# Patient Record
Sex: Female | Born: 1975
Health system: Southern US, Community
[De-identification: ages and names within clinical notes are randomized; demographics above are authoritative.]

## PROBLEM LIST (undated history)

## (undated) ENCOUNTER — Ambulatory Visit: Payer: No Typology Code available for payment source

## (undated) DIAGNOSIS — R569 Unspecified convulsions: Secondary | ICD-10-CM

## (undated) DIAGNOSIS — D509 Iron deficiency anemia, unspecified: Secondary | ICD-10-CM

## (undated) DIAGNOSIS — B373 Candidiasis of vulva and vagina: Secondary | ICD-10-CM

## (undated) DIAGNOSIS — Z872 Personal history of diseases of the skin and subcutaneous tissue: Secondary | ICD-10-CM

## (undated) DIAGNOSIS — J410 Simple chronic bronchitis: Secondary | ICD-10-CM

## (undated) DIAGNOSIS — Z8711 Personal history of peptic ulcer disease: Secondary | ICD-10-CM

## (undated) DIAGNOSIS — Z8619 Personal history of other infectious and parasitic diseases: Secondary | ICD-10-CM

## (undated) DIAGNOSIS — Z8679 Personal history of other diseases of the circulatory system: Secondary | ICD-10-CM

## (undated) DIAGNOSIS — A6000 Herpesviral infection of urogenital system, unspecified: Secondary | ICD-10-CM

## (undated) DIAGNOSIS — E785 Hyperlipidemia, unspecified: Secondary | ICD-10-CM

## (undated) DIAGNOSIS — N189 Chronic kidney disease, unspecified: Secondary | ICD-10-CM

## (undated) DIAGNOSIS — T4145XA Adverse effect of unspecified anesthetic, initial encounter: Secondary | ICD-10-CM

## (undated) DIAGNOSIS — T7840XA Allergy, unspecified, initial encounter: Secondary | ICD-10-CM

## (undated) DIAGNOSIS — Z8719 Personal history of other diseases of the digestive system: Secondary | ICD-10-CM

## (undated) DIAGNOSIS — G2581 Restless legs syndrome: Secondary | ICD-10-CM

## (undated) DIAGNOSIS — N201 Calculus of ureter: Secondary | ICD-10-CM

## (undated) DIAGNOSIS — R112 Nausea with vomiting, unspecified: Secondary | ICD-10-CM

## (undated) DIAGNOSIS — F429 Obsessive-compulsive disorder, unspecified: Secondary | ICD-10-CM

## (undated) DIAGNOSIS — F411 Generalized anxiety disorder: Secondary | ICD-10-CM

## (undated) DIAGNOSIS — K449 Diaphragmatic hernia without obstruction or gangrene: Secondary | ICD-10-CM

## (undated) DIAGNOSIS — Z923 Personal history of irradiation: Secondary | ICD-10-CM

## (undated) DIAGNOSIS — Z9889 Other specified postprocedural states: Secondary | ICD-10-CM

## (undated) DIAGNOSIS — Z87442 Personal history of urinary calculi: Secondary | ICD-10-CM

## (undated) DIAGNOSIS — E079 Disorder of thyroid, unspecified: Secondary | ICD-10-CM

## (undated) DIAGNOSIS — R7303 Prediabetes: Secondary | ICD-10-CM

## (undated) DIAGNOSIS — B3731 Acute candidiasis of vulva and vagina: Secondary | ICD-10-CM

## (undated) DIAGNOSIS — I10 Essential (primary) hypertension: Secondary | ICD-10-CM

## (undated) DIAGNOSIS — Z87410 Personal history of cervical dysplasia: Secondary | ICD-10-CM

## (undated) DIAGNOSIS — C801 Malignant (primary) neoplasm, unspecified: Secondary | ICD-10-CM

## (undated) DIAGNOSIS — F1021 Alcohol dependence, in remission: Secondary | ICD-10-CM

## (undated) DIAGNOSIS — E0500 Thyrotoxicosis with diffuse goiter without thyrotoxic crisis or storm: Secondary | ICD-10-CM

## (undated) DIAGNOSIS — K219 Gastro-esophageal reflux disease without esophagitis: Secondary | ICD-10-CM

## (undated) DIAGNOSIS — M1990 Unspecified osteoarthritis, unspecified site: Secondary | ICD-10-CM

## (undated) DIAGNOSIS — D689 Coagulation defect, unspecified: Secondary | ICD-10-CM

## (undated) DIAGNOSIS — F319 Bipolar disorder, unspecified: Secondary | ICD-10-CM

## (undated) DIAGNOSIS — Z8659 Personal history of other mental and behavioral disorders: Secondary | ICD-10-CM

## (undated) DIAGNOSIS — T8859XA Other complications of anesthesia, initial encounter: Secondary | ICD-10-CM

## (undated) DIAGNOSIS — R3915 Urgency of urination: Secondary | ICD-10-CM

## (undated) DIAGNOSIS — F32A Depression, unspecified: Secondary | ICD-10-CM

## (undated) DIAGNOSIS — F4310 Post-traumatic stress disorder, unspecified: Secondary | ICD-10-CM

## (undated) HISTORY — DX: Chronic kidney disease, unspecified: N18.9

## (undated) HISTORY — DX: Diaphragmatic hernia without obstruction or gangrene: K44.9

## (undated) HISTORY — DX: Adverse effect of unspecified anesthetic, initial encounter: T41.45XA

## (undated) HISTORY — DX: Coagulation defect, unspecified: D68.9

## (undated) HISTORY — PX: WISDOM TOOTH EXTRACTION: SHX21

## (undated) HISTORY — PX: KNEE ARTHROSCOPY: SHX127

## (undated) HISTORY — PX: LASER ABLATION OF THE CERVIX: SHX1949

## (undated) HISTORY — DX: Unspecified convulsions: R56.9

## (undated) HISTORY — DX: Other complications of anesthesia, initial encounter: T88.59XA

## (undated) HISTORY — DX: Allergy, unspecified, initial encounter: T78.40XA

## (undated) HISTORY — DX: Personal history of irradiation: Z92.3

## (undated) HISTORY — PX: TUBAL LIGATION: SHX77

## (undated) HISTORY — DX: Hyperlipidemia, unspecified: E78.5

## (undated) HISTORY — PX: COLONOSCOPY: SHX174

## (undated) HISTORY — PX: ESOPHAGOGASTRODUODENOSCOPY: SHX1529

## (undated) NOTE — *Deleted (*Deleted)
Radiation Oncology         [phone]) [phone] ________________________________  Name: ROSELL KHOURI MRN: [id number]  Date: 06/16/2020  DOB: [birth year]  Follow-Up Visit Note  CC: Myles Lipps, MD  Adolphus Birchwood, MD  No diagnosis found.  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  Radiation Treatment Dates: 03/16/2020 through 05/08/2020 Site Technique Total Dose (Gy) Dose per Fx (Gy) Completed Fx Beam Energies  Vulva: Pelvis IMRT 45/45 1.8 25/25 6X  Vulva: Pelvis_Bst IMRT 16.2/16.2 1.8 9/9 6X    Narrative:  The patient returns today for pelvic exam to evaluate ongoing vaginal discharge. She was seen in follow-up last week but did not have time to stay for a pelvic exam.  On review of systems, she reports ***. She denies ***.  ALLERGIES:  is allergic to depakote [divalproex sodium], minocycline, and aspirin.  Meds: Current Outpatient Medications  Medication Sig Dispense Refill  . acetaminophen (TYLENOL) 500 MG tablet Take 1,000 mg by mouth every 6 (six) hours as needed for moderate pain or fever.    . diphenoxylate-atropine (LOMOTIL) 2.5-0.025 MG tablet TAKE 1 TO 2 TABLETS BY MOUTH FOUR TIMES DAILY AS NEEDED FOR DIARRHEA 45 tablet 1  . dronabinol (MARINOL) 2.5 MG capsule Take 1 capsule (2.5 mg total) by mouth 2 (two) times daily before a meal. 60 capsule 0  . gabapentin (NEURONTIN) 300 MG capsule Take 1 capsule (300 mg total) by mouth daily. 90 capsule 3  . lidocaine (XYLOCAINE) 5 % ointment Apply 1 application topically 3 (three) times daily as needed. To affected area 35.44 g 0  . lidocaine-prilocaine (EMLA) cream Apply 1 application topically as needed (to port).    Marland Kitchen lisinopril-hydrochlorothiazide (ZESTORETIC) 20-12.5 MG tablet Take 1 tablet by mouth daily. 90 tablet 1  . LORazepam (ATIVAN) 1 MG tablet Take 1 tablet (1 mg total) by mouth 3 (three) times daily as  needed for anxiety. 90 tablet 2  . Multiple Vitamin (MULTIVITAMIN) tablet Take 1 tablet by mouth daily.    Marland Kitchen OLANZapine (ZYPREXA) 15 MG tablet Take 1 tablet (15 mg total) by mouth at bedtime. 90 tablet 0  . omeprazole (PRILOSEC) 40 MG capsule TAKE 1 CAPSULE BY MOUTH TWO TIMES DAILY BEFORE A MEAL 180 capsule 0  . potassium chloride SA (KLOR-CON) 20 MEQ tablet Take 1 tablet (20 mEq total) by mouth 2 (two) times daily. 60 tablet 2  . predniSONE (DELTASONE) 5 MG tablet Take 1 tablet (5 mg total) by mouth in the morning and at bedtime. Take with food 60 tablet 1  . Probiotic Product (PROBIOTIC DAILY PO) Take 1 tablet by mouth daily.     . promethazine (PHENERGAN) 25 MG tablet Take 1 tablet (25 mg total) by mouth every 6 (six) hours as needed for nausea. 30 tablet 3  . rivaroxaban (XARELTO) 20 MG TABS tablet Take 1 tablet (20 mg total) by mouth daily with supper for 7 days. 7 tablet 0  . topiramate (TOPAMAX) 25 MG tablet TAKE 1 TABLET BY MOUTH AT BEDTIME (Patient taking differently: Take 25 mg by mouth at bedtime. ) 90 tablet 0  . traZODone (DESYREL) 100 MG tablet Take 1 tablet (100 mg total) by mouth at bedtime. 90 tablet 0  . venlafaxine XR (EFFEXOR XR) 37.5 MG 24 hr capsule Take three capsule (112.5 mg total) by mouth daily. 90 capsule 2   No current facility-administered medications for this encounter.  Facility-Administered Medications Ordered in Other Encounters  Medication Dose Route Frequency Provider Last Rate Last Admin  . LORazepam (ATIVAN) injection 0.5 mg  0.5 mg Intravenous Once Ceasar Mons., PA-C        Physical Findings: The patient is in no acute distress. Patient is alert and oriented.  vitals were not taken for this visit.  No significant changes. Lungs are clear to auscultation bilaterally. Heart has regular rate and rhythm. No palpable cervical, supraclavicular, or axillary adenopathy. Abdomen soft, non-tender, normal bowel sounds. On pelvic examination the external  genitalia were unremarkable. A speculum exam was performed. There are no mucosal lesions noted in the vaginal vault. On bimanual and rectovaginal examination there were no pelvic masses appreciated. ***  Lab Findings: Lab Results  Component Value Date   WBC 6.1 06/04/2020   HGB 11.3 (L) 06/04/2020   HCT 33.5 (L) 06/04/2020   MCV 94.6 06/04/2020   PLT 261 06/04/2020    Radiographic Findings: No results found.  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 ***  Plan: The patient continues with neoadjuvant chemotherapy with TCH Perjeta x3 cycles under the care of Dr. Pamelia Hoit. Her next cycle begins on 06/19/2020. She will follow-up with Dr. Andrey Farmer in two months and with radiation oncology in *** months.  ____________________________________   Billie Lade, PhD, MD  This document serves as a record of services personally performed by Antony Blackbird, MD. It was created on his behalf by Nikki Dom, a trained medical scribe. The creation of this record is based on the scribe's personal observations and the provider's statements to them. This document has been checked and approved by the attending provider.

---

## 1998-05-04 ENCOUNTER — Encounter (INDEPENDENT_AMBULATORY_CARE_PROVIDER_SITE_OTHER): Payer: Self-pay | Admitting: Gastroenterology

## 1998-05-08 ENCOUNTER — Encounter (INDEPENDENT_AMBULATORY_CARE_PROVIDER_SITE_OTHER): Payer: Self-pay | Admitting: Gastroenterology

## 1998-05-08 ENCOUNTER — Other Ambulatory Visit: Admission: RE | Admit: 1998-05-08 | Discharge: 1998-05-08 | Payer: Self-pay | Admitting: Gastroenterology

## 2000-01-28 ENCOUNTER — Encounter: Payer: Self-pay | Admitting: *Deleted

## 2000-01-28 ENCOUNTER — Inpatient Hospital Stay (HOSPITAL_COMMUNITY): Admission: EM | Admit: 2000-01-28 | Discharge: 2000-01-28 | Payer: Self-pay | Admitting: *Deleted

## 2000-02-04 ENCOUNTER — Encounter: Payer: Self-pay | Admitting: Emergency Medicine

## 2000-02-04 ENCOUNTER — Emergency Department (HOSPITAL_COMMUNITY): Admission: EM | Admit: 2000-02-04 | Discharge: 2000-02-04 | Payer: Self-pay | Admitting: *Deleted

## 2000-02-13 ENCOUNTER — Emergency Department (HOSPITAL_COMMUNITY): Admission: EM | Admit: 2000-02-13 | Discharge: 2000-02-13 | Payer: Self-pay | Admitting: Emergency Medicine

## 2000-03-22 ENCOUNTER — Emergency Department (HOSPITAL_COMMUNITY): Admission: EM | Admit: 2000-03-22 | Discharge: 2000-03-22 | Payer: Self-pay

## 2000-07-23 ENCOUNTER — Inpatient Hospital Stay (HOSPITAL_COMMUNITY): Admission: AD | Admit: 2000-07-23 | Discharge: 2000-07-23 | Payer: Self-pay | Admitting: Obstetrics & Gynecology

## 2000-07-23 ENCOUNTER — Encounter: Payer: Self-pay | Admitting: Obstetrics & Gynecology

## 2000-12-21 ENCOUNTER — Emergency Department (HOSPITAL_COMMUNITY): Admission: EM | Admit: 2000-12-21 | Discharge: 2000-12-21 | Payer: Self-pay | Admitting: Emergency Medicine

## 2001-06-14 ENCOUNTER — Emergency Department (HOSPITAL_COMMUNITY): Admission: EM | Admit: 2001-06-14 | Discharge: 2001-06-14 | Payer: Self-pay | Admitting: Emergency Medicine

## 2001-06-14 ENCOUNTER — Encounter: Payer: Self-pay | Admitting: Emergency Medicine

## 2001-07-02 ENCOUNTER — Encounter: Payer: Self-pay | Admitting: Family Medicine

## 2001-07-02 ENCOUNTER — Ambulatory Visit (HOSPITAL_COMMUNITY): Admission: RE | Admit: 2001-07-02 | Discharge: 2001-07-02 | Payer: Self-pay | Admitting: Family Medicine

## 2002-02-06 ENCOUNTER — Emergency Department (HOSPITAL_COMMUNITY): Admission: EM | Admit: 2002-02-06 | Discharge: 2002-02-06 | Payer: Self-pay | Admitting: Emergency Medicine

## 2002-03-21 ENCOUNTER — Encounter: Payer: Self-pay | Admitting: Emergency Medicine

## 2002-03-21 ENCOUNTER — Emergency Department (HOSPITAL_COMMUNITY): Admission: EM | Admit: 2002-03-21 | Discharge: 2002-03-21 | Payer: Self-pay | Admitting: Emergency Medicine

## 2002-05-02 ENCOUNTER — Other Ambulatory Visit: Admission: RE | Admit: 2002-05-02 | Discharge: 2002-05-02 | Payer: Self-pay | Admitting: Family Medicine

## 2002-07-15 ENCOUNTER — Emergency Department (HOSPITAL_COMMUNITY): Admission: EM | Admit: 2002-07-15 | Discharge: 2002-07-16 | Payer: Self-pay | Admitting: *Deleted

## 2002-07-16 ENCOUNTER — Encounter: Payer: Self-pay | Admitting: *Deleted

## 2002-10-25 ENCOUNTER — Emergency Department (HOSPITAL_COMMUNITY): Admission: EM | Admit: 2002-10-25 | Discharge: 2002-10-25 | Payer: Self-pay | Admitting: Emergency Medicine

## 2003-01-14 ENCOUNTER — Ambulatory Visit: Admission: RE | Admit: 2003-01-14 | Discharge: 2003-01-14 | Payer: Self-pay | Admitting: Family Medicine

## 2003-01-14 ENCOUNTER — Encounter (INDEPENDENT_AMBULATORY_CARE_PROVIDER_SITE_OTHER): Payer: Self-pay | Admitting: Cardiology

## 2003-05-06 ENCOUNTER — Emergency Department (HOSPITAL_COMMUNITY): Admission: EM | Admit: 2003-05-06 | Discharge: 2003-05-06 | Payer: Self-pay | Admitting: Emergency Medicine

## 2003-10-26 ENCOUNTER — Emergency Department (HOSPITAL_COMMUNITY): Admission: EM | Admit: 2003-10-26 | Discharge: 2003-10-26 | Payer: Self-pay | Admitting: *Deleted

## 2003-12-27 ENCOUNTER — Emergency Department (HOSPITAL_COMMUNITY): Admission: EM | Admit: 2003-12-27 | Discharge: 2003-12-27 | Payer: Self-pay | Admitting: Family Medicine

## 2004-01-22 ENCOUNTER — Emergency Department (HOSPITAL_COMMUNITY): Admission: EM | Admit: 2004-01-22 | Discharge: 2004-01-22 | Payer: Self-pay | Admitting: Emergency Medicine

## 2004-02-17 ENCOUNTER — Inpatient Hospital Stay (HOSPITAL_COMMUNITY): Admission: RE | Admit: 2004-02-17 | Discharge: 2004-02-20 | Payer: Self-pay | Admitting: Psychiatry

## 2004-03-11 ENCOUNTER — Encounter: Admission: RE | Admit: 2004-03-11 | Discharge: 2004-03-11 | Payer: Self-pay | Admitting: Family Medicine

## 2004-05-24 ENCOUNTER — Ambulatory Visit: Payer: Self-pay | Admitting: Family Medicine

## 2004-06-14 ENCOUNTER — Ambulatory Visit: Payer: Self-pay | Admitting: Family Medicine

## 2004-06-14 ENCOUNTER — Other Ambulatory Visit: Admission: RE | Admit: 2004-06-14 | Discharge: 2004-06-14 | Payer: Self-pay | Admitting: Family Medicine

## 2004-08-05 ENCOUNTER — Ambulatory Visit: Payer: Self-pay | Admitting: Family Medicine

## 2004-12-14 ENCOUNTER — Emergency Department (HOSPITAL_COMMUNITY): Admission: EM | Admit: 2004-12-14 | Discharge: 2004-12-15 | Payer: Self-pay | Admitting: Emergency Medicine

## 2004-12-19 ENCOUNTER — Emergency Department (HOSPITAL_COMMUNITY): Admission: EM | Admit: 2004-12-19 | Discharge: 2004-12-19 | Payer: Self-pay | Admitting: Emergency Medicine

## 2004-12-21 ENCOUNTER — Ambulatory Visit: Payer: Self-pay | Admitting: Family Medicine

## 2005-02-04 ENCOUNTER — Ambulatory Visit: Payer: Self-pay | Admitting: Family Medicine

## 2005-02-09 ENCOUNTER — Emergency Department (HOSPITAL_COMMUNITY): Admission: EM | Admit: 2005-02-09 | Discharge: 2005-02-10 | Payer: Self-pay | Admitting: Emergency Medicine

## 2005-02-11 ENCOUNTER — Ambulatory Visit: Payer: Self-pay | Admitting: Family Medicine

## 2005-02-15 ENCOUNTER — Ambulatory Visit: Payer: Self-pay | Admitting: Psychiatry

## 2005-02-15 ENCOUNTER — Inpatient Hospital Stay (HOSPITAL_COMMUNITY): Admission: RE | Admit: 2005-02-15 | Discharge: 2005-02-19 | Payer: Self-pay | Admitting: Psychiatry

## 2005-03-14 ENCOUNTER — Ambulatory Visit: Payer: Self-pay | Admitting: Family Medicine

## 2005-04-25 ENCOUNTER — Ambulatory Visit: Payer: Self-pay | Admitting: Family Medicine

## 2005-05-13 ENCOUNTER — Ambulatory Visit: Payer: Self-pay | Admitting: Family Medicine

## 2005-05-16 ENCOUNTER — Emergency Department (HOSPITAL_COMMUNITY): Admission: EM | Admit: 2005-05-16 | Discharge: 2005-05-17 | Payer: Self-pay | Admitting: Emergency Medicine

## 2005-05-26 ENCOUNTER — Ambulatory Visit: Payer: Self-pay | Admitting: Family Medicine

## 2005-06-03 ENCOUNTER — Emergency Department (HOSPITAL_COMMUNITY): Admission: EM | Admit: 2005-06-03 | Discharge: 2005-06-03 | Payer: Self-pay | Admitting: Emergency Medicine

## 2005-06-20 ENCOUNTER — Emergency Department (HOSPITAL_COMMUNITY): Admission: EM | Admit: 2005-06-20 | Discharge: 2005-06-20 | Payer: Self-pay | Admitting: Emergency Medicine

## 2005-07-01 ENCOUNTER — Emergency Department (HOSPITAL_COMMUNITY): Admission: EM | Admit: 2005-07-01 | Discharge: 2005-07-02 | Payer: Self-pay | Admitting: Emergency Medicine

## 2005-07-06 ENCOUNTER — Ambulatory Visit: Payer: Self-pay | Admitting: Psychiatry

## 2005-07-06 ENCOUNTER — Inpatient Hospital Stay (HOSPITAL_COMMUNITY): Admission: EM | Admit: 2005-07-06 | Discharge: 2005-07-12 | Payer: Self-pay | Admitting: Psychiatry

## 2005-07-20 ENCOUNTER — Ambulatory Visit: Payer: Self-pay | Admitting: Family Medicine

## 2005-08-22 ENCOUNTER — Ambulatory Visit: Payer: Self-pay | Admitting: Family Medicine

## 2005-09-18 ENCOUNTER — Emergency Department (HOSPITAL_COMMUNITY): Admission: EM | Admit: 2005-09-18 | Discharge: 2005-09-18 | Payer: Self-pay | Admitting: Emergency Medicine

## 2005-09-26 ENCOUNTER — Emergency Department (HOSPITAL_COMMUNITY): Admission: EM | Admit: 2005-09-26 | Discharge: 2005-09-26 | Payer: Self-pay | Admitting: Emergency Medicine

## 2005-10-06 ENCOUNTER — Encounter (INDEPENDENT_AMBULATORY_CARE_PROVIDER_SITE_OTHER): Payer: Self-pay | Admitting: Family Medicine

## 2005-10-06 ENCOUNTER — Ambulatory Visit: Payer: Self-pay | Admitting: Family Medicine

## 2005-11-07 ENCOUNTER — Ambulatory Visit: Payer: Self-pay | Admitting: Family Medicine

## 2005-11-10 ENCOUNTER — Emergency Department (HOSPITAL_COMMUNITY): Admission: EM | Admit: 2005-11-10 | Discharge: 2005-11-10 | Payer: Self-pay | Admitting: Emergency Medicine

## 2005-12-03 ENCOUNTER — Emergency Department (HOSPITAL_COMMUNITY): Admission: EM | Admit: 2005-12-03 | Discharge: 2005-12-03 | Payer: Self-pay | Admitting: Emergency Medicine

## 2005-12-08 ENCOUNTER — Ambulatory Visit: Payer: Self-pay | Admitting: Family Medicine

## 2006-01-04 ENCOUNTER — Ambulatory Visit: Payer: Self-pay | Admitting: Family Medicine

## 2006-01-19 ENCOUNTER — Emergency Department (HOSPITAL_COMMUNITY): Admission: EM | Admit: 2006-01-19 | Discharge: 2006-01-19 | Payer: Self-pay | Admitting: Family Medicine

## 2006-02-22 ENCOUNTER — Ambulatory Visit: Payer: Self-pay | Admitting: Family Medicine

## 2006-03-20 ENCOUNTER — Ambulatory Visit: Payer: Self-pay | Admitting: Internal Medicine

## 2006-03-27 ENCOUNTER — Ambulatory Visit: Payer: Self-pay | Admitting: Family Medicine

## 2006-03-29 ENCOUNTER — Ambulatory Visit: Payer: Self-pay | Admitting: Family Medicine

## 2006-04-02 ENCOUNTER — Emergency Department (HOSPITAL_COMMUNITY): Admission: EM | Admit: 2006-04-02 | Discharge: 2006-04-03 | Payer: Self-pay | Admitting: Emergency Medicine

## 2006-05-19 HISTORY — PX: TRANSTHORACIC ECHOCARDIOGRAM: SHX275

## 2006-06-12 ENCOUNTER — Emergency Department (HOSPITAL_COMMUNITY): Admission: EM | Admit: 2006-06-12 | Discharge: 2006-06-12 | Payer: Self-pay | Admitting: Emergency Medicine

## 2006-06-20 ENCOUNTER — Ambulatory Visit: Payer: Self-pay | Admitting: Family Medicine

## 2006-07-14 ENCOUNTER — Ambulatory Visit: Payer: Self-pay | Admitting: Family Medicine

## 2006-08-09 ENCOUNTER — Emergency Department (HOSPITAL_COMMUNITY): Admission: EM | Admit: 2006-08-09 | Discharge: 2006-08-10 | Payer: Self-pay | Admitting: Emergency Medicine

## 2006-09-04 ENCOUNTER — Ambulatory Visit: Payer: Self-pay | Admitting: Family Medicine

## 2006-11-07 ENCOUNTER — Emergency Department (HOSPITAL_COMMUNITY): Admission: EM | Admit: 2006-11-07 | Discharge: 2006-11-07 | Payer: Self-pay | Admitting: Emergency Medicine

## 2006-12-28 ENCOUNTER — Ambulatory Visit: Payer: Self-pay | Admitting: Internal Medicine

## 2007-02-03 ENCOUNTER — Encounter (INDEPENDENT_AMBULATORY_CARE_PROVIDER_SITE_OTHER): Payer: Self-pay | Admitting: Family Medicine

## 2007-02-03 DIAGNOSIS — F1910 Other psychoactive substance abuse, uncomplicated: Secondary | ICD-10-CM | POA: Insufficient documentation

## 2007-02-03 DIAGNOSIS — Z8742 Personal history of other diseases of the female genital tract: Secondary | ICD-10-CM | POA: Insufficient documentation

## 2007-02-03 DIAGNOSIS — F17200 Nicotine dependence, unspecified, uncomplicated: Secondary | ICD-10-CM | POA: Insufficient documentation

## 2007-02-03 DIAGNOSIS — B009 Herpesviral infection, unspecified: Secondary | ICD-10-CM | POA: Insufficient documentation

## 2007-02-03 DIAGNOSIS — F319 Bipolar disorder, unspecified: Secondary | ICD-10-CM

## 2007-04-06 ENCOUNTER — Emergency Department (HOSPITAL_COMMUNITY): Admission: EM | Admit: 2007-04-06 | Discharge: 2007-04-06 | Payer: Self-pay | Admitting: Psychiatry

## 2007-04-16 ENCOUNTER — Emergency Department (HOSPITAL_COMMUNITY): Admission: EM | Admit: 2007-04-16 | Discharge: 2007-04-16 | Payer: Self-pay | Admitting: Emergency Medicine

## 2007-04-19 ENCOUNTER — Ambulatory Visit: Payer: Self-pay | Admitting: Internal Medicine

## 2007-05-21 ENCOUNTER — Emergency Department (HOSPITAL_COMMUNITY): Admission: EM | Admit: 2007-05-21 | Discharge: 2007-05-22 | Payer: Self-pay | Admitting: Emergency Medicine

## 2007-05-24 ENCOUNTER — Emergency Department (HOSPITAL_COMMUNITY): Admission: EM | Admit: 2007-05-24 | Discharge: 2007-05-24 | Payer: Self-pay | Admitting: Emergency Medicine

## 2007-05-26 ENCOUNTER — Emergency Department (HOSPITAL_COMMUNITY): Admission: EM | Admit: 2007-05-26 | Discharge: 2007-05-26 | Payer: Self-pay | Admitting: Family Medicine

## 2007-06-02 ENCOUNTER — Emergency Department (HOSPITAL_COMMUNITY): Admission: EM | Admit: 2007-06-02 | Discharge: 2007-06-02 | Payer: Self-pay | Admitting: Emergency Medicine

## 2007-06-11 ENCOUNTER — Ambulatory Visit: Payer: Self-pay | Admitting: Internal Medicine

## 2007-06-25 ENCOUNTER — Ambulatory Visit: Payer: Self-pay | Admitting: Family Medicine

## 2007-07-03 ENCOUNTER — Other Ambulatory Visit: Payer: Self-pay | Admitting: Emergency Medicine

## 2007-07-04 ENCOUNTER — Inpatient Hospital Stay (HOSPITAL_COMMUNITY): Admission: AD | Admit: 2007-07-04 | Discharge: 2007-07-09 | Payer: Self-pay | Admitting: Psychiatry

## 2007-07-04 ENCOUNTER — Ambulatory Visit: Payer: Self-pay | Admitting: Psychiatry

## 2007-07-14 ENCOUNTER — Emergency Department (HOSPITAL_COMMUNITY): Admission: EM | Admit: 2007-07-14 | Discharge: 2007-07-14 | Payer: Self-pay | Admitting: Emergency Medicine

## 2007-07-27 ENCOUNTER — Ambulatory Visit: Payer: Self-pay | Admitting: Family Medicine

## 2007-07-27 ENCOUNTER — Encounter (INDEPENDENT_AMBULATORY_CARE_PROVIDER_SITE_OTHER): Payer: Self-pay | Admitting: Family Medicine

## 2007-07-27 LAB — CONVERTED CEMR LAB
Chlamydia, DNA Probe: NEGATIVE
GC Probe Amp, Genital: NEGATIVE

## 2007-08-01 ENCOUNTER — Ambulatory Visit (HOSPITAL_COMMUNITY): Payer: Self-pay | Admitting: Psychiatry

## 2007-08-23 ENCOUNTER — Ambulatory Visit (HOSPITAL_COMMUNITY): Payer: Self-pay | Admitting: Licensed Clinical Social Worker

## 2007-08-24 ENCOUNTER — Ambulatory Visit (HOSPITAL_COMMUNITY): Payer: Self-pay | Admitting: Psychiatry

## 2007-09-11 ENCOUNTER — Ambulatory Visit (HOSPITAL_COMMUNITY): Payer: Self-pay | Admitting: Licensed Clinical Social Worker

## 2007-09-17 ENCOUNTER — Ambulatory Visit (HOSPITAL_COMMUNITY): Payer: Self-pay | Admitting: Psychiatry

## 2007-09-20 ENCOUNTER — Ambulatory Visit (HOSPITAL_COMMUNITY): Payer: Self-pay | Admitting: Licensed Clinical Social Worker

## 2007-09-26 ENCOUNTER — Emergency Department (HOSPITAL_COMMUNITY): Admission: EM | Admit: 2007-09-26 | Discharge: 2007-09-26 | Payer: Self-pay | Admitting: Emergency Medicine

## 2007-09-27 ENCOUNTER — Ambulatory Visit (HOSPITAL_COMMUNITY): Payer: Self-pay | Admitting: Licensed Clinical Social Worker

## 2007-10-04 ENCOUNTER — Ambulatory Visit (HOSPITAL_COMMUNITY): Payer: Self-pay | Admitting: Licensed Clinical Social Worker

## 2007-10-10 ENCOUNTER — Ambulatory Visit (HOSPITAL_COMMUNITY): Payer: Self-pay | Admitting: Psychiatry

## 2007-10-16 ENCOUNTER — Ambulatory Visit (HOSPITAL_COMMUNITY): Payer: Self-pay | Admitting: Licensed Clinical Social Worker

## 2007-10-23 ENCOUNTER — Ambulatory Visit: Payer: Self-pay | Admitting: Family Medicine

## 2007-10-24 ENCOUNTER — Emergency Department (HOSPITAL_COMMUNITY): Admission: EM | Admit: 2007-10-24 | Discharge: 2007-10-24 | Payer: Self-pay | Admitting: Emergency Medicine

## 2007-11-12 ENCOUNTER — Ambulatory Visit (HOSPITAL_COMMUNITY): Payer: Self-pay | Admitting: Licensed Clinical Social Worker

## 2007-11-14 ENCOUNTER — Other Ambulatory Visit (HOSPITAL_COMMUNITY): Admission: RE | Admit: 2007-11-14 | Discharge: 2007-11-23 | Payer: Self-pay | Admitting: Psychiatry

## 2007-11-16 ENCOUNTER — Ambulatory Visit: Payer: Self-pay | Admitting: Psychiatry

## 2007-12-04 ENCOUNTER — Ambulatory Visit (HOSPITAL_COMMUNITY): Payer: Self-pay | Admitting: Licensed Clinical Social Worker

## 2007-12-10 ENCOUNTER — Other Ambulatory Visit (HOSPITAL_COMMUNITY): Admission: RE | Admit: 2007-12-10 | Discharge: 2007-12-31 | Payer: Self-pay | Admitting: Psychiatry

## 2007-12-11 ENCOUNTER — Emergency Department (HOSPITAL_COMMUNITY): Admission: EM | Admit: 2007-12-11 | Discharge: 2007-12-11 | Payer: Self-pay | Admitting: Emergency Medicine

## 2007-12-13 ENCOUNTER — Ambulatory Visit: Payer: Self-pay | Admitting: Psychiatry

## 2007-12-20 ENCOUNTER — Ambulatory Visit (HOSPITAL_COMMUNITY): Payer: Self-pay | Admitting: Licensed Clinical Social Worker

## 2007-12-28 ENCOUNTER — Ambulatory Visit: Payer: Self-pay | Admitting: Internal Medicine

## 2007-12-31 ENCOUNTER — Ambulatory Visit: Payer: Self-pay | Admitting: Internal Medicine

## 2008-01-16 ENCOUNTER — Emergency Department (HOSPITAL_COMMUNITY): Admission: EM | Admit: 2008-01-16 | Discharge: 2008-01-16 | Payer: Self-pay | Admitting: Emergency Medicine

## 2008-01-30 ENCOUNTER — Ambulatory Visit (HOSPITAL_COMMUNITY): Payer: Self-pay | Admitting: Psychiatry

## 2008-02-07 ENCOUNTER — Ambulatory Visit: Payer: Self-pay | Admitting: Family Medicine

## 2008-02-12 ENCOUNTER — Ambulatory Visit (HOSPITAL_COMMUNITY): Payer: Self-pay | Admitting: Licensed Clinical Social Worker

## 2008-02-13 ENCOUNTER — Ambulatory Visit (HOSPITAL_COMMUNITY): Payer: Self-pay | Admitting: Psychiatry

## 2008-02-13 ENCOUNTER — Ambulatory Visit (HOSPITAL_COMMUNITY): Admission: RE | Admit: 2008-02-13 | Discharge: 2008-02-13 | Payer: Self-pay | Admitting: Family Medicine

## 2008-02-29 ENCOUNTER — Ambulatory Visit: Payer: Self-pay | Admitting: Family Medicine

## 2008-03-05 ENCOUNTER — Ambulatory Visit (HOSPITAL_COMMUNITY): Payer: Self-pay | Admitting: Psychiatry

## 2008-03-19 ENCOUNTER — Ambulatory Visit (HOSPITAL_COMMUNITY): Payer: Self-pay | Admitting: Licensed Clinical Social Worker

## 2008-03-25 ENCOUNTER — Ambulatory Visit (HOSPITAL_COMMUNITY): Payer: Self-pay | Admitting: Licensed Clinical Social Worker

## 2008-04-01 ENCOUNTER — Emergency Department (HOSPITAL_COMMUNITY): Admission: EM | Admit: 2008-04-01 | Discharge: 2008-04-01 | Payer: Self-pay | Admitting: Emergency Medicine

## 2008-04-03 ENCOUNTER — Ambulatory Visit (HOSPITAL_COMMUNITY): Payer: Self-pay | Admitting: Licensed Clinical Social Worker

## 2008-04-16 ENCOUNTER — Ambulatory Visit (HOSPITAL_COMMUNITY): Payer: Self-pay | Admitting: Psychiatry

## 2008-05-02 ENCOUNTER — Emergency Department (HOSPITAL_COMMUNITY): Admission: EM | Admit: 2008-05-02 | Discharge: 2008-05-02 | Payer: Self-pay | Admitting: Emergency Medicine

## 2008-05-07 ENCOUNTER — Emergency Department (HOSPITAL_COMMUNITY): Admission: EM | Admit: 2008-05-07 | Discharge: 2008-05-07 | Payer: Self-pay | Admitting: Emergency Medicine

## 2008-05-13 ENCOUNTER — Ambulatory Visit: Payer: Self-pay | Admitting: Internal Medicine

## 2008-05-13 ENCOUNTER — Ambulatory Visit (HOSPITAL_COMMUNITY): Payer: Self-pay | Admitting: Licensed Clinical Social Worker

## 2008-05-16 ENCOUNTER — Ambulatory Visit (HOSPITAL_COMMUNITY): Payer: Self-pay | Admitting: Psychiatry

## 2008-05-17 ENCOUNTER — Emergency Department (HOSPITAL_COMMUNITY): Admission: EM | Admit: 2008-05-17 | Discharge: 2008-05-17 | Payer: Self-pay | Admitting: Emergency Medicine

## 2008-06-13 ENCOUNTER — Ambulatory Visit: Payer: Self-pay | Admitting: Family Medicine

## 2008-06-13 LAB — CONVERTED CEMR LAB
ALT: 12 units/L (ref 0–35)
Albumin: 4.6 g/dL (ref 3.5–5.2)
Alkaline Phosphatase: 48 units/L (ref 39–117)
BUN: 7 mg/dL (ref 6–23)
Basophils Absolute: 0 10*3/uL (ref 0.0–0.1)
Calcium: 9.7 mg/dL (ref 8.4–10.5)
Chloride: 105 meq/L (ref 96–112)
Cholesterol: 197 mg/dL (ref 0–200)
Creatinine, Ser: 0.88 mg/dL (ref 0.40–1.20)
Free T4: 1.4 ng/dL (ref 0.89–1.80)
Glucose, Bld: 90 mg/dL (ref 70–99)
Hemoglobin: 13.7 g/dL (ref 12.0–15.0)
Lymphocytes Relative: 36 % (ref 12–46)
Magnesium: 1.8 mg/dL (ref 1.5–2.5)
Monocytes Relative: 5 % (ref 3–12)
Sodium: 138 meq/L (ref 135–145)
Total Bilirubin: 0.6 mg/dL (ref 0.3–1.2)
Total Protein: 7.2 g/dL (ref 6.0–8.3)
WBC: 10.9 10*3/uL — ABNORMAL HIGH (ref 4.0–10.5)

## 2008-06-16 ENCOUNTER — Ambulatory Visit (HOSPITAL_COMMUNITY): Admission: RE | Admit: 2008-06-16 | Discharge: 2008-06-16 | Payer: Self-pay | Admitting: Family Medicine

## 2008-06-22 ENCOUNTER — Emergency Department (HOSPITAL_COMMUNITY): Admission: EM | Admit: 2008-06-22 | Discharge: 2008-06-22 | Payer: Self-pay | Admitting: Emergency Medicine

## 2008-06-23 ENCOUNTER — Ambulatory Visit (HOSPITAL_COMMUNITY): Payer: Self-pay | Admitting: Licensed Clinical Social Worker

## 2008-06-25 ENCOUNTER — Ambulatory Visit (HOSPITAL_COMMUNITY): Payer: Self-pay | Admitting: Psychiatry

## 2008-07-07 ENCOUNTER — Ambulatory Visit (HOSPITAL_COMMUNITY): Payer: Self-pay | Admitting: Psychiatry

## 2008-07-08 ENCOUNTER — Ambulatory Visit (HOSPITAL_COMMUNITY): Payer: Self-pay | Admitting: Licensed Clinical Social Worker

## 2008-07-15 ENCOUNTER — Ambulatory Visit (HOSPITAL_COMMUNITY): Payer: Self-pay | Admitting: Licensed Clinical Social Worker

## 2008-07-21 ENCOUNTER — Emergency Department (HOSPITAL_COMMUNITY): Admission: EM | Admit: 2008-07-21 | Discharge: 2008-07-21 | Payer: Self-pay | Admitting: Family Medicine

## 2008-07-22 ENCOUNTER — Emergency Department (HOSPITAL_COMMUNITY): Admission: EM | Admit: 2008-07-22 | Discharge: 2008-07-22 | Payer: Self-pay | Admitting: Emergency Medicine

## 2008-07-22 ENCOUNTER — Encounter: Payer: Self-pay | Admitting: Obstetrics & Gynecology

## 2008-07-29 ENCOUNTER — Ambulatory Visit (HOSPITAL_COMMUNITY): Payer: Self-pay | Admitting: Licensed Clinical Social Worker

## 2008-08-05 ENCOUNTER — Ambulatory Visit (HOSPITAL_COMMUNITY): Payer: Self-pay | Admitting: Licensed Clinical Social Worker

## 2008-08-11 ENCOUNTER — Ambulatory Visit (HOSPITAL_COMMUNITY): Payer: Self-pay | Admitting: Psychiatry

## 2008-08-12 ENCOUNTER — Ambulatory Visit (HOSPITAL_COMMUNITY): Payer: Self-pay | Admitting: Licensed Clinical Social Worker

## 2008-08-18 ENCOUNTER — Ambulatory Visit (HOSPITAL_COMMUNITY): Payer: Self-pay | Admitting: Licensed Clinical Social Worker

## 2008-09-16 ENCOUNTER — Ambulatory Visit (HOSPITAL_COMMUNITY): Payer: Self-pay | Admitting: Licensed Clinical Social Worker

## 2008-10-12 ENCOUNTER — Emergency Department (HOSPITAL_COMMUNITY): Admission: EM | Admit: 2008-10-12 | Discharge: 2008-10-12 | Payer: Self-pay | Admitting: Emergency Medicine

## 2008-10-13 ENCOUNTER — Ambulatory Visit (HOSPITAL_COMMUNITY): Payer: Self-pay | Admitting: Psychiatry

## 2008-10-25 ENCOUNTER — Emergency Department (HOSPITAL_COMMUNITY): Admission: EM | Admit: 2008-10-25 | Discharge: 2008-10-25 | Payer: Self-pay | Admitting: Emergency Medicine

## 2008-10-27 ENCOUNTER — Ambulatory Visit (HOSPITAL_COMMUNITY): Payer: Self-pay | Admitting: Licensed Clinical Social Worker

## 2008-11-02 ENCOUNTER — Emergency Department (HOSPITAL_COMMUNITY): Admission: EM | Admit: 2008-11-02 | Discharge: 2008-11-02 | Payer: Self-pay | Admitting: Emergency Medicine

## 2008-11-03 ENCOUNTER — Inpatient Hospital Stay (HOSPITAL_COMMUNITY): Admission: AD | Admit: 2008-11-03 | Discharge: 2008-11-03 | Payer: Self-pay | Admitting: Obstetrics & Gynecology

## 2008-11-03 ENCOUNTER — Ambulatory Visit: Payer: Self-pay | Admitting: Family Medicine

## 2008-11-04 ENCOUNTER — Ambulatory Visit (HOSPITAL_COMMUNITY): Admission: RE | Admit: 2008-11-04 | Discharge: 2008-11-04 | Payer: Self-pay | Admitting: Internal Medicine

## 2008-11-10 ENCOUNTER — Encounter: Payer: Self-pay | Admitting: Internal Medicine

## 2008-11-10 ENCOUNTER — Ambulatory Visit: Payer: Self-pay | Admitting: Internal Medicine

## 2008-11-10 DIAGNOSIS — R109 Unspecified abdominal pain: Secondary | ICD-10-CM | POA: Insufficient documentation

## 2008-11-17 ENCOUNTER — Emergency Department (HOSPITAL_COMMUNITY): Admission: EM | Admit: 2008-11-17 | Discharge: 2008-11-18 | Payer: Self-pay | Admitting: Emergency Medicine

## 2008-11-25 ENCOUNTER — Ambulatory Visit (HOSPITAL_COMMUNITY): Payer: Self-pay | Admitting: Licensed Clinical Social Worker

## 2008-12-08 ENCOUNTER — Ambulatory Visit (HOSPITAL_COMMUNITY): Payer: Self-pay | Admitting: Psychiatry

## 2009-01-07 ENCOUNTER — Ambulatory Visit (HOSPITAL_COMMUNITY): Payer: Self-pay | Admitting: Psychiatry

## 2009-01-09 ENCOUNTER — Ambulatory Visit (HOSPITAL_COMMUNITY): Payer: Self-pay | Admitting: Licensed Clinical Social Worker

## 2009-01-13 ENCOUNTER — Ambulatory Visit: Payer: Self-pay | Admitting: Family Medicine

## 2009-01-20 ENCOUNTER — Ambulatory Visit (HOSPITAL_COMMUNITY): Payer: Self-pay | Admitting: Licensed Clinical Social Worker

## 2009-01-27 ENCOUNTER — Ambulatory Visit (HOSPITAL_COMMUNITY): Payer: Self-pay | Admitting: Licensed Clinical Social Worker

## 2009-02-03 ENCOUNTER — Emergency Department (HOSPITAL_COMMUNITY): Admission: EM | Admit: 2009-02-03 | Discharge: 2009-02-03 | Payer: Self-pay | Admitting: Emergency Medicine

## 2009-02-04 ENCOUNTER — Emergency Department (HOSPITAL_COMMUNITY): Admission: EM | Admit: 2009-02-04 | Discharge: 2009-02-04 | Payer: Self-pay | Admitting: Emergency Medicine

## 2009-02-05 ENCOUNTER — Ambulatory Visit (HOSPITAL_COMMUNITY): Payer: Self-pay | Admitting: Licensed Clinical Social Worker

## 2009-02-13 ENCOUNTER — Ambulatory Visit: Payer: Self-pay | Admitting: Family Medicine

## 2009-02-16 ENCOUNTER — Ambulatory Visit: Payer: Self-pay | Admitting: Internal Medicine

## 2009-02-23 ENCOUNTER — Ambulatory Visit (HOSPITAL_COMMUNITY): Payer: Self-pay | Admitting: Psychiatry

## 2009-03-08 ENCOUNTER — Emergency Department (HOSPITAL_COMMUNITY): Admission: EM | Admit: 2009-03-08 | Discharge: 2009-03-08 | Payer: Self-pay | Admitting: Emergency Medicine

## 2009-03-16 DIAGNOSIS — K449 Diaphragmatic hernia without obstruction or gangrene: Secondary | ICD-10-CM | POA: Insufficient documentation

## 2009-03-19 ENCOUNTER — Ambulatory Visit: Payer: Self-pay | Admitting: Gastroenterology

## 2009-03-19 DIAGNOSIS — K219 Gastro-esophageal reflux disease without esophagitis: Secondary | ICD-10-CM | POA: Insufficient documentation

## 2009-03-19 DIAGNOSIS — R1013 Epigastric pain: Secondary | ICD-10-CM | POA: Insufficient documentation

## 2009-03-19 DIAGNOSIS — Z8711 Personal history of peptic ulcer disease: Secondary | ICD-10-CM | POA: Insufficient documentation

## 2009-03-19 DIAGNOSIS — F1020 Alcohol dependence, uncomplicated: Secondary | ICD-10-CM | POA: Insufficient documentation

## 2009-03-19 DIAGNOSIS — K5900 Constipation, unspecified: Secondary | ICD-10-CM | POA: Insufficient documentation

## 2009-03-19 DIAGNOSIS — Z87442 Personal history of urinary calculi: Secondary | ICD-10-CM | POA: Insufficient documentation

## 2009-03-19 DIAGNOSIS — Z8679 Personal history of other diseases of the circulatory system: Secondary | ICD-10-CM | POA: Insufficient documentation

## 2009-03-19 DIAGNOSIS — F341 Dysthymic disorder: Secondary | ICD-10-CM | POA: Insufficient documentation

## 2009-03-19 LAB — CONVERTED CEMR LAB
Albumin: 3.9 g/dL (ref 3.5–5.2)
Alkaline Phosphatase: 47 units/L (ref 39–117)
Amylase: 121 units/L (ref 27–131)
BUN: 9 mg/dL (ref 6–23)
CO2: 28 meq/L (ref 19–32)
Eosinophils Absolute: 0.1 10*3/uL (ref 0.0–0.7)
Eosinophils Relative: 1.5 % (ref 0.0–5.0)
Hemoglobin: 13.7 g/dL (ref 12.0–15.0)
Iron: 100 ug/dL (ref 42–145)
Lipase: 19 units/L (ref 11.0–59.0)
Lymphs Abs: 2.6 10*3/uL (ref 0.7–4.0)
Monocytes Absolute: 0.7 10*3/uL (ref 0.1–1.0)
Monocytes Relative: 8.5 % (ref 3.0–12.0)
Neutro Abs: 4.5 10*3/uL (ref 1.4–7.7)
Potassium: 3.5 meq/L (ref 3.5–5.1)
RDW: 12.9 % (ref 11.5–14.6)
Sodium: 138 meq/L (ref 135–145)
TSH: 0.81 microintl units/mL (ref 0.35–5.50)
Vitamin B-12: 289 pg/mL (ref 211–911)

## 2009-03-20 ENCOUNTER — Telehealth (INDEPENDENT_AMBULATORY_CARE_PROVIDER_SITE_OTHER): Payer: Self-pay | Admitting: *Deleted

## 2009-03-20 ENCOUNTER — Encounter: Payer: Self-pay | Admitting: Gastroenterology

## 2009-03-20 DIAGNOSIS — Z8719 Personal history of other diseases of the digestive system: Secondary | ICD-10-CM | POA: Insufficient documentation

## 2009-03-24 ENCOUNTER — Telehealth: Payer: Self-pay | Admitting: Gastroenterology

## 2009-03-26 ENCOUNTER — Emergency Department (HOSPITAL_COMMUNITY): Admission: EM | Admit: 2009-03-26 | Discharge: 2009-03-26 | Payer: Self-pay | Admitting: Emergency Medicine

## 2009-04-06 ENCOUNTER — Encounter: Payer: Self-pay | Admitting: Gastroenterology

## 2009-04-07 ENCOUNTER — Inpatient Hospital Stay (HOSPITAL_COMMUNITY): Admission: AD | Admit: 2009-04-07 | Discharge: 2009-04-07 | Payer: Self-pay | Admitting: Obstetrics & Gynecology

## 2009-04-21 ENCOUNTER — Emergency Department (HOSPITAL_COMMUNITY): Admission: EM | Admit: 2009-04-21 | Discharge: 2009-04-21 | Payer: Self-pay | Admitting: Family Medicine

## 2009-05-22 ENCOUNTER — Ambulatory Visit (HOSPITAL_COMMUNITY): Payer: Self-pay | Admitting: Psychiatry

## 2009-06-05 ENCOUNTER — Emergency Department (HOSPITAL_COMMUNITY): Admission: EM | Admit: 2009-06-05 | Discharge: 2009-06-05 | Payer: Self-pay | Admitting: Emergency Medicine

## 2009-06-05 ENCOUNTER — Encounter: Payer: Self-pay | Admitting: Gastroenterology

## 2009-06-10 ENCOUNTER — Ambulatory Visit (HOSPITAL_COMMUNITY): Payer: Self-pay | Admitting: Psychiatry

## 2009-06-25 ENCOUNTER — Emergency Department (HOSPITAL_COMMUNITY): Admission: EM | Admit: 2009-06-25 | Discharge: 2009-06-25 | Payer: Self-pay | Admitting: Emergency Medicine

## 2009-07-23 ENCOUNTER — Ambulatory Visit: Payer: Self-pay | Admitting: Family Medicine

## 2009-07-29 ENCOUNTER — Ambulatory Visit (HOSPITAL_COMMUNITY): Payer: Self-pay | Admitting: Psychiatry

## 2009-09-21 ENCOUNTER — Inpatient Hospital Stay (HOSPITAL_COMMUNITY): Admission: AD | Admit: 2009-09-21 | Discharge: 2009-09-21 | Payer: Self-pay | Admitting: Family Medicine

## 2009-09-21 ENCOUNTER — Ambulatory Visit (HOSPITAL_COMMUNITY): Payer: Self-pay | Admitting: Psychiatry

## 2009-11-11 ENCOUNTER — Ambulatory Visit (HOSPITAL_COMMUNITY): Payer: Self-pay | Admitting: Psychiatry

## 2009-11-25 ENCOUNTER — Telehealth (INDEPENDENT_AMBULATORY_CARE_PROVIDER_SITE_OTHER): Payer: Self-pay | Admitting: Family Medicine

## 2009-11-25 ENCOUNTER — Ambulatory Visit: Payer: Self-pay | Admitting: Family Medicine

## 2009-11-25 ENCOUNTER — Encounter (INDEPENDENT_AMBULATORY_CARE_PROVIDER_SITE_OTHER): Payer: Self-pay | Admitting: Family Medicine

## 2009-12-02 ENCOUNTER — Ambulatory Visit: Payer: Self-pay | Admitting: Obstetrics and Gynecology

## 2009-12-02 ENCOUNTER — Ambulatory Visit: Payer: Self-pay | Admitting: Internal Medicine

## 2009-12-02 ENCOUNTER — Encounter (INDEPENDENT_AMBULATORY_CARE_PROVIDER_SITE_OTHER): Payer: Self-pay | Admitting: Family Medicine

## 2009-12-02 LAB — CONVERTED CEMR LAB
Barbiturate Quant, Ur: NEGATIVE
Marijuana Metabolite: NEGATIVE
Methadone: NEGATIVE
Propoxyphene: NEGATIVE
TSH: 0.609 microintl units/mL (ref 0.350–4.500)

## 2009-12-03 ENCOUNTER — Ambulatory Visit: Payer: Self-pay | Admitting: Family Medicine

## 2009-12-03 LAB — CONVERTED CEMR LAB
BUN: 10 mg/dL (ref 6–23)
Chloride: 102 meq/L (ref 96–112)
Potassium: 3.9 meq/L (ref 3.5–5.3)

## 2009-12-17 ENCOUNTER — Ambulatory Visit: Payer: Self-pay | Admitting: Family Medicine

## 2010-01-01 ENCOUNTER — Ambulatory Visit (HOSPITAL_COMMUNITY): Payer: Self-pay | Admitting: Psychiatry

## 2010-01-26 ENCOUNTER — Ambulatory Visit: Payer: Self-pay | Admitting: Family Medicine

## 2010-02-09 ENCOUNTER — Ambulatory Visit: Payer: Self-pay | Admitting: Family Medicine

## 2010-02-18 ENCOUNTER — Ambulatory Visit (HOSPITAL_COMMUNITY): Admission: RE | Admit: 2010-02-18 | Discharge: 2010-02-18 | Payer: Self-pay | Admitting: Family Medicine

## 2010-02-19 ENCOUNTER — Ambulatory Visit (HOSPITAL_COMMUNITY): Payer: Self-pay | Admitting: Psychiatry

## 2010-03-02 ENCOUNTER — Ambulatory Visit: Payer: Self-pay | Admitting: Gastroenterology

## 2010-03-02 ENCOUNTER — Encounter (INDEPENDENT_AMBULATORY_CARE_PROVIDER_SITE_OTHER): Payer: Self-pay | Admitting: *Deleted

## 2010-03-04 ENCOUNTER — Ambulatory Visit: Payer: Self-pay | Admitting: Obstetrics and Gynecology

## 2010-03-15 ENCOUNTER — Ambulatory Visit: Payer: Self-pay | Admitting: Gastroenterology

## 2010-03-18 ENCOUNTER — Other Ambulatory Visit: Admission: RE | Admit: 2010-03-18 | Discharge: 2010-03-18 | Payer: Self-pay | Admitting: Obstetrics and Gynecology

## 2010-03-18 ENCOUNTER — Ambulatory Visit: Payer: Self-pay | Admitting: Obstetrics & Gynecology

## 2010-03-29 ENCOUNTER — Ambulatory Visit: Payer: Self-pay | Admitting: Obstetrics and Gynecology

## 2010-04-01 ENCOUNTER — Ambulatory Visit (HOSPITAL_COMMUNITY): Admission: RE | Admit: 2010-04-01 | Discharge: 2010-04-01 | Payer: Self-pay | Admitting: Obstetrics & Gynecology

## 2010-04-01 ENCOUNTER — Ambulatory Visit: Payer: Self-pay | Admitting: Obstetrics & Gynecology

## 2010-04-01 HISTORY — PX: ENDOMETRIAL ABLATION W/ NOVASURE: SUR434

## 2010-04-08 ENCOUNTER — Ambulatory Visit: Payer: Self-pay | Admitting: Family Medicine

## 2010-04-08 LAB — CONVERTED CEMR LAB
Chloride: 103 meq/L (ref 96–112)
Creatinine, Ser: 0.72 mg/dL (ref 0.40–1.20)
Potassium: 3.9 meq/L (ref 3.5–5.3)
Vit D, 25-Hydroxy: 40 ng/mL (ref 30–89)

## 2010-04-21 ENCOUNTER — Ambulatory Visit (HOSPITAL_COMMUNITY): Payer: Self-pay | Admitting: Psychiatry

## 2010-05-20 ENCOUNTER — Emergency Department (HOSPITAL_COMMUNITY): Admission: EM | Admit: 2010-05-20 | Discharge: 2010-05-20 | Payer: Self-pay | Admitting: Emergency Medicine

## 2010-06-14 ENCOUNTER — Ambulatory Visit (HOSPITAL_COMMUNITY): Payer: Self-pay | Admitting: Psychiatry

## 2010-08-03 ENCOUNTER — Emergency Department (HOSPITAL_COMMUNITY)
Admission: EM | Admit: 2010-08-03 | Discharge: 2010-08-03 | Disposition: A | Discharge status: Other Acute Inpatient Hospital | Payer: Self-pay | Source: Home / Self Care | Admitting: Emergency Medicine

## 2010-08-03 ENCOUNTER — Emergency Department (HOSPITAL_COMMUNITY)
Admission: EM | Admit: 2010-08-03 | Discharge: 2010-08-03 | Payer: Self-pay | Source: Home / Self Care | Admitting: Emergency Medicine

## 2010-08-04 ENCOUNTER — Emergency Department (HOSPITAL_COMMUNITY)
Admission: EM | Admit: 2010-08-04 | Discharge: 2010-08-04 | Payer: Self-pay | Source: Home / Self Care | Admitting: Emergency Medicine

## 2010-08-25 ENCOUNTER — Ambulatory Visit (HOSPITAL_COMMUNITY): Payer: Self-pay | Admitting: Psychiatry

## 2010-08-31 ENCOUNTER — Encounter (INDEPENDENT_AMBULATORY_CARE_PROVIDER_SITE_OTHER): Payer: Self-pay | Admitting: Family Medicine

## 2010-08-31 LAB — CONVERTED CEMR LAB
BUN: 12 mg/dL (ref 6–23)
CO2: 29 meq/L (ref 19–32)
Calcium: 10 mg/dL (ref 8.4–10.5)
Creatinine, Ser: 0.89 mg/dL (ref 0.40–1.20)

## 2010-09-01 ENCOUNTER — Ambulatory Visit (HOSPITAL_COMMUNITY): Payer: Self-pay | Admitting: Psychiatry

## 2010-09-21 ENCOUNTER — Ambulatory Visit (HOSPITAL_COMMUNITY): Admit: 2010-09-21 | Payer: Self-pay | Admitting: Licensed Clinical Social Worker

## 2010-09-26 ENCOUNTER — Encounter: Payer: Self-pay | Admitting: Family Medicine

## 2010-09-26 ENCOUNTER — Encounter: Payer: Self-pay | Admitting: *Deleted

## 2010-10-05 NOTE — Letter (Signed)
Summary: [facility] Instructions  [facility]  [address] [place], [place] [address]   Phone: [phone]  Fax: [phone]       Sandra Miller    [birth year]    MRN: [id number]        Procedure Day /Date: Monday, 03/15/10     Arrival Time: 12:30      Procedure Time: 1:30     Location of Procedure:                    [facility]  [facility] (4th Floor)                        PREPARATION FOR COLONOSCOPY WITH MOVIPREP   Starting 5 days prior to your procedure 03/10/10 do not eat nuts, seeds, popcorn, corn, beans, peas,  salads, or any raw vegetables.  Do not take any fiber supplements (e.g. Metamucil, Citrucel, and Benefiber).  THE DAY BEFORE YOUR PROCEDURE         DATE: 03/14/10    DAY: Thursday  1.  Drink clear liquids the entire day-NO SOLID FOOD  2.  Do not drink anything colored red or purple.  Avoid juices with pulp.  No orange juice.  3.  Drink at least 64 oz. (8 glasses) of fluid/clear liquids during the day to prevent dehydration and help the prep work efficiently.  CLEAR LIQUIDS INCLUDE: Water Jello Ice Popsicles Tea (sugar ok, no milk/cream) Powdered fruit flavored drinks Coffee (sugar ok, no milk/cream) Gatorade Juice: apple, white grape, white cranberry  Lemonade Clear bullion, consomm, broth Carbonated beverages (any kind) Strained chicken noodle soup Hard Candy                             4.  In the morning, mix first dose of MoviPrep solution:    Empty 1 Pouch A and 1 Pouch B into the disposable container    Add lukewarm drinking water to the top line of the container. Mix to dissolve    Refrigerate (mixed solution should be used within 24 hrs)  5.  Begin drinking the prep at 5:00 p.m. The MoviPrep container is divided by 4 marks.   Every 15 minutes drink the solution down to the next mark (approximately 8 oz) until the full liter is complete.   6.  Follow completed prep with 16 oz of clear liquid of your choice (Nothing  red or purple).  Continue to drink clear liquids until bedtime.  7.  Before going to bed, mix second dose of MoviPrep solution:    Empty 1 Pouch A and 1 Pouch B into the disposable container    Add lukewarm drinking water to the top line of the container. Mix to dissolve    Refrigerate  THE DAY OF YOUR PROCEDURE      DATE: 03/15/10   DAY: Friday  Beginning at 8:00 a.m. (5 hours before procedure):         1. Every 15 minutes, drink the solution down to the next mark (approx 8 oz) until the full liter is complete.  2. Follow completed prep with 16 oz. of clear liquid of your choice.    3. You may drink clear liquids until 11:30 (2 HOURS BEFORE PROCEDURE).   MEDICATION INSTRUCTIONS  Unless otherwise instructed, you should take regular prescription medications with a small sip of water   as early as possible the  morning of your procedure.                  OTHER INSTRUCTIONS  You will need a responsible adult at least 37 years of age to accompany you and drive you home.   This person must remain in the waiting room during your procedure.  Wear loose fitting clothing that is easily removed.  Leave jewelry and other valuables at home.  However, you may wish to bring a book to read or  an iPod/MP3 player to listen to music as you wait for your procedure to start.  Remove all body piercing jewelry and leave at home.  Total time from sign-in until discharge is approximately 2-3 hours.  You should go home directly after your procedure and rest.  You can resume normal activities the  day after your procedure.  The day of your procedure you should not:   Drive   Make legal decisions   Operate machinery   Drink alcohol   Return to work  You will receive specific instructions about eating, activities and medications before you leave.    The above instructions have been reviewed and explained to me by   _______________________    I fully understand and can  verbalize these instructions _____________________________ Date _________

## 2010-10-05 NOTE — Procedures (Signed)
Summary: Colonoscopy   Procedures Next Due Date:    Colonoscopy: 03/2015 

## 2010-10-05 NOTE — Procedures (Signed)
Summary: Colonoscopy  Patient: Sandra Miller Note: All result statuses are Final unless otherwise noted.  Tests: (1) Colonoscopy (COL)   COL Colonoscopy           DONE     [facility]     [address]     [place], [place]  [address]           COLONOSCOPY PROCEDURE REPORT           PATIENT:  Sandra, Miller  MR#:  [id number]     BIRTHDATE:  1976-01-09, 34 yrs. old  GENDER:  female     ENDOSCOPIST:  Vania Rea. Jarold Motto, MD, [facility]     REF. BY:     PROCEDURE DATE:  03/15/2010     PROCEDURE:  Surveillance Colonoscopy     ASA CLASS:  Class II     INDICATIONS:  Elevated Risk Screening, family history of colon     cancer     MEDICATIONS:   Fentanyl 100 mcg IV, Versed 10 mg IV           DESCRIPTION OF PROCEDURE:   After the risks benefits and     alternatives of the procedure were thoroughly explained, informed     consent was obtained.  Digital rectal exam was performed and     revealed no abnormalities.   The LB [id number] endoscope     was introduced through the anus and advanced to the cecum, which     was identified by both the appendix and ileocecal valve, without     limitations.  The quality of the prep was excellent, using     MoviPrep.  The instrument was then slowly withdrawn as the colon     was fully examined.     <<PROCEDUREIMAGES>>           FINDINGS:  ULTRASONIC FINDINGS:  A sessile polyp was found in the     sigmoid colon. -3 mm flat polyp cold snare excised.no tissue     retrieved. This was otherwise a normal examination of the colon.     Retroflexed views in the rectum revealed no abnormalities.    The     scope was then withdrawn from the patient and the procedure     completed.           COMPLICATIONS:  None     ENDOSCOPIC IMPRESSION:     1) Sessile polyp in the sigmoid colon     2) Otherwise normal examination     ++ polyp and ++FH     RECOMMENDATIONS:     1) Follow up colonoscopy in 5 years     REPEAT EXAM:  No        ______________________________     Vania Rea. Jarold Motto, MD, Clement[facility] Graham           CC:  Dow Adolph, MD           n.     Rosalie Doctor:   Vania Rea. Yohan Samons at 03/15/2010 02:04 PM           Lanya, [place], [id number]  Note: An exclamation mark (!) indicates a result that was not dispersed into the flowsheet. Document Creation Date: 03/15/2010 2:04 PM _______________________________________________________________________  (1) Order result status: Final Collection or observation date-time: 03/15/2010 13:58 Requested date-time:  Receipt date-time:  Reported date-time:  Referring Physician:   Ordering Physician: Sheryn Bison [phone]) Specimen Source:  Source: Launa Grill Order Number: [id number] Lab site:

## 2010-10-05 NOTE — Assessment & Plan Note (Signed)
Summary: DYSPHAGIA WITH FOOD, PILLS...AS.   History of Present Illness Visit Type: Follow-up Visit Primary GI MD: Sheryn Bison MD FACP FAGA Primary Provider: Dow Adolph, MD Requesting Provider: n/a Chief Complaint: Dysphagia with food and pills, and vomiting  History of Present Illness:   This 35 year old African American female has a long history of alcoholism and marijuana abuse. Previous scheduling for endoscopy and colonoscopy was not completed because of her marked apprehension about propofol administration. She apparently had been off of all drugs and alcohol for at least 60 days.  She continues with rather severe acid reflux symptoms with regurgitation and burning substernal chest pain despite use of Prilosec 20 mg daily and p.r.n. Carafate suspension. She also describes dysphagia for solid foods with nausea and vomiting. She is on multiple medications including trazodone and, Ativan, and Zyprexa. She denies any specific A. Biller complaints or lower GI problems but does have a strong family history of colon cancer and polyps in her brother. She denies hematochezia, melena, et Karie Soda. She does have bipolar disorder and is under psychiatric care.   GI Review of Systems    Reports dysphagia with solids and  vomiting.      Denies abdominal pain, acid reflux, belching, bloating, chest pain, dysphagia with liquids, heartburn, loss of appetite, nausea, vomiting blood, weight loss, and  weight gain.        Denies anal fissure, black tarry stools, change in bowel habit, constipation, diarrhea, diverticulosis, fecal incontinence, heme positive stool, hemorrhoids, irritable bowel syndrome, jaundice, light color stool, liver problems, rectal bleeding, and  rectal pain.    Current Medications (verified): 1)  Trazodone Hcl 150 Mg Tabs (Trazodone Hcl) .... Two Tablet By Mouth At Bedtime 2)  Inderal  Tabs (Propranolol Hcl Tabs) 20 Mg .Marland Kitchen.. 30 Mg-Two Times A Day /40mg -- At Bedtime 3)   Prilosec 20 Mg Cpdr (Omeprazole) .Marland Kitchen.. 1 Capsule By Mouth Q 12 Hours 4)  Ativan 1 Mg Tabs (Lorazepam) .... Take 1 By Mouth As Needed 5)  Multivitamins  Tabs (Multiple Vitamin) .Marland Kitchen.. 1 Tablet By Mouth Once Daily 6)  Vitamins C E 500-400 Mg-Unit Caps (Vitamins C E) .Marland Kitchen.. 1 Capsule By Mouth Once Daily 7)  Carafate 1 Gm/14ml  Susp (Sucralfate) .... Take 10 Cc By Mouth As Needed 8)  Zyprexa 5 Mg Tabs (Olanzapine) .... One Tablet By Mouth At Bedtime  Allergies (verified): 1)  ! Aspirin  Past History:  Past medical, surgical, family and social histories (including risk factors) reviewed for relevance to current acute and chronic problems.  Past Medical History: Reviewed history from 03/19/2009 and no changes required. Current Problems:  GERD (ICD-530.81) GASTRIC ULCER, HX OF (ICD-V12.71) HYPERTENSION NEC (ICD-997.91) NEPHROLITHIASIS, HX OF (ICD-V13.01) ANXIETY DEPRESSION (ICD-300.4) ALCOHOLISM (ICD-303.90) HIATAL HERNIA WITH REFLUX (ICD-553.3) ABDOMINAL PAIN, UNSPECIFIED SITE (ICD-789.00) DISORDER, TOBACCO USE (ICD-305.1) HSV (ICD-054.9) Hx of HX, PERSONAL, GENITAL/OBSTETRIC DISORDER NEC (ICD-V13.29) Hx of SUBSTANCE ABUSE (ICD-305.90) DISORDER, BIPOLAR NOS (ICD-296.80)  Past Surgical History: Reviewed history from 03/16/2009 and no changes required. Caesarean section times one (with one NSVD) Tubal ligation (1995) left knee arthoscopic surgery laser surgery to cervix  Family History: Reviewed history from 03/19/2009 and no changes required. No FH of Colon Cancer: Family History of Stomach Cancer: paternal GF Family History of Diabetes: Maternal GF, Paternal GM Family History of Heart Disease: father, paternal grandparent  Family History of Colon Polyps:brother  Social History: Reviewed history from 03/19/2009 and no changes required. Patient currently smokes.  one PPD Alcohol Use - 5+ per day Illicit  Drug Use - Marijuana Occupation: unemployed Single   1 boy and 1  girl  Review of Systems       The patient complains of arthritis/joint pain and fatigue.  The patient denies allergy/sinus, anemia, anxiety-new, back pain, blood in urine, breast changes/lumps, change in vision, confusion, cough, coughing up blood, depression-new, fainting, fever, headaches-new, hearing problems, heart murmur, heart rhythm changes, itching, menstrual pain, muscle pains/cramps, night sweats, nosebleeds, pregnancy symptoms, shortness of breath, skin rash, sleeping problems, sore throat, swelling of feet/legs, swollen lymph glands, thirst - excessive , urination - excessive , urination changes/pain, urine leakage, vision changes, and voice change.    Vital Signs:  Patient profile:   35 year old female Height:      66 inches Weight:      189 pounds BMI:     30.62 BSA:     1.95 Pulse rate:   88 / minute Pulse rhythm:   regular BP sitting:   110 / 76  (left arm) Cuff size:   regular  Vitals Entered By: Ok Anis CMA (March 02, 2010 10:41 AM)  Physical Exam  General:  Well developed, well nourished, no acute distress.healthy appearing.   Head:  Normocephalic and atraumatic. Eyes:  PERRLA, no icterus.exam deferred to patient's ophthalmologist.   Mouth:  No deformity or lesions, dentition normal. Neck:  Supple; no masses or thyromegaly. Lungs:  Clear throughout to auscultation. Heart:  Regular rate and rhythm; no murmurs, rubs,  or bruits. Abdomen:  Soft, nontender and nondistended. No masses, hepatosplenomegaly or hernias noted. Normal bowel sounds. Rectal:  deferred until time of colonoscopy.   Msk:  Symmetrical with no gross deformities. Normal posture. Pulses:  Normal pulses noted. Extremities:  No clubbing, cyanosis, edema or deformities noted. Neurologic:  Alert and  oriented x4;  grossly normal neurologically. Cervical Nodes:  No significant cervical adenopathy. Psych:  Alert and cooperative. Normal mood and affect.   Impression & Recommendations:  Problem #  1:  GERD (ICD-530.81) Assessment Deteriorated Worsening GERD with associated solid food dysphasia suggesting peptic stricture of her esophagus. I have changed her to Dexilant 2 mg a day with strict antireflux maneuvers. Endoscopy and esophageal dilatation had been scheduled.  Problem # 2:  FAMILY HX COLON CANCER (ICD-V16.0) Assessment: Unchanged outpatient Colonoscopy has been scheduled at her convenience at the time of endoscopy. This patient does not want propofol administration.  Problem # 3:  CONSTIPATION (ICD-564.00) Assessment: Improved Fiber supplements and probiotics recommended.  Problem # 4:  ALCOHOLISM (ICD-303.90) Assessment: Improved Continued attendance at rehabilitation meetings and counseling sessions. Previous CDT level was markedly elevated, but I cannot say with this patient actually had acute pancreatitis. She is currently on trazodone, Ativan, and Zyprexa.  Patient Instructions: 1)  Stop Prilosec.  Begin Dexilant once daily. 2)  You are scheduled for an endoscopy and colonoscopy. 3)  The medication list was reviewed and reconciled.  All changed / newly prescribed medications were explained.  A complete medication list was provided to the patient / caregiver. 4)  Copy sent to : Dr. Dow Adolph 5)  Please continue current medications.  6)  Avoid foods high in acid content ( tomatoes, citrus juices, spicy foods) . Avoid eating within 3 to 4 hours of lying down or before exercising. Do not over eat; try smaller more frequent meals. Elevate head of bed four inches when sleeping.  7)  Constipation and Hemorrhoids brochure given.  8)  Colonoscopy and Flexible Sigmoidoscopy brochure given.  9)  Conscious Sedation  brochure given.  10)  Upper Endoscopy with Dilatation brochure given.   Appended Document: DYSPHAGIA WITH FOOD, PILLS...AS.    Clinical Lists Changes  Medications: Added new medication of MOVIPREP 100 GM  SOLR (PEG-KCL-NACL-NASULF-NA ASC-C) As per prep  instructions. - Signed Added new medication of DEXILANT 60 MG CPDR (DEXLANSOPRAZOLE) 1 by mouth qd - Signed Removed medication of PRILOSEC 20 MG CPDR (OMEPRAZOLE) 1 capsule by mouth q 12 hours Rx of MOVIPREP 100 GM  SOLR (PEG-KCL-NACL-NASULF-NA ASC-C) As per prep instructions.;  #1 x 0;  Signed;  Entered by: Ashok Cordia RN;  Authorized by: Mardella Layman MD [facility];  Method used: Electronically to [facility] [address] # [id number]*, [address] [place], [place], [place]  [address], Ph: [phone] or [phone], Fax: [phone] Rx of DEXILANT 60 MG CPDR (DEXLANSOPRAZOLE) 1 by mouth qd;  #30 x 6;  Signed;  Entered by: Ashok Cordia RN;  Authorized by: Mardella Layman MD [facility];  Method used: Electronically to [facility] [address] # [id number]*, [address] [place], [place], [place]  [address], Ph: [phone] or [phone], Fax: [phone] Orders: Added new Test order of Colon/Endo (Colon/Endo) - Signed    Prescriptions: DEXILANT 60 MG CPDR (DEXLANSOPRAZOLE) 1 by mouth qd  #30 x 6   Entered by:   Ashok Cordia RN   Authorized by:   Mardella Layman MD [facility]   Signed by:   Ashok Cordia RN on 03/02/2010   Method used:   Electronically to        [facility]  [id number]* (retail)       [address]       [place], [place]  [address]       Ph: [phone] or [phone]       Fax: [phone]   RxID:   [id number] MOVIPREP 100 GM  SOLR (PEG-KCL-NACL-NASULF-NA ASC-C) As per prep instructions.  #1 x 0   Entered by:   Ashok Cordia RN   Authorized by:   Mardella Layman MD [facility]   Signed by:   Ashok Cordia RN on 03/02/2010   Method used:   Electronically to        [facility] [address] [id number]* (retail)       [address]       [place], [place]  [address]       Ph: [phone] or [phone]       Fax: [phone]   RxID:   [id number]

## 2010-10-05 NOTE — Procedures (Signed)
Summary: Upper Endoscopy  Patient: Sandra Miller Note: All result statuses are Final unless otherwise noted.  Tests: (1) Upper Endoscopy (EGD)   EGD Upper Endoscopy       DONE     [facility]     [address]     [place], [place]  [address]           ENDOSCOPY PROCEDURE REPORT           PATIENT:  [name], [name]  MR#:  [id number]     BIRTHDATE:  1975/09/11, 34 yrs. old  GENDER:  female           ENDOSCOPIST:  Vania Rea. Jarold Motto, MD, [facility]     Referred by:           PROCEDURE DATE:  03/15/2010     PROCEDURE:  EGD, diagnostic     ASA CLASS:  Class II     INDICATIONS:  REFRACTORY REFLUX SYMPTOMS DESPITE PPI RX.           MEDICATIONS:   There was residual sedation effect present from     prior procedure., Fentanyl 50 mcg IV, Versed 2 mg IV 100 MCG OF     FENTANYL AND 10 MG OF IV VERSED 20 MTS EARLIER FOR COLONOSCOPY     GIVEN.     TOPICAL ANESTHETIC:  none           DESCRIPTION OF PROCEDURE:   After the risks benefits and     alternatives of the procedure were thoroughly explained, informed     consent was obtained.  The LB GIF-H180 [id number] endoscope was     introduced through the mouth and advanced to the , limited by     retching and gagging. NEEDS PROPOFOL FOR SEDATION IN FUTURE.NOT A     CANDIDATE FOR COVSCIOUS SEDATION AND ENDOSCOPY.  The instrument     was slowly withdrawn as the mucosa was fully examined.     <<PROCEDUREIMAGES>>           A hiatal hernia was found. FREE REFLUX AND 5 CM HH.ESOPHAGITIS AT     GE JUNCTION.NO STRICTURE OR BARRETT'S MUCOSA.    UNABLE TO DO PER     UNCO-OPERATIVENESS.  The scope was then withdrawn from the patient     and the procedure completed.           COMPLICATIONS:  None           ENDOSCOPIC IMPRESSION:     1) Hiatal hernia     CHRONIC GERD     RECOMMENDATIONS:     1.DEXILANT 60 MG/DAY     2.AVOID ETOH AND DRUGS     3.WEIGHT LOSS     4.CONTINUE REHABILITATION ATTEMPTS     5.POOR SURGICAL CANDIDATE     6.REFLUX  REGIME           REPEAT EXAM:  No           ______________________________     Vania Rea. Jarold Motto, MD, [facility]           CC:           n.     eSIGNED:   Vania Rea. Patterson at 03/15/2010 02:19 PM           Kess, [place], [phone]  Note: An exclamation mark (!) indicates a result that was not dispersed into the flowsheet. Document Creation Date: 03/15/2010 2:20 PM _______________________________________________________________________  (1) Order result status: Final Collection or  observation date-time: 03/15/2010 14:09 Requested date-time:  Receipt date-time:  Reported date-time:  Referring Physician:   Ordering Physician: Sheryn Bison [phone]) Specimen Source:  Source: Launa Grill Order Number: [id number] Lab site:

## 2010-10-05 NOTE — Progress Notes (Signed)
Summary: poss. strep throat  Phone Note Call from Patient   Summary of Call: complains of feeling like "throat is on fire" child in the home has strep throat and she had eatened after him. Does not know if she has a fever but doesn't feel well. Acute visit scheduled for today Initial call taken by: Gaylyn Cheers RN,  November 25, 2009 12:58 PM

## 2010-10-05 NOTE — Procedures (Signed)
Summary: EGD

## 2010-10-27 ENCOUNTER — Encounter (HOSPITAL_COMMUNITY): Payer: Self-pay | Admitting: Psychiatry

## 2010-11-03 ENCOUNTER — Encounter (HOSPITAL_COMMUNITY): Payer: Medicare Other | Admitting: Psychiatry

## 2010-11-03 DIAGNOSIS — F3189 Other bipolar disorder: Secondary | ICD-10-CM

## 2010-11-09 ENCOUNTER — Ambulatory Visit (INDEPENDENT_AMBULATORY_CARE_PROVIDER_SITE_OTHER): Payer: Medicare Other

## 2010-11-09 ENCOUNTER — Inpatient Hospital Stay (INDEPENDENT_AMBULATORY_CARE_PROVIDER_SITE_OTHER)
Admission: RE | Admit: 2010-11-09 | Discharge: 2010-11-09 | Disposition: A | Discharge status: Home / Self Care | Payer: Medicare Other | Source: Ambulatory Visit | Attending: Emergency Medicine | Admitting: Emergency Medicine

## 2010-11-09 DIAGNOSIS — R209 Unspecified disturbances of skin sensation: Secondary | ICD-10-CM

## 2010-11-09 DIAGNOSIS — M47812 Spondylosis without myelopathy or radiculopathy, cervical region: Secondary | ICD-10-CM

## 2010-11-09 DIAGNOSIS — I10 Essential (primary) hypertension: Secondary | ICD-10-CM

## 2010-11-10 ENCOUNTER — Ambulatory Visit (HOSPITAL_COMMUNITY): Payer: Medicaid Other | Admitting: Psychology

## 2010-11-16 LAB — URINE CULTURE
Colony Count: 85000
Culture  Setup Time: 201111292203

## 2010-11-16 LAB — POCT I-STAT, CHEM 8
Calcium, Ion: 1.16 mmol/L (ref 1.12–1.32)
Glucose, Bld: 98 mg/dL (ref 70–99)
HCT: 43 % (ref 36.0–46.0)
Hemoglobin: 14.6 g/dL (ref 12.0–15.0)
Potassium: 3.3 mEq/L — ABNORMAL LOW (ref 3.5–5.1)

## 2010-11-16 LAB — WET PREP, GENITAL: Trich, Wet Prep: NONE SEEN

## 2010-11-16 LAB — GC/CHLAMYDIA PROBE AMP, GENITAL: GC Probe Amp, Genital: NEGATIVE

## 2010-11-16 LAB — POCT URINALYSIS DIPSTICK
Bilirubin Urine: NEGATIVE
Glucose, UA: NEGATIVE mg/dL
Ketones, ur: NEGATIVE mg/dL
Specific Gravity, Urine: 1.015 (ref 1.005–1.030)

## 2010-11-16 LAB — URINALYSIS, ROUTINE W REFLEX MICROSCOPIC
Bilirubin Urine: NEGATIVE
Glucose, UA: NEGATIVE mg/dL
Protein, ur: NEGATIVE mg/dL
Urobilinogen, UA: 0.2 mg/dL (ref 0.0–1.0)

## 2010-11-16 LAB — URINE MICROSCOPIC-ADD ON

## 2010-11-18 LAB — RPR: RPR Ser Ql: NONREACTIVE

## 2010-11-18 LAB — WET PREP, GENITAL

## 2010-11-20 LAB — CBC
HCT: 38.5 % (ref 36.0–46.0)
Hemoglobin: 13.1 g/dL (ref 12.0–15.0)
WBC: 8.4 10*3/uL (ref 4.0–10.5)

## 2010-11-21 LAB — URINALYSIS, ROUTINE W REFLEX MICROSCOPIC
Nitrite: NEGATIVE
Specific Gravity, Urine: 1.015 (ref 1.005–1.030)
Urobilinogen, UA: 0.2 mg/dL (ref 0.0–1.0)

## 2010-11-21 LAB — DIFFERENTIAL
Eosinophils Absolute: 0.1 10*3/uL (ref 0.0–0.7)
Lymphocytes Relative: 46 % (ref 12–46)
Lymphs Abs: 4.3 10*3/uL — ABNORMAL HIGH (ref 0.7–4.0)
Monocytes Relative: 7 % (ref 3–12)
Neutrophils Relative %: 46 % (ref 43–77)

## 2010-11-21 LAB — COMPREHENSIVE METABOLIC PANEL
ALT: 13 U/L (ref 0–35)
AST: 16 U/L (ref 0–37)
Calcium: 9.2 mg/dL (ref 8.4–10.5)
GFR calc Af Amer: >60 mL/min (ref >60)
Glucose, Bld: 88 mg/dL (ref 70–99)
Sodium: 138 mEq/L (ref 135–145)
Total Protein: 7.2 g/dL (ref 6.0–8.3)

## 2010-11-21 LAB — CBC
MCHC: 33.5 g/dL (ref 30.0–36.0)
RDW: 13.2 % (ref 11.5–15.5)

## 2010-11-21 LAB — GC/CHLAMYDIA PROBE AMP, GENITAL: Chlamydia, DNA Probe: NEGATIVE

## 2010-11-21 LAB — WET PREP, GENITAL
Clue Cells Wet Prep HPF POC: NONE SEEN
Trich, Wet Prep: NONE SEEN
Yeast Wet Prep HPF POC: NONE SEEN

## 2010-11-21 LAB — POCT PREGNANCY, URINE
Preg Test, Ur: NEGATIVE
Preg Test, Ur: NEGATIVE

## 2010-11-21 LAB — URINE MICROSCOPIC-ADD ON

## 2010-11-21 LAB — URINE CULTURE: Colony Count: NO GROWTH

## 2010-11-26 LAB — POCT URINALYSIS DIP (DEVICE)
Bilirubin Urine: NEGATIVE
Hgb urine dipstick: NEGATIVE
Ketones, ur: NEGATIVE mg/dL
Specific Gravity, Urine: <=1.005 (ref 1.005–1.030)
pH: 5 (ref 5.0–8.0)

## 2010-12-09 LAB — URINALYSIS, ROUTINE W REFLEX MICROSCOPIC
Glucose, UA: NEGATIVE mg/dL
Glucose, UA: NEGATIVE mg/dL
Hgb urine dipstick: NEGATIVE
Ketones, ur: NEGATIVE mg/dL
Protein, ur: NEGATIVE mg/dL
Specific Gravity, Urine: 1.015 (ref 1.005–1.030)
pH: 6 (ref 5.0–8.0)
pH: 6 (ref 5.0–8.0)

## 2010-12-09 LAB — URINE MICROSCOPIC-ADD ON

## 2010-12-11 LAB — URINALYSIS, ROUTINE W REFLEX MICROSCOPIC
Glucose, UA: NEGATIVE mg/dL
Ketones, ur: NEGATIVE mg/dL
pH: 5.5 (ref 5.0–8.0)

## 2010-12-11 LAB — URINE MICROSCOPIC-ADD ON

## 2010-12-11 LAB — WET PREP, GENITAL

## 2010-12-11 LAB — CULTURE, ROUTINE-ABSCESS

## 2010-12-12 LAB — URINE MICROSCOPIC-ADD ON

## 2010-12-12 LAB — URINALYSIS, ROUTINE W REFLEX MICROSCOPIC
Bilirubin Urine: NEGATIVE
Glucose, UA: NEGATIVE mg/dL
Ketones, ur: 15 mg/dL — AB
Leukocytes, UA: NEGATIVE
Nitrite: NEGATIVE
Protein, ur: 100 mg/dL — AB
Specific Gravity, Urine: 1.018 (ref 1.005–1.030)
Urobilinogen, UA: 0.2 mg/dL (ref 0.0–1.0)
pH: 5.5 (ref 5.0–8.0)

## 2010-12-12 LAB — BASIC METABOLIC PANEL WITH GFR
BUN: 6 mg/dL (ref 6–23)
CO2: 22 meq/L (ref 19–32)
Calcium: 9.6 mg/dL (ref 8.4–10.5)
Chloride: 103 meq/L (ref 96–112)
Creatinine, Ser: 0.83 mg/dL (ref 0.4–1.2)
GFR calc non Af Amer: >60 mL/min
Glucose, Bld: 113 mg/dL — ABNORMAL HIGH (ref 70–99)
Potassium: 3.6 meq/L (ref 3.5–5.1)
Sodium: 139 meq/L (ref 135–145)

## 2010-12-12 LAB — RAPID URINE DRUG SCREEN, HOSP PERFORMED
Amphetamines: NOT DETECTED
Barbiturates: NOT DETECTED
Benzodiazepines: POSITIVE — AB

## 2010-12-12 LAB — POCT PREGNANCY, URINE: Preg Test, Ur: NEGATIVE

## 2010-12-16 LAB — URINALYSIS, ROUTINE W REFLEX MICROSCOPIC
Ketones, ur: 40 mg/dL — AB
Leukocytes, UA: NEGATIVE
Nitrite: NEGATIVE
Nitrite: NEGATIVE
Protein, ur: 30 mg/dL — AB
Protein, ur: NEGATIVE mg/dL
Urobilinogen, UA: 0.2 mg/dL (ref 0.0–1.0)
pH: 6 (ref 5.0–8.0)
pH: 6.5 (ref 5.0–8.0)

## 2010-12-16 LAB — WET PREP, GENITAL
Clue Cells Wet Prep HPF POC: NONE SEEN
Trich, Wet Prep: NONE SEEN
Yeast Wet Prep HPF POC: NONE SEEN

## 2010-12-16 LAB — CBC
HCT: 37.9 % (ref 36.0–46.0)
HCT: 42.4 % (ref 36.0–46.0)
MCHC: 33.1 g/dL (ref 30.0–36.0)
MCV: 90.7 fL (ref 78.0–100.0)
Platelets: 309 10*3/uL (ref 150–400)
Platelets: 321 10*3/uL (ref 150–400)
RDW: 14.1 % (ref 11.5–15.5)
RDW: 14.6 % (ref 11.5–15.5)

## 2010-12-16 LAB — POCT I-STAT, CHEM 8
Glucose, Bld: 115 mg/dL — ABNORMAL HIGH (ref 70–99)
HCT: 46 % (ref 36.0–46.0)
Hemoglobin: 15.6 g/dL — ABNORMAL HIGH (ref 12.0–15.0)
Potassium: 3.1 mEq/L — ABNORMAL LOW (ref 3.5–5.1)

## 2010-12-16 LAB — POCT PREGNANCY, URINE: Preg Test, Ur: NEGATIVE

## 2010-12-16 LAB — POCT CARDIAC MARKERS
CKMB, poc: <1 ng/mL — ABNORMAL LOW (ref 1.0–8.0)
CKMB, poc: <1 ng/mL — ABNORMAL LOW (ref 1.0–8.0)
Troponin i, poc: <0.05 ng/mL (ref 0.00–0.09)

## 2010-12-16 LAB — DIFFERENTIAL
Basophils Absolute: 0 10*3/uL (ref 0.0–0.1)
Eosinophils Relative: 1 % (ref 0–5)
Lymphocytes Relative: 27 % (ref 12–46)
Neutrophils Relative %: 66 % (ref 43–77)

## 2010-12-16 LAB — RAPID URINE DRUG SCREEN, HOSP PERFORMED: Cocaine: NOT DETECTED

## 2010-12-21 LAB — URINALYSIS, ROUTINE W REFLEX MICROSCOPIC
Leukocytes, UA: NEGATIVE
Protein, ur: 100 mg/dL — AB
Urobilinogen, UA: 0.2 mg/dL (ref 0.0–1.0)

## 2010-12-21 LAB — COMPREHENSIVE METABOLIC PANEL
Alkaline Phosphatase: 54 U/L (ref 39–117)
BUN: 10 mg/dL (ref 6–23)
Chloride: 107 mEq/L (ref 96–112)
GFR calc non Af Amer: >60 mL/min (ref >60)
Glucose, Bld: 88 mg/dL (ref 70–99)
Potassium: 3.4 mEq/L — ABNORMAL LOW (ref 3.5–5.1)
Total Bilirubin: 0.7 mg/dL (ref 0.3–1.2)

## 2010-12-21 LAB — DIFFERENTIAL
Basophils Absolute: 0.1 10*3/uL (ref 0.0–0.1)
Basophils Relative: 0 % (ref 0–1)
Neutro Abs: 13.1 10*3/uL — ABNORMAL HIGH (ref 1.7–7.7)
Neutrophils Relative %: 76 % (ref 43–77)

## 2010-12-21 LAB — URINE MICROSCOPIC-ADD ON

## 2010-12-21 LAB — ETHANOL: Alcohol, Ethyl (B): 17 mg/dL — ABNORMAL HIGH (ref 0–10)

## 2010-12-21 LAB — CBC
HCT: 43.5 % (ref 36.0–46.0)
Hemoglobin: 14.7 g/dL (ref 12.0–15.0)
WBC: 17.2 10*3/uL — ABNORMAL HIGH (ref 4.0–10.5)

## 2010-12-29 ENCOUNTER — Encounter (HOSPITAL_COMMUNITY): Payer: Medicaid Other | Admitting: Psychiatry

## 2010-12-29 ENCOUNTER — Encounter (INDEPENDENT_AMBULATORY_CARE_PROVIDER_SITE_OTHER): Payer: Medicaid Other | Admitting: Psychiatry

## 2010-12-29 DIAGNOSIS — F3189 Other bipolar disorder: Secondary | ICD-10-CM

## 2010-12-31 ENCOUNTER — Ambulatory Visit (HOSPITAL_COMMUNITY): Payer: Medicare Other | Admitting: Psychology
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
## 2011-01-18 NOTE — Assessment & Plan Note (Signed)
NAME:  Sandra Miller, Sandra Miller               ACCOUNT NO.:  [id number]   MEDICAL RECORD NO.:  [id number]          PATIENT TYPE:  WOC   LOCATION:  [facility]         FACILITY:  [facility]   PHYSICIAN:  Wynelle Bourgeois, CNM    DATE OF BIRTH:  [birth year]   DATE OF SERVICE:  12/02/2009                                  CLINIC NOTE   This is a 35 year old gravida 2, para 2 who is not pregnant, who  presents for evaluation of menorrhagia and breast pain.  She states that  she has periods every 3 weeks and bleeds heavily for the first 4 days  requiring tampon plus pad every 30 minutes.  Total cycle length is 7  days.  She states that she has had heavy periods for 18 years.  She also  reports bilateral breast pain for 1-2 years.  She had a mammogram in  November 2010 that was normal and states that she has a history of a  breast mass that turned out to be an abscess and was drained sometime  last year.  She was seen in the [facility] back in January for some left lower  back pain.  She states that she was told she had kidney stones; however,  the CT and ultrasound were negative for any findings in the pelvis for  urinary tract, everything was negative.  Her hemoglobin at that time was  13.1.  She states she was placed on Cleocin for a sore throat even  though she tested negative for strep.  She does report having vaginal  itching.  She states she has a history of abnormal Pap with cryosurgery  done years ago but had a negative gonorrhea, chlamydia in January.  Her  menstrual history is remarkable for age of onset of menses at 39.  She  reports bleeding every 21-25 days with periods lasting 7 days and report  as heavy.  OB history is remarkable for two pregnancies, one of which  was a C-section, the last being in 1995.  She had her tubes tied after  that delivery.   SURGICAL HISTORY:  Remarkable for C-section in 1995, knee surgery, and  wisdom teeth.   MEDICAL HISTORY:  Remarkable for history of kidney  stones, history of  gastric ulcer, recent onset of high blood pressure, and also cancer  cells on her cervix.  She also has a history of generalized anxiety  disorder and substance abuse.  She does report being free of alcohol or  marijuana for the last month.   FAMILY HISTORY:  Remarkable for diabetes, heart disease, high blood  pressure, and cancer.   SOCIAL HISTORY:  The patient lives with her 2 children.  She smokes one  pack per day.  She drinks 5 drinks per day 3 days a week and uses  marijuana.   REVIEW OF SYSTEMS:  The patient reports bilateral breast tenderness and  also reports monthly episodes of menorrhagia.  She also reports vaginal  discharge with itching.   ALLERGIES:  ASPIRIN.   Last menstrual period was November 12, 2009.   OBJECTIVE DATA:  VITAL SIGNS:  Temperature 98.4, pulse 82, blood  pressure 156/100 and 163/99, weight  188.9, height 66 inches.  ABDOMEN:  Soft and nontender without masses.  BREASTS:  Soft and nontender.  There are no masses appreciated.  There  is some small amount of scar tissue around the biopsy site on the right  breast just lateral to the nipple, but there is no discrete mass  palpated at this time.  There is no discharge from the nipples and no  skin discoloration and breasts are symmetrical.  PELVIC:  Vagina is clear, pink with normal rugae.  There is a thick curd  like white discharge in the vagina.  Cervix is multiparous, closed,  long.  Pap smears obtained.  Uterus is small with no masses.  There are  no adnexal masses appreciated.  GC and Chlamydia were negative in  January.  The patient reports abstinent since then.  Urinalysis was  negative.   ASSESSMENT:  48. A 35 year old gravida 2, para 2 who presents with long history of      menorrhagia but has a normal hemoglobin.  2. Mastodynia probably related to cycles without any evidence of      pathology in a normal recent mammogram.  3. Generalized anxiety disorder.  4. Chronic  hypertension.   PLAN:  1. Offered the patient repeat mammogram or breast ultrasound and the      patient declines.  She states she will follow up with her normal      schedule mammogram as scheduled and if she has further recurrences,      she will follow with her primary doctor.  2. Discussed menorrhagia.  Discussed possible options for evaluating      it which include repeating her pelvic ultrasound to measure her      endometrial stripe.  Endometrial biopsy was discussed with the      patient but is not performed today secondary to the patient's      heightened anxiety related to her elevation in blood pressures.  I      did discuss with her that we may need to do this endometrial biopsy      on her next visit if her endometrial lining is thickened at all and      that it is the only way to a assure presence or absence of      endometrial dysplasia or cancer.  We also will check a TSH today.      She just recently had a CBC that was normal, so we are deferring      that test today.  We will bring her back in 2-3 weeks to discuss      the ultrasound and TSH findings at which time we will possibly need      to do an endometrial biopsy.  3. Discussed possible treatments of menorrhagia including Depo-      Provera, which she does not want to use secondary to risk of weight      gain.  We also discussed contraindication to using oral      contraceptives because of her age and her smoking status.  The next      treatment that we discussed was use of a Mirena IUD, which the      patient is interested in.  We will give her literature on that      today and she will let us know on her next visit if she wants to      proceed with that, at which time we can place that for treatment of  her menorrhagia.  Other options including ablation and hysterectomy      are not necessary at this time and would only be considered if we      are unable to control her symptoms with the Mirena IUD.  4.  Prescription for Diflucan 150 mg p.o. x1 was given for treatment of      her vaginal candidiasis.  5. Pap done.           ______________________________  Wynelle Bourgeois, CNM     MW/MEDQ  D:  12/02/2009  T:  12/03/2009  Job:  [id number]

## 2011-01-18 NOTE — Discharge Summary (Addendum)
NAME:  Sandra Miller, Sandra Miller               ACCOUNT NO.:  [id number]   MEDICAL RECORD NO.:  [id number]          PATIENT TYPE:  IPS   LOCATION:  0505                          [facility]ILITY[facility]  [facility]   PHYSICIAN:  Geoffery Lyons, M.D.      DATE OF BIRTH:  03/17/1976   DATE OF ADMISSION:  07/04/2007  DATE OF DISCHARGE:  07/09/2007                               DISCHARGE SUMMARY   __________ .  Third admission __________  to give her help for these.  __________  female with a past history __________ .  __________ feeling  miserable.  __________  has been __________ .   PAST MEDICAL HISTORY:  History of __________ .  Gastritis.   MEDICATIONS:  __________  at bedtime, Inderal 10 mg per day, Protonix 40  per day, __________ .   __________ .   __________ .   The __________  who __________ .  __________ .   __________ .  marijuana abuse.  __________   __________  was admitted.  Positive __________  .  __________  on  Neurontin and Zyprexa, __________  in October and November __________  cancer __________  .  __________  at a time.  Depression __________  ,for the last 2 days, __________  .  __________  six times in the last  month.  __________  was in court for __________ .  She was released on  the first __________  and __________ .   PAST PSYCHIATRIC HISTORY:  __________  has been __________  Center.  __________  medications __________  .  She has been on Seroquel,  Abilify, Risperdal, Depakote, Neurontin, Lexapro, Inderal, Paxil,  __________  disorders.  __________  for __________ .   ____ QA MARKER: 377 ____:  ___   __________ .  She is also going to work on being more active __________  .  __________ .  __________ .  __________ .   DISCHARGE MEDICATIONS:  __________ .  Neurontin __________ , Zyprexa  __________ three times a day, __________  day, __________  a day.  __________      Geoffery Lyons, M.D.  Electronically Signed    IL/MEDQ  D:  07/30/2007  T:  07/31/2007  Job:  [id number]

## 2011-01-21 NOTE — H&P (Signed)
NAME:  Sandra Miller, Sandra Miller                         ACCOUNT NO.:  [id number]   MEDICAL RECORD NO.:  [id number]                   PATIENT TYPE:  IPS   LOCATION:  0501                                 [facility]ILITY[facility]   PHYSICIAN:  Jeanice Lim, M.D.              DATE OF BIRTH:  Apr 07, 1976   DATE OF ADMISSION:  02/17/2004  DATE OF DISCHARGE:                         PSYCHIATRIC ADMISSION ASSESSMENT   IDENTIFYING INFORMATION:  The patient is a 35 year old single female  voluntarily admitted on February 17, 2004.   HISTORY OF PRESENT ILLNESS:  The patient presents with a history of alcohol  abuse.  She has been drinking for the past one and a half years, drinking  daily up to eight to ten 24 ounce beers and smoking marijuana.  The  patient's last drink was on February 16, 2004.  The patient feels depressed,  angry.  She has been crying but denying any suicidal ideation.  The patient  is having episodes of feeling angry at people with no particular reason.  Her stressors are that the patient is a single mom and her father recently  died in 2022/10/15 and she states she just has not coped well with his death.   PAST PSYCHIATRIC HISTORY:  This is the first hospitalization at [facility].  She was an outpatient at [facility].   SUBSTANCE ABUSE HISTORY:  The patient states she drinks beer all day,  drinking up to eight to ten 24 ounce beers at a time.  She denies any  seizures.   PAST MEDICAL HISTORY:  Primary care Serai Tukes: Unknown.  Medical problems:  The patient reports a history of cervical cancer.   MEDICATIONS:  She has been on Effexor and Zyprexa but has been off her  medications for at least eight months and is uncertain of the dosages.   DRUG ALLERGIES:  No known allergies.   PHYSICAL EXAMINATION:  GENERAL:  The patient was assessed at [facility],  which was reviewed.  The patient is having some mild upper extremity  tremors.  VITAL SIGNS:  Temperature 99.3,  heart rate 86, respirations 16, blood  pressure 127/87.  She is 5 feet 6 inches tall, 150 pounds.   LABORATORY DATA:  Urine drug screen was positive for barbiturates, positive  for THC.  Urine pregnancy test was negative.  Urinalysis was negative.  WBC  count is elevated at 16.5.  Potassium is 3.2.  Liver enzymes are pending.   SOCIAL HISTORY:  This is a 35 year old single female.  She has two children  ages 88 and 41.  The children are currently with the patient's mother.  Otherwise, the patient resides with the children.  She has no employment and  no criminal charges.   FAMILY HISTORY:  Mother with depression.   MENTAL STATUS EXAM:  She is an alert, young female, cooperative with good  eye contact, casually dressed.  Speech is clear.  The patient feels very  angry.  The patient is somewhat constricted.  Thought processes are coherent  and logical.  Cognitive functioning: Intact.  Memory is good.  Judgment and  insight are fair.  Poor impulse control.   ADMISSION DIAGNOSES:   AXIS I:  1. Alcohol abuse, rule out dependence.  2. Tetrahydrocannabinol abuse.   AXIS II:  Deferred.   AXIS III:  1. Kidney stones.  2. Cervical cancer.   AXIS IV:  Other psychosocial problems related to alcohol use, single  parenting, lack of occupation.   AXIS V:  Current is 40, this past year is 60.   INITIAL PLAN OF CARE:  Plan is a admission to [facility] for  alcohol abuse, depressive symptoms.  Contract for safety.  Will detoxify  with low dose Librium.  Will encourage fluids.  Will work on relapse  prevention.  The patient is to increase her coping skills.  Case manager is  to look at a rehabilitation program.  The patient is to remain alcohol free  and attend followup.   ESTIMATED LENGTH OF STAY:  Three to five days.     Landry Corporal, N.P.                       Jeanice Lim, M.D.    JO/MEDQ  D:  02/20/2004  T:  02/20/2004  Job:  [id number]

## 2011-01-21 NOTE — Discharge Summary (Signed)
NAME:  Sandra Miller, Sandra Miller                         ACCOUNT NO.:  [id number]   MEDICAL RECORD NO.:  [id number]                   PATIENT TYPE:  IPS   LOCATION:  0501                                 FACILITY:  [facility]   PHYSICIAN:  Jeanice Lim, M.D.              DATE OF BIRTH:  08-03-1976   DATE OF ADMISSION:  02/17/2004  DATE OF DISCHARGE:  02/20/2004                                 DISCHARGE SUMMARY   IDENTIFYING DATA:  This is a 35 year old single female voluntarily admitted.  She presented with a history of alcohol abuse, drinking daily for a year and  a half, escalating use to eight to ten 24 ounce beers, smoking marijuana.  Last drink was on June 13.  She was depressed, angry crying, episodes of  feeling angry at people.  She was a single mom.  Father had died in 10-06-2022  and she had not dealt with this.  This was the first hospitalization at  [facility].  She was seen at [facility].   MEDICATIONS:  Effexor and Zyprexa; had been off for eight months.   DRUG ALLERGIES:  No known drug allergies.   PHYSICAL EXAMINATION:  GENERAL:  Within normal limits.  NEUROLOGIC:  Nonfocal.   LABORATORY DATA:  Routine admission labs:  Within normal limits.   MENTAL STATUS EXAM:  Alert young female, cooperative, good eye contact.  Speech: Clear.  Mood: Angry.  Constricted affect.  Thought process: Goal  directed; no acute psychotic symptoms, passive suicidal ideation.  Cognitive: Intact.  Judgment and insight: Fair with history of poor impulse  control.   ADMISSION DIAGNOSES:   AXIS I:  1. Alcohol dependence.  2. Cannabis abuse.  3. Depression, not otherwise specified, versus substance-induced mood     disorder.   AXIS II:  Deferred.   AXIS III:  1. Kidney stones.  2. Cervical cancer.   AXIS IV:  Moderate problems with occupation and other psychosocial issues.   AXIS V:  40/60   HOSPITAL COURSE:  The patient was admitted, ordered routine p.r.n.  medications, underwent further monitoring, and was encouraged to participate  in individual, group, and milieu therapy including substance abuse  treatment.  She was placed on detoxification protocol for safe  detoxification and coping skills were worked on as well as a relapse  prevention plan.  The patient described being angry, irritable, struggling  with withdrawal symptoms, drinking up to 24 beers a day.  She had positive  history of mood swings.  She had a history of a good response to Effexor and  Zyprexa.  These medications were resumed and optimized and Librium p.r.n.  was ordered for a safe detoxification.  The patient gradually reported  resolution of withdrawal symptoms, stabilization of mood, was less angry and  irritable, mildly depressed but no suicidal or homicidal ideation, improved  coping skills as well as judgment and insight improved.   CONDITION  ON DISCHARGE:  She was discharged with no dangerous ideation or  psychotic, brighter affect, feeling more calm, no acute withdrawal symptoms,  motivated to be compliant with the aftercare plan.  She was given medication  education.  Risk/benefit ratio and alternative treatments regarding  medications were discussed and risk of rash with Lamictal and seriousness as  well as advised to stop medication immediately and call physician if this  occurs was reviewed on more than occasion with the patient.   DISCHARGE MEDICATIONS:  1. Protonix 40 mg q.a.m.  2. Zyprexa Zydis 10 mg at 9 p.m.  3. Effexor XR 75 mg q.a.m.  4. Lamictal 25 mg q.a.m. for a week and then two q.a.m.   FOLLOW UP:  The patient was to follow up at the [facility] to walk in between 9 a.m. and 5 p.m.  for an assessment and [facility] on Wednesday, June 22 at  11 a.m.   DISCHARGE DIAGNOSES:   AXIS I:  1. Alcohol dependence.  2. Cannabis abuse.  3. Depression, not otherwise  specified, versus substance-induced mood     disorder.   AXIS II:  Deferred.   AXIS III:  1. Kidney stones.  2. Cervical cancer.   AXIS IV:  Moderate problems with occupation and other psychosocial issues.   AXIS V:  Global assessment of functioning on discharge was 55.                                               Jeanice Lim, M.D.    JEM/MEDQ  D:  03/13/2004  T:  03/15/2004  Job:  [id number]

## 2011-01-21 NOTE — Discharge Summary (Signed)
NAME:  Sandra Miller, Sandra Miller               ACCOUNT NO.:  [id number]   MEDICAL RECORD NO.:  [id number]          PATIENT TYPE:  IPS   LOCATION:  0503                          FACILITY:  [facility]   PHYSICIAN:  Geoffery Lyons, M.D.      DATE OF BIRTH:  31-Oct-1975   DATE OF ADMISSION:  02/15/2005  DATE OF DISCHARGE:  02/19/2005                                 DISCHARGE SUMMARY   CHIEF COMPLAINT AND PRESENT ILLNESS:  This was the second admission to [facility] for this 35 year old single white female, voluntarily  admitted. History of depression and mood swings. Feeling angry. Not eating.  Several visits to the emergency room. Overwhelmed, stressed. Father died two  years prior to this admission. Drinking heavily after the father died. None  since last week.   PAST PSYCHIATRIC HISTORY:  Second time at [facility].  [facility]. Has been on Zyprexa, Zoloft, lithium,  Celexa, trazodone.   ALCOHOL AND DRUG HISTORY:  Endorsed increased use of alcohol after the  father died, up to a week ago. No other substances.   PAST MEDICAL HISTORY:  1.  Hypertension.  2.  Gastroesophageal reflux.   MEDICATIONS:  1.  Prilosec 20 mg per day.  2.  Trazodone 100 mg at night.  3.  Celexa 20 mg per day.  4.  Lorazepam as needed.   PHYSICAL EXAMINATION:  Physical exam performed but did not show any acute  findings.   LABORATORY DATA:  CBC within normal limits. Blood chemistries:  Glucose 118,  potassium 3.1. SGOT 28, SGPT 20. Lipid profile:  Cholesterol 155. TSH 1.367.  Urine pregnancy negative. Drug screen positive for marijuana,  benzodiazepines.   MENTAL STATUS EXAMINATION:  Revealed a fully alert, cooperative female. Fair  eye contact. Speech clear; normal rate, tempo and production. Mood  depressed. Affect labile, tearful. Thought processes logical, coherent, and  relevant. No delusions. No active suicidal or homicidal ideations. No  hallucinations.  Cognition well preserved.   ADMISSION DIAGNOSES:   AXIS I:  1.  Bipolar disorder, depressed.  2.  Polysubstance abuse.   AXIS II:  No diagnosis.   AXIS III:  1.  Gastroesophageal reflux.  2.  Hypertension.   AXIS IV:  Moderate.   AXIS V:  Global Assessment of Functioning upon admission 30, highest Global  Assessment of Functioning in the last year 60/65.   COURSE IN THE HOSPITAL:  She was admitted and started in individual and  group psychotherapy. She was given some Risperdal 0.5, kept on Librium every  4 hours as needed for anxiety, Protonix 40 mg per day. Risperdal was placed  at 0.5 at bedtime, and she was given some Ambien 10 at bedtime for sleep.  She was placed on Librium detox protocol. Eventually, Risperdal was  discontinued. She was placed on Neurontin 200 mg 3 times a day, Celexa 20 mg  per day, and Zyprexa Zydis 10 mg at night. She endorsed difficulty with mood  swings and depression, episodes of irritability, anger to the point she  could get violent, afraid to keep  going on like this. She _______________  endorsing previous use of alcohol. Increased use of alcohol in part to cope  in that she was dealing with panic attacks. Used Ativan but she admits it  quit working given her tolerance to it. Upon evaluation, she was very  anxious, agitated, with fear of loosing control. She did admit that she want  Zyprexa. It was discontinued to the fear of her developing side effects. She  endorsed that she was aware of the side effects, but she could not continue  to do as she was doing. She was wanting to go back on Zyprexa. On Zyprexa,  she did not experience any change in her blood sugar or her cholesterol. We  worked with the Celexa, Neurontin, and started on Zyprexa. She continued to  have a hard time with mood fluctuations with anxiety. Sleep was still an  issue. We went ahead and increased the Zyprexa to 50 mg at night. On June  16, she endorsed that she slept for the  first time through the night. Lipid  profile was within normal limits. Endorsed she was doing better just by  sleeping. Would like to pursue the Zyprexa. She developed some cramping in  her legs, possibility of side effects, and she was given some Cogentin on a  trial basis. So on June 17, she was in full contact with reality. There were  no suicidal ideas. No homicidal ideas. No hallucinations. No delusions. Her  mood was markedly euthymic. Her affect was bright. She felt well enough that  she could go home.   DISCHARGE DIAGNOSES:   AXIS I:  1.  Bipolar disorder, depressed.  2.  Polysubstance abuse.   AXIS II:  No diagnosis.   AXIS III:  1.  Gastroesophageal reflux.  2.  Hypertension.   AXIS IV:  Moderate.   AXIS V:  Global Assessment of Functioning upon discharge 55/60.   DISCHARGE MEDICATIONS:  1.  Protonix 40 mg per day.  2.  Neurontin 200 mg 3 times a day.  3.  Zyprexa Zydis 50 mg at night.  4.  Ambien 10 at night for sleep.  5.  Cogentin 1 mg twice a day.   FOLLOW UP:  [facility]. Also, Donnie Aho at [facility].       IL/MEDQ  D:  03/13/2005  T:  03/14/2005  Job:  [id number]

## 2011-01-21 NOTE — Discharge Summary (Signed)
NAME:  Sandra Miller, Sandra Miller               ACCOUNT NO.:  [id number]   MEDICAL RECORD NO.:  [id number]          PATIENT TYPE:  IPS   LOCATION:  0505                          FACILITY:  [facility]   PHYSICIAN:  Geoffery Lyons, M.D.      DATE OF BIRTH:  November 08, 1975   DATE OF ADMISSION:  07/04/2007  DATE OF DISCHARGE:  07/09/2007                               DISCHARGE SUMMARY   CHIEF COMPLAINT:  This is the third admission to [facility] for this 35 year old African-American female voluntarily  admitted.  She presented to the emergency room complaining of panic  attacks, anxiety, agitated with nausea and vomiting. She had drunk large  amounts of alcohol on the day prior to this admission. Had been drinking  heavily for 4 years since her father died. Endorsing mood problems with  a lot of irritability issues with anger management, feeling miserable.  Reports taking medications as prescribed.   PAST PSYCHIATRIC HISTORY:  Third time at [facility]. Had been  here in November 2006 and June 2005.  History of persistent use of  alcohol and marijuana as already stated.   MEDICAL HISTORY:  Gastroesophageal reflux, gastritis, hypertension.   MEDICATIONS:  Trazodone 300 mg at bedtime. Inderal 10 mg per day.  Protonix 40 mg per day, Ativan 1 mg that was given in the ED.   Physical examination performed did not show any acute findings.   LABORATORY WORKUP:  Urinary drug screen is positive for benzos and  marijuana. Sodium 136, potassium 3.6, glucose 105. CBC white cells 13.6,  hemoglobin 12.6.   PHYSICAL EXAMINATION:  GENERAL APPEARANCE:  Fully alert, cooperative  female, anxious, agitated.  PSYCHIATRIC:  Mood anxiety depression.  Affect tearful.  Somewhat  labile. Thought processes logical, coherent and relevant.  Endorsed  feeling very overwhelmed, wanting to get her life back together.  Denied  any active suicidal or homicidal ideas, no evidence of delusions.  No  hallucinations.   Cognition well preserved.   ASSESSMENT:  AXIS I: Mood disorder, not otherwise specified.  Alcohol  and marijuana abuse, rule out dependence.  AXIS II:  No diagnosis.  AXIS III:  Hypertension.  AXIS IV: Moderate.  AXIS v:  On admission 35. Highest GAF in the last year 70.   COURSE IN THE HOSPITAL:  She was admitted, started in individual and  group psychotherapy, and she was detoxified with Librium. She was given  some trazodone for sleep as well as Protonix.  She did endorse that in  October and November she usually loses her mind. In October father was  diagnosed with lung cancer, multiple losses around this time of the  year.  Endorsed drinking every day until she blacks out, 24 ounce can  beers six at a time drinking every day. Increased depression. Increased  use of alcohol over the last 5 days, six times in the ED over the last  few months. Went to court for hit and run and got a prior for judgment,  does endorse some explosive episodes.   PAST PSYCHIATRIC HISTORY:  [facility].  Endorsed she takes medication for a long period of time and then she  quits. Has two children, 15 and 13. On disability for bipolar disorder.  Had been on Seroquel, Abilify and Risperdal, Depakote, Neurontin,  Lexapro, Inderal, and then trazodone. On October 31 she was very having  a very hard time dealing with the anxiety, feeling very overwhelmed,  very tearful.  Endorsed that she understood she needed to do this for  herself and her children.  We continued through the talks and  reassessed. She endorsed severe incapacitating anxiety.  We considered  trying Neurontin.  She does endorse that the most effective medication  for her in the past has been Zyprexa, a concern about the weight gain,  but endorsed that at this point she would rather deal with the weight  gain than continued to feel the way she was feeling. So we did tried  Zyprexa 5 mg three times a day.  Continued to endorse having a rough time  panic attacks, crying, mood swings, irritable, angry.  Upon this  evaluation she was more anxious, hyperverbal.  We continued the  combination of Neurontin for anxiety. There was a remarkable changed. By  November 2 she endorsed she was doing better. Medications were helping.  She was prone with anger, racing thoughts, poor concentration but  endorsed she was committed to remain abstinent from alcohol, planning to  go to AA. Overall appeared much improved. She continued to get better.  On October 3 she was in full contact with reality. Her mood had  improved.  Her affect was brighter.  Endorsed she was better, felt  ready. She was committed to abstinence. She was going to go to 12-step.  Willing to commit to going to the [facility] for the medication.  She was a enrolling [employer] for wellness to monitor her possible  weight gain. So it was felt that she had obtained full benefit from the  hospitalization. Therefore, we went ahead and discharged to outpatient  follow-up.   DISCHARGE DIAGNOSES:  AXIS I: Alcohol and marijuana abuse. Bipolar  disorder.  AXIS II: No diagnosis.  AXIS II:No diagnosis.  AXIS IV:  Moderate.  AXIS V:  On discharge 55-60.   DISCHARGE PLAN:  Discharged home on Protonix 40 mg per day, trazodone  300 mg at bedtime, Neurontin 300 mg three times a day, Inderal 20 mg  twice a day and Zyprexa Zydis 5 mg three times a day.  She was also  given Antabuse 250 mg in the morning and, Tempra 333 mg 2 to 3 times a  day.  Follow up with Dr. Nathen May and [facility].      Geoffery Lyons, M.D.  Electronically Signed     IL/MEDQ  D:  08/03/2007  T:  08/03/2007  Job:  [id number]

## 2011-01-21 NOTE — Discharge Summary (Signed)
NAME:  Sandra Miller, Sandra Miller               ACCOUNT NO.:  [id number]   MEDICAL RECORD NO.:  [id number]          PATIENT TYPE:  IPS   LOCATION:  0504                          FACILITY:  [facility]   PHYSICIAN:  Geoffery Lyons, M.D.      DATE OF BIRTH:  03-16-1976   DATE OF ADMISSION:  07/06/2005  DATE OF DISCHARGE:  07/12/2005                                 DISCHARGE SUMMARY   CHIEF COMPLAINT AND PRESENT ILLNESS:  This was the third admission to [facility] for this 35 year old single African-American female  voluntarily admitted.  History of a nervous breakdown with increased panic  attacks, unable to care for children, racing thoughts, feeling that, if she  went to the hospital for the panic attacks, she would feel safe.  Wanted  help.  Worried that she would eventually hurt someone.  Sleeping poorly.  Increased appetite, 50-pound weight gain.  Reported no specific stressor.   PAST PSYCHIATRIC HISTORY:  Third time at [facility].  Seeing Donnie Aho.  Had been on Risperdal and Abilify.   ALCOHOL/DRUG HISTORY:  Smokes.  History of alcohol use, marijuana use.   MEDICAL HISTORY:  Gastroesophageal reflux.   MEDICATIONS:  Zyprexa 25 mg at night, Inderal LA, Ativan, Neurontin 400 mg  three times a day, Celexa 20 mg and Cogentin twice a day.   PHYSICAL EXAMINATION:  Performed and failed to show any acute findings.   LABORATORY DATA:  Drug screening positive for benzodiazepines.  Blood  chemistry with glucose 133.  Urine pregnancy test negative.   MENTAL STATUS EXAM:  Fully alert, cooperative female with fair eye contact.  Somewhat distant.  Speech clear, normal rate, tempo and production.  Mood  anxiety.  Affect constricted.  Thought processes logical, coherent and  relevant.  No evidence of psychosis.  Cognition was well-preserved.   ADMISSION DIAGNOSES:  AXIS I:  Anxiety disorder not otherwise specified.  Rule out mood disorder not otherwise specified.  AXIS II:  No  diagnosis.  AXIS III:  Gastroesophageal reflux.  AXIS IV:  Moderate.  AXIS V:  GAF upon admission 35; highest GAF in the last year 60.   HOSPITAL COURSE:  She was admitted.  She was started in individual and group  psychotherapy.  She endorsed worsening of her anxiety, increased agitation,  increased panic, a sense of feeling out of control.  Did not sleep well,  wake up in the middle of the night with anxiety attack.  Would not get out  of the house due to the same reason.  Felt incapacitated.  Felt that she  would rather be dead than to continue to feel this way.  She was initially  on Zyprexa 20 mg at night, Neurontin 400 mg three times a day, Inderal LA 80  mg at night, Celexa 20 mg per day, Ativan 0.5 mg three times a day as  needed, Promethazine 25 mg as needed, Cogentin 1 mg twice a day.  Zyprexa  was eventually discontinued and she was placed on Seroquel 25 mg twice a day  and 50 mg at night.  She  was given some Seroquel p.r.n.  She was also given  trazodone for sleep.  Eventually, Seroquel was discontinued and she was  placed on Neurontin, that was placed at 600 mg four times a day and Celexa  was increased to 30 mg.  Ativan was discontinued.  She was placed on  Klonopin.  Celexa was increased to 40 mg.  Klonopin was eventually  discontinued and she was placed on Tranxene.  Initially endorsed some  difficulty with anxiety, incapacitating, wanted to get it under control.  Under the acute anxiety, feeling overwhelmed, like hurting herself, wanting  to feel better.  Afraid she was going to relapse as a way to handle the  anxiety.  As she was already taking Celexa and tolerating it well, we went  ahead and increased it to 40 mg.  The Klonopin had made her more anxious, so  we switched to the Tranxene.  The panic was incapacitating.  She felt as if  she was going to have a heart attack.  Very concerned.  Very afraid of the  medication due to the side effects she had experienced so  far.  Initially  reluctant but she was willing to try the Tranxene.  That agreed with her.  She continued to experience the anxiety.  Upset with the medications not  working, understood that if she was expecting the medication to cause a side  effect, she was probably going to have one.  She continued to give it a try.  Endorsed persistent anxiety.  We increased Tranxene and worked with  cognitive behavioral ways of dealing with the anxiety.  On July 12, 2005,  she was much better.  She endorsed no suicidal ideation, no homicidal  ideation, no hallucinations, no delusions.  The anxiety was under better  control.  There was a marked decrease in panic.  Was wanting to get out as  she had to take care of the children.  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  It was  felt that she could take it from here.   DISCHARGE DIAGNOSES:  AXIS I:  Panic attacks.  Mood disorder not otherwise  specified.  AXIS II:  No diagnosis.  AXIS III:  Gastroesophageal reflux.  AXIS IV:  Moderate.  AXIS V:  GAF upon discharge 50-55.   DISCHARGE MEDICATIONS:  1.  Neurontin 600 mg four times a day.  2.  Celexa 40 mg per day.  3.  Tranxene 7.5 mg four times a day.  4.  Trazodone 50 mg, 1-2 at night for sleep.   FOLLOW UP:  [facility], Donnie Aho.      Geoffery Lyons, M.D.  Electronically Signed     IL/MEDQ  D:  08/01/2005  T:  08/01/2005  Job:  [id number]

## 2011-02-01 ENCOUNTER — Other Ambulatory Visit: Payer: Self-pay | Admitting: Gastroenterology

## 2011-02-07 ENCOUNTER — Encounter (HOSPITAL_COMMUNITY): Payer: Medicare Other | Admitting: Psychiatry

## 2011-02-07 DIAGNOSIS — F3189 Other bipolar disorder: Secondary | ICD-10-CM

## 2011-02-15 ENCOUNTER — Ambulatory Visit (HOSPITAL_BASED_OUTPATIENT_CLINIC_OR_DEPARTMENT_OTHER): Payer: Medicare Other | Admitting: Psychology

## 2011-02-15 DIAGNOSIS — F4322 Adjustment disorder with anxiety: Secondary | ICD-10-CM

## 2011-02-17 ENCOUNTER — Ambulatory Visit: Payer: Medicare Other | Attending: Family Medicine | Admitting: Physical Therapy

## 2011-02-17 DIAGNOSIS — M25519 Pain in unspecified shoulder: Secondary | ICD-10-CM | POA: Insufficient documentation

## 2011-02-17 DIAGNOSIS — IMO0001 Reserved for inherently not codable concepts without codable children: Secondary | ICD-10-CM | POA: Insufficient documentation

## 2011-02-17 DIAGNOSIS — R293 Abnormal posture: Secondary | ICD-10-CM | POA: Insufficient documentation

## 2011-02-25 ENCOUNTER — Encounter (HOSPITAL_COMMUNITY): Payer: Medicare Other | Admitting: Psychiatry

## 2011-03-04 ENCOUNTER — Encounter (INDEPENDENT_AMBULATORY_CARE_PROVIDER_SITE_OTHER): Payer: Medicare Other | Admitting: Psychology

## 2011-03-04 DIAGNOSIS — F429 Obsessive-compulsive disorder, unspecified: Secondary | ICD-10-CM

## 2011-03-07 ENCOUNTER — Ambulatory Visit: Payer: Medicare Other | Attending: Family Medicine | Admitting: Physical Therapy

## 2011-03-07 DIAGNOSIS — M25519 Pain in unspecified shoulder: Secondary | ICD-10-CM | POA: Insufficient documentation

## 2011-03-07 DIAGNOSIS — R293 Abnormal posture: Secondary | ICD-10-CM | POA: Insufficient documentation

## 2011-03-07 DIAGNOSIS — IMO0001 Reserved for inherently not codable concepts without codable children: Secondary | ICD-10-CM | POA: Insufficient documentation

## 2011-03-09 ENCOUNTER — Emergency Department (HOSPITAL_COMMUNITY)
Admission: EM | Admit: 2011-03-09 | Discharge: 2011-03-09 | Disposition: A | Discharge status: Home / Self Care | Payer: Medicare Other | Attending: Emergency Medicine | Admitting: Emergency Medicine

## 2011-03-09 DIAGNOSIS — I10 Essential (primary) hypertension: Secondary | ICD-10-CM | POA: Insufficient documentation

## 2011-03-09 DIAGNOSIS — R51 Headache: Secondary | ICD-10-CM | POA: Insufficient documentation

## 2011-03-09 DIAGNOSIS — F319 Bipolar disorder, unspecified: Secondary | ICD-10-CM | POA: Insufficient documentation

## 2011-03-09 DIAGNOSIS — H539 Unspecified visual disturbance: Secondary | ICD-10-CM | POA: Insufficient documentation

## 2011-03-09 DIAGNOSIS — Z79899 Other long term (current) drug therapy: Secondary | ICD-10-CM | POA: Insufficient documentation

## 2011-03-09 DIAGNOSIS — H5789 Other specified disorders of eye and adnexa: Secondary | ICD-10-CM | POA: Insufficient documentation

## 2011-03-09 DIAGNOSIS — H53149 Visual discomfort, unspecified: Secondary | ICD-10-CM | POA: Insufficient documentation

## 2011-03-09 DIAGNOSIS — H538 Other visual disturbances: Secondary | ICD-10-CM | POA: Insufficient documentation

## 2011-03-09 LAB — GLUCOSE, CAPILLARY: Glucose-Capillary: 106 mg/dL — ABNORMAL HIGH (ref 70–99)

## 2011-03-10 ENCOUNTER — Encounter: Payer: Medicare Other | Admitting: Physical Therapy

## 2011-03-11 ENCOUNTER — Encounter (INDEPENDENT_AMBULATORY_CARE_PROVIDER_SITE_OTHER): Payer: Medicare Other | Admitting: Psychology

## 2011-03-11 DIAGNOSIS — F319 Bipolar disorder, unspecified: Secondary | ICD-10-CM

## 2011-03-16 ENCOUNTER — Encounter (HOSPITAL_COMMUNITY): Payer: Medicare Other | Admitting: Psychiatry

## 2011-03-18 ENCOUNTER — Encounter (HOSPITAL_COMMUNITY): Payer: Medicare Other | Admitting: Psychology

## 2011-03-25 ENCOUNTER — Encounter (HOSPITAL_COMMUNITY): Payer: Medicare Other | Admitting: Psychology

## 2011-04-04 ENCOUNTER — Encounter (INDEPENDENT_AMBULATORY_CARE_PROVIDER_SITE_OTHER): Payer: Medicare Other | Admitting: Psychiatry

## 2011-04-04 DIAGNOSIS — F3189 Other bipolar disorder: Secondary | ICD-10-CM

## 2011-04-07 ENCOUNTER — Encounter (INDEPENDENT_AMBULATORY_CARE_PROVIDER_SITE_OTHER): Payer: Medicare Other | Admitting: Psychology

## 2011-04-07 DIAGNOSIS — F319 Bipolar disorder, unspecified: Secondary | ICD-10-CM

## 2011-04-07 DIAGNOSIS — F429 Obsessive-compulsive disorder, unspecified: Secondary | ICD-10-CM

## 2011-05-30 LAB — URINE DRUGS OF ABUSE SCREEN W ALC, ROUTINE (REF LAB)
Barbiturate Quant, Ur: NEGATIVE
Benzodiazepines.: NEGATIVE
Ethyl Alcohol: 9
Marijuana Metabolite: NEGATIVE

## 2011-05-31 LAB — URINE DRUGS OF ABUSE SCREEN W ALC, ROUTINE (REF LAB)
Amphetamine Screen, Ur: NEGATIVE
Amphetamine Screen, Ur: NEGATIVE
Amphetamine Screen, Ur: NEGATIVE
Barbiturate Quant, Ur: NEGATIVE
Barbiturate Quant, Ur: NEGATIVE
Barbiturate Quant, Ur: NEGATIVE
Benzodiazepines.: NEGATIVE
Creatinine,U: 163.7
Creatinine,U: 182.9
Creatinine,U: 51.7
Ethyl Alcohol: <5
Ethyl Alcohol: <5
Ethyl Alcohol: <5
Marijuana Metabolite: NEGATIVE
Marijuana Metabolite: NEGATIVE
Marijuana Metabolite: NEGATIVE
Opiate Screen, Urine: NEGATIVE

## 2011-06-03 LAB — POCT PREGNANCY, URINE: Preg Test, Ur: NEGATIVE

## 2011-06-03 LAB — COMPREHENSIVE METABOLIC PANEL
ALT: 15
AST: 20
Albumin: 3.9
CO2: 26
Calcium: 9.1
Chloride: 107
GFR calc Af Amer: >60
GFR calc non Af Amer: >60
Sodium: 140
Total Bilirubin: 0.6

## 2011-06-03 LAB — DIFFERENTIAL
Basophils Relative: 0
Eosinophils Absolute: 0.1
Lymphs Abs: 2
Neutro Abs: 8.9 — ABNORMAL HIGH
Neutrophils Relative %: 78 — ABNORMAL HIGH

## 2011-06-03 LAB — URINALYSIS, ROUTINE W REFLEX MICROSCOPIC
Ketones, ur: NEGATIVE
Nitrite: NEGATIVE
Protein, ur: NEGATIVE

## 2011-06-03 LAB — LIPASE, BLOOD: Lipase: 26

## 2011-06-03 LAB — CBC
Platelets: 282
RBC: 4.19
WBC: 11.4 — ABNORMAL HIGH

## 2011-06-06 ENCOUNTER — Encounter (HOSPITAL_COMMUNITY): Payer: Medicare Other | Admitting: Psychiatry

## 2011-06-06 LAB — POCT I-STAT, CHEM 8
BUN: 5 — ABNORMAL LOW
Calcium, Ion: 1.04 — ABNORMAL LOW
TCO2: 23

## 2011-06-06 LAB — URINE MICROSCOPIC-ADD ON

## 2011-06-06 LAB — URINALYSIS, ROUTINE W REFLEX MICROSCOPIC
Glucose, UA: NEGATIVE
Specific Gravity, Urine: 1.019
Urobilinogen, UA: 0.2
pH: 6.5

## 2011-06-06 LAB — POCT PREGNANCY, URINE: Preg Test, Ur: NEGATIVE

## 2011-06-07 LAB — DIFFERENTIAL
Lymphocytes Relative: 25
Lymphs Abs: 3.6
Monocytes Absolute: 0.7
Monocytes Relative: 5
Neutro Abs: 9.8 — ABNORMAL HIGH

## 2011-06-07 LAB — CBC
HCT: 41.3
MCV: 90.5
Platelets: 386
RDW: 13.8

## 2011-06-07 LAB — POCT URINALYSIS DIP (DEVICE)
Ketones, ur: NEGATIVE mg/dL
Protein, ur: NEGATIVE mg/dL
pH: 6 (ref 5.0–8.0)

## 2011-06-07 LAB — URINALYSIS, ROUTINE W REFLEX MICROSCOPIC
Ketones, ur: NEGATIVE
Nitrite: NEGATIVE
Protein, ur: NEGATIVE

## 2011-06-07 LAB — WET PREP, GENITAL
Trich, Wet Prep: NONE SEEN
Trich, Wet Prep: NONE SEEN
Yeast Wet Prep HPF POC: NONE SEEN
Yeast Wet Prep HPF POC: NONE SEEN

## 2011-06-07 LAB — POCT PREGNANCY, URINE: Preg Test, Ur: NEGATIVE

## 2011-06-07 LAB — COMPREHENSIVE METABOLIC PANEL
Albumin: 4.3
BUN: 4 — ABNORMAL LOW
Creatinine, Ser: 0.74
Potassium: 3.3 — ABNORMAL LOW
Total Protein: 7.8

## 2011-06-07 LAB — GC/CHLAMYDIA PROBE AMP, GENITAL: Chlamydia, DNA Probe: NEGATIVE

## 2011-06-07 LAB — URINE MICROSCOPIC-ADD ON

## 2011-06-08 ENCOUNTER — Encounter (HOSPITAL_COMMUNITY): Payer: Medicare Other | Admitting: Psychiatry

## 2011-06-08 LAB — POCT PREGNANCY, URINE: Preg Test, Ur: NEGATIVE

## 2011-06-08 LAB — URINALYSIS, ROUTINE W REFLEX MICROSCOPIC
Bilirubin Urine: NEGATIVE
Hgb urine dipstick: NEGATIVE
Ketones, ur: NEGATIVE
Nitrite: NEGATIVE
pH: 6

## 2011-06-08 LAB — URINE MICROSCOPIC-ADD ON

## 2011-06-08 LAB — POCT I-STAT, CHEM 8
Chloride: 106
Glucose, Bld: 103 — ABNORMAL HIGH
HCT: 41
Hemoglobin: 13.9
Potassium: 3.6
Sodium: 143

## 2011-06-15 LAB — COMPREHENSIVE METABOLIC PANEL
ALT: 12
BUN: 7
CO2: 18 — ABNORMAL LOW
Calcium: 8 — ABNORMAL LOW
GFR calc non Af Amer: >60
Glucose, Bld: 81
Total Protein: 6.8

## 2011-06-15 LAB — DIFFERENTIAL
Basophils Relative: 1
Eosinophils Absolute: 0
Lymphs Abs: 3.3
Monocytes Relative: 5
Neutro Abs: 9.6 — ABNORMAL HIGH
Neutrophils Relative %: 70

## 2011-06-15 LAB — RAPID URINE DRUG SCREEN, HOSP PERFORMED
Barbiturates: NOT DETECTED
Cocaine: NOT DETECTED
Opiates: NOT DETECTED

## 2011-06-15 LAB — TSH: TSH: 1.362

## 2011-06-15 LAB — CBC
HCT: 37.2
Hemoglobin: 12.6
MCHC: 33.9
MCV: 86.3
RBC: 4.31
RDW: 15.4 — ABNORMAL HIGH

## 2011-06-15 LAB — PREGNANCY, URINE: Preg Test, Ur: NEGATIVE

## 2011-06-16 LAB — BASIC METABOLIC PANEL
BUN: 7
Creatinine, Ser: 0.79
GFR calc non Af Amer: >60
Potassium: 3.2 — ABNORMAL LOW

## 2011-06-16 LAB — URINALYSIS, ROUTINE W REFLEX MICROSCOPIC
Bilirubin Urine: NEGATIVE
Bilirubin Urine: NEGATIVE
Nitrite: NEGATIVE
Protein, ur: >300 — AB
Urobilinogen, UA: 0.2
Urobilinogen, UA: 1
pH: 6

## 2011-06-16 LAB — COMPREHENSIVE METABOLIC PANEL
ALT: 17
Albumin: 4.6
Alkaline Phosphatase: 64
BUN: 9
Chloride: 105
Potassium: 3.7
Total Bilirubin: 0.6

## 2011-06-16 LAB — CBC
HCT: 44
Hemoglobin: 15.3 — ABNORMAL HIGH
Platelets: 337
Platelets: 451 — ABNORMAL HIGH
WBC: 15.1 — ABNORMAL HIGH
WBC: 18.4 — ABNORMAL HIGH

## 2011-06-16 LAB — DIFFERENTIAL
Basophils Absolute: 0
Basophils Relative: 0
Eosinophils Absolute: 0.1
Eosinophils Relative: 1
Monocytes Absolute: 1.2 — ABNORMAL HIGH
Neutro Abs: 12.6 — ABNORMAL HIGH

## 2011-06-16 LAB — CULTURE, ROUTINE-ABSCESS

## 2011-06-16 LAB — URINE MICROSCOPIC-ADD ON

## 2011-06-16 LAB — URINE CULTURE

## 2011-06-16 LAB — PREGNANCY, URINE: Preg Test, Ur: NEGATIVE

## 2011-06-20 ENCOUNTER — Encounter (INDEPENDENT_AMBULATORY_CARE_PROVIDER_SITE_OTHER): Payer: Medicare Other | Admitting: Psychiatry

## 2011-06-20 DIAGNOSIS — F3189 Other bipolar disorder: Secondary | ICD-10-CM

## 2011-08-15 ENCOUNTER — Encounter (HOSPITAL_COMMUNITY): Payer: Medicare Other | Admitting: Psychiatry

## 2011-09-14 ENCOUNTER — Ambulatory Visit (INDEPENDENT_AMBULATORY_CARE_PROVIDER_SITE_OTHER): Payer: Medicare Other | Admitting: Psychiatry

## 2011-09-14 DIAGNOSIS — F319 Bipolar disorder, unspecified: Secondary | ICD-10-CM

## 2011-09-14 MED ORDER — OLANZAPINE 15 MG PO TABS: 15.0000 mg | ORAL_TABLET | Freq: Every day | ORAL | Status: DC

## 2011-09-14 MED ORDER — LORAZEPAM 1 MG PO TABS: 1.0000 mg | ORAL_TABLET | Freq: Every evening | ORAL | Status: DC | PRN

## 2011-09-14 MED ORDER — TRAZODONE HCL 150 MG PO TABS: ORAL_TABLET | ORAL | Status: DC

## 2011-09-14 NOTE — Progress Notes (Signed)
Patient came for her followup appointment. She is doing better on her current medication. She is not taking 3 medication for her hypertension. She continued to have moments of agitation anger and mood swings but overall she feels her current medicine is working well. 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 One of her client moved to [place] and that made her very depressed and sad however she has been in touch with the client and that makes her happy. She denies any recent agitation anger or mood swings. She had a good and quite Christmas. She was to continue her current medication. She still takes Ativan 1 mg when necessary for severe anxiety.  Mental status examination Patient is casually dressed fairly groomed. Her speech is soft clear and coherent. Her thought process logical linear and goal-directed. She has any active or passive suicidal thoughts or homicidal thoughts. There no psychotic symptoms present. She's alert and oriented x3. Her insight judgment and impulse control is okay.  Assessment Bipolar disorder  Plan I will continue her current medication including Zyprexa 50 mg at bedtime trazodone 150 mg 1-2 tablet at bedtime and Ativan 1 mg as needed for severe anxiety. I have explained risks and benefits of medication in detail. I recommended to call us if she is a question concerns about the medication or if she feels worsening of her symptoms. I will see her again in 2 months

## 2011-09-23 ENCOUNTER — Emergency Department (HOSPITAL_COMMUNITY)
Admission: EM | Admit: 2011-09-23 | Discharge: 2011-09-23 | Disposition: A | Discharge status: Home / Self Care | Payer: Medicare Other | Attending: Emergency Medicine | Admitting: Emergency Medicine

## 2011-09-23 ENCOUNTER — Other Ambulatory Visit: Payer: Self-pay

## 2011-09-23 ENCOUNTER — Encounter (HOSPITAL_COMMUNITY): Payer: Self-pay | Admitting: *Deleted

## 2011-09-23 DIAGNOSIS — F419 Anxiety disorder, unspecified: Secondary | ICD-10-CM

## 2011-09-23 DIAGNOSIS — R51 Headache: Secondary | ICD-10-CM

## 2011-09-23 DIAGNOSIS — R079 Chest pain, unspecified: Secondary | ICD-10-CM | POA: Diagnosis not present

## 2011-09-23 DIAGNOSIS — F411 Generalized anxiety disorder: Secondary | ICD-10-CM | POA: Insufficient documentation

## 2011-09-23 DIAGNOSIS — I10 Essential (primary) hypertension: Secondary | ICD-10-CM | POA: Diagnosis not present

## 2011-09-23 DIAGNOSIS — Z79899 Other long term (current) drug therapy: Secondary | ICD-10-CM | POA: Diagnosis not present

## 2011-09-23 DIAGNOSIS — T50905A Adverse effect of unspecified drugs, medicaments and biological substances, initial encounter: Secondary | ICD-10-CM

## 2011-09-23 DIAGNOSIS — T887XXA Unspecified adverse effect of drug or medicament, initial encounter: Secondary | ICD-10-CM | POA: Insufficient documentation

## 2011-09-23 DIAGNOSIS — R110 Nausea: Secondary | ICD-10-CM | POA: Diagnosis not present

## 2011-09-23 DIAGNOSIS — R0789 Other chest pain: Secondary | ICD-10-CM | POA: Diagnosis not present

## 2011-09-23 DIAGNOSIS — R6889 Other general symptoms and signs: Secondary | ICD-10-CM | POA: Diagnosis not present

## 2011-09-23 DIAGNOSIS — F17200 Nicotine dependence, unspecified, uncomplicated: Secondary | ICD-10-CM | POA: Insufficient documentation

## 2011-09-23 HISTORY — DX: Essential (primary) hypertension: I10

## 2011-09-23 HISTORY — DX: Gastro-esophageal reflux disease without esophagitis: K21.9

## 2011-09-23 LAB — CBC
HCT: 38.9 % (ref 36.0–46.0)
Hemoglobin: 13.6 g/dL (ref 12.0–15.0)
MCV: 88.2 fL (ref 78.0–100.0)
RBC: 4.41 MIL/uL (ref 3.87–5.11)
WBC: 11.3 10*3/uL — ABNORMAL HIGH (ref 4.0–10.5)

## 2011-09-23 LAB — DIFFERENTIAL
Eosinophils Relative: 1 % (ref 0–5)
Lymphocytes Relative: 35 % (ref 12–46)
Lymphs Abs: 3.9 10*3/uL (ref 0.7–4.0)
Monocytes Absolute: 0.7 10*3/uL (ref 0.1–1.0)
Monocytes Relative: 6 % (ref 3–12)
Neutro Abs: 6.5 10*3/uL (ref 1.7–7.7)

## 2011-09-23 LAB — BASIC METABOLIC PANEL
CO2: 24 mEq/L (ref 19–32)
Calcium: 9.5 mg/dL (ref 8.4–10.5)
Chloride: 100 mEq/L (ref 96–112)
Creatinine, Ser: 0.76 mg/dL (ref 0.50–1.10)
Glucose, Bld: 102 mg/dL — ABNORMAL HIGH (ref 70–99)

## 2011-09-23 LAB — CARDIAC PANEL(CRET KIN+CKTOT+MB+TROPI)
CK, MB: 1.6 ng/mL (ref 0.3–4.0)
Relative Index: INVALID (ref 0.0–2.5)

## 2011-09-23 MED ORDER — SODIUM CHLORIDE 0.9 % IV BOLUS (SEPSIS)
1000.0000 mL | Freq: Once | INTRAVENOUS | Status: AC
  Administered 2011-09-23: 1000 mL via INTRAVENOUS

## 2011-09-23 MED ORDER — PROMETHAZINE HCL 25 MG/ML IJ SOLN
25.0000 mg | Freq: Once | INTRAMUSCULAR | Status: AC
  Administered 2011-09-23: 25 mg via INTRAVENOUS
  Filled 2011-09-23: qty 1

## 2011-09-23 NOTE — ED Provider Notes (Signed)
History     CSN: [id number]  Arrival date & time 09/23/11  1953   First MD Initiated Contact with Patient 09/23/11 1958      Chief Complaint  Patient presents with  . Anxiety    (Consider location/radiation/quality/duration/timing/severity/associated sxs/prior treatment) HPI Comments: Patient here after taking her lisinopril - she states that she has only taken this medication once before and that it gave her the same response - she reports that today she has had a headache all day - has taken multiple doses of tylenol without relief - states that she figured her blood pressure was up so she took the lisinopril - she states that she immediately began to feel anxious, chest tightness, nausea - she states this is the same thing that happened when she initially took it - states that before she called 911 she did take her ativan.  Reports mild improvement in symptoms since arrived.  Currently denies chest pain.  Patient is a 36 y.o. female presenting with anxiety. The history is provided by the patient. No language interpreter was used.  Anxiety This is a new problem. The current episode started today. The problem occurs constantly. The problem has been unchanged. Associated symptoms include chest pain, fatigue, headaches and nausea. Pertinent negatives include no abdominal pain, anorexia, chills, congestion, coughing, diaphoresis, fever, myalgias, neck pain, numbness, rash, sore throat, visual change, vomiting or weakness. The symptoms are aggravated by nothing. She has tried nothing for the symptoms. The treatment provided no relief.    Past Medical History  Diagnosis Date  . Anxiety   . Hypertension   . Bipolar disorder   . GERD (gastroesophageal reflux disease)     Past Surgical History  Procedure Date  . Cesarean section   . Knee arthroscopy     left    History reviewed. No pertinent family history.  History  Substance Use Topics  . Smoking status: Current Everyday Smoker --  1.0 packs/day  . Smokeless tobacco: Never Used  . Alcohol Use: 4.2 oz/week    7 Cans of beer per week    OB History    Grav Para Term Preterm Abortions TAB SAB Ect Mult Living                  Review of Systems  Constitutional: Positive for fatigue. Negative for fever, chills and diaphoresis.  HENT: Negative for congestion, sore throat and neck pain.   Respiratory: Negative for cough.   Cardiovascular: Positive for chest pain.  Gastrointestinal: Positive for nausea. Negative for vomiting, abdominal pain and anorexia.  Musculoskeletal: Negative for myalgias.  Skin: Negative for rash.  Neurological: Positive for headaches. Negative for weakness and numbness.  All other systems reviewed and are negative.    Allergies  Aspirin and Minocycline  Home Medications   Current Outpatient Rx  Name Route Sig Dispense Refill  . DEXILANT 60 MG PO CPDR  TAKE 1 CAPSULE BY MOUTH ONCE DAILY 30 capsule 11  . LISINOPRIL 20 MG PO TABS Oral Take 10 mg by mouth daily.     Marland Kitchen LISINOPRIL-HYDROCHLOROTHIAZIDE 20-25 MG PO TABS Oral Take 1 tablet by mouth daily.    Marland Kitchen LORAZEPAM 1 MG PO TABS Oral Take 1 mg by mouth daily as needed. For anxiety    . OLANZAPINE 15 MG PO TABS Oral Take 15 mg by mouth daily as needed. For anxiousness    . PROPRANOLOL HCL 20 MG PO TABS Oral Take 20 mg by mouth 6 (six) times daily.    Marland Kitchen  TRAZODONE HCL 150 MG PO TABS  300 mg. Take 2 tab at bed time      BP 127/80  Pulse 91  Temp(Src) 99.1 F (37.3 C) (Oral)  Resp 19  SpO2 100%  Physical Exam  Nursing note and vitals reviewed. Constitutional: She is oriented to person, place, and time. She appears well-developed and well-nourished. No distress.       Initially hypertensive - bp down to 134/78 while I was in room  HENT:  Head: Normocephalic and atraumatic.  Right Ear: External ear normal.  Left Ear: External ear normal.  Nose: Nose normal.  Mouth/Throat: Oropharynx is clear and moist. No oropharyngeal exudate.  Eyes:  Conjunctivae are normal. Pupils are equal, round, and reactive to light. No scleral icterus.  Neck: Normal range of motion. Neck supple. No thyromegaly present.  Cardiovascular: Normal rate, regular rhythm and normal heart sounds.  Exam reveals no gallop and no friction rub.   No murmur heard. Pulmonary/Chest: Effort normal and breath sounds normal. No respiratory distress.  Abdominal: Soft. Bowel sounds are normal. There is no tenderness.  Musculoskeletal: Normal range of motion.  Lymphadenopathy:    She has no cervical adenopathy.  Neurological: She is alert and oriented to person, place, and time. No cranial nerve deficit.  Skin: Skin is warm and dry.  Psychiatric: She has a normal mood and affect. Her behavior is normal. Judgment and thought content normal.    ED Course  Procedures (including critical care time)  Labs Reviewed  CBC - Abnormal; Notable for the following:    WBC 11.3 (*)    All other components within normal limits  BASIC METABOLIC PANEL - Abnormal; Notable for the following:    Potassium 3.1 (*)    Glucose, Bld 102 (*)    All other components within normal limits  DIFFERENTIAL  CARDIAC PANEL(CRET KIN+CKTOT+MB+TROPI)   No results found. Results for orders placed during the hospital encounter of 09/23/11  CBC      Component Value Range   WBC 11.3 (*) 4.0 - 10.5 (K/uL)   RBC 4.41  3.87 - 5.11 (MIL/uL)   Hemoglobin 13.6  12.0 - 15.0 (g/dL)   HCT 96.0  45.4 - 09.8 (%)   MCV 88.2  78.0 - 100.0 (fL)   MCH 30.8  26.0 - 34.0 (pg)   MCHC 35.0  30.0 - 36.0 (g/dL)   RDW 11.9  14.7 - 82.9 (%)   Platelets 356  150 - 400 (K/uL)  DIFFERENTIAL      Component Value Range   Neutrophils Relative 57  43 - 77 (%)   Neutro Abs 6.5  1.7 - 7.7 (K/uL)   Lymphocytes Relative 35  12 - 46 (%)   Lymphs Abs 3.9  0.7 - 4.0 (K/uL)   Monocytes Relative 6  3 - 12 (%)   Monocytes Absolute 0.7  0.1 - 1.0 (K/uL)   Eosinophils Relative 1  0 - 5 (%)   Eosinophils Absolute 0.2  0.0 - 0.7  (K/uL)   Basophils Relative 0  0 - 1 (%)   Basophils Absolute 0.0  0.0 - 0.1 (K/uL)  BASIC METABOLIC PANEL      Component Value Range   Sodium 137  135 - 145 (mEq/L)   Potassium 3.1 (*) 3.5 - 5.1 (mEq/L)   Chloride 100  96 - 112 (mEq/L)   CO2 24  19 - 32 (mEq/L)   Glucose, Bld 102 (*) 70 - 99 (mg/dL)   BUN 10  6 -  23 (mg/dL)   Creatinine, Ser 4.09  0.50 - 1.10 (mg/dL)   Calcium 9.5  8.4 - 81.1 (mg/dL)   GFR calc non Af Amer >90  >90 (mL/min)   GFR calc Af Amer >90  >90 (mL/min)  CARDIAC PANEL(CRET KIN+CKTOT+MB+TROPI)      Component Value Range   Total CK 51  7 - 177 (U/L)   CK, MB 1.6  0.3 - 4.0 (ng/mL)   Troponin I <0.30  <0.30 (ng/mL)   Relative Index RELATIVE INDEX IS INVALID  0.0 - 2.5    No results found.   Date: 09/23/2011  Rate: 83  Rhythm: normal sinus rhythm  QRS Axis: normal  Intervals: normal  ST/T Wave abnormalities: normal  Conduction Disutrbances:none  Narrative Interpretation: Reviewed by Dr. Karma Ganja  Old EKG Reviewed: unchanged   Medication reaction   MDM  Patient here with headache, and anxiety after taking lisinopril.  I do not suspect cardiac cause to chest tightness, I am also doubtful of intercranial abnormalities in relation to the headache.  Her symptoms have completely resolved after fluids, phenergan IV for the nausea, states no headache at this time and blood pressure down to 124/78.  Feels ready for discharge, she has been advised not to take the lisinopril until she can see her PCP at [facility]        Izola Price Murphy, [place] 09/23/11 2209

## 2011-09-23 NOTE — ED Notes (Signed)
Per EMS: EMS called to home because pt took lisinopril today and has "not been feeling well" since taking the medication. EMS reports pt is anxious with mild N/V. Pt took a 1mg  of ativan 10 mins prior to calling EMS. Pt was ambulatory to her room with a steady gait. Family/friend at bedside.

## 2011-09-23 NOTE — ED Provider Notes (Signed)
Medical screening examination/treatment/procedure(s) were performed by non-physician practitioner and as supervising physician I was immediately available for consultation/collaboration.  Ethelda Chick, MD 09/23/11 2215

## 2011-09-23 NOTE — ED Notes (Signed)
ZOX:[id number]<AV> Expected date:09/23/11<BR> Expected time: 7:52 PM<BR> Means of arrival:Ambulance<BR> Comments:<BR> M80 - 35yoF hasnt felt well after taking BP meds tonight.  Feels anxious.

## 2011-09-23 NOTE — ED Notes (Signed)
Pt on continuous cardiac/pulse ox monitoring. Will continue to monitor

## 2011-09-23 NOTE — ED Notes (Signed)
Vital signs stable. 

## 2011-09-23 NOTE — ED Notes (Signed)
Pt ambulated to the bathroom with a steady gait. Pt states she felt a little dizzy while walking. Pt instructed to provide a urine sample

## 2011-09-29 ENCOUNTER — Encounter (HOSPITAL_COMMUNITY): Payer: Self-pay | Admitting: *Deleted

## 2011-10-10 DIAGNOSIS — I10 Essential (primary) hypertension: Secondary | ICD-10-CM | POA: Diagnosis not present

## 2011-10-17 ENCOUNTER — Telehealth: Payer: Self-pay | Admitting: Gastroenterology

## 2011-10-17 MED ORDER — ESOMEPRAZOLE MAGNESIUM 40 MG PO CPDR: 40.0000 mg | DELAYED_RELEASE_CAPSULE | Freq: Every day | ORAL | Status: DC

## 2011-10-17 NOTE — Telephone Encounter (Signed)
Pt states that her dexilant needs prior auth but she doesn't feel like it works well for her anymore she has already has tried omeprazole and protonix and they do not work either. I will leave samples of Nexium up front for her to pick up and Made an office visit for her for 10/28/2011 at 10am.

## 2011-10-24 ENCOUNTER — Other Ambulatory Visit (HOSPITAL_COMMUNITY): Payer: Self-pay | Admitting: Psychiatry

## 2011-10-26 ENCOUNTER — Encounter: Payer: Self-pay | Admitting: *Deleted

## 2011-10-28 ENCOUNTER — Encounter: Payer: Self-pay | Admitting: Gastroenterology

## 2011-10-28 ENCOUNTER — Ambulatory Visit (INDEPENDENT_AMBULATORY_CARE_PROVIDER_SITE_OTHER): Payer: Medicare Other | Admitting: Gastroenterology

## 2011-10-28 VITALS — BP 106/66 | HR 84 | Ht 65.5 in | Wt 180.4 lb

## 2011-10-28 DIAGNOSIS — R1319 Other dysphagia: Secondary | ICD-10-CM | POA: Insufficient documentation

## 2011-10-28 DIAGNOSIS — R1314 Dysphagia, pharyngoesophageal phase: Secondary | ICD-10-CM | POA: Diagnosis not present

## 2011-10-28 DIAGNOSIS — K219 Gastro-esophageal reflux disease without esophagitis: Secondary | ICD-10-CM

## 2011-10-28 DIAGNOSIS — R1310 Dysphagia, unspecified: Secondary | ICD-10-CM

## 2011-10-28 MED ORDER — ESOMEPRAZOLE MAGNESIUM 40 MG PO CPDR: 40.0000 mg | DELAYED_RELEASE_CAPSULE | Freq: Two times a day (BID) | ORAL | Status: DC

## 2011-10-28 MED ORDER — HYOSCYAMINE SULFATE 0.125 MG SL SUBL: 0.1250 mg | SUBLINGUAL_TABLET | SUBLINGUAL | Status: DC | PRN

## 2011-10-28 NOTE — Patient Instructions (Addendum)
You have been given a separate informational sheet regarding your tobacco use, the importance of quitting and local resources to help you quit. Your procedure has been scheduled for 11/11/2011, please follow the seperate instructions.  Take Nexium twice a day, samples given and a new rx has been sent to your pharmacy. Take Levsin as needed.

## 2011-10-28 NOTE — Progress Notes (Signed)
This is a 36 year old African American female with chronic acid reflux. Previous attempts at endoscopy and dilation were unsuccessful because of uncooperativeness and severe retching and gagging. She takes Nexium 40 mg a day, and only has mild improvement in her reflux symptoms, and continues with intermittent solid food dysphagia. She has recently quit smoking, quit using marijuana, denies alcohol abuse at this time. She has had adjustments in her antihypertensive medications, and describes orthostatic dizziness. She has no history of angina or other cardiovascular or neurological problems. She does have chronic anxiety syndrome and uses when necessary Ativan. Her family physician is Dr. Georganna Skeans.  Current Medications, Allergies, Past Medical History, Past Surgical History, Family History and Social History were reviewed in [employer] record.  Pertinent Review of Systems Negative... no history of Raynaud's phenomenon or other symptoms of collagen vascular disease. Her appetite is good her weight is stable. She denies bowel or regularity, melena or hematochezia. Previous evaluation for celiac disease was negative including small bowel biopsy.   Physical Exam: Healthy appearing patient in no acute distress. Her blood pressure today is 106/66, and pulse is 84 and regular. BMI is 29.56. I cannot appreciate stigmata of chronic liver disease, thyromegaly or lymphadenopathy. Chest is clear and she is in a regular rhythm without murmurs gallops or rubs. There is no organomegaly, abdominal masses, tenderness, or abnormal bowel sounds. Mental status is norma,l and peripheral extremities are unremarkable.   Assessment and Plan: Chronic acid reflux and probable peptic stricture of esophagus. We will reschedule endoscopy with propofol sedation. I have increased her Nexium to 40 mg twice a day before meals, and never reviewed a reflux regime with her. I've encouraged her to continue her no  smoking campaign, alcohol and marijuana avoidance, and a healthy lifestyle. She has mild orthostasis which have also counseled her about, but have urged her to continue her antihypertensives as per primary care. Encounter Diagnosis  Name Primary?  . Esophageal reflux Yes

## 2011-11-01 DIAGNOSIS — I10 Essential (primary) hypertension: Secondary | ICD-10-CM | POA: Diagnosis not present

## 2011-11-01 DIAGNOSIS — N644 Mastodynia: Secondary | ICD-10-CM | POA: Diagnosis not present

## 2011-11-01 DIAGNOSIS — I498 Other specified cardiac arrhythmias: Secondary | ICD-10-CM | POA: Diagnosis not present

## 2011-11-03 ENCOUNTER — Telehealth (HOSPITAL_COMMUNITY): Payer: Self-pay | Admitting: *Deleted

## 2011-11-03 ENCOUNTER — Telehealth: Payer: Self-pay | Admitting: Gastroenterology

## 2011-11-03 MED ORDER — OMEPRAZOLE 40 MG PO CPDR: 40.0000 mg | DELAYED_RELEASE_CAPSULE | Freq: Two times a day (BID) | ORAL | Status: DC

## 2011-11-03 MED ORDER — DICYCLOMINE HCL 10 MG PO CAPS: 10.0000 mg | ORAL_CAPSULE | Freq: Three times a day (TID) | ORAL | Status: DC | PRN
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
## 2011-11-03 NOTE — Telephone Encounter (Signed)
Changed levsin to bentyl and nexium to omperazole bid, pt aware

## 2011-11-03 NOTE — Telephone Encounter (Signed)
Left message to call back  

## 2011-11-04 ENCOUNTER — Encounter (HOSPITAL_COMMUNITY): Payer: Self-pay | Admitting: *Deleted

## 2011-11-04 ENCOUNTER — Emergency Department (HOSPITAL_COMMUNITY)
Admission: EM | Admit: 2011-11-04 | Discharge: 2011-11-04 | Disposition: A | Discharge status: Home / Self Care | Payer: Medicare Other | Attending: Emergency Medicine | Admitting: Emergency Medicine

## 2011-11-04 DIAGNOSIS — K219 Gastro-esophageal reflux disease without esophagitis: Secondary | ICD-10-CM | POA: Diagnosis not present

## 2011-11-04 DIAGNOSIS — R112 Nausea with vomiting, unspecified: Secondary | ICD-10-CM | POA: Insufficient documentation

## 2011-11-04 DIAGNOSIS — F319 Bipolar disorder, unspecified: Secondary | ICD-10-CM | POA: Insufficient documentation

## 2011-11-04 DIAGNOSIS — R109 Unspecified abdominal pain: Secondary | ICD-10-CM | POA: Insufficient documentation

## 2011-11-04 DIAGNOSIS — R197 Diarrhea, unspecified: Secondary | ICD-10-CM | POA: Insufficient documentation

## 2011-11-04 DIAGNOSIS — I10 Essential (primary) hypertension: Secondary | ICD-10-CM | POA: Diagnosis not present

## 2011-11-04 DIAGNOSIS — R000 Tachycardia, unspecified: Secondary | ICD-10-CM | POA: Diagnosis not present

## 2011-11-04 DIAGNOSIS — Z79899 Other long term (current) drug therapy: Secondary | ICD-10-CM | POA: Insufficient documentation

## 2011-11-04 LAB — URINALYSIS, MICROSCOPIC ONLY
Bilirubin Urine: NEGATIVE
Glucose, UA: NEGATIVE mg/dL
Protein, ur: 30 mg/dL — AB
Urobilinogen, UA: 0.2 mg/dL (ref 0.0–1.0)

## 2011-11-04 LAB — BASIC METABOLIC PANEL
Chloride: 106 mEq/L (ref 96–112)
GFR calc Af Amer: >90 mL/min (ref >90)
GFR calc non Af Amer: >90 mL/min (ref >90)
Glucose, Bld: 97 mg/dL (ref 70–99)
Potassium: 3.5 mEq/L (ref 3.5–5.1)
Sodium: 140 mEq/L (ref 135–145)

## 2011-11-04 LAB — CBC
Hemoglobin: 14.6 g/dL (ref 12.0–15.0)
MCHC: 35.6 g/dL (ref 30.0–36.0)
RDW: 13.2 % (ref 11.5–15.5)
WBC: 10.9 10*3/uL — ABNORMAL HIGH (ref 4.0–10.5)

## 2011-11-04 LAB — PREGNANCY, URINE: Preg Test, Ur: NEGATIVE

## 2011-11-04 MED ORDER — ONDANSETRON HCL 4 MG/2ML IJ SOLN
INTRAMUSCULAR | Status: AC
  Filled 2011-11-04: qty 2

## 2011-11-04 MED ORDER — PROMETHAZINE HCL 25 MG PO TABS: 25.0000 mg | ORAL_TABLET | Freq: Four times a day (QID) | ORAL | Status: DC | PRN

## 2011-11-04 MED ORDER — ONDANSETRON HCL 4 MG/2ML IJ SOLN
4.0000 mg | Freq: Once | INTRAMUSCULAR | Status: AC
  Administered 2011-11-04: 4 mg via INTRAVENOUS

## 2011-11-04 MED ORDER — ONDANSETRON HCL 4 MG/2ML IJ SOLN
4.0000 mg | Freq: Once | INTRAMUSCULAR | Status: AC
  Administered 2011-11-04: 4 mg via INTRAVENOUS
  Filled 2011-11-04: qty 2

## 2011-11-04 MED ORDER — SODIUM CHLORIDE 0.9 % IV BOLUS (SEPSIS)
1000.0000 mL | Freq: Once | INTRAVENOUS | Status: AC
  Administered 2011-11-04: 1000 mL via INTRAVENOUS

## 2011-11-04 NOTE — ED Notes (Signed)
The pt has had abd pain with vomiting and diarrhea since 2100.

## 2011-11-04 NOTE — ED Notes (Signed)
Patient is resting comfortably., complains of nausea and vomiting and diarrhea when vomitingsts that has seen md for gerd and was recently taken off her propranolol. Complains of thirst.

## 2011-11-04 NOTE — ED Notes (Signed)
No in/ out needed.  Pt ambulated to the BR.  No complaints of dizziness.  All VS stayed WNL

## 2011-11-04 NOTE — ED Notes (Signed)
The pt is unable to void at present 

## 2011-11-04 NOTE — ED Provider Notes (Signed)
History     CSN: [id number]  Arrival date & time 11/04/11  0501   First MD Initiated Contact with Patient 11/04/11 0541      Chief Complaint  Patient presents with  . Abdominal Pain    Patient is a 36 y.o. female presenting with abdominal pain. The history is provided by the patient.  Abdominal Pain The primary symptoms of the illness include abdominal pain.   the patient reports that she develop severe nausea vomiting diarrhea that began last night.  She's been vomiting through the night.  She reports crampy abdominal pain.  She's had no fever or chills.  She has no recent sick contacts.  She reports you otherwise had a normal day yesterday the symptoms started last night.  Nothing worsens her symptoms.  Nothing improves her symptoms.  Her symptoms are constant.  She reports she is extremely nauseated at this time.  Past Medical History  Diagnosis Date  . Anxiety   . Hypertension   . Bipolar disorder   . GERD (gastroesophageal reflux disease)   . Hiatal hernia     Past Surgical History  Procedure Date  . Cesarean section   . Knee arthroscopy     left  . Tubal ligation   . Endometrial ablation     Family History  Problem Relation Age of Onset  . Stomach cancer Paternal Grandfather   . Diabetes Maternal Grandfather   . Diabetes Paternal Grandmother   . Heart disease Father   . Heart disease Mother   . Colon polyps Brother   . Lung cancer Father   . Kidney disease Maternal Uncle   . Cirrhosis Cousin     alcoholic    History  Substance Use Topics  . Smoking status: Current Everyday Smoker -- 1.0 packs/day  . Smokeless tobacco: Never Used   Comment: form given 10-27-11  . Alcohol Use: No    OB History    Grav Para Term Preterm Abortions TAB SAB Ect Mult Living                  Review of Systems  Gastrointestinal: Positive for abdominal pain.  All other systems reviewed and are negative.    Allergies  Aspirin; Lisinopril; and Minocycline  Home  Medications   Current Outpatient Rx  Name Route Sig Dispense Refill  . LORAZEPAM 1 MG PO TABS  take 1 tablet by mouth at bedtime 30 tablet 0  . OLANZAPINE 15 MG PO TABS Oral Take 15 mg by mouth daily as needed. For anxiousness    . OMEPRAZOLE 40 MG PO CPDR Oral Take 1 capsule (40 mg total) by mouth 2 (two) times daily. 60 capsule 6  . PROPRANOLOL HCL 20 MG PO TABS Oral Take 20 mg by mouth 3 (three) times daily.     . TRAZODONE HCL 150 MG PO TABS Oral Take 75 mg by mouth at bedtime.     Marland Kitchen DICYCLOMINE HCL 10 MG PO CAPS Oral Take 1 capsule (10 mg total) by mouth 3 (three) times daily as needed. 90 capsule 3  . PROMETHAZINE HCL 25 MG PO TABS Oral Take 1 tablet (25 mg total) by mouth every 6 (six) hours as needed for nausea. 12 tablet 0    BP 113/75  Pulse 87  Temp(Src) 98.3 F (36.8 C) (Oral)  Resp 18  SpO2 95%  Physical Exam  Nursing note and vitals reviewed. Constitutional: She is oriented to person, place, and time. She appears well-developed and well-nourished.  No distress.  HENT:  Head: Normocephalic and atraumatic.  Eyes: EOM are normal.  Neck: Normal range of motion.  Cardiovascular: Regular rhythm and normal heart sounds.        Tachycardic  Pulmonary/Chest: Effort normal and breath sounds normal.  Abdominal: Soft. She exhibits no distension. There is no tenderness.  Musculoskeletal: Normal range of motion.  Neurological: She is alert and oriented to person, place, and time.  Skin: Skin is warm and dry.  Psychiatric: She has a normal mood and affect. Judgment normal.    ED Course  Procedures (including critical care time)  Labs Reviewed  CBC - Abnormal; Notable for the following:    WBC 10.9 (*)    All other components within normal limits  PREGNANCY, URINE  BASIC METABOLIC PANEL  URINALYSIS, WITH MICROSCOPIC   No results found.   1. Nausea vomiting and diarrhea       MDM  I suspect this patient is a route gastroenteritis.  She is being hydrated in the ER.   Her pulse rate is coming down.  Her nausea is improved.  Labs otherwise without significant abnormality.  DC home.  Home with prescription for Phenergan        Lyanne Co, MD 11/04/11 [phone]

## 2011-11-04 NOTE — ED Notes (Signed)
She has  Been seen by her doctors on Friday and Tuesday and her meds were changed around.  Her heart has been faster than usual from the med changes

## 2011-11-07 ENCOUNTER — Other Ambulatory Visit: Payer: Self-pay | Admitting: Family Medicine

## 2011-11-07 DIAGNOSIS — N6325 Unspecified lump in the left breast, overlapping quadrants: Secondary | ICD-10-CM

## 2011-11-07 DIAGNOSIS — N6315 Unspecified lump in the right breast, overlapping quadrants: Secondary | ICD-10-CM

## 2011-11-07 DIAGNOSIS — A09 Infectious gastroenteritis and colitis, unspecified: Secondary | ICD-10-CM | POA: Diagnosis not present

## 2011-11-07 DIAGNOSIS — I10 Essential (primary) hypertension: Secondary | ICD-10-CM | POA: Diagnosis not present

## 2011-11-09 ENCOUNTER — Ambulatory Visit (HOSPITAL_COMMUNITY): Payer: Medicare Other | Admitting: Psychiatry

## 2011-11-11 ENCOUNTER — Ambulatory Visit (AMBULATORY_SURGERY_CENTER): Payer: Medicare Other | Admitting: Gastroenterology

## 2011-11-11 ENCOUNTER — Encounter: Payer: Self-pay | Admitting: Gastroenterology

## 2011-11-11 ENCOUNTER — Emergency Department (HOSPITAL_COMMUNITY)
Admission: EM | Admit: 2011-11-11 | Discharge: 2011-11-11 | Disposition: A | Discharge status: Home Health | Payer: Medicare Other | Attending: Emergency Medicine | Admitting: Emergency Medicine

## 2011-11-11 VITALS — BP 154/105 | HR 76 | Temp 99.0°F | Resp 20 | Ht 65.5 in | Wt 180.0 lb

## 2011-11-11 DIAGNOSIS — K219 Gastro-esophageal reflux disease without esophagitis: Secondary | ICD-10-CM

## 2011-11-11 DIAGNOSIS — F319 Bipolar disorder, unspecified: Secondary | ICD-10-CM | POA: Insufficient documentation

## 2011-11-11 DIAGNOSIS — F1020 Alcohol dependence, uncomplicated: Secondary | ICD-10-CM

## 2011-11-11 DIAGNOSIS — F411 Generalized anxiety disorder: Secondary | ICD-10-CM | POA: Diagnosis not present

## 2011-11-11 DIAGNOSIS — R1013 Epigastric pain: Secondary | ICD-10-CM

## 2011-11-11 DIAGNOSIS — R1314 Dysphagia, pharyngoesophageal phase: Secondary | ICD-10-CM | POA: Diagnosis not present

## 2011-11-11 DIAGNOSIS — I10 Essential (primary) hypertension: Secondary | ICD-10-CM | POA: Diagnosis not present

## 2011-11-11 DIAGNOSIS — Z79899 Other long term (current) drug therapy: Secondary | ICD-10-CM | POA: Insufficient documentation

## 2011-11-11 DIAGNOSIS — R1319 Other dysphagia: Secondary | ICD-10-CM

## 2011-11-11 DIAGNOSIS — R51 Headache: Secondary | ICD-10-CM | POA: Insufficient documentation

## 2011-11-11 DIAGNOSIS — F419 Anxiety disorder, unspecified: Secondary | ICD-10-CM

## 2011-11-11 DIAGNOSIS — K5900 Constipation, unspecified: Secondary | ICD-10-CM

## 2011-11-11 DIAGNOSIS — R1310 Dysphagia, unspecified: Secondary | ICD-10-CM

## 2011-11-11 DIAGNOSIS — I119 Hypertensive heart disease without heart failure: Secondary | ICD-10-CM | POA: Diagnosis not present

## 2011-11-11 MED ORDER — LORAZEPAM 1 MG PO TABS
1.0000 mg | ORAL_TABLET | Freq: Once | ORAL | Status: AC
  Administered 2011-11-11: 1 mg via ORAL
  Filled 2011-11-11: qty 1

## 2011-11-11 MED ORDER — SODIUM CHLORIDE 0.9 % IV SOLN: 500.0000 mL | INTRAVENOUS | Status: DC

## 2011-11-11 NOTE — Discharge Instructions (Signed)
Hypertension       Hypertension is another name for high blood pressure. High blood pressure may mean that your heart needs to work harder to pump blood. Blood pressure consists of two numbers, which includes a higher number over a lower number (example: 110/72).     HOME CARE     Make lifestyle changes as told by your doctor. This may include weight loss and exercise.   Take your blood pressure medicine every day.   Limit how much salt you use.   Stop smoking if you smoke.   Do not use drugs.   Talk to your doctor if you are using decongestants or birth control pills. These medicines might make blood pressure higher.   Females should not drink more than 1 alcoholic drink per day. Males should not drink more than 2 alcoholic drinks per day.   See your doctor as told.    GET HELP RIGHT AWAY IF:     You have a blood pressure reading with a top number of 180 or higher.   You get a very bad headache.   You get blurred or changing vision.   You feel confused.   You feel weak, numb, or faint.   You get chest or belly (abdominal) pain.   You throw up (vomit).   You cannot breathe very well.    MAKE SURE YOU:     Understand these instructions.   Will watch your condition.   Will get help right away if you are not doing well or get worse.      Document Released: 02/08/2008 Document Revised: 08/11/2011 Document Reviewed: 02/08/2008   [employer] Patient Information 2012 [employer]

## 2011-11-11 NOTE — ED Provider Notes (Signed)
History     CSN: [id number]  Arrival date & time 11/11/11  1040   First MD Initiated Contact with Patient 11/11/11 1110      Chief Complaint  Patient presents with  . Headache  . Hypertension    (Consider location/radiation/quality/duration/timing/severity/associated sxs/prior treatment) Patient is a 36 y.o. female presenting with hypertension. The history is provided by the patient.  Hypertension This is a chronic problem. The current episode started more than 2 days ago. The problem occurs every several days. The problem has been gradually improving. Associated symptoms include headaches. Pertinent negatives include no chest pain, no abdominal pain and no shortness of breath. The symptoms are aggravated by stress. The symptoms are relieved by lying down, NSAIDs and relaxation. She has tried acetaminophen for the symptoms. The treatment provided moderate relief.    Past Medical History  Diagnosis Date  . Anxiety   . Hypertension   . Bipolar disorder   . GERD (gastroesophageal reflux disease)   . Hiatal hernia     Past Surgical History  Procedure Date  . Cesarean section   . Knee arthroscopy     left  . Tubal ligation   . Endometrial ablation     Family History  Problem Relation Age of Onset  . Stomach cancer Paternal Grandfather   . Diabetes Maternal Grandfather   . Diabetes Paternal Grandmother   . Heart disease Father   . Heart disease Mother   . Colon polyps Brother   . Lung cancer Father   . Kidney disease Maternal Uncle   . Cirrhosis Cousin     alcoholic    History  Substance Use Topics  . Smoking status: Current Everyday Smoker -- 1.0 packs/day    Types: Cigarettes  . Smokeless tobacco: Never Used   Comment: form given 10-27-11  . Alcohol Use: No    OB History    Grav Para Term Preterm Abortions TAB SAB Ect Mult Living                  Review of Systems  Respiratory: Negative for shortness of breath.   Cardiovascular: Negative for chest pain.   Gastrointestinal: Negative for abdominal pain.  Neurological: Positive for headaches.  All other systems reviewed and are negative.    Allergies  Aspirin and Minocycline  Home Medications   Current Outpatient Rx  Name Route Sig Dispense Refill  . LORAZEPAM 1 MG PO TABS Oral Take 1 mg by mouth at bedtime. For anxiety/sleep    . OLANZAPINE 15 MG PO TABS Oral Take 7.5 mg by mouth daily as needed. She takes 1/2 tablet For anxiousness    . OMEPRAZOLE 40 MG PO CPDR Oral Take 1 capsule (40 mg total) by mouth 2 (two) times daily. 60 capsule 6  . PROMETHAZINE HCL 25 MG PO TABS Oral Take 1 tablet (25 mg total) by mouth every 6 (six) hours as needed for nausea. 12 tablet 0  . PROPRANOLOL HCL 20 MG PO TABS Oral Take 20 mg by mouth 3 (three) times daily.     . TRAZODONE HCL 150 MG PO TABS Oral Take 75 mg by mouth at bedtime. She takes 1/2 tablet but if she wakes up and needs to she will take the other half.    Marland Kitchen DICYCLOMINE HCL 10 MG PO CAPS Oral Take 1 capsule (10 mg total) by mouth 3 (three) times daily as needed. 90 capsule 3  . LISINOPRIL-HYDROCHLOROTHIAZIDE 20-25 MG PO TABS Oral Take 1 tablet by mouth  daily.    Marland Kitchen ONE-DAILY MULTI VITAMINS PO TABS Oral Take 1 tablet by mouth daily.      BP 135/89  Pulse 75  Temp(Src) 98.8 F (37.1 C) (Oral)  Resp 18  SpO2 100%  Physical Exam  Nursing note and vitals reviewed. Constitutional: She is oriented to person, place, and time. She appears well-developed and well-nourished. No distress.  HENT:  Head: Normocephalic and atraumatic.  Eyes: Pupils are equal, round, and reactive to light.  Neck: Normal range of motion.  Cardiovascular: Normal rate and intact distal pulses.   Pulmonary/Chest: No respiratory distress.  Abdominal: Normal appearance. She exhibits no distension.  Musculoskeletal: Normal range of motion.  Neurological: She is alert and oriented to person, place, and time. No cranial nerve deficit.  Skin: Skin is warm and dry. No rash  noted.  Psychiatric: She has a normal mood and affect. Her behavior is normal.      ED Course  Procedures (including critical care time) After treatment in the ED the patient feels back to baseline and wants to go home.  Labs Reviewed - No data to display No results found.   1. Hypertension   2. Anxiety       MDM          Sandra Shi, MD 11/13/11 [phone]

## 2011-11-11 NOTE — Op Note (Signed)
[facility] [address] [place], [place]  [address]  ENDOSCOPY PROCEDURE REPORT  PATIENT:  Sandra Miller, Sandra Miller  MR#:  [id number] BIRTHDATE:  [birth year], 35 yrs. old  GENDER:  female  ENDOSCOPIST:  Vania Rea. Jarold Motto, MD, [facility] Referred by:  PROCEDURE DATE:  11/11/2011 PROCEDURE:  EGD with biopsy for H. pylori 25366 ASA CLASS:  Class II INDICATIONS:  dysphagia  MEDICATIONS:   promethazine (Phenergan) 180 mg IV TOPICAL ANESTHETIC:  DESCRIPTION OF PROCEDURE:   After the risks and benefits of the procedure were explained, informed consent was obtained.  The [facility] GIF-H180 [id number] endoscope was introduced through the mouth and advanced to the second portion of the duodenum.  The instrument was slowly withdrawn as the mucosa was fully examined. <<PROCEDUREIMAGES>>  Normal GE junction was noted. DILATED #72F MALONEY DILATOR.NO HEME OR PAIN.  Normal duodenal folds were noted.  The stomach was entered and closely examined. The antrum, angularis, and lesser curvature were well visualized, including a retroflexed view of the cardia and fundus. The stomach wall was normally distensable. The scope passed easily through the pylorus into the duodenum. LAGR HEALTHY RUGAE    Retroflexed views revealed no abnormalities. The scope was then withdrawn from the patient and the procedure completed.  COMPLICATIONS:  None  ENDOSCOPIC IMPRESSION: 1) Normal GE junction 2) Normal duodenal folds 3) Normal stomach ?? OCCULT STRICTURE FROM GERD. RECOMMENDATIONS:  ______________________________ Vania Rea. Jarold Motto, MD, [facility]  CC:  n. eSIGNED:   Vania Rea. Jacqulyne Gladue at 11/11/2011 02:26 PM  Greer Ee, [phone]

## 2011-11-11 NOTE — Progress Notes (Signed)
Patient did not experience any of the following events: a burn prior to discharge; a fall within the facility; wrong site/side/patient/procedure/implant event; or a hospital transfer or hospital admission upon discharge from the facility. (G8907) Patient did not have preoperative order for IV antibiotic SSI prophylaxis. (G8918)  

## 2011-11-11 NOTE — Progress Notes (Signed)
Patient states she was in the E.R. Today for high bp. BP checked. Patient states she was given lisinopril&hctz and ativan po.

## 2011-11-11 NOTE — Patient Instructions (Signed)
Discharge instructions given with verbal understanding. Handout on a dilatation diet given. Resume previous medications. YOU HAD AN ENDOSCOPIC PROCEDURE TODAY AT THE [facility]: Refer to the procedure report that was given to you for any specific questions about what was found during the examination.  If the procedure report does not answer your questions, please call your gastroenterologist to clarify.  If you requested that your care partner not be given the details of your procedure findings, then the procedure report has been included in a sealed envelope for you to review at your convenience later.  YOU SHOULD EXPECT: Some feelings of bloating in the abdomen. Passage of more gas than usual.  Walking can help get rid of the air that was put into your GI tract during the procedure and reduce the bloating. If you had a lower endoscopy (such as a colonoscopy or flexible sigmoidoscopy) you may notice spotting of blood in your stool or on the toilet paper. If you underwent a bowel prep for your procedure, then you may not have a normal bowel movement for a few days.  DIET: Your first meal following the procedure should be a light meal and then it is ok to progress to your normal diet.  A half-sandwich or bowl of soup is an example of a good first meal.  Heavy or fried foods are harder to digest and may make you feel nauseous or bloated.  Likewise meals heavy in dairy and vegetables can cause extra gas to form and this can also increase the bloating.  Drink plenty of fluids but you should avoid alcoholic beverages for 24 hours.  ACTIVITY: Your care partner should take you home directly after the procedure.  You should plan to take it easy, moving slowly for the rest of the day.  You can resume normal activity the day after the procedure however you should NOT DRIVE or use heavy machinery for 24 hours (because of the sedation medicines used during the test).    SYMPTOMS TO REPORT  IMMEDIATELY: A gastroenterologist can be reached at any hour.  During normal business hours, 8:30 AM to 5:00 PM Monday through Friday, call [phone].  After hours and on weekends, please call the GI answering service at [phone] who will take a message and have the physician on call contact you.   Following upper endoscopy (EGD)  Vomiting of blood or coffee ground material  New chest pain or pain under the shoulder blades  Painful or persistently difficult swallowing  New shortness of breath  Fever of 100F or higher  Black, tarry-looking stools  FOLLOW UP: If any biopsies were taken you will be contacted by phone or by letter within the next 1-3 weeks.  Call your gastroenterologist if you have not heard about the biopsies in 3 weeks.  Our staff will call the home number listed on your records the next business day following your procedure to check on you and address any questions or concerns that you may have at that time regarding the information given to you following your procedure. This is a courtesy call and so if there is no answer at the home number and we have not heard from you through the emergency physician on call, we will assume that you have returned to your regular daily activities without incident.  SIGNATURES/CONFIDENTIALITY: You and/or your care partner have signed paperwork which will be entered into your electronic medical record.  These signatures attest to the fact that that the information   above on your After Visit Summary has been reviewed and is understood.  Full responsibility of the confidentiality of this discharge information lies with you and/or your care-partner. 

## 2011-11-14 ENCOUNTER — Telehealth: Payer: Self-pay

## 2011-11-14 NOTE — Telephone Encounter (Signed)
  Follow up Call-  Call back number 11/11/2011  Post procedure Call Back phone  # [phone]  Permission to leave phone message Yes     Patient questions:  Do you have a fever, pain , or abdominal swelling? no Pain Score  0 *  Have you tolerated food without any problems? yes  Have you been able to return to your normal activities? yes  Do you have any questions about your discharge instructions: Diet   no Medications  no Follow up visit  no  Do you have questions or concerns about your Care? no  Actions: * If pain score is 4 or above: No action needed, pain <4.

## 2011-11-15 ENCOUNTER — Encounter (HOSPITAL_COMMUNITY): Payer: Self-pay | Admitting: *Deleted

## 2011-11-18 ENCOUNTER — Other Ambulatory Visit: Payer: Medicare Other

## 2011-11-18 ENCOUNTER — Ambulatory Visit
Admission: RE | Admit: 2011-11-18 | Discharge: 2011-11-18 | Disposition: A | Discharge status: Home / Self Care | Payer: Medicare Other | Source: Ambulatory Visit | Attending: Family Medicine | Admitting: Family Medicine

## 2011-11-18 DIAGNOSIS — N6315 Unspecified lump in the right breast, overlapping quadrants: Secondary | ICD-10-CM

## 2011-11-18 DIAGNOSIS — N6325 Unspecified lump in the left breast, overlapping quadrants: Secondary | ICD-10-CM

## 2011-11-18 DIAGNOSIS — N6459 Other signs and symptoms in breast: Secondary | ICD-10-CM | POA: Diagnosis not present

## 2011-11-18 DIAGNOSIS — N644 Mastodynia: Secondary | ICD-10-CM | POA: Diagnosis not present

## 2011-11-21 ENCOUNTER — Encounter (HOSPITAL_COMMUNITY): Payer: Self-pay | Admitting: Psychiatry

## 2011-11-21 ENCOUNTER — Ambulatory Visit (INDEPENDENT_AMBULATORY_CARE_PROVIDER_SITE_OTHER): Payer: Medicare Other | Admitting: Psychiatry

## 2011-11-21 VITALS — BP 145/108 | HR 88 | Wt 181.0 lb

## 2011-11-21 DIAGNOSIS — F319 Bipolar disorder, unspecified: Secondary | ICD-10-CM

## 2011-11-21 MED ORDER — OLANZAPINE 15 MG PO TABS: 15.0000 mg | ORAL_TABLET | Freq: Every day | ORAL | Status: DC | PRN

## 2011-11-21 MED ORDER — LORAZEPAM 1 MG PO TABS: 1.0000 mg | ORAL_TABLET | Freq: Every day | ORAL | Status: DC

## 2011-11-21 NOTE — Progress Notes (Signed)
Chief complaint I continued to have issues with my blood pressure  History of present illness Patient is 36 year old female who came for her followup appointment. She's been compliant with her psychiatric medication however she continues to have issues with her but pressure and physical symptoms. She went to emergency room and she had a panic attack and her blood pressure was very high. Her primary care physician recently reduce her hypertensive medication. However when she went to emergency room, the emergency room physician restarted Inderal. Today she has a high blood pressure however she realized she is been drinking coffee all day. She is scheduled to see her primary care physician soon and she will discuss again her blood pressure. Overall she is less depressed and less tearful. She denies any agitation anger or mood swings. She likes her current psychiatric medication. 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 She denies any concern with her medication. She is not drinking or smoking marijuana.  Current psychiatric medication Zyprexa 15 mg at bedtime Trazodone 150 mg, half to 1 tablet as needed Ativan 1 mg at bedtime  Past psychiatric history Patient has history of psychiatric inpatient treatment, she has a history of impulsive behavior agitation anger paranoia and psychotic episodes.  Medical history Patient has history of hypertension has been an issue in past few months. She had a history of GERD. She has recently seen her primary care physician many times to manage her blood pressure.  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 She denies any drinking or using drugs. He  Mental status examination Patient is casually dressed fairly groomed. She is anxious but cooperative and pleasant. Her speech is soft clear and coherent. Her thought process logical linear and goal-directed. She has any active or passive suicidal  thoughts or homicidal thoughts. There no psychotic symptoms present. She's alert and oriented x3. Her insight judgment and impulse control is okay.  Assessment Axis I Bipolar disorder Axis II deferred Axis III see medical history Axis IV moderate  Plan I review her medication, psychosocial stressor, medication and last progress note. At this time she is stable psychiatrically on her medication. I encourage her to see her physician for adjustment of blood pressure medication. I explained risks and benefits of medication in detail. I will see her again in 2 months. Time spent 30 minutes.

## 2011-11-30 DIAGNOSIS — L0390 Cellulitis, unspecified: Secondary | ICD-10-CM | POA: Diagnosis not present

## 2011-11-30 DIAGNOSIS — L0291 Cutaneous abscess, unspecified: Secondary | ICD-10-CM | POA: Diagnosis not present

## 2011-12-16 ENCOUNTER — Ambulatory Visit (INDEPENDENT_AMBULATORY_CARE_PROVIDER_SITE_OTHER): Payer: Medicare Other | Admitting: Psychology

## 2011-12-16 DIAGNOSIS — F3161 Bipolar disorder, current episode mixed, mild: Secondary | ICD-10-CM

## 2011-12-16 NOTE — Progress Notes (Signed)
   THERAPIST PROGRESS NOTE  Session Time: 1100 - 1145  Participation Level: Active  Behavioral Response: Neat and Well GroomedAlertAnxious  Type of Therapy: Individual Therapy  Treatment Goals addressed: Anxiety and Coping  Interventions: Supportive and Other: critical thinking  Summary: Sandra Miller is a 36 y.o. female who presents with pressured speech, broad affect, and rapid shifts from the good things going on in her life to the things that are stressing her out. The underlying problem is limited financial resources.  Because she is on SSD, she can only make $1000/month.  But her SSD is only 430. So she is considering giving up the SSD and working more.  Only issue is that of health insurance and medication coverage.  With regard to her medication, she has not been completely honest with Dr. Lolly Mustache.  Sometimes, most of the time, in fact, when he gives her a new medication to try, she never takes it because she becomes too frightened by the potential for side effects.  With our electronic records, she sees this will be found out, because she does tell her PCP what she is doing. The obsession to read all the warnings has so far gotten in the way of her finding the best treatment.  "Zyprexa works best, because it is the only one I have ever taken."  Even that, she had stopped for a while and lost 12 lbs, but then found herself getting "snappy" and resumed taking the 1/2 dose and now is back to full dose.  She is also regaining the weight.  She has determined to tell him the truth and try something that might not contribute so much to weight gain.  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 She is also giving up her house and moving to an apartment in July to decrease her expenses.   Suicidal/Homicidal: Nowithout intent/plan  Therapist Response:   Plan: Return  again in 2 weeks.  Diagnosis: Axis I: Bipolar, mixed    Axis II: Obsessive- Compulsive Personality traits    Umi Mainor, RN 12/16/2011

## 2011-12-31 ENCOUNTER — Encounter (HOSPITAL_COMMUNITY): Payer: Self-pay | Admitting: Cardiology

## 2011-12-31 ENCOUNTER — Emergency Department (HOSPITAL_COMMUNITY)
Admission: EM | Admit: 2011-12-31 | Discharge: 2011-12-31 | Disposition: A | Discharge status: Home / Self Care | Payer: Medicare Other | Attending: Emergency Medicine | Admitting: Emergency Medicine

## 2011-12-31 DIAGNOSIS — F411 Generalized anxiety disorder: Secondary | ICD-10-CM | POA: Diagnosis not present

## 2011-12-31 DIAGNOSIS — F319 Bipolar disorder, unspecified: Secondary | ICD-10-CM | POA: Diagnosis not present

## 2011-12-31 DIAGNOSIS — Z79899 Other long term (current) drug therapy: Secondary | ICD-10-CM | POA: Diagnosis not present

## 2011-12-31 DIAGNOSIS — K219 Gastro-esophageal reflux disease without esophagitis: Secondary | ICD-10-CM | POA: Diagnosis not present

## 2011-12-31 DIAGNOSIS — I6789 Other cerebrovascular disease: Secondary | ICD-10-CM | POA: Diagnosis not present

## 2011-12-31 DIAGNOSIS — I10 Essential (primary) hypertension: Secondary | ICD-10-CM | POA: Diagnosis not present

## 2011-12-31 DIAGNOSIS — R51 Headache: Secondary | ICD-10-CM | POA: Insufficient documentation

## 2011-12-31 DIAGNOSIS — R42 Dizziness and giddiness: Secondary | ICD-10-CM | POA: Diagnosis not present

## 2011-12-31 DIAGNOSIS — J322 Chronic ethmoidal sinusitis: Secondary | ICD-10-CM | POA: Diagnosis not present

## 2011-12-31 DIAGNOSIS — E876 Hypokalemia: Secondary | ICD-10-CM | POA: Diagnosis not present

## 2011-12-31 MED ORDER — METOCLOPRAMIDE HCL 5 MG/ML IJ SOLN
10.0000 mg | Freq: Once | INTRAMUSCULAR | Status: AC
  Administered 2011-12-31: 10 mg via INTRAVENOUS
  Filled 2011-12-31: qty 2

## 2011-12-31 MED ORDER — DIPHENHYDRAMINE HCL 50 MG/ML IJ SOLN
12.5000 mg | Freq: Once | INTRAMUSCULAR | Status: AC
  Administered 2011-12-31: 12.5 mg via INTRAVENOUS
  Filled 2011-12-31: qty 1

## 2011-12-31 MED ORDER — KETOROLAC TROMETHAMINE 30 MG/ML IJ SOLN
30.0000 mg | Freq: Once | INTRAMUSCULAR | Status: AC
  Administered 2011-12-31: 30 mg via INTRAVENOUS
  Filled 2011-12-31: qty 1

## 2011-12-31 NOTE — ED Notes (Signed)
Pt to department via EMS from home- reports that she has a hx of hypertension and recently been having headaches. States that she has also been seeing spots today. Denies any blurred vision. Bp- 190/106 Hr-90

## 2011-12-31 NOTE — Discharge Instructions (Signed)
It is very important to take over-the-counter pain medication immediately at the onset of headache. May alternate between Tylenol and Advil based products for pain. Followup with your primary care provider in one week for further evaluation and management of your blood pressure. Return to emergency department for any emergent changing or worsening symptoms.  General Headache, Without Cause A general headache has no specific cause. These headaches are not life-threatening. They will not lead to other types of headaches. HOME CARE   Make and keep follow-up visits with your doctor.   Only take medicine as told by your doctor.   Try to relax, get a massage, or use your thoughts to control your body (biofeedback).   Apply cold or heat to the head and neck. Apply 3 or 4 times a day or as needed.  Finding out the results of your test Ask when your test results will be ready. Make sure you get your test results. GET HELP RIGHT AWAY IF:   You have problems with medicine.   Your medicine does not help relieve pain.   Your headache changes or becomes worse.   You feel sick to your stomach (nauseous) or throw up (vomit).   You have a temperature by mouth above 102 F (38.9 C), not controlled by medicine.   Your have a stiff neck.   You have vision loss.   You have muscle weakness.   You lose control of your muscles.   You lose balance or have trouble walking.   You feel like you are going to pass out (faint).  MAKE SURE YOU:   Understand these instructions.   Will watch this condition.   Will get help right away if you are not doing well or get worse.  Document Released: 05/31/2008 Document Revised: 08/11/2011 Document Reviewed: 05/31/2008 [facility] Patient Information 2012 [place], [place].  Hypertension Hypertension is another name for high blood pressure. High blood pressure may mean that your heart needs to work harder to pump blood. Blood pressure consists of two numbers,  which includes a higher number over a lower number (example: 110/72). HOME CARE   Make lifestyle changes as told by your doctor. This may include weight loss and exercise.   Take your blood pressure medicine every day.   Limit how much salt you use.   Stop smoking if you smoke.   Do not use drugs.   Talk to your doctor if you are using decongestants or birth control pills. These medicines might make blood pressure higher.   Females should not drink more than 1 alcoholic drink per day. Males should not drink more than 2 alcoholic drinks per day.   See your doctor as told.  GET HELP RIGHT AWAY IF:   You have a blood pressure reading with a top number of 180 or higher.   You get a very bad headache.   You get blurred or changing vision.   You feel confused.   You feel weak, numb, or faint.   You get chest or belly (abdominal) pain.   You throw up (vomit).   You cannot breathe very well.  MAKE SURE YOU:   Understand these instructions.   Will watch your condition.   Will get help right away if you are not doing well or get worse.  Document Released: 02/08/2008 Document Revised: 08/11/2011 Document Reviewed: 02/08/2008 [facility] Patient Information 2012 [place], [place].

## 2011-12-31 NOTE — ED Provider Notes (Signed)
History     CSN: [id number]  Arrival date & time 12/31/11  1138   First MD Initiated Contact with Patient 12/31/11 1140      Chief Complaint  Patient presents with  . Headache  . Hypertension    (Consider location/radiation/quality/duration/timing/severity/associated sxs/prior treatment) HPI  Patient presents to emergency department by EMS complaining of a gradual onset headache that has been constant and persistent times one week and elevated BP. Patient states the headache is in the posterior aspect of her head and is throbbing. Patient states that over the last week she's been taking Tylenol daily for headache without relief of symptoms. Patient states that she left work today because of headache and went home and took her blood pressure. Patient states her blood pressure was 217/120 and patient states "I started to freak out because my blood pressure is so high." Patient states that when she began to "freak out" she began to "see spots and get dizzy". Patient states this lasted for a few seconds and then resolved. Patient states that she took a dose of HCTZ/lisinopril which was an "old prescription." Patient states her primary care providers at [facility] have been "playing around with my medications to try and get a blood pressure right." Patient states she's currently on propanolol 3 times daily and was told to stop her hydrochlorothiazide/lisinopril. Patient states she had left over medication and when her blood pressure was high she took an extra dose. Patient is brought to ER by EMS with EMS reporting blood pressure 190/106 with a heart rate of 90. Patient states that she has calmed down since arrival to the ER with some improvement of headache but still constant ongoing headache. She denies any fevers, chills, difficulty ambulating, loss of coordination, neck stiffness or rash.  Past Medical History  Diagnosis Date  . Anxiety   . Hypertension   . Bipolar disorder   . GERD  (gastroesophageal reflux disease)   . Hiatal hernia     Past Surgical History  Procedure Date  . Cesarean section   . Knee arthroscopy     left  . Tubal ligation   . Endometrial ablation     Family History  Problem Relation Age of Onset  . Stomach cancer Paternal Grandfather   . Diabetes Maternal Grandfather   . Diabetes Paternal Grandmother   . Heart disease Father   . Lung cancer Father   . Heart disease Mother   . Depression Mother   . Colon polyps Brother   . Kidney disease Maternal Uncle   . Cirrhosis Cousin     alcoholic  . Anxiety disorder Maternal Aunt   . Depression Maternal Aunt     History  Substance Use Topics  . Smoking status: Current Everyday Smoker -- 1.0 packs/day    Types: Cigarettes  . Smokeless tobacco: Never Used   Comment: form given 10-27-11  . Alcohol Use: No    OB History    Grav Para Term Preterm Abortions TAB SAB Ect Mult Living                  Review of Systems  All other systems reviewed and are negative.    Allergies  Aspirin and Minocycline  Home Medications   Current Outpatient Rx  Name Route Sig Dispense Refill  . DICYCLOMINE HCL 10 MG PO CAPS Oral Take 1 capsule (10 mg total) by mouth 3 (three) times daily as needed. 90 capsule 3  . LISINOPRIL-HYDROCHLOROTHIAZIDE 20-25 MG PO TABS  Oral Take 1 tablet by mouth daily.    Marland Kitchen LORAZEPAM 1 MG PO TABS Oral Take 1 tablet (1 mg total) by mouth at bedtime. For anxiety/sleep 30 tablet 1  . ONE-DAILY MULTI VITAMINS PO TABS Oral Take 1 tablet by mouth daily.    Marland Kitchen OLANZAPINE 15 MG PO TABS Oral Take 1 tablet (15 mg total) by mouth daily as needed. 30 tablet 1  . OMEPRAZOLE 40 MG PO CPDR Oral Take 1 capsule (40 mg total) by mouth 2 (two) times daily. 60 capsule 6  . PROMETHAZINE HCL 25 MG PO TABS Oral Take 1 tablet (25 mg total) by mouth every 6 (six) hours as needed for nausea. 12 tablet 0  . PROPRANOLOL HCL 20 MG PO TABS Oral Take 20 mg by mouth 3 (three) times daily.     . TRAZODONE  HCL 150 MG PO TABS Oral Take 75 mg by mouth at bedtime. She takes 1/2 tablet but if she wakes up and needs to she will take the other half.      BP 128/75  Pulse 78  Temp(Src) 98.8 F (37.1 C) (Oral)  Resp 15  SpO2 100%  Physical Exam  Nursing note and vitals reviewed. Constitutional: She is oriented to person, place, and time. She appears well-developed and well-nourished. No distress.  HENT:  Head: Normocephalic and atraumatic.  Eyes: Conjunctivae and EOM are normal. Pupils are equal, round, and reactive to light.  Neck: Normal range of motion. Neck supple.       No meningeal signs.   Cardiovascular: Normal rate, regular rhythm, normal heart sounds and intact distal pulses.  Exam reveals no gallop and no friction rub.   No murmur heard. Pulmonary/Chest: Effort normal and breath sounds normal. No respiratory distress. She has no wheezes. She has no rales. She exhibits no tenderness.  Abdominal: Soft. Bowel sounds are normal. She exhibits no distension and no mass. There is no tenderness. There is no rebound and no guarding.  Musculoskeletal: Normal range of motion. She exhibits no edema and no tenderness.  Neurological: She is alert and oriented to person, place, and time. No cranial nerve deficit. Coordination normal.  Skin: Skin is warm and dry. No rash noted. She is not diaphoretic. No erythema.  Psychiatric: She has a normal mood and affect.    ED Course  Procedures (including critical care time)  IV Toradol, Reglan, and Benadryl.  The patient is ambulating about the department without difficulty. No ataxia.  Labs Reviewed - No data to display No results found.   1. Hypertension   2. Headache       MDM  Patient has no signs or symptoms in organ damage with hypertensive urgency or emergency. With headache resolved patient's blood pressure has remained stable and normal throughout ER stay. Patient states headache has resolved. She is ambulating without difficulty with  no neuro focal findings. Is afebrile and nontoxic-appearing. She has no meningeal signs. Patient is agreeable to following up with primary care provider next week to further discuss her blood pressure management.        Jenness Corner, [place] 12/31/11 1554

## 2012-01-01 ENCOUNTER — Emergency Department (HOSPITAL_COMMUNITY)
Admission: EM | Admit: 2012-01-01 | Discharge: 2012-01-01 | Disposition: A | Discharge status: Home / Self Care | Payer: Medicare Other | Attending: Emergency Medicine | Admitting: Emergency Medicine

## 2012-01-01 ENCOUNTER — Emergency Department (HOSPITAL_COMMUNITY): Payer: Medicare Other

## 2012-01-01 ENCOUNTER — Encounter (HOSPITAL_COMMUNITY): Payer: Self-pay | Admitting: Physical Medicine and Rehabilitation

## 2012-01-01 DIAGNOSIS — I10 Essential (primary) hypertension: Secondary | ICD-10-CM

## 2012-01-01 DIAGNOSIS — E876 Hypokalemia: Secondary | ICD-10-CM | POA: Diagnosis not present

## 2012-01-01 DIAGNOSIS — R51 Headache: Secondary | ICD-10-CM | POA: Insufficient documentation

## 2012-01-01 DIAGNOSIS — F411 Generalized anxiety disorder: Secondary | ICD-10-CM | POA: Insufficient documentation

## 2012-01-01 DIAGNOSIS — Z79899 Other long term (current) drug therapy: Secondary | ICD-10-CM | POA: Diagnosis not present

## 2012-01-01 DIAGNOSIS — J322 Chronic ethmoidal sinusitis: Secondary | ICD-10-CM | POA: Diagnosis not present

## 2012-01-01 DIAGNOSIS — F419 Anxiety disorder, unspecified: Secondary | ICD-10-CM

## 2012-01-01 DIAGNOSIS — R6889 Other general symptoms and signs: Secondary | ICD-10-CM | POA: Diagnosis not present

## 2012-01-01 LAB — POCT I-STAT, CHEM 8
Calcium, Ion: 1.22 mmol/L (ref 1.12–1.32)
Creatinine, Ser: 0.9 mg/dL (ref 0.50–1.10)
Glucose, Bld: 90 mg/dL (ref 70–99)
Hemoglobin: 15.6 g/dL — ABNORMAL HIGH (ref 12.0–15.0)
TCO2: 29 mmol/L (ref 0–100)

## 2012-01-01 MED ORDER — HYDROCHLOROTHIAZIDE 25 MG PO TABS
25.0000 mg | ORAL_TABLET | Freq: Every day | ORAL | Status: DC
  Administered 2012-01-01: 25 mg via ORAL
  Filled 2012-01-01: qty 1

## 2012-01-01 MED ORDER — HYDROCHLOROTHIAZIDE 25 MG PO TABS: 25.0000 mg | ORAL_TABLET | Freq: Every day | ORAL | Status: DC

## 2012-01-01 MED ORDER — PROPRANOLOL HCL 20 MG PO TABS
20.0000 mg | ORAL_TABLET | Freq: Once | ORAL | Status: AC
  Administered 2012-01-01: 20 mg via ORAL
  Filled 2012-01-01: qty 1

## 2012-01-01 MED ORDER — POTASSIUM CHLORIDE CRYS ER 20 MEQ PO TBCR
40.0000 meq | EXTENDED_RELEASE_TABLET | Freq: Once | ORAL | Status: AC
  Administered 2012-01-01: 40 meq via ORAL
  Filled 2012-01-01: qty 2

## 2012-01-01 MED ORDER — KETOROLAC TROMETHAMINE 30 MG/ML IJ SOLN
30.0000 mg | Freq: Once | INTRAMUSCULAR | Status: AC
  Administered 2012-01-01: 30 mg via INTRAVENOUS
  Filled 2012-01-01: qty 1

## 2012-01-01 MED ORDER — POTASSIUM CHLORIDE ER 10 MEQ PO TBCR: 20.0000 meq | EXTENDED_RELEASE_TABLET | Freq: Two times a day (BID) | ORAL | Status: DC

## 2012-01-01 NOTE — ED Notes (Signed)
Pt presents to department for evaluation of headaches and hypertension. Pt states this has been an issue x2 months, was recently just seen here for same. No relief of headache with OTC medications at home. BP 180/100 this morning. Pt states she takes medications as prescribed. She is conscious alert and oriented x4.

## 2012-01-01 NOTE — ED Provider Notes (Signed)
Medical screening examination/treatment/procedure(s) were performed by non-physician practitioner and as supervising physician I was immediately available for consultation/collaboration.  Juliet Rude. Rubin Payor, MD 01/01/12 [phone]

## 2012-01-01 NOTE — Discharge Instructions (Signed)
Start hydrochlorothiazide once again in addition to propranolol. It is very important to keep a daily diary of your blood pressure, taking your blood pressure at the same time every day so that your primary care provider can further evaluate and manage your blood pressure more accurately with the new start of medication. Alternate between Tylenol and ibuprofen as needed for pain. Take potassium supplement as directed and have your primary care provider recheck of potassium at followup on May 6. Your followup on May 6 is very important for ongoing management of your recurrent headaches, hypertension, anxiety, and low potassium. Return to emergency Department any time for emergent changing or worsening symptoms.  Headache, General, Unknown Cause The specific cause of your headache may not have been found today. There are many causes and types of headache. A few common ones are:  Tension headache.   Migraine.   Infections (examples: dental and sinus infections).   Bone and/or joint problems in the neck or jaw.   Depression.   Eye problems.  These headaches are not life threatening.  Headaches can sometimes be diagnosed by a patient history and a physical exam. Sometimes, lab and imaging studies (such as x-ray and/or CT scan) are used to rule out more serious problems. In some cases, a spinal tap (lumbar puncture) may be requested. There are many times when your exam and tests may be normal on the first visit even when there is a serious problem causing your headaches. Because of that, it is very important to follow up with your doctor or local clinic for further evaluation. FINDING OUT THE RESULTS OF TESTS  If a radiology test was performed, a radiologist will review your results.   You will be contacted by the emergency department or your physician if any test results require a change in your treatment plan.   Not all test results may be available during your visit. If your test results are not  back during the visit, make an appointment with your caregiver to find out the results. Do not assume everything is normal if you have not heard from your caregiver or the medical facility. It is important for you to follow up on all of your test results.  HOME CARE INSTRUCTIONS   Keep follow-up appointments with your caregiver, or any specialist referral.   Only take over-the-counter or prescription medicines for pain, discomfort, or fever as directed by your caregiver.   Biofeedback, massage, or other relaxation techniques may be helpful.   Ice packs or heat applied to the head and neck can be used. Do this three to four times per day, or as needed.   Call your doctor if you have any questions or concerns.   If you smoke, you should quit.  SEEK MEDICAL CARE IF:   You develop problems with medications prescribed.   You do not respond to or obtain relief from medications.   You have a change from the usual headache.   You develop nausea or vomiting.  SEEK IMMEDIATE MEDICAL CARE IF:   If your headache becomes severe.   You have an unexplained oral temperature above 102 F (38.9 C), or as your caregiver suggests.   You have a stiff neck.   You have loss of vision.   You have muscular weakness.   You have loss of muscular control.   You develop severe symptoms different from your first symptoms.   You start losing your balance or have trouble walking.   You feel faint or  pass out.  MAKE SURE YOU:   Understand these instructions.   Will watch your condition.   Will get help right away if you are not doing well or get worse.  Document Released: 08/22/2005 Document Revised: 08/11/2011 Document Reviewed: 04/10/2008 [facility] Patient Information 2012 [place], [place].

## 2012-01-01 NOTE — ED Provider Notes (Signed)
History     CSN: [id number]  Arrival date & time 01/01/12  1139   First MD Initiated Contact with Patient 01/01/12 1145      Chief Complaint  Patient presents with  . Headache  . Hypertension    (Consider location/radiation/quality/duration/timing/severity/associated sxs/prior treatment) HPI  Patient presents to emergency department by EMS complaining of hypertension and headache. I evaluated the patient in the emergency room yesterday when she presented for similar complaints of hypertension and associated headache. Patient states that after leaving the emergency department yesterday her headache had resolved. Patient states that when she woke up this morning she noticed gradual onset headache in the posterior aspect of her skull once again and became very anxious that her blood pressure could be elevating again. Patient states she has an anxiety disorder and admittingly states "I know I have a lot of anxiety about my blood pressure." Patient states that she took her blood pressure and noted it to be 220/100. Patient states she became concerned and therefore once again to the dose of hydrochlorothiazide that had been stopped by her primary care provider. Patient states that she's currently on propranolol 20 mg 3 times a day. Patient states she's taken her morning dose of propranolol as well as 12.5 mg dose of hydrochlorothiazide. Patient did not take anything for headache prior to arrival. Patient states that by the time EMS arrived her blood pressure had decreased to 180/100 and by the time of ER arrival systolic had decreased to 160. Patient states "this always happens with my blood pressure and my anxiety it is high at home but by the time EMS gets there or I get to the hospital it is back to normal." Patient denies any fevers, chills, visual changes, neck stiffness, rash, chest pain, shortness of breath, changes in her urination, difficulty in relating, slurred speech. Patient states she has a  mild dull ongoing headache. Patient is tearful and states "I just don't want to die of a heart attack or stroke because I know that blood pressure when it's not controlled can do that." [facility]. Patient states her hydrochlorothiazide-lisinopril [facility] because her blood pressure had improved at that time but she states "I think I need to be on it again."  Past Medical History  Diagnosis Date  . Anxiety   . Hypertension   . Bipolar disorder   . GERD (gastroesophageal reflux disease)   . Hiatal hernia     Past Surgical History  Procedure Date  . Cesarean section   . Knee arthroscopy     left  . Tubal ligation   . Endometrial ablation     Family History  Problem Relation Age of Onset  . Stomach cancer Paternal Grandfather   . Diabetes Maternal Grandfather   . Diabetes Paternal Grandmother   . Heart disease Father   . Lung cancer Father   . Heart disease Mother   . Depression Mother   . Colon polyps Brother   . Kidney disease Maternal Uncle   . Cirrhosis Cousin     alcoholic  . Anxiety disorder Maternal Aunt   . Depression Maternal Aunt     History  Substance Use Topics  . Smoking status: Current Everyday Smoker -- 1.0 packs/day    Types: Cigarettes  . Smokeless tobacco: Never Used   Comment: form given 10-27-11  . Alcohol Use: No    OB History  Grav Para Term Preterm Abortions TAB SAB Ect Mult Living                  Review of Systems  All other systems reviewed and are negative.    Allergies  Aspirin and Minocycline  Home Medications   Current Outpatient Rx  Name Route Sig Dispense Refill  . DICYCLOMINE HCL 10 MG PO CAPS Oral Take 1 capsule (10 mg total) by mouth 3 (three) times daily as needed. 90 capsule 3  . LISINOPRIL-HYDROCHLOROTHIAZIDE 20-25 MG PO TABS Oral Take 1 tablet by mouth daily.    Marland Kitchen LORAZEPAM 1 MG PO TABS Oral Take 1 tablet (1 mg total) by  mouth at bedtime. For anxiety/sleep 30 tablet 1  . ONE-DAILY MULTI VITAMINS PO TABS Oral Take 1 tablet by mouth daily.    Marland Kitchen OLANZAPINE 15 MG PO TABS Oral Take 1 tablet (15 mg total) by mouth daily as needed. 30 tablet 1  . OMEPRAZOLE 40 MG PO CPDR Oral Take 1 capsule (40 mg total) by mouth 2 (two) times daily. 60 capsule 6  . PROMETHAZINE HCL 25 MG PO TABS Oral Take 1 tablet (25 mg total) by mouth every 6 (six) hours as needed for nausea. 12 tablet 0  . PROPRANOLOL HCL 20 MG PO TABS Oral Take 20 mg by mouth 3 (three) times daily.     . TRAZODONE HCL 150 MG PO TABS Oral Take 75 mg by mouth at bedtime. She takes 1/2 tablet but if she wakes up and needs to she will take the other half.      BP 160/108  Pulse 75  Temp(Src) 98.4 F (36.9 C) (Oral)  Resp 16  SpO2 100%  Physical Exam  Nursing note and vitals reviewed. Constitutional: She is oriented to person, place, and time. She appears well-developed and well-nourished. No distress.  HENT:  Head: Normocephalic and atraumatic.  Eyes: Conjunctivae and EOM are normal. Pupils are equal, round, and reactive to light.  Neck: Normal range of motion. Neck supple.  Cardiovascular: Normal rate, regular rhythm, normal heart sounds and intact distal pulses.  Exam reveals no gallop and no friction rub.   No murmur heard. Pulmonary/Chest: Effort normal and breath sounds normal. No respiratory distress. She has no wheezes. She has no rales. She exhibits no tenderness.  Abdominal: Soft. Bowel sounds are normal. She exhibits no distension and no mass. There is no tenderness. There is no rebound and no guarding.  Musculoskeletal: Normal range of motion. She exhibits no edema and no tenderness.  Neurological: She is alert and oriented to person, place, and time. No cranial nerve deficit. Coordination normal.  Skin: Skin is warm and dry. No rash noted. She is not diaphoretic. No erythema.  Psychiatric: She has a normal mood and affect.    ED Course    Procedures (including critical care time)  Labs Reviewed  POCT I-STAT, CHEM 8 - Abnormal; Notable for the following:    Potassium 3.2 (*)    Hemoglobin 15.6 (*)    All other components within normal limits   Ct Head Wo Contrast  01/01/2012  *RADIOLOGY REPORT*  Clinical Data: Headache.  Hypertension.  CT HEAD WITHOUT CONTRAST  Technique:  Contiguous axial images were obtained from the base of the skull through the vertex without contrast.  Comparison: 10/03/2009.  Findings: The cerebral hemispheres and posterior fossa structures continue to have normal appearances.  Normal size and position of the ventricles.  No intracranial hemorrhage, mass lesion or CT  evidence of acute infarction.  Chronically opacified left ethmoid air cell with an interval second opacified left ethmoid air cell.  IMPRESSION:  1.  No intracranial abnormality. 2.  Mild chronic left ethmoid sinusitis.  Original Report Authenticated By: Darrol Angel, M.D.     1. Hypertension   2. Anxiety   3. Headache   4. Hypokalemia       MDM  A similar to yesterday patient's blood pressure has no evidence of hypertensive urgency or emergency and improved dramatically while in the ER as well as complete resolution of headache. Labs and CT of her head did not show any endorgan damage. She is ambulating without difficulty. She has no neuro focal findings. I do believe there is high component of anxiety linked with her hypertension and headaches. Spoke at length with patient about daily blood pressure monitoring and following up with her primary care provider for further evaluation and management. She does have a followup appointment with her primary care provider on May 6. She was concerned she again is agreeable to plan.        Jenness Corner, PA 01/01/12 1500

## 2012-01-02 ENCOUNTER — Telehealth (HOSPITAL_COMMUNITY): Payer: Self-pay | Admitting: *Deleted

## 2012-01-02 ENCOUNTER — Other Ambulatory Visit (HOSPITAL_COMMUNITY): Payer: Self-pay | Admitting: Psychiatry

## 2012-01-02 DIAGNOSIS — I10 Essential (primary) hypertension: Secondary | ICD-10-CM | POA: Diagnosis not present

## 2012-01-02 DIAGNOSIS — R69 Illness, unspecified: Secondary | ICD-10-CM | POA: Diagnosis not present

## 2012-01-02 DIAGNOSIS — F411 Generalized anxiety disorder: Secondary | ICD-10-CM | POA: Diagnosis not present

## 2012-01-02 DIAGNOSIS — F319 Bipolar disorder, unspecified: Secondary | ICD-10-CM | POA: Diagnosis not present

## 2012-01-02 NOTE — Telephone Encounter (Signed)
Sandra Miller at [facility] states patient's provider at [facility] needs to speak to Dr.Arfeen [phone]

## 2012-01-02 NOTE — Telephone Encounter (Signed)
Called from [facility] to discuss patient. Audria Nine is contact. [phone]

## 2012-01-02 NOTE — Telephone Encounter (Signed)
Call returned to [facility] and spoke to physician.  Patient was seen this [facility] at [facility] and found hypertensive.  Patient did not mention that she is taking Zyprexa, it is unclear if she is taking regularly.  Patient again scheduled to see physician for blood pressure monitor next week.  Discussed the case with her physician and recommended to inquire about compliance with Zyprexa.  If patient is not taking Zyprexa and blood pressure is very high than  contact our office for early appointment.

## 2012-01-06 NOTE — ED Provider Notes (Signed)
Medical screening examination/treatment/procedure(s) were conducted as a shared visit with non-physician practitioner(s) and myself.  I personally evaluated the patient during the encounter  Recurrent visit for HA and HTN, work up with out significant findings to include head CT. Okay for discharge and follow up.   Shelda Jakes, MD 01/06/12 [phone]

## 2012-01-09 ENCOUNTER — Other Ambulatory Visit: Payer: Self-pay

## 2012-01-09 ENCOUNTER — Other Ambulatory Visit (HOSPITAL_COMMUNITY)
Admission: RE | Admit: 2012-01-09 | Discharge: 2012-01-09 | Disposition: A | Discharge status: Home / Self Care | Payer: Medicare Other | Source: Ambulatory Visit | Attending: Family Medicine | Admitting: Family Medicine

## 2012-01-09 DIAGNOSIS — I10 Essential (primary) hypertension: Secondary | ICD-10-CM | POA: Diagnosis not present

## 2012-01-09 DIAGNOSIS — Z124 Encounter for screening for malignant neoplasm of cervix: Secondary | ICD-10-CM | POA: Diagnosis not present

## 2012-01-09 DIAGNOSIS — Z0000 Encounter for general adult medical examination without abnormal findings: Secondary | ICD-10-CM | POA: Diagnosis not present

## 2012-01-09 DIAGNOSIS — F319 Bipolar disorder, unspecified: Secondary | ICD-10-CM | POA: Diagnosis not present

## 2012-01-09 DIAGNOSIS — N760 Acute vaginitis: Secondary | ICD-10-CM | POA: Diagnosis not present

## 2012-01-17 ENCOUNTER — Encounter: Payer: Self-pay | Admitting: *Deleted

## 2012-01-18 ENCOUNTER — Ambulatory Visit (INDEPENDENT_AMBULATORY_CARE_PROVIDER_SITE_OTHER): Payer: Medicare Other | Admitting: Cardiovascular Disease

## 2012-01-18 ENCOUNTER — Encounter: Payer: Self-pay | Admitting: Cardiovascular Disease

## 2012-01-18 VITALS — BP 130/80 | HR 74 | Ht 65.0 in | Wt 180.0 lb

## 2012-01-18 DIAGNOSIS — F341 Dysthymic disorder: Secondary | ICD-10-CM | POA: Diagnosis not present

## 2012-01-18 DIAGNOSIS — IMO0002 Reserved for concepts with insufficient information to code with codable children: Secondary | ICD-10-CM

## 2012-01-18 NOTE — Progress Notes (Signed)
Patient ID: Sandra Miller, female   DOB: 1975-11-13, 36 y.o.   MRN: [id number] 36 yo history of HTN labile with anxiety.  She has seen recently in ER with elevated BP and headache.  She is compliant with her meds but has been on/off of Zyprexa.  No SSCP, dyspnea or palpitations.  Lots of stress.  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  Has had issue with alcohol and drug use in past.  Some caffeine Reviewed ECG form 1/13 and no LVH.  No other signs of end organ damgae.  BP fine in office today   ROS: Denies fever, malais, weight loss, blurry vision, decreased visual acuity, cough, sputum, SOB, hemoptysis, pleuritic pain, palpitaitons, heartburn, abdominal pain, melena, lower extremity edema, claudication, or rash.  All other systems reviewed and negative   General: Affect appropriate Healthy:  appears stated age HEENT: normal Neck supple with no adenopathy JVP normal no bruits no thyromegaly Lungs clear with no wheezing and good diaphragmatic motion Heart:  S1/S2 no murmur,rub, gallop or click PMI normal Abdomen: benighn, BS positve, no tenderness, no AAA no bruit.  No HSM or HJR Distal pulses intact with no bruits No edema Neuro non-focal Skin warm and dry No muscular weakness  Medications Current Outpatient Prescriptions  Medication Sig Dispense Refill  . acetaminophen (TYLENOL) 500 MG tablet Take 1,000 mg by mouth 2 (two) times daily as needed. For pain      . hydrochlorothiazide (HYDRODIURIL) 25 MG tablet Take 1 tablet (25 mg total) by mouth daily.  21 tablet  0  . hydrochlorothiazide (HYDRODIURIL) 25 MG tablet Take 25 mg by mouth daily.      Marland Kitchen LORazepam (ATIVAN) 1 MG tablet Take 1 mg by mouth daily as needed. For anxiety/sleep      . Multiple Vitamin (MULTIVITAMIN) tablet Take 1 tablet by mouth daily.      Marland Kitchen OLANZapine (ZYPREXA) 15 MG tablet Take 15 mg by mouth at bedtime.      Marland Kitchen omeprazole (PRILOSEC) 40 MG capsule Take 1  capsule (40 mg total) by mouth 2 (two) times daily.  60 capsule  6  . Polyvinyl Alcohol-Povidone (REFRESH OP) Apply 2 drops to eye 2 (two) times daily as needed. For dry eyes      . propranolol (INDERAL) 20 MG tablet Take 40 mg by mouth 3 (three) times daily.       . traZODone (DESYREL) 150 MG tablet She takes 1/2 tablet but if she wakes up and needs to she will take the other half.      Marland Kitchen VITAMIN E PO Take 3 capsules by mouth daily.      . promethazine (PHENERGAN) 25 MG tablet Take 1 tablet (25 mg total) by mouth every 6 (six) hours as needed for nausea.  12 tablet  0  . DISCONTD: hydrochlorothiazide (HYDRODIURIL) 12.5 MG tablet Take 12.5 mg by mouth daily.        Allergies Aspirin and Minocycline  Family History: Family History  Problem Relation Age of Onset  . Stomach cancer Paternal Grandfather   . Diabetes Maternal Grandfather   . Diabetes Paternal Grandmother   . Heart disease Father   . Lung cancer Father   . Heart disease Mother   . Depression Mother   . Colon polyps Brother   . Kidney disease Maternal Uncle   . Cirrhosis Cousin     alcoholic  . Anxiety disorder Maternal  Aunt   . Depression Maternal Aunt     Social History: History   Social History  . Marital Status: Single    Spouse Name: N/A    Number of Children: N/A  . Years of Education: N/A   Occupational History  . Not on file.   Social History Main Topics  . Smoking status: Current Everyday Smoker -- 1.0 packs/day    Types: Cigarettes  . Smokeless tobacco: Never Used   Comment: form given 10-27-11  . Alcohol Use: No  . Drug Use: No     did use marijuana, no longer doing this  . Sexually Active: Yes    Birth Control/ Protection: None   Other Topics Concern  . Not on file   Social History Narrative  . No narrative on file    Electrocardiogram: 09/23/11  NSR rate 83 normal no LVH  Assessment and Plan

## 2012-01-18 NOTE — Patient Instructions (Signed)
Your physician recommends that you schedule a follow-up appointment in: AS NEEDED  Your physician recommends that you continue on your current medications as directed. Please refer to the Current Medication list given to you today.  

## 2012-01-18 NOTE — Assessment & Plan Note (Signed)
Seeing psychologist at [facility] Friday.  I think lability in BP is related to this

## 2012-01-18 NOTE — Assessment & Plan Note (Signed)
Labile  Continue beta blocker and diuretic.  No signs of end organ damage.  F/U for anxiety

## 2012-01-20 ENCOUNTER — Ambulatory Visit (INDEPENDENT_AMBULATORY_CARE_PROVIDER_SITE_OTHER): Payer: Medicare Other | Admitting: Psychiatry

## 2012-01-20 ENCOUNTER — Encounter (HOSPITAL_COMMUNITY): Payer: Self-pay | Admitting: Psychiatry

## 2012-01-20 VITALS — BP 160/108 | Wt 181.0 lb

## 2012-01-20 DIAGNOSIS — F319 Bipolar disorder, unspecified: Secondary | ICD-10-CM

## 2012-01-20 MED ORDER — LORAZEPAM 1 MG PO TABS: 1.0000 mg | ORAL_TABLET | Freq: Every day | ORAL | Status: DC | PRN

## 2012-01-20 MED ORDER — OLANZAPINE 15 MG PO TABS: 15.0000 mg | ORAL_TABLET | Freq: Every day | ORAL | Status: DC

## 2012-01-20 MED ORDER — FLUOXETINE HCL 10 MG PO CAPS: 10.0000 mg | ORAL_CAPSULE | Freq: Every day | ORAL | Status: DC

## 2012-01-20 MED ORDER — TRAZODONE HCL 100 MG PO TABS: 100.0000 mg | ORAL_TABLET | Freq: Every day | ORAL | Status: DC

## 2012-01-20 NOTE — Progress Notes (Signed)
Chief complaint I am feeling anxious and depressed.  I still struggle to keep my blood pressure normal.  History of present illness Patient is 36 year old female who came for her followup appointment.  Patient endorse having difficulty to control her blood pressure.  She has been seeing few times to her primary care physician anyone visited emergency room for hypertension.  As per patient she has fluctuation on her blood pressure reading however she swears that she is taking the medication as prescribed.  She admitted lately she is under a lot of stress.  Her children as her moving out and she feel very scared to live by herself.  She also endorse that her job is very stressful.  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  She admitted being more anxious fearful and worries about the future.  She does not want to live by herself.  Her children recently gave her a puppy but she realizes that puppy cannot replace her children.  She reported that she is taking Zyprexa regularly .  She still take trazodone half tablet as full tablet makes her very groggy and sleepy.  She denies any active or passive suicidal thinking but endorse increased anxiety and stress.  She denies any agitation or anger .  She denies any paranoia .  Her vitals today shows that she has high blood pressure.  He  Current psychiatric medication Zyprexa 15 mg at bedtime Trazodone 150 mg, half to 1 tablet as needed Ativan 1 mg at bedtime  Past psychiatric history Patient has history of psychiatric inpatient treatment, she has a history of impulsive behavior agitation anger paranoia and psychotic episodes.  Medical history Patient has history of hypertension has been an issue in past few months. She had a history of GERD. She has recently seen her primary care physician many times to manage her blood pressure.  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  She denies any drinking or using drugs. He  Mental status examination Patient is casually dressed fairly groomed. She is anxious but cooperative and pleasant. Her speech is soft clear and coherent.  She described her mood is sad tearful and depressed and her affect is constricted.  Her attention and concentration is fair.  Her thought process logical linear and goal-directed. She has any active or passive suicidal thoughts or homicidal thoughts. There no psychotic symptoms present. She's alert and oriented x3. Her insight judgment and impulse control is okay.  Assessment Axis I Bipolar disorder Axis II deferred Axis III see medical history Axis IV moderate  Plan I review her medication, psychosocial stressor past history.  Patient remembered taking Prozac in the past which did help her anxiety.  I will add Prozac 10 mg to target her anxiety symptoms.  I will also increase her trazodone to take 100 mg at bedtime for insomnia.  She is seeing therapist in this office for coping and social skills.  I recommend to call us if she feels worsening of the symptoms or having any suicidal thoughts or homicidal thoughts.  I recommend to contact her primary care physician due to her high blood pressure and she agreed.  I will see her again in 3 weeks.  Time spent 30 minutes.

## 2012-01-23 DIAGNOSIS — I10 Essential (primary) hypertension: Secondary | ICD-10-CM | POA: Diagnosis not present

## 2012-01-23 DIAGNOSIS — E559 Vitamin D deficiency, unspecified: Secondary | ICD-10-CM | POA: Diagnosis not present

## 2012-01-27 ENCOUNTER — Ambulatory Visit (HOSPITAL_COMMUNITY): Payer: Self-pay | Admitting: Psychology

## 2012-02-10 ENCOUNTER — Encounter (HOSPITAL_COMMUNITY): Payer: Self-pay | Admitting: Psychiatry

## 2012-02-10 ENCOUNTER — Ambulatory Visit (INDEPENDENT_AMBULATORY_CARE_PROVIDER_SITE_OTHER): Payer: Medicare Other | Admitting: Psychiatry

## 2012-02-10 VITALS — BP 145/100 | Wt 180.0 lb

## 2012-02-10 DIAGNOSIS — F319 Bipolar disorder, unspecified: Secondary | ICD-10-CM

## 2012-02-10 MED ORDER — FLUOXETINE HCL 10 MG PO CAPS: 10.0000 mg | ORAL_CAPSULE | Freq: Every day | ORAL | Status: DC

## 2012-02-10 MED ORDER — OLANZAPINE 15 MG PO TABS: 15.0000 mg | ORAL_TABLET | Freq: Every day | ORAL | Status: DC

## 2012-02-10 MED ORDER — LORAZEPAM 1 MG PO TABS: 1.0000 mg | ORAL_TABLET | Freq: Every day | ORAL | Status: DC | PRN

## 2012-02-10 MED ORDER — TRAZODONE HCL 100 MG PO TABS: 100.0000 mg | ORAL_TABLET | Freq: Every day | ORAL | Status: DC

## 2012-02-10 NOTE — Progress Notes (Signed)
Chief complaint I am feeling better.  I like Prozac.  History of present illness Patient is 36 year old female who came for her followup appointment.  She was started on  Prozac 10 mg on her last visit.initially she was very scared to take the medication however once she started she is feeling better.  She is less anxious and less depressed.  Her blood pressure is also slowly getting better.  She denies any panic attack or crying spells.  She is trying to keep herself busy.  She started regular exercise and keeping herself busy on the computer.  She is able to lose one more pound from his last visit she has not noticed that many fluctuation on her blood pressure.  She likes her job and now she is handling better her clients.  Overall she is improved from the past.  She likes trazodone which was increased on her last visit.  She sleeps better.  She denies any agitation anger mood swing.  She denies any active or passive suicidal thoughts.  There were no recent hallucination or paranoid thinking.  Current psychiatric medication Zyprexa 15 mg at bedtime Trazodone 100 mg at bedtime  Prozac 10 mg daily Ativan 1 mg at bedtime  Past psychiatric history Patient has history of psychiatric inpatient treatment, she has a history of impulsive behavior agitation anger paranoia and psychotic episodes.  Medical history Patient has history of hypertension has been an issue in past few months. She had a history of GERD. She has recently seen her primary care physician many times to manage her blood pressure.  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 She denies any drinking or using drugs. He  Mental status examination Patient is casually dressed fairly groomed. She is calm and cooperative.  She maintained good eye contact.  Her speech is soft fluent and clear.  Her thought process is also logical linear and goal-directed.  She described  her mood is good and her affect is mood congruent.  She denies any flight of idea or loose association.  Her attention and concentration is improved from the past.  She denies any auditory or visual hallucination.  She denies any  Active or passive she's alert and oriented x3.  Her insight judgment and impulse control is okay.  Assessment Axis I Bipolar disorder Axis II deferred Axis III see medical history Axis IV moderate  Plan I review her vitals,  medication and response to the medication.  Patient is doing much improvement in her anxiety.  Her blood pressure has been much better .  I will continue her current medication.  I recommend to call us if she is a question or concern about the medication or if he feel worsening of the symptoms.  Time spent 30 minutes.  I will see her again in 6 weeks.  Portion of this note is generated with voice recognition software and may contain typographical error.

## 2012-03-15 DIAGNOSIS — E669 Obesity, unspecified: Secondary | ICD-10-CM | POA: Diagnosis not present

## 2012-03-15 DIAGNOSIS — N760 Acute vaginitis: Secondary | ICD-10-CM | POA: Diagnosis not present

## 2012-03-15 DIAGNOSIS — I10 Essential (primary) hypertension: Secondary | ICD-10-CM | POA: Diagnosis not present

## 2012-03-23 ENCOUNTER — Ambulatory Visit (INDEPENDENT_AMBULATORY_CARE_PROVIDER_SITE_OTHER): Payer: Medicare Other | Admitting: Psychiatry

## 2012-03-23 ENCOUNTER — Encounter (HOSPITAL_COMMUNITY): Payer: Self-pay | Admitting: Psychiatry

## 2012-03-23 DIAGNOSIS — F319 Bipolar disorder, unspecified: Secondary | ICD-10-CM

## 2012-03-23 MED ORDER — TRAZODONE HCL 100 MG PO TABS: 100.0000 mg | ORAL_TABLET | Freq: Every day | ORAL | Status: DC

## 2012-03-23 MED ORDER — OLANZAPINE 15 MG PO TABS: 15.0000 mg | ORAL_TABLET | Freq: Every day | ORAL | Status: DC

## 2012-03-23 MED ORDER — FLUOXETINE HCL 10 MG PO CAPS: 10.0000 mg | ORAL_CAPSULE | Freq: Every day | ORAL | Status: DC

## 2012-03-23 NOTE — Progress Notes (Signed)
Chief complaint Medication management and followup.  History of present illness Patient is 36 year old female who came for her followup appointment.  She likes her Prozac which she has been taking regularly.  She endorse some anxiety as she stopped taking Inderal because her blood pressure has been low.  In the past she has issue with her blood pressure.  She has a high blood pressure and when she tried to antihypertensive her blood pressure became very low.  Recently she is taking hydrochlorothiazide only half tablet due to low blood pressure.  She also endorse headache and admitted taking Tylenol 3 times a day with some relief.  Overall her mood has been better.  She denies any recent agitation anger mood swing.  She denies any insomnia or any paranoid thinking.  There are sometimes when she feel very anxious but denies any recent panic attack.  She's not crying or having any severe mood swings in past few weeks.  She reported no side effects of medication.  She's not drinking or using any illegal substance.  She likes to continue her current psychiatric medication.  Current psychiatric medication Zyprexa 15 mg at bedtime Trazodone 100 mg at bedtime  Prozac 10 mg daily Ativan 1 mg at bedtime  Past psychiatric history Patient has history of psychiatric inpatient treatment, she has a history of impulsive behavior agitation anger paranoia and psychotic episodes.  Medical history Patient has history of hypertension which has been an issue in past few months. She had a history of GERD. She has recently seen her primary care physician many times to manage her blood pressure.  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 She denies any drinking or using drugs. He  Mental status examination Patient is casually dressed fairly groomed. She is tired but calm and cooperative.  She maintained good eye contact.  Her speech is soft  fluent and clear.  Her thought process is also logical linear and goal-directed.  She described her mood is good and her affect is mood congruent.  She denies any flight of idea or loose association.  Her attention and concentration is improved from the past.  She denies any auditory or visual hallucination.  She denies any  Active or passive she's alert and oriented x3.  Her insight judgment and impulse control is okay.  Assessment Axis I Bipolar disorder Axis II deferred Axis III see medical history Axis IV moderate  Plan I recommend to continue her current psychiatric medication.  I also recommend to contact her primary care physician if she continues to have headache .  Patient is taking Tylenol 2-3 times a day and I recommend that she should see her primary care physician if her headache does not resolve the town all.  I also reminded that moderate intake of Tylenol may cause legal issues.  Patient acknowledged and will contact primary care physician for headache does not resolve.  I also explained that call us if she has any question or concern about the medication or if she feels worsening of the symptoms.  I will see her again in 2 months.  At this time patient has one remaining refill on Ativan and she will not require a new prescription.  We will provide all other prescription with one refill.  Portion of this note is generated with voice dictation software and may contain typographical error.

## 2012-05-08 ENCOUNTER — Other Ambulatory Visit (HOSPITAL_COMMUNITY): Payer: Self-pay | Admitting: Psychiatry

## 2012-05-08 DIAGNOSIS — F319 Bipolar disorder, unspecified: Secondary | ICD-10-CM

## 2012-05-28 ENCOUNTER — Ambulatory Visit (HOSPITAL_COMMUNITY): Payer: Self-pay | Admitting: Psychiatry

## 2012-05-30 ENCOUNTER — Encounter (HOSPITAL_COMMUNITY): Payer: Self-pay

## 2012-05-30 ENCOUNTER — Emergency Department (INDEPENDENT_AMBULATORY_CARE_PROVIDER_SITE_OTHER)
Admission: EM | Admit: 2012-05-30 | Discharge: 2012-05-30 | Disposition: A | Discharge status: Home / Self Care | Payer: Medicare Other | Source: Home / Self Care | Attending: Emergency Medicine | Admitting: Emergency Medicine

## 2012-05-30 DIAGNOSIS — L03211 Cellulitis of face: Secondary | ICD-10-CM

## 2012-05-30 DIAGNOSIS — L0201 Cutaneous abscess of face: Secondary | ICD-10-CM | POA: Diagnosis not present

## 2012-05-30 MED ORDER — SULFAMETHOXAZOLE-TRIMETHOPRIM 800-160 MG PO TABS: 1.0000 | ORAL_TABLET | Freq: Two times a day (BID) | ORAL | Status: AC

## 2012-05-30 MED ORDER — MUPIROCIN 2 % EX OINT: TOPICAL_OINTMENT | Freq: Three times a day (TID) | CUTANEOUS | Status: DC

## 2012-05-30 NOTE — ED Provider Notes (Addendum)
History     CSN: [id number]  Arrival date & time 05/30/12  1407   First MD Initiated Contact with Patient 05/30/12 1454      Chief Complaint  Patient presents with  . Facial Swelling    (Consider location/radiation/quality/duration/timing/severity/associated sxs/prior treatment) HPI Comments: Sandra Miller presents to the urgent care this afternoon complaining of a new lead develop bump on the left side of her face that is becoming tender, bigger swollen redder. Patient describes that she has had many of this infections before and she used to see a dermatologist for this but as her clinic closed, she no longer can see him. She has been taking some amoxicillin that she had left over so she restarted as soon as she saw this new infection. But doesn't seem to be working. "As I feel now and needs to be cut",...  The history is provided by the patient.    Past Medical History  Diagnosis Date  . Anxiety   . Hypertension   . Bipolar disorder   . GERD (gastroesophageal reflux disease)   . Hiatal hernia   . HSV   . DISORDER, BIPOLAR NOS   . ALCOHOLISM   . DISORDER, TOBACCO USE   . SUBSTANCE ABUSE   . CONSTIPATION   . Abdominal pain, unspecified site   . EPIGASTRIC PAIN   . HELICOBACTER PYLORI GASTRITIS, HX OF   . NEPHROLITHIASIS, HX OF   . HX, PERSONAL, GENITAL/OBSTETRIC DISORDER NEC   . Esophageal dysphagia     Past Surgical History  Procedure Date  . Cesarean section   . Knee arthroscopy     left  . Tubal ligation   . Endometrial ablation     Family History  Problem Relation Age of Onset  . Stomach cancer Paternal Grandfather   . Diabetes Maternal Grandfather   . Diabetes Paternal Grandmother   . Heart disease Father   . Lung cancer Father   . Heart disease Mother   . Depression Mother   . Colon polyps Brother   . Kidney disease Maternal Uncle   . Cirrhosis Cousin     alcoholic  . Anxiety disorder Maternal Aunt   . Depression Maternal Aunt     History  Substance  Use Topics  . Smoking status: Current Every Day Smoker -- 1.0 packs/day    Types: Cigarettes  . Smokeless tobacco: Never Used   Comment: form given 10-27-11  . Alcohol Use: No    OB History    Grav Para Term Preterm Abortions TAB SAB Ect Mult Living                  Review of Systems  Constitutional: Negative for fever, chills and activity change.  Skin: Negative for color change and rash.    Allergies  Aspirin and Minocycline  Home Medications   Current Outpatient Rx  Name Route Sig Dispense Refill  . ACETAMINOPHEN 500 MG PO TABS Oral Take 1,000 mg by mouth 2 (two) times daily as needed. For pain    . FLUOXETINE HCL 10 MG PO CAPS Oral Take 1 capsule (10 mg total) by mouth daily. 30 capsule 1  . ONE-DAILY MULTI VITAMINS PO TABS Oral Take 1 tablet by mouth daily.    Marland Kitchen OLANZAPINE 15 MG PO TABS Oral Take 1 tablet (15 mg total) by mouth at bedtime. 30 tablet 1  . OMEPRAZOLE 40 MG PO CPDR Oral Take 1 capsule (40 mg total) by mouth 2 (two) times daily. 60 capsule 6  .  VITAMIN E PO Oral Take 3 capsules by mouth daily.    Marland Kitchen HYDROCHLOROTHIAZIDE 25 MG PO TABS Oral Take 1 tablet (25 mg total) by mouth daily. 21 tablet 0  . LORAZEPAM 1 MG PO TABS  TAKE 1 TABLET BY MOUTH DAILY AS NEEDED FOR SLEEP/ANIEXTY 30 tablet 1  . MUPIROCIN 2 % EX OINT Topical Apply topically 3 (three) times daily. APPLY BID X 5 DAYS 22 g 0  . REFRESH OP Ophthalmic Apply 2 drops to eye 2 (two) times daily as needed. For dry eyes    . PROMETHAZINE HCL 25 MG PO TABS Oral Take 1 tablet (25 mg total) by mouth every 6 (six) hours as needed for nausea. 12 tablet 0  . SULFAMETHOXAZOLE-TRIMETHOPRIM 800-160 MG PO TABS Oral Take 1 tablet by mouth 2 (two) times daily. 14 tablet 0  . TRAZODONE HCL 100 MG PO TABS Oral Take 1 tablet (100 mg total) by mouth at bedtime. 30 tablet 1    BP 145/87  Pulse 84  Temp 99 F (37.2 C) (Oral)  Resp 16  SpO2 99%  Physical Exam  Nursing note and vitals reviewed. Constitutional: She  appears well-developed and well-nourished.  HENT:  Head:    Skin: Skin is warm. There is erythema.    ED Course  INCISION AND DRAINAGE Date/Time: 05/30/2012 6:37 PM Performed by: Aurea Aronov Authorized by: Jimmie Molly Consent: Verbal consent obtained. Consent given by: patient Patient understanding: patient does not state understanding of the procedure being performed Patient consent: the patient's understanding of the procedure does not match consent given Procedure consent: procedure consent does not match procedure scheduled Relevant documents: relevant documents not present or verified Test results: test results not available Required items: required blood products, implants, devices, and special equipment available Time out: Immediately prior to procedure a "time out" was called to verify the correct patient, procedure, equipment, support staff and site/side marked as required. Type: abscess Body area: head/neck Location details: face Scalpel size: 11 Complexity: simple Drainage: purulent Drainage amount: moderate   (including critical care time)  Labs Reviewed - No data to display No results found.   1. Facial abscess       MDM  Patient with a facial abscess.I+d performed. Patient was encouraged to use a topical antibiotic for the first 2 days and if no improvement to commit with a Septra antibiotic course for 7 days. Patient agree understood treatment plan and followup care as necessary.       Jimmie Molly, MD 05/30/12 1839  Jimmie Molly, MD 05/30/12 [phone]

## 2012-05-30 NOTE — ED Notes (Signed)
Hist of repeated boils, until recently under care of dermatologist ([facility] patient) restarted amoxicillin BID as previously directed when problem started 4 days ago, but has not improved

## 2012-06-07 DIAGNOSIS — Z23 Encounter for immunization: Secondary | ICD-10-CM | POA: Diagnosis not present

## 2012-06-15 ENCOUNTER — Encounter (HOSPITAL_COMMUNITY): Payer: Self-pay | Admitting: Psychiatry

## 2012-06-15 ENCOUNTER — Ambulatory Visit (INDEPENDENT_AMBULATORY_CARE_PROVIDER_SITE_OTHER): Payer: Medicare Other | Admitting: Psychiatry

## 2012-06-15 VITALS — BP 124/85 | HR 88 | Wt 176.0 lb

## 2012-06-15 DIAGNOSIS — F319 Bipolar disorder, unspecified: Secondary | ICD-10-CM

## 2012-06-15 MED ORDER — OLANZAPINE 15 MG PO TABS: 15.0000 mg | ORAL_TABLET | Freq: Every day | ORAL | Status: DC

## 2012-06-15 MED ORDER — LORAZEPAM 1 MG PO TABS: 1.0000 mg | ORAL_TABLET | Freq: Every day | ORAL | Status: DC

## 2012-06-15 MED ORDER — TRAZODONE HCL 100 MG PO TABS: 100.0000 mg | ORAL_TABLET | Freq: Every day | ORAL | Status: DC

## 2012-06-15 MED ORDER — FLUOXETINE HCL 10 MG PO CAPS: 10.0000 mg | ORAL_CAPSULE | Freq: Every day | ORAL | Status: DC

## 2012-06-15 NOTE — Progress Notes (Signed)
Chief complaint Medication management and followup.  History of present illness Patient came for her followup appointment.  She's compliant with her psychiatric medication.  She has noticed much improvement in her mood anger sleep and also in her blood pressure.  She likes Prozac.  She stop taking antihypertensive medication as she believed it was due to stress and anxiety.  Since taking Prozac her anxiety and depression is much improved.  She is not taking blood pressure medication the past 30 days and have not noticed any headache .  She's been checking her blood pressure every day which is normal.  She's also watching her calorie intake .  She is trying to lose weight and has lost 5-6 pounds since her last visit.  She also start school and now trying to get medical associate degree.  She is very happy that her son is graduating in December.  Her daughter already graduated and she wants to go [employer] .  She is very proud on her children.  She denies any recent panic attack agitation anger mood swing.  She's not drinking or using any illegal substance.  She is thinking to downsize her place.  Since her daughter is going to move out, she is thinking two-bedroom apartment.  Overall she feel her psychiatric medication R. doing very well.  She believe she is best on her medication.  She denies any side effects of medication.  She denies any tremors shakes or any crying spells.  She denies any feeling of hopelessness or helplessness.  She's worried about her medical needs since [facility] is closed.  She sees urgent care when needed.  Current psychiatric medication Zyprexa 15 mg at bedtime Trazodone 100 mg at bedtime  Prozac 10 mg daily Ativan 1 mg at bedtime  Past psychiatric history Patient has history of psychiatric inpatient treatment, she has a history of impulsive behavior agitation anger paranoia and psychotic episodes.  Medical history Patient has history of hypertension which has been  an issue in past few months.  Now she is off from antihypertensive medication.  She's been checking her blood pressure every day which is normal.  She had a history of GERD.  She sees urgent care when needed.    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  She is thinking to downsize her place.  She lives with her son.    Mental status examination Patient is casually dressed fairly groomed.  She is pleasant calm and cooperative.  She maintained good eye contact.  Her speech is soft clear and coherent.  Her thought process is logical linear and goal-directed.  She described her mood is good and her affect is bright.  There were no tremors or shakes present.  She denies any auditory or visual hallucination.  She denies any active or passive suicidal thoughts or homicidal thoughts.  There were no psychotic symptoms present at this time.  She's alert and oriented x3.  She has good fund of knowledge.  Her attention and concentration is good.  Her insight judgment and impulse control is okay.  Assessment Axis I Bipolar disorder Axis II deferred Axis III see medical history Axis IV moderate  Plan I review her psychosocial, vitals, current medication list and response to the medication.  I also update her history.  I do believe since started Prozac she is doing much better.  Her anxiety level is reduced.  She's walking able  to lose weight.  She start school and continue to work.  I will continue her current psychiatric medication however reinforce keep checking her blood pressure every day since patient has significant history of hypertension the past.  I recommend to call us if she is any question or concern about the medication if she feels worsening of the symptom.  I explained risks and benefits of medication especially benzodiazepine causes tolerance withdrawal and dependency.  More than 50% of the time spent in counseling,  psychoeducation and coordination of care.  I will see her again in 2 months.  Portion of this note is generated with voice dictation software and may contain typographical error.

## 2012-06-21 ENCOUNTER — Other Ambulatory Visit: Payer: Self-pay | Admitting: Gastroenterology

## 2012-08-08 ENCOUNTER — Ambulatory Visit (INDEPENDENT_AMBULATORY_CARE_PROVIDER_SITE_OTHER): Payer: Medicare Other | Admitting: Psychiatry

## 2012-08-08 ENCOUNTER — Encounter (HOSPITAL_COMMUNITY): Payer: Self-pay | Admitting: Psychiatry

## 2012-08-08 VITALS — BP 137/88 | HR 89 | Wt 172.8 lb

## 2012-08-08 DIAGNOSIS — F319 Bipolar disorder, unspecified: Secondary | ICD-10-CM | POA: Diagnosis not present

## 2012-08-08 MED ORDER — TRAZODONE HCL 100 MG PO TABS: 100.0000 mg | ORAL_TABLET | Freq: Every day | ORAL | Status: DC

## 2012-08-08 MED ORDER — FLUOXETINE HCL 20 MG PO CAPS: 20.0000 mg | ORAL_CAPSULE | Freq: Every day | ORAL | Status: DC

## 2012-08-08 MED ORDER — OLANZAPINE 15 MG PO TABS: 15.0000 mg | ORAL_TABLET | Freq: Every day | ORAL | Status: DC

## 2012-08-08 NOTE — Progress Notes (Signed)
Patient ID: Sandra Miller, female   DOB: [birth year], 36 y.o.   MRN: [id number] Chief complaint I am feeling more depressed.  History of present illness Patient came for her followup appointment.  She noticed increased depression and anxiety symptoms in past few weeks.  On her last visit we had cut down trazodone from 1 5200 mg .  She complained insomnia and lately more isolated withdrawn decreased energy.  She do not know why she is more depressed than usual .  She finished her school and her job is working very well.  Her son graduate earlier.  She's very happy about her children.  She had a good Thanksgiving.  However she feels very isolated withdrawn tearful and sometime hopeless.  She like Prozac.  She denies any side effects of medication.  She's not drinking or using any illegal substance.  She denies any active or passive suicidal thoughts.  She is not taking any medication for her blood pressure since her blood pressure has been stable.  Current psychiatric medication Zyprexa 15 mg at bedtime Trazodone 100 mg at bedtime  Prozac 10 mg daily Ativan 1 mg at bedtime  Past psychiatric history Patient has history of psychiatric inpatient treatment, she has a history of impulsive behavior agitation anger paranoia and psychotic episodes.  Medical history Patient has history of hypertension which has been an issue in past few months.  Now she is off from antihypertensive medication.  She's been checking her blood pressure every day which is normal.  She had a history of GERD.  She scheduled to see for her and we'll physical checkup on December 18.      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  She is thinking to downsize her place.  She lives with her son.    Review of Systems  Constitutional: Negative.   Eyes: Negative.   Cardiovascular: Positive for palpitations. Negative for chest pain.  Gastrointestinal: Positive for  heartburn and constipation.  Musculoskeletal: Negative for joint pain and falls.  Skin: Negative.   Neurological: Positive for headaches. Negative for dizziness, tingling, seizures and loss of consciousness.  Psychiatric/Behavioral: Positive for depression. Negative for suicidal ideas, hallucinations and substance abuse. The patient is nervous/anxious and has insomnia.    Patient Active Problem List  Diagnosis  . HSV  . DISORDER, BIPOLAR NOS  . DISORDER, TOBACCO USE  . HIATAL HERNIA WITH REFLUX  . HYPERTENSION NEC  . HELICOBACTER PYLORI GASTRITIS, HX OF  . NEPHROLITHIASIS, HX OF  . HX, PERSONAL, GENITAL/OBSTETRIC DISORDER NEC  . Esophageal dysphagia   Mental status examination Patient is casually dressed fairly groomed.  She is anxious and depressed.  Her speech is slow but clear and coherent.  Her thought process is also slow but logical linear and goal-directed.  She described her mood is sad and depressed and her affect is constricted.  She denies any active or passive suicidal thoughts or homicidal thoughts.  She denies any auditory or visual hallucination.  Her attention and concentration is fair.  There were no delusion or paranoid thinking.  Her fund of knowledge is adequate.  There were no flight of idea or loose association.  There were no tremors or shakes present.  She's alert and oriented x3.  Her insight judgment and impulse control is okay.  Assessment Axis I Bipolar disorder Axis II deferred Axis III see medical history Axis IV moderate  Plan I review  her psychosocial, vitals, current medication list and response to the medication.  I do believe patient is been more depressed than usual.  I recommend to try Prozac 20 mg along with trazodone and Zyprexa.  I also recommend to see therapist for coping and social skills.  Patient in the past was seeing therapist however she stopped coming at she was feeling better.  I believe therapy will help her .  We'll schedule appointment  with Belenda Cruise for individual counseling.  I recommend to call us if she is any question or concern about the medication if she feels worsening of the symptoms.  I will see him again in 2 months.  Safety plan discussed that anytime having active suicidal thoughts or homicidal thoughts and he to call 911 or go to local emergency room.  Time spent 30 minutes. More than 50% of the time spent in psychoeducation, counseling and correlation okay.  Portion of this note is generated with voice dictation software and may contain typographical error.

## 2012-08-09 ENCOUNTER — Ambulatory Visit (HOSPITAL_COMMUNITY): Payer: Self-pay | Admitting: Psychiatry

## 2012-08-13 ENCOUNTER — Telehealth (HOSPITAL_COMMUNITY): Payer: Self-pay | Admitting: *Deleted

## 2012-08-13 NOTE — Telephone Encounter (Signed)
Contacted pt at Dr.Arfeen's request. Continue taking 10 mg Prozac.Pt verbalized understanding

## 2012-08-13 NOTE — Telephone Encounter (Signed)
See phone note

## 2012-08-13 NOTE — Telephone Encounter (Signed)
Message copied by Tonny Bollman on Mon Aug 13, 2012  4:56 PM ------      Message from: Kathryne Sharper T      Created: Mon Aug 13, 2012  4:15 PM       She can take 10 mg prozac.

## 2012-08-13 NOTE — Telephone Encounter (Signed)
Since increasing Prozac to 20 mg last week, has been nauseated and very fatigued.Thaought it would go away, but it has not. Today, took Prozac 10 mg like before. Tolerated lower dose. Patient question is:  Can she take Prozac 20 split into 2-10 mg doses, one in AM and one in PM? If this is not a good idea, she will go back to Prozac 10 mg daily as she cannot tolerate the 20 mg dose.

## 2012-08-15 ENCOUNTER — Other Ambulatory Visit (HOSPITAL_COMMUNITY): Payer: Self-pay | Admitting: Psychiatry

## 2012-08-22 ENCOUNTER — Encounter: Payer: Self-pay | Admitting: Family Medicine

## 2012-08-22 ENCOUNTER — Ambulatory Visit (INDEPENDENT_AMBULATORY_CARE_PROVIDER_SITE_OTHER): Payer: Medicare Other | Admitting: Family Medicine

## 2012-08-22 VITALS — BP 125/84 | HR 87 | Ht 65.5 in | Wt 173.5 lb

## 2012-08-22 DIAGNOSIS — B3731 Acute candidiasis of vulva and vagina: Secondary | ICD-10-CM

## 2012-08-22 DIAGNOSIS — Z720 Tobacco use: Secondary | ICD-10-CM | POA: Insufficient documentation

## 2012-08-22 DIAGNOSIS — IMO0002 Reserved for concepts with insufficient information to code with codable children: Secondary | ICD-10-CM

## 2012-08-22 DIAGNOSIS — F17200 Nicotine dependence, unspecified, uncomplicated: Secondary | ICD-10-CM

## 2012-08-22 DIAGNOSIS — B373 Candidiasis of vulva and vagina: Secondary | ICD-10-CM

## 2012-08-22 MED ORDER — FLUCONAZOLE 150 MG PO TABS: 150.0000 mg | ORAL_TABLET | Freq: Once | ORAL | Status: DC

## 2012-08-22 MED ORDER — NICOTINE 14 MG/24HR TD PT24: 1.0000 | MEDICATED_PATCH | TRANSDERMAL | Status: DC

## 2012-08-22 NOTE — Patient Instructions (Addendum)
It was nice to meet you today.  Try calling [facility] for your records again.  I am sending in a prescription for your yeast infection- take 1 now and take 1 in 3 days if you're still having symptoms.  It's great that you want to quit smoking! We agreed that your quit date is September 05, 2012. I have sent in some patches to help. --> If you cannot afford the patches, sometimes the quit line offers FREE patches! I always recommend the 1-800-QUIT-NOW line for extra support!  Come back to see me the beginning of January.  If we need extra help, our Pharmacist, Dr. Raymondo Band, is great with smoking cessation!

## 2012-08-22 NOTE — Assessment & Plan Note (Signed)
Pelvic exam deferred, diflucan Rx'ed.  If continues to be an issue will do wet prep.

## 2012-08-22 NOTE — Assessment & Plan Note (Signed)
Pt reports she is ready to quit. Set quit date for 09/05/12.  Nicoderm patches Rx'ed. Encouraged 1-800- quit line for support.  Will f/u beginning of next month.  Consider pharm clinic if pt is struggling.

## 2012-08-22 NOTE — Progress Notes (Signed)
S: Pt comes in today for NP visit.  Pt was former [facility] patient.  Other issues she would like to discuss include: -HTN- has history of HTN, but has been better since on Prozac, stopped BP meds when [facility] closed because she felt like it was getting low (was on HCTZ and propanolol, also h/o lisinopril but caused nausea)   -Vaginal yeast infection-- thick, white d/c, vaginal itching and skin irritation; has h/o frequent yeast and infections and that's what this feels like; also reports monthly BV right before her period starts- used to get metrogel with refills from previous MD -H/o boils all over-- usually sees Derm, Dr. Terri Piedra, but needs new referral per their office  -L arm numbness-- pt reports degenerative joint disease in her neck and OA in her shoulder; previously went to PT which helped this  Of note, patient is followed by Dr. Lolly Mustache for bipolar d/o, last seen 08/08/12, current meds include: Zyprexa 15 qhs, Trazodone 100 qhs, Prozac 10 qd (was increased to 20mg  12/4 but pt did not tolerate so decreased back to 10mg ), ativan 1mg  qhs; is on disability  Has fear of side effects of medicines   H/o EtOH abuse, hasn't drank in 1 year; h/o THC use, hasn't smoked in 1 year Still smokes cigarettes, 1 ppd, is interested in quitting.  Is ready to quit now, just having a hard time when she sees cigarettes.  Dad died of lung cancer.     ROS: Per HPI  History  Smoking status  . Current Every Day Smoker -- 1.0 packs/day  . Types: Cigarettes  Smokeless tobacco  . Never Used    Comment: form given 10-27-11    O:  Filed Vitals:   08/22/12 1014  BP: 125/84  Pulse: 87    Gen: NAD CV: RRR, no murmur Pulm: CTA bilat, no wheezes or crackles Ext: Warm,  no edema   A/P: 36 y.o. female p/w NP, mental health d/o, multiple other complaints including h/o HTN, tobacco use, and current yeast infection -See problem list -f/u in 3 weeks

## 2012-08-22 NOTE — Assessment & Plan Note (Signed)
Pt reports BP is well controlled with prozac.  Has been off all antiHTN meds since HS closed. Normotensive today, so no need to re-initiate therapy at this time.  Was previously on HCTZ and propranolol per pt.  Lisinopril intolerance (nausea)

## 2012-08-30 ENCOUNTER — Other Ambulatory Visit (HOSPITAL_COMMUNITY): Payer: Self-pay | Admitting: Psychiatry

## 2012-09-03 ENCOUNTER — Other Ambulatory Visit (HOSPITAL_COMMUNITY): Payer: Self-pay | Admitting: Psychiatry

## 2012-09-03 ENCOUNTER — Ambulatory Visit: Payer: Self-pay | Admitting: Obstetrics & Gynecology

## 2012-09-03 MED ORDER — FLUOXETINE HCL 10 MG PO CAPS: 10.0000 mg | ORAL_CAPSULE | Freq: Every day | ORAL | Status: DC

## 2012-09-04 ENCOUNTER — Other Ambulatory Visit (HOSPITAL_COMMUNITY): Payer: Self-pay | Admitting: Psychiatry

## 2012-09-12 ENCOUNTER — Ambulatory Visit (INDEPENDENT_AMBULATORY_CARE_PROVIDER_SITE_OTHER): Payer: Medicare Other | Admitting: Licensed Clinical Social Worker

## 2012-09-12 ENCOUNTER — Encounter (HOSPITAL_COMMUNITY): Payer: Self-pay | Admitting: Licensed Clinical Social Worker

## 2012-09-12 DIAGNOSIS — F319 Bipolar disorder, unspecified: Secondary | ICD-10-CM
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
## 2012-09-14 ENCOUNTER — Encounter: Payer: Self-pay | Admitting: Family Medicine

## 2012-09-14 ENCOUNTER — Ambulatory Visit (INDEPENDENT_AMBULATORY_CARE_PROVIDER_SITE_OTHER): Payer: Medicare Other | Admitting: Family Medicine

## 2012-09-14 VITALS — BP 129/80 | HR 85 | Ht 65.5 in | Wt 172.2 lb

## 2012-09-14 DIAGNOSIS — L0292 Furuncle, unspecified: Secondary | ICD-10-CM | POA: Diagnosis not present

## 2012-09-14 DIAGNOSIS — F17200 Nicotine dependence, unspecified, uncomplicated: Secondary | ICD-10-CM | POA: Diagnosis not present

## 2012-09-14 DIAGNOSIS — L732 Hidradenitis suppurativa: Secondary | ICD-10-CM | POA: Insufficient documentation

## 2012-09-14 DIAGNOSIS — IMO0002 Reserved for concepts with insufficient information to code with codable children: Secondary | ICD-10-CM

## 2012-09-14 DIAGNOSIS — Z720 Tobacco use: Secondary | ICD-10-CM

## 2012-09-14 DIAGNOSIS — L0293 Carbuncle, unspecified: Secondary | ICD-10-CM

## 2012-09-14 NOTE — Assessment & Plan Note (Signed)
Has not yet quit due to stress.  Will try again-- will call quit line and may make appt with Pharm clinic. F/u 1 month for continued support.

## 2012-09-14 NOTE — Progress Notes (Signed)
S: Pt comes in today for follow up.  H/o HYPERTENSION BP: 129/80 Meds: NONE- has not needed any since starting prozac Symptoms: Headache: No Dizziness: No  Vision changes: No SOB:  No Chest pain: No LE swelling: No Tobacco use: Yes   Tobacco abuse Has not yet quit.  Wasn't able to get the patches.  Got overwhelmed and was unable to quit.  Started seeing her therapist again per her psychiatrist's recommendation.  Didn't want to quit smoking and then start drinking again (is sober 9 months).  Is busier now because school is back in session and she is working.  Feels back to her normal self.    Bought some sugar free candies to put in her mouth in place of cigarette so she doesn't eat more.  Is currently smoking 1ppd- unchanged.     ROS: Per HPI  History  Smoking status  . Current Every Day Smoker -- 1.0 packs/day for 22 years  . Types: Cigarettes  Smokeless tobacco  . Never Used    Comment: form given 10-27-11/patches are not covered under insurance    O:  Filed Vitals:   09/14/12 0936  BP: 129/80  Pulse: 85    Gen: NAD, pleasant  CV: RRR, no murmur Pulm: CTA bilat, no wheezes or crackles Ext: Warm, no edema   A/P: 37 y.o. female p/w tobacco abuse, h/o HTN -See problem list -f/u in 1 month

## 2012-09-14 NOTE — Patient Instructions (Addendum)
It's great that you want to quit smoking!  I always recommend the 1-800-QUIT-NOW line for extra support! I think that they still offer FREE patches!  I would also consider meeting with our Pharmacist, Dr. Raymondo Band, is great with smoking cessation!  Come back to see me in 1 month so we can see how the smoking cessation is going.

## 2012-09-14 NOTE — Assessment & Plan Note (Signed)
No need to restart prior BP meds (propranolol, HCTZ).  Normotensive today.

## 2012-09-28 ENCOUNTER — Ambulatory Visit (INDEPENDENT_AMBULATORY_CARE_PROVIDER_SITE_OTHER): Payer: Medicare Other | Admitting: Licensed Clinical Social Worker

## 2012-09-28 DIAGNOSIS — F319 Bipolar disorder, unspecified: Secondary | ICD-10-CM

## 2012-09-28 NOTE — Progress Notes (Signed)
   THERAPIST PROGRESS NOTE  Session Time: 1:00pm-1:50pm  Participation Level: Active  Behavioral Response: Well GroomedAlertAnxious and Depressed  Type of Therapy: Individual Therapy  Treatment Goals addressed: Anxiety and Coping  Interventions: CBT, DBT, Strength-based, Supportive and Reframing  Summary: [name] is a 37 y.o. female who presents with anxious mood and labile affect. She is pleased to report that she has not been to the grocery store since her last visit and has been able to avoid using this compulsive anxiety reducing strategy. She remains highly anxious about her children and is angry with her son for making a poor decision which has negatively affected her choice of an apartment. She wants to try and increase her Prozac again because she feels she needs this increase. She is easily overwhelmed and finds herself worrying about her family members, particularly her mother. She is fearful of loosing her. 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  Her sleep and appetite are wnl.   Suicidal/Homicidal: Nowithout intent/plan  Therapist Response: Assessed patients current functioning and reviewed progress. Reviewed coping strategies. Assessed patients safety and assisted in identifying protective factors.  Reviewed crisis plan with patient. Assisted patient with the expression of her feelings of anxiety and frustration. Used CBT to assist patient with the identification of negative distortions and irrational thoughts. Encouraged patient to verbalize alternative and factual responses which challenge thought distortions. Used DBT to practice mindfulness, review distraction list and improve distress tolerance skills. Used motivational interviewing to assist and encourage patient through the change process. Explored patients barriers to change. Reviewed patients self care plan. Assessed  progress related to self care. Patient's self care  is good. Recommend proper diet, regular exercise, socialization and recreation.   Plan: Return again in one to two weeks.  Diagnosis: Axis I: bi polar     Axis II: No diagnosis    Trestan Vahle, LCSW 09/28/2012

## 2012-10-05 ENCOUNTER — Ambulatory Visit: Payer: Self-pay | Admitting: Pharmacist

## 2012-10-05 DIAGNOSIS — L723 Sebaceous cyst: Secondary | ICD-10-CM | POA: Diagnosis not present

## 2012-10-05 DIAGNOSIS — L732 Hidradenitis suppurativa: Secondary | ICD-10-CM | POA: Diagnosis not present

## 2012-10-10 ENCOUNTER — Encounter: Payer: Self-pay | Admitting: Family Medicine

## 2012-10-10 ENCOUNTER — Ambulatory Visit (HOSPITAL_COMMUNITY): Payer: Self-pay | Admitting: Psychiatry

## 2012-10-10 ENCOUNTER — Other Ambulatory Visit (HOSPITAL_COMMUNITY): Payer: Self-pay | Admitting: Psychiatry

## 2012-10-10 DIAGNOSIS — F319 Bipolar disorder, unspecified: Secondary | ICD-10-CM

## 2012-10-17 ENCOUNTER — Ambulatory Visit (HOSPITAL_COMMUNITY): Payer: Self-pay | Admitting: Licensed Clinical Social Worker

## 2012-10-20 ENCOUNTER — Other Ambulatory Visit: Payer: Self-pay

## 2012-10-24 ENCOUNTER — Ambulatory Visit (INDEPENDENT_AMBULATORY_CARE_PROVIDER_SITE_OTHER): Payer: Medicare Other | Admitting: Licensed Clinical Social Worker

## 2012-10-24 DIAGNOSIS — F319 Bipolar disorder, unspecified: Secondary | ICD-10-CM | POA: Diagnosis not present
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
## 2012-10-26 ENCOUNTER — Ambulatory Visit (INDEPENDENT_AMBULATORY_CARE_PROVIDER_SITE_OTHER): Payer: Medicare Other | Admitting: Psychiatry

## 2012-10-26 ENCOUNTER — Encounter (HOSPITAL_COMMUNITY): Payer: Self-pay | Admitting: Psychiatry

## 2012-10-26 VITALS — Wt 174.0 lb

## 2012-10-26 DIAGNOSIS — F319 Bipolar disorder, unspecified: Secondary | ICD-10-CM | POA: Diagnosis not present

## 2012-10-26 MED ORDER — TRAZODONE HCL 100 MG PO TABS: 100.0000 mg | ORAL_TABLET | Freq: Every day | ORAL | Status: DC

## 2012-10-26 MED ORDER — OLANZAPINE 20 MG PO TABS: 20.0000 mg | ORAL_TABLET | Freq: Every day | ORAL | Status: DC

## 2012-10-26 NOTE — Progress Notes (Signed)
[facility] [id number] Progress Note  Sandra Miller [id number] 37 y.o.  10/26/2012 11:47 AM  Chief Complaint:  I'm feeling depressed and stressed-out.  One week from now I have no place to live.  History of Present Illness: Patient is 37 year old female who came for her followup appointment.  She continued to endorse depression anxiety symptoms.  Patient told that she is not heard from section 8 housing and she has only one week to leave her current place.  Patient is very stressed about her living situation.  She also misses her daughter who moved out.  Patient lives with her 57 year old son.  She admitted recently been more irritable and angry and having impulsive episodes.  She was very upset on her neighbor due to parking issue .  She denies any aggression or violence but sometimes she feel very angry.  She sleeping on and off.  She admitted crying spells social isolation and doesn't want to do anything.  She is very upset on section 8 housing management but she also understand that nothing she can do.  Patient is in the process of downsizing and looking for a smaller apartment.  On her last visit I recommend to try Prozac 20 mg but after taking a few doses of Prozac 20 mg patient decided to have GI symptoms and get very sick.  She cut down her Prozac 10 mg.  Recently she started antibiotic given by her dermatologist because of having a skin infection.  Patient feel that medicine is not helping and she continues to have his skin lesions on her face.  Patient saw primary care physician to get nicotine patches as she planning to quit smoking but recently she has not tried quitting because she feels under a lot of stress.  She's compliant with trazodone, Prozac 10 mg, Ativan 1 mg and Zyprexa 50 mg at bedtime.  She has headache due to stress.  Her weight remains unchanged.  She's working full-time but feel that her attention and concentration is more focus at her housing.  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  She's not drinking or using any illegal substance.  Suicidal Ideation: No Plan Formed: No Patient has means to carry out plan: No  Homicidal Ideation: No Plan Formed: No Patient has means to carry out plan: No  Review of Systems: Psychiatric: Agitation: Yes Hallucination: No Depressed Mood: Yes Insomnia: Yes Hypersomnia: No Altered Concentration: No Feels Worthless: No Grandiose Ideas: No Belief In Special Powers: No New/Increased Substance Abuse: No Compulsions: No  Neurologic: Headache: Yes Seizure: No Paresthesias: No  Past history. Patient has history of multiple psychiatric inpatient treatment.  She has history of impulsive behavior aggression and violence paranoia and psychotic symptoms.    Medical history. Patient has history of hypertension , migraine headache and GERD.    Psychosocial history . Patient lives with her 64 year old son.  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     Outpatient Encounter Prescriptions as of 10/26/2012  Medication Sig Dispense Refill  . FLUoxetine (PROZAC) 10 MG capsule take 1 capsule by mouth once daily  30 capsule  0  . LORazepam (ATIVAN) 1 MG tablet take 1 tablet by mouth at bedtime  30 tablet  1  . Multiple Vitamin (MULTIVITAMIN) tablet Take 1 tablet by mouth daily.      . mupirocin ointment (BACTROBAN) 2 %       . OLANZapine (ZYPREXA) 20 MG tablet Take 1 tablet (20  mg total) by mouth at bedtime.  30 tablet  0  . traZODone (DESYREL) 100 MG tablet Take 1 tablet (100 mg total) by mouth at bedtime.  30 tablet  0  . [DISCONTINUED] OLANZapine (ZYPREXA) 15 MG tablet Take 1 tablet (15 mg total) by mouth at bedtime.  30 tablet  1  . [DISCONTINUED] traZODone (DESYREL) 100 MG tablet Take 1 tablet (100 mg total) by mouth at bedtime.  30 tablet  1  . acetaminophen (TYLENOL) 500 MG tablet Take 1,000 mg by mouth 2 (two) times daily as needed. For pain      . nicotine (NICODERM CQ - DOSED IN MG/24  HOURS) 14 mg/24hr patch Place 1 patch onto the skin daily.  28 patch  1  . omeprazole (PRILOSEC) 40 MG capsule take 1 capsule by mouth twice a day  30 capsule  11  . Polyvinyl Alcohol-Povidone (REFRESH OP) Apply 2 drops to eye 2 (two) times daily as needed. For dry eyes      . promethazine (PHENERGAN) 25 MG tablet Take 1 tablet (25 mg total) by mouth every 6 (six) hours as needed for nausea.  12 tablet  0  . sulfamethoxazole-trimethoprim (BACTRIM DS) 800-160 MG per tablet       . VITAMIN E PO Take 3 capsules by mouth daily.       No facility-administered encounter medications on file as of 10/26/2012.    Past Psychiatric History/Hospitalization(s): Anxiety: Yes Bipolar Disorder: Yes Depression: Yes Mania: Yes Psychosis: Yes Schizophrenia: No Personality Disorder: No Hospitalization for psychiatric illness: Yes History of Electroconvulsive Shock Therapy: No Prior Suicide Attempts: No  Physical Exam: Constitutional:  Wt 174 lb (78.926 kg)  BMI 28.5 kg/m2  General Appearance: well nourished, tearful and frustrated.  Musculoskeletal: Strength & Muscle Tone: within normal limits Gait & Station: normal Patient leans: N/A  Psychiatric: Speech (describe rate, volume, coherence, spontaneity, and abnormalities if any): Slow but clear and coherent.  Thought Process (describe rate, content, abstract reasoning, and computation): Slow but logical linear and goal-directed.  Associations: Coherent and Relevant  Thoughts: normal  Mental Status: Orientation: oriented to person, place, time/date and situation Mood & Affect: depressed affect Attention Span & Concentration: Fair  Medical Decision Making (Choose Three): Review of Psycho-Social Stressors (1), Established Problem, Worsening (2), Review of Last Therapy Session (1), Review of Medication Regimen & Side Effects (2) and Review of New Medication or Change in Dosage (2)  Assessment: Axis I: Bipolar disorder, NOS  Axis II:  Deferred  Axis III: See medical history  Axis IV: Moderate  Axis V: 60-65   Plan: I review her symptoms, psychosocial stressors and response of medication.  She tried Prozac 20 mg but did not tolerate very well.  We cut down Prozac to 10 mg.  I recommend to try a Zyprexa 20 mg to help her insomnia irritability and mood swing.  Initially patient is resistant to take higher dose of Zyprexa due to weight gain but later she agreed to try 20 mg for time being.  I do believe patient has situational decompensation and crisis.  I recommend to contact her family member if she does not resolve her placement issue in few days.  Patient has mother and good support system where she can stay if she is unable to get apartment in few days.  Patient acknowledged the plan.  We also talk about safety plan that anytime having active suicidal thoughts or homicidal thoughts and she need to call 911 or go to  local emergency room.  Time spent 30 minutes.  I will see her again in 2 weeks.  Gay Moncivais T., MD 10/26/2012

## 2012-10-31 ENCOUNTER — Ambulatory Visit (INDEPENDENT_AMBULATORY_CARE_PROVIDER_SITE_OTHER): Payer: Medicare Other | Admitting: Licensed Clinical Social Worker

## 2012-10-31 DIAGNOSIS — F319 Bipolar disorder, unspecified: Secondary | ICD-10-CM | POA: Diagnosis not present

## 2012-10-31 NOTE — Progress Notes (Signed)
   THERAPIST PROGRESS NOTE  Session Time: 2:00pm-2:50pm  Participation Level: Active  Behavioral Response: Well GroomedAlertAnxious, Depressed and Irritable  Type of Therapy: Individual Therapy  Treatment Goals addressed: Coping  Interventions: CBT, Solution Focused, Strength-based, Supportive and Reframing  Summary: Sandra Miller is a 37 y.o. female who presents with depressed mood and anxious affect. She reports continued high anxiety which is generalized. She is in the midst of moving into her new apartment and is stressed about that and things not being available to her, such as cable TV, which she uses as a primary tool to relax. She is anxious about her children growing up and becoming more independent. She is able to recognize that she has clung to her children since she stopped drinking and that they have become an emotional dependency for her. She discusses how they take care of her and how this started when she was drinking and continues. They cook, clean and even run her bath. She is afraid to live alone, since she has never done this. Her impulsivity is increased. She cut all her hair off during an impulsive moment to relieve anxiety. She is not fearful that she will start drinking again. Her sleep is wnl and her appetite has increased.    Suicidal/Homicidal: Nowithout intent/plan  Therapist Response: Assessed patients current functioning and reviewed progress. Reviewed coping strategies. Assessed patients safety and assisted in identifying protective factors.  Reviewed crisis plan with patient. Assisted patient with the expression of frustration and anxiety. Reviewed patients self care plan. Assessed progress related to self care. Patients self care is good. Recommend daily exercise, increased socialization and recreation. Used CBT to assist patient with the identification of negative distortions and irrational thoughts. Encouraged patient to verbalize alternative and factual  responses which challenge thought distortions. Used DBT to practice mindfulness, review distraction list and improve distress tolerance skills. Reviewed healthy boundaries and assertive communication. Processed patients need for emotional growth and independence.   Plan: Return again in one weeks.  Diagnosis: Axis I: Bipolar, Depressed    Axis II: No diagnosis    Marysol Wellnitz, LCSW 10/31/2012

## 2012-11-01 ENCOUNTER — Telehealth: Payer: Self-pay | Admitting: *Deleted

## 2012-11-01 NOTE — Telephone Encounter (Signed)
Called [phone]  Prior Auth faxed, I filled it out and faxed back.  Response will take 24-48 hrs.

## 2012-11-07 ENCOUNTER — Ambulatory Visit (INDEPENDENT_AMBULATORY_CARE_PROVIDER_SITE_OTHER): Payer: Medicare Other | Admitting: Licensed Clinical Social Worker

## 2012-11-07 DIAGNOSIS — F319 Bipolar disorder, unspecified: Secondary | ICD-10-CM | POA: Diagnosis not present

## 2012-11-07 NOTE — Progress Notes (Signed)
   THERAPIST PROGRESS NOTE  Session Time: 1:00pm-1:50pm  Participation Level: Active  Behavioral Response: Well GroomedAlertAnxious  Type of Therapy: Individual Therapy  Treatment Goals addressed: Coping  Interventions: CBT, Motivational Interviewing, Solution Focused, Strength-based, Supportive and Reframing  Summary: Sandra Miller is a 37 y.o. female who presents with anxious mood and affect. She reports no improvement in her anxiety and is eager to have her medication adjusted again. She is willng to try an increase in Prozac and see if she can tolerate the side effects. She has moved to a new apartment and discuses how this experience caused her increased stress. She is upset with [employer], which is where her children work. She wants to fight their battles for them and struggles to step back and allow them to do this on their own. She has not been exercising or intentionally finding time for self care. Her sleep and appetite are wnl.    Suicidal/Homicidal: Nowithout intent/plan  Therapist Response: Assessed patients current functioning and reviewed progress. Reviewed coping strategies. Assessed patients safety and assisted in identifying protective factors.  Reviewed crisis plan with patient. Assisted patient with the expression of anxeity. Reviewed patients self care plan. Assessed progress related to self care. Patients self care is good, but can be improved. Challenge patient to make time for intentional self care.  Recommend daily exercise, increased socialization and recreation. Used CBT to assist patient with the identification of negative distortions and irrational thoughts. Encouraged patient to verbalize alternative and factual responses which challenge thought distortions. Used motivational interviewing to assist and encourage patient through the change process. Explored patients barriers to change. Reviewed healthy boundaries and assertive communication.   Plan: Return again in  one weeks.  Diagnosis: Axis I: bi polar    Axis II: No diagnosis    NORDEN,KRISTIN, LCSW 11/07/2012

## 2012-11-09 ENCOUNTER — Ambulatory Visit (HOSPITAL_COMMUNITY): Payer: Self-pay | Admitting: Psychiatry

## 2012-11-14 ENCOUNTER — Ambulatory Visit (INDEPENDENT_AMBULATORY_CARE_PROVIDER_SITE_OTHER): Payer: Medicare Other | Admitting: Psychiatry

## 2012-11-14 ENCOUNTER — Encounter (HOSPITAL_COMMUNITY): Payer: Self-pay | Admitting: Psychiatry

## 2012-11-14 VITALS — Wt 172.0 lb

## 2012-11-14 DIAGNOSIS — F319 Bipolar disorder, unspecified: Secondary | ICD-10-CM | POA: Diagnosis not present

## 2012-11-14 MED ORDER — TRAZODONE HCL 100 MG PO TABS: 100.0000 mg | ORAL_TABLET | Freq: Every day | ORAL | Status: DC

## 2012-11-14 MED ORDER — OLANZAPINE 15 MG PO TABS: 15.0000 mg | ORAL_TABLET | Freq: Every day | ORAL | Status: DC

## 2012-11-14 MED ORDER — LORAZEPAM 1 MG PO TABS: ORAL_TABLET | ORAL | Status: DC

## 2012-11-14 MED ORDER — FLUOXETINE HCL 10 MG PO CAPS: ORAL_CAPSULE | ORAL | Status: DC
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
## 2012-11-16 ENCOUNTER — Encounter: Payer: Self-pay | Admitting: Family Medicine

## 2012-11-16 ENCOUNTER — Ambulatory Visit (INDEPENDENT_AMBULATORY_CARE_PROVIDER_SITE_OTHER): Payer: Medicare Other | Admitting: Family Medicine

## 2012-11-16 ENCOUNTER — Ambulatory Visit
Admission: RE | Admit: 2012-11-16 | Discharge: 2012-11-16 | Disposition: A | Discharge status: Home / Self Care | Payer: Medicare Other | Source: Ambulatory Visit | Attending: Family Medicine | Admitting: Family Medicine

## 2012-11-16 ENCOUNTER — Telehealth: Payer: Self-pay | Admitting: Family Medicine

## 2012-11-16 VITALS — BP 140/85 | HR 80 | Ht 65.5 in | Wt 171.2 lb

## 2012-11-16 DIAGNOSIS — M25539 Pain in unspecified wrist: Secondary | ICD-10-CM

## 2012-11-16 DIAGNOSIS — M25531 Pain in right wrist: Secondary | ICD-10-CM | POA: Insufficient documentation

## 2012-11-16 NOTE — Progress Notes (Signed)
Sandra Miller is a 37 y.o. female who presents to [facility] today with complaints of Right wrist pain:  1.  Right wrist pain:  Present for 1 month.  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  No immediate pain when lifting client from floor.  She is also taking online classes and typing much of the day.  Worse with typing and trying to pick up things.  She is right handed.  Can sometimes hear a "pop" in her wrist.  No swelling/redness that she has noticed.    No paresthesias or weakness.    The following portions of the patient's history were reviewed and updated as appropriate: allergies, current medications, past medical history, family and social history, and problem list.  Patient is a nonsmoker.    Past Medical History  Diagnosis Date  . Anxiety   . Hypertension     history of  . Bipolar disorder   . GERD (gastroesophageal reflux disease)   . Hiatal hernia   . HSV   . DISORDER, BIPOLAR NOS   . ALCOHOLISM     history of  . DISORDER, TOBACCO USE   . SUBSTANCE ABUSE     history of  . CONSTIPATION   . Abdominal pain, unspecified site   . EPIGASTRIC PAIN   . HELICOBACTER PYLORI GASTRITIS, HX OF   . NEPHROLITHIASIS, HX OF   . HX, PERSONAL, GENITAL/OBSTETRIC DISORDER NEC   . Esophageal dysphagia    Past Surgical History  Procedure Laterality Date  . Cesarean section  1995  . Knee arthroscopy      left  . Tubal ligation  1995  . [facility]     Medications reviewed. Current Outpatient Prescriptions  Medication Sig Dispense Refill  . acetaminophen (TYLENOL) 500 MG tablet Take 1,000 mg by mouth 2 (two) times daily as needed. For pain      . FLUoxetine (PROZAC) 10 MG capsule take 1 capsule by mouth twice a day  60 capsule  0  . LORazepam (ATIVAN) 1 MG tablet take 1 tablet by mouth at bedtime  30 tablet  0  . Multiple Vitamin (MULTIVITAMIN) tablet Take 1 tablet by mouth daily.      . mupirocin ointment (BACTROBAN) 2 %       .  nicotine (NICODERM CQ - DOSED IN MG/24 HOURS) 14 mg/24hr patch Place 1 patch onto the skin daily.  28 patch  1  . OLANZapine (ZYPREXA) 15 MG tablet Take 1 tablet (15 mg total) by mouth at bedtime.  30 tablet  0  . omeprazole (PRILOSEC) 40 MG capsule take 1 capsule by mouth twice a day  30 capsule  11  . Polyvinyl Alcohol-Povidone (REFRESH OP) Apply 2 drops to eye 2 (two) times daily as needed. For dry eyes      . promethazine (PHENERGAN) 25 MG tablet Take 1 tablet (25 mg total) by mouth every 6 (six) hours as needed for nausea.  12 tablet  0  . sulfamethoxazole-trimethoprim (BACTRIM DS) 800-160 MG per tablet       . traZODone (DESYREL) 100 MG tablet Take 1 tablet (100 mg total) by mouth at bedtime.  30 tablet  0  . VITAMIN E PO Take 3 capsules by mouth daily.       No current facility-administered medications for this visit.    ROS as above otherwise neg.  No chest pain, palpitations, SOB, Fever, Chills, Abd pain, N/V/D.  Physical Exam:  BP 140/85  Pulse 80  Ht 5' 5.5" (1.664 m)  Wt 171 lb 3.2 oz (77.656 kg)  BMI 28.05 kg/m2 Gen:  Alert, cooperative patient who appears stated age in no acute distress.  Vital signs reviewed. MSK:  - Left wrist WNL - Right wrist:  Maximally tender over medial aspect of wrist, surrounding but not directly upon ulnar styloid.  No edema noted.  No erythema.  Pain with pronation/supination.  Handgrip and thumb opposition are 5/5 strength.  Sensation 5/5.  Radial pulse 2+ bilaterally, ulnar pulse is +1 and equal BL.    No results found for this or any previous visit (from the past 72 hour(s)).

## 2012-11-16 NOTE — Telephone Encounter (Signed)
Called to let her know results of her wrist xray.  Had to leave message.  Can call back, but I told her that "everything looked good" without providing details.

## 2012-11-16 NOTE — Patient Instructions (Signed)
I'll prescribe you a wrist brace.    [facility].  Make an appt for Sports medicine on your way out.

## 2012-11-16 NOTE — Assessment & Plan Note (Signed)
Unclear etiology.  Tendonitis a possibility with persistent typing as a trigger.  Patient desires imaging. Will obtain x-rays, provided wrist splint here in clinic.  She doesn't like medications and declines and OTC analgesics.   Referral to Sports Med for ultrasound of wrist.

## 2012-11-27 ENCOUNTER — Ambulatory Visit (HOSPITAL_COMMUNITY): Payer: Self-pay | Admitting: Licensed Clinical Social Worker

## 2012-12-02 ENCOUNTER — Emergency Department (HOSPITAL_COMMUNITY)
Admission: EM | Admit: 2012-12-02 | Discharge: 2012-12-02 | Disposition: A | Discharge status: Home / Self Care | Payer: Medicare Other | Attending: Emergency Medicine | Admitting: Emergency Medicine

## 2012-12-02 DIAGNOSIS — F17200 Nicotine dependence, unspecified, uncomplicated: Secondary | ICD-10-CM | POA: Insufficient documentation

## 2012-12-02 DIAGNOSIS — F411 Generalized anxiety disorder: Secondary | ICD-10-CM | POA: Insufficient documentation

## 2012-12-02 DIAGNOSIS — Z8719 Personal history of other diseases of the digestive system: Secondary | ICD-10-CM | POA: Diagnosis not present

## 2012-12-02 DIAGNOSIS — S058X9A Other injuries of unspecified eye and orbit, initial encounter: Secondary | ICD-10-CM | POA: Insufficient documentation

## 2012-12-02 DIAGNOSIS — Z79899 Other long term (current) drug therapy: Secondary | ICD-10-CM | POA: Insufficient documentation

## 2012-12-02 DIAGNOSIS — I10 Essential (primary) hypertension: Secondary | ICD-10-CM | POA: Diagnosis not present

## 2012-12-02 DIAGNOSIS — Y9389 Activity, other specified: Secondary | ICD-10-CM | POA: Insufficient documentation

## 2012-12-02 DIAGNOSIS — Z8619 Personal history of other infectious and parasitic diseases: Secondary | ICD-10-CM | POA: Diagnosis not present

## 2012-12-02 DIAGNOSIS — Z23 Encounter for immunization: Secondary | ICD-10-CM | POA: Diagnosis not present

## 2012-12-02 DIAGNOSIS — Z8742 Personal history of other diseases of the female genital tract: Secondary | ICD-10-CM | POA: Insufficient documentation

## 2012-12-02 DIAGNOSIS — F319 Bipolar disorder, unspecified: Secondary | ICD-10-CM | POA: Insufficient documentation

## 2012-12-02 DIAGNOSIS — Y929 Unspecified place or not applicable: Secondary | ICD-10-CM | POA: Insufficient documentation

## 2012-12-02 DIAGNOSIS — Z87442 Personal history of urinary calculi: Secondary | ICD-10-CM | POA: Diagnosis not present

## 2012-12-02 DIAGNOSIS — W64XXXA Exposure to other animate mechanical forces, initial encounter: Secondary | ICD-10-CM | POA: Insufficient documentation

## 2012-12-02 DIAGNOSIS — K219 Gastro-esophageal reflux disease without esophagitis: Secondary | ICD-10-CM | POA: Insufficient documentation

## 2012-12-02 DIAGNOSIS — F1021 Alcohol dependence, in remission: Secondary | ICD-10-CM | POA: Diagnosis not present

## 2012-12-02 DIAGNOSIS — S0502XA Injury of conjunctiva and corneal abrasion without foreign body, left eye, initial encounter: Secondary | ICD-10-CM

## 2012-12-02 MED ORDER — TETRACAINE HCL 0.5 % OP SOLN
2.0000 [drp] | Freq: Once | OPHTHALMIC | Status: AC
  Administered 2012-12-02: 2 [drp] via OPHTHALMIC
  Filled 2012-12-02: qty 2

## 2012-12-02 MED ORDER — ERYTHROMYCIN 5 MG/GM OP OINT: TOPICAL_OINTMENT | OPHTHALMIC | Status: DC

## 2012-12-02 MED ORDER — KETOROLAC TROMETHAMINE 0.5 % OP SOLN: 1.0000 [drp] | Freq: Four times a day (QID) | OPHTHALMIC | Status: DC

## 2012-12-02 MED ORDER — TETANUS-DIPHTH-ACELL PERTUSSIS 5-2.5-18.5 LF-MCG/0.5 IM SUSP
0.5000 mL | Freq: Once | INTRAMUSCULAR | Status: AC
  Administered 2012-12-02: 0.5 mL via INTRAMUSCULAR
  Filled 2012-12-02: qty 0.5

## 2012-12-02 MED ORDER — FLUORESCEIN SODIUM 1 MG OP STRP
1.0000 | ORAL_STRIP | Freq: Once | OPHTHALMIC | Status: AC
  Administered 2012-12-02: 1 via OPHTHALMIC
  Filled 2012-12-02: qty 1

## 2012-12-02 NOTE — ED Provider Notes (Signed)
Medical screening examination/treatment/procedure(s) were performed by non-physician practitioner and as supervising physician I was immediately available for consultation/collaboration.  Derwood Kaplan, MD 12/02/12 [phone]

## 2012-12-02 NOTE — ED Notes (Signed)
Pt states she was playing with her boxer and he pawed at her face and scratched her L eye. Eye is painful and watering now.

## 2012-12-02 NOTE — ED Provider Notes (Signed)
History  This chart was scribed for Marlon Pel, PA, non-physician practitioner working with Derwood Kaplan, MD by Charolett Bumpers, ED Scribe. This patient was seen in room WTR8/WTR8 and the patient's care was started at 6:17 PM.   CSN: [id number]  Arrival date & time 12/02/12  1728  First MD Initiated Contact with Patient 12/02/2012 6:17 PM     Chief Complaint  Patient presents with  . Eye Injury    The history is provided by the patient. No language interpreter was used.  Sandra Miller is a 37 y.o. female who presents to the Emergency Department complaining of sudden onset, left eye injury. She states that she was playing with her dog, a boxer, who clawed her face and scratched her left eye earlier this morning. She reports associated pain, redness and watery that has gradually been getting worse as the day progressed. She denies any changes in her vision. She is unsure of when her Tetanus was last updated.   Past Medical History  Diagnosis Date  . Anxiety   . Hypertension     history of  . Bipolar disorder   . GERD (gastroesophageal reflux disease)   . Hiatal hernia   . HSV   . DISORDER, BIPOLAR NOS   . ALCOHOLISM     history of  . DISORDER, TOBACCO USE   . SUBSTANCE ABUSE     history of  . CONSTIPATION   . Abdominal pain, unspecified site   . EPIGASTRIC PAIN   . HELICOBACTER PYLORI GASTRITIS, HX OF   . NEPHROLITHIASIS, HX OF   . HX, PERSONAL, GENITAL/OBSTETRIC DISORDER NEC   . Esophageal dysphagia     Past Surgical History  Procedure Laterality Date  . Cesarean section  1995  . Knee arthroscopy      left  . Tubal ligation  1995  . [facility]     Family History  Problem Relation Age of Onset  . Stomach cancer Paternal Grandfather   . Diabetes Maternal Grandfather   . Diabetes Paternal Grandmother   . Heart disease Father   . Lung cancer Father   . Heart disease Mother   . Depression Mother   . Colon polyps  Brother   . Kidney disease Maternal Uncle   . Cirrhosis Cousin     alcoholic  . Anxiety disorder Maternal Aunt   . Depression Maternal Aunt     History  Substance Use Topics  . Smoking status: Current Every Day Smoker -- 1.00 packs/day for 22 years    Types: Cigarettes  . Smokeless tobacco: Never Used     Comment: form given 10-27-11/patches are not covered under insurance  . Alcohol Use: No     Comment: sober for 9 months    OB History   Grav Para Term Preterm Abortions TAB SAB Ect Mult Living                  Review of Systems  Eyes: Positive for pain and redness. Negative for visual disturbance.  All other systems reviewed and are negative.    Allergies  Aspirin and Minocycline  Home Medications   Current Outpatient Rx  Name  Route  Sig  Dispense  Refill  . acetaminophen (TYLENOL) 500 MG tablet   Oral   Take 1,000 mg by mouth 2 (two) times daily as needed. For pain         . FLUoxetine (PROZAC) 10 MG capsule  take 1 capsule by mouth twice a day   60 capsule   0   . LORazepam (ATIVAN) 1 MG tablet      take 1 tablet by mouth at bedtime   30 tablet   0   . Multiple Vitamin (MULTIVITAMIN) tablet   Oral   Take 1 tablet by mouth daily.         . mupirocin ointment (BACTROBAN) 2 %   Topical   Apply 1 application topically daily.          Marland Kitchen OLANZapine (ZYPREXA) 15 MG tablet   Oral   Take 1 tablet (15 mg total) by mouth at bedtime.   30 tablet   0   . omeprazole (PRILOSEC) 40 MG capsule      take 1 capsule by mouth twice a day   30 capsule   11   . traZODone (DESYREL) 100 MG tablet   Oral   Take 1 tablet (100 mg total) by mouth at bedtime.   30 tablet   0   . erythromycin ophthalmic ointment      Place a 1/2 inch ribbon of ointment into the lower eyelid QID for 10 days   3.5 g   0   . ketorolac (ACULAR) 0.5 % ophthalmic solution   Left Eye   Place 1 drop into the left eye every 6 (six) hours.   5 mL   0   . EXPIRED:  promethazine (PHENERGAN) 25 MG tablet   Oral   Take 1 tablet (25 mg total) by mouth every 6 (six) hours as needed for nausea.   12 tablet   0     BP 151/76  Pulse 103  Temp(Src) 98.8 F (37.1 C) (Oral)  SpO2 99%  Physical Exam  Nursing note and vitals reviewed. Constitutional: She is oriented to person, place, and time. She appears well-developed and well-nourished. No distress.  HENT:  Head: Normocephalic and atraumatic.  Eyes: EOM and lids are normal. Pupils are equal, round, and reactive to light. Left conjunctiva is injected. Left conjunctiva has no hemorrhage.    Neck: Neck supple. No tracheal deviation present.  Cardiovascular: Normal rate.   Pulmonary/Chest: Effort normal. No respiratory distress.  Musculoskeletal: Normal range of motion.  Neurological: She is alert and oriented to person, place, and time.  Skin: Skin is warm and dry.  Psychiatric: She has a normal mood and affect. Her behavior is normal.    ED Course  Procedures (including critical care time)  DIAGNOSTIC STUDIES: Oxygen Saturation is 99% on RA, normal by my interpretation.    COORDINATION OF CARE:  6:19 PM-Discussed planned course of treatment with the patient including tetracaine and examining eye with fluorescein, who is agreeable at this time.   6:21 PM-Informed pt of findings on fundoscopic exam, corneal abrasion. Will start pt on abx empirically. Will d/c home with eye patch.    Labs Reviewed - No data to display No results found.   1. Corneal abrasion, left, initial encounter       MDM  Pt has been advised of the symptoms that warrant their return to the ED. Patient has voiced understanding and has agreed to follow-up with the PCP or specialist.  I personally performed the services described in this documentation, which was scribed in my presence. The recorded information has been reviewed and is accurate.      Dorthula Matas, PA-C 12/02/12 1842

## 2012-12-03 ENCOUNTER — Ambulatory Visit: Payer: Self-pay | Admitting: Sports Medicine

## 2012-12-04 ENCOUNTER — Ambulatory Visit (HOSPITAL_COMMUNITY): Payer: Self-pay | Admitting: Licensed Clinical Social Worker

## 2012-12-10 ENCOUNTER — Encounter (HOSPITAL_COMMUNITY): Payer: Self-pay | Admitting: Psychiatry

## 2012-12-10 ENCOUNTER — Ambulatory Visit (INDEPENDENT_AMBULATORY_CARE_PROVIDER_SITE_OTHER): Payer: Medicare Other | Admitting: Psychiatry

## 2012-12-10 VITALS — Wt 170.0 lb

## 2012-12-10 DIAGNOSIS — F319 Bipolar disorder, unspecified: Secondary | ICD-10-CM

## 2012-12-10 MED ORDER — LORAZEPAM 1 MG PO TABS: ORAL_TABLET | ORAL | Status: DC

## 2012-12-10 MED ORDER — FLUOXETINE HCL 10 MG PO CAPS: ORAL_CAPSULE | ORAL | Status: DC

## 2012-12-10 MED ORDER — TRAZODONE HCL 100 MG PO TABS: 100.0000 mg | ORAL_TABLET | Freq: Every day | ORAL | Status: DC

## 2012-12-10 MED ORDER — OLANZAPINE 15 MG PO TABS
15.0000 mg | ORAL_TABLET | Freq: Every day | ORAL | Status: DC
Start: 2012-12-10 — End: 2012-12-25

## 2012-12-10 NOTE — Progress Notes (Signed)
[facility] [id number] Progress Note  Sandra Miller [id number] 37 y.o.  12/10/2012 4:07 PM  Chief Complaint:    I'm doing better.  History of Present Illness: Patient is 37 year old female who came for her followup appointment.   She is taking Prozac 10 mg twice a day.  She also reduce her Zyprexa from 20 mg to 15 mg a year she has lost some weight from the past.  She feels tired as finally she moved in.  There are times when she is unable to sleep but overall she feels less depressed and less anxious.  She is living with her son who is start a new job soon.  His daughter is moving out .  Patient denies any crying spells.  She is tolerating her medication without side effects.  She's not been using any illegal substances.  She scheduled to see Belenda Cruise on April 9.   Suicidal Ideation: No Plan Formed: No Patient has means to carry out plan: No  Homicidal Ideation: No Plan Formed: No Patient has means to carry out plan: No  Review of Systems: Psychiatric: Agitation: No Hallucination: No Depressed Mood: No Insomnia: Yes Hypersomnia: No Altered Concentration: No Feels Worthless: No Grandiose Ideas: No Belief In Special Powers: No New/Increased Substance Abuse: No Compulsions: No  Neurologic: Headache: No Seizure: No Paresthesias: No  Past history. Patient has history of multiple psychiatric inpatient treatment.  She has history of impulsive behavior aggression and violence paranoia and psychotic symptoms.    Medical history. Patient has history of hypertension , migraine headache and GERD.    Psychosocial history . Patient lives with her 73 year old son.  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     Outpatient Encounter Prescriptions as of 12/10/2012  Medication Sig Dispense Refill  . FLUoxetine (PROZAC) 10 MG capsule take 1 capsule by mouth twice a day  60 capsule  1  . LORazepam (ATIVAN) 1 MG tablet take 1 tablet by mouth at bedtime  30 tablet   1  . OLANZapine (ZYPREXA) 15 MG tablet Take 1 tablet (15 mg total) by mouth at bedtime.  30 tablet  1  . traZODone (DESYREL) 100 MG tablet Take 1 tablet (100 mg total) by mouth at bedtime.  30 tablet  1  . [DISCONTINUED] FLUoxetine (PROZAC) 10 MG capsule take 1 capsule by mouth twice a day  60 capsule  0  . [DISCONTINUED] LORazepam (ATIVAN) 1 MG tablet take 1 tablet by mouth at bedtime  30 tablet  0  . [DISCONTINUED] OLANZapine (ZYPREXA) 15 MG tablet Take 1 tablet (15 mg total) by mouth at bedtime.  30 tablet  0  . [DISCONTINUED] traZODone (DESYREL) 100 MG tablet Take 1 tablet (100 mg total) by mouth at bedtime.  30 tablet  0  . acetaminophen (TYLENOL) 500 MG tablet Take 1,000 mg by mouth 2 (two) times daily as needed. For pain      . erythromycin ophthalmic ointment Place a 1/2 inch ribbon of ointment into the lower eyelid QID for 10 days  3.5 g  0  . ketorolac (ACULAR) 0.5 % ophthalmic solution Place 1 drop into the left eye every 6 (six) hours.  5 mL  0  . Multiple Vitamin (MULTIVITAMIN) tablet Take 1 tablet by mouth daily.      . mupirocin ointment (BACTROBAN) 2 % Apply 1 application topically daily.       Marland Kitchen omeprazole (PRILOSEC) 40 MG capsule take 1 capsule by mouth  twice a day  30 capsule  11  . promethazine (PHENERGAN) 25 MG tablet Take 1 tablet (25 mg total) by mouth every 6 (six) hours as needed for nausea.  12 tablet  0   No facility-administered encounter medications on file as of 12/10/2012.    Past Psychiatric History/Hospitalization(s): Anxiety: Yes Bipolar Disorder: Yes Depression: Yes Mania: Yes Psychosis: Yes Schizophrenia: No Personality Disorder: No Hospitalization for psychiatric illness: Yes History of Electroconvulsive Shock Therapy: No Prior Suicide Attempts: No  Physical Exam: Constitutional:  Wt 170 lb (77.111 kg)  BMI 27.85 kg/m2  General Appearance: well nourished, tearful and frustrated.  Musculoskeletal: Strength & Muscle Tone: within normal  limits Gait & Station: normal Patient leans: N/A  Psychiatric: Speech (describe rate, volume, coherence, spontaneity, and abnormalities if any): Slow but clear and coherent.  Thought Process (describe rate, content, abstract reasoning, and computation): Slow but logical linear and goal-directed.  Associations: Coherent and Relevant  Thoughts: normal  Mental Status: Orientation: oriented to person, place, time/date and situation Mood & Affect: depressed affect Attention Span & Concentration: Fair  Medical Decision Making (Choose Three): Established Problem, Stable/Improving (1), Review of Psycho-Social Stressors (1), Review of Last Therapy Session (1) and Review of Medication Regimen & Side Effects (2)  Assessment: Axis I: Bipolar disorder, NOS  Axis II: Deferred  Axis III: See medical history  Axis IV: Moderate  Axis V: 60-65   Plan: I will  continue her current psychiatric medication which is Zyprexa 50 mg at bedtime, trazodone 100 mg at bedtime, lorazepam 1 mg at bedtime and Prozac 10 mg twice a day.  Risks and benefits explained.  Recommend to call us back if she is a question of concern for the listing of the symptom.  I will see him again in 2 months.  Dail Lerew T., MD 12/10/2012

## 2012-12-12 ENCOUNTER — Ambulatory Visit (INDEPENDENT_AMBULATORY_CARE_PROVIDER_SITE_OTHER): Payer: Medicare Other | Admitting: Licensed Clinical Social Worker

## 2012-12-12 ENCOUNTER — Ambulatory Visit (HOSPITAL_COMMUNITY): Payer: Self-pay | Admitting: Psychiatry

## 2012-12-12 DIAGNOSIS — F319 Bipolar disorder, unspecified: Secondary | ICD-10-CM

## 2012-12-12 NOTE — Progress Notes (Signed)
   THERAPIST PROGRESS NOTE  Session Time: 9:30am-10:20am  Participation Level: Active  Behavioral Response: Well GroomedAlertAnxious  Type of Therapy: Individual Therapy  Treatment Goals addressed: Coping  Interventions: CBT, DBT, Strength-based, Supportive and Reframing  Summary: KRUTI HORACEK is a 37 y.o. female who presents with anxious mood and affect. She reports some improvement in her depression and anxiety since the increase in her Prozac. She is not taking the increased dose all at once for fear that she will have too much stomach upset. She has been experimenting with the best time to take the second dose since it is keeping her up at night. She processes her stress related to her children, finances and her ex-husbands unwillingness to pay child support. She is angry that her ex-husband does not take responsibility for his children and that he does not return his children's phone calls. She continues to work and enjoys her job. She is focusing on more intentional self care and plans to take a crocheting class today to reduce her stress. Her appetite is fair.  She has marked one year of sobriety and is very pleased about this.   Suicidal/Homicidal: Nowithout intent/plan  Therapist Response: Assessed patients current functioning and reviewed progress. Reviewed coping strategies. Assessed patients safety and assisted in identifying protective factors.  Reviewed crisis plan with patient. Assisted patient with the expression of anxiety. Reviewed patients self care plan. Assessed progress related to self care. Patients self care is good. Recommend daily exercise, increased socialization and recreation. Used CBT to assist patient with the identification of negative distortions and irrational thoughts. Encouraged patient to verbalize alternative and factual responses which challenge thought distortions. Used motivational interviewing to assist and encourage patient through the change process.  Explored patients barriers to change. Strongly encourage patient to begin exercising. Explored patients automatic reaction of anxiety when under stress of any kind and explored alternative responses.   Plan: Return again in two weeks.  Diagnosis: Axis I: bi polar    Axis II: No diagnosis    Ji Feldner, LCSW 12/12/2012

## 2012-12-14 ENCOUNTER — Ambulatory Visit: Payer: Medicare Other | Admitting: Sports Medicine

## 2012-12-14 ENCOUNTER — Encounter: Payer: Self-pay | Admitting: Family Medicine

## 2012-12-19 ENCOUNTER — Ambulatory Visit (INDEPENDENT_AMBULATORY_CARE_PROVIDER_SITE_OTHER): Payer: Medicare Other | Admitting: Licensed Clinical Social Worker

## 2012-12-19 DIAGNOSIS — F319 Bipolar disorder, unspecified: Secondary | ICD-10-CM

## 2012-12-19 NOTE — Progress Notes (Signed)
   THERAPIST PROGRESS NOTE  Session Time: 9:30am-10:20am  Participation Level: Active  Behavioral Response: Well GroomedAlertAnxious  Type of Therapy: Individual Therapy  Treatment Goals addressed: Anxiety and Coping  Interventions: CBT, DBT, Solution Focused, Strength-based, Supportive and Reframing  Summary: Sandra Miller is a 37 y.o. female who presents with anxious mood and affect. She is tired and reports trouble sleeping. She is taking naps due to increased fatigue from increased dose of Prozac. She tries to avoid napping but she finds this difficult. Her appetite remains poor and she has been eating cheesecake and drinking soda. She is nauseous often and finds food unappealing. She processes her stress related to her niece and how she relies upon patient. She enables her nieces addiction and irresponsible behavior and finds it difficult to say no to her. She remains sober.   Suicidal/Homicidal: Nowithout intent/plan  Therapist Response: Assessed patients current functioning and reviewed progress. Reviewed coping strategies. Assessed patients safety and assisted in identifying protective factors.  Reviewed crisis plan with patient. Assisted patient with the expression of anxiety and stress. Reviewed patients self care plan. Assessed progress related to self care. Patients self care is fair. Recommend daily exercise, increased socialization and recreation. Used CBT to assist patient with the identification of negative distortions and irrational thoughts. Encouraged patient to verbalize alternative and factual responses which challenge thought distortions. Used DBT to practice mindfulness, review distraction list and improve distress tolerance skills. Reviewed healthy boundaries and assertive communication. Used motivational interviewing to assist and encourage patient through the change process. Explored patients barriers to change. Challenged patients codependent behavioral patterns.    Plan: Return again in two weeks.  Diagnosis: Axis I: bi polar    Axis II: No diagnosis    Mykalah Saari, LCSW 12/19/2012

## 2012-12-25 ENCOUNTER — Encounter (HOSPITAL_COMMUNITY): Payer: Self-pay | Admitting: *Deleted

## 2012-12-25 ENCOUNTER — Telehealth: Payer: Self-pay | Admitting: Gastroenterology

## 2012-12-25 ENCOUNTER — Emergency Department (HOSPITAL_COMMUNITY)
Admission: EM | Admit: 2012-12-25 | Discharge: 2012-12-25 | Disposition: A | Discharge status: Home / Self Care | Payer: Medicare Other | Attending: Emergency Medicine | Admitting: Emergency Medicine

## 2012-12-25 DIAGNOSIS — F319 Bipolar disorder, unspecified: Secondary | ICD-10-CM | POA: Insufficient documentation

## 2012-12-25 DIAGNOSIS — K219 Gastro-esophageal reflux disease without esophagitis: Secondary | ICD-10-CM | POA: Insufficient documentation

## 2012-12-25 DIAGNOSIS — Z8742 Personal history of other diseases of the female genital tract: Secondary | ICD-10-CM | POA: Insufficient documentation

## 2012-12-25 DIAGNOSIS — I10 Essential (primary) hypertension: Secondary | ICD-10-CM | POA: Insufficient documentation

## 2012-12-25 DIAGNOSIS — Z8719 Personal history of other diseases of the digestive system: Secondary | ICD-10-CM | POA: Diagnosis not present

## 2012-12-25 DIAGNOSIS — R1084 Generalized abdominal pain: Secondary | ICD-10-CM | POA: Diagnosis not present

## 2012-12-25 DIAGNOSIS — R110 Nausea: Secondary | ICD-10-CM | POA: Diagnosis not present

## 2012-12-25 DIAGNOSIS — Z79899 Other long term (current) drug therapy: Secondary | ICD-10-CM | POA: Diagnosis not present

## 2012-12-25 DIAGNOSIS — R109 Unspecified abdominal pain: Secondary | ICD-10-CM

## 2012-12-25 DIAGNOSIS — Z87442 Personal history of urinary calculi: Secondary | ICD-10-CM | POA: Insufficient documentation

## 2012-12-25 DIAGNOSIS — Z3202 Encounter for pregnancy test, result negative: Secondary | ICD-10-CM | POA: Insufficient documentation

## 2012-12-25 DIAGNOSIS — F17200 Nicotine dependence, unspecified, uncomplicated: Secondary | ICD-10-CM | POA: Diagnosis not present

## 2012-12-25 DIAGNOSIS — Z8619 Personal history of other infectious and parasitic diseases: Secondary | ICD-10-CM | POA: Diagnosis not present

## 2012-12-25 LAB — CBC WITH DIFFERENTIAL/PLATELET
Basophils Absolute: 0 10*3/uL (ref 0.0–0.1)
Eosinophils Absolute: 0.1 10*3/uL (ref 0.0–0.7)
Lymphocytes Relative: 27 % (ref 12–46)
Lymphs Abs: 2.9 10*3/uL (ref 0.7–4.0)
MCH: 29.1 pg (ref 26.0–34.0)
Neutrophils Relative %: 66 % (ref 43–77)
Platelets: 308 10*3/uL (ref 150–400)
RBC: 4.46 MIL/uL (ref 3.87–5.11)
WBC: 10.9 10*3/uL — ABNORMAL HIGH (ref 4.0–10.5)

## 2012-12-25 LAB — URINALYSIS, MICROSCOPIC ONLY
Bilirubin Urine: NEGATIVE
Glucose, UA: NEGATIVE mg/dL
Hgb urine dipstick: NEGATIVE
Specific Gravity, Urine: 1.008 (ref 1.005–1.030)
Urobilinogen, UA: 0.2 mg/dL (ref 0.0–1.0)
pH: 6.5 (ref 5.0–8.0)

## 2012-12-25 LAB — COMPREHENSIVE METABOLIC PANEL
ALT: 8 U/L (ref 0–35)
AST: 14 U/L (ref 0–37)
Alkaline Phosphatase: 53 U/L (ref 39–117)
GFR calc Af Amer: >90 mL/min (ref >90)
Glucose, Bld: 99 mg/dL (ref 70–99)
Potassium: 3.3 mEq/L — ABNORMAL LOW (ref 3.5–5.1)
Sodium: 139 mEq/L (ref 135–145)
Total Protein: 7.1 g/dL (ref 6.0–8.3)

## 2012-12-25 LAB — POCT PREGNANCY, URINE: Preg Test, Ur: NEGATIVE

## 2012-12-25 MED ORDER — ONDANSETRON HCL 4 MG/2ML IJ SOLN
4.0000 mg | Freq: Once | INTRAMUSCULAR | Status: AC
  Administered 2012-12-25: 4 mg via INTRAVENOUS
  Filled 2012-12-25: qty 2

## 2012-12-25 MED ORDER — MORPHINE SULFATE 4 MG/ML IJ SOLN
6.0000 mg | Freq: Once | INTRAMUSCULAR | Status: DC
  Filled 2012-12-25: qty 2

## 2012-12-25 MED ORDER — GI COCKTAIL ~~LOC~~
30.0000 mL | Freq: Once | ORAL | Status: AC
  Administered 2012-12-25: 30 mL via ORAL
  Filled 2012-12-25: qty 30

## 2012-12-25 NOTE — ED Notes (Signed)
Pt reports abdominal pain/ acid reflux x2 weeks. Today pain is "unbearable" 10/10. Nauseas, no vomiting. Called GI dr but will not be able to be seen for over a week.

## 2012-12-25 NOTE — Telephone Encounter (Signed)
By the time I called the pt, she was at the ER. She states her stomach feels as though it's on fire and she takes 40mg  of prilosec BID. She states the ER wants to give her Morphine and she doesn't want it, but they offered her a GI cocktail. Advised pt to accept the GI cocktail to see if it helps and we will see her in the am. Pt stated understanding.

## 2012-12-25 NOTE — ED Provider Notes (Signed)
History     CSN: [id number]  Arrival date & time 12/25/12  1108   First MD Initiated Contact with Patient 12/25/12 1143      Chief Complaint  Patient presents with  . Abdominal Pain  . acid reflux      The history is provided by the patient.   patient reports generalized abdominal discomfort with nausea no vomiting over the past 24-48 hours.  She reports she's had "acid reflux" symptoms of the past 2 weeks.  She follows with the [facility], she called in for follow up and is scheduled for tomorrow morning at 8:30 AM however her symptoms are more severe that she can emergency department.  Nausea without vomiting.  No diarrhea.  She reports long-standing history of intermittent abdominal pain similar to this.  Nothing worsens or improves her symptoms.  Symptoms are moderate in severity.  She's not tried any medications.  She said she's compliant with her omeprazole.  No fevers or chills.  No chest pain shortness breath  Past Medical History  Diagnosis Date  . Anxiety   . Hypertension     history of  . Bipolar disorder   . GERD (gastroesophageal reflux disease)   . Hiatal hernia   . HSV   . DISORDER, BIPOLAR NOS   . ALCOHOLISM     history of  . DISORDER, TOBACCO USE   . SUBSTANCE ABUSE     history of  . CONSTIPATION   . Abdominal pain, unspecified site   . EPIGASTRIC PAIN   . HELICOBACTER PYLORI GASTRITIS, HX OF   . NEPHROLITHIASIS, HX OF   . HX, PERSONAL, GENITAL/OBSTETRIC DISORDER NEC   . Esophageal dysphagia     Past Surgical History  Procedure Laterality Date  . Cesarean section  1995  . Knee arthroscopy      left  . Tubal ligation  1995  . [facility]     Family History  Problem Relation Age of Onset  . Stomach cancer Paternal Grandfather   . Diabetes Maternal Grandfather   . Diabetes Paternal Grandmother   . Heart disease Father   . Lung cancer Father   . Heart disease Mother   . Depression Mother   . Colon  polyps Brother   . Kidney disease Maternal Uncle   . Cirrhosis Cousin     alcoholic  . Anxiety disorder Maternal Aunt   . Depression Maternal Aunt     History  Substance Use Topics  . Smoking status: Current Every Day Smoker -- 1.00 packs/day for 22 years    Types: Cigarettes  . Smokeless tobacco: Never Used     Comment: form given 10-27-11/patches are not covered under insurance  . Alcohol Use: No     Comment: sober for 9 months    OB History   Grav Para Term Preterm Abortions TAB SAB Ect Mult Living                  Review of Systems  All other systems reviewed and are negative.    Allergies  Aspirin and Minocycline  Home Medications   Current Outpatient Rx  Name  Route  Sig  Dispense  Refill  . acetaminophen (TYLENOL) 500 MG tablet   Oral   Take 1,000 mg by mouth 2 (two) times daily as needed. For pain         . FLUoxetine (PROZAC) 10 MG capsule   Oral   Take 10  mg by mouth 2 (two) times daily.         Marland Kitchen LORazepam (ATIVAN) 1 MG tablet   Oral   Take 1 mg by mouth every 6 (six) hours as needed for anxiety.         . Multiple Vitamin (MULTIVITAMIN) tablet   Oral   Take 1 tablet by mouth daily.         Marland Kitchen OLANZapine (ZYPREXA) 15 MG tablet   Oral   Take 15 mg by mouth at bedtime.         Marland Kitchen omeprazole (PRILOSEC) 40 MG capsule   Oral   Take 40 mg by mouth 2 (two) times daily.         . traZODone (DESYREL) 100 MG tablet   Oral   Take 100 mg by mouth at bedtime.         Marland Kitchen EXPIRED: promethazine (PHENERGAN) 25 MG tablet   Oral   Take 1 tablet (25 mg total) by mouth every 6 (six) hours as needed for nausea.   12 tablet   0     BP 158/78  Pulse 101  Temp(Src) 99 F (37.2 C) (Oral)  Resp 18  SpO2 98%  Physical Exam  Nursing note and vitals reviewed. Constitutional: She is oriented to person, place, and time. She appears well-developed and well-nourished. No distress.  HENT:  Head: Normocephalic and atraumatic.  Eyes: EOM are normal.   Neck: Normal range of motion.  Cardiovascular: Normal rate, regular rhythm and normal heart sounds.   Pulmonary/Chest: Effort normal and breath sounds normal.  Abdominal: Soft. She exhibits no distension. There is no tenderness.  Musculoskeletal: Normal range of motion.  Neurological: She is alert and oriented to person, place, and time.  Skin: Skin is warm and dry.  Psychiatric: She has a normal mood and affect. Judgment normal.    ED Course  Procedures (including critical care time)  Labs Reviewed  CBC WITH DIFFERENTIAL - Abnormal; Notable for the following:    WBC 10.9 (*)    All other components within normal limits  COMPREHENSIVE METABOLIC PANEL - Abnormal; Notable for the following:    Potassium 3.3 (*)    All other components within normal limits  LIPASE, BLOOD  URINALYSIS, MICROSCOPIC ONLY  POCT PREGNANCY, URINE   No results found.   1. Abdominal pain       MDM  2:39 PM Patient feels much better at this time after pain meds and GI cocktail.  Abdominal exam is completely benign.  No indication for imaging.  She has followup with the gastroenterologist tomorrow morning at 8:30 AM.  A requested that she keep this followup appointment.  She understands to return to the emergency department for new or worsening symptoms        Lyanne Co, MD 12/25/12 1440

## 2012-12-25 NOTE — ED Notes (Signed)
Pt refuse Morphine stated it made her feel" funny"

## 2012-12-26 ENCOUNTER — Ambulatory Visit (INDEPENDENT_AMBULATORY_CARE_PROVIDER_SITE_OTHER): Payer: Medicare Other | Admitting: Gastroenterology

## 2012-12-26 ENCOUNTER — Encounter: Payer: Self-pay | Admitting: Gastroenterology

## 2012-12-26 ENCOUNTER — Ambulatory Visit (HOSPITAL_COMMUNITY): Payer: Self-pay | Admitting: Licensed Clinical Social Worker

## 2012-12-26 VITALS — BP 110/68 | HR 68 | Ht 66.0 in | Wt 169.0 lb

## 2012-12-26 DIAGNOSIS — K219 Gastro-esophageal reflux disease without esophagitis: Secondary | ICD-10-CM

## 2012-12-26 DIAGNOSIS — K30 Functional dyspepsia: Secondary | ICD-10-CM

## 2012-12-26 DIAGNOSIS — K3189 Other diseases of stomach and duodenum: Secondary | ICD-10-CM

## 2012-12-26 DIAGNOSIS — R1013 Epigastric pain: Secondary | ICD-10-CM | POA: Diagnosis not present

## 2012-12-26 MED ORDER — PANTOPRAZOLE SODIUM 40 MG PO TBEC: 40.0000 mg | DELAYED_RELEASE_TABLET | Freq: Every day | ORAL | Status: DC

## 2012-12-26 MED ORDER — SUCRALFATE 1 G PO TABS: 1.0000 g | ORAL_TABLET | Freq: Four times a day (QID) | ORAL | Status: DC

## 2012-12-26 MED ORDER — ESOMEPRAZOLE MAGNESIUM 40 MG PO CPDR: 40.0000 mg | DELAYED_RELEASE_CAPSULE | Freq: Two times a day (BID) | ORAL | Status: DC

## 2012-12-26 NOTE — Addendum Note (Signed)
Addended by: Marlowe Kays on: 12/26/2012 03:40 PM   Modules accepted: Orders

## 2012-12-26 NOTE — Progress Notes (Signed)
12/26/2012 Sandra Miller [id number] [birth year]   History of Present Illness:  Patient is a pleasant 37 year old female who is a patient of Dr. Norval Gable.  She presents to our office today complaining of severe stomach burning.  Says that it began about 2 weeks ago, but has worsened over the past couple of days.  Was so severe yesterday that she went to the ED.  They wanted to give her morphine, but she declined.  They gave her a GI cocktail but that did not help either.  She says that it burns so much that she was crying yesterday and it was making her nauseated.  She has been taking omeprazole 40 mg BID for quite some time and she does not know if it is working for her anymore.  Her lipase, CBC, and CMET were normal.  EGD 11/2011 was normal.  Current Medications, Allergies, Past Medical History, Past Surgical History, Family History and Social History were reviewed in [employer] record.   Physical Exam: BP 110/68  Pulse 68  Ht 5\' 6"  (1.676 m)  Wt 169 lb (76.658 kg)  BMI 27.29 kg/m2 General: Well developed female in no acute distress Head: Normocephalic and atraumatic Eyes:  sclerae anicteric, conjunctiva pink  Ears: Normal auditory acuity Lungs: Clear throughout to auscultation Heart: Regular rate and rhythm Abdomen: Soft, non-distended. No masses, no hepatomegaly. Normal bowel sounds.  Mild epigastric TTP without R/R/G. Musculoskeletal: Symmetrical with no gross deformities  Extremities: No edema  Neurological: Alert oriented x 4, grossly nonfocal Psychological:  Alert and cooperative. Normal mood and affect  Assessment and Recommendations: -GERD and stomach burning:  Will discontinue her omeprazole and give her Nexium 40 mg BID.  Will also add carafate pill four times a day.  She will follow-up with Dr. Jarold Motto in approximately 4 weeks.  I did mention to her about evaluation with ultrasound or other imaging as well, but she preferred to try the medication  change first.

## 2012-12-26 NOTE — Patient Instructions (Addendum)
Nexium has been sent to your pharmacy Discontinue Omeprazole Follow up with Dr Jarold Motto in 4 weeks

## 2013-01-02 ENCOUNTER — Ambulatory Visit (HOSPITAL_COMMUNITY): Payer: Self-pay | Admitting: Licensed Clinical Social Worker

## 2013-01-09 ENCOUNTER — Ambulatory Visit (INDEPENDENT_AMBULATORY_CARE_PROVIDER_SITE_OTHER): Payer: Medicare Other | Admitting: Family Medicine

## 2013-01-09 ENCOUNTER — Ambulatory Visit (HOSPITAL_COMMUNITY): Payer: Self-pay | Admitting: Licensed Clinical Social Worker

## 2013-01-09 ENCOUNTER — Encounter: Payer: Self-pay | Admitting: Family Medicine

## 2013-01-09 VITALS — BP 116/76 | HR 89 | Ht 66.0 in | Wt 170.0 lb

## 2013-01-09 DIAGNOSIS — M25562 Pain in left knee: Secondary | ICD-10-CM | POA: Insufficient documentation

## 2013-01-09 DIAGNOSIS — M25569 Pain in unspecified knee: Secondary | ICD-10-CM

## 2013-01-09 NOTE — Progress Notes (Signed)
S: Pt comes in today for SDA for knee pain.  She reports that she had arthroscopic surgery at age 37 because of locking of her knee-- she reports they found "a growth" and that her scope was emergent because of the locking.  Over the past few weeks, she has started to feel like it is going to lock and she is very anxious about this-- it has not yet locked, but she is scared that it will.  She has not been having any grinding but does have occasional popping.  She does have pain, which is achy and all of the time, worse in the back of her knee.  She has not noticed any redness or swelling in her knee.  Stairs and walking long distances bothers it.  She has tried tylenol, which does not help.    She declines injection today due to fear of needles and NSAIDs due to bad GERD.    ROS: Per HPI  History  Smoking status  . Current Every Day Smoker -- 1.00 packs/day for 22 years  . Types: Cigarettes  Smokeless tobacco  . Never Used    Comment: form given 10-27-11/patches are not covered under insurance    O:  Filed Vitals:   01/09/13 1342  BP: 116/76  Pulse: 89    Gen: NAD L Knee: no obvious deformity, full ROM without pain, no crepitius, patella tracks midline, no swelling or edema, no midline tenderness, no posterior or anterior tenderness, no laxity    A/P: 37 y.o. female p/w L knee pain -See problem list -f/u in PRN

## 2013-01-09 NOTE — Assessment & Plan Note (Signed)
H/o arthroscopic surgery at age 37, will refer to ortho.  Will start PT.  She declines injection today due to fear of needles and NSAIDs due to bad GERD.

## 2013-01-09 NOTE — Patient Instructions (Signed)
It was good to see you today.  For your knee, we will start physical therapy and send you to an orthopedic doctor to have it looked at again.  You can use heat or ice-- whichever makes it feel better.  Come back if your knee gets red, swollen, or you feel like you are getting worse.

## 2013-01-11 ENCOUNTER — Ambulatory Visit (HOSPITAL_COMMUNITY): Payer: Self-pay | Admitting: Licensed Clinical Social Worker

## 2013-01-14 ENCOUNTER — Encounter (HOSPITAL_COMMUNITY): Payer: Self-pay | Admitting: Emergency Medicine

## 2013-01-14 ENCOUNTER — Emergency Department (HOSPITAL_COMMUNITY)
Admission: EM | Admit: 2013-01-14 | Discharge: 2013-01-14 | Disposition: A | Discharge status: Home / Self Care | Payer: Medicare Other | Attending: Emergency Medicine | Admitting: Emergency Medicine

## 2013-01-14 DIAGNOSIS — I10 Essential (primary) hypertension: Secondary | ICD-10-CM | POA: Insufficient documentation

## 2013-01-14 DIAGNOSIS — Z8719 Personal history of other diseases of the digestive system: Secondary | ICD-10-CM | POA: Insufficient documentation

## 2013-01-14 DIAGNOSIS — S239XXA Sprain of unspecified parts of thorax, initial encounter: Secondary | ICD-10-CM | POA: Insufficient documentation

## 2013-01-14 DIAGNOSIS — Z87442 Personal history of urinary calculi: Secondary | ICD-10-CM | POA: Insufficient documentation

## 2013-01-14 DIAGNOSIS — F17200 Nicotine dependence, unspecified, uncomplicated: Secondary | ICD-10-CM | POA: Insufficient documentation

## 2013-01-14 DIAGNOSIS — S139XXA Sprain of joints and ligaments of unspecified parts of neck, initial encounter: Secondary | ICD-10-CM | POA: Diagnosis not present

## 2013-01-14 DIAGNOSIS — Y998 Other external cause status: Secondary | ICD-10-CM | POA: Insufficient documentation

## 2013-01-14 DIAGNOSIS — Z888 Allergy status to other drugs, medicaments and biological substances status: Secondary | ICD-10-CM | POA: Insufficient documentation

## 2013-01-14 DIAGNOSIS — F319 Bipolar disorder, unspecified: Secondary | ICD-10-CM | POA: Insufficient documentation

## 2013-01-14 DIAGNOSIS — F1910 Other psychoactive substance abuse, uncomplicated: Secondary | ICD-10-CM | POA: Insufficient documentation

## 2013-01-14 DIAGNOSIS — R51 Headache: Secondary | ICD-10-CM | POA: Diagnosis not present

## 2013-01-14 DIAGNOSIS — Y92410 Unspecified street and highway as the place of occurrence of the external cause: Secondary | ICD-10-CM | POA: Insufficient documentation

## 2013-01-14 DIAGNOSIS — S161XXA Strain of muscle, fascia and tendon at neck level, initial encounter: Secondary | ICD-10-CM

## 2013-01-14 DIAGNOSIS — Z8619 Personal history of other infectious and parasitic diseases: Secondary | ICD-10-CM | POA: Insufficient documentation

## 2013-01-14 DIAGNOSIS — K5900 Constipation, unspecified: Secondary | ICD-10-CM | POA: Insufficient documentation

## 2013-01-14 DIAGNOSIS — F1020 Alcohol dependence, uncomplicated: Secondary | ICD-10-CM | POA: Insufficient documentation

## 2013-01-14 DIAGNOSIS — K219 Gastro-esophageal reflux disease without esophagitis: Secondary | ICD-10-CM | POA: Insufficient documentation

## 2013-01-14 DIAGNOSIS — Z79899 Other long term (current) drug therapy: Secondary | ICD-10-CM | POA: Insufficient documentation

## 2013-01-14 DIAGNOSIS — Z8742 Personal history of other diseases of the female genital tract: Secondary | ICD-10-CM | POA: Insufficient documentation

## 2013-01-14 DIAGNOSIS — F411 Generalized anxiety disorder: Secondary | ICD-10-CM | POA: Insufficient documentation

## 2013-01-14 DIAGNOSIS — S29012A Strain of muscle and tendon of back wall of thorax, initial encounter: Secondary | ICD-10-CM

## 2013-01-14 MED ORDER — IBUPROFEN 800 MG PO TABS: 800.0000 mg | ORAL_TABLET | Freq: Three times a day (TID) | ORAL | Status: DC

## 2013-01-14 MED ORDER — ACETAMINOPHEN 500 MG PO TABS: 500.0000 mg | ORAL_TABLET | Freq: Four times a day (QID) | ORAL | Status: DC | PRN

## 2013-01-14 MED ORDER — ACETAMINOPHEN 500 MG PO TABS
1000.0000 mg | ORAL_TABLET | Freq: Once | ORAL | Status: AC
  Administered 2013-01-14: 1000 mg via ORAL
  Filled 2013-01-14: qty 2
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
## 2013-01-15 ENCOUNTER — Telehealth: Payer: Self-pay | Admitting: Family Medicine
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
## 2013-01-15 NOTE — ED Provider Notes (Signed)
Medical screening examination/treatment/procedure(s) were performed by non-physician practitioner and as supervising physician I was immediately available for consultation/collaboration.   Richardean Canal, MD 01/15/13 1155

## 2013-01-16 ENCOUNTER — Ambulatory Visit (INDEPENDENT_AMBULATORY_CARE_PROVIDER_SITE_OTHER): Payer: Medicare Other | Admitting: Licensed Clinical Social Worker

## 2013-01-16 DIAGNOSIS — F319 Bipolar disorder, unspecified: Secondary | ICD-10-CM | POA: Diagnosis not present
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
## 2013-01-21 ENCOUNTER — Ambulatory Visit: Payer: Medicare Other | Admitting: Physical Therapy

## 2013-02-01 DIAGNOSIS — M25569 Pain in unspecified knee: Secondary | ICD-10-CM | POA: Diagnosis not present

## 2013-02-06 ENCOUNTER — Ambulatory Visit (INDEPENDENT_AMBULATORY_CARE_PROVIDER_SITE_OTHER): Payer: Medicare Other | Admitting: Licensed Clinical Social Worker

## 2013-02-06 DIAGNOSIS — F319 Bipolar disorder, unspecified: Secondary | ICD-10-CM
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
## 2013-02-08 DIAGNOSIS — M25569 Pain in unspecified knee: Secondary | ICD-10-CM | POA: Diagnosis not present

## 2013-02-09 ENCOUNTER — Other Ambulatory Visit (HOSPITAL_COMMUNITY): Payer: Self-pay | Admitting: Psychiatry

## 2013-02-11 ENCOUNTER — Ambulatory Visit (HOSPITAL_COMMUNITY): Payer: Self-pay | Admitting: Psychiatry

## 2013-02-13 ENCOUNTER — Ambulatory Visit (INDEPENDENT_AMBULATORY_CARE_PROVIDER_SITE_OTHER): Payer: Medicare Other | Admitting: Psychiatry

## 2013-02-13 ENCOUNTER — Encounter (HOSPITAL_COMMUNITY): Payer: Self-pay | Admitting: Psychiatry

## 2013-02-13 VITALS — BP 129/75 | HR 95 | Ht 66.0 in | Wt 173.8 lb

## 2013-02-13 DIAGNOSIS — F319 Bipolar disorder, unspecified: Secondary | ICD-10-CM | POA: Diagnosis not present

## 2013-02-13 MED ORDER — FLUOXETINE HCL 10 MG PO CAPS: 10.0000 mg | ORAL_CAPSULE | Freq: Two times a day (BID) | ORAL | Status: DC

## 2013-02-13 MED ORDER — TRAZODONE HCL 150 MG PO TABS: 150.0000 mg | ORAL_TABLET | Freq: Every day | ORAL | Status: DC

## 2013-02-13 MED ORDER — OLANZAPINE 15 MG PO TABS: 15.0000 mg | ORAL_TABLET | Freq: Every day | ORAL | Status: DC

## 2013-02-13 MED ORDER — LORAZEPAM 1 MG PO TABS: ORAL_TABLET | ORAL | Status: DC
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
## 2013-02-15 DIAGNOSIS — M25569 Pain in unspecified knee: Secondary | ICD-10-CM | POA: Diagnosis not present

## 2013-02-20 ENCOUNTER — Ambulatory Visit (INDEPENDENT_AMBULATORY_CARE_PROVIDER_SITE_OTHER): Payer: Medicare Other | Admitting: Licensed Clinical Social Worker

## 2013-02-20 DIAGNOSIS — F319 Bipolar disorder, unspecified: Secondary | ICD-10-CM
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
## 2013-03-06 ENCOUNTER — Ambulatory Visit (INDEPENDENT_AMBULATORY_CARE_PROVIDER_SITE_OTHER): Payer: Medicare Other | Admitting: Licensed Clinical Social Worker

## 2013-03-06 DIAGNOSIS — F319 Bipolar disorder, unspecified: Secondary | ICD-10-CM

## 2013-03-06 NOTE — Progress Notes (Signed)
   THERAPIST PROGRESS NOTE  Session Time: 10:30am-11:20am  Participation Level: Active  Behavioral Response: Well GroomedAlertAnxious and Irritable  Type of Therapy: Individual Therapy  Treatment Goals addressed: Coping  Interventions: CBT, Motivational Interviewing, Strength-based, Supportive and Reframing  Summary: Sandra Miller is a 37 y.o. female who presents with anxious mood and agitated affect. She reports ongoing stress due to school starting. She is taking three classes and continues to work seven days a week. She is tired and finds it difficult to find time for herself. She continues to struggle setting appropriate boundaries with friends and family. She "hates" saying no to people she cares about. She endorses feelings of guilt which she cannot tolerate. Her distress tolerance is low. She continues to experience anxiety when driving, but this is improving slightly. Her sleep is wnl and her appetite is poor. She is fatigued often, but does not eat regularly.    Suicidal/Homicidal: Nowithout intent/plan  Therapist Response: Assessed patients current functioning and reviewed progress. Reviewed coping strategies. Assessed patients safety and assisted in identifying protective factors.  Reviewed crisis plan with patient. Assisted patient with the expression of frustration and axiety. Reviewed patients self care plan. Assessed progress related to self care. Patients self care is fair. Recommend daily exercise, increased socialization and recreation. Used CBT to assist patient with the identification of negative distortions and irrational thoughts. Encouraged patient to verbalize alternative and factual responses which challenge thought distortions. Used motivational interviewing to assist and encourage patient through the change process. Explored patients barriers to change. Reviewed healthy boundaries and assertive communication. Used DBT to practice mindfulness, review distraction list  and improve distress tolerance skills.   Plan: Return again in two weeks.  Diagnosis: Axis I: bi polar    Axis II: No diagnosis    Perrion Diesel, LCSW 03/06/2013

## 2013-03-13 ENCOUNTER — Other Ambulatory Visit (HOSPITAL_COMMUNITY)
Admission: RE | Admit: 2013-03-13 | Discharge: 2013-03-13 | Disposition: A | Discharge status: Home / Self Care | Payer: Medicare Other | Source: Ambulatory Visit | Attending: Emergency Medicine | Admitting: Emergency Medicine

## 2013-03-13 ENCOUNTER — Ambulatory Visit (INDEPENDENT_AMBULATORY_CARE_PROVIDER_SITE_OTHER): Payer: Medicare Other | Admitting: Emergency Medicine

## 2013-03-13 ENCOUNTER — Encounter: Payer: Self-pay | Admitting: Emergency Medicine

## 2013-03-13 ENCOUNTER — Ambulatory Visit (INDEPENDENT_AMBULATORY_CARE_PROVIDER_SITE_OTHER): Payer: Medicare Other | Admitting: Psychiatry

## 2013-03-13 ENCOUNTER — Encounter (HOSPITAL_COMMUNITY): Payer: Self-pay | Admitting: Psychiatry

## 2013-03-13 VITALS — BP 119/76 | HR 80 | Temp 98.4°F | Ht 66.0 in | Wt 172.0 lb

## 2013-03-13 DIAGNOSIS — Z113 Encounter for screening for infections with a predominantly sexual mode of transmission: Secondary | ICD-10-CM | POA: Insufficient documentation

## 2013-03-13 DIAGNOSIS — N760 Acute vaginitis: Secondary | ICD-10-CM | POA: Diagnosis not present

## 2013-03-13 DIAGNOSIS — F319 Bipolar disorder, unspecified: Secondary | ICD-10-CM

## 2013-03-13 DIAGNOSIS — N898 Other specified noninflammatory disorders of vagina: Secondary | ICD-10-CM | POA: Insufficient documentation

## 2013-03-13 DIAGNOSIS — L0292 Furuncle, unspecified: Secondary | ICD-10-CM | POA: Diagnosis not present

## 2013-03-13 DIAGNOSIS — L0293 Carbuncle, unspecified: Secondary | ICD-10-CM

## 2013-03-13 LAB — POCT WET PREP (WET MOUNT)

## 2013-03-13 MED ORDER — OLANZAPINE 15 MG PO TABS: 15.0000 mg | ORAL_TABLET | Freq: Every day | ORAL | Status: DC

## 2013-03-13 MED ORDER — FLUCONAZOLE 150 MG PO TABS: 150.0000 mg | ORAL_TABLET | Freq: Once | ORAL | Status: DC

## 2013-03-13 MED ORDER — METRONIDAZOLE 0.75 % VA GEL: 1.0000 | Freq: Two times a day (BID) | VAGINAL | Status: DC

## 2013-03-13 MED ORDER — TRAZODONE HCL 150 MG PO TABS: 150.0000 mg | ORAL_TABLET | Freq: Every day | ORAL | Status: DC

## 2013-03-13 MED ORDER — LORAZEPAM 1 MG PO TABS: ORAL_TABLET | ORAL | Status: DC

## 2013-03-13 NOTE — Progress Notes (Signed)
[facility] [address] Progress Note  Sandra Miller [id number] 37 y.o.  03/13/2013 4:54 PM  Chief Complaint:   I'm doing better.  History of Present Illness: Patient is 37 year old female who came for her followup appointment.   She is taking trazodone 150 mg at bedtime.  She's also taking Zyprexa as prescribed.  She is less anxious and less depressed.  She still has panic attack when she is driving but overall her intensity and frequency is much improved.  She seeing therapist.  She cut down her Ativan use.  She's taking only as needed.  Her sleep is much improved.  She is more busy at school.  She's trying to keep herself busy.  Her blood pressure is better today.  She denies any irritability anger, mood swing or any crying spells.  She was to continue her current trazodone and Zyprexa. She's not drinking or using any illegal substance however she feet current medicine is not working.   Suicidal Ideation: No Plan Formed: No Patient has means to carry out plan: No  Homicidal Ideation: No Plan Formed: No Patient has means to carry out plan: No  Review of Systems  Gastrointestinal: Positive for heartburn.  Musculoskeletal: Positive for back pain.  Skin: Negative.   Neurological: Positive for headaches.  Psychiatric/Behavioral: Negative for suicidal ideas. The patient is nervous/anxious.     Psychiatric: Agitation: Yes Hallucination: No Depressed Mood: Yes Insomnia: Yes Hypersomnia: No Altered Concentration: No Feels Worthless: Yes Grandiose Ideas: No Belief In Special Powers: No New/Increased Substance Abuse: No Compulsions: No  Neurologic: Headache: No Seizure: No Paresthesias: No  Past history. Patient has history of multiple psychiatric inpatient treatment.  She has history of impulsive behavior aggression and violence paranoia and psychotic symptoms.    Medical history. Patient has history of hypertension , migraine headache and GERD.    Psychosocial  history . Patient lives with her 2 year old son.  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     Outpatient Encounter Prescriptions as of 03/13/2013  Medication Sig Dispense Refill  . acetaminophen (TYLENOL) 500 MG tablet Take 1,000 mg by mouth 2 (two) times daily as needed. For pain      . acetaminophen (TYLENOL) 500 MG tablet Take 1 tablet (500 mg total) by mouth every 6 (six) hours as needed for pain.  30 tablet  0  . fluconazole (DIFLUCAN) 150 MG tablet Take 1 tablet (150 mg total) by mouth once.  1 tablet  0  . FLUoxetine (PROZAC) 10 MG capsule Take 1 capsule (10 mg total) by mouth 2 (two) times daily.  60 capsule  0  . ibuprofen (ADVIL,MOTRIN) 800 MG tablet Take 1 tablet (800 mg total) by mouth 3 (three) times daily.  21 tablet  0  . LORazepam (ATIVAN) 1 MG tablet Take 1 at bed time as needed  30 tablet  0  . metroNIDAZOLE (METROGEL) 0.75 % vaginal gel Place 1 Applicatorful vaginally 2 (two) times daily. For 7 days.  70 g  2  . Multiple Vitamin (MULTIVITAMIN) tablet Take 1 tablet by mouth daily.      Marland Kitchen OLANZapine (ZYPREXA) 15 MG tablet Take 1 tablet (15 mg total) by mouth at bedtime.  30 tablet  1  . pantoprazole (PROTONIX) 40 MG tablet Take 1 tablet (40 mg total) by mouth daily.  30 tablet  3  . traZODone (DESYREL) 150 MG tablet Take 1 tablet (150 mg total) by mouth at bedtime.  30 tablet  1  . VITAMIN E PO Take 3 capsules by mouth daily.      . [DISCONTINUED] LORazepam (ATIVAN) 1 MG tablet Take 1-2 at bed time  45 tablet  0  . [DISCONTINUED] OLANZapine (ZYPREXA) 15 MG tablet Take 1 tablet (15 mg total) by mouth at bedtime.  30 tablet  0  . [DISCONTINUED] traZODone (DESYREL) 150 MG tablet Take 1 tablet (150 mg total) by mouth at bedtime.  30 tablet  0   No facility-administered encounter medications on file as of 03/13/2013.    Past Psychiatric History/Hospitalization(s): Anxiety: Yes Bipolar Disorder: Yes Depression: Yes Mania: Yes Psychosis: Yes Schizophrenia:  No Personality Disorder: No Hospitalization for psychiatric illness: Yes History of Electroconvulsive Shock Therapy: No Prior Suicide Attempts: No  Physical Exam: Constitutional:  There were no vitals taken for this visit.  General Appearance: well nourished, tearful and frustrated.  Musculoskeletal: Strength & Muscle Tone: within normal limits Gait & Station: normal Patient leans: N/A  Psychiatric: Speech (describe rate, volume, coherence, spontaneity, and abnormalities if any): Slow but clear and coherent.  Thought Process (describe rate, content, abstract reasoning, and computation): Slow but logical linear and goal-directed.  Associations: Relevant  Thoughts: normal  Mental Status: Orientation: oriented to person, place, time/date and situation Mood & Affect: depressed affect Attention Span & Concentration: Fair  Medical Decision Making (Choose Three): Established Problem, Stable/Improving (1), Review of Last Therapy Session (1) and Review of Medication Regimen & Side Effects (2)  Assessment: Axis I: Bipolar disorder, NOS  Axis II: Deferred  Axis III:  Patient Active Problem List   Diagnosis Date Noted  . Vaginal discharge 03/13/2013  . Left knee pain 01/09/2013  . GERD (gastroesophageal reflux disease) 12/26/2012  . Stomach burning 12/26/2012  . Right wrist pain 11/16/2012  . Recurrent boils 09/14/2012  . Tobacco abuse 08/22/2012  . Esophageal dysphagia 10/28/2011  . HELICOBACTER PYLORI GASTRITIS, HX OF 03/20/2009  . HYPERTENSION NEC 03/19/2009  . NEPHROLITHIASIS, HX OF 03/19/2009  . HIATAL HERNIA WITH REFLUX 03/16/2009  . HSV 02/03/2007  . DISORDER, BIPOLAR NOS 02/03/2007  . HX, PERSONAL, GENITAL/OBSTETRIC DISORDER NEC 02/03/2007    Axis IV: Moderate  Axis V: 60-65   Plan:  I will continue trazodone 150 mg at bedtime, Zyprexa 50 mg at bedtime and Prozac 10 mg twice a day.  We will cut down her Ativan to 1 a day as needed.  Recommend to call us  back if she has any question or concern.  Recommend to see therapist for coping and social skills.  I will see her again in 2 months.  Myrth Dahan T., MD 03/13/2013
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
## 2013-03-13 NOTE — Assessment & Plan Note (Addendum)
BV and yeast on wet prep.  GC/Chlamydia sent.  Will treat with metrogel.  Diflucan after completing course of metrogel.  Follow up in 1 week, if not improved.  If has recurrence of menstrual cycle that is associated with these infections and mood fluctuation, may be beneficial for her to see the GYN again to discuss repeat ablation.

## 2013-03-13 NOTE — Assessment & Plan Note (Signed)
Active lesion on vulva.  Recommended hot compresses BID.

## 2013-03-13 NOTE — Patient Instructions (Addendum)
It was nice to meet you! You have BV and a yeast infection. Please complete the Metrogel, then take the diflucan. I will call with the results of your other tests. Follow up in 1 week, if symptoms not improving.

## 2013-03-15 ENCOUNTER — Telehealth: Payer: Self-pay | Admitting: Emergency Medicine

## 2013-03-15 NOTE — Telephone Encounter (Signed)
Called and informed patient of negative GC/Chlamydia results.

## 2013-03-20 ENCOUNTER — Ambulatory Visit (INDEPENDENT_AMBULATORY_CARE_PROVIDER_SITE_OTHER): Payer: Medicare Other | Admitting: Licensed Clinical Social Worker

## 2013-03-20 DIAGNOSIS — F319 Bipolar disorder, unspecified: Secondary | ICD-10-CM

## 2013-03-20 NOTE — Progress Notes (Signed)
   THERAPIST PROGRESS NOTE  Session Time: 10:30am-11:20am  Participation Level: Active  Behavioral Response: Well GroomedAlertAnxious, Depressed and Irritable  Type of Therapy: Individual Therapy  Treatment Goals addressed: Coping  Interventions: CBT, DBT, Motivational Interviewing, Solution Focused, Strength-based, Supportive and Reframing  Summary: Sandra Miller is a 37 y.o. female who presents with depressed mood and tearful affect. She reports an increase in feelings of depression, with frequent crying spells, anxiety and agitation. She endorses being overwhelmed with work and school. She feels pressured because her rent has been increased and she has taken on another work assignment for next week, which she realizes she cannot do emotionally.  She has difficulty putting her needs first and recognizing solutions to her problems. She remains sober, but is fearful that due to increased stress she will use again. She endorses increased impulsivity in her speech and describes cursing out loud in school due to agitation. She does not feel that she has her agitation under control. Her sleep is wnl and her appetite is poor.   Suicidal/Homicidal: Nowithout intent/plan  Therapist Response: Assessed patients current functioning and reviewed progress. Reviewed coping strategies. Assessed patients safety and assisted in identifying protective factors.  Reviewed crisis plan with patient. Assisted patient with the expression of fear and anger. Reviewed patients self care plan. Assessed progress related to self care. Patients self care is fair. Recommend daily exercise, increased socialization and recreation. Reviewed healthy boundaries and assertive communication. Used CBT to assist patient with the identification of negative distortions and irrational thoughts. Encouraged patient to verbalize alternative and factual responses which challenge thought distortions. Used DBT to practice mindfulness, review  distraction list and improve distress tolerance skills. Used motivational interviewing to assist and encourage patient through the change process. Explored patients barriers to change.   Plan: Return again in two weeks.  Diagnosis: Axis I: Bipolar, mixed    Axis II: No diagnosis    Taria Castrillo, LCSW 03/20/2013

## 2013-03-25 ENCOUNTER — Other Ambulatory Visit (HOSPITAL_COMMUNITY): Payer: Self-pay | Admitting: Psychiatry

## 2013-03-27 ENCOUNTER — Other Ambulatory Visit (HOSPITAL_COMMUNITY): Payer: Self-pay | Admitting: Psychiatry

## 2013-04-03 ENCOUNTER — Ambulatory Visit (INDEPENDENT_AMBULATORY_CARE_PROVIDER_SITE_OTHER): Payer: Medicare Other | Admitting: Licensed Clinical Social Worker

## 2013-04-03 DIAGNOSIS — F319 Bipolar disorder, unspecified: Secondary | ICD-10-CM | POA: Diagnosis not present

## 2013-04-03 NOTE — Progress Notes (Signed)
   THERAPIST PROGRESS NOTE  Session Time: 10:30am-11:20am  Participation Level: Active  Behavioral Response: Well GroomedAlertAnxious, Depressed and Irritable  Type of Therapy: Individual Therapy  Treatment Goals addressed: Coping  Interventions: CBT, DBT, Strength-based, Supportive and Reframing  Summary: Sandra Miller is a 37 y.o. female who presents with depressed mood and anxious affect. She is very agitated and tearful throughout the entire session. 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 Patient expresses concern that she will become so agitated, that she will harm someone. She reports pacing for thirty minutes outside school to calm herself down. She is anxious about money because the family she has been working for no longer need her. She is grieving the loss of her care receiver because she is moving into a nursing home. She is anxious about having enough money to pay her bills. She wants new medication or an adjustment in her medication. She has not been taking the whole does of Zyprexa due to weight gain and reports that she is unwilling to take 15mg . She is able to commit to safety and does not meet criteria for inpatient admission. Her sleep and appetite are both disrupted and poor.    Suicidal/Homicidal: Nowithout intent/plan  Therapist Response: Assessed patients current functioning and reviewed progress. Reviewed coping strategies. Assessed patients safety and assisted in identifying protective factors.  Reviewed crisis plan with patient. Assisted patient with the expression of anxiety and anger. Reviewed patients self care plan. Assessed progress related to self care. Patients self care is poor. Recommend daily exercise, increased socialization and recreation. Used CBT to assist patient with the identification of negative distortions and irrational thoughts. Encouraged patient to verbalize alternative and factual  responses which challenge thought distortions. Used DBT to practice mindfulness, review distraction list and improve distress tolerance skills. Used motivational interviewing to assist and encourage patient through the change process. Explored patients barriers to change. This Clinical research associate contacted Dr. Lolly Mustache via epic requesting he contact patient about medication alternative since her next appointment with him is not until Sept 10th. Recommend patient see if she can get an earlier appointment.    Plan: Return again in one weeks.  Diagnosis: Axis I: Bipolar, mixed    Axis II: No diagnosis    Zakyia Gagan, LCSW 04/03/2013

## 2013-04-16 ENCOUNTER — Encounter (HOSPITAL_COMMUNITY): Payer: Self-pay | Admitting: Psychiatry

## 2013-04-16 ENCOUNTER — Ambulatory Visit (INDEPENDENT_AMBULATORY_CARE_PROVIDER_SITE_OTHER): Payer: Medicare Other | Admitting: Psychiatry

## 2013-04-16 VITALS — BP 122/70 | HR 84 | Ht 66.0 in | Wt 171.2 lb

## 2013-04-16 DIAGNOSIS — F319 Bipolar disorder, unspecified: Secondary | ICD-10-CM | POA: Diagnosis not present

## 2013-04-16 MED ORDER — TRAZODONE HCL 150 MG PO TABS: 150.0000 mg | ORAL_TABLET | Freq: Every day | ORAL | Status: DC

## 2013-04-16 MED ORDER — FLUOXETINE HCL 10 MG PO CAPS: 10.0000 mg | ORAL_CAPSULE | Freq: Two times a day (BID) | ORAL | Status: DC

## 2013-04-16 MED ORDER — TOPIRAMATE 25 MG PO TABS: ORAL_TABLET | ORAL | Status: DC

## 2013-04-16 MED ORDER — OLANZAPINE 20 MG PO TABS: 20.0000 mg | ORAL_TABLET | Freq: Every day | ORAL | Status: DC

## 2013-04-16 NOTE — Progress Notes (Signed)
[facility] [id number] Progress Note  Sandra Miller [id number] 37 y.o.  04/16/2013 4:48 PM  Chief Complaint  Patient presents with  . Headache  . Follow-up  . Fatigue  . Medication Refill  . Stress    History of Present Illness: Patient is 37 year old female who came for her followup appointment.   Patient was doing better until recently she started to feel very depressed irritable and angry.  Patient endorsed financial distress .  She is working however she wants to quit her work because of the stress coming from the work.  She does not want to quit because she needs money.  She admitted irritability and anger and sometime poor sleep.  She is not taking Ativan.  She admitted to crying spells and mood swings but denies any active or passive suicidal thoughts.  She is seeing therapist regularly.  She is not drinking or using any illegal substances.  She admitted headaches which she believed her to stressed related.   Suicidal Ideation: No Plan Formed: No Patient has means to carry out plan: No  Homicidal Ideation: No Plan Formed: No Patient has means to carry out plan: No  Review of Systems  Constitutional: Positive for malaise/fatigue.  Gastrointestinal: Positive for heartburn.  Musculoskeletal: Positive for back pain.  Skin: Negative.   Neurological: Positive for headaches.  Psychiatric/Behavioral: Negative for suicidal ideas. The patient is nervous/anxious and has insomnia.     Psychiatric: Agitation: Yes Hallucination: No Depressed Mood: Yes Insomnia: Yes Hypersomnia: No Altered Concentration: No Feels Worthless: Yes Grandiose Ideas: No Belief In Special Powers: No New/Increased Substance Abuse: No Compulsions: No  Neurologic: Headache: No Seizure: No Paresthesias: No  Past history. Patient has history of multiple psychiatric inpatient treatment.  She has history of impulsive behavior aggression and violence paranoia and psychotic symptoms.     Medical history. Patient has history of hypertension , migraine headache and GERD.    Psychosocial history . Patient lives with her 60 year old son.  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     Outpatient Encounter Prescriptions as of 04/16/2013  Medication Sig Dispense Refill  . acetaminophen (TYLENOL) 500 MG tablet Take 1,000 mg by mouth 2 (two) times daily as needed. For pain      . acetaminophen (TYLENOL) 500 MG tablet Take 1 tablet (500 mg total) by mouth every 6 (six) hours as needed for pain.  30 tablet  0  . fluconazole (DIFLUCAN) 150 MG tablet Take 1 tablet (150 mg total) by mouth once.  1 tablet  0  . FLUoxetine (PROZAC) 10 MG capsule Take 1 capsule (10 mg total) by mouth 2 (two) times daily.  60 capsule  0  . ibuprofen (ADVIL,MOTRIN) 800 MG tablet Take 1 tablet (800 mg total) by mouth 3 (three) times daily.  21 tablet  0  . LORazepam (ATIVAN) 1 MG tablet Take 1 at bed time as needed  30 tablet  0  . metroNIDAZOLE (METROGEL) 0.75 % vaginal gel Place 1 Applicatorful vaginally 2 (two) times daily. For 7 days.  70 g  2  . Multiple Vitamin (MULTIVITAMIN) tablet Take 1 tablet by mouth daily.      Marland Kitchen OLANZapine (ZYPREXA) 20 MG tablet Take 1 tablet (20 mg total) by mouth at bedtime.  30 tablet  0  . pantoprazole (PROTONIX) 40 MG tablet Take 1 tablet (40 mg total) by mouth daily.  30 tablet  3  . traZODone (DESYREL) 150 MG tablet Take  1 tablet (150 mg total) by mouth at bedtime.  30 tablet  0  . VITAMIN E PO Take 3 capsules by mouth daily.      . [DISCONTINUED] FLUoxetine (PROZAC) 10 MG capsule Take 1 capsule (10 mg total) by mouth 2 (two) times daily.  60 capsule  0  . [DISCONTINUED] OLANZapine (ZYPREXA) 15 MG tablet Take 1 tablet (15 mg total) by mouth at bedtime.  30 tablet  1  . [DISCONTINUED] traZODone (DESYREL) 150 MG tablet Take 1 tablet (150 mg total) by mouth at bedtime.  30 tablet  1  . topiramate (TOPAMAX) 25 MG tablet Take 1-2 tab at bed time  45 tablet  0    No facility-administered encounter medications on file as of 04/16/2013.    Past Psychiatric History/Hospitalization(s): Patient has been seen in this office since 2008 on and off.  She has history of bipolar disorder, alcohol dependence and marijuana abuse.  She claims to be sober for past 16 months from drugs and alcohol.  She has history of agitation anger on aggressive behavior.  Patient has multiple hospitalization.  Her first psychiatric hospitalization was at age 37.  She is at [facility] 6 psychiatric hospitalization.  [facility].  In the past she had tried lithium, Seroquel, Lamictal, Paxil, Celexa, Depakote, Neurontin and Cymbalta with limited response.  Patient is very reluctant to take any medication that cause weight gain.  Patient denies any history of suicidal attempt but admitted history of aggression and violence. Anxiety: Yes Bipolar Disorder: Yes Depression: Yes Mania: Yes Psychosis: Yes Schizophrenia: No Personality Disorder: No Hospitalization for psychiatric illness: Yes History of Electroconvulsive Shock Therapy: No Prior Suicide Attempts: No  Physical Exam: Constitutional:  BP 122/70  Pulse 84  Ht 5\' 6"  (1.676 m)  Wt 171 lb 3.2 oz (77.656 kg)  BMI 27.65 kg/m2  Recent Results (from the past 2160 hour(s))  POCT WET PREP (WET MOUNT)     Status: None   Collection Time    03/13/13 10:17 AM      Result Value Range   Source Wet Prep POC       WBC, Wet Prep HPF POC 5-10     Bacteria Wet Prep HPF POC many cocci     BACTERIA WET PREP MORPHOLOGY POC       Clue Cells Wet Prep HPF POC Many     CLUE CELLS WET PREP WHIFF POC Positive Whiff     Yeast Wet Prep HPF POC Few     KOH Wet Prep POC       Trichomonas Wet Prep HPF POC none      General Appearance: well nourished, tearful and frustrated.  Musculoskeletal: Strength & Muscle Tone: within normal limits Gait & Station: normal Patient leans:  N/A  Psychiatric: Speech (describe rate, volume, coherence, spontaneity, and abnormalities if any): Slow but clear and coherent.  Thought Process (describe rate, content, abstract reasoning, and computation): Slow but logical linear and goal-directed.  Associations: Relevant  Thoughts: normal  Mental Status: Orientation: oriented to person, place, time/date and situation Mood & Affect: depressed affect Attention Span & Concentration: Fair  Medical Decision Making (Choose Three): Review of Psycho-Social Stressors (1), Review and summation of old records (2), Established Problem, Worsening (2), Review of Last Therapy Session (1), Review of Medication Regimen & Side Effects (2) and Review of New Medication or Change in Dosage (2)  Assessment: Axis I: Bipolar disorder, NOS  Axis II: Deferred  Axis III:  Patient Active Problem List   Diagnosis Date Noted  . Vaginal discharge 03/13/2013  . Left knee pain 01/09/2013  . GERD (gastroesophageal reflux disease) 12/26/2012  . Stomach burning 12/26/2012  . Right wrist pain 11/16/2012  . Recurrent boils 09/14/2012  . Tobacco abuse 08/22/2012  . Esophageal dysphagia 10/28/2011  . HELICOBACTER PYLORI GASTRITIS, HX OF 03/20/2009  . HYPERTENSION NEC 03/19/2009  . NEPHROLITHIASIS, HX OF 03/19/2009  . HIATAL HERNIA WITH REFLUX 03/16/2009  . HSV 02/03/2007  . DISORDER, BIPOLAR NOS 02/03/2007  . HX, PERSONAL, GENITAL/OBSTETRIC DISORDER NEC 02/03/2007    Axis IV: Moderate  Axis V: 60-65   Plan:  I review her old chart and reviewed the medication.  In the past she had tried Depakote, Abilify, lithium, Cymbalta, Neurontin, Celexa, Paxil and Seroquel.  She is reluctant to take any medication that cause weight gain however she had good response with Zyprexa.  After some encouragement she agreed to increase her Zyprexa to help for mood swings and anger.  We will add Topamax 25 mg 1-2 tablet that can help her headache and some weight loss.   Patient has tried Topamax in the past with good response.  I recommended to call us back if she has any questions or concerns.  She is not taking Ativan every day.  She admitted to increased stress related to job however she wants to continue working because she needs money.  I recommended to see therapist regularly.  Followup in 3 weeks.  Recommended to call us back if she has any question. Time spent 25 minutes.  More than 50% of the time spent in psychoeducation, counseling and coordination of care.  Discuss safety plan that anytime having active suicidal thoughts or homicidal thoughts then patient need to call 911 or go to the local emergency room.  Ariellah Faust T., MD 04/16/2013

## 2013-04-22 ENCOUNTER — Ambulatory Visit (INDEPENDENT_AMBULATORY_CARE_PROVIDER_SITE_OTHER): Payer: Medicare Other | Admitting: Family Medicine

## 2013-04-22 ENCOUNTER — Encounter: Payer: Self-pay | Admitting: Family Medicine

## 2013-04-22 ENCOUNTER — Ambulatory Visit (HOSPITAL_COMMUNITY)
Admission: RE | Admit: 2013-04-22 | Discharge: 2013-04-22 | Disposition: A | Discharge status: Home / Self Care | Payer: Medicare Other | Source: Ambulatory Visit | Attending: Family Medicine | Admitting: Family Medicine

## 2013-04-22 VITALS — BP 130/76 | HR 88 | Ht 66.0 in | Wt 171.0 lb

## 2013-04-22 DIAGNOSIS — R079 Chest pain, unspecified: Secondary | ICD-10-CM

## 2013-04-22 DIAGNOSIS — R0789 Other chest pain: Secondary | ICD-10-CM | POA: Insufficient documentation

## 2013-04-22 NOTE — Assessment & Plan Note (Signed)
Atypical chest pain likely related to recent stress, EKG performed in office did not show any evidence of acute ischemia -Patient was counseled that her symptoms are most likely related to stress -Right flag symptoms given to patient for which she would contact the office or go to the ER immediately

## 2013-04-22 NOTE — Patient Instructions (Addendum)
Your symptoms are most likely related to stress. Your heart exam and EKG were normal today.  Please return to the office if your symptoms do not improve with stress. Please follow up with your counselor and psychiatrist as scheduled.  Call our office/go to the ER immediately if you have thoughts of harming yourself or others.

## 2013-04-22 NOTE — Progress Notes (Signed)
  Subjective:    Patient ID: Sandra Miller, female    DOB: 1976/05/21, 37 y.o.   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, this occurred 2 weeks ago and coincides with the initiation of her chest pain, 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, site, she was recently seen by her psychiatrist who adjusted her psychiatric medications, she seemed a regular basis by both her counselor and psychiatrist, she did think that her chest pain is related to stress and recently traveled to [facility], she was in the car for approximately 3 hours, denies associated shortness of breath or calf pain, this trip did not help to relieve her stress or to relieve her chest discomfort  She has previously been seen by cardiology for history of elevated blood pressure, per patient this is much improved from previous  Family history: Her mother had a history of heart disease and hypertension, patient thinks this is an older age however is uncertain   Review of Systems  Constitutional: Negative for fever, chills and fatigue.  Respiratory: Negative for cough and shortness of breath.   Cardiovascular: Positive for chest pain. Negative for palpitations.  Gastrointestinal: Positive for nausea. Negative for vomiting, diarrhea and constipation.       Objective:   Physical Exam Vitals: Reviewed Cardiac: Regular rate and rhythm, S1 and S2 present,  no murmurs, gallops, no rubs, no heaves or thrills, pain under left breast is not reproducible Respiratory: Good effort, clear to auscultation bilaterally Abdomen: Soft, bowel sounds present in all 4 quadrants, nontender, when pressing on her abdomen she states that this recreates the pain in her left breast Psychiatric: Patient is tearful, appropriate dressed, denies homicidal or suicidal thoughts  Extremities: No calf pain, no lower extremity edema  EKG: Rate of 65, normal sinus rhythm, no acute ischemic changes      Assessment & Plan:  Please see problem specific assessment and plan

## 2013-05-01 ENCOUNTER — Ambulatory Visit (HOSPITAL_COMMUNITY): Payer: Self-pay | Admitting: Licensed Clinical Social Worker

## 2013-05-03 ENCOUNTER — Encounter: Payer: Medicare Other | Admitting: *Deleted

## 2013-05-07 ENCOUNTER — Ambulatory Visit (INDEPENDENT_AMBULATORY_CARE_PROVIDER_SITE_OTHER): Payer: Medicare Other | Admitting: *Deleted

## 2013-05-07 ENCOUNTER — Encounter (HOSPITAL_COMMUNITY): Payer: Self-pay | Admitting: Psychiatry

## 2013-05-07 ENCOUNTER — Ambulatory Visit (INDEPENDENT_AMBULATORY_CARE_PROVIDER_SITE_OTHER): Payer: Medicare Other | Admitting: Psychiatry

## 2013-05-07 ENCOUNTER — Encounter (HOSPITAL_COMMUNITY): Payer: Self-pay

## 2013-05-07 VITALS — BP 125/72 | HR 83 | Ht 66.0 in | Wt 177.8 lb

## 2013-05-07 DIAGNOSIS — Z111 Encounter for screening for respiratory tuberculosis: Secondary | ICD-10-CM

## 2013-05-07 DIAGNOSIS — F319 Bipolar disorder, unspecified: Secondary | ICD-10-CM

## 2013-05-07 MED ORDER — TOPIRAMATE 25 MG PO TABS: ORAL_TABLET | ORAL | Status: DC

## 2013-05-07 MED ORDER — LORAZEPAM 1 MG PO TABS: ORAL_TABLET | ORAL | Status: DC

## 2013-05-07 MED ORDER — OLANZAPINE 20 MG PO TABS: 20.0000 mg | ORAL_TABLET | Freq: Every day | ORAL | Status: DC

## 2013-05-07 MED ORDER — FLUOXETINE HCL 20 MG PO CAPS: 20.0000 mg | ORAL_CAPSULE | Freq: Every day | ORAL | Status: DC

## 2013-05-07 MED ORDER — TRAZODONE HCL 150 MG PO TABS: 150.0000 mg | ORAL_TABLET | Freq: Every day | ORAL | Status: DC

## 2013-05-07 NOTE — Progress Notes (Signed)
[facility] [id number] Progress Note  Sandra Miller [id number] 37 y.o.  05/07/2013 4:39 PM  Chief Complaint  Patient presents with  . Follow-up  . Medication Refill    History of Present Illness: Patient is 37 year old female who came for her followup appointment.   Patient is doing better on increase Zyprexa and Topamax.  She is also taking Prozac 20 mg daily.  She does not require Ativan every day however she does require at least 3-4 times a week.  Overall increase Zyprexa helping her irritability anger and mood swings.  She likes to relax however she required 2 tablet every night.  Her headache is much resolved.  She is more calm and relaxed.  She is sleeping better.  She is watching her diet and pleased that the Topamax that she is normal craving for sugars.  It is also helping her headaches.  The patient like to continue her current psychiatric medication.  She is working and now her client is back and patient feels that she is back to her baseline.  She is seeing therapist however recently she switched to see Belenda Cruise every other week.  Patient denies any tremors or shakes.   Suicidal Ideation: No Plan Formed: No Patient has means to carry out plan: No  Homicidal Ideation: No Plan Formed: No Patient has means to carry out plan: No  Review of Systems  Constitutional: Negative.   HENT: Negative.   Musculoskeletal: Positive for back pain.  Skin: Negative.   Psychiatric/Behavioral: Negative.  Negative for suicidal ideas.    Psychiatric: Agitation: Yes Hallucination: No Depressed Mood: Yes Insomnia: Yes Hypersomnia: No Altered Concentration: No Feels Worthless: Yes Grandiose Ideas: No Belief In Special Powers: No New/Increased Substance Abuse: No Compulsions: No  Neurologic: Headache: No Seizure: No Paresthesias: No  Past history. Patient has history of multiple psychiatric inpatient treatment.  She has history of impulsive behavior aggression and violence  paranoia and psychotic symptoms.    Medical history. Patient has history of hypertension , migraine headache and GERD.    Psychosocial history . Patient lives with her 47 year old son.  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     Outpatient Encounter Prescriptions as of 05/07/2013  Medication Sig Dispense Refill  . FLUoxetine (PROZAC) 20 MG capsule Take 1 capsule (20 mg total) by mouth daily.  30 capsule  1  . LORazepam (ATIVAN) 1 MG tablet Take 1 at bed time as needed  30 tablet  0  . metroNIDAZOLE (METROGEL) 0.75 % vaginal gel Place 1 Applicatorful vaginally 2 (two) times daily. For 7 days.  70 g  2  . Multiple Vitamin (MULTIVITAMIN) tablet Take 1 tablet by mouth daily.      Marland Kitchen OLANZapine (ZYPREXA) 20 MG tablet Take 1 tablet (20 mg total) by mouth at bedtime.  30 tablet  1  . topiramate (TOPAMAX) 25 MG tablet Take 2 tab at bed time  60 tablet  1  . traZODone (DESYREL) 150 MG tablet Take 1 tablet (150 mg total) by mouth at bedtime.  30 tablet  1  . VITAMIN E PO Take 3 capsules by mouth daily.      . [DISCONTINUED] acetaminophen (TYLENOL) 500 MG tablet Take 1,000 mg by mouth 2 (two) times daily as needed. For pain      . [DISCONTINUED] acetaminophen (TYLENOL) 500 MG tablet Take 1 tablet (500 mg total) by mouth every 6 (six) hours as needed for pain.  30 tablet  0  . [DISCONTINUED] fluconazole (DIFLUCAN) 150 MG tablet Take 1 tablet (150 mg total) by mouth once.  1 tablet  0  . [DISCONTINUED] FLUoxetine (PROZAC) 10 MG capsule Take 1 capsule (10 mg total) by mouth 2 (two) times daily.  60 capsule  0  . [DISCONTINUED] LORazepam (ATIVAN) 1 MG tablet Take 1 at bed time as needed  30 tablet  0  . [DISCONTINUED] OLANZapine (ZYPREXA) 20 MG tablet Take 1 tablet (20 mg total) by mouth at bedtime.  30 tablet  0  . [DISCONTINUED] topiramate (TOPAMAX) 25 MG tablet Take 1-2 tab at bed time  45 tablet  0  . [DISCONTINUED] traZODone (DESYREL) 150 MG tablet Take 1 tablet (150 mg total) by  mouth at bedtime.  30 tablet  0   No facility-administered encounter medications on file as of 05/07/2013.    Past Psychiatric History/Hospitalization(s): Patient has been seen in this office since 2008 on and off.  She has history of bipolar disorder, alcohol dependence and marijuana abuse.  She claims to be sober for past 16 months from drugs and alcohol.  She has history of agitation anger on aggressive behavior.  Patient has multiple hospitalization.  Her first psychiatric hospitalization was at age 43.  She is at [facility] psychiatric hospitalization.  She's [facility].  In the past she had tried lithium, Seroquel, Lamictal, Paxil, Celexa, Depakote, Neurontin and Cymbalta with limited response.  Patient is very reluctant to take any medication that cause weight gain.  Patient denies any history of suicidal attempt but admitted history of aggression and violence. Anxiety: Yes Bipolar Disorder: Yes Depression: Yes Mania: Yes Psychosis: Yes Schizophrenia: No Personality Disorder: No Hospitalization for psychiatric illness: Yes History of Electroconvulsive Shock Therapy: No Prior Suicide Attempts: No  Physical Exam: Constitutional:  BP 125/72  Pulse 83  Ht 5\' 6"  (1.676 m)  Wt 177 lb 12.8 oz (80.65 kg)  BMI 28.71 kg/m2  Recent Results (from the past 2160 hour(s))  POCT WET PREP (WET MOUNT)     Status: None   Collection Time    03/13/13 10:17 AM      Result Value Range   Source Wet Prep POC       WBC, Wet Prep HPF POC 5-10     Bacteria Wet Prep HPF POC many cocci     BACTERIA WET PREP MORPHOLOGY POC       Clue Cells Wet Prep HPF POC Many     CLUE CELLS WET PREP WHIFF POC Positive Whiff     Yeast Wet Prep HPF POC Few     KOH Wet Prep POC       Trichomonas Wet Prep HPF POC none      General Appearance: well nourished, tearful and frustrated.  Musculoskeletal: Strength & Muscle Tone: within normal limits Gait & Station:  normal Patient leans: N/A  Psychiatric: Speech (describe rate, volume, coherence, spontaneity, and abnormalities if any): Slow but clear and coherent.  Thought Process (describe rate, content, abstract reasoning, and computation): Slow but logical linear and goal-directed.  Associations: Relevant  Thoughts: normal  Mental Status: Orientation: oriented to person, place, time/date and situation Mood & Affect: depressed affect Attention Span & Concentration: Fair  Medical Decision Making (Choose Three): Established Problem, Stable/Improving (1), Review of Psycho-Social Stressors (1), Review of Last Therapy Session (1) and Review of New Medication or Change in Dosage (2)  Assessment: Axis I: Bipolar disorder, NOS  Axis II: Deferred  Axis III:  Patient Active Problem List   Diagnosis Date Noted  . Atypical chest pain 04/22/2013  . Vaginal discharge 03/13/2013  . Left knee pain 01/09/2013  . GERD (gastroesophageal reflux disease) 12/26/2012  . Stomach burning 12/26/2012  . Right wrist pain 11/16/2012  . Recurrent boils 09/14/2012  . Tobacco abuse 08/22/2012  . Esophageal dysphagia 10/28/2011  . HELICOBACTER PYLORI GASTRITIS, HX OF 03/20/2009  . HYPERTENSION NEC 03/19/2009  . NEPHROLITHIASIS, HX OF 03/19/2009  . HIATAL HERNIA WITH REFLUX 03/16/2009  . HSV 02/03/2007  . DISORDER, BIPOLAR NOS 02/03/2007  . HX, PERSONAL, GENITAL/OBSTETRIC DISORDER NEC 02/03/2007    Axis IV: Moderate  Axis V: 60-65   Plan:  I will continue Zyprexa 20 mg daily, Prozac 20 mg daily, trazodone 150 mg at bedtime, Topamax 50 mg at bedtime and Ativan 1 mg only as needed.  Discussed in detail the risks and benefits of medication.  Recommend to call us back if she has any question or any concern.  Followup in 2 months.  Rece Zechman T., MD 05/07/2013

## 2013-05-07 NOTE — Progress Notes (Signed)
PPD Placement note Letisia C Boster, 37 y.o. female is here today for placement of PPD test Reason for PPD test: work Pt taken PPD test before: no Verified in allergy area and with patient that they are not allergic to the products PPD is made of (Phenol or Tween). Yes Is patient taking any oral or IV steroid medication now or have they taken it in the last month? no Has the patient ever received the BCG vaccine?: no Has the patient been in recent contact with anyone known or suspected of having active TB disease?: no     O: Alert and oriented in NAD. P:  PPD placed on 05/07/2013.  Patient advised to return for reading within 48-72 hours.

## 2013-05-09 ENCOUNTER — Encounter: Payer: Self-pay | Admitting: *Deleted

## 2013-05-09 ENCOUNTER — Ambulatory Visit (HOSPITAL_COMMUNITY): Payer: Self-pay | Admitting: Licensed Clinical Social Worker

## 2013-05-09 ENCOUNTER — Ambulatory Visit (INDEPENDENT_AMBULATORY_CARE_PROVIDER_SITE_OTHER): Payer: Medicare Other | Admitting: *Deleted

## 2013-05-09 ENCOUNTER — Encounter (HOSPITAL_COMMUNITY): Payer: Self-pay | Admitting: Licensed Clinical Social Worker

## 2013-05-09 DIAGNOSIS — Z111 Encounter for screening for respiratory tuberculosis: Secondary | ICD-10-CM

## 2013-05-09 DIAGNOSIS — Z23 Encounter for immunization: Secondary | ICD-10-CM | POA: Diagnosis not present

## 2013-05-09 NOTE — Progress Notes (Signed)
Patient ID: Sandra Miller, female   DOB: 1976-04-12, 37 y.o.   MRN: [id number] Patient cancelled late.

## 2013-05-12 NOTE — Progress Notes (Signed)
Patient here today to have PPD site read and receive flu vaccine.  PPD read and results entered in EpicCare. Result: 0 mm induration. Interpretation: Negative Gaylene Brooks, RN  .

## 2013-05-13 ENCOUNTER — Ambulatory Visit (HOSPITAL_COMMUNITY): Payer: Self-pay | Admitting: Licensed Clinical Social Worker

## 2013-05-15 ENCOUNTER — Emergency Department (INDEPENDENT_AMBULATORY_CARE_PROVIDER_SITE_OTHER)
Admission: EM | Admit: 2013-05-15 | Discharge: 2013-05-15 | Disposition: A | Discharge status: Home / Self Care | Payer: Medicare Other | Source: Home / Self Care | Attending: Emergency Medicine | Admitting: Emergency Medicine

## 2013-05-15 ENCOUNTER — Encounter (HOSPITAL_COMMUNITY): Payer: Self-pay

## 2013-05-15 ENCOUNTER — Ambulatory Visit (HOSPITAL_COMMUNITY): Payer: Self-pay | Admitting: Psychiatry

## 2013-05-15 DIAGNOSIS — R319 Hematuria, unspecified: Secondary | ICD-10-CM | POA: Diagnosis not present

## 2013-05-15 DIAGNOSIS — M549 Dorsalgia, unspecified: Secondary | ICD-10-CM | POA: Diagnosis not present

## 2013-05-15 LAB — POCT URINALYSIS DIP (DEVICE)
Leukocytes, UA: NEGATIVE
Nitrite: NEGATIVE
Protein, ur: NEGATIVE mg/dL
Urobilinogen, UA: 0.2 mg/dL (ref 0.0–1.0)
pH: 6 (ref 5.0–8.0)

## 2013-05-15 MED ORDER — HYDROCODONE-ACETAMINOPHEN 5-325 MG PO TABS: 2.0000 | ORAL_TABLET | ORAL | Status: DC | PRN

## 2013-05-15 MED ORDER — METHOCARBAMOL 500 MG PO TABS: 500.0000 mg | ORAL_TABLET | Freq: Two times a day (BID) | ORAL | Status: DC

## 2013-05-15 NOTE — ED Provider Notes (Signed)
Medical screening examination/treatment/procedure(s) were performed by resident physician or non-physician practitioner and as supervising physician I was immediately available for consultation/collaboration.   KINDL,JAMES DOUGLAS MD.   James D Kindl, MD 05/15/13 2023 

## 2013-05-15 NOTE — ED Provider Notes (Signed)
CSN: [id number]     Arrival date & time 05/15/13  1211 History   First MD Initiated Contact with Patient 05/15/13 1415     Chief Complaint  Patient presents with  . Back Pain   (Consider location/radiation/quality/duration/timing/severity/associated sxs/prior Treatment) Patient is a 37 y.o. female presenting with back pain. The history is provided by the patient. No language interpreter was used.  Back Pain Location:  Lumbar spine Quality:  Aching and stabbing Pain severity:  Moderate Pain is:  Same all the time Onset quality:  Gradual Duration:  2 days Timing:  Constant Progression:  Worsening Chronicity:  New Relieved by:  Nothing Worsened by:  Nothing tried Ineffective treatments:  None tried Associated symptoms: fever   Associated symptoms: no abdominal pain     Past Medical History  Diagnosis Date  . Anxiety   . Hypertension     history of  . Bipolar disorder   . GERD (gastroesophageal reflux disease)   . Hiatal hernia   . HSV   . DISORDER, BIPOLAR NOS   . ALCOHOLISM     history of  . DISORDER, TOBACCO USE   . SUBSTANCE ABUSE     history of  . CONSTIPATION   . Abdominal pain, unspecified site   . EPIGASTRIC PAIN   . HELICOBACTER PYLORI GASTRITIS, HX OF   . NEPHROLITHIASIS, HX OF   . HX, PERSONAL, GENITAL/OBSTETRIC DISORDER NEC   . Esophageal dysphagia    Past Surgical History  Procedure Laterality Date  . Cesarean section  1995  . Knee arthroscopy      left  . Tubal ligation  1995  . [facility]    Family History  Problem Relation Age of Onset  . Stomach cancer Paternal Grandfather   . Diabetes Maternal Grandfather   . Diabetes Paternal Grandmother   . Heart disease Father   . Lung cancer Father   . Heart disease Mother   . Depression Mother   . Colon polyps Brother   . Kidney disease Maternal Uncle   . Cirrhosis Cousin     alcoholic  . Anxiety disorder Maternal Aunt   . Depression Maternal Aunt    History   Substance Use Topics  . Smoking status: Current Every Day Smoker -- 1.00 packs/day for 22 years    Types: Cigarettes  . Smokeless tobacco: Never Used     Comment: form given 10-27-11/patches are not covered under insurance  . Alcohol Use: No     Comment: sober for 9 months   OB History   Grav Para Term Preterm Abortions TAB SAB Ect Mult Living                 Review of Systems  Constitutional: Positive for fever.  Gastrointestinal: Negative for abdominal pain.  Musculoskeletal: Positive for back pain.  All other systems reviewed and are negative.    Allergies  Aspirin and Minocycline  Home Medications   Current Outpatient Rx  Name  Route  Sig  Dispense  Refill  . FLUoxetine (PROZAC) 20 MG capsule   Oral   Take 1 capsule (20 mg total) by mouth daily.   30 capsule   1   . LORazepam (ATIVAN) 1 MG tablet      Take 1 at bed time as needed   30 tablet   0   . metroNIDAZOLE (METROGEL) 0.75 % vaginal gel   Vaginal   Place 1 Applicatorful vaginally 2 (two) times  daily. For 7 days.   70 g   2   . Multiple Vitamin (MULTIVITAMIN) tablet   Oral   Take 1 tablet by mouth daily.         Marland Kitchen OLANZapine (ZYPREXA) 20 MG tablet   Oral   Take 1 tablet (20 mg total) by mouth at bedtime.   30 tablet   1   . topiramate (TOPAMAX) 25 MG tablet      Take 2 tab at bed time   60 tablet   1   . traZODone (DESYREL) 150 MG tablet   Oral   Take 1 tablet (150 mg total) by mouth at bedtime.   30 tablet   1   . VITAMIN E PO   Oral   Take 3 capsules by mouth daily.          BP 123/75  Pulse 82  Temp(Src) 98 F (36.7 C) (Oral)  Resp 16  SpO2 100% Physical Exam  Nursing note and vitals reviewed. Constitutional: She appears well-developed and well-nourished.  HENT:  Head: Normocephalic.  Eyes: Pupils are equal, round, and reactive to light.  Neck: Normal range of motion.  Cardiovascular: Normal rate.   Pulmonary/Chest: Effort normal.  Abdominal: Soft.   Musculoskeletal:  Tender ls spine,  Tender right side  From,  nv and ns intact  Neurological: She is alert.  Skin: Skin is warm.    ED Course  Procedures (including critical care time) Labs Review Labs Reviewed  POCT URINALYSIS DIP (DEVICE) - Abnormal; Notable for the following:    Hgb urine dipstick SMALL (*)    All other components within normal limits  URINALYSIS, DIPSTICK ONLY   Imaging Review No results found.  MDM   1. Back pain    Small blood in urine.   I think pain is muscular,  Pt given robaxin and hydrocodone.   Pt advised to ED if pain worsen,  I advised pt to see her Md for recheck in 2-3 days.  Pt may need Ct of kidneys if not improving    Elson Areas, PA-C 05/15/13 1552

## 2013-05-15 NOTE — ED Notes (Signed)
Reportedly woke 2 days ago w pain in low back into her right hip and leg; no known injury or stress to back; NAD

## 2013-05-16 ENCOUNTER — Ambulatory Visit (HOSPITAL_COMMUNITY): Payer: Self-pay | Admitting: Licensed Clinical Social Worker

## 2013-05-23 ENCOUNTER — Ambulatory Visit (HOSPITAL_COMMUNITY): Payer: Self-pay | Admitting: Licensed Clinical Social Worker

## 2013-05-27 ENCOUNTER — Ambulatory Visit (HOSPITAL_COMMUNITY): Payer: Self-pay | Admitting: Licensed Clinical Social Worker

## 2013-05-29 ENCOUNTER — Ambulatory Visit (INDEPENDENT_AMBULATORY_CARE_PROVIDER_SITE_OTHER): Payer: Medicare Other | Admitting: Family Medicine

## 2013-05-29 ENCOUNTER — Encounter: Payer: Self-pay | Admitting: Family Medicine

## 2013-05-29 VITALS — BP 112/76 | HR 78 | Temp 99.0°F | Ht 66.0 in | Wt 177.8 lb

## 2013-05-29 DIAGNOSIS — N760 Acute vaginitis: Secondary | ICD-10-CM | POA: Diagnosis not present

## 2013-05-29 DIAGNOSIS — Z113 Encounter for screening for infections with a predominantly sexual mode of transmission: Secondary | ICD-10-CM | POA: Insufficient documentation

## 2013-05-29 DIAGNOSIS — Z202 Contact with and (suspected) exposure to infections with a predominantly sexual mode of transmission: Secondary | ICD-10-CM

## 2013-05-29 DIAGNOSIS — N898 Other specified noninflammatory disorders of vagina: Secondary | ICD-10-CM

## 2013-05-29 DIAGNOSIS — Z2089 Contact with and (suspected) exposure to other communicable diseases: Secondary | ICD-10-CM

## 2013-05-29 DIAGNOSIS — M549 Dorsalgia, unspecified: Secondary | ICD-10-CM | POA: Diagnosis not present

## 2013-05-29 DIAGNOSIS — Z20828 Contact with and (suspected) exposure to other viral communicable diseases: Secondary | ICD-10-CM | POA: Diagnosis not present

## 2013-05-29 DIAGNOSIS — R5381 Other malaise: Secondary | ICD-10-CM

## 2013-05-29 LAB — CBC WITH DIFFERENTIAL/PLATELET
Basophils Absolute: 0 10*3/uL (ref 0.0–0.1)
Eosinophils Absolute: 0.2 10*3/uL (ref 0.0–0.7)
Lymphocytes Relative: 37 % (ref 12–46)
Lymphs Abs: 4 10*3/uL (ref 0.7–4.0)
MCH: 29.1 pg (ref 26.0–34.0)
Neutrophils Relative %: 56 % (ref 43–77)
Platelets: 341 10*3/uL (ref 150–400)
RBC: 4.54 MIL/uL (ref 3.87–5.11)
RDW: 14.4 % (ref 11.5–15.5)
WBC: 10.8 10*3/uL — ABNORMAL HIGH (ref 4.0–10.5)

## 2013-05-29 LAB — POCT UA - MICROSCOPIC ONLY

## 2013-05-29 LAB — POCT WET PREP (WET MOUNT): Clue Cells Wet Prep Whiff POC: NEGATIVE

## 2013-05-29 LAB — POCT URINALYSIS DIPSTICK
Nitrite, UA: NEGATIVE
Urobilinogen, UA: 0.2
pH, UA: 6

## 2013-05-29 MED ORDER — METRONIDAZOLE 0.75 % VA GEL: 1.0000 | Freq: Two times a day (BID) | VAGINAL | Status: DC
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
## 2013-05-29 NOTE — Patient Instructions (Addendum)
It was nice to meet you today! The back pain sounds musculoskeletal, but to be safe we are checking for any vaginal infections which could cause this as well. Try taking ibuprofen with food (600mg  three times per day) and alternate with tylenol.  For the vaginal spotting, I recommend you call your OB/GYN doctor and follow up with them, as you may need a biopsy of the lining of your uterus.  Return to see me in 3 weeks to follow up on the back pain. If it is still hurting at that time, we will obtain xray or other imaging of your back. Return sooner if it is getting worse, you have fevers, weakness in your legs, crotch numbness, or are unable to urinate or have bowel movements normally (including incontinence).  I will call you if your test results are not normal.  Otherwise, I will send you a letter.  If you do not hear from me with in 2 weeks please call our office.    Be well, Dr. Pollie Meyer   Back Pain, Adult Low back pain is very common. About 1 in 5 people have back pain.The cause of low back pain is rarely dangerous. The pain often gets better over time.About half of people with a sudden onset of back pain feel better in just 2 weeks. About 8 in 10 people feel better by 6 weeks.  CAUSES Some common causes of back pain include:  Strain of the muscles or ligaments supporting the spine.  Wear and tear (degeneration) of the spinal discs.  Arthritis.  Direct injury to the back. DIAGNOSIS Most of the time, the direct cause of low back pain is not known.However, back pain can be treated effectively even when the exact cause of the pain is unknown.Answering your caregiver's questions about your overall health and symptoms is one of the most accurate ways to make sure the cause of your pain is not dangerous. If your caregiver needs more information, he or she may order lab work or imaging tests (X-rays or MRIs).However, even if imaging tests show changes in your back, this usually does not  require surgery. HOME CARE INSTRUCTIONS For many people, back pain returns.Since low back pain is rarely dangerous, it is often a condition that people can learn to [facility] their own.   Remain active. It is stressful on the back to sit or stand in one place. Do not sit, drive, or stand in one place for more than 30 minutes at a time. Take short walks on level surfaces as soon as pain allows.Try to increase the length of time you walk each day.  Do not stay in bed.Resting more than 1 or 2 days can delay your recovery.  Do not avoid exercise or work.Your body is made to move.It is not dangerous to be active, even though your back may hurt.Your back will likely heal faster if you return to being active before your pain is gone.  Pay attention to your body when you bend and lift. Many people have less discomfortwhen lifting if they bend their knees, keep the load close to their bodies,and avoid twisting. Often, the most comfortable positions are those that put less stress on your recovering back.  Find a comfortable position to sleep. Use a firm mattress and lie on your side with your knees slightly bent. If you lie on your back, put a pillow under your knees.  Only take over-the-counter or prescription medicines as directed by your caregiver. Over-the-counter medicines to reduce pain  and inflammation are often the most helpful.Your caregiver may prescribe muscle relaxant drugs.These medicines help dull your pain so you can more quickly return to your normal activities and healthy exercise.  Put ice on the injured area.  Put ice in a plastic bag.  Place a towel between your skin and the bag.  Leave the ice on for 15-20 minutes, 3-4 times a day for the first 2 to 3 days. After that, ice and heat may be alternated to reduce pain and spasms.  Ask your caregiver about trying back exercises and gentle massage. This may be of some benefit.  Avoid feeling anxious or stressed.Stress  increases muscle tension and can worsen back pain.It is important to recognize when you are anxious or stressed and learn ways to manage it.Exercise is a great option. SEEK MEDICAL CARE IF:  You have pain that is not relieved with rest or medicine.  You have pain that does not improve in 1 week.  You have new symptoms.  You are generally not feeling well. SEEK IMMEDIATE MEDICAL CARE IF:   You have pain that radiates from your back into your legs.  You develop new bowel or bladder control problems.  You have unusual weakness or numbness in your arms or legs.  You develop nausea or vomiting.  You develop abdominal pain.  You feel faint. Document Released: 08/22/2005 Document Revised: 02/21/2012 Document Reviewed: 01/10/2011 [facility] Patient Information 2014 [place], [place].

## 2013-05-30 ENCOUNTER — Ambulatory Visit (HOSPITAL_COMMUNITY): Payer: Self-pay | Admitting: Licensed Clinical Social Worker

## 2013-05-30 ENCOUNTER — Other Ambulatory Visit (HOSPITAL_COMMUNITY)
Admission: RE | Admit: 2013-05-30 | Discharge: 2013-05-30 | Disposition: A | Discharge status: Home / Self Care | Payer: Medicare Other | Source: Ambulatory Visit | Attending: Family Medicine | Admitting: Family Medicine

## 2013-05-30 LAB — RPR

## 2013-05-31 LAB — HEPATITIS PANEL, ACUTE: Hep B C IgM: NEGATIVE

## 2013-06-03 ENCOUNTER — Telehealth: Payer: Self-pay | Admitting: Family Medicine

## 2013-06-03 NOTE — Telephone Encounter (Signed)
Attempted to call patient to relay normal results of all bloodwork. No answer but left voicemail with generic "your labs were normal" message. Offered for her to call the clinic if she has any questions. Pt had given me verbal permission to leave a VM during our visit last week.

## 2013-06-06 ENCOUNTER — Ambulatory Visit (INDEPENDENT_AMBULATORY_CARE_PROVIDER_SITE_OTHER): Payer: Medicare Other | Admitting: Licensed Clinical Social Worker

## 2013-06-06 DIAGNOSIS — F319 Bipolar disorder, unspecified: Secondary | ICD-10-CM

## 2013-06-06 NOTE — Progress Notes (Signed)
   THERAPIST PROGRESS NOTE  Session Time: 3:00pm-3:50pm  Participation Level: Active  Behavioral Response: Well GroomedAlertEuthymic  Type of Therapy: Individual Therapy  Treatment Goals addressed: Coping  Interventions: CBT, Motivational Interviewing, Strength-based, Supportive and Reframing  Summary: Sandra Miller is a 37 y.o. female who presents with euthymic mood and bright affect. She reports a dramatic improvement in her degree of anxiety since restarting Zyprexa. She is very concerned about weight gain, is angry that her clothes don't fit and is fearful that she will become so upset by this she will stop taking medication. She just learned that her daughter is pregnant and processes her shock over this. She is working and in school and overall pleased with her life.    Suicidal/Homicidal: Nowithout intent/plan  Therapist Response: Assessed patients current functioning and reviewed progress. Reviewed coping strategies. Assessed patients safety and assisted in identifying protective factors.  Reviewed crisis plan with patient. Assisted patient with the expression of fear over medication change and weight gain. Reviewed patients self care plan. Assessed progress related to self care. Patients self care is good. Recommend daily exercise, increased socialization and recreation. Used CBT to assist patient with the identification of negative distortions and irrational thoughts. Encouraged patient to verbalize alternative and factual responses which challenge thought distortions. Used DBT to practice mindfulness, review distraction list and improve distress tolerance skills. Used motivational interviewing to assist and encourage patient through the change process. Explored patients barriers to change.   Plan: Return again in two to four weeks.  Diagnosis: Axis I: bi polar    Axis II: No diagnosis    Gillis Boardley, LCSW 06/06/2013

## 2013-06-10 ENCOUNTER — Ambulatory Visit (HOSPITAL_COMMUNITY): Payer: Self-pay | Admitting: Licensed Clinical Social Worker

## 2013-06-13 ENCOUNTER — Ambulatory Visit (HOSPITAL_COMMUNITY): Payer: Self-pay | Admitting: Licensed Clinical Social Worker

## 2013-06-20 ENCOUNTER — Ambulatory Visit (INDEPENDENT_AMBULATORY_CARE_PROVIDER_SITE_OTHER): Payer: Medicare Other | Admitting: Licensed Clinical Social Worker

## 2013-06-20 DIAGNOSIS — F319 Bipolar disorder, unspecified: Secondary | ICD-10-CM

## 2013-06-20 NOTE — Progress Notes (Signed)
   THERAPIST PROGRESS NOTE  Session Time: 3:00pm-3:50pm  Participation Level: Active  Behavioral Response: Well GroomedAlertAnxious, Depressed and Irritable  Type of Therapy: Individual Therapy  Treatment Goals addressed: Coping  Interventions: CBT, Motivational Interviewing, Strength-based, Supportive and Reframing  Summary: Sandra Miller is a 37 y.o. female who presents with depressed mood and agitated affect. She reports feeling agitated most of the time, and her children are telling her that she snaps at them all the time. She is upset that she is gaining weight on Zyprexa and now feels that it is not working well due to her increased agitation. She plans to tell Dr. Lolly Mustache that she wants a different medication but is very anxious about that. She reports feeling hungry all the time and constantly wanting sweets. She does not want to gain so much weight that she stops caring about herself and begins to drink again.    Suicidal/Homicidal: Nowithout intent/plan  Therapist Response: Assessed patients current functioning and reviewed progress. Reviewed coping strategies. Assessed patients safety and assisted in identifying protective factors.  Reviewed crisis plan with patient. Assisted patient with the expression of anger and anxiety. Reviewed patients self care plan. Assessed progress related to self care. Patients self care is fair. Recommend daily exercise, increased socialization and recreation.  Used CBT to assist patient with the identification of negative distortions and irrational thoughts. Encouraged patient to verbalize alternative and factual responses which challenge thought distortions. Used DBT to practice mindfulness, review distraction list and improve distress tolerance skills. Used motivational interviewing to assist and encourage patient through the change process. Explored patients barriers to change.   Plan: Return again in two weeks.  Diagnosis: Axis I: Bipolar,  Depressed    Axis II: No diagnosis    Detrice Cales, LCSW 06/20/2013

## 2013-06-24 ENCOUNTER — Ambulatory Visit (HOSPITAL_COMMUNITY): Payer: Self-pay | Admitting: Licensed Clinical Social Worker

## 2013-07-08 ENCOUNTER — Ambulatory Visit (HOSPITAL_COMMUNITY): Payer: Self-pay | Admitting: Psychiatry

## 2013-07-09 ENCOUNTER — Other Ambulatory Visit: Payer: Self-pay | Admitting: Gastroenterology

## 2013-07-09 ENCOUNTER — Encounter (HOSPITAL_COMMUNITY): Payer: Self-pay | Admitting: Psychiatry

## 2013-07-09 ENCOUNTER — Other Ambulatory Visit (HOSPITAL_COMMUNITY): Payer: Self-pay | Admitting: Psychiatry

## 2013-07-09 ENCOUNTER — Ambulatory Visit (INDEPENDENT_AMBULATORY_CARE_PROVIDER_SITE_OTHER): Payer: Medicare Other | Admitting: Psychiatry

## 2013-07-09 VITALS — BP 153/96 | HR 86 | Wt 184.4 lb

## 2013-07-09 DIAGNOSIS — F319 Bipolar disorder, unspecified: Secondary | ICD-10-CM

## 2013-07-09 MED ORDER — TOPIRAMATE 25 MG PO TABS: ORAL_TABLET | ORAL | Status: DC

## 2013-07-09 MED ORDER — TRAZODONE HCL 150 MG PO TABS: 150.0000 mg | ORAL_TABLET | Freq: Every day | ORAL | Status: DC

## 2013-07-09 MED ORDER — FLUOXETINE HCL 20 MG PO CAPS: 20.0000 mg | ORAL_CAPSULE | Freq: Every day | ORAL | Status: DC

## 2013-07-09 MED ORDER — LORAZEPAM 1 MG PO TABS: ORAL_TABLET | ORAL | Status: DC

## 2013-07-09 MED ORDER — ZIPRASIDONE HCL 40 MG PO CAPS: ORAL_CAPSULE | ORAL | Status: DC

## 2013-07-09 NOTE — Progress Notes (Signed)
[facility] [address] Progress Note  Sandra Miller [id number] 37 y.o.  07/09/2013 3:18 PM  Chief Complaint  Patient presents with  . Follow-up  . Medication Refill  . Stress    History of Present Illness: Patient is 37 year old female who came for her followup appointment.   Patient is very depressed because of the weight gain.  She does not want to take Zyprexa.  She is tearful and admitted feeling of hopelessness and discussed it with the weight.  She is also concerned about her daughter who is now pregnant.  She is not happy about it but there is nothing that patient can do anything about it.  Her daughter is living with her boyfriend.  Patient is happy that her son is working .  Patient is also working and also attending the school .  She sleeps on and off.  She complained of racing thoughts and despite taking Zyprexa 20 mg she is feeling sad and disappointed.  She will also try a different medication.  She is seeing therapist.  She has no tremors or shakes.  Her headache is much improved with Topamax.  Today her blood pressure was high.  She admitted increased stress at her residence because of the weight gain.  She's not drinking or using any illegal substance.  She denies any hallucination or any paranoia.  Patient was seen by primary care physician because of lower back pain, she was prescribed narcotic pain medication and muscle relaxants however patient refused to take it.  Her back pain is much better.   Suicidal Ideation: No Plan Formed: No Patient has means to carry out plan: No  Homicidal Ideation: No Plan Formed: No Patient has means to carry out plan: No  Review of Systems  Constitutional: Negative.   HENT: Negative.   Musculoskeletal: Positive for back pain.  Skin: Negative.   Psychiatric/Behavioral: Negative.  Negative for suicidal ideas.    Psychiatric: Agitation: Yes Hallucination: No Depressed Mood: Yes Insomnia: Yes Hypersomnia: No Altered  Concentration: No Feels Worthless: Yes Grandiose Ideas: No Belief In Special Powers: No New/Increased Substance Abuse: No Compulsions: No  Neurologic: Headache: No Seizure: No Paresthesias: No  Past history. Patient has history of multiple psychiatric inpatient treatment.  She has history of impulsive behavior aggression and violence paranoia and psychotic symptoms.    Medical history. Patient has history of hypertension , migraine headache and GERD.    Psychosocial history . Patient lives with her 21 year old son.  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     Outpatient Encounter Prescriptions as of 07/09/2013  Medication Sig  . FLUoxetine (PROZAC) 20 MG capsule Take 1 capsule (20 mg total) by mouth daily.  Marland Kitchen LORazepam (ATIVAN) 1 MG tablet Take 1 at bed time as needed  . metroNIDAZOLE (METROGEL) 0.75 % vaginal gel Place 1 Applicatorful vaginally 2 (two) times daily. For 5 days.  . Multiple Vitamin (MULTIVITAMIN) tablet Take 1 tablet by mouth daily.  Marland Kitchen OLANZapine (ZYPREXA) 20 MG tablet Take 1 tablet (20 mg total) by mouth at bedtime.  Marland Kitchen omeprazole (PRILOSEC) 40 MG capsule take 1 capsule by mouth twice a day  . topiramate (TOPAMAX) 25 MG tablet Take 2 tab at bed time  . traZODone (DESYREL) 150 MG tablet Take 1 tablet (150 mg total) by mouth at bedtime.  Marland Kitchen VITAMIN E PO Take 3 capsules by mouth daily.  . [DISCONTINUED] FLUoxetine (PROZAC) 20 MG capsule Take 1 capsule (20 mg total)  by mouth daily.  . [DISCONTINUED] LORazepam (ATIVAN) 1 MG tablet Take 1 at bed time as needed  . [DISCONTINUED] topiramate (TOPAMAX) 25 MG tablet Take 2 tab at bed time  . [DISCONTINUED] traZODone (DESYREL) 150 MG tablet Take 1 tablet (150 mg total) by mouth at bedtime.  Marland Kitchen HYDROcodone-acetaminophen (NORCO/VICODIN) 5-325 MG per tablet Take 2 tablets by mouth every 4 (four) hours as needed for pain.  . methocarbamol (ROBAXIN) 500 MG tablet Take 1 tablet (500 mg total) by mouth 2 (two)  times daily.  . ziprasidone (GEODON) 40 MG capsule Take 1-2 capsule at bed time    Past Psychiatric History/Hospitalization(s): Patient has been seen in this office since 2008 on and off.  She has history of bipolar disorder, alcohol dependence and marijuana abuse.  She claims to be sober for past 16 months from drugs and alcohol.  She has history of agitation anger on aggressive behavior.  Patient has multiple hospitalization.  Her first psychiatric hospitalization was at age 65.  She is at [facility] psychiatric [facility].  [facility].  In the past she had tried lithium, Seroquel, Lamictal, Paxil, Celexa, Depakote, Neurontin and Cymbalta with limited response.  Patient is very reluctant to take any medication that cause weight gain.  Patient denies any history of suicidal attempt but admitted history of aggression and violence. Anxiety: Yes Bipolar Disorder: Yes Depression: Yes Mania: Yes Psychosis: Yes Schizophrenia: No Personality Disorder: No Hospitalization for psychiatric illness: Yes History of Electroconvulsive Shock Therapy: No Prior Suicide Attempts: No  Physical Exam: Constitutional:  BP 153/96  Pulse 86  Wt 184 lb 6.4 oz (83.643 kg)  Recent Results (from the past 2160 hour(s))  TB SKIN TEST     Status: Normal   Collection Time    05/09/13  2:11 PM      Result Value Range   TB Skin Test Negative     Induration 0    POCT URINALYSIS DIP (DEVICE)     Status: Abnormal   Collection Time    05/15/13  2:31 PM      Result Value Range   Glucose, UA NEGATIVE  NEGATIVE mg/dL   Bilirubin Urine NEGATIVE  NEGATIVE   Ketones, ur NEGATIVE  NEGATIVE mg/dL   Specific Gravity, Urine 1.020  1.005 - 1.030   Hgb urine dipstick SMALL (*) NEGATIVE   pH 6.0  5.0 - 8.0   Protein, ur NEGATIVE  NEGATIVE mg/dL   Urobilinogen, UA 0.2  0.0 - 1.0 mg/dL   Nitrite NEGATIVE  NEGATIVE   Leukocytes, UA NEGATIVE  NEGATIVE   Comment:  Biochemical Testing Only. Please order routine urinalysis from main lab if confirmatory testing is needed.  POCT URINALYSIS DIPSTICK     Status: Abnormal   Collection Time    05/29/13  3:03 PM      Result Value Range   Color, UA yellow     Clarity, UA clear     Glucose, UA negative     Bilirubin, UA negative     Ketones, UA negative     Spec Grav, UA <=1.005     Blood, UA moderate     pH, UA 6.0     Protein, UA negative     Urobilinogen, UA 0.2     Nitrite, UA negative     Leukocytes, UA Negative    POCT URINE PREGNANCY     Status: None   Collection Time    05/29/13  3:03 PM      Result Value Range   Preg Test, Ur Negative    POCT WET PREP (WET MOUNT)     Status: Abnormal   Collection Time    05/29/13  3:03 PM      Result Value Range   Source Wet Prep POC VAG     WBC, Wet Prep HPF POC 1-5     Bacteria Wet Prep HPF POC 3+ COCCI & RODS     Clue Cells Wet Prep HPF POC Moderate     CLUE CELLS WET PREP WHIFF POC Negative Whiff     Yeast Wet Prep HPF POC None     Trichomonas Wet Prep HPF POC None    POCT UA - MICROSCOPIC ONLY     Status: None   Collection Time    05/29/13  3:03 PM      Result Value Range   WBC, Ur, HPF, POC RARE     RBC, urine, microscopic OCC     Bacteria, U Microscopic 2+     Epithelial cells, urine per micros 1-5    CBC WITH DIFFERENTIAL     Status: Abnormal   Collection Time    05/29/13  3:32 PM      Result Value Range   WBC 10.8 (*) 4.0 - 10.5 K/uL   RBC 4.54  3.87 - 5.11 MIL/uL   Hemoglobin 13.2  12.0 - 15.0 g/dL   HCT 81.1  91.4 - 78.2 %   MCV 84.8  78.0 - 100.0 fL   MCH 29.1  26.0 - 34.0 pg   MCHC 34.3  30.0 - 36.0 g/dL   RDW 95.6  21.3 - 08.6 %   Platelets 341  150 - 400 K/uL   Neutrophils Relative % 56  43 - 77 %   Neutro Abs 6.1  1.7 - 7.7 K/uL   Lymphocytes Relative 37  12 - 46 %   Lymphs Abs 4.0  0.7 - 4.0 K/uL   Monocytes Relative 5  3 - 12 %   Monocytes Absolute 0.5  0.1 - 1.0 K/uL   Eosinophils Relative 2  0 - 5 %   Eosinophils  Absolute 0.2  0.0 - 0.7 K/uL   Basophils Relative 0  0 - 1 %   Basophils Absolute 0.0  0.0 - 0.1 K/uL   Smear Review Criteria for review not met    RPR     Status: None   Collection Time    05/29/13  3:32 PM      Result Value Range   RPR NON REAC  NON REAC  HIV ANTIBODY (ROUTINE TESTING)     Status: None   Collection Time    05/29/13  3:32 PM      Result Value Range   HIV NON REACTIVE  NON REACTIVE  HEPATITIS PANEL, ACUTE     Status: None   Collection Time    05/29/13  3:32 PM      Result Value Range   Hepatitis B Surface Ag NEGATIVE  NEGATIVE   Hep B C IgM NEG  NEGATIVE   Comment: High levels of Hepatitis B Core IgM antibody are detectable     during the acute stage of Hepatitis B. This antibody is used     to differentiate current from past HBV infection.         Hep A IgM NEG  NEGATIVE   HCV Ab NEGATIVE  NEGATIVE    General Appearance: well nourished,  tearful and frustrated.  Musculoskeletal: Strength & Muscle Tone: within normal limits Gait & Station: normal Patient leans: N/A  Psychiatric: Speech (describe rate, volume, coherence, spontaneity, and abnormalities if any): Slow but clear and coherent.  Thought Process (describe rate, content, abstract reasoning, and computation): Slow but logical linear and goal-directed.  Associations: Relevant  Thoughts: normal  Mental Status: Orientation: oriented to person, place, time/date and situation Mood & Affect: depressed affect Attention Span & Concentration: Fair  Medical Decision Making (Choose Three): Established Problem, Stable/Improving (1), Review of Psycho-Social Stressors (1), Review or order clinical lab tests (1), Established Problem, Worsening (2), Review of Last Therapy Session (1), Review of Medication Regimen & Side Effects (2) and Review of New Medication or Change in Dosage (2)  Assessment: Axis I: Bipolar disorder, NOS  Axis II: Deferred  Axis III:  Patient Active Problem List   Diagnosis Date  Noted  . Atypical chest pain 04/22/2013  . Vaginal discharge 03/13/2013  . Left knee pain 01/09/2013  . GERD (gastroesophageal reflux disease) 12/26/2012  . Stomach burning 12/26/2012  . Right wrist pain 11/16/2012  . Recurrent boils 09/14/2012  . Tobacco abuse 08/22/2012  . Esophageal dysphagia 10/28/2011  . HELICOBACTER PYLORI GASTRITIS, HX OF 03/20/2009  . HYPERTENSION NEC 03/19/2009  . NEPHROLITHIASIS, HX OF 03/19/2009  . HIATAL HERNIA WITH REFLUX 03/16/2009  . HSV 02/03/2007  . DISORDER, BIPOLAR NOS 02/03/2007  . HX, PERSONAL, GENITAL/OBSTETRIC DISORDER NEC 02/03/2007    Axis IV: Moderate  Axis V: 60-65   Plan:  I will try Geodon 40 mg one to 2 capsule at bedtime to help paranoia and I will discontinue Zyprexa.  However I recommended for first few days she should take Zyprexa 10 mg and 40 mg of Geodon together and then stop Zyprexa completely and take Geodon 80 mg at bedtime.  Recommend to see therapist for coping and social skills.  Continue Prozac, trazodone and Topamax at present dose.  Followup in 4 weeks.  Time spent 25 minutes.  More than 50% of the time spent in psychoeducation, counseling and coordination of care.  Discuss safety plan that anytime having active suicidal thoughts or homicidal thoughts then patient need to call 911 or go to the local emergency room. Cleotis Nipper., MD 07/09/2013

## 2013-07-09 NOTE — Telephone Encounter (Signed)
PATIENT WILL NEED AN OFFICE VISIT FOR FURTHER REFILLS  

## 2013-07-11 ENCOUNTER — Ambulatory Visit (INDEPENDENT_AMBULATORY_CARE_PROVIDER_SITE_OTHER): Payer: Medicare Other | Admitting: Licensed Clinical Social Worker

## 2013-07-11 DIAGNOSIS — F319 Bipolar disorder, unspecified: Secondary | ICD-10-CM | POA: Diagnosis not present

## 2013-07-11 NOTE — Progress Notes (Signed)
   THERAPIST PROGRESS NOTE  Session Time: 3:00pm-3:50pm  Participation Level: Active  Behavioral Response: Well GroomedAlertAnxious and Irritable  Type of Therapy: Individual Therapy  Treatment Goals addressed: Coping  Interventions: CBT, Motivational Interviewing, Strength-based, Supportive and Reframing  Summary: Sandra Miller is a 37 y.o. female who presents with anxious mood and agitated affect. She is pleased that Dr. Lolly Mustache agreed to take her off Zyprexa. She has gained 20 pounds in total and remains frustrated and angry over this. She states that she is managing her stress level somewhat better. She processes her agitation with her children and how much they need her. She thought that she would not be able to live without them, but now wants to live on her own. Her sleep and appetite are wnl.   Suicidal/Homicidal: Nowithout intent/plan  Therapist Response: Assessed patients current functioning and reviewed progress. Reviewed coping strategies. Assessed patients safety and assisted in identifying protective factors.  Reviewed crisis plan with patient. Assisted patient with the expression of frustration. Reviewed patients self care plan. Assessed progress related to self care. Patients self care is fair but could improve. Recommend daily exercise, increased socialization and recreation. Used CBT to assist patient with the identification of negative distortions and irrational thoughts. Encouraged patient to verbalize alternative and factual responses which challenge thought distortions. Used motivational interviewing to assist and encourage patient through the change process. Explored patients barriers to change.   Plan: Return again in two to three weeks.  Diagnosis: Axis I: bi polar     Axis II: No diagnosis    Isis Costanza, LCSW 07/11/2013

## 2013-07-25 ENCOUNTER — Ambulatory Visit (INDEPENDENT_AMBULATORY_CARE_PROVIDER_SITE_OTHER): Payer: Medicare Other | Admitting: Licensed Clinical Social Worker

## 2013-07-25 ENCOUNTER — Encounter: Payer: Self-pay | Admitting: *Deleted

## 2013-07-25 DIAGNOSIS — F319 Bipolar disorder, unspecified: Secondary | ICD-10-CM

## 2013-07-25 NOTE — Progress Notes (Signed)
   THERAPIST PROGRESS NOTE  Session Time: 3:00pm-3:50pm  Participation Level: Active  Behavioral Response: Well GroomedAlertAnxious, Depressed and Irritable  Type of Therapy: Individual Therapy  Treatment Goals addressed: Coping  Interventions: CBT, Motivational Interviewing, Strength-based, Supportive and Reframing  Summary: Sandra Miller is a 36 y.o. female who presents with depressed mood and anxious affect. She reports that she has started taking Geodon and is not experiencing any side effects. She remains cautious and anxious about new medication. She processes her stress related to a very busy schedule with school and work. She contemplates getting rid of her dog because she is not at home, but is fearful that she will become more depressed if she does this. She is sleeping well, but not eating due to chronic stomach pain. She will see her GI doctor next week.    Suicidal/Homicidal: Nowithout intent/plan  Therapist Response: Assessed patients current functioning and reviewed progress. Reviewed coping strategies. Assessed patients safety and assisted in identifying protective factors.  Reviewed crisis plan with patient. Assisted patient with the expression of anxiety. Reviewed patients self care plan. Assessed progress related to self care. Patients self care is good. Recommend daily exercise, increased socialization and recreation. Used CBT to assist patient with the identification of negative distortions and irrational thoughts. Encouraged patient to verbalize alternative and factual responses which challenge thought distortions. Used DBT to practice mindfulness, review distraction list and improve distress tolerance skills. Used motivational interviewing to assist and encourage patient through the change process. Explored patients barriers to change.   Plan: Return again in three weeks.  Diagnosis: Axis I: bi polar nos    Axis II: No diagnosis    Wilkes Potvin,  LCSW 07/25/2013

## 2013-07-30 ENCOUNTER — Encounter: Payer: Self-pay | Admitting: Gastroenterology

## 2013-07-30 ENCOUNTER — Ambulatory Visit (INDEPENDENT_AMBULATORY_CARE_PROVIDER_SITE_OTHER): Payer: Medicare Other | Admitting: Gastroenterology

## 2013-07-30 VITALS — BP 118/72 | HR 74 | Ht 65.5 in | Wt 189.0 lb

## 2013-07-30 DIAGNOSIS — R109 Unspecified abdominal pain: Secondary | ICD-10-CM

## 2013-07-30 DIAGNOSIS — K30 Functional dyspepsia: Secondary | ICD-10-CM

## 2013-07-30 DIAGNOSIS — R198 Other specified symptoms and signs involving the digestive system and abdomen: Secondary | ICD-10-CM

## 2013-07-30 DIAGNOSIS — F1911 Other psychoactive substance abuse, in remission: Secondary | ICD-10-CM

## 2013-07-30 DIAGNOSIS — F1910 Other psychoactive substance abuse, uncomplicated: Secondary | ICD-10-CM

## 2013-07-30 DIAGNOSIS — K319 Disease of stomach and duodenum, unspecified: Secondary | ICD-10-CM

## 2013-07-30 DIAGNOSIS — F319 Bipolar disorder, unspecified: Secondary | ICD-10-CM

## 2013-07-30 MED ORDER — CILIDINIUM-CHLORDIAZEPOXIDE 2.5-5 MG PO CAPS: 1.0000 | ORAL_CAPSULE | Freq: Three times a day (TID) | ORAL | Status: DC

## 2013-07-30 MED ORDER — AMBULATORY NON FORMULARY MEDICATION: Status: DC

## 2013-07-30 MED ORDER — NA SULFATE-K SULFATE-MG SULF 17.5-3.13-1.6 GM/177ML PO SOLN: ORAL | Status: DC

## 2013-07-30 NOTE — Progress Notes (Addendum)
This is a 37 year old African American female who has had many years of diffuse abdominal burning pain without real alleviating or precipitating elements except that her pain is made worse by eating.  It is located in the upper and lower abdomen diffusely, and is not better with PPIs, H2 blockers or Carafate.  His had previous thorough GI workups which are been unremarkable.  She has chronic depression is on trazodone which helps her sleep at night.  She currently is on omeprazole 40 mg a day, lorazepam 1 mg a day, fluoxetine 20 mg a day,olanzapine 20 grams a day, topiramate 25 mg a day, and geodon 40-80 mg a day.  She obviously has a chronic bipolar disorder.  She [facility].  She also has a history of alcohol, tobacco and substance abuse.  Despite all these complaints, she's had no anorexia, weight loss, melena hematochezia, change in bowel habits, or hepatobiliary symptoms or systemic symptoms such as fever chills.  Current Medications, Allergies, Past Medical History, Past Surgical History, Family History and Social History were reviewed in [employer] record.  ROS: All systems were reviewed and are negative unless otherwise stated in the HPI.   Allergies  Allergen Reactions  . Aspirin Hives  . Minocycline Hives   Outpatient Prescriptions Prior to Visit  Medication Sig Dispense Refill  . FLUoxetine (PROZAC) 20 MG capsule Take 1 capsule (20 mg total) by mouth daily.  30 capsule  0  . LORazepam (ATIVAN) 1 MG tablet Take 1 at bed time as needed  30 tablet  0  . metroNIDAZOLE (METROGEL) 0.75 % vaginal gel Place 1 Applicatorful vaginally 2 (two) times daily. For 5 days.  70 g  0  . Multiple Vitamin (MULTIVITAMIN) tablet Take 1 tablet by mouth daily.      Marland Kitchen OLANZapine (ZYPREXA) 20 MG tablet Take 1 tablet (20 mg total) by mouth at bedtime.  30 tablet  1  . omeprazole (PRILOSEC) 40 MG capsule take 1 capsule by mouth twice a day   60 capsule  0  . topiramate (TOPAMAX) 25 MG tablet Take 2 tab at bed time  60 tablet  0  . traZODone (DESYREL) 150 MG tablet Take 1 tablet (150 mg total) by mouth at bedtime.  30 tablet  0  . VITAMIN E PO Take 3 capsules by mouth daily.      . ziprasidone (GEODON) 40 MG capsule Take 1-2 capsule at bed time  60 capsule  0   No facility-administered medications prior to visit.   Past Medical History  Diagnosis Date  . Anxiety   . Hypertension     history of  . GERD (gastroesophageal reflux disease)   . Hiatal hernia   . HSV   . DISORDER, BIPOLAR NOS   . ALCOHOLISM     history of  . DISORDER, TOBACCO USE   . SUBSTANCE ABUSE     history of  . CONSTIPATION   . Abdominal pain, unspecified site   . EPIGASTRIC PAIN   . HELICOBACTER PYLORI GASTRITIS, HX OF   . NEPHROLITHIASIS, HX OF   . HX, PERSONAL, GENITAL/OBSTETRIC DISORDER NEC   . Esophageal dysphagia    Past Surgical History  Procedure Laterality Date  . Cesarean section  1995  . Knee arthroscopy      left  . Tubal ligation  1995  . [facility]    History   Social History  .  Marital Status: Single    Spouse Name: N/A    Number of Children: N/A  . Years of Education: N/A   Social History Main Topics  . Smoking status: Current Every Day Smoker -- 1.00 packs/day for 22 years    Types: Cigarettes  . Smokeless tobacco: Never Used     Comment: form given 10-27-11/patches are not covered under insurance  . Alcohol Use: No     Comment: sober for 9 months  . Drug Use: No     Comment: did use marijuana, no longer doing this  . Sexual Activity: Yes    Birth Control/ Protection: None   Other Topics Concern  . None   Social History Narrative   Former Acupuncturist patient.      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     Disabled for her bipolar d/o      History of EtOH abuse and THC use.   Family History  Problem Relation Age of  Onset  . Stomach cancer Paternal Grandfather   . Diabetes Maternal Grandfather   . Diabetes Paternal Grandmother   . Heart disease Father   . Lung cancer Father   . Heart disease Mother   . Depression Mother   . Colon polyps Brother   . Kidney disease Maternal Uncle   . Cirrhosis Cousin     alcoholic  . Anxiety disorder Maternal Aunt   . Depression Maternal Aunt             Physical Exam: Healthy-appearing patient in no acute distress.  Blood pressure 118/72, pulse 74 and regular and weight 189 BMI of 30.96.  I cannot appreciate stigmata of chronic liver disease.  Chest is clear and she is in a regular rhythm without murmurs gallops or rubs.  Her abdomen is flat and soft and nontender without organomegaly or masses.  Bowel sounds are normal.  Peripheral extremities are unremarkable mental status is normal     Assessment and Plan: Probable functional abdominal pain in a patient with serious bipolar psychiatric disorder.  We'll repeat her workup with endoscopy and colonoscopy with biopsies for H. pylori, celiac disease, and microscopic colitis.  I placed her on Librax one by mouth 3 times a day with when necessary GI cocktail which seemed to have helped her in the past.  Recent CBC, liver function tests, and hepatitis A, B, and C serologies were negative.  Pregnancy test was negative in September.  CT scan of the abdomen was last performed 5 years ago, was unremarkable, and may need to be repeated.  This is a difficult patient to diagnosis and to manage her problems obviously related to her multiple psychiatric problems and psychiatric medications.

## 2013-07-30 NOTE — Addendum Note (Signed)
Addended by: Ok Anis A on: 07/30/2013 03:58 PM   Modules accepted: Orders

## 2013-07-30 NOTE — Patient Instructions (Addendum)
You have been scheduled for an endoscopy and colonoscopy with propofol. Please follow the written instructions given to you at your visit today. Please pick up your prep at the pharmacy within the next 1-3 days. If you use inhalers (even only as needed), please bring them with you on the day of your procedure. Your physician has requested that you go to www.startemmi.com and enter the access code given to you at your visit today. This web site gives a general overview about your procedure. However, you should still follow specific instructions given to you by our office regarding your preparation for the procedure.  We have sent the following medications to your pharmacy for you to pick up at your convenience: Librax, please take one tablet by mouth three times daily   Called in the GI Cocktail into [facility], please take as directed once daily

## 2013-08-06 ENCOUNTER — Telehealth: Payer: Self-pay | Admitting: Gastroenterology

## 2013-08-06 MED ORDER — NA SULFATE-K SULFATE-MG SULF 17.5-3.13-1.6 GM/177ML PO SOLN: ORAL | Status: DC

## 2013-08-06 NOTE — Telephone Encounter (Signed)
Prep was sent to [facility] to [facility]

## 2013-08-07 ENCOUNTER — Encounter (HOSPITAL_COMMUNITY): Payer: Self-pay | Admitting: Psychiatry

## 2013-08-07 ENCOUNTER — Ambulatory Visit (INDEPENDENT_AMBULATORY_CARE_PROVIDER_SITE_OTHER): Payer: Medicare Other | Admitting: Psychiatry

## 2013-08-07 VITALS — BP 135/88 | HR 88 | Wt 184.0 lb

## 2013-08-07 DIAGNOSIS — F319 Bipolar disorder, unspecified: Secondary | ICD-10-CM | POA: Diagnosis not present

## 2013-08-07 MED ORDER — TRAZODONE HCL 150 MG PO TABS: 150.0000 mg | ORAL_TABLET | Freq: Every day | ORAL | Status: DC

## 2013-08-07 MED ORDER — FLUOXETINE HCL 20 MG PO CAPS: 20.0000 mg | ORAL_CAPSULE | Freq: Every day | ORAL | Status: DC

## 2013-08-07 MED ORDER — LORAZEPAM 1 MG PO TABS: ORAL_TABLET | ORAL | Status: DC

## 2013-08-07 MED ORDER — ZIPRASIDONE HCL 80 MG PO CAPS: ORAL_CAPSULE | ORAL | Status: DC

## 2013-08-07 NOTE — Progress Notes (Signed)
[facility] [address] Progress Note  Sandra Miller [id number] 37 y.o.  08/07/2013 3:32 PM  Chief Complaint  Patient presents with  . Follow-up  . Anxiety  . Medication Refill    History of Present Illness: This came for the appointment.  She continued to have a lot of anxiety and nervousness.  She was very disappointed because her section 8 housing did not go very well.  She was informed that this is a new management and they are not taking any section 8 housing.  She was very upset .  She is taking Geodon 40 mg at bedtime.  Despite recommendation to take a tablet she only takes 40 mg at bedtime.  She is not sleeping very well.  She continued to have anxiety and nervousness.  However she is feeling better because she lost weight from our last visit.  Her blood is also stable.  She continues to have anxiety about her family members especially her daughter who is pregnant.  She had a good Thanksgiving.  She is seeing [facility] Hire for counseling.  She denies any hallucination, paranoia or any suicidal thoughts.  She's not drinking or using any illegal substance.     Suicidal Ideation: No Plan Formed: No Patient has means to carry out plan: No  Homicidal Ideation: No Plan Formed: No Patient has means to carry out plan: No  Review of Systems  Constitutional: Negative.   HENT: Negative.   Musculoskeletal: Positive for back pain.  Skin: Negative.   Psychiatric/Behavioral: Negative.  Negative for suicidal ideas.    Psychiatric: Agitation: No Hallucination: No Depressed Mood: Yes Insomnia: Yes Hypersomnia: No Altered Concentration: No Feels Worthless: Yes Grandiose Ideas: No Belief In Special Powers: No New/Increased Substance Abuse: No Compulsions: No  Neurologic: Headache: No Seizure: No Paresthesias: No  Past history. Patient has history of multiple psychiatric inpatient treatment.  She has history of impulsive behavior aggression and violence paranoia and psychotic  symptoms.    Medical history. Patient has history of hypertension , migraine headache and GERD.    Psychosocial history . Patient lives with her 23 year old son.  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     Outpatient Encounter Prescriptions as of 08/07/2013  Medication Sig  . FLUoxetine (PROZAC) 20 MG capsule Take 1 capsule (20 mg total) by mouth daily.  Marland Kitchen LORazepam (ATIVAN) 1 MG tablet Take 1 at bed time as needed  . metroNIDAZOLE (METROGEL) 0.75 % vaginal gel Place 1 Applicatorful vaginally 2 (two) times daily. For 5 days.  . Multiple Vitamin (MULTIVITAMIN) tablet Take 1 tablet by mouth daily.  . Na Sulfate-K Sulfate-Mg Sulf (SUPREP BOWEL PREP) SOLN Use per prep instructions  . omeprazole (PRILOSEC) 40 MG capsule take 1 capsule by mouth twice a day  . topiramate (TOPAMAX) 25 MG tablet Take 2 tab at bed time  . traZODone (DESYREL) 150 MG tablet Take 1 tablet (150 mg total) by mouth at bedtime.  Marland Kitchen VITAMIN E PO Take 3 capsules by mouth daily.  . ziprasidone (GEODON) 80 MG capsule Take 1 capsule at bed time  . [DISCONTINUED] clidinium-chlordiazePOXIDE (LIBRAX) 5-2.5 MG per capsule Take 1 capsule by mouth 3 (three) times daily before meals.  . [DISCONTINUED] FLUoxetine (PROZAC) 20 MG capsule Take 1 capsule (20 mg total) by mouth daily.  . [DISCONTINUED] LORazepam (ATIVAN) 1 MG tablet Take 1 at bed time as needed  . [DISCONTINUED] OLANZapine (ZYPREXA) 20 MG tablet Take 1 tablet (20 mg total) by  mouth at bedtime.  . [DISCONTINUED] traZODone (DESYREL) 150 MG tablet Take 1 tablet (150 mg total) by mouth at bedtime.  . [DISCONTINUED] ziprasidone (GEODON) 40 MG capsule Take 1-2 capsule at bed time  . AMBULATORY NON FORMULARY MEDICATION Medication Name: GI Cocktail                                 20 cc Maalox                                10 cc Donnatol                                 20 cc Xylocaine    Past Psychiatric History/Hospitalization(s): Patient has been seen in  this office since 2008 on and off.  She has history of bipolar disorder, alcohol dependence and marijuana abuse.  She claims to be sober for past 16 months from drugs and alcohol.  She has history of agitation anger on aggressive behavior.  Patient has multiple hospitalization.  Her first psychiatric hospitalization was at age 51.  She is at [facility] psychiatric hospitalization.  She's been [facility] in [facility] in [facility].  In the past she had tried lithium, Seroquel, Lamictal, Paxil, Celexa, Depakote, Neurontin and Cymbalta with limited response.  Patient is very reluctant to take any medication that cause weight gain.  Patient denies any history of suicidal attempt but admitted history of aggression and violence. Anxiety: Yes Bipolar Disorder: Yes Depression: Yes Mania: Yes Psychosis: Yes Schizophrenia: No Personality Disorder: No Hospitalization for psychiatric illness: Yes History of Electroconvulsive Shock Therapy: No Prior Suicide Attempts: No  Physical Exam: Constitutional:  BP 135/88  Pulse 88  Wt 184 lb (83.462 kg)  Recent Results (from the past 2160 hour(s))  POCT URINALYSIS DIP (DEVICE)     Status: Abnormal   Collection Time    05/15/13  2:31 PM      Result Value Range   Glucose, UA NEGATIVE  NEGATIVE mg/dL   Bilirubin Urine NEGATIVE  NEGATIVE   Ketones, ur NEGATIVE  NEGATIVE mg/dL   Specific Gravity, Urine 1.020  1.005 - 1.030   Hgb urine dipstick SMALL (*) NEGATIVE   pH 6.0  5.0 - 8.0   Protein, ur NEGATIVE  NEGATIVE mg/dL   Urobilinogen, UA 0.2  0.0 - 1.0 mg/dL   Nitrite NEGATIVE  NEGATIVE   Leukocytes, UA NEGATIVE  NEGATIVE   Comment: Biochemical Testing Only. Please order routine urinalysis from main lab if confirmatory testing is needed.  POCT URINALYSIS DIPSTICK     Status: Abnormal   Collection Time    05/29/13  3:03 PM      Result Value Range   Color, UA yellow     Clarity, UA clear     Glucose, UA negative     Bilirubin, UA negative      Ketones, UA negative     Spec Grav, UA <=1.005     Blood, UA moderate     pH, UA 6.0     Protein, UA negative     Urobilinogen, UA 0.2     Nitrite, UA negative     Leukocytes, UA Negative    POCT URINE PREGNANCY     Status: None   Collection Time    05/29/13  3:03 PM      Result Value Range   Preg Test, Ur Negative    POCT WET PREP (WET MOUNT)     Status: Abnormal   Collection Time    05/29/13  3:03 PM      Result Value Range   Source Wet Prep POC VAG     WBC, Wet Prep HPF POC 1-5     Bacteria Wet Prep HPF POC 3+ COCCI & RODS     Clue Cells Wet Prep HPF POC Moderate     CLUE CELLS WET PREP WHIFF POC Negative Whiff     Yeast Wet Prep HPF POC None     Trichomonas Wet Prep HPF POC None    POCT UA - MICROSCOPIC ONLY     Status: None   Collection Time    05/29/13  3:03 PM      Result Value Range   WBC, Ur, HPF, POC RARE     RBC, urine, microscopic OCC     Bacteria, U Microscopic 2+     Epithelial cells, urine per micros 1-5    CBC WITH DIFFERENTIAL     Status: Abnormal   Collection Time    05/29/13  3:32 PM      Result Value Range   WBC 10.8 (*) 4.0 - 10.5 K/uL   RBC 4.54  3.87 - 5.11 MIL/uL   Hemoglobin 13.2  12.0 - 15.0 g/dL   HCT 19.1  47.8 - 29.5 %   MCV 84.8  78.0 - 100.0 fL   MCH 29.1  26.0 - 34.0 pg   MCHC 34.3  30.0 - 36.0 g/dL   RDW 62.1  30.8 - 65.7 %   Platelets 341  150 - 400 K/uL   Neutrophils Relative % 56  43 - 77 %   Neutro Abs 6.1  1.7 - 7.7 K/uL   Lymphocytes Relative 37  12 - 46 %   Lymphs Abs 4.0  0.7 - 4.0 K/uL   Monocytes Relative 5  3 - 12 %   Monocytes Absolute 0.5  0.1 - 1.0 K/uL   Eosinophils Relative 2  0 - 5 %   Eosinophils Absolute 0.2  0.0 - 0.7 K/uL   Basophils Relative 0  0 - 1 %   Basophils Absolute 0.0  0.0 - 0.1 K/uL   Smear Review Criteria for review not met    RPR     Status: None   Collection Time    05/29/13  3:32 PM      Result Value Range   RPR NON REAC  NON REAC  HIV ANTIBODY (ROUTINE TESTING)     Status: None    Collection Time    05/29/13  3:32 PM      Result Value Range   HIV NON REACTIVE  NON REACTIVE  HEPATITIS PANEL, ACUTE     Status: None   Collection Time    05/29/13  3:32 PM      Result Value Range   Hepatitis B Surface Ag NEGATIVE  NEGATIVE   Hep B C IgM NEG  NEGATIVE   Comment: High levels of Hepatitis B Core IgM antibody are detectable     during the acute stage of Hepatitis B. This antibody is used     to differentiate current from past HBV infection.         Hep A IgM NEG  NEGATIVE   HCV Ab NEGATIVE  NEGATIVE    General Appearance: well nourished,  tearful and frustrated.  Musculoskeletal: Strength & Muscle Tone: within normal limits Gait & Station: normal Patient leans: N/A  Psychiatric: Speech (describe rate, volume, coherence, spontaneity, and abnormalities if any): Slow but clear and coherent.  Thought Process (describe rate, content, abstract reasoning, and computation): Slow but logical linear and goal-directed.  Associations: Relevant  Thoughts: normal  Mental Status: Orientation: oriented to person, place, time/date and situation Mood & Affect: depressed affect Attention Span & Concentration: Fair  Medical Decision Making (Choose Three): Established Problem, Stable/Improving (1), Review of Psycho-Social Stressors (1), Review or order clinical lab tests (1), Established Problem, Worsening (2), Review of Last Therapy Session (1), Review of Medication Regimen & Side Effects (2) and Review of New Medication or Change in Dosage (2)  Assessment: Axis I: Bipolar disorder, NOS  Axis II: Deferred  Axis III:  Patient Active Problem List   Diagnosis Date Noted  . Atypical chest pain 04/22/2013  . Vaginal discharge 03/13/2013  . Left knee pain 01/09/2013  . GERD (gastroesophageal reflux disease) 12/26/2012  . Stomach burning 12/26/2012  . Right wrist pain 11/16/2012  . Recurrent boils 09/14/2012  . Tobacco abuse 08/22/2012  . Esophageal dysphagia 10/28/2011   . HELICOBACTER PYLORI GASTRITIS, HX OF 03/20/2009  . HYPERTENSION NEC 03/19/2009  . NEPHROLITHIASIS, HX OF 03/19/2009  . HIATAL HERNIA WITH REFLUX 03/16/2009  . HSV 02/03/2007  . DISORDER, BIPOLAR NOS 02/03/2007  . HX, PERSONAL, GENITAL/OBSTETRIC DISORDER NEC 02/03/2007    Axis IV: Moderate  Axis V: 60-65   Plan:  Recommend to try Geodon 80 mg at bedtime.  Discontinue Zyprexa.  Continue Prozac , trazodone and Topamax at present dose.  Patient was given.  I spent a good amount of time about side effects and benefits of the medication.  Encourage exercise .  Follow up in 4 weeks.  Time spent 25 minutes.  More than 50% of the time spent in psychoeducation, counseling and coordination of care.  Discuss safety plan that anytime having active suicidal thoughts or homicidal thoughts then patient need to call 911 or go to the local emergency room. Cleotis Nipper., MD 08/07/2013

## 2013-08-08 ENCOUNTER — Ambulatory Visit (INDEPENDENT_AMBULATORY_CARE_PROVIDER_SITE_OTHER): Payer: Medicare Other | Admitting: Licensed Clinical Social Worker

## 2013-08-08 DIAGNOSIS — F319 Bipolar disorder, unspecified: Secondary | ICD-10-CM | POA: Diagnosis not present

## 2013-08-08 NOTE — Progress Notes (Signed)
   THERAPIST PROGRESS NOTE  Session Time: 3:00pm-3:50pm  Participation Level: Active  Behavioral Response: Well GroomedLethargicAnxious, Depressed and Irritable  Type of Therapy: Individual Therapy  Treatment Goals addressed: Coping  Interventions: CBT, Strength-based, Supportive and Reframing  Summary: Sandra Miller is a 37 y.o. female who presents with depressed mood and anxious affect. She is tearful and describes increased stress, anxiety and agitation. She reports severe stomach pain and will have an endoscopy tomorrow. She has learned that she must move out of her apartment and this news has overwhelmed her she feels she is unable to function. She has not been to school for two days and is unable to work. Her sleep has increased and her appetite is poor.    Suicidal/Homicidal: Nowithout intent/plan  Therapist Response: Assessed patients current functioning and reviewed progress. Reviewed coping strategies. Assessed patients safety and assisted in identifying protective factors.  Reviewed crisis plan with patient. Assisted patient with the expression of frustration. Reviewed patients self care plan. Assessed progress related to self care. Patients self care is good. Recommend daily exercise, increased socialization and recreation. Used CBT to assist patient with the identification of negative distortions and irrational thoughts. Encouraged patient to verbalize alternative and factual responses which challenge thought distortions. Used DBT to practice mindfulness, review distraction list and improve distress tolerance skills.   Plan: Return again in two weeks.  Diagnosis: Axis I: Bipolar, Depressed    Axis II: No diagnosis    Canyon Willow, LCSW 08/08/2013

## 2013-08-09 ENCOUNTER — Ambulatory Visit (AMBULATORY_SURGERY_CENTER): Payer: Medicare Other | Admitting: Gastroenterology

## 2013-08-09 ENCOUNTER — Encounter: Payer: Self-pay | Admitting: Gastroenterology

## 2013-08-09 VITALS — BP 123/73 | HR 62 | Temp 98.2°F | Resp 21 | Ht 65.5 in | Wt 189.0 lb

## 2013-08-09 DIAGNOSIS — R109 Unspecified abdominal pain: Secondary | ICD-10-CM | POA: Diagnosis not present

## 2013-08-09 DIAGNOSIS — Z1211 Encounter for screening for malignant neoplasm of colon: Secondary | ICD-10-CM

## 2013-08-09 DIAGNOSIS — D1330 Benign neoplasm of unspecified part of small intestine: Secondary | ICD-10-CM

## 2013-08-09 DIAGNOSIS — R197 Diarrhea, unspecified: Secondary | ICD-10-CM

## 2013-08-09 DIAGNOSIS — I10 Essential (primary) hypertension: Secondary | ICD-10-CM | POA: Diagnosis not present

## 2013-08-09 DIAGNOSIS — K219 Gastro-esophageal reflux disease without esophagitis: Secondary | ICD-10-CM

## 2013-08-09 DIAGNOSIS — R0789 Other chest pain: Secondary | ICD-10-CM

## 2013-08-09 DIAGNOSIS — D126 Benign neoplasm of colon, unspecified: Secondary | ICD-10-CM

## 2013-08-09 MED ORDER — DICYCLOMINE HCL 10 MG PO CAPS: 10.0000 mg | ORAL_CAPSULE | Freq: Three times a day (TID) | ORAL | Status: DC

## 2013-08-09 MED ORDER — SODIUM CHLORIDE 0.9 % IV SOLN: 500.0000 mL | INTRAVENOUS | Status: DC

## 2013-08-09 NOTE — Progress Notes (Signed)
Patient did not have preoperative order for IV antibiotic SSI prophylaxis. (G8918)  Patient did not experience any of the following events: a burn prior to discharge; a fall within the facility; wrong site/side/patient/procedure/implant event; or a hospital transfer or hospital admission upon discharge from the facility. (G8907)  

## 2013-08-09 NOTE — Patient Instructions (Signed)
YOU HAD AN ENDOSCOPIC PROCEDURE TODAY AT THE [facility]: Refer to the procedure report that was given to you for any specific questions about what was found during the examination.  If the procedure report does not answer your questions, please call your gastroenterologist to clarify.  If you requested that your care partner not be given the details of your procedure findings, then the procedure report has been included in a sealed envelope for you to review at your convenience later.  YOU SHOULD EXPECT: Some feelings of bloating in the abdomen. Passage of more gas than usual.  Walking can help get rid of the air that was put into your GI tract during the procedure and reduce the bloating. If you had a lower endoscopy (such as a colonoscopy or flexible sigmoidoscopy) you may notice spotting of blood in your stool or on the toilet paper. If you underwent a bowel prep for your procedure, then you may not have a normal bowel movement for a few days.  DIET: Your first meal following the procedure should be a light meal and then it is ok to progress to your normal diet.  A half-sandwich or bowl of soup is an example of a good first meal.  Heavy or fried foods are harder to digest and may make you feel nauseous or bloated.  Likewise meals heavy in dairy and vegetables can cause extra gas to form and this can also increase the bloating.  Drink plenty of fluids but you should avoid alcoholic beverages for 24 hours.  ACTIVITY: Your care partner should take you home directly after the procedure.  You should plan to take it easy, moving slowly for the rest of the day.  You can resume normal activity the day after the procedure however you should NOT DRIVE or use heavy machinery for 24 hours (because of the sedation medicines used during the test).    SYMPTOMS TO REPORT IMMEDIATELY: A gastroenterologist can be reached at any hour.  During normal business hours, 8:30 AM to 5:00 PM Monday through Friday,  call [phone].  After hours and on weekends, please call the GI answering service at [phone] who will take a message and have the physician on call contact you.   Following lower endoscopy (colonoscopy or flexible sigmoidoscopy):  Excessive amounts of blood in the stool  Significant tenderness or worsening of abdominal pains  Swelling of the abdomen that is new, acute  Fever of 100F or higher  Following upper endoscopy (EGD)  Vomiting of blood or coffee ground material  New chest pain or pain under the shoulder blades  Painful or persistently difficult swallowing  New shortness of breath  Fever of 100F or higher  Black, tarry-looking stools  FOLLOW UP: If any biopsies were taken you will be contacted by phone or by letter within the next 1-3 weeks.  Call your gastroenterologist if you have not heard about the biopsies in 3 weeks.  Our staff will call the home number listed on your records the next business day following your procedure to check on you and address any questions or concerns that you may have at that time regarding the information given to you following your procedure. This is a courtesy call and so if there is no answer at the home number and we have not heard from you through the emergency physician on call, we will assume that you have returned to your regular daily activities without incident.  SIGNATURES/CONFIDENTIALITY: You and/or your care   partner have signed paperwork which will be entered into your electronic medical record.  These signatures attest to the fact that that the information above on your After Visit Summary has been reviewed and is understood.  Full responsibility of the confidentiality of this discharge information lies with you and/or your care-partner.  Recommendations See procedure report 

## 2013-08-09 NOTE — Progress Notes (Signed)
Called to room to assist during endoscopic procedure.  Patient ID and intended procedure confirmed with present staff. Received instructions for my participation in the procedure from the performing physician.  

## 2013-08-09 NOTE — Op Note (Signed)
[facility] [address] [place], [address]   ENDOSCOPY PROCEDURE REPORT  PATIENT: Sandra Miller, Sandra Miller  MR#: [id number] BIRTHDATE: 17-Jul-1976 , 37  yrs. old GENDER: Female ENDOSCOPIST:David Hale Bogus, MD, [facility] REFERRED BY: PROCEDURE DATE:  08/09/2013 PROCEDURE:   EGD w/ biopsy and EGD w/ biopsy for H.pylori ASA CLASS:    Class II INDICATIONS: Chest pain and Epigastric pain. MEDICATION: There was residual sedation effect present from prior procedure and Propofol (Diprivan) 200 mg IV TOPICAL ANESTHETIC:   Cetacaine Spray  DESCRIPTION OF PROCEDURE:   After the risks and benefits of the procedure were explained, informed consent was obtained.  The LB [id number]  endoscope was introduced through the mouth  and advanced to the second portion of the duodenum .  The instrument was slowly withdrawn as the mucosa was fully examined.    The upper, middle and distal third of the esophagus were carefully inspected and no abnormalities were noted.  The z-line was well seen at the GEJ.  The endoscope was pushed into the fundus which was normal including a retroflexed view.  The antrum, gastric body, first and second part of the duodenum were unremarkable.Biopsies were done of duodenum and CLO Bx. of antrum.    Retroflexed views revealed no abnormalities.    The scope was then withdrawn from the patient and the procedure completed.  COMPLICATIONS: There were no complications.   ENDOSCOPIC IMPRESSION: Normal EGD...r/o H.pylori,celiac disease vs functional GI issues and IBS.  RECOMMENDATIONS: 1.  Await pathology results 2.  Continue PPI 3.  Follow-up of helicobacter pylori status, treat if indicated 4.  Rx CLO if positive    _______________________________ eSigned:  Mardella Layman, MD, [facility] 08/09/2013 2:58 PM      PATIENT NAME:  [name] MR#: [id number]

## 2013-08-09 NOTE — Op Note (Signed)
[facility] [address] [place], [address]   COLONOSCOPY PROCEDURE REPORT  PATIENT: Sandra, Miller  MR#: [id number] BIRTHDATE: 1976/01/26 , 37  yrs. old GENDER: Female ENDOSCOPIST: Mardella Layman, MD, [facility] REFERRED BY: PROCEDURE DATE:  08/09/2013 PROCEDURE:   Colonoscopy with biopsy First Screening Colonoscopy - Avg.  risk and is 50 yrs.  old or older - No.  Prior Negative Screening - Now for repeat screening. N/A  History of Adenoma - Now for follow-up colonoscopy & has been > or = to 3 yrs.  Yes hx of adenoma.  Has been 3 or more years since last colonoscopy. ASA CLASS:   Class II INDICATIONS:Colorectal cancer screening. MEDICATIONS: propofol (Diprivan) 300mg  IV  DESCRIPTION OF PROCEDURE:   After the risks benefits and alternatives of the procedure were thoroughly explained, informed consent was obtained.  A digital rectal exam revealed no abnormalities of the rectum.   The LB [id number]  endoscope was introduced through the anus and advanced to the cecum, which was identified by both the appendix and ileocecal valve. No adverse events experienced.   The quality of the prep was excellent, using MoviPrep  The instrument was then slowly withdrawn as the colon was fully examined.      COLON FINDINGS: A normal appearing cecum, ileocecal valve, and appendiceal orifice were identified.  The ascending, hepatic flexure, transverse, splenic flexure, descending, sigmoid colon and rectum appeared unremarkable.  No polyps or cancers were seen. Multiple random biopsies of the area were performed.  Retroflexed views revealed no abnormalities. The time to cecum=3 minutes 33 seconds.  Withdrawal time=6 minutes 15 seconds.  The scope was withdrawn and the procedure completed. COMPLICATIONS: There were no complications.  ENDOSCOPIC IMPRESSION: Normal colon; multiple random biopsies of the area were performed ...r/o microscopic/collagenous colitis vs. IBS  diarrhea and functional GI issues.  RECOMMENDATIONS: 1.  Await biopsy results 2.  Continue current medications 3.  Upper endoscopy  eSigned:  Mardella Layman, MD, [facility] 08/09/2013 2:54 PM   cc:

## 2013-08-09 NOTE — Progress Notes (Signed)
Judithann Sauger, CMA told me where the pathology sites were taken and what to rule out. Maw

## 2013-08-12 ENCOUNTER — Encounter: Payer: Self-pay | Admitting: Gastroenterology

## 2013-08-12 ENCOUNTER — Telehealth: Payer: Self-pay | Admitting: *Deleted

## 2013-08-12 LAB — HELICOBACTER PYLORI SCREEN-BIOPSY: UREASE: NEGATIVE

## 2013-08-12 NOTE — Telephone Encounter (Signed)
LM on VM to return call if problems questions or concerns. ewm 

## 2013-08-15 ENCOUNTER — Encounter: Payer: Self-pay | Admitting: Gastroenterology

## 2013-08-16 ENCOUNTER — Other Ambulatory Visit (HOSPITAL_COMMUNITY): Payer: Self-pay | Admitting: Psychiatry

## 2013-08-16 ENCOUNTER — Other Ambulatory Visit: Payer: Self-pay | Admitting: Gastroenterology

## 2013-08-17 ENCOUNTER — Other Ambulatory Visit (HOSPITAL_COMMUNITY): Payer: Self-pay | Admitting: Psychiatry

## 2013-09-02 ENCOUNTER — Telehealth: Payer: Self-pay | Admitting: Family Medicine

## 2013-09-02 MED ORDER — VALACYCLOVIR HCL 500 MG PO TABS: 500.0000 mg | ORAL_TABLET | Freq: Two times a day (BID) | ORAL | Status: DC

## 2013-09-02 NOTE — Telephone Encounter (Signed)
I will send in a medicine but please call pt and let her know that she should schedule a follow up appointment with me to discuss her herpes. Latrelle Dodrill, MD

## 2013-09-02 NOTE — Telephone Encounter (Signed)
Patient requesting Valtrex for genital herpes outbreak.

## 2013-09-02 NOTE — Telephone Encounter (Signed)
Pt notified.  Ewald Beg L, CMA  

## 2013-09-04 ENCOUNTER — Ambulatory Visit: Payer: Self-pay | Admitting: Family Medicine

## 2013-09-11 ENCOUNTER — Ambulatory Visit (INDEPENDENT_AMBULATORY_CARE_PROVIDER_SITE_OTHER): Payer: Medicare Other | Admitting: Psychiatry

## 2013-09-11 ENCOUNTER — Encounter (HOSPITAL_COMMUNITY): Payer: Self-pay | Admitting: Psychiatry

## 2013-09-11 VITALS — BP 152/84 | HR 72 | Wt 184.0 lb

## 2013-09-11 DIAGNOSIS — F319 Bipolar disorder, unspecified: Secondary | ICD-10-CM

## 2013-09-11 MED ORDER — TRAZODONE HCL 100 MG PO TABS: 100.0000 mg | ORAL_TABLET | Freq: Every day | ORAL | Status: DC

## 2013-09-11 MED ORDER — ZIPRASIDONE HCL 80 MG PO CAPS: ORAL_CAPSULE | ORAL | Status: DC

## 2013-09-11 MED ORDER — LORAZEPAM 1 MG PO TABS: ORAL_TABLET | ORAL | Status: DC

## 2013-09-11 MED ORDER — TOPIRAMATE 25 MG PO TABS
ORAL_TABLET | ORAL | Status: DC
Start: 2013-09-11 — End: 2013-11-07

## 2013-09-11 MED ORDER — FLUOXETINE HCL 20 MG PO CAPS: 20.0000 mg | ORAL_CAPSULE | Freq: Every day | ORAL | Status: DC

## 2013-09-11 NOTE — Progress Notes (Signed)
[place] Note  Sandra Miller [id number] 38 y.o.  09/11/2013 3:40 PM  Chief Complaint  Patient presents with  . Follow-up  . Medication Refill    History of Present Illness: Sandra Miller came for the appointment.  She is sleeping good with Geodon 80 mg.  She has cut down her trazodone to half tablet because she was sleeping too much.  She is happy because she lost weight.  She had a good Christmas.  She is anxious about the move .  She denies any paranoia or any hallucination.  Her insight he and nervousness is less intense.  She still anxious about the move and her son who lives part living on his own in one month.  Patient denies any hallucination or any paranoid thinking.  She denies any crying spells.  She is happy because she lost 5 pounds from her last visit.  She likes Geodon.  She has no tremors or shakes.  She is not drinking or using any illegal substances.  She is going to work regularly and like her job.  She had a very good Christmas.  She is not drinking or using any illegal substances.  She is going to move in February to an apartment.  She will live by herself   Suicidal Ideation: No Plan Formed: No Patient has means to carry out plan: No  Homicidal Ideation: No Plan Formed: No Patient has means to carry out plan: No  ROS  Psychiatric: Agitation: No Hallucination: No Depressed Mood: No Insomnia: No Hypersomnia: No Altered Concentration: No Feels Worthless: No Grandiose Ideas: No Belief In Special Powers: No New/Increased Substance Abuse: No Compulsions: No  Neurologic: Headache: No Seizure: No Paresthesias: No  Medical history. Patient has history of hypertension , migraine headache and GERD.    Psychosocial history . Patient lives with her 35 year old son.  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     Outpatient Encounter Prescriptions as of 09/11/2013  Medication Sig  . FLUoxetine (PROZAC) 20 MG capsule Take  1 capsule (20 mg total) by mouth daily.  Marland Kitchen LORazepam (ATIVAN) 1 MG tablet Take 1 at bed time as needed  . topiramate (TOPAMAX) 25 MG tablet take 2 tablet by mouth at bedtime  . traZODone (DESYREL) 100 MG tablet Take 1 tablet (100 mg total) by mouth at bedtime.  . ziprasidone (GEODON) 80 MG capsule Take 1 capsule at bed time  . [DISCONTINUED] FLUoxetine (PROZAC) 20 MG capsule Take 1 capsule (20 mg total) by mouth daily.  . [DISCONTINUED] LORazepam (ATIVAN) 1 MG tablet Take 1 at bed time as needed  . [DISCONTINUED] topiramate (TOPAMAX) 25 MG tablet take 2 tablet by mouth at bedtime  . [DISCONTINUED] traZODone (DESYREL) 150 MG tablet Take 1 tablet (150 mg total) by mouth at bedtime.  . [DISCONTINUED] ziprasidone (GEODON) 80 MG capsule Take 1 capsule at bed time  . AMBULATORY NON FORMULARY MEDICATION Medication Name: GI Cocktail                                 20 cc Maalox                                10 cc Donnatol  20 cc Xylocaine  . dicyclomine (BENTYL) 10 MG capsule Take 1 capsule (10 mg total) by mouth 3 (three) times daily before meals.  . metroNIDAZOLE (METROGEL) 0.75 % vaginal gel Place 1 Applicatorful vaginally 2 (two) times daily. For 5 days.  . Multiple Vitamin (MULTIVITAMIN) tablet Take 1 tablet by mouth daily.  Marland Kitchen omeprazole (PRILOSEC) 40 MG capsule take 1 capsule by mouth twice a day  . valACYclovir (VALTREX) 500 MG tablet Take 1 tablet (500 mg total) by mouth 2 (two) times daily. For 3 days  . VITAMIN E PO Take 3 capsules by mouth daily.    Past Psychiatric History/Hospitalization(s): Patient has been seen in this office since 2008 on and off.  She has history of bipolar disorder, alcohol dependence and marijuana abuse.  She claims to be sober for past 16 months from drugs and alcohol.  She has history of agitation anger on aggressive behavior.  Patient has multiple hospitalization.  Her first psychiatric hospitalization was at age 7.  She is at  [facility].  She's been admitted in behavioral [place] in October 2008.  In the past she had tried lithium, Seroquel, Lamictal, Paxil, Celexa, Depakote, Neurontin and Cymbalta with limited response.  Patient is very reluctant to take any medication that cause weight gain.  Patient denies any history of suicidal attempt but admitted history of aggression and violence. Anxiety: Yes Bipolar Disorder: Yes Depression: Yes Mania: Yes Psychosis: Yes Schizophrenia: No Personality Disorder: No Hospitalization for psychiatric illness: Yes History of Electroconvulsive Shock Therapy: No Prior Suicide Attempts: No  Physical Exam: Constitutional:  BP 152/84  Pulse 72  Wt 184 lb (83.462 kg)  General Appearance: well nourished,   Musculoskeletal: Strength & Muscle Tone: within normal limits Gait & Station: normal Patient leans: N/A  Psychiatric: Speech (describe rate, volume, coherence, spontaneity, and abnormalities if any): Slow but clear and coherent.  Thought Process (describe rate, content, abstract reasoning, and computation): Slow but logical linear and goal-directed.  Associations: Relevant  Thoughts: normal  Mental Status: Orientation: oriented to person, place, time/date and situation Mood & Affect: normal affect and anxiety Attention Span & Concentration: Fair  Medical Decision Making (Choose Three): Established Problem, Stable/Improving (1), Review of Last Therapy Session (1), Review of Medication Regimen & Side Effects (2) and Review of New Medication or Change in Dosage (2)  Assessment: Axis I: Bipolar disorder, NOS  Axis II: Deferred  Axis III:  Patient Active Problem List   Diagnosis Date Noted  . Atypical chest pain 04/22/2013  . Vaginal discharge 03/13/2013  . Left knee pain 01/09/2013  . GERD (gastroesophageal reflux disease) 12/26/2012  . Stomach burning 12/26/2012  . Right wrist pain 11/16/2012  . Recurrent boils 09/14/2012   . Tobacco abuse 08/22/2012  . Esophageal dysphagia 10/28/2011  . HELICOBACTER PYLORI GASTRITIS, HX OF 03/20/2009  . HYPERTENSION NEC 03/19/2009  . NEPHROLITHIASIS, HX OF 03/19/2009  . HIATAL HERNIA WITH REFLUX 03/16/2009  . HSV 02/03/2007  . DISORDER, BIPOLAR NOS 02/03/2007  . HX, PERSONAL, GENITAL/OBSTETRIC DISORDER NEC 02/03/2007    Axis IV: Moderate  Axis V: 60-65   Plan:  Patient is doing better on Geodon.  Recommend to continue Geodon 80 mg at bedtime, Prozac 20 mg daily, lorazepam 1 mg at bedtime, Topamax 50 mg at bedtime.  I will cut down her trazodone to 100 mg suspicion is sleeping better with the Geodon.  Recommend to call us if she has any question or concern.  Recommend continue counseling her coping  skills.  Followup in 2 months.  Leydy Worthey T., MD 09/11/2013

## 2013-09-12 ENCOUNTER — Other Ambulatory Visit: Payer: Self-pay | Admitting: Gastroenterology

## 2013-09-17 ENCOUNTER — Telehealth: Payer: Self-pay | Admitting: Gastroenterology

## 2013-09-17 MED ORDER — OMEPRAZOLE 40 MG PO CPDR: 40.0000 mg | DELAYED_RELEASE_CAPSULE | Freq: Two times a day (BID) | ORAL | Status: DC

## 2013-09-17 NOTE — Telephone Encounter (Signed)
Patient does not need follow up visit until 07-30-2014 was seen 07-30-2013 Old RX sent with needs office visit for further refills RX sent  Patient verbalized understanding

## 2013-09-18 ENCOUNTER — Ambulatory Visit: Payer: Self-pay | Admitting: Family Medicine

## 2013-09-25 ENCOUNTER — Ambulatory Visit (INDEPENDENT_AMBULATORY_CARE_PROVIDER_SITE_OTHER): Payer: Medicare Other | Admitting: Licensed Clinical Social Worker

## 2013-09-25 DIAGNOSIS — F319 Bipolar disorder, unspecified: Secondary | ICD-10-CM | POA: Diagnosis not present

## 2013-09-25 NOTE — Progress Notes (Signed)
   THERAPIST PROGRESS NOTE  Session Time: 2:00pm-2:50pm  Participation Level: Active  Behavioral Response: Well GroomedAlertAnxious and Depressed  Type of Therapy: Individual Therapy  Treatment Goals addressed: Coping  Interventions: CBT, Motivational Interviewing, Strength-based, Supportive and Reframing  Summary: Sandra Miller is a 38 y.o. female who presents with depressed mood and anxious affect. She reports increased stress due to school, her children and her housing. She will have to move and things are not falling into place as she needs them to. She is not sleeping well and does not believe that her medication is working properly. She is overwhelmed and reports panic attacks. Informed patient of this writers departure, processed her treatment and discussed a referral.    Suicidal/Homicidal: Nowithout intent/plan  Therapist Response: Assessed patients current functioning and reviewed progress. Reviewed coping strategies. Assessed patients safety and assisted in identifying protective factors.  Reviewed crisis plan with patient. Assisted patient with the expression of anxiety and frustration . Reviewed patients self care plan. Assessed progress related to self care. Patients self care is fair. Recommend daily exercise, increased socialization and recreation. Used CBT to assist patient with the identification of negative distortions and irrational thoughts. Encouraged patient to verbalize alternative and factual responses which challenge thought distortions. Used motivational interviewing to assist and encourage patient through the change process. Explored patients barriers to change.   Plan: follow up with [facility].   Diagnosis: Axis I: Bipolar, Depressed    Axis II: No diagnosis    Tarance Balan, LCSW 09/25/2013

## 2013-10-09 ENCOUNTER — Encounter: Payer: Self-pay | Admitting: Family Medicine

## 2013-10-09 ENCOUNTER — Other Ambulatory Visit (HOSPITAL_COMMUNITY)
Admission: RE | Admit: 2013-10-09 | Discharge: 2013-10-09 | Disposition: A | Discharge status: Home / Self Care | Payer: Medicare Other | Source: Ambulatory Visit | Attending: Family Medicine | Admitting: Family Medicine

## 2013-10-09 ENCOUNTER — Ambulatory Visit (INDEPENDENT_AMBULATORY_CARE_PROVIDER_SITE_OTHER): Payer: Medicare Other | Admitting: Family Medicine

## 2013-10-09 VITALS — BP 131/78 | HR 76 | Wt 177.0 lb

## 2013-10-09 DIAGNOSIS — N898 Other specified noninflammatory disorders of vagina: Secondary | ICD-10-CM | POA: Diagnosis not present

## 2013-10-09 DIAGNOSIS — Z113 Encounter for screening for infections with a predominantly sexual mode of transmission: Secondary | ICD-10-CM | POA: Diagnosis not present

## 2013-10-09 DIAGNOSIS — A6000 Herpesviral infection of urogenital system, unspecified: Secondary | ICD-10-CM | POA: Diagnosis not present

## 2013-10-09 DIAGNOSIS — B009 Herpesviral infection, unspecified: Secondary | ICD-10-CM

## 2013-10-09 LAB — POCT WET PREP (WET MOUNT): CLUE CELLS WET PREP WHIFF POC: NEGATIVE

## 2013-10-09 MED ORDER — VALACYCLOVIR HCL 500 MG PO TABS: 500.0000 mg | ORAL_TABLET | Freq: Every day | ORAL | Status: DC

## 2013-10-09 MED ORDER — FLUCONAZOLE 150 MG PO TABS: 150.0000 mg | ORAL_TABLET | Freq: Once | ORAL | Status: DC

## 2013-10-09 NOTE — Assessment & Plan Note (Signed)
Due to frequency of outbreaks, will start suppressive therapy with valtrex 500mg  daily. Will check CMET today to ensure labs stable since we will be starting this chronically. Increased outbreaks likely secondary to stress, but will also check HIV antibody to ensure that is not a contributing factor to her increased outbreaks. F/u in 3 months.

## 2013-10-09 NOTE — Progress Notes (Signed)
Patient ID: Sandra Miller, female   DOB: 1976-05-11, 38 y.o.   MRN: [id number]  HPI:  Herpes outbreak: Has had frequent herpes outbreaks over the last few months. She attributes this to stress from school, work, and having recently moved. She says the outbreaks have been essentially like constant flares of HSV. She first contracted HSV at age 14. She was treated over the phone for an outbreak in December with a three day course of Valtrex BID. It initially got better with this treatment but then flared again. She is not currently sexually active. Was last tested for STD's in September. Had only one sexual encounter since that time.   Yeast infection / BV: She also notes that she has frequent BV on a monthly basis for about two weeks, always associated with her menstrual cycle. She has had an endometrial ablation in the past but states the BV comes in a monthly basis. She also thinks she might have yeast right now because she has been itching. She recently treated with an OTC yeast infection medicine but still thinks she has some yeast.  ROS: See HPI  [place]: hx bipolar disorder, under treatment by Dr. [name] Schilder of psychiatry. Very upset because her counselor whom she has seen for years just told her last week that she is leaving. She is in the process of finding a Company secretary.  PHYSICAL EXAM: BP 131/78  Pulse 76  Wt 177 lb (80.287 kg) Gen: NAD, appears stressed but is pleasant and cooperative Lungs: NWOB Neuro: grossly nonfocal, speech intact GU: exam deferred, pt obtained blind wet prep  ASSESSMENT/PLAN:  # Yeast infection: self-obtained wet prep today shows yeast, but no BV. Will treat with diflucan 150mg , repeat in 3 days if not improved. Will obtain urine gc/chlamydia to screen again for STD's.  # Bipolar d/o: Gave handout with list of local counselors since she's having trouble finding a new one. Pt is actively under care of Dr. [name] Schilder of psychiatry.  See problem based charting for  additional assessment/plan.  FOLLOW UP: F/u in 3 months for genital HSV.  [place]. Ardelia Mems, [place]

## 2013-10-09 NOTE — Patient Instructions (Signed)
It was great to see you again today!  See handout below on list of counselors.  For herpes: I sent in a daily dose of valtrex. We are checking some labwork. We will call you with the results.  For yeast: I sent in a medicine. Take one pill once and repeat if needed in 3 days.  Come back in 3 months to follow up, or sooner if you have any problems.  Be well, Dr. Morton Amy:  [facility]   [phone]  Provides information on mental health, intellectual/developmental disabilities & substance abuse services in [place].   COUNSELING AGENCIES (Accepts Medicaid)  [place] [address]        [phone] *[facility] [address]      [phone]  [facility] [place] (I) [facility] [address]"[place]"   [phone] (I) [place] [address]  [phone] [facility] Family [facility] Deputy (I) *[facility] [id number] [phone]   [place] [address] [phone] [facility] for [place]         [phone] (I) *[facility] [address]                 [phone] (I) [place]              [phone] (I) *[facility] [place] [address]    [phone] (I) [facility] [address], [address] [facility] [place]                    [phone] (I)* *[facility] [address]   [phone]  (I) [employer]  * Psychiatric services/servicios psiquiatricos  COUNSELING- CRISIS - 24 hour availability [place]:     [phone] [address], [place], [place] [address]   [facility] of the [facility] [phone] (Domestic Violence, Rape & Victim Assistance )  [place]   [phone] or [phone] [facility] and Crisis Services)  [place]                          Environmental health practitioner Crisis Unit (24/7)             [phone]   [place] National Suicide Hotline    [phone] Di[address] Monks)  [place]   (Only from 8am-4pm)   [phone]

## 2013-10-10 ENCOUNTER — Telehealth: Payer: Self-pay | Admitting: Family Medicine

## 2013-10-10 LAB — COMPREHENSIVE METABOLIC PANEL
ALK PHOS: 59 U/L (ref 39–117)
ALT: 9 U/L (ref 0–35)
AST: 17 U/L (ref 0–37)
Albumin: 4.5 g/dL (ref 3.5–5.2)
BUN: 8 mg/dL (ref 6–23)
CO2: 26 mEq/L (ref 19–32)
Calcium: 9.6 mg/dL (ref 8.4–10.5)
Chloride: 106 mEq/L (ref 96–112)
Creat: 0.87 mg/dL (ref 0.50–1.10)
GLUCOSE: 95 mg/dL (ref 70–99)
POTASSIUM: 3.6 meq/L (ref 3.5–5.3)
Sodium: 140 mEq/L (ref 135–145)
TOTAL PROTEIN: 7.5 g/dL (ref 6.0–8.3)
Total Bilirubin: 0.5 mg/dL (ref 0.2–1.2)

## 2013-10-10 LAB — HIV ANTIBODY (ROUTINE TESTING W REFLEX): HIV: NONREACTIVE

## 2013-10-10 NOTE — Telephone Encounter (Signed)
Attempted phone call.  LMVM.  Please see MD message below.  Sandra Miller, Sandra Miller, Sandra Miller

## 2013-10-10 NOTE — Telephone Encounter (Signed)
[place] red team, please call pt and let her know that her HIV test was negative. She was very nervous about this in her visit yesterday so I want her to know the result ASAP. We will call her separately with the results of her gc/chlamydia testing when it comes back. Thanks!  Leeanne Rio, MD

## 2013-10-10 NOTE — Telephone Encounter (Signed)
When pt returns call please tell her that both HIV and gonorrhea/chlamydia were negative.  Leeanne Rio, MD

## 2013-11-07 ENCOUNTER — Other Ambulatory Visit (HOSPITAL_COMMUNITY): Payer: Self-pay | Admitting: Psychiatry

## 2013-11-07 DIAGNOSIS — F319 Bipolar disorder, unspecified: Secondary | ICD-10-CM

## 2013-11-11 ENCOUNTER — Ambulatory Visit (INDEPENDENT_AMBULATORY_CARE_PROVIDER_SITE_OTHER): Payer: Medicare Other | Admitting: Psychiatry

## 2013-11-11 ENCOUNTER — Encounter (HOSPITAL_COMMUNITY): Payer: Self-pay | Admitting: Psychiatry

## 2013-11-11 VITALS — BP 118/72 | HR 78 | Wt 178.8 lb

## 2013-11-11 DIAGNOSIS — F319 Bipolar disorder, unspecified: Secondary | ICD-10-CM | POA: Diagnosis not present

## 2013-11-11 MED ORDER — TOPIRAMATE 25 MG PO TABS: ORAL_TABLET | ORAL | Status: DC

## 2013-11-11 MED ORDER — ZIPRASIDONE HCL 80 MG PO CAPS: ORAL_CAPSULE | ORAL | Status: DC

## 2013-11-11 MED ORDER — FLUOXETINE HCL 10 MG PO CAPS: ORAL_CAPSULE | ORAL | Status: DC

## 2013-11-11 MED ORDER — TRAZODONE HCL 100 MG PO TABS: 100.0000 mg | ORAL_TABLET | Freq: Every day | ORAL | Status: DC

## 2013-11-11 NOTE — Progress Notes (Signed)
[facility] [id number] Note  Sandra Miller [id number] 38 y.o.  11/11/2013 3:21 PM  Chief Complaint  Patient presents with  . Follow-up  . Medication Refill  . Stress  . Anxiety  . Depression    History of Present Illness: Sandra Miller came for the appointment.  She is complaining of increased stress , anxiety and depression.  She moved to new place 3 weeks ago and she is not happy because she is living by herself.  She misses her children and beer this is the first time she is living by herself.  She endorsed lack of motivation, decreased energy, crying spells .  She's not going to work and she has not going to school until today.  She also struggling with counseling.  Her therapist left and she is unable to see counselor at [facility] [employer].  Her weight and blood pressure is unchanged from the past.  She denies any paranoia or any hallucination.  Patient told that her family is concerned that she may relapse into drinking but she has been sober for more than 2 years.  Patient endorsed that she will not drink alcohol because she does not like anymore drinking but she admitted feeling more isolated withdrawn and lack of energy.  She is taking her medication regularly.  She denies any side effects of medication.  She is taking Geodon, Ativan, trazodone and Prozac.     Suicidal Ideation: No Plan Formed: No Patient has means to carry out plan: No  Homicidal Ideation: No Plan Formed: No Patient has means to carry out plan: No  Review of Systems  Constitutional: Positive for malaise/fatigue.  Neurological: Positive for headaches.  Psychiatric/Behavioral: Positive for depression.    Psychiatric: Agitation: No Hallucination: No Depressed Mood: Yes Insomnia: Yes Hypersomnia: No Altered Concentration: No Feels Worthless: Yes Grandiose Ideas: No Belief In Special Powers: No New/Increased Substance Abuse: No Compulsions: No  Neurologic: Headache: Yes Seizure: No Paresthesias:  No  Medical history. Patient has history of hypertension , migraine headache and GERD.    Psychosocial history . Patient lives with her 77 year old son.  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     Outpatient Encounter Prescriptions as of 11/11/2013  Medication Sig  . FLUoxetine (PROZAC) 10 MG capsule Take 2 capsule  . LORazepam (ATIVAN) 1 MG tablet Take 1 at bed time as needed  . topiramate (TOPAMAX) 25 MG tablet TAKE 2 TABLETS AT BEDTIME.  . ziprasidone (GEODON) 80 MG capsule Take 1 capsule at bed time  . [DISCONTINUED] FLUoxetine (PROZAC) 20 MG capsule Take 1 capsule (20 mg total) by mouth daily.  . [DISCONTINUED] topiramate (TOPAMAX) 25 MG tablet TAKE 2 TABLETS AT BEDTIME.  . [DISCONTINUED] ziprasidone (GEODON) 80 MG capsule Take 1 capsule at bed time  . AMBULATORY NON FORMULARY MEDICATION Medication Name: GI Cocktail                                 20 cc Maalox                                10 cc Donnatol                                 20 cc Xylocaine  . dicyclomine (BENTYL) 10 MG capsule  Take 1 capsule (10 mg total) by mouth 3 (three) times daily before meals.  . fluconazole (DIFLUCAN) 150 MG tablet Take 1 tablet (150 mg total) by mouth once. Repeat in 3 days if not better.  . metroNIDAZOLE (METROGEL) 0.75 % vaginal gel Place 1 Applicatorful vaginally 2 (two) times daily. For 5 days.  . Multiple Vitamin (MULTIVITAMIN) tablet Take 1 tablet by mouth daily.  Marland Kitchen omeprazole (PRILOSEC) 40 MG capsule Take 1 capsule (40 mg total) by mouth 2 (two) times daily.  . traZODone (DESYREL) 100 MG tablet Take 1 tablet (100 mg total) by mouth at bedtime.  . valACYclovir (VALTREX) 500 MG tablet Take 1 tablet (500 mg total) by mouth daily.  Marland Kitchen VITAMIN E PO Take 3 capsules by mouth daily.  . [DISCONTINUED] traZODone (DESYREL) 100 MG tablet Take 1 tablet (100 mg total) by mouth at bedtime.    Past Psychiatric History/Hospitalization(s): Patient has been seen in this office since  2008 on and off.  She has history of bipolar disorder, alcohol dependence and marijuana abuse.  She claims to be sober for past 16 months from drugs and alcohol.  She has history of agitation anger on aggressive behavior.  Patient has multiple hospitalization.  Her first psychiatric hospitalization was at age 65.  She is at [facility] psychiatric hospitalization.  She's been admitted in behavioral [place] in October 2008.  In the past she had tried lithium, Seroquel, Lamictal, Paxil, Celexa, Depakote, Neurontin and Cymbalta with limited response.  Patient is very reluctant to take any medication that cause weight gain.  Patient denies any history of suicidal attempt but admitted history of aggression and violence. Anxiety: Yes Bipolar Disorder: Yes Depression: Yes Mania: Yes Psychosis: Yes Schizophrenia: No Personality Disorder: No Hospitalization for psychiatric illness: Yes History of Electroconvulsive Shock Therapy: No Prior Suicide Attempts: No  Physical Exam: Constitutional:  BP 118/72  Pulse 78  Wt 178 lb 12.8 oz (81.103 kg)  General Appearance: well nourished,   Musculoskeletal: Strength & Muscle Tone: within normal limits Gait & Station: normal Patient leans: N/A  Mental status examination Patient is casually dressed and fairly groomed.  She appears very anxious, tearful and emotional.  Her speech is slow but clear and coherent.  She describes her mood as sad and depressed and her affect is constricted.  She has no tremors or shakes.  She denies any auditory or visual hallucination.  She denies any active or passive suicidal thoughts or homicidal thoughts.  She admitted irritability and anger but denies any obsessive thoughts.  She denies any paranoia or any delusions.  Her fund of knowledge is adequate.  Her memory is intact.  Her psychomotor activity is slightly slow.  She has no tremors or shakes.  She is alert and oriented x3.  Her insight judgment and impulse control is  okay.  Established Problem, Stable/Improving (1), Review of Psycho-Social Stressors (1), Established Problem, Worsening (2), Review of Last Therapy Session (1), Review of Medication Regimen & Side Effects (2) and Review of New Medication or Change in Dosage (2)  Assessment: Axis I: Bipolar disorder, NOS  Axis II: Deferred  Axis III:  Patient Active Problem List   Diagnosis Date Noted  . Atypical chest pain 04/22/2013  . Vaginal discharge 03/13/2013  . Left knee pain 01/09/2013  . GERD (gastroesophageal reflux disease) 12/26/2012  . Stomach burning 12/26/2012  . Right wrist pain 11/16/2012  . Recurrent boils 09/14/2012  . Tobacco abuse 08/22/2012  . Esophageal dysphagia 10/28/2011  .  HELICOBACTER PYLORI GASTRITIS, HX OF 03/20/2009  . HYPERTENSION NEC 03/19/2009  . NEPHROLITHIASIS, HX OF 03/19/2009  . HIATAL HERNIA WITH REFLUX 03/16/2009  . HSV 02/03/2007  . DISORDER, BIPOLAR NOS 02/03/2007  . HX, PERSONAL, GENITAL/OBSTETRIC DISORDER NEC 02/03/2007    Axis IV: Moderate  Axis V: 60-65   Plan:  I reviewed her stressors, psychosocial issues and her current medication.  I will increase her Prozac to 30 mg.  Continue Ativan, Geodon and trazodone at present dose.  Continue Topamax at present dose.  Patient to require counseling.  She has noticed increased depression since she moved into a new place and she is unable to see a therapist.  I will schedule appointment with Jonnie in this office.  Recommended to call us back if she has any question or any concern.  Followup in 4 weeks.Time spent 25 minutes.  More than 50% of the time spent in psychoeducation, counseling and coordination of care.  Discuss safety plan that anytime having active suicidal thoughts or homicidal thoughts then patient need to call 911 or go to the local emergency room.  Avrohom Mckelvin T., MD 11/11/2013

## 2013-11-15 ENCOUNTER — Ambulatory Visit: Payer: Self-pay | Admitting: Family Medicine

## 2013-11-25 ENCOUNTER — Emergency Department (HOSPITAL_COMMUNITY): Payer: Medicare Other

## 2013-11-25 ENCOUNTER — Encounter (HOSPITAL_COMMUNITY): Payer: Self-pay | Admitting: Emergency Medicine

## 2013-11-25 ENCOUNTER — Emergency Department (HOSPITAL_COMMUNITY)
Admission: EM | Admit: 2013-11-25 | Discharge: 2013-11-25 | Disposition: A | Discharge status: Home / Self Care | Payer: Medicare Other | Attending: Emergency Medicine | Admitting: Emergency Medicine

## 2013-11-25 DIAGNOSIS — F319 Bipolar disorder, unspecified: Secondary | ICD-10-CM | POA: Insufficient documentation

## 2013-11-25 DIAGNOSIS — F17200 Nicotine dependence, unspecified, uncomplicated: Secondary | ICD-10-CM | POA: Diagnosis not present

## 2013-11-25 DIAGNOSIS — K219 Gastro-esophageal reflux disease without esophagitis: Secondary | ICD-10-CM | POA: Diagnosis not present

## 2013-11-25 DIAGNOSIS — Z87442 Personal history of urinary calculi: Secondary | ICD-10-CM | POA: Diagnosis not present

## 2013-11-25 DIAGNOSIS — Z79899 Other long term (current) drug therapy: Secondary | ICD-10-CM | POA: Diagnosis not present

## 2013-11-25 DIAGNOSIS — I10 Essential (primary) hypertension: Secondary | ICD-10-CM | POA: Diagnosis not present

## 2013-11-25 DIAGNOSIS — F411 Generalized anxiety disorder: Secondary | ICD-10-CM | POA: Diagnosis not present

## 2013-11-25 DIAGNOSIS — R079 Chest pain, unspecified: Secondary | ICD-10-CM | POA: Diagnosis not present

## 2013-11-25 DIAGNOSIS — Z8619 Personal history of other infectious and parasitic diseases: Secondary | ICD-10-CM | POA: Insufficient documentation

## 2013-11-25 LAB — URINALYSIS, ROUTINE W REFLEX MICROSCOPIC
Bilirubin Urine: NEGATIVE
Glucose, UA: NEGATIVE mg/dL
HGB URINE DIPSTICK: NEGATIVE
Ketones, ur: NEGATIVE mg/dL
Leukocytes, UA: NEGATIVE
Nitrite: NEGATIVE
PROTEIN: NEGATIVE mg/dL
Specific Gravity, Urine: 1.007 (ref 1.005–1.030)
UROBILINOGEN UA: 0.2 mg/dL (ref 0.0–1.0)
pH: 6.5 (ref 5.0–8.0)

## 2013-11-25 LAB — CBC WITH DIFFERENTIAL/PLATELET
BASOS ABS: 0 10*3/uL (ref 0.0–0.1)
BASOS PCT: 0 % (ref 0–1)
EOS ABS: 0.1 10*3/uL (ref 0.0–0.7)
Eosinophils Relative: 1 % (ref 0–5)
HCT: 36.6 % (ref 36.0–46.0)
Hemoglobin: 12.7 g/dL (ref 12.0–15.0)
Lymphocytes Relative: 29 % (ref 12–46)
Lymphs Abs: 3.2 10*3/uL (ref 0.7–4.0)
MCH: 29.5 pg (ref 26.0–34.0)
MCHC: 34.7 g/dL (ref 30.0–36.0)
MCV: 84.9 fL (ref 78.0–100.0)
Monocytes Absolute: 0.5 10*3/uL (ref 0.1–1.0)
Monocytes Relative: 5 % (ref 3–12)
Neutro Abs: 7.1 10*3/uL (ref 1.7–7.7)
Neutrophils Relative %: 65 % (ref 43–77)
PLATELETS: 283 10*3/uL (ref 150–400)
RBC: 4.31 MIL/uL (ref 3.87–5.11)
RDW: 13.9 % (ref 11.5–15.5)
WBC: 11 10*3/uL — ABNORMAL HIGH (ref 4.0–10.5)

## 2013-11-25 LAB — BASIC METABOLIC PANEL
BUN: 10 mg/dL (ref 6–23)
CO2: 22 mEq/L (ref 19–32)
Calcium: 9.3 mg/dL (ref 8.4–10.5)
Chloride: 107 mEq/L (ref 96–112)
Creatinine, Ser: 0.83 mg/dL (ref 0.50–1.10)
GFR calc non Af Amer: 88 mL/min — ABNORMAL LOW (ref >90)
Glucose, Bld: 100 mg/dL — ABNORMAL HIGH (ref 70–99)
POTASSIUM: 3.9 meq/L (ref 3.7–5.3)
Sodium: 142 mEq/L (ref 137–147)

## 2013-11-25 LAB — TROPONIN I

## 2013-11-25 MED ORDER — ACETAMINOPHEN 325 MG PO TABS: 650.0000 mg | ORAL_TABLET | Freq: Four times a day (QID) | ORAL | Status: DC | PRN

## 2013-11-25 MED ORDER — MORPHINE SULFATE 4 MG/ML IJ SOLN
4.0000 mg | Freq: Once | INTRAMUSCULAR | Status: DC
  Filled 2013-11-25: qty 1

## 2013-11-25 NOTE — ED Notes (Signed)
Pt reports having sudden onset right side chest pressure around 2pm while she was studying. States that she started getting anxious and took ativan 1mg  to help calm her down. EMS called and brought pt to the ER for further evaluation. Pt rated chest pressure 10/10 when it first started and now rates it 5/10 post ativan.

## 2013-11-25 NOTE — Discharge Instructions (Signed)
We saw you in the ER for the chest pain/shortness of breath. °All of our cardiac workup is normal, including labs, EKG and chest X-RAY are normal. °We are not sure what is causing your discomfort, but we feel comfortable sending you home at this time. The workup in the ER is not complete, and you should follow up with your primary care doctor for further evaluation. ° ° °Chest Pain (Nonspecific) °It is often hard to give a specific diagnosis for the cause of chest pain. There is always a chance that your pain could be related to something serious, such as a heart attack or a blood clot in the lungs. You need to follow up with your caregiver for further evaluation. °CAUSES  °· Heartburn. °· Pneumonia or bronchitis. °· Anxiety or stress. °· Inflammation around your heart (pericarditis) or lung (pleuritis or pleurisy). °· A blood clot in the lung. °· A collapsed lung (pneumothorax). It can develop suddenly on its own (spontaneous pneumothorax) or from injury (trauma) to the chest. °· Shingles infection (herpes zoster virus). °The chest wall is composed of bones, muscles, and cartilage. Any of these can be the source of the pain. °· The bones can be bruised by injury. °· The muscles or cartilage can be strained by coughing or overwork. °· The cartilage can be affected by inflammation and become sore (costochondritis). °DIAGNOSIS  °Lab tests or other studies, such as X-rays, electrocardiography, stress testing, or cardiac imaging, may be needed to find the cause of your pain.  °TREATMENT  °· Treatment depends on what may be causing your chest pain. Treatment may include: °· Acid blockers for heartburn. °· Anti-inflammatory medicine. °· Pain medicine for inflammatory conditions. °· Antibiotics if an infection is present. °· You may be advised to change lifestyle habits. This includes stopping smoking and avoiding alcohol, caffeine, and chocolate. °· You may be advised to keep your head raised (elevated) when sleeping.  This reduces the chance of acid going backward from your stomach into your esophagus. °· Most of the time, nonspecific chest pain will improve within 2 to 3 days with rest and mild pain medicine. °HOME CARE INSTRUCTIONS  °· If antibiotics were prescribed, take your antibiotics as directed. Finish them even if you start to feel better. °· For the next few days, avoid physical activities that bring on chest pain. Continue physical activities as directed. °· Do not smoke. °· Avoid drinking alcohol. °· Only take over-the-counter or prescription medicine for pain, discomfort, or fever as directed by your caregiver. °· Follow your caregiver's suggestions for further testing if your chest pain does not go away. °· Keep any follow-up appointments you made. If you do not go to an appointment, you could develop lasting (chronic) problems with pain. If there is any problem keeping an appointment, you must call to reschedule. °SEEK MEDICAL CARE IF:  °· You think you are having problems from the medicine you are taking. Read your medicine instructions carefully. °· Your chest pain does not go away, even after treatment. °· You develop a rash with blisters on your chest. °SEEK IMMEDIATE MEDICAL CARE IF:  °· You have increased chest pain or pain that spreads to your arm, neck, jaw, back, or abdomen. °· You develop shortness of breath, an increasing cough, or you are coughing up blood. °· You have severe back or abdominal pain, feel nauseous, or vomit. °· You develop severe weakness, fainting, or chills. °· You have a fever. °THIS IS AN EMERGENCY. Do not wait to   see if the pain will go away. Get medical help at once. Call your local emergency services (911 in [place] Do not drive yourself to the hospital. °MAKE SURE YOU:  °· Understand these instructions. °· Will watch your condition. °· Will get help right away if you are not doing well or get worse. °Document Released: 06/01/2005 Document Revised: 11/14/2011 Document Reviewed:  03/27/2008 °[employer]® Patient Information ©2014 [employer]. ° °

## 2013-11-25 NOTE — ED Notes (Signed)
Patient transported to X-ray 

## 2013-11-25 NOTE — ED Notes (Addendum)
Pt refused morphine. States that she is worried that medication will make her feel "weird".

## 2013-11-25 NOTE — ED Notes (Signed)
Pt arrived via EMS c/o cp starting in right chest radiated to left chest started while doing homework.. States sob due to anxiety attack associated with concern of chest pain. States cp did not cause sob. Pt did admit to diaphoresis and nausea with pain. Pt is intermittent at this time without intervention.

## 2013-11-27 NOTE — ED Provider Notes (Signed)
CSN: [id number]     Arrival date & time 11/25/13  1538 History   First MD Initiated Contact with Patient 11/25/13 1549     Chief Complaint  Patient presents with  . Chest Pain     (Consider location/radiation/quality/duration/timing/severity/associated sxs/prior Treatment) HPI Comments: Pt comes in to the ER with cc of chest pain. Pt has hx of anxiety, HTN, no substance abuse, No CAD, no family hx of premature CAD. She reports starting to have chest pain while she was doing her homework. She has hx of anxiety, and since the pain wasn't going away, she started getting short of breath, and panicking. Pt's pain is intermittent now. Located right side, radiates left wards, no posterior radiation. No nausea, diaphoresis. Pt feels a lot better right now. she denies any cough, uri like sx, no hx of PE, DVT - and no risk factors for the same.  Patient is a 38 y.o. female presenting with chest pain. The history is provided by the patient.  Chest Pain Associated symptoms: no abdominal pain, no headache, no nausea, no shortness of breath and not vomiting     Past Medical History  Diagnosis Date  . Anxiety   . Hypertension     history of  . GERD (gastroesophageal reflux disease)   . Hiatal hernia   . HSV   . DISORDER, BIPOLAR NOS   . ALCOHOLISM     history of  . DISORDER, TOBACCO USE   . SUBSTANCE ABUSE     history of  . CONSTIPATION   . Abdominal pain, unspecified site   . EPIGASTRIC PAIN   . HELICOBACTER PYLORI GASTRITIS, HX OF   . NEPHROLITHIASIS, HX OF   . HX, PERSONAL, GENITAL/OBSTETRIC DISORDER NEC   . Esophageal dysphagia    Past Surgical History  Procedure Laterality Date  . Cesarean section  1995  . Knee arthroscopy      left  . Tubal ligation  1995  . [facility]    Family History  Problem Relation Age of Onset  . Stomach cancer Paternal Grandfather   . Diabetes Maternal Grandfather   . Diabetes Paternal Grandmother   . Heart disease  Father   . Lung cancer Father   . Heart disease Mother   . Depression Mother   . Colon polyps Brother   . Kidney disease Maternal Uncle   . Cirrhosis Cousin     alcoholic  . Anxiety disorder Maternal Aunt   . Depression Maternal Aunt    History  Substance Use Topics  . Smoking status: Current Every Day Smoker -- 1.00 packs/day for 22 years    Types: Cigarettes  . Smokeless tobacco: Never Used     Comment: form given 10-27-11/patches are not covered under insurance  . Alcohol Use: No     Comment: sober for 9 months   OB History   Grav Para Term Preterm Abortions TAB SAB Ect Mult Living                 Review of Systems  Constitutional: Negative for activity change.  Respiratory: Negative for shortness of breath.   Cardiovascular: Positive for chest pain.  Gastrointestinal: Negative for nausea, vomiting and abdominal pain.  Genitourinary: Negative for dysuria.  Musculoskeletal: Negative for neck pain.  Neurological: Negative for headaches.  All other systems reviewed and are negative.      Allergies  Aspirin and Minocycline  Home Medications   Current Outpatient Rx  Name  Route  Sig  Dispense  Refill  . FLUoxetine (PROZAC) 10 MG capsule   Oral   Take 30 mg by mouth daily.         Marland Kitchen LORazepam (ATIVAN) 1 MG tablet   Oral   Take 1 mg by mouth 3 times/day as needed-between meals & bedtime for anxiety.         . Multiple Vitamin (MULTIVITAMIN) tablet   Oral   Take 1 tablet by mouth daily.         Marland Kitchen omeprazole (PRILOSEC) 40 MG capsule   Oral   Take 1 capsule (40 mg total) by mouth 2 (two) times daily.   60 capsule   6   . topiramate (TOPAMAX) 25 MG tablet   Oral   Take 50 mg by mouth at bedtime.         . traZODone (DESYREL) 100 MG tablet   Oral   Take 1 tablet (100 mg total) by mouth at bedtime.   30 tablet   1   . valACYclovir (VALTREX) 500 MG tablet   Oral   Take 1 tablet (500 mg total) by mouth daily.   30 tablet   2   . ziprasidone  (GEODON) 80 MG capsule      Take 1 capsule at bed time   30 capsule   1   . acetaminophen (TYLENOL) 325 MG tablet   Oral   Take 2 tablets (650 mg total) by mouth every 6 (six) hours as needed.   30 tablet   0    BP 119/76  Pulse 66  Temp(Src) 98.7 F (37.1 C) (Oral)  Resp 21  Ht 5\' 5"  (1.651 m)  Wt 176 lb (79.833 kg)  BMI 29.29 kg/m2  SpO2 99% Physical Exam  Nursing note and vitals reviewed. Constitutional: She is oriented to person, place, and time. She appears well-developed and well-nourished.  HENT:  Head: Normocephalic and atraumatic.  Eyes: EOM are normal. Pupils are equal, round, and reactive to light.  Neck: Neck supple.  Cardiovascular: Normal rate, regular rhythm and normal heart sounds.   No murmur heard. Pulmonary/Chest: Effort normal. No respiratory distress.  Abdominal: Soft. She exhibits no distension. There is no tenderness. There is no rebound and no guarding.  Neurological: She is alert and oriented to person, place, and time.  Skin: Skin is warm and dry.    ED Course  Procedures (including critical care time) Labs Review Labs Reviewed  CBC WITH DIFFERENTIAL - Abnormal; Notable for the following:    WBC 11.0 (*)    All other components within normal limits  BASIC METABOLIC PANEL - Abnormal; Notable for the following:    Glucose, Bld 100 (*)    GFR calc non Af Amer 88 (*)    All other components within normal limits  TROPONIN I  URINALYSIS, ROUTINE W REFLEX MICROSCOPIC   Imaging Review No results found.   EKG Interpretation   Date/Time:  Monday November 25 2013 15:44:10 EDT Ventricular Rate:  78 PR Interval:  166 QRS Duration: 112 QT Interval:  440 QTC Calculation: 501 R Axis:   44 Text Interpretation:  Sinus rhythm Borderline intraventricular conduction  delay Borderline prolonged QT interval Confirmed by Kathrynn Humble, MD, Thelma Comp  [phone]) on 11/25/2013 4:18:15 PM      MDM   Final diagnoses:  Chest pain    Differential diagnosis  includes: ACS syndrome CHF exacerbation Valvular disorder Myocarditis Pericarditis Pericardial effusion Pneumonia Pleural effusion Pulmonary edema PE Anemia  Musculoskeletal pain Panic attack  Pt comes in with atypical chest pain - which eventually precipitated into a panic attack. Exam is benign, she feels a lot better. CAD Risk factors  - smoking, HTN. Her pulse exam is normal.  dont suspect an emergent cause for Chest pain from the ddx above. Stable for discharge.   Varney Biles, MD 11/27/13 [phone]

## 2013-12-10 ENCOUNTER — Ambulatory Visit (INDEPENDENT_AMBULATORY_CARE_PROVIDER_SITE_OTHER): Payer: Medicare Other | Admitting: Marriage and Family Therapist

## 2013-12-10 DIAGNOSIS — F319 Bipolar disorder, unspecified: Secondary | ICD-10-CM | POA: Diagnosis not present
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
## 2013-12-17 ENCOUNTER — Encounter (HOSPITAL_COMMUNITY): Payer: Self-pay | Admitting: Psychiatry

## 2013-12-17 ENCOUNTER — Ambulatory Visit (INDEPENDENT_AMBULATORY_CARE_PROVIDER_SITE_OTHER): Payer: Medicare Other | Admitting: Psychiatry

## 2013-12-17 VITALS — BP 125/74 | HR 70 | Ht 65.5 in | Wt 179.2 lb

## 2013-12-17 DIAGNOSIS — F319 Bipolar disorder, unspecified: Secondary | ICD-10-CM

## 2013-12-17 MED ORDER — LORAZEPAM 1 MG PO TABS: 1.0000 mg | ORAL_TABLET | Freq: Every day | ORAL | Status: DC

## 2013-12-17 MED ORDER — BUPROPION HCL 75 MG PO TABS: 75.0000 mg | ORAL_TABLET | Freq: Two times a day (BID) | ORAL | Status: DC

## 2013-12-17 NOTE — Progress Notes (Signed)
[name] [id number] 38 y.o.  12/17/2013 3:20 PM  Chief Complaint  Patient presents with  . Depression  . Follow-up  . Medication Refill  . Anxiety    History of Present Illness: Raeven came for the appointment.  She continues to feel depressed sad and lack of motivation.  She continues to have crying spells .  On her last visit we increased Prozac 30 mg but does not see any improvement.  She is seeing therapist for the first time after her previous therapist left the practice.  She continued to feel boredom , lonely and sad because her children moved out.  However she continues to keep in touch with them.  She admitted sometime feeling hopeless, decreased energy and having anxiety spells.  She was recently seen in the emergency room believed that she has a heart attack however she was told that she has panic attack.  Her blood pressure is better.  She was not given any new medication.  Patient started school but feels sometime not able to pay attention and concentration extremities.  She denies any active or passive suicidal thoughts and homicidal thoughts but endorsed lack of coordination to do things.  She denies any hallucination or any paranoia.  She is compliant the Geodon and Ativan.  She is not drinking or using any illegal substances.  She denies any anger or any aggression.  She is taking trazodone which is helping her sleep.   Suicidal Ideation: No Plan Formed: No Patient has means to carry out plan: No  Homicidal Ideation: No Plan Formed: No Patient has means to carry out plan: No  Review of Systems  Constitutional: Positive for malaise/fatigue.  Psychiatric/Behavioral: Positive for depression.    Psychiatric: Agitation: No Hallucination: No Depressed Mood: Yes Insomnia: Yes Hypersomnia: No Altered Concentration: No Feels Worthless: Yes Grandiose Ideas: No Belief In Special Powers: No New/Increased Substance Abuse:  No Compulsions: No  Neurologic: Headache: Yes Seizure: No Paresthesias: No  Medical history. Patient has history of hypertension , migraine headache and GERD.    Psychosocial history . Patient lives with her 57 year old son.  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     Outpatient Encounter Prescriptions as of 12/17/2013  Medication Sig  . acetaminophen (TYLENOL) 325 MG tablet Take 2 tablets (650 mg total) by mouth every 6 (six) hours as needed.  Marland Kitchen LORazepam (ATIVAN) 1 MG tablet Take 1 tablet (1 mg total) by mouth at bedtime.  . Multiple Vitamin (MULTIVITAMIN) tablet Take 1 tablet by mouth daily.  Marland Kitchen omeprazole (PRILOSEC) 40 MG capsule Take 1 capsule (40 mg total) by mouth 2 (two) times daily.  Marland Kitchen topiramate (TOPAMAX) 25 MG tablet Take 50 mg by mouth at bedtime.  . traZODone (DESYREL) 100 MG tablet Take 1 tablet (100 mg total) by mouth at bedtime.  . ziprasidone (GEODON) 80 MG capsule Take 1 capsule at bed time  . [DISCONTINUED] FLUoxetine (PROZAC) 10 MG capsule Take 30 mg by mouth daily.  . [DISCONTINUED] LORazepam (ATIVAN) 1 MG tablet Take 1 mg by mouth 3 times/day as needed-between meals & bedtime for anxiety.  Marland Kitchen buPROPion (WELLBUTRIN) 75 MG tablet Take 1 tablet (75 mg total) by mouth 2 (two) times daily.  . valACYclovir (VALTREX) 500 MG tablet Take 1 tablet (500 mg total) by mouth daily.    Past Psychiatric History/Hospitalization(s): Patient has been seen in this office since 2008 on and off.  She has  history of bipolar disorder, alcohol dependence and marijuana abuse.  She claims to be sober for past 16 months from drugs and alcohol.  She has history of agitation anger on aggressive behavior.  Patient has multiple hospitalization.  Her first psychiatric hospitalization was at age 66.  She is at [facility] psychiatric hospitalization.  She's been admitted in behavioral [place] in October 2008.  In the past she had tried lithium, Seroquel, Lamictal, Paxil,  Celexa, Depakote, Neurontin and Cymbalta with limited response.  Patient is very reluctant to take any medication that cause weight gain.  Patient denies any history of suicidal attempt but admitted history of aggression and violence. Anxiety: Yes Bipolar Disorder: Yes Depression: Yes Mania: Yes Psychosis: Yes Schizophrenia: No Personality Disorder: No Hospitalization for psychiatric illness: Yes History of Electroconvulsive Shock Therapy: No Prior Suicide Attempts: No  Physical Exam: Constitutional:  BP 125/74  Pulse 70  Ht 5' 5.5" (1.664 m)  Wt 179 lb 3.2 oz (81.285 kg)  BMI 29.36 kg/m2  General Appearance: well nourished,  Recent Results (from the past 2160 hour(s))  POCT WET PREP (WET MOUNT)     Status: Abnormal   Collection Time    10/09/13  3:30 PM      Result Value Ref Range   Source Wet Prep POC VAG     WBC, Wet Prep HPF POC OCCASIONAL     Bacteria Wet Prep HPF POC 3+ RODS     Clue Cells Wet Prep HPF POC None     CLUE CELLS WET PREP WHIFF POC Negative Whiff     Yeast Wet Prep HPF POC Moderate     Trichomonas Wet Prep HPF POC NONE    COMPREHENSIVE METABOLIC PANEL     Status: None   Collection Time    10/09/13  3:44 PM      Result Value Ref Range   Sodium 140  135 - 145 mEq/L   Potassium 3.6  3.5 - 5.3 mEq/L   Chloride 106  96 - 112 mEq/L   CO2 26  19 - 32 mEq/L   Glucose, Bld 95  70 - 99 mg/dL   BUN 8  6 - 23 mg/dL   Creat 0.87  0.50 - 1.10 mg/dL   Total Bilirubin 0.5  0.2 - 1.2 mg/dL   Comment: ** Please note change in reference range(s). **   Alkaline Phosphatase 59  39 - 117 U/L   AST 17  0 - 37 U/L   ALT 9  0 - 35 U/L   Total Protein 7.5  6.0 - 8.3 g/dL   Albumin 4.5  3.5 - 5.2 g/dL   Calcium 9.6  8.4 - 10.5 mg/dL  HIV ANTIBODY (ROUTINE TESTING)     Status: None   Collection Time    10/09/13  3:44 PM      Result Value Ref Range   HIV NON REACTIVE  NON REACTIVE  CBC WITH DIFFERENTIAL     Status: Abnormal   Collection Time    11/25/13  4:38 PM       Result Value Ref Range   WBC 11.0 (*) 4.0 - 10.5 K/uL   RBC 4.31  3.87 - 5.11 MIL/uL   Hemoglobin 12.7  12.0 - 15.0 g/dL   HCT 36.6  36.0 - 46.0 %   MCV 84.9  78.0 - 100.0 fL   MCH 29.5  26.0 - 34.0 pg   MCHC 34.7  30.0 - 36.0 g/dL   RDW 13.9  11.5 - 15.5 %   Platelets 283  150 - 400 K/uL   Neutrophils Relative % 65  43 - 77 %   Neutro Abs 7.1  1.7 - 7.7 K/uL   Lymphocytes Relative 29  12 - 46 %   Lymphs Abs 3.2  0.7 - 4.0 K/uL   Monocytes Relative 5  3 - 12 %   Monocytes Absolute 0.5  0.1 - 1.0 K/uL   Eosinophils Relative 1  0 - 5 %   Eosinophils Absolute 0.1  0.0 - 0.7 K/uL   Basophils Relative 0  0 - 1 %   Basophils Absolute 0.0  0.0 - 0.1 K/uL  BASIC METABOLIC PANEL     Status: Abnormal   Collection Time    11/25/13  4:38 PM      Result Value Ref Range   Sodium 142  137 - 147 mEq/L   Potassium 3.9  3.7 - 5.3 mEq/L   Chloride 107  96 - 112 mEq/L   CO2 22  19 - 32 mEq/L   Glucose, Bld 100 (*) 70 - 99 mg/dL   BUN 10  6 - 23 mg/dL   Creatinine, Ser 0.83  0.50 - 1.10 mg/dL   Calcium 9.3  8.4 - 10.5 mg/dL   GFR calc non Af Amer 88 (*) >90 mL/min   GFR calc Af Amer >90  >90 mL/min   Comment: (NOTE)     The eGFR has been calculated using the CKD EPI equation.     This calculation has not been validated in all clinical situations.     eGFR's persistently <90 mL/min signify possible Chronic Kidney     Disease.  TROPONIN I     Status: None   Collection Time    11/25/13  4:38 PM      Result Value Ref Range   Troponin I <0.30  <0.30 ng/mL   Comment:            Due to the release kinetics of cTnI,     a negative result within the first hours     of the onset of symptoms does not rule out     myocardial infarction with certainty.     If myocardial infarction is still suspected,     repeat the test at appropriate intervals.  URINALYSIS, ROUTINE W REFLEX MICROSCOPIC     Status: None   Collection Time    11/25/13  4:41 PM      Result Value Ref Range   Color, Urine YELLOW   YELLOW   APPearance CLEAR  CLEAR   Specific Gravity, Urine 1.007  1.005 - 1.030   pH 6.5  5.0 - 8.0   Glucose, UA NEGATIVE  NEGATIVE mg/dL   Hgb urine dipstick NEGATIVE  NEGATIVE   Bilirubin Urine NEGATIVE  NEGATIVE   Ketones, ur NEGATIVE  NEGATIVE mg/dL   Protein, ur NEGATIVE  NEGATIVE mg/dL   Urobilinogen, UA 0.2  0.0 - 1.0 mg/dL   Nitrite NEGATIVE  NEGATIVE   Leukocytes, UA NEGATIVE  NEGATIVE   Comment: MICROSCOPIC NOT DONE ON URINES WITH NEGATIVE PROTEIN, BLOOD, LEUKOCYTES, NITRITE, OR GLUCOSE <1000 mg/dL.    Musculoskeletal: Strength & Muscle Tone: within normal limits Gait & Station: normal Patient leans: N/A  Mental status examination Patient is casually dressed and fairly groomed.  She appears anxious, tearful and emotional.  Her speech is slow but clear and coherent.  She describes her mood depressed and her affect is constricted.  She  has no tremors or shakes.  She denies any auditory or visual hallucination.  She denies any active or passive suicidal thoughts or homicidal thoughts.  She admitted irritability and anger but denies any obsessive thoughts.  She denies any paranoia or any delusions.  Her fund of knowledge is adequate.  Her memory is intact.  Her psychomotor activity is slightly slow.  She has no tremors or shakes.  She is alert and oriented x3.  Her insight judgment and impulse control is okay.  Established Problem, Stable/Improving (1), Review of Psycho-Social Stressors (1), Established Problem, Worsening (2), Review of Last Therapy Session (1), Review of Medication Regimen & Side Effects (2) and Review of New Medication or Change in Dosage (2)  Assessment: Axis I: Bipolar disorder, NOS  Axis II: Deferred  Axis III:  Patient Active Problem List   Diagnosis Date Noted  . Atypical chest pain 04/22/2013  . Vaginal discharge 03/13/2013  . Left knee pain 01/09/2013  . GERD (gastroesophageal reflux disease) 12/26/2012  . Stomach burning 12/26/2012  . Right  wrist pain 11/16/2012  . Recurrent boils 09/14/2012  . Tobacco abuse 08/22/2012  . Esophageal dysphagia 10/28/2011  . HELICOBACTER PYLORI GASTRITIS, HX OF 03/20/2009  . HYPERTENSION NEC 03/19/2009  . NEPHROLITHIASIS, HX OF 03/19/2009  . HIATAL HERNIA WITH REFLUX 03/16/2009  . HSV 02/03/2007  . DISORDER, BIPOLAR NOS 02/03/2007  . HX, PERSONAL, GENITAL/OBSTETRIC DISORDER NEC 02/03/2007    Axis IV: Moderate  Axis V: 60-65   Plan:  I reviewed her emergency room visits, blood cells , stressors, psychosocial issues and her current medication.  Patient does not see any improvement with Prozac.  We will try Wellbutrin in a smaller dose.  She had tried Wellbutrin in the past but does not remember the effect that he fell.  He will start 75 mg Wellbutrin for one week and gradually increase to twice a day.  I recommended to decrease Prozac to 10 mg for one week and then stop.  Continue the Ativan, Geodon and trazodone her present dose.  Continue seeing therapist for coping and social skills.  Recommended to call us back if she has any question or any concern.  I will see her again in 3 weeks.  I will consider increasing the Wellbutrin dose on her next appointment.  Time spent 25 minutes.  More than 50% of the time spent in psychoeducation, counseling and coordination of care.  Discuss safety plan that anytime having active suicidal thoughts or homicidal thoughts then patient need to call 911 or go to the local emergency room.  Adamaris King T., MD 12/17/2013

## 2013-12-19 ENCOUNTER — Other Ambulatory Visit (HOSPITAL_COMMUNITY): Payer: Self-pay | Admitting: Psychiatry

## 2013-12-20 ENCOUNTER — Other Ambulatory Visit (HOSPITAL_COMMUNITY): Payer: Self-pay | Admitting: Psychiatry

## 2013-12-26 ENCOUNTER — Ambulatory Visit (INDEPENDENT_AMBULATORY_CARE_PROVIDER_SITE_OTHER): Payer: Medicare Other | Admitting: Family Medicine

## 2013-12-26 ENCOUNTER — Encounter: Payer: Self-pay | Admitting: Family Medicine

## 2013-12-26 ENCOUNTER — Ambulatory Visit (HOSPITAL_COMMUNITY)
Admission: RE | Admit: 2013-12-26 | Discharge: 2013-12-26 | Disposition: A | Discharge status: Home / Self Care | Payer: Medicare Other | Source: Ambulatory Visit | Attending: Family Medicine | Admitting: Family Medicine

## 2013-12-26 ENCOUNTER — Telehealth (HOSPITAL_COMMUNITY): Payer: Self-pay | Admitting: *Deleted

## 2013-12-26 VITALS — BP 109/76 | HR 76 | Temp 98.2°F | Wt 175.0 lb

## 2013-12-26 DIAGNOSIS — R200 Anesthesia of skin: Secondary | ICD-10-CM

## 2013-12-26 DIAGNOSIS — R209 Unspecified disturbances of skin sensation: Secondary | ICD-10-CM

## 2013-12-26 DIAGNOSIS — M4802 Spinal stenosis, cervical region: Secondary | ICD-10-CM | POA: Diagnosis not present

## 2013-12-26 DIAGNOSIS — R202 Paresthesia of skin: Secondary | ICD-10-CM

## 2013-12-26 DIAGNOSIS — M542 Cervicalgia: Secondary | ICD-10-CM

## 2013-12-26 DIAGNOSIS — M5030 Other cervical disc degeneration, unspecified cervical region: Secondary | ICD-10-CM | POA: Diagnosis not present

## 2013-12-26 MED ORDER — GABAPENTIN 100 MG PO CAPS: 100.0000 mg | ORAL_CAPSULE | Freq: Three times a day (TID) | ORAL | Status: DC

## 2013-12-26 NOTE — Patient Instructions (Signed)
Cervical Radiculopathy °Cervical radiculopathy means a nerve in the neck is pinched or bruised. This can cause pain or loss of feeling (numbness) that runs from your neck to your arm and fingers. °HOME CARE  °· Put ice on the injured or painful area. °· Put ice in a plastic bag. °· Place a towel between your skin and the bag. °· Leave the ice on for 15-20 minutes, 03-04 times a day, or as told by your doctor. °· If ice does not help, you can try using heat. Take a warm shower or bath, or use a hot water bottle as told by your doctor. °· You may try a gentle neck and shoulder massage. °· Use a flat pillow when you sleep. °· Only take medicines as told by your doctor. °· Keep all physical therapy visits as told by your doctor. °· If you are given a soft collar, wear it as told by your doctor. °GET HELP RIGHT AWAY IF:  °· Your pain gets worse and is not controlled with medicine. °· You lose feeling or feel weak in your hand, arm, face, or leg. °· You have a fever or stiff neck. °· You cannot control when you poop or pee (incontinence). °· You have trouble with walking, balance, or speaking. °MAKE SURE YOU:  °· Understand these instructions. °· Will watch your condition. °· Will get help right away if you are not doing well or get worse. °Document Released: 08/11/2011 Document Revised: 11/14/2011 Document Reviewed: 08/11/2011 °[employer]® Patient Information ©2014 [employer]. ° °

## 2013-12-26 NOTE — Progress Notes (Signed)
Subjective:     Patient ID: Sandra Miller, female   DOB: 10/06/1975, 38 y.o.   MRN: [id number]  HPI Numbness: C/O left arm pain all the time,she had similar symptoms a long time before but it was on and off then and now constant, feels like tingling and numbness on the tip of her left fingers radiating from the left side of her neck to shoulder and then her finger tips. Constant for the last two weeks, although symptom started more than 1 yr ago. She was told in the past she has DJD of her shoulder. Feels some weakness at times but can still hold object in her left arm. Denies hx of neck or arm trauma.   Current Outpatient Prescriptions on File Prior to Visit  Medication Sig Dispense Refill  . LORazepam (ATIVAN) 1 MG tablet Take 1 tablet (1 mg total) by mouth at bedtime.  30 tablet  0  . Multiple Vitamin (MULTIVITAMIN) tablet Take 1 tablet by mouth daily.      Marland Kitchen omeprazole (PRILOSEC) 40 MG capsule Take 1 capsule (40 mg total) by mouth 2 (two) times daily.  60 capsule  6  . topiramate (TOPAMAX) 25 MG tablet Take 50 mg by mouth at bedtime.      . traZODone (DESYREL) 100 MG tablet Take 1 tablet (100 mg total) by mouth at bedtime.  30 tablet  1  . valACYclovir (VALTREX) 500 MG tablet Take 1 tablet (500 mg total) by mouth daily.  30 tablet  2  . ziprasidone (GEODON) 80 MG capsule Take 1 capsule at bed time  30 capsule  1  . acetaminophen (TYLENOL) 325 MG tablet Take 2 tablets (650 mg total) by mouth every 6 (six) hours as needed.  30 tablet  0  . buPROPion (WELLBUTRIN) 75 MG tablet Take 1 tablet (75 mg total) by mouth 2 (two) times daily.  60 tablet  0   No current facility-administered medications on file prior to visit.   Past Medical History  Diagnosis Date  . Anxiety   . Hypertension     history of  . GERD (gastroesophageal reflux disease)   . Hiatal hernia   . HSV   . DISORDER, BIPOLAR NOS   . ALCOHOLISM     history of  . DISORDER, TOBACCO USE   . SUBSTANCE ABUSE     history of  .  CONSTIPATION   . Abdominal pain, unspecified site   . EPIGASTRIC PAIN   . HELICOBACTER PYLORI GASTRITIS, HX OF   . NEPHROLITHIASIS, HX OF   . HX, PERSONAL, GENITAL/OBSTETRIC DISORDER NEC   . Esophageal dysphagia       Review of Systems  Respiratory: Negative.   Cardiovascular: Negative.   Gastrointestinal: Negative.   Neurological: Positive for numbness. Negative for weakness.       Tingling sensation.  All other systems reviewed and are negative.  Filed Vitals:   12/26/13 1427  BP: 109/76  Pulse: 76  Temp: 98.2 F (36.8 C)  TempSrc: Oral  Weight: 175 lb (79.379 kg)       Objective:   Physical Exam  Nursing note and vitals reviewed. Constitutional: She is oriented to person, place, and time. She appears well-developed. No distress.  Cardiovascular: Normal rate, regular rhythm, normal heart sounds and intact distal pulses.   No murmur heard. Pulmonary/Chest: Effort normal and breath sounds normal. No respiratory distress. She has no wheezes.  Abdominal: Soft. Bowel sounds are normal. There is no tenderness.  Musculoskeletal: Normal  range of motion. She exhibits no edema.  Neurological: She is alert and oriented to person, place, and time. She has normal strength and normal reflexes. No cranial nerve deficit or sensory deficit.       Assessment:     Left arm paresthesia: Cervical radiculopathy.     Plan:     Check problem list.

## 2013-12-26 NOTE — Assessment & Plan Note (Signed)
Likely cervical radiculopathy. As discussed with patient we can start baseline assessment with cervical spine xray to check for stenosis or any deformity. I referred to PT as well. Trial of gabapentin for few days, I discussed s/e of medication to her,she stated she had used it before and will like to try it. I advised her to d/c medication if having any reaction or intolerance. If Neck xray is not diagnostic to consider MRI cervical spine and or EMG. She will f/u with her PCP on this.

## 2013-12-26 NOTE — Telephone Encounter (Signed)
Patient left WH:[id number] MD put her on new medicine.Wants to make sure it mixes with what Dr.Arfeen gives her. Left message for pt: Advised pt to talk to pharmacist regarding medication mixtures.

## 2013-12-27 ENCOUNTER — Telehealth: Payer: Self-pay | Admitting: Family Medicine

## 2013-12-27 NOTE — Telephone Encounter (Signed)
Message left for her to call back for her xray report:  IMPRESSION: No evidence of acute osseous abnormality. Unchanged appearance of mild degenerative disc disease in the mid and lower cervical spine.

## 2013-12-31 ENCOUNTER — Telehealth (HOSPITAL_COMMUNITY): Payer: Self-pay | Admitting: Marriage and Family Therapist

## 2013-12-31 ENCOUNTER — Ambulatory Visit (HOSPITAL_COMMUNITY): Payer: Self-pay | Admitting: Marriage and Family Therapist

## 2013-12-31 NOTE — Progress Notes (Unsigned)
   THERAPIST PROGRESS NOTE  Session Time:  4:00 - 5:00 p.m.  Participation Level: DID NOT ATTEND  Behavioral Response:   Type of Therapy: Individual Therapy  Treatment Goals addressed: Coping  Interventions:   Summary: Sandra Miller is a 38 y.o. female African American who presents with bipolar disorder with depression.  She was referred by Dr. [name] Schilder.  PATIENT DID NOT SHOW FOR HER APPOINTMENT.  Suicidal/Homicidal:   Therapist Response:  CALLED AND LEFT MESSAGE.  Plan: Return again in 3 weeks.  Diagnosis: Axis I: Bipolar disorder, NOS    Axis II: Deferred    Sheryn Aldaz, LMFT, CTS 12/31/2013

## 2013-12-31 NOTE — Telephone Encounter (Signed)
Patient did not show for her appointment today.  Called and left message.  Lorene Dy, LMFT, CTS Counselor

## 2014-01-07 ENCOUNTER — Encounter (HOSPITAL_COMMUNITY): Payer: Self-pay | Admitting: Psychiatry

## 2014-01-07 ENCOUNTER — Ambulatory Visit (INDEPENDENT_AMBULATORY_CARE_PROVIDER_SITE_OTHER): Payer: Medicare Other | Admitting: Psychiatry

## 2014-01-07 VITALS — BP 140/78 | HR 80 | Ht 65.5 in | Wt 177.6 lb

## 2014-01-07 DIAGNOSIS — F319 Bipolar disorder, unspecified: Secondary | ICD-10-CM | POA: Diagnosis not present

## 2014-01-07 MED ORDER — TRAZODONE HCL 100 MG PO TABS: 100.0000 mg | ORAL_TABLET | Freq: Every day | ORAL | Status: DC

## 2014-01-07 MED ORDER — TOPIRAMATE 25 MG PO TABS: 50.0000 mg | ORAL_TABLET | Freq: Every day | ORAL | Status: DC

## 2014-01-07 MED ORDER — ZIPRASIDONE HCL 80 MG PO CAPS: ORAL_CAPSULE | ORAL | Status: DC

## 2014-01-07 MED ORDER — FLUOXETINE HCL 10 MG PO CAPS: ORAL_CAPSULE | ORAL | Status: DC

## 2014-01-07 MED ORDER — LORAZEPAM 1 MG PO TABS
1.0000 mg | ORAL_TABLET | Freq: Every day | ORAL | Status: DC
Start: 2014-01-07 — End: 2014-03-06

## 2014-01-07 NOTE — Progress Notes (Signed)
[facility] Progress Note  Sandra Miller [id number] 38 y.o.  01/07/2014 3:23 PM  Chief Complaint  Patient presents with  . Follow-up    History of Present Illness: Sandra Miller came for the appointment.  She has not started Wellbutrin because she was scared .  Recently she was seen by her primary care physician for pain in her left arm.  She was given gabapentin 100 mg 3 times a day.  She has not started gabapentin because she wanted to discuss with this writer first.  The patient also scheduled to have an MRI this Friday .  She is nervous and anxious about gabapentin.  She is complaining of pain and numbness.  She is taking Prozac 30 mg.  She does not want to try a new medication at this time.  She continued to endorse depression and lack of motivation however denies any suicidal thoughts or homicidal thoughts.   She spent time with the children on the weekend and felt much better.  She is seeing therapist and she is very happy and like to continue counseling with her.  She denies any paranoia or any hallucination.  She is compliant the Geodon, Ativan , trazodone and Topamax.  She is sleeping better.  She denies any agitation or any anger.  Her vitals are stable.   Suicidal Ideation: No Plan Formed: No Patient has means to carry out plan: No  Homicidal Ideation: No Plan Formed: No Patient has means to carry out plan: No  ROS  Psychiatric: Agitation: No Hallucination: No Depressed Mood: Yes Insomnia: No Hypersomnia: No Altered Concentration: No Feels Worthless: No Grandiose Ideas: No Belief In Special Powers: No New/Increased Substance Abuse: No Compulsions: No  Neurologic: Headache: Yes Seizure: No Paresthesias: No  Medical history. Patient has history of hypertension , migraine headache and GERD.    Psychosocial history . Patient lives with her 21 year old son.  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     Outpatient Encounter  Prescriptions as of 01/07/2014  Medication Sig  . LORazepam (ATIVAN) 1 MG tablet Take 1 tablet (1 mg total) by mouth at bedtime.  . topiramate (TOPAMAX) 25 MG tablet Take 2 tablets (50 mg total) by mouth at bedtime.  . traZODone (DESYREL) 100 MG tablet Take 1 tablet (100 mg total) by mouth at bedtime.  . ziprasidone (GEODON) 80 MG capsule Take 1 capsule at bed time  . [DISCONTINUED] LORazepam (ATIVAN) 1 MG tablet Take 1 tablet (1 mg total) by mouth at bedtime.  . [DISCONTINUED] topiramate (TOPAMAX) 25 MG tablet Take 50 mg by mouth at bedtime.  . [DISCONTINUED] traZODone (DESYREL) 100 MG tablet Take 1 tablet (100 mg total) by mouth at bedtime.  . [DISCONTINUED] ziprasidone (GEODON) 80 MG capsule Take 1 capsule at bed time  . acetaminophen (TYLENOL) 325 MG tablet Take 2 tablets (650 mg total) by mouth every 6 (six) hours as needed.  Marland Kitchen FLUoxetine (PROZAC) 10 MG capsule Take 3 capsule a day  . gabapentin (NEURONTIN) 100 MG capsule Take 1 capsule (100 mg total) by mouth 3 (three) times daily.  . Multiple Vitamin (MULTIVITAMIN) tablet Take 1 tablet by mouth daily.  Marland Kitchen omeprazole (PRILOSEC) 40 MG capsule Take 1 capsule (40 mg total) by mouth 2 (two) times daily.  . valACYclovir (VALTREX) 500 MG tablet Take 1 tablet (500 mg total) by mouth daily.  . [DISCONTINUED] buPROPion (WELLBUTRIN) 75 MG tablet Take 1 tablet (75 mg total) by mouth 2 (two)  times daily.    Past Psychiatric History/Hospitalization(s): Patient has been seen in this office since 2008 on and off.  She has history of bipolar disorder, alcohol dependence and marijuana abuse.  She claims to be sober for past 16 months from drugs and alcohol.  She has history of agitation anger on aggressive behavior.  Patient has multiple hospitalization.  Her first psychiatric hospitalization was at age 65.  She is at [facility] psychiatric hospitalization.  She's been admitted in behavioral [facility] in October 2008.  In the past she had tried lithium,  Seroquel, Lamictal, Paxil, Celexa, Depakote, Neurontin and Cymbalta with limited response.  Patient is very reluctant to take any medication that cause weight gain.  Patient denies any history of suicidal attempt but admitted history of aggression and violence. Anxiety: Yes Bipolar Disorder: Yes Depression: Yes Mania: Yes Psychosis: Yes Schizophrenia: No Personality Disorder: No Hospitalization for psychiatric illness: Yes History of Electroconvulsive Shock Therapy: No Prior Suicide Attempts: No  Physical Exam: Constitutional:  BP 140/78  Pulse 80  Ht 5' 5.5" (1.664 m)  Wt 177 lb 9.6 oz (80.559 kg)  BMI 29.09 kg/m2  General Appearance: well nourished,  Recent Results (from the past 2160 hour(s))  POCT WET PREP (WET MOUNT)     Status: Abnormal   Collection Time    10/09/13  3:30 PM      Result Value Ref Range   Source Wet Prep POC VAG     WBC, Wet Prep HPF POC OCCASIONAL     Bacteria Wet Prep HPF POC 3+ RODS     Clue Cells Wet Prep HPF POC None     CLUE CELLS WET PREP WHIFF POC Negative Whiff     Yeast Wet Prep HPF POC Moderate     Trichomonas Wet Prep HPF POC NONE    COMPREHENSIVE METABOLIC PANEL     Status: None   Collection Time    10/09/13  3:44 PM      Result Value Ref Range   Sodium 140  135 - 145 mEq/L   Potassium 3.6  3.5 - 5.3 mEq/L   Chloride 106  96 - 112 mEq/L   CO2 26  19 - 32 mEq/L   Glucose, Bld 95  70 - 99 mg/dL   BUN 8  6 - 23 mg/dL   Creat 0.87  0.50 - 1.10 mg/dL   Total Bilirubin 0.5  0.2 - 1.2 mg/dL   Comment: ** Please note change in reference range(s). **   Alkaline Phosphatase 59  39 - 117 U/L   AST 17  0 - 37 U/L   ALT 9  0 - 35 U/L   Total Protein 7.5  6.0 - 8.3 g/dL   Albumin 4.5  3.5 - 5.2 g/dL   Calcium 9.6  8.4 - 10.5 mg/dL  HIV ANTIBODY (ROUTINE TESTING)     Status: None   Collection Time    10/09/13  3:44 PM      Result Value Ref Range   HIV NON REACTIVE  NON REACTIVE  CBC WITH DIFFERENTIAL     Status: Abnormal   Collection Time     11/25/13  4:38 PM      Result Value Ref Range   WBC 11.0 (*) 4.0 - 10.5 K/uL   RBC 4.31  3.87 - 5.11 MIL/uL   Hemoglobin 12.7  12.0 - 15.0 g/dL   HCT 36.6  36.0 - 46.0 %   MCV 84.9  78.0 - 100.0 fL  MCH 29.5  26.0 - 34.0 pg   MCHC 34.7  30.0 - 36.0 g/dL   RDW 13.9  11.5 - 15.5 %   Platelets 283  150 - 400 K/uL   Neutrophils Relative % 65  43 - 77 %   Neutro Abs 7.1  1.7 - 7.7 K/uL   Lymphocytes Relative 29  12 - 46 %   Lymphs Abs 3.2  0.7 - 4.0 K/uL   Monocytes Relative 5  3 - 12 %   Monocytes Absolute 0.5  0.1 - 1.0 K/uL   Eosinophils Relative 1  0 - 5 %   Eosinophils Absolute 0.1  0.0 - 0.7 K/uL   Basophils Relative 0  0 - 1 %   Basophils Absolute 0.0  0.0 - 0.1 K/uL  BASIC METABOLIC PANEL     Status: Abnormal   Collection Time    11/25/13  4:38 PM      Result Value Ref Range   Sodium 142  137 - 147 mEq/L   Potassium 3.9  3.7 - 5.3 mEq/L   Chloride 107  96 - 112 mEq/L   CO2 22  19 - 32 mEq/L   Glucose, Bld 100 (*) 70 - 99 mg/dL   BUN 10  6 - 23 mg/dL   Creatinine, Ser 0.83  0.50 - 1.10 mg/dL   Calcium 9.3  8.4 - 10.5 mg/dL   GFR calc non Af Amer 88 (*) >90 mL/min   GFR calc Af Amer >90  >90 mL/min   Comment: (NOTE)     The eGFR has been calculated using the CKD EPI equation.     This calculation has not been validated in all clinical situations.     eGFR's persistently <90 mL/min signify possible Chronic Kidney     Disease.  TROPONIN I     Status: None   Collection Time    11/25/13  4:38 PM      Result Value Ref Range   Troponin I <0.30  <0.30 ng/mL   Comment:            Due to the release kinetics of cTnI,     a negative result within the first hours     of the onset of symptoms does not rule out     myocardial infarction with certainty.     If myocardial infarction is still suspected,     repeat the test at appropriate intervals.  URINALYSIS, ROUTINE W REFLEX MICROSCOPIC     Status: None   Collection Time    11/25/13  4:41 PM      Result Value Ref Range    Color, Urine YELLOW  YELLOW   APPearance CLEAR  CLEAR   Specific Gravity, Urine 1.007  1.005 - 1.030   pH 6.5  5.0 - 8.0   Glucose, UA NEGATIVE  NEGATIVE mg/dL   Hgb urine dipstick NEGATIVE  NEGATIVE   Bilirubin Urine NEGATIVE  NEGATIVE   Ketones, ur NEGATIVE  NEGATIVE mg/dL   Protein, ur NEGATIVE  NEGATIVE mg/dL   Urobilinogen, UA 0.2  0.0 - 1.0 mg/dL   Nitrite NEGATIVE  NEGATIVE   Leukocytes, UA NEGATIVE  NEGATIVE   Comment: MICROSCOPIC NOT DONE ON URINES WITH NEGATIVE PROTEIN, BLOOD, LEUKOCYTES, NITRITE, OR GLUCOSE <1000 mg/dL.    Musculoskeletal: Strength & Muscle Tone: within normal limits Gait & Station: normal Patient leans: N/A  Mental status examination Patient is casually dressed and fairly groomed.  She is anxious but cooperative.  Her  speech is slow but clear and coherent.  She describes her mood anxious and her affect is mood appropriate.  She has no tremors or shakes.  She denies any auditory or visual hallucination.  She denies any active or passive suicidal thoughts or homicidal thoughts.  She denies any paranoia or any delusions.  Her fund of knowledge is adequate.  Her memory is intact.  Her psychomotor activity is slightly slow.  She has no tremors or shakes.  She is alert and oriented x3.  Her insight judgment and impulse control is okay.  Established Problem, Stable/Improving (1), Review of Last Therapy Session (1) and Review of Medication Regimen & Side Effects (2)  Assessment: Axis I: Bipolar disorder, NOS  Axis II: Deferred  Axis III:  Patient Active Problem List   Diagnosis Date Noted  . Paresthesia of left upper limb 12/26/2013  . Atypical chest pain 04/22/2013  . Vaginal discharge 03/13/2013  . Left knee pain 01/09/2013  . GERD (gastroesophageal reflux disease) 12/26/2012  . Stomach burning 12/26/2012  . Right wrist pain 11/16/2012  . Recurrent boils 09/14/2012  . Tobacco abuse 08/22/2012  . Esophageal dysphagia 10/28/2011  . HELICOBACTER  PYLORI GASTRITIS, HX OF 03/20/2009  . HYPERTENSION NEC 03/19/2009  . NEPHROLITHIASIS, HX OF 03/19/2009  . HIATAL HERNIA WITH REFLUX 03/16/2009  . HSV 02/03/2007  . DISORDER, BIPOLAR NOS 02/03/2007  . HX, PERSONAL, GENITAL/OBSTETRIC DISORDER NEC 02/03/2007    Axis IV: Moderate  Axis V: 60-65   Plan:  I recommend he stay on Prozac 30 mg until she try Neurontin 100 mg 3 times a day which is prescribed by her physician for left arm pain.  I also discussed that gabapentin can help her anxiety.  We will defer any changes in her antidepressant until she had a trial of gabapentin.  Patient also scheduled to have an MRI this Friday.  Encouraged to keep appointment with therapist for coping and social skills. Continue  Ativan, Geodon and trazodone and Topamax her present dose.  Recommended to call us back if she has any question or any concern.  I will see her again in 6 weeks.   Rikia Sukhu T., MD 01/07/2014

## 2014-01-09 ENCOUNTER — Ambulatory Visit: Payer: Self-pay | Admitting: Family Medicine

## 2014-01-10 ENCOUNTER — Ambulatory Visit (INDEPENDENT_AMBULATORY_CARE_PROVIDER_SITE_OTHER): Payer: Medicare Other | Admitting: Family Medicine

## 2014-01-10 ENCOUNTER — Encounter: Payer: Self-pay | Admitting: Family Medicine

## 2014-01-10 VITALS — BP 132/79 | HR 76 | Temp 99.1°F | Ht 65.5 in | Wt 176.6 lb

## 2014-01-10 DIAGNOSIS — G589 Mononeuropathy, unspecified: Secondary | ICD-10-CM | POA: Diagnosis not present

## 2014-01-10 DIAGNOSIS — R209 Unspecified disturbances of skin sensation: Secondary | ICD-10-CM

## 2014-01-10 DIAGNOSIS — G629 Polyneuropathy, unspecified: Secondary | ICD-10-CM

## 2014-01-10 DIAGNOSIS — R202 Paresthesia of skin: Secondary | ICD-10-CM

## 2014-01-10 DIAGNOSIS — R200 Anesthesia of skin: Secondary | ICD-10-CM

## 2014-01-10 MED ORDER — GABAPENTIN 300 MG PO CAPS: 300.0000 mg | ORAL_CAPSULE | Freq: Three times a day (TID) | ORAL | Status: DC

## 2014-01-10 NOTE — Patient Instructions (Signed)
Dear Ms. Vanessen,   Thank you for coming to clinic today. Please read below regarding the issues that we discussed.   Left arm numbness - We need to get an MRI of her neck as the next step in her evaluation. Additionally if he should increase your gabapentin to 300 mg 3 times a day. I will call you with the results of the MRI.  Please follow up in clinic in 1 month. Please call earlier if you have any questions or concerns.   Sincerely,   Dr. Maricela Bo

## 2014-01-10 NOTE — Progress Notes (Signed)
   Subjective:    Patient ID: Sandra Miller, female    DOB: 05-Jun-1976, 38 y.o.   MRN: [id number]  HPI  38 year old female with a history of bipolar disorder who presents with numbness of her left arm. This started approximately 2 years ago and was intermittent. She was worked up at urgent care at the time and told that she has degenerative disc disease. In the last 2 months, it has become persistent numbness in in the left arm and hand.She says the loss of sensation covers her entire arm and hand. She denies weakness but notes that she uses the arm less due to lack of feeling. She has recently had an evaluated at [facility]. She was prescribed Neurontin for her symptoms. She is taking 100 mg 3 times a day. This does not help. It is alleviated by nothing and worsened by positioning.   Imaging: cervical spine x-ray 12/26/13 Mild disc space narrowing and anterior endplate osteophyte formation at C4-5 and C5-6 does not appear significantly changed. There is minimal osseous  neural foraminal narrowing on the right C6-7 and on the left at C5-6 and C6-7.   PMH - bipolar disorder, anxiety   [place] - diabetes  Review of Systems Negative for numbness or weakness of other body parts, negative for vision changes negative for urinary incontinence    Objective:   Physical Exam BP 132/79  Pulse 76  Temp(Src) 99.1 F (37.3 C) (Oral)  Ht 5' 5.5" (1.664 m)  Wt 176 lb 9.6 oz (80.105 kg)  BMI 28.93 kg/m2  Gen: middle-aged [place] American female, overweight, non-ill appearing MSK: normal appearance of left upper [place] and joints including shoulder, elbow, and wrist; 5/5 strength of upper sugars bilaterally Neuro: 2+ deep tendon reflexes of the biceps, brachia radialis, and triceps bilaterally; decreased sensation of the entire left upper extremity and hand; positive Spurling's to the left Skin: no rash     Assessment & Plan:

## 2014-01-10 NOTE — Assessment & Plan Note (Signed)
A: persistent numbness of the left upper showed me it is not in the distribution of a specific spinal nerve; there is no weakness consistent with a lesion of the brachial plexus; there are no systemic symptoms concerning for multiple sclerosis or other demyelinating diseases, but there could be a peripheral neuropathy P:  - obtain MRI of cervical spine without contrast given the degenerated state of her vertebrae in the x-ray - increase Neurontin to 300 mg 3 times a day - Follow up 1 month

## 2014-01-14 ENCOUNTER — Ambulatory Visit: Payer: Medicare Other | Admitting: Physical Therapy

## 2014-01-21 ENCOUNTER — Ambulatory Visit (HOSPITAL_COMMUNITY): Payer: Self-pay | Admitting: Marriage and Family Therapist

## 2014-01-21 ENCOUNTER — Ambulatory Visit: Payer: Medicare Other | Admitting: Physical Therapy

## 2014-01-28 ENCOUNTER — Ambulatory Visit (HOSPITAL_COMMUNITY): Payer: Medicare Other | Admitting: Marriage and Family Therapist

## 2014-01-31 ENCOUNTER — Ambulatory Visit (HOSPITAL_COMMUNITY): Admission: RE | Admit: 2014-01-31 | Payer: Medicare Other | Source: Ambulatory Visit

## 2014-02-04 ENCOUNTER — Ambulatory Visit (HOSPITAL_COMMUNITY): Payer: Self-pay | Admitting: Marriage and Family Therapist

## 2014-02-05 ENCOUNTER — Telehealth (HOSPITAL_COMMUNITY): Payer: Self-pay | Admitting: *Deleted

## 2014-02-05 NOTE — Telephone Encounter (Signed)
Initiated Prior Authorization with [employer] for Fluoxetine 30 mg. Insurance requires Prior Auth due to increased quantity of medication #90 capsules/30 days - dose increased to 30 mg 01/07/14 from 20 mg to improve control of symptoms Increase in quantity required as Fluoxetine not available in 30 mg capsules. Clinical information given to representative - Kim[place].  She submitted for review - should receive answer in 48- 72 hours

## 2014-02-06 ENCOUNTER — Encounter (HOSPITAL_COMMUNITY): Payer: Self-pay | Admitting: *Deleted

## 2014-02-06 NOTE — Progress Notes (Signed)
[employer] authorized Fluoxetine #90/30 days effective thru 02/07/15 Pharmacy faxed

## 2014-02-07 ENCOUNTER — Ambulatory Visit (HOSPITAL_COMMUNITY): Admission: RE | Admit: 2014-02-07 | Payer: Medicare Other | Source: Ambulatory Visit

## 2014-02-18 ENCOUNTER — Encounter (HOSPITAL_COMMUNITY): Payer: Self-pay | Admitting: Psychiatry

## 2014-02-18 ENCOUNTER — Ambulatory Visit (INDEPENDENT_AMBULATORY_CARE_PROVIDER_SITE_OTHER): Payer: Medicare Other | Admitting: Psychiatry

## 2014-02-18 VITALS — BP 177/114 | HR 103 | Wt 174.0 lb

## 2014-02-18 DIAGNOSIS — F319 Bipolar disorder, unspecified: Secondary | ICD-10-CM

## 2014-02-18 MED ORDER — OLANZAPINE 10 MG PO TABS: 10.0000 mg | ORAL_TABLET | Freq: Every day | ORAL | Status: DC

## 2014-02-18 NOTE — Progress Notes (Signed)
[facility] [phone] Progress Note  Sandra Miller [id number] 38 y.o.  02/18/2014 5:29 PM  Chief Complaint  Patient presents with  . Depression  . Anxiety  . Follow-up  . Stress  . Medication Refill    History of Present Illness: Sandra Miller came for the appointment.  She's been complaining of increased anxiety stress and not feeling well.  She admitted irritability, anger and paranoia.  Hypertension is very high today.  She admitted recently crying spells and tearfulness.  She feels stress from school and work.  She has a final tomorrow and she is thinking to take a semester off in the future.  She was hoping to graduate next March.  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  She endorsed job is not stressful but she feels very anxious and irritable.  She is compliant with her medication.  We have increase Prozac to 30 mg on the last visit but she has not seen any improvement.  She admitted urged to go back and drinking and smoking marijuana however she has been able to control herself.  She endorsed sometime hallucination telling him to go back to drugs but she does not want to do it.  She supposed to have MRI which was rescheduled.  She continues to have headaches.  She is taking Ativan, Geodon, trazodone and Topamax.  Her blood pressure was very high today.  She has no chest pain, sweating or vomiting.  She goes to primary care physician at [facility] family practice.   Suicidal Ideation: No Plan Formed: No Patient has means to carry out plan: No  Homicidal Ideation: No Plan Formed: No Patient has means to carry out plan: No  ROS  Psychiatric: Agitation: Yes Hallucination: Yes Depressed Mood: Yes Insomnia: Yes Hypersomnia: No Altered Concentration: No Feels Worthless: Yes Grandiose Ideas: No Belief In Special Powers: No New/Increased Substance Abuse: No Compulsions: No  Neurologic: Headache: Yes Seizure: No Paresthesias: No  Medical  history. Patient has history of hypertension , migraine headache and GERD.    Psychosocial history . Patient lives with her 28 year old son.  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     Outpatient Encounter Prescriptions as of 02/18/2014  Medication Sig  . acetaminophen (TYLENOL) 325 MG tablet Take 2 tablets (650 mg total) by mouth every 6 (six) hours as needed.  Marland Kitchen FLUoxetine (PROZAC) 10 MG capsule Take 3 capsule a day  . gabapentin (NEURONTIN) 300 MG capsule Take 1 capsule (300 mg total) by mouth 3 (three) times daily.  Marland Kitchen LORazepam (ATIVAN) 1 MG tablet Take 1 tablet (1 mg total) by mouth at bedtime.  . Multiple Vitamin (MULTIVITAMIN) tablet Take 1 tablet by mouth daily.  Marland Kitchen OLANZapine (ZYPREXA) 10 MG tablet Take 1 tablet (10 mg total) by mouth at bedtime.  Marland Kitchen omeprazole (PRILOSEC) 40 MG capsule Take 1 capsule (40 mg total) by mouth 2 (two) times daily.  Marland Kitchen topiramate (TOPAMAX) 25 MG tablet Take 2 tablets (50 mg total) by mouth at bedtime.  . traZODone (DESYREL) 100 MG tablet Take 1 tablet (100 mg total) by mouth at bedtime.  . valACYclovir (VALTREX) 500 MG tablet Take 1 tablet (500 mg total) by mouth daily.  . [DISCONTINUED] ziprasidone (GEODON) 80 MG capsule Take 1 capsule at bed time    Past Psychiatric History/Hospitalization(s): Patient has been seen in this office since 2008 on and off.  She has history of  bipolar disorder, alcohol dependence and marijuana abuse.  She claims to be sober for past 16 months from drugs and alcohol.  She has history of agitation anger on aggressive behavior.  Patient has multiple hospitalization.  Her first psychiatric hospitalization was at age 49.  She is at lease 6 psychiatric hospitalization.  She's been admitted in behavioral [place] in October 2008.  In the past she had tried lithium, Seroquel, Lamictal, Paxil, Celexa, Depakote, Neurontin and Cymbalta with limited response.  Patient is very reluctant to take any medication that  cause weight gain.  Patient denies any history of suicidal attempt but admitted history of aggression and violence. Anxiety: Yes Bipolar Disorder: Yes Depression: Yes Mania: Yes Psychosis: Yes Schizophrenia: No Personality Disorder: No Hospitalization for psychiatric illness: Yes History of Electroconvulsive Shock Therapy: No Prior Suicide Attempts: No  Physical Exam: Constitutional:  BP 177/114  Pulse 103  Wt 174 lb (78.926 kg)  General Appearance: well nourished,  Recent Results (from the past 2160 hour(s))  CBC WITH DIFFERENTIAL     Status: Abnormal   Collection Time    11/25/13  4:38 PM      Result Value Ref Range   WBC 11.0 (*) 4.0 - 10.5 K/uL   RBC 4.31  3.87 - 5.11 MIL/uL   Hemoglobin 12.7  12.0 - 15.0 g/dL   HCT 36.6  36.0 - 46.0 %   MCV 84.9  78.0 - 100.0 fL   MCH 29.5  26.0 - 34.0 pg   MCHC 34.7  30.0 - 36.0 g/dL   RDW 13.9  11.5 - 15.5 %   Platelets 283  150 - 400 K/uL   Neutrophils Relative % 65  43 - 77 %   Neutro Abs 7.1  1.7 - 7.7 K/uL   Lymphocytes Relative 29  12 - 46 %   Lymphs Abs 3.2  0.7 - 4.0 K/uL   Monocytes Relative 5  3 - 12 %   Monocytes Absolute 0.5  0.1 - 1.0 K/uL   Eosinophils Relative 1  0 - 5 %   Eosinophils Absolute 0.1  0.0 - 0.7 K/uL   Basophils Relative 0  0 - 1 %   Basophils Absolute 0.0  0.0 - 0.1 K/uL  BASIC METABOLIC PANEL     Status: Abnormal   Collection Time    11/25/13  4:38 PM      Result Value Ref Range   Sodium 142  137 - 147 mEq/L   Potassium 3.9  3.7 - 5.3 mEq/L   Chloride 107  96 - 112 mEq/L   CO2 22  19 - 32 mEq/L   Glucose, Bld 100 (*) 70 - 99 mg/dL   BUN 10  6 - 23 mg/dL   Creatinine, Ser 0.83  0.50 - 1.10 mg/dL   Calcium 9.3  8.4 - 10.5 mg/dL   GFR calc non Af Amer 88 (*) >90 mL/min   GFR calc Af Amer >90  >90 mL/min   Comment: (NOTE)     The eGFR has been calculated using the CKD EPI equation.     This calculation has not been validated in all clinical situations.     eGFR's persistently <90 mL/min  signify possible Chronic Kidney     Disease.  TROPONIN I     Status: None   Collection Time    11/25/13  4:38 PM      Result Value Ref Range   Troponin I <0.30  <0.30 ng/mL   Comment:  Due to the release kinetics of cTnI,     a negative result within the first hours     of the onset of symptoms does not rule out     myocardial infarction with certainty.     If myocardial infarction is still suspected,     repeat the test at appropriate intervals.  URINALYSIS, ROUTINE W REFLEX MICROSCOPIC     Status: None   Collection Time    11/25/13  4:41 PM      Result Value Ref Range   Color, Urine YELLOW  YELLOW   APPearance CLEAR  CLEAR   Specific Gravity, Urine 1.007  1.005 - 1.030   pH 6.5  5.0 - 8.0   Glucose, UA NEGATIVE  NEGATIVE mg/dL   Hgb urine dipstick NEGATIVE  NEGATIVE   Bilirubin Urine NEGATIVE  NEGATIVE   Ketones, ur NEGATIVE  NEGATIVE mg/dL   Protein, ur NEGATIVE  NEGATIVE mg/dL   Urobilinogen, UA 0.2  0.0 - 1.0 mg/dL   Nitrite NEGATIVE  NEGATIVE   Leukocytes, UA NEGATIVE  NEGATIVE   Comment: MICROSCOPIC NOT DONE ON URINES WITH NEGATIVE PROTEIN, BLOOD, LEUKOCYTES, NITRITE, OR GLUCOSE <1000 mg/dL.    Musculoskeletal: Strength & Muscle Tone: within normal limits Gait & Station: normal Patient leans: N/A  Mental status examination Patient is casually dressed and fairly groomed.  She is anxious, tearful but cooperative.  Her speech is slow but clear and coherent.  She describes her mood depressed and sad.  Her affect is constricted.  She has no tremors or shakes.  She endorse auditory hallucination and paranoia but denies any active or passive suicidal thoughts or homicidal thoughts.  Her fund of knowledge is adequate.  Her memory is intact.  Her psychomotor activity is slightly slow.  She has no tremors or shakes.  She is alert and oriented x3.  Her insight judgment and impulse control is okay.  Established Problem, Stable/Improving (1), Review of Psycho-Social  Stressors (1), Review and summation of old records (2), Established Problem, Worsening (2), Review of Last Therapy Session (1), Review of Medication Regimen & Side Effects (2) and Review of New Medication or Change in Dosage (2)  Assessment: Axis I: Bipolar disorder, NOS  Axis II: Deferred  Axis III:  Patient Active Problem List   Diagnosis Date Noted  . Paresthesia of left upper limb 12/26/2013  . Atypical chest pain 04/22/2013  . Vaginal discharge 03/13/2013  . Left knee pain 01/09/2013  . GERD (gastroesophageal reflux disease) 12/26/2012  . Stomach burning 12/26/2012  . Right wrist pain 11/16/2012  . Recurrent boils 09/14/2012  . Tobacco abuse 08/22/2012  . Esophageal dysphagia 10/28/2011  . HELICOBACTER PYLORI GASTRITIS, HX OF 03/20/2009  . HYPERTENSION NEC 03/19/2009  . NEPHROLITHIASIS, HX OF 03/19/2009  . HIATAL HERNIA WITH REFLUX 03/16/2009  . HSV 02/03/2007  . DISORDER, BIPOLAR NOS 02/03/2007  . HX, PERSONAL, GENITAL/OBSTETRIC DISORDER NEC 02/03/2007    Axis IV: Moderate  Axis V: 60-65   Plan:  Patient is decompensating slowly.  She had tried Zyprexa in the past with good response.  I recommended to restart Zyprexa 10 mg.  She is concerned about the weight gain however at this time she agreed to take Zyprexa and if she started to gain weight then she will do a bit loss program.  I am concerned about her high blood pressure.  I strongly recommended to see her primary care physician for the management of hypertension.  I will discontinue Geodon is not working.  Recommended to continue Prozac 30 mg and Ativan as prescribed.  I will see her again in 2 weeks.  In the past she has taken propranolol but it was discontinued by her primary care physician because her perpetrator was normal.  Discussed in detail the risks and benefits of medication.  Time spent 25 minutes.  More than 50% of the time spent in psychoeducation, counseling and coordination of care.  Discuss safety plan  that anytime having active suicidal thoughts or homicidal thoughts then patient need to call 911 or go to the local emergency room.  Duchess Armendarez T., MD 02/18/2014

## 2014-03-06 ENCOUNTER — Encounter (HOSPITAL_COMMUNITY): Payer: Self-pay | Admitting: Psychiatry

## 2014-03-06 ENCOUNTER — Ambulatory Visit (INDEPENDENT_AMBULATORY_CARE_PROVIDER_SITE_OTHER): Payer: Medicare Other | Admitting: Psychiatry

## 2014-03-06 VITALS — BP 133/75 | HR 70 | Ht 65.5 in | Wt 177.0 lb

## 2014-03-06 DIAGNOSIS — F319 Bipolar disorder, unspecified: Secondary | ICD-10-CM

## 2014-03-06 MED ORDER — LORAZEPAM 1 MG PO TABS: 1.0000 mg | ORAL_TABLET | Freq: Every day | ORAL | Status: DC

## 2014-03-06 MED ORDER — OLANZAPINE 10 MG PO TABS: 10.0000 mg | ORAL_TABLET | Freq: Every day | ORAL | Status: DC

## 2014-03-06 MED ORDER — FLUOXETINE HCL 10 MG PO CAPS: ORAL_CAPSULE | ORAL | Status: DC

## 2014-03-06 MED ORDER — TRAZODONE HCL 100 MG PO TABS: 100.0000 mg | ORAL_TABLET | Freq: Every day | ORAL | Status: DC

## 2014-03-06 NOTE — Progress Notes (Signed)
[facility] [id number] Progress Note  Sandra Miller [id number] 38 y.o.  03/06/2014 2:40 PM  Chief Complaint  Patient presents with  . Follow-up  . Medication Refill    History of Present Illness: Sandra Miller came for the appointment.  She is doing better on Zyprexa 10 mg daily she is sleeping better.  She denies any agitation, anger, mood swing.  Her crying spells are less intense and less frequent.  Collie Siad noticed improvement with the Zyprexa.  She has gained 2 pounds from the last visit and she is concerned about it however we have discussion in the past that she will closely monitor her weight and try to lose weight and exercise and watching her calorie intake.  She is not as scheduled to see a therapist and she wants to give more time to Zyprexa.  However she had promised that if she started to get worse and she will seek counseling.  Patient is very happy because she is making good grades at school.  She was hoping to graduate next March.  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  She was to continue her current medication.  She is taking Ativan 1 mg as needed, Zyprexa 10 mg at bedtime and trazodone 100 mg at bedtime.  Her vitals are stable.  Her headaches are less intense and less frequent.  She takes Topamax.  Suicidal Ideation: No Plan Formed: No Patient has means to carry out plan: No  Homicidal Ideation: No Plan Formed: No Patient has means to carry out plan: No  ROS  Psychiatric: Agitation: No Hallucination: No Depressed Mood: No Insomnia: No Hypersomnia: No Altered Concentration: No Feels Worthless: No Grandiose Ideas: No Belief In Special Powers: No New/Increased Substance Abuse: No Compulsions: No  Neurologic: Headache: Yes Seizure: No Paresthesias: No  Medical history. Patient has history of hypertension , migraine headache and GERD.    Psychosocial history . Patient lives with her 39 year old son.  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   Her daughter recently moved out .     Outpatient Encounter Prescriptions as of 03/06/2014  Medication Sig  . LORazepam (ATIVAN) 1 MG tablet Take 1 tablet (1 mg total) by mouth at bedtime.  Marland Kitchen OLANZapine (ZYPREXA) 10 MG tablet Take 1 tablet (10 mg total) by mouth at bedtime.  . topiramate (TOPAMAX) 25 MG tablet Take 2 tablets (50 mg total) by mouth at bedtime.  . traZODone (DESYREL) 100 MG tablet Take 1 tablet (100 mg total) by mouth at bedtime.  . [DISCONTINUED] gabapentin (NEURONTIN) 300 MG capsule Take 1 capsule (300 mg total) by mouth 3 (three) times daily.  . [DISCONTINUED] LORazepam (ATIVAN) 1 MG tablet Take 1 tablet (1 mg total) by mouth at bedtime.  . [DISCONTINUED] OLANZapine (ZYPREXA) 10 MG tablet Take 1 tablet (10 mg total) by mouth at bedtime.  . [DISCONTINUED] traZODone (DESYREL) 100 MG tablet Take 1 tablet (100 mg total) by mouth at bedtime.  Marland Kitchen acetaminophen (TYLENOL) 325 MG tablet Take 2 tablets (650 mg total) by mouth every 6 (six) hours as needed.  Marland Kitchen FLUoxetine (PROZAC) 10 MG capsule Take 3 capsule a day  . Multiple Vitamin (MULTIVITAMIN) tablet Take 1 tablet by mouth daily.  Marland Kitchen omeprazole (PRILOSEC) 40 MG capsule Take 1 capsule (40 mg total) by mouth 2 (two) times daily.  . valACYclovir (VALTREX) 500 MG tablet Take 1 tablet (500 mg total) by mouth daily.  . [DISCONTINUED] FLUoxetine (PROZAC) 10 MG  capsule Take 3 capsule a day    Past Psychiatric History/Hospitalization(s): Patient has been seen in this office since 2008 on and off.  She has history of bipolar disorder, alcohol dependence and marijuana abuse.  She claims to be sober for past 16 months from drugs and alcohol.  She has history of agitation anger on aggressive behavior.  Patient has multiple hospitalization.  Her first psychiatric hospitalization was at age 32.  She is at lease 6 psychiatric hospitalization.  She's been admitted in behavioral [place] in October 2008.  In the past she had tried lithium, Seroquel,  Lamictal, Paxil, Celexa, Depakote, Neurontin and Cymbalta with limited response.  Patient is very reluctant to take any medication that cause weight gain.  Patient denies any history of suicidal attempt but admitted history of aggression and violence. Anxiety: Yes Bipolar Disorder: Yes Depression: Yes Mania: Yes Psychosis: Yes Schizophrenia: No Personality Disorder: No Hospitalization for psychiatric illness: Yes History of Electroconvulsive Shock Therapy: No Prior Suicide Attempts: No  Physical Exam: Constitutional:  BP 133/75  Pulse 70  Ht 5' 5.5" (1.664 m)  Wt 177 lb (80.287 kg)  BMI 29.00 kg/m2  General Appearance: well nourished,  No results found for this or any previous visit (from the past 2160 hour(s)).  Musculoskeletal: Strength & Muscle Tone: within normal limits Gait & Station: normal Patient leans: N/A  Mental status examination Patient is casually dressed and fairly groomed.  She is calm and cooperative.  Her speech is slow but clear and coherent.  She describes her mood better and her affect is improved from the past.  She has no tremors or shakes.  She denies any auditory or visual hallucination.  She has no paranoia or any obsessive thoughts.  She denies any active or passive suicidal thoughts or homicidal thoughts.  Her fund of knowledge is adequate.  Her memory is intact.  Her psychomotor activity is slightly slow.  She has no tremors or shakes.  She is alert and oriented x3.  Her insight judgment and impulse control is okay.  Established Problem, Stable/Improving (1), Review of Psycho-Social Stressors (1), Review of Last Therapy Session (1) and Review of Medication Regimen & Side Effects (2)  Assessment: Axis I: Bipolar disorder, NOS  Axis II: Deferred  Axis III:  Patient Active Problem List   Diagnosis Date Noted  . Paresthesia of left upper limb 12/26/2013  . Atypical chest pain 04/22/2013  . Vaginal discharge 03/13/2013  . Left knee pain 01/09/2013   . GERD (gastroesophageal reflux disease) 12/26/2012  . Stomach burning 12/26/2012  . Right wrist pain 11/16/2012  . Recurrent boils 09/14/2012  . Tobacco abuse 08/22/2012  . Esophageal dysphagia 10/28/2011  . HELICOBACTER PYLORI GASTRITIS, HX OF 03/20/2009  . HYPERTENSION NEC 03/19/2009  . NEPHROLITHIASIS, HX OF 03/19/2009  . HIATAL HERNIA WITH REFLUX 03/16/2009  . HSV 02/03/2007  . DISORDER, BIPOLAR NOS 02/03/2007  . HX, PERSONAL, GENITAL/OBSTETRIC DISORDER NEC 02/03/2007    Axis IV: Moderate  Axis V: 60-65   Plan:  Patient is doing better on Zyprexa 10 mg.  I will continue trazodone and Ativan at present dose.  Recommended to call for appointment if she decided to see a counselor.  Discussed the risks and benefits of medication especially weight gain with Zyprexa.  The strongly encouraged to watch her calorie intake and regular exercise.  I will see her again in 2 months.   Boleslaw Borghi T., MD 03/06/2014

## 2014-03-13 ENCOUNTER — Other Ambulatory Visit (HOSPITAL_COMMUNITY): Payer: Self-pay | Admitting: Psychiatry

## 2014-03-14 ENCOUNTER — Other Ambulatory Visit (HOSPITAL_COMMUNITY): Payer: Self-pay | Admitting: Psychiatry

## 2014-03-14 DIAGNOSIS — F319 Bipolar disorder, unspecified: Secondary | ICD-10-CM

## 2014-03-14 MED ORDER — TOPIRAMATE 25 MG PO TABS: 50.0000 mg | ORAL_TABLET | Freq: Every day | ORAL | Status: DC

## 2014-04-15 ENCOUNTER — Other Ambulatory Visit: Payer: Self-pay | Admitting: Gastroenterology

## 2014-04-23 ENCOUNTER — Telehealth: Payer: Self-pay | Admitting: Gastroenterology

## 2014-04-23 MED ORDER — OMEPRAZOLE 40 MG PO CPDR: 40.0000 mg | DELAYED_RELEASE_CAPSULE | Freq: Two times a day (BID) | ORAL | Status: DC

## 2014-04-23 NOTE — Telephone Encounter (Signed)
RX SENT

## 2014-04-29 ENCOUNTER — Ambulatory Visit (INDEPENDENT_AMBULATORY_CARE_PROVIDER_SITE_OTHER): Payer: Medicare Other | Admitting: *Deleted

## 2014-04-29 ENCOUNTER — Ambulatory Visit (INDEPENDENT_AMBULATORY_CARE_PROVIDER_SITE_OTHER): Payer: Medicare Other | Admitting: Family Medicine

## 2014-04-29 ENCOUNTER — Other Ambulatory Visit (HOSPITAL_COMMUNITY)
Admission: RE | Admit: 2014-04-29 | Discharge: 2014-04-29 | Disposition: A | Discharge status: Home / Self Care | Payer: Medicare Other | Source: Ambulatory Visit | Attending: Family Medicine | Admitting: Family Medicine

## 2014-04-29 VITALS — BP 119/77 | HR 69 | Temp 98.2°F | Ht 65.5 in | Wt 193.0 lb

## 2014-04-29 DIAGNOSIS — Z113 Encounter for screening for infections with a predominantly sexual mode of transmission: Secondary | ICD-10-CM | POA: Diagnosis not present

## 2014-04-29 DIAGNOSIS — N9089 Other specified noninflammatory disorders of vulva and perineum: Secondary | ICD-10-CM

## 2014-04-29 DIAGNOSIS — Z202 Contact with and (suspected) exposure to infections with a predominantly sexual mode of transmission: Secondary | ICD-10-CM

## 2014-04-29 DIAGNOSIS — Z20828 Contact with and (suspected) exposure to other viral communicable diseases: Secondary | ICD-10-CM | POA: Diagnosis not present

## 2014-04-29 DIAGNOSIS — Z111 Encounter for screening for respiratory tuberculosis: Secondary | ICD-10-CM

## 2014-04-29 DIAGNOSIS — N760 Acute vaginitis: Secondary | ICD-10-CM

## 2014-04-29 DIAGNOSIS — L720 Epidermal cyst: Secondary | ICD-10-CM | POA: Insufficient documentation

## 2014-04-29 LAB — POCT WET PREP (WET MOUNT): Clue Cells Wet Prep Whiff POC: NEGATIVE

## 2014-04-29 NOTE — Progress Notes (Signed)
Patient ID: Sandra Miller, female   DOB: 04-12-1976, 38 y.o.   MRN: [id number]  HPI:  Pt presents for a same day appointment to discuss possible STD exposure.  Had intercourse with her ex about three weeks ago and the condom broke. She has no new or different vaginal discharge (states she always has some vaginal discharge). No pelvic pain. Just wants to be tested for STD's today.   ROS: See HPI  [place]: hx bipolar disorder, HTN, GERD, hidradenitis, HSV  PHYSICAL EXAM: BP 119/77  Pulse 69  Temp(Src) 98.2 F (36.8 C) (Oral)  Ht 5' 5.5" (1.664 m)  Wt 193 lb (87.544 kg)  BMI 31.62 kg/m2 Gen: NAD HEENT: NCAT Heart: RRR Lungs: CTAB, NWOB Abdomen: soft, nontender to palpation Neuro: grossly nonfocal, speech normal GU: hardened raised nickel-sized hypopigmented area on left side of perineum just adjacent to the vagina. Vagina is moist with thin white discharge. Cervix normal in appearance. No cervical motion tenderness or tenderness on bimanual exam. No adnexal masses.   ASSESSMENT/PLAN:  # STD check: - will check gc/chlamydia, wet prep, HIV, RPR today - recommended repeat HIV/RPR testing in 3 mos since antibody not positive in initial illness - handout given on safe sex  FOLLOW UP: F/u in a few weeks for biopsy of vulvar lesion.  [place]. Ardelia Mems, [place]

## 2014-04-29 NOTE — Progress Notes (Signed)
   Pt in nurse clinic for PPD placement.  PPD placed left forearm.  Pt advised to return in 48-72 for reading.  Appt 05/01/2014 at 2 PM.  Derl Barrow, RN

## 2014-04-29 NOTE — Patient Instructions (Addendum)
Return in a few weeks to have the spot on your bottom looked at, and possibly a biopsy taken. We will call you if your test results show anything that need to be treated.  Be well, Dr. Randell Loop Sex Safe sex is about reducing the risk of giving or getting a sexually transmitted disease (STD). STDs are spread through sexual contact involving the genitals, mouth, or rectum. Some STDs can be cured and others cannot. Safe sex can also prevent unintended pregnancies.  WHAT ARE SOME SAFE SEX PRACTICES?  Limit your sexual activity to only one partner who is having sex with only you.  Talk to your partner about his or her past partners, past STDs, and drug use.  Use a condom every time you have sexual intercourse. This includes vaginal, oral, and anal sexual activity. Both females and males should wear condoms during oral sex. Only use latex or polyurethane condoms and water-based lubricants. Using petroleum-based lubricants or oils to lubricate a condom will weaken the condom and increase the chance that it will break. The condom should be in place from the beginning to the end of sexual activity. Wearing a condom reduces, but does not completely eliminate, your risk of getting or giving an STD. STDs can be spread by contact with infected body fluids and skin.  Get vaccinated for hepatitis B and HPV.  Avoid alcohol and recreational drugs, which can affect your judgment. You may forget to use a condom or participate in high-risk sex.  For females, avoid douching after sexual intercourse. Douching can spread an infection farther into the reproductive tract.  Check your body for signs of sores, blisters, rashes, or unusual discharge. See your health care provider if you notice any of these signs.  Avoid sexual contact if you have symptoms of an infection or are being treated for an STD. If you or your partner has herpes, avoid sexual contact when blisters are present. Use condoms at all other  times.  If you are at risk of being infected with HIV, it is recommended that you take a prescription medicine daily to prevent HIV infection. This is called pre-exposure prophylaxis (PrEP). You are considered at risk if:  You are a man who has sex with other men (MSM).  You are a heterosexual man or woman who is sexually active with more than one partner.  You take drugs by injection.  You are sexually active with a partner who has HIV.  Talk with your health care provider about whether you are at high risk of being infected with HIV. If you choose to begin PrEP, you should first be tested for HIV. You should then be tested every 3 months for as long as you are taking PrEP.  See your health care provider for regular screenings, exams, and tests for other STDs. Before having sex with a new partner, each of you should be screened for STDs and should talk about the results with each other. WHAT ARE THE BENEFITS OF SAFE SEX?   There is less chance of getting or giving an STD.  You can prevent unwanted or unintended pregnancies.  By discussing safe sex concerns with your partner, you may increase feelings of intimacy, comfort, trust, and honesty between the two of you. Document Released: 09/29/2004 Document Revised: 01/06/2014 Document Reviewed: 02/13/2012 [facility] Patient Information 2015 [place], [place]. This information is not intended to replace advice given to you by your health care provider. Make sure you discuss any questions you have  with your health care provider.  

## 2014-04-29 NOTE — Assessment & Plan Note (Signed)
Noted on exam today. Per patient, has been present for some time. Advised she return for a separate visit in a few weeks to have the area evaluated more thoroughly, and likely for a biopsy of the area. Want to rule out neoplastic process. Pt agreeable to scheduling separate visit.

## 2014-04-30 ENCOUNTER — Telehealth: Payer: Self-pay | Admitting: *Deleted

## 2014-04-30 LAB — RPR

## 2014-04-30 LAB — HIV ANTIBODY (ROUTINE TESTING W REFLEX): HIV 1&2 Ab, 4th Generation: NONREACTIVE

## 2014-04-30 NOTE — Telephone Encounter (Signed)
LMOVM for pt to call back. Read message below. [place], [place] CMA

## 2014-04-30 NOTE — Telephone Encounter (Signed)
Message copied by Junious Dresser on Wed Apr 30, 2014 10:52 AM ------      Message from: Leeanne Rio      Created: Wed Apr 30, 2014  8:47 AM       [facility] red team, please call pt and let her know all STD tests were negative. Thanks! Leeanne Rio, MD ------

## 2014-04-30 NOTE — Telephone Encounter (Signed)
Message given to pt

## 2014-05-01 ENCOUNTER — Ambulatory Visit (INDEPENDENT_AMBULATORY_CARE_PROVIDER_SITE_OTHER): Payer: Medicare Other | Admitting: *Deleted

## 2014-05-01 ENCOUNTER — Encounter: Payer: Self-pay | Admitting: *Deleted

## 2014-05-01 DIAGNOSIS — Z111 Encounter for screening for respiratory tuberculosis: Secondary | ICD-10-CM

## 2014-05-01 LAB — TB SKIN TEST
Induration: 0 mm
TB SKIN TEST: NEGATIVE

## 2014-05-01 NOTE — Progress Notes (Signed)
   Pt in nurse clinic for PPD Reading.  0 MM, Negative result.  Derl Barrow, RN

## 2014-05-05 ENCOUNTER — Telehealth: Payer: Self-pay | Admitting: Internal Medicine

## 2014-05-05 ENCOUNTER — Ambulatory Visit: Payer: Self-pay | Admitting: Internal Medicine

## 2014-05-08 ENCOUNTER — Encounter (HOSPITAL_COMMUNITY): Payer: Self-pay | Admitting: Psychiatry

## 2014-05-08 ENCOUNTER — Ambulatory Visit (INDEPENDENT_AMBULATORY_CARE_PROVIDER_SITE_OTHER): Payer: Medicare Other | Admitting: Psychiatry

## 2014-05-08 VITALS — BP 118/69 | HR 76 | Ht 65.5 in | Wt 189.4 lb

## 2014-05-08 DIAGNOSIS — F319 Bipolar disorder, unspecified: Secondary | ICD-10-CM

## 2014-05-08 MED ORDER — OLANZAPINE 10 MG PO TABS: 10.0000 mg | ORAL_TABLET | Freq: Every day | ORAL | Status: DC

## 2014-05-08 MED ORDER — TOPIRAMATE 25 MG PO TABS: 50.0000 mg | ORAL_TABLET | Freq: Every day | ORAL | Status: DC

## 2014-05-08 MED ORDER — LORAZEPAM 1 MG PO TABS: 1.0000 mg | ORAL_TABLET | Freq: Every day | ORAL | Status: DC

## 2014-05-08 MED ORDER — TRAZODONE HCL 100 MG PO TABS: 100.0000 mg | ORAL_TABLET | Freq: Every day | ORAL | Status: DC

## 2014-05-08 NOTE — Progress Notes (Signed)
[facility] Progress Note  Sandra Miller [id number] 38 y.o.  05/08/2014 3:50 PM  Chief Complaint  Patient presents with  . Follow-up  . Medication Refill    History of Present Illness: Sandra Miller came for the appointment.  She is a stable on her current medication however she is concerned about weight gain.  She admitted not watching her calorie intake when she is drinking soda and sweet .  Overall her mood has been stable.  She denies any recent agitation anger or any mood swings.  She denies any tremors or shakes.  Her job is going very well.  She is trying to keep herself busy in the school and at work.  On the weekends she keep her grandchild who is now 86 months old.  She has noticed improvement in the school.  She is hoping to graduate next March.  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    Suicidal Ideation: No Plan Formed: No Patient has means to carry out plan: No  Homicidal Ideation: No Plan Formed: No Patient has means to carry out plan: No  ROS  Psychiatric: Agitation: No Hallucination: No Depressed Mood: No Insomnia: No Hypersomnia: No Altered Concentration: No Feels Worthless: No Grandiose Ideas: No Belief In Special Powers: No New/Increased Substance Abuse: No Compulsions: No  Neurologic: Headache: Yes Seizure: No Paresthesias: No  Medical history. Patient has history of hypertension , migraine headache and GERD.    Psychosocial history . Patient lives with her 15 year old son.  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     Outpatient Encounter Prescriptions as of 05/08/2014  Medication Sig  . acetaminophen (TYLENOL) 325 MG tablet Take 2 tablets (650 mg total) by mouth every 6 (six) hours as needed.  Marland Kitchen FLUoxetine (PROZAC) 10 MG capsule Take 3 capsule a day  . LORazepam (ATIVAN) 1 MG tablet Take 1 tablet (1 mg total) by mouth at bedtime.  . Multiple Vitamin (MULTIVITAMIN) tablet Take 1  tablet by mouth daily.  Marland Kitchen OLANZapine (ZYPREXA) 10 MG tablet Take 1 tablet (10 mg total) by mouth at bedtime.  Marland Kitchen omeprazole (PRILOSEC) 40 MG capsule Take 1 capsule (40 mg total) by mouth 2 (two) times daily.  Marland Kitchen topiramate (TOPAMAX) 25 MG tablet Take 2 tablets (50 mg total) by mouth at bedtime.  . traZODone (DESYREL) 100 MG tablet Take 1 tablet (100 mg total) by mouth at bedtime.  . valACYclovir (VALTREX) 500 MG tablet Take 1 tablet (500 mg total) by mouth daily.  . [DISCONTINUED] LORazepam (ATIVAN) 1 MG tablet Take 1 tablet (1 mg total) by mouth at bedtime.  . [DISCONTINUED] OLANZapine (ZYPREXA) 10 MG tablet Take 1 tablet (10 mg total) by mouth at bedtime.  . [DISCONTINUED] topiramate (TOPAMAX) 25 MG tablet Take 2 tablets (50 mg total) by mouth at bedtime.  . [DISCONTINUED] traZODone (DESYREL) 100 MG tablet Take 1 tablet (100 mg total) by mouth at bedtime.    Past Psychiatric History/Hospitalization(s): Patient has been seen in this office since 2008 on and off.  She has history of bipolar disorder, alcohol dependence and marijuana abuse.  She is at lease 6 psychiatric hospitalization.  She's been admitted in behavioral Sandra Miller in October 2008.  In the past she had tried lithium, Seroquel, Lamictal, Paxil, Celexa, Depakote, Neurontin and Cymbalta with limited response.   Anxiety: Yes Bipolar Disorder: Yes Depression: Yes Mania: Yes Psychosis: Yes Schizophrenia: No  Personality Disorder: No Hospitalization for psychiatric illness: Yes History of Electroconvulsive Shock Therapy: No Prior Suicide Attempts: No  Physical Exam: Constitutional:  BP 118/69  Pulse 76  Ht 5' 5.5" (1.664 m)  Wt 189 lb 6.4 oz (85.911 kg)  BMI 31.03 kg/m2  General Appearance: well nourished,  Recent Results (from the past 2160 hour(s))  POCT WET PREP (WET MOUNT)     Status: None   Collection Time    04/29/14 12:58 PM      Result Value Ref Range   Source Wet Prep POC VAG     WBC, Wet Prep HPF POC 5-10      Bacteria Wet Prep HPF POC 2+ RODS     Clue Cells Wet Prep HPF POC None     CLUE CELLS WET PREP WHIFF POC Negative Whiff     Yeast Wet Prep HPF POC None     Trichomonas Wet Prep HPF POC None    HIV ANTIBODY (ROUTINE TESTING)     Status: None   Collection Time    04/29/14  1:04 PM      Result Value Ref Range   HIV 1&2 Ab, 4th Generation NONREACTIVE  NONREACTIVE   Comment:       A NONREACTIVE HIV Ag/Ab result does not exclude HIV infection since     the time frame for seroconversion is variable. If acute HIV infection     is suspected, a HIV-1 RNA Qualitative TMA test is recommended.           HIV-1/2 Antibody Diff         Not indicated.     HIV-1 RNA, Qual TMA           Not indicated.           PLEASE NOTE: This information has been disclosed to you from records     whose confidentiality may be protected by state law. If your state     requires such protection, then the state law prohibits you from making     any further disclosure of the information without the specific written     consent of the person to whom it pertains, or as otherwise permitted     by law. A general authorization for the release of medical or other     information is NOT sufficient for this purpose.           The performance of this assay has not been clinically validated in     patients less than 61 years old.  RPR     Status: None   Collection Time    04/29/14  1:04 PM      Result Value Ref Range   RPR NON REAC  NON REAC  TB SKIN TEST     Status: Normal   Collection Time    05/01/14  2:10 PM      Result Value Ref Range   TB Skin Test Negative     Induration 0      Musculoskeletal: Strength & Muscle Tone: within normal limits Gait & Station: normal Patient leans: N/A  Mental status examination Patient is casually dressed and fairly groomed.  She is calm and cooperative.  Her speech is slow but clear and coherent.  She describes her mood is euthymic and affect is appropriate.  She has no tremors  or shakes.  She denies any auditory or visual hallucination.  She has no paranoia or any obsessive thoughts.  She denies any  active or passive suicidal thoughts or homicidal thoughts.  Her fund of knowledge is adequate.  Her memory is intact.  Her psychomotor activity is slightly slow.  She has no tremors or shakes.  She is alert and oriented x3.  Her insight judgment and impulse control is okay.  Established Problem, Stable/Improving (1), Review of Psycho-Social Stressors (1), Review of Last Therapy Session (1) and Review of Medication Regimen & Side Effects (2)  Assessment: Axis I: Bipolar disorder, NOS  Axis II: Deferred  Axis III:  Patient Active Problem List   Diagnosis Date Noted  . Vulvar lesion 04/29/2014  . Paresthesia of left upper limb 12/26/2013  . Atypical chest pain 04/22/2013  . Vaginal discharge 03/13/2013  . Left knee pain 01/09/2013  . GERD (gastroesophageal reflux disease) 12/26/2012  . Stomach burning 12/26/2012  . Right wrist pain 11/16/2012  . Recurrent boils 09/14/2012  . Tobacco abuse 08/22/2012  . Esophageal dysphagia 10/28/2011  . HELICOBACTER PYLORI GASTRITIS, HX OF 03/20/2009  . HYPERTENSION NEC 03/19/2009  . NEPHROLITHIASIS, HX OF 03/19/2009  . HIATAL HERNIA WITH REFLUX 03/16/2009  . HSV 02/03/2007  . DISORDER, BIPOLAR NOS 02/03/2007  . HX, PERSONAL, GENITAL/OBSTETRIC DISORDER NEC 02/03/2007    Axis IV: Moderate  Axis V: 60-65   Plan:  Patient is stable on her current medication.  However she has gained weight from the past.  Strongly encouraged to watch her calorie intake, died and due to to exercise.  At this time she does not have any other concerns or side effects.  I will continue Zyprexa 10 mg at bedtime, Ativan 1 mg at bedtime, Topamax 25 mg 2 tablets at bedtime and trazodone 100 mg mg at bedtime. Discussed the risks and benefits of medication.  Recommended to call us back if she has any question or any concern. I will see her again in 2  months.   Autym Siess T., MD 05/08/2014

## 2014-05-17 ENCOUNTER — Other Ambulatory Visit (HOSPITAL_COMMUNITY): Payer: Self-pay | Admitting: Psychiatry

## 2014-05-20 ENCOUNTER — Other Ambulatory Visit: Payer: Self-pay | Admitting: Internal Medicine

## 2014-05-20 ENCOUNTER — Ambulatory Visit (INDEPENDENT_AMBULATORY_CARE_PROVIDER_SITE_OTHER): Payer: Medicare Other | Admitting: Family Medicine

## 2014-05-20 ENCOUNTER — Encounter: Payer: Self-pay | Admitting: Family Medicine

## 2014-05-20 VITALS — BP 100/70 | HR 78 | Ht 65.5 in | Wt 191.0 lb

## 2014-05-20 DIAGNOSIS — A630 Anogenital (venereal) warts: Secondary | ICD-10-CM | POA: Diagnosis not present

## 2014-05-20 DIAGNOSIS — N9089 Other specified noninflammatory disorders of vulva and perineum: Secondary | ICD-10-CM | POA: Diagnosis not present

## 2014-05-20 DIAGNOSIS — Z23 Encounter for immunization: Secondary | ICD-10-CM

## 2014-05-20 MED ORDER — IMIQUIMOD 5 % EX CREA: TOPICAL_CREAM | CUTANEOUS | Status: DC

## 2014-05-20 NOTE — Assessment & Plan Note (Signed)
RPR negative, lesion is very superficial which is reassuring that this is unlikely malignant.  Punch biopsy taken today and sent for pathology.  Will f/u on results.

## 2014-05-20 NOTE — Assessment & Plan Note (Signed)
Rx for imiquimod cream sent to pharmacy.  Advised patient to apply 3 times weekly as needed when warts appear.

## 2014-05-20 NOTE — Progress Notes (Signed)
Subjective:   Sandra Miller is a 38 y.o. female with a history of HPV genital warts and HSV here for vuvlar lesion f/u and biopsy.  L Vulvar lesion was found during vaginal exam for STD screening on 04/29/14.  Noted to be round, firm, raised, hypopigmented and approx nickel-sized.  Patient reports that it was very tender, but less so now.  She also has h/o HPV genital warts that have been frozen multiple times.  She wonders what else can be done for them today.   Review of Systems:  Per HPI. All other systems reviewed and are negative.    Past Medical History: Patient Active Problem List   Diagnosis Date Noted  . Genital warts 05/20/2014  . Vulvar lesion 04/29/2014  . Paresthesia of left upper limb 12/26/2013  . Atypical chest pain 04/22/2013  . Vaginal discharge 03/13/2013  . Left knee pain 01/09/2013  . GERD (gastroesophageal reflux disease) 12/26/2012  . Stomach burning 12/26/2012  . Right wrist pain 11/16/2012  . Recurrent boils 09/14/2012  . Tobacco abuse 08/22/2012  . Esophageal dysphagia 10/28/2011  . HELICOBACTER PYLORI GASTRITIS, HX OF 03/20/2009  . HYPERTENSION NEC 03/19/2009  . NEPHROLITHIASIS, HX OF 03/19/2009  . HIATAL HERNIA WITH REFLUX 03/16/2009  . HSV 02/03/2007  . DISORDER, BIPOLAR NOS 02/03/2007  . HX, PERSONAL, GENITAL/OBSTETRIC DISORDER NEC 02/03/2007    Medications: reviewed and updated Current Outpatient Prescriptions  Medication Sig Dispense Refill  . acetaminophen (TYLENOL) 325 MG tablet Take 2 tablets (650 mg total) by mouth every 6 (six) hours as needed.  30 tablet  0  . FLUoxetine (PROZAC) 10 MG capsule TAKE 3 CAPSULES DAILY.  90 capsule  1  . imiquimod (ALDARA) 5 % cream Apply topically 3 (three) times a week.  12 each  0  . LORazepam (ATIVAN) 1 MG tablet Take 1 tablet (1 mg total) by mouth at bedtime.  30 tablet  1  . Multiple Vitamin (MULTIVITAMIN) tablet Take 1 tablet by mouth daily.      Marland Kitchen OLANZapine (ZYPREXA) 10 MG tablet Take 1  tablet (10 mg total) by mouth at bedtime.  30 tablet  1  . omeprazole (PRILOSEC) 40 MG capsule Take 1 capsule (40 mg total) by mouth 2 (two) times daily.  60 capsule  0  . topiramate (TOPAMAX) 25 MG tablet Take 2 tablets (50 mg total) by mouth at bedtime.  60 tablet  1  . traZODone (DESYREL) 100 MG tablet Take 1 tablet (100 mg total) by mouth at bedtime.  30 tablet  1  . valACYclovir (VALTREX) 500 MG tablet Take 1 tablet (500 mg total) by mouth daily.  30 tablet  2   No current facility-administered medications for this visit.    Objective:  BP 100/70  Pulse 78  Ht 5' 5.5" (1.664 m)  Wt 191 lb (86.637 kg)  BMI 31.29 kg/m2  Gen:  38 y.o. female sitting in NAD Abd: Soft, NTND, BS present, no guarding or organomegaly Gyn: hardened raised nickel-sized hypopigmented area on left side of perineum just adjacent to the vagina. Multiple sites of genital warts. No HSV lesions noted. Neuro: Alert and oriented, speech normal    RPR, HIV, GC/Chlamydia, Wet prep negative (04/29/14)  Punch biopsy of perineal lesion performed in clinic today.  Area was cleaned and prepped, biopsy taken and bleeding controlled with pressure and silver nitrate.   Assessment:     Sandra Miller is a 38 y.o. female here for biopsy of vulvar/perineal lesion and  HPV genital warts f/u.    Plan:     See problem list for problem-specific plans.   Lavon Paganini, MD PGY-1,  [facility] 05/20/2014  12:31 PM

## 2014-05-20 NOTE — Patient Instructions (Signed)
It was nice to meet you today.  We did a biopsy today and someone will call you with the results.  You can sit in a bath of warm water 2-3 times daily for the next 3-5 days to help with healing.  You can apply some antibiotic ointment to the area.  Be sure to keep the area clean after going to the bathroom.  I sent a prescription for Imiquimod to apply to your warts.  Come back and see [place] if you have any concerns or the wound is not healing.  Best, Dr. Brita Romp (Dr. B)  Wound Care Wound care helps prevent pain and infection.  You may need a tetanus shot if:  You cannot remember when you had your last tetanus shot.  You have never had a tetanus shot.  The injury broke your skin. If you need a tetanus shot and you choose not to have one, you may get tetanus. Sickness from tetanus can be serious. HOME CARE   Only take medicine as told by your doctor.  Clean the wound daily with mild soap and water.  Change any bandages (dressings) as told by your doctor.  Put medicated cream and a bandage on the wound as told by your doctor.  Change the bandage if it gets wet, dirty, or starts to smell.  Take showers. Do not take baths, swim, or do anything that puts your wound under water.  Rest and raise (elevate) the wound until the pain and puffiness (swelling) are better.  Keep all doctor visits as told. GET HELP RIGHT AWAY IF:   Yellowish-white fluid (pus) comes from the wound.  Medicine does not lessen your pain.  There is a red streak going away from the wound.  You have a fever. MAKE SURE YOU:   Understand these instructions.  Will watch your condition.  Will get help right away if you are not doing well or get worse. Document Released: 05/31/2008 Document Revised: 11/14/2011 Document Reviewed: 12/26/2010 [facility] Patient Information 2015 [place], [place]. This information is not intended to replace advice given to you by your health care provider. Make sure you discuss  any questions you have with your health care provider.

## 2014-05-22 ENCOUNTER — Telehealth: Payer: Self-pay | Admitting: Family Medicine

## 2014-05-22 ENCOUNTER — Encounter: Payer: Self-pay | Admitting: Family Medicine

## 2014-05-22 NOTE — Telephone Encounter (Signed)
Called patient to inform her that vulvar/perineal lesion path shows that it was a benign epidermal inclusion cyst.  Reassured patient that this is not cancerous and nothing further needs to be done.  Patient expressed relief and understanding.   Lavon Paganini, MD, MPH PGY-1,  [facility] 05/22/2014  11:05 AM

## 2014-06-04 NOTE — Telephone Encounter (Signed)
See previous encounter

## 2014-06-24 ENCOUNTER — Other Ambulatory Visit: Payer: Self-pay | Admitting: Internal Medicine

## 2014-07-08 ENCOUNTER — Ambulatory Visit (HOSPITAL_COMMUNITY): Payer: Self-pay | Admitting: Psychiatry

## 2014-07-08 ENCOUNTER — Ambulatory Visit (INDEPENDENT_AMBULATORY_CARE_PROVIDER_SITE_OTHER): Payer: Medicare Other | Admitting: Internal Medicine

## 2014-07-08 ENCOUNTER — Encounter: Payer: Self-pay | Admitting: Internal Medicine

## 2014-07-08 VITALS — BP 126/78 | HR 76 | Ht 65.5 in | Wt 190.5 lb

## 2014-07-08 DIAGNOSIS — K219 Gastro-esophageal reflux disease without esophagitis: Secondary | ICD-10-CM

## 2014-07-08 DIAGNOSIS — R1013 Epigastric pain: Secondary | ICD-10-CM | POA: Diagnosis not present

## 2014-07-08 MED ORDER — OMEPRAZOLE 40 MG PO CPDR: DELAYED_RELEASE_CAPSULE | ORAL | Status: DC

## 2014-07-08 NOTE — Patient Instructions (Addendum)
We have sent the following medications to your pharmacy for you to pick up at your convenience: Prilosec  You have been scheduled for a gastric emptying scan at [facility] on 07/24/14 at 1;00pm. Please arrive at least 15 minutes prior to your appointment for registration. Please make certain not to have anything to eat or drink after midnight the night before your test. Hold all stomach medications (ex: Zofran, phenergan, Reglan) 48 hours prior to your test. If you need to reschedule your appointment, please contact radiology scheduling at [phone]. _____________________________________________________________________ A gastric-emptying study measures how long it takes for food to move through your stomach. There are several ways to measure stomach emptying. In the most common test, you eat food that contains a small amount of radioactive material. A scanner that detects the movement of the radioactive material is placed over your abdomen to monitor the rate at which food leaves your stomach. This test normally takes about 2 hours to complete. _____________________________________________________________________   I appreciate the opportunity to care for you.

## 2014-07-08 NOTE — Progress Notes (Signed)
   Subjective:    Patient ID: Sandra Miller, female    DOB: 29-Sep-1975, 38 y.o.   MRN: [id number]  HPI  The patient is here for f/u of "GERD". She has had chest pain, reflux sx of burning epigastric pain x years. This occurs despite the use of multiple PPI's and carafate.  Only help is GI cocktail and that is short-lived.  Multiple EGD's and imaging studies. H. Pylori negative. No dysphagia.  If she misses omeprazole dose she does get acid rebound sxs.  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  Medications, allergies, past medical history, past surgical history, family history and social history are reviewed and updated in the EMR.  Review of Systems     Objective:   Physical Exam Overweight black woman NAD    Assessment & Plan:  Gastroesophageal reflux disease without esophagitis - Plan: NM Gastric Emptying  Dyspepsia - ? functional  Schedule GES to evaluate for that contributing to refractory reflux. I have recommended manometry with 24 hr pH/Impedance but she wants to think about that. She says she could not come off omeprazole to do a Bravo pH probe off meds   Other considerations would be use of buspirone and/or duloxetine.   15 min time spent with the patient.

## 2014-07-16 ENCOUNTER — Encounter (HOSPITAL_COMMUNITY): Payer: Self-pay | Admitting: Psychiatry

## 2014-07-16 ENCOUNTER — Ambulatory Visit (INDEPENDENT_AMBULATORY_CARE_PROVIDER_SITE_OTHER): Payer: Medicare Other | Admitting: Psychiatry

## 2014-07-16 VITALS — BP 127/85 | HR 71 | Ht 65.5 in | Wt 189.0 lb

## 2014-07-16 DIAGNOSIS — F319 Bipolar disorder, unspecified: Secondary | ICD-10-CM | POA: Diagnosis not present

## 2014-07-16 MED ORDER — FLUOXETINE HCL 10 MG PO CAPS: ORAL_CAPSULE | ORAL | Status: DC

## 2014-07-16 MED ORDER — OLANZAPINE 10 MG PO TABS: 10.0000 mg | ORAL_TABLET | Freq: Every day | ORAL | Status: DC

## 2014-07-16 MED ORDER — TRAZODONE HCL 100 MG PO TABS: 100.0000 mg | ORAL_TABLET | Freq: Every day | ORAL | Status: DC

## 2014-07-16 MED ORDER — TOPIRAMATE 25 MG PO TABS: 50.0000 mg | ORAL_TABLET | Freq: Every day | ORAL | Status: DC

## 2014-07-16 NOTE — Progress Notes (Signed)
[name] Progress Note  Sandra Miller [id number] 38 y.o.  07/16/2014 3:21 PM  Chief Complaint  Patient presents with  . Follow-up  . Medication Refill    History of Present Illness: Sandra Miller came for the appointment.  She is taking her medication and denies any major panic attack or any aggressive behavior.  Her son was involved in a car accident when a drunk driver hit the car. Luckily his son is not injured. Patient told that his son and daughter is thinking to move back for few months to live with her so that they can save money.  Patient is excited but also anxious because finally she is learned to live by herself and she is enjoying her freedom.  She has tried first semester but will start again in January.  She likes her job.  She denies any paranoia or any hallucination.  Her appetite is good.  She sleeping good.  She is watching her calorie intake and her weight is stable.  Patient denies any drinking or using any illegal substances. Her job is going very well.  She continues to see her grandchild and tried to keep herself busy and active.  Patient is working as a Sandra Miller.   She is also a Sandra Miller at [employer].  Suicidal Ideation: No Plan Formed: No Patient has means to carry out plan: No  Homicidal Ideation: No Plan Formed: No Patient has means to carry out plan: No  ROS  Psychiatric: Agitation: No Hallucination: No Depressed Mood: No Insomnia: No Hypersomnia: No Altered Concentration: No Feels Worthless: No Grandiose Ideas: No Belief In Special Powers: No New/Increased Substance Abuse: No Compulsions: No  Neurologic: Headache: Yes Seizure: No Paresthesias: No  Medical history. Patient has history of hypertension , migraine headache and GERD.    Psychosocial history . Patient lives with her 59 year old son.  She's working as a Sandra Miller .  Her daughter recently moved out .     Outpatient Encounter Prescriptions as of 07/16/2014  Medication  Sig  . acetaminophen (TYLENOL) 325 MG tablet Take 2 tablets (650 mg total) by mouth every 6 (six) hours as needed.  Marland Kitchen FLUoxetine (PROZAC) 10 MG capsule TAKE 3 CAPSULES DAILY.  Marland Kitchen imiquimod (ALDARA) 5 % cream Apply topically 3 (three) times a week.  Marland Kitchen LORazepam (ATIVAN) 1 MG tablet Take 1 tablet (1 mg total) by mouth at bedtime.  . Multiple Vitamin (MULTIVITAMIN) tablet Take 1 tablet by mouth daily.  Marland Kitchen OLANZapine (ZYPREXA) 10 MG tablet Take 1 tablet (10 mg total) by mouth at bedtime.  Marland Kitchen omeprazole (PRILOSEC) 40 MG capsule take 1 capsule by mouth twice a day  . topiramate (TOPAMAX) 25 MG tablet Take 2 tablets (50 mg total) by mouth at bedtime.  . traZODone (DESYREL) 100 MG tablet Take 1 tablet (100 mg total) by mouth at bedtime.  . valACYclovir (VALTREX) 500 MG tablet Take 1 tablet (500 mg total) by mouth daily.  . [DISCONTINUED] FLUoxetine (PROZAC) 10 MG capsule TAKE 3 CAPSULES DAILY.  . [DISCONTINUED] OLANZapine (ZYPREXA) 10 MG tablet Take 1 tablet (10 mg total) by mouth at bedtime.  . [DISCONTINUED] topiramate (TOPAMAX) 25 MG tablet Take 2 tablets (50 mg total) by mouth at bedtime.  . [DISCONTINUED] traZODone (DESYREL) 100 MG tablet Take 1 tablet (100 mg total) by mouth at bedtime.    Past Psychiatric History/Hospitalization(s): Patient has history of bipolar disorder, alcohol dependence and marijuana abuse.  She has seen in this office on and off since 2008.  She is at lease 6 psychiatric hospitalization.  She's been admitted in behavioral [place] in October 2008.  In the past she had tried lithium, Seroquel, Lamictal, Paxil, Celexa, Depakote, Neurontin and Cymbalta with limited response.   Anxiety: Yes Bipolar Disorder: Yes Depression: Yes Mania: Yes Psychosis: Yes Schizophrenia: No Personality Disorder: No Hospitalization for psychiatric illness: Yes History of Electroconvulsive Shock Therapy: No Prior Suicide Attempts: No  Physical Exam: Constitutional:  BP 127/85 mmHg   Pulse 71  Ht 5' 5.5" (1.664 m)  Wt 189 lb (85.73 kg)  BMI 30.96 kg/m2  General Appearance: well nourished,   Musculoskeletal: Strength & Muscle Tone: within normal limits Gait & Station: normal Patient leans: N/A  Mental status examination Patient is casually dressed and fairly groomed.  She is calm and cooperative. her speech is spontaneous, clear and coherent  She describes her mood is euthymic and affect is appropriate.  She has no tremors or shakes.  She denies any auditory or visual hallucination.  She has no paranoia or any obsessive thoughts.  She denies any active or passive suicidal thoughts or homicidal thoughts.  Her fund of knowledge is adequate.  Her memory is intact.  Her psychomotor activity is slightly slow.  She has no tremors or shakes.  She is alert and oriented x3.  Her insight judgment and impulse control is okay.  Established Problem, Stable/Improving (1), Review of Psycho-Social Stressors (1), Review of Last Therapy Session (1) and Review of Medication Regimen & Side Effects (2)  Assessment: Axis I: Bipolar disorder, NOS  Axis II: Deferred  Axis III:  Patient Active Problem List   Diagnosis Date Noted  . Genital warts 05/20/2014  . Epidermal inclusion cyst 04/29/2014  . Paresthesia of left upper limb 12/26/2013  . Atypical chest pain 04/22/2013  . Vaginal discharge 03/13/2013  . Left knee pain 01/09/2013  . GERD (gastroesophageal reflux disease) 12/26/2012  . Stomach burning 12/26/2012  . Right wrist pain 11/16/2012  . Recurrent boils 09/14/2012  . Tobacco abuse 08/22/2012  . Esophageal dysphagia 10/28/2011  . HELICOBACTER PYLORI GASTRITIS, HX OF 03/20/2009  . HYPERTENSION NEC 03/19/2009  . NEPHROLITHIASIS, HX OF 03/19/2009  . HIATAL HERNIA WITH REFLUX 03/16/2009  . HSV 02/03/2007  . DISORDER, BIPOLAR NOS 02/03/2007  . HX, PERSONAL, GENITAL/OBSTETRIC DISORDER NEC 02/03/2007    Axis IV: Moderate  Axis V: 60-65   Plan:  Patient is currently  stable on her medication.  She is taking Ativan most of the nights.  She does not asked for early refills.  She still have refills remaining on her Ativan.  I will continue Zyprexa 10 mg at bedtime, Topamax 50 mg at bedtime and trazodone 100 mg at bedtime.  I recommended to call us back if she needed a refill on Ativan.  I discussed medication side effects and benefits.  Recommended to call us back if she has any question or any concern.  I will see her again in 2 months.  ARFEEN,SYED T., MD 07/16/2014

## 2014-07-24 ENCOUNTER — Ambulatory Visit (HOSPITAL_COMMUNITY): Payer: Medicare Other

## 2014-07-25 ENCOUNTER — Encounter: Payer: Self-pay | Admitting: Family Medicine

## 2014-07-25 ENCOUNTER — Ambulatory Visit (HOSPITAL_COMMUNITY)
Admission: RE | Admit: 2014-07-25 | Discharge: 2014-07-25 | Disposition: A | Discharge status: Home / Self Care | Payer: Medicare Other | Source: Ambulatory Visit | Attending: Family Medicine | Admitting: Family Medicine

## 2014-07-25 ENCOUNTER — Ambulatory Visit (INDEPENDENT_AMBULATORY_CARE_PROVIDER_SITE_OTHER): Payer: Medicare Other | Admitting: Family Medicine

## 2014-07-25 VITALS — BP 134/72 | HR 92 | Temp 98.2°F | Ht 65.5 in | Wt 185.0 lb

## 2014-07-25 DIAGNOSIS — R002 Palpitations: Secondary | ICD-10-CM | POA: Insufficient documentation

## 2014-07-25 NOTE — Patient Instructions (Signed)
Nice to meet you. Your palpitations may be related to an arrhythmia vs anxiety.  If you have an episode of this that lasts for longer than a few minutes, you develop chest pain, shortness of breath, or feel as though you are going to pass out please seek medical attention.  We are going to check blood work and let you know the results.

## 2014-07-25 NOTE — Assessment & Plan Note (Addendum)
Patient with recent onset of palpitations. No associated chest pain or shortness of breath. Differential includes arrhythmia (specifically SVT) vs panic attack (given her anxiety history). EKG without evidence of arrhythmia. Will check TSH and BMET. Advised on stopping caffeine use. Discussed that if this were to continue she would likely need an event monitor to capture one of these events to characterize what rhythm she is in during this. Given return precautions. F/u in one month with PCP.

## 2014-07-25 NOTE — Progress Notes (Signed)
Patient ID: Sandra Miller, female   DOB: 1975-11-03, 38 y.o.   MRN: [id number]  Tommi Rumps, MD Phone: [phone]  Sandra Miller is a 38 y.o. female who presents today for same day appointment.   Palpitations: patient reports onset of palpitations 2 days ago. Notes they came on with no precipitant. Last for 45 minutes. Felt as though her heart was going to beat out of her chest. No chest pain or shortness of breath with this. She started to panic toward the end, though they resolved on their own. She had recurrence yesterday while at a clients house and they advised her to sit down. She had diaphoresis and felt as though she was going to pass out with this episode as well as nausea. No chest pain or shortness of breath with this. She has had this previously when she was drinking lots of alcohol and her electrolytes were abnormal. She denies syncope, cough, pain with deep breaths, swelling in her legs, heart burn, and recent period of immobility. She denies other prior cardiac issues. She was previously seen by Dr Johnsie Cancel for her blood pressure. She is not currently on blood pressure medication. She drinks 6 coca-cola's a day. Denies taking herbal supplements and diet pills. Notes infrequently smoking marijuana. No other illicit drug use.   Patient is a smoker.   ROS: Per HPI   Physical Exam Filed Vitals:   07/25/14 1733  BP: 134/72  Pulse:   Temp:     Gen: Well NAD HEENT: PERRL,  MMM Lungs: CTABL Nl WOB Heart: RRR no MRG Exts: Non edematous BL  LE, warm and well perfused.   EKG NSR rate of 79, no apparent delta waves, non-specific T-wave abnormalities  Assessment/Plan: Please see individual problem list.  Tommi Rumps, MD [place] PGY-3

## 2014-07-26 ENCOUNTER — Telehealth: Payer: Self-pay | Admitting: Family Medicine

## 2014-07-26 LAB — BASIC METABOLIC PANEL
BUN: 9 mg/dL (ref 6–23)
CALCIUM: 9.4 mg/dL (ref 8.4–10.5)
CHLORIDE: 106 meq/L (ref 96–112)
CO2: 22 meq/L (ref 19–32)
CREATININE: 0.79 mg/dL (ref 0.50–1.10)
Glucose, Bld: 70 mg/dL (ref 70–99)
Potassium: 3.4 mEq/L — ABNORMAL LOW (ref 3.5–5.3)
Sodium: 140 mEq/L (ref 135–145)

## 2014-07-26 LAB — TSH: TSH: 0.429 u[IU]/mL (ref 0.350–4.500)

## 2014-07-26 NOTE — Telephone Encounter (Signed)
Called patient to inform of lab results. Potassium was 3.4. This is mildly low. Advised that this is unlikely to cause her palpitations, though we would like to see this more in the normal range. Advised her to increase her leafy greens, bananas, and yogurt intake over the weekend. Advised her of return precautions that were discussed at the visit yesterday as well.

## 2014-07-28 ENCOUNTER — Other Ambulatory Visit (HOSPITAL_COMMUNITY)
Admission: RE | Admit: 2014-07-28 | Discharge: 2014-07-28 | Disposition: A | Discharge status: Home / Self Care | Payer: Medicare Other | Source: Ambulatory Visit | Attending: Family Medicine | Admitting: Family Medicine

## 2014-07-28 ENCOUNTER — Ambulatory Visit (INDEPENDENT_AMBULATORY_CARE_PROVIDER_SITE_OTHER): Payer: Medicare Other | Admitting: Family Medicine

## 2014-07-28 VITALS — BP 151/83 | HR 89 | Temp 98.5°F | Ht 65.5 in | Wt 189.2 lb

## 2014-07-28 DIAGNOSIS — N898 Other specified noninflammatory disorders of vagina: Secondary | ICD-10-CM

## 2014-07-28 DIAGNOSIS — Z113 Encounter for screening for infections with a predominantly sexual mode of transmission: Secondary | ICD-10-CM | POA: Insufficient documentation

## 2014-07-28 DIAGNOSIS — Z202 Contact with and (suspected) exposure to infections with a predominantly sexual mode of transmission: Secondary | ICD-10-CM | POA: Diagnosis not present

## 2014-07-28 LAB — POCT WET PREP (WET MOUNT): Clue Cells Wet Prep Whiff POC: NEGATIVE

## 2014-07-29 ENCOUNTER — Telehealth: Payer: Self-pay | Admitting: *Deleted

## 2014-07-29 LAB — CERVICOVAGINAL ANCILLARY ONLY
Chlamydia: NEGATIVE
Neisseria Gonorrhea: NEGATIVE

## 2014-07-29 MED ORDER — FLUCONAZOLE 150 MG PO TABS: 150.0000 mg | ORAL_TABLET | Freq: Once | ORAL | Status: DC

## 2014-07-29 MED ORDER — METRONIDAZOLE 0.75 % VA GEL: 1.0000 | Freq: Two times a day (BID) | VAGINAL | Status: DC

## 2014-07-29 NOTE — Telephone Encounter (Signed)
-----   Message from Sandra Pour, MD sent at 07/29/2014  4:25 PM EST ----- Please inform her that the full results are not back yet but that metrogel has been sent to the pharmacy for BV. She should take this as directed for 5 days THEN take the diflucan. Thank you!

## 2014-07-29 NOTE — Assessment & Plan Note (Signed)
Recurrent problem. Clinical evidence of yeast infection and clue cells on wet prep.  - Metrogel followed by diflucan po x1 - Will contact with full results of STI screening - Follow up prn no improvement/worsening

## 2014-07-29 NOTE — Telephone Encounter (Signed)
Pt informed of below.  Also informed of neg GC/Chlamydia since they were back when i called. Sandra Miller, Sandra Miller

## 2014-07-29 NOTE — Progress Notes (Signed)
   Subjective:  Sandra Miller is a 38 y.o. female presenting to same day clinic for vaginal discharge.  Thick white vaginal discharge has been present for 5 days with some irritation without burning or itching. Denies odor, fever, dysuria, vaginal bleeding, abdominal or pelvic pain, rash, dyspareunia. No regular periods s/p endometrial ablation, uses condoms with single female partner with whom she is currently sexually active. + history of frequent BV and yeast infections. Nothing tried. She would also like additional STI testing.  - Review of Systems: Per HPI.  - Smoking status noted Objective:  BP 151/83 mmHg  Pulse 89  Temp(Src) 98.5 F (36.9 C) (Oral)  Ht 5' 5.5" (1.664 m)  Wt 189 lb 3.2 oz (85.821 kg)  BMI 30.99 kg/m2 Gen:  38 y.o. female in no distress Pelvic: External genitalia within normal limits with curd-like thick white discharge present in introitus. Vaginal mucosa pink, moist, normal rugae.  Nonfriable cervix without lesions, no bleeding noted on speculum exam.  Bimanual exam revealed normal, nongravid uterus.  No cervical motion tenderness. No adnexal masses bilaterally.   Leonia Corona, CMA present throughout duration of exam.  Assessment/Plan:  Sandra Miller is a 38 y.o. female here for vaginal discharge consistent clinically with yeast infection with wet prep evidence of recurrent BV. Tx metrogel x 5 days then diflucan po x1.

## 2014-08-20 ENCOUNTER — Encounter: Payer: Self-pay | Admitting: Family Medicine

## 2014-08-20 ENCOUNTER — Encounter: Payer: Self-pay | Admitting: *Deleted

## 2014-08-20 ENCOUNTER — Ambulatory Visit (INDEPENDENT_AMBULATORY_CARE_PROVIDER_SITE_OTHER): Payer: Medicare Other | Admitting: Family Medicine

## 2014-08-20 VITALS — BP 150/90 | HR 67 | Temp 98.3°F | Ht 65.5 in | Wt 185.0 lb

## 2014-08-20 DIAGNOSIS — R1030 Lower abdominal pain, unspecified: Secondary | ICD-10-CM

## 2014-08-20 LAB — POCT URINALYSIS DIPSTICK
Bilirubin, UA: NEGATIVE
Glucose, UA: NEGATIVE
Ketones, UA: NEGATIVE
Nitrite, UA: NEGATIVE
PH UA: 7
Spec Grav, UA: 1.01
UROBILINOGEN UA: 0.2

## 2014-08-20 LAB — POCT UA - MICROSCOPIC ONLY

## 2014-08-20 MED ORDER — DOCUSATE SODIUM 100 MG PO CAPS: 100.0000 mg | ORAL_CAPSULE | Freq: Two times a day (BID) | ORAL | Status: DC

## 2014-08-20 MED ORDER — POLYETHYLENE GLYCOL 3350 17 GM/SCOOP PO POWD: 17.0000 g | Freq: Two times a day (BID) | ORAL | Status: DC | PRN

## 2014-08-20 MED ORDER — TRAMADOL HCL 50 MG PO TABS
50.0000 mg | ORAL_TABLET | Freq: Three times a day (TID) | ORAL | Status: DC | PRN
Start: 2014-08-20 — End: 2015-03-24

## 2014-08-20 NOTE — Patient Instructions (Signed)
Constipation  Drink plenty of fluid, preferably water, throughout the day  Eat foods high in fiber such as fruits, vegetables, and grains  Exercise, such as walking, is a good way to keep your bowels regular  Drink warm fluids, especially warm prune juice, or decaf coffee  Eat a 1/2 cup of real oatmeal (not instant), 1/2 cup applesauce, and 1/2-1 cup warm prune juice every day  If needed, you may take Colace (docusate sodium) stool softener once or twice a day to help keep the stool soft.  If you still are having problems with constipation, you may take Miralax once daily as needed to help keep your bowels regular.    Please make sure to drink plenty of water. If pain persistent or worsens please make appointment to see [place] soon Bernadene Bell, MD

## 2014-08-20 NOTE — Progress Notes (Signed)
Patient ID: Sandra Miller, female   DOB: [birth year], 38 y.o.   MRN: [id number]   [facility] Bernadene Bell, MD Phone: [phone]  Subjective:   Sandra Miller is a 38 y.o F who presents with nausea and back pain. Has hx of kidney stones in the past. Started to feel increased pressure in her abdomen for the last several days then noted back pain. Has been taking tylenol with no relief. No fevers, chill or emesis. Also has had some constipation for the last several weeks. Last BM this morning: some straining involved. Increased pain with BM. Has not noted frequency or urgency but also "has not been keeping track"  All relevant systems were reviewed and were negative unless otherwise noted in the HPI  Past Medical History Reviewed problem list.  Medications- reviewed and updated Current Outpatient Prescriptions  Medication Sig Dispense Refill  . acetaminophen (TYLENOL) 325 MG tablet Take 2 tablets (650 mg total) by mouth every 6 (six) hours as needed. 30 tablet 0  . fluconazole (DIFLUCAN) 150 MG tablet Take 1 tablet (150 mg total) by mouth once. 1 tablet 0  . FLUoxetine (PROZAC) 10 MG capsule TAKE 3 CAPSULES DAILY. 90 capsule 1  . imiquimod (ALDARA) 5 % cream Apply topically 3 (three) times a week. 12 each 0  . LORazepam (ATIVAN) 1 MG tablet Take 1 tablet (1 mg total) by mouth at bedtime. 30 tablet 1  . metroNIDAZOLE (METROGEL) 0.75 % vaginal gel Place 1 Applicatorful vaginally 2 (two) times daily. for 5 days 70 g 0  . Multiple Vitamin (MULTIVITAMIN) tablet Take 1 tablet by mouth daily.    Marland Kitchen OLANZapine (ZYPREXA) 10 MG tablet Take 1 tablet (10 mg total) by mouth at bedtime. 30 tablet 1  . omeprazole (PRILOSEC) 40 MG capsule take 1 capsule by mouth twice a day 60 capsule 11  . topiramate (TOPAMAX) 25 MG tablet Take 2 tablets (50 mg total) by mouth at bedtime. 60 tablet 1  . traZODone (DESYREL) 100 MG tablet Take 1 tablet (100 mg total) by mouth at bedtime. 30 tablet 1  .  valACYclovir (VALTREX) 500 MG tablet Take 1 tablet (500 mg total) by mouth daily. 30 tablet 2   No current facility-administered medications for this visit.   Chief complaint-noted No additions to family history Social history- patient is a current smoker  Objective: BP 150/90 mmHg  Pulse 67  Temp(Src) 98.3 F (36.8 C) (Oral)  Ht 5' 5.5" (1.664 m)  Wt 185 lb (83.915 kg)  BMI 30.31 kg/m2 Gen: NAD, alert, cooperative with exam Abd: SND, BS present, no guarding or organomegaly, tender in the suprapubic regions Ext: bilat lower back pain does not lateralize or localize; no spinous process tenderness  Neuro: Alert and oriented, No gross deficits Skin: no rashes no lesions  Assessment/Plan: 1. Lower abdominal pain - POCT urinalysis dipstick- with RBCs - POCT UA - Microscopic Only - Urine culture -potentially related to passage of kidney stone -may also be exacerbated by constipation -advised bowel regimen (miralax and colace) -plenty of fluids -rtc if worsens, may consider LB imaging at that time -doubt pyleo -recently screened for GCCT- neg  Bernadene Bell, MD Family Medicine PGY-2 Please page or call with questions

## 2014-08-22 LAB — URINE CULTURE
Colony Count: NO GROWTH
Organism ID, Bacteria: NO GROWTH

## 2014-09-15 ENCOUNTER — Ambulatory Visit (INDEPENDENT_AMBULATORY_CARE_PROVIDER_SITE_OTHER): Payer: Medicare Other | Admitting: Psychiatry

## 2014-09-15 ENCOUNTER — Encounter (HOSPITAL_COMMUNITY): Payer: Self-pay | Admitting: Psychiatry

## 2014-09-15 VITALS — BP 127/76 | HR 93 | Ht 65.5 in | Wt 189.6 lb

## 2014-09-15 DIAGNOSIS — F319 Bipolar disorder, unspecified: Secondary | ICD-10-CM

## 2014-09-15 MED ORDER — FLUOXETINE HCL 10 MG PO CAPS: ORAL_CAPSULE | ORAL | Status: DC

## 2014-09-15 MED ORDER — LORAZEPAM 1 MG PO TABS: 1.0000 mg | ORAL_TABLET | Freq: Every day | ORAL | Status: DC

## 2014-09-15 MED ORDER — TOPIRAMATE 25 MG PO TABS: 50.0000 mg | ORAL_TABLET | Freq: Every day | ORAL | Status: DC

## 2014-09-15 MED ORDER — OLANZAPINE 10 MG PO TABS: 10.0000 mg | ORAL_TABLET | Freq: Every day | ORAL | Status: DC

## 2014-09-15 MED ORDER — TRAZODONE HCL 100 MG PO TABS: 100.0000 mg | ORAL_TABLET | Freq: Every day | ORAL | Status: DC

## 2014-09-15 NOTE — Progress Notes (Signed)
[name] [phone] Progress Note  Sandra Miller [id number] 39 y.o.  09/15/2014 3:38 PM  Chief Complaint  Patient presents with  . Follow-up  . Medication Refill    History of Present Illness: [name] came for the appointment.  She had a good Christmas.  She is taking her medication as prescribed.  She denies any irritability, anger or any mood swing.  She is upset on his son who is working at tattoo shop .  She is happy with her daughter who is in college.  Patient is going to start school after a semester break.  She will start Wednesday at [employer].  Overall she's been doing better.  She is relieved that her son is not moving in and at least working rather than than unemployed.  Patient denies any paranoia or any hallucination.  She recently had kidney pain and she was given tramadol but she is not taking it on a regular basis.  Patient denies drinking or using any illegal substances.  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  Her appetite is okay.  Her vitals are stable.  Suicidal Ideation: No Plan Formed: No Patient has means to carry out plan: No  Homicidal Ideation: No Plan Formed: No Patient has means to carry out plan: No  ROS  Psychiatric: Agitation: No Hallucination: No Depressed Mood: No Insomnia: No Hypersomnia: No Altered Concentration: No Feels Worthless: No Grandiose Ideas: No Belief In Special Powers: No New/Increased Substance Abuse: No Compulsions: No  Neurologic: Headache: Yes Seizure: No Paresthesias: No  Medical history. Patient has history of hypertension , migraine headache and GERD.    Psychosocial history . Patient lives with her 55 year old son.  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     Outpatient Encounter Prescriptions as of 09/15/2014  Medication Sig  . acetaminophen (TYLENOL) 325 MG tablet Take 2 tablets (650 mg total) by mouth every 6 (six) hours as needed.  . docusate sodium (COLACE) 100 MG capsule  Take 1 capsule (100 mg total) by mouth 2 (two) times daily.  . fluconazole (DIFLUCAN) 150 MG tablet Take 1 tablet (150 mg total) by mouth once.  Marland Kitchen FLUoxetine (PROZAC) 10 MG capsule TAKE 3 CAPSULES DAILY.  Marland Kitchen imiquimod (ALDARA) 5 % cream Apply topically 3 (three) times a week.  Marland Kitchen LORazepam (ATIVAN) 1 MG tablet Take 1 tablet (1 mg total) by mouth at bedtime.  . metroNIDAZOLE (METROGEL) 0.75 % vaginal gel Place 1 Applicatorful vaginally 2 (two) times daily. for 5 days  . Multiple Vitamin (MULTIVITAMIN) tablet Take 1 tablet by mouth daily.  Marland Kitchen OLANZapine (ZYPREXA) 10 MG tablet Take 1 tablet (10 mg total) by mouth at bedtime.  Marland Kitchen omeprazole (PRILOSEC) 40 MG capsule take 1 capsule by mouth twice a day  . polyethylene glycol powder (GLYCOLAX/MIRALAX) powder Take 17 g by mouth 2 (two) times daily as needed.  . topiramate (TOPAMAX) 25 MG tablet Take 2 tablets (50 mg total) by mouth at bedtime.  . traMADol (ULTRAM) 50 MG tablet Take 1 tablet (50 mg total) by mouth every 8 (eight) hours as needed.  . traZODone (DESYREL) 100 MG tablet Take 1 tablet (100 mg total) by mouth at bedtime.  . valACYclovir (VALTREX) 500 MG tablet Take 1 tablet (500 mg total) by mouth daily.  . [DISCONTINUED] FLUoxetine (PROZAC) 10 MG capsule TAKE 3 CAPSULES DAILY.  . [DISCONTINUED] LORazepam (ATIVAN) 1 MG tablet Take 1 tablet (1 mg total) by mouth at bedtime.  . [  DISCONTINUED] OLANZapine (ZYPREXA) 10 MG tablet Take 1 tablet (10 mg total) by mouth at bedtime.  . [DISCONTINUED] topiramate (TOPAMAX) 25 MG tablet Take 2 tablets (50 mg total) by mouth at bedtime.  . [DISCONTINUED] traZODone (DESYREL) 100 MG tablet Take 1 tablet (100 mg total) by mouth at bedtime.    Past Psychiatric History/Hospitalization(s): Patient has history of bipolar disorder, alcohol dependence and marijuana abuse.  She has seen in this office on and off since 2008.  She is at [facility] psychiatric [facility].  She's been admitted in behavioral [facility]  in October 2008.  In the past she had tried lithium, Seroquel, Lamictal, Paxil, Celexa, Depakote, Neurontin and Cymbalta with limited response.   Anxiety: Yes Bipolar Disorder: Yes Depression: Yes Mania: Yes Psychosis: Yes Schizophrenia: No Personality Disorder: No Hospitalization for psychiatric illness: Yes History of Electroconvulsive Shock Therapy: No Prior Suicide Attempts: No  Physical Exam: Constitutional:  BP 127/76 mmHg  Pulse 93  Ht 5' 5.5" (1.664 m)  Wt 189 lb 9.6 oz (86.002 kg)  BMI 31.06 kg/m2  General Appearance: well nourished,   Musculoskeletal: Strength & Muscle Tone: within normal limits Gait & Station: normal Patient leans: N/A  Mental status examination Patient is casually dressed and fairly groomed.  She is calm and cooperative. Her speech is spontaneous, clear and coherent  She describes her mood is tired and affect is appropriate.  She has no tremors or shakes.  She denies any auditory or visual hallucination.  She has no paranoia or any obsessive thoughts.  She denies any active or passive suicidal thoughts or homicidal thoughts.  Her fund of knowledge is adequate.  Her memory is intact.  Her psychomotor activity is normal.  She has no tremors or shakes.  She is alert and oriented x3.  Her insight judgment and impulse control is okay.  Established Problem, Stable/Improving (1), Review of Psycho-Social Stressors (1), Review of Last Therapy Session (1) and Review of Medication Regimen & Side Effects (2)  Assessment: Axis I: Bipolar disorder, NOS  Axis II: Deferred  Axis III:  Patient Active Problem List   Diagnosis Date Noted  . Palpitations 07/25/2014  . Genital warts 05/20/2014  . Epidermal inclusion cyst 04/29/2014  . Paresthesia of left upper limb 12/26/2013  . Atypical chest pain 04/22/2013  . Vaginal discharge 03/13/2013  . Left knee pain 01/09/2013  . GERD (gastroesophageal reflux disease) 12/26/2012  . Stomach burning 12/26/2012  . Right  wrist pain 11/16/2012  . Recurrent boils 09/14/2012  . Tobacco abuse 08/22/2012  . Esophageal dysphagia 10/28/2011  . HELICOBACTER PYLORI GASTRITIS, HX OF 03/20/2009  . HYPERTENSION NEC 03/19/2009  . NEPHROLITHIASIS, HX OF 03/19/2009  . HIATAL HERNIA WITH REFLUX 03/16/2009  . HSV 02/03/2007  . DISORDER, BIPOLAR NOS 02/03/2007  . HX, PERSONAL, GENITAL/OBSTETRIC DISORDER NEC 02/03/2007    Axis IV: Moderate  Axis V: 60-65   Plan:  Patient is currently stable on her medication.  She likes her medication.  She is taking Ativan 1 mg at bedtime which is helping her sleep.  She does not asked for early refills. I will continue Zyprexa 10 mg at bedtime, Topamax 50 mg at bedtime and trazodone 100 mg at bedtime.  Recommended to call us back if she has any question, concern or if she feels worsening of the symptoms.  Discussed medication side effects and benefits. I will see her again in 3 months.  Trystin Terhune T., MD 09/15/2014

## 2014-10-16 ENCOUNTER — Encounter (HOSPITAL_COMMUNITY): Payer: Self-pay | Admitting: Emergency Medicine

## 2014-10-16 ENCOUNTER — Emergency Department (HOSPITAL_COMMUNITY): Payer: Medicare Other

## 2014-10-16 ENCOUNTER — Emergency Department (HOSPITAL_COMMUNITY)
Admission: EM | Admit: 2014-10-16 | Discharge: 2014-10-16 | Disposition: A | Discharge status: Home / Self Care | Payer: Medicare Other | Attending: Emergency Medicine | Admitting: Emergency Medicine

## 2014-10-16 DIAGNOSIS — I4581 Long QT syndrome: Secondary | ICD-10-CM | POA: Diagnosis not present

## 2014-10-16 DIAGNOSIS — F17210 Nicotine dependence, cigarettes, uncomplicated: Secondary | ICD-10-CM | POA: Diagnosis not present

## 2014-10-16 DIAGNOSIS — Z720 Tobacco use: Secondary | ICD-10-CM | POA: Diagnosis not present

## 2014-10-16 DIAGNOSIS — Z79899 Other long term (current) drug therapy: Secondary | ICD-10-CM | POA: Insufficient documentation

## 2014-10-16 DIAGNOSIS — R05 Cough: Secondary | ICD-10-CM | POA: Diagnosis not present

## 2014-10-16 DIAGNOSIS — R0602 Shortness of breath: Secondary | ICD-10-CM | POA: Diagnosis not present

## 2014-10-16 DIAGNOSIS — R079 Chest pain, unspecified: Secondary | ICD-10-CM | POA: Diagnosis not present

## 2014-10-16 DIAGNOSIS — M25512 Pain in left shoulder: Secondary | ICD-10-CM | POA: Diagnosis present

## 2014-10-16 DIAGNOSIS — F319 Bipolar disorder, unspecified: Secondary | ICD-10-CM | POA: Insufficient documentation

## 2014-10-16 DIAGNOSIS — F419 Anxiety disorder, unspecified: Secondary | ICD-10-CM | POA: Diagnosis not present

## 2014-10-16 DIAGNOSIS — Z87442 Personal history of urinary calculi: Secondary | ICD-10-CM | POA: Insufficient documentation

## 2014-10-16 DIAGNOSIS — K219 Gastro-esophageal reflux disease without esophagitis: Secondary | ICD-10-CM | POA: Diagnosis not present

## 2014-10-16 DIAGNOSIS — I10 Essential (primary) hypertension: Secondary | ICD-10-CM | POA: Diagnosis not present

## 2014-10-16 DIAGNOSIS — R9431 Abnormal electrocardiogram [ECG] [EKG]: Secondary | ICD-10-CM

## 2014-10-16 DIAGNOSIS — D72829 Elevated white blood cell count, unspecified: Secondary | ICD-10-CM

## 2014-10-16 DIAGNOSIS — R0789 Other chest pain: Secondary | ICD-10-CM | POA: Diagnosis not present

## 2014-10-16 LAB — I-STAT TROPONIN, ED: Troponin i, poc: 0 ng/mL (ref 0.00–0.08)

## 2014-10-16 LAB — BASIC METABOLIC PANEL
ANION GAP: 6 (ref 5–15)
BUN: 12 mg/dL (ref 6–23)
CHLORIDE: 108 mmol/L (ref 96–112)
CO2: 24 mmol/L (ref 19–32)
Calcium: 9.6 mg/dL (ref 8.4–10.5)
Creatinine, Ser: 0.94 mg/dL (ref 0.50–1.10)
GFR calc Af Amer: 87 mL/min — ABNORMAL LOW (ref >90)
GFR calc non Af Amer: 75 mL/min — ABNORMAL LOW (ref >90)
Glucose, Bld: 101 mg/dL — ABNORMAL HIGH (ref 70–99)
POTASSIUM: 3.5 mmol/L (ref 3.5–5.1)
Sodium: 138 mmol/L (ref 135–145)

## 2014-10-16 LAB — I-STAT BETA HCG BLOOD, ED (MC, WL, AP ONLY)

## 2014-10-16 LAB — CBC WITH DIFFERENTIAL/PLATELET
BASOS PCT: 0 % (ref 0–1)
Basophils Absolute: 0 10*3/uL (ref 0.0–0.1)
EOS ABS: 0.1 10*3/uL (ref 0.0–0.7)
Eosinophils Relative: 0 % (ref 0–5)
HCT: 40.9 % (ref 36.0–46.0)
HEMOGLOBIN: 13.5 g/dL (ref 12.0–15.0)
LYMPHS ABS: 2.7 10*3/uL (ref 0.7–4.0)
Lymphocytes Relative: 16 % (ref 12–46)
MCH: 29.2 pg (ref 26.0–34.0)
MCHC: 33 g/dL (ref 30.0–36.0)
MCV: 88.5 fL (ref 78.0–100.0)
MONO ABS: 1 10*3/uL (ref 0.1–1.0)
Monocytes Relative: 6 % (ref 3–12)
NEUTROS ABS: 13.6 10*3/uL — AB (ref 1.7–7.7)
NEUTROS PCT: 78 % — AB (ref 43–77)
Platelets: 373 10*3/uL (ref 150–400)
RBC: 4.62 MIL/uL (ref 3.87–5.11)
RDW: 14.5 % (ref 11.5–15.5)
WBC: 17.3 10*3/uL — ABNORMAL HIGH (ref 4.0–10.5)

## 2014-10-16 LAB — I-STAT CG4 LACTIC ACID, ED: LACTIC ACID, VENOUS: 1.41 mmol/L (ref 0.5–2.0)

## 2014-10-16 LAB — D-DIMER, QUANTITATIVE: D-Dimer, Quant: 0.47 ug/mL-FEU (ref 0.00–0.48)

## 2014-10-16 MED ORDER — MORPHINE SULFATE 4 MG/ML IJ SOLN
4.0000 mg | Freq: Once | INTRAMUSCULAR | Status: AC
  Administered 2014-10-16: 4 mg via INTRAVENOUS
  Filled 2014-10-16: qty 1

## 2014-10-16 MED ORDER — HYDROCODONE-ACETAMINOPHEN 5-325 MG PO TABS: ORAL_TABLET | ORAL | Status: DC

## 2014-10-16 MED ORDER — ONDANSETRON HCL 4 MG/2ML IJ SOLN
4.0000 mg | Freq: Once | INTRAMUSCULAR | Status: AC
  Administered 2014-10-16: 4 mg via INTRAVENOUS
  Filled 2014-10-16: qty 2

## 2014-10-16 NOTE — ED Provider Notes (Signed)
CSN: [id number]     Arrival date & time 10/16/14  1241 History  This chart was scribed for Monico Blitz, PA-C working with Orpah Greek, * by Mercy Moore, ED Scribe. This patient was seen in room WTR6/WTR6 and the patient's care was started at 1:20 PM.   Chief Complaint  Patient presents with  . Shoulder Pain   The history is provided by the patient. No language interpreter was used.   HPI Comments: Sandra Miller is a 39 y.o. female who presents to the Emergency Department complaining of constant pain inferior to her right breast, onset two days ago upon wakening. Patient reports radiation into her right shoulder blade and right back. Patient denies true pain in her breast. Patient reports treatment with Tylenol without relief. Patient denies alleviating or aggravating factors. Patient denies recent heavy lifting or known causative trauma. Patient is an admitted smoker and reports cough and shortness of breath; patient denies any changes since onset of her symptoms.   Past Medical History  Diagnosis Date  . Anxiety   . Hypertension     history of  . GERD (gastroesophageal reflux disease)   . Hiatal hernia   . HSV   . DISORDER, BIPOLAR NOS   . ALCOHOLISM     history of  . DISORDER, TOBACCO USE   . SUBSTANCE ABUSE     history of  . CONSTIPATION   . Abdominal pain, unspecified site   . EPIGASTRIC PAIN   . HELICOBACTER PYLORI GASTRITIS, HX OF   . NEPHROLITHIASIS, HX OF   . HX, PERSONAL, GENITAL/OBSTETRIC DISORDER NEC   . Esophageal dysphagia    Past Surgical History  Procedure Laterality Date  . Cesarean section  1995  . Knee arthroscopy      left  . Tubal ligation  1995  . [facility]   . Esophagogastroduodenoscopy    . Colonoscopy     Family History  Problem Relation Age of Onset  . Stomach cancer Paternal Grandfather   . Diabetes Maternal Grandfather   . Diabetes Paternal Grandmother   . Heart disease Father   . Lung  cancer Father   . Heart disease Mother   . Depression Mother   . Colon polyps Brother   . Kidney disease Maternal Uncle   . Cirrhosis Cousin     alcoholic  . Anxiety disorder Maternal Aunt   . Depression Maternal Aunt    History  Substance Use Topics  . Smoking status: Current Every Day Smoker -- 1.00 packs/day for 22 years    Types: Cigarettes  . Smokeless tobacco: Never Used     Comment: form given 10-27-11/patches are not covered under insurance  . Alcohol Use: No     Comment: sober for 9 months   OB History    No data available     Review of Systems  Constitutional: Negative for fever and chills.  Respiratory: Negative for cough and shortness of breath.   Cardiovascular: Positive for chest pain. Negative for leg swelling.  Musculoskeletal: Positive for myalgias.  Skin: Negative for color change.  Neurological: Negative for weakness and numbness.    Allergies  Aspirin and Minocycline  Home Medications   Prior to Admission medications   Medication Sig Start Date End Date Taking? Authorizing Provider  acetaminophen (TYLENOL) 325 MG tablet Take 2 tablets (650 mg total) by mouth every 6 (six) hours as needed. 11/25/13   Varney Biles, MD  docusate sodium (  COLACE) 100 MG capsule Take 1 capsule (100 mg total) by mouth 2 (two) times daily. 08/20/14   Bernadene Bell, MD  fluconazole (DIFLUCAN) 150 MG tablet Take 1 tablet (150 mg total) by mouth once. 07/29/14   Patrecia Pour, MD  FLUoxetine (PROZAC) 10 MG capsule TAKE 3 CAPSULES DAILY. 09/15/14   Kathlee Nations, MD  imiquimod (ALDARA) 5 % cream Apply topically 3 (three) times a week. 05/20/14   Lavon Paganini, MD  LORazepam (ATIVAN) 1 MG tablet Take 1 tablet (1 mg total) by mouth at bedtime. 09/15/14   Kathlee Nations, MD  metroNIDAZOLE (METROGEL) 0.75 % vaginal gel Place 1 Applicatorful vaginally 2 (two) times daily. for 5 days 07/29/14   Patrecia Pour, MD  Multiple Vitamin (MULTIVITAMIN) tablet Take 1 tablet by mouth daily.     Historical Provider, MD  OLANZapine (ZYPREXA) 10 MG tablet Take 1 tablet (10 mg total) by mouth at bedtime. 09/15/14 09/15/15  Kathlee Nations, MD  omeprazole (PRILOSEC) 40 MG capsule take 1 capsule by mouth twice a day 07/08/14   Gatha Mayer, MD  polyethylene glycol powder (GLYCOLAX/MIRALAX) powder Take 17 g by mouth 2 (two) times daily as needed. 08/20/14   Bernadene Bell, MD  topiramate (TOPAMAX) 25 MG tablet Take 2 tablets (50 mg total) by mouth at bedtime. 09/15/14   Kathlee Nations, MD  traMADol (ULTRAM) 50 MG tablet Take 1 tablet (50 mg total) by mouth every 8 (eight) hours as needed. 08/20/14   Bernadene Bell, MD  traZODone (DESYREL) 100 MG tablet Take 1 tablet (100 mg total) by mouth at bedtime. 09/15/14   Kathlee Nations, MD  valACYclovir (VALTREX) 500 MG tablet Take 1 tablet (500 mg total) by mouth daily. 10/09/13   Leeanne Rio, MD   Triage Vitals: BP 162/73 mmHg  Pulse 70  Resp 18  SpO2 100% Physical Exam  Constitutional: She is oriented to person, place, and time. She appears well-developed and well-nourished. No distress.  HENT:  Head: Normocephalic and atraumatic.  Mouth/Throat: Oropharynx is clear and moist.  Eyes: EOM are normal. Pupils are equal, round, and reactive to light.  Neck: Normal range of motion. Neck supple. No tracheal deviation present.  Cardiovascular: Normal rate, regular rhythm and normal heart sounds.  Exam reveals no gallop and no friction rub.   No murmur heard. Pulmonary/Chest: Effort normal and breath sounds normal. No respiratory distress. She has no wheezes. She has no rales. She exhibits no tenderness.  Patient has no tenderness to her chest.   Abdominal: Soft. Bowel sounds are normal. She exhibits no distension and no mass. There is no tenderness. There is no rebound and no guarding.  Musculoskeletal: Normal range of motion.  Neurological: She is alert and oriented to person, place, and time.  Skin: Skin is warm and dry.  Psychiatric: She has a  normal mood and affect. Her behavior is normal.  Nursing note and vitals reviewed.   ED Course  Procedures (including critical care time)  COORDINATION OF CARE: 1:24 PM- Discussed treatment plan with patient at bedside and patient agreed to plan.   Labs Review Labs Reviewed  CBC WITH DIFFERENTIAL/PLATELET - Abnormal; Notable for the following:    WBC 17.3 (*)    Neutrophils Relative % 78 (*)    Neutro Abs 13.6 (*)    All other components within normal limits  BASIC METABOLIC PANEL - Abnormal; Notable for the following:    Glucose, Bld 101 (*)  GFR calc non Af Amer 75 (*)    GFR calc Af Amer 87 (*)    All other components within normal limits  D-DIMER, QUANTITATIVE  I-STAT BETA HCG BLOOD, ED ([facility], [facility], [facility] ONLY)  I-STAT TROPOININ, ED  I-STAT CG4 LACTIC ACID, ED    Imaging Review Dg Chest 2 View  10/16/2014   CLINICAL DATA:  Chest pain.  Shortness of breath and cough.  EXAM: CHEST  2 VIEW  COMPARISON:  11/25/2013  FINDINGS: The heart size and mediastinal contours are within normal limits. Both lungs are clear. The visualized skeletal structures are unremarkable.  IMPRESSION: Normal exam.   Electronically Signed   By: Lorriane Shire M.D.   On: 10/16/2014 14:20     EKG Interpretation None      MDM   Final diagnoses:  Chest pain  Leukocytosis  Prolonged Q-T interval on ECG   Filed Vitals:   10/16/14 1259 10/16/14 1744  BP: 162/73 141/88  Pulse: 70 62  Temp:  98 F (36.7 C)  TempSrc: Oral Oral  Resp: 18 19  SpO2: 100% 100%    Medications  morphine 4 MG/ML injection 4 mg (4 mg Intravenous Given 10/16/14 1408)  ondansetron (ZOFRAN) injection 4 mg (4 mg Intravenous Given 10/16/14 1409)    Sandra Miller is a pleasant 39 y.o. female presenting with atraumatic severe right chest pain onset 2 days ago. Chest pain is not reproducible. She has no signs of DVT, d-dimer is ordered and is negative. Chest x-ray is without abnormality. EKG shows a prolonged QT but no other  significant abnormalities. Troponin and lactic acid are normal. She follows at the [facility] internal medicine teaching clinic. I've asked her to follow-up with them to recheck her leukocytosis.  Evaluation does not show pathology that would require ongoing emergent intervention or inpatient treatment. Pt is hemodynamically stable and mentating appropriately. Discussed findings and plan with patient/guardian, who agrees with care plan. All questions answered. Return precautions discussed and outpatient follow up given.   New Prescriptions   HYDROCODONE-ACETAMINOPHEN (NORCO/VICODIN) 5-325 MG PER TABLET    Take 1-2 tablets by mouth every 6 hours as needed for pain.     I personally performed the services described in this documentation, which was scribed in my presence. The recorded information has been reviewed and is accurate.    Monico Blitz, PA-C 10/16/14 1748  Orpah Greek, MD 10/17/14 1517

## 2014-10-16 NOTE — Discharge Instructions (Signed)
Your white blood cell count is high today, you will need to follow up with your primary care doctor to have this rechecked.  Please follow with your primary care doctor in the next 2 days for a check-up. They must obtain records for further management.   Do not hesitate to return to the Emergency Department for any new, worsening or concerning symptoms.   For pain control please take ibuprofen (also known as Motrin or Advil) 800mg  (this is normally 4 over the counter pills) 3 times a day  for 5 days. Take with food to minimize stomach irritation.  Take vicodin for breakthrough pain, do not drink alcohol, drive, care for children or do other critical tasks while taking vicodin.   Chest Pain (Nonspecific) It is often hard to give a specific diagnosis for the cause of chest pain. There is always a chance that your pain could be related to something serious, such as a heart attack or a blood clot in the lungs. You need to follow up with your health care provider for further evaluation. CAUSES   Heartburn.  Pneumonia or bronchitis.  Anxiety or stress.  Inflammation around your heart (pericarditis) or lung (pleuritis or pleurisy).  A blood clot in the lung.  A collapsed lung (pneumothorax). It can develop suddenly on its own (spontaneous pneumothorax) or from trauma to the chest.  Shingles infection (herpes zoster virus). The chest wall is composed of bones, muscles, and cartilage. Any of these can be the source of the pain.  The bones can be bruised by injury.  The muscles or cartilage can be strained by coughing or overwork.  The cartilage can be affected by inflammation and become sore (costochondritis). DIAGNOSIS  Lab tests or other studies may be needed to find the cause of your pain. Your health care provider may have you take a test called an ambulatory electrocardiogram (ECG). An ECG records your heartbeat patterns over a 24-hour period. You may also have other tests, such  as:  Transthoracic echocardiogram (TTE). During echocardiography, sound waves are used to evaluate how blood flows through your heart.  Transesophageal echocardiogram (TEE).  Cardiac monitoring. This allows your health care provider to monitor your heart rate and rhythm in real time.  Holter monitor. This is a portable device that records your heartbeat and can help diagnose heart arrhythmias. It allows your health care provider to track your heart activity for several days, if needed.  Stress tests by exercise or by giving medicine that makes the heart beat faster. TREATMENT   Treatment depends on what may be causing your chest pain. Treatment may include:  Acid blockers for heartburn.  Anti-inflammatory medicine.  Pain medicine for inflammatory conditions.  Antibiotics if an infection is present.  You may be advised to change lifestyle habits. This includes stopping smoking and avoiding alcohol, caffeine, and chocolate.  You may be advised to keep your head raised (elevated) when sleeping. This reduces the chance of acid going backward from your stomach into your esophagus. Most of the time, nonspecific chest pain will improve within 2-3 days with rest and mild pain medicine.  HOME CARE INSTRUCTIONS   If antibiotics were prescribed, take them as directed. Finish them even if you start to feel better.  For the next few days, avoid physical activities that bring on chest pain. Continue physical activities as directed.  Do not use any tobacco products, including cigarettes, chewing tobacco, or electronic cigarettes.  Avoid drinking alcohol.  Only take medicine as directed by  your health care provider.  Follow your health care provider's suggestions for further testing if your chest pain does not go away.  Keep any follow-up appointments you made. If you do not go to an appointment, you could develop lasting (chronic) problems with pain. If there is any problem keeping an  appointment, call to reschedule. SEEK MEDICAL CARE IF:   Your chest pain does not go away, even after treatment.  You have a rash with blisters on your chest.  You have a fever. SEEK IMMEDIATE MEDICAL CARE IF:   You have increased chest pain or pain that spreads to your arm, neck, jaw, back, or abdomen.  You have shortness of breath.  You have an increasing cough, or you cough up blood.  You have severe back or abdominal pain.  You feel nauseous or vomit.  You have severe weakness.  You faint.  You have chills. This is an emergency. Do not wait to see if the pain will go away. Get medical help at once. Call your local emergency services (911 in [place] Do not drive yourself to the hospital. MAKE SURE YOU:   Understand these instructions.  Will watch your condition.  Will get help right away if you are not doing well or get worse. Document Released: 06/01/2005 Document Revised: 08/27/2013 Document Reviewed: 03/27/2008 [facility] Patient Information 2015 [place], [place]. This information is not intended to replace advice given to you by your health care provider. Make sure you discuss any questions you have with your health care provider.

## 2014-10-16 NOTE — ED Notes (Signed)
Per pt, states pain under right breast that radiates to right shoulder that started yesterday-constant discomfort

## 2014-10-20 ENCOUNTER — Ambulatory Visit: Payer: Self-pay | Admitting: Family Medicine

## 2014-10-21 ENCOUNTER — Emergency Department (HOSPITAL_COMMUNITY)
Admission: EM | Admit: 2014-10-21 | Discharge: 2014-10-21 | Discharge status: Against Medical Advice | Payer: Medicare Other | Attending: Emergency Medicine | Admitting: Emergency Medicine

## 2014-10-21 ENCOUNTER — Encounter (HOSPITAL_COMMUNITY): Payer: Self-pay | Admitting: Emergency Medicine

## 2014-10-21 DIAGNOSIS — Z720 Tobacco use: Secondary | ICD-10-CM | POA: Diagnosis not present

## 2014-10-21 DIAGNOSIS — I10 Essential (primary) hypertension: Secondary | ICD-10-CM | POA: Insufficient documentation

## 2014-10-21 NOTE — ED Notes (Signed)
Pt arrived to the ED with a complaint of hypertension.  Pt was at school when she had her blood pressure taken and it was 188/105.  Pt was seen here last Thursday for right breast pain and an abnormal EKG and was told to follow up with her PCP. Pt has chronic left arm pain that has not been resolved.  Pt hypertensive and anxious in triage.

## 2014-10-22 ENCOUNTER — Ambulatory Visit (INDEPENDENT_AMBULATORY_CARE_PROVIDER_SITE_OTHER): Payer: Medicare Other | Admitting: Student

## 2014-10-22 ENCOUNTER — Telehealth: Payer: Self-pay | Admitting: *Deleted

## 2014-10-22 ENCOUNTER — Encounter: Payer: Self-pay | Admitting: Student

## 2014-10-22 ENCOUNTER — Ambulatory Visit: Payer: Self-pay | Admitting: Family Medicine

## 2014-10-22 VITALS — BP 140/85 | HR 88 | Temp 98.4°F | Ht 65.5 in | Wt 184.0 lb

## 2014-10-22 DIAGNOSIS — R079 Chest pain, unspecified: Secondary | ICD-10-CM

## 2014-10-22 DIAGNOSIS — F17200 Nicotine dependence, unspecified, uncomplicated: Secondary | ICD-10-CM

## 2014-10-22 DIAGNOSIS — R0789 Other chest pain: Secondary | ICD-10-CM | POA: Diagnosis not present

## 2014-10-22 DIAGNOSIS — Z720 Tobacco use: Secondary | ICD-10-CM

## 2014-10-22 DIAGNOSIS — M542 Cervicalgia: Secondary | ICD-10-CM

## 2014-10-22 DIAGNOSIS — Z716 Tobacco abuse counseling: Secondary | ICD-10-CM

## 2014-10-22 DIAGNOSIS — D72829 Elevated white blood cell count, unspecified: Secondary | ICD-10-CM

## 2014-10-22 MED ORDER — NICOTINE 21 MG/24HR TD PT24: 21.0000 mg | MEDICATED_PATCH | Freq: Every day | TRANSDERMAL | Status: DC

## 2014-10-22 NOTE — Patient Instructions (Signed)
Please schedule appt with Dr. Valentina Lucks for smoking cessation counseling  Please follow with Cardiology and return to this clinic in 3 months  If you have worsening chest pain, shortness of breath lightheadedness or dizziness, please present to the emergency department

## 2014-10-22 NOTE — Telephone Encounter (Signed)
Left another message for patient to return call. Please tell her that according to [place] she will need to call [phone] and schedule her own appointment. The order is in.[name], Kevin Fenton

## 2014-10-22 NOTE — Telephone Encounter (Signed)
Left message to return call. I need to ask Ms Tuohy the screening questions in order to schedule her for an MRI.Harce Volden, Kevin Fenton

## 2014-10-22 NOTE — Progress Notes (Signed)
   Subjective:    Patient ID: Sandra Miller, female    DOB: 1976/07/09, 39 y.o.   MRN: [id number]  CC: Follow up ED visit for right sided chest pain  HPI:   39 y/o woman with history of bipolar disorder, anxiety who presents as follow up for ED visit on 2/11. She presented for right sided chest and shoulder pain that started suddenly while she was at work. 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 She denies lightheadedness associated with this pain, dizziness or headache. She went to the ED on 2/11 and had a negative cardiac work up with and EKG which was positive for only mild Qtc prolongation. She was discharged to follow up here. She states that her pain continued until last night. She did go to the ED yesterday but when her pain resolved and she was tired to waiting, she left. She additionally reports chronic left arm pain and numbness from the left side of her neck and extended down her arm. She denies weakness associated with this. She had a c-spine MRI ordered on 01/10/2014 which was cancelled due an MRI machine issue but was never rescheduled She additionally reports increasing SOB with exercise over the last several months which she is concerned may be due to her 1 ppd smoking habit  Review of Systems   See HPI for ROS. All other systems reviewed and are negative.  Past medical history, surgical, family, and social history reviewed and updated in the EMR as appropriate. Objective:  BP 143/85 mmHg  Pulse 88  Temp(Src) 98.4 F (36.9 C) (Other (Comment))  Ht 5' 5.5" (1.664 m)  Wt 184 lb (83.462 kg)  BMI 30.14 kg/m2 Vitals and nursing note reviewed  General: NAD Cardiac: RRR, normal heart sounds, no murmurs. 2+ radial and PT pulses bilaterally Respiratory: CTAB, normal effort Extremities: no edema or cyanosis. WWP. Skin: warm and dry, no rashes noted Neuro: alert and  oriented, normal sensation over left arm and hand Musculoskeletal: No tenderness to palpation over the chest or right or left shoulders. No tenderness to palpation of the left neck or arm 5/5 strength to the bilateral upper extremities    Assessment & Plan:  See Problem List Documentation  Chest pain: Given the history and negative cardiac work up suspicion for cardiogenic cause of her chest pain is low. However, as she has multiple risk factors including borderline blood pressures, long history of smoking and strong family history of cardiac disease, referral to cardiology was discussed with the patient. The risks and benefits along with the differential for her chest pain, including potential stress, were discussed and she opted to proceed with cardiology evaluation. Will place referral today  Left arm pain: Her pain does not follow any specific spinal nerve and she has no weakness. Will get MRI to rule out anatomic contributions to her pain.   Smoking: she was counseled on the risks of smoking and cessation was encouraged. She was encouraged to try the patch previously but never picked it up. She expressed willingness to try again. The nicotine patch has been ordered for her.    RTC after MRI and cardiology appointment  Janica Eldred A. Lincoln Brigham MD, [facility] Resident PGY-1 Pager [phone]

## 2014-10-24 DIAGNOSIS — D72829 Elevated white blood cell count, unspecified: Secondary | ICD-10-CM | POA: Insufficient documentation

## 2014-10-24 NOTE — Telephone Encounter (Signed)
Pt called twice and a message was left on the second call. She was called regarding lab finding of Leukocytosis that was noted on 2/11. She did not answer. We would like to repeat her CBC to trend her CBC. Will order a CBC at this time for her to have it at a lab only visit. If it is abnormal she will return to the office for evaluation. She was also advised to schedule her MRI as soon as possible. Will continue to attempt to get in touch with her.   Prachi Oftedahl A. Lincoln Brigham MD, [facility] Resident PGY-1 Pager [phone]

## 2014-10-24 NOTE — Progress Notes (Signed)
I agree with the resident documentation and plan.   Shyah Cadmus MD  

## 2014-11-05 ENCOUNTER — Ambulatory Visit
Admission: RE | Admit: 2014-11-05 | Discharge: 2014-11-05 | Disposition: A | Discharge status: Home / Self Care | Payer: Medicare Other | Source: Ambulatory Visit | Attending: Family Medicine | Admitting: Family Medicine

## 2014-11-05 DIAGNOSIS — M542 Cervicalgia: Secondary | ICD-10-CM

## 2014-11-05 DIAGNOSIS — M4802 Spinal stenosis, cervical region: Secondary | ICD-10-CM | POA: Diagnosis not present

## 2014-11-06 ENCOUNTER — Telehealth: Payer: Self-pay | Admitting: Student

## 2014-11-06 NOTE — Telephone Encounter (Signed)
Pt called regarding her MRI results. She is not answer and a voicemail was left asking her to call the clinic to discuss her results and future steps. Will consider referral to neurology for further management at next visit.  Zarinah Oviatt A. Lincoln Brigham MD, [facility] Resident PGY-1 Pager [phone]

## 2014-11-06 NOTE — Telephone Encounter (Signed)
Sandra Miller called back and we discussed her MRI findings. She will make an appointment for follow up to further discuss referral for her symptomatic neural impingement at the [facility] All questions were answered to the patient's satisfaction  Talor Cheema A. Lincoln Brigham MD, [facility] Resident PGY-1 Pager [phone]

## 2014-11-20 ENCOUNTER — Ambulatory Visit: Payer: Self-pay | Admitting: Cardiovascular Disease

## 2014-11-25 ENCOUNTER — Ambulatory Visit: Payer: Self-pay | Admitting: Student

## 2014-12-03 ENCOUNTER — Encounter: Payer: Self-pay | Admitting: Cardiovascular Disease

## 2014-12-05 ENCOUNTER — Other Ambulatory Visit (HOSPITAL_COMMUNITY)
Admission: RE | Admit: 2014-12-05 | Discharge: 2014-12-05 | Disposition: A | Discharge status: Home / Self Care | Payer: Medicare Other | Source: Ambulatory Visit | Attending: Family Medicine | Admitting: Family Medicine

## 2014-12-05 ENCOUNTER — Ambulatory Visit (INDEPENDENT_AMBULATORY_CARE_PROVIDER_SITE_OTHER): Payer: Medicare Other | Admitting: Family Medicine

## 2014-12-05 ENCOUNTER — Telehealth: Payer: Self-pay | Admitting: Family Medicine

## 2014-12-05 VITALS — BP 114/73 | HR 76 | Temp 98.3°F | Ht 65.5 in | Wt 188.5 lb

## 2014-12-05 DIAGNOSIS — L0293 Carbuncle, unspecified: Secondary | ICD-10-CM

## 2014-12-05 DIAGNOSIS — Z79899 Other long term (current) drug therapy: Secondary | ICD-10-CM

## 2014-12-05 DIAGNOSIS — Z202 Contact with and (suspected) exposure to infections with a predominantly sexual mode of transmission: Secondary | ICD-10-CM

## 2014-12-05 DIAGNOSIS — N898 Other specified noninflammatory disorders of vagina: Secondary | ICD-10-CM | POA: Diagnosis not present

## 2014-12-05 DIAGNOSIS — R350 Frequency of micturition: Secondary | ICD-10-CM

## 2014-12-05 DIAGNOSIS — N912 Amenorrhea, unspecified: Secondary | ICD-10-CM | POA: Diagnosis not present

## 2014-12-05 DIAGNOSIS — R300 Dysuria: Secondary | ICD-10-CM | POA: Diagnosis not present

## 2014-12-05 DIAGNOSIS — Z113 Encounter for screening for infections with a predominantly sexual mode of transmission: Secondary | ICD-10-CM | POA: Insufficient documentation

## 2014-12-05 LAB — POCT URINALYSIS DIPSTICK
Bilirubin, UA: NEGATIVE
Blood, UA: NEGATIVE
GLUCOSE UA: NEGATIVE
Ketones, UA: NEGATIVE
Leukocytes, UA: NEGATIVE
NITRITE UA: NEGATIVE
PROTEIN UA: NEGATIVE
Spec Grav, UA: 1.015
UROBILINOGEN UA: 0.2
pH, UA: 7

## 2014-12-05 LAB — POCT GLYCOSYLATED HEMOGLOBIN (HGB A1C): Hemoglobin A1C: 5.7

## 2014-12-05 LAB — POCT WET PREP (WET MOUNT): Clue Cells Wet Prep Whiff POC: NEGATIVE

## 2014-12-05 LAB — GLUCOSE, CAPILLARY: GLUCOSE-CAPILLARY: 97 mg/dL (ref 70–99)

## 2014-12-05 LAB — POCT URINE PREGNANCY: Preg Test, Ur: NEGATIVE

## 2014-12-05 MED ORDER — NITROFURANTOIN MONOHYD MACRO 100 MG PO CAPS
100.0000 mg | ORAL_CAPSULE | Freq: Two times a day (BID) | ORAL | Status: DC
Start: 2014-12-05 — End: 2015-06-24

## 2014-12-05 MED ORDER — METRONIDAZOLE 0.75 % VA GEL: 1.0000 | Freq: Two times a day (BID) | VAGINAL | Status: DC

## 2014-12-05 NOTE — Patient Instructions (Signed)
It was good to see you.  I think your frequent urination is from a UTI. Your UA was negative but I am sending it for culture and your symptoms sound like UTI. Take antibiotic for 1 week and come back if not improving. Seek immediate care if worsening. Be sure to drink much more water than coke.  I have placed the dermatology referral.  Best,  Hilton Sinclair, MD

## 2014-12-05 NOTE — Telephone Encounter (Signed)
Reviewed this patient's labs prior to her leaving (neg UA, normal CBG, A1c 5.7 (prediabetic), neg UPT, neg wet prep. Urged her to take antibiotics for presumed UTI despite neg UA and that I would send for culture. Discussed that prediabetes may also be reason for frequent urination. It is NOT diabetes but means she is at risk for developing it. Discussed cutting way back on coke intake (from 12 to 1-2 cans daily if she can) and adding walking to her daily routine. I will send her diabetic (and prediabetic) diet guidelines and recommended she follow up with PCP about this in 3 months for repeat A1c and to follow up on dietary changes. She voiced understanding.  Hilton Sinclair, MD

## 2014-12-05 NOTE — Progress Notes (Signed)
Patient ID: [name], female   DOB: [birth year], 39 y.o.   MRN: [id number] Subjective:   CC: Frequent urination  HPI:   Frequent urination Patient presents to same-day clinic with concern for infection. She has had increased urinary frequency x 2 weeks that has been worsening. She denies fevers, chills, abdominal pain, dysuria, pelvic pain, change in oral intake, syncope, back pain, weight loss or change in periods. She has been drinking >12 pack of regular coke daily for quite some time due being a recovering alcohol and substituting this. She does not eat much and has been having some lightheadedness. She also gets frequent BV and has mild increased discharge currently with no vaginal itching. Has only mild spotting and cramping at time of period due to endometrial ablation.  07/28/2014 patient was seen and had wet prep, GC chlamydia, and HIV RPR all negative except for bacterial vaginosis.  Boils She also notes recurrent boils on her buttocks/perineum that have in the past had to be lanced. One current boil right buttock was lanced but has reformed and is painful. She was on 99m abx by dermatologist which did not help. She has been sitting in warm baths daily.  Review of Systems - Per HPI.   FH: Paternal grandparents and aunts with DM, mother with prediabetes    PMH: Atypical chest pain, bipolar disorder, esophageal dysphagia, genital warts, GERD, H. pylori gastritis history, HSV, history of abnormal Pap smear, hypertension, history of nephrolithiasis, recurrent boils, smoker, history of vaginal discharge  Objective:  Physical Exam BP 114/73 mmHg  Pulse 76  Temp(Src) 98.3 F (36.8 C) (Oral)  Ht 5' 5.5" (1.664 m)  Wt 188 lb 8 oz (85.503 kg)  BMI 30.88 kg/m2 GEN: NAD Cardiovascular: Regular rate and rhythm, no murmurs rubs or gallops, 2+ bilateral radial pulses Abdomen: Soft, mildly tender suprapubically, nondistended, no CVA tenderness Extremities: No lower extremity edema GU:  Normal appearing external and internal genitalia, minimal discharge, no CMT, no abnormal size/mobility of uterus and adnexae; 1cm right buttock nodule that is mildly erythematous but nonfluctuant, moderately tender, punctate central pore, no drainage; mons pubis with many slightly raised punctate lesions that are not erythematous or nodular  Assessment:     Sandra Miller is a 39 y.o. female here for frequent urination.    Plan:     # See problem list and after visit summary for problem-specific plans. -PCP had wanted to repeat CBC for leukocytosis. Patient will get this performed today.  # Health Maintenance: Not discussed  Follow-up: Follow up in 2 weeks if no improvement in urinary symptoms.   Sandra Sinclair, MD [place]

## 2014-12-06 DIAGNOSIS — R350 Frequency of micturition: Secondary | ICD-10-CM | POA: Insufficient documentation

## 2014-12-06 LAB — URINE CULTURE: Colony Count: 85000

## 2014-12-06 NOTE — Assessment & Plan Note (Signed)
With recurrent intertriginous boils, consider hidradenitis suppurativa. Trial of 3 mo abx by dermatologist failed but pt never returned and referral expired. -Re-referred to derm and encouraged pt follow up with them. -No fluctuance today; discussed continued regular warm water soaks and reasons to return.

## 2014-12-06 NOTE — Assessment & Plan Note (Signed)
Patient with increased urinary frequency for [redacted] weeks along with suprapubic tenderness. Likely UTI but due to reported intake of 12 cans of Coke or more per day and minimal dysuria, will also check a point-of-care CBG and an A1c. Afebrile, vital stable. -Wet prep, GC chlamydia, UPT; GC/Chlamydia, HIV, RPR all recently negative 07/2014 -Empirically rx macrobid due to symptoms and send urine for culture -Will also empirically represcribe MetroGel as patient is having more discharge -Discussed decreasing coke intake as this is way too much sugar per day and increase water intake and food to avoid dizziness. -Follow-up in one to 2 weeks if no improvement.

## 2014-12-08 ENCOUNTER — Encounter: Payer: Self-pay | Admitting: Family Medicine

## 2014-12-08 LAB — GC/CHLAMYDIA PROBE AMP (~~LOC~~) NOT AT ARMC
CHLAMYDIA, DNA PROBE: NEGATIVE
NEISSERIA GONORRHEA: NEGATIVE

## 2014-12-15 ENCOUNTER — Encounter (HOSPITAL_COMMUNITY): Payer: Self-pay | Admitting: Psychiatry

## 2014-12-15 ENCOUNTER — Ambulatory Visit (INDEPENDENT_AMBULATORY_CARE_PROVIDER_SITE_OTHER): Payer: Medicare Other | Admitting: Psychiatry

## 2014-12-15 VITALS — BP 124/69 | HR 84 | Ht 65.5 in | Wt 187.4 lb

## 2014-12-15 DIAGNOSIS — F319 Bipolar disorder, unspecified: Secondary | ICD-10-CM

## 2014-12-15 MED ORDER — LORAZEPAM 1 MG PO TABS: 1.0000 mg | ORAL_TABLET | Freq: Every evening | ORAL | Status: DC | PRN

## 2014-12-15 MED ORDER — OLANZAPINE 10 MG PO TABS: 10.0000 mg | ORAL_TABLET | Freq: Every day | ORAL | Status: DC

## 2014-12-15 MED ORDER — TOPIRAMATE 25 MG PO TABS: 50.0000 mg | ORAL_TABLET | Freq: Every day | ORAL | Status: DC

## 2014-12-15 MED ORDER — TRAZODONE HCL 100 MG PO TABS: 100.0000 mg | ORAL_TABLET | Freq: Every day | ORAL | Status: DC

## 2014-12-15 MED ORDER — FLUOXETINE HCL 10 MG PO CAPS: 30.0000 mg | ORAL_CAPSULE | Freq: Every day | ORAL | Status: DC

## 2014-12-15 NOTE — Progress Notes (Signed)
[facility] [phone] Progress Note  Sandra Miller [id number] 39 y.o.  12/15/2014 2:56 PM  Chief Complaint  Patient presents with  . Follow-up  . Medication Refill    History of Present Illness: Sandra Miller came for the appointment.  She is compliant with the medication.  Recently she's seen her primary care physician who told her that she has diabetic however her hemoglobin A1c is 5.7.  She's been very anxious nervous since she met her physician.  I reviewed the notes from her primary care physician.  Her hemoglobin A1c is normal.  She has not gained weight.  Reassurance given.  Otherwise she is doing very well.  She denies any irritability, anger, mood swing.  Her sleep is good.  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  She is somewhat concerned about her son who quit the job and now working at airport.  She is happy that her daughter is Writer.  Patient denies drinking or using any illegal substances.  Her appetite is okay.  Her vitals are stable.  She is compliant with Zyprexa , Ativan, Prozac as prescribed.  Suicidal Ideation: No Plan Formed: No Patient has means to carry out plan: No  Homicidal Ideation: No Plan Formed: No Patient has means to carry out plan: No  ROS  Psychiatric: Agitation: No Hallucination: No Depressed Mood: No Insomnia: No Hypersomnia: No Altered Concentration: No Feels Worthless: No Grandiose Ideas: No Belief In Special Powers: No New/Increased Substance Abuse: No Compulsions: No  Neurologic: Headache: Yes Seizure: No Paresthesias: No  Medical history. Patient has history of hypertension , migraine headache and GERD.    Outpatient Encounter Prescriptions as of 12/15/2014  Medication Sig  . docusate sodium (COLACE) 100 MG capsule Take 1 capsule (100 mg total) by mouth 2 (two) times daily.  . fluconazole (DIFLUCAN) 150 MG tablet Take 1 tablet (150 mg total) by mouth once.  Marland Kitchen FLUoxetine (PROZAC) 10 MG capsule Take 3  capsules (30 mg total) by mouth daily. TAKE 3 CAPSULES DAILY.  Marland Kitchen HYDROcodone-acetaminophen (NORCO/VICODIN) 5-325 MG per tablet Take 1-2 tablets by mouth every 6 hours as needed for pain.  Marland Kitchen imiquimod (ALDARA) 5 % cream Apply topically 3 (three) times a week.  Marland Kitchen LORazepam (ATIVAN) 1 MG tablet Take 1 tablet (1 mg total) by mouth at bedtime as needed for anxiety.  . metroNIDAZOLE (METROGEL) 0.75 % vaginal gel Place 1 Applicatorful vaginally 2 (two) times daily. for 5 days  . Multiple Vitamin (MULTIVITAMIN) tablet Take 1 tablet by mouth daily.  . nicotine (NICODERM CQ - DOSED IN MG/24 HOURS) 21 mg/24hr patch Place 1 patch (21 mg total) onto the skin daily.  . nitrofurantoin, macrocrystal-monohydrate, (MACROBID) 100 MG capsule Take 1 capsule (100 mg total) by mouth 2 (two) times daily.  Marland Kitchen OLANZapine (ZYPREXA) 10 MG tablet Take 1 tablet (10 mg total) by mouth at bedtime.  Marland Kitchen omeprazole (PRILOSEC) 40 MG capsule take 1 capsule by mouth twice a day  . polyethylene glycol powder (GLYCOLAX/MIRALAX) powder Take 17 g by mouth 2 (two) times daily as needed. (Patient taking differently: Take 17 g by mouth 2 (two) times daily as needed for mild constipation. )  . topiramate (TOPAMAX) 25 MG tablet Take 2 tablets (50 mg total) by mouth at bedtime.  . traMADol (ULTRAM) 50 MG tablet Take 1 tablet (50 mg total) by mouth every 8 (eight) hours as needed.  . traZODone (DESYREL) 100 MG tablet Take 1 tablet (100 mg total) by  mouth at bedtime.  . valACYclovir (VALTREX) 500 MG tablet Take 1 tablet (500 mg total) by mouth daily.  . [DISCONTINUED] acetaminophen (TYLENOL) 325 MG tablet Take 2 tablets (650 mg total) by mouth every 6 (six) hours as needed.  . [DISCONTINUED] acetaminophen (TYLENOL) 500 MG tablet Take 1,000 mg by mouth every 6 (six) hours as needed for moderate pain or headache.  . [DISCONTINUED] FLUoxetine (PROZAC) 10 MG capsule TAKE 3 CAPSULES DAILY. (Patient taking differently: Take 30 mg by mouth daily. TAKE 3  CAPSULES DAILY.)  . [DISCONTINUED] LORazepam (ATIVAN) 1 MG tablet Take 1 tablet (1 mg total) by mouth at bedtime. (Patient taking differently: Take 1 mg by mouth at bedtime as needed for anxiety. )  . [DISCONTINUED] OLANZapine (ZYPREXA) 10 MG tablet Take 1 tablet (10 mg total) by mouth at bedtime.  . [DISCONTINUED] topiramate (TOPAMAX) 25 MG tablet Take 2 tablets (50 mg total) by mouth at bedtime.  . [DISCONTINUED] traZODone (DESYREL) 100 MG tablet Take 1 tablet (100 mg total) by mouth at bedtime.    Past Psychiatric History/Hospitalization(s): Patient has history of bipolar disorder, alcohol dependence and marijuana abuse.  She has seen in this office on and off since 2008.  She is at [facility] psychiatric hospitalization.  She's been admitted in behavioral [place] in October 2008.  In the past she had tried lithium, Seroquel, Lamictal, Paxil, Celexa, Depakote, Neurontin and Cymbalta with limited response.   Anxiety: Yes Bipolar Disorder: Yes Depression: Yes Mania: Yes Psychosis: Yes Schizophrenia: No Personality Disorder: No Hospitalization for psychiatric illness: Yes History of Electroconvulsive Shock Therapy: No Prior Suicide Attempts: No  Physical Exam: Constitutional:  BP 124/69 mmHg  Pulse 84  Ht 5' 5.5" (1.664 m)  Wt 187 lb 6.4 oz (85.004 kg)  BMI 30.70 kg/m2  Recent Results (from the past 2160 hour(s))  CBC with Differential     Status: Abnormal   Collection Time: 10/16/14  1:41 PM  Result Value Ref Range   WBC 17.3 (H) 4.0 - 10.5 K/uL   RBC 4.62 3.87 - 5.11 MIL/uL   Hemoglobin 13.5 12.0 - 15.0 g/dL   HCT 40.9 36.0 - 46.0 %   MCV 88.5 78.0 - 100.0 fL   MCH 29.2 26.0 - 34.0 pg   MCHC 33.0 30.0 - 36.0 g/dL   RDW 14.5 11.5 - 15.5 %   Platelets 373 150 - 400 K/uL   Neutrophils Relative % 78 (H) 43 - 77 %   Neutro Abs 13.6 (H) 1.7 - 7.7 K/uL   Lymphocytes Relative 16 12 - 46 %   Lymphs Abs 2.7 0.7 - 4.0 K/uL   Monocytes Relative 6 3 - 12 %   Monocytes Absolute  1.0 0.1 - 1.0 K/uL   Eosinophils Relative 0 0 - 5 %   Eosinophils Absolute 0.1 0.0 - 0.7 K/uL   Basophils Relative 0 0 - 1 %   Basophils Absolute 0.0 0.0 - 0.1 K/uL  Basic metabolic panel     Status: Abnormal   Collection Time: 10/16/14  1:41 PM  Result Value Ref Range   Sodium 138 135 - 145 mmol/L   Potassium 3.5 3.5 - 5.1 mmol/L   Chloride 108 96 - 112 mmol/L   CO2 24 19 - 32 mmol/L   Glucose, Bld 101 (H) 70 - 99 mg/dL   BUN 12 6 - 23 mg/dL   Creatinine, Ser 0.94 0.50 - 1.10 mg/dL   Calcium 9.6 8.4 - 10.5 mg/dL   GFR calc non  Af Amer 75 (L) >90 mL/min   GFR calc Af Amer 87 (L) >90 mL/min    Comment: (NOTE) The eGFR has been calculated using the CKD EPI equation. This calculation has not been validated in all clinical situations. eGFR's persistently <90 mL/min signify possible Chronic Kidney Disease.    Anion gap 6 5 - 15  D-dimer, quantitative     Status: None   Collection Time: 10/16/14  1:41 PM  Result Value Ref Range   D-Dimer, Quant 0.47 0.00 - 0.48 ug/mL-FEU    Comment:        AT THE INHOUSE ESTABLISHED CUTOFF VALUE OF 0.48 ug/mL FEU, THIS ASSAY HAS BEEN DOCUMENTED IN THE LITERATURE TO HAVE A SENSITIVITY AND NEGATIVE PREDICTIVE VALUE OF AT LEAST 98 TO 99%.  THE TEST RESULT SHOULD BE CORRELATED WITH AN ASSESSMENT OF THE CLINICAL PROBABILITY OF DVT / VTE.   I-Stat Beta hCG blood, ED ([facility], [facility], [facility] only)     Status: None   Collection Time: 10/16/14  1:49 PM  Result Value Ref Range   I-stat hCG, quantitative <5.0 <5 mIU/mL   Comment 3            Comment:   GEST. AGE      CONC.  (mIU/mL)   <=1 WEEK        5 - 50     2 WEEKS       50 - 500     3 WEEKS       100 - 10,000     4 WEEKS     1,000 - 30,000        FEMALE AND NON-PREGNANT FEMALE:     LESS THAN 5 mIU/mL   I-Stat CG4 Lactic Acid, ED     Status: None   Collection Time: 10/16/14  3:55 PM  Result Value Ref Range   Lactic Acid, Venous 1.41 0.5 - 2.0 mmol/L  I-stat troponin, ED     Status: None   Collection  Time: 10/16/14  5:27 PM  Result Value Ref Range   Troponin i, poc 0.00 0.00 - 0.08 ng/mL   Comment 3            Comment: Due to the release kinetics of cTnI, a negative result within the first hours of the onset of symptoms does not rule out myocardial infarction with certainty. If myocardial infarction is still suspected, repeat the test at appropriate intervals.   GC/Chlamydia probe amp ([place])     Status: None   Collection Time: 12/05/14 12:00 AM  Result Value Ref Range   Chlamydia Negative     Comment: Normal Reference Range - Negative   Neisseria gonorrhea Negative     Comment: Normal Reference Range - Negative  POCT Wet Prep [facility])     Status: None   Collection Time: 12/05/14  1:42 PM  Result Value Ref Range   Source Wet Prep POC VAG    WBC, Wet Prep HPF POC 1-3    Bacteria Wet Prep HPF POC 3+ RODS    Clue Cells Wet Prep HPF POC None    Clue Cells Wet Prep Whiff POC Negative Whiff    Yeast Wet Prep HPF POC None    Trichomonas Wet Prep HPF POC NONE   POCT urinalysis dipstick     Status: None   Collection Time: 12/05/14  1:42 PM  Result Value Ref Range   Color, UA YELLOW    Clarity, UA CLEAR  Glucose, UA NEG    Bilirubin, UA NEG    Ketones, UA NEG    Spec Grav, UA 1.015    Blood, UA NEG    pH, UA 7.0    Protein, UA NEG    Urobilinogen, UA 0.2    Nitrite, UA NEG    Leukocytes, UA Negative   POCT urine pregnancy     Status: None   Collection Time: 12/05/14  2:30 PM  Result Value Ref Range   Preg Test, Ur Negative   HgB A1c     Status: None   Collection Time: 12/05/14  2:34 PM  Result Value Ref Range   Hemoglobin A1C 5.7   Glucose, capillary     Status: None   Collection Time: 12/05/14  2:40 PM  Result Value Ref Range   Glucose-Capillary 97 70 - 99 mg/dL  Urine culture     Status: None   Collection Time: 12/05/14  3:46 PM  Result Value Ref Range   Colony Count 85,000 COLONIES/ML    Organism ID, Bacteria Multiple bacterial morphotypes present,  none    Organism ID, Bacteria predominant. Suggest appropriate recollection if     Organism ID, Bacteria clinically indicated.    General Appearance: well nourished,   Musculoskeletal: Strength & Muscle Tone: within normal limits Gait & Station: normal Patient leans: N/A  Mental status examination Patient is casually dressed and fairly groomed.  She is  anxious but cooperative. Her speech is spontaneous, clear and coherent  She describes her mood is tired and affect is appropriate.  She has no tremors or shakes.  She denies any auditory or visual hallucination.  She has no paranoia or any obsessive thoughts.  She denies any active or passive suicidal thoughts or homicidal thoughts.  Her fund of knowledge is adequate.  Her memory is intact.  Her psychomotor activity is normal.  She has no tremors or shakes.  She is alert and oriented x3.  Her insight judgment and impulse control is okay.  Established Problem, Stable/Improving (1), Review of Psycho-Social Stressors (1), Review of Last Therapy Session (1) and Review of Medication Regimen & Side Effects (2)  Assessment: Axis I: Bipolar disorder, NOS  Axis II: Deferred  Axis III:  Patient Active Problem List   Diagnosis Date Noted  . Frequent urination 12/06/2014  . Leukocytosis 10/24/2014  . Chest pain 10/22/2014  . Neck pain on left side 10/22/2014  . Smoker 10/22/2014  . Palpitations 07/25/2014  . Genital warts 05/20/2014  . Epidermal inclusion cyst 04/29/2014  . Paresthesia of left upper limb 12/26/2013  . Atypical chest pain 04/22/2013  . Vaginal discharge 03/13/2013  . Left knee pain 01/09/2013  . GERD (gastroesophageal reflux disease) 12/26/2012  . Stomach burning 12/26/2012  . Right wrist pain 11/16/2012  . Recurrent boils 09/14/2012  . Tobacco abuse 08/22/2012  . Esophageal dysphagia 10/28/2011  . HELICOBACTER PYLORI GASTRITIS, HX OF 03/20/2009  . HYPERTENSION NEC 03/19/2009  . NEPHROLITHIASIS, HX OF 03/19/2009  .  HIATAL HERNIA WITH REFLUX 03/16/2009  . HSV 02/03/2007  . DISORDER, BIPOLAR NOS 02/03/2007  . HX, PERSONAL, GENITAL/OBSTETRIC DISORDER NEC 02/03/2007    Plan:   I reviewed records and her blood results.  Her hemoglobin A1c is 5.7 which is normal.  Reassurance given.  Patient is fairly stable on her current medication.  I will continue Ativan 1 mg at bedtime  as needed for anxiety and insomnia, she does not asked for early refills.,  Continue Zyprexa 10 mg at  bedtime, Topamax 50 mg at bedtime and trazodone 100 mg at bedtime.  Recommended to call us back if she has any question, concern or if she feels worsening of the symptoms.  Discussed medication side effects and benefits. I will see her again in 3 months.  Paula Busenbark T., MD 12/15/2014

## 2014-12-26 ENCOUNTER — Telehealth (HOSPITAL_COMMUNITY): Payer: Self-pay | Admitting: *Deleted

## 2014-12-26 NOTE — Telephone Encounter (Signed)
Patient's appointment for 03-16-15 needed to be rescheduled---provider out.  LMOM for patient to call back.

## 2014-12-29 ENCOUNTER — Telehealth (HOSPITAL_COMMUNITY): Payer: Self-pay | Admitting: *Deleted

## 2014-12-29 NOTE — Telephone Encounter (Signed)
2nd attempt.  LMOM for call back to r/s appointment

## 2015-01-30 ENCOUNTER — Ambulatory Visit (INDEPENDENT_AMBULATORY_CARE_PROVIDER_SITE_OTHER): Payer: Medicare Other | Admitting: Student

## 2015-01-30 ENCOUNTER — Encounter: Payer: Self-pay | Admitting: Student

## 2015-01-30 ENCOUNTER — Other Ambulatory Visit (HOSPITAL_COMMUNITY)
Admission: RE | Admit: 2015-01-30 | Discharge: 2015-01-30 | Disposition: A | Discharge status: Home / Self Care | Payer: Medicare Other | Source: Ambulatory Visit | Attending: Family Medicine | Admitting: Family Medicine

## 2015-01-30 VITALS — BP 135/81 | HR 91 | Temp 99.3°F | Ht 66.0 in | Wt 186.0 lb

## 2015-01-30 DIAGNOSIS — B373 Candidiasis of vulva and vagina: Secondary | ICD-10-CM | POA: Diagnosis not present

## 2015-01-30 DIAGNOSIS — R202 Paresthesia of skin: Secondary | ICD-10-CM | POA: Diagnosis not present

## 2015-01-30 DIAGNOSIS — Z01419 Encounter for gynecological examination (general) (routine) without abnormal findings: Secondary | ICD-10-CM | POA: Diagnosis not present

## 2015-01-30 DIAGNOSIS — Z113 Encounter for screening for infections with a predominantly sexual mode of transmission: Secondary | ICD-10-CM | POA: Insufficient documentation

## 2015-01-30 DIAGNOSIS — Z124 Encounter for screening for malignant neoplasm of cervix: Secondary | ICD-10-CM

## 2015-01-30 DIAGNOSIS — Z1151 Encounter for screening for human papillomavirus (HPV): Secondary | ICD-10-CM | POA: Insufficient documentation

## 2015-01-30 DIAGNOSIS — B3731 Acute candidiasis of vulva and vagina: Secondary | ICD-10-CM | POA: Insufficient documentation

## 2015-01-30 DIAGNOSIS — G542 Cervical root disorders, not elsewhere classified: Secondary | ICD-10-CM | POA: Diagnosis not present

## 2015-01-30 DIAGNOSIS — A499 Bacterial infection, unspecified: Secondary | ICD-10-CM

## 2015-01-30 DIAGNOSIS — N898 Other specified noninflammatory disorders of vagina: Secondary | ICD-10-CM

## 2015-01-30 DIAGNOSIS — R739 Hyperglycemia, unspecified: Secondary | ICD-10-CM | POA: Diagnosis not present

## 2015-01-30 DIAGNOSIS — B9689 Other specified bacterial agents as the cause of diseases classified elsewhere: Secondary | ICD-10-CM

## 2015-01-30 DIAGNOSIS — N760 Acute vaginitis: Secondary | ICD-10-CM | POA: Diagnosis not present

## 2015-01-30 LAB — POCT WET PREP (WET MOUNT): Clue Cells Wet Prep Whiff POC: NEGATIVE

## 2015-01-30 LAB — GLUCOSE, CAPILLARY: Glucose-Capillary: 101 mg/dL — ABNORMAL HIGH (ref 65–99)

## 2015-01-30 MED ORDER — FLUCONAZOLE 150 MG PO TABS: 150.0000 mg | ORAL_TABLET | Freq: Every day | ORAL | Status: DC

## 2015-01-30 MED ORDER — METRONIDAZOLE 0.75 % VA GEL: 1.0000 | Freq: Two times a day (BID) | VAGINAL | Status: DC

## 2015-01-30 MED ORDER — NYSTATIN POWD: 1.0000 "application " | Freq: Two times a day (BID) | Status: DC

## 2015-01-30 NOTE — Assessment & Plan Note (Addendum)
Exam and symtoms c/x yeast infection  -Will treat with diflucan 150 q72hr x3 -Nystatin powder for leg infection  - discussed hygiene and importance of keeping her vaginal area dry - wet prep collected today, will follow

## 2015-01-30 NOTE — Patient Instructions (Signed)
Follow up in 3 months Take diflucan every 72 hours for three doses Use nystatin power twice daily on legs until rash improves Keep legs and vaginal area clean and dry Follow up with neurology for left arm symptoms

## 2015-01-30 NOTE — Assessment & Plan Note (Signed)
BV occurs for her after menses and has previously been treated with standing metrogel to be used for 5 days following menses for ppx  - will give metrogel Rx today with 12 refills - wet prep collected today, will follow and inform pt of + results and advise as appropriate

## 2015-01-30 NOTE — Progress Notes (Signed)
Subjective:    Patient ID: Sandra Miller, female    DOB: 10-23-1975, 39 y.o.   MRN: [id number]   CC: F/u MRI findings, vaginal itching  HPI  39 y/o with PMH sig for left arm parasthesia s/p MRI cervical spine which revealed neural impingement at the C5-6 and C6-7 foramina  Parasthesia - Continued left arm numbness and tingling. Occasional shooting pain with certain positions. No weakness. Had MRI on 11/06/2014 and desires to discuss the results  Vaginal itching - reports vaginal itching with white clumpy discharge, denies foul odor. This has been an ongoing issue for her and now reports it has worsend to include itching of legs as well. Has tried monostat but with no relief. Previously treated with diflucan x1 which did not help  BV:  Reports long standing history of BV infections after her menses previously treated with metrogel by her OB. Had two applicators left from last Rx and used it to treat BV symptoms after last menses. Feels these treated her most recent symptoms   Review of Systems   See HPI for ROS.  iate.  Past Medical History  Diagnosis Date  . Anxiety   . Hypertension     history of  . GERD (gastroesophageal reflux disease)   . Hiatal hernia   . HSV   . DISORDER, BIPOLAR NOS   . ALCOHOLISM     history of  . DISORDER, TOBACCO USE   . SUBSTANCE ABUSE     history of  . CONSTIPATION   . Abdominal pain, unspecified site   . EPIGASTRIC PAIN   . HELICOBACTER PYLORI GASTRITIS, HX OF   . NEPHROLITHIASIS, HX OF   . HX, PERSONAL, GENITAL/OBSTETRIC DISORDER NEC   . Esophageal dysphagia     History   Social History  . Marital Status: Single    Spouse Name: N/A  . Number of Children: N/A  . Years of Education: N/A   Occupational History  . Not on file.   Social History Main Topics  . Smoking status: Current Every Day Smoker -- 1.00 packs/day for 22 years    Types: Cigarettes  . Smokeless tobacco: Never Used     Comment: form given 10-27-11/patches are  not covered under insurance  . Alcohol Use: No     Comment: sober for 9 months  . Drug Use: No     Comment: did use marijuana, no longer doing this  . Sexual Activity: Yes    Birth Control/ Protection: None   Other Topics Concern  . Not on file   Social History Narrative   Former [facility] patient.      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    In school at [employer] to become Psychologist, sport and exercise.     Disabled for her bipolar d/o      History of EtOH abuse and THC use.    Objective:  BP 135/81 mmHg  Pulse 91  Temp(Src) 99.3 F (37.4 C) (Oral)  Ht 5\' 6"  (1.676 m)  Wt 186 lb (84.369 kg)  BMI 30.04 kg/m2 Vitals and nursing note reviewed  General: NAD Cardiac: RRR, normal heart sounds, no murmurs. Respiratory: CTAB, normal effort Abdomen: soft, nontender, nondistended,  Skin: warm and dry, no rashes noted Neuro: Cranial Nerves II - XII - II - Visual field intact  III, IV, VI - Extraocular movements intact. V - Facial sensation intact bilaterally. VII - Facial movement intact bilaterally. VIII - Hearing & vestibular intact bilaterally.  X - Palate elevates symmetrically, no dysarthria. XI - Chin turning & shoulder shrug intact bilaterally. XII - Tongue protrusion intact.  Motor Strength - The patient's strength was 5/5 in all extremities Bulk was normal and fasciculations were absent.   Sensory - touch sensatation symmetrical but, while she can feel touch to left side her arm feels numb +Spurlings  Coordination - The patient had normal movements in the hands with no ataxia or dysmetria.   Gait and Station - normal gait.  Pelvic; nl EGBUS, white clumpy discharge in vaginal canal else normal vagina and cervix, no CMT, normal adnexa, with no adnexal masses or tenderness, mobile uterus   Assessment & Plan:  See Problem List      Carisma Troupe A. Lincoln Brigham MD, [facility] Resident PGY-1 Pager [phone]

## 2015-01-30 NOTE — Assessment & Plan Note (Signed)
MRI in 11/2014 significant for C5-7 stenosis with left nerve impingement.  + Spurling test c/w history and imaging findings  - Pt tried neurontin previously and is thought it was too sedating. She is not interested in trying it again - Will refer to neurology for further therapy

## 2015-01-30 NOTE — Progress Notes (Signed)
One of the assigned preceptor. 

## 2015-02-03 LAB — CERVICOVAGINAL ANCILLARY ONLY
Chlamydia: NEGATIVE
Neisseria Gonorrhea: NEGATIVE

## 2015-02-03 LAB — CYTOLOGY - PAP

## 2015-02-26 ENCOUNTER — Ambulatory Visit: Payer: Medicare Other | Admitting: Neurology

## 2015-02-26 ENCOUNTER — Emergency Department (HOSPITAL_COMMUNITY)
Admission: EM | Admit: 2015-02-26 | Discharge: 2015-02-26 | Disposition: A | Discharge status: Home / Self Care | Payer: Medicare Other | Attending: Emergency Medicine | Admitting: Emergency Medicine

## 2015-02-26 ENCOUNTER — Encounter (HOSPITAL_COMMUNITY): Payer: Self-pay | Admitting: Emergency Medicine

## 2015-02-26 DIAGNOSIS — I10 Essential (primary) hypertension: Secondary | ICD-10-CM | POA: Diagnosis not present

## 2015-02-26 DIAGNOSIS — K219 Gastro-esophageal reflux disease without esophagitis: Secondary | ICD-10-CM | POA: Diagnosis not present

## 2015-02-26 DIAGNOSIS — Z87442 Personal history of urinary calculi: Secondary | ICD-10-CM | POA: Insufficient documentation

## 2015-02-26 DIAGNOSIS — Z720 Tobacco use: Secondary | ICD-10-CM | POA: Diagnosis not present

## 2015-02-26 DIAGNOSIS — R109 Unspecified abdominal pain: Secondary | ICD-10-CM | POA: Insufficient documentation

## 2015-02-26 DIAGNOSIS — R197 Diarrhea, unspecified: Secondary | ICD-10-CM | POA: Diagnosis not present

## 2015-02-26 DIAGNOSIS — F419 Anxiety disorder, unspecified: Secondary | ICD-10-CM | POA: Diagnosis not present

## 2015-02-26 DIAGNOSIS — Z792 Long term (current) use of antibiotics: Secondary | ICD-10-CM | POA: Insufficient documentation

## 2015-02-26 DIAGNOSIS — Z8619 Personal history of other infectious and parasitic diseases: Secondary | ICD-10-CM | POA: Diagnosis not present

## 2015-02-26 DIAGNOSIS — Z3202 Encounter for pregnancy test, result negative: Secondary | ICD-10-CM | POA: Diagnosis not present

## 2015-02-26 DIAGNOSIS — F319 Bipolar disorder, unspecified: Secondary | ICD-10-CM | POA: Diagnosis not present

## 2015-02-26 DIAGNOSIS — R112 Nausea with vomiting, unspecified: Secondary | ICD-10-CM | POA: Insufficient documentation

## 2015-02-26 DIAGNOSIS — Z79899 Other long term (current) drug therapy: Secondary | ICD-10-CM | POA: Diagnosis not present

## 2015-02-26 DIAGNOSIS — Z8742 Personal history of other diseases of the female genital tract: Secondary | ICD-10-CM | POA: Insufficient documentation

## 2015-02-26 LAB — CBC WITH DIFFERENTIAL/PLATELET
BASOS ABS: 0 10*3/uL (ref 0.0–0.1)
BASOS PCT: 0 % (ref 0–1)
EOS PCT: 0 % (ref 0–5)
Eosinophils Absolute: 0 10*3/uL (ref 0.0–0.7)
HEMATOCRIT: 48.4 % — AB (ref 36.0–46.0)
Hemoglobin: 16.2 g/dL — ABNORMAL HIGH (ref 12.0–15.0)
LYMPHS PCT: 7 % — AB (ref 12–46)
Lymphs Abs: 1 10*3/uL (ref 0.7–4.0)
MCH: 29.2 pg (ref 26.0–34.0)
MCHC: 33.5 g/dL (ref 30.0–36.0)
MCV: 87.4 fL (ref 78.0–100.0)
MONO ABS: 0.7 10*3/uL (ref 0.1–1.0)
Monocytes Relative: 5 % (ref 3–12)
NEUTROS ABS: 13.5 10*3/uL — AB (ref 1.7–7.7)
NEUTROS PCT: 88 % — AB (ref 43–77)
Platelets: 391 10*3/uL (ref 150–400)
RBC: 5.54 MIL/uL — ABNORMAL HIGH (ref 3.87–5.11)
RDW: 14.1 % (ref 11.5–15.5)
WBC: 15.3 10*3/uL — AB (ref 4.0–10.5)

## 2015-02-26 LAB — HEPATIC FUNCTION PANEL
ALT: 12 U/L — AB (ref 14–54)
AST: 21 U/L (ref 15–41)
Albumin: 5.1 g/dL — ABNORMAL HIGH (ref 3.5–5.0)
Alkaline Phosphatase: 69 U/L (ref 38–126)
Bilirubin, Direct: <0.1 mg/dL — ABNORMAL LOW (ref 0.1–0.5)
TOTAL PROTEIN: 9.7 g/dL — AB (ref 6.5–8.1)
Total Bilirubin: 0.9 mg/dL (ref 0.3–1.2)

## 2015-02-26 LAB — I-STAT CHEM 8, ED
BUN: 15 mg/dL (ref 6–20)
Calcium, Ion: 1.18 mmol/L (ref 1.12–1.23)
Chloride: 107 mmol/L (ref 101–111)
Creatinine, Ser: 1 mg/dL (ref 0.44–1.00)
GLUCOSE: 136 mg/dL — AB (ref 65–99)
HEMATOCRIT: 55 % — AB (ref 36.0–46.0)
HEMOGLOBIN: 18.7 g/dL — AB (ref 12.0–15.0)
POTASSIUM: 3.3 mmol/L — AB (ref 3.5–5.1)
SODIUM: 142 mmol/L (ref 135–145)
TCO2: 18 mmol/L (ref 0–100)

## 2015-02-26 LAB — LIPASE, BLOOD: LIPASE: 18 U/L — AB (ref 22–51)

## 2015-02-26 LAB — HCG, QUANTITATIVE, PREGNANCY: hCG, Beta Chain, Quant, S: <1 m[IU]/mL (ref <5)

## 2015-02-26 MED ORDER — SODIUM CHLORIDE 0.9 % IV BOLUS (SEPSIS)
1000.0000 mL | Freq: Once | INTRAVENOUS | Status: AC
Start: 2015-02-26 — End: 2015-02-26
  Administered 2015-02-26: 1000 mL via INTRAVENOUS

## 2015-02-26 MED ORDER — PROMETHAZINE HCL 25 MG/ML IJ SOLN
25.0000 mg | Freq: Once | INTRAMUSCULAR | Status: AC
  Administered 2015-02-26: 25 mg via INTRAVENOUS
  Filled 2015-02-26: qty 1

## 2015-02-26 MED ORDER — PROMETHAZINE HCL 25 MG PO TABS: 25.0000 mg | ORAL_TABLET | Freq: Four times a day (QID) | ORAL | Status: DC | PRN

## 2015-02-26 MED ORDER — MORPHINE SULFATE 4 MG/ML IJ SOLN: 4.0000 mg | Freq: Once | INTRAMUSCULAR | Status: DC

## 2015-02-26 NOTE — ED Notes (Signed)
Called for pt several times, no response

## 2015-02-26 NOTE — ED Notes (Signed)
Bed: DD22 Expected date:  Expected time:  Means of arrival:  Comments: EMS-N/V

## 2015-02-26 NOTE — ED Notes (Signed)
IV removed by JL

## 2015-02-26 NOTE — Discharge Instructions (Signed)

## 2015-02-26 NOTE — ED Provider Notes (Signed)
CSN: [id number]     Arrival date & time 02/26/15  1307 History   First MD Initiated Contact with Patient 02/26/15 1416     Chief Complaint  Patient presents with  . Nausea  . Emesis  . Diarrhea     (Consider location/radiation/quality/duration/timing/severity/associated sxs/prior Treatment) HPI   39 year old female with history of alcohol abuse, GERD, substance abuse, constipation, recurrent abdominal pain brought here via EMS from home for evaluation of abdominal pain. Patient developed nausea vomiting and diarrhea since 4 AM this morning. He woke up with feeling of nausea and has vomited multiple times. Vomitus is nonbloody nonbilious. She also has multiple bouts of nonbloody non-mucousy diarrhea. She endorse of crampy periumbilical abdominal pain mild to moderate. Symptoms worsening with vomiting. She endorses chills. She denies fever, headache, chest pain, shortness of breath, back pain, dysuria, hematuria. She reports quit drinking alcohol for the past 3 years. She denies eating any exotic food of any recent sick contact. She has endometrial ablation and having irregular menstrual period. Patient mentioned that she has been staying very busy with both school and work. She feels exhausted. Patient reports she hasn't been eating appropriately, her diet consist of drinking Coca-Cola, and eating chips.  Pt also report having tubal ligation in the past.  She's a G2P2  Past Medical History  Diagnosis Date  . Anxiety   . Hypertension     history of  . GERD (gastroesophageal reflux disease)   . Hiatal hernia   . HSV   . DISORDER, BIPOLAR NOS   . ALCOHOLISM     history of  . DISORDER, TOBACCO USE   . SUBSTANCE ABUSE     history of  . CONSTIPATION   . Abdominal pain, unspecified site   . EPIGASTRIC PAIN   . HELICOBACTER PYLORI GASTRITIS, HX OF   . NEPHROLITHIASIS, HX OF   . HX, PERSONAL, GENITAL/OBSTETRIC DISORDER NEC   . Esophageal dysphagia    Past Surgical History  Procedure  Laterality Date  . Cesarean section  1995  . Knee arthroscopy      left  . Tubal ligation  1995  . [facility]   . Esophagogastroduodenoscopy    . Colonoscopy     Family History  Problem Relation Age of Onset  . Stomach cancer Paternal Grandfather   . Diabetes Maternal Grandfather   . Diabetes Paternal Grandmother   . Heart disease Father   . Lung cancer Father   . Heart disease Mother   . Depression Mother   . Colon polyps Brother   . Kidney disease Maternal Uncle   . Cirrhosis Cousin     alcoholic  . Anxiety disorder Maternal Aunt   . Depression Maternal Aunt    History  Substance Use Topics  . Smoking status: Current Every Day Smoker -- 1.00 packs/day for 22 years    Types: Cigarettes  . Smokeless tobacco: Never Used     Comment: form given 10-27-11/patches are not covered under insurance  . Alcohol Use: No     Comment: sober for 9 months   OB History    No data available     Review of Systems  All other systems reviewed and are negative.     Allergies  Aspirin and Minocycline  Home Medications   Prior to Admission medications   Medication Sig Start Date End Date Taking? Authorizing Provider  docusate sodium (COLACE) 100 MG capsule Take 1 capsule (100 mg total) by  mouth 2 (two) times daily. 08/20/14   Bernadene Bell, MD  fluconazole (DIFLUCAN) 150 MG tablet Take 1 tablet (150 mg total) by mouth once. 07/29/14   Patrecia Pour, MD  fluconazole (DIFLUCAN) 150 MG tablet Take 1 tablet (150 mg total) by mouth daily. 01/30/15   Veatrice Bourbon, MD  FLUoxetine (PROZAC) 10 MG capsule Take 3 capsules (30 mg total) by mouth daily. TAKE 3 CAPSULES DAILY. 12/15/14   Kathlee Nations, MD  HYDROcodone-acetaminophen (NORCO/VICODIN) 5-325 MG per tablet Take 1-2 tablets by mouth every 6 hours as needed for pain. 10/16/14   Nicole Pisciotta, PA-C  imiquimod (ALDARA) 5 % cream Apply topically 3 (three) times a week. 05/20/14   Virginia Crews, MD   LORazepam (ATIVAN) 1 MG tablet Take 1 tablet (1 mg total) by mouth at bedtime as needed for anxiety. 12/15/14   Kathlee Nations, MD  metroNIDAZOLE (METROGEL) 0.75 % vaginal gel Place 1 Applicatorful vaginally 2 (two) times daily. for 5 days 01/30/15   Veatrice Bourbon, MD  Multiple Vitamin (MULTIVITAMIN) tablet Take 1 tablet by mouth daily.    Historical Provider, MD  nicotine (NICODERM CQ - DOSED IN MG/24 HOURS) 21 mg/24hr patch Place 1 patch (21 mg total) onto the skin daily. 10/22/14   Veatrice Bourbon, MD  nitrofurantoin, macrocrystal-monohydrate, (MACROBID) 100 MG capsule Take 1 capsule (100 mg total) by mouth 2 (two) times daily. 12/05/14   Hilton Sinclair, MD  Nystatin POWD 1 application by Does not apply route 2 (two) times daily. 01/30/15   Alyssa A Haney, MD  OLANZapine (ZYPREXA) 10 MG tablet Take 1 tablet (10 mg total) by mouth at bedtime. 12/15/14 12/15/15  Kathlee Nations, MD  omeprazole (PRILOSEC) 40 MG capsule take 1 capsule by mouth twice a day 07/08/14   Gatha Mayer, MD  polyethylene glycol powder (GLYCOLAX/MIRALAX) powder Take 17 g by mouth 2 (two) times daily as needed. Patient taking differently: Take 17 g by mouth 2 (two) times daily as needed for mild constipation.  08/20/14   Bernadene Bell, MD  topiramate (TOPAMAX) 25 MG tablet Take 2 tablets (50 mg total) by mouth at bedtime. 12/15/14   Kathlee Nations, MD  traMADol (ULTRAM) 50 MG tablet Take 1 tablet (50 mg total) by mouth every 8 (eight) hours as needed. 08/20/14   Bernadene Bell, MD  traZODone (DESYREL) 100 MG tablet Take 1 tablet (100 mg total) by mouth at bedtime. 12/15/14   Kathlee Nations, MD  valACYclovir (VALTREX) 500 MG tablet Take 1 tablet (500 mg total) by mouth daily. 10/09/13   Leeanne Rio, MD   There were no vitals taken for this visit. Physical Exam  Constitutional: She appears well-developed and well-nourished. No distress.  HENT:  Head: Atraumatic.  Eyes: Conjunctivae are normal.  Neck: Neck supple.   Cardiovascular: Normal rate and regular rhythm.   Pulmonary/Chest: Effort normal and breath sounds normal.  Abdominal: Soft. Bowel sounds are normal. She exhibits no distension. There is no tenderness.  Negative Murphy sign, no pain McBurney's point, no peritoneal sign.  Neurological: She is alert.  Skin: No rash noted.  Psychiatric: She has a normal mood and affect.  Nursing note and vitals reviewed.   ED Course  Procedures (including critical care time)  Patient here with persistent nausea vomiting diarrhea and periumbilical abdominal pain. Patient has benign abdomen on exam. Suspect viral GI at this time.  3:19 PM Evidence of leukocytosis with a WBC  of 15.3. Blood is hemoconcentrated, suspect dehydration. No significant abdominal discomfort. At this time have low suspicion for appendicitis, colitis, or other acute emergent medical condition. I did discuss options of CT scan, patient agrees advanced imaging not indicated at this time. We'll continue with IV hydration. Pain medication offered, patient declined. Patient is able to tolerates by mouth. Patient made aware to return if condition worsens as this could be early appendicitis.  Patient is aware of leukocytosis, she also remembers having leukocytosis from the previous visit. She agrees to follow-up with her PCP for recheck.  Labs Review Labs Reviewed  CBC WITH DIFFERENTIAL/PLATELET - Abnormal; Notable for the following:    WBC 15.3 (*)    RBC 5.54 (*)    Hemoglobin 16.2 (*)    HCT 48.4 (*)    Neutrophils Relative % 88 (*)    Neutro Abs 13.5 (*)    Lymphocytes Relative 7 (*)    All other components within normal limits  LIPASE, BLOOD - Abnormal; Notable for the following:    Lipase 18 (*)    All other components within normal limits  HEPATIC FUNCTION PANEL - Abnormal; Notable for the following:    Total Protein 9.7 (*)    Albumin 5.1 (*)    ALT 12 (*)    Bilirubin, Direct <0.1 (*)    All other components within normal  limits  I-STAT CHEM 8, ED - Abnormal; Notable for the following:    Potassium 3.3 (*)    Glucose, Bld 136 (*)    Hemoglobin 18.7 (*)    HCT 55.0 (*)    All other components within normal limits  URINALYSIS, ROUTINE W REFLEX MICROSCOPIC (NOT AT [facility])  HCG, QUANTITATIVE, PREGNANCY  POC URINE PREG, ED    Imaging Review No results found.   EKG Interpretation None      MDM   Final diagnoses:  Nausea vomiting and diarrhea    BP 138/65 mmHg  Pulse 62  Temp(Src) 97.9 F (36.6 C) (Axillary)  Resp 18  SpO2 100%     Domenic Moras, PA-C 02/26/15 Hamer, PA-C 02/26/15 1558  Debby Freiberg, MD 02/27/15 1030

## 2015-02-26 NOTE — ED Notes (Signed)
MADE 3 SEPRATE CALLS FOR THIS PT WITH NO ANSWER

## 2015-02-26 NOTE — ED Notes (Addendum)
Pt aware urine specimen is needed, cannot use restroom at this time 

## 2015-02-26 NOTE — ED Notes (Signed)
Patient brought in by EMS with complaints of abd pain, n/v/d that started around 4am. Generalized pain and weakness.

## 2015-02-26 NOTE — ED Notes (Signed)
Called to notify lab of add-on hCG

## 2015-03-16 ENCOUNTER — Encounter: Payer: Self-pay | Admitting: Gastroenterology

## 2015-03-16 ENCOUNTER — Ambulatory Visit (HOSPITAL_COMMUNITY): Payer: Self-pay | Admitting: Psychiatry

## 2015-03-23 ENCOUNTER — Encounter: Payer: Self-pay | Admitting: Family Medicine

## 2015-03-23 ENCOUNTER — Ambulatory Visit (INDEPENDENT_AMBULATORY_CARE_PROVIDER_SITE_OTHER): Payer: Medicare Other | Admitting: Family Medicine

## 2015-03-23 VITALS — BP 118/76 | HR 88 | Temp 98.6°F | Ht 65.5 in | Wt 180.9 lb

## 2015-03-23 DIAGNOSIS — J029 Acute pharyngitis, unspecified: Secondary | ICD-10-CM

## 2015-03-23 DIAGNOSIS — B9789 Other viral agents as the cause of diseases classified elsewhere: Secondary | ICD-10-CM

## 2015-03-23 DIAGNOSIS — J028 Acute pharyngitis due to other specified organisms: Principal | ICD-10-CM

## 2015-03-23 LAB — POCT RAPID STREP A (OFFICE): RAPID STREP A SCREEN: NEGATIVE

## 2015-03-23 NOTE — Progress Notes (Signed)
Patient ID: Sandra Miller, female   DOB: 05-Jan-1976, 39 y.o.   MRN: [id number]   [facility] Aquilla Hacker, MD Phone: [phone]  Subjective:   SORE THROAT  Sore throat began 3 days ago. Pain is: moderate Severity: moderate Medications tried: Tylenol, Afrin Strep throat exposure: None STD exposure: None  Symptoms Fever: None Cough: None Runny nose: Yes  Muscle aches: None Swollen Glands: None Trouble breathing: None Drooling: None Weight loss: None  Patient believes could be caused by: She is unsure, but thinks it may be related to her congestion.   Review of Symptoms - see HPI PMH - Smoking status noted.    Additionally, she had a viral illness with nausea and vomiting two weeks ago. She has a granddaughter who has been sick with a viral illness. Does not specifically feel any postnasal drip. Her pain is more on the right side. She denies swelling, odynophagia, or difficulty opening her mouth.   All relevant systems were reviewed and were negative unless otherwise noted in the HPI  Past Medical History Reviewed problem list.  Medications- reviewed and updated Current Outpatient Prescriptions  Medication Sig Dispense Refill  . docusate sodium (COLACE) 100 MG capsule Take 1 capsule (100 mg total) by mouth 2 (two) times daily. (Patient not taking: Reported on 02/26/2015) 10 capsule 0  . fluconazole (DIFLUCAN) 150 MG tablet Take 1 tablet (150 mg total) by mouth once. (Patient not taking: Reported on 02/26/2015) 1 tablet 0  . fluconazole (DIFLUCAN) 150 MG tablet Take 1 tablet (150 mg total) by mouth daily. (Patient not taking: Reported on 02/26/2015) 3 tablet 2  . FLUoxetine (PROZAC) 10 MG capsule Take 3 capsules (30 mg total) by mouth daily. TAKE 3 CAPSULES DAILY. 90 capsule 2  . HYDROcodone-acetaminophen (NORCO/VICODIN) 5-325 MG per tablet Take 1-2 tablets by mouth every 6 hours as needed for pain. (Patient not taking: Reported on 02/26/2015) 15  tablet 0  . imiquimod (ALDARA) 5 % cream Apply topically 3 (three) times a week. (Patient not taking: Reported on 02/26/2015) 12 each 0  . LORazepam (ATIVAN) 1 MG tablet Take 1 tablet (1 mg total) by mouth at bedtime as needed for anxiety. 30 tablet 1  . metroNIDAZOLE (METROGEL) 0.75 % vaginal gel Place 1 Applicatorful vaginally 2 (two) times daily. for 5 days (Patient not taking: Reported on 02/26/2015) 70 g 12  . Multiple Vitamin (MULTIVITAMIN) tablet Take 1 tablet by mouth daily.    . nicotine (NICODERM CQ - DOSED IN MG/24 HOURS) 21 mg/24hr patch Place 1 patch (21 mg total) onto the skin daily. (Patient not taking: Reported on 02/26/2015) 28 patch 0  . nitrofurantoin, macrocrystal-monohydrate, (MACROBID) 100 MG capsule Take 1 capsule (100 mg total) by mouth 2 (two) times daily. (Patient not taking: Reported on 02/26/2015) 14 capsule 0  . Nystatin POWD 1 application by Does not apply route 2 (two) times daily. 1 Bottle 0  . OLANZapine (ZYPREXA) 10 MG tablet Take 1 tablet (10 mg total) by mouth at bedtime. 30 tablet 2  . omeprazole (PRILOSEC) 40 MG capsule take 1 capsule by mouth twice a day 60 capsule 11  . polyethylene glycol powder (GLYCOLAX/MIRALAX) powder Take 17 g by mouth 2 (two) times daily as needed. (Patient not taking: Reported on 02/26/2015) 3350 g 1  . promethazine (PHENERGAN) 25 MG tablet Take 1 tablet (25 mg total) by mouth every 6 (six) hours as needed for nausea. 20 tablet 0  . topiramate (TOPAMAX) 25 MG tablet  Take 2 tablets (50 mg total) by mouth at bedtime. 60 tablet 2  . traMADol (ULTRAM) 50 MG tablet Take 1 tablet (50 mg total) by mouth every 8 (eight) hours as needed. (Patient not taking: Reported on 02/26/2015) 30 tablet 0  . traZODone (DESYREL) 100 MG tablet Take 1 tablet (100 mg total) by mouth at bedtime. 30 tablet 2  . valACYclovir (VALTREX) 500 MG tablet Take 1 tablet (500 mg total) by mouth daily. (Patient taking differently: Take 500 mg by mouth daily as needed (break outs).  ) 30 tablet 2   No current facility-administered medications for this visit.   Chief complaint-noted No additions to family history Social history- patient is a current 1ppd smoker  Objective: BP 118/76 mmHg  Pulse 88  Temp(Src) 98.6 F (37 C) (Oral)  Ht 5' 5.5" (1.664 m)  Wt 180 lb 14.4 oz (82.056 kg)  BMI 29.63 kg/m2 Gen: NAD, alert, cooperative with exam HEENT: NCAT, EOMI, PERRL, TMs with small amount of fluid behind the membrane bilaterally. No TTP over sinuses. Nares patent with a small amount of dried mucus. O/P clear with very mild erythema, symmetric bilaterally without tonisilar or adenoid enlargement. Dentition with slight caries but otherwise no evidence of dental infection. No Lymphadenopathy.  Neck: FROM, supple, thyroid normal in size.  CV: RRR, good S1/S2, no murmur Resp: CTABL, no wheezes, non-labored Abd: SNTND, BS present, no guarding or organomegaly Ext: No edema, warm, normal tone, moves UE/LE spontaneously Neuro: Alert and oriented, No gross deficits Skin: no rashes no lesions  Assessment/Plan:  # Throat Pain - Most likely a viral URI with some congestion and postnasal drip given bilateral effusions behind TM's. She does not have any unilateral swelling or evidence of abscess formation. Very mild erythema. Could be viral pharyngitis, but pain is not to this degree. No swollen nodes. 1/5 Centor criteria and rapid strep negative.  - Likely viral infection.  - Supportive care with decongestant and NSAIDs for pain.  - Drink plenty of water.  - Return precautions given including fever, swelling, worsening throat pain, or difficutly swallowing / breathing.  - F/U with PCP as needed.

## 2015-03-23 NOTE — Patient Instructions (Signed)
Pharyngitis Pharyngitis is redness, pain, and swelling (inflammation) of your pharynx.  CAUSES  Pharyngitis is usually caused by infection. Most of the time, these infections are from viruses (viral) and are part of a cold. However, sometimes pharyngitis is caused by bacteria (bacterial). Pharyngitis can also be caused by allergies. Viral pharyngitis may be spread from person to person by coughing, sneezing, and personal items or utensils (cups, forks, spoons, toothbrushes). Bacterial pharyngitis may be spread from person to person by more intimate contact, such as kissing.  SIGNS AND SYMPTOMS  Symptoms of pharyngitis include:   Sore throat.   Tiredness (fatigue).   Low-grade fever.   Headache.  Joint pain and muscle aches.  Skin rashes.  Swollen lymph nodes.  Plaque-like film on throat or tonsils (often seen with bacterial pharyngitis). DIAGNOSIS  Your health care provider will ask you questions about your illness and your symptoms. Your medical history, along with a physical exam, is often all that is needed to diagnose pharyngitis. Sometimes, a rapid strep test is done. Other lab tests may also be done, depending on the suspected cause.  TREATMENT  Viral pharyngitis will usually get better in 3-4 days without the use of medicine. Bacterial pharyngitis is treated with medicines that kill germs (antibiotics).  HOME CARE INSTRUCTIONS   Drink enough water and fluids to keep your urine clear or pale yellow.   Only take over-the-counter or prescription medicines as directed by your health care provider:   If you are prescribed antibiotics, make sure you finish them even if you start to feel better.   Do not take aspirin.   Get lots of rest.   Gargle with 8 oz of salt water ( tsp of salt per 1 qt of water) as often as every 1-2 hours to soothe your throat.   Throat lozenges (if you are not at risk for choking) or sprays may be used to soothe your throat. SEEK MEDICAL  CARE IF:   You have large, tender lumps in your neck.  You have a rash.  You cough up green, yellow-brown, or bloody spit. SEEK IMMEDIATE MEDICAL CARE IF:   Your neck becomes stiff.  You drool or are unable to swallow liquids.  You vomit or are unable to keep medicines or liquids down.  You have severe pain that does not go away with the use of recommended medicines.  You have trouble breathing (not caused by a stuffy nose). MAKE SURE YOU:   Understand these instructions.  Will watch your condition.  Will get help right away if you are not doing well or get worse. Document Released: 08/22/2005 Document Revised: 06/12/2013 Document Reviewed: 04/29/2013 [facility] Patient Information 2015 [place], [place]. This information is not intended to replace advice given to you by your health care provider. Make sure you discuss any questions you have with your health care provider.   Thanks for letting us take care of you!  You probably have a virus resulting in some postnasal drip symptoms.   You can take a decongestant, and Naprosyn or tylenol for pain / inflammation.   If you develop a fever, neck stiffness, neck swelling, or difficulty closing or opening your mouth, then please seek immediate medical care.   Take care!  Sincerely,  Paula Compton, MD Family Medicine - PGY 2

## 2015-03-24 ENCOUNTER — Ambulatory Visit (INDEPENDENT_AMBULATORY_CARE_PROVIDER_SITE_OTHER): Payer: Medicare Other | Admitting: Psychiatry

## 2015-03-24 ENCOUNTER — Encounter (HOSPITAL_COMMUNITY): Payer: Self-pay | Admitting: Psychiatry

## 2015-03-24 VITALS — BP 120/78 | HR 75 | Ht 65.5 in | Wt 183.2 lb

## 2015-03-24 DIAGNOSIS — F319 Bipolar disorder, unspecified: Secondary | ICD-10-CM

## 2015-03-24 MED ORDER — LORAZEPAM 1 MG PO TABS: 1.0000 mg | ORAL_TABLET | Freq: Every evening | ORAL | Status: DC | PRN

## 2015-03-24 MED ORDER — FLUOXETINE HCL 10 MG PO CAPS
30.0000 mg | ORAL_CAPSULE | Freq: Every day | ORAL | Status: DC
Start: 2015-03-24 — End: 2015-06-24

## 2015-03-24 MED ORDER — TOPIRAMATE 25 MG PO TABS
50.0000 mg | ORAL_TABLET | Freq: Every day | ORAL | Status: DC
Start: 2015-03-24 — End: 2015-06-24

## 2015-03-24 MED ORDER — OLANZAPINE 10 MG PO TABS: 10.0000 mg | ORAL_TABLET | Freq: Every day | ORAL | Status: DC

## 2015-03-24 MED ORDER — TRAZODONE HCL 100 MG PO TABS: 100.0000 mg | ORAL_TABLET | Freq: Every day | ORAL | Status: DC

## 2015-03-24 NOTE — Progress Notes (Signed)
[facility] [phone] Progress Note  Sandra Miller [id number] 39 y.o.  03/24/2015 4:10 PM  Chief Complaint  Patient presents with  . Follow-up  . Medication Refill    History of Present Illness: Ady came for the appointment.  She is frustrated because her son moved in with her because she lost his job.  Though she loved her children but she is not happy because she is financially stressed.  She recently graduated however she again enroll herself for the courses of billing and coding.  Patient told that she cannot sit home without doing anything.  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  Her sleep is good.  Recently she has lot of health issues and see has seen emergency room a few times because of chronic GI issues.  Her WBC count is increased.  She is not sure if she has infection because she is seeing multiple times physician.  She is concerned about her physical health.  However she like her psychomotor with medication.  She is taking Ativan, Zyprexa, Topamax, Prozac and reported no side effects.  Her appetite is okay.  Her sleep is good.  She denies any agitation, anger, severe mood swing.  She denies any paranoia or any hallucination.  Her vitals are stable.  Patient denies drinking or using any illegal substances.  Suicidal Ideation: No Plan Formed: No Patient has means to carry out plan: No  Homicidal Ideation: No Plan Formed: No Patient has means to carry out plan: No  ROS  Psychiatric: Agitation: No Hallucination: No Depressed Mood: No Insomnia: No Hypersomnia: No Altered Concentration: No Feels Worthless: No Grandiose Ideas: No Belief In Special Powers: No New/Increased Substance Abuse: No Compulsions: No  Neurologic: Headache: Yes Seizure: No Paresthesias: No  Medical history. Patient has history of hypertension , migraine headache and GERD.    Outpatient Encounter Prescriptions as of 03/24/2015  Medication Sig  . docusate  sodium (COLACE) 100 MG capsule Take 1 capsule (100 mg total) by mouth 2 (two) times daily. (Patient not taking: Reported on 02/26/2015)  . fluconazole (DIFLUCAN) 150 MG tablet Take 1 tablet (150 mg total) by mouth once. (Patient not taking: Reported on 02/26/2015)  . fluconazole (DIFLUCAN) 150 MG tablet Take 1 tablet (150 mg total) by mouth daily. (Patient not taking: Reported on 02/26/2015)  . FLUoxetine (PROZAC) 10 MG capsule Take 3 capsules (30 mg total) by mouth daily. TAKE 3 CAPSULES DAILY.  Marland Kitchen imiquimod (ALDARA) 5 % cream Apply topically 3 (three) times a week. (Patient not taking: Reported on 02/26/2015)  . LORazepam (ATIVAN) 1 MG tablet Take 1 tablet (1 mg total) by mouth at bedtime as needed for anxiety.  . metroNIDAZOLE (METROGEL) 0.75 % vaginal gel Place 1 Applicatorful vaginally 2 (two) times daily. for 5 days (Patient not taking: Reported on 02/26/2015)  . Multiple Vitamin (MULTIVITAMIN) tablet Take 1 tablet by mouth daily.  . nitrofurantoin, macrocrystal-monohydrate, (MACROBID) 100 MG capsule Take 1 capsule (100 mg total) by mouth 2 (two) times daily. (Patient not taking: Reported on 02/26/2015)  . Nystatin POWD 1 application by Does not apply route 2 (two) times daily.  Marland Kitchen OLANZapine (ZYPREXA) 10 MG tablet Take 1 tablet (10 mg total) by mouth at bedtime.  Marland Kitchen omeprazole (PRILOSEC) 40 MG capsule take 1 capsule by mouth twice a day  . polyethylene glycol powder (GLYCOLAX/MIRALAX) powder Take 17 g by mouth 2 (two) times daily as needed. (Patient not taking: Reported on 02/26/2015)  .  promethazine (PHENERGAN) 25 MG tablet Take 1 tablet (25 mg total) by mouth every 6 (six) hours as needed for nausea.  Marland Kitchen topiramate (TOPAMAX) 25 MG tablet Take 2 tablets (50 mg total) by mouth at bedtime.  . traZODone (DESYREL) 100 MG tablet Take 1 tablet (100 mg total) by mouth at bedtime.  . valACYclovir (VALTREX) 500 MG tablet Take 1 tablet (500 mg total) by mouth daily. (Patient taking differently: Take 500 mg by  mouth daily as needed (break outs). )  . [DISCONTINUED] FLUoxetine (PROZAC) 10 MG capsule Take 3 capsules (30 mg total) by mouth daily. TAKE 3 CAPSULES DAILY.  . [DISCONTINUED] HYDROcodone-acetaminophen (NORCO/VICODIN) 5-325 MG per tablet Take 1-2 tablets by mouth every 6 hours as needed for pain. (Patient not taking: Reported on 02/26/2015)  . [DISCONTINUED] LORazepam (ATIVAN) 1 MG tablet Take 1 tablet (1 mg total) by mouth at bedtime as needed for anxiety.  . [DISCONTINUED] nicotine (NICODERM CQ - DOSED IN MG/24 HOURS) 21 mg/24hr patch Place 1 patch (21 mg total) onto the skin daily. (Patient not taking: Reported on 02/26/2015)  . [DISCONTINUED] OLANZapine (ZYPREXA) 10 MG tablet Take 1 tablet (10 mg total) by mouth at bedtime.  . [DISCONTINUED] topiramate (TOPAMAX) 25 MG tablet Take 2 tablets (50 mg total) by mouth at bedtime.  . [DISCONTINUED] traMADol (ULTRAM) 50 MG tablet Take 1 tablet (50 mg total) by mouth every 8 (eight) hours as needed. (Patient not taking: Reported on 02/26/2015)  . [DISCONTINUED] traZODone (DESYREL) 100 MG tablet Take 1 tablet (100 mg total) by mouth at bedtime.   No facility-administered encounter medications on file as of 03/24/2015.    Past Psychiatric History/Hospitalization(s): Patient has history of bipolar disorder, alcohol dependence and marijuana abuse.  She has seen in this office on and off since 2008.  She is at lease 6 psychiatric hospitalization.  She's been admitted in behavioral [place] in October 2008.  In the past she had tried lithium, Seroquel, Lamictal, Paxil, Celexa, Depakote, Neurontin and Cymbalta with limited response.   Anxiety: Yes Bipolar Disorder: Yes Depression: Yes Mania: Yes Psychosis: Yes Schizophrenia: No Personality Disorder: No Hospitalization for psychiatric illness: Yes History of Electroconvulsive Shock Therapy: No Prior Suicide Attempts: No  Physical Exam: Constitutional:  BP 120/78 mmHg  Pulse 75  Ht 5' 5.5" (1.664  m)  Wt 183 lb 3.2 oz (83.099 kg)  BMI 30.01 kg/m2  Recent Results (from the past 2160 hour(s))  Cytology - PAP [place]     Status: None   Collection Time: 01/30/15 12:00 AM  Result Value Ref Range   CYTOLOGY - PAP PAP RESULT   Cervicovaginal ancillary only     Status: None   Collection Time: 01/30/15 12:00 AM  Result Value Ref Range   Chlamydia Negative     Comment: Normal Reference Range - Negative   Neisseria gonorrhea Negative     Comment: Normal Reference Range - Negative  POCT Wet Prep [place])     Status: Abnormal   Collection Time: 01/30/15  2:18 PM  Result Value Ref Range   Source Wet Prep POC VAG    WBC, Wet Prep HPF POC 1-5    Bacteria Wet Prep HPF POC 3+ RODS    Clue Cells Wet Prep HPF POC None    Clue Cells Wet Prep Whiff POC Negative Whiff    Yeast Wet Prep HPF POC Moderate    Trichomonas Wet Prep HPF POC NONE   Glucose, capillary     Status: Abnormal  Collection Time: 01/30/15  2:22 PM  Result Value Ref Range   Glucose-Capillary 101 (H) 65 - 99 mg/dL  CBC with Differential/Platelet     Status: Abnormal   Collection Time: 02/26/15  2:40 PM  Result Value Ref Range   WBC 15.3 (H) 4.0 - 10.5 K/uL   RBC 5.54 (H) 3.87 - 5.11 MIL/uL   Hemoglobin 16.2 (H) 12.0 - 15.0 g/dL   HCT 48.4 (H) 36.0 - 46.0 %   MCV 87.4 78.0 - 100.0 fL   MCH 29.2 26.0 - 34.0 pg   MCHC 33.5 30.0 - 36.0 g/dL   RDW 14.1 11.5 - 15.5 %   Platelets 391 150 - 400 K/uL   Neutrophils Relative % 88 (H) 43 - 77 %   Neutro Abs 13.5 (H) 1.7 - 7.7 K/uL   Lymphocytes Relative 7 (L) 12 - 46 %   Lymphs Abs 1.0 0.7 - 4.0 K/uL   Monocytes Relative 5 3 - 12 %   Monocytes Absolute 0.7 0.1 - 1.0 K/uL   Eosinophils Relative 0 0 - 5 %   Eosinophils Absolute 0.0 0.0 - 0.7 K/uL   Basophils Relative 0 0 - 1 %   Basophils Absolute 0.0 0.0 - 0.1 K/uL  Lipase, blood     Status: Abnormal   Collection Time: 02/26/15  2:40 PM  Result Value Ref Range   Lipase 18 (L) 22 - 51 U/L  Hepatic function panel      Status: Abnormal   Collection Time: 02/26/15  2:40 PM  Result Value Ref Range   Total Protein 9.7 (H) 6.5 - 8.1 g/dL   Albumin 5.1 (H) 3.5 - 5.0 g/dL   AST 21 15 - 41 U/L   ALT 12 (L) 14 - 54 U/L   Alkaline Phosphatase 69 38 - 126 U/L   Total Bilirubin 0.9 0.3 - 1.2 mg/dL   Bilirubin, Direct <0.1 (L) 0.1 - 0.5 mg/dL   Indirect Bilirubin NOT CALCULATED 0.3 - 0.9 mg/dL  hCG, quantitative, pregnancy     Status: None   Collection Time: 02/26/15  2:40 PM  Result Value Ref Range   hCG, Beta Chain, Quant, S <1 <5 mIU/mL    Comment:          GEST. AGE      CONC.  (mIU/mL)   <=1 WEEK        5 - 50     2 WEEKS       50 - 500     3 WEEKS       100 - 10,000     4 WEEKS     1,000 - 30,000     5 WEEKS     3,500 - 115,000   6-8 WEEKS     12,000 - 270,000    12 WEEKS     15,000 - 220,000        FEMALE AND NON-PREGNANT FEMALE:     LESS THAN 5 mIU/mL   I-stat chem 8, ed     Status: Abnormal   Collection Time: 02/26/15  2:44 PM  Result Value Ref Range   Sodium 142 135 - 145 mmol/L   Potassium 3.3 (L) 3.5 - 5.1 mmol/L   Chloride 107 101 - 111 mmol/L   BUN 15 6 - 20 mg/dL   Creatinine, Ser 1.00 0.44 - 1.00 mg/dL   Glucose, Bld 136 (H) 65 - 99 mg/dL   Calcium, Ion 1.18 1.12 - 1.23 mmol/L   TCO2 18  0 - 100 mmol/L   Hemoglobin 18.7 (H) 12.0 - 15.0 g/dL   HCT 55.0 (H) 36.0 - 46.0 %  POCT rapid strep A     Status: None   Collection Time: 03/23/15  1:45 PM  Result Value Ref Range   Rapid Strep A Screen Negative Negative   General Appearance: well nourished,   Musculoskeletal: Strength & Muscle Tone: within normal limits Gait & Station: normal Patient leans: N/A  Mental status examination Patient is casually dressed and fairly groomed.  She is  anxious but cooperative. Her speech is spontaneous, clear and coherent  She describes her mood euthymic and her affect is appropriate.  She has no tremors or shakes.  She denies any auditory or visual hallucination.  She has no paranoia or any  obsessive thoughts.  She denies any active or passive suicidal thoughts or homicidal thoughts.  Her fund of knowledge is adequate.  Her memory is intact.  Her psychomotor activity is normal.  She has no tremors or shakes.  She is alert and oriented x3.  Her insight judgment and impulse control is okay.  Established Problem, Stable/Improving (1), Review of Psycho-Social Stressors (1), Review of Last Therapy Session (1) and Review of Medication Regimen & Side Effects (2)  Assessment: Axis I: Bipolar disorder, NOS  Axis II: Deferred  Axis III:  Patient Active Problem List   Diagnosis Date Noted  . Yeast infection of the vagina 01/30/2015  . BV (bacterial vaginosis) 01/30/2015  . Frequent urination 12/06/2014  . Leukocytosis 10/24/2014  . Chest pain 10/22/2014  . Neck pain on left side 10/22/2014  . Smoker 10/22/2014  . Palpitations 07/25/2014  . Genital warts 05/20/2014  . Epidermal inclusion cyst 04/29/2014  . Paresthesia of left upper limb 12/26/2013  . Atypical chest pain 04/22/2013  . Left knee pain 01/09/2013  . GERD (gastroesophageal reflux disease) 12/26/2012  . Stomach burning 12/26/2012  . Right wrist pain 11/16/2012  . Recurrent boils 09/14/2012  . Tobacco abuse 08/22/2012  . Esophageal dysphagia 10/28/2011  . HELICOBACTER PYLORI GASTRITIS, HX OF 03/20/2009  . HYPERTENSION NEC 03/19/2009  . NEPHROLITHIASIS, HX OF 03/19/2009  . HIATAL HERNIA WITH REFLUX 03/16/2009  . HSV 02/03/2007  . DISORDER, BIPOLAR NOS 02/03/2007  . HX, PERSONAL, GENITAL/OBSTETRIC DISORDER NEC 02/03/2007    Plan:  Reassurance given.  Patient like to continue her current psycho topic medication.  She has no side effects.  She liked to keep herself busy.  I will continue Ativan 1 mg at bedtime as needed for anxiety and insomnia, she does not asked for early refills.,  Continue Zyprexa 10 mg at bedtime, Topamax 50 mg at bedtime and trazodone 100 mg at bedtime.  Recommended to call us back if she has  any question, concern or if she feels worsening of the symptoms.  Discussed medication side effects and benefits. I will see her again in 3 months.  Kaysey Berndt T., MD 03/24/2015

## 2015-04-24 ENCOUNTER — Other Ambulatory Visit: Payer: Self-pay | Admitting: Family Medicine

## 2015-04-24 NOTE — Telephone Encounter (Signed)
Need refill on valacyclovir.  Patient have an appt on 9/13 at 1:45.  Need medication filled until then.

## 2015-04-26 MED ORDER — VALACYCLOVIR HCL 500 MG PO TABS: 500.0000 mg | ORAL_TABLET | Freq: Every day | ORAL | Status: DC | PRN

## 2015-04-27 ENCOUNTER — Ambulatory Visit (INDEPENDENT_AMBULATORY_CARE_PROVIDER_SITE_OTHER): Payer: Medicare Other | Admitting: *Deleted

## 2015-04-27 DIAGNOSIS — Z111 Encounter for screening for respiratory tuberculosis: Secondary | ICD-10-CM

## 2015-04-27 NOTE — Progress Notes (Signed)
   PPD placed Left Forearm.  Pt to return 04/29/15 for reading.  Pt tolerated intradermal injection. Derl Barrow, RN

## 2015-04-29 ENCOUNTER — Ambulatory Visit (INDEPENDENT_AMBULATORY_CARE_PROVIDER_SITE_OTHER): Payer: Medicare Other | Admitting: *Deleted

## 2015-04-29 ENCOUNTER — Encounter: Payer: Self-pay | Admitting: *Deleted

## 2015-04-29 DIAGNOSIS — Z111 Encounter for screening for respiratory tuberculosis: Secondary | ICD-10-CM

## 2015-04-29 DIAGNOSIS — Z7689 Persons encountering health services in other specified circumstances: Secondary | ICD-10-CM

## 2015-04-29 LAB — TB SKIN TEST
Induration: 0 mm
TB SKIN TEST: NEGATIVE

## 2015-04-29 NOTE — Progress Notes (Signed)
   PPD Reading Note PPD read and results entered in [employer]. Result: 0 mm induration. Interpretation: Negative If test not read within 48-72 hours of initial placement, patient advised to repeat in other arm 1-3 weeks after this test. Allergic reaction: no  Martin, Tamika L, RN  

## 2015-05-19 ENCOUNTER — Ambulatory Visit: Payer: Self-pay | Admitting: Family Medicine

## 2015-05-29 DIAGNOSIS — Z23 Encounter for immunization: Secondary | ICD-10-CM | POA: Diagnosis not present

## 2015-06-01 ENCOUNTER — Ambulatory Visit: Payer: Self-pay | Admitting: Family Medicine

## 2015-06-24 ENCOUNTER — Ambulatory Visit (INDEPENDENT_AMBULATORY_CARE_PROVIDER_SITE_OTHER): Payer: Medicare Other | Admitting: Psychiatry

## 2015-06-24 ENCOUNTER — Encounter (HOSPITAL_COMMUNITY): Payer: Self-pay | Admitting: Psychiatry

## 2015-06-24 VITALS — BP 136/84 | HR 92 | Ht 65.5 in | Wt 184.0 lb

## 2015-06-24 DIAGNOSIS — F319 Bipolar disorder, unspecified: Secondary | ICD-10-CM | POA: Diagnosis not present

## 2015-06-24 MED ORDER — TRAZODONE HCL 100 MG PO TABS: 100.0000 mg | ORAL_TABLET | Freq: Every day | ORAL | Status: DC

## 2015-06-24 MED ORDER — FLUOXETINE HCL 40 MG PO CAPS: 40.0000 mg | ORAL_CAPSULE | Freq: Every day | ORAL | Status: DC

## 2015-06-24 MED ORDER — OLANZAPINE 10 MG PO TABS: 10.0000 mg | ORAL_TABLET | Freq: Every day | ORAL | Status: DC

## 2015-06-24 MED ORDER — TOPIRAMATE 25 MG PO TABS: 50.0000 mg | ORAL_TABLET | Freq: Every day | ORAL | Status: DC

## 2015-06-24 NOTE — Progress Notes (Signed)
[facility] [phone] Progress Note  Sandra Miller [id number] 39 y.o.  06/24/2015 3:57 PM  Chief Complaint  Patient presents with  . Agitation  . Follow-up  . Fatigue  . Depression    History of Present Illness: Sandra Miller came for the appointment.  She is complaining of increased irritability, anger, depression.  She is not sure what causing it but she appears very frustrated and tearful.  Finally her children's are leaving and she told that she supposed to be happy about it but she has noticed more irritability and anger.  She did not go last night to attend school because she was very upset.  Patient has history of anger issues .  She is compliant with Zyprexa and Prozac.  She admitted sleeping too much and she has no desire to do anything.  She gets tired easily.  She is socially withdrawn, isolated and usually stays in her bed most of the time.  She told that she is working 24 hours a week and job is not stressful.  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  Though she denies any hallucination or any paranoia but admitted sometime feeling hopeless helpless and worthless.  She denies any suicidal thoughts.  She is easily tearful and crying and appears to be in distress.  However patient did not know why she is frustrated.  She is taking Topamax, Prozac, Zyprexa and Ativan as needed.  She denies any panic attack.  She denies drinking or using any illegal substances.  She is watching her calorie intake and her vitals are stable.  Suicidal Ideation: No Plan Formed: No Patient has means to carry out plan: No  Homicidal Ideation: No Plan Formed: No Patient has means to carry out plan: No  Review of Systems  Constitutional: Positive for malaise/fatigue. Negative for weight loss.  Cardiovascular: Negative for chest pain and palpitations.  Gastrointestinal: Negative for heartburn and nausea.  Musculoskeletal: Negative.    Neurological: Positive for headaches. Negative for dizziness, tingling and tremors.  Psychiatric/Behavioral: Positive for depression. Negative for suicidal ideas, hallucinations and substance abuse. The patient is nervous/anxious.        Agitation and irritability    Psychiatric: Agitation: Irritability Hallucination: No Depressed Mood: Yes Insomnia: No Hypersomnia: Yes Altered Concentration: No Feels Worthless: Yes Grandiose Ideas: No Belief In Special Powers: No New/Increased Substance Abuse: No Compulsions: No  Neurologic: Headache: Yes Seizure: No Paresthesias: No  Medical history. Patient has history of hypertension , migraine headache and GERD.    Outpatient Encounter Prescriptions as of 06/24/2015  Medication Sig  . FLUoxetine (PROZAC) 40 MG capsule Take 1 capsule (40 mg total) by mouth daily.  Marland Kitchen LORazepam (ATIVAN) 1 MG tablet Take 1 tablet (1 mg total) by mouth at bedtime as needed for anxiety.  . metroNIDAZOLE (METROGEL) 0.75 % vaginal gel Place 1 Applicatorful vaginally 2 (two) times daily. for 5 days (Patient not taking: Reported on 02/26/2015)  . Multiple Vitamin (MULTIVITAMIN) tablet Take 1 tablet by mouth daily.  Marland Kitchen OLANZapine (ZYPREXA) 10 MG tablet Take 1 tablet (10 mg total) by mouth at bedtime.  Marland Kitchen omeprazole (PRILOSEC) 40 MG capsule take 1 capsule by mouth twice a day  . topiramate (TOPAMAX) 25 MG tablet Take 2 tablets (50 mg total) by mouth at bedtime.  . traZODone (DESYREL) 100 MG tablet Take 1 tablet (100 mg total) by mouth at bedtime.  . valACYclovir (VALTREX) 500 MG tablet Take 1 tablet (500  mg total) by mouth daily as needed (break outs).  . [DISCONTINUED] docusate sodium (COLACE) 100 MG capsule Take 1 capsule (100 mg total) by mouth 2 (two) times daily. (Patient not taking: Reported on 02/26/2015)  . [DISCONTINUED] fluconazole (DIFLUCAN) 150 MG tablet Take 1 tablet (150 mg total) by mouth once. (Patient not taking: Reported on 02/26/2015)  . [DISCONTINUED]  fluconazole (DIFLUCAN) 150 MG tablet Take 1 tablet (150 mg total) by mouth daily. (Patient not taking: Reported on 02/26/2015)  . [DISCONTINUED] FLUoxetine (PROZAC) 10 MG capsule Take 3 capsules (30 mg total) by mouth daily. TAKE 3 CAPSULES DAILY.  . [DISCONTINUED] imiquimod (ALDARA) 5 % cream Apply topically 3 (three) times a week. (Patient not taking: Reported on 02/26/2015)  . [DISCONTINUED] nitrofurantoin, macrocrystal-monohydrate, (MACROBID) 100 MG capsule Take 1 capsule (100 mg total) by mouth 2 (two) times daily. (Patient not taking: Reported on 02/26/2015)  . [DISCONTINUED] Nystatin POWD 1 application by Does not apply route 2 (two) times daily.  . [DISCONTINUED] OLANZapine (ZYPREXA) 10 MG tablet Take 1 tablet (10 mg total) by mouth at bedtime.  . [DISCONTINUED] polyethylene glycol powder (GLYCOLAX/MIRALAX) powder Take 17 g by mouth 2 (two) times daily as needed. (Patient not taking: Reported on 02/26/2015)  . [DISCONTINUED] promethazine (PHENERGAN) 25 MG tablet Take 1 tablet (25 mg total) by mouth every 6 (six) hours as needed for nausea.  . [DISCONTINUED] topiramate (TOPAMAX) 25 MG tablet Take 2 tablets (50 mg total) by mouth at bedtime.  . [DISCONTINUED] traZODone (DESYREL) 100 MG tablet Take 1 tablet (100 mg total) by mouth at bedtime.   No facility-administered encounter medications on file as of 06/24/2015.    Past Psychiatric History/Hospitalization(s): Patient has history of bipolar disorder, alcohol dependence and marijuana abuse.  She has seen in this office on and off since 2008.  She is at lease 6 psychiatric hospitalization.  She's been admitted in behavioral [place] in October 2008.  In the past she had tried lithium, Seroquel, Lamictal, Paxil, Celexa, Depakote, Neurontin and Cymbalta with limited response.   Anxiety: Yes Bipolar Disorder: Yes Depression: Yes Mania: Yes Psychosis: Yes Schizophrenia: No Personality Disorder: No Hospitalization for psychiatric illness:  Yes History of Electroconvulsive Shock Therapy: No Prior Suicide Attempts: No  Physical Exam: Constitutional:  BP 136/84 mmHg  Pulse 92  Ht 5' 5.5" (1.664 m)  Wt 184 lb (83.462 kg)  BMI 30.14 kg/m2  Recent Results (from the past 2160 hour(s))  PPD     Status: Normal   Collection Time: 04/29/15  2:44 PM  Result Value Ref Range   TB Skin Test Negative    Induration 0 mm   General Appearance: well nourished,   Musculoskeletal: Strength & Muscle Tone: within normal limits Gait & Station: normal Patient leans: N/A  Mental status examination Patient is casually dressed and fairly groomed.  She appeared anxious and easily tearful.  She maintained fair eye contact. Her speech is spontaneous, clear and coherent  She describes her mood depressed and tearful.  Her affect is constricted.  She has no tremors or shakes.  She denies any auditory or visual hallucination.  She has no paranoia or any obsessive thoughts.  She denies any active or passive suicidal thoughts or homicidal thoughts.  Her fund of knowledge is adequate.  Her memory is intact.  Her psychomotor activity is normal.  She has no tremors or shakes.  She is alert and oriented x3.  Her insight judgment and impulse control is okay.  Established Problem, Stable/Improving (1),  New problem, with additional work up planned, Review of Psycho-Social Stressors (1), Established Problem, Worsening (2), Review of Last Therapy Session (1), Review of Medication Regimen & Side Effects (2) and Review of New Medication or Change in Dosage (2)  Assessment: Axis I: Bipolar disorder, NOS  Axis II: Deferred  Axis III:  Patient Active Problem List   Diagnosis Date Noted  . Yeast infection of the vagina 01/30/2015  . BV (bacterial vaginosis) 01/30/2015  . Frequent urination 12/06/2014  . Leukocytosis 10/24/2014  . Chest pain 10/22/2014  . Neck pain on left side 10/22/2014  . Smoker 10/22/2014  . Palpitations 07/25/2014  . Genital warts  05/20/2014  . Epidermal inclusion cyst 04/29/2014  . Paresthesia of left upper limb 12/26/2013  . Atypical chest pain 04/22/2013  . Left knee pain 01/09/2013  . GERD (gastroesophageal reflux disease) 12/26/2012  . Stomach burning 12/26/2012  . Right wrist pain 11/16/2012  . Recurrent boils 09/14/2012  . Tobacco abuse 08/22/2012  . Esophageal dysphagia 10/28/2011  . HELICOBACTER PYLORI GASTRITIS, HX OF 03/20/2009  . HYPERTENSION NEC 03/19/2009  . NEPHROLITHIASIS, HX OF 03/19/2009  . HIATAL HERNIA WITH REFLUX 03/16/2009  . HSV 02/03/2007  . DISORDER, BIPOLAR NOS 02/03/2007  . HX, PERSONAL, GENITAL/OBSTETRIC DISORDER NEC 02/03/2007    Plan:  Discuss psychosocial stressors.  Patient is not sure what causing her more irritable and depression.  Her kids are leaving and she likes it but she is not feeling it.  I recommended to increase Prozac 40 mg daily.  I also referred outpatient program however due to her insurance she did not qualify.  But she agreed to go [facility] for anger management and group therapy.  She also agreed to see a therapist individual once she finished the program.  We will schedule appointment with [name] in this office for coping skills.  I suggested to cut down her work if it may causing stress.  Patient promised that she will look into that.  We discussed safety plan that anytime having active suicidal thoughts or homicidal thoughts and she need to call 911 or go to the local emergency room.  Continue Zyprexa 10 mg at bedtime, Topamax 50 mg at bedtime and trazodone 100 mg at bedtime.  She is taking lorazepam only as needed.  I will see her again in 4 weeks.    Khang Hannum T., MD 06/24/2015

## 2015-07-17 ENCOUNTER — Other Ambulatory Visit: Payer: Self-pay | Admitting: Internal Medicine

## 2015-07-22 ENCOUNTER — Ambulatory Visit (HOSPITAL_COMMUNITY): Payer: Self-pay | Admitting: Psychiatry

## 2015-07-28 ENCOUNTER — Ambulatory Visit (HOSPITAL_COMMUNITY): Payer: Self-pay | Admitting: Clinical

## 2015-08-11 ENCOUNTER — Encounter (HOSPITAL_COMMUNITY): Payer: Self-pay | Admitting: Psychiatry

## 2015-08-11 ENCOUNTER — Ambulatory Visit (INDEPENDENT_AMBULATORY_CARE_PROVIDER_SITE_OTHER): Payer: Medicare Other | Admitting: Psychiatry

## 2015-08-11 VITALS — BP 127/86 | HR 88 | Ht 65.5 in | Wt 178.6 lb

## 2015-08-11 DIAGNOSIS — F319 Bipolar disorder, unspecified: Secondary | ICD-10-CM | POA: Diagnosis not present

## 2015-08-11 MED ORDER — LORAZEPAM 1 MG PO TABS
1.0000 mg | ORAL_TABLET | Freq: Every evening | ORAL | Status: DC | PRN
Start: 2015-08-11 — End: 2015-11-10

## 2015-08-11 MED ORDER — FLUOXETINE HCL 40 MG PO CAPS: 40.0000 mg | ORAL_CAPSULE | Freq: Every day | ORAL | Status: DC

## 2015-08-11 MED ORDER — TOPIRAMATE 25 MG PO TABS: 50.0000 mg | ORAL_TABLET | Freq: Every day | ORAL | Status: DC

## 2015-08-11 MED ORDER — TRAZODONE HCL 100 MG PO TABS: 100.0000 mg | ORAL_TABLET | Freq: Every day | ORAL | Status: DC

## 2015-08-11 MED ORDER — OLANZAPINE 10 MG PO TABS: 10.0000 mg | ORAL_TABLET | Freq: Every day | ORAL | Status: DC

## 2015-08-11 NOTE — Progress Notes (Signed)
[facility] [phone] Progress Note  QUANISHA PIASECKI [id number] 39 y.o.  08/11/2015 4:33 PM  Chief Complaint  Patient presents with  . Follow-up    History of Present Illness: Dawnya came for the appointment.  She has been sick for past few weeks.  She is complaining of throwing up, nausea, weight loss and feeling fatigue and tired.  She had a quite Thanksgiving.  She is relieved that her son finally moved out but physically she is not getting better.  She admitted sometimes she get depressed due to her physical condition.  She has not seen her primary care physician yet but planning to see her soon.  She admitted due to throwing up she had missed a few doses of the medication.  But she denies any paranoia, hallucination, suicidal thoughts or homicidal thoughts.  She sleeping good.  She continues to work and continues to go school.  She has not taken any time mouth but she feels if she continues to have physical sickness she is thinking to take some time off.  She has lost weight from the past.  She admitted decrease appetite and her energy level is low.  Patient denies drinking or using any illegal substances.  She is easily tearful but denies any active or passive suicidal thoughts or homicidal thought.  Suicidal Ideation: No Plan Formed: No Patient has means to carry out plan: No  Homicidal Ideation: No Plan Formed: No Patient has means to carry out plan: No  Review of Systems  Constitutional: Positive for weight loss and malaise/fatigue.  Cardiovascular: Negative for chest pain and palpitations.  Gastrointestinal: Positive for nausea and vomiting. Negative for heartburn.  Musculoskeletal: Negative.   Neurological: Positive for headaches. Negative for dizziness, tingling and tremors.  Psychiatric/Behavioral: Positive for depression. Negative for suicidal ideas, hallucinations and substance abuse.    Psychiatric: Agitation: Irritability Hallucination: No Depressed Mood:  Yes Insomnia: No Hypersomnia: Yes Altered Concentration: No Feels Worthless: No Grandiose Ideas: No Belief In Special Powers: No New/Increased Substance Abuse: No Compulsions: No  Neurologic: Headache: Yes Seizure: No Paresthesias: No  Medical history. Patient has history of hypertension , migraine headache and GERD.    Outpatient Encounter Prescriptions as of 08/11/2015  Medication Sig  . FLUoxetine (PROZAC) 40 MG capsule Take 1 capsule (40 mg total) by mouth daily.  Marland Kitchen LORazepam (ATIVAN) 1 MG tablet Take 1 tablet (1 mg total) by mouth at bedtime as needed for anxiety.  . metroNIDAZOLE (METROGEL) 0.75 % vaginal gel Place 1 Applicatorful vaginally 2 (two) times daily. for 5 days (Patient not taking: Reported on 02/26/2015)  . Multiple Vitamin (MULTIVITAMIN) tablet Take 1 tablet by mouth daily.  Marland Kitchen OLANZapine (ZYPREXA) 10 MG tablet Take 1 tablet (10 mg total) by mouth at bedtime.  Marland Kitchen omeprazole (PRILOSEC) 40 MG capsule take 1 capsule by mouth twice a day  . topiramate (TOPAMAX) 25 MG tablet Take 2 tablets (50 mg total) by mouth at bedtime.  . traZODone (DESYREL) 100 MG tablet Take 1 tablet (100 mg total) by mouth at bedtime.  . valACYclovir (VALTREX) 500 MG tablet Take 1 tablet (500 mg total) by mouth daily as needed (break outs).  . [DISCONTINUED] FLUoxetine (PROZAC) 40 MG capsule Take 1 capsule (40 mg total) by mouth daily.  . [DISCONTINUED] LORazepam (ATIVAN) 1 MG tablet Take 1 tablet (1 mg total) by mouth at bedtime as needed for anxiety.  . [DISCONTINUED] OLANZapine (ZYPREXA) 10 MG tablet Take 1 tablet (10 mg total) by mouth at bedtime.  . [  DISCONTINUED] topiramate (TOPAMAX) 25 MG tablet Take 2 tablets (50 mg total) by mouth at bedtime.  . [DISCONTINUED] traZODone (DESYREL) 100 MG tablet Take 1 tablet (100 mg total) by mouth at bedtime.   No facility-administered encounter medications on file as of 08/11/2015.    Past Psychiatric History/Hospitalization(s): Patient has history of  bipolar disorder, alcohol dependence and marijuana abuse.  She has seen in this office on and off since 2008.  She is at [facility] psychiatric hospitalization.  She's been admitted in behavioral [place] in October 2008.  In the past she had tried lithium, Seroquel, Lamictal, Paxil, Celexa, Depakote, Neurontin and Cymbalta with limited response.   Anxiety: Yes Bipolar Disorder: Yes Depression: Yes Mania: Yes Psychosis: Yes Schizophrenia: No Personality Disorder: No Hospitalization for psychiatric illness: Yes History of Electroconvulsive Shock Therapy: No Prior Suicide Attempts: No  Physical Exam: Constitutional:  BP 127/86 mmHg  Pulse 88  Ht 5' 5.5" (1.664 m)  Wt 178 lb 9.6 oz (81.012 kg)  BMI 29.26 kg/m2  No results found for this or any previous visit (from the past 2160 hour(s)). General Appearance: well nourished,   Musculoskeletal: Strength & Muscle Tone: within normal limits Gait & Station: normal Patient leans: N/A  Mental status examination Patient is casually dressed and fairly groomed.  She appeared anxious and tired.  She maintained fair eye contact. Her speech is slow , spontaneous, clear and coherent  She describes her mood depressed and tired.  Her affect is constricted.  She has no tremors or shakes.  She denies any auditory or visual hallucination.  She has no paranoia or any obsessive thoughts.  She denies any active or passive suicidal thoughts or homicidal thoughts.  Her fund of knowledge is adequate.  Her memory is intact.  Her psychomotor activity is normal.  She has no tremors or shakes.  She is alert and oriented x3.  Her insight judgment and impulse control is okay.  Established Problem, Stable/Improving (1), New problem, with additional work up planned, Review of Psycho-Social Stressors (1), Established Problem, Worsening (2), Review of Last Therapy Session (1) and Review of Medication Regimen & Side Effects (2)  Assessment: Axis I: Bipolar disorder,  NOS  Axis II: Deferred  Axis III:  Patient Active Problem List   Diagnosis Date Noted  . Yeast infection of the vagina 01/30/2015  . BV (bacterial vaginosis) 01/30/2015  . Frequent urination 12/06/2014  . Leukocytosis 10/24/2014  . Chest pain 10/22/2014  . Neck pain on left side 10/22/2014  . Smoker 10/22/2014  . Palpitations 07/25/2014  . Genital warts 05/20/2014  . Epidermal inclusion cyst 04/29/2014  . Paresthesia of left upper limb 12/26/2013  . Atypical chest pain 04/22/2013  . Left knee pain 01/09/2013  . GERD (gastroesophageal reflux disease) 12/26/2012  . Stomach burning 12/26/2012  . Right wrist pain 11/16/2012  . Recurrent boils 09/14/2012  . Tobacco abuse 08/22/2012  . Esophageal dysphagia 10/28/2011  . HELICOBACTER PYLORI GASTRITIS, HX OF 03/20/2009  . HYPERTENSION NEC 03/19/2009  . NEPHROLITHIASIS, HX OF 03/19/2009  . HIATAL HERNIA WITH REFLUX 03/16/2009  . HSV 02/03/2007  . DISORDER, BIPOLAR NOS 02/03/2007  . HX, PERSONAL, GENITAL/OBSTETRIC DISORDER NEC 02/03/2007    Plan:  Patient is physically stressed out and she is having nausea and vomiting and she has lost weight from the past.  I encouraged to see primary care physician for physical checkup.  At this time patient does not want to change her medication.  Discuss psychosocial stressors.  She  believe her depression could be due to physical illness.  She has not seen therapist so far but promised to see if needed in the future. I suggested to cut down her work if it may causing stress.  Patient promised that she will look into that.  We discussed safety plan that anytime having active suicidal thoughts or homicidal thoughts and she need to call 911 or go to the local emergency room.  Continue Zyprexa 10 mg at bedtime, Topamax 50 mg at bedtime and trazodone 100 mg at bedtime.  She is taking lorazepam only as needed.  I will see her again in 12 weeks.    Ariany Kesselman T.,  MD 08/11/2015

## 2015-08-26 ENCOUNTER — Ambulatory Visit (HOSPITAL_COMMUNITY): Payer: Self-pay | Admitting: Clinical

## 2015-09-08 ENCOUNTER — Ambulatory Visit (INDEPENDENT_AMBULATORY_CARE_PROVIDER_SITE_OTHER): Payer: Medicare Other | Admitting: Family Medicine

## 2015-09-08 ENCOUNTER — Encounter: Payer: Self-pay | Admitting: Family Medicine

## 2015-09-08 VITALS — BP 132/77 | HR 71 | Temp 98.7°F | Wt 174.0 lb

## 2015-09-08 DIAGNOSIS — R202 Paresthesia of skin: Secondary | ICD-10-CM

## 2015-09-08 DIAGNOSIS — L732 Hidradenitis suppurativa: Secondary | ICD-10-CM

## 2015-09-08 DIAGNOSIS — B373 Candidiasis of vulva and vagina: Secondary | ICD-10-CM | POA: Diagnosis not present

## 2015-09-08 DIAGNOSIS — R599 Enlarged lymph nodes, unspecified: Secondary | ICD-10-CM

## 2015-09-08 DIAGNOSIS — R634 Abnormal weight loss: Secondary | ICD-10-CM

## 2015-09-08 DIAGNOSIS — B3731 Acute candidiasis of vulva and vagina: Secondary | ICD-10-CM

## 2015-09-08 DIAGNOSIS — R591 Generalized enlarged lymph nodes: Secondary | ICD-10-CM | POA: Insufficient documentation

## 2015-09-08 LAB — COMPLETE METABOLIC PANEL WITH GFR
ALT: 9 U/L (ref 6–29)
AST: 16 U/L (ref 10–30)
Albumin: 3.9 g/dL (ref 3.6–5.1)
Alkaline Phosphatase: 49 U/L (ref 33–115)
BUN: 5 mg/dL — AB (ref 7–25)
CHLORIDE: 107 mmol/L (ref 98–110)
CO2: 26 mmol/L (ref 20–31)
Calcium: 9.5 mg/dL (ref 8.6–10.2)
Creat: 0.72 mg/dL (ref 0.50–1.10)
GFR, Est African American: >89 mL/min (ref >=60)
GLUCOSE: 85 mg/dL (ref 65–99)
POTASSIUM: 3.1 mmol/L — AB (ref 3.5–5.3)
Sodium: 142 mmol/L (ref 135–146)
Total Bilirubin: 0.4 mg/dL (ref 0.2–1.2)
Total Protein: 6.5 g/dL (ref 6.1–8.1)

## 2015-09-08 LAB — CBC WITH DIFFERENTIAL/PLATELET
Basophils Absolute: 0 10*3/uL (ref 0.0–0.1)
Basophils Relative: 0 % (ref 0–1)
EOS PCT: 2 % (ref 0–5)
Eosinophils Absolute: 0.2 10*3/uL (ref 0.0–0.7)
HEMATOCRIT: 36.5 % (ref 36.0–46.0)
HEMOGLOBIN: 12.6 g/dL (ref 12.0–15.0)
LYMPHS ABS: 2.6 10*3/uL (ref 0.7–4.0)
LYMPHS PCT: 34 % (ref 12–46)
MCH: 29.9 pg (ref 26.0–34.0)
MCHC: 34.5 g/dL (ref 30.0–36.0)
MCV: 86.5 fL (ref 78.0–100.0)
MPV: 9.6 fL (ref 8.6–12.4)
Monocytes Absolute: 0.6 10*3/uL (ref 0.1–1.0)
Monocytes Relative: 8 % (ref 3–12)
NEUTROS ABS: 4.3 10*3/uL (ref 1.7–7.7)
Neutrophils Relative %: 56 % (ref 43–77)
Platelets: 321 10*3/uL (ref 150–400)
RBC: 4.22 MIL/uL (ref 3.87–5.11)
RDW: 14.8 % (ref 11.5–15.5)
WBC: 7.6 10*3/uL (ref 4.0–10.5)

## 2015-09-08 LAB — POCT SEDIMENTATION RATE: POCT SED RATE: 29 mm/hr — AB (ref 0–22)

## 2015-09-08 MED ORDER — FLUCONAZOLE 150 MG PO TABS: 150.0000 mg | ORAL_TABLET | Freq: Once | ORAL | Status: DC

## 2015-09-08 NOTE — Progress Notes (Signed)
   Subjective:   Sandra Miller is a 40 y.o. female with a history of HTN, GERD, bipolar disorder, recurrent boils here for same day appt for bumps on back of neck  Patient reports she has noticed 2 bumps on the back of her neck on the left side. She for stenoses last week after getting a haircut. She denies any change in size, drainage, fevers. She reports she's never had anything like this before.  She also reports a 7 pound weight loss in 3 months. She denies trying to lose weight. She does report decreased appetite, reporting she mostly drinks Coke throughout the day and does not eat.  She has noted that her white blood cell count has been elevated during her last 2 ED visits that were for musculoskeletal complaints. Her psychiatrist pointed out that weight loss to her and wanted her to be checked out by her primary care doctor before he looked into psychiatric causes of weight loss. She denies any change in stools, hemoptysis, night sweats, chills, shortness of breath.  She is concerned because her father died of lung cancer in his 74s.  Review of Systems:  Per HPI. All other systems reviewed and are negative.   PMH, PSH, Medications, Allergies, and FmHx reviewed and updated in EMR.  Social History: current smoker - 1 ppd  Objective:  BP 132/77 mmHg  Pulse 71  Temp(Src) 98.7 F (37.1 C) (Oral)  Wt 174 lb (78.926 kg)  Gen:  40 y.o. female in NAD HEENT: NCAT, MMM, EOMI, PERRL, anicteric sclerae Neck: 2 small mobile, nontender lymph nodes in posterior cervical chain on left side CV: RRR, no MRG Resp: Non-labored, CTAB, no wheezes noted Abd: Soft, NTND, BS present, no guarding or organomegaly Ext: WWP, no edema MSK: No obvious deformities Neuro: Alert and oriented, speech normal     Assessment & Plan:     Sandra Miller is a 40 y.o. female here for lymphadenopathy and weight loss  Loss of weight No B symptoms or symptoms concerning for colon or lung cancer Patient with  recent normal Pap smear in 2016 CBC, CMP, ESR, HIV collected today Follow-up in one month  Lymphadenopathy of head and neck 2 shotty lymph nodes present on posterior left neck Likely residual from recent viral illness or other reactive Continue to monitor Return precautions given, including increase in size, B symptoms  Hydradenitis Did not examine New referral placed for dermatology  Paresthesia of left upper limb Did not examine New Referral placed for neurosurgery     Sandra Crews, MD MPH PGY-2,  [facility] 09/08/2015  4:40 PM

## 2015-09-08 NOTE — Assessment & Plan Note (Signed)
Did not examine New Referral placed for neurosurgery

## 2015-09-08 NOTE — Assessment & Plan Note (Signed)
2 shotty lymph nodes present on posterior left neck Likely residual from recent viral illness or other reactive Continue to monitor Return precautions given, including increase in size, B symptoms

## 2015-09-08 NOTE — Assessment & Plan Note (Signed)
Did not examine New referral placed for dermatology

## 2015-09-08 NOTE — Patient Instructions (Signed)
Nice to meet you today. I like to see back in about a month to continue to follow-up on your weight loss. We will also follow these lymph nodes on your neck and make sure they aren't getting any bigger. I have put in new referrals for the dermatologist and the neurosurgeons at your request. We are getting some labs today and someone will call you or send you a letter with the results when they're available. These are to check for other causes of weight loss.  Take care, Dr. Jacinto Reap

## 2015-09-08 NOTE — Assessment & Plan Note (Signed)
No B symptoms or symptoms concerning for colon or lung cancer Patient with recent normal Pap smear in 2016 CBC, CMP, ESR, HIV collected today Follow-up in one month

## 2015-09-09 ENCOUNTER — Encounter: Payer: Self-pay | Admitting: Family Medicine

## 2015-09-09 ENCOUNTER — Telehealth: Payer: Self-pay | Admitting: Family Medicine

## 2015-09-09 LAB — HIV ANTIBODY (ROUTINE TESTING W REFLEX): HIV: NONREACTIVE

## 2015-09-09 NOTE — Telephone Encounter (Signed)
LMOVM for patient to return call. Please advise her that she has another appt with [facility] on Thurs 1/19 @ 3:00 PM. If this appt is missed/no-showed, she will not be able to reschedule and this is the only derm clinic that accepts her insurance in [place].

## 2015-09-10 ENCOUNTER — Ambulatory Visit (HOSPITAL_COMMUNITY): Payer: Self-pay | Admitting: Clinical

## 2015-09-24 DIAGNOSIS — L720 Epidermal cyst: Secondary | ICD-10-CM | POA: Diagnosis not present

## 2015-09-24 DIAGNOSIS — D2371 Other benign neoplasm of skin of right lower limb, including hip: Secondary | ICD-10-CM | POA: Diagnosis not present

## 2015-11-03 ENCOUNTER — Ambulatory Visit (INDEPENDENT_AMBULATORY_CARE_PROVIDER_SITE_OTHER): Payer: Medicare Other | Admitting: Family Medicine

## 2015-11-03 ENCOUNTER — Encounter: Payer: Self-pay | Admitting: Family Medicine

## 2015-11-03 VITALS — BP 145/75 | HR 77 | Temp 98.3°F | Wt 173.0 lb

## 2015-11-03 DIAGNOSIS — L732 Hidradenitis suppurativa: Secondary | ICD-10-CM

## 2015-11-03 MED ORDER — CLINDAMYCIN HCL 300 MG PO CAPS: 300.0000 mg | ORAL_CAPSULE | Freq: Four times a day (QID) | ORAL | Status: DC

## 2015-11-03 MED ORDER — IBUPROFEN 600 MG PO TABS: 600.0000 mg | ORAL_TABLET | Freq: Four times a day (QID) | ORAL | Status: DC | PRN

## 2015-11-03 NOTE — Patient Instructions (Signed)
Thank you for coming in to clinic today.  1. You most likely have recurrent cysts under arms called Hidradenitis Suppurativa, often these cysts can become infected. I think it appears infected today. - Recommend taking Clindamycin antibiotic 300mg  capsules every 6 hours or 4 times a day for next 7 days, finish entire course. - Warning that you may develop some loose stools or stomach upset on this and most anti biotics, if you get watery diarrhea with abdominal pain for >2 days then stop taking it and return to our office - Use warm or hot compresses multiple times a day 3 to 5 times, and warm water soaks as well to help it possibly drain - Rx for Ibuprofen 600mg  every 6 hours with food and water for pain, may take Tylenol between doses also - DO NOT TAKE any ibuprofen, aleve, motrin while you are taking this medicine - It is safe to take Tylenol Ext Str 500mg  tabs - take 1 to 2 (max dose 1000mg ) every 6 hours as needed for breakthrough pain, max 24 hour daily dose is 6 to 8 tablets or 4000mg   If symptoms significantly worsen with fevers/chills, spreading redness, nausea, vomiting cant tolerate medications or food/drink, worsening pain or new symptoms return sooner or go to the ED.  Please schedule a follow-up appointment with Dr Brita Romp or ANY available doctor here, by Thursday or Friday this week to re-evaluate Left Axilla Abscess  If you have any other questions or concerns, please feel free to call the clinic to contact me. You may also schedule an earlier appointment if necessary.  However, if your symptoms get significantly worse, please go to the Emergency Department to seek immediate medical attention.  Nobie Putnam, [place]

## 2015-11-03 NOTE — Progress Notes (Signed)
Subjective:    Patient ID: Sandra Miller, female    DOB: Jan 04, 1976, 40 y.o.   MRN: [id number]  Sandra Miller is a 40 y.o. female presenting on 11/03/2015 for under arm cyst  HPI  HIDRADENITIS SUPPURATIVA, LEFT AXILLA Reports cyst under Left axilla, started about 2 days ago, seems to be worsening with increased size and tenderness. Doing daily soaking in hot bath, no compresses. Tried Tylenol for pain without relief. - Chronic history of recurrent cysts over past several years, also family history of father with recurrent boils. Established with Dermatology [facility]), about 1 month ago for recurrent cysts and not boils. Stated these were not boils. He was going to schedule follow-up with a Psychiatric nurse.  History of allergic reaction with hives to Minocycline (2013), she is unsure if she has been treated with Doxycycline in the past. Not listed on current record.  Social History  Substance Use Topics  . Smoking status: Current Every Day Smoker -- 1.00 packs/day for 22 years    Types: Cigarettes  . Smokeless tobacco: Never Used     Comment: form given 10-27-11/patches are not covered under insurance  . Alcohol Use: No     Comment: sober for 9 months    Review of Systems Per HPI unless specifically indicated above  - Denies recent illness, fevers/chills, redness or rash, nausea, vomiting     Objective:    BP 145/75 mmHg  Pulse 77  Temp(Src) 98.3 F (36.8 C) (Oral)  Wt 173 lb (78.472 kg)  Wt Readings from Last 3 Encounters:  11/03/15 173 lb (78.472 kg)  09/08/15 174 lb (78.926 kg)  08/11/15 178 lb 9.6 oz (81.012 kg)    Physical Exam  Constitutional: She appears well-developed and well-nourished. No distress.  Well-appearing, cooperative, comfortable  HENT:  Mouth/Throat: Oropharynx is clear and moist.  Neck: Normal range of motion. Neck supple.  Cardiovascular: Normal rate.   No murmur heard. Abdominal: Bowel sounds are normal.  Lymphadenopathy:    She has no cervical adenopathy.  Neurological: She is alert.  Skin: Skin is warm and dry. She is not diaphoretic.  Left axilla with 2 x 1.5 cm mild erythematous tender indurated raised area, no clear fluctuance or drainage on palpation.  Nursing note and vitals reviewed.      Assessment & Plan:   Problem List Items Addressed This Visit    Hidradenitis suppurativa of left axilla - Primary    Acute left axillary abscess with firm induration without surrounding cellulitis or any systemic symptoms. Significant history of recurrent cysts / abscesses, in axilla and groin consistent with hidradenitis, already established with Derm, was referred to Plastics. No history of MRSA  Plan: 1. Considered I&D, no clear fluctuance, seems more indurated, discussed with attending and patient, decision to hold I&D (also given frequent prior I&Ds, and now needing more definitive surgical removal) 2. Start Clindamycin 300mg  QID x 7 days (potential MRSA coverage), no doxy due to minocycline allergy with hives 3. Ibuprofen 600 q 6 hr PRN pain rx 4. Warm compresses per handout 5. RTC 2 days re-evaluation, may need I&D vs prolonged antibiotics, return criteria given, ultimately advised needs to follow-up with Derm/Plastics regarding surgical eval      Relevant Medications   clindamycin (CLEOCIN) 300 MG capsule   ibuprofen (ADVIL,MOTRIN) 600 MG tablet      Meds ordered this encounter  Medications  . clindamycin (CLEOCIN) 300 MG capsule    Sig: Take 1 capsule (300 mg total) by  mouth 4 (four) times daily. For 7 days    Dispense:  28 capsule    Refill:  0  . ibuprofen (ADVIL,MOTRIN) 600 MG tablet    Sig: Take 1 tablet (600 mg total) by mouth every 6 (six) hours as needed for moderate pain.    Dispense:  30 tablet    Refill:  0      Follow up plan: Return in about 3 days (around 11/06/2015) for follow-up Left axillary abscess.  Nobie Putnam, [place], PGY-3

## 2015-11-04 NOTE — Assessment & Plan Note (Addendum)
Acute left axillary abscess with firm induration without surrounding cellulitis or any systemic symptoms. Significant history of recurrent cysts / abscesses, in axilla and groin consistent with hidradenitis, already established with Derm, was referred to Plastics. No history of MRSA  Plan: 1. Considered I&D, no clear fluctuance, seems more indurated, discussed with attending and patient, decision to hold I&D (also given frequent prior I&Ds, and now needing more definitive surgical removal) 2. Start Clindamycin 300mg  QID x 7 days (potential MRSA coverage), no doxy due to minocycline allergy with hives 3. Ibuprofen 600 q 6 hr PRN pain rx 4. Warm compresses per handout 5. RTC 2 days re-evaluation, may need I&D vs prolonged antibiotics, return criteria given, ultimately advised needs to follow-up with Derm/Plastics regarding surgical eval

## 2015-11-06 ENCOUNTER — Encounter: Payer: Self-pay | Admitting: Family Medicine

## 2015-11-06 ENCOUNTER — Ambulatory Visit (INDEPENDENT_AMBULATORY_CARE_PROVIDER_SITE_OTHER): Payer: Medicare Other | Admitting: Family Medicine

## 2015-11-06 VITALS — BP 142/79 | HR 64 | Temp 98.1°F | Wt 174.1 lb

## 2015-11-06 DIAGNOSIS — L732 Hidradenitis suppurativa: Secondary | ICD-10-CM

## 2015-11-06 DIAGNOSIS — I10 Essential (primary) hypertension: Secondary | ICD-10-CM | POA: Diagnosis not present

## 2015-11-06 MED ORDER — FLUCONAZOLE 150 MG PO TABS: 150.0000 mg | ORAL_TABLET | Freq: Once | ORAL | Status: DC

## 2015-11-06 NOTE — Assessment & Plan Note (Signed)
Currently elevated Patient does report pain and stress currently We'll follow-up in one month and decide on need for antihypertensive therapy

## 2015-11-06 NOTE — Patient Instructions (Signed)
Continue to use warm compresses to help this drain.  Take the rest of the clindamycin.  You can use diflucan if you have symptoms of yeast infection after finishing clindamycin.  Hidradenitis Suppurativa Hidradenitis suppurativa is a long-term (chronic) skin disease that starts with blocked sweat glands or hair follicles. Bacteria may grow in these blocked openings of your skin. Hidradenitis suppurativa is like a severe form of acne that develops in areas of your body where acne would be unusual. It is most likely to affect the areas of your body where skin rubs against skin and becomes moist. This includes your:  Underarms.  Groin.  Genital areas.  Buttocks.  Upper thighs.  Breasts. Hidradenitis suppurativa may start out with small pimples. The pimples can develop into deep sores that break open (rupture) and drain pus. Over time your skin may thicken and become scarred. Hidradenitis suppurativa cannot be passed from person to person.  CAUSES  The exact cause of hidradenitis suppurativa is not known. This condition may be due to:  Female and female hormones. The condition is rare before and after puberty.  An overactive body defense system (immune system). Your immune system may overreact to the blocked hair follicles or sweat glands and cause swelling and pus-filled sores. RISK FACTORS You may have a higher risk of hidradenitis suppurativa if you:  Are a woman.  Are between ages 14 and 67.  Have a family history of hidradenitis suppurativa.  Have a personal history of acne.  Are overweight.  Smoke.  Take the drug lithium. SIGNS AND SYMPTOMS  The first signs of an outbreak are usually painful skin bumps that look like pimples. As the condition progresses:  Skin bumps may get bigger and grow deeper into the skin.  Bumps under the skin may rupture and drain smelly pus.  Skin may become itchy and infected.  Skin may thicken and scar.  Drainage may continue through  tunnels under the skin (fistulas).  Walking and moving your arms can become painful. DIAGNOSIS  Your health care provider may diagnose hidradenitis suppurativa based on your medical history and your signs and symptoms. A physical exam will also be done. You may need to see a health care provider who specializes in skin diseases (dermatologist). You may also have tests done to confirm the diagnosis. These can include:  Swabbing a sample of pus or drainage from your skin so it can be sent to the lab and tested for infection.  Blood tests to check for infection. TREATMENT  The same treatment will not work for everybody with hidradenitis suppurativa. Your treatment will depend on how severe your symptoms are. You may need to try several treatments to find what works best for you. Part of your treatment may include cleaning and bandaging (dressing) your wounds. You may also have to take medicines, such as the following:  Antibiotics.  Acne medicines.  Medicines to block or suppress the immune system.  A diabetes medicine (metformin) is sometimes used to treat this condition.  For women, birth control pills can sometimes help relieve symptoms. You may need surgery if you have a severe case of hidradenitis suppurativa that does not respond to medicine. Surgery may involve:   Using a laser to clear the skin and remove hair follicles.  Opening and draining deep sores.  Removing the areas of skin that are diseased and scarred. HOME CARE INSTRUCTIONS  Learn as much as you can about your disease, and work closely with your health care providers.  Take medicines only as directed by your health care provider.  If you were prescribed an antibiotic medicine, finish it all even if you start to feel better.  If you are overweight, losing weight may be very helpful. Try to reach and maintain a healthy weight.  Do not use any tobacco products, including cigarettes, chewing tobacco, or electronic  cigarettes. If you need help quitting, ask your health care provider.  Do not shave the areas where you get hidradenitis suppurativa.  Do not wear deodorant.  Wear loose-fitting clothes.  Try not to overheat and get sweaty.  Take a daily bleach bath as directed by your health care provider.  Fill your bathtub halfway with water.  Pour in  cup of unscented household bleach.  Soak for 5-10 minutes.  Cover sore areas with a warm, clean washcloth (compress) for 5-10 minutes. SEEK MEDICAL CARE IF:   You have a flare-up of hidradenitis suppurativa.  You have chills or a fever.  You are having trouble controlling your symptoms at home.   This information is not intended to replace advice given to you by your health care provider. Make sure you discuss any questions you have with your health care provider.   Document Released: 04/05/2004 Document Revised: 09/12/2014 Document Reviewed: 11/22/2013 Elsevier Interactive Patient Education [employer].

## 2015-11-06 NOTE — Progress Notes (Signed)
   Subjective:   Sandra Miller is a 40 y.o. female with a history of hidradenitis suppurativa here for f/u of L axillary lesion.  Hidradenitis - L axillary boil Burst the night before last - Drainage was purulent - Nothing draining anymore - Taking clindamycin - wants diflucan for yeast infection related to antibiotics - No compresses since it burst - No changes in redness - It is tender currently - seen 2/28 and prescribed clindamycin - reports that she has heard nothing about referral to surgery that she though dermatology was putting in   HTN - used to be on propranolol and HCTZ for BP cotnrol in the past - after losing weight and stopping alcohol, BP was good - reports she is currently under stress  Review of Systems:  Per HPI.   Social History: current smoker  Objective:  BP 142/79 mmHg  Pulse 64  Temp(Src) 98.1 F (36.7 C) (Oral)  Wt 174 lb 1.6 oz (78.971 kg)  Gen:  40 y.o. female in NAD HEENT: NCAT, MMM CV: RRR, no MRG Resp: Non-labored, CTAB, no wheezes noted Ext: WWP, no edema Skin: L axilla with 2 x 1.5cm mildly erythematous tender and indurated raised area without fluctuance, open area present with purulent drainage MSK: No obvious deformities Neuro: Alert and oriented, speech normal     Assessment & Plan:     Sandra Miller is a 40 y.o. female here for hidradenitis f/u and HTN  Hidradenitis suppurativa of left axilla Abscess now open and draining purulent discharge No surrounding cellulitis and no systemic symptoms Advise continuing warm compresses and clindamycin Referral placed for general surgery for consult on hidradenitis possible surgical treatment F/u prn  HTN (hypertension) Currently elevated Patient does report pain and stress currently We'll follow-up in one month and decide on need for antihypertensive therapy     Virginia Crews, MD MPH PGY-2,  [facility] 11/06/2015  3:12 PM

## 2015-11-06 NOTE — Assessment & Plan Note (Signed)
Abscess now open and draining purulent discharge No surrounding cellulitis and no systemic symptoms Advise continuing warm compresses and clindamycin Referral placed for general surgery for consult on hidradenitis possible surgical treatment F/u prn

## 2015-11-10 ENCOUNTER — Encounter (HOSPITAL_COMMUNITY): Payer: Self-pay | Admitting: Psychiatry

## 2015-11-10 ENCOUNTER — Ambulatory Visit (INDEPENDENT_AMBULATORY_CARE_PROVIDER_SITE_OTHER): Payer: Medicare Other | Admitting: Psychiatry

## 2015-11-10 VITALS — BP 152/82 | HR 79 | Ht 65.5 in | Wt 172.8 lb

## 2015-11-10 DIAGNOSIS — F319 Bipolar disorder, unspecified: Secondary | ICD-10-CM | POA: Diagnosis not present

## 2015-11-10 DIAGNOSIS — L732 Hidradenitis suppurativa: Secondary | ICD-10-CM

## 2015-11-10 MED ORDER — TOPIRAMATE 25 MG PO TABS: 50.0000 mg | ORAL_TABLET | Freq: Every day | ORAL | Status: DC

## 2015-11-10 MED ORDER — TRAZODONE HCL 100 MG PO TABS: 100.0000 mg | ORAL_TABLET | Freq: Every day | ORAL | Status: DC

## 2015-11-10 MED ORDER — LORAZEPAM 1 MG PO TABS: 1.0000 mg | ORAL_TABLET | Freq: Every evening | ORAL | Status: DC | PRN

## 2015-11-10 MED ORDER — OLANZAPINE 15 MG PO TABS: 15.0000 mg | ORAL_TABLET | Freq: Every day | ORAL | Status: DC

## 2015-11-10 MED ORDER — FLUOXETINE HCL 40 MG PO CAPS: 40.0000 mg | ORAL_CAPSULE | Freq: Every day | ORAL | Status: DC

## 2015-11-10 NOTE — Progress Notes (Signed)
[facility] [phone] Progress Note  [name] [id number] 40 y.o.  11/10/2015 3:50 PM  Chief Complaint  Patient presents with  . Follow-up  . Stress  . Family Problem    History of Present Illness: Sandra Miller came for the appointment.  She has been under a lot of stress because of financial strain.  She has unable to work in past 30 days.  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  She is very worried about her finances and future.  She is behind bills.  She admitted irritability, anger, mood swing and anger issues.  She is getting frustrated on the phone with her family members.  Her mother asked her to move in she refused to go at her place.  She wants to live by herself.  She had applied many places and hoping to get a part-time job.  Recently she also have pain in her shoulder and on and she was diagnosed with cyst.  She is having surgical seizure on 16th.  She is taking antibiotic.  She recently seen primary care physician.  She denies any hallucination but admitted some time crying spells, social withdrawal, isolated, racing thoughts and poor sleep.  She is taking Ativan only as needed.  She is compliant with Zyprexa and Prozac.  She denies any suicidal thoughts or homicidal thought.  She is interested to see therapist to financial strain she cannot afford counselor at this time.  She promised once she is able to get a job she will start seeing therapy.  Patient denies drinking or using any illegal substances.  Her appetite is fair.  She lost a few pounds from the last visit and she reported her energy level is okay.  Suicidal Ideation: No Plan Formed: No Patient has means to carry out plan: No  Homicidal Ideation: No Plan Formed: No Patient has means to carry out plan: No  Review of Systems  Constitutional: Positive for weight loss and malaise/fatigue.  Cardiovascular: Negative for chest pain and palpitations.   Gastrointestinal: Negative for heartburn.  Musculoskeletal: Negative.   Neurological: Positive for headaches. Negative for dizziness, tingling and tremors.  Psychiatric/Behavioral: Positive for depression. Negative for suicidal ideas, hallucinations and substance abuse.    Psychiatric: Agitation: Irritability Hallucination: No Depressed Mood: Yes Insomnia: No Hypersomnia: Yes Altered Concentration: No Feels Worthless: No Grandiose Ideas: No Belief In Special Powers: No New/Increased Substance Abuse: No Compulsions: No  Neurologic: Headache: Yes Seizure: No Paresthesias: No  Medical history. Patient has history of hypertension , migraine headache and GERD.    Outpatient Encounter Prescriptions as of 11/10/2015  Medication Sig  . clindamycin (CLEOCIN) 300 MG capsule Take 1 capsule (300 mg total) by mouth 4 (four) times daily. For 7 days  . fluconazole (DIFLUCAN) 150 MG tablet Take 1 tablet (150 mg total) by mouth once.  Marland Kitchen FLUoxetine (PROZAC) 40 MG capsule Take 1 capsule (40 mg total) by mouth daily.  Marland Kitchen ibuprofen (ADVIL,MOTRIN) 600 MG tablet Take 1 tablet (600 mg total) by mouth every 6 (six) hours as needed for moderate pain.  Marland Kitchen LORazepam (ATIVAN) 1 MG tablet Take 1 tablet (1 mg total) by mouth at bedtime as needed for anxiety.  . Multiple Vitamin (MULTIVITAMIN) tablet Take 1 tablet by mouth daily.  Marland Kitchen OLANZapine (ZYPREXA) 15 MG tablet Take 1 tablet (15 mg total) by mouth at bedtime.  Marland Kitchen omeprazole (PRILOSEC) 40 MG capsule take 1 capsule by mouth twice a day  .  topiramate (TOPAMAX) 25 MG tablet Take 2 tablets (50 mg total) by mouth at bedtime.  . traZODone (DESYREL) 100 MG tablet Take 1 tablet (100 mg total) by mouth at bedtime.  . valACYclovir (VALTREX) 500 MG tablet Take 1 tablet (500 mg total) by mouth daily as needed (break outs).  . [DISCONTINUED] FLUoxetine (PROZAC) 40 MG capsule Take 1 capsule (40 mg total) by mouth daily.  . [DISCONTINUED] LORazepam (ATIVAN) 1 MG tablet  Take 1 tablet (1 mg total) by mouth at bedtime as needed for anxiety.  . [DISCONTINUED] OLANZapine (ZYPREXA) 10 MG tablet Take 1 tablet (10 mg total) by mouth at bedtime.  . [DISCONTINUED] topiramate (TOPAMAX) 25 MG tablet Take 2 tablets (50 mg total) by mouth at bedtime.  . [DISCONTINUED] traZODone (DESYREL) 100 MG tablet Take 1 tablet (100 mg total) by mouth at bedtime.   No facility-administered encounter medications on file as of 11/10/2015.    Past Psychiatric History/Hospitalization(s): Patient has history of bipolar disorder, alcohol dependence and marijuana abuse.  She has seen in this office on and off since 2008.  She is at lease 6 psychiatric hospitalization.  She's been admitted in behavioral [place] in October 2008.  In the past she had tried lithium, Seroquel, Lamictal, Paxil, Celexa, Depakote, Neurontin and Cymbalta with limited response.   Anxiety: Yes Bipolar Disorder: Yes Depression: Yes Mania: Yes Psychosis: Yes Schizophrenia: No Personality Disorder: No Hospitalization for psychiatric illness: Yes History of Electroconvulsive Shock Therapy: No Prior Suicide Attempts: No  Physical Exam: Constitutional:  BP 152/82 mmHg  Pulse 79  Ht 5' 5.5" (1.664 m)  Wt 172 lb 12.8 oz (78.382 kg)  BMI 28.31 kg/m2  Recent Results (from the past 2160 hour(s))  CBC with Differential/Platelet     Status: None   Collection Time: 09/08/15 11:14 AM  Result Value Ref Range   WBC 7.6 4.0 - 10.5 K/uL   RBC 4.22 3.87 - 5.11 MIL/uL   Hemoglobin 12.6 12.0 - 15.0 g/dL   HCT 36.5 36.0 - 46.0 %   MCV 86.5 78.0 - 100.0 fL   MCH 29.9 26.0 - 34.0 pg   MCHC 34.5 30.0 - 36.0 g/dL   RDW 14.8 11.5 - 15.5 %   Platelets 321 150 - 400 K/uL   MPV 9.6 8.6 - 12.4 fL   Neutrophils Relative % 56 43 - 77 %   Neutro Abs 4.3 1.7 - 7.7 K/uL   Lymphocytes Relative 34 12 - 46 %   Lymphs Abs 2.6 0.7 - 4.0 K/uL   Monocytes Relative 8 3 - 12 %   Monocytes Absolute 0.6 0.1 - 1.0 K/uL   Eosinophils  Relative 2 0 - 5 %   Eosinophils Absolute 0.2 0.0 - 0.7 K/uL   Basophils Relative 0 0 - 1 %   Basophils Absolute 0.0 0.0 - 0.1 K/uL   Smear Review Criteria for review not met   COMPLETE METABOLIC PANEL WITH GFR     Status: Abnormal   Collection Time: 09/08/15 11:14 AM  Result Value Ref Range   Sodium 142 135 - 146 mmol/L   Potassium 3.1 (L) 3.5 - 5.3 mmol/L   Chloride 107 98 - 110 mmol/L   CO2 26 20 - 31 mmol/L   Glucose, Bld 85 65 - 99 mg/dL   BUN 5 (L) 7 - 25 mg/dL   Creat 0.72 0.50 - 1.10 mg/dL   Total Bilirubin 0.4 0.2 - 1.2 mg/dL   Alkaline Phosphatase 49 33 - 115 U/L  AST 16 10 - 30 U/L   ALT 9 6 - 29 U/L   Total Protein 6.5 6.1 - 8.1 g/dL   Albumin 3.9 3.6 - 5.1 g/dL   Calcium 9.5 8.6 - 10.2 mg/dL   GFR, Est African American >89 >=60 mL/min   GFR, Est Non African American >89 >=60 mL/min    Comment:   The estimated GFR is a calculation valid for adults (>=44 years old) that uses the CKD-EPI algorithm to adjust for age and sex. It is   not to be used for children, pregnant women, hospitalized patients,    patients on dialysis, or with rapidly changing kidney function. According to the NKDEP, eGFR >89 is normal, 60-89 shows mild impairment, 30-59 shows moderate impairment, 15-29 shows severe impairment and <15 is ESRD.     HIV antibody     Status: None   Collection Time: 09/08/15 11:14 AM  Result Value Ref Range   HIV 1&2 Ab, 4th Generation NONREACTIVE NONREACTIVE    Comment:   HIV-1 antigen and HIV-1/HIV-2 antibodies were not detected.  There is no laboratory evidence of HIV infection.   HIV-1/2 Antibody Diff        Not indicated. HIV-1 RNA, Qual TMA          Not indicated.     PLEASE NOTE: This information has been disclosed to you from records whose confidentiality may be protected by state law. If your state requires such protection, then the state law prohibits you from making any further disclosure of the information without the specific written consent  of the person to whom it pertains, or as otherwise permitted by law. A general authorization for the release of medical or other information is NOT sufficient for this purpose.   The performance of this assay has not been clinically validated in patients less than 33 years old.   For additional information please refer to http://education.quest[employer].com/faq/FAQ106.  (This link is being provided for informational/educational purposes only.)     POCT SEDIMENTATION RATE     Status: Abnormal   Collection Time: 09/08/15 12:00 PM  Result Value Ref Range   POCT SED RATE 29 (A) 0 - 22 mm/hr   General Appearance: well nourished,   Musculoskeletal: Strength & Muscle Tone: within normal limits Gait & Station: normal Patient leans: N/A  Mental status examination Patient is casually dressed and fairly groomed.  She appeared anxious and tired.  She maintained fair eye contact. Her speech is slow , spontaneous, clear and coherent  She describes her mood depressed and tired.  Her affect is constricted.  She has no tremors or shakes.  She denies any auditory or visual hallucination.  She has no paranoia or any obsessive thoughts.  She denies any active or passive suicidal thoughts or homicidal thoughts.  Her fund of knowledge is adequate.  Her memory is intact.  Her psychomotor activity is normal.  She has no tremors or shakes.  She is alert and oriented x3.  Her insight judgment and impulse control is okay.  Established Problem, Stable/Improving (1), New problem, with additional work up planned, Review of Psycho-Social Stressors (1), Established Problem, Worsening (2), Review of Last Therapy Session (1) and Review of Medication Regimen & Side Effects (2)  Assessment: Axis I: Bipolar disorder, NOS  Axis II: Deferred  Axis III:  Patient Active Problem List   Diagnosis Date Noted  . Hidradenitis suppurativa of left axilla 11/03/2015  . Loss of weight 09/08/2015  . Frequent urination  12/06/2014  .  Genital warts 05/20/2014  . GERD (gastroesophageal reflux disease) 12/26/2012  . Tobacco abuse 08/22/2012  . Esophageal dysphagia 10/28/2011  . HELICOBACTER PYLORI GASTRITIS, HX OF 03/20/2009  . HTN (hypertension) 03/19/2009  . HSV 02/03/2007  . DISORDER, BIPOLAR NOS 02/03/2007    Plan:  Patient is physically stressed out and she is having  severe mood swing and anger issues.  She is also concerned about her physical health and recently seen primary care physician.  Discuss psychosocial stressors, current medication and recent blood work results.  I recommended to increase Zyprexa 15 mg at bedtime.  Discuss side effects especially weight gain but also encouraged to do to go exercise and watching calories intake.  Continue Ativan 1 mg as needed for severe panic attacks.  Continue Prozac 40 mg daily.  She like to go back on Zyprexa 10 mg once she is able to find work.  Discuss safety concern that anytime having active suicidal thoughts or homicidal thoughts then she need to call 911 or go to the local emergency room.  Follow-up in 3 months.  Patient will call us if she decided to come for counseling.  ARFEEN,SYED T., MD 11/10/2015

## 2015-11-17 ENCOUNTER — Other Ambulatory Visit (HOSPITAL_COMMUNITY): Payer: Self-pay | Admitting: Psychiatry

## 2015-11-17 NOTE — Telephone Encounter (Signed)
dose increased on her last visit.  Refill inappropriate.

## 2015-11-19 DIAGNOSIS — L732 Hidradenitis suppurativa: Secondary | ICD-10-CM | POA: Diagnosis not present

## 2015-12-04 ENCOUNTER — Ambulatory Visit: Payer: Self-pay | Admitting: Family Medicine

## 2015-12-23 ENCOUNTER — Ambulatory Visit (HOSPITAL_COMMUNITY): Payer: Self-pay | Admitting: Clinical

## 2015-12-24 ENCOUNTER — Encounter: Payer: Self-pay | Admitting: Family Medicine

## 2015-12-24 ENCOUNTER — Ambulatory Visit (INDEPENDENT_AMBULATORY_CARE_PROVIDER_SITE_OTHER): Payer: Medicare Other | Admitting: Family Medicine

## 2015-12-24 VITALS — BP 117/66 | HR 85 | Temp 98.8°F | Ht 65.5 in | Wt 170.4 lb

## 2015-12-24 DIAGNOSIS — Z0000 Encounter for general adult medical examination without abnormal findings: Secondary | ICD-10-CM | POA: Diagnosis not present

## 2015-12-24 DIAGNOSIS — R634 Abnormal weight loss: Secondary | ICD-10-CM | POA: Diagnosis not present

## 2015-12-24 DIAGNOSIS — Z23 Encounter for immunization: Secondary | ICD-10-CM | POA: Diagnosis not present

## 2015-12-24 DIAGNOSIS — Z8679 Personal history of other diseases of the circulatory system: Secondary | ICD-10-CM

## 2015-12-24 LAB — TSH: TSH: 0.5 m[IU]/L

## 2015-12-24 NOTE — Assessment & Plan Note (Signed)
BP well controlled Not on any medications Continue to monitor  

## 2015-12-24 NOTE — Progress Notes (Signed)
   Subjective:   Sandra Miller is a 40 y.o. female with a history of h/o HTN, GERD, bipolar disorder NOS here for weight loss and BP  BP f/u - used to take propranolol, HCTZ, lisinopril - stopped ~3 yrs ago when she stopped drinking alcohol - not currently taking any antihypertensives - deneis CP, SOB, vision changes, LE edema  Decreased appetite - drinks coke all day - steady weight loss over last 1.5 years - 193 to 170 - stopped drinking alcohol 3 years ago - not trying to lose weight - no appetite - will eat q2-3 days when she feels like she may pass out - wonders if she is depressed - feels apathetic, doesn't feel sad about brother-in-laws death, does admit to social isolation - deneis SI/HI - lost job recently - psych is concerned because she has gained weight on zyprexa in the past and now loosing - taking prozac, ativan, zyprexa, trazodone for bipolar disorder NOS - feels like hair is thinning, no skin changes - tends to stay constipated - occasional palpitations - denies N/V/D, abd pain, blood in stool   Review of Systems:  Per HPI.   Social History: current smoker  Objective:  BP 117/66 mmHg  Pulse 85  Temp(Src) 98.8 F (37.1 C) (Oral)  Ht 5' 5.5" (1.664 m)  Wt 170 lb 6.4 oz (77.293 kg)  BMI 27.91 kg/m2  Gen:  40 y.o. female in NAD HEENT: NCAT, MMM, EOMI, PERRL, anicteric sclerae Neck: Supple, no LAD, no thyromegaly CV: RRR, no MRG Resp: Non-labored, CTAB, no wheezes noted Abd: Soft, NTND, BS present, no guarding or organomegaly Ext: WWP, no edema MSK: No obvious deformity Neuro: Alert and oriented, speech normal      Chemistry      Component Value Date/Time   NA 142 09/08/2015 1114   K 3.1* 09/08/2015 1114   CL 107 09/08/2015 1114   CO2 26 09/08/2015 1114   BUN 5* 09/08/2015 1114   CREATININE 0.72 09/08/2015 1114   CREATININE 1.00 02/26/2015 1444      Component Value Date/Time   CALCIUM 9.5 09/08/2015 1114   ALKPHOS 49 09/08/2015 1114   AST  16 09/08/2015 1114   ALT 9 09/08/2015 1114   BILITOT 0.4 09/08/2015 1114      Lab Results  Component Value Date   WBC 7.6 09/08/2015   HGB 12.6 09/08/2015   HCT 36.5 09/08/2015   MCV 86.5 09/08/2015   PLT 321 09/08/2015   Lab Results  Component Value Date   TSH 0.429 07/25/2014   Lab Results  Component Value Date   HGBA1C 5.7 12/05/2014   Assessment & Plan:     Sandra Miller is a 40 y.o. female here for  H/O: HTN (hypertension) BP well controlled Not on any medications Continue to monitor  Loss of weight Patient with 20 lb unintentional weight loss over 1.5 yrs Labs benign recently Check TSH Suspect related to psych meds and bipolar disorder with some depressive symptoms continue to monitor     Virginia Crews, MD MPH PGY-2,  [facility] 12/24/2015  3:21 PM

## 2015-12-24 NOTE — Assessment & Plan Note (Signed)
Patient with 20 lb unintentional weight loss over 1.5 yrs Labs benign recently Check TSH Suspect related to psych meds and bipolar disorder with some depressive symptoms continue to monitor

## 2015-12-24 NOTE — Patient Instructions (Signed)
Low-Sodium Eating Plan °Sodium raises blood pressure and causes water to be held in the body. Getting less sodium from food will help lower your blood pressure, reduce any swelling, and protect your heart, liver, and kidneys. We get sodium by adding salt (sodium chloride) to food. Most of our sodium comes from canned, boxed, and frozen foods. Restaurant foods, fast foods, and pizza are also very high in sodium. Even if you take medicine to lower your blood pressure or to reduce fluid in your body, getting less sodium from your food is important. °WHAT IS MY PLAN? °Most people should limit their sodium intake to 2,300 mg a day. Your health care provider recommends that you limit your sodium intake to __________ a day.  °WHAT DO I NEED TO KNOW ABOUT THIS EATING PLAN? °For the low-sodium eating plan, you will follow these general guidelines: °· Choose foods with a % Daily Value for sodium of less than 5% (as listed on the food label).   °· Use salt-free seasonings or herbs instead of table salt or sea salt.   °· Check with your health care provider or pharmacist before using salt substitutes.   °· Eat fresh foods. °· Eat more vegetables and fruits. °· Limit canned vegetables. If you do use them, rinse them well to decrease the sodium.   °· Limit cheese to 1 oz (28 g) per day.    °· Eat lower-sodium products, often labeled as "lower sodium" or "no salt added." °· Avoid foods that contain monosodium glutamate (MSG). MSG is sometimes added to Chinese food and some canned foods.   °· Check food labels (Nutrition Facts labels) on foods to learn how much sodium is in one serving. °· Eat more home-cooked food and less restaurant, buffet, and fast food.  °· When eating at a restaurant, ask that your food be prepared with less salt, or no salt if possible.   °HOW DO I READ FOOD LABELS FOR SODIUM INFORMATION? °The Nutrition Facts label lists the amount of sodium in one serving of the food. If you eat more than one serving, you  must multiply the listed amount of sodium by the number of servings. °Food labels may also identify foods as: °· Sodium free--Less than 5 mg in a serving. °· Very low sodium--35 mg or less in a serving. °· Low sodium--140 mg or less in a serving. °· Light in sodium--50% less sodium in a serving. For example, if a food that usually has 300 mg of sodium is changed to become light in sodium, it will have 150 mg of sodium. °· Reduced sodium--25% less sodium in a serving. For example, if a food that usually has 400 mg of sodium is changed to reduced sodium, it will have 300 mg of sodium. °WHAT FOODS CAN I EAT? °Grains  °Low-sodium cereals, including oats, puffed wheat and rice, and shredded wheat cereals. Low-sodium crackers. Unsalted rice and pasta. Lower-sodium bread.  °Vegetables  °Frozen or fresh vegetables. Low-sodium or reduced-sodium canned vegetables. Low-sodium or reduced-sodium tomato sauce and paste. Low-sodium or reduced-sodium tomato and vegetable juices.  °Fruits  °Fresh, frozen, and canned fruit. Fruit juice.  °Meat and Other Protein Products  °Low-sodium canned tuna and salmon. Fresh or frozen meat, poultry, seafood, and fish. Lamb. Unsalted nuts. Dried beans, peas, and lentils without added salt. Unsalted canned beans. Homemade soups without salt. Eggs.  °Dairy  °Milk. Soy milk. Ricotta cheese. Low-sodium or reduced-sodium cheeses. Yogurt.  °Condiments  °Fresh and dried herbs and spices. Salt-free seasonings. Onion and garlic powders. Low-sodium varieties of mustard and ketchup. Fresh or refrigerated horseradish. Lemon   juice.  °Fats and Oils   °Reduced-sodium salad dressings. Unsalted butter.   °Other  °Unsalted popcorn and pretzels.  °The items listed above may not be a complete list of recommended foods or beverages. Contact your dietitian for more options. °WHAT FOODS ARE NOT RECOMMENDED? °Grains  °Instant hot cereals. Bread stuffing, pancake, and biscuit mixes. Croutons. Seasoned rice or pasta mixes.  Noodle soup cups. Boxed or frozen macaroni and cheese. Self-rising flour. Regular salted crackers. °Vegetables  °Regular canned vegetables. Regular canned tomato sauce and paste. Regular tomato and vegetable juices. Frozen vegetables in sauces. Salted French fries. Olives. Pickles. Relishes. Sauerkraut. Salsa. °Meat and Other Protein Products  °Salted, canned, smoked, spiced, or pickled meats, seafood, or fish. Bacon, ham, sausage, hot dogs, corned beef, chipped beef, and packaged luncheon meats. Salt pork. Jerky. Pickled herring. Anchovies, regular canned tuna, and sardines. Salted nuts. °Dairy  °Processed cheese and cheese spreads. Cheese curds. Blue cheese and cottage cheese. Buttermilk.  °Condiments  °Onion and garlic salt, seasoned salt, table salt, and sea salt. Canned and packaged gravies. Worcestershire sauce. Tartar sauce. Barbecue sauce. Teriyaki sauce. Soy sauce, including reduced sodium. Steak sauce. Fish sauce. Oyster sauce. Cocktail sauce. Horseradish that you find on the shelf. Regular ketchup and mustard. Meat flavorings and tenderizers. Bouillon cubes. Hot sauce. Tabasco sauce. Marinades. Taco seasonings. Relishes. °Fats and Oils   °Regular salad dressings. Salted butter. Margarine. Ghee. Bacon fat.  °Other  °Potato and tortilla chips. Corn chips and puffs. Salted popcorn and pretzels. Canned or dried soups. Pizza. Frozen entrees and pot pies.   °The items listed above may not be a complete list of foods and beverages to avoid. Contact your dietitian for more information. °  °This information is not intended to replace advice given to you by your health care provider. Make sure you discuss any questions you have with your health care provider. °  °Document Released: 02/11/2002 Document Revised: 09/12/2014 Document Reviewed: 06/26/2013 °Elsevier Interactive Patient Education ©2016 [employer] ° °

## 2015-12-25 ENCOUNTER — Encounter: Payer: Self-pay | Admitting: Family Medicine

## 2016-01-12 ENCOUNTER — Ambulatory Visit (INDEPENDENT_AMBULATORY_CARE_PROVIDER_SITE_OTHER): Payer: Medicare Other | Admitting: Clinical

## 2016-01-12 ENCOUNTER — Encounter (HOSPITAL_COMMUNITY): Payer: Self-pay | Admitting: Clinical

## 2016-01-12 DIAGNOSIS — F429 Obsessive-compulsive disorder, unspecified: Secondary | ICD-10-CM

## 2016-01-12 DIAGNOSIS — F3162 Bipolar disorder, current episode mixed, moderate: Secondary | ICD-10-CM

## 2016-01-12 DIAGNOSIS — F4310 Post-traumatic stress disorder, unspecified: Secondary | ICD-10-CM | POA: Diagnosis not present
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
## 2016-02-02 ENCOUNTER — Ambulatory Visit (INDEPENDENT_AMBULATORY_CARE_PROVIDER_SITE_OTHER): Payer: Medicare Other | Admitting: Clinical

## 2016-02-02 DIAGNOSIS — F429 Obsessive-compulsive disorder, unspecified: Secondary | ICD-10-CM | POA: Diagnosis not present

## 2016-02-02 DIAGNOSIS — F3162 Bipolar disorder, current episode mixed, moderate: Secondary | ICD-10-CM

## 2016-02-02 DIAGNOSIS — F4310 Post-traumatic stress disorder, unspecified: Secondary | ICD-10-CM

## 2016-02-04 ENCOUNTER — Encounter (HOSPITAL_COMMUNITY): Payer: Self-pay | Admitting: Clinical

## 2016-02-04 NOTE — Progress Notes (Signed)
   THERAPIST PROGRESS NOTE  Session Time: 11:00 -11:56  Participation Level: Active  Behavioral Response: CasualAlertDepressed  Type of Therapy: Individual Therapy  Treatment Goals addressed: reduce psychiatric symptoms, elevate mood , improve unhelpful thought patterns, discuss and process trauma (without being overwhelmed)    Interventions: CBT and Motivational Interviewing, grounding and mindfulness techniques, psychoeducation  Summary: Zyiah Withington is a 40 y.o. female who presents with Bipolar I, mixed, moderate, and PTSD, and OCD  Suicidal/Homicidal: No -without intent/plan  Therapist Response: Kynzley met with clinician for an individual session. Lataya discussed her psychiatric symptoms, her current life events and her goals for therapy. Imane shared that for the past two weeks she has been extreemly depressed. She shared that she has been having a difficult time getting out of bed and she has a difficult time eating and has been eating a very limited diet. Clinician asked open ended questions. Shonique shared a bout having the depression and the negative thoughts. Sharmin shared that some of the negative thoughts were about her past trauma. Moncia had an emotional response to her limited sharing. Clinician introduced grounding and mindfulness techniques. Clinician explained the process purpose and practice of the techniques. Clanging clinician practice some of the techniques together. Dimonique shared that she felt better after doing the techniques. Clinician explained how these techniques could be used to help her regulate her emotions and interrupt unhelpful thought patterns. Novalynn agreed to practice techniques twice daily and tell next session. Clinician presented a bipolar packet 1 in 2 and Zaylia agreed to complete them and bring them back to next session.   Plan: Return again in 1-2 weeks.  Diagnosis: Axis I: Bipolar 1 , mixed, moderate, and PTSD, and OCD  Jareth Pardee  A, LCSW 02/04/2016

## 2016-02-11 ENCOUNTER — Ambulatory Visit (INDEPENDENT_AMBULATORY_CARE_PROVIDER_SITE_OTHER): Payer: Medicare Other | Admitting: Psychiatry

## 2016-02-11 ENCOUNTER — Encounter (HOSPITAL_COMMUNITY): Payer: Self-pay | Admitting: Psychiatry

## 2016-02-11 VITALS — BP 120/78 | HR 87 | Ht 65.5 in | Wt 170.6 lb

## 2016-02-11 DIAGNOSIS — F319 Bipolar disorder, unspecified: Secondary | ICD-10-CM | POA: Diagnosis not present

## 2016-02-11 MED ORDER — LORAZEPAM 1 MG PO TABS: 1.0000 mg | ORAL_TABLET | Freq: Two times a day (BID) | ORAL | Status: DC

## 2016-02-11 MED ORDER — OLANZAPINE 15 MG PO TABS: 15.0000 mg | ORAL_TABLET | Freq: Every day | ORAL | Status: DC

## 2016-02-11 MED ORDER — TRAZODONE HCL 100 MG PO TABS: 100.0000 mg | ORAL_TABLET | Freq: Every day | ORAL | Status: DC

## 2016-02-11 NOTE — Progress Notes (Signed)
[facility] [phone] Progress Note  GERELENE SEJA [id number] 40 y.o.  02/11/2016 3:37 PM  Chief Complaint  Patient presents with  . Follow-up  . Anxiety  . Fatigue  . Stress    History of Present Illness: Charlesa came for the appointment.  She has been feeling very sad depressed and stressed out.  She has been unable to find more than 20 hours a week and sometimes she works every other week.  She admitted financial strain and she admitted very lonely, isolated and withdrawn.  Most of the time she does not leave the house .  Last week she was thinking to get admitted that she could not find any person who can take care of the dog.  She admitted her because problem is that she is unable to find a job  which keep her busy.  She admitted crying spells, anhedonia, feeling of hopelessness.  However she denies any active or passive suicidal thoughts or homicidal thought.  She is out from the school and that also causes a lot of stress because she has no outlet.  She started seeing [name] and that is helping somewhat.  She denies any aggressive behavior.  On the last visit we increased Zyprexa which helped some of her insomnia.  Patient denies drinking or using any illegal substances.  She is taking Prozac and Ativan along with trazodone.  She has no tremors, shakes or any EPS.  Her appetite is fair.  Her vital signs are stable.  Suicidal Ideation: No Plan Formed: No Patient has means to carry out plan: No  Homicidal Ideation: No Plan Formed: No Patient has means to carry out plan: No  Review of Systems  Constitutional: Positive for weight loss and malaise/fatigue.  Cardiovascular: Negative for chest pain and palpitations.  Gastrointestinal: Negative for heartburn.  Musculoskeletal: Negative.   Neurological: Positive for headaches. Negative for dizziness, tingling and tremors.  Psychiatric/Behavioral: Positive for depression. Negative for suicidal ideas, hallucinations and substance  abuse.    Psychiatric: Agitation: Irritability Hallucination: No Depressed Mood: Yes Insomnia: No Hypersomnia: Yes Altered Concentration: No Feels Worthless: No Grandiose Ideas: No Belief In Special Powers: No New/Increased Substance Abuse: No Compulsions: No  Neurologic: Headache: Yes Seizure: No Paresthesias: No  Medical history. Patient has history of hypertension , migraine headache and GERD.    Outpatient Encounter Prescriptions as of 02/11/2016  Medication Sig  . clindamycin (CLEOCIN) 300 MG capsule Take 1 capsule (300 mg total) by mouth 4 (four) times daily. For 7 days (Patient not taking: Reported on 01/12/2016)  . fluconazole (DIFLUCAN) 150 MG tablet Take 1 tablet (150 mg total) by mouth once. (Patient not taking: Reported on 01/12/2016)  . FLUoxetine (PROZAC) 40 MG capsule Take 1 capsule (40 mg total) by mouth daily.  Marland Kitchen ibuprofen (ADVIL,MOTRIN) 600 MG tablet Take 1 tablet (600 mg total) by mouth every 6 (six) hours as needed for moderate pain. (Patient not taking: Reported on 01/12/2016)  . LORazepam (ATIVAN) 1 MG tablet Take 1 tablet (1 mg total) by mouth 2 (two) times daily.  . Multiple Vitamin (MULTIVITAMIN) tablet Take 1 tablet by mouth daily.  Marland Kitchen OLANZapine (ZYPREXA) 15 MG tablet Take 1 tablet (15 mg total) by mouth at bedtime.  Marland Kitchen omeprazole (PRILOSEC) 40 MG capsule take 1 capsule by mouth twice a day  . topiramate (TOPAMAX) 25 MG tablet Take 2 tablets (50 mg total) by mouth at bedtime.  . traZODone (DESYREL) 100 MG tablet Take 1 tablet (100 mg total) by  mouth at bedtime.  . valACYclovir (VALTREX) 500 MG tablet Take 1 tablet (500 mg total) by mouth daily as needed (break outs).  . [DISCONTINUED] LORazepam (ATIVAN) 1 MG tablet Take 1 tablet (1 mg total) by mouth at bedtime as needed for anxiety.  . [DISCONTINUED] OLANZapine (ZYPREXA) 15 MG tablet Take 1 tablet (15 mg total) by mouth at bedtime.  . [DISCONTINUED] traZODone (DESYREL) 100 MG tablet Take 1 tablet (100 mg  total) by mouth at bedtime.   No facility-administered encounter medications on file as of 02/11/2016.    Past Psychiatric History/Hospitalization(s): Patient has history of bipolar disorder, alcohol dependence and marijuana abuse.  She has seen in this office on and off since 2008.  She is at lease 6 psychiatric hospitalization.  She's been admitted in behavioral [facility] in October 2008.  In the past she had tried lithium, Seroquel, Lamictal, Paxil, Celexa, Depakote, Neurontin and Cymbalta with limited response.   Anxiety: Yes Bipolar Disorder: Yes Depression: Yes Mania: Yes Psychosis: Yes Schizophrenia: No Personality Disorder: No Hospitalization for psychiatric illness: Yes History of Electroconvulsive Shock Therapy: No Prior Suicide Attempts: No  Physical Exam: Constitutional:  BP 120/78 mmHg  Pulse 87  Ht 5' 5.5" (1.664 m)  Wt 170 lb 9.6 oz (77.384 kg)  BMI 27.95 kg/m2  Recent Results (from the past 2160 hour(s))  TSH     Status: None   Collection Time: 12/24/15  2:50 PM  Result Value Ref Range   TSH 0.50 mIU/L    Comment:   Reference Range   > or = 20 Years  0.40-4.50   Pregnancy Range First trimester  0.26-2.66 Second trimester 0.55-2.73 Third trimester  0.43-2.91      General Appearance: well nourished,   Musculoskeletal: Strength & Muscle Tone: within normal limits Gait & Station: normal Patient leans: N/A  Mental status examination Patient is casually dressed and fairly groomed.  She appeared anxious and tired.  She maintained fair eye contact. Her speech is slow , spontaneous, clear and coherent  She describes her mood depressed and tired.  Her affect is constricted.  She has no tremors or shakes.  She denies any auditory or visual hallucination.  She has no paranoia or any obsessive thoughts.  She denies any active or passive suicidal thoughts or homicidal thoughts.  Her fund of knowledge is adequate.  Her memory is intact.  Her psychomotor activity  is normal.  She has no tremors or shakes.  She is alert and oriented x3.  Her insight judgment and impulse control is okay.  Established Problem, Stable/Improving (1), New problem, with additional work up planned, Review of Psycho-Social Stressors (1), Established Problem, Worsening (2), Review of Last Therapy Session (1), Review of Medication Regimen & Side Effects (2) and Review of New Medication or Change in Dosage (2)  Assessment: Axis I: Bipolar disorder, NOS  Axis II: Deferred  Axis III:  Patient Active Problem List   Diagnosis Date Noted  . Healthcare maintenance 12/24/2015  . Hidradenitis suppurativa of left axilla 11/03/2015  . Loss of weight 09/08/2015  . Frequent urination 12/06/2014  . Genital warts 05/20/2014  . GERD (gastroesophageal reflux disease) 12/26/2012  . Tobacco abuse 08/22/2012  . H/O: HTN (hypertension) 03/19/2009  . HSV 02/03/2007  . DISORDER, BIPOLAR NOS 02/03/2007    Plan:  I review her symptoms, psychosocial stressors, current medication.  Recommended to increase Ativan to take twice a day to help anxiety and irritability.  Continue Zyprexa 50 mg at bedtime, Prozac 40  mg daily and Trilafon 100 mg at bedtime.  I offer intensive outpatient program and she agreed.  She will start intensive outpatient program next week . Discuss safety concern that anytime having active suicidal thoughts or homicidal thoughts then she need to call 911 or go to the local emergency room.  Follow-up in 4 weeks when she finished intensive outpatient program.   Mischa Brittingham T., MD 02/11/2016

## 2016-02-14 ENCOUNTER — Other Ambulatory Visit (HOSPITAL_COMMUNITY): Payer: Self-pay | Admitting: Psychiatry

## 2016-02-15 ENCOUNTER — Other Ambulatory Visit (HOSPITAL_COMMUNITY): Payer: Self-pay | Admitting: Psychiatry

## 2016-02-16 ENCOUNTER — Other Ambulatory Visit (HOSPITAL_COMMUNITY): Payer: Medicare Other | Attending: Psychiatry | Admitting: Licensed Clinical Social Worker

## 2016-02-16 ENCOUNTER — Encounter (HOSPITAL_COMMUNITY): Payer: Self-pay | Admitting: Psychiatry

## 2016-02-16 DIAGNOSIS — K219 Gastro-esophageal reflux disease without esophagitis: Secondary | ICD-10-CM | POA: Diagnosis not present

## 2016-02-16 DIAGNOSIS — F17210 Nicotine dependence, cigarettes, uncomplicated: Secondary | ICD-10-CM | POA: Diagnosis not present

## 2016-02-16 DIAGNOSIS — F319 Bipolar disorder, unspecified: Secondary | ICD-10-CM | POA: Diagnosis not present

## 2016-02-16 DIAGNOSIS — F419 Anxiety disorder, unspecified: Secondary | ICD-10-CM | POA: Diagnosis not present

## 2016-02-16 DIAGNOSIS — I10 Essential (primary) hypertension: Secondary | ICD-10-CM | POA: Diagnosis not present

## 2016-02-16 NOTE — Progress Notes (Signed)
    Daily Group Progress Note  Program: IOP   Group Time: 9:15-10:45    Participation Level: Minimal  Behavioral Response: Engaged  Type: Group  Summary: Patient mostly listened on her first day. Therapist asked her if she could relate to the discussion of the group, and she said no. Pt. Met with psychiatrist and case manager.      Nancie Neas, LPC
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
## 2016-02-16 NOTE — Progress Notes (Signed)
Daily Group Progress Note  Program: IOP  Group Time: 10:45-12:00pm  Participation Level: Minimal  Behavioral Response: Appropriate  Type of Therapy:  Psychotherapy/ Group Therapy  Summary of Progress:  Pt participated minimally in a discussion about "The power of No." Pt was able to identify all daily activities required and how exhausting that can be. It was suggested to pt that feeling overwhelmed continuously can lead to burn out. The opportunity to replenish mind, body and spirit can lead to a less stressful life.

## 2016-02-16 NOTE — Progress Notes (Signed)
Psychiatric Initial Adult Assessment   Patient Identification: Sandra Miller MRN:  [id number] Date of Evaluation:  02/16/2016 Referral Source: Dr [name] Schilder Chief Complaint: depression and anxiety  Visit Diagnosis: bipolar disorder, NOS  History of Present Illness:  Sandra Miller has had depression and anxiety for years with mood swings that predominate with anger and irritability.  She has been treated with antidepressants and mood disorder medications with some success.  Recently her stress has increased in that she is no longer working as much as she needs to, is out of school and cannot find a job that school prepared her for, her 75 year old son has a new relationship with a mysterious older woman and that worries her as he has pretty much cut off communication with her.  He also has a hard time keeping a job and that worries her.  She cannot relax, cannot get to sleep, feels tired, no motivation, interest or energy, no appetite with weight loss in spite of taking Zyprexa, no suicidal thoughts however.  She has too much time on her hands and cannot stop worrying about everything.  Anger has increased to the point that she avoids people afraid that she will go off and hurt someone.  She has been sober for 4 years and is proud of that.  She is avoiding her boyfriend for the same reasons.  Associated Signs/Symptoms: Depression Symptoms:  depressed mood, anhedonia, insomnia, fatigue, difficulty concentrating, impaired memory, anxiety, (Hypo) Manic Symptoms:  Irritable Mood, Anxiety Symptoms:  Excessive Worry, Psychotic Symptoms:  none PTSD Symptoms: Negative  Past Psychiatric History: as above  Previous Psychotropic Medications: Yes   Substance Abuse History in the last 12 months:  No.  Consequences of Substance Abuse: Negative  Past Medical History:  Past Medical History  Diagnosis Date  . Anxiety   . Hypertension     history of  . GERD (gastroesophageal reflux disease)   . Hiatal  hernia   . HSV   . DISORDER, BIPOLAR NOS   . ALCOHOLISM     history of  . DISORDER, TOBACCO USE   . SUBSTANCE ABUSE     history of  . CONSTIPATION   . Abdominal pain, unspecified site   . EPIGASTRIC PAIN   . HELICOBACTER PYLORI GASTRITIS, HX OF   . NEPHROLITHIASIS, HX OF   . HX, PERSONAL, GENITAL/OBSTETRIC DISORDER NEC   . Esophageal dysphagia     Past Surgical History  Procedure Laterality Date  . Cesarean section  1995  . Knee arthroscopy      left  . Tubal ligation  1995  . [facility]   . Esophagogastroduodenoscopy    . Colonoscopy      Family Psychiatric History: depression and anxiety in family  Family History:  Family History  Problem Relation Age of Onset  . Stomach cancer Paternal Grandfather   . Diabetes Maternal Grandfather   . Diabetes Paternal Grandmother   . Heart disease Father   . Lung cancer Father   . Alcohol abuse Father   . Heart disease Mother   . Depression Mother   . Anxiety disorder Mother   . Drug abuse Brother   . Alcohol abuse Brother   . Kidney disease Maternal Uncle   . Cirrhosis Cousin     alcoholic  . Anxiety disorder Maternal Aunt   . Depression Maternal Aunt   . Colon polyps Brother   . Drug abuse Brother   . ADD /  ADHD Brother     Social History:   Social History   Social History  . Marital Status: Single    Spouse Name: N/A  . Number of Children: N/A  . Years of Education: N/A   Social History Main Topics  . Smoking status: Current Every Day Smoker -- 1.00 packs/day for 22 years    Types: Cigarettes  . Smokeless tobacco: Never Used     Comment: form given 10-27-11/patches are not covered under insurance  . Alcohol Use: No     Comment: sober for 9 months  . Drug Use: No     Comment: did use marijuana, no longer doing this  . Sexual Activity: Yes    Birth Control/ Protection: None, Condom   Other Topics Concern  . None   Social History Narrative   Former Sales promotion account executive patient.       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    In school at [employer] to become Psychologist, sport and exercise.     Disabled for her bipolar d/o      History of EtOH abuse and THC use.    Additional Social History: has always had anger issues, was even in juvenile detention at times  Allergies:   Allergies  Allergen Reactions  . Aspirin Hives  . Minocycline Hives    Metabolic Disorder Labs: Lab Results  Component Value Date   HGBA1C 5.7 12/05/2014   No results found for: PROLACTIN Lab Results  Component Value Date   CHOL 197 06/13/2008   TRIG 160* 06/13/2008   HDL 42 06/13/2008   CHOLHDL 4.7 Ratio 06/13/2008   VLDL 32 06/13/2008   LDLCALC 123* 06/13/2008     Current Medications: Current Outpatient Prescriptions  Medication Sig Dispense Refill  . clindamycin (CLEOCIN) 300 MG capsule Take 1 capsule (300 mg total) by mouth 4 (four) times daily. For 7 days (Patient not taking: Reported on 01/12/2016) 28 capsule 0  . fluconazole (DIFLUCAN) 150 MG tablet Take 1 tablet (150 mg total) by mouth once. (Patient not taking: Reported on 01/12/2016) 1 tablet 0  . FLUoxetine (PROZAC) 40 MG capsule Take 1 capsule (40 mg total) by mouth daily. 30 capsule 2  . ibuprofen (ADVIL,MOTRIN) 600 MG tablet Take 1 tablet (600 mg total) by mouth every 6 (six) hours as needed for moderate pain. (Patient not taking: Reported on 01/12/2016) 30 tablet 0  . LORazepam (ATIVAN) 1 MG tablet Take 1 tablet (1 mg total) by mouth 2 (two) times daily. 60 tablet 0  . Multiple Vitamin (MULTIVITAMIN) tablet Take 1 tablet by mouth daily.    Marland Kitchen OLANZapine (ZYPREXA) 15 MG tablet Take 1 tablet (15 mg total) by mouth at bedtime. 30 tablet 0  . omeprazole (PRILOSEC) 40 MG capsule take 1 capsule by mouth twice a day 60 capsule 11  . topiramate (TOPAMAX) 25 MG tablet take 2 tablets by mouth at bedtime 60 tablet 2  . traZODone (DESYREL) 100 MG tablet Take 1 tablet (100 mg total) by mouth at bedtime. 30 tablet 0  . valACYclovir (VALTREX) 500  MG tablet Take 1 tablet (500 mg total) by mouth daily as needed (break outs). 30 tablet 0   No current facility-administered medications for this visit.    Neurologic: Headache: Negative Seizure: Negative Paresthesias:Negative  Musculoskeletal: Strength & Muscle Tone: within normal limits Gait & Station: normal Patient leans: N/A  Psychiatric Specialty Exam: ROS  There were no vitals taken for this visit.There is no weight on file to calculate  BMI.  General Appearance: Well Groomed  Eye Contact:  Good  Speech:  Clear and Coherent  Volume:  Normal  Mood:  Depressed  Affect:  Appropriate  Thought Process:  Coherent and Goal Directed  Orientation:  Full (Time, Place, and Person)  Thought Content:  Logical  Suicidal Thoughts:  No  Homicidal Thoughts:  No  Memory:  Immediate;   Good Recent;   Good Remote;   Good  Judgement:  Good  Insight:  Fair  Psychomotor Activity:  Normal  Concentration:  Concentration: Good and Attention Span: Good  Recall:  Good  Fund of Knowledge:Good  Language: Good  Akathisia:  Negative  Handed:  Right  AIMS (if indicated):  0  Assets:  Communication Skills Desire for Improvement Financial Resources/Insurance [place] Talents/Skills Transportation Vocational/Educational  ADL's:  Intact  Cognition: WNL  Sleep:  poor    Treatment Plan Summary: daily group therapy   [name], MD 6/13/20171:17 PM

## 2016-02-17 ENCOUNTER — Other Ambulatory Visit (HOSPITAL_COMMUNITY): Payer: Medicare Other | Admitting: Licensed Clinical Social Worker

## 2016-02-17 DIAGNOSIS — F319 Bipolar disorder, unspecified: Secondary | ICD-10-CM

## 2016-02-17 DIAGNOSIS — F17210 Nicotine dependence, cigarettes, uncomplicated: Secondary | ICD-10-CM | POA: Diagnosis not present

## 2016-02-17 DIAGNOSIS — F419 Anxiety disorder, unspecified: Secondary | ICD-10-CM | POA: Diagnosis not present

## 2016-02-17 DIAGNOSIS — K219 Gastro-esophageal reflux disease without esophagitis: Secondary | ICD-10-CM | POA: Diagnosis not present

## 2016-02-17 DIAGNOSIS — I10 Essential (primary) hypertension: Secondary | ICD-10-CM | POA: Diagnosis not present

## 2016-02-17 MED ORDER — BUPROPION HCL ER (XL) 150 MG PO TB24: 150.0000 mg | ORAL_TABLET | ORAL | Status: DC

## 2016-02-17 NOTE — Progress Notes (Signed)
Daily Group Progress Note  Program: IOP  Group Time: 10:45-12:00pm  Participation Level: Active  Behavioral Response: Appropriate  Type of Therapy:  Psychotherapy  Summary of Progress:  Pt participated in a discussion on positive affirmations. Pt participated in an activity "I am okay" by reading one positive affirmation with explanation of thoughts/feelings surrounding the affirmation read. Pt was challenged to read the affirmation every day to overcome self-sabotaging negative thoughts.

## 2016-02-17 NOTE — Progress Notes (Signed)
    Daily Group Progress Note  Program: IOP  Group Time: 9:15-10:45 Participation Level: minimal Behavioral Response: engaged/disengaged Type: Group Therapy Summary: Patient did not shared in group. Therapist asked Pt. How she was doing and she stated "I am ok". Pt. stated that she preferred to listen instead of talking with the group. She at times can be disengaged.   Nancie Neas, LPC

## 2016-02-17 NOTE — Progress Notes (Signed)
Patient ID: Sandra Miller, female   DOB: 06/07/1976, 40 y.o.   MRN: [id number] In consultation with Dr [name] Schilder, Wellbutrin was the nest option if depression persisted.  Wellbutrin XL `150 mg called in to pharmacy # 30 no refills

## 2016-02-18 ENCOUNTER — Telehealth (HOSPITAL_COMMUNITY): Payer: Self-pay | Admitting: Psychiatry

## 2016-02-18 ENCOUNTER — Other Ambulatory Visit (HOSPITAL_COMMUNITY): Payer: Medicare Other | Admitting: Psychiatry

## 2016-02-19 ENCOUNTER — Other Ambulatory Visit (HOSPITAL_COMMUNITY): Payer: Medicare Other | Admitting: Psychiatry

## 2016-02-19 DIAGNOSIS — I10 Essential (primary) hypertension: Secondary | ICD-10-CM | POA: Diagnosis not present

## 2016-02-19 DIAGNOSIS — F419 Anxiety disorder, unspecified: Secondary | ICD-10-CM | POA: Diagnosis not present

## 2016-02-19 DIAGNOSIS — F319 Bipolar disorder, unspecified: Secondary | ICD-10-CM

## 2016-02-19 DIAGNOSIS — F17210 Nicotine dependence, cigarettes, uncomplicated: Secondary | ICD-10-CM | POA: Diagnosis not present

## 2016-02-19 DIAGNOSIS — K219 Gastro-esophageal reflux disease without esophagitis: Secondary | ICD-10-CM | POA: Diagnosis not present

## 2016-02-19 NOTE — Progress Notes (Signed)
Sandra Miller is a 40 y.o. female patient who has been in [place] since 02-16-16.  Dr. Lovena Le started pt on Wellbutrin 150 mg yesterday.  Pt approached writer this a.m stating that whenever she got it filled; the pharmacist informed her that she shouldn't be taking Prozac and Wellbutrin together.  A:  Writer voiced pt's concern to Dr. Casimiro Needle (in Drs. Lovena Le and Arfeen's absence).  Dr. Casimiro Needle instructed pt to discontinue the Prozac.  Pt had already taken her dose today; therefore she will discontinue beginning tomorrow.  Inform Drs. Lovena Le and Arfeen next week. R:  Pt receptive.        Carlis Abbott, RITA, M.Ed, CNA

## 2016-02-22 ENCOUNTER — Other Ambulatory Visit (HOSPITAL_COMMUNITY): Payer: Medicare Other | Admitting: Licensed Clinical Social Worker

## 2016-02-22 DIAGNOSIS — F319 Bipolar disorder, unspecified: Secondary | ICD-10-CM | POA: Diagnosis not present

## 2016-02-22 DIAGNOSIS — F17210 Nicotine dependence, cigarettes, uncomplicated: Secondary | ICD-10-CM | POA: Diagnosis not present

## 2016-02-22 DIAGNOSIS — F419 Anxiety disorder, unspecified: Secondary | ICD-10-CM | POA: Diagnosis not present

## 2016-02-22 DIAGNOSIS — I10 Essential (primary) hypertension: Secondary | ICD-10-CM | POA: Diagnosis not present

## 2016-02-22 DIAGNOSIS — K219 Gastro-esophageal reflux disease without esophagitis: Secondary | ICD-10-CM | POA: Diagnosis not present

## 2016-02-22 NOTE — Progress Notes (Signed)
    Daily Group Progress Note  Program: IOP    Group Time: 9:15-12:00  Participation Level: active  Behavioral Response: engaged, responsive  Time: Group Therapy  Summary: Patient stated that her depression became worse because her patient was moved to a nursing home. Patient shared that one of her stressors is not having enough hours at work, and problems with short-term memory. She also mentioned a theme of isolating, and being paralyzed at home. Therapist encouraged journaling to help with memory loss, and encouraged accomplishing one thing per day.    Nancie Neas, LPC

## 2016-02-22 NOTE — Progress Notes (Signed)
Daily Group Progress Note  Program: IOP  Group Time: 9:00-10:45am  Participation Level: Active  Behavioral Response: Appropriate  Type of Therapy:  Psychoeducation  Summary of Progress: Pt. was present for presentation by [facility] Pharmacist on medication management.    Group Time: 10:45-12:00pm  Participation Level: Active  Behavioral Response: Appropriate  Type of Therapy:  Group Therapy  Summary of Progress: Pt presented as quiet and minimally engaged in the group process. Pt participated in a discussion about boundaries. Pt's son has moved out of his apt and wants to come live with her. She does not feel that she is stable enough in her recovery to let him move in. However she feels guilty for not letting him move in. Pt was encouraged to work on her own self-care and use her boundaries to deal with obstacles which may impeed her own recovery.

## 2016-02-23 ENCOUNTER — Telehealth (HOSPITAL_COMMUNITY): Payer: Self-pay | Admitting: Psychiatry

## 2016-02-23 ENCOUNTER — Other Ambulatory Visit (HOSPITAL_COMMUNITY): Payer: Medicare Other | Admitting: Psychiatry

## 2016-02-23 NOTE — Telephone Encounter (Signed)
D:  Pt had phoned and left vm that she wouldn't be attending MH-IOP today due to a continued severe headache.  A:  Placed call to pt after consulting with Dr. [name] Schilder.  He instructed writer to tell pt to start back on Prozac and take a small dose of the Wellbutrin (75 mg).  Called and left vm for pt, along with writer's phone number if she has any questions.  Informed Dr. Lovena Le.

## 2016-02-24 ENCOUNTER — Other Ambulatory Visit (HOSPITAL_COMMUNITY): Payer: Medicare Other | Admitting: Psychiatry

## 2016-02-24 DIAGNOSIS — F319 Bipolar disorder, unspecified: Secondary | ICD-10-CM

## 2016-02-24 DIAGNOSIS — F419 Anxiety disorder, unspecified: Secondary | ICD-10-CM | POA: Diagnosis not present

## 2016-02-24 DIAGNOSIS — F17210 Nicotine dependence, cigarettes, uncomplicated: Secondary | ICD-10-CM | POA: Diagnosis not present

## 2016-02-24 DIAGNOSIS — I10 Essential (primary) hypertension: Secondary | ICD-10-CM | POA: Diagnosis not present

## 2016-02-24 DIAGNOSIS — K219 Gastro-esophageal reflux disease without esophagitis: Secondary | ICD-10-CM | POA: Diagnosis not present

## 2016-02-24 NOTE — Progress Notes (Signed)
    Daily Group Progress Note  Program: IOP  Group Time: 9:15-10:45 a.m.  Participation Level: active   Behavioral Response: responsive, disengaged  Type: Group Therapy  Summary: Patient had a major theme of anxiety and worry today. Patient has been experiencing headaches, and she mentioned that part of it was because of stress. Darielle's son lost his job, and she is very worried about his living arrangements and seeking a new job. Patient was very self aware, by sharing that she knew that it would only "make her worse" if he moved in with her. Therapist encouraged her to freely share her worries and her greatest fears about her son. Therapist also shared that some discomfort for her son is good. It will force him to figure things out on his own.  Nancie Neas, LPC

## 2016-02-24 NOTE — Progress Notes (Signed)
Daily Group Progress Note Program: IOP Group Time: 10:45-12:00pm  Participation Level: Active  Behavioral Response: Appropriate  Type of Therapy:  Psychoeducation  Summary of Progress: Pt participated in chair yoga, facilitated by certified yoga instructor Jan Fireman, which focused on breath with movement. As a part of self-care and mindfulness for patients with anxiety and depression, yoga helps train the relaxation response.   Alver Fisher, LCASA

## 2016-02-25 ENCOUNTER — Other Ambulatory Visit (HOSPITAL_COMMUNITY): Payer: Medicare Other | Admitting: Psychiatry

## 2016-02-25 DIAGNOSIS — F319 Bipolar disorder, unspecified: Secondary | ICD-10-CM | POA: Diagnosis not present

## 2016-02-25 DIAGNOSIS — K219 Gastro-esophageal reflux disease without esophagitis: Secondary | ICD-10-CM | POA: Diagnosis not present

## 2016-02-25 DIAGNOSIS — F419 Anxiety disorder, unspecified: Secondary | ICD-10-CM | POA: Diagnosis not present

## 2016-02-25 DIAGNOSIS — I10 Essential (primary) hypertension: Secondary | ICD-10-CM | POA: Diagnosis not present

## 2016-02-25 DIAGNOSIS — F17210 Nicotine dependence, cigarettes, uncomplicated: Secondary | ICD-10-CM | POA: Diagnosis not present

## 2016-02-26 ENCOUNTER — Other Ambulatory Visit (HOSPITAL_COMMUNITY): Payer: Medicare Other | Admitting: Psychiatry

## 2016-02-26 DIAGNOSIS — K219 Gastro-esophageal reflux disease without esophagitis: Secondary | ICD-10-CM | POA: Diagnosis not present

## 2016-02-26 DIAGNOSIS — I10 Essential (primary) hypertension: Secondary | ICD-10-CM | POA: Diagnosis not present

## 2016-02-26 DIAGNOSIS — F17210 Nicotine dependence, cigarettes, uncomplicated: Secondary | ICD-10-CM | POA: Diagnosis not present

## 2016-02-26 DIAGNOSIS — F319 Bipolar disorder, unspecified: Secondary | ICD-10-CM | POA: Diagnosis not present

## 2016-02-26 DIAGNOSIS — F419 Anxiety disorder, unspecified: Secondary | ICD-10-CM | POA: Diagnosis not present

## 2016-02-26 NOTE — Progress Notes (Signed)
    Daily Group Progress Note  Program: IOP  Group Time: 9:00-12:00   Participation Level:  active   Behavioral Response:  engaged   Type of Therapy: group therapy   Summary of Progress: Client was alert and active.  Group discussed needs and understanding what personal needs each individual has.  The client struggled to verbalize her needs, and was encouraged to create a list of needs when she had more time to think about it.  Group discussed self-esteem and how others can impact personal self-esteem if allowed.  Client shared she thought she had "big enough walls" that others could not affect her, but she has realized that other's thoughts, feelings, and actions do impact her.  Client was challenged to create a list of 30 affirmations to read to herself twice daily.  Nancie Neas, LPC

## 2016-02-29 ENCOUNTER — Other Ambulatory Visit (HOSPITAL_COMMUNITY): Payer: Medicare Other | Admitting: Licensed Clinical Social Worker

## 2016-02-29 DIAGNOSIS — K219 Gastro-esophageal reflux disease without esophagitis: Secondary | ICD-10-CM | POA: Diagnosis not present

## 2016-02-29 DIAGNOSIS — F17210 Nicotine dependence, cigarettes, uncomplicated: Secondary | ICD-10-CM | POA: Diagnosis not present

## 2016-02-29 DIAGNOSIS — F319 Bipolar disorder, unspecified: Secondary | ICD-10-CM

## 2016-02-29 DIAGNOSIS — F419 Anxiety disorder, unspecified: Secondary | ICD-10-CM | POA: Diagnosis not present

## 2016-02-29 DIAGNOSIS — I10 Essential (primary) hypertension: Secondary | ICD-10-CM | POA: Diagnosis not present

## 2016-02-29 NOTE — Progress Notes (Signed)
    Daily Group Progress Note  Program: IOP  Group Time: 9:00-10:30  Participation Level: Active  Behavioral Response: Appropriate  Type of Therapy:  Group Therapy  Summary of Progress: Pt. Reported that she was feeling "ok". Pt. Continues to work on setting appropriate boundaries with her adult son and reports feelings of guilt and sadness when she thinks boundaries will cause him discomfort.     Group Time: 10:30-12:00  Participation Level:  Active  Behavioral Response: Appropriate  Type of Therapy: Psycho-education Group  Summary of Progress: Pt. Participated in grief and loss with the Chaplain.  Nancie Neas, LPC

## 2016-02-29 NOTE — Progress Notes (Signed)
Daily Group Progress Note  Program: IOP  Group Time: 9:00-10:45am  Participation Level: Active  Behavioral Response: Appropriate  Type of Therapy:  Psychoeducation  Summary of Progress: Pt participated in a presentation by [facility] pharmacist on medication management.     Daily Group Progress Note  Program: IOP   Group Time: 10:45-12:00pm  Participation Level: Active  Behavioral Response: Appropriate  Type of Therapy:  Psychoeducation  Summary of Progress: Pt participated in a discussion on the Recovery Pie" where sections of the pie are essential for success in mental wellness. Recovery Pie sections: Purpose in life, Fun/recreation, Positive Relationships, Spirituality, Support groups, Self-care, Exercise, Medication adherence. Pt was able to identify where growth is needed in her recovery pie and what she is doing well.     Alver Fisher, LCASA

## 2016-03-01 ENCOUNTER — Other Ambulatory Visit (HOSPITAL_COMMUNITY): Payer: Medicare Other | Admitting: Psychiatry

## 2016-03-02 ENCOUNTER — Other Ambulatory Visit (HOSPITAL_COMMUNITY): Payer: Medicare Other | Admitting: Psychiatry

## 2016-03-03 ENCOUNTER — Other Ambulatory Visit (HOSPITAL_COMMUNITY): Payer: Medicare Other | Admitting: Psychiatry

## 2016-03-03 DIAGNOSIS — I10 Essential (primary) hypertension: Secondary | ICD-10-CM | POA: Diagnosis not present

## 2016-03-03 DIAGNOSIS — F319 Bipolar disorder, unspecified: Secondary | ICD-10-CM

## 2016-03-03 DIAGNOSIS — F17210 Nicotine dependence, cigarettes, uncomplicated: Secondary | ICD-10-CM | POA: Diagnosis not present

## 2016-03-03 DIAGNOSIS — K219 Gastro-esophageal reflux disease without esophagitis: Secondary | ICD-10-CM | POA: Diagnosis not present

## 2016-03-03 DIAGNOSIS — F419 Anxiety disorder, unspecified: Secondary | ICD-10-CM | POA: Diagnosis not present

## 2016-03-03 NOTE — Progress Notes (Signed)
Sandra Miller is a 40 y.o. ,single, employed, [place] American female who is well known to this Probation officer due to previous admissions in [place]. Pt has had depression and anxiety for years with mood swings that predominate with anger and irritability. She has been treated with antidepressants and mood disorder medications with some success. 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 He also has a hard time keeping a job and that worries her. She has too much time on her hands and cannot stop worrying about everything. Anger has increased to the point that she avoids people afraid that she will go off and hurt someone. She has been sober for 4 years and is proud of that. She is avoiding her boyfriend for the same reasons. Hasn't seen him in sixteen days. Also, reports the anniversary of her father's death was on 02-27-2023. He died 31 yrs ago. "It feels like yesterday to me."(Reports she was up till 4 am this morning shopping online.) Patients Currently Reported Symptoms/Problems: anhedonia, isolative Pt requesting discharge today, due to returning to job tomorrow.  "They put me on the schedule to work."  Pt states she is ready for discharge.  Requested not to participate in a d/c ceremony.  "I find that to be very sad."  Reports feeling "fine" whenever she's attending the program, but is depressed and anxious whenever she thinks about her son.  "I feel that I will feel so much better whenever my son gets situated (ie. Housing and employment).  I feel bad that he can't move in with me, but I just can't handle the stress of him living with me."  Pt has been able to set boundaries with family and  friends.  Reports improved appetite, decreased anger/irritability.  Denies SI/HI or A/V hallucinations.  A:  D/C today.  Attempted to encourage pt to share about her being d/c'd, but pt declined.  RTW tomorrow.  F/U with Dr. [name] Schilder on 03-31-16 @ 4:30 pm and Audelia Acton, LCSW on 03-22-16 @ 4:30 pm.  Strongly encouraged [facility] and [facility].  Informed pt about the MH-IOP Aftercare program, which meets every Tuesday 5 pm - 6 pm.  Pt is to call [facility]. If interested.  R:  Pt receptive.        Carlis Abbott, RITA, M.Ed, CNA

## 2016-03-03 NOTE — Patient Instructions (Signed)
Pt completed MH-IOP today.  Will follow up with Audelia Acton, LCSW on 03-22-16 @ 4:30 pm and Dr. [name] Schilder on 03-31-16 @ 4:30 pm.  Encouraged support groups.  Provided pt with [facility] information.  Informed pt about MH-IOP aftercare program (Tuesdays 5-6 pm).  Call [facility] [phone]) if interested.

## 2016-03-04 ENCOUNTER — Other Ambulatory Visit (HOSPITAL_COMMUNITY): Payer: Medicare Other

## 2016-03-04 NOTE — Progress Notes (Signed)
    Daily Group Progress Note  Program: IOP   Time: 9:15-12:00 Participation Level: active Behavioral Response: engaged, responsive Type: Group Therapy Summary: Patient discharged today. Patient has improved, as evidenced by, setting boundaries with her son, and sticking by them for her mental health. Patient also mentioned having anxiety about bed bugs, and slight anxiety with the situation with her son. Therapist stated that part of the work is recognizing and validating our feelings.  Nancie Neas, LPC

## 2016-03-07 ENCOUNTER — Other Ambulatory Visit (HOSPITAL_COMMUNITY): Payer: Medicare Other

## 2016-03-08 ENCOUNTER — Emergency Department (HOSPITAL_COMMUNITY): Payer: Medicare Other

## 2016-03-08 ENCOUNTER — Inpatient Hospital Stay (HOSPITAL_COMMUNITY): Payer: Medicare Other | Admitting: Anesthesiology

## 2016-03-08 ENCOUNTER — Inpatient Hospital Stay (HOSPITAL_COMMUNITY)
Admission: EM | Admit: 2016-03-08 | Discharge: 2016-03-10 | DRG: 872 | Disposition: A | Discharge status: Home / Self Care | Payer: Medicare Other | Attending: Internal Medicine | Admitting: Internal Medicine

## 2016-03-08 ENCOUNTER — Encounter (HOSPITAL_COMMUNITY)
Admission: EM | Disposition: A | Discharge status: Home / Self Care | Payer: Self-pay | Source: Home / Self Care | Attending: Internal Medicine

## 2016-03-08 ENCOUNTER — Encounter (HOSPITAL_COMMUNITY): Payer: Self-pay

## 2016-03-08 DIAGNOSIS — A419 Sepsis, unspecified organism: Secondary | ICD-10-CM | POA: Diagnosis not present

## 2016-03-08 DIAGNOSIS — N938 Other specified abnormal uterine and vaginal bleeding: Secondary | ICD-10-CM | POA: Diagnosis present

## 2016-03-08 DIAGNOSIS — Z6827 Body mass index (BMI) 27.0-27.9, adult: Secondary | ICD-10-CM

## 2016-03-08 DIAGNOSIS — N111 Chronic obstructive pyelonephritis: Secondary | ICD-10-CM | POA: Diagnosis not present

## 2016-03-08 DIAGNOSIS — Z888 Allergy status to other drugs, medicaments and biological substances status: Secondary | ICD-10-CM | POA: Diagnosis not present

## 2016-03-08 DIAGNOSIS — E876 Hypokalemia: Secondary | ICD-10-CM | POA: Diagnosis present

## 2016-03-08 DIAGNOSIS — Z79899 Other long term (current) drug therapy: Secondary | ICD-10-CM | POA: Diagnosis not present

## 2016-03-08 DIAGNOSIS — F319 Bipolar disorder, unspecified: Secondary | ICD-10-CM | POA: Diagnosis not present

## 2016-03-08 DIAGNOSIS — Z87442 Personal history of urinary calculi: Secondary | ICD-10-CM | POA: Diagnosis not present

## 2016-03-08 DIAGNOSIS — I10 Essential (primary) hypertension: Secondary | ICD-10-CM | POA: Diagnosis present

## 2016-03-08 DIAGNOSIS — N132 Hydronephrosis with renal and ureteral calculous obstruction: Secondary | ICD-10-CM | POA: Diagnosis not present

## 2016-03-08 DIAGNOSIS — R509 Fever, unspecified: Secondary | ICD-10-CM | POA: Diagnosis not present

## 2016-03-08 DIAGNOSIS — K449 Diaphragmatic hernia without obstruction or gangrene: Secondary | ICD-10-CM | POA: Diagnosis present

## 2016-03-08 DIAGNOSIS — Z801 Family history of malignant neoplasm of trachea, bronchus and lung: Secondary | ICD-10-CM | POA: Diagnosis not present

## 2016-03-08 DIAGNOSIS — Z886 Allergy status to analgesic agent status: Secondary | ICD-10-CM | POA: Diagnosis not present

## 2016-03-08 DIAGNOSIS — D72829 Elevated white blood cell count, unspecified: Secondary | ICD-10-CM | POA: Diagnosis not present

## 2016-03-08 DIAGNOSIS — Z800 Family history of malignant neoplasm of digestive organs: Secondary | ICD-10-CM | POA: Diagnosis not present

## 2016-03-08 DIAGNOSIS — D509 Iron deficiency anemia, unspecified: Secondary | ICD-10-CM | POA: Diagnosis present

## 2016-03-08 DIAGNOSIS — F419 Anxiety disorder, unspecified: Secondary | ICD-10-CM | POA: Diagnosis not present

## 2016-03-08 DIAGNOSIS — Z833 Family history of diabetes mellitus: Secondary | ICD-10-CM

## 2016-03-08 DIAGNOSIS — N390 Urinary tract infection, site not specified: Secondary | ICD-10-CM | POA: Insufficient documentation

## 2016-03-08 DIAGNOSIS — N200 Calculus of kidney: Secondary | ICD-10-CM

## 2016-03-08 DIAGNOSIS — D649 Anemia, unspecified: Secondary | ICD-10-CM | POA: Diagnosis present

## 2016-03-08 DIAGNOSIS — Z720 Tobacco use: Secondary | ICD-10-CM | POA: Diagnosis present

## 2016-03-08 DIAGNOSIS — N201 Calculus of ureter: Secondary | ICD-10-CM | POA: Diagnosis not present

## 2016-03-08 DIAGNOSIS — R1319 Other dysphagia: Secondary | ICD-10-CM | POA: Diagnosis not present

## 2016-03-08 DIAGNOSIS — Z8249 Family history of ischemic heart disease and other diseases of the circulatory system: Secondary | ICD-10-CM

## 2016-03-08 DIAGNOSIS — F17210 Nicotine dependence, cigarettes, uncomplicated: Secondary | ICD-10-CM | POA: Diagnosis not present

## 2016-03-08 DIAGNOSIS — K219 Gastro-esophageal reflux disease without esophagitis: Secondary | ICD-10-CM | POA: Diagnosis present

## 2016-03-08 HISTORY — PX: CYSTOSCOPY WITH RETROGRADE PYELOGRAM, URETEROSCOPY AND STENT PLACEMENT: SHX5789

## 2016-03-08 LAB — COMPREHENSIVE METABOLIC PANEL WITH GFR
ALT: 17 U/L (ref 14–54)
AST: 18 U/L (ref 15–41)
Albumin: 4.2 g/dL (ref 3.5–5.0)
Alkaline Phosphatase: 46 U/L (ref 38–126)
Anion gap: 10 (ref 5–15)
BUN: 7 mg/dL (ref 6–20)
CO2: 21 mmol/L — ABNORMAL LOW (ref 22–32)
Calcium: 9 mg/dL (ref 8.9–10.3)
Chloride: 105 mmol/L (ref 101–111)
Creatinine, Ser: 0.86 mg/dL (ref 0.44–1.00)
GFR calc Af Amer: >60 mL/min
GFR calc non Af Amer: >60 mL/min
Glucose, Bld: 114 mg/dL — ABNORMAL HIGH (ref 65–99)
Potassium: 2.7 mmol/L — CL (ref 3.5–5.1)
Sodium: 136 mmol/L (ref 135–145)
Total Bilirubin: 1.2 mg/dL (ref 0.3–1.2)
Total Protein: 7.9 g/dL (ref 6.5–8.1)

## 2016-03-08 LAB — CBC
HEMATOCRIT: 33 % — AB (ref 36.0–46.0)
HEMOGLOBIN: 11.3 g/dL — AB (ref 12.0–15.0)
MCH: 29.4 pg (ref 26.0–34.0)
MCHC: 34.2 g/dL (ref 30.0–36.0)
MCV: 85.9 fL (ref 78.0–100.0)
PLATELETS: 350 10*3/uL (ref 150–400)
RBC: 3.84 MIL/uL — ABNORMAL LOW (ref 3.87–5.11)
RDW: 13.4 % (ref 11.5–15.5)
WBC: 15.8 10*3/uL — ABNORMAL HIGH (ref 4.0–10.5)

## 2016-03-08 LAB — URINE MICROSCOPIC-ADD ON

## 2016-03-08 LAB — URINALYSIS, ROUTINE W REFLEX MICROSCOPIC
Bilirubin Urine: NEGATIVE
GLUCOSE, UA: NEGATIVE mg/dL
KETONES UR: NEGATIVE mg/dL
Nitrite: POSITIVE — AB
PROTEIN: NEGATIVE mg/dL
Specific Gravity, Urine: 1.007 (ref 1.005–1.030)
pH: 5.5 (ref 5.0–8.0)

## 2016-03-08 LAB — PROTIME-INR
INR: 1.2 (ref 0.00–1.49)
PROTHROMBIN TIME: 15.4 s — AB (ref 11.6–15.2)

## 2016-03-08 LAB — I-STAT BETA HCG BLOOD, ED (MC, WL, AP ONLY)

## 2016-03-08 LAB — MAGNESIUM: MAGNESIUM: 1.2 mg/dL — AB (ref 1.7–2.4)

## 2016-03-08 LAB — LACTIC ACID, PLASMA: Lactic Acid, Venous: 0.5 mmol/L (ref 0.5–1.9)

## 2016-03-08 LAB — LIPASE, BLOOD: Lipase: 25 U/L (ref 11–51)

## 2016-03-08 LAB — PROCALCITONIN: Procalcitonin: <0.1 ng/mL

## 2016-03-08 LAB — APTT: aPTT: 47 seconds — ABNORMAL HIGH (ref 24–37)

## 2016-03-08 SURGERY — CYSTOURETEROSCOPY, WITH RETROGRADE PYELOGRAM AND STENT INSERTION
Anesthesia: General | Site: Ureter | Laterality: Left

## 2016-03-08 MED ORDER — MAGNESIUM SULFATE IN D5W 1-5 GM/100ML-% IV SOLN
1.0000 g | Freq: Once | INTRAVENOUS | Status: AC
  Administered 2016-03-08: 1 g via INTRAVENOUS
  Filled 2016-03-08: qty 100

## 2016-03-08 MED ORDER — PROMETHAZINE HCL 25 MG/ML IJ SOLN
25.0000 mg | Freq: Once | INTRAMUSCULAR | Status: AC
  Administered 2016-03-08: 25 mg via INTRAVENOUS
  Filled 2016-03-08: qty 1

## 2016-03-08 MED ORDER — ADULT MULTIVITAMIN W/MINERALS CH
1.0000 | ORAL_TABLET | Freq: Every day | ORAL | Status: DC
  Administered 2016-03-09 – 2016-03-10 (×2): 1 via ORAL
  Filled 2016-03-08 (×3): qty 1

## 2016-03-08 MED ORDER — MORPHINE SULFATE (PF) 4 MG/ML IV SOLN
4.0000 mg | Freq: Once | INTRAVENOUS | Status: DC
Start: 2016-03-08 — End: 2016-03-08

## 2016-03-08 MED ORDER — ACETAMINOPHEN 650 MG RE SUPP: 650.0000 mg | Freq: Four times a day (QID) | RECTAL | Status: DC | PRN

## 2016-03-08 MED ORDER — BELLADONNA ALKALOIDS-OPIUM 16.2-60 MG RE SUPP
RECTAL | Status: DC | PRN
  Administered 2016-03-08: 1 via RECTAL

## 2016-03-08 MED ORDER — ACETAMINOPHEN 10 MG/ML IV SOLN
1000.0000 mg | Freq: Once | INTRAVENOUS | Status: AC
  Administered 2016-03-08: 1000 mg via INTRAVENOUS

## 2016-03-08 MED ORDER — POTASSIUM CHLORIDE IN NACL 40-0.9 MEQ/L-% IV SOLN
INTRAVENOUS | Status: AC
  Administered 2016-03-08: 100 mL/h via INTRAVENOUS
  Filled 2016-03-08 (×2): qty 1000

## 2016-03-08 MED ORDER — DEXTROSE 5 % IV SOLN
5.0000 mg/kg | INTRAVENOUS | Status: AC
  Administered 2016-03-08: 380 mg via INTRAVENOUS
  Filled 2016-03-08: qty 9.5

## 2016-03-08 MED ORDER — POLYETHYLENE GLYCOL 3350 17 G PO PACK: 17.0000 g | PACK | Freq: Every day | ORAL | Status: DC | PRN

## 2016-03-08 MED ORDER — LORAZEPAM 1 MG PO TABS
1.0000 mg | ORAL_TABLET | Freq: Two times a day (BID) | ORAL | Status: DC
  Administered 2016-03-08 – 2016-03-10 (×4): 1 mg via ORAL
  Filled 2016-03-08 (×4): qty 1

## 2016-03-08 MED ORDER — MORPHINE SULFATE (PF) 4 MG/ML IV SOLN
4.0000 mg | Freq: Once | INTRAVENOUS | Status: AC
  Administered 2016-03-08: 4 mg via INTRAVENOUS
  Filled 2016-03-08: qty 1

## 2016-03-08 MED ORDER — OLANZAPINE 7.5 MG PO TABS
15.0000 mg | ORAL_TABLET | Freq: Every day | ORAL | Status: DC
  Administered 2016-03-08 – 2016-03-09 (×2): 15 mg via ORAL
  Filled 2016-03-08 (×2): qty 2

## 2016-03-08 MED ORDER — PROPOFOL 10 MG/ML IV BOLUS
INTRAVENOUS | Status: DC | PRN
  Administered 2016-03-08: 150 mg via INTRAVENOUS

## 2016-03-08 MED ORDER — LIDOCAINE HCL (CARDIAC) 20 MG/ML IV SOLN
INTRAVENOUS | Status: AC
  Filled 2016-03-08: qty 5

## 2016-03-08 MED ORDER — MIDAZOLAM HCL 5 MG/5ML IJ SOLN
INTRAMUSCULAR | Status: DC | PRN
  Administered 2016-03-08: 2 mg via INTRAVENOUS

## 2016-03-08 MED ORDER — ONDANSETRON HCL 4 MG/2ML IJ SOLN
INTRAMUSCULAR | Status: DC | PRN
  Administered 2016-03-08: 4 mg via INTRAVENOUS

## 2016-03-08 MED ORDER — SODIUM CHLORIDE 0.9 % IV SOLN
2.0000 g | INTRAVENOUS | Status: AC
  Administered 2016-03-08: 2 g via INTRAVENOUS
  Filled 2016-03-08: qty 2000

## 2016-03-08 MED ORDER — LIDOCAINE HCL (CARDIAC) 20 MG/ML IV SOLN
INTRAVENOUS | Status: DC | PRN
  Administered 2016-03-08: 50 mg via INTRAVENOUS

## 2016-03-08 MED ORDER — IOPAMIDOL (ISOVUE-300) INJECTION 61%
100.0000 mL | Freq: Once | INTRAVENOUS | Status: AC | PRN
  Administered 2016-03-08: 100 mL via INTRAVENOUS

## 2016-03-08 MED ORDER — PROMETHAZINE HCL 25 MG/ML IJ SOLN: 6.2500 mg | INTRAMUSCULAR | Status: DC | PRN

## 2016-03-08 MED ORDER — SODIUM CHLORIDE 0.9 % IV BOLUS (SEPSIS)
1000.0000 mL | Freq: Once | INTRAVENOUS | Status: AC
  Administered 2016-03-08: 1000 mL via INTRAVENOUS

## 2016-03-08 MED ORDER — ACETAMINOPHEN 325 MG PO TABS: 650.0000 mg | ORAL_TABLET | Freq: Four times a day (QID) | ORAL | Status: DC | PRN

## 2016-03-08 MED ORDER — PROPOFOL 10 MG/ML IV BOLUS
INTRAVENOUS | Status: AC
  Filled 2016-03-08: qty 20

## 2016-03-08 MED ORDER — FENTANYL CITRATE (PF) 100 MCG/2ML IJ SOLN: 25.0000 ug | INTRAMUSCULAR | Status: DC | PRN

## 2016-03-08 MED ORDER — HYDROMORPHONE HCL 1 MG/ML IJ SOLN: 0.5000 mg | INTRAMUSCULAR | Status: DC | PRN

## 2016-03-08 MED ORDER — BELLADONNA ALKALOIDS-OPIUM 16.2-60 MG RE SUPP
RECTAL | Status: AC
  Filled 2016-03-08: qty 1

## 2016-03-08 MED ORDER — LACTATED RINGERS IV SOLN
INTRAVENOUS | Status: DC | PRN
  Administered 2016-03-08: 20:00:00 via INTRAVENOUS

## 2016-03-08 MED ORDER — FENTANYL CITRATE (PF) 100 MCG/2ML IJ SOLN
INTRAMUSCULAR | Status: DC | PRN
  Administered 2016-03-08 (×3): 50 ug via INTRAVENOUS

## 2016-03-08 MED ORDER — PANTOPRAZOLE SODIUM 40 MG PO TBEC
40.0000 mg | DELAYED_RELEASE_TABLET | Freq: Every day | ORAL | Status: DC
  Administered 2016-03-09 – 2016-03-10 (×2): 40 mg via ORAL
  Filled 2016-03-08 (×2): qty 1

## 2016-03-08 MED ORDER — BUPROPION HCL ER (XL) 150 MG PO TB24
150.0000 mg | ORAL_TABLET | Freq: Every day | ORAL | Status: DC
  Administered 2016-03-09 – 2016-03-10 (×2): 150 mg via ORAL
  Filled 2016-03-08 (×2): qty 1

## 2016-03-08 MED ORDER — TOPIRAMATE 25 MG PO TABS
50.0000 mg | ORAL_TABLET | Freq: Every day | ORAL | Status: DC
  Administered 2016-03-08 – 2016-03-09 (×2): 50 mg via ORAL
  Filled 2016-03-08 (×2): qty 2

## 2016-03-08 MED ORDER — POTASSIUM CHLORIDE 10 MEQ/100ML IV SOLN
10.0000 meq | Freq: Once | INTRAVENOUS | Status: AC
  Administered 2016-03-08: 10 meq via INTRAVENOUS
  Filled 2016-03-08: qty 100

## 2016-03-08 MED ORDER — PNEUMOCOCCAL VAC POLYVALENT 25 MCG/0.5ML IJ INJ
0.5000 mL | INJECTION | INTRAMUSCULAR | Status: DC
  Filled 2016-03-08 (×2): qty 0.5

## 2016-03-08 MED ORDER — ONDANSETRON HCL 4 MG PO TABS
4.0000 mg | ORAL_TABLET | Freq: Four times a day (QID) | ORAL | Status: DC | PRN
Start: 2016-03-08 — End: 2016-03-10

## 2016-03-08 MED ORDER — DEXTROSE 5 % IV SOLN
2.0000 g | Freq: Two times a day (BID) | INTRAVENOUS | Status: DC
  Administered 2016-03-09 – 2016-03-10 (×4): 2 g via INTRAVENOUS
  Filled 2016-03-08 (×4): qty 2

## 2016-03-08 MED ORDER — DIATRIZOATE MEGLUMINE & SODIUM 66-10 % PO SOLN
15.0000 mL | Freq: Once | ORAL | Status: AC
  Administered 2016-03-08: 16:00:00 via ORAL

## 2016-03-08 MED ORDER — BISACODYL 5 MG PO TBEC: 5.0000 mg | DELAYED_RELEASE_TABLET | Freq: Every day | ORAL | Status: DC | PRN

## 2016-03-08 MED ORDER — FENTANYL CITRATE (PF) 250 MCG/5ML IJ SOLN
INTRAMUSCULAR | Status: AC
  Filled 2016-03-08: qty 5

## 2016-03-08 MED ORDER — SODIUM CHLORIDE 0.9% FLUSH
3.0000 mL | Freq: Two times a day (BID) | INTRAVENOUS | Status: DC
  Administered 2016-03-09: 3 mL via INTRAVENOUS

## 2016-03-08 MED ORDER — ACETAMINOPHEN 10 MG/ML IV SOLN
INTRAVENOUS | Status: AC
  Filled 2016-03-08: qty 100

## 2016-03-08 MED ORDER — ONDANSETRON HCL 4 MG/2ML IJ SOLN
INTRAMUSCULAR | Status: AC
  Filled 2016-03-08: qty 2

## 2016-03-08 MED ORDER — TRAZODONE HCL 100 MG PO TABS
100.0000 mg | ORAL_TABLET | Freq: Every day | ORAL | Status: DC
  Administered 2016-03-08 – 2016-03-09 (×2): 100 mg via ORAL
  Filled 2016-03-08 (×2): qty 1

## 2016-03-08 MED ORDER — SODIUM CHLORIDE 0.9 % IR SOLN
Status: DC | PRN
  Administered 2016-03-08: 3000 mL

## 2016-03-08 MED ORDER — LIDOCAINE HCL 2 % EX GEL
CUTANEOUS | Status: AC
  Filled 2016-03-08: qty 5

## 2016-03-08 MED ORDER — 0.9 % SODIUM CHLORIDE (POUR BTL) OPTIME
TOPICAL | Status: DC | PRN
  Administered 2016-03-08: 1000 mL

## 2016-03-08 MED ORDER — DEXAMETHASONE SODIUM PHOSPHATE 10 MG/ML IJ SOLN
INTRAMUSCULAR | Status: DC | PRN
  Administered 2016-03-08: 5 mg via INTRAVENOUS

## 2016-03-08 MED ORDER — HYDROCODONE-ACETAMINOPHEN 5-325 MG PO TABS
1.0000 | ORAL_TABLET | ORAL | Status: DC | PRN
  Administered 2016-03-09 – 2016-03-10 (×3): 2 via ORAL
  Filled 2016-03-08 (×3): qty 2

## 2016-03-08 MED ORDER — ENOXAPARIN SODIUM 40 MG/0.4ML ~~LOC~~ SOLN
40.0000 mg | SUBCUTANEOUS | Status: DC
  Administered 2016-03-09: 40 mg via SUBCUTANEOUS
  Filled 2016-03-08: qty 0.4

## 2016-03-08 MED ORDER — MEPERIDINE HCL 50 MG/ML IJ SOLN: 6.2500 mg | INTRAMUSCULAR | Status: DC | PRN

## 2016-03-08 MED ORDER — ONDANSETRON HCL 4 MG/2ML IJ SOLN: 4.0000 mg | Freq: Four times a day (QID) | INTRAMUSCULAR | Status: DC | PRN

## 2016-03-08 MED ORDER — IOHEXOL 300 MG/ML  SOLN
INTRAMUSCULAR | Status: DC | PRN
  Administered 2016-03-08: 7 mL

## 2016-03-08 MED ORDER — SUCCINYLCHOLINE CHLORIDE 20 MG/ML IJ SOLN
INTRAMUSCULAR | Status: DC | PRN
  Administered 2016-03-08: 100 mg via INTRAVENOUS

## 2016-03-08 MED ORDER — MIDAZOLAM HCL 2 MG/2ML IJ SOLN
INTRAMUSCULAR | Status: AC
  Filled 2016-03-08: qty 2

## 2016-03-08 SURGICAL SUPPLY — 23 items
BAG URINE DRAINAGE (UROLOGICAL SUPPLIES) ×1 IMPLANT
BAG URO CATCHER STRL LF (MISCELLANEOUS) ×1 IMPLANT
CATH FOLEY 2WAY SLVR  5CC 16FR (CATHETERS) ×1
CATH FOLEY 2WAY SLVR 5CC 16FR (CATHETERS) IMPLANT
CATH INTERMIT  6FR 70CM (CATHETERS) ×1 IMPLANT
CLOTH BEACON ORANGE TIMEOUT ST (SAFETY) ×1 IMPLANT
CONT SPECI 4OZ STER CLIK (MISCELLANEOUS) ×1 IMPLANT
GLOVE BIO SURGEON STRL SZ7.5 (GLOVE) ×1 IMPLANT
GLOVE BIOGEL PI IND STRL 7.0 (GLOVE) IMPLANT
GLOVE BIOGEL PI INDICATOR 7.0 (GLOVE) ×1
GLOVE SURG SS PI 7.0 STRL IVOR (GLOVE) ×1 IMPLANT
GOWN STRL REUS W/TWL LRG LVL3 (GOWN DISPOSABLE) ×2 IMPLANT
GUIDEWIRE ANG ZIPWIRE 038X150 (WIRE) IMPLANT
GUIDEWIRE STR DUAL SENSOR (WIRE) ×1 IMPLANT
HOLDER FOLEY CATH W/STRAP (MISCELLANEOUS) ×1 IMPLANT
IV NS IRRIG 3000ML ARTHROMATIC (IV SOLUTION) ×1 IMPLANT
MANIFOLD NEPTUNE II (INSTRUMENTS) ×1 IMPLANT
NS IRRIG 1000ML POUR BTL (IV SOLUTION) ×1 IMPLANT
PACK CYSTO (CUSTOM PROCEDURE TRAY) ×1 IMPLANT
STENT URET 6FRX26 CONTOUR (STENTS) ×1 IMPLANT
SYRINGE 20CC LL (MISCELLANEOUS) ×1 IMPLANT
TUBING CONNECTING 10 (TUBING) ×1 IMPLANT
WATER STERILE IRR 1000ML POUR (IV SOLUTION) ×1 IMPLANT

## 2016-03-08 NOTE — Anesthesia Postprocedure Evaluation (Signed)
Anesthesia Post Note  Patient: Sandra Miller  Procedure(s) Performed: Procedure(s) (LRB): CYSTOSCOPY WITH RETROGRADE PYELOGRAM, URETEROSCOPY AND STENT PLACEMENT (Left)  Patient location during evaluation: PACU Anesthesia Type: General Level of consciousness: awake and alert Pain management: pain level controlled Vital Signs Assessment: post-procedure vital signs reviewed and stable Respiratory status: spontaneous breathing, nonlabored ventilation, respiratory function stable and patient connected to nasal cannula oxygen Cardiovascular status: blood pressure returned to baseline and stable Postop Assessment: no signs of nausea or vomiting Anesthetic complications: no    Last Vitals:  Filed Vitals:   03/08/16 1810 03/08/16 1931  BP: 130/78 123/80  Pulse: 94 83  Temp:    Resp: 16 16    Last Pain:  Filed Vitals:   03/08/16 1932  PainSc: [place]

## 2016-03-08 NOTE — ED Notes (Signed)
Pt not feeling well since Thursday.  Had no energy.  Pt started having vaginal bleeding on Sunday.  She does not have menstrual periods d/t ablation. Pt with chills starting yesterday and high fevers.  Nausea and vomiting.  Abdominal pain.

## 2016-03-08 NOTE — H&P (Signed)
History and Physical    Sandra Miller [id number] DOB: April 14, 1976 DOA: 03/08/2016  PCP: Lavon Paganini, MD   Patient coming from: Home   Chief Complaint: Fevers, chills, malaise, left flank pain  HPI: Sandra Miller is a 40 y.o. female with medical history significant for bipolar disorder, tobacco abuse, and GERD who presents emergency department with complaints of malaise, fevers, chills, lower abdominal pain, and left flank pain since 03/03/16. Patient reports that her symptoms developed fairly acutely 5 days prior to her presentation and have persisted, even worsening over the ensuing days. She has never experienced similar symptoms previously and has not attempted any interventions for her complaints. Pain is described as constant, severe, crampy, localized to left flank and lower abdominal quadrants, and with no relieving or exacerbating factors identified. Patient denies hematuria, but notes that there had been blood in her panties on 03/06/2016 which she attributed to uterine bleeding, though she has not had a menstrual period in approximately 4 years and had undergone an endometrial ablation.  ED Course: Upon arrival to the ED, patient is found to be febrile to 39.4 C, saturating well on room air, tachycardic to the 120s, and with vitals otherwise stable. CMP features a potassium of 2.7 and bicarbonate of 21. CBC is notable for a leukocytosis to 15,800 and hemoglobin of 11.3 with normal MCV. Urinalysis features many bacteria, large leukocytes, positive nitrites, and too numerous to count WBC. CT of the abdomen and pelvis demonstrates an obstructing 6 mm x 4 mm stone in the proximal left ureter with moderate left sided hydroureteronephrosis and perinephric inflammation. Patient was given a 1 L normal saline bolus, 10 mEq of IV potassium, morphine, Phenergan, and urine was sent for culture. Urology was consulted by the ED physician and patient was treated with empiric ampicillin and  gentamicin prior to successful placement of a left ureteral stent. Patient will be admitted by the hospitalist service to the telemetry unit for ongoing evaluation and management of suspected sepsis with pyelonephritis and obstructing left ureteral stone, in addition to critical hypokalemia.  Review of Systems:  All other systems reviewed and apart from HPI, are negative.  Past Medical History  Diagnosis Date  . Anxiety   . Hypertension     history of  . GERD (gastroesophageal reflux disease)   . Hiatal hernia   . HSV   . DISORDER, BIPOLAR NOS   . ALCOHOLISM     history of  . DISORDER, TOBACCO USE   . SUBSTANCE ABUSE     history of  . CONSTIPATION   . Abdominal pain, unspecified site   . EPIGASTRIC PAIN   . HELICOBACTER PYLORI GASTRITIS, HX OF   . NEPHROLITHIASIS, HX OF   . HX, PERSONAL, GENITAL/OBSTETRIC DISORDER NEC   . Esophageal dysphagia     Past Surgical History  Procedure Laterality Date  . Cesarean section  1995  . Knee arthroscopy      left  . Tubal ligation  1995  . [facility]   . Esophagogastroduodenoscopy    . Colonoscopy       reports that she has been smoking Cigarettes.  She has a 22 pack-year smoking history. She has never used smokeless tobacco. She reports that she does not drink alcohol or use illicit drugs.  Allergies  Allergen Reactions  . Aspirin Hives  . Minocycline Hives    Family History  Problem Relation Age of Onset  . Stomach cancer Paternal Grandfather   .  Diabetes Maternal Grandfather   . Diabetes Paternal Grandmother   . Heart disease Father   . Lung cancer Father   . Alcohol abuse Father   . Heart disease Mother   . Depression Mother   . Anxiety disorder Mother   . Drug abuse Brother   . Alcohol abuse Brother   . Kidney disease Maternal Uncle   . Cirrhosis Cousin     alcoholic  . Anxiety disorder Maternal Aunt   . Depression Maternal Aunt   . Colon polyps Brother   . Drug abuse Brother     . ADD / ADHD Brother      Prior to Admission medications   Medication Sig Start Date End Date Taking? Authorizing Provider  acetaminophen (TYLENOL) 500 MG tablet Take 1,000 mg by mouth every 6 (six) hours as needed for moderate pain or fever.   Yes Historical Provider, MD  buPROPion (WELLBUTRIN XL) 150 MG 24 hr tablet Take 1 tablet (150 mg total) by mouth every morning. 02/17/16 02/16/17 Yes Clarene Reamer, MD  LORazepam (ATIVAN) 1 MG tablet Take 1 tablet (1 mg total) by mouth 2 (two) times daily. 02/11/16  Yes Kathlee Nations, MD  Multiple Vitamin (MULTIVITAMIN) tablet Take 1 tablet by mouth daily.   Yes Historical Provider, MD  OLANZapine (ZYPREXA) 15 MG tablet Take 1 tablet (15 mg total) by mouth at bedtime. 02/11/16 02/10/17 Yes Kathlee Nations, MD  omeprazole (PRILOSEC) 40 MG capsule take 1 capsule by mouth twice a day 07/20/15  Yes Gatha Mayer, MD  topiramate (TOPAMAX) 25 MG tablet take 2 tablets by mouth at bedtime 02/15/16  Yes Kathlee Nations, MD  traZODone (DESYREL) 100 MG tablet Take 1 tablet (100 mg total) by mouth at bedtime. 02/11/16  Yes Kathlee Nations, MD  clindamycin (CLEOCIN) 300 MG capsule Take 1 capsule (300 mg total) by mouth 4 (four) times daily. For 7 days Patient not taking: Reported on 03/08/2016 11/03/15   Olin Hauser, DO  fluconazole (DIFLUCAN) 150 MG tablet Take 1 tablet (150 mg total) by mouth once. Patient not taking: Reported on 03/08/2016 11/06/15   Virginia Crews, MD  ibuprofen (ADVIL,MOTRIN) 600 MG tablet Take 1 tablet (600 mg total) by mouth every 6 (six) hours as needed for moderate pain. Patient not taking: Reported on 03/08/2016 11/03/15   Olin Hauser, DO  valACYclovir (VALTREX) 500 MG tablet Take 1 tablet (500 mg total) by mouth daily as needed (break outs). 04/26/15   Virginia Crews, MD    Physical Exam: Filed Vitals:   03/08/16 2030 03/08/16 2045 03/08/16 2056 03/08/16 2110  BP: 116/61 108/67  123/72  Pulse: 92 91  87  Temp:   99 F  (37.2 C) 99.4 F (37.4 C)  TempSrc:      Resp: 28 19  14   Height:      Weight:      SpO2: 99% 95%  96%      Constitutional: NAD, calm, comfortable Eyes: PERTLA, lids and conjunctivae normal ENMT: Mucous membranes are moist. Posterior pharynx clear of any exudate or lesions.   Neck: normal, supple, no masses, no thyromegaly Respiratory: clear to auscultation bilaterally, no wheezing, no crackles. Normal respiratory effort.   Cardiovascular: S1 & S2 heard, regular rate and rhythm, no significant murmurs. 2+ pedal pulses. No carotid bruits. No significant JVD. Abdomen: No distension, mild tenderness across lower abd quadrants, no masses palpated. Bowel sounds normal.  Musculoskeletal: no clubbing / cyanosis. No joint  deformity upper and lower extremities. Normal muscle tone.  Skin: no significant rashes, lesions, ulcers. Warm, dry, well-perfused. Neurologic: CN 2-12 grossly intact. Sensation intact, DTR normal. Strength 5/5 in all 4 limbs.  Psychiatric: Normal judgment and insight. Alert and oriented x 3. Normal mood and affect.     Labs on Admission: I have personally reviewed following labs and imaging studies  CBC:  Recent Labs Lab 03/08/16 1516  WBC 15.8*  HGB 11.3*  HCT 33.0*  MCV 85.9  PLT [id number]   Basic Metabolic Panel:  Recent Labs Lab 03/08/16 1516  NA 136  K 2.7*  CL 105  CO2 21*  GLUCOSE 114*  BUN 7  CREATININE 0.86  CALCIUM 9.0   GFR: Estimated Creatinine Clearance: 87 mL/min (by C-G formula based on Cr of 0.86). Liver Function Tests:  Recent Labs Lab 03/08/16 1516  AST 18  ALT 17  ALKPHOS 46  BILITOT 1.2  PROT 7.9  ALBUMIN 4.2    Recent Labs Lab 03/08/16 1516  LIPASE 25   No results for input(s): AMMONIA in the last 168 hours. Coagulation Profile: No results for input(s): INR, PROTIME in the last 168 hours. Cardiac Enzymes: No results for input(s): CKTOTAL, CKMB, CKMBINDEX, TROPONINI in the last 168 hours. BNP (last 3 results) No  results for input(s): PROBNP in the last 8760 hours. HbA1C: No results for input(s): HGBA1C in the last 72 hours. CBG: No results for input(s): GLUCAP in the last 168 hours. Lipid Profile: No results for input(s): CHOL, HDL, LDLCALC, TRIG, CHOLHDL, LDLDIRECT in the last 72 hours. Thyroid Function Tests: No results for input(s): TSH, T4TOTAL, FREET4, T3FREE, THYROIDAB in the last 72 hours. Anemia Panel: No results for input(s): VITAMINB12, FOLATE, FERRITIN, TIBC, IRON, RETICCTPCT in the last 72 hours. Urine analysis:    Component Value Date/Time   COLORURINE YELLOW 03/08/2016 1550   APPEARANCEUR CLOUDY* 03/08/2016 1550   LABSPEC 1.007 03/08/2016 1550   PHURINE 5.5 03/08/2016 1550   GLUCOSEU NEGATIVE 03/08/2016 1550   HGBUR SMALL* 03/08/2016 1550   BILIRUBINUR NEGATIVE 03/08/2016 1550   BILIRUBINUR NEG 12/05/2014 1342   KETONESUR NEGATIVE 03/08/2016 1550   PROTEINUR NEGATIVE 03/08/2016 1550   PROTEINUR NEG 12/05/2014 1342   UROBILINOGEN 0.2 12/05/2014 1342   UROBILINOGEN 0.2 11/25/2013 1641   NITRITE POSITIVE* 03/08/2016 1550   NITRITE NEG 12/05/2014 1342   LEUKOCYTESUR LARGE* 03/08/2016 1550   Sepsis Labs: @LABRCNTIP (procalcitonin:4,lacticidven:4) )No results found for this or any previous visit (from the past 240 hour(s)).   Radiological Exams on Admission: Ct Abdomen Pelvis W Contrast  03/08/2016  CLINICAL DATA:  Vaginal bleeding on Sunday, patient does not have menstrual periods d/t ablation. Chills starting yesterday and high fevers. Nausea and vomiting. Abdominal pain. EXAM: CT ABDOMEN AND PELVIS WITH CONTRAST TECHNIQUE: Multidetector CT imaging of the abdomen and pelvis was performed using the standard protocol following bolus administration of intravenous contrast. CONTRAST:  119mL ISOVUE-300 IOPAMIDOL (ISOVUE-300) INJECTION 61% COMPARISON:  CT abdomen dated 11/04/2008 FINDINGS: Lower chest:  No acute findings. Hepatobiliary: 7 mm hypodense focus within the left liver lobe,  too small to definitively characterize, favored to be a small cyst. Liver otherwise unremarkable. Gallbladder appears normal. No bile duct dilatation. Pancreas: No mass, inflammatory changes, or other significant abnormality. Spleen: Within normal limits in size and appearance. Adrenals/Urinary Tract: There is an obstructing 6 x 4 mm stone within the proximal left ureter causing moderate left-sided hydronephrosis and perinephric inflammation. There is also dilatation of the proximal left ureter with associated periureteral  inflammation. Probable small cyst within the posterior cortex of the left kidney. Right kidney appears normal without stone or hydronephrosis. Bladder is unremarkable. Stomach/Bowel: Bowel is normal in caliber. No bowel wall thickening or evidence of bowel wall inflammation seen. Appendix is not seen but there are no inflammatory changes about the cecum to suggest acute appendicitis. Stomach appears normal. Vascular/Lymphatic: Abdominal aorta is normal in caliber. Scattered small and moderate-sized lymph nodes in the left retroperitoneum are likely reactive in nature. No enlarged lymph nodes seen within the intraperitoneal abdomen or pelvis. Reproductive: Uterus and adnexal structures are unremarkable. Other: No intraperitoneal free fluid. No abscess collection. No free intraperitoneal air. Musculoskeletal: Probable chronic unilateral pars interarticularis defect on the left at L5-S1. Prominent disc desiccation at L5-S1, at least moderate in degree with disc space narrowing, osseous spurring and vacuum disc phenomenon. Remainder of the disc spaces appear well preserved. No acute or suspicious osseous finding. Superficial soft tissues are unremarkable. IMPRESSION: 1. Obstructing 6 x 4 mm stone within the proximal left ureter causing moderate left-sided hydronephrosis and perinephric inflammation. Superimposed infection/pyelonephritis cannot be excluded, especially concerning given the history of  chills and fevers. 2. Chronic/incidental findings detailed above. Electronically Signed   By: Franki Cabot M.D.   On: 03/08/2016 17:31    EKG: Not performed, will obtain as appropriate   Assessment/Plan  1. Left-sided pyelonephritis with obstructing ureteral calculus and ?sepsis   - Urology consulting and much appreciated; left ureteral stent placed 03/08/16; stone will need definitive management in outpatient setting once infection resolved  - Presents with temp 39.4 C, HR 120, WBC 15,800 and UTI; lactic acid pending  - CT abd/pel with 6x4 mm obstructing stone in proximal left ureter with perinephric inflammation; stent placed to left ureter by Dr. Pilar Jarvis on 03/08/16  - Urine culture is incubating, blood cultures requested  - Empiric ampicillin and gentamicin administered in ED, will continue empiric treatment with cefepime 2g q12h while awaiting culture data  - Trend lactate, PCT, clinical course    2. Hypokalemia  - Serum potassium 2.7 on admission, suspected secondary to GI losses in setting of N/V  - 10 mEq IV potassium given in ED, 40 mEq added to IVF  - Check magnesium level and replace prn  - Monitor on telemetry  - Repeat chem panel in am   3. Normocytic anemia  - Hgb 11.3 on admission, previously normal  - Pt reports discovery of blood in panties 7/2 and 7/3, which she attributes to uterine source as below  - Check iron studies, B12, and folate; supplement prn   4. Bipolar disorder  - Stable, pt denies SI, HI, or hallucinations - Continue current management with Zyprexa, Topamax, Wellbutrin, trazodone, and prn Ativan   5. GERD - Stable, managed with daily Prilosec at home  - Continue PPI therapy with Protonix while in hospital    6. Dysfunctional uterine bleeding  - Pt noted blood in underwear on 7/2 and 7/3  - She is s/p ablation and has not menstruated in 4 yrs  - Uterus and adnexa appear unremarkable on CT  - Can consider further eval with pelvic US; can likely be  done outpatient    DVT prophylaxis: sq Lovenox   Code Status: Full  Family Communication: Discussed with patient  Disposition Plan: Admit to telemetry  Consults called: Urology  Admission status: Inpatient    Vianne Bulls, MD [facility] Pager [phone]  If 7PM-7AM, please contact night-coverage [email or web address] Password [email or web address]  03/08/2016, 9:19 PM

## 2016-03-08 NOTE — ED Provider Notes (Signed)
com[place]ains of left flank pain radiating to the suprapubic area onset 5 days ago accompanied by vomiting 5 or 6 times since onset of pain. She's treated self with Phenergan at home. On exam alert nontoxic lungs clear auscultation heart regular rate and rhythm abdomen nontender. Positive left flank tenderness.  Orlie Dakin, MD 03/09/16 [id number]

## 2016-03-08 NOTE — Consult Note (Signed)
6:56 PM   Sandra Miller 1976-02-20 [id number]  Referring provider: Dr. Orlie Dakin  Chief Complaint  Patient presents with  . Emesis  . Vaginal Bleeding    HPI: The patient is a 40 year old female with a past medical history of nephrolithiasis who presents with concerns for sepsis and an obstructing left ureteral stone. She has not been feeling well for the last few days. She is been febrile up to 103. She also notes vaginal bleeding which is strange for her as she has not had a period of 4 years. CT shows a 6 Milner stone approximately ureter. Her urinalysis is concerning for infection. She has leukocytosis and she is febrile.  She has a history of one stone prior to this. She passed spontaneously.   PMH: Past Medical History  Diagnosis Date  . Anxiety   . Hypertension     history of  . GERD (gastroesophageal reflux disease)   . Hiatal hernia   . HSV   . DISORDER, BIPOLAR NOS   . ALCOHOLISM     history of  . DISORDER, TOBACCO USE   . SUBSTANCE ABUSE     history of  . CONSTIPATION   . Abdominal pain, unspecified site   . EPIGASTRIC PAIN   . HELICOBACTER PYLORI GASTRITIS, HX OF   . NEPHROLITHIASIS, HX OF   . HX, PERSONAL, GENITAL/OBSTETRIC DISORDER NEC   . Esophageal dysphagia     Surgical History: Past Surgical History  Procedure Laterality Date  . Cesarean section  1995  . Knee arthroscopy      left  . Tubal ligation  1995  . [facility]   . Esophagogast[facility]odenoscopy    . Colonoscopy      Home Medications:    Medication List    ASK your doctor about these medications        acetaminophen 500 MG tablet  Commonly known as:  TYLENOL  Take 1,000 mg by mouth every 6 (six) hours as needed for moderate pain or fever.     buPROPion 150 MG 24 hr tablet  Commonly known as:  WELLBUTRIN XL  Take 1 tablet (150 mg total) by mouth every morning.     clindamycin 300 MG capsule  Commonly known as:  CLEOCIN  Take 1 capsule  (300 mg total) by mouth 4 (four) times daily. For 7 days     fluconazole 150 MG tablet  Commonly known as:  DIFLUCAN  Take 1 tablet (150 mg total) by mouth once.     ibuprofen 600 MG tablet  Commonly known as:  ADVIL,MOTRIN  Take 1 tablet (600 mg total) by mouth every 6 (six) hours as needed for moderate pain.     LORazepam 1 MG tablet  Commonly known as:  ATIVAN  Take 1 tablet (1 mg total) by mouth 2 (two) times daily.     multivitamin tablet  Take 1 tablet by mouth daily.     OLANZapine 15 MG tablet  Commonly known as:  ZYPREXA  Take 1 tablet (15 mg total) by mouth at bedtime.     omeprazole 40 MG capsule  Commonly known as:  PRILOSEC  take 1 capsule by mouth twice a day     topiramate 25 MG tablet  Commonly known as:  TOPAMAX  take 2 tablets by mouth at bedtime     traZODone 100 MG tablet  Commonly known as:  DESYREL  Take 1 tablet (100 mg total) by mouth  at bedtime.     valACYclovir 500 MG tablet  Commonly known as:  VALTREX  Take 1 tablet (500 mg total) by mouth daily as needed (break outs).        Allergies:  Allergies  Allergen Reactions  . Aspirin Hives  . Minocycline Hives    Family History: Family History  Problem Relation Age of Onset  . Stomach cancer Paternal Grandfather   . Diabetes Maternal Grandfather   . Diabetes Paternal Grandmother   . Heart disease Father   . Lung cancer Father   . Alcohol abuse Father   . Heart disease Mother   . Depression Mother   . Anxiety disorder Mother   . Drug abuse Brother   . Alcohol abuse Brother   . Kidney disease Maternal Uncle   . Cirrhosis Cousin     alcoholic  . Anxiety disorder Maternal Aunt   . Depression Maternal Aunt   . Colon polyps Brother   . Drug abuse Brother   . ADD / ADHD Brother     Social History:  reports that she has been smoking Cigarettes.  She has a 22 pack-year smoking history. She has never used smokeless tobacco. She reports that she does not drink alcohol or use illicit  drugs.  ROS: 12 point ROS otherwise negative except for HPI                                        Physical Exam: BP 130/78 mmHg  Pulse 94  Temp(Src) 101.5 F (38.6 C) (Oral)  Resp 16  Ht 5\' 5"  (1.651 m)  Wt 168 lb (76.204 kg)  BMI 27.96 kg/m2  SpO2 100%  LMP   Constitutional:  Alert and oriented, No acute distress. HEENT: [place] AT, moist mucus membranes.  Trachea midline, no masses. Cardiovascular: No clubbing, cyanosis, or edema. Respiratory: Normal respiratory effort, no increased work of breathing. GI: Abdomen is soft, nontender, nondistended, no abdominal masses GU: No CVA tenderness.  Skin: No rashes, bruises or suspicious lesions. Lymph: No cervical or inguinal adenopathy. Neurologic: Grossly intact, no focal deficits, moving all 4 extremities. Psychiatric: Normal mood and affect.  Laboratory Data: Lab Results  Component Value Date   WBC 15.8* 03/08/2016   HGB 11.3* 03/08/2016   HCT 33.0* 03/08/2016   MCV 85.9 03/08/2016   PLT 350 03/08/2016    Lab Results  Component Value Date   CREATININE 0.86 03/08/2016    No results found for: PSA  No results found for: TESTOSTERONE  Lab Results  Component Value Date   HGBA1C 5.7 12/05/2014    Urinalysis    Component Value Date/Time   COLORURINE YELLOW 03/08/2016 1550   APPEARANCEUR CLOUDY* 03/08/2016 1550   LABSPEC 1.007 03/08/2016 1550   PHURINE 5.5 03/08/2016 1550   GLUCOSEU NEGATIVE 03/08/2016 1550   HGBUR SMALL* 03/08/2016 1550   BILIRUBINUR NEGATIVE 03/08/2016 1550   BILIRUBINUR NEG 12/05/2014 1342   KETONESUR NEGATIVE 03/08/2016 1550   PROTEINUR NEGATIVE 03/08/2016 1550   PROTEINUR NEG 12/05/2014 1342   UROBILINOGEN 0.2 12/05/2014 1342   UROBILINOGEN 0.2 11/25/2013 1641   NITRITE POSITIVE* 03/08/2016 1550   NITRITE NEG 12/05/2014 1342   LEUKOCYTESUR LARGE* 03/08/2016 1550    Pertinent Imaging: CLINICAL DATA: Vaginal bleeding on Sunday, patient does not have menstrual  periods d/t ablation. Chills starting yesterday and high fevers. Nausea and vomiting. Abdominal pain.  EXAM: CT ABDOMEN AND  PELVIS WITH CONTRAST  TECHNIQUE: Multidetector CT imaging of the abdomen and pelvis was performed using the standard protocol following bolus administration of intravenous contrast.  CONTRAST: 155mL ISOVUE-300 IOPAMIDOL (ISOVUE-300) INJECTION 61%  COMPARISON: CT abdomen dated 11/04/2008  FINDINGS: Lower chest: No acute findings.  Hepatobiliary: 7 mm hypodense focus within the left liver lobe, too small to definitively characterize, favored to be a small cyst. Liver otherwise unremarkable. Gallbladder appears normal. No bile duct dilatation.  Pancreas: No mass, inflammatory changes, or other significant abnormality.  Spleen: Within normal limits in size and appearance.  Adrenals/Urinary Tract: There is an obstructing 6 x 4 mm stone within the proximal left ureter causing moderate left-sided hydronephrosis and perinephric inflammation. There is also dilatation of the proximal left ureter with associated periureteral inflammation. Probable small cyst within the posterior cortex of the left kidney. Right kidney appears normal without stone or hydronephrosis. Bladder is unremarkable.  Stomach/Bowel: Bowel is normal in caliber. No bowel wall thickening or evidence of bowel wall inflammation seen. Appendix is not seen but there are no inflammatory changes about the cecum to suggest acute appendicitis. Stomach appears normal.  Vascular/Lymphatic: Abdominal aorta is normal in caliber. Scattered small and moderate-sized lymph nodes in the left retroperitoneum are likely reactive in nature. No enlarged lymph nodes seen within the intraperitoneal abdomen or pelvis.  Reproductive: Uterus and adnexal structures are unremarkable.  Other: No intraperitoneal free fluid. No abscess collection. No free intraperitoneal air.  Musculoskeletal:  Probable chronic unilateral pars interarticularis defect on the left at L5-S1. Prominent disc desiccation at L5-S1, at least moderate in degree with disc space narrowing, osseous spurring and vacuum disc phenomenon. Remainder of the disc spaces appear well preserved. No acute or suspicious osseous finding. Superficial soft tissues are unremarkable.  IMPRESSION: 1. Obstructing 6 x 4 mm stone within the proximal left ureter causing moderate left-sided hydronephrosis and perinephric inflammation. Superimposed infection/pyelonephritis cannot be excluded, especially concerning given the history of chills and fevers. 2. Chronic/incidental findings detailed above.  Assessment & Plan:    1. Left ureteral stone 2. UTI 3. Probable sepsis I discussed the patient the role for cystoscopy with emergent left ureteral stent placement due to her left renal stone with UTI and signs of sepsis. We discussed the goal today is to unobstruct her left collecting system. We will not attempt to the manipulate the stone as it could make her symptoms worse. She understands the risks, benefits, and indications of this procedure. She understands the risks include but are not limited to bleeding, infection, need for repeat procedures, need for interventional radiology placement of nephrostomy tube along others. All questions were answered. She is agreeable to proceeding.  Nickie Retort, MD

## 2016-03-08 NOTE — ED Provider Notes (Signed)
CSN: [id number]     Arrival date & time 03/08/16  1434 History   None    Chief Complaint  Patient presents with  . Emesis  . Vaginal Bleeding   HPI  Sandra Miller is a 40 y.o. female PMH significant for hiatal hernia, GERD, anxiety, bipolar disorder, substance abuse, epigastric pain, Helicobacter pylori gastritis, esophageal dysphasia, nephrolithiasis, hypertension presenting with multiple complaints since Thursday. She states she has had decreased energy, decreased appetite intermittently. She endorses only drinking Cokes over the last few days. She states she began having vaginal bleeding on Sunday and Monday which has since resolved but this is concerning to her because she had an ablation 4 years ago. She endorses fevers of 102 at home since last night. She had one episode of vomiting yesterday which was bilious, nonbloody in nature. She endorses abdominal pain which she describes as suprapubic and left flank location, nonradiating, constant, dissimilar to previous kidney stones she has had in the past, 7/10 pain scale, no alleviation attempts. She denies chest pain, shortness of breath, ill contacts, raw/undercooked foods, recent travel, vaginal discharge/odor/itching.  Past Medical History  Diagnosis Date  . Anxiety   . Hypertension     history of  . GERD (gastroesophageal reflux disease)   . Hiatal hernia   . HSV   . DISORDER, BIPOLAR NOS   . ALCOHOLISM     history of  . DISORDER, TOBACCO USE   . SUBSTANCE ABUSE     history of  . CONSTIPATION   . Abdominal pain, unspecified site   . EPIGASTRIC PAIN   . HELICOBACTER PYLORI GASTRITIS, HX OF   . NEPHROLITHIASIS, HX OF   . HX, PERSONAL, GENITAL/OBSTETRIC DISORDER NEC   . Esophageal dysphagia    Past Surgical History  Procedure Laterality Date  . Cesarean section  1995  . Knee arthroscopy      left  . Tubal ligation  1995  . [facility]   . Esophagogastroduodenoscopy    . Colonoscopy      Family History  Problem Relation Age of Onset  . Stomach cancer Paternal Grandfather   . Diabetes Maternal Grandfather   . Diabetes Paternal Grandmother   . Heart disease Father   . Lung cancer Father   . Alcohol abuse Father   . Heart disease Mother   . Depression Mother   . Anxiety disorder Mother   . Drug abuse Brother   . Alcohol abuse Brother   . Kidney disease Maternal Uncle   . Cirrhosis Cousin     alcoholic  . Anxiety disorder Maternal Aunt   . Depression Maternal Aunt   . Colon polyps Brother   . Drug abuse Brother   . ADD / ADHD Brother    Social History  Substance Use Topics  . Smoking status: Current Every Day Smoker -- 1.00 packs/day for 22 years    Types: Cigarettes  . Smokeless tobacco: Never Used     Comment: form given 10-27-11/patches are not covered under insurance  . Alcohol Use: No     Comment: sober for 9 months   OB History    No data available     Review of Systems  Ten systems are reviewed and are negative for acute change except as noted in the HPI  Allergies  Aspirin and Minocycline  Home Medications   Prior to Admission medications   Medication Sig Start Date End Date Taking? Authorizing Provider  acetaminophen (  TYLENOL) 500 MG tablet Take 1,000 mg by mouth every 6 (six) hours as needed for moderate pain or fever.   Yes Historical Provider, MD  buPROPion (WELLBUTRIN XL) 150 MG 24 hr tablet Take 1 tablet (150 mg total) by mouth every morning. 02/17/16 02/16/17 Yes Clarene Reamer, MD  LORazepam (ATIVAN) 1 MG tablet Take 1 tablet (1 mg total) by mouth 2 (two) times daily. 02/11/16  Yes Kathlee Nations, MD  Multiple Vitamin (MULTIVITAMIN) tablet Take 1 tablet by mouth daily.   Yes Historical Provider, MD  OLANZapine (ZYPREXA) 15 MG tablet Take 1 tablet (15 mg total) by mouth at bedtime. 02/11/16 02/10/17 Yes Kathlee Nations, MD  omeprazole (PRILOSEC) 40 MG capsule take 1 capsule by mouth twice a day 07/20/15  Yes Gatha Mayer, MD  topiramate  (TOPAMAX) 25 MG tablet take 2 tablets by mouth at bedtime 02/15/16  Yes Kathlee Nations, MD  traZODone (DESYREL) 100 MG tablet Take 1 tablet (100 mg total) by mouth at bedtime. 02/11/16  Yes Kathlee Nations, MD  clindamycin (CLEOCIN) 300 MG capsule Take 1 capsule (300 mg total) by mouth 4 (four) times daily. For 7 days Patient not taking: Reported on 03/08/2016 11/03/15   Olin Hauser, DO  fluconazole (DIFLUCAN) 150 MG tablet Take 1 tablet (150 mg total) by mouth once. Patient not taking: Reported on 03/08/2016 11/06/15   Virginia Crews, MD  ibuprofen (ADVIL,MOTRIN) 600 MG tablet Take 1 tablet (600 mg total) by mouth every 6 (six) hours as needed for moderate pain. Patient not taking: Reported on 03/08/2016 11/03/15   Olin Hauser, DO  valACYclovir (VALTREX) 500 MG tablet Take 1 tablet (500 mg total) by mouth daily as needed (break outs). 04/26/15   Virginia Crews, MD   BP 157/103 mmHg  Pulse 120  Temp(Src) 102.9 F (39.4 C) (Oral)  Resp 18  Ht 5\' 5"  (1.651 m)  Wt 76.204 kg  BMI 27.96 kg/m2  SpO2 100%  LMP  Physical Exam  Constitutional: She appears well-developed and well-nourished. No distress.  HENT:  Head: Normocephalic and atraumatic.  Mouth/Throat: Oropharynx is clear and moist. No oropharyngeal exudate.  Eyes: Conjunctivae are normal. Pupils are equal, round, and reactive to light. Right eye exhibits no discharge. Left eye exhibits no discharge. No scleral icterus.  Neck: No tracheal deviation present.  Cardiovascular: Normal rate, regular rhythm, normal heart sounds and intact distal pulses.  Exam reveals no gallop and no friction rub.   No murmur heard. Pulmonary/Chest: Effort normal and breath sounds normal. No respiratory distress. She has no wheezes. She has no rales. She exhibits no tenderness.  Abdominal: Soft. Bowel sounds are normal. She exhibits no distension and no mass. There is tenderness. There is no rebound and no guarding.  Mild, distractible  periumbilical tenderness  Musculoskeletal: She exhibits no edema.  Lymphadenopathy:    She has no cervical adenopathy.  Neurological: She is alert. Coordination normal.  Skin: Skin is warm and dry. No rash noted. She is not diaphoretic. No erythema.  Psychiatric: Her behavior is normal.  Tearful during examination  Nursing note and vitals reviewed.   ED Course  Procedures (including critical care time) Labs Review Labs Reviewed  WET PREP, GENITAL  LIPASE, BLOOD  COMPREHENSIVE METABOLIC PANEL  CBC  URINALYSIS, ROUTINE W REFLEX MICROSCOPIC (NOT AT [facility])  I-STAT BETA HCG BLOOD, ED ([facility], [facility], [facility] ONLY)  POC URINE PREG, ED    Imaging Review Ct Abdomen Pelvis W Contrast  03/08/2016  CLINICAL DATA:  Vaginal bleeding on Sunday, patient does not have menstrual periods d/t ablation. Chills starting yesterday and high fevers. Nausea and vomiting. Abdominal pain. EXAM: CT ABDOMEN AND PELVIS WITH CONTRAST TECHNIQUE: Multidetector CT imaging of the abdomen and pelvis was performed using the standard protocol following bolus administration of intravenous contrast. CONTRAST:  159mL ISOVUE-300 IOPAMIDOL (ISOVUE-300) INJECTION 61% COMPARISON:  CT abdomen dated 11/04/2008 FINDINGS: Lower chest:  No acute findings. Hepatobiliary: 7 mm hypodense focus within the left liver lobe, too small to definitively characterize, favored to be a small cyst. Liver otherwise unremarkable. Gallbladder appears normal. No bile duct dilatation. Pancreas: No mass, inflammatory changes, or other significant abnormality. Spleen: Within normal limits in size and appearance. Adrenals/Urinary Tract: There is an obstructing 6 x 4 mm stone within the proximal left ureter causing moderate left-sided hydronephrosis and perinephric inflammation. There is also dilatation of the proximal left ureter with associated periureteral inflammation. Probable small cyst within the posterior cortex of the left kidney. Right kidney appears normal without  stone or hydronephrosis. Bladder is unremarkable. Stomach/Bowel: Bowel is normal in caliber. No bowel wall thickening or evidence of bowel wall inflammation seen. Appendix is not seen but there are no inflammatory changes about the cecum to suggest acute appendicitis. Stomach appears normal. Vascular/Lymphatic: Abdominal aorta is normal in caliber. Scattered small and moderate-sized lymph nodes in the left retroperitoneum are likely reactive in nature. No enlarged lymph nodes seen within the intraperitoneal abdomen or pelvis. Reproductive: Uterus and adnexal structures are unremarkable. Other: No intraperitoneal free fluid. No abscess collection. No free intraperitoneal air. Musculoskeletal: Probable chronic unilateral pars interarticularis defect on the left at L5-S1. Prominent disc desiccation at L5-S1, at least moderate in degree with disc space narrowing, osseous spurring and vacuum disc phenomenon. Remainder of the disc spaces appear well preserved. No acute or suspicious osseous finding. Superficial soft tissues are unremarkable. IMPRESSION: 1. Obstructing 6 x 4 mm stone within the proximal left ureter causing moderate left-sided hydronephrosis and perinephric inflammation. Superimposed infection/pyelonephritis cannot be excluded, especially concerning given the history of chills and fevers. 2. Chronic/incidental findings detailed above. Electronically Signed   By: Franki Cabot M.D.   On: 03/08/2016 17:31   I have personally reviewed and evaluated these images and lab results as part of my medical decision-making.  MDM   Final diagnoses:  UTI (lower urinary tract infection)  Nephrolithiasis  Hypokalemia   Patient febrile at 102.9. HR 97. CT abdomen/pelvis demonstrates obstructing 6 x 4 mm stone within the proximal left ureter causing moderate left-sided hydronephrosis and perinephric inflammation. Superimposed infection/pyelonephritis cannot be excluded, especially concerning given the history of  chills and fevers. Hypokalemia of 2.7. Leukocytosis of 15.8. Nitrite positive UTI. Urine culture sent. Magnesium of 1.2.   Spoke with Dr. Pilar Jarvis who advises admission to [facility] for stent placement.  Bliss Lions, PA-C 03/20/16 1215  Orlie Dakin, MD 03/21/16 [phone]

## 2016-03-08 NOTE — Transfer of Care (Signed)
Immediate Anesthesia Transfer of Care Note  Patient: Sandra Miller  Procedure(s) Performed: Procedure(s): CYSTOSCOPY WITH RETROGRADE PYELOGRAM, URETEROSCOPY AND STENT PLACEMENT (Left)  Patient Location: PACU  Anesthesia Type:General  Level of Consciousness:  sedated, patient cooperative and responds to stimulation  Airway & Oxygen Therapy:Patient Spontanous Breathing and Patient connected to face mask oxgen  Post-op Assessment:  Report given to PACU RN and Post -op Vital signs reviewed and stable  Post vital signs:  Reviewed and stable  Last Vitals:  Filed Vitals:   03/08/16 1931 03/08/16 2027  BP: 123/80 120/57  Pulse: 83 105  Temp:  37.4 C  Resp: 16 13    Complications: No apparent anesthesia complications

## 2016-03-08 NOTE — Anesthesia Procedure Notes (Signed)
Procedure Name: Intubation Date/Time: 03/08/2016 7:51 PM Performed by: West Pugh Pre-anesthesia Checklist: Patient identified, Emergency Drugs available, Suction available, Patient being monitored and Timeout performed Patient Re-evaluated:Patient Re-evaluated prior to inductionOxygen Delivery Method: Circle system utilized Preoxygenation: Pre-oxygenation with 100% oxygen Intubation Type: IV induction, Cricoid Pressure applied and Rapid sequence Ventilation: Mask ventilation without difficulty Laryngoscope Size: Mac and 4 Grade View: Grade I Tube type: Oral Tube size: 7.5 mm Number of attempts: 1 Airway Equipment and Method: Stylet Placement Confirmation: ETT inserted through vocal cords under direct vision,  positive ETCO2,  CO2 detector and breath sounds checked- equal and bilateral Secured at: 22 cm Tube secured with: Tape Dental Injury: Teeth and Oropharynx as per pre-operative assessment

## 2016-03-08 NOTE — Anesthesia Preprocedure Evaluation (Addendum)
Anesthesia Evaluation  Patient identified by MRN, date of birth, ID band Patient awake    Reviewed: Allergy & Precautions, NPO status , Patient's Chart, lab work & pertinent test results  Airway Mallampati: II   Neck ROM: Full    Dental  (+) Teeth Intact, Dental Advisory Given   Pulmonary neg pulmonary ROS, Current Smoker,    breath sounds clear to auscultation       Cardiovascular hypertension, negative cardio ROS   Rhythm:Regular     Neuro/Psych Anxiety Bipolar Disorder negative neurological ROS  negative psych ROS   GI/Hepatic negative GI ROS, Neg liver ROS, GERD  ,(+)     substance abuse  alcohol use,   Endo/Other  negative endocrine ROS  Renal/GU Acute stone  negative genitourinary   Musculoskeletal negative musculoskeletal ROS (+)   Abdominal   Peds negative pediatric ROS (+)  Hematology negative hematology ROS (+) 11/33,  WBC 15K   Anesthesia Other Findings Potasium 2.7, HCG neg., Potasium 3.1   09/2015  Reproductive/Obstetrics negative OB ROS                           Anesthesia Physical Anesthesia Plan  ASA: II and emergent  Anesthesia Plan: General   Post-op Pain Management:    Induction: Rapid sequence, Cricoid pressure planned and Intravenous  Airway Management Planned: Oral ETT  Additional Equipment:   Intra-op Plan:   Post-operative Plan: Extubation in OR  Informed Consent: I have reviewed the patients History and Physical, chart, labs and discussed the procedure including the risks, benefits and alternatives for the proposed anesthesia with the patient or authorized representative who has indicated his/her understanding and acceptance.     Plan Discussed with:   Anesthesia Plan Comments: (Verify patient receiving Potasium)        Anesthesia Quick Evaluation

## 2016-03-08 NOTE — Op Note (Signed)
Date of procedure: 03/08/2016  Preoperative diagnosis:  1. Left ureteral stone 2. UTI 3. Possible sepsis   Postoperative diagnosis:  1. Left renal stone 2. UTI 3. Possible sepsis   Procedure: 1. Cystoscopy 2. Left retrograde pyelogram with interpretation 3. Left ureteral stent placement 6 French by 26 cm  Surgeon: Baruch Gouty, MD  Anesthesia: General  Complications: None  Intraoperative findings: The patient has significant hydronephrotic drip after placing a open-ended catheter to the level of the renal pelvis. After which are resolved, Lopressor retrograde pyelogram was obtained to identify the collecting system. Left renal stent was in place.  EBL: None  Specimens: Left ureter urine for culture  Drains: 6 French by 26 mL left double-J ureteral stent and 16 French Foley  Disposition: Stable to the postanesthesia care unit  Indication for procedure: The patient is a 40 y.o. female with a left ureteral stone in the setting of possible sepsis and UTI who presents for stent placement for decompression of her collecting system.  After reviewing the management options for treatment, the patient elected to proceed with the above surgical procedure(s). We have discussed the potential benefits and risks of the procedure, side effects of the proposed treatment, the likelihood of the patient achieving the goals of the procedure, and any potential problems that might occur during the procedure or recuperation. Informed consent has been obtained.  Description of procedure: The patient was met in the preoperative area. All risks, benefits, and indications of the procedure were described in great detail. The patient consented to the procedure. Preoperative antibiotics were given. The patient was taken to the operative theater. General anesthesia was induced per the anesthesia service. The patient was then placed in the dorsal lithotomy position and prepped and draped in the usual sterile  fashion. A preoperative timeout was called.   A 21 French 30 cystoscope was inserted into the patient's bladder per urethra atraumatically. Left ureteral orifice was intubated with a sensor wire. This was advanced level of the renal pelvis under fluoroscopy. Open a catheter was exchanged the sensor wire. The hydronephrotic drip was allowed to drain. A low-pressure retrograde polygrams obtained to identify the clotting system. The sensor wire was then exchanged the open-ended catheter. A 6 French by 26 cm double-J ureteral stent was then placed. Sensor wire was removed. The stent was confirmed to be in the correct location with a Kerlix in the patient's renal pelvis on fluoroscopy and direct embolization in the patient's urinary bladder. A 16 French Foley catheter was then placed. The patient was transferred in stable condition to the post anesthesia care unit.  Plan: The patient will be admitted to the hospitalist service for treatment of sepsis. She'll continue her Foley catheter overnight. She will need definitive stone management as an outpatient the future once her infection is resolved.  Baruch Gouty, M.D.

## 2016-03-09 ENCOUNTER — Encounter (HOSPITAL_COMMUNITY): Payer: Self-pay | Admitting: Urology

## 2016-03-09 DIAGNOSIS — N111 Chronic obstructive pyelonephritis: Secondary | ICD-10-CM

## 2016-03-09 DIAGNOSIS — N938 Other specified abnormal uterine and vaginal bleeding: Secondary | ICD-10-CM

## 2016-03-09 DIAGNOSIS — D649 Anemia, unspecified: Secondary | ICD-10-CM

## 2016-03-09 DIAGNOSIS — E876 Hypokalemia: Secondary | ICD-10-CM

## 2016-03-09 DIAGNOSIS — F319 Bipolar disorder, unspecified: Secondary | ICD-10-CM

## 2016-03-09 LAB — MAGNESIUM: Magnesium: 2.1 mg/dL (ref 1.7–2.4)

## 2016-03-09 LAB — LACTIC ACID, PLASMA: Lactic Acid, Venous: 1.7 mmol/L (ref 0.5–1.9)

## 2016-03-09 LAB — IRON AND TIBC
IRON: 11 ug/dL — AB (ref 28–170)
Saturation Ratios: 5 % — ABNORMAL LOW (ref 10.4–31.8)
TIBC: 211 ug/dL — AB (ref 250–450)
UIBC: 200 ug/dL

## 2016-03-09 LAB — BASIC METABOLIC PANEL
ANION GAP: 7 (ref 5–15)
BUN: 6 mg/dL (ref 6–20)
CALCIUM: 8.5 mg/dL — AB (ref 8.9–10.3)
CHLORIDE: 110 mmol/L (ref 101–111)
CO2: 21 mmol/L — AB (ref 22–32)
CREATININE: 0.77 mg/dL (ref 0.44–1.00)
GFR calc non Af Amer: >60 mL/min (ref >60)
Glucose, Bld: 169 mg/dL — ABNORMAL HIGH (ref 65–99)
Potassium: 3.2 mmol/L — ABNORMAL LOW (ref 3.5–5.1)
SODIUM: 138 mmol/L (ref 135–145)

## 2016-03-09 LAB — VITAMIN B12: VITAMIN B 12: 280 pg/mL (ref 180–914)

## 2016-03-09 LAB — FERRITIN: FERRITIN: 189 ng/mL (ref 11–307)

## 2016-03-09 MED ORDER — ENSURE ENLIVE PO LIQD
237.0000 mL | Freq: Two times a day (BID) | ORAL | Status: DC
Start: 2016-03-09 — End: 2016-03-10
  Administered 2016-03-09: 237 mL via ORAL

## 2016-03-09 MED ORDER — MAGNESIUM SULFATE IN D5W 1-5 GM/100ML-% IV SOLN
1.0000 g | Freq: Once | INTRAVENOUS | Status: AC
  Administered 2016-03-09: 1 g via INTRAVENOUS
  Filled 2016-03-09: qty 100

## 2016-03-09 MED ORDER — POTASSIUM CHLORIDE CRYS ER 20 MEQ PO TBCR
40.0000 meq | EXTENDED_RELEASE_TABLET | Freq: Once | ORAL | Status: AC
  Administered 2016-03-09: 40 meq via ORAL
  Filled 2016-03-09: qty 2

## 2016-03-09 NOTE — Progress Notes (Signed)
PROGRESS NOTE    Sandra Miller  [id number] DOB: 1976-04-08 DOA: 03/08/2016 PCP: Lavon Paganini, MD    Brief Narrative:  40 y.o. female with medical history significant for bipolar disorder, tobacco abuse, and GERD who presents emergency department with complaints of malaise, fevers, chills, lower abdominal pain, and left flank pain since 03/03/16. Patient reports that her symptoms developed fairly acutely 5 days prior to her presentation and have persisted, even worsening over the ensuing days. She has never experienced similar symptoms previously and has not attempted any interventions for her complaints. Pain is described as constant, severe, crampy, localized to left flank and lower abdominal quadrants, and with no relieving or exacerbating factors identified. Patient denies hematuria, but notes that there had been blood in her panties on 03/06/2016 which she attributed to uterine bleeding, though she has not had a menstrual period in approximately 4 years and had undergone an endometrial ablation   Assessment & Plan:   Principal Problem:   Obstructive pyelonephritis Active Problems:   DISORDER, BIPOLAR NOS   Tobacco abuse   GERD (gastroesophageal reflux disease)   Nephrolithiasis   Dysfunctional uterine bleeding   Hypokalemia   Normocytic anemia   UTI (lower urinary tract infection)   1. Left-sided pyelonephritis with obstructing ureteral calculus and ?sepsis  - Urology consulting; left ureteral stent placed 03/08/16; stone will need definitive management in outpatient setting once infection resolved  - Presented with temp 39.4 C, HR 120, WBC 15,800 and UTI - CT abd/pel with 6x4 mm obstructing stone in proximal left ureter with perinephric inflammation; stent placed to left ureter by Dr. Pilar Jarvis on 03/08/16  - Urine culture pending - Empiric ampicillin and gentamicin administered in ED, will continue empiric treatment with cefepime 2g q12h while awaiting culture data  2.  Hypokalemia  - Serum potassium 2.7 on admission, suspected secondary to GI losses in setting of N/V  - Replaced   3. Normocytic anemia  - Hgb 11.3 on admission, previously normal  - Pt reports discovery of blood in panties 7/2 and 7/3, which she attributes to uterine source as below  - Check iron studies, B12, and folate; supplement prn   4. Bipolar disorder  - Stable, pt denies SI, HI, or hallucinations - Continue current management with Zyprexa, Topamax, Wellbutrin, trazodone, and prn Ativan   5. GERD - Stable, managed with daily Prilosec at home  - Continue PPI therapy with Protonix while in hospital   6. Dysfunctional uterine bleeding  - Pt noted blood in underwear on 7/2 and 7/3  - She is s/p ablation and has not menstruated in 4 yrs  - Uterus and adnexa appear unremarkable on CT  - Can consider further eval with pelvic US; can likely be done outpatient    DVT prophylaxis: DC Lovenox, start SCDs given recent history of uterine bleeding per above Code Status: Full Family Communication: Patient in room, family not at bedside Disposition Plan: Possible discharge home tomorrow   Consultants:   Urology  Procedures:    Antimicrobials: Anti-infectives    Start     Dose/Rate Route Frequency Ordered Stop   03/09/16 0200  ceFEPIme (MAXIPIME) 2 g in dextrose 5 % 50 mL IVPB     2 g 100 mL/hr over 30 Minutes Intravenous Every 12 hours 03/08/16 2118     03/08/16 1823  [MAR Hold]  gentamicin (GARAMYCIN) 380 mg in dextrose 5 % 50 mL IVPB     (MAR Hold since 03/08/16 1948)   5 mg/kg  76.2 kg 119 mL/hr over 30 Minutes Intravenous 60 min pre-op 03/08/16 1823 03/08/16 1949   03/08/16 1822  [MAR Hold]  ampicillin (OMNIPEN) 2 g in sodium chloride 0.9 % 50 mL IVPB     (MAR Hold since 03/08/16 1948)   2 g 150 mL/hr over 20 Minutes Intravenous 60 min pre-op 03/08/16 1823 03/08/16 1949           Subjective: No complaints this morning.  Objective: Filed Vitals:    03/08/16 2110 03/09/16 0031 03/09/16 0441 03/09/16 1510  BP: 123/72  104/65 117/72  Pulse: 87  57 73  Temp: 99.4 F (37.4 C) 97.7 F (36.5 C) 97.6 F (36.4 C)   TempSrc:  Oral Oral   Resp: 14  16 18   Height:      Weight:      SpO2: 96%  100% 100%    Intake/Output Summary (Last 24 hours) at 03/09/16 1526 Last data filed at 03/09/16 1120  Gross per 24 hour  Intake 1538.33 ml  Output   2000 ml  Net -461.67 ml   Filed Weights   03/08/16 1500  Weight: 76.204 kg (168 lb)    Examination:  General exam: Appears calm and comfortable, Lying in bed  Respiratory system: Clear to auscultation. Respiratory effort normal. Cardiovascular system: S1 & S2 heard, RRR. Gastrointestinal system: Abdomen is nondistended, soft and nontender. No organomegaly or masses felt. Normal bowel sounds heard. Central nervous system: Alert and oriented. No focal neurological deficits. Extremities: Symmetric 5 x 5 power. Skin: No rashes, lesions or ulcers Psychiatry: Judgement and insight appear normal. Mood & affect appropriate.     Data Reviewed: I have personally reviewed following labs and imaging studies  CBC:  Recent Labs Lab 03/08/16 1516  WBC 15.8*  HGB 11.3*  HCT 33.0*  MCV 85.9  PLT [id number]   Basic Metabolic Panel:  Recent Labs Lab 03/08/16 1516 03/08/16 2136 03/09/16 0047 03/09/16 0129  NA 136  --  138  --   K 2.7*  --  3.2*  --   CL 105  --  110  --   CO2 21*  --  21*  --   GLUCOSE 114*  --  169*  --   BUN 7  --  6  --   CREATININE 0.86  --  0.77  --   CALCIUM 9.0  --  8.5*  --   MG  --  1.2*  --  2.1   GFR: Estimated Creatinine Clearance: 93.6 mL/min (by C-G formula based on Cr of 0.77). Liver Function Tests:  Recent Labs Lab 03/08/16 1516  AST 18  ALT 17  ALKPHOS 46  BILITOT 1.2  PROT 7.9  ALBUMIN 4.2    Recent Labs Lab 03/08/16 1516  LIPASE 25   No results for input(s): AMMONIA in the last 168 hours. Coagulation Profile:  Recent Labs Lab  03/08/16 2136  INR 1.20   Cardiac Enzymes: No results for input(s): CKTOTAL, CKMB, CKMBINDEX, TROPONINI in the last 168 hours. BNP (last 3 results) No results for input(s): PROBNP in the last 8760 hours. HbA1C: No results for input(s): HGBA1C in the last 72 hours. CBG: No results for input(s): GLUCAP in the last 168 hours. Lipid Profile: No results for input(s): CHOL, HDL, LDLCALC, TRIG, CHOLHDL, LDLDIRECT in the last 72 hours. Thyroid Function Tests: No results for input(s): TSH, T4TOTAL, FREET4, T3FREE, THYROIDAB in the last 72 hours. Anemia Panel:  Recent Labs  03/09/16 0047  VITAMINB12 280  FERRITIN  189  TIBC 211*  IRON 11*   Sepsis Labs:  Recent Labs Lab 03/08/16 2136 03/09/16 0047  PROCALCITON <0.10  --   LATICACIDVEN 0.5 1.7    No results found for this or any previous visit (from the past 240 hour(s)).       Radiology Studies: Ct Abdomen Pelvis W Contrast  03/08/2016  CLINICAL DATA:  Vaginal bleeding on Sunday, patient does not have menstrual periods d/t ablation. Chills starting yesterday and high fevers. Nausea and vomiting. Abdominal pain. EXAM: CT ABDOMEN AND PELVIS WITH CONTRAST TECHNIQUE: Multidetector CT imaging of the abdomen and pelvis was performed using the standard protocol following bolus administration of intravenous contrast. CONTRAST:  169mL ISOVUE-300 IOPAMIDOL (ISOVUE-300) INJECTION 61% COMPARISON:  CT abdomen dated 11/04/2008 FINDINGS: Lower chest:  No acute findings. Hepatobiliary: 7 mm hypodense focus within the left liver lobe, too small to definitively characterize, favored to be a small cyst. Liver otherwise unremarkable. Gallbladder appears normal. No bile duct dilatation. Pancreas: No mass, inflammatory changes, or other significant abnormality. Spleen: Within normal limits in size and appearance. Adrenals/Urinary Tract: There is an obstructing 6 x 4 mm stone within the proximal left ureter causing moderate left-sided hydronephrosis and  perinephric inflammation. There is also dilatation of the proximal left ureter with associated periureteral inflammation. Probable small cyst within the posterior cortex of the left kidney. Right kidney appears normal without stone or hydronephrosis. Bladder is unremarkable. Stomach/Bowel: Bowel is normal in caliber. No bowel wall thickening or evidence of bowel wall inflammation seen. Appendix is not seen but there are no inflammatory changes about the cecum to suggest acute appendicitis. Stomach appears normal. Vascular/Lymphatic: Abdominal aorta is normal in caliber. Scattered small and moderate-sized lymph nodes in the left retroperitoneum are likely reactive in nature. No enlarged lymph nodes seen within the intraperitoneal abdomen or pelvis. Reproductive: Uterus and adnexal structures are unremarkable. Other: No intraperitoneal free fluid. No abscess collection. No free intraperitoneal air. Musculoskeletal: Probable chronic unilateral pars interarticularis defect on the left at L5-S1. Prominent disc desiccation at L5-S1, at least moderate in degree with disc space narrowing, osseous spurring and vacuum disc phenomenon. Remainder of the disc spaces appear well preserved. No acute or suspicious osseous finding. Superficial soft tissues are unremarkable. IMPRESSION: 1. Obstructing 6 x 4 mm stone within the proximal left ureter causing moderate left-sided hydronephrosis and perinephric inflammation. Superimposed infection/pyelonephritis cannot be excluded, especially concerning given the history of chills and fevers. 2. Chronic/incidental findings detailed above. Electronically Signed   By: Franki Cabot M.D.   On: 03/08/2016 17:31        Scheduled Meds: . buPROPion  150 mg Oral Daily  . ceFEPime (MAXIPIME) IV  2 g Intravenous Q12H  . enoxaparin (LOVENOX) injection  40 mg Subcutaneous Q24H  . feeding supplement (ENSURE ENLIVE)  237 mL Oral BID BM  . LORazepam  1 mg Oral BID  . multivitamin with  minerals  1 tablet Oral Daily  . OLANZapine  15 mg Oral QHS  . pantoprazole  40 mg Oral Daily  . pneumococcal 23 valent vaccine  0.5 mL Intramuscular Tomorrow-1000  . sodium chloride flush  3 mL Intravenous Q12H  . topiramate  50 mg Oral QHS  . traZODone  100 mg Oral QHS   Continuous Infusions:    LOS: 1 day     CHIU, Orpah Melter, MD [facility] Pager [phone]  If 7PM-7AM, please contact night-coverage [email or web address] Password [email or web address] 03/09/2016, 3:26 PM

## 2016-03-09 NOTE — Progress Notes (Signed)
No events overnight Afebrile since surgery  Filed Vitals:   03/08/16 2056 03/08/16 2110 03/09/16 0031 03/09/16 0441  BP:  123/72  104/65  Pulse:  87  57  Temp: 99 F (37.2 C) 99.4 F (37.4 C) 97.7 F (36.5 C) 97.6 F (36.4 C)  TempSrc:   Oral Oral  Resp:  14  16  Height:      Weight:      SpO2:  96%  100%   I/O last 3 completed shifts: In: 1538.3 [I.V.:1488.3; IV Piggyback:50] Out: 1400 [Urine:1400]     NAD Soft nt nd Foley clear yellow  POD 1 s/p cysto, left ureteral stent for UTI, left obstructing ureteral stone -d/c foley -continue broad spectrum abx pending c/s results -will need definitive outpt stone management once infection resolved.

## 2016-03-09 NOTE — Progress Notes (Signed)
Initial Nutrition Assessment  INTERVENTION:   Provide Ensure Enlive po BID, each supplement provides 350 kcal and 20 grams of protein Encourage small frequent meals RD to continue to monitor  Suspect some degree of disordered eating pattern. (see assessment below)  NUTRITION DIAGNOSIS:   Inadequate oral intake related to social / environmental circumstances as evidenced by per patient/family report.  GOAL:   Patient will meet greater than or equal to 90% of their needs  MONITOR:   PO intake, Supplement acceptance, Labs, Weight trends, I & O's  REASON FOR ASSESSMENT:   Malnutrition Screening Tool    ASSESSMENT:   40 y.o. female with medical history significant for bipolar disorder, tobacco abuse, and GERD who presents emergency department with complaints of malaise, fevers, chills, lower abdominal pain, and left flank pain since 03/03/16. Patient reports that her symptoms developed fairly acutely 5 days prior to her presentation and have persisted, even worsening over the ensuing days. She has never experienced similar symptoms previously and has not attempted any interventions for her complaints. Pain is described as constant, severe, crampy, localized to left flank and lower abdominal quadrants, and with no relieving or exacerbating factors identified. Patient denies hematuria, but notes that there had been blood in her panties on 03/06/2016 which she attributed to uterine bleeding, though she has not had a menstrual period in approximately 4 years and had undergone an endometrial ablation.  Patient in room with no family present. Pt reports having just been released from an inpatient treatment facility for her depression. She states she has had poor appetite ever since becoming depressed. She is currently prescribed Wellbutrin, Zyprexa, and Desyrel all medications which can increase appetite.  States that her breakfast meal of grits, bacon and milk was the first meal she has had in a  long time. She states she used to weigh up to 208 lb at one time and from then has been hesitant to eat. She states she nibbles on olives, drinks pickle juice and licks lemons instead of eating. Most of her caloric intake comes from drinking Coke all day. Patient states her pantry and fridge are filled with foods she likes to eat but she cannot get herself to eat.   Reviewed psych notes in chart, did not see where these issues were addressed. Suspect some degree of disordered eating pattern.   Recommended patient try protein supplements. She states she would buy them but financially it may be an issue. Will provide coupons at discharge. Reviewed more affordable protein options with patient and reviewed protein foods. Suspect some degree of malnutrition but weight loss has been insignificant and RD was unable to perform NFPE at this time. Will attempt at follow-up. Pt willing to try Ensure supplements here and encouraged pt to consume small frequent meals.  Medications: Multivitamin with minerals daily Labs reviewed: Low K Mg WNL -was low at admission  Diet Order:  Diet regular Room service appropriate?: Yes; Fluid consistency:: Thin  Skin:  Wound (see comment) (7/4 Perineal incision)  Last BM:  7/3  Height:   Ht Readings from Last 1 Encounters:  03/08/16 5\' 5"  (1.651 m)    Weight:   Wt Readings from Last 1 Encounters:  03/08/16 168 lb (76.204 kg)    Ideal Body Weight:  56.8 kg  BMI:  Body mass index is 27.96 kg/(m^2).  Estimated Nutritional Needs:   Kcal:  1800-2000  Protein:  90-100g  Fluid:  2L/day  EDUCATION NEEDS:   Education needs addressed  Clayton Bibles,  MS, RD, LDN Pager: [phone] After Hours Pager: [phone]

## 2016-03-09 NOTE — Progress Notes (Signed)
Patient ID: [name], female   DOB: [birth year], 40 y.o.   MRN: [id number] Discharge Note  Patient:  Sandra Miller is an 40 y.o., female DOB:  [birth year]  Date of Admission:  02/16/2016  Date of Discharge:  03/03/2016  Reason for Admission:depression  IOP Course:  Sandra Miller participated in groups mostly listening but said she felt it was useful to her.  She left feeling some better in that she was returning to more work hours which was part of why she got so depressed in the first place.  She was more accepting of her son's relationship with a mystery woman and that helped her feel better as well.  She requested a discharge so she could return to work.  Mental Status at Discharge:no suicidal thoughts, still depressed but less so than on admission  Lab Results: No results found for this or any previous visit (from the past 60 hour(s)).  No current facility-administered medications for this visit. No current outpatient prescriptions on file.  Facility-Administered Medications Ordered in Other Visits:  .  acetaminophen (TYLENOL) tablet 650 mg, 650 mg, Oral, Q6H PRN **OR** acetaminophen (TYLENOL) suppository 650 mg, 650 mg, Rectal, Q6H PRN, Ilene Qua Opyd, MD .  bisacodyl (DULCOLAX) EC tablet 5 mg, 5 mg, Oral, Daily PRN, Ilene Qua Opyd, MD .  buPROPion (WELLBUTRIN XL) 24 hr tablet 150 mg, 150 mg, Oral, Daily, Timothy S Opyd, MD .  ceFEPIme (MAXIPIME) 2 g in dextrose 5 % 50 mL IVPB, 2 g, Intravenous, Q12H, Ilene Qua Opyd, MD, 2 g at 03/09/16 0205 .  enoxaparin (LOVENOX) injection 40 mg, 40 mg, Subcutaneous, Q24H, Ilene Qua Opyd, MD, 40 mg at 03/09/16 0754 .  HYDROcodone-acetaminophen (NORCO/VICODIN) 5-325 MG per tablet 1-2 tablet, 1-2 tablet, Oral, Q4H PRN, Ilene Qua Opyd, MD .  HYDROmorphone (DILAUDID) injection 0.5 mg, 0.5 mg, Intravenous, Q3H PRN, Ilene Qua Opyd, MD .  LORazepam (ATIVAN) tablet 1 mg, 1 mg, Oral, BID, Ilene Qua Opyd, MD, 1 mg at 03/08/16 2316 .  multivitamin with  minerals tablet 1 tablet, 1 tablet, Oral, Daily, Ilene Qua Opyd, MD .  OLANZapine (ZYPREXA) tablet 15 mg, 15 mg, Oral, QHS, Ilene Qua Opyd, MD, 15 mg at 03/08/16 2321 .  ondansetron (ZOFRAN) tablet 4 mg, 4 mg, Oral, Q6H PRN **OR** ondansetron (ZOFRAN) injection 4 mg, 4 mg, Intravenous, Q6H PRN, Ilene Qua Opyd, MD .  pantoprazole (PROTONIX) EC tablet 40 mg, 40 mg, Oral, Daily, Timothy S Opyd, MD .  pneumococcal 23 valent vaccine (PNU-IMMUNE) injection 0.5 mL, 0.5 mL, Intramuscular, Tomorrow-1000, Timothy S Opyd, MD .  polyethylene glycol (MIRALAX / GLYCOLAX) packet 17 g, 17 g, Oral, Daily PRN, Ilene Qua Opyd, MD .  sodium chloride flush (NS) 0.9 % injection 3 mL, 3 mL, Intravenous, Q12H, Ilene Qua Opyd, MD, 0 mL at 03/08/16 2317 .  topiramate (TOPAMAX) tablet 50 mg, 50 mg, Oral, QHS, Ilene Qua Opyd, MD, 50 mg at 03/08/16 2316 .  traZODone (DESYREL) tablet 100 mg, 100 mg, Oral, QHS, Ilene Qua Opyd, MD, 100 mg at 03/08/16 2317  Axis Diagnosis:  Bipolar disorder, unspecified type   Level of Care:  IOP  Discharge destination:  Other:  has appointments with her psychiatrist and therapist  Is patient on multiple antipsychotic therapies at discharge:  No    Has Patient had three or more failed trials of antipsychotic monotherapy by history:  Negative  Patient phone:  [phone] (home)  Patient address:   [address]  [place] [address],   Follow-up recommendations:  Activity:  continue current activity  Diet:  continue current diet  Comments:  none  The patient received suicide prevention pamphlet:  Yes   [name] 03/09/2016, 8:36 AM

## 2016-03-10 LAB — CBC
HCT: 30.1 % — ABNORMAL LOW (ref 36.0–46.0)
HEMOGLOBIN: 10.2 g/dL — AB (ref 12.0–15.0)
MCH: 29.3 pg (ref 26.0–34.0)
MCHC: 33.9 g/dL (ref 30.0–36.0)
MCV: 86.5 fL (ref 78.0–100.0)
PLATELETS: 331 10*3/uL (ref 150–400)
RBC: 3.48 MIL/uL — AB (ref 3.87–5.11)
RDW: 13.8 % (ref 11.5–15.5)
WBC: 12.1 10*3/uL — AB (ref 4.0–10.5)

## 2016-03-10 LAB — BASIC METABOLIC PANEL
ANION GAP: 5 (ref 5–15)
BUN: 12 mg/dL (ref 6–20)
CALCIUM: 8.9 mg/dL (ref 8.9–10.3)
CO2: 23 mmol/L (ref 22–32)
Chloride: 111 mmol/L (ref 101–111)
Creatinine, Ser: 0.72 mg/dL (ref 0.44–1.00)
Glucose, Bld: 102 mg/dL — ABNORMAL HIGH (ref 65–99)
Potassium: 3.3 mmol/L — ABNORMAL LOW (ref 3.5–5.1)
SODIUM: 139 mmol/L (ref 135–145)

## 2016-03-10 MED ORDER — FERROUS SULFATE 325 (65 FE) MG PO TABS: 325.0000 mg | ORAL_TABLET | Freq: Every day | ORAL | Status: DC

## 2016-03-10 MED ORDER — HYDROCODONE-ACETAMINOPHEN 5-325 MG PO TABS: 1.0000 | ORAL_TABLET | ORAL | Status: DC | PRN

## 2016-03-10 MED ORDER — POTASSIUM CHLORIDE CRYS ER 20 MEQ PO TBCR
40.0000 meq | EXTENDED_RELEASE_TABLET | Freq: Two times a day (BID) | ORAL | Status: DC
  Administered 2016-03-10: 40 meq via ORAL
  Filled 2016-03-10: qty 2

## 2016-03-10 MED ORDER — CEFUROXIME AXETIL 250 MG PO TABS: 250.0000 mg | ORAL_TABLET | Freq: Two times a day (BID) | ORAL | Status: DC

## 2016-03-10 MED ORDER — DOCUSATE SODIUM 100 MG PO CAPS: 100.0000 mg | ORAL_CAPSULE | Freq: Two times a day (BID) | ORAL | Status: DC

## 2016-03-10 NOTE — Discharge Summary (Signed)
Physician Discharge Summary  Sandra Miller [id number] DOB: November 21, 1975 DOA: 03/08/2016  PCP: Lavon Paganini, MD  Admit date: 03/08/2016 Discharge date: 03/10/2016  Disposition:  Home  Recommendations for Outpatient Follow-up:  1. Follow up with PCP in 1-2 weeks 2. Follow up with Dr. Pilar Jarvis as scheduled in 1 week from discharge 3. Please follow-up urine culture results, still pending as of 03/10/2016 4. Recommend outpatient referral to GYN when more medically stable to address uterine bleeding  Discharge Condition:Stable CODE STATUS:Full Diet recommendation: Regular  Brief/Interim Summary: 40 y.o. female with medical history significant for bipolar disorder, tobacco abuse, and GERD who presents emergency department with complaints of malaise, fevers, chills, lower abdominal pain, and left flank pain since 03/03/16. Patient reports that her symptoms developed fairly acutely 5 days prior to her presentation and have persisted, even worsening over the ensuing days. She has never experienced similar symptoms previously and has not attempted any interventions for her complaints. Pain is described as constant, severe, crampy, localized to left flank and lower abdominal quadrants, and with no relieving or exacerbating factors identified. Patient denies hematuria, but notes that there had been blood in her panties on 03/06/2016 which she attributed to uterine bleeding, though she has not had a menstrual period in approximately 4 years and had undergone an endometrial ablation   1. Left-sided pyelonephritis with obstructing ureteral calculus and ?sepsis  - Urology consulting; left ureteral stent placed 03/08/16; stone will need definitive management in outpatient setting once infection resolved  - Presented with temp 39.4 C, HR 120, WBC 15,800 and UTI - CT abd/pel with 6x4 mm obstructing stone in proximal left ureter with perinephric inflammation; stent placed to left ureter by Dr. Pilar Jarvis on 03/08/16   - Urine culture remains pending - Empiric ampicillin and gentamicin administered in ED, will continued empiric treatment with cefepime 2g q12h - No longer septic with WBC trending down, thus patient seemed to respond to current antibiotic regimen. - Plan to continue Ceftin to complete 14 days of tx. Continuation of antibiotics per discretion of urology  2. Hypokalemia  - Serum potassium 2.7 on admission, suspected secondary to GI losses in setting of N/V  - Replaced   3. Normocytic anemia  - Hgb 11.3 on admission, previously normal  - Pt reports discovery of blood in panties 7/2 and 7/3, which she attributes to uterine source as below  - Pt is iron deficient. Would continue iron replacement on discharge  4. Bipolar disorder  - Stable, pt denies SI, HI, or hallucinations - Continue current management with Zyprexa, Topamax, Wellbutrin, trazodone, and prn Ativan   5. GERD - Stable, managed with daily Prilosec at home  - Continued PPI therapy with Protonix while in hospital   6. Dysfunctional uterine bleeding  - Pt noted blood in underwear on 7/2 and 7/3  - She is s/p ablation and has not menstruated in 4 yrs  - Uterus and adnexa appear unremarkable on CT  - Can consider further eval with pelvic US; can likely be done outpatient when more stable  Discharge Diagnoses:  Principal Problem:   Obstructive pyelonephritis Active Problems:   DISORDER, BIPOLAR NOS   Tobacco abuse   GERD (gastroesophageal reflux disease)   Nephrolithiasis   Dysfunctional uterine bleeding   Hypokalemia   Normocytic anemia   UTI (lower urinary tract infection)      Medication List    STOP taking these medications        clindamycin 300 MG capsule  Commonly known as:  CLEOCIN     fluconazole 150 MG tablet  Commonly known as:  DIFLUCAN     ibuprofen 600 MG tablet  Commonly known as:  ADVIL,MOTRIN      TAKE these medications        acetaminophen 500 MG tablet  Commonly known  as:  TYLENOL  Take 1,000 mg by mouth every 6 (six) hours as needed for moderate pain or fever.     buPROPion 150 MG 24 hr tablet  Commonly known as:  WELLBUTRIN XL  Take 1 tablet (150 mg total) by mouth every morning.     cefUROXime 250 MG tablet  Commonly known as:  CEFTIN  Take 1 tablet (250 mg total) by mouth 2 (two) times daily with a meal.     docusate sodium 100 MG capsule  Commonly known as:  COLACE  Take 1 capsule (100 mg total) by mouth 2 (two) times daily.     ferrous sulfate 325 (65 FE) MG tablet  Commonly known as:  FERROUSUL  Take 1 tablet (325 mg total) by mouth daily with breakfast.     HYDROcodone-acetaminophen 5-325 MG tablet  Commonly known as:  NORCO/VICODIN  Take 1-2 tablets by mouth every 4 (four) hours as needed for moderate pain.     LORazepam 1 MG tablet  Commonly known as:  ATIVAN  Take 1 tablet (1 mg total) by mouth 2 (two) times daily.     multivitamin tablet  Take 1 tablet by mouth daily.     OLANZapine 15 MG tablet  Commonly known as:  ZYPREXA  Take 1 tablet (15 mg total) by mouth at bedtime.     omeprazole 40 MG capsule  Commonly known as:  PRILOSEC  take 1 capsule by mouth twice a day     topiramate 25 MG tablet  Commonly known as:  TOPAMAX  take 2 tablets by mouth at bedtime     traZODone 100 MG tablet  Commonly known as:  DESYREL  Take 1 tablet (100 mg total) by mouth at bedtime.     valACYclovir 500 MG tablet  Commonly known as:  VALTREX  Take 1 tablet (500 mg total) by mouth daily as needed (break outs).           Follow-up Information    Follow up with Nickie Retort, MD In 1 week.   Specialty:  Urology   Why:  discuss stone surgery   Contact information:   [place] [id number] [phone]       Follow up with Lavon Paganini, MD In 1 week.   Specialty:  Family Medicine   Why:  Hospital follow up   Contact information:   [address] [place] [address] [phone]      Allergies   Allergen Reactions  . Aspirin Hives  . Minocycline Hives    Consultations:  Urology   Procedures/Studies: Ct Abdomen Pelvis W Contrast  03/08/2016  CLINICAL DATA:  Vaginal bleeding on Sunday, patient does not have menstrual periods d/t ablation. Chills starting yesterday and high fevers. Nausea and vomiting. Abdominal pain. EXAM: CT ABDOMEN AND PELVIS WITH CONTRAST TECHNIQUE: Multidetector CT imaging of the abdomen and pelvis was performed using the standard protocol following bolus administration of intravenous contrast. CONTRAST:  149mL ISOVUE-300 IOPAMIDOL (ISOVUE-300) INJECTION 61% COMPARISON:  CT abdomen dated 11/04/2008 FINDINGS: Lower chest:  No acute findings. Hepatobiliary: 7 mm hypodense focus within the left liver lobe, too small to definitively characterize, favored to be  a small cyst. Liver otherwise unremarkable. Gallbladder appears normal. No bile duct dilatation. Pancreas: No mass, inflammatory changes, or other significant abnormality. Spleen: Within normal limits in size and appearance. Adrenals/Urinary Tract: There is an obstructing 6 x 4 mm stone within the proximal left ureter causing moderate left-sided hydronephrosis and perinephric inflammation. There is also dilatation of the proximal left ureter with associated periureteral inflammation. Probable small cyst within the posterior cortex of the left kidney. Right kidney appears normal without stone or hydronephrosis. Bladder is unremarkable. Stomach/Bowel: Bowel is normal in caliber. No bowel wall thickening or evidence of bowel wall inflammation seen. Appendix is not seen but there are no inflammatory changes about the cecum to suggest acute appendicitis. Stomach appears normal. Vascular/Lymphatic: Abdominal aorta is normal in caliber. Scattered small and moderate-sized lymph nodes in the left retroperitoneum are likely reactive in nature. No enlarged lymph nodes seen within the intraperitoneal abdomen or pelvis. Reproductive:  Uterus and adnexal structures are unremarkable. Other: No intraperitoneal free fluid. No abscess collection. No free intraperitoneal air. Musculoskeletal: Probable chronic unilateral pars interarticularis defect on the left at L5-S1. Prominent disc desiccation at L5-S1, at least moderate in degree with disc space narrowing, osseous spurring and vacuum disc phenomenon. Remainder of the disc spaces appear well preserved. No acute or suspicious osseous finding. Superficial soft tissues are unremarkable. IMPRESSION: 1. Obstructing 6 x 4 mm stone within the proximal left ureter causing moderate left-sided hydronephrosis and perinephric inflammation. Superimposed infection/pyelonephritis cannot be excluded, especially concerning given the history of chills and fevers. 2. Chronic/incidental findings detailed above. Electronically Signed   By: Franki Cabot M.D.   On: 03/08/2016 17:31      Subjective: Complains of continued left-sided pain  Discharge Exam: Filed Vitals:   03/10/16 0502 03/10/16 1316  BP: 130/69 99/60  Pulse: 73 83  Temp: 98 F (36.7 C) 98.3 F (36.8 C)  Resp: 18 16   Filed Vitals:   03/09/16 1510 03/09/16 2132 03/10/16 0502 03/10/16 1316  BP: 117/72 111/70 130/69 99/60  Pulse: 73 74 73 83  Temp:  98.2 F (36.8 C) 98 F (36.7 C) 98.3 F (36.8 C)  TempSrc:  Oral Oral Oral  Resp: 18 18 18 16   Height:      Weight:      SpO2: 100% 98% 99% 100%    General: Pt is alert, awake, not in acute distress Cardiovascular: RRR, S1/S2  Respiratory: CTA bilaterally, no wheezing, no rhonchi Abdominal: Soft, ND, bowel sounds + Extremities: no edema, no cyanosis    The results of significant diagnostics from this hospitalization (including imaging, microbiology, ancillary and laboratory) are listed below for reference.     Microbiology: Recent Results (from the past 240 hour(s))  Urine culture     Status: None (Preliminary result)   Collection Time: 03/08/16  8:17 PM  Result Value  Ref Range Status   Specimen Description URINE, CATHETERIZED  Final   Special Requests NONE  Final   Culture   Final    CULTURE REINCUBATED FOR BETTER GROWTH Performed at [facility]    Report Status PENDING  Incomplete  Culture, blood (x 2)     Status: None (Preliminary result)   Collection Time: 03/08/16  9:37 PM  Result Value Ref Range Status   Specimen Description BLOOD LEFT ANTECUBITAL  Final   Special Requests BOTTLES DRAWN AEROBIC AND ANAEROBIC 5CC EA  Final   Culture   Final    NO GROWTH 1 DAY Performed at [facility]  Report Status PENDING  Incomplete  Culture, blood (x 2)     Status: None (Preliminary result)   Collection Time: 03/08/16  9:41 PM  Result Value Ref Range Status   Specimen Description BLOOD BLOOD LEFT HAND  Final   Special Requests BOTTLES DRAWN AEROBIC AND ANAEROBIC 5CC EA  Final   Culture   Final    NO GROWTH 1 DAY Performed at [facility]    Report Status PENDING  Incomplete     Labs: BNP (last 3 results) No results for input(s): BNP in the last 8760 hours. Basic Metabolic Panel:  Recent Labs Lab 03/08/16 1516 03/08/16 2136 03/09/16 0047 03/09/16 0129 03/10/16 0407  NA 136  --  138  --  139  K 2.7*  --  3.2*  --  3.3*  CL 105  --  110  --  111  CO2 21*  --  21*  --  23  GLUCOSE 114*  --  169*  --  102*  BUN 7  --  6  --  12  CREATININE 0.86  --  0.77  --  0.72  CALCIUM 9.0  --  8.5*  --  8.9  MG  --  1.2*  --  2.1  --    Liver Function Tests:  Recent Labs Lab 03/08/16 1516  AST 18  ALT 17  ALKPHOS 46  BILITOT 1.2  PROT 7.9  ALBUMIN 4.2    Recent Labs Lab 03/08/16 1516  LIPASE 25   No results for input(s): AMMONIA in the last 168 hours. CBC:  Recent Labs Lab 03/08/16 1516 03/10/16 0407  WBC 15.8* 12.1*  HGB 11.3* 10.2*  HCT 33.0* 30.1*  MCV 85.9 86.5  PLT 350 331   Cardiac Enzymes: No results for input(s): CKTOTAL, CKMB, CKMBINDEX, TROPONINI in the last 168 hours. BNP: Invalid  input(s): POCBNP CBG: No results for input(s): GLUCAP in the last 168 hours. D-Dimer No results for input(s): DDIMER in the last 72 hours. Hgb A1c No results for input(s): HGBA1C in the last 72 hours. Lipid Profile No results for input(s): CHOL, HDL, LDLCALC, TRIG, CHOLHDL, LDLDIRECT in the last 72 hours. Thyroid function studies No results for input(s): TSH, T4TOTAL, T3FREE, THYROIDAB in the last 72 hours.  Invalid input(s): FREET3 Anemia work up  Recent Labs  03/09/16 0047  VITAMINB12 280  FERRITIN 189  TIBC 211*  IRON 11*   Urinalysis    Component Value Date/Time   COLORURINE YELLOW 03/08/2016 1550   APPEARANCEUR CLOUDY* 03/08/2016 1550   LABSPEC 1.007 03/08/2016 1550   PHURINE 5.5 03/08/2016 1550   GLUCOSEU NEGATIVE 03/08/2016 1550   HGBUR SMALL* 03/08/2016 1550   BILIRUBINUR NEGATIVE 03/08/2016 1550   BILIRUBINUR NEG 12/05/2014 1342   KETONESUR NEGATIVE 03/08/2016 1550   PROTEINUR NEGATIVE 03/08/2016 1550   PROTEINUR NEG 12/05/2014 1342   UROBILINOGEN 0.2 12/05/2014 1342   UROBILINOGEN 0.2 11/25/2013 1641   NITRITE POSITIVE* 03/08/2016 1550   NITRITE NEG 12/05/2014 1342   LEUKOCYTESUR LARGE* 03/08/2016 1550   Sepsis Labs Invalid input(s): PROCALCITONIN,  WBC,  LACTICIDVEN Microbiology Recent Results (from the past 240 hour(s))  Urine culture     Status: None (Preliminary result)   Collection Time: 03/08/16  8:17 PM  Result Value Ref Range Status   Specimen Description URINE, CATHETERIZED  Final   Special Requests NONE  Final   Culture   Final    CULTURE REINCUBATED FOR BETTER GROWTH Performed at [facility]    Report  Status PENDING  Incomplete  Culture, blood (x 2)     Status: None (Preliminary result)   Collection Time: 03/08/16  9:37 PM  Result Value Ref Range Status   Specimen Description BLOOD LEFT ANTECUBITAL  Final   Special Requests BOTTLES DRAWN AEROBIC AND ANAEROBIC 5CC EA  Final   Culture   Final    NO GROWTH 1 DAY Performed at  [facility]    Report Status PENDING  Incomplete  Culture, blood (x 2)     Status: None (Preliminary result)   Collection Time: 03/08/16  9:41 PM  Result Value Ref Range Status   Specimen Description BLOOD BLOOD LEFT HAND  Final   Special Requests BOTTLES DRAWN AEROBIC AND ANAEROBIC 5CC EA  Final   Culture   Final    NO GROWTH 1 DAY Performed at [facility]    Report Status PENDING  Incomplete     CHIU, Orpah Melter, MD  [facility] 03/10/2016, 2:46 PM  If 7PM-7AM, please contact night-coverage [email or web address] Password [email or web address]

## 2016-03-10 NOTE — Progress Notes (Signed)
Nutrition Brief Follow-Up  RD followed-up with patient to provide Ensure and Boost coupons. Pt reports not being able to afford supplements. She had questions regarding if insurance would cover nutrition supplements. RD was unable to answer these questions. Pt ate ~25-50% of her lunch today.  Pt with no nutrition related questions.  Please consult and/or page RD with questions.   Clayton Bibles, MS, RD, LDN Pager: [phone] After Hours Pager: [phone]

## 2016-03-10 NOTE — Progress Notes (Signed)
Discharge instructions reviewed. Questions concerns denied. Urology office will f/u with pt d/t next availability September. Pt to f/u with pcp for f/u also

## 2016-03-11 LAB — URINE CULTURE

## 2016-03-11 LAB — FOLATE RBC
FOLATE, RBC: 1550 ng/mL (ref >498)
Folate, Hemolysate: 485 ng/mL
Hematocrit: 31.3 % — ABNORMAL LOW (ref 34.0–46.6)

## 2016-03-14 ENCOUNTER — Encounter (HOSPITAL_COMMUNITY): Payer: Self-pay

## 2016-03-14 ENCOUNTER — Ambulatory Visit: Payer: Self-pay | Admitting: Family Medicine

## 2016-03-14 ENCOUNTER — Emergency Department (HOSPITAL_COMMUNITY): Payer: Medicare Other

## 2016-03-14 ENCOUNTER — Emergency Department (HOSPITAL_COMMUNITY)
Admission: EM | Admit: 2016-03-14 | Discharge: 2016-03-14 | Disposition: A | Discharge status: Home / Self Care | Payer: Medicare Other | Attending: Emergency Medicine | Admitting: Emergency Medicine

## 2016-03-14 DIAGNOSIS — Z8659 Personal history of other mental and behavioral disorders: Secondary | ICD-10-CM | POA: Insufficient documentation

## 2016-03-14 DIAGNOSIS — F17210 Nicotine dependence, cigarettes, uncomplicated: Secondary | ICD-10-CM | POA: Insufficient documentation

## 2016-03-14 DIAGNOSIS — R109 Unspecified abdominal pain: Secondary | ICD-10-CM | POA: Insufficient documentation

## 2016-03-14 DIAGNOSIS — F4310 Post-traumatic stress disorder, unspecified: Secondary | ICD-10-CM | POA: Insufficient documentation

## 2016-03-14 DIAGNOSIS — F1290 Cannabis use, unspecified, uncomplicated: Secondary | ICD-10-CM | POA: Insufficient documentation

## 2016-03-14 DIAGNOSIS — F319 Bipolar disorder, unspecified: Secondary | ICD-10-CM | POA: Diagnosis not present

## 2016-03-14 DIAGNOSIS — Z79899 Other long term (current) drug therapy: Secondary | ICD-10-CM | POA: Insufficient documentation

## 2016-03-14 DIAGNOSIS — I10 Essential (primary) hypertension: Secondary | ICD-10-CM | POA: Insufficient documentation

## 2016-03-14 DIAGNOSIS — N281 Cyst of kidney, acquired: Secondary | ICD-10-CM | POA: Diagnosis not present

## 2016-03-14 DIAGNOSIS — F1990 Other psychoactive substance use, unspecified, uncomplicated: Secondary | ICD-10-CM | POA: Insufficient documentation

## 2016-03-14 DIAGNOSIS — Z95818 Presence of other cardiac implants and grafts: Secondary | ICD-10-CM | POA: Diagnosis not present

## 2016-03-14 LAB — CBC WITH DIFFERENTIAL/PLATELET
Basophils Absolute: 0 10*3/uL (ref 0.0–0.1)
Basophils Relative: 0 %
Eosinophils Absolute: 0.2 10*3/uL (ref 0.0–0.7)
Eosinophils Relative: 2 %
HCT: 36.6 % (ref 36.0–46.0)
Hemoglobin: 12.1 g/dL (ref 12.0–15.0)
Lymphocytes Relative: 29 %
Lymphs Abs: 3.5 10*3/uL (ref 0.7–4.0)
MCH: 28.7 pg (ref 26.0–34.0)
MCHC: 33.1 g/dL (ref 30.0–36.0)
MCV: 86.9 fL (ref 78.0–100.0)
Monocytes Absolute: 0.6 10*3/uL (ref 0.1–1.0)
Monocytes Relative: 5 %
Neutro Abs: 7.5 10*3/uL (ref 1.7–7.7)
Neutrophils Relative %: 64 %
Platelets: 494 10*3/uL — ABNORMAL HIGH (ref 150–400)
RBC: 4.21 MIL/uL (ref 3.87–5.11)
RDW: 13.8 % (ref 11.5–15.5)
WBC: 11.8 10*3/uL — ABNORMAL HIGH (ref 4.0–10.5)

## 2016-03-14 LAB — CULTURE, BLOOD (ROUTINE X 2)
CULTURE: NO GROWTH
CULTURE: NO GROWTH

## 2016-03-14 LAB — BASIC METABOLIC PANEL
Anion gap: 7 (ref 5–15)
BUN: 12 mg/dL (ref 6–20)
CO2: 25 mmol/L (ref 22–32)
Calcium: 9.8 mg/dL (ref 8.9–10.3)
Chloride: 106 mmol/L (ref 101–111)
Creatinine, Ser: 0.76 mg/dL (ref 0.44–1.00)
GFR calc Af Amer: >60 mL/min (ref >60)
GFR calc non Af Amer: >60 mL/min (ref >60)
Glucose, Bld: 82 mg/dL (ref 65–99)
Potassium: 3.8 mmol/L (ref 3.5–5.1)
Sodium: 138 mmol/L (ref 135–145)

## 2016-03-14 LAB — URINALYSIS, ROUTINE W REFLEX MICROSCOPIC
Bilirubin Urine: NEGATIVE
Glucose, UA: NEGATIVE mg/dL
Ketones, ur: NEGATIVE mg/dL
Nitrite: NEGATIVE
Protein, ur: 100 mg/dL — AB
Specific Gravity, Urine: 1.012 (ref 1.005–1.030)
pH: 7 (ref 5.0–8.0)

## 2016-03-14 LAB — URINE MICROSCOPIC-ADD ON

## 2016-03-14 MED ORDER — MORPHINE SULFATE (PF) 2 MG/ML IV SOLN
2.0000 mg | Freq: Once | INTRAVENOUS | Status: AC
  Administered 2016-03-14: 2 mg via INTRAVENOUS
  Filled 2016-03-14: qty 1

## 2016-03-14 MED ORDER — OXYBUTYNIN CHLORIDE 5 MG PO TABS: 5.0000 mg | ORAL_TABLET | Freq: Three times a day (TID) | ORAL | Status: DC

## 2016-03-14 MED ORDER — OXYCODONE-ACETAMINOPHEN 5-325 MG PO TABS: 1.0000 | ORAL_TABLET | Freq: Four times a day (QID) | ORAL | Status: DC | PRN

## 2016-03-14 NOTE — ED Notes (Signed)
Per EMS, pt from home.  Pt c/o left sided flank pain.  DX with 89mm stone.  Had stent placed.  Pt was septic and then d/c home.  Pt was to follow up with primary MD today and did not go as of yet.  Told to come here d/t pain.  Pt was started on Iron on Tuesday.  Pt also on antibiotics.  Hx of same.  Vitals:  124/64, hr 116, 100% ra, resp 14.

## 2016-03-14 NOTE — ED Notes (Signed)
Ultrasound at bedside

## 2016-03-14 NOTE — ED Notes (Signed)
Bed: [id number] Expected date:  Expected time:  Means of arrival:  Comments: EMS- 40yo F, flank pain/Hx of kidney stones

## 2016-03-14 NOTE — Discharge Instructions (Signed)
Please take medication as directed, please follow-up with Dr. Pilar Jarvis tomorrow as previously scheduled. Please return immediately if any new or worsening signs or symptoms present.

## 2016-03-14 NOTE — ED Notes (Signed)
PT have been made aware of urine sample 

## 2016-03-14 NOTE — ED Provider Notes (Signed)
CSN: [id number]     Arrival date & time 03/14/16  1137 History   First MD Initiated Contact with Patient 03/14/16 1215     Chief Complaint  Patient presents with  . Flank Pain      HPI   40 year old female presents today with complaints of left flank pain. Patient was most recently hospitalized on 03/08/2016 26 days ago for pyelonephritis with obstructing ureteral calculus and potential sepsis. Patient was seen by Dr. Pilar Jarvis who placed a stent on 7/ 4 with definitive management after infection improved. Patient reported that she was discharged home, continue to have pain at that time but had improved on her infectious type symptoms. She notes that over the last 2 days pain has significantly improved, noted using at home pain medication with no resolution of symptoms. Patient currently taking Ceftin.  Patient denies any fever, chills, reports some nausea denies any vomiting.   Past Medical History  Diagnosis Date  . Anxiety   . Hypertension     history of  . GERD (gastroesophageal reflux disease)   . Hiatal hernia   . HSV   . DISORDER, BIPOLAR NOS   . ALCOHOLISM     history of  . DISORDER, TOBACCO USE   . SUBSTANCE ABUSE     history of  . CONSTIPATION   . Abdominal pain, unspecified site   . EPIGASTRIC PAIN   . HELICOBACTER PYLORI GASTRITIS, HX OF   . NEPHROLITHIASIS, HX OF   . HX, PERSONAL, GENITAL/OBSTETRIC DISORDER NEC   . Esophageal dysphagia    Past Surgical History  Procedure Laterality Date  . Cesarean section  1995  . Knee arthroscopy      left  . Tubal ligation  1995  . [facility]   . Esophagogastroduodenoscopy    . Colonoscopy    . Cystoscopy with retrograde pyelogram, ureteroscopy and stent placement Left 03/08/2016    Procedure: CYSTOSCOPY WITH  LEFT RETROGRADE PYELOGRAM, AND STENT PLACEMENT;  Surgeon: Nickie Retort, MD;  Location: [facility];  Service: Urology;  Laterality: Left;   Family History  Problem Relation Age of Onset   . Stomach cancer Paternal Grandfather   . Diabetes Maternal Grandfather   . Diabetes Paternal Grandmother   . Heart disease Father   . Lung cancer Father   . Alcohol abuse Father   . Heart disease Mother   . Depression Mother   . Anxiety disorder Mother   . Drug abuse Brother   . Alcohol abuse Brother   . Kidney disease Maternal Uncle   . Cirrhosis Cousin     alcoholic  . Anxiety disorder Maternal Aunt   . Depression Maternal Aunt   . Colon polyps Brother   . Drug abuse Brother   . ADD / ADHD Brother    Social History  Substance Use Topics  . Smoking status: Current Every Day Smoker -- 1.00 packs/day for 22 years    Types: Cigarettes  . Smokeless tobacco: Never Used     Comment: form given 10-27-11/patches are not covered under insurance  . Alcohol Use: No     Comment: sober for 9 months   OB History    No data available     Review of Systems  All other systems reviewed and are negative.   Allergies  Aspirin and Minocycline  Home Medications   Prior to Admission medications   Medication Sig Start Date End Date Taking? Authorizing Provider  acetaminophen (TYLENOL)  500 MG tablet Take 1,000 mg by mouth every 6 (six) hours as needed for moderate pain or fever.   Yes Historical Provider, MD  buPROPion (WELLBUTRIN XL) 150 MG 24 hr tablet Take 1 tablet (150 mg total) by mouth every morning. 02/17/16 02/16/17 Yes Clarene Reamer, MD  cefUROXime (CEFTIN) 250 MG tablet Take 1 tablet (250 mg total) by mouth 2 (two) times daily with a meal. Patient taking differently: Take 250 mg by mouth 2 (two) times daily with a meal. Started 07/07 for 14 days 03/10/16  Yes Donne Hazel, MD  docusate sodium (COLACE) 100 MG capsule Take 1 capsule (100 mg total) by mouth 2 (two) times daily. Patient taking differently: Take 100 mg by mouth daily.  03/10/16  Yes Donne Hazel, MD  ferrous sulfate (FERROUSUL) 325 (65 FE) MG tablet Take 1 tablet (325 mg total) by mouth daily with breakfast. 03/10/16   Yes Donne Hazel, MD  HYDROcodone-acetaminophen (NORCO/VICODIN) 5-325 MG tablet Take 1-2 tablets by mouth every 4 (four) hours as needed for moderate pain. 03/10/16  Yes Donne Hazel, MD  LORazepam (ATIVAN) 1 MG tablet Take 1 tablet (1 mg total) by mouth 2 (two) times daily. Patient taking differently: Take 1 mg by mouth 2 (two) times daily as needed for anxiety.  02/11/16  Yes Kathlee Nations, MD  Multiple Vitamin (MULTIVITAMIN) tablet Take 1 tablet by mouth daily.   Yes Historical Provider, MD  OLANZapine (ZYPREXA) 15 MG tablet Take 1 tablet (15 mg total) by mouth at bedtime. 02/11/16 02/10/17 Yes Kathlee Nations, MD  omeprazole (PRILOSEC) 40 MG capsule take 1 capsule by mouth twice a day 07/20/15  Yes Gatha Mayer, MD  topiramate (TOPAMAX) 25 MG tablet take 2 tablets by mouth at bedtime 02/15/16  Yes Kathlee Nations, MD  traZODone (DESYREL) 100 MG tablet Take 1 tablet (100 mg total) by mouth at bedtime. 02/11/16  Yes Kathlee Nations, MD  valACYclovir (VALTREX) 500 MG tablet Take 1 tablet (500 mg total) by mouth daily as needed (break outs). 04/26/15  Yes Virginia Crews, MD  oxybutynin (DITROPAN) 5 MG tablet Take 1 tablet (5 mg total) by mouth 3 (three) times daily. 03/14/16   Okey Regal, PA-C  oxyCODONE-acetaminophen (PERCOCET/ROXICET) 5-325 MG tablet Take 1 tablet by mouth every 6 (six) hours as needed for severe pain. 03/14/16   Ghina Bittinger, PA-C   BP 120/83 mmHg  Pulse 84  Temp(Src) 98.7 F (37.1 C) (Oral)  Resp 16  SpO2 100%   Physical Exam  Constitutional: She is oriented to person, place, and time. She appears well-developed and well-nourished.  HENT:  Head: Normocephalic and atraumatic.  Eyes: Conjunctivae are normal. Pupils are equal, round, and reactive to light. Right eye exhibits no discharge. Left eye exhibits no discharge. No scleral icterus.  Neck: Normal range of motion. No JVD present. No tracheal deviation present.  Pulmonary/Chest: Effort normal. No stridor.  Abdominal:  Soft. She exhibits no distension and no mass. There is no tenderness. There is no rebound and no guarding.  Neurological: She is alert and oriented to person, place, and time. Coordination normal.  Skin: Skin is warm and dry. No rash noted. No erythema. No pallor.  Psychiatric: She has a normal mood and affect. Her behavior is normal. Judgment and thought content normal.  Nursing note and vitals reviewed.   ED Course  Procedures (including critical care time) Labs Review Labs Reviewed  CBC WITH DIFFERENTIAL/PLATELET - Abnormal; Notable for  the following:    WBC 11.8 (*)    Platelets 494 (*)    All other components within normal limits  URINALYSIS, ROUTINE W REFLEX MICROSCOPIC (NOT AT [facility]) - Abnormal; Notable for the following:    Color, Urine RED (*)    APPearance CLOUDY (*)    Hgb urine dipstick LARGE (*)    Protein, ur 100 (*)    Leukocytes, UA MODERATE (*)    All other components within normal limits  URINE MICROSCOPIC-ADD ON - Abnormal; Notable for the following:    Squamous Epithelial / LPF 6-30 (*)    Bacteria, UA FEW (*)    All other components within normal limits  BASIC METABOLIC PANEL    Imaging Review US Renal  03/14/2016  CLINICAL DATA:  Left flank pain for 2 weeks. Status post stent placement. EXAM: RENAL / URINARY TRACT ULTRASOUND COMPLETE COMPARISON:  CT abdomen and pelvis 03/08/2016 FINDINGS: Right Kidney: Length: 11.5 cm. Echogenicity within normal limits. No mass or hydronephrosis visualized. Left Kidney: Length: 11.3 cm, within normal limits. Echogenicity within normal limits. No mass or hydronephrosis visualized. A simple cyst at the upper pole measures 1.1 cm maximally. The stent is visualized. The collecting system is decompressed. Bladder: The distal aspect of the stent is visualized. IMPRESSION: 1. Double-J ureteral stent on the left without residual or recurrent hydronephrosis. 2. 1.1 cm simple cyst at the upper pole of the left kidney. Electronically Signed    By: San Morelle M.D.   On: 03/14/2016 14:29   I have personally reviewed and evaluated these images and lab results as part of my medical decision-making.   EKG Interpretation None      MDM   Final diagnoses:  Flank pain    Labs: Urinalysis, CBC, BMP  Imaging:  Consults:  Therapeutics:  Discharge Meds:   Assessment/Plan:  40 year old female presents today with left-sided flank pain. Patient had stent placed several days ago, continued pain with worsening over the last 2 days. Patient does not appear to be significantly ill, she is nontoxic, and does not appear to be in significant distress. Patient's labs are significant for continued urinary tract infection, elevated WBC of 11.8 ( improved from previous hospital admission) patient received renal ultrasound to assess obstruction, and urology consult.  Spoke with urologist, he instructed me to have patient discharged home with follow-up in office as scheduled tomorrow. Pain likely due to stent, patient has improving labs but continues to have urinary tract infection. No need for further evaluation here in the ED.         Okey Regal, PA-C 03/15/16 1711  Harvel Quale, MD 03/28/16 [phone]

## 2016-03-15 DIAGNOSIS — R1084 Generalized abdominal pain: Secondary | ICD-10-CM | POA: Diagnosis not present

## 2016-03-15 DIAGNOSIS — N201 Calculus of ureter: Secondary | ICD-10-CM | POA: Diagnosis not present

## 2016-03-16 ENCOUNTER — Other Ambulatory Visit: Payer: Self-pay | Admitting: Urology

## 2016-03-18 ENCOUNTER — Other Ambulatory Visit (HOSPITAL_COMMUNITY): Payer: Self-pay

## 2016-03-18 MED ORDER — BUPROPION HCL ER (XL) 150 MG PO TB24: 150.0000 mg | ORAL_TABLET | ORAL | Status: DC

## 2016-03-22 ENCOUNTER — Other Ambulatory Visit (HOSPITAL_COMMUNITY): Payer: Self-pay

## 2016-03-22 ENCOUNTER — Encounter (HOSPITAL_BASED_OUTPATIENT_CLINIC_OR_DEPARTMENT_OTHER): Payer: Self-pay | Admitting: *Deleted

## 2016-03-22 ENCOUNTER — Ambulatory Visit (HOSPITAL_COMMUNITY): Payer: Self-pay | Admitting: Clinical

## 2016-03-22 DIAGNOSIS — F319 Bipolar disorder, unspecified: Secondary | ICD-10-CM

## 2016-03-22 MED ORDER — LORAZEPAM 1 MG PO TABS: 1.0000 mg | ORAL_TABLET | Freq: Two times a day (BID) | ORAL | Status: DC

## 2016-03-22 MED ORDER — BUPROPION HCL ER (XL) 150 MG PO TB24: 150.0000 mg | ORAL_TABLET | ORAL | Status: DC

## 2016-03-22 MED ORDER — OLANZAPINE 15 MG PO TABS: 15.0000 mg | ORAL_TABLET | Freq: Every day | ORAL | Status: DC

## 2016-03-22 MED ORDER — TRAZODONE HCL 100 MG PO TABS: 100.0000 mg | ORAL_TABLET | Freq: Every day | ORAL | Status: DC

## 2016-03-22 NOTE — Progress Notes (Signed)
NPO AFTER MN.  ARRIVE AT [id number].  CURRENT LAB RESULTS IN CHART AND EPIC.  WILL TAKE AM MEDS W/ SIPS OF WATER DOS AND IF NEEDED TAKE ONE TYPE PAIN RX.

## 2016-03-28 ENCOUNTER — Encounter (HOSPITAL_COMMUNITY): Payer: Self-pay | Admitting: Clinical

## 2016-03-28 ENCOUNTER — Ambulatory Visit (INDEPENDENT_AMBULATORY_CARE_PROVIDER_SITE_OTHER): Payer: Medicare Other | Admitting: Clinical

## 2016-03-28 DIAGNOSIS — F4310 Post-traumatic stress disorder, unspecified: Secondary | ICD-10-CM

## 2016-03-28 DIAGNOSIS — F429 Obsessive-compulsive disorder, unspecified: Secondary | ICD-10-CM | POA: Diagnosis not present

## 2016-03-28 DIAGNOSIS — F3162 Bipolar disorder, current episode mixed, moderate: Secondary | ICD-10-CM

## 2016-03-28 NOTE — Progress Notes (Signed)
THERAPIST PROGRESS NOTE  Session Time: 4:25 - 5:08  Participation Level: Active  Behavioral Response: CasualAlertDepressed  Type of Therapy: Individual Therapy  Treatment Goals addressed: reduce psychiatric symptoms, elevate mood    Interventions: CBT and Motivational Interviewing, grounding and mindfulness techniques, psychoeducation  Summary: Sandra Miller is a 40 y.o. female who presents with Bipolar I, mixed, moderate, and PTSD, and OCD  Suicidal/Homicidal: No -without intent/plan  Therapist Response: Sandra Miller met with clinician for an individual session. Sandra Miller discussed her psychiatric symptoms and her current life events. Sandra Miller recently completed IOP. Client and clinician worked together to review and update her assessment. Sandra Miller reports that she had recently had emergency surgery for a kidney stone that caused septic. She is going to have another surgery this week to have the kidney stone removed, Her mother is going to stay 2 nights to help her recovery. She shared that she recently broke up with her boyfriend of 4 years. She stated that she has been a bit manic - buying thing she does not need and buying groceries filling her fridge but not eating the food. She then throws it out and fills the fridge up again. Client and clinician discussed eating small portions so she will get some nutrient and possibly gain some weight back. Client and clinician reviewed grounding and mindfulness techniques. Client and clinician practiced the techniques together.  Plan: Return again in 1-2 weeks.  Diagnosis: Axis I: Bipolar 1 , mixed, moderate, and PTSD, and OCD    Sandra Miller A, LCSW 03/28/2016

## 2016-03-29 ENCOUNTER — Ambulatory Visit (HOSPITAL_BASED_OUTPATIENT_CLINIC_OR_DEPARTMENT_OTHER): Payer: Medicare Other | Admitting: Anesthesiology

## 2016-03-29 ENCOUNTER — Ambulatory Visit (HOSPITAL_BASED_OUTPATIENT_CLINIC_OR_DEPARTMENT_OTHER)
Admission: RE | Admit: 2016-03-29 | Discharge: 2016-03-29 | Disposition: A | Discharge status: Home / Self Care | Payer: Medicare Other | Source: Ambulatory Visit | Attending: Urology | Admitting: Urology

## 2016-03-29 ENCOUNTER — Encounter (HOSPITAL_BASED_OUTPATIENT_CLINIC_OR_DEPARTMENT_OTHER)
Admission: RE | Disposition: A | Discharge status: Home / Self Care | Payer: Self-pay | Source: Ambulatory Visit | Attending: Urology

## 2016-03-29 ENCOUNTER — Encounter (HOSPITAL_BASED_OUTPATIENT_CLINIC_OR_DEPARTMENT_OTHER): Payer: Self-pay | Admitting: *Deleted

## 2016-03-29 DIAGNOSIS — F419 Anxiety disorder, unspecified: Secondary | ICD-10-CM | POA: Insufficient documentation

## 2016-03-29 DIAGNOSIS — F319 Bipolar disorder, unspecified: Secondary | ICD-10-CM | POA: Diagnosis not present

## 2016-03-29 DIAGNOSIS — K219 Gastro-esophageal reflux disease without esophagitis: Secondary | ICD-10-CM | POA: Insufficient documentation

## 2016-03-29 DIAGNOSIS — F17210 Nicotine dependence, cigarettes, uncomplicated: Secondary | ICD-10-CM | POA: Insufficient documentation

## 2016-03-29 DIAGNOSIS — N201 Calculus of ureter: Secondary | ICD-10-CM | POA: Diagnosis not present

## 2016-03-29 DIAGNOSIS — R1110 Vomiting, unspecified: Secondary | ICD-10-CM | POA: Diagnosis present

## 2016-03-29 DIAGNOSIS — I10 Essential (primary) hypertension: Secondary | ICD-10-CM | POA: Insufficient documentation

## 2016-03-29 DIAGNOSIS — Z79899 Other long term (current) drug therapy: Secondary | ICD-10-CM | POA: Diagnosis not present

## 2016-03-29 DIAGNOSIS — N132 Hydronephrosis with renal and ureteral calculous obstruction: Secondary | ICD-10-CM | POA: Diagnosis not present

## 2016-03-29 DIAGNOSIS — N200 Calculus of kidney: Secondary | ICD-10-CM

## 2016-03-29 HISTORY — DX: Iron deficiency anemia, unspecified: D50.9

## 2016-03-29 HISTORY — PX: CYSTOSCOPY W/ URETERAL STENT PLACEMENT: SHX1429

## 2016-03-29 HISTORY — DX: Post-traumatic stress disorder, unspecified: F43.10

## 2016-03-29 HISTORY — DX: Acute candidiasis of vulva and vagina: B37.31

## 2016-03-29 HISTORY — DX: Restless legs syndrome: G25.81

## 2016-03-29 HISTORY — DX: Obsessive-compulsive disorder, unspecified: F42.9

## 2016-03-29 HISTORY — DX: Candidiasis of vulva and vagina: B37.3

## 2016-03-29 HISTORY — DX: Personal history of diseases of the skin and subcutaneous tissue: Z87.2

## 2016-03-29 HISTORY — DX: Personal history of urinary calculi: Z87.442

## 2016-03-29 HISTORY — DX: Personal history of other diseases of the circulatory system: Z86.79

## 2016-03-29 HISTORY — DX: Alcohol dependence, in remission: F10.21

## 2016-03-29 HISTORY — PX: CYSTOSCOPY/RETROGRADE/URETEROSCOPY/STONE EXTRACTION WITH BASKET: SHX5317

## 2016-03-29 HISTORY — DX: Generalized anxiety disorder: F41.1

## 2016-03-29 HISTORY — DX: Personal history of cervical dysplasia: Z87.410

## 2016-03-29 HISTORY — DX: Urgency of urination: R39.15

## 2016-03-29 HISTORY — DX: Personal history of peptic ulcer disease: Z87.11

## 2016-03-29 HISTORY — DX: Personal history of other diseases of the digestive system: Z87.19

## 2016-03-29 HISTORY — DX: Personal history of other mental and behavioral disorders: Z86.59

## 2016-03-29 HISTORY — DX: Bipolar disorder, unspecified: F31.9

## 2016-03-29 HISTORY — DX: Simple chronic bronchitis: J41.0

## 2016-03-29 HISTORY — DX: Herpesviral infection of urogenital system, unspecified: A60.00

## 2016-03-29 HISTORY — DX: Calculus of ureter: N20.1

## 2016-03-29 HISTORY — DX: Personal history of other infectious and parasitic diseases: Z86.19

## 2016-03-29 HISTORY — DX: Other specified postprocedural states: Z98.890

## 2016-03-29 SURGERY — CYSTOSCOPY, WITH CALCULUS REMOVAL USING BASKET
Anesthesia: General | Site: Renal | Laterality: Left

## 2016-03-29 MED ORDER — ONDANSETRON HCL 4 MG/2ML IJ SOLN
INTRAMUSCULAR | Status: DC | PRN
  Administered 2016-03-29: 4 mg via INTRAVENOUS

## 2016-03-29 MED ORDER — HYDROCODONE-ACETAMINOPHEN 7.5-325 MG PO TABS
1.0000 | ORAL_TABLET | Freq: Once | ORAL | Status: DC | PRN
  Filled 2016-03-29: qty 1

## 2016-03-29 MED ORDER — FENTANYL CITRATE (PF) 100 MCG/2ML IJ SOLN
INTRAMUSCULAR | Status: AC
  Filled 2016-03-29: qty 2

## 2016-03-29 MED ORDER — LACTATED RINGERS IV SOLN
INTRAVENOUS | Status: DC
  Administered 2016-03-29: 11:00:00 via INTRAVENOUS
  Filled 2016-03-29: qty 1000

## 2016-03-29 MED ORDER — OXYCODONE-ACETAMINOPHEN 5-325 MG PO TABS: 1.0000 | ORAL_TABLET | Freq: Four times a day (QID) | ORAL | 0 refills | Status: DC | PRN

## 2016-03-29 MED ORDER — SODIUM CHLORIDE 0.9 % IR SOLN
Status: DC | PRN
  Administered 2016-03-29: 4000 mL

## 2016-03-29 MED ORDER — PROPOFOL 10 MG/ML IV BOLUS
INTRAVENOUS | Status: DC | PRN
  Administered 2016-03-29: 200 mg via INTRAVENOUS

## 2016-03-29 MED ORDER — FENTANYL CITRATE (PF) 100 MCG/2ML IJ SOLN
INTRAMUSCULAR | Status: DC | PRN
  Administered 2016-03-29 (×2): 25 ug via INTRAVENOUS
  Administered 2016-03-29: 50 ug via INTRAVENOUS

## 2016-03-29 MED ORDER — LIDOCAINE HCL (CARDIAC) 20 MG/ML IV SOLN
INTRAVENOUS | Status: AC
  Filled 2016-03-29: qty 5

## 2016-03-29 MED ORDER — MEPERIDINE HCL 25 MG/ML IJ SOLN
6.2500 mg | INTRAMUSCULAR | Status: DC | PRN
  Filled 2016-03-29: qty 1

## 2016-03-29 MED ORDER — PROPOFOL 10 MG/ML IV BOLUS
INTRAVENOUS | Status: AC
  Filled 2016-03-29: qty 20

## 2016-03-29 MED ORDER — HYDROMORPHONE HCL 1 MG/ML IJ SOLN
0.2500 mg | INTRAMUSCULAR | Status: DC | PRN
  Filled 2016-03-29: qty 1

## 2016-03-29 MED ORDER — BELLADONNA ALKALOIDS-OPIUM 16.2-60 MG RE SUPP
RECTAL | Status: AC
Start: 2016-03-29 — End: 2016-03-29
  Filled 2016-03-29: qty 1

## 2016-03-29 MED ORDER — CEPHALEXIN 500 MG PO CAPS: 500.0000 mg | ORAL_CAPSULE | Freq: Three times a day (TID) | ORAL | 0 refills | Status: DC

## 2016-03-29 MED ORDER — ONDANSETRON HCL 4 MG/2ML IJ SOLN
4.0000 mg | Freq: Once | INTRAMUSCULAR | Status: DC | PRN
  Filled 2016-03-29: qty 2

## 2016-03-29 MED ORDER — LIDOCAINE HCL (CARDIAC) 20 MG/ML IV SOLN
INTRAVENOUS | Status: DC | PRN
  Administered 2016-03-29: 100 mg via INTRAVENOUS

## 2016-03-29 MED ORDER — MIDAZOLAM HCL 2 MG/2ML IJ SOLN
INTRAMUSCULAR | Status: AC
  Filled 2016-03-29: qty 2

## 2016-03-29 MED ORDER — CEFAZOLIN IN D5W 1 GM/50ML IV SOLN
1.0000 g | INTRAVENOUS | Status: DC
  Filled 2016-03-29: qty 50

## 2016-03-29 MED ORDER — ONDANSETRON HCL 4 MG/2ML IJ SOLN
INTRAMUSCULAR | Status: AC
  Filled 2016-03-29: qty 2

## 2016-03-29 MED ORDER — MIDAZOLAM HCL 5 MG/5ML IJ SOLN
INTRAMUSCULAR | Status: DC | PRN
  Administered 2016-03-29: 2 mg via INTRAVENOUS

## 2016-03-29 MED ORDER — GENTAMICIN IN SALINE 1.6-0.9 MG/ML-% IV SOLN
80.0000 mg | Freq: Once | INTRAVENOUS | Status: AC
  Administered 2016-03-29: 80 mg via INTRAVENOUS
  Filled 2016-03-29 (×2): qty 50

## 2016-03-29 MED ORDER — CEFAZOLIN SODIUM-DEXTROSE 2-4 GM/100ML-% IV SOLN
2.0000 g | INTRAVENOUS | Status: AC
  Administered 2016-03-29: 2 g via INTRAVENOUS
  Filled 2016-03-29: qty 100

## 2016-03-29 MED ORDER — DEXAMETHASONE SODIUM PHOSPHATE 4 MG/ML IJ SOLN
INTRAMUSCULAR | Status: DC | PRN
  Administered 2016-03-29: 10 mg via INTRAVENOUS

## 2016-03-29 MED ORDER — DEXAMETHASONE SODIUM PHOSPHATE 10 MG/ML IJ SOLN
INTRAMUSCULAR | Status: AC
  Filled 2016-03-29: qty 1

## 2016-03-29 MED ORDER — IOHEXOL 300 MG/ML  SOLN
INTRAMUSCULAR | Status: DC | PRN
  Administered 2016-03-29: 6 mL

## 2016-03-29 MED ORDER — CEFAZOLIN SODIUM-DEXTROSE 2-4 GM/100ML-% IV SOLN
INTRAVENOUS | Status: AC
  Filled 2016-03-29: qty 100

## 2016-03-29 SURGICAL SUPPLY — 27 items
BAG DRAIN URO-CYSTO SKYTR STRL (DRAIN) ×3 IMPLANT
BAG DRN UROCATH (DRAIN) ×2
BASKET ZERO TIP NITINOL 2.4FR (BASKET) ×2 IMPLANT
BSKT STON RTRVL ZERO TP 2.4FR (BASKET) ×2
CATH INTERMIT  6FR 70CM (CATHETERS) ×2 IMPLANT
CATH URET 5FR 28IN OPEN ENDED (CATHETERS) ×3 IMPLANT
CATH URET DUAL LUMEN 6-10FR 50 (CATHETERS) IMPLANT
CLOTH BEACON ORANGE TIMEOUT ST (SAFETY) ×3 IMPLANT
FIBER LASER TRAC TIP (UROLOGICAL SUPPLIES) IMPLANT
GLOVE BIO SURGEON STRL SZ 6.5 (GLOVE) ×2 IMPLANT
GLOVE BIO SURGEON STRL SZ7.5 (GLOVE) ×3 IMPLANT
GLOVE BIOGEL PI IND STRL 6.5 (GLOVE) ×2 IMPLANT
GLOVE BIOGEL PI INDICATOR 6.5 (GLOVE) ×2
GOWN STRL REUS W/ TWL XL LVL3 (GOWN DISPOSABLE) ×2 IMPLANT
GOWN STRL REUS W/TWL XL LVL3 (GOWN DISPOSABLE) ×3
GUIDEWIRE STR DUAL SENSOR (WIRE) ×3 IMPLANT
IV NS 1000ML (IV SOLUTION) ×3
IV NS 1000ML BAXH (IV SOLUTION) ×1 IMPLANT
IV NS IRRIG 3000ML ARTHROMATIC (IV SOLUTION) ×3 IMPLANT
KIT ROOM TURNOVER WOR (KITS) ×3 IMPLANT
MANIFOLD NEPTUNE II (INSTRUMENTS) ×3 IMPLANT
NS IRRIG 500ML POUR BTL (IV SOLUTION) IMPLANT
PACK CYSTOSCOPY (CUSTOM PROCEDURE TRAY) ×3 IMPLANT
SCRUB PCMX 4 OZ (MISCELLANEOUS) ×1 IMPLANT
STENT URET 6FRX26 CONTOUR (STENTS) ×2 IMPLANT
SYRINGE IRR TOOMEY STRL 70CC (SYRINGE) ×1 IMPLANT
TUBE CONNECTING 12X1/4 (SUCTIONS) ×3 IMPLANT

## 2016-03-29 NOTE — Transfer of Care (Signed)
Last Vitals:  Vitals:   03/29/16 1051 03/29/16 1319  BP: 114/76   Pulse: 87   Resp: 16   Temp: 36.9 C (P) 36.6 C    Last Pain:  Vitals:   03/29/16 1051  TempSrc: Oral      Patients Stated Pain Goal: 10 (03/29/16 1107)  Immediate Anesthesia Transfer of Care Note  Patient: Kona C Hallowell  Procedure(s) Performed: Procedure(s) (LRB): CYSTOSCOPY/RETROGRADE/URETEROSCOPY/STONE EXTRACTION WITH BASKET (Left) HOLMIUM LASER APPLICATION (Left) CYSTOSCOPY WITH STENT REPLACEMENT (Left)  Patient Location: PACU  Anesthesia Type: General  Level of Consciousness: awake, alert  and oriented  Airway & Oxygen Therapy: Patient Spontanous Breathing and Patient connected to nasal cannula oxygen  Post-op Assessment: Report given to PACU RN and Post -op Vital signs reviewed and stable  Post vital signs: Reviewed and stable  Complications: No apparent anesthesia complications

## 2016-03-29 NOTE — Interval H&P Note (Signed)
History and Physical Interval Note:  03/29/2016 12:32 PM  Sandra Miller  has presented today for surgery, with the diagnosis of LEFT URETERAL CALCULUS  The various methods of treatment have been discussed with the patient and family. After consideration of risks, benefits and other options for treatment, the patient has consented to  Procedure(s): CYSTOSCOPY/RETROGRADE/URETEROSCOPY/STONE EXTRACTION WITH BASKET (Left) HOLMIUM LASER APPLICATION (Left) CYSTOSCOPY WITH STENT REPLACEMENT (Left) as a surgical intervention .  The patient's history has been reviewed, patient examined, no change in status, stable for surgery.  I have reviewed the patient's chart and labs.  Questions were answered to the patient's satisfaction.    Patient here for definitive stone management after negative urine culture.  Nickie Retort

## 2016-03-29 NOTE — Op Note (Signed)
Date of procedure: 03/29/16  Preoperative diagnosis:  1. Left ureteral calculus   Postoperative diagnosis:  1. Left ureteral calculus   Procedure: 1. Cystoscopy  2. Left ureteroscopy 3. Stone basketing 4. Left retrograde pyelogram with interpretation 5. Left ureteral stent exchange 6 [place] by 26 cm  Surgeon: Baruch Gouty, MD  Anesthesia: General  Complications: None  Intraoperative findings: The patient had a 5 mm stone in the proximal ureter that was grasped with stone basket and removed it medically. Left retrograde pyelogram at the end the procedure showed no further filling defects.  EBL: None  Specimens: Left ureteral stones office lab  Drains: 6 French by 26 7 m left double-J ureteral stent  Disposition: Stable to the postanesthesia care unit  Indication for procedure: The patient is a 40 y.o. female with a left ureteral calculus in the proximal ureter. She presented underwent a stent placed in the setting of sepsis. She presents today for definitive stone management after appropriate treatment of her urine culture.  After reviewing the management options for treatment, the patient elected to proceed with the above surgical procedure(s). We have discussed the potential benefits and risks of the procedure, side effects of the proposed treatment, the likelihood of the patient achieving the goals of the procedure, and any potential problems that might occur during the procedure or recuperation. Informed consent has been obtained.  Description of procedure: The patient was met in the preoperative area. All risks, benefits, and indications of the procedure were described in great detail. The patient consented to the procedure. Preoperative antibiotics were given. The patient was taken to the operative theater. General anesthesia was induced per the anesthesia service. The patient was then placed in the dorsal lithotomy position and prepped and draped in the usual sterile fashion.  A preoperative timeout was called.   A 21 French 30 cystoscope was inserted into the patient's bladder atraumatically per urethra. The left ureteral stent was grasped with flexible graspers and brought to level urethral meatus. A sensor wire was then exchanged through the stent up to level of the left renal pelvis on fluoroscopy. The stent was removed. Left ureteroscopy then took place the semirigid ureteroscope. The stone was located in the proximal ureter. The ureter was wide in caliber from the previous ureteral stent since the stone was easily removed with a stone basket and atraumatically. This was sent to pathology. Pan ureteroscopy was negative for further stone disease. A left retrograde pyelogram was obtained which was unremarkable for filling defects or further stone burden. Over the remaining sensor wire with the aid the cystoscope was 6 [place] by 26 cm double-J ureteral stent was placed. Sensor wire removed. A curl seen in the patient's left renal pelvis. A curl seen in the patient's urinary bladder direct visualization. There was clear drainage of contrast. The patient's bladder was drained. She was then woken from anesthesia and transferred stable condition to postanesthesia care unit.  Plan:  The patient will follow-up in one week for stent removal. She'll need a ultrasound in 1 month's to rely attack hydronephrosis. She is on Topamax so we will need to discuss with her at the time of follow-up the potential role of this medication in causing her stone burden.  Baruch Gouty, M.D.

## 2016-03-29 NOTE — Anesthesia Procedure Notes (Addendum)
Procedure Name: LMA Insertion Date/Time: 03/29/2016 12:48 PM Performed by: Glennon Mac., Cristopher Estimable Pre-anesthesia Checklist: Patient identified, Emergency Drugs available, Suction available and Patient being monitored Patient Re-evaluated:Patient Re-evaluated prior to inductionOxygen Delivery Method: Circle system utilized Preoxygenation: Pre-oxygenation with 100% oxygen Intubation Type: IV induction Ventilation: Mask ventilation without difficulty LMA: LMA inserted LMA Size: 4.0 Number of attempts: 1 Airway Equipment and Method: Bite block Placement Confirmation: positive ETCO2 Tube secured with: Tape Dental Injury: Teeth and Oropharynx as per pre-operative assessment  Comments: Inserted by Wyvonna Plum, CRNA

## 2016-03-29 NOTE — Discharge Instructions (Signed)

## 2016-03-29 NOTE — Anesthesia Postprocedure Evaluation (Signed)
Anesthesia Post Note  Patient: Sandra Miller  Procedure(s) Performed: Procedure(s) (LRB): CYSTOSCOPY/RETROGRADE/URETEROSCOPY/STONE EXTRACTION WITH BASKET (Left) CYSTOSCOPY WITH STENT REPLACEMENT (Left)  Patient location during evaluation: PACU Anesthesia Type: General Level of consciousness: awake and alert and oriented Pain management: pain level controlled Vital Signs Assessment: post-procedure vital signs reviewed and stable Respiratory status: spontaneous breathing, nonlabored ventilation and respiratory function stable Cardiovascular status: blood pressure returned to baseline and stable Postop Assessment: no signs of nausea or vomiting Anesthetic complications: no    Last Vitals:  Vitals:   03/29/16 1330 03/29/16 1345  BP: 122/75 130/77  Pulse: 72   Resp: 18   Temp:      Last Pain:  Vitals:   03/29/16 1319  TempSrc:   PainSc: Asleep                 Naleigha Raimondi A.

## 2016-03-29 NOTE — H&P (View-Only) (Signed)
6:56 PM   Sandra Miller Jul 26, 1976 [id number]  Referring provider: Dr. Orlie Dakin  Chief Complaint  Patient presents with  . Emesis  . Vaginal Bleeding    HPI: The patient is a 40 year old female with a past medical history of nephrolithiasis who presents with concerns for sepsis and an obstructing left ureteral stone. She has not been feeling well for the last few days. She is been febrile up to 103. She also notes vaginal bleeding which is strange for her as she has not had a period of 4 years. CT shows a 6 Milner stone approximately ureter. Her urinalysis is concerning for infection. She has leukocytosis and she is febrile.  She has a history of one stone prior to this. She passed spontaneously.   PMH: Past Medical History  Diagnosis Date  . Anxiety   . Hypertension     history of  . GERD (gastroesophageal reflux disease)   . Hiatal hernia   . HSV   . DISORDER, BIPOLAR NOS   . ALCOHOLISM     history of  . DISORDER, TOBACCO USE   . SUBSTANCE ABUSE     history of  . CONSTIPATION   . Abdominal pain, unspecified site   . EPIGASTRIC PAIN   . HELICOBACTER PYLORI GASTRITIS, HX OF   . NEPHROLITHIASIS, HX OF   . HX, PERSONAL, GENITAL/OBSTETRIC DISORDER NEC   . Esophageal dysphagia     Surgical History: Past Surgical History  Procedure Laterality Date  . Cesarean section  1995  . Knee arthroscopy      left  . Tubal ligation  1995  . [facility]   . Esophagogast[facility]odenoscopy    . Colonoscopy      Home Medications:    Medication List    ASK your doctor about these medications        acetaminophen 500 MG tablet  Commonly known as:  TYLENOL  Take 1,000 mg by mouth every 6 (six) hours as needed for moderate pain or fever.     buPROPion 150 MG 24 hr tablet  Commonly known as:  WELLBUTRIN XL  Take 1 tablet (150 mg total) by mouth every morning.     clindamycin 300 MG capsule  Commonly known as:  CLEOCIN  Take 1 capsule  (300 mg total) by mouth 4 (four) times daily. For 7 days     fluconazole 150 MG tablet  Commonly known as:  DIFLUCAN  Take 1 tablet (150 mg total) by mouth once.     ibuprofen 600 MG tablet  Commonly known as:  ADVIL,MOTRIN  Take 1 tablet (600 mg total) by mouth every 6 (six) hours as needed for moderate pain.     LORazepam 1 MG tablet  Commonly known as:  ATIVAN  Take 1 tablet (1 mg total) by mouth 2 (two) times daily.     multivitamin tablet  Take 1 tablet by mouth daily.     OLANZapine 15 MG tablet  Commonly known as:  ZYPREXA  Take 1 tablet (15 mg total) by mouth at bedtime.     omeprazole 40 MG capsule  Commonly known as:  PRILOSEC  take 1 capsule by mouth twice a day     topiramate 25 MG tablet  Commonly known as:  TOPAMAX  take 2 tablets by mouth at bedtime     traZODone 100 MG tablet  Commonly known as:  DESYREL  Take 1 tablet (100 mg total) by mouth  at bedtime.     valACYclovir 500 MG tablet  Commonly known as:  VALTREX  Take 1 tablet (500 mg total) by mouth daily as needed (break outs).        Allergies:  Allergies  Allergen Reactions  . Aspirin Hives  . Minocycline Hives    Family History: Family History  Problem Relation Age of Onset  . Stomach cancer Paternal Grandfather   . Diabetes Maternal Grandfather   . Diabetes Paternal Grandmother   . Heart disease Father   . Lung cancer Father   . Alcohol abuse Father   . Heart disease Mother   . Depression Mother   . Anxiety disorder Mother   . Drug abuse Brother   . Alcohol abuse Brother   . Kidney disease Maternal Uncle   . Cirrhosis Cousin     alcoholic  . Anxiety disorder Maternal Aunt   . Depression Maternal Aunt   . Colon polyps Brother   . Drug abuse Brother   . ADD / ADHD Brother     Social History:  reports that she has been smoking Cigarettes.  She has a 22 pack-year smoking history. She has never used smokeless tobacco. She reports that she does not drink alcohol or use illicit  drugs.  ROS: 12 point ROS otherwise negative except for HPI                                        Physical Exam: BP 130/78 mmHg  Pulse 94  Temp(Src) 101.5 F (38.6 C) (Oral)  Resp 16  Ht 5\' 5"  (1.651 m)  Wt 168 lb (76.204 kg)  BMI 27.96 kg/m2  SpO2 100%  LMP   Constitutional:  Alert and oriented, No acute distress. HEENT: [place] AT, moist mucus membranes.  Trachea midline, no masses. Cardiovascular: No clubbing, cyanosis, or edema. Respiratory: Normal respiratory effort, no increased work of breathing. GI: Abdomen is soft, nontender, nondistended, no abdominal masses GU: No CVA tenderness.  Skin: No rashes, bruises or suspicious lesions. Lymph: No cervical or inguinal adenopathy. Neurologic: Grossly intact, no focal deficits, moving all 4 extremities. Psychiatric: Normal mood and affect.  Laboratory Data: Lab Results  Component Value Date   WBC 15.8* 03/08/2016   HGB 11.3* 03/08/2016   HCT 33.0* 03/08/2016   MCV 85.9 03/08/2016   PLT 350 03/08/2016    Lab Results  Component Value Date   CREATININE 0.86 03/08/2016    No results found for: PSA  No results found for: TESTOSTERONE  Lab Results  Component Value Date   HGBA1C 5.7 12/05/2014    Urinalysis    Component Value Date/Time   COLORURINE YELLOW 03/08/2016 1550   APPEARANCEUR CLOUDY* 03/08/2016 1550   LABSPEC 1.007 03/08/2016 1550   PHURINE 5.5 03/08/2016 1550   GLUCOSEU NEGATIVE 03/08/2016 1550   HGBUR SMALL* 03/08/2016 1550   BILIRUBINUR NEGATIVE 03/08/2016 1550   BILIRUBINUR NEG 12/05/2014 1342   KETONESUR NEGATIVE 03/08/2016 1550   PROTEINUR NEGATIVE 03/08/2016 1550   PROTEINUR NEG 12/05/2014 1342   UROBILINOGEN 0.2 12/05/2014 1342   UROBILINOGEN 0.2 11/25/2013 1641   NITRITE POSITIVE* 03/08/2016 1550   NITRITE NEG 12/05/2014 1342   LEUKOCYTESUR LARGE* 03/08/2016 1550    Pertinent Imaging: CLINICAL DATA: Vaginal bleeding on Sunday, patient does not have menstrual  periods d/t ablation. Chills starting yesterday and high fevers. Nausea and vomiting. Abdominal pain.  EXAM: CT ABDOMEN AND  PELVIS WITH CONTRAST  TECHNIQUE: Multidetector CT imaging of the abdomen and pelvis was performed using the standard protocol following bolus administration of intravenous contrast.  CONTRAST: 151mL ISOVUE-300 IOPAMIDOL (ISOVUE-300) INJECTION 61%  COMPARISON: CT abdomen dated 11/04/2008  FINDINGS: Lower chest: No acute findings.  Hepatobiliary: 7 mm hypodense focus within the left liver lobe, too small to definitively characterize, favored to be a small cyst. Liver otherwise unremarkable. Gallbladder appears normal. No bile duct dilatation.  Pancreas: No mass, inflammatory changes, or other significant abnormality.  Spleen: Within normal limits in size and appearance.  Adrenals/Urinary Tract: There is an obstructing 6 x 4 mm stone within the proximal left ureter causing moderate left-sided hydronephrosis and perinephric inflammation. There is also dilatation of the proximal left ureter with associated periureteral inflammation. Probable small cyst within the posterior cortex of the left kidney. Right kidney appears normal without stone or hydronephrosis. Bladder is unremarkable.  Stomach/Bowel: Bowel is normal in caliber. No bowel wall thickening or evidence of bowel wall inflammation seen. Appendix is not seen but there are no inflammatory changes about the cecum to suggest acute appendicitis. Stomach appears normal.  Vascular/Lymphatic: Abdominal aorta is normal in caliber. Scattered small and moderate-sized lymph nodes in the left retroperitoneum are likely reactive in nature. No enlarged lymph nodes seen within the intraperitoneal abdomen or pelvis.  Reproductive: Uterus and adnexal structures are unremarkable.  Other: No intraperitoneal free fluid. No abscess collection. No free intraperitoneal air.  Musculoskeletal:  Probable chronic unilateral pars interarticularis defect on the left at L5-S1. Prominent disc desiccation at L5-S1, at least moderate in degree with disc space narrowing, osseous spurring and vacuum disc phenomenon. Remainder of the disc spaces appear well preserved. No acute or suspicious osseous finding. Superficial soft tissues are unremarkable.  IMPRESSION: 1. Obstructing 6 x 4 mm stone within the proximal left ureter causing moderate left-sided hydronephrosis and perinephric inflammation. Superimposed infection/pyelonephritis cannot be excluded, especially concerning given the history of chills and fevers. 2. Chronic/incidental findings detailed above.  Assessment & Plan:    1. Left ureteral stone 2. UTI 3. Probable sepsis I discussed the patient the role for cystoscopy with emergent left ureteral stent placement due to her left renal stone with UTI and signs of sepsis. We discussed the goal today is to unobstruct her left collecting system. We will not attempt to the manipulate the stone as it could make her symptoms worse. She understands the risks, benefits, and indications of this procedure. She understands the risks include but are not limited to bleeding, infection, need for repeat procedures, need for interventional radiology placement of nephrostomy tube along others. All questions were answered. She is agreeable to proceeding.  Nickie Retort, MD

## 2016-03-29 NOTE — Anesthesia Preprocedure Evaluation (Addendum)
Anesthesia Evaluation  Patient identified by MRN, date of birth, ID band Patient awake    Reviewed: Allergy & Precautions, NPO status , Patient's Chart, lab work & pertinent test results  Airway Mallampati: II  TM Distance: >3 FB Neck ROM: Full    Dental no notable dental hx. (+) Teeth Intact   Pulmonary Current Smoker,    Pulmonary exam normal breath sounds clear to auscultation       Cardiovascular negative cardio ROS Normal cardiovascular exam Rhythm:Regular Rate:Normal     Neuro/Psych PSYCHIATRIC DISORDERS Bipolar Disorder PTSD OCD Hx/o Panic attacksRestless legs syndrome  Neuromuscular disease    GI/Hepatic hiatal hernia, GERD  Medicated and Controlled,(+)     substance abuse  alcohol use, Recovering Alcoholic   Endo/Other    Renal/GU Renal diseaseLeft ureteral calculus Bladder dysfunction  Frequency    Musculoskeletal negative musculoskeletal ROS (+)   Abdominal Normal abdominal exam  (+)   Peds  Hematology  (+) anemia ,   Anesthesia Other Findings   Reproductive/Obstetrics Genital condyloma HSV                             Anesthesia Physical Anesthesia Plan  ASA: II  Anesthesia Plan: General   Post-op Pain Management:    Induction: Intravenous  Airway Management Planned: LMA  Additional Equipment:   Intra-op Plan:   Post-operative Plan: Extubation in OR  Informed Consent: I have reviewed the patients History and Physical, chart, labs and discussed the procedure including the risks, benefits and alternatives for the proposed anesthesia with the patient or authorized representative who has indicated his/her understanding and acceptance.   Dental advisory given  Plan Discussed with: Anesthesiologist, CRNA and Surgeon  Anesthesia Plan Comments:         Anesthesia Quick Evaluation

## 2016-03-30 ENCOUNTER — Encounter (HOSPITAL_BASED_OUTPATIENT_CLINIC_OR_DEPARTMENT_OTHER): Payer: Self-pay | Admitting: Urology

## 2016-03-31 ENCOUNTER — Ambulatory Visit (HOSPITAL_COMMUNITY): Payer: Self-pay | Admitting: Psychiatry

## 2016-04-01 ENCOUNTER — Ambulatory Visit (INDEPENDENT_AMBULATORY_CARE_PROVIDER_SITE_OTHER): Payer: Medicare Other | Admitting: Family Medicine

## 2016-04-01 VITALS — BP 102/70 | HR 90 | Temp 98.3°F | Ht 65.0 in | Wt 174.0 lb

## 2016-04-01 DIAGNOSIS — N938 Other specified abnormal uterine and vaginal bleeding: Secondary | ICD-10-CM

## 2016-04-01 DIAGNOSIS — Z111 Encounter for screening for respiratory tuberculosis: Secondary | ICD-10-CM | POA: Diagnosis not present

## 2016-04-01 DIAGNOSIS — D649 Anemia, unspecified: Secondary | ICD-10-CM | POA: Diagnosis not present

## 2016-04-01 DIAGNOSIS — N111 Chronic obstructive pyelonephritis: Secondary | ICD-10-CM | POA: Diagnosis present

## 2016-04-01 DIAGNOSIS — E876 Hypokalemia: Secondary | ICD-10-CM | POA: Diagnosis not present

## 2016-04-01 DIAGNOSIS — G2581 Restless legs syndrome: Secondary | ICD-10-CM | POA: Diagnosis not present

## 2016-04-01 LAB — BASIC METABOLIC PANEL WITH GFR
BUN: 6 mg/dL — ABNORMAL LOW (ref 7–25)
CALCIUM: 9.5 mg/dL (ref 8.6–10.2)
CO2: 22 mmol/L (ref 20–31)
CREATININE: 0.79 mg/dL (ref 0.50–1.10)
Chloride: 108 mmol/L (ref 98–110)
Glucose, Bld: 88 mg/dL (ref 65–99)
Potassium: 3.4 mmol/L — ABNORMAL LOW (ref 3.5–5.3)
SODIUM: 140 mmol/L (ref 135–146)

## 2016-04-01 MED ORDER — FERROUS SULFATE 325 (65 FE) MG PO TABS: 325.0000 mg | ORAL_TABLET | Freq: Every day | ORAL | 2 refills | Status: DC

## 2016-04-01 MED ORDER — GABAPENTIN 300 MG PO CAPS: 300.0000 mg | ORAL_CAPSULE | Freq: Every day | ORAL | 3 refills | Status: DC

## 2016-04-01 NOTE — Assessment & Plan Note (Signed)
Continue iron daily Repeat testing in 2-3 months

## 2016-04-01 NOTE — Assessment & Plan Note (Signed)
Could be related to recent fluid shifts and electrolyte disturbances or iron deficiency Try gabapentin 300 mg daily at bedtime Follow-up in 2 months

## 2016-04-01 NOTE — Progress Notes (Signed)
   Subjective:   Sandra Miller is a 40 y.o. female with a history of Bipolar disorder here for hospital f/u for uretral stone with pyelonephritis  Pyelo, nephrolithiasis Patient hospitalized 7/4-7/6 for L ureteral stone with pyelo and sepsis S/p urologic surgery for stone removal and stent replacement 3 days ago Having L lower abd pain intermittently since surgery Urinating well but does have urgency - was told this was to be expected Getting relief from narcotic that was prescribed by urology Hematuria has cleared No more fevers  Restless legs when laying down to bed Trazodone doesn't even help sleep Walking and massaging helps Worse since hospitalization  DUB - vaginal bleeding requiring pad x2 days while hospitalized - has stopped since - had endometrial ablation ~2011 with no periods since   Review of Systems:  Per HPI.   Social History: Current smoker  Objective:  BP 102/70 (BP Location: Right Arm, Patient Position: Sitting, Cuff Size: Normal)   Pulse 90   Temp 98.3 F (36.8 C) (Oral)   Ht 5\' 5"  (1.651 m)   Wt 174 lb (78.9 kg)   SpO2 98%   BMI 28.96 kg/m   Gen:  40 y.o. female in NAD HEENT: NCAT, MMM, EOMI, PERRL, anicteric sclerae CV: RRR, no MRG Resp: Non-labored, CTAB, no wheezes noted Abd: Soft, Mild tenderness in lower quadrants, ND, BS present, no guarding or organomegaly Ext: WWP, no edema MSK: No obvious deformities, gait intact Neuro: Alert and oriented, speech normal, sensation intact in lower extremities to light touch      Chemistry      Component Value Date/Time   NA 138 03/14/2016 1254   K 3.8 03/14/2016 1254   CL 106 03/14/2016 1254   CO2 25 03/14/2016 1254   BUN 12 03/14/2016 1254   CREATININE 0.76 03/14/2016 1254   CREATININE 0.72 09/08/2015 1114      Component Value Date/Time   CALCIUM 9.8 03/14/2016 1254   ALKPHOS 46 03/08/2016 1516   AST 18 03/08/2016 1516   ALT 17 03/08/2016 1516   BILITOT 1.2 03/08/2016 1516      Lab  Results  Component Value Date   WBC 11.8 (H) 03/14/2016   HGB 12.1 03/14/2016   HCT 36.6 03/14/2016   MCV 86.9 03/14/2016   PLT 494 (H) 03/14/2016   Lab Results  Component Value Date   TSH 0.50 12/24/2015   Lab Results  Component Value Date   HGBA1C 5.7 12/05/2014   Assessment & Plan:     Sandra Miller is a 40 y.o. female here for   Obstructive pyelonephritis Infection seems to have resolved without further fevers or dysuria Followed by urology for obstructing nephrolithiasis Stent in place and plan to remove next week  Hypokalemia Check bmet  Normocytic anemia Continue iron daily Repeat testing in 2-3 months  Restless leg syndrome Could be related to recent fluid shifts and electrolyte disturbances or iron deficiency Try gabapentin 300 mg daily at bedtime Follow-up in 2 months  Dysfunctional uterine bleeding Concern for possible endometrial hyperplasia after ablation many years ago Get transvaginal and pelvic ultrasounds in about one month after she is healed from urologic surgery Follow-up in [facility] women[facility] clinic in about 6 weeks and consider endometrial biopsy     Virginia Crews, MD MPH PGY-3,  [facility] 04/01/2016  5:28 PM

## 2016-04-01 NOTE — Patient Instructions (Signed)
Nice to see you again today. Continue follow with urology as scheduled. We will recheck her potassium today and someone will call you or send you a letter with the results when it's available. Continue taking your iron for the next 2 or 3 months until we follow-up. This could make him constipated see you can take a stool softener as needed. Try the gabapentin 300 mg daily at bedtime for restless leg symptoms. This could make you sleepy in the morning so be careful with driving.  We will get an ultrasound of your pelvis to evaluate the lining of your uterus for this bleeding in about 4 weeks. I like you to follow-up in the colposcopy clinic and our clinic in about 6 weeks.  Come back to see me in 2 or 3 months about the restless legs and the iron.  Take care, Dr. Jacinto Reap

## 2016-04-01 NOTE — Assessment & Plan Note (Signed)
Infection seems to have resolved without further fevers or dysuria Followed by urology for obstructing nephrolithiasis Stent in place and plan to remove next week
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
## 2016-04-01 NOTE — Assessment & Plan Note (Signed)
Check bmet 

## 2016-04-01 NOTE — Assessment & Plan Note (Signed)
Concern for possible endometrial hyperplasia after ablation many years ago Get transvaginal and pelvic ultrasounds in about one month after she is healed from urologic surgery Follow-up in [facility] women[facility] health clinic in about 6 weeks and consider endometrial biopsy

## 2016-04-04 ENCOUNTER — Ambulatory Visit (INDEPENDENT_AMBULATORY_CARE_PROVIDER_SITE_OTHER): Payer: Medicare Other | Admitting: *Deleted

## 2016-04-04 ENCOUNTER — Encounter: Payer: Self-pay | Admitting: *Deleted

## 2016-04-04 DIAGNOSIS — N201 Calculus of ureter: Secondary | ICD-10-CM | POA: Diagnosis not present

## 2016-04-04 DIAGNOSIS — Z7689 Persons encountering health services in other specified circumstances: Secondary | ICD-10-CM

## 2016-04-04 DIAGNOSIS — Z111 Encounter for screening for respiratory tuberculosis: Secondary | ICD-10-CM

## 2016-04-04 LAB — TB SKIN TEST
INDURATION: 0 mm
TB SKIN TEST: NEGATIVE

## 2016-04-04 NOTE — Progress Notes (Signed)
   PPD Reading Note PPD read and results entered in [employer]. Result: 0 mm induration. Interpretation: Negative If test not read within 48-72 hours of initial placement, patient advised to repeat in other arm 1-3 weeks after this test. Allergic reaction: no  Martin, Tamika L, RN  

## 2016-04-05 ENCOUNTER — Encounter: Payer: Self-pay | Admitting: Family Medicine

## 2016-04-06 ENCOUNTER — Ambulatory Visit (HOSPITAL_COMMUNITY): Payer: Medicare Other

## 2016-04-11 ENCOUNTER — Ambulatory Visit (HOSPITAL_COMMUNITY): Payer: Medicare Other

## 2016-04-11 ENCOUNTER — Other Ambulatory Visit: Payer: Self-pay | Admitting: Family Medicine

## 2016-04-13 ENCOUNTER — Ambulatory Visit (HOSPITAL_COMMUNITY): Payer: Self-pay | Admitting: Clinical

## 2016-04-20 ENCOUNTER — Ambulatory Visit (HOSPITAL_COMMUNITY): Payer: Self-pay | Admitting: Clinical

## 2016-04-25 ENCOUNTER — Ambulatory Visit (HOSPITAL_COMMUNITY)
Admission: RE | Admit: 2016-04-25 | Discharge: 2016-04-25 | Disposition: A | Discharge status: Home / Self Care | Payer: Medicare Other | Source: Ambulatory Visit | Attending: Family Medicine | Admitting: Family Medicine

## 2016-04-25 DIAGNOSIS — N938 Other specified abnormal uterine and vaginal bleeding: Secondary | ICD-10-CM | POA: Diagnosis not present

## 2016-04-27 ENCOUNTER — Ambulatory Visit (INDEPENDENT_AMBULATORY_CARE_PROVIDER_SITE_OTHER): Payer: Medicare Other | Admitting: Clinical

## 2016-04-27 DIAGNOSIS — F4310 Post-traumatic stress disorder, unspecified: Secondary | ICD-10-CM

## 2016-04-27 DIAGNOSIS — F3162 Bipolar disorder, current episode mixed, moderate: Secondary | ICD-10-CM

## 2016-04-27 DIAGNOSIS — F429 Obsessive-compulsive disorder, unspecified: Secondary | ICD-10-CM | POA: Diagnosis not present

## 2016-04-27 NOTE — Progress Notes (Signed)
   THERAPIST PROGRESS NOTE  Session Time: 4:30 - 5:26  Participation Level: Active  Behavioral Response: CasualAlertAnxious and Depressed  Type of Therapy: Individual Therapy  Treatment Goals addressed: reduce psychiatric symptoms, elevate mood , improve unhelpful thought patterns,    Interventions: CBT and Motivational Interviewing, grounding and mindfulness techniques, psychoeducation  Summary: Sandra Miller is a 40 y.o. female who presents with Bipolar I, mixed, moderate, and PTSD, and OCD  Suicidal/Homicidal: No -without intent/plan  Therapist Response: Zorianna met with clinician for an individual session. Latrisha discussed her psychiatric symptoms and  her current life events. Natarsha shared that she was "nutting up". She shared that she felt like her medication needs to be adjusted. Clinician encouraged her to speak to doctor about her medication.  She shared that she was feeling very anxious and depressed. She has also been experiencing some mania. She shared that her mother was temporarily staying with her  Because she had surgery on her spine. She stated that she would just be there for a few weeks and then move back to her sister's house but she felt overwhelmed. Clinician asked open ended questions and Maayan shared about the things she felt were overwhelming her. Clinician asked open ended questions and Asuka was able to identify some steps she could take to improve her situation. Jynesis and clinician discussed the thought emotion connection. Judiann shared about her ruminating thoughts. Client and clinician discussed and practiced how to interrupt these thoughts. Client and clinician discussed how to refocus her attention. Lyra denied any suicidal or homicidal ideation. She stated she knew she could go to the emergency room if he symptoms became unmanageable.  Plan: Return again in 1-2 weeks.  Diagnosis: Axis I: Bipolar 1 , mixed, moderate, and PTSD, and  OCD   Lahna Nath A, LCSW 04/27/2016

## 2016-05-02 DIAGNOSIS — N201 Calculus of ureter: Secondary | ICD-10-CM | POA: Diagnosis not present

## 2016-05-04 ENCOUNTER — Encounter (HOSPITAL_COMMUNITY): Payer: Self-pay | Admitting: Clinical

## 2016-05-04 ENCOUNTER — Ambulatory Visit (HOSPITAL_COMMUNITY): Payer: Self-pay | Admitting: Clinical

## 2016-05-08 ENCOUNTER — Other Ambulatory Visit: Payer: Self-pay | Admitting: Family Medicine

## 2016-05-11 DIAGNOSIS — Z23 Encounter for immunization: Secondary | ICD-10-CM | POA: Diagnosis not present

## 2016-05-12 ENCOUNTER — Ambulatory Visit: Payer: Self-pay

## 2016-05-12 ENCOUNTER — Ambulatory Visit: Payer: Self-pay | Admitting: *Deleted

## 2016-05-24 ENCOUNTER — Ambulatory Visit (HOSPITAL_COMMUNITY): Payer: Self-pay | Admitting: Psychiatry

## 2016-06-06 ENCOUNTER — Other Ambulatory Visit (HOSPITAL_COMMUNITY): Payer: Self-pay | Admitting: Psychiatry

## 2016-06-06 ENCOUNTER — Other Ambulatory Visit (HOSPITAL_COMMUNITY): Payer: Self-pay

## 2016-06-06 DIAGNOSIS — F319 Bipolar disorder, unspecified: Secondary | ICD-10-CM

## 2016-06-06 MED ORDER — BUPROPION HCL ER (XL) 150 MG PO TB24: 150.0000 mg | ORAL_TABLET | ORAL | 2 refills | Status: DC

## 2016-06-06 MED ORDER — LORAZEPAM 1 MG PO TABS: 1.0000 mg | ORAL_TABLET | Freq: Two times a day (BID) | ORAL | 2 refills | Status: DC

## 2016-06-06 MED ORDER — OLANZAPINE 15 MG PO TABS: 15.0000 mg | ORAL_TABLET | Freq: Every day | ORAL | 2 refills | Status: DC

## 2016-06-06 MED ORDER — TOPIRAMATE 25 MG PO TABS: 50.0000 mg | ORAL_TABLET | Freq: Every day | ORAL | 2 refills | Status: DC

## 2016-06-17 ENCOUNTER — Other Ambulatory Visit: Payer: Self-pay | Admitting: Family Medicine

## 2016-06-19 ENCOUNTER — Other Ambulatory Visit (HOSPITAL_COMMUNITY): Payer: Self-pay | Admitting: Psychiatry

## 2016-06-19 DIAGNOSIS — F319 Bipolar disorder, unspecified: Secondary | ICD-10-CM

## 2016-07-04 ENCOUNTER — Encounter: Payer: Self-pay | Admitting: Family Medicine

## 2016-07-04 ENCOUNTER — Ambulatory Visit (INDEPENDENT_AMBULATORY_CARE_PROVIDER_SITE_OTHER): Payer: Medicare Other | Admitting: Family Medicine

## 2016-07-04 VITALS — BP 143/80 | HR 96 | Temp 98.5°F | Ht 65.0 in | Wt 194.6 lb

## 2016-07-04 DIAGNOSIS — B009 Herpesviral infection, unspecified: Secondary | ICD-10-CM | POA: Diagnosis not present

## 2016-07-04 DIAGNOSIS — A084 Viral intestinal infection, unspecified: Secondary | ICD-10-CM | POA: Diagnosis not present

## 2016-07-04 MED ORDER — ONDANSETRON HCL 4 MG PO TABS: 4.0000 mg | ORAL_TABLET | Freq: Three times a day (TID) | ORAL | 0 refills | Status: DC | PRN

## 2016-07-04 NOTE — Patient Instructions (Addendum)
Sent in nausea medicine (zofran) Follow up on Wednesday if not feeling better  Drink plenty of liquids  Be well, Dr. Ardelia Mems    Viral Gastroenteritis Viral gastroenteritis is also known as stomach flu. This condition affects the stomach and intestinal tract. It can cause sudden diarrhea and vomiting. The illness typically lasts 3 to 8 days. Most people develop an immune response that eventually gets rid of the virus. While this natural response develops, the virus can make you quite ill. CAUSES  Many different viruses can cause gastroenteritis, such as rotavirus or noroviruses. You can catch one of these viruses by consuming contaminated food or water. You may also catch a virus by sharing utensils or other personal items with an infected person or by touching a contaminated surface. SYMPTOMS  The most common symptoms are diarrhea and vomiting. These problems can cause a severe loss of body fluids (dehydration) and a body salt (electrolyte) imbalance. Other symptoms may include:  Fever.  Headache.  Fatigue.  Abdominal pain. DIAGNOSIS  Your caregiver can usually diagnose viral gastroenteritis based on your symptoms and a physical exam. A stool sample may also be taken to test for the presence of viruses or other infections. TREATMENT  This illness typically goes away on its own. Treatments are aimed at rehydration. The most serious cases of viral gastroenteritis involve vomiting so severely that you are not able to keep fluids down. In these cases, fluids must be given through an intravenous line (IV). HOME CARE INSTRUCTIONS   Drink enough fluids to keep your urine clear or pale yellow. Drink small amounts of fluids frequently and increase the amounts as tolerated.  Ask your caregiver for specific rehydration instructions.  Avoid:  Foods high in sugar.  Alcohol.  Carbonated drinks.  Tobacco.  Juice.  Caffeine drinks.  Extremely hot or cold fluids.  Fatty, greasy  foods.  Too much intake of anything at one time.  Dairy products until 24 to 48 hours after diarrhea stops.  You may consume probiotics. Probiotics are active cultures of beneficial bacteria. They may lessen the amount and number of diarrheal stools in adults. Probiotics can be found in yogurt with active cultures and in supplements.  Wash your hands well to avoid spreading the virus.  Only take over-the-counter or prescription medicines for pain, discomfort, or fever as directed by your caregiver. Do not give aspirin to children. Antidiarrheal medicines are not recommended.  Ask your caregiver if you should continue to take your regular prescribed and over-the-counter medicines.  Keep all follow-up appointments as directed by your caregiver. SEEK IMMEDIATE MEDICAL CARE IF:   You are unable to keep fluids down.  You do not urinate at least once every 6 to 8 hours.  You develop shortness of breath.  You notice blood in your stool or vomit. This may look like coffee grounds.  You have abdominal pain that increases or is concentrated in one small area (localized).  You have persistent vomiting or diarrhea.  You have a fever.  The patient is a child younger than 3 months, and he or she has a fever.  The patient is a child older than 3 months, and he or she has a fever and persistent symptoms.  The patient is a child older than 3 months, and he or she has a fever and symptoms suddenly get worse.  The patient is a baby, and he or she has no tears when crying. MAKE SURE YOU:   Understand these instructions.  Will watch  your condition.  Will get help right away if you are not doing well or get worse.   This information is not intended to replace advice given to you by your health care provider. Make sure you discuss any questions you have with your health care provider.   Document Released: 08/22/2005 Document Revised: 11/14/2011 Document Reviewed: 06/08/2011 Elsevier  Interactive Patient Education [employer].

## 2016-07-04 NOTE — Progress Notes (Signed)
Date of Visit: 07/04/2016   HPI:  Patient presents for a same day appointment to discuss vomiting and diarrhea.  Has been sick since Friday. Having vomiting and diarrhea. Abdomen "burns all over". No blood in vomitus or stool. 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 No fevers. Has not tried any medications for this. Not eating much but has kept gatorade down this morning. Was sent home from work on both Saturday and Sunday. Overall not getting better. Has been more anxious while sick (has history of panic disorder). No suicidal ideation/homicidal ideation.  Denies possibility of pregnancy - has had uterine ablation and tubal ligation.   ROS: See HPI  [place]: history of DUB, anemia, RLS, bipolar disorder, tobacco abuse, GERD, nephrolithiasis, UTI, hidradenitis, genital warts, HSV  PHYSICAL EXAM: BP (!) 143/80   Pulse 96   Temp 98.5 F (36.9 C) (Oral)   Ht 5\' 5"  (1.651 m)   Wt 194 lb 9.6 oz (88.3 kg)   BMI 32.38 kg/m  Gen: NAD, pleasant, cooperative HEENT: normocephalic, atraumatic. Moist mucous membranes. No anterior cervical or supraclavicular lymphadenopathy.  Heart: regular rate and rhythm, no murmur Lungs: clear to auscultation bilaterally, normal work of breathing  Abdomen: normoactive bowel sounds. Soft, nontender to palpation, no masses or organomegaly. No peritoneal signs Neuro: alert, grossly nonfocal, speech normal Extremities: brisk capillary refill  ASSESSMENT/PLAN:  Viral gastroenteritis Symptoms & exam consistent with viral gastroenteritis. Currently well-hydrated with benign abdominal exam. Recommend supportive care with fluids. Will rx zofran as anti-emetic (reviewed last EKG with normal QTc). Work note given to be out of work until Wednesday of this week. If not better by then, to be seen again. Return precautions discussed. Follow up as needed if symptoms worsen or fail to improve.    Herpes simplex virus (HSV)  infection Discussed this briefly with patient today - she takes 500mg  daily of valtrex for chronic suppression. Has frequent outbreaks if not on this daily suppressive regimen.    FOLLOW UP: Follow up as needed if symptoms worsen or fail to improve.    [place]. Ardelia Mems, [place]

## 2016-07-04 NOTE — Assessment & Plan Note (Signed)
Discussed this briefly with patient today - she takes 500mg  daily of valtrex for chronic suppression. Has frequent outbreaks if not on this daily suppressive regimen.

## 2016-07-28 ENCOUNTER — Other Ambulatory Visit: Payer: Self-pay | Admitting: Family Medicine

## 2016-08-01 ENCOUNTER — Other Ambulatory Visit: Payer: Self-pay | Admitting: Family Medicine

## 2016-08-04 ENCOUNTER — Other Ambulatory Visit: Payer: Self-pay | Admitting: Internal Medicine

## 2016-08-04 ENCOUNTER — Ambulatory Visit (INDEPENDENT_AMBULATORY_CARE_PROVIDER_SITE_OTHER): Payer: Medicare Other | Admitting: Psychiatry

## 2016-08-04 ENCOUNTER — Encounter (HOSPITAL_COMMUNITY): Payer: Self-pay | Admitting: Psychiatry

## 2016-08-04 DIAGNOSIS — Z79899 Other long term (current) drug therapy: Secondary | ICD-10-CM

## 2016-08-04 DIAGNOSIS — F319 Bipolar disorder, unspecified: Secondary | ICD-10-CM | POA: Diagnosis not present

## 2016-08-04 MED ORDER — BUPROPION HCL ER (XL) 300 MG PO TB24: 300.0000 mg | ORAL_TABLET | ORAL | 2 refills | Status: DC

## 2016-08-04 MED ORDER — LORAZEPAM 1 MG PO TABS: 1.0000 mg | ORAL_TABLET | Freq: Two times a day (BID) | ORAL | 2 refills | Status: DC

## 2016-08-04 MED ORDER — OLANZAPINE 15 MG PO TABS: 15.0000 mg | ORAL_TABLET | Freq: Every day | ORAL | 2 refills | Status: DC

## 2016-08-04 NOTE — Progress Notes (Signed)
[facility] [phone] Progress Note  Sandra Miller [id number] 40 y.o.  08/04/2016 3:03 PM  Chief Complaint  Patient presents with  . Follow-up  . Depression  . Anxiety    History of Present Illness: Sandra Miller came for her follow-up appointment.  She was last seen in June and recommended her to intensive outpatient program.  She finished the program.  Her medicines were changed and now she is taking Wellbutrin.  Her Prozac was stopped.  She is concerned about her physical health.  She's been twice in the emergency room for kidney pain and she was found to have kidney stones which were removed.  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  She admitted some time job is very stressful but she like to continue work for money.  She feels proud that she is not smoking marijuana.  She denies any aggressive behavior.  She had a quiet Thanksgiving with her boyfriend's family member.  She was disappointed because children didn't calm but she was pleased that they had a good time with her own family.  She is not able to see Sandra Miller because of the insurance reason.  She is hoping once she has resources that she can restart counseling.  She denies any feeling of hopelessness or worthlessness.  Since increase Zyprexa she sleeping better.  She denied any paranoia, hallucination or any suicidal thoughts.  Her appetite is fair.  She's pleased that her blood pressure is under control.  She has no tremors shakes or any EPS.  Her energy level is good.  She lives with her boyfriend who is very supportive.  Suicidal Ideation: No Plan Formed: No Patient has means to carry out plan: No  Homicidal Ideation: No Plan Formed: No Patient has means to carry out plan: No  Review of Systems  Constitutional: Negative.   HENT: Negative.   Eyes: Negative.   Respiratory: Negative.   Cardiovascular: Negative.   Gastrointestinal: Negative for  heartburn.  Musculoskeletal: Negative.   Skin: Negative.   Neurological: Positive for headaches.  Psychiatric/Behavioral: Positive for depression. The patient is nervous/anxious.     Psychiatric: Agitation: No Hallucination: No Depressed Mood: Yes Insomnia: No Hypersomnia: No Altered Concentration: No Feels Worthless: No Grandiose Ideas: No Belief In Special Powers: No New/Increased Substance Abuse: No Compulsions: No  Neurologic: Headache: Yes Seizure: No Paresthesias: No  Medical history. Patient has history of hypertension , migraine headache and GERD.  She was seen emergency room a few times for kidney pain and found to have kidney stones.  Outpatient Encounter Prescriptions as of 08/04/2016  Medication Sig Dispense Refill  . acetaminophen (TYLENOL) 500 MG tablet Take 1,000 mg by mouth every 6 (six) hours as needed for moderate pain or fever.    Marland Kitchen buPROPion (WELLBUTRIN XL) 300 MG 24 hr tablet Take 1 tablet (300 mg total) by mouth every morning. 30 tablet 2  . docusate sodium (COLACE) 100 MG capsule Take 1 capsule (100 mg total) by mouth 2 (two) times daily. (Patient taking differently: Take 100 mg by mouth every morning. ) 10 capsule 0  . gabapentin (NEURONTIN) 300 MG capsule TAKE 1 CAPSULE BY MOUTH EVERY NIGHT AT BEDTIME 30 capsule 0  . LORazepam (ATIVAN) 1 MG tablet Take 1 tablet (1 mg total) by mouth 2 (two) times daily. 60 tablet 2  . Multiple Vitamin (MULTIVITAMIN) tablet Take 1 tablet by mouth daily.    Marland Kitchen OLANZapine (ZYPREXA) 15 MG  tablet Take 1 tablet (15 mg total) by mouth at bedtime. 30 tablet 2  . omeprazole (PRILOSEC) 40 MG capsule take 1 capsule by mouth twice a day 60 capsule 11  . traZODone (DESYREL) 100 MG tablet Take 1 tablet (100 mg total) by mouth at bedtime. 30 tablet 1  . valACYclovir (VALTREX) 500 MG tablet TAKE 1 TABLET BY MOUTH DAILY AS NEEDED FOR BREAKOUTS 30 tablet 0  . [DISCONTINUED] buPROPion (WELLBUTRIN XL) 150 MG 24 hr tablet Take 1 tablet (150  mg total) by mouth every morning. 30 tablet 2  . [DISCONTINUED] LORazepam (ATIVAN) 1 MG tablet Take 1 tablet (1 mg total) by mouth 2 (two) times daily. 60 tablet 2  . [DISCONTINUED] OLANZapine (ZYPREXA) 15 MG tablet Take 1 tablet (15 mg total) by mouth at bedtime. 30 tablet 2  . [DISCONTINUED] topiramate (TOPAMAX) 25 MG tablet Take 2 tablets (50 mg total) by mouth at bedtime. 60 tablet 2  . ferrous sulfate (FERROUSUL) 325 (65 FE) MG tablet Take 1 tablet (325 mg total) by mouth daily with breakfast. (Patient not taking: Reported on 08/04/2016) 30 tablet 2  . ondansetron (ZOFRAN) 4 MG tablet Take 1 tablet (4 mg total) by mouth every 8 (eight) hours as needed for nausea or vomiting. (Patient not taking: Reported on 08/04/2016) 20 tablet 0   No facility-administered encounter medications on file as of 08/04/2016.     Past Psychiatric History/Hospitalization(s): Patient has history of bipolar disorder, alcohol dependence and marijuana abuse.  She has seen in this office on and off since 2008.  She is at [facility] psychiatric hospitalization.  She's been admitted in behavioral [facility] in October 2008.  In the past she had tried lithium, Seroquel, Lamictal, Paxil, Celexa, Depakote, Neurontin and Cymbalta with limited response.   Anxiety: Yes Bipolar Disorder: Yes Depression: Yes Mania: Yes Psychosis: Yes Schizophrenia: No Personality Disorder: No Hospitalization for psychiatric illness: Yes History of Electroconvulsive Shock Therapy: No Prior Suicide Attempts: No  Physical Exam: Constitutional:  BP 126/82 (BP Location: Left Arm, Patient Position: Sitting, Cuff Size: Normal)   Pulse 96   Ht 5' 5.5" (1.664 m)   Wt 199 lb 9.6 oz (90.5 kg)   BMI 32.71 kg/m   No results found for this or any previous visit (from the past 2160 hour(s)). General Appearance: alert, oriented, no acute distress and well nourished,   Musculoskeletal: Strength & Muscle Tone: within normal limits Gait & Station:  normal Patient leans: N/A  Mental status examination Patient is casually dressed and groomed.  She maintained good eye contact.  She described her mood anxious and her affect is constricted.  She denies any active or passive suicidal thoughts or homicidal thought.  Her speech is slow, clear, coherent , spontaneous with normal tone and volume.  There were no delusions or any obsessive thoughts.  There were no paranoia and her thinking is clear and organized.  Thought processes logical and goal-directed.  Her fund of knowledge is adequate.  Her psychomotor activity is normal.  Her attention concentration is good.  Her cognition is good and she is alert and oriented 3.  She has no tremors, shakes or any EPS.  Her insight judgment and impulse control is okay.  Established Problem, Stable/Improving (1), New problem, with additional work up planned, Review of Psycho-Social Stressors (1), Review or order clinical lab tests (1), Review and summation of old records (2), Established Problem, Worsening (2), Review of Last Therapy Session (1), Review of Medication Regimen & Side  Effects (2) and Review of New Medication or Change in Dosage (2)  Assessment: Axis I: Bipolar disorder NOS.    Axis II: Deferred  Axis III:  Patient Active Problem List   Diagnosis Date Noted  . Restless leg syndrome 04/01/2016  . Nephrolithiasis 03/08/2016  . Dysfunctional uterine bleeding 03/08/2016  . Obstructive pyelonephritis 03/08/2016  . Hypokalemia 03/08/2016  . Normocytic anemia 03/08/2016  . UTI (lower urinary tract infection)   . Healthcare maintenance 12/24/2015  . Hidradenitis suppurativa of left axilla 11/03/2015  . Loss of weight 09/08/2015  . Genital warts 05/20/2014  . GERD (gastroesophageal reflux disease) 12/26/2012  . Tobacco abuse 08/22/2012  . H/O: HTN (hypertension) 03/19/2009  . Herpes simplex virus (HSV) infection 02/03/2007  . DISORDER, BIPOLAR NOS 02/03/2007    Plan:  I reviewed records from  other providers including blood work which was done in July .  Her BUN 6 and creatinine normal.  I reviewed records from intensive outpatient program.  She is taking Wellbutrin XL 150 mg.  Her Prozac was discontinued.  We discuss to stop the Topamax if it may be contributing to renal stone.  She was getting Topamax for her headaches from this office but she like to see a specialist for her headache management.  I would also recommend to increase Wellbutrin to 300 mg to help her anxiety and residual depression.  Discuss psychosocial stressors.  Patient started a new job recently.  I encouraged to see a therapist if her insurance and resources allows.  She will look into that and call us for the schedule therapy appointment.  Discussed medication side effects and benefits.  I will continue Zyprexa 15 mg at bedtime, trazodone 100 mg at bedtime, lorazepam 1 mg twice a day and increased dose of Wellbutrin 300 mg daily.Discussed medication side effects and benefits.  Recommended to call us back if there is any question, concern or worsening of the symptoms.  Discuss safety plan that anytime having active suicidal thoughts or homicidal thoughts and she need to call 911 or go to the local emergency room.  ARFEEN,SYED T., MD 08/04/2016

## 2016-08-05 ENCOUNTER — Other Ambulatory Visit: Payer: Self-pay | Admitting: Internal Medicine

## 2016-08-05 ENCOUNTER — Other Ambulatory Visit: Payer: Self-pay | Admitting: Family Medicine

## 2016-08-08 NOTE — Telephone Encounter (Signed)
Called patient, explained she would need appt to evaluate for BV. Patient stated she would have to call back and schedule appt due to her hectic work schedule. Nat Christen, CMA

## 2016-08-08 NOTE — Telephone Encounter (Signed)
Patient has not been seen for BV in >1 yr.  She will need appt to evaluate for BV or other STDs prior to Rx.  Please discuss with patient.  Virginia Crews, MD, MPH PGY-3,  [facility] 08/08/2016 1:55 PM

## 2016-08-25 ENCOUNTER — Other Ambulatory Visit: Payer: Self-pay | Admitting: Family Medicine

## 2016-08-29 ENCOUNTER — Other Ambulatory Visit (HOSPITAL_COMMUNITY): Payer: Self-pay | Admitting: Psychiatry

## 2016-09-03 ENCOUNTER — Encounter (HOSPITAL_COMMUNITY): Payer: Self-pay | Admitting: *Deleted

## 2016-09-03 ENCOUNTER — Ambulatory Visit (HOSPITAL_COMMUNITY)
Admission: EM | Admit: 2016-09-03 | Discharge: 2016-09-03 | Disposition: A | Discharge status: Home / Self Care | Payer: Medicare Other | Attending: Emergency Medicine | Admitting: Emergency Medicine

## 2016-09-03 DIAGNOSIS — G43819 Other migraine, intractable, without status migrainosus: Secondary | ICD-10-CM | POA: Diagnosis not present

## 2016-09-03 LAB — POCT URINALYSIS DIP (DEVICE)
Bilirubin Urine: NEGATIVE
GLUCOSE, UA: NEGATIVE mg/dL
Ketones, ur: NEGATIVE mg/dL
LEUKOCYTES UA: NEGATIVE
Nitrite: NEGATIVE
PROTEIN: NEGATIVE mg/dL
SPECIFIC GRAVITY, URINE: 1.01 (ref 1.005–1.030)
UROBILINOGEN UA: 0.2 mg/dL (ref 0.0–1.0)
pH: 7 (ref 5.0–8.0)

## 2016-09-03 MED ORDER — DIPHENHYDRAMINE HCL 50 MG/ML IJ SOLN
25.0000 mg | Freq: Once | INTRAMUSCULAR | Status: AC
  Administered 2016-09-03: 25 mg via INTRAMUSCULAR

## 2016-09-03 MED ORDER — DIPHENHYDRAMINE HCL 50 MG/ML IJ SOLN
INTRAMUSCULAR | Status: AC
  Filled 2016-09-03: qty 1

## 2016-09-03 MED ORDER — DEXAMETHASONE SODIUM PHOSPHATE 10 MG/ML IJ SOLN
10.0000 mg | Freq: Once | INTRAMUSCULAR | Status: AC
Start: 2016-09-03 — End: 2016-09-03
  Administered 2016-09-03: 10 mg via INTRAMUSCULAR

## 2016-09-03 MED ORDER — DEXAMETHASONE SODIUM PHOSPHATE 10 MG/ML IJ SOLN
INTRAMUSCULAR | Status: AC
  Filled 2016-09-03: qty 1

## 2016-09-03 MED ORDER — ONDANSETRON HCL 4 MG/2ML IJ SOLN
4.0000 mg | Freq: Once | INTRAMUSCULAR | Status: AC
  Administered 2016-09-03: 4 mg via INTRAMUSCULAR

## 2016-09-03 MED ORDER — ONDANSETRON HCL 4 MG/2ML IJ SOLN
INTRAMUSCULAR | Status: AC
  Filled 2016-09-03: qty 2

## 2016-09-03 NOTE — ED Triage Notes (Signed)
Migraine      Started  Last       Pm         Pt  Reports   Frequent  Urination      X   3  Days        Nausea      Photophobia        As   Well     Pt  Has  A  History   Of  Migraines   Symptoms  Not  releived  By  Tylenol

## 2016-09-03 NOTE — ED Provider Notes (Signed)
CSN: [id number]     Arrival date & time 09/03/16  1231 History   First MD Initiated Contact with Patient 09/03/16 1446     Chief Complaint  Patient presents with  . Migraine   (Consider location/radiation/quality/duration/timing/severity/associated sxs/prior Treatment) 40y.o. female presents with "migrain" X 3 days that is persisitent and worsening in nature Patient reports a history of migraines and states that this feels like her typical migraine with vision isues and nausea. Patient states that she has been taken off her migraine medication due to history of kidney stones. . Condition is acute on chronic in nature. Condition is made better by nothing. Condition is made worse by nothing. Patient denies any treatment prior to there arrival at this facility. Patient is also complaining of an increase in urination with dysuria.        Past Medical History:  Diagnosis Date  . Bipolar 1 disorder ([place])   . GAD (generalized anxiety disorder)   . Genital HSV    currently per pt  no break out 03-22-2016   . GERD (gastroesophageal reflux disease)   . Hiatal hernia   . History of cervical dysplasia    2012 laser ablation  . History of esophageal dilatation    for dysphasia -- x2 dilated  . History of gastric ulcer   . History of Helicobacter pylori infection    remote hx  . History of hidradenitis suppurativa    "gets all over body intermittantly"    . History of hypertension    no issue since stopped drinking alcohol 2014  . History of kidney stones   . History of panic attacks   . Iron deficiency anemia   . Left ureteral stone   . OCD (obsessive compulsive disorder)   . PTSD (post-traumatic stress disorder)   . Recovering alcoholic in remission ([place])    since 2014  . RLS (restless legs syndrome)   . Smokers' cough ([place])   . Urgency of urination   . Yeast infection involving the vagina and surrounding area    secondary to taking antibiotic   Past Surgical History:  Procedure  Laterality Date  . CESAREAN SECTION  1995   w/  Bilateral Tubal Ligation  . COLONOSCOPY  last one 08-09-2013  . CYSTOSCOPY W/ URETERAL STENT PLACEMENT Left 03/29/2016   Procedure: CYSTOSCOPY WITH STENT REPLACEMENT;  Surgeon: Nickie Retort, MD;  Location: [facility];  Service: Urology;  Laterality: Left;  . CYSTOSCOPY WITH RETROGRADE PYELOGRAM, URETEROSCOPY AND STENT PLACEMENT Left 03/08/2016   Procedure: CYSTOSCOPY WITH  LEFT RETROGRADE PYELOGRAM, AND STENT PLACEMENT;  Surgeon: Nickie Retort, MD;  Location: [facility];  Service: Urology;  Laterality: Left;  . CYSTOSCOPY/RETROGRADE/URETEROSCOPY/STONE EXTRACTION WITH BASKET Left 03/29/2016   Procedure: CYSTOSCOPY/RETROGRADE/URETEROSCOPY/STONE EXTRACTION WITH BASKET;  Surgeon: Nickie Retort, MD;  Location: [facility];  Service: Urology;  Laterality: Left;  . ENDOMETRIAL ABLATION W/ NOVASURE  04-01-2010  . ESOPHAGOGASTRODUODENOSCOPY  last one 08-09-2013  . KNEE ARTHROSCOPY Left as teen  . LASER ABLATION OF THE CERVIX  2012 approx  . TRANSTHORACIC ECHOCARDIOGRAM  05-19-2006   lvsf normal, ef 55-65%, there was mild flattening of the interventricular septum during diastoli/  RV size at upper limits normal  . WISDOM TOOTH EXTRACTION  age 74 's   Family History  Problem Relation Age of Onset  . Heart disease Father   . Lung cancer Father   . Alcohol abuse Father   . Heart disease Mother   . Depression  Mother   . Anxiety disorder Mother   . Drug abuse Brother   . Alcohol abuse Brother   . Colon polyps Brother   . Drug abuse Brother   . ADD / ADHD Brother   . Stomach cancer Paternal Grandfather   . Diabetes Maternal Grandfather   . Diabetes Paternal Grandmother   . Kidney disease Maternal Uncle   . Cirrhosis Cousin     alcoholic  . Anxiety disorder Maternal Aunt   . Depression Maternal Aunt    Social History  Substance Use Topics  . Smoking status: Current Every Day Smoker    Packs/day: 1.00     Years: 22.00    Types: Cigarettes  . Smokeless tobacco: Never Used  . Alcohol use No     Comment:  hx alcohollism  in remission since 2014   OB History    No data available     Review of Systems  Allergies  Minocycline and Aspirin  Home Medications   Prior to Admission medications   Medication Sig Start Date End Date Taking? Authorizing Provider  acetaminophen (TYLENOL) 500 MG tablet Take 1,000 mg by mouth every 6 (six) hours as needed for moderate pain or fever.    Historical Provider, MD  buPROPion (WELLBUTRIN XL) 300 MG 24 hr tablet Take 1 tablet (300 mg total) by mouth every morning. 08/04/16 08/04/17  Kathlee Nations, MD  docusate sodium (COLACE) 100 MG capsule Take 1 capsule (100 mg total) by mouth 2 (two) times daily. Patient taking differently: Take 100 mg by mouth every morning.  03/10/16   Donne Hazel, MD  ferrous sulfate (FERROUSUL) 325 (65 FE) MG tablet Take 1 tablet (325 mg total) by mouth daily with breakfast. Patient not taking: Reported on 08/04/2016 04/01/16   Virginia Crews, MD  gabapentin (NEURONTIN) 300 MG capsule TAKE 1 CAPSULE BY MOUTH EVERY NIGHT AT BEDTIME 08/26/16   Archie Patten, MD  LORazepam (ATIVAN) 1 MG tablet Take 1 tablet (1 mg total) by mouth 2 (two) times daily. 08/04/16   Kathlee Nations, MD  Multiple Vitamin (MULTIVITAMIN) tablet Take 1 tablet by mouth daily.    Historical Provider, MD  OLANZapine (ZYPREXA) 15 MG tablet Take 1 tablet (15 mg total) by mouth at bedtime. 08/04/16 08/04/17  Kathlee Nations, MD  omeprazole (PRILOSEC) 40 MG capsule TAKE 1 CAPSULE BY MOUTH TWICE DAILY 08/05/16   Gatha Mayer, MD  ondansetron (ZOFRAN) 4 MG tablet Take 1 tablet (4 mg total) by mouth every 8 (eight) hours as needed for nausea or vomiting. Patient not taking: Reported on 08/04/2016 07/04/16   Leeanne Rio, MD  traZODone (DESYREL) 100 MG tablet Take 1 tablet (100 mg total) by mouth at bedtime. 03/22/16   Kathlee Nations, MD  valACYclovir (VALTREX) 500  MG tablet TAKE 1 TABLET BY MOUTH DAILY AS NEEDED FOR BREAKOUTS 08/02/16   Virginia Crews, MD   Meds Ordered and Administered this Visit   Medications  dexamethasone (DECADRON) injection 10 mg (10 mg Intramuscular Given 09/03/16 1510)  ondansetron (ZOFRAN) injection 4 mg (4 mg Intramuscular Given 09/03/16 1509)  diphenhydrAMINE (BENADRYL) injection 25 mg (25 mg Intramuscular Given 09/03/16 1511)    BP 136/88 (BP Location: Left Arm)   Pulse 97   Temp 98.5 F (36.9 C) (Oral)   Resp 18   SpO2 100%  No data found.   Physical Exam  Urgent Care Course   Clinical Course     Procedures (including critical  care time)  Labs Review Labs Reviewed  POCT URINALYSIS DIP (DEVICE) - Abnormal; Notable for the following:       Result Value   Hgb urine dipstick TRACE (*)    All other components within normal limits  URINALYSIS, DIPSTICK ONLY    Imaging Review No results found.  Patient reports improvement after medication administraion    MDM   1. Other migraine without status migrainosus, intractable       Jacqualine Mau, NP 09/03/16 1547

## 2016-09-06 ENCOUNTER — Other Ambulatory Visit: Payer: Self-pay | Admitting: Internal Medicine

## 2016-09-08 ENCOUNTER — Other Ambulatory Visit (HOSPITAL_COMMUNITY): Payer: Self-pay | Admitting: Psychiatry

## 2016-09-08 ENCOUNTER — Other Ambulatory Visit: Payer: Self-pay | Admitting: Family Medicine

## 2016-09-12 ENCOUNTER — Other Ambulatory Visit (HOSPITAL_COMMUNITY): Payer: Self-pay | Admitting: Psychiatry

## 2016-09-12 DIAGNOSIS — F319 Bipolar disorder, unspecified: Secondary | ICD-10-CM

## 2016-09-12 IMAGING — US US TRANSVAGINAL NON-OB
1 series · 15 of 25 positions shown · non-contrast
Comparison: CT 03/08/2016

CLINICAL DATA: Dysfunctional uterine bleeding. Status post ablation
0440. Prior C-section. Gravida 2 para 2. LMP 4-5 years ago.
Premenopausal.



[Series 1: us transvaginal non-ob · 15 of 52 slices shown]
[im 1/52]
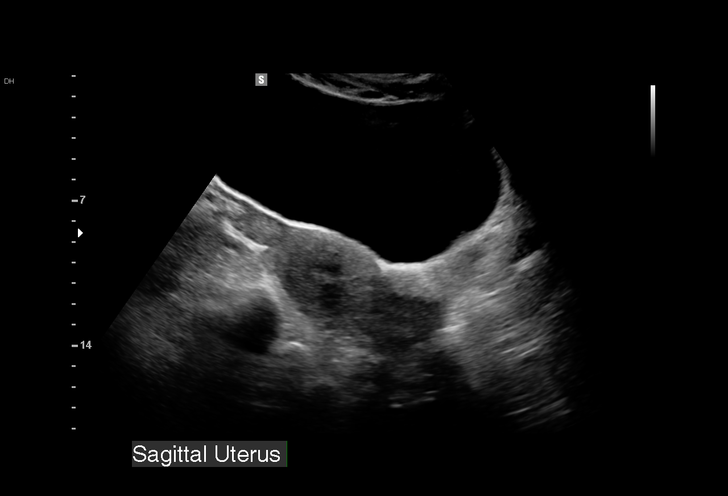
[im 5/52]
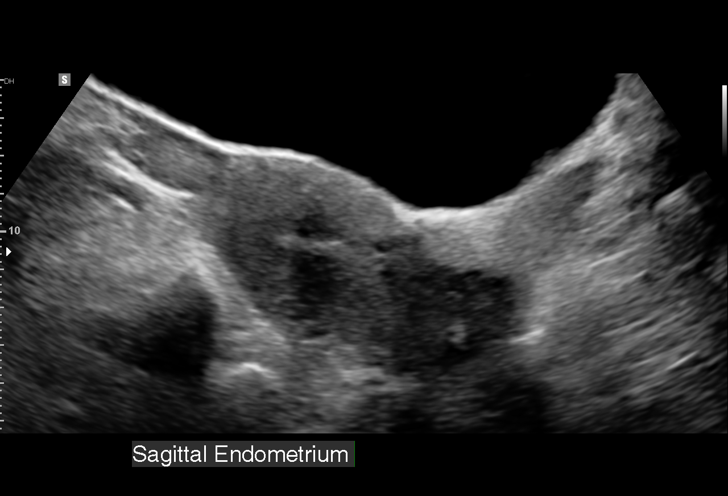
[im 9/52]
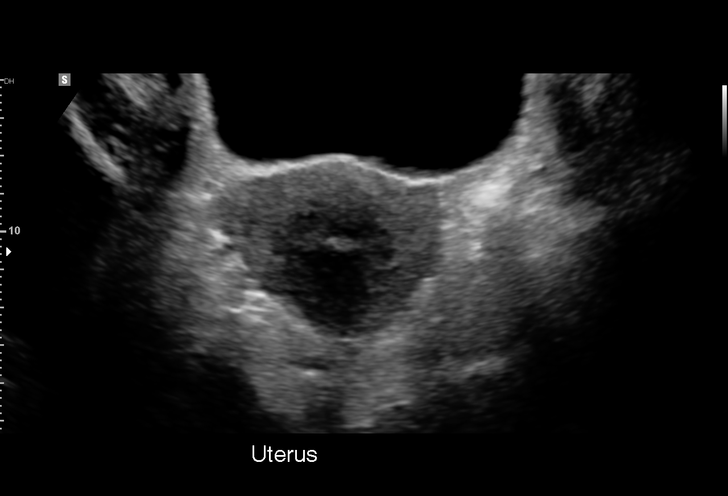
[im 11/52]
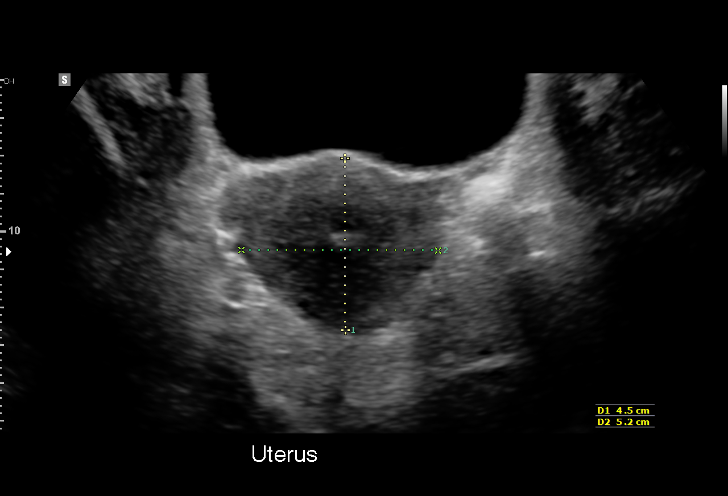
[im 15/52]
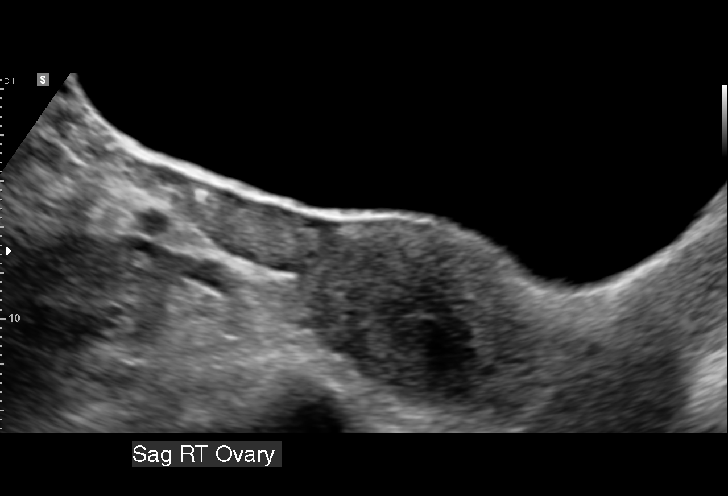
[im 20/52]
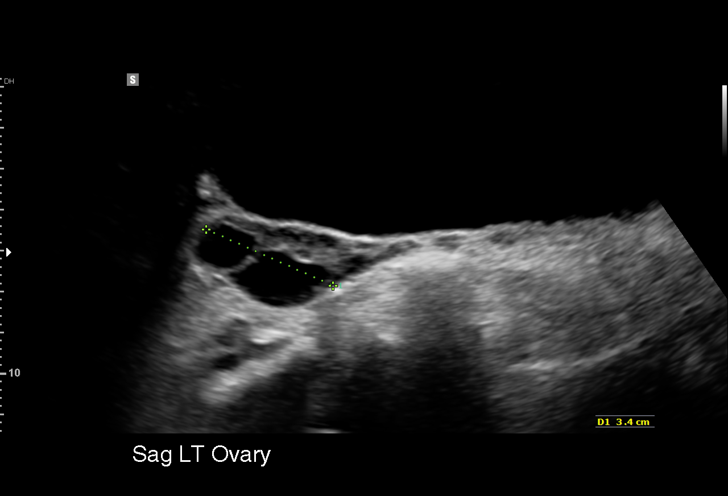
[im 22/52]
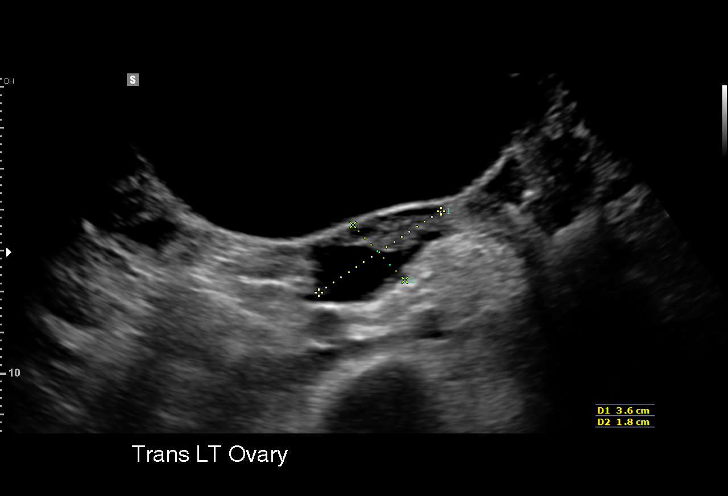
[im 26/52]
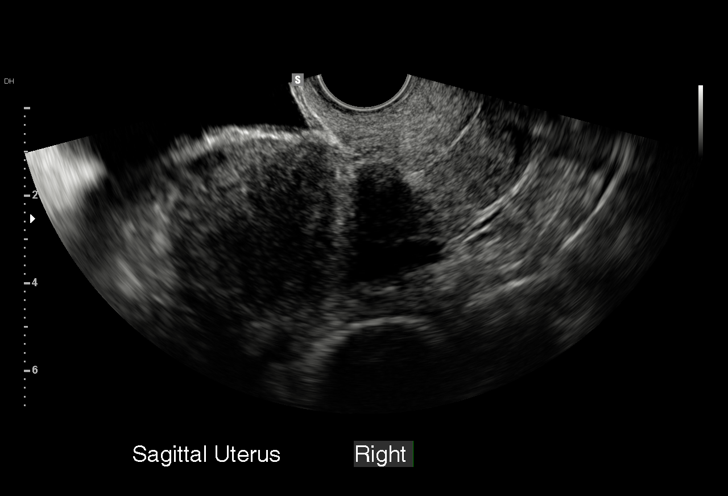
[im 30/52]
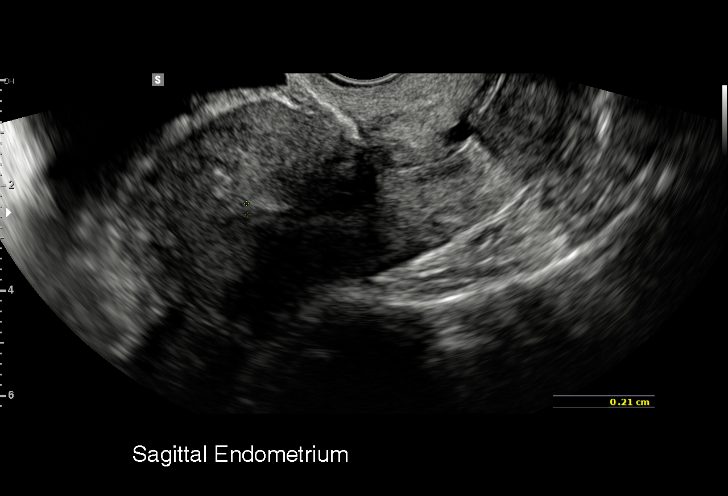
[im 32/52]
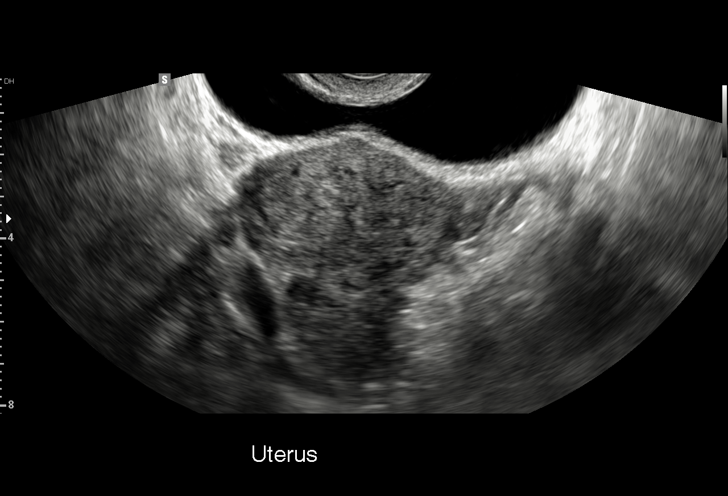
[im 37/52]
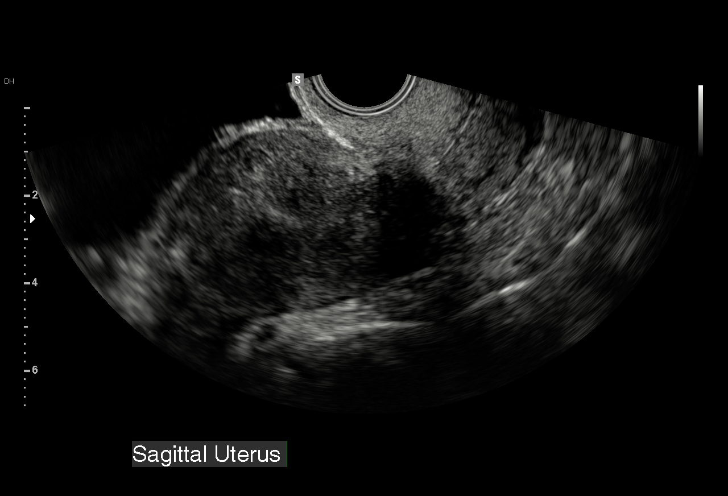
[im 41/52]
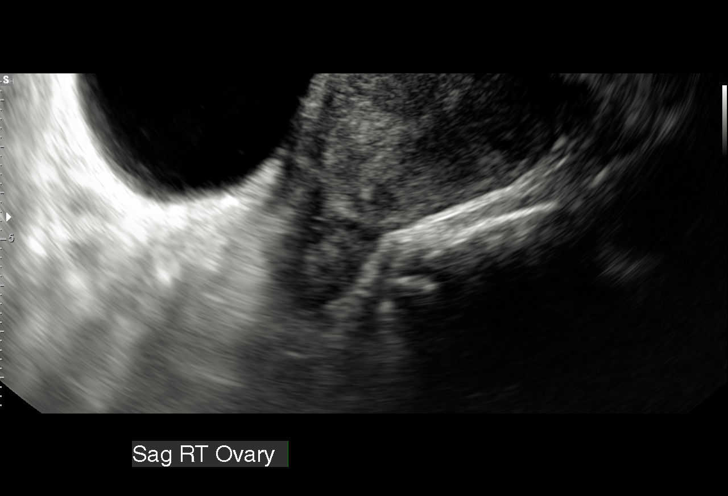
[im 43/52]
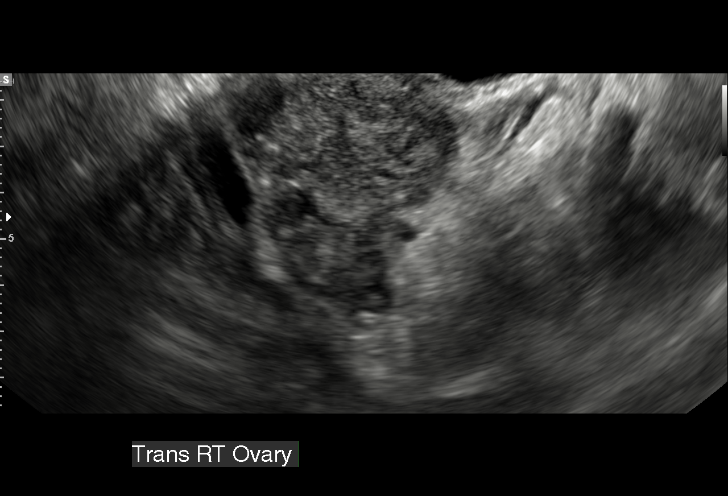
[im 47/52]
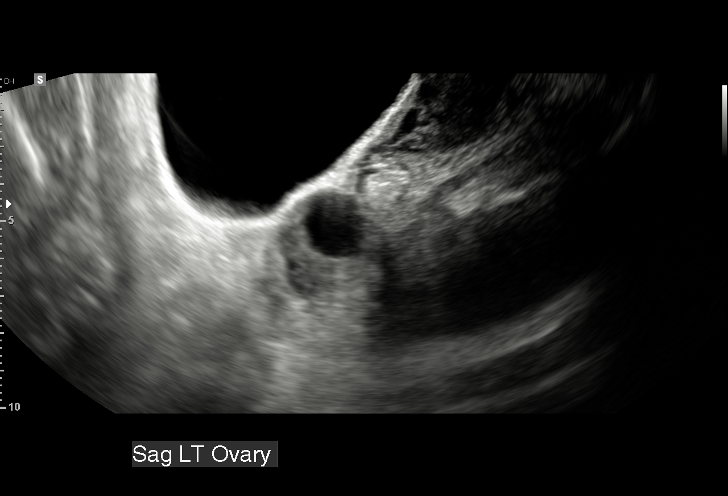
[im 52/52]
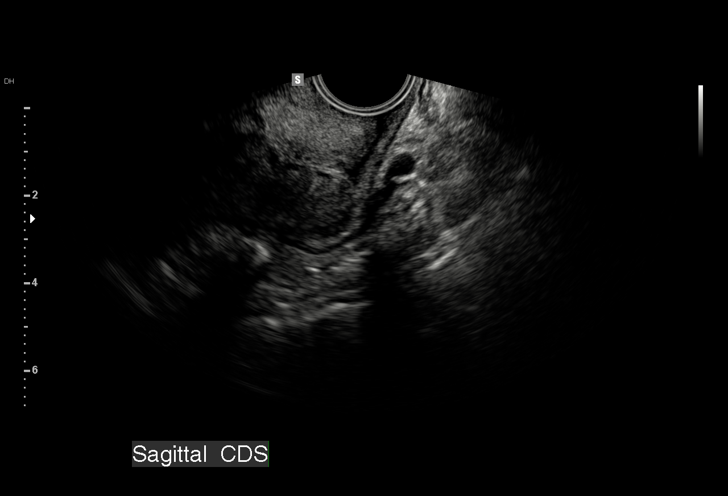

[15 of 25 positions shown; findings below may reference images not displayed]

FINDINGS: Uterus

Measurements: 8.2 x 4.5 x 5.2 cm. No fibroids or other mass
visualized.

Endometrium

Thickness: 2.1 mm. Is partially obscured because of ablation. No
mass identified.

Right ovary

Measurements: 2.3 x 1.8 x 3.2 cm. Normal appearance/no adnexal mass.

Left ovary

Measurements: 3.0 x 2.8 x 3.7 cm. Normal appearance/no adnexal mass.

Other findings

No abnormal free fluid.
IMPRESSION: 1. Visualized uterus/endometrium normal appearance. If bleeding
remains unresponsive to hormonal or medical therapy, sonohysterogram
should be considered for focal lesion work-up. (Ref: Radiological
Reasoning: Algorithmic Workup of Abnormal Vaginal Bleeding with
Endovaginal Sonography and Sonohysterography. AJR [id number]; [id number])
2. No adnexal mass.

## 2016-09-19 ENCOUNTER — Other Ambulatory Visit (HOSPITAL_COMMUNITY): Payer: Self-pay | Admitting: Psychiatry

## 2016-09-19 DIAGNOSIS — F319 Bipolar disorder, unspecified: Secondary | ICD-10-CM

## 2016-09-19 MED ORDER — TRAZODONE HCL 100 MG PO TABS: 100.0000 mg | ORAL_TABLET | Freq: Every day | ORAL | 0 refills | Status: DC

## 2016-09-23 ENCOUNTER — Telehealth (HOSPITAL_COMMUNITY): Payer: Self-pay

## 2016-09-23 ENCOUNTER — Other Ambulatory Visit: Payer: Self-pay | Admitting: Family Medicine

## 2016-09-23 NOTE — Telephone Encounter (Signed)
Medication management - Telephone call with patient after call to [facility] to verify with patient she is taking Wellbutrin XL 300 mg, one a day and not the 150 mg dosage.  Patient agreed and informed she still has refills of this at [facility]. Patient to call back if any problems getting the correct 300 mg dosage filled.

## 2016-10-12 ENCOUNTER — Other Ambulatory Visit: Payer: Self-pay | Admitting: Family Medicine

## 2016-10-17 ENCOUNTER — Other Ambulatory Visit (HOSPITAL_COMMUNITY): Payer: Self-pay | Admitting: Psychiatry

## 2016-10-17 DIAGNOSIS — F319 Bipolar disorder, unspecified: Secondary | ICD-10-CM

## 2016-10-19 ENCOUNTER — Other Ambulatory Visit (HOSPITAL_COMMUNITY): Payer: Self-pay | Admitting: Psychiatry

## 2016-10-19 DIAGNOSIS — F319 Bipolar disorder, unspecified: Secondary | ICD-10-CM

## 2016-10-19 MED ORDER — TRAZODONE HCL 100 MG PO TABS: 100.0000 mg | ORAL_TABLET | Freq: Every day | ORAL | 0 refills | Status: DC

## 2016-10-26 ENCOUNTER — Other Ambulatory Visit (HOSPITAL_COMMUNITY): Payer: Self-pay | Admitting: Psychiatry

## 2016-10-26 DIAGNOSIS — F319 Bipolar disorder, unspecified: Secondary | ICD-10-CM

## 2016-11-01 ENCOUNTER — Encounter (HOSPITAL_COMMUNITY): Payer: Self-pay | Admitting: Psychiatry

## 2016-11-01 ENCOUNTER — Ambulatory Visit (INDEPENDENT_AMBULATORY_CARE_PROVIDER_SITE_OTHER): Payer: Managed Care, Other (non HMO) | Admitting: Psychiatry

## 2016-11-01 VITALS — BP 138/82 | HR 84 | Ht 65.5 in | Wt 217.0 lb

## 2016-11-01 DIAGNOSIS — F17210 Nicotine dependence, cigarettes, uncomplicated: Secondary | ICD-10-CM

## 2016-11-01 DIAGNOSIS — Z811 Family history of alcohol abuse and dependence: Secondary | ICD-10-CM | POA: Diagnosis not present

## 2016-11-01 DIAGNOSIS — F319 Bipolar disorder, unspecified: Secondary | ICD-10-CM

## 2016-11-01 DIAGNOSIS — Z79899 Other long term (current) drug therapy: Secondary | ICD-10-CM

## 2016-11-01 DIAGNOSIS — Z818 Family history of other mental and behavioral disorders: Secondary | ICD-10-CM | POA: Diagnosis not present

## 2016-11-01 DIAGNOSIS — Z888 Allergy status to other drugs, medicaments and biological substances status: Secondary | ICD-10-CM

## 2016-11-01 DIAGNOSIS — Z813 Family history of other psychoactive substance abuse and dependence: Secondary | ICD-10-CM | POA: Diagnosis not present

## 2016-11-01 MED ORDER — LORAZEPAM 1 MG PO TABS: 1.0000 mg | ORAL_TABLET | Freq: Two times a day (BID) | ORAL | 2 refills | Status: DC

## 2016-11-01 MED ORDER — BUPROPION HCL ER (XL) 300 MG PO TB24: 300.0000 mg | ORAL_TABLET | ORAL | 2 refills | Status: DC

## 2016-11-01 MED ORDER — OLANZAPINE 10 MG PO TABS: 10.0000 mg | ORAL_TABLET | Freq: Every day | ORAL | 2 refills | Status: DC

## 2016-11-01 MED ORDER — TOPIRAMATE 50 MG PO TABS
50.0000 mg | ORAL_TABLET | Freq: Every day | ORAL | 2 refills | Status: DC
Start: 2016-11-01 — End: 2017-01-31

## 2016-11-01 NOTE — Progress Notes (Signed)
BH MD/PA/NP OP Progress Note  11/01/2016 3:50 PM Sandra Miller  MRN:  [id number]  Chief Complaint:  Chief Complaint    Follow-up     Subjective:  I'm doing good but I have gained weight.  I'm not happy with my weight gain.  HPI: Sandra Miller came for her follow-up appointment.  She is taking her medication as prescribed and she is pleased that her depression and mania is stable.  She is very unhappy with the weight gain.  She stopped the Topamax because she is keep having kidney stones.  Not she has noticed her migraine headaches are coming back.  Recently she went to the urgent care for migraine headache.  She still have kidney stones and she like to go back on Topamax.  She is working 2 jobs and that seems to be going well.  She denies any agitation, anger, irritability or any panic attack.  She feels proud that she is not smoking marijuana.  She's not involving any impulsive behavior.  She is sleeping better.  She admitted not able to do regular exercise because she is working 2 jobs.  She lives with her boyfriend is supportive.  Her energy level is good.  She has no tremors shakes or any EPS.  Visit Diagnosis:    ICD-9-CM ICD-10-CM   1. Bipolar 1 disorder (HCC) 296.7 F31.9 buPROPion (WELLBUTRIN XL) 300 MG 24 hr tablet     LORazepam (ATIVAN) 1 MG tablet     OLANZapine (ZYPREXA) 10 MG tablet     topiramate (TOPAMAX) 50 MG tablet     COMPLETE METABOLIC PANEL WITH GFR     CBC with Differential/Platelet     Hemoglobin A1c  2. Encounter for long-term (current) use of medications V58.69 [id number] COMPLETE METABOLIC PANEL WITH GFR     CBC with Differential/Platelet     Hemoglobin A1c    Past Psychiatric History: Patient has history of bipolar disorder, alcohol dependence and marijuana abuse.  She has at least 6 psychiatric hospitalization.  In the past she had tried lithium, Seroquel, Lamictal, Paxil, Celexa, Depakote, Neurontin and Cymbalta.  Patient denies any history of suicidal attempt but  endorsed history of aggressive behavior and poor impulse control.  Past Medical History:  Past Medical History:  Diagnosis Date  . Bipolar 1 disorder ([place])   . GAD (generalized anxiety disorder)   . Genital HSV    currently per pt  no break out 03-22-2016   . GERD (gastroesophageal reflux disease)   . Hiatal hernia   . History of cervical dysplasia    2012 laser ablation  . History of esophageal dilatation    for dysphasia -- x2 dilated  . History of gastric ulcer   . History of Helicobacter pylori infection    remote hx  . History of hidradenitis suppurativa    "gets all over body intermittantly"    . History of hypertension    no issue since stopped drinking alcohol 2014  . History of kidney stones   . History of panic attacks   . Iron deficiency anemia   . Left ureteral stone   . OCD (obsessive compulsive disorder)   . PTSD (post-traumatic stress disorder)   . Recovering alcoholic in remission ([place])    since 2014  . RLS (restless legs syndrome)   . Smokers' cough ([place])   . Urgency of urination   . Yeast infection involving the vagina and surrounding area    secondary to taking antibiotic    Past  Surgical History:  Procedure Laterality Date  . CESAREAN SECTION  1995   w/  Bilateral Tubal Ligation  . COLONOSCOPY  last one 08-09-2013  . CYSTOSCOPY W/ URETERAL STENT PLACEMENT Left 03/29/2016   Procedure: CYSTOSCOPY WITH STENT REPLACEMENT;  Surgeon: Nickie Retort, MD;  Location: [facility];  Service: Urology;  Laterality: Left;  . CYSTOSCOPY WITH RETROGRADE PYELOGRAM, URETEROSCOPY AND STENT PLACEMENT Left 03/08/2016   Procedure: CYSTOSCOPY WITH  LEFT RETROGRADE PYELOGRAM, AND STENT PLACEMENT;  Surgeon: Nickie Retort, MD;  Location: [facility];  Service: Urology;  Laterality: Left;  . CYSTOSCOPY/RETROGRADE/URETEROSCOPY/STONE EXTRACTION WITH BASKET Left 03/29/2016   Procedure: CYSTOSCOPY/RETROGRADE/URETEROSCOPY/STONE EXTRACTION WITH BASKET;  Surgeon: Nickie Retort, MD;  Location: [facility];  Service: Urology;  Laterality: Left;  . ENDOMETRIAL ABLATION W/ NOVASURE  04-01-2010  . ESOPHAGOGASTRODUODENOSCOPY  last one 08-09-2013  . KNEE ARTHROSCOPY Left as teen  . LASER ABLATION OF THE CERVIX  2012 approx  . TRANSTHORACIC ECHOCARDIOGRAM  05-19-2006   lvsf normal, ef 55-65%, there was mild flattening of the interventricular septum during diastoli/  RV size at upper limits normal  . WISDOM TOOTH EXTRACTION  age 55 's    Family Psychiatric History: Reviewed.  Family History:  Family History  Problem Relation Age of Onset  . Heart disease Father   . Lung cancer Father   . Alcohol abuse Father   . Heart disease Mother   . Depression Mother   . Anxiety disorder Mother   . Drug abuse Brother   . Alcohol abuse Brother   . Colon polyps Brother   . Drug abuse Brother   . ADD / ADHD Brother   . Stomach cancer Paternal Grandfather   . Diabetes Maternal Grandfather   . Diabetes Paternal Grandmother   . Kidney disease Maternal Uncle   . Cirrhosis Cousin     alcoholic  . Anxiety disorder Maternal Aunt   . Depression Maternal Aunt     Social History:  Social History   Social History  . Marital status: Single    Spouse name: N/A  . Number of children: N/A  . Years of education: N/A   Social History Main Topics  . Smoking status: Current Every Day Smoker    Packs/day: 1.00    Years: 22.00    Types: Cigarettes  . Smokeless tobacco: Never Used     Comment: Starting a smoking cessation class in May, 2018  . Alcohol use No     Comment:  hx alcohollism  in remission since 2014  . Drug use: No     Comment: hx  marijuana use  . Sexual activity: Yes    Partners: Male    Birth control/ protection: Surgical   Other Topics Concern  . None   Social History Narrative   Former Sales promotion account executive patient.      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    In school at [employer] to become Psychologist, sport and exercise.     Disabled for  her bipolar d/o      History of EtOH abuse and THC use.    Allergies:  Allergies  Allergen Reactions  . Depakote [Divalproex Sodium] Nausea And Vomiting  . Minocycline Hives  . Aspirin Hives    Metabolic Disorder Labs: Lab Results  Component Value Date   HGBA1C 5.7 12/05/2014   No results found for: PROLACTIN Lab Results  Component Value Date   CHOL 197 06/13/2008   TRIG 160 (H)  06/13/2008   HDL 42 06/13/2008   CHOLHDL 4.7 Ratio 06/13/2008   VLDL 32 06/13/2008   LDLCALC 123 (H) 06/13/2008     Current Medications: Current Outpatient Prescriptions  Medication Sig Dispense Refill  . acetaminophen (TYLENOL) 500 MG tablet Take 1,000 mg by mouth every 6 (six) hours as needed for moderate pain or fever.    Marland Kitchen buPROPion (WELLBUTRIN XL) 300 MG 24 hr tablet Take 1 tablet (300 mg total) by mouth every morning. 30 tablet 2  . docusate sodium (COLACE) 100 MG capsule Take 1 capsule (100 mg total) by mouth 2 (two) times daily. (Patient taking differently: Take 100 mg by mouth every morning. ) 10 capsule 0  . ferrous sulfate (FERROUSUL) 325 (65 FE) MG tablet Take 1 tablet (325 mg total) by mouth daily with breakfast. 30 tablet 2  . gabapentin (NEURONTIN) 300 MG capsule TAKE 1 CAPSULE BY MOUTH EVERY NIGHT AT BEDTIME 30 capsule 5  . LORazepam (ATIVAN) 1 MG tablet Take 1 tablet (1 mg total) by mouth 2 (two) times daily. 60 tablet 2  . Multiple Vitamin (MULTIVITAMIN) tablet Take 1 tablet by mouth daily.    Marland Kitchen OLANZapine (ZYPREXA) 10 MG tablet Take 1 tablet (10 mg total) by mouth at bedtime. 30 tablet 2  . omeprazole (PRILOSEC) 40 MG capsule TAKE 1 CAPSULE BY MOUTH TWICE DAILY 180 capsule 0  . ondansetron (ZOFRAN) 4 MG tablet Take 1 tablet (4 mg total) by mouth every 8 (eight) hours as needed for nausea or vomiting. 20 tablet 0  . traZODone (DESYREL) 100 MG tablet Take 1 tablet (100 mg total) by mouth at bedtime. 30 tablet 0  . valACYclovir (VALTREX) 500 MG tablet TAKE 1 TABLET BY MOUTH DAILY AS  NEEDED FOR BREAKOUTS 30 tablet 3  . topiramate (TOPAMAX) 50 MG tablet Take 1 tablet (50 mg total) by mouth daily. 30 tablet 2   No current facility-administered medications for this visit.     Neurologic: Headache: Yes Seizure: No Paresthesias: No  Musculoskeletal: Strength & Muscle Tone: within normal limits Gait & Station: normal Patient leans: N/A  Psychiatric Specialty Exam: Review of Systems  Constitutional: Negative for weight loss.  HENT: Negative.   Respiratory: Negative.   Musculoskeletal: Negative.   Skin: Negative.  Negative for itching and rash.  Neurological: Negative.   Psychiatric/Behavioral: The patient is nervous/anxious.     Blood pressure 138/82, pulse 84, height 5' 5.5" (1.664 m), weight 217 lb (98.4 kg), SpO2 98 %.Body mass index is 35.56 kg/m.  General Appearance: Casual  Eye Contact:  Good  Speech:  Clear and Coherent  Volume:  Normal  Mood:  Anxious  Affect:  Appropriate  Thought Process:  Goal Directed  Orientation:  Full (Time, Place, and Person)  Thought Content: WDL and Logical   Suicidal Thoughts:  No  Homicidal Thoughts:  No  Memory:  Immediate;   Good  Judgement:  Good  Insight:  Good  Psychomotor Activity:  Normal  Concentration:  Concentration: Fair and Attention Span: Good  Recall:  Good  Fund of Knowledge: Good  Language: Good  Akathisia:  No  Handed:  Right  AIMS (if indicated):  0  Assets:  Communication Skills Desire for Improvement Housing Resilience Social Support  ADL's:  Intact  Cognition: WNL  Sleep:  Good    Assessment: Bipolar disorder NOS.  Plan: I review her medication and collateral notes from recent visit to the urgent care.  She had gained weight from the past.  She like  to resume Topamax which was helping her migraine headaches and weight.  We discussed in length about [place] ability of kidney stones coming back from the Topamax but patient insists that she wanted to go back on Topamax because increased  weight gain is going to make her more depressed.  I will resume Topamax 50 mg at bedtime.  I will also cut down her Zyprexa to 10 mg to help weight loss.  I encourage to watch her calorie intake and to regular exercise.  I also offered counseling but due to her busy schedule patient at this time not able to commit for therapy.  Discuss in Start[employer] medication side effects and benefits.  Recommended to call us back if she has any question, concern if she feels worsening of the symptom.  Follow-up in 3 months.  Discuss safety plan that anytime having active suicidal thoughts or homicidal thoughts and she need to call 911 or go to the local emergency room.  I will also do blood work including hemoglobin A1c, CBC and basic chemistry.  Monteen Toops T., MD 11/01/2016, 3:50 PM

## 2016-11-10 DIAGNOSIS — F319 Bipolar disorder, unspecified: Secondary | ICD-10-CM | POA: Diagnosis not present

## 2016-11-10 DIAGNOSIS — Z79899 Other long term (current) drug therapy: Secondary | ICD-10-CM | POA: Diagnosis not present

## 2016-11-10 LAB — CBC WITH DIFFERENTIAL/PLATELET
BASOS ABS: 0 {cells}/uL (ref 0–200)
Basophils Relative: 0 %
EOS ABS: 94 {cells}/uL (ref 15–500)
EOS PCT: 1 %
HCT: 39.4 % (ref 35.0–45.0)
Hemoglobin: 13.1 g/dL (ref 11.7–15.5)
LYMPHS PCT: 41 %
Lymphs Abs: 3854 cells/uL (ref 850–3900)
MCH: 29.2 pg (ref 27.0–33.0)
MCHC: 33.2 g/dL (ref 32.0–36.0)
MCV: 87.9 fL (ref 80.0–100.0)
MONOS PCT: 5 %
MPV: 9.1 fL (ref 7.5–12.5)
Monocytes Absolute: 470 cells/uL (ref 200–950)
NEUTROS PCT: 53 %
Neutro Abs: 4982 cells/uL (ref 1500–7800)
PLATELETS: 375 10*3/uL (ref 140–400)
RBC: 4.48 MIL/uL (ref 3.80–5.10)
RDW: 14.3 % (ref 11.0–15.0)
WBC: 9.4 10*3/uL (ref 3.8–10.8)

## 2016-11-10 LAB — COMPLETE METABOLIC PANEL WITH GFR
ALT: 14 U/L (ref 6–29)
AST: 16 U/L (ref 10–30)
Albumin: 4.2 g/dL (ref 3.6–5.1)
Alkaline Phosphatase: 54 U/L (ref 33–115)
BUN: 11 mg/dL (ref 7–25)
CALCIUM: 9.4 mg/dL (ref 8.6–10.2)
CHLORIDE: 106 mmol/L (ref 98–110)
CO2: 20 mmol/L (ref 20–31)
Creat: 1.01 mg/dL (ref 0.50–1.10)
GFR, EST AFRICAN AMERICAN: 79 mL/min (ref >=60)
GFR, Est Non African American: 69 mL/min (ref >=60)
Glucose, Bld: 121 mg/dL — ABNORMAL HIGH (ref 65–99)
Potassium: 3.8 mmol/L (ref 3.5–5.3)
Sodium: 137 mmol/L (ref 135–146)
Total Bilirubin: 0.6 mg/dL (ref 0.2–1.2)
Total Protein: 6.9 g/dL (ref 6.1–8.1)

## 2016-11-10 LAB — HEMOGLOBIN A1C
HEMOGLOBIN A1C: 5.2 % (ref <5.7)
Mean Plasma Glucose: 103 mg/dL

## 2016-11-15 DIAGNOSIS — M25561 Pain in right knee: Secondary | ICD-10-CM | POA: Diagnosis not present

## 2016-11-15 DIAGNOSIS — M2241 Chondromalacia patellae, right knee: Secondary | ICD-10-CM | POA: Diagnosis not present

## 2016-11-15 DIAGNOSIS — M2242 Chondromalacia patellae, left knee: Secondary | ICD-10-CM | POA: Diagnosis not present

## 2016-12-03 ENCOUNTER — Other Ambulatory Visit (HOSPITAL_COMMUNITY): Payer: Self-pay | Admitting: Psychiatry

## 2016-12-03 DIAGNOSIS — F319 Bipolar disorder, unspecified: Secondary | ICD-10-CM

## 2016-12-07 ENCOUNTER — Other Ambulatory Visit: Payer: Self-pay | Admitting: Internal Medicine

## 2016-12-10 ENCOUNTER — Other Ambulatory Visit: Payer: Self-pay | Admitting: Internal Medicine

## 2016-12-19 ENCOUNTER — Telehealth: Payer: Self-pay | Admitting: Internal Medicine

## 2016-12-19 MED ORDER — OMEPRAZOLE 40 MG PO CPDR: 40.0000 mg | DELAYED_RELEASE_CAPSULE | Freq: Two times a day (BID) | ORAL | 0 refills | Status: DC

## 2016-12-19 NOTE — Telephone Encounter (Signed)
Appointment made so omeprazole sent in as requested.

## 2017-01-23 ENCOUNTER — Encounter: Payer: Self-pay | Admitting: Internal Medicine

## 2017-01-23 ENCOUNTER — Ambulatory Visit (INDEPENDENT_AMBULATORY_CARE_PROVIDER_SITE_OTHER): Payer: Medicare Other | Admitting: Internal Medicine

## 2017-01-23 VITALS — BP 100/68 | HR 92 | Ht 65.5 in | Wt 221.1 lb

## 2017-01-23 DIAGNOSIS — M792 Neuralgia and neuritis, unspecified: Secondary | ICD-10-CM

## 2017-01-23 DIAGNOSIS — Z800 Family history of malignant neoplasm of digestive organs: Secondary | ICD-10-CM | POA: Diagnosis not present

## 2017-01-23 DIAGNOSIS — K219 Gastro-esophageal reflux disease without esophagitis: Secondary | ICD-10-CM | POA: Diagnosis not present

## 2017-01-23 DIAGNOSIS — K30 Functional dyspepsia: Secondary | ICD-10-CM

## 2017-01-23 MED ORDER — GABAPENTIN 300 MG PO CAPS: 300.0000 mg | ORAL_CAPSULE | Freq: Three times a day (TID) | ORAL | 5 refills | Status: DC

## 2017-01-23 MED ORDER — OMEPRAZOLE 40 MG PO CPDR: 40.0000 mg | DELAYED_RELEASE_CAPSULE | Freq: Two times a day (BID) | ORAL | 3 refills | Status: DC

## 2017-01-23 NOTE — Patient Instructions (Signed)
We have sent the following medications to your pharmacy for you to pick up at your convenience: Omeprazole, gabapentin   Please MyChart message [place] with how your doing on the increased dose of gabapentin in 6-8 weeks.   We are changing you colon recall to 08/2018 because of your family history.    I appreciate the opportunity to care for you. Silvano Rusk, MD, [facility]

## 2017-01-23 NOTE — Progress Notes (Signed)
   Sandra Miller 41 y.o. 1976-06-30 [id number]  Assessment & Plan:   Encounter Diagnoses  Name Primary?  . Functional dyspepsia Yes  . Neuropathic pain   . Gastroesophageal reflux disease, esophagitis presence not specified   . Family history of colon cancer brother < 53 malignant polyp     Increase gabapentin to 300 tidTo see if that helps relieve this functional dyspepsia and neuropathic-like pain. She will contact me by my chart to let me know if it's not working or it is etc. Follow-up when necessary or annually otherwise. If the gabapentin doesn't work duloxetine might be beneficial but would need to involve psychiatry at that to her regimen I suspect.  Will change colonoscopy recall and will now be in 2019. It sounds like she had a brother with colon cancer in a polyp to the best of our knowledge. She should have an every 5 year colonoscopy.  I appreciate the opportunity to care for this patient. CC: Virginia Crews, MD   Subjective:   Chief Complaint: Follow-up of reflux and dyspepsia  HPI  The patient is here alone today, she has a chronic history of reflux and dyspepsia and IBS. Review see followed by Dr. Sharlett Iles I have seen her once in 2015. She takes PPI therapy, chronically it helps some but she continues to have diffuse abdominal burning pain with or without eating. She sleeps okay but she is on trazodone, and also takes gabapentin at bedtime for restless leg syndrome. Her psychiatrist is suggested she could increase the gabapentin to 3 times a day to help with her other abdominal pain. No dysphagia no bleeding no worrisome features. She does relate that her baby brother had colon cancer in a polyp and questions about her colonoscopy recall timing. Medications, allergies, past medical history, past surgical history, family history and social history are reviewed and updated in the EMR.  Review of Systems As above. 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 She has been they're 8 months.  Objective:   Physical Exam BP 100/68 (BP Location: Left Arm, Patient Position: Sitting, Cuff Size: Normal)   Pulse 92   Ht 5' 5.5" (1.664 m) Comment: height measured without shoes  Wt 221 lb 2 oz (100.3 kg)   BMI 36.24 kg/m  She is well-developed well-nourished in no acute distress Eyes are anicteric Abdomen is soft and nontender and bowel sounds are present She has appropriate mood and affect  Previous GI, primary care notes reviewed.

## 2017-01-31 ENCOUNTER — Other Ambulatory Visit (HOSPITAL_COMMUNITY): Payer: Self-pay

## 2017-01-31 ENCOUNTER — Ambulatory Visit (INDEPENDENT_AMBULATORY_CARE_PROVIDER_SITE_OTHER): Payer: Managed Care, Other (non HMO) | Admitting: Psychiatry

## 2017-01-31 ENCOUNTER — Encounter (HOSPITAL_COMMUNITY): Payer: Self-pay | Admitting: Psychiatry

## 2017-01-31 DIAGNOSIS — Z811 Family history of alcohol abuse and dependence: Secondary | ICD-10-CM | POA: Diagnosis not present

## 2017-01-31 DIAGNOSIS — F17210 Nicotine dependence, cigarettes, uncomplicated: Secondary | ICD-10-CM

## 2017-01-31 DIAGNOSIS — Z813 Family history of other psychoactive substance abuse and dependence: Secondary | ICD-10-CM | POA: Diagnosis not present

## 2017-01-31 DIAGNOSIS — F319 Bipolar disorder, unspecified: Secondary | ICD-10-CM

## 2017-01-31 DIAGNOSIS — Z888 Allergy status to other drugs, medicaments and biological substances status: Secondary | ICD-10-CM | POA: Diagnosis not present

## 2017-01-31 DIAGNOSIS — Z818 Family history of other mental and behavioral disorders: Secondary | ICD-10-CM | POA: Diagnosis not present

## 2017-01-31 DIAGNOSIS — F3130 Bipolar disorder, current episode depressed, mild or moderate severity, unspecified: Secondary | ICD-10-CM | POA: Diagnosis not present

## 2017-01-31 DIAGNOSIS — Z79899 Other long term (current) drug therapy: Secondary | ICD-10-CM

## 2017-01-31 MED ORDER — LORAZEPAM 1 MG PO TABS: 1.0000 mg | ORAL_TABLET | Freq: Two times a day (BID) | ORAL | 2 refills | Status: DC

## 2017-01-31 MED ORDER — TRAZODONE HCL 100 MG PO TABS: 100.0000 mg | ORAL_TABLET | Freq: Every day | ORAL | 0 refills | Status: DC

## 2017-01-31 MED ORDER — TOPIRAMATE 50 MG PO TABS: 50.0000 mg | ORAL_TABLET | Freq: Every day | ORAL | 0 refills | Status: DC

## 2017-01-31 MED ORDER — OLANZAPINE 10 MG PO TABS: 10.0000 mg | ORAL_TABLET | Freq: Every day | ORAL | 2 refills | Status: DC

## 2017-01-31 MED ORDER — BUPROPION HCL ER (XL) 450 MG PO TB24: 450.0000 mg | ORAL_TABLET | ORAL | 1 refills | Status: DC

## 2017-01-31 NOTE — Progress Notes (Signed)
[place] MD/PA/NP OP Progress Note  01/31/2017 4:00 PM Sandra Miller  MRN:  [id number]  Chief Complaint:  Chief Complaint    Follow-up     Subjective:  I am not doing good.  I'm depressed.  I gained weight.  HPI: Sandra Miller came for her follow-up appointment.  She is complaining of sadness and depressed.  She is tired and have no energy.  She has back pain and she is getting physical therapy.  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  She is only working one job.  She denies any panic attack or any aggressive behavior but admitted lack of energy, fatigue, poor attention concentration.  We started her on Topamax on her last visit and her migraine headaches are much better.  But she gained another few pounds.  Recently she's seen GI who recommended to increase gabapentin to help her GI side effects.  She endorsed some time she have nervousness in her stomach.  She is not involved in any impulsive behavior.  She feels proud that she is not smoking marijuana.  She admitted some time stays to herself and does not leave the house unless it is important.  She gets easily tired, overwhelmed because off of the work she has to go to physical therapy.  She lives by herself.  She had blood work and her hemoglobin A1c, chemistry and CBC normal.  Visit Diagnosis:    ICD-9-CM ICD-10-CM   1. Bipolar 1 disorder (HCC) 296.7 F31.9 buPROPion 450 MG TB24     LORazepam (ATIVAN) 1 MG tablet     traZODone (DESYREL) 100 MG tablet     OLANZapine (ZYPREXA) 10 MG tablet    Past Psychiatric History: Reviewed. Patient has history of bipolar disorder, alcohol dependence and marijuana abuse.  She has at least 6 psychiatric hospitalization.  In the past she had tried lithium, Seroquel, Lamictal, Paxil, Celexa, Abilify , Geodon Depakote, Neurontin and Cymbalta.  Patient denies any history of suicidal attempt but endorsed history of aggressive behavior and poor impulse control.  Past Medical  History:  Past Medical History:  Diagnosis Date  . Bipolar 1 disorder ([place])   . GAD (generalized anxiety disorder)   . Genital HSV    currently per pt  no break out 03-22-2016   . GERD (gastroesophageal reflux disease)   . Hiatal hernia   . History of cervical dysplasia    2012 laser ablation  . History of esophageal dilatation    for dysphasia -- x2 dilated  . History of gastric ulcer   . History of Helicobacter pylori infection    remote hx  . History of hidradenitis suppurativa    "gets all over body intermittantly"    . History of hypertension    no issue since stopped drinking alcohol 2014  . History of kidney stones   . History of panic attacks   . Iron deficiency anemia   . Left ureteral stone   . OCD (obsessive compulsive disorder)   . PTSD (post-traumatic stress disorder)   . Recovering alcoholic in remission ([place])    since 2014  . RLS (restless legs syndrome)   . Smokers' cough ([place])   . Urgency of urination   . Yeast infection involving the vagina and surrounding area    secondary to taking antibiotic    Past Surgical History:  Procedure Laterality Date  . CESAREAN SECTION  1995   w/  Bilateral Tubal Ligation  . COLONOSCOPY  last one 08-09-2013  . CYSTOSCOPY W/ URETERAL STENT PLACEMENT Left 03/29/2016   Procedure: CYSTOSCOPY WITH STENT REPLACEMENT;  Surgeon: Nickie Retort, MD;  Location: [facility];  Service: Urology;  Laterality: Left;  . CYSTOSCOPY WITH RETROGRADE PYELOGRAM, URETEROSCOPY AND STENT PLACEMENT Left 03/08/2016   Procedure: CYSTOSCOPY WITH  LEFT RETROGRADE PYELOGRAM, AND STENT PLACEMENT;  Surgeon: Nickie Retort, MD;  Location: [facility];  Service: Urology;  Laterality: Left;  . CYSTOSCOPY/RETROGRADE/URETEROSCOPY/STONE EXTRACTION WITH BASKET Left 03/29/2016   Procedure: CYSTOSCOPY/RETROGRADE/URETEROSCOPY/STONE EXTRACTION WITH BASKET;  Surgeon: Nickie Retort, MD;  Location: [facility];  Service: Urology;   Laterality: Left;  . ENDOMETRIAL ABLATION W/ NOVASURE  04-01-2010  . ESOPHAGOGASTRODUODENOSCOPY  last one 08-09-2013  . KNEE ARTHROSCOPY Left as teen  . LASER ABLATION OF THE CERVIX  2012 approx  . TRANSTHORACIC ECHOCARDIOGRAM  05-19-2006   lvsf normal, ef 55-65%, there was mild flattening of the interventricular septum during diastoli/  RV size at upper limits normal  . WISDOM TOOTH EXTRACTION  age 64 's    Family Psychiatric History: Reviewed.  Family History:  Family History  Problem Relation Age of Onset  . Heart disease Father   . Lung cancer Father   . Alcohol abuse Father   . Heart disease Mother   . Depression Mother   . Anxiety disorder Mother   . Drug abuse Brother   . Alcohol abuse Brother   . Drug abuse Brother   . ADD / ADHD Brother   . Colon cancer Brother        malignant polyp < 60 in 1  . Stomach cancer Paternal Grandfather   . Diabetes Maternal Grandfather   . Diabetes Paternal Grandmother   . Kidney disease Maternal Uncle   . Cirrhosis Cousin        alcoholic  . Anxiety disorder Maternal Aunt   . Depression Maternal Aunt     Social History:  Social History   Social History  . Marital status: Single    Spouse name: N/A  . Number of children: N/A  . Years of education: N/A   Social History Main Topics  . Smoking status: Current Every Day Smoker    Packs/day: 1.00    Years: 22.00    Types: Cigarettes  . Smokeless tobacco: Never Used     Comment: Recently started a smoking cessation class.   . Alcohol use No     Comment:  hx alcohollism  in remission since 2014  . Drug use: No     Comment: hx  marijuana use  . Sexual activity: Yes    Partners: Male    Birth control/ protection: Surgical   Other Topics Concern  . None   Social History Narrative   Former Sales promotion account executive patient.      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    In school at [employer] to become Psychologist, sport and exercise.     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      Last updated   01/23/2017       Allergies:  Allergies  Allergen Reactions  . Depakote [Divalproex Sodium] Nausea And Vomiting  . Minocycline Hives  . Aspirin Hives    Metabolic Disorder Labs: Recent Results (from the past 2160 hour(s))  COMPLETE METABOLIC PANEL WITH GFR  Status: Abnormal   Collection Time: 11/10/16 10:12 AM  Result Value Ref Range   Sodium 137 135 - 146 mmol/L   Potassium 3.8 3.5 - 5.3 mmol/L   Chloride 106 98 - 110 mmol/L   CO2 20 20 - 31 mmol/L   Glucose, Bld 121 (H) 65 - 99 mg/dL   BUN 11 7 - 25 mg/dL   Creat 1.01 0.50 - 1.10 mg/dL   Total Bilirubin 0.6 0.2 - 1.2 mg/dL   Alkaline Phosphatase 54 33 - 115 U/L   AST 16 10 - 30 U/L   ALT 14 6 - 29 U/L   Total Protein 6.9 6.1 - 8.1 g/dL   Albumin 4.2 3.6 - 5.1 g/dL   Calcium 9.4 8.6 - 10.2 mg/dL   GFR, Est African American 79 >=60 mL/min   GFR, Est Non African American 69 >=60 mL/min  CBC with Differential/Platelet     Status: None   Collection Time: 11/10/16 10:12 AM  Result Value Ref Range   WBC 9.4 3.8 - 10.8 K/uL   RBC 4.48 3.80 - 5.10 MIL/uL   Hemoglobin 13.1 11.7 - 15.5 g/dL   HCT 39.4 35.0 - 45.0 %   MCV 87.9 80.0 - 100.0 fL   MCH 29.2 27.0 - 33.0 pg   MCHC 33.2 32.0 - 36.0 g/dL   RDW 14.3 11.0 - 15.0 %   Platelets 375 140 - 400 K/uL   MPV 9.1 7.5 - 12.5 fL   Neutro Abs 4,982 1,500 - 7,800 cells/uL   Lymphs Abs 3,854 850 - 3,900 cells/uL   Monocytes Absolute 470 200 - 950 cells/uL   Eosinophils Absolute 94 15 - 500 cells/uL   Basophils Absolute 0 0 - 200 cells/uL   Neutrophils Relative % 53 %   Lymphocytes Relative 41 %   Monocytes Relative 5 %   Eosinophils Relative 1 %   Basophils Relative 0 %   Smear Review Criteria for review not met   Hemoglobin A1c     Status: None   Collection Time: 11/10/16 10:12 AM  Result Value Ref Range   Hgb A1c MFr Bld 5.2 <5.7 %    Comment:   For the purpose of screening for  the presence of diabetes:   <5.7%       Consistent with the absence of diabetes 5.7-6.4 %   Consistent with increased risk for diabetes (prediabetes) >=6.5 %     Consistent with diabetes   This assay result is consistent with a decreased risk of diabetes.   Currently, no consensus exists regarding use of hemoglobin A1c for diagnosis of diabetes in children.   According to American Diabetes Association (ADA) guidelines, hemoglobin A1c <7.0% represents optimal control in non-pregnant diabetic patients. Different metrics may apply to specific patient populations. Standards of Medical Care in Diabetes (ADA).      Mean Plasma Glucose 103 mg/dL   Lab Results  Component Value Date   HGBA1C 5.2 11/10/2016   MPG 103 11/10/2016   No results found for: PROLACTIN Lab Results  Component Value Date   CHOL 197 06/13/2008   TRIG 160 (H) 06/13/2008   HDL 42 06/13/2008   CHOLHDL 4.7 Ratio 06/13/2008   VLDL 32 06/13/2008   LDLCALC 123 (H) 06/13/2008     Current Medications: Current Outpatient Prescriptions  Medication Sig Dispense Refill  . acetaminophen (TYLENOL) 500 MG tablet Take 1,000 mg by mouth every 6 (six) hours as needed for moderate pain or fever.    Marland Kitchen buPROPion (  WELLBUTRIN XL) 300 MG 24 hr tablet Take 1 tablet (300 mg total) by mouth every morning. 30 tablet 2  . docusate sodium (COLACE) 100 MG capsule Take 1 capsule (100 mg total) by mouth 2 (two) times daily. (Patient taking differently: Take 100 mg by mouth every morning. ) 10 capsule 0  . gabapentin (NEURONTIN) 300 MG capsule Take 1 capsule (300 mg total) by mouth 3 (three) times daily. Bid for first week then tid 90 capsule 5  . LORazepam (ATIVAN) 1 MG tablet Take 1 tablet (1 mg total) by mouth 2 (two) times daily. 60 tablet 2  . Multiple Vitamin (MULTIVITAMIN) tablet Take 1 tablet by mouth daily.    Marland Kitchen OLANZapine (ZYPREXA) 10 MG tablet Take 1 tablet (10 mg total) by mouth at bedtime. 30 tablet 2  . omeprazole (PRILOSEC) 40  MG capsule Take 1 capsule (40 mg total) by mouth 2 (two) times daily. 180 capsule 3  . topiramate (TOPAMAX) 50 MG tablet Take 1 tablet (50 mg total) by mouth daily. 30 tablet 2  . traZODone (DESYREL) 100 MG tablet Take 1 tablet (100 mg total) by mouth at bedtime. 30 tablet 0  . valACYclovir (VALTREX) 500 MG tablet TAKE 1 TABLET BY MOUTH DAILY AS NEEDED FOR BREAKOUTS 30 tablet 3   No current facility-administered medications for this visit.     Neurologic: Headache: No Seizure: No Paresthesias: No  Musculoskeletal: Strength & Muscle Tone: within normal limits Gait & Station: normal Patient leans: N/A  Psychiatric Specialty Exam: Review of Systems  Constitutional: Positive for malaise/fatigue. Negative for weight loss.  HENT: Negative.   Respiratory: Negative.   Gastrointestinal:       Nervousness in her stomach  Genitourinary: Negative.   Musculoskeletal: Positive for back pain.  Skin: Negative for itching and rash.  Neurological: Negative.   Psychiatric/Behavioral: Positive for depression.    Blood pressure 128/82, pulse 87, height 5' 5.5" (1.664 m), weight 221 lb (100.2 kg), SpO2 98 %.Body mass index is 36.22 kg/m.  General Appearance: Casual  Eye Contact:  Good  Speech:  Slow  Volume:  Decreased  Mood:  Anxious and Depressed  Affect:  Constricted  Thought Process:  Goal Directed  Orientation:  Full (Time, Place, and Person)  Thought Content: Rumination   Suicidal Thoughts:  No  Homicidal Thoughts:  No  Memory:  Immediate;   Good Recent;   Good Remote;   Good  Judgement:  Good  Insight:  Good  Psychomotor Activity:  Decreased  Concentration:  Concentration: Fair and Attention Span: Good  Recall:  Good  Fund of Knowledge: Good  Language: Good  Akathisia:  No  Handed:  Right  AIMS (if indicated):  0  Assets:  Communication Skills Desire for Improvement Housing Resilience Talents/Skills  ADL's:  Intact  Cognition: WNL  Sleep:  Okay     Assessment: Bipolar disorder, depressed type  Plan: I review her symptoms, psychosocial stressors, current medication, recent blood work results and collateral information from other providers.  She's been experiencing increased sadness, depression and hopelessness.  She does not want to take any medication that cause further weight gain.  She was recommended to take gabapentin 300 mg 3 times a day but she is not sure to take 3 times due to weight gain.  In the past she had tried Abilify, Geodon and does not want to go back on these medication.  She preferred to continue Zyprexa which is helping her mood swings.  I recommended to try Wellbutrin  XL 450 mg to help energy level.  Continue Zyprexa 10 mg at bedtime, Ativan 1 mg twice a day and trazodone 100 mg at bedtime.  Discussed medication side effects and benefits.  I review blood work results.  She has normal CBC, hemoglobin A1c and basic chemistry.  Patient is not interested in counseling at this time.  Recommended to call us back if she has any question, concern or if she feels worsening of the symptom.  Follow-up in 6 weeks.  Time spent 25 minutes.    Guliana Weyandt T., MD 01/31/2017, 4:00 PM

## 2017-02-01 ENCOUNTER — Other Ambulatory Visit (HOSPITAL_COMMUNITY): Payer: Self-pay

## 2017-02-01 DIAGNOSIS — F319 Bipolar disorder, unspecified: Secondary | ICD-10-CM

## 2017-02-01 MED ORDER — TRAZODONE HCL 100 MG PO TABS: 100.0000 mg | ORAL_TABLET | Freq: Every day | ORAL | 0 refills | Status: DC

## 2017-02-01 NOTE — Progress Notes (Signed)
Message received from patients pharmacy, her insurance will only pay for 90 day supplies of medication. They needed a 90 day of her Trazodone. I sent in a new order for the Trazodone for 90 days.

## 2017-02-08 ENCOUNTER — Ambulatory Visit (INDEPENDENT_AMBULATORY_CARE_PROVIDER_SITE_OTHER): Payer: Medicare Other | Admitting: Student

## 2017-02-08 ENCOUNTER — Encounter: Payer: Self-pay | Admitting: Student

## 2017-02-08 DIAGNOSIS — B349 Viral infection, unspecified: Secondary | ICD-10-CM

## 2017-02-08 MED ORDER — ONDANSETRON HCL 4 MG PO TABS: 4.0000 mg | ORAL_TABLET | Freq: Three times a day (TID) | ORAL | 0 refills | Status: DC | PRN

## 2017-02-08 NOTE — Assessment & Plan Note (Signed)
Upper respiratory symptoms with mild nausea and diarrhea. Able to tolerate PO, hemodynamically stable - zofran ordered - alka seltzer cold and flu for upper respiratory symptoms - follow up as needed

## 2017-02-08 NOTE — Progress Notes (Signed)
   Subjective:    Patient ID: Sandra Miller, female    DOB: July 21, 1976, 41 y.o.   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 WWP. Skin: warm and dry, no rashes noted Neuro: alert and oriented, no focal deficits   Assessment & Plan:    Viral illness Upper respiratory symptoms with mild nausea and diarrhea. Able to tolerate PO, hemodynamically stable - zofran ordered - alka seltzer cold and flu for upper respiratory symptoms - follow up as needed    Arron Tetrault A. Lincoln Brigham MD, [facility] Resident PGY-3 Pager [phone]

## 2017-02-08 NOTE — Patient Instructions (Signed)
Follow up as needed for viral illness Stay well hydrated Take Zofran for nausea Take Alka Seltzer cold and flu for cough anf cold symptoms Call the office with questions or concerns

## 2017-02-18 ENCOUNTER — Other Ambulatory Visit: Payer: Self-pay | Admitting: Family Medicine

## 2017-02-23 ENCOUNTER — Telehealth (HOSPITAL_COMMUNITY): Payer: Self-pay

## 2017-02-23 NOTE — Telephone Encounter (Signed)
Patient is calling because her insurance will no longer cover Wellbutrin 450 mg. She would like to know if she can reduce the dose, or can you prescribe something different. Please review and advise, thank you

## 2017-02-23 NOTE — Telephone Encounter (Signed)
We can reduce Wellbutrin 300 mg

## 2017-02-24 MED ORDER — BUPROPION HCL ER (XL) 300 MG PO TB24: 300.0000 mg | ORAL_TABLET | ORAL | 2 refills | Status: DC

## 2017-02-24 NOTE — Telephone Encounter (Signed)
I sent the prescription in for 300 mg and called the patient to let her know

## 2017-03-14 ENCOUNTER — Ambulatory Visit (INDEPENDENT_AMBULATORY_CARE_PROVIDER_SITE_OTHER): Payer: Managed Care, Other (non HMO) | Admitting: Psychiatry

## 2017-03-14 ENCOUNTER — Encounter (HOSPITAL_COMMUNITY): Payer: Self-pay | Admitting: Psychiatry

## 2017-03-14 VITALS — BP 132/78 | HR 102 | Ht 65.5 in | Wt 221.2 lb

## 2017-03-14 DIAGNOSIS — F3162 Bipolar disorder, current episode mixed, moderate: Secondary | ICD-10-CM

## 2017-03-14 DIAGNOSIS — Z811 Family history of alcohol abuse and dependence: Secondary | ICD-10-CM

## 2017-03-14 DIAGNOSIS — F17210 Nicotine dependence, cigarettes, uncomplicated: Secondary | ICD-10-CM | POA: Diagnosis not present

## 2017-03-14 DIAGNOSIS — Z813 Family history of other psychoactive substance abuse and dependence: Secondary | ICD-10-CM

## 2017-03-14 DIAGNOSIS — Z818 Family history of other mental and behavioral disorders: Secondary | ICD-10-CM | POA: Diagnosis not present

## 2017-03-14 MED ORDER — BUPROPION HCL ER (XL) 300 MG PO TB24: 300.0000 mg | ORAL_TABLET | ORAL | 1 refills | Status: DC

## 2017-03-14 MED ORDER — BUPROPION HCL ER (XL) 150 MG PO TB24: 150.0000 mg | ORAL_TABLET | Freq: Every day | ORAL | 1 refills | Status: DC

## 2017-03-14 NOTE — Progress Notes (Signed)
BH MD/PA/NP OP Progress Note  03/14/2017 4:17 PM Sandra Miller  MRN:  [id number]  Chief Complaint:  Subjective: My insurance does not approve 450 Wellbutrin.  HPI: Sandra Miller came for her follow-up appointment.  She could not fill prescription Wellbutrin 450 because of insurance but she like to try 450.  Her depression remains the same.  She is tired and she worried about weight gain.  She is working 2 jobs.  She is working up to 36 hours.  Sometime she gets very tired and exhausted.  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  She gets very emotional when her clients get very sick.  She continues to gain weight and she is not happy about it.  She feels some time lack of energy, poor attention, concentration, and no motivation.  She has no tremors shakes or any EPS.  Since taking the Topamax for migraines are much better.  She denies any impulsive behavior or any agitation.  She denies any hallucination or any paranoia.  She lives by herself.  Patient denies drinking alcohol or using any illegal substances.  She is taking gabapentin only at bedtime because she is concerned about the weight gain.  Visit Diagnosis:    ICD-10-CM   1. Bipolar 1 disorder, mixed, moderate (HCC) F31.62 buPROPion (WELLBUTRIN XL) 150 MG 24 hr tablet    Past Psychiatric History: Reviewed. Patient has history of bipolar disorder, alcohol dependence and marijuana abuse. She has at least 6 psychiatric hospitalization. In the past she had tried lithium, Seroquel, Lamictal, Paxil, Celexa, Abilify , Geodon Depakote, Neurontin and Cymbalta. Patient denies any history of suicidal attempt but endorsed history of aggressive behavior and poor impulse control.  Past Medical History:  Past Medical History:  Diagnosis Date  . Bipolar 1 disorder ([place])   . GAD (generalized anxiety disorder)   . Genital HSV    currently per pt  no break out 03-22-2016   . GERD (gastroesophageal reflux  disease)   . Hiatal hernia   . History of cervical dysplasia    2012 laser ablation  . History of esophageal dilatation    for dysphasia -- x2 dilated  . History of gastric ulcer   . History of Helicobacter pylori infection    remote hx  . History of hidradenitis suppurativa    "gets all over body intermittantly"    . History of hypertension    no issue since stopped drinking alcohol 2014  . History of kidney stones   . History of panic attacks   . Iron deficiency anemia   . Left ureteral stone   . OCD (obsessive compulsive disorder)   . PTSD (post-traumatic stress disorder)   . Recovering alcoholic in remission ([place])    since 2014  . RLS (restless legs syndrome)   . Smokers' cough ([place])   . Urgency of urination   . Yeast infection involving the vagina and surrounding area    secondary to taking antibiotic    Past Surgical History:  Procedure Laterality Date  . CESAREAN SECTION  1995   w/  Bilateral Tubal Ligation  . COLONOSCOPY  last one 08-09-2013  . CYSTOSCOPY W/ URETERAL STENT PLACEMENT Left 03/29/2016   Procedure: CYSTOSCOPY WITH STENT REPLACEMENT;  Surgeon: Nickie Retort, MD;  Location: [facility];  Service: Urology;  Laterality: Left;  . CYSTOSCOPY WITH RETROGRADE PYELOGRAM, URETEROSCOPY AND STENT PLACEMENT Left 03/08/2016   Procedure: CYSTOSCOPY WITH  LEFT RETROGRADE  PYELOGRAM, AND STENT PLACEMENT;  Surgeon: Nickie Retort, MD;  Location: [facility];  Service: Urology;  Laterality: Left;  . CYSTOSCOPY/RETROGRADE/URETEROSCOPY/STONE EXTRACTION WITH BASKET Left 03/29/2016   Procedure: CYSTOSCOPY/RETROGRADE/URETEROSCOPY/STONE EXTRACTION WITH BASKET;  Surgeon: Nickie Retort, MD;  Location: [facility];  Service: Urology;  Laterality: Left;  . ENDOMETRIAL ABLATION W/ NOVASURE  04-01-2010  . ESOPHAGOGASTRODUODENOSCOPY  last one 08-09-2013  . KNEE ARTHROSCOPY Left as teen  . LASER ABLATION OF THE CERVIX  2012 approx  . TRANSTHORACIC  ECHOCARDIOGRAM  05-19-2006   lvsf normal, ef 55-65%, there was mild flattening of the interventricular septum during diastoli/  RV size at upper limits normal  . WISDOM TOOTH EXTRACTION  age 66 's    Family Psychiatric History: Reviewed.  Family History:  Family History  Problem Relation Age of Onset  . Heart disease Father   . Lung cancer Father   . Alcohol abuse Father   . Heart disease Mother   . Depression Mother   . Anxiety disorder Mother   . Drug abuse Brother   . Alcohol abuse Brother   . Drug abuse Brother   . ADD / ADHD Brother   . Colon cancer Brother        malignant polyp < 60 in 1  . Stomach cancer Paternal Grandfather   . Diabetes Maternal Grandfather   . Diabetes Paternal Grandmother   . Kidney disease Maternal Uncle   . Cirrhosis Cousin        alcoholic  . Anxiety disorder Maternal Aunt   . Depression Maternal Aunt     Social History:  Social History   Social History  . Marital status: Single    Spouse name: N/A  . Number of children: N/A  . Years of education: N/A   Social History Main Topics  . Smoking status: Current Every Day Smoker    Packs/day: 1.00    Years: 22.00    Types: Cigarettes  . Smokeless tobacco: Never Used     Comment: Recently started a smoking cessation class.   . Alcohol use No     Comment:  hx alcohollism  in remission since 2014  . Drug use: No     Comment: hx  marijuana use  . Sexual activity: Yes    Partners: Male    Birth control/ protection: Surgical   Other Topics Concern  . Not on file   Social History Narrative   Former [facility] patient.      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    In school at [employer] to become Psychologist, sport and exercise.     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      Last updated   01/23/2017       Allergies:  Allergies  Allergen Reactions  . Depakote [Divalproex Sodium] Nausea And Vomiting   . Minocycline Hives  . Aspirin Hives    Metabolic Disorder Labs: Lab Results  Component Value Date   HGBA1C 5.2 11/10/2016   MPG 103 11/10/2016   No results found for: PROLACTIN Lab Results  Component Value Date   CHOL 197 06/13/2008   TRIG 160 (H) 06/13/2008   HDL 42 06/13/2008   CHOLHDL 4.7 Ratio 06/13/2008   VLDL 32 06/13/2008   LDLCALC 123 (H) 06/13/2008     Current Medications: Current Outpatient Prescriptions  Medication Sig Dispense Refill  . acetaminophen (TYLENOL) 500 MG tablet Take 1,000 mg by mouth every 6 (six) hours as needed for moderate pain or fever.    Marland Kitchen buPROPion (WELLBUTRIN XL) 300 MG 24 hr tablet Take 1 tablet (300 mg total) by mouth every morning. 30 tablet 2  . docusate sodium (COLACE) 100 MG capsule Take 1 capsule (100 mg total) by mouth 2 (two) times daily. (Patient taking differently: Take 100 mg by mouth every morning. ) 10 capsule 0  . gabapentin (NEURONTIN) 300 MG capsule Take 1 capsule (300 mg total) by mouth 3 (three) times daily. Bid for first week then tid 90 capsule 5  . LORazepam (ATIVAN) 1 MG tablet Take 1 tablet (1 mg total) by mouth 2 (two) times daily. 60 tablet 2  . Multiple Vitamin (MULTIVITAMIN) tablet Take 1 tablet by mouth daily.    Marland Kitchen OLANZapine (ZYPREXA) 10 MG tablet Take 1 tablet (10 mg total) by mouth at bedtime. 30 tablet 2  . omeprazole (PRILOSEC) 40 MG capsule Take 1 capsule (40 mg total) by mouth 2 (two) times daily. 180 capsule 3  . ondansetron (ZOFRAN) 4 MG tablet Take 1 tablet (4 mg total) by mouth every 8 (eight) hours as needed for nausea or vomiting. 30 tablet 0  . topiramate (TOPAMAX) 50 MG tablet Take 1 tablet (50 mg total) by mouth daily. 90 tablet 0  . traZODone (DESYREL) 100 MG tablet Take 1 tablet (100 mg total) by mouth at bedtime. 90 tablet 0  . valACYclovir (VALTREX) 500 MG tablet TAKE 1 TABLET BY MOUTH DAILY AS NEEDED FOR BREAKOUTS 30 tablet 3  . valACYclovir (VALTREX) 500 MG tablet TAKE 1 TABLET BY MOUTH ONCE  DAILY AS NEEDED FOR BREAKOUTS. 30 tablet 0   No current facility-administered medications for this visit.     Neurologic: Headache: No Seizure: No Paresthesias: No  Musculoskeletal: Strength & Muscle Tone: within normal limits Gait & Station: normal Patient leans: N/A  Psychiatric Specialty Exam: ROS  Blood pressure 132/78, pulse (!) 102, height 5' 5.5" (1.664 m), weight 221 lb 3.2 oz (100.3 kg).Body mass index is 36.25 kg/m.  General Appearance: Casual  Eye Contact:  Good  Speech:  Clear and Coherent  Volume:  Normal  Mood:  Depressed and Dysphoric  Affect:  Constricted  Thought Process:  Goal Directed  Orientation:  Full (Time, Place, and Person)  Thought Content: Rumination   Suicidal Thoughts:  No  Homicidal Thoughts:  No  Memory:  Immediate;   Good Recent;   Good Remote;   Good  Judgement:  Good  Insight:  Good  Psychomotor Activity:  Decreased  Concentration:  Concentration: Fair and Attention Span: Fair  Recall:  Good  Fund of Knowledge: Good  Language: Good  Akathisia:  No  Handed:  Right  AIMS (if indicated):  0  Assets:  Communication Skills Desire for Improvement Housing Social Support  ADL's:  Intact  Cognition: WNL  Sleep:  Okay    Assessment: Bipolar disorder depressed type.  Plan: Her insurance does not approve Wellbutrin XL 450.  We will prescribe 2 prescription for Wellbutrin.  She still like to try a higher dose of Wellbutrin.  Recommended to try Wellbutrin XL 300 mg daily and Wellbutrin XL 150 mg daily.  Reminded medication side effects especially tremors shakes or any EPS.  Continue gabapentin 300 mg at bedtime, Zyprexa 10 mg at bedtime and Ativan 1 mg twice a day and trazodone 100 mg at bedtime.  Recommended to  call us back if she has any question or any concern.  Follow-up in 6 weeks.  Jasamine Pottinger T., MD 03/14/2017, 4:17 PM

## 2017-03-15 ENCOUNTER — Other Ambulatory Visit (HOSPITAL_COMMUNITY): Payer: Self-pay

## 2017-03-15 DIAGNOSIS — F3162 Bipolar disorder, current episode mixed, moderate: Secondary | ICD-10-CM

## 2017-03-15 MED ORDER — BUPROPION HCL ER (XL) 150 MG PO TB24: 150.0000 mg | ORAL_TABLET | Freq: Every day | ORAL | 0 refills | Status: DC

## 2017-03-15 MED ORDER — BUPROPION HCL ER (XL) 300 MG PO TB24: 300.0000 mg | ORAL_TABLET | ORAL | 0 refills | Status: DC

## 2017-03-17 ENCOUNTER — Other Ambulatory Visit: Payer: Self-pay | Admitting: Family Medicine

## 2017-03-29 ENCOUNTER — Encounter: Payer: Self-pay | Admitting: Student

## 2017-03-29 ENCOUNTER — Ambulatory Visit (INDEPENDENT_AMBULATORY_CARE_PROVIDER_SITE_OTHER): Payer: Managed Care, Other (non HMO) | Admitting: Student

## 2017-03-29 VITALS — BP 114/68 | HR 82 | Temp 98.2°F | Ht 67.0 in | Wt 220.8 lb

## 2017-03-29 DIAGNOSIS — M542 Cervicalgia: Secondary | ICD-10-CM

## 2017-03-29 NOTE — Progress Notes (Signed)
Subjective:    Sandra Miller is a 41 y.o. old female here SDA for neck pain  HPI Left neck pain: for 4 days. Radiates to her left chest. Pain is like "slicing". Happens anytime. Last one was when she was sitting in the car. She doesn't have the pain now. She denies history of trauma or injury. She [place] right handed. 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 Has history of cervical stenosis but this pain is different from her usual neck pain. She denies shortness of breath, associated nausea, diaphoresis. She has numbness and tingling in her arms which are not new. Denies fever, night sweat, fatigue, bowel or bladder issue. The chest pain is not exertional. She reports dizziness when she stands up.  She is worried that she could have stroke or heart attack due to her weight gain. She in on Zyprexa for BPD-1. She also reports smoking about a pack a day but not ready to quit.  PMH/Problem List: has Herpes simplex virus (HSV) infection; DISORDER, BIPOLAR NOS; H/O: HTN (hypertension); Tobacco abuse; GERD (gastroesophageal reflux disease); Genital warts; Neck pain on left side; Loss of weight; Hidradenitis suppurativa of left axilla; Healthcare maintenance; Nephrolithiasis; Dysfunctional uterine bleeding; Obstructive pyelonephritis; Hypokalemia; Normocytic anemia; UTI (lower urinary tract infection); Restless leg syndrome; and Viral illness on her problem list.   has a past medical history of Bipolar 1 disorder ([place]); GAD (generalized anxiety disorder); Genital HSV; GERD (gastroesophageal reflux disease); Hiatal hernia; History of cervical dysplasia; History of esophageal dilatation; History of gastric ulcer; History of Helicobacter pylori infection; History of hidradenitis suppurativa; History of hypertension; History of kidney stones; History of panic attacks; Iron deficiency anemia; Left ureteral stone; OCD (obsessive compulsive disorder); PTSD (post-traumatic stress disorder); Recovering alcoholic in remission  ([place]); RLS (restless legs syndrome); Smokers' cough ([place]); Urgency of urination; and Yeast infection involving the vagina and surrounding area.  FH:  Family History  Problem Relation Age of Onset  . Heart disease Father   . Lung cancer Father   . Alcohol abuse Father   . Heart disease Mother   . Depression Mother   . Anxiety disorder Mother   . Drug abuse Brother   . Alcohol abuse Brother   . Drug abuse Brother   . ADD / ADHD Brother   . Colon cancer Brother        malignant polyp < 60 in 1  . Stomach cancer Paternal Grandfather   . Diabetes Maternal Grandfather   . Diabetes Paternal Grandmother   . Kidney disease Maternal Uncle   . Cirrhosis Cousin        alcoholic  . Anxiety disorder Maternal Aunt   . Depression Maternal Aunt     SH Social History  Substance Use Topics  . Smoking status: Current Every Day Smoker    Packs/day: 1.00    Years: 22.00    Types: Cigarettes  . Smokeless tobacco: Never Used     Comment: Recently started a smoking cessation class.   . Alcohol use No     Comment:  hx alcohollism  in remission since 2014    Review of Systems Review of systems negative except for pertinent positives and negatives in history of present illness above.     Objective:     Vitals:   03/29/17 1101  BP: 114/68  Pulse: 82  Temp: 98.2 F (36.8 C)  TempSrc: Oral  SpO2: 98%  Weight: 220 lb 12.8 oz (100.2 kg)  Height: 5\' 7"  (1.702 m)  Physical Exam GEN: appears well, no apparent distress. Head: normocephalic and atraumatic  Eyes: conjunctiva without injection, sclera anicteric Oropharynx: mmm without erythema or exudation HEM: negative for cervical or periauricular lymphadenopathies ENDO: negative thyromegally CVS: RRR, nl S1&S2, no murmurs, no edema. No carotid bruits bilaterally RESP: no IWOB, good air movement bilaterally, CTAB MSK: no tenderness or notable swelling over her neck or chest SKIN: no apparent skin lesion over her neck or chest NEURO:  awake, alert and oriented appropriately. Cranial nerves II-XII intact, motor 5/5 in all muscle groups of UE and LE bilaterally, normal tone, light sensation intact in all dermatomes of upper and lower ext bilaterally, no pronator drift, biceps and patellar reflexes 2+ bilaterally, finger to nose intact, gait is normal PSYCH: euthymic mood with congruent affect    Assessment and Plan:  Neck pain on left side Likely musculoskeletal but not reproducible. She has history of neck pain in the past due to her cervical stenosis but states that this is not same. Her neuro exam is within normal limit. Pain radiates to her chest but very atypical for ACS. She is worried about stroke and heart attack which are very unlikely. However, I have reminded of her risk factors including obesity and smoking. Others on differential are early HSV given history of this, and psychogenic given history of GAD and BPD-1. I recommend trying ice or heart to see if this helps. Discussed return precautions as in AVS.  Return if symptoms worsen or fail to improve.  Mercy Riding, MD 03/29/17 Pager: [phone]

## 2017-03-29 NOTE — Assessment & Plan Note (Addendum)
Likely musculoskeletal but not reproducible. She has history of neck pain in the past due to her cervical stenosis but states that this is not same. Her neuro exam is within normal limit. Pain radiates to her chest but very atypical for ACS. She is worried about stroke and heart attack which are very unlikely. However, I have reminded of her risk factors including obesity and smoking. Others on differential are early HSV given history of this, and psychogenic given history of GAD and BPD-1. I recommend trying ice or heart to see if this helps. Discussed return precautions as in AVS.

## 2017-03-29 NOTE — Patient Instructions (Signed)
NeckIt was great seeing you today! We have addressed the following issues today 1. Neck pain: this very unlikely that this is a stroke or heart attack. However, you have risk factors for stroke and heart attack which includes some weight, smoking and age. So I recommend thinking about these. Please seek immediate care if you have severe chest pain, trouble breathing, excessive sweating or other symptoms concerning to you.   If we did any lab work today, and the results require attention, either me or my nurse will get in touch with you. If everything is normal, you will get a letter in mail and a message via . If you don't hear from [place] in two weeks, please give [place] a call. Otherwise, we look forward to seeing you again at your next visit. If you have any questions or concerns before then, please call the clinic at [phone].  Please bring all your medications to every doctors visit  Sign up for My Chart to have easy access to your labs results, and communication with your Primary care physician.    Please check-out at the front desk before leaving the clinic.    Take Care,   Dr. Cyndia Skeeters

## 2017-04-02 ENCOUNTER — Encounter (HOSPITAL_COMMUNITY): Payer: Self-pay | Admitting: Emergency Medicine

## 2017-04-02 ENCOUNTER — Ambulatory Visit (HOSPITAL_COMMUNITY)
Admission: EM | Admit: 2017-04-02 | Discharge: 2017-04-02 | Disposition: A | Discharge status: Home / Self Care | Payer: Medicare Other | Attending: Internal Medicine | Admitting: Internal Medicine

## 2017-04-02 DIAGNOSIS — M549 Dorsalgia, unspecified: Secondary | ICD-10-CM | POA: Diagnosis not present

## 2017-04-02 DIAGNOSIS — M791 Myalgia: Secondary | ICD-10-CM | POA: Diagnosis not present

## 2017-04-02 DIAGNOSIS — M7918 Myalgia, other site: Secondary | ICD-10-CM

## 2017-04-02 DIAGNOSIS — M542 Cervicalgia: Secondary | ICD-10-CM | POA: Diagnosis not present

## 2017-04-02 LAB — POCT URINALYSIS DIP (DEVICE)
Bilirubin Urine: NEGATIVE
Glucose, UA: NEGATIVE mg/dL
HGB URINE DIPSTICK: NEGATIVE
KETONES UR: NEGATIVE mg/dL
Leukocytes, UA: NEGATIVE
Nitrite: NEGATIVE
PH: 6 (ref 5.0–8.0)
PROTEIN: NEGATIVE mg/dL
SPECIFIC GRAVITY, URINE: 1.015 (ref 1.005–1.030)
Urobilinogen, UA: 0.2 mg/dL (ref 0.0–1.0)

## 2017-04-02 MED ORDER — KETOROLAC TROMETHAMINE 60 MG/2ML IM SOLN
60.0000 mg | Freq: Once | INTRAMUSCULAR | Status: AC
  Administered 2017-04-02: 60 mg via INTRAMUSCULAR

## 2017-04-02 MED ORDER — KETOROLAC TROMETHAMINE 60 MG/2ML IM SOLN
INTRAMUSCULAR | Status: AC
  Filled 2017-04-02: qty 2

## 2017-04-02 MED ORDER — GI COCKTAIL ~~LOC~~
ORAL | Status: AC
  Filled 2017-04-02: qty 30

## 2017-04-02 MED ORDER — PREDNISONE 50 MG PO TABS: 50.0000 mg | ORAL_TABLET | Freq: Every day | ORAL | 0 refills | Status: AC

## 2017-04-02 MED ORDER — GI COCKTAIL ~~LOC~~
30.0000 mL | Freq: Once | ORAL | Status: AC
  Administered 2017-04-02: 30 mL via ORAL

## 2017-04-02 NOTE — ED Triage Notes (Signed)
Neck pain since last Saturday.  Was seen at family practice and no diagnosis, no medicines, no diagnosis.  Told she was not having a heart attack or stroke.    The back has been for 3-4 days.

## 2017-04-02 NOTE — Discharge Instructions (Addendum)
Anticipate gradual improvement over the next several days to weeks. Urine test today was negative for blood and UTI. Pain in the left neck/shoulder and the left low back seems most likely to be muscular, but can't rule out a contribution from underlying spinal stenosis in the neck. Prescription for some prednisone was sent to the pharmacy. An injection of ketorolac was given at the urgent care, with a GI cocktail to help with stomach acid discomfort. Use ice for 5-10 minutes several times daily to help with discomfort. Continue with Tylenol, and try taking your Flexeril at home, at bedtime, so the sleep quality might be a little better. Physical therapy for gentle stretching, modalities like TENS unit, and core strengthening, may also help with discomfort. Recheck or follow up with your primary care provider, or a sports medicine doctor, if not improving as expected.

## 2017-04-02 NOTE — ED Provider Notes (Signed)
[place]    CSN: [id number] Arrival date & time: 04/02/17  1201     History   Chief Complaint Chief Complaint  Patient presents with  . Back Pain  . Neck Pain    HPI Sandra Miller is a 41 y.o. female. She has a history of spinal stenosis in the neck. She presents today with onset of pain in the left neck, radiates into the shoulder and trapezius area. 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 No other unusual activities, no trauma. Pain is constant, and hard to describe, maybe like a toothache. She has been taking Tylenol regularly, without sufficient relief. She took a BC powder, but it upset her stomach, and she is already taking omeprazole 40 mg twice a day. No weakness or clumsiness in the arms, no change in chronic tingling in the left hand. No change in bowel or bladder function. The last 4 days, she has also had pain in the left lateral buttock and low back. This is positional, most noticeable when she stands from a seated position, and other movements. No weakness or clumsiness in her legs. She wondered about a kidney stone. She was seen in the family medicine clinic a few days ago, and evaluated for MI and stroke, with negative result. She has some Flexeril at home, that she considered taking, but was concerned to take it without consulting someone.  HPI  Past Medical History:  Diagnosis Date  . Bipolar 1 disorder ([place])   . GAD (generalized anxiety disorder)   . Genital HSV    currently per pt  no break out 03-22-2016   . GERD (gastroesophageal reflux disease)   . Hiatal hernia   . History of cervical dysplasia    2012 laser ablation  . History of esophageal dilatation    for dysphasia -- x2 dilated  . History of gastric ulcer   . History of Helicobacter pylori infection    remote hx  . History of hidradenitis suppurativa    "gets all over body intermittantly"     . History of hypertension    no issue since stopped drinking alcohol 2014  . History of kidney stones   . History of panic attacks   . Iron deficiency anemia   . Left ureteral stone   . OCD (obsessive compulsive disorder)   . PTSD (post-traumatic stress disorder)   . Recovering alcoholic in remission ([place])    since 2014  . RLS (restless legs syndrome)   . Smokers' cough ([place])   . Urgency of urination   . Yeast infection involving the vagina and surrounding area    secondary to taking antibiotic    Patient Active Problem List   Diagnosis Date Noted  . Viral illness 02/08/2017  . Restless leg syndrome 04/01/2016  . Nephrolithiasis 03/08/2016  . Dysfunctional uterine bleeding 03/08/2016  . Obstructive pyelonephritis 03/08/2016  . Hypokalemia 03/08/2016  . Normocytic anemia 03/08/2016  . UTI (lower urinary tract infection)   . Healthcare maintenance 12/24/2015  . Hidradenitis suppurativa of left axilla 11/03/2015  . Loss of weight 09/08/2015  . Neck pain on left side 10/22/2014  . Genital warts 05/20/2014  . GERD (gastroesophageal reflux disease) 12/26/2012  . Tobacco abuse 08/22/2012  . H/O: HTN (hypertension) 03/19/2009  . Herpes simplex virus (HSV) infection 02/03/2007  . DISORDER, BIPOLAR NOS 02/03/2007  Past Surgical History:  Procedure Laterality Date  . CESAREAN SECTION  1995   w/  Bilateral Tubal Ligation  . COLONOSCOPY  last one 08-09-2013  . CYSTOSCOPY W/ URETERAL STENT PLACEMENT Left 03/29/2016   Procedure: CYSTOSCOPY WITH STENT REPLACEMENT;  Surgeon: Nickie Retort, MD;  Location: [facility];  Service: Urology;  Laterality: Left;  . CYSTOSCOPY WITH RETROGRADE PYELOGRAM, URETEROSCOPY AND STENT PLACEMENT Left 03/08/2016   Procedure: CYSTOSCOPY WITH  LEFT RETROGRADE PYELOGRAM, AND STENT PLACEMENT;  Surgeon: Nickie Retort, MD;  Location: [facility];  Service: Urology;  Laterality: Left;  . CYSTOSCOPY/RETROGRADE/URETEROSCOPY/STONE EXTRACTION  WITH BASKET Left 03/29/2016   Procedure: CYSTOSCOPY/RETROGRADE/URETEROSCOPY/STONE EXTRACTION WITH BASKET;  Surgeon: Nickie Retort, MD;  Location: [facility];  Service: Urology;  Laterality: Left;  . ENDOMETRIAL ABLATION W/ NOVASURE  04-01-2010  . ESOPHAGOGASTRODUODENOSCOPY  last one 08-09-2013  . KNEE ARTHROSCOPY Left as teen  . LASER ABLATION OF THE CERVIX  2012 approx  . TRANSTHORACIC ECHOCARDIOGRAM  05-19-2006   lvsf normal, ef 55-65%, there was mild flattening of the interventricular septum during diastoli/  RV size at upper limits normal  . WISDOM TOOTH EXTRACTION  age 42 's    Home Medications    Prior to Admission medications   Medication Sig Start Date End Date Taking? Authorizing Provider  acetaminophen (TYLENOL) 500 MG tablet Take 1,000 mg by mouth every 6 (six) hours as needed for moderate pain or fever.    [provider]  buPROPion (WELLBUTRIN XL) 150 MG 24 hr tablet Take 1 tablet (150 mg total) by mouth daily. 03/15/17   Arfeen, Arlyce Harman, MD  buPROPion (WELLBUTRIN XL) 300 MG 24 hr tablet Take 1 tablet (300 mg total) by mouth every morning. 03/15/17 03/15/18  Arfeen, Arlyce Harman, MD  docusate sodium (COLACE) 100 MG capsule Take 1 capsule (100 mg total) by mouth 2 (two) times daily. Patient taking differently: Take 100 mg by mouth every morning.  03/10/16   Donne Hazel, MD  gabapentin (NEURONTIN) 300 MG capsule Take 1 capsule (300 mg total) by mouth 3 (three) times daily. Bid for first week then tid 01/23/17   Gatha Mayer, MD  LORazepam (ATIVAN) 1 MG tablet Take 1 tablet (1 mg total) by mouth 2 (two) times daily. 01/31/17   Arfeen, Arlyce Harman, MD  Multiple Vitamin (MULTIVITAMIN) tablet Take 1 tablet by mouth daily.    [provider]  OLANZapine (ZYPREXA) 10 MG tablet Take 1 tablet (10 mg total) by mouth at bedtime. 01/31/17 01/31/18  Arfeen, Arlyce Harman, MD  omeprazole (PRILOSEC) 40 MG capsule Take 1 capsule (40 mg total) by mouth 2 (two) times daily. 01/23/17    Gatha Mayer, MD  ondansetron (ZOFRAN) 4 MG tablet Take 1 tablet (4 mg total) by mouth every 8 (eight) hours as needed for nausea or vomiting. 02/08/17   Haney, Yetta Flock A, MD  predniSONE (DELTASONE) 50 MG tablet Take 1 tablet (50 mg total) by mouth daily. 04/02/17 04/07/17  Sherlene Shams, MD  topiramate (TOPAMAX) 50 MG tablet Take 1 tablet (50 mg total) by mouth daily. 01/31/17 01/31/18  Arfeen, Arlyce Harman, MD  traZODone (DESYREL) 100 MG tablet Take 1 tablet (100 mg total) by mouth at bedtime. 02/01/17   Arfeen, Arlyce Harman, MD  valACYclovir (VALTREX) 500 MG tablet TAKE 1 TABLET BY MOUTH ONCE DAILY AS NEEDED FOR BREAKOUTS. 02/20/17   Virginia Crews, MD  valACYclovir (VALTREX) 500 MG tablet TAKE 1 TABLET BY MOUTH DAILY  AS NEEDED FOR BREAKOUTS 03/20/17   Verner Mould, MD    Family History Family History  Problem Relation Age of Onset  . Heart disease Father   . Lung cancer Father   . Alcohol abuse Father   . Heart disease Mother   . Depression Mother   . Anxiety disorder Mother   . Drug abuse Brother   . Alcohol abuse Brother   . Drug abuse Brother   . ADD / ADHD Brother   . Colon cancer Brother        malignant polyp < 60 in 1  . Stomach cancer Paternal Grandfather   . Diabetes Maternal Grandfather   . Diabetes Paternal Grandmother   . Kidney disease Maternal Uncle   . Cirrhosis Cousin        alcoholic  . Anxiety disorder Maternal Aunt   . Depression Maternal Aunt     Social History Social History  Substance Use Topics  . Smoking status: Current Every Day Smoker    Packs/day: 1.00    Years: 22.00    Types: Cigarettes  . Smokeless tobacco: Never Used     Comment: Recently started a smoking cessation class.   . Alcohol use No     Comment:  hx alcohollism  in remission since 2014     Allergies   Depakote [divalproex sodium]; Minocycline; and Aspirin   Review of Systems Review of Systems  All other systems reviewed and are negative.    Physical Exam Triage  Vital Signs ED Triage Vitals  Enc Vitals Group     BP 04/02/17 1212 117/79     Pulse Rate 04/02/17 1212 93     Resp 04/02/17 1212 16     Temp 04/02/17 1212 98.9 F (37.2 C)     Temp Source 04/02/17 1212 Oral     SpO2 04/02/17 1212 100 %     Weight --      Height --      Pain Score 04/02/17 1216 8     Pain Loc --    Updated Vital Signs BP 117/79 (BP Location: Right Arm)   Pulse 93   Temp 98.9 F (37.2 C) (Oral)   Resp 16   SpO2 100%   Physical Exam  Constitutional: She is oriented to person, place, and time. No distress.  HENT:  Head: Atraumatic.  Eyes:  Conjugate gaze observed, no eye redness/discharge  Neck: Neck supple.  Excellent range of motion in the neck, but left neck rotation is painful, and neck flexion and extension are painful. Mild tenderness to palpation diffusely over the left sternocleidomastoid and trapezius muscles. No rash, no redness/bruising/focal swelling.  Cardiovascular: Normal rate.   Pulmonary/Chest: No respiratory distress.  Abdominal: She exhibits no distension.  Musculoskeletal: Normal range of motion.  Able to raise both arms at the shoulder without difficulty, internally and externally rotate. Strength in the bilateral upper extremities is 5/5 and symmetric, proximal and distal. Patient was able to walk into the urgent care independently, and climb on and off the exam table.  Neurological: She is alert and oriented to person, place, and time.  Skin: Skin is warm and dry.  Nursing note and vitals reviewed.    UC Treatments / Results  Labs Results for orders placed or performed during the hospital encounter of 04/02/17  POCT urinalysis dip (device)  Result Value Ref Range   Glucose, UA NEGATIVE NEGATIVE mg/dL   Bilirubin Urine NEGATIVE NEGATIVE   Ketones, ur NEGATIVE NEGATIVE mg/dL  Specific Gravity, Urine 1.015 1.005 - 1.030   Hgb urine dipstick NEGATIVE NEGATIVE   pH 6.0 5.0 - 8.0   Protein, ur NEGATIVE NEGATIVE mg/dL    Urobilinogen, UA 0.2 0.0 - 1.0 mg/dL   Nitrite NEGATIVE NEGATIVE   Leukocytes, UA NEGATIVE NEGATIVE    Procedures Procedures (including critical care time)  Medications Ordered in UC Medications  ketorolac (TORADOL) injection 60 mg (60 mg Intramuscular Given 04/02/17 1248)  gi cocktail (Maalox,Lidocaine,Donnatal) (30 mLs Oral Given 04/02/17 1248)     Final Clinical Impressions(s) / UC Diagnoses   Final diagnoses:  Myofascial pain on left side   Anticipate gradual improvement over the next several days to weeks. Urine test today was negative for blood and UTI. Pain in the left neck/shoulder and the left low back seems most likely to be muscular, but can't rule out a contribution from underlying spinal stenosis in the neck. Prescription for some prednisone was sent to the pharmacy. An injection of ketorolac was given at the urgent care, with a GI cocktail to help with stomach acid discomfort. Use ice for 5-10 minutes several times daily to help with discomfort. Continue with Tylenol, and try taking your Flexeril at home, at bedtime, so the sleep quality might be a little better. Physical therapy for gentle stretching, modalities like TENS unit, and core strengthening, may also help with discomfort. Recheck or follow up with your primary care provider, or a sports medicine doctor, if not improving as expected.  New Prescriptions Discharge Medication List as of 04/02/2017  1:15 PM    START taking these medications   Details  predniSONE (DELTASONE) 50 MG tablet Take 1 tablet (50 mg total) by mouth daily., Starting Sun 04/02/2017, Until Fri 04/07/2017, Normal         Sherlene Shams, MD 04/03/17 [id number]

## 2017-04-25 ENCOUNTER — Ambulatory Visit (HOSPITAL_COMMUNITY): Payer: Self-pay | Admitting: Psychiatry

## 2017-04-27 ENCOUNTER — Ambulatory Visit (INDEPENDENT_AMBULATORY_CARE_PROVIDER_SITE_OTHER): Payer: Managed Care, Other (non HMO) | Admitting: Psychiatry

## 2017-04-27 ENCOUNTER — Encounter (HOSPITAL_COMMUNITY): Payer: Self-pay | Admitting: Psychiatry

## 2017-04-27 ENCOUNTER — Other Ambulatory Visit: Payer: Self-pay | Admitting: Internal Medicine

## 2017-04-27 DIAGNOSIS — F3162 Bipolar disorder, current episode mixed, moderate: Secondary | ICD-10-CM

## 2017-04-27 DIAGNOSIS — Z818 Family history of other mental and behavioral disorders: Secondary | ICD-10-CM | POA: Diagnosis not present

## 2017-04-27 DIAGNOSIS — M542 Cervicalgia: Secondary | ICD-10-CM | POA: Diagnosis not present

## 2017-04-27 DIAGNOSIS — F17210 Nicotine dependence, cigarettes, uncomplicated: Secondary | ICD-10-CM

## 2017-04-27 DIAGNOSIS — M545 Low back pain: Secondary | ICD-10-CM | POA: Diagnosis not present

## 2017-04-27 DIAGNOSIS — Z811 Family history of alcohol abuse and dependence: Secondary | ICD-10-CM

## 2017-04-27 DIAGNOSIS — Z813 Family history of other psychoactive substance abuse and dependence: Secondary | ICD-10-CM

## 2017-04-27 DIAGNOSIS — F319 Bipolar disorder, unspecified: Secondary | ICD-10-CM

## 2017-04-27 MED ORDER — TOPIRAMATE 50 MG PO TABS: 50.0000 mg | ORAL_TABLET | Freq: Every day | ORAL | 0 refills | Status: DC

## 2017-04-27 MED ORDER — OLANZAPINE 10 MG PO TABS: 10.0000 mg | ORAL_TABLET | Freq: Every day | ORAL | 2 refills | Status: DC

## 2017-04-27 MED ORDER — TRAZODONE HCL 100 MG PO TABS: 100.0000 mg | ORAL_TABLET | Freq: Every day | ORAL | 0 refills | Status: DC

## 2017-04-27 MED ORDER — LORAZEPAM 1 MG PO TABS: 1.0000 mg | ORAL_TABLET | Freq: Two times a day (BID) | ORAL | 2 refills | Status: DC

## 2017-04-27 MED ORDER — BUPROPION HCL ER (XL) 300 MG PO TB24
300.0000 mg | ORAL_TABLET | ORAL | 0 refills | Status: DC
Start: 2017-04-27 — End: 2017-10-23

## 2017-04-27 MED ORDER — BUPROPION HCL ER (XL) 150 MG PO TB24: 150.0000 mg | ORAL_TABLET | Freq: Every day | ORAL | 0 refills | Status: DC

## 2017-04-27 NOTE — Progress Notes (Signed)
[facility] MD/PA/NP OP Progress Note  04/27/2017 2:48 PM [name]  MRN:  [id number]  Chief Complaint:  I am feeling better on medication.  I still unable to lose weight.  HPI: Sandra Miller came for her follow-up appointment.  She is not taking Wellbutrin 450 mg daily.  She is feeling better.  She is less anxious and less depressed.  Her energy level is improved from the past.  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  She is hoping to find a new job and she has an interview next week.  Recently she's seen primary care physician for neck pain .  She was given injection and she is feeling better.  She sleeping good.  She denies any irritability, anger, mania or any psychosis.  Her attention and concentration is improved from the past.  She denies any crying spells.  However she continue struggle losing weight.  She also admitted not able to keep a close eye on calorie intake.  She still drink soda in the night.  But she is happy that her weight has been not increased from the past.  Patient denies drinking alcohol or using any illegal substances.  She has no tremors, shakes or any EPS.    Visit Diagnosis:    ICD-10-CM   1. Bipolar 1 disorder (HCC) F31.9 topiramate (TOPAMAX) 50 MG tablet    traZODone (DESYREL) 100 MG tablet    OLANZapine (ZYPREXA) 10 MG tablet    LORazepam (ATIVAN) 1 MG tablet  2. Bipolar 1 disorder, mixed, moderate (HCC) F31.62 buPROPion (WELLBUTRIN XL) 150 MG 24 hr tablet    Past Psychiatric History: Reviewed. Patient has history of bipolar disorder, alcohol dependence and marijuana abuse. She has at least 6 psychiatric hospitalization. In the past she had tried lithium, Seroquel, Lamictal, Paxil, Celexa, Abilify , Geodon Depakote, Neurontin and Cymbalta. Patient denies any history of suicidal attempt but endorsed history of aggressive behavior and poor impulse control.  Past Medical History:  Past Medical History:   Diagnosis Date  . Bipolar 1 disorder ([place])   . GAD (generalized anxiety disorder)   . Genital HSV    currently per pt  no break out 03-22-2016   . GERD (gastroesophageal reflux disease)   . Hiatal hernia   . History of cervical dysplasia    2012 laser ablation  . History of esophageal dilatation    for dysphasia -- x2 dilated  . History of gastric ulcer   . History of Helicobacter pylori infection    remote hx  . History of hidradenitis suppurativa    "gets all over body intermittantly"    . History of hypertension    no issue since stopped drinking alcohol 2014  . History of kidney stones   . History of panic attacks   . Iron deficiency anemia   . Left ureteral stone   . OCD (obsessive compulsive disorder)   . PTSD (post-traumatic stress disorder)   . Recovering alcoholic in remission ([place])    since 2014  . RLS (restless legs syndrome)   . Smokers' cough ([place])   . Urgency of urination   . Yeast infection involving the vagina and surrounding area    secondary to taking antibiotic    Past Surgical History:  Procedure Laterality Date  . CESAREAN SECTION  1995   w/  Bilateral Tubal Ligation  . COLONOSCOPY  last one 08-09-2013  . CYSTOSCOPY W/  URETERAL STENT PLACEMENT Left 03/29/2016   Procedure: CYSTOSCOPY WITH STENT REPLACEMENT;  Surgeon: Nickie Retort, MD;  Location: [facility];  Service: Urology;  Laterality: Left;  . CYSTOSCOPY WITH RETROGRADE PYELOGRAM, URETEROSCOPY AND STENT PLACEMENT Left 03/08/2016   Procedure: CYSTOSCOPY WITH  LEFT RETROGRADE PYELOGRAM, AND STENT PLACEMENT;  Surgeon: Nickie Retort, MD;  Location: [facility];  Service: Urology;  Laterality: Left;  . CYSTOSCOPY/RETROGRADE/URETEROSCOPY/STONE EXTRACTION WITH BASKET Left 03/29/2016   Procedure: CYSTOSCOPY/RETROGRADE/URETEROSCOPY/STONE EXTRACTION WITH BASKET;  Surgeon: Nickie Retort, MD;  Location: [facility];  Service: Urology;  Laterality: Left;  . ENDOMETRIAL  ABLATION W/ NOVASURE  04-01-2010  . ESOPHAGOGASTRODUODENOSCOPY  last one 08-09-2013  . KNEE ARTHROSCOPY Left as teen  . LASER ABLATION OF THE CERVIX  2012 approx  . TRANSTHORACIC ECHOCARDIOGRAM  05-19-2006   lvsf normal, ef 55-65%, there was mild flattening of the interventricular septum during diastoli/  RV size at upper limits normal  . WISDOM TOOTH EXTRACTION  age 6 's    Family Psychiatric History: Reviewed.  Family History:  Family History  Problem Relation Age of Onset  . Heart disease Father   . Lung cancer Father   . Alcohol abuse Father   . Heart disease Mother   . Depression Mother   . Anxiety disorder Mother   . Drug abuse Brother   . Alcohol abuse Brother   . Drug abuse Brother   . ADD / ADHD Brother   . Colon cancer Brother        malignant polyp < 60 in 1  . Stomach cancer Paternal Grandfather   . Diabetes Maternal Grandfather   . Diabetes Paternal Grandmother   . Kidney disease Maternal Uncle   . Cirrhosis Cousin        alcoholic  . Anxiety disorder Maternal Aunt   . Depression Maternal Aunt     Social History:  Social History   Social History  . Marital status: Single    Spouse name: N/A  . Number of children: N/A  . Years of education: N/A   Social History Main Topics  . Smoking status: Current Every Day Smoker    Packs/day: 1.00    Years: 22.00    Types: Cigarettes  . Smokeless tobacco: Never Used     Comment: Recently started a smoking cessation class.   . Alcohol use No     Comment:  hx alcohollism  in remission since 2014  . Drug use: No     Comment: hx  marijuana use  . Sexual activity: Yes    Partners: Male    Birth control/ protection: Surgical   Other Topics Concern  . Not on file   Social History Narrative   Former [facility] patient.      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    In school at [employer] to become Psychologist, sport and exercise.     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      Last updated   01/23/2017       Allergies:  Allergies  Allergen Reactions  . Depakote [Divalproex Sodium] Nausea And Vomiting  . Minocycline Hives  . Aspirin Hives    States able to tolerate Goody Powders without any problem except for GI upset    Metabolic Disorder Labs: Lab Results  Component Value  Date   HGBA1C 5.2 11/10/2016   MPG 103 11/10/2016   No results found for: PROLACTIN Lab Results  Component Value Date   CHOL 197 06/13/2008   TRIG 160 (H) 06/13/2008   HDL 42 06/13/2008   CHOLHDL 4.7 Ratio 06/13/2008   VLDL 32 06/13/2008   LDLCALC 123 (H) 06/13/2008   Lab Results  Component Value Date   TSH 0.50 12/24/2015   TSH 0.429 07/25/2014    Therapeutic Level Labs: No results found for: LITHIUM No results found for: VALPROATE No components found for:  CBMZ  Current Medications: Current Outpatient Prescriptions  Medication Sig Dispense Refill  . acetaminophen (TYLENOL) 500 MG tablet Take 1,000 mg by mouth every 6 (six) hours as needed for moderate pain or fever.    Marland Kitchen buPROPion (WELLBUTRIN XL) 150 MG 24 hr tablet Take 1 tablet (150 mg total) by mouth daily. 90 tablet 0  . buPROPion (WELLBUTRIN XL) 300 MG 24 hr tablet Take 1 tablet (300 mg total) by mouth every morning. 90 tablet 0  . docusate sodium (COLACE) 100 MG capsule Take 1 capsule (100 mg total) by mouth 2 (two) times daily. (Patient taking differently: Take 100 mg by mouth every morning. ) 10 capsule 0  . gabapentin (NEURONTIN) 300 MG capsule Take 1 capsule (300 mg total) by mouth 3 (three) times daily. Bid for first week then tid 90 capsule 5  . LORazepam (ATIVAN) 1 MG tablet Take 1 tablet (1 mg total) by mouth 2 (two) times daily. 60 tablet 2  . Multiple Vitamin (MULTIVITAMIN) tablet Take 1 tablet by mouth daily.    Marland Kitchen OLANZapine (ZYPREXA) 10 MG tablet Take 1 tablet (10 mg total) by mouth at bedtime. 30 tablet 2  . omeprazole (PRILOSEC) 40 MG capsule Take 1  capsule (40 mg total) by mouth 2 (two) times daily. 180 capsule 3  . ondansetron (ZOFRAN) 4 MG tablet Take 1 tablet (4 mg total) by mouth every 8 (eight) hours as needed for nausea or vomiting. 30 tablet 0  . topiramate (TOPAMAX) 50 MG tablet Take 1 tablet (50 mg total) by mouth daily. 90 tablet 0  . traZODone (DESYREL) 100 MG tablet Take 1 tablet (100 mg total) by mouth at bedtime. 90 tablet 0  . valACYclovir (VALTREX) 500 MG tablet TAKE 1 TABLET BY MOUTH ONCE DAILY AS NEEDED FOR BREAKOUTS. 30 tablet 0  . valACYclovir (VALTREX) 500 MG tablet TAKE 1 TABLET BY MOUTH DAILY AS NEEDED FOR BREAKOUTS 30 tablet 0   No current facility-administered medications for this visit.      Musculoskeletal: Strength & Muscle Tone: within normal limits Gait & Station: normal Patient leans: N/A  Psychiatric Specialty Exam: Review of Systems  Constitutional: Negative.   HENT: Negative.   Musculoskeletal: Positive for back pain and neck pain.  Skin: Negative.  Negative for itching and rash.  Neurological:       Numbness in left hand    Blood pressure 140/80, pulse 96, height 5' 5.5" (1.664 m), weight 221 lb 9.6 oz (100.5 kg).Body mass index is 36.32 kg/m.  General Appearance: Casual  Eye Contact:  Good  Speech:  Clear and Coherent  Volume:  Normal  Mood:  Euthymic  Affect:  Appropriate  Thought Process:  Goal Directed  Orientation:  Full (Time, Place, and Person)  Thought Content: Logical   Suicidal Thoughts:  No  Homicidal Thoughts:  No  Memory:  Immediate;   Good Recent;   Good Remote;   Good  Judgement:  Good  Insight:  Good  Psychomotor Activity:  Normal  Concentration:  Concentration: Good and Attention Span: Good  Recall:  Good  Fund of Knowledge: Good  Language: Good  Akathisia:  No  Handed:  Right  AIMS (if indicated): not done  Assets:  Communication Skills Desire for Improvement Housing Resilience Social Support  ADL's:  Intact  Cognition: WNL  Sleep:  Fair    Screenings: PHQ2-9     Office Visit from 03/29/2017 in [place] Office Visit from 02/08/2017 in [place] Office Visit from 07/04/2016 in [place] Counselor from 02/16/2016 in [place] Office Visit from 12/24/2015 in [place]  PHQ-2 Total Score  0  0  1  6  0  PHQ-9 Total Score  -  -  1  23  -      Assessment: Bipolar disorder type I.  Plan: Patient feeling better since Wellbutrin dose increased.  She has no tremors, shakes or any EPS.  Her energy level is improved from the past.  She still complaining of chronic pain and tingling but hoping to switching job may help her pain.  I also recommended to get nutritional consult and she promises that she will discuss with her primary care physician.  I will continue Wellbutrin XL 300 mg daily and Wellbutrin XL 150 mg daily.  She will continue Zyprexa 10 mg at bedtime, trazodone 100 mg at bedtime, Topamax 50 mg at bedtime and Ativan 1 mg twice a day.  She's also getting gabapentin from primary care physician.  Discussed my dictation side effects and benefits.  Recommended to call us back if she has any question or any concern.  Follow-up in 3 months.  Maelin Kurkowski T., MD 04/27/2017, 2:48 PM

## 2017-06-01 ENCOUNTER — Other Ambulatory Visit: Payer: Self-pay | Admitting: Internal Medicine

## 2017-06-03 DIAGNOSIS — Z23 Encounter for immunization: Secondary | ICD-10-CM | POA: Diagnosis not present

## 2017-07-25 ENCOUNTER — Ambulatory Visit (HOSPITAL_COMMUNITY): Payer: Self-pay | Admitting: Psychiatry

## 2017-08-08 ENCOUNTER — Ambulatory Visit (HOSPITAL_COMMUNITY): Payer: Self-pay | Admitting: Psychiatry

## 2017-08-20 ENCOUNTER — Other Ambulatory Visit (HOSPITAL_COMMUNITY): Payer: Self-pay | Admitting: Psychiatry

## 2017-08-20 DIAGNOSIS — F319 Bipolar disorder, unspecified: Secondary | ICD-10-CM

## 2017-10-23 ENCOUNTER — Ambulatory Visit (INDEPENDENT_AMBULATORY_CARE_PROVIDER_SITE_OTHER): Payer: 59 | Admitting: Psychiatry

## 2017-10-23 ENCOUNTER — Encounter (HOSPITAL_COMMUNITY): Payer: Self-pay | Admitting: Psychiatry

## 2017-10-23 DIAGNOSIS — F419 Anxiety disorder, unspecified: Secondary | ICD-10-CM

## 2017-10-23 DIAGNOSIS — Z818 Family history of other mental and behavioral disorders: Secondary | ICD-10-CM

## 2017-10-23 DIAGNOSIS — F17210 Nicotine dependence, cigarettes, uncomplicated: Secondary | ICD-10-CM | POA: Diagnosis not present

## 2017-10-23 DIAGNOSIS — Z811 Family history of alcohol abuse and dependence: Secondary | ICD-10-CM

## 2017-10-23 DIAGNOSIS — F319 Bipolar disorder, unspecified: Secondary | ICD-10-CM

## 2017-10-23 DIAGNOSIS — Z813 Family history of other psychoactive substance abuse and dependence: Secondary | ICD-10-CM

## 2017-10-23 MED ORDER — BUPROPION HCL ER (XL) 300 MG PO TB24: 300.0000 mg | ORAL_TABLET | ORAL | 0 refills | Status: DC

## 2017-10-23 MED ORDER — LORAZEPAM 1 MG PO TABS: 1.0000 mg | ORAL_TABLET | Freq: Two times a day (BID) | ORAL | 2 refills | Status: DC

## 2017-10-23 MED ORDER — OLANZAPINE 10 MG PO TABS: 10.0000 mg | ORAL_TABLET | Freq: Every day | ORAL | 0 refills | Status: DC

## 2017-10-23 MED ORDER — TRAZODONE HCL 100 MG PO TABS: 100.0000 mg | ORAL_TABLET | Freq: Every day | ORAL | 0 refills | Status: DC

## 2017-10-23 MED FILL — BUPROPION HCL XL 300 MG TAB: 300 | 90 days supply | Qty: 90 | Fill #0

## 2017-10-23 MED FILL — LORazepam 1 MG TABS: 1 | 30 days supply | Qty: 60 | Fill #0

## 2017-10-23 MED FILL — OLANZapine 10 MG TABS: 10 | 90 days supply | Qty: 90 | Fill #0

## 2017-10-23 MED FILL — traZODone HCL 100 MG TABS: 100 | 90 days supply | Qty: 90 | Fill #0

## 2017-10-23 NOTE — Progress Notes (Signed)
BH MD/PA/NP OP Progress Note  10/23/2017 12:15 PM Sandra Miller  MRN:  [id number]  Chief Complaint: I have been out of my medication.  I ran out from insurance but recently I got a job at [employer] and part-time at [employer].  HPI: Sandra Miller came for her follow-up appointment.  She has been poorly compliant with medication for past few months.  She was last seen in August.  She quit her job and ran out from [employer].  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  She is very happy and excited but admitted to do poorly compliant with medication she is been very depressed, irritable, agitated and having bad mood swings.  She was taking half of Zyprexa and Wellbutrin and occasionally Ativan.  Her anxiety is very high.  She is also very concerned about her son who recently broke up with the girlfriend.  She is in a process of moving from to bedroom to one bedroom and that is also very stressful.  She realized that she need to take her medication as prescribed to avoid any relapse.  Though she denies any hallucination, paranoia or any suicidal thoughts but admitted irritability, frustration and anger issues.  Patient denies drinking alcohol or using any illegal substances.  Her appetite is okay.  She has not seen her primary care physician in a while.  She still have numbness in her left hand but her back pain is much better.  Visit Diagnosis:    ICD-10-CM   1. Bipolar 1 disorder (HCC) F31.9 LORazepam (ATIVAN) 1 MG tablet    traZODone (DESYREL) 100 MG tablet    OLANZapine (ZYPREXA) 10 MG tablet    buPROPion (WELLBUTRIN XL) 300 MG 24 hr tablet    DISCONTINUED: traZODone (DESYREL) 100 MG tablet    DISCONTINUED: OLANZapine (ZYPREXA) 10 MG tablet    DISCONTINUED: buPROPion (WELLBUTRIN XL) 300 MG 24 hr tablet    Past Psychiatric History: Updated. Patient has history of bipolar disorder, alcohol  dependence and marijuana abuse. She has at least 6 psychiatric hospitalization. In the past she had tried lithium, Seroquel, Lamictal, Paxil, Celexa, Abilify , Geodon Depakote, Neurontin and Cymbalta. Patient denies any history of suicidal attempt but endorsed history of aggressive behavior and poor impulse control.  Past Medical History:  Past Medical History:  Diagnosis Date  . Bipolar 1 disorder ([place])   . GAD (generalized anxiety disorder)   . Genital HSV    currently per pt  no break out 03-22-2016   . GERD (gastroesophageal reflux disease)   . Hiatal hernia   . History of cervical dysplasia    2012 laser ablation  . History of esophageal dilatation    for dysphasia -- x2 dilated  . History of gastric ulcer   . History of Helicobacter pylori infection    remote hx  . History of hidradenitis suppurativa    "gets all over body intermittantly"    . History of hypertension    no issue since stopped drinking alcohol 2014  . History of kidney stones   . History of panic attacks   . Iron deficiency anemia   . Left ureteral stone   . OCD (obsessive compulsive disorder)   . PTSD (post-traumatic stress disorder)   . Recovering alcoholic in remission ([place])    since 2014  . RLS (restless legs syndrome)   . Smokers' cough ([place])   .  Urgency of urination   . Yeast infection involving the vagina and surrounding area    secondary to taking antibiotic    Past Surgical History:  Procedure Laterality Date  . CESAREAN SECTION  1995   w/  Bilateral Tubal Ligation  . COLONOSCOPY  last one 08-09-2013  . CYSTOSCOPY W/ URETERAL STENT PLACEMENT Left 03/29/2016   Procedure: CYSTOSCOPY WITH STENT REPLACEMENT;  Surgeon: Nickie Retort, MD;  Location: [facility];  Service: Urology;  Laterality: Left;  . CYSTOSCOPY WITH RETROGRADE PYELOGRAM, URETEROSCOPY AND STENT PLACEMENT Left 03/08/2016   Procedure: CYSTOSCOPY WITH  LEFT RETROGRADE PYELOGRAM, AND STENT PLACEMENT;  Surgeon:  Nickie Retort, MD;  Location[facility];  Service: Urology;  Laterality: Left;  . CYSTOSCOPY/RETROGRADE/URETEROSCOPY/STONE EXTRACTION WITH BASKET Left 03/29/2016   Procedure: CYSTOSCOPY/RETROGRADE/URETEROSCOPY/STONE EXTRACTION WITH BASKET;  Surgeon: Nickie Retort, MD;  Location: [facility];  Service: Urology;  Laterality: Left;  . ENDOMETRIAL ABLATION W/ NOVASURE  04-01-2010  . ESOPHAGOGASTRODUODENOSCOPY  last one 08-09-2013  . KNEE ARTHROSCOPY Left as teen  . LASER ABLATION OF THE CERVIX  2012 approx  . TRANSTHORACIC ECHOCARDIOGRAM  05-19-2006   lvsf normal, ef 55-65%, there was mild flattening of the interventricular septum during diastoli/  RV size at upper limits normal  . WISDOM TOOTH EXTRACTION  age 65 's    Family Psychiatric History: Updated.  Family History:  Family History  Problem Relation Age of Onset  . Heart disease Father   . Lung cancer Father   . Alcohol abuse Father   . Heart disease Mother   . Depression Mother   . Anxiety disorder Mother   . Drug abuse Brother   . Alcohol abuse Brother   . Drug abuse Brother   . ADD / ADHD Brother   . Colon cancer Brother        malignant polyp < 60 in 1  . Stomach cancer Paternal Grandfather   . Diabetes Maternal Grandfather   . Diabetes Paternal Grandmother   . Kidney disease Maternal Uncle   . Cirrhosis Cousin        alcoholic  . Anxiety disorder Maternal Aunt   . Depression Maternal Aunt     Social History:  Social History   Socioeconomic History  . Marital status: Single    Spouse name: Not on file  . Number of children: Not on file  . Years of education: Not on file  . Highest education level: Not on file  Social Needs  . Financial resource strain: Not on file  . Food insecurity - worry: Not on file  . Food insecurity - inability: Not on file  . Transportation needs - medical: Not on file  . Transportation needs - non-medical: Not on file  Occupational History  . Not on file   Tobacco Use  . Smoking status: Current Every Day Smoker    Packs/day: 1.00    Years: 22.00    Pack years: 22.00    Types: Cigarettes  . Smokeless tobacco: Never Used  . Tobacco comment: Recently started a smoking cessation class.   Substance and Sexual Activity  . Alcohol use: No    Alcohol/week: 0.0 oz    Comment:  hx alcohollism  in remission since 2014  . Drug use: No    Comment: hx  marijuana use  . Sexual activity: Yes    Partners: Male    Birth control/protection: Surgical  Other Topics Concern  . Not on file  Social History  Narrative   Former [facility] patient.      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    In school at [employer] to become Psychologist, sport and exercise.     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      Last updated   01/23/2017    Allergies:  Allergies  Allergen Reactions  . Depakote [Divalproex Sodium] Nausea And Vomiting  . Minocycline Hives  . Aspirin Hives    States able to tolerate Goody Powders without any problem except for GI upset    Metabolic Disorder Labs: Lab Results  Component Value Date   HGBA1C 5.2 11/10/2016   MPG 103 11/10/2016   No results found for: PROLACTIN Lab Results  Component Value Date   CHOL 197 06/13/2008   TRIG 160 (H) 06/13/2008   HDL 42 06/13/2008   CHOLHDL 4.7 Ratio 06/13/2008   VLDL 32 06/13/2008   LDLCALC 123 (H) 06/13/2008   Lab Results  Component Value Date   TSH 0.50 12/24/2015   TSH 0.429 07/25/2014    Therapeutic Level Labs: No results found for: LITHIUM No results found for: VALPROATE No components found for:  CBMZ  Current Medications: Current Outpatient Medications  Medication Sig Dispense Refill  . acetaminophen (TYLENOL) 500 MG tablet Take 1,000 mg by mouth every 6 (six) hours as needed for moderate pain or fever.    Marland Kitchen buPROPion (WELLBUTRIN XL) 150 MG 24 hr tablet Take 1 tablet (150 mg total) by mouth  daily. 90 tablet 0  . buPROPion (WELLBUTRIN XL) 300 MG 24 hr tablet Take 1 tablet (300 mg total) by mouth every morning. 90 tablet 0  . docusate sodium (COLACE) 100 MG capsule Take 1 capsule (100 mg total) by mouth 2 (two) times daily. (Patient taking differently: Take 100 mg by mouth every morning. ) 10 capsule 0  . gabapentin (NEURONTIN) 300 MG capsule Take 1 capsule (300 mg total) by mouth 3 (three) times daily. Bid for first week then tid 90 capsule 5  . LORazepam (ATIVAN) 1 MG tablet Take 1 tablet (1 mg total) by mouth 2 (two) times daily. 60 tablet 2  . Multiple Vitamin (MULTIVITAMIN) tablet Take 1 tablet by mouth daily.    Marland Kitchen OLANZapine (ZYPREXA) 10 MG tablet Take 1 tablet (10 mg total) by mouth at bedtime. 30 tablet 2  . omeprazole (PRILOSEC) 40 MG capsule Take 1 capsule (40 mg total) by mouth 2 (two) times daily. 180 capsule 3  . ondansetron (ZOFRAN) 4 MG tablet Take 1 tablet (4 mg total) by mouth every 8 (eight) hours as needed for nausea or vomiting. 30 tablet 0  . topiramate (TOPAMAX) 50 MG tablet Take 1 tablet (50 mg total) by mouth daily. 90 tablet 0  . traZODone (DESYREL) 100 MG tablet Take 1 tablet (100 mg total) by mouth at bedtime. 90 tablet 0  . valACYclovir (VALTREX) 500 MG tablet TAKE 1 TABLET BY MOUTH ONCE DAILY AS NEEDED FOR BREAKOUTS. 30 tablet 0  . valACYclovir (VALTREX) 500 MG tablet Take 1 tablet (500 mg total) by mouth daily. 90 tablet 3   No current facility-administered medications for this visit.      Musculoskeletal: Strength & Muscle Tone: within normal limits Gait & Station: normal Patient leans: N/A  Psychiatric Specialty Exam: ROS  Blood pressure 124/78, pulse 86, height 5' 5.5" (1.664 m),  weight 217 lb (98.4 kg).There is no height or weight on file to calculate BMI.  General Appearance: Casual  Eye Contact:  Good  Speech:  Clear and Coherent  Volume:  Normal  Mood:  Anxious and Depressed  Affect:  Congruent  Thought Process:  Goal Directed   Orientation:  Full (Time, Place, and Person)  Thought Content: Logical and Rumination   Suicidal Thoughts:  No  Homicidal Thoughts:  No  Memory:  Immediate;   Good Recent;   Good Remote;   Good  Judgement:  Good  Insight:  Fair  Psychomotor Activity:  Normal  Concentration:  Concentration: Fair and Attention Span: Fair  Recall:  Good  Fund of Knowledge: Good  Language: Good  Akathisia:  No  Handed:  Right  AIMS (if indicated): not done  Assets:  Communication Skills Desire for Improvement Housing Physical Health Resilience  ADL's:  Intact  Cognition: WNL  Sleep:  Fair   Screenings: PHQ2-9     Office Visit from 03/29/2017 in [place] Office Visit from 02/08/2017 in [place] Office Visit from 07/04/2016 in [place] Counselor from 02/16/2016 in [place] Office Visit from 12/24/2015 in [place]  PHQ-2 Total Score  0  0  1  6  0  PHQ-9 Total Score  No data  No data  1  23  No data       Assessment and Plan: Bipolar disorder type I.  Anxiety disorder NOS.  Reassurance given.  Reinforced to take the medication as prescribed to avoid relapse.  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  We will restart Zyprexa 10 mg at bedtime, trazodone 100 mg at bedtime, Ativan 1 mg twice a day and Wellbutrin XL 300 mg daily.  She is no longer taking trazodone.  Reinforced to see her primary care physician for chronic pain and numbness in her hand.  And gabapentin for her back pain.  I also encouraged to see a therapist and patient will look into that and call us back if she needed.  Discussed safety concerns at any time having active suicidal thoughts or homicidal thought that she need to call 911 or go to local  emergency room.  Follow-up in 3 months.   Kathlee Nations, MD 10/23/2017, 12:16 PM

## 2017-11-08 NOTE — Telephone Encounter (Signed)
This encounter was created in error - please disregard.

## 2017-11-14 ENCOUNTER — Ambulatory Visit (HOSPITAL_COMMUNITY): Payer: Self-pay | Admitting: Psychiatry

## 2017-12-15 MED FILL — LORazepam 1 MG TABS: 1 | 30 days supply | Qty: 60 | Fill #1 | Status: TO

## 2017-12-18 MED FILL — OMEPRAZOLE DR 40 MG CAPSULE: 40 | 90 days supply | Qty: 180 | Fill #0

## 2017-12-27 ENCOUNTER — Ambulatory Visit (HOSPITAL_COMMUNITY)
Admission: EM | Admit: 2017-12-27 | Discharge: 2017-12-27 | Disposition: A | Discharge status: Home / Self Care | Payer: Self-pay | Attending: Family Medicine | Admitting: Family Medicine

## 2017-12-27 ENCOUNTER — Encounter (HOSPITAL_COMMUNITY): Payer: Self-pay | Admitting: Emergency Medicine

## 2017-12-27 DIAGNOSIS — F319 Bipolar disorder, unspecified: Secondary | ICD-10-CM | POA: Insufficient documentation

## 2017-12-27 DIAGNOSIS — R109 Unspecified abdominal pain: Secondary | ICD-10-CM | POA: Insufficient documentation

## 2017-12-27 DIAGNOSIS — I10 Essential (primary) hypertension: Secondary | ICD-10-CM | POA: Insufficient documentation

## 2017-12-27 DIAGNOSIS — M79604 Pain in right leg: Secondary | ICD-10-CM | POA: Insufficient documentation

## 2017-12-27 DIAGNOSIS — M7989 Other specified soft tissue disorders: Secondary | ICD-10-CM | POA: Insufficient documentation

## 2017-12-27 DIAGNOSIS — M79605 Pain in left leg: Secondary | ICD-10-CM | POA: Insufficient documentation

## 2017-12-27 LAB — COMPREHENSIVE METABOLIC PANEL
ALT: 12 U/L — AB (ref 14–54)
AST: 27 U/L (ref 15–41)
Albumin: 4.3 g/dL (ref 3.5–5.0)
Alkaline Phosphatase: 56 U/L (ref 38–126)
Anion gap: 9 (ref 5–15)
BILIRUBIN TOTAL: 1 mg/dL (ref 0.3–1.2)
BUN: 10 mg/dL (ref 6–20)
CALCIUM: 9.5 mg/dL (ref 8.9–10.3)
CHLORIDE: 103 mmol/L (ref 101–111)
CO2: 25 mmol/L (ref 22–32)
CREATININE: 0.93 mg/dL (ref 0.44–1.00)
Glucose, Bld: 99 mg/dL (ref 65–99)
Potassium: 4 mmol/L (ref 3.5–5.1)
Sodium: 137 mmol/L (ref 135–145)
Total Protein: 7.3 g/dL (ref 6.5–8.1)

## 2017-12-27 LAB — POCT URINALYSIS DIP (DEVICE)
Bilirubin Urine: NEGATIVE
Glucose, UA: NEGATIVE mg/dL
Ketones, ur: NEGATIVE mg/dL
LEUKOCYTES UA: NEGATIVE
NITRITE: NEGATIVE
Protein, ur: NEGATIVE mg/dL
UROBILINOGEN UA: 0.2 mg/dL (ref 0.0–1.0)
pH: 7 (ref 5.0–8.0)

## 2017-12-27 MED ORDER — DICLOFENAC SODIUM 75 MG PO TBEC: 75.0000 mg | DELAYED_RELEASE_TABLET | Freq: Two times a day (BID) | ORAL | 0 refills | Status: AC

## 2017-12-27 NOTE — Discharge Instructions (Signed)
We are checking your liver and kidney function.   Please pick up a pair of compression stockings, you may wear when working at your job where you have to stand, or when at home.  Continue to elevate your legs frequently.   I sent in diclofenac, but you may also use Tylenol or ibuprofen to help with some of the pain and swelling.  Please return if you are having worsening swelling, worsening pain, developing worsening symptoms on one leg, developing overlying redness of the skin.  Return if developing shortness of breath, cough, difficulty breathing or chest pain.

## 2017-12-27 NOTE — ED Triage Notes (Signed)
Pt states "my feet have been swollen all week, they hurt so bad, I googled symptoms and it said possible kidneys, I have had some lower abdominal pain, on the weekends I walk 8 miles a day at work"

## 2017-12-27 NOTE — ED Provider Notes (Addendum)
[facility]    CSN: [id number] Arrival date & time: 12/27/17  1758     History   Chief Complaint Chief Complaint  Patient presents with  . Foot Swelling  . Abdominal Pain    HPI Sandra Miller is a 42 y.o. female history of tobacco use, anxiety, bipolar type I presenting today with concern for bilateral feet swelling.  States that she has had pain in her feet and mild swelling off and on for the past couple months, but has noticed this worsening over the past week.  Worsens a end of day.  Patient decided to come today as she was googling causes and was concerned about her kidneys and liver.  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  Will elevate her legs above her heart after work.  Patient also concerned about possible kidney stone as a cause of her swelling, denies any decreased urine output, hematuria, dysuria, increased frequency.  Denies any back pain.  Denies any shortness of breath, cough or difficulty breathing.  Denies any increase in fluid/size of her abdomen.  Denies history of DVT/PE.  HPI  Past Medical History:  Diagnosis Date  . Bipolar 1 disorder ([place])   . GAD (generalized anxiety disorder)   . Genital HSV    currently per pt  no break out 03-22-2016   . GERD (gastroesophageal reflux disease)   . Hiatal hernia   . History of cervical dysplasia    2012 laser ablation  . History of esophageal dilatation    for dysphasia -- x2 dilated  . History of gastric ulcer   . History of Helicobacter pylori infection    remote hx  . History of hidradenitis suppurativa    "gets all over body intermittantly"    . History of hypertension    no issue since stopped drinking alcohol 2014  . History of kidney stones   . History of panic attacks   . Iron deficiency anemia   . Left ureteral stone   . OCD (obsessive compulsive disorder)   . PTSD (post-traumatic stress disorder)   . Recovering  alcoholic in remission ([name])    since 2014  . RLS (restless legs syndrome)   . Smokers' cough ([place])   . Urgency of urination   . Yeast infection involving the vagina and surrounding area    secondary to taking antibiotic    Patient Active Problem List   Diagnosis Date Noted  . Viral illness 02/08/2017  . Restless leg syndrome 04/01/2016  . Nephrolithiasis 03/08/2016  . Dysfunctional uterine bleeding 03/08/2016  . Obstructive pyelonephritis 03/08/2016  . Hypokalemia 03/08/2016  . Normocytic anemia 03/08/2016  . UTI (lower urinary tract infection)   . Healthcare maintenance 12/24/2015  . Hidradenitis suppurativa of left axilla 11/03/2015  . Loss of weight 09/08/2015  . Neck pain on left side 10/22/2014  . Genital warts 05/20/2014  . GERD (gastroesophageal reflux disease) 12/26/2012  . Tobacco abuse 08/22/2012  . H/O: HTN (hypertension) 03/19/2009  . Herpes simplex virus (HSV) infection 02/03/2007  . DISORDER, BIPOLAR NOS 02/03/2007    Past Surgical History:  Procedure Laterality Date  . CESAREAN SECTION  1995   w/  Bilateral Tubal Ligation  . COLONOSCOPY  last one 08-09-2013  . CYSTOSCOPY W/ URETERAL STENT PLACEMENT Left 03/29/2016   Procedure: CYSTOSCOPY WITH STENT REPLACEMENT;  Surgeon: Nickie Retort, MD;  [facility];  Service: Urology;  Laterality: Left;  . CYSTOSCOPY WITH RETROGRADE PYELOGRAM, URETEROSCOPY AND STENT PLACEMENT Left 03/08/2016   Procedure: CYSTOSCOPY WITH  LEFT RETROGRADE PYELOGRAM, AND STENT PLACEMENT;  Surgeon: Nickie Retort, MD;  [facility];  Service: Urology;  Laterality: Left;  . CYSTOSCOPY/RETROGRADE/URETEROSCOPY/STONE EXTRACTION WITH BASKET Left 03/29/2016   Procedure: CYSTOSCOPY/RETROGRADE/URETEROSCOPY/STONE EXTRACTION WITH BASKET;  Surgeon: Nickie Retort, MD;  Location: [facility];  Service: Urology;  Laterality: Left;  . ENDOMETRIAL ABLATION W/ NOVASURE  04-01-2010  .  ESOPHAGOGASTRODUODENOSCOPY  last one 08-09-2013  . KNEE ARTHROSCOPY Left as teen  . LASER ABLATION OF THE CERVIX  2012 approx  . TRANSTHORACIC ECHOCARDIOGRAM  05-19-2006   lvsf normal, ef 55-65%, there was mild flattening of the interventricular septum during diastoli/  RV size at upper limits normal  . WISDOM TOOTH EXTRACTION  age 59 's    OB History   None      Home Medications    Prior to Admission medications   Medication Sig Start Date End Date Taking? Authorizing Provider  acetaminophen (TYLENOL) 500 MG tablet Take 1,000 mg by mouth every 6 (six) hours as needed for moderate pain or fever.    [provider]  buPROPion (WELLBUTRIN XL) 300 MG 24 hr tablet Take 1 tablet (300 mg total) by mouth every morning. 10/23/17 10/23/18  Arfeen, Arlyce Harman, MD  diclofenac (VOLTAREN) 75 MG EC tablet Take 1 tablet (75 mg total) by mouth 2 (two) times daily for 15 days. 12/27/17 01/11/18  Wieters, Hallie C, PA-C  docusate sodium (COLACE) 100 MG capsule Take 1 capsule (100 mg total) by mouth 2 (two) times daily. Patient taking differently: Take 100 mg by mouth every morning.  03/10/16   Donne Hazel, MD  gabapentin (NEURONTIN) 300 MG capsule Take 1 capsule (300 mg total) by mouth 3 (three) times daily. Bid for first week then tid 01/23/17   Gatha Mayer, MD  LORazepam (ATIVAN) 1 MG tablet Take 1 tablet (1 mg total) by mouth 2 (two) times daily. 10/23/17   Arfeen, Arlyce Harman, MD  Multiple Vitamin (MULTIVITAMIN) tablet Take 1 tablet by mouth daily.    [provider]  OLANZapine (ZYPREXA) 10 MG tablet Take 1 tablet (10 mg total) by mouth at bedtime. 10/23/17 10/23/18  Arfeen, Arlyce Harman, MD  omeprazole (PRILOSEC) 40 MG capsule Take 1 capsule (40 mg total) by mouth 2 (two) times daily. 01/23/17   Gatha Mayer, MD  traZODone (DESYREL) 100 MG tablet Take 1 tablet (100 mg total) by mouth at bedtime. 10/23/17   Arfeen, Arlyce Harman, MD  valACYclovir (VALTREX) 500 MG tablet Take 1 tablet (500 mg total) by  mouth daily. 06/02/17   Verner Mould, MD    Family History Family History  Problem Relation Age of Onset  . Heart disease Father   . Lung cancer Father   . Alcohol abuse Father   . Heart disease Mother   . Depression Mother   . Anxiety disorder Mother   . Drug abuse Brother   . Alcohol abuse Brother   . Drug abuse Brother   . ADD / ADHD Brother   . Colon cancer Brother        malignant polyp < 60 in 1  . Stomach cancer Paternal Grandfather   . Diabetes Maternal Grandfather   . Diabetes Paternal Grandmother   . Kidney disease Maternal Uncle   . Cirrhosis Cousin        alcoholic  .  Anxiety disorder Maternal Aunt   . Depression Maternal Aunt     Social History Social History   Tobacco Use  . Smoking status: Current Every Day Smoker    Packs/day: 1.00    Years: 22.00    Pack years: 22.00    Types: Cigarettes  . Smokeless tobacco: Never Used  . Tobacco comment: Recently started a smoking cessation class.   Substance Use Topics  . Alcohol use: No    Alcohol/week: 0.0 oz    Comment:  hx alcohollism  in remission since 2014  . Drug use: No    Comment: hx  marijuana use     Allergies   Depakote [divalproex sodium]; Minocycline; and Aspirin   Review of Systems Review of Systems  Constitutional: Negative for activity change, appetite change and fever.  Respiratory: Negative for cough, chest tightness and shortness of breath.   Cardiovascular: Positive for leg swelling. Negative for chest pain.  Gastrointestinal: Negative for abdominal pain, nausea and vomiting.  Genitourinary: Negative for decreased urine volume, difficulty urinating, dysuria and hematuria.  Musculoskeletal: Positive for arthralgias, joint swelling and myalgias. Negative for back pain and gait problem.  Skin: Positive for color change. Negative for wound.  Neurological: Negative for dizziness, weakness, light-headedness and headaches.     Physical Exam Triage Vital Signs ED Triage  Vitals [12/27/17 1823]  Enc Vitals Group     BP (!) 152/81     Pulse Rate (!) 115     Resp 20     Temp 98.9 F (37.2 C)     Temp src      SpO2 100 %     Weight      Height      Head Circumference      Peak Flow      Pain Score      Pain Loc      Pain Edu?      Excl. in [place]?    No data found.  Updated Vital Signs BP (!) 152/81   Pulse (!) 115   Temp 98.9 F (37.2 C)   Resp 20   SpO2 100%  Heart rate rechecked before discharge and was 93. Visual Acuity Right Eye Distance:   Left Eye Distance:   Bilateral Distance:    Right Eye Near:   Left Eye Near:    Bilateral Near:     Physical Exam  Constitutional: She appears well-developed and well-nourished. No distress.  HENT:  Head: Normocephalic and atraumatic.  Eyes: Conjunctivae are normal.  Neck: Neck supple.  Cardiovascular: Normal rate and regular rhythm.  No murmur heard. Pulmonary/Chest: Effort normal and breath sounds normal. No respiratory distress.  Abdominal: Soft. There is no tenderness.  Nontender to deep palpation, no sign of ascites, negative fluid wave.  Musculoskeletal: She exhibits edema.  Mild nonpitting edema to bilateral feet, does not extend into calves/shins.  Nontender to palpation of calves, no overlying erythema.  Mild erythema overlying first MTP on bilateral feet.  Appears more inflammatory instead of cellulitic.  Cap refill less than 2 seconds.  No chronic vascular changes on skin.  Neurological: She is alert.  Skin: Skin is warm and dry.  Psychiatric: She has a normal mood and affect.  Nursing note and vitals reviewed.    UC Treatments / Results  Labs (all labs ordered are listed, but only abnormal results are displayed) Labs Reviewed  POCT URINALYSIS DIP (DEVICE) - Abnormal; Notable for the following components:      Result Value  Hgb urine dipstick TRACE (*)    All other components within normal limits  COMPREHENSIVE METABOLIC PANEL    EKG None Radiology No results  found.  Procedures Procedures (including critical care time)  Medications Ordered in UC Medications - No data to display   Initial Impression / Assessment and Plan / UC Course  I have reviewed the triage vital signs and the nursing notes.  Pertinent labs & imaging results that were available during my care of the patient were reviewed by me and considered in my medical decision making (see chart for details).     Patient with bilateral lower extremity pain and swelling.  Appears to be related to dependent activities.  Will recommend to continue elevation, will add an compression stockings.  Will try diclofenac as an alternative to ibuprofen and Tylenol.  Discussed signs and symptoms of blood clot, feel at this time since it is bilateral this is less likely the patient is a smoker.  CMP obtained to check liver and kidney for patient. Discussed strict return precautions. Patient verbalized understanding and is agreeable with plan.   Final Clinical Impressions(s) / UC Diagnoses   Final diagnoses:  Bilateral lower extremity pain  Bilateral swelling of feet    ED Discharge Orders        Ordered    diclofenac (VOLTAREN) 75 MG EC tablet  2 times daily     12/27/17 1846       Controlled Substance Prescriptions  Controlled Substance Registry consulted? Not Applicable   Janith Lima, PA-C 12/27/17 1931    Janith Lima, [place] 12/27/17 1951

## 2017-12-28 ENCOUNTER — Telehealth (HOSPITAL_COMMUNITY): Payer: Self-pay

## 2017-12-28 MED FILL — DICLOFENAC SOD EC 75 MG TAB: 75 | 15 days supply | Qty: 30 | Fill #0

## 2017-12-28 NOTE — Telephone Encounter (Signed)
Attempted to reach patient regarding results. No answer at this time. Voicemail left.  Message sent to [place].

## 2018-01-23 ENCOUNTER — Encounter (HOSPITAL_COMMUNITY): Payer: Self-pay | Admitting: Psychiatry

## 2018-01-23 ENCOUNTER — Ambulatory Visit (HOSPITAL_COMMUNITY): Payer: 59 | Admitting: Psychiatry

## 2018-01-23 VITALS — BP 136/82 | HR 94 | Ht 65.5 in | Wt 221.4 lb

## 2018-01-23 DIAGNOSIS — Z5689 Other problems related to employment: Secondary | ICD-10-CM | POA: Diagnosis not present

## 2018-01-23 DIAGNOSIS — F17210 Nicotine dependence, cigarettes, uncomplicated: Secondary | ICD-10-CM | POA: Diagnosis not present

## 2018-01-23 DIAGNOSIS — Z811 Family history of alcohol abuse and dependence: Secondary | ICD-10-CM | POA: Diagnosis not present

## 2018-01-23 DIAGNOSIS — Z818 Family history of other mental and behavioral disorders: Secondary | ICD-10-CM | POA: Diagnosis not present

## 2018-01-23 DIAGNOSIS — F411 Generalized anxiety disorder: Secondary | ICD-10-CM | POA: Diagnosis not present

## 2018-01-23 DIAGNOSIS — Z813 Family history of other psychoactive substance abuse and dependence: Secondary | ICD-10-CM

## 2018-01-23 DIAGNOSIS — F319 Bipolar disorder, unspecified: Secondary | ICD-10-CM

## 2018-01-23 MED ORDER — LORAZEPAM 1 MG PO TABS: 1.0000 mg | ORAL_TABLET | Freq: Two times a day (BID) | ORAL | 2 refills | Status: DC

## 2018-01-23 MED ORDER — OLANZAPINE 10 MG PO TABS: 10.0000 mg | ORAL_TABLET | Freq: Every day | ORAL | 0 refills | Status: DC

## 2018-01-23 MED ORDER — BUPROPION HCL ER (XL) 300 MG PO TB24: 300.0000 mg | ORAL_TABLET | ORAL | 0 refills | Status: DC

## 2018-01-23 MED ORDER — TRAZODONE HCL 100 MG PO TABS: 100.0000 mg | ORAL_TABLET | Freq: Every day | ORAL | 0 refills | Status: DC

## 2018-01-23 NOTE — Progress Notes (Signed)
BH MD/PA/NP OP Progress Note  01/23/2018 4:34 PM Sandra Miller  MRN:  [id number]  Chief Complaint: I am doing better.  I am sleeping good.  Sometime my new job as a stressful.  HPI: Sandra Miller came for her follow-up appointment.  Since taking the medication she is more calm and sleeping better.  However her new job is sometimes very stressful.  She is working full-time at [employer] and also working at [employer] on the weekend.  She was stressful because she has no new insurance and she may not keep her primary care physician.  Overall she describes her mood is a stable.  She is very happy that her children live close by and she able to see them regularly.  Patient denies any paranoia, hallucination, agitation, anger, mania, psychosis or any hallucination.  She is proud that she is been sober from drinking for more than 6 years.  Her appetite is okay.  Recently she seen in the urgent care because of leg swelling.  She has comprehensive metabolic panel and urine test.  Her swelling is much better but if it comes back then she will see her primary care physician.  Visit Diagnosis:    ICD-10-CM   1. Bipolar 1 disorder (HCC) F31.9 traZODone (DESYREL) 100 MG tablet    OLANZapine (ZYPREXA) 10 MG tablet    buPROPion (WELLBUTRIN XL) 300 MG 24 hr tablet    Past Psychiatric History: Reviewed. Patient has history of bipolar disorder, alcohol dependence and marijuana abuse. She has at least 6 psychiatric hospitalization. In the past she had tried lithium, Seroquel, Lamictal, Paxil, Celexa, Abilify , Geodon Depakote, Neurontin and Cymbalta. Patient denies any history of suicidal attempt but endorsed history of aggressive behavior and poor impulse control.  Past Medical History:  Past Medical History:  Diagnosis Date  . Bipolar 1 disorder ([place])   . GAD (generalized anxiety disorder)   . Genital HSV    currently per pt  no break out 03-22-2016   . GERD (gastroesophageal reflux disease)   .  Hiatal hernia   . History of cervical dysplasia    2012 laser ablation  . History of esophageal dilatation    for dysphasia -- x2 dilated  . History of gastric ulcer   . History of Helicobacter pylori infection    remote hx  . History of hidradenitis suppurativa    "gets all over body intermittantly"    . History of hypertension    no issue since stopped drinking alcohol 2014  . History of kidney stones   . History of panic attacks   . Iron deficiency anemia   . Left ureteral stone   . OCD (obsessive compulsive disorder)   . PTSD (post-traumatic stress disorder)   . Recovering alcoholic in remission ([place])    since 2014  . RLS (restless legs syndrome)   . Smokers' cough ([place])   . Urgency of urination   . Yeast infection involving the vagina and surrounding area    secondary to taking antibiotic    Past Surgical History:  Procedure Laterality Date  . CESAREAN SECTION  1995   w/  Bilateral Tubal Ligation  . COLONOSCOPY  last one 08-09-2013  . CYSTOSCOPY W/ URETERAL STENT PLACEMENT Left 03/29/2016   Procedure: CYSTOSCOPY WITH STENT REPLACEMENT;  Surgeon: Nickie Retort, MD;  Location: [facility];  Service: Urology;  Laterality: Left;  . CYSTOSCOPY WITH RETROGRADE PYELOGRAM, URETEROSCOPY AND STENT PLACEMENT Left 03/08/2016   Procedure: CYSTOSCOPY WITH  LEFT RETROGRADE PYELOGRAM, AND STENT PLACEMENT;  Surgeon: Nickie Retort, MD;  Location: [facility];  Service: Urology;  Laterality: Left;  . CYSTOSCOPY/RETROGRADE/URETEROSCOPY/STONE EXTRACTION WITH BASKET Left 03/29/2016   Procedure: CYSTOSCOPY/RETROGRADE/URETEROSCOPY/STONE EXTRACTION WITH BASKET;  Surgeon: Nickie Retort, MD;  Location: [facility];  Service: Urology;  Laterality: Left;  . ENDOMETRIAL ABLATION W/ NOVASURE  04-01-2010  . ESOPHAGOGASTRODUODENOSCOPY  last one 08-09-2013  . KNEE ARTHROSCOPY Left as teen  . LASER ABLATION OF THE CERVIX  2012 approx  . TRANSTHORACIC ECHOCARDIOGRAM   05-19-2006   lvsf normal, ef 55-65%, there was mild flattening of the interventricular septum during diastoli/  RV size at upper limits normal  . WISDOM TOOTH EXTRACTION  age 26 's    Family Psychiatric History: Viewed.  Family History:  Family History  Problem Relation Age of Onset  . Heart disease Father   . Lung cancer Father   . Alcohol abuse Father   . Heart disease Mother   . Depression Mother   . Anxiety disorder Mother   . Drug abuse Brother   . Alcohol abuse Brother   . Drug abuse Brother   . ADD / ADHD Brother   . Colon cancer Brother        malignant polyp < 60 in 1  . Stomach cancer Paternal Grandfather   . Diabetes Maternal Grandfather   . Diabetes Paternal Grandmother   . Kidney disease Maternal Uncle   . Cirrhosis Cousin        alcoholic  . Anxiety disorder Maternal Aunt   . Depression Maternal Aunt     Social History:  Social History   Socioeconomic History  . Marital status: Single    Spouse name: Not on file  . Number of children: Not on file  . Years of education: Not on file  . Highest education level: Not on file  Occupational History  . Not on file  Social Needs  . Financial resource strain: Not on file  . Food insecurity:    Worry: Not on file    Inability: Not on file  . Transportation needs:    Medical: Not on file    Non-medical: Not on file  Tobacco Use  . Smoking status: Current Every Day Smoker    Packs/day: 1.00    Years: 22.00    Pack years: 22.00    Types: Cigarettes  . Smokeless tobacco: Never Used  . Tobacco comment: Recently started a smoking cessation class.   Substance and Sexual Activity  . Alcohol use: No    Alcohol/week: 0.0 oz    Comment:  hx alcohollism  in remission since 2014  . Drug use: No    Comment: hx  marijuana use  . Sexual activity: Yes    Partners: Male    Birth control/protection: Surgical  Lifestyle  . Physical activity:    Days per week: Not on file    Minutes per session: Not on file  .  Stress: Not on file  Relationships  . Social connections:    Talks on phone: Not on file    Gets together: Not on file    Attends religious service: Not on file    Active member of club or organization: Not on file    Attends meetings of clubs or organizations: Not on file    Relationship status: Not on file  Other Topics Concern  . Not on file  Social History Narrative   Former [facility] patient.  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    In school at [employer] to become Psychologist, sport and exercise.     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      Last updated   01/23/2017    Allergies:  Allergies  Allergen Reactions  . Depakote [Divalproex Sodium] Nausea And Vomiting  . Minocycline Hives  . Aspirin Hives    States able to tolerate Goody Powders without any problem except for GI upset    Metabolic Disorder Labs: Lab Results  Component Value Date   HGBA1C 5.2 11/10/2016   MPG 103 11/10/2016   No results found for: PROLACTIN Lab Results  Component Value Date   CHOL 197 06/13/2008   TRIG 160 (H) 06/13/2008   HDL 42 06/13/2008   CHOLHDL 4.7 Ratio 06/13/2008   VLDL 32 06/13/2008   LDLCALC 123 (H) 06/13/2008   Lab Results  Component Value Date   TSH 0.50 12/24/2015   TSH 0.429 07/25/2014    Therapeutic Level Labs: No results found for: LITHIUM No results found for: VALPROATE No components found for:  CBMZ  Current Medications: Current Outpatient Medications  Medication Sig Dispense Refill  . acetaminophen (TYLENOL) 500 MG tablet Take 1,000 mg by mouth every 6 (six) hours as needed for moderate pain or fever.    Marland Kitchen buPROPion (WELLBUTRIN XL) 300 MG 24 hr tablet Take 1 tablet (300 mg total) by mouth every morning. 90 tablet 0  . diclofenac (VOLTAREN) 75 MG EC tablet Take 75 mg by mouth 2 (two) times daily.    Marland Kitchen docusate sodium (COLACE) 100 MG capsule Take 1 capsule (100 mg total)  by mouth 2 (two) times daily. (Patient taking differently: Take 100 mg by mouth every morning. ) 10 capsule 0  . gabapentin (NEURONTIN) 300 MG capsule Take 1 capsule (300 mg total) by mouth 3 (three) times daily. Bid for first week then tid 90 capsule 5  . LORazepam (ATIVAN) 1 MG tablet Take 1 tablet (1 mg total) by mouth 2 (two) times daily. 60 tablet 2  . Multiple Vitamin (MULTIVITAMIN) tablet Take 1 tablet by mouth daily.    Marland Kitchen OLANZapine (ZYPREXA) 10 MG tablet Take 1 tablet (10 mg total) by mouth at bedtime. 90 tablet 0  . omeprazole (PRILOSEC) 40 MG capsule Take 1 capsule (40 mg total) by mouth 2 (two) times daily. 180 capsule 3  . traZODone (DESYREL) 100 MG tablet Take 1 tablet (100 mg total) by mouth at bedtime. 90 tablet 0  . valACYclovir (VALTREX) 500 MG tablet Take 1 tablet (500 mg total) by mouth daily. 90 tablet 3   No current facility-administered medications for this visit.      Musculoskeletal: Strength & Muscle Tone: within normal limits Gait & Station: normal Patient leans: N/A  Psychiatric Specialty Exam: ROS  Blood pressure 136/82, pulse 94, height 5' 5.5" (1.664 m), weight 221 lb 6.4 oz (100.4 kg), SpO2 97 %.Body mass index is 36.28 kg/m.  General Appearance: Casual  Eye Contact:  Good  Speech:  Clear and Coherent  Volume:  Normal  Mood:  Anxious  Affect:  Congruent  Thought Process:  Goal Directed  Orientation:  Full (Time, Place, and Person)  Thought Content: Rumination   Suicidal Thoughts:  No  Homicidal Thoughts:  No  Memory:  Immediate;   Good Recent;   Good  Remote;   Good  Judgement:  Good  Insight:  Present  Psychomotor Activity:  Normal  Concentration:  Concentration: Good and Attention Span: Good  Recall:  Good  Fund of Knowledge: Good  Language: Good  Akathisia:  No  Handed:  Right  AIMS (if indicated): not done  Assets:  Communication Skills Desire for Improvement Housing Social Support  ADL's:  Intact  Cognition: WNL  Sleep:  Good    Screenings: PHQ2-9     Office Visit from 03/29/2017 in [place] Office Visit from 02/08/2017 in [place] Office Visit from 07/04/2016 in [place] from 02/16/2016 in [place] Office Visit from 12/24/2015 in [place]  PHQ-2 Total Score  0  0  1  6  0  PHQ-9 Total Score  -  -  1  23  -       Assessment and Plan: Bipolar disorder type I.  Anxiety disorder NOS.  Patient doing better since taking her medication as prescribed.  Discussed medication side effects and benefits.  Continue Ativan 1 mg twice a day, Wellbutrin XL 300 mg daily, trazodone 100 mg at bedtime and Zyprexa 10 mg at bedtime.  I also reviewed blood work results.  Discussed medication side effects and benefits.  Recommended to call us back if she has any question, concern if you feel worsening of the symptoms.  Follow-up in 3 months.   Kathlee Nations, MD 01/23/2018, 4:34 PM

## 2018-02-19 ENCOUNTER — Other Ambulatory Visit: Payer: Self-pay

## 2018-02-19 NOTE — Telephone Encounter (Signed)
We received a refill request for patients gabapentin. I have called and left her a message to call me back so I can find out how she's doing on this. She was last seen 01/23/17. Will route to Dr Carlean Purl to see if okay to refill or if he wants her to come back in first.

## 2018-02-19 NOTE — Telephone Encounter (Signed)
I spoke with Sandra Miller and she said she is only taking her gabapentin once daily instead of TID because Dr [name] Schilder thought that dose would be to high with her other medicines. She is working 7 days a week so if we need to see her she will have to book way out and ask off. She said she had already been on the gabapentin for restless legs when Dr Carlean Purl increased the dose for her functional dyspepsia.

## 2018-02-21 NOTE — Telephone Encounter (Signed)
If it is helping her (assume it is) ok to refill and she can schedule a f/u visit for when she can - OK to refill so she can have enough to make appointment in September - refill at the dose/sig she is taking now

## 2018-02-22 MED ORDER — GABAPENTIN 300 MG PO CAPS: 300.0000 mg | ORAL_CAPSULE | Freq: Every day | ORAL | 0 refills | Status: DC

## 2018-02-22 NOTE — Telephone Encounter (Signed)
I called and left her a detailed message with Dr Celesta Aver message and sent in the gabapentin at her current dose.

## 2018-03-19 ENCOUNTER — Other Ambulatory Visit: Payer: Self-pay

## 2018-03-19 MED ORDER — OMEPRAZOLE 40 MG PO CPDR: 40.0000 mg | DELAYED_RELEASE_CAPSULE | Freq: Two times a day (BID) | ORAL | 3 refills | Status: DC

## 2018-03-19 NOTE — Telephone Encounter (Signed)
Omeprazole refilled, patient up to date on her visits.

## 2018-04-06 ENCOUNTER — Telehealth: Payer: Self-pay | Admitting: Family Medicine

## 2018-04-09 ENCOUNTER — Ambulatory Visit: Payer: Medicare Other | Admitting: Family Medicine

## 2018-04-23 ENCOUNTER — Other Ambulatory Visit (HOSPITAL_COMMUNITY): Payer: Self-pay | Admitting: Psychiatry

## 2018-04-23 DIAGNOSIS — F411 Generalized anxiety disorder: Secondary | ICD-10-CM

## 2018-04-23 DIAGNOSIS — F319 Bipolar disorder, unspecified: Secondary | ICD-10-CM

## 2018-04-24 ENCOUNTER — Ambulatory Visit (HOSPITAL_COMMUNITY): Payer: Self-pay | Admitting: Psychiatry

## 2018-04-26 ENCOUNTER — Other Ambulatory Visit (HOSPITAL_COMMUNITY): Payer: Self-pay | Admitting: Psychiatry

## 2018-04-26 DIAGNOSIS — F3162 Bipolar disorder, current episode mixed, moderate: Secondary | ICD-10-CM

## 2018-04-26 DIAGNOSIS — F411 Generalized anxiety disorder: Secondary | ICD-10-CM

## 2018-04-27 ENCOUNTER — Other Ambulatory Visit (HOSPITAL_COMMUNITY): Payer: Self-pay

## 2018-04-27 DIAGNOSIS — F319 Bipolar disorder, unspecified: Secondary | ICD-10-CM

## 2018-04-27 DIAGNOSIS — F411 Generalized anxiety disorder: Secondary | ICD-10-CM

## 2018-04-27 MED ORDER — LORAZEPAM 1 MG PO TABS: 1.0000 mg | ORAL_TABLET | Freq: Two times a day (BID) | ORAL | 2 refills | Status: DC

## 2018-04-27 MED ORDER — BUPROPION HCL ER (XL) 300 MG PO TB24: 300.0000 mg | ORAL_TABLET | ORAL | 0 refills | Status: DC

## 2018-04-27 MED ORDER — TRAZODONE HCL 100 MG PO TABS: 100.0000 mg | ORAL_TABLET | Freq: Every day | ORAL | 0 refills | Status: DC

## 2018-04-27 MED ORDER — OLANZAPINE 10 MG PO TABS: 10.0000 mg | ORAL_TABLET | Freq: Every day | ORAL | 0 refills | Status: DC

## 2018-05-17 ENCOUNTER — Other Ambulatory Visit: Payer: Self-pay

## 2018-05-17 NOTE — Telephone Encounter (Signed)
May I refill her gabapentin Sir? Seen 01/2017.

## 2018-05-18 MED ORDER — GABAPENTIN 300 MG PO CAPS: 300.0000 mg | ORAL_CAPSULE | Freq: Every day | ORAL | 0 refills | Status: DC

## 2018-05-18 NOTE — Telephone Encounter (Signed)
1 more (90 day refill) She needs to see me so please message by My Chart or call her and tell her to get an appointment - guess she will need to remind herself to call us when we get Nov schedule - see if you can give her a decent estimate

## 2018-06-10 ENCOUNTER — Other Ambulatory Visit (HOSPITAL_COMMUNITY): Payer: Self-pay | Admitting: Psychiatry

## 2018-06-10 DIAGNOSIS — F319 Bipolar disorder, unspecified: Secondary | ICD-10-CM

## 2018-06-12 ENCOUNTER — Ambulatory Visit (HOSPITAL_COMMUNITY): Payer: Self-pay | Admitting: Psychiatry

## 2018-06-14 ENCOUNTER — Other Ambulatory Visit: Payer: Self-pay

## 2018-06-14 ENCOUNTER — Emergency Department (HOSPITAL_COMMUNITY)
Admission: EM | Admit: 2018-06-14 | Discharge: 2018-06-14 | Disposition: A | Discharge status: Home / Self Care | Payer: 59 | Attending: Emergency Medicine | Admitting: Emergency Medicine

## 2018-06-14 ENCOUNTER — Encounter (HOSPITAL_COMMUNITY): Payer: Self-pay

## 2018-06-14 DIAGNOSIS — Z79899 Other long term (current) drug therapy: Secondary | ICD-10-CM | POA: Insufficient documentation

## 2018-06-14 DIAGNOSIS — R51 Headache: Secondary | ICD-10-CM | POA: Diagnosis present

## 2018-06-14 DIAGNOSIS — F17210 Nicotine dependence, cigarettes, uncomplicated: Secondary | ICD-10-CM | POA: Diagnosis not present

## 2018-06-14 DIAGNOSIS — I10 Essential (primary) hypertension: Secondary | ICD-10-CM | POA: Insufficient documentation

## 2018-06-14 MED ORDER — LISINOPRIL-HYDROCHLOROTHIAZIDE 20-12.5 MG PO TABS: 1.0000 | ORAL_TABLET | Freq: Every day | ORAL | 0 refills | Status: DC

## 2018-06-14 NOTE — ED Triage Notes (Signed)
Patient c/o constant migraine x 2 weeks. Patient also c/o increased BP during this time.

## 2018-06-14 NOTE — ED Provider Notes (Signed)
[facility] DEPT Provider Note   CSN: [id number] Arrival date & time: 06/14/18  1537     History   Chief Complaint Chief Complaint  Patient presents with  . Migraine  . Hypertension    HPI Sandra Miller is a 42 y.o. female.  42 year old female with prior medical history as detailed below presents for evaluation of her elevated blood pressure.  Patient reports a prior history of elevated blood pressures.  She has been off anti-hypertensives for quite some time.  She reports that over the last several weeks she has noticed increased blood pressures.  She attributes this to recent weight gain, increased stress and anxiety, and having to work 3 jobs.  Patient is concerned that her blood pressure is high and that she is not taking anything currently for it.  She has made a appointment for a regular provider to evaluate her on November 4.  She is concerned that this is too far in the future for her to be safe.  She requests a prescription for anti-hypertensives.  She had been on lisinopril and hydrochlorothiazide in the past.  She requests same.  She denies current chest pain, shortness of breath, nausea, vomiting, visual changes, headache, or other acute complaint.  The history is provided by the patient and medical records.  Hypertension  This is a recurrent problem. The current episode started more than 1 week ago. The problem occurs constantly. The problem has not changed since onset.Pertinent negatives include no chest pain, no abdominal pain, no headaches and no shortness of breath. Nothing aggravates the symptoms. Nothing relieves the symptoms. She has tried nothing for the symptoms.    Past Medical History:  Diagnosis Date  . Bipolar 1 disorder ([place])   . GAD (generalized anxiety disorder)   . Genital HSV    currently per pt  no break out 03-22-2016   . GERD (gastroesophageal reflux disease)   . Hiatal hernia   . History of cervical dysplasia    2012 laser ablation  . History of esophageal dilatation    for dysphasia -- x2 dilated  . History of gastric ulcer   . History of Helicobacter pylori infection    remote hx  . History of hidradenitis suppurativa    "gets all over body intermittantly"    . History of hypertension    no issue since stopped drinking alcohol 2014  . History of kidney stones   . History of panic attacks   . Iron deficiency anemia   . Left ureteral stone   . OCD (obsessive compulsive disorder)   . PTSD (post-traumatic stress disorder)   . Recovering alcoholic in remission ([place])    since 2014  . RLS (restless legs syndrome)   . Smokers' cough ([place])   . Urgency of urination   . Yeast infection involving the vagina and surrounding area    secondary to taking antibiotic    Patient Active Problem List   Diagnosis Date Noted  . Viral illness 02/08/2017  . Restless leg syndrome 04/01/2016  . Nephrolithiasis 03/08/2016  . Dysfunctional uterine bleeding 03/08/2016  . Obstructive pyelonephritis 03/08/2016  . Hypokalemia 03/08/2016  . Normocytic anemia 03/08/2016  . UTI (lower urinary tract infection)   . Healthcare maintenance 12/24/2015  . Hidradenitis suppurativa of left axilla 11/03/2015  . Loss of weight 09/08/2015  . Neck pain on left side 10/22/2014  . Genital warts 05/20/2014  . GERD (gastroesophageal reflux disease) 12/26/2012  . Tobacco abuse 08/22/2012  .  H/O: HTN (hypertension) 03/19/2009  . Herpes simplex virus (HSV) infection 02/03/2007  . DISORDER, BIPOLAR NOS 02/03/2007    Past Surgical History:  Procedure Laterality Date  . CESAREAN SECTION  1995   w/  Bilateral Tubal Ligation  . COLONOSCOPY  last one 08-09-2013  . CYSTOSCOPY W/ URETERAL STENT PLACEMENT Left 03/29/2016   Procedure: CYSTOSCOPY WITH STENT REPLACEMENT;  Surgeon: Nickie Retort, MD;  Location: [facility];  Service: Urology;  Laterality: Left;  . CYSTOSCOPY WITH RETROGRADE PYELOGRAM, URETEROSCOPY  AND STENT PLACEMENT Left 03/08/2016   Procedure: CYSTOSCOPY WITH  LEFT RETROGRADE PYELOGRAM, AND STENT PLACEMENT;  Surgeon: Nickie Retort, MD;  Location: [facility];  Service: Urology;  Laterality: Left;  . CYSTOSCOPY/RETROGRADE/URETEROSCOPY/STONE EXTRACTION WITH BASKET Left 03/29/2016   Procedure: CYSTOSCOPY/RETROGRADE/URETEROSCOPY/STONE EXTRACTION WITH BASKET;  Surgeon: Nickie Retort, MD;  Location: [facility];  Service: Urology;  Laterality: Left;  . ENDOMETRIAL ABLATION W/ NOVASURE  04-01-2010  . ESOPHAGOGASTRODUODENOSCOPY  last one 08-09-2013  . KNEE ARTHROSCOPY Left as teen  . LASER ABLATION OF THE CERVIX  2012 approx  . TRANSTHORACIC ECHOCARDIOGRAM  05-19-2006   lvsf normal, ef 55-65%, there was mild flattening of the interventricular septum during diastoli/  RV size at upper limits normal  . WISDOM TOOTH EXTRACTION  age 70 's     OB History   None      Home Medications    Prior to Admission medications   Medication Sig Start Date End Date Taking? Authorizing Provider  acetaminophen (TYLENOL) 500 MG tablet Take 1,000 mg by mouth every 6 (six) hours as needed for moderate pain or fever.   Yes [provider]  buPROPion (WELLBUTRIN XL) 300 MG 24 hr tablet Take 1 tablet (300 mg total) by mouth every morning. 04/27/18 04/27/19 Yes Eksir, Richard Miu, MD  Cyanocobalamin (VITAMIN B-12) 1000 MCG SUBL Place 1,000 mcg under the tongue 3 (three) times a week.   Yes [provider]  gabapentin (NEURONTIN) 300 MG capsule Take 1 capsule (300 mg total) by mouth daily. Patient taking differently: Take 300 mg by mouth at bedtime.  05/18/18  Yes Gatha Mayer, MD  LORazepam (ATIVAN) 1 MG tablet Take 1 tablet (1 mg total) by mouth 2 (two) times daily. 04/27/18  Yes Eksir, Richard Miu, MD  Multiple Vitamin (MULTIVITAMIN) tablet Take 1 tablet by mouth daily.   Yes [provider]  OLANZapine (ZYPREXA) 10 MG tablet Take 1 tablet (10 mg total) by mouth  at bedtime. 04/27/18 04/27/19 Yes Eksir, Richard Miu, MD  omeprazole (PRILOSEC) 40 MG capsule Take 1 capsule (40 mg total) by mouth 2 (two) times daily. 03/19/18  Yes Gatha Mayer, MD  Probiotic Product (PROBIOTIC-10) CHEW Chew 1 each by mouth daily.   Yes [provider]  traZODone (DESYREL) 100 MG tablet Take 1 tablet (100 mg total) by mouth at bedtime. 04/27/18  Yes Eksir, Richard Miu, MD  docusate sodium (COLACE) 100 MG capsule Take 1 capsule (100 mg total) by mouth 2 (two) times daily. Patient taking differently: Take 100 mg by mouth daily as needed for mild constipation.  03/10/16   Donne Hazel, MD  lisinopril-hydrochlorothiazide (ZESTORETIC) 20-12.5 MG tablet Take 1 tablet by mouth daily. 06/14/18   Valarie Merino, MD  valACYclovir (VALTREX) 500 MG tablet Take 1 tablet (500 mg total) by mouth daily. Patient taking differently: Take 500 mg by mouth daily as needed (break out).  06/02/17   Verner Mould, MD  Family History Family History  Problem Relation Age of Onset  . Heart disease Father   . Lung cancer Father   . Alcohol abuse Father   . Heart disease Mother   . Depression Mother   . Anxiety disorder Mother   . Drug abuse Brother   . Alcohol abuse Brother   . Drug abuse Brother   . ADD / ADHD Brother   . Colon cancer Brother        malignant polyp < 60 in 1  . Stomach cancer Paternal Grandfather   . Diabetes Maternal Grandfather   . Diabetes Paternal Grandmother   . Kidney disease Maternal Uncle   . Cirrhosis Cousin        alcoholic  . Anxiety disorder Maternal Aunt   . Depression Maternal Aunt     Social History Social History   Tobacco Use  . Smoking status: Current Every Day Smoker    Packs/day: 1.00    Years: 22.00    Pack years: 22.00    Types: Cigarettes  . Smokeless tobacco: Never Used  . Tobacco comment: Recently started a smoking cessation class.   Substance Use Topics  . Alcohol use: Not Currently    Alcohol/week: 0.0  standard drinks    Comment:  hx alcohollism  in remission since 2014  . Drug use: No    Comment: hx  marijuana use     Allergies   Depakote [divalproex sodium]; Minocycline; and Aspirin   Review of Systems Review of Systems  Respiratory: Negative for shortness of breath.   Cardiovascular: Negative for chest pain.  Gastrointestinal: Negative for abdominal pain.  Neurological: Negative for headaches.  All other systems reviewed and are negative.    Physical Exam Updated Vital Signs BP (!) 178/116 (BP Location: Left Arm)   Pulse 99   Temp 98.4 F (36.9 C) (Oral)   Resp 16   Ht 5' 5.5" (1.664 m)   Wt 99.8 kg   SpO2 100%   BMI 36.05 kg/m   Physical Exam  Constitutional: She is oriented to person, place, and time. She appears well-developed and well-nourished. No distress.  HENT:  Head: Normocephalic and atraumatic.  Mouth/Throat: Oropharynx is clear and moist.  Eyes: Pupils are equal, round, and reactive to light. Conjunctivae and EOM are normal.  Neck: Normal range of motion. Neck supple.  Cardiovascular: Normal rate, regular rhythm and normal heart sounds.  Pulmonary/Chest: Effort normal and breath sounds normal. No respiratory distress.  Abdominal: Soft. She exhibits no distension. There is no tenderness.  Musculoskeletal: Normal range of motion. She exhibits no edema or deformity.  Neurological: She is alert and oriented to person, place, and time.  Skin: Skin is warm and dry.  Psychiatric: She has a normal mood and affect.  Nursing note and vitals reviewed.    ED Treatments / Results  Labs (all labs ordered are listed, but only abnormal results are displayed) Labs Reviewed - No data to display  EKG None  Radiology No results found.  Procedures Procedures (including critical care time)  Medications Ordered in ED Medications - No data to display   Initial Impression / Assessment and Plan / ED Course  I have reviewed the triage vital signs and the  nursing notes.  Pertinent labs & imaging results that were available during my care of the patient were reviewed by me and considered in my medical decision making (see chart for details).     MDM  Screen complete  Patient is presenting  for evaluation of her hypertension.  Patient without evidence of endorgan damage.  The outpatient management of hypertension was extensively discussed with the patient.  Although she does not necessarily require immediate anti-hypertensives I will provide her with a prescription for a combination of lisinopril and hydrochlorthiazide per her request.  She has already established follow-up for November 4.  She understands need for keeping that appointment.  Strict return precautions are given and understood.  All patient's questions answered prior to discharge.   Final Clinical Impressions(s) / ED Diagnoses   Final diagnoses:  Hypertension, unspecified type    ED Discharge Orders         Ordered    lisinopril-hydrochlorothiazide (ZESTORETIC) 20-12.5 MG tablet  Daily     06/14/18 1915           Valarie Merino, MD 06/14/18 1924

## 2018-06-14 NOTE — Discharge Instructions (Signed)
Please return for any problem.  Follow-up with your regular doctor on November 4 as already scheduled.  Please keep a blood pressure diary as instructed.

## 2018-06-23 ENCOUNTER — Encounter: Payer: Medicare Other | Admitting: Osteopathic Medicine

## 2018-06-28 NOTE — Telephone Encounter (Signed)
done

## 2018-07-06 ENCOUNTER — Ambulatory Visit (HOSPITAL_COMMUNITY): Payer: 59 | Admitting: Psychiatry

## 2018-07-06 ENCOUNTER — Ambulatory Visit: Payer: Medicare Other | Admitting: Family Medicine

## 2018-07-09 ENCOUNTER — Encounter: Payer: Self-pay | Admitting: Family Medicine

## 2018-07-09 ENCOUNTER — Other Ambulatory Visit: Payer: Self-pay

## 2018-07-09 ENCOUNTER — Ambulatory Visit (INDEPENDENT_AMBULATORY_CARE_PROVIDER_SITE_OTHER): Payer: 59 | Admitting: Family Medicine

## 2018-07-09 VITALS — BP 130/80 | HR 95 | Temp 98.4°F | Ht 65.0 in | Wt 212.6 lb

## 2018-07-09 DIAGNOSIS — I10 Essential (primary) hypertension: Secondary | ICD-10-CM | POA: Diagnosis not present

## 2018-07-09 DIAGNOSIS — F17200 Nicotine dependence, unspecified, uncomplicated: Secondary | ICD-10-CM

## 2018-07-09 DIAGNOSIS — N898 Other specified noninflammatory disorders of vagina: Secondary | ICD-10-CM

## 2018-07-09 DIAGNOSIS — E559 Vitamin D deficiency, unspecified: Secondary | ICD-10-CM

## 2018-07-09 DIAGNOSIS — L723 Sebaceous cyst: Secondary | ICD-10-CM

## 2018-07-09 MED ORDER — LISINOPRIL-HYDROCHLOROTHIAZIDE 20-12.5 MG PO TABS: 1.0000 | ORAL_TABLET | Freq: Every day | ORAL | 1 refills | Status: DC

## 2018-07-09 NOTE — Patient Instructions (Addendum)
If you have lab work done today you will be contacted with your lab results within the next 2 weeks.  If you have not heard from [place] then please contact us. The fastest way to get your results is to register for My Chart.   IF you received an x-ray today, you will receive an invoice from [facility]. Please contact [facility] at [phone] with questions or concerns regarding your invoice.   IF you received labwork today, you will receive an invoice from [facility]. Please contact [facility] at [phone] with questions or concerns regarding your invoice.   Our billing staff will not be able to assist you with questions regarding bills from these companies.  You will be contacted with the lab results as soon as they are available. The fastest way to get your results is to activate your My Chart account. Instructions are located on the last page of this paperwork. If you have not heard from [place] regarding the results in 2 weeks, please contact this office.      Coping with Quitting Smoking Quitting smoking is a physical and mental challenge. You will face cravings, withdrawal symptoms, and temptation. Before quitting, work with your health care provider to make a plan that can help you cope. Preparation can help you quit and keep you from giving in. How can I cope with cravings? Cravings usually last for 5-10 minutes. If you get through it, the craving will pass. Consider taking the following actions to help you cope with cravings:  Keep your mouth busy: ? Chew sugar-free gum. ? Suck on hard candies or a straw. ? Brush your teeth.  Keep your hands and body busy: ? Immediately change to a different activity when you feel a craving. ? Squeeze or play with a ball. ? Do an activity or a hobby, like making bead jewelry, practicing needlepoint, or working with wood. ? Mix up your normal routine. ? Take a short exercise break. Go for a quick walk or run up and down  stairs. ? Spend time in public places where smoking is not allowed.  Focus on doing something kind or helpful for someone else.  Call a friend or family member to talk during a craving.  Join a support group.  Call a quit line, such as 1-800-QUIT-NOW.  Talk with your health care provider about medicines that might help you cope with cravings and make quitting easier for you.  How can I deal with withdrawal symptoms? Your body may experience negative effects as it tries to get used to not having nicotine in the system. These effects are called withdrawal symptoms. They may include:  Feeling hungrier than normal.  Trouble concentrating.  Irritability.  Trouble sleeping.  Feeling depressed.  Restlessness and agitation.  Craving a cigarette.  To manage withdrawal symptoms:  Avoid places, people, and activities that trigger your cravings.  Remember why you want to quit.  Get plenty of sleep.  Avoid coffee and other caffeinated drinks. These may worsen some of your symptoms.  How can I handle social situations? Social situations can be difficult when you are quitting smoking, especially in the first few weeks. To manage this, you can:  Avoid parties, bars, and other social situations where people might be smoking.  Avoid alcohol.  Leave right away if you have the urge to smoke.  Explain to your family and friends that you are quitting smoking. Ask for understanding and support.  Plan activities with friends or family  where smoking is not an option.  What are some ways I can cope with stress? Wanting to smoke may cause stress, and stress can make you want to smoke. Find ways to manage your stress. Relaxation techniques can help. For example:  Breathe slowly and deeply, in through your nose and out through your mouth.  Listen to soothing, relaxing music.  Talk with a family member or friend about your stress.  Light a candle.  Soak in a bath or take a  shower.  Think about a peaceful place.  What are some ways I can prevent weight gain? Be aware that many people gain weight after they quit smoking. However, not everyone does. To keep from gaining weight, have a plan in place before you quit and stick to the plan after you quit. Your plan should include:  Having healthy snacks. When you have a craving, it may help to: ? Eat plain popcorn, crunchy carrots, celery, or other cut vegetables. ? Chew sugar-free gum.  Changing how you eat: ? Eat small portion sizes at meals. ? Eat 4-6 small meals throughout the day instead of 1-2 large meals a day. ? Be mindful when you eat. Do not watch television or do other things that might distract you as you eat.  Exercising regularly: ? Make time to exercise each day. If you do not have time for a long workout, do short bouts of exercise for 5-10 minutes several times a day. ? Do some form of strengthening exercise, like weight lifting, and some form of aerobic exercise, like running or swimming.  Drinking plenty of water or other low-calorie or no-calorie drinks. Drink 6-8 glasses of water daily, or as much as instructed by your health care provider.  Summary  Quitting smoking is a physical and mental challenge. You will face cravings, withdrawal symptoms, and temptation to smoke again. Preparation can help you as you go through these challenges.  You can cope with cravings by keeping your mouth busy (such as by chewing gum), keeping your body and hands busy, and making calls to family, friends, or a helpline for people who want to quit smoking.  You can cope with withdrawal symptoms by avoiding places where people smoke, avoiding drinks with caffeine, and getting plenty of rest.  Ask your health care provider about the different ways to prevent weight gain, avoid stress, and handle social situations. This information is not intended to replace advice given to you by your health care provider. Make  sure you discuss any questions you have with your health care provider. Document Released: 08/19/2016 Document Revised: 08/19/2016 Document Reviewed: 08/19/2016 Elsevier Interactive Patient Education  [employer].

## 2018-07-09 NOTE — Progress Notes (Signed)
11/4/20199:16 AM  Sandra Miller April 16, 1976, 42 y.o. female [id number]  Chief Complaint  Patient presents with  . New Patient (Initial Visit)    was seen at ER on 06/14/18 dx with Hypertension    HPI:   Patient is a 42 y.o. female with past medical history significant for bipolar disorder, GAD, HTN who presents today for ER followup  Seen in ER 06/14/18 for elevated BP She has been off meds for years once she stopped drinking etoh Now has 3 jobs which is very stressful Started back on lisionpril-hctz  Psych - Dr Berniece Andreas  Has not seen PCP for years Has h/o abnormal paps, has h/o LEEP and cryo Has a vaginal growth, neg biospy, but feels it is getting bigger, way more uncomfortable Also has h/o endometrial ablation, amenorrhea since then  Also would like referral for removal of cyst near eye  Reports h/o vitamin d deficiency  Smokes a PPD, just received a kit of nicotine patches Has smoking coach Coughing and SOB when she goes up stairs in her appt building  Got flu vaccine this season at [place]  07/09/2018 07/09/2018 03/29/2017 11/03/2015 09/08/2015  Falls in the past year? 0 0 No No No     Depression screen [facility] 2/9 07/09/2018 07/09/2018 03/29/2017  Decreased Interest 0 0 0  Down, Depressed, Hopeless 1 0 0  PHQ - 2 Score 1 0 0  Altered sleeping - - -  Tired, decreased energy - - -  Change in appetite - - -  Feeling bad or failure about yourself  - - -  Trouble concentrating - - -  Moving slowly or fidgety/restless - - -  Suicidal thoughts - - -  PHQ-9 Score - - -  Some encounter information is confidential and restricted. Go to Review Flowsheets activity to see all data.  Some recent data might be hidden    Allergies  Allergen Reactions  . Depakote [Divalproex Sodium] Nausea And Vomiting  . Minocycline Hives  . Aspirin Hives    States able to tolerate Goody Powders without any problem except for GI upset    Prior to Admission medications     Medication Sig Start Date End Date Taking? Authorizing Provider  acetaminophen (TYLENOL) 500 MG tablet Take 1,000 mg by mouth every 6 (six) hours as needed for moderate pain or fever.   Yes [provider]  buPROPion (WELLBUTRIN XL) 300 MG 24 hr tablet Take 1 tablet (300 mg total) by mouth every morning. 04/27/18 04/27/19 Yes Eksir, Richard Miu, MD  Cyanocobalamin (VITAMIN B-12) 1000 MCG SUBL Place 1,000 mcg under the tongue 3 (three) times a week.   Yes [provider]  docusate sodium (COLACE) 100 MG capsule Take 1 capsule (100 mg total) by mouth 2 (two) times daily. Patient taking differently: Take 100 mg by mouth daily as needed for mild constipation.  03/10/16  Yes Donne Hazel, MD  gabapentin (NEURONTIN) 300 MG capsule Take 1 capsule (300 mg total) by mouth daily. Patient taking differently: Take 300 mg by mouth at bedtime.  05/18/18  Yes Gatha Mayer, MD  lisinopril-hydrochlorothiazide (ZESTORETIC) 20-12.5 MG tablet Take 1 tablet by mouth daily. 06/14/18  Yes Valarie Merino, MD  LORazepam (ATIVAN) 1 MG tablet Take 1 tablet (1 mg total) by mouth 2 (two) times daily. 04/27/18  Yes Eksir, Richard Miu, MD  Multiple Vitamin (MULTIVITAMIN) tablet Take 1 tablet by mouth daily.   Yes [provider]  OLANZapine (  ZYPREXA) 10 MG tablet Take 1 tablet (10 mg total) by mouth at bedtime. 04/27/18 04/27/19 Yes Eksir, Richard Miu, MD  omeprazole (PRILOSEC) 40 MG capsule Take 1 capsule (40 mg total) by mouth 2 (two) times daily. 03/19/18  Yes Gatha Mayer, MD  Probiotic Product (PROBIOTIC-10) CHEW Chew 1 each by mouth daily.   Yes [provider]  traZODone (DESYREL) 100 MG tablet Take 1 tablet (100 mg total) by mouth at bedtime. 04/27/18  Yes Eksir, Richard Miu, MD  valACYclovir (VALTREX) 500 MG tablet Take 1 tablet (500 mg total) by mouth daily. Patient taking differently: Take 500 mg by mouth daily as needed (break out).  06/02/17  Yes Verner Mould, MD    Past Medical History:  Diagnosis Date  . Bipolar 1 disorder ([place])   . GAD (generalized anxiety disorder)   . Genital HSV    currently per pt  no break out 03-22-2016   . GERD (gastroesophageal reflux disease)   . Hiatal hernia   . History of cervical dysplasia    2012 laser ablation  . History of esophageal dilatation    for dysphasia -- x2 dilated  . History of gastric ulcer   . History of Helicobacter pylori infection    remote hx  . History of hidradenitis suppurativa    "gets all over body intermittantly"    . History of hypertension    no issue since stopped drinking alcohol 2014  . History of kidney stones   . History of panic attacks   . Iron deficiency anemia   . Left ureteral stone   . OCD (obsessive compulsive disorder)   . PTSD (post-traumatic stress disorder)   . Recovering alcoholic in remission ([place])    since 2014  . RLS (restless legs syndrome)   . Smokers' cough ([place])   . Urgency of urination   . Yeast infection involving the vagina and surrounding area    secondary to taking antibiotic    Past Surgical History:  Procedure Laterality Date  . CESAREAN SECTION  1995   w/  Bilateral Tubal Ligation  . COLONOSCOPY  last one 08-09-2013  . CYSTOSCOPY W/ URETERAL STENT PLACEMENT Left 03/29/2016   Procedure: CYSTOSCOPY WITH STENT REPLACEMENT;  Surgeon: Nickie Retort, MD;  Location[facility];  Service: Urology;  Laterality: Left;  . CYSTOSCOPY WITH RETROGRADE PYELOGRAM, URETEROSCOPY AND STENT PLACEMENT Left 03/08/2016   Procedure: CYSTOSCOPY WITH  LEFT RETROGRADE PYELOGRAM, AND STENT PLACEMENT;  Surgeon: Nickie Retort, MD;  Location: [facility];  Service: Urology;  Laterality: Left;  . CYSTOSCOPY/RETROGRADE/URETEROSCOPY/STONE EXTRACTION WITH BASKET Left 03/29/2016   Procedure: CYSTOSCOPY/RETROGRADE/URETEROSCOPY/STONE EXTRACTION WITH BASKET;  Surgeon: Nickie Retort, MD;  Location[facility];  Service: Urology;   Laterality: Left;  . ENDOMETRIAL ABLATION W/ NOVASURE  04-01-2010  . ESOPHAGOGASTRODUODENOSCOPY  last one 08-09-2013  . KNEE ARTHROSCOPY Left as teen  . LASER ABLATION OF THE CERVIX  2012 approx  . TRANSTHORACIC ECHOCARDIOGRAM  05-19-2006   lvsf normal, ef 55-65%, there was mild flattening of the interventricular septum during diastoli/  RV size at upper limits normal  . WISDOM TOOTH EXTRACTION  age 38 's    Social History   Tobacco Use  . Smoking status: Current Every Day Smoker    Packs/day: 1.00    Years: 22.00    Pack years: 22.00    Types: Cigarettes  . Smokeless tobacco: Never Used  . Tobacco comment: Recently started a smoking cessation class.  Substance Use Topics  . Alcohol use: Not Currently    Alcohol/week: 0.0 standard drinks    Comment:  hx alcohollism  in remission since 2014    Family History  Problem Relation Age of Onset  . Heart disease Father   . Lung cancer Father   . Alcohol abuse Father   . Heart disease Mother   . Depression Mother   . Anxiety disorder Mother   . Drug abuse Brother   . Alcohol abuse Brother   . Drug abuse Brother   . ADD / ADHD Brother   . Colon cancer Brother        malignant polyp < 60 in 1  . Stomach cancer Paternal Grandfather   . Diabetes Maternal Grandfather   . Diabetes Paternal Grandmother   . Kidney disease Maternal Uncle   . Cirrhosis Cousin        alcoholic  . Anxiety disorder Maternal Aunt   . Depression Maternal Aunt     Review of Systems  Constitutional: Negative for chills and fever.  Respiratory: Positive for cough and shortness of breath.   Cardiovascular: Negative for chest pain, palpitations and leg swelling.  Gastrointestinal: Negative for abdominal pain, nausea and vomiting.     OBJECTIVE:  Blood pressure 130/80, pulse 95, temperature 98.4 F (36.9 C), temperature source Oral, height '5\' 5"'$  (1.651 m), weight 212 lb 9.6 oz (96.4 kg), SpO2 (!) 14 %. Body mass index is 35.38 kg/m.   Physical  Exam  Constitutional: She is oriented to person, place, and time. She appears well-developed and well-nourished.  HENT:  Head: Normocephalic and atraumatic.  Mouth/Throat: Oropharynx is clear and moist. No oropharyngeal exudate.  Eyes: Pupils are equal, round, and reactive to light. Conjunctivae and EOM are normal. No scleral icterus.  Neck: Neck supple.  Cardiovascular: Normal rate, regular rhythm and normal heart sounds. Exam reveals no gallop and no friction rub.  No murmur heard. Pulmonary/Chest: Effort normal and breath sounds normal. She has no wheezes. She has no rales.  Musculoskeletal: She exhibits no edema.  Neurological: She is alert and oriented to person, place, and time.  Skin: Skin is warm and dry.  Psychiatric: She has a normal mood and affect.  Nursing note and vitals reviewed.   ASSESSMENT and PLAN  1. Essential hypertension Controlled. Continue current regime.  - COMPLETE METABOLIC PANEL WITH GFR - TSH - Urinalysis, dipstick only - CBC - Lipid panel  2. Vaginal lesion - Ambulatory referral to Obstetrics / Gynecology  3. Sebaceous cyst - Ambulatory referral to Dermatology  4. Vitamin D deficiency - VITAMIN D 25 Hydroxy (Vit-D Deficiency, Fractures)  5. Tobacco use disorder Smoking cessation instruction/counseling given for 3-10 minutes:  counseled patient on the dangers of tobacco use, advised patient to stop smoking, and reviewed strategies to maximize success  Other orders - lisinopril-hydrochlorothiazide (ZESTORETIC) 20-12.5 MG tablet; Take 1 tablet by mouth daily.   Return in about 3 months (around 10/09/2018).    Rutherford Guys, MD [facility] [place], [place] [address] Ph.  [phone] Fax [phone]

## 2018-07-09 NOTE — Addendum Note (Signed)
Addended by: Rutherford Guys on: 07/09/2018 09:55 AM   Modules accepted: Orders

## 2018-07-10 LAB — URINALYSIS, DIPSTICK ONLY
Bilirubin, UA: NEGATIVE
Glucose, UA: NEGATIVE
Ketones, UA: NEGATIVE
Leukocytes, UA: NEGATIVE
Nitrite, UA: NEGATIVE
Protein, UA: NEGATIVE
RBC, UA: NEGATIVE
Specific Gravity, UA: 1.022 (ref 1.005–1.030)
Urobilinogen, Ur: 0.2 mg/dL (ref 0.2–1.0)
pH, UA: 6.5 (ref 5.0–7.5)

## 2018-07-10 LAB — TSH: TSH: 0.503 u[IU]/mL (ref 0.450–4.500)

## 2018-07-10 LAB — CBC
Hematocrit: 37.2 % (ref 34.0–46.6)
Hemoglobin: 12.9 g/dL (ref 11.1–15.9)
MCH: 29.2 pg (ref 26.6–33.0)
MCHC: 34.7 g/dL (ref 31.5–35.7)
MCV: 84 fL (ref 79–97)
Platelets: 356 10*3/uL (ref 150–450)
RBC: 4.42 x10E6/uL (ref 3.77–5.28)
RDW: 13.7 % (ref 12.3–15.4)
WBC: 9.4 10*3/uL (ref 3.4–10.8)

## 2018-07-10 LAB — LIPID PANEL
Chol/HDL Ratio: 4.8 ratio — ABNORMAL HIGH (ref 0.0–4.4)
Cholesterol, Total: 198 mg/dL (ref 100–199)
HDL: 41 mg/dL (ref >39)
LDL Calculated: 134 mg/dL — ABNORMAL HIGH (ref 0–99)
Triglycerides: 116 mg/dL (ref 0–149)
VLDL Cholesterol Cal: 23 mg/dL (ref 5–40)

## 2018-07-10 LAB — VITAMIN D 25 HYDROXY (VIT D DEFICIENCY, FRACTURES): Vit D, 25-Hydroxy: 28.8 ng/mL — ABNORMAL LOW (ref 30.0–100.0)

## 2018-07-19 ENCOUNTER — Ambulatory Visit (INDEPENDENT_AMBULATORY_CARE_PROVIDER_SITE_OTHER): Payer: 59 | Admitting: Psychiatry

## 2018-07-19 ENCOUNTER — Encounter (HOSPITAL_COMMUNITY): Payer: Self-pay | Admitting: Psychiatry

## 2018-07-19 ENCOUNTER — Telehealth: Payer: Self-pay | Admitting: Family Medicine

## 2018-07-19 VITALS — BP 125/83 | HR 92 | Ht 65.0 in | Wt 213.0 lb

## 2018-07-19 DIAGNOSIS — F319 Bipolar disorder, unspecified: Secondary | ICD-10-CM

## 2018-07-19 DIAGNOSIS — F411 Generalized anxiety disorder: Secondary | ICD-10-CM | POA: Diagnosis not present

## 2018-07-19 MED ORDER — BUPROPION HCL ER (XL) 300 MG PO TB24: 300.0000 mg | ORAL_TABLET | ORAL | 0 refills | Status: DC

## 2018-07-19 MED ORDER — TRAZODONE HCL 100 MG PO TABS: 100.0000 mg | ORAL_TABLET | Freq: Every day | ORAL | 0 refills | Status: DC

## 2018-07-19 MED ORDER — LORAZEPAM 1 MG PO TABS: 1.0000 mg | ORAL_TABLET | Freq: Two times a day (BID) | ORAL | 2 refills | Status: DC

## 2018-07-19 MED ORDER — OLANZAPINE 10 MG PO TABS: 10.0000 mg | ORAL_TABLET | Freq: Every day | ORAL | 0 refills | Status: DC

## 2018-07-19 NOTE — Telephone Encounter (Signed)
Copied from [place] [id number]. Topic: General - Other >> Jul 19, 2018  4:56 PM Keene Breath wrote: Reason for CRM: Patient called to inform the doctor that after seeing her psychiatrist, he noticed that her cholesterol was high and thought that she should ask her PCP to review her labs and possibly put her on some kind of cholesterol medication.  Please advise.  CB# [phone]

## 2018-07-19 NOTE — Telephone Encounter (Signed)
pls advise per CRM: Patient called to inform the doctor that after seeing her psychiatrist, he noticed that her cholesterol was high and thought that she should ask her PCP to review her labs and possibly put her on some kind of cholesterol medication.  Please advise.  CB# [phone]

## 2018-07-19 NOTE — Progress Notes (Signed)
BH MD/PA/NP OP Progress Note  07/19/2018 2:51 PM Sandra Miller  MRN:  [id number]  Chief Complaint: I am under a lot of stress.  Now I am taking blood pressure medication.  HPI: Sandra Miller came for her follow-up appointment.  She recently seen at urgent care because of high blood pressure.  She is taking lisinopril which is helping her blood pressure.  She also had a blood work which shows low vitamin D and high LDL.  She has not started any cholesterol lowering medication.  She admitted increased anxiety and is stressed because she is working 3 jobs.  She admitted when she was not working she was having boredom and feeling isolated but now she is working 3 jobs and that causing her increased stress.  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  She also worried about her son who lost his job and then lost his apartment and moved to [place].  Recently she find out that he is bisexual and that bothers him but she has not discussed her concerns to her son as she does not want make him sad.  She is taking her medication as prescribed.  She is not drinking or using any drugs.  She lives by herself.  She denies any paranoia, hallucination, suicidal thoughts or any homicidal thought.  She describes her mood is mostly anxious but denies any severe agitation or any mania.  She is relieved that she lost weight because she cut down her calorie intake.  Patient has no tremors, shakes or any EPS.  Visit Diagnosis:    ICD-10-CM   1. GAD (generalized anxiety disorder) F41.1 LORazepam (ATIVAN) 1 MG tablet  2. Bipolar 1 disorder (HCC) F31.9 OLANZapine (ZYPREXA) 10 MG tablet    traZODone (DESYREL) 100 MG tablet    buPROPion (WELLBUTRIN XL) 300 MG 24 hr tablet    Past Psychiatric History: Reviewed. Patient has history of bipolar disorder, alcohol dependence and marijuana abuse. She has at least 6  psychiatric hospitalization. In the past she had tried lithium, Seroquel, Lamictal, Paxil, Celexa, Abilify, Geodon, Depakote, Neurontin and Cymbalta. Patient denies any history of suicidal attempt.   Past Medical History:  Past Medical History:  Diagnosis Date  . Bipolar 1 disorder ([place])   . GAD (generalized anxiety disorder)   . Genital HSV    currently per pt  no break out 03-22-2016   . GERD (gastroesophageal reflux disease)   . Hiatal hernia   . History of cervical dysplasia    2012 laser ablation  . History of esophageal dilatation    for dysphasia -- x2 dilated  . History of gastric ulcer   . History of Helicobacter pylori infection    remote hx  . History of hidradenitis suppurativa    "gets all over body intermittantly"    . History of hypertension    no issue since stopped drinking alcohol 2014  . History of kidney stones   . History of panic attacks   . Iron deficiency anemia   . Left ureteral stone   . OCD (obsessive compulsive disorder)   . PTSD (post-traumatic stress disorder)   . Recovering alcoholic in remission ([place])    since 2014  . RLS (restless legs syndrome)   . Smokers' cough ([place])   . Urgency of urination   . Yeast infection involving the vagina and surrounding area  secondary to taking antibiotic    Past Surgical History:  Procedure Laterality Date  . CESAREAN SECTION  1995   w/  Bilateral Tubal Ligation  . COLONOSCOPY  last one 08-09-2013  . CYSTOSCOPY W/ URETERAL STENT PLACEMENT Left 03/29/2016   Procedure: CYSTOSCOPY WITH STENT REPLACEMENT;  Surgeon: Nickie Retort, MD;  Location[facility];  Service: Urology;  Laterality: Left;  . CYSTOSCOPY WITH RETROGRADE PYELOGRAM, URETEROSCOPY AND STENT PLACEMENT Left 03/08/2016   Procedure: CYSTOSCOPY WITH  LEFT RETROGRADE PYELOGRAM, AND STENT PLACEMENT;  Surgeon: Nickie Retort, MD;  Location: [facility];  Service: Urology;  Laterality: Left;  .  CYSTOSCOPY/RETROGRADE/URETEROSCOPY/STONE EXTRACTION WITH BASKET Left 03/29/2016   Procedure: CYSTOSCOPY/RETROGRADE/URETEROSCOPY/STONE EXTRACTION WITH BASKET;  Surgeon: Nickie Retort, MD;  Location: [facility];  Service: Urology;  Laterality: Left;  . ENDOMETRIAL ABLATION W/ NOVASURE  04-01-2010  . ESOPHAGOGASTRODUODENOSCOPY  last one 08-09-2013  . KNEE ARTHROSCOPY Left as teen  . LASER ABLATION OF THE CERVIX  2012 approx  . TRANSTHORACIC ECHOCARDIOGRAM  05-19-2006   lvsf normal, ef 55-65%, there was mild flattening of the interventricular septum during diastoli/  RV size at upper limits normal  . WISDOM TOOTH EXTRACTION  age 31 's    Family Psychiatric History: Reviewed.  Family History:  Family History  Problem Relation Age of Onset  . Heart disease Father   . Lung cancer Father   . Alcohol abuse Father   . Heart disease Mother   . Depression Mother   . Anxiety disorder Mother   . Drug abuse Brother   . Alcohol abuse Brother   . Drug abuse Brother   . ADD / ADHD Brother   . Colon cancer Brother        malignant polyp < 60 in 1  . Stomach cancer Paternal Grandfather   . Diabetes Maternal Grandfather   . Diabetes Paternal Grandmother   . Kidney disease Maternal Uncle   . Cirrhosis Cousin        alcoholic  . Anxiety disorder Maternal Aunt   . Depression Maternal Aunt     Social History:  Social History   Socioeconomic History  . Marital status: Single    Spouse name: Not on file  . Number of children: Not on file  . Years of education: Not on file  . Highest education level: Not on file  Occupational History  . Not on file  Social Needs  . Financial resource strain: Not on file  . Food insecurity:    Worry: Not on file    Inability: Not on file  . Transportation needs:    Medical: Not on file    Non-medical: Not on file  Tobacco Use  . Smoking status: Current Every Day Smoker    Packs/day: 1.00    Years: 22.00    Pack years: 22.00     Types: Cigarettes  . Smokeless tobacco: Never Used  . Tobacco comment: Recently started a smoking cessation class.   Substance and Sexual Activity  . Alcohol use: Not Currently    Alcohol/week: 0.0 standard drinks    Comment:  hx alcohollism  in remission since 2014  . Drug use: No    Comment: hx  marijuana use  . Sexual activity: Yes    Partners: Male    Birth control/protection: Surgical  Lifestyle  . Physical activity:    Days per week: Not on file    Minutes per session: Not on file  . Stress:  Not on file  Relationships  . Social connections:    Talks on phone: Not on file    Gets together: Not on file    Attends religious service: Not on file    Active member of club or organization: Not on file    Attends meetings of clubs or organizations: Not on file    Relationship status: Not on file  Other Topics Concern  . Not on file  Social History Narrative   Former [facility] patient.      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    In school at [employer] to become Psychologist, sport and exercise.     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      Last updated   01/23/2017    Allergies:  Allergies  Allergen Reactions  . Depakote [Divalproex Sodium] Nausea And Vomiting  . Minocycline Hives  . Aspirin Hives    States able to tolerate Goody Powders without any problem except for GI upset    Metabolic Disorder Labs: Recent Results (from the past 2160 hour(s))  VITAMIN D 25 Hydroxy (Vit-D Deficiency, Fractures)     Status: Abnormal   Collection Time: 07/09/18  9:58 AM  Result Value Ref Range   Vit D, 25-Hydroxy 28.8 (L) 30.0 - 100.0 ng/mL    Comment: Vitamin D deficiency has been defined by the [place] practice guideline as a level of serum 25-OH vitamin D less than 20 ng/mL (1,2). The Endocrine Society went on to further define vitamin D insufficiency as a  level between 21 and 29 ng/mL (2). 1. IOM (Institute of Medicine). 2010. Dietary reference    intakes for calcium and D. [place]: The    [employer]. 2. Holick MF, Binkley Benbrook, Bischoff-Ferrari HA, et al.    Evaluation, treatment, and prevention of vitamin D    deficiency: an Endocrine Society clinical practice    guideline. JCEM. 2011 Jul; 96(7):1911-30.   TSH     Status: None   Collection Time: 07/09/18  9:59 AM  Result Value Ref Range   TSH 0.503 0.450 - 4.500 uIU/mL  CBC     Status: None   Collection Time: 07/09/18  9:59 AM  Result Value Ref Range   WBC 9.4 3.4 - 10.8 x10E3/uL   RBC 4.42 3.77 - 5.28 x10E6/uL   Hemoglobin 12.9 11.1 - 15.9 g/dL   Hematocrit 37.2 34.0 - 46.6 %   MCV 84 79 - 97 fL   MCH 29.2 26.6 - 33.0 pg   MCHC 34.7 31.5 - 35.7 g/dL   RDW 13.7 12.3 - 15.4 %   Platelets 356 150 - 450 x10E3/uL  Lipid panel     Status: Abnormal   Collection Time: 07/09/18  9:59 AM  Result Value Ref Range   Cholesterol, Total 198 100 - 199 mg/dL   Triglycerides 116 0 - 149 mg/dL   HDL 41 >39 mg/dL   VLDL Cholesterol Cal 23 5 - 40 mg/dL   LDL Calculated 134 (H) 0 - 99 mg/dL   Chol/HDL Ratio 4.8 (H) 0.0 - 4.4 ratio    Comment:  T. Chol/HDL Ratio                                             Men  Women                               1/2 Avg.Risk  3.4    3.3                                   Avg.Risk  5.0    4.4                                2X Avg.Risk  9.6    7.1                                3X Avg.Risk 23.4   11.0   Urinalysis, dipstick only     Status: None   Collection Time: 07/09/18 10:09 AM  Result Value Ref Range   Specific Gravity, UA 1.022 1.005 - 1.030   pH, UA 6.5 5.0 - 7.5   Color, UA Yellow Yellow   Appearance Ur Clear Clear   Leukocytes, UA Negative Negative   Protein, UA Negative Negative/Trace   Glucose, UA Negative Negative   Ketones, UA Negative Negative   RBC, UA Negative Negative   Bilirubin, UA  Negative Negative   Urobilinogen, Ur 0.2 0.2 - 1.0 mg/dL   Nitrite, UA Negative Negative   Lab Results  Component Value Date   HGBA1C 5.2 11/10/2016   MPG 103 11/10/2016   No results found for: PROLACTIN Lab Results  Component Value Date   CHOL 198 07/09/2018   TRIG 116 07/09/2018   HDL 41 07/09/2018   CHOLHDL 4.8 (H) 07/09/2018   VLDL 32 06/13/2008   LDLCALC 134 (H) 07/09/2018   LDLCALC 123 (H) 06/13/2008   Lab Results  Component Value Date   TSH 0.503 07/09/2018   TSH 0.50 12/24/2015    Therapeutic Level Labs: No results found for: LITHIUM No results found for: VALPROATE No components found for:  CBMZ  Current Medications: Current Outpatient Medications  Medication Sig Dispense Refill  . acetaminophen (TYLENOL) 500 MG tablet Take 1,000 mg by mouth every 6 (six) hours as needed for moderate pain or fever.    Marland Kitchen buPROPion (WELLBUTRIN XL) 300 MG 24 hr tablet Take 1 tablet (300 mg total) by mouth every morning. 90 tablet 0  . Cyanocobalamin (VITAMIN B-12) 1000 MCG SUBL Place 1,000 mcg under the tongue 3 (three) times a week.    . docusate sodium (COLACE) 100 MG capsule Take 1 capsule (100 mg total) by mouth 2 (two) times daily. (Patient taking differently: Take 100 mg by mouth daily as needed for mild constipation. ) 10 capsule 0  . gabapentin (NEURONTIN) 300 MG capsule Take 1 capsule (300 mg total) by mouth daily. (Patient taking differently: Take 300 mg by mouth at bedtime. ) 90 capsule 0  . lisinopril-hydrochlorothiazide (ZESTORETIC) 20-12.5 MG tablet Take 1 tablet by mouth daily. 90 tablet 1  . LORazepam (ATIVAN) 1 MG tablet Take 1 tablet (1 mg total) by mouth 2 (two) times  daily. 60 tablet 2  . Multiple Vitamin (MULTIVITAMIN) tablet Take 1 tablet by mouth daily.    Marland Kitchen OLANZapine (ZYPREXA) 10 MG tablet Take 1 tablet (10 mg total) by mouth at bedtime. 90 tablet 0  . omeprazole (PRILOSEC) 40 MG capsule Take 1 capsule (40 mg total) by mouth 2 (two) times daily. 180 capsule 3   . Probiotic Product (PROBIOTIC-10) CHEW Chew 1 each by mouth daily.    . traZODone (DESYREL) 100 MG tablet Take 1 tablet (100 mg total) by mouth at bedtime. 90 tablet 0  . valACYclovir (VALTREX) 500 MG tablet Take 1 tablet (500 mg total) by mouth daily. (Patient taking differently: Take 500 mg by mouth daily as needed (break out). ) 90 tablet 3   No current facility-administered medications for this visit.      Musculoskeletal: Strength & Muscle Tone: within normal limits Gait & Station: normal Patient leans: N/A  Psychiatric Specialty Exam: ROS  Blood pressure 125/83, pulse 92, height 5\' 5"  (1.651 m), weight 213 lb (96.6 kg), SpO2 99 %.There is no height or weight on file to calculate BMI.  General Appearance: Casual  Eye Contact:  Good  Speech:  Clear and Coherent  Volume:  Normal  Mood:  Anxious  Affect:  Congruent  Thought Process:  Goal Directed  Orientation:  Full (Time, Place, and Person)  Thought Content: Rumination   Suicidal Thoughts:  No  Homicidal Thoughts:  No  Memory:  Immediate;   Good Recent;   Good Remote;   Good  Judgement:  Good  Insight:  Good  Psychomotor Activity:  Normal  Concentration:  Concentration: Good and Attention Span: Good  Recall:  Good  Fund of Knowledge: Good  Language: Good  Akathisia:  No  Handed:  Right  AIMS (if indicated): not done  Assets:  Communication Skills Desire for Improvement Resilience  ADL's:  Intact  Cognition: WNL  Sleep:  Fair   Screenings: PHQ2-9     Office Visit from confidential encounter on 07/09/2018 Office Visit from 03/29/2017 in [employer] Office Visit from 02/08/2017 in [place] Office Visit from 07/04/2016 in [place] Counselor from 02/16/2016 in [place]  PHQ-2 Total Score  1  0  0  1  6  PHQ-9 Total Score  -  -  -  1  23       Assessment and Plan: Bipolar disorder type I.  Generalized anxiety disorder.     I reviewed her blood work results and discuss her psychosocial stressors.  Recommended to cut down her work as it is causing her increased anxiety and stress.  I also offer to start counseling she used to see a therapist in this office.  Patient agreed to schedule appointment with Charolotte Eke as she will start therapy in January.  Patient like to continue her medication.  She has no side effects.  She has no tremors shakes or any EPS.  Continue Ativan 1 mg twice a day, Wellbutrin XL 300 mg daily, trazodone 100 mg at bedtime and Zyprexa 10 mg daily.  Recommended to call us back if he has any question, concern or if he feels worsening of the symptom.  Follow-up in 3 months.  Courage healthy lifestyle and watch her calorie intake.   Kathlee Nations, MD 07/19/2018, 2:51 PM

## 2018-07-20 NOTE — Telephone Encounter (Signed)
Please let patient know that her 10 year risk of developing heart disease is 7.8% Good blood pressure control and not smoking are very important in her overall risk I would like for her to try lifestyle changes first, lets recheck in 3 months, if unchanged then lets start medication I recommend a healthier diet, regular exercise and healthy weight. Diet low in fried, fatty, processed foods and red meat  Diet high in vegetables, fruits, poultry, salmon, nuts (almonds, peanuts, walnuts), seeds (sunflower, pumpkin and sesame), fiber, olive oil, wheat germ, oat bran and brown rice.   Thanks

## 2018-07-22 ENCOUNTER — Other Ambulatory Visit: Payer: Self-pay | Admitting: Internal Medicine

## 2018-07-23 NOTE — Telephone Encounter (Signed)
May I refill, she has an appointment Sir on December 20th.

## 2018-07-23 NOTE — Telephone Encounter (Signed)
Pt advised of note per Dr. Pamella Pert. Pt stated okay and she will try it.

## 2018-07-24 NOTE — Telephone Encounter (Signed)
Refill x 90 days 

## 2018-08-07 ENCOUNTER — Telehealth: Payer: Self-pay | Admitting: Obstetrics and Gynecology

## 2018-08-07 NOTE — Telephone Encounter (Signed)
Received a message from [facility]. Called patient, and she stated she could not come on the date given. She needed an appointment later in the day.

## 2018-08-24 ENCOUNTER — Ambulatory Visit: Payer: Self-pay | Admitting: Internal Medicine

## 2018-09-03 ENCOUNTER — Ambulatory Visit (HOSPITAL_COMMUNITY): Payer: 59 | Admitting: Licensed Clinical Social Worker

## 2018-09-10 ENCOUNTER — Encounter: Payer: Self-pay | Admitting: Obstetrics & Gynecology

## 2018-09-19 ENCOUNTER — Ambulatory Visit: Payer: Self-pay | Admitting: Internal Medicine

## 2018-10-01 ENCOUNTER — Ambulatory Visit (INDEPENDENT_AMBULATORY_CARE_PROVIDER_SITE_OTHER): Payer: 59 | Admitting: Obstetrics and Gynecology

## 2018-10-01 ENCOUNTER — Encounter: Payer: Self-pay | Admitting: Obstetrics and Gynecology

## 2018-10-01 VITALS — BP 139/85 | HR 102 | Ht 65.5 in | Wt 210.3 lb

## 2018-10-01 DIAGNOSIS — Z1151 Encounter for screening for human papillomavirus (HPV): Secondary | ICD-10-CM

## 2018-10-01 DIAGNOSIS — Z8619 Personal history of other infectious and parasitic diseases: Secondary | ICD-10-CM | POA: Diagnosis not present

## 2018-10-01 DIAGNOSIS — Z124 Encounter for screening for malignant neoplasm of cervix: Secondary | ICD-10-CM | POA: Diagnosis not present

## 2018-10-01 DIAGNOSIS — Z01419 Encounter for gynecological examination (general) (routine) without abnormal findings: Secondary | ICD-10-CM | POA: Diagnosis not present

## 2018-10-01 MED ORDER — VALACYCLOVIR HCL 500 MG PO TABS: 500.0000 mg | ORAL_TABLET | Freq: Every day | ORAL | 3 refills | Status: DC

## 2018-10-01 NOTE — Patient Instructions (Signed)

## 2018-10-04 ENCOUNTER — Telehealth: Payer: Self-pay | Admitting: *Deleted

## 2018-10-04 LAB — CYTOLOGY - PAP
ADEQUACY: ABSENT
DIAGNOSIS: NEGATIVE
HPV (WINDOPATH): DETECTED — AB
HPV 16/18/45 GENOTYPING: NEGATIVE

## 2018-10-04 NOTE — Telephone Encounter (Signed)
Called patient per message from Dr. Rip Harbour and left a message I was calling you with some results that are not urgent- I will send you a message thru [place]. Call us or message if you have questions.

## 2018-10-04 NOTE — Telephone Encounter (Signed)
-----   Message from Chancy [place], MD sent at 10/04/2018  3:09 PM EST ----- Please let MS [place] know that her pap smear was normal except for HPV Recommend repeat in 1 yr  Thanks Legrand Como

## 2018-10-09 ENCOUNTER — Encounter: Payer: Self-pay | Admitting: Family Medicine

## 2018-10-09 ENCOUNTER — Other Ambulatory Visit: Payer: Self-pay

## 2018-10-09 ENCOUNTER — Ambulatory Visit (INDEPENDENT_AMBULATORY_CARE_PROVIDER_SITE_OTHER): Payer: 59 | Admitting: Family Medicine

## 2018-10-09 VITALS — BP 131/81 | HR 97 | Temp 99.0°F | Ht 65.5 in | Wt 213.9 lb

## 2018-10-09 DIAGNOSIS — F17200 Nicotine dependence, unspecified, uncomplicated: Secondary | ICD-10-CM | POA: Diagnosis not present

## 2018-10-09 DIAGNOSIS — I10 Essential (primary) hypertension: Secondary | ICD-10-CM

## 2018-10-09 DIAGNOSIS — E785 Hyperlipidemia, unspecified: Secondary | ICD-10-CM | POA: Diagnosis not present

## 2018-10-09 DIAGNOSIS — E559 Vitamin D deficiency, unspecified: Secondary | ICD-10-CM | POA: Diagnosis not present

## 2018-10-09 DIAGNOSIS — Z6835 Body mass index (BMI) 35.0-35.9, adult: Secondary | ICD-10-CM

## 2018-10-09 NOTE — Patient Instructions (Signed)
° ° ° °  If you have lab work done today you will be contacted with your lab results within the next 2 weeks.  If you have not heard from us then please contact us. The fastest way to get your results is to register for My Chart. ° ° °IF you received an x-ray today, you will receive an invoice from [facility]. Please contact [facility] at [phone] with questions or concerns regarding your invoice.  ° °IF you received labwork today, you will receive an invoice from [facility]. Please contact [facility] at [phone] with questions or concerns regarding your invoice.  ° °Our billing staff will not be able to assist you with questions regarding bills from these companies. ° °You will be contacted with the lab results as soon as they are available. The fastest way to get your results is to activate your My Chart account. Instructions are located on the last page of this paperwork. If you have not heard from us regarding the results in 2 weeks, please contact this office. °  ° ° ° °

## 2018-10-09 NOTE — Progress Notes (Signed)
2/4/20204:05 PM  Sandra Miller 1976-05-05, 43 y.o. female [id number]  Chief Complaint  Patient presents with  . Hypertension    lisinopril needs to be refilled    HPI:   Patient is a 43 y.o. female with past medical history significant for HTN, HLP, bipolar disorder and GAD who presents today for routine followup  Last OV nov 2019 overalll doing well Taking BP meds as prescribed Smoking is a "work on progress" Working 3 job, feels stressed and tired Smoking less 1 ppd Not using patches and gum  Wt Readings from Last 3 Encounters:  10/09/18 213 lb 14.4 oz (97 kg)  10/01/18 210 lb 4.8 oz (95.4 kg)  07/09/18 212 lb 9.6 oz (96.4 kg)    Lab Results  Component Value Date   CREATININE 0.93 12/27/2017   Lab Results  Component Value Date   NA 137 12/27/2017   K 4.0 12/27/2017   CL 103 12/27/2017   CO2 25 12/27/2017   Lab Results  Component Value Date   CHOL 198 07/09/2018   HDL 41 07/09/2018   LDLCALC 134 (H) 07/09/2018   TRIG 116 07/09/2018   CHOLHDL 4.8 (H) 07/09/2018    Fall Risk  10/09/2018 07/09/2018 07/09/2018 03/29/2017 11/03/2015  Falls in the past year? 0 0 0 No No  Number falls in past yr: 0 - - - -  Injury with Fall? 0 - - - -     Depression screen [facility] 2/9 10/09/2018 10/01/2018 07/09/2018  Decreased Interest 0 0 0  Down, Depressed, Hopeless 0 0 1  PHQ - 2 Score 0 0 1  Altered sleeping - 0 -  Tired, decreased energy - 2 -  Change in appetite - 0 -  Feeling bad or failure about yourself  - 0 -  Trouble concentrating - 1 -  Moving slowly or fidgety/restless - 0 -  Suicidal thoughts - 0 -  PHQ-9 Score - 3 -  Some encounter information is confidential and restricted. Go to Review Flowsheets activity to see all data.  Some recent data might be hidden    Allergies  Allergen Reactions  . Depakote [Divalproex Sodium] Nausea And Vomiting  . Minocycline Hives  . Aspirin Hives    States able to tolerate Goody Powders without any problem except for GI upset     Prior to Admission medications   Medication Sig Start Date End Date Taking? Authorizing Provider  acetaminophen (TYLENOL) 500 MG tablet Take 1,000 mg by mouth every 6 (six) hours as needed for moderate pain or fever.   Yes [provider]  buPROPion (WELLBUTRIN XL) 300 MG 24 hr tablet Take 1 tablet (300 mg total) by mouth every morning. 07/19/18 07/19/19 Yes Arfeen, Arlyce Harman, MD  calcium-vitamin D (OSCAL WITH D) 500-200 MG-UNIT tablet Take 1 tablet by mouth.   Yes [provider]  Cyanocobalamin (VITAMIN B-12) 1000 MCG SUBL Place 1,000 mcg under the tongue 3 (three) times a week.   Yes [provider]  docusate sodium (COLACE) 100 MG capsule Take 1 capsule (100 mg total) by mouth 2 (two) times daily. Patient taking differently: Take 100 mg by mouth daily as needed for mild constipation.  03/10/16  Yes Donne Hazel, MD  gabapentin (NEURONTIN) 300 MG capsule TAKE ONE CAPSULE BY MOUTH DAILY 07/24/18  Yes Gatha Mayer, MD  lisinopril-hydrochlorothiazide (ZESTORETIC) 20-12.5 MG tablet Take 1 tablet by mouth daily. 07/09/18  Yes Rutherford Guys, MD  LORazepam (ATIVAN) 1 MG tablet Take  1 tablet (1 mg total) by mouth 2 (two) times daily. 07/19/18  Yes Arfeen, Arlyce Harman, MD  Multiple Vitamin (MULTIVITAMIN) tablet Take 1 tablet by mouth daily.   Yes [provider]  OLANZapine (ZYPREXA) 10 MG tablet Take 1 tablet (10 mg total) by mouth at bedtime. 07/19/18 07/19/19 Yes Arfeen, Arlyce Harman, MD  omeprazole (PRILOSEC) 40 MG capsule Take 1 capsule (40 mg total) by mouth 2 (two) times daily. 03/19/18  Yes Gatha Mayer, MD  Probiotic Product (PROBIOTIC-10) CHEW Chew 1 each by mouth daily.   Yes [provider]  traZODone (DESYREL) 100 MG tablet Take 1 tablet (100 mg total) by mouth at bedtime. 07/19/18  Yes Arfeen, Arlyce Harman, MD  valACYclovir (VALTREX) 500 MG tablet Take 1 tablet (500 mg total) by mouth daily. 10/01/18  Yes Chancy Milroy, MD    Past Medical History:    Diagnosis Date  . Bipolar 1 disorder ([place])   . Complication of anesthesia    wakes up during procedures  . GAD (generalized anxiety disorder)   . Genital HSV    currently per pt  no break out 03-22-2016   . GERD (gastroesophageal reflux disease)   . Hiatal hernia   . History of cervical dysplasia    2012 laser ablation  . History of esophageal dilatation    for dysphasia -- x2 dilated  . History of gastric ulcer   . History of Helicobacter pylori infection    remote hx  . History of hidradenitis suppurativa    "gets all over body intermittantly"    . History of hypertension    no issue since stopped drinking alcohol 2014  . History of kidney stones   . History of panic attacks   . Iron deficiency anemia   . Left ureteral stone   . OCD (obsessive compulsive disorder)   . PTSD (post-traumatic stress disorder)   . Recovering alcoholic in remission ([place])    since 2014  . RLS (restless legs syndrome)   . Smokers' cough ([place])   . Urgency of urination   . Yeast infection involving the vagina and surrounding area    secondary to taking antibiotic    Past Surgical History:  Procedure Laterality Date  . CESAREAN SECTION  1995   w/  Bilateral Tubal Ligation  . COLONOSCOPY  last one 08-09-2013  . CYSTOSCOPY W/ URETERAL STENT PLACEMENT Left 03/29/2016   Procedure: CYSTOSCOPY WITH STENT REPLACEMENT;  Surgeon: Nickie Retort, MD;  Location: [facility];  Service: Urology;  Laterality: Left;  . CYSTOSCOPY WITH RETROGRADE PYELOGRAM, URETEROSCOPY AND STENT PLACEMENT Left 03/08/2016   Procedure: CYSTOSCOPY WITH  LEFT RETROGRADE PYELOGRAM, AND STENT PLACEMENT;  Surgeon: Nickie Retort, MD;  Location: [facility];  Service: Urology;  Laterality: Left;  . CYSTOSCOPY/RETROGRADE/URETEROSCOPY/STONE EXTRACTION WITH BASKET Left 03/29/2016   Procedure: CYSTOSCOPY/RETROGRADE/URETEROSCOPY/STONE EXTRACTION WITH BASKET;  Surgeon: Nickie Retort, MD;  Location: [facility];  Service: Urology;  Laterality: Left;  . ENDOMETRIAL ABLATION W/ NOVASURE  04-01-2010  . ESOPHAGOGASTRODUODENOSCOPY  last one 08-09-2013  . KNEE ARTHROSCOPY Left as teen  . LASER ABLATION OF THE CERVIX  2012 approx  . TRANSTHORACIC ECHOCARDIOGRAM  05-19-2006   lvsf normal, ef 55-65%, there was mild flattening of the interventricular septum during diastoli/  RV size at upper limits normal  . TUBAL LIGATION    . WISDOM TOOTH EXTRACTION  age 21 's    Social History   Tobacco Use  . Smoking  status: Current Every Day Smoker    Packs/day: 1.00    Years: 22.00    Pack years: 22.00    Types: Cigarettes  . Smokeless tobacco: Never Used  . Tobacco comment: Recently started a smoking cessation class.   Substance Use Topics  . Alcohol use: Not Currently    Alcohol/week: 0.0 standard drinks    Comment:  hx alcohollism  in remission since 2014    Family History  Problem Relation Age of Onset  . Heart disease Father   . Lung cancer Father   . Alcohol abuse Father   . Heart disease Mother   . Depression Mother   . Anxiety disorder Mother   . Drug abuse Brother   . Alcohol abuse Brother   . Drug abuse Brother   . ADD / ADHD Brother   . Colon cancer Brother        malignant polyp < 60 in 1  . Stomach cancer Paternal Grandfather   . Diabetes Maternal Grandfather   . Diabetes Paternal Grandmother   . Kidney disease Maternal Uncle   . Cirrhosis Cousin        alcoholic  . Anxiety disorder Maternal Aunt   . Depression Maternal Aunt     Review of Systems  Constitutional: Negative for chills and fever.  Respiratory: Negative for cough and shortness of breath.   Cardiovascular: Negative for chest pain, palpitations and leg swelling.  Gastrointestinal: Negative for abdominal pain, nausea and vomiting.     OBJECTIVE:  Blood pressure 131/81, pulse 97, temperature 99 F (37.2 C), temperature source Oral, height 5' 5.5" (1.664 m), weight 213 lb 14.4 oz (97 kg), SpO2 97 %. Body  mass index is 35.05 kg/m.   Physical Exam Vitals signs and nursing note reviewed.  Constitutional:      Appearance: She is well-developed.  HENT:     Head: Normocephalic and atraumatic.  Eyes:     General: No scleral icterus.    Conjunctiva/sclera: Conjunctivae normal.     Pupils: Pupils are equal, round, and reactive to light.  Neck:     Musculoskeletal: Neck supple.  Pulmonary:     Effort: Pulmonary effort is normal.  Skin:    General: Skin is warm and dry.  Neurological:     Mental Status: She is alert and oriented to person, place, and time.      ASSESSMENT and PLAN  1. Essential hypertension Controlled. Continue current regime.  - CMP14+EGFR - Lipid panel - TSH  2. Vitamin D deficiency Checking labs today, medications will be adjusted as needed.  - VITAMIN D 25 Hydroxy (Vit-D Deficiency, Fractures)  3. Hyperlipidemia, unspecified hyperlipidemia type ASCD risk 7.8%. discussed LFM. Patient educational handout given. Discussed RF modification. Consider statin. Recheck in 3 months.  4. Tobacco use disorder Smoking cessation instruction/counseling given for 3-10 minutes:  counseled patient on the dangers of tobacco use, advised patient to stop smoking, and reviewed strategies to maximize success  5. BMI 35.0-35.9, adult Discussed weight loss strategies.   Return in about 3 months (around 01/07/2019).    Rutherford Guys, MD [facility] [place], [place] [id number] Ph.  [phone] Fax [phone]

## 2018-10-10 LAB — CMP14+EGFR
ALT: 13 IU/L (ref 0–32)
AST: 17 IU/L (ref 0–40)
Albumin/Globulin Ratio: 1.5 (ref 1.2–2.2)
Albumin: 4.2 g/dL (ref 3.8–4.8)
Alkaline Phosphatase: 70 IU/L (ref 39–117)
BUN/Creatinine Ratio: 17 (ref 9–23)
BUN: 12 mg/dL (ref 6–24)
Bilirubin Total: 0.2 mg/dL (ref 0.0–1.2)
CO2: 22 mmol/L (ref 20–29)
Calcium: 10.1 mg/dL (ref 8.7–10.2)
Chloride: 103 mmol/L (ref 96–106)
Creatinine, Ser: 0.72 mg/dL (ref 0.57–1.00)
GFR calc Af Amer: 118 mL/min/{1.73_m2} (ref >59)
GFR calc non Af Amer: 102 mL/min/{1.73_m2} (ref >59)
Globulin, Total: 2.8 g/dL (ref 1.5–4.5)
Glucose: 81 mg/dL (ref 65–99)
Potassium: 4.1 mmol/L (ref 3.5–5.2)
Sodium: 141 mmol/L (ref 134–144)
Total Protein: 7 g/dL (ref 6.0–8.5)

## 2018-10-10 LAB — LIPID PANEL
Chol/HDL Ratio: 4.3 ratio (ref 0.0–4.4)
Cholesterol, Total: 193 mg/dL (ref 100–199)
HDL: 45 mg/dL (ref >39)
LDL Calculated: 115 mg/dL — ABNORMAL HIGH (ref 0–99)
Triglycerides: 163 mg/dL — ABNORMAL HIGH (ref 0–149)
VLDL Cholesterol Cal: 33 mg/dL (ref 5–40)

## 2018-10-10 LAB — TSH: TSH: 0.757 u[IU]/mL (ref 0.450–4.500)

## 2018-10-10 LAB — VITAMIN D 25 HYDROXY (VIT D DEFICIENCY, FRACTURES): Vit D, 25-Hydroxy: 32.5 ng/mL (ref 30.0–100.0)

## 2018-10-15 ENCOUNTER — Encounter (HOSPITAL_COMMUNITY): Payer: Self-pay | Admitting: Psychiatry

## 2018-10-15 ENCOUNTER — Ambulatory Visit (INDEPENDENT_AMBULATORY_CARE_PROVIDER_SITE_OTHER): Payer: 59 | Admitting: Psychiatry

## 2018-10-15 DIAGNOSIS — F319 Bipolar disorder, unspecified: Secondary | ICD-10-CM | POA: Diagnosis not present

## 2018-10-15 DIAGNOSIS — F411 Generalized anxiety disorder: Secondary | ICD-10-CM

## 2018-10-15 MED ORDER — TRAZODONE HCL 100 MG PO TABS: 100.0000 mg | ORAL_TABLET | Freq: Every day | ORAL | 0 refills | Status: DC

## 2018-10-15 MED ORDER — TOPIRAMATE 25 MG PO TABS: 25.0000 mg | ORAL_TABLET | Freq: Every day | ORAL | 0 refills | Status: DC

## 2018-10-15 MED ORDER — BUPROPION HCL ER (XL) 300 MG PO TB24: 300.0000 mg | ORAL_TABLET | ORAL | 0 refills | Status: DC

## 2018-10-15 MED ORDER — OLANZAPINE 15 MG PO TABS: 15.0000 mg | ORAL_TABLET | Freq: Every day | ORAL | 0 refills | Status: DC

## 2018-10-15 MED ORDER — LORAZEPAM 1 MG PO TABS: 1.0000 mg | ORAL_TABLET | Freq: Two times a day (BID) | ORAL | 2 refills | Status: DC

## 2018-10-15 NOTE — Progress Notes (Signed)
BH MD/PA/NP OP Progress Note  10/15/2018 3:55 PM Sandra Miller  MRN:  [id number]  Chief Complaint: I am feeling very lonely.  I have a lot of anxiety for everything.  HPI: Sandra Miller came for her follow-up appointment.  She has been experiencing increased anxiety, fear, irritability and sadness.  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  She admitted working at least 60 hours a week and did not have a time for herself.  We schedule appointment to see Charolotte Eke but appointment was canceled.  She admitted that she lost her grandkids.  She worried about her children.  She lives by herself.  She raised her children by herself and now she feels emptiness.  She gets easily tired and some nights even though she is tired she did not had a good night sleep.  She denies any paranoia, hallucination or any suicidal thoughts.  She endorsed some time racing thoughts, thinking about her family member but denies any mania or any agitation.  We have discussed her blood work results and recently she seen her primary care physician and last week she had blood work.  Her lipid panel shows decrease in LDL but is still high.  Her triglycerides are 160.  She was also advised by primary care physician to take vitamin D but she has not started yet.  She is watching her diet do not have time for exercise.  She is taking Zyprexa, Wellbutrin, trazodone and Ativan.  She admitted some time headaches and she used to take Topamax but it was discontinued as she was feeling better.  She is afraid that she is getting into same condition that she used to have few years ago.  Patient denies drinking or using any illegal substances.  She denies any homicidal thoughts or any aggression.  Visit Diagnosis:    ICD-10-CM   1. Bipolar 1 disorder (HCC) F31.9 traZODone (DESYREL) 100 MG tablet    OLANZapine (ZYPREXA) 15 MG tablet    topiramate (TOPAMAX) 25 MG tablet    buPROPion (WELLBUTRIN XL) 300 MG 24 hr tablet   2. GAD (generalized anxiety disorder) F41.1 LORazepam (ATIVAN) 1 MG tablet    Past Psychiatric History: Reviewed. H/O bipolar disorder, alcohol dependence and marijuana abuse. H/O at least 6 psychiatric hospitalization. Tried lithium, Seroquel, Lamictal, Paxil, Celexa, Abilify, Geodon, Depakote, Neurontin and Cymbalta. No H/o history of suicidal attempt.   Past Medical History:  Past Medical History:  Diagnosis Date  . Bipolar 1 disorder ([place])   . Complication of anesthesia    wakes up during procedures  . GAD (generalized anxiety disorder)   . Genital HSV    currently per pt  no break out 03-22-2016   . GERD (gastroesophageal reflux disease)   . Hiatal hernia   . History of cervical dysplasia    2012 laser ablation  . History of esophageal dilatation    for dysphasia -- x2 dilated  . History of gastric ulcer   . History of Helicobacter pylori infection    remote hx  . History of hidradenitis suppurativa    "gets all over body intermittantly"    . History of hypertension    no issue since stopped drinking alcohol 2014  . History of kidney stones   . History of panic attacks   . Iron deficiency anemia   . Left ureteral stone   . OCD (obsessive compulsive disorder)   . PTSD (post-traumatic stress disorder)   . Recovering  alcoholic in remission ([place])    since 2014  . RLS (restless legs syndrome)   . Smokers' cough ([place])   . Urgency of urination   . Yeast infection involving the vagina and surrounding area    secondary to taking antibiotic    Past Surgical History:  Procedure Laterality Date  . CESAREAN SECTION  1995   w/  Bilateral Tubal Ligation  . COLONOSCOPY  last one 08-09-2013  . CYSTOSCOPY W/ URETERAL STENT PLACEMENT Left 03/29/2016   Procedure: CYSTOSCOPY WITH STENT REPLACEMENT;  Surgeon: Nickie Retort, MD;  [facility]: [facility];  Service: Urology;  Laterality: Left;  . CYSTOSCOPY WITH RETROGRADE PYELOGRAM, URETEROSCOPY AND STENT PLACEMENT  Left 03/08/2016   Procedure: CYSTOSCOPY WITH  LEFT RETROGRADE PYELOGRAM, AND STENT PLACEMENT;  Surgeon: Nickie Retort, MD;  Location: [facility];  Service: Urology;  Laterality: Left;  . CYSTOSCOPY/RETROGRADE/URETEROSCOPY/STONE EXTRACTION WITH BASKET Left 03/29/2016   Procedure: CYSTOSCOPY/RETROGRADE/URETEROSCOPY/STONE EXTRACTION WITH BASKET;  Surgeon: Nickie Retort, MD;  Location: [facility];  Service: Urology;  Laterality: Left;  . ENDOMETRIAL ABLATION W/ NOVASURE  04-01-2010  . ESOPHAGOGASTRODUODENOSCOPY  last one 08-09-2013  . KNEE ARTHROSCOPY Left as teen  . LASER ABLATION OF THE CERVIX  2012 approx  . TRANSTHORACIC ECHOCARDIOGRAM  05-19-2006   lvsf normal, ef 55-65%, there was mild flattening of the interventricular septum during diastoli/  RV size at upper limits normal  . TUBAL LIGATION    . WISDOM TOOTH EXTRACTION  age 47 's    Family Psychiatric History: Reviewed.  Family History:  Family History  Problem Relation Age of Onset  . Heart disease Father   . Lung cancer Father   . Alcohol abuse Father   . Heart disease Mother   . Depression Mother   . Anxiety disorder Mother   . Drug abuse Brother   . Alcohol abuse Brother   . Drug abuse Brother   . ADD / ADHD Brother   . Colon cancer Brother        malignant polyp < 60 in 1  . Stomach cancer Paternal Grandfather   . Diabetes Maternal Grandfather   . Diabetes Paternal Grandmother   . Kidney disease Maternal Uncle   . Cirrhosis Cousin        alcoholic  . Anxiety disorder Maternal Aunt   . Depression Maternal Aunt     Social History:  Social History   Socioeconomic History  . Marital status: Single    Spouse name: Not on file  . Number of children: Not on file  . Years of education: Not on file  . Highest education level: Not on file  Occupational History  . Not on file  Social Needs  . Financial resource strain: Not on file  . Food insecurity:    Worry: Never true    Inability: Never  true  . Transportation needs:    Medical: No    Non-medical: No  Tobacco Use  . Smoking status: Current Every Day Smoker    Packs/day: 1.00    Years: 22.00    Pack years: 22.00    Types: Cigarettes  . Smokeless tobacco: Never Used  . Tobacco comment: Recently started a smoking cessation class.   Substance and Sexual Activity  . Alcohol use: Not Currently    Alcohol/week: 0.0 standard drinks    Comment:  hx alcohollism  in remission since 2014  . Drug use: No    Comment: hx  marijuana use  .  Sexual activity: Yes    Partners: Male    Birth control/protection: Surgical  Lifestyle  . Physical activity:    Days per week: Not on file    Minutes per session: Not on file  . Stress: Not on file  Relationships  . Social connections:    Talks on phone: Not on file    Gets together: Not on file    Attends religious service: Not on file    Active member of club or organization: Not on file    Attends meetings of clubs or organizations: Not on file    Relationship status: Not on file  Other Topics Concern  . Not on file  Social History Narrative   Former [facility] patient.      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    In school at [employer] to become Psychologist, sport and exercise.     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      Last updated   01/23/2017    Allergies:  Allergies  Allergen Reactions  . Depakote [Divalproex Sodium] Nausea And Vomiting  . Minocycline Hives  . Aspirin Hives    States able to tolerate Goody Powders without any problem except for GI upset    Metabolic Disorder Labs: Recent Results (from the past 2160 hour(s))  Cytology - PAP     Status: Abnormal   Collection Time: 10/01/18 12:00 AM  Result Value Ref Range   Adequacy      Satisfactory for evaluation  endocervical/transformation zone component ABSENT.   Diagnosis      NEGATIVE FOR INTRAEPITHELIAL LESIONS OR  MALIGNANCY.   Diagnosis      FUNGAL ORGANISMS PRESENT CONSISTENT WITH CANDIDA SPP.   HPV 16/18/45 genotyping NEGATIVE for HPV 16 & 18/45     Comment: Normal Reference Range - Negative   HPV DETECTED (A)     Comment: Normal Reference Range - NOT Detected   Material Submitted CervicoVaginal Pap [ThinPrep Imaged]   CMP14+EGFR     Status: None   Collection Time: 10/09/18  4:23 PM  Result Value Ref Range   Glucose 81 65 - 99 mg/dL   BUN 12 6 - 24 mg/dL   Creatinine, Ser 0.72 0.57 - 1.00 mg/dL   GFR calc non Af Amer 102 >59 mL/min/1.73   GFR calc Af Amer 118 >59 mL/min/1.73   BUN/Creatinine Ratio 17 9 - 23   Sodium 141 134 - 144 mmol/L   Potassium 4.1 3.5 - 5.2 mmol/L   Chloride 103 96 - 106 mmol/L   CO2 22 20 - 29 mmol/L   Calcium 10.1 8.7 - 10.2 mg/dL   Total Protein 7.0 6.0 - 8.5 g/dL   Albumin 4.2 3.8 - 4.8 g/dL    Comment:               **Please note reference interval change**   Globulin, Total 2.8 1.5 - 4.5 g/dL   Albumin/Globulin Ratio 1.5 1.2 - 2.2   Bilirubin Total 0.2 0.0 - 1.2 mg/dL   Alkaline Phosphatase 70 39 - 117 IU/L   AST 17 0 - 40 IU/L   ALT 13 0 - 32 IU/L  Lipid panel     Status: Abnormal   Collection Time: 10/09/18  4:23 PM  Result Value Ref Range   Cholesterol, Total 193 100 - 199 mg/dL  Triglycerides 163 (H) 0 - 149 mg/dL   HDL 45 >39 mg/dL   VLDL Cholesterol Cal 33 5 - 40 mg/dL   LDL Calculated 115 (H) 0 - 99 mg/dL   Chol/HDL Ratio 4.3 0.0 - 4.4 ratio    Comment:                                   T. Chol/HDL Ratio                                             Men  Women                               1/2 Avg.Risk  3.4    3.3                                   Avg.Risk  5.0    4.4                                2X Avg.Risk  9.6    7.1                                3X Avg.Risk 23.4   11.0   VITAMIN D 25 Hydroxy (Vit-D Deficiency, Fractures)     Status: None   Collection Time: 10/09/18  4:23 PM  Result Value Ref Range   Vit D, 25-Hydroxy 32.5 30.0 -  100.0 ng/mL    Comment: Vitamin D deficiency has been defined by the [place] practice guideline as a level of serum 25-OH vitamin D less than 20 ng/mL (1,2). The Endocrine Society went on to further define vitamin D insufficiency as a level between 21 and 29 ng/mL (2). 1. IOM (Institute of Medicine). 2010. Dietary reference    intakes for calcium and D. [place]: The    [employer]. 2. Holick MF, Binkley , Bischoff-Ferrari HA, et al.    Evaluation, treatment, and prevention of vitamin D    deficiency: an Endocrine Society clinical practice    guideline. JCEM. 2011 Jul; 96(7):1911-30.   TSH     Status: None   Collection Time: 10/09/18  4:23 PM  Result Value Ref Range   TSH 0.757 0.450 - 4.500 uIU/mL   Lab Results  Component Value Date   HGBA1C 5.2 11/10/2016   MPG 103 11/10/2016   No results found for: PROLACTIN Lab Results  Component Value Date   CHOL 193 10/09/2018   TRIG 163 (H) 10/09/2018   HDL 45 10/09/2018   CHOLHDL 4.3 10/09/2018   VLDL 32 06/13/2008   LDLCALC 115 (H) 10/09/2018   LDLCALC 134 (H) 07/09/2018   Lab Results  Component Value Date   TSH 0.757 10/09/2018   TSH 0.503 07/09/2018    Therapeutic Level Labs: No results found for: LITHIUM No results found for: VALPROATE No components found for:  CBMZ  Current Medications: Current Outpatient Medications  Medication Sig Dispense Refill  . acetaminophen (TYLENOL) 500 MG tablet Take 1,000 mg by  mouth every 6 (six) hours as needed for moderate pain or fever.    Marland Kitchen buPROPion (WELLBUTRIN XL) 300 MG 24 hr tablet Take 1 tablet (300 mg total) by mouth every morning. 90 tablet 0  . calcium-vitamin D (OSCAL WITH D) 500-200 MG-UNIT tablet Take 1 tablet by mouth.    . Cyanocobalamin (VITAMIN B-12) 1000 MCG SUBL Place 1,000 mcg under the tongue 3 (three) times a week.    . docusate sodium (COLACE) 100 MG capsule Take 1 capsule (100 mg total) by mouth 2 (two)  times daily. (Patient taking differently: Take 100 mg by mouth daily as needed for mild constipation. ) 10 capsule 0  . gabapentin (NEURONTIN) 300 MG capsule TAKE ONE CAPSULE BY MOUTH DAILY 90 capsule 0  . lisinopril-hydrochlorothiazide (ZESTORETIC) 20-12.5 MG tablet Take 1 tablet by mouth daily. 90 tablet 1  . LORazepam (ATIVAN) 1 MG tablet Take 1 tablet (1 mg total) by mouth 2 (two) times daily. 60 tablet 2  . Multiple Vitamin (MULTIVITAMIN) tablet Take 1 tablet by mouth daily.    Marland Kitchen OLANZapine (ZYPREXA) 10 MG tablet Take 1 tablet (10 mg total) by mouth at bedtime. 90 tablet 0  . omeprazole (PRILOSEC) 40 MG capsule Take 1 capsule (40 mg total) by mouth 2 (two) times daily. 180 capsule 3  . Probiotic Product (PROBIOTIC-10) CHEW Chew 1 each by mouth daily.    . traZODone (DESYREL) 100 MG tablet Take 1 tablet (100 mg total) by mouth at bedtime. 90 tablet 0  . valACYclovir (VALTREX) 500 MG tablet Take 1 tablet (500 mg total) by mouth daily. 90 tablet 3   No current facility-administered medications for this visit.      Musculoskeletal: Strength & Muscle Tone: within normal limits Gait & Station: normal Patient leans: N/A  Psychiatric Specialty Exam: ROS  Blood pressure 126/74, height 5' 5.5" (1.664 m), weight 211 lb (95.7 kg).There is no height or weight on file to calculate BMI.  General Appearance: Casual  Eye Contact:  Good  Speech:  Clear and Coherent  Volume:  Normal  Mood:  Anxious, Depressed and Irritable  Affect:  Constricted  Thought Process:  Goal Directed  Orientation:  Full (Time, Place, and Person)  Thought Content: Rumination   Suicidal Thoughts:  No  Homicidal Thoughts:  No  Memory:  Immediate;   Good Recent;   Good Remote;   Good  Judgement:  Good  Insight:  Fair  Psychomotor Activity:  Decreased  Concentration:  Concentration: Fair and Attention Span: Fair  Recall:  Good  Fund of Knowledge: Good  Language: Good  Akathisia:  No  Handed:  Right  AIMS (if  indicated): not done  Assets:  Communication Skills Desire for Improvement Housing Transportation  ADL's:  Intact  Cognition: WNL  Sleep:  Fair   Screenings: GAD-7     Office Visit from 10/01/2018 in [facility] for [place]  Total GAD-7 Score  14    PHQ2-9     Office Visit from 10/09/2018 in [facility] at [facility] from 10/01/2018 in [facility] for [place] Office Visit from 07/09/2018 in Primary Care at [place] from 03/29/2017 in [facility] Office Visit from 02/08/2017 in [facility]  PHQ-2 Total Score  0  0  1  0  0  PHQ-9 Total Score  -  3  -  -  -       Assessment and Plan: Bipolar disorder type I.  Generalized  anxiety disorder.  Discussed her psychosocial stressors.  Patient endorses that she is willing to cut down her work if position available to work as a as needed in the hospital.  I also reminded that she should make a time and schedule appointment to see a therapist.  We will schedule appointment to see [place] in this office for counseling.  I will try Zyprexa 15 mg at bedtime to help her irritability depression and racing thoughts.  She used to take higher dose of Zyprexa but dose decreased when she start feeling better.  I would also restart Topamax 25 mg at bedtime to help her headaches and anxiety.  She used to take 50 mg in the past.  Continue Wellbutrin XL 300 mg daily, Ativan 1 mg twice a day, trazodone 100 mg at bedtime.  Discussed if medicine makes her too sleepy and groggy then she should reduce the trazodone to half tablet.  Discussed safety concern that any time having active suicidal thoughts or homicidal thought and she need to call 911 or go to local emergency room.  Encourage healthy lifestyle and watch her calorie intake.  Follow-up in 3 months.     Kathlee Nations, MD 10/15/2018, 3:55 PM

## 2018-10-18 ENCOUNTER — Encounter: Payer: Self-pay | Admitting: Internal Medicine

## 2018-10-18 ENCOUNTER — Telehealth: Payer: Self-pay | Admitting: Internal Medicine

## 2018-10-18 ENCOUNTER — Ambulatory Visit (HOSPITAL_COMMUNITY): Payer: Self-pay | Admitting: Psychiatry

## 2018-10-18 ENCOUNTER — Ambulatory Visit (INDEPENDENT_AMBULATORY_CARE_PROVIDER_SITE_OTHER): Payer: 59 | Admitting: Internal Medicine

## 2018-10-18 VITALS — BP 110/64 | HR 84 | Ht 65.5 in | Wt 211.2 lb

## 2018-10-18 DIAGNOSIS — K30 Functional dyspepsia: Secondary | ICD-10-CM | POA: Diagnosis not present

## 2018-10-18 DIAGNOSIS — K219 Gastro-esophageal reflux disease without esophagitis: Secondary | ICD-10-CM | POA: Diagnosis not present

## 2018-10-18 MED ORDER — DEXLANSOPRAZOLE 60 MG PO CPDR: 60.0000 mg | DELAYED_RELEASE_CAPSULE | Freq: Every day | ORAL | 3 refills | Status: DC

## 2018-10-18 MED ORDER — GABAPENTIN 300 MG PO CAPS: 300.0000 mg | ORAL_CAPSULE | Freq: Every day | ORAL | 11 refills | Status: DC

## 2018-10-18 NOTE — Progress Notes (Signed)
Sandra Miller 43 y.o. [birth year] [id number]  Assessment & Plan:   Encounter Diagnoses  Name Primary?  . Functional dyspepsia Yes  . Gastroesophageal reflux disease, esophagitis presence not specified      We will change omeprazole 40 mg twice daily to Dexilant 60 mg daily.  Hopefully she will be able to purchase this.  If not we will have to try a different PPI.  It looks like increasing gabapentin is not an option and Carafate has not worked well in the past.  With her psychiatric treatment makes it hard to add neuro modulating agents.    Subjective:   Chief Complaint: Abdominal pain reflux dyspepsia  HPI Sandra Miller is here complaining that her abdominal burning pain has returned.  She cannot tell me exactly when, she was seen last year in May and was doing well on a regimen of daily gabapentin and twice daily omeprazole.  Her psychiatrist did not want her to take gabapentin 3 times daily so she has not been able to do that, I think there was some concern over drug interaction.  She was recently started on Topamax, lisinopril has been started lately as well.  Her symptoms precede that though.  Wt Readings from Last 3 Encounters:  10/18/18 211 lb 4 oz (95.8 kg)  10/09/18 213 lb 14.4 oz (97 kg)  10/01/18 210 lb 4.8 oz (95.4 kg)      Allergies  Allergen Reactions  . Depakote [Divalproex Sodium] Nausea And Vomiting  . Minocycline Hives  . Aspirin Hives    States able to tolerate Goody Powders without any problem except for GI upset   Current Meds  Medication Sig  . acetaminophen (TYLENOL) 500 MG tablet Take 1,000 mg by mouth every 6 (six) hours as needed for moderate pain or fever.  Marland Kitchen buPROPion (WELLBUTRIN XL) 300 MG 24 hr tablet Take 1 tablet (300 mg total) by mouth every morning.  . calcium-vitamin D (OSCAL WITH D) 500-200 MG-UNIT tablet Take 1 tablet by mouth.  . Cyanocobalamin (VITAMIN B-12) 1000 MCG SUBL Place 1,000 mcg under the tongue 3 (three) times a week.  .  docusate sodium (COLACE) 100 MG capsule Take 1 capsule (100 mg total) by mouth 2 (two) times daily. (Patient taking differently: Take 100 mg by mouth daily as needed for mild constipation. )  . gabapentin (NEURONTIN) 300 MG capsule Take 1 capsule (300 mg total) by mouth daily.  Marland Kitchen lisinopril-hydrochlorothiazide (ZESTORETIC) 20-12.5 MG tablet Take 1 tablet by mouth daily.  Marland Kitchen LORazepam (ATIVAN) 1 MG tablet Take 1 tablet (1 mg total) by mouth 2 (two) times daily.  . Multiple Vitamin (MULTIVITAMIN) tablet Take 1 tablet by mouth daily.  Marland Kitchen OLANZapine (ZYPREXA) 15 MG tablet Take 1 tablet (15 mg total) by mouth at bedtime.  . Probiotic Product (PROBIOTIC-10) CHEW Chew 1 each by mouth daily.  Marland Kitchen topiramate (TOPAMAX) 25 MG tablet Take 1 tablet (25 mg total) by mouth at bedtime.  . traZODone (DESYREL) 100 MG tablet Take 1 tablet (100 mg total) by mouth at bedtime.  . valACYclovir (VALTREX) 500 MG tablet Take 1 tablet (500 mg total) by mouth daily.  . [DISCONTINUED] gabapentin (NEURONTIN) 300 MG capsule TAKE ONE CAPSULE BY MOUTH DAILY  . [DISCONTINUED] omeprazole (PRILOSEC) 40 MG capsule Take 1 capsule (40 mg total) by mouth 2 (two) times daily.   Past Medical History:  Diagnosis Date  . Bipolar 1 disorder ([place])   . Complication of anesthesia    wakes up during procedures  .  GAD (generalized anxiety disorder)   . Genital HSV    currently per pt  no break out 03-22-2016   . GERD (gastroesophageal reflux disease)   . Hiatal hernia   . History of cervical dysplasia    2012 laser ablation  . History of esophageal dilatation    for dysphasia -- x2 dilated  . History of gastric ulcer   . History of Helicobacter pylori infection    remote hx  . History of hidradenitis suppurativa    "gets all over body intermittantly"    . History of hypertension    no issue since stopped drinking alcohol 2014  . History of kidney stones   . History of panic attacks   . Iron deficiency anemia   . Left ureteral stone    . OCD (obsessive compulsive disorder)   . PTSD (post-traumatic stress disorder)   . Recovering alcoholic in remission ([place])    since 2014  . RLS (restless legs syndrome)   . Smokers' cough ([place])   . Urgency of urination   . Yeast infection involving the vagina and surrounding area    secondary to taking antibiotic   Past Surgical History:  Procedure Laterality Date  . CESAREAN SECTION  1995   w/  Bilateral Tubal Ligation  . COLONOSCOPY  last one 08-09-2013  . CYSTOSCOPY W/ URETERAL STENT PLACEMENT Left 03/29/2016   Procedure: CYSTOSCOPY WITH STENT REPLACEMENT;  Surgeon: Nickie Retort, MD;  Location: [facility];  Service: Urology;  Laterality: Left;  . CYSTOSCOPY WITH RETROGRADE PYELOGRAM, URETEROSCOPY AND STENT PLACEMENT Left 03/08/2016   Procedure: CYSTOSCOPY WITH  LEFT RETROGRADE PYELOGRAM, AND STENT PLACEMENT;  Surgeon: Nickie Retort, MD;  Location: [facility];  Service: Urology;  Laterality: Left;  . CYSTOSCOPY/RETROGRADE/URETEROSCOPY/STONE EXTRACTION WITH BASKET Left 03/29/2016   Procedure: CYSTOSCOPY/RETROGRADE/URETEROSCOPY/STONE EXTRACTION WITH BASKET;  Surgeon: Nickie Retort, MD;  Location[facility];  Service: Urology;  Laterality: Left;  . ENDOMETRIAL ABLATION W/ NOVASURE  04-01-2010  . ESOPHAGOGASTRODUODENOSCOPY  last one 08-09-2013  . KNEE ARTHROSCOPY Left as teen  . LASER ABLATION OF THE CERVIX  2012 approx  . TRANSTHORACIC ECHOCARDIOGRAM  05-19-2006   lvsf normal, ef 55-65%, there was mild flattening of the interventricular septum during diastoli/  RV size at upper limits normal  . TUBAL LIGATION    . WISDOM TOOTH EXTRACTION  age 20 's   Social History   Social History Narrative   Former Sales promotion account executive patient.      Was on disability at one point.   Return to the workforce.  40 hours a week at [employer], 10 hours a week on the weekends at [employer] in [place] at night 10 to 12 hours a week.      Has  grown children, she lives alone with a pet, continues to smoke no alcohol or drug use at this time      History of EtOH abuse and THC use.         family history includes ADD / ADHD in her brother; Alcohol abuse in her brother and father; Anxiety disorder in her maternal aunt and mother; Cirrhosis in her cousin; Colon cancer in her brother; Depression in her maternal aunt and mother; Diabetes in her maternal grandfather and paternal grandmother; Drug abuse in her brother and brother; Heart disease in her father and mother; Kidney disease in her maternal uncle; Lung cancer in her father; Stomach cancer in her paternal grandfather.   Review of  Systems As per HPI  Objective:   Physical Exam BP 110/64 (BP Location: Left Arm, Patient Position: Sitting, Cuff Size: Normal)   Pulse 84   Ht 5' 5.5" (1.664 m)   Wt 211 lb 4 oz (95.8 kg)   BMI 34.62 kg/m  Well-developed well-nourished no acute distress The eyes are anicteric The abdomen is soft benign and nontender She has an appropriate mood and affect

## 2018-10-18 NOTE — Patient Instructions (Addendum)
  We have sent the following medications to your pharmacy for you to pick up at your convenience: Dexilant to replace your omeprazole, and also gabapentin    I appreciate the opportunity to care for you. Silvano Rusk, MD, [facility]

## 2018-10-18 NOTE — Telephone Encounter (Signed)
Even with the instant savings card the Dexilant was going to be $59 which is more than she can afford. Her omeprazole was $26 a month. Please advise which of the ones on her formulary you would recommend Sir?

## 2018-10-18 NOTE — Telephone Encounter (Signed)
Pt reported that dexilant is too expensive.  Pt requested a cheaper alt.

## 2018-10-18 NOTE — Telephone Encounter (Signed)
It was going to be $113 for her Dexilant. I told her to call and see what her insurance covers in that drug class and she will call us back.

## 2018-10-18 NOTE — Telephone Encounter (Signed)
Pt said her insurance will cover the Pantoprazole, Omeprazole, Rabeprazole

## 2018-10-19 MED ORDER — PANTOPRAZOLE SODIUM 40 MG PO TBEC: 40.0000 mg | DELAYED_RELEASE_TABLET | Freq: Two times a day (BID) | ORAL | 3 refills | Status: DC

## 2018-10-19 NOTE — Telephone Encounter (Signed)
Try pantoprazole 40 mg bid

## 2018-10-19 NOTE — Telephone Encounter (Signed)
I called and left [place] a detailed message about sending this in.

## 2018-10-22 ENCOUNTER — Other Ambulatory Visit: Payer: Self-pay | Admitting: Internal Medicine

## 2018-10-26 ENCOUNTER — Telehealth (HOSPITAL_COMMUNITY): Payer: Self-pay

## 2018-10-26 NOTE — Telephone Encounter (Signed)
Patient is calling to see if she can get a letter from [place] stateing that she has to work from home due to her anxiety. Please review and advise, thank you

## 2018-10-26 NOTE — Telephone Encounter (Signed)
Yes she can have a letter.

## 2018-11-02 ENCOUNTER — Encounter (HOSPITAL_COMMUNITY): Payer: Self-pay

## 2018-11-02 ENCOUNTER — Telehealth: Payer: Self-pay | Admitting: Internal Medicine

## 2018-11-02 MED ORDER — OMEPRAZOLE 40 MG PO CPDR: 40.0000 mg | DELAYED_RELEASE_CAPSULE | Freq: Two times a day (BID) | ORAL | 1 refills | Status: DC

## 2018-11-02 NOTE — Telephone Encounter (Signed)
Left a detailed message that new rx was sent in as requested to the [facility] on [address]

## 2018-11-02 NOTE — Telephone Encounter (Signed)
She can go back to the omeprazole as before please rx x 6 months 40 mg bid # 180 w/ 1 refill

## 2018-11-02 NOTE — Telephone Encounter (Signed)
Pt called in stated that the pantoprazole (PROTONIX) 40 MG tablet [[id number]]  Is not working and if the doctor can put her back on the omeprazole.

## 2018-11-02 NOTE — Telephone Encounter (Signed)
Please advise Sir? 

## 2018-11-06 ENCOUNTER — Ambulatory Visit
Admission: RE | Admit: 2018-11-06 | Discharge: 2018-11-06 | Disposition: A | Discharge status: Home / Self Care | Payer: 59 | Source: Ambulatory Visit | Attending: Obstetrics and Gynecology | Admitting: Obstetrics and Gynecology

## 2018-11-06 DIAGNOSIS — Z01419 Encounter for gynecological examination (general) (routine) without abnormal findings: Secondary | ICD-10-CM

## 2018-11-08 ENCOUNTER — Encounter: Payer: Self-pay | Admitting: Internal Medicine

## 2018-12-27 ENCOUNTER — Telehealth (INDEPENDENT_AMBULATORY_CARE_PROVIDER_SITE_OTHER): Payer: 59 | Admitting: Family Medicine

## 2018-12-27 ENCOUNTER — Other Ambulatory Visit: Payer: Self-pay

## 2018-12-27 DIAGNOSIS — J302 Other seasonal allergic rhinitis: Secondary | ICD-10-CM | POA: Insufficient documentation

## 2018-12-27 DIAGNOSIS — R112 Nausea with vomiting, unspecified: Secondary | ICD-10-CM | POA: Diagnosis not present

## 2018-12-27 MED ORDER — PROMETHAZINE HCL 25 MG PO TABS: 25.0000 mg | ORAL_TABLET | Freq: Three times a day (TID) | ORAL | 0 refills | Status: DC | PRN

## 2018-12-27 NOTE — Progress Notes (Signed)
Telemedicine Encounter- SOAP NOTE Established Patient  I discussed the limitations, risks, security and privacy concerns of performing an evaluation and management service by telephone and the availability of in person appointments. I also discussed with the patient that there may be a patient responsible charge related to this service. The patient expressed understanding and agreed to proceed.  This telephone encounter was conducted with the patient's verbal consent via audio telecommunications: yes Patient was instructed to have this encounter in a suitably private space; and to only have persons present to whom they give permission to participate. In addition, patient identity was confirmed by use of name plus two identifiers (DOB and address).  I spent a total of 48min talking with the patient or their proxy.  Tested for COVID-9 and feels sick. Test negative.   Nausa and Diarrhea and health at work from [employer] [facility] has taken her out of work this past weekend.   She has been feeling this way for about a week now.             Subjective   Sandra Miller is a 43 y.o. female established patient. Telephone visit today for nausea, vomiting, diarrhea.  HPI Pt has been sick since last Sat. Pt states onset allergy symptoms, nausea, and diarrhea. Pt states health at work-tested COVID negative. Pt states since she does feel well they recommended a evisit. Emetrol otc tried for nausea-not effective Imodium-otc taken 4 pills-last dose 2am-continued watery stools Pt with temp last week 99.5-currently afebrile No  h/o allergies in the past-currently with congestion and drainage Cough, itch in throat, sneezing-pt takes her dog out  only-environmental exposure Benadryl  25mg   Purchased but not taken  Today-pt with continued nausea-episodic- but consistent over past few days-pt took daughter's Zofran-helped symptoms but they now have-reoccurred-unable to eat at all today.  Last  meal-toast/bananas-4pm yesterday No vomiting -pt awoke at 2am with gaging , no vomiting.  Pt able to drink water.   Diarrhea-watery-2x day-no abdominal pain associated Pt states anxiety causes her to panic when she feels nauseated since she is afraid she will vomit. Pt works at [employer] on the weekends and for [place] during the week   Patient Active Problem List   Diagnosis Date Noted  . History of ELISA positive for HSV 10/01/2018  . Restless leg syndrome 04/01/2016  . Nephrolithiasis 03/08/2016  . Obstructive pyelonephritis 03/08/2016  . Normocytic anemia 03/08/2016  . Healthcare maintenance 12/24/2015  . Hidradenitis suppurativa of left axilla 11/03/2015  . Loss of weight 09/08/2015  . Neck pain on left side 10/22/2014  . Genital warts 05/20/2014  . GERD (gastroesophageal reflux disease) 12/26/2012  . Tobacco abuse 08/22/2012  . H/O: HTN (hypertension) 03/19/2009  . Herpes simplex virus (HSV) infection 02/03/2007  . DISORDER, BIPOLAR NOS 02/03/2007    Past Medical History:  Diagnosis Date  . Bipolar 1 disorder ([place])   . Complication of anesthesia    wakes up during procedures  . GAD (generalized anxiety disorder)   . Genital HSV    currently per pt  no break out 03-22-2016   . GERD (gastroesophageal reflux disease)   . Hiatal hernia   . History of cervical dysplasia    2012 laser ablation  . History of esophageal dilatation    for dysphasia -- x2 dilated  . History of gastric ulcer   . History of Helicobacter pylori infection    remote hx  . History of hidradenitis suppurativa    "gets all  over body intermittantly"    . History of hypertension    no issue since stopped drinking alcohol 2014  . History of kidney stones   . History of panic attacks   . Iron deficiency anemia   . Left ureteral stone   . OCD (obsessive compulsive disorder)   . PTSD (post-traumatic stress disorder)   . Recovering alcoholic in remission ([place])    since 2014  . RLS (restless legs  syndrome)   . Smokers' cough ([place])   . Urgency of urination   . Yeast infection involving the vagina and surrounding area    secondary to taking antibiotic    Current Outpatient Medications  Medication Sig Dispense Refill  . acetaminophen (TYLENOL) 500 MG tablet Take 1,000 mg by mouth every 6 (six) hours as needed for moderate pain or fever.    Marland Kitchen buPROPion (WELLBUTRIN XL) 300 MG 24 hr tablet Take 1 tablet (300 mg total) by mouth every morning. 90 tablet 0  . calcium-vitamin D (OSCAL WITH D) 500-200 MG-UNIT tablet Take 1 tablet by mouth.    . Cyanocobalamin (VITAMIN B-12) 1000 MCG SUBL Place 1,000 mcg under the tongue 3 (three) times a week.    . docusate sodium (COLACE) 100 MG capsule Take 1 capsule (100 mg total) by mouth 2 (two) times daily. (Patient taking differently: Take 100 mg by mouth daily as needed for mild constipation. ) 10 capsule 0  . gabapentin (NEURONTIN) 300 MG capsule Take 1 capsule (300 mg total) by mouth daily. 30 capsule 11  . lisinopril-hydrochlorothiazide (ZESTORETIC) 20-12.5 MG tablet Take 1 tablet by mouth daily. 90 tablet 1  . LORazepam (ATIVAN) 1 MG tablet Take 1 tablet (1 mg total) by mouth 2 (two) times daily. 60 tablet 2  . Multiple Vitamin (MULTIVITAMIN) tablet Take 1 tablet by mouth daily.    Marland Kitchen OLANZapine (ZYPREXA) 15 MG tablet Take 1 tablet (15 mg total) by mouth at bedtime. 90 tablet 0  . omeprazole (PRILOSEC) 40 MG capsule Take 1 capsule (40 mg total) by mouth 2 (two) times daily before a meal. 180 capsule 1  . topiramate (TOPAMAX) 25 MG tablet Take 1 tablet (25 mg total) by mouth at bedtime. 90 tablet 0  . traZODone (DESYREL) 100 MG tablet Take 1 tablet (100 mg total) by mouth at bedtime. 90 tablet 0  . valACYclovir (VALTREX) 500 MG tablet Take 1 tablet (500 mg total) by mouth daily. 90 tablet 3   No current facility-administered medications for this visit.     Allergies  Allergen Reactions  . Depakote [Divalproex Sodium] Nausea And Vomiting  .  Minocycline Hives  . Aspirin Hives    States able to tolerate Goody Powders without any problem except for GI upset    Social History   Socioeconomic History  . Marital status: Single    Spouse name: Not on file  . Number of children: Not on file  . Years of education: Not on file  . Highest education level: Not on file  Occupational History  . Not on file  Social Needs  . Financial resource strain: Not on file  . Food insecurity:    Worry: Never true    Inability: Never true  . Transportation needs:    Medical: No    Non-medical: No  Tobacco Use  . Smoking status: Current Every Day Smoker    Packs/day: 1.00    Years: 22.00    Pack years: 22.00    Types: Cigarettes  . Smokeless tobacco:  Never Used  . Tobacco comment: Recently started a smoking cessation class.   Substance and Sexual Activity  . Alcohol use: Not Currently    Alcohol/week: 0.0 standard drinks    Comment:  hx alcohollism  in remission since 2014  . Drug use: No    Comment: hx  marijuana use  . Sexual activity: Yes    Partners: Male    Birth control/protection: Surgical  Lifestyle  . Physical activity:    Days per week: Not on file    Minutes per session: Not on file  . Stress: Not on file  Relationships  . Social connections:    Talks on phone: Not on file    Gets together: Not on file    Attends religious service: Not on file    Active member of club or organization: Not on file    Attends meetings of clubs or organizations: Not on file    Relationship status: Not on file  . Intimate partner violence:    Fear of current or ex partner: Not on file    Emotionally abused: Not on file    Physically abused: Not on file    Forced sexual activity: Not on file  Other Topics Concern  . Not on file  Social History Narrative   Former [facility] patient.      Was on disability at one point.   Return to the workforce.  40 hours a week at [employer], 10 hours a week on the weekends at [employer] in [place] at night 10 to 12 hours a week.      Has grown children, she lives alone with a pet, continues to smoke no alcohol or drug use at this time      History of EtOH abuse and THC use.          ROS CONSTITUTIONAL: no fever, or night sweats  EENT: no sinus problems, nasal congestion-clear drainage, no sore throat CV: no chest pain RESP: no SOB, dry cough GI: diarrhea GU: no pain with urination NEURO:frequent headaches PSY: Insomnia, depression, anxiety, mood swings  Objective   Vitals as reported by the patient: none 1. Nausea and vomiting, intractability of vomiting not specified, unspecified vomiting type Phenergan-rx, BRAT diet, slowly advance-off work this weekend from [place]. Return to work next week-d/w pt concern for drowsiness 2. AR-pt purchased benadryl I discussed the assessment and treatment plan with the patient. The patient was provided an opportunity to ask questions and all were answered. The patient agreed with the plan and demonstrated an understanding of the instructions.   The patient was advised to call back or seek an in-person evaluation if the symptoms worsen or if the condition fails to improve as anticipated.  I provided 64minutes of non-face-to-face time during this encounter.  LISA Hannah Beat, MD  [facility] 12-27-18

## 2018-12-27 NOTE — Progress Notes (Signed)
Tested for COVID-9 and feels sick. Test negative.   Nausa and Diarrhea and health at work from [employer] [facility] has taken her out of work this past weekend.   She has been feeling this way for about a week now.

## 2018-12-29 ENCOUNTER — Other Ambulatory Visit: Payer: Self-pay | Admitting: Family Medicine

## 2019-01-07 ENCOUNTER — Ambulatory Visit: Payer: 59 | Admitting: Family Medicine

## 2019-01-14 ENCOUNTER — Other Ambulatory Visit: Payer: Self-pay

## 2019-01-14 ENCOUNTER — Ambulatory Visit (INDEPENDENT_AMBULATORY_CARE_PROVIDER_SITE_OTHER): Payer: 59 | Admitting: Psychiatry

## 2019-01-14 ENCOUNTER — Encounter (HOSPITAL_COMMUNITY): Payer: Self-pay | Admitting: Psychiatry

## 2019-01-14 DIAGNOSIS — F411 Generalized anxiety disorder: Secondary | ICD-10-CM | POA: Diagnosis not present

## 2019-01-14 DIAGNOSIS — F319 Bipolar disorder, unspecified: Secondary | ICD-10-CM

## 2019-01-14 MED ORDER — LORAZEPAM 1 MG PO TABS: 1.0000 mg | ORAL_TABLET | Freq: Two times a day (BID) | ORAL | 2 refills | Status: DC

## 2019-01-14 MED ORDER — TOPIRAMATE 25 MG PO TABS: 25.0000 mg | ORAL_TABLET | Freq: Every day | ORAL | 0 refills | Status: DC

## 2019-01-14 MED ORDER — OLANZAPINE 15 MG PO TABS: 15.0000 mg | ORAL_TABLET | Freq: Every day | ORAL | 0 refills | Status: DC

## 2019-01-14 MED ORDER — BUPROPION HCL ER (XL) 300 MG PO TB24: 300.0000 mg | ORAL_TABLET | ORAL | 0 refills | Status: DC

## 2019-01-14 MED ORDER — TRAZODONE HCL 100 MG PO TABS: 100.0000 mg | ORAL_TABLET | Freq: Every day | ORAL | 0 refills | Status: DC

## 2019-01-14 NOTE — Progress Notes (Signed)
Virtual Visit via Telephone Note  I connected with [place] on 01/14/19 at  4:20 PM EDT by telephone and verified that I am speaking with the correct person using two identifiers.   I discussed the limitations, risks, security and privacy concerns of performing an evaluation and management service by telephone and the availability of in person appointments. I also discussed with the patient that there may be a patient responsible charge related to this service. The patient expressed understanding and agreed to proceed.   History of Present Illness: Patient was evaluated through phone session.  She is experiencing increased anxiety and nervousness.  On her last visit we recommend to try olanzapine 50 mg but she never filled the prescription and stay on 10 mg olanzapine.  Patient is working 2 jobs.  She is working more than 60 hours a week.  She works 40 hours for [employer] from home and then works on the weekends at [employer].  She is very concerned and afraid from COVID-19.  She does not leave the house unnecessary and she had stopped watching news and social media.  She lives by herself and sometimes children come and stop by to visit her.  She admitted anxiety and overwhelmed but denies any paranoia or hallucination.  She denies any highs and lows or any poor impulse control.  We also started Topamax on the last visit and she is feeling better as does not have any headaches.  She is taking trazodone and Ativan together with Topamax which helps her sleep.  She denies any drinking or using any illegal substances.  We also recommended to see Beth but due to feeling scared does not leave the house.  I explained she can do virtual therapy and she agree on it.  Past Psychiatric History: Reviewed. H/O bipolar disorder, alcohol dependence and marijuana abuse. H/O at least 6 psychiatric hospitalization. Tried lithium, Seroquel, Lamictal, Paxil, Celexa, Abilify, Geodon,Depakote, Neurontin and  Cymbalta. No H/o history of suicidal attempt.    Observations/Objective: Mental status examination done on the phone.  Patient described her mood anxious and nervous.  Her speech is slow but clear and coherent.  Her thought process logical and goal-directed.  There were no delusions, paranoia or grandiosity.  Her attention concentration is okay.  She denies any auditory or visual hallucination, suicidal thoughts or homicidal thought.  She reported no tremors, shakes or any EPS.  She is alert and oriented x3.  Her cognition is good.  Her fund of knowledge is average.  Her insight judgment is okay.  Assessment and Plan: Bipolar disorder type I.  Generalized anxiety disorder.  One more time I encouraged that she should take the olanzapine 15 mg at bedtime and encouraged to see therapist for counseling.  We also discussed she may need to cut down her job as sometimes she gets overwhelmed when she works 60 hours a week.  She is thinking about giving up her [employer] job since going outside make her very nervous.  Discussed medication side effects and benefits.  Continue Wellbutrin XL 300 mg daily, Ativan 1 mg twice a day, trazodone 100 mg at bedtime, Topamax 50 mg at bedtime and she will try olanzapine 15 mg at bedtime.  I recommended to call us back if she has any question or any concern.  We will schedule appointment to see therapist in our office.  Follow-up in 3 months.  Follow Up Instructions:    I discussed the assessment and treatment plan  with the patient. The patient was provided an opportunity to ask questions and all were answered. The patient agreed with the plan and demonstrated an understanding of the instructions.   The patient was advised to call back or seek an in-person evaluation if the symptoms worsen or if the condition fails to improve as anticipated.  I provided 20 minutes of non-face-to-face time during this encounter.   Kathlee Nations, MD

## 2019-04-05 ENCOUNTER — Encounter (HOSPITAL_COMMUNITY): Payer: Self-pay | Admitting: Psychiatry

## 2019-04-05 ENCOUNTER — Other Ambulatory Visit: Payer: Self-pay

## 2019-04-05 ENCOUNTER — Ambulatory Visit (INDEPENDENT_AMBULATORY_CARE_PROVIDER_SITE_OTHER): Payer: 59 | Admitting: Psychiatry

## 2019-04-05 DIAGNOSIS — F411 Generalized anxiety disorder: Secondary | ICD-10-CM | POA: Diagnosis not present

## 2019-04-05 DIAGNOSIS — F319 Bipolar disorder, unspecified: Secondary | ICD-10-CM | POA: Diagnosis not present

## 2019-04-05 MED ORDER — LORAZEPAM 1 MG PO TABS: 1.0000 mg | ORAL_TABLET | Freq: Two times a day (BID) | ORAL | 2 refills | Status: DC

## 2019-04-05 MED ORDER — OLANZAPINE 15 MG PO TABS: 15.0000 mg | ORAL_TABLET | Freq: Every day | ORAL | 0 refills | Status: DC

## 2019-04-05 MED ORDER — TRAZODONE HCL 100 MG PO TABS: 100.0000 mg | ORAL_TABLET | Freq: Every day | ORAL | 0 refills | Status: DC

## 2019-04-05 MED ORDER — BUPROPION HCL ER (XL) 300 MG PO TB24: 300.0000 mg | ORAL_TABLET | ORAL | 0 refills | Status: DC

## 2019-04-05 MED ORDER — TOPIRAMATE 25 MG PO TABS: 25.0000 mg | ORAL_TABLET | Freq: Every day | ORAL | 0 refills | Status: DC

## 2019-04-05 NOTE — Progress Notes (Signed)
Virtual Visit via Telephone Note  I connected with Sandra Miller on 04/05/19 at  9:40 AM EDT by telephone and verified that I am speaking with the correct person using two identifiers.   I discussed the limitations, risks, security and privacy concerns of performing an evaluation and management service by telephone and the availability of in person appointments. I also discussed with the patient that there may be a patient responsible charge related to this service. The patient expressed understanding and agreed to proceed.   History of Present Illness: Patient was evaluated through phone session.  She admitted feeling sad because she lost her job at [employer].  She was working 40 hours.  But recently hold department laid off.  She is actively looking for a job within the [employer].  She is still working 10 hours on the weekend at [employer].  She feels some time tired with lack of motivation to do things.  On her last visit we increase Zyprexa that helped her anxiety sleep and mood but since she laid off she is sleeping back into depression.  She understands it is situational because she is hoping to get a job soon and she had 2 interviews few days ago.  Her family came to visit her and she is babysitting her granddaughter that is also helping her and keeping her busy.  She denies any irritability, anger, hallucination or any severe mood swings.  She is taking her medication as prescribed.  She has headaches which she believe due to stress as not having a job but she denies any insomnia or any suicidal thoughts.  She denies drinking or using any illegal substances.  She is also worried about pandemic does not leave the house unless it is important.  She did not schedule therapy appointment which was recommended on the last visit.  Due to insurance and lack of job she is not sure if she can start therapy.  Her appetite is okay.  She reported her weight is stable.   Past Psychiatric  History:Reviewed. H/Obipolar disorder, alcohol dependence and marijuana abuse. H/Oat least 6 psychiatric hospitalization. Triedlithium, Seroquel, Lamictal, Paxil, Celexa, Abilify, Geodon,Depakote, Neurontin and Cymbalta. No H/ohistory of suicidal attempt.   Psychiatric Specialty Exam: Physical Exam  ROS  There were no vitals taken for this visit.There is no height or weight on file to calculate BMI.  General Appearance: NA  Eye Contact:  NA  Speech:  Slow  Volume:  Decreased  Mood:  Dysphoric  Affect:  NA  Thought Process:  Goal Directed  Orientation:  Full (Time, Place, and Person)  Thought Content:  Rumination  Suicidal Thoughts:  No  Homicidal Thoughts:  No  Memory:  Immediate;   Good Recent;   Good Remote;   Good  Judgement:  Good  Insight:  Good  Psychomotor Activity:  Decreased  Concentration:  Concentration: Fair and Attention Span: Fair  Recall:  Good  Fund of Knowledge:  Good  Language:  Good  Akathisia:  No  Handed:  Right  AIMS (if indicated):     Assets:  Communication Skills Desire for Improvement Housing Resilience Social Support  ADL's:  Intact  Cognition:  WNL  Sleep:         Assessment and Plan: Bipolar disorder type I.  Generalized anxiety disorder.  Reassurance given.  She has been doing fine on her medication until she noticed some sadness due to loss of job.  She understand it is situational and hoping to get  job soon.  She has not reviewed few days ago.  She does not want to change medication.  Her only concern is lack of insurance and she like to use good Rx coupon.  I recommend that we will send her prescription to [facility] however if she find cheaper medication then she can call us to resend to other pharmacy.  Discussed medication side effects and benefits.  Continue trazodone 100 mg at bedtime, Topamax 25 mg at bedtime, Ativan 1 mg twice a day, Wellbutrin XL 300 mg daily and olanzapine 15 mg at bedtime.  Discussed medication side  effects and benefits.  Recommended to call us back if she has any question or any concern.  Follow-up in 3 months. Due to insurance reason she is not interested in therapy.  Follow Up Instructions:    I discussed the assessment and treatment plan with the patient. The patient was provided an opportunity to ask questions and all were answered. The patient agreed with the plan and demonstrated an understanding of the instructions.   The patient was advised to call back or seek an in-person evaluation if the symptoms worsen or if the condition fails to improve as anticipated.  I provided 20 minutes of non-face-to-face time during this encounter.   Kathlee Nations, MD

## 2019-04-18 ENCOUNTER — Other Ambulatory Visit: Payer: Self-pay | Admitting: Internal Medicine

## 2019-04-18 ENCOUNTER — Ambulatory Visit (HOSPITAL_COMMUNITY): Payer: Self-pay | Admitting: Psychiatry

## 2019-04-22 ENCOUNTER — Encounter: Payer: Self-pay | Admitting: Registered Nurse

## 2019-04-22 ENCOUNTER — Telehealth (INDEPENDENT_AMBULATORY_CARE_PROVIDER_SITE_OTHER): Payer: Self-pay | Admitting: Registered Nurse

## 2019-04-22 ENCOUNTER — Other Ambulatory Visit: Payer: Self-pay

## 2019-04-22 DIAGNOSIS — A09 Infectious gastroenteritis and colitis, unspecified: Secondary | ICD-10-CM

## 2019-04-22 MED ORDER — ONDANSETRON HCL 4 MG PO TABS: 4.0000 mg | ORAL_TABLET | Freq: Two times a day (BID) | ORAL | 0 refills | Status: DC | PRN

## 2019-04-22 NOTE — Progress Notes (Signed)
Has ben having nausea and vomiting since Saturday. Has not been to work due to the sx. Pt is required by supervisor to have a note before she returns, She is and employee of [employer]. She says the vomiting is starting to subside a bit and she is having loose stool. Covid test is needed due to the sx of the pt. She is requesting a refill on the phenergran. Pharmacy has been verified

## 2019-04-22 NOTE — Progress Notes (Signed)
Telemedicine Encounter- SOAP NOTE Established Patient  This telephone encounter was conducted with the patient's (or proxy's) verbal consent via audio telecommunications: yes   Patient was instructed to have this encounter in a suitably private space; and to only have persons present to whom they give permission to participate. In addition, patient identity was confirmed by use of name plus two identifiers (DOB and address).  I discussed the limitations, risks, security and privacy concerns of performing an evaluation and management service by telephone and the availability of in person appointments. I also discussed with the patient that there may be a patient responsible charge related to this service. The patient expressed understanding and agreed to proceed.  I spent a total of 13 minutes talking with the patient or their proxy.  No chief complaint on file.   Subjective   Sandra Miller is a 43 y.o. established patient. Telephone visit today for  HPI Starting Saturday morning pt has experienced NVD. She states that she ate mussels on Friday night, which she normally does not eat seafood. No one else ate from her meal. No sick contacts. Denies headache, cough, fever, chills, SHOB, chest pain, myalgias, changes to taste or smell. She states that she last time she vomited was around 12 hours prior to today's visit, and that she is continuing to have some loose stools despite taking OTC anti-diarrheal medication. Otherwise, feels like she's improving, but as a [employer] employee, she needs clearance to return to work. While this is not likely [employer] dictates that employees must be 48 hr diarrhea and vomiting free before returning.   Patient Active Problem List   Diagnosis Date Noted  . Nausea and vomiting 12/27/2018  . Seasonal allergic rhinitis 12/27/2018  . History of ELISA positive for HSV 10/01/2018  . Restless leg syndrome 04/01/2016  . Nephrolithiasis 03/08/2016  .  Obstructive pyelonephritis 03/08/2016  . Normocytic anemia 03/08/2016  . Healthcare maintenance 12/24/2015  . Hidradenitis suppurativa of left axilla 11/03/2015  . Loss of weight 09/08/2015  . Neck pain on left side 10/22/2014  . Genital warts 05/20/2014  . GERD (gastroesophageal reflux disease) 12/26/2012  . Tobacco abuse 08/22/2012  . H/O: HTN (hypertension) 03/19/2009  . Herpes simplex virus (HSV) infection 02/03/2007  . DISORDER, BIPOLAR NOS 02/03/2007    Past Medical History:  Diagnosis Date  . Bipolar 1 disorder ([place])   . Complication of anesthesia    wakes up during procedures  . GAD (generalized anxiety disorder)   . Genital HSV    currently per pt  no break out 03-22-2016   . GERD (gastroesophageal reflux disease)   . Hiatal hernia   . History of cervical dysplasia    2012 laser ablation  . History of esophageal dilatation    for dysphasia -- x2 dilated  . History of gastric ulcer   . History of Helicobacter pylori infection    remote hx  . History of hidradenitis suppurativa    "gets all over body intermittantly"    . History of hypertension    no issue since stopped drinking alcohol 2014  . History of kidney stones   . History of panic attacks   . Iron deficiency anemia   . Left ureteral stone   . OCD (obsessive compulsive disorder)   . PTSD (post-traumatic stress disorder)   . Recovering alcoholic in remission ([place])    since 2014  . RLS (restless legs syndrome)   . Smokers' cough ([place])   .  Urgency of urination   . Yeast infection involving the vagina and surrounding area    secondary to taking antibiotic    Current Outpatient Medications  Medication Sig Dispense Refill  . acetaminophen (TYLENOL) 500 MG tablet Take 1,000 mg by mouth every 6 (six) hours as needed for moderate pain or fever.    Marland Kitchen buPROPion (WELLBUTRIN XL) 300 MG 24 hr tablet Take 1 tablet (300 mg total) by mouth every morning. 90 tablet 0  . calcium-vitamin D (OSCAL WITH D) 500-200  MG-UNIT tablet Take 1 tablet by mouth.    . Cyanocobalamin (VITAMIN B-12) 1000 MCG SUBL Place 1,000 mcg under the tongue 3 (three) times a week.    . gabapentin (NEURONTIN) 300 MG capsule Take 1 capsule (300 mg total) by mouth daily. 30 capsule 11  . lisinopril-hydrochlorothiazide (ZESTORETIC) 20-12.5 MG tablet TAKE 1 TABLET BY MOUTH DAILY 90 tablet 1  . LORazepam (ATIVAN) 1 MG tablet Take 1 tablet (1 mg total) by mouth 2 (two) times daily. 60 tablet 2  . Multiple Vitamin (MULTIVITAMIN) tablet Take 1 tablet by mouth daily.    Marland Kitchen OLANZapine (ZYPREXA) 15 MG tablet Take 1 tablet (15 mg total) by mouth at bedtime. 90 tablet 0  . omeprazole (PRILOSEC) 40 MG capsule TAKE 1 CAPSULE(40 MG) BY MOUTH TWICE DAILY BEFORE A MEAL 180 capsule 1  . ondansetron (ZOFRAN) 4 MG tablet Take 1 tablet (4 mg total) by mouth 2 (two) times daily as needed for nausea or vomiting. 10 tablet 0  . promethazine (PHENERGAN) 25 MG tablet Take 1 tablet (25 mg total) by mouth every 8 (eight) hours as needed for nausea or vomiting. 20 tablet 0  . topiramate (TOPAMAX) 25 MG tablet Take 1 tablet (25 mg total) by mouth at bedtime. 90 tablet 0  . traZODone (DESYREL) 100 MG tablet Take 1 tablet (100 mg total) by mouth at bedtime. 90 tablet 0  . valACYclovir (VALTREX) 500 MG tablet Take 1 tablet (500 mg total) by mouth daily. 90 tablet 3   No current facility-administered medications for this visit.     Allergies  Allergen Reactions  . Depakote [Divalproex Sodium] Nausea And Vomiting  . Minocycline Hives  . Aspirin Hives    States able to tolerate Goody Powders without any problem except for GI upset    Social History   Socioeconomic History  . Marital status: Single    Spouse name: Not on file  . Number of children: Not on file  . Years of education: Not on file  . Highest education level: Not on file  Occupational History  . Not on file  Social Needs  . Financial resource strain: Not on file  . Food insecurity     Worry: Never true    Inability: Never true  . Transportation needs    Medical: No    Non-medical: No  Tobacco Use  . Smoking status: Current Every Day Smoker    Packs/day: 1.00    Years: 22.00    Pack years: 22.00    Types: Cigarettes  . Smokeless tobacco: Never Used  . Tobacco comment: Recently started a smoking cessation class.   Substance and Sexual Activity  . Alcohol use: Not Currently    Alcohol/week: 0.0 standard drinks    Comment:  hx alcohollism  in remission since 2014  . Drug use: No    Comment: hx  marijuana use  . Sexual activity: Yes    Partners: Male    Birth control/protection: Surgical  Lifestyle  . Physical activity    Days per week: Not on file    Minutes per session: Not on file  . Stress: Not on file  Relationships  . Social Herbalist on phone: Not on file    Gets together: Not on file    Attends religious service: Not on file    Active member of club or organization: Not on file    Attends meetings of clubs or organizations: Not on file    Relationship status: Not on file  . Intimate partner violence    Fear of current or ex partner: Not on file    Emotionally abused: Not on file    Physically abused: Not on file    Forced sexual activity: Not on file  Other Topics Concern  . Not on file  Social History Narrative   Former [facility] patient.      Was on disability at one point.   Return to the workforce.  40 hours a week at [employer], 10 hours a week on the weekends at [employer] in [place] at night 10 to 12 hours a week.      Has grown children, she lives alone with a pet, continues to smoke no alcohol or drug use at this time      History of EtOH abuse and THC use.          Review of Systems  Constitutional: Negative.  Negative for fever, malaise/fatigue and weight loss.  HENT: Negative.   Eyes: Negative.  Negative for blurred vision.  Respiratory: Negative.  Negative for cough and shortness of  breath.   Cardiovascular: Negative.  Negative for chest pain.  Gastrointestinal: Positive for diarrhea, nausea and vomiting. Negative for blood in stool, constipation and melena.  Genitourinary: Negative.   Musculoskeletal: Negative.  Negative for myalgias.  Skin: Negative.   Neurological: Negative.  Negative for dizziness and headaches.  Endo/Heme/Allergies: Negative.   Psychiatric/Behavioral: Negative.   All other systems reviewed and are negative.   Objective   Vitals as reported by the patient: There were no vitals filed for this visit.  Diagnoses and all orders for this visit:  Infectious gastroenteritis -     ondansetron (ZOFRAN) 4 MG tablet; Take 1 tablet (4 mg total) by mouth 2 (two) times daily as needed for nausea or vomiting.   PLAN  Given Zofran for nausea  Otherwise, discussed that this is likely an infectious gastroenteritis resulting from a food borne pathogen and should continue to clear in coming days.  She will contact us 48 hours after symptoms resolve for return to work documentation.  Patient encouraged to call clinic with any questions, comments, or concerns.    I discussed the assessment and treatment plan with the patient. The patient was provided an opportunity to ask questions and all were answered. The patient agreed with the plan and demonstrated an understanding of the instructions.   The patient was advised to call back or seek an in-person evaluation if the symptoms worsen or if the condition fails to improve as anticipated.  I provided 14 minutes of non-face-to-face time during this encounter.  Maximiano Coss, NP  [facility]

## 2019-04-24 ENCOUNTER — Telehealth: Payer: Self-pay | Admitting: Registered Nurse

## 2019-04-24 ENCOUNTER — Other Ambulatory Visit (HOSPITAL_COMMUNITY): Payer: Self-pay | Admitting: Psychiatry

## 2019-04-24 DIAGNOSIS — F411 Generalized anxiety disorder: Secondary | ICD-10-CM

## 2019-04-24 NOTE — Telephone Encounter (Signed)
Pt wants a RTW  Letter stating she was out from 8/15-8/19. She can RTW on 8/20. Please advise pt at [phone]

## 2019-04-24 NOTE — Telephone Encounter (Signed)
Pt would like a RTW letter stating she has been out 8/17-8/19

## 2019-04-25 ENCOUNTER — Telehealth: Payer: Self-pay

## 2019-04-25 NOTE — Telephone Encounter (Signed)
Pt to office requesting RTW note.  Confirms no fever, n/v/d x 48 hours.  Denies any other symptoms.  Work note to RTW 08/21 provided.

## 2019-04-25 NOTE — Telephone Encounter (Signed)
Pt called in to verify if note was ready for pick up. Pt states she needs the note as soon as possible to return to work. CB is [phone]

## 2019-06-17 ENCOUNTER — Other Ambulatory Visit: Payer: Self-pay | Admitting: Family Medicine

## 2019-07-04 ENCOUNTER — Encounter (HOSPITAL_COMMUNITY): Payer: Self-pay | Admitting: Psychiatry

## 2019-07-04 ENCOUNTER — Other Ambulatory Visit: Payer: Self-pay

## 2019-07-04 ENCOUNTER — Ambulatory Visit (INDEPENDENT_AMBULATORY_CARE_PROVIDER_SITE_OTHER): Payer: 59 | Admitting: Psychiatry

## 2019-07-04 DIAGNOSIS — F319 Bipolar disorder, unspecified: Secondary | ICD-10-CM | POA: Diagnosis not present

## 2019-07-04 DIAGNOSIS — F411 Generalized anxiety disorder: Secondary | ICD-10-CM | POA: Diagnosis not present

## 2019-07-04 MED ORDER — TOPIRAMATE 25 MG PO TABS: 25.0000 mg | ORAL_TABLET | Freq: Every day | ORAL | 0 refills | Status: DC

## 2019-07-04 MED ORDER — BUPROPION HCL ER (XL) 300 MG PO TB24: 300.0000 mg | ORAL_TABLET | ORAL | 0 refills | Status: DC

## 2019-07-04 MED ORDER — LORAZEPAM 1 MG PO TABS: 1.0000 mg | ORAL_TABLET | Freq: Two times a day (BID) | ORAL | 2 refills | Status: DC

## 2019-07-04 MED ORDER — TRAZODONE HCL 100 MG PO TABS: 100.0000 mg | ORAL_TABLET | Freq: Every day | ORAL | 0 refills | Status: DC

## 2019-07-04 MED ORDER — OLANZAPINE 15 MG PO TABS: 15.0000 mg | ORAL_TABLET | Freq: Every day | ORAL | 0 refills | Status: DC

## 2019-07-04 NOTE — Progress Notes (Signed)
Virtual Visit via Telephone Note  I connected with [place] on 07/04/19 at 11:40 AM EDT by telephone and verified that I am speaking with the correct person using two identifiers.   I discussed the limitations, risks, security and privacy concerns of performing an evaluation and management service by telephone and the availability of in person appointments. I also discussed with the patient that there may be a patient responsible charge related to this service. The patient expressed understanding and agreed to proceed.   History of Present Illness: Patient was evaluated by phone session.  She remained under stress because not able to find a second part-time job.  Even though she is working more hours at [employer] but is still not able to make more money and concerned about her finances.  Now she is moving to a bigger place because her mother who has a lot of health issues going to move then with her.  Her mother was living before with her sister but now she has to take the responsibilities.  She is also concerned about jury duty.  We have written a letter to exempt but it is only good for a few weeks and may need a letter so she can be exempted permanently.  Patient told they may assigned her for jury duty on [place] and she is very worried.  She does not even drive on the highways and it makes her very nervous and anxious.  She is taking all her medication as prescribed.  She still have headaches but they are not as bad.  She has no tremors, shakes or any EPS.  She admitted some irritability but denies any anger, violence or any homicidal thoughts or suicidal thoughts.  She is hoping once she finds a second job things will be fine.  She is not drinking or using any illegal substances.  She admitted weight gain as she is not doing exercise.  Past Psychiatric History:Reviewed. H/Obipolar disorder, alcohol dependence and marijuana abuse. H/Oat least 6  psychiatric hospitalization. Triedlithium, Seroquel, Lamictal, Paxil, Celexa, Abilify, Geodon,Depakote, Neurontin and Cymbalta. No H/ohistory of suicidal attempt.    Psychiatric Specialty Exam: Physical Exam  ROS  There were no vitals taken for this visit.There is no height or weight on file to calculate BMI.  General Appearance: NA  Eye Contact:  NA  Speech:  Normal Rate  Volume:  Normal  Mood:  Anxious, Dysphoric and Irritable  Affect:  NA  Thought Process:  Goal Directed  Orientation:  Full (Time, Place, and Person)  Thought Content:  Rumination  Suicidal Thoughts:  No  Homicidal Thoughts:  No  Memory:  Immediate;   Good Recent;   Good Remote;   Good  Judgement:  Good  Insight:  Good  Psychomotor Activity:  NA  Concentration:  Concentration: Fair and Attention Span: Fair  Recall:  Good  Fund of Knowledge:  Good  Language:  Good  Akathisia:  No  Handed:  Right  AIMS (if indicated):     Assets:  Communication Skills Desire for Improvement Resilience  ADL's:  Intact  Cognition:  WNL  Sleep:   ok      Assessment and Plan: Bipolar disorder type I.  Generalized anxiety disorder.  Discussed her current medication.  Patient reluctant to go up on the medication since she feels her depression irritability anxiety is situational.  She will need a letter so she can be examined from jury duty permanently as patient cannot  drive long distance and on the highway.  She also have a lot of anxiety and irritability and may not fit for jury duty.  Discussed medication side effects and benefits.  Continue trazodone 100 mg at bedtime, Topamax 25 mg at bedtime, Ativan 1 mg twice a day, Wellbutrin XL 3 mg daily and olanzapine 15 mg at bedtime.  Recommended to call us back if she has any question or any concern.  Follow-up in 3 months.  Patient is not interested in therapy as she wants to have her finances under control.  Follow Up Instructions:    I discussed the assessment and  treatment plan with the patient. The patient was provided an opportunity to ask questions and all were answered. The patient agreed with the plan and demonstrated an understanding of the instructions.   The patient was advised to call back or seek an in-person evaluation if the symptoms worsen or if the condition fails to improve as anticipated.  I provided 20 minutes of non-face-to-face time during this encounter.   Kathlee Nations, MD

## 2019-07-08 ENCOUNTER — Encounter (HOSPITAL_COMMUNITY): Payer: Self-pay

## 2019-08-16 ENCOUNTER — Other Ambulatory Visit: Payer: Self-pay

## 2019-08-16 ENCOUNTER — Encounter (HOSPITAL_COMMUNITY): Payer: Self-pay

## 2019-08-16 ENCOUNTER — Ambulatory Visit (HOSPITAL_COMMUNITY)
Admission: EM | Admit: 2019-08-16 | Discharge: 2019-08-16 | Disposition: A | Discharge status: Home / Self Care | Payer: Self-pay | Attending: Family Medicine | Admitting: Family Medicine

## 2019-08-16 DIAGNOSIS — L723 Sebaceous cyst: Secondary | ICD-10-CM

## 2019-08-16 MED ORDER — IBUPROFEN 800 MG PO TABS
ORAL_TABLET | ORAL | Status: AC
  Filled 2019-08-16: qty 1

## 2019-08-16 MED ORDER — MUPIROCIN CALCIUM 2 % EX CREA: 1.0000 "application " | TOPICAL_CREAM | Freq: Two times a day (BID) | CUTANEOUS | 0 refills | Status: DC

## 2019-08-16 MED ORDER — IBUPROFEN 800 MG PO TABS
800.0000 mg | ORAL_TABLET | Freq: Once | ORAL | Status: AC
  Administered 2019-08-16: 10:00:00 800 mg via ORAL

## 2019-08-16 NOTE — ED Provider Notes (Signed)
[place]    CSN: [id number] Arrival date & time: 08/16/19  [id number]      History   Chief Complaint Chief Complaint  Patient presents with  . Cyst    HPI Sandra Miller is a 43 y.o. female.   Patient is a 43 year old female with past medical history of hidradenitis presents today with infected cyst to left facial area lateral to the eye.  This is been constant and worsening over the past day.  Tenderness, swelling.  Denies any drainage.  Has been doing warm compresses.  Took Tylenol without any relief.  Was seen at the dermatologist a few months ago and had the cyst removed.  No fevers, chills, body aches or night sweats.  ROS per HPI      Past Medical History:  Diagnosis Date  . Bipolar 1 disorder ([place])   . Complication of anesthesia    wakes up during procedures  . GAD (generalized anxiety disorder)   . Genital HSV    currently per pt  no break out 03-22-2016   . GERD (gastroesophageal reflux disease)   . Hiatal hernia   . History of cervical dysplasia    2012 laser ablation  . History of esophageal dilatation    for dysphasia -- x2 dilated  . History of gastric ulcer   . History of Helicobacter pylori infection    remote hx  . History of hidradenitis suppurativa    "gets all over body intermittantly"    . History of hypertension    no issue since stopped drinking alcohol 2014  . History of kidney stones   . History of panic attacks   . Iron deficiency anemia   . Left ureteral stone   . OCD (obsessive compulsive disorder)   . PTSD (post-traumatic stress disorder)   . Recovering alcoholic in remission ([place])    since 2014  . RLS (restless legs syndrome)   . Smokers' cough ([place])   . Urgency of urination   . Yeast infection involving the vagina and surrounding area    secondary to taking antibiotic    Patient Active Problem List   Diagnosis Date Noted  . Nausea and vomiting 12/27/2018  . Seasonal allergic rhinitis 12/27/2018  . History of  ELISA positive for HSV 10/01/2018  . Restless leg syndrome 04/01/2016  . Nephrolithiasis 03/08/2016  . Obstructive pyelonephritis 03/08/2016  . Normocytic anemia 03/08/2016  . Healthcare maintenance 12/24/2015  . Hidradenitis suppurativa of left axilla 11/03/2015  . Loss of weight 09/08/2015  . Neck pain on left side 10/22/2014  . Genital warts 05/20/2014  . GERD (gastroesophageal reflux disease) 12/26/2012  . Tobacco abuse 08/22/2012  . H/O: HTN (hypertension) 03/19/2009  . Herpes simplex virus (HSV) infection 02/03/2007  . DISORDER, BIPOLAR NOS 02/03/2007    Past Surgical History:  Procedure Laterality Date  . CESAREAN SECTION  1995   w/  Bilateral Tubal Ligation  . COLONOSCOPY  last one 08-09-2013  . CYSTOSCOPY W/ URETERAL STENT PLACEMENT Left 03/29/2016   Procedure: CYSTOSCOPY WITH STENT REPLACEMENT;  Surgeon: Nickie Retort, MD;  Location: [facility];  Service: Urology;  Laterality: Left;  . CYSTOSCOPY WITH RETROGRADE PYELOGRAM, URETEROSCOPY AND STENT PLACEMENT Left 03/08/2016   Procedure: CYSTOSCOPY WITH  LEFT RETROGRADE PYELOGRAM, AND STENT PLACEMENT;  Surgeon: Nickie Retort, MD;  Location: [facility];  Service: Urology;  Laterality: Left;  . CYSTOSCOPY/RETROGRADE/URETEROSCOPY/STONE EXTRACTION WITH BASKET Left 03/29/2016   Procedure: CYSTOSCOPY/RETROGRADE/URETEROSCOPY/STONE EXTRACTION WITH BASKET;  Surgeon: Horald Pollen  Pilar Jarvis, MD;  Location: [facility];  Service: Urology;  Laterality: Left;  . ENDOMETRIAL ABLATION W/ NOVASURE  04-01-2010  . ESOPHAGOGASTRODUODENOSCOPY  last one 08-09-2013  . KNEE ARTHROSCOPY Left as teen  . LASER ABLATION OF THE CERVIX  2012 approx  . TRANSTHORACIC ECHOCARDIOGRAM  05-19-2006   lvsf normal, ef 55-65%, there was mild flattening of the interventricular septum during diastoli/  RV size at upper limits normal  . TUBAL LIGATION    . WISDOM TOOTH EXTRACTION  age 89 's    OB History    Gravida  2   Para  2    Term  2   Preterm      AB      Living  2     SAB      TAB      Ectopic      Multiple      Live Births  2            Home Medications    Prior to Admission medications   Medication Sig Start Date End Date Taking? Authorizing Provider  acetaminophen (TYLENOL) 500 MG tablet Take 1,000 mg by mouth every 6 (six) hours as needed for moderate pain or fever.    [provider]  buPROPion (WELLBUTRIN XL) 300 MG 24 hr tablet Take 1 tablet (300 mg total) by mouth every morning. 07/04/19 07/03/20  Arfeen, Arlyce Harman, MD  calcium-vitamin D (OSCAL WITH D) 500-200 MG-UNIT tablet Take 1 tablet by mouth.    [provider]  Cyanocobalamin (VITAMIN B-12) 1000 MCG SUBL Place 1,000 mcg under the tongue 3 (three) times a week.    [provider]  gabapentin (NEURONTIN) 300 MG capsule Take 1 capsule (300 mg total) by mouth daily. 10/18/18   Gatha Mayer, MD  lisinopril-hydrochlorothiazide (ZESTORETIC) 20-12.5 MG tablet TAKE 1 TABLET BY MOUTH DAILY 06/17/19   Rutherford Guys, MD  LORazepam (ATIVAN) 1 MG tablet Take 1 tablet (1 mg total) by mouth 2 (two) times daily. 07/04/19   Arfeen, Arlyce Harman, MD  Multiple Vitamin (MULTIVITAMIN) tablet Take 1 tablet by mouth daily.    [provider]  mupirocin cream (BACTROBAN) 2 % Apply 1 application topically 2 (two) times daily. 08/16/19   Mychael Smock, Tressia Miners A, NP  OLANZapine (ZYPREXA) 15 MG tablet Take 1 tablet (15 mg total) by mouth at bedtime. 07/04/19 07/03/20  Arfeen, Arlyce Harman, MD  omeprazole (PRILOSEC) 40 MG capsule TAKE 1 CAPSULE(40 MG) BY MOUTH TWICE DAILY BEFORE A MEAL 04/18/19   Gatha Mayer, MD  ondansetron (ZOFRAN) 4 MG tablet Take 1 tablet (4 mg total) by mouth 2 (two) times daily as needed for nausea or vomiting. 04/22/19   Maximiano Coss, NP  promethazine (PHENERGAN) 25 MG tablet Take 1 tablet (25 mg total) by mouth every 8 (eight) hours as needed for nausea or vomiting. 12/27/18   Corum, Rex Kras, MD  topiramate  (TOPAMAX) 25 MG tablet Take 1 tablet (25 mg total) by mouth at bedtime. 07/04/19 07/03/20  Arfeen, Arlyce Harman, MD  traZODone (DESYREL) 100 MG tablet Take 1 tablet (100 mg total) by mouth at bedtime. 07/04/19   Arfeen, Arlyce Harman, MD  valACYclovir (VALTREX) 500 MG tablet Take 1 tablet (500 mg total) by mouth daily. 10/01/18   Chancy Milroy, MD    Family History Family History  Problem Relation Age of Onset  . Heart disease Father   . Lung cancer Father   . Alcohol abuse  Father   . Heart disease Mother   . Depression Mother   . Anxiety disorder Mother   . Drug abuse Brother   . Alcohol abuse Brother   . Drug abuse Brother   . ADD / ADHD Brother   . Colon cancer Brother        malignant polyp < 60 in 1  . Stomach cancer Paternal Grandfather   . Diabetes Maternal Grandfather   . Diabetes Paternal Grandmother   . Kidney disease Maternal Uncle   . Cirrhosis Cousin        alcoholic  . Anxiety disorder Maternal Aunt   . Depression Maternal Aunt     Social History Social History   Tobacco Use  . Smoking status: Current Every Day Smoker    Packs/day: 1.00    Years: 22.00    Pack years: 22.00    Types: Cigarettes  . Smokeless tobacco: Never Used  . Tobacco comment: Recently started a smoking cessation class.   Substance Use Topics  . Alcohol use: Not Currently    Alcohol/week: 0.0 standard drinks    Comment:  hx alcohollism  in remission since 2014  . Drug use: No    Comment: hx  marijuana use     Allergies   Depakote [divalproex sodium], Minocycline, and Aspirin   Review of Systems Review of Systems   Physical Exam Triage Vital Signs ED Triage Vitals  Enc Vitals Group     BP 08/16/19 0858 134/82     Pulse --      Resp 08/16/19 0858 16     Temp 08/16/19 0858 98.7 F (37.1 C)     Temp Source 08/16/19 0858 Oral     SpO2 08/16/19 0858 98 %     Weight --      Height --      Head Circumference --      Peak Flow --      Pain Score 08/16/19 0856 8     Pain Loc --       Pain Edu? --      Excl. in [place]? --    No data found.  Updated Vital Signs BP 134/82 (BP Location: Left Arm)   Temp 98.7 F (37.1 C) (Oral)   Resp 16   SpO2 98%   Visual Acuity Right Eye Distance:   Left Eye Distance:   Bilateral Distance:    Right Eye Near:   Left Eye Near:    Bilateral Near:     Physical Exam Vitals and nursing note reviewed.  Constitutional:      General: She is not in acute distress.    Appearance: Normal appearance. She is not ill-appearing, toxic-appearing or diaphoretic.  HENT:     Head: Normocephalic.      Comments: Approximated half centimeter infected sebaceous cyst to left facial area Very fluctuant without any surrounding induration or erythema. No eye swelling    Nose: Nose normal.     Mouth/Throat:     Pharynx: Oropharynx is clear.  Eyes:     Conjunctiva/sclera: Conjunctivae normal.  Pulmonary:     Effort: Pulmonary effort is normal.  Musculoskeletal:        General: Normal range of motion.     Cervical back: Normal range of motion.  Skin:    General: Skin is warm and dry.     Findings: No rash.  Neurological:     Mental Status: She is alert.  Psychiatric:  Mood and Affect: Mood normal.      UC Treatments / Results  Labs (all labs ordered are listed, but only abnormal results are displayed) Labs Reviewed - No data to display  EKG   Radiology No results found.  Procedures Incision and Drainage  Date/Time: 08/16/2019 11:37 AM Performed by: Orvan July, NP Authorized by: Orvan July, NP   Consent:    Consent obtained:  Verbal   Consent given by:  Patient   Risks discussed:  Bleeding, pain and incomplete drainage Location:    Type:  Cyst   Location:  Head   Head location:  Face Pre-procedure details:    Skin preparation:  Betadine Anesthesia (see MAR for exact dosages):    Anesthesia method:  Topical application   Topical anesthesia: freeze spray. Procedure type:    Complexity:  Simple Procedure  details:    Needle aspiration: no     Incision types:  Stab incision   Scalpel blade:  11   Drainage:  Purulent   Drainage amount:  Moderate   Wound treatment:  Wound left open   Packing materials:  None Post-procedure details:    Patient tolerance of procedure:  Tolerated well, no immediate complications   (including critical care time)  Medications Ordered in UC Medications  ibuprofen (ADVIL) tablet 800 mg (800 mg Oral Given 08/16/19 0944)    Initial Impression / Assessment and Plan / UC Course  I have reviewed the triage vital signs and the nursing notes.  Pertinent labs & imaging results that were available during my care of the patient were reviewed by me and considered in my medical decision making (see chart for details).     Infected sebaceous cyst-I&D here in clinic with mod amount of drainage with cheesy like substance. Patient tolerated well.  Will do topical antibiotics and have her follow with dermatology. Ibuprofen given here for pain Final Clinical Impressions(s) / UC Diagnoses   Final diagnoses:  Sebaceous cyst     Discharge Instructions     We will do Bactroban cream to the infected cyst area.  I do not think you need oral antibiotics at this time Ibuprofen for pain given here. Be careful taking more ibuprofen with your history of gastric ulcers.  Follow up with dermatologist.     ED Prescriptions    Medication Sig Dispense Auth. Provider   mupirocin cream (BACTROBAN) 2 % Apply 1 application topically 2 (two) times daily. 15 g Loura Halt A, NP     PDMP not reviewed this encounter.   Orvan July, NP 08/16/19 1139

## 2019-08-16 NOTE — Discharge Instructions (Addendum)
We will do Bactroban cream to the infected cyst area.  I do not think you need oral antibiotics at this time Ibuprofen for pain given here. Be careful taking more ibuprofen with your history of gastric ulcers.  Follow up with dermatologist.

## 2019-08-16 NOTE — ED Triage Notes (Signed)
Pt presents to the UC with a painful cyst x 1 day. Pt states the cyst was removed couple months ago with the Dermatologist and overnight the cyst started swelling.

## 2019-09-13 ENCOUNTER — Telehealth (INDEPENDENT_AMBULATORY_CARE_PROVIDER_SITE_OTHER): Payer: Self-pay | Admitting: Family Medicine

## 2019-09-13 ENCOUNTER — Other Ambulatory Visit: Payer: Self-pay

## 2019-09-13 DIAGNOSIS — Z20822 Contact with and (suspected) exposure to covid-19: Secondary | ICD-10-CM

## 2019-09-13 DIAGNOSIS — R110 Nausea: Secondary | ICD-10-CM

## 2019-09-13 MED ORDER — PROMETHAZINE HCL 25 MG PO TABS: 25.0000 mg | ORAL_TABLET | Freq: Three times a day (TID) | ORAL | 0 refills | Status: DC | PRN

## 2019-09-13 NOTE — Progress Notes (Signed)
  Pt is having major nausea for the past 3 days, asking for refill on med. That med was sent to the pharmacy. Pt is a [employer] fund[employer]. She is needing clearance to return bk to work. She is scheduled to take covid test today

## 2019-09-13 NOTE — Progress Notes (Signed)
Virtual Visit Note  I connected with patient on 09/13/19 at 230pm by video doximity and verified that I am speaking with the correct person using two identifiers. Sandra Miller is currently located at home and patient is currently with them during visit. The provider, Rutherford Guys, MD is located in their office at time of visit.  I discussed the limitations, risks, security and privacy concerns of performing an evaluation and management service by telephone and the availability of in person appointments. I also discussed with the patient that there may be a patient responsible charge related to this service. The patient expressed understanding and agreed to proceed.   CC: nausea and diarrhea, covid exposure  HPI ? Was exposed to covid patient at work, hospital Out of work since Saturday per work Started to have sx 3 days ago - nausea and diarrhea No vomiting Tolerating PO No fever but having chills Normal sense of taste and smell No URI sx, cough, SOB Has been isolating Got tested today Was able to pick up phenergan Received 1st covid vaccine almost 3 weeks ago   Allergies  Allergen Reactions  . Depakote [Divalproex Sodium] Nausea And Vomiting  . Minocycline Hives  . Aspirin Hives    States able to tolerate Goody Powders without any problem except for GI upset    Prior to Admission medications   Medication Sig Start Date End Date Taking? Authorizing Provider  acetaminophen (TYLENOL) 500 MG tablet Take 1,000 mg by mouth every 6 (six) hours as needed for moderate pain or fever.   Yes [provider]  buPROPion (WELLBUTRIN XL) 300 MG 24 hr tablet Take 1 tablet (300 mg total) by mouth every morning. 07/04/19 07/03/20 Yes Arfeen, Arlyce Harman, MD  calcium-vitamin D (OSCAL WITH D) 500-200 MG-UNIT tablet Take 1 tablet by mouth.   Yes [provider]  Cyanocobalamin (VITAMIN B-12) 1000 MCG SUBL Place 1,000 mcg under the tongue 3 (three) times a week.   Yes [provider]  gabapentin (NEURONTIN) 300 MG capsule Take 1 capsule (300 mg total) by mouth daily. 10/18/18  Yes Gatha Mayer, MD  lisinopril-hydrochlorothiazide (ZESTORETIC) 20-12.5 MG tablet TAKE 1 TABLET BY MOUTH DAILY 06/17/19  Yes Rutherford Guys, MD  LORazepam (ATIVAN) 1 MG tablet Take 1 tablet (1 mg total) by mouth 2 (two) times daily. 07/04/19  Yes Arfeen, Arlyce Harman, MD  Multiple Vitamin (MULTIVITAMIN) tablet Take 1 tablet by mouth daily.   Yes [provider]  mupirocin cream (BACTROBAN) 2 % Apply 1 application topically 2 (two) times daily. 08/16/19  Yes Bast, Traci A, NP  OLANZapine (ZYPREXA) 15 MG tablet Take 1 tablet (15 mg total) by mouth at bedtime. 07/04/19 07/03/20 Yes Arfeen, Arlyce Harman, MD  omeprazole (PRILOSEC) 40 MG capsule TAKE 1 CAPSULE(40 MG) BY MOUTH TWICE DAILY BEFORE A MEAL 04/18/19  Yes Gatha Mayer, MD  ondansetron (ZOFRAN) 4 MG tablet Take 1 tablet (4 mg total) by mouth 2 (two) times daily as needed for nausea or vomiting. 04/22/19  Yes Maximiano Coss, NP  promethazine (PHENERGAN) 25 MG tablet Take 1 tablet (25 mg total) by mouth every 8 (eight) hours as needed for nausea or vomiting. 09/13/19  Yes Rutherford Guys, MD  topiramate (TOPAMAX) 25 MG tablet Take 1 tablet (25 mg total) by mouth at bedtime. 07/04/19 07/03/20 Yes Arfeen, Arlyce Harman, MD  traZODone (DESYREL) 100 MG tablet Take 1 tablet (100 mg total) by mouth at bedtime. 07/04/19  Yes Arfeen, Dossie Der  T, MD  valACYclovir (VALTREX) 500 MG tablet Take 1 tablet (500 mg total) by mouth daily. 10/01/18  Yes Chancy Milroy, MD    Past Medical History:  Diagnosis Date  . Bipolar 1 disorder ([place])   . Complication of anesthesia    wakes up during procedures  . GAD (generalized anxiety disorder)   . Genital HSV    currently per pt  no break out 03-22-2016   . GERD (gastroesophageal reflux disease)   . Hiatal hernia   . History of cervical dysplasia    2012 laser ablation  . History of esophageal dilatation     for dysphasia -- x2 dilated  . History of gastric ulcer   . History of Helicobacter pylori infection    remote hx  . History of hidradenitis suppurativa    "gets all over body intermittantly"    . History of hypertension    no issue since stopped drinking alcohol 2014  . History of kidney stones   . History of panic attacks   . Iron deficiency anemia   . Left ureteral stone   . OCD (obsessive compulsive disorder)   . PTSD (post-traumatic stress disorder)   . Recovering alcoholic in remission ([place])    since 2014  . RLS (restless legs syndrome)   . Smokers' cough ([place])   . Urgency of urination   . Yeast infection involving the vagina and surrounding area    secondary to taking antibiotic    Past Surgical History:  Procedure Laterality Date  . CESAREAN SECTION  1995   w/  Bilateral Tubal Ligation  . COLONOSCOPY  last one 08-09-2013  . CYSTOSCOPY W/ URETERAL STENT PLACEMENT Left 03/29/2016   Procedure: CYSTOSCOPY WITH STENT REPLACEMENT;  Surgeon: Nickie Retort, MD;  Location: [facility];  Service: Urology;  Laterality: Left;  . CYSTOSCOPY WITH RETROGRADE PYELOGRAM, URETEROSCOPY AND STENT PLACEMENT Left 03/08/2016   Procedure: CYSTOSCOPY WITH  LEFT RETROGRADE PYELOGRAM, AND STENT PLACEMENT;  Surgeon: Nickie Retort, MD;  Location: [facility];  Service: Urology;  Laterality: Left;  . CYSTOSCOPY/RETROGRADE/URETEROSCOPY/STONE EXTRACTION WITH BASKET Left 03/29/2016   Procedure: CYSTOSCOPY/RETROGRADE/URETEROSCOPY/STONE EXTRACTION WITH BASKET;  Surgeon: Nickie Retort, MD;  Location: [facility];  Service: Urology;  Laterality: Left;  . ENDOMETRIAL ABLATION W/ NOVASURE  04-01-2010  . ESOPHAGOGASTRODUODENOSCOPY  last one 08-09-2013  . KNEE ARTHROSCOPY Left as teen  . LASER ABLATION OF THE CERVIX  2012 approx  . TRANSTHORACIC ECHOCARDIOGRAM  05-19-2006   lvsf normal, ef 55-65%, there was mild flattening of the interventricular septum during diastoli/  RV  size at upper limits normal  . TUBAL LIGATION    . WISDOM TOOTH EXTRACTION  age 25 's    Social History   Tobacco Use  . Smoking status: Current Every Day Smoker    Packs/day: 1.00    Years: 22.00    Pack years: 22.00    Types: Cigarettes  . Smokeless tobacco: Never Used  . Tobacco comment: Recently started a smoking cessation class.   Substance Use Topics  . Alcohol use: Not Currently    Alcohol/week: 0.0 standard drinks    Comment:  hx alcohollism  in remission since 2014    Family History  Problem Relation Age of Onset  . Heart disease Father   . Lung cancer Father   . Alcohol abuse Father   . Heart disease Mother   . Depression Mother   . Anxiety disorder Mother   . Drug abuse  Brother   . Alcohol abuse Brother   . Drug abuse Brother   . ADD / ADHD Brother   . Colon cancer Brother        malignant polyp < 60 in 1  . Stomach cancer Paternal Grandfather   . Diabetes Maternal Grandfather   . Diabetes Paternal Grandmother   . Kidney disease Maternal Uncle   . Cirrhosis Cousin        alcoholic  . Anxiety disorder Maternal Aunt   . Depression Maternal Aunt     ROS Per hpi  Objective  Vitals as reported by the patient: none  Gen: AAOx3, NAD Lying in bed Speaking in full sentences, breathing comfortably  ASSESSMENT and PLAN  1. Nausea Most likely GI covid, test results pending. Continue supportive measures. Discussed quarantine guidelines and RTC precautions. - promethazine (PHENERGAN) 25 MG tablet; Take 1 tablet (25 mg total) by mouth every 8 (eight) hours as needed for nausea or vomiting.  2. Close exposure to COVID-19 virus  FOLLOW-UP: prn   The above assessment and management plan was discussed with the patient. The patient verbalized understanding of and has agreed to the management plan. Patient is aware to call the clinic if symptoms persist or worsen. Patient is aware when to return to the clinic for a follow-up visit. Patient educated on when it  is appropriate to go to the emergency department.    I provided 9 minutes of non-face-to-face time during this encounter.  Rutherford Guys, MD [facility] at [facility], [place] [address] Ph.  [phone] Fax [phone]

## 2019-09-19 ENCOUNTER — Other Ambulatory Visit: Payer: Self-pay

## 2019-09-19 ENCOUNTER — Encounter (HOSPITAL_COMMUNITY): Payer: Self-pay | Admitting: Psychiatry

## 2019-09-19 ENCOUNTER — Ambulatory Visit (INDEPENDENT_AMBULATORY_CARE_PROVIDER_SITE_OTHER): Payer: Self-pay | Admitting: Psychiatry

## 2019-09-19 DIAGNOSIS — F411 Generalized anxiety disorder: Secondary | ICD-10-CM

## 2019-09-19 DIAGNOSIS — F319 Bipolar disorder, unspecified: Secondary | ICD-10-CM

## 2019-09-19 MED ORDER — BUPROPION HCL ER (XL) 300 MG PO TB24: 300.0000 mg | ORAL_TABLET | ORAL | 0 refills | Status: DC

## 2019-09-19 MED ORDER — LORAZEPAM 1 MG PO TABS: 1.0000 mg | ORAL_TABLET | Freq: Two times a day (BID) | ORAL | 2 refills | Status: DC

## 2019-09-19 MED ORDER — TRAZODONE HCL 100 MG PO TABS: 100.0000 mg | ORAL_TABLET | Freq: Every day | ORAL | 0 refills | Status: DC

## 2019-09-19 MED ORDER — OLANZAPINE 15 MG PO TABS: 15.0000 mg | ORAL_TABLET | Freq: Every day | ORAL | 0 refills | Status: DC

## 2019-09-19 MED ORDER — TOPIRAMATE 25 MG PO TABS: 25.0000 mg | ORAL_TABLET | Freq: Every day | ORAL | 0 refills | Status: DC

## 2019-09-19 MED FILL — TOPIRAMATE 25 MG TABLET: 25 | 90 days supply | Qty: 90 | Fill #0

## 2019-09-19 MED FILL — BUPROPION HCL ER (XL) 300 M: 300 | 30 days supply | Qty: 30 | Fill #0

## 2019-09-19 MED FILL — OLANZapine 15 MG TABS: 15 | 90 days supply | Qty: 90 | Fill #0

## 2019-09-19 MED FILL — LORazepam 1 MG TABS: 1 | 30 days supply | Qty: 60 | Fill #0

## 2019-09-19 NOTE — Progress Notes (Signed)
Virtual Visit via Telephone Note  I connected with Sandra Miller on 09/19/19 at  4:00 PM EST by telephone and verified that I am speaking with the correct person using two identifiers.   I discussed the limitations, risks, security and privacy concerns of performing an evaluation and management service by telephone and the availability of in person appointments. I also discussed with the patient that there may be a patient responsible charge related to this service. The patient expressed understanding and agreed to proceed.   History of Present Illness: Patient was evaluated by phone session.  She is taking her medication and reported no major event since last visit.  She had a quiet Christmas with her mother.  Her mother is now living with her.  She is sleeping good.  She has headaches and sometimes she feels Topamax 25 mg does not help as much.  I have recommended to see neurology but due to Covid she is not able to make appointment.  Overall she feels the current medicine is working.  She has no tremors, shakes or any EPS.  Recently her job hours are increased at [employer] and she is eligible for benefits.  She is happy about it but is still thinking about adding few hours to get as a second job as she had 2 days off.  She denies any irritability, anger, mania, suicidal thoughts.  Due to Covid she does not go outside.  She did not visit her children on Christmas because of the Covid scare.  We have given letter to exempt from jury duty and so far patient has not here from jury duty.  She does not want to do because [place] and she does not feel comfortable driving on the highway.  Patient like to keep her current medication.  She feels the medicine doing the job and she promised if headaches do not get better then she will contact neurology.  She is not interested in therapy.   Past Psychiatric History:Reviewed. H/Obipolar disorder,  alcohol dependence and marijuana abuse. H/Oat least 6 psychiatric hospitalization. Triedlithium, Seroquel, Lamictal, Paxil, Celexa, Abilify, Geodon,Depakote, Neurontin and Cymbalta. No H/ohistory of suicidal attempt.   Psychiatric Specialty Exam: Physical Exam  Review of Systems  There were no vitals taken for this visit.There is no height or weight on file to calculate BMI.  General Appearance: NA  Eye Contact:  NA  Speech:  Clear and Coherent and Normal Rate  Volume:  Normal  Mood:  Euthymic  Affect:  NA  Thought Process:  Goal Directed  Orientation:  Full (Time, Place, and Person)  Thought Content:  WDL and Logical  Suicidal Thoughts:  No  Homicidal Thoughts:  No  Memory:  Immediate;   Good Recent;   Good Remote;   Good  Judgement:  Intact  Insight:  Present  Psychomotor Activity:  NA  Concentration:  Concentration: Fair and Attention Span: Fair  Recall:  Good  Fund of Knowledge:  Good  Language:  Good  Akathisia:  No  Handed:  Right  AIMS (if indicated):     Assets:  Communication Skills Desire for Improvement Housing Resilience Social Support  ADL's:  Intact  Cognition:  WNL  Sleep:   ok      Assessment and Plan: Bipolar disorder type I.  Generalized anxiety disorder.  Patient is fairly stable on her current medication.  She feels her anxiety and mood is stable on her current medication.  Continue Topamax 25 mg at bedtime, Ativan 1 mg twice a day, Wellbutrin XL 300 mg daily, olanzapine 15 mg at bedtime and trazodone 100 mg at bedtime.  Recommended to call us back if any question of any concern.  Will consider blood work on her next appointment.  Follow-up in 3 months.  Follow Up Instructions:    I discussed the assessment and treatment plan with the patient. The patient was provided an opportunity to ask questions and all were answered. The patient agreed with the plan and demonstrated an understanding of the instructions.   The patient was advised to  call back or seek an in-person evaluation if the symptoms worsen or if the condition fails to improve as anticipated.  I provided 20 minutes of non-face-to-face time during this encounter.   Kathlee Nations, MD

## 2019-10-15 ENCOUNTER — Other Ambulatory Visit: Payer: Self-pay | Admitting: Internal Medicine

## 2019-10-15 NOTE — Telephone Encounter (Signed)
Refill each x 2 and ask her to set up f/u visit

## 2019-10-15 NOTE — Telephone Encounter (Signed)
I spoke with Sandra Miller and she has to have a referral from her PCP so she will call and get that going to get an appointment. Medicines will be refilled.

## 2019-10-15 NOTE — Telephone Encounter (Signed)
May I refill the gabapentin?

## 2019-10-26 MED FILL — BuPROPion HCL ER (XL) 300 M: 300 | 30 days supply | Qty: 30 | Fill #1

## 2019-10-26 MED FILL — LISINOPRIL-HCTZ 20-12.5 MG: 20-12.5 | 60 days supply | Qty: 60 | Fill #0

## 2019-10-26 MED FILL — OMEPRAZOLE 40 MG CPDR: 40 | 90 days supply | Qty: 180 | Fill #0

## 2019-10-26 MED FILL — LORazepam 1 MG TABS: 1 | 30 days supply | Qty: 60 | Fill #1

## 2019-10-26 MED FILL — GABAPENTIN 300 MG CAPSULE: 300 | 30 days supply | Qty: 30 | Fill #0

## 2019-10-31 ENCOUNTER — Other Ambulatory Visit: Payer: Self-pay

## 2019-10-31 ENCOUNTER — Ambulatory Visit (INDEPENDENT_AMBULATORY_CARE_PROVIDER_SITE_OTHER): Payer: No Typology Code available for payment source | Admitting: Family Medicine

## 2019-10-31 ENCOUNTER — Encounter: Payer: Self-pay | Admitting: Family Medicine

## 2019-10-31 VITALS — BP 135/82 | HR 71 | Temp 98.2°F | Resp 18 | Ht 65.5 in | Wt 220.0 lb

## 2019-10-31 DIAGNOSIS — B009 Herpesviral infection, unspecified: Secondary | ICD-10-CM

## 2019-10-31 DIAGNOSIS — I10 Essential (primary) hypertension: Secondary | ICD-10-CM

## 2019-10-31 DIAGNOSIS — K219 Gastro-esophageal reflux disease without esophagitis: Secondary | ICD-10-CM

## 2019-10-31 DIAGNOSIS — Z1231 Encounter for screening mammogram for malignant neoplasm of breast: Secondary | ICD-10-CM

## 2019-10-31 DIAGNOSIS — R87810 Cervical high risk human papillomavirus (HPV) DNA test positive: Secondary | ICD-10-CM

## 2019-10-31 MED ORDER — LISINOPRIL-HYDROCHLOROTHIAZIDE 20-12.5 MG PO TABS: 1.0000 | ORAL_TABLET | Freq: Every day | ORAL | 1 refills | Status: DC

## 2019-10-31 MED ORDER — VALACYCLOVIR HCL 500 MG PO TABS: 500.0000 mg | ORAL_TABLET | Freq: Every day | ORAL | 3 refills | Status: DC

## 2019-10-31 MED FILL — VALACYCLOVIR HCL 500 MG TAB: 500 | 90 days supply | Qty: 90 | Fill #0

## 2019-10-31 NOTE — Progress Notes (Signed)
2/25/20213:16 PM  Sandra Miller November 29, 1975, 44 y.o., female [id number]  Chief Complaint  Patient presents with  . Referral    [facility] Dr Carlean Purl required per insurance    HPI:   Patient is a 44 y.o. female with past medical history significant for HTN, HLP, bipolar disorder and GAD  who presents today requesting referrals requested by insurance  Has been seeing Dr Carlean Purl for years for functional dyspepsia, GERD, EGD - esophageal stenosis, ulcers; colonoscopy given fhx colon cancer  She is due for pap, she would like to go back [facility] clinic, Dr Rip Harbour as she has history of abnormal paps requiring cryotherapy and laser treatment  Last pap jan 2020 - neg pap but positive HPV, repeat in 1 year  She is due for her mammogram - breast cancer screening  Had covid vaccines in jan 2021  Depression screen [facility] 2/9 10/31/2019 12/27/2018 10/09/2018  Decreased Interest 0 0 0  Down, Depressed, Hopeless 0 0 0  PHQ - 2 Score 0 0 0  Altered sleeping - - -  Tired, decreased energy - - -  Change in appetite - - -  Feeling bad or failure about yourself  - - -  Trouble concentrating - - -  Moving slowly or fidgety/restless - - -  Suicidal thoughts - - -  PHQ-9 Score - - -  Some encounter information is confidential and restricted. Go to Review Flowsheets activity to see all data.  Some recent data might be hidden    Fall Risk  10/31/2019 12/27/2018 10/09/2018 07/09/2018 07/09/2018  Falls in the past year? 0 0 0 0 0  Number falls in past yr: 0 0 0 - -  Injury with Fall? 0 0 0 - -  Follow up Falls evaluation completed - - - -     Allergies  Allergen Reactions  . Depakote [Divalproex Sodium] Nausea And Vomiting  . Minocycline Hives  . Aspirin Hives    States able to tolerate Goody Powders without any problem except for GI upset    Prior to Admission medications   Medication Sig Start Date End Date Taking? Authorizing Provider  acetaminophen  (TYLENOL) 500 MG tablet Take 1,000 mg by mouth every 6 (six) hours as needed for moderate pain or fever.   Yes [provider]  buPROPion (WELLBUTRIN XL) 300 MG 24 hr tablet Take 1 tablet (300 mg total) by mouth every morning. 09/19/19 09/18/20 Yes Arfeen, Arlyce Harman, MD  gabapentin (NEURONTIN) 300 MG capsule TAKE 1 CAPSULE(300 MG) BY MOUTH DAILY 10/15/19  Yes Gatha Mayer, MD  lisinopril-hydrochlorothiazide (ZESTORETIC) 20-12.5 MG tablet TAKE 1 TABLET BY MOUTH DAILY 06/17/19  Yes Rutherford Guys, MD  LORazepam (ATIVAN) 1 MG tablet Take 1 tablet (1 mg total) by mouth 2 (two) times daily. 09/19/19  Yes Arfeen, Arlyce Harman, MD  Multiple Vitamin (MULTIVITAMIN) tablet Take 1 tablet by mouth daily.   Yes [provider]  OLANZapine (ZYPREXA) 15 MG tablet Take 1 tablet (15 mg total) by mouth at bedtime. 09/19/19 09/18/20 Yes Arfeen, Arlyce Harman, MD  omeprazole (PRILOSEC) 40 MG capsule TAKE 1 CAPSULE(40 MG) BY MOUTH TWICE DAILY BEFORE A MEAL 10/15/19  Yes Gatha Mayer, MD  topiramate (TOPAMAX) 25 MG tablet Take 1 tablet (25 mg total) by mouth at bedtime. 09/19/19 09/18/20 Yes Arfeen, Arlyce Harman, MD  traZODone (DESYREL) 100 MG tablet Take 1 tablet (100 mg total) by mouth at bedtime. 09/19/19  Yes  Arfeen, Arlyce Harman, MD  valACYclovir (VALTREX) 500 MG tablet Take 1 tablet (500 mg total) by mouth daily. 10/01/18  Yes Chancy Milroy, MD  calcium-vitamin D (OSCAL WITH D) 500-200 MG-UNIT tablet Take 1 tablet by mouth.    [provider]  Cyanocobalamin (VITAMIN B-12) 1000 MCG SUBL Place 1,000 mcg under the tongue 3 (three) times a week.    [provider]  mupirocin cream (BACTROBAN) 2 % Apply 1 application topically 2 (two) times daily. Patient not taking: Reported on 10/31/2019 08/16/19   Loura Halt A, NP  ondansetron (ZOFRAN) 4 MG tablet Take 1 tablet (4 mg total) by mouth 2 (two) times daily as needed for nausea or vomiting. Patient not taking: Reported on 10/31/2019 04/22/19   Maximiano Coss, NP    promethazine (PHENERGAN) 25 MG tablet Take 1 tablet (25 mg total) by mouth every 8 (eight) hours as needed for nausea or vomiting. Patient not taking: Reported on 10/31/2019 09/13/19   Rutherford Guys, MD    Past Medical History:  Diagnosis Date  . Bipolar 1 disorder ([place])   . Complication of anesthesia    wakes up during procedures  . GAD (generalized anxiety disorder)   . Genital HSV    currently per pt  no break out 03-22-2016   . GERD (gastroesophageal reflux disease)   . Hiatal hernia   . History of cervical dysplasia    2012 laser ablation  . History of esophageal dilatation    for dysphasia -- x2 dilated  . History of gastric ulcer   . History of Helicobacter pylori infection    remote hx  . History of hidradenitis suppurativa    "gets all over body intermittantly"    . History of hypertension    no issue since stopped drinking alcohol 2014  . History of kidney stones   . History of panic attacks   . Iron deficiency anemia   . Left ureteral stone   . OCD (obsessive compulsive disorder)   . PTSD (post-traumatic stress disorder)   . Recovering alcoholic in remission ([place])    since 2014  . RLS (restless legs syndrome)   . Smokers' cough ([place])   . Urgency of urination   . Yeast infection involving the vagina and surrounding area    secondary to taking antibiotic    Past Surgical History:  Procedure Laterality Date  . CESAREAN SECTION  1995   w/  Bilateral Tubal Ligation  . COLONOSCOPY  last one 08-09-2013  . CYSTOSCOPY W/ URETERAL STENT PLACEMENT Left 03/29/2016   Procedure: CYSTOSCOPY WITH STENT REPLACEMENT;  Surgeon: Nickie Retort, MD;  Location: [facility];  Service: Urology;  Laterality: Left;  . CYSTOSCOPY WITH RETROGRADE PYELOGRAM, URETEROSCOPY AND STENT PLACEMENT Left 03/08/2016   Procedure: CYSTOSCOPY WITH  LEFT RETROGRADE PYELOGRAM, AND STENT PLACEMENT;  Surgeon: Nickie Retort, MD;  Location: [facility];  Service: Urology;  Laterality:  Left;  . CYSTOSCOPY/RETROGRADE/URETEROSCOPY/STONE EXTRACTION WITH BASKET Left 03/29/2016   Procedure: CYSTOSCOPY/RETROGRADE/URETEROSCOPY/STONE EXTRACTION WITH BASKET;  Surgeon: Nickie Retort, MD;  Location: [facility];  Service: Urology;  Laterality: Left;  . ENDOMETRIAL ABLATION W/ NOVASURE  04-01-2010  . ESOPHAGOGASTRODUODENOSCOPY  last one 08-09-2013  . KNEE ARTHROSCOPY Left as teen  . LASER ABLATION OF THE CERVIX  2012 approx  . TRANSTHORACIC ECHOCARDIOGRAM  05-19-2006   lvsf normal, ef 55-65%, there was mild flattening of the interventricular septum during diastoli/  RV size at upper limits normal  .  TUBAL LIGATION    . WISDOM TOOTH EXTRACTION  age 21 's    Social History   Tobacco Use  . Smoking status: Current Every Day Smoker    Packs/day: 1.00    Years: 22.00    Pack years: 22.00    Types: Cigarettes  . Smokeless tobacco: Never Used  . Tobacco comment: Recently started a smoking cessation class.   Substance Use Topics  . Alcohol use: Not Currently    Alcohol/week: 0.0 standard drinks    Comment:  hx alcohollism  in remission since 2014    Family History  Problem Relation Age of Onset  . Heart disease Father   . Lung cancer Father   . Alcohol abuse Father   . Heart disease Mother   . Depression Mother   . Anxiety disorder Mother   . Drug abuse Brother   . Alcohol abuse Brother   . Drug abuse Brother   . ADD / ADHD Brother   . Colon cancer Brother        malignant polyp < 60 in 1  . Stomach cancer Paternal Grandfather   . Diabetes Maternal Grandfather   . Diabetes Paternal Grandmother   . Kidney disease Maternal Uncle   . Cirrhosis Cousin        alcoholic  . Anxiety disorder Maternal Aunt   . Depression Maternal Aunt     ROS Per hpi  OBJECTIVE:  Today's Vitals   10/31/19 1536  BP: 135/82  Pulse: 71  Resp: 18  Temp: 98.2 F (36.8 C)  TempSrc: Temporal  SpO2: 100%  Weight: 220 lb (99.8 kg)  Height: 5' 5.5" (1.664 m)   Body  mass index is 36.05 kg/m.   Physical Exam Vitals and nursing note reviewed.  Constitutional:      Appearance: She is well-developed.  HENT:     Head: Normocephalic and atraumatic.  Eyes:     General: No scleral icterus.    Conjunctiva/sclera: Conjunctivae normal.     Pupils: Pupils are equal, round, and reactive to light.  Pulmonary:     Effort: Pulmonary effort is normal.  Musculoskeletal:     Cervical back: Neck supple.  Skin:    General: Skin is warm and dry.  Neurological:     Mental Status: She is alert and oriented to person, place, and time.     No results found for this or any previous visit (from the past 24 hour(s)).  No results found.   ASSESSMENT and PLAN  1. Herpes simplex virus (HSV) infection Controlled with daily suppressive therapy - valACYclovir (VALTREX) 500 MG tablet; Take 1 tablet (500 mg total) by mouth daily.  2. Essential hypertension Controlled. Continue current regime.   3. Cervical high risk HPV (human papillomavirus) test positive - Ambulatory referral to Obstetrics / Gynecology  4. Gastroesophageal reflux disease, unspecified whether esophagitis present - Ambulatory referral to Gastroenterology  5. Visit for screening mammogram - MM DIGITAL SCREENING BILATERAL; Future  Other orders - lisinopril-hydrochlorothiazide (ZESTORETIC) 20-12.5 MG tablet; Take 1 tablet by mouth daily.  No follow-ups on file.    Rutherford Guys, MD Primary Care at [facility] [address], [place] [address] Ph.  [phone] Fax [phone]

## 2019-10-31 NOTE — Patient Instructions (Addendum)
   Center for [facility] [place], [place] [phone]  If you have lab work done today you will be contacted with your lab results within the next 2 weeks.  If you have not heard from [place] then please contact us. The fastest way to get your results is to register for My Chart.   IF you received an x-ray today, you will receive an invoice from [facility]. Please contact [facility] at [phone] with questions or concerns regarding your invoice.   IF you received labwork today, you will receive an invoice from [facility]. Please contact [facility] at [phone] with questions or concerns regarding your invoice.   Our billing staff will not be able to assist you with questions regarding bills from these companies.  You will be contacted with the lab results as soon as they are available. The fastest way to get your results is to activate your My Chart account. Instructions are located on the last page of this paperwork. If you have not heard from [place] regarding the results in 2 weeks, please contact this office.

## 2019-11-22 MED FILL — LORazepam 1 MG TABS: 1 | 30 days supply | Qty: 60 | Fill #2

## 2019-11-22 MED FILL — GABAPENTIN 300 MG CAPSULE: 300 | 30 days supply | Qty: 30 | Fill #1

## 2019-11-23 MED FILL — buPROPion HCL ER (XL) 300 M: 300 | 30 days supply | Qty: 30 | Fill #2

## 2019-11-29 ENCOUNTER — Encounter: Payer: Self-pay | Admitting: Family Medicine

## 2019-12-05 ENCOUNTER — Ambulatory Visit: Payer: Self-pay | Admitting: Obstetrics and Gynecology

## 2019-12-11 ENCOUNTER — Ambulatory Visit (INDEPENDENT_AMBULATORY_CARE_PROVIDER_SITE_OTHER): Payer: No Typology Code available for payment source | Admitting: Family Medicine

## 2019-12-11 ENCOUNTER — Encounter: Payer: Self-pay | Admitting: Family Medicine

## 2019-12-11 ENCOUNTER — Other Ambulatory Visit (HOSPITAL_COMMUNITY)
Admission: RE | Admit: 2019-12-11 | Discharge: 2019-12-11 | Disposition: A | Discharge status: Home / Self Care | Payer: No Typology Code available for payment source | Source: Ambulatory Visit | Attending: Obstetrics and Gynecology | Admitting: Obstetrics and Gynecology

## 2019-12-11 ENCOUNTER — Other Ambulatory Visit: Payer: Self-pay

## 2019-12-11 VITALS — BP 136/81 | HR 93 | Wt 223.5 lb

## 2019-12-11 DIAGNOSIS — Z1272 Encounter for screening for malignant neoplasm of vagina: Secondary | ICD-10-CM

## 2019-12-11 DIAGNOSIS — B373 Candidiasis of vulva and vagina: Secondary | ICD-10-CM

## 2019-12-11 DIAGNOSIS — Z01419 Encounter for gynecological examination (general) (routine) without abnormal findings: Secondary | ICD-10-CM | POA: Insufficient documentation

## 2019-12-11 DIAGNOSIS — B009 Herpesviral infection, unspecified: Secondary | ICD-10-CM

## 2019-12-11 DIAGNOSIS — Z113 Encounter for screening for infections with a predominantly sexual mode of transmission: Secondary | ICD-10-CM | POA: Diagnosis not present

## 2019-12-11 DIAGNOSIS — Z1211 Encounter for screening for malignant neoplasm of colon: Secondary | ICD-10-CM | POA: Diagnosis not present

## 2019-12-11 DIAGNOSIS — B977 Papillomavirus as the cause of diseases classified elsewhere: Secondary | ICD-10-CM

## 2019-12-11 DIAGNOSIS — Z1231 Encounter for screening mammogram for malignant neoplasm of breast: Secondary | ICD-10-CM

## 2019-12-11 DIAGNOSIS — B3731 Acute candidiasis of vulva and vagina: Secondary | ICD-10-CM

## 2019-12-11 MED ORDER — FLUCONAZOLE 150 MG PO TABS: 150.0000 mg | ORAL_TABLET | ORAL | 0 refills | Status: AC

## 2019-12-11 MED ORDER — VALACYCLOVIR HCL 1 G PO TABS: 1000.0000 mg | ORAL_TABLET | Freq: Every day | ORAL | 0 refills | Status: AC

## 2019-12-11 MED FILL — FLUCONAZOLE 150 MG TABS: 150 | 3 days supply | Qty: 2 | Fill #0

## 2019-12-11 MED FILL — valACYclovir HCL 1 GM TABS: 1 | 14 days supply | Qty: 14 | Fill #0

## 2019-12-11 NOTE — Progress Notes (Signed)
GYNECOLOGY ANNUAL PREVENTATIVE CARE ENCOUNTER NOTE  Subjective:   Sandra Miller is a 44 y.o. G16P2002 female here for a annual gynecologic exam. Current complaints: anxiety for which she follows with psychiatry. She reports that she is having pain with sex, has not had intercourse in 11 months due to pain. Was previously on valtrex suppressive therapy, but had to discontinue due to costs.   Denies abnormal vaginal bleeding, discharge, pelvic pain, or other gynecologic concerns.    Gynecologic History No LMP recorded. Patient has had an ablation. Contraception: condoms Last Pap: 10/01/2018. Results were: normal, +HR HPV Last mammogram: 11/06/2018. Results were: normal, BiRads I Gardisil: not applicable  Obstetric History OB History  Gravida Para Term Preterm AB Living  2 2 2     2   SAB TAB Ectopic Multiple Live Births          2    # Outcome Date GA Lbr Len/2nd Weight Sex Delivery Anes PTL Lv  2 Term 61     CS-LTranv     1 Term 83     Vag-Spont       Past Medical History:  Diagnosis Date  . Bipolar 1 disorder ([place])   . Complication of anesthesia    wakes up during procedures  . GAD (generalized anxiety disorder)   . Genital HSV    currently per pt  no break out 03-22-2016   . GERD (gastroesophageal reflux disease)   . Hiatal hernia   . History of cervical dysplasia    2012 laser ablation  . History of esophageal dilatation    for dysphasia -- x2 dilated  . History of gastric ulcer   . History of Helicobacter pylori infection    remote hx  . History of hidradenitis suppurativa    "gets all over body intermittantly"    . History of hypertension    no issue since stopped drinking alcohol 2014  . History of kidney stones   . History of panic attacks   . Iron deficiency anemia   . Left ureteral stone   . OCD (obsessive compulsive disorder)   . PTSD (post-traumatic stress disorder)   . Recovering alcoholic in remission ([place])    since 2014  . RLS (restless legs  syndrome)   . Smokers' cough ([place])   . Urgency of urination   . Yeast infection involving the vagina and surrounding area    secondary to taking antibiotic    Past Surgical History:  Procedure Laterality Date  . CESAREAN SECTION  1995   w/  Bilateral Tubal Ligation  . COLONOSCOPY  last one 08-09-2013  . CYSTOSCOPY W/ URETERAL STENT PLACEMENT Left 03/29/2016   Procedure: CYSTOSCOPY WITH STENT REPLACEMENT;  Surgeon: Nickie Retort, MD;  Location[facility];  Service: Urology;  Laterality: Left;  . CYSTOSCOPY WITH RETROGRADE PYELOGRAM, URETEROSCOPY AND STENT PLACEMENT Left 03/08/2016   Procedure: CYSTOSCOPY WITH  LEFT RETROGRADE PYELOGRAM, AND STENT PLACEMENT;  Surgeon: Nickie Retort, MD;  [facility];  Service: Urology;  Laterality: Left;  . CYSTOSCOPY/RETROGRADE/URETEROSCOPY/STONE EXTRACTION WITH BASKET Left 03/29/2016   Procedure: CYSTOSCOPY/RETROGRADE/URETEROSCOPY/STONE EXTRACTION WITH BASKET;  Surgeon: Nickie Retort, MD;  Location: [facility];  Service: Urology;  Laterality: Left;  . ENDOMETRIAL ABLATION W/ NOVASURE  04-01-2010  . ESOPHAGOGASTRODUODENOSCOPY  last one 08-09-2013  . KNEE ARTHROSCOPY Left as teen  . LASER ABLATION OF THE CERVIX  2012 approx  . TRANSTHORACIC ECHOCARDIOGRAM  05-19-2006   lvsf normal, ef  55-65%, there was mild flattening of the interventricular septum during diastoli/  RV size at upper limits normal  . TUBAL LIGATION    . WISDOM TOOTH EXTRACTION  age 69 's    Current Outpatient Medications on File Prior to Visit  Medication Sig Dispense Refill  . acetaminophen (TYLENOL) 500 MG tablet Take 1,000 mg by mouth every 6 (six) hours as needed for moderate pain or fever.    Marland Kitchen buPROPion (WELLBUTRIN XL) 300 MG 24 hr tablet Take 1 tablet (300 mg total) by mouth every morning. 90 tablet 0  . gabapentin (NEURONTIN) 300 MG capsule TAKE 1 CAPSULE(300 MG) BY MOUTH DAILY 30 capsule 1  . lisinopril-hydrochlorothiazide  (ZESTORETIC) 20-12.5 MG tablet Take 1 tablet by mouth daily. 90 tablet 1  . LORazepam (ATIVAN) 1 MG tablet Take 1 tablet (1 mg total) by mouth 2 (two) times daily. 60 tablet 2  . Multiple Vitamin (MULTIVITAMIN) tablet Take 1 tablet by mouth daily.    Marland Kitchen OLANZapine (ZYPREXA) 15 MG tablet Take 1 tablet (15 mg total) by mouth at bedtime. 90 tablet 0  . omeprazole (PRILOSEC) 40 MG capsule TAKE 1 CAPSULE(40 MG) BY MOUTH TWICE DAILY BEFORE A MEAL 180 capsule 0  . Probiotic Product (PROBIOTIC DAILY PO) Take by mouth.    . topiramate (TOPAMAX) 25 MG tablet Take 1 tablet (25 mg total) by mouth at bedtime. 90 tablet 0  . traZODone (DESYREL) 100 MG tablet Take 1 tablet (100 mg total) by mouth at bedtime. 90 tablet 0  . calcium-vitamin D (OSCAL WITH D) 500-200 MG-UNIT tablet Take 1 tablet by mouth.    . Cyanocobalamin (VITAMIN B-12) 1000 MCG SUBL Place 1,000 mcg under the tongue 3 (three) times a week.    . mupirocin cream (BACTROBAN) 2 % Apply 1 application topically 2 (two) times daily. (Patient not taking: Reported on 10/31/2019) 15 g 0  . ondansetron (ZOFRAN) 4 MG tablet Take 1 tablet (4 mg total) by mouth 2 (two) times daily as needed for nausea or vomiting. (Patient not taking: Reported on 10/31/2019) 10 tablet 0  . promethazine (PHENERGAN) 25 MG tablet Take 1 tablet (25 mg total) by mouth every 8 (eight) hours as needed for nausea or vomiting. (Patient not taking: Reported on 10/31/2019) 20 tablet 0   No current facility-administered medications on file prior to visit.    Allergies  Allergen Reactions  . Depakote [Divalproex Sodium] Nausea And Vomiting  . Minocycline Hives  . Aspirin Hives    States able to tolerate Goody Powders without any problem except for GI upset    Social History   Socioeconomic History  . Marital status: Single    Spouse name: Not on file  . Number of children: Not on file  . Years of education: Not on file  . Highest education level: Not on file  Occupational History   . Not on file  Tobacco Use  . Smoking status: Current Every Day Smoker    Packs/day: 1.00    Years: 22.00    Pack years: 22.00    Types: Cigarettes  . Smokeless tobacco: Never Used  . Tobacco comment: Recently started a smoking cessation class.   Substance and Sexual Activity  . Alcohol use: Not Currently    Alcohol/week: 0.0 standard drinks    Comment:  hx alcohollism  in remission since 2014  . Drug use: No    Comment: hx  marijuana use  . Sexual activity: Yes    Partners: Male  Birth control/protection: Surgical  Other Topics Concern  . Not on file  Social History Narrative   Former [facility] patient.      Was on disability at one point.   Return to the workforce.  40 hours a week at [employer], 10 hours a week on the weekends at [employer] in [place] at night 10 to 12 hours a week.      Has grown children, she lives alone with a pet, continues to smoke no alcohol or drug use at this time      History of EtOH abuse and THC use.         Social Determinants of Health   Financial Resource Strain:   . Difficulty of Paying Living Expenses:   Food Insecurity:   . Worried About Charity fundraiser in the Last Year:   . Arboriculturist in the Last Year:   Transportation Needs:   . Film/video editor (Medical):   Marland Kitchen Lack of Transportation (Non-Medical):   Physical Activity:   . Days of Exercise per Week:   . Minutes of Exercise per Session:   Stress:   . Feeling of Stress :   Social Connections:   . Frequency of Communication with Friends and Family:   . Frequency of Social Gatherings with Friends and Family:   . Attends Religious Services:   . Active Member of Clubs or Organizations:   . Attends Archivist Meetings:   Marland Kitchen Marital Status:   Intimate Partner Violence:   . Fear of Current or Ex-Partner:   . Emotionally Abused:   Marland Kitchen Physically Abused:   . Sexually Abused:     Family History  Problem Relation Age of Onset  .  Heart disease Father   . Lung cancer Father   . Alcohol abuse Father   . Heart disease Mother   . Depression Mother   . Anxiety disorder Mother   . Drug abuse Brother   . Alcohol abuse Brother   . Drug abuse Brother   . ADD / ADHD Brother   . Colon cancer Brother        malignant polyp < 60 in 1  . Stomach cancer Paternal Grandfather   . Diabetes Maternal Grandfather   . Diabetes Paternal Grandmother   . Kidney disease Maternal Uncle   . Cirrhosis Cousin        alcoholic  . Anxiety disorder Maternal Aunt   . Depression Maternal Aunt     Diet: varied Exercise: reports none  The following portions of the patient's history were reviewed and updated as appropriate: allergies, current medications, past family history, past medical history, past social history, past surgical history and problem list.  Review of Systems Pertinent items are noted in HPI.   Objective:  BP 136/81   Pulse 93   Wt 223 lb 8 oz (101.4 kg)   BMI 36.63 kg/m  CONSTITUTIONAL: Well-developed, well-nourished female in no acute distress.  HENT:  Normocephalic, atraumatic, External right and left ear normal. Oropharynx is clear and moist EYES: Conjunctivae and EOM are normal. Pupils are equal, round, and reactive to light. No scleral icterus.  NECK: Normal range of motion, supple, no masses.  Normal thyroid.  SKIN: Skin is warm and dry. No rash noted. Not diaphoretic. No erythema. No pallor. NEUROLOGIC: Alert and oriented to person, place, and time. Normal reflexes, muscle tone coordination. No cranial nerve deficit noted. PSYCHIATRIC: Normal mood and affect. Normal behavior. Normal  judgment and thought content. CARDIOVASCULAR: Normal heart rate noted, regular rhythm RESPIRATORY: Clear to auscultation bilaterally. Effort and breath sounds normal, no problems with respiration noted. BREASTS: Symmetric in size. No masses, skin changes, nipple drainage, or lymphadenopathy. ABDOMEN: Soft, normal bowel sounds, no  distention noted.  No tenderness, rebound or guarding.  PELVIC: inflamed and ulcerated perineum with lichen sclerotic lesions over labia, some yeast may be present; normal appearing vaginal mucosa and cervix.  No abnormal discharge noted.  Pap smear obtained.  Normal uterine size, no other palpable masses, no uterine or adnexal tenderness. MUSCULOSKELETAL: Normal range of motion. No tenderness.  No cyanosis, clubbing, or edema.  2+ distal pulses.  Exam done with chaperone present.  Assessment and Plan:  1. Encounter for annual routine gynecological examination - Cytology - PAP( [facility]) - Herpes simplex virus culture - NuSwab BV and Candida, NAA  2. Screening for colon cancer - Ambulatory referral to Gastroenterology  3. Routine screening for STI (sexually transmitted infection) - HIV antibody (with reflex) - RPR - Herpes simplex virus culture  4. High risk HPV infection - Difficult to say whether her perineal lesion is from HPV condyloma or severe genital HSV outbreak - Recommend follow up with GYN attending  5. Herpes simplex - Lesion cultured - valACYclovir (VALTREX) 1000 MG tablet; Take 1 tablet (1,000 mg total) by mouth daily for 14 days.  Dispense: 14 tablet; Refill: 0 - Herpes simplex virus culture  6. Vaginal yeast infection - fluconazole (DIFLUCAN) 150 MG tablet; Take 1 tablet (150 mg total) by mouth every 3 (three) days for 2 doses.  Dispense: 2 tablet; Refill: 0 - NuSwab BV and Candida, NAA  Will follow up results of pap smear and manage accordingly. Encouraged improvement in diet and exercise.  -Mammogram ordered -Referral for colonoscopy -Flu vaccine declined today, received in fall 2020 -Gardisil n/a  Routine preventative health maintenance measures emphasized. Please refer to After Visit Summary for other counseling recommendations.   Total face-to-face time with patient: 23 minutes. Over 50% of encounter was spent on counseling and coordination of  care.   Merilyn Baba, DO OB Fellow, Faculty Practice 12/11/2019 2:09 PM

## 2019-12-11 NOTE — Patient Instructions (Signed)

## 2019-12-12 LAB — RPR: RPR Ser Ql: NONREACTIVE

## 2019-12-12 LAB — HIV ANTIBODY (ROUTINE TESTING W REFLEX): HIV Screen 4th Generation wRfx: NONREACTIVE

## 2019-12-13 LAB — NUSWAB BV AND CANDIDA, NAA
Candida albicans, NAA: NEGATIVE
Candida glabrata, NAA: NEGATIVE

## 2019-12-13 LAB — HERPES SIMPLEX VIRUS CULTURE

## 2019-12-16 LAB — CYTOLOGY - PAP
Comment: NEGATIVE
Diagnosis: NEGATIVE
High risk HPV: NEGATIVE

## 2019-12-18 ENCOUNTER — Encounter (HOSPITAL_COMMUNITY): Payer: Self-pay | Admitting: Psychiatry

## 2019-12-18 ENCOUNTER — Ambulatory Visit (INDEPENDENT_AMBULATORY_CARE_PROVIDER_SITE_OTHER): Payer: No Typology Code available for payment source | Admitting: Psychiatry

## 2019-12-18 ENCOUNTER — Other Ambulatory Visit: Payer: Self-pay

## 2019-12-18 DIAGNOSIS — F411 Generalized anxiety disorder: Secondary | ICD-10-CM | POA: Diagnosis not present

## 2019-12-18 DIAGNOSIS — F319 Bipolar disorder, unspecified: Secondary | ICD-10-CM

## 2019-12-18 MED ORDER — TOPIRAMATE 25 MG PO TABS: 25.0000 mg | ORAL_TABLET | Freq: Every day | ORAL | 0 refills | Status: DC

## 2019-12-18 MED ORDER — LORAZEPAM 1 MG PO TABS: 1.0000 mg | ORAL_TABLET | Freq: Two times a day (BID) | ORAL | 2 refills | Status: DC

## 2019-12-18 MED ORDER — TRAZODONE HCL 100 MG PO TABS: 100.0000 mg | ORAL_TABLET | Freq: Every day | ORAL | 0 refills | Status: DC

## 2019-12-18 MED ORDER — BUPROPION HCL ER (XL) 150 MG PO TB24: 150.0000 mg | ORAL_TABLET | ORAL | 0 refills | Status: DC

## 2019-12-18 MED ORDER — VENLAFAXINE HCL ER 37.5 MG PO CP24: ORAL_CAPSULE | ORAL | 0 refills | Status: DC

## 2019-12-18 MED ORDER — OLANZAPINE 15 MG PO TABS: 15.0000 mg | ORAL_TABLET | Freq: Every day | ORAL | 0 refills | Status: DC

## 2019-12-18 MED FILL — buPROPion HCL ER (XL) 150 M: 150 | 30 days supply | Qty: 30 | Fill #0

## 2019-12-18 MED FILL — traZODone HCL 100 MG TABS: 100 | 90 days supply | Qty: 90 | Fill #0

## 2019-12-18 MED FILL — TOPIRAMATE 25 MG TABLET: 25 | 90 days supply | Qty: 90 | Fill #0

## 2019-12-18 MED FILL — OLANZapine 15 MG TABS: 15 | 90 days supply | Qty: 90 | Fill #0

## 2019-12-18 MED FILL — VENLAFAXINE HCL ER 37.5 MG: 37.5 | 30 days supply | Qty: 60 | Fill #0

## 2019-12-18 NOTE — Progress Notes (Signed)
Virtual Visit via Telephone Note  I connected with [place] on 12/18/19 at  4:20 PM EDT by telephone and verified that I am speaking with the correct person using two identifiers.   I discussed the limitations, risks, security and privacy concerns of performing an evaluation and management service by telephone and the availability of in person appointments. I also discussed with the patient that there may be a patient responsible charge related to this service. The patient expressed understanding and agreed to proceed.   History of Present Illness: Patient was evaluated by phone session.  She has been experiencing increased anxiety and nervousness.  She is now anxious about everything including her health condition, job, driving and there are days when she does not leave the house other than her job.  She is working only one job.  Though she denies any mania, anger but admitted irritability and frustration.  She does not feel that current medicine is working.  She is open to try a different medication.  Her headaches are not as bad and she takes Topamax and sometimes Tylenol.  Her energy level is low.  Her weight is not significant change from the past.  She has no tremors, shakes or any EPS.   Gait is not significant change.   Past Psychiatric History:Reviewed. H/Obipolar disorder, alcohol dependence and marijuana abuse. H/Oat least 6 inpatient. Triedlithium, Seroquel, Lamictal, Paxil, Celexa, Abilify, Geodon,Klonopin, Depakote, Neurontin and Cymbalta. No H/ohistory of suicidal attempt.   Psychiatric Specialty Exam: Physical Exam  Review of Systems  There were no vitals taken for this visit.There is no height or weight on file to calculate BMI.  General Appearance: NA  Eye Contact:  NA  Speech:  Slow  Volume:  Normal  Mood:  Anxious and Dysphoric  Affect:  NA  Thought Process:  Goal Directed  Orientation:  Full (Time, Place, and Person)  Thought Content:  Rumination   Suicidal Thoughts:  No  Homicidal Thoughts:  No  Memory:  Immediate;   Good Recent;   Good Remote;   Good  Judgement:  Intact  Insight:  Present  Psychomotor Activity:  NA  Concentration:  Concentration: Fair and Attention Span: Fair  Recall:  Good  Fund of Knowledge:  Good  Language:  Good  Akathisia:  No  Handed:  Right  AIMS (if indicated):     Assets:  Communication Skills Desire for Improvement Housing Resilience Social Support Talents/Skills  ADL's:  Intact  Cognition:  WNL  Sleep:   fair      Assessment and Plan: Bipolar disorder type I.  Generalized anxiety disorder.  Patient is experiencing increased anxiety and she is nervous about everything including her job, driving, and general health.  She worried about future.  She is compliant with medication but sometimes she feels they are not working.  We gave her the option of Klonopin or Valium but patient is not comfortable and she had tried Klonopin but it make her more tired.  I recommend to try venlafaxine since she has never tried recently.  We will cut down her Wellbutrin dose to 150 mg only, start venlafaxine 37.5 mg daily for 1 week and then 75 mg daily, continue olanzapine 15 mg at bedtime, continue trazodone 100 mg at bedtime, Topamax 25 mg at bedtime and Ativan 1 mg twice a day.  She is not interested in therapy.  Discussed medication side effects and benefits.  Recommended to call us back if she has any question or any concern.  If venlafaxine works good then we may consider taking her off from Wellbutrin.  Follow-up in 4 weeks.  Follow Up Instructions:    I discussed the assessment and treatment plan with the patient. The patient was provided an opportunity to ask questions and all were answered. The patient agreed with the plan and demonstrated an understanding of the instructions.   The patient was advised to call back or seek an in-person evaluation if the symptoms worsen or if the condition fails to  improve as anticipated.  I provided 20 minutes of non-face-to-face time during this encounter.   Kathlee Nations, MD

## 2019-12-20 MED FILL — LORazepam 1 MG TABS: 1 | 30 days supply | Qty: 60 | Fill #0

## 2019-12-20 MED FILL — LISINOPRIL-HCTZ 20-12.5 MG: 20-12.5 | 90 days supply | Qty: 90 | Fill #0

## 2020-01-08 ENCOUNTER — Ambulatory Visit: Payer: No Typology Code available for payment source | Admitting: Internal Medicine

## 2020-01-08 ENCOUNTER — Encounter: Payer: Self-pay | Admitting: Internal Medicine

## 2020-01-08 VITALS — BP 112/66 | HR 70 | Temp 98.4°F | Ht 65.5 in | Wt 220.0 lb

## 2020-01-08 DIAGNOSIS — K30 Functional dyspepsia: Secondary | ICD-10-CM

## 2020-01-08 DIAGNOSIS — Z8371 Family history of colonic polyps: Secondary | ICD-10-CM

## 2020-01-08 MED ORDER — GABAPENTIN 300 MG PO CAPS: 300.0000 mg | ORAL_CAPSULE | Freq: Every day | ORAL | 3 refills | Status: DC

## 2020-01-08 MED ORDER — OMEPRAZOLE 40 MG PO CPDR: DELAYED_RELEASE_CAPSULE | ORAL | 0 refills | Status: DC

## 2020-01-08 MED FILL — OMEPRAZOLE 40 MG CPDR: 40 | 90 days supply | Qty: 180 | Fill #0

## 2020-01-08 MED FILL — GABAPENTIN 300 MG CAPSULE: 300 | 90 days supply | Qty: 90 | Fill #0

## 2020-01-08 NOTE — Patient Instructions (Signed)
We have sent the following medications to your pharmacy for you to pick up at your convenience: Gabapentin and omeprazole   We put you in the system for a December 2024 colonoscopy recall.   Follow up in a year or sooner if needed.    I appreciate the opportunity to care for you. Silvano Rusk, MD, [facility]

## 2020-01-08 NOTE — Progress Notes (Signed)
Sandra Miller 44 y.o. 1975/11/17 [id number]  Assessment & Plan:   Encounter Diagnoses  Name Primary?  . Functional dyspepsia Yes  . Family history of colonic polyps     Refill omeprazole and gabapentin.  She is doing well return in a year.  I do not think we need to change her colonoscopy recall from 10 years after the last based upon her brother having polyps she understands.   Subjective:   Chief Complaint: Reflux and dyspepsia  HPI Sandra Miller is here for follow-up she is maintained on omeprazole and gabapentin to treat functional dyspepsia and reflux symptomatology.  She is doing well and would like refills.  She does report her brother had precancerous colon polyps and wonders if she should change when she has a colonoscopy based upon that.  He is about 68 she thinks.  No cancer just some precancerous polyps she is not sure how many. Allergies  Allergen Reactions  . Depakote [Divalproex Sodium] Nausea And Vomiting  . Minocycline Hives  . Aspirin Hives    States able to tolerate Goody Powders without any problem except for GI upset   Current Meds  Medication Sig  . acetaminophen (TYLENOL) 500 MG tablet Take 1,000 mg by mouth every 6 (six) hours as needed for moderate pain or fever.  Marland Kitchen buPROPion (WELLBUTRIN XL) 150 MG 24 hr tablet Take 1 tablet (150 mg total) by mouth every morning.  . gabapentin (NEURONTIN) 300 MG capsule Take 1 capsule (300 mg total) by mouth daily.  Marland Kitchen lisinopril-hydrochlorothiazide (ZESTORETIC) 20-12.5 MG tablet Take 1 tablet by mouth daily.  Marland Kitchen LORazepam (ATIVAN) 1 MG tablet Take 1 tablet (1 mg total) by mouth 2 (two) times daily.  . Multiple Vitamin (MULTIVITAMIN) tablet Take 1 tablet by mouth daily.  Marland Kitchen OLANZapine (ZYPREXA) 15 MG tablet Take 1 tablet (15 mg total) by mouth at bedtime.  Marland Kitchen omeprazole (PRILOSEC) 40 MG capsule TAKE 1 CAPSULE(40 MG) BY MOUTH TWICE DAILY BEFORE A MEAL  . Probiotic Product (PROBIOTIC DAILY PO) Take by mouth.  . topiramate  (TOPAMAX) 25 MG tablet Take 1 tablet (25 mg total) by mouth at bedtime.  . traZODone (DESYREL) 100 MG tablet Take 1 tablet (100 mg total) by mouth at bedtime.  Marland Kitchen venlafaxine XR (EFFEXOR XR) 37.5 MG 24 hr capsule Take one capsule daily for one week and than two capsule daily  . [DISCONTINUED] gabapentin (NEURONTIN) 300 MG capsule TAKE 1 CAPSULE(300 MG) BY MOUTH DAILY  . [DISCONTINUED] omeprazole (PRILOSEC) 40 MG capsule TAKE 1 CAPSULE(40 MG) BY MOUTH TWICE DAILY BEFORE A MEAL   Past Medical History:  Diagnosis Date  . Bipolar 1 disorder ([place])   . Complication of anesthesia    wakes up during procedures  . GAD (generalized anxiety disorder)   . Genital HSV    currently per pt  no break out 03-22-2016   . GERD (gastroesophageal reflux disease)   . Hiatal hernia   . History of cervical dysplasia    2012 laser ablation  . History of esophageal dilatation    for dysphasia -- x2 dilated  . History of gastric ulcer   . History of Helicobacter pylori infection    remote hx  . History of hidradenitis suppurativa    "gets all over body intermittantly"    . History of hypertension    no issue since stopped drinking alcohol 2014  . History of kidney stones   . History of panic attacks   . Iron deficiency anemia   .  Left ureteral stone   . OCD (obsessive compulsive disorder)   . PTSD (post-traumatic stress disorder)   . Recovering alcoholic in remission (Sandra Miller)    since 2014  . RLS (restless legs syndrome)   . Smokers' cough ([place])   . Urgency of urination   . Yeast infection involving the vagina and surrounding area    secondary to taking antibiotic   Past Surgical History:  Procedure Laterality Date  . CESAREAN SECTION  1995   w/  Bilateral Tubal Ligation  . COLONOSCOPY  last one 08-09-2013  . CYSTOSCOPY W/ URETERAL STENT PLACEMENT Left 03/29/2016   Procedure: CYSTOSCOPY WITH STENT REPLACEMENT;  Surgeon: Nickie Retort, MD;  Location: [facility];  Service: Urology;   Laterality: Left;  . CYSTOSCOPY WITH RETROGRADE PYELOGRAM, URETEROSCOPY AND STENT PLACEMENT Left 03/08/2016   Procedure: CYSTOSCOPY WITH  LEFT RETROGRADE PYELOGRAM, AND STENT PLACEMENT;  Surgeon: Nickie Retort, MD;  Location: [facility];  Service: Urology;  Laterality: Left;  . CYSTOSCOPY/RETROGRADE/URETEROSCOPY/STONE EXTRACTION WITH BASKET Left 03/29/2016   Procedure: CYSTOSCOPY/RETROGRADE/URETEROSCOPY/STONE EXTRACTION WITH BASKET;  Surgeon: Nickie Retort, MD;  Location: [facility];  Service: Urology;  Laterality: Left;  . ENDOMETRIAL ABLATION W/ NOVASURE  04-01-2010  . ESOPHAGOGASTRODUODENOSCOPY  last one 08-09-2013  . KNEE ARTHROSCOPY Left as teen  . LASER ABLATION OF THE CERVIX  2012 approx  . TRANSTHORACIC ECHOCARDIOGRAM  05-19-2006   lvsf normal, ef 55-65%, there was mild flattening of the interventricular septum during diastoli/  RV size at upper limits normal  . TUBAL LIGATION    . WISDOM TOOTH EXTRACTION  age 8 's   Social History   Social History Narrative   Former Sales promotion account executive patient.      Was on disability at one point.   Return to the workforce.  40 hours a week at [employer], 10 hours a week on the weekends at [employer] in [place] at night 10 to 12 hours a week.      Has grown children, she lives alone with a pet, continues to smoke no alcohol or drug use at this time      History of EtOH abuse and THC use.         family history includes ADD / ADHD in her brother; Alcohol abuse in her brother and father; Anxiety disorder in her maternal aunt and mother; Cirrhosis in her cousin; Colon polyps in her brother; Depression in her maternal aunt and mother; Diabetes in her maternal grandfather and paternal grandmother; Drug abuse in her brother and brother; Heart disease in her father and mother; Kidney disease in her maternal uncle; Lung cancer in her father; Stomach cancer in her paternal grandfather.   Review of Systems As  above Objective:   Physical Exam BP 112/66   Pulse 70   Temp 98.4 F (36.9 C)   Ht 5' 5.5" (1.664 m)   Wt 220 lb (99.8 kg)   BMI 36.05 kg/m

## 2020-01-15 ENCOUNTER — Ambulatory Visit: Payer: No Typology Code available for payment source | Admitting: Family Medicine

## 2020-01-17 ENCOUNTER — Other Ambulatory Visit (HOSPITAL_COMMUNITY): Payer: Self-pay | Admitting: Psychiatry

## 2020-01-17 DIAGNOSIS — F411 Generalized anxiety disorder: Secondary | ICD-10-CM

## 2020-01-17 DIAGNOSIS — F319 Bipolar disorder, unspecified: Secondary | ICD-10-CM

## 2020-01-22 ENCOUNTER — Ambulatory Visit (HOSPITAL_COMMUNITY): Payer: No Typology Code available for payment source | Admitting: Psychiatry

## 2020-01-23 ENCOUNTER — Other Ambulatory Visit (HOSPITAL_COMMUNITY): Payer: Self-pay | Admitting: *Deleted

## 2020-01-23 ENCOUNTER — Other Ambulatory Visit (HOSPITAL_COMMUNITY)
Admission: RE | Admit: 2020-01-23 | Discharge: 2020-01-23 | Disposition: A | Discharge status: Home / Self Care | Payer: No Typology Code available for payment source | Source: Ambulatory Visit | Attending: Family Medicine | Admitting: Family Medicine

## 2020-01-23 ENCOUNTER — Ambulatory Visit (INDEPENDENT_AMBULATORY_CARE_PROVIDER_SITE_OTHER): Payer: No Typology Code available for payment source | Admitting: Family Medicine

## 2020-01-23 ENCOUNTER — Encounter: Payer: Self-pay | Admitting: Family Medicine

## 2020-01-23 ENCOUNTER — Other Ambulatory Visit: Payer: Self-pay

## 2020-01-23 VITALS — BP 136/73 | HR 87 | Wt 218.8 lb

## 2020-01-23 DIAGNOSIS — A630 Anogenital (venereal) warts: Secondary | ICD-10-CM | POA: Diagnosis present

## 2020-01-23 DIAGNOSIS — N766 Ulceration of vulva: Secondary | ICD-10-CM | POA: Insufficient documentation

## 2020-01-23 DIAGNOSIS — F411 Generalized anxiety disorder: Secondary | ICD-10-CM

## 2020-01-23 DIAGNOSIS — C519 Malignant neoplasm of vulva, unspecified: Secondary | ICD-10-CM | POA: Diagnosis not present

## 2020-01-23 DIAGNOSIS — F319 Bipolar disorder, unspecified: Secondary | ICD-10-CM

## 2020-01-23 MED ORDER — VENLAFAXINE HCL ER 75 MG PO CP24: ORAL_CAPSULE | ORAL | 0 refills | Status: DC

## 2020-01-23 MED ORDER — BUPROPION HCL ER (XL) 150 MG PO TB24: 150.0000 mg | ORAL_TABLET | ORAL | 0 refills | Status: DC

## 2020-01-23 MED FILL — GABAPENTIN 300 MG CAPSULE: 300 | 90 days supply | Qty: 90 | Fill #0

## 2020-01-23 MED FILL — OMEPRAZOLE 40 MG CPDR: 40 | 90 days supply | Qty: 180 | Fill #0

## 2020-01-23 NOTE — Progress Notes (Signed)
   Subjective:    Patient ID: Sandra Miller is a 44 y.o. female presenting with Follow-up  on 01/23/2020  HPI: Noticed something when she wipes. It has been present for at least 6 years. Nothing has been done to help it. She reports that she has some irritation with bathing. She has h/o biopsy in past which just showed inclusion cyst with FM. This does not appear to be what this is.  Review of Systems  Constitutional: Negative for chills and fever.  Respiratory: Negative for shortness of breath.   Cardiovascular: Negative for chest pain.  Gastrointestinal: Negative for abdominal pain, nausea and vomiting.  Genitourinary: Negative for dysuria.  Skin: Negative for rash.      Objective:    BP 136/73   Pulse 87   Wt 218 lb 12.8 oz (99.2 kg)   BMI 35.86 kg/m  Physical Exam Constitutional:      General: She is not in acute distress.    Appearance: She is well-developed.  HENT:     Head: Normocephalic and atraumatic.  Eyes:     General: No scleral icterus. Cardiovascular:     Rate and Rhythm: Normal rate.  Pulmonary:     Effort: Pulmonary effort is normal.  Abdominal:     Palpations: Abdomen is soft.  Genitourinary:    Comments: Large 3.5 x 3 cm ulcer at posterior fourchette with some rounded edges. Ulceration goes to rectum. Several other verrucous type lesions noted. Musculoskeletal:     Cervical back: Neck supple.  Skin:    General: Skin is warm and dry.  Neurological:     Mental Status: She is alert and oriented to person, place, and time.   Media Information   Procedure: Patient identified, informed consent signed, copy in chart, time out performed.    Area cleansed with Alcohol.  Injected with 1% Lidocaine with Epi.  3 mL. A 3 mm punch biopsy performed without difficulty.  Hemostasis obtained with Silver Nitrate.  Patient tolerated procedure well.   Patient given post procedure instructions.  Patient will return in 2 weeks for biopsy results.         Assessment & Plan:   Problem List Items Addressed This Visit      Unprioritized   Genital warts - Primary    Unclear if this is true--biopsy of area sent in.      Relevant Orders   Surgical pathology( [place]/ [facility])   Vulvar ulceration    Unclear etiology--biopsy obtained      Relevant Orders   Surgical pathology( / [facility])      Total time: 30 minutes spent reviewing prior records, pathology, notes, looking up possible diagnoses and differential, note writing.  Return in about 3 months (around 04/24/2020) for virtual.  Donnamae Jude 01/23/2020 3:28 PM

## 2020-01-23 NOTE — Assessment & Plan Note (Signed)
Unclear if this is true--biopsy of area sent in.

## 2020-01-23 NOTE — Progress Notes (Signed)
GAD- 7 positive today: score 17. Pt has history of anxiety. Currently working with psychiatrist to adjust anxiety medications at [facility]. Pt is satisfied with her care at this time.  Apolonio Schneiders RN 01/23/20

## 2020-01-23 NOTE — Patient Instructions (Signed)
Vulva Biopsy, Care After This sheet gives you information about how to care for yourself after your procedure. Your doctor may also give you more specific instructions. If you have problems or questions, contact your doctor. What can I expect after the procedure? After the procedure, it is common to have:  Slight bleeding from the biopsy site.  Soreness at the biopsy site. Follow these instructions at home: Biopsy site care   Follow instructions from your doctor about how to take care of your biopsy site. Make sure you: ? Clean the area using water and mild soap two times a day or as told by your doctor. Gently pat the area dry. ? If you were prescribed an antibiotic ointment, apply it as told by your doctor. Do not stop using the antibiotic even if your condition gets better. ? Take a warm water bath (sitz bath) as needed to help with pain. A sitz bath is taken while you are sitting down. The water should only come up to your hips and cover your butt. ? Leave stitches (sutures), skin glue, or skin tape (adhesive) strips in place. They may need to stay in place for 2 weeks or longer. If tape strips get loose and curl up, you may trim the loose edges. Do not remove tape strips completely unless your doctor says it is okay. ? Check your biopsy area every day for signs of infection. Check for:  More redness, swelling, or pain.  More fluid or blood.  Warmth.  Pus or a bad smell. ? Do not rub the biopsy area after peeing (urinating).  Gently pat the area dry, or use a bottle filled with warm water (peri-bottle) to clean the area.  Gently wipe from front to back. Lifestyle  Wear loose, cotton underwear.  Do not wear tight pants.  Do not use a tampon, douche, or put anything in your vagina for at least 1 week or until your doctor says it is okay.  Do not have sex for at least 1 week or until your doctor says it is okay.  Do not exercise until your doctor says it is okay.  Do not  swim or use a hot tub until your doctor says it is okay. You may shower or take a sitz bath. General instructions  Take over-the-counter and prescription medicines only as told by your doctor.  Use a sanitary pad until the bleeding stops.  Keep all follow-up visits as told by your doctor. This is important. Contact a doctor if:  You have more redness, swelling, or pain around your biopsy site.  You have more fluid or blood coming from your biopsy site.  Your biopsy site feels warm when you touch it.  Medicines do not help with your pain. Get help right away if:  You have a lot of bleeding from the vulva.  You have pus or a bad smell coming from your biopsy site.  You have a fever.  You have pain in the lower belly (abdomen). Summary  After the procedure, it is common to have slight bleeding and soreness at the biopsy site.  Follow all instructions as told by your doctor. Clean the area with water and mild soap. Do not rub. Pat the area dry.  Take sitz baths as needed. Leave any stitches in place.  Check your biopsy site for infection. Signs include more redness, swelling, pain, fluid, or blood, or feeling warm when you touch it.  Get help right away if you have a   lot of bleeding, a fever, pus or a bad smell, or pain in your lower belly. This information is not intended to replace advice given to you by your health care provider. Make sure you discuss any questions you have with your health care provider. Document Revised: 02/22/2018 Document Reviewed: 02/22/2018 Elsevier Patient Education  2020 Elsevier Inc.  

## 2020-01-23 NOTE — Assessment & Plan Note (Signed)
Unclear etiology--biopsy obtained

## 2020-01-24 MED FILL — buPROPion HCL ER (XL) 150 M: 150 | 6 days supply | Qty: 6 | Fill #0

## 2020-01-24 MED FILL — VENLAFAXINE HCL ER 75 MG CA: 75 | 30 days supply | Qty: 30 | Fill #0

## 2020-01-27 ENCOUNTER — Telehealth: Payer: Self-pay | Admitting: Family Medicine

## 2020-01-27 ENCOUNTER — Other Ambulatory Visit: Payer: Self-pay | Admitting: Family Medicine

## 2020-01-27 DIAGNOSIS — Z1231 Encounter for screening mammogram for malignant neoplasm of breast: Secondary | ICD-10-CM

## 2020-01-27 DIAGNOSIS — C519 Malignant neoplasm of vulva, unspecified: Secondary | ICD-10-CM

## 2020-01-27 NOTE — Telephone Encounter (Signed)
I am referring her to GYN/Oncology due to biopsy results. I have attempted to inform the patient and have her an appointment scheduled for Wednesday 01/29/2020 @ 10 am

## 2020-01-28 ENCOUNTER — Other Ambulatory Visit: Payer: Self-pay

## 2020-01-28 ENCOUNTER — Encounter (HOSPITAL_COMMUNITY): Payer: Self-pay | Admitting: Psychiatry

## 2020-01-28 ENCOUNTER — Telehealth (INDEPENDENT_AMBULATORY_CARE_PROVIDER_SITE_OTHER): Payer: No Typology Code available for payment source | Admitting: Psychiatry

## 2020-01-28 DIAGNOSIS — F319 Bipolar disorder, unspecified: Secondary | ICD-10-CM | POA: Diagnosis not present

## 2020-01-28 DIAGNOSIS — F411 Generalized anxiety disorder: Secondary | ICD-10-CM | POA: Diagnosis not present

## 2020-01-28 MED ORDER — LORAZEPAM 1 MG PO TABS: 1.0000 mg | ORAL_TABLET | Freq: Three times a day (TID) | ORAL | 0 refills | Status: DC | PRN

## 2020-01-28 MED ORDER — VENLAFAXINE HCL ER 37.5 MG PO CP24: ORAL_CAPSULE | ORAL | 0 refills | Status: DC

## 2020-01-28 MED FILL — VENLAFAXINE HCL ER 37.5 MG: 37.5 | 30 days supply | Qty: 90 | Fill #0

## 2020-01-28 NOTE — Progress Notes (Signed)
Virtual Visit via Telephone Note  I connected with Sandra Miller on 01/28/20 at  4:00 PM EDT by telephone and verified that I am speaking with the correct person using two identifiers.   I discussed the limitations, risks, security and privacy concerns of performing an evaluation and management service by telephone and the availability of in person appointments. I also discussed with the patient that there may be a patient responsible charge related to this service. The patient expressed understanding and agreed to proceed.   Patient location; home Provider location; home office  History of Present Illness: Patient is evaluated by phone session.  She endorsed increased anxiety since she received the news yesterday that she have Vulvar invasive squamous cell carcinoma.  Patient told that she is going to see cancer doctor tomorrow and she is very nervous.  Since she received the news she is having crying spells, racing thoughts, feeling more nervous and anxious and irritable.  She had shared the news to her family member and she got a lot of support from children and siblings.  She is staying at her mother's house.  She admitted to some time feels the medicine is not working.  She is taking venlafaxine and Wellbutrin has been discontinued because she felt the Wellbutrin was not working.  She feels the venlafaxine is somewhat better than Wellbutrin but now off of the news her anxiety is worse.  She also complaining of headaches every day.  She supposed to see neurology but now she have to reschedule as she like to take care of her oncology appointments.  Her sibling and sister given her a lot of support and agreed to take her to the doctor's appointment if she needed.  She is working mostly 1 job but also works a second job as needed.  She denies any suicidal thoughts, homicidal thoughts, mania or psychosis.  Her energy level is low.  Appetite is decreased since yesterday.  She has no tremors shakes or any  EPS.  Past Psychiatric History:Reviewed. H/Obipolar disorder, alcohol dependence and marijuana abuse. H/Oat least 6 inpatient. Triedlithium, Seroquel, Lamictal, Paxil, Celexa, Abilify, Geodon,Klonopin, Depakote, Neurontin and Cymbalta. No H/ohistory of suicidal attempt.  Psychiatric Specialty Exam: Physical Exam  Review of Systems  Neurological: Positive for headaches.  Psychiatric/Behavioral: Positive for dysphoric mood.    Weight 218 lb (98.9 kg).There is no height or weight on file to calculate BMI.  General Appearance: NA  Eye Contact:  NA  Speech:  Slow  Volume:  Decreased  Mood:  Anxious and Depressed  Affect:  NA  Thought Process:  Goal Directed  Orientation:  Full (Time, Place, and Person)  Thought Content:  Rumination  Suicidal Thoughts:  No  Homicidal Thoughts:  No  Memory:  Immediate;   Good Recent;   Good Remote;   Good  Judgement:  Intact  Insight:  Present  Psychomotor Activity:  NA  Concentration:  Concentration: Fair and Attention Span: Fair  Recall:  Good  Fund of Knowledge:  Good  Language:  Good  Akathisia:  No  Handed:  Right  AIMS (if indicated):     Assets:  Communication Skills Desire for Improvement Housing Resilience Social Support Transportation  ADL's:  Intact  Cognition:  WNL  Sleep:   ok      Assessment and Plan: Bipolar disorder type I.  Generalized anxiety disorder.  Patient is experiencing increased anxiety since she received the biopsy results and now scheduled to see oncologist tomorrow.  Reassurance given.  She like to increase her medication since she is feeling a lot of anxiety.  I recommend to try lorazepam 1 mg up to 3 times a day and venlafaxine 112.5 to address her anxiety symptoms.  If she do not feel improvement in her anxiety then we will consider either Klonopin or Valium which we discussed on the last visit.  She agreed with the plan.  I also encouraged that she should keep appointment with neurology to address  her headaches.  We are giving Topamax 25 mg at bedtime which help sometimes.  She will continue olanzapine 15 mg at a time, trazodone 100 mg at bedtime and Topamax 25 mg at bedtime.  She is also getting gabapentin from other provider.  I reviewed blood work results.  She is not interested in therapy.  Recommended to call us back if she feels worsening of the symptoms or having any side effects.  Follow-up in 4 weeks.  Time spent 20 minutes.  Follow Up Instructions:    I discussed the assessment and treatment plan with the patient. The patient was provided an opportunity to ask questions and all were answered. The patient agreed with the plan and demonstrated an understanding of the instructions.   The patient was advised to call back or seek an in-person evaluation if the symptoms worsen or if the condition fails to improve as anticipated.  I provided 25 minutes of non-face-to-face time during this encounter.   Kathlee Nations, MD

## 2020-01-29 ENCOUNTER — Encounter: Payer: Self-pay | Admitting: Gynecologic Oncology

## 2020-01-29 ENCOUNTER — Other Ambulatory Visit: Payer: Self-pay

## 2020-01-29 ENCOUNTER — Inpatient Hospital Stay: Payer: No Typology Code available for payment source | Attending: Gynecologic Oncology | Admitting: Gynecologic Oncology

## 2020-01-29 VITALS — BP 136/77 | HR 115 | Temp 99.3°F | Resp 18 | Ht 65.5 in | Wt 218.1 lb

## 2020-01-29 DIAGNOSIS — F17210 Nicotine dependence, cigarettes, uncomplicated: Secondary | ICD-10-CM | POA: Insufficient documentation

## 2020-01-29 DIAGNOSIS — E669 Obesity, unspecified: Secondary | ICD-10-CM | POA: Diagnosis not present

## 2020-01-29 DIAGNOSIS — F4310 Post-traumatic stress disorder, unspecified: Secondary | ICD-10-CM | POA: Insufficient documentation

## 2020-01-29 DIAGNOSIS — Z6835 Body mass index (BMI) 35.0-35.9, adult: Secondary | ICD-10-CM | POA: Insufficient documentation

## 2020-01-29 DIAGNOSIS — I10 Essential (primary) hypertension: Secondary | ICD-10-CM | POA: Insufficient documentation

## 2020-01-29 DIAGNOSIS — K219 Gastro-esophageal reflux disease without esophagitis: Secondary | ICD-10-CM | POA: Insufficient documentation

## 2020-01-29 DIAGNOSIS — F429 Obsessive-compulsive disorder, unspecified: Secondary | ICD-10-CM | POA: Insufficient documentation

## 2020-01-29 DIAGNOSIS — Z98891 History of uterine scar from previous surgery: Secondary | ICD-10-CM | POA: Insufficient documentation

## 2020-01-29 DIAGNOSIS — F419 Anxiety disorder, unspecified: Secondary | ICD-10-CM

## 2020-01-29 DIAGNOSIS — G2581 Restless legs syndrome: Secondary | ICD-10-CM | POA: Insufficient documentation

## 2020-01-29 DIAGNOSIS — C519 Malignant neoplasm of vulva, unspecified: Secondary | ICD-10-CM | POA: Diagnosis not present

## 2020-01-29 DIAGNOSIS — Z8249 Family history of ischemic heart disease and other diseases of the circulatory system: Secondary | ICD-10-CM | POA: Insufficient documentation

## 2020-01-29 DIAGNOSIS — F319 Bipolar disorder, unspecified: Secondary | ICD-10-CM | POA: Insufficient documentation

## 2020-01-29 DIAGNOSIS — Z833 Family history of diabetes mellitus: Secondary | ICD-10-CM | POA: Insufficient documentation

## 2020-01-29 DIAGNOSIS — Z801 Family history of malignant neoplasm of trachea, bronchus and lung: Secondary | ICD-10-CM | POA: Insufficient documentation

## 2020-01-29 DIAGNOSIS — Z720 Tobacco use: Secondary | ICD-10-CM

## 2020-01-29 DIAGNOSIS — Z79899 Other long term (current) drug therapy: Secondary | ICD-10-CM | POA: Insufficient documentation

## 2020-01-29 DIAGNOSIS — Z8049 Family history of malignant neoplasm of other genital organs: Secondary | ICD-10-CM | POA: Insufficient documentation

## 2020-01-29 MED ORDER — NICOTINE 21 MG/24HR TD PT24: 21.0000 mg | MEDICATED_PATCH | Freq: Every day | TRANSDERMAL | 0 refills | Status: DC

## 2020-01-29 MED FILL — NICOTINE 21 MG/24HR PATCH: 21 | 28 days supply | Qty: 28 | Fill #0

## 2020-01-29 NOTE — Progress Notes (Signed)
Consult Note: Gyn-Onc  Consult was requested by Dr. Kennon Rounds for the evaluation of Sandra Miller 44 y.o. female  CC:  Chief Complaint  Patient presents with  . Vulvar cancer [facility])    New Patient    Assessment/Plan:  Sandra Miller  is a 44 y.o.  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  I am recommending PET scan to evaluate for metastatic disease to the nodes or distant sites.  If negative, I am recommending proceeding with radical posterior vulvectomy with SLN biopsy. I discussed that the patient has a high risk of having close or positive margins due to the proximity to the anus and anal sphincter.  I explained that dysfunction and defecation may result from her surgery.  Close to positive margins will require adjuvant radiation postoperatively and I explained this to the patient.  Due to her young age, clinically negative nodes, and history of employment in a physically active job, I think she is a particularly good candidate for sentinel lymph node biopsy.  I explained that we do not offer this at [facility] as there is no equipment available in an inpatient setting for such a procedure here.  However this equipment is available in appropriate setting at [facility] and therefore I will have the patient seen by my partner, Dr. Margaretmary Bayley to discuss proceeding with surgery in early June.  I outlined the anticipated surgery and its potential for complications as stated above.  I explained that her history of tobacco abuse places her at particular risk for complications in the wound bed, particularly failure of wound healing, infection.  I explained that if sentinel lymph node biopsy is unsuccessful and mapping a sentinel node bilateral she may require complete lymphadenectomy in which case there is approximately 33% risk for lifelong lymphedema.  I have prescribed her nicotine patch to assist in preoperative smoking cessation.   HPI: Sandra Miller is a 44 year old P2 who was seen in consultation at the request of Dr. Kennon Rounds for evaluation of squamous cell carcinoma of the vulva.  The patient reported an approximately 6-year history of a palpable lesion in the posterior vulva at the perineal body.  She had mentioned it to her care provider during the time of a Pap test approximately 2 years prior to diagnosis.  However her provider felt that this was most consistent with HPV changes and condyloma and therefore biopsy was not taken at that time.  The patient reported that she had not noted particular growth or change in the lesion over the years.  She was presenting for routine cervical cancer screening test in April 2021 and this was being performed by a different provider who felt less confident with the appearance of the mass on exam.  That Pap smear performed on December 12, 2019 was negative for intraepithelial lesion or malignancy and negative for high-risk HPV.  Due to the concerning appearance of this lesion her provider referred her to Dr. Jeannetta Nap, who performed an office biopsy of the lesion on Jan 23, 2020.  At 2 sites the biopsy returned as invasive squamous cell carcinoma.  Grade of the lesion was not provided.  The patient's medical history is significant for obesity with a BMI of 35, hypertension, bipolar disorder, anxiety, HSV chronic infection, tobacco abuse (1 pack/day since age 1).  She has a remote history of an abnormal Pap test, actually multiple of them, in her 69s for which  she had had procedures on her cervix including what she describes as freezing and burning procedures.  She has had no abnormal Paps in the last decade, and receives fairly regular gynecologic and primary care.  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  She works at [employer].  Her gynecologic history in addition to her history of  abnormal Paps is significant for 1 prior vaginal delivery 1 prior cesarean section.  Current Meds:  Outpatient Encounter Medications as of 01/29/2020  Medication Sig  . acetaminophen (TYLENOL) 500 MG tablet Take 1,000 mg by mouth every 6 (six) hours as needed for moderate pain or fever.  . gabapentin (NEURONTIN) 300 MG capsule Take 1 capsule (300 mg total) by mouth daily.  Marland Kitchen lisinopril-hydrochlorothiazide (ZESTORETIC) 20-12.5 MG tablet Take 1 tablet by mouth daily.  Marland Kitchen LORazepam (ATIVAN) 1 MG tablet Take 1 tablet (1 mg total) by mouth 3 (three) times daily as needed for anxiety.  . Multiple Vitamin (MULTIVITAMIN) tablet Take 1 tablet by mouth daily.  Marland Kitchen OLANZapine (ZYPREXA) 15 MG tablet Take 1 tablet (15 mg total) by mouth at bedtime.  Marland Kitchen omeprazole (PRILOSEC) 40 MG capsule TAKE 1 CAPSULE(40 MG) BY MOUTH TWICE DAILY BEFORE A MEAL  . Probiotic Product (PROBIOTIC DAILY PO) Take by mouth.  . topiramate (TOPAMAX) 25 MG tablet Take 1 tablet (25 mg total) by mouth at bedtime.  . traZODone (DESYREL) 100 MG tablet Take 1 tablet (100 mg total) by mouth at bedtime.  Marland Kitchen venlafaxine XR (EFFEXOR XR) 37.5 MG 24 hr capsule Take three capsule (112.5 mg total) by mouth daily.  . nicotine (NICODERM CQ - DOSED IN MG/24 HOURS) 21 mg/24hr patch Place 1 patch (21 mg total) onto the skin daily.  . [DISCONTINUED] buPROPion (WELLBUTRIN XL) 150 MG 24 hr tablet Take 1 tablet (150 mg total) by mouth every morning.  . [DISCONTINUED] LORazepam (ATIVAN) 1 MG tablet Take 1 tablet (1 mg total) by mouth 2 (two) times daily.  . [DISCONTINUED] venlafaxine XR (EFFEXOR XR) 75 MG 24 hr capsule Take one capsule (75 mg total) by mouth daily.   No facility-administered encounter medications on file as of 01/29/2020.    Allergy:  Allergies  Allergen Reactions  . Depakote [Divalproex Sodium] Nausea And Vomiting  . Minocycline Hives  . Aspirin Hives    States able to tolerate Goody Powders without any problem except for GI upset     Social Hx:   Social History   Socioeconomic History  . Marital status: Single    Spouse name: Not on file  . Number of children: Not on file  . Years of education: Not on file  . Highest education level: Not on file  Occupational History  . Not on file  Tobacco Use  . Smoking status: Current Every Day Smoker    Packs/day: 1.00    Years: 22.00    Pack years: 22.00    Types: Cigarettes  . Smokeless tobacco: Never Used  . Tobacco comment: Recently started a smoking cessation class.   Substance and Sexual Activity  . Alcohol use: Not Currently    Alcohol/week: 0.0 standard drinks    Comment:  hx alcohollism  in remission since 2014  . Drug use: No    Comment: hx  marijuana use  . Sexual activity: Yes    Partners: Male    Birth control/protection: Surgical  Other Topics Concern  . Not  on file  Social History Narrative   Former [facility] patient.      Was on disability at one point.   Return to the workforce.  40 hours a week at [employer], 10 hours a week on the weekends at [employer] in [place] at night 10 to 12 hours a week.      Has grown children, she lives alone with a pet, continues to smoke no alcohol or drug use at this time      History of EtOH abuse and THC use.         Social Determinants of Health   Financial Resource Strain:   . Difficulty of Paying Living Expenses:   Food Insecurity: No Food Insecurity  . Worried About Charity fundraiser in the Last Year: Never true  . Ran Out of Food in the Last Year: Never true  Transportation Needs: No Transportation Needs  . Lack of Transportation (Medical): No  . Lack of Transportation (Non-Medical): No  Physical Activity:   . Days of Exercise per Week:   . Minutes of Exercise per Session:   Stress:   . Feeling of Stress :   Social Connections:   . Frequency of Communication with Friends and Family:   . Frequency of Social Gatherings with Friends and Family:   . Attends Religious  Services:   . Active Member of Clubs or Organizations:   . Attends Archivist Meetings:   Marland Kitchen Marital Status:   Intimate Partner Violence:   . Fear of Current or Ex-Partner:   . Emotionally Abused:   Marland Kitchen Physically Abused:   . Sexually Abused:     Past Surgical Hx:  Past Surgical History:  Procedure Laterality Date  . CESAREAN SECTION  1995   w/  Bilateral Tubal Ligation  . COLONOSCOPY  last one 08-09-2013  . CYSTOSCOPY W/ URETERAL STENT PLACEMENT Left 03/29/2016   Procedure: CYSTOSCOPY WITH STENT REPLACEMENT;  Surgeon: Nickie Retort, MD;  Location: [facility];  Service: Urology;  Laterality: Left;  . CYSTOSCOPY WITH RETROGRADE PYELOGRAM, URETEROSCOPY AND STENT PLACEMENT Left 03/08/2016   Procedure: CYSTOSCOPY WITH  LEFT RETROGRADE PYELOGRAM, AND STENT PLACEMENT;  Surgeon: Nickie Retort, MD;  Location: [facility];  Service: Urology;  Laterality: Left;  . CYSTOSCOPY/RETROGRADE/URETEROSCOPY/STONE EXTRACTION WITH BASKET Left 03/29/2016   Procedure: CYSTOSCOPY/RETROGRADE/URETEROSCOPY/STONE EXTRACTION WITH BASKET;  Surgeon: Nickie Retort, MD;  Location: [facility];  Service: Urology;  Laterality: Left;  . ENDOMETRIAL ABLATION W/ NOVASURE  04-01-2010  . ESOPHAGOGASTRODUODENOSCOPY  last one 08-09-2013  . KNEE ARTHROSCOPY Left as teen  . LASER ABLATION OF THE CERVIX  2012 approx  . TRANSTHORACIC ECHOCARDIOGRAM  05-19-2006   lvsf normal, ef 55-65%, there was mild flattening of the interventricular septum during diastoli/  RV size at upper limits normal  . TUBAL LIGATION    . WISDOM TOOTH EXTRACTION  age 60 's    Past Medical Hx:  Past Medical History:  Diagnosis Date  . Bipolar 1 disorder ([place])   . Complication of anesthesia    wakes up during procedures  . GAD (generalized anxiety disorder)   . Genital HSV    currently per pt  no break out 03-22-2016   . GERD (gastroesophageal reflux disease)   . Hiatal hernia   . History of cervical  dysplasia    2012 laser ablation  . History of esophageal dilatation    for dysphasia -- x2 dilated  . History of  gastric ulcer   . History of Helicobacter pylori infection    remote hx  . History of hidradenitis suppurativa    "gets all over body intermittantly"    . History of hypertension    no issue since stopped drinking alcohol 2014  . History of kidney stones   . History of panic attacks   . Iron deficiency anemia   . Left ureteral stone   . OCD (obsessive compulsive disorder)   . PTSD (post-traumatic stress disorder)   . Recovering alcoholic in remission ([place])    since 2014  . RLS (restless legs syndrome)   . Smokers' cough ([place])   . Urgency of urination   . Yeast infection involving the vagina and surrounding area    secondary to taking antibiotic    Past Gynecological History:  See HPI No LMP recorded. Patient has had an ablation.  Family Hx:  Family History  Problem Relation Age of Onset  . Heart disease Father   . Lung cancer Father   . Alcohol abuse Father   . Heart disease Mother   . Depression Mother   . Anxiety disorder Mother   . Drug abuse Brother   . Alcohol abuse Brother   . Drug abuse Brother   . ADD / ADHD Brother   . Colon polyps Brother   . Stomach cancer Paternal Grandfather   . Diabetes Maternal Grandfather   . Diabetes Paternal Grandmother   . Kidney disease Maternal Uncle   . Cirrhosis Cousin        alcoholic  . Anxiety disorder Maternal Aunt   . Depression Maternal Aunt   . Cancer Cousin        cervical cancer    Review of Systems:  Constitutional  Feels well, poor appetite. ENT Normal appearing ears and nares bilaterally Skin/Breast  No rash, sores, jaundice, itching, dryness Cardiovascular  No chest pain, shortness of breath, or edema  Pulmonary  No cough or wheeze.  Gastro Intestinal  No nausea, vomitting, or diarrhoea. No bright red blood per rectum, no abdominal pain, change in bowel movement, or constipation.   Genito Urinary  No frequency, urgency, dysuria, + posterior vulvar irritation.  Musculo Skeletal  No myalgia, arthralgia, joint swelling or pain  Neurologic  No weakness, numbness, change in gait,  Psychology  No depression, anxiety, insomnia.   Vitals:  Blood pressure 136/77, pulse (!) 115, temperature 99.3 F (37.4 C), temperature source Oral, resp. rate 18, height 5' 5.5" (1.664 m), weight 218 lb 1.6 oz (98.9 kg), SpO2 100 %.  Physical Exam: WD in NAD Neck  Supple NROM, without any enlargements.  Lymph Node Survey No cervical supraclavicular or inguinal adenopathy Cardiovascular  Pulse normal rate, regularity and rhythm. S1 and S2 normal.  Lungs  Clear to auscultation bilateraly, without wheezes/crackles/rhonchi. Good air movement.  Skin  No rash/lesions/breakdown  Psychiatry  Alert and oriented to person, place, and time  Abdomen  Normoactive bowel sounds, abdomen soft, non-tender and mildly obese without evidence of hernia. Back No CVA tenderness Genito Urinary  Vulva/vagina: 4.5cm ulcerated warty lesion in the midline perineum, extending to the deeper tissues, approximating anal sphincter, but not palpably invasive to the anus or rectum. Not fixed.   Bladder/urethra:  No lesions or masses, well supported bladder  Vagina: smooth, no lesions. Vulvar lesion does not extend into vagina.  Cervix: Normal appearing, no lesions.  Uterus: Small, mobile, no parametrial involvement or nodularity.  Adnexa: no palpable masses. Rectal  Good tone, no  masses no cul de sac nodularity. Lesion does not appear to infiltrate through to rectum or anal mucosa. Extremities  No bilateral cyanosis, clubbing or edema.   Sandra Solo, MD  01/29/2020, 11:37 AM

## 2020-01-29 NOTE — Patient Instructions (Addendum)
Dr Denman George feels that you have a stage IB vulvar cancer.  She has scheduled you to see Dr Margaretmary Bayley at [facility] on 6/1/21at 1:30 pm and  for surgery on 02/10/20. She is recommending a radical posterior vulvectomy with sentinel lymph node biopsy.  Before your surgery, you will have a PET scan to evaluate for disease spread into the lymph nodes. The PET Scan is scheduled for Friday 02-07-20 at [facility] radiology at 12 noon. You need to arrive at 1130 am. You may only drink water after 5:30 am on 02-07-20.  Do not have carbohydrates for dinner on Thursday 02-06-20.   Dr Serita Grit office can be reached at [phone].   Dr Thurston Pounds is located at [address]  [address]

## 2020-01-30 ENCOUNTER — Other Ambulatory Visit (HOSPITAL_COMMUNITY): Payer: Self-pay | Admitting: Psychiatry

## 2020-01-30 DIAGNOSIS — F411 Generalized anxiety disorder: Secondary | ICD-10-CM

## 2020-01-31 ENCOUNTER — Telehealth: Payer: Self-pay | Admitting: *Deleted

## 2020-01-31 NOTE — Telephone Encounter (Signed)
Fax FMLA paperwork to [facility]

## 2020-02-02 MED FILL — LORazepam 1 MG TABS: 1 | 30 days supply | Qty: 90 | Fill #0

## 2020-02-04 LAB — SURGICAL PATHOLOGY

## 2020-02-05 ENCOUNTER — Telehealth: Payer: Self-pay | Admitting: *Deleted

## 2020-02-05 ENCOUNTER — Encounter: Payer: Self-pay | Admitting: Oncology

## 2020-02-05 DIAGNOSIS — C519 Malignant neoplasm of vulva, unspecified: Secondary | ICD-10-CM

## 2020-02-05 NOTE — Telephone Encounter (Signed)
Sandra Miller called to see how she should follow up after having an appoint with Dr. Thurston Pounds at [facility] yesterday. I let her know that someone from our office will be in touch to coordinate her care.

## 2020-02-05 NOTE — Progress Notes (Signed)
Called Sandra Miller and advised her that a referral to Radiation Oncology has been placed and that she will receive a call with an appointment.  She verbalized understanding and agreement.

## 2020-02-07 ENCOUNTER — Ambulatory Visit (HOSPITAL_COMMUNITY)
Admission: RE | Admit: 2020-02-07 | Discharge: 2020-02-07 | Disposition: A | Discharge status: Home / Self Care | Payer: No Typology Code available for payment source | Source: Ambulatory Visit | Attending: Gynecologic Oncology | Admitting: Gynecologic Oncology

## 2020-02-07 ENCOUNTER — Other Ambulatory Visit: Payer: Self-pay

## 2020-02-07 DIAGNOSIS — C519 Malignant neoplasm of vulva, unspecified: Secondary | ICD-10-CM

## 2020-02-07 LAB — GLUCOSE, CAPILLARY: Glucose-Capillary: 91 mg/dL (ref 70–99)

## 2020-02-07 MED ORDER — FLUDEOXYGLUCOSE F - 18 (FDG) INJECTION
11.0000 | Freq: Once | INTRAVENOUS | Status: AC | PRN
  Administered 2020-02-07: 11 via INTRAVENOUS

## 2020-02-10 ENCOUNTER — Telehealth: Payer: Self-pay | Admitting: Oncology

## 2020-02-10 DIAGNOSIS — N6320 Unspecified lump in the left breast, unspecified quadrant: Secondary | ICD-10-CM

## 2020-02-10 NOTE — Telephone Encounter (Signed)
Left a message regarding PET scan results.  Requested a return call.

## 2020-02-10 NOTE — Telephone Encounter (Signed)
La called back and was advised of the PET scan results.  She said she had felt a lump in her left breast 2 weeks ago and had a mammogram scheduled for 02/06/20 that she canceled because she was having the PET on 02/08/20.  She also said she has felt a lump under her neck on the right side.  Advised her that we have ordered a diagnostic mammogram and ultrasound at the [place] and will call her back with the appointment.    Called back with the appointment for 02/18/20 at 8:50 (8:30 arrival) at the [place] [facility].  Jadine verbalized understanding and agreement.  Also called Terie Purser, Navigator at the [facility] and advised her of patien'ts PET results and mammogram date.

## 2020-02-12 ENCOUNTER — Telehealth: Payer: Self-pay | Admitting: *Deleted

## 2020-02-12 NOTE — Telephone Encounter (Signed)
[employer] form for Leave of absence intake number [id number] received by GYN/Oncology.brought to this nurse for completion; patient to receive chemotherapy and radiation.   New consult for radiation scheduled 03/11/2020.  Requesting clarification of what specialty and provider for [facility] requested.

## 2020-02-12 NOTE — Telephone Encounter (Signed)
Voicemail left for examiner Kaylyn Lim.  Awaiting return call from examiner and patient.

## 2020-02-13 ENCOUNTER — Telehealth: Payer: Self-pay | Admitting: Oncology

## 2020-02-13 NOTE — Telephone Encounter (Signed)
Called Sandra Miller and advised her that [place] is not able to complete her FMLA/STD forms since she did not have surgery.  Advised her to contact her Psychiatrist to see if her forms can be completed for her anxiety.  Also advised her to contact her [employer] representative to see if her deadline can be extended from Monday.

## 2020-02-13 NOTE — Telephone Encounter (Signed)
Called Sandra Miller and rescheduled the appointment for Radiation Oncology to 02/19/20 per Dr. Sondra Come. She verbalized understanding and agreement.

## 2020-02-14 NOTE — Telephone Encounter (Signed)
Form returned to GYN Navigator with the following information from patient.  No return call from [employer].   "I have been nauseated, emotionally sick, leaving work early, medication increased for anxiety to prevent panic attacks.  My last day of work was 02/03/2020.  Surgery 02/10/2020 was cancelled to receive radiation.  Did not think it would take a month for radiation appointment.  Received E-mail that short term disability is approved so I thought leave was also approved.  Let me know if I need to try to work.  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

## 2020-02-17 ENCOUNTER — Other Ambulatory Visit: Payer: Self-pay | Admitting: Oncology

## 2020-02-17 ENCOUNTER — Other Ambulatory Visit: Payer: Self-pay | Admitting: *Deleted

## 2020-02-17 ENCOUNTER — Encounter: Payer: Self-pay | Admitting: Oncology

## 2020-02-17 NOTE — Progress Notes (Signed)
Gynecologic Oncology Multi-Disciplinary Disposition Conference Note  Date of the Conference: 02/17/2020  Patient Name: Sandra Miller  Referring Provider: Dr. Kennon Rounds Primary GYN Oncologist: Dr. Denman George  Stage/Disposition:  Stage IB squamous cell carcinoma of the vulva. Disposition is for radiation and for imaging (diagnostic mammogram/ultrasound of left breast mass/hypermetabolic left axillary lymph nodes).  Also referral to [facility] breast cancer navigators to assist with coordination of care.   This Multidisciplinary conference took place involving physicians from [place], [place], Radiation Oncology, Pathology, Radiology along with the Gynecologic Oncology Nurse Practitioner and RN.  Comprehensive assessment of the patient's malignancy, staging, need for surgery, chemotherapy, radiation therapy, and need for further testing were reviewed. Supportive measures, both inpatient and following discharge were also discussed. The recommended plan of care is documented. Greater than 35 minutes were spent correlating and coordinating this patient's care.

## 2020-02-18 ENCOUNTER — Ambulatory Visit
Admission: RE | Admit: 2020-02-18 | Discharge: 2020-02-18 | Disposition: A | Discharge status: Home / Self Care | Payer: No Typology Code available for payment source | Source: Ambulatory Visit | Attending: Gynecologic Oncology | Admitting: Gynecologic Oncology

## 2020-02-18 ENCOUNTER — Other Ambulatory Visit: Payer: Self-pay | Admitting: Gynecologic Oncology

## 2020-02-18 ENCOUNTER — Other Ambulatory Visit: Payer: Self-pay

## 2020-02-18 ENCOUNTER — Telehealth: Payer: Self-pay | Admitting: Oncology

## 2020-02-18 DIAGNOSIS — R921 Mammographic calcification found on diagnostic imaging of breast: Secondary | ICD-10-CM

## 2020-02-18 DIAGNOSIS — N6320 Unspecified lump in the left breast, unspecified quadrant: Secondary | ICD-10-CM

## 2020-02-18 DIAGNOSIS — R599 Enlarged lymph nodes, unspecified: Secondary | ICD-10-CM

## 2020-02-18 NOTE — Telephone Encounter (Signed)
Called Sandra Miller and advised her that the appointment with Dr. Sondra Come tomorrow has been canceled and that we will reschedule once we know the plan for her breast mass.  Lamyra verbalized agreement and understanding.  She said she is having biopsies today and will see the surgeon on Friday.

## 2020-02-19 ENCOUNTER — Telehealth: Payer: Self-pay | Admitting: Oncology

## 2020-02-19 ENCOUNTER — Institutional Professional Consult (permissible substitution): Payer: No Typology Code available for payment source | Admitting: Radiation Oncology

## 2020-02-19 NOTE — Telephone Encounter (Signed)
[place] called and asked for her FMLA/disability paperwork to be faxed to her psychiatrist's office at [phone].  Also she said she was just notified of her pathology from her biopsy yesterday.  Advised her of appointment with Dr. Lindi Adie on 02/25/20 and that we will reschedule her with Dr. Sondra Come soon.  She verbalized understanding and agreement.

## 2020-02-20 ENCOUNTER — Other Ambulatory Visit: Payer: Self-pay

## 2020-02-20 ENCOUNTER — Ambulatory Visit
Admission: RE | Admit: 2020-02-20 | Discharge: 2020-02-20 | Disposition: A | Discharge status: Home / Self Care | Payer: No Typology Code available for payment source | Source: Ambulatory Visit | Attending: Gynecologic Oncology | Admitting: Gynecologic Oncology

## 2020-02-20 ENCOUNTER — Other Ambulatory Visit: Payer: Self-pay | Admitting: Gynecologic Oncology

## 2020-02-20 DIAGNOSIS — R921 Mammographic calcification found on diagnostic imaging of breast: Secondary | ICD-10-CM

## 2020-02-21 ENCOUNTER — Other Ambulatory Visit: Payer: Self-pay | Admitting: Surgery

## 2020-02-24 ENCOUNTER — Other Ambulatory Visit: Payer: Self-pay

## 2020-02-24 ENCOUNTER — Telehealth (INDEPENDENT_AMBULATORY_CARE_PROVIDER_SITE_OTHER): Payer: No Typology Code available for payment source | Admitting: Psychiatry

## 2020-02-24 ENCOUNTER — Encounter (HOSPITAL_COMMUNITY): Payer: Self-pay | Admitting: Psychiatry

## 2020-02-24 ENCOUNTER — Other Ambulatory Visit (HOSPITAL_COMMUNITY): Payer: No Typology Code available for payment source

## 2020-02-24 DIAGNOSIS — F411 Generalized anxiety disorder: Secondary | ICD-10-CM

## 2020-02-24 NOTE — Progress Notes (Signed)
Virtual Visit via Telephone Note  I connected with [place] on 02/24/20 at  9:40 AM EDT by telephone and verified that I am speaking with the correct person using two identifiers.   I discussed the limitations, risks, security and privacy concerns of performing an evaluation and management service by telephone and the availability of in person appointments. I also discussed with the patient that there may be a patient responsible charge related to this service. The patient expressed understanding and agreed to proceed.   Patient location; home Provider location; home office  History of Present Illness: Patient requested an earlier appointment because she is very sad depressed and having crying spells.  She had a PET scan and find out that she not only have vulvar carcinoma but also she had breast cancer and positive lymph node.  She has been very busy Diplomatic Services operational officer, OB/GYN and oncologist.  She is very sad depressed and fearful.  She is thinking about dying because of the cancer.  She is not sleeping well and having racing thoughts.  Her sister is taking her to the doctor's appointment.  She had a good support from her family.  Her mother is staying with her and her children comes every day.  She find out this news and since then she has not able to go back to work.  Her last date of the work was May 31.  Patient told that she cannot focus and most of the time.  She is sleeping or crying.  At night she has struggle to keep her thoughts together.  She is having racing thoughts, poor sleep and anxiety.  Her treatment has not began.  She tried to get FMLA from oncologist and surgeon but since treatment has not started they have not approved FMLA.  She has appointment in first week of July to put a port for chemotherapy.  She had lost 7 pounds since she received the news.  Though she denies any hallucination, mania, paranoia, suicidal thoughts but her anxiety is very high.  She is taking all her  medication.  She is taking venlafaxine, lorazepam, olanzapine.  Her energy level is low.  Her appetite is decreased.  She has no tremors, shakes or any EPS.  Past Psychiatric History:Reviewed. H/Obipolar disorder, alcohol dependence and marijuana abuse. H/Oat least 6inpatient.Triedlithium, Seroquel, Lamictal, Paxil, Celexa, Abilify, Geodon,Klonopin,Depakote, Neurontin and Cymbalta. No H/ohistory of suicidal attempt.  Psychiatric Specialty Exam: Physical Exam  Review of Systems  There were no vitals taken for this visit.There is no height or weight on file to calculate BMI.  General Appearance: NA  Eye Contact:  NA  Speech:  Slow  Volume:  Decreased  Mood:  Anxious, Depressed and Hopeless  Affect:  NA  Thought Process:  Descriptions of Associations: Intact  Orientation:  Full (Time, Place, and Person)  Thought Content:  Rumination  Suicidal Thoughts:  No  Homicidal Thoughts:  No  Memory:  Immediate;   Good Recent;   Good Remote;   Good  Judgement:  Intact  Insight:  Present  Psychomotor Activity:  NA  Concentration:  Concentration: Fair and Attention Span: Fair  Recall:  Good  Fund of Knowledge:  Good  Language:  Good  Akathisia:  No  Handed:  Right  AIMS (if indicated):     Assets:  Communication Skills Desire for Improvement Housing  ADL's:  Intact  Cognition:  WNL  Sleep:   fair, racing thought      Assessment and Plan: Bipolar disorder  type I.  Generalized anxiety disorder.  Patient anxiety is increased since she received the news that her cancer is not limited to vulvar but also she had positive lymph node and breast cancer.  She has not back to work since June 1 and requesting FMLA.  Her treatment is not started so her OB/GYN, oncology and surgeon had not approved FMLA.  I do believe she need time off as she not able to function because of severe anxiety and depression.  Despite taking multiple medication she still have racing thoughts, fear of dying  anxiety.  I offered therapy but at this time patient like to focus on her health and like to keep appointment with multiple providers for ongoing health issues.  I agree.  We will take her out on FMLA starting June 1-July 5.  Reassurance given.  I recommend call us back if she feels she needs therapy appointment.  No change in the medication.  Continue Topamax 25 mg at bedtime, olanzapine 15 mg at bedtime, trazodone 100 mg at bedtime, lorazepam 1 mg 3 times a day and venlafaxine 112.5 mg daily.  Follow-up in 4 weeks.  Time spent 20 minutes.  Follow Up Instructions:    I discussed the assessment and treatment plan with the patient. The patient was provided an opportunity to ask questions and all were answered. The patient agreed with the plan and demonstrated an understanding of the instructions.   The patient was advised to call back or seek an in-person evaluation if the symptoms worsen or if the condition fails to improve as anticipated.  I provided 20 minutes of non-face-to-face time during this encounter.   Kathlee Nations, MD

## 2020-02-24 NOTE — Progress Notes (Signed)
[facility] NOTE  Patient Care Team: Rutherford Guys, MD as PCP - General (Family Medicine)  CHIEF COMPLAINTS/PURPOSE OF CONSULTATION:  Newly diagnosed breast cancer  HISTORY OF PRESENTING ILLNESS:  Sandra Miller 44 y.o. female is here because of recent diagnosis of left breast cancer. PET scan on 02/07/20 following a diagnosis of vulvar cancer showed a 1.7cm left breast mass and mildly hypermetabolic left axillary lymph nodes. Mammogram and [place] on 02/18/20 showed a 3.1cm left breast mass at the 11 o'clock position, palpable on exam, and a single abnormal left axillary lymph node with cortical thickening. Left breast biopsy on 02/20/20 showed invasive and in situ carcinoma in the breast and axilla, grade 3, HER-2 positive (3+), ER/PR+ 95%, Ki67 75%. She presents to the clinic today for initial evaluation and discussion of treatment options.    I reviewed her records extensively and collaborated the history with the patient.  SUMMARY OF ONCOLOGIC HISTORY: Oncology History  Vulvar cancer ([place])  01/29/2020 Initial Diagnosis   Vulvar cancer ([place])   Malignant neoplasm of upper-inner quadrant of left breast in female, estrogen receptor positive ([place])  02/20/2020 Initial Diagnosis   PET scan on 02/07/20 following a diagnosis of vulvar cancer showed a 1.7cm left breast mass and mildly hypermetabolic left axillary lymph nodes. Mammogram and [place] on 02/18/20 showed a 3.1cm left breast mass at the 11 o'clock position, palpable on exam, and a single abnormal left axillary lymph node with cortical thickening. Left breast biopsy on 02/20/20 showed invasive and in situ carcinoma in the breast and axilla, grade 3, HER-2 positive (3+), ER/PR+ 95%, Ki67 75%     MEDICAL HISTORY:  Past Medical History:  Diagnosis Date  . Bipolar 1 disorder ([place])   . Complication of anesthesia    wakes up during procedures  . GAD (generalized anxiety disorder)   . Genital HSV    currently per pt  no break  out 03-22-2016   . GERD (gastroesophageal reflux disease)   . Hiatal hernia   . History of cervical dysplasia    2012 laser ablation  . History of esophageal dilatation    for dysphasia -- x2 dilated  . History of gastric ulcer   . History of Helicobacter pylori infection    remote hx  . History of hidradenitis suppurativa    "gets all over body intermittantly"    . History of hypertension    no issue since stopped drinking alcohol 2014  . History of kidney stones   . History of panic attacks   . Iron deficiency anemia   . Left ureteral stone   . OCD (obsessive compulsive disorder)   . PTSD (post-traumatic stress disorder)   . Recovering alcoholic in remission ([place])    since 2014  . RLS (restless legs syndrome)   . Smokers' cough ([place])   . Urgency of urination   . Yeast infection involving the vagina and surrounding area    secondary to taking antibiotic    SURGICAL HISTORY: Past Surgical History:  Procedure Laterality Date  . CESAREAN SECTION  1995   w/  Bilateral Tubal Ligation  . COLONOSCOPY  last one 08-09-2013  . CYSTOSCOPY W/ URETERAL STENT PLACEMENT Left 03/29/2016   Procedure: CYSTOSCOPY WITH STENT REPLACEMENT;  Surgeon: Nickie Retort, MD;  Location: [facility];  Service: Urology;  Laterality: Left;  . CYSTOSCOPY WITH RETROGRADE PYELOGRAM, URETEROSCOPY AND STENT PLACEMENT Left 03/08/2016   Procedure: CYSTOSCOPY WITH  LEFT RETROGRADE PYELOGRAM, AND STENT  PLACEMENT;  Surgeon: Nickie Retort, MD;  Location: [facility];  Service: Urology;  Laterality: Left;  . CYSTOSCOPY/RETROGRADE/URETEROSCOPY/STONE EXTRACTION WITH BASKET Left 03/29/2016   Procedure: CYSTOSCOPY/RETROGRADE/URETEROSCOPY/STONE EXTRACTION WITH BASKET;  Surgeon: Nickie Retort, MD;  Location: [facility];  Service: Urology;  Laterality: Left;  . ENDOMETRIAL ABLATION W/ NOVASURE  04-01-2010  . ESOPHAGOGASTRODUODENOSCOPY  last one 08-09-2013  . KNEE ARTHROSCOPY Left as teen   . LASER ABLATION OF THE CERVIX  2012 approx  . TRANSTHORACIC ECHOCARDIOGRAM  05-19-2006   lvsf normal, ef 55-65%, there was mild flattening of the interventricular septum during diastoli/  RV size at upper limits normal  . TUBAL LIGATION    . WISDOM TOOTH EXTRACTION  age 57 's    SOCIAL HISTORY: Social History   Socioeconomic History  . Marital status: Single    Spouse name: Not on file  . Number of children: Not on file  . Years of education: Not on file  . Highest education level: Not on file  Occupational History  . Not on file  Tobacco Use  . Smoking status: Current Every Day Smoker    Packs/day: 1.00    Years: 22.00    Pack years: 22.00    Types: Cigarettes  . Smokeless tobacco: Never Used  . Tobacco comment: Recently started a smoking cessation class.   Vaping Use  . Vaping Use: Never used  Substance and Sexual Activity  . Alcohol use: Not Currently    Alcohol/week: 0.0 standard drinks    Comment:  hx alcohollism  in remission since 2014  . Drug use: No    Comment: hx  marijuana use  . Sexual activity: Yes    Partners: Male    Birth control/protection: Surgical  Other Topics Concern  . Not on file  Social History Narrative   Former [facility] patient.      Was on disability at one point.   Return to the workforce.  40 hours a week at [employer], 10 hours a week on the weekends at [employer] in [place] at night 10 to 12 hours a week.      Has grown children, she lives alone with a pet, continues to smoke no alcohol or drug use at this time      History of EtOH abuse and THC use.         Social Determinants of Health   Financial Resource Strain:   . Difficulty of Paying Living Expenses:   Food Insecurity: No Food Insecurity  . Worried About Charity fundraiser in the Last Year: Never true  . Ran Out of Food in the Last Year: Never true  Transportation Needs: No Transportation Needs  . Lack of Transportation (Medical): No  . Lack  of Transportation (Non-Medical): No  Physical Activity:   . Days of Exercise per Week:   . Minutes of Exercise per Session:   Stress:   . Feeling of Stress :   Social Connections:   . Frequency of Communication with Friends and Family:   . Frequency of Social Gatherings with Friends and Family:   . Attends Religious Services:   . Active Member of Clubs or Organizations:   . Attends Archivist Meetings:   Marland Kitchen Marital Status:   Intimate Partner Violence:   . Fear of Current or Ex-Partner:   . Emotionally Abused:   Marland Kitchen Physically Abused:   . Sexually Abused:     FAMILY HISTORY: Family History  Problem Relation Age of Onset  . Heart disease Father   . Lung cancer Father   . Alcohol abuse Father   . Heart disease Mother   . Depression Mother   . Anxiety disorder Mother   . Drug abuse Brother   . Alcohol abuse Brother   . Drug abuse Brother   . ADD / ADHD Brother   . Colon polyps Brother   . Stomach cancer Paternal Grandfather   . Diabetes Maternal Grandfather   . Diabetes Paternal Grandmother   . Kidney disease Maternal Uncle   . Cirrhosis Cousin        alcoholic  . Anxiety disorder Maternal Aunt   . Depression Maternal Aunt   . Cancer Cousin        cervical cancer    ALLERGIES:  is allergic to depakote [divalproex sodium], minocycline, and aspirin.  MEDICATIONS:  Current Outpatient Medications  Medication Sig Dispense Refill  . acetaminophen (TYLENOL) 500 MG tablet Take 1,000 mg by mouth every 6 (six) hours as needed for moderate pain or fever.    . gabapentin (NEURONTIN) 300 MG capsule Take 1 capsule (300 mg total) by mouth daily. 90 capsule 3  . lisinopril-hydrochlorothiazide (ZESTORETIC) 20-12.5 MG tablet Take 1 tablet by mouth daily. 90 tablet 1  . LORazepam (ATIVAN) 1 MG tablet Take 1 tablet (1 mg total) by mouth 3 (three) times daily as needed for anxiety. 90 tablet 0  . Multiple Vitamin (MULTIVITAMIN) tablet Take 1 tablet by mouth daily.    . nicotine  (NICODERM CQ - DOSED IN MG/24 HOURS) 21 mg/24hr patch Place 1 patch (21 mg total) onto the skin daily. 28 patch 0  . OLANZapine (ZYPREXA) 15 MG tablet Take 1 tablet (15 mg total) by mouth at bedtime. 90 tablet 0  . omeprazole (PRILOSEC) 40 MG capsule TAKE 1 CAPSULE(40 MG) BY MOUTH TWICE DAILY BEFORE A MEAL 180 capsule 0  . Probiotic Product (PROBIOTIC DAILY PO) Take by mouth.    . topiramate (TOPAMAX) 25 MG tablet Take 1 tablet (25 mg total) by mouth at bedtime. 90 tablet 0  . traZODone (DESYREL) 100 MG tablet Take 1 tablet (100 mg total) by mouth at bedtime. 90 tablet 0  . venlafaxine XR (EFFEXOR XR) 37.5 MG 24 hr capsule Take three capsule (112.5 mg total) by mouth daily. 90 capsule 0   No current facility-administered medications for this visit.    REVIEW OF SYSTEMS:   Constitutional: Denies fevers, chills or abnormal night sweats Eyes: Denies blurriness of vision, double vision or watery eyes Ears, nose, mouth, throat, and face: Denies mucositis or sore throat Respiratory: Denies cough, dyspnea or wheezes Cardiovascular: Denies palpitation, chest discomfort or lower extremity swelling Gastrointestinal:  Denies nausea, heartburn or change in bowel habits Skin: Denies abnormal skin rashes Lymphatics: Denies new lymphadenopathy or easy bruising Neurological:Denies numbness, tingling or new weaknesses Behavioral/Psych: Mood is stable, no new changes  Breast: Palpable left breast mass All other systems were reviewed with the patient and are negative.  PHYSICAL EXAMINATION: ECOG PERFORMANCE STATUS: 1 - Symptomatic but completely ambulatory  Vitals:   02/25/20 1546  BP: 137/75  Pulse: (!) 106  Resp: 17  Temp: 98.7 F (37.1 C)  SpO2: 100%   Filed Weights   02/25/20 1546  Weight: 212 lb 12.8 oz (96.5 kg)    GENERAL:alert, no distress and comfortable SKIN: skin color, texture, turgor are normal, no rashes or significant lesions EYES: normal, conjunctiva are pink and  non-injected, sclera clear OROPHARYNX:no  exudate, no erythema and lips, buccal mucosa, and tongue normal  NECK: supple, thyroid normal size, non-tender, without nodularity LYMPH:  no palpable lymphadenopathy in the cervical, axillary or inguinal LUNGS: clear to auscultation and percussion with normal breathing effort HEART: regular rate & rhythm and no murmurs and no lower extremity edema ABDOMEN:abdomen soft, non-tender and normal bowel sounds Musculoskeletal:no cyanosis of digits and no clubbing  PSYCH: alert & oriented x 3 with fluent speech NEURO: no focal motor/sensory deficits    LABORATORY DATA:  I have reviewed the data as listed Lab Results  Component Value Date   WBC 9.4 07/09/2018   HGB 12.9 07/09/2018   HCT 37.2 07/09/2018   MCV 84 07/09/2018   PLT 356 07/09/2018   Lab Results  Component Value Date   NA 141 10/09/2018   K 4.1 10/09/2018   CL 103 10/09/2018   CO2 22 10/09/2018    RADIOGRAPHIC STUDIES: I have personally reviewed the radiological reports and agreed with the findings in the report.  ASSESSMENT AND PLAN:  Malignant neoplasm of upper-inner quadrant of left breast in female, estrogen receptor positive ([place]) 02/20/2020:PET scan on 02/07/20 following a diagnosis of vulvar cancer showed a 1.7cm left breast mass and mildly hypermetabolic left axillary lymph nodes. Mammogram and [place] on 02/18/20 showed a 3.1cm left breast mass at the 11 o'clock position, palpable on exam, and a single abnormal left axillary lymph node with cortical thickening. Left breast biopsy on 02/20/20 showed invasive and in situ carcinoma in the breast and axilla, grade 3, HER-2 positive (3+), ER/PR+ 95%, Ki67 75%  Pathology and radiology counseling: Discussed with the patient, the details of pathology including the type of breast cancer,the clinical staging, the significance of ER, PR and HER-2/neu receptors and the implications for treatment. After reviewing the pathology in detail, we proceeded  to discuss the different treatment options between surgery, radiation, chemotherapy, antiestrogen therapies.  Recommendation based on multidisciplinary tumor board: 1. Neoadjuvant chemotherapy with TCH Perjeta 6 cycles followed by Herceptin maintenance for 1 year 2. Followed by breast conserving surgery if possible with sentinel lymph node study 3. Followed by adjuvant radiation therapy if patient had lumpectomy  Chemotherapy Counseling: I discussed the risks and benefits of chemotherapy including the risks of nausea/ vomiting, risk of infection from low WBC count, fatigue due to chemo or anemia, bruising or bleeding due to low platelets, mouth sores, loss/ change in taste and decreased appetite. Liver and kidney function will be monitored through out chemotherapy as abnormalities in liver and kidney function may be a side effect of treatment. Cardiac dysfunction due to Herceptin and Perjeta were discussed in detail. Risk of permanent bone marrow dysfunction due to chemo were also discussed.  Plan: 1. Port placement 2. Echocardiogram 3. Chemotherapy class 4. Breast MRI 5.  Cervical lymph node: We will obtain a biopsy of the lymph node. 6.  Genetics consultation ------------------------------------------------------------------- Vulvar squamous cell carcinoma: Patient met with [facility] GYN oncology who suggested that she needs to receive neoadjuvant chemo with radiation (chemo as a radiosensitizer) and then subsequently undergo surgical resection.  My recommendation would be to start off with vulvar radiation along with carboplatin and Herceptin followed by vulvar surgery.  Subsequently she can start neoadjuvant chemo for breast cancer.  I will discussed with Dr. Denman George and Dr. [name].  All questions were answered. The patient knows to call the clinic with any problems, questions or concerns.   Rulon Eisenmenger, MD, MPH 02/25/2020    I, Molly Dorshimer, am  acting as scribe for Nicholas Lose,  MD.  I have reviewed the above documentation for accuracy and completeness, and I agree with the above.

## 2020-02-25 ENCOUNTER — Other Ambulatory Visit: Payer: Self-pay | Admitting: Otolaryngology

## 2020-02-25 ENCOUNTER — Other Ambulatory Visit: Payer: Self-pay

## 2020-02-25 ENCOUNTER — Inpatient Hospital Stay
Payer: No Typology Code available for payment source | Attending: Gynecologic Oncology | Admitting: Hematology and Oncology

## 2020-02-25 DIAGNOSIS — F17210 Nicotine dependence, cigarettes, uncomplicated: Secondary | ICD-10-CM | POA: Diagnosis not present

## 2020-02-25 DIAGNOSIS — Z170 Estrogen receptor positive status [ER+]: Secondary | ICD-10-CM | POA: Insufficient documentation

## 2020-02-25 DIAGNOSIS — C50212 Malignant neoplasm of upper-inner quadrant of left female breast: Secondary | ICD-10-CM | POA: Insufficient documentation

## 2020-02-25 DIAGNOSIS — Z808 Family history of malignant neoplasm of other organs or systems: Secondary | ICD-10-CM | POA: Insufficient documentation

## 2020-02-25 DIAGNOSIS — Z800 Family history of malignant neoplasm of digestive organs: Secondary | ICD-10-CM | POA: Diagnosis not present

## 2020-02-25 DIAGNOSIS — C50919 Malignant neoplasm of unspecified site of unspecified female breast: Secondary | ICD-10-CM

## 2020-02-25 DIAGNOSIS — C519 Malignant neoplasm of vulva, unspecified: Secondary | ICD-10-CM | POA: Insufficient documentation

## 2020-02-25 DIAGNOSIS — Z801 Family history of malignant neoplasm of trachea, bronchus and lung: Secondary | ICD-10-CM | POA: Diagnosis not present

## 2020-02-25 NOTE — Assessment & Plan Note (Signed)
02/20/2020:PET scan on 02/07/20 following a diagnosis of vulvar cancer showed a 1.7cm left breast mass and mildly hypermetabolic left axillary lymph nodes. Mammogram and [place] on 02/18/20 showed a 3.1cm left breast mass at the 11 o'clock position, palpable on exam, and a single abnormal left axillary lymph node with cortical thickening. Left breast biopsy on 02/20/20 showed invasive and in situ carcinoma in the breast and axilla, grade 3, HER-2 positive (3+), ER/PR+ 95%, Ki67 75%  Pathology and radiology counseling: Discussed with the patient, the details of pathology including the type of breast cancer,the clinical staging, the significance of ER, PR and HER-2/neu receptors and the implications for treatment. After reviewing the pathology in detail, we proceeded to discuss the different treatment options between surgery, radiation, chemotherapy, antiestrogen therapies.  Recommendation based on multidisciplinary tumor board: 1. Neoadjuvant chemotherapy with TCH Perjeta 6 cycles followed by Herceptin maintenance for 1 year 2. Followed by breast conserving surgery if possible with sentinel lymph node study 3. Followed by adjuvant radiation therapy if patient had lumpectomy  Chemotherapy Counseling: I discussed the risks and benefits of chemotherapy including the risks of nausea/ vomiting, risk of infection from low WBC count, fatigue due to chemo or anemia, bruising or bleeding due to low platelets, mouth sores, loss/ change in taste and decreased appetite. Liver and kidney function will be monitored through out chemotherapy as abnormalities in liver and kidney function may be a side effect of treatment. Cardiac dysfunction due to Herceptin and Perjeta were discussed in detail. Risk of permanent bone marrow dysfunction due to chemo were also discussed.  Plan: 1. Port placement 2. Echocardiogram 3. Chemotherapy class 4. Breast MRI 5. CT chest abdomen pelvis and bone scan for staging  Return to clinic in 2  weeks to start chemotherapy.

## 2020-02-26 ENCOUNTER — Other Ambulatory Visit: Payer: Self-pay | Admitting: *Deleted

## 2020-02-27 ENCOUNTER — Telehealth: Payer: Self-pay | Admitting: Hematology and Oncology

## 2020-02-27 ENCOUNTER — Telehealth: Payer: Self-pay | Admitting: *Deleted

## 2020-02-27 ENCOUNTER — Other Ambulatory Visit: Payer: Self-pay | Admitting: Hematology and Oncology

## 2020-02-27 ENCOUNTER — Encounter: Payer: Self-pay | Admitting: *Deleted

## 2020-02-27 ENCOUNTER — Ambulatory Visit
Admission: RE | Admit: 2020-02-27 | Discharge: 2020-02-27 | Disposition: A | Discharge status: Home / Self Care | Payer: No Typology Code available for payment source | Source: Ambulatory Visit | Attending: Otolaryngology | Admitting: Otolaryngology

## 2020-02-27 ENCOUNTER — Ambulatory Visit
Admission: RE | Admit: 2020-02-27 | Discharge: 2020-02-27 | Disposition: A | Discharge status: Home / Self Care | Payer: No Typology Code available for payment source | Source: Ambulatory Visit

## 2020-02-27 ENCOUNTER — Telehealth (HOSPITAL_COMMUNITY): Payer: No Typology Code available for payment source | Admitting: Psychiatry

## 2020-02-27 ENCOUNTER — Other Ambulatory Visit: Payer: Self-pay | Admitting: *Deleted

## 2020-02-27 ENCOUNTER — Telehealth: Payer: Self-pay | Admitting: Oncology

## 2020-02-27 DIAGNOSIS — C50919 Malignant neoplasm of unspecified site of unspecified female breast: Secondary | ICD-10-CM

## 2020-02-27 DIAGNOSIS — C50212 Malignant neoplasm of upper-inner quadrant of left female breast: Secondary | ICD-10-CM

## 2020-02-27 DIAGNOSIS — Z170 Estrogen receptor positive status [ER+]: Secondary | ICD-10-CM

## 2020-02-27 DIAGNOSIS — C519 Malignant neoplasm of vulva, unspecified: Secondary | ICD-10-CM

## 2020-02-27 MED ORDER — PROCHLORPERAZINE MALEATE 10 MG PO TABS: 10.0000 mg | ORAL_TABLET | Freq: Four times a day (QID) | ORAL | 1 refills | Status: DC | PRN

## 2020-02-27 MED ORDER — ONDANSETRON HCL 8 MG PO TABS: 8.0000 mg | ORAL_TABLET | Freq: Two times a day (BID) | ORAL | 1 refills | Status: DC | PRN

## 2020-02-27 MED ORDER — LIDOCAINE-PRILOCAINE 2.5-2.5 % EX CREA: TOPICAL_CREAM | CUTANEOUS | 3 refills | Status: DC

## 2020-02-27 MED ORDER — DEXAMETHASONE 4 MG PO TABS: 4.0000 mg | ORAL_TABLET | Freq: Every day | ORAL | 0 refills | Status: DC

## 2020-02-27 MED ORDER — LORAZEPAM 0.5 MG PO TABS: 0.5000 mg | ORAL_TABLET | Freq: Every evening | ORAL | 0 refills | Status: DC | PRN

## 2020-02-27 MED ORDER — GADOBUTROL 1 MMOL/ML IV SOLN
10.0000 mL | Freq: Once | INTRAVENOUS | Status: AC | PRN
  Administered 2020-02-27: 10 mL via INTRAVENOUS

## 2020-02-27 MED FILL — LORazepam 0.5 MG TABS: 0.5 | 30 days supply | Qty: 30 | Fill #0

## 2020-02-27 MED FILL — DEXAMETHASONE 4 MG TABLET: 4 | 30 days supply | Qty: 30 | Fill #0

## 2020-02-27 MED FILL — ONDANSETRON HCL 8 MG TABLET: 8 | 15 days supply | Qty: 30 | Fill #0

## 2020-02-27 MED FILL — LIDOCAINE-PRILOCAINE 2.5-2.: 2.5-2.5 | 30 days supply | Qty: 30 | Fill #0

## 2020-02-27 MED FILL — PROCHLORPERAZINE 10 MG TAB: 10 | 7 days supply | Qty: 30 | Fill #0

## 2020-02-27 NOTE — Progress Notes (Signed)
Patient here for a new consult with Dr. Sondra Come.  Patient Care Team: Rutherford Guys, MD as PCP - General (Family Medicine)  CHIEF COMPLAINTS/PURPOSE OF CONSULTATION:  Newly diagnosed breast cancer  HISTORY OF PRESENTING ILLNESS:  Sandra Miller 44 y.o. female is here because of recent diagnosis of left breast cancer. PET scan on 02/07/20 following a diagnosis of vulvar cancer showed a 1.7cm left breast mass and mildly hypermetabolic left axillary lymph nodes. Mammogram and [place] on 02/18/20 showed a 3.1cm left breast mass at the 11 o'clock position, palpable on exam, and a single abnormal left axillary lymph node with cortical thickening. Left breast biopsy on 02/20/20 showed invasive and in situ carcinoma in the breast and axilla, grade 3, HER-2 positive (3+), ER/PR+ 95%, Ki67 75%. She presents to the clinic today for initial evaluation and discussion of treatment options.    I reviewed her records extensively and collaborated the history with the patient.   SUMMARY OF ONCOLOGIC HISTORY:    Oncology History  Vulvar cancer ([place])  01/29/2020 Initial Diagnosis   Vulvar cancer ([place])   Malignant neoplasm of upper-inner quadrant of left breast in female, estrogen receptor positive ([place])  02/20/2020 Initial Diagnosis   PET scan on 02/07/20 following a diagnosis of vulvar cancer showed a 1.7cm left breast mass and mildly hypermetabolic left axillary lymph nodes. Mammogram and [place] on 02/18/20 showed a 3.1cm left breast mass at the 11 o'clock position, palpable on exam, and a single abnormal left axillary lymph node with cortical thickening. Left breast biopsy on 02/20/20 showed invasive and in situ carcinoma in the breast and axilla, grade 3, HER-2 positive (3+), ER/PR+ 95%, Ki67 75%     MEDICAL HISTORY:      Past Medical History:  Diagnosis Date  . Bipolar 1 disorder ([place])   . Complication of anesthesia    wakes up during procedures  . GAD (generalized anxiety disorder)   . Genital HSV     currently per pt  no break out 03-22-2016   . GERD (gastroesophageal reflux disease)   . Hiatal hernia   . History of cervical dysplasia    2012 laser ablation  . History of esophageal dilatation    for dysphasia -- x2 dilated  . History of gastric ulcer   . History of Helicobacter pylori infection    remote hx  . History of hidradenitis suppurativa    "gets all over body intermittantly"    . History of hypertension    no issue since stopped drinking alcohol 2014  . History of kidney stones   . History of panic attacks   . Iron deficiency anemia   . Left ureteral stone   . OCD (obsessive compulsive disorder)   . PTSD (post-traumatic stress disorder)   . Recovering alcoholic in remission ([place])    since 2014  . RLS (restless legs syndrome)   . Smokers' cough ([place])   . Urgency of urination   . Yeast infection involving the vagina and surrounding area    secondary to taking antibiotic    SURGICAL HISTORY:      Past Surgical History:  Procedure Laterality Date  . CESAREAN SECTION  1995   w/  Bilateral Tubal Ligation  . COLONOSCOPY  last one 08-09-2013  . CYSTOSCOPY W/ URETERAL STENT PLACEMENT Left 03/29/2016   Procedure: CYSTOSCOPY WITH STENT REPLACEMENT;  Surgeon: Nickie Retort, MD;  Location: [facility];  Service: Urology;  Laterality: Left;  . CYSTOSCOPY WITH RETROGRADE PYELOGRAM, URETEROSCOPY  AND STENT PLACEMENT Left 03/08/2016   Procedure: CYSTOSCOPY WITH  LEFT RETROGRADE PYELOGRAM, AND STENT PLACEMENT;  Surgeon: Nickie Retort, MD;  Location: [facility];  Service: Urology;  Laterality: Left;  . CYSTOSCOPY/RETROGRADE/URETEROSCOPY/STONE EXTRACTION WITH BASKET Left 03/29/2016   Procedure: CYSTOSCOPY/RETROGRADE/URETEROSCOPY/STONE EXTRACTION WITH BASKET;  Surgeon: Nickie Retort, MD;  Location: [facility];  Service: Urology;  Laterality: Left;  . ENDOMETRIAL ABLATION W/ NOVASURE  04-01-2010  .  ESOPHAGOGASTRODUODENOSCOPY  last one 08-09-2013  . KNEE ARTHROSCOPY Left as teen  . LASER ABLATION OF THE CERVIX  2012 approx  . TRANSTHORACIC ECHOCARDIOGRAM  05-19-2006   lvsf normal, ef 55-65%, there was mild flattening of the interventricular septum during diastoli/  RV size at upper limits normal  . TUBAL LIGATION    . WISDOM TOOTH EXTRACTION  age 57 's    SOCIAL HISTORY: Social History        Socioeconomic History  . Marital status: Single    Spouse name: Not on file  . Number of children: Not on file  . Years of education: Not on file  . Highest education level: Not on file  Occupational History  . Not on file  Tobacco Use  . Smoking status: Current Every Day Smoker    Packs/day: 1.00    Years: 22.00    Pack years: 22.00    Types: Cigarettes  . Smokeless tobacco: Never Used  . Tobacco comment: Recently started a smoking cessation class.   Vaping Use  . Vaping Use: Never used  Substance and Sexual Activity  . Alcohol use: Not Currently    Alcohol/week: 0.0 standard drinks    Comment:  hx alcohollism  in remission since 2014  . Drug use: No    Comment: hx  marijuana use  . Sexual activity: Yes    Partners: Male    Birth control/protection: Surgical  Other Topics Concern  . Not on file  Social History Narrative   Former [facility] patient.      Was on disability at one point.   Return to the workforce.  40 hours a week at [employer], 10 hours a week on the weekends at [employer] in [place] at night 10 to 12 hours a week.      Has grown children, she lives alone with a pet, continues to smoke no alcohol or drug use at this time      History of EtOH abuse and THC use.         Social Determinants of Health      Financial Resource Strain:   . Difficulty of Paying Living Expenses:   Food Insecurity: No Food Insecurity  . Worried About Charity fundraiser in the Last Year: Never true  . Ran  Out of Food in the Last Year: Never true  Transportation Needs: No Transportation Needs  . Lack of Transportation (Medical): No  . Lack of Transportation (Non-Medical): No  Physical Activity:   . Days of Exercise per Week:   . Minutes of Exercise per Session:   Stress:   . Feeling of Stress :   Social Connections:   . Frequency of Communication with Friends and Family:   . Frequency of Social Gatherings with Friends and Family:   . Attends Religious Services:   . Active Member of Clubs or Organizations:   . Attends Archivist Meetings:   Marland Kitchen Marital Status:   Intimate Partner Violence:   . Fear of Current or Ex-Partner:   .  Emotionally Abused:   Marland Kitchen Physically Abused:   . Sexually Abused:     FAMILY HISTORY:      Family History  Problem Relation Age of Onset  . Heart disease Father   . Lung cancer Father   . Alcohol abuse Father   . Heart disease Mother   . Depression Mother   . Anxiety disorder Mother   . Drug abuse Brother   . Alcohol abuse Brother   . Drug abuse Brother   . ADD / ADHD Brother   . Colon polyps Brother   . Stomach cancer Paternal Grandfather   . Diabetes Maternal Grandfather   . Diabetes Paternal Grandmother   . Kidney disease Maternal Uncle   . Cirrhosis Cousin        alcoholic  . Anxiety disorder Maternal Aunt   . Depression Maternal Aunt   . Cancer Cousin        cervical cancer    ALLERGIES:  is allergic to depakote [divalproex sodium], minocycline, and aspirin.  MEDICATIONS:        Current Outpatient Medications  Medication Sig Dispense Refill  . acetaminophen (TYLENOL) 500 MG tablet Take 1,000 mg by mouth every 6 (six) hours as needed for moderate pain or fever.    . gabapentin (NEURONTIN) 300 MG capsule Take 1 capsule (300 mg total) by mouth daily. 90 capsule 3  . lisinopril-hydrochlorothiazide (ZESTORETIC) 20-12.5 MG tablet Take 1 tablet by mouth daily. 90 tablet 1  . LORazepam (ATIVAN) 1 MG tablet  Take 1 tablet (1 mg total) by mouth 3 (three) times daily as needed for anxiety. 90 tablet 0  . Multiple Vitamin (MULTIVITAMIN) tablet Take 1 tablet by mouth daily.    . nicotine (NICODERM CQ - DOSED IN MG/24 HOURS) 21 mg/24hr patch Place 1 patch (21 mg total) onto the skin daily. 28 patch 0  . OLANZapine (ZYPREXA) 15 MG tablet Take 1 tablet (15 mg total) by mouth at bedtime. 90 tablet 0  . omeprazole (PRILOSEC) 40 MG capsule TAKE 1 CAPSULE(40 MG) BY MOUTH TWICE DAILY BEFORE A MEAL 180 capsule 0  . Probiotic Product (PROBIOTIC DAILY PO) Take by mouth.    . topiramate (TOPAMAX) 25 MG tablet Take 1 tablet (25 mg total) by mouth at bedtime. 90 tablet 0  . traZODone (DESYREL) 100 MG tablet Take 1 tablet (100 mg total) by mouth at bedtime. 90 tablet 0  . venlafaxine XR (EFFEXOR XR) 37.5 MG 24 hr capsule Take three capsule (112.5 mg total) by mouth daily. 90 capsule 0   No current facility-administered medications for this visit.    REVIEW OF SYSTEMS:   Constitutional: Denies fevers, chills or abnormal night sweats Eyes: Denies blurriness of vision, double vision or watery eyes Ears, nose, mouth, throat, and face: Denies mucositis or sore throat Respiratory: Denies cough, dyspnea or wheezes Cardiovascular: Denies palpitation, chest discomfort or lower extremity swelling Gastrointestinal:  Denies nausea, heartburn or change in bowel habits Skin: Denies abnormal skin rashes Lymphatics: Denies new lymphadenopathy or easy bruising Neurological:Denies numbness, tingling or new weaknesses Behavioral/Psych: Mood is stable, no new changes  Breast: Palpable left breast mass All other systems were reviewed with the patient and are negative.  PHYSICAL EXAMINATION: ECOG PERFORMANCE STATUS: 1 - Symptomatic but completely ambulatory     Vitals:   02/25/20 1546  BP: 137/75  Pulse: (!) 106  Resp: 17  Temp: 98.7 F (37.1 C)  SpO2: 100%   Filed Weights   02/25/20 1546  Weight: 212 lb  12.8 oz (96.5 kg)    GENERAL:alert, no distress and comfortable SKIN: skin color, texture, turgor are normal, no rashes or significant lesions EYES: normal, conjunctiva are pink and non-injected, sclera clear OROPHARYNX:no exudate, no erythema and lips, buccal mucosa, and tongue normal  NECK: supple, thyroid normal size, non-tender, without nodularity LYMPH:  no palpable lymphadenopathy in the cervical, axillary or inguinal LUNGS: clear to auscultation and percussion with normal breathing effort HEART: regular rate & rhythm and no murmurs and no lower extremity edema ABDOMEN:abdomen soft, non-tender and normal bowel sounds Musculoskeletal:no cyanosis of digits and no clubbing  PSYCH: alert & oriented x 3 with fluent speech NEURO: no focal motor/sensory deficits    LABORATORY DATA:  I have reviewed the data as listed Recent Labs       Lab Results  Component Value Date   WBC 9.4 07/09/2018   HGB 12.9 07/09/2018   HCT 37.2 07/09/2018   MCV 84 07/09/2018   PLT 356 07/09/2018     Recent Labs       Lab Results  Component Value Date   NA 141 10/09/2018   K 4.1 10/09/2018   CL 103 10/09/2018   CO2 22 10/09/2018      RADIOGRAPHIC STUDIES: I have personally reviewed the radiological reports and agreed with the findings in the report.  ASSESSMENT AND PLAN:  Malignant neoplasm of upper-inner quadrant of left breast in female, estrogen receptor positive ([place]) 02/20/2020:PET scan on 02/07/20 following a diagnosis of vulvar cancer showed a 1.7cm left breast mass and mildly hypermetabolic left axillary lymph nodes. Mammogram and [place] on 02/18/20 showed a 3.1cm left breast mass at the 11 o'clock position, palpable on exam, and a single abnormal left axillary lymph node with cortical thickening. Left breast biopsy on 02/20/20 showed invasive and in situ carcinoma in the breast and axilla, grade 3, HER-2 positive (3+), ER/PR+ 95%, Ki67 75%  Pathology and radiology counseling:  Discussed with the patient, the details of pathology including the type of breast cancer,the clinical staging, the significance of ER, PR and HER-2/neu receptors and the implications for treatment. After reviewing the pathology in detail, we proceeded to discuss the different treatment options between surgery, radiation, chemotherapy, antiestrogen therapies.  Recommendation based on multidisciplinary tumor board: 1. Neoadjuvant chemotherapy with TCH Perjeta 6 cycles followed by Herceptin maintenance for 1 year 2. Followed by breast conserving surgery if possible with sentinel lymph node study 3. Followed by adjuvant radiation therapy if patient had lumpectomy  Chemotherapy Counseling: I discussed the risks and benefits of chemotherapy including the risks of nausea/ vomiting, risk of infection from low WBC count, fatigue due to chemo or anemia, bruising or bleeding due to low platelets, mouth sores, loss/ change in taste and decreased appetite. Liver and kidney function will be monitored through out chemotherapy as abnormalities in liver and kidney function may be a side effect of treatment. Cardiac dysfunction due to Herceptin and Perjeta were discussed in detail. Risk of permanent bone marrow dysfunction due to chemo were also discussed.  Plan: 1. Port placement 2. Echocardiogram 3. Chemotherapy class 4. Breast MRI 5.  Cervical lymph node: We will obtain a biopsy of the lymph node. 6.  Genetics consultation ------------------------------------------------------------------- Vulvar squamous cell carcinoma: Patient met with [facility] GYN oncology who suggested that she needs to receive neoadjuvant chemo with radiation (chemo as a radiosensitizer) and then subsequently undergo surgical resection.  My recommendation would be to start off with vulvar radiation along with carboplatin and Herceptin followed by vulvar  surgery.  Subsequently she can start neoadjuvant chemo for breast cancer.  I will  discussed with Dr. Denman George and Dr. [name].  All questions were answered. The patient knows to call the clinic with any problems, questions or concerns.   Rulon Eisenmenger, MD, MPH 02/25/2020    I, Molly Dorshimer, am acting as scribe for Nicholas Lose, MD.  I have reviewed the above documentation for accuracy and completeness, and I agree with the above.          Electronically signed by Nicholas Lose, MD at 02/25/2020 4:47 PM  Consult Note: Gyn-Onc  Consult was requested by Dr. Kennon Rounds for the evaluation of Xitlaly C Verner 44 y.o. female  CC:      Chief Complaint  Patient presents with  . Vulvar cancer [facility])    New Patient    Assessment/Plan:  Sandra Miller  is a 44 y.o.  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  I am recommending PET scan to evaluate for metastatic disease to the nodes or distant sites.  If negative, I am recommending proceeding with radical posterior vulvectomy with SLN biopsy. I discussed that the patient has a high risk of having close or positive margins due to the proximity to the anus and anal sphincter.  I explained that dysfunction and defecation may result from her surgery.  Close to positive margins will require adjuvant radiation postoperatively and I explained this to the patient.  Due to her young age, clinically negative nodes, and history of employment in a physically active job, I think she is a particularly good candidate for sentinel lymph node biopsy.  I explained that we do not offer this at [facility] as there is no equipment available in an inpatient setting for such a procedure here.  However this equipment is available in appropriate setting at [facility] and therefore I will have the patient seen by my partner, Dr. Margaretmary Bayley to discuss proceeding with surgery in early June.  I outlined the anticipated surgery and its potential for complications as stated above.  I explained  that her history of tobacco abuse places her at particular risk for complications in the wound bed, particularly failure of wound healing, infection.  I explained that if sentinel lymph node biopsy is unsuccessful and mapping a sentinel node bilateral she may require complete lymphadenectomy in which case there is approximately 33% risk for lifelong lymphedema.  I have prescribed her nicotine patch to assist in preoperative smoking cessation.   HPI: Sandra Miller is a 44 year old P2 who was seen in consultation at the request of Dr. Kennon Rounds for evaluation of squamous cell carcinoma of the vulva.  The patient reported an approximately 6-year history of a palpable lesion in the posterior vulva at the perineal body.  She had mentioned it to her care provider during the time of a Pap test approximately 2 years prior to diagnosis.  However her provider felt that this was most consistent with HPV changes and condyloma and therefore biopsy was not taken at that time.  The patient reported that she had not noted particular growth or change in the lesion over the years.  She was presenting for routine cervical cancer screening test in April 2021 and this was being performed by a different provider who felt less confident with the appearance of the mass on exam.  That Pap smear performed on December 12, 2019 was negative for intraepithelial lesion or  malignancy and negative for high-risk HPV.  Due to the concerning appearance of this lesion her provider referred her to Dr. Jeannetta Nap, who performed an office biopsy of the lesion on Jan 23, 2020.  At 2 sites the biopsy returned as invasive squamous cell carcinoma.  Grade of the lesion was not provided.  The patient's medical history is significant for obesity with a BMI of 35, hypertension, bipolar disorder, anxiety, HSV chronic infection, tobacco abuse (1 pack/day since age 11).  She has a remote history of an abnormal Pap test, actually multiple of them,  in her 50s for which she had had procedures on her cervix including what she describes as freezing and burning procedures.  She has had no abnormal Paps in the last decade, and receives fairly regular gynecologic and primary care.  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  She works at [employer].  Her gynecologic history in addition to her history of abnormal Paps is significant for 1 prior vaginal delivery 1 prior cesarean section. Review of Systems:  Constitutional  Feels well, poor appetite. ENT Normal appearing ears and nares bilaterally Skin/Breast  No rash, sores, jaundice, itching, dryness Cardiovascular  No chest pain, shortness of breath, or edema  Pulmonary  No cough or wheeze.  Gastro Intestinal  No nausea, vomitting, or diarrhoea. No bright red blood per rectum, no abdominal pain, change in bowel movement, or constipation.  Genito Urinary  No frequency, urgency, dysuria, + posterior vulvar irritation.  Musculo Skeletal  No myalgia, arthralgia, joint swelling or pain  Neurologic  No weakness, numbness, change in gait,  Psychology  No depression, anxiety, insomnia.   Vitals:  Blood pressure 136/77, pulse (!) 115, temperature 99.3 F (37.4 C), temperature source Oral, resp. rate 18, height 5' 5.5" (1.664 m), weight 218 lb 1.6 oz (98.9 kg), SpO2 100 %.  Physical Exam: WD in NAD Neck  Supple NROM, without any enlargements.  Lymph Node Survey No cervical supraclavicular or inguinal adenopathy Cardiovascular  Pulse normal rate, regularity and rhythm. S1 and S2 normal.  Lungs  Clear to auscultation bilateraly, without wheezes/crackles/rhonchi. Good air movement.  Skin  No rash/lesions/breakdown  Psychiatry  Alert and oriented to person, place, and time  Abdomen  Normoactive bowel sounds, abdomen soft, non-tender and mildly obese without evidence of  hernia. Back No CVA tenderness Genito Urinary  Vulva/vagina: 4.5cm ulcerated warty lesion in the midline perineum, extending to the deeper tissues, approximating anal sphincter, but not palpably invasive to the anus or rectum. Not fixed.              Bladder/urethra:  No lesions or masses, well supported bladder             Vagina: smooth, no lesions. Vulvar lesion does not extend into vagina.             Cervix: Normal appearing, no lesions.             Uterus: Small, mobile, no parametrial involvement or nodularity.             Adnexa: no palpable masses. Rectal  Good tone, no masses no cul de sac nodularity. Lesion does not appear to infiltrate through to rectum or anal mucosa. Extremities  No bilateral cyanosis, clubbing or edema.   Thereasa Solo, MD  01/29/2020, 11:37 AM   Past/Anticipated interventions by Gyn/Onc surgery, if any: will have surgery  in the future  Past/Anticipated interventions by medical oncology, if any: will have chemo  Weight changes, if any: has lost 9 lbs in 4 weeks.  Bowel/Bladder complaints, if any: no Nausea/Vomiting, if any: stays nauseated  Pain issues, if any: no SAFETY ISSUES:  Prior radiation? no  Pacemaker/ICD? no  Possible current pregnancy? ablation  Is the patient on methotrexate? no  Current Complaints / other details: Talked with social worker yesterday. Patient iv very overwhelmed with her DX.  BP 105/70 (BP Location: Right Arm, Patient Position: Sitting, Cuff Size: Large)   Pulse 84   Temp (!) 97.4 F (36.3 C)   Resp 18   Ht 5' 5.5" (1.664 m)   Wt 211 lb (95.7 kg)   SpO2 100%   BMI 34.58 kg/m    Wt Readings from Last 3 Encounters:  03/03/20 211 lb (95.7 kg)  02/25/20 212 lb 12.8 oz (96.5 kg)  01/29/20 218 lb 1.6 oz (98.9 kg)

## 2020-02-27 NOTE — Progress Notes (Signed)
START OFF PATHWAY REGIMEN - Other   [id number]:Carboplatin + Paclitaxel (2/50) + RT weekly x 6 weeks:   Administer weekly during RT:     Paclitaxel      Carboplatin   **Always confirm dose/schedule in your pharmacy ordering system**  Patient Characteristics: Intent of Therapy: Curative Intent, Discussed with Patient

## 2020-02-27 NOTE — Telephone Encounter (Signed)
Spoke to patient to confirm chemo education appointment for 6/28

## 2020-02-27 NOTE — Telephone Encounter (Signed)
Called Sandra Miller with appointment for consults with Dr. Sondra Come on 03/03/20.  She verbalized understanding and agreement.

## 2020-02-27 NOTE — Telephone Encounter (Signed)
Scheduled per 6/22 los. Called and spoke with patient, confirmed 7/22 appt

## 2020-02-27 NOTE — Telephone Encounter (Signed)
Called pt to provide navigation resources, contact information as well as discuss next steps in treatment.  Left vm and requested return call. Contact information provided.

## 2020-02-28 ENCOUNTER — Other Ambulatory Visit: Payer: Self-pay

## 2020-02-28 ENCOUNTER — Encounter (HOSPITAL_BASED_OUTPATIENT_CLINIC_OR_DEPARTMENT_OTHER): Payer: Self-pay | Admitting: Surgery

## 2020-02-28 ENCOUNTER — Other Ambulatory Visit: Payer: Self-pay | Admitting: *Deleted

## 2020-02-28 ENCOUNTER — Encounter (HOSPITAL_COMMUNITY): Payer: Self-pay

## 2020-02-28 ENCOUNTER — Encounter: Payer: Self-pay | Admitting: Licensed Clinical Social Worker

## 2020-02-28 ENCOUNTER — Encounter: Payer: Self-pay | Admitting: *Deleted

## 2020-02-28 ENCOUNTER — Other Ambulatory Visit: Payer: Self-pay | Admitting: Surgery

## 2020-02-28 ENCOUNTER — Telehealth: Payer: Self-pay | Admitting: Oncology

## 2020-02-28 ENCOUNTER — Telehealth: Payer: Self-pay | Admitting: *Deleted

## 2020-02-28 ENCOUNTER — Other Ambulatory Visit (HOSPITAL_COMMUNITY): Payer: Self-pay | Admitting: Surgery

## 2020-02-28 DIAGNOSIS — C50212 Malignant neoplasm of upper-inner quadrant of left female breast: Secondary | ICD-10-CM

## 2020-02-28 DIAGNOSIS — C50919 Malignant neoplasm of unspecified site of unspecified female breast: Secondary | ICD-10-CM

## 2020-02-28 DIAGNOSIS — R928 Other abnormal and inconclusive findings on diagnostic imaging of breast: Secondary | ICD-10-CM

## 2020-02-28 NOTE — Telephone Encounter (Signed)
Called Sandra Miller and advised her that her appointment with Dr. Sondra Come has been moved on Tuesday due to a biopsy appointment.  She will have a nurse eval at 9:30 and see Dr. Sondra Come at 10:00.

## 2020-02-28 NOTE — Progress Notes (Signed)
[place] Work  Holiday representative contacted patient by phone  to offer support and assess for needs for patient with newly diagnosed breast and vulvar cancer. Patient reports struggling emotionally with all of the news. She is connected with psychiatry and saw her provider on 02/24/20. In addition to coping with the diagnoses, Dhani is worried about finances as she won't be able to work for a significant period of time.  She is currently receiving short-term disability but it is not enough to cover expenses. She is interested in applying for SS disability and agreed to a referral to [facility]. CSW will meet with patient on Monday to sign release form and to review other applications for assistance from cancer foundations. Patient appreciative of the information.      Maekayla Giorgio, [place], [place] Worker [facility]

## 2020-02-28 NOTE — Progress Notes (Unsigned)
Sandra Miller   Sandra Miller Female, 44 y.o., 29-Mar-1976 MRN:  [id number] Phone:  [phone] [name]) PCP:  Rutherford Guys, MD Coverage:  [employer] Next Appt With Radiology ([facility]) 03/03/2020 at 1:00 PM  RE: Biopsy Received: Today Message Details  [name], MD  [name] Sorry I thought that y'all had d/w Pascal Lux  But now I understand that is what they wrote  You did right   Thx  DDH   Previous Messages  ----- Message -----  From: [name]  Sent: 02/28/2020 11:29 AM EDT  To: [name], MD  Subject: RE: Biopsy                    Sorry but It said D/W but I did not see any notes or a review  And I thought I was suppose to always have  A Rad Review in the progress notes/notes.   ----- Message -----  From: [name], MD  Sent: 02/28/2020 10:08 AM EDT  To: [name]  Subject: RE: Biopsy                    If Watts already approved not sure why it's submitted here   Anyway  Ok for  [place] FNA R cerv LN (core if possible)   DDH    ----- Message -----  From: [name]  Sent: 02/27/2020  6:22 PM EDT  To: [facility] Procedure Requests  Subject: Biopsy                      Procedure Requested: [place] Core Biopsy (Lymph nodes)    Reason for Procedure: Malignant neoplasm of upper-inner quadrant of left breast in female, estrogen receptor positive (Fowler ...Marland KitchenMarland KitchenCervical Lymph node biopsy    Provider Requesting: Dr Lindi Adie  Provider Telephone: [phone]    Other Info: D/W Dr.Watts, who approved for urgent scheduling

## 2020-02-28 NOTE — Telephone Encounter (Signed)
Spoke with patient to discuss her MRI results and the need for additional biopsy for the left breast and a biopsy of the right breast.  Also gave her navigation contact information.  She understands someone will be calling her with appointments for these biopsies.

## 2020-03-01 ENCOUNTER — Other Ambulatory Visit: Payer: Self-pay | Admitting: Radiology

## 2020-03-02 ENCOUNTER — Ambulatory Visit (HOSPITAL_COMMUNITY)
Admission: RE | Admit: 2020-03-02 | Discharge: 2020-03-02 | Disposition: A | Discharge status: Home / Self Care | Payer: No Typology Code available for payment source | Source: Ambulatory Visit | Attending: Hematology and Oncology | Admitting: Hematology and Oncology

## 2020-03-02 ENCOUNTER — Encounter: Payer: Self-pay | Admitting: Hematology and Oncology

## 2020-03-02 ENCOUNTER — Inpatient Hospital Stay: Payer: No Typology Code available for payment source | Admitting: Licensed Clinical Social Worker

## 2020-03-02 ENCOUNTER — Other Ambulatory Visit (HOSPITAL_COMMUNITY): Payer: Self-pay | Admitting: Psychiatry

## 2020-03-02 ENCOUNTER — Other Ambulatory Visit (HOSPITAL_COMMUNITY): Payer: Self-pay

## 2020-03-02 ENCOUNTER — Inpatient Hospital Stay: Payer: No Typology Code available for payment source

## 2020-03-02 ENCOUNTER — Other Ambulatory Visit: Payer: Self-pay

## 2020-03-02 ENCOUNTER — Encounter: Payer: Self-pay | Admitting: *Deleted

## 2020-03-02 ENCOUNTER — Telehealth: Payer: Self-pay | Admitting: Oncology

## 2020-03-02 DIAGNOSIS — Z8249 Family history of ischemic heart disease and other diseases of the circulatory system: Secondary | ICD-10-CM | POA: Diagnosis not present

## 2020-03-02 DIAGNOSIS — Z20822 Contact with and (suspected) exposure to covid-19: Secondary | ICD-10-CM | POA: Insufficient documentation

## 2020-03-02 DIAGNOSIS — Z79899 Other long term (current) drug therapy: Secondary | ICD-10-CM | POA: Insufficient documentation

## 2020-03-02 DIAGNOSIS — F17210 Nicotine dependence, cigarettes, uncomplicated: Secondary | ICD-10-CM | POA: Insufficient documentation

## 2020-03-02 DIAGNOSIS — Z833 Family history of diabetes mellitus: Secondary | ICD-10-CM | POA: Diagnosis not present

## 2020-03-02 DIAGNOSIS — I10 Essential (primary) hypertension: Secondary | ICD-10-CM | POA: Diagnosis not present

## 2020-03-02 DIAGNOSIS — Z801 Family history of malignant neoplasm of trachea, bronchus and lung: Secondary | ICD-10-CM | POA: Diagnosis not present

## 2020-03-02 DIAGNOSIS — F319 Bipolar disorder, unspecified: Secondary | ICD-10-CM | POA: Insufficient documentation

## 2020-03-02 DIAGNOSIS — I899 Noninfective disorder of lymphatic vessels and lymph nodes, unspecified: Secondary | ICD-10-CM | POA: Insufficient documentation

## 2020-03-02 DIAGNOSIS — Z841 Family history of disorders of kidney and ureter: Secondary | ICD-10-CM | POA: Diagnosis not present

## 2020-03-02 DIAGNOSIS — Z888 Allergy status to other drugs, medicaments and biological substances status: Secondary | ICD-10-CM | POA: Insufficient documentation

## 2020-03-02 DIAGNOSIS — K219 Gastro-esophageal reflux disease without esophagitis: Secondary | ICD-10-CM | POA: Insufficient documentation

## 2020-03-02 DIAGNOSIS — Z170 Estrogen receptor positive status [ER+]: Secondary | ICD-10-CM | POA: Diagnosis not present

## 2020-03-02 DIAGNOSIS — Z01818 Encounter for other preprocedural examination: Secondary | ICD-10-CM | POA: Insufficient documentation

## 2020-03-02 DIAGNOSIS — F411 Generalized anxiety disorder: Secondary | ICD-10-CM

## 2020-03-02 DIAGNOSIS — C50212 Malignant neoplasm of upper-inner quadrant of left female breast: Secondary | ICD-10-CM | POA: Diagnosis not present

## 2020-03-02 DIAGNOSIS — Z886 Allergy status to analgesic agent status: Secondary | ICD-10-CM | POA: Insufficient documentation

## 2020-03-02 DIAGNOSIS — Z8544 Personal history of malignant neoplasm of other female genital organs: Secondary | ICD-10-CM | POA: Diagnosis not present

## 2020-03-02 DIAGNOSIS — C519 Malignant neoplasm of vulva, unspecified: Secondary | ICD-10-CM

## 2020-03-02 MED ORDER — VENLAFAXINE HCL ER 37.5 MG PO CP24: ORAL_CAPSULE | ORAL | 0 refills | Status: DC

## 2020-03-02 MED FILL — VENLAFAXINE HCL ER 37.5 MG: 37.5 | 30 days supply | Qty: 90 | Fill #0

## 2020-03-02 NOTE — Progress Notes (Signed)
Radiation Oncology         [phone] ________________________________  Initial Outpatient Consultation  Name: JARROD BODKINS MRN: [id number]  Date: 03/03/2020  DOB: 27-Jul-1976  JY:NWGNFAOZ, Lilia Argue, MD  Everitt Amber, MD   REFERRING PHYSICIAN: Everitt Amber, MD  DIAGNOSIS: The primary encounter diagnosis was Vulvar cancer [facility]). Diagnoses of Vulvar ulceration and Malignant neoplasm of upper-inner quadrant of left breast in female, estrogen receptor positive ([place]) were also pertinent to this visit.  Clinical stage IB squamous cell carcinoma of the  posterior vulva/perineal body  Multifocal Left breast, Invasive Ductal Carcinoma with DCIS, ER + / PR + / Her2 +, Grade 3    Right breast biopsy is pending, right neck biopsy pending  HISTORY OF PRESENT ILLNESS::Erricka C Reisch is a 44 y.o. female who is seen as a courtesy of Dr. Denman George for an opinion concerning radiation therapy as part of management for her recently diagnosed vulvar carcinoma. Today, she is accompanied by her son. The patient presented to Dr. Kennon Rounds, OB-GYN, on 01/23/2020 for evaluation of palpable lesion at the posterior vulva/perineal body that had been present for six years. On Dr. Virginia Crews examination, the patient was noted to have a large 3.5 x 3.0 cm ulcer at the posterior fourchette with some rounded edges; ulceration went to the rectum. Several other verrucous-type lesions were also noted. Biopsies of the ulcerative lesion and other vulvar lesion were taken and both showed invasive squamous cell carcinoma. Of note, the patient's most recent routine cervical cancer screening took place on 12/11/2019 by Dr. Darene Lamer, who at that time felt that the patient's lesion was most consistent with HPV changes and condyloma and therefore a biopsy was not taken. PAP smear at that time was negative for intraepithelial lesion or malignancy. High-risk HPV was also negative.  The patient was referred to Dr. Denman George and was seen in consultation  on 01/29/2020. On her examination, she noted a 4.5 cm ulcerated warty lesion in the midline perineum that extended to the deeper tissues, approximating anal sphincter, but not palpably invasive to the anus or rectum. There was no cervical supraclavicular or inguinal adenopathy noted. Given the biopsy results, Dr. Denman George recommended that the patient proceed with a PET scan followed by a radical posterior vulvectomy with sentinel lymph node biopsy, given that the PET scan was negative.  PET scan on 02/07/2020 showed a hypermetabolic 1.7 cm left breast mass that was concerning for synchronous breast cancer. There were mildly hypermetabolic left axillary lymph nodes that were less specific and could have been drom local nodal spread but could have been related to the patient's COVID-19 vaccination which could cause accentuated nodal activity. Given that she was four months out from vaccination at that time, the likelihood of it being due to the vaccination was less. Additionally, there were hypermetabolic lymph nodes in the right neck, most strikingly a moderately hypermetabolic right level Ia lymph node with maximum SUV of 7.8. Malignancy was not excluded, although it would be an unusual pathway for the patient's volar malignancy or for the patient's left breast lesion. Finally, there was some activity along the perineum that may have been related to the vulvar lesion, post-operative findings from the vulvar lesion, or possibly simply incontinence of urine containing excreted FDG.  On 02/07/2020, the patient noted that she had felt a lump in her left breast for two weeks and had a mammogram scheduled for 02/06/2020 but cancelled it secondary to her PET scan being the day after. She also reported feeling  a lump under the right side of her neck.  The patient's case was discussed at the [place] on 02/17/2020, during which time it was recommended that she proceed with  radiation and imaging (diagnostic mammogram/ultrasound of left breast mass/hypermetabolic left axillary lymph nodes).  Bilateral diagnostic mammogram and left breast ultrasound were performed on 02/18/2020. Results showed a palpable left breast mass at 11 o'clock that measured 3.1 cm and was highly suspicious. There were associated suspicious calcifications that extended posterior to the mass. The overall area of concern measured approximately 4.3 cm. Additionally, there was suggestion of tethering to the skin mammographically. Finally, there was a single abnormal left axillary lymph node.  Biopsy of the upper inner left breast 02/18/2020 showed grade 3 invasive mammary carcinoma and mammary carcinoma in-situ. E-cadherin was positive, supporting a ductal origin. The left axillary lymph node was also biopsied and showed metastatic carcinoma. Prognostic indicators were significant for Estrogen Receptor 95% positive with moderate staining intensity and Progesterone Receptor 95% positive with strong staining intensity. Ki67 proliferation marker was 75%. Her2 positive.  Biopsy of the lower inner left breast on 02/20/2020 showed grade 3 invasive ductal carcinoma and ductal carcinoma in-situ with calcifications and necrosis.  The patient was referred to Dr. Lindi Adie and was seen in consultation on 02/25/2020. At that time, it was recommended that the patient proceed with neoadjuvant chemotherapy with [facility] Perjeta x6 cycles followed by Herceptin maintenance for one year. Chemotherapy would be followed by breast conserving surgery with possible sentinel lymph node study and then by adjuvant radiation therapy if the patient underwent a lumpectomy. Considering the patient's vulvar cancer, Dr. Lindi Adie recommended beginning vulvar radiation along with Caroplatin and Herceptin followed by vulvar surgery. Subsequently, she could start neoadjuvant chemotherapy for her breast cancer.  MRI of bilateral breasts on 02/27/2020  showed a 4.2 cm mass in the medial left breast at approximately 2:30 o'clock, middle depth, reflecting the recently diagnosed left breast carcinoma. There was also a small adjacent mass in the central left breast that measured 9 mm in long axis and could reflect a satellite carcinoma. Additionally, there was a third indeterminate enhancing mass in the posterolateral left breast at the approximate 9:30 o'clock position. Finally, in the right breast, there was a linear area of non-mass enhancement spanning a length of 5.7 cm that was projecting in the medial breast and appeared more prominent than the marked background enhancement, possibly reflecting an area of DCIS.  Echocardiogram performed yesterday, 03/02/2020, showed an EF of 55-60%.  Ultrasound-guided core biopsy of lymph nodes along the right neck  is scheduled for this afternoon.  PREVIOUS RADIATION THERAPY: No  PAST MEDICAL HISTORY:  Past Medical History:  Diagnosis Date  . Bipolar 1 disorder ([place])   . Complication of anesthesia    wakes up during procedures  . GAD (generalized anxiety disorder)   . Genital HSV    currently per pt  no break out 03-22-2016   . GERD (gastroesophageal reflux disease)   . Hiatal hernia   . History of cervical dysplasia    2012 laser ablation  . History of esophageal dilatation    for dysphasia -- x2 dilated  . History of gastric ulcer   . History of Helicobacter pylori infection    remote hx  . History of hidradenitis suppurativa    "gets all over body intermittantly"    . History of hypertension    no issue since stopped drinking alcohol 2014  . History of kidney stones   .  History of panic attacks   . Iron deficiency anemia   . Left ureteral stone   . OCD (obsessive compulsive disorder)   . PTSD (post-traumatic stress disorder)   . Recovering alcoholic in remission ([place])    since 2014  . RLS (restless legs syndrome)   . Smokers' cough ([place])   . Urgency of urination   . Yeast infection  involving the vagina and surrounding area    secondary to taking antibiotic    PAST SURGICAL HISTORY: Past Surgical History:  Procedure Laterality Date  . CESAREAN SECTION  1995   w/  Bilateral Tubal Ligation  . COLONOSCOPY  last one 08-09-2013  . CYSTOSCOPY W/ URETERAL STENT PLACEMENT Left 03/29/2016   Procedure: CYSTOSCOPY WITH STENT REPLACEMENT;  Surgeon: Nickie Retort, MD;  [facility];  Service: Urology;  Laterality: Left;  . CYSTOSCOPY WITH RETROGRADE PYELOGRAM, URETEROSCOPY AND STENT PLACEMENT Left 03/08/2016   Procedure: CYSTOSCOPY WITH  LEFT RETROGRADE PYELOGRAM, AND STENT PLACEMENT;  Surgeon: Nickie Retort, MD;  Location: [facility];  Service: Urology;  Laterality: Left;  . CYSTOSCOPY/RETROGRADE/URETEROSCOPY/STONE EXTRACTION WITH BASKET Left 03/29/2016   Procedure: CYSTOSCOPY/RETROGRADE/URETEROSCOPY/STONE EXTRACTION WITH BASKET;  Surgeon: Nickie Retort, MD;  Location: [facility];  Service: Urology;  Laterality: Left;  . ENDOMETRIAL ABLATION W/ NOVASURE  04-01-2010  . ESOPHAGOGASTRODUODENOSCOPY  last one 08-09-2013  . KNEE ARTHROSCOPY Left as teen  . LASER ABLATION OF THE CERVIX  2012 approx  . TRANSTHORACIC ECHOCARDIOGRAM  05-19-2006   lvsf normal, ef 55-65%, there was mild flattening of the interventricular septum during diastoli/  RV size at upper limits normal  . TUBAL LIGATION    . WISDOM TOOTH EXTRACTION  age 30 's    FAMILY HISTORY:  Family History  Problem Relation Age of Onset  . Heart disease Father   . Lung cancer Father   . Alcohol abuse Father   . Heart disease Mother   . Depression Mother   . Anxiety disorder Mother   . Drug abuse Brother   . Alcohol abuse Brother   . Drug abuse Brother   . ADD / ADHD Brother   . Colon polyps Brother   . Stomach cancer Paternal Grandfather   . Diabetes Maternal Grandfather   . Diabetes Paternal Grandmother   . Kidney disease Maternal Uncle   . Cirrhosis Cousin         alcoholic  . Anxiety disorder Maternal Aunt   . Depression Maternal Aunt   . Cancer Cousin        cervical cancer    SOCIAL HISTORY:  Social History   Tobacco Use  . Smoking status: Current Every Day Smoker    Packs/day: 0.50    Years: 22.00    Pack years: 11.00    Types: Cigarettes  . Smokeless tobacco: Never Used  . Tobacco comment: Recently started a smoking cessation class.   Vaping Use  . Vaping Use: Never used  Substance Use Topics  . Alcohol use: Not Currently    Alcohol/week: 0.0 standard drinks    Comment:  hx alcohollism  in remission since 2014  . Drug use: No    Comment: hx  marijuana use    ALLERGIES:  Allergies  Allergen Reactions  . Depakote [Divalproex Sodium] Nausea And Vomiting  . Minocycline Hives  . Aspirin Hives    States able to tolerate Goody Powders without any problem except for GI upset    MEDICATIONS:  Current Outpatient Medications  Medication Sig Dispense Refill  . acetaminophen (TYLENOL) 500 MG tablet Take 1,000 mg by mouth every 6 (six) hours as needed for moderate pain or fever.    Marland Kitchen dexamethasone (DECADRON) 4 MG tablet Take 1 tablet (4 mg total) by mouth daily. Start the day after chemotherapy for 2 days. 30 tablet 0  . gabapentin (NEURONTIN) 300 MG capsule Take 1 capsule (300 mg total) by mouth daily. 90 capsule 3  . lidocaine-prilocaine (EMLA) cream Apply to affected area once 30 g 3  . lisinopril-hydrochlorothiazide (ZESTORETIC) 20-12.5 MG tablet Take 1 tablet by mouth daily. 90 tablet 1  . LORazepam (ATIVAN) 0.5 MG tablet Take 1 tablet (0.5 mg total) by mouth at bedtime as needed for sleep. 30 tablet 0  . LORazepam (ATIVAN) 1 MG tablet Take 1 tablet (1 mg total) by mouth 3 (three) times daily as needed for anxiety. 90 tablet 0  . Multiple Vitamin (MULTIVITAMIN) tablet Take 1 tablet by mouth daily.    . nicotine (NICODERM CQ - DOSED IN MG/24 HOURS) 21 mg/24hr patch Place 1 patch (21 mg total) onto the skin daily. 28 patch 0  .  OLANZapine (ZYPREXA) 15 MG tablet Take 1 tablet (15 mg total) by mouth at bedtime. 90 tablet 0  . omeprazole (PRILOSEC) 40 MG capsule TAKE 1 CAPSULE(40 MG) BY MOUTH TWICE DAILY BEFORE A MEAL 180 capsule 0  . ondansetron (ZOFRAN) 8 MG tablet Take 1 tablet (8 mg total) by mouth 2 (two) times daily as needed for refractory nausea / vomiting. Start on day 3 after chemo. 30 tablet 1  . Probiotic Product (PROBIOTIC DAILY PO) Take by mouth.    . prochlorperazine (COMPAZINE) 10 MG tablet Take 1 tablet (10 mg total) by mouth every 6 (six) hours as needed (Nausea or vomiting). 30 tablet 1  . topiramate (TOPAMAX) 25 MG tablet Take 1 tablet (25 mg total) by mouth at bedtime. 90 tablet 0  . traZODone (DESYREL) 100 MG tablet Take 1 tablet (100 mg total) by mouth at bedtime. 90 tablet 0  . venlafaxine XR (EFFEXOR XR) 37.5 MG 24 hr capsule Take three capsule (112.5 mg total) by mouth daily. 90 capsule 0   No current facility-administered medications for this encounter.    REVIEW OF SYSTEMS:  A 10+ POINT REVIEW OF SYSTEMS WAS OBTAINED including neurology, dermatology, psychiatry, cardiac, respiratory, lymph, extremities, GI, GU, musculoskeletal, constitutional, reproductive, HEENT.  She reports pain in the left upper breast area.  She denies any nipple discharge or bleeding.  She reports discomfort in the vulvar region but no significant bleeding.   PHYSICAL EXAM:  height is 5' 5.5" (1.664 m) and weight is 211 lb (95.7 kg). Her temperature is 97.4 F (36.3 C) (abnormal). Her blood pressure is 105/70 and her pulse is 84. Her respiration is 18 and oxygen saturation is 100%.   General: Alert and oriented, in no acute distress HEENT: Head is normocephalic. Extraocular movements are intact.  Neck: Neck is supple, no palpable cervical or supraclavicular lymphadenopathy.  Unable to palpate adenopathy in the right neck region. Heart: Regular in rate and rhythm with no murmurs, rubs, or gallops. Chest: Clear to  auscultation bilaterally, with no rhonchi, wheezes, or rales. Abdomen: Soft, nontender, nondistended, with no rigidity or guarding. Extremities: No cyanosis or edema. Lymphatics: see Neck Exam Skin: No concerning lesions. Musculoskeletal: symmetric strength and muscle tone throughout. Neurologic: Cranial nerves II through XII are grossly intact. No obvious focalities. Speech is fluent. Coordination is intact. Psychiatric: Judgment and insight  are intact. Affect is appropriate. Right breast: No palpable mass, nipple discharge, or bleeding. Left breast: Large palpable mass in the upper inner quadrant estimated to be approximately 3 x 4 cm.  Biopsy sites are present in the upper inner quadrant,  lower inner quadrant and left axillary region On pelvic examination the patient is noted to have an ulcerated lesion along the midline perineum, estimated to be approximately 4.5 cm extending towards the anus.  No obvious involvement of the anus or rectum.  On speculum exam no obvious extension into the vaginal area and with palpation no obvious extension into the vaginal area.  ECOG = 1  0 - Asymptomatic (Fully active, able to carry on all predisease activities without restriction)  1 - Symptomatic but completely ambulatory (Restricted in physically strenuous activity but ambulatory and able to carry out work of a light or sedentary nature. For example, light housework, office work)  2 - Symptomatic, <50% in bed during the day (Ambulatory and capable of all self care but unable to carry out any work activities. Up and about more than 50% of waking hours)  3 - Symptomatic, >50% in bed, but not bedbound (Capable of only limited self-care, confined to bed or chair 50% or more of waking hours)  4 - Bedbound (Completely disabled. Cannot carry on any self-care. Totally confined to bed or chair)  5 - Death   Eustace Pen MM, Creech RH, Tormey DC, et al. [id number]). "Toxicity and response criteria of the [facility]". [place] Oncol. 5 (6): 649-55  LABORATORY DATA:  Lab Results  Component Value Date   WBC 9.4 07/09/2018   HGB 12.9 07/09/2018   HCT 37.2 07/09/2018   MCV 84 07/09/2018   PLT 356 07/09/2018   NEUTROABS 4,982 11/10/2016   Lab Results  Component Value Date   NA 141 10/09/2018   K 4.1 10/09/2018   CL 103 10/09/2018   CO2 22 10/09/2018   GLUCOSE 81 10/09/2018   CREATININE 0.72 10/09/2018   CALCIUM 10.1 10/09/2018      RADIOGRAPHY: MR BREAST BILATERAL W WO CONTRAST INC CAD  Result Date: 02/28/2020 ADDENDUM REPORT: 02/28/2020 10:38 AM ADDENDUM: There is an additional laterality error in the original report. In the body of the report under the right breast section, the linear enhancement is in the medial right breast and not the left. Electronically Signed   By: Lajean Manes M.D.   On: 02/28/2020 10:38   NM PET Image Initial (PI) Skull Base To Thigh  Result Date: 02/08/2020 CLINICAL DATA:  Initial treatment strategy for vulvar cancer. EXAM: NUCLEAR MEDICINE PET SKULL BASE TO THIGH TECHNIQUE: 11.0 mCi F-18 FDG was injected intravenously. Full-ring PET imaging was performed from the skull base to thigh after the radiotracer. CT data was obtained and used for attenuation correction and anatomic localization. Fasting blood glucose: 91 mg/dl COMPARISON:  CT abdomen 03/08/2016 FINDINGS: Mediastinal blood pool activity: SUV max 2.7 Liver activity: SUV max 3.4 NECK: Low-grade tonsillar activity is likely physiologic but mildly asymmetric with the left palatine tonsillar activity at maximum SUV 7.0 on the right side maximum SUV 5.6. A right eccentric level Ia lymph node measures 0.9 cm in short axis on image 37/4 with maximum SUV 7.7. A right level IIa lymph node measuring 0.8 cm on image 25/4 has maximum SUV of 4.4. A small right level III lymph node measuring 0.7 cm in short axis on image 34/4 has maximum SUV of 4.8. Incidental CT findings: none  CHEST: A 1.7 cm mass  medially in the left breast on image 59/4 has a maximum SUV of 12.6, high suspicion for malignancy. 1.2 cm left axillary node on image 55/4, maximum SUV 5.5, suspicious for axillary spread. Adjacent smaller left axillary node 0.7 cm in short axis on image 55/4 with maximum SUV 4.5. The patient does have a history of COVID vaccination in the left arm, but the vaccination was in February of 2021. A small (5 mm) left upper subpectoral lymph node is faintly hypermetabolic with maximum SUV 4.8. Incidental CT findings: 3 mm right upper lobe subpleural nodule on image 17/8, technically too small to characterize. ABDOMEN/PELVIS: Accentuated activity along the midline of the perineum posteriorly with maximum SUV 10.8, could be from peroneal lesion or incontinence of urine containing FDG. Faint accentuated activity along the vaginal vestibule region, maximum SUV 6.0. Physiologic activity in the proximal colon. Incidental CT findings: Kidney lower pole mild aortoiliac atherosclerotic calcification. Suspected nonobstructive 2 mm punctate left renal calculus on image 119/4. SKELETON: No significant abnormal hypermetabolic activity in this region. Incidental CT findings: Degenerative disc disease and degenerative endplate sclerosis at U9-W1. IMPRESSION: 1. Hypermetabolic 1.7 cm medial left breast mass, concerning for synchronous breast cancer. Mildly hypermetabolic left axillary lymph nodes are less specific; these could be from local nodal spread but could also be related to the patient's COVID-19 vaccination which can cause accentuated nodal activity. The patient is currently 4 months out from vaccination, and accordingly the likelihood that this is due to vaccination is less. Referral for diagnostic mammography with attention to the medial left breast and with attention to this PET-CT report is recommended. 2. Hypermetabolic lymph nodes in the right neck, most strikingly a moderately hypermetabolic right level Ia lymph node  with maximum SUV 7.7. Strictly speaking, malignancy is not excluded although this would be an unusual pathway for the patient's volar malignancy or for the patient's left breast lesion. 3. Activity along the perineum which may be related to the vulvar lesion, postoperative findings from the vulvar lesion, or possibly simply incontinence of urine containing excreted FDG. 4. Other imaging findings of potential clinical significance: Nonobstructive left nephrolithiasis. Aortic Atherosclerosis (ICD10-I70.0). Chronic degenerative disc disease at L5-S1. Electronically Signed   By: Van Clines M.D.   On: 02/08/2020 18:36   US BREAST LTD UNI LEFT INC AXILLA  Addendum Date: 02/18/2020   ADDENDUM REPORT: 02/18/2020 14:54 CLINICAL DATA:  44 year old female presenting with a new lump in the left breast. This mass was seen on recent PET CT performed for initial evaluation of vulvar cancer. Electronically Signed   By: Audie Pinto M.D.   On: 02/18/2020 14:54   Result Date: 02/18/2020 CLINICAL DATA:  44 year old female presenting with a new lump in the left breast. This mass was also seen on a recent PET-CT performed for initial evaluation of over cancer. EXAM: DIGITAL DIAGNOSTIC BILATERAL MAMMOGRAM WITH CAD AND TOMO ULTRASOUND LEFT BREAST COMPARISON:  Previous exam(s). ACR Breast Density Category b: There are scattered areas of fibroglandular density. FINDINGS: Mammogram: Right breast: No suspicious mass, distortion, or microcalcifications are identified to suggest presence of malignancy. Left breast: A BB marks the palpable site of concern in the upper inner left breast. There is an irregular spiculated mass measuring approximately 1.9 cm with associated distortion. There are linear pleomorphic calcifications extending from the mass medially and posteriorly spanning approximately 3.6 cm. In total the mass and calcifications span roughly 4.3 cm. There is suggestion of skin retraction associated with the mass  mammographically. There is an enlarged left axillary lymph node measuring approximately 1.6 cm. Mammographic images were processed with CAD. On physical exam, I palpate a fixed discrete mass at the site of concern in the upper slightly inner left breast. Ultrasound: Targeted ultrasound is performed in the left breast at 11 o'clock 4 cm from the nipple at the palpable site of concern demonstrating an irregular hypoechoic mass with spiculated margins measuring 3.1 x 1.7 x 2.4 cm. There is internal vascularity. This corresponds to the mammographic finding. Targeted ultrasound of the left axilla demonstrates a single abnormal lymph node with cortical thickening measuring 0.6 cm. IMPRESSION: 1. Palpable left breast mass at 11 o'clock measuring 3.1 cm is highly suspicious. There are associated suspicious calcifications which extend posterior to the mass. The overall area of concern measures approximately 4.3 cm. There is suggestion of tethering to the skin mammographically. 2.  Single abnormal left axillary lymph node. RECOMMENDATION: 1. Ultrasound-guided core needle biopsy of the left breast mass at 11 o'clock. 2. Stereotactic core needle biopsy at the posterior aspect of the calcifications to determine extent of disease. 3. Ultrasound-guided core needle biopsy of the abnormal left axillary lymph node. I have discussed the findings and recommendations with the patient. If applicable, a reminder letter will be sent to the patient regarding the next appointment. BI-RADS CATEGORY  5: Highly suggestive of malignancy. Electronically Signed: By: Audie Pinto M.D. On: 02/18/2020 10:11   MM DIAG BREAST TOMO BILATERAL  Addendum Date: 02/18/2020   ADDENDUM REPORT: 02/18/2020 14:54 CLINICAL DATA:  44 year old female presenting with a new lump in the left breast. This mass was seen on recent PET CT performed for initial evaluation of vulvar cancer. Electronically Signed   By: Audie Pinto M.D.   On: 02/18/2020 14:54    Result Date: 02/18/2020 CLINICAL DATA:  44 year old female presenting with a new lump in the left breast. This mass was also seen on a recent PET-CT performed for initial evaluation of over cancer. EXAM: DIGITAL DIAGNOSTIC BILATERAL MAMMOGRAM WITH CAD AND TOMO ULTRASOUND LEFT BREAST COMPARISON:  Previous exam(s). ACR Breast Density Category b: There are scattered areas of fibroglandular density. FINDINGS: Mammogram: Right breast: No suspicious mass, distortion, or microcalcifications are identified to suggest presence of malignancy. Left breast: A BB marks the palpable site of concern in the upper inner left breast. There is an irregular spiculated mass measuring approximately 1.9 cm with associated distortion. There are linear pleomorphic calcifications extending from the mass medially and posteriorly spanning approximately 3.6 cm. In total the mass and calcifications span roughly 4.3 cm. There is suggestion of skin retraction associated with the mass mammographically. There is an enlarged left axillary lymph node measuring approximately 1.6 cm. Mammographic images were processed with CAD. On physical exam, I palpate a fixed discrete mass at the site of concern in the upper slightly inner left breast. Ultrasound: Targeted ultrasound is performed in the left breast at 11 o'clock 4 cm from the nipple at the palpable site of concern demonstrating an irregular hypoechoic mass with spiculated margins measuring 3.1 x 1.7 x 2.4 cm. There is internal vascularity. This corresponds to the mammographic finding. Targeted ultrasound of the left axilla demonstrates a single abnormal lymph node with cortical thickening measuring 0.6 cm. IMPRESSION: 1. Palpable left breast mass at 11 o'clock measuring 3.1 cm is highly suspicious. There are associated suspicious calcifications which extend posterior to the mass. The overall area of concern measures approximately 4.3 cm. There is suggestion of tethering to the skin  mammographically. 2.  Single abnormal left axillary lymph node. RECOMMENDATION: 1. Ultrasound-guided core needle biopsy of the left breast mass at 11 o'clock. 2. Stereotactic core needle biopsy at the posterior aspect of the calcifications to determine extent of disease. 3. Ultrasound-guided core needle biopsy of the abnormal left axillary lymph node. I have discussed the findings and recommendations with the patient. If applicable, a reminder letter will be sent to the patient regarding the next appointment. BI-RADS CATEGORY  5: Highly suggestive of malignancy. Electronically Signed: By: Audie Pinto M.D. On: 02/18/2020 10:11   ECHOCARDIOGRAM COMPLETE  Result Date: 03/02/2020    ECHOCARDIOGRAM REPORT   Patient Name:   Yuna C Earlywine Date of Exam: 03/02/2020 Medical Rec #:  [id number]       Height:       65.5 in Accession #:    [id number]      Weight:       212.7 lb Date of Birth:  September 19, 1975       BSA:          2.042 m Patient Age:    14 years        BP:           137/76 mmHg Patient Gender: F               HR:           106 bpm. Exam Location:  Outpatient Procedure: 2D Echo, Cardiac Doppler, Color Doppler and Strain Analysis Indications:    Chemo evaluation  History:        Patient has no prior history of Echocardiogram examinations.                 Risk Factors:Hypertension and Current Smoker. Breast cancer,                 Vulvar cancer.  Sonographer:    Dustin Flock Referring Phys: [id number] [place]  1. Left ventricular ejection fraction, by estimation, is 55 to 60%. The left ventricle has normal function. The left ventricle has no regional wall motion abnormalities. Left ventricular diastolic parameters are consistent with Grade I diastolic dysfunction (impaired relaxation). The average left ventricular global longitudinal strain is -13.7 %.  2. Right ventricular systolic function is normal. The right ventricular size is normal.  3. The mitral valve is normal in structure. No  evidence of mitral valve regurgitation. No evidence of mitral stenosis.  4. The aortic valve is grossly normal. Aortic valve regurgitation is not visualized. No aortic stenosis is present. FINDINGS  Left Ventricle: Left ventricular ejection fraction, by estimation, is 55 to 60%. The left ventricle has normal function. The left ventricle has no regional wall motion abnormalities. The average left ventricular global longitudinal strain is -13.7 %. The left ventricular internal cavity size was normal in size. There is no left ventricular hypertrophy. Left ventricular diastolic parameters are consistent with Grade I diastolic dysfunction (impaired relaxation). Right Ventricle: The right ventricular size is normal. No increase in right ventricular wall thickness. Right ventricular systolic function is normal. Left Atrium: Left atrial size was normal in size. Right Atrium: Right atrial size was normal in size. Pericardium: There is no evidence of pericardial effusion. Mitral Valve: The mitral valve is normal in structure. No evidence of mitral valve regurgitation. No evidence of mitral valve stenosis. Tricuspid Valve: The tricuspid valve is normal in structure. Tricuspid valve regurgitation is trivial. Aortic Valve: The aortic valve is grossly normal. Aortic valve regurgitation is not visualized.  No aortic stenosis is present. Pulmonic Valve: The pulmonic valve was normal in structure. Pulmonic valve regurgitation is not visualized. No evidence of pulmonic stenosis. Aorta: The aortic root and ascending aorta are structurally normal, with no evidence of dilitation. IAS/Shunts: The atrial septum is grossly normal.  LEFT VENTRICLE PLAX 2D LVIDd:         4.50 cm  Diastology LVIDs:         3.20 cm  LV e' lateral:   10.70 cm/s LV PW:         1.10 cm  LV E/e' lateral: 5.5 LV IVS:        1.00 cm  LV e' medial:    7.18 cm/s LVOT diam:     2.10 cm  LV E/e' medial:  8.2 LV SV:         60 LV SV Index:   29       2D Longitudinal  Strain LVOT Area:     3.46 cm 2D Strain GLS Avg:     -13.7 %  RIGHT VENTRICLE RV Basal diam:  2.80 cm RV S prime:     12.60 cm/s TAPSE (M-mode): 2.6 cm LEFT ATRIUM             Index       RIGHT ATRIUM          Index LA diam:        2.50 cm 1.22 cm/m  RA Area:     9.13 cm LA Vol (A2C):   28.1 ml 13.76 ml/m RA Volume:   19.00 ml 9.30 ml/m LA Vol (A4C):   40.0 ml 19.59 ml/m LA Biplane Vol: 36.3 ml 17.77 ml/m  AORTIC VALVE LVOT Vmax:   104.00 cm/s LVOT Vmean:  56.700 cm/s LVOT VTI:    0.173 m  AORTA Ao Root diam: 2.70 cm MITRAL VALVE MV Area (PHT): 6.17 cm    SHUNTS MV Decel Time: 123 msec    Systemic VTI:  0.17 m MV E velocity: 59.10 cm/s  Systemic Diam: 2.10 cm MV A velocity: 75.40 cm/s MV E/A ratio:  0.78 Mertie Moores MD Electronically signed by Mertie Moores MD Signature Date/Time: 03/02/2020/1:42:56 PM    Final    [place] AXILLARY NODE CORE BIOPSY LEFT  Addendum Date: 02/19/2020   ADDENDUM REPORT: 02/19/2020 13:14 ADDENDUM: Pathology revealed GRADE III INVASIVE MAMMARY CARCINOMA, MAMMARY CARCINOMA IN-SITU of the LEFT breast, upper inner quadrant, 11 o'clock, (ribbon clip). This was found to be concordant by Dr. Nolon Nations. Pathology revealed METASTATIC CARCINOMA INVOLVING A LYMPH NODE of the LEFT axilla. This was found to be concordant by Dr. Nolon Nations. Pathology results were discussed with the patient by telephone. The patient reported doing well after the biopsies with tenderness at the sites. Post biopsy instructions and care were reviewed and questions were answered. The patient was encouraged to call [facility] for any additional concerns. My direct phone number was provided for the patient. Surgical consultation has been arranged with Dr. Alphonsa Overall at [facility] on February 21, 2020. Medical Oncology referral has been arranged with Dr. Nicholas Lose at [facility] on February 25, 2020. The patient is scheduled for a LEFT stereotatic guided  biopsy on February 20, 2020 to determine extent of disease. Pathology results reported by Terie Purser, RN on 02/19/2020. Electronically Signed   By: Nolon Nations M.D.   On: 02/19/2020 13:14   Result Date: 02/19/2020 CLINICAL DATA:  Patient presents for  ultrasound-guided core biopsy of mass in the 11 o'clock location of the LEFT breast and enlarged LEFT axillary lymph node. EXAM: [place] CORE NEEDLE BIOPSY LEFT BREAST US AXILLARY NODE CORE BIOPSY LEFT COMPARISON:  Previous exam(s). PROCEDURE: I met with the patient and we discussed the procedure of ultrasound-guided biopsy, including benefits and alternatives. We discussed the high likelihood of a successful procedure. We discussed the risks of the procedure, including infection, bleeding, tissue injury, clip migration, and inadequate sampling. Informed written consent was given. The usual time-out protocol was performed immediately prior to the procedure. Site 1: LEFT breast 11 o'clock location, a ribbon shaped clip, UPPER INNER QUADRANT Using sterile technique and 1% Lidocaine as local anesthetic, under direct ultrasound visualization, a 12 gauge spring-loaded device was used to perform biopsy of mass in the 11 o'clock location of the LEFT breast using a LATERAL to MEDIAL approach. At the conclusion of the procedure ribbon shaped tissue marker clip was deployed into the biopsy cavity. Site 2: LEFT axilla, Q shaped clip Using sterile technique and 1% Lidocaine as local anesthetic, under direct ultrasound visualization, a 14 gauge spring-loaded device was used to perform biopsy of enlarged LEFT axillary lymph node using a LATERAL to MEDIAL approach. At the conclusion of the procedure Q shaped tissue marker clip was deployed into the biopsy cavity. Follow up 2 view mammogram was performed and dictated separately. IMPRESSION: Ultrasound guided biopsy of mass in the 11 o'clock location of the LEFT breast and enlarged LEFT axillary lymph node. No apparent complications.  Electronically Signed: By: Nolon Nations M.D. On: 02/18/2020 12:23   MM CLIP PLACEMENT LEFT  Result Date: 02/20/2020 CLINICAL DATA:  Post stereotactic guided biopsy of calcifications in the inner left breast. EXAM: DIAGNOSTIC LEFT MAMMOGRAM POST STEREOTACTIC BIOPSY COMPARISON:  Previous exams. FINDINGS: Mammographic images were obtained following stereotactic guided biopsy of calcifications in the inner left breast. A coil shaped biopsy marking clip is present at the site of the biopsied calcifications within the inner left breast. These calcifications are difficult to visualize due to their amorphous/indistinct appearance however appear to extend 1.2 cm posterior to the coil shaped biopsy marking clip, with mass and calcifications all together spanning 4 cm. IMPRESSION: 1. Coil shaped biopsy marking clip at site of biopsied calcifications within the inner left breast. These calcifications are felt to extend 1.2 cm posterior to the coil shaped biopsy marking clip. Final Assessment: Post Procedure Mammograms for Marker Placement Electronically Signed   By: Everlean Alstrom M.D.   On: 02/20/2020 13:06   MM CLIP PLACEMENT LEFT  Result Date: 02/18/2020 CLINICAL DATA:  Status post ultrasound-guided core biopsy of mass in the LEFT location of the LEFT breast. Status post ultrasound-guided core biopsy of enlarged LEFT axillary lymph node. EXAM: DIAGNOSTIC LEFT MAMMOGRAM POST ULTRASOUND BIOPSY x2 COMPARISON:  Previous exam(s). FINDINGS: Mammographic images were obtained following ultrasound guided biopsy of mass in the 11 o'clock location of the LEFT breast and placement of a ribbon shaped clip. The biopsy marking clip is in expected position at the site of biopsy. Following biopsy of LEFT axillary lymph node, a Q shaped clip was placed is identified in the LEFT axilla as expected. IMPRESSION: Tissue marker clips are in the expected locations after biopsy. Final Assessment: Post Procedure Mammograms for Marker  Placement Electronically Signed   By: Nolon Nations M.D.   On: 02/18/2020 12:25   MM LT BREAST BX W LOC DEV 1ST LESION IMAGE BX SPEC STEREO GUIDE  Addendum Date: 02/25/2020  ADDENDUM REPORT: 02/21/2020 14:20 ADDENDUM: Pathology revealed GRADE III INVASIVE DUCTAL CARCINOMA, DUCTAL CARCINOMA IN SITU WITH CALCIFICATIONS AND NECROSIS of the LEFT breast, lower inner. This was found to be concordant by Dr. Everlean Alstrom. Pathology results were not discussed with the patient per her request. The patient stated she preferred viewing results on My Chart. The patient was given number to [place] office and encouraged to call back at her convenience to discuss results. The patient reported doing well after the biopsy with tenderness at the site. Post biopsy instructions and care were reviewed and questions were answered. The patient was encouraged to call [facility] for any additional concerns. Surgical consultation with Dr. Alphonsa Overall at [facility] on February 21, 2020. Pathology results reported by Stacie Acres RN on 02/21/2020. Electronically Signed   By: Everlean Alstrom M.D.   On: 02/21/2020 14:20   Result Date: 02/25/2020 CLINICAL DATA:  44 year old female presents for stereotactic guided biopsy of calcifications within the inner slightly lower left breast. These calcifications extend posteriorly from recent biopsy-proven malignancy. EXAM: LEFT BREAST STEREOTACTIC CORE NEEDLE BIOPSY COMPARISON:  Previous exams. FINDINGS: The patient and I discussed the procedure of stereotactic-guided biopsy including benefits and alternatives. We discussed the high likelihood of a successful procedure. We discussed the risks of the procedure including infection, bleeding, tissue injury, clip migration, and inadequate sampling. Informed written consent was given. The usual time out protocol was performed immediately prior to the procedure. Using sterile technique and 1%  Lidocaine as local anesthetic, under stereotactic guidance, a 9 gauge vacuum assisted device was used to perform core needle biopsy of the calcifications in the slightly lower inner left breast using a medial to lateral approach. Specimen radiograph was performed showing the presence of calcifications. Specimens with calcifications are identified for pathology. Lesion quadrant: Lower inner At the conclusion of the procedure, a coil shaped tissue marker clip was deployed into the biopsy cavity. Follow-up 2-view mammogram was performed and dictated separately. IMPRESSION: Stereotactic-guided biopsy of the calcifications in the slightly lower inner left breast. No apparent complications. Electronically Signed: By: Everlean Alstrom M.D. On: 02/20/2020 12:56   [place] LT BREAST BX W LOC DEV 1ST LESION IMG BX SPEC US GUIDE  Addendum Date: 02/19/2020   ADDENDUM REPORT: 02/19/2020 13:14 ADDENDUM: Pathology revealed GRADE III INVASIVE MAMMARY CARCINOMA, MAMMARY CARCINOMA IN-SITU of the LEFT breast, upper inner quadrant, 11 o'clock, (ribbon clip). This was found to be concordant by Dr. Nolon Nations. Pathology revealed METASTATIC CARCINOMA INVOLVING A LYMPH NODE of the LEFT axilla. This was found to be concordant by Dr. Nolon Nations. Pathology results were discussed with the patient by telephone. The patient reported doing well after the biopsies with tenderness at the sites. Post biopsy instructions and care were reviewed and questions were answered. The patient was encouraged to call [facility] for any additional concerns. My direct phone number was provided for the patient. Surgical consultation has been arranged with Dr. Alphonsa Overall at [facility] on February 21, 2020. Medical Oncology referral has been arranged with Dr. Nicholas Lose at [facility] on February 25, 2020. The patient is scheduled for a LEFT stereotatic guided biopsy on February 20, 2020 to determine extent of  disease. Pathology results reported by Terie Purser, RN on 02/19/2020. Electronically Signed   By: Nolon Nations M.D.   On: 02/19/2020 13:14   Result Date: 02/19/2020 CLINICAL DATA:  Patient presents for ultrasound-guided core  biopsy of mass in the 11 o'clock location of the LEFT breast and enlarged LEFT axillary lymph node. EXAM: [place] CORE NEEDLE BIOPSY LEFT BREAST US AXILLARY NODE CORE BIOPSY LEFT COMPARISON:  Previous exam(s). PROCEDURE: I met with the patient and we discussed the procedure of ultrasound-guided biopsy, including benefits and alternatives. We discussed the high likelihood of a successful procedure. We discussed the risks of the procedure, including infection, bleeding, tissue injury, clip migration, and inadequate sampling. Informed written consent was given. The usual time-out protocol was performed immediately prior to the procedure. Site 1: LEFT breast 11 o'clock location, a ribbon shaped clip, UPPER INNER QUADRANT Using sterile technique and 1% Lidocaine as local anesthetic, under direct ultrasound visualization, a 12 gauge spring-loaded device was used to perform biopsy of mass in the 11 o'clock location of the LEFT breast using a LATERAL to MEDIAL approach. At the conclusion of the procedure ribbon shaped tissue marker clip was deployed into the biopsy cavity. Site 2: LEFT axilla, Q shaped clip Using sterile technique and 1% Lidocaine as local anesthetic, under direct ultrasound visualization, a 14 gauge spring-loaded device was used to perform biopsy of enlarged LEFT axillary lymph node using a LATERAL to MEDIAL approach. At the conclusion of the procedure Q shaped tissue marker clip was deployed into the biopsy cavity. Follow up 2 view mammogram was performed and dictated separately. IMPRESSION: Ultrasound guided biopsy of mass in the 11 o'clock location of the LEFT breast and enlarged LEFT axillary lymph node. No apparent complications. Electronically Signed: By: Nolon Nations M.D.  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  Given the close proximity to the rectum/anus the patient is not a good candidate for surgery.  She would be a good candidate for definitive course of radiation therapy.  Given the PET scan findings her treatment is complicated.  She has below appears to have multifocal left breast cancer with biopsies pending of the right breast and right neck area.  Patient has been seen by medical oncology and will proceed with neoadjuvant chemotherapy to address this issue.  During the patient's neoadjuvant chemotherapy she will proceed with her lower pelvic radiation therapy.  Chemotherapy will be adjusted to account for concomitant pelvic radiation therapy.  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  Patient will proceed with neoadjuvant chemotherapy to address this issue.  At a later date she will be evaluated for surgery.  If she is breast conserving candidate then she will proceed with adjuvant radiation therapy to the left breast which will also include the axillary region given her biopsy-proven lymph node metastasis.  If she is not a candidate for breast conserving surgery I would also recommend postmastectomy radiation therapy along the left side given the extent of disease as well as lymph node metastasis.  Right breast biopsy is pending, right neck biopsy pending  Today, I talked to the patient and family about the findings and work-up thus far.  We discussed the natural history of vulvar cancer and general treatment, highlighting the role of radiotherapy in the management.  We discussed the available radiation techniques, and focused on the details of logistics and delivery.  We reviewed the anticipated acute and late sequelae associated with radiation in this setting.  The patient was encouraged to ask questions that I answered to the best  of my ability.  A patient consent  form was discussed and signed.  We retained a copy for our records. The patient would like to proceed with radiation and will be scheduled for CT simulation.  PLAN: CT simulation soon with treatment to begin next week concomitant with neoadjuvant chemotherapy for the patient's locally advanced left breast cancer.  Anticipate 6 weeks of radiation therapy for management of the patient's perineal/vulvar lesion.  Total time spent in this encounter was 85 minutes which included reviewing the patient's most recent consultations, mammogram, ultrasound, PET scan, MRI, biopsies, physical examination, and documentation as well as the complexities of management of synchronous advanced breast and vulvar malignancies.   ------------------------------------------------  Blair Promise, PhD, MD  This document serves as a record of services personally performed by Gery Pray, MD. It was created on his behalf by Clerance Lav, a trained medical scribe. The creation of this record is based on the scribe's personal observations and the provider's statements to them. This document has been checked and approved by the attending provider.

## 2020-03-02 NOTE — Telephone Encounter (Signed)
Sandra Miller called and said she has had a lot of nausea for the past few days.  She is wondering who to contact about antinausea medications.  Advised her that Dr. Lindi Adie has sent anti emetics to [facility].  She said she is going to pick them up today.

## 2020-03-02 NOTE — Progress Notes (Signed)
Met with patient at registration to introduce myself as Arboriculturist and to offer available resources.  Discussed one-time $1000 Radio broadcast assistant to assist with personal expenses while going through treatment. Also discussed available copay assistance If ded/OOP have not been met for [place].  Gave her my card if interested in applying and for any additional financial questions or concerns.

## 2020-03-02 NOTE — Progress Notes (Signed)
[facility] CSW Progress Note  Clinical Social Worker met with patient to provide resources for assistance. Signed up for [employer] fund and gave first disbursement. Patient signed release for [employer] and CSW submitted referral to [employer] for assistance applying for disability.  CSW also provided and reviewed applications for financial assistance from foundations including [employer], [employer], [employer], [employer], and [employer]. Patient will review and contact CSW when ready to submit or if she has any questions.     E  LCSW 

## 2020-03-02 NOTE — Progress Notes (Signed)
Patient called regarding Advertising account executive.  Discussed what is needed to apply. She has my name and number to contact when available to provide documentation and meet to complete paperwork.

## 2020-03-02 NOTE — Progress Notes (Signed)
  Echocardiogram 2D Echocardiogram has been performed.  Matilde Bash 03/02/2020, 11:36 AM

## 2020-03-03 ENCOUNTER — Ambulatory Visit (HOSPITAL_COMMUNITY)
Admission: RE | Admit: 2020-03-03 | Discharge: 2020-03-03 | Disposition: A | Discharge status: Home / Self Care | Payer: No Typology Code available for payment source | Source: Ambulatory Visit | Attending: Hematology and Oncology | Admitting: Hematology and Oncology

## 2020-03-03 ENCOUNTER — Other Ambulatory Visit (HOSPITAL_COMMUNITY)
Admission: RE | Admit: 2020-03-03 | Discharge: 2020-03-03 | Disposition: A | Discharge status: Home / Self Care | Payer: No Typology Code available for payment source | Source: Ambulatory Visit | Attending: Surgery | Admitting: Surgery

## 2020-03-03 ENCOUNTER — Ambulatory Visit
Admission: RE | Admit: 2020-03-03 | Discharge: 2020-03-03 | Disposition: A | Discharge status: Home / Self Care | Payer: No Typology Code available for payment source | Source: Ambulatory Visit | Attending: Radiation Oncology | Admitting: Radiation Oncology

## 2020-03-03 ENCOUNTER — Encounter: Payer: Self-pay | Admitting: Licensed Clinical Social Worker

## 2020-03-03 ENCOUNTER — Encounter: Payer: Self-pay | Admitting: Radiation Oncology

## 2020-03-03 ENCOUNTER — Other Ambulatory Visit (HOSPITAL_COMMUNITY): Payer: No Typology Code available for payment source

## 2020-03-03 ENCOUNTER — Encounter (HOSPITAL_COMMUNITY): Payer: Self-pay

## 2020-03-03 ENCOUNTER — Other Ambulatory Visit: Payer: Self-pay

## 2020-03-03 VITALS — BP 105/70 | HR 84 | Temp 97.4°F | Resp 18 | Ht 65.5 in | Wt 211.0 lb

## 2020-03-03 DIAGNOSIS — Z87442 Personal history of urinary calculi: Secondary | ICD-10-CM | POA: Diagnosis not present

## 2020-03-03 DIAGNOSIS — F319 Bipolar disorder, unspecified: Secondary | ICD-10-CM | POA: Diagnosis not present

## 2020-03-03 DIAGNOSIS — Z79899 Other long term (current) drug therapy: Secondary | ICD-10-CM | POA: Diagnosis not present

## 2020-03-03 DIAGNOSIS — Z8711 Personal history of peptic ulcer disease: Secondary | ICD-10-CM | POA: Diagnosis not present

## 2020-03-03 DIAGNOSIS — K219 Gastro-esophageal reflux disease without esophagitis: Secondary | ICD-10-CM | POA: Diagnosis not present

## 2020-03-03 DIAGNOSIS — I10 Essential (primary) hypertension: Secondary | ICD-10-CM | POA: Insufficient documentation

## 2020-03-03 DIAGNOSIS — C50212 Malignant neoplasm of upper-inner quadrant of left female breast: Secondary | ICD-10-CM | POA: Diagnosis not present

## 2020-03-03 DIAGNOSIS — F17210 Nicotine dependence, cigarettes, uncomplicated: Secondary | ICD-10-CM | POA: Insufficient documentation

## 2020-03-03 DIAGNOSIS — C519 Malignant neoplasm of vulva, unspecified: Secondary | ICD-10-CM | POA: Insufficient documentation

## 2020-03-03 DIAGNOSIS — Z01818 Encounter for other preprocedural examination: Secondary | ICD-10-CM | POA: Diagnosis not present

## 2020-03-03 DIAGNOSIS — Z7952 Long term (current) use of systemic steroids: Secondary | ICD-10-CM | POA: Diagnosis not present

## 2020-03-03 DIAGNOSIS — N766 Ulceration of vulva: Secondary | ICD-10-CM

## 2020-03-03 DIAGNOSIS — Z170 Estrogen receptor positive status [ER+]: Secondary | ICD-10-CM

## 2020-03-03 DIAGNOSIS — C773 Secondary and unspecified malignant neoplasm of axilla and upper limb lymph nodes: Secondary | ICD-10-CM | POA: Insufficient documentation

## 2020-03-03 HISTORY — DX: Malignant (primary) neoplasm, unspecified: C80.1

## 2020-03-03 LAB — BASIC METABOLIC PANEL
Anion gap: 12 (ref 5–15)
BUN: 13 mg/dL (ref 6–20)
CO2: 20 mmol/L — ABNORMAL LOW (ref 22–32)
Calcium: 9.6 mg/dL (ref 8.9–10.3)
Chloride: 106 mmol/L (ref 98–111)
Creatinine, Ser: 0.75 mg/dL (ref 0.44–1.00)
GFR calc Af Amer: >60 mL/min (ref >60)
GFR calc non Af Amer: >60 mL/min (ref >60)
Glucose, Bld: 101 mg/dL — ABNORMAL HIGH (ref 70–99)
Potassium: 3.4 mmol/L — ABNORMAL LOW (ref 3.5–5.1)
Sodium: 138 mmol/L (ref 135–145)

## 2020-03-03 LAB — CBC WITH DIFFERENTIAL/PLATELET
Abs Immature Granulocytes: 0.03 10*3/uL (ref 0.00–0.07)
Basophils Absolute: 0 10*3/uL (ref 0.0–0.1)
Basophils Relative: 0 %
Eosinophils Absolute: 0 10*3/uL (ref 0.0–0.5)
Eosinophils Relative: 0 %
HCT: 42.1 % (ref 36.0–46.0)
Hemoglobin: 14.1 g/dL (ref 12.0–15.0)
Immature Granulocytes: 0 %
Lymphocytes Relative: 32 %
Lymphs Abs: 3.5 10*3/uL (ref 0.7–4.0)
MCH: 30 pg (ref 26.0–34.0)
MCHC: 33.5 g/dL (ref 30.0–36.0)
MCV: 89.6 fL (ref 80.0–100.0)
Monocytes Absolute: 0.5 10*3/uL (ref 0.1–1.0)
Monocytes Relative: 4 %
Neutro Abs: 7 10*3/uL (ref 1.7–7.7)
Neutrophils Relative %: 64 %
Platelets: 327 10*3/uL (ref 150–400)
RBC: 4.7 MIL/uL (ref 3.87–5.11)
RDW: 14.6 % (ref 11.5–15.5)
WBC: 11 10*3/uL — ABNORMAL HIGH (ref 4.0–10.5)
nRBC: 0 % (ref 0.0–0.2)

## 2020-03-03 LAB — PROTIME-INR
INR: 0.9 (ref 0.8–1.2)
Prothrombin Time: 12 seconds (ref 11.4–15.2)

## 2020-03-03 LAB — SARS CORONAVIRUS 2 (TAT 6-24 HRS): SARS Coronavirus 2: NEGATIVE

## 2020-03-03 MED ORDER — FENTANYL CITRATE (PF) 100 MCG/2ML IJ SOLN
INTRAMUSCULAR | Status: DC | PRN
  Administered 2020-03-03: 25 ug via INTRAVENOUS
  Administered 2020-03-03: 50 ug via INTRAVENOUS

## 2020-03-03 MED ORDER — SODIUM CHLORIDE 0.9 % IV SOLN: INTRAVENOUS | Status: DC

## 2020-03-03 MED ORDER — MIDAZOLAM HCL 2 MG/2ML IJ SOLN
INTRAMUSCULAR | Status: DC | PRN
  Administered 2020-03-03: 1 mg via INTRAVENOUS
  Administered 2020-03-03: 0.5 mg via INTRAVENOUS

## 2020-03-03 MED ORDER — LIDOCAINE HCL 1 % IJ SOLN
INTRAMUSCULAR | Status: AC
  Filled 2020-03-03: qty 20

## 2020-03-03 NOTE — Progress Notes (Signed)
Sandra Miller Psychosocial Distress Screening Clinical Social Work  Clinical Social Work was referred by distress screening protocol.  The patient scored a 10 on the Psychosocial Distress Thermometer which indicates severe distress. Clinical Social Worker spoke with patien on 6/25 and met with patient on 6/28 to assess for distress and other psychosocial needs.  CSW has begun addressing financial stressors with patient. She is also already connected with psychiatry for history of anxiety and bipolar. CSW will continue to follow to support emotional and resource needs.  ONCBCN DISTRESS SCREENING 03/03/2020  Screening Type Initial Screening  Distress experienced in past week (1-10) 10  Referral to clinical social work Yes  Other Talked with Sandra Miller on 6/28    Clinical Social Worker follow up needed: Yes.    If yes, follow up plan: CSW will continue to check-in with patient throughout treatment to offer emotional and resource support. Referrals already in place for [facility] (disability application)   Locklyn Henriquez, [place], LCSW

## 2020-03-03 NOTE — Procedures (Signed)
Interventional Radiology Procedure Note  Procedure: [place] submental node bx  Complications: None  Estimated Blood Loss: min  Findings: 18 g cores of the submental slightly thickened node by PET.  Rt cervical nodes look normal by [place] survey

## 2020-03-03 NOTE — Consult Note (Signed)
Chief Complaint: Patient was seen in consultation today for image guided cervical lymph node biopsy  Referring Physician(s): Roca  Supervising Physician: Daryll Brod  Patient Status: [facility] - Out-pt  History of Present Illness: Sandra Miller is a 44 y.o. female smoker with history of vulvar cancer diagnosed in May of this year and now with newly diagnosed left breast carcinoma.  Subsequent PET scan performed on 02/07/2020 revealed: 1. Hypermetabolic 1.7 cm medial left breast mass, concerning for synchronous breast cancer. Mildly hypermetabolic left axillary lymph nodes are less specific; these could be from local nodal spread but could also be related to the patient's COVID-19 vaccination which can cause accentuated nodal activity. The patient is currently 4 months out from vaccination, and accordingly the likelihood that this is due to vaccination is less. Referral for diagnostic mammography with attention to the medial left breast and with attention to this PET-CT report is recommended. 2. Hypermetabolic lymph nodes in the right neck, most strikingly a moderately hypermetabolic right level Ia lymph node with maximum SUV 7.7. Strictly speaking, malignancy is not excluded although this would be an unusual pathway for the patient's volar malignancy or for the patient's left breast lesion. 3. Activity along the perineum which may be related to the vulvar lesion, postoperative findings from the vulvar lesion, or possibly simply incontinence of urine containing excreted FDG. 4. Other imaging findings of potential clinical significance: Nonobstructive left nephrolithiasis. Aortic Atherosclerosis (ICD10-I70.0). Chronic degenerative disc disease at L5-S1.  She presents today for image guided cervical lymph node biopsy for further evaluation.  Additional medical history as below. Past Medical History:  Diagnosis Date  . Bipolar 1 disorder ([place])   . Cancer ([place])   .  Complication of anesthesia    wakes up during procedures  . GAD (generalized anxiety disorder)   . Genital HSV    currently per pt  no break out 03-22-2016   . GERD (gastroesophageal reflux disease)   . Hiatal hernia   . History of cervical dysplasia    2012 laser ablation  . History of esophageal dilatation    for dysphasia -- x2 dilated  . History of gastric ulcer   . History of Helicobacter pylori infection    remote hx  . History of hidradenitis suppurativa    "gets all over body intermittantly"    . History of hypertension    no issue since stopped drinking alcohol 2014  . History of kidney stones   . History of panic attacks   . Iron deficiency anemia   . Left ureteral stone   . OCD (obsessive compulsive disorder)   . PTSD (post-traumatic stress disorder)   . Recovering alcoholic in remission ([place])    since 2014  . RLS (restless legs syndrome)   . Smokers' cough ([place])   . Urgency of urination   . Yeast infection involving the vagina and surrounding area    secondary to taking antibiotic    Past Surgical History:  Procedure Laterality Date  . CESAREAN SECTION  1995   w/  Bilateral Tubal Ligation  . COLONOSCOPY  last one 08-09-2013  . CYSTOSCOPY W/ URETERAL STENT PLACEMENT Left 03/29/2016   Procedure: CYSTOSCOPY WITH STENT REPLACEMENT;  Surgeon: Nickie Retort, MD;  Location: [facility];  Service: Urology;  Laterality: Left;  . CYSTOSCOPY WITH RETROGRADE PYELOGRAM, URETEROSCOPY AND STENT PLACEMENT Left 03/08/2016   Procedure: CYSTOSCOPY WITH  LEFT RETROGRADE PYELOGRAM, AND STENT PLACEMENT;  Surgeon: Nickie Retort, MD;  Location: [facility];  Service: Urology;  Laterality: Left;  . CYSTOSCOPY/RETROGRADE/URETEROSCOPY/STONE EXTRACTION WITH BASKET Left 03/29/2016   Procedure: CYSTOSCOPY/RETROGRADE/URETEROSCOPY/STONE EXTRACTION WITH BASKET;  Surgeon: Nickie Retort, MD;  Location: [facility];  Service: Urology;  Laterality: Left;  .  ENDOMETRIAL ABLATION W/ NOVASURE  04-01-2010  . ESOPHAGOGASTRODUODENOSCOPY  last one 08-09-2013  . KNEE ARTHROSCOPY Left as teen  . LASER ABLATION OF THE CERVIX  2012 approx  . TRANSTHORACIC ECHOCARDIOGRAM  05-19-2006   lvsf normal, ef 55-65%, there was mild flattening of the interventricular septum during diastoli/  RV size at upper limits normal  . TUBAL LIGATION    . WISDOM TOOTH EXTRACTION  age 7 's    Allergies: Depakote [divalproex sodium], Minocycline, and Aspirin  Medications: Prior to Admission medications   Medication Sig Start Date End Date Taking? Authorizing Provider  acetaminophen (TYLENOL) 500 MG tablet Take 1,000 mg by mouth every 6 (six) hours as needed for moderate pain or fever.   Yes [provider]  gabapentin (NEURONTIN) 300 MG capsule Take 1 capsule (300 mg total) by mouth daily. 01/08/20  Yes Gatha Mayer, MD  lisinopril-hydrochlorothiazide (ZESTORETIC) 20-12.5 MG tablet Take 1 tablet by mouth daily. 10/31/19  Yes Rutherford Guys, MD  LORazepam (ATIVAN) 0.5 MG tablet Take 1 tablet (0.5 mg total) by mouth at bedtime as needed for sleep. 02/27/20  Yes Nicholas Lose, MD  Multiple Vitamin (MULTIVITAMIN) tablet Take 1 tablet by mouth daily.   Yes [provider]  OLANZapine (ZYPREXA) 15 MG tablet Take 1 tablet (15 mg total) by mouth at bedtime. 12/18/19 12/17/20 Yes Arfeen, Arlyce Harman, MD  omeprazole (PRILOSEC) 40 MG capsule TAKE 1 CAPSULE(40 MG) BY MOUTH TWICE DAILY BEFORE A MEAL 01/08/20  Yes Gatha Mayer, MD  Probiotic Product (PROBIOTIC DAILY PO) Take by mouth.   Yes [provider]  topiramate (TOPAMAX) 25 MG tablet Take 1 tablet (25 mg total) by mouth at bedtime. 12/18/19 12/17/20 Yes Arfeen, Arlyce Harman, MD  traZODone (DESYREL) 100 MG tablet Take 1 tablet (100 mg total) by mouth at bedtime. 12/18/19  Yes Arfeen, Arlyce Harman, MD  venlafaxine XR (EFFEXOR XR) 37.5 MG 24 hr capsule Take three capsule (112.5 mg total) by mouth daily. 03/02/20  Yes Arfeen, Arlyce Harman, MD  dexamethasone (DECADRON) 4 MG tablet Take 1 tablet (4 mg total) by mouth daily. Start the day after chemotherapy for 2 days. 02/27/20   Nicholas Lose, MD  lidocaine-prilocaine (EMLA) cream Apply to affected area once 02/27/20   Nicholas Lose, MD  LORazepam (ATIVAN) 1 MG tablet Take 1 tablet (1 mg total) by mouth 3 (three) times daily as needed for anxiety. 01/28/20   Arfeen, Arlyce Harman, MD  nicotine (NICODERM CQ - DOSED IN MG/24 HOURS) 21 mg/24hr patch Place 1 patch (21 mg total) onto the skin daily. 01/29/20   Everitt Amber, MD  ondansetron (ZOFRAN) 8 MG tablet Take 1 tablet (8 mg total) by mouth 2 (two) times daily as needed for refractory nausea / vomiting. Start on day 3 after chemo. 02/27/20   Nicholas Lose, MD  prochlorperazine (COMPAZINE) 10 MG tablet Take 1 tablet (10 mg total) by mouth every 6 (six) hours as needed (Nausea or vomiting). 02/27/20   Nicholas Lose, MD     Family History  Problem Relation Age of Onset  . Heart disease Father   . Lung cancer Father   . Alcohol abuse Father   . Heart disease Mother   . Depression Mother   .  Anxiety disorder Mother   . Drug abuse Brother   . Alcohol abuse Brother   . Drug abuse Brother   . ADD / ADHD Brother   . Colon polyps Brother   . Stomach cancer Paternal Grandfather   . Diabetes Maternal Grandfather   . Diabetes Paternal Grandmother   . Kidney disease Maternal Uncle   . Cirrhosis Cousin        alcoholic  . Anxiety disorder Maternal Aunt   . Depression Maternal Aunt   . Cancer Cousin        cervical cancer    Social History   Socioeconomic History  . Marital status: Single    Spouse name: Not on file  . Number of children: Not on file  . Years of education: Not on file  . Highest education level: Not on file  Occupational History  . Not on file  Tobacco Use  . Smoking status: Current Every Day Smoker    Packs/day: 0.50    Years: 22.00    Pack years: 11.00    Types: Cigarettes  . Smokeless tobacco: Never Used  .  Tobacco comment: Recently started a smoking cessation class.   Vaping Use  . Vaping Use: Never used  Substance and Sexual Activity  . Alcohol use: Not Currently    Alcohol/week: 0.0 standard drinks    Comment:  hx alcohollism  in remission since 2014  . Drug use: No    Comment: hx  marijuana use  . Sexual activity: Yes    Partners: Male    Birth control/protection: Surgical  Other Topics Concern  . Not on file  Social History Narrative   Former [facility] patient.      Was on disability at one point.   Return to the workforce.  40 hours a week at [employer], 10 hours a week on the weekends at [employer] in [place], [employer] at night 10 to 12 hours a week.      Has grown children, she lives alone with a pet, continues to smoke no alcohol or drug use at this time      History of EtOH abuse and THC use.         Social Determinants of Health   Financial Resource Strain:   . Difficulty of Paying Living Expenses:   Food Insecurity: No Food Insecurity  . Worried About Programme researcher, broadcasting/film/video in the Last Year: Never true  . Ran Out of Food in the Last Year: Never true  Transportation Needs: No Transportation Needs  . Lack of Transportation (Medical): No  . Lack of Transportation (Non-Medical): No  Physical Activity:   . Days of Exercise per Week:   . Minutes of Exercise per Session:   Stress:   . Feeling of Stress :   Social Connections:   . Frequency of Communication with Friends and Family:   . Frequency of Social Gatherings with Friends and Family:   . Attends Religious Services:   . Active Member of Clubs or Organizations:   . Attends Banker Meetings:   Marland Kitchen Marital Status:      Review of Systems currently denies fever, chest pain, dyspnea, cough, abdominal/back pain, nausea, vomiting or bleeding.  She does have headaches.  Vital Signs: Blood pressure 121/79, heart rate 95, temp 99.6, respirations 17, O2 sat 99% room air   Physical Exam awake,  alert.  Chest clear to auscultation bilaterally.  Heart with regular rate and rhythm.  Abdomen soft,  positive bowel sounds, nontender.  No significant lower extremity edema.  Imaging: MR BREAST BILATERAL W WO CONTRAST INC CAD  Result Date: 02/28/2020 ADDENDUM REPORT: 02/28/2020 10:38 AM ADDENDUM: There is an additional laterality error in the original report. In the body of the report under the right breast section, the linear enhancement is in the medial right breast and not the left. Electronically Signed   By: Lajean Manes M.D.   On: 02/28/2020 10:38   NM PET Image Initial (PI) Skull Base To Thigh  Result Date: 02/08/2020 CLINICAL DATA:  Initial treatment strategy for vulvar cancer. EXAM: NUCLEAR MEDICINE PET SKULL BASE TO THIGH TECHNIQUE: 11.0 mCi F-18 FDG was injected intravenously. Full-ring PET imaging was performed from the skull base to thigh after the radiotracer. CT data was obtained and used for attenuation correction and anatomic localization. Fasting blood glucose: 91 mg/dl COMPARISON:  CT abdomen 03/08/2016 FINDINGS: Mediastinal blood pool activity: SUV max 2.7 Liver activity: SUV max 3.4 NECK: Low-grade tonsillar activity is likely physiologic but mildly asymmetric with the left palatine tonsillar activity at maximum SUV 7.0 on the right side maximum SUV 5.6. A right eccentric level Ia lymph node measures 0.9 cm in short axis on image 37/4 with maximum SUV 7.7. A right level IIa lymph node measuring 0.8 cm on image 25/4 has maximum SUV of 4.4. A small right level III lymph node measuring 0.7 cm in short axis on image 34/4 has maximum SUV of 4.8. Incidental CT findings: none CHEST: A 1.7 cm mass medially in the left breast on image 59/4 has a maximum SUV of 12.6, high suspicion for malignancy. 1.2 cm left axillary node on image 55/4, maximum SUV 5.5, suspicious for axillary spread. Adjacent smaller left axillary node 0.7 cm in short axis on image 55/4 with maximum SUV 4.5. The patient does  have a history of COVID vaccination in the left arm, but the vaccination was in February of 2021. A small (5 mm) left upper subpectoral lymph node is faintly hypermetabolic with maximum SUV 4.8. Incidental CT findings: 3 mm right upper lobe subpleural nodule on image 17/8, technically too small to characterize. ABDOMEN/PELVIS: Accentuated activity along the midline of the perineum posteriorly with maximum SUV 10.8, could be from peroneal lesion or incontinence of urine containing FDG. Faint accentuated activity along the vaginal vestibule region, maximum SUV 6.0. Physiologic activity in the proximal colon. Incidental CT findings: Kidney lower pole mild aortoiliac atherosclerotic calcification. Suspected nonobstructive 2 mm punctate left renal calculus on image 119/4. SKELETON: No significant abnormal hypermetabolic activity in this region. Incidental CT findings: Degenerative disc disease and degenerative endplate sclerosis at Z6-X0. IMPRESSION: 1. Hypermetabolic 1.7 cm medial left breast mass, concerning for synchronous breast cancer. Mildly hypermetabolic left axillary lymph nodes are less specific; these could be from local nodal spread but could also be related to the patient's COVID-19 vaccination which can cause accentuated nodal activity. The patient is currently 4 months out from vaccination, and accordingly the likelihood that this is due to vaccination is less. Referral for diagnostic mammography with attention to the medial left breast and with attention to this PET-CT report is recommended. 2. Hypermetabolic lymph nodes in the right neck, most strikingly a moderately hypermetabolic right level Ia lymph node with maximum SUV 7.7. Strictly speaking, malignancy is not excluded although this would be an unusual pathway for the patient's volar malignancy or for the patient's left breast lesion. 3. Activity along the perineum which may be related to the vulvar lesion, postoperative findings  from the vulvar  lesion, or possibly simply incontinence of urine containing excreted FDG. 4. Other imaging findings of potential clinical significance: Nonobstructive left nephrolithiasis. Aortic Atherosclerosis (ICD10-I70.0). Chronic degenerative disc disease at L5-S1. Electronically Signed   By: Van Clines M.D.   On: 02/08/2020 18:36   US BREAST LTD UNI LEFT INC AXILLA  Addendum Date: 02/18/2020   ADDENDUM REPORT: 02/18/2020 14:54 CLINICAL DATA:  44 year old female presenting with a new lump in the left breast. This mass was seen on recent PET CT performed for initial evaluation of vulvar cancer. Electronically Signed   By: Audie Pinto M.D.   On: 02/18/2020 14:54   Result Date: 02/18/2020 CLINICAL DATA:  44 year old female presenting with a new lump in the left breast. This mass was also seen on a recent PET-CT performed for initial evaluation of over cancer. EXAM: DIGITAL DIAGNOSTIC BILATERAL MAMMOGRAM WITH CAD AND TOMO ULTRASOUND LEFT BREAST COMPARISON:  Previous exam(s). ACR Breast Density Category b: There are scattered areas of fibroglandular density. FINDINGS: Mammogram: Right breast: No suspicious mass, distortion, or microcalcifications are identified to suggest presence of malignancy. Left breast: A BB marks the palpable site of concern in the upper inner left breast. There is an irregular spiculated mass measuring approximately 1.9 cm with associated distortion. There are linear pleomorphic calcifications extending from the mass medially and posteriorly spanning approximately 3.6 cm. In total the mass and calcifications span roughly 4.3 cm. There is suggestion of skin retraction associated with the mass mammographically. There is an enlarged left axillary lymph node measuring approximately 1.6 cm. Mammographic images were processed with CAD. On physical exam, I palpate a fixed discrete mass at the site of concern in the upper slightly inner left breast. Ultrasound: Targeted ultrasound is performed in  the left breast at 11 o'clock 4 cm from the nipple at the palpable site of concern demonstrating an irregular hypoechoic mass with spiculated margins measuring 3.1 x 1.7 x 2.4 cm. There is internal vascularity. This corresponds to the mammographic finding. Targeted ultrasound of the left axilla demonstrates a single abnormal lymph node with cortical thickening measuring 0.6 cm. IMPRESSION: 1. Palpable left breast mass at 11 o'clock measuring 3.1 cm is highly suspicious. There are associated suspicious calcifications which extend posterior to the mass. The overall area of concern measures approximately 4.3 cm. There is suggestion of tethering to the skin mammographically. 2.  Single abnormal left axillary lymph node. RECOMMENDATION: 1. Ultrasound-guided core needle biopsy of the left breast mass at 11 o'clock. 2. Stereotactic core needle biopsy at the posterior aspect of the calcifications to determine extent of disease. 3. Ultrasound-guided core needle biopsy of the abnormal left axillary lymph node. I have discussed the findings and recommendations with the patient. If applicable, a reminder letter will be sent to the patient regarding the next appointment. BI-RADS CATEGORY  5: Highly suggestive of malignancy. Electronically Signed: By: Audie Pinto M.D. On: 02/18/2020 10:11   MM DIAG BREAST TOMO BILATERAL  Addendum Date: 02/18/2020   ADDENDUM REPORT: 02/18/2020 14:54 CLINICAL DATA:  44 year old female presenting with a new lump in the left breast. This mass was seen on recent PET CT performed for initial evaluation of vulvar cancer. Electronically Signed   By: Audie Pinto M.D.   On: 02/18/2020 14:54   Result Date: 02/18/2020 CLINICAL DATA:  44 year old female presenting with a new lump in the left breast. This mass was also seen on a recent PET-CT performed for initial evaluation of over cancer. EXAM: DIGITAL DIAGNOSTIC BILATERAL MAMMOGRAM  WITH CAD AND TOMO ULTRASOUND LEFT BREAST COMPARISON:   Previous exam(s). ACR Breast Density Category b: There are scattered areas of fibroglandular density. FINDINGS: Mammogram: Right breast: No suspicious mass, distortion, or microcalcifications are identified to suggest presence of malignancy. Left breast: A BB marks the palpable site of concern in the upper inner left breast. There is an irregular spiculated mass measuring approximately 1.9 cm with associated distortion. There are linear pleomorphic calcifications extending from the mass medially and posteriorly spanning approximately 3.6 cm. In total the mass and calcifications span roughly 4.3 cm. There is suggestion of skin retraction associated with the mass mammographically. There is an enlarged left axillary lymph node measuring approximately 1.6 cm. Mammographic images were processed with CAD. On physical exam, I palpate a fixed discrete mass at the site of concern in the upper slightly inner left breast. Ultrasound: Targeted ultrasound is performed in the left breast at 11 o'clock 4 cm from the nipple at the palpable site of concern demonstrating an irregular hypoechoic mass with spiculated margins measuring 3.1 x 1.7 x 2.4 cm. There is internal vascularity. This corresponds to the mammographic finding. Targeted ultrasound of the left axilla demonstrates a single abnormal lymph node with cortical thickening measuring 0.6 cm. IMPRESSION: 1. Palpable left breast mass at 11 o'clock measuring 3.1 cm is highly suspicious. There are associated suspicious calcifications which extend posterior to the mass. The overall area of concern measures approximately 4.3 cm. There is suggestion of tethering to the skin mammographically. 2.  Single abnormal left axillary lymph node. RECOMMENDATION: 1. Ultrasound-guided core needle biopsy of the left breast mass at 11 o'clock. 2. Stereotactic core needle biopsy at the posterior aspect of the calcifications to determine extent of disease. 3. Ultrasound-guided core needle biopsy of  the abnormal left axillary lymph node. I have discussed the findings and recommendations with the patient. If applicable, a reminder letter will be sent to the patient regarding the next appointment. BI-RADS CATEGORY  5: Highly suggestive of malignancy. Electronically Signed: By: Audie Pinto M.D. On: 02/18/2020 10:11   ECHOCARDIOGRAM COMPLETE  Result Date: 03/02/2020    ECHOCARDIOGRAM REPORT   Patient Name:   Malli C Eskin Date of Exam: 03/02/2020 Medical Rec #:  [id number]       Height:       65.5 in Accession #:    [id number]      Weight:       212.7 lb Date of Birth:  09-Dec-1975       BSA:          2.042 m Patient Age:    43 years        BP:           137/76 mmHg Patient Gender: F               HR:           106 bpm. Exam Location:  Outpatient Procedure: 2D Echo, Cardiac Doppler, Color Doppler and Strain Analysis Indications:    Chemo evaluation  History:        Patient has no prior history of Echocardiogram examinations.                 Risk Factors:Hypertension and Current Smoker. Breast cancer,                 Vulvar cancer.  Sonographer:    Dustin Flock Referring Phys: [id number] [place]  1. Left ventricular ejection fraction, by estimation, is 55 to 60%.  The left ventricle has normal function. The left ventricle has no regional wall motion abnormalities. Left ventricular diastolic parameters are consistent with Grade I diastolic dysfunction (impaired relaxation). The average left ventricular global longitudinal strain is -13.7 %.  2. Right ventricular systolic function is normal. The right ventricular size is normal.  3. The mitral valve is normal in structure. No evidence of mitral valve regurgitation. No evidence of mitral stenosis.  4. The aortic valve is grossly normal. Aortic valve regurgitation is not visualized. No aortic stenosis is present. FINDINGS  Left Ventricle: Left ventricular ejection fraction, by estimation, is 55 to 60%. The left ventricle has normal function.  The left ventricle has no regional wall motion abnormalities. The average left ventricular global longitudinal strain is -13.7 %. The left ventricular internal cavity size was normal in size. There is no left ventricular hypertrophy. Left ventricular diastolic parameters are consistent with Grade I diastolic dysfunction (impaired relaxation). Right Ventricle: The right ventricular size is normal. No increase in right ventricular wall thickness. Right ventricular systolic function is normal. Left Atrium: Left atrial size was normal in size. Right Atrium: Right atrial size was normal in size. Pericardium: There is no evidence of pericardial effusion. Mitral Valve: The mitral valve is normal in structure. No evidence of mitral valve regurgitation. No evidence of mitral valve stenosis. Tricuspid Valve: The tricuspid valve is normal in structure. Tricuspid valve regurgitation is trivial. Aortic Valve: The aortic valve is grossly normal. Aortic valve regurgitation is not visualized. No aortic stenosis is present. Pulmonic Valve: The pulmonic valve was normal in structure. Pulmonic valve regurgitation is not visualized. No evidence of pulmonic stenosis. Aorta: The aortic root and ascending aorta are structurally normal, with no evidence of dilitation. IAS/Shunts: The atrial septum is grossly normal.  LEFT VENTRICLE PLAX 2D LVIDd:         4.50 cm  Diastology LVIDs:         3.20 cm  LV e' lateral:   10.70 cm/s LV PW:         1.10 cm  LV E/e' lateral: 5.5 LV IVS:        1.00 cm  LV e' medial:    7.18 cm/s LVOT diam:     2.10 cm  LV E/e' medial:  8.2 LV SV:         60 LV SV Index:   29       2D Longitudinal Strain LVOT Area:     3.46 cm 2D Strain GLS Avg:     -13.7 %  RIGHT VENTRICLE RV Basal diam:  2.80 cm RV S prime:     12.60 cm/s TAPSE (M-mode): 2.6 cm LEFT ATRIUM             Index       RIGHT ATRIUM          Index LA diam:        2.50 cm 1.22 cm/m  RA Area:     9.13 cm LA Vol (A2C):   28.1 ml 13.76 ml/m RA Volume:    19.00 ml 9.30 ml/m LA Vol (A4C):   40.0 ml 19.59 ml/m LA Biplane Vol: 36.3 ml 17.77 ml/m  AORTIC VALVE LVOT Vmax:   104.00 cm/s LVOT Vmean:  56.700 cm/s LVOT VTI:    0.173 m  AORTA Ao Root diam: 2.70 cm MITRAL VALVE MV Area (PHT): 6.17 cm    SHUNTS MV Decel Time: 123 msec    Systemic VTI:  0.17 m MV E  velocity: 59.10 cm/s  Systemic Diam: 2.10 cm MV A velocity: 75.40 cm/s MV E/A ratio:  0.78 Mertie Moores MD Electronically signed by Mertie Moores MD Signature Date/Time: 03/02/2020/1:42:56 PM    Final    [place] AXILLARY NODE CORE BIOPSY LEFT  Addendum Date: 02/19/2020   ADDENDUM REPORT: 02/19/2020 13:14 ADDENDUM: Pathology revealed GRADE III INVASIVE MAMMARY CARCINOMA, MAMMARY CARCINOMA IN-SITU of the LEFT breast, upper inner quadrant, 11 o'clock, (ribbon clip). This was found to be concordant by Dr. Nolon Nations. Pathology revealed METASTATIC CARCINOMA INVOLVING A LYMPH NODE of the LEFT axilla. This was found to be concordant by Dr. Nolon Nations. Pathology results were discussed with the patient by telephone. The patient reported doing well after the biopsies with tenderness at the sites. Post biopsy instructions and care were reviewed and questions were answered. The patient was encouraged to call [facility] for any additional concerns. My direct phone number was provided for the patient. Surgical consultation has been arranged with Dr. Alphonsa Overall at [facility] on February 21, 2020. Medical Oncology referral has been arranged with Dr. Nicholas Lose at [facility] on February 25, 2020. The patient is scheduled for a LEFT stereotatic guided biopsy on February 20, 2020 to determine extent of disease. Pathology results reported by Terie Purser, RN on 02/19/2020. Electronically Signed   By: Nolon Nations M.D.   On: 02/19/2020 13:14   Result Date: 02/19/2020 CLINICAL DATA:  Patient presents for ultrasound-guided core biopsy of mass in the 11 o'clock location of  the LEFT breast and enlarged LEFT axillary lymph node. EXAM: [place] CORE NEEDLE BIOPSY LEFT BREAST US AXILLARY NODE CORE BIOPSY LEFT COMPARISON:  Previous exam(s). PROCEDURE: I met with the patient and we discussed the procedure of ultrasound-guided biopsy, including benefits and alternatives. We discussed the high likelihood of a successful procedure. We discussed the risks of the procedure, including infection, bleeding, tissue injury, clip migration, and inadequate sampling. Informed written consent was given. The usual time-out protocol was performed immediately prior to the procedure. Site 1: LEFT breast 11 o'clock location, a ribbon shaped clip, UPPER INNER QUADRANT Using sterile technique and 1% Lidocaine as local anesthetic, under direct ultrasound visualization, a 12 gauge spring-loaded device was used to perform biopsy of mass in the 11 o'clock location of the LEFT breast using a LATERAL to MEDIAL approach. At the conclusion of the procedure ribbon shaped tissue marker clip was deployed into the biopsy cavity. Site 2: LEFT axilla, Q shaped clip Using sterile technique and 1% Lidocaine as local anesthetic, under direct ultrasound visualization, a 14 gauge spring-loaded device was used to perform biopsy of enlarged LEFT axillary lymph node using a LATERAL to MEDIAL approach. At the conclusion of the procedure Q shaped tissue marker clip was deployed into the biopsy cavity. Follow up 2 view mammogram was performed and dictated separately. IMPRESSION: Ultrasound guided biopsy of mass in the 11 o'clock location of the LEFT breast and enlarged LEFT axillary lymph node. No apparent complications. Electronically Signed: By: Nolon Nations M.D. On: 02/18/2020 12:23   MM CLIP PLACEMENT LEFT  Result Date: 02/20/2020 CLINICAL DATA:  Post stereotactic guided biopsy of calcifications in the inner left breast. EXAM: DIAGNOSTIC LEFT MAMMOGRAM POST STEREOTACTIC BIOPSY COMPARISON:  Previous exams. FINDINGS: Mammographic  images were obtained following stereotactic guided biopsy of calcifications in the inner left breast. A coil shaped biopsy marking clip is present at the site of the biopsied calcifications within the inner left breast. These  calcifications are difficult to visualize due to their amorphous/indistinct appearance however appear to extend 1.2 cm posterior to the coil shaped biopsy marking clip, with mass and calcifications all together spanning 4 cm. IMPRESSION: 1. Coil shaped biopsy marking clip at site of biopsied calcifications within the inner left breast. These calcifications are felt to extend 1.2 cm posterior to the coil shaped biopsy marking clip. Final Assessment: Post Procedure Mammograms for Marker Placement Electronically Signed   By: Everlean Alstrom M.D.   On: 02/20/2020 13:06   MM CLIP PLACEMENT LEFT  Result Date: 02/18/2020 CLINICAL DATA:  Status post ultrasound-guided core biopsy of mass in the LEFT location of the LEFT breast. Status post ultrasound-guided core biopsy of enlarged LEFT axillary lymph node. EXAM: DIAGNOSTIC LEFT MAMMOGRAM POST ULTRASOUND BIOPSY x2 COMPARISON:  Previous exam(s). FINDINGS: Mammographic images were obtained following ultrasound guided biopsy of mass in the 11 o'clock location of the LEFT breast and placement of a ribbon shaped clip. The biopsy marking clip is in expected position at the site of biopsy. Following biopsy of LEFT axillary lymph node, a Q shaped clip was placed is identified in the LEFT axilla as expected. IMPRESSION: Tissue marker clips are in the expected locations after biopsy. Final Assessment: Post Procedure Mammograms for Marker Placement Electronically Signed   By: Nolon Nations M.D.   On: 02/18/2020 12:25   MM LT BREAST BX W LOC DEV 1ST LESION IMAGE BX SPEC STEREO GUIDE  Addendum Date: 02/25/2020   ADDENDUM REPORT: 02/21/2020 14:20 ADDENDUM: Pathology revealed GRADE III INVASIVE DUCTAL CARCINOMA, DUCTAL CARCINOMA IN SITU WITH CALCIFICATIONS  AND NECROSIS of the LEFT breast, lower inner. This was found to be concordant by Dr. Everlean Alstrom. Pathology results were not discussed with the patient per her request. The patient stated she preferred viewing results on My Chart. The patient was given number to [place] office and encouraged to call back at her convenience to discuss results. The patient reported doing well after the biopsy with tenderness at the site. Post biopsy instructions and care were reviewed and questions were answered. The patient was encouraged to call [facility] for any additional concerns. Surgical consultation with Dr. Alphonsa Overall at [facility] on February 21, 2020. Pathology results reported by Stacie Acres RN on 02/21/2020. Electronically Signed   By: Everlean Alstrom M.D.   On: 02/21/2020 14:20   Result Date: 02/25/2020 CLINICAL DATA:  44 year old female presents for stereotactic guided biopsy of calcifications within the inner slightly lower left breast. These calcifications extend posteriorly from recent biopsy-proven malignancy. EXAM: LEFT BREAST STEREOTACTIC CORE NEEDLE BIOPSY COMPARISON:  Previous exams. FINDINGS: The patient and I discussed the procedure of stereotactic-guided biopsy including benefits and alternatives. We discussed the high likelihood of a successful procedure. We discussed the risks of the procedure including infection, bleeding, tissue injury, clip migration, and inadequate sampling. Informed written consent was given. The usual time out protocol was performed immediately prior to the procedure. Using sterile technique and 1% Lidocaine as local anesthetic, under stereotactic guidance, a 9 gauge vacuum assisted device was used to perform core needle biopsy of the calcifications in the slightly lower inner left breast using a medial to lateral approach. Specimen radiograph was performed showing the presence of calcifications. Specimens with  calcifications are identified for pathology. Lesion quadrant: Lower inner At the conclusion of the procedure, a coil shaped tissue marker clip was deployed into the biopsy cavity. Follow-up 2-view mammogram was performed and dictated separately.  IMPRESSION: Stereotactic-guided biopsy of the calcifications in the slightly lower inner left breast. No apparent complications. Electronically Signed: By: Everlean Alstrom M.D. On: 02/20/2020 12:56   [place] LT BREAST BX W LOC DEV 1ST LESION IMG BX SPEC US GUIDE  Addendum Date: 02/19/2020   ADDENDUM REPORT: 02/19/2020 13:14 ADDENDUM: Pathology revealed GRADE III INVASIVE MAMMARY CARCINOMA, MAMMARY CARCINOMA IN-SITU of the LEFT breast, upper inner quadrant, 11 o'clock, (ribbon clip). This was found to be concordant by Dr. Nolon Nations. Pathology revealed METASTATIC CARCINOMA INVOLVING A LYMPH NODE of the LEFT axilla. This was found to be concordant by Dr. Nolon Nations. Pathology results were discussed with the patient by telephone. The patient reported doing well after the biopsies with tenderness at the sites. Post biopsy instructions and care were reviewed and questions were answered. The patient was encouraged to call [facility] for any additional concerns. My direct phone number was provided for the patient. Surgical consultation has been arranged with Dr. Alphonsa Overall at [facility] on February 21, 2020. Medical Oncology referral has been arranged with Dr. Nicholas Lose at [facility] on February 25, 2020. The patient is scheduled for a LEFT stereotatic guided biopsy on February 20, 2020 to determine extent of disease. Pathology results reported by Terie Purser, RN on 02/19/2020. Electronically Signed   By: Nolon Nations M.D.   On: 02/19/2020 13:14   Result Date: 02/19/2020 CLINICAL DATA:  Patient presents for ultrasound-guided core biopsy of mass in the 11 o'clock location of the LEFT breast and enlarged LEFT axillary  lymph node. EXAM: [place] CORE NEEDLE BIOPSY LEFT BREAST US AXILLARY NODE CORE BIOPSY LEFT COMPARISON:  Previous exam(s). PROCEDURE: I met with the patient and we discussed the procedure of ultrasound-guided biopsy, including benefits and alternatives. We discussed the high likelihood of a successful procedure. We discussed the risks of the procedure, including infection, bleeding, tissue injury, clip migration, and inadequate sampling. Informed written consent was given. The usual time-out protocol was performed immediately prior to the procedure. Site 1: LEFT breast 11 o'clock location, a ribbon shaped clip, UPPER INNER QUADRANT Using sterile technique and 1% Lidocaine as local anesthetic, under direct ultrasound visualization, a 12 gauge spring-loaded device was used to perform biopsy of mass in the 11 o'clock location of the LEFT breast using a LATERAL to MEDIAL approach. At the conclusion of the procedure ribbon shaped tissue marker clip was deployed into the biopsy cavity. Site 2: LEFT axilla, Q shaped clip Using sterile technique and 1% Lidocaine as local anesthetic, under direct ultrasound visualization, a 14 gauge spring-loaded device was used to perform biopsy of enlarged LEFT axillary lymph node using a LATERAL to MEDIAL approach. At the conclusion of the procedure Q shaped tissue marker clip was deployed into the biopsy cavity. Follow up 2 view mammogram was performed and dictated separately. IMPRESSION: Ultrasound guided biopsy of mass in the 11 o'clock location of the LEFT breast and enlarged LEFT axillary lymph node. No apparent complications. Electronically Signed: By: Nolon Nations M.D. On: 02/18/2020 12:23    Labs:  CBC: Recent Labs    03/03/20 1146  WBC 11.0*  HGB 14.1  HCT 42.1  PLT 327    COAGS: Recent Labs    03/03/20 1146  INR 0.9    BMP: Recent Labs    03/03/20 1146  NA 138  K 3.4*  CL 106  CO2 20*  GLUCOSE 101*  BUN 13  CALCIUM 9.6  CREATININE 0.75  GFRNONAA  >60  GFRAA >60    LIVER FUNCTION TESTS: No results for input(s): BILITOT, AST, ALT, ALKPHOS, PROT, ALBUMIN in the last 8760 hours.  TUMOR MARKERS: No results for input(s): AFPTM, CEA, CA199, CHROMGRNA in the last 8760 hours.  Assessment and Plan: 44 y.o. female smoker with history of vulvar cancer diagnosed in May of this year and now with newly diagnosed left breast carcinoma.  Subsequent PET scan performed on 02/07/2020 revealed: 1. Hypermetabolic 1.7 cm medial left breast mass, concerning for synchronous breast cancer. Mildly hypermetabolic left axillary lymph nodes are less specific; these could be from local nodal spread but could also be related to the patient's COVID-19 vaccination which can cause accentuated nodal activity. The patient is currently 4 months out from vaccination, and accordingly the likelihood that this is due to vaccination is less. Referral for diagnostic mammography with attention to the medial left breast and with attention to this PET-CT report is recommended. 2. Hypermetabolic lymph nodes in the right neck, most strikingly a moderately hypermetabolic right level Ia lymph node with maximum SUV 7.7. Strictly speaking, malignancy is not excluded although this would be an unusual pathway for the patient's volar malignancy or for the patient's left breast lesion. 3. Activity along the perineum which may be related to the vulvar lesion, postoperative findings from the vulvar lesion, or possibly simply incontinence of urine containing excreted FDG. 4. Other imaging findings of potential clinical significance: Nonobstructive left nephrolithiasis. Aortic Atherosclerosis (ICD10-I70.0). Chronic degenerative disc disease at L5-S1.  She presents today for image guided cervical lymph node biopsy for further evaluation.Risks and benefits of procedure was discussed with the patient  including, but not limited to bleeding, infection, damage to adjacent structures or low  yield requiring additional tests.  All of the questions were answered and there is agreement to proceed.  Consent signed and in chart.     Thank you for this interesting consult.  I greatly enjoyed meeting Sherita C Bucher and look forward to participating in their care.  A copy of this report was sent to the requesting provider on this date.  Electronically Signed: D. Rowe Robert, PA-C 03/03/2020, 12:25 PM   I spent a total of 25 minutes    in face to face in clinical consultation, greater than 50% of which was counseling/coordinating care for image guided cervical lymph node biopsy

## 2020-03-03 NOTE — Discharge Instructions (Signed)
DO NOT SHOWER X 24 HRS  FOR ANY QUESTIONS OR CONCERNS CALL [phone]    Needle Biopsy, Care After These instructions tell you how to care for yourself after your procedure. Your doctor may also give you more specific instructions. Call your doctor if you have any problems or questions. What can I expect after the procedure? After the procedure, it is common to have:  Soreness.  Bruising.  Mild pain. Follow these instructions at home:   Return to your normal activities as told by your doctor. Ask your doctor what activities are safe for you.  Take over-the-counter and prescription medicines only as told by your doctor.  Wash your hands with soap and water before you change your bandage (dressing). If you cannot use soap and water, use hand sanitizer.  Follow instructions from your doctor about: ? How to take care of your puncture site. ? When and how to change your bandage. ? When to remove your bandage.  Check your puncture site every day for signs of infection. Watch for: ? Redness, swelling, or pain. ? Fluid or blood. ? Pus or a bad smell. ? Warmth.  Do not take baths, swim, or use a hot tub until your doctor approves. Ask your doctor if you may take showers. You may only be allowed to take sponge baths.  Keep all follow-up visits as told by your doctor. This is important. Contact a doctor if you have:  A fever.  Redness, swelling, or pain at the puncture site, and it lasts longer than a few days.  Fluid, blood, or pus coming from the puncture site.  Warmth coming from the puncture site. Get help right away if:  You have a lot of bleeding from the puncture site. Summary  After the procedure, it is common to have soreness, bruising, or mild pain at the puncture site.  Check your puncture site every day for signs of infection, such as redness, swelling, or pain.  Get help right away if you have severe bleeding from your puncture site. This information is  not intended to replace advice given to you by your health care provider. Make sure you discuss any questions you have with your health care provider. Document Revised: 09/04/2017 Document Reviewed: 09/04/2017 Elsevier Patient Education  2020 Else[employer]. Moderate Conscious Sedation, Adult, Care After These instructions provide you with information about caring for yourself after your procedure. Your health care provider may also give you more specific instructions. Your treatment has been planned according to current medical practices, but problems sometimes occur. Call your health care provider if you have any problems or questions after your procedure. What can I expect after the procedure? After your procedure, it is common:  To feel sleepy for several hours.  To feel clumsy and have poor balance for several hours.  To have poor judgment for several hours.  To vomit if you eat too soon. Follow these instructions at home: For at least 24 hours after the procedure:   Do not: ? Participate in activities where you could fall or become injured. ? Drive. ? Use heavy machinery. ? Drink alcohol. ? Take sleeping pills or medicines that cause drowsiness. ? Make important decisions or sign legal documents. ? Take care of children on your own.  Rest. Eating and drinking  Follow the diet recommended by your health care provider.  If you vomit: ? Drink water, juice, or soup when you can drink without vomiting. ? Make sure you have little or no   nausea before eating solid foods. General instructions  Have a responsible adult stay with you until you are awake and alert.  Take over-the-counter and prescription medicines only as told by your health care provider.  If you smoke, do not smoke without supervision.  Keep all follow-up visits as told by your health care provider. This is important. Contact a health care provider if:  You keep feeling nauseous or you keep vomiting.  You  feel light-headed.  You develop a rash.  You have a fever. Get help right away if:  You have trouble breathing. This information is not intended to replace advice given to you by your health care provider. Make sure you discuss any questions you have with your health care provider. Document Revised: 08/04/2017 Document Reviewed: 12/12/2015 Elsevier Patient Education  2020 Elsevier Inc.  

## 2020-03-04 ENCOUNTER — Other Ambulatory Visit: Payer: Self-pay | Admitting: Surgery

## 2020-03-04 NOTE — Progress Notes (Signed)
  Radiation Oncology         [phone]) [phone] ________________________________  Name: Sandra Miller MRN: [id number]  Date: 03/05/2020  DOB: 28-Sep-1975  SIMULATION AND TREATMENT PLANNING NOTE    ICD-10-CM   1. 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  Identity was confirmed.  All relevant records and images related to the planned course of therapy were reviewed.  The patient freely provided informed written consent to proceed with treatment after reviewing the details related to the planned course of therapy. The consent form was witnessed and verified by the simulation staff.  Then, the patient was set-up in a stable reproducible  supine position for radiation therapy.  CT images were obtained.  Surface markings were placed.  The CT images were loaded into the planning software.  Then the target and avoidance structures were contoured.  Treatment planning then occurred.  The radiation prescription was entered and confirmed.  Then, I designed and supervised the construction of a total of 4 medically necessary complex treatment devices.  I have requested : Intensity Modulated Radiotherapy (IMRT) is medically necessary for this case for the following reason:  Small bowel sparing and inguinal node coverage, bone marrow sparing.  I have ordered:dose calc.  PLAN:  The patient will receive 45 Gy in 25 fractions followed by a boost to the site of presentation 16.2 Gray in 9 fractions for a cumulative dose of 61.2 Gray.  In addition the patient will be receiving chemotherapy during the course of her treatment for management of her locally advanced left breast cancer.  Special Treatment Procedure Note: The patient will be receiving radiosensitizing chemotherapy. Given the potential of increased toxicities related to combined therapy and the necessity for close  monitoring of the patient and blood work, this constitutes a special treatment procedure.  -----------------------------------  Blair Promise, PhD, MD  This document serves as a record of services personally performed by Gery Pray, MD. It was created on his behalf by Clerance Lav, a trained medical scribe. The creation of this record is based on the scribe's personal observations and the provider's statements to them. This document has been checked and approved by the attending provider.

## 2020-03-05 ENCOUNTER — Other Ambulatory Visit: Payer: Self-pay | Admitting: Surgery

## 2020-03-05 ENCOUNTER — Encounter: Payer: Self-pay | Admitting: Hematology and Oncology

## 2020-03-05 ENCOUNTER — Ambulatory Visit
Admission: RE | Admit: 2020-03-05 | Discharge: 2020-03-05 | Disposition: A | Discharge status: Home / Self Care | Payer: No Typology Code available for payment source | Source: Ambulatory Visit | Attending: Surgery | Admitting: Surgery

## 2020-03-05 ENCOUNTER — Other Ambulatory Visit: Payer: Self-pay

## 2020-03-05 ENCOUNTER — Ambulatory Visit
Admission: RE | Admit: 2020-03-05 | Discharge: 2020-03-05 | Disposition: A | Discharge status: Home / Self Care | Payer: No Typology Code available for payment source | Source: Ambulatory Visit | Attending: Radiation Oncology | Admitting: Radiation Oncology

## 2020-03-05 ENCOUNTER — Other Ambulatory Visit: Payer: Self-pay | Admitting: Radiology

## 2020-03-05 ENCOUNTER — Encounter (HOSPITAL_BASED_OUTPATIENT_CLINIC_OR_DEPARTMENT_OTHER)
Admission: RE | Admit: 2020-03-05 | Discharge: 2020-03-05 | Disposition: A | Discharge status: Home / Self Care | Payer: No Typology Code available for payment source | Source: Ambulatory Visit | Attending: Surgery | Admitting: Surgery

## 2020-03-05 DIAGNOSIS — R9389 Abnormal findings on diagnostic imaging of other specified body structures: Secondary | ICD-10-CM

## 2020-03-05 DIAGNOSIS — Z8249 Family history of ischemic heart disease and other diseases of the circulatory system: Secondary | ICD-10-CM | POA: Diagnosis not present

## 2020-03-05 DIAGNOSIS — I10 Essential (primary) hypertension: Secondary | ICD-10-CM | POA: Diagnosis not present

## 2020-03-05 DIAGNOSIS — E7800 Pure hypercholesterolemia, unspecified: Secondary | ICD-10-CM | POA: Diagnosis not present

## 2020-03-05 DIAGNOSIS — C50212 Malignant neoplasm of upper-inner quadrant of left female breast: Secondary | ICD-10-CM

## 2020-03-05 DIAGNOSIS — E669 Obesity, unspecified: Secondary | ICD-10-CM | POA: Diagnosis not present

## 2020-03-05 DIAGNOSIS — K219 Gastro-esophageal reflux disease without esophagitis: Secondary | ICD-10-CM | POA: Diagnosis not present

## 2020-03-05 DIAGNOSIS — Z79899 Other long term (current) drug therapy: Secondary | ICD-10-CM | POA: Diagnosis not present

## 2020-03-05 DIAGNOSIS — G2581 Restless legs syndrome: Secondary | ICD-10-CM | POA: Diagnosis not present

## 2020-03-05 DIAGNOSIS — Z170 Estrogen receptor positive status [ER+]: Secondary | ICD-10-CM

## 2020-03-05 DIAGNOSIS — C519 Malignant neoplasm of vulva, unspecified: Secondary | ICD-10-CM | POA: Insufficient documentation

## 2020-03-05 DIAGNOSIS — F17200 Nicotine dependence, unspecified, uncomplicated: Secondary | ICD-10-CM | POA: Diagnosis not present

## 2020-03-05 DIAGNOSIS — F4310 Post-traumatic stress disorder, unspecified: Secondary | ICD-10-CM | POA: Diagnosis not present

## 2020-03-05 DIAGNOSIS — R928 Other abnormal and inconclusive findings on diagnostic imaging of breast: Secondary | ICD-10-CM

## 2020-03-05 DIAGNOSIS — Z510 Encounter for antineoplastic radiation therapy: Secondary | ICD-10-CM | POA: Insufficient documentation

## 2020-03-05 DIAGNOSIS — Z01812 Encounter for preprocedural laboratory examination: Secondary | ICD-10-CM | POA: Diagnosis present

## 2020-03-05 DIAGNOSIS — F319 Bipolar disorder, unspecified: Secondary | ICD-10-CM | POA: Diagnosis not present

## 2020-03-05 DIAGNOSIS — Z6835 Body mass index (BMI) 35.0-35.9, adult: Secondary | ICD-10-CM | POA: Diagnosis not present

## 2020-03-05 DIAGNOSIS — Z87442 Personal history of urinary calculi: Secondary | ICD-10-CM | POA: Diagnosis not present

## 2020-03-05 DIAGNOSIS — F411 Generalized anxiety disorder: Secondary | ICD-10-CM | POA: Diagnosis not present

## 2020-03-05 LAB — SURGICAL PATHOLOGY

## 2020-03-05 LAB — POCT PREGNANCY, URINE: Preg Test, Ur: NEGATIVE

## 2020-03-05 MED ORDER — GADOBUTROL 1 MMOL/ML IV SOLN
10.0000 mL | Freq: Once | INTRAVENOUS | Status: AC | PRN
  Administered 2020-03-05: 10 mL via INTRAVENOUS

## 2020-03-05 MED ORDER — FLUCONAZOLE 150 MG PO TABS
150.0000 mg | ORAL_TABLET | Freq: Every day | ORAL | 0 refills | Status: DC
Start: 2020-03-05 — End: 2020-04-02

## 2020-03-05 MED FILL — FLUCONAZOLE 150 MG TABS: 150 | 3 days supply | Qty: 2 | Fill #0

## 2020-03-05 NOTE — Progress Notes (Signed)

## 2020-03-05 NOTE — H&P (Signed)
Sandra Miller  Location: [facility] Patient #: [id number] DOB: 08/12/1976 Single / Language: Cleophus Molt / Race: Black or African American Female  History of Present Illness   The patient is a 44 year old female who presents with a complaint of breast mass.  The PCP is Dr. Hiram Comber  She comes with her sister Bobby Rumpf.  She was recently diagnosed with squamous cell carcinoma of the vulva. She has been seen by Dr. Denman George and Dr. Thurston Pounds [facility]). In staging her vulvar cancer, they did a PET scan.  She had a PET scan on 02/07/2020 which suggested a left breast abnormality. Hypermetabolic right cervical node. And activity along the perineum. Mammograms/US on 02/18/2020 showed left breast mass at 11 o'clock measuring 3.1 cm with suspicious calcifications which extend posterior to the mass. She had one enlarged 1.6 cm left axillary lymph node. She had a biopsy of her left breast on 02/18/2020 - UIQ, 11 o'clock - IDC, ER - 95%, PR - 95%, Ki67 - 75%, Her2Neu - positive, positive axillary lymph node She has no clear family history of breast cancer. She had a uterine ablation several years ago, but is not going through menopausal symptoms.   I discussed the options for breast cancer treatment with the patient. I discussed a multidisciplinary approach to the treatment of breast cancer, which includes medical oncology and radiation oncology. I discussed the surgical options of lumpectomy vs. mastectomy. If mastectomy, there is the possibility of reconstruction. I discussed the options of lymph node biopsy. The treatment plan depends on the pathologic staging of the tumor and the patient's personal wishes.  At this time I think she will need neoadjuvant chemotherapy. We will arrange an MRI of her breast as initial staging. The management of her right cervical lymph node I will discuss with Dr. Lindi Adie.  I discussed the  indications and potential complications of the power port placement.  The primary complications of the power port, include, but are not limited to, bleeding, infection, nerve injury, thrombosis, and pneumothorax.  From my standpoint, this will go first.  Plan: 1. MRI of breasts, 2. Neoadjuvant chemotherapy for breast cancer - to see Dr. Lindi Adie next week, 3. Power port placement, 4. Question about right cervical lymph node, 5. Genetics consultation  Review of Systems as stated in this history (HPI) or in the review of systems. Otherwise all other 12 point ROS are negative  Past Medical History: 1. Left breast cancer left breast on 02/18/2020 - UIQ, 11 o'clock - IDC, ER - 95%, PR - 95%, Ki67 - 75%, Her2Neu - positive, positive axillary lymph node To see Dr. Lindi Adie (6/22) and [place] (date unknown) 2. 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 Denman George adn Dr. Kennon Rounds saw Dr. Margaretmary Bayley at [facility] 3. History of bipolar disease and alcohol dependence Sees Dr. Dossie Der Arfeen 4. Smokes 5. HTN x 1 year 6. History of kidney stones - seen at [facility] 7. GERD - history of esophageal stricture Has been seen by Dr. Carlean Purl 8. She had a colonoscopy about 5 years ago - she has a brother with colon ca? 9. Obese - BMI - 35  Social History: Unmarried  Has 2 children - 29 and 13 yo Her sister, Bobby Rumpf, is with her  The patient's family history was non contributory.  Past Surgical History (Chanel Teressa Senter, [place]; 02/21/2020 8:40 AM) Breast Biopsy  Left. multiple Cesarean Section - 1  Knee Surgery  Left. Oral Surgery   Diagnostic Studies History (Chanel Teressa Senter, CMA; 02/21/2020 8:40 AM) Colonoscopy  5-10 years ago 1-5 years ago Mammogram  within last year >3 years ago Pap Smear  1-5 years ago  Allergies (Chanel Teressa Senter, CMA; 02/21/2020 8:41 AM) Aspirin Adult Low Dose *ANALGESICS - NonNarcotic*  Minocycline HCl  *Tetracyclines**  Depakote *ANTICONVULSANTS*  Allergies Reconciled   Medication History (Chanel Teressa Senter, CMA; 02/21/2020 8:42 AM) Gabapentin (300MG Capsule, Oral) Active. Lisinopril-hydroCHLOROthiazide (20-12.5MG Tablet, Oral) Active. LORazepam (1MG Tablet, Oral) Active. OLANZapine (15MG Tablet, Oral) Active. Omeprazole (40MG Capsule DR, Oral) Active. Topiramate (25MG Tablet, Oral) Active. TraZODone HCl (100MG Tablet, Oral) Active. Venlafaxine HCl ER (37.5MG Capsule ER 24HR, Oral) Active. Medications Reconciled  Social History Antonietta Jewel, CMA; 02/21/2020 8:40 AM) Caffeine use  Carbonated beverages. No alcohol use  Tobacco use  Current every day smoker, Current some day smoker.  Family History ([place], [place]; 02/21/2020 8:40 AM) Alcohol Abuse  Brother, Father. Arthritis  Mother. Cancer  Father. Colon Polyps  Brother. Depression  Mother. Diabetes Mellitus  Mother. Hypertension  Brother, Father, Mother, Sister. Migraine Headache  Daughter, Sister. Thyroid problems  Mother.  Pregnancy / Birth History Antonietta Jewel, [place]; 02/21/2020 8:40 AM) Age at menarche  80 years. Gravida  2 Maternal age  35-20 Para  2  Other Problems ([place], [place]; 02/21/2020 8:40 AM) Anxiety Disorder  Depression  General anesthesia - complications  High blood pressure  Hypercholesterolemia  Kidney Stone     Review of Systems (Chanel Nolan CMA; 02/21/2020 8:40 AM) General Present- Appetite Loss. Not Present- Chills, Fatigue, Fever, Night Sweats, Weight Gain and Weight Loss. Skin Not Present- Change in Wart/Mole, Dryness, Hives, Jaundice, New Lesions, Non-Healing Wounds, Rash and Ulcer. HEENT Not Present- Earache, Hearing Loss, Hoarseness, Nose Bleed, Oral Ulcers, Ringing in the Ears, Seasonal Allergies, Sinus Pain, Sore Throat, Visual Disturbances, Wears glasses/contact lenses and Yellow Eyes. Respiratory Not Present- Bloody sputum, Chronic Cough, Difficulty  Breathing, Snoring and Wheezing. Breast Present- Breast Mass. Not Present- Breast Pain, Nipple Discharge and Skin Changes. Cardiovascular Not Present- Chest Pain, Difficulty Breathing Lying Down, Leg Cramps, Palpitations, Rapid Heart Rate, Shortness of Breath and Swelling of Extremities. Gastrointestinal Present- Nausea. Not Present- Abdominal Pain, Bloating, Bloody Stool, Change in Bowel Habits, Chronic diarrhea, Constipation, Difficulty Swallowing, Excessive gas, Gets full quickly at meals, Hemorrhoids, Indigestion, Rectal Pain and Vomiting. Neurological Not Present- Decreased Memory, Fainting, Headaches, Numbness, Seizures, Tingling, Tremor, Trouble walking and Weakness. Psychiatric Present- Bipolar and Depression. Not Present- Anxiety, Change in Sleep Pattern, Fearful and Frequent crying. Endocrine Not Present- Cold Intolerance, Excessive Hunger, Hair Changes, Heat Intolerance, Hot flashes and New Diabetes. Hematology Not Present- Blood Thinners, Easy Bruising, Excessive bleeding, Gland problems, HIV and Persistent Infections.  Vitals (Chanel Nolan CMA; 02/21/2020 8:43 AM) 02/21/2020 8:42 AM Weight: 214.13 lb Height: 65.5in Body Surface Area: 2.05 m Body Mass Index: 35.09 kg/m  Temp.: 98.107F  Pulse: 111 (Regular)  BP: 134/84(Sitting, Left Arm, Standard)   Physical Exam  General: WN AA F who isalert and generally healthy appearing. She is wearing a mask. HEENT: Normal. Pupils equal.  Neck: Supple. No mass. No thyroid mass.  Lymph Nodes: No supraclavicular or cervical. I do not feel a right cervical lymph node - particular attention was paid to this side.  I think I can feel a left axillary node, about 2 cm - but she is tender and I cannot feel well.  Lungs: Clear to auscultation and symmetric breath sounds. Heart: RRR. No murmur or rub.  Breasts:  Right - no mass or nodule Left - she has a 4 x 6 cm fullness/mass in the UI!Q of the left brest  Abdomen: Soft. No  mass. No tenderness. No hernia. Normal bowel sounds. No abdominal scars. Rectal: Not done.  Extremities: Good strength and ROM in upper and lower extremities.  Neurologic: Grossly intact to motor and sensory function. Psychiatric: Has normal mood and affect. Behavior is normal.   Assessment & Plan  1.  BREAST CANCER, STAGE 2, LEFT (C50.912)  Story: left breast on 02/18/2020 - UIQ, 11 o'clock - IDC, ER - 95%, PR - 95%, Ki67 - 75%, Her2Neu - positive, positive axillary lymph node  Oncology - Lindi Adie and [place]  Plan:  1. MRI of breasts  2. Neoadjuvant chemotherapy for breast cancer - to see Dr. Lindi Adie next week   3. Power port placement   4. Question about right cervical lymph node - this was biopsied on 03/03/2020 and was benign  5. Genetics consultation  2.  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 Denman George adn Dr. Kennon Rounds  saw Dr. Margaretmary Bayley at [facility]   Dr. Sondra Come plans simultaneous treatment of the vulvar ca with rad tx 3. History of bipolar disease and alcohol dependence Sees Dr. Dossie Der Arfeen 4. Smokes 5. HTN x 1 year 6. History of kidney stones - seen at [facility] 7. GERD - history of esophageal stricture  Has been seen by Dr. Carlean Purl 8. Obese - BMI - 35   Alphonsa Overall, MD, [facility] Office phone:  [phone]

## 2020-03-05 NOTE — Progress Notes (Signed)
Met with patient whom brought proof of income for [employer].  Patient approved for the one-time $1000 [employer] grant to assist with personal expenses while going through treatment. She has a copy of the approval letter as well as the expense sheet along with the Outpatient pharmacy information. Discussed in detail expenses and how they are covered. She received a gas card today from her grant.  Also gave her an application for [employer] if interested in applying for additional assistance with household bills.  She has my card for any additional financial questions or concerns.

## 2020-03-06 ENCOUNTER — Ambulatory Visit (HOSPITAL_BASED_OUTPATIENT_CLINIC_OR_DEPARTMENT_OTHER): Payer: No Typology Code available for payment source | Admitting: Anesthesiology

## 2020-03-06 ENCOUNTER — Encounter: Payer: Self-pay | Admitting: *Deleted

## 2020-03-06 ENCOUNTER — Other Ambulatory Visit: Payer: Self-pay

## 2020-03-06 ENCOUNTER — Ambulatory Visit (HOSPITAL_COMMUNITY): Payer: No Typology Code available for payment source

## 2020-03-06 ENCOUNTER — Encounter (HOSPITAL_BASED_OUTPATIENT_CLINIC_OR_DEPARTMENT_OTHER): Payer: Self-pay | Admitting: Surgery

## 2020-03-06 ENCOUNTER — Encounter (HOSPITAL_BASED_OUTPATIENT_CLINIC_OR_DEPARTMENT_OTHER)
Admission: RE | Disposition: A | Discharge status: Home / Self Care | Payer: Self-pay | Source: Home / Self Care | Attending: Surgery

## 2020-03-06 ENCOUNTER — Ambulatory Visit (HOSPITAL_BASED_OUTPATIENT_CLINIC_OR_DEPARTMENT_OTHER)
Admission: RE | Admit: 2020-03-06 | Discharge: 2020-03-06 | Disposition: A | Discharge status: Home / Self Care | Payer: No Typology Code available for payment source | Attending: Surgery | Admitting: Surgery

## 2020-03-06 DIAGNOSIS — Z95828 Presence of other vascular implants and grafts: Secondary | ICD-10-CM

## 2020-03-06 DIAGNOSIS — Z419 Encounter for procedure for purposes other than remedying health state, unspecified: Secondary | ICD-10-CM

## 2020-03-06 DIAGNOSIS — Z87442 Personal history of urinary calculi: Secondary | ICD-10-CM | POA: Insufficient documentation

## 2020-03-06 DIAGNOSIS — Z8249 Family history of ischemic heart disease and other diseases of the circulatory system: Secondary | ICD-10-CM | POA: Insufficient documentation

## 2020-03-06 DIAGNOSIS — Z6835 Body mass index (BMI) 35.0-35.9, adult: Secondary | ICD-10-CM | POA: Insufficient documentation

## 2020-03-06 DIAGNOSIS — F4310 Post-traumatic stress disorder, unspecified: Secondary | ICD-10-CM | POA: Insufficient documentation

## 2020-03-06 DIAGNOSIS — C50212 Malignant neoplasm of upper-inner quadrant of left female breast: Secondary | ICD-10-CM | POA: Diagnosis not present

## 2020-03-06 DIAGNOSIS — E7800 Pure hypercholesterolemia, unspecified: Secondary | ICD-10-CM | POA: Insufficient documentation

## 2020-03-06 DIAGNOSIS — F411 Generalized anxiety disorder: Secondary | ICD-10-CM | POA: Insufficient documentation

## 2020-03-06 DIAGNOSIS — G2581 Restless legs syndrome: Secondary | ICD-10-CM | POA: Insufficient documentation

## 2020-03-06 DIAGNOSIS — K219 Gastro-esophageal reflux disease without esophagitis: Secondary | ICD-10-CM | POA: Insufficient documentation

## 2020-03-06 DIAGNOSIS — F319 Bipolar disorder, unspecified: Secondary | ICD-10-CM | POA: Insufficient documentation

## 2020-03-06 DIAGNOSIS — F17200 Nicotine dependence, unspecified, uncomplicated: Secondary | ICD-10-CM | POA: Insufficient documentation

## 2020-03-06 DIAGNOSIS — Z79899 Other long term (current) drug therapy: Secondary | ICD-10-CM | POA: Insufficient documentation

## 2020-03-06 DIAGNOSIS — I10 Essential (primary) hypertension: Secondary | ICD-10-CM | POA: Insufficient documentation

## 2020-03-06 DIAGNOSIS — C519 Malignant neoplasm of vulva, unspecified: Secondary | ICD-10-CM | POA: Insufficient documentation

## 2020-03-06 DIAGNOSIS — E669 Obesity, unspecified: Secondary | ICD-10-CM | POA: Insufficient documentation

## 2020-03-06 HISTORY — PX: PORTACATH PLACEMENT: SHX2246

## 2020-03-06 SURGERY — INSERTION, TUNNELED CENTRAL VENOUS DEVICE, WITH PORT
Anesthesia: General | Site: Chest | Laterality: Right

## 2020-03-06 MED ORDER — BUPIVACAINE HCL (PF) 0.25 % IJ SOLN
INTRAMUSCULAR | Status: AC
  Filled 2020-03-06: qty 30

## 2020-03-06 MED ORDER — PROPOFOL 10 MG/ML IV BOLUS
INTRAVENOUS | Status: DC | PRN
  Administered 2020-03-06: 200 mg via INTRAVENOUS

## 2020-03-06 MED ORDER — OXYCODONE HCL 5 MG PO TABS: 5.0000 mg | ORAL_TABLET | Freq: Once | ORAL | Status: DC | PRN

## 2020-03-06 MED ORDER — CEFAZOLIN SODIUM-DEXTROSE 2-4 GM/100ML-% IV SOLN
2.0000 g | INTRAVENOUS | Status: AC
  Administered 2020-03-06: 2 g via INTRAVENOUS

## 2020-03-06 MED ORDER — ONDANSETRON HCL 4 MG/2ML IJ SOLN
INTRAMUSCULAR | Status: AC
  Filled 2020-03-06: qty 2

## 2020-03-06 MED ORDER — BUPIVACAINE HCL (PF) 0.25 % IJ SOLN
INTRAMUSCULAR | Status: DC | PRN
  Administered 2020-03-06: 16 mL

## 2020-03-06 MED ORDER — SUCCINYLCHOLINE CHLORIDE 200 MG/10ML IV SOSY
PREFILLED_SYRINGE | INTRAVENOUS | Status: AC
  Filled 2020-03-06: qty 10

## 2020-03-06 MED ORDER — ACETAMINOPHEN 500 MG PO TABS
1000.0000 mg | ORAL_TABLET | ORAL | Status: AC
  Administered 2020-03-06: 1000 mg via ORAL

## 2020-03-06 MED ORDER — PROMETHAZINE HCL 25 MG/ML IJ SOLN
6.2500 mg | Freq: Once | INTRAMUSCULAR | Status: AC
  Administered 2020-03-06: 6.25 mg via INTRAVENOUS

## 2020-03-06 MED ORDER — FENTANYL CITRATE (PF) 100 MCG/2ML IJ SOLN: 25.0000 ug | INTRAMUSCULAR | Status: DC | PRN

## 2020-03-06 MED ORDER — FENTANYL CITRATE (PF) 100 MCG/2ML IJ SOLN
INTRAMUSCULAR | Status: DC | PRN
  Administered 2020-03-06 (×4): 50 ug via INTRAVENOUS

## 2020-03-06 MED ORDER — LIDOCAINE 2% (20 MG/ML) 5 ML SYRINGE
INTRAMUSCULAR | Status: DC | PRN
  Administered 2020-03-06: 100 mg via INTRAVENOUS

## 2020-03-06 MED ORDER — HEPARIN SOD (PORK) LOCK FLUSH 100 UNIT/ML IV SOLN
INTRAVENOUS | Status: AC
  Filled 2020-03-06: qty 5

## 2020-03-06 MED ORDER — FENTANYL CITRATE (PF) 100 MCG/2ML IJ SOLN
INTRAMUSCULAR | Status: AC
  Filled 2020-03-06: qty 2

## 2020-03-06 MED ORDER — ACETAMINOPHEN 500 MG PO TABS
ORAL_TABLET | ORAL | Status: AC
  Filled 2020-03-06: qty 2

## 2020-03-06 MED ORDER — AMISULPRIDE (ANTIEMETIC) 5 MG/2ML IV SOLN
10.0000 mg | Freq: Once | INTRAVENOUS | Status: AC
  Administered 2020-03-06: 10 mg via INTRAVENOUS

## 2020-03-06 MED ORDER — BUPIVACAINE-EPINEPHRINE 0.25% -1:200000 IJ SOLN: INTRAMUSCULAR | Status: DC | PRN

## 2020-03-06 MED ORDER — EPHEDRINE 5 MG/ML INJ
INTRAVENOUS | Status: AC
  Filled 2020-03-06: qty 10

## 2020-03-06 MED ORDER — HEPARIN (PORCINE) IN NACL 2-0.9 UNITS/ML
INTRAMUSCULAR | Status: AC | PRN
  Administered 2020-03-06: 1

## 2020-03-06 MED ORDER — OXYCODONE HCL 5 MG/5ML PO SOLN: 5.0000 mg | Freq: Once | ORAL | Status: DC | PRN

## 2020-03-06 MED ORDER — ONDANSETRON HCL 4 MG/2ML IJ SOLN
INTRAMUSCULAR | Status: DC | PRN
  Administered 2020-03-06: 4 mg via INTRAVENOUS

## 2020-03-06 MED ORDER — MIDAZOLAM HCL 5 MG/5ML IJ SOLN
INTRAMUSCULAR | Status: DC | PRN
  Administered 2020-03-06: 2 mg via INTRAVENOUS

## 2020-03-06 MED ORDER — HYDROCODONE-ACETAMINOPHEN 5-325 MG PO TABS: 1.0000 | ORAL_TABLET | Freq: Four times a day (QID) | ORAL | 0 refills | Status: DC | PRN

## 2020-03-06 MED ORDER — AMISULPRIDE (ANTIEMETIC) 5 MG/2ML IV SOLN
INTRAVENOUS | Status: AC
  Filled 2020-03-06: qty 4

## 2020-03-06 MED ORDER — HEPARIN SOD (PORK) LOCK FLUSH 100 UNIT/ML IV SOLN
INTRAVENOUS | Status: DC | PRN
  Administered 2020-03-06: 400 [IU]

## 2020-03-06 MED ORDER — HEPARIN (PORCINE) IN NACL 1000-0.9 UT/500ML-% IV SOLN
INTRAVENOUS | Status: AC
  Filled 2020-03-06: qty 500

## 2020-03-06 MED ORDER — DEXAMETHASONE SODIUM PHOSPHATE 10 MG/ML IJ SOLN
INTRAMUSCULAR | Status: DC | PRN
  Administered 2020-03-06: 10 mg via INTRAVENOUS

## 2020-03-06 MED ORDER — CHLORHEXIDINE GLUCONATE 4 % EX LIQD: 60.0000 mL | Freq: Once | CUTANEOUS | Status: DC

## 2020-03-06 MED ORDER — LIDOCAINE 2% (20 MG/ML) 5 ML SYRINGE
INTRAMUSCULAR | Status: AC
  Filled 2020-03-06: qty 5

## 2020-03-06 MED ORDER — PROMETHAZINE HCL 25 MG/ML IJ SOLN
INTRAMUSCULAR | Status: AC
  Filled 2020-03-06: qty 1

## 2020-03-06 MED ORDER — LACTATED RINGERS IV SOLN: INTRAVENOUS | Status: DC

## 2020-03-06 MED ORDER — BUPIVACAINE HCL (PF) 0.5 % IJ SOLN
INTRAMUSCULAR | Status: AC
  Filled 2020-03-06: qty 30

## 2020-03-06 MED ORDER — MIDAZOLAM HCL 2 MG/2ML IJ SOLN
INTRAMUSCULAR | Status: AC
  Filled 2020-03-06: qty 2

## 2020-03-06 MED ORDER — CEFAZOLIN SODIUM-DEXTROSE 2-4 GM/100ML-% IV SOLN
INTRAVENOUS | Status: AC
  Filled 2020-03-06: qty 100

## 2020-03-06 MED FILL — HYDROCODON-APAP 5-325: 5-325 | 3 days supply | Qty: 12 | Fill #0

## 2020-03-06 SURGICAL SUPPLY — 56 items
ADH SKN CLS APL DERMABOND .7 (GAUZE/BANDAGES/DRESSINGS) ×1
APL PRP STRL LF DISP 70% ISPRP (MISCELLANEOUS) ×1
APL SKNCLS STERI-STRIP NONHPOA (GAUZE/BANDAGES/DRESSINGS)
BAG DECANTER FOR FLEXI CONT (MISCELLANEOUS) ×2 IMPLANT
BENZOIN TINCTURE PRP APPL 2/3 (GAUZE/BANDAGES/DRESSINGS) ×1 IMPLANT
BLADE SURG 15 STRL LF DISP TIS (BLADE) ×1 IMPLANT
BLADE SURG 15 STRL SS (BLADE) ×2
CHLORAPREP W/TINT 26 (MISCELLANEOUS) ×2 IMPLANT
CLEANER CAUTERY TIP 5X5 PAD (MISCELLANEOUS) ×1 IMPLANT
COVER BACK TABLE 60X90IN (DRAPES) ×2 IMPLANT
COVER MAYO STAND STRL (DRAPES) ×2 IMPLANT
COVER PROBE 5X48 (MISCELLANEOUS) ×2
COVER WAND RF STERILE (DRAPES) IMPLANT
DECANTER SPIKE VIAL GLASS SM (MISCELLANEOUS) IMPLANT
DERMABOND ADVANCED (GAUZE/BANDAGES/DRESSINGS) ×1
DERMABOND ADVANCED .7 DNX12 (GAUZE/BANDAGES/DRESSINGS) ×1 IMPLANT
DRAPE C-ARM 42X72 X-RAY (DRAPES) ×2 IMPLANT
DRAPE LAPAROSCOPIC ABDOMINAL (DRAPES) ×2 IMPLANT
DRAPE UTILITY XL STRL (DRAPES) ×2 IMPLANT
ELECT REM PT RETURN 9FT ADLT (ELECTROSURGICAL) ×2
ELECTRODE REM PT RTRN 9FT ADLT (ELECTROSURGICAL) ×1 IMPLANT
GAUZE SPONGE 4X4 12PLY STRL (GAUZE/BANDAGES/DRESSINGS) IMPLANT
GAUZE SPONGE 4X4 12PLY STRL LF (GAUZE/BANDAGES/DRESSINGS) ×2 IMPLANT
GLOVE BIOGEL PI IND STRL 7.0 (GLOVE) IMPLANT
GLOVE BIOGEL PI INDICATOR 7.0 (GLOVE) ×2
GLOVE SURG SS PI 7.0 STRL IVOR (GLOVE) ×1 IMPLANT
GLOVE SURG SYN 7.5  E (GLOVE) ×2
GLOVE SURG SYN 7.5 E (GLOVE) ×1 IMPLANT
GLOVE SURG SYN 7.5 PF PI (GLOVE) ×1 IMPLANT
GOWN STRL REUS W/ TWL LRG LVL3 (GOWN DISPOSABLE) ×1 IMPLANT
GOWN STRL REUS W/ TWL XL LVL3 (GOWN DISPOSABLE) ×1 IMPLANT
GOWN STRL REUS W/TWL LRG LVL3 (GOWN DISPOSABLE) ×2
GOWN STRL REUS W/TWL XL LVL3 (GOWN DISPOSABLE) ×2
IV CATH AUTO 14GX1.75 SAFE ORG (IV SOLUTION) IMPLANT
IV CATH PLACEMENT UNIT 16 GA (IV SOLUTION) IMPLANT
IV CONNECTOR ONE LINK NDLESS (IV SETS) ×1 IMPLANT
IV KIT MINILOC 20X1 SAFETY (NEEDLE) IMPLANT
KIT CVR 48X5XPRB PLUP LF (MISCELLANEOUS) IMPLANT
KIT PORT POWER 8FR ISP CVUE (Port) ×1 IMPLANT
NDL HYPO 25X1 1.5 SAFETY (NEEDLE) ×1 IMPLANT
NEEDLE HYPO 25X1 1.5 SAFETY (NEEDLE) ×2 IMPLANT
PACK BASIN DAY SURGERY FS (CUSTOM PROCEDURE TRAY) ×2 IMPLANT
PAD CLEANER CAUTERY TIP 5X5 (MISCELLANEOUS) ×1
PENCIL SMOKE EVACUATOR (MISCELLANEOUS) ×2 IMPLANT
SET SHEATH INTRODUCER 10FR (MISCELLANEOUS) IMPLANT
SHEATH COOK PEEL AWAY SET 9F (SHEATH) IMPLANT
SLEEVE SCD COMPRESS KNEE MED (MISCELLANEOUS) ×2 IMPLANT
SPONGE LAP 4X18 RFD (DISPOSABLE) ×1 IMPLANT
STRIP CLOSURE SKIN 1/4X4 (GAUZE/BANDAGES/DRESSINGS) ×2 IMPLANT
SUT ETHILON 2 0 FS 18 (SUTURE) IMPLANT
SUT ETHILON 3 0 PS 1 (SUTURE) IMPLANT
SUT MNCRL AB 4-0 PS2 18 (SUTURE) ×2 IMPLANT
SUT VICRYL 3-0 CR8 SH (SUTURE) ×2 IMPLANT
SYR 5ML LUER SLIP (SYRINGE) ×2 IMPLANT
SYR CONTROL 10ML LL (SYRINGE) ×2 IMPLANT
TOWEL GREEN STERILE FF (TOWEL DISPOSABLE) ×2 IMPLANT

## 2020-03-06 NOTE — Anesthesia Procedure Notes (Signed)
Procedure Name: LMA Insertion Date/Time: 03/06/2020 3:03 PM Performed by: Genelle Bal, CRNA Pre-anesthesia Checklist: Patient identified, Emergency Drugs available, Suction available and Patient being monitored Patient Re-evaluated:Patient Re-evaluated prior to induction Oxygen Delivery Method: Circle system utilized Preoxygenation: Pre-oxygenation with 100% oxygen Induction Type: IV induction Ventilation: Mask ventilation without difficulty LMA: LMA inserted LMA Size: 4.0 Number of attempts: 1 Airway Equipment and Method: Bite block Placement Confirmation: positive ETCO2 Tube secured with: Tape Dental Injury: Teeth and Oropharynx as per pre-operative assessment

## 2020-03-06 NOTE — Transfer of Care (Signed)
Immediate Anesthesia Transfer of Care Note  Patient: Sandra Miller  Procedure(s) Performed: INSERTION PORT-A-CATH WITH ULTRASOUND GUIDANCE (Right Chest)  Patient Location: PACU  Anesthesia Type:General  Level of Consciousness: awake, alert  and oriented  Airway & Oxygen Therapy: Patient Spontanous Breathing and Patient connected to face mask oxygen  Post-op Assessment: Report given to RN and Post -op Vital signs reviewed and stable  Post vital signs: Reviewed and stable  Last Vitals:  Vitals Value Taken Time  BP 137/64 03/06/20 1604  Temp    Pulse 86 03/06/20 1605  Resp 21 03/06/20 1605  SpO2 97 % 03/06/20 1605  Vitals shown include unvalidated device data.  Last Pain:  Vitals:   03/06/20 1150  TempSrc: Oral  PainSc: 0-No pain         Complications: No complications documented.

## 2020-03-06 NOTE — Anesthesia Preprocedure Evaluation (Signed)
Anesthesia Evaluation  Patient identified by MRN, date of birth, ID band Patient awake    Reviewed: Allergy & Precautions, NPO status , Patient's Chart, lab work & pertinent test results  Airway Mallampati: II  TM Distance: >3 FB Neck ROM: Full    Dental no notable dental hx.    Pulmonary neg pulmonary ROS, Current Smoker,    Pulmonary exam normal breath sounds clear to auscultation       Cardiovascular hypertension, Normal cardiovascular exam Rhythm:Regular Rate:Normal     Neuro/Psych Anxiety Bipolar Disorder negative neurological ROS     GI/Hepatic Neg liver ROS, hiatal hernia, GERD  Medicated,  Endo/Other  negative endocrine ROS  Renal/GU negative Renal ROS  negative genitourinary   Musculoskeletal negative musculoskeletal ROS (+)   Abdominal   Peds negative pediatric ROS (+)  Hematology negative hematology ROS (+)   Anesthesia Other Findings   Reproductive/Obstetrics negative OB ROS                             Anesthesia Physical Anesthesia Plan  ASA: II  Anesthesia Plan: General   Post-op Pain Management:    Induction: Intravenous  PONV Risk Score and Plan: 2 and Ondansetron, Dexamethasone and Treatment may vary due to age or medical condition  Airway Management Planned: LMA  Additional Equipment:   Intra-op Plan:   Post-operative Plan: Extubation in OR  Informed Consent: I have reviewed the patients History and Physical, chart, labs and discussed the procedure including the risks, benefits and alternatives for the proposed anesthesia with the patient or authorized representative who has indicated his/her understanding and acceptance.     Dental advisory given  Plan Discussed with: CRNA and Surgeon  Anesthesia Plan Comments:         Anesthesia Quick Evaluation

## 2020-03-06 NOTE — Discharge Instructions (Signed)
[facility] - DISCHARGE INSTRUCTIONS TO PATIENT  Activity:  Driving - May drive in a day or two, if doing well and off pain meds                        Practice your Covid-19 protection:  Wear a mask, social distance, and wash your hands frequently  Wound Care:   Leave the incision dry for 2 days, then you may shower  Diet:  As tolerated  Follow up appointment:  Call Dr. Pollie Friar office [facility]) at [phone] for an appointment in 2 months..  Medications and dosages:  Resume your home medications.  You have a prescription for:  vicodin             You may also take Tylenol, ibuprofen, or Aleve for pain  Call Dr. [name] or his office  [phone]) if you have:  Temperature greater than 100.4,  Persistent nausea and vomiting,  Severe uncontrolled pain,  Redness, tenderness, or signs of infection (pain, swelling, redness, odor or green/yellow discharge around the site),  Difficulty breathing, headache or visual disturbances,  Any other questions or concerns you may have after discharge.  In an emergency, call 911 or go to an Emergency Department at a nearby hospital.      No Tylenol until 6:15 PM on 03/06/2020       Post Anesthesia Home Care Instructions  Activity: Get plenty of rest for the remainder of the day. A responsible individual must stay with you for 24 hours following the procedure.  For the next 24 hours, DO NOT: -Drive a car -Paediatric nurse -Drink alcoholic beverages -Take any medication unless instructed by your physician -Make any legal decisions or sign important papers.  Meals: Start with liquid foods such as gelatin or soup. Progress to regular foods as tolerated. Avoid greasy, spicy, heavy foods. If nausea and/or vomiting occur, drink only clear liquids until the nausea and/or vomiting subsides. Call your physician if vomiting continues.  Special Instructions/Symptoms: Your throat may feel dry or sore from  the anesthesia or the breathing tube placed in your throat during surgery. If this causes discomfort, gargle with warm salt water. The discomfort should disappear within 24 hours.  If you had a scopolamine patch placed behind your ear for the management of post- operative nausea and/or vomiting:  1. The medication in the patch is effective for 72 hours, after which it should be removed.  Wrap patch in a tissue and discard in the trash. Wash hands thoroughly with soap and water. 2. You may remove the patch earlier than 72 hours if you experience unpleasant side effects which may include dry mouth, dizziness or visual disturbances. 3. Avoid touching the patch. Wash your hands with soap and water after contact with the patch.

## 2020-03-06 NOTE — Op Note (Signed)
03/06/2020  3:57 PM  PATIENT:  Sandra Miller, 44 y.o., female MRN: [id number] DOB: December 15, 1975  PREOP DIAGNOSIS:  LEFT BREAST CANCER, anticipate chemotherapy  POSTOP DIAGNOSIS:   Left breast cancer, anticipate chemotherapy  PROCEDURE:   Procedure(s):  INSERTION Power Port in right internal jugular vein WITH ULTRASOUND GUIDANCE  SURGEON:   Alphonsa Overall, M.D.  ANESTHESIA:   general  Anesthesiologist: Myrtie Soman, MD  General  EBL:  minimal  ml  COUNTS CORRECT:  YES  INDICATIONS FOR PROCEDURE:  Sandra Miller is a 44 y.o. (DOB: 05-19-1976) AA female whose primary care physician is Rutherford Guys, MD and comes for power port placement for the treatment of left breast cancer.  Dr. Lindi Adie is her treating oncologist.  She also has a vulvar cancer which is going to get treatment by Dr. Sondra Come.   The indications and risks of the surgery were explained to the patient.  The risks include, but are not limited to, infection, bleeding, pneumothorax, nerve injury, and thrombosis of the vein.  OPERATIVE NOTE:  The patient was taken to OR room #5 at [facility].  Anesthesia was provided by Anesthesiologist: Myrtie Soman, MD.  At the beginning of the operation, the patient was given 2 gm Ancef, had a roll placed under her back, and had the upper chest/neck prepped with Chloroprep and draped.   A time out was held and the surgery checklist reviewed.   The patient was placed in Trendelenburg position.  The right internal jugular vein was accessed with a 16 gauge needle using an ultrasound and a guide wire threaded through the needle into the vein.  The position of the wire was checked with fluoroscopy.   I then developed a pocket in the upper inner aspect of the right chest for the port reservoir.  I used the [employer] for venous access.  The reservoir was sewn in place with a 3-0 Vicryl suture.  The reservoir had been flushed with dilute (10 units/cc) heparin.   I then passed the  silastic tubing from the reservoir incision to the subclavian incision to the right neck incision.  I used the 8 [place] introducer to pass the catheter into the vein.  The tip of the silastic catheter was position at the junction of the SVC and the right atrium under fluoroscopy.  The silastic catheter was then attached to the port with the bayonet device.     The entire port and tubing were checked with fluoroscopy and then the port was flushed with 4 cc of concentrated heparin (100 units/cc).  I injected 15 cc of 1/4% marcaine in the wounds.   The wounds were then closed with 3-0 vicryl subcutaneous sutures and the skin closed with a 4-0 Monocryl suture.  The skin was painted with DermaBond.   The patient was transferred to the recovery room in good condition.  The sponge and needle count were correct at the end of the case.  A CXR is ordered for port placement and pending at the time of this note.  Alphonsa Overall, MD, [facility] Office phone:  [phone]

## 2020-03-06 NOTE — Interval H&P Note (Signed)
History and Physical Interval Note:  03/06/2020 2:32 PM  Sandra Miller  has presented today for surgery, with the diagnosis of LEFT BREAST CANCER.  The various methods of treatment have been discussed with the patient and family.    Sister, Perley Jain, is with patient.  After consideration of risks, benefits and other options for treatment, the patient has consented to  Procedure(s): INSERTION PORT-A-CATH WITH ULTRASOUND GUIDANCE (N/A) as a surgical intervention.  The patient's history has been reviewed, patient examined, no change in status, stable for surgery.  I have reviewed the patient's chart and labs.  Questions were answered to the patient's satisfaction.     Shann Medal

## 2020-03-07 NOTE — Anesthesia Postprocedure Evaluation (Signed)
Anesthesia Post Note  Patient: Conservator, museum/gallery  Procedure(s) Performed: INSERTION PORT-A-CATH WITH ULTRASOUND GUIDANCE (Right Chest)     Patient location during evaluation: PACU Anesthesia Type: General Level of consciousness: awake and alert Pain management: pain level controlled Vital Signs Assessment: post-procedure vital signs reviewed and stable Respiratory status: spontaneous breathing, nonlabored ventilation, respiratory function stable and patient connected to nasal cannula oxygen Cardiovascular status: blood pressure returned to baseline and stable Postop Assessment: no apparent nausea or vomiting Anesthetic complications: no   No complications documented.  Last Vitals:  Vitals:   03/06/20 1700 03/06/20 1730  BP: (!) 152/84   Pulse: 100   Resp: 16   Temp: (!) 36.2 C   SpO2: 95% 98%    Last Pain:  Vitals:   03/06/20 1730  TempSrc:   PainSc: 0-No pain                 Asier Desroches S

## 2020-03-10 ENCOUNTER — Ambulatory Visit
Admission: RE | Admit: 2020-03-10 | Discharge: 2020-03-10 | Disposition: A | Discharge status: Home / Self Care | Payer: No Typology Code available for payment source | Source: Ambulatory Visit | Attending: Surgery | Admitting: Surgery

## 2020-03-10 ENCOUNTER — Other Ambulatory Visit: Payer: Self-pay | Admitting: Radiology

## 2020-03-10 ENCOUNTER — Encounter (HOSPITAL_BASED_OUTPATIENT_CLINIC_OR_DEPARTMENT_OTHER): Payer: Self-pay | Admitting: Surgery

## 2020-03-10 ENCOUNTER — Other Ambulatory Visit: Payer: Self-pay

## 2020-03-10 DIAGNOSIS — R9389 Abnormal findings on diagnostic imaging of other specified body structures: Secondary | ICD-10-CM

## 2020-03-10 MED ORDER — GADOBUTROL 1 MMOL/ML IV SOLN
10.0000 mL | Freq: Once | INTRAVENOUS | Status: AC | PRN
  Administered 2020-03-10: 10 mL via INTRAVENOUS

## 2020-03-10 NOTE — Assessment & Plan Note (Signed)
Breast cancer and vulvar cancers  02/20/2020:PET scan on 02/07/20 following a diagnosis of vulvar cancer showed a 1.7cm left breast mass and mildly hypermetabolic left axillary lymph nodes. Mammogram and [place] on 02/18/20 showed a 3.1cm left breast mass at the 11 o'clock position, palpable on exam, and a single abnormal left axillary lymph node with cortical thickening. Left breast biopsy on 02/20/20 showed invasive and in situ carcinoma in the breast and axilla, grade 3, HER-2 positive (3+), ER/PR+ 95%, Ki67 75%   Treatment plan: 1.  Neoadjuvant chemoradiation for vulvar cancer with Taxol, carboplatin, Herceptin weekly x6 cycles 2. neoadjuvant chemotherapy with TCH Perjeta x3 cycles 3.  Evaluation for breast conserving surgery if possible with sentinel lymph node study 3. Followed by adjuvant radiation therapy if patient had lumpectomy ------------------------------------------------------------------------------------------------------------------------------- Current treatment: Cycle 1 Taxol carboplatin Herceptin Labs reviewed Chemo education completed, chemo consent obtained Echocardiogram: 02/23/2020: EF 55 to 60%  Return to clinic in 1 week for follow-up

## 2020-03-10 NOTE — Progress Notes (Signed)
Patient Care Team: Myles Lipps, MD as PCP - General (Family Medicine) Pershing Proud, RN as Oncology Nurse Navigator [name], RN as Oncology Nurse Navigator Serena Croissant, MD as Consulting Physician (Hematology and Oncology) Ovidio Kin, MD as Consulting Physician (General Surgery) Paulina Fusi Servando Snare, RN as Oncology Nurse Navigator (Oncology)  DIAGNOSIS:    ICD-10-CM   1. Malignant neoplasm of upper-inner quadrant of left breast in female, estrogen receptor positive (HCC)  C50.212    Z17.0     SUMMARY OF ONCOLOGIC HISTORY: Oncology History  Vulvar cancer (HCC)  01/29/2020 Initial Diagnosis   Vulvar cancer (HCC)   03/04/2020 -  Chemotherapy   The patient had dexamethasone (DECADRON) 4 MG tablet, 4 mg (100 % of original dose 4 mg), Oral, Daily, 1 of 1 cycle, Start date: 02/27/2020, End date: -- Dose modification: 4 mg (original dose 4 mg, Cycle 0) palonosetron (ALOXI) injection 0.25 mg, 0.25 mg, Intravenous,  Once, 0 of 1 cycle CARBOplatin (PARAPLATIN) in sodium chloride 0.9 % 100 mL chemo infusion,  (original dose ), Intravenous,  Once, 0 of 1 cycle Dose modification:   (Cycle 1) PACLitaxel (TAXOL) 108 mg in sodium chloride 0.9 % 250 mL chemo infusion (</= 80mg /m2), 50 mg/m2 = 108 mg, Intravenous,  Once, 0 of 1 cycle trastuzumab-anns (KANJINTI) 378 mg in sodium chloride 0.9 % 250 mL chemo infusion, 4 mg/kg = 378 mg (original dose ), Intravenous,  Once, 0 of 1 cycle Dose modification: 4 mg/kg (Cycle 1, Reason: Other (see comments), Comment: loading dose)  for chemotherapy treatment.    Malignant neoplasm of upper-inner quadrant of left breast in female, estrogen receptor positive (HCC)  02/20/2020 Initial Diagnosis   PET scan on 02/07/20 following a diagnosis of vulvar cancer showed a 1.7cm left breast mass and mildly hypermetabolic left axillary lymph nodes. Mammogram and 04/08/20 on 02/18/20 showed a 3.1cm left breast mass at the 11 o'clock position, palpable on exam, and a single  abnormal left axillary lymph node with cortical thickening. Left breast biopsy on 02/20/20 showed invasive and in situ carcinoma in the breast and axilla, grade 3, HER-2 positive (3+), ER/PR+ 95%, Ki67 75%   02/25/2020 Cancer Staging   Staging form: Breast, AJCC 8th Edition - Clinical stage from 02/25/2020: Stage IB (cT2, cN1, cM0, G3, ER+, PR+, HER2+) - Signed by 02/27/2020, MD on 02/25/2020     CHIEF COMPLIANT: Follow-up of left breast cancer and vulvar cancer  INTERVAL HISTORY: Sandra Miller is a 44 y.o. with above-mentioned history of left breast cancer and vulvar cancer. Breast MRI on 02/27/20 showed a lateral left breast lesion at the 2:30 position, the primary mass at the 9:00 position in the medial left breast, and a right breast non-mass enhancement spanning 5.7cm. Left breast biopsy showed microinvasive carcinoma, grade 1, HER-2 positive (3+), ER+ 75%, PR- 0%, Ki67 40%. Echo on 03/02/20 showed an ejection fraction of 55-60%. Her port was placed by Dr. 03/04/20 on 03/06/20. She presents to the clinic today for follow-up.   ALLERGIES:  is allergic to depakote [divalproex sodium], minocycline, and aspirin.  MEDICATIONS:  Current Outpatient Medications  Medication Sig Dispense Refill  . acetaminophen (TYLENOL) 500 MG tablet Take 1,000 mg by mouth every 6 (six) hours as needed for moderate pain or fever.    05/07/20 dexamethasone (DECADRON) 4 MG tablet Take 1 tablet (4 mg total) by mouth daily. Start the day after chemotherapy for 2 days. 30 tablet 0  . fluconazole (DIFLUCAN) 150 MG tablet  Take 1 tablet (150 mg total) by mouth daily. Take 2nd tablet 72 hours later 2 tablet 0  . gabapentin (NEURONTIN) 300 MG capsule Take 1 capsule (300 mg total) by mouth daily. 90 capsule 3  . HYDROcodone-acetaminophen (NORCO/VICODIN) 5-325 MG tablet Take 1 tablet by mouth every 6 (six) hours as needed for moderate pain. 12 tablet 0  . lidocaine-prilocaine (EMLA) cream Apply to affected area once 30 g 3  .  lisinopril-hydrochlorothiazide (ZESTORETIC) 20-12.5 MG tablet Take 1 tablet by mouth daily. 90 tablet 1  . LORazepam (ATIVAN) 0.5 MG tablet Take 1 tablet (0.5 mg total) by mouth at bedtime as needed for sleep. 30 tablet 0  . LORazepam (ATIVAN) 1 MG tablet Take 1 tablet (1 mg total) by mouth 3 (three) times daily as needed for anxiety. 90 tablet 0  . Multiple Vitamin (MULTIVITAMIN) tablet Take 1 tablet by mouth daily.    . nicotine (NICODERM CQ - DOSED IN MG/24 HOURS) 21 mg/24hr patch Place 1 patch (21 mg total) onto the skin daily. 28 patch 0  . OLANZapine (ZYPREXA) 15 MG tablet Take 1 tablet (15 mg total) by mouth at bedtime. 90 tablet 0  . omeprazole (PRILOSEC) 40 MG capsule TAKE 1 CAPSULE(40 MG) BY MOUTH TWICE DAILY BEFORE A MEAL 180 capsule 0  . ondansetron (ZOFRAN) 8 MG tablet Take 1 tablet (8 mg total) by mouth 2 (two) times daily as needed for refractory nausea / vomiting. Start on day 3 after chemo. 30 tablet 1  . Probiotic Product (PROBIOTIC DAILY PO) Take by mouth.    . prochlorperazine (COMPAZINE) 10 MG tablet Take 1 tablet (10 mg total) by mouth every 6 (six) hours as needed (Nausea or vomiting). 30 tablet 1  . topiramate (TOPAMAX) 25 MG tablet Take 1 tablet (25 mg total) by mouth at bedtime. 90 tablet 0  . traZODone (DESYREL) 100 MG tablet Take 1 tablet (100 mg total) by mouth at bedtime. 90 tablet 0  . venlafaxine XR (EFFEXOR XR) 37.5 MG 24 hr capsule Take three capsule (112.5 mg total) by mouth daily. 90 capsule 0   No current facility-administered medications for this visit.    PHYSICAL EXAMINATION: ECOG PERFORMANCE STATUS: 1 - Symptomatic but completely ambulatory  There were no vitals filed for this visit. There were no vitals filed for this visit.  LABORATORY DATA:  I have reviewed the data as listed CMP Latest Ref Rng & Units 03/03/2020 10/09/2018 12/27/2017  Glucose 70 - 99 mg/dL 101(H) 81 99  BUN 6 - 20 mg/dL '13 12 10  '$ Creatinine 0.44 - 1.00 mg/dL 0.75 0.72 0.93  Sodium  135 - 145 mmol/L 138 141 137  Potassium 3.5 - 5.1 mmol/L 3.4(L) 4.1 4.0  Chloride 98 - 111 mmol/L 106 103 103  CO2 22 - 32 mmol/L 20(L) 22 25  Calcium 8.9 - 10.3 mg/dL 9.6 10.1 9.5  Total Protein 6.0 - 8.5 g/dL - 7.0 7.3  Total Bilirubin 0.0 - 1.2 mg/dL - 0.2 1.0  Alkaline Phos 39 - 117 IU/L - 70 56  AST 0 - 40 IU/L - 17 27  ALT 0 - 32 IU/L - 13 12(L)    Lab Results  Component Value Date   WBC 11.9 (H) 03/11/2020   HGB 13.0 03/11/2020   HCT 38.1 03/11/2020   MCV 86.0 03/11/2020   PLT 366 03/11/2020   NEUTROABS 7.7 03/11/2020    ASSESSMENT & PLAN:  Malignant neoplasm of upper-inner quadrant of left breast in female, estrogen receptor positive (  [place]) Breast cancer and vulvar cancers  02/20/2020:PET scan on 02/07/20 following a diagnosis of vulvar cancer showed a 1.7cm left breast mass and mildly hypermetabolic left axillary lymph nodes. Mammogram and [place] on 02/18/20 showed a 3.1cm left breast mass at the 11 o'clock position, palpable on exam, and a single abnormal left axillary lymph node with cortical thickening. Left breast biopsy on 02/20/20 showed invasive and in situ carcinoma in the breast and axilla, grade 3, HER-2 positive (3+), ER/PR+ 95%, Ki67 75%   Treatment plan: 1.  Neoadjuvant chemoradiation for vulvar cancer with Taxol, carboplatin, Herceptin weekly x6 cycles 2. neoadjuvant chemotherapy with TCH Perjeta x3 cycles 3.  Evaluation for breast conserving surgery if possible with sentinel lymph node study 3. Followed by adjuvant radiation therapy if patient had lumpectomy ------------------------------------------------------------------------------------------------------------------------------- Current treatment: Cycle 1 Taxol carboplatin Herceptin, weekly Labs reviewed Chemo education completed, chemo consent obtained Echocardiogram: 02/23/2020: EF 55 to 60% Patient will start radiation next week Return to clinic in 1 week for follow-up  No orders of the defined types were  placed in this encounter.  The patient has a good understanding of the overall plan. she agrees with it. she will call with any problems that may develop before the next visit here.  Total time spent: 30 mins including face to face time and time spent for planning, charting and coordination of care  Nicholas Lose, MD 03/11/2020  I, Cloyde Reams Dorshimer, am acting as scribe for Dr. Nicholas Lose.  I have reviewed the above documentation for accuracy and completeness, and I agree with the above.

## 2020-03-11 ENCOUNTER — Inpatient Hospital Stay (HOSPITAL_BASED_OUTPATIENT_CLINIC_OR_DEPARTMENT_OTHER): Payer: No Typology Code available for payment source | Admitting: Hematology and Oncology

## 2020-03-11 ENCOUNTER — Other Ambulatory Visit: Payer: Self-pay

## 2020-03-11 ENCOUNTER — Ambulatory Visit: Payer: No Typology Code available for payment source

## 2020-03-11 ENCOUNTER — Inpatient Hospital Stay: Payer: No Typology Code available for payment source | Attending: Gynecologic Oncology

## 2020-03-11 ENCOUNTER — Other Ambulatory Visit: Payer: Self-pay | Admitting: Hematology and Oncology

## 2020-03-11 ENCOUNTER — Encounter: Payer: Self-pay | Admitting: *Deleted

## 2020-03-11 ENCOUNTER — Encounter: Payer: Self-pay | Admitting: Licensed Clinical Social Worker

## 2020-03-11 ENCOUNTER — Inpatient Hospital Stay: Payer: No Typology Code available for payment source

## 2020-03-11 ENCOUNTER — Institutional Professional Consult (permissible substitution): Payer: No Typology Code available for payment source | Admitting: Radiation Oncology

## 2020-03-11 ENCOUNTER — Other Ambulatory Visit: Payer: Self-pay | Admitting: *Deleted

## 2020-03-11 VITALS — BP 143/78 | HR 74 | Temp 98.9°F | Resp 20

## 2020-03-11 DIAGNOSIS — Z5112 Encounter for antineoplastic immunotherapy: Secondary | ICD-10-CM | POA: Insufficient documentation

## 2020-03-11 DIAGNOSIS — C519 Malignant neoplasm of vulva, unspecified: Secondary | ICD-10-CM | POA: Insufficient documentation

## 2020-03-11 DIAGNOSIS — Z5111 Encounter for antineoplastic chemotherapy: Secondary | ICD-10-CM | POA: Diagnosis not present

## 2020-03-11 DIAGNOSIS — C50212 Malignant neoplasm of upper-inner quadrant of left female breast: Secondary | ICD-10-CM

## 2020-03-11 DIAGNOSIS — Z170 Estrogen receptor positive status [ER+]: Secondary | ICD-10-CM

## 2020-03-11 DIAGNOSIS — Z510 Encounter for antineoplastic radiation therapy: Secondary | ICD-10-CM | POA: Diagnosis not present

## 2020-03-11 DIAGNOSIS — Z95828 Presence of other vascular implants and grafts: Secondary | ICD-10-CM | POA: Insufficient documentation

## 2020-03-11 LAB — CBC WITH DIFFERENTIAL (CANCER CENTER ONLY)
Abs Immature Granulocytes: 0.03 10*3/uL (ref 0.00–0.07)
Basophils Absolute: 0 10*3/uL (ref 0.0–0.1)
Basophils Relative: 0 %
Eosinophils Absolute: 0 10*3/uL (ref 0.0–0.5)
Eosinophils Relative: 0 %
HCT: 38.1 % (ref 36.0–46.0)
Hemoglobin: 13 g/dL (ref 12.0–15.0)
Immature Granulocytes: 0 %
Lymphocytes Relative: 31 %
Lymphs Abs: 3.6 10*3/uL (ref 0.7–4.0)
MCH: 29.3 pg (ref 26.0–34.0)
MCHC: 34.1 g/dL (ref 30.0–36.0)
MCV: 86 fL (ref 80.0–100.0)
Monocytes Absolute: 0.5 10*3/uL (ref 0.1–1.0)
Monocytes Relative: 5 %
Neutro Abs: 7.7 10*3/uL (ref 1.7–7.7)
Neutrophils Relative %: 64 %
Platelet Count: 366 10*3/uL (ref 150–400)
RBC: 4.43 MIL/uL (ref 3.87–5.11)
RDW: 14.1 % (ref 11.5–15.5)
WBC Count: 11.9 10*3/uL — ABNORMAL HIGH (ref 4.0–10.5)
nRBC: 0 % (ref 0.0–0.2)

## 2020-03-11 LAB — CMP (CANCER CENTER ONLY)
ALT: 28 U/L (ref 0–44)
AST: 23 U/L (ref 15–41)
Albumin: 4.1 g/dL (ref 3.5–5.0)
Alkaline Phosphatase: 55 U/L (ref 38–126)
Anion gap: 13 (ref 5–15)
BUN: 8 mg/dL (ref 6–20)
CO2: 24 mmol/L (ref 22–32)
Calcium: 9.9 mg/dL (ref 8.9–10.3)
Chloride: 106 mmol/L (ref 98–111)
Creatinine: 0.85 mg/dL (ref 0.44–1.00)
GFR, Est AFR Am: >60 mL/min (ref >60)
GFR, Estimated: >60 mL/min (ref >60)
Glucose, Bld: 130 mg/dL — ABNORMAL HIGH (ref 70–99)
Potassium: 2.7 mmol/L — CL (ref 3.5–5.1)
Sodium: 143 mmol/L (ref 135–145)
Total Bilirubin: 0.5 mg/dL (ref 0.3–1.2)
Total Protein: 7.5 g/dL (ref 6.5–8.1)

## 2020-03-11 MED ORDER — SODIUM CHLORIDE 0.9% FLUSH
10.0000 mL | Freq: Once | INTRAVENOUS | Status: AC
  Administered 2020-03-11: 10 mL
  Filled 2020-03-11: qty 10

## 2020-03-11 MED ORDER — POTASSIUM CHLORIDE CRYS ER 20 MEQ PO TBCR
40.0000 meq | EXTENDED_RELEASE_TABLET | Freq: Once | ORAL | Status: AC
  Administered 2020-03-11: 40 meq via ORAL

## 2020-03-11 MED ORDER — PALONOSETRON HCL INJECTION 0.25 MG/5ML
INTRAVENOUS | Status: AC
  Filled 2020-03-11: qty 5

## 2020-03-11 MED ORDER — DIPHENHYDRAMINE HCL 50 MG/ML IJ SOLN
INTRAMUSCULAR | Status: AC
  Filled 2020-03-11: qty 1

## 2020-03-11 MED ORDER — ACETAMINOPHEN 325 MG PO TABS
ORAL_TABLET | ORAL | Status: AC
  Filled 2020-03-11: qty 2

## 2020-03-11 MED ORDER — TRASTUZUMAB-ANNS CHEMO 150 MG IV SOLR
4.0000 mg/kg | Freq: Once | INTRAVENOUS | Status: AC
  Administered 2020-03-11: 378 mg via INTRAVENOUS
  Filled 2020-03-11: qty 18

## 2020-03-11 MED ORDER — SODIUM CHLORIDE 0.9 % IV SOLN
300.0000 mg | Freq: Once | INTRAVENOUS | Status: AC
  Administered 2020-03-11: 300 mg via INTRAVENOUS
  Filled 2020-03-11: qty 30

## 2020-03-11 MED ORDER — DIPHENHYDRAMINE HCL 50 MG/ML IJ SOLN
25.0000 mg | Freq: Once | INTRAMUSCULAR | Status: AC
  Administered 2020-03-11: 25 mg via INTRAVENOUS

## 2020-03-11 MED ORDER — SODIUM CHLORIDE 0.9 % IV SOLN
10.0000 mg | Freq: Once | INTRAVENOUS | Status: AC
  Administered 2020-03-11: 10 mg via INTRAVENOUS
  Filled 2020-03-11: qty 10

## 2020-03-11 MED ORDER — PALONOSETRON HCL INJECTION 0.25 MG/5ML
0.2500 mg | Freq: Once | INTRAVENOUS | Status: AC
  Administered 2020-03-11: 0.25 mg via INTRAVENOUS

## 2020-03-11 MED ORDER — FAMOTIDINE IN NACL 20-0.9 MG/50ML-% IV SOLN
INTRAVENOUS | Status: AC
  Filled 2020-03-11: qty 50

## 2020-03-11 MED ORDER — ACETAMINOPHEN 325 MG PO TABS
650.0000 mg | ORAL_TABLET | Freq: Once | ORAL | Status: AC
  Administered 2020-03-11: 650 mg via ORAL

## 2020-03-11 MED ORDER — SODIUM CHLORIDE 0.9 % IV SOLN
50.0000 mg/m2 | Freq: Once | INTRAVENOUS | Status: AC
  Administered 2020-03-11: 108 mg via INTRAVENOUS
  Filled 2020-03-11: qty 18

## 2020-03-11 MED ORDER — POTASSIUM CHLORIDE CRYS ER 20 MEQ PO TBCR
EXTENDED_RELEASE_TABLET | ORAL | Status: AC
  Filled 2020-03-11: qty 2

## 2020-03-11 MED ORDER — POTASSIUM CHLORIDE CRYS ER 20 MEQ PO TBCR
20.0000 meq | EXTENDED_RELEASE_TABLET | Freq: Every day | ORAL | 0 refills | Status: DC
Start: 2020-03-11 — End: 2020-04-14

## 2020-03-11 MED ORDER — SODIUM CHLORIDE 0.9 % IV SOLN
Freq: Once | INTRAVENOUS | Status: AC
  Filled 2020-03-11: qty 250

## 2020-03-11 MED ORDER — HEPARIN SOD (PORK) LOCK FLUSH 100 UNIT/ML IV SOLN
500.0000 [IU] | Freq: Once | INTRAVENOUS | Status: AC | PRN
  Administered 2020-03-11: 500 [IU]
  Filled 2020-03-11: qty 5

## 2020-03-11 MED ORDER — FAMOTIDINE IN NACL 20-0.9 MG/50ML-% IV SOLN
20.0000 mg | Freq: Once | INTRAVENOUS | Status: AC
  Administered 2020-03-11: 20 mg via INTRAVENOUS

## 2020-03-11 MED ORDER — SODIUM CHLORIDE 0.9% FLUSH
10.0000 mL | INTRAVENOUS | Status: DC | PRN
  Administered 2020-03-11: 10 mL
  Filled 2020-03-11: qty 10

## 2020-03-11 MED FILL — POTASSIUM CHLORIDE CRYS ER: 20 | 30 days supply | Qty: 30 | Fill #0

## 2020-03-11 NOTE — Progress Notes (Signed)
Per MD okay to treat with potassium of 2.7.  Pt to receive 40 mEq p.o potassium during treatment today and will start taking 20 mEq potassium p.o daily at home.  Prescription sent to pharmacy on file and pt notified.

## 2020-03-11 NOTE — Progress Notes (Signed)
[place] CSW Progress Note  Holiday representative met with patient in infusion to provide support during first chemo treatment.  Patient reports being anxious today as it is her first treatment and she does not know exactly what to expect in terms of how she will feel. She is falling asleep at night, but waking up early (around 2/3am) and not falling back to sleep for a couple of hours due to anxiety. She believes it will reduce some after treatment today.  Patient did meet with [facility] yesterday to complete application for disability. She will work on the other applications for assistance as she has time.    Edwinna Areola Jami Bogdanski , LCSW

## 2020-03-11 NOTE — Patient Instructions (Signed)
[facility] Discharge Instructions for Patients Receiving Chemotherapy  Today you received the following chemotherapy agents Trastuzumab, Paclitaxel (Taxol), Carboplatin  To help prevent nausea and vomiting after your treatment, we encourage you to take your nausea medication as directed.   If you develop nausea and vomiting that is not controlled by your nausea medication, call the clinic.   BELOW ARE SYMPTOMS THAT SHOULD BE REPORTED IMMEDIATELY:  *FEVER GREATER THAN 100.5 F  *CHILLS WITH OR WITHOUT FEVER  NAUSEA AND VOMITING THAT IS NOT CONTROLLED WITH YOUR NAUSEA MEDICATION  *UNUSUAL SHORTNESS OF BREATH  *UNUSUAL BRUISING OR BLEEDING  TENDERNESS IN MOUTH AND THROAT WITH OR WITHOUT PRESENCE OF ULCERS  *URINARY PROBLEMS  *BOWEL PROBLEMS  UNUSUAL RASH Items with * indicate a potential emergency and should be followed up as soon as possible.  Feel free to call the clinic should you have any questions or concerns. The clinic phone number is [phone].  Please show the [facility] at check-in to the Emergency Department and triage nurse.  Trastuzumab injection for infusion What is this medicine? TRASTUZUMAB (tras TOO zoo mab) is a monoclonal antibody. It is used to treat breast cancer and stomach cancer. This medicine may be used for other purposes; ask your health care provider or pharmacist if you have questions. COMMON BRAND NAME(S): Herceptin, Galvin Proffer, Trazimera What should I tell my health care provider before I take this medicine? They need to know if you have any of these conditions:  heart disease  heart failure  lung or breathing disease, like asthma  an unusual or allergic reaction to trastuzumab, benzyl alcohol, or other medications, foods, dyes, or preservatives  pregnant or trying to get pregnant  breast-feeding How should I use this medicine? This drug is given as an infusion into a vein. It is  administered in a hospital or clinic by a specially trained health care professional. Talk to your pediatrician regarding the use of this medicine in children. This medicine is not approved for use in children. Overdosage: If you think you have taken too much of this medicine contact a poison control center or emergency room at once. NOTE: This medicine is only for you. Do not share this medicine with others. What if I miss a dose? It is important not to miss a dose. Call your doctor or health care professional if you are unable to keep an appointment. What may interact with this medicine? This medicine may interact with the following medications:  certain types of chemotherapy, such as daunorubicin, doxorubicin, epirubicin, and idarubicin This list may not describe all possible interactions. Give your health care provider a list of all the medicines, herbs, non-prescription drugs, or dietary supplements you use. Also tell them if you smoke, drink alcohol, or use illegal drugs. Some items may interact with your medicine. What should I watch for while using this medicine? Visit your doctor for checks on your progress. Report any side effects. Continue your course of treatment even though you feel ill unless your doctor tells you to stop. Call your doctor or health care professional for advice if you get a fever, chills or sore throat, or other symptoms of a cold or flu. Do not treat yourself. Try to avoid being around people who are sick. You may experience fever, chills and shaking during your first infusion. These effects are usually mild and can be treated with other medicines. Report any side effects during the infusion to your health care professional.  Fever and chills usually do not happen with later infusions. Do not become pregnant while taking this medicine or for 7 months after stopping it. Women should inform their doctor if they wish to become pregnant or think they might be pregnant. Women  of child-bearing potential will need to have a negative pregnancy test before starting this medicine. There is a potential for serious side effects to an unborn child. Talk to your health care professional or pharmacist for more information. Do not breast-feed an infant while taking this medicine or for 7 months after stopping it. Women must use effective birth control with this medicine. What side effects may I notice from receiving this medicine? Side effects that you should report to your doctor or health care professional as soon as possible:  allergic reactions like skin rash, itching or hives, swelling of the face, lips, or tongue  chest pain or palpitations  cough  dizziness  feeling faint or lightheaded, falls  fever  general ill feeling or flu-like symptoms  signs of worsening heart failure like breathing problems; swelling in your legs and feet  unusually weak or tired Side effects that usually do not require medical attention (report to your doctor or health care professional if they continue or are bothersome):  bone pain  changes in taste  diarrhea  joint pain  nausea/vomiting  weight loss This list may not describe all possible side effects. Call your doctor for medical advice about side effects. You may report side effects to FDA at 1-800-FDA-1088. Where should I keep my medicine? This drug is given in a hospital or clinic and will not be stored at home. NOTE: This sheet is a summary. It may not cover all possible information. If you have questions about this medicine, talk to your doctor, pharmacist, or health care provider.  2020 Elsevier/Gold Standard (2016-08-16 14:37:52)  Paclitaxel injection What is this medicine? PACLITAXEL (PAK li TAX el) is a chemotherapy drug. It targets fast dividing cells, like cancer cells, and causes these cells to die. This medicine is used to treat ovarian cancer, breast cancer, lung cancer, Kaposi's sarcoma, and other  cancers. This medicine may be used for other purposes; ask your health care provider or pharmacist if you have questions. COMMON BRAND NAME(S): Onxol, Taxol What should I tell my health care provider before I take this medicine? They need to know if you have any of these conditions:  history of irregular heartbeat  liver disease  low blood counts, like low white cell, platelet, or red cell counts  lung or breathing disease, like asthma  tingling of the fingers or toes, or other nerve disorder  an unusual or allergic reaction to paclitaxel, alcohol, polyoxyethylated castor oil, other chemotherapy, other medicines, foods, dyes, or preservatives  pregnant or trying to get pregnant  breast-feeding How should I use this medicine? This drug is given as an infusion into a vein. It is administered in a hospital or clinic by a specially trained health care professional. Talk to your pediatrician regarding the use of this medicine in children. Special care may be needed. Overdosage: If you think you have taken too much of this medicine contact a poison control center or emergency room at once. NOTE: This medicine is only for you. Do not share this medicine with others. What if I miss a dose? It is important not to miss your dose. Call your doctor or health care professional if you are unable to keep an appointment. What may interact with this  medicine? Do not take this medicine with any of the following medications:  disulfiram  metronidazole This medicine may also interact with the following medications:  antiviral medicines for hepatitis, HIV or AIDS  certain antibiotics like erythromycin and clarithromycin  certain medicines for fungal infections like ketoconazole and itraconazole  certain medicines for seizures like carbamazepine, phenobarbital, phenytoin  gemfibrozil  nefazodone  rifampin  St. John's wort This list may not describe all possible interactions. Give your  health care provider a list of all the medicines, herbs, non-prescription drugs, or dietary supplements you use. Also tell them if you smoke, drink alcohol, or use illegal drugs. Some items may interact with your medicine. What should I watch for while using this medicine? Your condition will be monitored carefully while you are receiving this medicine. You will need important blood work done while you are taking this medicine. This medicine can cause serious allergic reactions. To reduce your risk you will need to take other medicine(s) before treatment with this medicine. If you experience allergic reactions like skin rash, itching or hives, swelling of the face, lips, or tongue, tell your doctor or health care professional right away. In some cases, you may be given additional medicines to help with side effects. Follow all directions for their use. This drug may make you feel generally unwell. This is not uncommon, as chemotherapy can affect healthy cells as well as cancer cells. Report any side effects. Continue your course of treatment even though you feel ill unless your doctor tells you to stop. Call your doctor or health care professional for advice if you get a fever, chills or sore throat, or other symptoms of a cold or flu. Do not treat yourself. This drug decreases your body's ability to fight infections. Try to avoid being around people who are sick. This medicine may increase your risk to bruise or bleed. Call your doctor or health care professional if you notice any unusual bleeding. Be careful brushing and flossing your teeth or using a toothpick because you may get an infection or bleed more easily. If you have any dental work done, tell your dentist you are receiving this medicine. Avoid taking products that contain aspirin, acetaminophen, ibuprofen, naproxen, or ketoprofen unless instructed by your doctor. These medicines may hide a fever. Do not become pregnant while taking this  medicine. Women should inform their doctor if they wish to become pregnant or think they might be pregnant. There is a potential for serious side effects to an unborn child. Talk to your health care professional or pharmacist for more information. Do not breast-feed an infant while taking this medicine. Men are advised not to father a child while receiving this medicine. This product may contain alcohol. Ask your pharmacist or healthcare provider if this medicine contains alcohol. Be sure to tell all healthcare providers you are taking this medicine. Certain medicines, like metronidazole and disulfiram, can cause an unpleasant reaction when taken with alcohol. The reaction includes flushing, headache, nausea, vomiting, sweating, and increased thirst. The reaction can last from 30 minutes to several hours. What side effects may I notice from receiving this medicine? Side effects that you should report to your doctor or health care professional as soon as possible:  allergic reactions like skin rash, itching or hives, swelling of the face, lips, or tongue  breathing problems  changes in vision  fast, irregular heartbeat  high or low blood pressure  mouth sores  pain, tingling, numbness in the hands or feet  signs of decreased platelets or bleeding - bruising, pinpoint red spots on the skin, black, tarry stools, blood in the urine  signs of decreased red blood cells - unusually weak or tired, feeling faint or lightheaded, falls  signs of infection - fever or chills, cough, sore throat, pain or difficulty passing urine  signs and symptoms of liver injury like dark yellow or brown urine; general ill feeling or flu-like symptoms; light-colored stools; loss of appetite; nausea; right upper belly pain; unusually weak or tired; yellowing of the eyes or skin  swelling of the ankles, feet, hands  unusually slow heartbeat Side effects that usually do not require medical attention (report to your  doctor or health care professional if they continue or are bothersome):  diarrhea  hair loss  loss of appetite  muscle or joint pain  nausea, vomiting  pain, redness, or irritation at site where injected  tiredness This list may not describe all possible side effects. Call your doctor for medical advice about side effects. You may report side effects to FDA at 1-800-FDA-1088. Where should I keep my medicine? This drug is given in a hospital or clinic and will not be stored at home. NOTE: This sheet is a summary. It may not cover all possible information. If you have questions about this medicine, talk to your doctor, pharmacist, or health care provider.  2020 Elsevier/Gold Standard (2017-04-25 13:14:55)  Carboplatin injection What is this medicine? CARBOPLATIN (KAR boe pla tin) is a chemotherapy drug. It targets fast dividing cells, like cancer cells, and causes these cells to die. This medicine is used to treat ovarian cancer and many other cancers. This medicine may be used for other purposes; ask your health care provider or pharmacist if you have questions. COMMON BRAND NAME(S): Paraplatin What should I tell my health care provider before I take this medicine? They need to know if you have any of these conditions:  blood disorders  hearing problems  kidney disease  recent or ongoing radiation therapy  an unusual or allergic reaction to carboplatin, cisplatin, other chemotherapy, other medicines, foods, dyes, or preservatives  pregnant or trying to get pregnant  breast-feeding How should I use this medicine? This drug is usually given as an infusion into a vein. It is administered in a hospital or clinic by a specially trained health care professional. Talk to your pediatrician regarding the use of this medicine in children. Special care may be needed. Overdosage: If you think you have taken too much of this medicine contact a poison control center or emergency room  at once. NOTE: This medicine is only for you. Do not share this medicine with others. What if I miss a dose? It is important not to miss a dose. Call your doctor or health care professional if you are unable to keep an appointment. What may interact with this medicine?  medicines for seizures  medicines to increase blood counts like filgrastim, pegfilgrastim, sargramostim  some antibiotics like amikacin, gentamicin, neomycin, streptomycin, tobramycin  vaccines Talk to your doctor or health care professional before taking any of these medicines:  acetaminophen  aspirin  ibuprofen  ketoprofen  naproxen This list may not describe all possible interactions. Give your health care provider a list of all the medicines, herbs, non-prescription drugs, or dietary supplements you use. Also tell them if you smoke, drink alcohol, or use illegal drugs. Some items may interact with your medicine. What should I watch for while using this medicine? Your condition will be monitored  carefully while you are receiving this medicine. You will need important blood work done while you are taking this medicine. This drug may make you feel generally unwell. This is not uncommon, as chemotherapy can affect healthy cells as well as cancer cells. Report any side effects. Continue your course of treatment even though you feel ill unless your doctor tells you to stop. In some cases, you may be given additional medicines to help with side effects. Follow all directions for their use. Call your doctor or health care professional for advice if you get a fever, chills or sore throat, or other symptoms of a cold or flu. Do not treat yourself. This drug decreases your body's ability to fight infections. Try to avoid being around people who are sick. This medicine may increase your risk to bruise or bleed. Call your doctor or health care professional if you notice any unusual bleeding. Be careful brushing and flossing your  teeth or using a toothpick because you may get an infection or bleed more easily. If you have any dental work done, tell your dentist you are receiving this medicine. Avoid taking products that contain aspirin, acetaminophen, ibuprofen, naproxen, or ketoprofen unless instructed by your doctor. These medicines may hide a fever. Do not become pregnant while taking this medicine. Women should inform their doctor if they wish to become pregnant or think they might be pregnant. There is a potential for serious side effects to an unborn child. Talk to your health care professional or pharmacist for more information. Do not breast-feed an infant while taking this medicine. What side effects may I notice from receiving this medicine? Side effects that you should report to your doctor or health care professional as soon as possible:  allergic reactions like skin rash, itching or hives, swelling of the face, lips, or tongue  signs of infection - fever or chills, cough, sore throat, pain or difficulty passing urine  signs of decreased platelets or bleeding - bruising, pinpoint red spots on the skin, black, tarry stools, nosebleeds  signs of decreased red blood cells - unusually weak or tired, fainting spells, lightheadedness  breathing problems  changes in hearing  changes in vision  chest pain  high blood pressure  low blood counts - This drug may decrease the number of white blood cells, red blood cells and platelets. You may be at increased risk for infections and bleeding.  nausea and vomiting  pain, swelling, redness or irritation at the injection site  pain, tingling, numbness in the hands or feet  problems with balance, talking, walking  trouble passing urine or change in the amount of urine Side effects that usually do not require medical attention (report to your doctor or health care professional if they continue or are bothersome):  hair loss  loss of appetite  metallic taste  in the mouth or changes in taste This list may not describe all possible side effects. Call your doctor for medical advice about side effects. You may report side effects to FDA at 1-800-FDA-1088. Where should I keep my medicine? This drug is given in a hospital or clinic and will not be stored at home. NOTE: This sheet is a summary. It may not cover all possible information. If you have questions about this medicine, talk to your doctor, pharmacist, or health care provider.  2020 Elsevier/Gold Standard (2007-11-27 14:38:05)

## 2020-03-12 ENCOUNTER — Telehealth: Payer: Self-pay | Admitting: Hematology and Oncology

## 2020-03-12 ENCOUNTER — Telehealth: Payer: Self-pay | Admitting: *Deleted

## 2020-03-12 NOTE — Telephone Encounter (Signed)
No 7/7 los, no changes made to patient schedule

## 2020-03-14 ENCOUNTER — Ambulatory Visit: Payer: No Typology Code available for payment source | Admitting: Radiation Oncology

## 2020-03-15 ENCOUNTER — Ambulatory Visit: Payer: No Typology Code available for payment source | Admitting: Radiation Oncology

## 2020-03-16 ENCOUNTER — Ambulatory Visit
Admission: RE | Admit: 2020-03-16 | Discharge: 2020-03-16 | Disposition: A | Discharge status: Home / Self Care | Payer: No Typology Code available for payment source | Source: Ambulatory Visit | Attending: Radiation Oncology | Admitting: Radiation Oncology

## 2020-03-16 ENCOUNTER — Other Ambulatory Visit: Payer: No Typology Code available for payment source

## 2020-03-16 DIAGNOSIS — C519 Malignant neoplasm of vulva, unspecified: Secondary | ICD-10-CM

## 2020-03-16 DIAGNOSIS — Z510 Encounter for antineoplastic radiation therapy: Secondary | ICD-10-CM | POA: Diagnosis not present

## 2020-03-16 NOTE — Progress Notes (Signed)
°  Radiation Oncology         [phone]) [phone] ________________________________  Name: Sandra Miller MRN: [id number]  Date: 03/16/2020  DOB: 1976-08-24  Simulation Verification Note    ICD-10-CM   1. Vulvar cancer ([place])  C51.9     Status: outpatient   NARRATIVE: The patient was brought to the treatment unit and placed in the planned treatment position. The clinical setup was verified. Then port films were obtained and uploaded to the radiation oncology medical record software. The treatment beams were carefully compared against the planned radiation fields. The position location and shape of the radiation fields was reviewed. They targeted volume of tissue appears to be appropriately covered by the radiation beams. Organs at risk appear to be excluded as planned.  Based on my personal review, I approved the simulation verification. The patient's treatment will proceed as planned.   -----------------------------------  Blair Promise, PhD, MD  This document serves as a record of services personally performed by Gery Pray, MD. It was created on his behalf by Clerance Lav, a trained medical scribe. The creation of this record is based on the scribe's personal observations and the provider's statements to them. This document has been checked and approved by the attending provider.

## 2020-03-16 NOTE — Progress Notes (Signed)
Patient Care Team: Rutherford Guys, MD as PCP - General (Family Medicine) Mauro Kaufmann, RN as Oncology Nurse Navigator Rockwell Germany, RN as Oncology Nurse Navigator Nicholas Lose, MD as Consulting Physician (Hematology and Oncology) Alphonsa Overall, MD as Consulting Physician (General Surgery) Awanda Mink Craige Cotta, RN as Oncology Nurse Navigator (Oncology)  DIAGNOSIS:    ICD-10-CM   1. Malignant neoplasm of upper-inner quadrant of left breast in female, estrogen receptor positive ([place])  C50.212    Z17.0     SUMMARY OF ONCOLOGIC HISTORY: Oncology History  Vulvar cancer ([place])  01/29/2020 Initial Diagnosis   Vulvar cancer ([place])   03/11/2020 -  Chemotherapy   The patient had dexamethasone (DECADRON) 4 MG tablet, 4 mg (100 % of original dose 4 mg), Oral, Daily, 1 of 1 cycle, Start date: 02/27/2020, End date: -- Dose modification: 4 mg (original dose 4 mg, Cycle 0) palonosetron (ALOXI) injection 0.25 mg, 0.25 mg, Intravenous,  Once, 1 of 1 cycle Administration: 0.25 mg (03/11/2020) CARBOplatin (PARAPLATIN) 300 mg in sodium chloride 0.9 % 250 mL chemo infusion, 300 mg (100 % of original dose 300 mg), Intravenous,  Once, 1 of 1 cycle Dose modification:   (original dose 300 mg, Cycle 1) Administration: 300 mg (03/11/2020) PACLitaxel (TAXOL) 108 mg in sodium chloride 0.9 % 250 mL chemo infusion (</= 47m/m2), 50 mg/m2 = 108 mg, Intravenous,  Once, 1 of 1 cycle Administration: 108 mg (03/11/2020) trastuzumab-anns (KANJINTI) 378 mg in sodium chloride 0.9 % 250 mL chemo infusion, 4 mg/kg = 378 mg (100 % of original dose 4 mg/kg), Intravenous,  Once, 1 of 1 cycle Dose modification: 4 mg/kg (original dose 4 mg/kg, Cycle 1, Reason: Other (see comments), Comment: loading dose) Administration: 378 mg (03/11/2020)  for chemotherapy treatment.    Malignant neoplasm of upper-inner quadrant of left breast in [place], [place] positive [place]  02/20/2020 Initial Diagnosis   PET scan on 02/07/20 following a  diagnosis of vulvar cancer showed a 1.7cm left breast mass and mildly hypermetabolic left axillary lymph nodes. [place] and [place] showed a 3.1cm left breast mass at the 11 o'clock position, palpable on exam, and a single abnormal left axillary lymph node with cortical thickening. Left breast biopsy on 02/20/20 showed invasive and in situ carcinoma in the breast and axilla, grade 3, HER-2 positive (3+), ER/PR+ 95%, Ki67 75%   02/25/2020 Cancer Staging   Staging form: Breast, AJCC 8th Edition - Clinical stage from 02/25/2020: Stage IB (cT2, cN1, cM0, G3, ER+, PR+, HER2+) - Signed by GNicholas Lose MD on 02/25/2020     CHIEF COMPLIANT: Cycle 2 Taxol, Carboplatin, and Herceptin  INTERVAL HISTORY: Sandra Miller is a 44y.o. with above-mentioned history of left breast cancer and vulvar cancer currently on neoadjuvant chemotherapy treatment with weekly Taxol, Carboplatin, and Herceptin. She presents to the clinic today for treatment.   ALLERGIES:  is allergic to depakote [divalproex sodium], minocycline, and aspirin.  MEDICATIONS:  Current Outpatient Medications  Medication Sig Dispense Refill  . acetaminophen (TYLENOL) 500 MG tablet Take 1,000 mg by mouth every 6 (six) hours as needed for moderate pain or fever.    .Marland Kitchendexamethasone (DECADRON) 4 MG tablet Take 1 tablet (4 mg total) by mouth daily. Start the day after chemotherapy for 2 days. 30 tablet 0  . fluconazole (DIFLUCAN) 150 MG tablet Take 1 tablet (150 mg total) by mouth daily. Take 2nd tablet 72 hours later 2 tablet 0  . gabapentin (NEURONTIN) 300 MG capsule  Take 1 capsule (300 mg total) by mouth daily. 90 capsule 3  . HYDROcodone-acetaminophen (NORCO/VICODIN) 5-325 MG tablet Take 1 tablet by mouth every 6 (six) hours as needed for moderate pain. 12 tablet 0  . lidocaine-prilocaine (EMLA) cream Apply to affected area once 30 g 3  . lisinopril-hydrochlorothiazide (ZESTORETIC) 20-12.5 MG tablet Take 1 tablet by mouth daily. 90 tablet 1   . LORazepam (ATIVAN) 1 MG tablet Take 1 tablet (1 mg total) by mouth 3 (three) times daily as needed for anxiety. 90 tablet 0  . Multiple Vitamin (MULTIVITAMIN) tablet Take 1 tablet by mouth daily.    . nicotine (NICODERM CQ - DOSED IN MG/24 HOURS) 21 mg/24hr patch Place 1 patch (21 mg total) onto the skin daily. 28 patch 0  . OLANZapine (ZYPREXA) 15 MG tablet Take 1 tablet (15 mg total) by mouth at bedtime. 90 tablet 0  . omeprazole (PRILOSEC) 40 MG capsule TAKE 1 CAPSULE(40 MG) BY MOUTH TWICE DAILY BEFORE A MEAL 180 capsule 0  . ondansetron (ZOFRAN) 8 MG tablet Take 1 tablet (8 mg total) by mouth 2 (two) times daily as needed for refractory nausea / vomiting. Start on day 3 after chemo. 30 tablet 1  . potassium chloride SA (KLOR-CON) 20 MEQ tablet Take 1 tablet (20 mEq total) by mouth daily. 30 tablet 0  . Probiotic Product (PROBIOTIC DAILY PO) Take by mouth.    . prochlorperazine (COMPAZINE) 10 MG tablet Take 1 tablet (10 mg total) by mouth every 6 (six) hours as needed (Nausea or vomiting). 30 tablet 1  . topiramate (TOPAMAX) 25 MG tablet Take 1 tablet (25 mg total) by mouth at bedtime. 90 tablet 0  . traZODone (DESYREL) 100 MG tablet Take 1 tablet (100 mg total) by mouth at bedtime. 90 tablet 0  . venlafaxine XR (EFFEXOR XR) 37.5 MG 24 hr capsule Take three capsule (112.5 mg total) by mouth daily. 90 capsule 0   No current facility-administered medications for this visit.    PHYSICAL EXAMINATION: ECOG PERFORMANCE STATUS: 1 - Symptomatic but completely ambulatory  Vitals:   03/17/20 1034  BP: 125/79  Pulse: 98  Resp: 18  Temp: 98.5 F (36.9 C)  SpO2: 99%   Filed Weights   03/17/20 1034  Weight: 211 lb 4.8 oz (95.8 kg)    LABORATORY DATA:  I have reviewed the data as listed CMP Latest Ref Rng & Units 03/17/2020 03/11/2020 03/03/2020  Glucose 70 - 99 mg/dL 118(H) 130(H) 101(H)  BUN 6 - 20 mg/dL 11 8 13   Creatinine 0.44 - 1.00 mg/dL 0.90 0.85 0.75  Sodium 135 - 145 mmol/L 138  143 138  Potassium 3.5 - 5.1 mmol/L 3.5 2.7(LL) 3.4(L)  Chloride 98 - 111 mmol/L 106 106 106  CO2 22 - 32 mmol/L 22 24 20(L)  Calcium 8.9 - 10.3 mg/dL 10.1 9.9 9.6  Total Protein 6.5 - 8.1 g/dL 7.3 7.5 -  Total Bilirubin 0.3 - 1.2 mg/dL 0.7 0.5 -  Alkaline Phos 38 - 126 U/L 55 55 -  AST 15 - 41 U/L 53(H) 23 -  ALT 0 - 44 U/L 94(H) 28 -    Lab Results  Component Value Date   WBC 10.4 03/17/2020   HGB 12.4 03/17/2020   HCT 37.2 03/17/2020   MCV 86.5 03/17/2020   PLT 317 03/17/2020   NEUTROABS 6.8 03/17/2020    ASSESSMENT & PLAN:  Malignant neoplasm of upper-inner quadrant of left breast in female, estrogen receptor positive ([place]) Breast cancer and  vulvar cancers  02/20/2020:PET scan on 02/07/20 following a diagnosis of vulvar cancer showed a 1.7cm left breast mass and mildly hypermetabolic left axillary lymph nodes. Mammogram and [place] on 02/18/20 showed a 3.1cm left breast mass at the 11 o'clock position, palpable on exam, and a single abnormal left axillary lymph node with cortical thickening. Left breast biopsy on 02/20/20 showed invasive and in situ carcinoma in the breast and axilla, grade 3, HER-2 positive (3+), ER/PR+ 95%, Ki67 75%   Treatment plan: 1.  Neoadjuvant chemoradiation for vulvar cancer with Taxol, carboplatin, Herceptin weekly x6 cycles 2. neoadjuvant chemotherapy with TCH Perjeta x3 cycles 3.  Evaluation for breast conserving surgery if possible with sentinel lymph node study 3. Followed by adjuvant radiation therapy if patient had lumpectomy ------------------------------------------------------------------------------------------------------------------------------- Current treatment: Cycle 2 Taxol carboplatin Herceptin, weekly Labs reviewed Echocardiogram: 02/23/2020: EF 55 to 60% Chemo toxicities: Nausea that lasted for few hours. She started radiation yesterday. Return to clinic weekly for chemotherapy treatments.     No orders of the defined types were  placed in this encounter.  The patient has a good understanding of the overall plan. she agrees with it. she will call with any problems that may develop before the next visit here.  Total time spent: 30 mins including face to face time and time spent for planning, charting and coordination of care  Nicholas Lose, MD 03/17/2020  I, Cloyde Reams Dorshimer, am acting as scribe for Dr. Nicholas Lose.  I have reviewed the above documentation for accuracy and completeness, and I agree with the above.

## 2020-03-17 ENCOUNTER — Inpatient Hospital Stay: Payer: No Typology Code available for payment source

## 2020-03-17 ENCOUNTER — Encounter: Payer: Self-pay | Admitting: *Deleted

## 2020-03-17 ENCOUNTER — Encounter: Payer: Self-pay | Admitting: Licensed Clinical Social Worker

## 2020-03-17 ENCOUNTER — Other Ambulatory Visit: Payer: Self-pay

## 2020-03-17 ENCOUNTER — Ambulatory Visit
Admission: RE | Admit: 2020-03-17 | Discharge: 2020-03-17 | Disposition: A | Discharge status: Home / Self Care | Payer: No Typology Code available for payment source | Source: Ambulatory Visit | Attending: Radiation Oncology | Admitting: Radiation Oncology

## 2020-03-17 ENCOUNTER — Inpatient Hospital Stay (HOSPITAL_BASED_OUTPATIENT_CLINIC_OR_DEPARTMENT_OTHER): Payer: No Typology Code available for payment source | Admitting: Hematology and Oncology

## 2020-03-17 DIAGNOSIS — C519 Malignant neoplasm of vulva, unspecified: Secondary | ICD-10-CM

## 2020-03-17 DIAGNOSIS — Z5111 Encounter for antineoplastic chemotherapy: Secondary | ICD-10-CM | POA: Diagnosis not present

## 2020-03-17 DIAGNOSIS — C50212 Malignant neoplasm of upper-inner quadrant of left female breast: Secondary | ICD-10-CM

## 2020-03-17 DIAGNOSIS — Z510 Encounter for antineoplastic radiation therapy: Secondary | ICD-10-CM | POA: Diagnosis not present

## 2020-03-17 DIAGNOSIS — Z170 Estrogen receptor positive status [ER+]: Secondary | ICD-10-CM

## 2020-03-17 DIAGNOSIS — Z95828 Presence of other vascular implants and grafts: Secondary | ICD-10-CM

## 2020-03-17 LAB — CBC WITH DIFFERENTIAL (CANCER CENTER ONLY)
Abs Immature Granulocytes: 0.06 10*3/uL (ref 0.00–0.07)
Basophils Absolute: 0 10*3/uL (ref 0.0–0.1)
Basophils Relative: 0 %
Eosinophils Absolute: 0 10*3/uL (ref 0.0–0.5)
Eosinophils Relative: 0 %
HCT: 37.2 % (ref 36.0–46.0)
Hemoglobin: 12.4 g/dL (ref 12.0–15.0)
Immature Granulocytes: 1 %
Lymphocytes Relative: 30 %
Lymphs Abs: 3.2 10*3/uL (ref 0.7–4.0)
MCH: 28.8 pg (ref 26.0–34.0)
MCHC: 33.3 g/dL (ref 30.0–36.0)
MCV: 86.5 fL (ref 80.0–100.0)
Monocytes Absolute: 0.4 10*3/uL (ref 0.1–1.0)
Monocytes Relative: 4 %
Neutro Abs: 6.8 10*3/uL (ref 1.7–7.7)
Neutrophils Relative %: 65 %
Platelet Count: 317 10*3/uL (ref 150–400)
RBC: 4.3 MIL/uL (ref 3.87–5.11)
RDW: 14.2 % (ref 11.5–15.5)
WBC Count: 10.4 10*3/uL (ref 4.0–10.5)
nRBC: 0 % (ref 0.0–0.2)

## 2020-03-17 LAB — CMP (CANCER CENTER ONLY)
ALT: 94 U/L — ABNORMAL HIGH (ref 0–44)
AST: 53 U/L — ABNORMAL HIGH (ref 15–41)
Albumin: 4 g/dL (ref 3.5–5.0)
Alkaline Phosphatase: 55 U/L (ref 38–126)
Anion gap: 10 (ref 5–15)
BUN: 11 mg/dL (ref 6–20)
CO2: 22 mmol/L (ref 22–32)
Calcium: 10.1 mg/dL (ref 8.9–10.3)
Chloride: 106 mmol/L (ref 98–111)
Creatinine: 0.9 mg/dL (ref 0.44–1.00)
GFR, Est AFR Am: >60 mL/min (ref >60)
GFR, Estimated: >60 mL/min (ref >60)
Glucose, Bld: 118 mg/dL — ABNORMAL HIGH (ref 70–99)
Potassium: 3.5 mmol/L (ref 3.5–5.1)
Sodium: 138 mmol/L (ref 135–145)
Total Bilirubin: 0.7 mg/dL (ref 0.3–1.2)
Total Protein: 7.3 g/dL (ref 6.5–8.1)

## 2020-03-17 MED ORDER — SODIUM CHLORIDE 0.9 % IV SOLN
10.0000 mg | Freq: Once | INTRAVENOUS | Status: AC
  Administered 2020-03-17: 10 mg via INTRAVENOUS
  Filled 2020-03-17: qty 10

## 2020-03-17 MED ORDER — ACETAMINOPHEN 325 MG PO TABS
ORAL_TABLET | ORAL | Status: AC
  Filled 2020-03-17: qty 2

## 2020-03-17 MED ORDER — TRASTUZUMAB-ANNS CHEMO 150 MG IV SOLR
2.0000 mg/kg | Freq: Once | INTRAVENOUS | Status: AC
  Administered 2020-03-17: 189 mg via INTRAVENOUS
  Filled 2020-03-17: qty 9

## 2020-03-17 MED ORDER — DIPHENHYDRAMINE HCL 50 MG/ML IJ SOLN
INTRAMUSCULAR | Status: AC
  Filled 2020-03-17: qty 1

## 2020-03-17 MED ORDER — SODIUM CHLORIDE 0.9% FLUSH
10.0000 mL | Freq: Once | INTRAVENOUS | Status: AC
  Administered 2020-03-17: 10 mL
  Filled 2020-03-17: qty 10

## 2020-03-17 MED ORDER — SODIUM CHLORIDE 0.9 % IV SOLN
288.0000 mg | Freq: Once | INTRAVENOUS | Status: AC
  Administered 2020-03-17: 290 mg via INTRAVENOUS
  Filled 2020-03-17: qty 29

## 2020-03-17 MED ORDER — SODIUM CHLORIDE 0.9 % IV SOLN
50.0000 mg/m2 | Freq: Once | INTRAVENOUS | Status: AC
  Administered 2020-03-17: 108 mg via INTRAVENOUS
  Filled 2020-03-17: qty 18

## 2020-03-17 MED ORDER — FAMOTIDINE IN NACL 20-0.9 MG/50ML-% IV SOLN
INTRAVENOUS | Status: AC
  Filled 2020-03-17: qty 50

## 2020-03-17 MED ORDER — PALONOSETRON HCL INJECTION 0.25 MG/5ML
0.2500 mg | Freq: Once | INTRAVENOUS | Status: AC
  Administered 2020-03-17: 0.25 mg via INTRAVENOUS

## 2020-03-17 MED ORDER — HEPARIN SOD (PORK) LOCK FLUSH 100 UNIT/ML IV SOLN
500.0000 [IU] | Freq: Once | INTRAVENOUS | Status: AC | PRN
  Administered 2020-03-17: 500 [IU]
  Filled 2020-03-17: qty 5

## 2020-03-17 MED ORDER — DIPHENHYDRAMINE HCL 50 MG/ML IJ SOLN
25.0000 mg | Freq: Once | INTRAMUSCULAR | Status: AC
  Administered 2020-03-17: 25 mg via INTRAVENOUS

## 2020-03-17 MED ORDER — FAMOTIDINE IN NACL 20-0.9 MG/50ML-% IV SOLN
20.0000 mg | Freq: Once | INTRAVENOUS | Status: AC
  Administered 2020-03-17: 20 mg via INTRAVENOUS

## 2020-03-17 MED ORDER — SODIUM CHLORIDE 0.9 % IV SOLN
Freq: Once | INTRAVENOUS | Status: AC
  Filled 2020-03-17: qty 250

## 2020-03-17 MED ORDER — ACETAMINOPHEN 325 MG PO TABS
650.0000 mg | ORAL_TABLET | Freq: Once | ORAL | Status: AC
  Administered 2020-03-17: 650 mg via ORAL

## 2020-03-17 MED ORDER — SODIUM CHLORIDE 0.9% FLUSH
10.0000 mL | INTRAVENOUS | Status: DC | PRN
  Administered 2020-03-17: 10 mL
  Filled 2020-03-17: qty 10

## 2020-03-17 MED ORDER — PALONOSETRON HCL INJECTION 0.25 MG/5ML
INTRAVENOUS | Status: AC
  Filled 2020-03-17: qty 5

## 2020-03-17 NOTE — Assessment & Plan Note (Signed)
Breast cancer and vulvar cancers  02/20/2020:PET scan on 02/07/20 following a diagnosis of vulvar cancer showed a 1.7cm left breast mass and mildly hypermetabolic left axillary lymph nodes. Mammogram and [place] on 02/18/20 showed a 3.1cm left breast mass at the 11 o'clock position, palpable on exam, and a single abnormal left axillary lymph node with cortical thickening. Left breast biopsy on 02/20/20 showed invasive and in situ carcinoma in the breast and axilla, grade 3, HER-2 positive (3+), ER/PR+ 95%, Ki67 75%   Treatment plan: 1.  Neoadjuvant chemoradiation for vulvar cancer with Taxol, carboplatin, Herceptin weekly x6 cycles 2. neoadjuvant chemotherapy with TCH Perjeta x3 cycles 3.  Evaluation for breast conserving surgery if possible with sentinel lymph node study 3. Followed by adjuvant radiation therapy if patient had lumpectomy ------------------------------------------------------------------------------------------------------------------------------- Current treatment: Cycle 2 Taxol carboplatin Herceptin, weekly Labs reviewed Echocardiogram: 02/23/2020: EF 55 to 60% Chemo toxicities:  Return to clinic weekly for chemotherapy treatments.

## 2020-03-17 NOTE — Progress Notes (Signed)
Per Dr. Lindi Adie ok for treatment today with ALT 94.

## 2020-03-17 NOTE — Patient Instructions (Signed)
[facility] Discharge Instructions for Patients Receiving Chemotherapy  Today you received the following immuotherapy agent: Trastuzumab and chemotherapy agents: Paclitaxel (Taxol) and Carboplatin  To help prevent nausea and vomiting after your treatment, we encourage you to take your nausea medication as directed by your MD   If you develop nausea and vomiting that is not controlled by your nausea medication, call the clinic.   BELOW ARE SYMPTOMS THAT SHOULD BE REPORTED IMMEDIATELY:  *FEVER GREATER THAN 100.5 F  *CHILLS WITH OR WITHOUT FEVER  NAUSEA AND VOMITING THAT IS NOT CONTROLLED WITH YOUR NAUSEA MEDICATION  *UNUSUAL SHORTNESS OF BREATH  *UNUSUAL BRUISING OR BLEEDING  TENDERNESS IN MOUTH AND THROAT WITH OR WITHOUT PRESENCE OF ULCERS  *URINARY PROBLEMS  *BOWEL PROBLEMS  UNUSUAL RASH Items with * indicate a potential emergency and should be followed up as soon as possible.  Feel free to call the clinic should you have any questions or concerns. The clinic phone number is [phone].  Please show the [facility] at check-in to the Emergency Department and triage nurse.

## 2020-03-17 NOTE — Progress Notes (Signed)
[employer] CSW Progress Note  Holiday representative met with patient in infusion for ongoing support. Patient very tired due to medications, so declined conversation today. CSW did provide next [employer].    Edwinna Areola Byran Bilotti , LCSW

## 2020-03-17 NOTE — Progress Notes (Signed)
Pt here for patient teaching.  Pt given Radiation and You booklet and skin care instructions.  Reviewed areas of pertinence such as diarrhea, fatigue, hair loss, nausea and vomiting, skin changes and urinary and bladder changes . Pt able to give teach back of to pat skin, use unscented/gentle soap, have Imodium on hand and sitz bath,. Pt demonstrated understanding of information given and will contact nursing with any questions or concerns.     [email or web address]

## 2020-03-17 NOTE — Progress Notes (Signed)
Received disability paperwork from pt.  RN placed paperwork in appropriate location for FMLA/Disability representative to complete and return.

## 2020-03-18 ENCOUNTER — Other Ambulatory Visit: Payer: Self-pay

## 2020-03-18 ENCOUNTER — Ambulatory Visit
Admission: RE | Admit: 2020-03-18 | Discharge: 2020-03-18 | Disposition: A | Discharge status: Home / Self Care | Payer: No Typology Code available for payment source | Source: Ambulatory Visit | Attending: Radiation Oncology | Admitting: Radiation Oncology

## 2020-03-18 ENCOUNTER — Telehealth: Payer: Self-pay | Admitting: Hematology and Oncology

## 2020-03-18 DIAGNOSIS — Z510 Encounter for antineoplastic radiation therapy: Secondary | ICD-10-CM | POA: Diagnosis not present

## 2020-03-18 NOTE — Telephone Encounter (Signed)
No 7/13 los, no changes made to pt schedule 

## 2020-03-19 ENCOUNTER — Ambulatory Visit
Admission: RE | Admit: 2020-03-19 | Discharge: 2020-03-19 | Disposition: A | Discharge status: Home / Self Care | Payer: No Typology Code available for payment source | Source: Ambulatory Visit | Attending: Radiation Oncology | Admitting: Radiation Oncology

## 2020-03-19 ENCOUNTER — Other Ambulatory Visit: Payer: Self-pay

## 2020-03-19 DIAGNOSIS — Z510 Encounter for antineoplastic radiation therapy: Secondary | ICD-10-CM | POA: Diagnosis not present

## 2020-03-20 ENCOUNTER — Ambulatory Visit
Admission: RE | Admit: 2020-03-20 | Discharge: 2020-03-20 | Disposition: A | Discharge status: Home / Self Care | Payer: No Typology Code available for payment source | Source: Ambulatory Visit | Attending: Radiation Oncology | Admitting: Radiation Oncology

## 2020-03-20 ENCOUNTER — Other Ambulatory Visit: Payer: Self-pay

## 2020-03-20 DIAGNOSIS — Z510 Encounter for antineoplastic radiation therapy: Secondary | ICD-10-CM | POA: Diagnosis not present

## 2020-03-23 ENCOUNTER — Other Ambulatory Visit: Payer: Self-pay

## 2020-03-23 ENCOUNTER — Other Ambulatory Visit (HOSPITAL_COMMUNITY): Payer: Self-pay | Admitting: Psychiatry

## 2020-03-23 ENCOUNTER — Ambulatory Visit
Admission: RE | Admit: 2020-03-23 | Discharge: 2020-03-23 | Disposition: A | Discharge status: Home / Self Care | Payer: No Typology Code available for payment source | Source: Ambulatory Visit | Attending: Radiation Oncology | Admitting: Radiation Oncology

## 2020-03-23 ENCOUNTER — Other Ambulatory Visit (HOSPITAL_COMMUNITY): Payer: Self-pay | Admitting: *Deleted

## 2020-03-23 DIAGNOSIS — Z510 Encounter for antineoplastic radiation therapy: Secondary | ICD-10-CM | POA: Diagnosis not present

## 2020-03-23 DIAGNOSIS — F319 Bipolar disorder, unspecified: Secondary | ICD-10-CM

## 2020-03-23 MED ORDER — OLANZAPINE 15 MG PO TABS: 15.0000 mg | ORAL_TABLET | Freq: Every day | ORAL | 0 refills | Status: DC

## 2020-03-23 MED FILL — OLANZAPINE 15 MG TABS: 15 | 30 days supply | Qty: 30 | Fill #0

## 2020-03-24 ENCOUNTER — Inpatient Hospital Stay: Payer: No Typology Code available for payment source

## 2020-03-24 ENCOUNTER — Inpatient Hospital Stay (HOSPITAL_BASED_OUTPATIENT_CLINIC_OR_DEPARTMENT_OTHER): Payer: No Typology Code available for payment source | Admitting: Medical

## 2020-03-24 ENCOUNTER — Ambulatory Visit
Admission: RE | Admit: 2020-03-24 | Discharge: 2020-03-24 | Disposition: A | Discharge status: Home / Self Care | Payer: No Typology Code available for payment source | Source: Ambulatory Visit | Attending: Radiation Oncology | Admitting: Radiation Oncology

## 2020-03-24 ENCOUNTER — Other Ambulatory Visit: Payer: Self-pay | Admitting: Medical

## 2020-03-24 ENCOUNTER — Other Ambulatory Visit: Payer: Self-pay

## 2020-03-24 VITALS — BP 136/73 | HR 98 | Temp 98.1°F | Resp 18 | Wt 207.0 lb

## 2020-03-24 DIAGNOSIS — C519 Malignant neoplasm of vulva, unspecified: Secondary | ICD-10-CM

## 2020-03-24 DIAGNOSIS — R197 Diarrhea, unspecified: Secondary | ICD-10-CM

## 2020-03-24 DIAGNOSIS — Z510 Encounter for antineoplastic radiation therapy: Secondary | ICD-10-CM | POA: Diagnosis not present

## 2020-03-24 DIAGNOSIS — Z5111 Encounter for antineoplastic chemotherapy: Secondary | ICD-10-CM | POA: Diagnosis not present

## 2020-03-24 DIAGNOSIS — R110 Nausea: Secondary | ICD-10-CM

## 2020-03-24 DIAGNOSIS — Z95828 Presence of other vascular implants and grafts: Secondary | ICD-10-CM

## 2020-03-24 LAB — CMP (CANCER CENTER ONLY)
ALT: 66 U/L — ABNORMAL HIGH (ref 0–44)
AST: 37 U/L (ref 15–41)
Albumin: 4.1 g/dL (ref 3.5–5.0)
Alkaline Phosphatase: 53 U/L (ref 38–126)
Anion gap: 10 (ref 5–15)
BUN: 8 mg/dL (ref 6–20)
CO2: 20 mmol/L — ABNORMAL LOW (ref 22–32)
Calcium: 9.4 mg/dL (ref 8.9–10.3)
Chloride: 108 mmol/L (ref 98–111)
Creatinine: 0.76 mg/dL (ref 0.44–1.00)
GFR, Est AFR Am: >60 mL/min (ref >60)
GFR, Estimated: >60 mL/min (ref >60)
Glucose, Bld: 111 mg/dL — ABNORMAL HIGH (ref 70–99)
Potassium: 3.3 mmol/L — ABNORMAL LOW (ref 3.5–5.1)
Sodium: 138 mmol/L (ref 135–145)
Total Bilirubin: 0.6 mg/dL (ref 0.3–1.2)
Total Protein: 7.5 g/dL (ref 6.5–8.1)

## 2020-03-24 LAB — CBC WITH DIFFERENTIAL (CANCER CENTER ONLY)
Abs Immature Granulocytes: 0.05 10*3/uL (ref 0.00–0.07)
Basophils Absolute: 0 10*3/uL (ref 0.0–0.1)
Basophils Relative: 0 %
Eosinophils Absolute: 0 10*3/uL (ref 0.0–0.5)
Eosinophils Relative: 0 %
HCT: 36 % (ref 36.0–46.0)
Hemoglobin: 12.1 g/dL (ref 12.0–15.0)
Immature Granulocytes: 1 %
Lymphocytes Relative: 11 %
Lymphs Abs: 0.7 10*3/uL (ref 0.7–4.0)
MCH: 29.3 pg (ref 26.0–34.0)
MCHC: 33.6 g/dL (ref 30.0–36.0)
MCV: 87.2 fL (ref 80.0–100.0)
Monocytes Absolute: 0.3 10*3/uL (ref 0.1–1.0)
Monocytes Relative: 5 %
Neutro Abs: 5.3 10*3/uL (ref 1.7–7.7)
Neutrophils Relative %: 83 %
Platelet Count: 288 10*3/uL (ref 150–400)
RBC: 4.13 MIL/uL (ref 3.87–5.11)
RDW: 13.7 % (ref 11.5–15.5)
WBC Count: 6.4 10*3/uL (ref 4.0–10.5)
nRBC: 0 % (ref 0.0–0.2)

## 2020-03-24 MED ORDER — FAMOTIDINE IN NACL 20-0.9 MG/50ML-% IV SOLN
INTRAVENOUS | Status: AC
  Filled 2020-03-24: qty 50

## 2020-03-24 MED ORDER — SODIUM CHLORIDE 0.9 % IV SOLN
300.0000 mg | Freq: Once | INTRAVENOUS | Status: AC
  Administered 2020-03-24: 300 mg via INTRAVENOUS
  Filled 2020-03-24: qty 30

## 2020-03-24 MED ORDER — DIPHENHYDRAMINE HCL 50 MG/ML IJ SOLN
25.0000 mg | Freq: Once | INTRAMUSCULAR | Status: AC
  Administered 2020-03-24: 25 mg via INTRAVENOUS

## 2020-03-24 MED ORDER — SODIUM CHLORIDE 0.9 % IV SOLN
10.0000 mg | Freq: Once | INTRAVENOUS | Status: AC
  Administered 2020-03-24: 10 mg via INTRAVENOUS
  Filled 2020-03-24: qty 10

## 2020-03-24 MED ORDER — SODIUM CHLORIDE 0.9 % IV SOLN
50.0000 mg/m2 | Freq: Once | INTRAVENOUS | Status: AC
  Administered 2020-03-24: 108 mg via INTRAVENOUS
  Filled 2020-03-24: qty 18

## 2020-03-24 MED ORDER — SODIUM CHLORIDE 0.9% FLUSH
10.0000 mL | Freq: Once | INTRAVENOUS | Status: AC
  Administered 2020-03-24: 10 mL
  Filled 2020-03-24: qty 10

## 2020-03-24 MED ORDER — PALONOSETRON HCL INJECTION 0.25 MG/5ML
INTRAVENOUS | Status: AC
  Filled 2020-03-24: qty 5

## 2020-03-24 MED ORDER — ACETAMINOPHEN 325 MG PO TABS
ORAL_TABLET | ORAL | Status: AC
  Filled 2020-03-24: qty 2

## 2020-03-24 MED ORDER — SODIUM CHLORIDE 0.9 % IV SOLN
Freq: Once | INTRAVENOUS | Status: AC
  Filled 2020-03-24: qty 250

## 2020-03-24 MED ORDER — DIPHENOXYLATE-ATROPINE 2.5-0.025 MG PO TABS
ORAL_TABLET | ORAL | 1 refills | Status: DC
Start: 2020-03-24 — End: 2020-05-13

## 2020-03-24 MED ORDER — LORAZEPAM 2 MG/ML IJ SOLN
0.5000 mg | Freq: Once | INTRAMUSCULAR | Status: AC
  Administered 2020-03-24: 0.5 mg via INTRAVENOUS

## 2020-03-24 MED ORDER — DIPHENOXYLATE-ATROPINE 2.5-0.025 MG PO TABS
2.0000 | ORAL_TABLET | Freq: Once | ORAL | Status: AC
  Administered 2020-03-24: 2 via ORAL

## 2020-03-24 MED ORDER — FAMOTIDINE IN NACL 20-0.9 MG/50ML-% IV SOLN
20.0000 mg | Freq: Once | INTRAVENOUS | Status: AC
  Administered 2020-03-24: 20 mg via INTRAVENOUS

## 2020-03-24 MED ORDER — SODIUM CHLORIDE 0.9% FLUSH
10.0000 mL | INTRAVENOUS | Status: DC | PRN
  Administered 2020-03-24: 10 mL
  Filled 2020-03-24: qty 10

## 2020-03-24 MED ORDER — HEPARIN SOD (PORK) LOCK FLUSH 100 UNIT/ML IV SOLN
500.0000 [IU] | Freq: Once | INTRAVENOUS | Status: AC | PRN
  Administered 2020-03-24: 500 [IU]
  Filled 2020-03-24: qty 5

## 2020-03-24 MED ORDER — DIPHENHYDRAMINE HCL 50 MG/ML IJ SOLN
INTRAMUSCULAR | Status: AC
  Filled 2020-03-24: qty 1

## 2020-03-24 MED ORDER — LORAZEPAM 2 MG/ML IJ SOLN
INTRAMUSCULAR | Status: AC
  Filled 2020-03-24: qty 1

## 2020-03-24 MED ORDER — DIPHENOXYLATE-ATROPINE 2.5-0.025 MG PO TABS
ORAL_TABLET | ORAL | Status: AC
  Filled 2020-03-24: qty 2

## 2020-03-24 MED ORDER — ACETAMINOPHEN 325 MG PO TABS
650.0000 mg | ORAL_TABLET | Freq: Once | ORAL | Status: AC
  Administered 2020-03-24: 650 mg via ORAL

## 2020-03-24 MED ORDER — PALONOSETRON HCL INJECTION 0.25 MG/5ML
0.2500 mg | Freq: Once | INTRAVENOUS | Status: AC
  Administered 2020-03-24: 0.25 mg via INTRAVENOUS

## 2020-03-24 MED ORDER — SODIUM CHLORIDE 0.9 % IV SOLN
150.0000 mg | Freq: Once | INTRAVENOUS | Status: AC
  Administered 2020-03-24: 150 mg via INTRAVENOUS
  Filled 2020-03-24: qty 150

## 2020-03-24 MED ORDER — TRASTUZUMAB-ANNS CHEMO 150 MG IV SOLR
2.0000 mg/kg | Freq: Once | INTRAVENOUS | Status: AC
  Administered 2020-03-24: 189 mg via INTRAVENOUS
  Filled 2020-03-24: qty 9

## 2020-03-24 MED FILL — LISINOPRIL-HCTZ 20-12.5 MG: 20-12.5 | 90 days supply | Qty: 90 | Fill #1

## 2020-03-24 MED FILL — ONDANSETRON HCL 8 MG TABLET: 8 | 15 days supply | Qty: 30 | Fill #1

## 2020-03-24 MED FILL — DIPHENOXYLATE-ATROPINE 2.5-: 2.5-0.025 | 5 days supply | Qty: 45 | Fill #0

## 2020-03-24 NOTE — Progress Notes (Signed)
at

## 2020-03-24 NOTE — Patient Instructions (Signed)
[facility] Discharge Instructions for Patients Receiving Chemotherapy  Today you received the following immuotherapy agent: Trastuzumab and chemotherapy agents: Paclitaxel (Taxol) and Carboplatin  To help prevent nausea and vomiting after your treatment, we encourage you to take your nausea medication as directed by your MD   If you develop nausea and vomiting that is not controlled by your nausea medication, call the clinic.   BELOW ARE SYMPTOMS THAT SHOULD BE REPORTED IMMEDIATELY:  *FEVER GREATER THAN 100.5 F  *CHILLS WITH OR WITHOUT FEVER  NAUSEA AND VOMITING THAT IS NOT CONTROLLED WITH YOUR NAUSEA MEDICATION  *UNUSUAL SHORTNESS OF BREATH  *UNUSUAL BRUISING OR BLEEDING  TENDERNESS IN MOUTH AND THROAT WITH OR WITHOUT PRESENCE OF ULCERS  *URINARY PROBLEMS  *BOWEL PROBLEMS  UNUSUAL RASH Items with * indicate a potential emergency and should be followed up as soon as possible.  Feel free to call the clinic should you have any questions or concerns. The clinic phone number is [phone].  Please show the [facility] at check-in to the Emergency Department and triage nurse.

## 2020-03-24 NOTE — Patient Instructions (Signed)

## 2020-03-24 NOTE — Progress Notes (Signed)
Patient complained of nausea x 5 days. She stated her N/V medication wasn't helping. Notified [name], K and she stated that Sandra Miller would see her in the infusion area. Made patient aware.

## 2020-03-25 ENCOUNTER — Other Ambulatory Visit: Payer: Self-pay

## 2020-03-25 ENCOUNTER — Ambulatory Visit
Admission: RE | Admit: 2020-03-25 | Discharge: 2020-03-25 | Disposition: A | Discharge status: Home / Self Care | Payer: No Typology Code available for payment source | Source: Ambulatory Visit | Attending: Radiation Oncology | Admitting: Radiation Oncology

## 2020-03-25 DIAGNOSIS — Z510 Encounter for antineoplastic radiation therapy: Secondary | ICD-10-CM | POA: Diagnosis not present

## 2020-03-25 NOTE — Progress Notes (Signed)
Pharmacist Chemotherapy Monitoring - Follow Up Assessment    I verify that I have reviewed each item in the below checklist:   Regimen for the patient is scheduled for the appropriate day and plan matches scheduled date.  Appropriate non-routine labs are ordered dependent on drug ordered.  If applicable, additional medications reviewed and ordered per protocol based on lifetime cumulative doses and/or treatment regimen.   Plan for follow-up and/or issues identified: Yes  I-vent associated with next due treatment: Yes  MD and/or nursing notified: No  Sandra Miller 03/25/2020 10:17 AM

## 2020-03-26 ENCOUNTER — Inpatient Hospital Stay: Payer: No Typology Code available for payment source

## 2020-03-26 ENCOUNTER — Inpatient Hospital Stay: Payer: No Typology Code available for payment source | Admitting: Genetic Counselor

## 2020-03-26 ENCOUNTER — Ambulatory Visit
Admission: RE | Admit: 2020-03-26 | Discharge: 2020-03-26 | Disposition: A | Discharge status: Home / Self Care | Payer: No Typology Code available for payment source | Source: Ambulatory Visit | Attending: Radiation Oncology | Admitting: Radiation Oncology

## 2020-03-26 ENCOUNTER — Encounter (HOSPITAL_COMMUNITY): Payer: Self-pay | Admitting: Psychiatry

## 2020-03-26 ENCOUNTER — Telehealth (INDEPENDENT_AMBULATORY_CARE_PROVIDER_SITE_OTHER): Payer: No Typology Code available for payment source | Admitting: Psychiatry

## 2020-03-26 ENCOUNTER — Other Ambulatory Visit (HOSPITAL_COMMUNITY): Payer: Self-pay | Admitting: Psychiatry

## 2020-03-26 ENCOUNTER — Other Ambulatory Visit: Payer: Self-pay

## 2020-03-26 DIAGNOSIS — Z510 Encounter for antineoplastic radiation therapy: Secondary | ICD-10-CM | POA: Diagnosis not present

## 2020-03-26 DIAGNOSIS — F411 Generalized anxiety disorder: Secondary | ICD-10-CM | POA: Diagnosis not present

## 2020-03-26 DIAGNOSIS — F319 Bipolar disorder, unspecified: Secondary | ICD-10-CM | POA: Diagnosis not present

## 2020-03-26 MED ORDER — TRAZODONE HCL 100 MG PO TABS: 100.0000 mg | ORAL_TABLET | Freq: Every day | ORAL | 0 refills | Status: DC

## 2020-03-26 MED ORDER — LORAZEPAM 1 MG PO TABS: 1.0000 mg | ORAL_TABLET | Freq: Three times a day (TID) | ORAL | 2 refills | Status: DC | PRN

## 2020-03-26 MED ORDER — VENLAFAXINE HCL ER 37.5 MG PO CP24: ORAL_CAPSULE | ORAL | 2 refills | Status: DC

## 2020-03-26 MED ORDER — OLANZAPINE 15 MG PO TABS: 15.0000 mg | ORAL_TABLET | Freq: Every day | ORAL | 0 refills | Status: DC

## 2020-03-26 MED FILL — LORazepam 1 MG TABS: 1 | 30 days supply | Qty: 90 | Fill #0

## 2020-03-26 MED FILL — traZODone HCL 100 MG TABS: 100 | 90 days supply | Qty: 90 | Fill #0

## 2020-03-26 MED FILL — VENLAFAXINE HCL ER 37.5 MG: 37.5 | 30 days supply | Qty: 90 | Fill #0

## 2020-03-26 NOTE — Progress Notes (Signed)
Virtual Visit via Telephone Note  I connected with Gregg on 03/26/20 at  9:40 AM EDT by telephone and verified that I am speaking with the correct person using two identifiers.  Location: Patient: home Provider: home office   I discussed the limitations, risks, security and privacy concerns of performing an evaluation and management service by telephone and the availability of in person appointments. I also discussed with the patient that there may be a patient responsible charge related to this service. The patient expressed understanding and agreed to proceed.   History of Present Illness: Patient is evaluated by phone session.  She is going through chemotherapy.  She had a lot of somatic symptoms which include feeling tired, fatigued, nauseous with decreased energy.  However she is committed to finish the treatment.  She feels nervous and anxious prior to chemotherapy.  Sometimes she has difficulty sleeping as she is thinking a lot before she go to sleep but she feels the medicine is working.  Her family is very supportive and she goes with her mother and sister on doctor's appointment.  After finishing chemotherapy for about vulvar cancer she will do radiation and surgery and then her physician will sit down and talk about treatment plan for the breast cancer.  Patient understand that she has long way to go but she is optimistic.  She lost few pounds since the last visit.  She believes because of nausea.  So far she is tolerating venlafaxine, Ativan, olanzapine and trazodone.  She denies any crying spells, suicidal thoughts or homicidal thoughts.  She denies any mania or psychosis.    Past Psychiatric History:Reviewed. H/Obipolar disorder, alcohol dependence and marijuana abuse. H/Oat least 6inpatient.Triedlithium, Seroquel, Lamictal, Paxil, Celexa, Abilify, Geodon,Klonopin,Depakote, Neurontin and Cymbalta. No H/ohistory of suicidal attempt.    Psychiatric Specialty  Exam: Physical Exam  Review of Systems  Constitutional: Positive for fatigue.  Gastrointestinal: Positive for nausea.    Weight 207 lb (93.9 kg).There is no height or weight on file to calculate BMI.  General Appearance: NA  Eye Contact:  NA  Speech:  Slow  Volume:  Decreased  Mood:  Anxious and tired  Affect:  NA  Thought Process:  Goal Directed  Orientation:  Full (Time, Place, and Person)  Thought Content:  Rumination  Suicidal Thoughts:  No  Homicidal Thoughts:  No  Memory:  Immediate;   Good Recent;   Good Remote;   Good  Judgement:  Intact  Insight:  Present  Psychomotor Activity:  NA  Concentration:  Concentration: Good and Attention Span: Good  Recall:  Good  Fund of Knowledge:  Good  Language:  Good  Akathisia:  No  Handed:  Right  AIMS (if indicated):     Assets:  Communication Skills Desire for Improvement Housing Resilience Social Support  ADL's:  Intact  Cognition:  WNL  Sleep:   fair      Assessment and Plan: Bipolar disorder type I.  Generalized anxiety disorder.  Patient started chemotherapy process and after that she will do radiation and then surgery.  She is tolerating other than somatic complaints which include nausea, tired and fatigue.  She has not back to work and her physician recommend she may need to be out of work for at least a year.  After vulvar cancer treatment her physician will address the breast cancer treatment.  She is ready for that.  I encouraged she should consider therapy for coping skills and she agreed with the plan.  We  will not change any psychotropic medication dosage.  She will continue olanzapine 15 mg at bedtime, venlafaxine 112.5 mg daily, lorazepam 1 mg 3 times a day and trazodone 100 mg at bedtime.  We will referred her for therapy.  Discussed medication side effects and benefits.  Recommended to call us back if she feels worsening of the symptoms.  Follow-up in 3 months.    Follow Up Instructions:    I discussed  the assessment and treatment plan with the patient. The patient was provided an opportunity to ask questions and all were answered. The patient agreed with the plan and demonstrated an understanding of the instructions.   The patient was advised to call back or seek an in-person evaluation if the symptoms worsen or if the condition fails to improve as anticipated.  I provided 15 minutes of non-face-to-face time during this encounter.   Kathlee Nations, MD

## 2020-03-27 ENCOUNTER — Ambulatory Visit
Admission: RE | Admit: 2020-03-27 | Discharge: 2020-03-27 | Disposition: A | Discharge status: Home / Self Care | Payer: No Typology Code available for payment source | Source: Ambulatory Visit | Attending: Radiation Oncology | Admitting: Radiation Oncology

## 2020-03-27 ENCOUNTER — Other Ambulatory Visit: Payer: Self-pay

## 2020-03-27 DIAGNOSIS — Z510 Encounter for antineoplastic radiation therapy: Secondary | ICD-10-CM | POA: Diagnosis not present

## 2020-03-27 MED FILL — PROCHLORPERAZINE 10 MG TAB: 10 | 7 days supply | Qty: 30 | Fill #1

## 2020-03-27 NOTE — Progress Notes (Signed)
The patient was seen in the infusion room today.  She reported that she was having diarrhea and nausea.  She was given Lomotil 2 tablets p.o. x1 and Ativan 0.5 mg IV x1.  Her medications were reviewed with her at length.  Sandi Mealy, MHS, PA-C Physician Assistant

## 2020-03-30 ENCOUNTER — Other Ambulatory Visit: Payer: Self-pay

## 2020-03-30 ENCOUNTER — Ambulatory Visit
Admission: RE | Admit: 2020-03-30 | Discharge: 2020-03-30 | Disposition: A | Discharge status: Home / Self Care | Payer: No Typology Code available for payment source | Source: Ambulatory Visit | Attending: Radiation Oncology | Admitting: Radiation Oncology

## 2020-03-30 DIAGNOSIS — Z510 Encounter for antineoplastic radiation therapy: Secondary | ICD-10-CM | POA: Diagnosis not present

## 2020-03-30 NOTE — Progress Notes (Signed)
Patient Care Team: Rutherford Guys, MD as PCP - General (Family Medicine) Mauro Kaufmann, RN as Oncology Nurse Navigator Rockwell Germany, RN as Oncology Nurse Navigator Nicholas Lose, MD as Consulting Physician (Hematology and Oncology) Alphonsa Overall, MD as Consulting Physician (General Surgery) Awanda Mink Craige Cotta, RN as Oncology Nurse Navigator (Oncology)  DIAGNOSIS:    ICD-10-CM   1. Malignant neoplasm of upper-inner quadrant of left breast in female, estrogen receptor positive ([place])  C50.212    Z17.0     SUMMARY OF ONCOLOGIC HISTORY: Oncology History  Vulvar cancer ([place])  01/29/2020 Initial Diagnosis   Vulvar cancer ([place])   03/11/2020 -  Chemotherapy   The patient had dexamethasone (DECADRON) 4 MG tablet, 4 mg (100 % of original dose 4 mg), Oral, Daily, 1 of 1 cycle, Start date: 02/27/2020, End date: -- Dose modification: 4 mg (original dose 4 mg, Cycle 0) palonosetron (ALOXI) injection 0.25 mg, 0.25 mg, Intravenous,  Once, 1 of 1 cycle Administration: 0.25 mg (03/11/2020), 0.25 mg (03/17/2020), 0.25 mg (03/24/2020) CARBOplatin (PARAPLATIN) 300 mg in sodium chloride 0.9 % 250 mL chemo infusion, 300 mg (100 % of original dose 300 mg), Intravenous,  Once, 1 of 1 cycle Dose modification:   (original dose 300 mg, Cycle 1) Administration: 300 mg (03/11/2020), 290 mg (03/17/2020), 300 mg (03/24/2020) PACLitaxel (TAXOL) 108 mg in sodium chloride 0.9 % 250 mL chemo infusion (</= 56m/m2), 50 mg/m2 = 108 mg, Intravenous,  Once, 1 of 1 cycle Administration: 108 mg (03/11/2020), 108 mg (03/17/2020), 108 mg (03/24/2020) fosaprepitant (EMEND) 150 mg in sodium chloride 0.9 % 145 mL IVPB, 150 mg, Intravenous,  Once, 1 of 1 cycle Administration: 150 mg (03/24/2020) trastuzumab-anns (KANJINTI) 378 mg in sodium chloride 0.9 % 250 mL chemo infusion, 4 mg/kg = 378 mg (100 % of original dose 4 mg/kg), Intravenous,  Once, 1 of 1 cycle Dose modification: 4 mg/kg (original dose 4 mg/kg, Cycle 1, Reason: Other (see  comments), Comment: loading dose) Administration: 378 mg (03/11/2020), 189 mg (03/17/2020), 189 mg (03/24/2020)  for chemotherapy treatment.    Malignant neoplasm of upper-inner quadrant of left breast in female, estrogen receptor positive [place]  02/20/2020 Initial Diagnosis   PET scan on 02/07/20 following a diagnosis of vulvar cancer showed a 1.7cm left breast mass and mildly hypermetabolic left axillary lymph nodes. [place] and [place] showed a 3.1cm left breast mass at the 11 o'clock position, palpable on exam, and a single abnormal left axillary lymph node with cortical thickening. Left breast biopsy on 02/20/20 showed invasive and in situ carcinoma in the breast and axilla, grade 3, HER-2 positive (3+), ER/PR+ 95%, Ki67 75%   02/25/2020 Cancer Staging   Staging form: Breast, AJCC 8th Edition - Clinical stage from 02/25/2020: Stage IB (cT2, cN1, cM0, G3, ER+, PR+, HER2+) - Signed by GNicholas Lose MD on 02/25/2020     CHIEF COMPLIANT: Cycle 4 Taxol, Carboplatin, and Herceptin  INTERVAL HISTORY: Sandra Miller is a 44y.o. with above-mentioned history of left breast cancer and vulvar cancer currently on neoadjuvant chemotherapy treatment with weekly Taxol, Carboplatin, and Herceptin. She presents to the clinic todayfor treatment.   Her major complaint today is persistent nausea.  She has not vomited.  She also has diarrhea.  She was started on Lomotil and the diarrhea got better in which she is still getting 3 loose stools per day.  She is extremely excoriated from the radiation standpoint in the vaginal area.  She has not been eating  well because of the nausea.  The nausea medications give her 2 to 3 hours of relief and then nausea comes back.  We are regimented with the last treatment but in spite of that she still has nausea.  She thinks it slightly better than before.  ALLERGIES:  is allergic to depakote [divalproex sodium], minocycline, and aspirin.  MEDICATIONS:  Current Outpatient  Medications  Medication Sig Dispense Refill  . acetaminophen (TYLENOL) 500 MG tablet Take 1,000 mg by mouth every 6 (six) hours as needed for moderate pain or fever.    Marland Kitchen dexamethasone (DECADRON) 4 MG tablet Take 1 tablet (4 mg total) by mouth daily. Start the day after chemotherapy for 2 days. 30 tablet 0  . diphenoxylate-atropine (LOMOTIL) 2.5-0.025 MG tablet 1 to 2 PO QID prn diarrhea 45 tablet 1  . fluconazole (DIFLUCAN) 150 MG tablet Take 1 tablet (150 mg total) by mouth daily. Take 2nd tablet 72 hours later 2 tablet 0  . gabapentin (NEURONTIN) 300 MG capsule Take 1 capsule (300 mg total) by mouth daily. 90 capsule 3  . HYDROcodone-acetaminophen (NORCO/VICODIN) 5-325 MG tablet Take 1 tablet by mouth every 6 (six) hours as needed for moderate pain. 12 tablet 0  . lidocaine-prilocaine (EMLA) cream Apply to affected area once 30 g 3  . lisinopril-hydrochlorothiazide (ZESTORETIC) 20-12.5 MG tablet Take 1 tablet by mouth daily. 90 tablet 1  . LORazepam (ATIVAN) 1 MG tablet Take 1 tablet (1 mg total) by mouth 3 (three) times daily as needed for anxiety. 90 tablet 2  . Multiple Vitamin (MULTIVITAMIN) tablet Take 1 tablet by mouth daily.    . nicotine (NICODERM CQ - DOSED IN MG/24 HOURS) 21 mg/24hr patch Place 1 patch (21 mg total) onto the skin daily. 28 patch 0  . OLANZapine (ZYPREXA) 15 MG tablet Take 1 tablet (15 mg total) by mouth at bedtime. 90 tablet 0  . omeprazole (PRILOSEC) 40 MG capsule TAKE 1 CAPSULE(40 MG) BY MOUTH TWICE DAILY BEFORE A MEAL 180 capsule 0  . ondansetron (ZOFRAN) 8 MG tablet Take 1 tablet (8 mg total) by mouth 2 (two) times daily as needed for refractory nausea / vomiting. Start on day 3 after chemo. 30 tablet 1  . potassium chloride SA (KLOR-CON) 20 MEQ tablet Take 1 tablet (20 mEq total) by mouth daily. 30 tablet 0  . Probiotic Product (PROBIOTIC DAILY PO) Take by mouth.    . prochlorperazine (COMPAZINE) 10 MG tablet Take 1 tablet (10 mg total) by mouth every 6 (six)  hours as needed (Nausea or vomiting). 30 tablet 1  . topiramate (TOPAMAX) 25 MG tablet Take 1 tablet (25 mg total) by mouth at bedtime. 90 tablet 0  . traZODone (DESYREL) 100 MG tablet Take 1 tablet (100 mg total) by mouth at bedtime. 90 tablet 0  . venlafaxine XR (EFFEXOR XR) 37.5 MG 24 hr capsule Take three capsule (112.5 mg total) by mouth daily. 90 capsule 2   No current facility-administered medications for this visit.    PHYSICAL EXAMINATION: ECOG PERFORMANCE STATUS: 1 - Symptomatic but completely ambulatory  Vitals:   03/31/20 1200  BP: 124/79  Pulse: 94  Resp: 18  Temp: 98.7 F (37.1 C)  SpO2: 100%   Filed Weights   03/31/20 1200  Weight: (!) 206 lb 14.4 oz (93.8 kg)    LABORATORY DATA:  I have reviewed the data as listed CMP Latest Ref Rng & Units 03/24/2020 03/17/2020 03/11/2020  Glucose 70 - 99 mg/dL 111(H) 118(H) 130(H)  BUN  6 - 20 mg/dL _0 Creatinine 0.44 - 1.00 mg/dL 0.76 0.90 0.85  Sodium 135 - 145 mmol/L 138 138 143  Potassium 3.5 - 5.1 mmol/L 3.3(L) 3.5 2.7(LL)  Chloride 98 - 111 mmol/L 108 106 106  CO2 22 - 32 mmol/L 20(L) 22 24  Calcium 8.9 - 10.3 mg/dL 9.4 10.1 9.9  Total Protein 6.5 - 8.1 g/dL 7.5 7.3 7.5  Total Bilirubin 0.3 - 1.2 mg/dL 0.6 0.7 0.5  Alkaline Phos 38 - 126 U/L 53 55 55  AST 15 - 41 U/L 37 53(H) 23  ALT 0 - 44 U/L 66(H) 94(H) 28    Lab Results  Component Value Date   WBC 6.4 03/24/2020   HGB 12.1 03/24/2020   HCT 36.0 03/24/2020   MCV 87.2 03/24/2020   PLT 288 03/24/2020   NEUTROABS 5.3 03/24/2020    ASSESSMENT & PLAN:  Malignant neoplasm of upper-inner quadrant of left breast in female, estrogen receptor positive ([place]) Breast cancer and vulvar cancers  02/20/2020:PET scan on 02/07/20 following a diagnosis of vulvar cancer showed a 1.7cm left breast mass and mildly hypermetabolic left axillary lymph nodes. Mammogram and [place] on 02/18/20 showed a 3.1cm left breast mass at the 11 o'clock position, palpable on exam, and a single  abnormal left axillary lymph node with cortical thickening. Left breast biopsy on 02/20/20 showed invasive and in situ carcinoma in the breast and axilla, grade 3, HER-2 positive (3+), ER/PR+ 95%, Ki67 75%  Treatment plan: 1.Neoadjuvant chemoradiation for vulvar cancer with Taxol, carboplatin, Herceptin weekly x6 cycles 2.neoadjuvant chemotherapy with TCH Perjeta x3 cycles 3.Evaluation for breast conserving surgery if possible with sentinel lymph node study 3. Followed by adjuvant radiation therapy if patient had lumpectomy ------------------------------------------------------------------------------------------------------------------------------- Current treatment: Cycle 4 Taxol carboplatin Herceptin, weekly with radiation Labs reviewed Echocardiogram: 02/23/2020: EF 55 to 60% Chemo toxicities:  Nausea: In spite of multiple antinausea medications.  I will reduce the dosage of her chemo further today. Leukopenia Diarrhea: Currently on Lomotil and Imodium.  Monitoring closely for toxicities  Return to clinic weekly for chemotherapy treatments.  No orders of the defined types were placed in this encounter.  The patient has a good understanding of the overall plan. she agrees with it. she will call with any problems that may develop before the next visit here.  Total time spent: 30 mins including face to face time and time spent for planning, charting and coordination of care  Nicholas Lose, MD 03/31/2020  I, Cloyde Reams Dorshimer, am acting as scribe for Dr. Nicholas Lose.  I have reviewed the above documentation for accuracy and completeness, and I agree with the above.

## 2020-03-31 ENCOUNTER — Other Ambulatory Visit: Payer: Self-pay

## 2020-03-31 ENCOUNTER — Inpatient Hospital Stay: Payer: No Typology Code available for payment source

## 2020-03-31 ENCOUNTER — Ambulatory Visit
Admission: RE | Admit: 2020-03-31 | Discharge: 2020-03-31 | Disposition: A | Discharge status: Home / Self Care | Payer: No Typology Code available for payment source | Source: Ambulatory Visit | Attending: Radiation Oncology | Admitting: Radiation Oncology

## 2020-03-31 ENCOUNTER — Inpatient Hospital Stay (HOSPITAL_BASED_OUTPATIENT_CLINIC_OR_DEPARTMENT_OTHER): Payer: No Typology Code available for payment source | Admitting: Hematology and Oncology

## 2020-03-31 DIAGNOSIS — C50212 Malignant neoplasm of upper-inner quadrant of left female breast: Secondary | ICD-10-CM | POA: Diagnosis not present

## 2020-03-31 DIAGNOSIS — Z170 Estrogen receptor positive status [ER+]: Secondary | ICD-10-CM | POA: Diagnosis not present

## 2020-03-31 DIAGNOSIS — C519 Malignant neoplasm of vulva, unspecified: Secondary | ICD-10-CM

## 2020-03-31 DIAGNOSIS — Z5111 Encounter for antineoplastic chemotherapy: Secondary | ICD-10-CM | POA: Diagnosis not present

## 2020-03-31 DIAGNOSIS — Z510 Encounter for antineoplastic radiation therapy: Secondary | ICD-10-CM | POA: Diagnosis not present

## 2020-03-31 DIAGNOSIS — Z95828 Presence of other vascular implants and grafts: Secondary | ICD-10-CM

## 2020-03-31 LAB — CMP (CANCER CENTER ONLY)
ALT: 51 U/L — ABNORMAL HIGH (ref 0–44)
AST: 28 U/L (ref 15–41)
Albumin: 3.9 g/dL (ref 3.5–5.0)
Alkaline Phosphatase: 54 U/L (ref 38–126)
Anion gap: 9 (ref 5–15)
BUN: 11 mg/dL (ref 6–20)
CO2: 22 mmol/L (ref 22–32)
Calcium: 10.3 mg/dL (ref 8.9–10.3)
Chloride: 107 mmol/L (ref 98–111)
Creatinine: 0.72 mg/dL (ref 0.44–1.00)
GFR, Est AFR Am: >60 mL/min (ref >60)
GFR, Estimated: >60 mL/min (ref >60)
Glucose, Bld: 95 mg/dL (ref 70–99)
Potassium: 3.4 mmol/L — ABNORMAL LOW (ref 3.5–5.1)
Sodium: 138 mmol/L (ref 135–145)
Total Bilirubin: 0.3 mg/dL (ref 0.3–1.2)
Total Protein: 7.3 g/dL (ref 6.5–8.1)

## 2020-03-31 LAB — CBC WITH DIFFERENTIAL (CANCER CENTER ONLY)
Abs Immature Granulocytes: 0.04 10*3/uL (ref 0.00–0.07)
Basophils Absolute: 0 10*3/uL (ref 0.0–0.1)
Basophils Relative: 1 %
Eosinophils Absolute: 0 10*3/uL (ref 0.0–0.5)
Eosinophils Relative: 0 %
HCT: 33.2 % — ABNORMAL LOW (ref 36.0–46.0)
Hemoglobin: 11.3 g/dL — ABNORMAL LOW (ref 12.0–15.0)
Immature Granulocytes: 1 %
Lymphocytes Relative: 27 %
Lymphs Abs: 0.9 10*3/uL (ref 0.7–4.0)
MCH: 29.2 pg (ref 26.0–34.0)
MCHC: 34 g/dL (ref 30.0–36.0)
MCV: 85.8 fL (ref 80.0–100.0)
Monocytes Absolute: 0.2 10*3/uL (ref 0.1–1.0)
Monocytes Relative: 6 %
Neutro Abs: 2.3 10*3/uL (ref 1.7–7.7)
Neutrophils Relative %: 65 %
Platelet Count: 167 10*3/uL (ref 150–400)
RBC: 3.87 MIL/uL (ref 3.87–5.11)
RDW: 14.1 % (ref 11.5–15.5)
WBC Count: 3.5 10*3/uL — ABNORMAL LOW (ref 4.0–10.5)
nRBC: 0 % (ref 0.0–0.2)

## 2020-03-31 MED ORDER — ACETAMINOPHEN 325 MG PO TABS
ORAL_TABLET | ORAL | Status: AC
  Filled 2020-03-31: qty 2

## 2020-03-31 MED ORDER — SODIUM CHLORIDE 0.9% FLUSH
10.0000 mL | Freq: Once | INTRAVENOUS | Status: AC
  Administered 2020-03-31: 10 mL
  Filled 2020-03-31: qty 10

## 2020-03-31 MED ORDER — FAMOTIDINE IN NACL 20-0.9 MG/50ML-% IV SOLN
INTRAVENOUS | Status: AC
  Filled 2020-03-31: qty 50

## 2020-03-31 MED ORDER — DIPHENHYDRAMINE HCL 50 MG/ML IJ SOLN
25.0000 mg | Freq: Once | INTRAMUSCULAR | Status: AC
  Administered 2020-03-31: 25 mg via INTRAVENOUS

## 2020-03-31 MED ORDER — SODIUM CHLORIDE 0.9 % IV SOLN
Freq: Once | INTRAVENOUS | Status: AC
  Filled 2020-03-31: qty 250

## 2020-03-31 MED ORDER — FAMOTIDINE IN NACL 20-0.9 MG/50ML-% IV SOLN
20.0000 mg | Freq: Once | INTRAVENOUS | Status: AC
  Administered 2020-03-31: 20 mg via INTRAVENOUS

## 2020-03-31 MED ORDER — SODIUM CHLORIDE 0.9 % IV SOLN
225.0000 mg | Freq: Once | INTRAVENOUS | Status: AC
  Administered 2020-03-31: 230 mg via INTRAVENOUS
  Filled 2020-03-31: qty 23

## 2020-03-31 MED ORDER — ACETAMINOPHEN 325 MG PO TABS
650.0000 mg | ORAL_TABLET | Freq: Once | ORAL | Status: AC
  Administered 2020-03-31: 650 mg via ORAL

## 2020-03-31 MED ORDER — PALONOSETRON HCL INJECTION 0.25 MG/5ML
INTRAVENOUS | Status: AC
  Filled 2020-03-31: qty 5

## 2020-03-31 MED ORDER — SODIUM CHLORIDE 0.9 % IV SOLN
10.0000 mg | Freq: Once | INTRAVENOUS | Status: AC
  Administered 2020-03-31: 10 mg via INTRAVENOUS
  Filled 2020-03-31: qty 10

## 2020-03-31 MED ORDER — SODIUM CHLORIDE 0.9% FLUSH
10.0000 mL | INTRAVENOUS | Status: DC | PRN
  Administered 2020-03-31: 10 mL
  Filled 2020-03-31: qty 10

## 2020-03-31 MED ORDER — HEPARIN SOD (PORK) LOCK FLUSH 100 UNIT/ML IV SOLN
500.0000 [IU] | Freq: Once | INTRAVENOUS | Status: AC | PRN
  Administered 2020-03-31: 500 [IU]
  Filled 2020-03-31: qty 5

## 2020-03-31 MED ORDER — TRASTUZUMAB-ANNS CHEMO 150 MG IV SOLR
2.0000 mg/kg | Freq: Once | INTRAVENOUS | Status: AC
  Administered 2020-03-31: 189 mg via INTRAVENOUS
  Filled 2020-03-31: qty 9

## 2020-03-31 MED ORDER — SODIUM CHLORIDE 0.9 % IV SOLN
150.0000 mg | Freq: Once | INTRAVENOUS | Status: AC
  Administered 2020-03-31: 150 mg via INTRAVENOUS
  Filled 2020-03-31: qty 150

## 2020-03-31 MED ORDER — PALONOSETRON HCL INJECTION 0.25 MG/5ML
0.2500 mg | Freq: Once | INTRAVENOUS | Status: AC
  Administered 2020-03-31: 0.25 mg via INTRAVENOUS

## 2020-03-31 MED ORDER — SODIUM CHLORIDE 0.9 % IV SOLN
45.0000 mg/m2 | Freq: Once | INTRAVENOUS | Status: AC
  Administered 2020-03-31: 96 mg via INTRAVENOUS
  Filled 2020-03-31: qty 16

## 2020-03-31 MED ORDER — DIPHENHYDRAMINE HCL 50 MG/ML IJ SOLN
INTRAMUSCULAR | Status: AC
  Filled 2020-03-31: qty 1

## 2020-03-31 NOTE — Patient Instructions (Signed)
[facility] Discharge Instructions for Patients Receiving Chemotherapy  Today you received the following immunotherapy agent: Trastuzumab and chemotherapy agents: Paclitaxel (Taxol) and Carboplatin  To help prevent nausea and vomiting after your treatment, we encourage you to take your nausea medication as directed by your MD.   If you develop nausea and vomiting that is not controlled by your nausea medication, call the clinic.   BELOW ARE SYMPTOMS THAT SHOULD BE REPORTED IMMEDIATELY:  *FEVER GREATER THAN 100.5 F  *CHILLS WITH OR WITHOUT FEVER  NAUSEA AND VOMITING THAT IS NOT CONTROLLED WITH YOUR NAUSEA MEDICATION  *UNUSUAL SHORTNESS OF BREATH  *UNUSUAL BRUISING OR BLEEDING  TENDERNESS IN MOUTH AND THROAT WITH OR WITHOUT PRESENCE OF ULCERS  *URINARY PROBLEMS  *BOWEL PROBLEMS  UNUSUAL RASH Items with * indicate a potential emergency and should be followed up as soon as possible.  Feel free to call the clinic should you have any questions or concerns. The clinic phone number is [phone].  Please show the [facility] at check-in to the Emergency Department and triage nurse.

## 2020-03-31 NOTE — Assessment & Plan Note (Signed)
Breast cancer and vulvar cancers  02/20/2020:PET scan on 02/07/20 following a diagnosis of vulvar cancer showed a 1.7cm left breast mass and mildly hypermetabolic left axillary lymph nodes. Mammogram and [place] on 02/18/20 showed a 3.1cm left breast mass at the 11 o'clock position, palpable on exam, and a single abnormal left axillary lymph node with cortical thickening. Left breast biopsy on 02/20/20 showed invasive and in situ carcinoma in the breast and axilla, grade 3, HER-2 positive (3+), ER/PR+ 95%, Ki67 75%  Treatment plan: 1.Neoadjuvant chemoradiation for vulvar cancer with Taxol, carboplatin, Herceptin weekly x6 cycles 2.neoadjuvant chemotherapy with TCH Perjeta x3 cycles 3.Evaluation for breast conserving surgery if possible with sentinel lymph node study 3. Followed by adjuvant radiation therapy if patient had lumpectomy ------------------------------------------------------------------------------------------------------------------------------- Current treatment: Cycle 4 Taxol carboplatin Herceptin, weekly with radiation Labs reviewed Echocardiogram: 02/23/2020: EF 55 to 60% Chemo toxicities:  Nausea that lasted for few hours. Monitoring closely for toxicities  Return to clinic weekly for chemotherapy treatments.

## 2020-04-01 ENCOUNTER — Telehealth: Payer: Self-pay | Admitting: Hematology and Oncology

## 2020-04-01 ENCOUNTER — Other Ambulatory Visit: Payer: Self-pay

## 2020-04-01 ENCOUNTER — Ambulatory Visit
Admission: RE | Admit: 2020-04-01 | Discharge: 2020-04-01 | Disposition: A | Discharge status: Home / Self Care | Payer: No Typology Code available for payment source | Source: Ambulatory Visit | Attending: Radiation Oncology | Admitting: Radiation Oncology

## 2020-04-01 ENCOUNTER — Other Ambulatory Visit (HOSPITAL_COMMUNITY): Payer: Self-pay | Admitting: Psychiatry

## 2020-04-01 DIAGNOSIS — F319 Bipolar disorder, unspecified: Secondary | ICD-10-CM

## 2020-04-01 DIAGNOSIS — Z510 Encounter for antineoplastic radiation therapy: Secondary | ICD-10-CM | POA: Diagnosis not present

## 2020-04-01 NOTE — Telephone Encounter (Signed)
Scheduled per 7/27 los. Called and spoke with pt, confirmed 8/10 and 8/17 appts

## 2020-04-02 ENCOUNTER — Encounter: Payer: Self-pay | Admitting: Hematology and Oncology

## 2020-04-02 ENCOUNTER — Ambulatory Visit
Admission: RE | Admit: 2020-04-02 | Discharge: 2020-04-02 | Disposition: A | Discharge status: Home / Self Care | Payer: No Typology Code available for payment source | Source: Ambulatory Visit | Attending: Radiation Oncology | Admitting: Radiation Oncology

## 2020-04-02 ENCOUNTER — Other Ambulatory Visit: Payer: Self-pay | Admitting: Radiation Oncology

## 2020-04-02 DIAGNOSIS — Z510 Encounter for antineoplastic radiation therapy: Secondary | ICD-10-CM | POA: Diagnosis not present

## 2020-04-02 MED ORDER — FLUCONAZOLE 150 MG PO TABS: 150.0000 mg | ORAL_TABLET | Freq: Every day | ORAL | 0 refills | Status: DC

## 2020-04-02 MED ORDER — LIDOCAINE 5 % EX OINT
1.0000 | TOPICAL_OINTMENT | Freq: Three times a day (TID) | CUTANEOUS | 0 refills | Status: DC | PRN
Start: 2020-04-02 — End: 2020-06-19

## 2020-04-02 MED FILL — TOPIRAMATE 25 MG TABLET: 25 | 90 days supply | Qty: 90 | Fill #0

## 2020-04-02 NOTE — Progress Notes (Signed)
Emailed application to [employer] for additional financial assistance.

## 2020-04-03 ENCOUNTER — Ambulatory Visit: Payer: No Typology Code available for payment source

## 2020-04-03 MED FILL — FLUCONAZOLE 150 MG TABS: 150 | 3 days supply | Qty: 2 | Fill #0

## 2020-04-03 MED FILL — LIDOCAINE 5 % OINT: 5 | 15 days supply | Qty: 35 | Fill #0

## 2020-04-03 MED FILL — DIPHENOXYLATE-ATROPINE 2.5-: 2.5-0.025 | 5 days supply | Qty: 45 | Fill #1

## 2020-04-06 ENCOUNTER — Other Ambulatory Visit: Payer: Self-pay

## 2020-04-06 ENCOUNTER — Ambulatory Visit
Admission: RE | Admit: 2020-04-06 | Discharge: 2020-04-06 | Disposition: A | Discharge status: Home / Self Care | Payer: No Typology Code available for payment source | Source: Ambulatory Visit | Attending: Radiation Oncology | Admitting: Radiation Oncology

## 2020-04-06 DIAGNOSIS — Z510 Encounter for antineoplastic radiation therapy: Secondary | ICD-10-CM | POA: Insufficient documentation

## 2020-04-06 DIAGNOSIS — R300 Dysuria: Secondary | ICD-10-CM | POA: Diagnosis not present

## 2020-04-06 DIAGNOSIS — Z5112 Encounter for antineoplastic immunotherapy: Secondary | ICD-10-CM | POA: Diagnosis present

## 2020-04-06 DIAGNOSIS — R112 Nausea with vomiting, unspecified: Secondary | ICD-10-CM | POA: Diagnosis not present

## 2020-04-06 DIAGNOSIS — C519 Malignant neoplasm of vulva, unspecified: Secondary | ICD-10-CM | POA: Insufficient documentation

## 2020-04-06 DIAGNOSIS — Z170 Estrogen receptor positive status [ER+]: Secondary | ICD-10-CM | POA: Diagnosis not present

## 2020-04-06 DIAGNOSIS — F17210 Nicotine dependence, cigarettes, uncomplicated: Secondary | ICD-10-CM | POA: Diagnosis not present

## 2020-04-06 DIAGNOSIS — C50212 Malignant neoplasm of upper-inner quadrant of left female breast: Secondary | ICD-10-CM | POA: Diagnosis not present

## 2020-04-06 DIAGNOSIS — Z5111 Encounter for antineoplastic chemotherapy: Secondary | ICD-10-CM | POA: Diagnosis not present

## 2020-04-07 ENCOUNTER — Inpatient Hospital Stay: Payer: No Typology Code available for payment source

## 2020-04-07 ENCOUNTER — Encounter: Payer: Self-pay | Admitting: *Deleted

## 2020-04-07 ENCOUNTER — Inpatient Hospital Stay: Payer: No Typology Code available for payment source | Attending: Gynecologic Oncology

## 2020-04-07 ENCOUNTER — Ambulatory Visit
Admission: RE | Admit: 2020-04-07 | Discharge: 2020-04-07 | Disposition: A | Discharge status: Home / Self Care | Payer: No Typology Code available for payment source | Source: Ambulatory Visit | Attending: Radiation Oncology | Admitting: Radiation Oncology

## 2020-04-07 ENCOUNTER — Other Ambulatory Visit: Payer: Self-pay

## 2020-04-07 VITALS — BP 125/70 | HR 85 | Temp 98.7°F | Resp 18

## 2020-04-07 DIAGNOSIS — R300 Dysuria: Secondary | ICD-10-CM | POA: Insufficient documentation

## 2020-04-07 DIAGNOSIS — C50212 Malignant neoplasm of upper-inner quadrant of left female breast: Secondary | ICD-10-CM | POA: Insufficient documentation

## 2020-04-07 DIAGNOSIS — F17210 Nicotine dependence, cigarettes, uncomplicated: Secondary | ICD-10-CM | POA: Insufficient documentation

## 2020-04-07 DIAGNOSIS — C519 Malignant neoplasm of vulva, unspecified: Secondary | ICD-10-CM | POA: Insufficient documentation

## 2020-04-07 DIAGNOSIS — Z170 Estrogen receptor positive status [ER+]: Secondary | ICD-10-CM | POA: Insufficient documentation

## 2020-04-07 DIAGNOSIS — Z5112 Encounter for antineoplastic immunotherapy: Secondary | ICD-10-CM | POA: Insufficient documentation

## 2020-04-07 DIAGNOSIS — Z95828 Presence of other vascular implants and grafts: Secondary | ICD-10-CM

## 2020-04-07 DIAGNOSIS — Z510 Encounter for antineoplastic radiation therapy: Secondary | ICD-10-CM | POA: Insufficient documentation

## 2020-04-07 DIAGNOSIS — R112 Nausea with vomiting, unspecified: Secondary | ICD-10-CM | POA: Insufficient documentation

## 2020-04-07 DIAGNOSIS — Z5111 Encounter for antineoplastic chemotherapy: Secondary | ICD-10-CM | POA: Diagnosis not present

## 2020-04-07 LAB — CBC WITH DIFFERENTIAL (CANCER CENTER ONLY)
Abs Immature Granulocytes: 0.02 10*3/uL (ref 0.00–0.07)
Basophils Absolute: 0 10*3/uL (ref 0.0–0.1)
Basophils Relative: 1 %
Eosinophils Absolute: 0 10*3/uL (ref 0.0–0.5)
Eosinophils Relative: 0 %
HCT: 29.7 % — ABNORMAL LOW (ref 36.0–46.0)
Hemoglobin: 10.3 g/dL — ABNORMAL LOW (ref 12.0–15.0)
Immature Granulocytes: 1 %
Lymphocytes Relative: 20 %
Lymphs Abs: 0.8 10*3/uL (ref 0.7–4.0)
MCH: 29.8 pg (ref 26.0–34.0)
MCHC: 34.7 g/dL (ref 30.0–36.0)
MCV: 85.8 fL (ref 80.0–100.0)
Monocytes Absolute: 0.3 10*3/uL (ref 0.1–1.0)
Monocytes Relative: 6 %
Neutro Abs: 3.1 10*3/uL (ref 1.7–7.7)
Neutrophils Relative %: 72 %
Platelet Count: 138 10*3/uL — ABNORMAL LOW (ref 150–400)
RBC: 3.46 MIL/uL — ABNORMAL LOW (ref 3.87–5.11)
RDW: 14.6 % (ref 11.5–15.5)
WBC Count: 4.2 10*3/uL (ref 4.0–10.5)
nRBC: 0.5 % — ABNORMAL HIGH (ref 0.0–0.2)

## 2020-04-07 LAB — CMP (CANCER CENTER ONLY)
ALT: 45 U/L — ABNORMAL HIGH (ref 0–44)
AST: 27 U/L (ref 15–41)
Albumin: 3.7 g/dL (ref 3.5–5.0)
Alkaline Phosphatase: 55 U/L (ref 38–126)
Anion gap: 8 (ref 5–15)
BUN: 10 mg/dL (ref 6–20)
CO2: 20 mmol/L — ABNORMAL LOW (ref 22–32)
Calcium: 9.2 mg/dL (ref 8.9–10.3)
Chloride: 110 mmol/L (ref 98–111)
Creatinine: 0.72 mg/dL (ref 0.44–1.00)
GFR, Est AFR Am: >60 mL/min (ref >60)
GFR, Estimated: >60 mL/min (ref >60)
Glucose, Bld: 101 mg/dL — ABNORMAL HIGH (ref 70–99)
Potassium: 3.5 mmol/L (ref 3.5–5.1)
Sodium: 138 mmol/L (ref 135–145)
Total Bilirubin: 0.3 mg/dL (ref 0.3–1.2)
Total Protein: 6.9 g/dL (ref 6.5–8.1)

## 2020-04-07 MED ORDER — SODIUM CHLORIDE 0.9% FLUSH
10.0000 mL | INTRAVENOUS | Status: DC | PRN
  Administered 2020-04-07: 10 mL
  Filled 2020-04-07: qty 10

## 2020-04-07 MED ORDER — FAMOTIDINE IN NACL 20-0.9 MG/50ML-% IV SOLN
20.0000 mg | Freq: Once | INTRAVENOUS | Status: AC
  Administered 2020-04-07: 20 mg via INTRAVENOUS

## 2020-04-07 MED ORDER — PALONOSETRON HCL INJECTION 0.25 MG/5ML
0.2500 mg | Freq: Once | INTRAVENOUS | Status: AC
  Administered 2020-04-07: 0.25 mg via INTRAVENOUS

## 2020-04-07 MED ORDER — FAMOTIDINE IN NACL 20-0.9 MG/50ML-% IV SOLN
INTRAVENOUS | Status: AC
  Filled 2020-04-07: qty 50

## 2020-04-07 MED ORDER — SODIUM CHLORIDE 0.9 % IV SOLN
150.0000 mg | Freq: Once | INTRAVENOUS | Status: AC
  Administered 2020-04-07: 150 mg via INTRAVENOUS
  Filled 2020-04-07: qty 150

## 2020-04-07 MED ORDER — TRASTUZUMAB-ANNS CHEMO 150 MG IV SOLR
2.0000 mg/kg | Freq: Once | INTRAVENOUS | Status: AC
  Administered 2020-04-07: 189 mg via INTRAVENOUS
  Filled 2020-04-07: qty 9

## 2020-04-07 MED ORDER — SODIUM CHLORIDE 0.9 % IV SOLN
10.0000 mg | Freq: Once | INTRAVENOUS | Status: AC
  Administered 2020-04-07: 10 mg via INTRAVENOUS
  Filled 2020-04-07: qty 10

## 2020-04-07 MED ORDER — DIPHENHYDRAMINE HCL 50 MG/ML IJ SOLN
INTRAMUSCULAR | Status: AC
  Filled 2020-04-07: qty 1

## 2020-04-07 MED ORDER — HEPARIN SOD (PORK) LOCK FLUSH 100 UNIT/ML IV SOLN
500.0000 [IU] | Freq: Once | INTRAVENOUS | Status: AC | PRN
  Administered 2020-04-07: 500 [IU]
  Filled 2020-04-07: qty 5

## 2020-04-07 MED ORDER — DIPHENHYDRAMINE HCL 50 MG/ML IJ SOLN
25.0000 mg | Freq: Once | INTRAMUSCULAR | Status: AC
  Administered 2020-04-07: 25 mg via INTRAVENOUS

## 2020-04-07 MED ORDER — SODIUM CHLORIDE 0.9 % IV SOLN
45.0000 mg/m2 | Freq: Once | INTRAVENOUS | Status: AC
  Administered 2020-04-07: 96 mg via INTRAVENOUS
  Filled 2020-04-07: qty 16

## 2020-04-07 MED ORDER — SODIUM CHLORIDE 0.9% FLUSH
10.0000 mL | Freq: Once | INTRAVENOUS | Status: AC
  Administered 2020-04-07: 10 mL
  Filled 2020-04-07: qty 10

## 2020-04-07 MED ORDER — SODIUM CHLORIDE 0.9 % IV SOLN
Freq: Once | INTRAVENOUS | Status: AC
  Filled 2020-04-07: qty 250

## 2020-04-07 MED ORDER — SODIUM CHLORIDE 0.9 % IV SOLN
225.0000 mg | Freq: Once | INTRAVENOUS | Status: AC
  Administered 2020-04-07: 230 mg via INTRAVENOUS
  Filled 2020-04-07: qty 23

## 2020-04-07 MED ORDER — ACETAMINOPHEN 325 MG PO TABS
650.0000 mg | ORAL_TABLET | Freq: Once | ORAL | Status: AC
  Administered 2020-04-07: 650 mg via ORAL

## 2020-04-07 MED ORDER — ACETAMINOPHEN 325 MG PO TABS
ORAL_TABLET | ORAL | Status: AC
  Filled 2020-04-07: qty 2

## 2020-04-07 MED ORDER — PALONOSETRON HCL INJECTION 0.25 MG/5ML
INTRAVENOUS | Status: AC
  Filled 2020-04-07: qty 5

## 2020-04-07 NOTE — Patient Instructions (Signed)
[facility] Discharge Instructions for Patients Receiving Chemotherapy  Today you received the following chemotherapy agents: Trastuzumab, Taxol, Carboplatin  To help prevent nausea and vomiting after your treatment, we encourage you to take your nausea medication as directed.    If you develop nausea and vomiting that is not controlled by your nausea medication, call the clinic.   BELOW ARE SYMPTOMS THAT SHOULD BE REPORTED IMMEDIATELY:  *FEVER GREATER THAN 100.5 F  *CHILLS WITH OR WITHOUT FEVER  NAUSEA AND VOMITING THAT IS NOT CONTROLLED WITH YOUR NAUSEA MEDICATION  *UNUSUAL SHORTNESS OF BREATH  *UNUSUAL BRUISING OR BLEEDING  TENDERNESS IN MOUTH AND THROAT WITH OR WITHOUT PRESENCE OF ULCERS  *URINARY PROBLEMS  *BOWEL PROBLEMS  UNUSUAL RASH Items with * indicate a potential emergency and should be followed up as soon as possible.  Feel free to call the clinic should you have any questions or concerns. The clinic phone number is [phone].  Please show the [place] at check-in to the Emergency Department and triage nurse.

## 2020-04-07 NOTE — Progress Notes (Signed)
Nutrition Assessment   Reason for Assessment: Patient identified on Malnutrition Screening report for weight loss and poor appetite   ASSESSMENT:  44 year old female with vulvar cancer and left breast cancer. Patient receiving neoadjuvant chemotherapy and radiation for vulvar cancer.    Met with patient today in infusion.  Patient reports poor appetite due to nausea and diarrhea.  Reports that mother and adult kids encourage her to eat.  She is taking medications to help with nausea and diarrhea.  Has tried ensure/boost shakes.     Medications: imodium and lomotil, zofran and compazine, ativan   Labs: glucose 101   Anthropometrics:   Height: 66 inches Weight: 206 lb 14.4 oz on 7/27 218 lb on 5/26 BMI: 33  6% weight loss in the last 2 months, concerning    NUTRITION DIAGNOSIS: Inadequate oral intake related to cancer related treatment side effects as evidenced by 6% weight loss and poor appetite   INTERVENTION:  Discussed strategies to help with nausea and diarrhea.  Nausea handout provided along with recipes for nausea.   Samples of ensure clear and boost soothe given to patient.  Encouraged small frequent meals/snack Contact information provided   MONITORING, EVALUATION, GOAL: weight trends, intake   Next Visit: August 17 th during infusion  Kristin Barcus B. Zenia Resides, [place], [place] Registered Dietitian [phone] (mobile)

## 2020-04-08 ENCOUNTER — Other Ambulatory Visit: Payer: Self-pay

## 2020-04-08 ENCOUNTER — Ambulatory Visit
Admission: RE | Admit: 2020-04-08 | Discharge: 2020-04-08 | Disposition: A | Discharge status: Home / Self Care | Payer: No Typology Code available for payment source | Source: Ambulatory Visit | Attending: Radiation Oncology | Admitting: Radiation Oncology

## 2020-04-08 DIAGNOSIS — Z5111 Encounter for antineoplastic chemotherapy: Secondary | ICD-10-CM | POA: Diagnosis not present

## 2020-04-09 ENCOUNTER — Other Ambulatory Visit: Payer: No Typology Code available for payment source

## 2020-04-09 ENCOUNTER — Telehealth: Payer: Self-pay | Admitting: Radiation Oncology

## 2020-04-09 ENCOUNTER — Ambulatory Visit: Payer: No Typology Code available for payment source

## 2020-04-09 ENCOUNTER — Encounter: Payer: No Typology Code available for payment source | Admitting: Genetic Counselor

## 2020-04-09 NOTE — Telephone Encounter (Signed)
Notified by staff the patient was around her son on Saturday and he tested positive for COVID today. Staff question if the patient should present for radiation therapy at 3 pm today. Phoned patient and advised her to get test for COVID asap and phone the cancer center back with her results. Advised her radiation therapy for 3 pm today has been cancelled. Patient verbalized understanding. Informed therapist on L4 of this finding.

## 2020-04-10 ENCOUNTER — Other Ambulatory Visit: Payer: Self-pay

## 2020-04-10 ENCOUNTER — Encounter (HOSPITAL_COMMUNITY): Payer: Self-pay

## 2020-04-10 ENCOUNTER — Ambulatory Visit: Payer: No Typology Code available for payment source

## 2020-04-10 DIAGNOSIS — Z853 Personal history of malignant neoplasm of breast: Secondary | ICD-10-CM | POA: Diagnosis not present

## 2020-04-10 DIAGNOSIS — Z8544 Personal history of malignant neoplasm of other female genital organs: Secondary | ICD-10-CM | POA: Diagnosis not present

## 2020-04-10 DIAGNOSIS — Z20822 Contact with and (suspected) exposure to covid-19: Secondary | ICD-10-CM | POA: Insufficient documentation

## 2020-04-10 DIAGNOSIS — R221 Localized swelling, mass and lump, neck: Secondary | ICD-10-CM | POA: Diagnosis not present

## 2020-04-10 DIAGNOSIS — I10 Essential (primary) hypertension: Secondary | ICD-10-CM | POA: Diagnosis not present

## 2020-04-10 DIAGNOSIS — Z79899 Other long term (current) drug therapy: Secondary | ICD-10-CM | POA: Diagnosis not present

## 2020-04-10 DIAGNOSIS — R112 Nausea with vomiting, unspecified: Secondary | ICD-10-CM | POA: Diagnosis not present

## 2020-04-10 DIAGNOSIS — R000 Tachycardia, unspecified: Secondary | ICD-10-CM | POA: Diagnosis not present

## 2020-04-10 DIAGNOSIS — F17210 Nicotine dependence, cigarettes, uncomplicated: Secondary | ICD-10-CM | POA: Diagnosis not present

## 2020-04-10 NOTE — ED Triage Notes (Signed)
Arrived POV from home. Patient reports temperature of 100.7 at home. Patient receives chemotherapy and was exposed to [place] by her son. Patient also reports sore throat. Patient took 1000 mg Tylenol at 1800 for fever.

## 2020-04-11 ENCOUNTER — Emergency Department (HOSPITAL_COMMUNITY)
Admission: EM | Admit: 2020-04-11 | Discharge: 2020-04-11 | Disposition: A | Discharge status: Home / Self Care | Payer: No Typology Code available for payment source | Attending: Emergency Medicine | Admitting: Emergency Medicine

## 2020-04-11 ENCOUNTER — Emergency Department (HOSPITAL_COMMUNITY): Payer: No Typology Code available for payment source

## 2020-04-11 DIAGNOSIS — E876 Hypokalemia: Secondary | ICD-10-CM

## 2020-04-11 DIAGNOSIS — I8290 Acute embolism and thrombosis of unspecified vein: Secondary | ICD-10-CM

## 2020-04-11 DIAGNOSIS — R112 Nausea with vomiting, unspecified: Secondary | ICD-10-CM

## 2020-04-11 LAB — CBC WITH DIFFERENTIAL/PLATELET
Abs Immature Granulocytes: 0.02 10*3/uL (ref 0.00–0.07)
Basophils Absolute: 0 10*3/uL (ref 0.0–0.1)
Basophils Relative: 0 %
Eosinophils Absolute: 0 10*3/uL (ref 0.0–0.5)
Eosinophils Relative: 0 %
HCT: 32.4 % — ABNORMAL LOW (ref 36.0–46.0)
Hemoglobin: 11.3 g/dL — ABNORMAL LOW (ref 12.0–15.0)
Immature Granulocytes: 1 %
Lymphocytes Relative: 17 %
Lymphs Abs: 0.6 10*3/uL — ABNORMAL LOW (ref 0.7–4.0)
MCH: 30 pg (ref 26.0–34.0)
MCHC: 34.9 g/dL (ref 30.0–36.0)
MCV: 85.9 fL (ref 80.0–100.0)
Monocytes Absolute: 0.2 10*3/uL (ref 0.1–1.0)
Monocytes Relative: 6 %
Neutro Abs: 2.7 10*3/uL (ref 1.7–7.7)
Neutrophils Relative %: 76 %
Platelets: 127 10*3/uL — ABNORMAL LOW (ref 150–400)
RBC: 3.77 MIL/uL — ABNORMAL LOW (ref 3.87–5.11)
RDW: 14.7 % (ref 11.5–15.5)
WBC: 3.5 10*3/uL — ABNORMAL LOW (ref 4.0–10.5)
nRBC: 0 % (ref 0.0–0.2)

## 2020-04-11 LAB — COMPREHENSIVE METABOLIC PANEL
ALT: 46 U/L — ABNORMAL HIGH (ref 0–44)
AST: 28 U/L (ref 15–41)
Albumin: 4 g/dL (ref 3.5–5.0)
Alkaline Phosphatase: 55 U/L (ref 38–126)
Anion gap: 12 (ref 5–15)
BUN: 7 mg/dL (ref 6–20)
CO2: 23 mmol/L (ref 22–32)
Calcium: 8.9 mg/dL (ref 8.9–10.3)
Chloride: 98 mmol/L (ref 98–111)
Creatinine, Ser: 0.58 mg/dL (ref 0.44–1.00)
GFR calc Af Amer: >60 mL/min (ref >60)
GFR calc non Af Amer: >60 mL/min (ref >60)
Glucose, Bld: 127 mg/dL — ABNORMAL HIGH (ref 70–99)
Potassium: 2.8 mmol/L — ABNORMAL LOW (ref 3.5–5.1)
Sodium: 133 mmol/L — ABNORMAL LOW (ref 135–145)
Total Bilirubin: 1 mg/dL (ref 0.3–1.2)
Total Protein: 7.3 g/dL (ref 6.5–8.1)

## 2020-04-11 LAB — URINALYSIS, ROUTINE W REFLEX MICROSCOPIC
Bilirubin Urine: NEGATIVE
Glucose, UA: NEGATIVE mg/dL
Ketones, ur: NEGATIVE mg/dL
Nitrite: NEGATIVE
Protein, ur: 30 mg/dL — AB
Specific Gravity, Urine: 1.033 — ABNORMAL HIGH (ref 1.005–1.030)
pH: 5 (ref 5.0–8.0)

## 2020-04-11 LAB — GROUP A STREP BY PCR: Group A Strep by PCR: NOT DETECTED

## 2020-04-11 LAB — SARS CORONAVIRUS 2 BY RT PCR (HOSPITAL ORDER, PERFORMED IN ~~LOC~~ HOSPITAL LAB): SARS Coronavirus 2: NEGATIVE

## 2020-04-11 LAB — LACTIC ACID, PLASMA: Lactic Acid, Venous: 1 mmol/L (ref 0.5–1.9)

## 2020-04-11 MED ORDER — IOHEXOL 300 MG/ML  SOLN
75.0000 mL | Freq: Once | INTRAMUSCULAR | Status: AC | PRN
  Administered 2020-04-11: 75 mL via INTRAVENOUS

## 2020-04-11 MED ORDER — ONDANSETRON HCL 4 MG/2ML IJ SOLN
4.0000 mg | Freq: Once | INTRAMUSCULAR | Status: AC
  Administered 2020-04-11: 4 mg via INTRAVENOUS
  Filled 2020-04-11: qty 2

## 2020-04-11 MED ORDER — RIVAROXABAN 15 MG PO TABS
15.0000 mg | ORAL_TABLET | Freq: Once | ORAL | Status: AC
  Administered 2020-04-11: 15 mg via ORAL
  Filled 2020-04-11: qty 1

## 2020-04-11 MED ORDER — SODIUM CHLORIDE 0.9 % IV BOLUS
1000.0000 mL | Freq: Once | INTRAVENOUS | Status: AC
  Administered 2020-04-11: 1000 mL via INTRAVENOUS

## 2020-04-11 MED ORDER — HEPARIN SOD (PORK) LOCK FLUSH 100 UNIT/ML IV SOLN
500.0000 [IU] | Freq: Once | INTRAVENOUS | Status: AC
  Administered 2020-04-11: 500 [IU]
  Filled 2020-04-11: qty 5

## 2020-04-11 MED ORDER — POTASSIUM CHLORIDE 10 MEQ/100ML IV SOLN
10.0000 meq | INTRAVENOUS | Status: AC
  Administered 2020-04-11 (×4): 10 meq via INTRAVENOUS
  Filled 2020-04-11 (×4): qty 100

## 2020-04-11 MED ORDER — PROCHLORPERAZINE EDISYLATE 10 MG/2ML IJ SOLN
10.0000 mg | Freq: Once | INTRAMUSCULAR | Status: AC
  Administered 2020-04-11: 10 mg via INTRAVENOUS
  Filled 2020-04-11: qty 2

## 2020-04-11 MED ORDER — RIVAROXABAN (XARELTO) VTE STARTER PACK (15 & 20 MG)
ORAL_TABLET | ORAL | 0 refills | Status: DC
Start: 2020-04-11 — End: 2020-05-07

## 2020-04-11 MED ORDER — RIVAROXABAN (XARELTO) EDUCATION KIT FOR DVT/PE PATIENTS
PACK | Freq: Once | Status: AC
  Filled 2020-04-11: qty 1

## 2020-04-11 MED ORDER — SODIUM CHLORIDE (PF) 0.9 % IJ SOLN
INTRAMUSCULAR | Status: AC
  Filled 2020-04-11: qty 50

## 2020-04-11 MED ORDER — ONDANSETRON 4 MG PO TBDP
4.0000 mg | ORAL_TABLET | Freq: Once | ORAL | Status: AC
  Administered 2020-04-11: 4 mg via ORAL
  Filled 2020-04-11: qty 1

## 2020-04-11 MED FILL — XARELTO STARTER PACK: 15 & 20 | 30 days supply | Qty: 51 | Fill #0

## 2020-04-11 NOTE — Discharge Instructions (Addendum)
Follow-up with your oncologist, on Tuesday morning to discuss the treatment for blood clot, with Xarelto.  Take the medications as directed.  Return here, if needed, for problems.  Try to eat foods which contain more potassium.  Take double dose of potassium, 1 pill twice a day, for 1 week.  See your doctor the following week for a potassium blood level check.      Information on my medicine - XARELTO (rivaroxaban)  WHY WAS XARELTO PRESCRIBED FOR YOU? Xarelto was prescribed to treat blood clots that may have been found in the veins of your legs (deep vein thrombosis) or in your lungs (pulmonary embolism) and to reduce the risk of them occurring again.  What do you need to know about Xarelto? The starting dose is one 15 mg tablet taken TWICE daily with food for the FIRST 21 DAYS then on (enter date)  05/02/20  the dose is changed to one 20 mg tablet taken ONCE A DAY with your evening meal.  DO NOT stop taking Xarelto without talking to the health care provider who prescribed the medication.  Refill your prescription for 20 mg tablets before you run out.  After discharge, you should have regular check-up appointments with your healthcare provider that is prescribing your Xarelto.  In the future your dose may need to be changed if your kidney function changes by a significant amount.  What do you do if you miss a dose? If you are taking Xarelto TWICE DAILY and you miss a dose, take it as soon as you remember. You may take two 15 mg tablets (total 30 mg) at the same time then resume your regularly scheduled 15 mg twice daily the next day.  If you are taking Xarelto ONCE DAILY and you miss a dose, take it as soon as you remember on the same day then continue your regularly scheduled once daily regimen the next day. Do not take two doses of Xarelto at the same time.   Important Safety Information Xarelto is a blood thinner medicine that can cause bleeding. You should call your healthcare  provider right away if you experience any of the following: Bleeding from an injury or your nose that does not stop. Unusual colored urine (red or dark brown) or unusual colored stools (red or black). Unusual bruising for unknown reasons. A serious fall or if you hit your head (even if there is no bleeding).  Some medicines may interact with Xarelto and might increase your risk of bleeding while on Xarelto. To help avoid this, consult your healthcare provider or pharmacist prior to using any new prescription or non-prescription medications, including herbals, vitamins, non-steroidal anti-inflammatory drugs (NSAIDs) and supplements.  This website has more information on Xarelto: [email or web address]

## 2020-04-11 NOTE — ED Notes (Signed)
Patient here for sore throat and fever for about 2 days, sent here by the cancer center.

## 2020-04-11 NOTE — ED Provider Notes (Signed)
[facility] DEPT Provider Note   CSN: [id number] Arrival date & time: 04/10/20  2235   Time seen 5:02 AM  History Chief Complaint  Patient presents with  . Fever    Sandra Miller is a 44 y.o. female.  HPI   Patient states over the weekend of July 31 her son had started feeling bad.  He had come to her house on the 31st but was wearing a mask.  He went to be tested on August 5 and he is positive for Covid.  Patient states she got her last chemotherapy on August 3.  She states on August 5 she started having right-sided throat pain, her highest fever was yesterday at 100.7, dry cough clear rhinorrhea nausea and vomiting which she typically gets after chemo and diarrhea which is typical after chemo.  She denies chest pain, shortness of breath, loss of taste or smell.  Patient has a history of vulvar cancer and left breast cancer.  She is getting neoadjuvant chemotherapy and radiation for vulvar cancer.  Patient is taking Decadron, Aloxi, carboplatin, Taxol, Emend, Kanjinti.  PCP Rutherford Guys, MD   Past Medical History:  Diagnosis Date  . Bipolar 1 disorder ([place])   . Cancer ([place])   . Complication of anesthesia    wakes up during procedures  . GAD (generalized anxiety disorder)   . Genital HSV    currently per pt  no break out 03-22-2016   . GERD (gastroesophageal reflux disease)   . Hiatal hernia   . History of cervical dysplasia    2012 laser ablation  . History of esophageal dilatation    for dysphasia -- x2 dilated  . History of gastric ulcer   . History of Helicobacter pylori infection    remote hx  . History of hidradenitis suppurativa    "gets all over body intermittantly"    . History of hypertension    no issue since stopped drinking alcohol 2014  . History of kidney stones   . History of panic attacks   . Iron deficiency anemia   . Left ureteral stone   . OCD (obsessive compulsive disorder)   . PTSD (post-traumatic stress  disorder)   . Recovering alcoholic in remission ([place])    since 2014  . RLS (restless legs syndrome)   . Smokers' cough ([place])   . Urgency of urination   . Yeast infection involving the vagina and surrounding area    secondary to taking antibiotic    Patient Active Problem List   Diagnosis Date Noted  . Port-A-Cath in place 03/11/2020  . Malignant neoplasm of upper-inner quadrant of left breast in female, estrogen receptor positive ([place]) 02/25/2020  . Vulvar cancer ([place]) 01/29/2020  . Vulvar ulceration 01/23/2020  . Essential hypertension 10/31/2019  . Nausea and vomiting 12/27/2018  . Seasonal allergic rhinitis 12/27/2018  . History of ELISA positive for HSV 10/01/2018  . Restless leg syndrome 04/01/2016  . Nephrolithiasis 03/08/2016  . Obstructive pyelonephritis 03/08/2016  . Normocytic anemia 03/08/2016  . Healthcare maintenance 12/24/2015  . Hidradenitis suppurativa of left axilla 11/03/2015  . Loss of weight 09/08/2015  . Neck pain on left side 10/22/2014  . Genital warts 05/20/2014  . GERD (gastroesophageal reflux disease) 12/26/2012  . Functional dyspepsia 12/26/2012  . Tobacco abuse 08/22/2012  . H/O: HTN (hypertension) 03/19/2009  . Herpes simplex virus (HSV) infection 02/03/2007  . DISORDER, BIPOLAR NOS 02/03/2007    Past Surgical History:  Procedure Laterality Date  .  CESAREAN SECTION  1995   w/  Bilateral Tubal Ligation  . COLONOSCOPY  last one 08-09-2013  . CYSTOSCOPY W/ URETERAL STENT PLACEMENT Left 03/29/2016   Procedure: CYSTOSCOPY WITH STENT REPLACEMENT;  Surgeon: Nickie Retort, MD;  Location: [facility];  Service: Urology;  Laterality: Left;  . CYSTOSCOPY WITH RETROGRADE PYELOGRAM, URETEROSCOPY AND STENT PLACEMENT Left 03/08/2016   Procedure: CYSTOSCOPY WITH  LEFT RETROGRADE PYELOGRAM, AND STENT PLACEMENT;  Surgeon: Nickie Retort, MD;  Location: [facility];  Service: Urology;  Laterality: Left;  .  CYSTOSCOPY/RETROGRADE/URETEROSCOPY/STONE EXTRACTION WITH BASKET Left 03/29/2016   Procedure: CYSTOSCOPY/RETROGRADE/URETEROSCOPY/STONE EXTRACTION WITH BASKET;  Surgeon: Nickie Retort, MD;  Location: [facility];  Service: Urology;  Laterality: Left;  . ENDOMETRIAL ABLATION W/ NOVASURE  04-01-2010  . ESOPHAGOGASTRODUODENOSCOPY  last one 08-09-2013  . KNEE ARTHROSCOPY Left as teen  . LASER ABLATION OF THE CERVIX  2012 approx  . PORTACATH PLACEMENT Right 03/06/2020   Procedure: INSERTION PORT-A-CATH WITH ULTRASOUND GUIDANCE;  Surgeon: Alphonsa Overall, MD;  Location: [facility];  Service: General;  Laterality: Right;  . TRANSTHORACIC ECHOCARDIOGRAM  05-19-2006   lvsf normal, ef 55-65%, there was mild flattening of the interventricular septum during diastoli/  RV size at upper limits normal  . TUBAL LIGATION    . WISDOM TOOTH EXTRACTION  age 71 's     OB History    Gravida  2   Para  2   Term  2   Preterm      AB      Living  2     SAB      TAB      Ectopic      Multiple      Live Births  2           Family History  Problem Relation Age of Onset  . Heart disease Father   . Lung cancer Father   . Alcohol abuse Father   . Heart disease Mother   . Depression Mother   . Anxiety disorder Mother   . Drug abuse Brother   . Alcohol abuse Brother   . Drug abuse Brother   . ADD / ADHD Brother   . Colon polyps Brother   . Stomach cancer Paternal Grandfather   . Diabetes Maternal Grandfather   . Diabetes Paternal Grandmother   . Kidney disease Maternal Uncle   . Cirrhosis Cousin        alcoholic  . Anxiety disorder Maternal Aunt   . Depression Maternal Aunt   . Cancer Cousin        cervical cancer    Social History   Tobacco Use  . Smoking status: Current Every Day Smoker    Packs/day: 0.50    Years: 22.00    Pack years: 11.00    Types: Cigarettes  . Smokeless tobacco: Never Used  . Tobacco comment: Recently started a smoking  cessation class.   Vaping Use  . Vaping Use: Never used  Substance Use Topics  . Alcohol use: Not Currently    Alcohol/week: 0.0 standard drinks    Comment:  hx alcohollism  in remission since 2014  . Drug use: No    Comment: hx  marijuana use    Home Medications Prior to Admission medications   Medication Sig Start Date End Date Taking? Authorizing Provider  acetaminophen (TYLENOL) 500 MG tablet Take 1,000 mg by mouth every 6 (six) hours as needed for moderate pain or  fever.    [provider]  dexamethasone (DECADRON) 4 MG tablet Take 1 tablet (4 mg total) by mouth daily. Start the day after chemotherapy for 2 days. 02/27/20   Nicholas Lose, MD  diphenoxylate-atropine (LOMOTIL) 2.5-0.025 MG tablet 1 to 2 PO QID prn diarrhea 03/24/20   Tanner, Lyndon Code., PA-C  fluconazole (DIFLUCAN) 150 MG tablet Take 1 tablet (150 mg total) by mouth daily. Take 2nd tablet 72 hours later 04/02/20   Gery Pray, MD  gabapentin (NEURONTIN) 300 MG capsule Take 1 capsule (300 mg total) by mouth daily. 01/08/20   Gatha Mayer, MD  HYDROcodone-acetaminophen (NORCO/VICODIN) 5-325 MG tablet Take 1 tablet by mouth every 6 (six) hours as needed for moderate pain. 03/06/20   Alphonsa Overall, MD  lidocaine (XYLOCAINE) 5 % ointment Apply 1 application topically 3 (three) times daily as needed. To affected area 04/02/20   Gery Pray, MD  lidocaine-prilocaine (EMLA) cream Apply to affected area once 02/27/20   Nicholas Lose, MD  lisinopril-hydrochlorothiazide (ZESTORETIC) 20-12.5 MG tablet Take 1 tablet by mouth daily. 10/31/19   Rutherford Guys, MD  LORazepam (ATIVAN) 1 MG tablet Take 1 tablet (1 mg total) by mouth 3 (three) times daily as needed for anxiety. 03/26/20   Arfeen, Arlyce Harman, MD  Multiple Vitamin (MULTIVITAMIN) tablet Take 1 tablet by mouth daily.    [provider]  nicotine (NICODERM CQ - DOSED IN MG/24 HOURS) 21 mg/24hr patch Place 1 patch (21 mg total) onto the skin daily. 01/29/20   Everitt Amber,  MD  OLANZapine (ZYPREXA) 15 MG tablet Take 1 tablet (15 mg total) by mouth at bedtime. 03/26/20 03/26/21  Arfeen, Arlyce Harman, MD  omeprazole (PRILOSEC) 40 MG capsule TAKE 1 CAPSULE(40 MG) BY MOUTH TWICE DAILY BEFORE A MEAL 01/08/20   Gatha Mayer, MD  ondansetron (ZOFRAN) 8 MG tablet Take 1 tablet (8 mg total) by mouth 2 (two) times daily as needed for refractory nausea / vomiting. Start on day 3 after chemo. 02/27/20   Nicholas Lose, MD  potassium chloride SA (KLOR-CON) 20 MEQ tablet Take 1 tablet (20 mEq total) by mouth daily. 03/11/20   Nicholas Lose, MD  Probiotic Product (PROBIOTIC DAILY PO) Take by mouth.    [provider]  prochlorperazine (COMPAZINE) 10 MG tablet Take 1 tablet (10 mg total) by mouth every 6 (six) hours as needed (Nausea or vomiting). 02/27/20   Nicholas Lose, MD  topiramate (TOPAMAX) 25 MG tablet TAKE 1 TABLET BY MOUTH AT BEDTIME 04/02/20   Arfeen, Arlyce Harman, MD  traZODone (DESYREL) 100 MG tablet Take 1 tablet (100 mg total) by mouth at bedtime. 03/26/20   Arfeen, Arlyce Harman, MD  venlafaxine XR (EFFEXOR XR) 37.5 MG 24 hr capsule Take three capsule (112.5 mg total) by mouth daily. 03/26/20   Kathlee Nations, MD    Allergies    Depakote [divalproex sodium], Minocycline, and Aspirin  Review of Systems   Review of Systems  All other systems reviewed and are negative.   Physical Exam Updated Vital Signs BP 108/63 (BP Location: Left Arm)   Pulse (!) 112   Temp 99.5 F (37.5 C) (Oral)   Resp 19   Ht 5\' 6"  (1.676 m)   Wt 93.4 kg   SpO2 99%   BMI 33.25 kg/m   Physical Exam Vitals and nursing note reviewed.  Constitutional:      Appearance: Normal appearance. She is normal weight. She is not toxic-appearing.  HENT:     Head:  Normocephalic and atraumatic.     Right Ear: External ear normal.     Left Ear: External ear normal.     Mouth/Throat:     Pharynx: Uvula midline. No pharyngeal swelling, oropharyngeal exudate, posterior oropharyngeal erythema or uvula swelling.      Comments: Voice is normal, she has no trouble handling her secretions. Eyes:     Extraocular Movements: Extraocular movements intact.     Conjunctiva/sclera: Conjunctivae normal.     Pupils: Pupils are equal, round, and reactive to light.  Cardiovascular:     Rate and Rhythm: Regular rhythm. Tachycardia present.     Pulses: Normal pulses.     Heart sounds: Normal heart sounds. No murmur heard.   Pulmonary:     Effort: Pulmonary effort is normal. No respiratory distress.     Breath sounds: Normal breath sounds.  Musculoskeletal:        General: Normal range of motion.     Cervical back: Normal range of motion. Tenderness present.  Skin:    General: Skin is warm and dry.  Neurological:     General: No focal deficit present.     Mental Status: She is alert and oriented to person, place, and time.     Cranial Nerves: No cranial nerve deficit.  Psychiatric:        Mood and Affect: Mood normal.        Behavior: Behavior normal.        Thought Content: Thought content normal.        Judgment: Judgment normal.     ED Results / Procedures / Treatments   Labs (all labs ordered are listed, but only abnormal results are displayed) Results for orders placed or performed during the hospital encounter of 04/11/20  SARS Coronavirus 2 by RT PCR (hospital order, performed in [facility] hospital lab) Nasopharyngeal Nasopharyngeal Swab   Specimen: Nasopharyngeal Swab  Result Value Ref Range   SARS Coronavirus 2 NEGATIVE NEGATIVE  Group A Strep by PCR   Specimen: Throat; Sterile Swab  Result Value Ref Range   Group A Strep by PCR NOT DETECTED NOT DETECTED  CBC with Differential  Result Value Ref Range   WBC 3.5 (L) 4.0 - 10.5 K/uL   RBC 3.77 (L) 3.87 - 5.11 MIL/uL   Hemoglobin 11.3 (L) 12.0 - 15.0 g/dL   HCT 32.4 (L) 36 - 46 %   MCV 85.9 80.0 - 100.0 fL   MCH 30.0 26.0 - 34.0 pg   MCHC 34.9 30.0 - 36.0 g/dL   RDW 14.7 11.5 - 15.5 %   Platelets 127 (L) 150 - 400 K/uL   nRBC 0.0 0.0 -  0.2 %   Neutrophils Relative % 76 %   Neutro Abs 2.7 1.7 - 7.7 K/uL   Lymphocytes Relative 17 %   Lymphs Abs 0.6 (L) 0.7 - 4.0 K/uL   Monocytes Relative 6 %   Monocytes Absolute 0.2 0 - 1 K/uL   Eosinophils Relative 0 %   Eosinophils Absolute 0.0 0 - 0 K/uL   Basophils Relative 0 %   Basophils Absolute 0.0 0 - 0 K/uL   Immature Granulocytes 1 %   Abs Immature Granulocytes 0.02 0.00 - 0.07 K/uL  Comprehensive metabolic panel  Result Value Ref Range   Sodium 133 (L) 135 - 145 mmol/L   Potassium 2.8 (L) 3.5 - 5.1 mmol/L   Chloride 98 98 - 111 mmol/L   CO2 23 22 - 32 mmol/L   Glucose, Bld  127 (H) 70 - 99 mg/dL   BUN 7 6 - 20 mg/dL   Creatinine, Ser 0.58 0.44 - 1.00 mg/dL   Calcium 8.9 8.9 - 10.3 mg/dL   Total Protein 7.3 6.5 - 8.1 g/dL   Albumin 4.0 3.5 - 5.0 g/dL   AST 28 15 - 41 U/L   ALT 46 (H) 0 - 44 U/L   Alkaline Phosphatase 55 38 - 126 U/L   Total Bilirubin 1.0 0.3 - 1.2 mg/dL   GFR calc non Af Amer >60 >60 mL/min   GFR calc Af Amer >60 >60 mL/min   Anion gap 12 5 - 15  Lactic acid, plasma  Result Value Ref Range   Lactic Acid, Venous 1.0 0.5 - 1.9 mmol/L   Laboratory interpretation all normal except mild low white blood cell count, hypokalemia, hyponatremia I guess I can give her potassium and that poor does makes me a little bit nervous    EKG None  Radiology DG Chest 2 View  Result Date: 04/11/2020 CLINICAL DATA:  Fever chemo EXAM: CHEST - 2 VIEW COMPARISON:  March 06, 2020 FINDINGS: The heart size and mediastinal contours are within normal limits. A right-sided MediPort catheter seen with the tip at the superior cavoatrial junction. Both lungs are clear. The visualized skeletal structures are unremarkable. IMPRESSION: No active cardiopulmonary disease. Electronically Signed   By: Prudencio Pair M.D.   On: 04/11/2020 03:56    Procedures Procedures (including critical care time)  Medications Ordered in ED Medications  sodium chloride 0.9 % bolus 1,000 mL (has no  administration in time range)  ondansetron (ZOFRAN) injection 4 mg (has no administration in time range)  potassium chloride 10 mEq in 100 mL IVPB (has no administration in time range)  ondansetron (ZOFRAN-ODT) disintegrating tablet 4 mg (4 mg Oral Given 04/11/20 0210)  prochlorperazine (COMPAZINE) injection 10 mg (10 mg Intravenous Given 04/11/20 0426)  sodium chloride 0.9 % bolus 1,000 mL (1,000 mLs Intravenous New Bag/Given 04/11/20 6301)    ED Course  I have reviewed the triage vital signs and the nursing notes.  Pertinent labs & imaging results that were available during my care of the patient were reviewed by me and considered in my medical decision making (see chart for details).    MDM Rules/Calculators/A&P                           Patient was given IV Compazine and Zofran ODT for her complaints of nausea.  She states she had vomited prior to coming to the ED.  She was given IV fluids.  She states she still too nauseated to take the potassium pills and states she normally takes them every day but she did not take them today.  Rapid strep was done, Covid testing is still pending.  We discussed her chest x-ray was normal.  7:00 AM we discussed that her rapid strep and Covid are both negative.  When we discussed further she indicates that her pain in her neck is actually more in the right side near the anterior border of the distal sternocleidomastoid muscle near where her port tube enters her neck.  She does not actually have pain up high over the tonsillar area.  CT of the neck was ordered.  Patient continues to have nausea and vomiting and has some fluid in her emesis bag.  She had already been given Zofran 8 mg ODT and IV Compazine, she was given an additional  4 mg of Zofran.  Pt turned over to Dr Eulis Foster at change of shift.   Final Clinical Impression(s) / ED Diagnoses Final diagnoses:  Nausea and vomiting, intractability of vomiting not specified, unspecified vomiting type  Mass of  right side of neck    Rx / DC Orders  Disposition pending  Rolland Porter, MD, Barbette Or, MD 04/11/20 [phone]

## 2020-04-11 NOTE — ED Provider Notes (Signed)
8:05 AM-received call from radiology regarding neck CT.  Discussed with radiologist, findings indicate right IJ thrombus, likely source of pain and swelling seen on examination.  This is most likely secondary to her vascular port, and history of cancer.  Patient has noticed pain and swelling at the site for several days which is not worsening.  At this time patient is receiving infusions, saline and potassium.  She is comfortable.  She has developed some vomiting since arriving in the ED, that is better now after treatment.  8:35 AM-case discussed with oncology, Dr. Alen Blew, he recommends outpatient treatment for DVT with Xarelto and follow-up with her regular oncologist.  Patient is informed and agreeable.  Will initiate treatment now.   11:15 AM-she is now completing her fourth round of potassium as treatment for hypokalemia.  She has received Xarelto for DVT treatment.  She has been counseled by pharmacist for use of anticoagulants for treatment of DVT.  Patient requests more Zofran, for nausea, but has not vomited for couple of hours.  She has been tolerating oral liquids and some crackers.  Findings have been discussed with the patient she has an appointment for radiation in 2 days, and chemotherapy, in 3 days.  She will be able to follow-up with oncology regarding the DVT, at the time of chemotherapy.  Hyperkalemia is likely incidental, and associated with use of hydrochlorothiazide, as treatment for blood pressure.  She is currently taking home potassium and will be advised to double up on it for a couple of days.   Daleen Bo, MD 04/11/20 [phone]

## 2020-04-12 LAB — URINE CULTURE: Culture: 20000 — AB

## 2020-04-13 ENCOUNTER — Ambulatory Visit
Admission: RE | Admit: 2020-04-13 | Discharge: 2020-04-13 | Disposition: A | Discharge status: Home / Self Care | Payer: No Typology Code available for payment source | Source: Ambulatory Visit | Attending: Radiation Oncology | Admitting: Radiation Oncology

## 2020-04-13 ENCOUNTER — Other Ambulatory Visit: Payer: Self-pay

## 2020-04-13 ENCOUNTER — Telehealth: Payer: Self-pay

## 2020-04-13 DIAGNOSIS — Z5111 Encounter for antineoplastic chemotherapy: Secondary | ICD-10-CM | POA: Diagnosis not present

## 2020-04-13 NOTE — Progress Notes (Signed)
ED Antimicrobial Stewardship Positive Culture Follow Up   Sandra Miller is an 44 y.o. female who presented to [facility] on 04/11/2020 with a chief complaint of fever, right throat pain, and vomiting.  Chief Complaint  Patient presents with   Fever    Recent Results (from the past 720 hour(s))  Blood culture (routine x 2)     Status: None (Preliminary result)   Collection Time: 04/11/20  3:21 AM   Specimen: BLOOD  Result Value Ref Range Status   Specimen Description   Final    BLOOD RIGHT HAND Performed at [facility], [place] [address], [place], [place] [address]    Special Requests   Final    BOTTLES DRAWN AEROBIC AND ANAEROBIC Blood Culture adequate volume Performed at [facility] [address] [place], [place] [address]    Culture   Final    NO GROWTH 2 DAYS Performed at [facility] [address], [place], [place] [address]    Report Status PENDING  Incomplete  SARS Coronavirus 2 by RT PCR (hospital order, performed in [facility] hospital lab) Nasopharyngeal Nasopharyngeal Swab     Status: None   Collection Time: 04/11/20  3:21 AM   Specimen: Nasopharyngeal Swab  Result Value Ref Range Status   SARS Coronavirus 2 NEGATIVE NEGATIVE Final    Comment: (NOTE) SARS-CoV-2 target nucleic acids are NOT DETECTED.  The SARS-CoV-2 RNA is generally detectable in upper and lower respiratory specimens during the acute phase of infection. The lowest concentration of SARS-CoV-2 viral copies this assay can detect is 250 copies / mL. A negative result does not preclude SARS-CoV-2 infection and should not be used as the sole basis for treatment or other patient management decisions.  A negative result may occur with improper specimen collection / handling, submission of specimen other than nasopharyngeal swab, presence of viral mutation(s) within the areas targeted by this assay, and inadequate number of viral copies (<250 copies / mL).  A negative result must be combined with clinical observations, patient history, and epidemiological information.  Fact Sheet for Patients:   StrictlyIdeas.no  Fact Sheet for Healthcare Providers: BankingDealers.co.za  This test is not yet approved or  cleared by the [place] FDA and has been authorized for detection and/or diagnosis of SARS-CoV-2 by FDA under an Emergency Use Authorization (EUA).  This EUA will remain in effect (meaning this test can be used) for the duration of the COVID-19 declaration under Section 564(b)(1) of the Act, 21 U.S.C. section 360bbb-3(b)(1), unless the authorization is terminated or revoked sooner.  Performed at [facility], [place] [address], [place], [place] [address]   Blood culture (routine x 2)     Status: None (Preliminary result)   Collection Time: 04/11/20  3:26 AM   Specimen: BLOOD  Result Value Ref Range Status   Specimen Description   Final    BLOOD LEFT HAND Performed at [facility] [address] [place], [place] [address]    Special Requests   Final    BOTTLES DRAWN AEROBIC AND ANAEROBIC Blood Culture adequate volume Performed at [facility] [address] [place], [place] [address]    Culture   Final    NO GROWTH 2 DAYS Performed at [facility] [address], [place], [place] [address]    Report Status PENDING  Incomplete  Group A Strep by PCR     Status: None   Collection  Time: 04/11/20  5:14 AM   Specimen: Throat; Sterile Swab  Result Value Ref Range Status   Group A Strep by PCR NOT DETECTED NOT DETECTED Final    Comment: Performed at [facility], [place] [address], [place], [place] [address]  Urine culture     Status: Abnormal   Collection Time: 04/11/20  8:34 AM   Specimen: Urine, Random  Result Value Ref Range Status   Specimen Description   Final    URINE, RANDOM Performed at  [facility] [address] [place], [place] [address]    Special Requests   Final    NONE Performed at [facility], [place] [address], [place], [place] [address]    Culture (A)  Final    20,000 COLONIES/mL GROUP B STREP(S.AGALACTIAE)ISOLATED TESTING AGAINST S. AGALACTIAE NOT ROUTINELY PERFORMED DUE TO PREDICTABILITY OF AMP/PEN/VAN SUSCEPTIBILITY. Performed at [facility], [place] [address] [place], [place] [address]    Report Status 04/12/2020 FINAL  Final   Plan: No treatment is indicated at this time.   ED Provider: Vito Backers, PA-C   Lynelle Doctor 04/13/2020, 10:33 AM Clinical Pharmacist [phone]

## 2020-04-13 NOTE — Telephone Encounter (Signed)
Called to check on pt her AccessNurse note regarding fever/sore throat/exposure to COVID. Based on labs from ED and ED notes, pt was negative for COVID. Ot states it is not her throat that hurts; it is her neck causing the pain from blood clot in IJ. Pt requests pain medication, and states she has not been checking her temperature; however, is having night sweats. Advised pt to check temperature. Please advise about pain medication. Thank you.

## 2020-04-13 NOTE — Telephone Encounter (Signed)
Post ED Visit - Positive Culture Follow-up  Culture report reviewed by antimicrobial stewardship pharmacist: [facility] Team []  Elenor Quinones, Pharm.D. []  Heide Guile, Pharm.D., BCPS AQ-ID []  Parks Neptune, Pharm.D., BCPS []  Alycia Rossetti, Pharm.D., BCPS []  [place], Pharm.D., BCPS, AAHIVP []  Legrand Como, Pharm.D., BCPS, AAHIVP []  Salome Arnt, PharmD, BCPS []  Johnnette Gourd, PharmD, BCPS []  Hughes Better, PharmD, BCPS []  Leeroy Cha, PharmD []  Laqueta Linden, PharmD, BCPS []  Albertina Parr, PharmD  [facility] Team []  Leodis Sias, PharmD []  Lindell Spar, PharmD []  Royetta Asal, PharmD []  Graylin Shiver, Rph []  Rema Fendt) Glennon Mac, PharmD []  Arlyn Dunning, PharmD []  Netta Cedars, PharmD [x]  Dia Sitter, PharmD []  Leone Haven, PharmD []  Gretta Arab, PharmD []  Theodis Shove, PharmD []  Peggyann Juba, PharmD []  Reuel Boom, PharmD   Positive urine culture, strep urine culture 20,000 colonies  Reviewed with Rudi Rummage PA in ED, asymptomatic,  no further patient follow-up is required at this time.  Hazle Nordmann 04/13/2020, 12:12 PM

## 2020-04-13 NOTE — Progress Notes (Signed)
Patient Care Team: Rutherford Guys, MD as PCP - General (Family Medicine) Mauro Kaufmann, RN as Oncology Nurse Navigator Rockwell Germany, RN as Oncology Nurse Navigator Nicholas Lose, MD as Consulting Physician (Hematology and Oncology) Alphonsa Overall, MD as Consulting Physician (General Surgery) Awanda Mink Craige Cotta, RN as Oncology Nurse Navigator (Oncology)  DIAGNOSIS:    ICD-10-CM   1. Vulvar cancer ([employer])  C51.9     SUMMARY OF ONCOLOGIC HISTORY: Oncology History  Vulvar cancer ([place])  01/29/2020 Initial Diagnosis   Vulvar cancer ([place])   03/11/2020 -  Chemotherapy   The patient had dexamethasone (DECADRON) 4 MG tablet, 4 mg (100 % of original dose 4 mg), Oral, Daily, 1 of 1 cycle, Start date: 02/27/2020, End date: -- Dose modification: 4 mg (original dose 4 mg, Cycle 0) palonosetron (ALOXI) injection 0.25 mg, 0.25 mg, Intravenous,  Once, 1 of 1 cycle Administration: 0.25 mg (03/11/2020), 0.25 mg (03/17/2020), 0.25 mg (03/24/2020), 0.25 mg (03/31/2020), 0.25 mg (04/07/2020), 0.25 mg (04/14/2020) CARBOplatin (PARAPLATIN) 300 mg in sodium chloride 0.9 % 250 mL chemo infusion, 300 mg (100 % of original dose 300 mg), Intravenous,  Once, 1 of 1 cycle Dose modification:   (original dose 300 mg, Cycle 1) Administration: 300 mg (03/11/2020), 290 mg (03/17/2020), 300 mg (03/24/2020), 230 mg (03/31/2020), 230 mg (04/07/2020), 230 mg (04/14/2020) PACLitaxel (TAXOL) 108 mg in sodium chloride 0.9 % 250 mL chemo infusion (</= 22m/m2), 50 mg/m2 = 108 mg, Intravenous,  Once, 1 of 1 cycle Dose modification: 45 mg/m2 (original dose 50 mg/m2, Cycle 1, Reason: Dose not tolerated) Administration: 108 mg (03/11/2020), 108 mg (03/17/2020), 108 mg (03/24/2020), 96 mg (03/31/2020), 96 mg (04/07/2020), 96 mg (04/14/2020) fosaprepitant (EMEND) 150 mg in sodium chloride 0.9 % 145 mL IVPB, 150 mg, Intravenous,  Once, 1 of 1 cycle Administration: 150 mg (03/24/2020), 150 mg (03/31/2020), 150 mg (04/07/2020), 150 mg (04/14/2020) trastuzumab-anns  (KANJINTI) 378 mg in sodium chloride 0.9 % 250 mL chemo infusion, 4 mg/kg = 378 mg (100 % of original dose 4 mg/kg), Intravenous,  Once, 1 of 1 cycle Dose modification: 4 mg/kg (original dose 4 mg/kg, Cycle 1, Reason: Other (see comments), Comment: loading dose) Administration: 378 mg (03/11/2020), 189 mg (03/17/2020), 189 mg (03/24/2020), 189 mg (03/31/2020), 189 mg (04/07/2020), 189 mg (04/14/2020)  for chemotherapy treatment.    Malignant neoplasm of upper-inner quadrant of left breast in female, estrogen receptor positive ([place]  02/20/2020 Initial Diagnosis   PET scan on 02/07/20 following a diagnosis of vulvar cancer showed a 1.7cm left breast mass and mildly hypermetabolic left axillary lymph nodes. [place] showed a 3.1cm left breast mass at the 11 o'clock position, palpable on exam, and a single abnormal left axillary lymph node with cortical thickening. Left breast biopsy on 02/20/20 showed invasive and in situ carcinoma in the breast and axilla, grade 3, HER-2 positive (3+), ER/PR+ 95%, Ki67 75%   02/25/2020 Cancer Staging   Staging form: Breast, AJCC 8th Edition - Clinical stage from 02/25/2020: Stage IB (cT2, cN1, cM0, G3, ER+, PR+, HER2+) - Signed by GNicholas Lose MD on 02/25/2020     CHIEF COMPLIANT: Cycle 6 Taxol, Carboplatin, and Herceptin  INTERVAL HISTORY: Verdene C Fobes is a 44y.o. with above-mentioned history of left breast cancer and vulvar cancercurrently on neoadjuvant chemotherapy treatment with weekly Taxol, Carboplatin, and Herceptin. She was seen in the ED on 04/11/20 for nausea and an exposure to her COVID-19 positive son, but she was negative for COVID-19.  She presents to the clinic todayfor cycle 6.  Patient is complaining of performed vaginal irritability and profound diarrhea 4-5 times per day.  She has taken Lomotil but is not fully helping her.  She also has profound fatigue.  She also complains of nausea because of which is not able to eat a whole lot of  food.  Food does not taste good either.  She ran out of potassium supplement.  ALLERGIES:  is allergic to depakote [divalproex sodium], minocycline, and aspirin.  MEDICATIONS:  Current Outpatient Medications  Medication Sig Dispense Refill  . acetaminophen (TYLENOL) 500 MG tablet Take 1,000 mg by mouth every 6 (six) hours as needed for moderate pain or fever.    Marland Kitchen dexamethasone (DECADRON) 4 MG tablet Take 1 tablet (4 mg total) by mouth daily. Start the day after chemotherapy for 2 days. (Patient not taking: Reported on 04/11/2020) 30 tablet 0  . diphenoxylate-atropine (LOMOTIL) 2.5-0.025 MG tablet 1 to 2 PO QID prn diarrhea (Patient taking differently: Take 1-2 tablets by mouth 4 (four) times daily as needed for diarrhea or loose stools. ) 45 tablet 1  . gabapentin (NEURONTIN) 300 MG capsule Take 1 capsule (300 mg total) by mouth daily. 90 capsule 3  . lidocaine (XYLOCAINE) 5 % ointment Apply 1 application topically 3 (three) times daily as needed. To affected area 35.44 g 0  . lidocaine-prilocaine (EMLA) cream Apply 1 application topically as needed (to port).    Marland Kitchen lisinopril-hydrochlorothiazide (ZESTORETIC) 20-12.5 MG tablet Take 1 tablet by mouth daily. 90 tablet 1  . LORazepam (ATIVAN) 1 MG tablet Take 1 tablet (1 mg total) by mouth 3 (three) times daily as needed for anxiety. 90 tablet 2  . Multiple Vitamin (MULTIVITAMIN) tablet Take 1 tablet by mouth daily.    Marland Kitchen OLANZapine (ZYPREXA) 15 MG tablet Take 1 tablet (15 mg total) by mouth at bedtime. 90 tablet 0  . omeprazole (PRILOSEC) 40 MG capsule TAKE 1 CAPSULE(40 MG) BY MOUTH TWICE DAILY BEFORE A MEAL (Patient taking differently: Take 40 mg by mouth 2 (two) times daily with a meal. \) 180 capsule 0  . ondansetron (ZOFRAN) 8 MG tablet Take 1 tablet (8 mg total) by mouth 2 (two) times daily as needed for refractory nausea / vomiting. Start on day 3 after chemo. 30 tablet 1  . potassium chloride SA (KLOR-CON) 20 MEQ tablet Take 1 tablet (20 mEq  total) by mouth daily. 60 tablet 2  . Probiotic Product (PROBIOTIC DAILY PO) Take 1 tablet by mouth daily.     . prochlorperazine (COMPAZINE) 10 MG tablet Take 1 tablet (10 mg total) by mouth every 6 (six) hours as needed (Nausea or vomiting). 30 tablet 1  . RIVAROXABAN (XARELTO) VTE STARTER PACK (15 & 20 MG TABLETS) Follow package directions: Take one 56m tablet by mouth twice a day. On day 22, switch to one 229mtablet once a day. Take with food. 51 each 0  . topiramate (TOPAMAX) 25 MG tablet TAKE 1 TABLET BY MOUTH AT BEDTIME (Patient taking differently: Take 25 mg by mouth at bedtime. ) 90 tablet 0  . traZODone (DESYREL) 100 MG tablet Take 1 tablet (100 mg total) by mouth at bedtime. 90 tablet 0  . venlafaxine XR (EFFEXOR XR) 37.5 MG 24 hr capsule Take three capsule (112.5 mg total) by mouth daily. 90 capsule 2   No current facility-administered medications for this visit.   Facility-Administered Medications Ordered in Other Visits  Medication Dose Route Frequency Provider Last Rate Last  Admin  . sodium chloride flush (NS) 0.9 % injection 10 mL  10 mL Intracatheter PRN Nicholas Lose, MD   10 mL at 04/14/20 1556    PHYSICAL EXAMINATION: ECOG PERFORMANCE STATUS: 1 - Symptomatic but completely ambulatory  Vitals:   04/14/20 0856  BP: 109/61  Pulse: 100  Resp: 18  Temp: 98.7 F (37.1 C)  SpO2: 100%   Filed Weights   04/14/20 0856  Weight: 199 lb 12.8 oz (90.6 kg)    LABORATORY DATA:  I have reviewed the data as listed CMP Latest Ref Rng & Units 04/14/2020 04/11/2020 04/07/2020  Glucose 70 - 99 mg/dL 106(H) 127(H) 101(H)  BUN 6 - 20 mg/dL _0 Creatinine 0.44 - 1.00 mg/dL 0.73 0.58 0.72  Sodium 135 - 145 mmol/L 136 133(L) 138  Potassium 3.5 - 5.1 mmol/L 2.6(LL) 2.8(L) 3.5  Chloride 98 - 111 mmol/L 102 98 110  CO2 22 - 32 mmol/L 22 23 20(L)  Calcium 8.9 - 10.3 mg/dL 9.1 8.9 9.2  Total Protein 6.5 - 8.1 g/dL 7.3 7.3 6.9  Total Bilirubin 0.3 - 1.2 mg/dL 0.3 1.0 0.3  Alkaline  Phos 38 - 126 U/L 55 55 55  AST 15 - 41 U/L _1 ALT 0 - 44 U/L 40 46(H) 45(H)    Lab Results  Component Value Date   WBC 3.8 (L) 04/14/2020   HGB 9.7 (L) 04/14/2020   HCT 27.5 (L) 04/14/2020   MCV 84.6 04/14/2020   PLT 126 (L) 04/14/2020   NEUTROABS 2.4 04/14/2020    ASSESSMENT & PLAN:  Vulvar cancer ([place]) Breast cancer and vulvar cancers  02/20/2020:PET scan on 02/07/20 following a diagnosis of vulvar cancer showed a 1.7cm left breast mass and mildly hypermetabolic left axillary lymph nodes. Mammogram and [place] on 02/18/20 showed a 3.1cm left breast mass at the 11 o'clock position, palpable on exam, and a single abnormal left axillary lymph node with cortical thickening. Left breast biopsy on 02/20/20 showed invasive and in situ carcinoma in the breast and axilla, grade 3, HER-2 positive (3+), ER/PR+ 95%, Ki67 75%  Treatment plan: 1.Neoadjuvant chemoradiation for vulvar cancer with Taxol, carboplatin, Herceptin weekly x6 cycles 2.neoadjuvant chemotherapy with TCH Perjeta x3 cycles 3.Evaluation for breast conserving surgery if possible with sentinel lymph node study 3. Followed by adjuvant radiation therapy if patient had lumpectomy ------------------------------------------------------------------------------------------------------------------------------- Current treatment: Cycle6Taxol carboplatin Herceptin, weekly with radiation Labs reviewed Echocardiogram: 02/23/2020: EF 55 to 60% Chemo toxicities: Nausea: In spite of multiple antinausea medications.  Leukopenia Diarrhea: Currently on Lomotil and Imodium.  I instructed her to schedule her Lomotil 3 times a day and take Imodium as needed.  Monitoring closely for toxicities  Return to clinicweekly for chemotherapy treatments.    No orders of the defined types were placed in this encounter.  The patient has a good understanding of the overall plan. she agrees with it. she will call with any problems that may  develop before the next visit here.  Total time spent: 30 mins including face to face time and time spent for planning, charting and coordination of care  Nicholas Lose, MD 04/14/2020  I, Cloyde Reams Dorshimer, am acting as scribe for Dr. Nicholas Lose.  I have reviewed the above documentation for accuracy and completeness, and I agree with the above.

## 2020-04-14 ENCOUNTER — Encounter: Payer: Self-pay | Admitting: *Deleted

## 2020-04-14 ENCOUNTER — Encounter: Payer: Self-pay | Admitting: Licensed Clinical Social Worker

## 2020-04-14 ENCOUNTER — Other Ambulatory Visit: Payer: Self-pay

## 2020-04-14 ENCOUNTER — Inpatient Hospital Stay: Payer: No Typology Code available for payment source

## 2020-04-14 ENCOUNTER — Ambulatory Visit
Admission: RE | Admit: 2020-04-14 | Discharge: 2020-04-14 | Disposition: A | Discharge status: Home / Self Care | Payer: No Typology Code available for payment source | Source: Ambulatory Visit | Attending: Radiation Oncology | Admitting: Radiation Oncology

## 2020-04-14 ENCOUNTER — Inpatient Hospital Stay (HOSPITAL_BASED_OUTPATIENT_CLINIC_OR_DEPARTMENT_OTHER): Payer: No Typology Code available for payment source | Admitting: Hematology and Oncology

## 2020-04-14 DIAGNOSIS — C519 Malignant neoplasm of vulva, unspecified: Secondary | ICD-10-CM

## 2020-04-14 DIAGNOSIS — Z5111 Encounter for antineoplastic chemotherapy: Secondary | ICD-10-CM | POA: Diagnosis not present

## 2020-04-14 LAB — CMP (CANCER CENTER ONLY)
ALT: 40 U/L (ref 0–44)
AST: 23 U/L (ref 15–41)
Albumin: 3.4 g/dL — ABNORMAL LOW (ref 3.5–5.0)
Alkaline Phosphatase: 55 U/L (ref 38–126)
Anion gap: 12 (ref 5–15)
BUN: 7 mg/dL (ref 6–20)
CO2: 22 mmol/L (ref 22–32)
Calcium: 9.1 mg/dL (ref 8.9–10.3)
Chloride: 102 mmol/L (ref 98–111)
Creatinine: 0.73 mg/dL (ref 0.44–1.00)
GFR, Est AFR Am: >60 mL/min (ref >60)
GFR, Estimated: >60 mL/min (ref >60)
Glucose, Bld: 106 mg/dL — ABNORMAL HIGH (ref 70–99)
Potassium: 2.6 mmol/L — CL (ref 3.5–5.1)
Sodium: 136 mmol/L (ref 135–145)
Total Bilirubin: 0.3 mg/dL (ref 0.3–1.2)
Total Protein: 7.3 g/dL (ref 6.5–8.1)

## 2020-04-14 LAB — CBC WITH DIFFERENTIAL (CANCER CENTER ONLY)
Abs Immature Granulocytes: 0.02 10*3/uL (ref 0.00–0.07)
Basophils Absolute: 0 10*3/uL (ref 0.0–0.1)
Basophils Relative: 1 %
Eosinophils Absolute: 0 10*3/uL (ref 0.0–0.5)
Eosinophils Relative: 0 %
HCT: 27.5 % — ABNORMAL LOW (ref 36.0–46.0)
Hemoglobin: 9.7 g/dL — ABNORMAL LOW (ref 12.0–15.0)
Immature Granulocytes: 1 %
Lymphocytes Relative: 30 %
Lymphs Abs: 1.1 10*3/uL (ref 0.7–4.0)
MCH: 29.8 pg (ref 26.0–34.0)
MCHC: 35.3 g/dL (ref 30.0–36.0)
MCV: 84.6 fL (ref 80.0–100.0)
Monocytes Absolute: 0.2 10*3/uL (ref 0.1–1.0)
Monocytes Relative: 6 %
Neutro Abs: 2.4 10*3/uL (ref 1.7–7.7)
Neutrophils Relative %: 62 %
Platelet Count: 126 10*3/uL — ABNORMAL LOW (ref 150–400)
RBC: 3.25 MIL/uL — ABNORMAL LOW (ref 3.87–5.11)
RDW: 14.9 % (ref 11.5–15.5)
WBC Count: 3.8 10*3/uL — ABNORMAL LOW (ref 4.0–10.5)
nRBC: 0 % (ref 0.0–0.2)

## 2020-04-14 MED ORDER — SODIUM CHLORIDE 0.9 % IV SOLN
10.0000 mg | Freq: Once | INTRAVENOUS | Status: AC
  Administered 2020-04-14: 10 mg via INTRAVENOUS
  Filled 2020-04-14: qty 10

## 2020-04-14 MED ORDER — FAMOTIDINE IN NACL 20-0.9 MG/50ML-% IV SOLN
INTRAVENOUS | Status: AC
  Filled 2020-04-14: qty 50

## 2020-04-14 MED ORDER — ACETAMINOPHEN 325 MG PO TABS
650.0000 mg | ORAL_TABLET | Freq: Once | ORAL | Status: AC
  Administered 2020-04-14: 650 mg via ORAL

## 2020-04-14 MED ORDER — HEPARIN SOD (PORK) LOCK FLUSH 100 UNIT/ML IV SOLN
500.0000 [IU] | Freq: Once | INTRAVENOUS | Status: AC | PRN
  Administered 2020-04-14: 500 [IU]
  Filled 2020-04-14: qty 5

## 2020-04-14 MED ORDER — PALONOSETRON HCL INJECTION 0.25 MG/5ML
0.2500 mg | Freq: Once | INTRAVENOUS | Status: AC
  Administered 2020-04-14: 0.25 mg via INTRAVENOUS

## 2020-04-14 MED ORDER — DIPHENHYDRAMINE HCL 50 MG/ML IJ SOLN
25.0000 mg | Freq: Once | INTRAMUSCULAR | Status: AC
  Administered 2020-04-14: 25 mg via INTRAVENOUS

## 2020-04-14 MED ORDER — SODIUM CHLORIDE 0.9 % IV SOLN
150.0000 mg | Freq: Once | INTRAVENOUS | Status: AC
  Administered 2020-04-14: 150 mg via INTRAVENOUS
  Filled 2020-04-14: qty 150

## 2020-04-14 MED ORDER — FAMOTIDINE IN NACL 20-0.9 MG/50ML-% IV SOLN
20.0000 mg | Freq: Once | INTRAVENOUS | Status: AC
  Administered 2020-04-14: 20 mg via INTRAVENOUS

## 2020-04-14 MED ORDER — ACETAMINOPHEN 325 MG PO TABS
ORAL_TABLET | ORAL | Status: AC
  Filled 2020-04-14: qty 2

## 2020-04-14 MED ORDER — SODIUM CHLORIDE 0.9 % IV SOLN
Freq: Once | INTRAVENOUS | Status: AC
  Filled 2020-04-14: qty 500

## 2020-04-14 MED ORDER — SODIUM CHLORIDE 0.9 % IV SOLN
Freq: Once | INTRAVENOUS | Status: AC
  Filled 2020-04-14: qty 250

## 2020-04-14 MED ORDER — DIPHENHYDRAMINE HCL 50 MG/ML IJ SOLN
INTRAMUSCULAR | Status: AC
  Filled 2020-04-14: qty 1

## 2020-04-14 MED ORDER — TRASTUZUMAB-ANNS CHEMO 150 MG IV SOLR
2.0000 mg/kg | Freq: Once | INTRAVENOUS | Status: AC
  Administered 2020-04-14: 189 mg via INTRAVENOUS
  Filled 2020-04-14: qty 9

## 2020-04-14 MED ORDER — SODIUM CHLORIDE 0.9 % IV SOLN
225.0000 mg | Freq: Once | INTRAVENOUS | Status: AC
  Administered 2020-04-14: 230 mg via INTRAVENOUS
  Filled 2020-04-14: qty 23

## 2020-04-14 MED ORDER — SODIUM CHLORIDE 0.9 % IV SOLN
45.0000 mg/m2 | Freq: Once | INTRAVENOUS | Status: AC
  Administered 2020-04-14: 96 mg via INTRAVENOUS
  Filled 2020-04-14: qty 16

## 2020-04-14 MED ORDER — POTASSIUM CHLORIDE CRYS ER 20 MEQ PO TBCR: 20.0000 meq | EXTENDED_RELEASE_TABLET | Freq: Every day | ORAL | 2 refills | Status: DC

## 2020-04-14 MED ORDER — SODIUM CHLORIDE 0.9% FLUSH
10.0000 mL | INTRAVENOUS | Status: DC | PRN
  Administered 2020-04-14: 10 mL
  Filled 2020-04-14: qty 10

## 2020-04-14 MED ORDER — PALONOSETRON HCL INJECTION 0.25 MG/5ML
INTRAVENOUS | Status: AC
  Filled 2020-04-14: qty 5

## 2020-04-14 MED FILL — POTASSIUM CHLORIDE CRYS ER: 20 | 60 days supply | Qty: 60 | Fill #0

## 2020-04-14 NOTE — Progress Notes (Signed)
[facility] CSW Progress Note  Holiday representative met with patient in infusion for ongoing support and to provide 3rd [employer]. Patient reports that it has been more of a struggle lately with the nausea, losing weight, and recent blood clot. Questioning "why me" more which is leading her to move toward psychiatrist's recommendation, seconded by this CSW, of ongoing therapy. CSW normalized feelings and encouraged ways of accepting what she cannot know and focusing on what she can control and what is positive in her life. Encouraged patient to call this CSW or psychiatrist for support while she gets connected to ongoing therapy.    Edwinna Areola Fatimata Talsma , LCSW

## 2020-04-14 NOTE — Patient Instructions (Addendum)
[facility] Discharge Instructions for Patients Receiving Chemotherapy  Today you received the following chemotherapy agents: Trastuzumab, Gemzar, Carboplatin  To help prevent nausea and vomiting after your treatment, we encourage you to take your nausea medication as directed.    If you develop nausea and vomiting that is not controlled by your nausea medication, call the clinic.   BELOW ARE SYMPTOMS THAT SHOULD BE REPORTED IMMEDIATELY:  *FEVER GREATER THAN 100.5 F  *CHILLS WITH OR WITHOUT FEVER  NAUSEA AND VOMITING THAT IS NOT CONTROLLED WITH YOUR NAUSEA MEDICATION  *UNUSUAL SHORTNESS OF BREATH  *UNUSUAL BRUISING OR BLEEDING  TENDERNESS IN MOUTH AND THROAT WITH OR WITHOUT PRESENCE OF ULCERS  *URINARY PROBLEMS  *BOWEL PROBLEMS  UNUSUAL RASH Items with * indicate a potential emergency and should be followed up as soon as possible.  Feel free to call the clinic should you have any questions or concerns. The clinic phone number is [phone].  Please show the [facility] at check-in to the Emergency Department and triage nurse.  Gemcitabine injection What is this medicine? GEMCITABINE (jem SYE ta been) is a chemotherapy drug. This medicine is used to treat many types of cancer like breast cancer, lung cancer, pancreatic cancer, and ovarian cancer. This medicine may be used for other purposes; ask your health care provider or pharmacist if you have questions. COMMON BRAND NAME(S): Gemzar, Infugem What should I tell my health care provider before I take this medicine? They need to know if you have any of these conditions:  blood disorders  infection  kidney disease  liver disease  lung or breathing disease, like asthma  recent or ongoing radiation therapy  an unusual or allergic reaction to gemcitabine, other chemotherapy, other medicines, foods, dyes, or preservatives  pregnant or trying to get pregnant  breast-feeding How should I use  this medicine? This drug is given as an infusion into a vein. It is administered in a hospital or clinic by a specially trained health care professional. Talk to your pediatrician regarding the use of this medicine in children. Special care may be needed. Overdosage: If you think you have taken too much of this medicine contact a poison control center or emergency room at once. NOTE: This medicine is only for you. Do not share this medicine with others. What if I miss a dose? It is important not to miss your dose. Call your doctor or health care professional if you are unable to keep an appointment. What may interact with this medicine?  medicines to increase blood counts like filgrastim, pegfilgrastim, sargramostim  some other chemotherapy drugs like cisplatin  vaccines Talk to your doctor or health care professional before taking any of these medicines:  acetaminophen  aspirin  ibuprofen  ketoprofen  naproxen This list may not describe all possible interactions. Give your health care provider a list of all the medicines, herbs, non-prescription drugs, or dietary supplements you use. Also tell them if you smoke, drink alcohol, or use illegal drugs. Some items may interact with your medicine. What should I watch for while using this medicine? Visit your doctor for checks on your progress. This drug may make you feel generally unwell. This is not uncommon, as chemotherapy can affect healthy cells as well as cancer cells. Report any side effects. Continue your course of treatment even though you feel ill unless your doctor tells you to stop. In some cases, you may be given additional medicines to help with side effects. Follow all directions for  their use. Call your doctor or health care professional for advice if you get a fever, chills or sore throat, or other symptoms of a cold or flu. Do not treat yourself. This drug decreases your body's ability to fight infections. Try to avoid being  around people who are sick. This medicine may increase your risk to bruise or bleed. Call your doctor or health care professional if you notice any unusual bleeding. Be careful brushing and flossing your teeth or using a toothpick because you may get an infection or bleed more easily. If you have any dental work done, tell your dentist you are receiving this medicine. Avoid taking products that contain aspirin, acetaminophen, ibuprofen, naproxen, or ketoprofen unless instructed by your doctor. These medicines may hide a fever. Do not become pregnant while taking this medicine or for 6 months after stopping it. Women should inform their doctor if they wish to become pregnant or think they might be pregnant. Men should not father a child while taking this medicine and for 3 months after stopping it. There is a potential for serious side effects to an unborn child. Talk to your health care professional or pharmacist for more information. Do not breast-feed an infant while taking this medicine or for at least 1 week after stopping it. Men should inform their doctors if they wish to father a child. This medicine may lower sperm counts. Talk with your doctor or health care professional if you are concerned about your fertility. What side effects may I notice from receiving this medicine? Side effects that you should report to your doctor or health care professional as soon as possible:  allergic reactions like skin rash, itching or hives, swelling of the face, lips, or tongue  breathing problems  pain, redness, or irritation at site where injected  signs and symptoms of a dangerous change in heartbeat or heart rhythm like chest pain; dizziness; fast or irregular heartbeat; palpitations; feeling faint or lightheaded, falls; breathing problems  signs of decreased platelets or bleeding - bruising, pinpoint red spots on the skin, black, tarry stools, blood in the urine  signs of decreased red blood cells -  unusually weak or tired, feeling faint or lightheaded, falls  signs of infection - fever or chills, cough, sore throat, pain or difficulty passing urine  signs and symptoms of kidney injury like trouble passing urine or change in the amount of urine  signs and symptoms of liver injury like dark yellow or brown urine; general ill feeling or flu-like symptoms; light-colored stools; loss of appetite; nausea; right upper belly pain; unusually weak or tired; yellowing of the eyes or skin  swelling of ankles, feet, hands Side effects that usually do not require medical attention (report to your doctor or health care professional if they continue or are bothersome):  constipation  diarrhea  hair loss  loss of appetite  nausea  rash  vomiting This list may not describe all possible side effects. Call your doctor for medical advice about side effects. You may report side effects to FDA at 1-800-FDA-1088. Where should I keep my medicine? This drug is given in a hospital or clinic and will not be stored at home. NOTE: This sheet is a summary. It may not cover all possible information. If you have questions about this medicine, talk to your doctor, pharmacist, or health care provider.  2020 Elsevier/Gold Standard (2017-11-15 18:06:11)

## 2020-04-14 NOTE — Assessment & Plan Note (Signed)
Breast cancer and vulvar cancers  02/20/2020:PET scan on 02/07/20 following a diagnosis of vulvar cancer showed a 1.7cm left breast mass and mildly hypermetabolic left axillary lymph nodes. Mammogram and [place] on 02/18/20 showed a 3.1cm left breast mass at the 11 o'clock position, palpable on exam, and a single abnormal left axillary lymph node with cortical thickening. Left breast biopsy on 02/20/20 showed invasive and in situ carcinoma in the breast and axilla, grade 3, HER-2 positive (3+), ER/PR+ 95%, Ki67 75%  Treatment plan: 1.Neoadjuvant chemoradiation for vulvar cancer with Taxol, carboplatin, Herceptin weekly x6 cycles 2.neoadjuvant chemotherapy with TCH Perjeta x3 cycles 3.Evaluation for breast conserving surgery if possible with sentinel lymph node study 3. Followed by adjuvant radiation therapy if patient had lumpectomy ------------------------------------------------------------------------------------------------------------------------------- Current treatment: Cycle6Taxol carboplatin Herceptin, weekly with radiation Labs reviewed Echocardiogram: 02/23/2020: EF 55 to 60% Chemo toxicities: Nausea: In spite of multiple antinausea medications.  I will reduce the dosage of her chemo further today. Leukopenia Diarrhea: Currently on Lomotil and Imodium.  Monitoring closely for toxicities  Return to clinicweekly for chemotherapy treatments.

## 2020-04-14 NOTE — Progress Notes (Signed)
CRITICAL VALUE ALERT  Critical Value:  K 2.6  Date & Time Notied:  04/14/20 at 0930  Provider Notified: Nicholas Lose, MD  Orders Received/Actions taken: MD notified, orders received for pt to receive 20 mEq IV potassium mixed with 500 mL NS. Pharmacy notified.

## 2020-04-15 ENCOUNTER — Other Ambulatory Visit: Payer: Self-pay

## 2020-04-15 ENCOUNTER — Other Ambulatory Visit: Payer: Self-pay | Admitting: Hematology and Oncology

## 2020-04-15 ENCOUNTER — Telehealth: Payer: Self-pay | Admitting: Hematology and Oncology

## 2020-04-15 ENCOUNTER — Ambulatory Visit
Admission: RE | Admit: 2020-04-15 | Discharge: 2020-04-15 | Disposition: A | Discharge status: Home / Self Care | Payer: No Typology Code available for payment source | Source: Ambulatory Visit | Attending: Radiation Oncology | Admitting: Radiation Oncology

## 2020-04-15 DIAGNOSIS — Z5111 Encounter for antineoplastic chemotherapy: Secondary | ICD-10-CM | POA: Diagnosis not present

## 2020-04-15 DIAGNOSIS — C519 Malignant neoplasm of vulva, unspecified: Secondary | ICD-10-CM

## 2020-04-15 MED FILL — PROCHLORPERAZINE 10 MG TAB: 10 | 7 days supply | Qty: 30 | Fill #0

## 2020-04-15 NOTE — Telephone Encounter (Signed)
No 8/10 los, no changes made to pt schedule   

## 2020-04-16 ENCOUNTER — Ambulatory Visit
Admission: RE | Admit: 2020-04-16 | Discharge: 2020-04-16 | Disposition: A | Discharge status: Home / Self Care | Payer: No Typology Code available for payment source | Source: Ambulatory Visit | Attending: Radiation Oncology | Admitting: Radiation Oncology

## 2020-04-16 ENCOUNTER — Other Ambulatory Visit: Payer: Self-pay

## 2020-04-16 DIAGNOSIS — Z5111 Encounter for antineoplastic chemotherapy: Secondary | ICD-10-CM | POA: Diagnosis not present

## 2020-04-16 LAB — CULTURE, BLOOD (ROUTINE X 2)
Culture: NO GROWTH
Culture: NO GROWTH
Special Requests: ADEQUATE
Special Requests: ADEQUATE

## 2020-04-17 ENCOUNTER — Other Ambulatory Visit: Payer: Self-pay

## 2020-04-17 ENCOUNTER — Ambulatory Visit
Admission: RE | Admit: 2020-04-17 | Discharge: 2020-04-17 | Disposition: A | Discharge status: Home / Self Care | Payer: No Typology Code available for payment source | Source: Ambulatory Visit | Attending: Radiation Oncology | Admitting: Radiation Oncology

## 2020-04-17 DIAGNOSIS — Z5111 Encounter for antineoplastic chemotherapy: Secondary | ICD-10-CM | POA: Diagnosis not present

## 2020-04-20 ENCOUNTER — Other Ambulatory Visit: Payer: Self-pay

## 2020-04-20 ENCOUNTER — Ambulatory Visit
Admission: RE | Admit: 2020-04-20 | Discharge: 2020-04-20 | Disposition: A | Discharge status: Home / Self Care | Payer: No Typology Code available for payment source | Source: Ambulatory Visit | Attending: Radiation Oncology | Admitting: Radiation Oncology

## 2020-04-20 DIAGNOSIS — Z5111 Encounter for antineoplastic chemotherapy: Secondary | ICD-10-CM | POA: Diagnosis not present

## 2020-04-20 MED FILL — Dexamethasone Sodium Phosphate Inj 100 MG/10ML: INTRAMUSCULAR | Qty: 1 | Status: AC

## 2020-04-20 NOTE — Progress Notes (Addendum)
Patient Care Team: Rutherford Guys, MD as PCP - General (Family Medicine) Mauro Kaufmann, RN as Oncology Nurse Navigator Rockwell Germany, RN as Oncology Nurse Navigator Nicholas Lose, MD as Consulting Physician (Hematology and Oncology) Alphonsa Overall, MD as Consulting Physician (General Surgery) Awanda Mink Craige Cotta, RN as Oncology Nurse Navigator (Oncology)  DIAGNOSIS:    ICD-10-CM   1. Malignant neoplasm of upper-inner quadrant of left breast in female, estrogen receptor positive ([place])  C50.212    Z17.0     SUMMARY OF ONCOLOGIC HISTORY: Oncology History  Vulvar cancer ([place])  01/29/2020 Initial Diagnosis   Vulvar cancer ([place])   03/11/2020 -  Chemotherapy   The patient had dexamethasone (DECADRON) 4 MG tablet, 4 mg (100 % of original dose 4 mg), Oral, Daily, 1 of 1 cycle, Start date: 02/27/2020, End date: -- Dose modification: 4 mg (original dose 4 mg, Cycle 0) palonosetron (ALOXI) injection 0.25 mg, 0.25 mg, Intravenous,  Once, 1 of 1 cycle Administration: 0.25 mg (03/11/2020), 0.25 mg (03/17/2020), 0.25 mg (03/24/2020), 0.25 mg (03/31/2020), 0.25 mg (04/07/2020), 0.25 mg (04/14/2020) CARBOplatin (PARAPLATIN) 300 mg in sodium chloride 0.9 % 250 mL chemo infusion, 300 mg (100 % of original dose 300 mg), Intravenous,  Once, 1 of 1 cycle Dose modification:   (original dose 300 mg, Cycle 1) Administration: 300 mg (03/11/2020), 290 mg (03/17/2020), 300 mg (03/24/2020), 230 mg (03/31/2020), 230 mg (04/07/2020), 230 mg (04/14/2020) PACLitaxel (TAXOL) 108 mg in sodium chloride 0.9 % 250 mL chemo infusion (</= 74m/m2), 50 mg/m2 = 108 mg, Intravenous,  Once, 1 of 1 cycle Dose modification: 45 mg/m2 (original dose 50 mg/m2, Cycle 1, Reason: Dose not tolerated) Administration: 108 mg (03/11/2020), 108 mg (03/17/2020), 108 mg (03/24/2020), 96 mg (03/31/2020), 96 mg (04/07/2020), 96 mg (04/14/2020) fosaprepitant (EMEND) 150 mg in sodium chloride 0.9 % 145 mL IVPB, 150 mg, Intravenous,  Once, 1 of 1 cycle Administration: 150  mg (03/24/2020), 150 mg (03/31/2020), 150 mg (04/07/2020), 150 mg (04/14/2020) trastuzumab-anns (KANJINTI) 378 mg in sodium chloride 0.9 % 250 mL chemo infusion, 4 mg/kg = 378 mg (100 % of original dose 4 mg/kg), Intravenous,  Once, 1 of 1 cycle Dose modification: 4 mg/kg (original dose 4 mg/kg, Cycle 1, Reason: Other (see comments), Comment: loading dose) Administration: 378 mg (03/11/2020), 189 mg (03/17/2020), 189 mg (03/24/2020), 189 mg (03/31/2020), 189 mg (04/07/2020), 189 mg (04/14/2020)  for chemotherapy treatment.    Malignant neoplasm of upper-inner quadrant of left breast in female, [place] positive [place]  02/20/2020 Initial Diagnosis   PET scan on 02/07/20 following a diagnosis of vulvar cancer showed a 1.7cm left breast mass and mildly hypermetabolic left axillary lymph nodes. [place] showed a 3.1cm left breast mass at the 11 o'clock position, palpable on exam, and a single abnormal left axillary lymph node with cortical thickening. Left breast biopsy on 02/20/20 showed invasive and in situ carcinoma in the breast and axilla, grade 3, HER-2 positive (3+), ER/PR+ 95%, Ki67 75%   02/25/2020 Cancer Staging   Staging form: Breast, AJCC 8th Edition - Clinical stage from 02/25/2020: Stage IB (cT2, cN1, cM0, G3, ER+, PR+, HER2+) - Signed by GNicholas Lose MD on 02/25/2020     CHIEF COMPLIANT: Cycle7 Taxol, Carboplatin, and Herceptin  INTERVAL HISTORY: CPAIRLEE SAWTELLis a 44y.o. with above-mentioned history of left breast cancer and vulvar cancercurrently on neoadjuvant chemotherapy treatment with weekly Taxol, Carboplatin, and Herceptin. She presents to the clinic todayfor cycle 7.  ALLERGIES:  is allergic to depakote [divalproex sodium], minocycline, and aspirin.  MEDICATIONS:  Current Outpatient Medications  Medication Sig Dispense Refill  . acetaminophen (TYLENOL) 500 MG tablet Take 1,000 mg by mouth every 6 (six) hours as needed for moderate pain or fever.    Marland Kitchen  dexamethasone (DECADRON) 4 MG tablet Take 1 tablet (4 mg total) by mouth daily. Start the day after chemotherapy for 2 days. (Patient not taking: Reported on 04/11/2020) 30 tablet 0  . diphenoxylate-atropine (LOMOTIL) 2.5-0.025 MG tablet 1 to 2 PO QID prn diarrhea (Patient taking differently: Take 1-2 tablets by mouth 4 (four) times daily as needed for diarrhea or loose stools. ) 45 tablet 1  . gabapentin (NEURONTIN) 300 MG capsule Take 1 capsule (300 mg total) by mouth daily. 90 capsule 3  . lidocaine (XYLOCAINE) 5 % ointment Apply 1 application topically 3 (three) times daily as needed. To affected area 35.44 g 0  . lidocaine-prilocaine (EMLA) cream Apply 1 application topically as needed (to port).    Marland Kitchen lisinopril-hydrochlorothiazide (ZESTORETIC) 20-12.5 MG tablet Take 1 tablet by mouth daily. 90 tablet 1  . LORazepam (ATIVAN) 1 MG tablet Take 1 tablet (1 mg total) by mouth 3 (three) times daily as needed for anxiety. 90 tablet 2  . Multiple Vitamin (MULTIVITAMIN) tablet Take 1 tablet by mouth daily.    Marland Kitchen OLANZapine (ZYPREXA) 15 MG tablet Take 1 tablet (15 mg total) by mouth at bedtime. 90 tablet 0  . omeprazole (PRILOSEC) 40 MG capsule TAKE 1 CAPSULE(40 MG) BY MOUTH TWICE DAILY BEFORE A MEAL (Patient taking differently: Take 40 mg by mouth 2 (two) times daily with a meal. \) 180 capsule 0  . ondansetron (ZOFRAN) 8 MG tablet Take 1 tablet (8 mg total) by mouth 2 (two) times daily as needed for refractory nausea / vomiting. Start on day 3 after chemo. 30 tablet 1  . potassium chloride SA (KLOR-CON) 20 MEQ tablet Take 1 tablet (20 mEq total) by mouth daily. 60 tablet 2  . Probiotic Product (PROBIOTIC DAILY PO) Take 1 tablet by mouth daily.     . prochlorperazine (COMPAZINE) 10 MG tablet TAKE 1 TABLET BY MOUTH EVERY 6 HOURS AS NEEDED FOR NAUSEA OR VOMITING 30 tablet 1  . RIVAROXABAN (XARELTO) VTE STARTER PACK (15 & 20 MG TABLETS) Follow package directions: Take one 54m tablet by mouth twice a day. On  day 22, switch to one 265mtablet once a day. Take with food. 51 each 0  . topiramate (TOPAMAX) 25 MG tablet TAKE 1 TABLET BY MOUTH AT BEDTIME (Patient taking differently: Take 25 mg by mouth at bedtime. ) 90 tablet 0  . traZODone (DESYREL) 100 MG tablet Take 1 tablet (100 mg total) by mouth at bedtime. 90 tablet 0  . venlafaxine XR (EFFEXOR XR) 37.5 MG 24 hr capsule Take three capsule (112.5 mg total) by mouth daily. 90 capsule 2   No current facility-administered medications for this visit.    PHYSICAL EXAMINATION: ECOG PERFORMANCE STATUS: 1 - Symptomatic but completely ambulatory  Vitals:   04/21/20 0946  BP: 129/80  Pulse: (!) 110  Resp: 18  Temp: (!) 97.4 F (36.3 C)  SpO2: 100%   Filed Weights   04/21/20 0946  Weight: 196 lb 1.6 oz (89 kg)    LABORATORY DATA:  I have reviewed the data as listed CMP Latest Ref Rng & Units 04/14/2020 04/11/2020 04/07/2020  Glucose 70 - 99 mg/dL 106(H) 127(H) 101(H)  BUN 6 - 20 mg/dL 7  7 10  Creatinine 0.44 - 1.00 mg/dL 0.73 0.58 0.72  Sodium 135 - 145 mmol/L 136 133(L) 138  Potassium 3.5 - 5.1 mmol/L 2.6(LL) 2.8(L) 3.5  Chloride 98 - 111 mmol/L 102 98 110  CO2 22 - 32 mmol/L 22 23 20(L)  Calcium 8.9 - 10.3 mg/dL 9.1 8.9 9.2  Total Protein 6.5 - 8.1 g/dL 7.3 7.3 6.9  Total Bilirubin 0.3 - 1.2 mg/dL 0.3 1.0 0.3  Alkaline Phos 38 - 126 U/L 55 55 55  AST 15 - 41 U/L 23 28 27   ALT 0 - 44 U/L 40 46(H) 45(H)    Lab Results  Component Value Date   WBC 3.5 (L) 04/21/2020   HGB 9.5 (L) 04/21/2020   HCT 27.5 (L) 04/21/2020   MCV 87.3 04/21/2020   PLT 189 04/21/2020   NEUTROABS 2.3 04/21/2020    ASSESSMENT & PLAN:  Malignant neoplasm of upper-inner quadrant of left breast in female, estrogen receptor positive ([place]) Breast cancer and vulvar cancers  02/20/2020:PET scan on 02/07/20 following a diagnosis of vulvar cancer showed a 1.7cm left breast mass and mildly hypermetabolic left axillary lymph nodes. Mammogram and [place] on 02/18/20 showed a  3.1cm left breast mass at the 11 o'clock position, palpable on exam, and a single abnormal left axillary lymph node with cortical thickening. Left breast biopsy on 02/20/20 showed invasive and in situ carcinoma in the breast and axilla, grade 3, HER-2 positive (3+), ER/PR+ 95%, Ki67 75%  Treatment plan: 1.Neoadjuvant chemoradiation for vulvar cancer with Taxol, carboplatin, Herceptin weekly x6 cycles 2.neoadjuvant chemotherapy with TCH Perjeta x3 cycles 3.Evaluation for breast conserving surgery if possible with sentinel lymph node study 3. Followed by adjuvant radiation therapy if patient had lumpectomy ------------------------------------------------------------------------------------------------------------------------------- Current treatment: Cycle7Taxol carboplatin Herceptin, weeklywith radiation (Taxol and carboplatin are being discontinued) Labs reviewed Echocardiogram: 02/23/2020: EF 55 to 60%  Chemo toxicities: Nausea: In spite of multiple antinausea medications.  We will discontinue Taxol and carboplatin and will just give her Herceptin today and next week. Leukopenia Diarrhea: Currently on Lomotil and Imodium.  I instructed her to schedule her Lomotil 3 times a day and take Imodium as needed. Severe fatigue  Our plan is to start Frederika for 3 cycles starting 05/19/2020  She is experiencing significant radiation dermatitis to the vulvar area. Monitoring closely for toxicities    No orders of the defined types were placed in this encounter.  The patient has a good understanding of the overall plan. she agrees with it. she will call with any problems that may develop before the next visit here.  Total time spent: 30 mins including face to face time and time spent for planning, charting and coordination of care  Nicholas Lose, MD 04/21/2020  I, Cloyde Reams Dorshimer, am acting as scribe for Dr. Nicholas Lose.  I have reviewed the above documentation for accuracy and  completeness, and I agree with the above.  Addendum: Because of severe nausea, we will administer Aloxi with her Herceptin.  Nausea is coming from prior chemotherapy and also radiation.  She has lost weight as a result of decreased oral intake.  She tried to take boost but she threw up this morning.

## 2020-04-21 ENCOUNTER — Inpatient Hospital Stay: Payer: No Typology Code available for payment source

## 2020-04-21 ENCOUNTER — Ambulatory Visit: Admission: RE | Admit: 2020-04-21 | Payer: No Typology Code available for payment source | Source: Ambulatory Visit

## 2020-04-21 ENCOUNTER — Inpatient Hospital Stay (HOSPITAL_BASED_OUTPATIENT_CLINIC_OR_DEPARTMENT_OTHER): Payer: No Typology Code available for payment source | Admitting: Hematology and Oncology

## 2020-04-21 ENCOUNTER — Encounter: Payer: Self-pay | Admitting: *Deleted

## 2020-04-21 ENCOUNTER — Other Ambulatory Visit: Payer: Self-pay

## 2020-04-21 VITALS — HR 106 | Temp 98.6°F

## 2020-04-21 DIAGNOSIS — C50212 Malignant neoplasm of upper-inner quadrant of left female breast: Secondary | ICD-10-CM | POA: Diagnosis not present

## 2020-04-21 DIAGNOSIS — Z170 Estrogen receptor positive status [ER+]: Secondary | ICD-10-CM | POA: Diagnosis not present

## 2020-04-21 DIAGNOSIS — C519 Malignant neoplasm of vulva, unspecified: Secondary | ICD-10-CM

## 2020-04-21 DIAGNOSIS — Z5111 Encounter for antineoplastic chemotherapy: Secondary | ICD-10-CM | POA: Diagnosis not present

## 2020-04-21 DIAGNOSIS — Z95828 Presence of other vascular implants and grafts: Secondary | ICD-10-CM

## 2020-04-21 LAB — CBC WITH DIFFERENTIAL (CANCER CENTER ONLY)
Abs Immature Granulocytes: 0.02 10*3/uL (ref 0.00–0.07)
Basophils Absolute: 0 10*3/uL (ref 0.0–0.1)
Basophils Relative: 0 %
Eosinophils Absolute: 0 10*3/uL (ref 0.0–0.5)
Eosinophils Relative: 0 %
HCT: 27.5 % — ABNORMAL LOW (ref 36.0–46.0)
Hemoglobin: 9.5 g/dL — ABNORMAL LOW (ref 12.0–15.0)
Immature Granulocytes: 1 %
Lymphocytes Relative: 23 %
Lymphs Abs: 0.8 10*3/uL (ref 0.7–4.0)
MCH: 30.2 pg (ref 26.0–34.0)
MCHC: 34.5 g/dL (ref 30.0–36.0)
MCV: 87.3 fL (ref 80.0–100.0)
Monocytes Absolute: 0.4 10*3/uL (ref 0.1–1.0)
Monocytes Relative: 10 %
Neutro Abs: 2.3 10*3/uL (ref 1.7–7.7)
Neutrophils Relative %: 66 %
Platelet Count: 189 10*3/uL (ref 150–400)
RBC: 3.15 MIL/uL — ABNORMAL LOW (ref 3.87–5.11)
RDW: 15.6 % — ABNORMAL HIGH (ref 11.5–15.5)
WBC Count: 3.5 10*3/uL — ABNORMAL LOW (ref 4.0–10.5)
nRBC: 0 % (ref 0.0–0.2)

## 2020-04-21 LAB — CMP (CANCER CENTER ONLY)
ALT: 31 U/L (ref 0–44)
AST: 20 U/L (ref 15–41)
Albumin: 3.5 g/dL (ref 3.5–5.0)
Alkaline Phosphatase: 58 U/L (ref 38–126)
Anion gap: 10 (ref 5–15)
BUN: 8 mg/dL (ref 6–20)
CO2: 24 mmol/L (ref 22–32)
Calcium: 10.4 mg/dL — ABNORMAL HIGH (ref 8.9–10.3)
Chloride: 103 mmol/L (ref 98–111)
Creatinine: 0.75 mg/dL (ref 0.44–1.00)
GFR, Est AFR Am: >60 mL/min (ref >60)
GFR, Estimated: >60 mL/min (ref >60)
Glucose, Bld: 115 mg/dL — ABNORMAL HIGH (ref 70–99)
Potassium: 3.1 mmol/L — ABNORMAL LOW (ref 3.5–5.1)
Sodium: 137 mmol/L (ref 135–145)
Total Bilirubin: 0.4 mg/dL (ref 0.3–1.2)
Total Protein: 8 g/dL (ref 6.5–8.1)

## 2020-04-21 MED ORDER — SODIUM CHLORIDE 0.9 % IV SOLN
150.0000 mg | Freq: Once | INTRAVENOUS | Status: DC
  Filled 2020-04-21: qty 5

## 2020-04-21 MED ORDER — SODIUM CHLORIDE 0.9% FLUSH
10.0000 mL | INTRAVENOUS | Status: DC | PRN
  Administered 2020-04-21: 10 mL
  Filled 2020-04-21: qty 10

## 2020-04-21 MED ORDER — ACETAMINOPHEN 325 MG PO TABS
ORAL_TABLET | ORAL | Status: AC
  Filled 2020-04-21: qty 2

## 2020-04-21 MED ORDER — HEPARIN SOD (PORK) LOCK FLUSH 100 UNIT/ML IV SOLN
500.0000 [IU] | Freq: Once | INTRAVENOUS | Status: AC | PRN
  Administered 2020-04-21: 500 [IU]
  Filled 2020-04-21: qty 5

## 2020-04-21 MED ORDER — PALONOSETRON HCL INJECTION 0.25 MG/5ML
INTRAVENOUS | Status: AC
  Filled 2020-04-21: qty 5

## 2020-04-21 MED ORDER — SODIUM CHLORIDE 0.9% FLUSH
10.0000 mL | Freq: Once | INTRAVENOUS | Status: AC
  Administered 2020-04-21: 10 mL
  Filled 2020-04-21: qty 10

## 2020-04-21 MED ORDER — FAMOTIDINE IN NACL 20-0.9 MG/50ML-% IV SOLN
INTRAVENOUS | Status: AC
  Filled 2020-04-21: qty 50

## 2020-04-21 MED ORDER — DIPHENHYDRAMINE HCL 50 MG/ML IJ SOLN
25.0000 mg | Freq: Once | INTRAMUSCULAR | Status: AC
  Administered 2020-04-21: 25 mg via INTRAVENOUS

## 2020-04-21 MED ORDER — DIPHENHYDRAMINE HCL 50 MG/ML IJ SOLN
INTRAMUSCULAR | Status: AC
  Filled 2020-04-21: qty 1

## 2020-04-21 MED ORDER — ACETAMINOPHEN 325 MG PO TABS
650.0000 mg | ORAL_TABLET | Freq: Once | ORAL | Status: AC
  Administered 2020-04-21: 650 mg via ORAL

## 2020-04-21 MED ORDER — SODIUM CHLORIDE 0.9 % IV SOLN
Freq: Once | INTRAVENOUS | Status: AC
  Filled 2020-04-21: qty 250

## 2020-04-21 MED ORDER — TRASTUZUMAB-ANNS CHEMO 150 MG IV SOLR
2.0000 mg/kg | Freq: Once | INTRAVENOUS | Status: AC
  Administered 2020-04-21: 189 mg via INTRAVENOUS
  Filled 2020-04-21: qty 9

## 2020-04-21 MED ORDER — PALONOSETRON HCL INJECTION 0.25 MG/5ML
0.2500 mg | Freq: Once | INTRAVENOUS | Status: AC
  Administered 2020-04-21: 0.25 mg via INTRAVENOUS

## 2020-04-21 NOTE — Assessment & Plan Note (Signed)
Breast cancer and vulvar cancers  02/20/2020:PET scan on 02/07/20 following a diagnosis of vulvar cancer showed a 1.7cm left breast mass and mildly hypermetabolic left axillary lymph nodes. Mammogram and [place] on 02/18/20 showed a 3.1cm left breast mass at the 11 o'clock position, palpable on exam, and a single abnormal left axillary lymph node with cortical thickening. Left breast biopsy on 02/20/20 showed invasive and in situ carcinoma in the breast and axilla, grade 3, HER-2 positive (3+), ER/PR+ 95%, Ki67 75%  Treatment plan: 1.Neoadjuvant chemoradiation for vulvar cancer with Taxol, carboplatin, Herceptin weekly x6 cycles 2.neoadjuvant chemotherapy with TCH Perjeta x3 cycles 3.Evaluation for breast conserving surgery if possible with sentinel lymph node study 3. Followed by adjuvant radiation therapy if patient had lumpectomy ------------------------------------------------------------------------------------------------------------------------------- Current treatment: Cycle7Taxol carboplatin Herceptin, weeklywith radiation Labs reviewed Echocardiogram: 02/23/2020: EF 55 to 60% Chemo toxicities: Nausea: In spite of multiple antinausea medications.  Leukopenia Diarrhea: Currently on Lomotil and Imodium.  I instructed her to schedule her Lomotil 3 times a day and take Imodium as needed.  Monitoring closely for toxicities

## 2020-04-21 NOTE — Progress Notes (Signed)
Nutrition Follow-up:  Patient with vulvar and breast cancer.  Patient receiving neoadjuvant chemotherapy and radiation.   Met with patient in infusion.  Patient just receiving herceptin today.  Patient reports continued nausea and inability to eat.  Eating few bites/sips.  Drank ensure max protein shake last night and vomited shortly after.  Noted recent DVT.      Medications: reviewed  Labs: reviewed  Anthropometrics:   Weight 199 lb decreased from 206 lb on 7/27   NUTRITION DIAGNOSIS: Inadequate oral intake continues   INTERVENTION:  Patient did not like clear juice shakes, continue with "milky" shakes if able to tolerate Encouraged small frequent mini snacks/sips of liquids Words of encouragement offered Patient has contact information    MONITORING, EVALUATION, GOAL: weight trends, intake   NEXT VISIT: to be determined with treatment   B. , RD, LDN Registered Dietitian [phone] (mobile)     

## 2020-04-21 NOTE — Patient Instructions (Signed)
[facility] Discharge Instructions for Patients Receiving Chemotherapy  Today you received the following chemotherapy agents Trastuzumab-anns (KANJINTI).  To help prevent nausea and vomiting after your treatment, we encourage you to take your nausea medication as prescribed.   If you develop nausea and vomiting that is not controlled by your nausea medication, call the clinic.   BELOW ARE SYMPTOMS THAT SHOULD BE REPORTED IMMEDIATELY:  *FEVER GREATER THAN 100.5 F  *CHILLS WITH OR WITHOUT FEVER  NAUSEA AND VOMITING THAT IS NOT CONTROLLED WITH YOUR NAUSEA MEDICATION  *UNUSUAL SHORTNESS OF BREATH  *UNUSUAL BRUISING OR BLEEDING  TENDERNESS IN MOUTH AND THROAT WITH OR WITHOUT PRESENCE OF ULCERS  *URINARY PROBLEMS  *BOWEL PROBLEMS  UNUSUAL RASH Items with * indicate a potential emergency and should be followed up as soon as possible.  Feel free to call the clinic should you have any questions or concerns. The clinic phone number is [phone].  Please show the CHEMO ALERT CARD at check-in to the Emergency Department and triage nurse.   

## 2020-04-21 NOTE — Progress Notes (Signed)
Verbal order from Dr. Lindi Adie: okay to treat with HR of 106.

## 2020-04-22 ENCOUNTER — Other Ambulatory Visit: Payer: Self-pay

## 2020-04-22 ENCOUNTER — Ambulatory Visit
Admission: RE | Admit: 2020-04-22 | Discharge: 2020-04-22 | Disposition: A | Discharge status: Home / Self Care | Payer: No Typology Code available for payment source | Source: Ambulatory Visit | Attending: Radiation Oncology | Admitting: Radiation Oncology

## 2020-04-22 DIAGNOSIS — Z5111 Encounter for antineoplastic chemotherapy: Secondary | ICD-10-CM | POA: Diagnosis not present

## 2020-04-23 ENCOUNTER — Other Ambulatory Visit: Payer: Self-pay

## 2020-04-23 ENCOUNTER — Telehealth: Payer: Self-pay | Admitting: Hematology and Oncology

## 2020-04-23 ENCOUNTER — Other Ambulatory Visit: Payer: Self-pay | Admitting: Internal Medicine

## 2020-04-23 ENCOUNTER — Encounter: Payer: Self-pay | Admitting: *Deleted

## 2020-04-23 ENCOUNTER — Ambulatory Visit
Admission: RE | Admit: 2020-04-23 | Discharge: 2020-04-23 | Disposition: A | Discharge status: Home / Self Care | Payer: No Typology Code available for payment source | Source: Ambulatory Visit | Attending: Radiation Oncology | Admitting: Radiation Oncology

## 2020-04-23 DIAGNOSIS — Z5111 Encounter for antineoplastic chemotherapy: Secondary | ICD-10-CM | POA: Diagnosis not present

## 2020-04-23 MED FILL — OLANZapine 15 MG TABS: 15 | 90 days supply | Qty: 90 | Fill #0

## 2020-04-23 MED FILL — OMEPRAZOLE 40 MG CPDR: 40 | 90 days supply | Qty: 180 | Fill #0

## 2020-04-23 MED FILL — GABAPENTIN 300 MG CAPSULE: 300 | 90 days supply | Qty: 90 | Fill #1

## 2020-04-23 NOTE — Telephone Encounter (Signed)
Scheduled per 8/17 los. Called and left a msg. Printout an updated appt calendar for pt

## 2020-04-24 ENCOUNTER — Other Ambulatory Visit: Payer: Self-pay

## 2020-04-24 ENCOUNTER — Ambulatory Visit
Admission: RE | Admit: 2020-04-24 | Discharge: 2020-04-24 | Disposition: A | Discharge status: Home / Self Care | Payer: No Typology Code available for payment source | Source: Ambulatory Visit | Attending: Radiation Oncology | Admitting: Radiation Oncology

## 2020-04-24 DIAGNOSIS — Z5111 Encounter for antineoplastic chemotherapy: Secondary | ICD-10-CM | POA: Diagnosis not present

## 2020-04-24 MED FILL — VENLAFAXINE HCL ER 37.5 MG: 37.5 | 30 days supply | Qty: 90 | Fill #1

## 2020-04-27 ENCOUNTER — Ambulatory Visit
Admission: RE | Admit: 2020-04-27 | Discharge: 2020-04-27 | Disposition: A | Discharge status: Home / Self Care | Payer: No Typology Code available for payment source | Source: Ambulatory Visit | Attending: Radiation Oncology | Admitting: Radiation Oncology

## 2020-04-27 ENCOUNTER — Other Ambulatory Visit: Payer: Self-pay

## 2020-04-27 DIAGNOSIS — Z5111 Encounter for antineoplastic chemotherapy: Secondary | ICD-10-CM | POA: Diagnosis not present

## 2020-04-27 MED FILL — Fosaprepitant Dimeglumine For IV Infusion 150 MG (Base Eq): INTRAVENOUS | Qty: 5 | Status: AC

## 2020-04-28 ENCOUNTER — Ambulatory Visit: Payer: No Typology Code available for payment source

## 2020-04-28 ENCOUNTER — Telehealth: Payer: Self-pay

## 2020-04-28 ENCOUNTER — Inpatient Hospital Stay: Payer: No Typology Code available for payment source

## 2020-04-28 ENCOUNTER — Other Ambulatory Visit: Payer: Self-pay | Admitting: Hematology and Oncology

## 2020-04-28 DIAGNOSIS — C519 Malignant neoplasm of vulva, unspecified: Secondary | ICD-10-CM

## 2020-04-28 MED ORDER — PROMETHAZINE HCL 25 MG PO TABS: 25.0000 mg | ORAL_TABLET | Freq: Four times a day (QID) | ORAL | 3 refills | Status: DC | PRN

## 2020-04-28 MED FILL — PROCHLORPERAZINE 10 MG TAB: 10 | 7 days supply | Qty: 30 | Fill #1

## 2020-04-28 MED FILL — PROMETHAZINE 25 MG TABLET: 25 | 7 days supply | Qty: 30 | Fill #0

## 2020-04-28 MED FILL — ONDANSETRON HCL 8 MG TABLET: 8 | 15 days supply | Qty: 30 | Fill #0

## 2020-04-28 NOTE — Progress Notes (Signed)
Patient is experience lots of nausea and vomiting in spite of Zofran and Compazine. I sent a prescription for Phenergan. We will try to see if she can come in tomorrow for fluids and antiemetics.

## 2020-04-28 NOTE — Progress Notes (Signed)
Per Dr. Lindi Adie ok to d/c high level antiemetics & just Compazine w/ Trastuzumab.  Kennith Center, Pharm.D., CPP 04/28/2020@8 :24 AM

## 2020-04-28 NOTE — Telephone Encounter (Signed)
Pt called stating she was not able to come to chemo appt today as she was too sick (N/V) and is not able to eat/drink despite Zofran & Compazine. Dr Lindi Adie made aware and he prefers pt come in 8/25 for IVF + antiemetics and he will call in phenergan. Pt made aware and is in agreement with plan. Message sent to scheduling for appt and to r/s chemo that was missed today.

## 2020-04-29 ENCOUNTER — Encounter: Payer: Self-pay | Admitting: Medical

## 2020-04-29 ENCOUNTER — Inpatient Hospital Stay: Payer: No Typology Code available for payment source | Admitting: Medical

## 2020-04-29 ENCOUNTER — Ambulatory Visit: Payer: No Typology Code available for payment source

## 2020-04-29 ENCOUNTER — Other Ambulatory Visit: Payer: Self-pay | Admitting: Medical

## 2020-04-29 ENCOUNTER — Other Ambulatory Visit: Payer: Self-pay | Admitting: *Deleted

## 2020-04-29 ENCOUNTER — Other Ambulatory Visit: Payer: Self-pay

## 2020-04-29 VITALS — BP 105/86 | HR 111 | Temp 98.7°F | Resp 18 | Ht 66.0 in | Wt 190.6 lb

## 2020-04-29 DIAGNOSIS — Z95828 Presence of other vascular implants and grafts: Secondary | ICD-10-CM

## 2020-04-29 DIAGNOSIS — R300 Dysuria: Secondary | ICD-10-CM

## 2020-04-29 DIAGNOSIS — R110 Nausea: Secondary | ICD-10-CM

## 2020-04-29 DIAGNOSIS — Z170 Estrogen receptor positive status [ER+]: Secondary | ICD-10-CM

## 2020-04-29 DIAGNOSIS — C50212 Malignant neoplasm of upper-inner quadrant of left female breast: Secondary | ICD-10-CM

## 2020-04-29 DIAGNOSIS — R112 Nausea with vomiting, unspecified: Secondary | ICD-10-CM

## 2020-04-29 DIAGNOSIS — Z5111 Encounter for antineoplastic chemotherapy: Secondary | ICD-10-CM | POA: Diagnosis not present

## 2020-04-29 MED ORDER — ONDANSETRON HCL 4 MG/2ML IJ SOLN
INTRAMUSCULAR | Status: AC
  Filled 2020-04-29: qty 4

## 2020-04-29 MED ORDER — SODIUM CHLORIDE 0.9 % IV SOLN
10.0000 mg | Freq: Once | INTRAVENOUS | Status: AC
  Administered 2020-04-29: 10 mg via INTRAVENOUS
  Filled 2020-04-29: qty 10

## 2020-04-29 MED ORDER — CIPROFLOXACIN HCL 500 MG PO TABS: 500.0000 mg | ORAL_TABLET | Freq: Two times a day (BID) | ORAL | 0 refills | Status: AC

## 2020-04-29 MED ORDER — SODIUM CHLORIDE 0.9 % IV SOLN
Freq: Once | INTRAVENOUS | Status: AC
  Filled 2020-04-29: qty 250

## 2020-04-29 MED ORDER — LORAZEPAM 2 MG/ML IJ SOLN
0.5000 mg | Freq: Once | INTRAMUSCULAR | Status: AC
  Administered 2020-04-29: 0.5 mg via INTRAVENOUS

## 2020-04-29 MED ORDER — HEPARIN SOD (PORK) LOCK FLUSH 100 UNIT/ML IV SOLN
500.0000 [IU] | Freq: Once | INTRAVENOUS | Status: AC
  Administered 2020-04-29: 500 [IU]
  Filled 2020-04-29: qty 5

## 2020-04-29 MED ORDER — LORAZEPAM 2 MG/ML IJ SOLN
INTRAMUSCULAR | Status: AC
  Filled 2020-04-29: qty 1

## 2020-04-29 MED ORDER — ONDANSETRON HCL 4 MG/2ML IJ SOLN
8.0000 mg | Freq: Once | INTRAMUSCULAR | Status: AC
  Administered 2020-04-29: 8 mg via INTRAVENOUS

## 2020-04-29 MED ORDER — LORAZEPAM 2 MG/ML IJ SOLN: 0.5000 mg | Freq: Once | INTRAMUSCULAR | Status: DC

## 2020-04-29 MED ORDER — SODIUM CHLORIDE 0.9% FLUSH
10.0000 mL | Freq: Once | INTRAVENOUS | Status: AC
  Administered 2020-04-29: 10 mL
  Filled 2020-04-29: qty 10

## 2020-04-29 MED ORDER — SODIUM CHLORIDE 0.9 % IV SOLN
Freq: Once | INTRAVENOUS | Status: DC
  Filled 2020-04-29: qty 250

## 2020-04-29 MED FILL — CIPROFLOXACIN HCL 500 MG TA: 500 | 5 days supply | Qty: 10 | Fill #0

## 2020-04-29 NOTE — Progress Notes (Unsigned)
Pt unable to give urine sample due to severe frequency/urgency of urination.  PA Lucianne Lei aware, ok to d/c urinalysis/culture at this time.  Lab aware.

## 2020-04-29 NOTE — Patient Instructions (Signed)
Urinary Tract Infection, Adult A urinary tract infection (UTI) is an infection of any part of the urinary tract. The urinary tract includes:  The kidneys.  The ureters.  The bladder.  The urethra. These organs make, store, and get rid of pee (urine) in the body. What are the causes? This is caused by germs (bacteria) in your genital area. These germs grow and cause swelling (inflammation) of your urinary tract. What increases the risk? You are more likely to develop this condition if:  You have a small, thin tube (catheter) to drain pee.  You cannot control when you pee or poop (incontinence).  You are female, and: ? You use these methods to prevent pregnancy:  A medicine that kills sperm (spermicide).  A device that blocks sperm (diaphragm). ? You have low levels of a female hormone (estrogen). ? You are pregnant.  You have genes that add to your risk.  You are sexually active.  You take antibiotic medicines.  You have trouble peeing because of: ? A prostate that is bigger than normal, if you are female. ? A blockage in the part of your body that drains pee from the bladder (urethra). ? A kidney stone. ? A nerve condition that affects your bladder (neurogenic bladder). ? Not getting enough to drink. ? Not peeing often enough.  You have other conditions, such as: ? Diabetes. ? A weak disease-fighting system (immune system). ? Sickle cell disease. ? Gout. ? Injury of the spine. What are the signs or symptoms? Symptoms of this condition include:  Needing to pee right away (urgently).  Peeing often.  Peeing small amounts often.  Pain or burning when peeing.  Blood in the pee.  Pee that smells bad or not like normal.  Trouble peeing.  Pee that is cloudy.  Fluid coming from the vagina, if you are female.  Pain in the belly or lower back. Other symptoms include:  Throwing up (vomiting).  No urge to eat.  Feeling mixed up (confused).  Being tired  and grouchy (irritable).  A fever.  Watery poop (diarrhea). How is this treated? This condition may be treated with:  Antibiotic medicine.  Other medicines.  Drinking enough water. Follow these instructions at home:  Medicines  Take over-the-counter and prescription medicines only as told by your doctor.  If you were prescribed an antibiotic medicine, take it as told by your doctor. Do not stop taking it even if you start to feel better. General instructions  Make sure you: ? Pee until your bladder is empty. ? Do not hold pee for a long time. ? Empty your bladder after sex. ? Wipe from front to back after pooping if you are a female. Use each tissue one time when you wipe.  Drink enough fluid to keep your pee pale yellow.  Keep all follow-up visits as told by your doctor. This is important. Contact a doctor if:  You do not get better after 1-2 days.  Your symptoms go away and then come back. Get help right away if:  You have very bad back pain.  You have very bad pain in your lower belly.  You have a fever.  You are sick to your stomach (nauseous).  You are throwing up. Summary  A urinary tract infection (UTI) is an infection of any part of the urinary tract.  This condition is caused by germs in your genital area.  There are many risk factors for a UTI. These include having a small, thin   tube to drain pee and not being able to control when you pee or poop.  Treatment includes antibiotic medicines for germs.  Drink enough fluid to keep your pee pale yellow. This information is not intended to replace advice given to you by your health care provider. Make sure you discuss any questions you have with your health care provider. Document Revised: 08/09/2018 Document Reviewed: 03/01/2018 Elsevier Patient Education  2020 [employer]  

## 2020-04-30 ENCOUNTER — Ambulatory Visit: Payer: No Typology Code available for payment source

## 2020-04-30 ENCOUNTER — Ambulatory Visit
Admission: RE | Admit: 2020-04-30 | Discharge: 2020-04-30 | Disposition: A | Discharge status: Home / Self Care | Payer: No Typology Code available for payment source | Source: Ambulatory Visit | Attending: Radiation Oncology | Admitting: Radiation Oncology

## 2020-04-30 DIAGNOSIS — Z5111 Encounter for antineoplastic chemotherapy: Secondary | ICD-10-CM | POA: Diagnosis not present

## 2020-05-01 ENCOUNTER — Ambulatory Visit: Payer: No Typology Code available for payment source

## 2020-05-01 ENCOUNTER — Ambulatory Visit
Admission: RE | Admit: 2020-05-01 | Discharge: 2020-05-01 | Disposition: A | Discharge status: Home / Self Care | Payer: No Typology Code available for payment source | Source: Ambulatory Visit | Attending: Radiation Oncology | Admitting: Radiation Oncology

## 2020-05-01 ENCOUNTER — Other Ambulatory Visit: Payer: Self-pay

## 2020-05-01 ENCOUNTER — Encounter: Payer: Self-pay | Admitting: Licensed Clinical Social Worker

## 2020-05-01 DIAGNOSIS — Z5111 Encounter for antineoplastic chemotherapy: Secondary | ICD-10-CM | POA: Diagnosis not present

## 2020-05-01 NOTE — Progress Notes (Signed)
[facility] Progress Note   AVERIE HORNBAKER [id number] October 27, 1975 44 y.o.  Sandra Miller is managed by Dr. Nicholas Lose  Actively treated with chemotherapy/immunotherapy/hormonal therapy: yes  Current therapy: Taxol, Carboplatin, and Herceptin  Last treated: 04/21/2020 (cycle #7)  Next scheduled appointment with provider: 05/05/2020  Assessment: Plan:    Malignant neoplasm of upper-inner quadrant of left breast in female, estrogen receptor positive ([place]) - Plan: 0.9 %  sodium chloride infusion  Nausea without vomiting - Plan: dexamethasone (DECADRON) 10 mg in sodium chloride 0.9 % 50 mL IVPB  Nausea and vomiting, intractability of vomiting not specified, unspecified vomiting type - Plan: ondansetron (ZOFRAN) injection 8 mg, LORazepam (ATIVAN) injection 0.5 mg  Dysuria - Plan: Urinalysis, Complete w Microscopic, Urine Culture, ciprofloxacin (CIPRO) 500 MG tablet  Port-A-Cath in place - Plan: heparin lock flush 100 unit/mL, sodium chloride flush (NS) 0.9 % injection 10 mL   ER positive malignant neoplasm of the left breast: [name] continues to be managed by Dr. Lindi Adie and is status post cycle #7 of Taxol, carboplatin, and Herceptin which was dosed on 04/21/2020.  She is scheduled to see Dr. Nicholas Lose and follow-up on 05/05/2020.  Nausea and vomiting: The patient was given Zofran 8 mg IV and Ativan 0.5 mg IV.  Additionally she was given 1 L normal saline.  Dysuria: The patient had a urinalysis and a urine culture collected today.  She was placed on Cipro 500 mg p.o. twice daily.  Please see After Visit Summary for patient specific instructions.  Future Appointments  Date Time Provider [place]  05/04/2020  2:45 PM [facility] LINAC 4 [facility] None  05/04/2020  3:15 PM [facility] INFUSION [facility] None  05/05/2020  2:45 PM [facility] LINAC 4 [facility] None  05/06/2020  2:15 PM [facility] LINAC 4 [facility] None  05/07/2020  2:55 PM  [facility] LINAC 4 [facility] None  05/08/2020  3:20 PM [facility] LINAC 4 [facility] None  05/19/2020  7:30 AM [facility] LAB [facility] None  05/19/2020  7:45 AM [facility] INFUSION [facility] None  05/19/2020  8:15 AM Nicholas Lose, MD [facility] None  05/19/2020  9:00 AM [facility] INFUSION [facility] None  05/19/2020  9:45 AM Jennet Maduro, RD [facility] None  05/26/2020  9:45 AM [facility] LAB [facility] None  05/26/2020 10:00 AM [facility] INFUSION [facility] None  05/26/2020 10:30 AM Nicholas Lose, MD [facility] None  06/09/2020  8:30 AM [facility] [place] FLUSH [facility] None  06/09/2020  9:00 AM Nicholas Lose, MD [facility] None  06/09/2020  9:45 AM [facility] INFUSION [facility] None  06/09/2020 10:00 AM Stoisits, Michelle E, LCSW [facility] None  06/25/2020  9:20 AM Arfeen, Arlyce Harman, MD [facility] None  06/30/2020 10:00 AM [facility] LAB [facility] None  06/30/2020 10:15 AM [facility] [place] FLUSH [facility] None  06/30/2020 10:45 AM Nicholas Lose, MD [facility] None  06/30/2020 11:45 AM [facility] INFUSION [facility] None    Orders Placed This Encounter  Procedures  . Urine Culture  . Urinalysis, Complete w Microscopic       Subjective:   Patient ID:  Sandra Miller is a 44 y.o. (DOB 06-21-1976) female.  Chief Complaint:  Chief Complaint  Patient presents with  . Emesis    HPI [name]  is a 44 y.o. female with a diagnosis of an ER positive malignant neoplasm of the left breast.  She continues to be followed by Dr. Nicholas Lose and is status post cycle #7 of Taxol, carboplatin, and Herceptin which was dosed on 04/21/2020.  She is  scheduled to see Dr. Nicholas Lose and follow-up on 05/05/2020.  She presents today with nausea and vomiting and the report of dysuria.  She also reports fatigue.  She is mildly tachycardic at 111 bpm today.  She denies fevers, chills, sweats, constipation, or diarrhea.  Medications: I have reviewed the patient's current  medications.  Allergies:  Allergies  Allergen Reactions  . Depakote [Divalproex Sodium] Nausea And Vomiting  . Minocycline Hives  . Aspirin Hives    States able to tolerate Goody Powders without any problem except for GI upset    Past Medical History:  Diagnosis Date  . Bipolar 1 disorder ([place])   . Cancer ([place])   . Complication of anesthesia    wakes up during procedures  . GAD (generalized anxiety disorder)   . Genital HSV    currently per pt  no break out 03-22-2016   . GERD (gastroesophageal reflux disease)   . Hiatal hernia   . History of cervical dysplasia    2012 laser ablation  . History of esophageal dilatation    for dysphasia -- x2 dilated  . History of gastric ulcer   . History of Helicobacter pylori infection    remote hx  . History of hidradenitis suppurativa    "gets all over body intermittantly"    . History of hypertension    no issue since stopped drinking alcohol 2014  . History of kidney stones   . History of panic attacks   . Iron deficiency anemia   . Left ureteral stone   . OCD (obsessive compulsive disorder)   . PTSD (post-traumatic stress disorder)   . Recovering alcoholic in remission ([place])    since 2014  . RLS (restless legs syndrome)   . Smokers' cough ([place])   . Urgency of urination   . Yeast infection involving the vagina and surrounding area    secondary to taking antibiotic    Past Surgical History:  Procedure Laterality Date  . CESAREAN SECTION  1995   w/  Bilateral Tubal Ligation  . COLONOSCOPY  last one 08-09-2013  . CYSTOSCOPY W/ URETERAL STENT PLACEMENT Left 03/29/2016   Procedure: CYSTOSCOPY WITH STENT REPLACEMENT;  Surgeon: Nickie Retort, MD;  Location[facility];  Service: Urology;  Laterality: Left;  . CYSTOSCOPY WITH RETROGRADE PYELOGRAM, URETEROSCOPY AND STENT PLACEMENT Left 03/08/2016   Procedure: CYSTOSCOPY WITH  LEFT RETROGRADE PYELOGRAM, AND STENT PLACEMENT;  Surgeon: Nickie Retort, MD;   Location: [facility];  Service: Urology;  Laterality: Left;  . CYSTOSCOPY/RETROGRADE/URETEROSCOPY/STONE EXTRACTION WITH BASKET Left 03/29/2016   Procedure: CYSTOSCOPY/RETROGRADE/URETEROSCOPY/STONE EXTRACTION WITH BASKET;  Surgeon: Nickie Retort, MD;  Location[facility];  Service: Urology;  Laterality: Left;  . ENDOMETRIAL ABLATION W/ NOVASURE  04-01-2010  . ESOPHAGOGASTRODUODENOSCOPY  last one 08-09-2013  . KNEE ARTHROSCOPY Left as teen  . LASER ABLATION OF THE CERVIX  2012 approx  . PORTACATH PLACEMENT Right 03/06/2020   Procedure: INSERTION PORT-A-CATH WITH ULTRASOUND GUIDANCE;  Surgeon: Alphonsa Overall, MD;  Location: [place];  Service: General;  Laterality: Right;  . TRANSTHORACIC ECHOCARDIOGRAM  05-19-2006   lvsf normal, ef 55-65%, there was mild flattening of the interventricular septum during diastoli/  RV size at upper limits normal  . TUBAL LIGATION    . WISDOM TOOTH EXTRACTION  age 69 's    Family History  Problem Relation Age of Onset  . Heart disease Father   . Lung cancer Father   . Alcohol abuse Father   .  Heart disease Mother   . Depression Mother   . Anxiety disorder Mother   . Drug abuse Brother   . Alcohol abuse Brother   . Drug abuse Brother   . ADD / ADHD Brother   . Colon polyps Brother   . Stomach cancer Paternal Grandfather   . Diabetes Maternal Grandfather   . Diabetes Paternal Grandmother   . Kidney disease Maternal Uncle   . Cirrhosis Cousin        alcoholic  . Anxiety disorder Maternal Aunt   . Depression Maternal Aunt   . Cancer Cousin        cervical cancer    Social History   Socioeconomic History  . Marital status: Single    Spouse name: Not on file  . Number of children: Not on file  . Years of education: Not on file  . Highest education level: Not on file  Occupational History  . Not on file  Tobacco Use  . Smoking status: Current Every Day Smoker    Packs/day: 0.50    Years: 22.00    Pack years:  11.00    Types: Cigarettes  . Smokeless tobacco: Never Used  . Tobacco comment: Recently started a smoking cessation class.   Vaping Use  . Vaping Use: Never used  Substance and Sexual Activity  . Alcohol use: Not Currently    Alcohol/week: 0.0 standard drinks    Comment:  hx alcohollism  in remission since 2014  . Drug use: No    Comment: hx  marijuana use  . Sexual activity: Yes    Partners: Male    Birth control/protection: Surgical  Other Topics Concern  . Not on file  Social History Narrative   Former [facility] patient.      Was on disability at one point.   Return to the workforce.  40 hours a week at [employer], 10 hours a week on the weekends at [employer] in [place] at night 10 to 12 hours a week.      Has grown children, she lives alone with a pet, continues to smoke no alcohol or drug use at this time      History of EtOH abuse and THC use.         Social Determinants of Health   Financial Resource Strain:   . Difficulty of Paying Living Expenses: Not on file  Food Insecurity: No Food Insecurity  . Worried About Charity fundraiser in the Last Year: Never true  . Ran Out of Food in the Last Year: Never true  Transportation Needs: No Transportation Needs  . Lack of Transportation (Medical): No  . Lack of Transportation (Non-Medical): No  Physical Activity:   . Days of Exercise per Week: Not on file  . Minutes of Exercise per Session: Not on file  Stress:   . Feeling of Stress : Not on file  Social Connections:   . Frequency of Communication with Friends and Family: Not on file  . Frequency of Social Gatherings with Friends and Family: Not on file  . Attends Religious Services: Not on file  . Active Member of Clubs or Organizations: Not on file  . Attends Archivist Meetings: Not on file  . Marital Status: Not on file  Intimate Partner Violence:   . Fear of Current or Ex-Partner: Not on file  . Emotionally Abused: Not on  file  . Physically Abused: Not on file  . Sexually Abused: Not  on file    Past Medical History, Surgical history, Social history, and Family history were reviewed and updated as appropriate.   Please see review of systems for further details on the patient's review from today.   Review of Systems:  Review of Systems  Constitutional: Positive for fatigue. Negative for chills, diaphoresis and fever.  Respiratory: Negative for cough and shortness of breath.   Cardiovascular: Negative for chest pain, palpitations and leg swelling.  Gastrointestinal: Positive for nausea and vomiting. Negative for abdominal pain, constipation and diarrhea.  Genitourinary: Positive for dysuria. Negative for difficulty urinating, flank pain, frequency, hematuria and urgency.  Skin: Negative for rash.  Neurological: Negative for dizziness and headaches.    Objective:   Physical Exam:  BP 105/86 (BP Location: Left Arm, Patient Position: Sitting)   Pulse (!) 111   Temp 98.7 F (37.1 C) (Oral)   Resp 18   Ht 5\' 6"  (1.676 m)   Wt 86.5 kg (190 lb 9.6 oz)   SpO2 100%   BMI 30.76 kg/m  ECOG: 0  Physical Exam Constitutional:      General: She is not in acute distress.    Appearance: She is not diaphoretic.  HENT:     Head: Normocephalic and atraumatic.  Eyes:     General: No scleral icterus.       Right eye: No discharge.        Left eye: No discharge.     Conjunctiva/sclera: Conjunctivae normal.  Cardiovascular:     Rate and Rhythm: Regular rhythm. Tachycardia present.     Heart sounds: Normal heart sounds. No murmur heard.  No friction rub. No gallop.   Pulmonary:     Effort: Pulmonary effort is normal. No respiratory distress.     Breath sounds: Normal breath sounds. No wheezing or rales.  Abdominal:     General: Bowel sounds are normal. There is no distension.     Tenderness: There is no abdominal tenderness. There is no guarding.  Musculoskeletal:     Right lower leg: No edema.     Left  lower leg: No edema.     Comments: No CVA tenderness.  Skin:    General: Skin is warm and dry.     Findings: No erythema or rash.  Neurological:     Mental Status: She is alert.     Coordination: Coordination normal.     Gait: Gait normal.     Lab Review:     Component Value Date/Time   NA 137 04/21/2020 0936   NA 141 10/09/2018 1623   K 3.1 (L) 04/21/2020 0936   CL 103 04/21/2020 0936   CO2 24 04/21/2020 0936   GLUCOSE 115 (H) 04/21/2020 0936   BUN 8 04/21/2020 0936   BUN 12 10/09/2018 1623   CREATININE 0.75 04/21/2020 0936   CREATININE 1.01 11/10/2016 1012   CALCIUM 10.4 (H) 04/21/2020 0936   PROT 8.0 04/21/2020 0936   PROT 7.0 10/09/2018 1623   ALBUMIN 3.5 04/21/2020 0936   ALBUMIN 4.2 10/09/2018 1623   AST 20 04/21/2020 0936   ALT 31 04/21/2020 0936   ALKPHOS 58 04/21/2020 0936   BILITOT 0.4 04/21/2020 0936   GFRNONAA >60 04/21/2020 0936   GFRNONAA 69 11/10/2016 1012   GFRAA >60 04/21/2020 0936   GFRAA 79 11/10/2016 1012       Component Value Date/Time   WBC 3.5 (L) 04/21/2020 0936   WBC 3.5 (L) 04/11/2020 0321   RBC 3.15 (L) 04/21/2020 0938  HGB 9.5 (L) 04/21/2020 0936   HGB 12.9 07/09/2018 0959   HCT 27.5 (L) 04/21/2020 0936   HCT 37.2 07/09/2018 0959   PLT 189 04/21/2020 0936   PLT 356 07/09/2018 0959   MCV 87.3 04/21/2020 0936   MCV 84 07/09/2018 0959   MCH 30.2 04/21/2020 0936   MCHC 34.5 04/21/2020 0936   RDW 15.6 (H) 04/21/2020 0936   RDW 13.7 07/09/2018 0959   LYMPHSABS 0.8 04/21/2020 0936   MONOABS 0.4 04/21/2020 0936   EOSABS 0.0 04/21/2020 0936   BASOSABS 0.0 04/21/2020 0936   -------------------------------  Imaging from last 24 hours (if applicable):  Radiology interpretation: DG Chest 2 View  Result Date: 04/11/2020 CLINICAL DATA:  Fever chemo EXAM: CHEST - 2 VIEW COMPARISON:  March 06, 2020 FINDINGS: The heart size and mediastinal contours are within normal limits. A right-sided MediPort catheter seen with the tip at the superior  cavoatrial junction. Both lungs are clear. The visualized skeletal structures are unremarkable. IMPRESSION: No active cardiopulmonary disease. Electronically Signed   By: Prudencio Pair M.D.   On: 04/11/2020 03:56   CT Soft Tissue Neck W Contrast  Addendum Date: 04/11/2020   ADDENDUM REPORT: 04/11/2020 08:10 ADDENDUM: These results were called by telephone at the time of interpretation on 04/11/2020 at 8:10 am to provider Dr. Eulis Foster, who verbally acknowledged these results. Electronically Signed   By: Kellie Simmering DO   On: 04/11/2020 08:10   Result Date: 04/11/2020 CLINICAL DATA:  Provided history: Neck mass. Additional history provided: Right-sided neck pain and sore throat, patient with history of vulvar cancer in left breast cancer receiving neoadjuvant chemotherapy and radiation for vulvar cancer. EXAM: CT NECK WITH CONTRAST TECHNIQUE: Multidetector CT imaging of the neck was performed using the standard protocol following the bolus administration of intravenous contrast. CONTRAST:  13mL OMNIPAQUE IOHEXOL 300 MG/ML  SOLN COMPARISON:  Head CT 01/01/2012. FINDINGS: Pharynx and larynx: There is no appreciable swelling or discrete mass within the oral cavity, pharynx or larynx. Postinflammatory calcifications within the left palatine tonsil. No retropharyngeal fluid collection is demonstrated. Salivary glands: No inflammation, mass, or stone. Thyroid: Unremarkable. Lymph nodes: No pathologically enlarged cervical chain lymph nodes are identified. Vascular: There is thrombus within the mid to inferior right internal jugular vein with surrounding inflammatory changes (for instance as seen on series 5, image 65). Thrombus likely extends inferiorly along the course of the right IJ approach infusion port catheter. Limited intracranial: No acute intracranial abnormality identified. Visualized orbits: Visualized orbits show no acute finding. Mastoids and visualized paranasal sinuses: Scattered opacity of bilateral ethmoid  air cells. Small left maxillary sinus mucous retention cyst. No significant mastoid effusion. Presumed chronic deformity of the left lamina papyracea. Skeleton: Cervical spondylosis with multilevel disc space narrowing, posterior disc osteophytes, uncovertebral and facet hypertrophy. Upper chest: No consolidation within the imaged lung apices. Right chest infusion port catheter. IMPRESSION: Occlusive thrombus within the mid to inferior right internal jugular vein with thrombus likely extending to surround portions of the right IJ approach infusion port catheter. There are surrounding inflammatory changes within the right neck. No appreciable swelling or discrete mass within the oral cavity, pharynx or larynx. No retropharyngeal fluid collection is demonstrated. Paranasal sinus disease as described Electronically Signed: By: Kellie Simmering DO On: 04/11/2020 08:03

## 2020-05-01 NOTE — Progress Notes (Signed)
[place] CSW Progress Note  Clinical Education officer, museum contacted patient by phone to check-in on coping. Patient has been sick with nausea and vomiting which is being addressed by medical team. This has made her very tired and not wanting to talk much. She is receiving help with things at home from mom (who she lives with) and her kids who come over to help. Will be seeing psychiatrist again in October.   CSW will continue to follow periodically during treatment for coping support.   Edwinna Areola Bekim Werntz , LCSW

## 2020-05-04 ENCOUNTER — Inpatient Hospital Stay: Payer: No Typology Code available for payment source

## 2020-05-04 ENCOUNTER — Ambulatory Visit: Payer: No Typology Code available for payment source

## 2020-05-04 ENCOUNTER — Other Ambulatory Visit: Payer: Self-pay

## 2020-05-04 ENCOUNTER — Ambulatory Visit
Admission: RE | Admit: 2020-05-04 | Discharge: 2020-05-04 | Disposition: A | Discharge status: Home / Self Care | Payer: No Typology Code available for payment source | Source: Ambulatory Visit | Attending: Radiation Oncology | Admitting: Radiation Oncology

## 2020-05-04 VITALS — BP 119/67 | HR 107 | Temp 99.0°F | Resp 17

## 2020-05-04 DIAGNOSIS — Z5111 Encounter for antineoplastic chemotherapy: Secondary | ICD-10-CM | POA: Diagnosis not present

## 2020-05-04 DIAGNOSIS — C519 Malignant neoplasm of vulva, unspecified: Secondary | ICD-10-CM

## 2020-05-04 MED ORDER — DIPHENHYDRAMINE HCL 50 MG/ML IJ SOLN
INTRAMUSCULAR | Status: AC
  Filled 2020-05-04: qty 1

## 2020-05-04 MED ORDER — ACETAMINOPHEN 325 MG PO TABS
650.0000 mg | ORAL_TABLET | Freq: Once | ORAL | Status: AC
  Administered 2020-05-04: 650 mg via ORAL

## 2020-05-04 MED ORDER — ACETAMINOPHEN 325 MG PO TABS
ORAL_TABLET | ORAL | Status: AC
  Filled 2020-05-04: qty 2

## 2020-05-04 MED ORDER — PROCHLORPERAZINE MALEATE 10 MG PO TABS
10.0000 mg | ORAL_TABLET | Freq: Once | ORAL | Status: AC
  Administered 2020-05-04: 10 mg via ORAL

## 2020-05-04 MED ORDER — SODIUM CHLORIDE 0.9% FLUSH
10.0000 mL | INTRAVENOUS | Status: DC | PRN
  Administered 2020-05-04: 10 mL
  Filled 2020-05-04: qty 10

## 2020-05-04 MED ORDER — HEPARIN SOD (PORK) LOCK FLUSH 100 UNIT/ML IV SOLN
500.0000 [IU] | Freq: Once | INTRAVENOUS | Status: AC | PRN
  Administered 2020-05-04: 500 [IU]
  Filled 2020-05-04: qty 5

## 2020-05-04 MED ORDER — SODIUM CHLORIDE 0.9 % IV SOLN
Freq: Once | INTRAVENOUS | Status: AC
  Filled 2020-05-04: qty 250

## 2020-05-04 MED ORDER — TRASTUZUMAB-ANNS CHEMO 150 MG IV SOLR
2.0000 mg/kg | Freq: Once | INTRAVENOUS | Status: AC
  Administered 2020-05-04: 189 mg via INTRAVENOUS
  Filled 2020-05-04: qty 9

## 2020-05-04 MED ORDER — DIPHENHYDRAMINE HCL 50 MG/ML IJ SOLN
25.0000 mg | Freq: Once | INTRAMUSCULAR | Status: AC
  Administered 2020-05-04: 25 mg via INTRAVENOUS

## 2020-05-04 MED ORDER — PROCHLORPERAZINE MALEATE 10 MG PO TABS
ORAL_TABLET | ORAL | Status: AC
  Filled 2020-05-04: qty 1

## 2020-05-04 NOTE — Progress Notes (Signed)
Okay to treat with elevated pulse per Dr. Lindi Adie

## 2020-05-05 ENCOUNTER — Other Ambulatory Visit: Payer: Self-pay

## 2020-05-05 ENCOUNTER — Ambulatory Visit
Admission: RE | Admit: 2020-05-05 | Discharge: 2020-05-05 | Disposition: A | Discharge status: Home / Self Care | Payer: No Typology Code available for payment source | Source: Ambulatory Visit | Attending: Radiation Oncology | Admitting: Radiation Oncology

## 2020-05-05 ENCOUNTER — Ambulatory Visit: Payer: No Typology Code available for payment source

## 2020-05-05 ENCOUNTER — Other Ambulatory Visit: Payer: Self-pay | Admitting: Hematology and Oncology

## 2020-05-05 ENCOUNTER — Encounter: Payer: Self-pay | Admitting: *Deleted

## 2020-05-05 DIAGNOSIS — C50212 Malignant neoplasm of upper-inner quadrant of left female breast: Secondary | ICD-10-CM

## 2020-05-05 DIAGNOSIS — Z170 Estrogen receptor positive status [ER+]: Secondary | ICD-10-CM

## 2020-05-05 DIAGNOSIS — Z5111 Encounter for antineoplastic chemotherapy: Secondary | ICD-10-CM | POA: Diagnosis not present

## 2020-05-05 NOTE — Progress Notes (Signed)
DISCONTINUE OFF PATHWAY REGIMEN - Other   OFF02534:Carboplatin + Paclitaxel (2/50) + RT weekly x 6 weeks:   Administer weekly during RT:     Paclitaxel      Carboplatin   **Always confirm dose/schedule in your pharmacy ordering system**  REASON: Other Reason PRIOR TREATMENT: Carboplatin + Paclitaxel (2/50) + RT weekly x 6 weeks TREATMENT RESPONSE: Unable to Evaluate  START OFF PATHWAY REGIMEN - Other   [id number]:Docetaxel  + Carboplatin + Trastuzumab + Pertuzumab (TCHP) q21 Days:   A cycle is every 21 days:     Pertuzumab      Pertuzumab      Trastuzumab-xxxx      Trastuzumab-xxxx      Carboplatin      Docetaxel   **Always confirm dose/schedule in your pharmacy ordering system**  Patient Characteristics: Intent of Therapy: Curative Intent, Discussed with Patient

## 2020-05-05 NOTE — Progress Notes (Signed)
OFF PATHWAY REGIMEN - Other  No Change  Continue With Treatment as Ordered.  Original Decision Date/Time: 02/27/2020 12:04   [id number]:Carboplatin + Paclitaxel (2/50) + RT weekly x 6 weeks:   Administer weekly during RT:     Paclitaxel      Carboplatin   **Always confirm dose/schedule in your pharmacy ordering system**  Patient Characteristics: Intent of Therapy: Curative Intent, Discussed with Patient

## 2020-05-06 ENCOUNTER — Ambulatory Visit: Payer: No Typology Code available for payment source

## 2020-05-06 ENCOUNTER — Ambulatory Visit
Admission: RE | Admit: 2020-05-06 | Discharge: 2020-05-06 | Disposition: A | Discharge status: Home / Self Care | Payer: No Typology Code available for payment source | Source: Ambulatory Visit | Attending: Radiation Oncology | Admitting: Radiation Oncology

## 2020-05-06 ENCOUNTER — Other Ambulatory Visit: Payer: Self-pay

## 2020-05-06 DIAGNOSIS — Z510 Encounter for antineoplastic radiation therapy: Secondary | ICD-10-CM | POA: Insufficient documentation

## 2020-05-06 DIAGNOSIS — C519 Malignant neoplasm of vulva, unspecified: Secondary | ICD-10-CM | POA: Insufficient documentation

## 2020-05-06 LAB — GLUCOSE, CAPILLARY: Glucose-Capillary: 120 mg/dL — ABNORMAL HIGH (ref 70–99)

## 2020-05-07 ENCOUNTER — Other Ambulatory Visit: Payer: Self-pay | Admitting: Medical

## 2020-05-07 ENCOUNTER — Ambulatory Visit: Payer: No Typology Code available for payment source

## 2020-05-07 ENCOUNTER — Ambulatory Visit
Admission: RE | Admit: 2020-05-07 | Discharge: 2020-05-07 | Disposition: A | Discharge status: Home / Self Care | Payer: No Typology Code available for payment source | Source: Ambulatory Visit | Attending: Radiation Oncology | Admitting: Radiation Oncology

## 2020-05-07 ENCOUNTER — Other Ambulatory Visit: Payer: Self-pay | Admitting: Hematology and Oncology

## 2020-05-07 DIAGNOSIS — R197 Diarrhea, unspecified: Secondary | ICD-10-CM

## 2020-05-07 MED ORDER — RIVAROXABAN 20 MG PO TABS: 20.0000 mg | ORAL_TABLET | Freq: Every day | ORAL | 6 refills | Status: DC

## 2020-05-07 MED FILL — LORazepam 1 MG TABS: 1 | 30 days supply | Qty: 90 | Fill #1

## 2020-05-07 MED FILL — PROMETHAZINE 25 MG TABLET: 25 | 7 days supply | Qty: 30 | Fill #1

## 2020-05-07 MED FILL — XARELTO 20 MG TABLET: 20 | 30 days supply | Qty: 30 | Fill #0

## 2020-05-08 ENCOUNTER — Other Ambulatory Visit: Payer: Self-pay

## 2020-05-08 ENCOUNTER — Encounter: Payer: Self-pay | Admitting: Radiation Oncology

## 2020-05-08 ENCOUNTER — Ambulatory Visit
Admission: RE | Admit: 2020-05-08 | Discharge: 2020-05-08 | Disposition: A | Discharge status: Home / Self Care | Payer: No Typology Code available for payment source | Source: Ambulatory Visit | Attending: Radiation Oncology | Admitting: Radiation Oncology

## 2020-05-13 MED FILL — DIPHENOXYLATE-ATROPINE 2.5-: 2.5-0.025 | 5 days supply | Qty: 45 | Fill #0

## 2020-05-13 NOTE — Telephone Encounter (Signed)
Please refill or refused. Gardiner Rhyme, RN

## 2020-05-15 ENCOUNTER — Other Ambulatory Visit: Payer: Self-pay | Admitting: Medical

## 2020-05-15 DIAGNOSIS — R197 Diarrhea, unspecified: Secondary | ICD-10-CM

## 2020-05-15 MED ORDER — DIPHENOXYLATE-ATROPINE 2.5-0.025 MG PO TABS: ORAL_TABLET | ORAL | 1 refills | Status: DC

## 2020-05-18 ENCOUNTER — Encounter: Payer: Self-pay | Admitting: *Deleted

## 2020-05-18 NOTE — Progress Notes (Signed)
[place] Work  Holiday representative received phone call from patient stating she no longer had insurance as of 05/15/20.  Patient stated her employer informed her she was considered a non-working employee; therefore no longer qualified for insurance benefits.  Patient stated she was unable to afford COBRA.  Patient has applied for medicaid and social security disability.  CSW team has been following patient and is aware of patients concerns.  CSW and patient discussed [employer] in [place].  Patient stated she has not applied for these resources.  CSW will print out applications and leave for patient at the front desk.  CSW and patient briefly discussed documents requested by applications.  Patient plans to pick up applications tomorrow and will begin collecting requested documentation.    Johnnye Lana, MSW, LCSW, OSW-C Clinical Social Worker [facility] [phone]

## 2020-05-19 ENCOUNTER — Other Ambulatory Visit: Payer: No Typology Code available for payment source

## 2020-05-19 ENCOUNTER — Encounter: Payer: Self-pay | Admitting: *Deleted

## 2020-05-19 ENCOUNTER — Ambulatory Visit: Payer: No Typology Code available for payment source

## 2020-05-19 ENCOUNTER — Ambulatory Visit: Payer: No Typology Code available for payment source | Admitting: Hematology and Oncology

## 2020-05-19 MED FILL — PROMETHAZINE 25 MG TABLET: 25 | 7 days supply | Qty: 30 | Fill #2

## 2020-05-25 MED FILL — Dexamethasone Sodium Phosphate Inj 100 MG/10ML: INTRAMUSCULAR | Qty: 1 | Status: AC

## 2020-05-25 MED FILL — Fosaprepitant Dimeglumine For IV Infusion 150 MG (Base Eq): INTRAVENOUS | Qty: 5 | Status: AC

## 2020-05-25 NOTE — Progress Notes (Signed)
Patient Care Team: Rutherford Guys, MD as PCP - General (Family Medicine) Mauro Kaufmann, RN as Oncology Nurse Navigator Rockwell Germany, RN as Oncology Nurse Navigator Nicholas Lose, MD as Consulting Physician (Hematology and Oncology) Alphonsa Overall, MD as Consulting Physician (General Surgery) Awanda Mink Craige Cotta, RN as Oncology Nurse Navigator (Oncology)  DIAGNOSIS:    ICD-10-CM   1. Vulvar cancer ([place])  C51.9   2. Malignant neoplasm of upper-inner quadrant of left breast in female, estrogen receptor positive ([place])  C50.212    Z17.0   3. Diarrhea, unspecified type  R19.7 diphenoxylate-atropine (LOMOTIL) 2.5-0.025 MG tablet    SUMMARY OF ONCOLOGIC HISTORY: Oncology History  Vulvar cancer ([place])  01/29/2020 Initial Diagnosis   Vulvar cancer ([place])   03/11/2020 - 05/04/2020 Chemotherapy   The patient had dexamethasone (DECADRON) 4 MG tablet, 4 mg (100 % of original dose 4 mg), Oral, Daily, 1 of 1 cycle, Start date: 02/27/2020, End date: 05/05/2020 Dose modification: 4 mg (original dose 4 mg, Cycle 0) palonosetron (ALOXI) injection 0.25 mg, 0.25 mg, Intravenous,  Once, 1 of 1 cycle Administration: 0.25 mg (03/11/2020), 0.25 mg (03/17/2020), 0.25 mg (03/24/2020), 0.25 mg (03/31/2020), 0.25 mg (04/07/2020), 0.25 mg (04/14/2020), 0.25 mg (04/21/2020) CARBOplatin (PARAPLATIN) 300 mg in sodium chloride 0.9 % 250 mL chemo infusion, 300 mg (100 % of original dose 300 mg), Intravenous,  Once, 1 of 1 cycle Dose modification:   (original dose 300 mg, Cycle 1) Administration: 300 mg (03/11/2020), 290 mg (03/17/2020), 300 mg (03/24/2020), 230 mg (03/31/2020), 230 mg (04/07/2020), 230 mg (04/14/2020) PACLitaxel (TAXOL) 108 mg in sodium chloride 0.9 % 250 mL chemo infusion (</= 53m/m2), 50 mg/m2 = 108 mg, Intravenous,  Once, 1 of 1 cycle Dose modification: 45 mg/m2 (original dose 50 mg/m2, Cycle 1, Reason: Dose not tolerated) Administration: 108 mg (03/11/2020), 108 mg (03/17/2020), 108 mg (03/24/2020), 96 mg (03/31/2020), 96  mg (04/07/2020), 96 mg (04/14/2020) fosaprepitant (EMEND) 150 mg in sodium chloride 0.9 % 145 mL IVPB, 150 mg, Intravenous,  Once, 1 of 1 cycle Administration: 150 mg (03/24/2020), 150 mg (03/31/2020), 150 mg (04/07/2020), 150 mg (04/14/2020) trastuzumab-anns (KANJINTI) 378 mg in sodium chloride 0.9 % 250 mL chemo infusion, 4 mg/kg = 378 mg (100 % of original dose 4 mg/kg), Intravenous,  Once, 1 of 1 cycle Dose modification: 4 mg/kg (original dose 4 mg/kg, Cycle 1, Reason: Other (see comments), Comment: loading dose) Administration: 378 mg (03/11/2020), 189 mg (03/17/2020), 189 mg (03/24/2020), 189 mg (03/31/2020), 189 mg (04/07/2020), 189 mg (04/14/2020), 189 mg (04/21/2020), 189 mg (05/04/2020)  for chemotherapy treatment.    Malignant neoplasm of upper-inner quadrant of left breast in female, estrogen receptor positive ([place]  02/20/2020 Initial Diagnosis   PET scan on 02/07/20 following a diagnosis of vulvar cancer showed a 1.7cm left breast mass and mildly hypermetabolic left axillary lymph nodes. [place] showed a 3.1cm left breast mass at the 11 o'clock position, palpable on exam, and a single abnormal left axillary lymph node with cortical thickening. Left breast biopsy on 02/20/20 showed invasive and in situ carcinoma in the breast and axilla, grade 3, HER-2 positive (3+), ER/PR+ 95%, Ki67 75%   02/25/2020 Cancer Staging   Staging form: Breast, AJCC 8th Edition - Clinical stage from 02/25/2020: Stage IB (cT2, cN1, cM0, G3, ER+, PR+, HER2+) - Signed by GNicholas Lose MD on 02/25/2020   05/26/2020 -  Chemotherapy   The patient had palonosetron (ALOXI) injection 0.25 mg, 0.25 mg, Intravenous,  Once, 0  of 3 cycles pegfilgrastim-jmdb (FULPHILA) injection 6 mg, 6 mg, Subcutaneous,  Once, 0 of 3 cycles CARBOplatin (PARAPLATIN) 580 mg in sodium chloride 0.9 % 250 mL chemo infusion, 580 mg (100 % of original dose 580 mg), Intravenous,  Once, 0 of 3 cycles Dose modification:   (original dose 580 mg, Cycle  1) DOCEtaxel (TAXOTERE) 120 mg in sodium chloride 0.9 % 250 mL chemo infusion, 60 mg/m2 = 120 mg (100 % of original dose 60 mg/m2), Intravenous,  Once, 0 of 3 cycles Dose modification: 60 mg/m2 (original dose 60 mg/m2, Cycle 1, Reason: Provider Judgment) fosaprepitant (EMEND) 150 mg in sodium chloride 0.9 % 145 mL IVPB, 150 mg, Intravenous,  Once, 0 of 3 cycles pertuzumab (PERJETA) 420 mg in sodium chloride 0.9 % 250 mL chemo infusion, 420 mg (50 % of original dose 840 mg), Intravenous, Once, 0 of 3 cycles Dose modification: 420 mg (original dose 840 mg, Cycle 1, Reason: Provider Judgment) trastuzumab-dkst (OGIVRI) 525 mg in sodium chloride 0.9 % 250 mL chemo infusion, 6 mg/kg = 525 mg (75 % of original dose 8 mg/kg), Intravenous,  Once, 0 of 3 cycles Dose modification: 6 mg/kg (original dose 8 mg/kg, Cycle 1, Reason: Provider Judgment)  for chemotherapy treatment.      CHIEF COMPLIANT: Cycle1TCH Perjeta  INTERVAL HISTORY: Sandra Miller is a 44 y.o. with above-mentioned history of left breast cancer and vulvar cancercurrently on neoadjuvant chemotherapy treatment with weekly Tira Perjeta, after Taxol and Carboplatin were discontinued due to nausea.She presents to the clinic todayforcycle 1 of 3 of [place].  She is extremely stressed out today because she lost her job and her insurance.  She is applied for [employer] and disability and has not heard from them.  She does not have any income and there are lots of expenses related to her medications. Her major complaint today is bilateral shoulder pain and discomfort.  He is not able to push on her arms because of intense pain.  Diarrhea is finally under control.  ALLERGIES:  is allergic to depakote [divalproex sodium], minocycline, and aspirin.  MEDICATIONS:  Current Outpatient Medications  Medication Sig Dispense Refill  . acetaminophen (TYLENOL) 500 MG tablet Take 1,000 mg by mouth every 6 (six) hours as needed for moderate pain  or fever.    . diphenoxylate-atropine (LOMOTIL) 2.5-0.025 MG tablet TAKE 1 TO 2 TABLETS BY MOUTH FOUR TIMES DAILY AS NEEDED FOR DIARRHEA 45 tablet 1  . gabapentin (NEURONTIN) 300 MG capsule Take 1 capsule (300 mg total) by mouth daily. 90 capsule 3  . lidocaine (XYLOCAINE) 5 % ointment Apply 1 application topically 3 (three) times daily as needed. To affected area 35.44 g 0  . lidocaine-prilocaine (EMLA) cream Apply 1 application topically as needed (to port).    Marland Kitchen lisinopril-hydrochlorothiazide (ZESTORETIC) 20-12.5 MG tablet Take 1 tablet by mouth daily. 90 tablet 1  . LORazepam (ATIVAN) 1 MG tablet Take 1 tablet (1 mg total) by mouth 3 (three) times daily as needed for anxiety. 90 tablet 2  . Multiple Vitamin (MULTIVITAMIN) tablet Take 1 tablet by mouth daily.    Marland Kitchen OLANZapine (ZYPREXA) 15 MG tablet Take 1 tablet (15 mg total) by mouth at bedtime. 90 tablet 0  . omeprazole (PRILOSEC) 40 MG capsule TAKE 1 CAPSULE BY MOUTH TWO TIMES DAILY BEFORE A MEAL 180 capsule 0  . potassium chloride SA (KLOR-CON) 20 MEQ tablet Take 1 tablet (20 mEq total) by mouth 2 (two) times daily. 60 tablet 2  .  Probiotic Product (PROBIOTIC DAILY PO) Take 1 tablet by mouth daily.     . promethazine (PHENERGAN) 25 MG tablet Take 1 tablet (25 mg total) by mouth every 6 (six) hours as needed for nausea. 30 tablet 3  . rivaroxaban (XARELTO) 20 MG TABS tablet Take 1 tablet (20 mg total) by mouth daily with supper. 30 tablet 6  . topiramate (TOPAMAX) 25 MG tablet TAKE 1 TABLET BY MOUTH AT BEDTIME (Patient taking differently: Take 25 mg by mouth at bedtime. ) 90 tablet 0  . traZODone (DESYREL) 100 MG tablet Take 1 tablet (100 mg total) by mouth at bedtime. 90 tablet 0  . venlafaxine XR (EFFEXOR XR) 37.5 MG 24 hr capsule Take three capsule (112.5 mg total) by mouth daily. 90 capsule 2   No current facility-administered medications for this visit.   Facility-Administered Medications Ordered in Other Visits  Medication Dose Route  Frequency Provider Last Rate Last Admin  . LORazepam (ATIVAN) injection 0.5 mg  0.5 mg Intravenous Once Harle Stanford., PA-C        PHYSICAL EXAMINATION: ECOG PERFORMANCE STATUS: 1 - Symptomatic but completely ambulatory  Vitals:   05/26/20 1020  BP: 124/79  Pulse: (!) 109  Resp: 20  Temp: 97.7 F (36.5 C)  SpO2: 100%   Filed Weights   05/26/20 1020  Weight: 190 lb 8 oz (86.4 kg)    LABORATORY DATA:  I have reviewed the data as listed CMP Latest Ref Rng & Units 05/26/2020 04/21/2020 04/14/2020  Glucose 70 - 99 mg/dL 106(H) 115(H) 106(H)  BUN 6 - 20 mg/dL 10 8 7   Creatinine 0.44 - 1.00 mg/dL 0.73 0.75 0.73  Sodium 135 - 145 mmol/L 138 137 136  Potassium 3.5 - 5.1 mmol/L 3.3(L) 3.1(L) 2.6(LL)  Chloride 98 - 111 mmol/L 105 103 102  CO2 22 - 32 mmol/L 24 24 22   Calcium 8.9 - 10.3 mg/dL 10.3 10.4(H) 9.1  Total Protein 6.5 - 8.1 g/dL 7.7 8.0 7.3  Total Bilirubin 0.3 - 1.2 mg/dL 0.5 0.4 0.3  Alkaline Phos 38 - 126 U/L 56 58 55  AST 15 - 41 U/L 19 20 23   ALT 0 - 44 U/L 20 31 40    Lab Results  Component Value Date   WBC 6.9 05/26/2020   HGB 10.0 (L) 05/26/2020   HCT 30.8 (L) 05/26/2020   MCV 96.0 05/26/2020   PLT 343 05/26/2020   NEUTROABS 5.1 05/26/2020    ASSESSMENT & PLAN:  Malignant neoplasm of upper-inner quadrant of left breast in female, estrogen receptor positive ([place]) Breast cancer and vulvar cancers  02/20/2020:PET scan on 02/07/20 following a diagnosis of vulvar cancer showed a 1.7cm left breast mass and mildly hypermetabolic left axillary lymph nodes. Mammogram and [place] on 02/18/20 showed a 3.1cm left breast mass at the 11 o'clock position, palpable on exam, and a single abnormal left axillary lymph node with cortical thickening. Left breast biopsy on 02/20/20 showed invasive and in situ carcinoma in the breast and axilla, grade 3, HER-2 positive (3+), ER/PR+ 95%, Ki67 75%  Treatment plan: 1.Neoadjuvant chemoradiation for vulvar cancer with Taxol, carboplatin,  Herceptin weekly x6 cycles 2.neoadjuvant chemotherapy with TCH Perjeta x3 cycles 3.Evaluation for breast conserving surgery if possible with sentinel lymph node study 3. Followed by adjuvant radiation therapy if patient had lumpectomy ------------------------------------------------------------------------------------------------------------------------------- Current treatment: Completed 6 cycles ofTaxol carboplatin Herceptin, weeklywith radiation   cycle 1 of TCH Perjeta (plan to give 3 cycles) will be given 05/29/2020 Echocardiogram: 02/23/2020: EF 55  to 60%  Chemo toxicities: Chemotherapy-induced nausea: Managed with antiemetics. Leukopenia: Monitoring closely Diarrhea: Currently on Lomotil and Imodium. Severe fatigue: Now that the radiation component is over she is going to receive neoadjuvant chemo for breast cancer. Hypokalemia: Currently on potassium replacement therapy. Muscle pains: Probably related to electrolyte imbalance.  Return to clinic in 1 week for toxicity check    No orders of the defined types were placed in this encounter.  The patient has a good understanding of the overall plan. she agrees with it. she will call with any problems that may develop before the next visit here.  Total time spent: 30 mins including face to face time and time spent for planning, charting and coordination of care  Nicholas Lose, MD 05/26/2020  I, Cloyde Reams Dorshimer, am acting as scribe for Dr. Nicholas Lose.  I have reviewed the above documentation for accuracy and completeness, and I agree with the above.

## 2020-05-26 ENCOUNTER — Inpatient Hospital Stay
Payer: No Typology Code available for payment source | Attending: Gynecologic Oncology | Admitting: Hematology and Oncology

## 2020-05-26 ENCOUNTER — Inpatient Hospital Stay: Payer: No Typology Code available for payment source

## 2020-05-26 ENCOUNTER — Other Ambulatory Visit: Payer: Self-pay

## 2020-05-26 ENCOUNTER — Inpatient Hospital Stay: Payer: No Typology Code available for payment source | Admitting: Licensed Clinical Social Worker

## 2020-05-26 ENCOUNTER — Encounter: Payer: Self-pay | Admitting: *Deleted

## 2020-05-26 ENCOUNTER — Other Ambulatory Visit: Payer: Self-pay | Admitting: Hematology and Oncology

## 2020-05-26 VITALS — BP 124/79 | HR 109 | Temp 97.7°F | Resp 20 | Ht 66.0 in | Wt 190.5 lb

## 2020-05-26 DIAGNOSIS — C50212 Malignant neoplasm of upper-inner quadrant of left female breast: Secondary | ICD-10-CM | POA: Diagnosis not present

## 2020-05-26 DIAGNOSIS — Z5112 Encounter for antineoplastic immunotherapy: Secondary | ICD-10-CM | POA: Insufficient documentation

## 2020-05-26 DIAGNOSIS — Z170 Estrogen receptor positive status [ER+]: Secondary | ICD-10-CM | POA: Diagnosis not present

## 2020-05-26 DIAGNOSIS — C519 Malignant neoplasm of vulva, unspecified: Secondary | ICD-10-CM | POA: Diagnosis not present

## 2020-05-26 DIAGNOSIS — M791 Myalgia, unspecified site: Secondary | ICD-10-CM | POA: Diagnosis not present

## 2020-05-26 DIAGNOSIS — Z5111 Encounter for antineoplastic chemotherapy: Secondary | ICD-10-CM | POA: Diagnosis present

## 2020-05-26 DIAGNOSIS — R530 Neoplastic (malignant) related fatigue: Secondary | ICD-10-CM | POA: Diagnosis not present

## 2020-05-26 DIAGNOSIS — R197 Diarrhea, unspecified: Secondary | ICD-10-CM | POA: Diagnosis not present

## 2020-05-26 DIAGNOSIS — Z79899 Other long term (current) drug therapy: Secondary | ICD-10-CM | POA: Insufficient documentation

## 2020-05-26 DIAGNOSIS — M25511 Pain in right shoulder: Secondary | ICD-10-CM | POA: Insufficient documentation

## 2020-05-26 DIAGNOSIS — D72819 Decreased white blood cell count, unspecified: Secondary | ICD-10-CM | POA: Diagnosis not present

## 2020-05-26 DIAGNOSIS — E876 Hypokalemia: Secondary | ICD-10-CM | POA: Diagnosis not present

## 2020-05-26 DIAGNOSIS — M25512 Pain in left shoulder: Secondary | ICD-10-CM | POA: Insufficient documentation

## 2020-05-26 DIAGNOSIS — Z95828 Presence of other vascular implants and grafts: Secondary | ICD-10-CM

## 2020-05-26 DIAGNOSIS — R110 Nausea: Secondary | ICD-10-CM | POA: Insufficient documentation

## 2020-05-26 LAB — CBC WITH DIFFERENTIAL (CANCER CENTER ONLY)
Abs Immature Granulocytes: 0.05 10*3/uL (ref 0.00–0.07)
Basophils Absolute: 0 10*3/uL (ref 0.0–0.1)
Basophils Relative: 0 %
Eosinophils Absolute: 0 10*3/uL (ref 0.0–0.5)
Eosinophils Relative: 0 %
HCT: 30.8 % — ABNORMAL LOW (ref 36.0–46.0)
Hemoglobin: 10 g/dL — ABNORMAL LOW (ref 12.0–15.0)
Immature Granulocytes: 1 %
Lymphocytes Relative: 17 %
Lymphs Abs: 1.2 10*3/uL (ref 0.7–4.0)
MCH: 31.2 pg (ref 26.0–34.0)
MCHC: 32.5 g/dL (ref 30.0–36.0)
MCV: 96 fL (ref 80.0–100.0)
Monocytes Absolute: 0.5 10*3/uL (ref 0.1–1.0)
Monocytes Relative: 8 %
Neutro Abs: 5.1 10*3/uL (ref 1.7–7.7)
Neutrophils Relative %: 74 %
Platelet Count: 343 10*3/uL (ref 150–400)
RBC: 3.21 MIL/uL — ABNORMAL LOW (ref 3.87–5.11)
RDW: 19.4 % — ABNORMAL HIGH (ref 11.5–15.5)
WBC Count: 6.9 10*3/uL (ref 4.0–10.5)
nRBC: 0 % (ref 0.0–0.2)

## 2020-05-26 LAB — CMP (CANCER CENTER ONLY)
ALT: 20 U/L (ref 0–44)
AST: 19 U/L (ref 15–41)
Albumin: 3.7 g/dL (ref 3.5–5.0)
Alkaline Phosphatase: 56 U/L (ref 38–126)
Anion gap: 9 (ref 5–15)
BUN: 10 mg/dL (ref 6–20)
CO2: 24 mmol/L (ref 22–32)
Calcium: 10.3 mg/dL (ref 8.9–10.3)
Chloride: 105 mmol/L (ref 98–111)
Creatinine: 0.73 mg/dL (ref 0.44–1.00)
GFR, Est AFR Am: >60 mL/min (ref >60)
GFR, Estimated: >60 mL/min (ref >60)
Glucose, Bld: 106 mg/dL — ABNORMAL HIGH (ref 70–99)
Potassium: 3.3 mmol/L — ABNORMAL LOW (ref 3.5–5.1)
Sodium: 138 mmol/L (ref 135–145)
Total Bilirubin: 0.5 mg/dL (ref 0.3–1.2)
Total Protein: 7.7 g/dL (ref 6.5–8.1)

## 2020-05-26 MED ORDER — POTASSIUM CHLORIDE CRYS ER 20 MEQ PO TBCR: 20.0000 meq | EXTENDED_RELEASE_TABLET | Freq: Two times a day (BID) | ORAL | 2 refills | Status: DC

## 2020-05-26 MED ORDER — HEPARIN SOD (PORK) LOCK FLUSH 100 UNIT/ML IV SOLN
500.0000 [IU] | Freq: Once | INTRAVENOUS | Status: AC
  Administered 2020-05-26: 500 [IU]
  Filled 2020-05-26: qty 5

## 2020-05-26 MED ORDER — SODIUM CHLORIDE 0.9% FLUSH
10.0000 mL | Freq: Once | INTRAVENOUS | Status: AC
  Administered 2020-05-26: 10 mL
  Filled 2020-05-26: qty 10

## 2020-05-26 MED ORDER — DIPHENOXYLATE-ATROPINE 2.5-0.025 MG PO TABS: ORAL_TABLET | ORAL | 1 refills | Status: DC

## 2020-05-26 MED FILL — DIPHENOXYLATE-ATROPINE 2.5-: 2.5-0.025 | 6 days supply | Qty: 45 | Fill #0

## 2020-05-26 NOTE — Progress Notes (Signed)
Nutrition Follow-up:  Patient with vulvar and breast cancer.  Patient has completed neoadjuvant chemoradiation for vulvar cancer.  Planning to start [facility] perjeta for 3 cycles today but held today until Friday 9/24  Met with patient in clinic today.  Patient reports increased stress due to loss of insurance.  Has met with Education officer, museum today as well.  Reports that appetite has been better over the last 3 weeks with not being on treatment.  Was hoping that she had gained weight.  Has been eating chicken noodle soup and cobb salad recently.  Sister has purchased ensure clear shakes for her and should be delivered today.       Medications: reviewed  Labs: reviewed  Anthropometrics:   Weight 190 lb 8 oz today stable from weight on 8/25 but decreased from 199 lb on 8/17   NUTRITION DIAGNOSIS: Inadequate oral intake stable   INTERVENTION:  Encouraged adding more chicken to canned soups if able to tolerate for increased protein Encouraged small frequent meals Patient has purchased easy to prepare meals and has them on hand for when chemotherapy starts.      MONITORING, EVALUATION, GOAL: weight trends, intake   NEXT VISIT: to be determined  Carigan Lister B. Zenia Resides, [place], [place] Registered Dietitian [phone] (mobile)

## 2020-05-26 NOTE — Assessment & Plan Note (Signed)
Breast cancer and vulvar cancers  02/20/2020:PET scan on 02/07/20 following a diagnosis of vulvar cancer showed a 1.7cm left breast mass and mildly hypermetabolic left axillary lymph nodes. Mammogram and [place] on 02/18/20 showed a 3.1cm left breast mass at the 11 o'clock position, palpable on exam, and a single abnormal left axillary lymph node with cortical thickening. Left breast biopsy on 02/20/20 showed invasive and in situ carcinoma in the breast and axilla, grade 3, HER-2 positive (3+), ER/PR+ 95%, Ki67 75%  Treatment plan: 1.Neoadjuvant chemoradiation for vulvar cancer with Taxol, carboplatin, Herceptin weekly x6 cycles 2.neoadjuvant chemotherapy with TCH Perjeta x3 cycles 3.Evaluation for breast conserving surgery if possible with sentinel lymph node study 3. Followed by adjuvant radiation therapy if patient had lumpectomy ------------------------------------------------------------------------------------------------------------------------------- Current treatment: Completed 6 cycles ofTaxol carboplatin Herceptin, weeklywith radiation Today cycle 1 of TCH Perjeta (plan to give 3 cycles) Echocardiogram: 02/23/2020: EF 55 to 60%  Chemo toxicities: Chemotherapy-induced nausea: Managed with antiemetics. Leukopenia: Monitoring closely Diarrhea: Currently on Lomotil and Imodium. Severe fatigue: Now that the radiation component is over she is going to receive neoadjuvant chemo for breast cancer.  Return to clinic in 1 week for toxicity check

## 2020-05-26 NOTE — Patient Instructions (Signed)

## 2020-05-26 NOTE — Progress Notes (Signed)
[place] CSW Progress Note  Clinical Education officer, museum met with patient to follow-up on concerns discussed with CSW [facility] 9/13 regarding discontinuation of insurance benefits. Currently, has sent application for Breast Cancer Medicaid, CSW also encouraged pt to apply for full Medicaid as well, application printed & given today. Patient worked with someone from a church to send in [facility] application. Having issues with the social security office not receiving the information patient is mailing back to them.  Patient has also received the applications for [employer] [place] and [employer] again and is working on Immunologist supporting documents.    Edwinna Areola Annalei Friesz LCSW

## 2020-05-28 ENCOUNTER — Telehealth: Payer: Self-pay | Admitting: Hematology and Oncology

## 2020-05-28 MED FILL — POTASSIUM CHLORIDE CRYS ER: 20 | 30 days supply | Qty: 60 | Fill #0

## 2020-05-28 NOTE — Telephone Encounter (Signed)
Scheduled per 9/21 los. Called and spoke with pt, confirmed added appts

## 2020-05-28 NOTE — Progress Notes (Signed)
Pharmacist Chemotherapy Monitoring - Initial Assessment    Anticipated start date: 05/29/20   Regimen:  . Are orders appropriate based on the patient's diagnosis, regimen, and cycle? Yes . Does the plan date match the patient's scheduled date? Yes . Is the sequencing of drugs appropriate? Yes . Are the premedications appropriate for the patient's regimen? Yes . Prior Authorization for treatment is: Approved o If applicable, is the correct biosimilar selected based on the patient's insurance? yes  Organ Function and Labs: Marland Kitchen Are dose adjustments needed based on the patient's renal function, hepatic function, or hematologic function? No . Are appropriate labs ordered prior to the start of patient's treatment? Yes . Other organ system assessment, if indicated: trastuzumab: Echo/ MUGA and pertuzumab: Echo/ MUGA . The following baseline labs, if indicated, have been ordered: N/A  Dose Assessment: . Are the drug doses appropriate? Yes . Are the following correct: o Drug concentrations Yes o IV fluid compatible with drug Yes o Administration routes Yes o Timing of therapy Yes . If applicable, does the patient have documented access for treatment and/or plans for port-a-cath placement? yes . If applicable, have lifetime cumulative doses been properly documented and assessed? yes Lifetime Dose Tracking  . Carboplatin: 1,580 mg = 0.01 % of the maximum lifetime dose of 999,999,999 mg  o   Toxicity Monitoring/Prevention: . The patient has the following take home antiemetics prescribed: Lorazepam & Phenergan . The patient has the following take home medications prescribed: N/A . Medication allergies and previous infusion related reactions, if applicable, have been reviewed and addressed. Yes . The patient's current medication list has been assessed for drug-drug interactions with their chemotherapy regimen. no significant drug-drug interactions were identified on review.  Order Review: . Are  the treatment plan orders signed? Yes . Is the patient scheduled to see a provider prior to their treatment? No  I verify that I have reviewed each item in the above checklist and answered each question accordingly.   Kennith Center, Pharm.D., CPP 05/28/2020@4 :02 PM

## 2020-05-29 ENCOUNTER — Inpatient Hospital Stay: Payer: No Typology Code available for payment source

## 2020-05-29 ENCOUNTER — Other Ambulatory Visit: Payer: Self-pay

## 2020-05-29 ENCOUNTER — Encounter: Payer: Self-pay | Admitting: *Deleted

## 2020-05-29 VITALS — BP 140/89 | HR 96 | Temp 98.4°F | Resp 20

## 2020-05-29 DIAGNOSIS — Z5111 Encounter for antineoplastic chemotherapy: Secondary | ICD-10-CM | POA: Diagnosis not present

## 2020-05-29 DIAGNOSIS — C50212 Malignant neoplasm of upper-inner quadrant of left female breast: Secondary | ICD-10-CM

## 2020-05-29 MED ORDER — ACETAMINOPHEN 325 MG PO TABS
ORAL_TABLET | ORAL | Status: AC
  Filled 2020-05-29: qty 2

## 2020-05-29 MED ORDER — HEPARIN SOD (PORK) LOCK FLUSH 100 UNIT/ML IV SOLN
500.0000 [IU] | Freq: Once | INTRAVENOUS | Status: AC | PRN
  Administered 2020-05-29: 500 [IU]
  Filled 2020-05-29: qty 5

## 2020-05-29 MED ORDER — DIPHENHYDRAMINE HCL 25 MG PO CAPS
ORAL_CAPSULE | ORAL | Status: AC
  Filled 2020-05-29: qty 2

## 2020-05-29 MED ORDER — SODIUM CHLORIDE 0.9 % IV SOLN
10.0000 mg | Freq: Once | INTRAVENOUS | Status: AC
  Administered 2020-05-29: 10 mg via INTRAVENOUS
  Filled 2020-05-29: qty 10

## 2020-05-29 MED ORDER — ACETAMINOPHEN 325 MG PO TABS
650.0000 mg | ORAL_TABLET | Freq: Once | ORAL | Status: AC
  Administered 2020-05-29: 650 mg via ORAL

## 2020-05-29 MED ORDER — SODIUM CHLORIDE 0.9 % IV SOLN
60.0000 mg/m2 | Freq: Once | INTRAVENOUS | Status: AC
  Administered 2020-05-29: 120 mg via INTRAVENOUS
  Filled 2020-05-29: qty 12

## 2020-05-29 MED ORDER — PALONOSETRON HCL INJECTION 0.25 MG/5ML
INTRAVENOUS | Status: AC
  Filled 2020-05-29: qty 5

## 2020-05-29 MED ORDER — TRASTUZUMAB-DKST CHEMO 150 MG IV SOLR
6.0000 mg/kg | Freq: Once | INTRAVENOUS | Status: AC
  Administered 2020-05-29: 525 mg via INTRAVENOUS
  Filled 2020-05-29: qty 25

## 2020-05-29 MED ORDER — SODIUM CHLORIDE 0.9 % IV SOLN
420.0000 mg | Freq: Once | INTRAVENOUS | Status: AC
  Administered 2020-05-29: 420 mg via INTRAVENOUS
  Filled 2020-05-29: qty 14

## 2020-05-29 MED ORDER — SODIUM CHLORIDE 0.9 % IV SOLN
Freq: Once | INTRAVENOUS | Status: AC
  Filled 2020-05-29: qty 250

## 2020-05-29 MED ORDER — SODIUM CHLORIDE 0.9 % IV SOLN
150.0000 mg | Freq: Once | INTRAVENOUS | Status: AC
  Administered 2020-05-29: 150 mg via INTRAVENOUS
  Filled 2020-05-29: qty 150

## 2020-05-29 MED ORDER — SODIUM CHLORIDE 0.9 % IV SOLN
580.0000 mg | Freq: Once | INTRAVENOUS | Status: AC
  Administered 2020-05-29: 580 mg via INTRAVENOUS
  Filled 2020-05-29: qty 58

## 2020-05-29 MED ORDER — PALONOSETRON HCL INJECTION 0.25 MG/5ML
0.2500 mg | Freq: Once | INTRAVENOUS | Status: AC
  Administered 2020-05-29: 0.25 mg via INTRAVENOUS

## 2020-05-29 MED ORDER — SODIUM CHLORIDE 0.9% FLUSH
10.0000 mL | INTRAVENOUS | Status: DC | PRN
  Administered 2020-05-29: 10 mL
  Filled 2020-05-29: qty 10

## 2020-05-29 MED ORDER — DIPHENHYDRAMINE HCL 25 MG PO CAPS
50.0000 mg | ORAL_CAPSULE | Freq: Once | ORAL | Status: AC
  Administered 2020-05-29: 50 mg via ORAL

## 2020-05-29 NOTE — Patient Instructions (Addendum)
[facility] Discharge Instructions for Patients Receiving Chemotherapy  Today you received the following chemotherapy agents Trastuzumab, Pertuzumab, Taxotere, Carboplatin.   To help prevent nausea and vomiting after your treatment, we encourage you to take your nausea medication as directed.  If you develop nausea and vomiting that is not controlled by your nausea medication, call the clinic.   BELOW ARE SYMPTOMS THAT SHOULD BE REPORTED IMMEDIATELY:  *FEVER GREATER THAN 100.5 F  *CHILLS WITH OR WITHOUT FEVER  NAUSEA AND VOMITING THAT IS NOT CONTROLLED WITH YOUR NAUSEA MEDICATION  *UNUSUAL SHORTNESS OF BREATH  *UNUSUAL BRUISING OR BLEEDING  TENDERNESS IN MOUTH AND THROAT WITH OR WITHOUT PRESENCE OF ULCERS  *URINARY PROBLEMS  *BOWEL PROBLEMS  UNUSUAL RASH Items with * indicate a potential emergency and should be followed up as soon as possible.  Feel free to call the clinic should you have any questions or concerns. The clinic phone number is [phone].  Please show the [facility] at check-in to the Emergency Department and triage nurse.  Trastuzumab injection for infusion What is this medicine? TRASTUZUMAB (tras TOO zoo mab) is a monoclonal antibody. It is used to treat breast cancer and stomach cancer. This medicine may be used for other purposes; ask your health care provider or pharmacist if you have questions. COMMON BRAND NAME(S): Herceptin, Galvin Proffer, Trazimera What should I tell my health care provider before I take this medicine? They need to know if you have any of these conditions:  heart disease  heart failure  lung or breathing disease, like asthma  an unusual or allergic reaction to trastuzumab, benzyl alcohol, or other medications, foods, dyes, or preservatives  pregnant or trying to get pregnant  breast-feeding How should I use this medicine? This drug is given as an infusion into a vein. It is  administered in a hospital or clinic by a specially trained health care professional. Talk to your pediatrician regarding the use of this medicine in children. This medicine is not approved for use in children. Overdosage: If you think you have taken too much of this medicine contact a poison control center or emergency room at once. NOTE: This medicine is only for you. Do not share this medicine with others. What if I miss a dose? It is important not to miss a dose. Call your doctor or health care professional if you are unable to keep an appointment. What may interact with this medicine? This medicine may interact with the following medications:  certain types of chemotherapy, such as daunorubicin, doxorubicin, epirubicin, and idarubicin This list may not describe all possible interactions. Give your health care provider a list of all the medicines, herbs, non-prescription drugs, or dietary supplements you use. Also tell them if you smoke, drink alcohol, or use illegal drugs. Some items may interact with your medicine. What should I watch for while using this medicine? Visit your doctor for checks on your progress. Report any side effects. Continue your course of treatment even though you feel ill unless your doctor tells you to stop. Call your doctor or health care professional for advice if you get a fever, chills or sore throat, or other symptoms of a cold or flu. Do not treat yourself. Try to avoid being around people who are sick. You may experience fever, chills and shaking during your first infusion. These effects are usually mild and can be treated with other medicines. Report any side effects during the infusion to your health care professional.  Fever and chills usually do not happen with later infusions. Do not become pregnant while taking this medicine or for 7 months after stopping it. Women should inform their doctor if they wish to become pregnant or think they might be pregnant.  Women of child-bearing potential will need to have a negative pregnancy test before starting this medicine. There is a potential for serious side effects to an unborn child. Talk to your health care professional or pharmacist for more information. Do not breast-feed an infant while taking this medicine or for 7 months after stopping it. Women must use effective birth control with this medicine. What side effects may I notice from receiving this medicine? Side effects that you should report to your doctor or health care professional as soon as possible:  allergic reactions like skin rash, itching or hives, swelling of the face, lips, or tongue  chest pain or palpitations  cough  dizziness  feeling faint or lightheaded, falls  fever  general ill feeling or flu-like symptoms  signs of worsening heart failure like breathing problems; swelling in your legs and feet  unusually weak or tired Side effects that usually do not require medical attention (report to your doctor or health care professional if they continue or are bothersome):  bone pain  changes in taste  diarrhea  joint pain  nausea/vomiting  weight loss This list may not describe all possible side effects. Call your doctor for medical advice about side effects. You may report side effects to FDA at 1-800-FDA-1088. Where should I keep my medicine? This drug is given in a hospital or clinic and will not be stored at home. NOTE: This sheet is a summary. It may not cover all possible information. If you have questions about this medicine, talk to your doctor, pharmacist, or health care provider.  2020 Elsevier/Gold Standard (2016-08-16 14:37:52)  Pertuzumab injection What is this medicine? PERTUZUMAB (per TOOZ ue mab) is a monoclonal antibody. It is used to treat breast cancer. This medicine may be used for other purposes; ask your health care provider or pharmacist if you have questions. COMMON BRAND NAME(S):  PERJETA What should I tell my health care provider before I take this medicine? They need to know if you have any of these conditions:  heart disease  heart failure  high blood pressure  history of irregular heart beat  recent or ongoing radiation therapy  an unusual or allergic reaction to pertuzumab, other medicines, foods, dyes, or preservatives  pregnant or trying to get pregnant  breast-feeding How should I use this medicine? This medicine is for infusion into a vein. It is given by a health care professional in a hospital or clinic setting. Talk to your pediatrician regarding the use of this medicine in children. Special care may be needed. Overdosage: If you think you have taken too much of this medicine contact a poison control center or emergency room at once. NOTE: This medicine is only for you. Do not share this medicine with others. What if I miss a dose? It is important not to miss your dose. Call your doctor or health care professional if you are unable to keep an appointment. What may interact with this medicine? Interactions are not expected. Give your health care provider a list of all the medicines, herbs, non-prescription drugs, or dietary supplements you use. Also tell them if you smoke, drink alcohol, or use illegal drugs. Some items may interact with your medicine. This list may not describe all possible interactions.   Give your health care provider a list of all the medicines, herbs, non-prescription drugs, or dietary supplements you use. Also tell them if you smoke, drink alcohol, or use illegal drugs. Some items may interact with your medicine. What should I watch for while using this medicine? Your condition will be monitored carefully while you are receiving this medicine. Report any side effects. Continue your course of treatment even though you feel ill unless your doctor tells you to stop. Do not become pregnant while taking this medicine or for 7 months after stopping  it. Women should inform their doctor if they wish to become pregnant or think they might be pregnant. Women of child-bearing potential will need to have a negative pregnancy test before starting this medicine. There is a potential for serious side effects to an unborn child. Talk to your health care professional or pharmacist for more information. Do not breast-feed an infant while taking this medicine or for 7 months after stopping it. Women must use effective birth control with this medicine. Call your doctor or health care professional for advice if you get a fever, chills or sore throat, or other symptoms of a cold or flu. Do not treat yourself. Try to avoid being around people who are sick. You may experience fever, chills, and headache during the infusion. Report any side effects during the infusion to your health care professional. What side effects may I notice from receiving this medicine? Side effects that you should report to your doctor or health care professional as soon as possible:  breathing problems  chest pain or palpitations  dizziness  feeling faint or lightheaded  fever or chills  skin rash, itching or hives  sore throat  swelling of the face, lips, or tongue  swelling of the legs or ankles  unusually weak or tired Side effects that usually do not require medical attention (report to your doctor or health care professional if they continue or are bothersome):  diarrhea  hair loss  nausea, vomiting  tiredness This list may not describe all possible side effects. Call your doctor for medical advice about side effects. You may report side effects to FDA at 1-800-FDA-1088. Where should I keep my medicine? This drug is given in a hospital or clinic and will not be stored at home. NOTE: This sheet is a summary. It may not cover all possible information. If you have questions about this medicine, talk to your doctor, pharmacist, or health care provider.  2020  Elsevier/Gold Standard (2015-09-24 12:08:50)  Docetaxel injection What is this medicine? DOCETAXEL (doe se TAX el) is a chemotherapy drug. It targets fast dividing cells, like cancer cells, and causes these cells to die. This medicine is used to treat many types of cancers like breast cancer, certain stomach cancers, head and neck cancer, lung cancer, and prostate cancer. This medicine may be used for other purposes; ask your health care provider or pharmacist if you have questions. COMMON BRAND NAME(S): Docefrez, Taxotere What should I tell my health care provider before I take this medicine? They need to know if you have any of these conditions:  infection (especially a virus infection such as chickenpox, cold sores, or herpes)  liver disease  low blood counts, like low white cell, platelet, or red cell counts  an unusual or allergic reaction to docetaxel, polysorbate 80, other chemotherapy agents, other medicines, foods, dyes, or preservatives  pregnant or trying to get pregnant  breast-feeding How should I use this medicine? This drug is  given as an infusion into a vein. It is administered in a hospital or clinic by a specially trained health care professional. Talk to your pediatrician regarding the use of this medicine in children. Special care may be needed. Overdosage: If you think you have taken too much of this medicine contact a poison control center or emergency room at once. NOTE: This medicine is only for you. Do not share this medicine with others. What if I miss a dose? It is important not to miss your dose. Call your doctor or health care professional if you are unable to keep an appointment. What may interact with this medicine?  aprepitant  certain antibiotics like erythromycin or clarithromycin  certain antivirals for HIV or hepatitis  certain medicines for fungal infections like fluconazole, itraconazole, ketoconazole, posaconazole, or  voriconazole  cimetidine  ciprofloxacin  conivaptan  cyclosporine  dronedarone  fluvoxamine  grapefruit juice  imatinib  verapamil This list may not describe all possible interactions. Give your health care provider a list of all the medicines, herbs, non-prescription drugs, or dietary supplements you use. Also tell them if you smoke, drink alcohol, or use illegal drugs. Some items may interact with your medicine. What should I watch for while using this medicine? Your condition will be monitored carefully while you are receiving this medicine. You will need important blood work done while you are taking this medicine. Call your doctor or health care professional for advice if you get a fever, chills or sore throat, or other symptoms of a cold or flu. Do not treat yourself. This drug decreases your body's ability to fight infections. Try to avoid being around people who are sick. Some products may contain alcohol. Ask your health care professional if this medicine contains alcohol. Be sure to tell all health care professionals you are taking this medicine. Certain medicines, like metronidazole and disulfiram, can cause an unpleasant reaction when taken with alcohol. The reaction includes flushing, headache, nausea, vomiting, sweating, and increased thirst. The reaction can last from 30 minutes to several hours. You may get drowsy or dizzy. Do not drive, use machinery, or do anything that needs mental alertness until you know how this medicine affects you. Do not stand or sit up quickly, especially if you are an older patient. This reduces the risk of dizzy or fainting spells. Alcohol may interfere with the effect of this medicine. Talk to your health care professional about your risk of cancer. You may be more at risk for certain types of cancer if you take this medicine. Do not become pregnant while taking this medicine or for 6 months after stopping it. Women should inform their doctor if  they wish to become pregnant or think they might be pregnant. There is a potential for serious side effects to an unborn child. Talk to your health care professional or pharmacist for more information. Do not breast-feed an infant while taking this medicine or for 1 week after stopping it. Males who get this medicine must use a condom during sex with females who can get pregnant. If you get a woman pregnant, the baby could have birth defects. The baby could die before they are born. You will need to continue wearing a condom for 3 months after stopping the medicine. Tell your health care provider right away if your partner becomes pregnant while you are taking this medicine. This may interfere with the ability to father a child. You should talk to your doctor or health care professional if you   are concerned about your fertility. What side effects may I notice from receiving this medicine? Side effects that you should report to your doctor or health care professional as soon as possible:  allergic reactions like skin rash, itching or hives, swelling of the face, lips, or tongue  blurred vision  breathing problems  changes in vision  low blood counts - This drug may decrease the number of white blood cells, red blood cells and platelets. You may be at increased risk for infections and bleeding.  nausea and vomiting  pain, redness or irritation at site where injected  pain, tingling, numbness in the hands or feet  redness, blistering, peeling, or loosening of the skin, including inside the mouth  signs of decreased platelets or bleeding - bruising, pinpoint red spots on the skin, black, tarry stools, nosebleeds  signs of decreased red blood cells - unusually weak or tired, fainting spells, lightheadedness  signs of infection - fever or chills, cough, sore throat, pain or difficulty passing urine  swelling of the ankle, feet, hands Side effects that usually do not require medical attention  (report to your doctor or health care professional if they continue or are bothersome):  constipation  diarrhea  fingernail or toenail changes  hair loss  loss of appetite  mouth sores  muscle pain This list may not describe all possible side effects. Call your doctor for medical advice about side effects. You may report side effects to FDA at 1-800-FDA-1088. Where should I keep my medicine? This drug is given in a hospital or clinic and will not be stored at home. NOTE: This sheet is a summary. It may not cover all possible information. If you have questions about this medicine, talk to your doctor, pharmacist, or health care provider.  2020 Elsevier/Gold Standard (2019-04-18 10:19:06)   Carboplatin injection What is this medicine? CARBOPLATIN (KAR boe pla tin) is a chemotherapy drug. It targets fast dividing cells, like cancer cells, and causes these cells to die. This medicine is used to treat ovarian cancer and many other cancers. This medicine may be used for other purposes; ask your health care provider or pharmacist if you have questions. COMMON BRAND NAME(S): Paraplatin What should I tell my health care provider before I take this medicine? They need to know if you have any of these conditions:  blood disorders  hearing problems  kidney disease  recent or ongoing radiation therapy  an unusual or allergic reaction to carboplatin, cisplatin, other chemotherapy, other medicines, foods, dyes, or preservatives  pregnant or trying to get pregnant  breast-feeding How should I use this medicine? This drug is usually given as an infusion into a vein. It is administered in a hospital or clinic by a specially trained health care professional. Talk to your pediatrician regarding the use of this medicine in children. Special care may be needed. Overdosage: If you think you have taken too much of this medicine contact a poison control center or emergency room at once. NOTE:  This medicine is only for you. Do not share this medicine with others. What if I miss a dose? It is important not to miss a dose. Call your doctor or health care professional if you are unable to keep an appointment. What may interact with this medicine?  medicines for seizures  medicines to increase blood counts like filgrastim, pegfilgrastim, sargramostim  some antibiotics like amikacin, gentamicin, neomycin, streptomycin, tobramycin  vaccines Talk to your doctor or health care professional before taking any of these   acetaminophen  aspirin  ibuprofen  ketoprofen  naproxen This list may not describe all possible interactions. Give your health care provider a list of all the medicines, herbs, non-prescription drugs, or dietary supplements you use. Also tell them if you smoke, drink alcohol, or use illegal drugs. Some items may interact with your medicine. What should I watch for while using this medicine? Your condition will be monitored carefully while you are receiving this medicine. You will need important blood work done while you are taking this medicine. This drug may make you feel generally unwell. This is not uncommon, as chemotherapy can affect healthy cells as well as cancer cells. Report any side effects. Continue your course of treatment even though you feel ill unless your doctor tells you to stop. In some cases, you may be given additional medicines to help with side effects. Follow all directions for their use. Call your doctor or health care professional for advice if you get a fever, chills or sore throat, or other symptoms of a cold or flu. Do not treat yourself. This drug decreases your body's ability to fight infections. Try to avoid being around people who are sick. This medicine may increase your risk to bruise or bleed. Call your doctor or health care professional if you notice any unusual bleeding. Be careful brushing and flossing your teeth or using  a toothpick because you may get an infection or bleed more easily. If you have any dental work done, tell your dentist you are receiving this medicine. Avoid taking products that contain aspirin, acetaminophen, ibuprofen, naproxen, or ketoprofen unless instructed by your doctor. These medicines may hide a fever. Do not become pregnant while taking this medicine. Women should inform their doctor if they wish to become pregnant or think they might be pregnant. There is a potential for serious side effects to an unborn child. Talk to your health care professional or pharmacist for more information. Do not breast-feed an infant while taking this medicine. What side effects may I notice from receiving this medicine? Side effects that you should report to your doctor or health care professional as soon as possible:  allergic reactions like skin rash, itching or hives, swelling of the face, lips, or tongue  signs of infection - fever or chills, cough, sore throat, pain or difficulty passing urine  signs of decreased platelets or bleeding - bruising, pinpoint red spots on the skin, black, tarry stools, nosebleeds  signs of decreased red blood cells - unusually weak or tired, fainting spells, lightheadedness  breathing problems  changes in hearing  changes in vision  chest pain  high blood pressure  low blood counts - This drug may decrease the number of white blood cells, red blood cells and platelets. You may be at increased risk for infections and bleeding.  nausea and vomiting  pain, swelling, redness or irritation at the injection site  pain, tingling, numbness in the hands or feet  problems with balance, talking, walking  trouble passing urine or change in the amount of urine Side effects that usually do not require medical attention (report to your doctor or health care professional if they continue or are bothersome):  hair loss  loss of appetite  metallic taste in the mouth  or changes in taste This list may not describe all possible side effects. Call your doctor for medical advice about side effects. You may report side effects to FDA at 1-800-FDA-1088. Where should I keep my medicine? This drug is given in  given in a hospital or clinic and will not be stored at home. NOTE: This sheet is a summary. It may not cover all possible information. If you have questions about this medicine, talk to your doctor, pharmacist, or health care provider.  2020 Elsevier/Gold Standard (2007-11-27 14:38:05)  

## 2020-06-01 ENCOUNTER — Inpatient Hospital Stay: Payer: No Typology Code available for payment source

## 2020-06-01 ENCOUNTER — Encounter: Payer: Self-pay | Admitting: Genetic Counselor

## 2020-06-01 ENCOUNTER — Other Ambulatory Visit: Payer: Self-pay

## 2020-06-01 ENCOUNTER — Inpatient Hospital Stay (HOSPITAL_BASED_OUTPATIENT_CLINIC_OR_DEPARTMENT_OTHER): Payer: No Typology Code available for payment source | Admitting: Genetic Counselor

## 2020-06-01 ENCOUNTER — Other Ambulatory Visit: Payer: Self-pay | Admitting: Genetic Counselor

## 2020-06-01 DIAGNOSIS — C50212 Malignant neoplasm of upper-inner quadrant of left female breast: Secondary | ICD-10-CM

## 2020-06-01 DIAGNOSIS — Z809 Family history of malignant neoplasm, unspecified: Secondary | ICD-10-CM

## 2020-06-01 DIAGNOSIS — Z170 Estrogen receptor positive status [ER+]: Secondary | ICD-10-CM

## 2020-06-01 DIAGNOSIS — Z8371 Family history of colonic polyps: Secondary | ICD-10-CM

## 2020-06-01 NOTE — Progress Notes (Signed)
REFERRING PROVIDER: Nicholas Lose, MD [place],  [place] [address]  PRIMARY PROVIDER:  Rutherford Guys, MD  PRIMARY REASON FOR VISIT:  1. Malignant neoplasm of upper-inner quadrant of left breast in female, estrogen receptor positive ([place])   2. Family history of cancer    HISTORY OF PRESENT ILLNESS:   Sandra Miller, a 44 y.o. female, was seen for a [place] cancer genetics consultation at the request of Dr. Lindi Adie due to a personal history of breast cancer and family history of cancer.  Sandra Miller presents to clinic today to discuss the possibility of a hereditary predisposition to cancer, genetic testing, and to further clarify her future cancer risks, as well as potential cancer risks for family members.    In May of 2021, at the age of 3, Sandra Miller was diagnosed with vulvar cancer. The treatment plan included chemotherapy.  In June of 2021, at the age of 65, Sandra Miller was diagnosed with invasive ductal carcinoma and ductal carcinoma in situ of the left breast.  She is currently undergoing chemotherapy with preliminary plans for breast conserving surgery and adjuvant radiation.    CANCER HISTORY:  Oncology History  Vulvar cancer ([place])  01/29/2020 Initial Diagnosis   Vulvar cancer ([place])   03/11/2020 - 05/04/2020 Chemotherapy   The patient had dexamethasone (DECADRON) 4 MG tablet, 4 mg (100 % of original dose 4 mg), Oral, Daily, 1 of 1 cycle, Start date: 02/27/2020, End date: 05/05/2020 Dose modification: 4 mg (original dose 4 mg, Cycle 0) palonosetron (ALOXI) injection 0.25 mg, 0.25 mg, Intravenous,  Once, 1 of 1 cycle Administration: 0.25 mg (03/11/2020), 0.25 mg (03/17/2020), 0.25 mg (03/24/2020), 0.25 mg (03/31/2020), 0.25 mg (04/07/2020), 0.25 mg (04/14/2020), 0.25 mg (04/21/2020) CARBOplatin (PARAPLATIN) 300 mg in sodium chloride 0.9 % 250 mL chemo infusion, 300 mg (100 % of original dose 300 mg), Intravenous,  Once, 1 of 1 cycle Dose modification:   (original dose 300 mg,  Cycle 1) Administration: 300 mg (03/11/2020), 290 mg (03/17/2020), 300 mg (03/24/2020), 230 mg (03/31/2020), 230 mg (04/07/2020), 230 mg (04/14/2020) PACLitaxel (TAXOL) 108 mg in sodium chloride 0.9 % 250 mL chemo infusion (</= 35m/m2), 50 mg/m2 = 108 mg, Intravenous,  Once, 1 of 1 cycle Dose modification: 45 mg/m2 (original dose 50 mg/m2, Cycle 1, Reason: Dose not tolerated) Administration: 108 mg (03/11/2020), 108 mg (03/17/2020), 108 mg (03/24/2020), 96 mg (03/31/2020), 96 mg (04/07/2020), 96 mg (04/14/2020) fosaprepitant (EMEND) 150 mg in sodium chloride 0.9 % 145 mL IVPB, 150 mg, Intravenous,  Once, 1 of 1 cycle Administration: 150 mg (03/24/2020), 150 mg (03/31/2020), 150 mg (04/07/2020), 150 mg (04/14/2020) trastuzumab-anns (KANJINTI) 378 mg in sodium chloride 0.9 % 250 mL chemo infusion, 4 mg/kg = 378 mg (100 % of original dose 4 mg/kg), Intravenous,  Once, 1 of 1 cycle Dose modification: 4 mg/kg (original dose 4 mg/kg, Cycle 1, Reason: Other (see comments), Comment: loading dose) Administration: 378 mg (03/11/2020), 189 mg (03/17/2020), 189 mg (03/24/2020), 189 mg (03/31/2020), 189 mg (04/07/2020), 189 mg (04/14/2020), 189 mg (04/21/2020), 189 mg (05/04/2020)  for chemotherapy treatment.    Malignant neoplasm of upper-inner quadrant of left breast in female, estrogen receptor positive ([facility]  02/20/2020 Initial Diagnosis   PET scan on 02/07/20 following a diagnosis of vulvar cancer showed a 1.7cm left breast mass and mildly hypermetabolic left axillary lymph nodes. Mammogram and [facility] 02/18/20 showed a 3.1cm left breast mass at the 11 o'clock position, palpable on exam, and a single abnormal left  axillary lymph node with cortical thickening. Left breast biopsy on 02/20/20 showed invasive and in situ carcinoma in the breast and axilla, grade 3, HER-2 positive (3+), ER/PR+ 95%, Ki67 75%   02/25/2020 Cancer Staging   Staging form: Breast, AJCC 8th Edition - Clinical stage from 02/25/2020: Stage IB (cT2, cN1, cM0, G3, ER+, PR+,  HER2+) - Signed by Nicholas Lose, MD on 02/25/2020   05/29/2020 -  Chemotherapy   The patient had palonosetron (ALOXI) injection 0.25 mg, 0.25 mg, Intravenous,  Once, 1 of 3 cycles Administration: 0.25 mg (05/29/2020) pegfilgrastim-jmdb (FULPHILA) injection 6 mg, 6 mg, Subcutaneous,  Once, 1 of 3 cycles CARBOplatin (PARAPLATIN) 580 mg in sodium chloride 0.9 % 250 mL chemo infusion, 580 mg (100 % of original dose 580 mg), Intravenous,  Once, 1 of 3 cycles Dose modification:   (original dose 580 mg, Cycle 1) Administration: 580 mg (05/29/2020) DOCEtaxel (TAXOTERE) 120 mg in sodium chloride 0.9 % 250 mL chemo infusion, 60 mg/m2 = 120 mg (100 % of original dose 60 mg/m2), Intravenous,  Once, 1 of 3 cycles Dose modification: 60 mg/m2 (original dose 60 mg/m2, Cycle 1, Reason: Provider Judgment) Administration: 120 mg (05/29/2020) fosaprepitant (EMEND) 150 mg in sodium chloride 0.9 % 145 mL IVPB, 150 mg, Intravenous,  Once, 1 of 3 cycles Administration: 150 mg (05/29/2020) pertuzumab (PERJETA) 420 mg in sodium chloride 0.9 % 250 mL chemo infusion, 420 mg (50 % of original dose 840 mg), Intravenous, Once, 1 of 3 cycles Dose modification: 420 mg (original dose 840 mg, Cycle 1, Reason: Provider Judgment) Administration: 420 mg (05/29/2020) trastuzumab-dkst (OGIVRI) 525 mg in sodium chloride 0.9 % 250 mL chemo infusion, 6 mg/kg = 525 mg (75 % of original dose 8 mg/kg), Intravenous,  Once, 1 of 3 cycles Dose modification: 6 mg/kg (original dose 8 mg/kg, Cycle 1, Reason: Provider Judgment) Administration: 525 mg (05/29/2020)  for chemotherapy treatment.      RISK FACTORS:  Menarche was at age 19.  First live birth at age 17.  OCP use for approximately less than 5 years.  Ovaries intact: yes.  Hysterectomy: no.  HRT use: 0 years. Colonoscopy: most recent in 08/2013; normal Mammogram within the last year: yes. Number of breast biopsies: 2 per patient Up to date with pelvic exams: yes. Any excessive  radiation exposure in the past: no  Past Medical History:  Diagnosis Date  . Bipolar 1 disorder ([place])   . Cancer ([place])   . Complication of anesthesia    wakes up during procedures  . GAD (generalized anxiety disorder)   . Genital HSV    currently per pt  no break out 03-22-2016   . GERD (gastroesophageal reflux disease)   . Hiatal hernia   . History of cervical dysplasia    2012 laser ablation  . History of esophageal dilatation    for dysphasia -- x2 dilated  . History of gastric ulcer   . History of Helicobacter pylori infection    remote hx  . History of hidradenitis suppurativa    "gets all over body intermittantly"    . History of hypertension    no issue since stopped drinking alcohol 2014  . History of kidney stones   . History of panic attacks   . Iron deficiency anemia   . Left ureteral stone   . OCD (obsessive compulsive disorder)   . PTSD (post-traumatic stress disorder)   . Recovering alcoholic in remission ([place])    since 2014  . RLS (restless legs  syndrome)   . Smokers' cough ([place])   . Urgency of urination   . Yeast infection involving the vagina and surrounding area    secondary to taking antibiotic    Past Surgical History:  Procedure Laterality Date  . CESAREAN SECTION  1995   w/  Bilateral Tubal Ligation  . COLONOSCOPY  last one 08-09-2013  . CYSTOSCOPY W/ URETERAL STENT PLACEMENT Left 03/29/2016   Procedure: CYSTOSCOPY WITH STENT REPLACEMENT;  Surgeon: Nickie Retort, MD;  Location[facility];  Service: Urology;  Laterality: Left;  . CYSTOSCOPY WITH RETROGRADE PYELOGRAM, URETEROSCOPY AND STENT PLACEMENT Left 03/08/2016   Procedure: CYSTOSCOPY WITH  LEFT RETROGRADE PYELOGRAM, AND STENT PLACEMENT;  Surgeon: Nickie Retort, MD;  Location: [facility];  Service: Urology;  Laterality: Left;  . CYSTOSCOPY/RETROGRADE/URETEROSCOPY/STONE EXTRACTION WITH BASKET Left 03/29/2016   Procedure: CYSTOSCOPY/RETROGRADE/URETEROSCOPY/STONE EXTRACTION  WITH BASKET;  Surgeon: Nickie Retort, MD;  Location: [facility];  Service: Urology;  Laterality: Left;  . ENDOMETRIAL ABLATION W/ NOVASURE  04-01-2010  . ESOPHAGOGASTRODUODENOSCOPY  last one 08-09-2013  . KNEE ARTHROSCOPY Left as teen  . LASER ABLATION OF THE CERVIX  2012 approx  . PORTACATH PLACEMENT Right 03/06/2020   Procedure: INSERTION PORT-A-CATH WITH ULTRASOUND GUIDANCE;  Surgeon: Alphonsa Overall, MD;  Location: [facility];  Service: General;  Laterality: Right;  . TRANSTHORACIC ECHOCARDIOGRAM  05-19-2006   lvsf normal, ef 55-65%, there was mild flattening of the interventricular septum during diastoli/  RV size at upper limits normal  . TUBAL LIGATION    . WISDOM TOOTH EXTRACTION  age 18 's    Social History   Socioeconomic History  . Marital status: Single    Spouse name: Not on file  . Number of children: Not on file  . Years of education: Not on file  . Highest education level: Not on file  Occupational History  . Not on file  Tobacco Use  . Smoking status: Current Every Day Smoker    Packs/day: 0.50    Years: 22.00    Pack years: 11.00    Types: Cigarettes  . Smokeless tobacco: Never Used  . Tobacco comment: Recently started a smoking cessation class.   Vaping Use  . Vaping Use: Never used  Substance and Sexual Activity  . Alcohol use: Not Currently    Alcohol/week: 0.0 standard drinks    Comment:  hx alcohollism  in remission since 2014  . Drug use: No    Comment: hx  marijuana use  . Sexual activity: Yes    Partners: Male    Birth control/protection: Surgical  Other Topics Concern  . Not on file  Social History Narrative   Former [facility] patient.      Was on disability at one point.   Return to the workforce.  40 hours a week at [employer], 10 hours a week on the weekends at [employer] in [place] at night 10 to 12 hours a week.      Has grown children, she lives alone with a pet, continues to  smoke no alcohol or drug use at this time      History of EtOH abuse and THC use.         Social Determinants of Health   Financial Resource Strain:   . Difficulty of Paying Living Expenses: Not on file  Food Insecurity: No Food Insecurity  . Worried About Charity fundraiser in the Last Year: Never true  .  Ran Out of Food in the Last Year: Never true  Transportation Needs: No Transportation Needs  . Lack of Transportation (Medical): No  . Lack of Transportation (Non-Medical): No  Physical Activity:   . Days of Exercise per Week: Not on file  . Minutes of Exercise per Session: Not on file  Stress:   . Feeling of Stress : Not on file  Social Connections:   . Frequency of Communication with Friends and Family: Not on file  . Frequency of Social Gatherings with Friends and Family: Not on file  . Attends Religious Services: Not on file  . Active Member of Clubs or Organizations: Not on file  . Attends Archivist Meetings: Not on file  . Marital Status: Not on file     FAMILY HISTORY:  We obtained a detailed, 4-generation family history.  Significant diagnoses are listed below: Family History  Problem Relation Age of Onset  . Lung cancer Father        d. 67  . Colon polyps Brother   . Cancer Paternal Grandfather        "stomach"  . Cancer Cousin        maternal; ovarian cancer or other "female" cancer?  . Lung cancer Paternal Uncle 27  . Throat cancer Cousin        paternal; dx 60s  . Lung cancer Cousin        paternal; dx 81s     Ms. Corona has one son, age 2, and one daughter, age 37, both without a cancer history.  Ms. West has two full brothers in their 18s.  One brother has had several precancerous polyps.  Ms. Randleman could not recall the number of polyps he has had.  Ms. Knowlton also has one maternal sister, age 54, without a cancer history.  Ms. Brian mother is 61 and does not have cancer.  Ms. Stakes maternal cousin was diagnosed with ovarian cancer or  another "female cancer" in her 56s. No other maternal history of cancer was reported.  Ms. Hitch father passed away at 76 and had a diagnosis of lung cancer.  Ms. Pillars also had a maternal uncle diagnosed with lung cancer who passed away at 58.  Ms. Spillers had a paternal female cousin with lung cancer who passed away in their 42s and a paternal female cousin who was diagnosed with throat and mouth cancer in her 68s.  Ms. Schuff paternal grandfather had an unknown type of cancer/"stomach" cancer diagnosed after the age of 22.  No other family history of cancer was reported.   Ms. Fonseca is unaware of previous family history of genetic testing for hereditary cancer risks. Patient's maternal ancestors are of White/Caucasian descent, and paternal ancestors are of [place] American descent. There is no reported Ashkenazi Jewish ancestry. There is no known consanguinity.  GENETIC COUNSELING ASSESSMENT: Ms. Speers is a 44 y.o. female with a personal history of breast cancer and family history of cancer which is somewhat suggestive of a hereditary cancer syndrome and predisposition to cancer given her age of diagnosis. We, therefore, discussed and recommended the following at today's visit.   DISCUSSION: We discussed that 5 - 10% of cancer is hereditary, with most cases of hereditary breast cancer associated with mutations in BRCA1 and BRCA2.  There are other genes that can be associated with hereditary breast cancer syndromes.  The type of cancer risks and level of risk are gene-specific.  We discussed that testing is beneficial for several reasons  including knowing how to follow individuals after completing their treatment, identifying whether potential treatment options would be beneficial, and understand if other family members could be at risk for cancer and allow them to undergo genetic testing.   We reviewed the characteristics, features and inheritance patterns of hereditary cancer syndromes. We also  discussed genetic testing, including the appropriate family members to test, the process of testing, insurance coverage and turn-around-time for results. We discussed the implications of a negative, positive, carrier and/or variant of uncertain significant result. We recommended Ms. Boulter pursue genetic testing for a panel that includes genes associated with breast cancer and colon polyps.   Ms. Albano  was offered a common hereditary cancer panel (48 genes) and an expanded pan-cancer panel (85 genes). Ms. Virgo was informed of the benefits and limitations of each panel, including that expanded pan-cancer panels contain several preliminary evidence genes that do not have clear management guidelines at this point in time.  We also discussed that as the number of genes included on a panel increases, the chances of variants of uncertain significance increases.  After considering the benefits and limitations of each gene panel, Ms. Tourville  elected to have common hereditary cancers panel through [facility].  The Common Hereditary Cancers Panel offered by [facility] includes sequencing and/or deletion duplication testing of the following 48 genes: APC, ATM, AXIN2, BARD1, BMPR1A, BRCA1, BRCA2, BRIP1, CDH1, CDK4, CDKN2A (p14ARF), CDKN2A (p16INK4a), CHEK2, CTNNA1, DICER1, EPCAM (Deletion/duplication testing only), GREM1 (promoter region deletion/duplication testing only), KIT, MEN1, MLH1, MSH2, MSH3, MSH6, MUTYH, NBN, NF1, NHTL1, PALB2, PDGFRA, PMS2, POLD1, POLE, PTEN, RAD50, RAD51C, RAD51D, RNF43, SDHB, SDHC, SDHD, SMAD4, SMARCA4. STK11, TP53, TSC1, TSC2, and VHL.  The following genes were evaluated for sequence changes only: SDHA and HOXB13 c.251G>A variant only.  Based on Ms. Madrazo's personal and family history of cancer, she meets NCCN medical criteria for genetic testing. Despite that she meets criteria, she may still have an out of pocket cost.  We reviewed options for cost of testing, including the Patient Assistance  Program (PAP), through [employer].  She wishes to have this testing and will look for paperwork for proof of income in order to submit for a testing fee waiver.   PLAN: After considering the risks, benefits, and limitations, Ms. Quinonez is interested in pursuing genetic testing but wishes to hold off on sending in a sample until she is able to locate paperwork to apply for the PAP.  I will follow up with her by phone call in one week to determine if she wishes to proceed with genetic testing at that time. Once a sample is submitted, results should be available within approximately 3 weeks' time, at which point they will be disclosed by telephone to Ms. Sochacki, as will any additional recommendations warranted by these results. Ms. Krock will receive a summary of her genetic counseling visit and a copy of her results once available. This information will also be available in Epic.   Lastly, we encouraged Ms. Lopezgarcia to remain in contact with cancer genetics annually so that we can continuously update the family history and inform her of any changes in cancer genetics and testing that may be of benefit for this family.   Ms. Anglemyer questions were answered to her satisfaction today. Our contact information was provided should additional questions or concerns arise. Thank you for the referral and allowing [place] to share in the care of your patient.   Juelz Whittenberg M. Joette Catching, [place], [place] Certified Film/video [email or web address] (  P) [phone]  The patient was seen for a total of 35 minutes in face-to-face genetic counseling.  This patient was discussed with Drs. Magrinat, Lindi Adie and/or Burr Medico who agrees with the above.  _______________________________________________________________________ For Office Staff:  Number of people involved in session: 1 Was an Intern/ student involved with case: no

## 2020-06-03 NOTE — Progress Notes (Incomplete)
  Patient Name: Sandra Miller MRN: [id number] DOB: Dec 14, 1975 Referring Physician: Everitt Amber (Profile Not Attached) Date of Service: 05/08/2020  [facility] [place]                                                        End Of Treatment Note  Diagnoses: C51.9-Malignant neoplasm of vulva, unspecified  Cancer Staging: Clinical stage IB squamous cell carcinoma of theposterior vulva/perineal body  Intent: Curative  Radiation Treatment Dates: 03/16/2020 through 05/08/2020 Site Technique Total Dose (Gy) Dose per Fx (Gy) Completed Fx Beam Energies  Vulva: Pelvis IMRT 45/45 1.8 25/25 6X  Vulva: Pelvis_Bst IMRT 16.2/16.2 1.8 9/9 6X   Narrative: The patient tolerated radiation therapy relatively well. She did report moderate fatigue, nausea related to chemotherapy, vulvar skin irritation with associated dysuria, and diarrhea controlled with Lomotil. She also reported some brown vaginal discharge towards the end of treatment. She denied vomiting, rectal bleeding, and vaginal bleeding. Midway through treatment, the patient developed some mosit desquamation in the surrounding vulvar region. She was also noted to have white creamy discharge that was consistent with a yeast infection and was prescribed Diflucan. Moist desquamation improved with the use of Silvadene and Neosporin Plus. The vulvar lesion continued to shrink throughout treatment.  Of note, the patient underwent a soft tissue neck CT scan on 04/11/2020 for right-sided neck pain and sore throat. Results showed an occlusive thrombus within the mid to inferior right internal jugular vein with thrombus likely extending to surround portions of the right IJ approach infusion port catheter. There were surrounding inflammatory changes within the right neck but no appreciable swelling or  discrete mass within the oral cavity, pharynx, or larynx. Additionally, there was no retropharyngeal fluid collection demonstrated. She was treated with Xarelto.  Plan: The patient will follow-up with radiation oncology in one month.  ________________________________________________   Blair Promise, PhD, MD  This document serves as a record of services personally performed by Gery Pray, MD. It was created on his behalf by Clerance Lav, a trained medical scribe. The creation of this record is based on the scribe's personal observations and the provider's statements to them. This document has been checked and approved by the attending provider.

## 2020-06-04 ENCOUNTER — Inpatient Hospital Stay: Payer: No Typology Code available for payment source

## 2020-06-04 ENCOUNTER — Telehealth: Payer: Self-pay

## 2020-06-04 ENCOUNTER — Telehealth: Payer: Self-pay | Admitting: *Deleted

## 2020-06-04 ENCOUNTER — Inpatient Hospital Stay: Payer: No Typology Code available for payment source | Admitting: Hematology and Oncology

## 2020-06-04 ENCOUNTER — Inpatient Hospital Stay (HOSPITAL_BASED_OUTPATIENT_CLINIC_OR_DEPARTMENT_OTHER): Payer: No Typology Code available for payment source | Admitting: Medical

## 2020-06-04 ENCOUNTER — Encounter: Payer: Self-pay | Admitting: *Deleted

## 2020-06-04 ENCOUNTER — Other Ambulatory Visit: Payer: Self-pay

## 2020-06-04 VITALS — BP 111/89 | HR 108 | Temp 98.6°F | Resp 20

## 2020-06-04 DIAGNOSIS — C50212 Malignant neoplasm of upper-inner quadrant of left female breast: Secondary | ICD-10-CM

## 2020-06-04 DIAGNOSIS — K219 Gastro-esophageal reflux disease without esophagitis: Secondary | ICD-10-CM

## 2020-06-04 DIAGNOSIS — E876 Hypokalemia: Secondary | ICD-10-CM | POA: Diagnosis not present

## 2020-06-04 DIAGNOSIS — Z5111 Encounter for antineoplastic chemotherapy: Secondary | ICD-10-CM | POA: Diagnosis not present

## 2020-06-04 DIAGNOSIS — Z95828 Presence of other vascular implants and grafts: Secondary | ICD-10-CM

## 2020-06-04 DIAGNOSIS — Z170 Estrogen receptor positive status [ER+]: Secondary | ICD-10-CM

## 2020-06-04 DIAGNOSIS — R112 Nausea with vomiting, unspecified: Secondary | ICD-10-CM | POA: Diagnosis not present

## 2020-06-04 DIAGNOSIS — Z809 Family history of malignant neoplasm, unspecified: Secondary | ICD-10-CM

## 2020-06-04 DIAGNOSIS — Z8371 Family history of colonic polyps: Secondary | ICD-10-CM

## 2020-06-04 LAB — CMP (CANCER CENTER ONLY)
ALT: 46 U/L — ABNORMAL HIGH (ref 0–44)
AST: 37 U/L (ref 15–41)
Albumin: 4.2 g/dL (ref 3.5–5.0)
Alkaline Phosphatase: 59 U/L (ref 38–126)
Anion gap: 13 (ref 5–15)
BUN: 10 mg/dL (ref 6–20)
CO2: 22 mmol/L (ref 22–32)
Calcium: 9.7 mg/dL (ref 8.9–10.3)
Chloride: 99 mmol/L (ref 98–111)
Creatinine: 0.76 mg/dL (ref 0.44–1.00)
GFR, Est AFR Am: >60 mL/min
GFR, Estimated: >60 mL/min
Glucose, Bld: 124 mg/dL — ABNORMAL HIGH (ref 70–99)
Potassium: 3.1 mmol/L — ABNORMAL LOW (ref 3.5–5.1)
Sodium: 134 mmol/L — ABNORMAL LOW (ref 135–145)
Total Bilirubin: 0.9 mg/dL (ref 0.3–1.2)
Total Protein: 8.4 g/dL — ABNORMAL HIGH (ref 6.5–8.1)

## 2020-06-04 LAB — CBC WITH DIFFERENTIAL (CANCER CENTER ONLY)
Abs Immature Granulocytes: 0.1 K/uL — ABNORMAL HIGH (ref 0.00–0.07)
Basophils Absolute: 0.1 K/uL (ref 0.0–0.1)
Basophils Relative: 2 %
Eosinophils Absolute: 0 K/uL (ref 0.0–0.5)
Eosinophils Relative: 0 %
HCT: 33.5 % — ABNORMAL LOW (ref 36.0–46.0)
Hemoglobin: 11.3 g/dL — ABNORMAL LOW (ref 12.0–15.0)
Immature Granulocytes: 2 %
Lymphocytes Relative: 19 %
Lymphs Abs: 1.2 K/uL (ref 0.7–4.0)
MCH: 31.9 pg (ref 26.0–34.0)
MCHC: 33.7 g/dL (ref 30.0–36.0)
MCV: 94.6 fL (ref 80.0–100.0)
Monocytes Absolute: 0.1 K/uL (ref 0.1–1.0)
Monocytes Relative: 2 %
Neutro Abs: 4.7 K/uL (ref 1.7–7.7)
Neutrophils Relative %: 75 %
Platelet Count: 261 K/uL (ref 150–400)
RBC: 3.54 MIL/uL — ABNORMAL LOW (ref 3.87–5.11)
RDW: 15.7 % — ABNORMAL HIGH (ref 11.5–15.5)
WBC Count: 6.1 K/uL (ref 4.0–10.5)
nRBC: 0 % (ref 0.0–0.2)

## 2020-06-04 MED ORDER — HEPARIN SOD (PORK) LOCK FLUSH 100 UNIT/ML IV SOLN
500.0000 [IU] | Freq: Once | INTRAVENOUS | Status: AC
  Administered 2020-06-04: 500 [IU]
  Filled 2020-06-04: qty 5

## 2020-06-04 MED ORDER — SODIUM CHLORIDE 0.9 % IV SOLN
INTRAVENOUS | Status: AC
  Filled 2020-06-04 (×2): qty 250

## 2020-06-04 MED ORDER — PREDNISONE 5 MG PO TABS: 5.0000 mg | ORAL_TABLET | Freq: Two times a day (BID) | ORAL | 1 refills | Status: DC

## 2020-06-04 MED ORDER — ALUM & MAG HYDROXIDE-SIMETH 200-200-20 MG/5ML PO SUSP
ORAL | Status: AC
  Filled 2020-06-04: qty 30

## 2020-06-04 MED ORDER — ALUM & MAG HYDROXIDE-SIMETH 200-200-20 MG/5ML PO SUSP
30.0000 mL | Freq: Once | ORAL | Status: AC
  Administered 2020-06-04: 30 mL via ORAL

## 2020-06-04 MED ORDER — SODIUM CHLORIDE 0.9 % IV SOLN
10.0000 mg | Freq: Once | INTRAVENOUS | Status: AC
  Administered 2020-06-04: 10 mg via INTRAVENOUS
  Filled 2020-06-04: qty 10

## 2020-06-04 MED ORDER — ONDANSETRON HCL 4 MG/2ML IJ SOLN
INTRAMUSCULAR | Status: AC
  Filled 2020-06-04: qty 4

## 2020-06-04 MED ORDER — PROCHLORPERAZINE MALEATE 10 MG PO TABS
10.0000 mg | ORAL_TABLET | Freq: Four times a day (QID) | ORAL | Status: DC | PRN
  Administered 2020-06-04: 10 mg via ORAL

## 2020-06-04 MED ORDER — DRONABINOL 2.5 MG PO CAPS: 2.5000 mg | ORAL_CAPSULE | Freq: Two times a day (BID) | ORAL | 0 refills | Status: DC

## 2020-06-04 MED ORDER — SODIUM CHLORIDE 0.9% FLUSH
10.0000 mL | Freq: Once | INTRAVENOUS | Status: AC
  Administered 2020-06-04: 10 mL
  Filled 2020-06-04: qty 10

## 2020-06-04 MED ORDER — ALUM & MAG HYDROXIDE-SIMETH 200-200-20 MG/5ML PO SUSP: 15.0000 mL | ORAL | Status: DC | PRN

## 2020-06-04 MED ORDER — ONDANSETRON HCL 4 MG/2ML IJ SOLN
8.0000 mg | Freq: Once | INTRAMUSCULAR | Status: AC
  Administered 2020-06-04: 8 mg via INTRAVENOUS

## 2020-06-04 MED ORDER — PROCHLORPERAZINE MALEATE 10 MG PO TABS
ORAL_TABLET | ORAL | Status: AC
  Filled 2020-06-04: qty 1

## 2020-06-04 MED FILL — DRONABINOL 2.5 MG CAPSULE: 2.5 | 30 days supply | Qty: 60 | Fill #0

## 2020-06-04 MED FILL — LORazepam 1 MG TABS: 1 | 30 days supply | Qty: 90 | Fill #2

## 2020-06-04 MED FILL — PROMETHAZINE 25 MG TABLET: 25 | 7 days supply | Qty: 30 | Fill #3

## 2020-06-04 MED FILL — predniSONE 5 MG TABS: 5 | 30 days supply | Qty: 60 | Fill #0

## 2020-06-04 NOTE — Progress Notes (Signed)
Sympt[facility] Management Clinic Progress Note   Sandra Miller [id number] June 26, 1976 44 y.o.  Sandra Miller is managed by Dr. Nicholas Lose  Actively treated with chemotherapy/immunotherapy/hormonal therapy: yes  Current therapy: Paul treated: 05/29/2020 (cycle #1, day #1)  Next scheduled appointment with provider: 06/19/2020  Assessment: Plan:    Intractable vomiting with nausea, unspecified vomiting type - Plan: ondansetron (ZOFRAN) injection 8 mg, dexamethasone (DECADRON) 10 mg in sodium chloride 0.9 % 50 mL IVPB, 0.9 %  sodium chloride infusion, prochlorperazine (COMPAZINE) tablet 10 mg, dronabinol (MARINOL) 2.5 MG capsule, predniSONE (DELTASONE) 5 MG tablet, DISCONTINUED: alum & mag hydroxide-simeth (MAALOX/MYLANTA) 200-200-20 MG/5ML suspension 15 mL  Gastroesophageal reflux disease without esophagitis - Plan: alum & mag hydroxide-simeth (MAALOX/MYLANTA) 200-200-20 MG/5ML suspension 30 mL  Malignant neoplasm of upper-inner quadrant of left breast in female, estrogen receptor positive (HCC)   Refractory nausea and vomiting: The patient was given Zofran 8 mg IV and dexamethasone 10 mg IV.  Despite this she continued to have nausea and was given Compazine 10 mg p.o. x1.  Additionally she was given a prescription for Marinol 2.5 mg p.o. twice daily which she will use in addition to Compazine.  She was given a prescription for prednisone 5 mg p.o. twice daily with instructions to use this daily until her nausea resolves.  She was given 1 L of normal saline IV today.  GERD: The patient was given Mylanta 30 mL p.o. x1.  ER positive malignant neoplasm of the left breast: Sandra Miller continues to be followed by Dr. Nicholas Lose and is status post cycle 1 of [place] P which was dosed on 05/29/2020.  She is scheduled to be seen next in follow-up on 06/19/2020.  Hypokalemia: The patient's chemistry panel returned showing a potassium of 3.1.  She has been unable to take her potassium for  the last several days due to nausea and vomiting.  She was told to restart her potassium when her nausea abates and take it 3 times daily for 2 to 3 days then go back to her twice daily dosing.  Repeat chemistries will be collected on her return.  Please see After Visit Summary for patient specific instructions.  Future Appointments  Date Time Provider [place]  06/08/2020  3:15 PM Gery Pray, MD [facility] None  06/19/2020 10:00 AM [facility] LAB [facility] None  06/19/2020 10:45 AM Nicholas Lose, MD [facility] None  06/19/2020 11:45 AM [facility] INFUSION [facility] None  06/25/2020  9:20 AM Arfeen, Arlyce Harman, MD [facility] None  07/10/2020 10:00 AM [facility] LAB [facility] None  07/10/2020 10:15 AM [facility] [place] FLUSH [facility] None  07/10/2020 10:45 AM Nicholas Lose, MD [facility] None  07/10/2020 11:45 AM [facility] INFUSION [facility] None    No orders of the defined types were placed in this encounter.      Subjective:   Patient ID:  Sandra Miller is a 44 y.o. (DOB 1975-12-24) female.  Chief Complaint: No chief complaint on file.   HPI Sandra Miller is a 44 y.o. female with a diagnosis of an ER positive malignant neoplasm of the left breast. She continues to be followed by Dr. Nicholas Lose and is status post cycle 1 of [place] P which was dosed on 05/29/2020.  She has had refractory nausea and vomiting since her last chemotherapy.  She has Compazine, Phenergan, Ativan, and Zofran at home.  Despite her use of this she continues to have nausea and vomiting.  She has a prescription for potassium chloride  at home which he is to be taking twice daily.  She reports that she has been unable to take her potassium chloride for the past several days due to nausea and vomiting.  She denies fevers, chills, or sweats.  Medications: I have reviewed the patient's current medications.  Allergies:  Allergies  Allergen Reactions   Depakote [Divalproex Sodium] Nausea And  Vomiting   Minocycline Hives   Aspirin Hives    States able to tolerate Goody Powders without any problem except for GI upset    Past Medical History:  Diagnosis Date   Bipolar 1 disorder ([place])    Cancer ([place])    Complication of anesthesia    wakes up during procedures   GAD (generalized anxiety disorder)    Genital HSV    currently per pt  no break out 03-22-2016    GERD (gastroesophageal reflux disease)    Hiatal hernia    History of cervical dysplasia    2012 laser ablation   History of esophageal dilatation    for dysphasia -- x2 dilated   History of gastric ulcer    History of Helicobacter pylori infection    remote hx   History of hidradenitis suppurativa    "gets all over body intermittantly"     History of hypertension    no issue since stopped drinking alcohol 2014   History of kidney stones    History of panic attacks    Iron deficiency anemia    Left ureteral stone    OCD (obsessive compulsive disorder)    PTSD (post-traumatic stress disorder)    Recovering alcoholic in remission ([place])    since 2014   RLS (restless legs syndrome)    Smokers' cough ([place])    Urgency of urination    Yeast infection involving the vagina and surrounding area    secondary to taking antibiotic    Past Surgical History:  Procedure Laterality Date   CESAREAN SECTION  1995   w/  Bilateral Tubal Ligation   COLONOSCOPY  last one 08-09-2013   CYSTOSCOPY W/ URETERAL STENT PLACEMENT Left 03/29/2016   Procedure: CYSTOSCOPY WITH STENT REPLACEMENT;  Surgeon: Nickie Retort, MD;  Location: [facility];  Service: Urology;  Laterality: Left;   CYSTOSCOPY WITH RETROGRADE PYELOGRAM, URETEROSCOPY AND STENT PLACEMENT Left 03/08/2016   Procedure: CYSTOSCOPY WITH  LEFT RETROGRADE PYELOGRAM, AND STENT PLACEMENT;  Surgeon: Nickie Retort, MD;  Location: [facility];  Service: Urology;  Laterality: Left;   CYSTOSCOPY/RETROGRADE/URETEROSCOPY/STONE  EXTRACTION WITH BASKET Left 03/29/2016   Procedure: CYSTOSCOPY/RETROGRADE/URETEROSCOPY/STONE EXTRACTION WITH BASKET;  Surgeon: Nickie Retort, MD;  Location: [facility];  Service: Urology;  Laterality: Left;   ENDOMETRIAL ABLATION W/ NOVASURE  04-01-2010   ESOPHAGOGASTRODUODENOSCOPY  last one 08-09-2013   KNEE ARTHROSCOPY Left as teen   LASER ABLATION OF THE CERVIX  2012 approx   PORTACATH PLACEMENT Right 03/06/2020   Procedure: INSERTION PORT-A-CATH WITH ULTRASOUND GUIDANCE;  Surgeon: Alphonsa Overall, MD;  Location: [place];  Service: General;  Laterality: Right;   TRANSTHORACIC ECHOCARDIOGRAM  05-19-2006   lvsf normal, ef 55-65%, there was mild flattening of the interventricular septum during diastoli/  RV size at upper limits normal   TUBAL LIGATION     WISDOM TOOTH EXTRACTION  age 71 's    Family History  Problem Relation Age of Onset   Heart disease Father    Lung cancer Father        d. 11   Alcohol  abuse Father    Heart disease Mother    Depression Mother    Anxiety disorder Mother    Drug abuse Brother    Alcohol abuse Brother    Drug abuse Brother    ADD / ADHD Brother    Colon polyps Brother    Cancer Paternal Grandfather        "stomach"   Diabetes Maternal Grandfather    Diabetes Paternal Grandmother    Kidney disease Maternal Uncle    Cirrhosis Cousin        alcoholic   Anxiety disorder Maternal Aunt    Depression Maternal Aunt    Cancer Cousin        maternal; ovarian cancer or other "female" cancer?   Lung cancer Paternal Uncle 3   Throat cancer Cousin        paternal; dx 51s   Lung cancer Cousin        paternal; dx 54s    Social History   Socioeconomic History   Marital status: Single    Spouse name: Not on file   Number of children: Not on file   Years of education: Not on file   Highest education level: Not on file  Occupational History   Not on file  Tobacco Use   Smoking  status: Current Every Day Smoker    Packs/day: 0.50    Years: 22.00    Pack years: 11.00    Types: Cigarettes   Smokeless tobacco: Never Used   Tobacco comment: Recently started a smoking cessation class.   Vaping Use   Vaping Use: Never used  Substance and Sexual Activity   Alcohol use: Not Currently    Alcohol/week: 0.0 standard drinks    Comment:  hx alcohollism  in remission since 2014   Drug use: No    Comment: hx  marijuana use   Sexual activity: Yes    Partners: Male    Birth control/protection: Surgical  Other Topics Concern   Not on file  Social History Narrative   Former [facility] patient.      Was on disability at one point.   Return to the workforce.  40 hours a week at [employer], 10 hours a week on the weekends at [employer] in [place] at night 10 to 12 hours a week.      Has grown children, she lives alone with a pet, continues to smoke no alcohol or drug use at this time      History of EtOH abuse and THC use.         Social Determinants of Health   Financial Resource Strain:    Difficulty of Paying Living Expenses: Not on file  Food Insecurity: No Food Insecurity   Worried About Charity fundraiser in the Last Year: Never true   Ran Out of Food in the Last Year: Never true  Transportation Needs: No Transportation Needs   Lack of Transportation (Medical): No   Lack of Transportation (Non-Medical): No  Physical Activity:    Days of Exercise per Week: Not on file   Minutes of Exercise per Session: Not on file  Stress:    Feeling of Stress : Not on file  Social Connections:    Frequency of Communication with Friends and Family: Not on file   Frequency of Social Gatherings with Friends and Family: Not on file   Attends Religious Services: Not on file   Active Member of Clubs or Organizations: Not on  file   Attends Archivist Meetings: Not on file   Marital Status: Not on file  Intimate Partner  Violence:    Fear of Current or Ex-Partner: Not on file   Emotionally Abused: Not on file   Physically Abused: Not on file   Sexually Abused: Not on file    Past Medical History, Surgical history, Social history, and Family history were reviewed and updated as appropriate.   Please see review of systems for further details on the patient's review from today.   Review of Systems:  Review of Systems  Constitutional: Positive for appetite change. Negative for chills, diaphoresis and fever.  HENT: Negative for trouble swallowing.   Respiratory: Negative for cough, choking, shortness of breath and wheezing.   Cardiovascular: Negative for chest pain and palpitations.  Gastrointestinal: Positive for nausea and vomiting. Negative for constipation and diarrhea.  Genitourinary: Negative for decreased urine volume.  Neurological: Negative for headaches.    Objective:   Physical Exam:  BP 111/89    Pulse (!) 108    Temp 98.6 F (37 C) (Oral)    Resp 20    SpO2 100%  ECOG: 1  Physical Exam Constitutional:      General: She is not in acute distress.    Appearance: She is not diaphoretic.  HENT:     Head: Normocephalic and atraumatic.  Eyes:     General: No scleral icterus.       Right eye: No discharge.        Left eye: No discharge.     Conjunctiva/sclera: Conjunctivae normal.  Cardiovascular:     Rate and Rhythm: Normal rate and regular rhythm.     Heart sounds: Normal heart sounds. No murmur heard.  No friction rub. No gallop.   Pulmonary:     Effort: Pulmonary effort is normal. No respiratory distress.     Breath sounds: Normal breath sounds. No stridor. No wheezing or rales.  Abdominal:     General: Bowel sounds are normal. There is no distension.     Palpations: Abdomen is soft. There is no mass.     Tenderness: There is no abdominal tenderness. There is no guarding or rebound.  Skin:    General: Skin is warm and dry.  Neurological:     Mental Status: She is alert.      Coordination: Coordination normal.     Gait: Gait normal.  Psychiatric:        Mood and Affect: Mood normal.        Behavior: Behavior normal.        Thought Content: Thought content normal.        Judgment: Judgment normal.     Lab Review:     Component Value Date/Time   NA 134 (L) 06/04/2020 1408   NA 141 10/09/2018 1623   K 3.1 (L) 06/04/2020 1408   CL 99 06/04/2020 1408   CO2 22 06/04/2020 1408   GLUCOSE 124 (H) 06/04/2020 1408   BUN 10 06/04/2020 1408   BUN 12 10/09/2018 1623   CREATININE 0.76 06/04/2020 1408   CREATININE 1.01 11/10/2016 1012   CALCIUM 9.7 06/04/2020 1408   PROT 8.4 (H) 06/04/2020 1408   PROT 7.0 10/09/2018 1623   ALBUMIN 4.2 06/04/2020 1408   ALBUMIN 4.2 10/09/2018 1623   AST 37 06/04/2020 1408   ALT 46 (H) 06/04/2020 1408   ALKPHOS 59 06/04/2020 1408   BILITOT 0.9 06/04/2020 1408   GFRNONAA >60 06/04/2020 1408  GFRNONAA 69 11/10/2016 1012   GFRAA >60 06/04/2020 1408   GFRAA 79 11/10/2016 1012       Component Value Date/Time   WBC 6.1 06/04/2020 1408   WBC 3.5 (L) 04/11/2020 0321   RBC 3.54 (L) 06/04/2020 1408   HGB 11.3 (L) 06/04/2020 1408   HGB 12.9 07/09/2018 0959   HCT 33.5 (L) 06/04/2020 1408   HCT 37.2 07/09/2018 0959   PLT 261 06/04/2020 1408   PLT 356 07/09/2018 0959   MCV 94.6 06/04/2020 1408   MCV 84 07/09/2018 0959   MCH 31.9 06/04/2020 1408   MCHC 33.7 06/04/2020 1408   RDW 15.7 (H) 06/04/2020 1408   RDW 13.7 07/09/2018 0959   LYMPHSABS 1.2 06/04/2020 1408   MONOABS 0.1 06/04/2020 1408   EOSABS 0.0 06/04/2020 1408   BASOSABS 0.1 06/04/2020 1408   -------------------------------  Imaging from last 24 hours (if applicable):  Radiology interpretation: No results found.

## 2020-06-04 NOTE — Patient Instructions (Signed)

## 2020-06-04 NOTE — Assessment & Plan Note (Deleted)
Breast cancer and vulvar cancers  02/20/2020:PET scan on 02/07/20 following a diagnosis of vulvar cancer showed a 1.7cm left breast mass and mildly hypermetabolic left axillary lymph nodes. Mammogram and [place] on 02/18/20 showed a 3.1cm left breast mass at the 11 o'clock position, palpable on exam, and a single abnormal left axillary lymph node with cortical thickening. Left breast biopsy on 02/20/20 showed invasive and in situ carcinoma in the breast and axilla, grade 3, HER-2 positive (3+), ER/PR+ 95%, Ki67 75%  Treatment plan: 1.Neoadjuvant chemoradiation for vulvar cancer with Taxol, carboplatin, Herceptin weekly x6 cycles 2.neoadjuvant chemotherapy with TCH Perjeta x3 cycles 3.Evaluation for breast conserving surgery if possible with sentinel lymph node study 3. Followed by adjuvant radiation therapy if patient had lumpectomy ------------------------------------------------------------------------------------------------------------------------------- Current treatment: Completed 6 cycles ofTaxol carboplatin Herceptin, weeklywith radiation  Today cycle 1 day 8 of TCH Perjeta (plan to give 3 cycles) started 05/29/2020 Echocardiogram: 02/23/2020: EF 55 to 60%  Chemo toxicities: Chemotherapy-induced nausea: Managed with antiemetics. Leukopenia: Monitoring closely Diarrhea: Currently on Lomotil and Imodium. Severe fatigue: Now that the radiation component is over she is going to receive neoadjuvant chemo for breast cancer. Hypokalemia: Currently on potassium replacement therapy. Muscle pains: Probably related to electrolyte imbalance.  Return to clinic in 2 weeks for cycle 2

## 2020-06-04 NOTE — Patient Instructions (Signed)

## 2020-06-04 NOTE — Telephone Encounter (Signed)
Received call from pt with complaint of vomiting x 24 hours.  Pt states she is feeling extremely weak and tired.  Per MD pt needing to be seen in [facility] today for further evaluation.  High priority message sent to scheduling.

## 2020-06-04 NOTE — Telephone Encounter (Signed)
Pt. Present today in symptom management for nausea and vomiting. Pt. States vomiting started yesterday. She stated she has been taking ativan and phenergan. With no relief. Port accessed IV fluids started. Pt. Seen by Sandi Mealy PA antiemetics ordered (see Mar) Pt. Had little relief from vomiting. She stated she had some burning in her stomach and chest Per Sandi Mealy PA given Mylanta. Pt. Tolerated medications given. IV fluids completed. Port de-accessed and heparin locked. Pt. Discharged v/s stable.

## 2020-06-05 ENCOUNTER — Telehealth: Payer: Self-pay

## 2020-06-05 NOTE — Telephone Encounter (Signed)
F/U TC to Pt to see how she was doing today with nausea and vomiting. Pt. Stated she is not feeling to great but the vomiting has stopped. Informed Pt. To stay hydrated. Pt verbalized she is drinking a lot of water. Pt said Thank you for the follow up call.

## 2020-06-07 NOTE — Progress Notes (Signed)
Radiation Oncology         [phone]) [phone] ________________________________  Name: Sandra Miller MRN: [id number]  Date: 06/08/2020  DOB: Jan 01, 1976  Follow-Up Visit Note  CC: Rutherford Guys, MD  Everitt Amber, MD    ICD-10-CM   1. Vulvar cancer [facility])  C51.9   2. 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 Since the end of treatment, the patient was seen by Dr. Lindi Adie on 05/26/2020 for concurrent left breast cancer. She completed six cycles of Taxol, Carboplatin, and Herceptin weekly with radiation. She tolerated treatment relatively well with the exception of chemotherapy-induced nausea managed with antiemetics, leukopenia, diarrhea managed with Lomotil and Imodium, and fatigue. She began adjuvant chemotherapy with TCH Perjeta x3 cycles on 05/26/2020.  On review of systems, she reports diarrhea secondary to chemotherapy and ongoing malodorous discharge from right side of vagina. She denies pelvic pain, vaginal bleeding, rectal bleeding, and changes in bladder patterns.  ALLERGIES:  is allergic to depakote [divalproex sodium], minocycline, and aspirin.  Meds: Current Outpatient Medications  Medication Sig Dispense Refill  . acetaminophen (TYLENOL) 500 MG tablet Take 1,000 mg by mouth every 6 (six) hours as needed for moderate pain or fever.    . diphenoxylate-atropine (LOMOTIL) 2.5-0.025 MG tablet TAKE 1 TO 2 TABLETS BY MOUTH FOUR TIMES DAILY AS NEEDED FOR DIARRHEA 45 tablet 1  .  dronabinol (MARINOL) 2.5 MG capsule Take 1 capsule (2.5 mg total) by mouth 2 (two) times daily before a meal. 60 capsule 0  . gabapentin (NEURONTIN) 300 MG capsule Take 1 capsule (300 mg total) by mouth daily. 90 capsule 3  . lidocaine (XYLOCAINE) 5 % ointment Apply 1 application topically 3 (three) times daily as needed. To affected area 35.44 g 0  . lidocaine-prilocaine (EMLA) cream Apply 1 application topically as needed (to port).    Marland Kitchen lisinopril-hydrochlorothiazide (ZESTORETIC) 20-12.5 MG tablet Take 1 tablet by mouth daily. 90 tablet 1  . LORazepam (ATIVAN) 1 MG tablet Take 1 tablet (1 mg total) by mouth 3 (three) times daily as needed for anxiety. 90 tablet 2  . Multiple Vitamin (MULTIVITAMIN) tablet Take 1 tablet by mouth daily.    Marland Kitchen OLANZapine (ZYPREXA) 15 MG tablet Take 1 tablet (15 mg total) by mouth at bedtime. 90 tablet 0  . omeprazole (PRILOSEC) 40 MG capsule TAKE 1 CAPSULE BY MOUTH TWO TIMES DAILY BEFORE A MEAL 180 capsule 0  . potassium chloride SA (KLOR-CON) 20 MEQ tablet Take 1 tablet (20 mEq total) by mouth 2 (two) times daily. 60 tablet 2  . predniSONE (DELTASONE) 5 MG tablet Take 1 tablet (5 mg total) by mouth in the morning and at bedtime. Take with food 60 tablet 1  . Probiotic Product (PROBIOTIC DAILY PO) Take 1 tablet by mouth daily.     . promethazine (PHENERGAN) 25 MG tablet Take 1 tablet (25 mg total) by mouth every 6 (six) hours as needed for nausea. 30 tablet 3  . rivaroxaban (XARELTO) 20 MG TABS tablet Take 1  tablet (20 mg total) by mouth daily with supper. 30 tablet 6  . topiramate (TOPAMAX) 25 MG tablet TAKE 1 TABLET BY MOUTH AT BEDTIME (Patient taking differently: Take 25 mg by mouth at bedtime. ) 90 tablet 0  . traZODone (DESYREL) 100 MG tablet Take 1 tablet (100 mg total) by mouth at bedtime. 90 tablet 0  . venlafaxine XR (EFFEXOR XR) 37.5 MG 24 hr capsule Take three capsule (112.5 mg total) by mouth daily. 90 capsule 2   No current facility-administered  medications for this encounter.   Facility-Administered Medications Ordered in Other Encounters  Medication Dose Route Frequency Provider Last Rate Last Admin  . LORazepam (ATIVAN) injection 0.5 mg  0.5 mg Intravenous Once Harle Stanford., PA-C        Physical Findings: The patient is in no acute distress. Patient is alert and oriented.  height is 5' 5.5" (1.664 m) and weight is 188 lb 9.6 oz (85.5 kg). Her temperature is 99.5 F (37.5 C). Her blood pressure is 124/83 and her pulse is 110 (abnormal). Her respiration is 18 and oxygen saturation is 100%.  No significant changes. Lungs are clear to auscultation bilaterally. Heart has regular rate and rhythm. No palpable cervical, supraclavicular, or axillary adenopathy. Abdomen soft, non-tender, normal bowel sounds. Unfortunately the patient did not have time to stay for pelvic exam but will be scheduled for early next week  Lab Findings: Lab Results  Component Value Date   WBC 6.1 06/04/2020   HGB 11.3 (L) 06/04/2020   HCT 33.5 (L) 06/04/2020   MCV 94.6 06/04/2020   PLT 261 06/04/2020    Radiographic Findings: No results found.  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  She has ongoing side effects related to her chemotherapy for breast cancer. Vaginal  Discharge uses as above  Plan: The patient continues with neoadjuvant chemotherapy with [facility] Perjeta x3 cycles under the care of Dr. Lindi Adie. Her next cycle begins on 06/19/2020. She will follow-up with radiation oncology in 1 week for pelvic exam given her ongoing discharge issues.  Patient will otherwise follow-up with Dr. Denman George in 2 months.   ____________________________________   Blair Promise, PhD, MD  This document serves as a record of services personally performed by Gery Pray, MD. It was created on his behalf by Clerance Lav, a trained medical scribe. The  creation of this record is based on the scribe's personal observations and the provider's statements to them. This document has been checked and approved by the attending provider.

## 2020-06-08 ENCOUNTER — Encounter: Payer: Self-pay | Admitting: Radiation Oncology

## 2020-06-08 ENCOUNTER — Other Ambulatory Visit: Payer: Self-pay

## 2020-06-08 ENCOUNTER — Ambulatory Visit
Admission: RE | Admit: 2020-06-08 | Discharge: 2020-06-08 | Disposition: A | Discharge status: Home / Self Care | Payer: Self-pay | Source: Ambulatory Visit | Attending: Radiation Oncology | Admitting: Radiation Oncology

## 2020-06-08 VITALS — BP 124/83 | HR 110 | Temp 99.5°F | Resp 18 | Ht 65.5 in | Wt 188.6 lb

## 2020-06-08 DIAGNOSIS — C50212 Malignant neoplasm of upper-inner quadrant of left female breast: Secondary | ICD-10-CM

## 2020-06-08 DIAGNOSIS — C50512 Malignant neoplasm of lower-outer quadrant of left female breast: Secondary | ICD-10-CM | POA: Insufficient documentation

## 2020-06-08 DIAGNOSIS — R197 Diarrhea, unspecified: Secondary | ICD-10-CM | POA: Insufficient documentation

## 2020-06-08 DIAGNOSIS — Z923 Personal history of irradiation: Secondary | ICD-10-CM | POA: Insufficient documentation

## 2020-06-08 DIAGNOSIS — Z79899 Other long term (current) drug therapy: Secondary | ICD-10-CM | POA: Insufficient documentation

## 2020-06-08 DIAGNOSIS — C519 Malignant neoplasm of vulva, unspecified: Secondary | ICD-10-CM | POA: Insufficient documentation

## 2020-06-08 DIAGNOSIS — Z7901 Long term (current) use of anticoagulants: Secondary | ICD-10-CM | POA: Insufficient documentation

## 2020-06-08 DIAGNOSIS — R5383 Other fatigue: Secondary | ICD-10-CM | POA: Insufficient documentation

## 2020-06-08 DIAGNOSIS — R110 Nausea: Secondary | ICD-10-CM | POA: Insufficient documentation

## 2020-06-08 DIAGNOSIS — Z170 Estrogen receptor positive status [ER+]: Secondary | ICD-10-CM | POA: Insufficient documentation

## 2020-06-08 LAB — GENETIC SCREENING ORDER

## 2020-06-08 NOTE — Progress Notes (Addendum)
Patient here for a 1 month f/u visit with Dr. Sondra Come. Patient denies bleeding, pelvic pain, vaginal bleeding or bladder problems. She reports having a UTI a few months ago. She reports diarrhea from her chemo. States she has had an ongoing slow discharge from the right side of her vagina that is painful and has a bad smell.  BP 124/83 (BP Location: Right Arm, Patient Position: Sitting, Cuff Size: Normal)   Pulse (!) 110   Temp 99.5 F (37.5 C)   Resp 18   Ht 5' 5.5" (1.664 m)   Wt 188 lb 9.6 oz (85.5 kg)   SpO2 100%   BMI 30.91 kg/m   Wt Readings from Last 3 Encounters:  06/08/20 188 lb 9.6 oz (85.5 kg)  05/26/20 190 lb 8 oz (86.4 kg)  04/29/20 190 lb 9.6 oz (86.5 kg)      Home Care Instructions for the Insertion and Care of Your Vaginal Dilator  Why Do I Need a Vaginal Dilator?  Internal radiation therapy may cause scar tissue to form at the top of your vagina (vaginal cuff).  This may make vaginal examinations difficult in the future. You can prevent scar tissue from forming by using a vaginal dilator (a smooth plastic rod), and/or by having regular sexual intercourse.  If not using the dilator you should be having intercourse two or three times a week.  If you are unable to have intercourse, you should use your vaginal dilator.  You may have some spotting or bleeding from your dilator or intercourse the first few times. You may also have some discomfort. If discomfort occurs with intercourse, you and your partner may need to stop for a while and try again later.  How to Use Your Vaginal Dilator  - Wash the dilator with soap and water before and after each use. - Check the dilator to be sure it is smooth. Do not use the dilator if you find any roughspots. - Coat the dilator with K-Y Jelly, Astroglide, or Replens. Do not use Vaseline, baby oil, or other oil based lubricants. They are not water-soluble and can be irritating to the tissues in the vagina. - Lie on your back  with your knees bent and legs apart. - Insert the rounded end of the dilator into your vagina as far as it will go without causing pain or discomfort. - Close your knees and slowly straighten your legs. - Keep the dilator in your vagina for about 10 to 15 minutes.  Please use 3 times a week, for example: Monday, Wednesday and Friday evenings. [facility] your knees, open your legs, and gently remove the dilator. - Gently cleanse the skin around the vaginal opening. - Wash the dilator after each use. -  It is important that you use the dilator routinely until instructed otherwise by your doctor. Patient given written and verbal instructions of the above. Patient provided with a xs+ and a s dilator.

## 2020-06-09 ENCOUNTER — Ambulatory Visit: Payer: No Typology Code available for payment source

## 2020-06-09 ENCOUNTER — Other Ambulatory Visit: Payer: No Typology Code available for payment source

## 2020-06-09 ENCOUNTER — Other Ambulatory Visit: Payer: No Typology Code available for payment source | Admitting: Licensed Clinical Social Worker

## 2020-06-09 ENCOUNTER — Ambulatory Visit: Payer: No Typology Code available for payment source | Admitting: Hematology and Oncology

## 2020-06-09 ENCOUNTER — Telehealth: Payer: Self-pay | Admitting: Genetic Counselor

## 2020-06-09 ENCOUNTER — Telehealth: Payer: Self-pay | Admitting: *Deleted

## 2020-06-09 NOTE — Telephone Encounter (Signed)
Contacted patient to discuss whether she wishes to proceed with genetic testing.  LVM with contact information requesting a call back.

## 2020-06-09 NOTE — Telephone Encounter (Signed)
CALLED PATIENT TO INFORM OF FU WITH DR. KINARD ON 06-16-20 @ 1 PM, LVM FOR A RETURN CALL

## 2020-06-11 NOTE — Progress Notes (Signed)
After considering the risks, benefits, and limitations, [name] provided informed consent to pursue genetic testing and the blood sample was sent to [facility] for analysis of the Common Hereditary Cancers Panel. Results should be available within approximately 3 weeks' time, at which point they will be disclosed by telephone to [name], as will any additional recommendations warranted by these results. [name] will receive a summary of her genetic counseling visit and a copy of her results once available. This information will also be available in Epic.

## 2020-06-11 NOTE — Telephone Encounter (Signed)
After considering the risks, benefits, and limitations, Ms. Vandenberg provided informed consent to pursue genetic testing and the blood sample was sent to [facility] for analysis of the Common Hereditary Cancers Panel. Results should be available within approximately 3 weeks' time, at which point they will be disclosed by telephone to Ms. Croucher, as will any additional recommendations warranted by these results. Ms. Kreiter will receive a summary of her genetic counseling visit and a copy of her results once available. This information will also be available in Epic.

## 2020-06-12 ENCOUNTER — Ambulatory Visit (HOSPITAL_BASED_OUTPATIENT_CLINIC_OR_DEPARTMENT_OTHER): Payer: Self-pay | Admitting: Medical

## 2020-06-12 ENCOUNTER — Telehealth: Payer: Self-pay | Admitting: *Deleted

## 2020-06-12 ENCOUNTER — Other Ambulatory Visit: Payer: Self-pay | Admitting: Medical

## 2020-06-12 ENCOUNTER — Other Ambulatory Visit: Payer: Self-pay | Admitting: *Deleted

## 2020-06-12 DIAGNOSIS — Z170 Estrogen receptor positive status [ER+]: Secondary | ICD-10-CM

## 2020-06-12 DIAGNOSIS — C50212 Malignant neoplasm of upper-inner quadrant of left female breast: Secondary | ICD-10-CM

## 2020-06-12 DIAGNOSIS — I82C11 Acute embolism and thrombosis of right internal jugular vein: Secondary | ICD-10-CM | POA: Insufficient documentation

## 2020-06-12 MED ORDER — RIVAROXABAN 20 MG PO TABS
20.0000 mg | ORAL_TABLET | Freq: Every day | ORAL | 0 refills | Status: DC
Start: 2020-06-12 — End: 2020-06-16

## 2020-06-12 MED FILL — XARELTO 20 MG TABLET: 20 | 7 days supply | Qty: 7 | Fill #0

## 2020-06-12 NOTE — Telephone Encounter (Signed)
Pt called stating she no longer has insurance and can not afford Xarelto due to being uninsured. She only had 2 doses left and wanted to know if there were cheaper anticoagulants that she can take. Per Lucianne Lei, [place] recommendations, pt can come in to have labs drawn and be started on coumadin until she reaches therapeutic levels. Pt stated she could not come in due to vaginal issues. Pt then called asking to come in at a later time after changing her mind. We could not accommodate due to lab closing.  Advised pt to go to the ED so that her dose will not be missed after Saturday. Lucianne Lei, [place] sent in 1 week supply of Xarelto to [facility] until pt is able to see Dr.Gudena or Lindsey,NP for a f/u. Pt made aware and verbalized understanding.

## 2020-06-12 NOTE — Progress Notes (Signed)
The patient has a history of an occlusive thrombus within the mid to inferior right internal jugular vein with a thrombus likely extending to surrounding portions of the right IJ approach fusion port catheter. This dates to a CT scan that was completed on her neck on 04/11/2020. She was placed on Xarelto. She contacted her office today stating that she had 2 additional days of Xarelto left but could not afford this medication any longer. Unfortunately she could not come in today to be placed on a Lovenox bridge and started on Coumadin. Based on this she was given prescriptions of Xarelto to last her for 1 additional week and then will be seen by Dr. Lindi Adie next Friday.  Sandi Mealy, MHS, PA-C Physician Assistant

## 2020-06-16 ENCOUNTER — Emergency Department (HOSPITAL_COMMUNITY): Payer: Medicare Other

## 2020-06-16 ENCOUNTER — Inpatient Hospital Stay: Payer: Self-pay | Attending: Gynecologic Oncology | Admitting: Hematology and Oncology

## 2020-06-16 ENCOUNTER — Telehealth: Payer: Self-pay

## 2020-06-16 ENCOUNTER — Other Ambulatory Visit: Payer: Self-pay | Admitting: Hematology and Oncology

## 2020-06-16 ENCOUNTER — Inpatient Hospital Stay (HOSPITAL_COMMUNITY)
Admission: EM | Admit: 2020-06-16 | Discharge: 2020-06-19 | DRG: 392 | Disposition: A | Discharge status: Home / Self Care | Payer: Medicare Other | Attending: Internal Medicine | Admitting: Internal Medicine

## 2020-06-16 ENCOUNTER — Ambulatory Visit
Admission: RE | Admit: 2020-06-16 | Discharge: 2020-06-16 | Disposition: A | Discharge status: Home / Self Care | Payer: Self-pay | Source: Ambulatory Visit | Attending: Radiation Oncology | Admitting: Radiation Oncology

## 2020-06-16 ENCOUNTER — Encounter (HOSPITAL_COMMUNITY): Payer: Self-pay

## 2020-06-16 DIAGNOSIS — C519 Malignant neoplasm of vulva, unspecified: Secondary | ICD-10-CM

## 2020-06-16 DIAGNOSIS — Z923 Personal history of irradiation: Secondary | ICD-10-CM | POA: Insufficient documentation

## 2020-06-16 DIAGNOSIS — K529 Noninfective gastroenteritis and colitis, unspecified: Principal | ICD-10-CM | POA: Diagnosis present

## 2020-06-16 DIAGNOSIS — Z888 Allergy status to other drugs, medicaments and biological substances status: Secondary | ICD-10-CM

## 2020-06-16 DIAGNOSIS — Z8249 Family history of ischemic heart disease and other diseases of the circulatory system: Secondary | ICD-10-CM

## 2020-06-16 DIAGNOSIS — Z170 Estrogen receptor positive status [ER+]: Secondary | ICD-10-CM

## 2020-06-16 DIAGNOSIS — F429 Obsessive-compulsive disorder, unspecified: Secondary | ICD-10-CM | POA: Diagnosis present

## 2020-06-16 DIAGNOSIS — E860 Dehydration: Secondary | ICD-10-CM | POA: Diagnosis not present

## 2020-06-16 DIAGNOSIS — F411 Generalized anxiety disorder: Secondary | ICD-10-CM | POA: Diagnosis present

## 2020-06-16 DIAGNOSIS — G2581 Restless legs syndrome: Secondary | ICD-10-CM | POA: Diagnosis present

## 2020-06-16 DIAGNOSIS — Z7901 Long term (current) use of anticoagulants: Secondary | ICD-10-CM

## 2020-06-16 DIAGNOSIS — I959 Hypotension, unspecified: Secondary | ICD-10-CM | POA: Diagnosis present

## 2020-06-16 DIAGNOSIS — F1021 Alcohol dependence, in remission: Secondary | ICD-10-CM | POA: Diagnosis present

## 2020-06-16 DIAGNOSIS — I82621 Acute embolism and thrombosis of deep veins of right upper extremity: Secondary | ICD-10-CM

## 2020-06-16 DIAGNOSIS — Z86718 Personal history of other venous thrombosis and embolism: Secondary | ICD-10-CM | POA: Insufficient documentation

## 2020-06-16 DIAGNOSIS — Z818 Family history of other mental and behavioral disorders: Secondary | ICD-10-CM

## 2020-06-16 DIAGNOSIS — I10 Essential (primary) hypertension: Secondary | ICD-10-CM | POA: Diagnosis present

## 2020-06-16 DIAGNOSIS — Z811 Family history of alcohol abuse and dependence: Secondary | ICD-10-CM

## 2020-06-16 DIAGNOSIS — C50212 Malignant neoplasm of upper-inner quadrant of left female breast: Secondary | ICD-10-CM | POA: Insufficient documentation

## 2020-06-16 DIAGNOSIS — E876 Hypokalemia: Secondary | ICD-10-CM | POA: Diagnosis present

## 2020-06-16 DIAGNOSIS — K219 Gastro-esophageal reflux disease without esophagitis: Secondary | ICD-10-CM | POA: Diagnosis present

## 2020-06-16 DIAGNOSIS — I82409 Acute embolism and thrombosis of unspecified deep veins of unspecified lower extremity: Secondary | ICD-10-CM | POA: Insufficient documentation

## 2020-06-16 DIAGNOSIS — Z87442 Personal history of urinary calculi: Secondary | ICD-10-CM

## 2020-06-16 DIAGNOSIS — F4310 Post-traumatic stress disorder, unspecified: Secondary | ICD-10-CM | POA: Diagnosis present

## 2020-06-16 DIAGNOSIS — J410 Simple chronic bronchitis: Secondary | ICD-10-CM | POA: Diagnosis present

## 2020-06-16 DIAGNOSIS — Z881 Allergy status to other antibiotic agents status: Secondary | ICD-10-CM

## 2020-06-16 DIAGNOSIS — Z20822 Contact with and (suspected) exposure to covid-19: Secondary | ICD-10-CM | POA: Diagnosis present

## 2020-06-16 DIAGNOSIS — Z79899 Other long term (current) drug therapy: Secondary | ICD-10-CM

## 2020-06-16 DIAGNOSIS — I82C19 Acute embolism and thrombosis of unspecified internal jugular vein: Secondary | ICD-10-CM | POA: Diagnosis present

## 2020-06-16 DIAGNOSIS — F319 Bipolar disorder, unspecified: Secondary | ICD-10-CM | POA: Diagnosis present

## 2020-06-16 DIAGNOSIS — E861 Hypovolemia: Secondary | ICD-10-CM | POA: Diagnosis present

## 2020-06-16 DIAGNOSIS — Z9221 Personal history of antineoplastic chemotherapy: Secondary | ICD-10-CM | POA: Insufficient documentation

## 2020-06-16 DIAGNOSIS — Z7952 Long term (current) use of systemic steroids: Secondary | ICD-10-CM

## 2020-06-16 DIAGNOSIS — Z8711 Personal history of peptic ulcer disease: Secondary | ICD-10-CM

## 2020-06-16 DIAGNOSIS — F17210 Nicotine dependence, cigarettes, uncomplicated: Secondary | ICD-10-CM | POA: Diagnosis present

## 2020-06-16 DIAGNOSIS — R112 Nausea with vomiting, unspecified: Secondary | ICD-10-CM

## 2020-06-16 LAB — CBC WITH DIFFERENTIAL/PLATELET
Abs Immature Granulocytes: 0.27 K/uL — ABNORMAL HIGH (ref 0.00–0.07)
Basophils Absolute: 0 K/uL (ref 0.0–0.1)
Basophils Relative: 0 %
Eosinophils Absolute: 0 K/uL (ref 0.0–0.5)
Eosinophils Relative: 0 %
HCT: 35.4 % — ABNORMAL LOW (ref 36.0–46.0)
Hemoglobin: 11.9 g/dL — ABNORMAL LOW (ref 12.0–15.0)
Immature Granulocytes: 1 %
Lymphocytes Relative: 6 %
Lymphs Abs: 1.1 K/uL (ref 0.7–4.0)
MCH: 32.7 pg (ref 26.0–34.0)
MCHC: 33.6 g/dL (ref 30.0–36.0)
MCV: 97.3 fL (ref 80.0–100.0)
Monocytes Absolute: 1 K/uL (ref 0.1–1.0)
Monocytes Relative: 5 %
Neutro Abs: 17.1 K/uL — ABNORMAL HIGH (ref 1.7–7.7)
Neutrophils Relative %: 88 %
Platelets: 317 K/uL (ref 150–400)
RBC: 3.64 MIL/uL — ABNORMAL LOW (ref 3.87–5.11)
RDW: 15.8 % — ABNORMAL HIGH (ref 11.5–15.5)
WBC: 19.4 K/uL — ABNORMAL HIGH (ref 4.0–10.5)
nRBC: 0 % (ref 0.0–0.2)

## 2020-06-16 LAB — COMPREHENSIVE METABOLIC PANEL WITH GFR
ALT: 26 U/L (ref 0–44)
AST: 65 U/L — ABNORMAL HIGH (ref 15–41)
Albumin: 3.9 g/dL (ref 3.5–5.0)
Alkaline Phosphatase: 38 U/L (ref 38–126)
Anion gap: 13 (ref 5–15)
BUN: 12 mg/dL (ref 6–20)
CO2: 23 mmol/L (ref 22–32)
Calcium: 9.5 mg/dL (ref 8.9–10.3)
Chloride: 102 mmol/L (ref 98–111)
Creatinine, Ser: 0.83 mg/dL (ref 0.44–1.00)
GFR, Estimated: >60 mL/min
Glucose, Bld: 169 mg/dL — ABNORMAL HIGH (ref 70–99)
Potassium: 2.9 mmol/L — ABNORMAL LOW (ref 3.5–5.1)
Sodium: 138 mmol/L (ref 135–145)
Total Bilirubin: 0.9 mg/dL (ref 0.3–1.2)
Total Protein: 7.2 g/dL (ref 6.5–8.1)

## 2020-06-16 LAB — I-STAT BETA HCG BLOOD, ED (MC, WL, AP ONLY): I-stat hCG, quantitative: <5 m[IU]/mL (ref <5)

## 2020-06-16 LAB — LIPASE, BLOOD: Lipase: 32 U/L (ref 11–51)

## 2020-06-16 MED ORDER — IOHEXOL 300 MG/ML  SOLN
100.0000 mL | Freq: Once | INTRAMUSCULAR | Status: AC | PRN
  Administered 2020-06-16: 100 mL via INTRAVENOUS

## 2020-06-16 MED ORDER — LACTATED RINGERS IV BOLUS
1000.0000 mL | Freq: Once | INTRAVENOUS | Status: AC
  Administered 2020-06-16: 1000 mL via INTRAVENOUS

## 2020-06-16 MED ORDER — POTASSIUM CHLORIDE 10 MEQ/100ML IV SOLN
10.0000 meq | Freq: Once | INTRAVENOUS | Status: AC
  Administered 2020-06-16: 10 meq via INTRAVENOUS
  Filled 2020-06-16: qty 100

## 2020-06-16 MED ORDER — PROMETHAZINE HCL 25 MG/ML IJ SOLN
12.5000 mg | Freq: Once | INTRAMUSCULAR | Status: AC
  Administered 2020-06-16: 12.5 mg via INTRAVENOUS
  Filled 2020-06-16: qty 1

## 2020-06-16 MED ORDER — HYDROMORPHONE HCL 1 MG/ML IJ SOLN: 0.5000 mg | Freq: Once | INTRAMUSCULAR | Status: DC

## 2020-06-16 MED ORDER — SODIUM CHLORIDE (PF) 0.9 % IJ SOLN
INTRAMUSCULAR | Status: AC
  Administered 2020-06-16: 10 mL
  Filled 2020-06-16: qty 50

## 2020-06-16 MED ORDER — HYDROMORPHONE HCL 1 MG/ML IJ SOLN
0.5000 mg | Freq: Once | INTRAMUSCULAR | Status: AC
  Administered 2020-06-16: 0.5 mg via INTRAVENOUS
  Filled 2020-06-16: qty 1

## 2020-06-16 MED ORDER — WARFARIN SODIUM 5 MG PO TABS: 5.0000 mg | ORAL_TABLET | Freq: Every day | ORAL | 1 refills | Status: DC

## 2020-06-16 MED FILL — WARFARIN SODIUM 5 MG TABLET: 5 | 30 days supply | Qty: 30 | Fill #0

## 2020-06-16 MED FILL — PROCHLORPERAZINE 10 MG TAB: 10 | 7 days supply | Qty: 30 | Fill #0

## 2020-06-16 MED FILL — VENLAFAXINE HCL ER 37.5 MG: 37.5 | 30 days supply | Qty: 90 | Fill #2

## 2020-06-16 NOTE — Progress Notes (Signed)
Patient Care Team: Rutherford Guys, MD as PCP - General (Family Medicine) Mauro Kaufmann, RN as Oncology Nurse Navigator Rockwell Germany, RN as Oncology Nurse Navigator Nicholas Lose, MD as Consulting Physician (Hematology and Oncology) Alphonsa Overall, MD as Consulting Physician (General Surgery) Awanda Mink Craige Cotta, RN as Oncology Nurse Navigator (Oncology)  DIAGNOSIS:  Encounter Diagnoses  Name Primary?  . Vulvar cancer ([place]) Yes  . Malignant neoplasm of upper-inner quadrant of left breast in female, estrogen receptor positive ([place])   . Acute deep vein thrombosis (DVT) of other vein of right upper extremity (HCC)     SUMMARY OF ONCOLOGIC HISTORY: Oncology History  Vulvar cancer ([place])  01/29/2020 Initial Diagnosis   Vulvar cancer ([place])   03/11/2020 - 05/04/2020 Chemotherapy   The patient had dexamethasone (DECADRON) 4 MG tablet, 4 mg (100 % of original dose 4 mg), Oral, Daily, 1 of 1 cycle, Start date: 02/27/2020, End date: 05/05/2020 Dose modification: 4 mg (original dose 4 mg, Cycle 0) palonosetron (ALOXI) injection 0.25 mg, 0.25 mg, Intravenous,  Once, 1 of 1 cycle Administration: 0.25 mg (03/11/2020), 0.25 mg (03/17/2020), 0.25 mg (03/24/2020), 0.25 mg (03/31/2020), 0.25 mg (04/07/2020), 0.25 mg (04/14/2020), 0.25 mg (04/21/2020) CARBOplatin (PARAPLATIN) 300 mg in sodium chloride 0.9 % 250 mL chemo infusion, 300 mg (100 % of original dose 300 mg), Intravenous,  Once, 1 of 1 cycle Dose modification:   (original dose 300 mg, Cycle 1) Administration: 300 mg (03/11/2020), 290 mg (03/17/2020), 300 mg (03/24/2020), 230 mg (03/31/2020), 230 mg (04/07/2020), 230 mg (04/14/2020) PACLitaxel (TAXOL) 108 mg in sodium chloride 0.9 % 250 mL chemo infusion (</= 28m/m2), 50 mg/m2 = 108 mg, Intravenous,  Once, 1 of 1 cycle Dose modification: 45 mg/m2 (original dose 50 mg/m2, Cycle 1, Reason: Dose not tolerated) Administration: 108 mg (03/11/2020), 108 mg (03/17/2020), 108 mg (03/24/2020), 96 mg (03/31/2020), 96 mg  (04/07/2020), 96 mg (04/14/2020) fosaprepitant (EMEND) 150 mg in sodium chloride 0.9 % 145 mL IVPB, 150 mg, Intravenous,  Once, 1 of 1 cycle Administration: 150 mg (03/24/2020), 150 mg (03/31/2020), 150 mg (04/07/2020), 150 mg (04/14/2020) trastuzumab-anns (KANJINTI) 378 mg in sodium chloride 0.9 % 250 mL chemo infusion, 4 mg/kg = 378 mg (100 % of original dose 4 mg/kg), Intravenous,  Once, 1 of 1 cycle Dose modification: 4 mg/kg (original dose 4 mg/kg, Cycle 1, Reason: Other (see comments), Comment: loading dose) Administration: 378 mg (03/11/2020), 189 mg (03/17/2020), 189 mg (03/24/2020), 189 mg (03/31/2020), 189 mg (04/07/2020), 189 mg (04/14/2020), 189 mg (04/21/2020), 189 mg (05/04/2020)  for chemotherapy treatment.    Malignant neoplasm of upper-inner quadrant of left breast in female, [place] receptor positive [place]  02/20/2020 Initial Diagnosis   PET scan on 02/07/20 following a diagnosis of vulvar cancer showed a 1.7cm left breast mass and mildly hypermetabolic left axillary lymph nodes. [place] showed a 3.1cm left breast mass at the 11 o'clock position, palpable on exam, and a single abnormal left axillary lymph node with cortical thickening. Left breast biopsy on 02/20/20 showed invasive and in situ carcinoma in the breast and axilla, grade 3, HER-2 positive (3+), ER/PR+ 95%, Ki67 75%   02/25/2020 Cancer Staging   Staging form: Breast, AJCC 8th Edition - Clinical stage from 02/25/2020: Stage IB (cT2, cN1, cM0, G3, ER+, PR+, HER2+) - Signed by GNicholas Lose MD on 02/25/2020   05/29/2020 -  Chemotherapy   The patient had palonosetron (ALOXI) injection 0.25 mg, 0.25 mg, Intravenous,  Once, 1 of 3 cycles  Administration: 0.25 mg (05/29/2020) pegfilgrastim-jmdb (FULPHILA) injection 6 mg, 6 mg, Subcutaneous,  Once, 1 of 3 cycles CARBOplatin (PARAPLATIN) 580 mg in sodium chloride 0.9 % 250 mL chemo infusion, 580 mg (100 % of original dose 580 mg), Intravenous,  Once, 1 of 3 cycles Dose  modification:   (original dose 580 mg, Cycle 1) Administration: 580 mg (05/29/2020) DOCEtaxel (TAXOTERE) 120 mg in sodium chloride 0.9 % 250 mL chemo infusion, 60 mg/m2 = 120 mg (100 % of original dose 60 mg/m2), Intravenous,  Once, 1 of 3 cycles Dose modification: 60 mg/m2 (original dose 60 mg/m2, Cycle 1, Reason: Provider Judgment) Administration: 120 mg (05/29/2020) fosaprepitant (EMEND) 150 mg in sodium chloride 0.9 % 145 mL IVPB, 150 mg, Intravenous,  Once, 1 of 3 cycles Administration: 150 mg (05/29/2020) pertuzumab (PERJETA) 420 mg in sodium chloride 0.9 % 250 mL chemo infusion, 420 mg (50 % of original dose 840 mg), Intravenous, Once, 1 of 3 cycles Dose modification: 420 mg (original dose 840 mg, Cycle 1, Reason: Provider Judgment) Administration: 420 mg (05/29/2020) trastuzumab-dkst (OGIVRI) 525 mg in sodium chloride 0.9 % 250 mL chemo infusion, 6 mg/kg = 525 mg (75 % of original dose 8 mg/kg), Intravenous,  Once, 1 of 3 cycles Dose modification: 6 mg/kg (original dose 8 mg/kg, Cycle 1, Reason: Provider Judgment) Administration: 525 mg (05/29/2020)  for chemotherapy treatment.      CHIEF COMPLIANT:   INTERVAL HISTORY: Sandra Miller is a   ALLERGIES:  is allergic to depakote [divalproex sodium], minocycline, and aspirin.  MEDICATIONS:  Current Outpatient Medications  Medication Sig Dispense Refill  . acetaminophen (TYLENOL) 500 MG tablet Take 1,000 mg by mouth every 6 (six) hours as needed for moderate pain or fever.    . diphenoxylate-atropine (LOMOTIL) 2.5-0.025 MG tablet TAKE 1 TO 2 TABLETS BY MOUTH FOUR TIMES DAILY AS NEEDED FOR DIARRHEA 45 tablet 1  . dronabinol (MARINOL) 2.5 MG capsule Take 1 capsule (2.5 mg total) by mouth 2 (two) times daily before a meal. 60 capsule 0  . gabapentin (NEURONTIN) 300 MG capsule Take 1 capsule (300 mg total) by mouth daily. 90 capsule 3  . lidocaine (XYLOCAINE) 5 % ointment Apply 1 application topically 3 (three) times daily as needed. To  affected area 35.44 g 0  . lidocaine-prilocaine (EMLA) cream Apply 1 application topically as needed (to port).    Marland Kitchen lisinopril-hydrochlorothiazide (ZESTORETIC) 20-12.5 MG tablet Take 1 tablet by mouth daily. 90 tablet 1  . LORazepam (ATIVAN) 1 MG tablet Take 1 tablet (1 mg total) by mouth 3 (three) times daily as needed for anxiety. 90 tablet 2  . Multiple Vitamin (MULTIVITAMIN) tablet Take 1 tablet by mouth daily.    Marland Kitchen OLANZapine (ZYPREXA) 15 MG tablet Take 1 tablet (15 mg total) by mouth at bedtime. 90 tablet 0  . omeprazole (PRILOSEC) 40 MG capsule TAKE 1 CAPSULE BY MOUTH TWO TIMES DAILY BEFORE A MEAL 180 capsule 0  . potassium chloride SA (KLOR-CON) 20 MEQ tablet Take 1 tablet (20 mEq total) by mouth 2 (two) times daily. 60 tablet 2  . predniSONE (DELTASONE) 5 MG tablet Take 1 tablet (5 mg total) by mouth in the morning and at bedtime. Take with food 60 tablet 1  . Probiotic Product (PROBIOTIC DAILY PO) Take 1 tablet by mouth daily.     . promethazine (PHENERGAN) 25 MG tablet Take 1 tablet (25 mg total) by mouth every 6 (six) hours as needed for nausea. 30 tablet 3  . rivaroxaban (XARELTO)  20 MG TABS tablet Take 1 tablet (20 mg total) by mouth daily with supper for 7 days. 7 tablet 0  . topiramate (TOPAMAX) 25 MG tablet TAKE 1 TABLET BY MOUTH AT BEDTIME (Patient taking differently: Take 25 mg by mouth at bedtime. ) 90 tablet 0  . traZODone (DESYREL) 100 MG tablet Take 1 tablet (100 mg total) by mouth at bedtime. 90 tablet 0  . venlafaxine XR (EFFEXOR XR) 37.5 MG 24 hr capsule Take three capsule (112.5 mg total) by mouth daily. 90 capsule 2   No current facility-administered medications for this visit.   Facility-Administered Medications Ordered in Other Visits  Medication Dose Route Frequency Provider Last Rate Last Admin  . LORazepam (ATIVAN) injection 0.5 mg  0.5 mg Intravenous Once Harle Stanford., PA-C        PHYSICAL EXAMINATION: ECOG PERFORMANCE STATUS: 1 - Symptomatic but  completely ambulatory   LABORATORY DATA:  I have reviewed the data as listed CMP Latest Ref Rng & Units 06/04/2020 05/26/2020 04/21/2020  Glucose 70 - 99 mg/dL 124(H) 106(H) 115(H)  BUN 6 - 20 mg/dL 10 10 8   Creatinine 0.44 - 1.00 mg/dL 0.76 0.73 0.75  Sodium 135 - 145 mmol/L 134(L) 138 137  Potassium 3.5 - 5.1 mmol/L 3.1(L) 3.3(L) 3.1(L)  Chloride 98 - 111 mmol/L 99 105 103  CO2 22 - 32 mmol/L 22 24 24   Calcium 8.9 - 10.3 mg/dL 9.7 10.3 10.4(H)  Total Protein 6.5 - 8.1 g/dL 8.4(H) 7.7 8.0  Total Bilirubin 0.3 - 1.2 mg/dL 0.9 0.5 0.4  Alkaline Phos 38 - 126 U/L 59 56 58  AST 15 - 41 U/L 37 19 20  ALT 0 - 44 U/L 46(H) 20 31    Lab Results  Component Value Date   WBC 6.1 06/04/2020   HGB 11.3 (L) 06/04/2020   HCT 33.5 (L) 06/04/2020   MCV 94.6 06/04/2020   PLT 261 06/04/2020   NEUTROABS 4.7 06/04/2020    ASSESSMENT & PLAN:  DVT (deep venous thrombosis) (HCC) Right internal jugular vein thrombosis surrounding portions of the right IJ port: Started 04/11/2020: Initially treated with Xarelto but since he lost insurance she is unable to afford it.  Treatment plan: Start Coumadin with Lovenox. INR checks weekly.  Malignant neoplasm of upper-inner quadrant of left breast in female, estrogen receptor positive ([place]) Breast cancer and vulvar cancers  02/20/2020:PET scan on 02/07/20 following a diagnosis of vulvar cancer showed a 1.7cm left breast mass and mildly hypermetabolic left axillary lymph nodes. Mammogram and [place] on 02/18/20 showed a 3.1cm left breast mass at the 11 o'clock position, palpable on exam, and a single abnormal left axillary lymph node with cortical thickening. Left breast biopsy on 02/20/20 showed invasive and in situ carcinoma in the breast and axilla, grade 3, HER-2 positive (3+), ER/PR+ 95%, Ki67 75%  Treatment plan: 1.Neoadjuvant chemoradiation for vulvar cancer with Taxol, carboplatin, Herceptin weekly x6 cycles 2.neoadjuvant chemotherapy with TCH Perjeta x3  cycles 3.Evaluation for breast conserving surgery if possible with sentinel lymph node study 3. Followed by adjuvant radiation therapy if patient had lumpectomy ------------------------------------------------------------------------------------------------------------------------------- Current treatment: Completed 6 cycles ofTaxol carboplatin Herceptin, weeklywith radiation Cycle 2 of TCH Perjeta (plan to give 3 cycles)  Echocardiogram: 02/23/2020: EF 55 to 60%  Chemo toxicities: Profound nausea and vomiting: In spite of Phenergan, Zofran, Compazine, Marinol and Zyprexa She has not taken the Marinol but she has tried everything else.  We will reduce the dosage of chemotherapy this Wednesday.  Severe fatigue  and generalized weakness are related to all the chemo that she has received so far.  DVT of jugular vein: She cannot afford Xarelto.  I will send her prescription for Coumadin.  We will check INR when she comes for chemo.  She will then need weekly INR checks.  Return to clinic in 3 weeks for cycle 3 which will be the last cycle of chemotherapy. After that she will go on Herceptin Perjeta maintenance versus Kadcyla maintenance. She will need a breast MRI and surgical consultations after cycle 3.    No orders of the defined types were placed in this encounter.  The patient has a good understanding of the overall plan. she agrees with it. she will call with any problems that may develop before the next visit here. Total time spent: 30 mins including face to face time and time spent for planning, charting and co-ordination of care   Harriette Ohara, MD 06/16/20

## 2020-06-16 NOTE — Assessment & Plan Note (Signed)
Right internal jugular vein thrombosis surrounding portions of the right IJ port: Started 04/11/2020: Initially treated with Xarelto but since he lost insurance she is unable to afford it.  Treatment plan: Start Coumadin with Lovenox. INR checks weekly.

## 2020-06-16 NOTE — ED Notes (Signed)
This Probation officer and RN attempted to get patient on bedside commode. Pt stated she felt as if she needed to have a bowel movement. As patient stood up patient stated she felt like she was about to pass out, so staff got patient back in bed. Pt stated she wanted to lay back down and would try again to use the bathroom after pain and nausea medicine given.

## 2020-06-16 NOTE — Telephone Encounter (Signed)
ML on pt's mobile phone to call and reschedule if needed or to let the office know if she will be late. Her appointment was at 1 pm.

## 2020-06-16 NOTE — ED Provider Notes (Signed)
[facility] DEPT Provider Note   CSN: [id number] Arrival date & time: 06/16/20  1753     History Chief Complaint  Patient presents with  . Nausea  . Abdominal Pain  . Emesis    Sandra Miller is a 44 y.o. female.  HPI Patient presents with a few day history of nausea vomiting abdominal pain.  Has a history of breast and vulvar cancer.  Has been on chemotherapy as recently as 3 weeks ago.  Had been complicated all of her chemotherapy with vomiting.  Is due for new chemotherapy next week.  States has increased pain in her lower abdomen.  Has had nausea vomiting uncontrolled with antiemetics.  Also has had a little bit of diarrhea but that not unusual for her.  The pain is in the lower abdomen.  No fevers.    Past Medical History:  Diagnosis Date  . Bipolar 1 disorder ([place])   . Cancer ([place])   . Complication of anesthesia    wakes up during procedures  . GAD (generalized anxiety disorder)   . Genital HSV    currently per pt  no break out 03-22-2016   . GERD (gastroesophageal reflux disease)   . Hiatal hernia   . History of cervical dysplasia    2012 laser ablation  . History of esophageal dilatation    for dysphasia -- x2 dilated  . History of gastric ulcer   . History of Helicobacter pylori infection    remote hx  . History of hidradenitis suppurativa    "gets all over body intermittantly"    . History of hypertension    no issue since stopped drinking alcohol 2014  . History of kidney stones   . History of panic attacks   . Iron deficiency anemia   . Left ureteral stone   . OCD (obsessive compulsive disorder)   . PTSD (post-traumatic stress disorder)   . Recovering alcoholic in remission ([place])    since 2014  . RLS (restless legs syndrome)   . Smokers' cough ([place])   . Urgency of urination   . Yeast infection involving the vagina and surrounding area    secondary to taking antibiotic    Patient Active Problem List   Diagnosis  Date Noted  . DVT (deep venous thrombosis) ([place]) 06/16/2020  . Acute embolism and thrombosis of right internal jugular vein ([place]) 06/12/2020  . Port-A-Cath in place 03/11/2020  . Malignant neoplasm of upper-inner quadrant of left breast in female, estrogen receptor positive ([place]) 02/25/2020  . Vulvar cancer ([place]) 01/29/2020  . Vulvar ulceration 01/23/2020  . Essential hypertension 10/31/2019  . Nausea and vomiting 12/27/2018  . Seasonal allergic rhinitis 12/27/2018  . History of ELISA positive for HSV 10/01/2018  . Restless leg syndrome 04/01/2016  . Nephrolithiasis 03/08/2016  . Obstructive pyelonephritis 03/08/2016  . Normocytic anemia 03/08/2016  . Healthcare maintenance 12/24/2015  . Hidradenitis suppurativa of left axilla 11/03/2015  . Loss of weight 09/08/2015  . Neck pain on left side 10/22/2014  . Genital warts 05/20/2014  . GERD (gastroesophageal reflux disease) 12/26/2012  . Functional dyspepsia 12/26/2012  . Tobacco abuse 08/22/2012  . H/O: HTN (hypertension) 03/19/2009  . Herpes simplex virus (HSV) infection 02/03/2007  . DISORDER, BIPOLAR NOS 02/03/2007    Past Surgical History:  Procedure Laterality Date  . CESAREAN SECTION  1995   w/  Bilateral Tubal Ligation  . COLONOSCOPY  last one 08-09-2013  . CYSTOSCOPY W/ URETERAL STENT PLACEMENT Left 03/29/2016  Procedure: CYSTOSCOPY WITH STENT REPLACEMENT;  Surgeon: Nickie Retort, MD;  Location: [facility];  Service: Urology;  Laterality: Left;  . CYSTOSCOPY WITH RETROGRADE PYELOGRAM, URETEROSCOPY AND STENT PLACEMENT Left 03/08/2016   Procedure: CYSTOSCOPY WITH  LEFT RETROGRADE PYELOGRAM, AND STENT PLACEMENT;  Surgeon: Nickie Retort, MD;  Location: [facility];  Service: Urology;  Laterality: Left;  . CYSTOSCOPY/RETROGRADE/URETEROSCOPY/STONE EXTRACTION WITH BASKET Left 03/29/2016   Procedure: CYSTOSCOPY/RETROGRADE/URETEROSCOPY/STONE EXTRACTION WITH BASKET;  Surgeon: Nickie Retort, MD;  Location:  [facility];  Service: Urology;  Laterality: Left;  . ENDOMETRIAL ABLATION W/ NOVASURE  04-01-2010  . ESOPHAGOGASTRODUODENOSCOPY  last one 08-09-2013  . KNEE ARTHROSCOPY Left as teen  . LASER ABLATION OF THE CERVIX  2012 approx  . PORTACATH PLACEMENT Right 03/06/2020   Procedure: INSERTION PORT-A-CATH WITH ULTRASOUND GUIDANCE;  Surgeon: Alphonsa Overall, MD;  Location: [facility];  Service: General;  Laterality: Right;  . TRANSTHORACIC ECHOCARDIOGRAM  05-19-2006   lvsf normal, ef 55-65%, there was mild flattening of the interventricular septum during diastoli/  RV size at upper limits normal  . TUBAL LIGATION    . WISDOM TOOTH EXTRACTION  age 65 's     OB History    Gravida  2   Para  2   Term  2   Preterm      AB      Living  2     SAB      TAB      Ectopic      Multiple      Live Births  2           Family History  Problem Relation Age of Onset  . Heart disease Father   . Lung cancer Father        d. 62  . Alcohol abuse Father   . Heart disease Mother   . Depression Mother   . Anxiety disorder Mother   . Drug abuse Brother   . Alcohol abuse Brother   . Drug abuse Brother   . ADD / ADHD Brother   . Colon polyps Brother   . Cancer Paternal Grandfather        "stomach"  . Diabetes Maternal Grandfather   . Diabetes Paternal Grandmother   . Kidney disease Maternal Uncle   . Cirrhosis Cousin        alcoholic  . Anxiety disorder Maternal Aunt   . Depression Maternal Aunt   . Cancer Cousin        maternal; ovarian cancer or other "female" cancer?  . Lung cancer Paternal Uncle 9  . Throat cancer Cousin        paternal; dx 14s  . Lung cancer Cousin        paternal; dx 83s    Social History   Tobacco Use  . Smoking status: Current Every Day Smoker    Packs/day: 0.50    Years: 22.00    Pack years: 11.00    Types: Cigarettes  . Smokeless tobacco: Never Used  . Tobacco comment: Recently started a smoking cessation class.    Vaping Use  . Vaping Use: Never used  Substance Use Topics  . Alcohol use: Not Currently    Alcohol/week: 0.0 standard drinks    Comment:  hx alcohollism  in remission since 2014  . Drug use: No    Comment: hx  marijuana use    Home Medications Prior to Admission medications   Medication Sig Start  Date End Date Taking? Authorizing Provider  acetaminophen (TYLENOL) 500 MG tablet Take 1,000 mg by mouth every 6 (six) hours as needed for moderate pain or fever.    [provider]  diphenoxylate-atropine (LOMOTIL) 2.5-0.025 MG tablet TAKE 1 TO 2 TABLETS BY MOUTH FOUR TIMES DAILY AS NEEDED FOR DIARRHEA 05/26/20   Nicholas Lose, MD  dronabinol (MARINOL) 2.5 MG capsule Take 1 capsule (2.5 mg total) by mouth 2 (two) times daily before a meal. 06/04/20   Tanner, Lyndon Code., PA-C  gabapentin (NEURONTIN) 300 MG capsule Take 1 capsule (300 mg total) by mouth daily. 01/08/20   Gatha Mayer, MD  lidocaine (XYLOCAINE) 5 % ointment Apply 1 application topically 3 (three) times daily as needed. To affected area 04/02/20   Gery Pray, MD  lidocaine-prilocaine (EMLA) cream Apply 1 application topically as needed (to port).    [provider]  lisinopril-hydrochlorothiazide (ZESTORETIC) 20-12.5 MG tablet Take 1 tablet by mouth daily. 10/31/19   Rutherford Guys, MD  LORazepam (ATIVAN) 1 MG tablet Take 1 tablet (1 mg total) by mouth 3 (three) times daily as needed for anxiety. 03/26/20   Arfeen, Arlyce Harman, MD  Multiple Vitamin (MULTIVITAMIN) tablet Take 1 tablet by mouth daily.    [provider]  OLANZapine (ZYPREXA) 15 MG tablet Take 1 tablet (15 mg total) by mouth at bedtime. 03/26/20 03/26/21  Arfeen, Arlyce Harman, MD  omeprazole (PRILOSEC) 40 MG capsule TAKE 1 CAPSULE BY MOUTH TWO TIMES DAILY BEFORE A MEAL 04/23/20   Gatha Mayer, MD  potassium chloride SA (KLOR-CON) 20 MEQ tablet Take 1 tablet (20 mEq total) by mouth 2 (two) times daily. 05/26/20   Nicholas Lose, MD  predniSONE (DELTASONE) 5  MG tablet Take 1 tablet (5 mg total) by mouth in the morning and at bedtime. Take with food 06/04/20   Harle Stanford., PA-C  Probiotic Product (PROBIOTIC DAILY PO) Take 1 tablet by mouth daily.     [provider]  prochlorperazine (COMPAZINE) 10 MG tablet TAKE 1 TABLET BY MOUTH EVERY 6 HOURS AS NEEDED FOR NAUSEA OR VOMITING 06/16/20   Nicholas Lose, MD  promethazine (PHENERGAN) 25 MG tablet Take 1 tablet (25 mg total) by mouth every 6 (six) hours as needed for nausea. 04/28/20   Nicholas Lose, MD  topiramate (TOPAMAX) 25 MG tablet TAKE 1 TABLET BY MOUTH AT BEDTIME Patient taking differently: Take 25 mg by mouth at bedtime.  04/02/20   Arfeen, Arlyce Harman, MD  traZODone (DESYREL) 100 MG tablet Take 1 tablet (100 mg total) by mouth at bedtime. 03/26/20   Arfeen, Arlyce Harman, MD  venlafaxine XR (EFFEXOR XR) 37.5 MG 24 hr capsule Take three capsule (112.5 mg total) by mouth daily. 03/26/20   Arfeen, Arlyce Harman, MD  warfarin (COUMADIN) 5 MG tablet Take 1 tablet (5 mg total) by mouth daily. 06/16/20   Nicholas Lose, MD    Allergies    Depakote [divalproex sodium], Minocycline, and Aspirin  Review of Systems   Review of Systems  Physical Exam Updated Vital Signs BP 123/67 (BP Location: Right Arm)   Pulse 99   Temp 97.7 F (36.5 C) (Oral)   Resp 18   SpO2 100%   Physical Exam  ED Results / Procedures / Treatments   Labs (all labs ordered are listed, but only abnormal results are displayed) Labs Reviewed  CBC WITH DIFFERENTIAL/PLATELET - Abnormal; Notable for the following components:      Result Value   WBC 19.4 (*)  RBC 3.64 (*)    Hemoglobin 11.9 (*)    HCT 35.4 (*)    RDW 15.8 (*)    Neutro Abs 17.1 (*)    Abs Immature Granulocytes 0.27 (*)    All other components within normal limits  COMPREHENSIVE METABOLIC PANEL - Abnormal; Notable for the following components:   Potassium 2.9 (*)    Glucose, Bld 169 (*)    AST 65 (*)    All other components within normal limits  LIPASE, BLOOD   URINALYSIS, ROUTINE W REFLEX MICROSCOPIC  I-STAT BETA HCG BLOOD, ED ([facility], [facility], [facility] ONLY)    EKG None  Radiology CT ABDOMEN PELVIS W CONTRAST  Result Date: 06/16/2020 CLINICAL DATA:  44 year old female with 2 days of nausea, abdominal pain, diarrhea. History of breast and vulvar cancer. EXAM: CT ABDOMEN AND PELVIS WITH CONTRAST TECHNIQUE: Multidetector CT imaging of the abdomen and pelvis was performed using the standard protocol following bolus administration of intravenous contrast. CONTRAST:  14mL OMNIPAQUE IOHEXOL 300 MG/ML  SOLN COMPARISON:  CT Abdomen and Pelvis 03/08/2016 FINDINGS: Lower chest: Cardiac size at the upper limits of normal. No pericardial effusion. Negative lung bases. Hepatobiliary: Negative liver and gallbladder. Pancreas: Negative. Spleen: Negative. Adrenals/Urinary Tract: Normal adrenal glands. Renal enhancement and contrast excretion is symmetric and within normal limits. Normal proximal ureters. There is punctate bilateral nephrolithiasis, including in the left lower pole on coronal image 69. But negative course of both ureters today. Chronic pelvic phleboliths. Unremarkable urinary bladder. Stomach/Bowel: Negative rectum. Decompressed large bowel from the sigmoid colon through the ascending colon. There is some gas at the tip of the cecum. Of the decompressed segments. The descending colon appears most indistinct (coronal image 74). Mild if any associated mesenteric stranding. Appendix remains within normal limits (coronal image 55). Negative terminal ileum. No dilated small bowel. The stomach is distended with fluid but otherwise within normal limits. No free air. No free fluid. Vascular/Lymphatic: Mild Calcified aortic atherosclerosis. Major arterial structures are patent. Portal venous system is patent. No lymphadenopathy. Reproductive: Negative. Other: Subtle asymmetric increased density of the right labia probably related to the stated history of vulvar cancer. Otherwise  negative visible perineum. No pelvic free fluid. Musculoskeletal: Chronic degeneration at the lumbosacral junction. No acute or suspicious osseous lesion identified. IMPRESSION: 1. Appearance suspicious for mild Acute Colitis, most pronounced in the descending colon. No associated free fluid or complicating features. 2. Punctate bilateral nephrolithiasis. No obstructive uropathy. 3. Otherwise negative CT appearance of the abdomen and pelvis. Electronically Signed   By: Genevie Ann M.D.   On: 06/16/2020 22:00    Procedures Procedures (including critical care time)  Medications Ordered in ED Medications  sodium chloride (PF) 0.9 % injection (has no administration in time range)  HYDROmorphone (DILAUDID) injection 0.5 mg (has no administration in time range)  lactated ringers bolus 1,000 mL (0 mLs Intravenous Stopped 06/16/20 2045)  promethazine (PHENERGAN) injection 12.5 mg (12.5 mg Intravenous Given 06/16/20 1954)  HYDROmorphone (DILAUDID) injection 0.5 mg (0.5 mg Intravenous Given 06/16/20 1955)  iohexol (OMNIPAQUE) 300 MG/ML solution 100 mL (100 mLs Intravenous Contrast Given 06/16/20 2136)  promethazine (PHENERGAN) injection 12.5 mg (12.5 mg Intravenous Given 06/16/20 2206)    ED Course  I have reviewed the triage vital signs and the nursing notes.  Pertinent labs & imaging results that were available during my care of the patient were reviewed by me and considered in my medical decision making (see chart for details).    MDM Rules/Calculators/A&P  Patient with nausea vomiting and some diarrhea.  Progressive.  Has had for last couple days with almost no oral intake.  But has had issues with it before that.  They were going to cut back on her chemo because of it.  White count is up.  Also has a hypokalemia.  Still lightheaded and dizzy after nausea meds pain meds and fluids here.  With this and her comorbidities will admit to hospitalist Final Clinical Impression(s) /  ED Diagnoses Final diagnoses:  Colitis  Dehydration    Rx / DC Orders ED Discharge Orders    None       Davonna Belling, MD 06/16/20 2230

## 2020-06-16 NOTE — ED Triage Notes (Signed)
EMS reports pt nauseated 2 days. Some diarrhea, abdominal pain that is progressing. Unable to eat/drink last 2 days. Hx breast and vulva cancer. EMS gave 4mg  zofran. No pain upon palpation. Actively vomiting clear/phlegm.  BP 129 palp HR 118 RR 14 SpO299% RA Temp 97.7 CBG 169  20ga LAC 754mL saline en route

## 2020-06-16 NOTE — Assessment & Plan Note (Signed)
Breast cancer and vulvar cancers  02/20/2020:PET scan on 02/07/20 following a diagnosis of vulvar cancer showed a 1.7cm left breast mass and mildly hypermetabolic left axillary lymph nodes. Mammogram and [place] on 02/18/20 showed a 3.1cm left breast mass at the 11 o'clock position, palpable on exam, and a single abnormal left axillary lymph node with cortical thickening. Left breast biopsy on 02/20/20 showed invasive and in situ carcinoma in the breast and axilla, grade 3, HER-2 positive (3+), ER/PR+ 95%, Ki67 75%  Treatment plan: 1.Neoadjuvant chemoradiation for vulvar cancer with Taxol, carboplatin, Herceptin weekly x6 cycles 2.neoadjuvant chemotherapy with TCH Perjeta x3 cycles 3.Evaluation for breast conserving surgery if possible with sentinel lymph node study 3. Followed by adjuvant radiation therapy if patient had lumpectomy ------------------------------------------------------------------------------------------------------------------------------- Current treatment: Completed 6 cycles ofTaxol carboplatin Herceptin, weeklywith radiation Cycle 2 of TCH Perjeta (plan to give 3 cycles)  Echocardiogram: 02/23/2020: EF 55 to 60%  Chemo toxicities:   Return to clinic in 3 weeks for cycle 3 which will be the last cycle of chemotherapy. After that she will go on Herceptin Perjeta maintenance versus Kadcyla maintenance. She will need a breast MRI and surgical consultations after cycle 3.

## 2020-06-17 ENCOUNTER — Observation Stay (HOSPITAL_COMMUNITY): Payer: Medicare Other

## 2020-06-17 ENCOUNTER — Telehealth: Payer: Self-pay | Admitting: Hematology and Oncology

## 2020-06-17 DIAGNOSIS — F429 Obsessive-compulsive disorder, unspecified: Secondary | ICD-10-CM | POA: Diagnosis present

## 2020-06-17 DIAGNOSIS — C519 Malignant neoplasm of vulva, unspecified: Secondary | ICD-10-CM | POA: Diagnosis present

## 2020-06-17 DIAGNOSIS — F1021 Alcohol dependence, in remission: Secondary | ICD-10-CM | POA: Diagnosis present

## 2020-06-17 DIAGNOSIS — E876 Hypokalemia: Secondary | ICD-10-CM | POA: Diagnosis present

## 2020-06-17 DIAGNOSIS — K219 Gastro-esophageal reflux disease without esophagitis: Secondary | ICD-10-CM | POA: Diagnosis present

## 2020-06-17 DIAGNOSIS — I959 Hypotension, unspecified: Secondary | ICD-10-CM | POA: Diagnosis present

## 2020-06-17 DIAGNOSIS — F17210 Nicotine dependence, cigarettes, uncomplicated: Secondary | ICD-10-CM | POA: Diagnosis present

## 2020-06-17 DIAGNOSIS — K529 Noninfective gastroenteritis and colitis, unspecified: Principal | ICD-10-CM

## 2020-06-17 DIAGNOSIS — G2581 Restless legs syndrome: Secondary | ICD-10-CM | POA: Diagnosis present

## 2020-06-17 DIAGNOSIS — I82C19 Acute embolism and thrombosis of unspecified internal jugular vein: Secondary | ICD-10-CM | POA: Diagnosis present

## 2020-06-17 DIAGNOSIS — Z170 Estrogen receptor positive status [ER+]: Secondary | ICD-10-CM | POA: Diagnosis not present

## 2020-06-17 DIAGNOSIS — Z7952 Long term (current) use of systemic steroids: Secondary | ICD-10-CM | POA: Diagnosis not present

## 2020-06-17 DIAGNOSIS — J410 Simple chronic bronchitis: Secondary | ICD-10-CM | POA: Diagnosis present

## 2020-06-17 DIAGNOSIS — F411 Generalized anxiety disorder: Secondary | ICD-10-CM | POA: Diagnosis present

## 2020-06-17 DIAGNOSIS — Z20822 Contact with and (suspected) exposure to covid-19: Secondary | ICD-10-CM | POA: Diagnosis present

## 2020-06-17 DIAGNOSIS — E860 Dehydration: Secondary | ICD-10-CM | POA: Insufficient documentation

## 2020-06-17 DIAGNOSIS — R112 Nausea with vomiting, unspecified: Secondary | ICD-10-CM | POA: Diagnosis present

## 2020-06-17 DIAGNOSIS — F319 Bipolar disorder, unspecified: Secondary | ICD-10-CM | POA: Diagnosis present

## 2020-06-17 DIAGNOSIS — C50212 Malignant neoplasm of upper-inner quadrant of left female breast: Secondary | ICD-10-CM | POA: Diagnosis present

## 2020-06-17 DIAGNOSIS — F4310 Post-traumatic stress disorder, unspecified: Secondary | ICD-10-CM | POA: Diagnosis present

## 2020-06-17 DIAGNOSIS — Z7901 Long term (current) use of anticoagulants: Secondary | ICD-10-CM | POA: Diagnosis not present

## 2020-06-17 DIAGNOSIS — E861 Hypovolemia: Secondary | ICD-10-CM | POA: Diagnosis present

## 2020-06-17 DIAGNOSIS — I10 Essential (primary) hypertension: Secondary | ICD-10-CM | POA: Diagnosis present

## 2020-06-17 DIAGNOSIS — Z79899 Other long term (current) drug therapy: Secondary | ICD-10-CM | POA: Diagnosis not present

## 2020-06-17 LAB — CBC WITH DIFFERENTIAL/PLATELET
Abs Immature Granulocytes: 0.23 10*3/uL — ABNORMAL HIGH (ref 0.00–0.07)
Abs Immature Granulocytes: 0.19 10*3/uL — ABNORMAL HIGH (ref 0.00–0.07)
Basophils Absolute: 0 10*3/uL (ref 0.0–0.1)
Basophils Absolute: 0 10*3/uL (ref 0.0–0.1)
Basophils Relative: 0 %
Basophils Relative: 0 %
Eosinophils Absolute: 0 10*3/uL (ref 0.0–0.5)
Eosinophils Absolute: 0 10*3/uL (ref 0.0–0.5)
Eosinophils Relative: 0 %
Eosinophils Relative: 0 %
HCT: 30.4 % — ABNORMAL LOW (ref 36.0–46.0)
HCT: 30.4 % — ABNORMAL LOW (ref 36.0–46.0)
Hemoglobin: 10 g/dL — ABNORMAL LOW (ref 12.0–15.0)
Hemoglobin: 10.3 g/dL — ABNORMAL LOW (ref 12.0–15.0)
Immature Granulocytes: 1 %
Immature Granulocytes: 1 %
Lymphocytes Relative: 3 %
Lymphocytes Relative: 2 %
Lymphs Abs: 0.6 10*3/uL — ABNORMAL LOW (ref 0.7–4.0)
Lymphs Abs: 0.5 10*3/uL — ABNORMAL LOW (ref 0.7–4.0)
MCH: 32.6 pg (ref 26.0–34.0)
MCH: 33.3 pg (ref 26.0–34.0)
MCHC: 32.9 g/dL (ref 30.0–36.0)
MCHC: 33.9 g/dL (ref 30.0–36.0)
MCV: 99 fL (ref 80.0–100.0)
MCV: 98.4 fL (ref 80.0–100.0)
Monocytes Absolute: 0.7 10*3/uL (ref 0.1–1.0)
Monocytes Absolute: 1 10*3/uL (ref 0.1–1.0)
Monocytes Relative: 4 %
Monocytes Relative: 4 %
Neutro Abs: 18.8 10*3/uL — ABNORMAL HIGH (ref 1.7–7.7)
Neutro Abs: 21.5 10*3/uL — ABNORMAL HIGH (ref 1.7–7.7)
Neutrophils Relative %: 92 %
Neutrophils Relative %: 93 %
Platelets: 242 10*3/uL (ref 150–400)
Platelets: 251 10*3/uL (ref 150–400)
RBC: 3.07 MIL/uL — ABNORMAL LOW (ref 3.87–5.11)
RBC: 3.09 MIL/uL — ABNORMAL LOW (ref 3.87–5.11)
RDW: 15.9 % — ABNORMAL HIGH (ref 11.5–15.5)
RDW: 15.8 % — ABNORMAL HIGH (ref 11.5–15.5)
WBC: 20.5 10*3/uL — ABNORMAL HIGH (ref 4.0–10.5)
WBC: 23.2 10*3/uL — ABNORMAL HIGH (ref 4.0–10.5)
nRBC: 0 % (ref 0.0–0.2)
nRBC: 0 % (ref 0.0–0.2)

## 2020-06-17 LAB — HEPATIC FUNCTION PANEL
ALT: <5 U/L (ref 0–44)
AST: 132 U/L — ABNORMAL HIGH (ref 15–41)
Albumin: 3.7 g/dL (ref 3.5–5.0)
Alkaline Phosphatase: 38 U/L (ref 38–126)
Bilirubin, Direct: 0.1 mg/dL (ref 0.0–0.2)
Indirect Bilirubin: 0.4 mg/dL (ref 0.3–0.9)
Total Bilirubin: 0.5 mg/dL (ref 0.3–1.2)
Total Protein: 7.2 g/dL (ref 6.5–8.1)

## 2020-06-17 LAB — BASIC METABOLIC PANEL
Anion gap: 15 (ref 5–15)
Anion gap: 10 (ref 5–15)
BUN: 14 mg/dL (ref 6–20)
BUN: 10 mg/dL (ref 6–20)
CO2: 19 mmol/L — ABNORMAL LOW (ref 22–32)
CO2: 22 mmol/L (ref 22–32)
Calcium: 9.1 mg/dL (ref 8.9–10.3)
Calcium: 8.8 mg/dL — ABNORMAL LOW (ref 8.9–10.3)
Chloride: 103 mmol/L (ref 98–111)
Chloride: 107 mmol/L (ref 98–111)
Creatinine, Ser: 0.85 mg/dL (ref 0.44–1.00)
Creatinine, Ser: 0.49 mg/dL (ref 0.44–1.00)
GFR, Estimated: >60 mL/min (ref >60)
GFR, Estimated: >60 mL/min (ref >60)
Glucose, Bld: 223 mg/dL — ABNORMAL HIGH (ref 70–99)
Glucose, Bld: 143 mg/dL — ABNORMAL HIGH (ref 70–99)
Potassium: 2.5 mmol/L — CL (ref 3.5–5.1)
Potassium: 2.6 mmol/L — CL (ref 3.5–5.1)
Sodium: 137 mmol/L (ref 135–145)
Sodium: 139 mmol/L (ref 135–145)

## 2020-06-17 LAB — GASTROINTESTINAL PANEL BY PCR, STOOL (REPLACES STOOL CULTURE)

## 2020-06-17 LAB — CBG MONITORING, ED: Glucose-Capillary: 131 mg/dL — ABNORMAL HIGH (ref 70–99)

## 2020-06-17 LAB — PROTIME-INR
INR: 1.2 (ref 0.8–1.2)
Prothrombin Time: 14.8 seconds (ref 11.4–15.2)

## 2020-06-17 LAB — RESPIRATORY PANEL BY RT PCR (FLU A&B, COVID)
Influenza A by PCR: NEGATIVE
Influenza B by PCR: NEGATIVE
SARS Coronavirus 2 by RT PCR: NEGATIVE

## 2020-06-17 LAB — MAGNESIUM
Magnesium: 0.3 mg/dL — CL (ref 1.7–2.4)
Magnesium: 1.5 mg/dL — ABNORMAL LOW (ref 1.7–2.4)

## 2020-06-17 LAB — LACTIC ACID, PLASMA: Lactic Acid, Venous: 2.5 mmol/L (ref 0.5–1.9)

## 2020-06-17 LAB — C DIFFICILE QUICK SCREEN W PCR REFLEX
C Diff antigen: NEGATIVE
C Diff interpretation: NOT DETECTED
C Diff toxin: NEGATIVE

## 2020-06-17 MED ORDER — ENSURE ENLIVE PO LIQD
237.0000 mL | Freq: Two times a day (BID) | ORAL | Status: DC
  Administered 2020-06-19: 237 mL via ORAL

## 2020-06-17 MED ORDER — LORAZEPAM 1 MG PO TABS
1.0000 mg | ORAL_TABLET | Freq: Three times a day (TID) | ORAL | Status: DC | PRN
  Administered 2020-06-17 – 2020-06-19 (×6): 1 mg via ORAL
  Filled 2020-06-17 (×6): qty 1

## 2020-06-17 MED ORDER — METRONIDAZOLE IN NACL 5-0.79 MG/ML-% IV SOLN
500.0000 mg | Freq: Three times a day (TID) | INTRAVENOUS | Status: DC
  Administered 2020-06-17 – 2020-06-18 (×5): 500 mg via INTRAVENOUS
  Filled 2020-06-17 (×6): qty 100

## 2020-06-17 MED ORDER — TOPIRAMATE 25 MG PO TABS
25.0000 mg | ORAL_TABLET | Freq: Every day | ORAL | Status: DC
  Administered 2020-06-17 – 2020-06-18 (×2): 25 mg via ORAL
  Filled 2020-06-17 (×2): qty 1

## 2020-06-17 MED ORDER — HYDRALAZINE HCL 20 MG/ML IJ SOLN: 10.0000 mg | INTRAMUSCULAR | Status: DC | PRN

## 2020-06-17 MED ORDER — ACETAMINOPHEN 325 MG PO TABS: 650.0000 mg | ORAL_TABLET | Freq: Four times a day (QID) | ORAL | Status: DC | PRN

## 2020-06-17 MED ORDER — POTASSIUM CHLORIDE 10 MEQ/100ML IV SOLN
10.0000 meq | INTRAVENOUS | Status: AC
  Administered 2020-06-17 (×3): 10 meq via INTRAVENOUS
  Filled 2020-06-17 (×3): qty 100

## 2020-06-17 MED ORDER — KCL IN DEXTROSE-NACL 20-5-0.9 MEQ/L-%-% IV SOLN
INTRAVENOUS | Status: AC
  Filled 2020-06-17 (×3): qty 1000

## 2020-06-17 MED ORDER — MAGNESIUM SULFATE 2 GM/50ML IV SOLN
2.0000 g | Freq: Once | INTRAVENOUS | Status: AC
  Administered 2020-06-17: 2 g via INTRAVENOUS
  Filled 2020-06-17: qty 50

## 2020-06-17 MED ORDER — ENOXAPARIN SODIUM 100 MG/ML ~~LOC~~ SOLN
85.0000 mg | Freq: Two times a day (BID) | SUBCUTANEOUS | Status: DC
  Administered 2020-06-17 – 2020-06-18 (×3): 85 mg via SUBCUTANEOUS
  Filled 2020-06-17 (×3): qty 1
  Filled 2020-06-17: qty 0.85

## 2020-06-17 MED ORDER — POTASSIUM CHLORIDE CRYS ER 20 MEQ PO TBCR
40.0000 meq | EXTENDED_RELEASE_TABLET | ORAL | Status: AC
  Administered 2020-06-17 (×2): 40 meq via ORAL
  Filled 2020-06-17 (×2): qty 2

## 2020-06-17 MED ORDER — LISINOPRIL 20 MG PO TABS: 20.0000 mg | ORAL_TABLET | Freq: Every day | ORAL | Status: DC

## 2020-06-17 MED ORDER — ONDANSETRON HCL 4 MG/2ML IJ SOLN
4.0000 mg | Freq: Four times a day (QID) | INTRAMUSCULAR | Status: DC | PRN
  Administered 2020-06-17 (×2): 4 mg via INTRAVENOUS
  Filled 2020-06-17 (×2): qty 2

## 2020-06-17 MED ORDER — CHLORHEXIDINE GLUCONATE CLOTH 2 % EX PADS
6.0000 | MEDICATED_PAD | Freq: Every day | CUTANEOUS | Status: DC
  Administered 2020-06-17: 6 via TOPICAL

## 2020-06-17 MED ORDER — OLANZAPINE 5 MG PO TABS
15.0000 mg | ORAL_TABLET | Freq: Every day | ORAL | Status: DC
  Administered 2020-06-17 – 2020-06-18 (×2): 15 mg via ORAL
  Filled 2020-06-17 (×2): qty 3

## 2020-06-17 MED ORDER — ONDANSETRON HCL 4 MG PO TABS
4.0000 mg | ORAL_TABLET | Freq: Four times a day (QID) | ORAL | Status: DC | PRN
  Administered 2020-06-18: 4 mg via ORAL
  Filled 2020-06-17: qty 1

## 2020-06-17 MED ORDER — LORAZEPAM 2 MG/ML IJ SOLN
0.5000 mg | Freq: Once | INTRAMUSCULAR | Status: AC
  Administered 2020-06-17: 0.5 mg via INTRAMUSCULAR
  Filled 2020-06-17: qty 1

## 2020-06-17 MED ORDER — GABAPENTIN 300 MG PO CAPS
300.0000 mg | ORAL_CAPSULE | Freq: Every day | ORAL | Status: DC
  Administered 2020-06-17 – 2020-06-19 (×3): 300 mg via ORAL
  Filled 2020-06-17 (×3): qty 1

## 2020-06-17 MED ORDER — TRAZODONE HCL 50 MG PO TABS: 100.0000 mg | ORAL_TABLET | Freq: Every day | ORAL | Status: DC

## 2020-06-17 MED ORDER — ADULT MULTIVITAMIN W/MINERALS CH
1.0000 | ORAL_TABLET | Freq: Every day | ORAL | Status: DC
  Administered 2020-06-17 – 2020-06-19 (×3): 1 via ORAL
  Filled 2020-06-17 (×3): qty 1

## 2020-06-17 MED ORDER — SODIUM CHLORIDE 0.9 % IV SOLN
2.0000 g | INTRAVENOUS | Status: DC
  Administered 2020-06-17 – 2020-06-18 (×2): 2 g via INTRAVENOUS
  Filled 2020-06-17 (×2): qty 20
  Filled 2020-06-17: qty 2

## 2020-06-17 MED ORDER — PROMETHAZINE HCL 25 MG/ML IJ SOLN
25.0000 mg | Freq: Once | INTRAMUSCULAR | Status: AC
  Administered 2020-06-17: 25 mg via INTRAVENOUS
  Filled 2020-06-17: qty 1

## 2020-06-17 MED ORDER — POTASSIUM CHLORIDE 10 MEQ/100ML IV SOLN
INTRAVENOUS | Status: AC
  Administered 2020-06-17: 10 meq via INTRAVENOUS
  Filled 2020-06-17: qty 100

## 2020-06-17 MED ORDER — WARFARIN - PHARMACIST DOSING INPATIENT: Freq: Every day | Status: DC

## 2020-06-17 MED ORDER — MAGNESIUM SULFATE 4 GM/100ML IV SOLN
4.0000 g | INTRAVENOUS | Status: AC
  Administered 2020-06-17: 4 g via INTRAVENOUS
  Filled 2020-06-17: qty 100

## 2020-06-17 MED ORDER — ACETAMINOPHEN 650 MG RE SUPP: 650.0000 mg | Freq: Four times a day (QID) | RECTAL | Status: DC | PRN

## 2020-06-17 NOTE — Progress Notes (Signed)
Patient is admitted early this morning with nausea and vomiting, diarrhea, hypokalemia, hypomagnesemia Stool study negative Continue hydration, replace K and mag, advance diet as tolerated

## 2020-06-17 NOTE — ED Notes (Signed)
Walked into patients room and was found on the floor completely nude and had pulled off,HR, BP cuff, Pulse ox, left AC IV and pulled out her port needle. RN asked why patient was on the floor and patient replied " I was hot". Patient did not fall and has no complaints of any injury. Patient assisted back to bed and RN will notified MD

## 2020-06-17 NOTE — ED Notes (Addendum)
Date and time results received: 10/13/211515 (use smartphrase ".now" to insert current time)  Test: potassium  Critical Value: 2.6  Name of Provider Notified: Florencia Reasons, MD  Orders Received? Or Actions Taken?: medication orders given

## 2020-06-17 NOTE — ED Notes (Signed)
Patient placed back on heart monitor, BP, and pulse ox. Will monitor to see if patient will keep on. Currently patient no IV access due to her pulling everything out. Patient is yelling out.

## 2020-06-17 NOTE — ED Notes (Signed)
Patient ambulated to the bedside commode without assistance but patient mentioned "she felt a little dizzy".

## 2020-06-17 NOTE — Progress Notes (Signed)
ANTICOAGULATION CONSULT NOTE - Initial Consult  Pharmacy Consult for Lovenox Indication: Jugular vein thrombus  Allergies  Allergen Reactions  . Depakote [Divalproex Sodium] Nausea And Vomiting  . Minocycline Hives  . Aspirin Hives    States able to tolerate Goody Powders without any problem except for GI upset    Patient Measurements:     Vital Signs: Temp: 98.2 F (36.8 C) (10/13 0159) Temp Source: Oral (10/13 0159) BP: 130/92 (10/13 0409) Pulse Rate: 107 (10/13 0409)  Labs: Recent Labs    06/16/20 1949 06/16/20 2047 06/17/20 0315  HGB 11.9*  --  10.0*  HCT 35.4*  --  30.4*  PLT 317  --  242  LABPROT  --   --  14.8  INR  --   --  1.2  CREATININE  --  0.83 0.85    Estimated Creatinine Clearance: 90.2 mL/min (by C-G formula based on SCr of 0.85 mg/dL).   Medical History: Past Medical History:  Diagnosis Date  . Bipolar 1 disorder ([place])   . Cancer ([place])   . Complication of anesthesia    wakes up during procedures  . GAD (generalized anxiety disorder)   . Genital HSV    currently per pt  no break out 03-22-2016   . GERD (gastroesophageal reflux disease)   . Hiatal hernia   . History of cervical dysplasia    2012 laser ablation  . History of esophageal dilatation    for dysphasia -- x2 dilated  . History of gastric ulcer   . History of Helicobacter pylori infection    remote hx  . History of hidradenitis suppurativa    "gets all over body intermittantly"    . History of hypertension    no issue since stopped drinking alcohol 2014  . History of kidney stones   . History of panic attacks   . Iron deficiency anemia   . Left ureteral stone   . OCD (obsessive compulsive disorder)   . PTSD (post-traumatic stress disorder)   . Recovering alcoholic in remission ([place])    since 2014  . RLS (restless legs syndrome)   . Smokers' cough ([place])   . Urgency of urination   . Yeast infection involving the vagina and surrounding area    secondary to taking  antibiotic    Medications:  PTA on Xarelto 20mg  daily; due to cost plan was to stop Xarelto and change to Lovenox bridge with warfarin at next physician visit  Patient reports last Xarelto dose = 10/12 pm (cannot recall exact time)  Patient had picked up prescription for Warfarin 5mg  po daily but has not yet started taking  Assessment:  44 yr female presents to ED with c/o nausea/vomiting/diarrhea and abdominal pain  PMH significant for breast and vulvar cancer (undergoing chemo), DVT (internal jugular vein thrombus)  Pharmacy originally consulted to dose warfarin; however as patient not currently able to tolerate PO medications, MD has d/c'ed warfarin consult and asked pharmacy to dose Lovenox for VTE treatment  Goal of Therapy:  INR 2-3    Plan:   Lovenox 85mg  sq q12h (start this evening as patient last took Xarelto 20mg  10/12 pm prior to ED arrival)  F/U plans for starting warfarin once patient able to tolerate PO medication  Follow CBC  Sandra Miller, Toribio Harbour, PharmD 06/17/2020,4:21 AM

## 2020-06-17 NOTE — ED Notes (Signed)
Urine specimen contaminated with stool. Unable to send to lab

## 2020-06-17 NOTE — Telephone Encounter (Signed)
No 10/12 los, no changes made to pt schedule   

## 2020-06-17 NOTE — ED Notes (Signed)
Patient has continued throughout shift to move around in bed, removing her gown, heart monitor and BP cuff. This RN has been in multiple times to redress patient and place back on monitor and BP cuff. Explained to patient that she has medications going threw her IV's [facility] accessed right chest and left AC IV) and she will end up pulling one of the lines out. Patient just moans, refuses pain medication and had moved to the end of her bed on her knees. Patient has been vomiting multple times in emesis bags and on floor. Emesis is clear.

## 2020-06-17 NOTE — ED Notes (Signed)
Pt able to tolerate PO water intake and ice chips

## 2020-06-17 NOTE — ED Notes (Signed)
Pt in AOx4 in bed c/o nausea. No emesis evident. Pt does not have an IV access at change of shift

## 2020-06-17 NOTE — ED Notes (Signed)
Pt cleaned, put into fresh gown  and linens changed.

## 2020-06-17 NOTE — H&P (Signed)
History and Physical    Sandra Miller NOM:[id number] DOB: 09-23-1975 DOA: 06/16/2020  PCP: Rutherford Guys, MD  Patient coming from: Home.  Chief Complaint: Nausea vomiting and diarrhea.  HPI: Sandra Miller is a 44 y.o. female with history of breast and vulvar cancer being followed by oncologist last chemotherapy around 3 weeks ago with history of jugular vein DVT just was started on Coumadin was on Xarelto which patient is notable for with history of hypertension and bipolar disorder has been experiencing intractable nausea vomiting diarrhea with abdominal pain for the last 2 days.  Denies any recent sick contact or antibiotic use.  Abdominal pain is mostly in the left lower quadrant.  Denies any fever chills or blood in the vomitus or diarrhea.  ED Course: In the ER patient had repeated episodes of vomiting.  CT abdomen pelvis was showing colitis involving the left lower quadrant.  Patient was started on fluids antibiotics and admitted for further management.  Covid test was negative.  Labs are significant for leukocytosis with hemoglobin around 11.9 potassium of 2.9 blood glucose of 169.  Review of Systems: As per HPI, rest all negative.   Past Medical History:  Diagnosis Date  . Bipolar 1 disorder ([place])   . Cancer ([place])   . Complication of anesthesia    wakes up during procedures  . GAD (generalized anxiety disorder)   . Genital HSV    currently per pt  no break out 03-22-2016   . GERD (gastroesophageal reflux disease)   . Hiatal hernia   . History of cervical dysplasia    2012 laser ablation  . History of esophageal dilatation    for dysphasia -- x2 dilated  . History of gastric ulcer   . History of Helicobacter pylori infection    remote hx  . History of hidradenitis suppurativa    "gets all over body intermittantly"    . History of hypertension    no issue since stopped drinking alcohol 2014  . History of kidney stones   . History of panic attacks   . Iron  deficiency anemia   . Left ureteral stone   . OCD (obsessive compulsive disorder)   . PTSD (post-traumatic stress disorder)   . Recovering alcoholic in remission ([place])    since 2014  . RLS (restless legs syndrome)   . Smokers' cough ([place])   . Urgency of urination   . Yeast infection involving the vagina and surrounding area    secondary to taking antibiotic    Past Surgical History:  Procedure Laterality Date  . CESAREAN SECTION  1995   w/  Bilateral Tubal Ligation  . COLONOSCOPY  last one 08-09-2013  . CYSTOSCOPY W/ URETERAL STENT PLACEMENT Left 03/29/2016   Procedure: CYSTOSCOPY WITH STENT REPLACEMENT;  Surgeon: Nickie Retort, MD;  Location: [facility];  Service: Urology;  Laterality: Left;  . CYSTOSCOPY WITH RETROGRADE PYELOGRAM, URETEROSCOPY AND STENT PLACEMENT Left 03/08/2016   Procedure: CYSTOSCOPY WITH  LEFT RETROGRADE PYELOGRAM, AND STENT PLACEMENT;  Surgeon: Nickie Retort, MD;  Location: [facility];  Service: Urology;  Laterality: Left;  . CYSTOSCOPY/RETROGRADE/URETEROSCOPY/STONE EXTRACTION WITH BASKET Left 03/29/2016   Procedure: CYSTOSCOPY/RETROGRADE/URETEROSCOPY/STONE EXTRACTION WITH BASKET;  Surgeon: Nickie Retort, MD;  Location: [facility];  Service: Urology;  Laterality: Left;  . ENDOMETRIAL ABLATION W/ NOVASURE  04-01-2010  . ESOPHAGOGASTRODUODENOSCOPY  last one 08-09-2013  . KNEE ARTHROSCOPY Left as teen  . LASER ABLATION OF THE CERVIX  2012  approx  . PORTACATH PLACEMENT Right 03/06/2020   Procedure: INSERTION PORT-A-CATH WITH ULTRASOUND GUIDANCE;  Surgeon: Alphonsa Overall, MD;  Location: [place];  Service: General;  Laterality: Right;  . TRANSTHORACIC ECHOCARDIOGRAM  05-19-2006   lvsf normal, ef 55-65%, there was mild flattening of the interventricular septum during diastoli/  RV size at upper limits normal  . TUBAL LIGATION    . WISDOM TOOTH EXTRACTION  age 70 's     reports that she has been smoking  cigarettes. She has a 11.00 pack-year smoking history. She has never used smokeless tobacco. She reports previous alcohol use. She reports that she does not use drugs.  Allergies  Allergen Reactions  . Depakote [Divalproex Sodium] Nausea And Vomiting  . Minocycline Hives  . Aspirin Hives    States able to tolerate Goody Powders without any problem except for GI upset    Family History  Problem Relation Age of Onset  . Heart disease Father   . Lung cancer Father        d. 41  . Alcohol abuse Father   . Heart disease Mother   . Depression Mother   . Anxiety disorder Mother   . Drug abuse Brother   . Alcohol abuse Brother   . Drug abuse Brother   . ADD / ADHD Brother   . Colon polyps Brother   . Cancer Paternal Grandfather        "stomach"  . Diabetes Maternal Grandfather   . Diabetes Paternal Grandmother   . Kidney disease Maternal Uncle   . Cirrhosis Cousin        alcoholic  . Anxiety disorder Maternal Aunt   . Depression Maternal Aunt   . Cancer Cousin        maternal; ovarian cancer or other "female" cancer?  . Lung cancer Paternal Uncle 41  . Throat cancer Cousin        paternal; dx 73s  . Lung cancer Cousin        paternal; dx 57s    Prior to Admission medications   Medication Sig Start Date End Date Taking? Authorizing Provider  acetaminophen (TYLENOL) 500 MG tablet Take 1,000 mg by mouth every 6 (six) hours as needed for moderate pain or fever.    [provider]  diphenoxylate-atropine (LOMOTIL) 2.5-0.025 MG tablet TAKE 1 TO 2 TABLETS BY MOUTH FOUR TIMES DAILY AS NEEDED FOR DIARRHEA 05/26/20   Nicholas Lose, MD  dronabinol (MARINOL) 2.5 MG capsule Take 1 capsule (2.5 mg total) by mouth 2 (two) times daily before a meal. 06/04/20   Tanner, Lyndon Code., PA-C  gabapentin (NEURONTIN) 300 MG capsule Take 1 capsule (300 mg total) by mouth daily. 01/08/20   Gatha Mayer, MD  lidocaine (XYLOCAINE) 5 % ointment Apply 1 application topically 3 (three) times daily as  needed. To affected area 04/02/20   Gery Pray, MD  lidocaine-prilocaine (EMLA) cream Apply 1 application topically as needed (to port).    [provider]  lisinopril-hydrochlorothiazide (ZESTORETIC) 20-12.5 MG tablet Take 1 tablet by mouth daily. 10/31/19   Rutherford Guys, MD  LORazepam (ATIVAN) 1 MG tablet Take 1 tablet (1 mg total) by mouth 3 (three) times daily as needed for anxiety. 03/26/20   Arfeen, Arlyce Harman, MD  Multiple Vitamin (MULTIVITAMIN) tablet Take 1 tablet by mouth daily.    [provider]  OLANZapine (ZYPREXA) 15 MG tablet Take 1 tablet (15 mg total) by mouth at bedtime. 03/26/20 03/26/21  Berniece Andreas  T, MD  omeprazole (PRILOSEC) 40 MG capsule TAKE 1 CAPSULE BY MOUTH TWO TIMES DAILY BEFORE A MEAL 04/23/20   Gatha Mayer, MD  potassium chloride SA (KLOR-CON) 20 MEQ tablet Take 1 tablet (20 mEq total) by mouth 2 (two) times daily. 05/26/20   Nicholas Lose, MD  predniSONE (DELTASONE) 5 MG tablet Take 1 tablet (5 mg total) by mouth in the morning and at bedtime. Take with food 06/04/20   Harle Stanford., PA-C  Probiotic Product (PROBIOTIC DAILY PO) Take 1 tablet by mouth daily.     [provider]  prochlorperazine (COMPAZINE) 10 MG tablet TAKE 1 TABLET BY MOUTH EVERY 6 HOURS AS NEEDED FOR NAUSEA OR VOMITING 06/16/20   Nicholas Lose, MD  promethazine (PHENERGAN) 25 MG tablet Take 1 tablet (25 mg total) by mouth every 6 (six) hours as needed for nausea. 04/28/20   Nicholas Lose, MD  topiramate (TOPAMAX) 25 MG tablet TAKE 1 TABLET BY MOUTH AT BEDTIME Patient taking differently: Take 25 mg by mouth at bedtime.  04/02/20   Arfeen, Arlyce Harman, MD  traZODone (DESYREL) 100 MG tablet Take 1 tablet (100 mg total) by mouth at bedtime. 03/26/20   Arfeen, Arlyce Harman, MD  venlafaxine XR (EFFEXOR XR) 37.5 MG 24 hr capsule Take three capsule (112.5 mg total) by mouth daily. 03/26/20   Arfeen, Arlyce Harman, MD  warfarin (COUMADIN) 5 MG tablet Take 1 tablet (5 mg total) by mouth daily. 06/16/20    Nicholas Lose, MD    Physical Exam: Constitutional: Moderately built and nourished. Vitals:   06/16/20 2300 06/16/20 2330 06/16/20 2342 06/17/20 0000  BP: (!) 153/98 120/90 120/90 127/78  Pulse: 97 93 (!) 101 92  Resp:   18 (!) 24  Temp:      TempSrc:      SpO2: 98% 99% 99% 100%   Eyes: Anicteric no pallor. ENMT: No discharge from the ears eyes nose or mouth. Neck: No mass felt.  No neck rigidity. Respiratory: No rhonchi or crepitations. Cardiovascular: S1-S2 heard. Abdomen: Soft nontender bowel sounds present. Musculoskeletal: No edema. Skin: No rash. Neurologic: Alert awake oriented to time place and person.  Moves all extremities. Psychiatric: Appears normal.  Normal affect.   Labs on Admission: I have personally reviewed following labs and imaging studies  CBC: Recent Labs  Lab 06/16/20 1949  WBC 19.4*  NEUTROABS 17.1*  HGB 11.9*  HCT 35.4*  MCV 97.3  PLT 601   Basic Metabolic Panel: Recent Labs  Lab 06/16/20 2047  NA 138  K 2.9*  CL 102  CO2 23  GLUCOSE 169*  BUN 12  CREATININE 0.83  CALCIUM 9.5   GFR: Estimated Creatinine Clearance: 92.4 mL/min (by C-G formula based on SCr of 0.83 mg/dL). Liver Function Tests: Recent Labs  Lab 06/16/20 2047  AST 65*  ALT 26  ALKPHOS 38  BILITOT 0.9  PROT 7.2  ALBUMIN 3.9   Recent Labs  Lab 06/16/20 2047  LIPASE 32   No results for input(s): AMMONIA in the last 168 hours. Coagulation Profile: No results for input(s): INR, PROTIME in the last 168 hours. Cardiac Enzymes: No results for input(s): CKTOTAL, CKMB, CKMBINDEX, TROPONINI in the last 168 hours. BNP (last 3 results) No results for input(s): PROBNP in the last 8760 hours. HbA1C: No results for input(s): HGBA1C in the last 72 hours. CBG: No results for input(s): GLUCAP in the last 168 hours. Lipid Profile: No results for input(s): CHOL, HDL, LDLCALC, TRIG, CHOLHDL, LDLDIRECT in the  last 72 hours. Thyroid Function Tests: No results for  input(s): TSH, T4TOTAL, FREET4, T3FREE, THYROIDAB in the last 72 hours. Anemia Panel: No results for input(s): VITAMINB12, FOLATE, FERRITIN, TIBC, IRON, RETICCTPCT in the last 72 hours. Urine analysis:    Component Value Date/Time   COLORURINE YELLOW 04/11/2020 [place] 04/11/2020 0834   APPEARANCEUR Clear 07/09/2018 1009   LABSPEC 1.033 (H) 04/11/2020 0834   PHURINE 5.0 04/11/2020 0834   GLUCOSEU NEGATIVE 04/11/2020 0834   HGBUR SMALL (A) 04/11/2020 0834   BILIRUBINUR NEGATIVE 04/11/2020 0834   BILIRUBINUR Negative 07/09/2018 1009   KETONESUR NEGATIVE 04/11/2020 0834   PROTEINUR 30 (A) 04/11/2020 0834   UROBILINOGEN 0.2 12/27/2017 1856   NITRITE NEGATIVE 04/11/2020 0834   LEUKOCYTESUR MODERATE (A) 04/11/2020 0834   Sepsis Labs: @LABRCNTIP (procalcitonin:4,lacticidven:4) )No results found for this or any previous visit (from the past 240 hour(s)).   Radiological Exams on Admission: CT ABDOMEN PELVIS W CONTRAST  Result Date: 06/16/2020 CLINICAL DATA:  44 year old female with 2 days of nausea, abdominal pain, diarrhea. History of breast and vulvar cancer. EXAM: CT ABDOMEN AND PELVIS WITH CONTRAST TECHNIQUE: Multidetector CT imaging of the abdomen and pelvis was performed using the standard protocol following bolus administration of intravenous contrast. CONTRAST:  136mL OMNIPAQUE IOHEXOL 300 MG/ML  SOLN COMPARISON:  CT Abdomen and Pelvis 03/08/2016 FINDINGS: Lower chest: Cardiac size at the upper limits of normal. No pericardial effusion. Negative lung bases. Hepatobiliary: Negative liver and gallbladder. Pancreas: Negative. Spleen: Negative. Adrenals/Urinary Tract: Normal adrenal glands. Renal enhancement and contrast excretion is symmetric and within normal limits. Normal proximal ureters. There is punctate bilateral nephrolithiasis, including in the left lower pole on coronal image 69. But negative course of both ureters today. Chronic pelvic phleboliths. Unremarkable  urinary bladder. Stomach/Bowel: Negative rectum. Decompressed large bowel from the sigmoid colon through the ascending colon. There is some gas at the tip of the cecum. Of the decompressed segments. The descending colon appears most indistinct (coronal image 74). Mild if any associated mesenteric stranding. Appendix remains within normal limits (coronal image 55). Negative terminal ileum. No dilated small bowel. The stomach is distended with fluid but otherwise within normal limits. No free air. No free fluid. Vascular/Lymphatic: Mild Calcified aortic atherosclerosis. Major arterial structures are patent. Portal venous system is patent. No lymphadenopathy. Reproductive: Negative. Other: Subtle asymmetric increased density of the right labia probably related to the stated history of vulvar cancer. Otherwise negative visible perineum. No pelvic free fluid. Musculoskeletal: Chronic degeneration at the lumbosacral junction. No acute or suspicious osseous lesion identified. IMPRESSION: 1. Appearance suspicious for mild Acute Colitis, most pronounced in the descending colon. No associated free fluid or complicating features. 2. Punctate bilateral nephrolithiasis. No obstructive uropathy. 3. Otherwise negative CT appearance of the abdomen and pelvis. Electronically Signed   By: Genevie Ann M.D.   On: 06/16/2020 22:00     Assessment/Plan Principal Problem:   Colitis Active Problems:   Essential hypertension   Vulvar cancer ([place])   Malignant neoplasm of upper-inner quadrant of left breast in female, estrogen receptor positive (H[facility])   DVT (deep venous thrombosis) ([facility])   Hypokalemia    1. Intractable nausea vomiting with abdominal pain and diarrhea with CT scan showing colitis likely cause for which patient has been placed on empiric antibiotics.  Check lactate since patient still colitis is mostly on the descending colon side.  If patient has further episodes of diarrhea will check stool studies.  Since patient is  having persistent vomiting  the ER we will keep patient n.p.o. for now. 2. Jugular vein DVT for which patient was just started on Coumadin and since patient is not able to tolerate anything orally I have advised pharmacy dose Lovenox.  When patient can take oral start Coumadin. 3. Hypertension we will keep patient on as needed IV hydralazine for now. 4. Severe hypokalemia likely from vomiting and diarrhea.  Replace and recheck check magnesium. 5. Leukocytosis could be from #1.  Could also be from recent use of steroids.  Patient is on antibiotic for colitis. 6. History of bipolar disorder and Zyprexa Effexor and trazodone.   DVT prophylaxis: Lovenox full dose. Code Status: Full code. Family Communication: Discussed with patient. Disposition Plan: Home. Consults called: None. Admission status: Observation.   Rise Patience MD [facility] Pager [phone].  If 7PM-7AM, please contact night-coverage [email or web address] Password [facility]  06/17/2020, 12:35 AM

## 2020-06-17 NOTE — ED Notes (Signed)
Patient is currently resting. Will reassess vital signs when patient is awake.

## 2020-06-17 NOTE — ED Notes (Signed)
Report called to RN [facility]

## 2020-06-17 NOTE — ED Notes (Signed)
Date and time results received: 06/17/20 0930 (use smartphrase ".now" to insert current time)  Test: magnesium and lactic  Critical Value: mag- 0.3, lactic 2.5  Name of Provider Notified: Florencia Reasons, MD  Orders Received? Or Actions Taken?: No orders given at this time

## 2020-06-17 NOTE — Evaluation (Signed)
SLP Cancellation Note  Patient Details Name: Sandra Miller MRN: [id number] DOB: 09-Mar-1976   Cancelled treatment:       Reason Eval/Treat Not Completed: Other (comment) (pt had been in ED all day and having nausea/vomiting, will see pt next date)  Kathleen Lime, [facility] [place] Office [phone] Pager [phone]   Macario Golds 06/17/2020, 8:12 PM

## 2020-06-17 NOTE — ED Notes (Signed)
Port accessed and IV meds restarted

## 2020-06-17 NOTE — ED Notes (Signed)
Patient stated she had to use the bathroom. Attempted to get patient up to [facility]. Patient immediately stated that she needed to lie back down

## 2020-06-17 NOTE — ED Notes (Signed)
Date and time results received: 06/17/20 1512 (use smartphrase ".now" to insert current time)  Test: K Critical Value: 2.6  Name of Provider Notified: [name] Butts  Orders Received? Or Actions Taken?:

## 2020-06-17 NOTE — ED Notes (Signed)
Pt placed on purewick 

## 2020-06-18 ENCOUNTER — Other Ambulatory Visit: Payer: Self-pay

## 2020-06-18 ENCOUNTER — Encounter: Payer: Self-pay | Admitting: *Deleted

## 2020-06-18 ENCOUNTER — Telehealth: Payer: Self-pay | Admitting: *Deleted

## 2020-06-18 ENCOUNTER — Other Ambulatory Visit: Payer: Self-pay | Admitting: *Deleted

## 2020-06-18 DIAGNOSIS — Z170 Estrogen receptor positive status [ER+]: Secondary | ICD-10-CM

## 2020-06-18 DIAGNOSIS — C50212 Malignant neoplasm of upper-inner quadrant of left female breast: Secondary | ICD-10-CM

## 2020-06-18 LAB — COMPREHENSIVE METABOLIC PANEL
ALT: 193 U/L — ABNORMAL HIGH (ref 0–44)
AST: 904 U/L — ABNORMAL HIGH (ref 15–41)
Albumin: 3.5 g/dL (ref 3.5–5.0)
Alkaline Phosphatase: 34 U/L — ABNORMAL LOW (ref 38–126)
Anion gap: 8 (ref 5–15)
BUN: 6 mg/dL (ref 6–20)
CO2: 22 mmol/L (ref 22–32)
Calcium: 8.6 mg/dL — ABNORMAL LOW (ref 8.9–10.3)
Chloride: 111 mmol/L (ref 98–111)
Creatinine, Ser: 0.66 mg/dL (ref 0.44–1.00)
GFR, Estimated: >60 mL/min (ref >60)
Glucose, Bld: 118 mg/dL — ABNORMAL HIGH (ref 70–99)
Potassium: 3.6 mmol/L (ref 3.5–5.1)
Sodium: 141 mmol/L (ref 135–145)
Total Bilirubin: 0.5 mg/dL (ref 0.3–1.2)
Total Protein: 6.4 g/dL — ABNORMAL LOW (ref 6.5–8.1)

## 2020-06-18 LAB — LACTIC ACID, PLASMA: Lactic Acid, Venous: 1.7 mmol/L (ref 0.5–1.9)

## 2020-06-18 LAB — MAGNESIUM: Magnesium: 1.5 mg/dL — ABNORMAL LOW (ref 1.7–2.4)

## 2020-06-18 MED ORDER — DIPHENOXYLATE-ATROPINE 2.5-0.025 MG PO TABS: 1.0000 | ORAL_TABLET | Freq: Four times a day (QID) | ORAL | Status: DC | PRN

## 2020-06-18 MED ORDER — PROMETHAZINE HCL 25 MG PO TABS: 25.0000 mg | ORAL_TABLET | Freq: Four times a day (QID) | ORAL | Status: DC | PRN

## 2020-06-18 MED ORDER — LIP MEDEX EX OINT
TOPICAL_OINTMENT | CUTANEOUS | Status: AC
  Filled 2020-06-18: qty 7

## 2020-06-18 MED ORDER — NICOTINE 21 MG/24HR TD PT24
21.0000 mg | MEDICATED_PATCH | Freq: Every day | TRANSDERMAL | Status: DC
  Administered 2020-06-18 – 2020-06-19 (×2): 21 mg via TRANSDERMAL
  Filled 2020-06-18 (×2): qty 1

## 2020-06-18 MED ORDER — METHYLPREDNISOLONE SODIUM SUCC 125 MG IJ SOLR
60.0000 mg | Freq: Once | INTRAMUSCULAR | Status: AC
  Administered 2020-06-18: 60 mg via INTRAVENOUS
  Filled 2020-06-18: qty 2

## 2020-06-18 MED ORDER — MAGNESIUM SULFATE 4 GM/100ML IV SOLN
4.0000 g | Freq: Once | INTRAVENOUS | Status: AC
  Administered 2020-06-18: 4 g via INTRAVENOUS
  Filled 2020-06-18: qty 100

## 2020-06-18 MED ORDER — POTASSIUM CHLORIDE CRYS ER 20 MEQ PO TBCR
40.0000 meq | EXTENDED_RELEASE_TABLET | Freq: Once | ORAL | Status: AC
  Administered 2020-06-18: 40 meq via ORAL
  Filled 2020-06-18: qty 2

## 2020-06-18 MED ORDER — VENLAFAXINE HCL ER 75 MG PO CP24
112.5000 mg | ORAL_CAPSULE | Freq: Every day | ORAL | Status: DC
  Administered 2020-06-19: 112.5 mg via ORAL
  Filled 2020-06-18: qty 1

## 2020-06-18 MED ORDER — PANTOPRAZOLE SODIUM 40 MG PO TBEC
40.0000 mg | DELAYED_RELEASE_TABLET | Freq: Two times a day (BID) | ORAL | Status: DC
  Administered 2020-06-18 – 2020-06-19 (×2): 40 mg via ORAL
  Filled 2020-06-18 (×2): qty 1

## 2020-06-18 NOTE — Progress Notes (Signed)
PROGRESS NOTE    KENZLIE DISCH  NFA:[id number] DOB: 04/25/1976 DOA: 06/16/2020 PCP: Rutherford Guys, MD    Chief Complaint  Patient presents with  . Nausea  . Abdominal Pain  . Emesis    Brief Narrative:  Chief Complaint: Nausea vomiting and diarrhea.  HPI: Sandra Miller is a 44 y.o. female with history of breast and vulvar cancer being followed by oncologist last chemotherapy around 3 weeks ago with history of jugular vein DVT just was started on Coumadin was on Xarelto which patient is notable for with history of hypertension and bipolar disorder has been experiencing intractable nausea vomiting diarrhea with abdominal pain for the last 2 days.  Denies any recent sick contact or antibiotic use.  Abdominal pain is mostly in the left lower quadrant.  Denies any fever chills or blood in the vomitus or diarrhea.  ED Course: In the ER patient had repeated episodes of vomiting.  CT abdomen pelvis was showing colitis involving the left lower quadrant.  Patient was started on fluids antibiotics and admitted for further management.  Covid test was negative.  Labs are significant for leukocytosis with hemoglobin around 11.9 potassium of 2.9 blood glucose of 169.   Subjective:   no vomiting, denies pain, she wants diet advanced ,  LFT elevated on the expected Patient is very anxious   Assessment & Plan:   Principal Problem:   Colitis Active Problems:   Essential hypertension   Vulvar cancer ([facility])   Malignant neoplasm of upper-inner quadrant of left breast in female, estrogen receptor positive ([place])   DVT (deep venous thrombosis) ([facility])   Hypokalemia   N&V (nausea and vomiting)   Nausea vomiting diarrhea -C. difficile negative, GI PCR panel negative -She was initially treated as colitis with Rocephin and Flagyl, case discussed with oncology Dr. Lindi Adie will think symptoms likely delayed reaction to patient cancer treatment, can hold of antibiotics and observe -Symptom  improved, advance diet as tolerated, continue supportive care  Severe hypokalemia and hypomagnesemia -Replace K and mag aggressively, hold HCTZ  Leukocytosis -Per Dr. Lindi Adie patient received G-CSF -Hold off antibiotic Monitor  Elevation of LFT -Initial CT abdomen did not show any hepatobiliary abnormality -Case discussed with oncology Dr. Lindi Adie who think may be drug reaction versus auto immune -Recommend DC Rocephin and Flagyl, empirically start steroid treatment -Repeat LFT in the morning  Jugular vein DVT -Due to not able to take oral consistently, she is started on therapeutic Lovenox here -Plan to discharge on Xarelto (can get a month free supply), then transition to Coumadin which is more affordable, case discussed with Dr. Lindi Adie who agrees  Hypertension, present with hypovolemia hypotension Received IV hydration, normotensive today, continue hold blood pressure medication  History of bipolar disorder and Zyprexa Effexor and trazodone  Body mass index is 30.17 kg/m.   DVT prophylaxis: On therapeutic Lovenox   Code Status: Full Family Communication: Patient Disposition:   Status is: Inpatient  Dispo: The patient is from: Home              Anticipated d/c is to: Home              Anticipated d/c date is: Likely tomorrow if LFT trending down              Patient currently not medically stable to discharge  Consultants:   Phone conversation with oncology Dr. Lindi Adie  Procedures:   None  Antimicrobials:   Rocephin and Flagyl     Objective:  Vitals:   06/17/20 2047 06/17/20 2300 06/18/20 0300 06/18/20 0611  BP: 125/87 121/86 124/88 129/77  Pulse: 96 88 84 (!) 102  Resp: 16   16  Temp: 98.2 F (36.8 C) 98.6 F (37 C) 98.2 F (36.8 C) 98.4 F (36.9 C)  TempSrc: Oral Oral Oral Oral  SpO2: 100% 100% 100% 99%  Weight:   83.5 kg   Height:   5' 5.5" (1.664 m)     Intake/Output Summary (Last 24 hours) at 06/18/2020 1053 Last data filed at 06/18/2020  0600 Gross per 24 hour  Intake 1905.27 ml  Output 400 ml  Net 1505.27 ml   Filed Weights   06/17/20 1536 06/18/20 0300  Weight: 83.5 kg 83.5 kg    Examination:  General exam: Anxious, in tears at times Respiratory system: Clear to auscultation. Respiratory effort normal. Cardiovascular system: S1 & S2 heard, RRR. No JVD, no murmur, No pedal edema. Gastrointestinal system: Abdomen is nondistended, soft and nontender. No organomegaly or masses felt. Normal bowel sounds heard. Central nervous system: Alert and oriented. No focal neurological deficits. Extremities: Symmetric 5 x 5 power. Skin: No rashes, lesions or ulcers Psychiatry: Anxious, labile mood    Data Reviewed: I have personally reviewed following labs and imaging studies  CBC: Recent Labs  Lab 06/16/20 1949 06/17/20 0315 06/17/20 0900  WBC 19.4* 20.5* 23.2*  NEUTROABS 17.1* 18.8* 21.5*  HGB 11.9* 10.0* 10.3*  HCT 35.4* 30.4* 30.4*  MCV 97.3 99.0 98.4  PLT 317 242 229    Basic Metabolic Panel: Recent Labs  Lab 06/16/20 2047 06/17/20 0315 06/17/20 0900 06/17/20 1430 06/18/20 0600  NA 138 137  --  139 141  K 2.9* 2.5*  --  2.6* 3.6  CL 102 103  --  107 111  CO2 23 19*  --  22 22  GLUCOSE 169* 223*  --  143* 118*  BUN 12 14  --  10 6  CREATININE 0.83 0.85  --  0.49 0.66  CALCIUM 9.5 9.1  --  8.8* 8.6*  MG  --   --  0.3* 1.5* 1.5*    GFR: Estimated Creatinine Clearance: 94.7 mL/min (by C-G formula based on SCr of 0.66 mg/dL).  Liver Function Tests: Recent Labs  Lab 06/16/20 2047 06/17/20 0315 06/18/20 0600  AST 65* 132* 904*  ALT 26 <5 193*  ALKPHOS 38 38 34*  BILITOT 0.9 0.5 0.5  PROT 7.2 7.2 6.4*  ALBUMIN 3.9 3.7 3.5    CBG: Recent Labs  Lab 06/17/20 1147  GLUCAP 131*     Recent Results (from the past 240 hour(s))  Respiratory Panel by RT PCR (Flu A&B, Covid) - Nasopharyngeal Swab     Status: None   Collection Time: 06/16/20 10:38 PM   Specimen: Nasopharyngeal Swab  Result  Value Ref Range Status   SARS Coronavirus 2 by RT PCR NEGATIVE NEGATIVE Final    Comment: (NOTE) SARS-CoV-2 target nucleic acids are NOT DETECTED.  The SARS-CoV-2 RNA is generally detectable in upper respiratoy specimens during the acute phase of infection. The lowest concentration of SARS-CoV-2 viral copies this assay can detect is 131 copies/mL. A negative result does not preclude SARS-Cov-2 infection and should not be used as the sole basis for treatment or other patient management decisions. A negative result may occur with  improper specimen collection/handling, submission of specimen other than nasopharyngeal swab, presence of viral mutation(s) within the areas targeted by this assay, and inadequate number of viral copies (<131 copies/mL). A  negative result must be combined with clinical observations, patient history, and epidemiological information. The expected result is Negative.  Fact Sheet for Patients:  PinkCheek.be  Fact Sheet for Healthcare Providers:  GravelBags.it  This test is no t yet approved or cleared by the [place] FDA and  has been authorized for detection and/or diagnosis of SARS-CoV-2 by FDA under an Emergency Use Authorization (EUA). This EUA will remain  in effect (meaning this test can be used) for the duration of the COVID-19 declaration under Section 564(b)(1) of the Act, 21 U.S.C. section 360bbb-3(b)(1), unless the authorization is terminated or revoked sooner.     Influenza A by PCR NEGATIVE NEGATIVE Final   Influenza B by PCR NEGATIVE NEGATIVE Final    Comment: (NOTE) The Xpert Xpress SARS-CoV-2/FLU/RSV assay is intended as an aid in  the diagnosis of influenza from Nasopharyngeal swab specimens and  should not be used as a sole basis for treatment. Nasal washings and  aspirates are unacceptable for Xpert Xpress SARS-CoV-2/FLU/RSV  testing.  Fact Sheet for  Patients: PinkCheek.be  Fact Sheet for Healthcare Providers: GravelBags.it  This test is not yet approved or cleared by the [place] FDA and  has been authorized for detection and/or diagnosis of SARS-CoV-2 by  FDA under an Emergency Use Authorization (EUA). This EUA will remain  in effect (meaning this test can be used) for the duration of the  Covid-19 declaration under Section 564(b)(1) of the Act, 21  U.S.C. section 360bbb-3(b)(1), unless the authorization is  terminated or revoked. Performed at [facility], [place] [address], [place], [place] [address]   Gastrointestinal Panel by PCR , Stool     Status: None   Collection Time: 06/17/20  9:00 AM   Specimen: STOOL  Result Value Ref Range Status   Campylobacter species NOT DETECTED NOT DETECTED Final   Plesimonas shigelloides NOT DETECTED NOT DETECTED Final   Salmonella species NOT DETECTED NOT DETECTED Final   Yersinia enterocolitica NOT DETECTED NOT DETECTED Final   Vibrio species NOT DETECTED NOT DETECTED Final   Vibrio cholerae NOT DETECTED NOT DETECTED Final   Enteroaggregative E coli (EAEC) NOT DETECTED NOT DETECTED Final   Enteropathogenic E coli (EPEC) NOT DETECTED NOT DETECTED Final   Enterotoxigenic E coli (ETEC) NOT DETECTED NOT DETECTED Final   Shiga like toxin producing E coli (STEC) NOT DETECTED NOT DETECTED Final   Shigella/Enteroinvasive E coli (EIEC) NOT DETECTED NOT DETECTED Final   Cryptosporidium NOT DETECTED NOT DETECTED Final   Cyclospora cayetanensis NOT DETECTED NOT DETECTED Final   Entamoeba histolytica NOT DETECTED NOT DETECTED Final   Giardia lamblia NOT DETECTED NOT DETECTED Final   Adenovirus F40/41 NOT DETECTED NOT DETECTED Final   Astrovirus NOT DETECTED NOT DETECTED Final   Norovirus GI/GII NOT DETECTED NOT DETECTED Final   Rotavirus A NOT DETECTED NOT DETECTED Final   Sapovirus (I, II, IV, and V) NOT DETECTED NOT  DETECTED Final    Comment: Performed at [facility], [place] [place], [place] [address]  C Difficile Quick Screen w PCR reflex     Status: None   Collection Time: 06/17/20  9:00 AM   Specimen: STOOL  Result Value Ref Range Status   C Diff antigen NEGATIVE NEGATIVE Final   C Diff toxin NEGATIVE NEGATIVE Final   C Diff interpretation No C. difficile detected.  Final    Comment: Performed at [facility], [place] [address], [place], [place] [address]  Radiology Studies: CT HEAD WO CONTRAST  Result Date: 06/17/2020 CLINICAL DATA:  Mental status changes. EXAM: CT HEAD WITHOUT CONTRAST TECHNIQUE: Contiguous axial images were obtained from the base of the skull through the vertex without intravenous contrast. COMPARISON:  01/01/2012 FINDINGS: Brain: There is no evidence for acute hemorrhage, hydrocephalus, mass lesion, or abnormal extra-axial fluid collection. No definite CT evidence for acute infarction. Vascular: No hyperdense vessel or unexpected calcification. Skull: No evidence for fracture. No worrisome lytic or sclerotic lesion. Sinuses/Orbits: The visualized paranasal sinuses and mastoid air cells are clear. Visualized portions of the globes and intraorbital fat are unremarkable. Other: None. IMPRESSION: Unremarkable exam.  No acute intracranial abnormality. Electronically Signed   By: Misty Stanley M.D.   On: 06/17/2020 06:49   CT ABDOMEN PELVIS W CONTRAST  Result Date: 06/16/2020 CLINICAL DATA:  44 year old female with 2 days of nausea, abdominal pain, diarrhea. History of breast and vulvar cancer. EXAM: CT ABDOMEN AND PELVIS WITH CONTRAST TECHNIQUE: Multidetector CT imaging of the abdomen and pelvis was performed using the standard protocol following bolus administration of intravenous contrast. CONTRAST:  155mL OMNIPAQUE IOHEXOL 300 MG/ML  SOLN COMPARISON:  CT Abdomen and Pelvis 03/08/2016 FINDINGS: Lower chest: Cardiac size at the upper limits  of normal. No pericardial effusion. Negative lung bases. Hepatobiliary: Negative liver and gallbladder. Pancreas: Negative. Spleen: Negative. Adrenals/Urinary Tract: Normal adrenal glands. Renal enhancement and contrast excretion is symmetric and within normal limits. Normal proximal ureters. There is punctate bilateral nephrolithiasis, including in the left lower pole on coronal image 69. But negative course of both ureters today. Chronic pelvic phleboliths. Unremarkable urinary bladder. Stomach/Bowel: Negative rectum. Decompressed large bowel from the sigmoid colon through the ascending colon. There is some gas at the tip of the cecum. Of the decompressed segments. The descending colon appears most indistinct (coronal image 74). Mild if any associated mesenteric stranding. Appendix remains within normal limits (coronal image 55). Negative terminal ileum. No dilated small bowel. The stomach is distended with fluid but otherwise within normal limits. No free air. No free fluid. Vascular/Lymphatic: Mild Calcified aortic atherosclerosis. Major arterial structures are patent. Portal venous system is patent. No lymphadenopathy. Reproductive: Negative. Other: Subtle asymmetric increased density of the right labia probably related to the stated history of vulvar cancer. Otherwise negative visible perineum. No pelvic free fluid. Musculoskeletal: Chronic degeneration at the lumbosacral junction. No acute or suspicious osseous lesion identified. IMPRESSION: 1. Appearance suspicious for mild Acute Colitis, most pronounced in the descending colon. No associated free fluid or complicating features. 2. Punctate bilateral nephrolithiasis. No obstructive uropathy. 3. Otherwise negative CT appearance of the abdomen and pelvis. Electronically Signed   By: Genevie Ann M.D.   On: 06/16/2020 22:00        Scheduled Meds: . Chlorhexidine Gluconate Cloth  6 each Topical Daily  . enoxaparin (LOVENOX) injection  85 mg Subcutaneous Q12H   . feeding supplement  237 mL Oral BID BM  . gabapentin  300 mg Oral Daily  . multivitamin with minerals  1 tablet Oral Daily  . OLANZapine  15 mg Oral QHS  . topiramate  25 mg Oral QHS  . traZODone  100 mg Oral QHS   Continuous Infusions: . magnesium sulfate bolus IVPB 4 g (06/18/20 1051)     LOS: 1 day   Time spent: 39mins Greater than 50% of this time was spent in counseling, explanation of diagnosis, planning of further management, and coordination of care.  I have personally reviewed and interpreted on  06/18/2020  daily labs, tele strips, I  reviewed all nursing notes, pharmacy notes,  vitals, pertinent old records  I have discussed plan of care as described above with RN , patient on 06/18/2020  Voice Recognition /Dragon dictation system was used to create this note, attempts have been made to correct errors. Please contact the author with questions and/or clarifications.   Florencia Reasons, MD PhD FACP [facility]  Available via Epic secure chat 7am-7pm for nonurgent issues Please page for urgent issues To page the attending provider between 7A-7P or the covering provider during after hours 7P-7A, please log into the web site [email or web address] and access using universal [place] password for that web site. If you do not have the password, please call the hospital operator.    06/18/2020, 10:53 AM

## 2020-06-18 NOTE — Progress Notes (Signed)
Per MD pt to stop chemotherapy treatments for the future, obtain a breast MRI and move forward to surgery. Dawn, nurse navigator notified and verbalized understanding stating she will schedule scans and work with surgery.  MD also advised that pt to start taking Warfarin 5 mg daily once discharged from the hospital.  Pt will f/u next week in office with labs and MD visit to monitor INR.  Message sent to scheduling.

## 2020-06-18 NOTE — Telephone Encounter (Signed)
Received call from pt stating she has been hospitalized for colitis.  Pt states she may be discharged home today if she can tolerate a soft food diet.  Pt states she has not started Xarelto or warfarin.  Pt states she is currently on Lovenox in the hospital and is concerned with the process of starting Xarelto and Warfarin.  RN will discuss with MD to contact pt and provide further instructions.

## 2020-06-19 ENCOUNTER — Ambulatory Visit: Payer: No Typology Code available for payment source | Admitting: Hematology and Oncology

## 2020-06-19 ENCOUNTER — Other Ambulatory Visit: Payer: Self-pay | Admitting: *Deleted

## 2020-06-19 ENCOUNTER — Ambulatory Visit: Payer: No Typology Code available for payment source

## 2020-06-19 ENCOUNTER — Other Ambulatory Visit: Payer: No Typology Code available for payment source

## 2020-06-19 DIAGNOSIS — Z170 Estrogen receptor positive status [ER+]: Secondary | ICD-10-CM

## 2020-06-19 DIAGNOSIS — C50212 Malignant neoplasm of upper-inner quadrant of left female breast: Secondary | ICD-10-CM

## 2020-06-19 LAB — COMPREHENSIVE METABOLIC PANEL
ALT: 181 U/L — ABNORMAL HIGH (ref 0–44)
AST: 546 U/L — ABNORMAL HIGH (ref 15–41)
Albumin: 3.6 g/dL (ref 3.5–5.0)
Alkaline Phosphatase: 36 U/L — ABNORMAL LOW (ref 38–126)
Anion gap: 8 (ref 5–15)
BUN: 10 mg/dL (ref 6–20)
CO2: 22 mmol/L (ref 22–32)
Calcium: 9 mg/dL (ref 8.9–10.3)
Chloride: 110 mmol/L (ref 98–111)
Creatinine, Ser: 0.55 mg/dL (ref 0.44–1.00)
GFR, Estimated: >60 mL/min (ref >60)
Glucose, Bld: 105 mg/dL — ABNORMAL HIGH (ref 70–99)
Potassium: 4.3 mmol/L (ref 3.5–5.1)
Sodium: 140 mmol/L (ref 135–145)
Total Bilirubin: 0.4 mg/dL (ref 0.3–1.2)
Total Protein: 6.8 g/dL (ref 6.5–8.1)

## 2020-06-19 LAB — CBC WITH DIFFERENTIAL/PLATELET
Abs Immature Granulocytes: 0.1 10*3/uL — ABNORMAL HIGH (ref 0.00–0.07)
Basophils Absolute: 0 10*3/uL (ref 0.0–0.1)
Basophils Relative: 0 %
Eosinophils Absolute: 0 10*3/uL (ref 0.0–0.5)
Eosinophils Relative: 0 %
HCT: 28.5 % — ABNORMAL LOW (ref 36.0–46.0)
Hemoglobin: 9.1 g/dL — ABNORMAL LOW (ref 12.0–15.0)
Immature Granulocytes: 1 %
Lymphocytes Relative: 7 %
Lymphs Abs: 0.8 10*3/uL (ref 0.7–4.0)
MCH: 32.6 pg (ref 26.0–34.0)
MCHC: 31.9 g/dL (ref 30.0–36.0)
MCV: 102.2 fL — ABNORMAL HIGH (ref 80.0–100.0)
Monocytes Absolute: 0.5 10*3/uL (ref 0.1–1.0)
Monocytes Relative: 5 %
Neutro Abs: 9.2 10*3/uL — ABNORMAL HIGH (ref 1.7–7.7)
Neutrophils Relative %: 87 %
Platelets: 198 10*3/uL (ref 150–400)
RBC: 2.79 MIL/uL — ABNORMAL LOW (ref 3.87–5.11)
RDW: 15.9 % — ABNORMAL HIGH (ref 11.5–15.5)
WBC: 10.6 10*3/uL — ABNORMAL HIGH (ref 4.0–10.5)
nRBC: 0 % (ref 0.0–0.2)

## 2020-06-19 LAB — MAGNESIUM: Magnesium: 1.9 mg/dL (ref 1.7–2.4)

## 2020-06-19 MED ORDER — MAGNESIUM GLUCONATE 30 MG PO TABS: 30.0000 mg | ORAL_TABLET | Freq: Every day | ORAL | 0 refills | Status: DC

## 2020-06-19 MED ORDER — HEPARIN SOD (PORK) LOCK FLUSH 100 UNIT/ML IV SOLN: 500.0000 [IU] | INTRAVENOUS | Status: DC

## 2020-06-19 MED ORDER — NICOTINE 21 MG/24HR TD PT24: 21.0000 mg | MEDICATED_PATCH | Freq: Every day | TRANSDERMAL | 0 refills | Status: DC

## 2020-06-19 MED ORDER — ACETAMINOPHEN 500 MG PO TABS: 1000.0000 mg | ORAL_TABLET | Freq: Four times a day (QID) | ORAL | 0 refills | Status: DC | PRN

## 2020-06-19 MED ORDER — RIVAROXABAN 20 MG PO TABS
20.0000 mg | ORAL_TABLET | Freq: Every day | ORAL | Status: DC
  Administered 2020-06-19: 20 mg via ORAL
  Filled 2020-06-19: qty 1

## 2020-06-19 MED ORDER — PREDNISONE 20 MG PO TABS
40.0000 mg | ORAL_TABLET | Freq: Every day | ORAL | Status: DC
  Administered 2020-06-19: 40 mg via ORAL
  Filled 2020-06-19: qty 2

## 2020-06-19 MED ORDER — HEPARIN SOD (PORK) LOCK FLUSH 100 UNIT/ML IV SOLN
500.0000 [IU] | INTRAVENOUS | Status: DC | PRN
  Administered 2020-06-19: 500 [IU]
  Filled 2020-06-19: qty 5

## 2020-06-19 MED ORDER — POTASSIUM CHLORIDE CRYS ER 20 MEQ PO TBCR: 20.0000 meq | EXTENDED_RELEASE_TABLET | Freq: Every day | ORAL | 2 refills | Status: DC

## 2020-06-19 MED ORDER — RIVAROXABAN 20 MG PO TABS
20.0000 mg | ORAL_TABLET | Freq: Every day | ORAL | 0 refills | Status: DC
Start: 2020-06-19 — End: 2020-10-20

## 2020-06-19 MED ORDER — LISINOPRIL 20 MG PO TABS
20.0000 mg | ORAL_TABLET | Freq: Every day | ORAL | Status: DC
  Administered 2020-06-19: 20 mg via ORAL
  Filled 2020-06-19: qty 1

## 2020-06-19 MED ORDER — LISINOPRIL 20 MG PO TABS
20.0000 mg | ORAL_TABLET | Freq: Every day | ORAL | 0 refills | Status: DC
Start: 2020-06-19 — End: 2020-07-15

## 2020-06-19 MED ORDER — PREDNISONE 5 MG PO TABS: ORAL_TABLET | ORAL | 1 refills | Status: DC

## 2020-06-19 MED FILL — XARELTO 20 MG TABLET: 20 | 30 days supply | Qty: 30 | Fill #1

## 2020-06-19 MED FILL — NICOTINE 21 MG/24HR PATCH: 21 | 28 days supply | Qty: 28 | Fill #0

## 2020-06-19 MED FILL — LISINOPRIL 20 MG TABS: 20 | 30 days supply | Qty: 30 | Fill #0

## 2020-06-19 NOTE — Plan of Care (Signed)

## 2020-06-19 NOTE — Discharge Summary (Signed)
Discharge Summary  Sandra Miller IWP:[id number] DOB: 09-27-1975  PCP: Rutherford Guys, MD  Admit date: 06/16/2020 Discharge date: 06/19/2020  Time spent: 73mins, more than 50% time spent on coordination of care.   Recommendations for Outpatient Follow-up:  1. F/u with oncology Dr Lindi Adie next week  for hospital discharge follow up, repeat cbc/bmp at follow up. 2. F/u with pcp   New meds on discharge xarelto Prednisone taper Nicotine patch magnesium  Changed meds: Change lisinopril/hctz to lisinopril only    Discharge Diagnoses:  Active Hospital Problems   Diagnosis Date Noted  . Colitis 06/16/2020  . Hypokalemia 06/17/2020  . N&V (nausea and vomiting) 06/17/2020  . DVT (deep venous thrombosis) ([place]) 06/16/2020  . Malignant neoplasm of upper-inner quadrant of left breast in female, estrogen receptor positive ([place]) 02/25/2020  . Vulvar cancer ([place]) 01/29/2020  . Essential hypertension 10/31/2019    Resolved Hospital Problems  No resolved problems to display.    Discharge Condition: stable  Diet recommendation: regular diet  Filed Weights   06/17/20 1536 06/18/20 0300  Weight: 83.5 kg 83.5 kg    History of present illness: ( per admitting MD Dr Hal Hope) PCP: Rutherford Guys, MD  Patient coming from: Home.  Chief Complaint: Nausea vomiting and diarrhea.  HPI: Sandra Miller is a 44 y.o. female with history of breast and vulvar cancer being followed by oncologist last chemotherapy around 3 weeks ago with history of jugular vein DVT just was started on Coumadin was on Xarelto which patient is notable for with history of hypertension and bipolar disorder has been experiencing intractable nausea vomiting diarrhea with abdominal pain for the last 2 days.  Denies any recent sick contact or antibiotic use.  Abdominal pain is mostly in the left lower quadrant.  Denies any fever chills or blood in the vomitus or diarrhea.  ED Course: In the ER patient had  repeated episodes of vomiting.  CT abdomen pelvis was showing colitis involving the left lower quadrant.  Patient was started on fluids antibiotics and admitted for further management.  Covid test was negative.  Labs are significant for leukocytosis with hemoglobin around 11.9 potassium of 2.9 blood glucose of 169.  Hospital Course:  Principal Problem:   Colitis Active Problems:   Essential hypertension   Vulvar cancer ([place])   Malignant neoplasm of upper-inner quadrant of left breast in female, estrogen receptor positive ([place])   DVT (deep venous thrombosis) ([facility])   Hypokalemia   N&V (nausea and vomiting)   Nausea /vomiting/ diarrhea -C. difficile negative, GI PCR panel negative -She was initially treated as colitis with Rocephin and Flagyl, case discussed with oncology Dr. Lindi Adie who advised symptoms likely delayed reaction to patient cancer treatment, can hold of antibiotics and observe -Symptom improved, advance diet as tolerated, continue supportive care  Severe hypokalemia and hypomagnesemia -Replace K and mag aggressively, discontinue  HCTZ -discharged on k/mag supplement, repeat labs next week at follow up appointment  Leukocytosis -Per Dr. Lindi Adie patient received G-CSF -Hold off antibiotic -she has no fever, presenting symptom resolved, she does not appear septic -wbc improved, repeat cbc next week at follow up appointment  Elevation of LFT -Initial CT abdomen did not show any hepatobiliary abnormality -Case discussed with oncology Dr. Lindi Adie who advised to treat as  drug reaction versus auto immune -Recommend DC Rocephin and Flagyl, empirically start steroid treatment -Repeat LFT improved, no n/v at discharge -she is discharged on prednisone taper and follow up with Dr Lindi Adie next week  Jugular vein DVT -treated with therapeutic Lovenox when not able to take po meds -Plan to discharge on Xarelto  (she has funds from cancer center that covers Xarelto  cost)  Hypertension, present with hypovolemia hypotension Received IV hydration, blood pressure improved, she is discharged on lisinopril , discontinue HCTZ.  History of bipolar disorder and Zyprexa Effexor and trazodone  Body mass index is 30.17 kg/m.   DVT prophylaxis: On therapeutic Lovenox  Code Status: Full Family Communication: Patient Disposition:   Status is: Inpatient  Dispo: The patient is from: Home  Anticipated d/c is to: Home  Consultants:   Phone conversation with oncology Dr. Lindi Adie  Procedures:   None  Antimicrobials:   Rocephin and Flagyl on 10/13   Discharge Exam: BP 139/83   Pulse 99   Temp 98.1 F (36.7 C) (Oral)   Resp 16   Ht 5' 5.5" (1.664 m)   Wt 83.5 kg   SpO2 100%   BMI 30.17 kg/m   General: NAD Cardiovascular: RRR Respiratory: normal respiratory effort   Discharge Instructions You were cared for by a hospitalist during your hospital stay. If you have any questions about your discharge medications or the care you received while you were in the hospital after you are discharged, you can call the unit and asked to speak with the hospitalist on call if the hospitalist that took care of you is not available. Once you are discharged, your primary care physician will handle any further medical issues. Please note that NO REFILLS for any discharge medications will be authorized once you are discharged, as it is imperative that you return to your primary care physician (or establish a relationship with a primary care physician if you do not have one) for your aftercare needs so that they can reassess your need for medications and monitor your lab values.  Discharge Instructions    Diet general   Complete by: As directed    Increase activity slowly   Complete by: As directed      Allergies as of 06/19/2020      Reactions   Dilaudid [hydromorphone Hcl] Other (See Comments)   Pt became confused, pulled  out iv's, does not remember anything   Depakote [divalproex Sodium] Nausea And Vomiting   Minocycline Hives   Aspirin Hives   States able to tolerate [employer] without any problem except for GI upset      Medication List    STOP taking these medications   lidocaine 5 % ointment Commonly known as: XYLOCAINE   lisinopril-hydrochlorothiazide 20-12.5 MG tablet Commonly known as: ZESTORETIC   warfarin 5 MG tablet Commonly known as: COUMADIN     TAKE these medications   acetaminophen 500 MG tablet Commonly known as: TYLENOL Take 2 tablets (1,000 mg total) by mouth every 6 (six) hours as needed for moderate pain or fever. Avoid tylenol until your liver function back to normal What changed: additional instructions   diphenoxylate-atropine 2.5-0.025 MG tablet Commonly known as: LOMOTIL TAKE 1 TO 2 TABLETS BY MOUTH FOUR TIMES DAILY AS NEEDED FOR DIARRHEA What changed:   how much to take  how to take this  when to take this  reasons to take this   dronabinol 2.5 MG capsule Commonly known as: MARINOL Take 1 capsule (2.5 mg total) by mouth 2 (two) times daily before a meal.   gabapentin 300 MG capsule Commonly known as: NEURONTIN Take 1 capsule (300 mg total) by mouth daily.   lidocaine-prilocaine cream Commonly known  as: EMLA Apply 1 application topically as needed (to port).   lisinopril 20 MG tablet Commonly known as: ZESTRIL Take 1 tablet (20 mg total) by mouth daily.   LORazepam 1 MG tablet Commonly known as: ATIVAN Take 1 tablet (1 mg total) by mouth 3 (three) times daily as needed for anxiety.   magnesium gluconate 30 MG tablet Commonly known as: MAGONATE Take 1 tablet (30 mg total) by mouth at bedtime.   nicotine 21 mg/24hr patch Commonly known as: NICODERM CQ - dosed in mg/24 hours Place 1 patch (21 mg total) onto the skin daily.   OLANZapine 15 MG tablet Commonly known as: ZYPREXA Take 1 tablet (15 mg total) by mouth at bedtime.   omeprazole 40  MG capsule Commonly known as: PRILOSEC TAKE 1 CAPSULE BY MOUTH TWO TIMES DAILY BEFORE A MEAL What changed:   how much to take  how to take this  when to take this   potassium chloride SA 20 MEQ tablet Commonly known as: KLOR-CON Take 1 tablet (20 mEq total) by mouth daily. What changed: when to take this   predniSONE 5 MG tablet Commonly known as: DELTASONE Take with breakfast,  Take 30mg  on day one, then 20mg  on day two, then 10mg  on day three, then 5mg  on day four, then stop. What changed:   how much to take  how to take this  when to take this  additional instructions   prochlorperazine 10 MG tablet Commonly known as: COMPAZINE TAKE 1 TABLET BY MOUTH EVERY 6 HOURS AS NEEDED FOR NAUSEA OR VOMITING What changed: See the new instructions.   promethazine 25 MG tablet Commonly known as: PHENERGAN Take 1 tablet (25 mg total) by mouth every 6 (six) hours as needed for nausea.   rivaroxaban 20 MG Tabs tablet Commonly known as: XARELTO Take 1 tablet (20 mg total) by mouth daily with supper.   topiramate 25 MG tablet Commonly known as: TOPAMAX TAKE 1 TABLET BY MOUTH AT BEDTIME   traZODone 100 MG tablet Commonly known as: DESYREL Take 1 tablet (100 mg total) by mouth at bedtime. What changed:   when to take this  reasons to take this   venlafaxine XR 37.5 MG 24 hr capsule Commonly known as: Effexor XR Take three capsule (112.5 mg total) by mouth daily. What changed:   how much to take  how to take this  when to take this      Allergies  Allergen Reactions  . Dilaudid [Hydromorphone Hcl] Other (See Comments)    Pt became confused, pulled out iv's, does not remember anything  . Depakote [Divalproex Sodium] Nausea And Vomiting  . Minocycline Hives  . Aspirin Hives    States able to tolerate [employer] without any problem except for GI upset    Follow-up Information    Rutherford Guys, MD Follow up.   Specialty: Family Medicine Contact  information: [address] [place]  [address] [phone]        Nicholas Lose, MD Follow up.   Specialty: Hematology and Oncology Contact information: [place] [address] [phone]                The results of significant diagnostics from this hospitalization (including imaging, microbiology, ancillary and laboratory) are listed below for reference.    Significant Diagnostic Studies: CT HEAD WO CONTRAST  Result Date: 06/17/2020 CLINICAL DATA:  Mental status changes. EXAM: CT HEAD WITHOUT CONTRAST TECHNIQUE: Contiguous axial images were obtained from  the base of the skull through the vertex without intravenous contrast. COMPARISON:  01/01/2012 FINDINGS: Brain: There is no evidence for acute hemorrhage, hydrocephalus, mass lesion, or abnormal extra-axial fluid collection. No definite CT evidence for acute infarction. Vascular: No hyperdense vessel or unexpected calcification. Skull: No evidence for fracture. No worrisome lytic or sclerotic lesion. Sinuses/Orbits: The visualized paranasal sinuses and mastoid air cells are clear. Visualized portions of the globes and intraorbital fat are unremarkable. Other: None. IMPRESSION: Unremarkable exam.  No acute intracranial abnormality. Electronically Signed   By: Misty Stanley M.D.   On: 06/17/2020 06:49   CT ABDOMEN PELVIS W CONTRAST  Result Date: 06/16/2020 CLINICAL DATA:  44 year old female with 2 days of nausea, abdominal pain, diarrhea. History of breast and vulvar cancer. EXAM: CT ABDOMEN AND PELVIS WITH CONTRAST TECHNIQUE: Multidetector CT imaging of the abdomen and pelvis was performed using the standard protocol following bolus administration of intravenous contrast. CONTRAST:  171mL OMNIPAQUE IOHEXOL 300 MG/ML  SOLN COMPARISON:  CT Abdomen and Pelvis 03/08/2016 FINDINGS: Lower chest: Cardiac size at the upper limits of normal. No pericardial effusion. Negative lung bases. Hepatobiliary:  Negative liver and gallbladder. Pancreas: Negative. Spleen: Negative. Adrenals/Urinary Tract: Normal adrenal glands. Renal enhancement and contrast excretion is symmetric and within normal limits. Normal proximal ureters. There is punctate bilateral nephrolithiasis, including in the left lower pole on coronal image 69. But negative course of both ureters today. Chronic pelvic phleboliths. Unremarkable urinary bladder. Stomach/Bowel: Negative rectum. Decompressed large bowel from the sigmoid colon through the ascending colon. There is some gas at the tip of the cecum. Of the decompressed segments. The descending colon appears most indistinct (coronal image 74). Mild if any associated mesenteric stranding. Appendix remains within normal limits (coronal image 55). Negative terminal ileum. No dilated small bowel. The stomach is distended with fluid but otherwise within normal limits. No free air. No free fluid. Vascular/Lymphatic: Mild Calcified aortic atherosclerosis. Major arterial structures are patent. Portal venous system is patent. No lymphadenopathy. Reproductive: Negative. Other: Subtle asymmetric increased density of the right labia probably related to the stated history of vulvar cancer. Otherwise negative visible perineum. No pelvic free fluid. Musculoskeletal: Chronic degeneration at the lumbosacral junction. No acute or suspicious osseous lesion identified. IMPRESSION: 1. Appearance suspicious for mild Acute Colitis, most pronounced in the descending colon. No associated free fluid or complicating features. 2. Punctate bilateral nephrolithiasis. No obstructive uropathy. 3. Otherwise negative CT appearance of the abdomen and pelvis. Electronically Signed   By: Genevie Ann M.D.   On: 06/16/2020 22:00    Microbiology: Recent Results (from the past 240 hour(s))  Respiratory Panel by RT PCR (Flu A&B, Covid) - Nasopharyngeal Swab     Status: None   Collection Time: 06/16/20 10:38 PM   Specimen: Nasopharyngeal  Swab  Result Value Ref Range Status   SARS Coronavirus 2 by RT PCR NEGATIVE NEGATIVE Final    Comment: (NOTE) SARS-CoV-2 target nucleic acids are NOT DETECTED.  The SARS-CoV-2 RNA is generally detectable in upper respiratoy specimens during the acute phase of infection. The lowest concentration of SARS-CoV-2 viral copies this assay can detect is 131 copies/mL. A negative result does not preclude SARS-Cov-2 infection and should not be used as the sole basis for treatment or other patient management decisions. A negative result may occur with  improper specimen collection/handling, submission of specimen other than nasopharyngeal swab, presence of viral mutation(s) within the areas targeted by this assay, and inadequate number of viral copies (<131 copies/mL). A negative result  must be combined with clinical observations, patient history, and epidemiological information. The expected result is Negative.  Fact Sheet for Patients:  PinkCheek.be  Fact Sheet for Healthcare Providers:  GravelBags.it  This test is no t yet approved or cleared by the [place] FDA and  has been authorized for detection and/or diagnosis of SARS-CoV-2 by FDA under an Emergency Use Authorization (EUA). This EUA will remain  in effect (meaning this test can be used) for the duration of the COVID-19 declaration under Section 564(b)(1) of the Act, 21 U.S.C. section 360bbb-3(b)(1), unless the authorization is terminated or revoked sooner.     Influenza A by PCR NEGATIVE NEGATIVE Final   Influenza B by PCR NEGATIVE NEGATIVE Final    Comment: (NOTE) The Xpert Xpress SARS-CoV-2/FLU/RSV assay is intended as an aid in  the diagnosis of influenza from Nasopharyngeal swab specimens and  should not be used as a sole basis for treatment. Nasal washings and  aspirates are unacceptable for Xpert Xpress SARS-CoV-2/FLU/RSV  testing.  Fact Sheet for  Patients: PinkCheek.be  Fact Sheet for Healthcare Providers: GravelBags.it  This test is not yet approved or cleared by the [place] FDA and  has been authorized for detection and/or diagnosis of SARS-CoV-2 by  FDA under an Emergency Use Authorization (EUA). This EUA will remain  in effect (meaning this test can be used) for the duration of the  Covid-19 declaration under Section 564(b)(1) of the Act, 21  U.S.C. section 360bbb-3(b)(1), unless the authorization is  terminated or revoked. Performed at [facility], [place] [address] [place], [place] [address]   Gastrointestinal Panel by PCR , Stool     Status: None   Collection Time: 06/17/20  9:00 AM   Specimen: STOOL  Result Value Ref Range Status   Campylobacter species NOT DETECTED NOT DETECTED Final   Plesimonas shigelloides NOT DETECTED NOT DETECTED Final   Salmonella species NOT DETECTED NOT DETECTED Final   Yersinia enterocolitica NOT DETECTED NOT DETECTED Final   Vibrio species NOT DETECTED NOT DETECTED Final   Vibrio cholerae NOT DETECTED NOT DETECTED Final   Enteroaggregative E coli (EAEC) NOT DETECTED NOT DETECTED Final   Enteropathogenic E coli (EPEC) NOT DETECTED NOT DETECTED Final   Enterotoxigenic E coli (ETEC) NOT DETECTED NOT DETECTED Final   Shiga like toxin producing E coli (STEC) NOT DETECTED NOT DETECTED Final   Shigella/Enteroinvasive E coli (EIEC) NOT DETECTED NOT DETECTED Final   Cryptosporidium NOT DETECTED NOT DETECTED Final   Cyclospora cayetanensis NOT DETECTED NOT DETECTED Final   Entamoeba histolytica NOT DETECTED NOT DETECTED Final   Giardia lamblia NOT DETECTED NOT DETECTED Final   Adenovirus F40/41 NOT DETECTED NOT DETECTED Final   Astrovirus NOT DETECTED NOT DETECTED Final   Norovirus GI/GII NOT DETECTED NOT DETECTED Final   Rotavirus A NOT DETECTED NOT DETECTED Final   Sapovirus (I, II, IV, and V) NOT DETECTED NOT  DETECTED Final    Comment: Performed at [facility], [place] [place], [place] [address]  C Difficile Quick Screen w PCR reflex     Status: None   Collection Time: 06/17/20  9:00 AM   Specimen: STOOL  Result Value Ref Range Status   C Diff antigen NEGATIVE NEGATIVE Final   C Diff toxin NEGATIVE NEGATIVE Final   C Diff interpretation No C. difficile detected.  Final    Comment: Performed at [facility], [place] [address] [place], [place] [address]     Labs: Basic  Metabolic Panel: Recent Labs  Lab 06/16/20 2047 06/17/20 0315 06/17/20 0900 06/17/20 1430 06/18/20 0600 06/19/20 0559  NA 138 137  --  139 141 140  K 2.9* 2.5*  --  2.6* 3.6 4.3  CL 102 103  --  107 111 110  CO2 23 19*  --  22 22 22   GLUCOSE 169* 223*  --  143* 118* 105*  BUN 12 14  --  10 6 10   CREATININE 0.83 0.85  --  0.49 0.66 0.55  CALCIUM 9.5 9.1  --  8.8* 8.6* 9.0  MG  --   --  0.3* 1.5* 1.5* 1.9   Liver Function Tests: Recent Labs  Lab 06/16/20 2047 06/17/20 0315 06/18/20 0600 06/19/20 0559  AST 65* 132* 904* 546*  ALT 26 <5 193* 181*  ALKPHOS 38 38 34* 36*  BILITOT 0.9 0.5 0.5 0.4  PROT 7.2 7.2 6.4* 6.8  ALBUMIN 3.9 3.7 3.5 3.6   Recent Labs  Lab 06/16/20 2047  LIPASE 32   No results for input(s): AMMONIA in the last 168 hours. CBC: Recent Labs  Lab 06/16/20 1949 06/17/20 0315 06/17/20 0900 06/19/20 0559  WBC 19.4* 20.5* 23.2* 10.6*  NEUTROABS 17.1* 18.8* 21.5* 9.2*  HGB 11.9* 10.0* 10.3* 9.1*  HCT 35.4* 30.4* 30.4* 28.5*  MCV 97.3 99.0 98.4 102.2*  PLT 317 242 251 198   Cardiac Enzymes: No results for input(s): CKTOTAL, CKMB, CKMBINDEX, TROPONINI in the last 168 hours. BNP: BNP (last 3 results) No results for input(s): BNP in the last 8760 hours.  ProBNP (last 3 results) No results for input(s): PROBNP in the last 8760 hours.  CBG: Recent Labs  Lab 06/17/20 1147  GLUCAP 131*       Signed:  Florencia Reasons MD, PhD, FACP  [facility] 06/19/2020, 11:49 AM

## 2020-06-19 NOTE — TOC Initial Note (Signed)
Transition of Care [facility]) - Initial/Assessment Note    Patient Details  Name: Sandra Miller MRN: [id number] Date of Birth: Jan 21, 1976  Transition of Care [facility]) CM/SW Contact:    Lynnell Catalan, RN Phone Number: 06/19/2020, 10:47 AM  Clinical Narrative:                 This CM spoke with pt about financial needs and need for [facility]. Pt now states that she is going to borrow one from a friend. Pharmacist is working with pt on dc anticoagulant  and funding options.             Expected Discharge Date: 06/19/20                            Activities of Daily Living Home Assistive Devices/Equipment: Eyeglasses ADL Screening (condition at time of admission) Patient's cognitive ability adequate to safely complete daily activities?: Yes Is the patient deaf or have difficulty hearing?: No Does the patient have difficulty seeing, even when wearing glasses/contacts?: No Does the patient have difficulty concentrating, remembering, or making decisions?: No Patient able to express need for assistance with ADLs?: Yes Does the patient have difficulty dressing or bathing?: Yes Independently performs ADLs?: No Communication: Independent Dressing (OT): Needs assistance Is this a change from baseline?: Pre-admission baseline Grooming: Independent Feeding: Independent Bathing: Needs assistance Is this a change from baseline?: Pre-admission baseline Toileting: Needs assistance Is this a change from baseline?: Pre-admission baseline In/Out Bed: Needs assistance Is this a change from baseline?: Pre-admission baseline Walks in Home: Needs assistance Is this a change from baseline?: Pre-admission baseline Does the patient have difficulty walking or climbing stairs?: Yes Weakness of Legs: Both Weakness of Arms/Hands: Both  Permission Sought/Granted                  Emotional Assessment              Admission diagnosis:  Dehydration [E86.0] Colitis [K52.9] N&V (nausea and  vomiting) [R11.2] Patient Active Problem List   Diagnosis Date Noted  . Hypokalemia 06/17/2020  . N&V (nausea and vomiting) 06/17/2020  . Dehydration   . DVT (deep venous thrombosis) ([place]) 06/16/2020  . Colitis 06/16/2020  . Acute embolism and thrombosis of right internal jugular vein ([place]) 06/12/2020  . Port-A-Cath in place 03/11/2020  . Malignant neoplasm of upper-inner quadrant of left breast in female, estrogen receptor positive ([place]) 02/25/2020  . Vulvar cancer ([place]) 01/29/2020  . Vulvar ulceration 01/23/2020  . Essential hypertension 10/31/2019  . Nausea and vomiting 12/27/2018  . Seasonal allergic rhinitis 12/27/2018  . History of ELISA positive for HSV 10/01/2018  . Restless leg syndrome 04/01/2016  . Nephrolithiasis 03/08/2016  . Obstructive pyelonephritis 03/08/2016  . Normocytic anemia 03/08/2016  . Healthcare maintenance 12/24/2015  . Hidradenitis suppurativa of left axilla 11/03/2015  . Loss of weight 09/08/2015  . Neck pain on left side 10/22/2014  . Genital warts 05/20/2014  . GERD (gastroesophageal reflux disease) 12/26/2012  . Functional dyspepsia 12/26/2012  . Tobacco abuse 08/22/2012  . H/O: HTN (hypertension) 03/19/2009  . Herpes simplex virus (HSV) infection 02/03/2007  . DISORDER, BIPOLAR NOS 02/03/2007   PCP:  Rutherford Guys, MD Pharmacy:   [place], [place] [address] Phone: [phone] Fax: [phone]     Social Determinants of Health (SDOH) Interventions    Readmission Risk Interventions No  flowsheet data found.

## 2020-06-19 NOTE — Discharge Instructions (Signed)
Information on my medicine - XARELTO (rivaroxaban)  This medication education was reviewed with me or my healthcare representative as part of my discharge preparation.  The pharmacist that spoke with me during my hospital stay was: [facility]? Xarelto was prescribed to treat blood clots and to reduce the risk of them occurring again.  What do you need to know about Xarelto? The dose is one 20 mg tablet taken ONCE A DAY with your largest meal.  DO NOT stop taking Xarelto without talking to the health care provider who prescribed the medication.  Refill your prescription for 20 mg tablets before you run out.  After discharge, you should have regular check-up appointments with your healthcare provider that is prescribing your Xarelto.  In the future your dose may need to be changed if your kidney function changes by a significant amount.  What do you do if you miss a dose? When you are taking Xarelto ONCE DAILY and you miss a dose, take it as soon as you remember on the same day then continue your regularly scheduled once daily regimen the next day. Do not take two doses of Xarelto at the same time.   Important Safety Information Xarelto is a blood thinner medicine that can cause bleeding. You should call your healthcare provider right away if you experience any of the following: ? Bleeding from an injury or your nose that does not stop. ? Unusual colored urine (red or dark brown) or unusual colored stools (red or black). ? Unusual bruising for unknown reasons. ? A serious fall or if you hit your head (even if there is no bleeding).  Some medicines may interact with Xarelto and might increase your risk of bleeding while on Xarelto. To help avoid this, consult your healthcare provider or pharmacist prior to using any new prescription or non-prescription medications, including herbals, vitamins, non-steroidal anti-inflammatory drugs (NSAIDs) and  supplements.  This website has more information on Xarelto: [email or web address]

## 2020-06-19 NOTE — Progress Notes (Signed)
ANTICOAGULATION CONSULT NOTE - Follow Up Consult  Pharmacy Consult for transition from enoxaparin to Xarelto Indication: Jugular vein thrombosis   Allergies  Allergen Reactions  . Dilaudid [Hydromorphone Hcl] Other (See Comments)    Pt became confused, pulled out iv's, does not remember anything  . Depakote [Divalproex Sodium] Nausea And Vomiting  . Minocycline Hives  . Aspirin Hives    States able to tolerate Goody Powders without any problem except for GI upset    Patient Measurements: Height: 5' 5.5" (166.4 cm) Weight: 83.5 kg (184 lb 1.4 oz) IBW/kg (Calculated) : 58.15  Vital Signs: Temp: 98.1 F (36.7 C) (10/15 0609) Temp Source: Oral (10/15 0609) BP: 151/96 (10/15 0609) Pulse Rate: 99 (10/15 0609)  Labs: Recent Labs    06/17/20 0315 06/17/20 0315 06/17/20 0900 06/17/20 1430 06/18/20 0600 06/19/20 0559  HGB 10.0*   < > 10.3*  --   --  9.1*  HCT 30.4*  --  30.4*  --   --  28.5*  PLT 242  --  251  --   --  198  LABPROT 14.8  --   --   --   --   --   INR 1.2  --   --   --   --   --   CREATININE 0.85   < >  --  0.49 0.66 0.55   < > = values in this interval not displayed.    Estimated Creatinine Clearance: 94.7 mL/min (by C-G formula based on SCr of 0.55 mg/dL).   Medications:  Pt is currently on enoxaparin 85 mg subQ q12h. Pt last enoxaparin dose was on 10/14 at 21:33.  PTA Xarelto 20 mg daily last dose on 10/12   Assessment: Pt is a 76 YOF admitted on 10/12 with complaints of N/V and abdominal pain. PMH significant for jugular vein thrombosis. Pt was previously on Xarelto 20 mg once daily with the largest meal. Pt has had recent problems with nausea/vomiting and has not been eating very much. Pt reported not being able to afford Xarelto, so she was prescribed warfarin, but has not started it yet.   Pt now has patient assistance that will help her be able to afford the Xarelto, which she already has a prescription for. Pt platelets remain normal, Hgb is low  and has been trending down since 10/13. Renal function is normal. LFTs are elevated, but have been improving.   Goal of Therapy:   Monitor platelets by anticoagulation protocol: Yes  Plan:  - Lovenox d/c today - Resume Xarelto 20 mg once daily on 10/15 - Pt was advised not to take warfarin and Xarelto - Pt was advised to take the Xarelto with the largest meal of the day  - Pt was counseled on adverse effects of bleeding to monitor for  - Confirmed with outpatient pharmacy that her prescription is filled and ready for pickup   Zella Dewan 06/19/2020,10:10 AM

## 2020-06-22 ENCOUNTER — Other Ambulatory Visit: Payer: Self-pay | Admitting: *Deleted

## 2020-06-22 ENCOUNTER — Encounter: Payer: Self-pay | Admitting: *Deleted

## 2020-06-22 NOTE — Patient Outreach (Addendum)
[facility]) Care Management  06/22/2020  CRYSTELLE FERRUFINO 05/05/1976 [id number]  Transition of care telephone call  Referral received:06/19/20 Initial outreach:06/22/20 Insurance: [employer]  Initial unsuccessful telephone call to patient's preferred number in order to complete transition of care assessment; no answer, voicemail box full unable to leave a message.   Objective: Per electronic  medical record, Lashana Cypress  was hospitalized at [facility]  from 06/22/20 for Nausea, vomiting & Diarrhea, Colitis  Comorbidities include: Jugular vein DVT, Vulvar cancer, Hypertension, Bipolar disorder, positive malignant neoplasm of left breast, Chemotherapy. She was discharged to home on 06/19/20 without the need for home health services or DME.    Plan: This RNCM will route unsuccessful outreach letter with [place] Management pamphlet and 24 hour Nurse Advice Line Magnet to [place] Management clinical pool to be mailed to patient's home address. This RNCM will attempt another outreach within 4 business days.   Joylene Draft, RN, BSN  [place] Management Coordinator  [phone]- Mobile [phone]- Toll Free Main Office

## 2020-06-23 ENCOUNTER — Encounter: Payer: Self-pay | Admitting: Genetic Counselor

## 2020-06-23 ENCOUNTER — Telehealth: Payer: Self-pay | Admitting: Genetic Counselor

## 2020-06-23 DIAGNOSIS — Z1379 Encounter for other screening for genetic and chromosomal anomalies: Secondary | ICD-10-CM | POA: Insufficient documentation

## 2020-06-23 NOTE — Telephone Encounter (Signed)
Contacted patient in attempt to disclose results of genetic testing.  Was unable to LVM due to full mailbox.

## 2020-06-24 ENCOUNTER — Ambulatory Visit
Admission: RE | Admit: 2020-06-24 | Discharge: 2020-06-24 | Disposition: A | Discharge status: Home / Self Care | Payer: Self-pay | Source: Ambulatory Visit | Attending: Hematology and Oncology | Admitting: Hematology and Oncology

## 2020-06-24 ENCOUNTER — Ambulatory Visit: Payer: Self-pay | Admitting: Genetic Counselor

## 2020-06-24 DIAGNOSIS — C50212 Malignant neoplasm of upper-inner quadrant of left female breast: Secondary | ICD-10-CM

## 2020-06-24 DIAGNOSIS — Z1379 Encounter for other screening for genetic and chromosomal anomalies: Secondary | ICD-10-CM

## 2020-06-24 DIAGNOSIS — Z1589 Genetic susceptibility to other disease: Secondary | ICD-10-CM | POA: Insufficient documentation

## 2020-06-24 DIAGNOSIS — Z170 Estrogen receptor positive status [ER+]: Secondary | ICD-10-CM

## 2020-06-24 DIAGNOSIS — Z1501 Genetic susceptibility to malignant neoplasm of breast: Secondary | ICD-10-CM

## 2020-06-24 MED ORDER — GADOBUTROL 1 MMOL/ML IV SOLN
8.0000 mL | Freq: Once | INTRAVENOUS | Status: AC | PRN
  Administered 2020-06-24: 8 mL via INTRAVENOUS

## 2020-06-24 NOTE — Progress Notes (Signed)
GENETIC TEST RESULTS   Patient Name: Sandra Miller Patient Age: 44 y.o. Encounter Date: June 24, 2020   Referring Provider: Nicholas Lose, MD   Sandra Miller was seen in the [facility] clinic on June 01, 2020 due to a personal history of breast cancer, family history of gynecological cancer (possible ovarian cancer), and concern regarding a hereditary predisposition to cancer in the family. Please refer to the prior Genetics clinic note for more information regarding Sandra Miller's medical and family histories and our assessment at the time.   CANCER HISTORY:  In May of 2021, at the age of 27, Sandra Miller was diagnosed with vulvar cancer. The treatment plan included chemotherapy.  In June of 2021, at the age of 73, Sandra Miller was diagnosed with invasive ductal carcinoma and ductal carcinoma in situ of the left breast.  She is currently undergoing chemotherapy with preliminary plans for breast conserving surgery and adjuvant radiation.     FAMILY HISTORY:  We obtained a detailed, 4-generation family history.  Significant diagnoses are listed below:  Family History  Problem Relation Age of Onset  . Colon polyps Brother   . Cancer Paternal Grandfather        "stomach"  . Cancer Cousin        maternal; ovarian cancer or other "female" cancer?  . Lung cancer Paternal Uncle 44  . Throat cancer Cousin        paternal; dx 6s  . Lung cancer Cousin        paternal; dx 55s     Sandra Miller has one son, age 73, and one daughter, age 56, both without a cancer history.  Sandra Miller has two full brothers in their 4s.  One brother has had several precancerous polyps.  Sandra Miller could not recall the number of polyps he has had.  Sandra Miller also has one maternal sister, age 39, without a cancer history.  Sandra Miller mother is 7 and does not have cancer.  Sandra Miller maternal cousin was diagnosed with ovarian cancer or another "female cancer" in her 96s. No other maternal history of cancer was reported.   Sandra Miller father passed away at 34 and had a diagnosis of lung cancer.  Sandra Miller also had a maternal uncle diagnosed with lung cancer who passed away at 51.  Sandra Miller had a paternal female cousin with lung cancer who passed away in their 56s and a paternal female cousin who was diagnosed with throat and mouth cancer in her 21s.  Sandra Miller paternal grandfather had an unknown type of cancer/"stomach" cancer diagnosed after the age of 33.  No other family history of cancer was reported.   Sandra Miller is unaware of previous family history of genetic testing for hereditary cancer risks. Patient's maternal ancestors are of White/Caucasian descent, and paternal ancestors are of [place] American descent. There is no reported Ashkenazi Jewish ancestry. There is no known consanguinity.  GENETIC TESTING:  Genetic testing reported out on June 22, 2020 through [employer]. A single pathogenic variant was detected in the BRIP1 gene called c.2010dup (p.Glu671*).   The Common Hereditary Cancers Panel offered by [facility] includes sequencing and/or deletion duplication testing of the following 48 genes: APC, ATM, AXIN2, BARD1, BMPR1A, BRCA1, BRCA2, BRIP1, CDH1, CDK4, CDKN2A (p14ARF), CDKN2A (p16INK4a), CHEK2, CTNNA1, DICER1, EPCAM (Deletion/duplication testing only), GREM1 (promoter region deletion/duplication testing only), KIT, MEN1, MLH1, MSH2, MSH3, MSH6, MUTYH, NBN, NF1, NHTL1, PALB2, PDGFRA, PMS2, POLD1, POLE, PTEN, RAD50, RAD51C, RAD51D, RNF43, SDHB, SDHC,  SDHD, SMAD4, SMARCA4. STK11, TP53, TSC1, TSC2, and VHL.  The following genes were evaluated for sequence changes only: SDHA and HOXB13 c.251G>A variant only.     Genetic testing did identify a variant of uncertain significance (VUS) in the POLD1 gene called c.532G>A (p.Gly178Arg).  At this time, it is unknown if this variant is associated with increased cancer risk or if this is a normal finding, but most variants such as this get reclassified to  being inconsequential. It should not be used to make medical management decisions. With time, we suspect the lab will determine the significance of this variant, if any. If we do learn more about it, we will try to contact Sandra Miller to discuss it further. However, it is important to stay in touch with [place] periodically and keep the address and phone number up to date.    BRIP1 CANCER RISKS & RECOMMENDATIONS:  We discussed that women who are carriers of a single pathogenic variant in the BRIP1 gene have an increased risk of ovarian cancer and possibly breast cancer. The risk for ovarian cancer in these women is estimated to be between 8 and 15% (compared to the general population ovarian cancer risk of approximately 1.3%). There is preliminary evidence supporting a correlation with BRIP1 and predisposition to breast cancer; however, the available evidence is insufficient to make a determination regarding this relationship. An individual with a BRIP1 pathogenic variant will not necessarily develop cancer in their lifetime, but the risk for cancer is increased over the general population risk.    Based on current understanding, men don't appear to have increased cancer risks due to BRIP1 mutations. However, men can carry the mutation and pass it on to their children. Additionally, information regarding female cancer risk could change as more research is done to understand BRIP1 mutations.   Ovarian Cancer Management:  The National Comprehensive Cancer Network (NCCN) recommends consideration of prophylactic salpingo-oophorectomy (surgical removal of the ovaries and fallopian tubes) for women with a pathogenic variant in BRIP1 after childbearing is complete. The current evidence is insufficient to make a firm recommendation as to the optimal age for this procedure. However, based on the current, limited evidence base, a discussion about surgery should be held around 31-45 years of age or earlier based on a specific  family history of early-onset ovarian cancer (Artist. Genetic/Familial High-Risk Assessment: Breast, Ovarian, and Pancreatic; Version 2.2021). Women electing to defer prophylactic oophorectomy can consider screening with serum CA-125 and transvaginal ultrasound; however, data do not support such screening and it should not be a substitute for preventive surgery.    Breast Cancer Management:  The current NCCN guidelines do not recommend additional breast cancer screening for individuals with a single pathogenic BRIP1 variant beyond what is recommended for the general population. However, they caution that cancer screening should ultimately be guided by personal and family history (Artist. Genetic/Familial High-Risk Assessment: Breast, Ovarian, and Pancreatic; Version 2.2021).   An individual's cancer risk and medical management are not determined by genetic test results alone. Overall cancer risk assessment incorporates additional factors, including personal medical history, family history, and any available genetic information that may result in a personalized plan for cancer prevention and surveillance.   Inheritance & Risk for Family Members:  Hereditary predisposition to cancer due to a single pathogenic variant in the BRIP1 gene has autosomal dominant inheritance. This means that an individual with a pathogenic variant has a 50% chance of passing the condition on to their  offspring. Once such a variant is detected in an individual, it is possible to identify at-risk relatives who can pursue testing for this specific familial variant. Many cases are inherited from a parent, but some cases can occur spontaneously (i.e., an individual with a pathogenic variant has parents who do not have it).    Sandra Miller children, siblings, and parents are at 50% risk to have inherited the mutation found in her. We recommend they have genetic testing for  this same mutation, as identifying the presence of this mutation would allow them to also take advantage of risk-reducing measures. It is important that all of Ms. Crist relatives (both men and women) know of the presence of this gene mutation in the family.  Women need to know that they may be at increased risk for ovarian cancer and possibly breast cancer. Information regarding both female and female cancer risk could change as more research is done to understand BRIP1 mutations. Genetic testing can sort out who in the family is at risk and who is not.  We would be happy to help meet with and coordinate genetic testing for any relative that is interested.   Additionally, individuals with a pathogenic variant in BRIP1 are carriers of Fanconi anemia type J. Fanconi anemia is an autosomal recessive disorder that is characterized by bone marrow failure and variable presentation of anomalies, including short stature, abnormal skin pigmentation, abnormal thumbs, malformations of the skeletal and central nervous systems, and developmental delay (PMID: 3845364, 68032122). Risks for leukemia and early onset solid tumors are significantly elevated (PMID: 48250037, 04888916, 94503888). For there to be a risk of Fanconi anemia in offspring, both parents would each have to have a single pathogenic variant in [place]; in such a case, the risk of having an affected child is 25%.   Lastly, we discussed that our knowledge of cancer risks related to BRIP1 mutations will continue to evolve. We recommended that Ms. Llanas follow up with the genetics clinic annually so we can provide her with the most current information about BRIP1 and cancer risk, as well as with any changes to her family history (new cancer diagnoses, genetic test results).   Our contact number was provided. Ms. Vankleeck questions were answered to her satisfaction, and she knows she is welcome to call us at anytime with additional questions or concerns.  Ms. [name]  will be contacted in approximately 3 weeks by phone in order to determine if there are additional questions.   Valeska Haislip M. Joette Catching, [place], [facility] Certified Film/video [email or web address] (P) [phone]

## 2020-06-24 NOTE — Telephone Encounter (Signed)
Revealed pathogenic variant in BRIP1 gene.  Discussed cancer risks, management strategies, and implications for family members.  Revealed variant of uncertain significance in POLD1.  Patient was offered a virtual or in-person follow-up appointment to discuss BRIP1 mutation and declined. More in-depth information about BRIP1 was discussed over the phone, a letter containing the information will be sent, and Sandra Miller will be contacted in 2-3 weeks by phone to discuss further and answer questions.

## 2020-06-25 ENCOUNTER — Other Ambulatory Visit: Payer: Self-pay | Admitting: *Deleted

## 2020-06-25 ENCOUNTER — Telehealth (HOSPITAL_COMMUNITY): Payer: Self-pay | Admitting: Psychiatry

## 2020-06-25 ENCOUNTER — Other Ambulatory Visit: Payer: Self-pay

## 2020-06-25 ENCOUNTER — Encounter: Payer: Self-pay | Admitting: *Deleted

## 2020-06-25 NOTE — Patient Outreach (Signed)
[facility]) Care Management  06/25/2020  LASHELL MOFFITT 1976/05/26 [id number]   Transition of care call/case closure   Referral received:06/19/20 Initial outreach:06/22/20 Insurance: [employer]( patient states no longer has plan)   Subjective: 2nd attempt successful telephone call to patient's preferred number in order to complete transition of care assessment; 2 HIPAA identifiers verified. Explained purpose of call.  Patient explained that she no longer has focus insurance plan, she states she is listed as  non working and does not have benefits. She explained that she has applied for Medicaid, disability already due to current medical condition states.  She declined completing transition of care assessment. She states that is doing okay, trying to make it everday. She states that she has all on her medications , taking as prescribed and has follow up with oncology on 10/22.    Objective:  Deniqua Rota was hospitalized [facility]  from 06/22/20 for Nausea, vomiting & Diarrhea, Colitis  Comorbidities include: Jugular vein DVT, Vulvar cancer, Hypertension, Bipolar disorder, positive malignant neoplasm of left breast, Chemotherapy. She was discharged to home on 06/19/20 without the need for home health servicesor DME.  See transition of care flowsheet for assessment details.   Plan:  Will close case to [facility] care management as patient states she no longer has insurance plan    Joylene Draft, RN, BSN  [place] Management Coordinator  [phone]- Mobile [phone]- [place]

## 2020-06-25 NOTE — Progress Notes (Signed)
Patient Care Team: Rutherford Guys, MD (Inactive) as PCP - General (Family Medicine) Mauro Kaufmann, RN as Oncology Nurse Navigator Rockwell Germany, RN as Oncology Nurse Navigator Nicholas Lose, MD as Consulting Physician (Hematology and Oncology) Alphonsa Overall, MD as Consulting Physician (General Surgery) Awanda Mink Craige Cotta, RN as Oncology Nurse Navigator (Oncology)  DIAGNOSIS:    ICD-10-CM   1. Malignant neoplasm of upper-inner quadrant of left breast in female, estrogen receptor positive ([place])  C50.212    Z17.0     SUMMARY OF ONCOLOGIC HISTORY: Oncology History  Vulvar cancer ([place])  01/29/2020 Initial Diagnosis   Vulvar cancer ([place])   03/11/2020 - 05/04/2020 Chemotherapy   The patient had dexamethasone (DECADRON) 4 MG tablet, 4 mg (100 % of original dose 4 mg), Oral, Daily, 1 of 1 cycle, Start date: 02/27/2020, End date: 05/05/2020 Dose modification: 4 mg (original dose 4 mg, Cycle 0) palonosetron (ALOXI) injection 0.25 mg, 0.25 mg, Intravenous,  Once, 1 of 1 cycle Administration: 0.25 mg (03/11/2020), 0.25 mg (03/17/2020), 0.25 mg (03/24/2020), 0.25 mg (03/31/2020), 0.25 mg (04/07/2020), 0.25 mg (04/14/2020), 0.25 mg (04/21/2020) CARBOplatin (PARAPLATIN) 300 mg in sodium chloride 0.9 % 250 mL chemo infusion, 300 mg (100 % of original dose 300 mg), Intravenous,  Once, 1 of 1 cycle Dose modification:   (original dose 300 mg, Cycle 1) Administration: 300 mg (03/11/2020), 290 mg (03/17/2020), 300 mg (03/24/2020), 230 mg (03/31/2020), 230 mg (04/07/2020), 230 mg (04/14/2020) PACLitaxel (TAXOL) 108 mg in sodium chloride 0.9 % 250 mL chemo infusion (</= 59m/m2), 50 mg/m2 = 108 mg, Intravenous,  Once, 1 of 1 cycle Dose modification: 45 mg/m2 (original dose 50 mg/m2, Cycle 1, Reason: Dose not tolerated) Administration: 108 mg (03/11/2020), 108 mg (03/17/2020), 108 mg (03/24/2020), 96 mg (03/31/2020), 96 mg (04/07/2020), 96 mg (04/14/2020) fosaprepitant (EMEND) 150 mg in sodium chloride 0.9 % 145 mL IVPB, 150 mg,  Intravenous,  Once, 1 of 1 cycle Administration: 150 mg (03/24/2020), 150 mg (03/31/2020), 150 mg (04/07/2020), 150 mg (04/14/2020) trastuzumab-anns (KANJINTI) 378 mg in sodium chloride 0.9 % 250 mL chemo infusion, 4 mg/kg = 378 mg (100 % of original dose 4 mg/kg), Intravenous,  Once, 1 of 1 cycle Dose modification: 4 mg/kg (original dose 4 mg/kg, Cycle 1, Reason: Other (see comments), Comment: loading dose) Administration: 378 mg (03/11/2020), 189 mg (03/17/2020), 189 mg (03/24/2020), 189 mg (03/31/2020), 189 mg (04/07/2020), 189 mg (04/14/2020), 189 mg (04/21/2020), 189 mg (05/04/2020)  for chemotherapy treatment.    Malignant neoplasm of upper-inner quadrant of left breast in female, estrogen receptor positive ([place]  02/20/2020 Initial Diagnosis   PET scan on 02/07/20 following a diagnosis of vulvar cancer showed a 1.7cm left breast mass and mildly hypermetabolic left axillary lymph nodes. [place] showed a 3.1cm left breast mass at the 11 o'clock position, palpable on exam, and a single abnormal left axillary lymph node with cortical thickening. Left breast biopsy on 02/20/20 showed invasive and in situ carcinoma in the breast and axilla, grade 3, HER-2 positive (3+), ER/PR+ 95%, Ki67 75%   02/25/2020 Cancer Staging   Staging form: Breast, AJCC 8th Edition - Clinical stage from 02/25/2020: Stage IB (cT2, cN1, cM0, G3, ER+, PR+, HER2+) - Signed by GNicholas Lose MD on 02/25/2020   05/29/2020 - 05/29/2020 Chemotherapy   The patient had palonosetron (ALOXI) injection 0.25 mg, 0.25 mg, Intravenous,  Once, 1 of 3 cycles Administration: 0.25 mg (05/29/2020) pegfilgrastim-jmdb (FULPHILA) injection 6 mg, 6 mg, Subcutaneous,  Once, 1 of  3 cycles CARBOplatin (PARAPLATIN) 580 mg in sodium chloride 0.9 % 250 mL chemo infusion, 580 mg (100 % of original dose 580 mg), Intravenous,  Once, 1 of 3 cycles Dose modification:   (original dose 580 mg, Cycle 1) Administration: 580 mg (05/29/2020) DOCEtaxel (TAXOTERE)  120 mg in sodium chloride 0.9 % 250 mL chemo infusion, 60 mg/m2 = 120 mg (100 % of original dose 60 mg/m2), Intravenous,  Once, 1 of 3 cycles Dose modification: 60 mg/m2 (original dose 60 mg/m2, Cycle 1, Reason: Provider Judgment), 50 mg/m2 (original dose 60 mg/m2, Cycle 2, Reason: Dose not tolerated) Administration: 120 mg (05/29/2020) fosaprepitant (EMEND) 150 mg in sodium chloride 0.9 % 145 mL IVPB, 150 mg, Intravenous,  Once, 1 of 3 cycles Administration: 150 mg (05/29/2020) pertuzumab (PERJETA) 420 mg in sodium chloride 0.9 % 250 mL chemo infusion, 420 mg (50 % of original dose 840 mg), Intravenous, Once, 1 of 3 cycles Dose modification: 420 mg (original dose 840 mg, Cycle 1, Reason: Provider Judgment) Administration: 420 mg (05/29/2020) trastuzumab-dkst (OGIVRI) 525 mg in sodium chloride 0.9 % 250 mL chemo infusion, 6 mg/kg = 525 mg (75 % of original dose 8 mg/kg), Intravenous,  Once, 1 of 3 cycles Dose modification: 6 mg/kg (original dose 8 mg/kg, Cycle 1, Reason: Provider Judgment) Administration: 525 mg (05/29/2020)  for chemotherapy treatment.    06/22/2020 Genetic Testing   Positive genetic testing: Heterozygous pathogenic variant detected in BRIP1 gene at c.2010dup (p.Glu671*).  Variant of uncertain significance detected in POLD1 at c.532G>A (p.Gly178Arg).  No other pathogenic variants detected in [facility] Common Hereditary Cancers Panel.  The report date is June 22, 2020.   The Common Hereditary Cancers Panel offered by [facility] includes sequencing and/or deletion duplication testing of the following 48 genes: APC, ATM, AXIN2, BARD1, BMPR1A, BRCA1, BRCA2, BRIP1, CDH1, CDK4, CDKN2A (p14ARF), CDKN2A (p16INK4a), CHEK2, CTNNA1, DICER1, EPCAM (Deletion/duplication testing only), GREM1 (promoter region deletion/duplication testing only), KIT, MEN1, MLH1, MSH2, MSH3, MSH6, MUTYH, NBN, NF1, NHTL1, PALB2, PDGFRA, PMS2, POLD1, POLE, PTEN, RAD50, RAD51C, RAD51D, RNF43, SDHB, SDHC, SDHD, SMAD4,  SMARCA4. STK11, TP53, TSC1, TSC2, and VHL.  The following genes were evaluated for sequence changes only: SDHA and HOXB13 c.251G>A variant only.     CHIEF COMPLIANT: Follow-up of left breast and vulvar cancer  INTERVAL HISTORY: Sandra Miller is a 44 y.o. with above-mentioned history of left breast cancer and vulvar who completed neoadjuvant chemotherapy treatment with weekly TCH Perjeta, after Taxol and Carboplatin were discontinued due to nausea. She also has a history of DVT currently on Xarelto. She presents to the clinic today for follow-up.   She is feeling slightly better from nausea vomiting and diarrhea.  She continues to have nausea but she has not thrown up.  The diarrhea is also slowing down.  She was taking prednisone for colitis.  She is off prednisone at this time.  She has been eating and drinking well.  She is discussing with her dietitians about increasing her protein intake.  She has an appointment to see Dr. [name] to discuss surgical options. 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  ALLERGIES:  is allergic to dilaudid [hydromorphone hcl], depakote [divalproex sodium], minocycline, and aspirin.  MEDICATIONS:  Current Outpatient Medications  Medication Sig Dispense Refill  . acetaminophen (TYLENOL) 500 MG tablet Take 2 tablets (1,000 mg total) by mouth every 6 (six) hours as needed for moderate pain or fever. Avoid tylenol until your liver function back to  normal 30 tablet 0  . diphenoxylate-atropine (LOMOTIL) 2.5-0.025 MG tablet TAKE 1 TO 2 TABLETS BY MOUTH FOUR TIMES DAILY AS NEEDED FOR DIARRHEA (Patient taking differently: Take 1-2 tablets by mouth 4 (four) times daily as needed for diarrhea or loose stools. TAKE 1 TO 2 TABLETS BY MOUTH FOUR TIMES DAILY AS NEEDED FOR DIARRHEA) 45 tablet 1  . dronabinol (MARINOL) 2.5 MG capsule Take 1 capsule (2.5 mg total) by mouth 2 (two) times daily before a meal. (Patient not taking:  Reported on 06/17/2020) 60 capsule 0  . gabapentin (NEURONTIN) 300 MG capsule Take 1 capsule (300 mg total) by mouth daily. 90 capsule 3  . lidocaine-prilocaine (EMLA) cream Apply 1 application topically as needed (to port).    Marland Kitchen lisinopril (ZESTRIL) 20 MG tablet Take 1 tablet (20 mg total) by mouth daily. 30 tablet 0  . LORazepam (ATIVAN) 1 MG tablet Take 1 tablet (1 mg total) by mouth 3 (three) times daily as needed for anxiety. 90 tablet 2  . magnesium gluconate (MAGONATE) 30 MG tablet Take 1 tablet (30 mg total) by mouth at bedtime. 30 tablet 0  . nicotine (NICODERM CQ - DOSED IN MG/24 HOURS) 21 mg/24hr patch Place 1 patch (21 mg total) onto the skin daily. 28 patch 0  . OLANZapine (ZYPREXA) 10 MG tablet Take 1 tablet (10 mg total) by mouth at bedtime. 90 tablet 0  . omeprazole (PRILOSEC) 40 MG capsule TAKE 1 CAPSULE BY MOUTH TWO TIMES DAILY BEFORE A MEAL (Patient taking differently: Take 40 mg by mouth in the morning and at bedtime. TAKE 1 CAPSULE BY MOUTH TWO TIMES DAILY BEFORE A MEAL) 180 capsule 0  . potassium chloride SA (KLOR-CON) 20 MEQ tablet Take 1 tablet (20 mEq total) by mouth daily. 60 tablet 2  . prochlorperazine (COMPAZINE) 10 MG tablet TAKE 1 TABLET BY MOUTH EVERY 6 HOURS AS NEEDED FOR NAUSEA OR VOMITING (Patient taking differently: Take 10 mg by mouth every 6 (six) hours as needed for nausea or vomiting. ) 30 tablet 1  . promethazine (PHENERGAN) 25 MG tablet Take 1 tablet (25 mg total) by mouth every 6 (six) hours as needed for nausea. 30 tablet 3  . rivaroxaban (XARELTO) 20 MG TABS tablet Take 1 tablet (20 mg total) by mouth daily with supper. 30 tablet 0  . venlafaxine XR (EFFEXOR XR) 37.5 MG 24 hr capsule Take three capsule (112.5 mg total) by mouth daily. 90 capsule 2   No current facility-administered medications for this visit.   Facility-Administered Medications Ordered in Other Visits  Medication Dose Route Frequency Provider Last Rate Last Admin  . LORazepam (ATIVAN)  injection 0.5 mg  0.5 mg Intravenous Once Harle Stanford., PA-C        PHYSICAL EXAMINATION: ECOG PERFORMANCE STATUS: 1 - Symptomatic but completely ambulatory  Vitals:   06/26/20 1226  BP: 119/79  Pulse: (!) 118  Resp: 18  Temp: 97.9 F (36.6 C)  SpO2: 99%   Filed Weights   06/26/20 1226  Weight: 186 lb 11.2 oz (84.7 kg)    LABORATORY DATA:  I have reviewed the data as listed CMP Latest Ref Rng & Units 06/19/2020 06/18/2020 06/17/2020  Glucose 70 - 99 mg/dL 105(H) 118(H) 143(H)  BUN 6 - 20 mg/dL _0 Creatinine 0.44 - 1.00 mg/dL 0.55 0.66 0.49  Sodium 135 - 145 mmol/L 140 141 139  Potassium 3.5 - 5.1 mmol/L 4.3 3.6 2.6(LL)  Chloride 98 - 111 mmol/L 110 111 107  CO2 22 - 32 mmol/L _0 Calcium 8.9 - 10.3 mg/dL 9.0 8.6(L) 8.8(L)  Total Protein 6.5 - 8.1 g/dL 6.8 6.4(L) -  Total Bilirubin 0.3 - 1.2 mg/dL 0.4 0.5 -  Alkaline Phos 38 - 126 U/L 36(L) 34(L) -  AST 15 - 41 U/L 546(H) 904(H) -  ALT 0 - 44 U/L 181(H) 193(H) -    Lab Results  Component Value Date   WBC 7.2 06/26/2020   HGB 11.3 (L) 06/26/2020   HCT 34.7 (L) 06/26/2020   MCV 101.8 (H) 06/26/2020   PLT 281 06/26/2020   NEUTROABS 4.9 06/26/2020    ASSESSMENT & PLAN:  Malignant neoplasm of upper-inner quadrant of left breast in female, estrogen receptor positive ([place]) Breast cancer and vulvar cancers  02/20/2020:PET scan on 02/07/20 following a diagnosis of vulvar cancer showed a 1.7cm left breast mass and mildly hypermetabolic left axillary lymph nodes. Mammogram and [place] on 02/18/20 showed a 3.1cm left breast mass at the 11 o'clock position, palpable on exam, and a single abnormal left axillary lymph node with cortical thickening. Left breast biopsy on 02/20/20 showed invasive and in situ carcinoma in the breast and axilla, grade 3, HER-2 positive (3+), ER/PR+ 95%, Ki67 75%  Treatment plan: 1.Neoadjuvant chemoradiation for vulvar cancer with Taxol, carboplatin, Herceptin weekly x6  cycles 2.neoadjuvant chemotherapy with TCH Perjeta x3 cycles 3.Evaluation for breast conserving surgery if possible with sentinel lymph node study 3. Followed by adjuvant radiation therapy if patient had lumpectomy ------------------------------------------------------------------------------------------------------------------------------- Current treatment:Completed 6 cycles ofTaxol carboplatin Herceptin, weeklywith radiation Completed 1 cycle of [place] Perjeta  Echocardiogram: 02/23/2020: EF 55 to 60%  Hospitalization: 06/16/2020-06/19/2020: Intractable nausea vomiting and diarrhea (acute colitis), PE Current treatment: Xarelto for minimum of 1 year  Breast MRI 06/24/2020: No significant change in the left breast mass measuring 4 cm., Small satellite nodule not well seen. Left breast UOQ 1.2 cm mass(Previously biopsy proven to be benign: Remains suspicious)  Radiology review: I discussed the MRI images with the patient. Unfortunately it appears that she did not respond to treatment. Treatment plan: Proceed with surgical excision (she might need a mastectomy: We will discuss in the tumor board) Return to clinic after surgery to discuss her treatment plan.  BRI P-1 mutation: Patient wishes to undergo bilateral mastectomies because of this.  She is not interested in reconstruction.  She will also need an oophorectomy at a later time.  The risk of ovarian cancer is about 10 to 12%.  She will need further adjuvant therapy depending on the final pathology report.   No orders of the defined types were placed in this encounter.  The patient has a good understanding of the overall plan. she agrees with it. she will call with any problems that may develop before the next visit here.  Total time spent: 30 mins including face to face time and time spent for planning, charting and coordination of care  Nicholas Lose, MD 06/26/2020  I, Cloyde Reams Dorshimer, am acting as scribe for Dr. Nicholas Lose.  I have reviewed the above documentation for accuracy and completeness, and I agree with the above.

## 2020-06-26 ENCOUNTER — Inpatient Hospital Stay (HOSPITAL_BASED_OUTPATIENT_CLINIC_OR_DEPARTMENT_OTHER): Payer: Self-pay | Admitting: Hematology and Oncology

## 2020-06-26 ENCOUNTER — Inpatient Hospital Stay: Payer: Self-pay

## 2020-06-26 ENCOUNTER — Other Ambulatory Visit: Payer: Self-pay | Admitting: *Deleted

## 2020-06-26 ENCOUNTER — Encounter (HOSPITAL_COMMUNITY): Payer: Self-pay | Admitting: Psychiatry

## 2020-06-26 ENCOUNTER — Telehealth (INDEPENDENT_AMBULATORY_CARE_PROVIDER_SITE_OTHER): Payer: Self-pay | Admitting: Psychiatry

## 2020-06-26 ENCOUNTER — Other Ambulatory Visit: Payer: Self-pay

## 2020-06-26 DIAGNOSIS — Z170 Estrogen receptor positive status [ER+]: Secondary | ICD-10-CM

## 2020-06-26 DIAGNOSIS — C50212 Malignant neoplasm of upper-inner quadrant of left female breast: Secondary | ICD-10-CM

## 2020-06-26 DIAGNOSIS — F411 Generalized anxiety disorder: Secondary | ICD-10-CM

## 2020-06-26 DIAGNOSIS — F319 Bipolar disorder, unspecified: Secondary | ICD-10-CM

## 2020-06-26 LAB — CBC WITH DIFFERENTIAL (CANCER CENTER ONLY)
Abs Immature Granulocytes: 0.04 10*3/uL (ref 0.00–0.07)
Basophils Absolute: 0 10*3/uL (ref 0.0–0.1)
Basophils Relative: 0 %
Eosinophils Absolute: 0 10*3/uL (ref 0.0–0.5)
Eosinophils Relative: 0 %
HCT: 34.7 % — ABNORMAL LOW (ref 36.0–46.0)
Hemoglobin: 11.3 g/dL — ABNORMAL LOW (ref 12.0–15.0)
Immature Granulocytes: 1 %
Lymphocytes Relative: 19 %
Lymphs Abs: 1.4 10*3/uL (ref 0.7–4.0)
MCH: 33.1 pg (ref 26.0–34.0)
MCHC: 32.6 g/dL (ref 30.0–36.0)
MCV: 101.8 fL — ABNORMAL HIGH (ref 80.0–100.0)
Monocytes Absolute: 0.8 10*3/uL (ref 0.1–1.0)
Monocytes Relative: 11 %
Neutro Abs: 4.9 10*3/uL (ref 1.7–7.7)
Neutrophils Relative %: 69 %
Platelet Count: 281 10*3/uL (ref 150–400)
RBC: 3.41 MIL/uL — ABNORMAL LOW (ref 3.87–5.11)
RDW: 15.7 % — ABNORMAL HIGH (ref 11.5–15.5)
WBC Count: 7.2 10*3/uL (ref 4.0–10.5)
nRBC: 0 % (ref 0.0–0.2)

## 2020-06-26 LAB — CMP (CANCER CENTER ONLY)
ALT: 73 U/L — ABNORMAL HIGH (ref 0–44)
AST: 33 U/L (ref 15–41)
Albumin: 3.8 g/dL (ref 3.5–5.0)
Alkaline Phosphatase: 58 U/L (ref 38–126)
Anion gap: 5 (ref 5–15)
BUN: 16 mg/dL (ref 6–20)
CO2: 25 mmol/L (ref 22–32)
Calcium: 9.5 mg/dL (ref 8.9–10.3)
Chloride: 107 mmol/L (ref 98–111)
Creatinine: 0.71 mg/dL (ref 0.44–1.00)
GFR, Estimated: >60 mL/min (ref >60)
Glucose, Bld: 98 mg/dL (ref 70–99)
Potassium: 3.9 mmol/L (ref 3.5–5.1)
Sodium: 137 mmol/L (ref 135–145)
Total Bilirubin: 0.3 mg/dL (ref 0.3–1.2)
Total Protein: 7.1 g/dL (ref 6.5–8.1)

## 2020-06-26 LAB — PROTIME-INR
INR: 1 (ref 0.8–1.2)
Prothrombin Time: 13 seconds (ref 11.4–15.2)

## 2020-06-26 LAB — MAGNESIUM: Magnesium: 0.7 mg/dL — CL (ref 1.7–2.4)

## 2020-06-26 MED ORDER — VENLAFAXINE HCL ER 37.5 MG PO CP24: ORAL_CAPSULE | ORAL | 2 refills | Status: DC

## 2020-06-26 MED ORDER — MAGNESIUM GLUCONATE 30 MG PO TABS
30.0000 mg | ORAL_TABLET | Freq: Every day | ORAL | 0 refills | Status: DC
Start: 2020-06-26 — End: 2020-06-26

## 2020-06-26 MED ORDER — LORAZEPAM 1 MG PO TABS: 1.0000 mg | ORAL_TABLET | Freq: Three times a day (TID) | ORAL | 2 refills | Status: DC | PRN

## 2020-06-26 MED ORDER — MAGNESIUM OXIDE 400 (241.3 MG) MG PO TABS: 400.0000 mg | ORAL_TABLET | Freq: Every day | ORAL | 1 refills | Status: DC

## 2020-06-26 MED ORDER — OLANZAPINE 10 MG PO TABS: 10.0000 mg | ORAL_TABLET | Freq: Every day | ORAL | 0 refills | Status: DC

## 2020-06-26 MED FILL — OLANZapine 10 MG TABS: 10 | 90 days supply | Qty: 90 | Fill #0

## 2020-06-26 MED FILL — MAGNESIUM OXIDE 400 MG TAB: 400 (240 MG | 60 days supply | Qty: 60 | Fill #0

## 2020-06-26 NOTE — Progress Notes (Signed)
CRITICAL VALUE ALERT  Critical Value:  Mag 0.7  Date & Time Notied:  06/26/20 at 1300  Provider Notified: Nicholas Lose, MD  Orders Received/Actions taken: MD notified, orders placed for Magnesium oxide 400 mg p.o daily.  Prescription sent to pharmacy on file.  Pt notified and verbalized understanding.

## 2020-06-26 NOTE — Assessment & Plan Note (Signed)
Breast cancer and vulvar cancers  02/20/2020:PET scan on 02/07/20 following a diagnosis of vulvar cancer showed a 1.7cm left breast mass and mildly hypermetabolic left axillary lymph nodes. Mammogram and [place] on 02/18/20 showed a 3.1cm left breast mass at the 11 o'clock position, palpable on exam, and a single abnormal left axillary lymph node with cortical thickening. Left breast biopsy on 02/20/20 showed invasive and in situ carcinoma in the breast and axilla, grade 3, HER-2 positive (3+), ER/PR+ 95%, Ki67 75%  Treatment plan: 1.Neoadjuvant chemoradiation for vulvar cancer with Taxol, carboplatin, Herceptin weekly x6 cycles 2.neoadjuvant chemotherapy with TCH Perjeta x3 cycles 3.Evaluation for breast conserving surgery if possible with sentinel lymph node study 3. Followed by adjuvant radiation therapy if patient had lumpectomy ------------------------------------------------------------------------------------------------------------------------------- Current treatment:Completed 6 cycles ofTaxol carboplatin Herceptin, weeklywith radiation Completed 1 cycle of TCH Perjeta  Echocardiogram: 02/23/2020: EF 55 to 60%  Hospitalization: 06/16/2020-06/19/2020: Intractable nausea vomiting and diarrhea (acute colitis), PE Current treatment: Xarelto (because of lack of insurance we will need to switch her to Coumadin)  Breast MRI 06/24/2020: No significant change in the left breast mass measuring 4 cm., Small satellite nodule not well seen. Left breast UOQ 1.2 cm mass(Previously biopsy proven to be benign: Remains suspicious)  Radiology review: I discussed the MRI images with the patient. Unfortunately it appears that she did not respond to treatment. Treatment plan: Proceed with surgical excision (she might need a mastectomy: We will discuss in the tumor board) Return to clinic after surgery to discuss her treatment plan.  She will need further adjuvant therapy depending on the final pathology  report.

## 2020-06-26 NOTE — Progress Notes (Signed)
Virtual Visit via Telephone Note  I connected with Sandra Miller on 06/26/20 at  8:20 AM EDT by telephone and verified that I am speaking with the correct person using two identifiers.  Location: Patient: home Provider: home friday   I discussed the limitations, risks, security and privacy concerns of performing an evaluation and management service by telephone and the availability of in person appointments. I also discussed with the patient that there may be a patient responsible charge related to this service. The patient expressed understanding and agreed to proceed.   History of Present Illness: Patient is evaluated by phone session.  She is under a lot of stress as lately she was admitted in the hospital for nausea and she had blood work.  Her liver enzymes were very high.  She received a call yesterday that they may not continue chemotherapy because of her physical condition and she may require surgery or immunotherapy.  She has breast cancer and she was getting chemotherapy.  She finished treatment for her vulvar cancer and she has follow-up appointment to see if needs some more treatment.  She endorsed a lot of fatigue, tired, anxious and sometimes depressed.  However she has a supportive family and her mother lives with her and usually take her to the doctor's appointment.  Her siblings and sister are also very helpful.  She endorsed on time have difficulty do her ADLs and she is sad because she cannot go around and not able to work.  But she denies any mania, anger, agitation.  Her sleep is sporadic and some nights she has difficulty sleeping and other days she sleeps too much.  She feels it is because of tired feeling as she complained fatigue and have no energy because of chemotherapy.  She denies any hallucination, paranoia or any suicidal or homicidal thoughts.  She lost weight from the past.    Past Psychiatric History: H/Obipolar disorder, alcohol dependence and marijuana abuse.  H/Oat least 6inpatient.Triedlithium, Seroquel, Lamictal, Paxil, Celexa, Abilify, Geodon,Klonopin,Depakote, Neurontin and Cymbalta. No H/ohistory of suicidal attempt.   Recent Results (from the past 2160 hour(s))  CMP ([place] only)     Status: Abnormal   Collection Time: 03/31/20 11:54 AM  Result Value Ref Range   Sodium 138 135 - 145 mmol/L   Potassium 3.4 (L) 3.5 - 5.1 mmol/L   Chloride 107 98 - 111 mmol/L   CO2 22 22 - 32 mmol/L   Glucose, Bld 95 70 - 99 mg/dL    Comment: Glucose reference range applies only to samples taken after fasting for at least 8 hours.   BUN 11 6 - 20 mg/dL   Creatinine 0.72 0.44 - 1.00 mg/dL   Calcium 10.3 8.9 - 10.3 mg/dL   Total Protein 7.3 6.5 - 8.1 g/dL   Albumin 3.9 3.5 - 5.0 g/dL   AST 28 15 - 41 U/L   ALT 51 (H) 0 - 44 U/L   Alkaline Phosphatase 54 38 - 126 U/L   Total Bilirubin 0.3 0.3 - 1.2 mg/dL   GFR, Estimated >60 >60 mL/min   GFR, Est AFR Am >60 >60 mL/min   Anion gap 9 5 - 15    Comment: Performed at [facility], [place] [address], [place], [place] [address]  CBC with Differential ([facility] Only)     Status: Abnormal   Collection Time: 03/31/20 11:54 AM  Result Value Ref Range   WBC Count 3.5 (L) 4.0 - 10.5 K/uL  RBC 3.87 3.87 - 5.11 MIL/uL   Hemoglobin 11.3 (L) 12.0 - 15.0 g/dL   HCT 33.2 (L) 36 - 46 %   MCV 85.8 80.0 - 100.0 fL   MCH 29.2 26.0 - 34.0 pg   MCHC 34.0 30.0 - 36.0 g/dL   RDW 14.1 11.5 - 15.5 %   Platelet Count 167 150 - 400 K/uL   nRBC 0.0 0.0 - 0.2 %   Neutrophils Relative % 65 %   Neutro Abs 2.3 1.7 - 7.7 K/uL   Lymphocytes Relative 27 %   Lymphs Abs 0.9 0.7 - 4.0 K/uL   Monocytes Relative 6 %   Monocytes Absolute 0.2 0.1 - 1.0 K/uL   Eosinophils Relative 0 %   Eosinophils Absolute 0.0 0.0 - 0.5 K/uL   Basophils Relative 1 %   Basophils Absolute 0.0 0.0 - 0.1 K/uL   Immature Granulocytes 1 %   Abs Immature Granulocytes 0.04 0.00 - 0.07 K/uL    Comment: Performed  at [facility], [place] [address] [place], [place] [address]  CMP ([place] only)     Status: Abnormal   Collection Time: 04/07/20 12:29 PM  Result Value Ref Range   Sodium 138 135 - 145 mmol/L   Potassium 3.5 3.5 - 5.1 mmol/L   Chloride 110 98 - 111 mmol/L   CO2 20 (L) 22 - 32 mmol/L   Glucose, Bld 101 (H) 70 - 99 mg/dL    Comment: Glucose reference range applies only to samples taken after fasting for at least 8 hours.   BUN 10 6 - 20 mg/dL   Creatinine 0.72 0.44 - 1.00 mg/dL   Calcium 9.2 8.9 - 10.3 mg/dL   Total Protein 6.9 6.5 - 8.1 g/dL   Albumin 3.7 3.5 - 5.0 g/dL   AST 27 15 - 41 U/L   ALT 45 (H) 0 - 44 U/L   Alkaline Phosphatase 55 38 - 126 U/L   Total Bilirubin 0.3 0.3 - 1.2 mg/dL   GFR, Estimated >60 >60 mL/min   GFR, Est AFR Am >60 >60 mL/min   Anion gap 8 5 - 15    Comment: Performed at [facility], [place] [address] [place], [place] [address]  CBC with Differential ([place] Only)     Status: Abnormal   Collection Time: 04/07/20 12:29 PM  Result Value Ref Range   WBC Count 4.2 4.0 - 10.5 K/uL   RBC 3.46 (L) 3.87 - 5.11 MIL/uL   Hemoglobin 10.3 (L) 12.0 - 15.0 g/dL   HCT 29.7 (L) 36 - 46 %   MCV 85.8 80.0 - 100.0 fL   MCH 29.8 26.0 - 34.0 pg   MCHC 34.7 30.0 - 36.0 g/dL   RDW 14.6 11.5 - 15.5 %   Platelet Count 138 (L) 150 - 400 K/uL   nRBC 0.5 (H) 0.0 - 0.2 %   Neutrophils Relative % 72 %   Neutro Abs 3.1 1.7 - 7.7 K/uL   Lymphocytes Relative 20 %   Lymphs Abs 0.8 0.7 - 4.0 K/uL   Monocytes Relative 6 %   Monocytes Absolute 0.3 0.1 - 1.0 K/uL   Eosinophils Relative 0 %   Eosinophils Absolute 0.0 0.0 - 0.5 K/uL   Basophils Relative 1 %   Basophils Absolute 0.0 0.0 - 0.1 K/uL   Immature Granulocytes 1 %   Abs Immature Granulocytes 0.02 0.00 - 0.07 K/uL    Comment: Performed at [facility], [place] [address], [place], [place] [address]  CBC with Differential     Status:  Abnormal   Collection Time: 04/11/20  3:21 AM  Result Value Ref Range   WBC 3.5 (L) 4.0 - 10.5 K/uL   RBC 3.77 (L) 3.87 - 5.11 MIL/uL   Hemoglobin 11.3 (L) 12.0 - 15.0 g/dL   HCT 32.4 (L) 36 - 46 %   MCV 85.9 80.0 - 100.0 fL   MCH 30.0 26.0 - 34.0 pg   MCHC 34.9 30.0 - 36.0 g/dL   RDW 14.7 11.5 - 15.5 %   Platelets 127 (L) 150 - 400 K/uL   nRBC 0.0 0.0 - 0.2 %   Neutrophils Relative % 76 %   Neutro Abs 2.7 1.7 - 7.7 K/uL   Lymphocytes Relative 17 %   Lymphs Abs 0.6 (L) 0.7 - 4.0 K/uL   Monocytes Relative 6 %   Monocytes Absolute 0.2 0.1 - 1.0 K/uL   Eosinophils Relative 0 %   Eosinophils Absolute 0.0 0.0 - 0.5 K/uL   Basophils Relative 0 %   Basophils Absolute 0.0 0.0 - 0.1 K/uL   Immature Granulocytes 1 %   Abs Immature Granulocytes 0.02 0.00 - 0.07 K/uL    Comment: Performed at [facility], [place] [address] [place], [place] [address]  Comprehensive metabolic panel     Status: Abnormal   Collection Time: 04/11/20  3:21 AM  Result Value Ref Range   Sodium 133 (L) 135 - 145 mmol/L   Potassium 2.8 (L) 3.5 - 5.1 mmol/L   Chloride 98 98 - 111 mmol/L   CO2 23 22 - 32 mmol/L   Glucose, Bld 127 (H) 70 - 99 mg/dL    Comment: Glucose reference range applies only to samples taken after fasting for at least 8 hours.   BUN 7 6 - 20 mg/dL   Creatinine, Ser 0.58 0.44 - 1.00 mg/dL   Calcium 8.9 8.9 - 10.3 mg/dL   Total Protein 7.3 6.5 - 8.1 g/dL   Albumin 4.0 3.5 - 5.0 g/dL   AST 28 15 - 41 U/L   ALT 46 (H) 0 - 44 U/L   Alkaline Phosphatase 55 38 - 126 U/L   Total Bilirubin 1.0 0.3 - 1.2 mg/dL   GFR calc non Af Amer >60 >60 mL/min   GFR calc Af Amer >60 >60 mL/min   Anion gap 12 5 - 15    Comment: Performed at [facility], [place] [address], [place], [place] [address]  Lactic acid, plasma     Status: None   Collection Time: 04/11/20  3:21 AM  Result Value Ref Range   Lactic Acid, Venous 1.0 0.5 - 1.9 mmol/L    Comment: Performed at [facility], [place] [address] [place], [place] [address]  Blood culture (routine x 2)     Status: None   Collection Time: 04/11/20  3:21 AM   Specimen: BLOOD  Result Value Ref Range   Specimen Description      BLOOD RIGHT HAND Performed at [place] [address], [place], [place] [address]    Special Requests      BOTTLES DRAWN AEROBIC AND ANAEROBIC Blood Culture adequate volume Performed at [place] [address], [place], [place] [address]    Culture      NO GROWTH 5 DAYS Performed at [facility], [place] [address], [place], [place] [address]  Report Status 04/16/2020 FINAL   SARS Coronavirus 2 by RT PCR (hospital order, performed in [facility] hospital lab) Nasopharyngeal Nasopharyngeal Swab     Status: None   Collection Time: 04/11/20  3:21 AM   Specimen: Nasopharyngeal Swab  Result Value Ref Range   SARS Coronavirus 2 NEGATIVE NEGATIVE    Comment: (NOTE) SARS-CoV-2 target nucleic acids are NOT DETECTED.  The SARS-CoV-2 RNA is generally detectable in upper and lower respiratory specimens during the acute phase of infection. The lowest concentration of SARS-CoV-2 viral copies this assay can detect is 250 copies / mL. A negative result does not preclude SARS-CoV-2 infection and should not be used as the sole basis for treatment or other patient management decisions.  A negative result may occur with improper specimen collection / handling, submission of specimen other than nasopharyngeal swab, presence of viral mutation(s) within the areas targeted by this assay, and inadequate number of viral copies (<250 copies / mL). A negative result must be combined with clinical observations, patient history, and epidemiological information.  Fact Sheet for Patients:   StrictlyIdeas.no  Fact Sheet for Healthcare Providers: BankingDealers.co.za  This test is not yet approved  or  cleared by the [place] FDA and has been authorized for detection and/or diagnosis of SARS-CoV-2 by FDA under an Emergency Use Authorization (EUA).  This EUA will remain in effect (meaning this test can be used) for the duration of the COVID-19 declaration under Section 564(b)(1) of the Act, 21 U.S.C. section 360bbb-3(b)(1), unless the authorization is terminated or revoked sooner.  Performed at [facility], [place] [address] [place], [place] [address]   Blood culture (routine x 2)     Status: None   Collection Time: 04/11/20  3:26 AM   Specimen: BLOOD  Result Value Ref Range   Specimen Description      BLOOD LEFT HAND Performed at [place] [address], [place], [place] [address]    Special Requests      BOTTLES DRAWN AEROBIC AND ANAEROBIC Blood Culture adequate volume Performed at [place] [address] [place], [place] [address]    Culture      NO GROWTH 5 DAYS Performed at [facility], [place] [address], [place], [place] [address]    Report Status 04/16/2020 FINAL   Group A Strep by PCR     Status: None   Collection Time: 04/11/20  5:14 AM   Specimen: Throat; Sterile Swab  Result Value Ref Range   Group A Strep by PCR NOT DETECTED NOT DETECTED    Comment: Performed at [facility], [place] [address], [place], [place] [address]  Urinalysis, Routine w reflex microscopic     Status: Abnormal   Collection Time: 04/11/20  8:34 AM  Result Value Ref Range   Color, Urine YELLOW YELLOW   APPearance CLEAR CLEAR   Specific Gravity, Urine 1.033 (H) 1.005 - 1.030   pH 5.0 5.0 - 8.0   Glucose, UA NEGATIVE NEGATIVE mg/dL   Hgb urine dipstick SMALL (A) NEGATIVE   Bilirubin Urine NEGATIVE NEGATIVE   Ketones, ur NEGATIVE NEGATIVE mg/dL   Protein, ur 30 (A) NEGATIVE mg/dL   Nitrite NEGATIVE NEGATIVE   Leukocytes,Ua MODERATE (A) NEGATIVE   RBC / HPF 6-10 0 - 5 RBC/hpf   WBC, UA 21-50 0  - 5 WBC/hpf   Bacteria, UA RARE (A) NONE SEEN   Squamous Epithelial / LPF 0-5 0 - 5  Mucus PRESENT    Hyaline Casts, UA PRESENT     Comment: Performed at [facility], [place] [address] [place], [place] [address]  Urine culture     Status: Abnormal   Collection Time: 04/11/20  8:34 AM   Specimen: Urine, Random  Result Value Ref Range   Specimen Description      URINE, RANDOM Performed at [place] [address], [place], [place] [address]    Special Requests      NONE Performed at [facility], [place] [address], [place], [place] [address]    Culture (A)     20,000 COLONIES/mL GROUP B STREP(S.AGALACTIAE)ISOLATED TESTING AGAINST S. AGALACTIAE NOT ROUTINELY PERFORMED DUE TO PREDICTABILITY OF AMP/PEN/VAN SUSCEPTIBILITY. Performed at [facility], [place] [address], [place], [place] [address]    Report Status 04/12/2020 FINAL   CMP ([place] only)     Status: Abnormal   Collection Time: 04/14/20  8:45 AM  Result Value Ref Range   Sodium 136 135 - 145 mmol/L   Potassium 2.6 (LL) 3.5 - 5.1 mmol/L    Comment: CRITICAL RESULT CALLED TO, READ BACK BY AND VERIFIED WITH: TAMMI HOLLAND, RN AT 0926 BY P.SUTCAVAGE    Chloride 102 98 - 111 mmol/L   CO2 22 22 - 32 mmol/L   Glucose, Bld 106 (H) 70 - 99 mg/dL    Comment: Glucose reference range applies only to samples taken after fasting for at least 8 hours.   BUN 7 6 - 20 mg/dL   Creatinine 0.73 0.44 - 1.00 mg/dL   Calcium 9.1 8.9 - 10.3 mg/dL   Total Protein 7.3 6.5 - 8.1 g/dL   Albumin 3.4 (L) 3.5 - 5.0 g/dL   AST 23 15 - 41 U/L   ALT 40 0 - 44 U/L   Alkaline Phosphatase 55 38 - 126 U/L   Total Bilirubin 0.3 0.3 - 1.2 mg/dL   GFR, Estimated >60 >60 mL/min   GFR, Est AFR Am >60 >60 mL/min   Anion gap 12 5 - 15    Comment: Performed at [facility], [place] [address], [place], [place] [address]  CBC with Differential ([place] Only)     Status:  Abnormal   Collection Time: 04/14/20  8:45 AM  Result Value Ref Range   WBC Count 3.8 (L) 4.0 - 10.5 K/uL   RBC 3.25 (L) 3.87 - 5.11 MIL/uL   Hemoglobin 9.7 (L) 12.0 - 15.0 g/dL   HCT 27.5 (L) 36 - 46 %   MCV 84.6 80.0 - 100.0 fL   MCH 29.8 26.0 - 34.0 pg   MCHC 35.3 30.0 - 36.0 g/dL   RDW 14.9 11.5 - 15.5 %   Platelet Count 126 (L) 150 - 400 K/uL   nRBC 0.0 0.0 - 0.2 %   Neutrophils Relative % 62 %   Neutro Abs 2.4 1.7 - 7.7 K/uL   Lymphocytes Relative 30 %   Lymphs Abs 1.1 0.7 - 4.0 K/uL   Monocytes Relative 6 %   Monocytes Absolute 0.2 0.1 - 1.0 K/uL   Eosinophils Relative 0 %   Eosinophils Absolute 0.0 0.0 - 0.5 K/uL   Basophils Relative 1 %   Basophils Absolute 0.0 0.0 - 0.1 K/uL   WBC Morphology MORPHOLOGY UNREMARKABLE    Smear Review PLATELET COUNT CONFIRMED BY SMEAR    Immature Granulocytes 1 %   Abs Immature Granulocytes 0.02 0.00 - 0.07 K/uL  Comment: Performed at [facility], [place] [address], [place], [place] [address]  CMP ([place] only)     Status: Abnormal   Collection Time: 04/21/20  9:36 AM  Result Value Ref Range   Sodium 137 135 - 145 mmol/L   Potassium 3.1 (L) 3.5 - 5.1 mmol/L   Chloride 103 98 - 111 mmol/L   CO2 24 22 - 32 mmol/L   Glucose, Bld 115 (H) 70 - 99 mg/dL    Comment: Glucose reference range applies only to samples taken after fasting for at least 8 hours.   BUN 8 6 - 20 mg/dL   Creatinine 0.75 0.44 - 1.00 mg/dL   Calcium 10.4 (H) 8.9 - 10.3 mg/dL   Total Protein 8.0 6.5 - 8.1 g/dL   Albumin 3.5 3.5 - 5.0 g/dL   AST 20 15 - 41 U/L   ALT 31 0 - 44 U/L   Alkaline Phosphatase 58 38 - 126 U/L   Total Bilirubin 0.4 0.3 - 1.2 mg/dL   GFR, Estimated >60 >60 mL/min   GFR, Est AFR Am >60 >60 mL/min   Anion gap 10 5 - 15    Comment: Performed at [facility], [place] [address] [place], [place] [address]  CBC with Differential ([place] Only)     Status: Abnormal   Collection Time:  04/21/20  9:36 AM  Result Value Ref Range   WBC Count 3.5 (L) 4.0 - 10.5 K/uL   RBC 3.15 (L) 3.87 - 5.11 MIL/uL   Hemoglobin 9.5 (L) 12.0 - 15.0 g/dL   HCT 27.5 (L) 36 - 46 %   MCV 87.3 80.0 - 100.0 fL   MCH 30.2 26.0 - 34.0 pg   MCHC 34.5 30.0 - 36.0 g/dL   RDW 15.6 (H) 11.5 - 15.5 %   Platelet Count 189 150 - 400 K/uL   nRBC 0.0 0.0 - 0.2 %   Neutrophils Relative % 66 %   Neutro Abs 2.3 1.7 - 7.7 K/uL   Lymphocytes Relative 23 %   Lymphs Abs 0.8 0.7 - 4.0 K/uL   Monocytes Relative 10 %   Monocytes Absolute 0.4 0.1 - 1.0 K/uL   Eosinophils Relative 0 %   Eosinophils Absolute 0.0 0.0 - 0.5 K/uL   Basophils Relative 0 %   Basophils Absolute 0.0 0.0 - 0.1 K/uL   Immature Granulocytes 1 %   Abs Immature Granulocytes 0.02 0.00 - 0.07 K/uL    Comment: Performed at [facility], [place] [address] [place], [place] [address]  Glucose, capillary     Status: Abnormal   Collection Time: 05/05/20  3:15 PM  Result Value Ref Range   Glucose-Capillary 120 (H) 70 - 99 mg/dL    Comment: Performed at [facility], [place] [address] [place], [place] [address]  CBC with Differential ([place] Only)     Status: Abnormal   Collection Time: 05/26/20  9:48 AM  Result Value Ref Range   WBC Count 6.9 4.0 - 10.5 K/uL   RBC 3.21 (L) 3.87 - 5.11 MIL/uL   Hemoglobin 10.0 (L) 12.0 - 15.0 g/dL   HCT 30.8 (L) 36 - 46 %   MCV 96.0 80.0 - 100.0 fL   MCH 31.2 26.0 - 34.0 pg   MCHC 32.5 30.0 - 36.0 g/dL   RDW 19.4 (H) 11.5 - 15.5 %   Platelet Count 343 150 - 400 K/uL   nRBC 0.0  0.0 - 0.2 %   Neutrophils Relative % 74 %   Neutro Abs 5.1 1.7 - 7.7 K/uL   Lymphocytes Relative 17 %   Lymphs Abs 1.2 0.7 - 4.0 K/uL   Monocytes Relative 8 %   Monocytes Absolute 0.5 0.1 - 1.0 K/uL   Eosinophils Relative 0 %   Eosinophils Absolute 0.0 0.0 - 0.5 K/uL   Basophils Relative 0 %   Basophils Absolute 0.0 0.0 - 0.1 K/uL   Immature Granulocytes 1 %   Abs Immature  Granulocytes 0.05 0.00 - 0.07 K/uL    Comment: Performed at [facility], [place] [address], [place], [place] [address]  CMP ([place] only)     Status: Abnormal   Collection Time: 05/26/20  9:48 AM  Result Value Ref Range   Sodium 138 135 - 145 mmol/L   Potassium 3.3 (L) 3.5 - 5.1 mmol/L   Chloride 105 98 - 111 mmol/L   CO2 24 22 - 32 mmol/L   Glucose, Bld 106 (H) 70 - 99 mg/dL    Comment: Glucose reference range applies only to samples taken after fasting for at least 8 hours.   BUN 10 6 - 20 mg/dL   Creatinine 0.73 0.44 - 1.00 mg/dL   Calcium 10.3 8.9 - 10.3 mg/dL   Total Protein 7.7 6.5 - 8.1 g/dL   Albumin 3.7 3.5 - 5.0 g/dL   AST 19 15 - 41 U/L   ALT 20 0 - 44 U/L   Alkaline Phosphatase 56 38 - 126 U/L   Total Bilirubin 0.5 0.3 - 1.2 mg/dL   GFR, Estimated >60 >60 mL/min   GFR, Est AFR Am >60 >60 mL/min   Anion gap 9 5 - 15    Comment: Performed at [facility], [place] [address] [place], [place] [address]  CBC with Differential ([place] Only)     Status: Abnormal   Collection Time: 06/04/20  2:08 PM  Result Value Ref Range   WBC Count 6.1 4.0 - 10.5 K/uL   RBC 3.54 (L) 3.87 - 5.11 MIL/uL   Hemoglobin 11.3 (L) 12.0 - 15.0 g/dL   HCT 33.5 (L) 36 - 46 %   MCV 94.6 80.0 - 100.0 fL   MCH 31.9 26.0 - 34.0 pg   MCHC 33.7 30.0 - 36.0 g/dL   RDW 15.7 (H) 11.5 - 15.5 %   Platelet Count 261 150 - 400 K/uL   nRBC 0.0 0.0 - 0.2 %   Neutrophils Relative % 75 %   Neutro Abs 4.7 1.7 - 7.7 K/uL   Lymphocytes Relative 19 %   Lymphs Abs 1.2 0.7 - 4.0 K/uL   Monocytes Relative 2 %   Monocytes Absolute 0.1 0.1 - 1.0 K/uL   Eosinophils Relative 0 %   Eosinophils Absolute 0.0 0.0 - 0.5 K/uL   Basophils Relative 2 %   Basophils Absolute 0.1 0.0 - 0.1 K/uL   WBC Morphology VARIANT LYMPHS    Immature Granulocytes 2 %   Abs Immature Granulocytes 0.10 (H) 0.00 - 0.07 K/uL    Comment: Performed at [facility], [address], [place], [place] [address]  CMP ([place] only)     Status: Abnormal   Collection Time: 06/04/20  2:08 PM  Result Value Ref Range   Sodium 134 (L) 135 - 145 mmol/L   Potassium 3.1 (L) 3.5 - 5.1 mmol/L   Chloride 99 98 - 111 mmol/L  CO2 22 22 - 32 mmol/L   Glucose, Bld 124 (H) 70 - 99 mg/dL    Comment: Glucose reference range applies only to samples taken after fasting for at least 8 hours.   BUN 10 6 - 20 mg/dL   Creatinine 0.76 0.44 - 1.00 mg/dL   Calcium 9.7 8.9 - 10.3 mg/dL   Total Protein 8.4 (H) 6.5 - 8.1 g/dL   Albumin 4.2 3.5 - 5.0 g/dL   AST 37 15 - 41 U/L   ALT 46 (H) 0 - 44 U/L   Alkaline Phosphatase 59 38 - 126 U/L   Total Bilirubin 0.9 0.3 - 1.2 mg/dL   GFR, Estimated >60 >60 mL/min   GFR, Est AFR Am >60 >60 mL/min   Anion gap 13 5 - 15    Comment: Performed at [facility], [place] [address], [place], [place] [address]  Genetic Screening Order     Status: None   Collection Time: 06/04/20  2:08 PM  Result Value Ref Range   Genetic Screening Order Collected by Laboratory     Comment: Performed at [facility], [place] [address], [place], [place] [address]  CBC with Differential     Status: Abnormal   Collection Time: 06/16/20  7:49 PM  Result Value Ref Range   WBC 19.4 (H) 4.0 - 10.5 K/uL   RBC 3.64 (L) 3.87 - 5.11 MIL/uL   Hemoglobin 11.9 (L) 12.0 - 15.0 g/dL   HCT 35.4 (L) 36 - 46 %   MCV 97.3 80.0 - 100.0 fL   MCH 32.7 26.0 - 34.0 pg   MCHC 33.6 30.0 - 36.0 g/dL   RDW 15.8 (H) 11.5 - 15.5 %   Platelets 317 150 - 400 K/uL   nRBC 0.0 0.0 - 0.2 %   Neutrophils Relative % 88 %   Neutro Abs 17.1 (H) 1.7 - 7.7 K/uL   Lymphocytes Relative 6 %   Lymphs Abs 1.1 0.7 - 4.0 K/uL   Monocytes Relative 5 %   Monocytes Absolute 1.0 0.1 - 1.0 K/uL   Eosinophils Relative 0 %   Eosinophils Absolute 0.0 0.0 - 0.5 K/uL   Basophils Relative 0 %   Basophils Absolute 0.0 0.0 - 0.1 K/uL   Immature  Granulocytes 1 %   Abs Immature Granulocytes 0.27 (H) 0.00 - 0.07 K/uL    Comment: Performed at [facility], [place] [address] [place], [place] [address]  I-Stat beta hCG blood, ED     Status: None   Collection Time: 06/16/20  8:06 PM  Result Value Ref Range   I-stat hCG, quantitative <5.0 <5 mIU/mL   Comment 3            Comment:   GEST. AGE      CONC.  (mIU/mL)   <=1 WEEK        5 - 50     2 WEEKS       50 - 500     3 WEEKS       100 - 10,000     4 WEEKS     1,000 - 30,000        FEMALE AND NON-PREGNANT FEMALE:     LESS THAN 5 mIU/mL   Comprehensive metabolic panel     Status: Abnormal   Collection Time: 06/16/20  8:47 PM  Result Value Ref Range   Sodium 138 135 - 145 mmol/L   Potassium 2.9 (L) 3.5 -  5.1 mmol/L   Chloride 102 98 - 111 mmol/L   CO2 23 22 - 32 mmol/L   Glucose, Bld 169 (H) 70 - 99 mg/dL    Comment: Glucose reference range applies only to samples taken after fasting for at least 8 hours.   BUN 12 6 - 20 mg/dL   Creatinine, Ser 0.83 0.44 - 1.00 mg/dL   Calcium 9.5 8.9 - 10.3 mg/dL   Total Protein 7.2 6.5 - 8.1 g/dL   Albumin 3.9 3.5 - 5.0 g/dL   AST 65 (H) 15 - 41 U/L   ALT 26 0 - 44 U/L   Alkaline Phosphatase 38 38 - 126 U/L   Total Bilirubin 0.9 0.3 - 1.2 mg/dL   GFR, Estimated >60 >60 mL/min   Anion gap 13 5 - 15    Comment: Performed at [facility], [place] [address] [place], [place] [address]  Lipase, blood     Status: None   Collection Time: 06/16/20  8:47 PM  Result Value Ref Range   Lipase 32 11 - 51 U/L    Comment: Performed at [facility], [place] [address], [place], [place] [address]  Respiratory Panel by RT PCR (Flu A&B, Covid) - Nasopharyngeal Swab     Status: None   Collection Time: 06/16/20 10:38 PM   Specimen: Nasopharyngeal Swab  Result Value Ref Range   SARS Coronavirus 2 by RT PCR NEGATIVE NEGATIVE    Comment: (NOTE) SARS-CoV-2 target nucleic acids are NOT DETECTED.  The SARS-CoV-2  RNA is generally detectable in upper respiratoy specimens during the acute phase of infection. The lowest concentration of SARS-CoV-2 viral copies this assay can detect is 131 copies/mL. A negative result does not preclude SARS-Cov-2 infection and should not be used as the sole basis for treatment or other patient management decisions. A negative result may occur with  improper specimen collection/handling, submission of specimen other than nasopharyngeal swab, presence of viral mutation(s) within the areas targeted by this assay, and inadequate number of viral copies (<131 copies/mL). A negative result must be combined with clinical observations, patient history, and epidemiological information. The expected result is Negative.  Fact Sheet for Patients:  PinkCheek.be  Fact Sheet for Healthcare Providers:  [employer]  This test is no t yet approved or cleared by the [place] FDA and  has been authorized for detection and/or diagnosis of SARS-CoV-2 by FDA under an Emergency Use Authorization (EUA). This EUA will remain  in effect (meaning this test can be used) for the duration of the COVID-19 declaration under Section 564(b)(1) of the Act, 21 U.S.C. section 360bbb-3(b)(1), unless the authorization is terminated or revoked sooner.     Influenza A by PCR NEGATIVE NEGATIVE   Influenza B by PCR NEGATIVE NEGATIVE    Comment: (NOTE) The Xpert Xpress SARS-CoV-2/FLU/RSV assay is intended as an aid in  the diagnosis of influenza from Nasopharyngeal swab specimens and  should not be used as a sole basis for treatment. Nasal washings and  aspirates are unacceptable for Xpert Xpress SARS-CoV-2/FLU/RSV  testing.  Fact Sheet for Patients: PinkCheek[employer]be  Fact Sheet for Healthcare Providers: [employer]  This test is not yet approved or cleared by the [place] FDA  and  has been authorized for detection and/or diagnosis of SARS-CoV-2 by  FDA under an Emergency Use Authorization (EUA). This EUA will remain  in effect (meaning this test can be used) for the duration of the  Covid-19 declaration under Section 564(b)(1) of the  Act, 21  U.S.C. section 360bbb-3(b)(1), unless the authorization is  terminated or revoked. Performed at [facility], [place] [address], [place], [place] [address]   Hepatic function panel     Status: Abnormal   Collection Time: 06/17/20  3:15 AM  Result Value Ref Range   Total Protein 7.2 6.5 - 8.1 g/dL   Albumin 3.7 3.5 - 5.0 g/dL   AST 132 (H) 15 - 41 U/L   ALT <5 0 - 44 U/L   Alkaline Phosphatase 38 38 - 126 U/L   Total Bilirubin 0.5 0.3 - 1.2 mg/dL   Bilirubin, Direct 0.1 0.0 - 0.2 mg/dL   Indirect Bilirubin 0.4 0.3 - 0.9 mg/dL    Comment: Performed at [facility], [place] [address] [place], [place] [address]  CBC WITH DIFFERENTIAL     Status: Abnormal   Collection Time: 06/17/20  3:15 AM  Result Value Ref Range   WBC 20.5 (H) 4.0 - 10.5 K/uL   RBC 3.07 (L) 3.87 - 5.11 MIL/uL   Hemoglobin 10.0 (L) 12.0 - 15.0 g/dL   HCT 30.4 (L) 36 - 46 %   MCV 99.0 80.0 - 100.0 fL   MCH 32.6 26.0 - 34.0 pg   MCHC 32.9 30.0 - 36.0 g/dL   RDW 15.9 (H) 11.5 - 15.5 %   Platelets 242 150 - 400 K/uL   nRBC 0.0 0.0 - 0.2 %   Neutrophils Relative % 92 %   Neutro Abs 18.8 (H) 1.7 - 7.7 K/uL   Lymphocytes Relative 3 %   Lymphs Abs 0.6 (L) 0.7 - 4.0 K/uL   Monocytes Relative 4 %   Monocytes Absolute 0.7 0.1 - 1.0 K/uL   Eosinophils Relative 0 %   Eosinophils Absolute 0.0 0.0 - 0.5 K/uL   Basophils Relative 0 %   Basophils Absolute 0.0 0.0 - 0.1 K/uL   Immature Granulocytes 1 %   Abs Immature Granulocytes 0.23 (H) 0.00 - 0.07 K/uL    Comment: Performed at [facility], [place] [address], [place], [place] [address]  Basic metabolic panel     Status: Abnormal   Collection Time:  06/17/20  3:15 AM  Result Value Ref Range   Sodium 137 135 - 145 mmol/L   Potassium 2.5 (LL) 3.5 - 5.1 mmol/L    Comment: CRITICAL RESULT CALLED TO, READ BACK BY AND VERIFIED WITH: RN Bethann Humble AT 0272 06/17/20 CRUICKSHANK A    Chloride 103 98 - 111 mmol/L   CO2 19 (L) 22 - 32 mmol/L   Glucose, Bld 223 (H) 70 - 99 mg/dL    Comment: Glucose reference range applies only to samples taken after fasting for at least 8 hours.   BUN 14 6 - 20 mg/dL   Creatinine, Ser 0.85 0.44 - 1.00 mg/dL   Calcium 9.1 8.9 - 10.3 mg/dL   GFR, Estimated >60 >60 mL/min   Anion gap 15 5 - 15    Comment: Performed at [facility], [place] [address], [place], [place] [address]  Protime-INR     Status: None   Collection Time: 06/17/20  3:15 AM  Result Value Ref Range   Prothrombin Time 14.8 11.4 - 15.2 seconds   INR 1.2 0.8 - 1.2    Comment: (NOTE) INR goal varies based on device and disease states. Performed at [facility], [place] [address] [place], [name] [address]   Magnesium     Status: Abnormal  Collection Time: 06/17/20  9:00 AM  Result Value Ref Range   Magnesium 0.3 (LL) 1.7 - 2.4 mg/dL    Comment: CRITICAL RESULT CALLED TO, READ BACK BY AND VERIFIED WITH: CLAPP,S @ 0924 ON [id number] BY POTEAT,S Performed at [facility], [place] [address], [place], [place] [address]   CBC with Differential/Platelet     Status: Abnormal   Collection Time: 06/17/20  9:00 AM  Result Value Ref Range   WBC 23.2 (H) 4.0 - 10.5 K/uL   RBC 3.09 (L) 3.87 - 5.11 MIL/uL   Hemoglobin 10.3 (L) 12.0 - 15.0 g/dL   HCT 30.4 (L) 36 - 46 %   MCV 98.4 80.0 - 100.0 fL   MCH 33.3 26.0 - 34.0 pg   MCHC 33.9 30.0 - 36.0 g/dL   RDW 15.8 (H) 11.5 - 15.5 %   Platelets 251 150 - 400 K/uL   nRBC 0.0 0.0 - 0.2 %   Neutrophils Relative % 93 %   Neutro Abs 21.5 (H) 1.7 - 7.7 K/uL   Lymphocytes Relative 2 %   Lymphs Abs 0.5 (L) 0.7 - 4.0 K/uL   Monocytes Relative 4 %   Monocytes  Absolute 1.0 0.1 - 1.0 K/uL   Eosinophils Relative 0 %   Eosinophils Absolute 0.0 0.0 - 0.5 K/uL   Basophils Relative 0 %   Basophils Absolute 0.0 0.0 - 0.1 K/uL   Immature Granulocytes 1 %   Abs Immature Granulocytes 0.19 (H) 0.00 - 0.07 K/uL    Comment: Performed at [facility], [place] [address], [place], [place] [address]  Lactic acid, plasma     Status: Abnormal   Collection Time: 06/17/20  9:00 AM  Result Value Ref Range   Lactic Acid, Venous 2.5 (HH) 0.5 - 1.9 mmol/L    Comment: CRITICAL RESULT CALLED TO, READ BACK BY AND VERIFIED WITH: CLAPP,S @ 0928 ON 10.13.21 BY POTEAT,S Performed at [facility], [place] [address] [place], [place] [address]   Gastrointestinal Panel by PCR , Stool     Status: None   Collection Time: 06/17/20  9:00 AM   Specimen: STOOL  Result Value Ref Range   Campylobacter species NOT DETECTED NOT DETECTED   Plesimonas shigelloides NOT DETECTED NOT DETECTED   Salmonella species NOT DETECTED NOT DETECTED   Yersinia enterocolitica NOT DETECTED NOT DETECTED   Vibrio species NOT DETECTED NOT DETECTED   Vibrio cholerae NOT DETECTED NOT DETECTED   Enteroaggregative E coli (EAEC) NOT DETECTED NOT DETECTED   Enteropathogenic E coli (EPEC) NOT DETECTED NOT DETECTED   Enterotoxigenic E coli (ETEC) NOT DETECTED NOT DETECTED   Shiga like toxin producing E coli (STEC) NOT DETECTED NOT DETECTED   Shigella/Enteroinvasive E coli (EIEC) NOT DETECTED NOT DETECTED   Cryptosporidium NOT DETECTED NOT DETECTED   Cyclospora cayetanensis NOT DETECTED NOT DETECTED   Entamoeba histolytica NOT DETECTED NOT DETECTED   Giardia lamblia NOT DETECTED NOT DETECTED   Adenovirus F40/41 NOT DETECTED NOT DETECTED   Astrovirus NOT DETECTED NOT DETECTED   Norovirus GI/GII NOT DETECTED NOT DETECTED   Rotavirus A NOT DETECTED NOT DETECTED   Sapovirus (I, II, IV, and V) NOT DETECTED NOT DETECTED    Comment: Performed at [facility], [place] [place], [place] [address]  C Difficile Quick Screen w PCR reflex     Status: None   Collection Time: 06/17/20  9:00 AM   Specimen: STOOL  Result Value Ref Range  C Diff antigen NEGATIVE NEGATIVE   C Diff toxin NEGATIVE NEGATIVE   C Diff interpretation No C. difficile detected.     Comment: Performed at [facility], [place] [address], [place], [place] [address]  CBG monitoring, ED     Status: Abnormal   Collection Time: 06/17/20 11:47 AM  Result Value Ref Range   Glucose-Capillary 131 (H) 70 - 99 mg/dL    Comment: Glucose reference range applies only to samples taken after fasting for at least 8 hours.  Basic metabolic panel     Status: Abnormal   Collection Time: 06/17/20  2:30 PM  Result Value Ref Range   Sodium 139 135 - 145 mmol/L   Potassium 2.6 (LL) 3.5 - 5.1 mmol/L    Comment: CRITICAL RESULT CALLED TO, READ BACK BY AND VERIFIED WITH: M BOWEN AT 1508 ON 06/17/2020 BY MOSLEY,J    Chloride 107 98 - 111 mmol/L   CO2 22 22 - 32 mmol/L   Glucose, Bld 143 (H) 70 - 99 mg/dL    Comment: Glucose reference range applies only to samples taken after fasting for at least 8 hours.   BUN 10 6 - 20 mg/dL   Creatinine, Ser 0.49 0.44 - 1.00 mg/dL   Calcium 8.8 (L) 8.9 - 10.3 mg/dL   GFR, Estimated >60 >60 mL/min   Anion gap 10 5 - 15    Comment: Performed at [facility], [place] [address], [place], [place] [address]  Magnesium     Status: Abnormal   Collection Time: 06/17/20  2:30 PM  Result Value Ref Range   Magnesium 1.5 (L) 1.7 - 2.4 mg/dL    Comment: Performed at [facility], [place] [address] [place], [place] [address]  Comprehensive metabolic panel     Status: Abnormal   Collection Time: 06/18/20  6:00 AM  Result Value Ref Range   Sodium 141 135 - 145 mmol/L   Potassium 3.6 3.5 - 5.1 mmol/L    Comment: DELTA CHECK NOTED NO VISIBLE HEMOLYSIS    Chloride 111 98 - 111 mmol/L   CO2 22 22 - 32 mmol/L   Glucose, Bld 118 (H)  70 - 99 mg/dL    Comment: Glucose reference range applies only to samples taken after fasting for at least 8 hours.   BUN 6 6 - 20 mg/dL   Creatinine, Ser 0.66 0.44 - 1.00 mg/dL   Calcium 8.6 (L) 8.9 - 10.3 mg/dL   Total Protein 6.4 (L) 6.5 - 8.1 g/dL   Albumin 3.5 3.5 - 5.0 g/dL   AST 904 (H) 15 - 41 U/L   ALT 193 (H) 0 - 44 U/L   Alkaline Phosphatase 34 (L) 38 - 126 U/L   Total Bilirubin 0.5 0.3 - 1.2 mg/dL   GFR, Estimated >60 >60 mL/min   Anion gap 8 5 - 15    Comment: Performed at [facility], [place] [address], [place],  [address]  Magnesium     Status: Abnormal   Collection Time: 06/18/20  6:00 AM  Result Value Ref Range   Magnesium 1.5 (L) 1.7 - 2.4 mg/dL    Comment: Performed at [facility], [place] [address] [place],  [address]  Lactic acid, plasma     Status: None   Collection Time: 06/18/20  6:00 AM  Result Value Ref Range   Lactic Acid, Venous 1.7 0.5 - 1.9 mmol/L    Comment: Performed at [facility], [place] [address], [place], [place] [address]  Comprehensive metabolic panel     Status: Abnormal   Collection Time: 06/19/20  5:59 AM  Result Value Ref Range   Sodium 140 135 - 145 mmol/L   Potassium 4.3 3.5 - 5.1 mmol/L   Chloride 110 98 - 111 mmol/L   CO2 22 22 - 32 mmol/L   Glucose, Bld 105 (H) 70 - 99 mg/dL    Comment: Glucose reference range applies only to samples taken after fasting for at least 8 hours.   BUN 10 6 - 20 mg/dL   Creatinine, Ser 0.55 0.44 - 1.00 mg/dL   Calcium 9.0 8.9 - 10.3 mg/dL   Total Protein 6.8 6.5 - 8.1 g/dL   Albumin 3.6 3.5 - 5.0 g/dL   AST 546 (H) 15 - 41 U/L   ALT 181 (H) 0 - 44 U/L   Alkaline Phosphatase 36 (L) 38 - 126 U/L   Total Bilirubin 0.4 0.3 - 1.2 mg/dL   GFR, Estimated >60 >60 mL/min   Anion gap 8 5 - 15    Comment: Performed at [facility], [place] [address] [place], [place] [address]  CBC with Differential/Platelet     Status:  Abnormal   Collection Time: 06/19/20  5:59 AM  Result Value Ref Range   WBC 10.6 (H) 4.0 - 10.5 K/uL   RBC 2.79 (L) 3.87 - 5.11 MIL/uL   Hemoglobin 9.1 (L) 12.0 - 15.0 g/dL   HCT 28.5 (L) 36 - 46 %   MCV 102.2 (H) 80.0 - 100.0 fL   MCH 32.6 26.0 - 34.0 pg   MCHC 31.9 30.0 - 36.0 g/dL   RDW 15.9 (H) 11.5 - 15.5 %   Platelets 198 150 - 400 K/uL   nRBC 0.0 0.0 - 0.2 %   Neutrophils Relative % 87 %   Neutro Abs 9.2 (H) 1.7 - 7.7 K/uL   Lymphocytes Relative 7 %   Lymphs Abs 0.8 0.7 - 4.0 K/uL   Monocytes Relative 5 %   Monocytes Absolute 0.5 0.1 - 1.0 K/uL   Eosinophils Relative 0 %   Eosinophils Absolute 0.0 0.0 - 0.5 K/uL   Basophils Relative 0 %   Basophils Absolute 0.0 0.0 - 0.1 K/uL   Immature Granulocytes 1 %   Abs Immature Granulocytes 0.10 (H) 0.00 - 0.07 K/uL    Comment: Performed at [facility], [place] [address], [place], [place] [address]  Magnesium     Status: None   Collection Time: 06/19/20  5:59 AM  Result Value Ref Range   Magnesium 1.9 1.7 - 2.4 mg/dL    Comment: Performed at [facility], [place] [address], [place], [place] [address]     Psychiatric Specialty Exam: Physical Exam  Review of Systems  Constitutional: Positive for fatigue.       Tired  Psychiatric/Behavioral: Positive for dysphoric mood and sleep disturbance.    Weight 180 lb (81.6 kg).There is no height or weight on file to calculate BMI.  General Appearance: NA  Eye Contact:  NA  Speech:  Slow  Volume:  Decreased  Mood:  Dysphoric and tired, fatigue  Affect:  NA  Thought Process:  Goal Directed  Orientation:  Full (Time, Place, and Person)  Thought Content:  Rumination  Suicidal Thoughts:  No  Homicidal Thoughts:  No  Memory:  Immediate;   Good Recent;   Good Remote;   Good  Judgement:  Intact  Insight:  Present  Psychomotor Activity:  NA  Concentration:  Concentration: Fair and Attention Span: Fair  Recall:  Good  Fund of Knowledge:  Good   Language:  Good  Akathisia:  No  Handed:  Right  AIMS (if indicated):     Assets:  Communication Skills Desire for Improvement Housing Resilience Social Support  ADL's:  Intact  Cognition:  WNL  Sleep:   sometimes too much      Assessment and Plan: Bipolar disorder type I.  Generalized anxiety disorder.  I reviewed blood work results.  Her liver enzymes are very high.  She was told to stop chemotherapy and may consider surgery versus immunotherapy for the breast cancer.  She admitted a lot of health issues going on and she has a lot of somatic complaints.  She has to follow-up with the physician after vulvar cancer treatment and she is concerned about that.  I reviewed her medication.  At this time she does not need Topamax and trazodone because she is losing weight and does not have as bad headaches and has no insomnia and actually she is tired and fatigued and sleeps too much.  I will discontinue Topamax, trazodone and also decrease olanzapine from 15 mg-to 10 mg but will continue venlafaxine 112.5 mg to help depression and anxiety.  I will continue lorazepam 1 mg 3 times a day as it helps the anxiety.  I offered therapy but at this time patient like to focus on her general health.  Her liver enzymes are high and we will avoid adding more medication at this time until her liver enzymes started to get better.  Patient is hoping after she stopped the chemotherapy it may help liver enzymes to go down.  I recommend to call us back if she has any question, concern or if she feels worsening of the symptom.  Follow-up in 3 months.  Follow Up Instructions:    I discussed the assessment and treatment plan with the patient. The patient was provided an opportunity to ask questions and all were answered. The patient agreed with the plan and demonstrated an understanding of the instructions.   The patient was advised to call back or seek an in-person evaluation if the symptoms worsen or if the  condition fails to improve as anticipated.  I provided 30 minutes of non-face-to-face time during this encounter.   Kathlee Nations, MD

## 2020-06-29 MED FILL — OLANZapine 10 MG TABS: 10 | 90 days supply | Qty: 90 | Fill #0

## 2020-06-30 ENCOUNTER — Other Ambulatory Visit: Payer: No Typology Code available for payment source

## 2020-06-30 ENCOUNTER — Encounter: Payer: Self-pay | Admitting: Genetic Counselor

## 2020-06-30 ENCOUNTER — Ambulatory Visit: Payer: No Typology Code available for payment source | Admitting: Hematology and Oncology

## 2020-06-30 ENCOUNTER — Ambulatory Visit: Payer: No Typology Code available for payment source

## 2020-06-30 MED FILL — POTASSIUM CHLORIDE CRYS ER: 20 | 30 days supply | Qty: 60 | Fill #1

## 2020-07-01 ENCOUNTER — Other Ambulatory Visit: Payer: Self-pay | Admitting: Surgery

## 2020-07-01 ENCOUNTER — Telehealth: Payer: Self-pay | Admitting: Hematology and Oncology

## 2020-07-01 DIAGNOSIS — C50912 Malignant neoplasm of unspecified site of left female breast: Secondary | ICD-10-CM

## 2020-07-01 NOTE — Telephone Encounter (Signed)
No 10/22 los, no changes made to pt schedule

## 2020-07-07 ENCOUNTER — Encounter: Payer: Self-pay | Admitting: *Deleted

## 2020-07-08 ENCOUNTER — Other Ambulatory Visit: Payer: Self-pay | Admitting: Surgery

## 2020-07-08 ENCOUNTER — Telehealth: Payer: Self-pay | Admitting: Hematology and Oncology

## 2020-07-08 DIAGNOSIS — C50912 Malignant neoplasm of unspecified site of left female breast: Secondary | ICD-10-CM

## 2020-07-08 MED FILL — PROCHLORPERAZINE 10 MG TAB: 10 | 7 days supply | Qty: 30 | Fill #1

## 2020-07-08 NOTE — Telephone Encounter (Signed)
Scheduled appt per 11/3 sch msg - pt aware of appt date and time on 11/30

## 2020-07-10 ENCOUNTER — Ambulatory Visit: Payer: No Typology Code available for payment source | Admitting: Hematology and Oncology

## 2020-07-10 ENCOUNTER — Other Ambulatory Visit: Payer: No Typology Code available for payment source

## 2020-07-10 ENCOUNTER — Encounter: Payer: Self-pay | Admitting: Nutrition

## 2020-07-10 ENCOUNTER — Ambulatory Visit: Payer: No Typology Code available for payment source

## 2020-07-15 ENCOUNTER — Other Ambulatory Visit: Payer: Self-pay | Admitting: *Deleted

## 2020-07-15 ENCOUNTER — Other Ambulatory Visit: Payer: Self-pay | Admitting: Hematology and Oncology

## 2020-07-15 ENCOUNTER — Telehealth: Payer: Self-pay | Admitting: Family Medicine

## 2020-07-15 MED ORDER — LISINOPRIL 20 MG PO TABS
20.0000 mg | ORAL_TABLET | Freq: Every day | ORAL | 0 refills | Status: DC
Start: 2020-07-15 — End: 2020-08-11

## 2020-07-15 MED FILL — LISINOPRIL 20 MG TABS: 20 | 30 days supply | Qty: 30 | Fill #0

## 2020-07-15 MED FILL — VENLAFAXINE HCL ER 37.5 MG: 37.5 | 30 days supply | Qty: 90 | Fill #0

## 2020-07-15 NOTE — Telephone Encounter (Signed)
PATIENT WAS DIAGNOSED WITH CANCER. SHE WAS IN THE HOSPITAL 3 WEEKS AGO. SHE'S FORMALLY DR. Gearldine Shown PATIENT SO SHE IS IN BETWEEN PRIMARY CARE PROVIDERS. SHE CAN NOT GO TO DOCTOR'S OFFICES AND WAITING ROOMS DUE TO HER CANCER AND CHEMOTHERAPY. THE HOSPITAL DOCTOR CHANGED HER LISINOPRIL TO 20 mg BECAUSE SHE DID NOT NEED THE HCTZ PART OF IT. HE GAVE HER A COURTESY REFILL UNTIL SHE CAN BE SEEN. (SHE WILL NEED A TOC WITH A NEW PROVIDER) HOW SHOULD THIS BE HANDLED BECAUSE I DON'T THINK WITH A NEW PROVIDER WE CAN DO A VIRTUAL? WHO WOULD BE THE BEST PROVIDER TO HANDLE HER CANCER DIAGNOSIS HERE? BEST PHONE FOR PATIENT [phone] (CELL) MBC

## 2020-07-16 NOTE — Telephone Encounter (Signed)
Due to the circumstances we can accommodate her by having her come to the office but wait in her car, if she will call us when she arrives we can register her from her car, call her in once were ready to put her in a room and take her directly back to keep her from siting in the waiting area. As far as whom she should see it is recommended she see first available and then if necessary we can establish her with an MD later on. I will send curtesy refill of her BP med if she cannot get appt. before it is due - please let me know if that is needed. We can assure her we will do extra disinfecting given her immune compromised state and we will accommodate her within our capability. If she has more questions I would be happy  to answer.

## 2020-07-16 NOTE — Telephone Encounter (Signed)
Will confirm with provider to [facility] for a TOC

## 2020-07-16 NOTE — Telephone Encounter (Signed)
Called pt and scheduled toc with Just on 08/11/20

## 2020-07-17 ENCOUNTER — Encounter: Payer: Self-pay | Admitting: Nutrition

## 2020-07-17 ENCOUNTER — Ambulatory Visit: Payer: Self-pay

## 2020-07-17 ENCOUNTER — Other Ambulatory Visit: Payer: Self-pay

## 2020-07-17 ENCOUNTER — Inpatient Hospital Stay: Payer: No Typology Code available for payment source | Attending: Gynecologic Oncology | Admitting: Nutrition

## 2020-07-17 ENCOUNTER — Ambulatory Visit: Payer: Self-pay | Admitting: Hematology and Oncology

## 2020-07-17 DIAGNOSIS — Z23 Encounter for immunization: Secondary | ICD-10-CM | POA: Insufficient documentation

## 2020-07-17 MED FILL — XARELTO 20 MG TABLET: 20 | 30 days supply | Qty: 30 | Fill #2

## 2020-07-17 NOTE — Progress Notes (Signed)
Patient did not show up for nutrition follow up appointment scheduled today.

## 2020-07-20 ENCOUNTER — Telehealth: Payer: Self-pay | Admitting: Genetic Counselor

## 2020-07-20 NOTE — Telephone Encounter (Signed)
Called patient to see to ensure she received information in mail regarding genetic testing results and to check in to see if she had any additional questions.  LVM requesting a call back if she has additional questions

## 2020-07-20 NOTE — Progress Notes (Signed)
[place], [place] [address] Phone: [phone] Fax: [phone]      Your procedure is scheduled on November 22  Report to [facility] Main Entrance "A" at 1345 P.M., and check in at the Admitting office.  Call this number if you have problems the morning of surgery:  [phone]  Call [phone] if you have any questions prior to your surgery date Monday-Friday 8am-4pm    Remember:  Do not eat after midnight the night before your surgery  You may drink clear liquids until 1245 pm the afternoon of your surgery.   Clear liquids allowed are: Water, Non-Citrus Juices (without pulp), Carbonated Beverages, Clear Tea, Black Coffee Only, and Gatorade    Take these medicines the morning of surgery with A SIP OF WATER  acetaminophen (TYLENOL) if needed LORazepam (ATIVAN) if needed omeprazole (PRILOSEC)  venlafaxine XR (EFFEXOR XR)  Follow your surgeon's instructions on when to stop rivaroxaban (XARELTO).  If no instructions were given by your surgeon then you will need to call the office to get those instructions.     As of today, STOP taking any Aspirin (unless otherwise instructed by your surgeon) Aleve, Naproxen, Ibuprofen, Motrin, Advil, Goody's, BC's, all herbal medications, fish oil, and all vitamins.                      Do not wear jewelry, make up, or nail polish            Do not wear lotions, powders, perfumes/colognes, or deodorant.            Do not shave 48 hours prior to surgery.  Men may shave face and neck.            Do not bring valuables to the hospital.            [facility] is not responsible for any belongings or valuables.  Do NOT Smoke (Tobacco/Vaping) or drink Alcohol 24 hours prior to your procedure If you use a CPAP at night, you may bring all equipment for your overnight stay.   Contacts, glasses, dentures or bridgework may not be worn into surgery.      For  patients admitted to the hospital, discharge time will be determined by your treatment team.   Patients discharged the day of surgery will not be allowed to drive home, and someone needs to stay with them for 24 hours.    Special instructions:   [facility]- Preparing For Surgery  Before surgery, you can play an important role. Because skin is not sterile, your skin needs to be as free of germs as possible. You can reduce the number of germs on your skin by washing with CHG (chlorahexidine gluconate) Soap before surgery.  CHG is an antiseptic cleaner which kills germs and bonds with the skin to continue killing germs even after washing.    Oral Hygiene is also important to reduce your risk of infection.  Remember - BRUSH YOUR TEETH THE MORNING OF SURGERY WITH YOUR REGULAR TOOTHPASTE  Please do not use if you have an allergy to CHG or antibacterial soaps. If your skin becomes reddened/irritated stop using the CHG.  Do not shave (including legs and underarms) for at least 48 hours prior to first CHG shower. It is OK to shave your face.  Please follow these instructions carefully.   1. Shower the NIGHT BEFORE SURGERY and the [facility]  OF SURGERY with CHG Soap.   2. If you chose to wash your hair, wash your hair first as usual with your normal shampoo.  3. After you shampoo, rinse your hair and body thoroughly to remove the shampoo.  4. Use CHG as you would any other liquid soap. You can apply CHG directly to the skin and wash gently with a scrungie or a clean washcloth.   5. Apply the CHG Soap to your body ONLY FROM THE NECK DOWN.  Do not use on open wounds or open sores. Avoid contact with your eyes, ears, mouth and genitals (private parts). Wash Face and genitals (private parts)  with your normal soap.   6. Wash thoroughly, paying special attention to the area where your surgery will be performed.  7. Thoroughly rinse your body with warm water from the neck down.  8. DO NOT shower/wash  with your normal soap after using and rinsing off the CHG Soap.  9. Pat yourself dry with a CLEAN TOWEL.  10. Wear CLEAN PAJAMAS to bed the night before surgery  11. Place CLEAN SHEETS on your bed the night of your first shower and DO NOT SLEEP WITH PETS.   Day of Surgery: Wear Clean/Comfortable clothing the morning of surgery Do not apply any deodorants/lotions.   Remember to brush your teeth WITH YOUR REGULAR TOOTHPASTE.   Please read over the following fact sheets that you were given.

## 2020-07-21 ENCOUNTER — Encounter (HOSPITAL_COMMUNITY)
Admission: RE | Admit: 2020-07-21 | Discharge: 2020-07-21 | Disposition: A | Discharge status: Home / Self Care | Payer: Self-pay | Source: Ambulatory Visit | Attending: Surgery | Admitting: Surgery

## 2020-07-21 ENCOUNTER — Encounter (HOSPITAL_COMMUNITY): Payer: Self-pay

## 2020-07-21 ENCOUNTER — Other Ambulatory Visit: Payer: Self-pay

## 2020-07-21 DIAGNOSIS — Z01812 Encounter for preprocedural laboratory examination: Secondary | ICD-10-CM | POA: Insufficient documentation

## 2020-07-21 HISTORY — DX: Depression, unspecified: F32.A

## 2020-07-21 HISTORY — DX: Other specified postprocedural states: Z98.890

## 2020-07-21 HISTORY — DX: Nausea with vomiting, unspecified: R11.2

## 2020-07-21 LAB — COMPREHENSIVE METABOLIC PANEL
ALT: 24 U/L (ref 0–44)
AST: 26 U/L (ref 15–41)
Albumin: 4 g/dL (ref 3.5–5.0)
Alkaline Phosphatase: 40 U/L (ref 38–126)
Anion gap: 11 (ref 5–15)
BUN: 9 mg/dL (ref 6–20)
CO2: 20 mmol/L — ABNORMAL LOW (ref 22–32)
Calcium: 10 mg/dL (ref 8.9–10.3)
Chloride: 104 mmol/L (ref 98–111)
Creatinine, Ser: 0.74 mg/dL (ref 0.44–1.00)
GFR, Estimated: >60 mL/min (ref >60)
Glucose, Bld: 108 mg/dL — ABNORMAL HIGH (ref 70–99)
Potassium: 4 mmol/L (ref 3.5–5.1)
Sodium: 135 mmol/L (ref 135–145)
Total Bilirubin: 0.3 mg/dL (ref 0.3–1.2)
Total Protein: 7.1 g/dL (ref 6.5–8.1)

## 2020-07-21 LAB — HCG, SERUM, QUALITATIVE: Preg, Serum: NEGATIVE

## 2020-07-21 LAB — CBC
HCT: 42.4 % (ref 36.0–46.0)
Hemoglobin: 13.3 g/dL (ref 12.0–15.0)
MCH: 31.9 pg (ref 26.0–34.0)
MCHC: 31.4 g/dL (ref 30.0–36.0)
MCV: 101.7 fL — ABNORMAL HIGH (ref 80.0–100.0)
Platelets: 381 10*3/uL (ref 150–400)
RBC: 4.17 MIL/uL (ref 3.87–5.11)
RDW: 14 % (ref 11.5–15.5)
WBC: 6.4 10*3/uL (ref 4.0–10.5)
nRBC: 0 % (ref 0.0–0.2)

## 2020-07-21 NOTE — Progress Notes (Signed)
PCP - Dr. Pamella Pert with [facility]  Cardiologist - Denies  Chest x-ray - 04/11/20 EKG - 03/05/20 Stress Test - Denies ECHO - 03/02/20 Cardiac Cath - Denies  Sleep Study - denies  DM - denies  Blood Thinner Instructions: Xarelto last dose 07/22/20 Aspirin Instructions:Denies  ERAS Protcol -Yes instructions given  COVID TEST- 07/23/20  Anesthesia review: No  Patient denies shortness of breath, fever, cough and chest pain at PAT appointment   All instructions explained to the patient, with a verbal understanding of the material. Patient agrees to go over the instructions while at home for a better understanding. Patient also instructed to self quarantine after being tested for COVID-19. The opportunity to ask questions was provided.

## 2020-07-23 ENCOUNTER — Other Ambulatory Visit (HOSPITAL_COMMUNITY)
Admission: RE | Admit: 2020-07-23 | Discharge: 2020-07-23 | Disposition: A | Discharge status: Home / Self Care | Payer: HRSA Program | Source: Ambulatory Visit | Attending: Surgery | Admitting: Surgery

## 2020-07-23 DIAGNOSIS — Z20822 Contact with and (suspected) exposure to covid-19: Secondary | ICD-10-CM | POA: Diagnosis not present

## 2020-07-23 DIAGNOSIS — Z01812 Encounter for preprocedural laboratory examination: Secondary | ICD-10-CM | POA: Insufficient documentation

## 2020-07-24 ENCOUNTER — Ambulatory Visit
Admission: RE | Admit: 2020-07-24 | Discharge: 2020-07-24 | Disposition: A | Discharge status: Home / Self Care | Payer: Self-pay | Source: Ambulatory Visit | Attending: Surgery | Admitting: Surgery

## 2020-07-24 ENCOUNTER — Other Ambulatory Visit: Payer: Self-pay | Admitting: Surgery

## 2020-07-24 ENCOUNTER — Other Ambulatory Visit: Payer: Self-pay

## 2020-07-24 DIAGNOSIS — C50912 Malignant neoplasm of unspecified site of left female breast: Secondary | ICD-10-CM

## 2020-07-24 LAB — SARS CORONAVIRUS 2 (TAT 6-24 HRS): SARS Coronavirus 2: NEGATIVE

## 2020-07-25 MED FILL — LORazepam 1 MG TABS: 1 | 30 days supply | Qty: 90 | Fill #0

## 2020-07-26 NOTE — H&P (Signed)
Sandra Miller  Location: [facility] Patient #: [id number] DOB: [birth year] Single / Language: Cleophus Miller / Race: Black or African American Female  History of Present Illness   The patient is a 44 year old female who presents with a complaint of breast mass.  The PCP is Dr. Hiram Comber  She comes by herself. But she had her sister Bobby Rumpf on the phone during the interview.  She was getting neoadjuvant chemotherapy for left sided breast cancer by Dr. Lindi Adie. But she has tolerated her most recent chemotherapy poorly and required hospitalization from 10/12 to 06/19/2020. So her chemotherapy has been stopped secondary to toxicity. We are now proceeding with surgery. She had a breast MRI on 06/24/2020 which showed 1. No significant change in the size and appearance of spiculated mass in the MEDIAL central aspect of the LEFT breast. 2. Small satellite malignant nodule previously noted in the central portion of the LEFT breast is not well seen today. 3. Discrete enhancing mass in the UPPER-OUTER QUADRANT of the LEFT breast, at the site of previous MR guided core biopsy showing benign breast tissue remains suspicious despite benign histology at biopsy. Though her MRI is not very encouraging, her PE of her left breast is clearly better. She has also tested positive for BRIP1 gene. We discussed this quite a bit and that the risks of the genetic abnormality is primarily with ovarian cancer. The risk for breast cancer is still unclear.  I discussed the options for breast cancer treatment with the patient. I discussed the surgical options of lumpectomy vs. mastectomy. I think that she is going to be best served with a left mastectomy. Because of her response on MRI, I think that we can do a targeted left axillary node dissection. I explained the risks for surgery including bleeding, infection, nerve injury, and recurrence of  cancer. I explained the drains after surgery. She is not interested in reconstruction at this time. She is also still smoking.  Plan: 1. for left mastectomy with targeted left axillary node dissection (seed localization), 2. stop her xarelto 5 days before surgery  Review of Systems as stated in this history (HPI) or in the review of systems. Otherwise all other 12 point ROS are negative  Past Medical History: 1. Left breast cancer This was originally found on a PET scan when she was being evaluated for the vulvar cancer. left breast on 02/18/2020 - UIQ, 11 o'clock - IDC, ER - 95%, PR - 95%, Ki67 - 75%, Her2Neu - positive, POSITIVE left axillary lymph node Oncology - Dr. Lindi Adie and Sondra Come  She received partial chemotx, but stopped because of toxicity  2. 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 Denman George and Dr. Kennon Rounds saw Dr. Margaretmary Bayley at [facility] She completed pelvic/vulvar rad therapy - Dr. Sondra Come - in September 3. History of bipolar disease and alcohol dependence Sees Dr. Dossie Der Arfeen 4. Smokes I have continued to encourage her to quit 5. HTN x 1 year 6. History of kidney stones - seen at [facility] 7. GERD - history of esophageal stricture Has been seen by Dr. Carlean Purl 8. She had a colonoscopy about 5 years ago - she has a brother with colon ca? 9. Obese - BMI - 35 10. Genetics - she is BRIP1 positive This carries a risk for ovarian cancer 11. Right IJ power port - 03/06/2020 - Najat Olazabal 12. Anticoagulated on Xarelto for jugular vein thrombosis diagnosed on 04/11/2020 by CT scan  Social History: Unmarried Has 2 children - 67 and 60 yo Her sister, Bobby Rumpf, is with her  The patient's family history was non contributory.  Allergies (Chanel Teressa Senter, CMA; 07/01/2020 11:54 AM) Aspirin Adult Low Dose *ANALGESICS - NonNarcotic*  Minocycline HCl *Tetracyclines**   Depakote *ANTICONVULSANTS*  Dilaudid *ANALGESICS - OPIOID*  Allergies Reconciled   Medication History (Chanel Teressa Senter, CMA; 07/01/2020 11:55 AM) Venlafaxine HCl ER (37.5MG Capsule ER 24HR, Oral) Active. Omeprazole (40MG Capsule DR, Oral) Active. OLANZapine (15MG Tablet, Oral) Active. LORazepam (1MG Tablet, Oral) Active. Gabapentin (300MG Capsule, Oral) Active. Lisinopril (Oral) Specific strength unknown - Active. Compazine (Oral) Specific strength unknown - Active. Potassium (Oral) Specific strength unknown - Active. Magnesium (Oral) Specific strength unknown - Active. Medications Reconciled  Vitals (Chanel Nolan CMA; 07/01/2020 11:56 AM) 07/01/2020 11:55 AM Weight: 187.25 lb Height: 65.5in Body Surface Area: 1.93 m Body Mass Index: 30.69 kg/m  Temp.: 98.29F  Pulse: 119 (Regular)  BP: 132/82(Sitting, Left Arm, Standard)    Physical Exam  General: WN AA F who is alert and generally healthy appearing. She is wearing a mask. HEENT: Normal. Pupils equal.  Neck: Supple. No mass. No thyroid mass.  Lymph Nodes: No supraclavicular or cervical. The left axillary node I could feel before is gone.  Lungs: Clear to auscultation and symmetric breath sounds. Heart: RRR. No murmur or rub.  Breasts: Right - no mass or nodule. She has a port in the upper right chest. Left - The upper inner quadrant left breast mass is much less noticeable on this exam.  Abdomen: Soft. No mass. No tenderness. No hernia. Normal bowel sounds. No abdominal scars.  Extremities: Good strength and ROM in upper and lower extremities.   Assessment & Plan  1.  BREAST CANCER, STAGE 2, LEFT  This was originally found on a PET scan when she was being evaluated for the vulvar cancer.  Story: left breast on 02/18/2020 - UIQ, 11 o'clock - IDC, ER - 95%, PR - 95%, Ki67 - 75%, Her2Neu - positive, positive axillary lymph node  Oncology - [facility] and [place]  She received partial  chemotx, but stopped because of toxicity  Plan:   1. Left mastectomy with targeted left axillary node dissection (seed localization)   2. stop xarelto 5 days before surgery  2.  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 Denman George and Dr. Kennon Rounds  saw Dr. Margaretmary Bayley at [facility]  She completed pelvic/vulvar rad therapy - Dr. Sondra Come - in September 3. THROMBOSIS OF RIGHT JUGULAR VEIN (I82.890) 4.  ANTICOAGULATED (Z79.01)  Story: On Xarelto for IJ thrombosis diagnosed on 04/11/2020 by CT scan 5.  SMOKES  6.  MUTATION IN BRIP1 GENE  This carries a risk for ovarian cancer 7. History of bipolar disease and alcohol dependence Sees Dr. Dossie Der Arfeen 8. HTN x 1 year 9. History of kidney stones - seen at [facility] 10. GERD - history of esophageal stricture  Has been seen by Dr. Carlean Purl 11. Obese - BMI - 35   Alphonsa Overall, MD, [facility] Office phone:  [phone]

## 2020-07-27 ENCOUNTER — Encounter (HOSPITAL_COMMUNITY): Payer: Self-pay | Admitting: Surgery

## 2020-07-27 ENCOUNTER — Observation Stay (HOSPITAL_COMMUNITY)
Admission: RE | Admit: 2020-07-27 | Discharge: 2020-07-27 | Disposition: A | Discharge status: Home / Self Care | Payer: Self-pay | Source: Ambulatory Visit | Attending: Surgery | Admitting: Surgery

## 2020-07-27 ENCOUNTER — Observation Stay (HOSPITAL_COMMUNITY)
Admission: RE | Admit: 2020-07-27 | Discharge: 2020-07-28 | Disposition: A | Discharge status: Home / Self Care | Payer: Self-pay | Attending: Surgery | Admitting: Surgery

## 2020-07-27 ENCOUNTER — Other Ambulatory Visit: Payer: Self-pay

## 2020-07-27 ENCOUNTER — Ambulatory Visit
Admission: RE | Admit: 2020-07-27 | Discharge: 2020-07-27 | Disposition: A | Discharge status: Home / Self Care | Payer: Self-pay | Source: Ambulatory Visit | Attending: Surgery | Admitting: Surgery

## 2020-07-27 ENCOUNTER — Ambulatory Visit (HOSPITAL_COMMUNITY): Payer: Self-pay | Admitting: Anesthesiology

## 2020-07-27 ENCOUNTER — Ambulatory Visit (HOSPITAL_COMMUNITY): Payer: Self-pay | Admitting: Physician Assistant

## 2020-07-27 ENCOUNTER — Encounter (HOSPITAL_COMMUNITY)
Admission: RE | Disposition: A | Discharge status: Home / Self Care | Payer: Self-pay | Source: Home / Self Care | Attending: Surgery

## 2020-07-27 DIAGNOSIS — Z79899 Other long term (current) drug therapy: Secondary | ICD-10-CM | POA: Insufficient documentation

## 2020-07-27 DIAGNOSIS — F17210 Nicotine dependence, cigarettes, uncomplicated: Secondary | ICD-10-CM | POA: Insufficient documentation

## 2020-07-27 DIAGNOSIS — I10 Essential (primary) hypertension: Secondary | ICD-10-CM | POA: Insufficient documentation

## 2020-07-27 DIAGNOSIS — C50912 Malignant neoplasm of unspecified site of left female breast: Secondary | ICD-10-CM

## 2020-07-27 DIAGNOSIS — Z7901 Long term (current) use of anticoagulants: Secondary | ICD-10-CM | POA: Insufficient documentation

## 2020-07-27 DIAGNOSIS — C519 Malignant neoplasm of vulva, unspecified: Secondary | ICD-10-CM | POA: Insufficient documentation

## 2020-07-27 DIAGNOSIS — C50012 Malignant neoplasm of nipple and areola, left female breast: Principal | ICD-10-CM | POA: Insufficient documentation

## 2020-07-27 HISTORY — PX: MASTECTOMY WITH AXILLARY LYMPH NODE DISSECTION: SHX5661

## 2020-07-27 LAB — POCT PREGNANCY, URINE: Preg Test, Ur: NEGATIVE

## 2020-07-27 SURGERY — MASTECTOMY WITH AXILLARY LYMPH NODE DISSECTION
Anesthesia: General | Site: Breast | Laterality: Left

## 2020-07-27 MED ORDER — LABETALOL HCL 5 MG/ML IV SOLN
INTRAVENOUS | Status: DC | PRN
  Administered 2020-07-27 (×2): 5 mg via INTRAVENOUS

## 2020-07-27 MED ORDER — DEXAMETHASONE SODIUM PHOSPHATE 10 MG/ML IJ SOLN
INTRAMUSCULAR | Status: AC
  Filled 2020-07-27: qty 1

## 2020-07-27 MED ORDER — PROPOFOL 10 MG/ML IV BOLUS
INTRAVENOUS | Status: AC
  Filled 2020-07-27: qty 20

## 2020-07-27 MED ORDER — VENLAFAXINE HCL ER 75 MG PO CP24
112.5000 mg | ORAL_CAPSULE | Freq: Every day | ORAL | Status: DC
  Administered 2020-07-27: 112.5 mg via ORAL
  Filled 2020-07-27 (×2): qty 1

## 2020-07-27 MED ORDER — LISINOPRIL 20 MG PO TABS: 20.0000 mg | ORAL_TABLET | Freq: Every day | ORAL | Status: DC

## 2020-07-27 MED ORDER — PROPOFOL 10 MG/ML IV BOLUS
INTRAVENOUS | Status: DC | PRN
  Administered 2020-07-27: 200 mg via INTRAVENOUS

## 2020-07-27 MED ORDER — MIDAZOLAM HCL 2 MG/2ML IJ SOLN
INTRAMUSCULAR | Status: AC
  Filled 2020-07-27: qty 2

## 2020-07-27 MED ORDER — OXYCODONE HCL 5 MG/5ML PO SOLN: 5.0000 mg | Freq: Once | ORAL | Status: DC | PRN

## 2020-07-27 MED ORDER — BUPIVACAINE LIPOSOME 1.3 % IJ SUSP
INTRAMUSCULAR | Status: DC | PRN
  Administered 2020-07-27: 10 mL

## 2020-07-27 MED ORDER — FENTANYL CITRATE (PF) 250 MCG/5ML IJ SOLN
INTRAMUSCULAR | Status: DC | PRN
  Administered 2020-07-27: 25 ug via INTRAVENOUS
  Administered 2020-07-27: 50 ug via INTRAVENOUS
  Administered 2020-07-27: 25 ug via INTRAVENOUS
  Administered 2020-07-27 (×2): 50 ug via INTRAVENOUS

## 2020-07-27 MED ORDER — BUPIVACAINE HCL (PF) 0.5 % IJ SOLN
INTRAMUSCULAR | Status: DC | PRN
  Administered 2020-07-27: 30 mg

## 2020-07-27 MED ORDER — CHLORHEXIDINE GLUCONATE 0.12 % MT SOLN
15.0000 mL | Freq: Once | OROMUCOSAL | Status: AC
  Administered 2020-07-27: 15 mL via OROMUCOSAL
  Filled 2020-07-27: qty 15

## 2020-07-27 MED ORDER — OXYCODONE HCL 5 MG PO TABS: 5.0000 mg | ORAL_TABLET | Freq: Once | ORAL | Status: DC | PRN

## 2020-07-27 MED ORDER — FENTANYL CITRATE (PF) 100 MCG/2ML IJ SOLN
25.0000 ug | INTRAMUSCULAR | Status: DC | PRN
  Administered 2020-07-27: 25 ug via INTRAVENOUS

## 2020-07-27 MED ORDER — LACTATED RINGERS IV SOLN: INTRAVENOUS | Status: DC

## 2020-07-27 MED ORDER — AMISULPRIDE (ANTIEMETIC) 5 MG/2ML IV SOLN
10.0000 mg | Freq: Once | INTRAVENOUS | Status: AC
  Administered 2020-07-27: 10 mg via INTRAVENOUS

## 2020-07-27 MED ORDER — TRAMADOL HCL 50 MG PO TABS
50.0000 mg | ORAL_TABLET | Freq: Four times a day (QID) | ORAL | Status: DC | PRN
  Administered 2020-07-27 – 2020-07-28 (×3): 50 mg via ORAL
  Filled 2020-07-27 (×4): qty 1

## 2020-07-27 MED ORDER — ONDANSETRON HCL 4 MG/2ML IJ SOLN
INTRAMUSCULAR | Status: DC | PRN
  Administered 2020-07-27: 4 mg via INTRAVENOUS

## 2020-07-27 MED ORDER — ONDANSETRON HCL 4 MG/2ML IJ SOLN
INTRAMUSCULAR | Status: AC
  Filled 2020-07-27: qty 2

## 2020-07-27 MED ORDER — ACETAMINOPHEN 325 MG PO TABS: 325.0000 mg | ORAL_TABLET | ORAL | Status: DC | PRN

## 2020-07-27 MED ORDER — ONDANSETRON HCL 4 MG/2ML IJ SOLN
4.0000 mg | Freq: Once | INTRAMUSCULAR | Status: AC | PRN
  Administered 2020-07-27: 4 mg via INTRAVENOUS

## 2020-07-27 MED ORDER — PROPOFOL 500 MG/50ML IV EMUL
INTRAVENOUS | Status: DC | PRN
  Administered 2020-07-27: 25 ug/kg/min via INTRAVENOUS

## 2020-07-27 MED ORDER — ONDANSETRON HCL 4 MG/2ML IJ SOLN: 4.0000 mg | Freq: Four times a day (QID) | INTRAMUSCULAR | Status: DC | PRN

## 2020-07-27 MED ORDER — LORAZEPAM 1 MG PO TABS
1.0000 mg | ORAL_TABLET | Freq: Three times a day (TID) | ORAL | Status: DC | PRN
  Administered 2020-07-27 – 2020-07-28 (×2): 1 mg via ORAL
  Filled 2020-07-27 (×2): qty 1

## 2020-07-27 MED ORDER — MIDAZOLAM HCL 2 MG/2ML IJ SOLN
INTRAMUSCULAR | Status: AC
  Administered 2020-07-27: 2 mg via INTRAVENOUS
  Filled 2020-07-27: qty 2

## 2020-07-27 MED ORDER — VENLAFAXINE HCL ER 75 MG PO CP24
112.5000 mg | ORAL_CAPSULE | Freq: Every day | ORAL | Status: DC
  Filled 2020-07-27: qty 1

## 2020-07-27 MED ORDER — MIDAZOLAM HCL 2 MG/2ML IJ SOLN: 2.0000 mg | Freq: Once | INTRAMUSCULAR | Status: AC

## 2020-07-27 MED ORDER — FENTANYL CITRATE (PF) 100 MCG/2ML IJ SOLN
INTRAMUSCULAR | Status: AC
  Filled 2020-07-27: qty 2

## 2020-07-27 MED ORDER — PANTOPRAZOLE SODIUM 40 MG PO TBEC
40.0000 mg | DELAYED_RELEASE_TABLET | Freq: Every day | ORAL | Status: DC
  Administered 2020-07-27: 40 mg via ORAL
  Filled 2020-07-27: qty 1

## 2020-07-27 MED ORDER — PROCHLORPERAZINE EDISYLATE 10 MG/2ML IJ SOLN
10.0000 mg | Freq: Once | INTRAMUSCULAR | Status: AC | PRN
  Administered 2020-07-27: 10 mg via INTRAVENOUS

## 2020-07-27 MED ORDER — ACETAMINOPHEN 160 MG/5ML PO SOLN: 325.0000 mg | ORAL | Status: DC | PRN

## 2020-07-27 MED ORDER — FENTANYL CITRATE (PF) 250 MCG/5ML IJ SOLN
INTRAMUSCULAR | Status: AC
  Filled 2020-07-27: qty 5

## 2020-07-27 MED ORDER — PHENYLEPHRINE 40 MCG/ML (10ML) SYRINGE FOR IV PUSH (FOR BLOOD PRESSURE SUPPORT)
PREFILLED_SYRINGE | INTRAVENOUS | Status: AC
  Filled 2020-07-27: qty 20

## 2020-07-27 MED ORDER — MIDAZOLAM HCL 5 MG/5ML IJ SOLN
INTRAMUSCULAR | Status: DC | PRN
  Administered 2020-07-27 (×2): 1 mg via INTRAVENOUS

## 2020-07-27 MED ORDER — 0.9 % SODIUM CHLORIDE (POUR BTL) OPTIME
TOPICAL | Status: DC | PRN
  Administered 2020-07-27: 1000 mL

## 2020-07-27 MED ORDER — CHLORHEXIDINE GLUCONATE 4 % EX LIQD: 60.0000 mL | Freq: Once | CUTANEOUS | Status: DC

## 2020-07-27 MED ORDER — DEXAMETHASONE SODIUM PHOSPHATE 10 MG/ML IJ SOLN
INTRAMUSCULAR | Status: DC | PRN
  Administered 2020-07-27: 10 mg via INTRAVENOUS

## 2020-07-27 MED ORDER — VENLAFAXINE HCL ER 37.5 MG PO CP24
37.5000 mg | ORAL_CAPSULE | Freq: Every day | ORAL | Status: DC
  Filled 2020-07-27: qty 1

## 2020-07-27 MED ORDER — PHENYLEPHRINE HCL (PRESSORS) 10 MG/ML IV SOLN
INTRAVENOUS | Status: DC | PRN
  Administered 2020-07-27: 80 ug via INTRAVENOUS

## 2020-07-27 MED ORDER — TECHNETIUM TC 99M TILMANOCEPT KIT
1.0000 | PACK | Freq: Once | INTRAVENOUS | Status: AC | PRN
  Administered 2020-07-27: 1 via INTRADERMAL

## 2020-07-27 MED ORDER — MEPERIDINE HCL 25 MG/ML IJ SOLN: 6.2500 mg | INTRAMUSCULAR | Status: DC | PRN

## 2020-07-27 MED ORDER — MORPHINE SULFATE (PF) 2 MG/ML IV SOLN: 1.0000 mg | INTRAVENOUS | Status: DC | PRN

## 2020-07-27 MED ORDER — ONDANSETRON 4 MG PO TBDP: 4.0000 mg | ORAL_TABLET | Freq: Four times a day (QID) | ORAL | Status: DC | PRN

## 2020-07-27 MED ORDER — HYDROCODONE-ACETAMINOPHEN 5-325 MG PO TABS: 1.0000 | ORAL_TABLET | ORAL | Status: DC | PRN

## 2020-07-27 MED ORDER — ACETAMINOPHEN 500 MG PO TABS: 1000.0000 mg | ORAL_TABLET | ORAL | Status: DC

## 2020-07-27 MED ORDER — KCL IN DEXTROSE-NACL 20-5-0.45 MEQ/L-%-% IV SOLN: INTRAVENOUS | Status: DC

## 2020-07-27 MED ORDER — BUPIVACAINE HCL (PF) 0.25 % IJ SOLN
INTRAMUSCULAR | Status: AC
  Filled 2020-07-27: qty 30

## 2020-07-27 MED ORDER — AMISULPRIDE (ANTIEMETIC) 5 MG/2ML IV SOLN
INTRAVENOUS | Status: AC
  Filled 2020-07-27: qty 4

## 2020-07-27 MED ORDER — FENTANYL CITRATE (PF) 100 MCG/2ML IJ SOLN: 100.0000 ug | Freq: Once | INTRAMUSCULAR | Status: AC

## 2020-07-27 MED ORDER — SCOPOLAMINE 1 MG/3DAYS TD PT72
1.0000 | MEDICATED_PATCH | TRANSDERMAL | Status: DC
  Administered 2020-07-27: 1.5 mg via TRANSDERMAL
  Filled 2020-07-27: qty 1

## 2020-07-27 MED ORDER — CEFAZOLIN SODIUM-DEXTROSE 2-4 GM/100ML-% IV SOLN
2.0000 g | INTRAVENOUS | Status: AC
  Administered 2020-07-27: 2 g via INTRAVENOUS
  Filled 2020-07-27: qty 100

## 2020-07-27 MED ORDER — OLANZAPINE 5 MG PO TABS
10.0000 mg | ORAL_TABLET | Freq: Every day | ORAL | Status: DC
  Administered 2020-07-27: 10 mg via ORAL
  Filled 2020-07-27: qty 2

## 2020-07-27 MED ORDER — ORAL CARE MOUTH RINSE: 15.0000 mL | Freq: Once | OROMUCOSAL | Status: AC

## 2020-07-27 MED ORDER — FENTANYL CITRATE (PF) 100 MCG/2ML IJ SOLN
INTRAMUSCULAR | Status: AC
  Administered 2020-07-27: 100 ug via INTRAVENOUS
  Filled 2020-07-27: qty 2

## 2020-07-27 MED ORDER — KETAMINE HCL 50 MG/5ML IJ SOSY
PREFILLED_SYRINGE | INTRAMUSCULAR | Status: AC
  Filled 2020-07-27: qty 5

## 2020-07-27 MED ORDER — ACETAMINOPHEN 500 MG PO TABS
1000.0000 mg | ORAL_TABLET | Freq: Once | ORAL | Status: AC
  Administered 2020-07-27: 1000 mg via ORAL
  Filled 2020-07-27: qty 2

## 2020-07-27 MED ORDER — PROCHLORPERAZINE MALEATE 10 MG PO TABS: 10.0000 mg | ORAL_TABLET | Freq: Four times a day (QID) | ORAL | Status: DC | PRN

## 2020-07-27 MED ORDER — KETAMINE HCL 10 MG/ML IJ SOLN
INTRAMUSCULAR | Status: DC | PRN
  Administered 2020-07-27: 20 mg via INTRAVENOUS

## 2020-07-27 MED ORDER — PROCHLORPERAZINE EDISYLATE 10 MG/2ML IJ SOLN
INTRAMUSCULAR | Status: AC
  Filled 2020-07-27: qty 2

## 2020-07-27 MED ORDER — ENOXAPARIN SODIUM 40 MG/0.4ML ~~LOC~~ SOLN
40.0000 mg | SUBCUTANEOUS | Status: DC
  Administered 2020-07-28: 40 mg via SUBCUTANEOUS
  Filled 2020-07-27: qty 0.4

## 2020-07-27 MED ORDER — PROCHLORPERAZINE MALEATE 10 MG PO TABS
10.0000 mg | ORAL_TABLET | Freq: Four times a day (QID) | ORAL | Status: DC | PRN
  Filled 2020-07-27: qty 1

## 2020-07-27 SURGICAL SUPPLY — 60 items
ADH SKN CLS APL DERMABOND .7 (GAUZE/BANDAGES/DRESSINGS) ×2
APPLIER CLIP 9.375 MED OPEN (MISCELLANEOUS) ×2
APR CLP MED 9.3 20 MLT OPN (MISCELLANEOUS) ×1
ATCH SMKEVC FLXB CAUT HNDSWH (FILTER) ×1 IMPLANT
BINDER BREAST LRG (GAUZE/BANDAGES/DRESSINGS) IMPLANT
BINDER BREAST XLRG (GAUZE/BANDAGES/DRESSINGS) ×1 IMPLANT
BIOPATCH RED 1 DISK 7.0 (GAUZE/BANDAGES/DRESSINGS) ×2 IMPLANT
CANISTER SUCT 3000ML PPV (MISCELLANEOUS) ×2 IMPLANT
CLIP APPLIE 9.375 MED OPEN (MISCELLANEOUS) ×1 IMPLANT
COVER PROBE W GEL 5X96 (DRAPES) ×1 IMPLANT
COVER SURGICAL LIGHT HANDLE (MISCELLANEOUS) ×2 IMPLANT
COVER WAND RF STERILE (DRAPES) ×2 IMPLANT
DERMABOND ADVANCED (GAUZE/BANDAGES/DRESSINGS) ×2
DERMABOND ADVANCED .7 DNX12 (GAUZE/BANDAGES/DRESSINGS) ×1 IMPLANT
DRAIN CHANNEL 19F RND (DRAIN) ×4 IMPLANT
DRAPE CHEST BREAST 15X10 FENES (DRAPES) ×2 IMPLANT
DRAPE HALF SHEET 40X57 (DRAPES) ×2 IMPLANT
DRSG PAD ABDOMINAL 8X10 ST (GAUZE/BANDAGES/DRESSINGS) ×4 IMPLANT
DRSG TEGADERM 4X4.75 (GAUZE/BANDAGES/DRESSINGS) ×1 IMPLANT
ELECT CAUTERY BLADE 6.4 (BLADE) ×2 IMPLANT
ELECT REM PT RETURN 9FT ADLT (ELECTROSURGICAL) ×2
ELECTRODE REM PT RTRN 9FT ADLT (ELECTROSURGICAL) ×2 IMPLANT
EVACUATOR SILICONE 100CC (DRAIN) ×4 IMPLANT
EVACUATOR SMOKE ACCUVAC VALLEY (FILTER) ×2
GAUZE SPONGE 4X4 12PLY STRL (GAUZE/BANDAGES/DRESSINGS) ×2 IMPLANT
GLOVE BIOGEL PI IND STRL 6.5 (GLOVE) IMPLANT
GLOVE BIOGEL PI IND STRL 7.0 (GLOVE) IMPLANT
GLOVE BIOGEL PI INDICATOR 6.5 (GLOVE) ×1
GLOVE BIOGEL PI INDICATOR 7.0 (GLOVE) ×1
GLOVE ECLIPSE 7.0 STRL STRAW (GLOVE) ×1 IMPLANT
GLOVE SURG SS PI 6.5 STRL IVOR (GLOVE) ×1 IMPLANT
GLOVE SURG SYN 7.5  E (GLOVE) ×2
GLOVE SURG SYN 7.5 E (GLOVE) ×1 IMPLANT
GLOVE SURG SYN 7.5 PF PI (GLOVE) ×1 IMPLANT
GOWN STRL REUS W/ TWL LRG LVL3 (GOWN DISPOSABLE) ×1 IMPLANT
GOWN STRL REUS W/ TWL XL LVL3 (GOWN DISPOSABLE) ×1 IMPLANT
GOWN STRL REUS W/TWL LRG LVL3 (GOWN DISPOSABLE) ×4
GOWN STRL REUS W/TWL XL LVL3 (GOWN DISPOSABLE) ×2
ILLUMINATOR WAVEGUIDE N/F (MISCELLANEOUS) IMPLANT
KIT BASIN OR (CUSTOM PROCEDURE TRAY) ×2 IMPLANT
KIT MARKER MARGIN INK (KITS) ×1 IMPLANT
KIT TURNOVER KIT B (KITS) ×2 IMPLANT
LIGHT WAVEGUIDE WIDE FLAT (MISCELLANEOUS) IMPLANT
NS IRRIG 1000ML POUR BTL (IV SOLUTION) ×3 IMPLANT
PACK GENERAL/GYN (CUSTOM PROCEDURE TRAY) ×2 IMPLANT
PAD ARMBOARD 7.5X6 YLW CONV (MISCELLANEOUS) ×2 IMPLANT
PENCIL SMOKE EVACUATOR (MISCELLANEOUS) ×2 IMPLANT
PREFILTER EVAC NS 1 1/3-3/8IN (MISCELLANEOUS) ×2 IMPLANT
SPECIMEN JAR X LARGE (MISCELLANEOUS) ×2 IMPLANT
SPONGE LAP 18X18 RF (DISPOSABLE) IMPLANT
STAPLER VISISTAT 35W (STAPLE) ×1 IMPLANT
SUT ETHILON 2 0 FS 18 (SUTURE) ×3 IMPLANT
SUT MNCRL AB 4-0 PS2 18 (SUTURE) ×3 IMPLANT
SUT SILK 2 0 SH (SUTURE) ×1 IMPLANT
SUT SILK 3 0 (SUTURE)
SUT SILK 3-0 18XBRD TIE 12 (SUTURE) IMPLANT
SUT VIC AB 3-0 SH 18 (SUTURE) ×2 IMPLANT
TOWEL GREEN STERILE (TOWEL DISPOSABLE) ×2 IMPLANT
TOWEL GREEN STERILE FF (TOWEL DISPOSABLE) ×2 IMPLANT
TUBE CONNECTING 12X1/4 (SUCTIONS) ×2 IMPLANT

## 2020-07-27 NOTE — Anesthesia Procedure Notes (Signed)
Procedure Name: LMA Insertion Date/Time: 07/27/2020 2:24 PM Performed by: Cleda Daub, CRNA Pre-anesthesia Checklist: Patient identified, Emergency Drugs available, Suction available and Patient being monitored Patient Re-evaluated:Patient Re-evaluated prior to induction Oxygen Delivery Method: Circle system utilized Preoxygenation: Pre-oxygenation with 100% oxygen Induction Type: IV induction LMA: LMA inserted LMA Size: 4.0 Number of attempts: 1 Placement Confirmation: positive ETCO2 Tube secured with: Tape Dental Injury: Teeth and Oropharynx as per pre-operative assessment

## 2020-07-27 NOTE — Interval H&P Note (Signed)
History and Physical Interval Note:  07/27/2020 1:44 PM  Sandra Miller  has presented today for surgery, with the diagnosis of LEFT BREAST CANCER.  The various methods of treatment have been discussed with the patient and family.   Her daughter is here with her.  Her seed is in good location.  After consideration of risks, benefits and other options for treatment, the patient has consented to  Procedure(s): LEFT MASTECTOMY WITH LEFT RADIOACTIVE [place] (Left) as a surgical intervention.  The patient's history has been reviewed, patient examined, no change in status, stable for surgery.  I have reviewed the patient's chart and labs.  Questions were answered to the patient's satisfaction.     Shann Medal

## 2020-07-27 NOTE — Anesthesia Preprocedure Evaluation (Addendum)
Anesthesia Evaluation  Patient identified by MRN, date of birth, ID band Patient awake    Reviewed: Allergy & Precautions, NPO status , Patient's Chart, lab work & pertinent test results  History of Anesthesia Complications (+) PONV and history of anesthetic complications  Airway Mallampati: II  TM Distance: >3 FB Neck ROM: Full    Dental no notable dental hx.    Pulmonary Current Smoker,    Pulmonary exam normal        Cardiovascular hypertension, Pt. on medications Normal cardiovascular exam     Neuro/Psych Anxiety Bipolar Disorder negative neurological ROS     GI/Hepatic Neg liver ROS, hiatal hernia, GERD  Medicated,  Endo/Other  negative endocrine ROS  Renal/GU negative Renal ROS  negative genitourinary   Musculoskeletal negative musculoskeletal ROS (+)   Abdominal   Peds negative pediatric ROS (+)  Hematology negative hematology ROS (+)   Anesthesia Other Findings   Reproductive/Obstetrics negative OB ROS                            Anesthesia Physical  Anesthesia Plan  ASA: III  Anesthesia Plan: General   Post-op Pain Management:  Regional for Post-op pain   Induction: Intravenous  PONV Risk Score and Plan: 4 or greater and Ondansetron, Treatment may vary due to age or medical condition, Dexamethasone, Midazolam and Scopolamine patch - Pre-op  Airway Management Planned: LMA  Additional Equipment:   Intra-op Plan:   Post-operative Plan: Extubation in OR  Informed Consent: I have reviewed the patients History and Physical, chart, labs and discussed the procedure including the risks, benefits and alternatives for the proposed anesthesia with the patient or authorized representative who has indicated his/her understanding and acceptance.     Dental advisory given  Plan Discussed with: Anesthesiologist, CRNA and Surgeon  Anesthesia Plan Comments:         Anesthesia Quick Evaluation

## 2020-07-27 NOTE — Anesthesia Postprocedure Evaluation (Signed)
Anesthesia Post Note  Patient: Sandra Miller, Sandra Miller  Procedure(s) Performed: LEFT MASTECTOMY WITH LEFT RADIOACTIVE SEED GUIDED TARGETED AXILLARY LYMPH NODE DISSECTION (Left Breast)     Patient location during evaluation: PACU Anesthesia Type: General Level of consciousness: awake and alert Pain management: pain level controlled Vital Signs Assessment: post-procedure vital signs reviewed and stable Respiratory status: spontaneous breathing, nonlabored ventilation and respiratory function stable Cardiovascular status: blood pressure returned to baseline and stable Postop Assessment: no apparent nausea or vomiting Anesthetic complications: no   No complications documented.  Last Vitals:  Vitals:   07/27/20 1701 07/27/20 1717  BP: 132/86 (!) 128/93  Pulse: 93 95  Resp: 13 16  Temp:    SpO2: 92% 94%    Last Pain:  Vitals:   07/27/20 1701  TempSrc:   PainSc: [place]

## 2020-07-27 NOTE — Transfer of Care (Signed)
Immediate Anesthesia Transfer of Care Note  Patient: Sandra Miller  Procedure(s) Performed: LEFT MASTECTOMY WITH LEFT RADIOACTIVE SEED GUIDED TARGETED AXILLARY LYMPH NODE DISSECTION (Left Breast)  Patient Location: PACU  Anesthesia Type:GA combined with regional for post-op pain  Level of Consciousness: awake, alert , oriented and patient cooperative  Airway & Oxygen Therapy: Patient Spontanous Breathing and Patient connected to face mask oxygen  Post-op Assessment: Report given to RN and Post -op Vital signs reviewed and stable  Post vital signs: Reviewed and stable  Last Vitals:  Vitals Value Taken Time  BP 134/93 07/27/20 1632  Temp    Pulse 88 07/27/20 1638  Resp 24 07/27/20 1638  SpO2 99 % 07/27/20 1638  Vitals shown include unvalidated device data.  Last Pain:  Vitals:   07/27/20 1339  TempSrc:   PainSc: 0-No pain         Complications: No complications documented.

## 2020-07-27 NOTE — Progress Notes (Signed)
Patient arrived to room 6N01 from PACU. Report received from [place] from PACU. Patient alert and oriented x4. PIV 20 g to rt forearm. Press binder intact. Incision to left breast area post mastectomy with glue intact.  Patient oriented to room. Patient did not want the pain medications that were ordered but wanted to wait for the tramadol.  No acute distress noted at this time.

## 2020-07-27 NOTE — Addendum Note (Signed)
Addendum  created 07/27/20 1753 by Audry Pili, MD   Order list changed

## 2020-07-27 NOTE — Op Note (Signed)
07/27/2020  4:01 PM  PATIENT:  Sandra Miller, 44 y.o., female, MRN: [id number]  PREOP DIAGNOSIS:  LEFT BREAST CANCER  POSTOP DIAGNOSIS:   Left breast cancer, 10 o'clock position (T2, N1)  PROCEDURE:   Procedure(s):  LEFT MASTECTOMY WITH LEFT RADIOACTIVE SEED GUIDED TARGETED AXILLARY LYMPH NODE DISSECTION, deep sentinel lymph node biopsy  SURGEON:   Alphonsa Overall, M.D.  ASSISTANT:   None  ANESTHESIA:  General  Anesthesiologist: Audry Pili, MD; Duane Boston, MD CRNA: Cleda Daub, CRNA  General  ASA:  3  EBL:  75  ml  BLOOD ADMINISTERED: none  DRAINS: Two 16 F Blake drain  LOCAL MEDICATIONS USED:   Left pectoral block  SPECIMEN:   Left breast (suture lateral), left axillary targeted node dissection, superior mastectomy flap (short suture inferior, long suture lateral)  COUNTS CORRECT:  YES  INDICATIONS FOR PROCEDURE:  Sandra Miller is a 44 y.o. (DOB: 05-14-1976)  female whose primary care physician is Rutherford Guys, MD (Inactive) and comes for a left mastectomy and targeted left axillary node dissection.   Her left breast cancer was discovered on a PET scan when she was being evaluated for vulvar cancer.  A biopsy on 02/18/2020 an IDC that is PR positive, Her2Neu positive and had a positive left axillary lymph node.   She has undergone neoadjuvant chemotherapy by Dr. Lindi Adie.  On genetic testing, she is BRIP1 positive.  On follow up MRI on 06/24/2020, there was felt to be little change in the primary breast cancer, but her enlarged left axillary lymph nodes had resolved.   The indications and risks of the surgery were explained to the patient.  The risks include, but are not limited to, infection, bleeding, and nerve injury.   She had gone to [facility] on 07/24/2020 where a seed was placed in her left axillar to mark the previously biopsied lymph node.   In the holding area, her left areola was injected with 1 millicurie of Technitium Sulfur  Colloid.  OPERATIVE NOTE;  The patient was taken to OR room # 8 at [facility] where she underwent a general anesthesia  supervised by Anesthesiologist: Audry Pili, MD; Duane Boston, MD CRNA: Cleda Daub, CRNA. Her left breast and axilla were prepped with ChloraPrep and sterilely draped.    A time-out and the surgical check list was reviewed.    I made an elliptical incision including the areola in the left breast.  I developed skin flaps medially to the lateral edge of the sternum, inferiorly to the investing fascia of the rectus abdominus muscle, laterally to the anterior edge of the latissimus dorsi muscle, and superiorly to about 2 finger breaths below the clavicle.  The breast was reflected off the pectoralis muscle from medial to lateral.  The lateral attachments in the left axilla were divided and the breast removed.  A long suture was placed on the lateral aspect of the breast.   I dissected into the left axilla and found a deep sentinel lymph node that had been marked with the radioactive seed..  The same node/fat complex had counts of 300 with a background count of 20.  This was sent as a separate specimen as targeted left axillary node dissection.   I brought out 2 11 F Blake drains below the inferior flaps.  These were sewn in place with 2-0 Nylons.  I irrigated the wound with 2,000 cc of fluid.   The skin was closed with interrupted 3-0 Vicryl sutures  and the skin was closed with a 4-0 Monocryl.  The wound was painted with Dermabond.    A pressure dressing was placed on the wound and the chest wrapped with a breast binder.  Her needle and sponge count were correct at the end of the case.   She was transferred to the recovery room in good condition.  Alphonsa Overall, MD, [facility] Pager: [phone] Office phone:  [phone]

## 2020-07-27 NOTE — Anesthesia Procedure Notes (Signed)
Anesthesia Regional Block: Pectoralis block   Pre-Anesthetic Checklist: ,, timeout performed, Correct Patient, Correct Site, Correct Laterality, Correct Procedure, Correct Position, site marked, Risks and benefits discussed,  Surgical consent,  Pre-op evaluation,  At surgeon's request and post-op pain management  Laterality: Left  Prep: chloraprep       Needles:  Injection technique: Single-shot  Needle Type: Echogenic Stimulator Needle     Needle Length: 10cm  Needle Gauge: 21     Additional Needles:   Narrative:  Start time: 07/27/2020 1:43 PM End time: 07/27/2020 1:53 PM Injection made incrementally with aspirations every 5 mL.  Performed by: Personally  Anesthesiologist: Duane Boston, MD

## 2020-07-28 ENCOUNTER — Encounter (HOSPITAL_COMMUNITY): Payer: Self-pay | Admitting: Surgery

## 2020-07-28 ENCOUNTER — Other Ambulatory Visit (HOSPITAL_COMMUNITY): Payer: Self-pay | Admitting: Surgery

## 2020-07-28 ENCOUNTER — Encounter: Payer: Self-pay | Admitting: *Deleted

## 2020-07-28 MED ORDER — TRAMADOL HCL 50 MG PO TABS: 50.0000 mg | ORAL_TABLET | Freq: Four times a day (QID) | ORAL | 0 refills | Status: DC | PRN

## 2020-07-28 MED FILL — traMADol HCL 50 MG TABS: 50 | 4 days supply | Qty: 15 | Fill #0

## 2020-07-28 NOTE — Discharge Instructions (Signed)
[facility] - DISCHARGE INSTRUCTIONS TO PATIENT  Activity:  Driving - May drive in 2 to 4 days, if doing well   Lifting - No lifting more than 15 pounds for 10 days.                       Practice your Covid-19 protection:  Wear a mask, social distance, and wash your hands frequently  Wound Care:   Leave the incision dry for 2 days, then you may shower  Diet:  As tolerated  Follow up appointment:  Call Dr. Pollie Friar office [facility]) at [phone] for an appointment in 1 week..  Medications and dosages:  Resume your home medications.  You have a prescription for:  Ultram             You may also take Tylenol, ibuprofen, or Aleve for pain             You may resume Louanna Raw tomorrow, Wednesday  Call Dr. [name] or his office  [phone]) if you have:  Temperature greater than 100.4,  Persistent nausea and vomiting,  Severe uncontrolled pain,  Redness, tenderness, or signs of infection (pain, swelling, redness, odor or green/yellow discharge around the site),  Difficulty breathing, headache or visual disturbances,  Any other questions or concerns you may have after discharge.  In an emergency, call 911 or go to an Emergency Department at a nearby hospital.

## 2020-07-28 NOTE — Discharge Summary (Signed)
Physician Discharge Summary  Patient ID:  Sandra Miller  MRN: [id number]  DOB/AGE: [birth year] 46 y.o.  Admit date: 07/27/2020 Discharge date: 07/28/2020  Discharge Diagnoses:  1.  BREAST CANCER, STAGE 2, LEFT             Story: left breast biopsy on 02/18/2020 - UIQ, 11 o'clock - IDC, ER - 95%, PR - 95%, Ki67 - 75%, Her2Neu - positive, positive axillary lymph node             Oncology - [facility]  2.  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 Denman George and Dr. Kennon Rounds        She completed pelvic/vulvar rad therapy - Dr. Sondra Come - in September 3. THROMBOSIS OF RIGHT JUGULAR VEIN  4.  ANTICOAGULATED (Z79.01) 5.  SMOKES  6.  MUTATION IN BRIP1 GENE 7. History of bipolar disease and alcohol dependence  Sees Dr. Dossie Der Arfeen 8. HTN x 1 year 9. History of kidney stones - seen at [facility] 10. GERD - history of esophageal stricture         Has been seen by Dr. Carlean Purl 11. Obese - BMI - 35   Active Problems:   Breast cancer, stage 2, left ([place])  Operation: Procedure(s):  LEFT MASTECTOMY WITH LEFT RADIOACTIVE SEED GUIDED TARGETED AXILLARY LYMPH NODE DISSECTION on 07/27/2020 [name]  Discharged Condition: good  Hospital Course: Sandra Miller is an 44 y.o. female whose primary care physician is Sandra Guys, MD (Inactive) and who was admitted 07/27/2020 with a chief complaint of No chief complaint on file. Marland Kitchen   She was brought to the operating room on 07/27/2020 and underwent LEFT MASTECTOMY WITH LEFT RADIOACTIVE SEED GUIDED TARGETED AXILLARY LYMPH NODE DISSECTION.   She is doing better today and ready to go home.  The discharge instructions were reviewed with the patient.  Consults: None  Significant Diagnostic Studies: Results for orders placed or performed during the hospital encounter of 07/27/20  Pregnancy, urine POC  Result Value Ref Range   Preg Test, Ur NEGATIVE NEGATIVE    NM Sentinel Node  Inj-No Rpt (Breast)  Result Date: 07/27/2020 Sulfur colloid was injected by the nuclear medicine technologist for melanoma sentinel node.   MM Breast Surgical Specimen  Result Date: 07/27/2020 CLINICAL DATA:  Status post excisional biopsy of the left breast. EXAM: SPECIMEN RADIOGRAPH OF THE LEFT BREAST COMPARISON:  Previous exam(s). FINDINGS: Status post excision of the left breast. The radioactive seed and biopsy marker clip are present, completely intact, and were marked for pathology. IMPRESSION: Specimen radiograph of the left breast. Electronically Signed   By: Lillia Mountain M.D.   On: 07/27/2020 15:22   [place] LT RADIOACTIVE SEED LOC  Result Date: 07/24/2020 CLINICAL DATA:  Patient for preoperative localization of left axillary lymph node prior to left mastectomy and targeted lymph node dissection. EXAM: ULTRASOUND GUIDED RADIOACTIVE SEED LOCALIZATION OF THE LEFT BREAST COMPARISON:  Previous exam(s). FINDINGS: Patient presents for radioactive seed localization prior to left mastectomy and targeted lymph node resection. I met with the patient and we discussed the procedure of seed localization including benefits and alternatives. We discussed the high likelihood of a successful procedure. We discussed the risks of the procedure including infection, bleeding, tissue injury and further surgery. We discussed the low dose of radioactivity involved in the procedure. Informed, written consent  was given. The usual time-out protocol was performed immediately prior to the procedure. Using ultrasound guidance, sterile technique, 1% lidocaine and an I-125 radioactive seed, left axillary lymph node was localized using a lateral approach. The follow-up mammogram images confirm the seed in the expected location and were marked for Dr. [name]. Follow-up survey of the patient confirms presence of the radioactive seed. Order number of I-125 seed:  [id number]. Total activity:  3.382 millicuries reference Date: 07/15/2020  The patient tolerated the procedure well and was released from the [place]. She was given instructions regarding seed removal. IMPRESSION: Radioactive seed localization left breast. No apparent complications. Electronically Signed   By: Lovey Newcomer M.D.   On: 07/24/2020 14:17   MM CLIP PLACEMENT LEFT  Result Date: 07/24/2020 CLINICAL DATA:  Patient for preoperative localization prior to left targeted lymph node dissection and left mastectomy EXAM: DIAGNOSTIC LEFT MAMMOGRAM POST ULTRASOUND-GUIDED RADIOACTIVE SEED PLACEMENT COMPARISON:  Previous exam(s). FINDINGS: Mammographic images were obtained following ultrasound-guided radioactive seed placement. These demonstrate radioactive seed to be appropriately located adjacent to the biopsy clip within the left axilla. IMPRESSION: Appropriate location of the radioactive seed. Final Assessment: Post Procedure Mammograms for Seed Placement Electronically Signed   By: Lovey Newcomer M.D.   On: 07/24/2020 14:18    Discharge Exam:  Vitals:   07/28/20 0212 07/28/20 0606  BP: 110/87 110/85  Pulse: 90 93  Resp: 16 18  Temp: 98.2 F (36.8 C) 98.1 F (36.7 C)  SpO2: 97% 98%    General: WN F who is alert and generally healthy appearing.  Lungs: Clear to auscultation and symmetric breath sounds. Chest:   Her incision looks good.   Drain 1/2 - 50/10 cc Extremities:  Good strength and ROM  in upper and lower extremities.   Discharge Medications:   Allergies as of 07/28/2020      Reactions   Dilaudid [hydromorphone Hcl] Other (See Comments)   Pt became confused, pulled out iv's, does not remember anything   Depakote [divalproex Sodium] Nausea And Vomiting   Minocycline Hives   Aspirin Hives   States able to tolerate Goody Powders without any problem except for GI upset      Medication List    TAKE these medications   acetaminophen 500 MG tablet Commonly known as: TYLENOL Take 2 tablets (1,000 mg total) by mouth every 6 (six) hours as needed  for moderate pain or fever. Avoid tylenol until your liver function back to normal   diphenoxylate-atropine 2.5-0.025 MG tablet Commonly known as: LOMOTIL TAKE 1 TO 2 TABLETS BY MOUTH FOUR TIMES DAILY AS NEEDED FOR DIARRHEA What changed:   how much to take  how to take this  when to take this  reasons to take this   dronabinol 2.5 MG capsule Commonly known as: MARINOL Take 1 capsule (2.5 mg total) by mouth 2 (two) times daily before a meal. What changed:   when to take this  reasons to take this   gabapentin 300 MG capsule Commonly known as: NEURONTIN Take 1 capsule (300 mg total) by mouth daily.   lidocaine-prilocaine cream Commonly known as: EMLA Apply 1 application topically daily as needed (prior to port access).   lisinopril 20 MG tablet Commonly known as: ZESTRIL Take 1 tablet (20 mg total) by mouth daily.   LORazepam 1 MG tablet Commonly known as: ATIVAN Take 1 tablet (1 mg total) by mouth 3 (three) times daily as needed for anxiety.   magnesium oxide 400 (241.3 Mg) MG  tablet Commonly known as: MAG-OX Take 1 tablet (400 mg total) by mouth daily.   multivitamin with minerals Tabs tablet Take 1 tablet by mouth daily.   nicotine 21 mg/24hr patch Commonly known as: NICODERM CQ - dosed in mg/24 hours Place 1 patch (21 mg total) onto the skin daily.   OLANZapine 10 MG tablet Commonly known as: ZYPREXA Take 1 tablet (10 mg total) by mouth at bedtime.   omeprazole 40 MG capsule Commonly known as: PRILOSEC TAKE 1 CAPSULE BY MOUTH TWO TIMES DAILY BEFORE A MEAL What changed:   how much to take  how to take this  when to take this   potassium chloride SA 20 MEQ tablet Commonly known as: KLOR-CON Take 1 tablet (20 mEq total) by mouth daily.   prochlorperazine 10 MG tablet Commonly known as: COMPAZINE TAKE 1 TABLET BY MOUTH EVERY 6 HOURS AS NEEDED FOR NAUSEA OR VOMITING What changed: See the new instructions.   promethazine 25 MG tablet Commonly  known as: PHENERGAN Take 1 tablet (25 mg total) by mouth every 6 (six) hours as needed for nausea.   rivaroxaban 20 MG Tabs tablet Commonly known as: XARELTO Take 1 tablet (20 mg total) by mouth daily with supper.   traMADol 50 MG tablet Commonly known as: ULTRAM Take 1 tablet (50 mg total) by mouth every 6 (six) hours as needed for moderate pain or severe pain (mild pain).   venlafaxine XR 37.5 MG 24 hr capsule Commonly known as: Effexor XR Take three capsule (112.5 mg total) by mouth daily.       Disposition: Discharge disposition: 01-Home or Self Care       Discharge Instructions    Diet - low sodium heart healthy   Complete by: As directed    Increase activity slowly   Complete by: As directed        Signed: Alphonsa Overall, M.D., [facility] Surgery Office:  [phone]  07/28/2020, 7:20 AM

## 2020-07-31 LAB — SURGICAL PATHOLOGY

## 2020-08-03 ENCOUNTER — Other Ambulatory Visit: Payer: Self-pay | Admitting: Internal Medicine

## 2020-08-03 MED FILL — OMEPRAZOLE 40 MG CPDR: 40 | 90 days supply | Qty: 180 | Fill #0

## 2020-08-03 MED FILL — POTASSIUM CHLORIDE CRYS ER: 20 | 30 days supply | Qty: 60 | Fill #2

## 2020-08-03 NOTE — Assessment & Plan Note (Signed)
Breast cancer and vulvar cancers  02/20/2020:PET scan on 02/07/20 following a diagnosis of vulvar cancer showed a 1.7cm left breast mass and mildly hypermetabolic left axillary lymph nodes. Mammogram and [place] on 02/18/20 showed a 3.1cm left breast mass at the 11 o'clock position, palpable on exam, and a single abnormal left axillary lymph node with cortical thickening. Left breast biopsy on 02/20/20 showed invasive and in situ carcinoma in the breast and axilla, grade 3, HER-2 positive (3+), ER/PR+ 95%, Ki67 75%  Treatment plan: 1.Neoadjuvant chemoradiation for vulvar cancer with Taxol, carboplatin, Herceptin weekly x6 cycles 2.neoadjuvant chemotherapy with TCH Perjeta x3 cycles 3.breast conserving surgery if possible with sentinel lymph node study 3. Followed by adjuvant radiation therapy if patient had lumpectomy -------------------------------------------------------------------------------------------------------------------07/27/20: Left mastectomy [name]): three foci of IDC, 3.7cm, 1.5cm, and 1.2cm, with intermediate to high grade DCIS, clear margins, with 1/2 left axillary lymph nodes positive for carcinoma.   Pathology counseling: I discussed the final pathology report of the patient provided  a copy of this report. I discussed the margins as well as lymph node surgeries. We also discussed the final staging along with previously performed ER/PR and HER-2/neu testing.  Plan: 1. Kadcyla maintenance 2. Adj XRT 3. Anti estrogen therapy 4. Neratinib

## 2020-08-03 NOTE — Progress Notes (Signed)
Patient Care Team: Rutherford Guys, MD (Inactive) as PCP - General (Family Medicine) Mauro Kaufmann, RN as Oncology Nurse Navigator Rockwell Germany, RN as Oncology Nurse Navigator Nicholas Lose, MD as Consulting Physician (Hematology and Oncology) Alphonsa Overall, MD as Consulting Physician (General Surgery) Awanda Mink Craige Cotta, RN as Oncology Nurse Navigator (Oncology)  DIAGNOSIS:    ICD-10-CM   1. Malignant neoplasm of upper-inner quadrant of left breast in female, estrogen receptor positive ([place])  C50.212    Z17.0     SUMMARY OF ONCOLOGIC HISTORY: Oncology History  Vulvar cancer ([place])  01/29/2020 Initial Diagnosis   Vulvar cancer ([place])   03/11/2020 - 05/04/2020 Chemotherapy   The patient had dexamethasone (DECADRON) 4 MG tablet, 4 mg (100 % of original dose 4 mg), Oral, Daily, 1 of 1 cycle, Start date: 02/27/2020, End date: 05/05/2020 Dose modification: 4 mg (original dose 4 mg, Cycle 0) palonosetron (ALOXI) injection 0.25 mg, 0.25 mg, Intravenous,  Once, 1 of 1 cycle Administration: 0.25 mg (03/11/2020), 0.25 mg (03/17/2020), 0.25 mg (03/24/2020), 0.25 mg (03/31/2020), 0.25 mg (04/07/2020), 0.25 mg (04/14/2020), 0.25 mg (04/21/2020) CARBOplatin (PARAPLATIN) 300 mg in sodium chloride 0.9 % 250 mL chemo infusion, 300 mg (100 % of original dose 300 mg), Intravenous,  Once, 1 of 1 cycle Dose modification:   (original dose 300 mg, Cycle 1) Administration: 300 mg (03/11/2020), 290 mg (03/17/2020), 300 mg (03/24/2020), 230 mg (03/31/2020), 230 mg (04/07/2020), 230 mg (04/14/2020) PACLitaxel (TAXOL) 108 mg in sodium chloride 0.9 % 250 mL chemo infusion (</= 59m/m2), 50 mg/m2 = 108 mg, Intravenous,  Once, 1 of 1 cycle Dose modification: 45 mg/m2 (original dose 50 mg/m2, Cycle 1, Reason: Dose not tolerated) Administration: 108 mg (03/11/2020), 108 mg (03/17/2020), 108 mg (03/24/2020), 96 mg (03/31/2020), 96 mg (04/07/2020), 96 mg (04/14/2020) fosaprepitant (EMEND) 150 mg in sodium chloride 0.9 % 145 mL IVPB, 150 mg,  Intravenous,  Once, 1 of 1 cycle Administration: 150 mg (03/24/2020), 150 mg (03/31/2020), 150 mg (04/07/2020), 150 mg (04/14/2020) trastuzumab-anns (KANJINTI) 378 mg in sodium chloride 0.9 % 250 mL chemo infusion, 4 mg/kg = 378 mg (100 % of original dose 4 mg/kg), Intravenous,  Once, 1 of 1 cycle Dose modification: 4 mg/kg (original dose 4 mg/kg, Cycle 1, Reason: Other (see comments), Comment: loading dose) Administration: 378 mg (03/11/2020), 189 mg (03/17/2020), 189 mg (03/24/2020), 189 mg (03/31/2020), 189 mg (04/07/2020), 189 mg (04/14/2020), 189 mg (04/21/2020), 189 mg (05/04/2020)  for chemotherapy treatment.    Malignant neoplasm of upper-inner quadrant of left breast in female, estrogen receptor positive ([place]  02/20/2020 Initial Diagnosis   PET scan on 02/07/20 following a diagnosis of vulvar cancer showed a 1.7cm left breast mass and mildly hypermetabolic left axillary lymph nodes. [place]ammogram and [place] 02[place] showed a 3.1cm left breast mass at the 11 o'clock position, palpable on exam, and a single abnormal left axillary lymph node with cortical thickening. Left breast biopsy on 02/20/20 showed invasive and in situ carcinoma in the breast and axilla, grade 3, HER-2 positive (3+), ER/PR+ 95%, Ki67 75%   02/25/2020 Cancer Staging   Staging form: Breast, AJCC 8th Edition - Clinical stage from 02/25/2020: Stage IB (cT2, cN1, cM0, G3, ER+, PR+, HER2+) - Signed by GNicholas Lose MD on 02/25/2020   05/29/2020 - 05/29/2020 Chemotherapy   The patient had palonosetron (ALOXI) injection 0.25 mg, 0.25 mg, Intravenous,  Once, 1 of 3 cycles Administration: 0.25 mg (05/29/2020) pegfilgrastim-jmdb (FULPHILA) injection 6 mg, 6 mg, Subcutaneous,  Once, 1 of  3 cycles CARBOplatin (PARAPLATIN) 580 mg in sodium chloride 0.9 % 250 mL chemo infusion, 580 mg (100 % of original dose 580 mg), Intravenous,  Once, 1 of 3 cycles Dose modification:   (original dose 580 mg, Cycle 1) Administration: 580 mg (05/29/2020) DOCEtaxel (TAXOTERE)  120 mg in sodium chloride 0.9 % 250 mL chemo infusion, 60 mg/m2 = 120 mg (100 % of original dose 60 mg/m2), Intravenous,  Once, 1 of 3 cycles Dose modification: 60 mg/m2 (original dose 60 mg/m2, Cycle 1, Reason: Provider Judgment), 50 mg/m2 (original dose 60 mg/m2, Cycle 2, Reason: Dose not tolerated) Administration: 120 mg (05/29/2020) fosaprepitant (EMEND) 150 mg in sodium chloride 0.9 % 145 mL IVPB, 150 mg, Intravenous,  Once, 1 of 3 cycles Administration: 150 mg (05/29/2020) pertuzumab (PERJETA) 420 mg in sodium chloride 0.9 % 250 mL chemo infusion, 420 mg (50 % of original dose 840 mg), Intravenous, Once, 1 of 3 cycles Dose modification: 420 mg (original dose 840 mg, Cycle 1, Reason: Provider Judgment) Administration: 420 mg (05/29/2020) trastuzumab-dkst (OGIVRI) 525 mg in sodium chloride 0.9 % 250 mL chemo infusion, 6 mg/kg = 525 mg (75 % of original dose 8 mg/kg), Intravenous,  Once, 1 of 3 cycles Dose modification: 6 mg/kg (original dose 8 mg/kg, Cycle 1, Reason: Provider Judgment) Administration: 525 mg (05/29/2020)  for chemotherapy treatment.    06/22/2020 Genetic Testing   Positive genetic testing: Heterozygous pathogenic variant detected in BRIP1 gene at c.2010dup (p.Glu671*).  Variant of uncertain significance detected in POLD1 at c.532G>A (p.Gly178Arg).  No other pathogenic variants detected in [facility] Common Hereditary Cancers Panel.  The report date is June 22, 2020.   The Common Hereditary Cancers Panel offered by [facility] includes sequencing and/or deletion duplication testing of the following 48 genes: APC, ATM, AXIN2, BARD1, BMPR1A, BRCA1, BRCA2, BRIP1, CDH1, CDK4, CDKN2A (p14ARF), CDKN2A (p16INK4a), CHEK2, CTNNA1, DICER1, EPCAM (Deletion/duplication testing only), GREM1 (promoter region deletion/duplication testing only), KIT, MEN1, MLH1, MSH2, MSH3, MSH6, MUTYH, NBN, NF1, NHTL1, PALB2, PDGFRA, PMS2, POLD1, POLE, PTEN, RAD50, RAD51C, RAD51D, RNF43, SDHB, SDHC, SDHD, SMAD4,  SMARCA4. STK11, TP53, TSC1, TSC2, and VHL.  The following genes were evaluated for sequence changes only: SDHA and HOXB13 c.251G>A variant only.   07/27/2020 Surgery   Left mastectomy Sandra Miller): three foci of IDC, 3.7cm, 1.5cm, and 1.2cm, with intermediate to high grade DCIS, clear margins, with 1/2 left axillary lymph nodes positive for carcinoma.      CHIEF COMPLIANT: Follow-up s/p left mastectomy   INTERVAL HISTORY: Sandra Miller is a 44 y.o. with above-mentioned history of left breast cancer and vulvar who completed neoadjuvant chemotherapy. She underwent a left mastectomy on 07/27/20 with Dr. Lucia Miller for which pathology showed three foci of invasive ductal carcinoma, 3.7cm, 1.5cm, and 1.2cm, with intermediate to high grade DCIS, clear margins, with 1/2 left axillary lymph nodes positive for carcinoma. She also has a history of DVT currently on Xarelto. She presents to the clinic today for follow-up.     ALLERGIES:  is allergic to dilaudid [hydromorphone hcl], depakote [divalproex sodium], minocycline, and aspirin.  MEDICATIONS:  Current Outpatient Medications  Medication Sig Dispense Refill  . acetaminophen (TYLENOL) 500 MG tablet Take 2 tablets (1,000 mg total) by mouth every 6 (six) hours as needed for moderate pain or fever. Avoid tylenol until your liver function back to normal 30 tablet 0  . diphenoxylate-atropine (LOMOTIL) 2.5-0.025 MG tablet TAKE 1 TO 2 TABLETS BY MOUTH FOUR TIMES DAILY AS NEEDED FOR DIARRHEA (Patient taking differently: Take 1-2 tablets  by mouth 4 (four) times daily as needed for diarrhea or loose stools. TAKE 1 TO 2 TABLETS BY MOUTH FOUR TIMES DAILY AS NEEDED FOR DIARRHEA) 45 tablet 1  . dronabinol (MARINOL) 2.5 MG capsule Take 1 capsule (2.5 mg total) by mouth 2 (two) times daily before a meal. (Patient taking differently: Take 2.5 mg by mouth 2 (two) times daily as needed (nausea). ) 60 capsule 0  . gabapentin (NEURONTIN) 300 MG capsule Take 1 capsule (300 mg  total) by mouth daily. (Patient not taking: Reported on 07/17/2020) 90 capsule 3  . lidocaine-prilocaine (EMLA) cream Apply 1 application topically daily as needed (prior to port access).     Marland Kitchen lisinopril (ZESTRIL) 20 MG tablet Take 1 tablet (20 mg total) by mouth daily. 30 tablet 0  . LORazepam (ATIVAN) 1 MG tablet Take 1 tablet (1 mg total) by mouth 3 (three) times daily as needed for anxiety. 90 tablet 2  . magnesium oxide (MAG-OX) 400 (241.3 Mg) MG tablet Take 1 tablet (400 mg total) by mouth daily. 30 tablet 1  . Multiple Vitamin (MULTIVITAMIN WITH MINERALS) TABS tablet Take 1 tablet by mouth daily.    . nicotine (NICODERM CQ - DOSED IN MG/24 HOURS) 21 mg/24hr patch Place 1 patch (21 mg total) onto the skin daily. 28 patch 0  . OLANZapine (ZYPREXA) 10 MG tablet Take 1 tablet (10 mg total) by mouth at bedtime. 90 tablet 0  . omeprazole (PRILOSEC) 40 MG capsule TAKE 1 CAPSULE BY MOUTH TWO TIMES DAILY BEFORE A MEAL 180 capsule 0  . potassium chloride SA (KLOR-CON) 20 MEQ tablet Take 1 tablet (20 mEq total) by mouth daily. 60 tablet 2  . prochlorperazine (COMPAZINE) 10 MG tablet TAKE 1 TABLET BY MOUTH EVERY 6 HOURS AS NEEDED FOR NAUSEA OR VOMITING (Patient taking differently: Take 10 mg by mouth every 6 (six) hours as needed for nausea or vomiting. ) 30 tablet 1  . promethazine (PHENERGAN) 25 MG tablet Take 1 tablet (25 mg total) by mouth every 6 (six) hours as needed for nausea. (Patient not taking: Reported on 07/17/2020) 30 tablet 3  . rivaroxaban (XARELTO) 20 MG TABS tablet Take 1 tablet (20 mg total) by mouth daily with supper. 30 tablet 0  . traMADol (ULTRAM) 50 MG tablet Take 1 tablet (50 mg total) by mouth every 6 (six) hours as needed for moderate pain or severe pain (mild pain). 15 tablet 0  . venlafaxine XR (EFFEXOR XR) 37.5 MG 24 hr capsule Take three capsule (112.5 mg total) by mouth daily. 90 capsule 2   No current facility-administered medications for this visit.    Facility-Administered Medications Ordered in Other Visits  Medication Dose Route Frequency Provider Last Rate Last Admin  . LORazepam (ATIVAN) injection 0.5 mg  0.5 mg Intravenous Once Harle Stanford., PA-C        PHYSICAL EXAMINATION: ECOG PERFORMANCE STATUS: 1 - Symptomatic but completely ambulatory  Vitals:   08/04/20 1527  BP: 128/81  Pulse: (!) 116  Resp: 18  Temp: 99 F (37.2 C)  SpO2: 100%   Filed Weights   08/04/20 1527  Weight: 187 lb 8 oz (85 kg)    LABORATORY DATA:  I have reviewed the data as listed CMP Latest Ref Rng & Units 07/21/2020 06/26/2020 06/19/2020  Glucose 70 - 99 mg/dL 108(H) 98 105(H)  BUN 6 - 20 mg/dL _0 Creatinine 0.44 - 1.00 mg/dL 0.74 0.71 0.55  Sodium 135 - 145 mmol/L 135 137  140  Potassium 3.5 - 5.1 mmol/L 4.0 3.9 4.3  Chloride 98 - 111 mmol/L 104 107 110  CO2 22 - 32 mmol/L 20(L) 25 22  Calcium 8.9 - 10.3 mg/dL 10.0 9.5 9.0  Total Protein 6.5 - 8.1 g/dL 7.1 7.1 6.8  Total Bilirubin 0.3 - 1.2 mg/dL 0.3 0.3 0.4  Alkaline Phos 38 - 126 U/L 40 58 36(L)  AST 15 - 41 U/L 26 33 546(H)  ALT 0 - 44 U/L 24 73(H) 181(H)    Lab Results  Component Value Date   WBC 6.4 07/21/2020   HGB 13.3 07/21/2020   HCT 42.4 07/21/2020   MCV 101.7 (H) 07/21/2020   PLT 381 07/21/2020   NEUTROABS 4.9 06/26/2020    ASSESSMENT & PLAN:  Malignant neoplasm of upper-inner quadrant of left breast in female, estrogen receptor positive ([place]) Breast cancer and vulvar cancers  02/20/2020:PET scan on 02/07/20 following a diagnosis of vulvar cancer showed a 1.7cm left breast mass and mildly hypermetabolic left axillary lymph nodes. Mammogram and [place] on 02/18/20 showed a 3.1cm left breast mass at the 11 o'clock position, palpable on exam, and a single abnormal left axillary lymph node with cortical thickening. Left breast biopsy on 02/20/20 showed invasive and in situ carcinoma in the breast and axilla, grade 3, HER-2 positive (3+), ER/PR+ 95%, Ki67 75%  Treatment  plan: 1.Neoadjuvant chemoradiation for vulvar cancer with Taxol, carboplatin, Herceptin weekly x6 cycles 2.neoadjuvant chemotherapy with TCH Perjeta x3 cycles 3.breast conserving surgery if possible with sentinel lymph node study 3. Followed by adjuvant radiation therapy if patient had lumpectomy -------------------------------------------------------------------------------------------------------------------07/27/20: Left mastectomy Sandra Miller): three foci of IDC, 3.7cm, 1.5cm, and 1.2cm, with intermediate to high grade DCIS, clear margins, with 1/2 left axillary lymph nodes positive for carcinoma.   Pathology counseling: I discussed the final pathology report of the patient provided  a copy of this report. I discussed the margins as well as lymph node surgeries. We also discussed the final staging along with previously performed ER/PR and HER-2/neu testing.  Plan: 1. Kadcyla maintenance 2. Adj XRT 3. Anti estrogen therapy 4. Neratinib    No orders of the defined types were placed in this encounter.  The patient has a good understanding of the overall plan. she agrees with it. she will call with any problems that may develop before the next visit here.  Total time spent: 30 mins including face to face time and time spent for planning, charting and coordination of care  Nicholas Lose, MD 08/04/2020  I, Cloyde Reams Dorshimer, am acting as scribe for Dr. Nicholas Lose.  I have reviewed the above documentation for accuracy and completeness, and I agree with the above.

## 2020-08-04 ENCOUNTER — Inpatient Hospital Stay (HOSPITAL_BASED_OUTPATIENT_CLINIC_OR_DEPARTMENT_OTHER): Payer: Self-pay | Admitting: Hematology and Oncology

## 2020-08-04 ENCOUNTER — Encounter: Payer: Self-pay | Admitting: *Deleted

## 2020-08-04 ENCOUNTER — Other Ambulatory Visit: Payer: Self-pay

## 2020-08-04 VITALS — BP 128/81 | HR 116 | Temp 99.0°F | Resp 18 | Ht 65.5 in | Wt 187.5 lb

## 2020-08-04 DIAGNOSIS — C50212 Malignant neoplasm of upper-inner quadrant of left female breast: Secondary | ICD-10-CM

## 2020-08-04 DIAGNOSIS — Z170 Estrogen receptor positive status [ER+]: Secondary | ICD-10-CM

## 2020-08-04 MED ORDER — LIDOCAINE-PRILOCAINE 2.5-2.5 % EX CREA: TOPICAL_CREAM | CUTANEOUS | 3 refills | Status: DC

## 2020-08-04 MED ORDER — INFLUENZA VAC SPLIT QUAD 0.5 ML IM SUSY
PREFILLED_SYRINGE | INTRAMUSCULAR | Status: AC
  Filled 2020-08-04: qty 0.5

## 2020-08-04 MED ORDER — INFLUENZA VAC SPLIT QUAD 0.5 ML IM SUSY
0.5000 mL | PREFILLED_SYRINGE | Freq: Once | INTRAMUSCULAR | Status: AC
  Administered 2020-08-04: 0.5 mL via INTRAMUSCULAR

## 2020-08-04 NOTE — Progress Notes (Signed)
DISCONTINUE OFF PATHWAY REGIMEN - Other   [id number]:Docetaxel  + Carboplatin + Trastuzumab + Pertuzumab (TCHP) q21 Days:   A cycle is every 21 days:     Pertuzumab      Pertuzumab      Trastuzumab-xxxx      Trastuzumab-xxxx      Carboplatin      Docetaxel   **Always confirm dose/schedule in your pharmacy ordering system**  REASON: Other Reason PRIOR TREATMENT: Docetaxel  + Carboplatin + Trastuzumab + Pertuzumab (TCHP) q21 Days TREATMENT RESPONSE: Partial Response (PR)  START OFF PATHWAY REGIMEN - Other   [id number]:Ado-Trastuzumab Emtansine 3.6 mg/kg IV D1 q21 Days:   A cycle is every 21 days:     Ado-trastuzumab emtansine   **Always confirm dose/schedule in your pharmacy ordering system**  Patient Characteristics: Intent of Therapy: Curative Intent, Discussed with Patient

## 2020-08-07 ENCOUNTER — Encounter: Payer: Self-pay | Admitting: *Deleted

## 2020-08-07 ENCOUNTER — Telehealth: Payer: Self-pay | Admitting: *Deleted

## 2020-08-07 NOTE — Telephone Encounter (Signed)
Called and scheduled the patient for a new patient appt on 12/17 at 9 am

## 2020-08-10 ENCOUNTER — Ambulatory Visit (HOSPITAL_COMMUNITY)
Admission: RE | Admit: 2020-08-10 | Discharge: 2020-08-10 | Disposition: A | Discharge status: Home / Self Care | Payer: Self-pay | Source: Ambulatory Visit | Attending: Hematology and Oncology | Admitting: Hematology and Oncology

## 2020-08-10 ENCOUNTER — Telehealth: Payer: Self-pay | Admitting: Hematology and Oncology

## 2020-08-10 ENCOUNTER — Other Ambulatory Visit: Payer: Self-pay

## 2020-08-10 DIAGNOSIS — F17200 Nicotine dependence, unspecified, uncomplicated: Secondary | ICD-10-CM | POA: Insufficient documentation

## 2020-08-10 DIAGNOSIS — Z0189 Encounter for other specified special examinations: Secondary | ICD-10-CM

## 2020-08-10 DIAGNOSIS — C50212 Malignant neoplasm of upper-inner quadrant of left female breast: Secondary | ICD-10-CM | POA: Insufficient documentation

## 2020-08-10 DIAGNOSIS — Z170 Estrogen receptor positive status [ER+]: Secondary | ICD-10-CM | POA: Insufficient documentation

## 2020-08-10 DIAGNOSIS — I10 Essential (primary) hypertension: Secondary | ICD-10-CM | POA: Insufficient documentation

## 2020-08-10 LAB — ECHOCARDIOGRAM COMPLETE
Area-P 1/2: 3.12 cm2
S' Lateral: 2.7 cm

## 2020-08-10 NOTE — Telephone Encounter (Signed)
Scheduled per 11/30 los. Called and spoke with pt, pt confirmed added appts via mychart

## 2020-08-10 NOTE — Progress Notes (Signed)
  Echocardiogram 2D Echocardiogram has been performed.  Sandra Miller 08/10/2020, 10:07 AM

## 2020-08-11 ENCOUNTER — Ambulatory Visit (INDEPENDENT_AMBULATORY_CARE_PROVIDER_SITE_OTHER): Payer: Self-pay | Admitting: Family Medicine

## 2020-08-11 ENCOUNTER — Other Ambulatory Visit: Payer: Self-pay | Admitting: Family Medicine

## 2020-08-11 ENCOUNTER — Encounter: Payer: Self-pay | Admitting: Family Medicine

## 2020-08-11 VITALS — BP 116/77 | HR 113 | Temp 98.2°F | Ht 65.5 in | Wt 189.0 lb

## 2020-08-11 DIAGNOSIS — Z1329 Encounter for screening for other suspected endocrine disorder: Secondary | ICD-10-CM

## 2020-08-11 DIAGNOSIS — Z1321 Encounter for screening for nutritional disorder: Secondary | ICD-10-CM

## 2020-08-11 DIAGNOSIS — Z13220 Encounter for screening for lipoid disorders: Secondary | ICD-10-CM

## 2020-08-11 DIAGNOSIS — K219 Gastro-esophageal reflux disease without esophagitis: Secondary | ICD-10-CM

## 2020-08-11 DIAGNOSIS — I10 Essential (primary) hypertension: Secondary | ICD-10-CM

## 2020-08-11 DIAGNOSIS — R000 Tachycardia, unspecified: Secondary | ICD-10-CM

## 2020-08-11 DIAGNOSIS — I82C11 Acute embolism and thrombosis of right internal jugular vein: Secondary | ICD-10-CM

## 2020-08-11 DIAGNOSIS — Z131 Encounter for screening for diabetes mellitus: Secondary | ICD-10-CM

## 2020-08-11 DIAGNOSIS — C519 Malignant neoplasm of vulva, unspecified: Secondary | ICD-10-CM

## 2020-08-11 DIAGNOSIS — F411 Generalized anxiety disorder: Secondary | ICD-10-CM

## 2020-08-11 DIAGNOSIS — Z111 Encounter for screening for respiratory tuberculosis: Secondary | ICD-10-CM

## 2020-08-11 DIAGNOSIS — C50912 Malignant neoplasm of unspecified site of left female breast: Secondary | ICD-10-CM

## 2020-08-11 MED ORDER — LISINOPRIL 20 MG PO TABS: 20.0000 mg | ORAL_TABLET | Freq: Every day | ORAL | 3 refills | Status: DC

## 2020-08-11 MED FILL — LISINOPRIL 20 MG TABS: 20 | 90 days supply | Qty: 90 | Fill #0

## 2020-08-11 NOTE — Progress Notes (Signed)
12/7/202110:29 AM  Sandra Miller [birth year], 44 y.o., female [id number]  Chief Complaint  Patient presents with  . Transitions Of Care  . Hypertension    medication refill   . tb test for work - quantiferon    HPI:   Patient is a 44 y.o. female with past medical history significant  for HTN, HLP, bipolar disorder, breast and vulvar cancer and GAD who presents today for transfer of care.  Has been seeing Dr Carlean Purl for years for functional dyspepsia, GERD, EGD - esophageal stenosis, ulcers; colonoscopy given fhx colon cancer  Elevated LFT's closely monitor medications  Had covid vaccines in jan 2021  Oncology (Vulvar and breast ca) Vulvar cancer (1B): sent to [facility] PET scan: found breast CA Did chemo and radiation 1st Ended up in the hospital due to elevated LFT 2 weeks ago Left side mastectomy and 1 lymph node JP Drain still present [place] long doing breast CA (1B) Will see [facility] 12/17 (surgery if not gone) 2 different cancers  Genetics testing done BRCA 1 gene Daughter is getting tested Kids 78 and 47 Ovaries and tubes removed eventually?  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) COVID: done 14th booster  Jugular clot: Xarelto monitored by oncology Considered switching to coumadin and lovenox due to cost  Patient Care Team: Mariza Bourget, Laurita Quint, FNP as PCP - General (Family Medicine) Mauro Kaufmann, RN as Oncology Nurse Navigator Rockwell Germany, RN as Oncology Nurse Navigator Nicholas Lose, MD as Consulting Physician (Hematology and Oncology) Alphonsa Overall, MD as Consulting Physician (General Surgery) Awanda Mink Craige Cotta, RN as Oncology Nurse Navigator (Oncology)  HTN Lisinopril 33m ECHO done yesterday Never had tachycardia before Now HR in the 110-120 BP Readings from Last 3 Encounters:  08/11/20 116/77   08/04/20 128/81  07/28/20 122/85     Always anxious Takes ativan tid Stable on this reginmen  Dep[facility] 08/11/2020 01/23/2020 12/11/2019  Decreased Interest 0 1 0  Down, Depressed, Hopeless 0 0 0  PHQ - 2 Score 0 1 0  Altered sleeping - 0 0  Tired, decreased energy - 2 1  Change in appetite - 0 0  Feeling bad or failure about yourself  - 0 0  Trouble concentrating - 2 1  Moving slowly or fidgety/restless - 0 0  Suicidal thoughts - 0 0  PHQ-9 Score - 5 2  Some recent data might be hidden    Fall Risk  08/11/2020 10/31/2019 12/27/2018 10/09/2018 07/09/2018  Falls in the past year? 0 0 0 0 0  Number falls in past yr: 0 0 0 0 -  Injury with Fall? 0 0 0 0 -  Follow up - Falls evaluation completed - - -     Allergies  Allergen Reactions  . Dilaudid [Hydromorphone Hcl] Other (See Comments)    Pt became confused, pulled out iv's, does not remember anything  . Depakote [Divalproex Sodium] Nausea And Vomiting  . Minocycline Hives  . Aspirin Hives    States able to tolerate Goody Powders without any problem except for GI upset    Prior to Admission medications   Medication Sig Start Date End Date Taking? Authorizing Provider  acetaminophen (TYLENOL) 500 MG tablet Take 2 tablets (1,000 mg total) by mouth every 6 (six) hours as needed for moderate  pain or fever. Avoid tylenol until your liver function back to normal 06/19/20  Yes Florencia Reasons, MD  diphenoxylate-atropine (LOMOTIL) 2.5-0.025 MG tablet TAKE 1 TO 2 TABLETS BY MOUTH FOUR TIMES DAILY AS NEEDED FOR DIARRHEA Patient taking differently: Take 1-2 tablets by mouth 4 (four) times daily as needed for diarrhea or loose stools. TAKE 1 TO 2 TABLETS BY MOUTH FOUR TIMES DAILY AS NEEDED FOR DIARRHEA 05/26/20  Yes Nicholas Lose, MD  dronabinol (MARINOL) 2.5 MG capsule Take 1 capsule (2.5 mg total) by mouth 2 (two) times daily before a meal. Patient taking differently: Take 2.5 mg by mouth 2 (two) times daily as needed (nausea).   06/04/20  Yes Tanner, Lyndon Code., PA-C  lidocaine-prilocaine (EMLA) cream Apply 1 application topically daily as needed (prior to port access).    Yes [provider]  lidocaine-prilocaine (EMLA) cream Apply to affected area once 08/04/20  Yes Nicholas Lose, MD  lisinopril (ZESTRIL) 20 MG tablet Take 1 tablet (20 mg total) by mouth daily. 07/15/20  Yes Nicholas Lose, MD  LORazepam (ATIVAN) 1 MG tablet Take 1 tablet (1 mg total) by mouth 3 (three) times daily as needed for anxiety. 06/26/20  Yes Arfeen, Arlyce Harman, MD  magnesium oxide (MAG-OX) 400 (241.3 Mg) MG tablet Take 1 tablet (400 mg total) by mouth daily. 06/26/20  Yes Nicholas Lose, MD  Multiple Vitamin (MULTIVITAMIN WITH MINERALS) TABS tablet Take 1 tablet by mouth daily.   Yes [provider]  nicotine (NICODERM CQ - DOSED IN MG/24 HOURS) 21 mg/24hr patch Place 1 patch (21 mg total) onto the skin daily. 06/19/20  Yes Florencia Reasons, MD  OLANZapine (ZYPREXA) 10 MG tablet Take 1 tablet (10 mg total) by mouth at bedtime. 06/26/20 06/26/21 Yes Arfeen, Arlyce Harman, MD  omeprazole (PRILOSEC) 40 MG capsule TAKE 1 CAPSULE BY MOUTH TWO TIMES DAILY BEFORE A MEAL 08/03/20  Yes Gatha Mayer, MD  potassium chloride SA (KLOR-CON) 20 MEQ tablet Take 1 tablet (20 mEq total) by mouth daily. 06/19/20  Yes Florencia Reasons, MD  prochlorperazine (COMPAZINE) 10 MG tablet TAKE 1 TABLET BY MOUTH EVERY 6 HOURS AS NEEDED FOR NAUSEA OR VOMITING Patient taking differently: Take 10 mg by mouth every 6 (six) hours as needed for nausea or vomiting.  06/16/20  Yes Nicholas Lose, MD  promethazine (PHENERGAN) 25 MG tablet Take 1 tablet (25 mg total) by mouth every 6 (six) hours as needed for nausea. 04/28/20  Yes Nicholas Lose, MD  rivaroxaban (XARELTO) 20 MG TABS tablet Take 1 tablet (20 mg total) by mouth daily with supper. 06/19/20  Yes Florencia Reasons, MD  traMADol (ULTRAM) 50 MG tablet Take 1 tablet (50 mg total) by mouth every 6 (six) hours as needed for moderate pain or severe pain  (mild pain). 07/28/20  Yes Alphonsa Overall, MD  venlafaxine XR (EFFEXOR XR) 37.5 MG 24 hr capsule Take three capsule (112.5 mg total) by mouth daily. 06/26/20  Yes Arfeen, Arlyce Harman, MD    Past Medical History:  Diagnosis Date  . Bipolar 1 disorder ([place])   . Cancer ([place])   . Complication of anesthesia    wakes up during procedures  . Depression   . GAD (generalized anxiety disorder)   . Genital HSV    currently per pt  no break out 03-22-2016   . GERD (gastroesophageal reflux disease)   . Hiatal hernia   . History of cervical dysplasia    2012 laser ablation  . History of esophageal dilatation  for dysphasia -- x2 dilated  . History of gastric ulcer   . History of Helicobacter pylori infection    remote hx  . History of hidradenitis suppurativa    "gets all over body intermittantly"    . History of hypertension    no issue since stopped drinking alcohol 2014  . History of kidney stones   . History of panic attacks   . Hypertension   . Iron deficiency anemia   . Left ureteral stone   . OCD (obsessive compulsive disorder)   . PONV (postoperative nausea and vomiting)   . PTSD (post-traumatic stress disorder)   . Recovering alcoholic in remission ([place])    since 2014  . RLS (restless legs syndrome)   . Smokers' cough ([place])   . Urgency of urination   . Yeast infection involving the vagina and surrounding area    secondary to taking antibiotic    Past Surgical History:  Procedure Laterality Date  . CESAREAN SECTION  1995   w/  Bilateral Tubal Ligation  . COLONOSCOPY  last one 08-09-2013  . CYSTOSCOPY W/ URETERAL STENT PLACEMENT Left 03/29/2016   Procedure: CYSTOSCOPY WITH STENT REPLACEMENT;  Surgeon: Nickie Retort, MD;  [facility];  Service: Urology;  Laterality: Left;  . CYSTOSCOPY WITH RETROGRADE PYELOGRAM, URETEROSCOPY AND STENT PLACEMENT Left 03/08/2016   Procedure: CYSTOSCOPY WITH  LEFT RETROGRADE PYELOGRAM, AND STENT PLACEMENT;  Surgeon:  Nickie Retort, MD;  Location: [facility];  Service: Urology;  Laterality: Left;  . CYSTOSCOPY/RETROGRADE/URETEROSCOPY/STONE EXTRACTION WITH BASKET Left 03/29/2016   Procedure: CYSTOSCOPY/RETROGRADE/URETEROSCOPY/STONE EXTRACTION WITH BASKET;  Surgeon: Nickie Retort, MD;  Location: [facility];  Service: Urology;  Laterality: Left;  . ENDOMETRIAL ABLATION W/ NOVASURE  04-01-2010  . ESOPHAGOGASTRODUODENOSCOPY  last one 08-09-2013  . KNEE ARTHROSCOPY Left as teen  . LASER ABLATION OF THE CERVIX  2012 approx  . MASTECTOMY WITH AXILLARY LYMPH NODE DISSECTION Left 07/27/2020   Procedure: LEFT MASTECTOMY WITH LEFT RADIOACTIVE SEED GUIDED TARGETED AXILLARY LYMPH NODE DISSECTION;  Surgeon: Alphonsa Overall, MD;  Location: [place];  Service: General;  Laterality: Left;  . PORTACATH PLACEMENT Right 03/06/2020   Procedure: INSERTION PORT-A-CATH WITH ULTRASOUND GUIDANCE;  Surgeon: Alphonsa Overall, MD;  Location: [place];  Service: General;  Laterality: Right;  . TRANSTHORACIC ECHOCARDIOGRAM  05-19-2006   lvsf normal, ef 55-65%, there was mild flattening of the interventricular septum during diastoli/  RV size at upper limits normal  . TUBAL LIGATION    . WISDOM TOOTH EXTRACTION  age 57 's    Social History   Tobacco Use  . Smoking status: Current Every Day Smoker    Packs/day: 0.50    Years: 22.00    Pack years: 11.00    Types: Cigarettes  . Smokeless tobacco: Never Used  . Tobacco comment: Recently started a smoking cessation class.   Substance Use Topics  . Alcohol use: Not Currently    Alcohol/week: 0.0 standard drinks    Comment:  hx alcohollism  in remission since 2014    Family History  Problem Relation Age of Onset  . Heart disease Father   . Lung cancer Father        d. 34  . Alcohol abuse Father   . Heart disease Mother   . Depression Mother   . Anxiety disorder Mother   . Drug abuse Brother   . Alcohol abuse Brother   . Drug abuse Brother   . ADD  /  ADHD Brother   . Colon polyps Brother   . Cancer Paternal Grandfather        "stomach"  . Diabetes Maternal Grandfather   . Diabetes Paternal Grandmother   . Kidney disease Maternal Uncle   . Cirrhosis Cousin        alcoholic  . Anxiety disorder Maternal Aunt   . Depression Maternal Aunt   . Cancer Cousin        maternal; ovarian cancer or other "female" cancer?  . Lung cancer Paternal Uncle 71  . Throat cancer Cousin        paternal; dx 30s  . Lung cancer Cousin        paternal; dx 32s    Review of Systems  Constitutional: Positive for malaise/fatigue. Negative for chills and fever.  Eyes: Negative for blurred vision and double vision.  Respiratory: Negative for cough, shortness of breath and wheezing.   Cardiovascular: Negative for chest pain, palpitations and leg swelling.  Gastrointestinal: Negative for abdominal pain, blood in stool, constipation, diarrhea, heartburn, nausea and vomiting.  Genitourinary: Negative for dysuria, frequency and hematuria.  Musculoskeletal: Negative for back pain and joint pain.  Skin: Negative for rash.  Neurological: Positive for weakness. Negative for dizziness and headaches.  Endo/Heme/Allergies: Bruises/bleeds easily.  Psychiatric/Behavioral: Negative for depression and suicidal ideas. The patient is nervous/anxious.      OBJECTIVE:  Today's Vitals   08/11/20 0920  BP: 116/77  Pulse: (!) 113  Temp: 98.2 F (36.8 C)  SpO2: 98%  Weight: 189 lb (85.7 kg)  Height: 5' 5.5" (1.664 m)   Body mass index is 30.97 kg/m.   Physical Exam Constitutional:      General: She is not in acute distress.    Appearance: Normal appearance. She is not ill-appearing.  HENT:     Head: Normocephalic.  Cardiovascular:     Rate and Rhythm: Normal rate and regular rhythm.     Pulses: Normal pulses.     Heart sounds: Normal heart sounds. No murmur heard.  No friction rub. No gallop.   Pulmonary:     Effort: Pulmonary effort is normal. No  respiratory distress.     Breath sounds: Normal breath sounds. No stridor. No wheezing, rhonchi or rales.  Abdominal:     General: Bowel sounds are normal.     Palpations: Abdomen is soft.     Tenderness: There is no abdominal tenderness.  Musculoskeletal:     Right lower leg: No edema.     Left lower leg: No edema.  Skin:    General: Skin is warm and dry.     Comments: JP drain in place Left breast, Incisions from recent surgery  Neurological:     Mental Status: She is alert and oriented to person, place, and time.  Psychiatric:        Mood and Affect: Mood normal.        Behavior: Behavior normal.     No results found for this or any previous visit (from the past 24 hour(s)).  No results found.   ASSESSMENT and PLAN  Problem List Items Addressed This Visit      Cardiovascular and Mediastinum   Essential hypertension - Primary   Relevant Medications   lisinopril (ZESTRIL) 20 MG tablet Refills sent. BP Readings from Last 3 Encounters:  08/11/20 116/77  08/04/20 128/81  07/28/20 122/85      Acute embolism and thrombosis of right internal jugular vein (HCC)   Relevant Medications   Daily  Xarelto     Digestive   GERD (gastroesophageal reflux disease) Managed by GI     Genitourinary   Vulvar cancer ([place]) Managed by [facility] On Radiation     Other   Breast cancer, stage 2, left ([place]) Managed by [facility]    Other Visit Diagnoses    Screening for tuberculosis: for work   Relevant Orders   QuantiFERON-TB Gold Plus   Screening for thyroid disorder       Relevant Orders   TSH   Screening for diabetes mellitus       Relevant Orders   Hemoglobin A1c   Encounter for vitamin deficiency screening       Relevant Orders   Magnesium: on Replacement   Vitamin D, 25-hydroxy   Lipid screening       Relevant Orders   Lipid Panel   Generalized anxiety disorder     Stable on current regimen of Ativan tid   Tachycardia     Stable, this  is new since starting chemo Echo done yesterday      Return in about 3 months (around 11/09/2020).   Huston Foley Hy Swiatek, FNP-BC Primary Care at [facility], [place] [address] Ph.  [phone] Fax [phone]

## 2020-08-11 NOTE — Patient Instructions (Addendum)
Hypertension, Adult Hypertension is another name for high blood pressure. High blood pressure forces your heart to work harder to pump blood. This can cause problems over time. There are two numbers in a blood pressure reading. There is a top number (systolic) over a bottom number (diastolic). It is best to have a blood pressure that is below 120/80. Healthy choices can help lower your blood pressure, or you may need medicine to help lower it. What are the causes? The cause of this condition is not known. Some conditions may be related to high blood pressure. What increases the risk?  Smoking.  Having type 2 diabetes mellitus, high cholesterol, or both.  Not getting enough exercise or physical activity.  Being overweight.  Having too much fat, sugar, calories, or salt (sodium) in your diet.  Drinking too much alcohol.  Having long-term (chronic) kidney disease.  Having a family history of high blood pressure.  Age. Risk increases with age.  Race. You may be at higher risk if you are African American.  Gender. Men are at higher risk than women before age 45. After age 65, women are at higher risk than men.  Having obstructive sleep apnea.  Stress. What are the signs or symptoms?  High blood pressure may not cause symptoms. Very high blood pressure (hypertensive crisis) may cause: ? Headache. ? Feelings of worry or nervousness (anxiety). ? Shortness of breath. ? Nosebleed. ? A feeling of being sick to your stomach (nausea). ? Throwing up (vomiting). ? Changes in how you see. ? Very bad chest pain. ? Seizures. How is this treated?  This condition is treated by making healthy lifestyle changes, such as: ? Eating healthy foods. ? Exercising more. ? Drinking less alcohol.  Your health care provider may prescribe medicine if lifestyle changes are not enough to get your blood pressure under control, and if: ? Your top number is above 130. ? Your bottom number is above 80.   Your personal target blood pressure may vary. Follow these instructions at home: Eating and drinking   If told, follow the DASH eating plan. To follow this plan: ? Fill one half of your plate at each meal with fruits and vegetables. ? Fill one fourth of your plate at each meal with whole grains. Whole grains include whole-wheat pasta, brown rice, and whole-grain bread. ? Eat or drink low-fat dairy products, such as skim milk or low-fat yogurt. ? Fill one fourth of your plate at each meal with low-fat (lean) proteins. Low-fat proteins include fish, chicken without skin, eggs, beans, and tofu. ? Avoid fatty meat, cured and processed meat, or chicken with skin. ? Avoid pre-made or processed food.  Eat less than 1,500 mg of salt each day.  Do not drink alcohol if: ? Your doctor tells you not to drink. ? You are pregnant, may be pregnant, or are planning to become pregnant.  If you drink alcohol: ? Limit how much you use to:  0-1 drink a day for women.  0-2 drinks a day for men. ? Be aware of how much alcohol is in your drink. In the [place] one drink equals one 12 oz bottle of beer (355 mL), one 5 oz glass of wine (148 mL), or one 1 oz glass of hard liquor (44 mL). Lifestyle   Work with your doctor to stay at a healthy weight or to lose weight. Ask your doctor what the best weight is for you.  Get at least 30 minutes of exercise most   days of the week. This may include walking, swimming, or biking.  Get at least 30 minutes of exercise that strengthens your muscles (resistance exercise) at least 3 days a week. This may include lifting weights or doing Pilates.  Do not use any products that contain nicotine or tobacco, such as cigarettes, e-cigarettes, and chewing tobacco. If you need help quitting, ask your doctor.  Check your blood pressure at home as told by your doctor.  Keep all follow-up visits as told by your doctor. This is important. Medicines  Take over-the-counter and  prescription medicines only as told by your doctor. Follow directions carefully.  Do not skip doses of blood pressure medicine. The medicine does not work as well if you skip doses. Skipping doses also puts you at risk for problems.  Ask your doctor about side effects or reactions to medicines that you should watch for. Contact a doctor if you:  Think you are having a reaction to the medicine you are taking.  Have headaches that keep coming back (recurring).  Feel dizzy.  Have swelling in your ankles.  Have trouble with your vision. Get help right away if you:  Get a very bad headache.  Start to feel mixed up (confused).  Feel weak or numb.  Feel faint.  Have very bad pain in your: ? Chest. ? Belly (abdomen).  Throw up more than once.  Have trouble breathing. Summary  Hypertension is another name for high blood pressure.  High blood pressure forces your heart to work harder to pump blood.  For most people, a normal blood pressure is less than 120/80.  Making healthy choices can help lower blood pressure. If your blood pressure does not get lower with healthy choices, you may need to take medicine. This information is not intended to replace advice given to you by your health care provider. Make sure you discuss any questions you have with your health care provider. Document Revised: 05/02/2018 Document Reviewed: 05/02/2018 Elsevier Patient Education  [employer].     If you have lab work done today you will be contacted with your lab results within the next 2 weeks.  If you have not heard from [place] then please contact us. The fastest way to get your results is to register for My Chart.   IF you received an x-ray today, you will receive an invoice from [facility]. Please contact [facility] at [phone] with questions or concerns regarding your invoice.   IF you received labwork today, you will receive an invoice from [facility]. Please  contact [facility] at [phone] with questions or concerns regarding your invoice.   Our billing staff will not be able to assist you with questions regarding bills from these companies.  You will be contacted with the lab results as soon as they are available. The fastest way to get your results is to activate your My Chart account. Instructions are located on the last page of this paperwork. If you have not heard from [place] regarding the results in 2 weeks, please contact this office.

## 2020-08-12 ENCOUNTER — Other Ambulatory Visit: Payer: Self-pay | Admitting: Family Medicine

## 2020-08-12 DIAGNOSIS — E785 Hyperlipidemia, unspecified: Secondary | ICD-10-CM | POA: Insufficient documentation

## 2020-08-12 DIAGNOSIS — E782 Mixed hyperlipidemia: Secondary | ICD-10-CM

## 2020-08-12 DIAGNOSIS — R7303 Prediabetes: Secondary | ICD-10-CM | POA: Insufficient documentation

## 2020-08-12 MED ORDER — MAGNESIUM OXIDE 400 (241.3 MG) MG PO TABS: 400.0000 mg | ORAL_TABLET | Freq: Two times a day (BID) | ORAL | 1 refills | Status: DC

## 2020-08-12 MED ORDER — ROSUVASTATIN CALCIUM 10 MG PO TABS: 10.0000 mg | ORAL_TABLET | Freq: Every day | ORAL | 3 refills | Status: DC

## 2020-08-12 MED FILL — ROSUVASTATIN CALCIUM 10 MG: 10 | 90 days supply | Qty: 90 | Fill #0

## 2020-08-13 NOTE — Progress Notes (Signed)
If you could let Sandra Miller know to double her magnesium and take it twice a day instead of once a day. And then come in late next week to recheck that lab.

## 2020-08-14 LAB — QUANTIFERON-TB GOLD PLUS
QuantiFERON Mitogen Value: >10 IU/mL
QuantiFERON Nil Value: 0.01 IU/mL
QuantiFERON TB1 Ag Value: 0.01 IU/mL
QuantiFERON TB2 Ag Value: 0.01 IU/mL
QuantiFERON-TB Gold Plus: NEGATIVE

## 2020-08-14 LAB — VITAMIN D 25 HYDROXY (VIT D DEFICIENCY, FRACTURES): Vit D, 25-Hydroxy: 24.4 ng/mL — ABNORMAL LOW (ref 30.0–100.0)

## 2020-08-14 LAB — HEMOGLOBIN A1C
Est. average glucose Bld gHb Est-mCnc: 120 mg/dL
Hgb A1c MFr Bld: 5.8 % — ABNORMAL HIGH (ref 4.8–5.6)

## 2020-08-14 LAB — MAGNESIUM: Magnesium: 0.7 mg/dL — CL (ref 1.6–2.3)

## 2020-08-14 LAB — LIPID PANEL
Chol/HDL Ratio: 4.7 ratio — ABNORMAL HIGH (ref 0.0–4.4)
Cholesterol, Total: 269 mg/dL — ABNORMAL HIGH (ref 100–199)
HDL: 57 mg/dL (ref >39)
LDL Chol Calc (NIH): 183 mg/dL — ABNORMAL HIGH (ref 0–99)
Triglycerides: 156 mg/dL — ABNORMAL HIGH (ref 0–149)
VLDL Cholesterol Cal: 29 mg/dL (ref 5–40)

## 2020-08-14 LAB — TSH: TSH: 0.591 u[IU]/mL (ref 0.450–4.500)

## 2020-08-17 ENCOUNTER — Other Ambulatory Visit: Payer: Self-pay | Admitting: Family Medicine

## 2020-08-17 MED FILL — VENLAFAXINE HCL ER 37.5 MG: 37.5 | 30 days supply | Qty: 90 | Fill #1

## 2020-08-17 MED FILL — XARELTO 20 MG TABLET: 20 | 30 days supply | Qty: 30 | Fill #3

## 2020-08-17 MED FILL — DIPHENOXYLATE-ATROPINE 2.5-: 2.5-0.025 | 6 days supply | Qty: 45 | Fill #1

## 2020-08-17 NOTE — Telephone Encounter (Signed)
Medication:  magnesium oxide (MAG-OX) 400 (241.3 Mg) MG tablet [[id number]]   Has the patient contacted their pharmacy? No  (Agent: If no, request that the patient contact the pharmacy for the refill.)    Preferred Pharmacy (with phone number or street name):  [place], [place] - [address]  [place], [place] [address]  Phone:  [phone] Fax:  [phone]  Agent: Please be advised that RX refills may take up to 3 business days. We ask that you follow-up with your pharmacy.

## 2020-08-17 NOTE — Progress Notes (Signed)
Patient Care Team: Just, Laurita Quint, FNP as PCP - General (Family Medicine) Mauro Kaufmann, RN as Oncology Nurse Navigator Rockwell Germany, RN as Oncology Nurse Navigator Nicholas Lose, MD as Consulting Physician (Hematology and Oncology) Alphonsa Overall, MD as Consulting Physician (General Surgery) Awanda Mink Craige Cotta, RN as Oncology Nurse Navigator (Oncology)  DIAGNOSIS:    ICD-10-CM   1. Malignant neoplasm of upper-inner quadrant of left breast in female, estrogen receptor positive ([place])  C50.212    Z17.0     SUMMARY OF ONCOLOGIC HISTORY: Oncology History  Vulvar cancer ([place])  01/29/2020 Initial Diagnosis   Vulvar cancer ([place])   03/11/2020 - 05/04/2020 Chemotherapy   The patient had dexamethasone (DECADRON) 4 MG tablet, 4 mg (100 % of original dose 4 mg), Oral, Daily, 1 of 1 cycle, Start date: 02/27/2020, End date: 05/05/2020 Dose modification: 4 mg (original dose 4 mg, Cycle 0) palonosetron (ALOXI) injection 0.25 mg, 0.25 mg, Intravenous,  Once, 1 of 1 cycle Administration: 0.25 mg (03/11/2020), 0.25 mg (03/17/2020), 0.25 mg (03/24/2020), 0.25 mg (03/31/2020), 0.25 mg (04/07/2020), 0.25 mg (04/14/2020), 0.25 mg (04/21/2020) CARBOplatin (PARAPLATIN) 300 mg in sodium chloride 0.9 % 250 mL chemo infusion, 300 mg (100 % of original dose 300 mg), Intravenous,  Once, 1 of 1 cycle Dose modification:   (original dose 300 mg, Cycle 1) Administration: 300 mg (03/11/2020), 290 mg (03/17/2020), 300 mg (03/24/2020), 230 mg (03/31/2020), 230 mg (04/07/2020), 230 mg (04/14/2020) PACLitaxel (TAXOL) 108 mg in sodium chloride 0.9 % 250 mL chemo infusion (</= 59m/m2), 50 mg/m2 = 108 mg, Intravenous,  Once, 1 of 1 cycle Dose modification: 45 mg/m2 (original dose 50 mg/m2, Cycle 1, Reason: Dose not tolerated) Administration: 108 mg (03/11/2020), 108 mg (03/17/2020), 108 mg (03/24/2020), 96 mg (03/31/2020), 96 mg (04/07/2020), 96 mg (04/14/2020) fosaprepitant (EMEND) 150 mg in sodium chloride 0.9 % 145 mL IVPB, 150 mg, Intravenous,   Once, 1 of 1 cycle Administration: 150 mg (03/24/2020), 150 mg (03/31/2020), 150 mg (04/07/2020), 150 mg (04/14/2020) trastuzumab-anns (KANJINTI) 378 mg in sodium chloride 0.9 % 250 mL chemo infusion, 4 mg/kg = 378 mg (100 % of original dose 4 mg/kg), Intravenous,  Once, 1 of 1 cycle Dose modification: 4 mg/kg (original dose 4 mg/kg, Cycle 1, Reason: Other (see comments), Comment: loading dose) Administration: 378 mg (03/11/2020), 189 mg (03/17/2020), 189 mg (03/24/2020), 189 mg (03/31/2020), 189 mg (04/07/2020), 189 mg (04/14/2020), 189 mg (04/21/2020), 189 mg (05/04/2020)  for chemotherapy treatment.    Malignant neoplasm of upper-inner quadrant of left breast in female, estrogen receptor positive ([facility]  02/20/2020 Initial Diagnosis   PET scan on 02/07/20 following a diagnosis of vulvar cancer showed a 1.7cm left breast mass and mildly hypermetabolic left axillary lymph nodes. [place]ogram [place] showed a 3.1cm left breast mass at the 11 o'clock position, palpable on exam, and a single abnormal left axillary lymph node with cortical thickening. Left breast biopsy on 02/20/20 showed invasive and in situ carcinoma in the breast and axilla, grade 3, HER-2 positive (3+), ER/PR+ 95%, Ki67 75%   02/25/2020 Cancer Staging   Staging form: Breast, AJCC 8th Edition - Clinical stage from 02/25/2020: Stage IB (cT2, cN1, cM0, G3, ER+, PR+, HER2+) - Signed by GNicholas Lose MD on 02/25/2020   05/29/2020 - 05/29/2020 Chemotherapy   The patient had palonosetron (ALOXI) injection 0.25 mg, 0.25 mg, Intravenous,  Once, 1 of 3 cycles Administration: 0.25 mg (05/29/2020) pegfilgrastim-jmdb (FULPHILA) injection 6 mg, 6 mg, Subcutaneous,  Once, 1 of 3  cycles CARBOplatin (PARAPLATIN) 580 mg in sodium chloride 0.9 % 250 mL chemo infusion, 580 mg (100 % of original dose 580 mg), Intravenous,  Once, 1 of 3 cycles Dose modification:   (original dose 580 mg, Cycle 1) Administration: 580 mg (05/29/2020) DOCEtaxel (TAXOTERE) 120 mg in  sodium chloride 0.9 % 250 mL chemo infusion, 60 mg/m2 = 120 mg (100 % of original dose 60 mg/m2), Intravenous,  Once, 1 of 3 cycles Dose modification: 60 mg/m2 (original dose 60 mg/m2, Cycle 1, Reason: Provider Judgment), 50 mg/m2 (original dose 60 mg/m2, Cycle 2, Reason: Dose not tolerated) Administration: 120 mg (05/29/2020) fosaprepitant (EMEND) 150 mg in sodium chloride 0.9 % 145 mL IVPB, 150 mg, Intravenous,  Once, 1 of 3 cycles Administration: 150 mg (05/29/2020) pertuzumab (PERJETA) 420 mg in sodium chloride 0.9 % 250 mL chemo infusion, 420 mg (50 % of original dose 840 mg), Intravenous, Once, 1 of 3 cycles Dose modification: 420 mg (original dose 840 mg, Cycle 1, Reason: Provider Judgment) Administration: 420 mg (05/29/2020) trastuzumab-dkst (OGIVRI) 525 mg in sodium chloride 0.9 % 250 mL chemo infusion, 6 mg/kg = 525 mg (75 % of original dose 8 mg/kg), Intravenous,  Once, 1 of 3 cycles Dose modification: 6 mg/kg (original dose 8 mg/kg, Cycle 1, Reason: Provider Judgment) Administration: 525 mg (05/29/2020)  for chemotherapy treatment.    06/22/2020 Genetic Testing   Positive genetic testing: Heterozygous pathogenic variant detected in BRIP1 gene at c.2010dup (p.Glu671*).  Variant of uncertain significance detected in POLD1 at c.532G>A (p.Gly178Arg).  No other pathogenic variants detected in [facility] Common Hereditary Cancers Panel.  The report date is June 22, 2020.   The Common Hereditary Cancers Panel offered by [facility] includes sequencing and/or deletion duplication testing of the following 48 genes: APC, ATM, AXIN2, BARD1, BMPR1A, BRCA1, BRCA2, BRIP1, CDH1, CDK4, CDKN2A (p14ARF), CDKN2A (p16INK4a), CHEK2, CTNNA1, DICER1, EPCAM (Deletion/duplication testing only), GREM1 (promoter region deletion/duplication testing only), KIT, MEN1, MLH1, MSH2, MSH3, MSH6, MUTYH, NBN, NF1, NHTL1, PALB2, PDGFRA, PMS2, POLD1, POLE, PTEN, RAD50, RAD51C, RAD51D, RNF43, SDHB, SDHC, SDHD, SMAD4, SMARCA4. STK11,  TP53, TSC1, TSC2, and VHL.  The following genes were evaluated for sequence changes only: SDHA and HOXB13 c.251G>A variant only.   07/27/2020 Surgery   Left mastectomy Sandra Miller): three foci of IDC, 3.7cm, 1.5cm, and 1.2cm, with intermediate to high grade DCIS, clear margins, with 1/2 left axillary lymph nodes positive for carcinoma.    08/18/2020 -  Chemotherapy   The patient had ado-trastuzumab emtansine (KADCYLA) 300 mg in sodium chloride 0.9 % 250 mL chemo infusion, 3.6 mg/kg = 300 mg, Intravenous, Once, 0 of 12 cycles  for chemotherapy treatment.      CHIEF COMPLIANT: Follow-up for Kadcyla maintenance  INTERVAL HISTORY: Sandra Miller is a 44 y.o. with above-mentioned history of left breast cancer and vulvarcancer who completedneoadjuvant chemotherapy, underwent a left mastectomy, and is currently on Kadcyla maintenance. She also has a history of DVT currently on Xarelto.Echo on 08/10/20 showed an ejection fraction of 65-70%. She presents to the clinic todayfor treatment.    ALLERGIES:  is allergic to dilaudid [hydromorphone hcl], depakote [divalproex sodium], minocycline, and aspirin.  MEDICATIONS:  Current Outpatient Medications  Medication Sig Dispense Refill  . acetaminophen (TYLENOL) 500 MG tablet Take 2 tablets (1,000 mg total) by mouth every 6 (six) hours as needed for moderate pain or fever. Avoid tylenol until your liver function back to normal 30 tablet 0  . diphenoxylate-atropine (LOMOTIL) 2.5-0.025 MG tablet TAKE 1 TO 2 TABLETS BY MOUTH  FOUR TIMES DAILY AS NEEDED FOR DIARRHEA (Patient taking differently: Take 1-2 tablets by mouth 4 (four) times daily as needed for diarrhea or loose stools. TAKE 1 TO 2 TABLETS BY MOUTH FOUR TIMES DAILY AS NEEDED FOR DIARRHEA) 45 tablet 1  . dronabinol (MARINOL) 2.5 MG capsule Take 1 capsule (2.5 mg total) by mouth 2 (two) times daily before a meal. (Patient taking differently: Take 2.5 mg by mouth 2 (two) times daily as needed (nausea). ) 60  capsule 0  . lidocaine-prilocaine (EMLA) cream Apply 1 application topically daily as needed (prior to port access).     Marland Kitchen lidocaine-prilocaine (EMLA) cream Apply to affected area once 30 g 3  . lisinopril (ZESTRIL) 20 MG tablet Take 1 tablet (20 mg total) by mouth daily. 90 tablet 3  . LORazepam (ATIVAN) 1 MG tablet Take 1 tablet (1 mg total) by mouth 3 (three) times daily as needed for anxiety. 90 tablet 2  . magnesium oxide (MAG-OX) 400 (241.3 Mg) MG tablet Take 1 tablet (400 mg total) by mouth 2 (two) times daily. 60 tablet 1  . Multiple Vitamin (MULTIVITAMIN WITH MINERALS) TABS tablet Take 1 tablet by mouth daily.    . nicotine (NICODERM CQ - DOSED IN MG/24 HOURS) 21 mg/24hr patch Place 1 patch (21 mg total) onto the skin daily. 28 patch 0  . OLANZapine (ZYPREXA) 10 MG tablet Take 1 tablet (10 mg total) by mouth at bedtime. 90 tablet 0  . omeprazole (PRILOSEC) 40 MG capsule TAKE 1 CAPSULE BY MOUTH TWO TIMES DAILY BEFORE A MEAL 180 capsule 0  . potassium chloride SA (KLOR-CON) 20 MEQ tablet Take 1 tablet (20 mEq total) by mouth daily. 60 tablet 2  . prochlorperazine (COMPAZINE) 10 MG tablet TAKE 1 TABLET BY MOUTH EVERY 6 HOURS AS NEEDED FOR NAUSEA OR VOMITING (Patient taking differently: Take 10 mg by mouth every 6 (six) hours as needed for nausea or vomiting. ) 30 tablet 1  . promethazine (PHENERGAN) 25 MG tablet Take 1 tablet (25 mg total) by mouth every 6 (six) hours as needed for nausea. 30 tablet 3  . rivaroxaban (XARELTO) 20 MG TABS tablet Take 1 tablet (20 mg total) by mouth daily with supper. 30 tablet 0  . rosuvastatin (CRESTOR) 10 MG tablet Take 1 tablet (10 mg total) by mouth daily. 90 tablet 3  . traMADol (ULTRAM) 50 MG tablet Take 1 tablet (50 mg total) by mouth every 6 (six) hours as needed for moderate pain or severe pain (mild pain). 15 tablet 0  . venlafaxine XR (EFFEXOR XR) 37.5 MG 24 hr capsule Take three capsule (112.5 mg total) by mouth daily. 90 capsule 2   No current  facility-administered medications for this visit.   Facility-Administered Medications Ordered in Other Visits  Medication Dose Route Frequency Provider Last Rate Last Admin  . LORazepam (ATIVAN) injection 0.5 mg  0.5 mg Intravenous Once Harle Stanford., PA-C        PHYSICAL EXAMINATION: ECOG PERFORMANCE STATUS: 1 - Symptomatic but completely ambulatory  Vitals:   08/18/20 1231  BP: 120/74  Pulse: 99  Resp: 18  Temp: 98.4 F (36.9 C)  SpO2: 100%   Filed Weights   08/18/20 1231  Weight: 186 lb 3.2 oz (84.5 kg)    LABORATORY DATA:  I have reviewed the data as listed CMP Latest Ref Rng & Units 08/18/2020 07/21/2020 06/26/2020  Glucose 70 - 99 mg/dL 81 108(H) 98  BUN 6 - 20 mg/dL 11 9 16  Creatinine 0.44 - 1.00 mg/dL 0.74 0.74 0.71  Sodium 135 - 145 mmol/L 139 135 137  Potassium 3.5 - 5.1 mmol/L 3.5 4.0 3.9  Chloride 98 - 111 mmol/L 106 104 107  CO2 22 - 32 mmol/L 24 20(L) 25  Calcium 8.9 - 10.3 mg/dL 10.1 10.0 9.5  Total Protein 6.5 - 8.1 g/dL 7.1 7.1 7.1  Total Bilirubin 0.3 - 1.2 mg/dL 0.4 0.3 0.3  Alkaline Phos 38 - 126 U/L 52 40 58  AST 15 - 41 U/L 20 26 33  ALT 0 - 44 U/L 17 24 73(H)    Lab Results  Component Value Date   WBC 7.4 08/18/2020   HGB 11.7 (L) 08/18/2020   HCT 36.4 08/18/2020   MCV 94.3 08/18/2020   PLT 325 08/18/2020   NEUTROABS 5.2 08/18/2020    ASSESSMENT & PLAN:  Malignant neoplasm of upper-inner quadrant of left breast in female, estrogen receptor positive ([place]) Breast cancer and vulvar cancers  02/20/2020:PET scan on 02/07/20 following a diagnosis of vulvar cancer showed a 1.7cm left breast mass and mildly hypermetabolic left axillary lymph nodes. Mammogram and [place] on 02/18/20 showed a 3.1cm left breast mass at the 11 o'clock position, palpable on exam, and a single abnormal left axillary lymph node with cortical thickening. Left breast biopsy on 02/20/20 showed invasive and in situ carcinoma in the breast and axilla, grade 3, HER-2 positive (3+),  ER/PR+ 95%, Ki67 75%  Treatment plan: 1.Neoadjuvant chemoradiation for vulvar cancer with Taxol, carboplatin, Herceptin weekly x6 cycles 2.neoadjuvant chemotherapy with TCH Perjeta x3 cycles 3.breast conserving surgery if possible with sentinel lymph node study 4. Followed by adjuvant radiation therapy if patient had lumpectomy 5.  Kadcyla maintenance 6.  Adjuvant antiestrogen therapy 7.  Adjuvant neratinib ------------------------------------------------------------------------------------------------------------------- 07/27/20: Left mastectomy Sandra Miller): three foci of IDC, 3.7cm, 1.5cm, and 1.2cm, with intermediate to high grade DCIS, clear margins, with 1/2 left axillary lymph nodes positive for carcinoma.    Current treatment: Today cycle 1 Kadcyla Patient understands risks and benefits of Kadcyla and is willing to proceed. Echocardiogram will be obtained every 3 months       No orders of the defined types were placed in this encounter.  The patient has a good understanding of the overall plan. she agrees with it. she will call with any problems that may develop before the next visit here.  Total time spent: 30 mins including face to face time and time spent for planning, charting and coordination of care  Nicholas Lose, MD 08/18/2020  I, Cloyde Reams Dorshimer, am acting as scribe for Dr. Nicholas Lose.  I have reviewed the above documentation for accuracy and completeness, and I agree with the above.

## 2020-08-18 ENCOUNTER — Other Ambulatory Visit: Payer: Self-pay

## 2020-08-18 ENCOUNTER — Inpatient Hospital Stay: Payer: Self-pay

## 2020-08-18 ENCOUNTER — Inpatient Hospital Stay (HOSPITAL_BASED_OUTPATIENT_CLINIC_OR_DEPARTMENT_OTHER): Payer: Self-pay | Admitting: Hematology and Oncology

## 2020-08-18 ENCOUNTER — Inpatient Hospital Stay: Payer: Self-pay | Attending: Gynecologic Oncology

## 2020-08-18 ENCOUNTER — Ambulatory Visit: Payer: Self-pay

## 2020-08-18 VITALS — BP 130/77 | HR 83 | Temp 98.0°F | Resp 18

## 2020-08-18 DIAGNOSIS — K219 Gastro-esophageal reflux disease without esophagitis: Secondary | ICD-10-CM | POA: Insufficient documentation

## 2020-08-18 DIAGNOSIS — Z86718 Personal history of other venous thrombosis and embolism: Secondary | ICD-10-CM | POA: Insufficient documentation

## 2020-08-18 DIAGNOSIS — C50212 Malignant neoplasm of upper-inner quadrant of left female breast: Secondary | ICD-10-CM

## 2020-08-18 DIAGNOSIS — F411 Generalized anxiety disorder: Secondary | ICD-10-CM | POA: Insufficient documentation

## 2020-08-18 DIAGNOSIS — Z170 Estrogen receptor positive status [ER+]: Secondary | ICD-10-CM | POA: Insufficient documentation

## 2020-08-18 DIAGNOSIS — F17210 Nicotine dependence, cigarettes, uncomplicated: Secondary | ICD-10-CM | POA: Insufficient documentation

## 2020-08-18 DIAGNOSIS — Z8544 Personal history of malignant neoplasm of other female genital organs: Secondary | ICD-10-CM | POA: Insufficient documentation

## 2020-08-18 DIAGNOSIS — Z923 Personal history of irradiation: Secondary | ICD-10-CM | POA: Insufficient documentation

## 2020-08-18 DIAGNOSIS — Z9221 Personal history of antineoplastic chemotherapy: Secondary | ICD-10-CM | POA: Insufficient documentation

## 2020-08-18 DIAGNOSIS — F319 Bipolar disorder, unspecified: Secondary | ICD-10-CM | POA: Insufficient documentation

## 2020-08-18 DIAGNOSIS — Z9012 Acquired absence of left breast and nipple: Secondary | ICD-10-CM | POA: Insufficient documentation

## 2020-08-18 DIAGNOSIS — Z7901 Long term (current) use of anticoagulants: Secondary | ICD-10-CM | POA: Insufficient documentation

## 2020-08-18 DIAGNOSIS — Z79899 Other long term (current) drug therapy: Secondary | ICD-10-CM | POA: Insufficient documentation

## 2020-08-18 DIAGNOSIS — Z5112 Encounter for antineoplastic immunotherapy: Secondary | ICD-10-CM | POA: Insufficient documentation

## 2020-08-18 DIAGNOSIS — Z1509 Genetic susceptibility to other malignant neoplasm: Secondary | ICD-10-CM | POA: Insufficient documentation

## 2020-08-18 DIAGNOSIS — Z95828 Presence of other vascular implants and grafts: Secondary | ICD-10-CM

## 2020-08-18 DIAGNOSIS — I82621 Acute embolism and thrombosis of deep veins of right upper extremity: Secondary | ICD-10-CM

## 2020-08-18 DIAGNOSIS — A6000 Herpesviral infection of urogenital system, unspecified: Secondary | ICD-10-CM | POA: Insufficient documentation

## 2020-08-18 LAB — CMP (CANCER CENTER ONLY)
ALT: 17 U/L (ref 0–44)
AST: 20 U/L (ref 15–41)
Albumin: 3.8 g/dL (ref 3.5–5.0)
Alkaline Phosphatase: 52 U/L (ref 38–126)
Anion gap: 9 (ref 5–15)
BUN: 11 mg/dL (ref 6–20)
CO2: 24 mmol/L (ref 22–32)
Calcium: 10.1 mg/dL (ref 8.9–10.3)
Chloride: 106 mmol/L (ref 98–111)
Creatinine: 0.74 mg/dL (ref 0.44–1.00)
GFR, Estimated: >60 mL/min (ref >60)
Glucose, Bld: 81 mg/dL (ref 70–99)
Potassium: 3.5 mmol/L (ref 3.5–5.1)
Sodium: 139 mmol/L (ref 135–145)
Total Bilirubin: 0.4 mg/dL (ref 0.3–1.2)
Total Protein: 7.1 g/dL (ref 6.5–8.1)

## 2020-08-18 LAB — CBC WITH DIFFERENTIAL (CANCER CENTER ONLY)
Abs Immature Granulocytes: 0.02 10*3/uL (ref 0.00–0.07)
Basophils Absolute: 0 10*3/uL (ref 0.0–0.1)
Basophils Relative: 0 %
Eosinophils Absolute: 0.1 10*3/uL (ref 0.0–0.5)
Eosinophils Relative: 1 %
HCT: 36.4 % (ref 36.0–46.0)
Hemoglobin: 11.7 g/dL — ABNORMAL LOW (ref 12.0–15.0)
Immature Granulocytes: 0 %
Lymphocytes Relative: 21 %
Lymphs Abs: 1.6 10*3/uL (ref 0.7–4.0)
MCH: 30.3 pg (ref 26.0–34.0)
MCHC: 32.1 g/dL (ref 30.0–36.0)
MCV: 94.3 fL (ref 80.0–100.0)
Monocytes Absolute: 0.6 10*3/uL (ref 0.1–1.0)
Monocytes Relative: 7 %
Neutro Abs: 5.2 10*3/uL (ref 1.7–7.7)
Neutrophils Relative %: 71 %
Platelet Count: 325 10*3/uL (ref 150–400)
RBC: 3.86 MIL/uL — ABNORMAL LOW (ref 3.87–5.11)
RDW: 14 % (ref 11.5–15.5)
WBC Count: 7.4 10*3/uL (ref 4.0–10.5)
nRBC: 0 % (ref 0.0–0.2)

## 2020-08-18 LAB — PROTIME-INR
INR: 1.1 (ref 0.8–1.2)
Prothrombin Time: 13.6 seconds (ref 11.4–15.2)

## 2020-08-18 MED ORDER — HEPARIN SOD (PORK) LOCK FLUSH 100 UNIT/ML IV SOLN
500.0000 [IU] | Freq: Once | INTRAVENOUS | Status: DC | PRN
  Filled 2020-08-18: qty 5

## 2020-08-18 MED ORDER — SODIUM CHLORIDE 0.9 % IV SOLN
3.6000 mg/kg | Freq: Once | INTRAVENOUS | Status: AC
  Administered 2020-08-18: 300 mg via INTRAVENOUS
  Filled 2020-08-18: qty 15

## 2020-08-18 MED ORDER — SODIUM CHLORIDE 0.9% FLUSH
10.0000 mL | INTRAVENOUS | Status: DC | PRN
  Filled 2020-08-18: qty 10

## 2020-08-18 MED ORDER — PROMETHAZINE HCL 25 MG PO TABS: 25.0000 mg | ORAL_TABLET | Freq: Four times a day (QID) | ORAL | 3 refills | Status: DC | PRN

## 2020-08-18 MED ORDER — DIPHENHYDRAMINE HCL 25 MG PO CAPS
ORAL_CAPSULE | ORAL | Status: AC
  Filled 2020-08-18: qty 2

## 2020-08-18 MED ORDER — ACETAMINOPHEN 325 MG PO TABS
ORAL_TABLET | ORAL | Status: AC
  Filled 2020-08-18: qty 2

## 2020-08-18 MED ORDER — DIPHENHYDRAMINE HCL 25 MG PO CAPS
50.0000 mg | ORAL_CAPSULE | Freq: Once | ORAL | Status: AC
  Administered 2020-08-18: 50 mg via ORAL

## 2020-08-18 MED ORDER — SODIUM CHLORIDE 0.9 % IV SOLN
Freq: Once | INTRAVENOUS | Status: AC
  Filled 2020-08-18: qty 250

## 2020-08-18 MED ORDER — SODIUM CHLORIDE 0.9% FLUSH
10.0000 mL | Freq: Once | INTRAVENOUS | Status: AC
Start: 2020-08-18 — End: 2020-08-18
  Administered 2020-08-18: 10 mL
  Filled 2020-08-18: qty 10

## 2020-08-18 MED ORDER — ACETAMINOPHEN 325 MG PO TABS
650.0000 mg | ORAL_TABLET | Freq: Once | ORAL | Status: AC
  Administered 2020-08-18: 650 mg via ORAL

## 2020-08-18 MED FILL — PROMETHAZINE 25 MG TABLET: 25 | 7 days supply | Qty: 30 | Fill #0

## 2020-08-18 NOTE — Assessment & Plan Note (Addendum)
Breast cancer and vulvar cancers  02/20/2020:PET scan on 02/07/20 following a diagnosis of vulvar cancer showed a 1.7cm left breast mass and mildly hypermetabolic left axillary lymph nodes. Mammogram and [place] on 02/18/20 showed a 3.1cm left breast mass at the 11 o'clock position, palpable on exam, and a single abnormal left axillary lymph node with cortical thickening. Left breast biopsy on 02/20/20 showed invasive and in situ carcinoma in the breast and axilla, grade 3, HER-2 positive (3+), ER/PR+ 95%, Ki67 75%  Treatment plan: 1.Neoadjuvant chemoradiation for vulvar cancer with Taxol, carboplatin, Herceptin weekly x6 cycles 2.neoadjuvant chemotherapy with TCH Perjeta x3 cycles 3.breast conserving surgery if possible with sentinel lymph node study 4. Followed by adjuvant radiation therapy if patient had lumpectomy 5.  Kadcyla maintenance 6.  Adjuvant antiestrogen therapy 7.  Adjuvant neratinib -------------------------------------------------------------------------------------------------------------------07/27/20: Left mastectomy [name]): three foci of IDC, 3.7cm, 1.5cm, and 1.2cm, with intermediate to high grade DCIS, clear margins, with 1/2 left axillary lymph nodes positive for carcinoma.    Current treatment: Today cycle 1 Kadcyla Patient understands risks and benefits of Kadcyla and is willing to proceed. Echocardiogram will be obtained every 3 months

## 2020-08-18 NOTE — Patient Instructions (Signed)
[facility] Discharge Instructions for Patients Receiving Chemotherapy  Today you received the following chemotherapy agent: Kadcyla  To help prevent nausea and vomiting after your treatment, we encourage you to take your nausea medication as prescribed.   If you develop nausea and vomiting that is not controlled by your nausea medication, call the clinic.   BELOW ARE SYMPTOMS THAT SHOULD BE REPORTED IMMEDIATELY:  *FEVER GREATER THAN 100.5 F  *CHILLS WITH OR WITHOUT FEVER  NAUSEA AND VOMITING THAT IS NOT CONTROLLED WITH YOUR NAUSEA MEDICATION  *UNUSUAL SHORTNESS OF BREATH  *UNUSUAL BRUISING OR BLEEDING  TENDERNESS IN MOUTH AND THROAT WITH OR WITHOUT PRESENCE OF ULCERS  *URINARY PROBLEMS  *BOWEL PROBLEMS  UNUSUAL RASH Items with * indicate a potential emergency and should be followed up as soon as possible.  Feel free to call the clinic should you have any questions or concerns. The clinic phone number is [phone].  Please show the [facility] at check-in to the Emergency Department and triage nurse.     Ado-Trastuzumab Emtansine for injection What is this medicine? ADO-TRASTUZUMAB EMTANSINE (ADD oh traz TOO zuh mab em TAN zine) is a monoclonal antibody combined with chemotherapy. It is used to treat breast cancer. This medicine may be used for other purposes; ask your health care provider or pharmacist if you have questions. COMMON BRAND NAME(S): Kadcyla What should I tell my health care provider before I take this medicine? They need to know if you have any of these conditions:  heart disease  heart failure  infection (especially a virus infection such as chickenpox, cold sores, or herpes)  liver disease  lung or breathing disease, like asthma  tingling of the fingers or toes, or other nerve disorder  an unusual or allergic reaction to ado-trastuzumab emtansine, other medications, foods, dyes, or preservatives  pregnant or trying to  get pregnant  breast-feeding How should I use this medicine? This medicine is for infusion into a vein. It is given by a health care professional in a hospital or clinic setting. Talk to your pediatrician regarding the use of this medicine in children. Special care may be needed. Overdosage: If you think you have taken too much of this medicine contact a poison control center or emergency room at once. NOTE: This medicine is only for you. Do not share this medicine with others. What if I miss a dose? It is important not to miss your dose. Call your doctor or health care professional if you are unable to keep an appointment. What may interact with this medicine? This medicine may also interact with the following medications:  atazanavir  boceprevir  clarithromycin  delavirdine  indinavir  dalfopristin; quinupristin  isoniazid, INH  itraconazole  ketoconazole  nefazodone  nelfinavir  ritonavir  telaprevir  telithromycin  tipranavir  voriconazole This list may not describe all possible interactions. Give your health care provider a list of all the medicines, herbs, non-prescription drugs, or dietary supplements you use. Also tell them if you smoke, drink alcohol, or use illegal drugs. Some items may interact with your medicine. What should I watch for while using this medicine? Visit your doctor for checks on your progress. This drug may make you feel generally unwell. This is not uncommon, as chemotherapy can affect healthy cells as well as cancer cells. Report any side effects. Continue your course of treatment even though you feel ill unless your doctor tells you to stop. You may need blood work done while you are  taking this medicine. Call your doctor or health care professional for advice if you get a fever, chills or sore throat, or other symptoms of a cold or flu. Do not treat yourself. This drug decreases your body's ability to fight infections. Try to avoid being  around people who are sick. Be careful brushing and flossing your teeth or using a toothpick because you may get an infection or bleed more easily. If you have any dental work done, tell your dentist you are receiving this medicine. Avoid taking products that contain aspirin, acetaminophen, ibuprofen, naproxen, or ketoprofen unless instructed by your doctor. These medicines may hide a fever. Do not become pregnant while taking this medicine or for 7 months after stopping it, men with female partners should use contraception during treatment and for 4 months after the last dose. Women should inform their doctor if they wish to become pregnant or think they might be pregnant. There is a potential for serious side effects to an unborn child. Do not breast-feed an infant while taking this medicine or for 7 months after the last dose. Men who have a partner who is pregnant or who is capable of becoming pregnant should use a condom during sexual activity while taking this medicine and for 4 months after stopping it. Men should inform their doctors if they wish to father a child. This medicine may lower sperm counts. Talk to your health care professional or pharmacist for more information. What side effects may I notice from receiving this medicine? Side effects that you should report to your doctor or health care professional as soon as possible:  allergic reactions like skin rash, itching or hives, swelling of the face, lips, or tongue  breathing problems  chest pain or palpitations  fever or chills, sore throat  general ill feeling or flu-like symptoms  light-colored stools  nausea, vomiting  pain, tingling, numbness in the hands or feet  signs and symptoms of bleeding such as bloody or black, tarry stools; red or dark-brown urine; spitting up blood or brown material that looks like coffee grounds; red spots on the skin; unusual bruising or bleeding from the eye, gums, or nose  swelling of the  legs or ankles  yellowing of the eyes or skin Side effects that usually do not require medical attention (report to your doctor or health care professional if they continue or are bothersome):  changes in taste  constipation  dizziness  headache  joint pain  muscle pain  trouble sleeping  unusually weak or tired This list may not describe all possible side effects. Call your doctor for medical advice about side effects. You may report side effects to FDA at 1-800-FDA-1088. Where should I keep my medicine? This drug is given in a hospital or clinic and will not be stored at home. NOTE: This sheet is a summary. It may not cover all possible information. If you have questions about this medicine, talk to your doctor, pharmacist, or health care provider.  2020 Elsevier/Gold Standard (2018-01-19 10:03:15)

## 2020-08-18 NOTE — Progress Notes (Signed)
Pt preferred to have covid shot during next infusion appt d/t this being a first time appt.  Scheduling message sent.  Pt tolerated infusion well. Remained for 90 min post observation time without any issues.

## 2020-08-19 ENCOUNTER — Ambulatory Visit
Admission: RE | Admit: 2020-08-19 | Discharge: 2020-08-19 | Disposition: A | Discharge status: Home / Self Care | Payer: Self-pay | Source: Ambulatory Visit | Attending: Radiation Oncology | Admitting: Radiation Oncology

## 2020-08-19 ENCOUNTER — Other Ambulatory Visit: Payer: Self-pay

## 2020-08-19 ENCOUNTER — Encounter: Payer: Self-pay | Admitting: Radiation Oncology

## 2020-08-19 ENCOUNTER — Telehealth: Payer: Self-pay | Admitting: *Deleted

## 2020-08-19 VITALS — BP 126/76 | HR 106 | Temp 96.7°F | Resp 18 | Ht 66.5 in | Wt 186.1 lb

## 2020-08-19 DIAGNOSIS — C519 Malignant neoplasm of vulva, unspecified: Secondary | ICD-10-CM

## 2020-08-19 DIAGNOSIS — Z170 Estrogen receptor positive status [ER+]: Secondary | ICD-10-CM | POA: Insufficient documentation

## 2020-08-19 DIAGNOSIS — Z79899 Other long term (current) drug therapy: Secondary | ICD-10-CM | POA: Insufficient documentation

## 2020-08-19 DIAGNOSIS — C50212 Malignant neoplasm of upper-inner quadrant of left female breast: Secondary | ICD-10-CM

## 2020-08-19 DIAGNOSIS — Z7901 Long term (current) use of anticoagulants: Secondary | ICD-10-CM | POA: Insufficient documentation

## 2020-08-19 DIAGNOSIS — C50912 Malignant neoplasm of unspecified site of left female breast: Secondary | ICD-10-CM

## 2020-08-19 DIAGNOSIS — Z923 Personal history of irradiation: Secondary | ICD-10-CM | POA: Insufficient documentation

## 2020-08-19 NOTE — Progress Notes (Signed)
Radiation Oncology         [phone]) [phone] ________________________________  Name: Sandra Miller MRN: [id number]  Date: 08/19/2020  DOB: 09-Feb-1976  Re-Evaluation Note  CC: Just, Laurita Quint, FNP  Nicholas Lose, MD    ICD-10-CM   1. Vulvar cancer ([place])  C51.9   2. Malignant neoplasm of upper-inner quadrant of left breast in female, estrogen receptor positive ([place])  C50.212    Z17.0     Diagnosis:  Stage IB (ypT2, ypN1a) Multicentric Left Breast, Invasive Ductal Carcinoma with DCIS, ER+ / PR+ / Her2+, Grade 3  Narrative:  The patient returns today to discuss radiation treatment options. She was recently treated by me for clinical stage IB squamous cell carcinoma of the posterior vulva/perineal area. However, she was found to have synchronous breast cancer. History is as follows:  The patient noted a two-week history of left breast lump for which a mammogram was scheduled on 02/06/2020. However, it was cancelled secondary to her PET scan being the following day. She also reported feeling a lump under the right side of her neck.  Bilateral diagnostic mammogram and left breast ultrasound were performed on 02/18/2020 and showed a palpable left breast mass at 11 o'clock that measured 3.1 cm and was highly suspicious. There were associated suspicious calcifications that extended posterior to the mass. The overall area of concern measured approximately 4.3 cm. Additionally, there was suggestion of tethering to the skin mammographically. Finally, there was a single abnormal left axillary lymph node.  Biopsy of the upper inner left breast 02/18/2020 showed grade 3 invasive mammary carcinoma and mammary carcinoma in-situ. E-cadherin was positive, supporting a ductal origin. The left axillary lymph node was also biopsied and showed metastatic carcinoma. Prognostic indicators were significant for Estrogen Receptor 95% positive with moderate staining intensity and Progesterone Receptor 95% positive with  strong staining intensity. Ki67 proliferation marker was 75%. Her2 positive.  Biopsy of the lower inner left breast on 02/20/2020 showed grade 3 invasive ductal carcinoma and ductal carcinoma in-situ with calcifications and necrosis.  The patient was referred to Dr. Lindi Adie and was seen in consultation on 02/25/2020. At that time, it was recommended that the patient proceed with neoadjuvant chemotherapy with [facility] Perjeta x6 cycles followed by Herceptin maintenance for one year. Chemotherapy would be followed by breast conserving surgery with possible sentinel lymph node study and then by adjuvant radiation therapy if the patient underwent a lumpectomy. Considering the patient's vulvar cancer, Dr. Lindi Adie recommended beginning vulvar radiation along with Caroplatin and Herceptin followed by vulvar surgery.  Based on the location of the lesion however the patient was not felt to be a good candidate for surgery and therefore underwent definitive radiation therapy.  Subsequently, she could start neoadjuvant chemotherapy for her breast cancer.  MRI of bilateral breasts on 02/27/2020 showed a 4.2 cm mass in the medial left breast at approximately 2:30 o'clock, middle depth, reflecting the recently diagnosed left breast carcinoma. There was also a small adjacent mass in the central left breast that measured 9 mm in long axis and could reflect a satellite carcinoma. Additionally, there was a third indeterminate enhancing mass in the posterolateral left breast at the approximate 9:30 o'clock position. Finally, in the right breast, there was a linear area of non-mass enhancement spanning a length of 5.7 cm that was projecting in the medial breast and appeared more prominent than the marked background enhancement, possibly reflecting an area of DCIS.  Biopsy of submental neck lymph node on 03/05/2020 was negative for  metastatic carcinoma. Biopsy of left central breast on 03/05/2020 showed grade 1 microinvasive ductal  carcinoma with ductal carcinoma in situ and complex sclerosing lesion. Prognostic indicators were significant for: Estrogen Receptor 75% positive with a moderate staining intensity and Progesterone Receptor 0% negative. Proliferation Marker Ki67 was 40%. Her2 positive. Left superior lateral breast was benign.   Biopsy of the right medial anterior and medial posterior breast on 03/10/2020 showed fibrocystic changes with usual ductal hyperplasia but no malignancy.  Of note, the patient was seen in the ED on 04/11/2020 with complaints of fever, right-sided throat pain, and dry cough. Soft tissue CT scan of neck at that time showed an occlusive thrombus within the mid to inferior right internal jugular vein with thrombus likely extending to surround portions of the right IJ approach infusion port catheter. There were surrounding inflammatory changes within the right neck. There was no appreciable swelling or discrete mass within the oral cavity, pharynx, or larynx. There was no retropharyngeal fluid collected. Dr. Alen Blew was consulted and recommended outpatient treatment for DVT with Xarelto and continued follow-up with medical oncology.  The patient completed six cycles of Taxol, Carboplatin, and Herceptin under the care of Dr. Lindi Adie from 03/11/2020 - 05/04/2020 for vulvar cancer. She began [facility] Perjeta x3 cycles on 05/26/2020.  Genetic testing on 06/22/2020 revealed a heterozygous pathogenic variant in the BRIP1 gene.  MRI of bilateral breasts on 06/24/2020 showed no significant change in the size or appearance of the spiculated mass in the medial central aspect of the left breast. The small satellite malignant nodule that was previously noted in the central portion of the left breast was not well seen on this exam. However, there was a discrete enhancing mass in the upper outer quadrant of the left breast at the site of the previous MR-guided core biopsy that showed benign breast tissue, which remained  suspicious despite benign histology at biopsy.   The patient underwent a left mastectomy with targeted axillary lymph node dissection and deep sentinel lymph node biopsy on 07/27/2020 under the care of Dr. [name]. Pathology from the procedure revealed three foci of invasive ductal carcinoma (3.7 cm, 1.5 cm, and 1.2 cm) with intermediate to high-grade ductal carcinoma in situ with necrosis and calcifications. Lymphovascular invasion was present. Resection margins were negative for carcinoma. Left superior skin flap was negative for carcinoma. However, there was metastatic carcinoma found in one of two left axillary lymph nodes with evidence of extranodal extension. The largest focus of metastatic carcinoma measured 0.3 cm.  She was last seen by Dr. Lindi Adie yesterday, 08/18/2020, during which time she was started on maintenance Kadcycla.   On review of systems, the patient reports numbness of the left posterior upper arm and left chest wall. She also reports having had daily headaches while liver enzymes were elevated (managed with Tylenol and have improved) and nausea that is controlled with Compazine/Phenergan). She denies pain at surgical site, weight change, and bowel/bladder complaints. Skin irritation associated with GYN radiation has resolved.  She reports not being able to feel a residual mass in this area.  The patient has not undergone a detailed exam since completion of her radiation therapy concerning her vulvar carcinoma but will see Dr. Denman George on December 17 for detailed exam.   Allergies:  is allergic to dilaudid [hydromorphone hcl], depakote [divalproex sodium], minocycline, and aspirin.  Meds: Current Outpatient Medications  Medication Sig Dispense Refill   acetaminophen (TYLENOL) 500 MG tablet Take 2 tablets (1,000 mg total) by mouth every 6 (  six) hours as needed for moderate pain or fever. Avoid tylenol until your liver function back to normal 30 tablet 0   diphenoxylate-atropine  (LOMOTIL) 2.5-0.025 MG tablet TAKE 1 TO 2 TABLETS BY MOUTH FOUR TIMES DAILY AS NEEDED FOR DIARRHEA (Patient taking differently: Take 1-2 tablets by mouth 4 (four) times daily as needed for diarrhea or loose stools. TAKE 1 TO 2 TABLETS BY MOUTH FOUR TIMES DAILY AS NEEDED FOR DIARRHEA) 45 tablet 1   lidocaine-prilocaine (EMLA) cream Apply to affected area once 30 g 3   lisinopril (ZESTRIL) 20 MG tablet Take 1 tablet (20 mg total) by mouth daily. 90 tablet 3   LORazepam (ATIVAN) 1 MG tablet Take 1 tablet (1 mg total) by mouth 3 (three) times daily as needed for anxiety. 90 tablet 2   magnesium oxide (MAG-OX) 400 (241.3 Mg) MG tablet Take 1 tablet (400 mg total) by mouth 2 (two) times daily. 60 tablet 1   Multiple Vitamin (MULTIVITAMIN WITH MINERALS) TABS tablet Take 1 tablet by mouth daily.     nicotine (NICODERM CQ - DOSED IN MG/24 HOURS) 21 mg/24hr patch Place 1 patch (21 mg total) onto the skin daily. 28 patch 0   OLANZapine (ZYPREXA) 10 MG tablet Take 1 tablet (10 mg total) by mouth at bedtime. 90 tablet 0   omeprazole (PRILOSEC) 40 MG capsule TAKE 1 CAPSULE BY MOUTH TWO TIMES DAILY BEFORE A MEAL 180 capsule 0   potassium chloride SA (KLOR-CON) 20 MEQ tablet Take 1 tablet (20 mEq total) by mouth daily. 60 tablet 2   rivaroxaban (XARELTO) 20 MG TABS tablet Take 1 tablet (20 mg total) by mouth daily with supper. 30 tablet 0   rosuvastatin (CRESTOR) 10 MG tablet Take 1 tablet (10 mg total) by mouth daily. 90 tablet 3   venlafaxine XR (EFFEXOR XR) 37.5 MG 24 hr capsule Take three capsule (112.5 mg total) by mouth daily. 90 capsule 2   dronabinol (MARINOL) 2.5 MG capsule Take 1 capsule (2.5 mg total) by mouth 2 (two) times daily before a meal. (Patient not taking: Reported on 08/19/2020) 60 capsule 0   promethazine (PHENERGAN) 25 MG tablet Take 1 tablet (25 mg total) by mouth every 6 (six) hours as needed for nausea. (Patient not taking: Reported on 08/19/2020) 30 tablet 3   No current  facility-administered medications for this encounter.   Facility-Administered Medications Ordered in Other Encounters  Medication Dose Route Frequency Provider Last Rate Last Admin   LORazepam (ATIVAN) injection 0.5 mg  0.5 mg Intravenous Once Harle Stanford., PA-C        Physical Findings: The patient is in no acute distress. Patient is alert and oriented.  height is 5' 6.5" (1.689 m) and weight is 186 lb 2 oz (84.4 kg). Her temporal temperature is 96.7 F (35.9 C) (abnormal). Her blood pressure is 126/76 and her pulse is 106 (abnormal). Her respiration is 18 and oxygen saturation is 99%.   Lungs are clear to auscultation bilaterally. Heart has regular rate and rhythm. No palpable cervical, supraclavicular, or axillary adenopathy. Abdomen soft, non-tender, normal bowel sounds. Right breast: no palpable mass, nipple discharge or bleeding. Left chest wall area shows a mastectomy scar which is healing well without signs of drainage or infection.  Patient is to have some limitation of the left arm mobility in light of her axillary dissection.  Lab Findings: Lab Results  Component Value Date   WBC 7.4 08/18/2020   HGB 11.7 (L) 08/18/2020   HCT 36.4  08/18/2020   MCV 94.3 08/18/2020   PLT 325 08/18/2020    Radiographic Findings: NM Sentinel Node Inj-No Rpt (Breast)  Result Date: 07/27/2020 Sulfur colloid was injected by the nuclear medicine technologist for melanoma sentinel node.   MM Breast Surgical Specimen  Result Date: 07/27/2020 CLINICAL DATA:  Status post excisional biopsy of the left breast. EXAM: SPECIMEN RADIOGRAPH OF THE LEFT BREAST COMPARISON:  Previous exam(s). FINDINGS: Status post excision of the left breast. The radioactive seed and biopsy marker clip are present, completely intact, and were marked for pathology. IMPRESSION: Specimen radiograph of the left breast. Electronically Signed   By: Lillia Mountain M.D.   On: 07/27/2020 15:22   ECHOCARDIOGRAM COMPLETE  Result Date:  08/10/2020    ECHOCARDIOGRAM REPORT   Patient Name:   Shyvonne C Curt Date of Exam: 08/10/2020 Medical Rec #:  [id number]       Height:       65.5 in Accession #:    [id number]      Weight:       187.5 lb Date of Birth:  [birth year]       BSA:          1.936 m Patient Age:    44 years        BP:           128/81 mmHg Patient Gender: F               HR:           97 bpm. Exam Location:  Outpatient Procedure: 2D Echo Indications:    chemotherapy evaluation  History:        Patient has prior history of Echocardiogram examinations, most                 recent 03/02/2020. Risk Factors:Hypertension and Current Smoker.                 Cancer.  Sonographer:    Jannett Celestine RDCS (AE) Referring Phys: [id number] Nicholas Lose  Sonographer Comments: see comments IMPRESSIONS  1. GLS -12.0% (underestimated due to poo endocardial tracking). . Left ventricular ejection fraction, by estimation, is 65 to 70%. The left ventricle has normal function. The left ventricle has no regional wall motion abnormalities. Left ventricular diastolic parameters are consistent with Grade I diastolic dysfunction (impaired relaxation).  2. Right ventricular systolic function is normal. The right ventricular size is normal.  3. The mitral valve is normal in structure. Trivial mitral valve regurgitation.  4. The aortic valve was not well visualized. Aortic valve regurgitation is not visualized.  5. The inferior vena cava is normal in size with greater than 50% respiratory variability, suggesting right atrial pressure of 3 mmHg. FINDINGS  Left Ventricle: GLS -12.0% (underestimated due to poo endocardial tracking). Left ventricular ejection fraction, by estimation, is 65 to 70%. The left ventricle has normal function. The left ventricle has no regional wall motion abnormalities. The left ventricular internal cavity size was normal in size. There is no left ventricular hypertrophy. Left ventricular diastolic parameters are consistent with Grade I diastolic  dysfunction (impaired relaxation). Right Ventricle: The right ventricular size is normal. No increase in right ventricular wall thickness. Right ventricular systolic function is normal. Left Atrium: Left atrial size was normal in size. Right Atrium: Right atrial size was normal in size. Pericardium: There is no evidence of pericardial effusion. Mitral Valve: The mitral valve is normal in structure. Trivial mitral valve regurgitation. Tricuspid Valve: The tricuspid valve is  normal in structure. Tricuspid valve regurgitation is not demonstrated. Aortic Valve: The aortic valve was not well visualized. Aortic valve regurgitation is not visualized. Pulmonic Valve: The pulmonic valve was not well visualized. Pulmonic valve regurgitation is not visualized. Aorta: The aortic root and ascending aorta are structurally normal, with no evidence of dilitation. Venous: The inferior vena cava is normal in size with greater than 50% respiratory variability, suggesting right atrial pressure of 3 mmHg. IAS/Shunts: No atrial level shunt detected by color flow Doppler.  LEFT VENTRICLE PLAX 2D LVIDd:         3.90 cm  Diastology LVIDs:         2.70 cm  LV e' medial:    6.64 cm/s LV PW:         1.10 cm  LV E/e' medial:  8.8 LV IVS:        0.90 cm  LV e' lateral:   6.64 cm/s LVOT diam:     2.00 cm  LV E/e' lateral: 8.8 LV SV:         48 LV SV Index:   25 LVOT Area:     3.14 cm  RIGHT VENTRICLE RV S prime:     14.80 cm/s TAPSE (M-mode): 1.6 cm LEFT ATRIUM         Index LA diam:    2.30 cm 1.19 cm/m  AORTIC VALVE LVOT Vmax:   81.60 cm/s LVOT Vmean:  63.700 cm/s LVOT VTI:    0.152 m  AORTA Ao Root diam: 2.90 cm MITRAL VALVE MV Area (PHT): 3.12 cm    SHUNTS MV Decel Time: 243 msec    Systemic VTI:  0.15 m MV E velocity: 58.70 cm/s  Systemic Diam: 2.00 cm MV A velocity: 87.40 cm/s MV E/A ratio:  0.67 Glori Bickers MD Electronically signed by Glori Bickers MD Signature Date/Time: 08/10/2020/12:52:19 PM    Final    [place] LT RADIOACTIVE  SEED LOC  Result Date: 07/24/2020 CLINICAL DATA:  Patient for preoperative localization of left axillary lymph node prior to left mastectomy and targeted lymph node dissection. EXAM: ULTRASOUND GUIDED RADIOACTIVE SEED LOCALIZATION OF THE LEFT BREAST COMPARISON:  Previous exam(s). FINDINGS: Patient presents for radioactive seed localization prior to left mastectomy and targeted lymph node resection. I met with the patient and we discussed the procedure of seed localization including benefits and alternatives. We discussed the high likelihood of a successful procedure. We discussed the risks of the procedure including infection, bleeding, tissue injury and further surgery. We discussed the low dose of radioactivity involved in the procedure. Informed, written consent was given. The usual time-out protocol was performed immediately prior to the procedure. Using ultrasound guidance, sterile technique, 1% lidocaine and an I-125 radioactive seed, left axillary lymph node was localized using a lateral approach. The follow-up mammogram images confirm the seed in the expected location and were marked for Dr. [name]. Follow-up survey of the patient confirms presence of the radioactive seed. Order number of I-125 seed:  [id number]. Total activity:  5.427 millicuries reference Date: 07/15/2020 The patient tolerated the procedure well and was released from the [place]. She was given instructions regarding seed removal. IMPRESSION: Radioactive seed localization left breast. No apparent complications. Electronically Signed   By: Lovey Newcomer M.D.   On: 07/24/2020 14:17   MM CLIP PLACEMENT LEFT  Result Date: 07/24/2020 CLINICAL DATA:  Patient for preoperative localization prior to left targeted lymph node dissection and left mastectomy EXAM: DIAGNOSTIC LEFT MAMMOGRAM POST ULTRASOUND-GUIDED RADIOACTIVE SEED PLACEMENT  COMPARISON:  Previous exam(s). FINDINGS: Mammographic images were obtained following ultrasound-guided  radioactive seed placement. These demonstrate radioactive seed to be appropriately located adjacent to the biopsy clip within the left axilla. IMPRESSION: Appropriate location of the radioactive seed. Final Assessment: Post Procedure Mammograms for Seed Placement Electronically Signed   By: Lovey Newcomer M.D.   On: 07/24/2020 14:18    Impression: Stage IB (ypT2, ypN1a) Multicentric Left Breast, Invasive Ductal Carcinoma with DCIS, ER+ / PR+ / Her2+, Grade 3  Patient is at significant risk for recurrence along the left chest wall and axillary region and I would recommend postmastectomy radiation therapy to reduce the chances for recurrence I discussed the course of treatment side effects and potential long-term toxicities of radiation therapy in this situation with the patient and her son.  She appears to understand and wishes to proceed with planned course of treatment.  Plan:  Patient will be scheduled for CT simulation the week of December 27 with treatments to begin the first week in January.  The patient will receive 6 weeks of postmastectomy radiation therapy encompassing the left chest wall axillary and supraclavicular region.  Cardiac sparing techniques will be used if necessary.  Total time spent in this encounter was 45 minutes which included reviewing the patient's extensive history on breast cancer, MRIs, ultrasounds, biopsies, pathology reports, treatment, mastectomy, ED visits, physical examination, and documentation.  -----------------------------------  Blair Promise, PhD, MD  This document serves as a record of services personally performed by Gery Pray, MD. It was created on his behalf by Clerance Lav, a trained medical scribe. The creation of this record is based on the scribe's personal observations and the provider's statements to them. This document has been checked and approved by the attending provider.

## 2020-08-19 NOTE — Progress Notes (Signed)
Weight stable. Heart rate elevated. Denies pain at surgical site. Reports continued numbness left posterior upper arm and left chest wall. Chest wall incision well approximated without redness, drainage or edema. Patient demonstrates full ROM of both upper extremities. Will follow up with surgeon in six months. Evaluated by Dr. Lindi Adie yesterday and started on maintenance Kadcycla. Reports daily headache while liver enzymes were elevated since she was unable to take Tylenol. Liver enzymes have return to normal, she resume tylenol, and headaches have improved. Manages nausea with Compazine but transitioning presently to Phenergan. Denies any bowel or bladder complaints. Reports skin irritation associated with GYN radiation has resolved.   BP 126/76 (BP Location: Left Arm, Patient Position: Sitting)   Pulse (!) 106   Temp (!) 96.7 F (35.9 C) (Temporal)   Resp 18   Ht 5' 6.5" (1.689 m)   Wt 186 lb 2 oz (84.4 kg)   SpO2 99%   BMI 29.59 kg/m  Wt Readings from Last 3 Encounters:  08/19/20 186 lb 2 oz (84.4 kg)  08/18/20 186 lb 3.2 oz (84.5 kg)  08/11/20 189 lb (85.7 kg)

## 2020-08-19 NOTE — Addendum Note (Signed)
Encounter addended by: Heywood Footman, RN on: 08/19/2020 1:35 PM  Actions taken: Visit diagnoses modified

## 2020-08-20 ENCOUNTER — Telehealth: Payer: Self-pay | Admitting: Hematology and Oncology

## 2020-08-20 ENCOUNTER — Telehealth: Payer: Self-pay

## 2020-08-20 ENCOUNTER — Encounter: Payer: Self-pay | Admitting: Gynecologic Oncology

## 2020-08-20 NOTE — Telephone Encounter (Signed)
No 12/14 los, no changes made to pt schedule   

## 2020-08-20 NOTE — Telephone Encounter (Signed)
TC to patient to go over meaningful use questions.  No answer, left message to return call.

## 2020-08-21 ENCOUNTER — Encounter: Payer: Self-pay | Admitting: Gynecologic Oncology

## 2020-08-21 ENCOUNTER — Other Ambulatory Visit: Payer: Self-pay

## 2020-08-21 ENCOUNTER — Inpatient Hospital Stay (HOSPITAL_BASED_OUTPATIENT_CLINIC_OR_DEPARTMENT_OTHER): Payer: Self-pay | Admitting: Gynecologic Oncology

## 2020-08-21 VITALS — BP 128/75 | HR 115 | Temp 97.4°F | Resp 16 | Ht 65.5 in | Wt 188.0 lb

## 2020-08-21 DIAGNOSIS — Z1589 Genetic susceptibility to other disease: Secondary | ICD-10-CM

## 2020-08-21 DIAGNOSIS — C50212 Malignant neoplasm of upper-inner quadrant of left female breast: Secondary | ICD-10-CM

## 2020-08-21 DIAGNOSIS — Z8544 Personal history of malignant neoplasm of other female genital organs: Secondary | ICD-10-CM

## 2020-08-21 DIAGNOSIS — Z1509 Genetic susceptibility to other malignant neoplasm: Secondary | ICD-10-CM

## 2020-08-21 DIAGNOSIS — C519 Malignant neoplasm of vulva, unspecified: Secondary | ICD-10-CM

## 2020-08-21 DIAGNOSIS — Z1501 Genetic susceptibility to malignant neoplasm of breast: Secondary | ICD-10-CM

## 2020-08-21 NOTE — Progress Notes (Signed)
Consult Note: Gyn-Onc  Consult was requested by Dr. Lindi Adie for the evaluation of Ta C Shorten 44 y.o. female  CC:  Chief Complaint  Patient presents with  . Vulvar cancer ([place])  . BRIP1 mutation    Assessment/Plan:  [name]  is a 44 y.o.  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   I am recommending risk reducing bilateral salpingo-oophorectomy.  I do not recommend hysterectomy for this patient given that this particular gene mutation is not associated with the known increased risk for uterine cancer, and the patient has received low pelvic radiation which places her at increased risk for surgical complications associated with hysterectomy, particularly urologic injury or nonhealing vaginal cuff.  She does not require BSO for hormone modulation as she is menopausal secondary to pelvic radiation and high-dose chemotherapy in a perimenopausal age.  She is currently receiving active therapy for her breast cancer with planned chest wall radiation and she is currently receiving trastuzumab.  Given that scan showed no evidence of ovarian or fallopian tube disease at present, I am recommending follow-up after she is completed her trastuzumab infusions.  At that time we will discuss scheduling her for surgery in the late summer early fall 2022.  Anticipate surgery will be a robotic assisted BSO.  With respect to her vulvar cancer she has had a complete response to definitive external beam radiation therapy.  I am recommending 3 monthly surveillance visits with vulvar inspections.  She will next follow-up with Dr. Sondra Come and then with me in June 2022 when we begin to discuss her surgery with more specificity.  HPI: Ms Sandra Miller is a 44 year old parous woman with a history of vulvar cancer who was seen in consultation at the request of Dr  Lindi Adie for evaluation of a deleterious mutation in [place].  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  At the time of seeking consultation for sentinel lymph node biopsy with Dr. Thurston Pounds at [facility], it was determined that she was not a good candidate for primary radical vulvectomy due to the close proximity of the lesion to her anal sphincter.  Therefore she was treated with definitive external beam radiation to the lower pelvis and vulvar tissues.  Please refer to Dr. Clabe Seal notes for specifics regarding radiation.  Simultaneous to her diagnosis of vulvar cancer was a diagnosis of ER/PR positive breast cancer on the left.  This was stage II with positive lymph node.  She underwent treatment with Adriamycin and Taxotere followed by left mastectomy, trastuzumab infusion for HER-2 positive lesion, and was planned for chest wall radiation beginning in the new year of 2022.  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  She has begun trastuzumab infusions but has a planned 8 infusions.  The patient's family history is remarkable for a maternal cousin with ovarian cancer however no other the ovarian cancer in the family.  Current Meds:  Outpatient Encounter Medications as of 08/21/2020  Medication Sig  . acetaminophen (TYLENOL) 500 MG tablet Take 2 tablets (1,000 mg total) by mouth every 6 (six) hours as needed for  moderate pain or fever. Avoid tylenol until your liver function back to normal  . diphenoxylate-atropine (LOMOTIL) 2.5-0.025 MG tablet TAKE 1 TO 2 TABLETS BY MOUTH FOUR TIMES DAILY AS NEEDED FOR DIARRHEA (Patient taking differently: Take 1-2 tablets by mouth 4 (four) times daily as needed for diarrhea or loose stools. TAKE 1 TO 2 TABLETS BY MOUTH FOUR  TIMES DAILY AS NEEDED FOR DIARRHEA)  . dronabinol (MARINOL) 2.5 MG capsule Take 1 capsule (2.5 mg total) by mouth 2 (two) times daily before a meal.  . lidocaine-prilocaine (EMLA) cream Apply to affected area once  . lisinopril (ZESTRIL) 20 MG tablet Take 1 tablet (20 mg total) by mouth daily.  Marland Kitchen LORazepam (ATIVAN) 1 MG tablet Take 1 tablet (1 mg total) by mouth 3 (three) times daily as needed for anxiety.  . magnesium oxide (MAG-OX) 400 (241.3 Mg) MG tablet Take 1 tablet (400 mg total) by mouth 2 (two) times daily.  . Multiple Vitamin (MULTIVITAMIN WITH MINERALS) TABS tablet Take 1 tablet by mouth daily.  . nicotine (NICODERM CQ - DOSED IN MG/24 HOURS) 21 mg/24hr patch Place 1 patch (21 mg total) onto the skin daily.  Marland Kitchen OLANZapine (ZYPREXA) 10 MG tablet Take 1 tablet (10 mg total) by mouth at bedtime.  Marland Kitchen omeprazole (PRILOSEC) 40 MG capsule TAKE 1 CAPSULE BY MOUTH TWO TIMES DAILY BEFORE A MEAL  . potassium chloride SA (KLOR-CON) 20 MEQ tablet Take 1 tablet (20 mEq total) by mouth daily.  . promethazine (PHENERGAN) 25 MG tablet Take 1 tablet (25 mg total) by mouth every 6 (six) hours as needed for nausea.  . rivaroxaban (XARELTO) 20 MG TABS tablet Take 1 tablet (20 mg total) by mouth daily with supper.  . rosuvastatin (CRESTOR) 10 MG tablet Take 1 tablet (10 mg total) by mouth daily.  Marland Kitchen venlafaxine XR (EFFEXOR XR) 37.5 MG 24 hr capsule Take three capsule (112.5 mg total) by mouth daily.   Facility-Administered Encounter Medications as of 08/21/2020  Medication  . LORazepam (ATIVAN) injection 0.5 mg    Allergy:  Allergies  Allergen Reactions  . Dilaudid [Hydromorphone Hcl] Other (See Comments)    Pt became confused, pulled out iv's, does not remember anything  . Depakote [Divalproex Sodium] Nausea And Vomiting  . Minocycline Hives  . Aspirin Hives    States able to tolerate Goody Powders without any problem except for GI upset    Social Hx:   Social History   Socioeconomic History   . Marital status: Single    Spouse name: Not on file  . Number of children: Not on file  . Years of education: Not on file  . Highest education level: Not on file  Occupational History  . Not on file  Tobacco Use  . Smoking status: Current Every Day Smoker    Packs/day: 0.50    Years: 22.00    Pack years: 11.00    Types: Cigarettes  . Smokeless tobacco: Never Used  . Tobacco comment: Recently started a smoking cessation class.   Vaping Use  . Vaping Use: Never used  Substance and Sexual Activity  . Alcohol use: Not Currently    Alcohol/week: 0.0 standard drinks    Comment:  hx alcohollism  in remission since 2014  . Drug use: No    Comment: hx  marijuana use  . Sexual activity: Yes    Partners: Male    Birth control/protection: Surgical  Other Topics Concern  . Not on file  Social History Narrative  Former [facility] patient.      Was on disability at one point.   Return to the workforce.  40 hours a week at [employer], 10 hours a week on the weekends at [employer] in [place] at night 10 to 12 hours a week.      Has grown children, she lives alone with a pet, continues to smoke no alcohol or drug use at this time      History of EtOH abuse and THC use.         Social Determinants of Health   Financial Resource Strain: Not on file  Food Insecurity: No Food Insecurity  . Worried About Charity fundraiser in the Last Year: Never true  . Ran Out of Food in the Last Year: Never true  Transportation Needs: No Transportation Needs  . Lack of Transportation (Medical): No  . Lack of Transportation (Non-Medical): No  Physical Activity: Not on file  Stress: Not on file  Social Connections: Not on file  Intimate Partner Violence: Not on file    Past Surgical Hx:  Past Surgical History:  Procedure Laterality Date  . CESAREAN SECTION  1995   w/  Bilateral Tubal Ligation  . COLONOSCOPY  last one 08-09-2013  . CYSTOSCOPY W/ URETERAL STENT  PLACEMENT Left 03/29/2016   Procedure: CYSTOSCOPY WITH STENT REPLACEMENT;  Surgeon: Nickie Retort, MD;  Location: [facility];  Service: Urology;  Laterality: Left;  . CYSTOSCOPY WITH RETROGRADE PYELOGRAM, URETEROSCOPY AND STENT PLACEMENT Left 03/08/2016   Procedure: CYSTOSCOPY WITH  LEFT RETROGRADE PYELOGRAM, AND STENT PLACEMENT;  Surgeon: Nickie Retort, MD;  Location: [facility];  Service: Urology;  Laterality: Left;  . CYSTOSCOPY/RETROGRADE/URETEROSCOPY/STONE EXTRACTION WITH BASKET Left 03/29/2016   Procedure: CYSTOSCOPY/RETROGRADE/URETEROSCOPY/STONE EXTRACTION WITH BASKET;  Surgeon: Nickie Retort, MD;  Location[facility];  Service: Urology;  Laterality: Left;  . ENDOMETRIAL ABLATION W/ NOVASURE  04-01-2010  . ESOPHAGOGASTRODUODENOSCOPY  last one 08-09-2013  . KNEE ARTHROSCOPY Left as teen  . LASER ABLATION OF THE CERVIX  2012 approx  . MASTECTOMY WITH AXILLARY LYMPH NODE DISSECTION Left 07/27/2020   Procedure: LEFT MASTECTOMY WITH LEFT RADIOACTIVE SEED GUIDED TARGETED AXILLARY LYMPH NODE DISSECTION;  Surgeon: Alphonsa Overall, MD;  Location: [place];  Service: General;  Laterality: Left;  . PORTACATH PLACEMENT Right 03/06/2020   Procedure: INSERTION PORT-A-CATH WITH ULTRASOUND GUIDANCE;  Surgeon: Alphonsa Overall, MD;  Location: [place];  Service: General;  Laterality: Right;  . TRANSTHORACIC ECHOCARDIOGRAM  05-19-2006   lvsf normal, ef 55-65%, there was mild flattening of the interventricular septum during diastoli/  RV size at upper limits normal  . TUBAL LIGATION    . WISDOM TOOTH EXTRACTION  age 89 's    Past Medical Hx:  Past Medical History:  Diagnosis Date  . Bipolar 1 disorder ([place])   . Cancer ([place])   . Complication of anesthesia    wakes up during procedures  . Depression   . GAD (generalized anxiety disorder)   . Genital HSV    currently per pt  no break out 03-22-2016   . GERD (gastroesophageal reflux disease)   . Hiatal  hernia   . History of cervical dysplasia    2012 laser ablation  . History of esophageal dilatation    for dysphasia -- x2 dilated  . History of gastric ulcer   . History of Helicobacter pylori infection    remote hx  . History of hidradenitis suppurativa    "  gets all over body intermittantly"    . History of hypertension    no issue since stopped drinking alcohol 2014  . History of kidney stones   . History of panic attacks   . Hypertension   . Iron deficiency anemia   . Left ureteral stone   . OCD (obsessive compulsive disorder)   . PONV (postoperative nausea and vomiting)   . PTSD (post-traumatic stress disorder)   . Recovering alcoholic in remission ([place])    since 2014  . RLS (restless legs syndrome)   . Smokers' cough ([place])   . Urgency of urination   . Yeast infection involving the vagina and surrounding area    secondary to taking antibiotic    Past Gynecological History:  See HPI, hx of cesarean sections No LMP recorded. Patient has had an ablation.  Family Hx:  Family History  Problem Relation Age of Onset  . Heart disease Father   . Lung cancer Father        d. 81  . Alcohol abuse Father   . Heart disease Mother   . Depression Mother   . Anxiety disorder Mother   . Drug abuse Brother   . Alcohol abuse Brother   . Drug abuse Brother   . ADD / ADHD Brother   . Colon polyps Brother   . Cancer Paternal Grandfather        "stomach"  . Diabetes Maternal Grandfather   . Diabetes Paternal Grandmother   . Kidney disease Maternal Uncle   . Cirrhosis Cousin        alcoholic  . Anxiety disorder Maternal Aunt   . Depression Maternal Aunt   . Cancer Cousin        maternal; ovarian cancer or other "female" cancer?  . Lung cancer Paternal Uncle 54  . Throat cancer Cousin        paternal; dx 82s  . Lung cancer Cousin        paternal; dx 9s    Review of Systems:  Constitutional  + hot flashes   ENT Normal appearing ears and nares bilaterally Skin/Breast   No rash, sores, jaundice, itching, dryness Cardiovascular  No chest pain, shortness of breath, or edema  Pulmonary  No cough or wheeze.  Gastro Intestinal  No nausea, vomitting, or diarrhoea. No bright red blood per rectum, no abdominal pain, change in bowel movement, or constipation.  Genito Urinary  No frequency, urgency, dysuria, no hematuria, no vulvar pruritis. She has amenorrhea since uterine ablation. Musculo Skeletal  No myalgia, arthralgia, joint swelling or pain  Neurologic  No weakness, numbness, change in gait,  Psychology  No depression, anxiety, insomnia.   Vitals:  Blood pressure 128/75, pulse (!) 115, temperature (!) 97.4 F (36.3 C), temperature source Tympanic, resp. rate 16, height 5' 5.5" (1.664 m), weight 188 lb (85.3 kg), SpO2 100 %.  Physical Exam: WD in NAD Neck  Supple NROM, without any enlargements.  Lymph Node Survey No cervical supraclavicular or inguinal adenopathy Cardiovascular  Pulse normal rate, regularity and rhythm. S1 and S2 normal.  Lungs  Clear to auscultation bilateraly, without wheezes/crackles/rhonchi. Good air movement.  Skin  No rash/lesions/breakdown  Psychiatry  Alert and oriented to person, place, and time  Abdomen  Normoactive bowel sounds, abdomen soft, non-tender and mildly obese without evidence of hernia.  Back No CVA tenderness Genito Urinary  Vulva/vagina: Normal external female genitalia.   No lesions. No discharge or bleeding. Acetic acid was applied and no lesions were  seen.   Bladder/urethra:  No lesions or masses, well supported bladder  Vagina: normal  Cervix: Normal appearing, no lesions.  Uterus:  Small, mobile, no parametrial involvement or nodularity.  Adnexa: no palpable masses. Rectal  deferred Extremities  No bilateral cyanosis, clubbing or edema.  60 minutes of total time was spent for this patient encounter, including preparation, face-to-face counseling with the patient and coordination of care,  review of imaging (results and images), communication with the referring provider and documentation of the encounter.   Thereasa Solo, MD  08/21/2020, 8:47 AM

## 2020-08-21 NOTE — Progress Notes (Signed)
..  The following Medication: Steward Drone is approved for drug replacement program by [place]. The enrollment period is from 08/20/2020 to 08/20/2021.  Reason for Assistance: Self Pay/Medicare A only. ID: [id number] First DOS:08/18/2020.

## 2020-08-21 NOTE — Patient Instructions (Signed)
Dr. Denman George recommends a surgery to remove the fallopian tubes and ovaries.  This should take place approximately 1 month after your breast cancer therapy has completed.  She expects that the surgery should be able to take place through small incisions and be an outpatient operation.  In the meantime it is important to continue having 3 monthly vulva checks for your history of vulvar cancer.  You would be next due to be examined in the vulva tissues in 3 months time with Dr. Sondra Come.  We have scheduled you for follow-up with Dr. Denman George in June 2022 for vulvar inspection and to discuss and schedule your surgery.

## 2020-08-24 ENCOUNTER — Encounter: Payer: Self-pay | Admitting: *Deleted

## 2020-08-25 ENCOUNTER — Encounter: Payer: Self-pay | Admitting: *Deleted

## 2020-08-31 MED FILL — LORazepam 1 MG TABS: 1 | 30 days supply | Qty: 90 | Fill #1

## 2020-09-01 ENCOUNTER — Other Ambulatory Visit: Payer: Self-pay

## 2020-09-01 ENCOUNTER — Ambulatory Visit
Admission: RE | Admit: 2020-09-01 | Discharge: 2020-09-01 | Disposition: A | Discharge status: Home / Self Care | Payer: Self-pay | Source: Ambulatory Visit | Attending: Radiation Oncology | Admitting: Radiation Oncology

## 2020-09-01 DIAGNOSIS — Z170 Estrogen receptor positive status [ER+]: Secondary | ICD-10-CM | POA: Insufficient documentation

## 2020-09-01 DIAGNOSIS — C50212 Malignant neoplasm of upper-inner quadrant of left female breast: Secondary | ICD-10-CM | POA: Insufficient documentation

## 2020-09-07 ENCOUNTER — Encounter: Payer: Self-pay | Admitting: *Deleted

## 2020-09-07 NOTE — Progress Notes (Signed)
Patient Care Team: Just, Laurita Quint, FNP as PCP - General (Family Medicine) Mauro Kaufmann, RN as Oncology Nurse Navigator Rockwell Germany, RN as Oncology Nurse Navigator Nicholas Lose, MD as Consulting Physician (Hematology and Oncology) Alphonsa Overall, MD as Consulting Physician (General Surgery) Awanda Mink Craige Cotta, RN as Oncology Nurse Navigator (Oncology)  DIAGNOSIS:    ICD-10-CM   1. Malignant neoplasm of upper-inner quadrant of left breast in female, estrogen receptor positive ([place])  C50.212    Z17.0     SUMMARY OF ONCOLOGIC HISTORY: Oncology History  Vulvar cancer ([place])  01/29/2020 Initial Diagnosis   Vulvar cancer ([place])   03/11/2020 - 05/04/2020 Chemotherapy   The patient had dexamethasone (DECADRON) 4 MG tablet, 4 mg (100 % of original dose 4 mg), Oral, Daily, 1 of 1 cycle, Start date: 02/27/2020, End date: 05/05/2020 Dose modification: 4 mg (original dose 4 mg, Cycle 0) palonosetron (ALOXI) injection 0.25 mg, 0.25 mg, Intravenous,  Once, 1 of 1 cycle Administration: 0.25 mg (03/11/2020), 0.25 mg (03/17/2020), 0.25 mg (03/24/2020), 0.25 mg (03/31/2020), 0.25 mg (04/07/2020), 0.25 mg (04/14/2020), 0.25 mg (04/21/2020) CARBOplatin (PARAPLATIN) 300 mg in sodium chloride 0.9 % 250 mL chemo infusion, 300 mg (100 % of original dose 300 mg), Intravenous,  Once, 1 of 1 cycle Dose modification:   (original dose 300 mg, Cycle 1) Administration: 300 mg (03/11/2020), 290 mg (03/17/2020), 300 mg (03/24/2020), 230 mg (03/31/2020), 230 mg (04/07/2020), 230 mg (04/14/2020) PACLitaxel (TAXOL) 108 mg in sodium chloride 0.9 % 250 mL chemo infusion (</= 59m/m2), 50 mg/m2 = 108 mg, Intravenous,  Once, 1 of 1 cycle Dose modification: 45 mg/m2 (original dose 50 mg/m2, Cycle 1, Reason: Dose not tolerated) Administration: 108 mg (03/11/2020), 108 mg (03/17/2020), 108 mg (03/24/2020), 96 mg (03/31/2020), 96 mg (04/07/2020), 96 mg (04/14/2020) fosaprepitant (EMEND) 150 mg in sodium chloride 0.9 % 145 mL IVPB, 150 mg, Intravenous,   Once, 1 of 1 cycle Administration: 150 mg (03/24/2020), 150 mg (03/31/2020), 150 mg (04/07/2020), 150 mg (04/14/2020) trastuzumab-anns (KANJINTI) 378 mg in sodium chloride 0.9 % 250 mL chemo infusion, 4 mg/kg = 378 mg (100 % of original dose 4 mg/kg), Intravenous,  Once, 1 of 1 cycle Dose modification: 4 mg/kg (original dose 4 mg/kg, Cycle 1, Reason: Other (see comments), Comment: loading dose) Administration: 378 mg (03/11/2020), 189 mg (03/17/2020), 189 mg (03/24/2020), 189 mg (03/31/2020), 189 mg (04/07/2020), 189 mg (04/14/2020), 189 mg (04/21/2020), 189 mg (05/04/2020)  for chemotherapy treatment.    Malignant neoplasm of upper-inner quadrant of left breast in female, estrogen receptor positive ([place]  02/20/2020 Initial Diagnosis   PET scan on 02/07/20 following a diagnosis of vulvar cancer showed a 1.7cm left breast mass and mildly hypermetabolic left axillary lymph nodes. [place]ammogram and [place] showed a 3.1cm left breast mass at the 11 o'clock position, palpable on exam, and a single abnormal left axillary lymph node with cortical thickening. Left breast biopsy on 02/20/20 showed invasive and in situ carcinoma in the breast and axilla, grade 3, HER-2 positive (3+), ER/PR+ 95%, Ki67 75%   02/25/2020 Cancer Staging   Staging form: Breast, AJCC 8th Edition - Clinical stage from 02/25/2020: Stage IB (cT2, cN1, cM0, G3, ER+, PR+, HER2+) - Signed by GNicholas Lose MD on 02/25/2020   05/29/2020 - 05/29/2020 Chemotherapy   The patient had palonosetron (ALOXI) injection 0.25 mg, 0.25 mg, Intravenous,  Once, 1 of 3 cycles Administration: 0.25 mg (05/29/2020) pegfilgrastim-jmdb (FULPHILA) injection 6 mg, 6 mg, Subcutaneous,  Once, 1 of 3  cycles CARBOplatin (PARAPLATIN) 580 mg in sodium chloride 0.9 % 250 mL chemo infusion, 580 mg (100 % of original dose 580 mg), Intravenous,  Once, 1 of 3 cycles Dose modification:   (original dose 580 mg, Cycle 1) Administration: 580 mg (05/29/2020) DOCEtaxel (TAXOTERE) 120 mg in  sodium chloride 0.9 % 250 mL chemo infusion, 60 mg/m2 = 120 mg (100 % of original dose 60 mg/m2), Intravenous,  Once, 1 of 3 cycles Dose modification: 60 mg/m2 (original dose 60 mg/m2, Cycle 1, Reason: Provider Judgment), 50 mg/m2 (original dose 60 mg/m2, Cycle 2, Reason: Dose not tolerated) Administration: 120 mg (05/29/2020) fosaprepitant (EMEND) 150 mg in sodium chloride 0.9 % 145 mL IVPB, 150 mg, Intravenous,  Once, 1 of 3 cycles Administration: 150 mg (05/29/2020) pertuzumab (PERJETA) 420 mg in sodium chloride 0.9 % 250 mL chemo infusion, 420 mg (50 % of original dose 840 mg), Intravenous, Once, 1 of 3 cycles Dose modification: 420 mg (original dose 840 mg, Cycle 1, Reason: Provider Judgment) Administration: 420 mg (05/29/2020) trastuzumab-dkst (OGIVRI) 525 mg in sodium chloride 0.9 % 250 mL chemo infusion, 6 mg/kg = 525 mg (75 % of original dose 8 mg/kg), Intravenous,  Once, 1 of 3 cycles Dose modification: 6 mg/kg (original dose 8 mg/kg, Cycle 1, Reason: Provider Judgment) Administration: 525 mg (05/29/2020)  for chemotherapy treatment.    06/22/2020 Genetic Testing   Positive genetic testing: Heterozygous pathogenic variant detected in BRIP1 gene at c.2010dup (p.Glu671*).  Variant of uncertain significance detected in POLD1 at c.532G>A (p.Gly178Arg).  No other pathogenic variants detected in [facility] Common Hereditary Cancers Panel.  The report date is June 22, 2020.   The Common Hereditary Cancers Panel offered by [facility] includes sequencing and/or deletion duplication testing of the following 48 genes: APC, ATM, AXIN2, BARD1, BMPR1A, BRCA1, BRCA2, BRIP1, CDH1, CDK4, CDKN2A (p14ARF), CDKN2A (p16INK4a), CHEK2, CTNNA1, DICER1, EPCAM (Deletion/duplication testing only), GREM1 (promoter region deletion/duplication testing only), KIT, MEN1, MLH1, MSH2, MSH3, MSH6, MUTYH, NBN, NF1, NHTL1, PALB2, PDGFRA, PMS2, POLD1, POLE, PTEN, RAD50, RAD51C, RAD51D, RNF43, SDHB, SDHC, SDHD, SMAD4, SMARCA4. STK11,  TP53, TSC1, TSC2, and VHL.  The following genes were evaluated for sequence changes only: SDHA and HOXB13 c.251G>A variant only.   07/27/2020 Surgery   Left mastectomy [facility] Standing): three foci of IDC, 3.7cm, 1.5cm, and 1.2cm, with intermediate to high grade DCIS, clear margins, with 1/2 left axillary lymph nodes positive for carcinoma.    08/18/2020 -  Chemotherapy   The patient had ado-trastuzumab emtansine (KADCYLA) 300 mg in sodium chloride 0.9 % 250 mL chemo infusion, 3.6 mg/kg = 300 mg, Intravenous, Once, 1 of 12 cycles Administration: 300 mg (08/18/2020)  for chemotherapy treatment.      CHIEF COMPLIANT: Follow-up for Kadcyla maintenance  INTERVAL HISTORY: Sandra Miller is a 45 y.o. with above-mentioned history of left breast cancer and vulvarcancer who completedneoadjuvant chemotherapy, underwent a left mastectomy, and is currently on Kadcyla maintenance.She also has a history of DVT currently on Xarelto.She presents to the clinic todayfor treatment.  She tolerated cycle 1 of Kadcyla extremely well without any problems or concerns.  Did not have any nausea vomiting or fatigue.   ALLERGIES:  is allergic to dilaudid [hydromorphone hcl], depakote [divalproex sodium], minocycline, and aspirin.  MEDICATIONS:  Current Outpatient Medications  Medication Sig Dispense Refill  . acetaminophen (TYLENOL) 500 MG tablet Take 2 tablets (1,000 mg total) by mouth every 6 (six) hours as needed for moderate pain or fever. Avoid tylenol until your liver function back to normal 30  tablet 0  . diphenoxylate-atropine (LOMOTIL) 2.5-0.025 MG tablet TAKE 1 TO 2 TABLETS BY MOUTH FOUR TIMES DAILY AS NEEDED FOR DIARRHEA (Patient taking differently: Take 1-2 tablets by mouth 4 (four) times daily as needed for diarrhea or loose stools. TAKE 1 TO 2 TABLETS BY MOUTH FOUR TIMES DAILY AS NEEDED FOR DIARRHEA) 45 tablet 1  . dronabinol (MARINOL) 2.5 MG capsule Take 1 capsule (2.5 mg total) by mouth 2 (two) times  daily before a meal. 60 capsule 0  . lidocaine-prilocaine (EMLA) cream Apply to affected area once 30 g 3  . lisinopril (ZESTRIL) 20 MG tablet Take 1 tablet (20 mg total) by mouth daily. 90 tablet 3  . LORazepam (ATIVAN) 1 MG tablet Take 1 tablet (1 mg total) by mouth 3 (three) times daily as needed for anxiety. 90 tablet 2  . magnesium oxide (MAG-OX) 400 (241.3 Mg) MG tablet Take 1 tablet (400 mg total) by mouth 2 (two) times daily. 60 tablet 1  . Multiple Vitamin (MULTIVITAMIN WITH MINERALS) TABS tablet Take 1 tablet by mouth daily.    . nicotine (NICODERM CQ - DOSED IN MG/24 HOURS) 14 mg/24hr patch Place 1 patch (14 mg total) onto the skin daily. 28 patch 0  . OLANZapine (ZYPREXA) 10 MG tablet Take 1 tablet (10 mg total) by mouth at bedtime. 90 tablet 0  . omeprazole (PRILOSEC) 40 MG capsule TAKE 1 CAPSULE BY MOUTH TWO TIMES DAILY BEFORE A MEAL 180 capsule 0  . potassium chloride SA (KLOR-CON) 20 MEQ tablet Take 1 tablet (20 mEq total) by mouth daily. 60 tablet 2  . promethazine (PHENERGAN) 25 MG tablet Take 1 tablet (25 mg total) by mouth every 6 (six) hours as needed for nausea. 30 tablet 3  . rivaroxaban (XARELTO) 20 MG TABS tablet Take 1 tablet (20 mg total) by mouth daily with supper. 30 tablet 0  . rosuvastatin (CRESTOR) 10 MG tablet Take 1 tablet (10 mg total) by mouth daily. 90 tablet 3  . venlafaxine XR (EFFEXOR XR) 37.5 MG 24 hr capsule Take three capsule (112.5 mg total) by mouth daily. 90 capsule 2   No current facility-administered medications for this visit.   Facility-Administered Medications Ordered in Other Visits  Medication Dose Route Frequency Provider Last Rate Last Admin  . LORazepam (ATIVAN) injection 0.5 mg  0.5 mg Intravenous Once Harle Stanford., PA-C        PHYSICAL EXAMINATION: ECOG PERFORMANCE STATUS: 1 - Symptomatic but completely ambulatory  Vitals:   09/08/20 1226  BP: 135/83  Pulse: 86  Resp: 18  Temp: 98.2 F (36.8 C)  SpO2: 100%   Filed Weights    09/08/20 1226  Weight: 188 lb 3.2 oz (85.4 kg)    LABORATORY DATA:  I have reviewed the data as listed CMP Latest Ref Rng & Units 09/08/2020 08/18/2020 07/21/2020  Glucose 70 - 99 mg/dL 100(H) 81 108(H)  BUN 6 - 20 mg/dL $Remove'9 11 9  'YxNrliA$ Creatinine 0.44 - 1.00 mg/dL 0.76 0.74 0.74  Sodium 135 - 145 mmol/L 139 139 135  Potassium 3.5 - 5.1 mmol/L 3.8 3.5 4.0  Chloride 98 - 111 mmol/L 104 106 104  CO2 22 - 32 mmol/L 26 24 20(L)  Calcium 8.9 - 10.3 mg/dL 10.5(H) 10.1 10.0  Total Protein 6.5 - 8.1 g/dL 7.8 7.1 7.1  Total Bilirubin 0.3 - 1.2 mg/dL 0.5 0.4 0.3  Alkaline Phos 38 - 126 U/L 53 52 40  AST 15 - 41 U/L 51(H) 20 26  ALT  0 - 44 U/L 60(H) 17 24    Lab Results  Component Value Date   WBC 9.1 09/08/2020   HGB 12.0 09/08/2020   HCT 36.6 09/08/2020   MCV 91.7 09/08/2020   PLT 373 09/08/2020   NEUTROABS 7.2 09/08/2020    ASSESSMENT & PLAN:  Malignant neoplasm of upper-inner quadrant of left breast in female, estrogen receptor positive ([place]) Breast cancer and vulvar cancers  02/20/2020:PET scan on 02/07/20 following a diagnosis of vulvar cancer showed a 1.7cm left breast mass and mildly hypermetabolic left axillary lymph nodes. Mammogram and [place] on 02/18/20 showed a 3.1cm left breast mass at the 11 o'clock position, palpable on exam, and a single abnormal left axillary lymph node with cortical thickening. Left breast biopsy on 02/20/20 showed invasive and in situ carcinoma in the breast and axilla, grade 3, HER-2 positive (3+), ER/PR+ 95%, Ki67 75%  Treatment plan: 1.Neoadjuvant chemoradiation for vulvar cancer with Taxol, carboplatin, Herceptin weekly x6 cycles 2.neoadjuvant chemotherapy with TCH Perjeta x3 cycles 3.breast conserving surgery if possible with sentinel lymph node study 4. Followed by adjuvant radiation therapy if patient had lumpectomy 5.  Kadcyla maintenance 6.  Adjuvant antiestrogen therapy 7.  Adjuvant  neratinib ------------------------------------------------------------------------------------------------------------------- 07/27/20:Left mastectomy [name]): three foci of IDC, 3.7cm, 1.5cm, and 1.2cm, with intermediate to high grade DCIS, clear margins, with 1/2 left axillary lymph nodes positive for carcinoma.   Current treatment: Today is cycle 2 Kadcyla Kadcyla toxicities: Denies any adverse effects to Kadcyla. Mild elevation of AST and ALT: Will be monitored.  Echocardiogram 08/10/2020: EF 65 to 70%  Smoking cessation: I sent a prescription for NicoDerm patches  Return to clinic every 3 weeks for Kadcyla.    No orders of the defined types were placed in this encounter.  The patient has a good understanding of the overall plan. she agrees with it. she will call with any problems that may develop before the next visit here.  Total time spent: 30 mins including face to face time and time spent for planning, charting and coordination of care  Nicholas Lose, MD 09/08/2020  I, Cloyde Reams Dorshimer, am acting as scribe for Dr. Nicholas Lose.  I have reviewed the above documentation for accuracy and completeness, and I agree with the above.

## 2020-09-08 ENCOUNTER — Inpatient Hospital Stay: Payer: Self-pay

## 2020-09-08 ENCOUNTER — Other Ambulatory Visit: Payer: Self-pay

## 2020-09-08 ENCOUNTER — Ambulatory Visit
Admission: RE | Admit: 2020-09-08 | Payer: PRIVATE HEALTH INSURANCE | Source: Ambulatory Visit | Admitting: Radiation Oncology

## 2020-09-08 ENCOUNTER — Inpatient Hospital Stay: Payer: Self-pay | Attending: Gynecologic Oncology

## 2020-09-08 ENCOUNTER — Inpatient Hospital Stay (HOSPITAL_BASED_OUTPATIENT_CLINIC_OR_DEPARTMENT_OTHER): Payer: Self-pay | Admitting: Hematology and Oncology

## 2020-09-08 ENCOUNTER — Ambulatory Visit: Payer: Self-pay

## 2020-09-08 ENCOUNTER — Other Ambulatory Visit: Payer: Self-pay | Admitting: Hematology and Oncology

## 2020-09-08 DIAGNOSIS — Z79899 Other long term (current) drug therapy: Secondary | ICD-10-CM | POA: Insufficient documentation

## 2020-09-08 DIAGNOSIS — C50212 Malignant neoplasm of upper-inner quadrant of left female breast: Secondary | ICD-10-CM | POA: Diagnosis not present

## 2020-09-08 DIAGNOSIS — Z170 Estrogen receptor positive status [ER+]: Secondary | ICD-10-CM | POA: Insufficient documentation

## 2020-09-08 DIAGNOSIS — Z95828 Presence of other vascular implants and grafts: Secondary | ICD-10-CM

## 2020-09-08 DIAGNOSIS — Z5112 Encounter for antineoplastic immunotherapy: Secondary | ICD-10-CM | POA: Insufficient documentation

## 2020-09-08 DIAGNOSIS — Z23 Encounter for immunization: Secondary | ICD-10-CM | POA: Insufficient documentation

## 2020-09-08 LAB — CBC WITH DIFFERENTIAL (CANCER CENTER ONLY)
Abs Immature Granulocytes: 0.02 10*3/uL (ref 0.00–0.07)
Basophils Absolute: 0 10*3/uL (ref 0.0–0.1)
Basophils Relative: 0 %
Eosinophils Absolute: 0.1 10*3/uL (ref 0.0–0.5)
Eosinophils Relative: 1 %
HCT: 36.6 % (ref 36.0–46.0)
Hemoglobin: 12 g/dL (ref 12.0–15.0)
Immature Granulocytes: 0 %
Lymphocytes Relative: 15 %
Lymphs Abs: 1.3 10*3/uL (ref 0.7–4.0)
MCH: 30.1 pg (ref 26.0–34.0)
MCHC: 32.8 g/dL (ref 30.0–36.0)
MCV: 91.7 fL (ref 80.0–100.0)
Monocytes Absolute: 0.4 10*3/uL (ref 0.1–1.0)
Monocytes Relative: 5 %
Neutro Abs: 7.2 10*3/uL (ref 1.7–7.7)
Neutrophils Relative %: 79 %
Platelet Count: 373 10*3/uL (ref 150–400)
RBC: 3.99 MIL/uL (ref 3.87–5.11)
RDW: 14 % (ref 11.5–15.5)
WBC Count: 9.1 10*3/uL (ref 4.0–10.5)
nRBC: 0 % (ref 0.0–0.2)

## 2020-09-08 LAB — CMP (CANCER CENTER ONLY)
ALT: 60 U/L — ABNORMAL HIGH (ref 0–44)
AST: 51 U/L — ABNORMAL HIGH (ref 15–41)
Albumin: 4.1 g/dL (ref 3.5–5.0)
Alkaline Phosphatase: 53 U/L (ref 38–126)
Anion gap: 9 (ref 5–15)
BUN: 9 mg/dL (ref 6–20)
CO2: 26 mmol/L (ref 22–32)
Calcium: 10.5 mg/dL — ABNORMAL HIGH (ref 8.9–10.3)
Chloride: 104 mmol/L (ref 98–111)
Creatinine: 0.76 mg/dL (ref 0.44–1.00)
GFR, Estimated: >60 mL/min (ref >60)
Glucose, Bld: 100 mg/dL — ABNORMAL HIGH (ref 70–99)
Potassium: 3.8 mmol/L (ref 3.5–5.1)
Sodium: 139 mmol/L (ref 135–145)
Total Bilirubin: 0.5 mg/dL (ref 0.3–1.2)
Total Protein: 7.8 g/dL (ref 6.5–8.1)

## 2020-09-08 MED ORDER — SODIUM CHLORIDE 0.9 % IV SOLN
Freq: Once | INTRAVENOUS | Status: AC
  Filled 2020-09-08: qty 250

## 2020-09-08 MED ORDER — SODIUM CHLORIDE 0.9% FLUSH
10.0000 mL | INTRAVENOUS | Status: DC | PRN
  Administered 2020-09-08: 10 mL
  Filled 2020-09-08: qty 10

## 2020-09-08 MED ORDER — NICOTINE 14 MG/24HR TD PT24: 14.0000 mg | MEDICATED_PATCH | Freq: Every day | TRANSDERMAL | 0 refills | Status: DC

## 2020-09-08 MED ORDER — DIPHENHYDRAMINE HCL 25 MG PO CAPS
ORAL_CAPSULE | ORAL | Status: AC
  Filled 2020-09-08: qty 1

## 2020-09-08 MED ORDER — ACETAMINOPHEN 325 MG PO TABS
ORAL_TABLET | ORAL | Status: AC
  Filled 2020-09-08: qty 2

## 2020-09-08 MED ORDER — DIPHENHYDRAMINE HCL 25 MG PO CAPS
50.0000 mg | ORAL_CAPSULE | Freq: Once | ORAL | Status: AC
  Administered 2020-09-08: 50 mg via ORAL

## 2020-09-08 MED ORDER — SODIUM CHLORIDE 0.9 % IV SOLN
3.6000 mg/kg | Freq: Once | INTRAVENOUS | Status: AC
  Administered 2020-09-08: 300 mg via INTRAVENOUS
  Filled 2020-09-08: qty 15

## 2020-09-08 MED ORDER — HEPARIN SOD (PORK) LOCK FLUSH 100 UNIT/ML IV SOLN
500.0000 [IU] | Freq: Once | INTRAVENOUS | Status: AC | PRN
  Administered 2020-09-08: 500 [IU]
  Filled 2020-09-08: qty 5

## 2020-09-08 MED ORDER — SODIUM CHLORIDE 0.9% FLUSH
10.0000 mL | Freq: Once | INTRAVENOUS | Status: AC
  Administered 2020-09-08: 10 mL
  Filled 2020-09-08: qty 10

## 2020-09-08 MED ORDER — ACETAMINOPHEN 325 MG PO TABS
650.0000 mg | ORAL_TABLET | Freq: Once | ORAL | Status: AC
  Administered 2020-09-08: 650 mg via ORAL

## 2020-09-08 MED FILL — NICOTINE 14 MG/24HR PATCH: 14 | 28 days supply | Qty: 28 | Fill #0

## 2020-09-08 NOTE — Patient Instructions (Signed)
[facility] Discharge Instructions for Patients Receiving Chemotherapy  Today you received the following chemotherapy agent: Kadcyla  To help prevent nausea and vomiting after your treatment, we encourage you to take your nausea medication as prescribed.   If you develop nausea and vomiting that is not controlled by your nausea medication, call the clinic.   BELOW ARE SYMPTOMS THAT SHOULD BE REPORTED IMMEDIATELY:  *FEVER GREATER THAN 100.5 F  *CHILLS WITH OR WITHOUT FEVER  NAUSEA AND VOMITING THAT IS NOT CONTROLLED WITH YOUR NAUSEA MEDICATION  *UNUSUAL SHORTNESS OF BREATH  *UNUSUAL BRUISING OR BLEEDING  TENDERNESS IN MOUTH AND THROAT WITH OR WITHOUT PRESENCE OF ULCERS  *URINARY PROBLEMS  *BOWEL PROBLEMS  UNUSUAL RASH Items with * indicate a potential emergency and should be followed up as soon as possible.  Feel free to call the clinic should you have any questions or concerns. The clinic phone number is [phone].  Please show the CHEMO ALERT CARD at check-in to the Emergency Department and triage nurse.     Ado-Trastuzumab Emtansine for injection What is this medicine? ADO-TRASTUZUMAB EMTANSINE (ADD oh traz TOO zuh mab em TAN zine) is a monoclonal antibody combined with chemotherapy. It is used to treat breast cancer. This medicine may be used for other purposes; ask your health care provider or pharmacist if you have questions. COMMON BRAND NAME(S): Kadcyla What should I tell my health care provider before I take this medicine? They need to know if you have any of these conditions:  heart disease  heart failure  infection (especially a virus infection such as chickenpox, cold sores, or herpes)  liver disease  lung or breathing disease, like asthma  tingling of the fingers or toes, or other nerve disorder  an unusual or allergic reaction to ado-trastuzumab emtansine, other medications, foods, dyes, or preservatives  pregnant or trying to  get pregnant  breast-feeding How should I use this medicine? This medicine is for infusion into a vein. It is given by a health care professional in a hospital or clinic setting. Talk to your pediatrician regarding the use of this medicine in children. Special care may be needed. Overdosage: If you think you have taken too much of this medicine contact a poison control center or emergency room at once. NOTE: This medicine is only for you. Do not share this medicine with others. What if I miss a dose? It is important not to miss your dose. Call your doctor or health care professional if you are unable to keep an appointment. What may interact with this medicine? This medicine may also interact with the following medications:  atazanavir  boceprevir  clarithromycin  delavirdine  indinavir  dalfopristin; quinupristin  isoniazid, INH  itraconazole  ketoconazole  nefazodone  nelfinavir  ritonavir  telaprevir  telithromycin  tipranavir  voriconazole This list may not describe all possible interactions. Give your health care provider a list of all the medicines, herbs, non-prescription drugs, or dietary supplements you use. Also tell them if you smoke, drink alcohol, or use illegal drugs. Some items may interact with your medicine. What should I watch for while using this medicine? Visit your doctor for checks on your progress. This drug may make you feel generally unwell. This is not uncommon, as chemotherapy can affect healthy cells as well as cancer cells. Report any side effects. Continue your course of treatment even though you feel ill unless your doctor tells you to stop. You may need blood work done while you are   taking this medicine. Call your doctor or health care professional for advice if you get a fever, chills or sore throat, or other symptoms of a cold or flu. Do not treat yourself. This drug decreases your body's ability to fight infections. Try to avoid being  around people who are sick. Be careful brushing and flossing your teeth or using a toothpick because you may get an infection or bleed more easily. If you have any dental work done, tell your dentist you are receiving this medicine. Avoid taking products that contain aspirin, acetaminophen, ibuprofen, naproxen, or ketoprofen unless instructed by your doctor. These medicines may hide a fever. Do not become pregnant while taking this medicine or for 7 months after stopping it, men with female partners should use contraception during treatment and for 4 months after the last dose. Women should inform their doctor if they wish to become pregnant or think they might be pregnant. There is a potential for serious side effects to an unborn child. Do not breast-feed an infant while taking this medicine or for 7 months after the last dose. Men who have a partner who is pregnant or who is capable of becoming pregnant should use a condom during sexual activity while taking this medicine and for 4 months after stopping it. Men should inform their doctors if they wish to father a child. This medicine may lower sperm counts. Talk to your health care professional or pharmacist for more information. What side effects may I notice from receiving this medicine? Side effects that you should report to your doctor or health care professional as soon as possible:  allergic reactions like skin rash, itching or hives, swelling of the face, lips, or tongue  breathing problems  chest pain or palpitations  fever or chills, sore throat  general ill feeling or flu-like symptoms  light-colored stools  nausea, vomiting  pain, tingling, numbness in the hands or feet  signs and symptoms of bleeding such as bloody or black, tarry stools; red or dark-brown urine; spitting up blood or brown material that looks like coffee grounds; red spots on the skin; unusual bruising or bleeding from the eye, gums, or nose  swelling of the  legs or ankles  yellowing of the eyes or skin Side effects that usually do not require medical attention (report to your doctor or health care professional if they continue or are bothersome):  changes in taste  constipation  dizziness  headache  joint pain  muscle pain  trouble sleeping  unusually weak or tired This list may not describe all possible side effects. Call your doctor for medical advice about side effects. You may report side effects to FDA at 1-800-FDA-1088. Where should I keep my medicine? This drug is given in a hospital or clinic and will not be stored at home. NOTE: This sheet is a summary. It may not cover all possible information. If you have questions about this medicine, talk to your doctor, pharmacist, or health care provider.  2020 Elsevier/Gold Standard (2018-01-19 10:03:15)   

## 2020-09-08 NOTE — Assessment & Plan Note (Signed)
Breast cancer and vulvar cancers  02/20/2020:PET scan on 02/07/20 following a diagnosis of vulvar cancer showed a 1.7cm left breast mass and mildly hypermetabolic left axillary lymph nodes. Mammogram and [place] on 02/18/20 showed a 3.1cm left breast mass at the 11 o'clock position, palpable on exam, and a single abnormal left axillary lymph node with cortical thickening. Left breast biopsy on 02/20/20 showed invasive and in situ carcinoma in the breast and axilla, grade 3, HER-2 positive (3+), ER/PR+ 95%, Ki67 75%  Treatment plan: 1.Neoadjuvant chemoradiation for vulvar cancer with Taxol, carboplatin, Herceptin weekly x6 cycles 2.neoadjuvant chemotherapy with TCH Perjeta x3 cycles 3.breast conserving surgery if possible with sentinel lymph node study 4. Followed by adjuvant radiation therapy if patient had lumpectomy 5.  Kadcyla maintenance 6.  Adjuvant antiestrogen therapy 7.  Adjuvant neratinib ------------------------------------------------------------------------------------------------------------------- 07/27/20:Left mastectomy [name]): three foci of IDC, 3.7cm, 1.5cm, and 1.2cm, with intermediate to high grade DCIS, clear margins, with 1/2 left axillary lymph nodes positive for carcinoma.   Current treatment: Today is cycle 2 Kadcyla Kadcyla toxicities: Echocardiogram 08/10/2020: EF 65 to 70%  Return to clinic every 3 weeks for Kadcyla.

## 2020-09-09 ENCOUNTER — Ambulatory Visit: Payer: PRIVATE HEALTH INSURANCE

## 2020-09-09 ENCOUNTER — Ambulatory Visit
Admission: RE | Admit: 2020-09-09 | Discharge: 2020-09-09 | Disposition: A | Discharge status: Home / Self Care | Payer: PRIVATE HEALTH INSURANCE | Source: Ambulatory Visit | Attending: Radiation Oncology | Admitting: Radiation Oncology

## 2020-09-09 DIAGNOSIS — C50212 Malignant neoplasm of upper-inner quadrant of left female breast: Secondary | ICD-10-CM | POA: Diagnosis not present

## 2020-09-10 ENCOUNTER — Telehealth: Payer: Self-pay | Admitting: Hematology and Oncology

## 2020-09-10 ENCOUNTER — Other Ambulatory Visit: Payer: Self-pay

## 2020-09-10 ENCOUNTER — Ambulatory Visit: Payer: PRIVATE HEALTH INSURANCE

## 2020-09-10 ENCOUNTER — Ambulatory Visit
Admission: RE | Admit: 2020-09-10 | Discharge: 2020-09-10 | Disposition: A | Discharge status: Home / Self Care | Payer: PRIVATE HEALTH INSURANCE | Source: Ambulatory Visit | Attending: Radiation Oncology | Admitting: Radiation Oncology

## 2020-09-10 DIAGNOSIS — C50212 Malignant neoplasm of upper-inner quadrant of left female breast: Secondary | ICD-10-CM | POA: Diagnosis not present

## 2020-09-10 NOTE — Telephone Encounter (Signed)
Scheduled per 1/4 los. Pt will receive an updated appt calendar at next visit per appt notes

## 2020-09-11 ENCOUNTER — Ambulatory Visit: Payer: PRIVATE HEALTH INSURANCE

## 2020-09-11 ENCOUNTER — Ambulatory Visit
Admission: RE | Admit: 2020-09-11 | Discharge: 2020-09-11 | Disposition: A | Discharge status: Home / Self Care | Payer: PRIVATE HEALTH INSURANCE | Source: Ambulatory Visit | Attending: Radiation Oncology | Admitting: Radiation Oncology

## 2020-09-11 DIAGNOSIS — C50212 Malignant neoplasm of upper-inner quadrant of left female breast: Secondary | ICD-10-CM | POA: Diagnosis not present

## 2020-09-14 ENCOUNTER — Ambulatory Visit
Admission: RE | Admit: 2020-09-14 | Discharge: 2020-09-14 | Disposition: A | Discharge status: Home / Self Care | Payer: PRIVATE HEALTH INSURANCE | Source: Ambulatory Visit | Attending: Radiation Oncology | Admitting: Radiation Oncology

## 2020-09-14 ENCOUNTER — Other Ambulatory Visit: Payer: Self-pay

## 2020-09-14 ENCOUNTER — Ambulatory Visit: Payer: PRIVATE HEALTH INSURANCE

## 2020-09-14 DIAGNOSIS — C50212 Malignant neoplasm of upper-inner quadrant of left female breast: Secondary | ICD-10-CM | POA: Diagnosis not present

## 2020-09-14 MED FILL — VENLAFAXINE HCL ER 37.5 MG: 37.5 | 30 days supply | Qty: 90 | Fill #2

## 2020-09-15 ENCOUNTER — Ambulatory Visit
Admission: RE | Admit: 2020-09-15 | Discharge: 2020-09-15 | Disposition: A | Discharge status: Home / Self Care | Payer: PRIVATE HEALTH INSURANCE | Source: Ambulatory Visit | Attending: Radiation Oncology | Admitting: Radiation Oncology

## 2020-09-15 ENCOUNTER — Ambulatory Visit: Payer: PRIVATE HEALTH INSURANCE

## 2020-09-15 DIAGNOSIS — C50212 Malignant neoplasm of upper-inner quadrant of left female breast: Secondary | ICD-10-CM | POA: Diagnosis not present

## 2020-09-16 ENCOUNTER — Ambulatory Visit: Payer: PRIVATE HEALTH INSURANCE

## 2020-09-16 ENCOUNTER — Other Ambulatory Visit: Payer: Self-pay

## 2020-09-16 ENCOUNTER — Inpatient Hospital Stay: Payer: Self-pay

## 2020-09-16 ENCOUNTER — Ambulatory Visit
Admission: RE | Admit: 2020-09-16 | Discharge: 2020-09-16 | Disposition: A | Discharge status: Home / Self Care | Payer: PRIVATE HEALTH INSURANCE | Source: Ambulatory Visit | Attending: Radiation Oncology | Admitting: Radiation Oncology

## 2020-09-16 DIAGNOSIS — C50212 Malignant neoplasm of upper-inner quadrant of left female breast: Secondary | ICD-10-CM | POA: Diagnosis not present

## 2020-09-16 DIAGNOSIS — Z23 Encounter for immunization: Secondary | ICD-10-CM

## 2020-09-16 NOTE — Progress Notes (Signed)
Pt remained for 15 min post injection, tolerated well.

## 2020-09-17 ENCOUNTER — Ambulatory Visit
Admission: RE | Admit: 2020-09-17 | Discharge: 2020-09-17 | Disposition: A | Discharge status: Home / Self Care | Payer: PRIVATE HEALTH INSURANCE | Source: Ambulatory Visit | Attending: Radiation Oncology | Admitting: Radiation Oncology

## 2020-09-17 ENCOUNTER — Ambulatory Visit: Payer: PRIVATE HEALTH INSURANCE

## 2020-09-17 DIAGNOSIS — C50212 Malignant neoplasm of upper-inner quadrant of left female breast: Secondary | ICD-10-CM | POA: Diagnosis not present

## 2020-09-18 ENCOUNTER — Ambulatory Visit
Admission: RE | Admit: 2020-09-18 | Discharge: 2020-09-18 | Disposition: A | Discharge status: Home / Self Care | Payer: PRIVATE HEALTH INSURANCE | Source: Ambulatory Visit | Attending: Radiation Oncology | Admitting: Radiation Oncology

## 2020-09-18 ENCOUNTER — Ambulatory Visit: Payer: PRIVATE HEALTH INSURANCE

## 2020-09-18 DIAGNOSIS — C50212 Malignant neoplasm of upper-inner quadrant of left female breast: Secondary | ICD-10-CM | POA: Diagnosis not present

## 2020-09-21 ENCOUNTER — Ambulatory Visit: Payer: PRIVATE HEALTH INSURANCE

## 2020-09-22 ENCOUNTER — Other Ambulatory Visit: Payer: Self-pay

## 2020-09-22 ENCOUNTER — Encounter (HOSPITAL_COMMUNITY): Payer: Self-pay | Admitting: Psychiatry

## 2020-09-22 ENCOUNTER — Ambulatory Visit: Payer: PRIVATE HEALTH INSURANCE

## 2020-09-22 ENCOUNTER — Other Ambulatory Visit (HOSPITAL_COMMUNITY): Payer: Self-pay | Admitting: Psychiatry

## 2020-09-22 ENCOUNTER — Telehealth (INDEPENDENT_AMBULATORY_CARE_PROVIDER_SITE_OTHER): Payer: Self-pay | Admitting: Psychiatry

## 2020-09-22 DIAGNOSIS — F319 Bipolar disorder, unspecified: Secondary | ICD-10-CM

## 2020-09-22 DIAGNOSIS — F411 Generalized anxiety disorder: Secondary | ICD-10-CM

## 2020-09-22 MED ORDER — VENLAFAXINE HCL ER 37.5 MG PO CP24: ORAL_CAPSULE | ORAL | 2 refills | Status: DC

## 2020-09-22 MED ORDER — LORAZEPAM 1 MG PO TABS: 1.0000 mg | ORAL_TABLET | Freq: Three times a day (TID) | ORAL | 2 refills | Status: DC | PRN

## 2020-09-22 MED ORDER — OLANZAPINE 10 MG PO TABS: 10.0000 mg | ORAL_TABLET | Freq: Every day | ORAL | 0 refills | Status: DC

## 2020-09-22 MED FILL — XARELTO 20 MG TABLET: 20 | 30 days supply | Qty: 30 | Fill #4

## 2020-09-22 MED FILL — OLANZapine 10 MG TABS: 10 | 90 days supply | Qty: 90 | Fill #0

## 2020-09-22 NOTE — Progress Notes (Signed)
Virtual Visit via Telephone Note  I connected with [place] on 09/22/20 at  9:00 AM EST by telephone and verified that I am speaking with the correct person using two identifiers.  Location: Patient: Home Provider: Home Office   I discussed the limitations, risks, security and privacy concerns of performing an evaluation and management service by telephone and the availability of in person appointments. I also discussed with the patient that there may be a patient responsible charge related to this service. The patient expressed understanding and agreed to proceed.   History of Present Illness: Patient is evaluated by phone session.  She is on the phone by herself.  Patient has left mastectomy in November and now she is getting chemotherapy and radiation.  Patient told that lately she has been very tired and fatigued.  On the last visit we have discontinued trazodone, Topamax and reduce her venlafaxine and olanzapine.  She denies any paranoia, hallucination, severe anger but admitted tired and fatigued.  She is going through a lot and her biggest concern is financial issues.  She is not working since June and she had applied for long-term disability.  Her short-term disability is stopped in September.  Currently she has no insurance and she was told to get COBRA but it is very expensive.  Her family members are helping her.  Her mother who is staying with her is paying her bills.  She is hoping to go back to work in August but if she feels better she may go earlier.  She is currently an nonworking employee at [employer].  She started walking the dog that helps some anxiety but she is nervous about her health and future.  She denies any mania, impulsive behavior or any agitation.  However she do get frustrated when she cannot do anything and stays to herself all day.  She wants to get better and she is more hopeful after chemotherapy and radiation.  Her appetite is better.  She has no tremors,  shakes or any EPS.  She denies any suicidal thoughts.  She had a good Christmas as her children cook for her.  Past Psychiatric History: H/Obipolar disorder, alcohol dependence and marijuana abuse. H/Oat least 6inpatient.Triedlithium, Seroquel, Lamictal, Paxil, Celexa, Abilify, Geodon,Klonopin,Depakote, Neurontin and Cymbalta. No H/ohistory of suicidal attempt.     Psychiatric Specialty Exam: Physical Exam  Review of Systems  Constitutional: Positive for fatigue.    Weight 188 lb (85.3 kg).There is no height or weight on file to calculate BMI.  General Appearance: NA  Eye Contact:  NA  Speech:  Slow  Volume:  Decreased  Mood:  Dysphoric, tired anxious  Affect:  NA  Thought Process:  Goal Directed  Orientation:  Full (Time, Place, and Person)  Thought Content:  Rumination  Suicidal Thoughts:  No  Homicidal Thoughts:  No  Memory:  Immediate;   Good Recent;   Good Remote;   Good  Judgement:  Intact  Insight:  Present  Psychomotor Activity:  NA  Concentration:  Concentration: Fair and Attention Span: Fair  Recall:  Good  Fund of Knowledge:  Good  Language:  Good  Akathisia:  No  Handed:  Right  AIMS (if indicated):     Assets:  Communication Skills Desire for Improvement Housing Resilience Social Support  ADL's:  Intact  Cognition:  WNL  Sleep:   fair      Assessment and Plan: Bipolar disorder type I.  Generalized anxiety disorder.  I reviewed blood work results.  Her liver function test better than before but is still marginally high.  We had talked about therapy in the past but she was reluctant but now she is more open to consider.  We talked about situational anxiety due to her health condition.  She is hoping to go back to work before August if her doctor cleared her.  We discussed not to change the medication at this time because her anxiety and depression is due to her concern about her general health.  I will continue venlafaxine 112.5 mg daily,  olanzapine 10 mg at bedtime and Ativan 1 mg 3 times a day.  We will provide names of the therapist and she agreed to consider it.  Discussed medication side effects and benefits.  Recommended to call us back if she is any question or any concern.  Follow-up in 3 months.  Follow Up Instructions:    I discussed the assessment and treatment plan with the patient. The patient was provided an opportunity to ask questions and all were answered. The patient agreed with the plan and demonstrated an understanding of the instructions.   The patient was advised to call back or seek an in-person evaluation if the symptoms worsen or if the condition fails to improve as anticipated.  I provided 18 minutes of non-face-to-face time during this encounter.   Kathlee Nations, MD

## 2020-09-23 ENCOUNTER — Ambulatory Visit
Admission: RE | Admit: 2020-09-23 | Discharge: 2020-09-23 | Disposition: A | Discharge status: Home / Self Care | Payer: PRIVATE HEALTH INSURANCE | Source: Ambulatory Visit | Attending: Radiation Oncology | Admitting: Radiation Oncology

## 2020-09-23 ENCOUNTER — Ambulatory Visit: Payer: PRIVATE HEALTH INSURANCE

## 2020-09-23 DIAGNOSIS — C50212 Malignant neoplasm of upper-inner quadrant of left female breast: Secondary | ICD-10-CM | POA: Diagnosis not present

## 2020-09-24 ENCOUNTER — Ambulatory Visit: Payer: PRIVATE HEALTH INSURANCE

## 2020-09-24 ENCOUNTER — Ambulatory Visit
Admission: RE | Admit: 2020-09-24 | Discharge: 2020-09-24 | Disposition: A | Discharge status: Home / Self Care | Payer: PRIVATE HEALTH INSURANCE | Source: Ambulatory Visit | Attending: Radiation Oncology | Admitting: Radiation Oncology

## 2020-09-24 DIAGNOSIS — C50212 Malignant neoplasm of upper-inner quadrant of left female breast: Secondary | ICD-10-CM | POA: Diagnosis not present

## 2020-09-25 ENCOUNTER — Ambulatory Visit: Payer: PRIVATE HEALTH INSURANCE

## 2020-09-28 ENCOUNTER — Ambulatory Visit
Admission: RE | Admit: 2020-09-28 | Discharge: 2020-09-28 | Disposition: A | Discharge status: Home / Self Care | Payer: PRIVATE HEALTH INSURANCE | Source: Ambulatory Visit | Attending: Radiation Oncology | Admitting: Radiation Oncology

## 2020-09-28 ENCOUNTER — Ambulatory Visit: Payer: PRIVATE HEALTH INSURANCE

## 2020-09-28 DIAGNOSIS — C50212 Malignant neoplasm of upper-inner quadrant of left female breast: Secondary | ICD-10-CM | POA: Diagnosis not present

## 2020-09-28 NOTE — Progress Notes (Signed)
Patient Care Team: Just, Laurita Quint, FNP as PCP - General (Family Medicine) Mauro Kaufmann, RN as Oncology Nurse Navigator Rockwell Germany, RN as Oncology Nurse Navigator Nicholas Lose, MD as Consulting Physician (Hematology and Oncology) Alphonsa Overall, MD as Consulting Physician (General Surgery) Awanda Mink Craige Cotta, RN as Oncology Nurse Navigator (Oncology)  DIAGNOSIS:    ICD-10-CM   1. Hypercalcemia  E83.52 PTH, intact and calcium  2. Malignant neoplasm of upper-inner quadrant of left breast in female, estrogen receptor positive ([place])  C50.212    Z17.0     SUMMARY OF ONCOLOGIC HISTORY: Oncology History  Vulvar cancer ([employer])  01/29/2020 Initial Diagnosis   Vulvar cancer ([place])   03/11/2020 - 05/04/2020 Chemotherapy   The patient had dexamethasone (DECADRON) 4 MG tablet, 4 mg (100 % of original dose 4 mg), Oral, Daily, 1 of 1 cycle, Start date: 02/27/2020, End date: 05/05/2020 Dose modification: 4 mg (original dose 4 mg, Cycle 0) palonosetron (ALOXI) injection 0.25 mg, 0.25 mg, Intravenous,  Once, 1 of 1 cycle Administration: 0.25 mg (03/11/2020), 0.25 mg (03/17/2020), 0.25 mg (03/24/2020), 0.25 mg (03/31/2020), 0.25 mg (04/07/2020), 0.25 mg (04/14/2020), 0.25 mg (04/21/2020) CARBOplatin (PARAPLATIN) 300 mg in sodium chloride 0.9 % 250 mL chemo infusion, 300 mg (100 % of original dose 300 mg), Intravenous,  Once, 1 of 1 cycle Dose modification:   (original dose 300 mg, Cycle 1) Administration: 300 mg (03/11/2020), 290 mg (03/17/2020), 300 mg (03/24/2020), 230 mg (03/31/2020), 230 mg (04/07/2020), 230 mg (04/14/2020) PACLitaxel (TAXOL) 108 mg in sodium chloride 0.9 % 250 mL chemo infusion (</= 56m/m2), 50 mg/m2 = 108 mg, Intravenous,  Once, 1 of 1 cycle Dose modification: 45 mg/m2 (original dose 50 mg/m2, Cycle 1, Reason: Dose not tolerated) Administration: 108 mg (03/11/2020), 108 mg (03/17/2020), 108 mg (03/24/2020), 96 mg (03/31/2020), 96 mg (04/07/2020), 96 mg (04/14/2020) fosaprepitant (EMEND) 150 mg in sodium  chloride 0.9 % 145 mL IVPB, 150 mg, Intravenous,  Once, 1 of 1 cycle Administration: 150 mg (03/24/2020), 150 mg (03/31/2020), 150 mg (04/07/2020), 150 mg (04/14/2020) trastuzumab-anns (KANJINTI) 378 mg in sodium chloride 0.9 % 250 mL chemo infusion, 4 mg/kg = 378 mg (100 % of original dose 4 mg/kg), Intravenous,  Once, 1 of 1 cycle Dose modification: 4 mg/kg (original dose 4 mg/kg, Cycle 1, Reason: Other (see comments), Comment: loading dose) Administration: 378 mg (03/11/2020), 189 mg (03/17/2020), 189 mg (03/24/2020), 189 mg (03/31/2020), 189 mg (04/07/2020), 189 mg (04/14/2020), 189 mg (04/21/2020), 189 mg (05/04/2020)  for chemotherapy treatment.    Malignant ne[place] of upper-inner quadrant of left breast in [place]  02/[place] Initial Diagnosis   PET scan on 02/07/20 following a diagnosis of vulvar cancer showed a 1.7cm left breast mass and mildly hypermetabolic left axillary lymph nodes. [place] showed a 3.1cm left breast mass at the 11 o'clock position, palpable on exam, and a single abnormal left axillary lymph node with cortical thickening. Left breast biopsy on 02/20/20 showed invasive and in situ carcinoma in the breast and axilla, grade 3, HER-2 positive (3+), ER/PR+ 95%, Ki67 75%   02/25/2020 Cancer Staging   Staging form: Breast, AJCC 8th Edition - Clinical stage from 02/25/2020: Stage IB (cT2, cN1, cM0, G3, ER+, PR+, HER2+) - Signed by GNicholas Lose MD on 02/25/2020   05/29/2020 - 05/29/2020 Chemotherapy   The patient had palonosetron (ALOXI) injection 0.25 mg, 0.25 mg, Intravenous,  Once, 1 of 3 cycles Administration: 0.25 mg (05/29/2020) pegfilgrastim-jmdb (FULPHILA) injection 6  mg, 6 mg, Subcutaneous,  Once, 1 of 3 cycles CARBOplatin (PARAPLATIN) 580 mg in sodium chloride 0.9 % 250 mL chemo infusion, 580 mg (100 % of original dose 580 mg), Intravenous,  Once, 1 of 3 cycles Dose modification:   (original dose 580 mg, Cycle 1) Administration: 580 mg  (05/29/2020) DOCEtaxel (TAXOTERE) 120 mg in sodium chloride 0.9 % 250 mL chemo infusion, 60 mg/m2 = 120 mg (100 % of original dose 60 mg/m2), Intravenous,  Once, 1 of 3 cycles Dose modification: 60 mg/m2 (original dose 60 mg/m2, Cycle 1, Reason: Provider Judgment), 50 mg/m2 (original dose 60 mg/m2, Cycle 2, Reason: Dose not tolerated) Administration: 120 mg (05/29/2020) fosaprepitant (EMEND) 150 mg in sodium chloride 0.9 % 145 mL IVPB, 150 mg, Intravenous,  Once, 1 of 3 cycles Administration: 150 mg (05/29/2020) pertuzumab (PERJETA) 420 mg in sodium chloride 0.9 % 250 mL chemo infusion, 420 mg (50 % of original dose 840 mg), Intravenous, Once, 1 of 3 cycles Dose modification: 420 mg (original dose 840 mg, Cycle 1, Reason: Provider Judgment) Administration: 420 mg (05/29/2020) trastuzumab-dkst (OGIVRI) 525 mg in sodium chloride 0.9 % 250 mL chemo infusion, 6 mg/kg = 525 mg (75 % of original dose 8 mg/kg), Intravenous,  Once, 1 of 3 cycles Dose modification: 6 mg/kg (original dose 8 mg/kg, Cycle 1, Reason: Provider Judgment) Administration: 525 mg (05/29/2020)  for chemotherapy treatment.    06/22/2020 Genetic Testing   Positive genetic testing: Heterozygous pathogenic variant detected in BRIP1 gene at c.2010dup (p.Glu671*).  Variant of uncertain significance detected in POLD1 at c.532G>A (p.Gly178Arg).  No other pathogenic variants detected in [facility] Common Hereditary Cancers Panel.  The report date is June 22, 2020.   The Common Hereditary Cancers Panel offered by [facility] includes sequencing and/or deletion duplication testing of the following 48 genes: APC, ATM, AXIN2, BARD1, BMPR1A, BRCA1, BRCA2, BRIP1, CDH1, CDK4, CDKN2A (p14ARF), CDKN2A (p16INK4a), CHEK2, CTNNA1, DICER1, EPCAM (Deletion/duplication testing only), GREM1 (promoter region deletion/duplication testing only), KIT, MEN1, MLH1, MSH2, MSH3, MSH6, MUTYH, NBN, NF1, NHTL1, PALB2, PDGFRA, PMS2, POLD1, POLE, PTEN, RAD50, RAD51C, RAD51D,  RNF43, SDHB, SDHC, SDHD, SMAD4, SMARCA4. STK11, TP53, TSC1, TSC2, and VHL.  The following genes were evaluated for sequence changes only: SDHA and HOXB13 c.251G>A variant only.   07/27/2020 Surgery   Left mastectomy [name]): three foci of IDC, 3.7cm, 1.5cm, and 1.2cm, with intermediate to high grade DCIS, clear margins, with 1/2 left axillary lymph nodes positive for carcinoma.    08/18/2020 -  Chemotherapy    Patient is on Treatment Plan: BREAST ADO-TRASTUZUMAB EMTANSINE (KADCYLA) Q21D        CHIEF COMPLIANT: Follow-upfor Kadcyla maintenance  INTERVAL HISTORY: Sandra Miller is a 45 y.o. with above-mentioned history of left breast cancer and vulvarcancerwho completedneoadjuvant chemotherapy,underwent a left mastectomy, and is currently on Kadcyla maintenance.She also has a history of DVT currently on Xarelto.She presents to the clinic todayfortreatment.  She is complaining of slight pelvic pressure.  She is currently on radiation and is tolerating it very well.  She has no major side effects to Kadcyla.  ALLERGIES:  is allergic to dilaudid [hydromorphone hcl], depakote [divalproex sodium], minocycline, and aspirin.  MEDICATIONS:  Current Outpatient Medications  Medication Sig Dispense Refill   acetaminophen (TYLENOL) 500 MG tablet Take 2 tablets (1,000 mg total) by mouth every 6 (six) hours as needed for moderate pain or fever. Avoid tylenol until your liver function back to normal 30 tablet 0   diphenoxylate-atropine (LOMOTIL) 2.5-0.025 MG tablet TAKE 1 TO 2  TABLETS BY MOUTH FOUR TIMES DAILY AS NEEDED FOR DIARRHEA (Patient taking differently: Take 1-2 tablets by mouth 4 (four) times daily as needed for diarrhea or loose stools. TAKE 1 TO 2 TABLETS BY MOUTH FOUR TIMES DAILY AS NEEDED FOR DIARRHEA) 45 tablet 1   dronabinol (MARINOL) 2.5 MG capsule Take 1 capsule (2.5 mg total) by mouth 2 (two) times daily before a meal. 60 capsule 0   lidocaine-prilocaine (EMLA) cream Apply to  affected area once 30 g 3   lisinopril (ZESTRIL) 20 MG tablet Take 1 tablet (20 mg total) by mouth daily. 90 tablet 3   LORazepam (ATIVAN) 1 MG tablet Take 1 tablet (1 mg total) by mouth 3 (three) times daily as needed for anxiety. 90 tablet 2   magnesium oxide (MAG-OX) 400 (241.3 Mg) MG tablet Take 1 tablet (400 mg total) by mouth 2 (two) times daily. 60 tablet 1   Multiple Vitamin (MULTIVITAMIN WITH MINERALS) TABS tablet Take 1 tablet by mouth daily.     nicotine (NICODERM CQ - DOSED IN MG/24 HOURS) 14 mg/24hr patch Place 1 patch (14 mg total) onto the skin daily. 28 patch 0   OLANZapine (ZYPREXA) 10 MG tablet Take 1 tablet (10 mg total) by mouth at bedtime. 90 tablet 0   omeprazole (PRILOSEC) 40 MG capsule TAKE 1 CAPSULE BY MOUTH TWO TIMES DAILY BEFORE A MEAL 180 capsule 0   potassium chloride SA (KLOR-CON) 20 MEQ tablet Take 1 tablet (20 mEq total) by mouth daily. 60 tablet 2   promethazine (PHENERGAN) 25 MG tablet Take 1 tablet (25 mg total) by mouth every 6 (six) hours as needed for nausea. 30 tablet 3   rivaroxaban (XARELTO) 20 MG TABS tablet Take 1 tablet (20 mg total) by mouth daily with supper. 30 tablet 0   rosuvastatin (CRESTOR) 10 MG tablet Take 1 tablet (10 mg total) by mouth daily. 90 tablet 3   venlafaxine XR (EFFEXOR XR) 37.5 MG 24 hr capsule Take three capsule (112.5 mg total) by mouth daily. 90 capsule 2   No current facility-administered medications for this visit.   Facility-Administered Medications Ordered in Other Visits  Medication Dose Route Frequency Provider Last Rate Last Admin   LORazepam (ATIVAN) injection 0.5 mg  0.5 mg Intravenous Once Harle Stanford., PA-C        PHYSICAL EXAMINATION: ECOG PERFORMANCE STATUS: 1 - Symptomatic but completely ambulatory  Vitals:   09/29/20 1052  BP: 117/75  Pulse: 100  Resp: 18  Temp: 97.9 F (36.6 C)  SpO2: 100%   Filed Weights   09/29/20 1052  Weight: 189 lb 14.4 oz (86.1 kg)    LABORATORY DATA:  I  have reviewed the data as listed CMP Latest Ref Rng & Units 09/29/2020 09/08/2020 08/18/2020  Glucose 70 - 99 mg/dL 85 100(H) 81  BUN 6 - 20 mg/dL 10 9 11   Creatinine 0.44 - 1.00 mg/dL 0.79 0.76 0.74  Sodium 135 - 145 mmol/L 140 139 139  Potassium 3.5 - 5.1 mmol/L 3.8 3.8 3.5  Chloride 98 - 111 mmol/L 105 104 106  CO2 22 - 32 mmol/L 26 26 24   Calcium 8.9 - 10.3 mg/dL 10.4(H) 10.5(H) 10.1  Total Protein 6.5 - 8.1 g/dL 7.9 7.8 7.1  Total Bilirubin 0.3 - 1.2 mg/dL 0.4 0.5 0.4  Alkaline Phos 38 - 126 U/L 55 53 52  AST 15 - 41 U/L 41 51(H) 20  ALT 0 - 44 U/L 46(H) 60(H) 17    Lab Results  Component Value Date   WBC 5.5 09/29/2020   HGB 12.4 09/29/2020   HCT 37.4 09/29/2020   MCV 90.8 09/29/2020   PLT 329 09/29/2020   NEUTROABS 3.6 09/29/2020    ASSESSMENT & PLAN:  Malignant neoplasm of upper-inner quadrant of left breast in female, estrogen receptor positive ([place]) Breast cancer and vulvar cancers  02/20/2020:PET scan on 02/07/20 following a diagnosis of vulvar cancer showed a 1.7cm left breast mass and mildly hypermetabolic left axillary lymph nodes. Mammogram and [place] on 02/18/20 showed a 3.1cm left breast mass at the 11 o'clock position, palpable on exam, and a single abnormal left axillary lymph node with cortical thickening. Left breast biopsy on 02/20/20 showed invasive and in situ carcinoma in the breast and axilla, grade 3, HER-2 positive (3+), ER/PR+ 95%, Ki67 75%  Treatment plan: 1.Neoadjuvant chemoradiation for vulvar cancer with Taxol, carboplatin, Herceptin weekly x6 cycles 2.neoadjuvant chemotherapy with TCH Perjeta x3 cycles 3.breast conserving surgery if possible with sentinel lymph node study 4. Followed by adjuvant radiation therapy if patient had lumpectomy 5.Kadcyla maintenance 6.Adjuvant antiestrogen therapy 7.Adjuvant neratinib ------------------------------------------------------------------------------------------------------------------- 07/27/20:Left  mastectomy [name]): three foci of IDC, 3.7cm, 1.5cm, and 1.2cm, with intermediate to high grade DCIS, clear margins, with 1/2 left axillary lymph nodes positive for carcinoma.  Current treatment: Today is cycle 3 Kadcyla Kadcyla toxicities: Denies any adverse effects to Kadcyla. Mild elevation of AST and ALT: Will be monitored.  Echocardiogram 08/10/2020: EF 65 to 70%  Smoking cessation: I sent a prescription for NicoDerm patches Pelvic pressure: Patient will make an appointment to see Dr. Denman George.  Return to clinic every 3 weeks for Kadcyla.    Orders Placed This Encounter  Procedures   PTH, intact and calcium    Standing Status:   Future    Standing Expiration Date:   09/29/2021   The patient has a good understanding of the overall plan. she agrees with it. she will call with any problems that may develop before the next visit here.  Total time spent: 30 mins including face to face time and time spent for planning, charting and coordination of care  Nicholas Lose, MD 09/29/2020  I, Cloyde Reams Dorshimer, am acting as scribe for Dr. Nicholas Lose.  I have reviewed the above documentation for accuracy and completeness, and I agree with the above.

## 2020-09-29 ENCOUNTER — Ambulatory Visit: Payer: PRIVATE HEALTH INSURANCE

## 2020-09-29 ENCOUNTER — Inpatient Hospital Stay: Payer: Self-pay

## 2020-09-29 ENCOUNTER — Other Ambulatory Visit: Payer: Self-pay

## 2020-09-29 ENCOUNTER — Ambulatory Visit
Admission: RE | Admit: 2020-09-29 | Discharge: 2020-09-29 | Disposition: A | Discharge status: Home / Self Care | Payer: PRIVATE HEALTH INSURANCE | Source: Ambulatory Visit | Attending: Radiation Oncology | Admitting: Radiation Oncology

## 2020-09-29 ENCOUNTER — Inpatient Hospital Stay (HOSPITAL_BASED_OUTPATIENT_CLINIC_OR_DEPARTMENT_OTHER): Payer: Self-pay | Admitting: Hematology and Oncology

## 2020-09-29 ENCOUNTER — Encounter: Payer: Self-pay | Admitting: *Deleted

## 2020-09-29 VITALS — BP 133/81 | HR 98 | Temp 98.2°F | Resp 18

## 2020-09-29 DIAGNOSIS — C50212 Malignant neoplasm of upper-inner quadrant of left female breast: Secondary | ICD-10-CM

## 2020-09-29 DIAGNOSIS — Z170 Estrogen receptor positive status [ER+]: Secondary | ICD-10-CM

## 2020-09-29 DIAGNOSIS — Z95828 Presence of other vascular implants and grafts: Secondary | ICD-10-CM

## 2020-09-29 LAB — CBC WITH DIFFERENTIAL (CANCER CENTER ONLY)
Abs Immature Granulocytes: 0.02 10*3/uL (ref 0.00–0.07)
Basophils Absolute: 0 10*3/uL (ref 0.0–0.1)
Basophils Relative: 0 %
Eosinophils Absolute: 0.2 10*3/uL (ref 0.0–0.5)
Eosinophils Relative: 3 %
HCT: 37.4 % (ref 36.0–46.0)
Hemoglobin: 12.4 g/dL (ref 12.0–15.0)
Immature Granulocytes: 0 %
Lymphocytes Relative: 21 %
Lymphs Abs: 1.2 10*3/uL (ref 0.7–4.0)
MCH: 30.1 pg (ref 26.0–34.0)
MCHC: 33.2 g/dL (ref 30.0–36.0)
MCV: 90.8 fL (ref 80.0–100.0)
Monocytes Absolute: 0.5 10*3/uL (ref 0.1–1.0)
Monocytes Relative: 10 %
Neutro Abs: 3.6 10*3/uL (ref 1.7–7.7)
Neutrophils Relative %: 66 %
Platelet Count: 329 10*3/uL (ref 150–400)
RBC: 4.12 MIL/uL (ref 3.87–5.11)
RDW: 14 % (ref 11.5–15.5)
WBC Count: 5.5 10*3/uL (ref 4.0–10.5)
nRBC: 0 % (ref 0.0–0.2)

## 2020-09-29 LAB — CMP (CANCER CENTER ONLY)
ALT: 46 U/L — ABNORMAL HIGH (ref 0–44)
AST: 41 U/L (ref 15–41)
Albumin: 4.1 g/dL (ref 3.5–5.0)
Alkaline Phosphatase: 55 U/L (ref 38–126)
Anion gap: 9 (ref 5–15)
BUN: 10 mg/dL (ref 6–20)
CO2: 26 mmol/L (ref 22–32)
Calcium: 10.4 mg/dL — ABNORMAL HIGH (ref 8.9–10.3)
Chloride: 105 mmol/L (ref 98–111)
Creatinine: 0.79 mg/dL (ref 0.44–1.00)
GFR, Estimated: >60 mL/min (ref >60)
Glucose, Bld: 85 mg/dL (ref 70–99)
Potassium: 3.8 mmol/L (ref 3.5–5.1)
Sodium: 140 mmol/L (ref 135–145)
Total Bilirubin: 0.4 mg/dL (ref 0.3–1.2)
Total Protein: 7.9 g/dL (ref 6.5–8.1)

## 2020-09-29 MED ORDER — ACETAMINOPHEN 325 MG PO TABS
ORAL_TABLET | ORAL | Status: AC
  Filled 2020-09-29: qty 2

## 2020-09-29 MED ORDER — ACETAMINOPHEN 325 MG PO TABS
650.0000 mg | ORAL_TABLET | Freq: Once | ORAL | Status: AC
  Administered 2020-09-29: 650 mg via ORAL

## 2020-09-29 MED ORDER — SODIUM CHLORIDE 0.9 % IV SOLN
Freq: Once | INTRAVENOUS | Status: AC
  Filled 2020-09-29: qty 250

## 2020-09-29 MED ORDER — DIPHENHYDRAMINE HCL 25 MG PO CAPS
ORAL_CAPSULE | ORAL | Status: AC
  Filled 2020-09-29: qty 2

## 2020-09-29 MED ORDER — HEPARIN SOD (PORK) LOCK FLUSH 100 UNIT/ML IV SOLN
500.0000 [IU] | Freq: Once | INTRAVENOUS | Status: AC | PRN
  Administered 2020-09-29: 500 [IU]
  Filled 2020-09-29: qty 5

## 2020-09-29 MED ORDER — SODIUM CHLORIDE 0.9 % IV SOLN
3.6000 mg/kg | Freq: Once | INTRAVENOUS | Status: AC
  Administered 2020-09-29: 300 mg via INTRAVENOUS
  Filled 2020-09-29: qty 15

## 2020-09-29 MED ORDER — SODIUM CHLORIDE 0.9% FLUSH
10.0000 mL | Freq: Once | INTRAVENOUS | Status: AC
  Administered 2020-09-29: 10 mL
  Filled 2020-09-29: qty 10

## 2020-09-29 MED ORDER — DIPHENHYDRAMINE HCL 25 MG PO CAPS
50.0000 mg | ORAL_CAPSULE | Freq: Once | ORAL | Status: AC
  Administered 2020-09-29: 50 mg via ORAL

## 2020-09-29 MED ORDER — SODIUM CHLORIDE 0.9% FLUSH
10.0000 mL | INTRAVENOUS | Status: DC | PRN
  Administered 2020-09-29: 10 mL
  Filled 2020-09-29: qty 10

## 2020-09-29 NOTE — Patient Instructions (Signed)
[facility] Discharge Instructions for Patients Receiving Chemotherapy  Today you received the following chemotherapy agent: Kadcyla  To help prevent nausea and vomiting after your treatment, we encourage you to take your nausea medication as prescribed.   If you develop nausea and vomiting that is not controlled by your nausea medication, call the clinic.   BELOW ARE SYMPTOMS THAT SHOULD BE REPORTED IMMEDIATELY:  *FEVER GREATER THAN 100.5 F  *CHILLS WITH OR WITHOUT FEVER  NAUSEA AND VOMITING THAT IS NOT CONTROLLED WITH YOUR NAUSEA MEDICATION  *UNUSUAL SHORTNESS OF BREATH  *UNUSUAL BRUISING OR BLEEDING  TENDERNESS IN MOUTH AND THROAT WITH OR WITHOUT PRESENCE OF ULCERS  *URINARY PROBLEMS  *BOWEL PROBLEMS  UNUSUAL RASH Items with * indicate a potential emergency and should be followed up as soon as possible.  Feel free to call the clinic should you have any questions or concerns. The clinic phone number is [phone].  Please show the CHEMO ALERT CARD at check-in to the Emergency Department and triage nurse.     Ado-Trastuzumab Emtansine for injection What is this medicine? ADO-TRASTUZUMAB EMTANSINE (ADD oh traz TOO zuh mab em TAN zine) is a monoclonal antibody combined with chemotherapy. It is used to treat breast cancer. This medicine may be used for other purposes; ask your health care provider or pharmacist if you have questions. COMMON BRAND NAME(S): Kadcyla What should I tell my health care provider before I take this medicine? They need to know if you have any of these conditions:  heart disease  heart failure  infection (especially a virus infection such as chickenpox, cold sores, or herpes)  liver disease  lung or breathing disease, like asthma  tingling of the fingers or toes, or other nerve disorder  an unusual or allergic reaction to ado-trastuzumab emtansine, other medications, foods, dyes, or preservatives  pregnant or trying to  get pregnant  breast-feeding How should I use this medicine? This medicine is for infusion into a vein. It is given by a health care professional in a hospital or clinic setting. Talk to your pediatrician regarding the use of this medicine in children. Special care may be needed. Overdosage: If you think you have taken too much of this medicine contact a poison control center or emergency room at once. NOTE: This medicine is only for you. Do not share this medicine with others. What if I miss a dose? It is important not to miss your dose. Call your doctor or health care professional if you are unable to keep an appointment. What may interact with this medicine? This medicine may also interact with the following medications:  atazanavir  boceprevir  clarithromycin  delavirdine  indinavir  dalfopristin; quinupristin  isoniazid, INH  itraconazole  ketoconazole  nefazodone  nelfinavir  ritonavir  telaprevir  telithromycin  tipranavir  voriconazole This list may not describe all possible interactions. Give your health care provider a list of all the medicines, herbs, non-prescription drugs, or dietary supplements you use. Also tell them if you smoke, drink alcohol, or use illegal drugs. Some items may interact with your medicine. What should I watch for while using this medicine? Visit your doctor for checks on your progress. This drug may make you feel generally unwell. This is not uncommon, as chemotherapy can affect healthy cells as well as cancer cells. Report any side effects. Continue your course of treatment even though you feel ill unless your doctor tells you to stop. You may need blood work done while you are   taking this medicine. Call your doctor or health care professional for advice if you get a fever, chills or sore throat, or other symptoms of a cold or flu. Do not treat yourself. This drug decreases your body's ability to fight infections. Try to avoid being  around people who are sick. Be careful brushing and flossing your teeth or using a toothpick because you may get an infection or bleed more easily. If you have any dental work done, tell your dentist you are receiving this medicine. Avoid taking products that contain aspirin, acetaminophen, ibuprofen, naproxen, or ketoprofen unless instructed by your doctor. These medicines may hide a fever. Do not become pregnant while taking this medicine or for 7 months after stopping it, men with female partners should use contraception during treatment and for 4 months after the last dose. Women should inform their doctor if they wish to become pregnant or think they might be pregnant. There is a potential for serious side effects to an unborn child. Do not breast-feed an infant while taking this medicine or for 7 months after the last dose. Men who have a partner who is pregnant or who is capable of becoming pregnant should use a condom during sexual activity while taking this medicine and for 4 months after stopping it. Men should inform their doctors if they wish to father a child. This medicine may lower sperm counts. Talk to your health care professional or pharmacist for more information. What side effects may I notice from receiving this medicine? Side effects that you should report to your doctor or health care professional as soon as possible:  allergic reactions like skin rash, itching or hives, swelling of the face, lips, or tongue  breathing problems  chest pain or palpitations  fever or chills, sore throat  general ill feeling or flu-like symptoms  light-colored stools  nausea, vomiting  pain, tingling, numbness in the hands or feet  signs and symptoms of bleeding such as bloody or black, tarry stools; red or dark-brown urine; spitting up blood or brown material that looks like coffee grounds; red spots on the skin; unusual bruising or bleeding from the eye, gums, or nose  swelling of the  legs or ankles  yellowing of the eyes or skin Side effects that usually do not require medical attention (report to your doctor or health care professional if they continue or are bothersome):  changes in taste  constipation  dizziness  headache  joint pain  muscle pain  trouble sleeping  unusually weak or tired This list may not describe all possible side effects. Call your doctor for medical advice about side effects. You may report side effects to FDA at 1-800-FDA-1088. Where should I keep my medicine? This drug is given in a hospital or clinic and will not be stored at home. NOTE: This sheet is a summary. It may not cover all possible information. If you have questions about this medicine, talk to your doctor, pharmacist, or health care provider.  2020 Elsevier/Gold Standard (2018-01-19 10:03:15)   

## 2020-09-29 NOTE — Assessment & Plan Note (Signed)
Breast cancer and vulvar cancers  02/20/2020:PET scan on 02/07/20 following a diagnosis of vulvar cancer showed a 1.7cm left breast mass and mildly hypermetabolic left axillary lymph nodes. Mammogram and [place] on 02/18/20 showed a 3.1cm left breast mass at the 11 o'clock position, palpable on exam, and a single abnormal left axillary lymph node with cortical thickening. Left breast biopsy on 02/20/20 showed invasive and in situ carcinoma in the breast and axilla, grade 3, HER-2 positive (3+), ER/PR+ 95%, Ki67 75%  Treatment plan: 1.Neoadjuvant chemoradiation for vulvar cancer with Taxol, carboplatin, Herceptin weekly x6 cycles 2.neoadjuvant chemotherapy with TCH Perjeta x3 cycles 3.breast conserving surgery if possible with sentinel lymph node study 4. Followed by adjuvant radiation therapy if patient had lumpectomy 5.Kadcyla maintenance 6.Adjuvant antiestrogen therapy 7.Adjuvant neratinib ------------------------------------------------------------------------------------------------------------------- 07/27/20:Left mastectomy [name]): three foci of IDC, 3.7cm, 1.5cm, and 1.2cm, with intermediate to high grade DCIS, clear margins, with 1/2 left axillary lymph nodes positive for carcinoma.  Current treatment: Today is cycle 3 Kadcyla Kadcyla toxicities: Denies any adverse effects to Kadcyla. Mild elevation of AST and ALT: Will be monitored.  Echocardiogram 08/10/2020: EF 65 to 70%  Smoking cessation: I sent a prescription for NicoDerm patches  Return to clinic every 3 weeks for Kadcyla.

## 2020-09-30 ENCOUNTER — Telehealth: Payer: Self-pay | Admitting: Hematology and Oncology

## 2020-09-30 ENCOUNTER — Ambulatory Visit: Payer: PRIVATE HEALTH INSURANCE

## 2020-09-30 ENCOUNTER — Ambulatory Visit
Admission: RE | Admit: 2020-09-30 | Discharge: 2020-09-30 | Disposition: A | Discharge status: Home / Self Care | Payer: PRIVATE HEALTH INSURANCE | Source: Ambulatory Visit | Attending: Radiation Oncology | Admitting: Radiation Oncology

## 2020-09-30 ENCOUNTER — Other Ambulatory Visit: Payer: Self-pay

## 2020-09-30 DIAGNOSIS — C50212 Malignant neoplasm of upper-inner quadrant of left female breast: Secondary | ICD-10-CM | POA: Diagnosis not present

## 2020-09-30 NOTE — Telephone Encounter (Signed)
No 1/25 los. No changes made to pt's schedule.  

## 2020-10-01 ENCOUNTER — Ambulatory Visit: Payer: PRIVATE HEALTH INSURANCE

## 2020-10-01 ENCOUNTER — Ambulatory Visit
Admission: RE | Admit: 2020-10-01 | Discharge: 2020-10-01 | Disposition: A | Discharge status: Home / Self Care | Payer: PRIVATE HEALTH INSURANCE | Source: Ambulatory Visit | Attending: Radiation Oncology | Admitting: Radiation Oncology

## 2020-10-01 DIAGNOSIS — C50212 Malignant neoplasm of upper-inner quadrant of left female breast: Secondary | ICD-10-CM | POA: Diagnosis not present

## 2020-10-02 ENCOUNTER — Ambulatory Visit
Admission: RE | Admit: 2020-10-02 | Discharge: 2020-10-02 | Disposition: A | Discharge status: Home / Self Care | Payer: PRIVATE HEALTH INSURANCE | Source: Ambulatory Visit | Attending: Radiation Oncology | Admitting: Radiation Oncology

## 2020-10-02 ENCOUNTER — Ambulatory Visit: Payer: PRIVATE HEALTH INSURANCE

## 2020-10-02 DIAGNOSIS — C50212 Malignant neoplasm of upper-inner quadrant of left female breast: Secondary | ICD-10-CM | POA: Diagnosis not present

## 2020-10-05 ENCOUNTER — Ambulatory Visit
Admission: RE | Admit: 2020-10-05 | Discharge: 2020-10-05 | Disposition: A | Discharge status: Home / Self Care | Payer: PRIVATE HEALTH INSURANCE | Source: Ambulatory Visit | Attending: Radiation Oncology | Admitting: Radiation Oncology

## 2020-10-05 ENCOUNTER — Other Ambulatory Visit: Payer: Self-pay

## 2020-10-05 ENCOUNTER — Ambulatory Visit: Payer: PRIVATE HEALTH INSURANCE

## 2020-10-05 DIAGNOSIS — C50212 Malignant neoplasm of upper-inner quadrant of left female breast: Secondary | ICD-10-CM | POA: Diagnosis not present

## 2020-10-06 ENCOUNTER — Other Ambulatory Visit: Payer: Self-pay

## 2020-10-06 ENCOUNTER — Ambulatory Visit
Admission: RE | Admit: 2020-10-06 | Discharge: 2020-10-06 | Disposition: A | Discharge status: Home / Self Care | Payer: Self-pay | Source: Ambulatory Visit | Attending: Radiation Oncology | Admitting: Radiation Oncology

## 2020-10-06 ENCOUNTER — Ambulatory Visit: Payer: Self-pay

## 2020-10-06 ENCOUNTER — Ambulatory Visit: Payer: Self-pay | Admitting: Radiation Oncology

## 2020-10-06 DIAGNOSIS — C50912 Malignant neoplasm of unspecified site of left female breast: Secondary | ICD-10-CM | POA: Insufficient documentation

## 2020-10-06 DIAGNOSIS — C50212 Malignant neoplasm of upper-inner quadrant of left female breast: Secondary | ICD-10-CM | POA: Insufficient documentation

## 2020-10-06 DIAGNOSIS — Z170 Estrogen receptor positive status [ER+]: Secondary | ICD-10-CM | POA: Insufficient documentation

## 2020-10-06 MED FILL — LORAZEPAM 1 MG TABS: 1 | 30 days supply | Qty: 90 | Fill #2

## 2020-10-07 ENCOUNTER — Ambulatory Visit
Admission: RE | Admit: 2020-10-07 | Discharge: 2020-10-07 | Disposition: A | Discharge status: Home / Self Care | Payer: Self-pay | Source: Ambulatory Visit | Attending: Radiation Oncology | Admitting: Radiation Oncology

## 2020-10-07 ENCOUNTER — Other Ambulatory Visit: Payer: Self-pay

## 2020-10-07 ENCOUNTER — Ambulatory Visit: Payer: Self-pay

## 2020-10-08 ENCOUNTER — Ambulatory Visit
Admission: RE | Admit: 2020-10-08 | Discharge: 2020-10-08 | Disposition: A | Discharge status: Home / Self Care | Payer: Self-pay | Source: Ambulatory Visit | Attending: Radiation Oncology | Admitting: Radiation Oncology

## 2020-10-08 ENCOUNTER — Ambulatory Visit: Payer: Self-pay

## 2020-10-09 ENCOUNTER — Other Ambulatory Visit: Payer: Self-pay

## 2020-10-09 ENCOUNTER — Ambulatory Visit: Payer: Self-pay

## 2020-10-09 ENCOUNTER — Ambulatory Visit
Admission: RE | Admit: 2020-10-09 | Discharge: 2020-10-09 | Disposition: A | Discharge status: Home / Self Care | Payer: Self-pay | Source: Ambulatory Visit | Attending: Radiation Oncology | Admitting: Radiation Oncology

## 2020-10-12 ENCOUNTER — Ambulatory Visit: Payer: Self-pay

## 2020-10-12 ENCOUNTER — Other Ambulatory Visit: Payer: Self-pay

## 2020-10-12 ENCOUNTER — Ambulatory Visit
Admission: RE | Admit: 2020-10-12 | Discharge: 2020-10-12 | Disposition: A | Discharge status: Home / Self Care | Payer: Self-pay | Source: Ambulatory Visit | Attending: Radiation Oncology | Admitting: Radiation Oncology

## 2020-10-13 ENCOUNTER — Ambulatory Visit: Payer: Self-pay

## 2020-10-13 ENCOUNTER — Ambulatory Visit
Admission: RE | Admit: 2020-10-13 | Discharge: 2020-10-13 | Disposition: A | Discharge status: Home / Self Care | Payer: Self-pay | Source: Ambulatory Visit | Attending: Radiation Oncology | Admitting: Radiation Oncology

## 2020-10-13 ENCOUNTER — Ambulatory Visit: Payer: Self-pay | Admitting: Radiation Oncology

## 2020-10-13 ENCOUNTER — Other Ambulatory Visit: Payer: Self-pay

## 2020-10-14 ENCOUNTER — Ambulatory Visit
Admission: RE | Admit: 2020-10-14 | Discharge: 2020-10-14 | Disposition: A | Discharge status: Home / Self Care | Payer: Self-pay | Source: Ambulatory Visit | Attending: Radiation Oncology | Admitting: Radiation Oncology

## 2020-10-14 ENCOUNTER — Ambulatory Visit: Payer: Self-pay

## 2020-10-14 DIAGNOSIS — C50912 Malignant neoplasm of unspecified site of left female breast: Secondary | ICD-10-CM

## 2020-10-15 ENCOUNTER — Ambulatory Visit: Payer: Self-pay

## 2020-10-15 ENCOUNTER — Ambulatory Visit
Admission: RE | Admit: 2020-10-15 | Discharge: 2020-10-15 | Disposition: A | Discharge status: Home / Self Care | Payer: Self-pay | Source: Ambulatory Visit | Attending: Radiation Oncology | Admitting: Radiation Oncology

## 2020-10-15 DIAGNOSIS — C50212 Malignant neoplasm of upper-inner quadrant of left female breast: Secondary | ICD-10-CM

## 2020-10-16 ENCOUNTER — Ambulatory Visit: Payer: Self-pay

## 2020-10-16 ENCOUNTER — Other Ambulatory Visit: Payer: Self-pay

## 2020-10-16 ENCOUNTER — Ambulatory Visit
Admission: RE | Admit: 2020-10-16 | Discharge: 2020-10-16 | Disposition: A | Discharge status: Home / Self Care | Payer: Self-pay | Source: Ambulatory Visit | Attending: Radiation Oncology | Admitting: Radiation Oncology

## 2020-10-19 ENCOUNTER — Other Ambulatory Visit: Payer: Self-pay | Admitting: Hematology and Oncology

## 2020-10-19 ENCOUNTER — Ambulatory Visit
Admission: RE | Admit: 2020-10-19 | Discharge: 2020-10-19 | Disposition: A | Discharge status: Home / Self Care | Payer: Self-pay | Source: Ambulatory Visit | Attending: Radiation Oncology | Admitting: Radiation Oncology

## 2020-10-19 ENCOUNTER — Other Ambulatory Visit: Payer: Self-pay

## 2020-10-19 ENCOUNTER — Ambulatory Visit: Payer: Self-pay

## 2020-10-19 MED FILL — VENLAFAXINE HCL ER 37.5 MG: 37.5 | 30 days supply | Qty: 90 | Fill #0

## 2020-10-19 NOTE — Assessment & Plan Note (Signed)
Breast cancer and vulvar cancers  02/20/2020:PET scan on 02/07/20 following a diagnosis of vulvar cancer showed a 1.7cm left breast mass and mildly hypermetabolic left axillary lymph nodes. Mammogram and [place] on 02/18/20 showed a 3.1cm left breast mass at the 11 o'clock position, palpable on exam, and a single abnormal left axillary lymph node with cortical thickening. Left breast biopsy on 02/20/20 showed invasive and in situ carcinoma in the breast and axilla, grade 3, HER-2 positive (3+), ER/PR+ 95%, Ki67 75%  Treatment plan: 1.Neoadjuvant chemoradiation for vulvar cancer with Taxol, carboplatin, Herceptin weekly x6 cycles 2.neoadjuvant chemotherapy with TCH Perjeta x3 cycles 3.breast conserving surgery if possible with sentinel lymph node study 4. Followed by adjuvant radiation therapy if patient had lumpectomy 5.Kadcyla maintenance 6.Adjuvant antiestrogen therapy 7.Adjuvant neratinib ------------------------------------------------------------------------------------------------------------------- 07/27/20:Left mastectomy [name]): three foci of IDC, 3.7cm, 1.5cm, and 1.2cm, with intermediate to high grade DCIS, clear margins, with 1/2 left axillary lymph nodes positive for carcinoma.  Current treatment: Todayiscycle 4Kadcyla Kadcyla toxicities:Denies any adverse effects to Kadcyla. Mild elevation of AST and ALT: Will be monitored.  Echocardiogram12/02/2020: EF 65 to 70%  Smoking cessation: I sent a prescription for NicoDerm patches Pelvic pressure: Patient will make an appointment to see Dr. Denman George.  Return to clinic every 3 weeks for Kadcyla.

## 2020-10-19 NOTE — Progress Notes (Signed)
HEMATOLOGY-ONCOLOGY PROGRESS NOTE  CHIEF COMPLIANT: Follow-upfor Kadcyla maintenance  INTERVAL HISTORY: Sandra Miller is a 45 y.o. female with above-mentioned history of left breast cancer and vulvarcancerwho completedneoadjuvant chemotherapy,underwent a left mastectomy, and is currently on Kadcyla maintenance.She also has a history of DVT currently on Xarelto.She presents to the clinic todayfortreatment.  Severe fatigue still persistent. Pelvic pressure and cramps.  Oncology History  Vulvar cancer ([place])  01/29/2020 Initial Diagnosis   Vulvar cancer ([place])   03/11/2020 - 05/04/2020 Chemotherapy   The patient had dexamethasone (DECADRON) 4 MG tablet, 4 mg (100 % of original dose 4 mg), Oral, Daily, 1 of 1 cycle, Start date: 02/27/2020, End date: 05/05/2020 Dose modification: 4 mg (original dose 4 mg, Cycle 0) palonosetron (ALOXI) injection 0.25 mg, 0.25 mg, Intravenous,  Once, 1 of 1 cycle Administration: 0.25 mg (03/11/2020), 0.25 mg (03/17/2020), 0.25 mg (03/24/2020), 0.25 mg (03/31/2020), 0.25 mg (04/07/2020), 0.25 mg (04/14/2020), 0.25 mg (04/21/2020) CARBOplatin (PARAPLATIN) 300 mg in sodium chloride 0.9 % 250 mL chemo infusion, 300 mg (100 % of original dose 300 mg), Intravenous,  Once, 1 of 1 cycle Dose modification:   (original dose 300 mg, Cycle 1) Administration: 300 mg (03/11/2020), 290 mg (03/17/2020), 300 mg (03/24/2020), 230 mg (03/31/2020), 230 mg (04/07/2020), 230 mg (04/14/2020) PACLitaxel (TAXOL) 108 mg in sodium chloride 0.9 % 250 mL chemo infusion (</= 65m/m2), 50 mg/m2 = 108 mg, Intravenous,  Once, 1 of 1 cycle Dose modification: 45 mg/m2 (original dose 50 mg/m2, Cycle 1, Reason: Dose not tolerated) Administration: 108 mg (03/11/2020), 108 mg (03/17/2020), 108 mg (03/24/2020), 96 mg (03/31/2020), 96 mg (04/07/2020), 96 mg (04/14/2020) fosaprepitant (EMEND) 150 mg in sodium chloride 0.9 % 145 mL IVPB, 150 mg, Intravenous,  Once, 1 of 1 cycle Administration: 150 mg (03/24/2020), 150 mg  (03/31/2020), 150 mg (04/07/2020), 150 mg (04/14/2020) trastuzumab-anns (KANJINTI) 378 mg in sodium chloride 0.9 % 250 mL chemo infusion, 4 mg/kg = 378 mg (100 % of original dose 4 mg/kg), Intravenous,  Once, 1 of 1 cycle Dose modification: 4 mg/kg (original dose 4 mg/kg, Cycle 1, Reason: Other (see comments), Comment: loading dose) Administration: 378 mg (03/11/2020), 189 mg (03/17/2020), 189 mg (03/24/2020), 189 mg (03/31/2020), 189 mg (04/07/2020), 189 mg (04/14/2020), 189 mg (04/21/2020), 189 mg (05/04/2020)  for chemotherapy treatment.    Malignant neoplasm of upper-inner quadrant of left breast in [place], [place]  02/20/[place] Initial Diagnosis   PET scan on 02/07/20 following a diagnosis of vulvar cancer showed a 1.7cm left breast mass and mildly hypermetabolic left axillary lymph nodes. [place] showed a 3.1cm left breast mass at the 11 o'clock position, palpable on exam, and a single abnormal left axillary lymph node with cortical thickening. Left breast biopsy on 02/20/20 showed invasive and in situ carcinoma in the breast and axilla, grade 3, HER-2 positive (3+), ER/PR+ 95%, Ki67 75%   02/25/2020 Cancer Staging   Staging form: Breast, AJCC 8th Edition - Clinical stage from 02/25/2020: Stage IB (cT2, cN1, cM0, G3, ER+, PR+, HER2+) - Signed by GNicholas Lose MD on 02/25/2020   05/29/2020 - 05/29/2020 Chemotherapy   The patient had palonosetron (ALOXI) injection 0.25 mg, 0.25 mg, Intravenous,  Once, 1 of 3 cycles Administration: 0.25 mg (05/29/2020) pegfilgrastim-jmdb (FULPHILA) injection 6 mg, 6 mg, Subcutaneous,  Once, 1 of 3 cycles CARBOplatin (PARAPLATIN) 580 mg in sodium chloride 0.9 % 250 mL chemo infusion, 580 mg (100 % of original dose 580 mg), Intravenous,  Once, 1 of 3  cycles Dose modification:   (original dose 580 mg, Cycle 1) Administration: 580 mg (05/29/2020) DOCEtaxel (TAXOTERE) 120 mg in sodium chloride 0.9 % 250 mL chemo infusion, 60 mg/m2 = 120 mg (100  % of original dose 60 mg/m2), Intravenous,  Once, 1 of 3 cycles Dose modification: 60 mg/m2 (original dose 60 mg/m2, Cycle 1, Reason: Provider Judgment), 50 mg/m2 (original dose 60 mg/m2, Cycle 2, Reason: Dose not tolerated) Administration: 120 mg (05/29/2020) fosaprepitant (EMEND) 150 mg in sodium chloride 0.9 % 145 mL IVPB, 150 mg, Intravenous,  Once, 1 of 3 cycles Administration: 150 mg (05/29/2020) pertuzumab (PERJETA) 420 mg in sodium chloride 0.9 % 250 mL chemo infusion, 420 mg (50 % of original dose 840 mg), Intravenous, Once, 1 of 3 cycles Dose modification: 420 mg (original dose 840 mg, Cycle 1, Reason: Provider Judgment) Administration: 420 mg (05/29/2020) trastuzumab-dkst (OGIVRI) 525 mg in sodium chloride 0.9 % 250 mL chemo infusion, 6 mg/kg = 525 mg (75 % of original dose 8 mg/kg), Intravenous,  Once, 1 of 3 cycles Dose modification: 6 mg/kg (original dose 8 mg/kg, Cycle 1, Reason: Provider Judgment) Administration: 525 mg (05/29/2020)  for chemotherapy treatment.    06/22/2020 Genetic Testing   Positive genetic testing: Heterozygous pathogenic variant detected in BRIP1 gene at c.2010dup (p.Glu671*).  Variant of uncertain significance detected in POLD1 at c.532G>A (p.Gly178Arg).  No other pathogenic variants detected in [employer] Common Hereditary Cancers Panel.  The report date is June 22, 2020.   The Common Hereditary Cancers Panel offered by [facility] includes sequencing and/or deletion duplication testing of the following 48 genes: APC, ATM, AXIN2, BARD1, BMPR1A, BRCA1, BRCA2, BRIP1, CDH1, CDK4, CDKN2A (p14ARF), CDKN2A (p16INK4a), CHEK2, CTNNA1, DICER1, EPCAM (Deletion/duplication testing only), GREM1 (promoter region deletion/duplication testing only), KIT, MEN1, MLH1, MSH2, MSH3, MSH6, MUTYH, NBN, NF1, NHTL1, PALB2, PDGFRA, PMS2, POLD1, POLE, PTEN, RAD50, RAD51C, RAD51D, RNF43, SDHB, SDHC, SDHD, SMAD4, SMARCA4. STK11, TP53, TSC1, TSC2, and VHL.  The following genes were evaluated for  sequence changes only: SDHA and HOXB13 c.251G>A variant only.   07/27/2020 Surgery   Left mastectomy [name]): three foci of IDC, 3.7cm, 1.5cm, and 1.2cm, with intermediate to high grade DCIS, clear margins, with 1/2 left axillary lymph nodes positive for carcinoma.    08/18/2020 -  Chemotherapy    Patient is on Treatment Plan: BREAST ADO-TRASTUZUMAB EMTANSINE (KADCYLA) Q21D        Observations/Objective:  There were no vitals filed for this visit. There is no height or weight on file to calculate BMI.  I have reviewed the data as listed CMP Latest Ref Rng & Units 09/29/2020 09/08/2020 08/18/2020  Glucose 70 - 99 mg/dL 85 100(H) 81  BUN 6 - 20 mg/dL _0 Creatinine 0.44 - 1.00 mg/dL 0.79 0.76 0.74  Sodium 135 - 145 mmol/L 140 139 139  Potassium 3.5 - 5.1 mmol/L 3.8 3.8 3.5  Chloride 98 - 111 mmol/L 105 104 106  CO2 22 - 32 mmol/L _1 Calcium 8.9 - 10.3 mg/dL 10.4(H) 10.5(H) 10.1  Total Protein 6.5 - 8.1 g/dL 7.9 7.8 7.1  Total Bilirubin 0.3 - 1.2 mg/dL 0.4 0.5 0.4  Alkaline Phos 38 - 126 U/L 55 53 52  AST 15 - 41 U/L 41 51(H) 20  ALT 0 - 44 U/L 46(H) 60(H) 17    Lab Results  Component Value Date   WBC 5.5 09/29/2020   HGB 12.4 09/29/2020   HCT 37.4 09/29/2020   MCV 90.8 09/29/2020   PLT 329  09/29/2020   NEUTROABS 3.6 09/29/2020      Assessment Plan:  Malignant neoplasm of upper-inner quadrant of left breast in female, estrogen receptor positive ([place]) Breast cancer and vulvar cancers  02/20/2020:PET scan on 02/07/20 following a diagnosis of vulvar cancer showed a 1.7cm left breast mass and mildly hypermetabolic left axillary lymph nodes. Mammogram and [place] on 02/18/20 showed a 3.1cm left breast mass at the 11 o'clock position, palpable on exam, and a single abnormal left axillary lymph node with cortical thickening. Left breast biopsy on 02/20/20 showed invasive and in situ carcinoma in the breast and axilla, grade 3, HER-2 positive (3+), ER/PR+ 95%, Ki67  75%  Treatment plan: 1.Neoadjuvant chemoradiation for vulvar cancer with Taxol, carboplatin, Herceptin weekly x6 cycles 2.neoadjuvant chemotherapy with TCH Perjeta x3 cycles 3.breast conserving surgery if possible with sentinel lymph node study 4. Followed by adjuvant radiation therapy to be completed on 10/23/20 5.Kadcyla maintenance 6.Adjuvant antiestrogen therapy 7.Adjuvant neratinib ------------------------------------------------------------------------------------------------------------------- 07/27/20:Left mastectomy [name]): three foci of IDC, 3.7cm, 1.5cm, and 1.2cm, with intermediate to high grade DCIS, clear margins, with 1/2 left axillary lymph nodes positive for carcinoma.  Current treatment: Todayiscycle 4Kadcyla Kadcyla toxicities:Denies any adverse effects to Kadcyla. Mild elevation of AST and ALT: Will be monitored. ASt 41 and ALT 46  Echocardiogram12/02/2020: EF 65 to 70%  Smoking cessation: I sent a prescription for NicoDerm patches  Pelvic pressure/ cramps: Patient will make an appointment to see Dr. Denman George.  Will start anti estrogen pill after XRT is done She will decrease her potassium and magnesium to once daily  Return to clinic every 3 weeks for Kadcyla.    I discussed the assessment and treatment plan with the patient. The patient was provided an opportunity to ask questions and all were answered. The patient agreed with the plan and demonstrated an understanding of the instructions. The patient was advised to call back or seek an in-person evaluation if the symptoms worsen or if the condition fails to improve as anticipated.   Total time spent: 30 minutes including face-to-face MyChart video visit time and time spent for planning, charting and coordination of care  Rulon Eisenmenger, MD 10/20/2020   I, Cloyde Reams Dorshimer, am acting as scribe for Nicholas Lose, MD.  I have reviewed the above documentation for accuracy and completeness, and  I agree with the above.

## 2020-10-20 ENCOUNTER — Inpatient Hospital Stay: Payer: Self-pay

## 2020-10-20 ENCOUNTER — Other Ambulatory Visit: Payer: Self-pay

## 2020-10-20 ENCOUNTER — Other Ambulatory Visit: Payer: Self-pay | Admitting: Internal Medicine

## 2020-10-20 ENCOUNTER — Inpatient Hospital Stay (HOSPITAL_BASED_OUTPATIENT_CLINIC_OR_DEPARTMENT_OTHER): Payer: Self-pay | Admitting: Hematology and Oncology

## 2020-10-20 ENCOUNTER — Inpatient Hospital Stay: Payer: Self-pay | Attending: Gynecologic Oncology

## 2020-10-20 ENCOUNTER — Ambulatory Visit
Admission: RE | Admit: 2020-10-20 | Discharge: 2020-10-20 | Disposition: A | Discharge status: Home / Self Care | Payer: Self-pay | Source: Ambulatory Visit | Attending: Radiation Oncology | Admitting: Radiation Oncology

## 2020-10-20 ENCOUNTER — Telehealth: Payer: Self-pay | Admitting: *Deleted

## 2020-10-20 VITALS — BP 120/76 | HR 91 | Temp 98.2°F | Resp 20

## 2020-10-20 DIAGNOSIS — Z170 Estrogen receptor positive status [ER+]: Secondary | ICD-10-CM | POA: Insufficient documentation

## 2020-10-20 DIAGNOSIS — F17210 Nicotine dependence, cigarettes, uncomplicated: Secondary | ICD-10-CM | POA: Insufficient documentation

## 2020-10-20 DIAGNOSIS — Z1502 Genetic susceptibility to malignant neoplasm of ovary: Secondary | ICD-10-CM | POA: Insufficient documentation

## 2020-10-20 DIAGNOSIS — C50212 Malignant neoplasm of upper-inner quadrant of left female breast: Secondary | ICD-10-CM

## 2020-10-20 DIAGNOSIS — Z8544 Personal history of malignant neoplasm of other female genital organs: Secondary | ICD-10-CM | POA: Insufficient documentation

## 2020-10-20 DIAGNOSIS — R5383 Other fatigue: Secondary | ICD-10-CM | POA: Insufficient documentation

## 2020-10-20 DIAGNOSIS — R252 Cramp and spasm: Secondary | ICD-10-CM | POA: Insufficient documentation

## 2020-10-20 DIAGNOSIS — Z5112 Encounter for antineoplastic immunotherapy: Secondary | ICD-10-CM | POA: Insufficient documentation

## 2020-10-20 DIAGNOSIS — C519 Malignant neoplasm of vulva, unspecified: Secondary | ICD-10-CM

## 2020-10-20 DIAGNOSIS — Z9012 Acquired absence of left breast and nipple: Secondary | ICD-10-CM

## 2020-10-20 DIAGNOSIS — Z79899 Other long term (current) drug therapy: Secondary | ICD-10-CM

## 2020-10-20 DIAGNOSIS — R102 Pelvic and perineal pain: Secondary | ICD-10-CM | POA: Insufficient documentation

## 2020-10-20 DIAGNOSIS — R7401 Elevation of levels of liver transaminase levels: Secondary | ICD-10-CM

## 2020-10-20 DIAGNOSIS — Z95828 Presence of other vascular implants and grafts: Secondary | ICD-10-CM

## 2020-10-20 DIAGNOSIS — Z923 Personal history of irradiation: Secondary | ICD-10-CM | POA: Insufficient documentation

## 2020-10-20 DIAGNOSIS — Z7901 Long term (current) use of anticoagulants: Secondary | ICD-10-CM | POA: Insufficient documentation

## 2020-10-20 DIAGNOSIS — Z86718 Personal history of other venous thrombosis and embolism: Secondary | ICD-10-CM | POA: Insufficient documentation

## 2020-10-20 LAB — CMP (CANCER CENTER ONLY)
ALT: 27 U/L (ref 0–44)
AST: 29 U/L (ref 15–41)
Albumin: 4.1 g/dL (ref 3.5–5.0)
Alkaline Phosphatase: 49 U/L (ref 38–126)
Anion gap: 10 (ref 5–15)
BUN: 13 mg/dL (ref 6–20)
CO2: 27 mmol/L (ref 22–32)
Calcium: 9.8 mg/dL (ref 8.9–10.3)
Chloride: 102 mmol/L (ref 98–111)
Creatinine: 0.81 mg/dL (ref 0.44–1.00)
GFR, Estimated: >60 mL/min (ref >60)
Glucose, Bld: 89 mg/dL (ref 70–99)
Potassium: 3.7 mmol/L (ref 3.5–5.1)
Sodium: 139 mmol/L (ref 135–145)
Total Bilirubin: 0.5 mg/dL (ref 0.3–1.2)
Total Protein: 7.7 g/dL (ref 6.5–8.1)

## 2020-10-20 LAB — CBC WITH DIFFERENTIAL (CANCER CENTER ONLY)
Abs Immature Granulocytes: 0.02 10*3/uL (ref 0.00–0.07)
Basophils Absolute: 0 10*3/uL (ref 0.0–0.1)
Basophils Relative: 0 %
Eosinophils Absolute: 0.1 10*3/uL (ref 0.0–0.5)
Eosinophils Relative: 2 %
HCT: 35.9 % — ABNORMAL LOW (ref 36.0–46.0)
Hemoglobin: 11.9 g/dL — ABNORMAL LOW (ref 12.0–15.0)
Immature Granulocytes: 0 %
Lymphocytes Relative: 13 %
Lymphs Abs: 0.9 10*3/uL (ref 0.7–4.0)
MCH: 29.4 pg (ref 26.0–34.0)
MCHC: 33.1 g/dL (ref 30.0–36.0)
MCV: 88.6 fL (ref 80.0–100.0)
Monocytes Absolute: 0.5 10*3/uL (ref 0.1–1.0)
Monocytes Relative: 8 %
Neutro Abs: 4.8 10*3/uL (ref 1.7–7.7)
Neutrophils Relative %: 77 %
Platelet Count: 294 10*3/uL (ref 150–400)
RBC: 4.05 MIL/uL (ref 3.87–5.11)
RDW: 14.1 % (ref 11.5–15.5)
WBC Count: 6.3 10*3/uL (ref 4.0–10.5)
nRBC: 0 % (ref 0.0–0.2)

## 2020-10-20 MED ORDER — SODIUM CHLORIDE 0.9 % IV SOLN
3.6000 mg/kg | Freq: Once | INTRAVENOUS | Status: AC
  Administered 2020-10-20: 300 mg via INTRAVENOUS
  Filled 2020-10-20: qty 15

## 2020-10-20 MED ORDER — SODIUM CHLORIDE 0.9% FLUSH
10.0000 mL | Freq: Once | INTRAVENOUS | Status: AC
  Administered 2020-10-20: 10 mL
  Filled 2020-10-20: qty 10

## 2020-10-20 MED ORDER — HEPARIN SOD (PORK) LOCK FLUSH 100 UNIT/ML IV SOLN
500.0000 [IU] | Freq: Once | INTRAVENOUS | Status: AC | PRN
  Administered 2020-10-20: 500 [IU]
  Filled 2020-10-20: qty 5

## 2020-10-20 MED ORDER — SODIUM CHLORIDE 0.9% FLUSH
10.0000 mL | INTRAVENOUS | Status: DC | PRN
  Administered 2020-10-20: 10 mL
  Filled 2020-10-20: qty 10

## 2020-10-20 MED ORDER — SODIUM CHLORIDE 0.9 % IV SOLN
Freq: Once | INTRAVENOUS | Status: AC
  Filled 2020-10-20: qty 250

## 2020-10-20 MED ORDER — RIVAROXABAN 20 MG PO TABS: 20.0000 mg | ORAL_TABLET | Freq: Every day | ORAL | 6 refills | Status: DC

## 2020-10-20 MED ORDER — DIPHENHYDRAMINE HCL 25 MG PO CAPS
ORAL_CAPSULE | ORAL | Status: AC
  Filled 2020-10-20: qty 2

## 2020-10-20 MED ORDER — DIPHENHYDRAMINE HCL 25 MG PO CAPS
50.0000 mg | ORAL_CAPSULE | Freq: Once | ORAL | Status: AC
  Administered 2020-10-20: 50 mg via ORAL

## 2020-10-20 MED ORDER — OMEPRAZOLE 40 MG PO CPDR: 40.0000 mg | DELAYED_RELEASE_CAPSULE | Freq: Two times a day (BID) | ORAL | 0 refills | Status: DC

## 2020-10-20 MED ORDER — ACETAMINOPHEN 325 MG PO TABS
650.0000 mg | ORAL_TABLET | Freq: Once | ORAL | Status: AC
  Administered 2020-10-20: 650 mg via ORAL

## 2020-10-20 MED ORDER — ACETAMINOPHEN 325 MG PO TABS
ORAL_TABLET | ORAL | Status: AC
  Filled 2020-10-20: qty 2

## 2020-10-20 MED FILL — OMEPRAZOLE 40 MG CPDR: 40 | 90 days supply | Qty: 180 | Fill #0

## 2020-10-20 NOTE — Patient Instructions (Signed)
Implanted Port Insertion, Care After This sheet gives you information about how to care for yourself after your procedure. Your health care provider may also give you more specific instructions. If you have problems or questions, contact your health care provider. What can I expect after the procedure? After the procedure, it is common to have:  Discomfort at the port insertion site.  Bruising on the skin over the port. This should improve over 3-4 days. Follow these instructions at home: Port care  After your port is placed, you will get a manufacturer's information card. The card has information about your port. Keep this card with you at all times.  Take care of the port as told by your health care provider. Ask your health care provider if you or a family member can get training for taking care of the port at home. A home health care nurse may also take care of the port.  Make sure to remember what type of port you have. Incision care  Follow instructions from your health care provider about how to take care of your port insertion site. Make sure you: ? Wash your hands with soap and water before and after you change your bandage (dressing). If soap and water are not available, use hand sanitizer. ? Change your dressing as told by your health care provider. ? Leave stitches (sutures), skin glue, or adhesive strips in place. These skin closures may need to stay in place for 2 weeks or longer. If adhesive strip edges start to loosen and curl up, you may trim the loose edges. Do not remove adhesive strips completely unless your health care provider tells you to do that.  Check your port insertion site every day for signs of infection. Check for: ? Redness, swelling, or pain. ? Fluid or blood. ? Warmth. ? Pus or a bad smell.      Activity  Return to your normal activities as told by your health care provider. Ask your health care provider what activities are safe for you.  Do not  lift anything that is heavier than 10 lb (4.5 kg), or the limit that you are told, until your health care provider says that it is safe. General instructions  Take over-the-counter and prescription medicines only as told by your health care provider.  Do not take baths, swim, or use a hot tub until your health care provider approves. Ask your health care provider if you may take showers. You may only be allowed to take sponge baths.  Do not drive for 24 hours if you were given a sedative during your procedure.  Wear a medical alert bracelet in case of an emergency. This will tell any health care providers that you have a port.  Keep all follow-up visits as told by your health care provider. This is important. Contact a health care provider if:  You cannot flush your port with saline as directed, or you cannot draw blood from the port.  You have a fever or chills.  You have redness, swelling, or pain around your port insertion site.  You have fluid or blood coming from your port insertion site.  Your port insertion site feels warm to the touch.  You have pus or a bad smell coming from the port insertion site. Get help right away if:  You have chest pain or shortness of breath.  You have bleeding from your port that you cannot control. Summary  Take care of the port as told by your   health care provider. Keep the manufacturer's information card with you at all times.  Change your dressing as told by your health care provider.  Contact a health care provider if you have a fever or chills or if you have redness, swelling, or pain around your port insertion site.  Keep all follow-up visits as told by your health care provider. This information is not intended to replace advice given to you by your health care provider. Make sure you discuss any questions you have with your health care provider. Document Revised: 03/20/2018 Document Reviewed: 03/20/2018 Elsevier Patient Education   2021 Elsevier Inc.  

## 2020-10-20 NOTE — Telephone Encounter (Signed)
Per Dr Lindi Adie, patient needed an earlier appt. App moved to 2/25. Patient given new appt date/time

## 2020-10-20 NOTE — Patient Instructions (Addendum)
[facility] Discharge Instructions for Patients Receiving Chemotherapy  Today you received the following  Immunotherapy agent: Ado-Trastuzumab (Kadcyla)  To help prevent nausea and vomiting after your treatment, we encourage you to take your nausea medication as directed by your MD.   If you develop nausea and vomiting that is not controlled by your nausea medication, call the clinic.   BELOW ARE SYMPTOMS THAT SHOULD BE REPORTED IMMEDIATELY:  *FEVER GREATER THAN 100.5 F  *CHILLS WITH OR WITHOUT FEVER  NAUSEA AND VOMITING THAT IS NOT CONTROLLED WITH YOUR NAUSEA MEDICATION  *UNUSUAL SHORTNESS OF BREATH  *UNUSUAL BRUISING OR BLEEDING  TENDERNESS IN MOUTH AND THROAT WITH OR WITHOUT PRESENCE OF ULCERS  *URINARY PROBLEMS  *BOWEL PROBLEMS  UNUSUAL RASH Items with * indicate a potential emergency and should be followed up as soon as possible.  Feel free to call the clinic should you have any questions or concerns. The clinic phone number is [phone].  Please show the [facility] at check-in to the Emergency Department and triage nurse.

## 2020-10-21 ENCOUNTER — Ambulatory Visit: Payer: Self-pay

## 2020-10-21 ENCOUNTER — Ambulatory Visit
Admission: RE | Admit: 2020-10-21 | Discharge: 2020-10-21 | Disposition: A | Discharge status: Home / Self Care | Payer: Medicaid Other | Source: Ambulatory Visit | Attending: Radiation Oncology | Admitting: Radiation Oncology

## 2020-10-21 LAB — PTH, INTACT AND CALCIUM
Calcium, Total (PTH): 9.8 mg/dL (ref 8.7–10.2)
PTH: 30 pg/mL (ref 15–65)

## 2020-10-22 ENCOUNTER — Ambulatory Visit: Payer: Self-pay

## 2020-10-22 ENCOUNTER — Ambulatory Visit
Admission: RE | Admit: 2020-10-22 | Discharge: 2020-10-22 | Disposition: A | Discharge status: Home / Self Care | Payer: Self-pay | Source: Ambulatory Visit | Attending: Radiation Oncology | Admitting: Radiation Oncology

## 2020-10-22 ENCOUNTER — Encounter: Payer: Self-pay | Admitting: *Deleted

## 2020-10-23 ENCOUNTER — Other Ambulatory Visit: Payer: Self-pay

## 2020-10-23 ENCOUNTER — Encounter: Payer: Self-pay | Admitting: Radiation Oncology

## 2020-10-23 ENCOUNTER — Other Ambulatory Visit: Payer: Self-pay | Admitting: Hematology and Oncology

## 2020-10-23 ENCOUNTER — Encounter: Payer: Self-pay | Admitting: *Deleted

## 2020-10-23 ENCOUNTER — Ambulatory Visit
Admission: RE | Admit: 2020-10-23 | Discharge: 2020-10-23 | Disposition: A | Discharge status: Home / Self Care | Payer: Self-pay | Source: Ambulatory Visit | Attending: Radiation Oncology | Admitting: Radiation Oncology

## 2020-10-23 ENCOUNTER — Other Ambulatory Visit: Payer: Self-pay | Admitting: *Deleted

## 2020-10-23 MED ORDER — RIVAROXABAN 20 MG PO TABS: 20.0000 mg | ORAL_TABLET | Freq: Every day | ORAL | 6 refills | Status: DC

## 2020-10-23 MED FILL — XARELTO 20 MG TABLET: 20 | 30 days supply | Qty: 30 | Fill #0

## 2020-10-23 MED FILL — POTASSIUM CHLORIDE CRYS ER: 20 | 30 days supply | Qty: 60 | Fill #0

## 2020-10-29 ENCOUNTER — Encounter: Payer: Self-pay | Admitting: Gynecologic Oncology

## 2020-10-29 ENCOUNTER — Telehealth: Payer: Self-pay

## 2020-10-29 NOTE — Telephone Encounter (Signed)
TC to review MU.  No answer, left message to return call.

## 2020-10-30 ENCOUNTER — Inpatient Hospital Stay (HOSPITAL_BASED_OUTPATIENT_CLINIC_OR_DEPARTMENT_OTHER): Payer: Medicaid Other | Admitting: Gynecologic Oncology

## 2020-10-30 ENCOUNTER — Other Ambulatory Visit: Payer: Self-pay

## 2020-10-30 VITALS — BP 126/88 | HR 100 | Temp 97.3°F | Resp 20 | Ht 65.5 in | Wt 197.0 lb

## 2020-10-30 DIAGNOSIS — R102 Pelvic and perineal pain: Secondary | ICD-10-CM

## 2020-10-30 DIAGNOSIS — R1030 Lower abdominal pain, unspecified: Secondary | ICD-10-CM

## 2020-10-30 DIAGNOSIS — Z1589 Genetic susceptibility to other disease: Secondary | ICD-10-CM

## 2020-10-30 DIAGNOSIS — M25559 Pain in unspecified hip: Secondary | ICD-10-CM

## 2020-10-30 DIAGNOSIS — Z1501 Genetic susceptibility to malignant neoplasm of breast: Secondary | ICD-10-CM

## 2020-10-30 DIAGNOSIS — C519 Malignant neoplasm of vulva, unspecified: Secondary | ICD-10-CM

## 2020-10-30 DIAGNOSIS — N912 Amenorrhea, unspecified: Secondary | ICD-10-CM

## 2020-10-30 NOTE — Progress Notes (Signed)
Follow-up Note: Gyn-Onc  Consult was requested by Dr. Pamelia Hoit for the evaluation of Sandra Miller 45 y.o. female  CC:  Chief Complaint  Patient presents with  . Vulvar cancer (HCC)  . Pelvic Pain  . hereditary risk for ovarian cancer    Assessment/Plan:  Sandra Miller  is a 45 y.o.  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 S/p primary radiation completed 05/08/20 with external beam to the pelvis (45 Gy) and boost to the vulva (16.2 Gy). No evidence of recurrent disease on exam. Recommend vulva inspection at 3 monthly intervals until September, 2023.  2/ Deleterious mutation in BRIP 1 conferring increased hereditary risk of ovarian cancer. I am recommending risk reducing bilateral salpingo-oophorectomy.  I do not recommend hysterectomy for this patient given that this particular gene mutation is not associated with the known increased risk for uterine cancer, and the patient has received low pelvic radiation which places her at increased risk for surgical complications associated with hysterectomy, particularly urologic injury or nonhealing vaginal cuff. She does not require BSO for hormone modulation as she is menopausal secondary to pelvic radiation and high-dose chemotherapy in a perimenopausal age. I am recommending follow-up after she is completed her breast cancer infusions.  At that time we will discuss scheduling her for surgery in the late fall/winter 2022.  Anticipate surgery will be a robotic assisted BSO.  3/ pelvic pain:  Normal pelvic exam. Will check pelvic US. If normal, no further interventions at this time.  HPI: Sandra Miller is a 45 year old parous woman with a history of vulvar cancer who was seen in consultation at the request of Dr Pamelia Hoit for evaluation of a deleterious  mutation in BRIP1.  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  At the time of seeking consultation for sentinel lymph node biopsy with Dr. Reyne Dumas at [facility], it was determined that she was not a good candidate for primary radical vulvectomy due to the close proximity of the lesion to her anal sphincter.  Therefore she was treated with definitive external beam radiation to the lower pelvis and vulvar tissues.  Radiation dosing was between 03/16/20 through 05/08/20. It consisted of IMRT to the pelvis (45 Gy) and IMRT boost to the vulva (16.2 Gy).  Simultaneous to her diagnosis of vulvar cancer was a diagnosis of ER/PR positive breast cancer on the left.  This was stage II with positive lymph node.  She underwent treatment with Adriamycin and Taxotere followed by left mastectomy, trastuzumab infusion for HER-2 positive lesion, and was planned for chest wall radiation beginning in the new year of 2022.  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  Interval Hx:  She has begun trastuzumab infusions but this was changed to Kadcyla due to intolerance. Planned sessation of therapy is fall, 2022.  She reported new symptoms of pelvic pain (vague). No discharge or bleeding. She was amenorrheic since completing radiation to the pelvis. She was anxious that the pelvic pain might be from the BRIP1 mutation.   Current Meds:  Outpatient  Encounter Medications as of 10/30/2020  Medication Sig  . acetaminophen (TYLENOL) 500 MG tablet Take 2 tablets (1,000 mg total) by mouth every 6 (six) hours as needed for moderate pain or fever. Avoid tylenol until your liver function back to normal  . diphenoxylate-atropine (LOMOTIL) 2.5-0.025 MG tablet TAKE 1 TO 2 TABLETS BY MOUTH FOUR  TIMES DAILY AS NEEDED FOR DIARRHEA (Patient taking differently: Take 1-2 tablets by mouth 4 (four) times daily as needed for diarrhea or loose stools. TAKE 1 TO 2 TABLETS BY MOUTH FOUR TIMES DAILY AS NEEDED FOR DIARRHEA)  . dronabinol (MARINOL) 2.5 MG capsule Take 1 capsule (2.5 mg total) by mouth 2 (two) times daily before a meal.  . lidocaine-prilocaine (EMLA) cream Apply to affected area once  . lisinopril (ZESTRIL) 20 MG tablet Take 1 tablet (20 mg total) by mouth daily.  Marland Kitchen LORazepam (ATIVAN) 1 MG tablet Take 1 tablet (1 mg total) by mouth 3 (three) times daily as needed for anxiety.  . magnesium oxide (MAG-OX) 400 (241.3 Mg) MG tablet Take 1 tablet (400 mg total) by mouth 2 (two) times daily.  . Multiple Vitamin (MULTIVITAMIN WITH MINERALS) TABS tablet Take 1 tablet by mouth daily.  . nicotine (NICODERM CQ - DOSED IN MG/24 HOURS) 14 mg/24hr patch Place 1 patch (14 mg total) onto the skin daily.  Marland Kitchen OLANZapine (ZYPREXA) 10 MG tablet Take 1 tablet (10 mg total) by mouth at bedtime.  Marland Kitchen omeprazole (PRILOSEC) 40 MG capsule Take 1 capsule (40 mg total) by mouth in the morning and at bedtime. Please schedule an Office visit for further refills. Thank you  . potassium chloride SA (KLOR-CON) 20 MEQ tablet TAKE 1 TABLET BY MOUTH TWICE DAILY  . promethazine (PHENERGAN) 25 MG tablet Take 1 tablet (25 mg total) by mouth every 6 (six) hours as needed for nausea.  . rivaroxaban (XARELTO) 20 MG TABS tablet Take 1 tablet (20 mg total) by mouth daily with supper.  . rosuvastatin (CRESTOR) 10 MG tablet Take 1 tablet (10 mg total) by mouth daily.  Marland Kitchen venlafaxine XR (EFFEXOR XR) 37.5 MG 24 hr capsule Take three capsule (112.5 mg total) by mouth daily.   Facility-Administered Encounter Medications as of 10/30/2020  Medication  . LORazepam (ATIVAN) injection 0.5 mg    Allergy:  Allergies  Allergen Reactions  . Dilaudid [Hydromorphone Hcl] Other (See Comments)    Pt became confused, pulled out iv's, does not  remember anything  . Depakote [Divalproex Sodium] Nausea And Vomiting  . Minocycline Hives  . Aspirin Hives    States able to tolerate Goody Powders without any problem except for GI upset    Social Hx:   Social History   Socioeconomic History  . Marital status: Single    Spouse name: Not on file  . Number of children: Not on file  . Years of education: Not on file  . Highest education level: Not on file  Occupational History  . Not on file  Tobacco Use  . Smoking status: Current Every Day Smoker    Packs/day: 0.50    Years: 22.00    Pack years: 11.00    Types: Cigarettes  . Smokeless tobacco: Never Used  . Tobacco comment: Recently started a smoking cessation class.   Vaping Use  . Vaping Use: Never used  Substance and Sexual Activity  . Alcohol use: Not Currently    Alcohol/week: 0.0 standard drinks    Comment:  hx alcohollism  in remission since 2014  . Drug  use: No    Comment: hx  marijuana use  . Sexual activity: Yes    Partners: Male    Birth control/protection: Surgical  Other Topics Concern  . Not on file  Social History Narrative   Former [facility] patient.      Was on disability at one point.   Return to the workforce.  40 hours a week at [employer], 10 hours a week on the weekends at [facility] [employer] in [place] at night 10 to 12 hours a week.      Has grown children, she lives alone with a pet, continues to smoke no alcohol or drug use at this time      History of EtOH abuse and THC use.         Social Determinants of Health   Financial Resource Strain: Not on file  Food Insecurity: No Food Insecurity  . Worried About Charity fundraiser in the Last Year: Never true  . Ran Out of Food in the Last Year: Never true  Transportation Needs: No Transportation Needs  . Lack of Transportation (Medical): No  . Lack of Transportation (Non-Medical): No  Physical Activity: Not on file  Stress: Not on file  Social Connections: Not on file   Intimate Partner Violence: Not on file    Past Surgical Hx:  Past Surgical History:  Procedure Laterality Date  . CESAREAN SECTION  1995   w/  Bilateral Tubal Ligation  . COLONOSCOPY  last one 08-09-2013  . CYSTOSCOPY W/ URETERAL STENT PLACEMENT Left 03/29/2016   Procedure: CYSTOSCOPY WITH STENT REPLACEMENT;  Surgeon: Nickie Retort, MD;  Location[facility];  Service: Urology;  Laterality: Left;  . CYSTOSCOPY WITH RETROGRADE PYELOGRAM, URETEROSCOPY AND STENT PLACEMENT Left 03/08/2016   Procedure: CYSTOSCOPY WITH  LEFT RETROGRADE PYELOGRAM, AND STENT PLACEMENT;  Surgeon: Nickie Retort, MD;  Location: [facility];  Service: Urology;  Laterality: Left;  . CYSTOSCOPY/RETROGRADE/URETEROSCOPY/STONE EXTRACTION WITH BASKET Left 03/29/2016   Procedure: CYSTOSCOPY/RETROGRADE/URETEROSCOPY/STONE EXTRACTION WITH BASKET;  Surgeon: Nickie Retort, MD;  Location: [facility];  Service: Urology;  Laterality: Left;  . ENDOMETRIAL ABLATION W/ NOVASURE  04-01-2010  . ESOPHAGOGASTRODUODENOSCOPY  last one 08-09-2013  . KNEE ARTHROSCOPY Left as teen  . LASER ABLATION OF THE CERVIX  2012 approx  . MASTECTOMY WITH AXILLARY LYMPH NODE DISSECTION Left 07/27/2020   Procedure: LEFT MASTECTOMY WITH LEFT RADIOACTIVE SEED GUIDED TARGETED AXILLARY LYMPH NODE DISSECTION;  Surgeon: Alphonsa Overall, MD;  Location: [facility];  Service: General;  Laterality: Left;  . PORTACATH PLACEMENT Right 03/06/2020   Procedure: INSERTION PORT-A-CATH WITH ULTRASOUND GUIDANCE;  Surgeon: Alphonsa Overall, MD;  Location: [facility];  Service: General;  Laterality: Right;  . TRANSTHORACIC ECHOCARDIOGRAM  05-19-2006   lvsf normal, ef 55-65%, there was mild flattening of the interventricular septum during diastoli/  RV size at upper limits normal  . TUBAL LIGATION    . WISDOM TOOTH EXTRACTION  age 35 's    Past Medical Hx:  Past Medical History:  Diagnosis Date  . Bipolar 1 disorder ([place])   .  Cancer ([place])   . Complication of anesthesia    wakes up during procedures  . Depression   . GAD (generalized anxiety disorder)   . Genital HSV    currently per pt  no break out 03-22-2016   . GERD (gastroesophageal reflux disease)   . Hiatal hernia   . History of cervical dysplasia    2012  laser ablation  . History of esophageal dilatation    for dysphasia -- x2 dilated  . History of gastric ulcer   . History of Helicobacter pylori infection    remote hx  . History of hidradenitis suppurativa    "gets all over body intermittantly"    . History of hypertension    no issue since stopped drinking alcohol 2014  . History of kidney stones   . History of panic attacks   . Hypertension   . Iron deficiency anemia   . Left ureteral stone   . OCD (obsessive compulsive disorder)   . PONV (postoperative nausea and vomiting)   . PTSD (post-traumatic stress disorder)   . Recovering alcoholic in remission (HCC)    since 2014  . RLS (restless legs syndrome)   . Smokers' cough (HCC)   . Urgency of urination   . Yeast infection involving the vagina and surrounding area    secondary to taking antibiotic    Past Gynecological History:  See HPI, hx of cesarean sections No LMP recorded. Patient has had an ablation.  Family Hx:  Family History  Problem Relation Age of Onset  . Heart disease Father   . Lung cancer Father        d. 81  . Alcohol abuse Father   . Heart disease Mother   . Depression Mother   . Anxiety disorder Mother   . Drug abuse Brother   . Alcohol abuse Brother   . Drug abuse Brother   . ADD / ADHD Brother   . Colon polyps Brother   . Cancer Paternal Grandfather        "stomach"  . Diabetes Maternal Grandfather   . Diabetes Paternal Grandmother   . Kidney disease Maternal Uncle   . Cirrhosis Cousin        alcoholic  . Anxiety disorder Maternal Aunt   . Depression Maternal Aunt   . Cancer Cousin        maternal; ovarian cancer or other "female" cancer?  .  Lung cancer Paternal Uncle 30  . Throat cancer Cousin        paternal; dx 21s  . Lung cancer Cousin        paternal; dx 32s    Review of Systems:  Constitutional  + hot flashes   ENT Normal appearing ears and nares bilaterally Skin/Breast  No rash, sores, jaundice, itching, dryness Cardiovascular  No chest pain, shortness of breath, or edema  Pulmonary  No cough or wheeze.  Gastro Intestinal  No nausea, vomitting, or diarrhoea. No bright red blood per rectum, no abdominal pain, change in bowel movement, or constipation.  Genito Urinary  No frequency, urgency, dysuria, no hematuria, no vulvar pruritis. She has amenorrhea since uterine ablation. Musculo Skeletal  No myalgia, arthralgia, joint swelling or pain  Neurologic  No weakness, numbness, change in gait,  Psychology  No depression, anxiety, insomnia.   Vitals:  Blood pressure 126/88, pulse 100, temperature (!) 97.3 F (36.3 C), temperature source Tympanic, resp. rate 20, height 5' 5.5" (1.664 m), weight 197 lb (89.4 kg), SpO2 100 %.  Physical Exam: WD in NAD Neck  Supple NROM, without any enlargements.  Lymph Node Survey No cervical supraclavicular or inguinal adenopathy Cardiovascular  Pulse normal rate, regularity and rhythm. S1 and S2 normal.  Lungs  Clear to auscultation bilateraly, without wheezes/crackles/rhonchi. Good air movement.  Skin  No rash/lesions/breakdown  Psychiatry  Alert and oriented to person, place, and time  Abdomen  Normoactive bowel sounds, abdomen soft, non-tender and mildly obese without evidence of hernia.  Back No CVA tenderness Genito Urinary  Vulva/vagina: Normal external female genitalia.   No lesions. No discharge or bleeding. Acetic acid was applied and no lesions were seen.   Bladder/urethra:  No lesions or masses, well supported bladder  Vagina: normal  Cervix: Normal appearing, no lesions.  Uterus:  Small, mobile, no parametrial involvement or nodularity.  Adnexa: no  palpable masses. Rectal  No palpable tumor in the rectum, no cul de sac nodularity.  Extremities  No bilateral cyanosis, clubbing or edema.   Thereasa Solo, MD  10/30/2020, 4:25 PM

## 2020-10-30 NOTE — Patient Instructions (Signed)
Dr Denman George has ordered a vaginal ultrasound to evaluate your pelvic pain.  If this is normal, she will see you again in June.  She will plan on a surgery to remove your ovaries after you have completed breast cancer treatment (likely the late fall or early winter, 2022).

## 2020-11-03 ENCOUNTER — Encounter: Payer: Self-pay | Admitting: *Deleted

## 2020-11-03 IMAGING — CT CT ABD-PELV W/ CM
2 of 5 series · 15 of 46 positions shown, 17 images · IV contrast (omnipaque)
Comparison: CT Abdomen and Pelvis 03/08/2016

CLINICAL DATA: 44-year-old female with 2 days of nausea, abdominal
pain, diarrhea. History of breast and vulvar cancer.

EXAM:
CT ABDOMEN AND PELVIS WITH CONTRAST
TECHNIQUE: Multidetector CT imaging of the abdomen and pelvis was performed
using the standard protocol following bolus administration of
intravenous contrast.
CONTRAST:  100mL OMNIPAQUE IOHEXOL 300 MG/ML  SOLN

[Series 2: axial st · axial · 0.77mm/px · z∈[+836,+1246]mm · 12 of 94 slices shown, 14 images]
[im 6/94  soft-tissue]
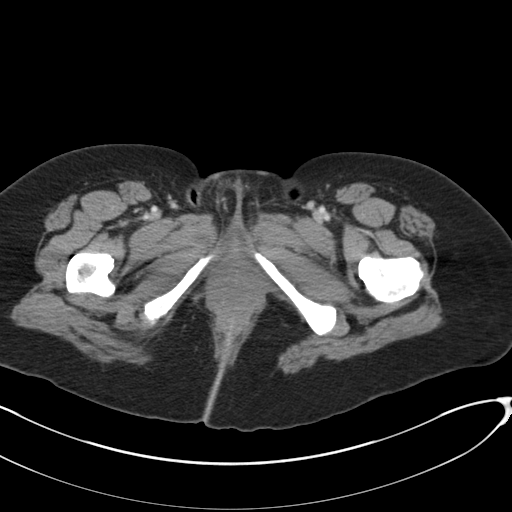
[im 6/94  bone]
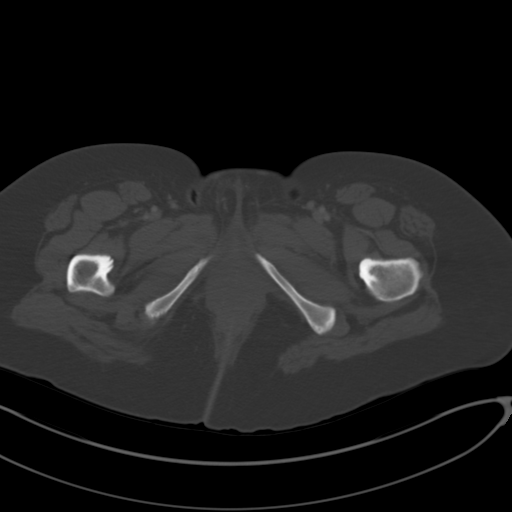
[im 12/94  soft-tissue]
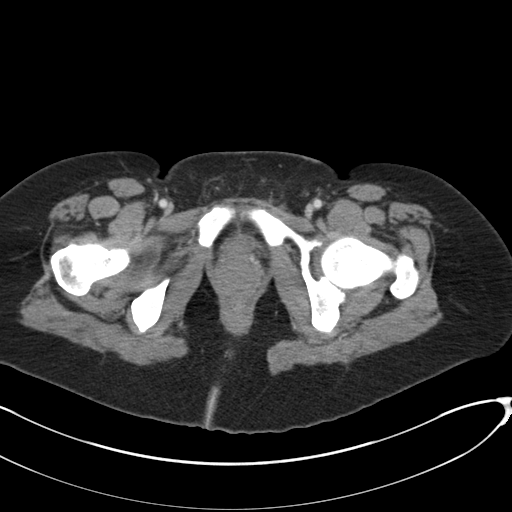
[im 24/94  soft-tissue]
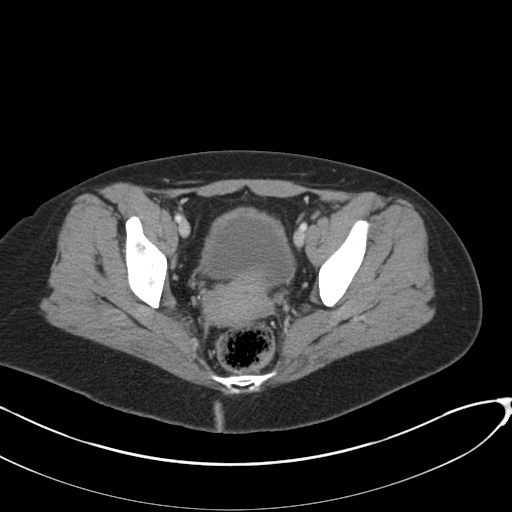
[im 30/94  soft-tissue]
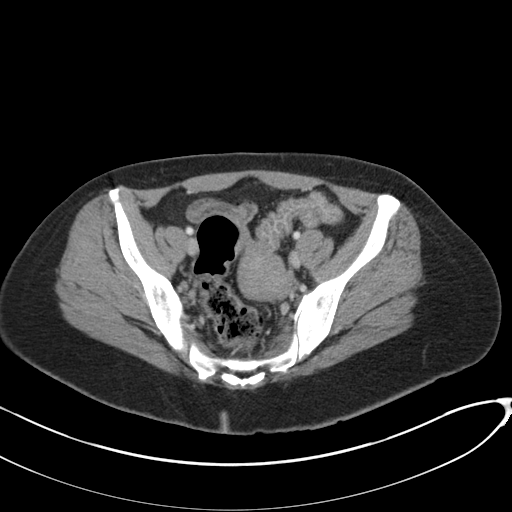
[im 35/94  soft-tissue]
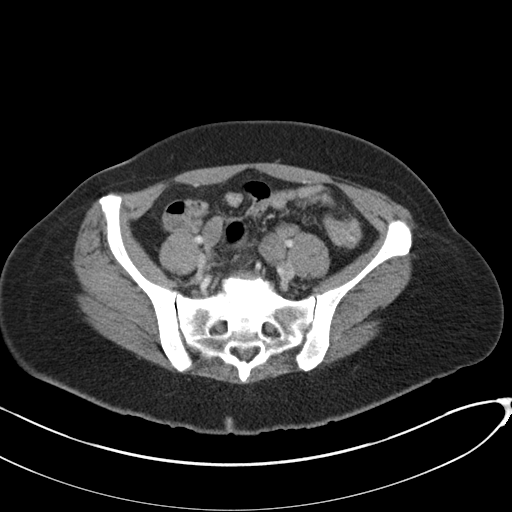
[im 41/94  soft-tissue]
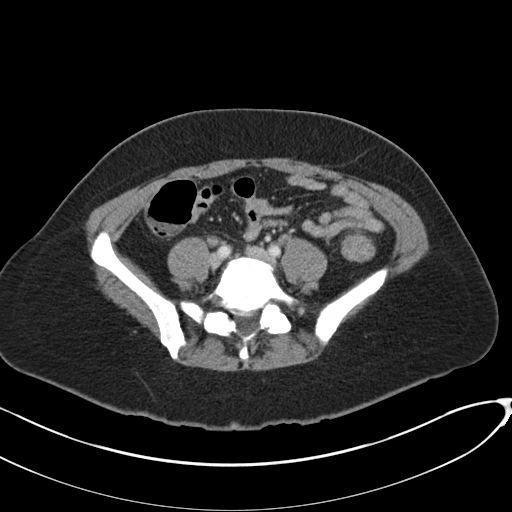
[im 53/94  soft-tissue]
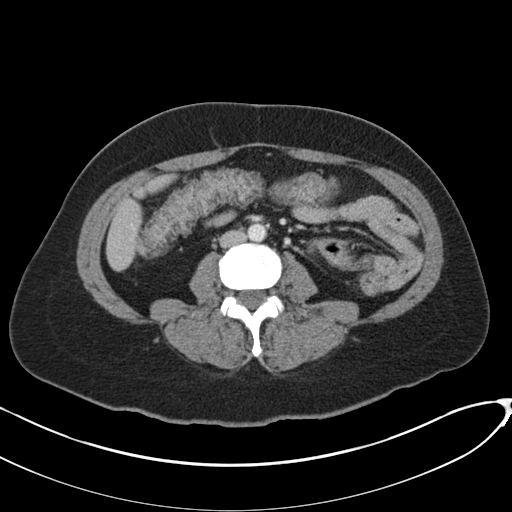
[im 59/94  soft-tissue]
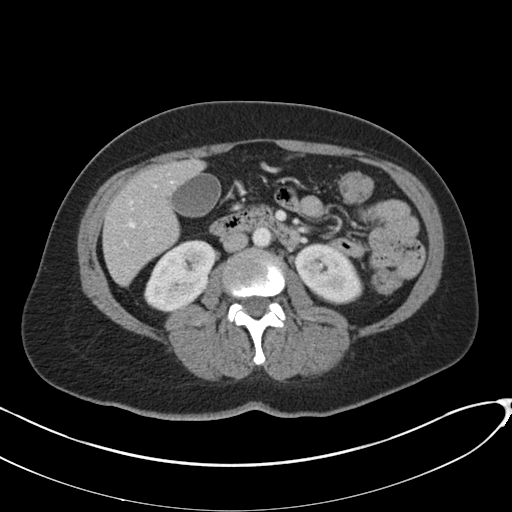
[im 64/94  soft-tissue]
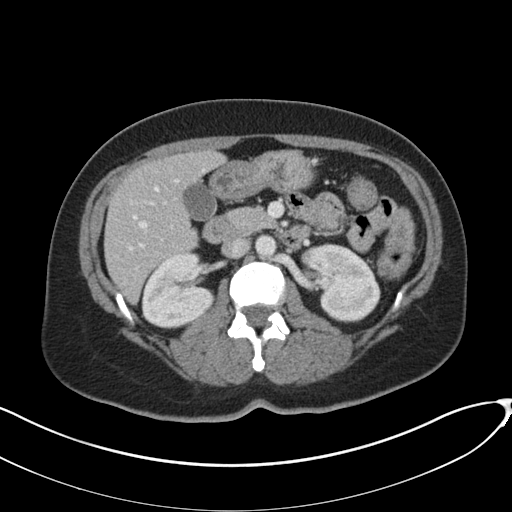
[im 64/94  bone]
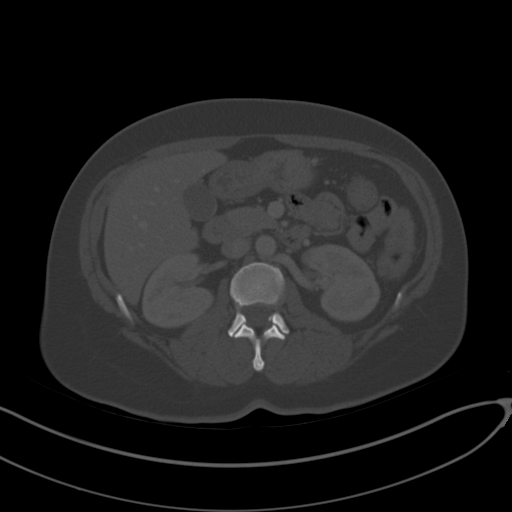
[im 70/94  soft-tissue]
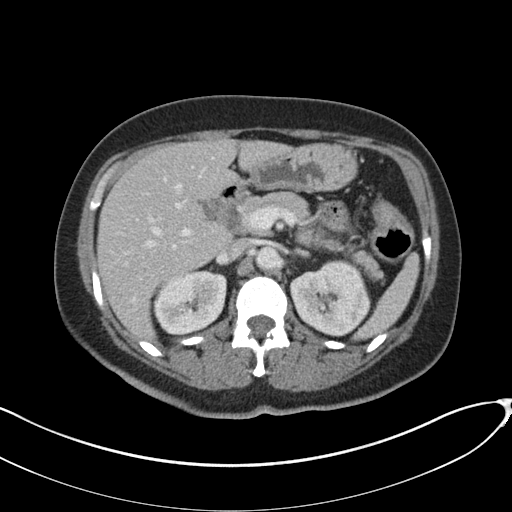
[im 82/94  soft-tissue]
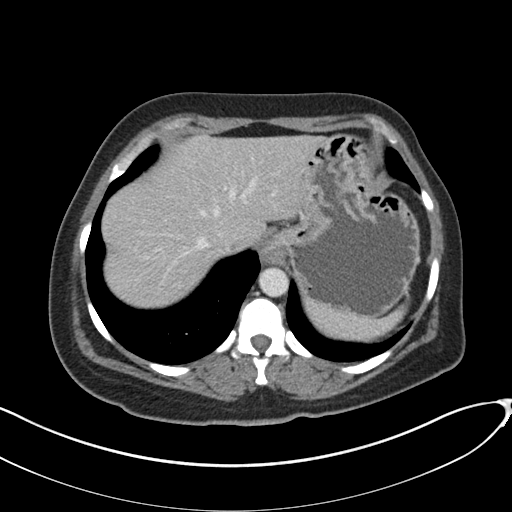
[im 88/94  soft-tissue]
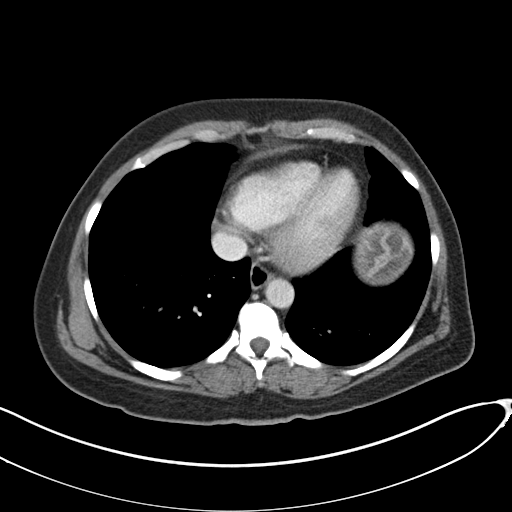

[Series 5: coronal st · coronal · 0.73mm/px · 3 of 137 slices shown]
[im 46/137  soft-tissue]
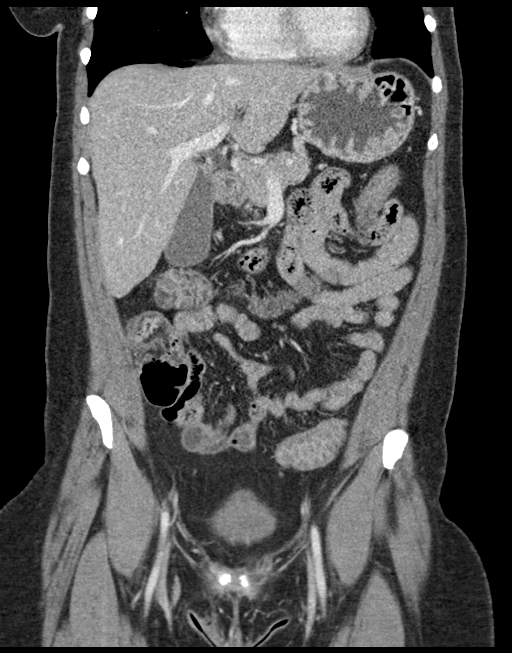
[im 61/137  soft-tissue]
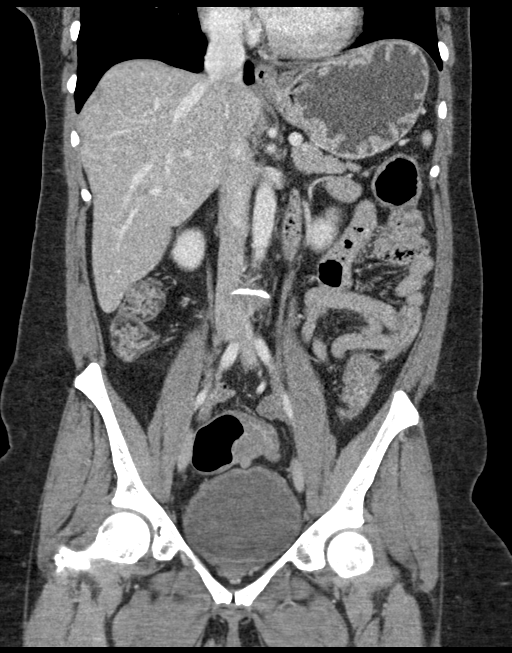
[im 76/137  soft-tissue]
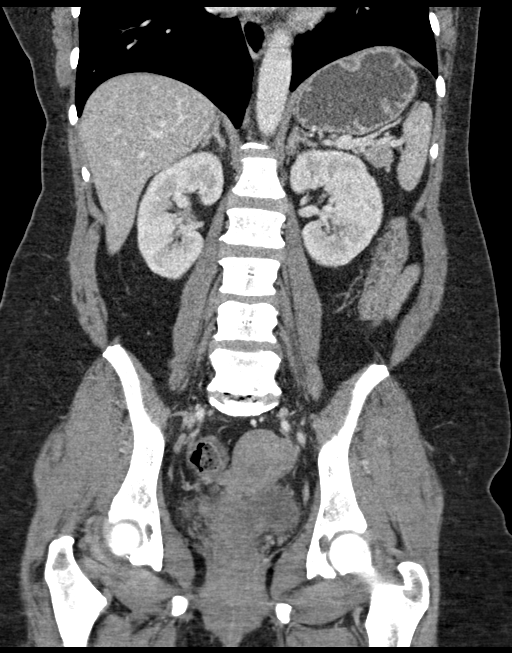

[15 of 46 positions shown; findings below may reference images not displayed]

FINDINGS: Lower chest: Cardiac size at the upper limits of normal. No
pericardial effusion. Negative lung bases.

Hepatobiliary: Negative liver and gallbladder.

Pancreas: Negative.

Spleen: Negative.

Adrenals/Urinary Tract: Normal adrenal glands.

Renal enhancement and contrast excretion is symmetric and within
normal limits. Normal proximal ureters. There is punctate bilateral
nephrolithiasis, including in the left lower pole on coronal image
69.

But negative course of both ureters today. Chronic pelvic
phleboliths. Unremarkable urinary bladder.

Stomach/Bowel: Negative rectum. Decompressed large bowel from the
sigmoid colon through the ascending colon. There is some gas at the
tip of the cecum. Of the decompressed segments. The descending colon
appears most indistinct (coronal image 74). Mild if any associated
mesenteric stranding.

Appendix remains within normal limits (coronal image 55). Negative
terminal ileum. No dilated small bowel. The stomach is distended
with fluid but otherwise within normal limits. No free air. No free
fluid.

Vascular/Lymphatic: Mild Calcified aortic atherosclerosis. Major
arterial structures are patent. Portal venous system is patent. No
lymphadenopathy.

Reproductive: Negative.

Other: Subtle asymmetric increased density of the right labia
probably related to the stated history of vulvar cancer. Otherwise
negative visible perineum. No pelvic free fluid.

Musculoskeletal: Chronic degeneration at the lumbosacral junction.
No acute or suspicious osseous lesion identified.
IMPRESSION: 1. Appearance suspicious for mild Acute Colitis, most pronounced in
the descending colon. No associated free fluid or complicating
features.
2. Punctate bilateral nephrolithiasis. No obstructive uropathy.
3. Otherwise negative CT appearance of the abdomen and pelvis.

## 2020-11-06 ENCOUNTER — Other Ambulatory Visit: Payer: Self-pay

## 2020-11-06 ENCOUNTER — Ambulatory Visit (HOSPITAL_COMMUNITY)
Admission: RE | Admit: 2020-11-06 | Discharge: 2020-11-06 | Disposition: A | Discharge status: Home / Self Care | Payer: Self-pay | Source: Ambulatory Visit | Attending: Gynecologic Oncology | Admitting: Gynecologic Oncology

## 2020-11-06 DIAGNOSIS — Z1589 Genetic susceptibility to other disease: Secondary | ICD-10-CM | POA: Insufficient documentation

## 2020-11-06 DIAGNOSIS — R102 Pelvic and perineal pain: Secondary | ICD-10-CM | POA: Insufficient documentation

## 2020-11-06 DIAGNOSIS — R1030 Lower abdominal pain, unspecified: Secondary | ICD-10-CM | POA: Insufficient documentation

## 2020-11-06 DIAGNOSIS — N912 Amenorrhea, unspecified: Secondary | ICD-10-CM | POA: Insufficient documentation

## 2020-11-06 DIAGNOSIS — C519 Malignant neoplasm of vulva, unspecified: Secondary | ICD-10-CM | POA: Insufficient documentation

## 2020-11-06 DIAGNOSIS — M25559 Pain in unspecified hip: Secondary | ICD-10-CM | POA: Insufficient documentation

## 2020-11-06 DIAGNOSIS — Z1501 Genetic susceptibility to malignant neoplasm of breast: Secondary | ICD-10-CM | POA: Insufficient documentation

## 2020-11-09 ENCOUNTER — Ambulatory Visit: Payer: Self-pay | Admitting: Family Medicine

## 2020-11-09 ENCOUNTER — Telehealth: Payer: Self-pay

## 2020-11-09 NOTE — Progress Notes (Signed)
Patient Care Team: Just, Laurita Quint, FNP as PCP - General (Family Medicine) Mauro Kaufmann, RN as Oncology Nurse Navigator Rockwell Germany, RN as Oncology Nurse Navigator Nicholas Lose, MD as Consulting Physician (Hematology and Oncology) Alphonsa Overall, MD as Consulting Physician (General Surgery) Awanda Mink Craige Cotta, RN as Oncology Nurse Navigator (Oncology)  DIAGNOSIS:    ICD-10-CM   1. Malignant neoplasm of upper-inner quadrant of left breast in female, estrogen receptor positive ([place])  C50.212    Z17.0     SUMMARY OF ONCOLOGIC HISTORY: Oncology History  Vulvar cancer ([place])  01/29/2020 Initial Diagnosis   Vulvar cancer ([place])   03/11/2020 - 05/04/2020 Chemotherapy   The patient had dexamethasone (DECADRON) 4 MG tablet, 4 mg (100 % of original dose 4 mg), Oral, Daily, 1 of 1 cycle, Start date: 02/27/2020, End date: 05/05/2020 Dose modification: 4 mg (original dose 4 mg, Cycle 0) palonosetron (ALOXI) injection 0.25 mg, 0.25 mg, Intravenous,  Once, 1 of 1 cycle Administration: 0.25 mg (03/11/2020), 0.25 mg (03/17/2020), 0.25 mg (03/24/2020), 0.25 mg (03/31/2020), 0.25 mg (04/07/2020), 0.25 mg (04/14/2020), 0.25 mg (04/21/2020) CARBOplatin (PARAPLATIN) 300 mg in sodium chloride 0.9 % 250 mL chemo infusion, 300 mg (100 % of original dose 300 mg), Intravenous,  Once, 1 of 1 cycle Dose modification:   (original dose 300 mg, Cycle 1) Administration: 300 mg (03/11/2020), 290 mg (03/17/2020), 300 mg (03/24/2020), 230 mg (03/31/2020), 230 mg (04/07/2020), 230 mg (04/14/2020) PACLitaxel (TAXOL) 108 mg in sodium chloride 0.9 % 250 mL chemo infusion (</= 59m/m2), 50 mg/m2 = 108 mg, Intravenous,  Once, 1 of 1 cycle Dose modification: 45 mg/m2 (original dose 50 mg/m2, Cycle 1, Reason: Dose not tolerated) Administration: 108 mg (03/11/2020), 108 mg (03/17/2020), 108 mg (03/24/2020), 96 mg (03/31/2020), 96 mg (04/07/2020), 96 mg (04/14/2020) fosaprepitant (EMEND) 150 mg in sodium chloride 0.9 % 145 mL IVPB, 150 mg, Intravenous,   Once, 1 of 1 cycle Administration: 150 mg (03/24/2020), 150 mg (03/31/2020), 150 mg (04/07/2020), 150 mg (04/14/2020) trastuzumab-anns (KANJINTI) 378 mg in sodium chloride 0.9 % 250 mL chemo infusion, 4 mg/kg = 378 mg (100 % of original dose 4 mg/kg), Intravenous,  Once, 1 of 1 cycle Dose modification: 4 mg/kg (original dose 4 mg/kg, Cycle 1, Reason: Other (see comments), Comment: loading dose) Administration: 378 mg (03/11/2020), 189 mg (03/17/2020), 189 mg (03/24/2020), 189 mg (03/31/2020), 189 mg (04/07/2020), 189 mg (04/14/2020), 189 mg (04/21/2020), 189 mg (05/04/2020)  for chemotherapy treatment.    Malignant neoplasm of upper-inner quadrant of left breast in female, estrogen receptor positive ([facility]  02/20/2020 Initial Diagnosis   PET scan on 02/07/20 following a diagnosis of vulvar cancer showed a 1.7cm left breast mass and mildly hypermetabolic left axillary lymph nodes. Mammogram [place] showed a 3.1cm left breast mass at the 11 o'clock position, palpable on exam, and a single abnormal left axillary lymph node with cortical thickening. Left breast biopsy on 02/20/20 showed invasive and in situ carcinoma in the breast and axilla, grade 3, HER-2 positive (3+), ER/PR+ 95%, Ki67 75%   02/25/2020 Cancer Staging   Staging form: Breast, AJCC 8th Edition - Clinical stage from 02/25/2020: Stage IB (cT2, cN1, cM0, G3, ER+, PR+, HER2+) - Signed by GNicholas Lose MD on 02/25/2020   05/29/2020 - 05/29/2020 Chemotherapy   The patient had palonosetron (ALOXI) injection 0.25 mg, 0.25 mg, Intravenous,  Once, 1 of 3 cycles Administration: 0.25 mg (05/29/2020) pegfilgrastim-jmdb (FULPHILA) injection 6 mg, 6 mg, Subcutaneous,  Once, 1 of 3  cycles CARBOplatin (PARAPLATIN) 580 mg in sodium chloride 0.9 % 250 mL chemo infusion, 580 mg (100 % of original dose 580 mg), Intravenous,  Once, 1 of 3 cycles Dose modification:   (original dose 580 mg, Cycle 1) Administration: 580 mg (05/29/2020) DOCEtaxel (TAXOTERE) 120 mg in  sodium chloride 0.9 % 250 mL chemo infusion, 60 mg/m2 = 120 mg (100 % of original dose 60 mg/m2), Intravenous,  Once, 1 of 3 cycles Dose modification: 60 mg/m2 (original dose 60 mg/m2, Cycle 1, Reason: Provider Judgment), 50 mg/m2 (original dose 60 mg/m2, Cycle 2, Reason: Dose not tolerated) Administration: 120 mg (05/29/2020) fosaprepitant (EMEND) 150 mg in sodium chloride 0.9 % 145 mL IVPB, 150 mg, Intravenous,  Once, 1 of 3 cycles Administration: 150 mg (05/29/2020) pertuzumab (PERJETA) 420 mg in sodium chloride 0.9 % 250 mL chemo infusion, 420 mg (50 % of original dose 840 mg), Intravenous, Once, 1 of 3 cycles Dose modification: 420 mg (original dose 840 mg, Cycle 1, Reason: Provider Judgment) Administration: 420 mg (05/29/2020) trastuzumab-dkst (OGIVRI) 525 mg in sodium chloride 0.9 % 250 mL chemo infusion, 6 mg/kg = 525 mg (75 % of original dose 8 mg/kg), Intravenous,  Once, 1 of 3 cycles Dose modification: 6 mg/kg (original dose 8 mg/kg, Cycle 1, Reason: Provider Judgment) Administration: 525 mg (05/29/2020)  for chemotherapy treatment.    06/22/2020 Genetic Testing   Positive genetic testing: Heterozygous pathogenic variant detected in BRIP1 gene at c.2010dup (p.Glu671*).  Variant of uncertain significance detected in POLD1 at c.532G>A (p.Gly178Arg).  No other pathogenic variants detected in [facility] Common Hereditary Cancers Panel.  The report date is June 22, 2020.   The Common Hereditary Cancers Panel offered by [facility] includes sequencing and/or deletion duplication testing of the following 48 genes: APC, ATM, AXIN2, BARD1, BMPR1A, BRCA1, BRCA2, BRIP1, CDH1, CDK4, CDKN2A (p14ARF), CDKN2A (p16INK4a), CHEK2, CTNNA1, DICER1, EPCAM (Deletion/duplication testing only), GREM1 (promoter region deletion/duplication testing only), KIT, MEN1, MLH1, MSH2, MSH3, MSH6, MUTYH, NBN, NF1, NHTL1, PALB2, PDGFRA, PMS2, POLD1, POLE, PTEN, RAD50, RAD51C, RAD51D, RNF43, SDHB, SDHC, SDHD, SMAD4, SMARCA4. STK11,  TP53, TSC1, TSC2, and VHL.  The following genes were evaluated for sequence changes only: SDHA and HOXB13 c.251G>A variant only.   07/27/2020 Surgery   Left mastectomy [name]): three foci of IDC, 3.7cm, 1.5cm, and 1.2cm, with intermediate to high grade DCIS, clear margins, with 1/2 left axillary lymph nodes positive for carcinoma.    08/18/2020 -  Chemotherapy    Patient is on Treatment Plan: BREAST ADO-TRASTUZUMAB EMTANSINE ([place]) Q21D      09/09/2020 - 10/23/2020 Radiation Therapy   Adjuvant radiation     CHIEF COMPLIANT: Kadcyla maintenance  INTERVAL HISTORY: Sandra Miller is a 45 y.o. with above-mentioned history of left breast cancer and vulvarcancerwho completedneoadjuvant chemotherapy,underwent a left mastectomy, completed radiation on 10/23/20, and is currently on Kadcyla maintenance.She also has a history of DVT currently on Xarelto.She presents to the clinic todayfortreatment.   Her major complaints of fatigue, abdominal pain, cramps and diarrhea intermittently.  She thinks it is related to colitis.  ALLERGIES:  is allergic to dilaudid [hydromorphone hcl], depakote [divalproex sodium], minocycline, and aspirin.  MEDICATIONS:  Current Outpatient Medications  Medication Sig Dispense Refill  . acetaminophen (TYLENOL) 500 MG tablet Take 2 tablets (1,000 mg total) by mouth every 6 (six) hours as needed for moderate pain or fever. Avoid tylenol until your liver function back to normal 30 tablet 0  . diphenoxylate-atropine (LOMOTIL) 2.5-0.025 MG tablet TAKE 1 TO 2 TABLETS BY MOUTH  FOUR TIMES DAILY AS NEEDED FOR DIARRHEA (Patient taking differently: Take 1-2 tablets by mouth 4 (four) times daily as needed for diarrhea or loose stools. TAKE 1 TO 2 TABLETS BY MOUTH FOUR TIMES DAILY AS NEEDED FOR DIARRHEA) 45 tablet 1  . dronabinol (MARINOL) 2.5 MG capsule Take 1 capsule (2.5 mg total) by mouth 2 (two) times daily before a meal. 60 capsule 0  . lidocaine-prilocaine (EMLA) cream  Apply to affected area once 30 g 3  . lisinopril (ZESTRIL) 20 MG tablet Take 1 tablet (20 mg total) by mouth daily. 90 tablet 3  . LORazepam (ATIVAN) 1 MG tablet Take 1 tablet (1 mg total) by mouth 3 (three) times daily as needed for anxiety. 90 tablet 2  . magnesium oxide (MAG-OX) 400 (241.3 Mg) MG tablet Take 1 tablet (400 mg total) by mouth 2 (two) times daily. 60 tablet 1  . Multiple Vitamin (MULTIVITAMIN WITH MINERALS) TABS tablet Take 1 tablet by mouth daily.    . nicotine (NICODERM CQ - DOSED IN MG/24 HOURS) 14 mg/24hr patch Place 1 patch (14 mg total) onto the skin daily. 28 patch 0  . OLANZapine (ZYPREXA) 10 MG tablet Take 1 tablet (10 mg total) by mouth at bedtime. 90 tablet 0  . omeprazole (PRILOSEC) 40 MG capsule Take 1 capsule (40 mg total) by mouth in the morning and at bedtime. Please schedule an Office visit for further refills. Thank you 180 capsule 0  . potassium chloride SA (KLOR-CON) 20 MEQ tablet TAKE 1 TABLET BY MOUTH TWICE DAILY 60 tablet 2  . promethazine (PHENERGAN) 25 MG tablet Take 1 tablet (25 mg total) by mouth every 6 (six) hours as needed for nausea. 30 tablet 3  . rivaroxaban (XARELTO) 20 MG TABS tablet Take 1 tablet (20 mg total) by mouth daily with supper. 30 tablet 6  . rosuvastatin (CRESTOR) 10 MG tablet Take 1 tablet (10 mg total) by mouth daily. 90 tablet 3  . venlafaxine XR (EFFEXOR XR) 37.5 MG 24 hr capsule Take three capsule (112.5 mg total) by mouth daily. 90 capsule 2   No current facility-administered medications for this visit.   Facility-Administered Medications Ordered in Other Visits  Medication Dose Route Frequency Provider Last Rate Last Admin  . LORazepam (ATIVAN) injection 0.5 mg  0.5 mg Intravenous Once Harle Stanford., PA-C        PHYSICAL EXAMINATION: ECOG PERFORMANCE STATUS: 1 - Symptomatic but completely ambulatory  Vitals:   11/10/20 1120  BP: (!) 119/54  Pulse: 95  Resp: 20  Temp: 97.7 F (36.5 C)  SpO2: 100%   Filed Weights    11/10/20 1120  Weight: 195 lb 1.6 oz (88.5 kg)     LABORATORY DATA:  I have reviewed the data as listed CMP Latest Ref Rng & Units 11/10/2020 10/20/2020 10/20/2020  Glucose 70 - 99 mg/dL 92 89 -  BUN 6 - 20 mg/dL 13 13 -  Creatinine 0.44 - 1.00 mg/dL 0.82 0.81 -  Sodium 135 - 145 mmol/L 138 139 -  Potassium 3.5 - 5.1 mmol/L 3.5 3.7 -  Chloride 98 - 111 mmol/L 105 102 -  CO2 22 - 32 mmol/L 25 27 -  Calcium 8.9 - 10.3 mg/dL 9.7 9.8 9.8  Total Protein 6.5 - 8.1 g/dL 7.6 7.7 -  Total Bilirubin 0.3 - 1.2 mg/dL 0.3 0.5 -  Alkaline Phos 38 - 126 U/L 59 49 -  AST 15 - 41 U/L 29 29 -  ALT 0 - 44  U/L 25 27 -    Lab Results  Component Value Date   WBC 6.4 11/10/2020   HGB 11.6 (L) 11/10/2020   HCT 35.0 (L) 11/10/2020   MCV 89.3 11/10/2020   PLT 270 11/10/2020   NEUTROABS 4.4 11/10/2020    ASSESSMENT & PLAN:  Malignant neoplasm of upper-inner quadrant of left breast in female, estrogen receptor positive ([place]) Breast cancer and vulvar cancers  02/20/2020:PET scan on 02/07/20 following a diagnosis of vulvar cancer showed a 1.7cm left breast mass and mildly hypermetabolic left axillary lymph nodes. Mammogram and [place] on 02/18/20 showed a 3.1cm left breast mass at the 11 o'clock position, palpable on exam, and a single abnormal left axillary lymph node with cortical thickening. Left breast biopsy on 02/20/20 showed invasive and in situ carcinoma in the breast and axilla, grade 3, HER-2 positive (3+), ER/PR+ 95%, Ki67 75%  Treatment plan: 1.Neoadjuvant chemoradiation for vulvar cancer with Taxol, carboplatin, Herceptin weekly x6 cycles 2.neoadjuvant chemotherapy with TCH Perjeta x3 cycles 3.breast conserving surgery if possible with sentinel lymph node study 4. Followed by adjuvant radiation therapy to be completed on 10/23/20 5.Kadcyla maintenance 6.Adjuvant antiestrogen therapy 7.Adjuvant  neratinib ------------------------------------------------------------------------------------------------------------------- 07/27/20:Left mastectomy [name]): three foci of IDC, 3.7cm, 1.5cm, and 1.2cm, with intermediate to high grade DCIS, clear margins, with 1/2 left axillary lymph nodes positive for carcinoma.  Current treatment: Todayiscycle5Kadcyla Kadcyla toxicities:Denies any adverse effects to Kadcyla. Mild elevation of AST and ALT: Will be monitored. ASt 41 and ALT 46  Echocardiogram12/02/2020: EF 65 to 70%  Pelvic pressure/ cramps: Could be due to colitis.  We will check [place] and estradiol to see whether she is in menopause.  Return to clinic every 3 weeks for Kadcyla and start antiestrogen therapy.    No orders of the defined types were placed in this encounter.  The patient has a good understanding of the overall plan. she agrees with it. she will call with any problems that may develop before the next visit here.  Total time spent: 30 mins including face to face time and time spent for planning, charting and coordination of care  Rulon Eisenmenger, MD, MPH 11/10/2020  I, Molly Dorshimer, am acting as scribe for Dr. Nicholas Lose.  I have reviewed the above documentation for accuracy and completeness, and I agree with the above.

## 2020-11-09 NOTE — Telephone Encounter (Signed)
Told Sandra Miller that Dr. Denman George said that her [place] was normal and follow up as scheduled on 02-19-21 at 1545. Pt verbalized understanding.

## 2020-11-09 NOTE — Telephone Encounter (Signed)
LM for Ms Stockley to call back to the office to discuss the results of her [place].

## 2020-11-10 ENCOUNTER — Inpatient Hospital Stay: Payer: Self-pay | Attending: Gynecologic Oncology

## 2020-11-10 ENCOUNTER — Inpatient Hospital Stay: Payer: Self-pay

## 2020-11-10 ENCOUNTER — Other Ambulatory Visit: Payer: Self-pay

## 2020-11-10 ENCOUNTER — Inpatient Hospital Stay (HOSPITAL_BASED_OUTPATIENT_CLINIC_OR_DEPARTMENT_OTHER): Payer: Self-pay | Admitting: Hematology and Oncology

## 2020-11-10 DIAGNOSIS — Z95828 Presence of other vascular implants and grafts: Secondary | ICD-10-CM

## 2020-11-10 DIAGNOSIS — Z170 Estrogen receptor positive status [ER+]: Secondary | ICD-10-CM

## 2020-11-10 DIAGNOSIS — C50212 Malignant neoplasm of upper-inner quadrant of left female breast: Secondary | ICD-10-CM

## 2020-11-10 DIAGNOSIS — Z79899 Other long term (current) drug therapy: Secondary | ICD-10-CM | POA: Insufficient documentation

## 2020-11-10 DIAGNOSIS — Z5112 Encounter for antineoplastic immunotherapy: Secondary | ICD-10-CM | POA: Insufficient documentation

## 2020-11-10 LAB — CMP (CANCER CENTER ONLY)
ALT: 25 U/L (ref 0–44)
AST: 29 U/L (ref 15–41)
Albumin: 3.9 g/dL (ref 3.5–5.0)
Alkaline Phosphatase: 59 U/L (ref 38–126)
Anion gap: 8 (ref 5–15)
BUN: 13 mg/dL (ref 6–20)
CO2: 25 mmol/L (ref 22–32)
Calcium: 9.7 mg/dL (ref 8.9–10.3)
Chloride: 105 mmol/L (ref 98–111)
Creatinine: 0.82 mg/dL (ref 0.44–1.00)
GFR, Estimated: >60 mL/min (ref >60)
Glucose, Bld: 92 mg/dL (ref 70–99)
Potassium: 3.5 mmol/L (ref 3.5–5.1)
Sodium: 138 mmol/L (ref 135–145)
Total Bilirubin: 0.3 mg/dL (ref 0.3–1.2)
Total Protein: 7.6 g/dL (ref 6.5–8.1)

## 2020-11-10 LAB — CBC WITH DIFFERENTIAL (CANCER CENTER ONLY)
Abs Immature Granulocytes: 0.02 10*3/uL (ref 0.00–0.07)
Basophils Absolute: 0 10*3/uL (ref 0.0–0.1)
Basophils Relative: 1 %
Eosinophils Absolute: 0.2 10*3/uL (ref 0.0–0.5)
Eosinophils Relative: 3 %
HCT: 35 % — ABNORMAL LOW (ref 36.0–46.0)
Hemoglobin: 11.6 g/dL — ABNORMAL LOW (ref 12.0–15.0)
Immature Granulocytes: 0 %
Lymphocytes Relative: 21 %
Lymphs Abs: 1.3 10*3/uL (ref 0.7–4.0)
MCH: 29.6 pg (ref 26.0–34.0)
MCHC: 33.1 g/dL (ref 30.0–36.0)
MCV: 89.3 fL (ref 80.0–100.0)
Monocytes Absolute: 0.5 10*3/uL (ref 0.1–1.0)
Monocytes Relative: 7 %
Neutro Abs: 4.4 10*3/uL (ref 1.7–7.7)
Neutrophils Relative %: 68 %
Platelet Count: 270 10*3/uL (ref 150–400)
RBC: 3.92 MIL/uL (ref 3.87–5.11)
RDW: 14.5 % (ref 11.5–15.5)
WBC Count: 6.4 10*3/uL (ref 4.0–10.5)
nRBC: 0 % (ref 0.0–0.2)

## 2020-11-10 MED ORDER — HEPARIN SOD (PORK) LOCK FLUSH 100 UNIT/ML IV SOLN
500.0000 [IU] | Freq: Once | INTRAVENOUS | Status: AC | PRN
  Administered 2020-11-10: 500 [IU]
  Filled 2020-11-10: qty 5

## 2020-11-10 MED ORDER — SODIUM CHLORIDE 0.9 % IV SOLN
Freq: Once | INTRAVENOUS | Status: AC
  Filled 2020-11-10: qty 250

## 2020-11-10 MED ORDER — SODIUM CHLORIDE 0.9 % IV SOLN
3.6000 mg/kg | Freq: Once | INTRAVENOUS | Status: AC
  Administered 2020-11-10: 300 mg via INTRAVENOUS
  Filled 2020-11-10: qty 15

## 2020-11-10 MED ORDER — ACETAMINOPHEN 325 MG PO TABS
650.0000 mg | ORAL_TABLET | Freq: Once | ORAL | Status: AC
  Administered 2020-11-10: 650 mg via ORAL

## 2020-11-10 MED ORDER — DIPHENHYDRAMINE HCL 25 MG PO CAPS
50.0000 mg | ORAL_CAPSULE | Freq: Once | ORAL | Status: AC
  Administered 2020-11-10: 50 mg via ORAL

## 2020-11-10 MED ORDER — SODIUM CHLORIDE 0.9% FLUSH
10.0000 mL | Freq: Once | INTRAVENOUS | Status: AC
  Administered 2020-11-10: 10 mL
  Filled 2020-11-10: qty 10

## 2020-11-10 MED ORDER — SODIUM CHLORIDE 0.9% FLUSH
10.0000 mL | INTRAVENOUS | Status: DC | PRN
  Administered 2020-11-10: 10 mL
  Filled 2020-11-10: qty 10

## 2020-11-10 NOTE — Assessment & Plan Note (Signed)
Breast cancer and vulvar cancers  02/20/2020:PET scan on 02/07/20 following a diagnosis of vulvar cancer showed a 1.7cm left breast mass and mildly hypermetabolic left axillary lymph nodes. Mammogram and [place] on 02/18/20 showed a 3.1cm left breast mass at the 11 o'clock position, palpable on exam, and a single abnormal left axillary lymph node with cortical thickening. Left breast biopsy on 02/20/20 showed invasive and in situ carcinoma in the breast and axilla, grade 3, HER-2 positive (3+), ER/PR+ 95%, Ki67 75%  Treatment plan: 1.Neoadjuvant chemoradiation for vulvar cancer with Taxol, carboplatin, Herceptin weekly x6 cycles 2.neoadjuvant chemotherapy with TCH Perjeta x3 cycles 3.breast conserving surgery if possible with sentinel lymph node study 4. Followed by adjuvant radiation therapy to be completed on 10/23/20 5.Kadcyla maintenance 6.Adjuvant antiestrogen therapy 7.Adjuvant neratinib ------------------------------------------------------------------------------------------------------------------- 07/27/20:Left mastectomy [name]): three foci of IDC, 3.7cm, 1.5cm, and 1.2cm, with intermediate to high grade DCIS, clear margins, with 1/2 left axillary lymph nodes positive for carcinoma.  Current treatment: Todayiscycle5Kadcyla Kadcyla toxicities:Denies any adverse effects to Kadcyla. Mild elevation of AST and ALT: Will be monitored. ASt 41 and ALT 46  Echocardiogram12/02/2020: EF 65 to 70%  Smoking cessation: I sent a prescription for NicoDerm patches  Pelvic pressure/ cramps: Patient will make an appointment to see Dr. Denman George.  Will start anti estrogen pill after XRT is done She will decrease her potassium and magnesium to once daily  Return to clinic every 3 weeks for Kadcyla.

## 2020-11-10 NOTE — Patient Instructions (Signed)
Implanted Port Insertion, Care After This sheet gives you information about how to care for yourself after your procedure. Your health care provider may also give you more specific instructions. If you have problems or questions, contact your health care provider. What can I expect after the procedure? After the procedure, it is common to have:  Discomfort at the port insertion site.  Bruising on the skin over the port. This should improve over 3-4 days. Follow these instructions at home: Port care  After your port is placed, you will get a manufacturer's information card. The card has information about your port. Keep this card with you at all times.  Take care of the port as told by your health care provider. Ask your health care provider if you or a family member can get training for taking care of the port at home. A home health care nurse may also take care of the port.  Make sure to remember what type of port you have. Incision care  Follow instructions from your health care provider about how to take care of your port insertion site. Make sure you: ? Wash your hands with soap and water before and after you change your bandage (dressing). If soap and water are not available, use hand sanitizer. ? Change your dressing as told by your health care provider. ? Leave stitches (sutures), skin glue, or adhesive strips in place. These skin closures may need to stay in place for 2 weeks or longer. If adhesive strip edges start to loosen and curl up, you may trim the loose edges. Do not remove adhesive strips completely unless your health care provider tells you to do that.  Check your port insertion site every day for signs of infection. Check for: ? Redness, swelling, or pain. ? Fluid or blood. ? Warmth. ? Pus or a bad smell.      Activity  Return to your normal activities as told by your health care provider. Ask your health care provider what activities are safe for you.  Do not  lift anything that is heavier than 10 lb (4.5 kg), or the limit that you are told, until your health care provider says that it is safe. General instructions  Take over-the-counter and prescription medicines only as told by your health care provider.  Do not take baths, swim, or use a hot tub until your health care provider approves. Ask your health care provider if you may take showers. You may only be allowed to take sponge baths.  Do not drive for 24 hours if you were given a sedative during your procedure.  Wear a medical alert bracelet in case of an emergency. This will tell any health care providers that you have a port.  Keep all follow-up visits as told by your health care provider. This is important. Contact a health care provider if:  You cannot flush your port with saline as directed, or you cannot draw blood from the port.  You have a fever or chills.  You have redness, swelling, or pain around your port insertion site.  You have fluid or blood coming from your port insertion site.  Your port insertion site feels warm to the touch.  You have pus or a bad smell coming from the port insertion site. Get help right away if:  You have chest pain or shortness of breath.  You have bleeding from your port that you cannot control. Summary  Take care of the port as told by your   health care provider. Keep the manufacturer's information card with you at all times.  Change your dressing as told by your health care provider.  Contact a health care provider if you have a fever or chills or if you have redness, swelling, or pain around your port insertion site.  Keep all follow-up visits as told by your health care provider. This information is not intended to replace advice given to you by your health care provider. Make sure you discuss any questions you have with your health care provider. Document Revised: 03/20/2018 Document Reviewed: 03/20/2018 Elsevier Patient Education   2021 Elsevier Inc.  

## 2020-11-10 NOTE — Patient Instructions (Signed)
[facility] Discharge Instructions for Patients Receiving Chemotherapy  Today you received the following chemotherapy agents abo traztuzumab entansine   To help prevent nausea and vomiting after your treatment, we encourage you to take your nausea medication as directed.    If you develop nausea and vomiting that is not controlled by your nausea medication, call the clinic.   BELOW ARE SYMPTOMS THAT SHOULD BE REPORTED IMMEDIATELY:  *FEVER GREATER THAN 100.5 F  *CHILLS WITH OR WITHOUT FEVER  NAUSEA AND VOMITING THAT IS NOT CONTROLLED WITH YOUR NAUSEA MEDICATION  *UNUSUAL SHORTNESS OF BREATH  *UNUSUAL BRUISING OR BLEEDING  TENDERNESS IN MOUTH AND THROAT WITH OR WITHOUT PRESENCE OF ULCERS  *URINARY PROBLEMS  *BOWEL PROBLEMS  UNUSUAL RASH Items with * indicate a potential emergency and should be followed up as soon as possible.  Feel free to call the clinic should you have any questions or concerns. The clinic phone number is [phone].  Please show the [place] at check-in to the Emergency Department and triage nurse.

## 2020-11-11 LAB — FOLLICLE STIMULATING HORMONE: FSH: 68.4 m[IU]/mL

## 2020-11-12 ENCOUNTER — Ambulatory Visit: Payer: Medicare Other | Admitting: Family Medicine

## 2020-11-16 ENCOUNTER — Other Ambulatory Visit: Payer: Self-pay | Admitting: Family Medicine

## 2020-11-16 ENCOUNTER — Encounter: Payer: Self-pay | Admitting: Family Medicine

## 2020-11-16 ENCOUNTER — Other Ambulatory Visit: Payer: Self-pay

## 2020-11-16 ENCOUNTER — Ambulatory Visit (INDEPENDENT_AMBULATORY_CARE_PROVIDER_SITE_OTHER): Payer: Self-pay | Admitting: Family Medicine

## 2020-11-16 DIAGNOSIS — K529 Noninfective gastroenteritis and colitis, unspecified: Secondary | ICD-10-CM

## 2020-11-16 DIAGNOSIS — E559 Vitamin D deficiency, unspecified: Secondary | ICD-10-CM

## 2020-11-16 DIAGNOSIS — I10 Essential (primary) hypertension: Secondary | ICD-10-CM

## 2020-11-16 DIAGNOSIS — E782 Mixed hyperlipidemia: Secondary | ICD-10-CM

## 2020-11-16 MED ORDER — LISINOPRIL 20 MG PO TABS: 10.0000 mg | ORAL_TABLET | Freq: Every day | ORAL | 3 refills | Status: DC

## 2020-11-16 MED ORDER — ROSUVASTATIN CALCIUM 10 MG PO TABS: 10.0000 mg | ORAL_TABLET | Freq: Every day | ORAL | 3 refills | Status: DC

## 2020-11-16 NOTE — Patient Instructions (Addendum)
  Health Maintenance, Female Adopting a healthy lifestyle and getting preventive care are important in promoting health and wellness. Ask your health care provider about:  The right schedule for you to have regular tests and exams.  Things you can do on your own to prevent diseases and keep yourself healthy. What should I know about diet, weight, and exercise? Eat a healthy diet  Eat a diet that includes plenty of vegetables, fruits, low-fat dairy products, and lean protein.  Do not eat a lot of foods that are high in solid fats, added sugars, or sodium.   Maintain a healthy weight Body mass index (BMI) is used to identify weight problems. It estimates body fat based on height and weight. Your health care provider can help determine your BMI and help you achieve or maintain a healthy weight. Get regular exercise Get regular exercise. This is one of the most important things you can do for your health. Most adults should:  Exercise for at least 150 minutes each week. The exercise should increase your heart rate and make you sweat (moderate-intensity exercise).  Do strengthening exercises at least twice a week. This is in addition to the moderate-intensity exercise.  Spend less time sitting. Even light physical activity can be beneficial. Watch cholesterol and blood lipids Have your blood tested for lipids and cholesterol at 47 years of age, then have this test every 5 years. Have your cholesterol levels checked more often if:  Your lipid or cholesterol levels are high.  You are older than 47 years of age.  You are at high risk for heart disease. What should I know about cancer screening? Depending on your health history and family history, you may need to have cancer screening at various ages. This may include screening for:  Breast cancer.  Cervical cancer.  Colorectal cancer.  Skin cancer.  Lung cancer. What should I know about heart disease, diabetes, and high blood  pressure? Blood pressure and heart disease  High blood pressure causes heart disease and increases the risk of stroke. This is more likely to develop in people who have high blood pressure readings, are of African descent, or are overweight.  Have your blood pressure checked: ? Every 3-5 years if you are 18-39 years of age. ? Every year if you are 40 years old or older. Diabetes Have regular diabetes screenings. This checks your fasting blood sugar level. Have the screening done:  Once every three years after age 40 if you are at a normal weight and have a low risk for diabetes.  More often and at a younger age if you are overweight or have a high risk for diabetes. What should I know about preventing infection? Hepatitis B If you have a higher risk for hepatitis B, you should be screened for this virus. Talk with your health care provider to find out if you are at risk for hepatitis B infection. Hepatitis C Testing is recommended for:  Everyone born from 1945 through 1965.  Anyone with known risk factors for hepatitis C. Sexually transmitted infections (STIs)  Get screened for STIs, including gonorrhea and chlamydia, if: ? You are sexually active and are younger than 47 years of age. ? You are older than 47 years of age and your health care provider tells you that you are at risk for this type of infection. ? Your sexual activity has changed since you were last screened, and you are at increased risk for chlamydia or gonorrhea. Ask your health   care provider if you are at risk.  Ask your health care provider about whether you are at high risk for HIV. Your health care provider may recommend a prescription medicine to help prevent HIV infection. If you choose to take medicine to prevent HIV, you should first get tested for HIV. You should then be tested every 3 months for as long as you are taking the medicine. Pregnancy  If you are about to stop having your period (premenopausal) and  you may become pregnant, seek counseling before you get pregnant.  Take 400 to 800 micrograms (mcg) of folic acid every day if you become pregnant.  Ask for birth control (contraception) if you want to prevent pregnancy. Osteoporosis and menopause Osteoporosis is a disease in which the bones lose minerals and strength with aging. This can result in bone fractures. If you are 65 years old or older, or if you are at risk for osteoporosis and fractures, ask your health care provider if you should:  Be screened for bone loss.  Take a calcium or vitamin D supplement to lower your risk of fractures.  Be given hormone replacement therapy (HRT) to treat symptoms of menopause. Follow these instructions at home: Lifestyle  Do not use any products that contain nicotine or tobacco, such as cigarettes, e-cigarettes, and chewing tobacco. If you need help quitting, ask your health care provider.  Do not use street drugs.  Do not share needles.  Ask your health care provider for help if you need support or information about quitting drugs. Alcohol use  Do not drink alcohol if: ? Your health care provider tells you not to drink. ? You are pregnant, may be pregnant, or are planning to become pregnant.  If you drink alcohol: ? Limit how much you use to 0-1 drink a day. ? Limit intake if you are breastfeeding.  Be aware of how much alcohol is in your drink. In the [place] one drink equals one 12 oz bottle of beer (355 mL), one 5 oz glass of wine (148 mL), or one 1 oz glass of hard liquor (44 mL). General instructions  Schedule regular health, dental, and eye exams.  Stay current with your vaccines.  Tell your health care provider if: ? You often feel depressed. ? You have ever been abused or do not feel safe at home. Summary  Adopting a healthy lifestyle and getting preventive care are important in promoting health and wellness.  Follow your health care provider's instructions about healthy  diet, exercising, and getting tested or screened for diseases.  Follow your health care provider's instructions on monitoring your cholesterol and blood pressure. This information is not intended to replace advice given to you by your health care provider. Make sure you discuss any questions you have with your health care provider. Document Revised: 08/15/2018 Document Reviewed: 08/15/2018 Elsevier Patient Education  2021 [employer]vier Inc.   If you have lab work done today you will be contacted with your lab results within the next 2 weeks.  If you have not heard from us then please contact us. The fastest way to get your results is to register for My Chart.   IF you received an x-ray today, you will receive an invoice from [facility]. Please contact [facility] at [phone] with questions or concerns regarding your invoice.   IF you received labwork today, you will receive an invoice from [facility]. Please contact [facility] at [phone] with questions or concerns regarding your invoice.   Our billing   staff will not be able to assist you with questions regarding bills from these companies.  You will be contacted with the lab results as soon as they are available. The fastest way to get your results is to activate your My Chart account. Instructions are located on the last page of this paperwork. If you have not heard from us regarding the results in 2 weeks, please contact this office.      

## 2020-11-16 NOTE — Progress Notes (Signed)
3/14/20229:22 AM  Sandra Miller 08/20/1976, 45 y.o., female [id number]  Chief Complaint  Patient presents with  . Hypertension    Had low levels and been switched to eod  . chemo treatments    Low mag and potassium    HPI:   Patient is a 45 y.o. female with past medical history significant  for HTN, HLP, bipolar disorder, breast and vulvar cancer and GAD who presents today for routine followup.  Patient Care Team: Riku Buttery, Laurita Quint, FNP as PCP - General (Family Medicine) Mauro Kaufmann, RN as Oncology Nurse Navigator Rockwell Germany, RN as Oncology Nurse Navigator Nicholas Lose, MD as Consulting Physician (Hematology and Oncology) Alphonsa Overall, MD as Consulting Physician (General Surgery) Awanda Mink Craige Cotta, RN as Oncology Nurse Navigator (Oncology)   Has been seeing Dr Carlean Purl for years for functional dyspepsia, GERD, EGD - esophageal stenosis, ulcers; colonoscopy given fhx colon cancer Has had colitis since was in the hospital Will be seeing GI for that, will get another colonoscopy this year   Oncology (Vulvar and breast ca) Chemo til sept Finished radiation two weeks ago  Went back to work last week Has no energy Works at [employer] in the [place]  Jugular clot: Received a grant to get Xarelto for a year   HTN Lisinopril 20mg  Was hypotensive and dizzy  at chemo per oncology taking lisinopril 20mg  every other day Takes the medication at night Took it last night Has another ECHO scheduled for tomorrow Not at goal< 130/80 BP Readings from Last 3 Encounters:  11/16/20 (!) 146/90  11/10/20 (!) 119/54  10/30/20 126/88     Has had a few weeks of pelvic pain Had an ultrasound done Removing ovaries and fallopian tubes in November Lower mid abdominal pain Intermittent pain Dull pain, not sharp or severe    Depression screen [facility] 2/9 11/16/2020 08/11/2020  Decreased Interest 0 0  Down, Depressed, Hopeless 0 0  PHQ - 2 Score 0 0  Some recent data might be  hidden    Fall Risk  11/16/2020 08/11/2020 10/31/2019 12/27/2018 10/09/2018  Falls in the past year? 0 0 0 0 0  Number falls in past yr: 0 0 0 0 0  Injury with Fall? 0 0 0 0 0  Follow up Falls evaluation completed - Falls evaluation completed - -     Allergies  Allergen Reactions  . Dilaudid [Hydromorphone Hcl] Other (See Comments)    Pt became confused, pulled out iv's, does not remember anything  . Depakote [Divalproex Sodium] Nausea And Vomiting  . Minocycline Hives  . Aspirin Hives    States able to tolerate Goody Powders without any problem except for GI upset    Prior to Admission medications   Medication Sig Start Date End Date Taking? Authorizing Provider  acetaminophen (TYLENOL) 500 MG tablet Take 2 tablets (1,000 mg total) by mouth every 6 (six) hours as needed for moderate pain or fever. Avoid tylenol until your liver function back to normal 06/19/20  Yes Florencia Reasons, MD  diphenoxylate-atropine (LOMOTIL) 2.5-0.025 MG tablet TAKE 1 TO 2 TABLETS BY MOUTH FOUR TIMES DAILY AS NEEDED FOR DIARRHEA Patient taking differently: Take 1-2 tablets by mouth 4 (four) times daily as needed for diarrhea or loose stools. TAKE 1 TO 2 TABLETS BY MOUTH FOUR TIMES DAILY AS NEEDED FOR DIARRHEA 05/26/20  Yes Nicholas Lose, MD  dronabinol (MARINOL) 2.5 MG capsule Take 1 capsule (2.5 mg total) by mouth 2 (two) times daily before a  meal. Patient taking differently: Take 2.5 mg by mouth 2 (two) times daily as needed (nausea).  06/04/20  Yes Tanner, Lyndon Code., PA-C  lidocaine-prilocaine (EMLA) cream Apply 1 application topically daily as needed (prior to port access).    Yes [provider]  lidocaine-prilocaine (EMLA) cream Apply to affected area once 08/04/20  Yes Nicholas Lose, MD  lisinopril (ZESTRIL) 20 MG tablet Take 1 tablet (20 mg total) by mouth daily. 07/15/20  Yes Nicholas Lose, MD  LORazepam (ATIVAN) 1 MG tablet Take 1 tablet (1 mg total) by mouth 3 (three) times daily as needed for anxiety.  06/26/20  Yes Arfeen, Arlyce Harman, MD  magnesium oxide (MAG-OX) 400 (241.3 Mg) MG tablet Take 1 tablet (400 mg total) by mouth daily. 06/26/20  Yes Nicholas Lose, MD  Multiple Vitamin (MULTIVITAMIN WITH MINERALS) TABS tablet Take 1 tablet by mouth daily.   Yes [provider]  nicotine (NICODERM CQ - DOSED IN MG/24 HOURS) 21 mg/24hr patch Place 1 patch (21 mg total) onto the skin daily. 06/19/20  Yes Florencia Reasons, MD  OLANZapine (ZYPREXA) 10 MG tablet Take 1 tablet (10 mg total) by mouth at bedtime. 06/26/20 06/26/21 Yes Arfeen, Arlyce Harman, MD  omeprazole (PRILOSEC) 40 MG capsule TAKE 1 CAPSULE BY MOUTH TWO TIMES DAILY BEFORE A MEAL 08/03/20  Yes Gatha Mayer, MD  potassium chloride SA (KLOR-CON) 20 MEQ tablet Take 1 tablet (20 mEq total) by mouth daily. 06/19/20  Yes Florencia Reasons, MD  prochlorperazine (COMPAZINE) 10 MG tablet TAKE 1 TABLET BY MOUTH EVERY 6 HOURS AS NEEDED FOR NAUSEA OR VOMITING Patient taking differently: Take 10 mg by mouth every 6 (six) hours as needed for nausea or vomiting.  06/16/20  Yes Nicholas Lose, MD  promethazine (PHENERGAN) 25 MG tablet Take 1 tablet (25 mg total) by mouth every 6 (six) hours as needed for nausea. 04/28/20  Yes Nicholas Lose, MD  rivaroxaban (XARELTO) 20 MG TABS tablet Take 1 tablet (20 mg total) by mouth daily with supper. 06/19/20  Yes Florencia Reasons, MD  traMADol (ULTRAM) 50 MG tablet Take 1 tablet (50 mg total) by mouth every 6 (six) hours as needed for moderate pain or severe pain (mild pain). 07/28/20  Yes Alphonsa Overall, MD  venlafaxine XR (EFFEXOR XR) 37.5 MG 24 hr capsule Take three capsule (112.5 mg total) by mouth daily. 06/26/20  Yes Arfeen, Arlyce Harman, MD    Past Medical History:  Diagnosis Date  . Bipolar 1 disorder ([place])   . Cancer ([place])   . Complication of anesthesia    wakes up during procedures  . Depression   . GAD (generalized anxiety disorder)   . Genital HSV    currently per pt  no break out 03-22-2016   . GERD (gastroesophageal reflux  disease)   . Hiatal hernia   . History of cervical dysplasia    2012 laser ablation  . History of esophageal dilatation    for dysphasia -- x2 dilated  . History of gastric ulcer   . History of Helicobacter pylori infection    remote hx  . History of hidradenitis suppurativa    "gets all over body intermittantly"    . History of hypertension    no issue since stopped drinking alcohol 2014  . History of kidney stones   . History of panic attacks   . Hypertension   . Iron deficiency anemia   . Left ureteral stone   . OCD (obsessive compulsive disorder)   . PONV (  postoperative nausea and vomiting)   . PTSD (post-traumatic stress disorder)   . Recovering alcoholic in remission ([place])    since 2014  . RLS (restless legs syndrome)   . Smokers' cough ([place])   . Urgency of urination   . Yeast infection involving the vagina and surrounding area    secondary to taking antibiotic    Past Surgical History:  Procedure Laterality Date  . CESAREAN SECTION  1995   w/  Bilateral Tubal Ligation  . COLONOSCOPY  last one 08-09-2013  . CYSTOSCOPY W/ URETERAL STENT PLACEMENT Left 03/29/2016   Procedure: CYSTOSCOPY WITH STENT REPLACEMENT;  Surgeon: Nickie Retort, MD;  Location: [facility];  Service: Urology;  Laterality: Left;  . CYSTOSCOPY WITH RETROGRADE PYELOGRAM, URETEROSCOPY AND STENT PLACEMENT Left 03/08/2016   Procedure: CYSTOSCOPY WITH  LEFT RETROGRADE PYELOGRAM, AND STENT PLACEMENT;  Surgeon: Nickie Retort, MD;  Location: [facility];  Service: Urology;  Laterality: Left;  . CYSTOSCOPY/RETROGRADE/URETEROSCOPY/STONE EXTRACTION WITH BASKET Left 03/29/2016   Procedure: CYSTOSCOPY/RETROGRADE/URETEROSCOPY/STONE EXTRACTION WITH BASKET;  Surgeon: Nickie Retort, MD;  Location: [facility];  Service: Urology;  Laterality: Left;  . ENDOMETRIAL ABLATION W/ NOVASURE  04-01-2010  . ESOPHAGOGASTRODUODENOSCOPY  last one 08-09-2013  . KNEE ARTHROSCOPY Left as teen  .  LASER ABLATION OF THE CERVIX  2012 approx  . MASTECTOMY WITH AXILLARY LYMPH NODE DISSECTION Left 07/27/2020   Procedure: LEFT MASTECTOMY WITH LEFT RADIOACTIVE SEED GUIDED TARGETED AXILLARY LYMPH NODE DISSECTION;  Surgeon: Alphonsa Overall, MD;  Location: [place];  Service: General;  Laterality: Left;  . PORTACATH PLACEMENT Right 03/06/2020   Procedure: INSERTION PORT-A-CATH WITH ULTRASOUND GUIDANCE;  Surgeon: Alphonsa Overall, MD;  Location: [place];  Service: General;  Laterality: Right;  . TRANSTHORACIC ECHOCARDIOGRAM  05-19-2006   lvsf normal, ef 55-65%, there was mild flattening of the interventricular septum during diastoli/  RV size at upper limits normal  . TUBAL LIGATION    . WISDOM TOOTH EXTRACTION  age 34 's    Social History   Tobacco Use  . Smoking status: Current Every Day Smoker    Packs/day: 0.50    Years: 22.00    Pack years: 11.00    Types: Cigarettes  . Smokeless tobacco: Never Used  . Tobacco comment: Recently started a smoking cessation class.   Substance Use Topics  . Alcohol use: Not Currently    Alcohol/week: 0.0 standard drinks    Comment:  hx alcohollism  in remission since 2014    Family History  Problem Relation Age of Onset  . Heart disease Father   . Lung cancer Father        d. 36  . Alcohol abuse Father   . Heart disease Mother   . Depression Mother   . Anxiety disorder Mother   . Drug abuse Brother   . Alcohol abuse Brother   . Drug abuse Brother   . ADD / ADHD Brother   . Colon polyps Brother   . Cancer Paternal Grandfather        "stomach"  . Diabetes Maternal Grandfather   . Diabetes Paternal Grandmother   . Kidney disease Maternal Uncle   . Cirrhosis Cousin        alcoholic  . Anxiety disorder Maternal Aunt   . Depression Maternal Aunt   . Cancer Cousin        maternal; ovarian cancer or other "female" cancer?  . Lung cancer Paternal Uncle 62  . Throat cancer  Cousin        paternal; dx 50s  . Lung cancer Cousin         paternal; dx 11s    Review of Systems  Constitutional: Positive for malaise/fatigue. Negative for chills and fever.  Eyes: Negative for blurred vision and double vision.  Respiratory: Negative for cough, shortness of breath and wheezing.   Cardiovascular: Negative for chest pain, palpitations and leg swelling.  Gastrointestinal: Positive for abdominal pain. Negative for blood in stool, constipation, diarrhea, heartburn, nausea and vomiting.  Genitourinary: Negative for dysuria, frequency and hematuria.  Musculoskeletal: Negative for back pain and joint pain.  Skin: Negative for rash.  Neurological: Positive for weakness. Negative for dizziness and headaches.  Endo/Heme/Allergies: Bruises/bleeds easily.  Psychiatric/Behavioral: Negative for depression and suicidal ideas. The patient is not nervous/anxious.      OBJECTIVE:  Today's Vitals   11/16/20 0901  BP: (!) 146/90  Pulse: 98  Temp: 97.6 F (36.4 C)  SpO2: 98%  Weight: 196 lb (88.9 kg)  Height: 5' 5.5" (1.664 m)   Body mass index is 32.12 kg/m.   Physical Exam Constitutional:      General: She is not in acute distress.    Appearance: Normal appearance. She is not ill-appearing.  HENT:     Head: Normocephalic.  Cardiovascular:     Rate and Rhythm: Normal rate and regular rhythm.     Pulses: Normal pulses.     Heart sounds: Normal heart sounds. No murmur heard. No friction rub. No gallop.   Pulmonary:     Effort: Pulmonary effort is normal. No respiratory distress.     Breath sounds: Normal breath sounds. No stridor. No wheezing, rhonchi or rales.  Abdominal:     General: Bowel sounds are normal.     Palpations: Abdomen is soft.     Tenderness: There is no abdominal tenderness.  Musculoskeletal:     Right lower leg: No edema.     Left lower leg: No edema.  Skin:    General: Skin is warm and dry.  Neurological:     Mental Status: She is alert and oriented to person, place, and time.  Psychiatric:         Mood and Affect: Mood normal.        Behavior: Behavior normal.     No results found for this or any previous visit (from the past 24 hour(s)).  No results found.   ASSESSMENT and PLAN  Problem List Items Addressed This Visit      Cardiovascular and Mediastinum   Essential hypertension   Relevant Medications   lisinopril (ZESTRIL) 20 MG tablet   rosuvastatin (CRESTOR) 10 MG tablet     Digestive   Colitis     Other   Hyperlipidemia   Relevant Medications   lisinopril (ZESTRIL) 20 MG tablet   rosuvastatin (CRESTOR) 10 MG tablet   Other Relevant Orders   Lipid Panel   Hypomagnesemia - Primary   Relevant Orders   Magnesium    Other Visit Diagnoses    Vitamin D deficiency       Relevant Orders   Vitamin D, 25-hydroxy         Plan  Will follow up with lab results  Decrease lisinopril to 10 mg daily   Return in about 3 months (around 02/16/2021).   Huston Foley Naftula Donahue, FNP-BC Primary Care at [place], [place] [address] Ph.  [phone] Fax [phone]

## 2020-11-17 ENCOUNTER — Ambulatory Visit (HOSPITAL_COMMUNITY)
Admission: RE | Admit: 2020-11-17 | Discharge: 2020-11-17 | Disposition: A | Discharge status: Home / Self Care | Payer: Self-pay | Source: Ambulatory Visit | Attending: Hematology and Oncology | Admitting: Hematology and Oncology

## 2020-11-17 ENCOUNTER — Encounter: Payer: Self-pay | Admitting: Radiology

## 2020-11-17 DIAGNOSIS — Z170 Estrogen receptor positive status [ER+]: Secondary | ICD-10-CM | POA: Insufficient documentation

## 2020-11-17 DIAGNOSIS — Z01818 Encounter for other preprocedural examination: Secondary | ICD-10-CM | POA: Insufficient documentation

## 2020-11-17 DIAGNOSIS — F17200 Nicotine dependence, unspecified, uncomplicated: Secondary | ICD-10-CM | POA: Insufficient documentation

## 2020-11-17 DIAGNOSIS — C519 Malignant neoplasm of vulva, unspecified: Secondary | ICD-10-CM | POA: Insufficient documentation

## 2020-11-17 DIAGNOSIS — I10 Essential (primary) hypertension: Secondary | ICD-10-CM | POA: Insufficient documentation

## 2020-11-17 DIAGNOSIS — Z0189 Encounter for other specified special examinations: Secondary | ICD-10-CM

## 2020-11-17 DIAGNOSIS — C50212 Malignant neoplasm of upper-inner quadrant of left female breast: Secondary | ICD-10-CM | POA: Insufficient documentation

## 2020-11-17 LAB — LIPID PANEL
Chol/HDL Ratio: 3.4 ratio (ref 0.0–4.4)
Cholesterol, Total: 172 mg/dL (ref 100–199)
HDL: 51 mg/dL (ref >39)
LDL Chol Calc (NIH): 100 mg/dL — ABNORMAL HIGH (ref 0–99)
Triglycerides: 116 mg/dL (ref 0–149)
VLDL Cholesterol Cal: 21 mg/dL (ref 5–40)

## 2020-11-17 LAB — MAGNESIUM: Magnesium: 0.7 mg/dL — CL (ref 1.6–2.3)

## 2020-11-17 LAB — VITAMIN D 25 HYDROXY (VIT D DEFICIENCY, FRACTURES): Vit D, 25-Hydroxy: 37 ng/mL (ref 30.0–100.0)

## 2020-11-17 LAB — ESTRADIOL, ULTRA SENS: Estradiol, Sensitive: <2.5 pg/mL

## 2020-11-17 NOTE — Progress Notes (Signed)
  Echocardiogram 2D Echocardiogram has been performed.  Sandra Miller 11/17/2020, 9:35 AM

## 2020-11-18 ENCOUNTER — Telehealth: Payer: Self-pay | Admitting: Internal Medicine

## 2020-11-18 LAB — ECHOCARDIOGRAM COMPLETE
Area-P 1/2: 5.66 cm2
S' Lateral: 2.7 cm

## 2020-11-18 NOTE — Telephone Encounter (Signed)
Rescheduled patient to 11/20/2020.

## 2020-11-18 NOTE — Telephone Encounter (Signed)
Inbound call from patient stating she is experiencing severe abdominal pain with diarrhea.  She is scheduled for 11/24/20 to see Ellouise Newer but is requesting a call back from a nurse to see if she can be seen sooner.  Lease advise.

## 2020-11-18 NOTE — Telephone Encounter (Signed)
Multiple openings on APPS before 3/22.  Please contact the patient and reschedule her to an open APP

## 2020-11-20 ENCOUNTER — Encounter: Payer: Self-pay | Admitting: Nurse Practitioner

## 2020-11-20 ENCOUNTER — Ambulatory Visit (INDEPENDENT_AMBULATORY_CARE_PROVIDER_SITE_OTHER): Payer: Self-pay | Admitting: Nurse Practitioner

## 2020-11-20 ENCOUNTER — Other Ambulatory Visit: Payer: Self-pay | Admitting: Nurse Practitioner

## 2020-11-20 ENCOUNTER — Other Ambulatory Visit: Payer: Self-pay

## 2020-11-20 VITALS — BP 140/88 | HR 98 | Temp 98.7°F | Ht 65.5 in | Wt 195.0 lb

## 2020-11-20 DIAGNOSIS — R109 Unspecified abdominal pain: Secondary | ICD-10-CM

## 2020-11-20 DIAGNOSIS — G8929 Other chronic pain: Secondary | ICD-10-CM

## 2020-11-20 MED ORDER — OMEPRAZOLE 40 MG PO CPDR: 40.0000 mg | DELAYED_RELEASE_CAPSULE | Freq: Two times a day (BID) | ORAL | 3 refills | Status: DC

## 2020-11-20 MED ORDER — GABAPENTIN 300 MG PO CAPS: 300.0000 mg | ORAL_CAPSULE | Freq: Every day | ORAL | 5 refills | Status: DC

## 2020-11-20 NOTE — Progress Notes (Signed)
ASSESSMENT AND PLAN    # 45 year old female with generalized abdominal pain.  She has a history of functional dyspepsia.  When we last saw her in May she was doing well on omeprazole and gabapentin.  During October hospital admission she was found to have abnormal liver enzymes and Neurontin ( ? And some other home meds) were discontinued.  CT scan of the abdomen and pelvis with contrast that admission was negative for any hepatobiliary findings her liver enzymes have normalized.  It is not clear for sure what actually caused the rise in her liver enzymes at the time.  --I am going to resume her gabapentin for chronic abdominal pain --Patient will let us know if her pain does not improve.  # Possible mild acute colitis October 2021 after presenting with nausea, vomiting and diarrhea.  Stool studies were negative.  She still has some diarrhea but overall it is improved  #Family history of colon polyps in her brother.  She is due for a 10-year interval colonoscopy in 2024.   --At this point I do not see a reason to do it any sooner especially when she is on Xarelto and also undergoing chemotherapy but I will ask Dr. Carlean Purl, her primary GI to review the CT scan and make sure he does not have any concerns that would warrant a sooner colonoscopy  # Breast Cancer, stage 2, s/p left mastectomy November 2021. Undergoing chemotheapy with Dr. Lindi Adie.   # Hx of DVTof jugular vein, on [place]     Primary Gastroenterologist : Silvano Rusk, MD  Chief Complaint : diarrhea and "my stomach is killing me"  Sandra Miller is a 45 y.o. female with past medical history of vulvar cancer.  She also has a history of breast cancer, status post mastectomy November 2021.  She is currently undergoing chemotherapy for breast cancer.  Sandra Miller also has a history of a jugular vein DVT for which she is anticoagulated.   Sandra Miller was hospitalized in Oct with nausea, vomitig / diarrhea  and severe electrolyte imbalances. WBC was markedly elevated.  Additionally she had marked elevation in her AST to 900.  Her ALT was elevated as well to 193 CT scan suspicious for mild coliits.  GI pathogen panel and C-dff negative.  Patient was treated with fluids and initially IV antibiotics.  Her case was discussed with her oncologist who felt that her symptoms were likely a reaction to cancer treatment and recommended antibiotics be discontinued.  Her symptoms improved and she was discharged home within a couple of days.  Regarding her abnormal liver studies patient says that some of her medications were discontinued including Neurontin.   Sandra Miller comes in today with complaints of abdominal pain.  She is still having diarrhea but not nearly as bad as when she was admitted.  Her liver chemistries were normal when checked a few days ago.  WBC was also normal at 6.4.   Past Medical History:  Diagnosis Date  . Bipolar 1 disorder ([place])   . Cancer ([place])   . Complication of anesthesia    wakes up during procedures  . Depression   . GAD (generalized anxiety disorder)   . Genital HSV    currently per pt  no break out 03-22-2016   . GERD (gastroesophageal reflux disease)   . Hiatal hernia   . History of cervical dysplasia    2012 laser ablation  . History of esophageal dilatation  for dysphasia -- x2 dilated  . History of gastric ulcer   . History of Helicobacter pylori infection    remote hx  . History of hidradenitis suppurativa    "gets all over body intermittantly"    . History of hypertension    no issue since stopped drinking alcohol 2014  . History of kidney stones   . History of panic attacks   . History of radiation therapy 03/16/2020-05/08/2020   vulva  Dr Gery Pray  . History of radiation therapy 09/10/2020-10/23/2020   left breast  Dr Gery Pray  . Hypertension   . Iron deficiency anemia   . Left ureteral stone   . OCD (obsessive compulsive disorder)   . PONV  (postoperative nausea and vomiting)   . PTSD (post-traumatic stress disorder)   . Recovering alcoholic in remission ([place])    since 2014  . RLS (restless legs syndrome)   . Smokers' cough ([place])   . Urgency of urination   . Yeast infection involving the vagina and surrounding area    secondary to taking antibiotic    Current Medications, Allergies, Past Surgical History, Family History and Social History were reviewed in [employer] record.   Current Outpatient Medications  Medication Sig Dispense Refill  . acetaminophen (TYLENOL) 500 MG tablet Take 2 tablets (1,000 mg total) by mouth every 6 (six) hours as needed for moderate pain or fever. Avoid tylenol until your liver function back to normal 30 tablet 0  . Cholecalciferol (D3-1000 PO) Take 1 tablet by mouth daily.    . diphenoxylate-atropine (LOMOTIL) 2.5-0.025 MG tablet TAKE 1 TO 2 TABLETS BY MOUTH FOUR TIMES DAILY AS NEEDED FOR DIARRHEA (Patient taking differently: Take 1-2 tablets by mouth 4 (four) times daily as needed for diarrhea or loose stools. TAKE 1 TO 2 TABLETS BY MOUTH FOUR TIMES DAILY AS NEEDED FOR DIARRHEA) 45 tablet 1  . doxylamine, Sleep, (UNISOM) 25 MG tablet Take 25 mg by mouth as needed.    . dronabinol (MARINOL) 2.5 MG capsule Take 1 capsule (2.5 mg total) by mouth 2 (two) times daily before a meal. 60 capsule 0  . lidocaine-prilocaine (EMLA) cream Apply to affected area once 30 g 3  . lisinopril (ZESTRIL) 20 MG tablet Take 0.5 tablets (10 mg total) by mouth daily. 90 tablet 3  . LORazepam (ATIVAN) 1 MG tablet Take 1 tablet (1 mg total) by mouth 3 (three) times daily as needed for anxiety. 90 tablet 2  . magnesium oxide (MAG-OX) 400 (241.3 Mg) MG tablet Take 1 tablet (400 mg total) by mouth 2 (two) times daily. 60 tablet 1  . Multiple Vitamin (MULTIVITAMIN WITH MINERALS) TABS tablet Take 1 tablet by mouth daily.    . nicotine (NICODERM CQ - DOSED IN MG/24 HOURS) 14 mg/24hr patch Place 1 patch (14  mg total) onto the skin daily. 28 patch 0  . OLANZapine (ZYPREXA) 10 MG tablet Take 1 tablet (10 mg total) by mouth at bedtime. 90 tablet 0  . omeprazole (PRILOSEC) 40 MG capsule Take 1 capsule (40 mg total) by mouth in the morning and at bedtime. Please schedule an Office visit for further refills. Thank you 180 capsule 0  . potassium chloride SA (KLOR-CON) 20 MEQ tablet TAKE 1 TABLET BY MOUTH TWICE DAILY 60 tablet 2  . promethazine (PHENERGAN) 25 MG tablet Take 1 tablet (25 mg total) by mouth every 6 (six) hours as needed for nausea. 30 tablet 3  . rivaroxaban (XARELTO) 20 MG TABS  tablet Take 1 tablet (20 mg total) by mouth daily with supper. 30 tablet 6  . rosuvastatin (CRESTOR) 10 MG tablet Take 1 tablet (10 mg total) by mouth daily. 90 tablet 3  . venlafaxine XR (EFFEXOR XR) 37.5 MG 24 hr capsule Take three capsule (112.5 mg total) by mouth daily. 90 capsule 2   No current facility-administered medications for this visit.   Facility-Administered Medications Ordered in Other Visits  Medication Dose Route Frequency Provider Last Rate Last Admin  . LORazepam (ATIVAN) injection 0.5 mg  0.5 mg Intravenous Once Harle Stanford., PA-C        Review of Systems: No chest pain. No shortness of breath. No urinary complaints.   PHYSICAL EXAM :    Wt Readings from Last 3 Encounters:  11/20/20 195 lb (88.5 kg)  11/16/20 196 lb (88.9 kg)  11/10/20 195 lb 1.6 oz (88.5 kg)    BP 140/88   Pulse 98   Temp 98.7 F (37.1 C) (Tympanic)   Ht 5' 5.5" (1.664 m)   Wt 195 lb (88.5 kg)   SpO2 98%   BMI 31.96 kg/m  Constitutional:  Pleasant female in no acute distress. Psychiatric: Normal mood and affect. Behavior is normal. EENT: Pupils normal.  Conjunctivae are normal. No scleral icterus. Neck supple.  Cardiovascular: Normal rate, regular rhythm. No edema Pulmonary/chest: Effort normal and breath sounds normal. No wheezing, rales or rhonchi. Abdominal: Soft, nondistended, nontender. Bowel sounds  active throughout. There are no masses palpable. No hepatomegaly. Neurological: Alert and oriented to person place and time. Skin: Skin is warm and dry. No rashes noted.  [name] Savoy, NP  11/20/2020, 11:07 AM

## 2020-11-20 NOTE — Patient Instructions (Signed)
It was a pleasure to see you today. Based on our discussion, I am providing you with my recommendations below:  It was a pleasure to meet you today. Based on our discussion, I am providing you with my recommendations below:  RECOMMENDATION(S):   . Restart Gabapentin 300 mg once daily. Marland Kitchen Refilled Omeprazole.    BMI:  . If you are age 47 or older, your body mass index should be between 23-30. Your Body mass index is 31.96 kg/m. If this is out of the aforementioned range listed, please consider follow up with your Primary Care Provider.  . If you are age 74 or younger, your body mass index should be between 19-25. Your Body mass index is 31.96 kg/m. If this is out of the aformentioned range listed, please consider follow up with your Primary Care Provider.   Thank you for trusting me with your gastrointestinal care!    [name] Savoy, NP

## 2020-11-23 ENCOUNTER — Other Ambulatory Visit: Payer: Self-pay

## 2020-11-23 ENCOUNTER — Encounter: Payer: Self-pay | Admitting: Radiation Oncology

## 2020-11-23 ENCOUNTER — Telehealth (HOSPITAL_COMMUNITY): Payer: Self-pay | Admitting: *Deleted

## 2020-11-23 ENCOUNTER — Telehealth: Payer: Self-pay | Admitting: *Deleted

## 2020-11-23 ENCOUNTER — Ambulatory Visit
Admission: RE | Admit: 2020-11-23 | Discharge: 2020-11-23 | Disposition: A | Discharge status: Home / Self Care | Payer: Self-pay | Source: Ambulatory Visit | Attending: Radiation Oncology | Admitting: Radiation Oncology

## 2020-11-23 VITALS — BP 148/90 | HR 93 | Temp 96.7°F | Resp 18 | Ht 65.5 in | Wt 192.4 lb

## 2020-11-23 DIAGNOSIS — Z923 Personal history of irradiation: Secondary | ICD-10-CM | POA: Insufficient documentation

## 2020-11-23 DIAGNOSIS — C50212 Malignant neoplasm of upper-inner quadrant of left female breast: Secondary | ICD-10-CM | POA: Insufficient documentation

## 2020-11-23 DIAGNOSIS — Z170 Estrogen receptor positive status [ER+]: Secondary | ICD-10-CM | POA: Insufficient documentation

## 2020-11-23 DIAGNOSIS — R5383 Other fatigue: Secondary | ICD-10-CM | POA: Insufficient documentation

## 2020-11-23 DIAGNOSIS — Z79899 Other long term (current) drug therapy: Secondary | ICD-10-CM | POA: Insufficient documentation

## 2020-11-23 DIAGNOSIS — Z7901 Long term (current) use of anticoagulants: Secondary | ICD-10-CM | POA: Insufficient documentation

## 2020-11-23 DIAGNOSIS — C519 Malignant neoplasm of vulva, unspecified: Secondary | ICD-10-CM

## 2020-11-23 NOTE — Progress Notes (Signed)
Patient in for 1 month follow up, denies any pain or discomfort. Reports increased fatigue stating that she just returned to work. Denies any skin irration. Denies swelling. Able to perform Range of motion to left arm.   Vitals:   11/23/20 1554  BP: (!) 148/90  Pulse: 93  Resp: 18  Temp: (!) 96.7 F (35.9 C)  TempSrc: Temporal  SpO2: 100%  Weight: 192 lb 6 oz (87.3 kg)  Height: 5' 5.5" (1.664 m)

## 2020-11-23 NOTE — Progress Notes (Incomplete)
  Patient Name: Sandra Miller MRN: [id number] DOB: 27-Nov-1975 Referring Physician: Nicholas Lose (Profile Not Attached) Date of Service: 10/23/2020 [facility], [place]                                                        End Of Treatment Note  Diagnoses: C50.212-Malignant neoplasm of upper-inner quadrant of left female breast  Cancer Staging: Stage IB (ypT2, ypN1a) Multicentric Left Breast, Invasive Ductal Carcinoma with DCIS, ER+ / PR+ / Her2+, Grade 3  Intent: Curative  Radiation Treatment Dates: 09/09/2020 through 10/23/2020  Site: Left chest wall Technique: 3D Total Dose (Gy): 50/50 Dose per Fx (Gy): 2 Completed Fx: 25/25 Beam Energies: 6X, 10X  Site: Left SCV Technique: 3D Total Dose (Gy): 50/50 Dose per Fx (Gy): 2 Completed Fx: 25/25 Beam Energies: 6X, 10X  Site: Left chest wall boost Technique: Electron Total Dose (Gy): 10/10 Dose per Fx (Gy): 2 Completed Fx: 5/5 Beam Energies: 6E  Narrative: The patient tolerated radiation therapy relatively well. She did report moderate fatigue and pain/sensitivity to the left chest wall. She denied limitations with movement of left arm/shoulder and shortness of breath. The left chest wall area showed some mild hyperpigmentation changes during the beginning of treatment. As treatment continued, she developed some skin breakdown along the parasternal area for which she used triple antibiotic ointment.  Plan: The patient will follow-up with radiation oncology in one month or sooner if skin reaction becomes more significant.  ________________________________________________  Blair Promise, PhD, MD  This document serves as a record of services personally performed by Gery Pray, MD. It was created on his behalf by Clerance Lav, a trained medical scribe. The  creation of this record is based on the scribe's personal observations and the provider's statements to them. This document has been checked and approved by the attending provider.

## 2020-11-23 NOTE — Progress Notes (Signed)
Radiation Oncology         [phone]) [phone] ________________________________  Name: Sandra Miller MRN: [id number]  Date: 11/23/2020  DOB: September 09, 1975  Follow-Up Visit Note  CC: Nicholas Lose, MD  Nicholas Lose, MD    ICD-10-CM   1. Vulvar cancer [facility])  C51.9   2. 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   Interval Since Last Radiation: One month and three days  Radiation Treatment Dates: 09/09/2020 through 10/23/2020  Site: Left chest wall Technique: 3D Total Dose (Gy): 50/50 Dose per Fx (Gy): 2 Completed Fx: 25/25 Beam Energies: 6X, 10X  Site: Left SCV Technique: 3D Total Dose (Gy): 50/50 Dose per Fx (Gy): 2 Completed Fx: 25/25 Beam Energies: 6X, 10X  Site: Left chest wall boost Technique: Electron Total Dose (Gy): 10/10 Dose per Fx (Gy): 2 Completed Fx: 5/5 Beam Energies: 6E  S/p primary radiation completed 05/08/20 with external beam to the pelvis (45 Gy) and boost to the vulva (16.2 Gy).  Narrative:  The patient returns today for routine follow-up. Since the end of treatment, the patient was seen by Dr. Denman George on 10/30/2020 for follow-up of her vulvar cancer. There was no evidence of recurrent disease on examination at that time. Pelvic ultrasound on 11/06/2020 was normal.  The patient was last seen by Dr. Lindi Adie on 11/10/2020, at which time she was to continue on Kadcycla maintenance, which she has been tolerating well.   On review of systems, she reports ongoing fatigue but has started back to work full-time. She denies itching or  discomfort along the left chest wall area.  She denies any cough or shortness of breath.  She did have some pain in the pelvis region and therefore ultrasound was performed as above.  Primary care physician felt this was related to her history of colitis.  She denies any urinary incontinence or fecal incontinence or problems with constipation or diarrhea.  She denies any hematuria or vaginal bleeding..  ALLERGIES:  is allergic to dilaudid [hydromorphone hcl], depakote [divalproex sodium], minocycline, and aspirin.  Meds: Current Outpatient Medications  Medication Sig Dispense Refill   acetaminophen (TYLENOL) 500 MG tablet Take 2 tablets (1,000 mg total) by mouth every 6 (six) hours as needed for moderate pain or fever. Avoid tylenol until your liver function back to normal 30 tablet 0   Cholecalciferol (D3-1000 PO) Take 1 tablet by mouth daily.     diphenoxylate-atropine (LOMOTIL) 2.5-0.025 MG tablet TAKE 1 TO 2 TABLETS BY MOUTH FOUR TIMES DAILY AS NEEDED FOR DIARRHEA (Patient taking differently: Take 1-2 tablets by mouth 4 (four) times daily as needed for diarrhea or loose stools. TAKE 1 TO 2 TABLETS BY MOUTH FOUR TIMES DAILY AS NEEDED FOR DIARRHEA) 45 tablet 1   doxylamine, Sleep, (UNISOM) 25 MG tablet Take 25 mg by mouth as needed.     dronabinol (MARINOL) 2.5 MG capsule Take 1 capsule (2.5 mg total) by mouth 2 (two) times daily before a meal. 60 capsule 0   gabapentin (NEURONTIN) 300 MG capsule Take 1 capsule (300 mg total) by mouth daily. 30 capsule 5   lidocaine-prilocaine (EMLA) cream Apply to  affected area once 30 g 3   lisinopril (ZESTRIL) 20 MG tablet Take 0.5 tablets (10 mg total) by mouth daily. 90 tablet 3   LORazepam (ATIVAN) 1 MG tablet Take 1 tablet (1 mg total) by mouth 3 (three) times daily as needed for anxiety. 90 tablet 2   magnesium oxide (MAG-OX) 400 (241.3 Mg) MG tablet Take 1 tablet (400 mg total) by mouth 2 (two) times daily. 60 tablet 1   Multiple Vitamin  (MULTIVITAMIN WITH MINERALS) TABS tablet Take 1 tablet by mouth daily.     nicotine (NICODERM CQ - DOSED IN MG/24 HOURS) 14 mg/24hr patch Place 1 patch (14 mg total) onto the skin daily. 28 patch 0   OLANZapine (ZYPREXA) 10 MG tablet Take 1 tablet (10 mg total) by mouth at bedtime. 90 tablet 0   omeprazole (PRILOSEC) 40 MG capsule Take 1 capsule (40 mg total) by mouth in the morning and at bedtime. 60 capsule 3   potassium chloride SA (KLOR-CON) 20 MEQ tablet TAKE 1 TABLET BY MOUTH TWICE DAILY 60 tablet 2   promethazine (PHENERGAN) 25 MG tablet Take 1 tablet (25 mg total) by mouth every 6 (six) hours as needed for nausea. 30 tablet 3   rivaroxaban (XARELTO) 20 MG TABS tablet Take 1 tablet (20 mg total) by mouth daily with supper. 30 tablet 6   rosuvastatin (CRESTOR) 10 MG tablet Take 1 tablet (10 mg total) by mouth daily. 90 tablet 3   venlafaxine XR (EFFEXOR XR) 37.5 MG 24 hr capsule Take three capsule (112.5 mg total) by mouth daily. 90 capsule 2   No current facility-administered medications for this encounter.   Facility-Administered Medications Ordered in Other Encounters  Medication Dose Route Frequency Provider Last Rate Last Admin   LORazepam (ATIVAN) injection 0.5 mg  0.5 mg Intravenous Once Harle Stanford., PA-C        Physical Findings: The patient is in no acute distress. Patient is alert and oriented.  height is 5' 5.5" (1.664 m) and weight is 192 lb 6 oz (87.3 kg). Her temporal temperature is 96.7 F (35.9 C) (abnormal). Her blood pressure is 148/90 (abnormal) and her pulse is 93. Her respiration is 18 and oxygen saturation is 100%.   Lungs are clear to auscultation bilaterally. Heart has regular rate and rhythm. No palpable cervical, supraclavicular, or axillary adenopathy. Abdomen soft, non-tender, normal bowel sounds. Right breast: No palpable mass, nipple discharge, or bleeding. Left chest wall area shows some mild hyperpigmentation changes.  The skin is well-healed  at this time.  No palpable or visible signs of recurrence.  On pelvic examination no inguinal adenopathy is appreciated.  Examination of the vulvar area reveals no palpable or visible signs of recurrence.  Speculum exam there are no mucosal lesions noted in the vaginal vault.  On bimanual examination there are no pelvic masses appreciated.  Lab Findings: Lab Results  Component Value Date   WBC 6.4 11/10/2020   HGB 11.6 (L) 11/10/2020   HCT 35.0 (L) 11/10/2020   MCV 89.3 11/10/2020   PLT 270 11/10/2020    Radiographic Findings: ECHOCARDIOGRAM COMPLETE  Result Date: 11/18/2020    ECHOCARDIOGRAM REPORT   Patient Name:   Julisa C Mccravy Date of Exam: 11/17/2020 Medical Rec #:  [id number]       Height:       65.5 in Accession #:    [id number]      Weight:       196.0 lb Date of Birth:  1975-10-11  BSA:          1.972 m Patient Age:    45 years        BP:           146/90 mmHg Patient Gender: F               HR:           98 bpm. Exam Location:  Outpatient Procedure: 2D Echo, Cardiac Doppler, Color Doppler and Strain Analysis Indications:    Chemo evaluation  History:        Patient has prior history of Echocardiogram examinations, most                 recent 08/10/2020. Risk Factors:Hypertension and Current Smoker.                 Breast cancer, vulvar cancer, radiation.  Sonographer:    Dustin Flock Referring Phys: [id number] [place]  1. Left ventricular ejection fraction, by estimation, is 60 to 65%. The left ventricle has normal function. The left ventricle has no regional wall motion abnormalities. Left ventricular diastolic parameters were normal.  2. Right ventricular systolic function is normal. The right ventricular size is normal. Tricuspid regurgitation signal is inadequate for assessing PA pressure.  3. The mitral valve is normal in structure. No evidence of mitral valve regurgitation. No evidence of mitral stenosis.  4. The aortic valve is normal in structure. Aortic  valve regurgitation is not visualized. No aortic stenosis is present.  5. The inferior vena cava is normal in size with greater than 50% respiratory variability, suggesting right atrial pressure of 3 mmHg. FINDINGS  Left Ventricle: Left ventricular ejection fraction, by estimation, is 60 to 65%. The left ventricle has normal function. The left ventricle has no regional wall motion abnormalities. Global longitudinal strain performed but not reported based on interpreter judgement due to suboptimal tracking. The left ventricular internal cavity size was normal in size. There is no left ventricular hypertrophy. Left ventricular diastolic parameters were normal. Right Ventricle: The right ventricular size is normal. No increase in right ventricular wall thickness. Right ventricular systolic function is normal. Tricuspid regurgitation signal is inadequate for assessing PA pressure. Left Atrium: Left atrial size was normal in size. Right Atrium: Right atrial size was normal in size. Pericardium: There is no evidence of pericardial effusion. Mitral Valve: The mitral valve is normal in structure. No evidence of mitral valve regurgitation. No evidence of mitral valve stenosis. Tricuspid Valve: The tricuspid valve is normal in structure. Tricuspid valve regurgitation is not demonstrated. No evidence of tricuspid stenosis. Aortic Valve: The aortic valve is normal in structure. Aortic valve regurgitation is not visualized. No aortic stenosis is present. Pulmonic Valve: The pulmonic valve was normal in structure. Pulmonic valve regurgitation is not visualized. No evidence of pulmonic stenosis. Aorta: The aortic root is normal in size and structure. Venous: The inferior vena cava is normal in size with greater than 50% respiratory variability, suggesting right atrial pressure of 3 mmHg. IAS/Shunts: No atrial level shunt detected by color flow Doppler.  LEFT VENTRICLE PLAX 2D LVIDd:         3.70 cm  Diastology LVIDs:         2.70  cm  LV e' medial:    8.27 cm/s LV PW:         1.10 cm  LV E/e' medial:  6.2 LV IVS:        1.30 cm  LV e' lateral:  8.92 cm/s LVOT diam:     2.20 cm  LV E/e' lateral: 5.7 LV SV:         60 LV SV Index:   31 LVOT Area:     3.80 cm  RIGHT VENTRICLE RV Basal diam:  2.70 cm RV S prime:     11.60 cm/s TAPSE (M-mode): 2.0 cm LEFT ATRIUM             Index       RIGHT ATRIUM           Index LA diam:        2.60 cm 1.32 cm/m  RA Area:     12.00 cm LA Vol (A2C):   34.1 ml 17.29 ml/m RA Volume:   26.10 ml  13.23 ml/m LA Vol (A4C):   33.8 ml 17.14 ml/m LA Biplane Vol: 34.1 ml 17.29 ml/m  AORTIC VALVE LVOT Vmax:   88.10 cm/s LVOT Vmean:  55.500 cm/s LVOT VTI:    0.159 m  AORTA Ao Root diam: 2.90 cm MITRAL VALVE MV Area (PHT): 5.66 cm    SHUNTS MV Decel Time: 134 msec    Systemic VTI:  0.16 m MV E velocity: 51.00 cm/s  Systemic Diam: 2.20 cm MV A velocity: 70.30 cm/s MV E/A ratio:  0.73 Cherlynn Kaiser MD Electronically signed by Cherlynn Kaiser MD Signature Date/Time: 11/18/2020/1:11:24 AM    Final    US PELVIC COMPLETE WITH TRANSVAGINAL  Result Date: 11/06/2020 CLINICAL DATA:  Deleterious gene mutation in BRIP with hereditary risk for ovarian cancer. Pelvic pain, vulvar cancer, postmenopausal EXAM: TRANSABDOMINAL AND TRANSVAGINAL ULTRASOUND OF PELVIS TECHNIQUE: Both transabdominal and transvaginal ultrasound examinations of the pelvis were performed. Transabdominal technique was performed for global imaging of the pelvis including uterus, ovaries, adnexal regions, and pelvic cul-de-sac. It was necessary to proceed with endovaginal exam following the transabdominal exam to visualize the endometrium. COMPARISON:  04/25/2016 FINDINGS: Uterus Measurements: 9.1 x 3.3 x 4.3 cm = volume: 67 mL. Anteverted. Normal morphology without mass Endometrium Thickness: 6 mm.  No endometrial fluid or focal abnormality Right ovary Measurements: 3.4 x 1.3 x 1.9 cm = volume: 4.2 mL. Normal morphology without mass Left ovary Measurements:  2.6 x 1.6 x 2.0 cm = volume: 4.3 mL. Normal morphology without mass. Seen only on transabdominal imaging. Other findings No free pelvic fluid.  No adnexal masses. IMPRESSION: Normal exam. Electronically Signed   By: Lavonia Dana M.D.   On: 11/06/2020 10:15    Impression: StageIB(ypT2, ypN1a)Multicentric LeftBreast, Invasive DuctalCarcinoma with DCIS, ER+/ PR+/ Her2+, Grade3  stage IB vulvar cancer s/p definitive radiation, s/p diagnosis of HR positive left breast cancer, and deleterious mutation in BRIP1.   The patient is recovering from the effects of radiation concerning her chest wall radiation therapy.  Fatigue issues linger at this point but no other issues.  Plan: The patient is scheduled to follow up with Dr. Lindi Adie on 12/01/2020 and with Dr. Denman George on 02/19/2021. She will follow up with radiation oncology in 6 months for follow-up of her vulvar carcinoma.    ____________________________________   Blair Promise, PhD, MD  This document serves as a record of services personally performed by Gery Pray, MD. It was created on his behalf by Clerance Lav, a trained medical scribe. The creation of this record is based on the scribe's personal observations and the provider's statements to them. This document has been checked and approved by the attending provider.

## 2020-11-23 NOTE — Telephone Encounter (Signed)
Received call from pt stating GI wants to restart her on Neurontin.  Pt states in October 2021 Neurontin was d/c'd due to high AST and ALT.  Pt requesting advice from MD if okay to proceed with Neurontin.  Per MD okay for pt to resume tx, will monitor labs closely. Pt verbalized understanding.

## 2020-11-23 NOTE — Telephone Encounter (Signed)
Pt called to get your advice regarding GI putting her on Neurontin 300 mg qd. Pt just wants to make sure it's ok because she says you were hesitant in the past. Please review and advise.

## 2020-11-24 ENCOUNTER — Ambulatory Visit: Payer: Self-pay | Admitting: Physician Assistant

## 2020-11-24 NOTE — Telephone Encounter (Signed)
She tried in the past but stopped because of weight gain.  If she started then she need to watch her weight.

## 2020-11-30 NOTE — Assessment & Plan Note (Signed)
Breast cancer and vulvar cancers  02/20/2020:PET scan on 02/07/20 following a diagnosis of vulvar cancer showed a 1.7cm left breast mass and mildly hypermetabolic left axillary lymph nodes. Mammogram and [place] on 02/18/20 showed a 3.1cm left breast mass at the 11 o'clock position, palpable on exam, and a single abnormal left axillary lymph node with cortical thickening. Left breast biopsy on 02/20/20 showed invasive and in situ carcinoma in the breast and axilla, grade 3, HER-2 positive (3+), ER/PR+ 95%, Ki67 75%  Treatment plan: 1.Neoadjuvant chemoradiation for vulvar cancer with Taxol, carboplatin, Herceptin weekly x6 cycles 2.neoadjuvant chemotherapy with TCH Perjeta x3 cycles 3.breast conserving surgery if possible with sentinel lymph node study 4. Followed by adjuvant radiation therapyto be completed on 10/23/20 5.Kadcyla maintenance 6.Adjuvant antiestrogen therapy 7.Adjuvant neratinib ------------------------------------------------------------------------------------------------------------------- 07/27/20:Left mastectomy [name]): three foci of IDC, 3.7cm, 1.5cm, and 1.2cm, with intermediate to high grade DCIS, clear margins, with 1/2 left axillary lymph nodes positive for carcinoma.  Current treatment: Todayiscycle5Kadcyla Kadcyla toxicities:Denies any adverse effects to Kadcyla. Mild elevation of AST and ALT: Will be monitored.ASt 41 and ALT 46  Echocardiogram12/02/2020: EF 65 to 70%  Pelvic pressure/ cramps: Could be due to colitis.   FSH: 68.4 and estradiol: 2.5 Based on above patient is in menopause  Return to clinic every 3 weeks for Kadcyla and start antiestrogen therapy.

## 2020-11-30 NOTE — Progress Notes (Signed)
Patient Care Team: Nicholas Lose, MD as PCP - General (Hematology and Oncology) Mauro Kaufmann, RN as Oncology Nurse Navigator Rockwell Germany, RN as Oncology Nurse Navigator Nicholas Lose, MD as Consulting Physician (Hematology and Oncology) Alphonsa Overall, MD as Consulting Physician (General Surgery) Awanda Mink Craige Cotta, RN as Oncology Nurse Navigator (Oncology)  DIAGNOSIS:    ICD-10-CM   1. Malignant neoplasm of upper-inner quadrant of left breast in female, estrogen receptor positive ([place])  C50.212    Z17.0     SUMMARY OF ONCOLOGIC HISTORY: Oncology History  Vulvar cancer ([place])  01/29/2020 Initial Diagnosis   Vulvar cancer ([place])   03/11/2020 - 05/04/2020 Chemotherapy   The patient had dexamethasone (DECADRON) 4 MG tablet, 4 mg (100 % of original dose 4 mg), Oral, Daily, 1 of 1 cycle, Start date: 02/27/2020, End date: 05/05/2020 Dose modification: 4 mg (original dose 4 mg, Cycle 0) palonosetron (ALOXI) injection 0.25 mg, 0.25 mg, Intravenous,  Once, 1 of 1 cycle Administration: 0.25 mg (03/11/2020), 0.25 mg (03/17/2020), 0.25 mg (03/24/2020), 0.25 mg (03/31/2020), 0.25 mg (04/07/2020), 0.25 mg (04/14/2020), 0.25 mg (04/21/2020) CARBOplatin (PARAPLATIN) 300 mg in sodium chloride 0.9 % 250 mL chemo infusion, 300 mg (100 % of original dose 300 mg), Intravenous,  Once, 1 of 1 cycle Dose modification:   (original dose 300 mg, Cycle 1) Administration: 300 mg (03/11/2020), 290 mg (03/17/2020), 300 mg (03/24/2020), 230 mg (03/31/2020), 230 mg (04/07/2020), 230 mg (04/14/2020) PACLitaxel (TAXOL) 108 mg in sodium chloride 0.9 % 250 mL chemo infusion (</= 85m/m2), 50 mg/m2 = 108 mg, Intravenous,  Once, 1 of 1 cycle Dose modification: 45 mg/m2 (original dose 50 mg/m2, Cycle 1, Reason: Dose not tolerated) Administration: 108 mg (03/11/2020), 108 mg (03/17/2020), 108 mg (03/24/2020), 96 mg (03/31/2020), 96 mg (04/07/2020), 96 mg (04/14/2020) fosaprepitant (EMEND) 150 mg in sodium chloride 0.9 % 145 mL IVPB, 150 mg,  Intravenous,  Once, 1 of 1 cycle Administration: 150 mg (03/24/2020), 150 mg (03/31/2020), 150 mg (04/07/2020), 150 mg (04/14/2020) trastuzumab-anns (KANJINTI) 378 mg in sodium chloride 0.9 % 250 mL chemo infusion, 4 mg/kg = 378 mg (100 % of original dose 4 mg/kg), Intravenous,  Once, 1 of 1 cycle Dose modification: 4 mg/kg (original dose 4 mg/kg, Cycle 1, Reason: Other (see comments), Comment: loading dose) Administration: 378 mg (03/11/2020), 189 mg (03/17/2020), 189 mg (03/24/2020), 189 mg (03/31/2020), 189 mg (04/07/2020), 189 mg (04/14/2020), 189 mg (04/21/2020), 189 mg (05/04/2020)  for chemotherapy treatment.    Malignant neoplasm of upper-inner quadrant of left breast in female, estrogen receptor positive ([place]  02/20/2020 Initial Diagnosis   PET scan on 02/07/20 following a diagnosis of vulvar cancer showed a 1.7cm left breast mass and mildly hypermetabolic left axillary lymph nodes. [place]ammogram and [place] showed a 3.1cm left breast mass at the 11 o'clock position, palpable on exam, and a single abnormal left axillary lymph node with cortical thickening. Left breast biopsy on 02/20/20 showed invasive and in situ carcinoma in the breast and axilla, grade 3, HER-2 positive (3+), ER/PR+ 95%, Ki67 75%   02/25/2020 Cancer Staging   Staging form: Breast, AJCC 8th Edition - Clinical stage from 02/25/2020: Stage IB (cT2, cN1, cM0, G3, ER+, PR+, HER2+) - Signed by GNicholas Lose MD on 02/25/2020   05/29/2020 - 05/29/2020 Chemotherapy   The patient had palonosetron (ALOXI) injection 0.25 mg, 0.25 mg, Intravenous,  Once, 1 of 3 cycles Administration: 0.25 mg (05/29/2020) pegfilgrastim-jmdb (FULPHILA) injection 6 mg, 6 mg, Subcutaneous,  Once, 1 of 3  cycles CARBOplatin (PARAPLATIN) 580 mg in sodium chloride 0.9 % 250 mL chemo infusion, 580 mg (100 % of original dose 580 mg), Intravenous,  Once, 1 of 3 cycles Dose modification:   (original dose 580 mg, Cycle 1) Administration: 580 mg (05/29/2020) DOCEtaxel (TAXOTERE)  120 mg in sodium chloride 0.9 % 250 mL chemo infusion, 60 mg/m2 = 120 mg (100 % of original dose 60 mg/m2), Intravenous,  Once, 1 of 3 cycles Dose modification: 60 mg/m2 (original dose 60 mg/m2, Cycle 1, Reason: Provider Judgment), 50 mg/m2 (original dose 60 mg/m2, Cycle 2, Reason: Dose not tolerated) Administration: 120 mg (05/29/2020) fosaprepitant (EMEND) 150 mg in sodium chloride 0.9 % 145 mL IVPB, 150 mg, Intravenous,  Once, 1 of 3 cycles Administration: 150 mg (05/29/2020) pertuzumab (PERJETA) 420 mg in sodium chloride 0.9 % 250 mL chemo infusion, 420 mg (50 % of original dose 840 mg), Intravenous, Once, 1 of 3 cycles Dose modification: 420 mg (original dose 840 mg, Cycle 1, Reason: Provider Judgment) Administration: 420 mg (05/29/2020) trastuzumab-dkst (OGIVRI) 525 mg in sodium chloride 0.9 % 250 mL chemo infusion, 6 mg/kg = 525 mg (75 % of original dose 8 mg/kg), Intravenous,  Once, 1 of 3 cycles Dose modification: 6 mg/kg (original dose 8 mg/kg, Cycle 1, Reason: Provider Judgment) Administration: 525 mg (05/29/2020)  for chemotherapy treatment.    06/22/2020 Genetic Testing   Positive genetic testing: Heterozygous pathogenic variant detected in BRIP1 gene at c.2010dup (p.Glu671*).  Variant of uncertain significance detected in POLD1 at c.532G>A (p.Gly178Arg).  No other pathogenic variants detected in [facility] Common Hereditary Cancers Panel.  The report date is June 22, 2020.   The Common Hereditary Cancers Panel offered by [facility] includes sequencing and/or deletion duplication testing of the following 48 genes: APC, ATM, AXIN2, BARD1, BMPR1A, BRCA1, BRCA2, BRIP1, CDH1, CDK4, CDKN2A (p14ARF), CDKN2A (p16INK4a), CHEK2, CTNNA1, DICER1, EPCAM (Deletion/duplication testing only), GREM1 (promoter region deletion/duplication testing only), KIT, MEN1, MLH1, MSH2, MSH3, MSH6, MUTYH, NBN, NF1, NHTL1, PALB2, PDGFRA, PMS2, POLD1, POLE, PTEN, RAD50, RAD51C, RAD51D, RNF43, SDHB, SDHC, SDHD, SMAD4,  SMARCA4. STK11, TP53, TSC1, TSC2, and VHL.  The following genes were evaluated for sequence changes only: SDHA and HOXB13 c.251G>A variant only.   07/27/2020 Surgery   Left mastectomy [name]): three foci of IDC, 3.7cm, 1.5cm, and 1.2cm, with intermediate to high grade DCIS, clear margins, with 1/2 left axillary lymph nodes positive for carcinoma.    08/18/2020 -  Chemotherapy    Patient is on Treatment Plan: BREAST ADO-TRASTUZUMAB EMTANSINE ([place]) Q21D      09/09/2020 - 10/23/2020 Radiation Therapy   Adjuvant radiation     CHIEF COMPLIANT: Sandra Miller maintenance  INTERVAL HISTORY: BRYNJA MARKER is a 45 y.o. with above-mentioned history of left breast cancer and vulvarcancerwho completedneoadjuvant chemotherapy,underwent a left mastectomy, completed radiation on 10/23/20, and is currently on Sandra Miller maintenance.She also has a history of DVT currently on Xarelto.Echo on 11/17/20 showed an ejection fraction of 60-65%. She presents to the clinic todayfortreatment.   ALLERGIES:  is allergic to dilaudid [hydromorphone hcl], depakote [divalproex sodium], minocycline, and aspirin.  MEDICATIONS:  Current Outpatient Medications  Medication Sig Dispense Refill  . acetaminophen (TYLENOL) 500 MG tablet Take 2 tablets (1,000 mg total) by mouth every 6 (six) hours as needed for moderate pain or fever. Avoid tylenol until your liver function back to normal 30 tablet 0  . Cholecalciferol (D3-1000 PO) Take 1 tablet by mouth daily.    . diphenoxylate-atropine (LOMOTIL) 2.5-0.025 MG tablet TAKE 1 TO 2 TABLETS  BY MOUTH FOUR TIMES DAILY AS NEEDED FOR DIARRHEA (Patient taking differently: Take 1-2 tablets by mouth 4 (four) times daily as needed for diarrhea or loose stools. TAKE 1 TO 2 TABLETS BY MOUTH FOUR TIMES DAILY AS NEEDED FOR DIARRHEA) 45 tablet 1  . doxylamine, Sleep, (UNISOM) 25 MG tablet Take 25 mg by mouth as needed.    . dronabinol (MARINOL) 2.5 MG capsule Take 1 capsule (2.5 mg total) by  mouth 2 (two) times daily before a meal. 60 capsule 0  . gabapentin (NEURONTIN) 300 MG capsule Take 1 capsule (300 mg total) by mouth daily. 30 capsule 5  . lidocaine-prilocaine (EMLA) cream Apply to affected area once 30 g 3  . lisinopril (ZESTRIL) 20 MG tablet Take 0.5 tablets (10 mg total) by mouth daily. 90 tablet 3  . LORazepam (ATIVAN) 1 MG tablet Take 1 tablet (1 mg total) by mouth 3 (three) times daily as needed for anxiety. 90 tablet 2  . magnesium oxide (MAG-OX) 400 (241.3 Mg) MG tablet Take 1 tablet (400 mg total) by mouth 2 (two) times daily. 60 tablet 1  . Multiple Vitamin (MULTIVITAMIN WITH MINERALS) TABS tablet Take 1 tablet by mouth daily.    . nicotine (NICODERM CQ - DOSED IN MG/24 HOURS) 14 mg/24hr patch Place 1 patch (14 mg total) onto the skin daily. 28 patch 0  . OLANZapine (ZYPREXA) 10 MG tablet Take 1 tablet (10 mg total) by mouth at bedtime. 90 tablet 0  . omeprazole (PRILOSEC) 40 MG capsule Take 1 capsule (40 mg total) by mouth in the morning and at bedtime. 60 capsule 3  . potassium chloride SA (KLOR-CON) 20 MEQ tablet TAKE 1 TABLET BY MOUTH TWICE DAILY 60 tablet 2  . promethazine (PHENERGAN) 25 MG tablet Take 1 tablet (25 mg total) by mouth every 6 (six) hours as needed for nausea. 30 tablet 3  . rivaroxaban (XARELTO) 20 MG TABS tablet Take 1 tablet (20 mg total) by mouth daily with supper. 30 tablet 6  . rosuvastatin (CRESTOR) 10 MG tablet Take 1 tablet (10 mg total) by mouth daily. 90 tablet 3  . venlafaxine XR (EFFEXOR XR) 37.5 MG 24 hr capsule Take three capsule (112.5 mg total) by mouth daily. 90 capsule 2   No current facility-administered medications for this visit.   Facility-Administered Medications Ordered in Other Visits  Medication Dose Route Frequency Provider Last Rate Last Admin  . LORazepam (ATIVAN) injection 0.5 mg  0.5 mg Intravenous Once Harle Stanford., PA-C        PHYSICAL EXAMINATION: ECOG PERFORMANCE STATUS: 1 - Symptomatic but completely  ambulatory  There were no vitals filed for this visit. There were no vitals filed for this visit.  LABORATORY DATA:  I have reviewed the data as listed CMP Latest Ref Rng & Units 11/10/2020 10/20/2020 10/20/2020  Glucose 70 - 99 mg/dL 92 89 -  BUN 6 - 20 mg/dL 13 13 -  Creatinine 0.44 - 1.00 mg/dL 0.82 0.81 -  Sodium 135 - 145 mmol/L 138 139 -  Potassium 3.5 - 5.1 mmol/L 3.5 3.7 -  Chloride 98 - 111 mmol/L 105 102 -  CO2 22 - 32 mmol/L 25 27 -  Calcium 8.9 - 10.3 mg/dL 9.7 9.8 9.8  Total Protein 6.5 - 8.1 g/dL 7.6 7.7 -  Total Bilirubin 0.3 - 1.2 mg/dL 0.3 0.5 -  Alkaline Phos 38 - 126 U/L 59 49 -  AST 15 - 41 U/L 29 29 -  ALT 0 -  44 U/L 25 27 -    Lab Results  Component Value Date   WBC 6.4 11/10/2020   HGB 11.6 (L) 11/10/2020   HCT 35.0 (L) 11/10/2020   MCV 89.3 11/10/2020   PLT 270 11/10/2020   NEUTROABS 4.4 11/10/2020    ASSESSMENT & PLAN:  Malignant neoplasm of upper-inner quadrant of left breast in female, estrogen receptor positive ([place]) Breast cancer and vulvar cancers  02/20/2020:PET scan on 02/07/20 following a diagnosis of vulvar cancer showed a 1.7cm left breast mass and mildly hypermetabolic left axillary lymph nodes. Mammogram and [place] on 02/18/20 showed a 3.1cm left breast mass at the 11 o'clock position, palpable on exam, and a single abnormal left axillary lymph node with cortical thickening. Left breast biopsy on 02/20/20 showed invasive and in situ carcinoma in the breast and axilla, grade 3, HER-2 positive (3+), ER/PR+ 95%, Ki67 75%  Treatment plan: 1.Neoadjuvant chemoradiation for vulvar cancer with Taxol, carboplatin, Herceptin weekly x6 cycles 2.neoadjuvant chemotherapy with TCH Perjeta x3 cycles 3.07/27/20:Left mastectomy [name]): three foci of IDC, 3.7cm, 1.5cm, and 1.2cm, with intermediate to high grade DCIS, clear margins, with 1/2 left axillary lymph nodes positive for carcinoma. 4. Followed by adjuvant radiation therapyto be completed on  10/23/20 5.Sandra Miller maintenance to be completed 04/06/2021 6.Adjuvant antiestrogen therapy with anastrozole started 12/01/2020 7.Adjuvant neratinib -------------------------------------------------------------------------------------------------------------------   Current treatment: Todayiscycle5Kadcyla Sandra Miller toxicities:Denies any adverse effects to Sandra Miller.   Echocardiogram3/16/2022: EF 60 to 65%  Pelvic pressure/ cramps: Could be due to colitis.  Prescribed gabapentin by gastroenterology.   FSH: 68.4 and estradiol: 2.5 Based on above patient is in menopause  start antiestrogen therapy with anastrozole 1 mg daily. Anastrozole counseling: We discussed the risks and benefits of anti-estrogen therapy with aromatase inhibitors. These include but not limited to insomnia, hot flashes, mood changes, vaginal dryness, bone density loss, and weight gain. We strongly believe that the benefits far outweigh the risks. Patient understands these risks and consented to starting treatment. Planned treatment duration is 7 years.  Return to clinic every 3 weeks for Sandra Miller and to assess tolerance to anastrozole therapy  No orders of the defined types were placed in this encounter.  The patient has a good understanding of the overall plan. she agrees with it. she will call with any problems that may develop before the next visit here.  Total time spent: 30 mins including face to face time and time spent for planning, charting and coordination of care  Rulon Eisenmenger, MD, MPH 12/01/2020  I, Cloyde Reams Dorshimer, am acting as scribe for Dr. Nicholas Lose.  I have reviewed the above documentation for accuracy and completeness, and I agree with the above.

## 2020-12-01 ENCOUNTER — Other Ambulatory Visit: Payer: Self-pay | Admitting: Hematology and Oncology

## 2020-12-01 ENCOUNTER — Inpatient Hospital Stay: Payer: Self-pay

## 2020-12-01 ENCOUNTER — Other Ambulatory Visit: Payer: Self-pay

## 2020-12-01 ENCOUNTER — Encounter: Payer: Self-pay | Admitting: *Deleted

## 2020-12-01 ENCOUNTER — Inpatient Hospital Stay (HOSPITAL_BASED_OUTPATIENT_CLINIC_OR_DEPARTMENT_OTHER): Payer: Self-pay | Admitting: Hematology and Oncology

## 2020-12-01 VITALS — BP 113/76 | HR 84 | Temp 98.5°F

## 2020-12-01 DIAGNOSIS — Z170 Estrogen receptor positive status [ER+]: Secondary | ICD-10-CM

## 2020-12-01 DIAGNOSIS — C50212 Malignant neoplasm of upper-inner quadrant of left female breast: Secondary | ICD-10-CM

## 2020-12-01 DIAGNOSIS — Z95828 Presence of other vascular implants and grafts: Secondary | ICD-10-CM

## 2020-12-01 LAB — CBC WITH DIFFERENTIAL (CANCER CENTER ONLY)
Abs Immature Granulocytes: 0.02 10*3/uL (ref 0.00–0.07)
Basophils Absolute: 0 10*3/uL (ref 0.0–0.1)
Basophils Relative: 1 %
Eosinophils Absolute: 0.2 10*3/uL (ref 0.0–0.5)
Eosinophils Relative: 4 %
HCT: 33.9 % — ABNORMAL LOW (ref 36.0–46.0)
Hemoglobin: 11.4 g/dL — ABNORMAL LOW (ref 12.0–15.0)
Immature Granulocytes: 0 %
Lymphocytes Relative: 24 %
Lymphs Abs: 1.3 10*3/uL (ref 0.7–4.0)
MCH: 30.1 pg (ref 26.0–34.0)
MCHC: 33.6 g/dL (ref 30.0–36.0)
MCV: 89.4 fL (ref 80.0–100.0)
Monocytes Absolute: 0.5 10*3/uL (ref 0.1–1.0)
Monocytes Relative: 9 %
Neutro Abs: 3.3 10*3/uL (ref 1.7–7.7)
Neutrophils Relative %: 62 %
Platelet Count: 279 10*3/uL (ref 150–400)
RBC: 3.79 MIL/uL — ABNORMAL LOW (ref 3.87–5.11)
RDW: 14.7 % (ref 11.5–15.5)
WBC Count: 5.3 10*3/uL (ref 4.0–10.5)
nRBC: 0 % (ref 0.0–0.2)

## 2020-12-01 LAB — CMP (CANCER CENTER ONLY)
ALT: 24 U/L (ref 0–44)
AST: 30 U/L (ref 15–41)
Albumin: 3.9 g/dL (ref 3.5–5.0)
Alkaline Phosphatase: 57 U/L (ref 38–126)
Anion gap: 9 (ref 5–15)
BUN: 14 mg/dL (ref 6–20)
CO2: 25 mmol/L (ref 22–32)
Calcium: 9 mg/dL (ref 8.9–10.3)
Chloride: 106 mmol/L (ref 98–111)
Creatinine: 0.77 mg/dL (ref 0.44–1.00)
GFR, Estimated: >60 mL/min (ref >60)
Glucose, Bld: 86 mg/dL (ref 70–99)
Potassium: 4.1 mmol/L (ref 3.5–5.1)
Sodium: 140 mmol/L (ref 135–145)
Total Bilirubin: 0.3 mg/dL (ref 0.3–1.2)
Total Protein: 7.1 g/dL (ref 6.5–8.1)

## 2020-12-01 MED ORDER — SODIUM CHLORIDE 0.9 % IV SOLN
Freq: Once | INTRAVENOUS | Status: AC
  Filled 2020-12-01: qty 250

## 2020-12-01 MED ORDER — DIPHENHYDRAMINE HCL 25 MG PO CAPS
50.0000 mg | ORAL_CAPSULE | Freq: Once | ORAL | Status: AC
  Administered 2020-12-01: 50 mg via ORAL

## 2020-12-01 MED ORDER — ACETAMINOPHEN 325 MG PO TABS
ORAL_TABLET | ORAL | Status: AC
  Filled 2020-12-01: qty 2

## 2020-12-01 MED ORDER — ACETAMINOPHEN 325 MG PO TABS
650.0000 mg | ORAL_TABLET | Freq: Once | ORAL | Status: AC
  Administered 2020-12-01: 650 mg via ORAL

## 2020-12-01 MED ORDER — DIPHENHYDRAMINE HCL 25 MG PO CAPS
ORAL_CAPSULE | ORAL | Status: AC
  Filled 2020-12-01: qty 2

## 2020-12-01 MED ORDER — HEPARIN SOD (PORK) LOCK FLUSH 100 UNIT/ML IV SOLN
500.0000 [IU] | Freq: Once | INTRAVENOUS | Status: AC | PRN
  Administered 2020-12-01: 500 [IU]
  Filled 2020-12-01: qty 5

## 2020-12-01 MED ORDER — SODIUM CHLORIDE 0.9% FLUSH
10.0000 mL | INTRAVENOUS | Status: DC | PRN
  Administered 2020-12-01: 10 mL
  Filled 2020-12-01: qty 10

## 2020-12-01 MED ORDER — SODIUM CHLORIDE 0.9% FLUSH
10.0000 mL | Freq: Once | INTRAVENOUS | Status: AC
  Administered 2020-12-01: 10 mL
  Filled 2020-12-01: qty 10

## 2020-12-01 MED ORDER — SODIUM CHLORIDE 0.9 % IV SOLN
3.6000 mg/kg | Freq: Once | INTRAVENOUS | Status: AC
  Administered 2020-12-01: 300 mg via INTRAVENOUS
  Filled 2020-12-01: qty 15

## 2020-12-01 MED ORDER — ANASTROZOLE 1 MG PO TABS: 1.0000 mg | ORAL_TABLET | Freq: Every day | ORAL | 3 refills | Status: DC

## 2020-12-01 MED ORDER — NICOTINE 14 MG/24HR TD PT24: 14.0000 mg | MEDICATED_PATCH | Freq: Every day | TRANSDERMAL | 1 refills | Status: DC

## 2020-12-01 MED FILL — ANASTROZOLE 1 MG TABLET: 1 | 90 days supply | Qty: 90 | Fill #0

## 2020-12-01 MED FILL — NICOTINE 14 MG/24HR PATCH: 14 | 28 days supply | Qty: 28 | Fill #0

## 2020-12-01 NOTE — Patient Instructions (Signed)
[facility] Discharge Instructions for Patients Receiving Chemotherapy  Today you received the following chemotherapy agents ado-traztuzumab (Kadcyla)  To help prevent nausea and vomiting after your treatment, we encourage you to take your nausea medication as directed.    If you develop nausea and vomiting that is not controlled by your nausea medication, call the clinic.   BELOW ARE SYMPTOMS THAT SHOULD BE REPORTED IMMEDIATELY:  *FEVER GREATER THAN 100.5 F  *CHILLS WITH OR WITHOUT FEVER  NAUSEA AND VOMITING THAT IS NOT CONTROLLED WITH YOUR NAUSEA MEDICATION  *UNUSUAL SHORTNESS OF BREATH  *UNUSUAL BRUISING OR BLEEDING  TENDERNESS IN MOUTH AND THROAT WITH OR WITHOUT PRESENCE OF ULCERS  *URINARY PROBLEMS  *BOWEL PROBLEMS  UNUSUAL RASH Items with * indicate a potential emergency and should be followed up as soon as possible.  Feel free to call the clinic should you have any questions or concerns. The clinic phone number is [phone].  Please show the [place] at check-in to the Emergency Department and triage nurse.

## 2020-12-01 NOTE — Patient Instructions (Signed)
Implanted Port Insertion, Care After This sheet gives you information about how to care for yourself after your procedure. Your health care provider may also give you more specific instructions. If you have problems or questions, contact your health care provider. What can I expect after the procedure? After the procedure, it is common to have:  Discomfort at the port insertion site.  Bruising on the skin over the port. This should improve over 3-4 days. Follow these instructions at home: Port care  After your port is placed, you will get a manufacturer's information card. The card has information about your port. Keep this card with you at all times.  Take care of the port as told by your health care provider. Ask your health care provider if you or a family member can get training for taking care of the port at home. A home health care nurse may also take care of the port.  Make sure to remember what type of port you have. Incision care  Follow instructions from your health care provider about how to take care of your port insertion site. Make sure you: ? Wash your hands with soap and water before and after you change your bandage (dressing). If soap and water are not available, use hand sanitizer. ? Change your dressing as told by your health care provider. ? Leave stitches (sutures), skin glue, or adhesive strips in place. These skin closures may need to stay in place for 2 weeks or longer. If adhesive strip edges start to loosen and curl up, you may trim the loose edges. Do not remove adhesive strips completely unless your health care provider tells you to do that.  Check your port insertion site every day for signs of infection. Check for: ? Redness, swelling, or pain. ? Fluid or blood. ? Warmth. ? Pus or a bad smell.      Activity  Return to your normal activities as told by your health care provider. Ask your health care provider what activities are safe for you.  Do not  lift anything that is heavier than 10 lb (4.5 kg), or the limit that you are told, until your health care provider says that it is safe. General instructions  Take over-the-counter and prescription medicines only as told by your health care provider.  Do not take baths, swim, or use a hot tub until your health care provider approves. Ask your health care provider if you may take showers. You may only be allowed to take sponge baths.  Do not drive for 24 hours if you were given a sedative during your procedure.  Wear a medical alert bracelet in case of an emergency. This will tell any health care providers that you have a port.  Keep all follow-up visits as told by your health care provider. This is important. Contact a health care provider if:  You cannot flush your port with saline as directed, or you cannot draw blood from the port.  You have a fever or chills.  You have redness, swelling, or pain around your port insertion site.  You have fluid or blood coming from your port insertion site.  Your port insertion site feels warm to the touch.  You have pus or a bad smell coming from the port insertion site. Get help right away if:  You have chest pain or shortness of breath.  You have bleeding from your port that you cannot control. Summary  Take care of the port as told by your   health care provider. Keep the manufacturer's information card with you at all times.  Change your dressing as told by your health care provider.  Contact a health care provider if you have a fever or chills or if you have redness, swelling, or pain around your port insertion site.  Keep all follow-up visits as told by your health care provider. This information is not intended to replace advice given to you by your health care provider. Make sure you discuss any questions you have with your health care provider. Document Revised: 03/20/2018 Document Reviewed: 03/20/2018 Elsevier Patient Education   2021 Elsevier Inc.  

## 2020-12-02 ENCOUNTER — Telehealth: Payer: Self-pay | Admitting: Hematology and Oncology

## 2020-12-02 NOTE — Telephone Encounter (Signed)
Scheduled per 3/29 los. Pt will receive an updated appt calendar per next visit appt notes  

## 2020-12-07 ENCOUNTER — Other Ambulatory Visit (HOSPITAL_COMMUNITY): Payer: Self-pay

## 2020-12-07 MED FILL — Rivaroxaban Tab 20 MG: ORAL | 30 days supply | Qty: 30 | Fill #0 | Status: AC

## 2020-12-11 ENCOUNTER — Other Ambulatory Visit (HOSPITAL_COMMUNITY): Payer: Self-pay

## 2020-12-17 ENCOUNTER — Encounter (HOSPITAL_COMMUNITY): Payer: Self-pay | Admitting: Psychiatry

## 2020-12-17 ENCOUNTER — Other Ambulatory Visit (HOSPITAL_COMMUNITY): Payer: Self-pay

## 2020-12-17 ENCOUNTER — Other Ambulatory Visit: Payer: Self-pay

## 2020-12-17 ENCOUNTER — Telehealth (INDEPENDENT_AMBULATORY_CARE_PROVIDER_SITE_OTHER): Payer: Self-pay | Admitting: Psychiatry

## 2020-12-17 DIAGNOSIS — F319 Bipolar disorder, unspecified: Secondary | ICD-10-CM

## 2020-12-17 DIAGNOSIS — F411 Generalized anxiety disorder: Secondary | ICD-10-CM

## 2020-12-17 MED ORDER — LORAZEPAM 1 MG PO TABS
1.0000 mg | ORAL_TABLET | Freq: Two times a day (BID) | ORAL | 2 refills | Status: DC | PRN
  Filled 2020-12-17: qty 60, 30d supply, fill #0
  Filled 2021-01-27: qty 60, 30d supply, fill #1
  Filled 2021-02-26: qty 60, 30d supply, fill #2

## 2020-12-17 MED ORDER — VENLAFAXINE HCL ER 37.5 MG PO CP24
ORAL_CAPSULE | ORAL | 2 refills | Status: DC
  Filled 2020-12-17: qty 90, 30d supply, fill #0
  Filled 2021-01-27: qty 90, 30d supply, fill #1
  Filled 2021-02-26: qty 90, 30d supply, fill #2

## 2020-12-17 MED ORDER — OLANZAPINE 10 MG PO TABS
ORAL_TABLET | Freq: Every day | ORAL | 0 refills | Status: DC
  Filled 2020-12-17: qty 90, 90d supply, fill #0

## 2020-12-17 NOTE — Progress Notes (Signed)
Virtual Visit via Telephone Note  I connected with Robinson on 12/17/20 at  9:00 AM EDT by telephone and verified that I am speaking with the correct person using two identifiers.  Location: Patient: home Provider: home office   I discussed the limitations, risks, security and privacy concerns of performing an evaluation and management service by telephone and the availability of in person appointments. I also discussed with the patient that there may be a patient responsible charge related to this service. The patient expressed understanding and agreed to proceed.   History of Present Illness: Patient is evaluated by phone session.  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  She started first shift but decided to change to shift at night.  So far she is handling her job and there has been no issue.  It is actually helping her anxiety, depression because she keep herself busy.  Sometimes she struggle with sleeping too much during the day which she believes because of working at night.  She is also started gabapentin from GI to help the nausea and that also helping her anxiety.  She had a very good support from her family member.  She talked to her daughter and granddaughter every day.  Her son is moving to [place] but she is still keep in touch with him regularly.  Her oncologist switch her chemotherapy medicine and she has normal nausea, fatigue as much.  Her last blood work shows BUN 14 and creatinine 0.77.  Her hemoglobin 11.4 and RBC 3.79.  She still have low magnesium.  Overall she noticed her fatigue and energy level is not as concerning.  She denies any anger, mania, hallucination, suicidal thoughts or any frustration.  She understand that she need to take the medicine for her cancer rest of her life.  She is very hopeful and resilient.  So far she is tolerating medication and reported no tremors shakes or any EPS.   She gained 3 pounds since the last visit.   She denies any anxiety or any panic attack.  She denies any crying spells or any suicidal thoughts.   Past Psychiatric History: H/Obipolar disorder, alcohol dependence and marijuana abuse. H/Oat least 6inpatient.Triedlithium, Seroquel, Lamictal, Paxil, Celexa, Abilify, Geodon,Klonopin,Depakote, Neurontin and Cymbalta. No H/ohistory of suicidal attempt.  Psychiatric Specialty Exam: Physical Exam  Review of Systems  Weight 193 lb (87.5 kg).There is no height or weight on file to calculate BMI.  General Appearance: NA  Eye Contact:  NA  Speech:  Slow  Volume:  Normal  Mood:  Anxious  Affect:  NA  Thought Process:  Goal Directed  Orientation:  Full (Time, Place, and Person)  Thought Content:  Logical  Suicidal Thoughts:  No  Homicidal Thoughts:  No  Memory:  Immediate;   Good Recent;   Good Remote;   Good  Judgement:  Intact  Insight:  Present  Psychomotor Activity:  NA  Concentration:  Concentration: Good and Attention Span: Good  Recall:  Good  Fund of Knowledge:  Good  Language:  Good  Akathisia:  No  Handed:  Right  AIMS (if indicated):     Assets:  Communication Skills Desire for Improvement Housing Resilience Social Support Transportation  ADL's:  Intact  Cognition:  WNL  Sleep:   ok     Assessment and Plan: Bipolar disorder type I.  Generalized anxiety disorder.  Patient doing much better  since the last visit.  Her fatigue and energy level is not as much concern.  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  We talked about current medication.  She started gabapentin from her GI to help with nausea and it is also helping her anxiety.  I recommend to cut down the Ativan 1 mg from 3 times a day to twice a day as she is sleeping too much during the day.  She is trying to adjust her sleep cycle due to second shift work.  Recommend to continue venlafaxine 112.5 mg daily and  olanzapine 10 mg at bedtime.  She has not started therapy and does not feel she needed since she is busy at work.  I recommended to call us back if she is any question of any concern.  Follow-up in 3 months.  Follow Up Instructions:    I discussed the assessment and treatment plan with the patient. The patient was provided an opportunity to ask questions and all were answered. The patient agreed with the plan and demonstrated an understanding of the instructions.   The patient was advised to call back or seek an in-person evaluation if the symptoms worsen or if the condition fails to improve as anticipated.  I provided 18 minutes of non-face-to-face time during this encounter.   Kathlee Nations, MD

## 2020-12-18 ENCOUNTER — Other Ambulatory Visit (HOSPITAL_COMMUNITY): Payer: Self-pay

## 2020-12-19 ENCOUNTER — Other Ambulatory Visit (HOSPITAL_COMMUNITY): Payer: Self-pay

## 2020-12-21 ENCOUNTER — Encounter: Payer: Self-pay | Admitting: *Deleted

## 2020-12-22 ENCOUNTER — Inpatient Hospital Stay: Payer: Self-pay | Attending: Gynecologic Oncology

## 2020-12-22 ENCOUNTER — Inpatient Hospital Stay: Payer: Self-pay

## 2020-12-22 ENCOUNTER — Other Ambulatory Visit: Payer: Self-pay

## 2020-12-22 VITALS — BP 115/68 | HR 81 | Temp 98.5°F | Resp 16 | Wt 193.8 lb

## 2020-12-22 DIAGNOSIS — Z5112 Encounter for antineoplastic immunotherapy: Secondary | ICD-10-CM | POA: Insufficient documentation

## 2020-12-22 DIAGNOSIS — C50212 Malignant neoplasm of upper-inner quadrant of left female breast: Secondary | ICD-10-CM | POA: Insufficient documentation

## 2020-12-22 DIAGNOSIS — Z170 Estrogen receptor positive status [ER+]: Secondary | ICD-10-CM

## 2020-12-22 DIAGNOSIS — C519 Malignant neoplasm of vulva, unspecified: Secondary | ICD-10-CM | POA: Insufficient documentation

## 2020-12-22 DIAGNOSIS — Z79899 Other long term (current) drug therapy: Secondary | ICD-10-CM | POA: Insufficient documentation

## 2020-12-22 DIAGNOSIS — Z95828 Presence of other vascular implants and grafts: Secondary | ICD-10-CM

## 2020-12-22 LAB — CBC WITH DIFFERENTIAL (CANCER CENTER ONLY)
Abs Immature Granulocytes: 0.03 10*3/uL (ref 0.00–0.07)
Basophils Absolute: 0 10*3/uL (ref 0.0–0.1)
Basophils Relative: 0 %
Eosinophils Absolute: 0.2 10*3/uL (ref 0.0–0.5)
Eosinophils Relative: 3 %
HCT: 36.1 % (ref 36.0–46.0)
Hemoglobin: 12 g/dL (ref 12.0–15.0)
Immature Granulocytes: 0 %
Lymphocytes Relative: 23 %
Lymphs Abs: 1.7 10*3/uL (ref 0.7–4.0)
MCH: 29.8 pg (ref 26.0–34.0)
MCHC: 33.2 g/dL (ref 30.0–36.0)
MCV: 89.6 fL (ref 80.0–100.0)
Monocytes Absolute: 0.6 10*3/uL (ref 0.1–1.0)
Monocytes Relative: 8 %
Neutro Abs: 5 10*3/uL (ref 1.7–7.7)
Neutrophils Relative %: 66 %
Platelet Count: 297 10*3/uL (ref 150–400)
RBC: 4.03 MIL/uL (ref 3.87–5.11)
RDW: 14.7 % (ref 11.5–15.5)
WBC Count: 7.6 10*3/uL (ref 4.0–10.5)
nRBC: 0 % (ref 0.0–0.2)

## 2020-12-22 LAB — CMP (CANCER CENTER ONLY)
ALT: 26 U/L (ref 0–44)
AST: 28 U/L (ref 15–41)
Albumin: 4.3 g/dL (ref 3.5–5.0)
Alkaline Phosphatase: 49 U/L (ref 38–126)
Anion gap: 9 (ref 5–15)
BUN: 13 mg/dL (ref 6–20)
CO2: 27 mmol/L (ref 22–32)
Calcium: 10.1 mg/dL (ref 8.9–10.3)
Chloride: 102 mmol/L (ref 98–111)
Creatinine: 1.08 mg/dL — ABNORMAL HIGH (ref 0.44–1.00)
GFR, Estimated: >60 mL/min (ref >60)
Glucose, Bld: 78 mg/dL (ref 70–99)
Potassium: 4 mmol/L (ref 3.5–5.1)
Sodium: 138 mmol/L (ref 135–145)
Total Bilirubin: 0.3 mg/dL (ref 0.3–1.2)
Total Protein: 7.8 g/dL (ref 6.5–8.1)

## 2020-12-22 MED ORDER — SODIUM CHLORIDE 0.9 % IV SOLN
3.6000 mg/kg | Freq: Once | INTRAVENOUS | Status: AC
  Administered 2020-12-22: 300 mg via INTRAVENOUS
  Filled 2020-12-22: qty 15

## 2020-12-22 MED ORDER — ACETAMINOPHEN 325 MG PO TABS
650.0000 mg | ORAL_TABLET | Freq: Once | ORAL | Status: AC
  Administered 2020-12-22: 650 mg via ORAL

## 2020-12-22 MED ORDER — SODIUM CHLORIDE 0.9% FLUSH
10.0000 mL | Freq: Once | INTRAVENOUS | Status: AC
  Administered 2020-12-22: 10 mL
  Filled 2020-12-22: qty 10

## 2020-12-22 MED ORDER — SODIUM CHLORIDE 0.9% FLUSH
10.0000 mL | INTRAVENOUS | Status: DC | PRN
  Administered 2020-12-22: 10 mL
  Filled 2020-12-22: qty 10

## 2020-12-22 MED ORDER — DIPHENHYDRAMINE HCL 25 MG PO CAPS
50.0000 mg | ORAL_CAPSULE | Freq: Once | ORAL | Status: AC
Start: 2020-12-22 — End: 2020-12-22
  Administered 2020-12-22: 50 mg via ORAL

## 2020-12-22 MED ORDER — HEPARIN SOD (PORK) LOCK FLUSH 100 UNIT/ML IV SOLN
500.0000 [IU] | Freq: Once | INTRAVENOUS | Status: AC | PRN
  Administered 2020-12-22: 500 [IU]
  Filled 2020-12-22: qty 5

## 2020-12-22 MED ORDER — ACETAMINOPHEN 325 MG PO TABS
ORAL_TABLET | ORAL | Status: AC
  Filled 2020-12-22: qty 2

## 2020-12-22 MED ORDER — DIPHENHYDRAMINE HCL 25 MG PO CAPS
ORAL_CAPSULE | ORAL | Status: AC
  Filled 2020-12-22: qty 2

## 2020-12-22 MED ORDER — SODIUM CHLORIDE 0.9 % IV SOLN
Freq: Once | INTRAVENOUS | Status: AC
  Filled 2020-12-22: qty 250

## 2020-12-22 NOTE — Patient Instructions (Signed)
[facility] Discharge Instructions for Patients Receiving Chemotherapy  Today you received the following chemotherapy agents ado-traztuzumab (Kadcyla)  To help prevent nausea and vomiting after your treatment, we encourage you to take your nausea medication as directed.    If you develop nausea and vomiting that is not controlled by your nausea medication, call the clinic.   BELOW ARE SYMPTOMS THAT SHOULD BE REPORTED IMMEDIATELY:  *FEVER GREATER THAN 100.5 F  *CHILLS WITH OR WITHOUT FEVER  NAUSEA AND VOMITING THAT IS NOT CONTROLLED WITH YOUR NAUSEA MEDICATION  *UNUSUAL SHORTNESS OF BREATH  *UNUSUAL BRUISING OR BLEEDING  TENDERNESS IN MOUTH AND THROAT WITH OR WITHOUT PRESENCE OF ULCERS  *URINARY PROBLEMS  *BOWEL PROBLEMS  UNUSUAL RASH Items with * indicate a potential emergency and should be followed up as soon as possible.  Feel free to call the clinic should you have any questions or concerns. The clinic phone number is [phone].  Please show the [facility] at check-in to the Emergency Department and triage nurse.

## 2020-12-29 ENCOUNTER — Other Ambulatory Visit (HOSPITAL_COMMUNITY): Payer: Self-pay

## 2020-12-29 MED FILL — Gabapentin Cap 300 MG: ORAL | 30 days supply | Qty: 30 | Fill #0 | Status: AC

## 2020-12-31 ENCOUNTER — Other Ambulatory Visit (HOSPITAL_COMMUNITY): Payer: Self-pay

## 2020-12-31 MED FILL — Rivaroxaban Tab 20 MG: ORAL | 30 days supply | Qty: 30 | Fill #1 | Status: AC

## 2021-01-01 ENCOUNTER — Other Ambulatory Visit (HOSPITAL_COMMUNITY): Payer: Self-pay

## 2021-01-11 NOTE — Progress Notes (Signed)
Patient Care Team: Nicholas Lose, MD as PCP - General (Hematology and Oncology) Mauro Kaufmann, RN as Oncology Nurse Navigator Rockwell Germany, RN as Oncology Nurse Navigator Nicholas Lose, MD as Consulting Physician (Hematology and Oncology) Alphonsa Overall, MD as Consulting Physician (General Surgery) Awanda Mink Craige Cotta, RN as Oncology Nurse Navigator (Oncology)  DIAGNOSIS:    ICD-10-CM   1. Malignant neoplasm of upper-inner quadrant of left breast in female, estrogen receptor positive ([place])  C50.212    Z17.0     SUMMARY OF ONCOLOGIC HISTORY: Oncology History  Vulvar cancer ([place])  01/29/2020 Initial Diagnosis   Vulvar cancer ([place])   03/11/2020 - 05/04/2020 Chemotherapy   The patient had dexamethasone (DECADRON) 4 MG tablet, 4 mg (100 % of original dose 4 mg), Oral, Daily, 1 of 1 cycle, Start date: 02/27/2020, End date: 05/05/2020 Dose modification: 4 mg (original dose 4 mg, Cycle 0) palonosetron (ALOXI) injection 0.25 mg, 0.25 mg, Intravenous,  Once, 1 of 1 cycle Administration: 0.25 mg (03/11/2020), 0.25 mg (03/17/2020), 0.25 mg (03/24/2020), 0.25 mg (03/31/2020), 0.25 mg (04/07/2020), 0.25 mg (04/14/2020), 0.25 mg (04/21/2020) CARBOplatin (PARAPLATIN) 300 mg in sodium chloride 0.9 % 250 mL chemo infusion, 300 mg (100 % of original dose 300 mg), Intravenous,  Once, 1 of 1 cycle Dose modification:   (original dose 300 mg, Cycle 1) Administration: 300 mg (03/11/2020), 290 mg (03/17/2020), 300 mg (03/24/2020), 230 mg (03/31/2020), 230 mg (04/07/2020), 230 mg (04/14/2020) PACLitaxel (TAXOL) 108 mg in sodium chloride 0.9 % 250 mL chemo infusion (</= 85m/m2), 50 mg/m2 = 108 mg, Intravenous,  Once, 1 of 1 cycle Dose modification: 45 mg/m2 (original dose 50 mg/m2, Cycle 1, Reason: Dose not tolerated) Administration: 108 mg (03/11/2020), 108 mg (03/17/2020), 108 mg (03/24/2020), 96 mg (03/31/2020), 96 mg (04/07/2020), 96 mg (04/14/2020) fosaprepitant (EMEND) 150 mg in sodium chloride 0.9 % 145 mL IVPB, 150 mg,  Intravenous,  Once, 1 of 1 cycle Administration: 150 mg (03/24/2020), 150 mg (03/31/2020), 150 mg (04/07/2020), 150 mg (04/14/2020) trastuzumab-anns (KANJINTI) 378 mg in sodium chloride 0.9 % 250 mL chemo infusion, 4 mg/kg = 378 mg (100 % of original dose 4 mg/kg), Intravenous,  Once, 1 of 1 cycle Dose modification: 4 mg/kg (original dose 4 mg/kg, Cycle 1, Reason: Other (see comments), Comment: loading dose) Administration: 378 mg (03/11/2020), 189 mg (03/17/2020), 189 mg (03/24/2020), 189 mg (03/31/2020), 189 mg (04/07/2020), 189 mg (04/14/2020), 189 mg (04/21/2020), 189 mg (05/04/2020)  for chemotherapy treatment.    Malignant neoplasm of upper-inner quadrant of left breast in female, estrogen receptor positive ([place]  02/20/2020 Initial Diagnosis   PET scan on 02/07/20 following a diagnosis of vulvar cancer showed a 1.7cm left breast mass and mildly hypermetabolic left axillary lymph nodes. [place]ammogram [place] showed a 3.1cm left breast mass at the 11 o'clock position, palpable on exam, and a single abnormal left axillary lymph node with cortical thickening. Left breast biopsy on 02/20/20 showed invasive and in situ carcinoma in the breast and axilla, grade 3, HER-2 positive (3+), ER/PR+ 95%, Ki67 75%   02/25/2020 Cancer Staging   Staging form: Breast, AJCC 8th Edition - Clinical stage from 02/25/2020: Stage IB (cT2, cN1, cM0, G3, ER+, PR+, HER2+) - Signed by GNicholas Lose MD on 02/25/2020   05/29/2020 - 05/29/2020 Chemotherapy   The patient had palonosetron (ALOXI) injection 0.25 mg, 0.25 mg, Intravenous,  Once, 1 of 3 cycles Administration: 0.25 mg (05/29/2020) pegfilgrastim-jmdb (FULPHILA) injection 6 mg, 6 mg, Subcutaneous,  Once, 1 of 3  cycles CARBOplatin (PARAPLATIN) 580 mg in sodium chloride 0.9 % 250 mL chemo infusion, 580 mg (100 % of original dose 580 mg), Intravenous,  Once, 1 of 3 cycles Dose modification:   (original dose 580 mg, Cycle 1) Administration: 580 mg (05/29/2020) DOCEtaxel (TAXOTERE)  120 mg in sodium chloride 0.9 % 250 mL chemo infusion, 60 mg/m2 = 120 mg (100 % of original dose 60 mg/m2), Intravenous,  Once, 1 of 3 cycles Dose modification: 60 mg/m2 (original dose 60 mg/m2, Cycle 1, Reason: Provider Judgment), 50 mg/m2 (original dose 60 mg/m2, Cycle 2, Reason: Dose not tolerated) Administration: 120 mg (05/29/2020) fosaprepitant (EMEND) 150 mg in sodium chloride 0.9 % 145 mL IVPB, 150 mg, Intravenous,  Once, 1 of 3 cycles Administration: 150 mg (05/29/2020) pertuzumab (PERJETA) 420 mg in sodium chloride 0.9 % 250 mL chemo infusion, 420 mg (50 % of original dose 840 mg), Intravenous, Once, 1 of 3 cycles Dose modification: 420 mg (original dose 840 mg, Cycle 1, Reason: Provider Judgment) Administration: 420 mg (05/29/2020) trastuzumab-dkst (OGIVRI) 525 mg in sodium chloride 0.9 % 250 mL chemo infusion, 6 mg/kg = 525 mg (75 % of original dose 8 mg/kg), Intravenous,  Once, 1 of 3 cycles Dose modification: 6 mg/kg (original dose 8 mg/kg, Cycle 1, Reason: Provider Judgment) Administration: 525 mg (05/29/2020)  for chemotherapy treatment.    06/22/2020 Genetic Testing   Positive genetic testing: Heterozygous pathogenic variant detected in BRIP1 gene at c.2010dup (p.Glu671*).  Variant of uncertain significance detected in POLD1 at c.532G>A (p.Gly178Arg).  No other pathogenic variants detected in [facility] Common Hereditary Cancers Panel.  The report date is June 22, 2020.   The Common Hereditary Cancers Panel offered by [facility] includes sequencing and/or deletion duplication testing of the following 48 genes: APC, ATM, AXIN2, BARD1, BMPR1A, BRCA1, BRCA2, BRIP1, CDH1, CDK4, CDKN2A (p14ARF), CDKN2A (p16INK4a), CHEK2, CTNNA1, DICER1, EPCAM (Deletion/duplication testing only), GREM1 (promoter region deletion/duplication testing only), KIT, MEN1, MLH1, MSH2, MSH3, MSH6, MUTYH, NBN, NF1, NHTL1, PALB2, PDGFRA, PMS2, POLD1, POLE, PTEN, RAD50, RAD51C, RAD51D, RNF43, SDHB, SDHC, SDHD, SMAD4,  SMARCA4. STK11, TP53, TSC1, TSC2, and VHL.  The following genes were evaluated for sequence changes only: SDHA and HOXB13 c.251G>A variant only.   07/27/2020 Surgery   Left mastectomy [name]): three foci of IDC, 3.7cm, 1.5cm, and 1.2cm, with intermediate to high grade DCIS, clear margins, with 1/2 left axillary lymph nodes positive for carcinoma.    08/18/2020 -  Chemotherapy    Patient is on Treatment Plan: BREAST ADO-TRASTUZUMAB EMTANSINE [facility]) Q21D      09/09/2020 - 10/23/2020 Radiation Therapy   Adjuvant radiation     CHIEF COMPLIANT: Kadcyla maintenance  INTERVAL HISTORY: Sandra Miller is a 45 y.o. with above-mentioned history of left breast cancer and vulvarcancerwho completedneoadjuvant chemotherapy,underwent a left mastectomy, radiation,and is currently on Kadcyla maintenance.She also has a history of DVT currently on Xarelto.She presents to the clinic todayfortreatment.   ALLERGIES:  is allergic to dilaudid [hydromorphone hcl], depakote [divalproex sodium], minocycline, and aspirin.  MEDICATIONS:  Current Outpatient Medications  Medication Sig Dispense Refill  . acetaminophen (TYLENOL) 500 MG tablet Take 2 tablets (1,000 mg total) by mouth every 6 (six) hours as needed for moderate pain or fever. Avoid tylenol until your liver function back to normal 30 tablet 0  . anastrozole (ARIMIDEX) 1 MG tablet TAKE 1 TABLET BY MOUTH DAILY. 90 tablet 3  . Cholecalciferol (D3-1000 PO) Take 1 tablet by mouth daily.    . diphenoxylate-atropine (LOMOTIL) 2.5-0.025 MG tablet TAKE 1  TO 2 TABLETS BY MOUTH FOUR TIMES DAILY AS NEEDED FOR DIARRHEA (Patient taking differently: Take 1-2 tablets by mouth 4 (four) times daily as needed for diarrhea or loose stools. TAKE 1 TO 2 TABLETS BY MOUTH FOUR TIMES DAILY AS NEEDED FOR DIARRHEA) 45 tablet 1  . doxylamine, Sleep, (UNISOM) 25 MG tablet Take 25 mg by mouth as needed.    . dronabinol (MARINOL) 2.5 MG capsule Take 1 capsule (2.5 mg total)  by mouth 2 (two) times daily before a meal. 60 capsule 0  . gabapentin (NEURONTIN) 300 MG capsule TAKE 1 CAPSULE BY MOUTH ONCE A DAY 30 capsule 5  . lidocaine-prilocaine (EMLA) cream Apply to affected area once 30 g 3  . lisinopril (ZESTRIL) 20 MG tablet TAKE 1/2 TABLET BY MOUTH ONCE A DAY 90 tablet 3  . LORazepam (ATIVAN) 1 MG tablet Take 1 tablet (1 mg total) by mouth 2 (two) times daily as needed for anxiety. 60 tablet 2  . magnesium oxide (MAG-OX) 400 (241.3 Mg) MG tablet Take 1 tablet (400 mg total) by mouth 2 (two) times daily. 60 tablet 1  . Magnesium Oxide 400 (240 Mg) MG TABS TAKE 1 TABLET BY MOUTH ONCE DAILY 30 tablet 1  . Multiple Vitamin (MULTIVITAMIN WITH MINERALS) TABS tablet Take 1 tablet by mouth daily.    . nicotine (NICODERM CQ - DOSED IN MG/24 HOURS) 14 mg/24hr patch PLACE 1 PATCH ONTO THE SKIN DAILY 28 patch 1  . OLANZapine (ZYPREXA) 10 MG tablet TAKE 1 TABLET BY MOUTH AT BEDTIME 90 tablet 0  . omeprazole (PRILOSEC) 40 MG capsule TAKE 1 CAPSULE BY MOUTH EVERY MORNING AND EVERY NIGHT AT BEDTIME 60 capsule 3  . potassium chloride SA (KLOR-CON) 20 MEQ tablet TAKE 1 TABLET BY MOUTH TWICE DAILY 60 tablet 2  . promethazine (PHENERGAN) 25 MG tablet TAKE 1 TABLET BY MOUTH EVERY 6 HOURS AS NEEDED FOR NAUSEA 30 tablet 3  . rivaroxaban (XARELTO) 20 MG TABS tablet TAKE 1 TABLET BY MOUTH DAILY WITH SUPPER. 30 tablet 6  . rosuvastatin (CRESTOR) 10 MG tablet TAKE 1 TABLET BY MOUTH ONCE A DAY 90 tablet 3  . venlafaxine XR (EFFEXOR-XR) 37.5 MG 24 hr capsule TAKE 3 CAPSULES BY MOUTH ONCE A DAY 90 capsule 2   No current facility-administered medications for this visit.   Facility-Administered Medications Ordered in Other Visits  Medication Dose Route Frequency Provider Last Rate Last Admin  . LORazepam (ATIVAN) injection 0.5 mg  0.5 mg Intravenous Once Sandi Mealy E., PA-C      . sodium chloride flush (NS) 0.9 % injection 10 mL  10 mL Intracatheter Once Nicholas Lose, MD        PHYSICAL  EXAMINATION: ECOG PERFORMANCE STATUS: 1 - Symptomatic but completely ambulatory  There were no vitals filed for this visit. There were no vitals filed for this visit.   LABORATORY DATA:  I have reviewed the data as listed CMP Latest Ref Rng & Units 12/22/2020 12/01/2020 11/10/2020  Glucose 70 - 99 mg/dL 78 86 92  BUN 6 - 20 mg/dL $Remove'13 14 13  'owxhJGm$ Creatinine 0.44 - 1.00 mg/dL 1.08(H) 0.77 0.82  Sodium 135 - 145 mmol/L 138 140 138  Potassium 3.5 - 5.1 mmol/L 4.0 4.1 3.5  Chloride 98 - 111 mmol/L 102 106 105  CO2 22 - 32 mmol/L $RemoveB'27 25 25  'awMTUKGP$ Calcium 8.9 - 10.3 mg/dL 10.1 9.0 9.7  Total Protein 6.5 - 8.1 g/dL 7.8 7.1 7.6  Total Bilirubin 0.3 - 1.2 mg/dL  0.3 0.3 0.3  Alkaline Phos 38 - 126 U/L 49 57 59  AST 15 - 41 U/L $Remo'28 30 29  'xQkUv$ ALT 0 - 44 U/L $Remo'26 24 25    'nFMDM$ Lab Results  Component Value Date   WBC 7.6 12/22/2020   HGB 12.0 12/22/2020   HCT 36.1 12/22/2020   MCV 89.6 12/22/2020   PLT 297 12/22/2020   NEUTROABS 5.0 12/22/2020    ASSESSMENT & PLAN:  Malignant neoplasm of upper-inner quadrant of left breast in female, estrogen receptor positive ([place]) Breast cancer and vulvar cancers  02/20/2020:PET scan on 02/07/20 following a diagnosis of vulvar cancer showed a 1.7cm left breast mass and mildly hypermetabolic left axillary lymph nodes. Mammogram and [place] on 02/18/20 showed a 3.1cm left breast mass at the 11 o'clock position, palpable on exam, and a single abnormal left axillary lymph node with cortical thickening. Left breast biopsy on 02/20/20 showed invasive and in situ carcinoma in the breast and axilla, grade 3, HER-2 positive (3+), ER/PR+ 95%, Ki67 75%  Treatment plan: 1.Neoadjuvant chemoradiation for vulvar cancer with Taxol, carboplatin, Herceptin weekly x6 cycles 2.neoadjuvant chemotherapy with TCH Perjeta x3 cycles 3.07/27/20:Left mastectomy [name]): three foci of IDC, 3.7cm, 1.5cm, and 1.2cm, with intermediate to high grade DCIS, clear margins, with 1/2 left axillary lymph nodes positive  for carcinoma. 4. Followed by adjuvant radiation therapyto be completed on 10/23/20 5.Kadcyla maintenance to be completed 04/06/2021 6.Adjuvant antiestrogen therapy with anastrozole started 12/01/2020 7.Adjuvant neratinib ------------------------------------------------------------------------------------------------------------------- Current treatment: Todayiscycle8Kadcyla, with anastrozole 1 mg daily started 12/01/2020 (menopausal) Kadcyla toxicities:Denies any adverse effects to Kadcyla.   Echocardiogram3/16/2022: EF 60 to 65%  Pelvic pressure/ cramps:Could be due to colitis.  Prescribed gabapentin by gastroenterology.  Anastrozole toxicities: Occasional hot flashes but denies anything significant.  Denies any joint pains or stiffness.  Tolerating it extremely well.  Return to clinic in 3 weeks for Kadcyla    No orders of the defined types were placed in this encounter.  The patient has a good understanding of the overall plan. she agrees with it. she will call with any problems that may develop before the next visit here.  Total time spent: 30 mins including face to face time and time spent for planning, charting and coordination of care  Rulon Eisenmenger, MD, MPH 01/12/2021  I, Molly Dorshimer, am acting as scribe for Dr. Nicholas Lose.  I have reviewed the above documentation for accuracy and completeness, and I agree with the above.

## 2021-01-12 ENCOUNTER — Other Ambulatory Visit: Payer: Self-pay

## 2021-01-12 ENCOUNTER — Inpatient Hospital Stay: Payer: PRIVATE HEALTH INSURANCE | Attending: Gynecologic Oncology

## 2021-01-12 ENCOUNTER — Inpatient Hospital Stay: Payer: PRIVATE HEALTH INSURANCE

## 2021-01-12 ENCOUNTER — Inpatient Hospital Stay (HOSPITAL_BASED_OUTPATIENT_CLINIC_OR_DEPARTMENT_OTHER): Payer: PRIVATE HEALTH INSURANCE | Admitting: Hematology and Oncology

## 2021-01-12 VITALS — BP 119/65 | HR 79 | Temp 98.0°F | Resp 18

## 2021-01-12 DIAGNOSIS — Z95828 Presence of other vascular implants and grafts: Secondary | ICD-10-CM

## 2021-01-12 DIAGNOSIS — Z23 Encounter for immunization: Secondary | ICD-10-CM

## 2021-01-12 DIAGNOSIS — C50212 Malignant neoplasm of upper-inner quadrant of left female breast: Secondary | ICD-10-CM

## 2021-01-12 DIAGNOSIS — Z170 Estrogen receptor positive status [ER+]: Secondary | ICD-10-CM

## 2021-01-12 DIAGNOSIS — Z5112 Encounter for antineoplastic immunotherapy: Secondary | ICD-10-CM | POA: Insufficient documentation

## 2021-01-12 DIAGNOSIS — Z79899 Other long term (current) drug therapy: Secondary | ICD-10-CM | POA: Insufficient documentation

## 2021-01-12 LAB — CBC WITH DIFFERENTIAL (CANCER CENTER ONLY)
Abs Immature Granulocytes: 0.02 10*3/uL (ref 0.00–0.07)
Basophils Absolute: 0 10*3/uL (ref 0.0–0.1)
Basophils Relative: 0 %
Eosinophils Absolute: 0.3 10*3/uL (ref 0.0–0.5)
Eosinophils Relative: 4 %
HCT: 35.8 % — ABNORMAL LOW (ref 36.0–46.0)
Hemoglobin: 12 g/dL (ref 12.0–15.0)
Immature Granulocytes: 0 %
Lymphocytes Relative: 23 %
Lymphs Abs: 1.5 10*3/uL (ref 0.7–4.0)
MCH: 29.8 pg (ref 26.0–34.0)
MCHC: 33.5 g/dL (ref 30.0–36.0)
MCV: 88.8 fL (ref 80.0–100.0)
Monocytes Absolute: 0.6 10*3/uL (ref 0.1–1.0)
Monocytes Relative: 9 %
Neutro Abs: 4.3 10*3/uL (ref 1.7–7.7)
Neutrophils Relative %: 64 %
Platelet Count: 280 10*3/uL (ref 150–400)
RBC: 4.03 MIL/uL (ref 3.87–5.11)
RDW: 14.3 % (ref 11.5–15.5)
WBC Count: 6.8 10*3/uL (ref 4.0–10.5)
nRBC: 0 % (ref 0.0–0.2)

## 2021-01-12 LAB — CMP (CANCER CENTER ONLY)
ALT: 20 U/L (ref 0–44)
AST: 26 U/L (ref 15–41)
Albumin: 4.2 g/dL (ref 3.5–5.0)
Alkaline Phosphatase: 61 U/L (ref 38–126)
Anion gap: 11 (ref 5–15)
BUN: 11 mg/dL (ref 6–20)
CO2: 26 mmol/L (ref 22–32)
Calcium: 9.9 mg/dL (ref 8.9–10.3)
Chloride: 103 mmol/L (ref 98–111)
Creatinine: 0.74 mg/dL (ref 0.44–1.00)
GFR, Estimated: >60 mL/min (ref >60)
Glucose, Bld: 93 mg/dL (ref 70–99)
Potassium: 3.9 mmol/L (ref 3.5–5.1)
Sodium: 140 mmol/L (ref 135–145)
Total Bilirubin: 0.3 mg/dL (ref 0.3–1.2)
Total Protein: 7.8 g/dL (ref 6.5–8.1)

## 2021-01-12 MED ORDER — SODIUM CHLORIDE 0.9 % IV SOLN
Freq: Once | INTRAVENOUS | Status: AC
  Filled 2021-01-12: qty 250

## 2021-01-12 MED ORDER — ACETAMINOPHEN 325 MG PO TABS
ORAL_TABLET | ORAL | Status: AC
  Filled 2021-01-12: qty 2

## 2021-01-12 MED ORDER — SODIUM CHLORIDE 0.9% FLUSH
10.0000 mL | Freq: Once | INTRAVENOUS | Status: DC
  Filled 2021-01-12: qty 10

## 2021-01-12 MED ORDER — DIPHENHYDRAMINE HCL 25 MG PO CAPS
50.0000 mg | ORAL_CAPSULE | Freq: Once | ORAL | Status: AC
  Administered 2021-01-12: 50 mg via ORAL

## 2021-01-12 MED ORDER — ACETAMINOPHEN 325 MG PO TABS
650.0000 mg | ORAL_TABLET | Freq: Once | ORAL | Status: AC
  Administered 2021-01-12: 650 mg via ORAL

## 2021-01-12 MED ORDER — SODIUM CHLORIDE 0.9 % IV SOLN
3.6000 mg/kg | Freq: Once | INTRAVENOUS | Status: AC
  Administered 2021-01-12: 300 mg via INTRAVENOUS
  Filled 2021-01-12: qty 15

## 2021-01-12 MED ORDER — DIPHENHYDRAMINE HCL 25 MG PO CAPS
ORAL_CAPSULE | ORAL | Status: AC
  Filled 2021-01-12: qty 2

## 2021-01-12 MED ORDER — SODIUM CHLORIDE 0.9% FLUSH
10.0000 mL | INTRAVENOUS | Status: DC | PRN
  Administered 2021-01-12: 10 mL
  Filled 2021-01-12: qty 10

## 2021-01-12 MED ORDER — SODIUM CHLORIDE 0.9% FLUSH
10.0000 mL | Freq: Once | INTRAVENOUS | Status: AC
  Administered 2021-01-12: 10 mL
  Filled 2021-01-12: qty 10

## 2021-01-12 MED ORDER — HEPARIN SOD (PORK) LOCK FLUSH 100 UNIT/ML IV SOLN
500.0000 [IU] | Freq: Once | INTRAVENOUS | Status: AC | PRN
  Administered 2021-01-12: 500 [IU]
  Filled 2021-01-12: qty 5

## 2021-01-12 NOTE — Progress Notes (Signed)
   Covid-19 Vaccination Clinic  Name:  Sandra Miller    MRN: [id number] DOB: Jan 22, 1976  01/12/2021  Sandra Miller was observed post Covid-19 immunization for 15 minutes without incident. She was provided with Vaccine Information Sheet and instruction to access the V-Safe system.   Sandra Miller was instructed to call 911 with any severe reactions post vaccine: Marland Kitchen Difficulty breathing  . Swelling of face and throat  . A fast heartbeat  . A bad rash all over body  . Dizziness and weakness   Immunizations Administered    Name Date Dose VIS Date Route   PFIZER Comrnaty(Gray TOP) Covid-19 Vaccine 01/12/2021  2:12 PM 0.3 mL 08/13/2020 Intramuscular   Manufacturer: [place]   Lot: [id number]   [place]: [phone]

## 2021-01-12 NOTE — Patient Instructions (Signed)
[facility]ICAL [facility]COL[facility]  Discharge Instructions: °Thank you for choosing [facility] to provide your oncology and hematology care.  ° °If you have a lab appointment with the Cancer Center, please go directly to the Cancer Center and check in at the registration area. °  °Wear comfortable clothing and clothing appropriate for easy access to any Portacath or PICC line.  ° °We strive to give you quality time with your provider. You may need to reschedule your appointment if you arrive late (15 or more minutes).  Arriving late affects you and other patients whose appointments are after yours.  Also, if you miss three or more appointments without notifying the office, you may be dismissed from the clinic at the provider’s discretion.    °  °For prescription refill requests, have your pharmacy contact our office and allow 72 hours for refills to be completed.   ° °Today you received the following chemotherapy and/or immunotherapy agents Kadcyla    °  °To help prevent nausea and vomiting after your treatment, we encourage you to take your nausea medication as directed. ° °BELOW ARE SYMPTOMS THAT SHOULD BE REPORTED IMMEDIATELY: °*FEVER GREATER THAN 100.4 F (38 °C) OR HIGHER °*CHILLS OR SWEATING °*NAUSEA AND VOMITING THAT IS NOT CONTROLLED WITH YOUR NAUSEA MEDICATION °*UNUSUAL SHORTNESS OF BREATH °*UNUSUAL BRUISING OR BLEEDING °*URINARY PROBLEMS (pain or burning when urinating, or frequent urination) °*BOWEL PROBLEMS (unusual diarrhea, constipation, pain near the anus) °TENDERNESS IN MOUTH AND THROAT WITH OR WITHOUT PRESENCE OF ULCERS (sore throat, sores in mouth, or a toothache) °UNUSUAL RASH, SWELLING OR PAIN  °UNUSUAL VAGINAL DISCHARGE OR ITCHING  ° °Items with * indicate a potential emergency and should be followed up as soon as possible or go to the Emergency Department if any problems should occur. ° °Please show the CHEMOTHERAPY ALERT CARD or IMMUNOTHERAPY ALERT CARD at check-in to the  Emergency Department and triage nurse. ° °Should you have questions after your visit or need to cancel or reschedule your appointment, please contact [facility]COL[facility]  Dept: [phone]  and follow the prompts.  Office hours are 8:00 a.m. to 4:30 p.m. Monday - Friday. Please note that voicemails left after 4:00 p.m. may not be returned until the following business day.  We are closed weekends and major holidays. You have access to a nurse at all times for urgent questions. Please call the main number to the clinic Dept: [phone] and follow the prompts. ° ° °For any non-urgent questions, you may also contact your provider using MyChart. We now offer e-Visits for anyone 18 and older to request care online for non-urgent symptoms. For details visit [email or web address]. °  °Also download the MyChart app! Go to the app store, search "MyChart", open the app, select , and log in with your MyChart username and password. ° °Due to Covid, a mask is required upon entering the hospital/clinic. If you do not have a mask, one will be given to you upon arrival. For doctor visits, patients may have 1 support person aged 18 or older with them. For treatment visits, patients cannot have anyone with them due to current Covid guidelines and our immunocompromised population.  ° °

## 2021-01-12 NOTE — Progress Notes (Signed)
Patient observed 30 min post infusion today with no complaints. Patient also observed 15 post COVID booster. Discharged in stable condition with no complaints

## 2021-01-12 NOTE — Assessment & Plan Note (Signed)
Breast cancer and vulvar cancers  02/20/2020:PET scan on 02/07/20 following a diagnosis of vulvar cancer showed a 1.7cm left breast mass and mildly hypermetabolic left axillary lymph nodes. Mammogram and [place] on 02/18/20 showed a 3.1cm left breast mass at the 11 o'clock position, palpable on exam, and a single abnormal left axillary lymph node with cortical thickening. Left breast biopsy on 02/20/20 showed invasive and in situ carcinoma in the breast and axilla, grade 3, HER-2 positive (3+), ER/PR+ 95%, Ki67 75%  Treatment plan: 1.Neoadjuvant chemoradiation for vulvar cancer with Taxol, carboplatin, Herceptin weekly x6 cycles 2.neoadjuvant chemotherapy with TCH Perjeta x3 cycles 3.07/27/20:Left mastectomy [name]): three foci of IDC, 3.7cm, 1.5cm, and 1.2cm, with intermediate to high grade DCIS, clear margins, with 1/2 left axillary lymph nodes positive for carcinoma. 4. Followed by adjuvant radiation therapyto be completed on 10/23/20 5.Kadcyla maintenance to be completed 04/06/2021 6.Adjuvant antiestrogen therapy with anastrozole started 12/01/2020 7.Adjuvant neratinib ------------------------------------------------------------------------------------------------------------------- Current treatment: Todayiscycle8Kadcyla, with anastrozole 1 mg daily started 12/01/2020 (menopausal) Kadcyla toxicities:Denies any adverse effects to Kadcyla.   Echocardiogram3/16/2022: EF 60 to 65%  Pelvic pressure/ cramps:Could be due to colitis.  Prescribed gabapentin by gastroenterology.  Anastrozole toxicities:  Return to clinic in 3 weeks for Kadcyla

## 2021-01-14 ENCOUNTER — Telehealth: Payer: Self-pay | Admitting: Hematology and Oncology

## 2021-01-14 NOTE — Telephone Encounter (Signed)
Scheduled per 5/10 los. Pt will receive an updated appt calendar per next visit 

## 2021-01-27 ENCOUNTER — Encounter: Payer: Self-pay | Admitting: Hematology and Oncology

## 2021-01-27 ENCOUNTER — Other Ambulatory Visit (HOSPITAL_COMMUNITY): Payer: Self-pay

## 2021-01-29 ENCOUNTER — Encounter: Payer: Self-pay | Admitting: *Deleted

## 2021-01-30 ENCOUNTER — Other Ambulatory Visit (HOSPITAL_COMMUNITY): Payer: Self-pay

## 2021-01-30 MED FILL — Gabapentin Cap 300 MG: ORAL | 30 days supply | Qty: 30 | Fill #1 | Status: AC

## 2021-02-01 NOTE — Progress Notes (Signed)
Patient Care Team: Nicholas Lose, MD as PCP - General (Hematology and Oncology) Mauro Kaufmann, RN as Oncology Nurse Navigator Rockwell Germany, RN as Oncology Nurse Navigator Nicholas Lose, MD as Consulting Physician (Hematology and Oncology) Alphonsa Overall, MD as Consulting Physician (General Surgery) Awanda Mink Craige Cotta, RN as Oncology Nurse Navigator (Oncology)  DIAGNOSIS:    ICD-10-CM   1. Malignant neoplasm of upper-inner quadrant of left breast in female, estrogen receptor positive ([place])  C50.212    Z17.0     SUMMARY OF ONCOLOGIC HISTORY: Oncology History  Vulvar cancer ([place])  01/29/2020 Initial Diagnosis   Vulvar cancer ([place])   03/11/2020 - 05/04/2020 Chemotherapy   The patient had dexamethasone (DECADRON) 4 MG tablet, 4 mg (100 % of original dose 4 mg), Oral, Daily, 1 of 1 cycle, Start date: 02/27/2020, End date: 05/05/2020 Dose modification: 4 mg (original dose 4 mg, Cycle 0) palonosetron (ALOXI) injection 0.25 mg, 0.25 mg, Intravenous,  Once, 1 of 1 cycle Administration: 0.25 mg (03/11/2020), 0.25 mg (03/17/2020), 0.25 mg (03/24/2020), 0.25 mg (03/31/2020), 0.25 mg (04/07/2020), 0.25 mg (04/14/2020), 0.25 mg (04/21/2020) CARBOplatin (PARAPLATIN) 300 mg in sodium chloride 0.9 % 250 mL chemo infusion, 300 mg (100 % of original dose 300 mg), Intravenous,  Once, 1 of 1 cycle Dose modification:   (original dose 300 mg, Cycle 1) Administration: 300 mg (03/11/2020), 290 mg (03/17/2020), 300 mg (03/24/2020), 230 mg (03/31/2020), 230 mg (04/07/2020), 230 mg (04/14/2020) PACLitaxel (TAXOL) 108 mg in sodium chloride 0.9 % 250 mL chemo infusion (</= 85m/m2), 50 mg/m2 = 108 mg, Intravenous,  Once, 1 of 1 cycle Dose modification: 45 mg/m2 (original dose 50 mg/m2, Cycle 1, Reason: Dose not tolerated) Administration: 108 mg (03/11/2020), 108 mg (03/17/2020), 108 mg (03/24/2020), 96 mg (03/31/2020), 96 mg (04/07/2020), 96 mg (04/14/2020) fosaprepitant (EMEND) 150 mg in sodium chloride 0.9 % 145 mL IVPB, 150 mg,  Intravenous,  Once, 1 of 1 cycle Administration: 150 mg (03/24/2020), 150 mg (03/31/2020), 150 mg (04/07/2020), 150 mg (04/14/2020) trastuzumab-anns (KANJINTI) 378 mg in sodium chloride 0.9 % 250 mL chemo infusion, 4 mg/kg = 378 mg (100 % of original dose 4 mg/kg), Intravenous,  Once, 1 of 1 cycle Dose modification: 4 mg/kg (original dose 4 mg/kg, Cycle 1, Reason: Other (see comments), Comment: loading dose) Administration: 378 mg (03/11/2020), 189 mg (03/17/2020), 189 mg (03/24/2020), 189 mg (03/31/2020), 189 mg (04/07/2020), 189 mg (04/14/2020), 189 mg (04/21/2020), 189 mg (05/04/2020)  for chemotherapy treatment.    Malignant neoplasm of upper-inner quadrant of left breast in female, estrogen receptor positive ([place]  02/20/2020 Initial Diagnosis   PET scan on 02/07/20 following a diagnosis of vulvar cancer showed a 1.7cm left breast mass and mildly hypermetabolic left axillary lymph nodes. [place] showed a 3.1cm left breast mass at the 11 o'clock position, palpable on exam, and a single abnormal left axillary lymph node with cortical thickening. Left breast biopsy on 02/20/20 showed invasive and in situ carcinoma in the breast and axilla, grade 3, HER-2 positive (3+), ER/PR+ 95%, Ki67 75%   02/25/2020 Cancer Staging   Staging form: Breast, AJCC 8th Edition - Clinical stage from 02/25/2020: Stage IB (cT2, cN1, cM0, G3, ER+, PR+, HER2+) - Signed by GNicholas Lose MD on 02/25/2020   05/29/2020 - 05/29/2020 Chemotherapy   The patient had palonosetron (ALOXI) injection 0.25 mg, 0.25 mg, Intravenous,  Once, 1 of 3 cycles Administration: 0.25 mg (05/29/2020) pegfilgrastim-jmdb (FULPHILA) injection 6 mg, 6 mg, Subcutaneous,  Once, 1 of 3  cycles CARBOplatin (PARAPLATIN) 580 mg in sodium chloride 0.9 % 250 mL chemo infusion, 580 mg (100 % of original dose 580 mg), Intravenous,  Once, 1 of 3 cycles Dose modification:   (original dose 580 mg, Cycle 1) Administration: 580 mg (05/29/2020) DOCEtaxel (TAXOTERE)  120 mg in sodium chloride 0.9 % 250 mL chemo infusion, 60 mg/m2 = 120 mg (100 % of original dose 60 mg/m2), Intravenous,  Once, 1 of 3 cycles Dose modification: 60 mg/m2 (original dose 60 mg/m2, Cycle 1, Reason: Provider Judgment), 50 mg/m2 (original dose 60 mg/m2, Cycle 2, Reason: Dose not tolerated) Administration: 120 mg (05/29/2020) fosaprepitant (EMEND) 150 mg in sodium chloride 0.9 % 145 mL IVPB, 150 mg, Intravenous,  Once, 1 of 3 cycles Administration: 150 mg (05/29/2020) pertuzumab (PERJETA) 420 mg in sodium chloride 0.9 % 250 mL chemo infusion, 420 mg (50 % of original dose 840 mg), Intravenous, Once, 1 of 3 cycles Dose modification: 420 mg (original dose 840 mg, Cycle 1, Reason: Provider Judgment) Administration: 420 mg (05/29/2020) trastuzumab-dkst (OGIVRI) 525 mg in sodium chloride 0.9 % 250 mL chemo infusion, 6 mg/kg = 525 mg (75 % of original dose 8 mg/kg), Intravenous,  Once, 1 of 3 cycles Dose modification: 6 mg/kg (original dose 8 mg/kg, Cycle 1, Reason: Provider Judgment) Administration: 525 mg (05/29/2020)  for chemotherapy treatment.    06/22/2020 Genetic Testing   Positive genetic testing: Heterozygous pathogenic variant detected in BRIP1 gene at c.2010dup (p.Glu671*).  Variant of uncertain significance detected in POLD1 at c.532G>A (p.Gly178Arg).  No other pathogenic variants detected in [facility] Common Hereditary Cancers Panel.  The report date is June 22, 2020.   The Common Hereditary Cancers Panel offered by [facility] includes sequencing and/or deletion duplication testing of the following 48 genes: APC, ATM, AXIN2, BARD1, BMPR1A, BRCA1, BRCA2, BRIP1, CDH1, CDK4, CDKN2A (p14ARF), CDKN2A (p16INK4a), CHEK2, CTNNA1, DICER1, EPCAM (Deletion/duplication testing only), GREM1 (promoter region deletion/duplication testing only), KIT, MEN1, MLH1, MSH2, MSH3, MSH6, MUTYH, NBN, NF1, NHTL1, PALB2, PDGFRA, PMS2, POLD1, POLE, PTEN, RAD50, RAD51C, RAD51D, RNF43, SDHB, SDHC, SDHD, SMAD4,  SMARCA4. STK11, TP53, TSC1, TSC2, and VHL.  The following genes were evaluated for sequence changes only: SDHA and HOXB13 c.251G>A variant only.   07/27/2020 Surgery   Left mastectomy [name]): three foci of IDC, 3.7cm, 1.5cm, and 1.2cm, with intermediate to high grade DCIS, clear margins, with 1/2 left axillary lymph nodes positive for carcinoma.    08/18/2020 -  Chemotherapy    Patient is on Treatment Plan: BREAST ADO-TRASTUZUMAB EMTANSINE [facility]) Q21D      09/09/2020 - 10/23/2020 Radiation Therapy   Adjuvant radiation     CHIEF COMPLIANT: Kadcyla maintenance  INTERVAL HISTORY: Sandra Miller is a 45 y.o. with above-mentioned history of left breast cancer and vulvarcancerwho completedneoadjuvant chemotherapy,underwent a left mastectomy, radiation,and is currently on Kadcyla maintenance.She also has a history of DVT currently on Xarelto.She presents to the clinic todayfortreatment.She is complaining of diffuse body aches and pains with swelling of her hands.  This is a result of anastrozole therapy.  ALLERGIES:  is allergic to dilaudid [hydromorphone hcl], depakote [divalproex sodium], minocycline, and aspirin.  MEDICATIONS:  Current Outpatient Medications  Medication Sig Dispense Refill  . acetaminophen (TYLENOL) 500 MG tablet Take 2 tablets (1,000 mg total) by mouth every 6 (six) hours as needed for moderate pain or fever. Avoid tylenol until your liver function back to normal 30 tablet 0  . anastrozole (ARIMIDEX) 1 MG tablet TAKE 1 TABLET BY MOUTH DAILY. 90 tablet 3  .  Cholecalciferol (D3-1000 PO) Take 1 tablet by mouth daily.    . diphenoxylate-atropine (LOMOTIL) 2.5-0.025 MG tablet TAKE 1 TO 2 TABLETS BY MOUTH FOUR TIMES DAILY AS NEEDED FOR DIARRHEA (Patient taking differently: Take 1-2 tablets by mouth 4 (four) times daily as needed for diarrhea or loose stools. TAKE 1 TO 2 TABLETS BY MOUTH FOUR TIMES DAILY AS NEEDED FOR DIARRHEA) 45 tablet 1  . doxylamine, Sleep,  (UNISOM) 25 MG tablet Take 25 mg by mouth as needed.    . dronabinol (MARINOL) 2.5 MG capsule Take 1 capsule (2.5 mg total) by mouth 2 (two) times daily before a meal. 60 capsule 0  . gabapentin (NEURONTIN) 300 MG capsule TAKE 1 CAPSULE BY MOUTH ONCE A DAY 30 capsule 5  . lidocaine-prilocaine (EMLA) cream Apply to affected area once 30 g 3  . lisinopril (ZESTRIL) 20 MG tablet TAKE 1/2 TABLET BY MOUTH ONCE A DAY 90 tablet 3  . LORazepam (ATIVAN) 1 MG tablet Take 1 tablet (1 mg total) by mouth 2 (two) times daily as needed for anxiety. 60 tablet 2  . magnesium oxide (MAG-OX) 400 (241.3 Mg) MG tablet Take 1 tablet (400 mg total) by mouth 2 (two) times daily. 60 tablet 1  . Magnesium Oxide 400 (240 Mg) MG TABS TAKE 1 TABLET BY MOUTH ONCE DAILY 30 tablet 1  . Multiple Vitamin (MULTIVITAMIN WITH MINERALS) TABS tablet Take 1 tablet by mouth daily.    . nicotine (NICODERM CQ - DOSED IN MG/24 HOURS) 14 mg/24hr patch PLACE 1 PATCH ONTO THE SKIN DAILY 28 patch 1  . OLANZapine (ZYPREXA) 10 MG tablet TAKE 1 TABLET BY MOUTH AT BEDTIME 90 tablet 0  . omeprazole (PRILOSEC) 40 MG capsule TAKE 1 CAPSULE BY MOUTH EVERY MORNING AND EVERY NIGHT AT BEDTIME 60 capsule 3  . promethazine (PHENERGAN) 25 MG tablet TAKE 1 TABLET BY MOUTH EVERY 6 HOURS AS NEEDED FOR NAUSEA 30 tablet 3  . rivaroxaban (XARELTO) 20 MG TABS tablet TAKE 1 TABLET BY MOUTH DAILY WITH SUPPER. 30 tablet 6  . rosuvastatin (CRESTOR) 10 MG tablet TAKE 1 TABLET BY MOUTH ONCE A DAY 90 tablet 3  . venlafaxine XR (EFFEXOR-XR) 37.5 MG 24 hr capsule TAKE 3 CAPSULES BY MOUTH ONCE A DAY 90 capsule 2   No current facility-administered medications for this visit.   Facility-Administered Medications Ordered in Other Visits  Medication Dose Route Frequency Provider Last Rate Last Admin  . LORazepam (ATIVAN) injection 0.5 mg  0.5 mg Intravenous Once Harle Stanford., PA-C        PHYSICAL EXAMINATION: ECOG PERFORMANCE STATUS: 1 - Symptomatic but completely  ambulatory  Vitals:   02/02/21 1126  BP: 124/83  Pulse: 86  Resp: 18  Temp: 97.7 F (36.5 C)  SpO2: 99%   Filed Weights   02/02/21 1126  Weight: 192 lb 4.8 oz (87.2 kg)    LABORATORY DATA:  I have reviewed the data as listed CMP Latest Ref Rng & Units 01/12/2021 12/22/2020 12/01/2020  Glucose 70 - 99 mg/dL 93 78 86  BUN 6 - 20 mg/dL _0 Creatinine 0.44 - 1.00 mg/dL 0.74 1.08(H) 0.77  Sodium 135 - 145 mmol/L 140 138 140  Potassium 3.5 - 5.1 mmol/L 3.9 4.0 4.1  Chloride 98 - 111 mmol/L 103 102 106  CO2 22 - 32 mmol/L _1 Calcium 8.9 - 10.3 mg/dL 9.9 10.1 9.0  Total Protein 6.5 - 8.1 g/dL 7.8 7.8 7.1  Total Bilirubin 0.3 -  1.2 mg/dL 0.3 0.3 0.3  Alkaline Phos 38 - 126 U/L 61 49 57  AST 15 - 41 U/L _0 ALT 0 - 44 U/L _1 Lab Results  Component Value Date   WBC 6.6 02/02/2021   HGB 11.9 (L) 02/02/2021   HCT 35.5 (L) 02/02/2021   MCV 89.4 02/02/2021   PLT 286 02/02/2021   NEUTROABS 4.2 02/02/2021    ASSESSMENT & PLAN:  Malignant neoplasm of upper-inner quadrant of left breast in female, estrogen receptor positive ([place]) Breast cancer and vulvar cancers  02/20/2020:PET scan on 02/07/20 following a diagnosis of vulvar cancer showed a 1.7cm left breast mass and mildly hypermetabolic left axillary lymph nodes. Mammogram and [place] on 02/18/20 showed a 3.1cm left breast mass at the 11 o'clock position, palpable on exam, and a single abnormal left axillary lymph node with cortical thickening. Left breast biopsy on 02/20/20 showed invasive and in situ carcinoma in the breast and axilla, grade 3, HER-2 positive (3+), ER/PR+ 95%, Ki67 75%  Treatment plan: 1.Neoadjuvant chemoradiation for vulvar cancer with Taxol, carboplatin, Herceptin weekly x6 cycles 2.neoadjuvant chemotherapy with TCH Perjeta x3 cycles 3.07/27/20:Left mastectomy [name]): three foci of IDC, 3.7cm, 1.5cm, and 1.2cm, with intermediate to high grade DCIS, clear margins, with 1/2 left axillary  lymph nodes positive for carcinoma. 4. Followed by adjuvant radiation therapyto be completed on 10/23/20 5.Kadcyla maintenanceto be completed 04/06/2021 6.Adjuvant antiestrogen therapywith anastrozole started 12/01/2020 7.Adjuvant neratinib ------------------------------------------------------------------------------------------------------------------- Current treatment: Todayiscycle9Kadcyla, with anastrozole 1 mg daily started 12/01/2020 (menopausal) Kadcyla toxicities:Denies any adverse effects to Kadcyla.  Echocardiogram3/16/2022: LW78 to 65%  Pelvic pressure/ cramps:Could be due to colitis.Prescribed gabapentin by gastroenterology.  Anastrozole toxicities:  Diffuse musculoskeletal aches and pains especially in her hands and the throughout her body.  I instructed her to discontinue anastrozole.  When she comes back in 3 weeks we will reassess her symptoms.  And if her symptoms are better then we will switch her to letrozole therapy.  Return to clinic in 3 weeks for Kadcyla    No orders of the defined types were placed in this encounter.  The patient has a good understanding of the overall plan. she agrees with it. she will call with any problems that may develop before the next visit here.  Total time spent: 30 mins including face to face time and time spent for planning, charting and coordination of care  Rulon Eisenmenger, MD, MPH 02/02/2021  I, Cloyde Reams Dorshimer, am acting as scribe for Dr. Nicholas Lose.  I have reviewed the above documentation for accuracy and completeness, and I agree with the above.

## 2021-02-02 ENCOUNTER — Other Ambulatory Visit: Payer: Self-pay

## 2021-02-02 ENCOUNTER — Other Ambulatory Visit (HOSPITAL_COMMUNITY): Payer: Self-pay

## 2021-02-02 ENCOUNTER — Inpatient Hospital Stay (HOSPITAL_BASED_OUTPATIENT_CLINIC_OR_DEPARTMENT_OTHER): Payer: PRIVATE HEALTH INSURANCE | Admitting: Hematology and Oncology

## 2021-02-02 ENCOUNTER — Inpatient Hospital Stay: Payer: PRIVATE HEALTH INSURANCE

## 2021-02-02 DIAGNOSIS — Z170 Estrogen receptor positive status [ER+]: Secondary | ICD-10-CM

## 2021-02-02 DIAGNOSIS — Z5112 Encounter for antineoplastic immunotherapy: Secondary | ICD-10-CM | POA: Diagnosis not present

## 2021-02-02 DIAGNOSIS — C50212 Malignant neoplasm of upper-inner quadrant of left female breast: Secondary | ICD-10-CM | POA: Diagnosis not present

## 2021-02-02 DIAGNOSIS — Z95828 Presence of other vascular implants and grafts: Secondary | ICD-10-CM

## 2021-02-02 LAB — CMP (CANCER CENTER ONLY)
ALT: 22 U/L (ref 0–44)
AST: 27 U/L (ref 15–41)
Albumin: 4 g/dL (ref 3.5–5.0)
Alkaline Phosphatase: 62 U/L (ref 38–126)
Anion gap: 10 (ref 5–15)
BUN: 10 mg/dL (ref 6–20)
CO2: 27 mmol/L (ref 22–32)
Calcium: 10.3 mg/dL (ref 8.9–10.3)
Chloride: 102 mmol/L (ref 98–111)
Creatinine: 0.73 mg/dL (ref 0.44–1.00)
GFR, Estimated: >60 mL/min (ref >60)
Glucose, Bld: 104 mg/dL — ABNORMAL HIGH (ref 70–99)
Potassium: 3.6 mmol/L (ref 3.5–5.1)
Sodium: 139 mmol/L (ref 135–145)
Total Bilirubin: 0.4 mg/dL (ref 0.3–1.2)
Total Protein: 7.7 g/dL (ref 6.5–8.1)

## 2021-02-02 LAB — CBC WITH DIFFERENTIAL (CANCER CENTER ONLY)
Abs Immature Granulocytes: 0.02 10*3/uL (ref 0.00–0.07)
Basophils Absolute: 0 10*3/uL (ref 0.0–0.1)
Basophils Relative: 1 %
Eosinophils Absolute: 0.2 10*3/uL (ref 0.0–0.5)
Eosinophils Relative: 3 %
HCT: 35.5 % — ABNORMAL LOW (ref 36.0–46.0)
Hemoglobin: 11.9 g/dL — ABNORMAL LOW (ref 12.0–15.0)
Immature Granulocytes: 0 %
Lymphocytes Relative: 25 %
Lymphs Abs: 1.6 10*3/uL (ref 0.7–4.0)
MCH: 30 pg (ref 26.0–34.0)
MCHC: 33.5 g/dL (ref 30.0–36.0)
MCV: 89.4 fL (ref 80.0–100.0)
Monocytes Absolute: 0.5 10*3/uL (ref 0.1–1.0)
Monocytes Relative: 8 %
Neutro Abs: 4.2 10*3/uL (ref 1.7–7.7)
Neutrophils Relative %: 63 %
Platelet Count: 286 10*3/uL (ref 150–400)
RBC: 3.97 MIL/uL (ref 3.87–5.11)
RDW: 14 % (ref 11.5–15.5)
WBC Count: 6.6 10*3/uL (ref 4.0–10.5)
nRBC: 0 % (ref 0.0–0.2)

## 2021-02-02 MED ORDER — DIPHENHYDRAMINE HCL 25 MG PO CAPS
ORAL_CAPSULE | ORAL | Status: AC
  Filled 2021-02-02: qty 2

## 2021-02-02 MED ORDER — ACETAMINOPHEN 325 MG PO TABS
650.0000 mg | ORAL_TABLET | Freq: Once | ORAL | Status: AC
  Administered 2021-02-02: 650 mg via ORAL

## 2021-02-02 MED ORDER — ACETAMINOPHEN 325 MG PO TABS
ORAL_TABLET | ORAL | Status: AC
  Filled 2021-02-02: qty 2

## 2021-02-02 MED ORDER — SODIUM CHLORIDE 0.9% FLUSH
10.0000 mL | Freq: Once | INTRAVENOUS | Status: AC
  Administered 2021-02-02: 10 mL
  Filled 2021-02-02: qty 10

## 2021-02-02 MED ORDER — SODIUM CHLORIDE 0.9% FLUSH
10.0000 mL | INTRAVENOUS | Status: DC | PRN
  Administered 2021-02-02: 10 mL
  Filled 2021-02-02: qty 10

## 2021-02-02 MED ORDER — HEPARIN SOD (PORK) LOCK FLUSH 100 UNIT/ML IV SOLN
500.0000 [IU] | Freq: Once | INTRAVENOUS | Status: AC | PRN
  Administered 2021-02-02: 500 [IU]
  Filled 2021-02-02: qty 5

## 2021-02-02 MED ORDER — SODIUM CHLORIDE 0.9 % IV SOLN
3.6000 mg/kg | Freq: Once | INTRAVENOUS | Status: AC
  Administered 2021-02-02: 300 mg via INTRAVENOUS
  Filled 2021-02-02: qty 15

## 2021-02-02 MED ORDER — SODIUM CHLORIDE 0.9 % IV SOLN
Freq: Once | INTRAVENOUS | Status: AC
  Filled 2021-02-02: qty 250

## 2021-02-02 MED ORDER — DIPHENHYDRAMINE HCL 25 MG PO CAPS
50.0000 mg | ORAL_CAPSULE | Freq: Once | ORAL | Status: AC
  Administered 2021-02-02: 50 mg via ORAL

## 2021-02-02 MED FILL — Rivaroxaban Tab 20 MG: ORAL | 30 days supply | Qty: 30 | Fill #2 | Status: AC

## 2021-02-02 NOTE — Assessment & Plan Note (Signed)
Breast cancer and vulvar cancers  02/20/2020:PET scan on 02/07/20 following a diagnosis of vulvar cancer showed a 1.7cm left breast mass and mildly hypermetabolic left axillary lymph nodes. Mammogram and [place] on 02/18/20 showed a 3.1cm left breast mass at the 11 o'clock position, palpable on exam, and a single abnormal left axillary lymph node with cortical thickening. Left breast biopsy on 02/20/20 showed invasive and in situ carcinoma in the breast and axilla, grade 3, HER-2 positive (3+), ER/PR+ 95%, Ki67 75%  Treatment plan: 1.Neoadjuvant chemoradiation for vulvar cancer with Taxol, carboplatin, Herceptin weekly x6 cycles 2.neoadjuvant chemotherapy with TCH Perjeta x3 cycles 3.07/27/20:Left mastectomy [name]): three foci of IDC, 3.7cm, 1.5cm, and 1.2cm, with intermediate to high grade DCIS, clear margins, with 1/2 left axillary lymph nodes positive for carcinoma. 4. Followed by adjuvant radiation therapyto be completed on 10/23/20 5.Kadcyla maintenanceto be completed 04/06/2021 6.Adjuvant antiestrogen therapywith anastrozole started 12/01/2020 7.Adjuvant neratinib ------------------------------------------------------------------------------------------------------------------- Current treatment: Todayiscycle9Kadcyla, with anastrozole 1 mg daily started 12/01/2020 (menopausal) Kadcyla toxicities:Denies any adverse effects to Kadcyla.  Echocardiogram3/16/2022: FS23 to 65%  Pelvic pressure/ cramps:Could be due to colitis.Prescribed gabapentin by gastroenterology.  Anastrozole toxicities: Occasional hot flashes but denies anything significant.  Denies any joint pains or stiffness.  Tolerating it extremely well.  Return to clinic in 3 weeks for Kadcyla

## 2021-02-08 ENCOUNTER — Other Ambulatory Visit (HOSPITAL_COMMUNITY): Payer: Self-pay

## 2021-02-08 MED FILL — Omeprazole Cap Delayed Release 40 MG: ORAL | 30 days supply | Qty: 60 | Fill #0 | Status: AC

## 2021-02-09 ENCOUNTER — Other Ambulatory Visit (HOSPITAL_COMMUNITY): Payer: Self-pay

## 2021-02-09 MED FILL — Lisinopril Tab 20 MG: ORAL | 90 days supply | Qty: 45 | Fill #0 | Status: AC

## 2021-02-09 MED FILL — Promethazine HCl Tab 25 MG: ORAL | 7 days supply | Qty: 30 | Fill #0 | Status: AC

## 2021-02-09 MED FILL — Rosuvastatin Calcium Tab 10 MG: ORAL | 90 days supply | Qty: 90 | Fill #0 | Status: AC

## 2021-02-17 ENCOUNTER — Encounter: Payer: Self-pay | Admitting: Hematology and Oncology

## 2021-02-19 ENCOUNTER — Ambulatory Visit: Payer: Self-pay | Admitting: Gynecologic Oncology

## 2021-02-19 ENCOUNTER — Encounter: Payer: Self-pay | Admitting: Hematology and Oncology

## 2021-02-19 ENCOUNTER — Other Ambulatory Visit (HOSPITAL_COMMUNITY): Payer: Self-pay

## 2021-02-19 ENCOUNTER — Inpatient Hospital Stay: Payer: PRIVATE HEALTH INSURANCE | Attending: Gynecologic Oncology | Admitting: Gynecologic Oncology

## 2021-02-19 ENCOUNTER — Other Ambulatory Visit: Payer: Self-pay

## 2021-02-19 VITALS — BP 133/92 | HR 93 | Temp 97.7°F | Resp 18 | Ht 65.0 in | Wt 193.6 lb

## 2021-02-19 DIAGNOSIS — A6000 Herpesviral infection of urogenital system, unspecified: Secondary | ICD-10-CM | POA: Diagnosis not present

## 2021-02-19 DIAGNOSIS — C519 Malignant neoplasm of vulva, unspecified: Secondary | ICD-10-CM

## 2021-02-19 DIAGNOSIS — Z7901 Long term (current) use of anticoagulants: Secondary | ICD-10-CM | POA: Insufficient documentation

## 2021-02-19 DIAGNOSIS — C50212 Malignant neoplasm of upper-inner quadrant of left female breast: Secondary | ICD-10-CM | POA: Insufficient documentation

## 2021-02-19 DIAGNOSIS — Z1502 Genetic susceptibility to malignant neoplasm of ovary: Secondary | ICD-10-CM | POA: Diagnosis not present

## 2021-02-19 DIAGNOSIS — Z148 Genetic carrier of other disease: Secondary | ICD-10-CM | POA: Diagnosis not present

## 2021-02-19 DIAGNOSIS — F319 Bipolar disorder, unspecified: Secondary | ICD-10-CM | POA: Insufficient documentation

## 2021-02-19 DIAGNOSIS — Z5112 Encounter for antineoplastic immunotherapy: Secondary | ICD-10-CM | POA: Diagnosis not present

## 2021-02-19 DIAGNOSIS — Z8541 Personal history of malignant neoplasm of cervix uteri: Secondary | ICD-10-CM | POA: Diagnosis not present

## 2021-02-19 DIAGNOSIS — Z923 Personal history of irradiation: Secondary | ICD-10-CM | POA: Diagnosis not present

## 2021-02-19 DIAGNOSIS — Z1589 Genetic susceptibility to other disease: Secondary | ICD-10-CM

## 2021-02-19 DIAGNOSIS — Z86718 Personal history of other venous thrombosis and embolism: Secondary | ICD-10-CM | POA: Insufficient documentation

## 2021-02-19 DIAGNOSIS — K219 Gastro-esophageal reflux disease without esophagitis: Secondary | ICD-10-CM | POA: Insufficient documentation

## 2021-02-19 DIAGNOSIS — Z9079 Acquired absence of other genital organ(s): Secondary | ICD-10-CM | POA: Diagnosis not present

## 2021-02-19 DIAGNOSIS — F411 Generalized anxiety disorder: Secondary | ICD-10-CM | POA: Diagnosis not present

## 2021-02-19 MED ORDER — SENNOSIDES-DOCUSATE SODIUM 8.6-50 MG PO TABS
2.0000 | ORAL_TABLET | Freq: Every day | ORAL | 0 refills | Status: DC
Start: 2021-02-19 — End: 2023-05-29
  Filled 2021-02-19: qty 30, 15d supply, fill #0

## 2021-02-19 MED ORDER — TRAMADOL HCL 50 MG PO TABS
50.0000 mg | ORAL_TABLET | Freq: Four times a day (QID) | ORAL | 0 refills | Status: DC | PRN
  Filled 2021-02-19: qty 15, 4d supply, fill #0

## 2021-02-19 NOTE — Patient Instructions (Signed)
Preparing for your Surgery  Plan for surgery on May 18, 2021 with Dr. Everitt Amber at [place] will be scheduled for a robotic assisted total laparoscopic hysterectomy (removal of the uterus and cervix), bilateral salpingo-oophorectomy (removal of both ovaries and fallopian tubes), vulvar biopsy.   Pre-operative Testing -You will receive a phone call from presurgical testing at [facility] to arrange for a pre-operative appointment and lab work.  -Bring your insurance card, copy of an advanced directive if applicable, medication list  -At that visit, you will be asked to sign a consent for a possible blood transfusion in case a transfusion becomes necessary during surgery.  The need for a blood transfusion is rare but having consent is a necessary part of your care.     -YOU WILL NEED TO STOP YOUR [place] 5 DAYS BEFORE SURGERY AND DO NOT RESUME TAKING IT FOR 2 DAYS AFTER SURGERY. YOUR LAST DOSE WILL BE ON May 13, 2021.  -Do not take supplements such as fish oil (omega 3), red yeast rice, turmeric before your surgery. You want to avoid medications with aspirin in them including headache powders such as BC or Goody's), Excedrin migraine.  Day Before Surgery at [place] will be asked to take in a light diet the day before surgery. You will be advised you can have clear liquids up until 3 hours before your surgery.    Eat a light diet the day before surgery.  Examples including soups, broths, toast, yogurt, mashed potatoes.  AVOID GAS PRODUCING FOODS. Things to avoid include carbonated beverages (fizzy beverages, sodas), raw fruits and raw vegetables (uncooked), or beans.   If your bowels are filled with gas, your surgeon will have difficulty visualizing your pelvic organs which increases your surgical risks.  Your role in recovery Your role is to become active as soon as directed by your doctor, while still giving yourself time to heal.  Rest when you feel  tired. You will be asked to do the following in order to speed your recovery:  - Cough and breathe deeply. This helps to clear and expand your lungs and can prevent pneumonia after surgery.  - [employer]. Do mild physical activity. Walking or moving your legs help your circulation and body functions return to normal. Do not try to get up or walk alone the first time after surgery.   -If you develop swelling on one leg or the other, pain in the back of your leg, redness/warmth in one of your legs, please call the office or go to the Emergency Room to have a doppler to rule out a blood clot. For shortness of breath, chest pain-seek care in the Emergency Room as soon as possible. - Actively manage your pain. Managing your pain lets you move in comfort. We will ask you to rate your pain on a scale of zero to 10. It is your responsibility to tell your doctor or nurse where and how much you hurt so your pain can be treated.  Special Considerations -If you are diabetic, you may be placed on insulin after surgery to have closer control over your blood sugars to promote healing and recovery.  This does not mean that you will be discharged on insulin.  If applicable, your oral antidiabetics will be resumed when you are tolerating a solid diet.  -Your final pathology results from surgery should be available around one week after surgery and the results will be relayed to you  when available.  -Dr. Lahoma Crocker is the surgeon that assists your GYN Oncologist with surgery.  If you end up staying the night, the next day after your surgery you will either see Dr. Denman George, Dr. Berline Lopes, or Dr. Lahoma Crocker.  -FMLA forms can be faxed to [phone] and please allow 5-7 business days for completion.  Pain Management After Surgery -You have been prescribed your pain medication and bowel regimen medications before surgery so that you can have these available when you are discharged from the  hospital. The pain medication is for use ONLY AFTER surgery and a new prescription will not be given.   -Make sure that you have Tylenol at home to use on a regular basis after surgery for pain control.   -Review the attached handout on narcotic use and their risks and side effects.   Bowel Regimen -You have been prescribed Sennakot-S to take nightly to prevent constipation especially if you are taking the narcotic pain medication intermittently.  It is important to prevent constipation and drink adequate amounts of liquids. You can stop taking this medication when you are not taking pain medication and you are back on your normal bowel routine.  Risks of Surgery Risks of surgery are low but include bleeding, infection, damage to surrounding structures, re-operation, blood clots, and very rarely death.   Blood Transfusion Information (For the consent to be signed before surgery)  We will be checking your blood type before surgery so in case of emergencies, we will know what type of blood you would need.                                            WHAT IS A BLOOD TRANSFUSION?  A transfusion is the replacement of blood or some of its parts. Blood is made up of multiple cells which provide different functions. Red blood cells carry oxygen and are used for blood loss replacement. White blood cells fight against infection. Platelets control bleeding. Plasma helps clot blood. Other blood products are available for specialized needs, such as hemophilia or other clotting disorders. BEFORE THE TRANSFUSION  Who gives blood for transfusions?  You may be able to donate blood to be used at a later date on yourself (autologous donation). Relatives can be asked to donate blood. This is generally not any safer than if you have received blood from a stranger. The same precautions are taken to ensure safety when a relative's blood is donated. Healthy volunteers who are fully evaluated to make sure their  blood is safe. This is blood bank blood. Transfusion therapy is the safest it has ever been in the practice of medicine. Before blood is taken from a donor, a complete history is taken to make sure that person has no history of diseases nor engages in risky social behavior (examples are intravenous drug use or sexual activity with multiple partners). The donor's travel history is screened to minimize risk of transmitting infections, such as malaria. The donated blood is tested for signs of infectious diseases, such as HIV and hepatitis. The blood is then tested to be sure it is compatible with you in order to minimize the chance of a transfusion reaction. If you or a relative donates blood, this is often done in anticipation of surgery and is not appropriate for emergency situations. It takes many days to process the donated blood. RISKS AND  COMPLICATIONS Although transfusion therapy is very safe and saves many lives, the main dangers of transfusion include:  Getting an infectious disease. Developing a transfusion reaction. This is an allergic reaction to something in the blood you were given. Every precaution is taken to prevent this. The decision to have a blood transfusion has been considered carefully by your caregiver before blood is given. Blood is not given unless the benefits outweigh the risks.  AFTER SURGERY INSTRUCTIONS  Return to work: 4 weeks if applicable  Activity: 1. Be up and out of the bed during the day.  Take a nap if needed.  You may walk up steps but be careful and use the hand rail.  Stair climbing will tire you more than you think, you may need to stop part way and rest.   2. No lifting or straining for 6 weeks over 10 pounds. No pushing, pulling, straining for 6 weeks.  3. No driving for around 1 week(s).  Do not drive if you are taking narcotic pain medicine and make sure that your reaction time has returned.   4. You can shower as soon as the next day after surgery.  Shower daily.  Use your regular soap and water (not directly on the incision) and pat your incision(s) dry afterwards; don't rub.  No tub baths or submerging your body in water until cleared by your surgeon. If you have the soap that was given to you by pre-surgical testing that was used before surgery, you do not need to use it afterwards because this can irritate your incisions.   5. No sexual activity and nothing in the vagina for 8 weeks.  6. You may experience a small amount of clear drainage from your incisions, which is normal.  If the drainage persists, increases, or changes color please call the office.  7. Do not use creams, lotions, or ointments such as neosporin on your incisions after surgery until advised by your surgeon because they can cause removal of the dermabond glue on your incisions.    8. You may experience vaginal spotting after surgery or around the 6-8 week mark from surgery when the stitches at the top of the vagina begin to dissolve.  The spotting is normal but if you experience heavy bleeding, call our office.  9. Take Tylenol first for pain and only use narcotic pain medication for severe pain not relieved by the Tylenol.  Monitor your Tylenol intake to a max of 4,000 mg in a 24 hour period.   Diet: 1. Low sodium Heart Healthy Diet is recommended but you are cleared to resume your normal (before surgery) diet after your procedure.  2. It is safe to use a laxative, such as Miralax or Colace, if you have difficulty moving your bowels. You have been prescribed Sennakot at bedtime every evening to keep bowel movements regular and to prevent constipation.    Wound Care: 1. Keep clean and dry.  Shower daily.  Reasons to call the Doctor: Fever - Oral temperature greater than 100.4 degrees Fahrenheit Foul-smelling vaginal discharge Difficulty urinating Nausea and vomiting Increased pain at the site of the incision that is unrelieved with pain medicine. Difficulty  breathing with or without chest pain New calf pain especially if only on one side Sudden, continuing increased vaginal bleeding with or without clots.   Contacts: For questions or concerns you should contact:  Dr. Everitt Amber at [phone]  Joylene John, NP at [phone]  After Hours: call [phone] and have the  GYN Oncologist paged/contacted (after 5 pm or on the weekends).  Messages sent via mychart are for non-urgent matters and are not responded to after hours so for urgent needs, please call the after hours number.

## 2021-02-19 NOTE — Progress Notes (Signed)
Follow-up Note: Gyn-Onc  Consult was requested by Dr. Lindi Adie for the evaluation of Sandra Miller 45 y.o. female  CC:  Chief Complaint  Patient presents with   Mutation in BRIP1 gene   Vulvar cancer [facility])    Assessment/Plan:  Sandra. Sandra Miller  is a 45 y.o.  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 S/p primary radiation completed 05/08/20 with external beam to the pelvis (45 Gy) and boost to the vulva (16.2 Gy). No evidence of recurrent disease on exam. Recommend vulva inspection at 3 monthly intervals until September, 2023. I will take representative vulvar biopsies in the operating room to confirm complete pathologic response.  2/ Deleterious mutation in BRIP 1 conferring increased hereditary risk of ovarian cancer. I am recommending risk reducing bilateral salpingo-oophorectomy.  She strongly desires hysterectomy despite knowing that this particular gene mutation is not associated with the known increased risk for uterine cancer, and the patient has received low pelvic radiation which places her at increased risk for surgical complications associated with hysterectomy, particularly urologic injury or nonhealing vaginal cuff. Based on my exam and her US findings I believe this is reasonable to accomplish.  I counseled her regarding risk for the above-stated surgical complications in addition to  bleeding, infection, damage to internal organs (such as bladder,ureters, bowels), blood clot, reoperation and rehospitalization.  We will schedule this for early September when she is 1 month post completion of chemotherapy. She is scheduled for a robotic assisted total hysterectomy and BSO.  She has a history of left jugular thrombus and is on Xarelto.  We will hold her Xarelto for 5 days preoperatively and 2 days  postoperatively unless there are bleeding complications.  Explained to the patient that she is at increased risk of VTE perioperatively while off Xarelto and also at increased risk for bleeding complications due to its recent use.  HPI: Sandra Miller is a 45 year old parous woman with a history of vulvar cancer who was seen in consultation at the request of Dr Lindi Adie for evaluation of a deleterious mutation in [place].  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  At the time of seeking consultation for sentinel lymph node biopsy with Dr. Thurston Pounds at [facility], it was determined that she was not a good candidate for primary radical vulvectomy due to the close proximity of the lesion to her anal sphincter.  Therefore she was treated with definitive external beam radiation to the lower pelvis and vulvar tissues.  Radiation dosing was between 03/16/20 through 05/08/20. It consisted of IMRT to the pelvis (45 Gy) and IMRT boost to the vulva (16.2 Gy).  Simultaneous to her diagnosis of vulvar cancer was a diagnosis of ER/PR positive breast cancer on the left.  This was stage II with positive lymph node.  She underwent treatment with Adriamycin and Taxotere followed by left mastectomy, trastuzumab infusion for HER-2 positive lesion, and was planned for chest wall radiation beginning in the new year of 2022.  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  Screening ultrasound of  the pelvis on 11/06/2020 revealed a uterus measuring 9.1 x 3.3 x 4.3 cm with an endometrial thickness of 6 mm and bilateral ovaries which were normal in size and appearance.  Interval Hx:  She is receiving Kadcyla adjuvant therapy which is scheduled to be complete in early August, 2022.  She was  amenorrheic since completing radiation to the pelvis. She has no symptoms concerning for vulvar recurrence.  Current Meds:  Outpatient Encounter Medications as of 02/19/2021  Medication Sig   senna-docusate (SENOKOT-S) 8.6-50 MG tablet Take 2 tablets by mouth at bedtime. For AFTER surgery only, do not take if having diarrhea   traMADol (ULTRAM) 50 MG tablet Take 1 tablet (50 mg total) by mouth every 6 (six) hours as needed for severe pain. For AFTER surgery only, do not take and drive   acetaminophen (TYLENOL) 500 MG tablet Take 2 tablets (1,000 mg total) by mouth every 6 (six) hours as needed for moderate pain or fever. Avoid tylenol until your liver function back to normal   anastrozole (ARIMIDEX) 1 MG tablet TAKE 1 TABLET BY MOUTH DAILY.   Cholecalciferol (D3-1000 PO) Take 1 tablet by mouth daily.   diphenoxylate-atropine (LOMOTIL) 2.5-0.025 MG tablet TAKE 1 TO 2 TABLETS BY MOUTH FOUR TIMES DAILY AS NEEDED FOR DIARRHEA (Patient taking differently: Take 1-2 tablets by mouth 4 (four) times daily as needed for diarrhea or loose stools. TAKE 1 TO 2 TABLETS BY MOUTH FOUR TIMES DAILY AS NEEDED FOR DIARRHEA)   doxylamine, Sleep, (UNISOM) 25 MG tablet Take 25 mg by mouth as needed.   dronabinol (MARINOL) 2.5 MG capsule Take 1 capsule (2.5 mg total) by mouth 2 (two) times daily before a meal.   gabapentin (NEURONTIN) 300 MG capsule TAKE 1 CAPSULE BY MOUTH ONCE A DAY   lidocaine-prilocaine (EMLA) cream Apply to affected area once   lisinopril (ZESTRIL) 20 MG tablet TAKE 1/2 TABLET BY MOUTH ONCE A DAY   LORazepam (ATIVAN) 1 MG tablet Take 1 tablet (1 mg total) by mouth 2 (two) times daily as needed for anxiety.   magnesium oxide (MAG-OX) 400 (241.3 Mg) MG tablet Take 1 tablet (400 mg total) by mouth 2 (two) times daily.   Magnesium Oxide 400 (240 Mg) MG TABS TAKE 1 TABLET BY MOUTH ONCE DAILY   Multiple Vitamin (MULTIVITAMIN WITH MINERALS) TABS tablet Take 1 tablet by mouth daily.   nicotine (NICODERM CQ  - DOSED IN MG/24 HOURS) 14 mg/24hr patch PLACE 1 PATCH ONTO THE SKIN DAILY   OLANZapine (ZYPREXA) 10 MG tablet TAKE 1 TABLET BY MOUTH AT BEDTIME   omeprazole (PRILOSEC) 40 MG capsule TAKE 1 CAPSULE BY MOUTH EVERY MORNING AND EVERY NIGHT AT BEDTIME   promethazine (PHENERGAN) 25 MG tablet TAKE 1 TABLET BY MOUTH EVERY 6 HOURS AS NEEDED FOR NAUSEA   rivaroxaban (XARELTO) 20 MG TABS tablet TAKE 1 TABLET BY MOUTH DAILY WITH SUPPER.   rosuvastatin (CRESTOR) 10 MG tablet TAKE 1 TABLET BY MOUTH ONCE A DAY   venlafaxine XR (EFFEXOR-XR) 37.5 MG 24 hr capsule TAKE 3 CAPSULES BY MOUTH ONCE A DAY   [DISCONTINUED] prochlorperazine (COMPAZINE) 10 MG tablet TAKE 1 TABLET BY MOUTH EVERY 6 HOURS AS NEEDED FOR NAUSEA OR VOMITING (Patient taking differently: Take 10 mg by mouth every 6 (six) hours as needed for nausea or vomiting. )   Facility-Administered Encounter Medications as of 02/19/2021  Medication   LORazepam (ATIVAN) injection 0.5 mg    Allergy:  Allergies  Allergen Reactions   Dilaudid [Hydromorphone Hcl]  Other (See Comments)    Pt became confused, pulled out iv's, does not remember anything   Depakote [Divalproex Sodium] Nausea And Vomiting   Minocycline Hives   Aspirin Hives    States able to tolerate Goody Powders without any problem except for GI upset    Social Hx:   Social History   Socioeconomic History   Marital status: Single    Spouse name: Not on file   Number of children: Not on file   Years of education: Not on file   Highest education level: Not on file  Occupational History   Not on file  Tobacco Use   Smoking status: Every Day    Packs/day: 0.50    Years: 22.00    Pack years: 11.00    Types: Cigarettes   Smokeless tobacco: Never   Tobacco comments:    Recently started a smoking cessation class.   Vaping Use   Vaping Use: Never used  Substance and Sexual Activity   Alcohol use: Not Currently    Alcohol/week: 0.0 standard drinks    Comment:  hx alcohollism  in  remission since 2014   Drug use: No    Comment: hx  marijuana use   Sexual activity: Yes    Partners: Male    Birth control/protection: Surgical  Other Topics Concern   Not on file  Social History Narrative   Former [facility] patient.      Was on disability at one point.   Return to the workforce.  40 hours a week at [employer], 10 hours a week on the weekends at [employer] in [place] at night 10 to 12 hours a week.      Has grown children, she lives alone with a pet, continues to smoke no alcohol or drug use at this time      History of EtOH abuse and THC use.         Social Determinants of Health   Financial Resource Strain: Not on file  Food Insecurity: Not on file  Transportation Needs: Not on file  Physical Activity: Not on file  Stress: Not on file  Social Connections: Not on file  Intimate Partner Violence: Not on file    Past Surgical Hx:  Past Surgical History:  Procedure Laterality Date   CESAREAN SECTION  1995   w/  Bilateral Tubal Ligation   COLONOSCOPY  last one 08-09-2013   CYSTOSCOPY W/ URETERAL STENT PLACEMENT Left 03/29/2016   Procedure: CYSTOSCOPY WITH STENT REPLACEMENT;  Surgeon: Nickie Retort, MD;  Location: [facility];  Service: Urology;  Laterality: Left;   CYSTOSCOPY WITH RETROGRADE PYELOGRAM, URETEROSCOPY AND STENT PLACEMENT Left 03/08/2016   Procedure: CYSTOSCOPY WITH  LEFT RETROGRADE PYELOGRAM, AND STENT PLACEMENT;  Surgeon: Nickie Retort, MD;  Location: [facility];  Service: Urology;  Laterality: Left;   CYSTOSCOPY/RETROGRADE/URETEROSCOPY/STONE EXTRACTION WITH BASKET Left 03/29/2016   Procedure: CYSTOSCOPY/RETROGRADE/URETEROSCOPY/STONE EXTRACTION WITH BASKET;  Surgeon: Nickie Retort, MD;  Location: [facility];  Service: Urology;  Laterality: Left;   ENDOMETRIAL ABLATION W/ NOVASURE  04-01-2010   ESOPHAGOGASTRODUODENOSCOPY  last one 08-09-2013   KNEE ARTHROSCOPY Left as teen   LASER  ABLATION OF THE CERVIX  2012 approx   MASTECTOMY WITH AXILLARY LYMPH NODE DISSECTION Left 07/27/2020   Procedure: LEFT MASTECTOMY WITH LEFT RADIOACTIVE SEED GUIDED TARGETED AXILLARY LYMPH NODE DISSECTION;  Surgeon: Alphonsa Overall, MD;  Location: [place];  Service: General;  Laterality: Left;   [place]  Right 03/06/2020   Procedure: INSERTION PORT-A-CATH WITH ULTRASOUND GUIDANCE;  Surgeon: Alphonsa Overall, MD;  Location: [place];  Service: General;  Laterality: Right;   TRANSTHORACIC ECHOCARDIOGRAM  05-19-2006   lvsf normal, ef 55-65%, there was mild flattening of the interventricular septum during diastoli/  RV size at upper limits normal   TUBAL LIGATION     WISDOM TOOTH EXTRACTION  age 35 's    Past Medical Hx:  Past Medical History:  Diagnosis Date   Bipolar 1 disorder ([place])    Cancer ([place])    Complication of anesthesia    wakes up during procedures   Depression    GAD (generalized anxiety disorder)    Genital HSV    currently per pt  no break out 03-22-2016    GERD (gastroesophageal reflux disease)    Hiatal hernia    History of cervical dysplasia    2012 laser ablation   History of esophageal dilatation    for dysphasia -- x2 dilated   History of gastric ulcer    History of Helicobacter pylori infection    remote hx   History of hidradenitis suppurativa    "gets all over body intermittantly"     History of hypertension    no issue since stopped drinking alcohol 2014   History of kidney stones    History of panic attacks    History of radiation therapy 03/16/2020-05/08/2020   vulva  Dr Gery Pray   History of radiation therapy 09/10/2020-10/23/2020   left breast  Dr Gery Pray   Hypertension    Iron deficiency anemia    Left ureteral stone    OCD (obsessive compulsive disorder)    PONV (postoperative nausea and vomiting)    PTSD (post-traumatic stress disorder)    Recovering alcoholic in remission ([place])    since 2014   RLS (restless legs  syndrome)    Smokers' cough ([place])    Urgency of urination    Yeast infection involving the vagina and surrounding area    secondary to taking antibiotic    Past Gynecological History:  See HPI, hx of cesarean sections No LMP recorded. Patient has had an ablation.  Family Hx:  Family History  Problem Relation Age of Onset   Heart disease Father    Lung cancer Father        d. 24   Alcohol abuse Father    Heart disease Mother    Depression Mother    Anxiety disorder Mother    Drug abuse Brother    Alcohol abuse Brother    Drug abuse Brother    ADD / ADHD Brother    Colon polyps Brother    Cancer Paternal Grandfather        "stomach"   Diabetes Maternal Grandfather    Diabetes Paternal Grandmother    Kidney disease Maternal Uncle    Cirrhosis Cousin        alcoholic   Anxiety disorder Maternal Aunt    Depression Maternal Aunt    Cancer Cousin        maternal; ovarian cancer or other "female" cancer?   Lung cancer Paternal Uncle 61   Throat cancer Cousin        paternal; dx 20s   Lung cancer Cousin        paternal; dx 70s    Review of Systems:  Constitutional  + hot flashes   ENT Normal appearing ears and nares bilaterally Skin/Breast  No rash, sores, jaundice, itching,  dryness Cardiovascular  No chest pain, shortness of breath, or edema  Pulmonary  No cough or wheeze.  Gastro Intestinal  No nausea, vomitting, or diarrhoea. No bright red blood per rectum, no abdominal pain, change in bowel movement, or constipation.  Genito Urinary  No frequency, urgency, dysuria, no hematuria, no vulvar pruritis. She has amenorrhea since uterine ablation. Musculo Skeletal  No myalgia, arthralgia, joint swelling or pain  Neurologic  No weakness, numbness, change in gait,  Psychology  No depression, anxiety, insomnia.   Vitals:  Blood pressure (!) 133/92, pulse 93, temperature 97.7 F (36.5 C), temperature source Tympanic, resp. rate 18, height $RemoveBe'5\' 5"'[id number]$  (1.651 m), weight 193  lb 9.6 oz (87.8 kg), SpO2 100 %.  Physical Exam: WD in NAD Neck  Supple NROM, without any enlargements.  Lymph Node Survey No cervical supraclavicular or inguinal adenopathy Cardiovascular  Pulse normal rate, regularity and rhythm. S1 and S2 normal.  Lungs  Clear to auscultation bilateraly, without wheezes/crackles/rhonchi. Good air movement.  Skin  No rash/lesions/breakdown  Psychiatry  Alert and oriented to person, place, and time  Abdomen  Normoactive bowel sounds, abdomen soft, non-tender and mildly obese without evidence of hernia.  Back No CVA tenderness Genito Urinary  Vulva/vagina: Normal external female genitalia.   No lesions. No discharge or bleeding. Acetic acid was applied and no lesions were seen.  There is some rigidity and scarring at the midline perineum.  Bladder/urethra:  No lesions or masses, well supported bladder  Vagina: normal  Cervix: Normal appearing, no lesions.  Uterus:  Small, mobile, no parametrial involvement or nodularity.  Adnexa: no palpable masses. Rectal  No palpable tumor in the rectum, no cul de sac nodularity.  Extremities  No bilateral cyanosis, clubbing or edema.   Sandra Solo, MD  02/19/2021, 4:38 PM

## 2021-02-22 ENCOUNTER — Other Ambulatory Visit: Payer: Self-pay | Admitting: Gynecologic Oncology

## 2021-02-22 ENCOUNTER — Other Ambulatory Visit: Payer: Self-pay | Admitting: *Deleted

## 2021-02-22 DIAGNOSIS — C50212 Malignant neoplasm of upper-inner quadrant of left female breast: Secondary | ICD-10-CM

## 2021-02-22 DIAGNOSIS — Z170 Estrogen receptor positive status [ER+]: Secondary | ICD-10-CM

## 2021-02-22 DIAGNOSIS — C519 Malignant neoplasm of vulva, unspecified: Secondary | ICD-10-CM

## 2021-02-22 DIAGNOSIS — Z1589 Genetic susceptibility to other disease: Secondary | ICD-10-CM

## 2021-02-22 DIAGNOSIS — I159 Secondary hypertension, unspecified: Secondary | ICD-10-CM

## 2021-02-22 NOTE — Assessment & Plan Note (Signed)
Breast cancer and vulvar cancers  02/20/2020:PET scan on 02/07/20 following a diagnosis of vulvar cancer showed a 1.7cm left breast mass and mildly hypermetabolic left axillary lymph nodes. Mammogram and US on 02/18/20 showed a 3.1cm left breast mass at the 11 o'clock position, palpable on exam, and a single abnormal left axillary lymph node with cortical thickening. Left breast biopsy on 02/20/20 showed invasive and in situ carcinoma in the breast and axilla, grade 3, HER-2 positive (3+), ER/PR+ 95%, Ki67 75%  Treatment plan: 1.Neoadjuvant chemoradiation for vulvar cancer with Taxol, carboplatin, Herceptin weekly x6 cycles 2.neoadjuvant chemotherapy with TCH Perjeta x3 cycles 3.07/27/20:Left mastectomy ([facility]): three foci of IDC, 3.7cm, 1.5cm, and 1.2cm, with intermediate to high grade DCIS, clear margins, with 1/2 left axillary lymph nodes positive for carcinoma. 4. Followed by adjuvant radiation therapyto be completed on 10/23/20 5.Kadcyla maintenanceto be completed 04/06/2021 6.Adjuvant antiestrogen therapywith anastrozole started 12/01/2020 7.Adjuvant neratinib ------------------------------------------------------------------------------------------------------------------- Current treatment: Todayiscycle9Kadcyla,with anastrozole 1 mg daily started 12/01/2020 (menopausal) Kadcyla toxicities:Denies any adverse effects to Kadcyla.  Echocardiogram3/16/2022: EF60 to 65%  Pelvic pressure/ cramps:Could be due to colitis.Prescribed gabapentin by gastroenterology.  Anastrozoletoxicities: Diffuse musculoskeletal aches and pains, discontinued it. If her symptoms are better then we will switch her to letrozole therapy.  Return to clinic in 3 weeks for Kadcyla 

## 2021-02-22 NOTE — Progress Notes (Signed)
Patient Care Team: Nicholas Lose, MD as PCP - General (Hematology and Oncology) Mauro Kaufmann, RN as Oncology Nurse Navigator Rockwell Germany, RN as Oncology Nurse Navigator Nicholas Lose, MD as Consulting Physician (Hematology and Oncology) Alphonsa Overall, MD as Consulting Physician (General Surgery) Awanda Mink Craige Cotta, RN as Oncology Nurse Navigator (Oncology)  DIAGNOSIS:    ICD-10-CM   1. Malignant neoplasm of upper-inner quadrant of left breast in female, estrogen receptor positive ([place])  C50.212    Z17.0       SUMMARY OF ONCOLOGIC HISTORY: Oncology History  Vulvar cancer ([place])  01/29/2020 Initial Diagnosis   Vulvar cancer ([place])   03/11/2020 - 05/04/2020 Chemotherapy   The patient had dexamethasone (DECADRON) 4 MG tablet, 4 mg (100 % of original dose 4 mg), Oral, Daily, 1 of 1 cycle, Start date: 02/27/2020, End date: 05/05/2020 Dose modification: 4 mg (original dose 4 mg, Cycle 0) palonosetron (ALOXI) injection 0.25 mg, 0.25 mg, Intravenous,  Once, 1 of 1 cycle Administration: 0.25 mg (03/11/2020), 0.25 mg (03/17/2020), 0.25 mg (03/24/2020), 0.25 mg (03/31/2020), 0.25 mg (04/07/2020), 0.25 mg (04/14/2020), 0.25 mg (04/21/2020) CARBOplatin (PARAPLATIN) 300 mg in sodium chloride 0.9 % 250 mL chemo infusion, 300 mg (100 % of original dose 300 mg), Intravenous,  Once, 1 of 1 cycle Dose modification:   (original dose 300 mg, Cycle 1) Administration: 300 mg (03/11/2020), 290 mg (03/17/2020), 300 mg (03/24/2020), 230 mg (03/31/2020), 230 mg (04/07/2020), 230 mg (04/14/2020) PACLitaxel (TAXOL) 108 mg in sodium chloride 0.9 % 250 mL chemo infusion (</= 48m/m2), 50 mg/m2 = 108 mg, Intravenous,  Once, 1 of 1 cycle Dose modification: 45 mg/m2 (original dose 50 mg/m2, Cycle 1, Reason: Dose not tolerated) Administration: 108 mg (03/11/2020), 108 mg (03/17/2020), 108 mg (03/24/2020), 96 mg (03/31/2020), 96 mg (04/07/2020), 96 mg (04/14/2020) fosaprepitant (EMEND) 150 mg in sodium chloride 0.9 % 145 mL IVPB, 150 mg,  Intravenous,  Once, 1 of 1 cycle Administration: 150 mg (03/24/2020), 150 mg (03/31/2020), 150 mg (04/07/2020), 150 mg (04/14/2020) trastuzumab-anns (KANJINTI) 378 mg in sodium chloride 0.9 % 250 mL chemo infusion, 4 mg/kg = 378 mg (100 % of original dose 4 mg/kg), Intravenous,  Once, 1 of 1 cycle Dose modification: 4 mg/kg (original dose 4 mg/kg, Cycle 1, Reason: Other (see comments), Comment: loading dose) Administration: 378 mg (03/11/2020), 189 mg (03/17/2020), 189 mg (03/24/2020), 189 mg (03/31/2020), 189 mg (04/07/2020), 189 mg (04/14/2020), 189 mg (04/21/2020), 189 mg (05/04/2020)   for chemotherapy treatment.     Malignant neoplasm of upper-inner quadrant of left breast in female, estrogen receptor positive ([place]  02/20/2020 Initial Diagnosis   PET scan on 02/07/20 following a diagnosis of vulvar cancer showed a 1.7cm left breast mass and mildly hypermetabolic left axillary lymph nodes. [place] showed a 3.1cm left breast mass at the 11 o'clock position, palpable on exam, and a single abnormal left axillary lymph node with cortical thickening. Left breast biopsy on 02/20/20 showed invasive and in situ carcinoma in the breast and axilla, grade 3, HER-2 positive (3+), ER/PR+ 95%, Ki67 75%   02/25/2020 Cancer Staging   Staging form: Breast, AJCC 8th Edition - Clinical stage from 02/25/2020: Stage IB (cT2, cN1, cM0, G3, ER+, PR+, HER2+) - Signed by GNicholas Lose MD on 02/25/2020    05/29/2020 - 05/29/2020 Chemotherapy   The patient had palonosetron (ALOXI) injection 0.25 mg, 0.25 mg, Intravenous,  Once, 1 of 3 cycles Administration: 0.25 mg (05/29/2020) pegfilgrastim-jmdb (FULPHILA) injection 6 mg, 6 mg, Subcutaneous,  Once, 1 of 3 cycles CARBOplatin (PARAPLATIN) 580 mg in sodium chloride 0.9 % 250 mL chemo infusion, 580 mg (100 % of original dose 580 mg), Intravenous,  Once, 1 of 3 cycles Dose modification:   (original dose 580 mg, Cycle 1) Administration: 580 mg (05/29/2020) DOCEtaxel  (TAXOTERE) 120 mg in sodium chloride 0.9 % 250 mL chemo infusion, 60 mg/m2 = 120 mg (100 % of original dose 60 mg/m2), Intravenous,  Once, 1 of 3 cycles Dose modification: 60 mg/m2 (original dose 60 mg/m2, Cycle 1, Reason: Provider Judgment), 50 mg/m2 (original dose 60 mg/m2, Cycle 2, Reason: Dose not tolerated) Administration: 120 mg (05/29/2020) fosaprepitant (EMEND) 150 mg in sodium chloride 0.9 % 145 mL IVPB, 150 mg, Intravenous,  Once, 1 of 3 cycles Administration: 150 mg (05/29/2020) pertuzumab (PERJETA) 420 mg in sodium chloride 0.9 % 250 mL chemo infusion, 420 mg (50 % of original dose 840 mg), Intravenous, Once, 1 of 3 cycles Dose modification: 420 mg (original dose 840 mg, Cycle 1, Reason: Provider Judgment) Administration: 420 mg (05/29/2020) trastuzumab-dkst (OGIVRI) 525 mg in sodium chloride 0.9 % 250 mL chemo infusion, 6 mg/kg = 525 mg (75 % of original dose 8 mg/kg), Intravenous,  Once, 1 of 3 cycles Dose modification: 6 mg/kg (original dose 8 mg/kg, Cycle 1, Reason: Provider Judgment) Administration: 525 mg (05/29/2020)   for chemotherapy treatment.     06/22/2020 Genetic Testing   Positive genetic testing: Heterozygous pathogenic variant detected in BRIP1 gene at c.2010dup (p.Glu671*).  Variant of uncertain significance detected in POLD1 at c.532G>A (p.Gly178Arg).  No other pathogenic variants detected in [facility] Common Hereditary Cancers Panel.  The report date is June 22, 2020.   The Common Hereditary Cancers Panel offered by [facility] includes sequencing and/or deletion duplication testing of the following 48 genes: APC, ATM, AXIN2, BARD1, BMPR1A, BRCA1, BRCA2, BRIP1, CDH1, CDK4, CDKN2A (p14ARF), CDKN2A (p16INK4a), CHEK2, CTNNA1, DICER1, EPCAM (Deletion/duplication testing only), GREM1 (promoter region deletion/duplication testing only), KIT, MEN1, MLH1, MSH2, MSH3, MSH6, MUTYH, NBN, NF1, NHTL1, PALB2, PDGFRA, PMS2, POLD1, POLE, PTEN, RAD50, RAD51C, RAD51D, RNF43, SDHB, SDHC, SDHD,  SMAD4, SMARCA4. STK11, TP53, TSC1, TSC2, and VHL.  The following genes were evaluated for sequence changes only: SDHA and HOXB13 c.251G>A variant only.   07/27/2020 Surgery   Left mastectomy [name]): three foci of IDC, 3.7cm, 1.5cm, and 1.2cm, with intermediate to high grade DCIS, clear margins, with 1/2 left axillary lymph nodes positive for carcinoma.    08/18/2020 -  Chemotherapy    Patient is on Treatment Plan: BREAST ADO-TRASTUZUMAB EMTANSINE [facility]) Q21D       09/09/2020 - 10/23/2020 Radiation Therapy   Adjuvant radiation     CHIEF COMPLIANT:  Kadcyla maintenance  INTERVAL HISTORY: Sandra Miller is a 45 y.o. with above-mentioned history of left breast cancer and vulvar cancer who completed neoadjuvant chemotherapy, underwent a left mastectomy, radiation, and is currently on Kadcyla maintenance. She also has a history of DVT currently on Xarelto. She presents to the clinic today for treatment.  Since she stopped anastrozole her joint aches and stiffness went away.  She is doing much better today.  She still has some stiffness in the fingers. She informed me that she will undergo hysterectomy and bilateral salpingo-oophorectomy sometime in September.  ALLERGIES:  is allergic to dilaudid [hydromorphone hcl], depakote [divalproex sodium], minocycline, and aspirin.  MEDICATIONS:  Current Outpatient Medications  Medication Sig Dispense Refill   acetaminophen (TYLENOL) 500 MG tablet Take 2 tablets (1,000 mg total) by mouth every 6 (six)  hours as needed for moderate pain or fever. Avoid tylenol until your liver function back to normal 30 tablet 0   anastrozole (ARIMIDEX) 1 MG tablet TAKE 1 TABLET BY MOUTH DAILY. 90 tablet 3   Cholecalciferol (D3-1000 PO) Take 1 tablet by mouth daily.     diphenoxylate-atropine (LOMOTIL) 2.5-0.025 MG tablet TAKE 1 TO 2 TABLETS BY MOUTH FOUR TIMES DAILY AS NEEDED FOR DIARRHEA (Patient taking differently: Take 1-2 tablets by mouth 4 (four) times daily as  needed for diarrhea or loose stools. TAKE 1 TO 2 TABLETS BY MOUTH FOUR TIMES DAILY AS NEEDED FOR DIARRHEA) 45 tablet 1   doxylamine, Sleep, (UNISOM) 25 MG tablet Take 25 mg by mouth as needed.     dronabinol (MARINOL) 2.5 MG capsule Take 1 capsule (2.5 mg total) by mouth 2 (two) times daily before a meal. 60 capsule 0   gabapentin (NEURONTIN) 300 MG capsule TAKE 1 CAPSULE BY MOUTH ONCE A DAY 30 capsule 5   lidocaine-prilocaine (EMLA) cream Apply to affected area once 30 g 3   lisinopril (ZESTRIL) 20 MG tablet TAKE 1/2 TABLET BY MOUTH ONCE A DAY 90 tablet 3   LORazepam (ATIVAN) 1 MG tablet Take 1 tablet (1 mg total) by mouth 2 (two) times daily as needed for anxiety. 60 tablet 2   magnesium oxide (MAG-OX) 400 (241.3 Mg) MG tablet Take 1 tablet (400 mg total) by mouth 2 (two) times daily. 60 tablet 1   Magnesium Oxide 400 (240 Mg) MG TABS TAKE 1 TABLET BY MOUTH ONCE DAILY 30 tablet 1   Multiple Vitamin (MULTIVITAMIN WITH MINERALS) TABS tablet Take 1 tablet by mouth daily.     nicotine (NICODERM CQ - DOSED IN MG/24 HOURS) 14 mg/24hr patch PLACE 1 PATCH ONTO THE SKIN DAILY 28 patch 1   OLANZapine (ZYPREXA) 10 MG tablet TAKE 1 TABLET BY MOUTH AT BEDTIME 90 tablet 0   omeprazole (PRILOSEC) 40 MG capsule TAKE 1 CAPSULE BY MOUTH EVERY MORNING AND EVERY NIGHT AT BEDTIME 60 capsule 3   promethazine (PHENERGAN) 25 MG tablet TAKE 1 TABLET BY MOUTH EVERY 6 HOURS AS NEEDED FOR NAUSEA 30 tablet 3   rivaroxaban (XARELTO) 20 MG TABS tablet TAKE 1 TABLET BY MOUTH DAILY WITH SUPPER. 30 tablet 6   rosuvastatin (CRESTOR) 10 MG tablet TAKE 1 TABLET BY MOUTH ONCE A DAY 90 tablet 3   senna-docusate (SENOKOT-S) 8.6-50 MG tablet Take 2 tablets by mouth at bedtime. For AFTER surgery only, do not take if having diarrhea 30 tablet 0   traMADol (ULTRAM) 50 MG tablet Take 1 tablet (50 mg total) by mouth every 6 (six) hours as needed for severe pain. For AFTER surgery only, do not take and drive 15 tablet 0   venlafaxine XR  (EFFEXOR-XR) 37.5 MG 24 hr capsule TAKE 3 CAPSULES BY MOUTH ONCE A DAY 90 capsule 2   No current facility-administered medications for this visit.   Facility-Administered Medications Ordered in Other Visits  Medication Dose Route Frequency Provider Last Rate Last Admin   LORazepam (ATIVAN) injection 0.5 mg  0.5 mg Intravenous Once Harle Stanford., PA-C        PHYSICAL EXAMINATION: ECOG PERFORMANCE STATUS: 1 - Symptomatic but completely ambulatory  Vitals:   02/23/21 1141  BP: 129/75  Pulse: 85  Resp: 18  Temp: 97.7 F (36.5 C)  SpO2: 100%   Filed Weights   02/23/21 1141  Weight: 193 lb (87.5 kg)     LABORATORY DATA:  I have reviewed  the data as listed CMP Latest Ref Rng & Units 02/02/2021 01/12/2021 12/22/2020  Glucose 70 - 99 mg/dL 104(H) 93 78  BUN 6 - 20 mg/dL $Remove'10 11 13  'iiniMPA$ Creatinine 0.44 - 1.00 mg/dL 0.73 0.74 1.08(H)  Sodium 135 - 145 mmol/L 139 140 138  Potassium 3.5 - 5.1 mmol/L 3.6 3.9 4.0  Chloride 98 - 111 mmol/L 102 103 102  CO2 22 - 32 mmol/L $RemoveB'27 26 27  'rPGRDJMU$ Calcium 8.9 - 10.3 mg/dL 10.3 9.9 10.1  Total Protein 6.5 - 8.1 g/dL 7.7 7.8 7.8  Total Bilirubin 0.3 - 1.2 mg/dL 0.4 0.3 0.3  Alkaline Phos 38 - 126 U/L 62 61 49  AST 15 - 41 U/L $Remo'27 26 28  'JoizA$ ALT 0 - 44 U/L $Remo'22 20 26    'tjkit$ Lab Results  Component Value Date   WBC 7.2 02/23/2021   HGB 11.9 (L) 02/23/2021   HCT 35.1 (L) 02/23/2021   MCV 88.0 02/23/2021   PLT 299 02/23/2021   NEUTROABS 4.6 02/23/2021    ASSESSMENT & PLAN:  Malignant neoplasm of upper-inner quadrant of left breast in female, estrogen receptor positive ([place]) Breast cancer and vulvar cancers   02/20/2020:PET scan on 02/07/20 following a diagnosis of vulvar cancer showed a 1.7cm left breast mass and mildly hypermetabolic left axillary lymph nodes. Mammogram and [place] on 02/18/20 showed a 3.1cm left breast mass at the 11 o'clock position, palpable on exam, and a single abnormal left axillary lymph node with cortical thickening. Left breast biopsy on 02/20/20  showed invasive and in situ carcinoma in the breast and axilla, grade 3, HER-2 positive (3+), ER/PR+ 95%, Ki67 75%   Treatment plan: 1.  Neoadjuvant chemoradiation for vulvar cancer with Taxol, carboplatin, Herceptin weekly x6 cycles 2. neoadjuvant chemotherapy with TCH Perjeta x3 cycles 3. 07/27/20: Left mastectomy [name]): three foci of IDC, 3.7cm, 1.5cm, and 1.2cm, with intermediate to high grade DCIS, clear margins, with 1/2 left axillary lymph nodes positive for carcinoma. 4. Followed by adjuvant radiation therapy to be completed on 10/23/20 5.  Kadcyla maintenance to be completed 04/06/2021 6.  Adjuvant antiestrogen therapy with anastrozole started 12/01/2020 7.  Adjuvant neratinib ------------------------------------------------------------------------------------------------------------------- Current treatment: Today is cycle 9 Kadcyla, with anastrozole 1 mg daily started 12/01/2020 (menopausal) Kadcyla toxicities: Denies any adverse effects to Kadcyla.    Echocardiogram 11/18/2020: EF 60 to 65%   Pelvic pressure/ cramps: Could be due to colitis.  Prescribed gabapentin by gastroenterology.   Anastrozole toxicities:  Diffuse musculoskeletal aches and pains, discontinued it.  Since her symptoms are better, we will switch her to letrozole therapy.  I sent for 30-day supply and we will see if she tolerates it well then we can send a 90-day supply.   Return to clinic in 3 weeks for Kadcyla.  She has 2 more treatments of Kadcyla left after today.  Once she undergoes hysterectomy, we can start her on neratinib.  No orders of the defined types were placed in this encounter.  The patient has a good understanding of the overall plan. she agrees with it. she will call with any problems that may develop before the next visit here.  Total time spent: 30 mins including face to face time and time spent for planning, charting and coordination of care  Rulon Eisenmenger, MD, MPH 02/23/2021  I,  Thana Ates, am acting as scribe for Dr. Nicholas Lose.  I have reviewed the above documentation for accuracy and completeness, and I agree with the above.

## 2021-02-23 ENCOUNTER — Other Ambulatory Visit: Payer: Self-pay

## 2021-02-23 ENCOUNTER — Encounter: Payer: Self-pay | Admitting: Hematology and Oncology

## 2021-02-23 ENCOUNTER — Inpatient Hospital Stay: Payer: PRIVATE HEALTH INSURANCE

## 2021-02-23 ENCOUNTER — Inpatient Hospital Stay (HOSPITAL_BASED_OUTPATIENT_CLINIC_OR_DEPARTMENT_OTHER): Payer: PRIVATE HEALTH INSURANCE | Admitting: Hematology and Oncology

## 2021-02-23 ENCOUNTER — Other Ambulatory Visit (HOSPITAL_COMMUNITY): Payer: Self-pay

## 2021-02-23 DIAGNOSIS — Z95828 Presence of other vascular implants and grafts: Secondary | ICD-10-CM

## 2021-02-23 DIAGNOSIS — Z170 Estrogen receptor positive status [ER+]: Secondary | ICD-10-CM | POA: Diagnosis not present

## 2021-02-23 DIAGNOSIS — Z5112 Encounter for antineoplastic immunotherapy: Secondary | ICD-10-CM | POA: Diagnosis not present

## 2021-02-23 DIAGNOSIS — C50212 Malignant neoplasm of upper-inner quadrant of left female breast: Secondary | ICD-10-CM

## 2021-02-23 LAB — CMP (CANCER CENTER ONLY)
ALT: 23 U/L (ref 0–44)
AST: 28 U/L (ref 15–41)
Albumin: 4.4 g/dL (ref 3.5–5.0)
Alkaline Phosphatase: 62 U/L (ref 38–126)
Anion gap: 7 (ref 5–15)
BUN: 11 mg/dL (ref 6–20)
CO2: 27 mmol/L (ref 22–32)
Calcium: 9.9 mg/dL (ref 8.9–10.3)
Chloride: 104 mmol/L (ref 98–111)
Creatinine: 0.72 mg/dL (ref 0.44–1.00)
GFR, Estimated: >60 mL/min (ref >60)
Glucose, Bld: 96 mg/dL (ref 70–99)
Potassium: 3.5 mmol/L (ref 3.5–5.1)
Sodium: 138 mmol/L (ref 135–145)
Total Bilirubin: 0.3 mg/dL (ref 0.3–1.2)
Total Protein: 7.9 g/dL (ref 6.5–8.1)

## 2021-02-23 LAB — CBC WITH DIFFERENTIAL (CANCER CENTER ONLY)
Abs Immature Granulocytes: 0.03 10*3/uL (ref 0.00–0.07)
Basophils Absolute: 0 10*3/uL (ref 0.0–0.1)
Basophils Relative: 0 %
Eosinophils Absolute: 0.2 10*3/uL (ref 0.0–0.5)
Eosinophils Relative: 3 %
HCT: 35.1 % — ABNORMAL LOW (ref 36.0–46.0)
Hemoglobin: 11.9 g/dL — ABNORMAL LOW (ref 12.0–15.0)
Immature Granulocytes: 0 %
Lymphocytes Relative: 26 %
Lymphs Abs: 1.8 10*3/uL (ref 0.7–4.0)
MCH: 29.8 pg (ref 26.0–34.0)
MCHC: 33.9 g/dL (ref 30.0–36.0)
MCV: 88 fL (ref 80.0–100.0)
Monocytes Absolute: 0.5 10*3/uL (ref 0.1–1.0)
Monocytes Relative: 7 %
Neutro Abs: 4.6 10*3/uL (ref 1.7–7.7)
Neutrophils Relative %: 64 %
Platelet Count: 299 10*3/uL (ref 150–400)
RBC: 3.99 MIL/uL (ref 3.87–5.11)
RDW: 14.2 % (ref 11.5–15.5)
WBC Count: 7.2 10*3/uL (ref 4.0–10.5)
nRBC: 0 % (ref 0.0–0.2)

## 2021-02-23 MED ORDER — SODIUM CHLORIDE 0.9 % IV SOLN
Freq: Once | INTRAVENOUS | Status: AC
Start: 2021-02-23 — End: 2021-02-23
  Filled 2021-02-23: qty 250

## 2021-02-23 MED ORDER — HEPARIN SOD (PORK) LOCK FLUSH 100 UNIT/ML IV SOLN
500.0000 [IU] | Freq: Once | INTRAVENOUS | Status: AC | PRN
  Administered 2021-02-23: 500 [IU]
  Filled 2021-02-23: qty 5

## 2021-02-23 MED ORDER — ACETAMINOPHEN 325 MG PO TABS
ORAL_TABLET | ORAL | Status: AC
  Filled 2021-02-23: qty 2

## 2021-02-23 MED ORDER — DIPHENHYDRAMINE HCL 50 MG/ML IJ SOLN
INTRAMUSCULAR | Status: AC
  Filled 2021-02-23: qty 1

## 2021-02-23 MED ORDER — ACETAMINOPHEN 325 MG PO TABS
650.0000 mg | ORAL_TABLET | Freq: Once | ORAL | Status: AC
Start: 2021-02-23 — End: 2021-02-23
  Administered 2021-02-23: 650 mg via ORAL

## 2021-02-23 MED ORDER — DIPHENHYDRAMINE HCL 25 MG PO CAPS
ORAL_CAPSULE | ORAL | Status: AC
  Filled 2021-02-23: qty 2

## 2021-02-23 MED ORDER — NICOTINE 7 MG/24HR TD PT24
7.0000 mg | MEDICATED_PATCH | Freq: Every day | TRANSDERMAL | 0 refills | Status: DC
  Filled 2021-02-23: qty 28, 28d supply, fill #0

## 2021-02-23 MED ORDER — SODIUM CHLORIDE 0.9 % IV SOLN
3.6000 mg/kg | Freq: Once | INTRAVENOUS | Status: AC
  Administered 2021-02-23: 300 mg via INTRAVENOUS
  Filled 2021-02-23: qty 15

## 2021-02-23 MED ORDER — LETROZOLE 2.5 MG PO TABS
2.5000 mg | ORAL_TABLET | Freq: Every day | ORAL | 0 refills | Status: DC
  Filled 2021-02-23: qty 30, 30d supply, fill #0

## 2021-02-23 MED ORDER — SODIUM CHLORIDE 0.9% FLUSH
10.0000 mL | INTRAVENOUS | Status: DC | PRN
  Administered 2021-02-23: 10 mL
  Filled 2021-02-23: qty 10

## 2021-02-23 MED ORDER — DIPHENHYDRAMINE HCL 25 MG PO CAPS
50.0000 mg | ORAL_CAPSULE | Freq: Once | ORAL | Status: AC
  Administered 2021-02-23: 50 mg via ORAL

## 2021-02-23 MED ORDER — SODIUM CHLORIDE 0.9% FLUSH
10.0000 mL | Freq: Once | INTRAVENOUS | Status: AC
  Administered 2021-02-23: 10 mL
  Filled 2021-02-23: qty 10

## 2021-02-23 NOTE — Patient Instructions (Signed)
Implanted Port Home Guide An implanted port is a device that is placed under the skin. It is usually placed in the chest. The device can be used to give IV medicine, to take blood, or for dialysis. You may have an implanted port if: You need IV medicine that would be irritating to the small veins in your hands or arms. You need IV medicines, such as antibiotics, for a long period of time. You need IV nutrition for a long period of time. You need dialysis. When you have a port, your health care provider can choose to use the port instead of veins in your arms for these procedures. You may have fewer limitations when using a port than you would if you used other types of long-term IVs, and you will likely be able to return to normal activities afteryour incision heals. An implanted port has two main parts: Reservoir. The reservoir is the part where a needle is inserted to give medicines or draw blood. The reservoir is round. After it is placed, it appears as a small, raised area under your skin. Catheter. The catheter is a thin, flexible tube that connects the reservoir to a vein. Medicine that is inserted into the reservoir goes into the catheter and then into the vein. How is my port accessed? To access your port: A numbing cream may be placed on the skin over the port site. Your health care provider will put on a mask and sterile gloves. The skin over your port will be cleaned carefully with a germ-killing soap and allowed to dry. Your health care provider will gently pinch the port and insert a needle into it. Your health care provider will check for a blood return to make sure the port is in the vein and is not clogged. If your port needs to remain accessed to get medicine continuously (constant infusion), your health care provider will place a clear bandage (dressing) over the needle site. The dressing and needle will need to be changed every week, or as told by your health care provider. What  is flushing? Flushing helps keep the port from getting clogged. Follow instructions from your health care provider about how and when to flush the port. Ports are usually flushed with saline solution or a medicine called heparin. The need for flushing will depend on how the port is used: If the port is only used from time to time to give medicines or draw blood, the port may need to be flushed: Before and after medicines have been given. Before and after blood has been drawn. As part of routine maintenance. Flushing may be recommended every 4-6 weeks. If a constant infusion is running, the port may not need to be flushed. Throw away any syringes in a disposal container that is meant for sharp items (sharps container). You can buy a sharps container from a pharmacy, or you can make one by using an empty hard plastic bottle with a cover. How long will my port stay implanted? The port can stay in for as long as your health care provider thinks it is needed. When it is time for the port to come out, a surgery will be done to remove it. The surgery will be similar to the procedure that was done to putthe port in. Follow these instructions at home:  Flush your port as told by your health care provider. If you need an infusion over several days, follow instructions from your health care provider about how to take   care of your port site. Make sure you: Wash your hands with soap and water before you change your dressing. If soap and water are not available, use alcohol-based hand sanitizer. Change your dressing as told by your health care provider. Place any used dressings or infusion bags into a plastic bag. Throw that bag in the trash. Keep the dressing that covers the needle clean and dry. Do not get it wet. Do not use scissors or sharp objects near the tube. Keep the tube clamped, unless it is being used. Check your port site every day for signs of infection. Check for: Redness, swelling, or  pain. Fluid or blood. Pus or a bad smell. Protect the skin around the port site. Avoid wearing bra straps that rub or irritate the site. Protect the skin around your port from seat belts. Place a soft pad over your chest if needed. Bathe or shower as told by your health care provider. The site may get wet as long as you are not actively receiving an infusion. Return to your normal activities as told by your health care provider. Ask your health care provider what activities are safe for you. Carry a medical alert card or wear a medical alert bracelet at all times. This will let health care providers know that you have an implanted port in case of an emergency. Get help right away if: You have redness, swelling, or pain at the port site. You have fluid or blood coming from your port site. You have pus or a bad smell coming from the port site. You have a fever. Summary Implanted ports are usually placed in the chest for long-term IV access. Follow instructions from your health care provider about flushing the port and changing bandages (dressings). Take care of the area around your port by avoiding clothing that puts pressure on the area, and by watching for signs of infection. Protect the skin around your port from seat belts. Place a soft pad over your chest if needed. Get help right away if you have a fever or you have redness, swelling, pain, drainage, or a bad smell at the port site. This information is not intended to replace advice given to you by your health care provider. Make sure you discuss any questions you have with your healthcare provider. Document Revised: 01/06/2020 Document Reviewed: 01/06/2020 Elsevier Patient Education  2022 [employer]  

## 2021-02-26 ENCOUNTER — Other Ambulatory Visit (HOSPITAL_COMMUNITY): Payer: Self-pay

## 2021-03-01 ENCOUNTER — Encounter: Payer: Self-pay | Admitting: Hematology and Oncology

## 2021-03-01 ENCOUNTER — Telehealth (INDEPENDENT_AMBULATORY_CARE_PROVIDER_SITE_OTHER): Payer: Self-pay | Admitting: Psychiatry

## 2021-03-01 ENCOUNTER — Encounter (HOSPITAL_COMMUNITY): Payer: Self-pay | Admitting: Psychiatry

## 2021-03-01 ENCOUNTER — Other Ambulatory Visit: Payer: Self-pay

## 2021-03-01 ENCOUNTER — Other Ambulatory Visit (HOSPITAL_COMMUNITY): Payer: Self-pay

## 2021-03-01 DIAGNOSIS — F319 Bipolar disorder, unspecified: Secondary | ICD-10-CM

## 2021-03-01 DIAGNOSIS — F411 Generalized anxiety disorder: Secondary | ICD-10-CM

## 2021-03-01 MED ORDER — OLANZAPINE 7.5 MG PO TABS
7.5000 mg | ORAL_TABLET | Freq: Every day | ORAL | 2 refills | Status: DC
  Filled 2021-03-01: qty 30, 30d supply, fill #0
  Filled 2021-04-08: qty 30, 30d supply, fill #1
  Filled 2021-05-02 – 2021-05-03 (×2): qty 30, 30d supply, fill #2

## 2021-03-01 MED ORDER — VENLAFAXINE HCL ER 37.5 MG PO CP24
ORAL_CAPSULE | ORAL | 2 refills | Status: DC
  Filled 2021-03-01: qty 90, fill #0
  Filled 2021-03-30: qty 90, 30d supply, fill #0
  Filled 2021-05-02: qty 90, 30d supply, fill #1
  Filled 2021-06-02: qty 90, 30d supply, fill #2

## 2021-03-01 MED ORDER — LORAZEPAM 1 MG PO TABS
1.0000 mg | ORAL_TABLET | Freq: Three times a day (TID) | ORAL | 2 refills | Status: DC | PRN
  Filled 2021-03-01 – 2021-03-30 (×2): qty 90, 30d supply, fill #0
  Filled 2021-05-02: qty 90, 30d supply, fill #1
  Filled 2021-06-02: qty 90, 30d supply, fill #2

## 2021-03-01 NOTE — Progress Notes (Signed)
Virtual Visit via Telephone Note  I connected with [place] on 03/01/21 at 10:40 AM EDT by telephone and verified that I am speaking with the correct person using two identifiers.  Location: Patient: Home Provider: Home Office   I discussed the limitations, risks, security and privacy concerns of performing an evaluation and management service by telephone and the availability of in person appointments. I also discussed with the patient that there may be a patient responsible charge related to this service. The patient expressed understanding and agreed to proceed.   History of Present Illness: Patient is evaluated by phone session.  She noticed for past few weeks more irritable, angry and aggravated for no reason.  She is going through chemotherapy and hoping it will and on August 2 but that is scheduled to have a hysterectomy in September.  Patient recently had a visit with her oncologist and there was a discussion that her breast tumor has ability to spread to the brain and she is very nervous and worried.  She is hoping that does not need more treatment.  Her oncologist started the medication and one of the side effects is mood changes and she noticed having more anger and mood swings.  She admitted that she should not have read the side effects because now she is focused on negativity.  She had a good support from the family.  She is working 40 hours and her job is going well.  She actually like working because it keep things off from her mind.  She is working as a Quarry manager at [employer].  She denies any hallucination, paranoia, suicidal thoughts or homicidal thoughts.  She admitted sometime fatigue and lack of energy.  We have cut down her Ativan because she was sleeping too much.  It helped her excessive sleep but she noticed her anxiety coming back and she is more irritable.  Her appetite is okay.  Her BUN/creatinine is stable.  She denies drinking or using any illegal substances.  She  lives by herself and she had a very good support from her family members.  Her daughter, granddaughter talked and visit most every day.  Her son who moved to [place] talks to him regularly.   Past Psychiatric History:  H/O bipolar disorder, alcohol dependence and marijuana abuse.  H/O at least 6 inpatient. Tried lithium, Seroquel, Lamictal, Paxil, Celexa, Abilify, Geodon, Klonopin, Depakote, Neurontin and Cymbalta.  No H/o history of suicidal attempt.    Psychiatric Specialty Exam: Physical Exam  Review of Systems  Weight 193 lb (87.5 kg).Body mass index is 32.12 kg/m.  General Appearance: NA  Eye Contact:  NA  Speech:  Slow  Volume:  Decreased  Mood:  Anxious and Irritable  Affect:  NA  Thought Process:  Goal Directed  Orientation:  Full (Time, Place, and Person)  Thought Content:  Rumination  Suicidal Thoughts:  No  Homicidal Thoughts:  No  Memory:  Immediate;   Good Recent;   Good Remote;   Good  Judgement:  Intact  Insight:  Present  Psychomotor Activity:  NA  Concentration:  Concentration: Good and Attention Span: Good  Recall:  Good  Fund of Knowledge:  Good  Language:  Good  Akathisia:  No  Handed:  Right  AIMS (if indicated):     Assets:  Communication Skills Desire for Improvement Housing Resilience Social Support Transportation  ADL's:  Intact  Cognition:  WNL  Sleep:   ok     Assessment and Plan:  Bipolar disorder type I.  Generalized anxiety disorder.  I discussed current medication.  She started a new medication for the cancer which she believes causing more irritability and anger.  She is hoping to finish her chemotherapy on August 2 and like to proceed hysterectomy in September.  Patient admitted that there is a long process because she may need to take the medication for a long time.  I recommend she can go back on Ativan 1 mg 3 times a day as currently she is taking twice a day and we can reduce olanzapine from 10mg  to 7.5 mg as adjusting and  titrating up of Ativan was causing too much sleep.  Patient agreed with the plan.  She is taking gabapentin 300 mg given by GI to help her nausea.  Talk about possibility of increasing gabapentin 400 mg if increased Ativan and decrease olanzapine did not work.  I also encouraged to look into therapy but lately patient has been very busy with doctor's appointment and working 40 hours a week.  However she will consider and let us know if needed therapy.  We will continue venlafaxine 112.5 mg daily.  Recommended to call us back if she has any question, concern if she feels worsening of the symptoms.  Follow up in 3 months.  Follow Up Instructions:    I discussed the assessment and treatment plan with the patient. The patient was provided an opportunity to ask questions and all were answered. The patient agreed with the plan and demonstrated an understanding of the instructions.   The patient was advised to call back or seek an in-person evaluation if the symptoms worsen or if the condition fails to improve as anticipated.  I provided 19 minutes of non-face-to-face time during this encounter.   Kathlee Nations, MD

## 2021-03-02 ENCOUNTER — Other Ambulatory Visit (HOSPITAL_COMMUNITY): Payer: Self-pay

## 2021-03-08 MED FILL — Rivaroxaban Tab 20 MG: ORAL | 30 days supply | Qty: 30 | Fill #3 | Status: AC

## 2021-03-08 MED FILL — Gabapentin Cap 300 MG: ORAL | 30 days supply | Qty: 30 | Fill #2 | Status: AC

## 2021-03-09 ENCOUNTER — Other Ambulatory Visit (HOSPITAL_COMMUNITY): Payer: Self-pay

## 2021-03-09 ENCOUNTER — Telehealth: Payer: Self-pay

## 2021-03-09 MED FILL — Omeprazole Cap Delayed Release 40 MG: ORAL | 30 days supply | Qty: 60 | Fill #1 | Status: AC

## 2021-03-09 MED FILL — Promethazine HCl Tab 25 MG: ORAL | 7 days supply | Qty: 30 | Fill #1 | Status: AC

## 2021-03-09 NOTE — Telephone Encounter (Signed)
Pt called to report she hit head on recliner last night and is under active treatment and takes anticoagulation.   Patient is s/p D1C10 Kadcyla, and takes Xarelto 20mg  tablets.    Patient denies any headache, vision changes, nausea, and never loss consciousness. Alert and oriented  No knot/bruising at site of injury.  Patient reports, "I was leaning over my bed to pick up a blanket and slide out of bed and the left side of my head hit the recliner, not hard, just want to make sure you were aware."   RN notified MD.  MD recommendations for patient to continue to monitor for any changes with headache, vision, or nausea.  No imaging recommended at this time. RN notified patient, patient verbalized understanding and agreement.  No further needs at this time.

## 2021-03-10 ENCOUNTER — Ambulatory Visit (HOSPITAL_COMMUNITY)
Admission: RE | Admit: 2021-03-10 | Discharge: 2021-03-10 | Disposition: A | Discharge status: Home / Self Care | Payer: PRIVATE HEALTH INSURANCE | Source: Ambulatory Visit | Attending: Hematology and Oncology | Admitting: Hematology and Oncology

## 2021-03-10 ENCOUNTER — Other Ambulatory Visit: Payer: Self-pay

## 2021-03-10 DIAGNOSIS — F17200 Nicotine dependence, unspecified, uncomplicated: Secondary | ICD-10-CM | POA: Diagnosis not present

## 2021-03-10 DIAGNOSIS — Z170 Estrogen receptor positive status [ER+]: Secondary | ICD-10-CM | POA: Diagnosis not present

## 2021-03-10 DIAGNOSIS — Z0189 Encounter for other specified special examinations: Secondary | ICD-10-CM

## 2021-03-10 DIAGNOSIS — C519 Malignant neoplasm of vulva, unspecified: Secondary | ICD-10-CM

## 2021-03-10 DIAGNOSIS — C50212 Malignant neoplasm of upper-inner quadrant of left female breast: Secondary | ICD-10-CM | POA: Diagnosis not present

## 2021-03-10 DIAGNOSIS — E785 Hyperlipidemia, unspecified: Secondary | ICD-10-CM | POA: Diagnosis not present

## 2021-03-10 DIAGNOSIS — I159 Secondary hypertension, unspecified: Secondary | ICD-10-CM | POA: Diagnosis not present

## 2021-03-10 LAB — ECHOCARDIOGRAM COMPLETE
Area-P 1/2: 3.48 cm2
Calc EF: 51.7 %
S' Lateral: 3 cm
Single Plane A2C EF: 50.9 %
Single Plane A4C EF: 52 %

## 2021-03-10 NOTE — Progress Notes (Signed)
  Echocardiogram 2D Echocardiogram has been performed.  Sandra Miller 03/10/2021, 10:17 AM

## 2021-03-15 NOTE — Progress Notes (Signed)
Patient Care Team: Nicholas Lose, MD as PCP - General (Hematology and Oncology) Mauro Kaufmann, RN as Oncology Nurse Navigator Rockwell Germany, RN as Oncology Nurse Navigator Nicholas Lose, MD as Consulting Physician (Hematology and Oncology) Alphonsa Overall, MD as Consulting Physician (General Surgery) Awanda Mink Craige Cotta, RN as Oncology Nurse Navigator (Oncology)  DIAGNOSIS:    ICD-10-CM   1. Malignant neoplasm of upper-inner quadrant of left breast in female, estrogen receptor positive ([place])  C50.212    Z17.0       SUMMARY OF ONCOLOGIC HISTORY: Oncology History  Vulvar cancer ([place])  01/29/2020 Initial Diagnosis   Vulvar cancer ([place])   03/11/2020 - 05/04/2020 Chemotherapy   The patient had dexamethasone (DECADRON) 4 MG tablet, 4 mg (100 % of original dose 4 mg), Oral, Daily, 1 of 1 cycle, Start date: 02/27/2020, End date: 05/05/2020 Dose modification: 4 mg (original dose 4 mg, Cycle 0) palonosetron (ALOXI) injection 0.25 mg, 0.25 mg, Intravenous,  Once, 1 of 1 cycle Administration: 0.25 mg (03/11/2020), 0.25 mg (03/17/2020), 0.25 mg (03/24/2020), 0.25 mg (03/31/2020), 0.25 mg (04/07/2020), 0.25 mg (04/14/2020), 0.25 mg (04/21/2020) CARBOplatin (PARAPLATIN) 300 mg in sodium chloride 0.9 % 250 mL chemo infusion, 300 mg (100 % of original dose 300 mg), Intravenous,  Once, 1 of 1 cycle Dose modification:   (original dose 300 mg, Cycle 1) Administration: 300 mg (03/11/2020), 290 mg (03/17/2020), 300 mg (03/24/2020), 230 mg (03/31/2020), 230 mg (04/07/2020), 230 mg (04/14/2020) PACLitaxel (TAXOL) 108 mg in sodium chloride 0.9 % 250 mL chemo infusion (</= 48m/m2), 50 mg/m2 = 108 mg, Intravenous,  Once, 1 of 1 cycle Dose modification: 45 mg/m2 (original dose 50 mg/m2, Cycle 1, Reason: Dose not tolerated) Administration: 108 mg (03/11/2020), 108 mg (03/17/2020), 108 mg (03/24/2020), 96 mg (03/31/2020), 96 mg (04/07/2020), 96 mg (04/14/2020) fosaprepitant (EMEND) 150 mg in sodium chloride 0.9 % 145 mL IVPB, 150 mg,  Intravenous,  Once, 1 of 1 cycle Administration: 150 mg (03/24/2020), 150 mg (03/31/2020), 150 mg (04/07/2020), 150 mg (04/14/2020) trastuzumab-anns (KANJINTI) 378 mg in sodium chloride 0.9 % 250 mL chemo infusion, 4 mg/kg = 378 mg (100 % of original dose 4 mg/kg), Intravenous,  Once, 1 of 1 cycle Dose modification: 4 mg/kg (original dose 4 mg/kg, Cycle 1, Reason: Other (see comments), Comment: loading dose) Administration: 378 mg (03/11/2020), 189 mg (03/17/2020), 189 mg (03/24/2020), 189 mg (03/31/2020), 189 mg (04/07/2020), 189 mg (04/14/2020), 189 mg (04/21/2020), 189 mg (05/04/2020)   for chemotherapy treatment.     Malignant neoplasm of upper-inner quadrant of left breast in female, estrogen receptor positive ([place]  02/20/2020 Initial Diagnosis   PET scan on 02/07/20 following a diagnosis of vulvar cancer showed a 1.7cm left breast mass and mildly hypermetabolic left axillary lymph nodes. [place]ogram and [place] showed a 3.1cm left breast mass at the 11 o'clock position, palpable on exam, and a single abnormal left axillary lymph node with cortical thickening. Left breast biopsy on 02/20/20 showed invasive and in situ carcinoma in the breast and axilla, grade 3, HER-2 positive (3+), ER/PR+ 95%, Ki67 75%   02/25/2020 Cancer Staging   Staging form: Breast, AJCC 8th Edition - Clinical stage from 02/25/2020: Stage IB (cT2, cN1, cM0, G3, ER+, PR+, HER2+) - Signed by GNicholas Lose MD on 02/25/2020    05/29/2020 - 05/29/2020 Chemotherapy   The patient had palonosetron (ALOXI) injection 0.25 mg, 0.25 mg, Intravenous,  Once, 1 of 3 cycles Administration: 0.25 mg (05/29/2020) pegfilgrastim-jmdb (FULPHILA) injection 6 mg, 6 mg, Subcutaneous,  Once, 1 of 3 cycles CARBOplatin (PARAPLATIN) 580 mg in sodium chloride 0.9 % 250 mL chemo infusion, 580 mg (100 % of original dose 580 mg), Intravenous,  Once, 1 of 3 cycles Dose modification:   (original dose 580 mg, Cycle 1) Administration: 580 mg (05/29/2020) DOCEtaxel  (TAXOTERE) 120 mg in sodium chloride 0.9 % 250 mL chemo infusion, 60 mg/m2 = 120 mg (100 % of original dose 60 mg/m2), Intravenous,  Once, 1 of 3 cycles Dose modification: 60 mg/m2 (original dose 60 mg/m2, Cycle 1, Reason: Provider Judgment), 50 mg/m2 (original dose 60 mg/m2, Cycle 2, Reason: Dose not tolerated) Administration: 120 mg (05/29/2020) fosaprepitant (EMEND) 150 mg in sodium chloride 0.9 % 145 mL IVPB, 150 mg, Intravenous,  Once, 1 of 3 cycles Administration: 150 mg (05/29/2020) pertuzumab (PERJETA) 420 mg in sodium chloride 0.9 % 250 mL chemo infusion, 420 mg (50 % of original dose 840 mg), Intravenous, Once, 1 of 3 cycles Dose modification: 420 mg (original dose 840 mg, Cycle 1, Reason: Provider Judgment) Administration: 420 mg (05/29/2020) trastuzumab-dkst (OGIVRI) 525 mg in sodium chloride 0.9 % 250 mL chemo infusion, 6 mg/kg = 525 mg (75 % of original dose 8 mg/kg), Intravenous,  Once, 1 of 3 cycles Dose modification: 6 mg/kg (original dose 8 mg/kg, Cycle 1, Reason: Provider Judgment) Administration: 525 mg (05/29/2020)   for chemotherapy treatment.     06/22/2020 Genetic Testing   Positive genetic testing: Heterozygous pathogenic variant detected in BRIP1 gene at c.2010dup (p.Glu671*).  Variant of uncertain significance detected in POLD1 at c.532G>A (p.Gly178Arg).  No other pathogenic variants detected in [facility] Common Hereditary Cancers Panel.  The report date is June 22, 2020.   The Common Hereditary Cancers Panel offered by [facility] includes sequencing and/or deletion duplication testing of the following 48 genes: APC, ATM, AXIN2, BARD1, BMPR1A, BRCA1, BRCA2, BRIP1, CDH1, CDK4, CDKN2A (p14ARF), CDKN2A (p16INK4a), CHEK2, CTNNA1, DICER1, EPCAM (Deletion/duplication testing only), GREM1 (promoter region deletion/duplication testing only), KIT, MEN1, MLH1, MSH2, MSH3, MSH6, MUTYH, NBN, NF1, NHTL1, PALB2, PDGFRA, PMS2, POLD1, POLE, PTEN, RAD50, RAD51C, RAD51D, RNF43, SDHB, SDHC, SDHD,  SMAD4, SMARCA4. STK11, TP53, TSC1, TSC2, and VHL.  The following genes were evaluated for sequence changes only: SDHA and HOXB13 c.251G>A variant only.   07/27/2020 Surgery   Left mastectomy [name]): three foci of IDC, 3.7cm, 1.5cm, and 1.2cm, with intermediate to high grade DCIS, clear margins, with 1/2 left axillary lymph nodes positive for carcinoma.    08/18/2020 -  Chemotherapy    Patient is on Treatment Plan: BREAST ADO-TRASTUZUMAB EMTANSINE [facility]) Q21D       09/09/2020 - 10/23/2020 Radiation Therapy   Adjuvant radiation     CHIEF COMPLIANT: Kadcyla maintenance  INTERVAL HISTORY: Sandra Miller is a 45 y.o. with above-mentioned history of left breast cancer and vulvar cancer who completed neoadjuvant chemotherapy, underwent a left mastectomy, radiation, and is currently on Kadcyla maintenance. She also has a history of DVT currently on Xarelto. She presents to the clinic today for treatment.  She was also started on letrozole therapy and she is tolerating it very well.  She is going to undergo hysterectomy and bilateral salpingo-oophorectomy in September.  ALLERGIES:  is allergic to dilaudid [hydromorphone hcl], depakote [divalproex sodium], minocycline, and aspirin.  MEDICATIONS:  Current Outpatient Medications  Medication Sig Dispense Refill   acetaminophen (TYLENOL) 500 MG tablet Take 2 tablets (1,000 mg total) by mouth every 6 (six) hours as needed for moderate pain or fever. Avoid tylenol until your liver function back to  normal 30 tablet 0   Cholecalciferol (D3-1000 PO) Take 1 tablet by mouth daily.     diphenoxylate-atropine (LOMOTIL) 2.5-0.025 MG tablet TAKE 1 TO 2 TABLETS BY MOUTH FOUR TIMES DAILY AS NEEDED FOR DIARRHEA (Patient taking differently: Take 1-2 tablets by mouth 4 (four) times daily as needed for diarrhea or loose stools. TAKE 1 TO 2 TABLETS BY MOUTH FOUR TIMES DAILY AS NEEDED FOR DIARRHEA) 45 tablet 1   doxylamine, Sleep, (UNISOM) 25 MG tablet Take 25 mg by  mouth as needed.     dronabinol (MARINOL) 2.5 MG capsule Take 1 capsule (2.5 mg total) by mouth 2 (two) times daily before a meal. 60 capsule 0   gabapentin (NEURONTIN) 300 MG capsule TAKE 1 CAPSULE BY MOUTH ONCE A DAY 30 capsule 5   letrozole (FEMARA) 2.5 MG tablet Take 1 tablet (2.5 mg total) by mouth daily. 90 tablet 3   lidocaine-prilocaine (EMLA) cream Apply to affected area once 30 g 3   lisinopril (ZESTRIL) 20 MG tablet TAKE 1/2 TABLET BY MOUTH ONCE A DAY 90 tablet 3   LORazepam (ATIVAN) 1 MG tablet Take 1 tablet (1 mg total) by mouth 3 (three) times daily as needed for anxiety. 90 tablet 2   magnesium oxide (MAG-OX) 400 (241.3 Mg) MG tablet Take 1 tablet (400 mg total) by mouth 2 (two) times daily. 60 tablet 1   Magnesium Oxide 400 (240 Mg) MG TABS TAKE 1 TABLET BY MOUTH ONCE DAILY 30 tablet 1   Multiple Vitamin (MULTIVITAMIN WITH MINERALS) TABS tablet Take 1 tablet by mouth daily.     nicotine (NICODERM CQ - DOSED IN MG/24 HR) 7 mg/24hr patch Place 1 patch (7 mg total) onto the skin daily. 28 patch 0   OLANZapine (ZYPREXA) 7.5 MG tablet Take 1 tablet (7.5 mg total) by mouth at bedtime. 30 tablet 2   omeprazole (PRILOSEC) 40 MG capsule TAKE 1 CAPSULE BY MOUTH EVERY MORNING AND EVERY NIGHT AT BEDTIME 60 capsule 3   promethazine (PHENERGAN) 25 MG tablet TAKE 1 TABLET BY MOUTH EVERY 6 HOURS AS NEEDED FOR NAUSEA 30 tablet 3   rivaroxaban (XARELTO) 20 MG TABS tablet TAKE 1 TABLET BY MOUTH DAILY WITH SUPPER. 30 tablet 6   rosuvastatin (CRESTOR) 10 MG tablet TAKE 1 TABLET BY MOUTH ONCE A DAY 90 tablet 3   senna-docusate (SENOKOT-S) 8.6-50 MG tablet Take 2 tablets by mouth at bedtime. For AFTER surgery only, do not take if having diarrhea 30 tablet 0   traMADol (ULTRAM) 50 MG tablet Take 1 tablet (50 mg total) by mouth every 6 (six) hours as needed for severe pain. For AFTER surgery only, do not take and drive 15 tablet 0   venlafaxine XR (EFFEXOR-XR) 37.5 MG 24 hr capsule TAKE 3 CAPSULES BY MOUTH  ONCE A DAY 90 capsule 2   No current facility-administered medications for this visit.   Facility-Administered Medications Ordered in Other Visits  Medication Dose Route Frequency Provider Last Rate Last Admin   LORazepam (ATIVAN) injection 0.5 mg  0.5 mg Intravenous Once Harle Stanford., PA-C        PHYSICAL EXAMINATION: ECOG PERFORMANCE STATUS: 1 - Symptomatic but completely ambulatory  Vitals:   03/16/21 1107  BP: 133/83  Pulse: 91  Resp: 19  Temp: 97.7 F (36.5 C)  SpO2: 100%   Filed Weights   03/16/21 1107  Weight: 192 lb 1.6 oz (87.1 kg)     LABORATORY DATA:  I have reviewed the data as listed CMP  Latest Ref Rng & Units 03/16/2021 02/23/2021 02/02/2021  Glucose 70 - 99 mg/dL 90 96 104(H)  BUN 6 - 20 mg/dL _0 Creatinine 0.44 - 1.00 mg/dL 0.78 0.72 0.73  Sodium 135 - 145 mmol/L 140 138 139  Potassium 3.5 - 5.1 mmol/L 3.8 3.5 3.6  Chloride 98 - 111 mmol/L 102 104 102  CO2 22 - 32 mmol/L _1 Calcium 8.9 - 10.3 mg/dL 10.8(H) 9.9 10.3  Total Protein 6.5 - 8.1 g/dL 8.5(H) 7.9 7.7  Total Bilirubin 0.3 - 1.2 mg/dL 0.4 0.3 0.4  Alkaline Phos 38 - 126 U/L 78 62 62  AST 15 - 41 U/L _2 ALT 0 - 44 U/L _3 Lab Results  Component Value Date   WBC 8.0 03/16/2021   HGB 13.2 03/16/2021   HCT 38.7 03/16/2021   MCV 88.0 03/16/2021   PLT 322 03/16/2021   NEUTROABS 4.9 03/16/2021    ASSESSMENT & PLAN:  Malignant neoplasm of upper-inner quadrant of left breast in female, estrogen receptor positive ([place]) Breast cancer and vulvar cancers   02/20/2020:PET scan on 02/07/20 following a diagnosis of vulvar cancer showed a 1.7cm left breast mass and mildly hypermetabolic left axillary lymph nodes. Mammogram and [place] on 02/18/20 showed a 3.1cm left breast mass at the 11 o'clock position, palpable on exam, and a single abnormal left axillary lymph node with cortical thickening. Left breast biopsy on 02/20/20 showed invasive and in situ carcinoma in the breast and  axilla, grade 3, HER-2 positive (3+), ER/PR+ 95%, Ki67 75%   Treatment plan: 1.  Neoadjuvant chemoradiation for vulvar cancer with Taxol, carboplatin, Herceptin weekly x6 cycles 2. neoadjuvant chemotherapy with TCH Perjeta x3 cycles 3. 07/27/20: Left mastectomy [name]): three foci of IDC, 3.7cm, 1.5cm, and 1.2cm, with intermediate to high grade DCIS, clear margins, with 1/2 left axillary lymph nodes positive for carcinoma. 4. Followed by adjuvant radiation therapy to be completed on 10/23/20 5.  Kadcyla maintenance to be completed 04/06/2021 6.  Adjuvant antiestrogen therapy with anastrozole started 12/01/2020 switched to letrozole 02/23/2021 7.  Adjuvant neratinib ------------------------------------------------------------------------------------------------------------------- Current treatment: Today is cycle 11 Kadcyla, with anastrozole 1 mg daily started 12/01/2020 (menopausal) Kadcyla toxicities: Denies any adverse effects to Kadcyla.    Echocardiogram 11/18/2020: EF 60 to 65%   Letrozole toxicities: She is tolerating it extremely well without any problems. Patient expects to undergo bilateral salpingo-oophorectomy and hysterectomy in September 2022. Return to clinic in 3 weeks for her last cycle of Kadcyla.  I will request Dr. Serita Grit team to remove her port at the time of her hysterectomy surgery.  No orders of the defined types were placed in this encounter.  The patient has a good understanding of the overall plan. she agrees with it. she will call with any problems that may develop before the next visit here.  Total time spent: 30 mins including face to face time and time spent for planning, charting and coordination of care  Rulon Eisenmenger, MD, MPH 03/16/2021  I, Thana Ates, am acting as scribe for Dr. Nicholas Lose.  I have reviewed the above documentation for accuracy and completeness, and I agree with the above.

## 2021-03-16 ENCOUNTER — Encounter: Payer: Self-pay | Admitting: Hematology and Oncology

## 2021-03-16 ENCOUNTER — Other Ambulatory Visit: Payer: Self-pay

## 2021-03-16 ENCOUNTER — Inpatient Hospital Stay: Payer: PRIVATE HEALTH INSURANCE | Attending: Gynecologic Oncology

## 2021-03-16 ENCOUNTER — Inpatient Hospital Stay (HOSPITAL_BASED_OUTPATIENT_CLINIC_OR_DEPARTMENT_OTHER): Payer: PRIVATE HEALTH INSURANCE | Admitting: Hematology and Oncology

## 2021-03-16 ENCOUNTER — Other Ambulatory Visit (HOSPITAL_COMMUNITY): Payer: Self-pay

## 2021-03-16 ENCOUNTER — Inpatient Hospital Stay: Payer: PRIVATE HEALTH INSURANCE

## 2021-03-16 VITALS — BP 131/81 | HR 77 | Temp 97.6°F | Resp 18

## 2021-03-16 DIAGNOSIS — Z5112 Encounter for antineoplastic immunotherapy: Secondary | ICD-10-CM | POA: Insufficient documentation

## 2021-03-16 DIAGNOSIS — Z170 Estrogen receptor positive status [ER+]: Secondary | ICD-10-CM

## 2021-03-16 DIAGNOSIS — Z79899 Other long term (current) drug therapy: Secondary | ICD-10-CM | POA: Insufficient documentation

## 2021-03-16 DIAGNOSIS — C50212 Malignant neoplasm of upper-inner quadrant of left female breast: Secondary | ICD-10-CM | POA: Diagnosis not present

## 2021-03-16 DIAGNOSIS — Z95828 Presence of other vascular implants and grafts: Secondary | ICD-10-CM

## 2021-03-16 LAB — CBC WITH DIFFERENTIAL (CANCER CENTER ONLY)
Abs Immature Granulocytes: 0.04 10*3/uL (ref 0.00–0.07)
Basophils Absolute: 0 10*3/uL (ref 0.0–0.1)
Basophils Relative: 1 %
Eosinophils Absolute: 0.2 10*3/uL (ref 0.0–0.5)
Eosinophils Relative: 3 %
HCT: 38.7 % (ref 36.0–46.0)
Hemoglobin: 13.2 g/dL (ref 12.0–15.0)
Immature Granulocytes: 1 %
Lymphocytes Relative: 28 %
Lymphs Abs: 2.2 10*3/uL (ref 0.7–4.0)
MCH: 30 pg (ref 26.0–34.0)
MCHC: 34.1 g/dL (ref 30.0–36.0)
MCV: 88 fL (ref 80.0–100.0)
Monocytes Absolute: 0.6 10*3/uL (ref 0.1–1.0)
Monocytes Relative: 7 %
Neutro Abs: 4.9 10*3/uL (ref 1.7–7.7)
Neutrophils Relative %: 60 %
Platelet Count: 322 10*3/uL (ref 150–400)
RBC: 4.4 MIL/uL (ref 3.87–5.11)
RDW: 14.3 % (ref 11.5–15.5)
WBC Count: 8 10*3/uL (ref 4.0–10.5)
nRBC: 0 % (ref 0.0–0.2)

## 2021-03-16 LAB — CMP (CANCER CENTER ONLY)
ALT: 23 U/L (ref 0–44)
AST: 27 U/L (ref 15–41)
Albumin: 4.3 g/dL (ref 3.5–5.0)
Alkaline Phosphatase: 78 U/L (ref 38–126)
Anion gap: 9 (ref 5–15)
BUN: 10 mg/dL (ref 6–20)
CO2: 29 mmol/L (ref 22–32)
Calcium: 10.8 mg/dL — ABNORMAL HIGH (ref 8.9–10.3)
Chloride: 102 mmol/L (ref 98–111)
Creatinine: 0.78 mg/dL (ref 0.44–1.00)
GFR, Estimated: >60 mL/min (ref >60)
Glucose, Bld: 90 mg/dL (ref 70–99)
Potassium: 3.8 mmol/L (ref 3.5–5.1)
Sodium: 140 mmol/L (ref 135–145)
Total Bilirubin: 0.4 mg/dL (ref 0.3–1.2)
Total Protein: 8.5 g/dL — ABNORMAL HIGH (ref 6.5–8.1)

## 2021-03-16 MED ORDER — LETROZOLE 2.5 MG PO TABS
2.5000 mg | ORAL_TABLET | Freq: Every day | ORAL | 3 refills | Status: DC
  Filled 2021-03-16: qty 90, 90d supply, fill #0
  Filled 2021-07-03: qty 90, 90d supply, fill #1

## 2021-03-16 MED ORDER — ACETAMINOPHEN 325 MG PO TABS
ORAL_TABLET | ORAL | Status: AC
  Filled 2021-03-16: qty 2

## 2021-03-16 MED ORDER — DIPHENHYDRAMINE HCL 25 MG PO CAPS
50.0000 mg | ORAL_CAPSULE | Freq: Once | ORAL | Status: AC
  Administered 2021-03-16: 50 mg via ORAL

## 2021-03-16 MED ORDER — HEPARIN SOD (PORK) LOCK FLUSH 100 UNIT/ML IV SOLN
500.0000 [IU] | Freq: Once | INTRAVENOUS | Status: AC | PRN
  Administered 2021-03-16: 500 [IU]
  Filled 2021-03-16: qty 5

## 2021-03-16 MED ORDER — SODIUM CHLORIDE 0.9 % IV SOLN
Freq: Once | INTRAVENOUS | Status: AC
  Filled 2021-03-16: qty 250

## 2021-03-16 MED ORDER — ACETAMINOPHEN 325 MG PO TABS
650.0000 mg | ORAL_TABLET | Freq: Once | ORAL | Status: AC
  Administered 2021-03-16: 650 mg via ORAL

## 2021-03-16 MED ORDER — SODIUM CHLORIDE 0.9 % IV SOLN
3.6000 mg/kg | Freq: Once | INTRAVENOUS | Status: AC
  Administered 2021-03-16: 300 mg via INTRAVENOUS
  Filled 2021-03-16: qty 15

## 2021-03-16 MED ORDER — DIPHENHYDRAMINE HCL 25 MG PO CAPS
ORAL_CAPSULE | ORAL | Status: AC
  Filled 2021-03-16: qty 2

## 2021-03-16 MED ORDER — SODIUM CHLORIDE 0.9% FLUSH
10.0000 mL | INTRAVENOUS | Status: DC | PRN
Start: 2021-03-16 — End: 2021-03-16
  Administered 2021-03-16: 10 mL
  Filled 2021-03-16: qty 10

## 2021-03-16 MED ORDER — SODIUM CHLORIDE 0.9% FLUSH
10.0000 mL | Freq: Once | INTRAVENOUS | Status: AC
  Administered 2021-03-16: 10 mL
  Filled 2021-03-16: qty 10

## 2021-03-16 NOTE — Progress Notes (Signed)
Pt declined 30 min observation. VSS at discharge. 

## 2021-03-16 NOTE — Assessment & Plan Note (Signed)
Breast cancer and vulvar cancers  02/20/2020:PET scan on 02/07/20 following a diagnosis of vulvar cancer showed a 1.7cm left breast mass and mildly hypermetabolic left axillary lymph nodes. Mammogram and [place] on 02/18/20 showed a 3.1cm left breast mass at the 11 o'clock position, palpable on exam, and a single abnormal left axillary lymph node with cortical thickening. Left breast biopsy on 02/20/20 showed invasive and in situ carcinoma in the breast and axilla, grade 3, HER-2 positive (3+), ER/PR+ 95%, Ki67 75%  Treatment plan: 1.Neoadjuvant chemoradiation for vulvar cancer with Taxol, carboplatin, Herceptin weekly x6 cycles 2.neoadjuvant chemotherapy with TCH Perjeta x3 cycles 3.07/27/20:Left mastectomy [name]): three foci of IDC, 3.7cm, 1.5cm, and 1.2cm, with intermediate to high grade DCIS, clear margins, with 1/2 left axillary lymph nodes positive for carcinoma. 4. Followed by adjuvant radiation therapyto be completed on 10/23/20 5.Kadcyla maintenanceto be completed 04/06/2021 6.Adjuvant antiestrogen therapywith anastrozole started 12/01/2020 switched to letrozole 02/23/2021 7.Adjuvant neratinib ------------------------------------------------------------------------------------------------------------------- Current treatment: Todayiscycle9Kadcyla,with anastrozole 1 mg daily started 12/01/2020 (menopausal) Kadcyla toxicities:Denies any adverse effects to Kadcyla.  Echocardiogram3/16/2022: EF60 to 65%  Letrozole toxicities:  Breast cancer surveillance: 1.  Breast exam 03/16/2021: Benign 2.

## 2021-03-16 NOTE — Patient Instructions (Signed)
[facility]  Discharge Instructions: °Thank you for choosing [facility] to provide your oncology and hematology care.  ° °If you have a lab appointment with the Cancer Center, please go directly to the Cancer Center and check in at the registration area. °  °Wear comfortable clothing and clothing appropriate for easy access to any Portacath or PICC line.  ° °We strive to give you quality time with your provider. You may need to reschedule your appointment if you arrive late (15 or more minutes).  Arriving late affects you and other patients whose appointments are after yours.  Also, if you miss three or more appointments without notifying the office, you may be dismissed from the clinic at the provider’s discretion.    °  °For prescription refill requests, have your pharmacy contact our office and allow 72 hours for refills to be completed.   ° °Today you received the following chemotherapy and/or immunotherapy agents Kadcyla    °  °To help prevent nausea and vomiting after your treatment, we encourage you to take your nausea medication as directed. ° °BELOW ARE SYMPTOMS THAT SHOULD BE REPORTED IMMEDIATELY: °*FEVER GREATER THAN 100.4 F (38 °C) OR HIGHER °*CHILLS OR SWEATING °*NAUSEA AND VOMITING THAT IS NOT CONTROLLED WITH YOUR NAUSEA MEDICATION °*UNUSUAL SHORTNESS OF BREATH °*UNUSUAL BRUISING OR BLEEDING °*URINARY PROBLEMS (pain or burning when urinating, or frequent urination) °*BOWEL PROBLEMS (unusual diarrhea, constipation, pain near the anus) °TENDERNESS IN MOUTH AND THROAT WITH OR WITHOUT PRESENCE OF ULCERS (sore throat, sores in mouth, or a toothache) °UNUSUAL RASH, SWELLING OR PAIN  °UNUSUAL VAGINAL DISCHARGE OR ITCHING  ° °Items with * indicate a potential emergency and should be followed up as soon as possible or go to the Emergency Department if any problems should occur. ° °Please show the CHEMOTHERAPY ALERT CARD or IMMUNOTHERAPY ALERT CARD at check-in to the  Emergency Department and triage nurse. ° °Should you have questions after your visit or need to cancel or reschedule your appointment, please contact [facility]  Dept: [phone]  and follow the prompts.  Office hours are 8:00 a.m. to 4:30 p.m. Monday - Friday. Please note that voicemails left after 4:00 p.m. may not be returned until the following business day.  We are closed weekends and major holidays. You have access to a nurse at all times for urgent questions. Please call the main number to the clinic Dept: [phone] and follow the prompts. ° ° °For any non-urgent questions, you may also contact your provider using MyChart. We now offer e-Visits for anyone 18 and older to request care online for non-urgent symptoms. For details visit [email or web address]. °  °Also download the MyChart app! Go to the app store, search "MyChart", open the app, select [facility], and log in with your MyChart username and password. ° °Due to Covid, a mask is required upon entering the hospital/clinic. If you do not have a mask, one will be given to you upon arrival. For doctor visits, patients may have 1 support person aged 18 or older with them. For treatment visits, patients cannot have anyone with them due to current Covid guidelines and our immunocompromised population.  ° °

## 2021-03-17 ENCOUNTER — Other Ambulatory Visit (HOSPITAL_COMMUNITY): Payer: Self-pay

## 2021-03-17 ENCOUNTER — Encounter: Payer: Self-pay | Admitting: Hematology and Oncology

## 2021-03-25 ENCOUNTER — Encounter: Payer: Self-pay | Admitting: Hematology and Oncology

## 2021-03-30 ENCOUNTER — Encounter: Payer: Self-pay | Admitting: Hematology and Oncology

## 2021-03-30 ENCOUNTER — Encounter: Payer: Self-pay | Admitting: Family Medicine

## 2021-03-30 ENCOUNTER — Ambulatory Visit (INDEPENDENT_AMBULATORY_CARE_PROVIDER_SITE_OTHER): Payer: PRIVATE HEALTH INSURANCE | Admitting: Family Medicine

## 2021-03-30 ENCOUNTER — Other Ambulatory Visit (HOSPITAL_COMMUNITY): Payer: Self-pay

## 2021-03-30 ENCOUNTER — Other Ambulatory Visit: Payer: Self-pay

## 2021-03-30 DIAGNOSIS — M25561 Pain in right knee: Secondary | ICD-10-CM | POA: Diagnosis not present

## 2021-03-30 DIAGNOSIS — M25562 Pain in left knee: Secondary | ICD-10-CM | POA: Diagnosis not present

## 2021-03-30 MED ORDER — DICLOFENAC SODIUM 1 % EX GEL
4.0000 g | Freq: Four times a day (QID) | CUTANEOUS | 6 refills | Status: DC | PRN
  Filled 2021-03-30: qty 500, 31d supply, fill #0

## 2021-03-30 NOTE — Progress Notes (Signed)
Office Visit Note   Patient: Sandra Miller           Date of Birth: 03/21/1976           MRN: [id number] Visit Date: 03/30/2021 Requested by: Nicholas Lose, MD [place],  [place] [id number] PCP: Nicholas Lose, MD  Subjective: Chief Complaint  Patient presents with   Left Knee - Pain    Pain in both knees since going back to work in February of this year - had been out for a year due to cancer. Still going through chemo presently. Has had surgery on the left when she was a teenager. Tried knee sleeves -- not helping.    Right Knee - Pain    HPI: She is here with left greater than right knee pain.  Longstanding problems with her knees, but since February the pain has gotten worse.  She is status post arthroscopic debridement of her left knee about 5 or 6 years ago.  She was having mechanical symptoms then and was told that she has some cartilage damage as well as some arthritis.  Her symptoms seem to be returning.  The left knee feels swollen.  She bought an over-the-counter knee brace but it did not help.  She has to be careful with NSAIDs due to chronic GI issues.  She has a history of breast cancer and vulvar cancer.  She has been on chemotherapy for more than a year.  She is going to have hysterectomy and oophorectomy in September.  She has a genetic mutation putting her at risk for these.                ROS:   All other systems were reviewed and are negative.  Objective: Vital Signs: There were no vitals taken for this visit.  Physical Exam:  General:  Alert and oriented, in no acute distress. Pulm:  Breathing unlabored. Psy:  Normal mood, congruent affect.  Knees: There is 1+ effusion on the left, none on the right.  Trace patellofemoral crepitus bilaterally.  Ligaments feel stable.  The left knee has tenderness on the medial joint line.  No palpable click with McMurray's.    Imaging: No results found.  Assessment & Plan: Chronic bilateral knee  pain, left greater than right, with left knee effusion.  Suspicious for meniscus pathology or loose body. -She would really like to avoid surgery if possible.  We will try Voltaren gel topically, a hinged knee brace, glucosamine and turmeric. -If symptoms persist, we will obtain x-rays and possibly consider getting approval for gel injections.  She would like to avoid cortisone.  MRI if all else fails.     Procedures: No procedures performed        PMFS History: Patient Active Problem List   Diagnosis Date Noted   Hyperlipidemia 08/12/2020   Pre-diabetes 08/12/2020   Hypomagnesemia 08/12/2020   Breast cancer, stage 2, left ([place]) 07/27/2020   Mutation in BRIP1 gene 06/24/2020   Genetic testing 06/23/2020   Hypokalemia 06/17/2020   N&V (nausea and vomiting) 06/17/2020   Dehydration    DVT (deep venous thrombosis) ([place]) 06/16/2020   Colitis 06/16/2020   Acute embolism and thrombosis of right internal jugular vein ([place]) 06/12/2020   Port-A-Cath in place 03/11/2020   Malignant neoplasm of upper-inner quadrant of left breast in female, estrogen receptor positive ([place]) 02/25/2020   Vulvar cancer ([place]) 01/29/2020   Vulvar ulceration 01/23/2020   Essential hypertension 10/31/2019   Nausea and  vomiting 12/27/2018   Seasonal allergic rhinitis 12/27/2018   History of ELISA positive for HSV 10/01/2018   Restless leg syndrome 04/01/2016   Nephrolithiasis 03/08/2016   Obstructive pyelonephritis 03/08/2016   Normocytic anemia 03/08/2016   Healthcare maintenance 12/24/2015   Hidradenitis suppurativa of left axilla 11/03/2015   Loss of weight 09/08/2015   Neck pain on left side 10/22/2014   Genital warts 05/20/2014   GERD (gastroesophageal reflux disease) 12/26/2012   Functional dyspepsia 12/26/2012   Tobacco abuse 08/22/2012   H/O: HTN (hypertension) 03/19/2009   Herpes simplex virus (HSV) infection 02/03/2007   DISORDER, BIPOLAR NOS 02/03/2007   Past Medical History:  Diagnosis  Date   Bipolar 1 disorder ([place])    Cancer ([place])    Complication of anesthesia    wakes up during procedures   Depression    GAD (generalized anxiety disorder)    Genital HSV    currently per pt  no break out 03-22-2016    GERD (gastroesophageal reflux disease)    Hiatal hernia    History of cervical dysplasia    2012 laser ablation   History of esophageal dilatation    for dysphasia -- x2 dilated   History of gastric ulcer    History of Helicobacter pylori infection    remote hx   History of hidradenitis suppurativa    "gets all over body intermittantly"     History of hypertension    no issue since stopped drinking alcohol 2014   History of kidney stones    History of panic attacks    History of radiation therapy 03/16/2020-05/08/2020   vulva  Dr Gery Pray   History of radiation therapy 09/10/2020-10/23/2020   left breast  Dr Gery Pray   Hypertension    Iron deficiency anemia    Left ureteral stone    OCD (obsessive compulsive disorder)    PONV (postoperative nausea and vomiting)    PTSD (post-traumatic stress disorder)    Recovering alcoholic in remission ([place])    since 2014   RLS (restless legs syndrome)    Smokers' cough ([place])    Urgency of urination    Yeast infection involving the vagina and surrounding area    secondary to taking antibiotic    Family History  Problem Relation Age of Onset   Heart disease Father    Lung cancer Father        d. 74   Alcohol abuse Father    Heart disease Mother    Depression Mother    Anxiety disorder Mother    Drug abuse Brother    Alcohol abuse Brother    Drug abuse Brother    ADD / ADHD Brother    Colon polyps Brother    Cancer Paternal Grandfather        "stomach"   Diabetes Maternal Grandfather    Diabetes Paternal Grandmother    Kidney disease Maternal Uncle    Cirrhosis Cousin        alcoholic   Anxiety disorder Maternal Aunt    Depression Maternal Aunt    Cancer Cousin        maternal; ovarian cancer or  other "female" cancer?   Lung cancer Paternal Uncle 35   Throat cancer Cousin        paternal; dx 73s   Lung cancer Cousin        paternal; dx 1s    Past Surgical History:  Procedure Laterality Date   [place]   w/  Bilateral Tubal Ligation   COLONOSCOPY  last one 08-09-2013   CYSTOSCOPY W/ URETERAL STENT PLACEMENT Left 03/29/2016   Procedure: CYSTOSCOPY WITH STENT REPLACEMENT;  Surgeon: Nickie Retort, MD;  Location: [facility];  Service: Urology;  Laterality: Left;   CYSTOSCOPY WITH RETROGRADE PYELOGRAM, URETEROSCOPY AND STENT PLACEMENT Left 03/08/2016   Procedure: CYSTOSCOPY WITH  LEFT RETROGRADE PYELOGRAM, AND STENT PLACEMENT;  Surgeon: Nickie Retort, MD;  Location: [facility];  Service: Urology;  Laterality: Left;   CYSTOSCOPY/RETROGRADE/URETEROSCOPY/STONE EXTRACTION WITH BASKET Left 03/29/2016   Procedure: CYSTOSCOPY/RETROGRADE/URETEROSCOPY/STONE EXTRACTION WITH BASKET;  Surgeon: Nickie Retort, MD;  Location: [facility];  Service: Urology;  Laterality: Left;   ENDOMETRIAL ABLATION W/ NOVASURE  04-01-2010   ESOPHAGOGASTRODUODENOSCOPY  last one 08-09-2013   KNEE ARTHROSCOPY Left as teen   LASER ABLATION OF THE CERVIX  2012 approx   MASTECTOMY WITH AXILLARY LYMPH NODE DISSECTION Left 07/27/2020   Procedure: LEFT MASTECTOMY WITH LEFT RADIOACTIVE SEED GUIDED TARGETED AXILLARY LYMPH NODE DISSECTION;  Surgeon: Alphonsa Overall, MD;  Location: [facility];  Service: General;  Laterality: Left;   PORTACATH PLACEMENT Right 03/06/2020   Procedure: INSERTION PORT-A-CATH WITH ULTRASOUND GUIDANCE;  Surgeon: Alphonsa Overall, MD;  Location: [facility];  Service: General;  Laterality: Right;   TRANSTHORACIC ECHOCARDIOGRAM  05-19-2006   lvsf normal, ef 55-65%, there was mild flattening of the interventricular septum during diastoli/  RV size at upper limits normal   TUBAL LIGATION     WISDOM TOOTH EXTRACTION  age 61 's   Social History    Occupational History   Not on file  Tobacco Use   Smoking status: Every Day    Packs/day: 0.50    Years: 22.00    Pack years: 11.00    Types: Cigarettes   Smokeless tobacco: Never   Tobacco comments:    Recently started a smoking cessation class.   Vaping Use   Vaping Use: Never used  Substance and Sexual Activity   Alcohol use: Not Currently    Alcohol/week: 0.0 standard drinks    Comment:  hx alcohollism  in remission since 2014   Drug use: No    Comment: hx  marijuana use   Sexual activity: Yes    Partners: Male    Birth control/protection: Surgical

## 2021-03-30 NOTE — Patient Instructions (Signed)
    For bones:  - Vitamin D3:  5,000 IU daily - Magnesium:  200-400 mg daily - Vitamin K2:  100 mcg daily   For arthritis:  - Glucosamine Sulfate:  1,000 mg twice daily - Turmeric:  500 mg twice daily - Voltaren gel:  4 times daily as needed

## 2021-03-31 ENCOUNTER — Encounter: Payer: Self-pay | Admitting: Hematology and Oncology

## 2021-04-05 NOTE — Assessment & Plan Note (Signed)
Breast cancer and vulvar cancers  02/20/2020:PET scan on 02/07/20 following a diagnosis of vulvar cancer showed a 1.7cm left breast mass and mildly hypermetabolic left axillary lymph nodes. Mammogram and [place] on 02/18/20 showed a 3.1cm left breast mass at the 11 o'clock position, palpable on exam, and a single abnormal left axillary lymph node with cortical thickening. Left breast biopsy on 02/20/20 showed invasive and in situ carcinoma in the breast and axilla, grade 3, HER-2 positive (3+), ER/PR+ 95%, Ki67 75%  Treatment plan: 1.Neoadjuvant chemoradiation for vulvar cancer with Taxol, carboplatin, Herceptin weekly x6 cycles 2.neoadjuvant chemotherapy with TCH Perjeta x3 cycles 3.07/27/20:Left mastectomy [name]): three foci of IDC, 3.7cm, 1.5cm, and 1.2cm, with intermediate to high grade DCIS, clear margins, with 1/2 left axillary lymph nodes positive for carcinoma. 4. Followed by adjuvant radiation therapyto be completed on 10/23/20 5.Kadcyla maintenanceto be completed 04/06/2021 6.Adjuvant antiestrogen therapywith anastrozole started 12/01/2020 switched to letrozole 02/23/2021 7.Adjuvant neratinib ------------------------------------------------------------------------------------------------------------------- Current treatment: Todayiscycle12Kadcyla,with anastrozole 1 mg daily started 12/01/2020 (menopausal) Kadcyla toxicities:Denies any adverse effects to Kadcyla.  Echocardiogram3/16/2022: EF60 to 65%  Letrozole toxicities: She is tolerating it extremely well without any problems. Patient expects to undergo bilateral salpingo-oophorectomy and hysterectomy in September 2022.  RTC in 6 months for follow up

## 2021-04-05 NOTE — Progress Notes (Signed)
Patient Care Team: Nicholas Lose, MD as PCP - General (Hematology and Oncology) Mauro Kaufmann, RN as Oncology Nurse Navigator Rockwell Germany, RN as Oncology Nurse Navigator Nicholas Lose, MD as Consulting Physician (Hematology and Oncology) Alphonsa Overall, MD as Consulting Physician (General Surgery) Awanda Mink Craige Cotta, RN as Oncology Nurse Navigator (Oncology)  DIAGNOSIS:    ICD-10-CM   1. Malignant neoplasm of upper-inner quadrant of left breast in female, estrogen receptor positive ([place])  C50.212    Z17.0       SUMMARY OF ONCOLOGIC HISTORY: Oncology History  Vulvar cancer ([place])  01/29/2020 Initial Diagnosis   Vulvar cancer ([place])   03/11/2020 - 05/04/2020 Chemotherapy   The patient had dexamethasone (DECADRON) 4 MG tablet, 4 mg (100 % of original dose 4 mg), Oral, Daily, 1 of 1 cycle, Start date: 02/27/2020, End date: 05/05/2020 Dose modification: 4 mg (original dose 4 mg, Cycle 0) palonosetron (ALOXI) injection 0.25 mg, 0.25 mg, Intravenous,  Once, 1 of 1 cycle Administration: 0.25 mg (03/11/2020), 0.25 mg (03/17/2020), 0.25 mg (03/24/2020), 0.25 mg (03/31/2020), 0.25 mg (04/07/2020), 0.25 mg (04/14/2020), 0.25 mg (04/21/2020) CARBOplatin (PARAPLATIN) 300 mg in sodium chloride 0.9 % 250 mL chemo infusion, 300 mg (100 % of original dose 300 mg), Intravenous,  Once, 1 of 1 cycle Dose modification:   (original dose 300 mg, Cycle 1) Administration: 300 mg (03/11/2020), 290 mg (03/17/2020), 300 mg (03/24/2020), 230 mg (03/31/2020), 230 mg (04/07/2020), 230 mg (04/14/2020) PACLitaxel (TAXOL) 108 mg in sodium chloride 0.9 % 250 mL chemo infusion (</= 48m/m2), 50 mg/m2 = 108 mg, Intravenous,  Once, 1 of 1 cycle Dose modification: 45 mg/m2 (original dose 50 mg/m2, Cycle 1, Reason: Dose not tolerated) Administration: 108 mg (03/11/2020), 108 mg (03/17/2020), 108 mg (03/24/2020), 96 mg (03/31/2020), 96 mg (04/07/2020), 96 mg (04/14/2020) fosaprepitant (EMEND) 150 mg in sodium chloride 0.9 % 145 mL IVPB, 150 mg,  Intravenous,  Once, 1 of 1 cycle Administration: 150 mg (03/24/2020), 150 mg (03/31/2020), 150 mg (04/07/2020), 150 mg (04/14/2020) trastuzumab-anns (KANJINTI) 378 mg in sodium chloride 0.9 % 250 mL chemo infusion, 4 mg/kg = 378 mg (100 % of original dose 4 mg/kg), Intravenous,  Once, 1 of 1 cycle Dose modification: 4 mg/kg (original dose 4 mg/kg, Cycle 1, Reason: Other (see comments), Comment: loading dose) Administration: 378 mg (03/11/2020), 189 mg (03/17/2020), 189 mg (03/24/2020), 189 mg (03/31/2020), 189 mg (04/07/2020), 189 mg (04/14/2020), 189 mg (04/21/2020), 189 mg (05/04/2020)   for chemotherapy treatment.     Malignant neoplasm of upper-inner quadrant of left breast in female, estrogen receptor positive ([place]  02/20/2020 Initial Diagnosis   PET scan on 02/07/20 following a diagnosis of vulvar cancer showed a 1.7cm left breast mass and mildly hypermetabolic left axillary lymph nodes. [place]ammogram and [place] 02[place]20 showed a 3.1cm left breast mass at the 11 o'clock position, palpable on exam, and a single abnormal left axillary lymph node with cortical thickening. Left breast biopsy on 02/20/20 showed invasive and in situ carcinoma in the breast and axilla, grade 3, HER-2 positive (3+), ER/PR+ 95%, Ki67 75%   02/25/2020 Cancer Staging   Staging form: Breast, AJCC 8th Edition - Clinical stage from 02/25/2020: Stage IB (cT2, cN1, cM0, G3, ER+, PR+, HER2+) - Signed by GNicholas Lose MD on 02/25/2020    05/29/2020 - 05/29/2020 Chemotherapy   The patient had palonosetron (ALOXI) injection 0.25 mg, 0.25 mg, Intravenous,  Once, 1 of 3 cycles Administration: 0.25 mg (05/29/2020) pegfilgrastim-jmdb (FULPHILA) injection 6 mg, 6 mg, Subcutaneous,  Once, 1 of 3 cycles CARBOplatin (PARAPLATIN) 580 mg in sodium chloride 0.9 % 250 mL chemo infusion, 580 mg (100 % of original dose 580 mg), Intravenous,  Once, 1 of 3 cycles Dose modification:   (original dose 580 mg, Cycle 1) Administration: 580 mg (05/29/2020) DOCEtaxel  (TAXOTERE) 120 mg in sodium chloride 0.9 % 250 mL chemo infusion, 60 mg/m2 = 120 mg (100 % of original dose 60 mg/m2), Intravenous,  Once, 1 of 3 cycles Dose modification: 60 mg/m2 (original dose 60 mg/m2, Cycle 1, Reason: Provider Judgment), 50 mg/m2 (original dose 60 mg/m2, Cycle 2, Reason: Dose not tolerated) Administration: 120 mg (05/29/2020) fosaprepitant (EMEND) 150 mg in sodium chloride 0.9 % 145 mL IVPB, 150 mg, Intravenous,  Once, 1 of 3 cycles Administration: 150 mg (05/29/2020) pertuzumab (PERJETA) 420 mg in sodium chloride 0.9 % 250 mL chemo infusion, 420 mg (50 % of original dose 840 mg), Intravenous, Once, 1 of 3 cycles Dose modification: 420 mg (original dose 840 mg, Cycle 1, Reason: Provider Judgment) Administration: 420 mg (05/29/2020) trastuzumab-dkst (OGIVRI) 525 mg in sodium chloride 0.9 % 250 mL chemo infusion, 6 mg/kg = 525 mg (75 % of original dose 8 mg/kg), Intravenous,  Once, 1 of 3 cycles Dose modification: 6 mg/kg (original dose 8 mg/kg, Cycle 1, Reason: Provider Judgment) Administration: 525 mg (05/29/2020)   for chemotherapy treatment.     06/22/2020 Genetic Testing   Positive genetic testing: Heterozygous pathogenic variant detected in BRIP1 gene at c.2010dup (p.Glu671*).  Variant of uncertain significance detected in POLD1 at c.532G>A (p.Gly178Arg).  No other pathogenic variants detected in [facility] Common Hereditary Cancers Panel.  The report date is June 22, 2020.   The Common Hereditary Cancers Panel offered by [facility] includes sequencing and/or deletion duplication testing of the following 48 genes: APC, ATM, AXIN2, BARD1, BMPR1A, BRCA1, BRCA2, BRIP1, CDH1, CDK4, CDKN2A (p14ARF), CDKN2A (p16INK4a), CHEK2, CTNNA1, DICER1, EPCAM (Deletion/duplication testing only), GREM1 (promoter region deletion/duplication testing only), KIT, MEN1, MLH1, MSH2, MSH3, MSH6, MUTYH, NBN, NF1, NHTL1, PALB2, PDGFRA, PMS2, POLD1, POLE, PTEN, RAD50, RAD51C, RAD51D, RNF43, SDHB, SDHC, SDHD,  SMAD4, SMARCA4. STK11, TP53, TSC1, TSC2, and VHL.  The following genes were evaluated for sequence changes only: SDHA and HOXB13 c.251G>A variant only.   07/27/2020 Surgery   Left mastectomy [name]): three foci of IDC, 3.7cm, 1.5cm, and 1.2cm, with intermediate to high grade DCIS, clear margins, with 1/2 left axillary lymph nodes positive for carcinoma.    08/18/2020 -  Chemotherapy    Patient is on Treatment Plan: BREAST ADO-TRASTUZUMAB EMTANSINE [facility]) Q21D       09/09/2020 - 10/23/2020 Radiation Therapy   Adjuvant radiation     CHIEF COMPLIANT: Kadcyla maintenance  INTERVAL HISTORY: Sandra Miller is a 45 y.o. with above-mentioned history of left breast cancer and vulvar cancer who completed neoadjuvant chemotherapy, underwent a left mastectomy, radiation, and is currently on Kadcyla maintenance. She also has a history of DVT currently on Xarelto. She presents to the clinic today for treatment.  She is complaining of diffuse body aches and pains.  She has seen orthopedic surgeon who recommended glucosamine and turmeric along with magnesium supplementation.  She is working full-time and that is causing her significant fatigue and body aches and pains.  ALLERGIES:  is allergic to dilaudid [hydromorphone hcl], depakote [divalproex sodium], minocycline, and aspirin.  MEDICATIONS:  Current Outpatient Medications  Medication Sig Dispense Refill   acetaminophen (TYLENOL) 500 MG tablet Take 2 tablets (1,000 mg total) by mouth every 6 (six) hours  as needed for moderate pain or fever. Avoid tylenol until your liver function back to normal 30 tablet 0   Cholecalciferol (D3-1000 PO) Take 1 tablet by mouth daily.     diclofenac Sodium (VOLTAREN) 1 % GEL Apply 4 grams topically 4  times daily as needed. 500 g 6   diphenoxylate-atropine (LOMOTIL) 2.5-0.025 MG tablet TAKE 1 TO 2 TABLETS BY MOUTH FOUR TIMES DAILY AS NEEDED FOR DIARRHEA (Patient taking differently: Take 1-2 tablets by mouth 4 (four)  times daily as needed for diarrhea or loose stools. TAKE 1 TO 2 TABLETS BY MOUTH FOUR TIMES DAILY AS NEEDED FOR DIARRHEA) 45 tablet 1   doxylamine, Sleep, (UNISOM) 25 MG tablet Take 25 mg by mouth as needed.     dronabinol (MARINOL) 2.5 MG capsule Take 1 capsule (2.5 mg total) by mouth 2 (two) times daily before a meal. 60 capsule 0   gabapentin (NEURONTIN) 300 MG capsule TAKE 1 CAPSULE BY MOUTH ONCE A DAY 30 capsule 5   letrozole (FEMARA) 2.5 MG tablet Take 1 tablet (2.5 mg total) by mouth daily. 90 tablet 3   lidocaine-prilocaine (EMLA) cream Apply to affected area once 30 g 3   lisinopril (ZESTRIL) 20 MG tablet TAKE 1/2 TABLET BY MOUTH ONCE A DAY 90 tablet 3   LORazepam (ATIVAN) 1 MG tablet Take 1 tablet by mouth 3 times daily as needed for anxiety. 90 tablet 2   magnesium oxide (MAG-OX) 400 (241.3 Mg) MG tablet Take 1 tablet (400 mg total) by mouth 2 (two) times daily. 60 tablet 1   Magnesium Oxide 400 (240 Mg) MG TABS TAKE 1 TABLET BY MOUTH ONCE DAILY 30 tablet 1   Multiple Vitamin (MULTIVITAMIN WITH MINERALS) TABS tablet Take 1 tablet by mouth daily.     nicotine (NICODERM CQ - DOSED IN MG/24 HR) 7 mg/24hr patch Place 1 patch (7 mg total) onto the skin daily. 28 patch 0   OLANZapine (ZYPREXA) 7.5 MG tablet Take 1 tablet (7.5 mg total) by mouth at bedtime. 30 tablet 2   omeprazole (PRILOSEC) 40 MG capsule TAKE 1 CAPSULE BY MOUTH EVERY MORNING AND EVERY NIGHT AT BEDTIME 60 capsule 3   promethazine (PHENERGAN) 25 MG tablet TAKE 1 TABLET BY MOUTH EVERY 6 HOURS AS NEEDED FOR NAUSEA 30 tablet 3   rivaroxaban (XARELTO) 20 MG TABS tablet TAKE 1 TABLET BY MOUTH DAILY WITH SUPPER. 30 tablet 6   rosuvastatin (CRESTOR) 10 MG tablet TAKE 1 TABLET BY MOUTH ONCE A DAY 90 tablet 3   senna-docusate (SENOKOT-S) 8.6-50 MG tablet Take 2 tablets by mouth at bedtime. For AFTER surgery only, do not take if having diarrhea 30 tablet 0   traMADol (ULTRAM) 50 MG tablet Take 1 tablet (50 mg total) by mouth every 6  (six) hours as needed for severe pain. For AFTER surgery only, do not take and drive 15 tablet 0   venlafaxine XR (EFFEXOR-XR) 37.5 MG 24 hr capsule TAKE 3 CAPSULES BY MOUTH ONCE A DAY 90 capsule 2   No current facility-administered medications for this visit.   Facility-Administered Medications Ordered in Other Visits  Medication Dose Route Frequency Provider Last Rate Last Admin   LORazepam (ATIVAN) injection 0.5 mg  0.5 mg Intravenous Once Harle Stanford., PA-C        PHYSICAL EXAMINATION: ECOG PERFORMANCE STATUS: 1 - Symptomatic but completely ambulatory  Vitals:   04/06/21 1139  BP: 128/79  Pulse: 88  Resp: 18  Temp: (!) 97.3 F (36.3 C)  SpO2: 98%   Filed Weights   04/06/21 1139  Weight: 191 lb 11.2 oz (87 kg)      LABORATORY DATA:  I have reviewed the data as listed CMP Latest Ref Rng & Units 03/16/2021 02/23/2021 02/02/2021  Glucose 70 - 99 mg/dL 90 96 104(H)  BUN 6 - 20 mg/dL _0 Creatinine 0.44 - 1.00 mg/dL 0.78 0.72 0.73  Sodium 135 - 145 mmol/L 140 138 139  Potassium 3.5 - 5.1 mmol/L 3.8 3.5 3.6  Chloride 98 - 111 mmol/L 102 104 102  CO2 22 - 32 mmol/L _1 Calcium 8.9 - 10.3 mg/dL 10.8(H) 9.9 10.3  Total Protein 6.5 - 8.1 g/dL 8.5(H) 7.9 7.7  Total Bilirubin 0.3 - 1.2 mg/dL 0.4 0.3 0.4  Alkaline Phos 38 - 126 U/L 78 62 62  AST 15 - 41 U/L _2 ALT 0 - 44 U/L _3 Lab Results  Component Value Date   WBC 8.7 04/06/2021   HGB 12.2 04/06/2021   HCT 36.3 04/06/2021   MCV 87.5 04/06/2021   PLT 315 04/06/2021   NEUTROABS 5.5 04/06/2021    ASSESSMENT & PLAN:  Malignant neoplasm of upper-inner quadrant of left breast in female, estrogen receptor positive ([place]) Breast cancer and vulvar cancers   02/20/2020:PET scan on 02/07/20 following a diagnosis of vulvar cancer showed a 1.7cm left breast mass and mildly hypermetabolic left axillary lymph nodes. Mammogram and [place] on 02/18/20 showed a 3.1cm left breast mass at the 11 o'clock position,  palpable on exam, and a single abnormal left axillary lymph node with cortical thickening. Left breast biopsy on 02/20/20 showed invasive and in situ carcinoma in the breast and axilla, grade 3, HER-2 positive (3+), ER/PR+ 95%, Ki67 75%   Treatment plan: 1.  Neoadjuvant chemoradiation for vulvar cancer with Taxol, carboplatin, Herceptin weekly x6 cycles 2. neoadjuvant chemotherapy with TCH Perjeta x3 cycles 3. 07/27/20: Left mastectomy [name]): three foci of IDC, 3.7cm, 1.5cm, and 1.2cm, with intermediate to high grade DCIS, clear margins, with 1/2 left axillary lymph nodes positive for carcinoma. 4. Followed by adjuvant radiation therapy to be completed on 10/23/20 5.  Kadcyla maintenance to be completed 04/06/2021 6.  Adjuvant antiestrogen therapy with anastrozole started 12/01/2020 switched to letrozole 02/23/2021 7.  Adjuvant neratinib ------------------------------------------------------------------------------------------------------------------- Current treatment: Today is cycle 12 Kadcyla, with anastrozole 1 mg daily started 12/01/2020 (menopausal) Kadcyla toxicities: Denies any adverse effects to Kadcyla.    Echocardiogram 11/18/2020: EF 60 to 65%   Letrozole toxicities: She is tolerating it extremely well without any problems. Patient expects to undergo bilateral salpingo-oophorectomy and hysterectomy in September 2022. We can see if one of the surgeons can remove her port at the same surgical procedure. RTC in 6 months for follow up    No orders of the defined types were placed in this encounter.  The patient has a good understanding of the overall plan. she agrees with it. she will call with any problems that may develop before the next visit here.  Total time spent: 30 mins including face to face time and time spent for planning, charting and coordination of care  Rulon Eisenmenger, MD, MPH 04/06/2021  I, Thana Ates, am acting as scribe for Dr. Nicholas Lose.  I have reviewed  the above documentation for accuracy and completeness, and I agree with the above.

## 2021-04-06 ENCOUNTER — Other Ambulatory Visit (HOSPITAL_COMMUNITY): Payer: Self-pay

## 2021-04-06 ENCOUNTER — Inpatient Hospital Stay: Payer: PRIVATE HEALTH INSURANCE | Attending: Gynecologic Oncology

## 2021-04-06 ENCOUNTER — Encounter: Payer: Self-pay | Admitting: *Deleted

## 2021-04-06 ENCOUNTER — Inpatient Hospital Stay (HOSPITAL_BASED_OUTPATIENT_CLINIC_OR_DEPARTMENT_OTHER): Payer: PRIVATE HEALTH INSURANCE | Admitting: Hematology and Oncology

## 2021-04-06 ENCOUNTER — Other Ambulatory Visit: Payer: Self-pay

## 2021-04-06 ENCOUNTER — Inpatient Hospital Stay: Payer: PRIVATE HEALTH INSURANCE

## 2021-04-06 DIAGNOSIS — C50212 Malignant neoplasm of upper-inner quadrant of left female breast: Secondary | ICD-10-CM

## 2021-04-06 DIAGNOSIS — Z5112 Encounter for antineoplastic immunotherapy: Secondary | ICD-10-CM | POA: Diagnosis present

## 2021-04-06 DIAGNOSIS — Z95828 Presence of other vascular implants and grafts: Secondary | ICD-10-CM

## 2021-04-06 DIAGNOSIS — Z170 Estrogen receptor positive status [ER+]: Secondary | ICD-10-CM

## 2021-04-06 DIAGNOSIS — Z79899 Other long term (current) drug therapy: Secondary | ICD-10-CM | POA: Diagnosis not present

## 2021-04-06 LAB — CMP (CANCER CENTER ONLY)
ALT: 27 U/L (ref 0–44)
AST: 32 U/L (ref 15–41)
Albumin: 4.2 g/dL (ref 3.5–5.0)
Alkaline Phosphatase: 65 U/L (ref 38–126)
Anion gap: 8 (ref 5–15)
BUN: 11 mg/dL (ref 6–20)
CO2: 28 mmol/L (ref 22–32)
Calcium: 9.9 mg/dL (ref 8.9–10.3)
Chloride: 102 mmol/L (ref 98–111)
Creatinine: 0.75 mg/dL (ref 0.44–1.00)
GFR, Estimated: >60 mL/min (ref >60)
Glucose, Bld: 127 mg/dL — ABNORMAL HIGH (ref 70–99)
Potassium: 3.4 mmol/L — ABNORMAL LOW (ref 3.5–5.1)
Sodium: 138 mmol/L (ref 135–145)
Total Bilirubin: 0.5 mg/dL (ref 0.3–1.2)
Total Protein: 7.9 g/dL (ref 6.5–8.1)

## 2021-04-06 LAB — CBC WITH DIFFERENTIAL (CANCER CENTER ONLY)
Abs Immature Granulocytes: 0.03 10*3/uL (ref 0.00–0.07)
Basophils Absolute: 0 10*3/uL (ref 0.0–0.1)
Basophils Relative: 0 %
Eosinophils Absolute: 0.2 10*3/uL (ref 0.0–0.5)
Eosinophils Relative: 2 %
HCT: 36.3 % (ref 36.0–46.0)
Hemoglobin: 12.2 g/dL (ref 12.0–15.0)
Immature Granulocytes: 0 %
Lymphocytes Relative: 28 %
Lymphs Abs: 2.4 10*3/uL (ref 0.7–4.0)
MCH: 29.4 pg (ref 26.0–34.0)
MCHC: 33.6 g/dL (ref 30.0–36.0)
MCV: 87.5 fL (ref 80.0–100.0)
Monocytes Absolute: 0.6 10*3/uL (ref 0.1–1.0)
Monocytes Relative: 7 %
Neutro Abs: 5.5 10*3/uL (ref 1.7–7.7)
Neutrophils Relative %: 63 %
Platelet Count: 315 10*3/uL (ref 150–400)
RBC: 4.15 MIL/uL (ref 3.87–5.11)
RDW: 14.6 % (ref 11.5–15.5)
WBC Count: 8.7 10*3/uL (ref 4.0–10.5)
nRBC: 0 % (ref 0.0–0.2)

## 2021-04-06 MED ORDER — HEPARIN SOD (PORK) LOCK FLUSH 100 UNIT/ML IV SOLN
500.0000 [IU] | Freq: Once | INTRAVENOUS | Status: AC | PRN
  Administered 2021-04-06: 500 [IU]
  Filled 2021-04-06: qty 5

## 2021-04-06 MED ORDER — SODIUM CHLORIDE 0.9 % IV SOLN
3.6000 mg/kg | Freq: Once | INTRAVENOUS | Status: AC
  Administered 2021-04-06: 300 mg via INTRAVENOUS
  Filled 2021-04-06: qty 15

## 2021-04-06 MED ORDER — SODIUM CHLORIDE 0.9 % IV SOLN
Freq: Once | INTRAVENOUS | Status: AC
  Filled 2021-04-06: qty 250

## 2021-04-06 MED ORDER — SODIUM CHLORIDE 0.9% FLUSH
10.0000 mL | INTRAVENOUS | Status: DC | PRN
  Administered 2021-04-06: 10 mL
  Filled 2021-04-06: qty 10

## 2021-04-06 MED ORDER — ACETAMINOPHEN 325 MG PO TABS
ORAL_TABLET | ORAL | Status: AC
  Filled 2021-04-06: qty 2

## 2021-04-06 MED ORDER — ACETAMINOPHEN 325 MG PO TABS
650.0000 mg | ORAL_TABLET | Freq: Once | ORAL | Status: AC
  Administered 2021-04-06: 650 mg via ORAL

## 2021-04-06 MED ORDER — SODIUM CHLORIDE 0.9% FLUSH
10.0000 mL | Freq: Once | INTRAVENOUS | Status: AC
  Administered 2021-04-06: 10 mL
  Filled 2021-04-06: qty 10

## 2021-04-06 MED ORDER — DIPHENHYDRAMINE HCL 25 MG PO CAPS
50.0000 mg | ORAL_CAPSULE | Freq: Once | ORAL | Status: AC
  Administered 2021-04-06: 50 mg via ORAL

## 2021-04-06 MED ORDER — NICOTINE 21 MG/24HR TD PT24
21.0000 mg | MEDICATED_PATCH | Freq: Every day | TRANSDERMAL | 0 refills | Status: DC
Start: 2021-04-06 — End: 2022-03-10
  Filled 2021-04-06: qty 28, 28d supply, fill #0

## 2021-04-06 MED ORDER — DIPHENHYDRAMINE HCL 25 MG PO CAPS
ORAL_CAPSULE | ORAL | Status: AC
  Filled 2021-04-06: qty 2

## 2021-04-06 NOTE — Patient Instructions (Signed)
[facility] MEDICAL ONCOLOGY  Discharge Instructions: °Thank you for choosing [facility] to provide your oncology and hematology care.  ° °If you have a lab appointment with the Cancer Center, please go directly to the Cancer Center and check in at the registration area. °  °Wear comfortable clothing and clothing appropriate for easy access to any Portacath or PICC line.  ° °We strive to give you quality time with your provider. You may need to reschedule your appointment if you arrive late (15 or more minutes).  Arriving late affects you and other patients whose appointments are after yours.  Also, if you miss three or more appointments without notifying the office, you may be dismissed from the clinic at the provider’s discretion.    °  °For prescription refill requests, have your pharmacy contact our office and allow 72 hours for refills to be completed.   ° °Today you received the following chemotherapy and/or immunotherapy agents kadcyla    °  °To help prevent nausea and vomiting after your treatment, we encourage you to take your nausea medication as directed. ° °BELOW ARE SYMPTOMS THAT SHOULD BE REPORTED IMMEDIATELY: °*FEVER GREATER THAN 100.4 F (38 °C) OR HIGHER °*CHILLS OR SWEATING °*NAUSEA AND VOMITING THAT IS NOT CONTROLLED WITH YOUR NAUSEA MEDICATION °*UNUSUAL SHORTNESS OF BREATH °*UNUSUAL BRUISING OR BLEEDING °*URINARY PROBLEMS (pain or burning when urinating, or frequent urination) °*BOWEL PROBLEMS (unusual diarrhea, constipation, pain near the anus) °TENDERNESS IN MOUTH AND THROAT WITH OR WITHOUT PRESENCE OF ULCERS (sore throat, sores in mouth, or a toothache) °UNUSUAL RASH, SWELLING OR PAIN  °UNUSUAL VAGINAL DISCHARGE OR ITCHING  ° °Items with * indicate a potential emergency and should be followed up as soon as possible or go to the Emergency Department if any problems should occur. ° °Please show the CHEMOTHERAPY ALERT CARD or IMMUNOTHERAPY ALERT CARD at check-in to the  Emergency Department and triage nurse. ° °Should you have questions after your visit or need to cancel or reschedule your appointment, please contact [facility]COL[facility]  Dept: [phone]  and follow the prompts.  Office hours are 8:00 a.m. to 4:30 p.m. Monday - Friday. Please note that voicemails left after 4:00 p.m. may not be returned until the following business day.  We are closed weekends and major holidays. You have access to a nurse at all times for urgent questions. Please call the main number to the clinic Dept: [phone] and follow the prompts. ° ° °For any non-urgent questions, you may also contact your provider using MyChart. We now offer e-Visits for anyone 18 and older to request care online for non-urgent symptoms. For details visit [email or web address]. °  °Also download the MyChart app! Go to the app store, search "MyChart", open the app, select [place], and log in with your MyChart username and password. ° °Due to Covid, a mask is required upon entering the hospital/clinic. If you do not have a mask, one will be given to you upon arrival. For doctor visits, patients may have 1 support person aged 18 or older with them. For treatment visits, patients cannot have anyone with them due to current Covid guidelines and our immunocompromised population.  ° °

## 2021-04-06 NOTE — Research (Signed)
Trial:  ACCRU-[id number] - TREATMENT OF ESTABLISHED CHEMOTHERAPY-INDUCED NEUROPATHY WITH N-PALMITOYLETHANOLAMIDE, A CANNABIMIMETIC NUTRACEUTICAL: A RANDOMIZED DOUBLE-BLIND PHASE II PILOT TRIAL  Patient Sandra Miller was identified by Dr. Lindi Adie as a potential candidate for the above listed study.  This Clinical Research Nurse met with Sandra Miller, [id number], on 04/06/21 in a manner and location that ensures patient privacy to discuss participation in the above listed research study.  Patient is Unaccompanied.  A copy of the informed consent document with embedded HIPAA language was provided to the patient.  Patient reads, speaks, and understands [place].   Patient was provided with the business card of this Nurse and encouraged to contact the research team with any questions.  Approximately 10 minutes were spent with the patient reviewing the informed consent documents.  Patient was provided the option of taking informed consent documents home to review and was encouraged to review at their convenience with their support network, including other care providers. Patient took the consent documents home to review. Briefly reviewed eligibility criteria with patient and since today is her last chemotherapy treatment, she would not be eligible to enroll on the study for at least 90 days.  Patient reports pain in her hands and feet but is not sure when it started and if it has been more than 90 days at this point. Also reviewed prohibited medications on study and patient states she thinks she would be willing to stop gabapentin if she participates in the study.  Tramadol on her list for upcoming surgery and patient states she does not take it otherwise. Asked patient if this nurse can follow up with her in 3 months to see if she is still having neuropathy symptoms and still interested in the study.  Patient agreed to this plan. Thanked patient for her time and willingness to consider this study.  Dr. Lindi Adie  notified.  Foye Spurling, BSN, RN Clinical Research Nurse 04/06/2021 1:47 PM

## 2021-04-07 ENCOUNTER — Other Ambulatory Visit (HOSPITAL_COMMUNITY): Payer: Self-pay

## 2021-04-07 ENCOUNTER — Telehealth: Payer: Self-pay | Admitting: Hematology and Oncology

## 2021-04-07 NOTE — Telephone Encounter (Signed)
Scheduled appointment per 08/02 los. Left message.

## 2021-04-08 ENCOUNTER — Other Ambulatory Visit (HOSPITAL_COMMUNITY): Payer: Self-pay

## 2021-04-08 MED FILL — Gabapentin Cap 300 MG: ORAL | 30 days supply | Qty: 30 | Fill #3 | Status: AC

## 2021-04-08 MED FILL — Omeprazole Cap Delayed Release 40 MG: ORAL | 30 days supply | Qty: 60 | Fill #2 | Status: AC

## 2021-04-08 MED FILL — Rivaroxaban Tab 20 MG: ORAL | 30 days supply | Qty: 30 | Fill #4 | Status: AC

## 2021-04-09 ENCOUNTER — Other Ambulatory Visit (HOSPITAL_COMMUNITY): Payer: Self-pay

## 2021-04-12 ENCOUNTER — Ambulatory Visit: Payer: Self-pay | Admitting: Surgery

## 2021-04-27 ENCOUNTER — Ambulatory Visit: Payer: PRIVATE HEALTH INSURANCE | Admitting: Hematology and Oncology

## 2021-04-27 ENCOUNTER — Other Ambulatory Visit: Payer: PRIVATE HEALTH INSURANCE

## 2021-04-27 ENCOUNTER — Ambulatory Visit: Payer: PRIVATE HEALTH INSURANCE

## 2021-04-27 ENCOUNTER — Encounter: Payer: Self-pay | Admitting: *Deleted

## 2021-04-27 DIAGNOSIS — Z170 Estrogen receptor positive status [ER+]: Secondary | ICD-10-CM

## 2021-04-27 DIAGNOSIS — C50212 Malignant neoplasm of upper-inner quadrant of left female breast: Secondary | ICD-10-CM

## 2021-04-27 NOTE — Progress Notes (Addendum)
..  Patient Assist/Replace for the following has been terminated. Medication: Kadcyla (ado-trastuzumab emtansine) Reason for Termination: Treatment completed 04/06/2021. Last DOS: 04/06/2021. Marland KitchenJuan Quam, CPhT IV Drug Replacement Specialist [place] Phone: [phone]

## 2021-04-30 ENCOUNTER — Telehealth: Payer: Self-pay

## 2021-04-30 NOTE — Telephone Encounter (Addendum)
LM for Sandra Miller to call the office next week to review when to hold and resume Xarellto and Femara around surgery.   Xarelto is held 5 days prior to surgery on 05-18-21. Last dose would be 05-12-21. She would resume Xarelto 2 days after surgery which would be 05-21-21.  Femara is to be held for 7 days prior to surgery and 7 days after surgery per Dr. Geralyn Flash guidelines. Last dose would be 05-10-21. Resume Femara on 05-26-21

## 2021-05-02 MED FILL — Rivaroxaban Tab 20 MG: ORAL | 30 days supply | Qty: 30 | Fill #5 | Status: AC

## 2021-05-03 ENCOUNTER — Other Ambulatory Visit (HOSPITAL_COMMUNITY): Payer: Self-pay

## 2021-05-04 ENCOUNTER — Other Ambulatory Visit (HOSPITAL_COMMUNITY): Payer: Self-pay

## 2021-05-05 ENCOUNTER — Encounter: Payer: Self-pay | Admitting: Hematology and Oncology

## 2021-05-05 NOTE — Telephone Encounter (Signed)
Received call from [place]. Provided her with instructions on when to hold and resume her Xarelto and Femara. See RN note on 04/30/21. Patient verbalized understanding, instructed to call with questions or concerns.

## 2021-05-05 NOTE — Progress Notes (Signed)
DUE TO COVID-19 ONLY ONE VISITOR IS ALLOWED TO COME WITH YOU AND STAY IN THE WAITING ROOM ONLY DURING PRE OP AND PROCEDURE DAY OF SURGERY. THE 1 VISITOR  MAY VISIT WITH YOU AFTER SURGERY IN YOUR PRIVATE ROOM DURING VISITING HOURS ONLY!  YOU NEED TO HAVE A COVID 19 TEST ON_______ '@_______'$ , THIS TEST MUST BE DONE BEFORE SURGERY,  COVID TESTING SITE IS AT [place]. PLEASE REMAIN IN YOUR CAR THIS IS A DRIVER UP TEST. AFTER YOUR COVID TEST PLEASE WEAR A MASK OUT IN PUBLIC AND SOCIAL DISTANCE AND [place] YOUR HANDS FREQUENTLY. PLEASE ASK ALL YOUR CLOSE CONTACTS TO WEAR A MASK OUT IN PUBLIC AND SOCIAL DISTANCE AND [place] HANDS FREQUENTLY ALSO.               Sandra Miller  05/05/2021   Your procedure is scheduled on:  05/18/2021    Report to [facility] Main  Entrance   Report to admitting at   [phone]     Call this number if you have problems the morning of surgery [phone]    Remember: Do not eat food , candy gum or mints :After Midnight. You may have clear liquids from midnight until 0430am __  Eat a light diet the day before surgery.  Avoid gas producing foods.   CLEAR LIQUID DIET   Foods Allowed                                                                       Coffee and tea, regular and decaf                              Plain Jell-O any favor except red or purple                                            Fruit ices (not with fruit pulp)                                      Iced Popsicles                                     Carbonated beverages, regular and diet                                    Cranberry, grape and apple juices Sports drinks like Gatorade Lightly seasoned clear broth or consume(fat free) Sugar   _____________________________________________________________________    BRUSH YOUR TEETH MORNING OF SURGERY AND RINSE YOUR MOUTH OUT, NO CHEWING GUM CANDY OR MINTS.     Take these medicines the morning of surgery with A SIP OF  WATER:  claritin, effexor, prilosec   DO NOT TAKE ANY DIABETIC MEDICATIONS DAY OF YOUR SURGERY  You may not have any metal on your body including hair pins and              piercings  Do not wear jewelry, make-up, lotions, powders or perfumes, deodorant             Do not wear nail polish on your fingernails.  Do not shave  48 hours prior to surgery.              Men may shave face and neck.   Do not bring valuables to the hospital. [place].  Contacts, dentures or bridgework may not be worn into surgery.  Leave suitcase in the car. After surgery it may be brought to your room.     Patients discharged the day of surgery will not be allowed to drive home. IF YOU ARE HAVING SURGERY AND GOING HOME THE SAME DAY, YOU MUST HAVE AN ADULT TO DRIVE YOU HOME AND BE WITH YOU FOR 24 HOURS. YOU MAY GO HOME BY TAXI OR UBER OR ORTHERWISE, BUT AN ADULT MUST ACCOMPANY YOU HOME AND STAY WITH YOU FOR 24 HOURS.  Name and phone number of your driver:  Special Instructions: N/A              Please read over the following fact sheets you were given: _____________________________________________________________________  [facility] - Preparing for Surgery Before surgery, you can play an important role.  Because skin is not sterile, your skin needs to be as free of germs as possible.  You can reduce the number of germs on your skin by washing with CHG (chlorahexidine gluconate) soap before surgery.  CHG is an antiseptic cleaner which kills germs and bonds with the skin to continue killing germs even after washing. Please DO NOT use if you have an allergy to CHG or antibacterial soaps.  If your skin becomes reddened/irritated stop using the CHG and inform your nurse when you arrive at Short Stay. Do not shave (including legs and underarms) for at least 48 hours prior to the first CHG shower.  You may shave your face/neck. Please follow  these instructions carefully:  1.  Shower with CHG Soap the night before surgery and the  morning of Surgery.  2.  If you choose to wash your hair, wash your hair first as usual with your  normal  shampoo.  3.  After you shampoo, rinse your hair and body thoroughly to remove the  shampoo.                           4.  Use CHG as you would any other liquid soap.  You can apply chg directly  to the skin and wash                       Gently with a scrungie or clean washcloth.  5.  Apply the CHG Soap to your body ONLY FROM THE NECK DOWN.   Do not use on face/ open                           Wound or open sores. Avoid contact with eyes, ears mouth and genitals (private parts).  Wash face,  Genitals (private parts) with your normal soap.             6.  Wash thoroughly, paying special attention to the area where your surgery  will be performed.  7.  Thoroughly rinse your body with warm water from the neck down.  8.  DO NOT shower/wash with your normal soap after using and rinsing off  the CHG Soap.                9.  Pat yourself dry with a clean towel.            10.  Wear clean pajamas.            11.  Place clean sheets on your bed the night of your first shower and do not  sleep with pets. Day of Surgery : Do not apply any lotions/deodorants the morning of surgery.  Please wear clean clothes to the hospital/surgery center.  FAILURE TO FOLLOW THESE INSTRUCTIONS MAY RESULT IN THE CANCELLATION OF YOUR SURGERY PATIENT SIGNATURE_________________________________  NURSE SIGNATURE__________________________________  ________________________________________________________________________

## 2021-05-05 NOTE — Progress Notes (Signed)
Anesthesia Review:  PCP: Cardiologist : Chest x-ray : EKG : Echo : 03/20/2021  Stress test: Cardiac Cath :  Activity level:  Sleep Study/ CPAP : Fasting Blood Sugar :      / Checks Blood Sugar -- times a day:   Blood Thinner/ Instructions /Last Dose: ASA / Instructions/ Last Dose :   Xarelto No covid test needed due to ambulatory surgery.

## 2021-05-06 ENCOUNTER — Encounter (HOSPITAL_COMMUNITY): Payer: Self-pay

## 2021-05-06 ENCOUNTER — Encounter (HOSPITAL_COMMUNITY)
Admission: RE | Admit: 2021-05-06 | Discharge: 2021-05-06 | Disposition: A | Discharge status: Home / Self Care | Payer: PRIVATE HEALTH INSURANCE | Source: Ambulatory Visit | Attending: Gynecologic Oncology | Admitting: Gynecologic Oncology

## 2021-05-06 ENCOUNTER — Other Ambulatory Visit: Payer: Self-pay

## 2021-05-06 DIAGNOSIS — Z01818 Encounter for other preprocedural examination: Secondary | ICD-10-CM | POA: Diagnosis present

## 2021-05-06 HISTORY — DX: Prediabetes: R73.03

## 2021-05-06 HISTORY — DX: Unspecified osteoarthritis, unspecified site: M19.90

## 2021-05-06 LAB — CBC
HCT: 36.6 % (ref 36.0–46.0)
Hemoglobin: 12 g/dL (ref 12.0–15.0)
MCH: 29.6 pg (ref 26.0–34.0)
MCHC: 32.8 g/dL (ref 30.0–36.0)
MCV: 90.1 fL (ref 80.0–100.0)
Platelets: 287 10*3/uL (ref 150–400)
RBC: 4.06 MIL/uL (ref 3.87–5.11)
RDW: 14.6 % (ref 11.5–15.5)
WBC: 8 10*3/uL (ref 4.0–10.5)
nRBC: 0 % (ref 0.0–0.2)

## 2021-05-06 LAB — COMPREHENSIVE METABOLIC PANEL
ALT: 20 U/L (ref 0–44)
AST: 28 U/L (ref 15–41)
Albumin: 4.2 g/dL (ref 3.5–5.0)
Alkaline Phosphatase: 69 U/L (ref 38–126)
Anion gap: 9 (ref 5–15)
BUN: 11 mg/dL (ref 6–20)
CO2: 26 mmol/L (ref 22–32)
Calcium: 9.4 mg/dL (ref 8.9–10.3)
Chloride: 101 mmol/L (ref 98–111)
Creatinine, Ser: 0.74 mg/dL (ref 0.44–1.00)
GFR, Estimated: >60 mL/min (ref >60)
Glucose, Bld: 98 mg/dL (ref 70–99)
Potassium: 3.5 mmol/L (ref 3.5–5.1)
Sodium: 136 mmol/L (ref 135–145)
Total Bilirubin: 0.4 mg/dL (ref 0.3–1.2)
Total Protein: 7.6 g/dL (ref 6.5–8.1)

## 2021-05-06 LAB — TYPE AND SCREEN
ABO/RH(D): O POS
Antibody Screen: NEGATIVE

## 2021-05-06 LAB — HEMOGLOBIN A1C
Hgb A1c MFr Bld: 6.1 % — ABNORMAL HIGH (ref 4.8–5.6)
Mean Plasma Glucose: 128.37 mg/dL

## 2021-05-06 NOTE — Progress Notes (Signed)
COVID Vaccine Completed: Yes Date COVID Vaccine completed: 01/12/21 COVID vaccine manufacturer: Pfizer    x 4  COVID Test: N/A  PCP - NO PCP Cardiologist - NO  Chest x-ray -  EKG -  Stress Test -  ECHO - 03/10/21: EPIC Cardiac Cath -  Pacemaker/ICD device last checked:  Sleep Study -  CPAP -   Fasting Blood Sugar -  Checks Blood Sugar _____ times a day  Blood Thinner Instructions: Xarelto will be on hold since: 05/13/21. As per Dr. Denman George instructions. Aspirin Instructions: Last Dose:  Anesthesia review: Hx: HTN,Smoker  Patient denies shortness of breath, fever, cough and chest pain at PAT appointment   Patient verbalized understanding of instructions that were given to them at the PAT appointment. Patient was also instructed that they will need to review over the PAT instructions again at home before surgery.

## 2021-05-07 ENCOUNTER — Ambulatory Visit: Payer: PRIVATE HEALTH INSURANCE | Admitting: Family Medicine

## 2021-05-07 DIAGNOSIS — E669 Obesity, unspecified: Secondary | ICD-10-CM | POA: Insufficient documentation

## 2021-05-07 DIAGNOSIS — F319 Bipolar disorder, unspecified: Secondary | ICD-10-CM | POA: Insufficient documentation

## 2021-05-09 MED FILL — Omeprazole Cap Delayed Release 40 MG: ORAL | 30 days supply | Qty: 60 | Fill #3 | Status: AC

## 2021-05-09 MED FILL — Gabapentin Cap 300 MG: ORAL | 30 days supply | Qty: 30 | Fill #4 | Status: AC

## 2021-05-09 MED FILL — Lisinopril Tab 20 MG: ORAL | 90 days supply | Qty: 45 | Fill #1 | Status: AC

## 2021-05-09 MED FILL — Rosuvastatin Calcium Tab 10 MG: ORAL | 90 days supply | Qty: 90 | Fill #1 | Status: AC

## 2021-05-11 ENCOUNTER — Other Ambulatory Visit (HOSPITAL_COMMUNITY): Payer: Self-pay

## 2021-05-17 ENCOUNTER — Telehealth: Payer: Self-pay | Admitting: *Deleted

## 2021-05-17 ENCOUNTER — Encounter: Payer: Self-pay | Admitting: Hematology and Oncology

## 2021-05-17 ENCOUNTER — Telehealth: Payer: Self-pay

## 2021-05-17 NOTE — Telephone Encounter (Signed)
Sandra Miller states that her last dose of Xarelto was 05-11-21. She will resume on 05-20-21. She stopped her Femara on 05-10-21 and will resume on 05-26-21. Pt states that she understands the pre-op instructions given on 05-06-21 by [name].

## 2021-05-17 NOTE — Telephone Encounter (Signed)
LM for Ms Leiper to call back to the office to review pre-op instructions and to make sure her Xarelto was stopped after 05-12-21 dose and can be resumed 2 days after surgery which would 05-20-21.

## 2021-05-17 NOTE — Anesthesia Preprocedure Evaluation (Addendum)
Anesthesia Evaluation  Patient identified by MRN, date of birth, ID band Patient awake    Reviewed: Allergy & Precautions, NPO status , Patient's Chart, lab work & pertinent test results  Airway Mallampati: II  TM Distance: >3 FB     Dental   Pulmonary Current Smoker,    breath sounds clear to auscultation       Cardiovascular hypertension,  Rhythm:Regular Rate:Normal     Neuro/Psych    GI/Hepatic hiatal hernia, GERD  ,  Endo/Other    Renal/GU Renal disease     Musculoskeletal   Abdominal   Peds  Hematology   Anesthesia Other Findings   Reproductive/Obstetrics                            Anesthesia Physical Anesthesia Plan  ASA: 3  Anesthesia Plan: General   Post-op Pain Management:    Induction: Intravenous  PONV Risk Score and Plan: 2 and Ondansetron, Dexamethasone and Midazolam  Airway Management Planned: Oral ETT  Additional Equipment:   Intra-op Plan:   Post-operative Plan: Possible Post-op intubation/ventilation  Informed Consent: I have reviewed the patients History and Physical, chart, labs and discussed the procedure including the risks, benefits and alternatives for the proposed anesthesia with the patient or authorized representative who has indicated his/her understanding and acceptance.     Dental advisory given  Plan Discussed with: Anesthesiologist  Anesthesia Plan Comments:        Anesthesia Quick Evaluation

## 2021-05-17 NOTE — Telephone Encounter (Signed)
Called and moved her appt from 9/28 to 9/26

## 2021-05-18 ENCOUNTER — Other Ambulatory Visit: Payer: PRIVATE HEALTH INSURANCE

## 2021-05-18 ENCOUNTER — Ambulatory Visit: Payer: PRIVATE HEALTH INSURANCE | Admitting: Hematology and Oncology

## 2021-05-18 ENCOUNTER — Ambulatory Visit (HOSPITAL_COMMUNITY)
Admission: RE | Admit: 2021-05-18 | Discharge: 2021-05-18 | Disposition: A | Discharge status: Home / Self Care | Payer: PRIVATE HEALTH INSURANCE | Source: Ambulatory Visit | Attending: Gynecologic Oncology | Admitting: Gynecologic Oncology

## 2021-05-18 ENCOUNTER — Encounter (HOSPITAL_COMMUNITY)
Admission: RE | Disposition: A | Discharge status: Home / Self Care | Payer: Self-pay | Source: Ambulatory Visit | Attending: Gynecologic Oncology

## 2021-05-18 ENCOUNTER — Ambulatory Visit: Payer: PRIVATE HEALTH INSURANCE

## 2021-05-18 ENCOUNTER — Encounter (HOSPITAL_COMMUNITY): Payer: Self-pay | Admitting: Gynecologic Oncology

## 2021-05-18 ENCOUNTER — Ambulatory Visit (HOSPITAL_COMMUNITY): Payer: PRIVATE HEALTH INSURANCE | Admitting: Certified Registered Nurse Anesthetist

## 2021-05-18 DIAGNOSIS — Z9221 Personal history of antineoplastic chemotherapy: Secondary | ICD-10-CM | POA: Diagnosis not present

## 2021-05-18 DIAGNOSIS — Z148 Genetic carrier of other disease: Secondary | ICD-10-CM

## 2021-05-18 DIAGNOSIS — F17210 Nicotine dependence, cigarettes, uncomplicated: Secondary | ICD-10-CM | POA: Diagnosis not present

## 2021-05-18 DIAGNOSIS — Z79899 Other long term (current) drug therapy: Secondary | ICD-10-CM | POA: Diagnosis not present

## 2021-05-18 DIAGNOSIS — Z9012 Acquired absence of left breast and nipple: Secondary | ICD-10-CM | POA: Insufficient documentation

## 2021-05-18 DIAGNOSIS — Z1502 Genetic susceptibility to malignant neoplasm of ovary: Secondary | ICD-10-CM | POA: Diagnosis not present

## 2021-05-18 DIAGNOSIS — Z885 Allergy status to narcotic agent status: Secondary | ICD-10-CM | POA: Insufficient documentation

## 2021-05-18 DIAGNOSIS — Z923 Personal history of irradiation: Secondary | ICD-10-CM | POA: Insufficient documentation

## 2021-05-18 DIAGNOSIS — Z86718 Personal history of other venous thrombosis and embolism: Secondary | ICD-10-CM | POA: Insufficient documentation

## 2021-05-18 DIAGNOSIS — Z1589 Genetic susceptibility to other disease: Secondary | ICD-10-CM

## 2021-05-18 DIAGNOSIS — Z8544 Personal history of malignant neoplasm of other female genital organs: Secondary | ICD-10-CM | POA: Diagnosis not present

## 2021-05-18 DIAGNOSIS — Z1509 Genetic susceptibility to other malignant neoplasm: Secondary | ICD-10-CM | POA: Insufficient documentation

## 2021-05-18 DIAGNOSIS — C519 Malignant neoplasm of vulva, unspecified: Secondary | ICD-10-CM | POA: Diagnosis present

## 2021-05-18 DIAGNOSIS — C50912 Malignant neoplasm of unspecified site of left female breast: Secondary | ICD-10-CM | POA: Diagnosis not present

## 2021-05-18 DIAGNOSIS — R87612 Low grade squamous intraepithelial lesion on cytologic smear of cervix (LGSIL): Secondary | ICD-10-CM | POA: Insufficient documentation

## 2021-05-18 DIAGNOSIS — Z1501 Genetic susceptibility to malignant neoplasm of breast: Secondary | ICD-10-CM

## 2021-05-18 DIAGNOSIS — Z7901 Long term (current) use of anticoagulants: Secondary | ICD-10-CM | POA: Insufficient documentation

## 2021-05-18 DIAGNOSIS — Z886 Allergy status to analgesic agent status: Secondary | ICD-10-CM | POA: Insufficient documentation

## 2021-05-18 HISTORY — PX: PORT-A-CATH REMOVAL: SHX5289

## 2021-05-18 HISTORY — PX: VULVA /PERINEUM BIOPSY: SHX319

## 2021-05-18 HISTORY — PX: ROBOTIC ASSISTED TOTAL HYSTERECTOMY WITH BILATERAL SALPINGO OOPHERECTOMY: SHX6086

## 2021-05-18 LAB — ABO/RH: ABO/RH(D): O POS

## 2021-05-18 LAB — PREGNANCY, URINE: Preg Test, Ur: NEGATIVE

## 2021-05-18 SURGERY — HYSTERECTOMY, TOTAL, ROBOT-ASSISTED, LAPAROSCOPIC, WITH BILATERAL SALPINGO-OOPHORECTOMY
Anesthesia: General | Laterality: Right

## 2021-05-18 MED ORDER — PROPOFOL 500 MG/50ML IV EMUL
INTRAVENOUS | Status: AC
  Filled 2021-05-18: qty 50

## 2021-05-18 MED ORDER — MIDAZOLAM HCL 2 MG/2ML IJ SOLN
INTRAMUSCULAR | Status: AC
  Filled 2021-05-18: qty 2

## 2021-05-18 MED ORDER — AMISULPRIDE (ANTIEMETIC) 5 MG/2ML IV SOLN
INTRAVENOUS | Status: AC
  Filled 2021-05-18: qty 2

## 2021-05-18 MED ORDER — FENTANYL CITRATE (PF) 100 MCG/2ML IJ SOLN
INTRAMUSCULAR | Status: AC
  Filled 2021-05-18: qty 2

## 2021-05-18 MED ORDER — TRAMADOL HCL 50 MG PO TABS
ORAL_TABLET | ORAL | Status: AC
  Filled 2021-05-18: qty 1

## 2021-05-18 MED ORDER — ONDANSETRON HCL 4 MG/2ML IJ SOLN
INTRAMUSCULAR | Status: DC | PRN
  Administered 2021-05-18: 4 mg via INTRAVENOUS

## 2021-05-18 MED ORDER — GLYCOPYRROLATE 0.2 MG/ML IJ SOLN
INTRAMUSCULAR | Status: AC
  Filled 2021-05-18: qty 1

## 2021-05-18 MED ORDER — FENTANYL CITRATE PF 50 MCG/ML IJ SOSY: 25.0000 ug | PREFILLED_SYRINGE | INTRAMUSCULAR | Status: DC | PRN

## 2021-05-18 MED ORDER — BUPIVACAINE HCL 0.25 % IJ SOLN
INTRAMUSCULAR | Status: DC | PRN
  Administered 2021-05-18: 19 mL

## 2021-05-18 MED ORDER — PROPOFOL 500 MG/50ML IV EMUL
INTRAVENOUS | Status: DC | PRN
  Administered 2021-05-18: 25 ug/kg/min via INTRAVENOUS

## 2021-05-18 MED ORDER — GABAPENTIN 300 MG PO CAPS
300.0000 mg | ORAL_CAPSULE | ORAL | Status: AC
  Administered 2021-05-18: 300 mg via ORAL
  Filled 2021-05-18: qty 1

## 2021-05-18 MED ORDER — ONDANSETRON HCL 4 MG/2ML IJ SOLN
INTRAMUSCULAR | Status: AC
  Filled 2021-05-18: qty 2

## 2021-05-18 MED ORDER — LIDOCAINE 2% (20 MG/ML) 5 ML SYRINGE
INTRAMUSCULAR | Status: DC | PRN
  Administered 2021-05-18: 80 mg via INTRAVENOUS

## 2021-05-18 MED ORDER — FENTANYL CITRATE (PF) 250 MCG/5ML IJ SOLN
INTRAMUSCULAR | Status: AC
  Filled 2021-05-18: qty 5

## 2021-05-18 MED ORDER — SODIUM CHLORIDE 0.9% FLUSH: 3.0000 mL | Freq: Two times a day (BID) | INTRAVENOUS | Status: DC

## 2021-05-18 MED ORDER — CEFAZOLIN SODIUM-DEXTROSE 2-4 GM/100ML-% IV SOLN
2.0000 g | INTRAVENOUS | Status: AC
  Administered 2021-05-18: 2 g via INTRAVENOUS
  Filled 2021-05-18: qty 100

## 2021-05-18 MED ORDER — BUPIVACAINE-EPINEPHRINE 0.25% -1:200000 IJ SOLN
INTRAMUSCULAR | Status: DC | PRN
  Administered 2021-05-18: 10 mL

## 2021-05-18 MED ORDER — ENOXAPARIN SODIUM 40 MG/0.4ML IJ SOSY
40.0000 mg | PREFILLED_SYRINGE | INTRAMUSCULAR | Status: AC
Start: 2021-05-18 — End: 2021-05-18
  Administered 2021-05-18: 40 mg via SUBCUTANEOUS
  Filled 2021-05-18: qty 0.4

## 2021-05-18 MED ORDER — BUPIVACAINE-EPINEPHRINE (PF) 0.25% -1:200000 IJ SOLN
INTRAMUSCULAR | Status: AC
  Filled 2021-05-18: qty 30

## 2021-05-18 MED ORDER — TRAMADOL HCL 50 MG PO TABS
50.0000 mg | ORAL_TABLET | Freq: Four times a day (QID) | ORAL | Status: DC | PRN
  Administered 2021-05-18: 50 mg via ORAL

## 2021-05-18 MED ORDER — STERILE WATER FOR IRRIGATION IR SOLN
Status: DC | PRN
  Administered 2021-05-18: 1000 mL

## 2021-05-18 MED ORDER — SCOPOLAMINE 1 MG/3DAYS TD PT72
1.0000 | MEDICATED_PATCH | TRANSDERMAL | Status: DC
  Administered 2021-05-18: 1.5 mg via TRANSDERMAL
  Filled 2021-05-18: qty 1

## 2021-05-18 MED ORDER — LACTATED RINGERS IR SOLN
Status: DC | PRN
  Administered 2021-05-18: 1000 mL

## 2021-05-18 MED ORDER — LACTATED RINGERS IV SOLN: INTRAVENOUS | Status: DC

## 2021-05-18 MED ORDER — LIDOCAINE 2% (20 MG/ML) 5 ML SYRINGE
INTRAMUSCULAR | Status: AC
  Filled 2021-05-18: qty 5

## 2021-05-18 MED ORDER — FENTANYL CITRATE (PF) 100 MCG/2ML IJ SOLN
INTRAMUSCULAR | Status: DC | PRN
  Administered 2021-05-18 (×2): 100 ug via INTRAVENOUS
  Administered 2021-05-18 (×3): 50 ug via INTRAVENOUS

## 2021-05-18 MED ORDER — MIDAZOLAM HCL 5 MG/5ML IJ SOLN
INTRAMUSCULAR | Status: DC | PRN
  Administered 2021-05-18: 2 mg via INTRAVENOUS

## 2021-05-18 MED ORDER — ROCURONIUM BROMIDE 10 MG/ML (PF) SYRINGE
PREFILLED_SYRINGE | INTRAVENOUS | Status: AC
  Filled 2021-05-18: qty 10

## 2021-05-18 MED ORDER — METOCLOPRAMIDE HCL 5 MG/ML IJ SOLN
5.0000 mg | Freq: Once | INTRAMUSCULAR | Status: AC
  Administered 2021-05-18: 5 mg via INTRAVENOUS

## 2021-05-18 MED ORDER — CHLORHEXIDINE GLUCONATE 0.12 % MT SOLN
15.0000 mL | Freq: Once | OROMUCOSAL | Status: AC
  Administered 2021-05-18: 15 mL via OROMUCOSAL

## 2021-05-18 MED ORDER — PROPOFOL 10 MG/ML IV BOLUS
INTRAVENOUS | Status: DC | PRN
  Administered 2021-05-18: 170 mg via INTRAVENOUS

## 2021-05-18 MED ORDER — ORAL CARE MOUTH RINSE: 15.0000 mL | Freq: Once | OROMUCOSAL | Status: AC

## 2021-05-18 MED ORDER — DEXAMETHASONE SODIUM PHOSPHATE 4 MG/ML IJ SOLN
4.0000 mg | INTRAMUSCULAR | Status: AC
  Administered 2021-05-18: 5 mg via INTRAVENOUS

## 2021-05-18 MED ORDER — DEXAMETHASONE SODIUM PHOSPHATE 10 MG/ML IJ SOLN
INTRAMUSCULAR | Status: AC
  Filled 2021-05-18: qty 1

## 2021-05-18 MED ORDER — BUPIVACAINE HCL 0.25 % IJ SOLN
INTRAMUSCULAR | Status: AC
  Filled 2021-05-18: qty 1

## 2021-05-18 MED ORDER — ROCURONIUM BROMIDE 10 MG/ML (PF) SYRINGE
PREFILLED_SYRINGE | INTRAVENOUS | Status: DC | PRN
  Administered 2021-05-18: 70 mg via INTRAVENOUS
  Administered 2021-05-18: 30 mg via INTRAVENOUS

## 2021-05-18 MED ORDER — SUGAMMADEX SODIUM 200 MG/2ML IV SOLN
INTRAVENOUS | Status: DC | PRN
  Administered 2021-05-18: 200 mg via INTRAVENOUS

## 2021-05-18 MED ORDER — ACETAMINOPHEN 500 MG PO TABS
1000.0000 mg | ORAL_TABLET | ORAL | Status: AC
  Administered 2021-05-18: 1000 mg via ORAL
  Filled 2021-05-18: qty 2

## 2021-05-18 MED ORDER — METOCLOPRAMIDE HCL 5 MG/ML IJ SOLN
INTRAMUSCULAR | Status: AC
  Filled 2021-05-18: qty 2

## 2021-05-18 MED ORDER — AMISULPRIDE (ANTIEMETIC) 5 MG/2ML IV SOLN
5.0000 mg | Freq: Once | INTRAVENOUS | Status: AC
  Administered 2021-05-18: 5 mg via INTRAVENOUS

## 2021-05-18 SURGICAL SUPPLY — 100 items
ADH SKN CLS APL DERMABOND .7 (GAUZE/BANDAGES/DRESSINGS) ×2
AGENT HMST KT MTR STRL THRMB (HEMOSTASIS)
APL ESCP 34 STRL LF DISP (HEMOSTASIS)
APL SKNCLS STERI-STRIP NONHPOA (GAUZE/BANDAGES/DRESSINGS) ×2
APPLICATOR SURGIFLO ENDO (HEMOSTASIS) IMPLANT
BAG COUNTER SPONGE SURGICOUNT (BAG) IMPLANT
BAG SPEC RTRVL LRG 6X4 10 (ENDOMECHANICALS)
BAG SPNG CNTER NS LX DISP (BAG)
BENZOIN TINCTURE PRP APPL 2/3 (GAUZE/BANDAGES/DRESSINGS) ×1 IMPLANT
BLADE SURG 15 STRL LF DISP TIS (BLADE) ×2 IMPLANT
BLADE SURG 15 STRL SS (BLADE) ×3
BLADE SURG SZ10 CARB STEEL (BLADE) IMPLANT
CELLS DAT CNTRL 66122 CELL SVR (MISCELLANEOUS) IMPLANT
CLSR STERI-STRIP ANTIMIC 1/2X4 (GAUZE/BANDAGES/DRESSINGS) ×1 IMPLANT
COVER BACK TABLE 60X90IN (DRAPES) ×3 IMPLANT
COVER SURGICAL LIGHT HANDLE (MISCELLANEOUS) ×3 IMPLANT
COVER TIP SHEARS 8 DVNC (MISCELLANEOUS) ×2 IMPLANT
COVER TIP SHEARS 8MM DA VINCI (MISCELLANEOUS) ×3
DECANTER SPIKE VIAL GLASS SM (MISCELLANEOUS) ×3 IMPLANT
DERMABOND ADVANCED (GAUZE/BANDAGES/DRESSINGS) ×1
DERMABOND ADVANCED .7 DNX12 (GAUZE/BANDAGES/DRESSINGS) ×2 IMPLANT
DRAPE ARM DVNC X/XI (DISPOSABLE) ×8 IMPLANT
DRAPE COLUMN DVNC XI (DISPOSABLE) ×2 IMPLANT
DRAPE DA VINCI XI ARM (DISPOSABLE) ×12
DRAPE DA VINCI XI COLUMN (DISPOSABLE) ×3
DRAPE LAPAROTOMY TRNSV 102X78 (DRAPES) ×3 IMPLANT
DRAPE SHEET LG 3/4 BI-LAMINATE (DRAPES) ×3 IMPLANT
DRAPE SURG IRRIG POUCH 19X23 (DRAPES) ×3 IMPLANT
DRAPE UTILITY XL STRL (DRAPES) ×3 IMPLANT
DRSG OPSITE POSTOP 4X6 (GAUZE/BANDAGES/DRESSINGS) IMPLANT
DRSG OPSITE POSTOP 4X8 (GAUZE/BANDAGES/DRESSINGS) IMPLANT
DRSG TEGADERM 4X4.75 (GAUZE/BANDAGES/DRESSINGS) ×1 IMPLANT
DRSG TELFA 3X8 NADH (GAUZE/BANDAGES/DRESSINGS) ×3 IMPLANT
ELECT PENCIL ROCKER SW 15FT (MISCELLANEOUS) IMPLANT
ELECT REM PT RETURN 15FT ADLT (MISCELLANEOUS) ×5 IMPLANT
GAUZE 4X4 16PLY ~~LOC~~+RFID DBL (SPONGE) ×6 IMPLANT
GAUZE SPONGE 4X4 12PLY STRL (GAUZE/BANDAGES/DRESSINGS) ×2 IMPLANT
GLOVE SURG ENC MOIS LTX SZ6 (GLOVE) ×12 IMPLANT
GLOVE SURG ENC MOIS LTX SZ6.5 (GLOVE) ×6 IMPLANT
GLOVE SURG ENC MOIS LTX SZ7 (GLOVE) ×2 IMPLANT
GLOVE SURG UNDER POLY LF SZ7 (GLOVE) ×3 IMPLANT
GLOVE SURG UNDER POLY LF SZ7.5 (GLOVE) ×3 IMPLANT
GOWN STRL REUS W/ TWL LRG LVL3 (GOWN DISPOSABLE) ×8 IMPLANT
GOWN STRL REUS W/TWL LRG LVL3 (GOWN DISPOSABLE) ×18 IMPLANT
GOWN STRL REUS W/TWL XL LVL3 (GOWN DISPOSABLE) ×3 IMPLANT
HOLDER FOLEY CATH W/STRAP (MISCELLANEOUS) IMPLANT
IRRIG SUCT STRYKERFLOW 2 WTIP (MISCELLANEOUS) ×3
IRRIGATION SUCT STRKRFLW 2 WTP (MISCELLANEOUS) ×2 IMPLANT
KIT BASIN OR (CUSTOM PROCEDURE TRAY) ×3 IMPLANT
KIT TURNOVER KIT A (KITS) ×6 IMPLANT
MANIPULATOR ADVINCU DEL 3.5 PL (MISCELLANEOUS) ×1 IMPLANT
MANIPULATOR UTERINE 4.5 ZUMI (MISCELLANEOUS) ×2 IMPLANT
NDL HYPO 25X1 1.5 SAFETY (NEEDLE) ×2 IMPLANT
NEEDLE HYPO 22GX1.5 SAFETY (NEEDLE) ×3 IMPLANT
NEEDLE HYPO 25X1 1.5 SAFETY (NEEDLE) ×3 IMPLANT
OBTURATOR OPTICAL STANDARD 8MM (TROCAR) ×3
OBTURATOR OPTICAL STND 8 DVNC (TROCAR) ×2
OBTURATOR OPTICALSTD 8 DVNC (TROCAR) ×2 IMPLANT
PACK BASIC VI WITH GOWN DISP (CUSTOM PROCEDURE TRAY) ×3 IMPLANT
PACK ROBOT GYN CUSTOM WL (TRAY / TRAY PROCEDURE) ×3 IMPLANT
PAD DRESSING TELFA 3X8 NADH (GAUZE/BANDAGES/DRESSINGS) IMPLANT
PAD POSITIONING PINK XL (MISCELLANEOUS) ×3 IMPLANT
PENCIL SMOKE EVACUATOR (MISCELLANEOUS) IMPLANT
PORT ACCESS TROCAR AIRSEAL 12 (TROCAR) ×2 IMPLANT
PORT ACCESS TROCAR AIRSEAL 5M (TROCAR) ×1
POUCH SPECIMEN RETRIEVAL 10MM (ENDOMECHANICALS) IMPLANT
RETRACTOR WND ALEXIS 18 MED (MISCELLANEOUS) IMPLANT
RETRACTOR WND ALEXIS 25 LRG (MISCELLANEOUS) IMPLANT
RTRCTR WOUND ALEXIS 18CM MED (MISCELLANEOUS)
RTRCTR WOUND ALEXIS 25CM LRG (MISCELLANEOUS)
SCRUB EXIDINE 4% CHG 4OZ (MISCELLANEOUS) ×3 IMPLANT
SEAL CANN UNIV 5-8 DVNC XI (MISCELLANEOUS) ×6 IMPLANT
SEAL XI 5MM-8MM UNIVERSAL (MISCELLANEOUS) ×12
SET TRI-LUMEN FLTR TB AIRSEAL (TUBING) ×3 IMPLANT
SPONGE GAUZE 2X2 8PLY STRL LF (GAUZE/BANDAGES/DRESSINGS) ×1 IMPLANT
SPONGE T-LAP 18X18 ~~LOC~~+RFID (SPONGE) IMPLANT
STRIP CLOSURE SKIN 1/2X4 (GAUZE/BANDAGES/DRESSINGS) ×3 IMPLANT
SURGIFLO W/THROMBIN 8M KIT (HEMOSTASIS) IMPLANT
SUT MNCRL AB 4-0 PS2 18 (SUTURE) ×3 IMPLANT
SUT PDS AB 1 TP1 96 (SUTURE) IMPLANT
SUT VIC AB 0 CT1 27 (SUTURE) ×3
SUT VIC AB 0 CT1 27XBRD ANTBC (SUTURE) ×2 IMPLANT
SUT VIC AB 2-0 CT1 27 (SUTURE)
SUT VIC AB 2-0 CT1 TAPERPNT 27 (SUTURE) IMPLANT
SUT VIC AB 3-0 SH 27 (SUTURE) ×3
SUT VIC AB 3-0 SH 27X BRD (SUTURE) IMPLANT
SUT VIC AB 4-0 PS2 18 (SUTURE) ×6 IMPLANT
SUT VLOC 180 0 9IN  GS21 (SUTURE) ×3
SUT VLOC 180 0 9IN GS21 (SUTURE) ×2 IMPLANT
SYR 20ML LL LF (SYRINGE) IMPLANT
SYR 50ML LL SCALE MARK (SYRINGE) IMPLANT
SYR CONTROL 10ML LL (SYRINGE) ×3 IMPLANT
TOWEL OR 17X26 10 PK STRL BLUE (TOWEL DISPOSABLE) ×3 IMPLANT
TOWEL OR NON WOVEN STRL DISP B (DISPOSABLE) ×3 IMPLANT
TRAP SPECIMEN MUCUS 40CC (MISCELLANEOUS) ×1 IMPLANT
TRAY FOLEY MTR SLVR 16FR STAT (SET/KITS/TRAYS/PACK) ×3 IMPLANT
TROCAR XCEL NON-BLD 5MMX100MML (ENDOMECHANICALS) ×1 IMPLANT
UNDERPAD 30X36 HEAVY ABSORB (UNDERPADS AND DIAPERS) ×4 IMPLANT
WATER STERILE IRR 1000ML POUR (IV SOLUTION) ×6 IMPLANT
YANKAUER SUCT BULB TIP 10FT TU (MISCELLANEOUS) IMPLANT

## 2021-05-18 NOTE — Op Note (Signed)
OPERATIVE NOTE 05/18/21  Surgeon: Donaciano Eva   Assistants: Dr Lahoma Crocker (an MD assistant was necessary for tissue manipulation, management of robotic instrumentation, retraction and positioning due to the complexity of the case and hospital policies).   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   Post-operative Diagnosis:  same, complete clinical response to vulvar radiation  Operation: Robotic-assisted laparoscopic total hysterectomy with bilateral salpingoophorectomy, vulvar biopsies  Surgeon: Donaciano Eva  Assistant Surgeon: Lahoma Crocker MD  Anesthesia: GET  Urine Output: 250cc  Operative Findings:  : no visible/macroscopic residual vulvar cancer. Fibroid uterus, normal appearing ovaries and tubes.  Estimated Blood Loss:   20cc       Total IV Fluids: 800 ml         Specimens:  uterus with cervix and bilateral fallopian tubes and ovaries.   [place]. Perineal body.          Complications:  None; patient tolerated the procedure well.         Disposition: PACU - hemodynamically stable.  Procedure Details  The patient was seen in the Holding Room. The risks, benefits, complications, treatment options, and expected outcomes were discussed with the patient.  The patient concurred with the proposed plan, giving informed consent.  The site of surgery properly noted/marked. The patient was identified as Transport planner and the procedure verified as a Robotic-assisted hysterectomy with bilateral salpingo oophorectomy and vulvar biopsies. A Time Out was held and the above information confirmed.  After induction of anesthesia, the patient was draped and prepped in the usual sterile manner. Pt was placed in supine position after anesthesia and draped and prepped in the usual sterile manner. The  abdominal drape was placed after the CholoraPrep had been allowed to dry for 3 minutes.  Her arms were tucked to her side with all appropriate precautions.  The shoulders were stabilized with padded shoulder blocks applied to the acromium processes.  The patient was placed in the semi-lithotomy position in [place].  The perineum was prepped with Betadine.   The patient's abdomen was then prepped.   A foley catheter was placed.  The perineal body (site of prior malignancy) was inspected and a biopsy was taken with a tischler forcep from the perineal body where there was residual scar but no visible or palpable tumor.   A sterile speculum was placed in the vagina.  The cervix was grasped with a single-tooth tenaculum and dilated with Kennon Rounds dilators.  The ZUMI uterine manipulator with a medium colpotomizer ring was placed without difficulty.  A pneum occluder balloon was placed over the manipulator.  OG tube placement was confirmed and to suction.   Next, a 5 mm skin incision was made 1 cm below the subcostal margin in the midclavicular line.  The 5 mm Optiview port and scope was used for direct entry.  Opening pressure was under 10 mm CO2.  The abdomen was insufflated and the findings were noted as above.   At this point and all points during the procedure, the patient's intra-abdominal pressure did not exceed 15 mmHg. Next, a 10 mm skin incision was made in the umbilicus and a right and left port was placed about 10 cm lateral to the robot port on the right and left side.  All ports were placed under direct visualization.  The patient was placed in steep  Trendelenburg.  Bowel was folded away into the upper abdomen.  The robot was docked in the normal manner.  The hysterectomy was started after the round ligament on the right side was incised and the retroperitoneum was entered and the pararectal space was developed.  The ureter was noted to be on the medial leaf of the broad ligament.  The peritoneum  above the ureter was incised and stretched and the infundibulopelvic ligament was skeletonized, cauterized and cut.  The posterior peritoneum was taken down to the level of the KOH ring.  The anterior peritoneum was also taken down.  The bladder flap was created to the level of the KOH ring.  The uterine artery on the right side was skeletonized, cauterized and cut in the normal manner.  A similar procedure was performed on the left.  The colpotomy was made and the uterus, cervix, bilateral ovaries and tubes were amputated and delivered through the vagina.  Pedicles were inspected and excellent hemostasis was achieved.    The colpotomy at the vaginal cuff was closed with Vicryl on a CT1 needle in figure of 8's at the corners and a v-loc in a running 2 layer manner.  Irrigation was used and excellent hemostasis was achieved.  At this point in the procedure was completed.  Robotic instruments were removed under direct visulaization.  The robot was undocked. The 10 mm ports were closed with Vicryl on a UR-5 needle and the fascia was closed with 0 Vicryl on a UR-5 needle.  The skin was closed with 4-0 Vicryl in a subcuticular manner.  Dermabond was applied.  Sponge, lap and needle counts correct x 2.  The patient was taken to the recovery room in stable condition.  The vagina was swabbed with  minimal bleeding noted.   All instrument and needle counts were correct x  3.   The patient was transferred to the recovery room in a stable condition.  Donaciano Eva, MD

## 2021-05-18 NOTE — Discharge Instructions (Addendum)
Return to work: 4 weeks (2 weeks with physical restrictions).  Activity: 1. Be up and out of the bed during the day.  Take a nap if needed.  You may walk up steps but be careful and use the hand rail.  Stair climbing will tire you more than you think, you may need to stop part way and rest.   2. No lifting or straining for 4 weeks.  3. No driving for 1 weeks.  Do Not drive if you are taking narcotic pain medicine.  4. Shower daily.  Use soap and water on your incision and pat dry; don't rub.   5. No sexual activity and nothing in the vagina for 8 weeks.  Medications:  - Take ibuprofen and tylenol first line for pain control. Take these regularly (every 6 hours) to decrease the build up of pain.  - If necessary, for severe pain not relieved by ibuprofen, contact Dr Serita Grit office and you will be prescribed percocet.  - While taking percocet you should take sennakot every night to reduce the likelihood of constipation. If this causes diarrhea, stop its use.  Diet: 1. Low sodium Heart Healthy Diet is recommended.  2. It is safe to use a laxative if you have difficulty moving your bowels.   Wound Care: 1. Keep clean and dry.  Shower daily.  Reasons to call the Doctor:  Fever - Oral temperature greater than 100.4 degrees Fahrenheit Foul-smelling vaginal discharge Difficulty urinating Nausea and vomiting Increased pain at the site of the incision that is unrelieved with pain medicine. Difficulty breathing with or without chest pain New calf pain especially if only on one side Sudden, continuing increased vaginal bleeding with or without clots.   Follow-up: 1. See Sandra Miller in 4 weeks.  Contacts: For questions or concerns you should contact:  Dr. Everitt Miller at [phone] After hours and on week-ends call [phone] and ask to speak to the physician on call for Gynecologic Oncology     For the port site - remove the dressing on Thursday, then you may shower over the  steri-strips.  These strips will come off on their own in the next week or two.  You may place an ice pack over the dressing when you get home today.

## 2021-05-18 NOTE — H&P (Signed)
  45 year old female - former patient of Dr. [name].  S/p port/ neoadjuvant chemo/ mastectomy.  Now completed chemotherapy.  She is undergoing hysterectomy/ oophorectomy by Dr. Denman George.  I am asked to remove the port.  Port site c/d/I.  Plan;  Port removal.  The surgical procedure has been discussed with the patient.  Potential risks, benefits, alternative treatments, and expected outcomes have been explained.  All of the patient's questions at this time have been answered.  The likelihood of reaching the patient's treatment goal is good.  The patient understand the proposed surgical procedure and wishes to proceed.  Sandra Miller. Georgette Dover, MD, [facility] Surgery  General Surgery   05/18/2021 7:05 AM

## 2021-05-18 NOTE — Anesthesia Procedure Notes (Signed)
Procedure Name: Intubation Date/Time: 05/18/2021 7:37 AM Performed by: Claudia Desanctis, CRNA Pre-anesthesia Checklist: Patient identified, Emergency Drugs available, Suction available and Patient being monitored Patient Re-evaluated:Patient Re-evaluated prior to induction Oxygen Delivery Method: Circle system utilized Preoxygenation: Pre-oxygenation with 100% oxygen Induction Type: IV induction Ventilation: Mask ventilation without difficulty Laryngoscope Size: 2 and Miller Grade View: Grade I Tube type: Oral Tube size: 7.0 mm Number of attempts: 1 Airway Equipment and Method: Stylet Placement Confirmation: ETT inserted through vocal cords under direct vision, positive ETCO2 and breath sounds checked- equal and bilateral Secured at: 22 cm Tube secured with: Tape Dental Injury: Teeth and Oropharynx as per pre-operative assessment

## 2021-05-18 NOTE — Op Note (Signed)
Preop diagnosis: Breast cancer status post neoadjuvant chemotherapy and mastectomy.  Postop diagnosis: Same Procedure performed: Port removal Surgeon:Dezerae Freiberger K Mikael Skoda Anesthesia: General Indications: This is a 45 year old female who is a former patient of Dr. [name].  She is status post port placement, neoadjuvant chemotherapy, and mastectomy in 2021.  She has now completed chemotherapy.  She is undergoing hysterectomy and oophorectomy by Dr. Denman George.  I am asked to remove her port while she is under anesthesia.  Description of procedure: The patient was already supine on the operating table.  We prepped the area over the port in her right chest with ChloraPrep and draped in sterile fashion.  A timeout was taken.  We infiltrated with local anesthetic.  I used her old scar and made an incision.  We dissected down through the subcutaneous tissue to the port.  We grasped the catheter and removed it from the right internal jugular vein.  Direct pressure was held on the side of the neck.  Once we were happy with hemostasis I remove the rest of the port.  We excised part of the capsule.  I inspected the wound for hemostasis.  We closed the wound with 3-0 Vicryl 4-0 Monocryl.  Benzoin and Steri-Strips were applied.  A clean dressing was applied.  The patient was then extubated and brought to the recovery room in stable condition.  All sponge, instrument, and needle counts are correct.  Imogene Burn. Georgette Dover, MD, [facility] Surgery  General Surgery   05/18/2021 9:16 AM

## 2021-05-18 NOTE — Transfer of Care (Signed)
Immediate Anesthesia Transfer of Care Note  Patient: Sandra Miller  Procedure(s) Performed: XI ROBOTIC ASSISTED TOTAL HYSTERECTOMY WITH BILATERAL SALPINGO OOPHORECTOMY VULVAR BIOPSY REMOVAL PORT-A-CATH (Right)  Patient Location: PACU  Anesthesia Type:General  Level of Consciousness: awake and patient cooperative  Airway & Oxygen Therapy: Patient Spontanous Breathing and Patient connected to face mask  Post-op Assessment: Report given to RN and Post -op Vital signs reviewed and stable  Post vital signs: Reviewed and stable  Last Vitals:  Vitals Value Taken Time  BP 131/103 05/18/21 0923  Temp    Pulse 54 05/18/21 0924  Resp 18 05/18/21 0924  SpO2 96 % 05/18/21 0924  Vitals shown include unvalidated device data.  Last Pain:  Vitals:   05/18/21 0626  TempSrc:   PainSc: 0-No pain      Patients Stated Pain Goal: 4 ([id number])  Complications: No notable events documented.

## 2021-05-18 NOTE — H&P (Signed)
H&P Note: Gyn-Onc  Consult was requested by Dr. Lindi Adie for the evaluation of Sandra Miller 45 y.o. 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 Hx of vulvar cancer treated with definitive radiation.    Assessment/Plan:  Sandra. KETTY BITTON  is a 45 y.o.  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 S/p primary radiation completed 05/08/20 with external beam to the pelvis (45 Gy) and boost to the vulva (16.2 Gy). No evidence of recurrent disease on exam. Recommend vulva inspection at 3 monthly intervals until September, 2023. I will take representative vulvar biopsies in the operating room to confirm complete pathologic response.  2/ Deleterious mutation in BRIP 1 conferring increased hereditary risk of ovarian cancer. I am recommending risk reducing bilateral salpingo-oophorectomy.  She strongly desires hysterectomy despite knowing that this particular gene mutation is not associated with the known increased risk for uterine cancer, and the patient has received low pelvic radiation which places her at increased risk for surgical complications associated with hysterectomy, particularly urologic injury or nonhealing vaginal cuff. Based on my exam and her US findings I believe this is reasonable to accomplish.  I counseled her regarding risk for the above-stated surgical complications in addition to  bleeding, infection, damage to internal organs (such as bladder,ureters, bowels), blood clot, reoperation and rehospitalization.  We will schedule this for early September when she is 1 month post completion of chemotherapy. She is scheduled for a robotic assisted total hysterectomy and BSO.  She has a history of left jugular thrombus and is on Xarelto.  We have held her Xarelto  for 5 days preoperatively and will for 2 days postoperatively unless there are bleeding complications.  Explained to the patient that she is at increased risk of VTE perioperatively while off Xarelto and also at increased risk for bleeding complications due to its recent use.  HPI: Sandra Miller is a 45 year old parous woman with a history of vulvar cancer who was seen in consultation at the request of Dr Lindi Adie for evaluation of a deleterious mutation in [place].  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  At the time of seeking consultation for sentinel lymph node biopsy with Dr. Thurston Pounds at [facility], it was determined that she was not a good candidate for primary radical vulvectomy due to the close proximity of the lesion to her anal sphincter.  Therefore she was treated with definitive external beam radiation to the lower pelvis and vulvar tissues.  Radiation dosing was between 03/16/20 through 05/08/20. It consisted of IMRT to the pelvis (45 Gy) and IMRT boost to the vulva (16.2 Gy).  Simultaneous to her diagnosis of vulvar cancer was a diagnosis of ER/PR positive breast cancer on the left.  This was stage II with positive lymph node.  She underwent treatment with Adriamycin and Taxotere followed by left mastectomy, trastuzumab infusion for HER-2 positive lesion, and was planned for chest wall radiation beginning in the new year of 2022.  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  Screening ultrasound of the pelvis on 11/06/2020 revealed a uterus measuring 9.1 x 3.3 x 4.3 cm with an endometrial thickness of 6 mm and bilateral ovaries which were normal in size and appearance.  Interval Hx:  She received Kadcyla adjuvant therapy which was completed in early  August, 2022.  She was amenorrheic since completing radiation to the pelvis. She has no symptoms concerning for vulvar recurrence.  Current Meds:  Outpatient Encounter Medications as of 02/19/2021  Medication Sig   senna-docusate (SENOKOT-S) 8.6-50 MG tablet Take 2 tablets by mouth at bedtime. For AFTER surgery only, do not take if having diarrhea   traMADol (ULTRAM) 50 MG tablet Take 1 tablet (50 mg total) by mouth every 6 (six) hours as needed for severe pain. For AFTER surgery only, do not take and drive   acetaminophen (TYLENOL) 500 MG tablet Take 2 tablets (1,000 mg total) by mouth every 6 (six) hours as needed for moderate pain or fever. Avoid tylenol until your liver function back to normal   anastrozole (ARIMIDEX) 1 MG tablet TAKE 1 TABLET BY MOUTH DAILY.   Cholecalciferol (D3-1000 PO) Take 1 tablet by mouth daily.   diphenoxylate-atropine (LOMOTIL) 2.5-0.025 MG tablet TAKE 1 TO 2 TABLETS BY MOUTH FOUR TIMES DAILY AS NEEDED FOR DIARRHEA (Patient taking differently: Take 1-2 tablets by mouth 4 (four) times daily as needed for diarrhea or loose stools. TAKE 1 TO 2 TABLETS BY MOUTH FOUR TIMES DAILY AS NEEDED FOR DIARRHEA)   doxylamine, Sleep, (UNISOM) 25 MG tablet Take 25 mg by mouth as needed.   dronabinol (MARINOL) 2.5 MG capsule Take 1 capsule (2.5 mg total) by mouth 2 (two) times daily before a meal.   gabapentin (NEURONTIN) 300 MG capsule TAKE 1 CAPSULE BY MOUTH ONCE A DAY   lidocaine-prilocaine (EMLA) cream Apply to affected area once   lisinopril (ZESTRIL) 20 MG tablet TAKE 1/2 TABLET BY MOUTH ONCE A DAY   LORazepam (ATIVAN) 1 MG tablet Take 1 tablet (1 mg total) by mouth 2 (two) times daily as needed for anxiety.   magnesium oxide (MAG-OX) 400 (241.3 Mg) MG tablet Take 1 tablet (400 mg total) by mouth 2 (two) times daily.   Magnesium Oxide 400 (240 Mg) MG TABS TAKE 1 TABLET BY MOUTH ONCE DAILY   Multiple Vitamin (MULTIVITAMIN WITH MINERALS) TABS tablet Take 1 tablet by mouth daily.    nicotine (NICODERM CQ - DOSED IN MG/24 HOURS) 14 mg/24hr patch PLACE 1 PATCH ONTO THE SKIN DAILY   OLANZapine (ZYPREXA) 10 MG tablet TAKE 1 TABLET BY MOUTH AT BEDTIME   omeprazole (PRILOSEC) 40 MG capsule TAKE 1 CAPSULE BY MOUTH EVERY MORNING AND EVERY NIGHT AT BEDTIME   promethazine (PHENERGAN) 25 MG tablet TAKE 1 TABLET BY MOUTH EVERY 6 HOURS AS NEEDED FOR NAUSEA   rivaroxaban (XARELTO) 20 MG TABS tablet TAKE 1 TABLET BY MOUTH DAILY WITH SUPPER.   rosuvastatin (CRESTOR) 10 MG tablet TAKE 1 TABLET BY MOUTH ONCE A DAY   venlafaxine XR (EFFEXOR-XR) 37.5 MG 24 hr capsule TAKE 3 CAPSULES BY MOUTH ONCE A DAY   [DISCONTINUED] prochlorperazine (COMPAZINE) 10 MG tablet TAKE 1 TABLET BY MOUTH EVERY 6 HOURS AS NEEDED FOR NAUSEA OR VOMITING (Patient taking differently: Take 10 mg by mouth every 6 (six) hours as needed for nausea or vomiting. )   Facility-Administered Encounter Medications as of 02/19/2021  Medication   LORazepam (ATIVAN) injection 0.5 mg    Allergy:  Allergies  Allergen Reactions   Dilaudid [Hydromorphone Hcl] Other (  See Comments)    Pt became confused, pulled out iv's, does not remember anything   Depakote [Divalproex Sodium] Nausea And Vomiting   Minocycline Hives   Aspirin Hives    States able to tolerate Goody Powders without any problem except for GI upset    Social Hx:   Social History   Socioeconomic History   Marital status: Single    Spouse name: Not on file   Number of children: Not on file   Years of education: Not on file   Highest education level: Not on file  Occupational History   Not on file  Tobacco Use   Smoking status: Every Day    Packs/day: 0.50    Years: 22.00    Pack years: 11.00    Types: Cigarettes   Smokeless tobacco: Never   Tobacco comments:    Recently started a smoking cessation class.   Vaping Use   Vaping Use: Never used  Substance and Sexual Activity   Alcohol use: Not Currently    Alcohol/week: 0.0 standard drinks     Comment:  hx alcohollism  in remission since 2014   Drug use: Yes    Types: Marijuana    Comment: occas.   Sexual activity: Yes    Partners: Male    Birth control/protection: Surgical  Other Topics Concern   Not on file  Social History Narrative   Former [facility] patient.      Was on disability at one point.   Return to the workforce.  40 hours a week at [employer], 10 hours a week on the weekends at [facility] [employer] in [place] at night 10 to 12 hours a week.      Has grown children, she lives alone with a pet, continues to smoke no alcohol or drug use at this time      History of EtOH abuse and THC use.         Social Determinants of Health   Financial Resource Strain: Not on file  Food Insecurity: Not on file  Transportation Needs: Not on file  Physical Activity: Not on file  Stress: Not on file  Social Connections: Not on file  Intimate Partner Violence: Not on file    Past Surgical Hx:  Past Surgical History:  Procedure Laterality Date   CESAREAN SECTION  1995   w/  Bilateral Tubal Ligation   COLONOSCOPY  last one 08-09-2013   CYSTOSCOPY W/ URETERAL STENT PLACEMENT Left 03/29/2016   Procedure: CYSTOSCOPY WITH STENT REPLACEMENT;  Surgeon: Nickie Retort, MD;  Location: [facility];  Service: Urology;  Laterality: Left;   CYSTOSCOPY WITH RETROGRADE PYELOGRAM, URETEROSCOPY AND STENT PLACEMENT Left 03/08/2016   Procedure: CYSTOSCOPY WITH  LEFT RETROGRADE PYELOGRAM, AND STENT PLACEMENT;  Surgeon: Nickie Retort, MD;  Location: [facility];  Service: Urology;  Laterality: Left;   CYSTOSCOPY/RETROGRADE/URETEROSCOPY/STONE EXTRACTION WITH BASKET Left 03/29/2016   Procedure: CYSTOSCOPY/RETROGRADE/URETEROSCOPY/STONE EXTRACTION WITH BASKET;  Surgeon: Nickie Retort, MD;  Location[facility];  Service: Urology;  Laterality: Left;   ENDOMETRIAL ABLATION W/ NOVASURE  04-01-2010   ESOPHAGOGASTRODUODENOSCOPY  last one 08-09-2013    KNEE ARTHROSCOPY Left as teen   LASER ABLATION OF THE CERVIX  2012 approx   MASTECTOMY WITH AXILLARY LYMPH NODE DISSECTION Left 07/27/2020   Procedure: LEFT MASTECTOMY WITH LEFT RADIOACTIVE SEED GUIDED TARGETED AXILLARY LYMPH NODE DISSECTION;  Surgeon: Alphonsa Overall, MD;  Location: [facility];  Service: General;  Laterality: Left;   PORTACATH  PLACEMENT Right 03/06/2020   Procedure: INSERTION PORT-A-CATH WITH ULTRASOUND GUIDANCE;  Surgeon: Alphonsa Overall, MD;  Location: [facility];  Service: General;  Laterality: Right;   TRANSTHORACIC ECHOCARDIOGRAM  05-19-2006   lvsf normal, ef 55-65%, there was mild flattening of the interventricular septum during diastoli/  RV size at upper limits normal   TUBAL LIGATION     WISDOM TOOTH EXTRACTION  age 22 's    Past Medical Hx:  Past Medical History:  Diagnosis Date   Arthritis    Bipolar 1 disorder ([place])    Cancer ([place])    Complication of anesthesia    wakes up during procedures   Depression    GAD (generalized anxiety disorder)    Genital HSV    currently per pt  no break out 03-22-2016    GERD (gastroesophageal reflux disease)    Hiatal hernia    History of cervical dysplasia    2012 laser ablation   History of esophageal dilatation    for dysphasia -- x2 dilated   History of gastric ulcer    History of Helicobacter pylori infection    remote hx   History of hidradenitis suppurativa    "gets all over body intermittantly"     History of hypertension    no issue since stopped drinking alcohol 2014   History of kidney stones    History of panic attacks    History of radiation therapy 03/16/2020-05/08/2020   vulva  Dr Gery Pray   History of radiation therapy 09/10/2020-10/23/2020   left breast  Dr Gery Pray   Hypertension    Iron deficiency anemia    Left ureteral stone    OCD (obsessive compulsive disorder)    PONV (postoperative nausea and vomiting)    Pre-diabetes    PTSD (post-traumatic stress disorder)    Recovering  alcoholic in remission ([place])    since 2014   RLS (restless legs syndrome)    Smokers' cough ([place])    Urgency of urination    Yeast infection involving the vagina and surrounding area    secondary to taking antibiotic    Past Gynecological History:  See HPI, hx of cesarean sections No LMP recorded. Patient has had an ablation.  Family Hx:  Family History  Problem Relation Age of Onset   Heart disease Father    Lung cancer Father        d. 58   Alcohol abuse Father    Heart disease Mother    Depression Mother    Anxiety disorder Mother    Drug abuse Brother    Alcohol abuse Brother    Drug abuse Brother    ADD / ADHD Brother    Colon polyps Brother    Cancer Paternal Grandfather        "stomach"   Diabetes Maternal Grandfather    Diabetes Paternal Grandmother    Kidney disease Maternal Uncle    Cirrhosis Cousin        alcoholic   Anxiety disorder Maternal Aunt    Depression Maternal Aunt    Cancer Cousin        maternal; ovarian cancer or other "female" cancer?   Lung cancer Paternal Uncle 78   Throat cancer Cousin        paternal; dx 22s   Lung cancer Cousin        paternal; dx 13s    Review of Systems:  Constitutional  + hot flashes   ENT Normal appearing ears and  nares bilaterally Skin/Breast  No rash, sores, jaundice, itching, dryness Cardiovascular  No chest pain, shortness of breath, or edema  Pulmonary  No cough or wheeze.  Gastro Intestinal  No nausea, vomitting, or diarrhoea. No bright red blood per rectum, no abdominal pain, change in bowel movement, or constipation.  Genito Urinary  No frequency, urgency, dysuria, no hematuria, no vulvar pruritis. She has amenorrhea since uterine ablation. Musculo Skeletal  No myalgia, arthralgia, joint swelling or pain  Neurologic  No weakness, numbness, change in gait,  Psychology  No depression, anxiety, insomnia.   Vitals:  Blood pressure 132/76, pulse 97, temperature 98.5 F (36.9 C), temperature  source Oral, resp. rate 18, height 5' 5.5" (1.664 m), weight 195 lb (88.5 kg), SpO2 99 %.  Physical Exam: WD in NAD Neck  Supple NROM, without any enlargements.  Lymph Node Survey No cervical supraclavicular or inguinal adenopathy Cardiovascular  Pulse normal rate, regularity and rhythm. S1 and S2 normal.  Lungs  Clear to auscultation bilateraly, without wheezes/crackles/rhonchi. Good air movement.  Skin  No rash/lesions/breakdown  Psychiatry  Alert and oriented to person, place, and time  Abdomen  Normoactive bowel sounds, abdomen soft, non-tender and mildly obese without evidence of hernia.  Back No CVA tenderness Genito Urinary  Vulva/vagina: Normal external female genitalia.   No lesions. No discharge or bleeding. Acetic acid was applied and no lesions were seen.  There is some rigidity and scarring at the midline perineum.  Bladder/urethra:  No lesions or masses, well supported bladder  Vagina: normal  Cervix: Normal appearing, no lesions.  Uterus:  Small, mobile, no parametrial involvement or nodularity.  Adnexa: no palpable masses. Rectal  No palpable tumor in the rectum, no cul de sac nodularity.  Extremities  No bilateral cyanosis, clubbing or edema.   Thereasa Solo, MD  05/18/2021, 7:01 AM

## 2021-05-18 NOTE — Anesthesia Postprocedure Evaluation (Signed)
Anesthesia Post Note  Patient: Sandra Miller  Procedure(s) Performed: XI ROBOTIC ASSISTED TOTAL HYSTERECTOMY WITH BILATERAL SALPINGO OOPHORECTOMY VULVAR BIOPSY REMOVAL PORT-A-CATH (Right)     Patient location during evaluation: PACU Anesthesia Type: General Level of consciousness: awake Pain management: pain level controlled Vital Signs Assessment: post-procedure vital signs reviewed and stable Respiratory status: spontaneous breathing Cardiovascular status: stable Postop Assessment: no apparent nausea or vomiting Anesthetic complications: no   No notable events documented.  Last Vitals:  Vitals:   05/18/21 1030 05/18/21 1045  BP: (!) 155/95 (!) 163/86  Pulse: 82 86  Resp: 10 15  Temp:  36.6 C  SpO2: 93% 95%    Last Pain:  Vitals:   05/18/21 1045  TempSrc:   PainSc: 4                  Kellyann Ordway

## 2021-05-19 ENCOUNTER — Telehealth: Payer: Self-pay

## 2021-05-19 ENCOUNTER — Encounter (HOSPITAL_COMMUNITY): Payer: Self-pay | Admitting: Gynecologic Oncology

## 2021-05-19 LAB — SURGICAL PATHOLOGY

## 2021-05-19 LAB — CYTOLOGY - NON PAP

## 2021-05-19 NOTE — Telephone Encounter (Addendum)
Spoke with Ms. Mayall this morning. She states she is not eating much as her appetite  is not back, drinking and urinating well. She has not had a BM. She feels like she needs to move her bowels but does not want to strain.  She has taken three doses of 2 senokot-S. She is going to add Doculax today.  She has no N/V. She is not passing gas.  Encouraged her to drink plenty of water. She denies fever or chills. Incisions are dry and intact. Her pain is controlled with tramadol and Tylenol. Ms Clasen will resume Femara on 05-26-21 and the Xarelto on 05-20-21.  Instructed to call office with any fever, chills, purulent drainage, uncontrolled pain or any other questions or concerns. Patient verbalizes understanding.  Pt aware of post op appointments as well as the office number [phone] and after hours number [phone] to call if she has any questions or concerns

## 2021-05-21 ENCOUNTER — Encounter (HOSPITAL_COMMUNITY): Payer: Self-pay | Admitting: Gynecologic Oncology

## 2021-05-22 ENCOUNTER — Other Ambulatory Visit: Payer: Self-pay | Admitting: Adult Health

## 2021-05-22 DIAGNOSIS — C519 Malignant neoplasm of vulva, unspecified: Secondary | ICD-10-CM

## 2021-05-24 ENCOUNTER — Other Ambulatory Visit (HOSPITAL_COMMUNITY): Payer: Self-pay

## 2021-05-24 ENCOUNTER — Telehealth: Payer: Self-pay

## 2021-05-24 ENCOUNTER — Other Ambulatory Visit: Payer: Self-pay

## 2021-05-24 MED ORDER — PROMETHAZINE HCL 25 MG PO TABS
ORAL_TABLET | Freq: Four times a day (QID) | ORAL | 0 refills | Status: DC | PRN
  Filled 2021-05-24: qty 30, 7d supply, fill #0

## 2021-05-24 NOTE — Telephone Encounter (Signed)
Returning call to [place]. Patient states she has had nausea since her surgery on 05/18/21. She states she had promethazine leftover from chemo and has been taking that every 6 hours without relief. She has a decreased appetite but is trying to eat. Patient states she is only taking tylenol for her pain. She reports increasing her water intake to 60-80 ounces daily. Pt. States senokot did not work for her but since switching to dulcolax she has been moving her bowels. MD notified. Per Dr. Denman George, we can call in more nausea meds, it is most likely post op nausea. Patient is agreeable to this. Encouraged her to eat smaller meals throughout the day that are bland and to drink plenty of fluids.Patient verbalized understanding and will call with any questions or concerns.

## 2021-05-26 ENCOUNTER — Encounter: Payer: Self-pay | Admitting: Radiology

## 2021-05-27 ENCOUNTER — Ambulatory Visit
Admission: RE | Admit: 2021-05-27 | Discharge: 2021-05-27 | Disposition: A | Discharge status: Home / Self Care | Payer: PRIVATE HEALTH INSURANCE | Source: Ambulatory Visit | Attending: Radiation Oncology | Admitting: Radiation Oncology

## 2021-05-27 ENCOUNTER — Encounter: Payer: Self-pay | Admitting: Gynecologic Oncology

## 2021-05-28 ENCOUNTER — Telehealth: Payer: Self-pay | Admitting: Radiation Oncology

## 2021-05-28 NOTE — Telephone Encounter (Signed)
Called patient to r/s her 9/22 FU w/ Dr. Sondra Come. No answer, LVM for return call.

## 2021-05-31 ENCOUNTER — Other Ambulatory Visit: Payer: Self-pay

## 2021-05-31 ENCOUNTER — Inpatient Hospital Stay: Payer: PRIVATE HEALTH INSURANCE | Attending: Gynecologic Oncology | Admitting: Gynecologic Oncology

## 2021-05-31 VITALS — BP 115/75 | HR 87 | Temp 98.5°F | Resp 16 | Ht 65.0 in | Wt 189.4 lb

## 2021-05-31 DIAGNOSIS — Z1509 Genetic susceptibility to other malignant neoplasm: Secondary | ICD-10-CM

## 2021-05-31 DIAGNOSIS — Z9079 Acquired absence of other genital organ(s): Secondary | ICD-10-CM | POA: Insufficient documentation

## 2021-05-31 DIAGNOSIS — Z148 Genetic carrier of other disease: Secondary | ICD-10-CM | POA: Diagnosis not present

## 2021-05-31 DIAGNOSIS — Z90722 Acquired absence of ovaries, bilateral: Secondary | ICD-10-CM | POA: Insufficient documentation

## 2021-05-31 DIAGNOSIS — C519 Malignant neoplasm of vulva, unspecified: Secondary | ICD-10-CM

## 2021-05-31 DIAGNOSIS — Z1501 Genetic susceptibility to malignant neoplasm of breast: Secondary | ICD-10-CM | POA: Insufficient documentation

## 2021-05-31 DIAGNOSIS — Z8544 Personal history of malignant neoplasm of other female genital organs: Secondary | ICD-10-CM | POA: Diagnosis present

## 2021-05-31 DIAGNOSIS — Z1589 Genetic susceptibility to other disease: Secondary | ICD-10-CM

## 2021-05-31 DIAGNOSIS — C50212 Malignant neoplasm of upper-inner quadrant of left female breast: Secondary | ICD-10-CM | POA: Insufficient documentation

## 2021-05-31 DIAGNOSIS — C50512 Malignant neoplasm of lower-outer quadrant of left female breast: Secondary | ICD-10-CM

## 2021-05-31 DIAGNOSIS — Z1502 Genetic susceptibility to malignant neoplasm of ovary: Secondary | ICD-10-CM | POA: Diagnosis present

## 2021-05-31 DIAGNOSIS — Z90710 Acquired absence of both cervix and uterus: Secondary | ICD-10-CM | POA: Diagnosis not present

## 2021-05-31 NOTE — Patient Instructions (Signed)
Please avoid intercourse for another 6 weeks  Wait 2 weeks before resuming all other normal activities (including heavy lifting).  Please notify Dr Denman George at phone number [phone] if you notice vulvar itch, a painless lump or bump, vulvar skin discoloration, or a new swelling in one leg.   Dr Denman George is departing the [place] at [facility] in October, 2022. Her partners and colleagues including Dr Berline Lopes, Dr Delsa Sale and Joylene John, Nurse Practitioner will be available to continue your care.   You are next scheduled to return to the Gynecologic Oncology office at the [facility] in 3 months (December). Please call [phone] in November to request an appointment for December with Dr Serita Grit partner, Dr Berline Lopes.

## 2021-05-31 NOTE — Progress Notes (Signed)
Follow-up Note: Gyn-Onc  Consult was requested by Dr. Lindi Adie for the evaluation of Sandra Miller 45 y.o. female  CC:  Chief Complaint  Patient presents with   Mutation in BRIP1 gene    Assessment/Plan:  Sandra Miller  is a 45 y.o.  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 S/p primary radiation completed 05/08/20 with external beam to the pelvis (45 Gy) and boost to the vulva (16.2 Gy). Complete clinical and pathologic response.  Recommend vulva inspection at 3 monthly intervals until September, 2023.  2/ Deleterious mutation in BRIP 1 conferring increased hereditary risk of ovarian cancer. S/p robotic assisted total hysterectomy with BSO for risk reduction in September, 2022.   HPI: Ms Sandra Miller is a 45 year old parous woman with a history of vulvar cancer who was seen in consultation at the request of Dr Lindi Adie for evaluation of a deleterious mutation in [place].  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  At the time of seeking consultation for sentinel lymph node biopsy with Dr. Thurston Pounds at [facility], it was determined that she was not a good candidate for primary radical vulvectomy due to the close proximity of the lesion to her anal sphincter.  Therefore she was treated with definitive external beam radiation to the lower pelvis and vulvar tissues.  Radiation dosing was between 03/16/20 through 05/08/20. It consisted of IMRT to the pelvis (45 Gy) and IMRT boost to the vulva (16.2 Gy).  Simultaneous to her diagnosis of vulvar cancer was a diagnosis of ER/PR positive breast cancer on the left.  This was stage II with positive lymph node.  She underwent treatment with Adriamycin and Taxotere  followed by left mastectomy, trastuzumab infusion for HER-2 positive lesion, and was planned for chest wall radiation beginning in the new year of 2022.  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  Screening ultrasound of the pelvis on 11/06/2020 revealed a uterus measuring 9.1 x 3.3 x 4.3 cm with an endometrial thickness of 6 mm and bilateral ovaries which were normal in size and appearance.  Interval Hx:  On 05/18/21 she underwent robotic assisted total hysterectomy, BSO and vulvar biopsy. Intraoperative findings were significant for complete clinical response at vulva, normal appearing tubes and ovaries, normal appearing fibroid uterus. Surgery was uncomplicated.  Final pathology revealed benign tubes , ovaries, uterus. VIN 1 at vulva.  Since surgery she has done well with no major complaints.    Current Meds:  Outpatient Encounter Medications as of 05/31/2021  Medication Sig   acetaminophen (TYLENOL) 500 MG tablet Take 2 tablets (1,000 mg total) by mouth every 6 (six) hours as needed for moderate pain or fever. Avoid tylenol until your liver function back to normal   Cholecalciferol (VITAMIN D3) 125 MCG (5000 UT) CAPS Take by mouth.   diclofenac Sodium (VOLTAREN) 1 % GEL Apply 4 grams topically 4  times daily as needed.   diphenoxylate-atropine (LOMOTIL) 2.5-0.025 MG tablet TAKE 1 TO 2 TABLETS BY MOUTH FOUR TIMES DAILY AS NEEDED FOR DIARRHEA (Patient taking differently: Take  1-2 tablets by mouth 4 (four) times daily as needed for diarrhea or loose stools. TAKE 1 TO 2 TABLETS BY MOUTH FOUR TIMES DAILY AS NEEDED FOR DIARRHEA)   doxylamine, Sleep, (UNISOM) 25 MG tablet Take 25 mg by mouth at bedtime as needed for sleep.   gabapentin (NEURONTIN) 300 MG capsule TAKE 1 CAPSULE BY MOUTH ONCE A DAY (Patient taking differently: Take 300 mg by mouth daily.)    Glucosamine 750 MG TABS Take 750 mg by mouth in the morning and at bedtime.   letrozole (FEMARA) 2.5 MG tablet Take 1 tablet (2.5 mg total) by mouth daily.   lisinopril (ZESTRIL) 20 MG tablet TAKE 1/2 TABLET BY MOUTH ONCE A DAY   loratadine (CLARITIN) 10 MG tablet Take 10 mg by mouth daily.   LORazepam (ATIVAN) 1 MG tablet Take 1 tablet by mouth 3 times daily as needed for anxiety.   magnesium oxide (MAG-OX) 400 (241.3 Mg) MG tablet Take 1 tablet (400 mg total) by mouth 2 (two) times daily.   Multiple Vitamin (MULTIVITAMIN WITH MINERALS) TABS tablet Take 1 tablet by mouth daily.   nicotine (NICODERM CQ) 21 mg/24hr patch Place 1 patch (21 mg total) onto the skin daily.   OLANZapine (ZYPREXA) 7.5 MG tablet Take 1 tablet (7.5 mg total) by mouth at bedtime.   omeprazole (PRILOSEC) 40 MG capsule TAKE 1 CAPSULE BY MOUTH EVERY MORNING AND EVERY NIGHT AT BEDTIME (Patient taking differently: Take 40 mg by mouth in the morning and at bedtime.)   promethazine (PHENERGAN) 25 MG tablet TAKE 1 TABLET BY MOUTH EVERY 6 HOURS AS NEEDED FOR NAUSEA   rivaroxaban (XARELTO) 20 MG TABS tablet TAKE 1 TABLET BY MOUTH DAILY WITH SUPPER. (Patient taking differently: Take 20 mg by mouth daily with supper.)   rosuvastatin (CRESTOR) 10 MG tablet TAKE 1 TABLET BY MOUTH ONCE A DAY   senna-docusate (SENOKOT-S) 8.6-50 MG tablet Take 2 tablets by mouth at bedtime. For AFTER surgery only, do not take if having diarrhea   Turmeric 450 MG CAPS Take 450 mg by mouth in the morning and at bedtime.   venlafaxine XR (EFFEXOR-XR) 37.5 MG 24 hr capsule TAKE 3 CAPSULES BY MOUTH ONCE A DAY   lidocaine-prilocaine (EMLA) cream Apply to affected area once (Patient not taking: No sig reported)   traMADol (ULTRAM) 50 MG tablet Take 1 tablet (50 mg total) by mouth every 6 (six) hours as needed for severe pain. For AFTER surgery only, do not take and drive (Patient not taking: Reported on 05/27/2021)   [DISCONTINUED] prochlorperazine (COMPAZINE) 10 MG  tablet TAKE 1 TABLET BY MOUTH EVERY 6 HOURS AS NEEDED FOR NAUSEA OR VOMITING (Patient taking differently: Take 10 mg by mouth every 6 (six) hours as needed for nausea or vomiting. )   Facility-Administered Encounter Medications as of 05/31/2021  Medication   LORazepam (ATIVAN) injection 0.5 mg    Allergy:  Allergies  Allergen Reactions   Dilaudid [Hydromorphone Hcl] Other (See Comments)    Pt became confused, pulled out iv's, does not remember anything   Depakote [Divalproex Sodium] Nausea And Vomiting   Minocycline Hives   Aspirin Hives    States able to tolerate Goody Powders without any problem except for GI upset    Social Hx:   Social History   Socioeconomic History   Marital status: Single    Spouse name: Not on file   Number of children: Not on file   Years of education: Not on file   Highest education  level: Not on file  Occupational History   Not on file  Tobacco Use   Smoking status: Every Day    Packs/day: 0.50    Years: 22.00    Pack years: 11.00    Types: Cigarettes   Smokeless tobacco: Never   Tobacco comments:    Recently started a smoking cessation class.   Vaping Use   Vaping Use: Never used  Substance and Sexual Activity   Alcohol use: Not Currently    Alcohol/week: 0.0 standard drinks    Comment:  hx alcohollism  in remission since 2014   Drug use: Yes    Types: Marijuana    Comment: occas.   Sexual activity: Yes    Partners: Male    Birth control/protection: Surgical  Other Topics Concern   Not on file  Social History Narrative   Former [facility] patient.      Was on disability at one point.   Return to the workforce.  40 hours a week at [employer], 10 hours a week on the weekends at [employer] in [place] at night 10 to 12 hours a week.      Has grown children, she lives alone with a pet, continues to smoke no alcohol or drug use at this time      History of EtOH abuse and THC use.         Social Determinants  of Health   Financial Resource Strain: Not on file  Food Insecurity: Not on file  Transportation Needs: Not on file  Physical Activity: Not on file  Stress: Not on file  Social Connections: Not on file  Intimate Partner Violence: Not on file    Past Surgical Hx:  Past Surgical History:  Procedure Laterality Date   CESAREAN SECTION  1995   w/  Bilateral Tubal Ligation   COLONOSCOPY  last one 08-09-2013   CYSTOSCOPY W/ URETERAL STENT PLACEMENT Left 03/29/2016   Procedure: CYSTOSCOPY WITH STENT REPLACEMENT;  Surgeon: Nickie Retort, MD;  Location: [facility];  Service: Urology;  Laterality: Left;   CYSTOSCOPY WITH RETROGRADE PYELOGRAM, URETEROSCOPY AND STENT PLACEMENT Left 03/08/2016   Procedure: CYSTOSCOPY WITH  LEFT RETROGRADE PYELOGRAM, AND STENT PLACEMENT;  Surgeon: Nickie Retort, MD;  Location: [facility];  Service: Urology;  Laterality: Left;   CYSTOSCOPY/RETROGRADE/URETEROSCOPY/STONE EXTRACTION WITH BASKET Left 03/29/2016   Procedure: CYSTOSCOPY/RETROGRADE/URETEROSCOPY/STONE EXTRACTION WITH BASKET;  Surgeon: Nickie Retort, MD;  Location[facility];  Service: Urology;  Laterality: Left;   ENDOMETRIAL ABLATION W/ NOVASURE  04-01-2010   ESOPHAGOGASTRODUODENOSCOPY  last one 08-09-2013   KNEE ARTHROSCOPY Left as teen   LASER ABLATION OF THE CERVIX  2012 approx   MASTECTOMY WITH AXILLARY LYMPH NODE DISSECTION Left 07/27/2020   Procedure: LEFT MASTECTOMY WITH LEFT RADIOACTIVE SEED GUIDED TARGETED AXILLARY LYMPH NODE DISSECTION;  Surgeon: Alphonsa Overall, MD;  Location: [place];  Service: General;  Laterality: Left;   PORT-A-CATH REMOVAL Right 05/18/2021   Procedure: REMOVAL PORT-A-CATH;  Surgeon: Donnie Mesa, MD;  Location: [facility];  Service: General;  Laterality: Right;   PORTACATH PLACEMENT Right 03/06/2020   Procedure: INSERTION PORT-A-CATH WITH ULTRASOUND GUIDANCE;  Surgeon: Alphonsa Overall, MD;  Location: [facility];  Service: General;   Laterality: Right;   ROBOTIC ASSISTED TOTAL HYSTERECTOMY WITH BILATERAL SALPINGO OOPHERECTOMY N/A 05/18/2021   Procedure: XI ROBOTIC ASSISTED TOTAL HYSTERECTOMY WITH BILATERAL SALPINGO OOPHORECTOMY;  Surgeon: Everitt Amber, MD;  Location: [facility];  Service: Gynecology;  Laterality: N/A;  TRANSTHORACIC ECHOCARDIOGRAM  05-19-2006   lvsf normal, ef 55-65%, there was mild flattening of the interventricular septum during diastoli/  RV size at upper limits normal   TUBAL LIGATION     VULVA /PERINEUM BIOPSY N/A 05/18/2021   Procedure: VULVAR BIOPSY;  Surgeon: Everitt Amber, MD;  Location: [facility];  Service: Gynecology;  Laterality: N/A;   WISDOM TOOTH EXTRACTION  age 47 's    Past Medical Hx:  Past Medical History:  Diagnosis Date   Arthritis    Bipolar 1 disorder ([place])    Cancer ([place])    Complication of anesthesia    wakes up during procedures   Depression    GAD (generalized anxiety disorder)    Genital HSV    currently per pt  no break out 03-22-2016    GERD (gastroesophageal reflux disease)    Hiatal hernia    History of cervical dysplasia    2012 laser ablation   History of esophageal dilatation    for dysphasia -- x2 dilated   History of gastric ulcer    History of Helicobacter pylori infection    remote hx   History of hidradenitis suppurativa    "gets all over body intermittantly"     History of hypertension    no issue since stopped drinking alcohol 2014   History of kidney stones    History of panic attacks    History of radiation therapy 03/16/2020-05/08/2020   vulva  Dr Gery Pray   History of radiation therapy 09/09/2020-10/23/2020   left chest wall/left SCV   Dr Gery Pray   Hypertension    Iron deficiency anemia    Left ureteral stone    OCD (obsessive compulsive disorder)    PONV (postoperative nausea and vomiting)    Pre-diabetes    PTSD (post-traumatic stress disorder)    Recovering alcoholic in remission ([place])    since 2014   RLS (restless legs syndrome)     Smokers' cough ([place])    Urgency of urination    Yeast infection involving the vagina and surrounding area    secondary to taking antibiotic    Past Gynecological History:  See HPI, hx of cesarean sections No LMP recorded. Patient has had an ablation.  Family Hx:  Family History  Problem Relation Age of Onset   Heart disease Father    Lung cancer Father        d. 82   Alcohol abuse Father    Heart disease Mother    Depression Mother    Anxiety disorder Mother    Drug abuse Brother    Alcohol abuse Brother    Drug abuse Brother    ADD / ADHD Brother    Colon polyps Brother    Cancer Paternal Grandfather        "stomach"   Diabetes Maternal Grandfather    Diabetes Paternal Grandmother    Kidney disease Maternal Uncle    Cirrhosis Cousin        alcoholic   Anxiety disorder Maternal Aunt    Depression Maternal Aunt    Cancer Cousin        maternal; ovarian cancer or other "female" cancer?   Lung cancer Paternal Uncle 36   Throat cancer Cousin        paternal; dx 78s   Lung cancer Cousin        paternal; dx 69s    Review of Systems:  Constitutional  + hot flashes   ENT Normal appearing ears and  nares bilaterally Skin/Breast  No rash, sores, jaundice, itching, dryness Cardiovascular  No chest pain, shortness of breath, or edema  Pulmonary  No cough or wheeze.  Gastro Intestinal  No nausea, vomitting, or diarrhoea. No bright red blood per rectum, no abdominal pain, change in bowel movement, or constipation.  Genito Urinary  No frequency, urgency, dysuria, no hematuria, no vulvar pruritis. no bleeding Musculo Skeletal  No myalgia, arthralgia, joint swelling or pain  Neurologic  No weakness, numbness, change in gait,  Psychology  No depression, anxiety, insomnia.   Vitals:  Blood pressure 115/75, pulse 87, temperature 98.5 F (36.9 C), temperature source Oral, resp. rate 16, height $RemoveBe'5\' 5"'VgxUjtOVg$  (1.651 m), weight 189 lb 6.4 oz (85.9 kg), SpO2 100 %.  Physical  Exam: WD in NAD Neck  Supple NROM, without any enlargements.  Lymph Node Survey No cervical supraclavicular or inguinal adenopathy Cardiovascular  Well perfused peripheries Lungs  No increased WOB Skin  No rash/lesions/breakdown  Psychiatry  Alert and oriented to person, place, and time  Abdomen  Normoactive bowel sounds, abdomen soft, non-tender and mildly obese without evidence of hernia. Incisions well healed Back No CVA tenderness Genito Urinary  Vulva/vagina: smooth vaginal cuff, in tact, no bleeding. Vulva: radiation change and edema, biopsy site well healed, no visible or palpable tumor.  Rectal  No palpable tumor in the rectum/anus, no cul de sac nodularity.  Extremities  No bilateral cyanosis, clubbing or edema.   30 minutes of direct face to face counseling time was spent with the patient. This included discussion about prognosis, therapy recommendations and postoperative side effects and are beyond the scope of routine postoperative care and focused on management of her vulvar cancer which is a separate issue to her recent surgery for BRIP1 mutation.   Thereasa Solo, MD  05/31/2021, 9:58 AM

## 2021-06-01 ENCOUNTER — Telehealth (HOSPITAL_COMMUNITY): Payer: Self-pay | Admitting: Psychiatry

## 2021-06-02 ENCOUNTER — Ambulatory Visit: Payer: Self-pay | Admitting: Gynecologic Oncology

## 2021-06-02 ENCOUNTER — Other Ambulatory Visit (HOSPITAL_COMMUNITY): Payer: Self-pay

## 2021-06-04 ENCOUNTER — Encounter: Payer: Self-pay | Admitting: *Deleted

## 2021-06-04 NOTE — Progress Notes (Signed)
RN received Sandra Miller and Diplomatic Services operational officer for [employer].  MD filled out provider request and RN successfully faxed to number provided.  RN placed copy of paperwork at the front desk for pt to pick up and fill out pt portion.  Pt notified and verbalized understanding.

## 2021-06-09 ENCOUNTER — Other Ambulatory Visit (HOSPITAL_COMMUNITY): Payer: Self-pay | Admitting: Psychiatry

## 2021-06-09 ENCOUNTER — Other Ambulatory Visit: Payer: Self-pay | Admitting: Hematology and Oncology

## 2021-06-09 ENCOUNTER — Other Ambulatory Visit: Payer: Self-pay | Admitting: Nurse Practitioner

## 2021-06-09 DIAGNOSIS — F319 Bipolar disorder, unspecified: Secondary | ICD-10-CM

## 2021-06-10 ENCOUNTER — Other Ambulatory Visit (HOSPITAL_COMMUNITY): Payer: Self-pay

## 2021-06-10 ENCOUNTER — Other Ambulatory Visit (HOSPITAL_COMMUNITY): Payer: Self-pay | Admitting: Psychiatry

## 2021-06-10 ENCOUNTER — Encounter: Payer: Self-pay | Admitting: Hematology and Oncology

## 2021-06-10 DIAGNOSIS — F319 Bipolar disorder, unspecified: Secondary | ICD-10-CM

## 2021-06-10 MED ORDER — RIVAROXABAN 20 MG PO TABS
ORAL_TABLET | Freq: Every day | ORAL | 6 refills | Status: DC
  Filled 2021-06-10: qty 30, 30d supply, fill #0
  Filled 2021-07-09: qty 30, 30d supply, fill #1
  Filled 2021-08-13: qty 30, 30d supply, fill #2
  Filled 2021-09-14: qty 30, 30d supply, fill #3
  Filled 2021-10-10: qty 30, 30d supply, fill #4

## 2021-06-10 MED ORDER — OMEPRAZOLE 40 MG PO CPDR
DELAYED_RELEASE_CAPSULE | ORAL | 3 refills | Status: DC
  Filled 2021-06-10: qty 60, 30d supply, fill #0
  Filled 2021-07-09: qty 60, 30d supply, fill #1
  Filled 2021-08-13: qty 60, 30d supply, fill #2
  Filled 2021-09-14: qty 60, 30d supply, fill #3

## 2021-06-10 MED ORDER — GABAPENTIN 300 MG PO CAPS
300.0000 mg | ORAL_CAPSULE | Freq: Every day | ORAL | 5 refills | Status: DC
  Filled 2021-06-10 – 2021-07-03 (×2): qty 30, 30d supply, fill #0
  Filled 2021-08-05: qty 30, 30d supply, fill #1
  Filled 2021-08-31: qty 30, 30d supply, fill #2
  Filled 2021-10-01: qty 30, 30d supply, fill #3
  Filled 2021-11-09: qty 30, 30d supply, fill #4
  Filled 2021-12-14: qty 30, 30d supply, fill #5

## 2021-06-11 ENCOUNTER — Telehealth: Payer: Self-pay

## 2021-06-11 NOTE — Telephone Encounter (Signed)
Received call from [name] [id number]. Patient reports discharge starting 3 days ago and noticed an odor today. She states discharge has no color, it's not thick like  yeast but not thin like water. "It's similar to a bacterial infection type of discharge." She has cramping in her lower abdomen that comes and goes, patient states she thinks this is from constipation. She has been taking senokot for this. She denies fever or chills. Instructed patient that a provider would be notified and follow up with her. It may be Monday before our office contacts her. Provided patient with after hours number should she need it. If she develops increasing pain, fever or chills to seek care at the ER.

## 2021-06-13 NOTE — Telephone Encounter (Signed)
Please call patient on Monday - if symptoms haven't improved, she can come in Monday or Tuesday for visit with Melissa. Thanks!

## 2021-06-14 ENCOUNTER — Other Ambulatory Visit (HOSPITAL_COMMUNITY): Payer: Self-pay

## 2021-06-14 ENCOUNTER — Encounter (HOSPITAL_COMMUNITY): Payer: Self-pay | Admitting: Psychiatry

## 2021-06-14 ENCOUNTER — Other Ambulatory Visit: Payer: Self-pay

## 2021-06-14 ENCOUNTER — Telehealth (HOSPITAL_BASED_OUTPATIENT_CLINIC_OR_DEPARTMENT_OTHER): Payer: Self-pay | Admitting: Psychiatry

## 2021-06-14 ENCOUNTER — Encounter: Payer: Self-pay | Admitting: Hematology and Oncology

## 2021-06-14 ENCOUNTER — Telehealth: Payer: Self-pay

## 2021-06-14 VITALS — Wt 189.0 lb

## 2021-06-14 DIAGNOSIS — F411 Generalized anxiety disorder: Secondary | ICD-10-CM

## 2021-06-14 DIAGNOSIS — F319 Bipolar disorder, unspecified: Secondary | ICD-10-CM

## 2021-06-14 MED ORDER — VENLAFAXINE HCL ER 37.5 MG PO CP24
ORAL_CAPSULE | ORAL | 2 refills | Status: DC
  Filled 2021-06-14: qty 90, fill #0
  Filled 2021-07-09: qty 90, 30d supply, fill #0
  Filled 2021-08-13: qty 90, 30d supply, fill #1

## 2021-06-14 MED ORDER — OLANZAPINE 7.5 MG PO TABS
7.5000 mg | ORAL_TABLET | Freq: Every day | ORAL | 2 refills | Status: DC
  Filled 2021-06-14: qty 30, 30d supply, fill #0
  Filled 2021-07-25: qty 30, 30d supply, fill #1
  Filled 2021-08-31: qty 30, 30d supply, fill #2

## 2021-06-14 MED ORDER — GABAPENTIN 400 MG PO CAPS
400.0000 mg | ORAL_CAPSULE | Freq: Every day | ORAL | 0 refills | Status: DC
Start: 2021-06-14 — End: 2021-07-26
  Filled 2021-06-14: qty 30, 30d supply, fill #0

## 2021-06-14 MED ORDER — LORAZEPAM 1 MG PO TABS
1.0000 mg | ORAL_TABLET | Freq: Three times a day (TID) | ORAL | 2 refills | Status: DC | PRN
  Filled 2021-06-14 – 2021-07-09 (×2): qty 90, 30d supply, fill #0
  Filled 2021-08-13: qty 90, 30d supply, fill #1

## 2021-06-14 MED FILL — Olanzapine Tab 7.5 MG: ORAL | 30 days supply | Qty: 30 | Fill #0 | Status: CN

## 2021-06-14 NOTE — Progress Notes (Signed)
Virtual Visit via Telephone Note  I connected with Sandra Miller on 06/14/21 at  2:00 PM EDT by telephone and verified that I am speaking with the correct person using two identifiers.  Location: Patient: Home Provider: Home office   I discussed the limitations, risks, security and privacy concerns of performing an evaluation and management service by telephone and the availability of in person appointments. I also discussed with the patient that there may be a patient responsible charge related to this service. The patient expressed understanding and agreed to proceed.   History of Present Illness: Patient is evaluated by phone session.  She recently had a complete hysterectomy almost 4 weeks ago.  She is hoping to start working tomorrow after 4 weeks..  She still has a lot of anxiety and nervousness and she fears about everything.  She fears that her tumor will come back.  She admitted not driving on the highway and scared to drive on a rainy day.  She had a good support from her family.  She had a trip to the beach with her family member to celebrate her life.  She really enjoyed time spending with the family.  She is working as a Quarry manager at [employer].  She also taking classes at [employer] for medication Scientist, physiological.  She had exam this Wednesday and also she has to take skill examination after that.  Patient forgot to discuss with her physician adjusted dose of gabapentin.  She admitted some nights she has racing thoughts, anxiety and nervousness but denies any mania, psychosis or any hallucination.  Recently she had a blood work and her hemoglobin A1c is 6.1.  Her appetite is okay and her weight is stable.  She denies any suicidal thoughts or homicidal thoughts.  We have adjusted her medication and previous appointment and she is now taking olanzapine 7.5 mg and cut down her Ativan to take 1 mg 3 times a day.    Past Psychiatric History:  H/O bipolar disorder, alcohol  dependence and marijuana abuse.  H/O at least 6 inpatient. Tried lithium, Seroquel, Lamictal, Paxil, Celexa, Abilify, Geodon, Klonopin, Depakote, Neurontin and Cymbalta.  No H/o history of suicidal attempt.   Psychiatric Specialty Exam: Physical Exam  Review of Systems  Weight 189 lb (85.7 kg).There is no height or weight on file to calculate BMI.  General Appearance: NA  Eye Contact:  NA  Speech:  Normal Rate  Volume:  Normal  Mood:  Anxious  Affect:  NA  Thought Process:  Goal Directed  Orientation:  Full (Time, Place, and Person)  Thought Content:  Rumination  Suicidal Thoughts:  No  Homicidal Thoughts:  No  Memory:  Immediate;   Good Recent;   Good Remote;   Good  Judgement:  Intact  Insight:  Present  Psychomotor Activity:  NA  Concentration:  Concentration: Fair and Attention Span: Fair  Recall:  Good  Fund of Knowledge:  Good  Language:  Good  Akathisia:  No  Handed:  Right  AIMS (if indicated):     Assets:  Communication Skills Desire for Improvement Housing Resilience Social Support Transportation  ADL's:  Intact  Cognition:  WNL  Sleep:   fair      Assessment and Plan: Bipolar disorder type I.  Generalized anxiety disorder.  I reviewed current medication and recent blood work results.  Her hemoglobin A1c is 6.1.  Patient is still have anxiety and fear about her general health.  We talk about seeing  a therapist at [facility] who have specialization dealing with anxiety related to cancer.  Patient agreed to give a call to her physician.  She like to try higher dose of gabapentin.  Currently she is taking 300 mg at bedtime prescribed by GI.  I recommend she can try 400 mg at bedtime if that helps her anxiety and insomnia.  If patient feeling good with the 400 mg and she will call us to get more refills.  Otherwise she has 300 mg refills from her other physician and she can continue that.  Patient agreed with the plan.  We will continue venlafaxine 112.5 mg  daily, Ativan 1 mg 3 times a day and olanzapine 7.5 mg at bedtime.  Recommended to call us back if she is any question or any concern.  Follow-up in 3 months.  Follow Up Instructions:    I discussed the assessment and treatment plan with the patient. The patient was provided an opportunity to ask questions and all were answered. The patient agreed with the plan and demonstrated an understanding of the instructions.   The patient was advised to call back or seek an in-person evaluation if the symptoms worsen or if the condition fails to improve as anticipated.  I provided 20 minutes of non-face-to-face time during this encounter.   Kathlee Nations, MD

## 2021-06-14 NOTE — Telephone Encounter (Signed)
Received call from [place] inquiring if the [facility] has the latest COVID vaccine. Per pharmacy it is available and she will be able to get it at her appointment tomorrow. Patient verbalized understanding.

## 2021-06-14 NOTE — Telephone Encounter (Signed)
Left message requesting return call

## 2021-06-14 NOTE — Telephone Encounter (Signed)
Spoke with [employer], patient reports discharge has not improved and has stayed about the same. Appointment scheduled with Joylene John, NP for 06/15/21 at 0830. Patient is in agreement of date and time of appointment.

## 2021-06-15 ENCOUNTER — Telehealth: Payer: Self-pay

## 2021-06-15 ENCOUNTER — Inpatient Hospital Stay: Payer: PRIVATE HEALTH INSURANCE | Attending: Gynecologic Oncology | Admitting: Gynecologic Oncology

## 2021-06-15 ENCOUNTER — Inpatient Hospital Stay: Payer: PRIVATE HEALTH INSURANCE

## 2021-06-15 ENCOUNTER — Other Ambulatory Visit (HOSPITAL_COMMUNITY): Payer: Self-pay

## 2021-06-15 VITALS — BP 124/84 | HR 100 | Temp 98.8°F | Resp 20 | Ht 65.5 in | Wt 192.0 lb

## 2021-06-15 DIAGNOSIS — Z90722 Acquired absence of ovaries, bilateral: Secondary | ICD-10-CM | POA: Diagnosis not present

## 2021-06-15 DIAGNOSIS — N760 Acute vaginitis: Secondary | ICD-10-CM

## 2021-06-15 DIAGNOSIS — Z923 Personal history of irradiation: Secondary | ICD-10-CM | POA: Diagnosis not present

## 2021-06-15 DIAGNOSIS — Z90710 Acquired absence of both cervix and uterus: Secondary | ICD-10-CM | POA: Diagnosis not present

## 2021-06-15 DIAGNOSIS — F17210 Nicotine dependence, cigarettes, uncomplicated: Secondary | ICD-10-CM | POA: Diagnosis not present

## 2021-06-15 DIAGNOSIS — Z9221 Personal history of antineoplastic chemotherapy: Secondary | ICD-10-CM | POA: Diagnosis not present

## 2021-06-15 DIAGNOSIS — Z9012 Acquired absence of left breast and nipple: Secondary | ICD-10-CM | POA: Insufficient documentation

## 2021-06-15 DIAGNOSIS — C50912 Malignant neoplasm of unspecified site of left female breast: Secondary | ICD-10-CM | POA: Insufficient documentation

## 2021-06-15 DIAGNOSIS — B9689 Other specified bacterial agents as the cause of diseases classified elsewhere: Secondary | ICD-10-CM

## 2021-06-15 DIAGNOSIS — Z8544 Personal history of malignant neoplasm of other female genital organs: Secondary | ICD-10-CM | POA: Diagnosis not present

## 2021-06-15 DIAGNOSIS — N898 Other specified noninflammatory disorders of vagina: Secondary | ICD-10-CM

## 2021-06-15 DIAGNOSIS — Z1502 Genetic susceptibility to malignant neoplasm of ovary: Secondary | ICD-10-CM | POA: Insufficient documentation

## 2021-06-15 DIAGNOSIS — Z148 Genetic carrier of other disease: Secondary | ICD-10-CM | POA: Insufficient documentation

## 2021-06-15 LAB — WET PREP, GENITAL
Clue Cells Wet Prep HPF POC: NONE SEEN
Sperm: NONE SEEN
Trich, Wet Prep: NONE SEEN
Yeast Wet Prep HPF POC: NONE SEEN

## 2021-06-15 MED ORDER — METRONIDAZOLE 500 MG PO TABS
500.0000 mg | ORAL_TABLET | Freq: Two times a day (BID) | ORAL | 0 refills | Status: DC
  Filled 2021-06-15: qty 14, 7d supply, fill #0

## 2021-06-15 NOTE — Patient Instructions (Signed)
Your examination today was normal except for the discharge noted in the vagina. We took a sample of this and will send this to the lab to be looked at under the microscope. We will contact you with the results. I will go ahead and send in flagyl to the [facility]. It does recommend to avoid alcohol while taking the medication.   Please call the office if your symptoms persist after finishing the Flagyl. You should also be receiving information for Support Services here at the [facility] about setting up an appointment with a therapist.   We have rescheduled your COVID vaccine to next week. Please let us know if you are feeling unwell at this time and we can always adjust this. I would recommend getting the flu shot as well but to space the COVID and flu vaccine out with 3-5 days in between each.   Please call the office for any questions, concerns, or new symptoms.   Symptoms to report to your health care team include vaginal/vulvar bleeding, rectal bleeding, bloating, weight loss without effort, new and persistent pain, new and  persistent fatigue, new leg swelling, new masses (i.e., bumps in your neck or groin), new and persistent cough, new and persistent nausea and vomiting, change in bowel or bladder habits, and any other concerns.

## 2021-06-15 NOTE — Telephone Encounter (Signed)
Spoke with Ms. Fugett regarding her wet prep results. Per Joylene John, NP wet prep is showing significant amount of white blood cells indicating infection/inflammation. They do not see any yeast cells. NP recommends proceeding with the flagyl treatment and to let our office know how her symptoms are after finishing meds. Patient verbalized understanding. Instructed to call with questions or concerns.

## 2021-06-16 ENCOUNTER — Encounter: Payer: Self-pay | Admitting: Hematology and Oncology

## 2021-06-16 NOTE — Progress Notes (Signed)
Gynecologic Oncology Symptom Management  CC:  Chief Complaint  Patient presents with   Vaginal Discharge   Assessment/Plan: Ms. MELDA MERMELSTEIN  is a 45 y.o.  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  A wet prep was taken today and she will be contacted with the results. Exam findings consistent with bacterial vaginosis. Oral flagyl sent in to [place]. She is advised to contact our office if her symptoms do not improve with treatment. Reportable signs and symptoms reviewed. Support Services at the [facility] have also been contacted and they will be reaching out to the patient to discuss supportive opportunities including groups, one on one counseling. Her COVID booster has been rescheduled for next week. All questions answered. Vulva inspection at 3 monthly intervals until September, 2023 is recommended.  HPI: Michalene Debruler is a 45 year old woman with a history of vulvar cancer who was seen in consultation at the request of Dr Lindi Adie for evaluation of a deleterious mutation in [place].  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  At the time of seeking consultation for sentinel lymph node biopsy with Dr. Thurston Pounds at [facility], it was determined that she was not a good candidate for primary radical vulvectomy due to the close proximity of the lesion to her anal sphincter.  Therefore she was treated with definitive external beam radiation to the lower pelvis and vulvar tissues. Radiation dosing was between 03/16/20 through 05/08/20 and consisted of IMRT to the pelvis (45 Gy) and IMRT boost to the vulva (16.2 Gy).  Simultaneous to her diagnosis of vulvar cancer, she was diagnosed with Stage II ER/PR positive breast cancer on  the left. She underwent treatment with Adriamycin and Taxotere followed by left mastectomy, trastuzumab infusion for HER-2 positive lesion, and was planned for chest wall radiation beginning in the new year of 2022.  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  Screening ultrasound of the pelvis on 11/06/2020 revealed a uterus measuring 9.1 x 3.3 x 4.3 cm with an endometrial thickness of 6 mm and bilateral ovaries which were normal in size and appearance.  On 05/18/21 she underwent robotic assisted total hysterectomy, BSO and vulvar biopsy. Intraoperative findings were significant for complete clinical response at vulva, normal appearing tubes and ovaries, normal appearing fibroid uterus. Surgery was uncomplicated.  Final pathology revealed benign tubes , ovaries, uterus. VIN 1 at vulva.  Interval Hx: She presents today to the office for evaluation of vaginal discharge with an odor. She was last seen by Dr. Denman George on 05/31/2021 with a normal examination at that time. She noticed the discharge last week and over the weekend. The vaginal discharge is described as malodorous and not thick. No vaginal bleeding or itching reported. She denies changes to her routine (detergents, soaps, recent douching) and has not had recent sexual encounters/new partners. She has taken flagyl in the past and tolerated well.  No fever or chills. Temperature is elevated for her today (99.1) and she wishes to hold off on COVID booster vaccination until next week. Intermittent lower abdominal cramp-like pain, which is related to bowel movements.  Her bladder is functioning without hematuria or dysuria but she has noticed an increase in urgency. She has classes for 4 hours and holds her bladder during this time. Additional ROS systems negative.  She also reports having challenges emotionally with  the fear of recurrence. She thinks of this frequently. She would like to talk with a therapist who works with cancer patients about these feelings.     Current Meds:  Outpatient Encounter Medications as of 06/15/2021  Medication Sig   acetaminophen (TYLENOL) 500 MG tablet Take 2 tablets (1,000 mg total) by mouth every 6 (six) hours as needed for moderate pain or fever. Avoid tylenol until your liver function back to normal   Cholecalciferol (VITAMIN D3) 125 MCG (5000 UT) CAPS Take by mouth.   diclofenac Sodium (VOLTAREN) 1 % GEL Apply 4 grams topically 4  times daily as needed.   diphenoxylate-atropine (LOMOTIL) 2.5-0.025 MG tablet TAKE 1 TO 2 TABLETS BY MOUTH FOUR TIMES DAILY AS NEEDED FOR DIARRHEA (Patient taking differently: Take 1-2 tablets by mouth 4 (four) times daily as needed for diarrhea or loose stools. TAKE 1 TO 2 TABLETS BY MOUTH FOUR TIMES DAILY AS NEEDED FOR DIARRHEA)   doxylamine, Sleep, (UNISOM) 25 MG tablet Take 25 mg by mouth at bedtime as needed for sleep.   gabapentin (NEURONTIN) 300 MG capsule TAKE 1 CAPSULE BY MOUTH ONCE A DAY   gabapentin (NEURONTIN) 400 MG capsule Take 1 capsule (400 mg total) by mouth at bedtime.   Glucosamine 750 MG TABS Take 750 mg by mouth in the morning and at bedtime.   letrozole (FEMARA) 2.5 MG tablet Take 1 tablet (2.5 mg total) by mouth daily.   lisinopril (ZESTRIL) 20 MG tablet TAKE 1/2 TABLET BY MOUTH ONCE A DAY   loratadine (CLARITIN) 10 MG tablet Take 10 mg by mouth daily.   LORazepam (ATIVAN) 1 MG tablet Take 1 tablet by mouth 3 times daily as needed for anxiety.   magnesium oxide (MAG-OX) 400 (241.3 Mg) MG tablet Take 1 tablet (400 mg total) by mouth 2 (two) times daily.   metroNIDAZOLE (FLAGYL) 500 MG tablet Take 1 tablet by mouth 2 times daily **do not drink alcohol while taking**   Multiple Vitamin (MULTIVITAMIN WITH MINERALS) TABS tablet Take 1 tablet by mouth daily.   nicotine (NICODERM CQ) 21 mg/24hr patch Place 1 patch (21 mg total)  onto the skin daily.   OLANZapine (ZYPREXA) 7.5 MG tablet Take 1 tablet (7.5 mg total) by mouth at bedtime.   omeprazole (PRILOSEC) 40 MG capsule TAKE 1 CAPSULE BY MOUTH EVERY MORNING AND EVERY NIGHT AT BEDTIME   promethazine (PHENERGAN) 25 MG tablet TAKE 1 TABLET BY MOUTH EVERY 6 HOURS AS NEEDED FOR NAUSEA   rivaroxaban (XARELTO) 20 MG TABS tablet TAKE 1 TABLET BY MOUTH DAILY WITH SUPPER.   rosuvastatin (CRESTOR) 10 MG tablet TAKE 1 TABLET BY MOUTH ONCE A DAY   senna-docusate (SENOKOT-S) 8.6-50 MG tablet Take 2 tablets by mouth at bedtime. For AFTER surgery only, do not take if having diarrhea   Turmeric 450 MG CAPS Take 450 mg by mouth in the morning and at bedtime.   venlafaxine XR (EFFEXOR-XR) 37.5 MG 24 hr capsule TAKE 3 CAPSULES BY MOUTH ONCE A DAY   [DISCONTINUED] lidocaine-prilocaine (EMLA) cream Apply to affected area once (Patient not taking: No sig reported)   [DISCONTINUED] prochlorperazine (COMPAZINE) 10 MG tablet TAKE 1 TABLET BY MOUTH EVERY 6 HOURS AS NEEDED FOR NAUSEA OR VOMITING (Patient taking differently: Take 10  mg by mouth every 6 (six) hours as needed for nausea or vomiting. )   [DISCONTINUED] traMADol (ULTRAM) 50 MG tablet Take 1 tablet (50 mg total) by mouth every 6 (six) hours as needed for severe pain. For AFTER surgery only, do not take and drive (Patient not taking: No sig reported)   Facility-Administered Encounter Medications as of 06/15/2021  Medication   LORazepam (ATIVAN) injection 0.5 mg    Allergy:  Allergies  Allergen Reactions   Dilaudid [Hydromorphone Hcl] Other (See Comments)    Pt became confused, pulled out iv's, does not remember anything   Depakote [Divalproex Sodium] Nausea And Vomiting   Minocycline Hives   Aspirin Hives    States able to tolerate Goody Powders without any problem except for GI upset    Social Hx:   Social History   Socioeconomic History   Marital status: Single    Spouse name: Not on file   Number of children: Not on  file   Years of education: Not on file   Highest education level: Not on file  Occupational History   Not on file  Tobacco Use   Smoking status: Every Day    Packs/day: 0.50    Years: 22.00    Pack years: 11.00    Types: Cigarettes   Smokeless tobacco: Never   Tobacco comments:    Recently started a smoking cessation class.   Vaping Use   Vaping Use: Never used  Substance and Sexual Activity   Alcohol use: Not Currently    Alcohol/week: 0.0 standard drinks    Comment:  hx alcohollism  in remission since 2014   Drug use: Yes    Types: Marijuana    Comment: occas.   Sexual activity: Yes    Partners: Male    Birth control/protection: Surgical  Other Topics Concern   Not on file  Social History Narrative   Former [facility] patient.      Was on disability at one point.   Return to the workforce.  40 hours a week at [employer], 10 hours a week on the weekends at [employer] in [place] at night 10 to 12 hours a week.      Has grown children, she lives alone with a pet, continues to smoke no alcohol or drug use at this time      History of EtOH abuse and THC use.         Social Determinants of Health   Financial Resource Strain: Not on file  Food Insecurity: Not on file  Transportation Needs: Not on file  Physical Activity: Not on file  Stress: Not on file  Social Connections: Not on file  Intimate Partner Violence: Not on file    Past Surgical Hx:  Past Surgical History:  Procedure Laterality Date   CESAREAN SECTION  1995   w/  Bilateral Tubal Ligation   COLONOSCOPY  last one 08-09-2013   CYSTOSCOPY W/ URETERAL STENT PLACEMENT Left 03/29/2016   Procedure: CYSTOSCOPY WITH STENT REPLACEMENT;  Surgeon: Nickie Retort, MD;  Location: [facility];  Service: Urology;  Laterality: Left;   CYSTOSCOPY WITH RETROGRADE PYELOGRAM, URETEROSCOPY AND STENT PLACEMENT Left 03/08/2016   Procedure: CYSTOSCOPY WITH  LEFT RETROGRADE PYELOGRAM,  AND STENT PLACEMENT;  Surgeon: Nickie Retort, MD;  Location: [facility];  Service: Urology;  Laterality: Left;   CYSTOSCOPY/RETROGRADE/URETEROSCOPY/STONE EXTRACTION WITH BASKET Left 03/29/2016   Procedure: CYSTOSCOPY/RETROGRADE/URETEROSCOPY/STONE EXTRACTION WITH BASKET;  Surgeon: Nickie Retort, MD;  Location: [place];  Service: Urology;  Laterality: Left;   ENDOMETRIAL ABLATION W/ NOVASURE  04-01-2010   ESOPHAGOGASTRODUODENOSCOPY  last one 08-09-2013   KNEE ARTHROSCOPY Left as teen   LASER ABLATION OF THE CERVIX  2012 approx   MASTECTOMY WITH AXILLARY LYMPH NODE DISSECTION Left 07/27/2020   Procedure: LEFT MASTECTOMY WITH LEFT RADIOACTIVE SEED GUIDED TARGETED AXILLARY LYMPH NODE DISSECTION;  Surgeon: Alphonsa Overall, MD;  Location: [place];  Service: General;  Laterality: Left;   PORT-A-CATH REMOVAL Right 05/18/2021   Procedure: REMOVAL PORT-A-CATH;  Surgeon: Donnie Mesa, MD;  Location: [facility];  Service: General;  Laterality: Right;   PORTACATH PLACEMENT Right 03/06/2020   Procedure: INSERTION PORT-A-CATH WITH ULTRASOUND GUIDANCE;  Surgeon: Alphonsa Overall, MD;  Location: [place];  Service: General;  Laterality: Right;   ROBOTIC ASSISTED TOTAL HYSTERECTOMY WITH BILATERAL SALPINGO OOPHERECTOMY N/A 05/18/2021   Procedure: XI ROBOTIC ASSISTED TOTAL HYSTERECTOMY WITH BILATERAL SALPINGO OOPHORECTOMY;  Surgeon: Everitt Amber, MD;  Location: [facility];  Service: Gynecology;  Laterality: N/A;   TRANSTHORACIC ECHOCARDIOGRAM  05-19-2006   lvsf normal, ef 55-65%, there was mild flattening of the interventricular septum during diastoli/  RV size at upper limits normal   TUBAL LIGATION     VULVA /PERINEUM BIOPSY N/A 05/18/2021   Procedure: VULVAR BIOPSY;  Surgeon: Everitt Amber, MD;  Location: [facility];  Service: Gynecology;  Laterality: N/A;   WISDOM TOOTH EXTRACTION  age 23 's    Past Medical Hx:  Past Medical History:  Diagnosis Date   Arthritis    Bipolar 1 disorder ([place])     Cancer ([place])    Complication of anesthesia    wakes up during procedures   Depression    GAD (generalized anxiety disorder)    Genital HSV    currently per pt  no break out 03-22-2016    GERD (gastroesophageal reflux disease)    Hiatal hernia    History of cervical dysplasia    2012 laser ablation   History of esophageal dilatation    for dysphasia -- x2 dilated   History of gastric ulcer    History of Helicobacter pylori infection    remote hx   History of hidradenitis suppurativa    "gets all over body intermittantly"     History of hypertension    no issue since stopped drinking alcohol 2014   History of kidney stones    History of panic attacks    History of radiation therapy 03/16/2020-05/08/2020   vulva  Dr Gery Pray   History of radiation therapy 09/09/2020-10/23/2020   left chest wall/left SCV   Dr Gery Pray   Hypertension    Iron deficiency anemia    Left ureteral stone    OCD (obsessive compulsive disorder)    PONV (postoperative nausea and vomiting)    Pre-diabetes    PTSD (post-traumatic stress disorder)    Recovering alcoholic in remission ([place])    since 2014   RLS (restless legs syndrome)    Smokers' cough ([place])    Urgency of urination    Yeast infection involving the vagina and surrounding area    secondary to taking antibiotic    Past Gynecological History:  See HPI, hx of cesarean sections No LMP recorded. Patient has had an ablation.  Family Hx:  Family History  Problem Relation Age of Onset   Heart disease Father    Lung cancer Father        d. 10   Alcohol abuse  Father    Heart disease Mother    Depression Mother    Anxiety disorder Mother    Drug abuse Brother    Alcohol abuse Brother    Drug abuse Brother    ADD / ADHD Brother    Colon polyps Brother    Cancer Paternal Grandfather        "stomach"   Diabetes Maternal Grandfather    Diabetes Paternal Grandmother    Kidney disease Maternal Uncle    Cirrhosis Cousin         alcoholic   Anxiety disorder Maternal Aunt    Depression Maternal Aunt    Cancer Cousin        maternal; ovarian cancer or other "female" cancer?   Lung cancer Paternal Uncle 35   Throat cancer Cousin        paternal; dx 90s   Lung cancer Cousin        paternal; dx 45s    Vitals:  Blood pressure 124/84, pulse 100, temperature 98.8 F (37.1 C), temperature source Oral, resp. rate 20, height 5' 5.5" (1.664 m), weight 192 lb (87.1 kg), SpO2 100 %.  Physical Exam: WD in NAD. Alert, oriented, in no acute distress.  Cardiovascular: Well perfused peripheries Lungs: No increased WOB Skin: No visible rash/lesions/breakdown  Genito-Urinary: Vulva/vagina: Smooth vaginal cuff, intact, no bleeding. Off white discharge noted in the vagina and at the cuff. Sample taken for wet prep.  Vulva: radiation changes, no visible tumor.  Extremities: No bilateral cyanosis, clubbing or edema.   Dorothyann Gibbs, NP  06/16/2021, 12:32 PM

## 2021-06-21 ENCOUNTER — Other Ambulatory Visit (HOSPITAL_COMMUNITY): Payer: Self-pay

## 2021-06-22 ENCOUNTER — Encounter: Payer: Self-pay | Admitting: Hematology and Oncology

## 2021-06-22 ENCOUNTER — Inpatient Hospital Stay: Payer: PRIVATE HEALTH INSURANCE

## 2021-06-22 ENCOUNTER — Encounter: Payer: Self-pay | Admitting: *Deleted

## 2021-06-22 NOTE — Progress Notes (Signed)
CSW received referral for counseling resources and support. CSW attempted to contact patient-left voicemail to return call.  Maryjean Morn, MSW, LCSW, OSW-C Clinical Social Worker [facility] [phone]

## 2021-06-23 NOTE — Progress Notes (Incomplete)
Radiation Oncology         [phone]) [phone] ________________________________  Name: Sandra Miller MRN: [id number]  Date: 06/24/2021  DOB: 1976/01/28  Follow-Up Visit Note  CC: Nicholas Lose, MD  Just, Laurita Quint, FNP  No diagnosis found.  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   Interval Since Last Radiation: 8 months and 2 days    Radiation Treatment Dates: 09/09/2020 through 10/23/2020   Site: Left chest wall Technique: 3D Total Dose (Gy): 50/50 Dose per Fx (Gy): 2 Completed Fx: 25/25 Beam Energies: 6X, 10X   Site: Left SCV Technique: 3D Total Dose (Gy): 50/50 Dose per Fx (Gy): 2 Completed Fx: 25/25 Beam Energies: 6X, 10X   Site: Left chest wall boost Technique: Electron Total Dose (Gy): 10/10 Dose per Fx (Gy): 2 Completed Fx: 5/5 Beam Energies: 6E   S/p primary radiation completed 05/08/20 with external beam to the pelvis (45 Gy) and boost to the vulva (16.2 Gy).  Narrative:  The patient returns today for routine follow-up, she was last seen here for follow-up on 11/23/20. Since her last visit, the patient has maintained close follow-up with Dr. Lindi Adie and has continued on Kadcyla maintenance ending on 04/06/21. (The patient additionally began anastrozole, 1 mg daily, on 12/01/20 which was switched to letrozole on 02/23/21).        To review: transvaginal ultrasound performed on 11/06/20 demonstrated the normal appearance of the uterus, endometrium, and bilateral ovaries.             During follow-up with Dr. Denman George on 02/19/21, the patient expressed interest in pursuing hysterectomy in addition to BSO. Following discussion of risks, the patient was scheduled  and cleared to undergo hysterectomy and BSO.   Hysterectomy and BSO performed on 05/18/21 revealed:  uterus with leiomyoma measuring 0.8 cm, and benign endometrium, cervix, and bilateral fallopian tubes. No evidence of malignancy was found overall from hysterectomy and BSO. Biopsy of perineal mass was also performed at this time which revealed low-grade squamous intraepithelial lesion (AIN1, low grade dysplasia). Peritoneal washing performed revealed reactive mesothelial cells present.   The patient again followed up with Dr. Denman George on 05/31/21. During which time, the patient was noted to be doing well with no major complaints post-op.  In recent history, the patient presented to Joylene John NP on 06/15/21 with malodorous vaginal discharge.  A wet prep was performed revealing bacterial vaginosis. Oral flagyl was prescribed and the patient was instructed to call back with any concerns.  Of note: echocardiogram performed on 03/10/21 demonstrated an estimated left ventricular EF of 55 - 60%. (Echo on 11/18/20: EF of 60 - 65%)               Allergies:  is allergic to dilaudid [hydromorphone hcl], depakote [divalproex sodium], minocycline, and aspirin.  Meds: Current Outpatient Medications  Medication Sig Dispense Refill   acetaminophen (TYLENOL) 500 MG tablet Take 2 tablets (1,000 mg total) by mouth every 6 (six) hours as needed for moderate pain or fever. Avoid tylenol until your liver function back to normal 30 tablet 0   Cholecalciferol (VITAMIN D3) 125 MCG (5000 UT) CAPS Take by mouth.     diclofenac Sodium (VOLTAREN) 1 % GEL  Apply 4 grams topically 4  times daily as needed. 500 g 6   diphenoxylate-atropine (LOMOTIL) 2.5-0.025 MG tablet TAKE 1 TO 2 TABLETS BY MOUTH FOUR TIMES DAILY AS NEEDED FOR DIARRHEA (Patient taking differently: Take 1-2 tablets by mouth 4 (four) times daily as needed for diarrhea or loose stools. TAKE 1 TO 2 TABLETS BY MOUTH FOUR TIMES DAILY AS NEEDED FOR DIARRHEA) 45 tablet  1   doxylamine, Sleep, (UNISOM) 25 MG tablet Take 25 mg by mouth at bedtime as needed for sleep.     gabapentin (NEURONTIN) 300 MG capsule TAKE 1 CAPSULE BY MOUTH ONCE A DAY 30 capsule 5   gabapentin (NEURONTIN) 400 MG capsule Take 1 capsule (400 mg total) by mouth at bedtime. 30 capsule 0   Glucosamine 750 MG TABS Take 750 mg by mouth in the morning and at bedtime.     letrozole (FEMARA) 2.5 MG tablet Take 1 tablet (2.5 mg total) by mouth daily. 90 tablet 3   lisinopril (ZESTRIL) 20 MG tablet TAKE 1/2 TABLET BY MOUTH ONCE A DAY 90 tablet 3   loratadine (CLARITIN) 10 MG tablet Take 10 mg by mouth daily.     LORazepam (ATIVAN) 1 MG tablet Take 1 tablet by mouth 3 times daily as needed for anxiety. 90 tablet 2   magnesium oxide (MAG-OX) 400 (241.3 Mg) MG tablet Take 1 tablet (400 mg total) by mouth 2 (two) times daily. 60 tablet 1   metroNIDAZOLE (FLAGYL) 500 MG tablet Take 1 tablet by mouth 2 times daily **do not drink alcohol while taking** 14 tablet 0   Multiple Vitamin (MULTIVITAMIN WITH MINERALS) TABS tablet Take 1 tablet by mouth daily.     nicotine (NICODERM CQ) 21 mg/24hr patch Place 1 patch (21 mg total) onto the skin daily. 28 patch 0   OLANZapine (ZYPREXA) 7.5 MG tablet Take 1 tablet (7.5 mg total) by mouth at bedtime. 30 tablet 2   omeprazole (PRILOSEC) 40 MG capsule TAKE 1 CAPSULE BY MOUTH EVERY MORNING AND EVERY NIGHT AT BEDTIME 60 capsule 3   promethazine (PHENERGAN) 25 MG tablet TAKE 1 TABLET BY MOUTH EVERY 6 HOURS AS NEEDED FOR NAUSEA 30 tablet 0   rivaroxaban (XARELTO) 20 MG TABS tablet TAKE 1 TABLET BY MOUTH DAILY WITH SUPPER. 30 tablet 6   rosuvastatin (CRESTOR) 10 MG tablet TAKE 1 TABLET BY MOUTH ONCE A DAY 90 tablet 3   senna-docusate (SENOKOT-S) 8.6-50 MG tablet Take 2 tablets by mouth at bedtime. For AFTER surgery only, do not take if having diarrhea 30 tablet 0   Turmeric 450 MG CAPS Take 450 mg by mouth in the morning and at bedtime.     venlafaxine XR (EFFEXOR-XR) 37.5 MG  24 hr capsule TAKE 3 CAPSULES BY MOUTH ONCE A DAY 90 capsule 2   No current facility-administered medications for this encounter.   Facility-Administered Medications Ordered in Other Encounters  Medication Dose Route Frequency Provider Last Rate Last Admin   LORazepam (ATIVAN) injection 0.5 mg  0.5 mg Intravenous Once Harle Stanford., PA-C        Physical Findings: The patient is in no acute distress. Patient is alert and oriented.  vitals were not taken for this visit. .  No significant changes. Lungs are clear to auscultation bilaterally. Heart has regular rate and rhythm. No palpable cervical, supraclavicular, or axillary adenopathy. Abdomen soft, non-tender, normal bowel sounds. ***   Lab Findings: Lab Results  Component Value Date   WBC 8.0 05/06/2021   HGB  12.0 05/06/2021   HCT 36.6 05/06/2021   MCV 90.1 05/06/2021   PLT 287 05/06/2021    Radiographic Findings: No results found.  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   The patient is recovering from the effects of radiation.  ***  Plan:  ***   *** minutes of total time was spent for this patient encounter, including preparation, face-to-face counseling with the patient and coordination of care, physical exam, and documentation of the encounter. ____________________________________  Blair Promise, PhD, MD   This document serves as a record of services personally performed by Gery Pray, MD. It was created on his behalf by Roney Mans, a trained medical scribe. The creation of this record is based on the scribe's personal observations and the provider's statements to them. This document has been  checked and approved by the attending provider.

## 2021-06-24 ENCOUNTER — Telehealth: Payer: Self-pay | Admitting: *Deleted

## 2021-06-24 ENCOUNTER — Ambulatory Visit
Admission: RE | Admit: 2021-06-24 | Discharge: 2021-06-24 | Disposition: A | Discharge status: Home / Self Care | Payer: Medicaid Other | Source: Ambulatory Visit | Attending: Radiation Oncology | Admitting: Radiation Oncology

## 2021-06-24 NOTE — Telephone Encounter (Signed)
CALLED PATIENT TO ASK ABOUT RESCHEDULING FU APPT. FOR 06-24-21, PATIENT AGREED TO COME ON 07-01-21 @ 8 AM

## 2021-06-29 ENCOUNTER — Telehealth: Payer: Self-pay | Admitting: *Deleted

## 2021-06-29 NOTE — Telephone Encounter (Signed)
LVM for patient to follow up on her referral to the [facility] Neuropathy Study.  Asked patient to please return call to discuss further. Also informed patient we can meet in person when she comes in for her RadOnc appointment on Thursday 10/27 if she is interested.  Thanked patient for her time.  Foye Spurling, BSN, RN, [employer] Clinical Research Nurse 06/29/2021 3:35 PM

## 2021-06-30 NOTE — Progress Notes (Signed)
Radiation Oncology         [phone]) [phone] ________________________________  Name: Sandra Miller MRN: [id number]  Date: 07/01/2021  DOB: 08/04/1976  Follow-Up Visit Note  CC: Nicholas Lose, MD  Just, Laurita Quint, FNP    ICD-10-CM   1. Vulvar cancer ([place])  C51.9 CBC with Differential ([place])    2. Malignant neoplasm of upper-inner quadrant of left breast in female, estrogen receptor positive ([place])  C50.212 CBC with Differential (Cancer Center Only)   Z17.0       Diagnosis: Stage IB (ypT2, ypN1a) Multicentric Left Breast, Invasive Ductal Carcinoma with DCIS, ER+ / PR+ / Her2+, Grade 3   Stage IB vulvar cancer s/p definitive radiation (inoperable due to the close proximity to the anal sphincter), s/p diagnosis of HR positive left breast cancer, and deleterious mutation in BRIP1 (increased risk for ovarian cancer).   [Interval since last radiation therapy for vulvar malignancy; 1 year and 2 months]  S/p primary radiation completed 05/08/20 with external beam to the pelvis (45 Gy) and boost to the vulva (16.2 Gy).  Interval Since Last Radiation: 8 months and 9 days for left chest wall treatment    Radiation Treatment Dates: 09/09/2020 through 10/23/2020   Site: Left chest wall Technique: 3D Total Dose (Gy): 50/50 Dose per Fx (Gy): 2 Completed Fx: 25/25 Beam Energies: 6X, 10X   Site: Left SCV Technique: 3D Total Dose (Gy): 50/50 Dose per Fx (Gy): 2 Completed Fx: 25/25 Beam Energies: 6X, 10X   Site: Left chest wall boost Technique: Electron Total Dose (Gy): 10/10 Dose per Fx (Gy): 2 Completed Fx: 5/5 Beam Energies: 6E     Narrative:  The patient returns today for routine follow-up, she was last seen here for follow-up on 11/23/20. Since her last visit, the patient has maintained close follow-up with Dr. Lindi Adie and has continued on Kadcyla maintenance ending on 04/06/21. (The patient additionally began anastrozole, 1 mg daily, on 12/01/20 which was switched to  letrozole on 02/23/21).        To review: transvaginal ultrasound performed on 11/06/20 demonstrated the normal appearance of the uterus, endometrium, and bilateral ovaries.             During follow-up with Dr. Denman George on 02/19/21, the patient expressed interest in pursuing hysterectomy in addition to BSO. Following discussion of risks, the patient was scheduled and cleared to undergo hysterectomy and BSO.   Hysterectomy and BSO performed on 05/18/21 revealed:  uterus with leiomyoma measuring 0.8 cm, and benign endometrium, cervix, and bilateral fallopian tubes. No evidence of malignancy was found overall from hysterectomy and BSO. Biopsy of perineal mass was also performed at this time which revealed low-grade squamous intraepithelial lesion (AIN1, low grade dysplasia). Peritoneal washing performed revealed reactive mesothelial cells present.   The patient again followed up with Dr. Denman George on 05/31/21. During which time, the patient was noted to be doing well with no major complaints post-op.  In recent history, the patient presented to [facility] NP on 06/15/21 with malodorous vaginal discharge.  A wet prep was performed revealing bacterial vaginosis. Oral flagyl was prescribed and the patient was instructed to call back with any concerns.  Of note: echocardiogram performed on 03/10/21 demonstrated an estimated left ventricular EF of 55 - 60%. (Echo on 11/18/20: EF of 60 - 65%)  The patient continues to complain of significant pain within the pelvis region.  She describes this pain is deep in the central pelvis. she denies any vaginal bleeding but  does report a vaginal discharge possibly improved with her Flagyl prescription recently.  She denies any hematuria or rectal bleeding.               Allergies:  is allergic to dilaudid [hydromorphone hcl], depakote [divalproex sodium], minocycline, and aspirin.  Meds: Current Outpatient Medications  Medication Sig Dispense Refill   acetaminophen  (TYLENOL) 500 MG tablet Take 2 tablets (1,000 mg total) by mouth every 6 (six) hours as needed for moderate pain or fever. Avoid tylenol until your liver function back to normal 30 tablet 0   Cholecalciferol (VITAMIN D3) 125 MCG (5000 UT) CAPS Take by mouth.     diclofenac Sodium (VOLTAREN) 1 % GEL Apply 4 grams topically 4  times daily as needed. 500 g 6   diphenoxylate-atropine (LOMOTIL) 2.5-0.025 MG tablet TAKE 1 TO 2 TABLETS BY MOUTH FOUR TIMES DAILY AS NEEDED FOR DIARRHEA (Patient taking differently: Take 1-2 tablets by mouth 4 (four) times daily as needed for diarrhea or loose stools. TAKE 1 TO 2 TABLETS BY MOUTH FOUR TIMES DAILY AS NEEDED FOR DIARRHEA) 45 tablet 1   doxylamine, Sleep, (UNISOM) 25 MG tablet Take 25 mg by mouth at bedtime as needed for sleep.     gabapentin (NEURONTIN) 300 MG capsule TAKE 1 CAPSULE BY MOUTH ONCE A DAY 30 capsule 5   gabapentin (NEURONTIN) 400 MG capsule Take 1 capsule (400 mg total) by mouth at bedtime. 30 capsule 0   Glucosamine 750 MG TABS Take 750 mg by mouth in the morning and at bedtime.     letrozole (FEMARA) 2.5 MG tablet Take 1 tablet (2.5 mg total) by mouth daily. 90 tablet 3   lisinopril (ZESTRIL) 20 MG tablet TAKE 1/2 TABLET BY MOUTH ONCE A DAY 90 tablet 3   loratadine (CLARITIN) 10 MG tablet Take 10 mg by mouth daily.     LORazepam (ATIVAN) 1 MG tablet Take 1 tablet by mouth 3 times daily as needed for anxiety. 90 tablet 2   magnesium oxide (MAG-OX) 400 (241.3 Mg) MG tablet Take 1 tablet (400 mg total) by mouth 2 (two) times daily. 60 tablet 1   Multiple Vitamin (MULTIVITAMIN WITH MINERALS) TABS tablet Take 1 tablet by mouth daily.     nicotine (NICODERM CQ) 21 mg/24hr patch Place 1 patch (21 mg total) onto the skin daily. 28 patch 0   OLANZapine (ZYPREXA) 7.5 MG tablet Take 1 tablet (7.5 mg total) by mouth at bedtime. 30 tablet 2   omeprazole (PRILOSEC) 40 MG capsule TAKE 1 CAPSULE BY MOUTH EVERY MORNING AND EVERY NIGHT AT BEDTIME 60 capsule 3    promethazine (PHENERGAN) 25 MG tablet TAKE 1 TABLET BY MOUTH EVERY 6 HOURS AS NEEDED FOR NAUSEA 30 tablet 0   rivaroxaban (XARELTO) 20 MG TABS tablet TAKE 1 TABLET BY MOUTH DAILY WITH SUPPER. 30 tablet 6   rosuvastatin (CRESTOR) 10 MG tablet TAKE 1 TABLET BY MOUTH ONCE A DAY 90 tablet 3   senna-docusate (SENOKOT-S) 8.6-50 MG tablet Take 2 tablets by mouth at bedtime. For AFTER surgery only, do not take if having diarrhea 30 tablet 0   Turmeric 450 MG CAPS Take 450 mg by mouth in the morning and at bedtime.     venlafaxine XR (EFFEXOR-XR) 37.5 MG 24 hr capsule TAKE 3 CAPSULES BY MOUTH ONCE A DAY 90 capsule 2   No current facility-administered medications for this encounter.   Facility-Administered Medications Ordered in Other Encounters  Medication Dose Route Frequency Provider Last Rate Last  Admin   LORazepam (ATIVAN) injection 0.5 mg  0.5 mg Intravenous Once Harle Stanford., PA-C        Physical Findings: The patient is in no acute distress. Patient is alert and oriented.  height is 5' 5.5" (1.664 m) and weight is 190 lb 2 oz (86.2 kg). Her temporal temperature is 96.8 F (36 C) (abnormal). Her blood pressure is 113/78 and her pulse is 114 (abnormal). Her respiration is 18 and oxygen saturation is 100%. .   Lungs are clear to auscultation bilaterally. Heart has regular rate and rhythm. No palpable cervical, supraclavicular, or axillary adenopathy. Abdomen soft, non-tender, normal bowel sounds.  Examination of the left chest wall area reveals some hyperpigmentation changes.  No palpable or visible signs of recurrence.  Examination of the right breast area reveals no palpable mass nipple discharge or bleeding.  On pelvic examination the external genitalia unremarkable.  There are no lesions along the vulvar or perianal area to suggest recurrence.  A speculum exam is performed.  There are no mucosal lesions noted in the vaginal vault.  Vaginal cuff sutures remain palpable.  On bimanual and rectovaginal  examination there are no pelvic masses appreciated.   Lab Findings: Lab Results  Component Value Date   WBC 7.1 07/01/2021   HGB 11.3 (L) 07/01/2021   HCT 34.3 (L) 07/01/2021   MCV 87.3 07/01/2021   PLT 296 07/01/2021    Radiographic Findings: No results found.  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   The patient reports pain deep within the central pelvis area which is quite bothersome to her.  Will discuss with GYN whether to proceed with a pelvic ultrasound or CT for further evaluation.  Blood work earlier today shows no suspicious findings to suggest infection.  Patient does have vaginal dilator but has been instructed not to use this until she is 8 weeks out from her most recent hysterectomy surgery.  Plan: The patient will follow up with Dr. Berline Lopes in late December.  Routine follow-up in radiation oncology in 3 months.   25 minutes of total time was spent for this patient encounter, including preparation, face-to-face counseling with the patient and coordination of care, physical exam, and documentation of the encounter. ____________________________________  Blair Promise, PhD, MD   This document serves as a record of services personally performed by Gery Pray, MD. It was created on his behalf by Roney Mans, a trained medical scribe. The creation of this record is based on the scribe's personal observations and the provider's statements to them. This document has been checked and approved by the attending provider.

## 2021-07-01 ENCOUNTER — Encounter: Payer: Self-pay | Admitting: *Deleted

## 2021-07-01 ENCOUNTER — Ambulatory Visit
Admission: RE | Admit: 2021-07-01 | Discharge: 2021-07-01 | Disposition: A | Discharge status: Home / Self Care | Payer: PRIVATE HEALTH INSURANCE | Source: Ambulatory Visit | Attending: Radiation Oncology | Admitting: Radiation Oncology

## 2021-07-01 ENCOUNTER — Inpatient Hospital Stay (HOSPITAL_BASED_OUTPATIENT_CLINIC_OR_DEPARTMENT_OTHER): Payer: PRIVATE HEALTH INSURANCE | Admitting: *Deleted

## 2021-07-01 ENCOUNTER — Inpatient Hospital Stay: Payer: PRIVATE HEALTH INSURANCE

## 2021-07-01 ENCOUNTER — Other Ambulatory Visit: Payer: Self-pay

## 2021-07-01 ENCOUNTER — Ambulatory Visit: Payer: PRIVATE HEALTH INSURANCE

## 2021-07-01 VITALS — BP 113/78 | HR 114 | Temp 96.8°F | Resp 18 | Ht 65.5 in | Wt 190.1 lb

## 2021-07-01 DIAGNOSIS — C519 Malignant neoplasm of vulva, unspecified: Secondary | ICD-10-CM

## 2021-07-01 DIAGNOSIS — Z923 Personal history of irradiation: Secondary | ICD-10-CM | POA: Diagnosis not present

## 2021-07-01 DIAGNOSIS — Z170 Estrogen receptor positive status [ER+]: Secondary | ICD-10-CM | POA: Diagnosis not present

## 2021-07-01 DIAGNOSIS — Z7901 Long term (current) use of anticoagulants: Secondary | ICD-10-CM | POA: Diagnosis not present

## 2021-07-01 DIAGNOSIS — Z79811 Long term (current) use of aromatase inhibitors: Secondary | ICD-10-CM | POA: Diagnosis not present

## 2021-07-01 DIAGNOSIS — C50212 Malignant neoplasm of upper-inner quadrant of left female breast: Secondary | ICD-10-CM

## 2021-07-01 LAB — CBC WITH DIFFERENTIAL (CANCER CENTER ONLY)
Abs Immature Granulocytes: 0.02 10*3/uL (ref 0.00–0.07)
Basophils Absolute: 0 10*3/uL (ref 0.0–0.1)
Basophils Relative: 0 %
Eosinophils Absolute: 0.1 10*3/uL (ref 0.0–0.5)
Eosinophils Relative: 1 %
HCT: 34.3 % — ABNORMAL LOW (ref 36.0–46.0)
Hemoglobin: 11.3 g/dL — ABNORMAL LOW (ref 12.0–15.0)
Immature Granulocytes: 0 %
Lymphocytes Relative: 24 %
Lymphs Abs: 1.7 10*3/uL (ref 0.7–4.0)
MCH: 28.8 pg (ref 26.0–34.0)
MCHC: 32.9 g/dL (ref 30.0–36.0)
MCV: 87.3 fL (ref 80.0–100.0)
Monocytes Absolute: 0.8 10*3/uL (ref 0.1–1.0)
Monocytes Relative: 11 %
Neutro Abs: 4.5 10*3/uL (ref 1.7–7.7)
Neutrophils Relative %: 64 %
Platelet Count: 296 10*3/uL (ref 150–400)
RBC: 3.93 MIL/uL (ref 3.87–5.11)
RDW: 13.4 % (ref 11.5–15.5)
WBC Count: 7.1 10*3/uL (ref 4.0–10.5)
nRBC: 0 % (ref 0.0–0.2)

## 2021-07-01 LAB — COMPREHENSIVE METABOLIC PANEL
ALT: 31 U/L (ref 0–44)
AST: 27 U/L (ref 15–41)
Albumin: 3.9 g/dL (ref 3.5–5.0)
Alkaline Phosphatase: 64 U/L (ref 38–126)
Anion gap: 11 (ref 5–15)
BUN: 14 mg/dL (ref 6–20)
CO2: 24 mmol/L (ref 22–32)
Calcium: 10.6 mg/dL — ABNORMAL HIGH (ref 8.9–10.3)
Chloride: 104 mmol/L (ref 98–111)
Creatinine, Ser: 0.67 mg/dL (ref 0.44–1.00)
GFR, Estimated: >60 mL/min (ref >60)
Glucose, Bld: 102 mg/dL — ABNORMAL HIGH (ref 70–99)
Potassium: 3.9 mmol/L (ref 3.5–5.1)
Sodium: 139 mmol/L (ref 135–145)
Total Bilirubin: 0.6 mg/dL (ref 0.3–1.2)
Total Protein: 7.6 g/dL (ref 6.5–8.1)

## 2021-07-01 NOTE — Progress Notes (Signed)
Sandra Miller is here today for follow up post radiation to the breast.   Breast Side:Left,   They completed their radiation on: 10/23/20  Does the patient complain of any of the following: Post radiation skin issues: Skin remains intact Breast Tenderness: Patient reports having some tenderness under left arm.  Breast Swelling: no Lymphadema: no Range of Motion limitations: Full range of motion. Fatigue post radiation: Patient reports having a low energy level. Appetite good/fair/poor: Good   Additional comments if applicable: Patient reports having pain to pelvic area.  Rates pain 8/10. Reports taking Tylenol, which is not effective.  Patient recently underwent a hysterectomy. Patient reports having a vaginal discharge, was prescribed metronidazole. Patient reports that medication was not effective. Continues to have vaginal discharge. Reports feeling nauseated all the time.   Vitals:   07/01/21 0801  BP: 113/78  Pulse: (!) 114  Resp: 18  Temp: (!) 96.8 F (36 C)  TempSrc: Temporal  SpO2: 100%  Weight: 190 lb 2 oz (86.2 kg)  Height: 5' 5.5" (1.664 m)

## 2021-07-01 NOTE — Progress Notes (Signed)
  Survivorship Care Plan assessment reviewed and completed. SDOH reviewed and completed . Pt is still struggling with feelings of anxiety worried about when will the cancer come back. She is working now and taking some courses but her mind wonders back and forth about the cancer. She will be seeing a counselor near Nov.1st to help her with achieving resolution in this area. We talked in depth today and pt said," it helped a lot, glad she came". Pt recently increased Gabapentin to 400 mg once a day on 06/14/21. I did refer her to [facility] for a primary care doctor.

## 2021-07-01 NOTE — Progress Notes (Signed)
[id number] - TREATMENT OF ESTABLISHED CHEMOTHERAPY-INDUCED NEUROPATHY WITH N-PALMITOYLETHANOLAMIDE, A CANNABIMIMETIC NUTRACEUTICAL: A RANDOMIZED DOUBLE-BLIND PHASE II PILOT TRIAL  Notified by [employer], RN that patient is not interested in participating in this study at this time. Patient recently had dose of Gabapentin increased and she wants to see if that will help her neuropathy symptoms. In addition to that, she is concerned about possibly being on placebo on the study.  Dr. Lindi Adie notified.  Foye Spurling, BSN, RN, [employer] Clinical Research Nurse 07/01/2021 11:15 AM

## 2021-07-02 ENCOUNTER — Telehealth: Payer: Self-pay | Admitting: *Deleted

## 2021-07-03 ENCOUNTER — Other Ambulatory Visit (HOSPITAL_COMMUNITY): Payer: Self-pay

## 2021-07-07 ENCOUNTER — Other Ambulatory Visit: Payer: Self-pay

## 2021-07-07 ENCOUNTER — Encounter: Payer: Self-pay | Admitting: Hematology and Oncology

## 2021-07-07 ENCOUNTER — Inpatient Hospital Stay: Payer: PRIVATE HEALTH INSURANCE

## 2021-07-08 ENCOUNTER — Ambulatory Visit: Payer: PRIVATE HEALTH INSURANCE

## 2021-07-09 ENCOUNTER — Encounter: Payer: Self-pay | Admitting: Hematology and Oncology

## 2021-07-09 ENCOUNTER — Other Ambulatory Visit (HOSPITAL_COMMUNITY): Payer: Self-pay

## 2021-07-10 ENCOUNTER — Other Ambulatory Visit (HOSPITAL_COMMUNITY): Payer: Self-pay

## 2021-07-10 ENCOUNTER — Other Ambulatory Visit: Payer: Self-pay | Admitting: Gynecologic Oncology

## 2021-07-12 ENCOUNTER — Encounter: Payer: Self-pay | Admitting: Hematology and Oncology

## 2021-07-12 ENCOUNTER — Other Ambulatory Visit (HOSPITAL_COMMUNITY): Payer: Self-pay

## 2021-07-12 ENCOUNTER — Telehealth: Payer: Self-pay | Admitting: *Deleted

## 2021-07-12 NOTE — Telephone Encounter (Signed)
Received call from pt with complaint of diarrhea, nausea, vomiting, and hot flashes x several weeks.  Pt states she believes symptoms are related to Letrozole.  Per MD pt to stop Letrozole x2 weeks and r/t to clinic to see if symptoms have resolved.  Pt educated to take OTC imodium as directed on the bottle for diarrhea as well as increase fluid intake to help with dehydration.  Pt verbalized understanding and appreciative of advice.

## 2021-07-16 ENCOUNTER — Telehealth: Payer: Self-pay

## 2021-07-16 NOTE — Telephone Encounter (Signed)
Called patient to follow up due to  pelvic pain. Patient states that she continues to have intermittent pelvic pain rating 8/10. Patient reports relief of diarrhea since stopping Letrozole. Patient continues to have vaginal discharge.

## 2021-07-19 ENCOUNTER — Other Ambulatory Visit: Payer: Self-pay | Admitting: Radiation Oncology

## 2021-07-19 DIAGNOSIS — C519 Malignant neoplasm of vulva, unspecified: Secondary | ICD-10-CM

## 2021-07-21 ENCOUNTER — Encounter: Payer: Self-pay | Admitting: Hematology and Oncology

## 2021-07-21 ENCOUNTER — Telehealth: Payer: Self-pay | Admitting: *Deleted

## 2021-07-21 NOTE — Telephone Encounter (Signed)
CALLED PATIENT TO INFORM OF CT FOR 07-28-21- ARRIVAL TIME- 8:45 AM @ [facility],PATIENT TO BE  NPO- 4 HRS. PRIOR TO TEST, PATIENT TO PICK -UP DRINK ON 07-27-21 @ [facility], PATIENT VERIFIED UNDERSTANDING THESE APPTS.

## 2021-07-22 ENCOUNTER — Ambulatory Visit (HOSPITAL_COMMUNITY): Payer: PRIVATE HEALTH INSURANCE

## 2021-07-25 NOTE — Progress Notes (Signed)
Patient Care Team: Nicholas Lose, MD as PCP - General (Hematology and Oncology) Nicholas Lose, MD as Consulting Physician (Hematology and Oncology) Alphonsa Overall, MD as Consulting Physician (General Surgery) Awanda Mink Craige Cotta, RN as Oncology Nurse Navigator (Oncology) Everitt Amber, MD as Consulting Physician (Gynecologic Oncology) Dorothyann Gibbs, NP as Nurse Practitioner (Gynecologic Oncology) Gery Pray, MD as Consulting Physician (Radiation Oncology) Harmon Pier, RN as Registered Nurse Lahoma Crocker, MD as Consulting Physician (Obstetrics and Gynecology)  DIAGNOSIS:    ICD-10-CM   1. Malignant neoplasm of upper-inner quadrant of left breast in female, estrogen receptor positive ([place])  C50.212    Z17.0       SUMMARY OF ONCOLOGIC HISTORY: Oncology History  Vulvar cancer ([place])  01/29/2020 Initial Diagnosis   Vulvar cancer ([place])   03/11/2020 - 05/04/2020 Chemotherapy   The patient had dexamethasone (DECADRON) 4 MG tablet, 4 mg (100 % of original dose 4 mg), Oral, Daily, 1 of 1 cycle, Start date: 02/27/2020, End date: 05/05/2020 Dose modification: 4 mg (original dose 4 mg, Cycle 0) palonosetron (ALOXI) injection 0.25 mg, 0.25 mg, Intravenous,  Once, 1 of 1 cycle Administration: 0.25 mg (03/11/2020), 0.25 mg (03/17/2020), 0.25 mg (03/24/2020), 0.25 mg (03/31/2020), 0.25 mg (04/07/2020), 0.25 mg (04/14/2020), 0.25 mg (04/21/2020) CARBOplatin (PARAPLATIN) 300 mg in sodium chloride 0.9 % 250 mL chemo infusion, 300 mg (100 % of original dose 300 mg), Intravenous,  Once, 1 of 1 cycle Dose modification:   (original dose 300 mg, Cycle 1) Administration: 300 mg (03/11/2020), 290 mg (03/17/2020), 300 mg (03/24/2020), 230 mg (03/31/2020), 230 mg (04/07/2020), 230 mg (04/14/2020) PACLitaxel (TAXOL) 108 mg in sodium chloride 0.9 % 250 mL chemo infusion (</= 26m/m2), 50 mg/m2 = 108 mg, Intravenous,  Once, 1 of 1 cycle Dose modification: 45 mg/m2 (original dose 50 mg/m2, Cycle 1, Reason: Dose not  tolerated) Administration: 108 mg (03/11/2020), 108 mg (03/17/2020), 108 mg (03/24/2020), 96 mg (03/31/2020), 96 mg (04/07/2020), 96 mg (04/14/2020) fosaprepitant (EMEND) 150 mg in sodium chloride 0.9 % 145 mL IVPB, 150 mg, Intravenous,  Once, 1 of 1 cycle Administration: 150 mg (03/24/2020), 150 mg (03/31/2020), 150 mg (04/07/2020), 150 mg (04/14/2020) trastuzumab-anns (KANJINTI) 378 mg in sodium chloride 0.9 % 250 mL chemo infusion, 4 mg/kg = 378 mg (100 % of original dose 4 mg/kg), Intravenous,  Once, 1 of 1 cycle Dose modification: 4 mg/kg (original dose 4 mg/kg, Cycle 1, Reason: Other (see comments), Comment: loading dose) Administration: 378 mg (03/11/2020), 189 mg (03/17/2020), 189 mg (03/24/2020), 189 mg (03/31/2020), 189 mg (04/07/2020), 189 mg (04/14/2020), 189 mg (04/21/2020), 189 mg (05/04/2020)   for chemotherapy treatment.     03/16/2020 - 05/08/2020 Radiation Therapy   Patient received 45 Gy in 25 treatments to pelvis Boost received 16.2 Gy in 9 treatments to vulva    Genetic Testing   Patient has genetic testing done for 06/22/2020. Results revealed patient has the following mutation(s): BRIP 1 mutation   Malignant neoplasm of upper-inner quadrant of left breast in female, estrogen receptor positive ([place]  02/20/2020 Initial Diagnosis   PET scan on 02/07/20 following a diagnosis of vulvar cancer showed a 1.7cm left breast mass and mildly hypermetabolic left axillary lymph nodes. [place]ogram and [place] 02[place]20 showed a 3.1cm left breast mass at the 11 o'clock position, palpable on exam, and a single abnormal left axillary lymph node with cortical thickening. Left breast biopsy on 02/20/20 showed invasive and in situ carcinoma in the breast and axilla, grade 3, HER-2 positive (3+), ER/PR+  95%, Ki67 75%   02/25/2020 Cancer Staging   Staging form: Breast, AJCC 8th Edition - Clinical stage from 02/25/2020: Stage IB (cT2, cN1, cM0, G3, ER+, PR+, HER2+) - Signed by Nicholas Lose, MD on 02/25/2020    05/29/2020 -  05/29/2020 Chemotherapy   The patient had palonosetron (ALOXI) injection 0.25 mg, 0.25 mg, Intravenous,  Once, 1 of 3 cycles Administration: 0.25 mg (05/29/2020) pegfilgrastim-jmdb (FULPHILA) injection 6 mg, 6 mg, Subcutaneous,  Once, 1 of 3 cycles CARBOplatin (PARAPLATIN) 580 mg in sodium chloride 0.9 % 250 mL chemo infusion, 580 mg (100 % of original dose 580 mg), Intravenous,  Once, 1 of 3 cycles Dose modification:   (original dose 580 mg, Cycle 1) Administration: 580 mg (05/29/2020) DOCEtaxel (TAXOTERE) 120 mg in sodium chloride 0.9 % 250 mL chemo infusion, 60 mg/m2 = 120 mg (100 % of original dose 60 mg/m2), Intravenous,  Once, 1 of 3 cycles Dose modification: 60 mg/m2 (original dose 60 mg/m2, Cycle 1, Reason: Provider Judgment), 50 mg/m2 (original dose 60 mg/m2, Cycle 2, Reason: Dose not tolerated) Administration: 120 mg (05/29/2020) fosaprepitant (EMEND) 150 mg in sodium chloride 0.9 % 145 mL IVPB, 150 mg, Intravenous,  Once, 1 of 3 cycles Administration: 150 mg (05/29/2020) pertuzumab (PERJETA) 420 mg in sodium chloride 0.9 % 250 mL chemo infusion, 420 mg (50 % of original dose 840 mg), Intravenous, Once, 1 of 3 cycles Dose modification: 420 mg (original dose 840 mg, Cycle 1, Reason: Provider Judgment) Administration: 420 mg (05/29/2020) trastuzumab-dkst (OGIVRI) 525 mg in sodium chloride 0.9 % 250 mL chemo infusion, 6 mg/kg = 525 mg (75 % of original dose 8 mg/kg), Intravenous,  Once, 1 of 3 cycles Dose modification: 6 mg/kg (original dose 8 mg/kg, Cycle 1, Reason: Provider Judgment) Administration: 525 mg (05/29/2020)   for chemotherapy treatment.     06/22/2020 Genetic Testing   Positive genetic testing: Heterozygous pathogenic variant detected in BRIP1 gene at c.2010dup (p.Glu671*).  Variant of uncertain significance detected in POLD1 at c.532G>A (p.Gly178Arg).  No other pathogenic variants detected in [facility] Common Hereditary Cancers Panel.  The report date is June 22, 2020.   The  Common Hereditary Cancers Panel offered by [facility] includes sequencing and/or deletion duplication testing of the following 48 genes: APC, ATM, AXIN2, BARD1, BMPR1A, BRCA1, BRCA2, BRIP1, CDH1, CDK4, CDKN2A (p14ARF), CDKN2A (p16INK4a), CHEK2, CTNNA1, DICER1, EPCAM (Deletion/duplication testing only), GREM1 (promoter region deletion/duplication testing only), KIT, MEN1, MLH1, MSH2, MSH3, MSH6, MUTYH, NBN, NF1, NHTL1, PALB2, PDGFRA, PMS2, POLD1, POLE, PTEN, RAD50, RAD51C, RAD51D, RNF43, SDHB, SDHC, SDHD, SMAD4, SMARCA4. STK11, TP53, TSC1, TSC2, and VHL.  The following genes were evaluated for sequence changes only: SDHA and HOXB13 c.251G>A variant only.   07/27/2020 Surgery   Left mastectomy [name]): three foci of IDC, 3.7cm, 1.5cm, and 1.2cm, with intermediate to high grade DCIS, clear margins, with 1/2 left axillary lymph nodes positive for carcinoma.    08/18/2020 -  Chemotherapy    Patient is on Treatment Plan: BREAST ADO-TRASTUZUMAB EMTANSINE [facility]) Q21D       09/09/2020 - 10/23/2020 Radiation Therapy   Adjuvant radiation     CHIEF COMPLIANT: Kadcyla maintenance  INTERVAL HISTORY: Sandra Miller is a 45 y.o. with above-mentioned history of left breast cancer and vulvar cancer who completed neoadjuvant chemotherapy, underwent a left mastectomy, radiation, and is currently on Kadcyla maintenance. She also has a history of DVT currently on Xarelto. She presents to the clinic today for treatment.  She is complaining of not feeling well.  She  has nausea constantly.  She is profound fatigue.  She works the night shift and therefore she does not eat anything.  She has lost significant weight.  She also had excruciating pain in the lower quadrant and she is getting a CT scan in the next couple of days.  She is extremely tearful and depressed.  She sees a psychiatrist.  ALLERGIES:  is allergic to dilaudid [hydromorphone hcl], depakote [divalproex sodium], minocycline, and aspirin.  MEDICATIONS:   Current Outpatient Medications  Medication Sig Dispense Refill   acetaminophen (TYLENOL) 500 MG tablet Take 2 tablets (1,000 mg total) by mouth every 6 (six) hours as needed for moderate pain or fever. Avoid tylenol until your liver function back to normal 30 tablet 0   Cholecalciferol (VITAMIN D3) 125 MCG (5000 UT) CAPS Take by mouth.     diclofenac Sodium (VOLTAREN) 1 % GEL Apply 4 grams topically 4  times daily as needed. 500 g 6   diphenoxylate-atropine (LOMOTIL) 2.5-0.025 MG tablet TAKE 1 TO 2 TABLETS BY MOUTH FOUR TIMES DAILY AS NEEDED FOR DIARRHEA (Patient taking differently: Take 1-2 tablets by mouth 4 (four) times daily as needed for diarrhea or loose stools. TAKE 1 TO 2 TABLETS BY MOUTH FOUR TIMES DAILY AS NEEDED FOR DIARRHEA) 45 tablet 1   doxylamine, Sleep, (UNISOM) 25 MG tablet Take 25 mg by mouth at bedtime as needed for sleep.     gabapentin (NEURONTIN) 300 MG capsule Take 1 capsule (300 mg total) by mouth daily. 30 capsule 5   gabapentin (NEURONTIN) 400 MG capsule Take 1 capsule (400 mg total) by mouth at bedtime. 30 capsule 0   Glucosamine 750 MG TABS Take 750 mg by mouth in the morning and at bedtime.     letrozole (FEMARA) 2.5 MG tablet Take 1 tablet (2.5 mg total) by mouth daily. 90 tablet 3   lisinopril (ZESTRIL) 20 MG tablet TAKE 1/2 TABLET BY MOUTH ONCE A DAY 90 tablet 3   loratadine (CLARITIN) 10 MG tablet Take 10 mg by mouth daily.     LORazepam (ATIVAN) 1 MG tablet Take 1 tablet by mouth 3 times daily as needed for anxiety. 90 tablet 2   magnesium oxide (MAG-OX) 400 (241.3 Mg) MG tablet Take 1 tablet (400 mg total) by mouth 2 (two) times daily. 60 tablet 1   Multiple Vitamin (MULTIVITAMIN WITH MINERALS) TABS tablet Take 1 tablet by mouth daily.     nicotine (NICODERM CQ) 21 mg/24hr patch Place 1 patch (21 mg total) onto the skin daily. 28 patch 0   OLANZapine (ZYPREXA) 7.5 MG tablet Take 1 tablet (7.5 mg total) by mouth at bedtime. 30 tablet 2   omeprazole (PRILOSEC) 40  MG capsule TAKE 1 CAPSULE BY MOUTH EVERY MORNING AND EVERY NIGHT AT BEDTIME 60 capsule 3   promethazine (PHENERGAN) 25 MG tablet TAKE 1 TABLET BY MOUTH EVERY 6 HOURS AS NEEDED FOR NAUSEA 30 tablet 0   rivaroxaban (XARELTO) 20 MG TABS tablet TAKE 1 TABLET BY MOUTH DAILY WITH SUPPER. 30 tablet 6   rosuvastatin (CRESTOR) 10 MG tablet TAKE 1 TABLET BY MOUTH ONCE A DAY 90 tablet 3   senna-docusate (SENOKOT-S) 8.6-50 MG tablet Take 2 tablets by mouth at bedtime. For AFTER surgery only, do not take if having diarrhea 30 tablet 0   Turmeric 450 MG CAPS Take 450 mg by mouth in the morning and at bedtime.     venlafaxine XR (EFFEXOR-XR) 37.5 MG 24 hr capsule TAKE 3 CAPSULES BY MOUTH ONCE  A DAY 90 capsule 2   No current facility-administered medications for this visit.   Facility-Administered Medications Ordered in Other Visits  Medication Dose Route Frequency Provider Last Rate Last Admin   LORazepam (ATIVAN) injection 0.5 mg  0.5 mg Intravenous Once Harle Stanford., PA-C        PHYSICAL EXAMINATION: ECOG PERFORMANCE STATUS: 1 - Symptomatic but completely ambulatory  Vitals:   07/26/21 0845  BP: 128/62  Pulse: (!) 124  Resp: 18  Temp: (!) 97.2 F (36.2 C)  SpO2: 100%   Filed Weights   07/26/21 0845  Weight: 180 lb 11.2 oz (82 kg)      LABORATORY DATA:  I have reviewed the data as listed CMP Latest Ref Rng & Units 07/01/2021 05/06/2021 04/06/2021  Glucose 70 - 99 mg/dL 102(H) 98 127(H)  BUN 6 - 20 mg/dL _0 Creatinine 0.44 - 1.00 mg/dL 0.67 0.74 0.75  Sodium 135 - 145 mmol/L 139 136 138  Potassium 3.5 - 5.1 mmol/L 3.9 3.5 3.4(L)  Chloride 98 - 111 mmol/L 104 101 102  CO2 22 - 32 mmol/L _1 Calcium 8.9 - 10.3 mg/dL 10.6(H) 9.4 9.9  Total Protein 6.5 - 8.1 g/dL 7.6 7.6 7.9  Total Bilirubin 0.3 - 1.2 mg/dL 0.6 0.4 0.5  Alkaline Phos 38 - 126 U/L 64 69 65  AST 15 - 41 U/L 27 28 32  ALT 0 - 44 U/L _2 Lab Results  Component Value Date   WBC 7.1 07/01/2021   HGB  11.3 (L) 07/01/2021   HCT 34.3 (L) 07/01/2021   MCV 87.3 07/01/2021   PLT 296 07/01/2021   NEUTROABS 4.5 07/01/2021    ASSESSMENT & PLAN:  Malignant neoplasm of upper-inner quadrant of left breast in female, estrogen receptor positive ([place]) Breast cancer and vulvar cancers   02/20/2020:PET scan on 02/07/20 following a diagnosis of vulvar cancer showed a 1.7cm left breast mass and mildly hypermetabolic left axillary lymph nodes. Mammogram and [place] on 02/18/20 showed a 3.1cm left breast mass at the 11 o'clock position, palpable on exam, and a single abnormal left axillary lymph node with cortical thickening. Left breast biopsy on 02/20/20 showed invasive and in situ carcinoma in the breast and axilla, grade 3, HER-2 positive (3+), ER/PR+ 95%, Ki67 75%   Treatment plan: 1.  Neoadjuvant chemoradiation for vulvar cancer with Taxol, carboplatin, Herceptin weekly x6 cycles 2. neoadjuvant chemotherapy with TCH Perjeta x3 cycles 3. 07/27/20: Left mastectomy [name]): three foci of IDC, 3.7cm, 1.5cm, and 1.2cm, with intermediate to high grade DCIS, clear margins, with 1/2 left axillary lymph nodes positive for carcinoma. 4. Followed by adjuvant radiation therapy to be completed on 10/23/20 5.  Kadcyla maintenance completed 04/06/2021 6.  Adjuvant antiestrogen therapy with anastrozole started 12/01/2020 switched to letrozole 02/23/2021 7.  Adjuvant neratinib ------------------------------------------------------------------------------------------------------------------- Current treatment: Letrozole (held for hot flashes) Letrozole toxicities: 1.  Severe hot flashes (especially since she underwent hysterectomy and bilateral salpingo-oophorectomy in September 2022) Currently on gabapentin and Effexor 2. nausea: I sent a prescription for Phenergan. 3.  Weight loss: Due to inadequate oral intake. 4.  Abdominal pain: CT scans are happening on 07/28/2021. 5.  Depression: Sees psychiatry.  I will request our  counselor/chaplain Lattie Haw to counsel her   We stopped letrozole because of her systemic symptoms of depression, nausea, profound fatigue and hot flashes: We may consider resuming this medicine in 3 months Return to clinic in 3 months for follow-up  No orders of the defined types were placed in this encounter.  The patient has a good understanding of the overall plan. she agrees with it. she will call with any problems that may develop before the next visit here.  Total time spent: 30 mins including face to face time and time spent for planning, charting and coordination of care  Rulon Eisenmenger, MD, MPH 07/26/2021  I, Thana Ates, am acting as scribe for Dr. Nicholas Lose.  I have reviewed the above documentation for accuracy and completeness, and I agree with the above.

## 2021-07-26 ENCOUNTER — Other Ambulatory Visit (HOSPITAL_COMMUNITY): Payer: Self-pay

## 2021-07-26 ENCOUNTER — Encounter: Payer: Self-pay | Admitting: General Practice

## 2021-07-26 ENCOUNTER — Other Ambulatory Visit: Payer: Self-pay

## 2021-07-26 ENCOUNTER — Inpatient Hospital Stay: Payer: PRIVATE HEALTH INSURANCE | Attending: Gynecologic Oncology | Admitting: Hematology and Oncology

## 2021-07-26 DIAGNOSIS — C50212 Malignant neoplasm of upper-inner quadrant of left female breast: Secondary | ICD-10-CM | POA: Insufficient documentation

## 2021-07-26 DIAGNOSIS — F32A Depression, unspecified: Secondary | ICD-10-CM | POA: Insufficient documentation

## 2021-07-26 DIAGNOSIS — R109 Unspecified abdominal pain: Secondary | ICD-10-CM | POA: Diagnosis not present

## 2021-07-26 DIAGNOSIS — R634 Abnormal weight loss: Secondary | ICD-10-CM | POA: Insufficient documentation

## 2021-07-26 DIAGNOSIS — N951 Menopausal and female climacteric states: Secondary | ICD-10-CM | POA: Insufficient documentation

## 2021-07-26 DIAGNOSIS — Z9012 Acquired absence of left breast and nipple: Secondary | ICD-10-CM | POA: Insufficient documentation

## 2021-07-26 DIAGNOSIS — Z79899 Other long term (current) drug therapy: Secondary | ICD-10-CM | POA: Insufficient documentation

## 2021-07-26 DIAGNOSIS — Z90722 Acquired absence of ovaries, bilateral: Secondary | ICD-10-CM | POA: Diagnosis not present

## 2021-07-26 DIAGNOSIS — C519 Malignant neoplasm of vulva, unspecified: Secondary | ICD-10-CM | POA: Insufficient documentation

## 2021-07-26 DIAGNOSIS — Z170 Estrogen receptor positive status [ER+]: Secondary | ICD-10-CM | POA: Diagnosis not present

## 2021-07-26 DIAGNOSIS — Z90710 Acquired absence of both cervix and uterus: Secondary | ICD-10-CM | POA: Insufficient documentation

## 2021-07-26 DIAGNOSIS — Z86711 Personal history of pulmonary embolism: Secondary | ICD-10-CM | POA: Diagnosis not present

## 2021-07-26 DIAGNOSIS — Z9079 Acquired absence of other genital organ(s): Secondary | ICD-10-CM | POA: Insufficient documentation

## 2021-07-26 DIAGNOSIS — R5383 Other fatigue: Secondary | ICD-10-CM | POA: Insufficient documentation

## 2021-07-26 DIAGNOSIS — Z7901 Long term (current) use of anticoagulants: Secondary | ICD-10-CM | POA: Diagnosis not present

## 2021-07-26 DIAGNOSIS — R110 Nausea: Secondary | ICD-10-CM | POA: Diagnosis not present

## 2021-07-26 MED ORDER — PROMETHAZINE HCL 25 MG PO TABS
ORAL_TABLET | Freq: Four times a day (QID) | ORAL | 6 refills | Status: DC | PRN
  Filled 2021-07-26: qty 30, 7d supply, fill #0
  Filled 2021-08-31: qty 30, 7d supply, fill #1
  Filled 2021-10-01: qty 30, 7d supply, fill #2
  Filled 2021-12-03: qty 30, 7d supply, fill #3
  Filled 2022-07-12 – 2022-07-26 (×2): qty 30, 7d supply, fill #4

## 2021-07-26 NOTE — Progress Notes (Signed)
[facility] Spiritual Care Note  Referred by Dr Lindi Adie and Merleen Nicely DeSota/RN for emotional support per Caliann's request to speak with a chaplain. Left voicemail encouraging return call.   [place], [place], [facility] Pager [phone] Voicemail [phone]

## 2021-07-26 NOTE — Assessment & Plan Note (Signed)
Breast cancer and vulvar cancers  02/20/2020:PET scan on 02/07/20 following a diagnosis of vulvar cancer showed a 1.7cm left breast mass and mildly hypermetabolic left axillary lymph nodes. Mammogram and [place] on 02/18/20 showed a 3.1cm left breast mass at the 11 o'clock position, palpable on exam, and a single abnormal left axillary lymph node with cortical thickening. Left breast biopsy on 02/20/20 showed invasive and in situ carcinoma in the breast and axilla, grade 3, HER-2 positive (3+), ER/PR+ 95%, Ki67 75%  Treatment plan: 1.Neoadjuvant chemoradiation for vulvar cancer with Taxol, carboplatin, Herceptin weekly x6 cycles 2.neoadjuvant chemotherapy with TCH Perjeta x3 cycles 3.07/27/20:Left mastectomy Sandra Miller): three foci of IDC, 3.7cm, 1.5cm, and 1.2cm, with intermediate to high grade DCIS, clear margins, with 1/2 left axillary lymph nodes positive for carcinoma. 4. Followed by adjuvant radiation therapyto be completed on 10/23/20 5.Kadcyla maintenance completed 04/06/2021 6.Adjuvant antiestrogen therapywith anastrozole started 3/29/2022switched to letrozole 02/23/2021 7.Adjuvant neratinib ------------------------------------------------------------------------------------------------------------------- Current treatment: Letrozole (held for hot flashes) Letrozole toxicities: 1.  Severe hot flashes (especially since she underwent hysterectomy and bilateral salpingo-oophorectomy in September 2022)  Start the patient on Effexor Exar and once the hot flashes are better she will start back on letrozole therapy. Return to clinic in 1 year for follow-up

## 2021-07-27 ENCOUNTER — Encounter: Payer: Self-pay | Admitting: General Practice

## 2021-07-27 NOTE — Progress Notes (Signed)
[facility] Spiritual Care Note  Left another voicemail, again encouraging return call.   [place], [place], [facility] Pager [phone] Voicemail [phone]

## 2021-07-28 ENCOUNTER — Ambulatory Visit (HOSPITAL_BASED_OUTPATIENT_CLINIC_OR_DEPARTMENT_OTHER): Admission: RE | Admit: 2021-07-28 | Payer: PRIVATE HEALTH INSURANCE | Source: Ambulatory Visit

## 2021-08-02 ENCOUNTER — Encounter: Payer: Self-pay | Admitting: General Practice

## 2021-08-02 NOTE — Progress Notes (Signed)
[facility] Spiritual Care Note  Left voicemail encouraging return call.   Chaplain Shakil Dirk, MDiv, [facility] Pager [phone] Voicemail [phone]  

## 2021-08-05 ENCOUNTER — Other Ambulatory Visit (HOSPITAL_COMMUNITY): Payer: Self-pay

## 2021-08-06 ENCOUNTER — Ambulatory Visit: Payer: PRIVATE HEALTH INSURANCE | Attending: Internal Medicine | Admitting: Internal Medicine

## 2021-08-06 ENCOUNTER — Telehealth: Payer: Self-pay | Admitting: *Deleted

## 2021-08-06 ENCOUNTER — Other Ambulatory Visit: Payer: Self-pay

## 2021-08-06 ENCOUNTER — Encounter: Payer: Self-pay | Admitting: Internal Medicine

## 2021-08-06 ENCOUNTER — Encounter: Payer: Self-pay | Admitting: Hematology and Oncology

## 2021-08-06 ENCOUNTER — Ambulatory Visit (HOSPITAL_BASED_OUTPATIENT_CLINIC_OR_DEPARTMENT_OTHER)
Admission: RE | Admit: 2021-08-06 | Discharge: 2021-08-06 | Disposition: A | Discharge status: Home / Self Care | Payer: PRIVATE HEALTH INSURANCE | Source: Ambulatory Visit | Attending: Radiation Oncology | Admitting: Radiation Oncology

## 2021-08-06 VITALS — BP 145/83 | HR 126 | Resp 16 | Wt 180.4 lb

## 2021-08-06 DIAGNOSIS — R7303 Prediabetes: Secondary | ICD-10-CM | POA: Diagnosis not present

## 2021-08-06 DIAGNOSIS — R7989 Other specified abnormal findings of blood chemistry: Secondary | ICD-10-CM

## 2021-08-06 DIAGNOSIS — R002 Palpitations: Secondary | ICD-10-CM | POA: Diagnosis not present

## 2021-08-06 DIAGNOSIS — F17200 Nicotine dependence, unspecified, uncomplicated: Secondary | ICD-10-CM

## 2021-08-06 DIAGNOSIS — F411 Generalized anxiety disorder: Secondary | ICD-10-CM

## 2021-08-06 DIAGNOSIS — Z23 Encounter for immunization: Secondary | ICD-10-CM | POA: Diagnosis not present

## 2021-08-06 DIAGNOSIS — I10 Essential (primary) hypertension: Secondary | ICD-10-CM

## 2021-08-06 DIAGNOSIS — C519 Malignant neoplasm of vulva, unspecified: Secondary | ICD-10-CM | POA: Insufficient documentation

## 2021-08-06 DIAGNOSIS — Z86718 Personal history of other venous thrombosis and embolism: Secondary | ICD-10-CM

## 2021-08-06 DIAGNOSIS — E782 Mixed hyperlipidemia: Secondary | ICD-10-CM

## 2021-08-06 DIAGNOSIS — F321 Major depressive disorder, single episode, moderate: Secondary | ICD-10-CM

## 2021-08-06 DIAGNOSIS — F17210 Nicotine dependence, cigarettes, uncomplicated: Secondary | ICD-10-CM

## 2021-08-06 LAB — POCT I-STAT CREATININE: Creatinine, Ser: 0.5 mg/dL (ref 0.44–1.00)

## 2021-08-06 LAB — POCT GLYCOSYLATED HEMOGLOBIN (HGB A1C): HbA1c, POC (prediabetic range): 5.7 % (ref 5.7–6.4)

## 2021-08-06 LAB — GLUCOSE, POCT (MANUAL RESULT ENTRY): POC Glucose: 140 mg/dl — AB (ref 70–99)

## 2021-08-06 MED ORDER — IOHEXOL 300 MG/ML  SOLN
80.0000 mL | Freq: Once | INTRAMUSCULAR | Status: AC | PRN
  Administered 2021-08-06: 80 mL via INTRAVENOUS

## 2021-08-06 NOTE — Progress Notes (Signed)
Patient ID: Sandra Miller, female    DOB: 12/26/1975  MRN: [id number]  CC: New Patient (Initial Visit)   Subjective: Sandra Miller is a 45 y.o. female who presents for new patient visit Her concerns today include:  Patient with history of HTN, HL, prediabetes, obesity, tob dep, RLS, DVT of the RT IJV and surrounding IJ port 04/2020 GERD, hidradenitis suppurativa, breast CA left dx 02/2020 (ERP s/p left mastectomy, neoadjuvant chemotherapy and adjuvant XRT; Dr. Lindi Adie).  Vulvar cancer (dx 01/2020), HSV infection, genitial warts, bipolar disorder/dep/anx (Dr. [name] Schilder)  Patient presents to establish care.  She is a previous Furniture conservator/restorer.   Currently employed at [employer] and [employer] since February 2022.  HTN:  on Lisinopril 20 mg 1/2 tab daily.  Takes at nights and forgot to take last night Recent BP readings good at recent doctors' visits Loves salt.  Putting salt on things helps to settle stomach especially when she was receiving chemo.   HL: taking and tolerating Crestor  Tob dep: smoking 1/2 pk a day.  Was using patches but feels she is not mentally ready to quit.  She reports daily worry that the cancer will come back.  She has uncontrollable worry about her family; always feels like something bad may happen to them.  Can not relax.  Pulse rate always high with palpitations sometimes.  She tells me Femara was stopped recently because she felt it was contributing to the tachycardia. Not sleeping well, cries easily. Dr. Geralyn Flash office referred her to a therapist.  She was called by the therapist but pt has not called her back as yet. -also dealing with chronic pain and hot flashes since hysterectomy.  On Gabapentin 300 mg and Effexor. Also on Ativan, Zyprexa.  Denies SI.  DVT: blood clot in neck. Has been on Xarelto for at least 1 yr through her oncologist Pt reports being in remission from her cancers since 04/2021  Patient Active Problem List   Diagnosis Date Noted   Bipolar 1  disorder ([place]) 05/07/2021   Class 2 obesity in adult 05/07/2021   Hyperlipidemia 08/12/2020   Pre-diabetes 08/12/2020   Hypomagnesemia 08/12/2020   Mutation in [place] gene 06/24/2020   Hypokalemia 06/17/2020   Dehydration    DVT (deep venous thrombosis) ([place]) 06/16/2020   Colitis 06/16/2020   Acute embolism and thrombosis of right internal jugular vein ([place]) 06/12/2020   Port-A-Cath in place 03/11/2020   Malignant neoplasm of upper-inner quadrant of left breast in female, estrogen receptor positive ([place]) 02/25/2020   Vulvar cancer ([place]) 01/29/2020   Vulvar ulceration 01/23/2020   Essential hypertension 10/31/2019   Nausea and vomiting 12/27/2018   Seasonal allergic rhinitis 12/27/2018   History of ELISA positive for HSV 10/01/2018   Restless leg syndrome 04/01/2016   Nephrolithiasis 03/08/2016   Obstructive pyelonephritis 03/08/2016   Normocytic anemia 03/08/2016   Hidradenitis suppurativa of left axilla 11/03/2015   Loss of weight 09/08/2015   Neck pain on left side 10/22/2014   Genital warts 05/20/2014   GERD (gastroesophageal reflux disease) 12/26/2012   Functional dyspepsia 12/26/2012   Tobacco abuse 08/22/2012   Herpes simplex virus (HSV) infection 02/03/2007     Current Outpatient Medications on File Prior to Visit  Medication Sig Dispense Refill   acetaminophen (TYLENOL) 500 MG tablet Take 2 tablets (1,000 mg total) by mouth every 6 (six) hours as needed for moderate pain or fever. Avoid tylenol until your liver function back to normal 30 tablet 0  Cholecalciferol (VITAMIN D3) 125 MCG (5000 UT) CAPS Take by mouth.     diclofenac Sodium (VOLTAREN) 1 % GEL Apply 4 grams topically 4  times daily as needed. 500 g 6   diphenoxylate-atropine (LOMOTIL) 2.5-0.025 MG tablet TAKE 1 TO 2 TABLETS BY MOUTH FOUR TIMES DAILY AS NEEDED FOR DIARRHEA (Patient taking differently: Take 1-2 tablets by mouth 4 (four) times daily as needed for diarrhea or loose stools. TAKE 1 TO 2 TABLETS BY  MOUTH FOUR TIMES DAILY AS NEEDED FOR DIARRHEA) 45 tablet 1   doxylamine, Sleep, (UNISOM) 25 MG tablet Take 25 mg by mouth at bedtime as needed for sleep.     gabapentin (NEURONTIN) 300 MG capsule Take 1 capsule (300 mg total) by mouth daily. 30 capsule 5   Glucosamine 750 MG TABS Take 750 mg by mouth in the morning and at bedtime.     lisinopril (ZESTRIL) 20 MG tablet TAKE 1/2 TABLET BY MOUTH ONCE A DAY 90 tablet 3   loratadine (CLARITIN) 10 MG tablet Take 10 mg by mouth daily.     LORazepam (ATIVAN) 1 MG tablet Take 1 tablet by mouth 3 times daily as needed for anxiety. 90 tablet 2   magnesium oxide (MAG-OX) 400 (241.3 Mg) MG tablet Take 1 tablet (400 mg total) by mouth 2 (two) times daily. 60 tablet 1   Multiple Vitamin (MULTIVITAMIN WITH MINERALS) TABS tablet Take 1 tablet by mouth daily.     nicotine (NICODERM CQ) 21 mg/24hr patch Place 1 patch (21 mg total) onto the skin daily. 28 patch 0   OLANZapine (ZYPREXA) 7.5 MG tablet Take 1 tablet (7.5 mg total) by mouth at bedtime. 30 tablet 2   omeprazole (PRILOSEC) 40 MG capsule TAKE 1 CAPSULE BY MOUTH EVERY MORNING AND EVERY NIGHT AT BEDTIME 60 capsule 3   promethazine (PHENERGAN) 25 MG tablet TAKE 1 TABLET BY MOUTH EVERY 6 HOURS AS NEEDED FOR NAUSEA 30 tablet 6   rivaroxaban (XARELTO) 20 MG TABS tablet TAKE 1 TABLET BY MOUTH DAILY WITH SUPPER. 30 tablet 6   rosuvastatin (CRESTOR) 10 MG tablet TAKE 1 TABLET BY MOUTH ONCE A DAY 90 tablet 3   Turmeric 450 MG CAPS Take 450 mg by mouth in the morning and at bedtime.     venlafaxine XR (EFFEXOR-XR) 37.5 MG 24 hr capsule TAKE 3 CAPSULES BY MOUTH ONCE A DAY 90 capsule 2   senna-docusate (SENOKOT-S) 8.6-50 MG tablet Take 2 tablets by mouth at bedtime. For AFTER surgery only, do not take if having diarrhea (Patient not taking: Reported on 08/06/2021) 30 tablet 0   [DISCONTINUED] prochlorperazine (COMPAZINE) 10 MG tablet TAKE 1 TABLET BY MOUTH EVERY 6 HOURS AS NEEDED FOR NAUSEA OR VOMITING (Patient taking  differently: Take 10 mg by mouth every 6 (six) hours as needed for nausea or vomiting. ) 30 tablet 1   Current Facility-Administered Medications on File Prior to Visit  Medication Dose Route Frequency Provider Last Rate Last Admin   LORazepam (ATIVAN) injection 0.5 mg  0.5 mg Intravenous Once Harle Stanford., PA-C        Allergies  Allergen Reactions   Dilaudid [Hydromorphone Hcl] Other (See Comments)    Pt became confused, pulled out iv's, does not remember anything   Depakote [Divalproex Sodium] Nausea And Vomiting   Minocycline Hives   Aspirin Hives    States able to tolerate Goody Powders without any problem except for GI upset    Social History   Socioeconomic History   Marital  status: Single    Spouse name: Not on file   Number of children: Not on file   Years of education: Not on file   Highest education level: Not on file  Occupational History   Not on file  Tobacco Use   Smoking status: Every Day    Packs/day: 0.50    Years: 22.00    Pack years: 11.00    Types: Cigarettes   Smokeless tobacco: Never   Tobacco comments:    Recently started a smoking cessation class.   Vaping Use   Vaping Use: Never used  Substance and Sexual Activity   Alcohol use: Not Currently    Alcohol/week: 0.0 standard drinks    Comment:  hx alcohollism  in remission since 2014   Drug use: Yes    Types: Marijuana    Comment: occas.   Sexual activity: Yes    Partners: Male    Birth control/protection: Surgical  Other Topics Concern   Not on file  Social History Narrative   Former [facility] patient.      Was on disability at one point.   Return to the workforce.  40 hours a week at [employer], 10 hours a week on the weekends at [employer] in [place] at night 10 to 12 hours a week.      Has grown children, she lives alone with a pet, continues to smoke no alcohol or drug use at this time      History of EtOH abuse and THC use.         Social Determinants  of Health   Financial Resource Strain: Low Risk    Difficulty of Paying Living Expenses: Not hard at all  Food Insecurity: No Food Insecurity   Worried About Charity fundraiser in the Last Year: Never true   [place] in the Last Year: Never true  Transportation Needs: No Transportation Needs   Lack of Transportation (Medical): No   Lack of Transportation (Non-Medical): No  Physical Activity: Inactive   Days of Exercise per Week: 0 days   Minutes of Exercise per Session: 0 min  Stress: Stress Concern Present   Feeling of Stress : Rather much  Social Connections: Socially Isolated   Frequency of Communication with Friends and Family: More than three times a week   Frequency of Social Gatherings with Friends and Family: More than three times a week   Attends Religious Services: Never   Marine scientist or Organizations: No   Attends Music therapist: Never   Marital Status: Never married  Human resources officer Violence: Not At Risk   Fear of Current or Ex-Partner: No   Emotionally Abused: No   Physically Abused: No   Sexually Abused: No    Family History  Problem Relation Age of Onset   Heart disease Father    Lung cancer Father        d. 31   Alcohol abuse Father    Heart disease Mother    Depression Mother    Anxiety disorder Mother    Drug abuse Brother    Alcohol abuse Brother    Drug abuse Brother    ADD / ADHD Brother    Colon polyps Brother    Cancer Paternal Grandfather        "stomach"   Diabetes Maternal Grandfather    Diabetes Paternal Grandmother    Kidney disease Maternal Uncle    Cirrhosis Cousin  alcoholic   Anxiety disorder Maternal Aunt    Depression Maternal Aunt    Cancer Cousin        maternal; ovarian cancer or other "female" cancer?   Lung cancer Paternal Uncle 45   Throat cancer Cousin        paternal; dx 44s   Lung cancer Cousin        paternal; dx 76s    Past Surgical History:  Procedure Laterality Date    CESAREAN SECTION  1995   w/  Bilateral Tubal Ligation   COLONOSCOPY  last one 08-09-2013   CYSTOSCOPY W/ URETERAL STENT PLACEMENT Left 03/29/2016   Procedure: CYSTOSCOPY WITH STENT REPLACEMENT;  Surgeon: Nickie Retort, MD;  Location: [facility];  Service: Urology;  Laterality: Left;   CYSTOSCOPY WITH RETROGRADE PYELOGRAM, URETEROSCOPY AND STENT PLACEMENT Left 03/08/2016   Procedure: CYSTOSCOPY WITH  LEFT RETROGRADE PYELOGRAM, AND STENT PLACEMENT;  Surgeon: Nickie Retort, MD;  Location: [facility];  Service: Urology;  Laterality: Left;   CYSTOSCOPY/RETROGRADE/URETEROSCOPY/STONE EXTRACTION WITH BASKET Left 03/29/2016   Procedure: CYSTOSCOPY/RETROGRADE/URETEROSCOPY/STONE EXTRACTION WITH BASKET;  Surgeon: Nickie Retort, MD;  Location[facility];  Service: Urology;  Laterality: Left;   ENDOMETRIAL ABLATION W/ NOVASURE  04-01-2010   ESOPHAGOGASTRODUODENOSCOPY  last one 08-09-2013   KNEE ARTHROSCOPY Left as teen   LASER ABLATION OF THE CERVIX  2012 approx   MASTECTOMY WITH AXILLARY LYMPH NODE DISSECTION Left 07/27/2020   Procedure: LEFT MASTECTOMY WITH LEFT RADIOACTIVE SEED GUIDED TARGETED AXILLARY LYMPH NODE DISSECTION;  Surgeon: Alphonsa Overall, MD;  Location: [facility];  Service: General;  Laterality: Left;   PORT-A-CATH REMOVAL Right 05/18/2021   Procedure: REMOVAL PORT-A-CATH;  Surgeon: Donnie Mesa, MD;  Location: [facility];  Service: General;  Laterality: Right;   PORTACATH PLACEMENT Right 03/06/2020   Procedure: INSERTION PORT-A-CATH WITH ULTRASOUND GUIDANCE;  Surgeon: Alphonsa Overall, MD;  Location: [facility];  Service: General;  Laterality: Right;   ROBOTIC ASSISTED TOTAL HYSTERECTOMY WITH BILATERAL SALPINGO OOPHERECTOMY N/A 05/18/2021   Procedure: XI ROBOTIC ASSISTED TOTAL HYSTERECTOMY WITH BILATERAL SALPINGO OOPHORECTOMY;  Surgeon: Everitt Amber, MD;  Location: [facility];  Service: Gynecology;  Laterality: N/A;   TRANSTHORACIC ECHOCARDIOGRAM  05-19-2006    lvsf normal, ef 55-65%, there was mild flattening of the interventricular septum during diastoli/  RV size at upper limits normal   TUBAL LIGATION     VULVA /PERINEUM BIOPSY N/A 05/18/2021   Procedure: VULVAR BIOPSY;  Surgeon: Everitt Amber, MD;  Location: [facility];  Service: Gynecology;  Laterality: N/A;   WISDOM TOOTH EXTRACTION  age 65 's    ROS: Review of Systems Negative except as stated above  PHYSICAL EXAM: BP (!) 145/83   Pulse (!) 126   Resp 16   Wt 180 lb 6.4 oz (81.8 kg)   SpO2 97%   BMI 29.56 kg/m   Wt Readings from Last 3 Encounters:  08/06/21 180 lb 6.4 oz (81.8 kg)  07/26/21 180 lb 11.2 oz (82 kg)  07/01/21 190 lb 2 oz (86.2 kg)    Physical Exam  General appearance - alert, well appearing, and in no distress Mental status - pt very tearful Neck - bilateral symmetric anterior adenopathy Chest - clear to auscultation, no wheezes, rales or rhonchi, symmetric air entry Heart - tachycardic but regular Extremities - peripheral pulses normal, no pedal edema, no clubbing or cyanosis  Depression screen [facility] 2/9 08/06/2021 07/01/2021 07/01/2021  Decreased Interest 3 3 0  Down, Depressed, Hopeless  3 - 1  PHQ - 2 Score _0 Altered sleeping 3 0 -  Tired, decreased energy 3 1 -  Change in appetite 3 0 -  Feeling bad or failure about yourself  1 0 -  Trouble concentrating 3 2 -  Moving slowly or fidgety/restless 0 0 -  Suicidal thoughts 0 0 -  PHQ-9 Score 19 6 -  Difficult doing work/chores - Not difficult at all -  Some recent data might be hidden   GAD 7 : Generalized Anxiety Score 08/06/2021 01/23/2020 12/11/2019 10/01/2018  Nervous, Anxious, on Edge _1 Control/stop worrying _2 Worry too much - different things _3 Trouble relaxing _4 Restless 0 2 0 0  Easily annoyed or irritable _5 Afraid - awful might happen _6 Total GAD 7 Score _7 CMP Latest Ref Rng & Units 08/06/2021 07/01/2021 05/06/2021  Glucose 70 - 99 mg/dL  - 102(H) 98  BUN 6 - 20 mg/dL - 14 11  Creatinine 0.44 - 1.00 mg/dL 0.50 0.67 0.74  Sodium 135 - 145 mmol/L - 139 136  Potassium 3.5 - 5.1 mmol/L - 3.9 3.5  Chloride 98 - 111 mmol/L - 104 101  CO2 22 - 32 mmol/L - 24 26  Calcium 8.9 - 10.3 mg/dL - 10.6(H) 9.4  Total Protein 6.5 - 8.1 g/dL - 7.6 7.6  Total Bilirubin 0.3 - 1.2 mg/dL - 0.6 0.4  Alkaline Phos 38 - 126 U/L - 64 69  AST 15 - 41 U/L - 27 28  ALT 0 - 44 U/L - 31 20   Lipid Panel     Component Value Date/Time   CHOL 172 11/16/2020 0928   TRIG 116 11/16/2020 0928   HDL 51 11/16/2020 0928   CHOLHDL 3.4 11/16/2020 0928   CHOLHDL 4.7 Ratio 06/13/2008 2303   VLDL 32 06/13/2008 2303   LDLCALC 100 (H) 11/16/2020 0928    CBC    Component Value Date/Time   WBC 7.1 07/01/2021 0926   WBC 8.0 05/06/2021 0846   RBC 3.93 07/01/2021 0926   HGB 11.3 (L) 07/01/2021 0926   HGB 12.9 07/09/2018 0959   HCT 34.3 (L) 07/01/2021 0926   HCT 37.2 07/09/2018 0959   PLT 296 07/01/2021 0926   PLT 356 07/09/2018 0959   MCV 87.3 07/01/2021 0926   MCV 84 07/09/2018 0959   MCH 28.8 07/01/2021 0926   MCHC 32.9 07/01/2021 0926   RDW 13.4 07/01/2021 0926   RDW 13.7 07/09/2018 0959   LYMPHSABS 1.7 07/01/2021 0926   MONOABS 0.8 07/01/2021 0926   EOSABS 0.1 07/01/2021 0926   BASOSABS 0.0 07/01/2021 0926   Lab Results  Component Value Date   HGBA1C 5.7 08/06/2021     ASSESSMENT AND PLAN: 1. Essential hypertension Not at goal.  However patient has not taken her medication last night.  Recent blood pressure readings noted in her chart are good.  Continue lisinopril. Encouraged her to cut back on salt intake.  2. GAD (generalized anxiety disorder) 3. Moderate major depression ([place]) Patient plugged in with mental health services.  Followed by Dr. [name] Schilder.  I have encouraged her to call the therapist back to get in for some counseling.  4. Palpitations I will check thyroid level.  I note that from September of this year she is down about  15  pounds. - TSH  5. Tobacco dependence Strongly advised to quit.  Patient not mentally ready to give a trial of quitting.  6. Prediabetes Discussed and encourage healthy eating habits. - POCT glucose (manual entry) - POCT glycosylated hemoglobin (Hb A1C)  7. Mixed hyperlipidemia Continue Crestor.  8. History of DVT (deep vein thrombosis) Recommend that she speaks with Dr. Lindi Adie about the length of time she may need to be on DOAG since cancer is currently in remission  9. Need for immunization against influenza - Flu Vaccine QUAD 32moIM (Fluarix, Fluzone & Alfiuria Quad PF)  Patient was given the opportunity to ask questions.  Patient verbalized understanding of the plan and was able to repeat key elements of the plan.   ADDENDUM: 08/08/2021:  TSH came back low.  Will add free T3/T4 and TSI level  Orders Placed This Encounter  Procedures   Flu Vaccine QUAD 663moM (Fluarix, Fluzone & Alfiuria Quad PF)   TSH   POCT glucose (manual entry)   POCT glycosylated hemoglobin (Hb A1C)     Requested Prescriptions    No prescriptions requested or ordered in this encounter    Return in about 4 months (around 12/05/2021).  DeKarle PlumberMD, FACP

## 2021-08-06 NOTE — Patient Instructions (Signed)
Please call and schedule an appointment with a therapist who called you earlier.  Try to cut back on the amount that you are smoking.  Prediabetes Eating Plan Prediabetes is a condition that causes blood sugar (glucose) levels to be higher than normal. This increases the risk for developing type 2 diabetes (type 2 diabetes mellitus). Working with a health care provider or nutrition specialist (dietitian) to make diet and lifestyle changes can help prevent the onset of diabetes. These changes may help you: Control your blood glucose levels. Improve your cholesterol levels. Manage your blood pressure. What are tips for following this plan? Reading food labels Read food labels to check the amount of fat, salt (sodium), and sugar in prepackaged foods. Avoid foods that have: Saturated fats. Trans fats. Added sugars. Avoid foods that have more than 300 milligrams (mg) of sodium per serving. Limit your sodium intake to less than 2,300 mg each day. Shopping Avoid buying pre-made and processed foods. Avoid buying drinks with added sugar. Cooking Cook with olive oil. Do not use butter, lard, or ghee. Bake, broil, grill, steam, or boil foods. Avoid frying. Meal planning  Work with your dietitian to create an eating plan that is right for you. This may include tracking how many calories you take in each day. Use a food diary, notebook, or mobile application to track what you eat at each meal. Consider following a Mediterranean diet. This includes: Eating several servings of fresh fruits and vegetables each day. Eating fish at least twice a week. Eating one serving each day of whole grains, beans, nuts, and seeds. Using olive oil instead of other fats. Limiting alcohol. Limiting red meat. Using nonfat or low-fat dairy products. Consider following a plant-based diet. This includes dietary choices that focus on eating mostly vegetables and fruit, grains, beans, nuts, and seeds. If you have high  blood pressure, you may need to limit your sodium intake or follow a diet such as the DASH (Dietary Approaches to Stop Hypertension) eating plan. The DASH diet aims to lower high blood pressure. Lifestyle Set weight loss goals with help from your health care team. It is recommended that most people with prediabetes lose 7% of their body weight. Exercise for at least 30 minutes 5 or more days a week. Attend a support group or seek support from a mental health counselor. Take over-the-counter and prescription medicines only as told by your health care provider. What foods are recommended? Fruits Berries. Bananas. Apples. Oranges. Grapes. Papaya. Mango. Pomegranate. Kiwi. Grapefruit. Cherries. Vegetables Lettuce. Spinach. Peas. Beets. Cauliflower. Cabbage. Broccoli. Carrots. Tomatoes. Squash. Eggplant. Herbs. Peppers. Onions. Cucumbers. Brussels sprouts. Grains Whole grains, such as whole-wheat or whole-grain breads, crackers, cereals, and pasta. Unsweetened oatmeal. Bulgur. Barley. Quinoa. Brown rice. Corn or whole-wheat flour tortillas or taco shells. Meats and other proteins Seafood. Poultry without skin. Lean cuts of pork and beef. Tofu. Eggs. Nuts. Beans. Dairy Low-fat or fat-free dairy products, such as yogurt, cottage cheese, and cheese. Beverages Water. Tea. Coffee. Sugar-free or diet soda. Seltzer water. Low-fat or nonfat milk. Milk alternatives, such as soy or almond milk. Fats and oils Olive oil. Canola oil. Sunflower oil. Grapeseed oil. Avocado. Walnuts. Sweets and desserts Sugar-free or low-fat pudding. Sugar-free or low-fat ice cream and other frozen treats. Seasonings and condiments Herbs. Sodium-free spices. Mustard. Relish. Low-salt, low-sugar ketchup. Low-salt, low-sugar barbecue sauce. Low-fat or fat-free mayonnaise. The items listed above may not be a complete list of recommended foods and beverages. Contact a dietitian for more information. What  foods are not  recommended? Fruits Fruits canned with syrup. Vegetables Canned vegetables. Frozen vegetables with butter or cream sauce. Grains Refined white flour and flour products, such as bread, pasta, snack foods, and cereals. Meats and other proteins Fatty cuts of meat. Poultry with skin. Breaded or fried meat. Processed meats. Dairy Full-fat yogurt, cheese, or milk. Beverages Sweetened drinks, such as iced tea and soda. Fats and oils Butter. Lard. Ghee. Sweets and desserts Baked goods, such as cake, cupcakes, pastries, cookies, and cheesecake. Seasonings and condiments Spice mixes with added salt. Ketchup. Barbecue sauce. Mayonnaise. The items listed above may not be a complete list of foods and beverages that are not recommended. Contact a dietitian for more information. Where to find more information American Diabetes Association: www.diabetes.org Summary You may need to make diet and lifestyle changes to help prevent the onset of diabetes. These changes can help you control blood sugar, improve cholesterol levels, and manage blood pressure. Set weight loss goals with help from your health care team. It is recommended that most people with prediabetes lose 7% of their body weight. Consider following a Mediterranean diet. This includes eating plenty of fresh fruits and vegetables, whole grains, beans, nuts, seeds, fish, and low-fat dairy, and using olive oil instead of other fats. This information is not intended to replace advice given to you by your health care provider. Make sure you discuss any questions you have with your health care provider. Document Revised: 11/21/2019 Document Reviewed: 11/21/2019 Elsevier Patient Education  [place].

## 2021-08-06 NOTE — Telephone Encounter (Signed)
This RN spoke with pt per her call with issues of increased hot flashes since having hysterectomy with bilateral oophorectomy and use of aromatase inhibitor.  Pt states she is currently sleep naked with cotton sheets, she keeps her room at 66 degrees and uses a ceiling fan for air movement.  She states during the day she is " constantly warm and do not wear a coat or long sleeves "  She is currently on effexor per pyschiatry and gabapentin.  This RN discussed hot flashes per her current status of induction of menopause by surgery including goal of therapy is to decrease recurrence of cancer therefore there are limited medications that would help her besides what she is currently on.  This RN did review use of " paced breathing " for hot flashes.  Note per sleep- pt states she is using unisom without much benefit. She does work nights and finds it difficult to sleep during the day and has a very irregular sleep pattern.  This RN discussed above could have an impact of her overall well being - discussed use of melatonin starting at 3 mg ( not to use with the unisom) with goal of getting a regimen for sleeping vs just sleeping when she can.  She has noticed some " seems like I cry a lot but I'm not sad ".  She denies any issues with anxiety or obssesive thoughts " just like start crying ".  This RN validated her issues as well as she is doing what is best to decrease a recurrence of the breast cancer- and need to give her body time to adjust.  Pt stated appreciation of call.  No further needs at this time.

## 2021-08-07 LAB — TSH: TSH: <0.005 u[IU]/mL — ABNORMAL LOW (ref 0.450–4.500)

## 2021-08-09 ENCOUNTER — Telehealth: Payer: Self-pay | Admitting: *Deleted

## 2021-08-09 NOTE — Telephone Encounter (Signed)
Received VM from pt.  RN attempt x1 to return call.  No answer, LVM for pt to return call to the office.  °

## 2021-08-11 ENCOUNTER — Ambulatory Visit: Payer: Self-pay | Admitting: *Deleted

## 2021-08-11 ENCOUNTER — Encounter: Payer: Self-pay | Admitting: General Practice

## 2021-08-11 DIAGNOSIS — E0590 Thyrotoxicosis, unspecified without thyrotoxic crisis or storm: Secondary | ICD-10-CM

## 2021-08-11 NOTE — Addendum Note (Signed)
Addended by: Karle Plumber B on: 08/11/2021 07:39 PM   Modules accepted: Orders

## 2021-08-11 NOTE — Progress Notes (Signed)
[facility]  Received return call from Sandra Miller, providing empathic listening and emotional support as she shared about health and mood stressors. She notes that all of her medical providers, including her psychiatrist, are aware of her cancer diagnoses, insomnia, and depressed mood. We plan to meet in my office Thursday 12/15 at 9:00 am for further support,. I will consult colleagues to try find a counselor who takes her insurance and is familiar with some of the challenges and vulnerabilities that come with cancer.   [place], [place], [facility] Pager [phone] Voicemail [phone]

## 2021-08-11 NOTE — Telephone Encounter (Signed)
Patient was seen in office on Friday Chief Complaint: Overnight Sunday while working night shift the patient\'s lower legs, ankles and feet became very swollen. Symptoms: moderate to severe swelling in both lower legs,ankles and feet Frequency: Occurred overnight while on nightshift Sunday-occurred only once. After elevating for the day they return to normal. Pertinent Negatives: Patient denies SOB/CP/redness Disposition: []ED /[]Urgent Care (no appt availability in office / []Appointment(In office/virtual)/ [] [facility]/ []Home Care    Legs are not swollen today and has not occurred since. She would like Dr. Johnson to call her regarding this and the follow up lab results information.     Reason for Disposition  [1] MODERATE leg swelling (e.g., swelling extends up to knees) AND [2] new-onset or worsening  Answer Assessment - Initial Assessment Questions 1. ONSET: "When did the swelling start?" (e.g., minutes, hours, days)     Sunday 2. LOCATION: "What part of the leg is swollen?"  "Are both legs swollen or just one leg?"     both 3. SEVERITY: "How bad is the swelling?" (e.g., localized; mild, moderate, severe)  - Localized - small area of swelling localized to one leg  - MILD pedal edema - swelling limited to foot and ankle, pitting edema < 1/4 inch (6 mm) deep, rest and elevation eliminate most or all swelling  - MODERATE edema - swelling of lower leg to knee, pitting edema > 1/4 inch (6 mm) deep, rest and elevation only partially reduce swelling  - SEVERE edema - swelling extends above knee, facial or hand swelling present      moderate 4. REDNESS: "Does the swelling look red or infected?"     Felt warm 5. PAIN: "Is the swelling painful to touch?" If Yes, ask: "How painful is it?"   (Scale 1-10; mild, moderate or severe)     10  6. FEVER: "Do you have a fever?" If Yes, ask: "What is it, how was it measured, and when did it start?"      no 7. CAUSE: "What do you think  is causing the leg swelling?"     unknown 8. MEDICAL HISTORY: "Do you have a history of heart failure, kidney disease, liver failure, or cancer?"     Hx of breast/vulvar cancer 9. RECURRENT SYMPTOM: "Have you had leg swelling before?" If Yes, ask: "When was the last time?" "What happened that time?"     no 10. OTHER SYMPTOMS: "Do you have any other symptoms?" (e.g., chest pain, difficulty breathing)       no 11. PREGNANCY: "Is there any chance you are pregnant?" "When was your last menstrual period?"       no  Protocols used: Leg Swelling and Edema-A-AH

## 2021-08-12 NOTE — Telephone Encounter (Signed)
Sent pt a MyChart message

## 2021-08-13 ENCOUNTER — Other Ambulatory Visit (HOSPITAL_COMMUNITY): Payer: Self-pay

## 2021-08-13 ENCOUNTER — Other Ambulatory Visit: Payer: Self-pay | Admitting: Internal Medicine

## 2021-08-13 DIAGNOSIS — E0590 Thyrotoxicosis, unspecified without thyrotoxic crisis or storm: Secondary | ICD-10-CM | POA: Insufficient documentation

## 2021-08-13 MED ORDER — PROPRANOLOL HCL 10 MG PO TABS
10.0000 mg | ORAL_TABLET | Freq: Two times a day (BID) | ORAL | 1 refills | Status: DC
  Filled 2021-08-13: qty 60, 30d supply, fill #0
  Filled 2021-09-17: qty 60, 30d supply, fill #1

## 2021-08-13 MED ORDER — METHIMAZOLE 10 MG PO TABS
10.0000 mg | ORAL_TABLET | Freq: Two times a day (BID) | ORAL | 1 refills | Status: DC
  Filled 2021-08-13: qty 60, 30d supply, fill #0
  Filled 2021-09-17: qty 60, 30d supply, fill #1

## 2021-08-14 ENCOUNTER — Telehealth: Payer: Self-pay | Admitting: Internal Medicine

## 2021-08-14 DIAGNOSIS — E0590 Thyrotoxicosis, unspecified without thyrotoxic crisis or storm: Secondary | ICD-10-CM

## 2021-08-14 NOTE — Telephone Encounter (Signed)
Phone call placed to patient yesterday afternoon to go over the results of her thyroid function test.  Patient informed that the rest of the results confirms that she has hyperthyroidism and looks like it is Graves' disease.  Went over diagnosis with her.  I recommend starting Tapazole 10 mg twice a day and propranolol in an effort to control her symptoms and try to get the thyroid level to normalize.  I request that after she has been on the medication for at least 4 to 6 weeks, she returns to the lab to have her thyroid level rechecked.  She will need a thyroid uptake study but I will defer on that to endocrinology.  Referral has already been submitted to the endocrinologist.  All questions were answered.

## 2021-08-16 ENCOUNTER — Other Ambulatory Visit (HOSPITAL_COMMUNITY): Payer: Self-pay

## 2021-08-16 ENCOUNTER — Telehealth: Payer: Self-pay | Admitting: *Deleted

## 2021-08-16 NOTE — Telephone Encounter (Signed)
Sandra Miller left a message stating she was recently diagnosed with hyperthyroidism. The MD started her on Tapazole. She wants to know if it is OK to take Tapazole as she is HER-2 positive.

## 2021-08-17 ENCOUNTER — Telehealth: Payer: Self-pay | Admitting: Internal Medicine

## 2021-08-17 NOTE — Telephone Encounter (Signed)
Provider has spoken with pt

## 2021-08-17 NOTE — Telephone Encounter (Signed)
Copied from [place] [id number]. Topic: General - Other >> Aug 13, 2021  1:31 PM Valere Dross wrote: Reason for CRM: Pt called in to return call to PCP about getting her Labs, I was not able to get through to the office, and pt requested if someone can give her a call back, please advise.

## 2021-08-19 ENCOUNTER — Encounter: Payer: PRIVATE HEALTH INSURANCE | Admitting: General Practice

## 2021-08-24 ENCOUNTER — Other Ambulatory Visit (HOSPITAL_COMMUNITY): Payer: Self-pay

## 2021-08-24 MED FILL — Rosuvastatin Calcium Tab 10 MG: ORAL | 90 days supply | Qty: 90 | Fill #2 | Status: AC

## 2021-08-24 MED FILL — Lisinopril Tab 20 MG: ORAL | 90 days supply | Qty: 45 | Fill #2 | Status: AC

## 2021-08-31 NOTE — Progress Notes (Unsigned)
Gynecologic Oncology Return Clinic Visit  09/01/21  Reason for Visit: follow-up in the setting of vulvar cancer  Treatment History: Oncology History  Vulvar cancer ([place])  01/29/2020 Initial Diagnosis   Vulvar cancer ([place])   03/11/2020 - 05/04/2020 Chemotherapy   The patient had dexamethasone (DECADRON) 4 MG tablet, 4 mg (100 % of original dose 4 mg), Oral, Daily, 1 of 1 cycle, Start date: 02/27/2020, End date: 05/05/2020 Dose modification: 4 mg (original dose 4 mg, Cycle 0) palonosetron (ALOXI) injection 0.25 mg, 0.25 mg, Intravenous,  Once, 1 of 1 cycle Administration: 0.25 mg (03/11/2020), 0.25 mg (03/17/2020), 0.25 mg (03/24/2020), 0.25 mg (03/31/2020), 0.25 mg (04/07/2020), 0.25 mg (04/14/2020), 0.25 mg (04/21/2020) CARBOplatin (PARAPLATIN) 300 mg in sodium chloride 0.9 % 250 mL chemo infusion, 300 mg (100 % of original dose 300 mg), Intravenous,  Once, 1 of 1 cycle Dose modification:   (original dose 300 mg, Cycle 1) Administration: 300 mg (03/11/2020), 290 mg (03/17/2020), 300 mg (03/24/2020), 230 mg (03/31/2020), 230 mg (04/07/2020), 230 mg (04/14/2020) PACLitaxel (TAXOL) 108 mg in sodium chloride 0.9 % 250 mL chemo infusion (</= $RemoveBefor'80mg'BSosMjANeExR$ /m2), 50 mg/m2 = 108 mg, Intravenous,  Once, 1 of 1 cycle Dose modification: 45 mg/m2 (original dose 50 mg/m2, Cycle 1, Reason: Dose not tolerated) Administration: 108 mg (03/11/2020), 108 mg (03/17/2020), 108 mg (03/24/2020), 96 mg (03/31/2020), 96 mg (04/07/2020), 96 mg (04/14/2020) fosaprepitant (EMEND) 150 mg in sodium chloride 0.9 % 145 mL IVPB, 150 mg, Intravenous,  Once, 1 of 1 cycle Administration: 150 mg (03/24/2020), 150 mg (03/31/2020), 150 mg (04/07/2020), 150 mg (04/14/2020) trastuzumab-anns (KANJINTI) 378 mg in sodium chloride 0.9 % 250 mL chemo infusion, 4 mg/kg = 378 mg (100 % of original dose 4 mg/kg), Intravenous,  Once, 1 of 1 cycle Dose modification: 4 mg/kg (original dose 4 mg/kg, Cycle 1, Reason: Other (see comments), Comment: loading dose) Administration: 378 mg  (03/11/2020), 189 mg (03/17/2020), 189 mg (03/24/2020), 189 mg (03/31/2020), 189 mg (04/07/2020), 189 mg (04/14/2020), 189 mg (04/21/2020), 189 mg (05/04/2020)   for chemotherapy treatment.     03/16/2020 - 05/08/2020 Radiation Therapy   Patient received 45 Gy in 25 treatments to pelvis Boost received 16.2 Gy in 9 treatments to vulva    Genetic Testing   Patient has genetic testing done for 06/22/2020. Results revealed patient has the following mutation(s): BRIP 1 mutation   Malignant neoplasm of upper-inner quadrant of left breast in female, estrogen receptor positive ([place])  02/20/2020 Initial Diagnosis   PET scan on 02/07/20 following a diagnosis of vulvar cancer showed a 1.7cm left breast mass and mildly hypermetabolic left axillary lymph nodes. Mammogram and [place] on 02/18/20 showed a 3.1cm left breast mass at the 11 o'clock position, palpable on exam, and a single abnormal left axillary lymph node with cortical thickening. Left breast biopsy on 02/20/20 showed invasive and in situ carcinoma in the breast and axilla, grade 3, HER-2 positive (3+), ER/PR+ 95%, Ki67 75%   02/25/2020 Cancer Staging   Staging form: Breast, AJCC 8th Edition - Clinical stage from 02/25/2020: Stage IB (cT2, cN1, cM0, G3, ER+, PR+, HER2+) - Signed by Nicholas Lose, MD on 02/25/2020    05/29/2020 - 05/29/2020 Chemotherapy   The patient had palonosetron (ALOXI) injection 0.25 mg, 0.25 mg, Intravenous,  Once, 1 of 3 cycles Administration: 0.25 mg (05/29/2020) pegfilgrastim-jmdb (FULPHILA) injection 6 mg, 6 mg, Subcutaneous,  Once, 1 of 3 cycles CARBOplatin (PARAPLATIN) 580 mg in sodium chloride 0.9 % 250 mL chemo infusion, 580 mg (100 %  of original dose 580 mg), Intravenous,  Once, 1 of 3 cycles Dose modification:   (original dose 580 mg, Cycle 1) Administration: 580 mg (05/29/2020) DOCEtaxel (TAXOTERE) 120 mg in sodium chloride 0.9 % 250 mL chemo infusion, 60 mg/m2 = 120 mg (100 % of original dose 60 mg/m2), Intravenous,  Once, 1 of 3  cycles Dose modification: 60 mg/m2 (original dose 60 mg/m2, Cycle 1, Reason: Provider Judgment), 50 mg/m2 (original dose 60 mg/m2, Cycle 2, Reason: Dose not tolerated) Administration: 120 mg (05/29/2020) fosaprepitant (EMEND) 150 mg in sodium chloride 0.9 % 145 mL IVPB, 150 mg, Intravenous,  Once, 1 of 3 cycles Administration: 150 mg (05/29/2020) pertuzumab (PERJETA) 420 mg in sodium chloride 0.9 % 250 mL chemo infusion, 420 mg (50 % of original dose 840 mg), Intravenous, Once, 1 of 3 cycles Dose modification: 420 mg (original dose 840 mg, Cycle 1, Reason: Provider Judgment) Administration: 420 mg (05/29/2020) trastuzumab-dkst (OGIVRI) 525 mg in sodium chloride 0.9 % 250 mL chemo infusion, 6 mg/kg = 525 mg (75 % of original dose 8 mg/kg), Intravenous,  Once, 1 of 3 cycles Dose modification: 6 mg/kg (original dose 8 mg/kg, Cycle 1, Reason: Provider Judgment) Administration: 525 mg (05/29/2020)   for chemotherapy treatment.     06/22/2020 Genetic Testing   Positive genetic testing: Heterozygous pathogenic variant detected in BRIP1 gene at c.2010dup (p.Glu671*).  Variant of uncertain significance detected in POLD1 at c.532G>A (p.Gly178Arg).  No other pathogenic variants detected in [facility] Common Hereditary Cancers Panel.  The report date is June 22, 2020.   The Common Hereditary Cancers Panel offered by [facility] includes sequencing and/or deletion duplication testing of the following 48 genes: APC, ATM, AXIN2, BARD1, BMPR1A, BRCA1, BRCA2, BRIP1, CDH1, CDK4, CDKN2A (p14ARF), CDKN2A (p16INK4a), CHEK2, CTNNA1, DICER1, EPCAM (Deletion/duplication testing only), GREM1 (promoter region deletion/duplication testing only), KIT, MEN1, MLH1, MSH2, MSH3, MSH6, MUTYH, NBN, NF1, NHTL1, PALB2, PDGFRA, PMS2, POLD1, POLE, PTEN, RAD50, RAD51C, RAD51D, RNF43, SDHB, SDHC, SDHD, SMAD4, SMARCA4. STK11, TP53, TSC1, TSC2, and VHL.  The following genes were evaluated for sequence changes only: SDHA and HOXB13 c.251G>A variant  only.   07/27/2020 Surgery   Left mastectomy [name]): three foci of IDC, 3.7cm, 1.5cm, and 1.2cm, with intermediate to high grade DCIS, clear margins, with 1/2 left axillary lymph nodes positive for carcinoma.    08/18/2020 -  Chemotherapy    Patient is on Treatment Plan: BREAST ADO-TRASTUZUMAB EMTANSINE [facility]) Q21D       09/09/2020 - 10/23/2020 Radiation Therapy   Adjuvant radiation    Ms Nicollette Wilhelmi is a 45 year old parous woman with a history of vulvar cancer who was seen in consultation at the request of Dr Lindi Adie for evaluation of a deleterious mutation in [place].   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  At the time of seeking consultation for sentinel lymph node biopsy with Dr. Thurston Pounds at [facility], it was determined that she was not a good candidate for primary radical vulvectomy due to the close proximity of the lesion to her anal sphincter.  Therefore she was treated with definitive external beam radiation to the lower pelvis and vulvar tissues.  Radiation dosing was between 03/16/20 through 05/08/20. It consisted of IMRT to the pelvis (45 Gy) and IMRT boost to the vulva (16.2 Gy).   Simultaneous to her diagnosis of vulvar cancer was a diagnosis of ER/PR positive  breast cancer on the left.  This was stage II with positive lymph node.  She underwent treatment with Adriamycin and Taxotere followed by left mastectomy, trastuzumab infusion for HER-2 positive lesion, and was planned for chest wall radiation beginning in the new year of 2022.   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   Screening ultrasound of the pelvis on 11/06/2020 revealed a uterus measuring 9.1 x 3.3 x 4.3 cm with an endometrial thickness of 6 mm and bilateral ovaries  which were normal in size and appearance.   On 05/18/21 she underwent robotic assisted total hysterectomy, BSO and vulvar biopsy. Intraoperative findings were significant for complete clinical response at vulva, normal appearing tubes and ovaries, normal appearing fibroid uterus. Surgery was uncomplicated.  Final pathology revealed benign tubes , ovaries, uterus. VIN 1 at vulva.   Since surgery she has done well with no major complaints.   Interval History: ***  The patient saw Dr. Sondra Come on 10/27, was NED at that time.   Past Medical/Surgical History: Past Medical History:  Diagnosis Date   Arthritis    Bipolar 1 disorder ([place])    Cancer ([place])    Complication of anesthesia    wakes up during procedures   Depression    GAD (generalized anxiety disorder)    Genital HSV    currently per pt  no break out 03-22-2016    GERD (gastroesophageal reflux disease)    Hiatal hernia    History of cervical dysplasia    2012 laser ablation   History of esophageal dilatation    for dysphasia -- x2 dilated   History of gastric ulcer    History of Helicobacter pylori infection    remote hx   History of hidradenitis suppurativa    "gets all over body intermittantly"     History of hypertension    no issue since stopped drinking alcohol 2014   History of kidney stones    History of panic attacks    History of radiation therapy 03/16/2020-05/08/2020   vulva  Dr Gery Pray   History of radiation therapy 09/09/2020-10/23/2020   left chest wall/left SCV   Dr Gery Pray   Hypertension    Iron deficiency anemia    Left ureteral stone    OCD (obsessive compulsive disorder)    PONV (postoperative nausea and vomiting)    Pre-diabetes    PTSD (post-traumatic stress disorder)    Recovering alcoholic in remission ([place])    since 2014   RLS (restless legs syndrome)    Smokers' cough ([place])    Urgency of urination    Yeast infection involving the vagina and surrounding area    secondary to taking  antibiotic    Past Surgical History:  Procedure Laterality Date   [place]   w/  Bilateral Tubal Ligation   COLONOSCOPY  last one 08-09-2013   CYSTOSCOPY W/ URETERAL STENT PLACEMENT Left 03/29/2016   Procedure: CYSTOSCOPY WITH STENT REPLACEMENT;  Surgeon: Nickie Retort, MD;  Location[facility];  Service: Urology;  Laterality: Left;   CYSTOSCOPY WITH RETROGRADE PYELOGRAM, URETEROSCOPY AND STENT PLACEMENT Left 03/08/2016   Procedure: CYSTOSCOPY WITH  LEFT RETROGRADE PYELOGRAM, AND STENT PLACEMENT;  Surgeon: Nickie Retort, MD;  Location: [facility];  Service: Urology;  Laterality: Left;   CYSTOSCOPY/RETROGRADE/URETEROSCOPY/STONE EXTRACTION WITH BASKET Left 03/29/2016   Procedure: CYSTOSCOPY/RETROGRADE/URETEROSCOPY/STONE EXTRACTION  WITH BASKET;  Surgeon: Nickie Retort, MD;  Location: [facility];  Service: Urology;  Laterality: Left;   ENDOMETRIAL ABLATION W/ NOVASURE  04-01-2010   ESOPHAGOGASTRODUODENOSCOPY  last one 08-09-2013   KNEE ARTHROSCOPY Left as teen   LASER ABLATION OF THE CERVIX  2012 approx   MASTECTOMY WITH AXILLARY LYMPH NODE DISSECTION Left 07/27/2020   Procedure: LEFT MASTECTOMY WITH LEFT RADIOACTIVE SEED GUIDED TARGETED AXILLARY LYMPH NODE DISSECTION;  Surgeon: Alphonsa Overall, MD;  Location: [place];  Service: General;  Laterality: Left;   PORT-A-CATH REMOVAL Right 05/18/2021   Procedure: REMOVAL PORT-A-CATH;  Surgeon: Donnie Mesa, MD;  Location: [facility];  Service: General;  Laterality: Right;   PORTACATH PLACEMENT Right 03/06/2020   Procedure: INSERTION PORT-A-CATH WITH ULTRASOUND GUIDANCE;  Surgeon: Alphonsa Overall, MD;  Location: [place];  Service: General;  Laterality: Right;   ROBOTIC ASSISTED TOTAL HYSTERECTOMY WITH BILATERAL SALPINGO OOPHERECTOMY N/A 05/18/2021   Procedure: XI ROBOTIC ASSISTED TOTAL HYSTERECTOMY WITH BILATERAL SALPINGO OOPHORECTOMY;  Surgeon: Everitt Amber, MD;  Location: [facility];  Service:  Gynecology;  Laterality: N/A;   TRANSTHORACIC ECHOCARDIOGRAM  05-19-2006   lvsf normal, ef 55-65%, there was mild flattening of the interventricular septum during diastoli/  RV size at upper limits normal   TUBAL LIGATION     VULVA /PERINEUM BIOPSY N/A 05/18/2021   Procedure: VULVAR BIOPSY;  Surgeon: Everitt Amber, MD;  Location: [facility];  Service: Gynecology;  Laterality: N/A;   WISDOM TOOTH EXTRACTION  age 47 's    Family History  Problem Relation Age of Onset   Heart disease Father    Lung cancer Father        d. 88   Alcohol abuse Father    Heart disease Mother    Depression Mother    Anxiety disorder Mother    Drug abuse Brother    Alcohol abuse Brother    Drug abuse Brother    ADD / ADHD Brother    Colon polyps Brother    Cancer Paternal Grandfather        "stomach"   Diabetes Maternal Grandfather    Diabetes Paternal Grandmother    Kidney disease Maternal Uncle    Cirrhosis Cousin        alcoholic   Anxiety disorder Maternal Aunt    Depression Maternal Aunt    Cancer Cousin        maternal; ovarian cancer or other "female" cancer?   Lung cancer Paternal Uncle 40   Throat cancer Cousin        paternal; dx 55s   Lung cancer Cousin        paternal; dx 46s    Social History   Socioeconomic History   Marital status: Single    Spouse name: Not on file   Number of children: Not on file   Years of education: Not on file   Highest education level: Not on file  Occupational History   Not on file  Tobacco Use   Smoking status: Every Day    Packs/day: 0.50    Years: 22.00    Pack years: 11.00    Types: Cigarettes   Smokeless tobacco: Never   Tobacco comments:    Recently started a smoking cessation class.   Vaping Use   Vaping Use: Never used  Substance and Sexual Activity   Alcohol use: Not Currently    Alcohol/week: 0.0 standard drinks    Comment:  hx alcohollism  in remission since 2014  Drug use: Yes    Types: Marijuana    Comment: occas.   Sexual  activity: Yes    Partners: Male    Birth control/protection: Surgical  Other Topics Concern   Not on file  Social History Narrative   Former [facility] patient.      Was on disability at one point.   Return to the workforce.  40 hours a week at [employer], 10 hours a week on the weekends at [employer] in [place] at night 10 to 12 hours a week.      Has grown children, she lives alone with a pet, continues to smoke no alcohol or drug use at this time      History of EtOH abuse and THC use.         Social Determinants of Health   Financial Resource Strain: Low Risk    Difficulty of Paying Living Expenses: Not hard at all  Food Insecurity: No Food Insecurity   Worried About Charity fundraiser in the Last Year: Never true   [place] in the Last Year: Never true  Transportation Needs: No Transportation Needs   Lack of Transportation (Medical): No   Lack of Transportation (Non-Medical): No  Physical Activity: Inactive   Days of Exercise per Week: 0 days   Minutes of Exercise per Session: 0 min  Stress: Stress Concern Present   Feeling of Stress : Rather much  Social Connections: Socially Isolated   Frequency of Communication with Friends and Family: More than three times a week   Frequency of Social Gatherings with Friends and Family: More than three times a week   Attends Religious Services: Never   Marine scientist or Organizations: No   Attends Archivist Meetings: Never   Marital Status: Never married    Current Medications:  Current Outpatient Medications:    acetaminophen (TYLENOL) 500 MG tablet, Take 2 tablets (1,000 mg total) by mouth every 6 (six) hours as needed for moderate pain or fever. Avoid tylenol until your liver function back to normal, Disp: 30 tablet, Rfl: 0   Cholecalciferol (VITAMIN D3) 125 MCG (5000 UT) CAPS, Take by mouth., Disp: , Rfl:    diclofenac Sodium (VOLTAREN) 1 % GEL, Apply 4 grams topically 4   times daily as needed., Disp: 500 g, Rfl: 6   diphenoxylate-atropine (LOMOTIL) 2.5-0.025 MG tablet, TAKE 1 TO 2 TABLETS BY MOUTH FOUR TIMES DAILY AS NEEDED FOR DIARRHEA (Patient taking differently: Take 1-2 tablets by mouth 4 (four) times daily as needed for diarrhea or loose stools. TAKE 1 TO 2 TABLETS BY MOUTH FOUR TIMES DAILY AS NEEDED FOR DIARRHEA), Disp: 45 tablet, Rfl: 1   doxylamine, Sleep, (UNISOM) 25 MG tablet, Take 25 mg by mouth at bedtime as needed for sleep., Disp: , Rfl:    gabapentin (NEURONTIN) 300 MG capsule, Take 1 capsule (300 mg total) by mouth daily., Disp: 30 capsule, Rfl: 5   Glucosamine 750 MG TABS, Take 750 mg by mouth in the morning and at bedtime., Disp: , Rfl:    lisinopril (ZESTRIL) 20 MG tablet, TAKE 1/2 TABLET BY MOUTH ONCE A DAY, Disp: 90 tablet, Rfl: 3   loratadine (CLARITIN) 10 MG tablet, Take 10 mg by mouth daily., Disp: , Rfl:    LORazepam (ATIVAN) 1 MG tablet, Take 1 tablet by mouth 3 times daily as needed for anxiety., Disp: 90 tablet, Rfl: 2   magnesium oxide (MAG-OX) 400 (241.3 Mg) MG  tablet, Take 1 tablet (400 mg total) by mouth 2 (two) times daily., Disp: 60 tablet, Rfl: 1   methimazole (TAPAZOLE) 10 MG tablet, Take 1 tablet (10 mg total) by mouth 2 (two) times daily., Disp: 60 tablet, Rfl: 1   Multiple Vitamin (MULTIVITAMIN WITH MINERALS) TABS tablet, Take 1 tablet by mouth daily., Disp: , Rfl:    nicotine (NICODERM CQ) 21 mg/24hr patch, Place 1 patch (21 mg total) onto the skin daily., Disp: 28 patch, Rfl: 0   OLANZapine (ZYPREXA) 7.5 MG tablet, Take 1 tablet (7.5 mg total) by mouth at bedtime., Disp: 30 tablet, Rfl: 2   omeprazole (PRILOSEC) 40 MG capsule, TAKE 1 CAPSULE BY MOUTH EVERY MORNING AND EVERY NIGHT AT BEDTIME, Disp: 60 capsule, Rfl: 3   promethazine (PHENERGAN) 25 MG tablet, TAKE 1 TABLET BY MOUTH EVERY 6 HOURS AS NEEDED FOR NAUSEA, Disp: 30 tablet, Rfl: 6   propranolol (INDERAL) 10 MG tablet, Take 1 tablet (10 mg total) by mouth 2 (two) times  daily., Disp: 60 tablet, Rfl: 1   rivaroxaban (XARELTO) 20 MG TABS tablet, TAKE 1 TABLET BY MOUTH DAILY WITH SUPPER., Disp: 30 tablet, Rfl: 6   rosuvastatin (CRESTOR) 10 MG tablet, TAKE 1 TABLET BY MOUTH ONCE A DAY, Disp: 90 tablet, Rfl: 3   senna-docusate (SENOKOT-S) 8.6-50 MG tablet, Take 2 tablets by mouth at bedtime. For AFTER surgery only, do not take if having diarrhea (Patient not taking: Reported on 08/06/2021), Disp: 30 tablet, Rfl: 0   Turmeric 450 MG CAPS, Take 450 mg by mouth in the morning and at bedtime., Disp: , Rfl:    venlafaxine XR (EFFEXOR-XR) 37.5 MG 24 hr capsule, TAKE 3 CAPSULES BY MOUTH ONCE A DAY, Disp: 90 capsule, Rfl: 2 No current facility-administered medications for this visit.  Facility-Administered Medications Ordered in Other Visits:    LORazepam (ATIVAN) injection 0.5 mg, 0.5 mg, Intravenous, Once, [place]anner, Kathrin Greathouse., PA-C  Review of Systems: Denies appetite changes, fevers, chills, fatigue, unexplained weight changes. Denies hearing loss, neck lumps or masses, mouth sores, ringing in ears or voice changes. Denies cough or wheezing.  Denies shortness of breath. Denies chest pain or palpitations. Denies leg swelling. Denies abdominal distention, pain, blood in stools, constipation, diarrhea, nausea, vomiting, or early satiety. Denies pain with intercourse, dysuria, frequency, hematuria or incontinence. Denies hot flashes, pelvic pain, vaginal bleeding or vaginal discharge.   Denies joint pain, back pain or muscle pain/cramps. Denies itching, rash, or wounds. Denies dizziness, headaches, numbness or seizures. Denies swollen lymph nodes or glands, denies easy bruising or bleeding. Denies anxiety, depression, confusion, or decreased concentration.  Physical Exam: There were no vitals taken for this visit. General: ***Alert, oriented, no acute distress. HEENT: ***Posterior oropharynx clear, sclera anicteric. Chest: ***Clear to auscultation bilaterally.  ***Port site  clean. Cardiovascular: ***Regular rate and rhythm, no murmurs. Abdomen: ***Obese, soft, nontender.  Normoactive bowel sounds.  No masses or hepatosplenomegaly appreciated.  ***Well-healed scar. Extremities: ***Grossly normal range of motion.  Warm, well perfused.  No edema bilaterally. Skin: ***No rashes or lesions noted. Lymphatics: ***No cervical, supraclavicular, or inguinal adenopathy. GU: Normal appearing external genitalia without erythema, excoriation, or lesions.  Speculum exam reveals ***.  Bimanual exam reveals ***.  ***Rectovaginal exam  confirms ___.  Laboratory & Radiologic Studies: ***  Assessment & Plan: ANWITHA MAPES is a 45 y.o. woman with a history of stage IB vulvar cancer s/p definitive radiation, s/p diagnosis of HR positive left breast cancer, and deleterious mutation in BRIP1.  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 S/p primary radiation completed 05/08/20 with external beam to the pelvis (45 Gy) and boost to the vulva (16.2 Gy). Complete clinical and pathologic response.  Recommend vulva inspection at 3 monthly intervals until September, 2023.   2/ Deleterious mutation in BRIP 1 conferring increased hereditary risk of ovarian cancer. S/p robotic assisted total hysterectomy with BSO for risk reduction in September, 2022.     ***  *** minutes of total time was spent for this patient encounter, including preparation, face-to-face counseling with the patient and coordination of care, and documentation of the encounter.  Jeral Pinch, MD  Division of Gynecologic Oncology  Department of Obstetrics and Gynecology  [facility]

## 2021-09-01 ENCOUNTER — Inpatient Hospital Stay: Payer: PRIVATE HEALTH INSURANCE | Admitting: Gynecologic Oncology

## 2021-09-01 ENCOUNTER — Other Ambulatory Visit (HOSPITAL_COMMUNITY): Payer: Self-pay

## 2021-09-01 ENCOUNTER — Telehealth: Payer: Self-pay

## 2021-09-01 NOTE — Telephone Encounter (Signed)
Patient requesting to reschedule appt for today. New appt made for 09/17/21 at 1:15 pm. Pt agreeable to date and time of new appt.

## 2021-09-02 ENCOUNTER — Other Ambulatory Visit (HOSPITAL_COMMUNITY): Payer: Self-pay

## 2021-09-03 LAB — T4, FREE: Free T4: 4.86 ng/dL — ABNORMAL HIGH (ref 0.82–1.77)

## 2021-09-03 LAB — T3, FREE: T3, Free: 21.7 pg/mL (ref 2.0–4.4)

## 2021-09-03 LAB — THYROID STIMULATING IMMUNOGLOBULIN: Thyroid Stim Immunoglobulin: 4.13 IU/L — ABNORMAL HIGH (ref 0.00–0.55)

## 2021-09-03 LAB — SPECIMEN STATUS REPORT

## 2021-09-07 ENCOUNTER — Encounter: Payer: Self-pay | Admitting: General Practice

## 2021-09-07 NOTE — Progress Notes (Unsigned)
[facility] Spiritual Care Note  Left voicemail encouraging return call for information about additional layers of support.  Plan to encourage asking Dr [name] Schilder for a counseling referral within the practice and then pursuing cancer-specific support through support groups. We have both breast and gyn available here at [facility].   [place], [place], [facility] Pager [phone] Voicemail [phone]

## 2021-09-09 ENCOUNTER — Other Ambulatory Visit: Payer: Self-pay | Admitting: *Deleted

## 2021-09-09 DIAGNOSIS — C50212 Malignant neoplasm of upper-inner quadrant of left female breast: Secondary | ICD-10-CM

## 2021-09-09 NOTE — Progress Notes (Signed)
Pt insurance requiring mammograms to be preformed at [facility].  RN successfully faxed order to [phone].

## 2021-09-13 ENCOUNTER — Telehealth: Payer: Self-pay | Admitting: *Deleted

## 2021-09-13 NOTE — Telephone Encounter (Signed)
Copied from [place] [phone]. Topic: General - Inquiry >> Sep 13, 2021 11:25 AM Greggory Keen D wrote: Reason for CRM: Pt is scheduled for a covid Booster tomorrow .  Pt called saying she was diagnosed with Graves dx recently and does not know whether she should get it or not.  She said nurse could leave a message if she does not answer.  CB#  [phone]

## 2021-09-14 ENCOUNTER — Other Ambulatory Visit: Payer: Self-pay

## 2021-09-14 ENCOUNTER — Encounter: Payer: Self-pay | Admitting: Gynecologic Oncology

## 2021-09-14 ENCOUNTER — Encounter (HOSPITAL_COMMUNITY): Payer: Self-pay | Admitting: Psychiatry

## 2021-09-14 ENCOUNTER — Telehealth (HOSPITAL_BASED_OUTPATIENT_CLINIC_OR_DEPARTMENT_OTHER): Payer: Self-pay | Admitting: Psychiatry

## 2021-09-14 ENCOUNTER — Other Ambulatory Visit (HOSPITAL_COMMUNITY): Payer: Self-pay

## 2021-09-14 DIAGNOSIS — F411 Generalized anxiety disorder: Secondary | ICD-10-CM

## 2021-09-14 DIAGNOSIS — F319 Bipolar disorder, unspecified: Secondary | ICD-10-CM

## 2021-09-14 MED ORDER — OLANZAPINE 7.5 MG PO TABS
7.5000 mg | ORAL_TABLET | Freq: Every day | ORAL | 2 refills | Status: DC
  Filled 2021-09-14 – 2021-10-01 (×2): qty 30, 30d supply, fill #0
  Filled 2021-11-09: qty 30, 30d supply, fill #1

## 2021-09-14 MED ORDER — LORAZEPAM 1 MG PO TABS
1.0000 mg | ORAL_TABLET | Freq: Three times a day (TID) | ORAL | 2 refills | Status: DC | PRN
  Filled 2021-09-14: qty 90, 30d supply, fill #0
  Filled 2021-10-10: qty 90, 30d supply, fill #1
  Filled 2021-11-15: qty 90, 30d supply, fill #2

## 2021-09-14 MED ORDER — VENLAFAXINE HCL ER 37.5 MG PO CP24
ORAL_CAPSULE | ORAL | 2 refills | Status: DC
  Filled 2021-09-14: qty 90, 30d supply, fill #0
  Filled 2021-10-10: qty 90, 30d supply, fill #1
  Filled 2021-11-15: qty 90, 30d supply, fill #2

## 2021-09-14 NOTE — Telephone Encounter (Signed)
Sent pt a MyChart message

## 2021-09-14 NOTE — Progress Notes (Signed)
Virtual Visit via Telephone Note  I connected with [place] on 09/14/21 at  9:40 AM EST by telephone and verified that I am speaking with the correct person using two identifiers.  Location: Patient: Home Provider: Home Office   I discussed the limitations, risks, security and privacy concerns of performing an evaluation and management service by telephone and the availability of in person appointments. I also discussed with the patient that there may be a patient responsible charge related to this service. The patient expressed understanding and agreed to proceed.   History of Present Illness: Patient is evaluated by phone session.  She is still struggling for her physical symptoms and illness.  Now she diagnosed with Graves' disease and she is having heart palpitation, weight loss and feeling tired.  She is waiting to see the endocrinologist.  She is taking propanolol to help with increased heart rate.  We have tried gabapentin higher dose on the last visit to help her sleep and anxiety but she is back on 300 mg as she is not sure if higher dose of gabapentin making her increased heart rate.  Overall she is handling current depression better than she anticipated.  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  Had Christmas as good as she was able to see family member.  Her mother lives with her who is very supportive.  Her blood sugars is stable with hemoglobin A1c 6.1.  Patient denies any mania, psychosis, hallucination but occasionally she gets angry and frustrated.  She denies any homicidal or suicidal thoughts.  She is concerned about her weight loss but now she has a diagnosis of Graves' disease which explains about her physical symptoms.  She is compliant with olanzapine, Ativan and venlafaxine.  She has mammogram coming up and she is hoping to have a normal report as patient was told that she is in remission since August from her cancer.  She has no  tremor or shakes.   Past Psychiatric History:  H/O bipolar disorder, alcohol dependence and marijuana abuse.  H/O at least 6 inpatient. Tried lithium, Seroquel, Lamictal, Paxil, Celexa, Abilify, Geodon, Klonopin, Depakote, Neurontin and Cymbalta.  No H/o history of suicidal attempt.   Recent Results (from the past 2160 hour(s))  Comprehensive metabolic panel     Status: Abnormal   Collection Time: 07/01/21  9:26 AM  Result Value Ref Range   Sodium 139 135 - 145 mmol/L   Potassium 3.9 3.5 - 5.1 mmol/L   Chloride 104 98 - 111 mmol/L   CO2 24 22 - 32 mmol/L   Glucose, Bld 102 (H) 70 - 99 mg/dL    Comment: Glucose reference range applies only to samples taken after fasting for at least 8 hours.   BUN 14 6 - 20 mg/dL   Creatinine, Ser 0.67 0.44 - 1.00 mg/dL   Calcium 10.6 (H) 8.9 - 10.3 mg/dL   Total Protein 7.6 6.5 - 8.1 g/dL   Albumin 3.9 3.5 - 5.0 g/dL   AST 27 15 - 41 U/L   ALT 31 0 - 44 U/L   Alkaline Phosphatase 64 38 - 126 U/L   Total Bilirubin 0.6 0.3 - 1.2 mg/dL   GFR, Estimated >60 >60 mL/min    Comment: (NOTE) Calculated using the CKD-EPI Creatinine Equation (2021)    Anion gap 11 5 - 15    Comment: Performed at [facility], [address]  [place], [place] [address]  CBC with Differential ([facility] Only)     Status: Abnormal   Collection Time: 07/01/21  9:26 AM  Result Value Ref Range   WBC Count 7.1 4.0 - 10.5 K/uL   RBC 3.93 3.87 - 5.11 MIL/uL   Hemoglobin 11.3 (L) 12.0 - 15.0 g/dL   HCT 34.3 (L) 36.0 - 46.0 %   MCV 87.3 80.0 - 100.0 fL   MCH 28.8 26.0 - 34.0 pg   MCHC 32.9 30.0 - 36.0 g/dL   RDW 13.4 11.5 - 15.5 %   Platelet Count 296 150 - 400 K/uL   nRBC 0.0 0.0 - 0.2 %   Neutrophils Relative % 64 %   Neutro Abs 4.5 1.7 - 7.7 K/uL   Lymphocytes Relative 24 %   Lymphs Abs 1.7 0.7 - 4.0 K/uL   Monocytes Relative 11 %   Monocytes Absolute 0.8 0.1 - 1.0 K/uL   Eosinophils Relative 1 %   Eosinophils Absolute 0.1 0.0 - 0.5 K/uL    Basophils Relative 0 %   Basophils Absolute 0.0 0.0 - 0.1 K/uL   Immature Granulocytes 0 %   Abs Immature Granulocytes 0.02 0.00 - 0.07 K/uL    Comment: Performed at [facility], [place] [address], [place],  [address]  I-STAT creatinine     Status: None   Collection Time: 08/06/21  9:31 AM  Result Value Ref Range   Creatinine, Ser 0.50 0.44 - 1.00 mg/dL  POCT glucose (manual entry)     Status: Abnormal   Collection Time: 08/06/21  2:22 PM  Result Value Ref Range   POC Glucose 140 (A) 70 - 99 mg/dl  POCT glycosylated hemoglobin (Hb A1C)     Status: Normal   Collection Time: 08/06/21  2:22 PM  Result Value Ref Range   Hemoglobin A1C     HbA1c POC (<> result, manual entry)     HbA1c, POC (prediabetic range) 5.7 5.7 - 6.4 %   HbA1c, POC (controlled diabetic range)    TSH     Status: Abnormal   Collection Time: 08/06/21  3:18 PM  Result Value Ref Range   TSH <0.005 (L) 0.450 - 4.500 uIU/mL  T4, free     Status: Abnormal   Collection Time: 08/06/21  3:18 PM  Result Value Ref Range   Free T4 4.86 (H) 0.82 - 1.77 ng/dL  Thyroid stimulating immunoglobulin     Status: Abnormal   Collection Time: 08/06/21  3:18 PM  Result Value Ref Range   Thyroid Stim Immunoglobulin 4.13 (H) 0.00 - 0.55 IU/L  T3, free     Status: Abnormal   Collection Time: 08/06/21  3:18 PM  Result Value Ref Range   T3, Free 21.7 (HH) 2.0 - 4.4 pg/mL  Specimen status report     Status: None   Collection Time: 08/06/21  3:18 PM  Result Value Ref Range   specimen status report Comment     Comment: Written Authorization Written Authorization No Written Authorization Received.      Psychiatric Specialty Exam: Physical Exam  Review of Systems  Constitutional:  Positive for appetite change.  Cardiovascular:  Positive for palpitations.   Weight 180 lb (81.6 kg).There is no height or weight on file to calculate BMI.  General Appearance: NA  Eye Contact:  NA  Speech:  Normal Rate   Volume:  Normal  Mood:  Anxious  Affect:  NA  Thought Process:  Goal Directed  Orientation:  Full (Time, Place, and Person)  Thought Content:  Rumination  Suicidal Thoughts:  No  Homicidal Thoughts:  No  Memory:  Immediate;   Good Recent;   Good Remote;   Good  Judgement:  Intact  Insight:  Present  Psychomotor Activity:  NA  Concentration:  Concentration: Fair and Attention Span: Fair  Recall:  Good  Fund of Knowledge:  Good  Language:  Good  Akathisia:  No  Handed:  Right  AIMS (if indicated):     Assets:  Communication Skills Desire for Improvement Housing Social Support  ADL's:  Intact  Cognition:  WNL  Sleep:   fair      Assessment and Plan: Bipolar disorder type I.  Generalized anxiety disorder.  I reviewed blood work results.  She is diagnosed with Graves' disease and I will get a propranolol to help her heart rate.  She is waiting for her appointment with endocrinologist to address Graves' disease.  We discussed not to change the dose or add any medication since she is working with a physician to address her medical illness.  I will continue olanzapine 7.5 mg at bedtime, venlafaxine 112.5 mg daily and Ativan 1 mg 3 times a day.  Her last hemoglobin A1c is 6.1.  She is getting gabapentin 300 mg from her GI doctor.  I recommended to call us back if she is any question or any concern.  Follow-up in 3 months.  Follow Up Instructions:    I discussed the assessment and treatment plan with the patient. The patient was provided an opportunity to ask questions and all were answered. The patient agreed with the plan and demonstrated an understanding of the instructions.   The patient was advised to call back or seek an in-person evaluation if the symptoms worsen or if the condition fails to improve as anticipated.  I provided 26 minutes of non-face-to-face time during this encounter.   Kathlee Nations, MD

## 2021-09-17 ENCOUNTER — Other Ambulatory Visit (HOSPITAL_COMMUNITY): Payer: Self-pay

## 2021-09-17 ENCOUNTER — Telehealth: Payer: Self-pay

## 2021-09-17 ENCOUNTER — Inpatient Hospital Stay: Payer: PRIVATE HEALTH INSURANCE | Admitting: Gynecologic Oncology

## 2021-09-17 DIAGNOSIS — C519 Malignant neoplasm of vulva, unspecified: Secondary | ICD-10-CM

## 2021-09-17 NOTE — Telephone Encounter (Signed)
Received call from patient needing to reschedule appt for today due to the pt not feeling well. Appt rescheduled for 09/20/21 at 2:45 pm. Pt agreeable to new date and time of appt.

## 2021-09-20 ENCOUNTER — Telehealth: Payer: Self-pay

## 2021-09-20 ENCOUNTER — Ambulatory Visit: Payer: PRIVATE HEALTH INSURANCE | Admitting: Gynecologic Oncology

## 2021-09-20 NOTE — Telephone Encounter (Signed)
Patient states she is unable to make her appointment today at 2:45 and would like to reschedule. Patient prefers morning appointments due to her work schedule. Appointment rescheduled for 09/30/21 at [facility]. Patient is in agreement with appointment date and time.

## 2021-09-22 ENCOUNTER — Telehealth: Payer: Self-pay | Admitting: Clinical

## 2021-09-22 NOTE — Telephone Encounter (Signed)
I attempted to contact this pt per request for counseling resources. No answer left vm.

## 2021-09-24 ENCOUNTER — Ambulatory Visit: Payer: PRIVATE HEALTH INSURANCE | Attending: Internal Medicine

## 2021-09-24 ENCOUNTER — Other Ambulatory Visit: Payer: Self-pay

## 2021-09-27 ENCOUNTER — Other Ambulatory Visit: Payer: Self-pay

## 2021-09-27 ENCOUNTER — Ambulatory Visit: Payer: PRIVATE HEALTH INSURANCE | Attending: Internal Medicine

## 2021-09-27 DIAGNOSIS — Z23 Encounter for immunization: Secondary | ICD-10-CM | POA: Diagnosis not present

## 2021-09-27 DIAGNOSIS — Z117 Encounter for testing for latent tuberculosis infection: Secondary | ICD-10-CM

## 2021-09-29 ENCOUNTER — Other Ambulatory Visit: Payer: Self-pay

## 2021-09-29 ENCOUNTER — Ambulatory Visit: Payer: PRIVATE HEALTH INSURANCE | Admitting: *Deleted

## 2021-09-29 DIAGNOSIS — Z111 Encounter for screening for respiratory tuberculosis: Secondary | ICD-10-CM

## 2021-09-29 LAB — TB SKIN TEST
Induration: 0 mm
TB Skin Test: NEGATIVE

## 2021-09-30 ENCOUNTER — Encounter: Payer: Self-pay | Admitting: Hematology and Oncology

## 2021-09-30 ENCOUNTER — Inpatient Hospital Stay: Payer: PRIVATE HEALTH INSURANCE | Attending: Gynecologic Oncology | Admitting: Gynecologic Oncology

## 2021-09-30 VITALS — BP 139/81 | HR 98 | Temp 98.5°F | Resp 18 | Wt 184.2 lb

## 2021-09-30 DIAGNOSIS — Z90710 Acquired absence of both cervix and uterus: Secondary | ICD-10-CM | POA: Diagnosis not present

## 2021-09-30 DIAGNOSIS — Z7901 Long term (current) use of anticoagulants: Secondary | ICD-10-CM | POA: Diagnosis not present

## 2021-09-30 DIAGNOSIS — Z8544 Personal history of malignant neoplasm of other female genital organs: Secondary | ICD-10-CM

## 2021-09-30 DIAGNOSIS — R1030 Lower abdominal pain, unspecified: Secondary | ICD-10-CM

## 2021-09-30 DIAGNOSIS — K219 Gastro-esophageal reflux disease without esophagitis: Secondary | ICD-10-CM | POA: Insufficient documentation

## 2021-09-30 DIAGNOSIS — I10 Essential (primary) hypertension: Secondary | ICD-10-CM | POA: Insufficient documentation

## 2021-09-30 DIAGNOSIS — Z170 Estrogen receptor positive status [ER+]: Secondary | ICD-10-CM | POA: Insufficient documentation

## 2021-09-30 DIAGNOSIS — I700 Atherosclerosis of aorta: Secondary | ICD-10-CM | POA: Insufficient documentation

## 2021-09-30 DIAGNOSIS — K760 Fatty (change of) liver, not elsewhere classified: Secondary | ICD-10-CM | POA: Insufficient documentation

## 2021-09-30 DIAGNOSIS — F319 Bipolar disorder, unspecified: Secondary | ICD-10-CM | POA: Insufficient documentation

## 2021-09-30 DIAGNOSIS — Z90722 Acquired absence of ovaries, bilateral: Secondary | ICD-10-CM | POA: Diagnosis not present

## 2021-09-30 DIAGNOSIS — F17210 Nicotine dependence, cigarettes, uncomplicated: Secondary | ICD-10-CM | POA: Diagnosis not present

## 2021-09-30 DIAGNOSIS — C50212 Malignant neoplasm of upper-inner quadrant of left female breast: Secondary | ICD-10-CM | POA: Diagnosis not present

## 2021-09-30 DIAGNOSIS — Z9221 Personal history of antineoplastic chemotherapy: Secondary | ICD-10-CM | POA: Insufficient documentation

## 2021-09-30 DIAGNOSIS — Z79899 Other long term (current) drug therapy: Secondary | ICD-10-CM | POA: Insufficient documentation

## 2021-09-30 DIAGNOSIS — N898 Other specified noninflammatory disorders of vagina: Secondary | ICD-10-CM | POA: Diagnosis not present

## 2021-09-30 DIAGNOSIS — K429 Umbilical hernia without obstruction or gangrene: Secondary | ICD-10-CM | POA: Diagnosis not present

## 2021-09-30 DIAGNOSIS — F411 Generalized anxiety disorder: Secondary | ICD-10-CM | POA: Diagnosis not present

## 2021-09-30 DIAGNOSIS — R102 Pelvic and perineal pain: Secondary | ICD-10-CM | POA: Diagnosis not present

## 2021-09-30 DIAGNOSIS — C519 Malignant neoplasm of vulva, unspecified: Secondary | ICD-10-CM

## 2021-09-30 DIAGNOSIS — Z923 Personal history of irradiation: Secondary | ICD-10-CM | POA: Insufficient documentation

## 2021-09-30 DIAGNOSIS — E0590 Thyrotoxicosis, unspecified without thyrotoxic crisis or storm: Secondary | ICD-10-CM | POA: Insufficient documentation

## 2021-09-30 DIAGNOSIS — Z9012 Acquired absence of left breast and nipple: Secondary | ICD-10-CM | POA: Insufficient documentation

## 2021-09-30 NOTE — Progress Notes (Signed)
Gynecologic Oncology Return Clinic Visit  09/30/21  Reason for Visit: follow-up in the setting of vulvar cancer  Treatment History: Oncology History  Vulvar cancer ([place])  01/29/2020 Initial Diagnosis   Vulvar cancer ([place])   03/11/2020 - 05/04/2020 Chemotherapy   The patient had dexamethasone (DECADRON) 4 MG tablet, 4 mg (100 % of original dose 4 mg), Oral, Daily, 1 of 1 cycle, Start date: 02/27/2020, End date: 05/05/2020 Dose modification: 4 mg (original dose 4 mg, Cycle 0) palonosetron (ALOXI) injection 0.25 mg, 0.25 mg, Intravenous,  Once, 1 of 1 cycle Administration: 0.25 mg (03/11/2020), 0.25 mg (03/17/2020), 0.25 mg (03/24/2020), 0.25 mg (03/31/2020), 0.25 mg (04/07/2020), 0.25 mg (04/14/2020), 0.25 mg (04/21/2020) CARBOplatin (PARAPLATIN) 300 mg in sodium chloride 0.9 % 250 mL chemo infusion, 300 mg (100 % of original dose 300 mg), Intravenous,  Once, 1 of 1 cycle Dose modification:   (original dose 300 mg, Cycle 1) Administration: 300 mg (03/11/2020), 290 mg (03/17/2020), 300 mg (03/24/2020), 230 mg (03/31/2020), 230 mg (04/07/2020), 230 mg (04/14/2020) PACLitaxel (TAXOL) 108 mg in sodium chloride 0.9 % 250 mL chemo infusion (</= 52m/m2), 50 mg/m2 = 108 mg, Intravenous,  Once, 1 of 1 cycle Dose modification: 45 mg/m2 (original dose 50 mg/m2, Cycle 1, Reason: Dose not tolerated) Administration: 108 mg (03/11/2020), 108 mg (03/17/2020), 108 mg (03/24/2020), 96 mg (03/31/2020), 96 mg (04/07/2020), 96 mg (04/14/2020) fosaprepitant (EMEND) 150 mg in sodium chloride 0.9 % 145 mL IVPB, 150 mg, Intravenous,  Once, 1 of 1 cycle Administration: 150 mg (03/24/2020), 150 mg (03/31/2020), 150 mg (04/07/2020), 150 mg (04/14/2020) trastuzumab-anns (KANJINTI) 378 mg in sodium chloride 0.9 % 250 mL chemo infusion, 4 mg/kg = 378 mg (100 % of original dose 4 mg/kg), Intravenous,  Once, 1 of 1 cycle Dose modification: 4 mg/kg (original dose 4 mg/kg, Cycle 1, Reason: Other (see comments), Comment: loading dose) Administration: 378 mg  (03/11/2020), 189 mg (03/17/2020), 189 mg (03/24/2020), 189 mg (03/31/2020), 189 mg (04/07/2020), 189 mg (04/14/2020), 189 mg (04/21/2020), 189 mg (05/04/2020)   for chemotherapy treatment.     03/16/2020 - 05/08/2020 Radiation Therapy   Patient received 45 Gy in 25 treatments to pelvis Boost received 16.2 Gy in 9 treatments to vulva    Genetic Testing   Patient has genetic testing done for 06/22/2020. Results revealed patient has the following mutation(s): BRIP 1 mutation   Malignant neoplasm of upper-inner quadrant of left breast in female, estrogen receptor positive ([place]  02/20/2020 Initial Diagnosis   PET scan on 02/07/20 following a diagnosis of vulvar cancer showed a 1.7cm left breast mass and mildly hypermetabolic left axillary lymph nodes. Mammogram and [facility] 02/18/20 showed a 3.1cm left breast mass at the 11 o'clock position, palpable on exam, and a single abnormal left axillary lymph node with cortical thickening. Left breast biopsy on 02/20/20 showed invasive and in situ carcinoma in the breast and axilla, grade 3, HER-2 positive (3+), ER/PR+ 95%, Ki67 75%   02/25/2020 Cancer Staging   Staging form: Breast, AJCC 8th Edition - Clinical stage from 02/25/2020: Stage IB (cT2, cN1, cM0, G3, ER+, PR+, HER2+) - Signed by GNicholas Lose MD on 02/25/2020    05/29/2020 - 05/29/2020 Chemotherapy   The patient had palonosetron (ALOXI) injection 0.25 mg, 0.25 mg, Intravenous,  Once, 1 of 3 cycles Administration: 0.25 mg (05/29/2020) pegfilgrastim-jmdb (FULPHILA) injection 6 mg, 6 mg, Subcutaneous,  Once, 1 of 3 cycles CARBOplatin (PARAPLATIN) 580 mg in sodium chloride 0.9 % 250 mL chemo infusion, 580 mg (100 %  of original dose 580 mg), Intravenous,  Once, 1 of 3 cycles Dose modification:   (original dose 580 mg, Cycle 1) Administration: 580 mg (05/29/2020) DOCEtaxel (TAXOTERE) 120 mg in sodium chloride 0.9 % 250 mL chemo infusion, 60 mg/m2 = 120 mg (100 % of original dose 60 mg/m2), Intravenous,  Once, 1 of 3  cycles Dose modification: 60 mg/m2 (original dose 60 mg/m2, Cycle 1, Reason: Provider Judgment), 50 mg/m2 (original dose 60 mg/m2, Cycle 2, Reason: Dose not tolerated) Administration: 120 mg (05/29/2020) fosaprepitant (EMEND) 150 mg in sodium chloride 0.9 % 145 mL IVPB, 150 mg, Intravenous,  Once, 1 of 3 cycles Administration: 150 mg (05/29/2020) pertuzumab (PERJETA) 420 mg in sodium chloride 0.9 % 250 mL chemo infusion, 420 mg (50 % of original dose 840 mg), Intravenous, Once, 1 of 3 cycles Dose modification: 420 mg (original dose 840 mg, Cycle 1, Reason: Provider Judgment) Administration: 420 mg (05/29/2020) trastuzumab-dkst (OGIVRI) 525 mg in sodium chloride 0.9 % 250 mL chemo infusion, 6 mg/kg = 525 mg (75 % of original dose 8 mg/kg), Intravenous,  Once, 1 of 3 cycles Dose modification: 6 mg/kg (original dose 8 mg/kg, Cycle 1, Reason: Provider Judgment) Administration: 525 mg (05/29/2020)   for chemotherapy treatment.     06/22/2020 Genetic Testing   Positive genetic testing: Heterozygous pathogenic variant detected in BRIP1 gene at c.2010dup (p.Glu671*).  Variant of uncertain significance detected in POLD1 at c.532G>A (p.Gly178Arg).  No other pathogenic variants detected in [facility] Common Hereditary Cancers Panel.  The report date is June 22, 2020.   The Common Hereditary Cancers Panel offered by [facility] includes sequencing and/or deletion duplication testing of the following 48 genes: APC, ATM, AXIN2, BARD1, BMPR1A, BRCA1, BRCA2, BRIP1, CDH1, CDK4, CDKN2A (p14ARF), CDKN2A (p16INK4a), CHEK2, CTNNA1, DICER1, EPCAM (Deletion/duplication testing only), GREM1 (promoter region deletion/duplication testing only), KIT, MEN1, MLH1, MSH2, MSH3, MSH6, MUTYH, NBN, NF1, NHTL1, PALB2, PDGFRA, PMS2, POLD1, POLE, PTEN, RAD50, RAD51C, RAD51D, RNF43, SDHB, SDHC, SDHD, SMAD4, SMARCA4. STK11, TP53, TSC1, TSC2, and VHL.  The following genes were evaluated for sequence changes only: SDHA and HOXB13 c.251G>A variant  only.   07/27/2020 Surgery   Left mastectomy [name]): three foci of IDC, 3.7cm, 1.5cm, and 1.2cm, with intermediate to high grade DCIS, clear margins, with 1/2 left axillary lymph nodes positive for carcinoma.    08/18/2020 -  Chemotherapy    Patient is on Treatment Plan: BREAST ADO-TRASTUZUMAB EMTANSINE [facility]) Q21D       09/09/2020 - 10/23/2020 Radiation Therapy   Adjuvant radiation    Ms Nicollette Wilhelmi is a 46 year old parous woman with a history of vulvar cancer who was seen in consultation at the request of Dr Lindi Adie for evaluation of a deleterious mutation in [place].   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  At the time of seeking consultation for sentinel lymph node biopsy with Dr. Thurston Pounds at [facility], it was determined that she was not a good candidate for primary radical vulvectomy due to the close proximity of the lesion to her anal sphincter.  Therefore she was treated with definitive external beam radiation to the lower pelvis and vulvar tissues.  Radiation dosing was between 03/16/20 through 05/08/20. It consisted of IMRT to the pelvis (45 Gy) and IMRT boost to the vulva (16.2 Gy).   Simultaneous to her diagnosis of vulvar cancer was a diagnosis of ER/PR positive  breast cancer on the left.  This was stage II with positive lymph node.  She underwent treatment with Adriamycin and Taxotere followed by left mastectomy, trastuzumab infusion for HER-2 positive lesion, and was planned for chest wall radiation beginning in the new year of 2022.   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   Screening ultrasound of the pelvis on 11/06/2020 revealed a uterus measuring 9.1 x 3.3 x 4.3 cm with an endometrial thickness of 6 mm and bilateral ovaries  which were normal in size and appearance.   On 05/18/21 she underwent robotic assisted total hysterectomy, BSO and vulvar biopsy. Intraoperative findings were significant for complete clinical response at vulva, normal appearing tubes and ovaries, normal appearing fibroid uterus. Surgery was uncomplicated.  Final pathology revealed benign tubes , ovaries, uterus. VIN 1 at vulva.   Since surgery she has done well with no major complaints.   Interval History: The patient saw Dr. Sondra Come on 10/27, was NED at that time.   Since her last visit, was diagnosed with hyperthyroidism and is on propanolol and methimazole.  Her symptoms were thought initially to be related to her breast cancer treatment and this was stopped.  She has finally started gaining some weight back and notes increased appetite.  She has continued intermittent and mild vaginal discharge.  Denies any vaginal bleeding.  Denies any vulvar pruritus or pain.  She endorses intermittent left-sided pelvic pain.  Had a CT scan at the end of December to rule out cancer recurrence.  This was negative for pelvic pathology.  She describes this pain is happening almost daily and sometimes multiple times a day.  It is sharp pain that occurs in her left deep pelvis and radiates up the side of her abdomen to the level of her breast.  If she is walking, she will have to stop, bend over, and wait for the pain to pass.  This often happens in seconds.  Denies any urinary symptoms, notes increased frequency of bowel movement since starting medication for her hyperthyroidism.  She has a gastroenterologist although has not had follow-up recently.  Past Medical/Surgical History: Past Medical History:  Diagnosis Date   Arthritis    Bipolar 1 disorder ([place])    Cancer ([place])    Complication of anesthesia    wakes up during procedures   Depression    GAD (generalized anxiety disorder)    Genital HSV    currently per pt  no break out 03-22-2016    GERD  (gastroesophageal reflux disease)    Hiatal hernia    History of cervical dysplasia    2012 laser ablation   History of esophageal dilatation    for dysphasia -- x2 dilated   History of gastric ulcer    History of Helicobacter pylori infection    remote hx   History of hidradenitis suppurativa    "gets all over body intermittantly"     History of hypertension    no issue since stopped drinking alcohol 2014   History of kidney stones    History of panic attacks    History of radiation therapy 03/16/2020-05/08/2020   vulva  Dr Gery Pray   History of radiation therapy 09/09/2020-10/23/2020   left chest wall/left SCV   Dr Gery Pray   Hypertension    Iron deficiency anemia    Left ureteral stone    OCD (  obsessive compulsive disorder)    PONV (postoperative nausea and vomiting)    Pre-diabetes    PTSD (post-traumatic stress disorder)    Recovering alcoholic in remission ([place])    since 2014   RLS (restless legs syndrome)    Smokers' cough ([place])    Urgency of urination    Yeast infection involving the vagina and surrounding area    secondary to taking antibiotic    Past Surgical History:  Procedure Laterality Date   CESAREAN SECTION  1995   w/  Bilateral Tubal Ligation   COLONOSCOPY  last one 08-09-2013   CYSTOSCOPY W/ URETERAL STENT PLACEMENT Left 03/29/2016   Procedure: CYSTOSCOPY WITH STENT REPLACEMENT;  Surgeon: Nickie Retort, MD;  Location: [facility];  Service: Urology;  Laterality: Left;   CYSTOSCOPY WITH RETROGRADE PYELOGRAM, URETEROSCOPY AND STENT PLACEMENT Left 03/08/2016   Procedure: CYSTOSCOPY WITH  LEFT RETROGRADE PYELOGRAM, AND STENT PLACEMENT;  Surgeon: Nickie Retort, MD;  Location: [facility];  Service: Urology;  Laterality: Left;   CYSTOSCOPY/RETROGRADE/URETEROSCOPY/STONE EXTRACTION WITH BASKET Left 03/29/2016   Procedure: CYSTOSCOPY/RETROGRADE/URETEROSCOPY/STONE EXTRACTION WITH BASKET;  Surgeon: Nickie Retort, MD;  Location: [facility];  Service: Urology;  Laterality: Left;   ENDOMETRIAL ABLATION W/ NOVASURE  04-01-2010   ESOPHAGOGASTRODUODENOSCOPY  last one 08-09-2013   KNEE ARTHROSCOPY Left as teen   LASER ABLATION OF THE CERVIX  2012 approx   MASTECTOMY WITH AXILLARY LYMPH NODE DISSECTION Left 07/27/2020   Procedure: LEFT MASTECTOMY WITH LEFT RADIOACTIVE SEED GUIDED TARGETED AXILLARY LYMPH NODE DISSECTION;  Surgeon: Alphonsa Overall, MD;  Location: [facility];  Service: General;  Laterality: Left;   PORT-A-CATH REMOVAL Right 05/18/2021   Procedure: REMOVAL PORT-A-CATH;  Surgeon: Donnie Mesa, MD;  Location: [facility];  Service: General;  Laterality: Right;   PORTACATH PLACEMENT Right 03/06/2020   Procedure: INSERTION PORT-A-CATH WITH ULTRASOUND GUIDANCE;  Surgeon: Alphonsa Overall, MD;  Location: [facility];  Service: General;  Laterality: Right;   ROBOTIC ASSISTED TOTAL HYSTERECTOMY WITH BILATERAL SALPINGO OOPHERECTOMY N/A 05/18/2021   Procedure: XI ROBOTIC ASSISTED TOTAL HYSTERECTOMY WITH BILATERAL SALPINGO OOPHORECTOMY;  Surgeon: Everitt Amber, MD;  Location: [facility];  Service: Gynecology;  Laterality: N/A;   TRANSTHORACIC ECHOCARDIOGRAM  05-19-2006   lvsf normal, ef 55-65%, there was mild flattening of the interventricular septum during diastoli/  RV size at upper limits normal   TUBAL LIGATION     VULVA /PERINEUM BIOPSY N/A 05/18/2021   Procedure: VULVAR BIOPSY;  Surgeon: Everitt Amber, MD;  Location: [facility];  Service: Gynecology;  Laterality: N/A;   WISDOM TOOTH EXTRACTION  age 65 's    Family History  Problem Relation Age of Onset   Heart disease Father    Lung cancer Father        d. 39   Alcohol abuse Father    Heart disease Mother    Depression Mother    Anxiety disorder Mother    Drug abuse Brother    Alcohol abuse Brother    Drug abuse Brother    ADD / ADHD Brother    Colon polyps Brother    Cancer Paternal Grandfather        "stomach"   Diabetes Maternal Grandfather    Diabetes Paternal  Grandmother    Kidney disease Maternal Uncle    Cirrhosis Cousin        alcoholic   Anxiety disorder Maternal Aunt    Depression Maternal Aunt    Cancer Cousin  maternal; ovarian cancer or other "female" cancer?   Lung cancer Paternal Uncle 109   Throat cancer Cousin        paternal; dx 24s   Lung cancer Cousin        paternal; dx 62s    Social History   Socioeconomic History   Marital status: Single    Spouse name: Not on file   Number of children: Not on file   Years of education: Not on file   Highest education level: Not on file  Occupational History   Not on file  Tobacco Use   Smoking status: Every Day    Packs/day: 0.50    Years: 22.00    Pack years: 11.00    Types: Cigarettes   Smokeless tobacco: Never   Tobacco comments:    Recently started a smoking cessation class.   Vaping Use   Vaping Use: Never used  Substance and Sexual Activity   Alcohol use: Not Currently    Alcohol/week: 0.0 standard drinks    Comment:  hx alcohollism  in remission since 2014   Drug use: Yes    Types: Marijuana    Comment: occas.   Sexual activity: Yes    Partners: Male    Birth control/protection: Surgical  Other Topics Concern   Not on file  Social History Narrative   Former [facility] patient.      Was on disability at one point.   Return to the workforce.  40 hours a week at [employer], 10 hours a week on the weekends at [employer] [facility] [facility] in [place] at night 10 to 12 hours a week.      Has grown children, she lives alone with a pet, continues to smoke no alcohol or drug use at this time      History of EtOH abuse and THC use.         Social Determinants of Health   Financial Resource Strain: Low Risk    Difficulty of Paying Living Expenses: Not hard at all  Food Insecurity: No Food Insecurity   Worried About Charity fundraiser in the Last Year: Never true   [place] in the Last Year: Never true  Transportation Needs: No  Transportation Needs   Lack of Transportation (Medical): No   Lack of Transportation (Non-Medical): No  Physical Activity: Inactive   Days of Exercise per Week: 0 days   Minutes of Exercise per Session: 0 min  Stress: Stress Concern Present   Feeling of Stress : Rather much  Social Connections: Socially Isolated   Frequency of Communication with Friends and Family: More than three times a week   Frequency of Social Gatherings with Friends and Family: More than three times a week   Attends Religious Services: Never   Marine scientist or Organizations: No   Attends Archivist Meetings: Never   Marital Status: Never married    Current Medications:  Current Outpatient Medications:    acetaminophen (TYLENOL) 500 MG tablet, Take 2 tablets (1,000 mg total) by mouth every 6 (six) hours as needed for moderate pain or fever. Avoid tylenol until your liver function back to normal, Disp: 30 tablet, Rfl: 0   Cholecalciferol (VITAMIN D3) 125 MCG (5000 UT) CAPS, Take by mouth. (Patient not taking: Reported on 09/14/2021), Disp: , Rfl:    diclofenac Sodium (VOLTAREN) 1 % GEL, Apply 4 grams topically 4  times daily as needed., Disp: 500 g, Rfl: 6  diphenoxylate-atropine (LOMOTIL) 2.5-0.025 MG tablet, TAKE 1 TO 2 TABLETS BY MOUTH FOUR TIMES DAILY AS NEEDED FOR DIARRHEA (Patient not taking: Reported on 09/14/2021), Disp: 45 tablet, Rfl: 1   doxylamine, Sleep, (UNISOM) 25 MG tablet, Take 25 mg by mouth at bedtime as needed for sleep., Disp: , Rfl:    gabapentin (NEURONTIN) 300 MG capsule, Take 1 capsule (300 mg total) by mouth daily., Disp: 30 capsule, Rfl: 5   Glucosamine 750 MG TABS, Take 750 mg by mouth in the morning and at bedtime. (Patient not taking: Reported on 09/14/2021), Disp: , Rfl:    lisinopril (ZESTRIL) 20 MG tablet, TAKE 1/2 TABLET BY MOUTH ONCE A DAY, Disp: 90 tablet, Rfl: 3   loratadine (CLARITIN) 10 MG tablet, Take 10 mg by mouth daily., Disp: , Rfl:    LORazepam (ATIVAN) 1  MG tablet, Take 1 tablet by mouth 3 times daily as needed for anxiety., Disp: 90 tablet, Rfl: 2   magnesium oxide (MAG-OX) 400 (241.3 Mg) MG tablet, Take 1 tablet (400 mg total) by mouth 2 (two) times daily. (Patient not taking: Reported on 09/14/2021), Disp: 60 tablet, Rfl: 1   methimazole (TAPAZOLE) 10 MG tablet, Take 1 tablet (10 mg total) by mouth 2 (two) times daily., Disp: 60 tablet, Rfl: 1   Multiple Vitamin (MULTIVITAMIN WITH MINERALS) TABS tablet, Take 1 tablet by mouth daily., Disp: , Rfl:    nicotine (NICODERM CQ) 21 mg/24hr patch, Place 1 patch (21 mg total) onto the skin daily. (Patient not taking: Reported on 09/14/2021), Disp: 28 patch, Rfl: 0   OLANZapine (ZYPREXA) 7.5 MG tablet, Take 1 tablet (7.5 mg total) by mouth at bedtime., Disp: 30 tablet, Rfl: 2   omeprazole (PRILOSEC) 40 MG capsule, TAKE 1 CAPSULE BY MOUTH EVERY MORNING AND EVERY NIGHT AT BEDTIME, Disp: 60 capsule, Rfl: 3   promethazine (PHENERGAN) 25 MG tablet, TAKE 1 TABLET BY MOUTH EVERY 6 HOURS AS NEEDED FOR NAUSEA, Disp: 30 tablet, Rfl: 6   propranolol (INDERAL) 10 MG tablet, Take 1 tablet (10 mg total) by mouth 2 (two) times daily., Disp: 60 tablet, Rfl: 1   rivaroxaban (XARELTO) 20 MG TABS tablet, TAKE 1 TABLET BY MOUTH DAILY WITH SUPPER., Disp: 30 tablet, Rfl: 6   rosuvastatin (CRESTOR) 10 MG tablet, TAKE 1 TABLET BY MOUTH ONCE A DAY, Disp: 90 tablet, Rfl: 3   senna-docusate (SENOKOT-S) 8.6-50 MG tablet, Take 2 tablets by mouth at bedtime. For AFTER surgery only, do not take if having diarrhea (Patient not taking: Reported on 08/06/2021), Disp: 30 tablet, Rfl: 0   Turmeric 450 MG CAPS, Take 450 mg by mouth in the morning and at bedtime. (Patient not taking: Reported on 09/14/2021), Disp: , Rfl:    venlafaxine XR (EFFEXOR-XR) 37.5 MG 24 hr capsule, TAKE 3 CAPSULES BY MOUTH ONCE A DAY, Disp: 90 capsule, Rfl: 2 No current facility-administered medications for this visit.  Facility-Administered Medications Ordered in Other  Visits:    LORazepam (ATIVAN) injection 0.5 mg, 0.5 mg, Intravenous, Once, Tanner, Lyndon Code., PA-C  Review of Systems: Pertinent positives include fatigue, incontinence, hot flashes, pelvic pain. Denies appetite changes, fevers, chills, unexplained weight changes. Denies hearing loss, neck lumps or masses, mouth sores, ringing in ears or voice changes. Denies cough or wheezing.  Denies shortness of breath. Denies chest pain or palpitations. Denies leg swelling. Denies abdominal distention, pain, blood in stools, constipation, diarrhea, nausea, vomiting, or early satiety. Denies pain with intercourse, dysuria, frequency, hematuria. Denies vaginal bleeding.   Denies joint  pain, back pain or muscle pain/cramps. Denies itching, rash, or wounds. Denies dizziness, headaches, numbness or seizures. Denies swollen lymph nodes or glands, denies easy bruising or bleeding. Denies anxiety, depression, confusion, or decreased concentration.  Physical Exam: BP 139/81 (BP Location: Right Arm, Patient Position: Sitting)    Pulse 98    Temp 98.5 F (36.9 C)    Resp 18    Wt 184 lb 4 oz (83.6 kg)    SpO2 100%    BMI 30.19 kg/m  General: Alert, oriented, no acute distress. HEENT: Normocephalic, atraumatic, sclera anicteric. Chest: Clear to auscultation bilaterally.  No wheezes or rhonchi. Cardiovascular: Regular rate and rhythm, no murmurs. Abdomen: soft, nontender.  Normoactive bowel sounds.  No masses or hepatosplenomegaly appreciated.   Extremities: Grossly normal range of motion.  Warm, well perfused.  No edema bilaterally. Lymphatics: No cervical, supraclavicular, or inguinal adenopathy. GU: External female genitalia with changes consistent with history of radiation. No visible lesions, hyperkeratosis, or ulcerations.  Mild hypopigmentation along the perineal body.  On vaginal exam, well rugated vaginal mucosa, minimal physiologic discharge, no bleeding.  No masses or lesions.  Cuff intact.  On bimanual  exam, no masses or nodularity.  Laboratory & Radiologic Studies: CT A/P on 08/06/21: IMPRESSION: Post hysterectomy without adnexal mass. No gross lesion or mass about the vulva. Perineum is incompletely evaluated.   No evidence of metastatic disease in the abdomen or pelvis.   Hepatic steatosis.   Small fat containing umbilical hernia.   Aortic atherosclerosis.   Aortic Atherosclerosis (ICD10-I70.0).  Assessment & Plan: Sandra Miller is a 46 y.o. woman with a history of stage IB vulvar cancer s/p definitive radiation completed in 05/2020, s/p diagnosis of HR positive left breast cancer, and deleterious mutation in BRIP1 who underwent risk reducing hysterectomy and BSO in 05/2021.  Presents today for surveillance visit.  Patient is overall doing well and is NED on vulvar exam today.  We discussed continued surveillance visits every 3 months until she is 2 years out from completion of treatment which will be in 05/2022.   In terms of pelvic pain, discussed starting a bowel regimen, increasing water and fiber intake.  Also encouraged her to try simethicone.  She is going to call me back in 2 to 3 weeks if she does not have improvement of her symptoms.  If this is the case, we will plan to refer her to pelvic floor physical therapy.  The other evaluation that we discussed if none of these interventions are helpful would be referral back to see her gastroenterologist.  In terms of her deleterious mutation in BRIP 1 conferring increased hereditary risk of ovarian cancer, she is s/p robotic assisted total hysterectomy with BSO for risk reduction in September, 2022.   32 minutes of total time was spent for this patient encounter, including preparation, face-to-face counseling with the patient and coordination of care, and documentation of the encounter.  Jeral Pinch, MD  Division of Gynecologic Oncology  Department of Obstetrics and Gynecology  [facility]

## 2021-09-30 NOTE — Patient Instructions (Addendum)
It was good to see you today.  I do not see any evidence of cancer recurrence on your exam.  I will see you for follow-up at the end of June.  If you develop any new symptoms before your neck scheduled visit, please call to see me sooner.  In terms of your pelvic pain, please try adding something for your bowels (in addition to increasing water and fiber intake as we discussed) and try over-the-counter gas medication like simethicone or Gas-X.  If you do not have relief of your pain in the next couple of weeks, please call me and I will place an order for you to be evaluated by one of our pelvic floor physical therapist.

## 2021-10-01 ENCOUNTER — Encounter: Payer: Self-pay | Admitting: Hematology and Oncology

## 2021-10-01 ENCOUNTER — Telehealth: Payer: PRIVATE HEALTH INSURANCE | Admitting: Physician Assistant

## 2021-10-01 ENCOUNTER — Ambulatory Visit: Payer: Self-pay | Admitting: *Deleted

## 2021-10-01 ENCOUNTER — Other Ambulatory Visit (HOSPITAL_COMMUNITY): Payer: Self-pay

## 2021-10-01 DIAGNOSIS — U071 COVID-19: Secondary | ICD-10-CM | POA: Diagnosis not present

## 2021-10-01 MED ORDER — MOLNUPIRAVIR EUA 200MG CAPSULE
4.0000 | ORAL_CAPSULE | Freq: Two times a day (BID) | ORAL | 0 refills | Status: AC
  Filled 2021-10-01: qty 40, 5d supply, fill #0

## 2021-10-01 MED ORDER — BENZONATATE 100 MG PO CAPS
100.0000 mg | ORAL_CAPSULE | Freq: Three times a day (TID) | ORAL | 0 refills | Status: DC | PRN
  Filled 2021-10-01: qty 30, 10d supply, fill #0

## 2021-10-01 MED ORDER — IPRATROPIUM BROMIDE 0.03 % NA SOLN
2.0000 | Freq: Two times a day (BID) | NASAL | 0 refills | Status: DC
  Filled 2021-10-01: qty 30, 75d supply, fill #0

## 2021-10-01 NOTE — Progress Notes (Signed)
Virtual Visit Consent   [place], you are scheduled for a virtual visit with a [facility] provider today.     Just as with appointments in the office, your consent must be obtained to participate.  Your consent will be active for this visit and any virtual visit you may have with one of our providers in the next 365 days.     If you have a MyChart account, a copy of this consent can be sent to you electronically.  All virtual visits are billed to your insurance company just like a traditional visit in the office.    As this is a virtual visit, video technology does not allow for your provider to perform a traditional examination.  This may limit your provider's ability to fully assess your condition.  If your provider identifies any concerns that need to be evaluated in person or the need to arrange testing (such as labs, EKG, etc.), we will make arrangements to do so.     Although advances in technology are sophisticated, we cannot ensure that it will always work on either your end or our end.  If the connection with a video visit is poor, the visit may have to be switched to a telephone visit.  With either a video or telephone visit, we are not always able to ensure that we have a secure connection.     I need to obtain your verbal consent now.   Are you willing to proceed with your visit today?    Sandra Miller has provided verbal consent on 10/01/2021 for a virtual visit (video or telephone).   Mar Daring, PA-C   Date: 10/01/2021 1:36 PM   Virtual Visit via Video Note   I, Mar Daring, connected with  Sandra Miller  ([id number], 1976-03-01) on 10/01/21 at  1:15 PM EST by a video-enabled telemedicine application and verified that I am speaking with the correct person using two identifiers.  Location: Patient: Virtual Visit Location Patient: Home Provider: Virtual Visit Location Provider: Home Office   I discussed the limitations of evaluation and management  by telemedicine and the availability of in person appointments. The patient expressed understanding and agreed to proceed.    History of Present Illness: Sandra Miller is a 46 y.o. who identifies as a female who was assigned female at birth, and is being seen today for Covid 56.  HPI: URI  This is a new problem. The current episode started yesterday (Symptoms started yesterday, and tested positive for Covid 19 on at home test yesterday). The problem has been unchanged. Maximum temperature: 99.4. Associated symptoms include congestion, coughing, diarrhea, headaches, nausea and a sore throat. Pertinent negatives include no ear pain, plugged ear sensation, rhinorrhea, sinus pain or vomiting. Associated symptoms comments: Chills, sweats, fatigue, muscle aches. Treatments tried: tylenol, coricidin hbp. The treatment provided mild relief.     Problems:  Patient Active Problem List   Diagnosis Date Noted   Hyperthyroidism 08/13/2021   Bipolar 1 disorder ([place]) 05/07/2021   Class 2 obesity in adult 05/07/2021   Hyperlipidemia 08/12/2020   Pre-diabetes 08/12/2020   Hypomagnesemia 08/12/2020   Mutation in BRIP1 gene 06/24/2020   Hypokalemia 06/17/2020   Dehydration    DVT (deep venous thrombosis) ([place]) 06/16/2020   Colitis 06/16/2020   Acute embolism and thrombosis of right internal jugular vein ([place]) 06/12/2020   Port-A-Cath in place 03/11/2020   Malignant neoplasm of upper-inner quadrant of left breast in female, estrogen receptor  positive ([place]) 02/25/2020   Vulvar cancer ([place]) 01/29/2020   Vulvar ulceration 01/23/2020   Essential hypertension 10/31/2019   Nausea and vomiting 12/27/2018   Seasonal allergic rhinitis 12/27/2018   History of ELISA positive for HSV 10/01/2018   Restless leg syndrome 04/01/2016   Nephrolithiasis 03/08/2016   Obstructive pyelonephritis 03/08/2016   Normocytic anemia 03/08/2016   Hidradenitis suppurativa of left axilla 11/03/2015   Loss of weight 09/08/2015    Neck pain on left side 10/22/2014   Genital warts 05/20/2014   GERD (gastroesophageal reflux disease) 12/26/2012   Functional dyspepsia 12/26/2012   Tobacco abuse 08/22/2012   Herpes simplex virus (HSV) infection 02/03/2007    Allergies:  Allergies  Allergen Reactions   Dilaudid [Hydromorphone Hcl] Other (See Comments)    Pt became confused, pulled out iv's, does not remember anything   Depakote [Divalproex Sodium] Nausea And Vomiting   Minocycline Hives   Aspirin Hives    States able to tolerate Goody Powders without any problem except for GI upset   Medications:  Current Outpatient Medications:    benzonatate (TESSALON) 100 MG capsule, Take 1 capsule (100 mg total) by mouth 3 (three) times daily as needed., Disp: 30 capsule, Rfl: 0   ipratropium (ATROVENT) 0.03 % nasal spray, Place 2 sprays into both nostrils every 12 (twelve) hours., Disp: 30 mL, Rfl: 0   molnupiravir EUA (LAGEVRIO) 200 mg CAPS capsule, Take 4 capsules (800 mg total) by mouth 2 (two) times daily for 5 days., Disp: 40 capsule, Rfl: 0   acetaminophen (TYLENOL) 500 MG tablet, Take 2 tablets (1,000 mg total) by mouth every 6 (six) hours as needed for moderate pain or fever. Avoid tylenol until your liver function back to normal, Disp: 30 tablet, Rfl: 0   Cholecalciferol (VITAMIN D3) 125 MCG (5000 UT) CAPS, Take by mouth. (Patient not taking: Reported on 09/14/2021), Disp: , Rfl:    diclofenac Sodium (VOLTAREN) 1 % GEL, Apply 4 grams topically 4  times daily as needed., Disp: 500 g, Rfl: 6   diphenoxylate-atropine (LOMOTIL) 2.5-0.025 MG tablet, TAKE 1 TO 2 TABLETS BY MOUTH FOUR TIMES DAILY AS NEEDED FOR DIARRHEA (Patient not taking: Reported on 09/14/2021), Disp: 45 tablet, Rfl: 1   doxylamine, Sleep, (UNISOM) 25 MG tablet, Take 25 mg by mouth at bedtime as needed for sleep., Disp: , Rfl:    gabapentin (NEURONTIN) 300 MG capsule, Take 1 capsule (300 mg total) by mouth daily., Disp: 30 capsule, Rfl: 5   Glucosamine 750 MG  TABS, Take 750 mg by mouth in the morning and at bedtime. (Patient not taking: Reported on 09/14/2021), Disp: , Rfl:    lisinopril (ZESTRIL) 20 MG tablet, TAKE 1/2 TABLET BY MOUTH ONCE A DAY, Disp: 90 tablet, Rfl: 3   loratadine (CLARITIN) 10 MG tablet, Take 10 mg by mouth daily., Disp: , Rfl:    LORazepam (ATIVAN) 1 MG tablet, Take 1 tablet by mouth 3 times daily as needed for anxiety., Disp: 90 tablet, Rfl: 2   magnesium oxide (MAG-OX) 400 (241.3 Mg) MG tablet, Take 1 tablet (400 mg total) by mouth 2 (two) times daily. (Patient not taking: Reported on 09/14/2021), Disp: 60 tablet, Rfl: 1   methimazole (TAPAZOLE) 10 MG tablet, Take 1 tablet (10 mg total) by mouth 2 (two) times daily., Disp: 60 tablet, Rfl: 1   Multiple Vitamin (MULTIVITAMIN WITH MINERALS) TABS tablet, Take 1 tablet by mouth daily., Disp: , Rfl:    nicotine (NICODERM CQ) 21 mg/24hr patch, Place 1 patch (21  mg total) onto the skin daily. (Patient not taking: Reported on 09/14/2021), Disp: 28 patch, Rfl: 0   OLANZapine (ZYPREXA) 7.5 MG tablet, Take 1 tablet (7.5 mg total) by mouth at bedtime., Disp: 30 tablet, Rfl: 2   omeprazole (PRILOSEC) 40 MG capsule, TAKE 1 CAPSULE BY MOUTH EVERY MORNING AND EVERY NIGHT AT BEDTIME, Disp: 60 capsule, Rfl: 3   promethazine (PHENERGAN) 25 MG tablet, TAKE 1 TABLET BY MOUTH EVERY 6 HOURS AS NEEDED FOR NAUSEA, Disp: 30 tablet, Rfl: 6   propranolol (INDERAL) 10 MG tablet, Take 1 tablet (10 mg total) by mouth 2 (two) times daily., Disp: 60 tablet, Rfl: 1   rivaroxaban (XARELTO) 20 MG TABS tablet, TAKE 1 TABLET BY MOUTH DAILY WITH SUPPER., Disp: 30 tablet, Rfl: 6   rosuvastatin (CRESTOR) 10 MG tablet, TAKE 1 TABLET BY MOUTH ONCE A DAY, Disp: 90 tablet, Rfl: 3   senna-docusate (SENOKOT-S) 8.6-50 MG tablet, Take 2 tablets by mouth at bedtime. For AFTER surgery only, do not take if having diarrhea (Patient not taking: Reported on 08/06/2021), Disp: 30 tablet, Rfl: 0   Turmeric 450 MG CAPS, Take 450 mg by mouth in  the morning and at bedtime. (Patient not taking: Reported on 09/14/2021), Disp: , Rfl:    venlafaxine XR (EFFEXOR-XR) 37.5 MG 24 hr capsule, TAKE 3 CAPSULES BY MOUTH ONCE A DAY, Disp: 90 capsule, Rfl: 2 No current facility-administered medications for this visit.  Facility-Administered Medications Ordered in Other Visits:    LORazepam (ATIVAN) injection 0.5 mg, 0.5 mg, Intravenous, Once, Tanner, Lyndon Code., PA-C  Observations/Objective: Patient is well-developed, well-nourished in no acute distress.  Resting comfortably at home.  Head is normocephalic, atraumatic.  No labored breathing.  Speech is clear and coherent with logical content.  Patient is alert and oriented at baseline.    Assessment and Plan: 1. COVID-19 - MyChart COVID-19 home monitoring program; Future - molnupiravir EUA (LAGEVRIO) 200 mg CAPS capsule; Take 4 capsules (800 mg total) by mouth 2 (two) times daily for 5 days.  Dispense: 40 capsule; Refill: 0 - benzonatate (TESSALON) 100 MG capsule; Take 1 capsule (100 mg total) by mouth 3 (three) times daily as needed.  Dispense: 30 capsule; Refill: 0 - ipratropium (ATROVENT) 0.03 % nasal spray; Place 2 sprays into both nostrils every 12 (twelve) hours.  Dispense: 30 mL; Refill: 0  - Continue OTC symptomatic management of choice - Will send OTC vitamins and supplement information through [facility] - Molnupiravir, tessalon perles and Ipratropium bromide nasal spray prescribed - Patient enrolled in MyChart symptom monitoring - Push fluids - Rest as needed - Discussed return precautions and when to seek in-person evaluation, sent via [facility] as well  Follow Up Instructions: I discussed the assessment and treatment plan with the patient. The patient was provided an opportunity to ask questions and all were answered. The patient agreed with the plan and demonstrated an understanding of the instructions.  A copy of instructions were sent to the patient via MyChart unless otherwise noted below.    The patient was advised to call back or seek an in-person evaluation if the symptoms worsen or if the condition fails to improve as anticipated.  Time:  I spent 15 minutes with the patient via telehealth technology discussing the above problems/concerns.    Mar Daring, PA-C

## 2021-10-01 NOTE — Patient Instructions (Signed)
Sandra Miller, thank you for joining Mar Daring, PA-C for today's virtual visit.  While this provider is not your primary care provider (PCP), if your PCP is located in our provider database this encounter information will be shared with them immediately following your visit.  Consent: (Patient) Sandra Miller provided verbal consent for this virtual visit at the beginning of the encounter.  Current Medications:  Current Outpatient Medications:    benzonatate (TESSALON) 100 MG capsule, Take 1 capsule (100 mg total) by mouth 3 (three) times daily as needed., Disp: 30 capsule, Rfl: 0   ipratropium (ATROVENT) 0.03 % nasal spray, Place 2 sprays into both nostrils every 12 (twelve) hours., Disp: 30 mL, Rfl: 0   molnupiravir EUA (LAGEVRIO) 200 mg CAPS capsule, Take 4 capsules (800 mg total) by mouth 2 (two) times daily for 5 days., Disp: 40 capsule, Rfl: 0   acetaminophen (TYLENOL) 500 MG tablet, Take 2 tablets (1,000 mg total) by mouth every 6 (six) hours as needed for moderate pain or fever. Avoid tylenol until your liver function back to normal, Disp: 30 tablet, Rfl: 0   Cholecalciferol (VITAMIN D3) 125 MCG (5000 UT) CAPS, Take by mouth. (Patient not taking: Reported on 09/14/2021), Disp: , Rfl:    diclofenac Sodium (VOLTAREN) 1 % GEL, Apply 4 grams topically 4  times daily as needed., Disp: 500 g, Rfl: 6   diphenoxylate-atropine (LOMOTIL) 2.5-0.025 MG tablet, TAKE 1 TO 2 TABLETS BY MOUTH FOUR TIMES DAILY AS NEEDED FOR DIARRHEA (Patient not taking: Reported on 09/14/2021), Disp: 45 tablet, Rfl: 1   doxylamine, Sleep, (UNISOM) 25 MG tablet, Take 25 mg by mouth at bedtime as needed for sleep., Disp: , Rfl:    gabapentin (NEURONTIN) 300 MG capsule, Take 1 capsule (300 mg total) by mouth daily., Disp: 30 capsule, Rfl: 5   Glucosamine 750 MG TABS, Take 750 mg by mouth in the morning and at bedtime. (Patient not taking: Reported on 09/14/2021), Disp: , Rfl:    lisinopril (ZESTRIL) 20 MG tablet,  TAKE 1/2 TABLET BY MOUTH ONCE A DAY, Disp: 90 tablet, Rfl: 3   loratadine (CLARITIN) 10 MG tablet, Take 10 mg by mouth daily., Disp: , Rfl:    LORazepam (ATIVAN) 1 MG tablet, Take 1 tablet by mouth 3 times daily as needed for anxiety., Disp: 90 tablet, Rfl: 2   magnesium oxide (MAG-OX) 400 (241.3 Mg) MG tablet, Take 1 tablet (400 mg total) by mouth 2 (two) times daily. (Patient not taking: Reported on 09/14/2021), Disp: 60 tablet, Rfl: 1   methimazole (TAPAZOLE) 10 MG tablet, Take 1 tablet (10 mg total) by mouth 2 (two) times daily., Disp: 60 tablet, Rfl: 1   Multiple Vitamin (MULTIVITAMIN WITH MINERALS) TABS tablet, Take 1 tablet by mouth daily., Disp: , Rfl:    nicotine (NICODERM CQ) 21 mg/24hr patch, Place 1 patch (21 mg total) onto the skin daily. (Patient not taking: Reported on 09/14/2021), Disp: 28 patch, Rfl: 0   OLANZapine (ZYPREXA) 7.5 MG tablet, Take 1 tablet (7.5 mg total) by mouth at bedtime., Disp: 30 tablet, Rfl: 2   omeprazole (PRILOSEC) 40 MG capsule, TAKE 1 CAPSULE BY MOUTH EVERY MORNING AND EVERY NIGHT AT BEDTIME, Disp: 60 capsule, Rfl: 3   promethazine (PHENERGAN) 25 MG tablet, TAKE 1 TABLET BY MOUTH EVERY 6 HOURS AS NEEDED FOR NAUSEA, Disp: 30 tablet, Rfl: 6   propranolol (INDERAL) 10 MG tablet, Take 1 tablet (10 mg total) by mouth 2 (two) times daily., Disp: 60 tablet,  Rfl: 1   rivaroxaban (XARELTO) 20 MG TABS tablet, TAKE 1 TABLET BY MOUTH DAILY WITH SUPPER., Disp: 30 tablet, Rfl: 6   rosuvastatin (CRESTOR) 10 MG tablet, TAKE 1 TABLET BY MOUTH ONCE A DAY, Disp: 90 tablet, Rfl: 3   senna-docusate (SENOKOT-S) 8.6-50 MG tablet, Take 2 tablets by mouth at bedtime. For AFTER surgery only, do not take if having diarrhea (Patient not taking: Reported on 08/06/2021), Disp: 30 tablet, Rfl: 0   Turmeric 450 MG CAPS, Take 450 mg by mouth in the morning and at bedtime. (Patient not taking: Reported on 09/14/2021), Disp: , Rfl:    venlafaxine XR (EFFEXOR-XR) 37.5 MG 24 hr capsule, TAKE 3  CAPSULES BY MOUTH ONCE A DAY, Disp: 90 capsule, Rfl: 2 No current facility-administered medications for this visit.  Facility-Administered Medications Ordered in Other Visits:    LORazepam (ATIVAN) injection 0.5 mg, 0.5 mg, Intravenous, Once, Tanner, Lyndon Code., PA-C   Medications ordered in this encounter:  Meds ordered this encounter  Medications   molnupiravir EUA (LAGEVRIO) 200 mg CAPS capsule    Sig: Take 4 capsules (800 mg total) by mouth 2 (two) times daily for 5 days.    Dispense:  40 capsule    Refill:  0    Order Specific Question:   Supervising Provider    Answer:   MILLER, BRIAN [[id number]]   benzonatate (TESSALON) 100 MG capsule    Sig: Take 1 capsule (100 mg total) by mouth 3 (three) times daily as needed.    Dispense:  30 capsule    Refill:  0    Order Specific Question:   Supervising Provider    Answer:   MILLER, BRIAN [[id number]]   ipratropium (ATROVENT) 0.03 % nasal spray    Sig: Place 2 sprays into both nostrils every 12 (twelve) hours.    Dispense:  30 mL    Refill:  0    Order Specific Question:   Supervising Provider    Answer:   Sabra Heck, BRIAN [[id number]]     *If you need refills on other medications prior to your next appointment, please contact your pharmacy*  Follow-Up: Call back or seek an in-person evaluation if the symptoms worsen or if the condition fails to improve as anticipated.  Other Instructions Molnupiravir Oral Capsules What is this medication? MOLNUPIRAVIR (mol nue pir a vir) treats COVID-19. It is an antiviral medication. It may decrease the risk of developing severe symptoms of COVID-19. It may also decrease the chance of going to the hospital. This medication is not approved by the FDA. The FDA has authorized emergency use of this medication during the COVID-19 pandemic. This medicine may be used for other purposes; ask your health care provider or pharmacist if you have questions. COMMON BRAND NAME(S): LAGEVRIO What should I tell my care team before I  take this medication? They need to know if you have any of these conditions: Any allergies Any serious illness An unusual or allergic reaction to molnupiravir, other medications, foods, dyes, or preservatives Pregnant or trying to get pregnant Breast-feeding How should I use this medication? Take this medication by mouth with water. Take it as directed on the prescription label at the same time every day. Do not cut, crush or chew this medication. Swallow the capsules whole. You can take it with or without food. If it upsets your stomach, take it with food. Take all of this medication unless your care team tells you to stop it early. Keep taking it even if  you think you are better. Talk to your care team about the use of this medication in children. Special care may be needed. Overdosage: If you think you have taken too much of this medicine contact a poison control center or emergency room at once. NOTE: This medicine is only for you. Do not share this medicine with others. What if I miss a dose? If you miss a dose, take it as soon as you can unless it is more than 10 hours late. If it is more than 10 hours late, skip the missed dose. Take the next dose at the normal time. Do not take extra or 2 doses at the same time to make up for the missed dose. What may interact with this medication? Interactions have not been studied. This list may not describe all possible interactions. Give your health care provider a list of all the medicines, herbs, non-prescription drugs, or dietary supplements you use. Also tell them if you smoke, drink alcohol, or use illegal drugs. Some items may interact with your medicine. What should I watch for while using this medication? Your condition will be monitored carefully while you are receiving this medication. Visit your care team for regular checkups. Tell your care team if your symptoms do not start to get better or if they get worse. Do not become pregnant while  taking this medication. You may need a pregnancy test before starting this medication. Women must use a reliable form of birth control while taking this medication and for 4 days after stopping the medication. Women should inform their care team if they wish to become pregnant or think they might be pregnant. Men should not father a child while taking this medication and for 3 months after stopping it. There is potential for serious harm to an unborn child. Talk to your care team for more information. Do not breast-feed an infant while taking this medication and for 4 days after stopping the medication. What side effects may I notice from receiving this medication? Side effects that you should report to your care team as soon as possible: Allergic reactions--skin rash, itching, hives, swelling of the face, lips, tongue, or throat Side effects that usually do not require medical attention (report these to your care team if they continue or are bothersome): Diarrhea Dizziness Nausea This list may not describe all possible side effects. Call your doctor for medical advice about side effects. You may report side effects to FDA at 1-800-FDA-1088. Where should I keep my medication? Keep out of the reach of children and pets. Store at room temperature between 20 and 25 degrees C (68 and 77 degrees F). Get rid of any unused medication after the expiration date. To get rid of medications that are no longer needed or have expired: Take the medication to a medication take-back program. Check with your pharmacy or law enforcement to find a location. If you cannot return the medication, check the label or package insert to see if the medication should be thrown out in the garbage or flushed down the toilet. If you are not sure, ask your care team. If it is safe to put it in the trash, take the medication out of the container. Mix the medication with cat litter, dirt, coffee grounds, or other unwanted substance.  Seal the mixture in a bag or container. Put it in the trash. NOTE: This sheet is a summary. It may not cover all possible information. If you have questions about this medicine, talk to  your doctor, pharmacist, or health care provider.  2022 Elsevier/Gold Standard (2020-08-31 00:00:00)   COVID-19: Quarantine and Isolation Quarantine If you were exposed Quarantine and stay away from others when you have been in close contact with someone who has COVID-19. Isolate If you are sick or test positive Isolate when you are sick or when you have COVID-19, even if you don't have symptoms. When to stay home Calculating quarantine The date of your exposure is considered day 0. Day 1 is the first full day after your last contact with a person who has had COVID-19. Stay home and away from other people for at least 5 days. Learn why CDC updated guidance for the general public. IF YOU were exposed to COVID-19 and are NOT  up to dateIF YOU were exposed to COVID-19 and are NOT on COVID-19 vaccinations Quarantine for at least 5 days Stay home Stay home and quarantine for at least 5 full days. Wear a well-fitting mask if you must be around others in your home. Do not travel. Get tested Even if you don't develop symptoms, get tested at least 5 days after you last had close contact with someone with COVID-19. After quarantine Watch for symptoms Watch for symptoms until 10 days after you last had close contact with someone with COVID-19. Avoid travel It is best to avoid travel until a full 10 days after you last had close contact with someone with COVID-19. If you develop symptoms Isolate immediately and get tested. Continue to stay home until you know the results. Wear a well-fitting mask around others. Take precautions until day 10 Wear a well-fitting mask Wear a well-fitting mask for 10 full days any time you are around others inside your home or in public. Do not go to places where you are unable to  wear a well-fitting mask. If you must travel during days 6-10, take precautions. Avoid being around people who are more likely to get very sick from COVID-19. IF YOU were exposed to COVID-19 and are  up to dateIF YOU were exposed to COVID-19 and are on COVID-19 vaccinations No quarantine You do not need to stay home unless you develop symptoms. Get tested Even if you don't develop symptoms, get tested at least 5 days after you last had close contact with someone with COVID-19. Watch for symptoms Watch for symptoms until 10 days after you last had close contact with someone with COVID-19. If you develop symptoms Isolate immediately and get tested. Continue to stay home until you know the results. Wear a well-fitting mask around others. Take precautions until day 10 Wear a well-fitting mask Wear a well-fitting mask for 10 full days any time you are around others inside your home or in public. Do not go to places where you are unable to wear a well-fitting mask. Take precautions if traveling Avoid being around people who are more likely to get very sick from COVID-19. IF YOU were exposed to COVID-19 and had confirmed COVID-19 within the past 90 days (you tested positive using a viral test) No quarantine You do not need to stay home unless you develop symptoms. Watch for symptoms Watch for symptoms until 10 days after you last had close contact with someone with COVID-19. If you develop symptoms Isolate immediately and get tested. Continue to stay home until you know the results. Wear a well-fitting mask around others. Take precautions until day 10 Wear a well-fitting mask Wear a well-fitting mask for 10 full days any time you are around  others inside your home or in public. Do not go to places where you are unable to wear a well-fitting mask. Take precautions if traveling Avoid being around people who are more likely to get very sick from COVID-19. Calculating isolation Day 0 is your  first day of symptoms or a positive viral test. Day 1 is the first full day after your symptoms developed or your test specimen was collected. If you have COVID-19 or have symptoms, isolate for at least 5 days. IF YOU tested positive for COVID-19 or have symptoms, regardless of vaccination status Stay home for at least 5 days Stay home for 5 days and isolate from others in your home. Wear a well-fitting mask if you must be around others in your home. Do not travel. Ending isolation if you had symptoms End isolation after 5 full days if you are fever-free for 24 hours (without the use of fever-reducing medication) and your symptoms are improving. Ending isolation if you did NOT have symptoms End isolation after at least 5 full days after your positive test. If you got very sick from COVID-19 or have a weakened immune system You should isolate for at least 10 days. Consult your doctor before ending isolation. Take precautions until day 10 Wear a well-fitting mask Wear a well-fitting mask for 10 full days any time you are around others inside your home or in public. Do not go to places where you are unable to wear a well-fitting mask. Do not travel Do not travel until a full 10 days after your symptoms started or the date your positive test was taken if you had no symptoms. Avoid being around people who are more likely to get very sick from COVID-19. Definitions Exposure Contact with someone infected with SARS-CoV-2, the virus that causes COVID-19, in a way that increases the likelihood of getting infected with the virus. Close contact A close contact is someone who was less than 6 feet away from an infected person (laboratory-confirmed or a clinical diagnosis) for a cumulative total of 15 minutes or more over a 24-hour period. For example, three individual 5-minute exposures for a total of 15 minutes. People who are exposed to someone with COVID-19 after they completed at least 5 days of  isolation are not considered close contacts. Julio Sicks is a strategy used to prevent transmission of COVID-19 by keeping people who have been in close contact with someone with COVID-19 apart from others. Who does not need to quarantine? If you had close contact with someone with COVID-19 and you are in one of the following groups, you do not need to quarantine. You are up to date with your COVID-19 vaccines. You had confirmed COVID-19 within the last 90 days (meaning you tested positive using a viral test). If you are up to date with COVID-19 vaccines, you should wear a well-fitting mask around others for 10 days from the date of your last close contact with someone with COVID-19 (the date of last close contact is considered day 0). Get tested at least 5 days after you last had close contact with someone with COVID-19. If you test positive or develop COVID-19 symptoms, isolate from other people and follow recommendations in the Isolation section below. If you tested positive for COVID-19 with a viral test within the previous 90 days and subsequently recovered and remain without COVID-19 symptoms, you do not need to quarantine or get tested after close contact. You should wear a well-fitting mask around others for 10 days from  the date of your last close contact with someone with COVID-19 (the date of last close contact is considered day 0). If you have COVID-19 symptoms, get tested and isolate from other people and follow recommendations in the Isolation section below. Who should quarantine? If you come into close contact with someone with COVID-19, you should quarantine if you are not up to date on COVID-19 vaccines. This includes people who are not vaccinated. What to do for quarantine Stay home and away from other people for at least 5 days (day 0 through day 5) after your last contact with a person who has COVID-19. The date of your exposure is considered day 0. Wear a well-fitting mask  when around others at home, if possible. For 10 days after your last close contact with someone with COVID-19, watch for fever (100.68F or greater), cough, shortness of breath, or other COVID-19 symptoms. If you develop symptoms, get tested immediately and isolate until you receive your test results. If you test positive, follow isolation recommendations. If you do not develop symptoms, get tested at least 5 days after you last had close contact with someone with COVID-19. If you test negative, you can leave your home, but continue to wear a well-fitting mask when around others at home and in public until 10 days after your last close contact with someone with COVID-19. If you test positive, you should isolate for at least 5 days from the date of your positive test (if you do not have symptoms). If you do develop COVID-19 symptoms, isolate for at least 5 days from the date your symptoms began (the date the symptoms started is day 0). Follow recommendations in the isolation section below. If you are unable to get a test 5 days after last close contact with someone with COVID-19, you can leave your home after day 5 if you have been without COVID-19 symptoms throughout the 5-day period. Wear a well-fitting mask for 10 days after your date of last close contact when around others at home and in public. Avoid people who are have weakened immune systems or are more likely to get very sick from COVID-19, and nursing homes and other high-risk settings, until after at least 10 days. If possible, stay away from people you live with, especially people who are at higher risk for getting very sick from COVID-19, as well as others outside your home throughout the full 10 days after your last close contact with someone with COVID-19. If you are unable to quarantine, you should wear a well-fitting mask for 10 days when around others at home and in public. If you are unable to wear a mask when around others, you should  continue to quarantine for 10 days. Avoid people who have weakened immune systems or are more likely to get very sick from COVID-19, and nursing homes and other high-risk settings, until after at least 10 days. See additional information about travel. Do not go to places where you are unable to wear a mask, such as restaurants and some gyms, and avoid eating around others at home and at work until after 10 days after your last close contact with someone with COVID-19. After quarantine Watch for symptoms until 10 days after your last close contact with someone with COVID-19. If you have symptoms, isolate immediately and get tested. Quarantine in high-risk congregate settings In certain congregate settings that have high risk of secondary transmission (such as Systems analyst and detention facilities, homeless shelters, or cruise ships), CDC recommends a  10-day quarantine for residents, regardless of vaccination and booster status. During periods of critical staffing shortages, facilities may consider shortening the quarantine period for staff to ensure continuity of operations. Decisions to shorten quarantine in these settings should be made in consultation with state, local, tribal, or territorial health departments and should take into consideration the context and characteristics of the facility. CDC's setting-specific guidance provides additional recommendations for these settings. Isolation Isolation is used to separate people with confirmed or suspected COVID-19 from those without COVID-19. People who are in isolation should stay home until it's safe for them to be around others. At home, anyone sick or infected should separate from others, or wear a well-fitting mask when they need to be around others. People in isolation should stay in a specific "sick room" or area and use a separate bathroom if available. Everyone who has presumed or confirmed COVID-19 should stay home and isolate from other people  for at least 5 full days (day 0 is the first day of symptoms or the date of the day of the positive viral test for asymptomatic persons). They should wear a mask when around others at home and in public for an additional 5 days. People who are confirmed to have COVID-19 or are showing symptoms of COVID-19 need to isolate regardless of their vaccination status. This includes: People who have a positive viral test for COVID-19, regardless of whether or not they have symptoms. People with symptoms of COVID-19, including people who are awaiting test results or have not been tested. People with symptoms should isolate even if they do not know if they have been in close contact with someone with COVID-19. What to do for isolation Monitor your symptoms. If you have an emergency warning sign (including trouble breathing), seek emergency medical care immediately. Stay in a separate room from other household members, if possible. Use a separate bathroom, if possible. Take steps to improve ventilation at home, if possible. Avoid contact with other members of the household and pets. Don't share personal household items, like cups, towels, and utensils. Wear a well-fitting mask when you need to be around other people. Learn more about what to do if you are sick and how to notify your contacts. Ending isolation for people who had COVID-19 and had symptoms If you had COVID-19 and had symptoms, isolate for at least 5 days. To calculate your 5-day isolation period, day 0 is your first day of symptoms. Day 1 is the first full day after your symptoms developed. You can leave isolation after 5 full days. You can end isolation after 5 full days if you are fever-free for 24 hours without the use of fever-reducing medication and your other symptoms have improved (Loss of taste and smell may persist for weeks or months after recovery and need not delay the end of isolation). You should continue to wear a well-fitting mask  around others at home and in public for 5 additional days (day 6 through day 10) after the end of your 5-day isolation period. If you are unable to wear a mask when around others, you should continue to isolate for a full 10 days. Avoid people who have weakened immune systems or are more likely to get very sick from COVID-19, and nursing homes and other high-risk settings, until after at least 10 days. If you continue to have fever or your other symptoms have not improved after 5 days of isolation, you should wait to end your isolation until you are fever-free  for 24 hours without the use of fever-reducing medication and your other symptoms have improved. Continue to wear a well-fitting mask through day 10. Contact your healthcare provider if you have questions. See additional information about travel. Do not go to places where you are unable to wear a mask, such as restaurants and some gyms, and avoid eating around others at home and at work until a full 10 days after your first day of symptoms. If an individual has access to a test and wants to test, the best approach is to use an antigen test1 towards the end of the 5-day isolation period. Collect the test sample only if you are fever-free for 24 hours without the use of fever-reducing medication and your other symptoms have improved (loss of taste and smell may persist for weeks or months after recovery and need not delay the end of isolation). If your test result is positive, you should continue to isolate until day 10. If your test result is negative, you can end isolation, but continue to wear a well-fitting mask around others at home and in public until day 10. Follow additional recommendations for masking and avoiding travel as described above. 1As noted in the labeling for authorized over-the counter antigen tests: Negative results should be treated as presumptive. Negative results do not rule out SARS-CoV-2 infection and should not be used as the  sole basis for treatment or patient management decisions, including infection control decisions. To improve results, antigen tests should be used twice over a three-day period with at least 24 hours and no more than 48 hours between tests. Note that these recommendations on ending isolation do not apply to people who are moderately ill or very sick from COVID-19 or have weakened immune systems. See section below for recommendations for when to end isolation for these groups. Ending isolation for people who tested positive for COVID-19 but had no symptoms If you test positive for COVID-19 and never develop symptoms, isolate for at least 5 days. Day 0 is the day of your positive viral test (based on the date you were tested) and day 1 is the first full day after the specimen was collected for your positive test. You can leave isolation after 5 full days. If you continue to have no symptoms, you can end isolation after at least 5 days. You should continue to wear a well-fitting mask around others at home and in public until day 10 (day 6 through day 10). If you are unable to wear a mask when around others, you should continue to isolate for 10 days. Avoid people who have weakened immune systems or are more likely to get very sick from COVID-19, and nursing homes and other high-risk settings, until after at least 10 days. If you develop symptoms after testing positive, your 5-day isolation period should start over. Day 0 is your first day of symptoms. Follow the recommendations above for ending isolation for people who had COVID-19 and had symptoms. See additional information about travel. Do not go to places where you are unable to wear a mask, such as restaurants and some gyms, and avoid eating around others at home and at work until 10 days after the day of your positive test. If an individual has access to a test and wants to test, the best approach is to use an antigen test1 towards the end of the 5-day  isolation period. If your test result is positive, you should continue to isolate until day 10. If your  test result is positive, you can also choose to test daily and if your test result is negative, you can end isolation, but continue to wear a well-fitting mask around others at home and in public until day 10. Follow additional recommendations for masking and avoiding travel as described above. 1As noted in the labeling for authorized over-the counter antigen tests: Negative results should be treated as presumptive. Negative results do not rule out SARS-CoV-2 infection and should not be used as the sole basis for treatment or patient management decisions, including infection control decisions. To improve results, antigen tests should be used twice over a three-day period with at least 24 hours and no more than 48 hours between tests. Ending isolation for people who were moderately or very sick from COVID-19 or have a weakened immune system People who are moderately ill from COVID-19 (experiencing symptoms that affect the lungs like shortness of breath or difficulty breathing) should isolate for 10 days and follow all other isolation precautions. To calculate your 10-day isolation period, day 0 is your first day of symptoms. Day 1 is the first full day after your symptoms developed. If you are unsure if your symptoms are moderate, talk to a healthcare provider for further guidance. People who are very sick from COVID-19 (this means people who were hospitalized or required intensive care or ventilation support) and people who have weakened immune systems might need to isolate at home longer. They may also require testing with a viral test to determine when they can be around others. CDC recommends an isolation period of at least 10 and up to 20 days for people who were very sick from COVID-19 and for people with weakened immune systems. Consult with your healthcare provider about when you can resume being around  other people. If you are unsure if your symptoms are severe or if you have a weakened immune system, talk to a healthcare provider for further guidance. People who have a weakened immune system should talk to their healthcare provider about the potential for reduced immune responses to COVID-19 vaccines and the need to continue to follow current prevention measures (including wearing a well-fitting mask and avoiding crowds and poorly ventilated indoor spaces) to protect themselves against COVID-19 until advised otherwise by their healthcare provider. Close contacts of immunocompromised people--including household members--should also be encouraged to receive all recommended COVID-19 vaccine doses to help protect these people. Isolation in high-risk congregate settings In certain high-risk congregate settings that have high risk of secondary transmission and where it is not feasible to cohort people (such as Systems analyst and detention facilities, homeless shelters, and cruise ships), CDC recommends a 10-day isolation period for residents. During periods of critical staffing shortages, facilities may consider shortening the isolation period for staff to ensure continuity of operations. Decisions to shorten isolation in these settings should be made in consultation with state, local, tribal, or territorial health departments and should take into consideration the context and characteristics of the facility. CDC's setting-specific guidance provides additional recommendations for these settings. This CDC guidance is meant to supplement--not replace--any federal, state, local, territorial, or tribal health and safety laws, rules, and regulations. Recommendations for specific settings These recommendations do not apply to healthcare professionals. For guidance specific to these settings, see Healthcare professionals: Interim Guidance for Optician, dispensing with SARS-CoV-2 Infection or Exposure to  SARS-CoV-2 Patients, residents, and visitors to healthcare settings: Interim Infection Prevention and Control Recommendations for Healthcare Personnel During the [place] 2019 (COVID-19) Pandemic Additional setting-specific guidance  and recommendations are available. These recommendations on quarantine and isolation do apply to [facility] settings. Additional guidance is available here: Overview of COVID-19 Quarantine for K-12 Schools Travelers: Travel information and recommendations Congregate facilities and other settings: [employer] for community, work, and school settings Ongoing COVID-19 exposure FAQs I live with someone with COVID-19, but I cannot be separated from them. How do we manage quarantine in this situation? It is very important for people with COVID-19 to remain apart from other people, if possible, even if they are living together. If separation of the person with COVID-19 from others that they live with is not possible, the other people that they live with will have ongoing exposure, meaning they will be repeatedly exposed until that person is no longer able to spread the virus to other people. In this situation, there are precautions you can take to limit the spread of COVID-19: The person with COVID-19 and everyone they live with should wear a well-fitting mask inside the home. If possible, one person should care for the person with COVID-19 to limit the number of people who are in close contact with the infected person. Take steps to protect yourself and others to reduce transmission in the home: Quarantine if you are not up to date with your COVID-19 vaccines. Isolate if you are sick or tested positive for COVID-19, even if you don't have symptoms. Learn more about the public health recommendations for testing, mask use and quarantine of close contacts, like yourself, who have ongoing exposure. These recommendations differ depending on your vaccination  status. What should I do if I have ongoing exposure to COVID-19 from someone I live with? Recommendations for this situation depend on your vaccination status: If you are not up to date on COVID-19 vaccines and have ongoing exposure to COVID-19, you should: Begin quarantine immediately and continue to quarantine throughout the isolation period of the person with COVID-19. Continue to quarantine for an additional 5 days starting the day after the end of isolation for the person with COVID-19. Get tested at least 5 days after the end of isolation of the infected person that lives with them. If you test negative, you can leave the home but should continue to wear a well-fitting mask when around others at home and in public until 10 days after the end of isolation for the person with COVID-19. Isolate immediately if you develop symptoms of COVID-19 or test positive. If you are up to date with COVID-19 vaccines and have ongoing exposure to COVID-19, you should: Get tested at least 5 days after your first exposure. A person with COVID-19 is considered infectious starting 2 days before they develop symptoms, or 2 days before the date of their positive test if they do not have symptoms. Get tested again at least 5 days after the end of isolation for the person with COVID-19. Wear a well-fitting mask when you are around the person with COVID-19, and do this throughout their isolation period. Wear a well-fitting mask around others for 10 days after the infected person's isolation period ends. Isolate immediately if you develop symptoms of COVID-19 or test positive. What should I do if multiple people I live with test positive for COVID-19 at different times? Recommendations for this situation depend on your vaccination status: If you are not up to date with your COVID-19 vaccines, you should: Quarantine throughout the isolation period of any infected person that you live with. Continue to quarantine until 5  days after the end  of isolation date for the most recently infected person that lives with you. For example, if the last day of isolation of the person most recently infected with COVID-19 was June 30, the new 5-day quarantine period starts on July 1. Get tested at least 5 days after the end of isolation for the most recently infected person that lives with you. Wear a well-fitting mask when you are around any person with COVID-19 while that person is in isolation. Wear a well-fitting mask when you are around other people until 10 days after your last close contact. Isolate immediately if you develop symptoms of COVID-19 or test positive. If you are up to date with your COVID-19 vaccines, you should: Get tested at least 5 days after your first exposure. A person with COVID-19 is considered infectious starting 2 days before they developed symptoms, or 2 days before the date of their positive test if they do not have symptoms. Get tested again at least 5 days after the end of isolation for the most recently infected person that lives with you. Wear a well-fitting mask when you are around any person with COVID-19 while that person is in isolation. Wear a well-fitting mask around others for 10 days after the end of isolation for the most recently infected person that lives with you. For example, if the last day of isolation for the person most recently infected with COVID-19 was June 30, the new 10-day period to wear a well-fitting mask indoors in public starts on July 1. Isolate immediately if you develop symptoms of COVID-19 or test positive. I had COVID-19 and completed isolation. Do I have to quarantine or get tested if someone I live with gets COVID-19 shortly after I completed isolation? No. If you recently completed isolation and someone that lives with you tests positive for the virus that causes COVID-19 shortly after the end of your isolation period, you do not have to quarantine or get tested as  long as you do not develop new symptoms. Once all of the people that live together have completed isolation or quarantine, refer to the guidance below for new exposures to COVID-19. If you had COVID-19 in the previous 90 days and then came into close contact with someone with COVID-19, you do not have to quarantine or get tested if you do not have symptoms. But you should: Wear a well-fitting mask indoors in public for 10 days after your last close contact. Monitor for COVID-19 symptoms for 10 days from the date of your last close contact. Isolate immediately and get tested if symptoms develop. If more than 90 days have passed since your recovery from infection, follow CDC's recommendations for close contacts. These recommendations will differ depending on your vaccination status. 12/02/2020 Content source: [facility] for Immunization and Respiratory Diseases ([facility]), Division of Viral Diseases This information is not intended to replace advice given to you by your health care provider. Make sure you discuss any questions you have with your health care provider. Document Revised: 04/07/2021 Document Reviewed: 04/07/2021 Elsevier Patient Education  2022 [employer].    If you have been instructed to have an in-person evaluation today at a local Urgent Care facility, please use the link below. It will take you to a list of all of our available [facility], including address, phone number and hours of operation. Please do not delay care.  [facility]  If you or a family member do not have a primary care provider, use  the link below to schedule a visit and establish care. When you choose a [facility] primary care physician or advanced practice provider, you gain a long-term partner in health. Find a Primary Care Provider  Learn more about [facility] in-office and virtual care options: [facility]

## 2021-10-01 NOTE — Telephone Encounter (Signed)
°  Chief Complaint: + COVID- concerned about health status Symptoms: cough, headache, body aches Frequency: symptoms started yesterday Pertinent Negatives: Patient denies SOB Disposition: [] ED /[] Urgent Care (no appt availability in office) / [] Appointment(In office/virtual)/ [x]  [facility]/ [] Home Care/ [] Refused Recommended Disposition /[] [facility]/ []  Follow-up with PCP Additional Notes:  Patient advised COVID protocol treatment/isolation - patient concerned about her health status-immune compromised- virtual visit scheduled

## 2021-10-01 NOTE — Telephone Encounter (Signed)
Reason for Disposition  [1] COVID-19 diagnosed by positive lab test (e.g., PCR, rapid self-test kit) AND [2] mild symptoms (e.g., cough, fever, others) AND [5] no complications or SOB  Answer Assessment - Initial Assessment Questions 1. COVID-19 DIAGNOSIS: "Who made your COVID-19 diagnosis?" "Was it confirmed by a positive lab test or self-test?" If not diagnosed by a doctor (or NP/PA), ask "Are there lots of cases (community spread) where you live?" Note: See public health department website, if unsure.     + home test- yesterday 2. COVID-19 EXPOSURE: "Was there any known exposure to COVID before the symptoms began?" CDC Definition of close contact: within 6 feet (2 meters) for a total of 15 minutes or more over a 24-hour period.      Possibly at work 3. ONSET: "When did the COVID-19 symptoms start?"      yesterday 4. WORST SYMPTOM: "What is your worst symptom?" (e.g., cough, fever, shortness of breath, muscle aches)     Achy, headache 5. COUGH: "Do you have a cough?" If Yes, ask: "How bad is the cough?"       Yes- dry 6. FEVER: "Do you have a fever?" If Yes, ask: "What is your temperature, how was it measured, and when did it start?"     No- 99.4 7. RESPIRATORY STATUS: "Describe your breathing?" (e.g., shortness of breath, wheezing, unable to speak)      normal 8. BETTER-SAME-WORSE: "Are you getting better, staying the same or getting worse compared to yesterday?"  If getting worse, ask, "In what way?"     same 9. HIGH RISK DISEASE: "Do you have any chronic medical problems?" (e.g., asthma, heart or lung disease, weak immune system, obesity, etc.)     Thyroid, cancer patient- recently finished treatment 10. VACCINE: "Have you had the COVID-19 vaccine?" If Yes, ask: "Which one, how many shots, when did you get it?"       yes 11. BOOSTER: "Have you received your COVID-19 booster?" If Yes, ask: "Which one and when did you get it?"       yes 12. PREGNANCY: "Is there any chance you are  pregnant?" "When was your last menstrual period?"       *No Answer* 13. OTHER SYMPTOMS: "Do you have any other symptoms?"  (e.g., chills, fatigue, headache, loss of smell or taste, muscle pain, sore throat)       Headache, chills, sore throat 14. O2 SATURATION MONITOR:  "Do you use an oxygen saturation monitor (pulse oximeter) at home?" If Yes, ask "What is your reading (oxygen level) today?" "What is your usual oxygen saturation reading?" (e.g., 95%)       *No Answer*  Protocols used: Coronavirus (COVID-19) Diagnosed or Suspected-A-AH

## 2021-10-04 ENCOUNTER — Telehealth: Payer: Self-pay | Admitting: Hematology and Oncology

## 2021-10-04 NOTE — Telephone Encounter (Signed)
R/s per pt req, called pt to r/s, unable to leave msg. Will mail calendar.

## 2021-10-06 ENCOUNTER — Telehealth: Payer: Self-pay

## 2021-10-06 NOTE — Telephone Encounter (Signed)
Contacted pt and lvm making her aware that letter is ready but if she would like she can pull letter from [place] and if she has any questions or concerns to give [place] a call

## 2021-10-06 NOTE — Telephone Encounter (Signed)
Copied from [facility] [id number]. Topic: General - Other >> Oct 06, 2021  8:41 AM Holley Dexter N wrote: Reason for CRM: Pt called in stating she tested positive for covid on 01/27 and her job told her she can be off for 5 days, but pt states she is still not feeling better and has immunocompromised issues, wanted to see about getting a doctors note for an extensions that can cover her until 02/06. Pt requested a call back, please advise. >> Oct 06, 2021  9:53 AM [place], Ander Purpura wrote: Please advise.

## 2021-10-06 NOTE — Telephone Encounter (Signed)
Will forward to provider  

## 2021-10-07 ENCOUNTER — Ambulatory Visit: Payer: PRIVATE HEALTH INSURANCE | Admitting: Hematology and Oncology

## 2021-10-08 ENCOUNTER — Ambulatory Visit: Payer: Self-pay

## 2021-10-08 NOTE — Telephone Encounter (Signed)
° °  Chief Complaint: Shortness of breath with exertion. Had COVID 19 last week.  Symptoms: Has dry cough Frequency: Started last week Pertinent Negatives: Patient denies fever Disposition: [] ED /[] Urgent Care (no appt availability in office) / [] Appointment(In office/virtual)/ []  [facility]/ [] Home Care/ [] Refused Recommended Disposition /[] [facility]/ [x]  Follow-up with PCP Additional Notes: Pt. Asking for a virtual visit today. Please advise pt.  Answer Assessment - Initial Assessment Questions 1. RESPIRATORY STATUS: "Describe your breathing?" (e.g., wheezing, shortness of breath, unable to speak, severe coughing)      SOB 2. ONSET: "When did this breathing problem begin?"      Last week 3. PATTERN "Does the difficult breathing come and go, or has it been constant since it started?"      Comes and goes 4. SEVERITY: "How bad is your breathing?" (e.g., mild, moderate, severe)    - MILD: No SOB at rest, mild SOB with walking, speaks normally in sentences, can lie down, no retractions, pulse < 100.    - MODERATE: SOB at rest, SOB with minimal exertion and prefers to sit, cannot lie down flat, speaks in phrases, mild retractions, audible wheezing, pulse 100-120.    - SEVERE: Very SOB at rest, speaks in single words, struggling to breathe, sitting hunched forward, retractions, pulse > 120      Mild 5. RECURRENT SYMPTOM: "Have you had difficulty breathing before?" If Yes, ask: "When was the last time?" and "What happened that time?"      No 6. CARDIAC HISTORY: "Do you have any history of heart disease?" (e.g., heart attack, angina, bypass surgery, angioplasty)      No 7. LUNG HISTORY: "Do you have any history of lung disease?"  (e.g., pulmonary embolus, asthma, emphysema)     Yes 8. CAUSE: "What do you think is causing the breathing problem?"      COVID 19 last week 9. OTHER SYMPTOMS: "Do you have any other symptoms? (e.g., dizziness, runny nose, cough, chest pain,  fever)     Cough 10. O2 SATURATION MONITOR:  "Do you use an oxygen saturation monitor (pulse oximeter) at home?" If Yes, "What is your reading (oxygen level) today?" "What is your usual oxygen saturation reading?" (e.g., 95%)       No 11. PREGNANCY: "Is there any chance you are pregnant?" "When was your last menstrual period?"       No 12. TRAVEL: "Have you traveled out of the country in the last month?" (e.g., travel history, exposures)       No  Protocols used: Breathing Difficulty-A-AH

## 2021-10-10 ENCOUNTER — Other Ambulatory Visit: Payer: Self-pay | Admitting: Nurse Practitioner

## 2021-10-11 ENCOUNTER — Other Ambulatory Visit (HOSPITAL_COMMUNITY): Payer: Self-pay

## 2021-10-11 MED ORDER — OMEPRAZOLE 40 MG PO CPDR
DELAYED_RELEASE_CAPSULE | ORAL | 3 refills | Status: DC
  Filled 2021-10-11: qty 60, 30d supply, fill #0
  Filled 2021-11-15: qty 60, 30d supply, fill #1
  Filled 2021-12-14: qty 60, 30d supply, fill #2
  Filled 2022-01-20: qty 60, 30d supply, fill #3

## 2021-10-11 NOTE — Telephone Encounter (Signed)
Will forward to provider  

## 2021-10-12 NOTE — Telephone Encounter (Signed)
Contacted pt to schedule an appt pt didn't answer lvm

## 2021-10-14 ENCOUNTER — Ambulatory Visit (INDEPENDENT_AMBULATORY_CARE_PROVIDER_SITE_OTHER): Payer: PRIVATE HEALTH INSURANCE | Admitting: Physician Assistant

## 2021-10-14 ENCOUNTER — Ambulatory Visit (INDEPENDENT_AMBULATORY_CARE_PROVIDER_SITE_OTHER): Payer: PRIVATE HEALTH INSURANCE

## 2021-10-14 ENCOUNTER — Encounter: Payer: Self-pay | Admitting: Physician Assistant

## 2021-10-14 ENCOUNTER — Other Ambulatory Visit: Payer: Self-pay

## 2021-10-14 ENCOUNTER — Encounter: Payer: Self-pay | Admitting: Hematology and Oncology

## 2021-10-14 DIAGNOSIS — M25562 Pain in left knee: Secondary | ICD-10-CM

## 2021-10-14 DIAGNOSIS — G8929 Other chronic pain: Secondary | ICD-10-CM

## 2021-10-14 MED ORDER — LIDOCAINE HCL 1 % IJ SOLN
5.0000 mL | INTRAMUSCULAR | Status: AC | PRN
  Administered 2021-10-14: 5 mL

## 2021-10-14 MED ORDER — METHYLPREDNISOLONE ACETATE 40 MG/ML IJ SUSP
80.0000 mg | INTRAMUSCULAR | Status: AC | PRN
  Administered 2021-10-14: 80 mg via INTRA_ARTICULAR

## 2021-10-14 NOTE — Progress Notes (Signed)
Office Visit Note   Patient: Sandra Miller           Date of Birth: 1975/11/02           MRN: [id number] Visit Date: 10/14/2021              Requested by: Ladell Pier, MD [address] [place],  [place] [address] PCP: Ladell Pier, MD  Chief Complaint  Patient presents with   Left Knee - Pain      HPI: Patient is a pleasant 46 year old woman with a chief complaint of left knee pain.  She denies any particular injury.  She did say she had an arthroscopy several years ago on her knee.  She presented for the same pain last summer with Dr. Junius Roads.  He recommended glucosamine/chondroitin and follow-up with a possible injection.  At that time she was going through treatment for gynecological cancer.  She also had a complication of a clot in her jugular vein and is on anticoagulation.  She feels her knee is getting more swollen.  She does wear a knee brace.  Assessment & Plan: Visit Diagnoses:  1. Chronic pain of left knee     Plan: Left knee pain now going on for 7 months.  I attempted an aspiration today could not aspirate any fluid which was surprising.  She has tenderness over the medial joint line with mechanical symptoms that have not been improved with rest brace and she cannot take anti-inflammatories.  We will go forward with an MRI.  Follow-Up Instructions: No follow-ups on file.   Ortho Exam  Patient is alert, oriented, no adenopathy, well-dressed, normal affect, normal respiratory effort. Examination of her left knee she does seem to have soft tissue swelling and palpable effusion.  She has no redness no cellulitis.  She has tenderness over the medial joint line.  Tenderness with terminal extension and flexion  Imaging: XR Knee 1-2 Views Left  Result Date: 10/14/2021 Radiographs of her left knee demonstrate overall well-maintained alignment.  Overall well preserved joint spacing.  Do not see any osteophyte formation cannot appreciate any acute osseous changes   No images are attached to the encounter.  Labs: Lab Results  Component Value Date   HGBA1C 5.7 08/06/2021   HGBA1C 6.1 (H) 05/06/2021   HGBA1C 5.8 (H) 08/11/2020   LABURIC 4.6 02/13/2009   REPTSTATUS 04/12/2020 FINAL 04/11/2020   GRAMSTAIN  04/21/2009    FEW WBC PRESENT, PREDOMINANTLY PMN NO SQUAMOUS EPITHELIAL CELLS SEEN FEW GRAM POSITIVE COCCI IN PAIRS   CULT (A) 04/11/2020    20,000 COLONIES/mL GROUP B STREP(S.AGALACTIAE)ISOLATED TESTING AGAINST S. AGALACTIAE NOT ROUTINELY PERFORMED DUE TO PREDICTABILITY OF AMP/PEN/VAN SUSCEPTIBILITY. Performed at [facility], [place] [address] [place], [place] [address]    LABORGA ESCHERICHIA COLI (A) 03/08/2016     Lab Results  Component Value Date   ALBUMIN 3.9 07/01/2021   ALBUMIN 4.2 05/06/2021   ALBUMIN 4.2 04/06/2021    Lab Results  Component Value Date   MG 0.7 (LL) 11/16/2020   MG 0.7 (LL) 08/11/2020   MG 0.7 (LL) 06/26/2020   Lab Results  Component Value Date   VD25OH 37.0 11/16/2020   VD25OH 24.4 (L) 08/11/2020   VD25OH 32.5 10/09/2018    No results found for: PREALBUMIN CBC EXTENDED Latest Ref Rng & Units 07/01/2021 05/06/2021 04/06/2021  WBC 4.0 - 10.5 K/uL 7.1 8.0 8.7  RBC 3.87 - 5.11 MIL/uL 3.93 4.06 4.15  HGB 12.0 -  g/dL 11.3(L) 12.0 12.2  °HCT 36.0 - 46.0 % 34.3(L) 36.6 36.3  °PLT 150 - 400 K/uL 296 287 315  °NEUTROABS 1.7 - 7.7 K/uL 4.5 - 5.5  °LYMPHSABS 0.7 - 4.0 K/uL 1.7 - 2.4  ° ° ° °There is no height or weight on file to calculate BMI. ° °Orders:  °Orders Placed This Encounter  °Procedures  ° XR Knee 1-2 Views Left  ° MR Knee Left w/o contrast  ° °No orders of the defined types were placed in this encounter. ° ° ° Procedures: °Large Joint Inj: L knee on 10/14/2021 9:39 AM °Indications: pain and diagnostic evaluation °Details: 25 G 1.5 in needle, anteromedial approach ° °Arthrogram: No ° °Medications: 80 mg methylPREDNISolone acetate 40 MG/ML; 5 mL lidocaine 1 % °Outcome: tolerated well, no immediate  complications °Procedure, treatment alternatives, risks and benefits explained, specific risks discussed. Consent was given by the patient.  ° ° ° °Clinical Data: °No additional findings. ° °ROS: ° °All other systems negative, except as noted in the HPI. °Review of Systems ° °Objective: °Vital Signs: There were no vitals taken for this visit. ° °Specialty Comments:  °No specialty comments available. ° °PMFS History: °Patient Active Problem List  ° Diagnosis Date Noted  ° Hyperthyroidism 08/13/2021  ° Bipolar 1 disorder (HCC) 05/07/2021  ° Class 2 obesity in adult 05/07/2021  ° Hyperlipidemia 08/12/2020  ° Pre-diabetes 08/12/2020  ° Hypomagnesemia 08/12/2020  ° Mutation in BRIP1 gene 06/24/2020  ° Hypokalemia 06/17/2020  ° Dehydration   ° DVT (deep venous thrombosis) (HCC) 06/16/2020  ° Colitis 06/16/2020  ° Acute embolism and thrombosis of right internal jugular vein (HCC) 06/12/2020  ° Port-A-Cath in place 03/11/2020  ° Malignant neoplasm of upper-inner quadrant of left breast in female, estrogen receptor positive (HCC) 02/25/2020  ° Vulvar cancer (HCC) 01/29/2020  ° Vulvar ulceration 01/23/2020  ° Essential hypertension 10/31/2019  ° Nausea and vomiting 12/27/2018  ° Seasonal allergic rhinitis 12/27/2018  ° History of ELISA positive for HSV 10/01/2018  ° Restless leg syndrome 04/01/2016  ° Nephrolithiasis 03/08/2016  ° Obstructive pyelonephritis 03/08/2016  ° Normocytic anemia 03/08/2016  ° Hidradenitis suppurativa of left axilla 11/03/2015  ° Loss of weight 09/08/2015  ° Neck pain on left side 10/22/2014  ° Genital warts 05/20/2014  ° GERD (gastroesophageal reflux disease) 12/26/2012  ° Functional dyspepsia 12/26/2012  ° Tobacco abuse 08/22/2012  ° Herpes simplex virus (HSV) infection 02/03/2007  ° °Past Medical History:  °Diagnosis Date  ° Arthritis   ° Bipolar 1 disorder (HCC)   ° Cancer (HCC)   ° Complication of anesthesia   ° wakes up during procedures  ° Depression   ° GAD (generalized anxiety disorder)   °  Genital HSV   ° currently per pt  no break out 03-22-2016   ° GERD (gastroesophageal reflux disease)   ° Hiatal hernia   ° History of cervical dysplasia   ° 2012 laser ablation  ° History of esophageal dilatation   ° for dysphasia -- x2 dilated  ° History of gastric ulcer   ° History of Helicobacter pylori infection   ° remote hx  ° History of hidradenitis suppurativa   ° "gets all over body intermittantly"    ° History of hypertension   ° no issue since stopped drinking alcohol 2014  ° History of kidney stones   ° History of panic attacks   ° History of radiation therapy 03/16/2020-05/08/2020  ° vulva  Dr James Kinard  ° History of   of radiation therapy 09/09/2020-10/23/2020   left chest wall/left SCV   Dr Gery Pray   Hypertension    Iron deficiency anemia    Left ureteral stone    OCD (obsessive compulsive disorder)    PONV (postoperative nausea and vomiting)    Pre-diabetes    PTSD (post-traumatic stress disorder)    Recovering alcoholic in remission ([place])    since 2014   RLS (restless legs syndrome)    Smokers' cough ([place])    Urgency of urination    Yeast infection involving the vagina and surrounding area    secondary to taking antibiotic    Family History  Problem Relation Age of Onset   Heart disease Father    Lung cancer Father        d. 50   Alcohol abuse Father    Heart disease Mother    Depression Mother    Anxiety disorder Mother    Drug abuse Brother    Alcohol abuse Brother    Drug abuse Brother    ADD / ADHD Brother    Colon polyps Brother    Cancer Paternal Grandfather        "stomach"   Diabetes Maternal Grandfather    Diabetes Paternal Grandmother    Kidney disease Maternal Uncle    Cirrhosis Cousin        alcoholic   Anxiety disorder Maternal Aunt    Depression Maternal Aunt    Cancer Cousin        maternal; ovarian cancer or other "female" cancer?   Lung cancer Paternal Uncle 66   Throat cancer Cousin        paternal; dx 78s   Lung cancer Cousin         paternal; dx 67s    Past Surgical History:  Procedure Laterality Date   CESAREAN SECTION  1995   w/  Bilateral Tubal Ligation   COLONOSCOPY  last one 08-09-2013   CYSTOSCOPY W/ URETERAL STENT PLACEMENT Left 03/29/2016   Procedure: CYSTOSCOPY WITH STENT REPLACEMENT;  Surgeon: Nickie Retort, MD;  Location: [facility];  Service: Urology;  Laterality: Left;   CYSTOSCOPY WITH RETROGRADE PYELOGRAM, URETEROSCOPY AND STENT PLACEMENT Left 03/08/2016   Procedure: CYSTOSCOPY WITH  LEFT RETROGRADE PYELOGRAM, AND STENT PLACEMENT;  Surgeon: Nickie Retort, MD;  Location: [facility];  Service: Urology;  Laterality: Left;   CYSTOSCOPY/RETROGRADE/URETEROSCOPY/STONE EXTRACTION WITH BASKET Left 03/29/2016   Procedure: CYSTOSCOPY/RETROGRADE/URETEROSCOPY/STONE EXTRACTION WITH BASKET;  Surgeon: Nickie Retort, MD;  Location[facility];  Service: Urology;  Laterality: Left;   ENDOMETRIAL ABLATION W/ NOVASURE  04-01-2010   ESOPHAGOGASTRODUODENOSCOPY  last one 08-09-2013   KNEE ARTHROSCOPY Left as teen   LASER ABLATION OF THE CERVIX  2012 approx   MASTECTOMY WITH AXILLARY LYMPH NODE DISSECTION Left 07/27/2020   Procedure: LEFT MASTECTOMY WITH LEFT RADIOACTIVE SEED GUIDED TARGETED AXILLARY LYMPH NODE DISSECTION;  Surgeon: Alphonsa Overall, MD;  Location: [place];  Service: General;  Laterality: Left;   PORT-A-CATH REMOVAL Right 05/18/2021   Procedure: REMOVAL PORT-A-CATH;  Surgeon: Donnie Mesa, MD;  Location: [facility];  Service: General;  Laterality: Right;   PORTACATH PLACEMENT Right 03/06/2020   Procedure: INSERTION PORT-A-CATH WITH ULTRASOUND GUIDANCE;  Surgeon: Alphonsa Overall, MD;  Location: [place];  Service: General;  Laterality: Right;   ROBOTIC ASSISTED TOTAL HYSTERECTOMY WITH BILATERAL SALPINGO OOPHERECTOMY N/A 05/18/2021   Procedure: XI ROBOTIC ASSISTED TOTAL HYSTERECTOMY WITH BILATERAL SALPINGO OOPHORECTOMY;  Surgeon: Everitt Amber, MD;  Location:  [facility];   Service: Gynecology;  Laterality: N/A;   TRANSTHORACIC ECHOCARDIOGRAM  05-19-2006   lvsf normal, ef 55-65%, there was mild flattening of the interventricular septum during diastoli/  RV size at upper limits normal   TUBAL LIGATION     VULVA /PERINEUM BIOPSY N/A 05/18/2021   Procedure: VULVAR BIOPSY;  Surgeon: Everitt Amber, MD;  Location: [facility];  Service: Gynecology;  Laterality: N/A;   WISDOM TOOTH EXTRACTION  age 59 's   Social History   Occupational History   Not on file  Tobacco Use   Smoking status: Every Day    Packs/day: 0.50    Years: 22.00    Pack years: 11.00    Types: Cigarettes   Smokeless tobacco: Never   Tobacco comments:    Recently started a smoking cessation class.   Vaping Use   Vaping Use: Never used  Substance and Sexual Activity   Alcohol use: Not Currently    Alcohol/week: 0.0 standard drinks    Comment:  hx alcohollism  in remission since 2014   Drug use: Yes    Types: Marijuana    Comment: occas.   Sexual activity: Yes    Partners: Male    Birth control/protection: Surgical

## 2021-10-19 ENCOUNTER — Telehealth: Payer: Self-pay | Admitting: Internal Medicine

## 2021-10-19 ENCOUNTER — Encounter (HOSPITAL_COMMUNITY): Payer: Self-pay

## 2021-10-19 NOTE — Telephone Encounter (Signed)
Pt states her insurance requires her to see [facility].  Pt requesting the referral she has to [facility] be canceled, and resent to [facility] please.

## 2021-10-25 ENCOUNTER — Other Ambulatory Visit: Payer: Self-pay | Admitting: Internal Medicine

## 2021-10-26 ENCOUNTER — Other Ambulatory Visit (HOSPITAL_COMMUNITY): Payer: Self-pay

## 2021-10-26 MED ORDER — METHIMAZOLE 10 MG PO TABS
10.0000 mg | ORAL_TABLET | Freq: Two times a day (BID) | ORAL | 1 refills | Status: DC
  Filled 2021-10-26: qty 60, 30d supply, fill #0
  Filled 2021-12-03: qty 60, 30d supply, fill #1

## 2021-10-26 MED ORDER — PROPRANOLOL HCL 10 MG PO TABS
10.0000 mg | ORAL_TABLET | Freq: Two times a day (BID) | ORAL | 0 refills | Status: DC
  Filled 2021-10-26: qty 180, 90d supply, fill #0

## 2021-10-26 NOTE — Telephone Encounter (Signed)
Requested Prescriptions  Pending Prescriptions Disp Refills   methimazole (TAPAZOLE) 10 MG tablet 60 tablet 1    Sig: Take 1 tablet (10 mg total) by mouth 2 (two) times daily.     Not Delegated - Endocrinology:  Hyperthyroid Agents Failed - 10/25/2021  7:52 AM      Failed - This refill cannot be delegated      Failed - TSH in normal range and within 180 days    TSH  Date Value Ref Range Status  08/06/2021 <0.005 (L) 0.450 - 4.500 uIU/mL Final         Failed - T3 Total in normal range and within 180 days    T3, Free  Date Value Ref Range Status  08/06/2021 21.7 (HH) 2.0 - 4.4 pg/mL Final         Failed - T4 free in normal range and within 180 days    Free T4  Date Value Ref Range Status  08/06/2021 4.86 (H) 0.82 - 1.77 ng/dL Final         Passed - Valid encounter within last 6 months    Recent Outpatient Visits          2 months ago Essential hypertension   [place] Ladell Pier, MD   11 months ago Hypomagnesemia   [facility] [facility], Laurita Quint, FNP   1 year ago Essential hypertension   [facility] [place] [facility], Laurita Quint, FNP   1 year ago Herpes simplex virus (HSV) infection   [facility] at [facility], Lilia Argue, MD   2 years ago Nausea   [facility] at [facility], Lilia Argue, MD      Future Appointments            In 1 week Garald Balding, MD [facility]   In 1 month Ladell Pier, MD [place]            propranolol (INDERAL) 10 MG tablet 60 tablet 1    Sig: Take 1 tablet (10 mg total) by mouth 2 (two) times daily.     Cardiovascular:  Beta Blockers Passed - 10/25/2021  7:52 AM      Passed - Last BP in normal range    BP Readings from Last 1 Encounters:  09/30/21 139/81         Passed - Last Heart Rate in normal range    Pulse Readings from Last 1 Encounters:  09/30/21 98         Passed - Valid encounter within last 6 months     Recent Outpatient Visits          2 months ago Essential hypertension   [place], MD   11 months ago Hypomagnesemia   [facility] [place] [facility] Laurita Quint, FNP   1 year ago Essential hypertension   [facility] [place] [facility], Laurita Quint, FNP   1 year ago Herpes simplex virus (HSV) infection   [facility] at [facility], Lilia Argue, MD   2 years ago Nausea   [facility], Lilia Argue, MD      Future Appointments            In 1 week Dur[place] Fortes Vonna Kotyk, MD [facility]   In 1 month Ladell Pier, MD [facility]

## 2021-10-26 NOTE — Telephone Encounter (Signed)
Requested medication (s) are due for refill today: yes  Requested medication (s) are on the active medication list: yes  Last refill:  08/13/21 #60/1  Future visit scheduled: yes  Notes to clinic:  Unable to refill per protocol, cannot delegate.      Requested Prescriptions  Pending Prescriptions Disp Refills   methimazole (TAPAZOLE) 10 MG tablet 60 tablet 1    Sig: Take 1 tablet (10 mg total) by mouth 2 (two) times daily.     Not Delegated - Endocrinology:  Hyperthyroid Agents Failed - 10/25/2021  7:52 AM      Failed - This refill cannot be delegated      Failed - TSH in normal range and within 180 days    TSH  Date Value Ref Range Status  08/06/2021 <0.005 (L) 0.450 - 4.500 uIU/mL Final          Failed - T3 Total in normal range and within 180 days    T3, Free  Date Value Ref Range Status  08/06/2021 21.7 (HH) 2.0 - 4.4 pg/mL Final          Failed - T4 free in normal range and within 180 days    Free T4  Date Value Ref Range Status  08/06/2021 4.86 (H) 0.82 - 1.77 ng/dL Final          Passed - Valid encounter within last 6 months    Recent Outpatient Visits           2 months ago Essential hypertension   [place] Ladell Pier, MD   11 months ago Hypomagnesemia   [facility] [place] [facility], Laurita Quint, FNP   1 year ago Essential hypertension   [facility] [place] [facility], Laurita Quint, FNP   1 year ago Herpes simplex virus (HSV) infection   [facility] at [facility], Lilia Argue, MD   2 years ago Nausea   [facility] at [facility], Lilia Argue, MD       Future Appointments             In 1 week Garald Balding, MD [facility]   In 1 month Ladell Pier, MD [place]            Signed Prescriptions Disp Refills   propranolol (INDERAL) 10 MG tablet 180 tablet 0    Sig: Take 1 tablet (10 mg total) by mouth 2 (two) times daily.     Cardiovascular:   Beta Blockers Passed - 10/25/2021  7:52 AM      Passed - Last BP in normal range    BP Readings from Last 1 Encounters:  09/30/21 139/81          Passed - Last Heart Rate in normal range    Pulse Readings from Last 1 Encounters:  09/30/21 98          Passed - Valid encounter within last 6 months    Recent Outpatient Visits           2 months ago Essential hypertension   [place], MD   11 months ago Hypomagnesemia   [facility] [place] [facility] Laurita Quint, FNP   1 year ago Essential hypertension   [facility] [place] [facility], Laurita Quint, FNP   1 year ago Herpes simplex virus (HSV) infection   Primary Care at [facility]  Claudie Leach, MD   2 years ago Nausea   [facility] at [facility], Lilia Argue, MD       Future Appointments             In 1 week [place]es Vonna Kotyk, MD [facility]   In 1 month Ladell Pier, MD [place]

## 2021-10-27 NOTE — Assessment & Plan Note (Signed)
Breast cancer and vulvar cancers  02/20/2020:PET scan on 02/07/20 following a diagnosis of vulvar cancer showed a 1.7cm left breast mass and mildly hypermetabolic left axillary lymph nodes. Mammogram and [place] on 02/18/20 showed a 3.1cm left breast mass at the 11 o'clock position, palpable on exam, and a single abnormal left axillary lymph node with cortical thickening. Left breast biopsy on 02/20/20 showed invasive and in situ carcinoma in the breast and axilla, grade 3, HER-2 positive (3+), ER/PR+ 95%, Ki67 75%  Treatment plan: 1.Neoadjuvant chemoradiation for vulvar cancer with Taxol, carboplatin, Herceptin weekly x6 cycles 2.neoadjuvant chemotherapy with TCH Perjeta x3 cycles 3.07/27/20:Left mastectomy [name]): three foci of IDC, 3.7cm, 1.5cm, and 1.2cm, with intermediate to high grade DCIS, clear margins, with 1/2 left axillary lymph nodes positive for carcinoma. 4. Followed by adjuvant radiation therapyto be completed on 10/23/20 5.Kadcyla maintenance completed 04/06/2021 6.Adjuvant antiestrogen therapywith anastrozole started 3/29/2022switched to letrozole 02/23/2021 7.Adjuvant neratinib ------------------------------------------------------------------------------------------------------------------- Current treatment: Letrozole (held for hot flashes) Letrozole toxicities: 1.  Severe hot flashes (especially since she underwent hysterectomy and bilateral salpingo-oophorectomy in September 2022) Currently on gabapentin and Effexor 2. nausea: I sent a prescription for Phenergan. 3.  Weight loss: Due to inadequate oral intake. 4.  Abdominal pain: CT scans are happening on 07/28/2021. 5.  Depression: Sees psychiatry.  I will request our counselor/chaplain Lattie Haw to counsel her   We stopped letrozole because of her systemic symptoms of depression, nausea, profound fatigue and hot flashes: We may consider resuming this medicine in 3 months Return to clinic in 3 months for follow-up

## 2021-10-27 NOTE — Progress Notes (Signed)
Patient Care Team: Ladell Pier, MD as PCP - General (Internal Medicine) Nicholas Lose, MD as Consulting Physician (Hematology and Oncology) Alphonsa Overall, MD as Consulting Physician (General Surgery) Awanda Mink Craige Cotta, RN as Oncology Nurse Navigator (Oncology) Everitt Amber, MD as Consulting Physician (Gynecologic Oncology) Dorothyann Gibbs, NP as Nurse Practitioner (Gynecologic Oncology) Gery Pray, MD as Consulting Physician (Radiation Oncology) Harmon Pier, RN as Registered Nurse Lahoma Crocker, MD as Consulting Physician (Obstetrics and Gynecology)  DIAGNOSIS:    ICD-10-CM   1. Malignant neoplasm of upper-inner quadrant of left breast in female, estrogen receptor positive ([place])  C50.212    Z17.0       SUMMARY OF ONCOLOGIC HISTORY: Oncology History  Vulvar cancer ([place])  01/29/2020 Initial Diagnosis   Vulvar cancer ([place])   03/11/2020 - 05/04/2020 Chemotherapy   The patient had dexamethasone (DECADRON) 4 MG tablet, 4 mg (100 % of original dose 4 mg), Oral, Daily, 1 of 1 cycle, Start date: 02/27/2020, End date: 05/05/2020 Dose modification: 4 mg (original dose 4 mg, Cycle 0) palonosetron (ALOXI) injection 0.25 mg, 0.25 mg, Intravenous,  Once, 1 of 1 cycle Administration: 0.25 mg (03/11/2020), 0.25 mg (03/17/2020), 0.25 mg (03/24/2020), 0.25 mg (03/31/2020), 0.25 mg (04/07/2020), 0.25 mg (04/14/2020), 0.25 mg (04/21/2020) CARBOplatin (PARAPLATIN) 300 mg in sodium chloride 0.9 % 250 mL chemo infusion, 300 mg (100 % of original dose 300 mg), Intravenous,  Once, 1 of 1 cycle Dose modification:   (original dose 300 mg, Cycle 1) Administration: 300 mg (03/11/2020), 290 mg (03/17/2020), 300 mg (03/24/2020), 230 mg (03/31/2020), 230 mg (04/07/2020), 230 mg (04/14/2020) PACLitaxel (TAXOL) 108 mg in sodium chloride 0.9 % 250 mL chemo infusion (</= $RemoveBefor'80mg'aYzmZJoVqCai$ /m2), 50 mg/m2 = 108 mg, Intravenous,  Once, 1 of 1 cycle Dose modification: 45 mg/m2 (original dose 50 mg/m2, Cycle 1, Reason: Dose not  tolerated) Administration: 108 mg (03/11/2020), 108 mg (03/17/2020), 108 mg (03/24/2020), 96 mg (03/31/2020), 96 mg (04/07/2020), 96 mg (04/14/2020) fosaprepitant (EMEND) 150 mg in sodium chloride 0.9 % 145 mL IVPB, 150 mg, Intravenous,  Once, 1 of 1 cycle Administration: 150 mg (03/24/2020), 150 mg (03/31/2020), 150 mg (04/07/2020), 150 mg (04/14/2020) trastuzumab-anns (KANJINTI) 378 mg in sodium chloride 0.9 % 250 mL chemo infusion, 4 mg/kg = 378 mg (100 % of original dose 4 mg/kg), Intravenous,  Once, 1 of 1 cycle Dose modification: 4 mg/kg (original dose 4 mg/kg, Cycle 1, Reason: Other (see comments), Comment: loading dose) Administration: 378 mg (03/11/2020), 189 mg (03/17/2020), 189 mg (03/24/2020), 189 mg (03/31/2020), 189 mg (04/07/2020), 189 mg (04/14/2020), 189 mg (04/21/2020), 189 mg (05/04/2020)   for chemotherapy treatment.     03/16/2020 - 05/08/2020 Radiation Therapy   Patient received 45 Gy in 25 treatments to pelvis Boost received 16.2 Gy in 9 treatments to vulva    Genetic Testing   Patient has genetic testing done for 06/22/2020. Results revealed patient has the following mutation(s): BRIP 1 mutation   Malignant neoplasm of upper-inner quadrant of left breast in female, estrogen receptor positive ([place])  02/20/2020 Initial Diagnosis   PET scan on 02/07/20 following a diagnosis of vulvar cancer showed a 1.7cm left breast mass and mildly hypermetabolic left axillary lymph nodes. Mammogram and [place] on 02/18/20 showed a 3.1cm left breast mass at the 11 o'clock position, palpable on exam, and a single abnormal left axillary lymph node with cortical thickening. Left breast biopsy on 02/20/20 showed invasive and in situ carcinoma in the breast and axilla, grade 3, HER-2 positive (3+), ER/PR+  95%, Ki67 75%   02/25/2020 Cancer Staging   Staging form: Breast, AJCC 8th Edition - Clinical stage from 02/25/2020: Stage IB (cT2, cN1, cM0, G3, ER+, PR+, HER2+) - Signed by Nicholas Lose, MD on 02/25/2020    05/29/2020 -  05/29/2020 Chemotherapy   The patient had palonosetron (ALOXI) injection 0.25 mg, 0.25 mg, Intravenous,  Once, 1 of 3 cycles Administration: 0.25 mg (05/29/2020) pegfilgrastim-jmdb (FULPHILA) injection 6 mg, 6 mg, Subcutaneous,  Once, 1 of 3 cycles CARBOplatin (PARAPLATIN) 580 mg in sodium chloride 0.9 % 250 mL chemo infusion, 580 mg (100 % of original dose 580 mg), Intravenous,  Once, 1 of 3 cycles Dose modification:   (original dose 580 mg, Cycle 1) Administration: 580 mg (05/29/2020) DOCEtaxel (TAXOTERE) 120 mg in sodium chloride 0.9 % 250 mL chemo infusion, 60 mg/m2 = 120 mg (100 % of original dose 60 mg/m2), Intravenous,  Once, 1 of 3 cycles Dose modification: 60 mg/m2 (original dose 60 mg/m2, Cycle 1, Reason: Provider Judgment), 50 mg/m2 (original dose 60 mg/m2, Cycle 2, Reason: Dose not tolerated) Administration: 120 mg (05/29/2020) fosaprepitant (EMEND) 150 mg in sodium chloride 0.9 % 145 mL IVPB, 150 mg, Intravenous,  Once, 1 of 3 cycles Administration: 150 mg (05/29/2020) pertuzumab (PERJETA) 420 mg in sodium chloride 0.9 % 250 mL chemo infusion, 420 mg (50 % of original dose 840 mg), Intravenous, Once, 1 of 3 cycles Dose modification: 420 mg (original dose 840 mg, Cycle 1, Reason: Provider Judgment) Administration: 420 mg (05/29/2020) trastuzumab-dkst (OGIVRI) 525 mg in sodium chloride 0.9 % 250 mL chemo infusion, 6 mg/kg = 525 mg (75 % of original dose 8 mg/kg), Intravenous,  Once, 1 of 3 cycles Dose modification: 6 mg/kg (original dose 8 mg/kg, Cycle 1, Reason: Provider Judgment) Administration: 525 mg (05/29/2020)   for chemotherapy treatment.     06/22/2020 Genetic Testing   Positive genetic testing: Heterozygous pathogenic variant detected in BRIP1 gene at c.2010dup (p.Glu671*).  Variant of uncertain significance detected in POLD1 at c.532G>A (p.Gly178Arg).  No other pathogenic variants detected in [facility] Common Hereditary Cancers Panel.  The report date is June 22, 2020.   The  Common Hereditary Cancers Panel offered by [facility] includes sequencing and/or deletion duplication testing of the following 48 genes: APC, ATM, AXIN2, BARD1, BMPR1A, BRCA1, BRCA2, BRIP1, CDH1, CDK4, CDKN2A (p14ARF), CDKN2A (p16INK4a), CHEK2, CTNNA1, DICER1, EPCAM (Deletion/duplication testing only), GREM1 (promoter region deletion/duplication testing only), KIT, MEN1, MLH1, MSH2, MSH3, MSH6, MUTYH, NBN, NF1, NHTL1, PALB2, PDGFRA, PMS2, POLD1, POLE, PTEN, RAD50, RAD51C, RAD51D, RNF43, SDHB, SDHC, SDHD, SMAD4, SMARCA4. STK11, TP53, TSC1, TSC2, and VHL.  The following genes were evaluated for sequence changes only: SDHA and HOXB13 c.251G>A variant only.   07/27/2020 Surgery   Left mastectomy [name]): three foci of IDC, 3.7cm, 1.5cm, and 1.2cm, with intermediate to high grade DCIS, clear margins, with 1/2 left axillary lymph nodes positive for carcinoma.    08/18/2020 -  Chemotherapy    Patient is on Treatment Plan: BREAST ADO-TRASTUZUMAB EMTANSINE [facility]) Q21D       09/09/2020 - 10/23/2020 Radiation Therapy   Adjuvant radiation     CHIEF COMPLIANT: Follow-up of left breast cancer and vulvar cancer   INTERVAL HISTORY: Sandra Miller is a 46 y.o. with above-mentioned history of left breast cancer and vulvar cancer who completed neoadjuvant chemotherapy, underwent a left mastectomy, radiation, and is currently on Kadcyla maintenance. She also has a history of DVT currently on Xarelto. She presents to the clinic today for follow-up.   ALLERGIES:  is allergic to dilaudid [hydromorphone hcl], depakote [divalproex sodium], minocycline, and aspirin.  MEDICATIONS:  Current Outpatient Medications  Medication Sig Dispense Refill   acetaminophen (TYLENOL) 500 MG tablet Take 2 tablets (1,000 mg total) by mouth every 6 (six) hours as needed for moderate pain or fever. Avoid tylenol until your liver function back to normal 30 tablet 0   benzonatate (TESSALON) 100 MG capsule Take 1 capsule (100 mg total) by  mouth 3 (three) times daily as needed. 30 capsule 0   Cholecalciferol (VITAMIN D3) 125 MCG (5000 UT) CAPS Take by mouth.     diclofenac Sodium (VOLTAREN) 1 % GEL Apply 4 grams topically 4  times daily as needed. 500 g 6   diphenoxylate-atropine (LOMOTIL) 2.5-0.025 MG tablet TAKE 1 TO 2 TABLETS BY MOUTH FOUR TIMES DAILY AS NEEDED FOR DIARRHEA 45 tablet 1   doxylamine, Sleep, (UNISOM) 25 MG tablet Take 25 mg by mouth at bedtime as needed for sleep.     gabapentin (NEURONTIN) 300 MG capsule Take 1 capsule (300 mg total) by mouth daily. 30 capsule 5   Glucosamine 750 MG TABS Take 750 mg by mouth in the morning and at bedtime.     ipratropium (ATROVENT) 0.03 % nasal spray Place 2 sprays into both nostrils every 12 (twelve) hours. 30 mL 0   lisinopril (ZESTRIL) 20 MG tablet TAKE 1/2 TABLET BY MOUTH ONCE A DAY 90 tablet 3   loratadine (CLARITIN) 10 MG tablet Take 10 mg by mouth daily.     LORazepam (ATIVAN) 1 MG tablet Take 1 tablet by mouth 3 times daily as needed for anxiety. 90 tablet 2   magnesium oxide (MAG-OX) 400 (241.3 Mg) MG tablet Take 1 tablet (400 mg total) by mouth 2 (two) times daily. 60 tablet 1   methimazole (TAPAZOLE) 10 MG tablet Take 1 tablet (10 mg total) by mouth 2 (two) times daily. 60 tablet 1   Multiple Vitamin (MULTIVITAMIN WITH MINERALS) TABS tablet Take 1 tablet by mouth daily.     nicotine (NICODERM CQ) 21 mg/24hr patch Place 1 patch (21 mg total) onto the skin daily. 28 patch 0   OLANZapine (ZYPREXA) 7.5 MG tablet Take 1 tablet (7.5 mg total) by mouth at bedtime. 30 tablet 2   omeprazole (PRILOSEC) 40 MG capsule TAKE 1 CAPSULE BY MOUTH EVERY MORNING AND EVERY NIGHT AT BEDTIME 60 capsule 3   promethazine (PHENERGAN) 25 MG tablet TAKE 1 TABLET BY MOUTH EVERY 6 HOURS AS NEEDED FOR NAUSEA 30 tablet 6   propranolol (INDERAL) 10 MG tablet Take 1 tablet by mouth 2 (two) times daily. 180 tablet 0   rivaroxaban (XARELTO) 20 MG TABS tablet TAKE 1 TABLET BY MOUTH DAILY WITH SUPPER. 30  tablet 6   rosuvastatin (CRESTOR) 10 MG tablet TAKE 1 TABLET BY MOUTH ONCE A DAY 90 tablet 3   senna-docusate (SENOKOT-S) 8.6-50 MG tablet Take 2 tablets by mouth at bedtime. For AFTER surgery only, do not take if having diarrhea 30 tablet 0   Turmeric 450 MG CAPS Take 450 mg by mouth in the morning and at bedtime.     venlafaxine XR (EFFEXOR-XR) 37.5 MG 24 hr capsule TAKE 3 CAPSULES BY MOUTH ONCE A DAY 90 capsule 2   No current facility-administered medications for this visit.   Facility-Administered Medications Ordered in Other Visits  Medication Dose Route Frequency Provider Last Rate Last Admin   LORazepam (ATIVAN) injection 0.5 mg  0.5 mg Intravenous Once Harle Stanford., PA-C  PHYSICAL EXAMINATION: ECOG PERFORMANCE STATUS: 1 - Symptomatic but completely ambulatory  Vitals:   10/28/21 1003  BP: 140/82  Pulse: 93  Resp: 18  Temp: 97.6 F (36.4 C)  SpO2: 100%   Filed Weights   10/28/21 1003  Weight: 187 lb 11.2 oz (85.1 kg)    BREAST: No palpable masses or nodules in either right or left breasts. No palpable axillary supraclavicular or infraclavicular adenopathy no breast tenderness or nipple discharge. (exam performed in the presence of a chaperone)  LABORATORY DATA:  I have reviewed the data as listed CMP Latest Ref Rng & Units 08/06/2021 07/01/2021 05/06/2021  Glucose 70 - 99 mg/dL - 102(H) 98  BUN 6 - 20 mg/dL - 14 11  Creatinine 0.44 - 1.00 mg/dL 0.50 0.67 0.74  Sodium 135 - 145 mmol/L - 139 136  Potassium 3.5 - 5.1 mmol/L - 3.9 3.5  Chloride 98 - 111 mmol/L - 104 101  CO2 22 - 32 mmol/L - 24 26  Calcium 8.9 - 10.3 mg/dL - 10.6(H) 9.4  Total Protein 6.5 - 8.1 g/dL - 7.6 7.6  Total Bilirubin 0.3 - 1.2 mg/dL - 0.6 0.4  Alkaline Phos 38 - 126 U/L - 64 69  AST 15 - 41 U/L - 27 28  ALT 0 - 44 U/L - 31 20    Lab Results  Component Value Date   WBC 7.1 07/01/2021   HGB 11.3 (L) 07/01/2021   HCT 34.3 (L) 07/01/2021   MCV 87.3 07/01/2021   PLT 296 07/01/2021    NEUTROABS 4.5 07/01/2021    ASSESSMENT & PLAN:  Malignant neoplasm of upper-inner quadrant of left breast in female, estrogen receptor positive ([place]) Breast cancer and vulvar cancers   02/20/2020:PET scan on 02/07/20 following a diagnosis of vulvar cancer showed a 1.7cm left breast mass and mildly hypermetabolic left axillary lymph nodes. Mammogram and [place] on 02/18/20 showed a 3.1cm left breast mass at the 11 o'clock position, palpable on exam, and a single abnormal left axillary lymph node with cortical thickening. Left breast biopsy on 02/20/20 showed invasive and in situ carcinoma in the breast and axilla, grade 3, HER-2 positive (3+), ER/PR+ 95%, Ki67 75%   Treatment plan: 1.  Neoadjuvant chemoradiation for vulvar cancer with Taxol, carboplatin, Herceptin weekly x6 cycles 2. neoadjuvant chemotherapy with TCH Perjeta x3 cycles 3. 07/27/20: Left mastectomy [name]): three foci of IDC, 3.7cm, 1.5cm, and 1.2cm, with intermediate to high grade DCIS, clear margins, with 1/2 left axillary lymph nodes positive for carcinoma. 4. Followed by adjuvant radiation therapy to be completed on 10/23/20 5.  Kadcyla maintenance completed 04/06/2021 6.  Adjuvant antiestrogen therapy with anastrozole started 12/01/2020 switched to letrozole 02/23/2021 7.  Adjuvant neratinib ------------------------------------------------------------------------------------------------------------------- Current treatment: Letrozole (held for hot flashes) Letrozole toxicities: 1.  Severe hot flashes   2.  Depression: Sees psychiatry.  I will request our counselor/chaplain Lattie Haw to counsel her   She has been off letrozole for 3 months.  It appears that the cause of all of her side effects was severe hyperthyroidism/Graves' disease.  She lost significant amount of weight.  3 weeks ago she was diagnosed with COVID and is still recovering.  Left jugular DVT: Started on anticoagulation October 2021: We will obtain a neck ultrasound and if  that is negative she can stop blood thinners.  Return to clinic in 6 months for follow-up    No orders of the defined types were placed in this encounter.  The patient has a good understanding of  the overall plan. she agrees with it. she will call with any problems that may develop before the next visit here.  Total time spent: 30 mins including face to face time and time spent for planning, charting and coordination of care  Rulon Eisenmenger, MD, MPH 10/28/2021  I, Thana Ates, am acting as scribe for Dr. Nicholas Lose.  I have reviewed the above documentation for accuracy and completeness, and I agree with the above.

## 2021-10-28 ENCOUNTER — Encounter: Payer: Self-pay | Admitting: Hematology and Oncology

## 2021-10-28 ENCOUNTER — Inpatient Hospital Stay: Payer: PRIVATE HEALTH INSURANCE | Attending: Gynecologic Oncology | Admitting: Hematology and Oncology

## 2021-10-28 ENCOUNTER — Other Ambulatory Visit: Payer: Self-pay

## 2021-10-28 ENCOUNTER — Other Ambulatory Visit: Payer: Self-pay | Admitting: *Deleted

## 2021-10-28 DIAGNOSIS — Z170 Estrogen receptor positive status [ER+]: Secondary | ICD-10-CM | POA: Insufficient documentation

## 2021-10-28 DIAGNOSIS — C519 Malignant neoplasm of vulva, unspecified: Secondary | ICD-10-CM | POA: Insufficient documentation

## 2021-10-28 DIAGNOSIS — Z9012 Acquired absence of left breast and nipple: Secondary | ICD-10-CM | POA: Diagnosis not present

## 2021-10-28 DIAGNOSIS — Z86718 Personal history of other venous thrombosis and embolism: Secondary | ICD-10-CM | POA: Diagnosis not present

## 2021-10-28 DIAGNOSIS — C50212 Malignant neoplasm of upper-inner quadrant of left female breast: Secondary | ICD-10-CM | POA: Insufficient documentation

## 2021-10-28 DIAGNOSIS — Z7901 Long term (current) use of anticoagulants: Secondary | ICD-10-CM | POA: Diagnosis not present

## 2021-10-28 DIAGNOSIS — Z79899 Other long term (current) drug therapy: Secondary | ICD-10-CM | POA: Diagnosis not present

## 2021-10-28 MED ORDER — LETROZOLE 2.5 MG PO TABS: 2.5000 mg | ORAL_TABLET | Freq: Every day | ORAL | 3 refills | Status: DC

## 2021-10-28 NOTE — Progress Notes (Signed)
Per MD request RN placed order for Left VAS [place] to f/u on left jugular DVT.  Appt scheduled, RN attempt x1 to contact pt.  No answer, left detailed message with date and time of appt.

## 2021-10-28 NOTE — Progress Notes (Signed)
Pt PCP wants to know how long she has to be on blood thinners

## 2021-10-29 ENCOUNTER — Telehealth: Payer: Self-pay | Admitting: Hematology and Oncology

## 2021-10-29 ENCOUNTER — Ambulatory Visit (HOSPITAL_COMMUNITY): Admission: RE | Admit: 2021-10-29 | Payer: PRIVATE HEALTH INSURANCE | Source: Ambulatory Visit

## 2021-10-29 ENCOUNTER — Other Ambulatory Visit (HOSPITAL_COMMUNITY): Payer: Self-pay

## 2021-10-29 ENCOUNTER — Other Ambulatory Visit: Payer: Self-pay | Admitting: Hematology and Oncology

## 2021-10-29 MED ORDER — LETROZOLE 2.5 MG PO TABS
2.5000 mg | ORAL_TABLET | Freq: Every day | ORAL | 3 refills | Status: DC
  Filled 2021-10-29: qty 90, 90d supply, fill #0
  Filled 2022-01-24: qty 90, 90d supply, fill #1
  Filled 2022-04-23: qty 90, 90d supply, fill #2
  Filled 2022-07-21: qty 90, 90d supply, fill #3

## 2021-10-29 NOTE — Telephone Encounter (Signed)
Scheduled appointment per 02/23 los. Left message.

## 2021-10-30 ENCOUNTER — Other Ambulatory Visit (HOSPITAL_COMMUNITY): Payer: Self-pay

## 2021-11-03 ENCOUNTER — Encounter: Payer: Self-pay | Admitting: Orthopaedic Surgery

## 2021-11-03 ENCOUNTER — Ambulatory Visit (HOSPITAL_COMMUNITY): Payer: PRIVATE HEALTH INSURANCE

## 2021-11-03 ENCOUNTER — Ambulatory Visit (INDEPENDENT_AMBULATORY_CARE_PROVIDER_SITE_OTHER): Payer: PRIVATE HEALTH INSURANCE | Admitting: Orthopaedic Surgery

## 2021-11-03 ENCOUNTER — Other Ambulatory Visit: Payer: Self-pay

## 2021-11-03 VITALS — Ht 65.0 in | Wt 190.0 lb

## 2021-11-03 DIAGNOSIS — M25562 Pain in left knee: Secondary | ICD-10-CM | POA: Diagnosis not present

## 2021-11-03 DIAGNOSIS — M25561 Pain in right knee: Secondary | ICD-10-CM

## 2021-11-03 DIAGNOSIS — G8929 Other chronic pain: Secondary | ICD-10-CM

## 2021-11-03 MED ORDER — LIDOCAINE HCL 1 % IJ SOLN
2.0000 mL | INTRAMUSCULAR | Status: AC | PRN
  Administered 2021-11-03: 2 mL

## 2021-11-03 NOTE — Progress Notes (Signed)
Office Visit Note   Patient: Sandra Miller           Date of Birth: 1975/09/19           MRN: [id number] Visit Date: 11/03/2021              Requested by: Ladell Pier, MD [address] [place],  [place] [address] PCP: Ladell Pier, MD   Assessment & Plan: Visit Diagnoses:  1. Chronic pain of left knee   2. Chronic pain of both knees     Plan: Ms. Levi was seen by Haynes Dage recently for evaluation of bilateral knee pain.  At the time she was having more trouble with the left than right knee.  Because of her chronic problem an MRI scan was obtained.  Both menisci were intact.  There was mild partial thickness defect of the femoral cartilage medially and also at the patellofemoral joint.  The lateral cartilage appeared to be intact.  There was a large joint effusion with a nonspecific synovitis but no erosions.  There were no ossified loose bodies there was a fair amount of noncalcified debris within the synovitis.  MRI scan is consistent with arthritis.  I will order a C-reactive protein, sed rate and RA factor.  I aspirated the knee of 60 cc of clear yellow fluid and will send this to the lab as well.  She has been using over-the-counter medicine which she will continue.  She recently had a cortisone injection so it is a little early to repeat that.  We will check her back in a couple weeks.  All questions were answered  Follow-Up Instructions: Return in about 1 month (around 12/04/2021).   Orders:  Orders Placed This Encounter  Procedures   Large Joint Inj: L knee   Sedimentation rate   C-reactive protein   Rheumatoid Factor   Synovial Fluid Analysis, Complete   No orders of the defined types were placed in this encounter.     Procedures: Large Joint Inj: L knee on 11/03/2021 9:52 AM Indications: pain and diagnostic evaluation Details: 25 G 1.5 in needle, medial approach  Arthrogram: No  Medications: 2 mL lidocaine 1 % Aspirate: 60 mL clear; sent for lab  analysis Procedure, treatment alternatives, risks and benefits explained, specific risks discussed. Consent was given by the patient. Patient was prepped and draped in the usual sterile fashion.      Clinical Data: No additional findings.   Subjective: Chief Complaint  Patient presents with   Left Knee - Follow-up    MRI review  Patient presents today for follow up on her left knee. She had an MRI and is here today for those results.  No change in symptoms.  Having bilateral knee pain but more so on the left than the right.  Presently in remission from  vulva and breast cancer  HPI  Review of Systems   Objective: Vital Signs: Ht 5' 5"  (1.651 m)    Wt 190 lb (86.2 kg)    BMI 31.62 kg/m   Physical Exam Constitutional:      Appearance: She is well-developed.  Eyes:     Pupils: Pupils are equal, round, and reactive to light.  Pulmonary:     Effort: Pulmonary effort is normal.  Skin:    General: Skin is warm and dry.  Neurological:     Mental Status: She is alert and oriented to person, place, and time.  Psychiatric:  Behavior: Behavior normal.    Ortho Exam left knee was minimally warm and there did appear to be an effusion.  There was some medial joint pain and little bit of patella crepitation full extension and flex at least 100 degrees without instability.  No popliteal pain.  No calf discomfort.  Painless range of motion both hips.  I aspirated 60 cc of clear yellow fluid with immediate relief of a lot of her pain.  There were small pieces of articular cartilage within the aspirate.  Specialty Comments:  No specialty comments available.  Imaging: No results found.   PMFS History: Patient Active Problem List   Diagnosis Date Noted   Bilateral knee pain 11/03/2021   Hyperthyroidism 08/13/2021   Bipolar 1 disorder ([place]) 05/07/2021   Class 2 obesity in adult 05/07/2021   Hyperlipidemia 08/12/2020   Pre-diabetes 08/12/2020   Hypomagnesemia 08/12/2020    Mutation in [place] gene 06/24/2020   Hypokalemia 06/17/2020   Dehydration    DVT (deep venous thrombosis) ([place]) 06/16/2020   Colitis 06/16/2020   Acute embolism and thrombosis of right internal jugular vein ([place]) 06/12/2020   Port-A-Cath in place 03/11/2020   Malignant neoplasm of upper-inner quadrant of left breast in female, estrogen receptor positive ([place]) 02/25/2020   Vulvar cancer ([place]) 01/29/2020   Vulvar ulceration 01/23/2020   Essential hypertension 10/31/2019   Nausea and vomiting 12/27/2018   Seasonal allergic rhinitis 12/27/2018   History of ELISA positive for HSV 10/01/2018   Restless leg syndrome 04/01/2016   Nephrolithiasis 03/08/2016   Obstructive pyelonephritis 03/08/2016   Normocytic anemia 03/08/2016   Hidradenitis suppurativa of left axilla 11/03/2015   Loss of weight 09/08/2015   Neck pain on left side 10/22/2014   Genital warts 05/20/2014   GERD (gastroesophageal reflux disease) 12/26/2012   Functional dyspepsia 12/26/2012   Tobacco abuse 08/22/2012   Herpes simplex virus (HSV) infection 02/03/2007   Past Medical History:  Diagnosis Date   Arthritis    Bipolar 1 disorder ([place])    Cancer ([place])    Complication of anesthesia    wakes up during procedures   Depression    GAD (generalized anxiety disorder)    Genital HSV    currently per pt  no break out 03-22-2016    GERD (gastroesophageal reflux disease)    Hiatal hernia    History of cervical dysplasia    2012 laser ablation   History of esophageal dilatation    for dysphasia -- x2 dilated   History of gastric ulcer    History of Helicobacter pylori infection    remote hx   History of hidradenitis suppurativa    "gets all over body intermittantly"     History of hypertension    no issue since stopped drinking alcohol 2014   History of kidney stones    History of panic attacks    History of radiation therapy 03/16/2020-05/08/2020   vulva  Dr Gery Pray   History of radiation therapy  09/09/2020-10/23/2020   left chest wall/left SCV   Dr Gery Pray   Hypertension    Iron deficiency anemia    Left ureteral stone    OCD (obsessive compulsive disorder)    PONV (postoperative nausea and vomiting)    Pre-diabetes    PTSD (post-traumatic stress disorder)    Recovering alcoholic in remission ([place])    since 2014   RLS (restless legs syndrome)    Smokers' cough ([place])    Urgency of urination    Yeast infection involving  the vagina and surrounding area    secondary to taking antibiotic    Family History  Problem Relation Age of Onset   Heart disease Father    Lung cancer Father        d. 51   Alcohol abuse Father    Heart disease Mother    Depression Mother    Anxiety disorder Mother    Drug abuse Brother    Alcohol abuse Brother    Drug abuse Brother    ADD / ADHD Brother    Colon polyps Brother    Cancer Paternal Grandfather        "stomach"   Diabetes Maternal Grandfather    Diabetes Paternal Grandmother    Kidney disease Maternal Uncle    Cirrhosis Cousin        alcoholic   Anxiety disorder Maternal Aunt    Depression Maternal Aunt    Cancer Cousin        maternal; ovarian cancer or other "female" cancer?   Lung cancer Paternal Uncle 69   Throat cancer Cousin        paternal; dx 85s   Lung cancer Cousin        paternal; dx 78s    Past Surgical History:  Procedure Laterality Date   CESAREAN SECTION  1995   w/  Bilateral Tubal Ligation   COLONOSCOPY  last one 08-09-2013   CYSTOSCOPY W/ URETERAL STENT PLACEMENT Left 03/29/2016   Procedure: CYSTOSCOPY WITH STENT REPLACEMENT;  Surgeon: Nickie Retort, MD;  Location[facility];  Service: Urology;  Laterality: Left;   CYSTOSCOPY WITH RETROGRADE PYELOGRAM, URETEROSCOPY AND STENT PLACEMENT Left 03/08/2016   Procedure: CYSTOSCOPY WITH  LEFT RETROGRADE PYELOGRAM, AND STENT PLACEMENT;  Surgeon: Nickie Retort, MD;  Location: [facility];  Service: Urology;  Laterality: Left;    CYSTOSCOPY/RETROGRADE/URETEROSCOPY/STONE EXTRACTION WITH BASKET Left 03/29/2016   Procedure: CYSTOSCOPY/RETROGRADE/URETEROSCOPY/STONE EXTRACTION WITH BASKET;  Surgeon: Nickie Retort, MD;  Location[facility];  Service: Urology;  Laterality: Left;   ENDOMETRIAL ABLATION W/ NOVASURE  04-01-2010   ESOPHAGOGASTRODUODENOSCOPY  last one 08-09-2013   KNEE ARTHROSCOPY Left as teen   LASER ABLATION OF THE CERVIX  2012 approx   MASTECTOMY WITH AXILLARY LYMPH NODE DISSECTION Left 07/27/2020   Procedure: LEFT MASTECTOMY WITH LEFT RADIOACTIVE SEED GUIDED TARGETED AXILLARY LYMPH NODE DISSECTION;  Surgeon: Alphonsa Overall, MD;  Location: [facility];  Service: General;  Laterality: Left;   PORT-A-CATH REMOVAL Right 05/18/2021   Procedure: REMOVAL PORT-A-CATH;  Surgeon: Donnie Mesa, MD;  Location: [facility];  Service: General;  Laterality: Right;   PORTACATH PLACEMENT Right 03/06/2020   Procedure: INSERTION PORT-A-CATH WITH ULTRASOUND GUIDANCE;  Surgeon: Alphonsa Overall, MD;  Location: [facility];  Service: General;  Laterality: Right;   ROBOTIC ASSISTED TOTAL HYSTERECTOMY WITH BILATERAL SALPINGO OOPHERECTOMY N/A 05/18/2021   Procedure: XI ROBOTIC ASSISTED TOTAL HYSTERECTOMY WITH BILATERAL SALPINGO OOPHORECTOMY;  Surgeon: Everitt Amber, MD;  Location: [facility];  Service: Gynecology;  Laterality: N/A;   TRANSTHORACIC ECHOCARDIOGRAM  05-19-2006   lvsf normal, ef 55-65%, there was mild flattening of the interventricular septum during diastoli/  RV size at upper limits normal   TUBAL LIGATION     VULVA /PERINEUM BIOPSY N/A 05/18/2021   Procedure: VULVAR BIOPSY;  Surgeon: Everitt Amber, MD;  Location: [facility];  Service: Gynecology;  Laterality: N/A;   WISDOM TOOTH EXTRACTION  age 47 's   Social History   Occupational History   Not on file  Tobacco Use   Smoking status: Every Day    Packs/day: 0.50    Years: 22.00    Pack years: 11.00    Types: Cigarettes   Smokeless tobacco: Never   Tobacco  comments:    Recently started a smoking cessation class.   Vaping Use   Vaping Use: Never used  Substance and Sexual Activity   Alcohol use: Not Currently    Alcohol/week: 0.0 standard drinks    Comment:  hx alcohollism  in remission since 2014   Drug use: Yes    Types: Marijuana    Comment: occas.   Sexual activity: Yes    Partners: Male    Birth control/protection: Surgical

## 2021-11-04 ENCOUNTER — Ambulatory Visit (HOSPITAL_COMMUNITY)
Admission: RE | Admit: 2021-11-04 | Discharge: 2021-11-04 | Disposition: A | Discharge status: Home / Self Care | Payer: PRIVATE HEALTH INSURANCE | Source: Ambulatory Visit | Attending: Hematology and Oncology | Admitting: Hematology and Oncology

## 2021-11-04 DIAGNOSIS — C50212 Malignant neoplasm of upper-inner quadrant of left female breast: Secondary | ICD-10-CM

## 2021-11-04 DIAGNOSIS — Z170 Estrogen receptor positive status [ER+]: Secondary | ICD-10-CM

## 2021-11-04 LAB — SYNOVIAL FLUID ANALYSIS, COMPLETE
Basophils, %: 0 %
Eosinophils-Synovial: 0 % (ref 0–2)
Lymphocytes-Synovial Fld: 48 % (ref 0–74)
Monocyte/Macrophage: 39 % (ref 0–69)
Neutrophil, Synovial: 10 % (ref 0–24)
Synoviocytes, %: 3 % (ref 0–15)
WBC, Synovial: 144 cells/uL (ref <150)

## 2021-11-04 LAB — C-REACTIVE PROTEIN: CRP: 7.1 mg/L (ref <8.0)

## 2021-11-04 LAB — SEDIMENTATION RATE: Sed Rate: 17 mm/h (ref 0–20)

## 2021-11-04 LAB — RHEUMATOID FACTOR: Rheumatoid fact SerPl-aCnc: <14 IU/mL (ref <14)

## 2021-11-08 NOTE — Telephone Encounter (Signed)
I attempted to call pt again, no answer, left vm.

## 2021-11-09 ENCOUNTER — Other Ambulatory Visit (HOSPITAL_COMMUNITY): Payer: Self-pay

## 2021-11-12 NOTE — Telephone Encounter (Signed)
Final attempt to reach pt, no answer, left vm.

## 2021-11-15 ENCOUNTER — Other Ambulatory Visit (HOSPITAL_COMMUNITY): Payer: Self-pay

## 2021-11-15 ENCOUNTER — Other Ambulatory Visit: Payer: Self-pay | Admitting: Family Medicine

## 2021-11-15 DIAGNOSIS — E782 Mixed hyperlipidemia: Secondary | ICD-10-CM

## 2021-11-15 MED FILL — Lisinopril Tab 20 MG: ORAL | 90 days supply | Qty: 45 | Fill #3 | Status: AC

## 2021-11-17 ENCOUNTER — Other Ambulatory Visit (HOSPITAL_COMMUNITY): Payer: Self-pay

## 2021-12-01 ENCOUNTER — Telehealth: Payer: Self-pay | Admitting: *Deleted

## 2021-12-01 NOTE — Telephone Encounter (Signed)
RETURNED PATIENT'S PHONE CALL, SPOKE WITH PATIENT. ?

## 2021-12-02 ENCOUNTER — Ambulatory Visit: Payer: PRIVATE HEALTH INSURANCE | Admitting: Radiation Oncology

## 2021-12-03 ENCOUNTER — Other Ambulatory Visit (HOSPITAL_COMMUNITY): Payer: Self-pay

## 2021-12-03 ENCOUNTER — Telehealth (HOSPITAL_COMMUNITY): Payer: Self-pay

## 2021-12-03 DIAGNOSIS — F319 Bipolar disorder, unspecified: Secondary | ICD-10-CM

## 2021-12-03 NOTE — Telephone Encounter (Signed)
Patient called and stated that she's lashing out badly at people and at work. She stated that she's angry and having frequent crying spells but doesn't know why. She wants to talk to you about it before her appointment with you in April. She wants advice on her meds to see if they need to be adjusted. She wants a call back at [phone]. Please review and advise. Thank you ?

## 2021-12-06 ENCOUNTER — Ambulatory Visit: Payer: PRIVATE HEALTH INSURANCE | Attending: Internal Medicine | Admitting: Internal Medicine

## 2021-12-06 ENCOUNTER — Other Ambulatory Visit (HOSPITAL_COMMUNITY): Payer: Self-pay

## 2021-12-06 ENCOUNTER — Encounter: Payer: Self-pay | Admitting: Internal Medicine

## 2021-12-06 ENCOUNTER — Encounter: Payer: Self-pay | Admitting: Hematology and Oncology

## 2021-12-06 VITALS — BP 120/78 | HR 99 | Resp 16 | Wt 192.8 lb

## 2021-12-06 DIAGNOSIS — L723 Sebaceous cyst: Secondary | ICD-10-CM

## 2021-12-06 DIAGNOSIS — E0590 Thyrotoxicosis, unspecified without thyrotoxic crisis or storm: Secondary | ICD-10-CM | POA: Diagnosis not present

## 2021-12-06 DIAGNOSIS — E782 Mixed hyperlipidemia: Secondary | ICD-10-CM

## 2021-12-06 DIAGNOSIS — I10 Essential (primary) hypertension: Secondary | ICD-10-CM | POA: Diagnosis not present

## 2021-12-06 DIAGNOSIS — F321 Major depressive disorder, single episode, moderate: Secondary | ICD-10-CM | POA: Diagnosis not present

## 2021-12-06 DIAGNOSIS — E669 Obesity, unspecified: Secondary | ICD-10-CM

## 2021-12-06 DIAGNOSIS — F411 Generalized anxiety disorder: Secondary | ICD-10-CM

## 2021-12-06 MED ORDER — ROSUVASTATIN CALCIUM 10 MG PO TABS
ORAL_TABLET | Freq: Every day | ORAL | 3 refills | Status: DC
  Filled 2021-12-06: qty 90, 90d supply, fill #0
  Filled 2022-03-02: qty 90, 90d supply, fill #1
  Filled 2022-06-06: qty 90, 90d supply, fill #2

## 2021-12-06 MED ORDER — OLANZAPINE 10 MG PO TABS
10.0000 mg | ORAL_TABLET | Freq: Every day | ORAL | 0 refills | Status: DC
  Filled 2021-12-06: qty 30, 30d supply, fill #0

## 2021-12-06 MED ORDER — METHIMAZOLE 10 MG PO TABS
10.0000 mg | ORAL_TABLET | Freq: Two times a day (BID) | ORAL | 4 refills | Status: DC
  Filled 2021-12-06 – 2021-12-31 (×2): qty 60, 30d supply, fill #0
  Filled 2022-01-24 – 2022-02-11 (×2): qty 60, 30d supply, fill #1
  Filled 2022-03-11: qty 60, 30d supply, fill #2
  Filled 2022-04-16: qty 60, 30d supply, fill #3

## 2021-12-06 MED ORDER — LISINOPRIL 20 MG PO TABS
ORAL_TABLET | Freq: Every day | ORAL | 3 refills | Status: DC
  Filled 2021-12-06: qty 90, fill #0
  Filled 2022-02-19: qty 45, 90d supply, fill #0
  Filled 2022-05-24: qty 45, 90d supply, fill #1

## 2021-12-06 MED ORDER — PROPRANOLOL HCL 10 MG PO TABS
10.0000 mg | ORAL_TABLET | Freq: Two times a day (BID) | ORAL | 1 refills | Status: DC
  Filled 2021-12-06 – 2022-02-11 (×3): qty 180, 90d supply, fill #0

## 2021-12-06 NOTE — Telephone Encounter (Signed)
I returned patient's phone call.  She is not sure what triggered but having a lot of irritability, crying, poor sleep and shouting and is snapping on people for no reason.  She had a visit to her PCP today and had blood work.  She has appointment in our office on April 11.  We talk about increasing the olanzapine dose.  In the past she has taken up to 20 mg of olanzapine but also concern of weight gain.  We have cut it down the olanzapine dose.  We have tried other medication but so far olanzapine had worked well.  I recommend try olanzapine 10 mg for now until she had appointment next week in our office.  We may consider switching to either REXULTI or Vraylar if 10 mg of olanzapine did not help her.  Discussed safety concerns and anytime having active suicidal thoughts or homicidal thought that she need to call 911 or go to local emergency room.  Patient agreed with the plan. ?

## 2021-12-06 NOTE — Progress Notes (Signed)
? ? ?Patient ID: Sandra Miller, female    DOB: [birth year]  MRN: [id number] ? ?CC: Hypertension and Cyst (On left cheek ) ? ? ?Subjective: ?Sandra Miller is a 46 y.o. female who presents for chronic ds management ?Her concerns today include:  ?Patient with history of HTN, HL, prediabetes, obesity, tob dep, RLS, DVT of the RT IJV and surrounding IJ port 04/2020 GERD, hidradenitis suppurativa, breast CA left dx 02/2020 (ERP s/p left mastectomy, neoadjuvant chemotherapy and adjuvant XRT; Dr. Lindi Adie).  Vulvar cancer (dx 01/2020), HSV infection, genitial warts, bipolar disorder/dep/anx (Dr. [name] Schilder) ?  ?Hyperthyroid: discovered to have hyperthyroid.  Taking meds as prescribed which are propranolol and Tapazole 10 mg twice a day..  Palpitations dec.  She has an appointment with [facility] endocrinology 01/2022. ?Gained 12 lbs since last visit.  Worried about wgh.  On Zyprexia.  Feels more healthy.  Does not feel she over eats but also feels she is not eating healthy.  Due to dec energy, not cooking as much.  Dinner is a take out most of the time.  Admits to consuming a lot of sugary drinks. ? ?Dep/Anx:  "I have been snapping at people." Did not go to work on Friday because she felt like she would snap.  Cries a lot.  Feels she has gone through a lot in past 2 yrs.  Still fearful that her cancer will come back.  Has telephone appt with Dr. [name] Schilder next wk.  Feels she needs a therapist who understands the cancer aspect.  [facility] connected her with a spiritual counselor but does not feel that is what she needs."I need someone who can help me understand the fear that cancer is coming back.  All I do is work and worry."   ?-feels tired all the time.  Started after chemo but now it is worse.  Works from 11 p.m to 7 a.m.  She lets her dog out to potty then goes to sleep.  Not motivated to get out of bed.  Will have grand-daughter next wk for Easter break.  Looks forward to keeping her grand-daughter as this will motivate her to get up and  going.  She has ordered a Building survey[employer] on [employer] recently.  Plans to start doing this to keep her mind occupied.  Also ordered a Bible. ? ?HTN:  compliant with med Lisinopril ?HL:  out of Crestor x 1wk ?C/o Cyst on face LT side x several wks. Wants derm referral.  Had similar one that was lanced.  ?Had f/u US through [facility] for RT IJ clot.  Resolved.  Xarelto d/c ?   ?Patient Active Problem List  ? Diagnosis Date Noted  ? Bilateral knee pain 11/03/2021  ? Hyperthyroidism 08/13/2021  ? Bipolar 1 disorder ([place]) 05/07/2021  ? Class 2 obesity in adult 05/07/2021  ? Hyperlipidemia 08/12/2020  ? Pre-diabetes 08/12/2020  ? Hypomagnesemia 08/12/2020  ? Mutation in [place] gene 06/24/2020  ? Hypokalemia 06/17/2020  ? Dehydration   ? Colitis 06/16/2020  ? Port-A-Cath in place 03/11/2020  ? Malignant neoplasm of upper-inner quadrant of left breast in female, estrogen receptor positive ([place]) 02/25/2020  ? Vulvar cancer ([place]) 01/29/2020  ? Vulvar ulceration 01/23/2020  ? Essential hypertension 10/31/2019  ? Nausea and vomiting 12/27/2018  ? Seasonal allergic rhinitis 12/27/2018  ? History of ELISA positive for HSV 10/01/2018  ? Restless leg syndrome 04/01/2016  ? Nephrolithiasis 03/08/2016  ? Obstructive pyelonephritis 03/08/2016  ? Normocytic anemia 03/08/2016  ? Hidradenitis suppurativa of left  axilla 11/03/2015  ? Loss of weight 09/08/2015  ? Neck pain on left side 10/22/2014  ? Genital warts 05/20/2014  ? GERD (gastroesophageal reflux disease) 12/26/2012  ? Functional dyspepsia 12/26/2012  ? Tobacco abuse 08/22/2012  ? Herpes simplex virus (HSV) infection 02/03/2007  ?  ? ?Current Outpatient Medications on File Prior to Visit  ?Medication Sig Dispense Refill  ? acetaminophen (TYLENOL) 500 MG tablet Take 2 tablets (1,000 mg total) by mouth every 6 (six) hours as needed for moderate pain or fever. Avoid tylenol until your liver function back to normal 30 tablet 0  ? benzonatate (TESSALON) 100 MG capsule Take 1 capsule (100 mg  total) by mouth 3 (three) times daily as needed. 30 capsule 0  ? Cholecalciferol (VITAMIN D3) 125 MCG (5000 UT) CAPS Take by mouth.    ? diclofenac Sodium (VOLTAREN) 1 % GEL Apply 4 grams topically 4  times daily as needed. 500 g 6  ? diphenoxylate-atropine (LOMOTIL) 2.5-0.025 MG tablet TAKE 1 TO 2 TABLETS BY MOUTH FOUR TIMES DAILY AS NEEDED FOR DIARRHEA 45 tablet 1  ? doxylamine, Sleep, (UNISOM) 25 MG tablet Take 25 mg by mouth at bedtime as needed for sleep.    ? gabapentin (NEURONTIN) 300 MG capsule Take 1 capsule (300 mg total) by mouth daily. 30 capsule 5  ? Glucosamine 750 MG TABS Take 750 mg by mouth in the morning and at bedtime.    ? ipratropium (ATROVENT) 0.03 % nasal spray Place 2 sprays into both nostrils every 12 (twelve) hours. 30 mL 0  ? letrozole (FEMARA) 2.5 MG tablet Take 1 tablet (2.5 mg total) by mouth daily. 90 tablet 3  ? letrozole (FEMARA) 2.5 MG tablet Take 1 tablet (2.5 mg total) by mouth daily. 90 tablet 3  ? loratadine (CLARITIN) 10 MG tablet Take 10 mg by mouth daily.    ? LORazepam (ATIVAN) 1 MG tablet Take 1 tablet by mouth 3 times daily as needed for anxiety. 90 tablet 2  ? magnesium oxide (MAG-OX) 400 (241.3 Mg) MG tablet Take 1 tablet (400 mg total) by mouth 2 (two) times daily. 60 tablet 1  ? Multiple Vitamin (MULTIVITAMIN WITH MINERALS) TABS tablet Take 1 tablet by mouth daily.    ? nicotine (NICODERM CQ) 21 mg/24hr patch Place 1 patch (21 mg total) onto the skin daily. 28 patch 0  ? OLANZapine (ZYPREXA) 10 MG tablet Take 1 tablet  by mouth at bedtime. 30 tablet 0  ? omeprazole (PRILOSEC) 40 MG capsule TAKE 1 CAPSULE BY MOUTH EVERY MORNING AND EVERY NIGHT AT BEDTIME 60 capsule 3  ? promethazine (PHENERGAN) 25 MG tablet TAKE 1 TABLET BY MOUTH EVERY 6 HOURS AS NEEDED FOR NAUSEA 30 tablet 6  ? rivaroxaban (XARELTO) 20 MG TABS tablet TAKE 1 TABLET BY MOUTH DAILY WITH SUPPER. 30 tablet 6  ? senna-docusate (SENOKOT-S) 8.6-50 MG tablet Take 2 tablets by mouth at bedtime. For AFTER surgery  only, do not take if having diarrhea 30 tablet 0  ? Turmeric 450 MG CAPS Take 450 mg by mouth in the morning and at bedtime.    ? venlafaxine XR (EFFEXOR-XR) 37.5 MG 24 hr capsule TAKE 3 CAPSULES BY MOUTH ONCE A DAY 90 capsule 2  ? [DISCONTINUED] prochlorperazine (COMPAZINE) 10 MG tablet TAKE 1 TABLET BY MOUTH EVERY 6 HOURS AS NEEDED FOR NAUSEA OR VOMITING (Patient taking differently: Take 10 mg by mouth every 6 (six) hours as needed for nausea or vomiting. ) 30 tablet 1  ? ?Current  Facility-Administered Medications on File Prior to Visit  ?Medication Dose Route Frequency Provider Last Rate Last Admin  ? LORazepam (ATIVAN) injection 0.5 mg  0.5 mg Intravenous Once Harle Stanford., PA-C      ? ? ?Allergies  ?Allergen Reactions  ? Dilaudid [Hydromorphone Hcl] Other (See Comments)  ?  Pt became confused, pulled out iv's, does not remember anything  ? Depakote [Divalproex Sodium] Nausea And Vomiting  ? Minocycline Hives  ? Aspirin Hives  ?  States able to tolerate Goody Powders without any problem except for GI upset  ? ? ?Social History  ? ?Socioeconomic History  ? Marital status: Single  ?  Spouse name: Not on file  ? Number of children: Not on file  ? Years of education: Not on file  ? Highest education level: Not on file  ?Occupational History  ? Not on file  ?Tobacco Use  ? Smoking status: Every Day  ?  Packs/day: 0.50  ?  Years: 22.00  ?  Pack years: 11.00  ?  Types: Cigarettes  ? Smokeless tobacco: Never  ? Tobacco comments:  ?  Recently started a smoking cessation class.   ?Vaping Use  ? Vaping Use: Never used  ?Substance and Sexual Activity  ? Alcohol use: Not Currently  ?  Alcohol/week: 0.0 standard drinks  ?  Comment:  hx alcohollism  in remission since 2014  ? Drug use: Yes  ?  Types: Marijuana  ?  Comment: occas.  ? Sexual activity: Yes  ?  Partners: Male  ?  Birth control/protection: Surgical  ?Other Topics Concern  ? Not on file  ?Social History Narrative  ? Former [facility] patient.  ?   ? Was on  disability at one point.  ? Return to the workforce.  40 hours a week at [employer], 10 hours a week on the weekends at [employer] in [place] at night 10 to 12 hours a week.  ?   ? Has

## 2021-12-06 NOTE — Patient Instructions (Signed)
Healthy Eating ?Following a healthy eating pattern may help you to achieve and maintain a healthy body weight, reduce the risk of chronic disease, and live a long and productive life. It is important to follow a healthy eating pattern at an appropriate calorie level for your body. Your nutritional needs should be met primarily through food by choosing a variety of nutrient-rich foods. ?What are tips for following this plan? ?Reading food labels ?Read labels and choose the following: ?Reduced or low sodium. ?Juices with 100% fruit juice. ?Foods with low saturated fats and high polyunsaturated and monounsaturated fats. ?Foods with whole grains, such as whole wheat, cracked wheat, brown rice, and wild rice. ?Whole grains that are fortified with folic acid. This is recommended for women who are pregnant or who want to become pregnant. ?Read labels and avoid the following: ?Foods with a lot of added sugars. These include foods that contain brown sugar, corn sweetener, corn syrup, dextrose, fructose, glucose, high-fructose corn syrup, honey, invert sugar, lactose, malt syrup, maltose, molasses, raw sugar, sucrose, trehalose, or turbinado sugar. ?Do not eat more than the following amounts of added sugar per day: ?6 teaspoons (25 g) for women. ?9 teaspoons (38 g) for men. ?Foods that contain processed or refined starches and grains. ?Refined grain products, such as white flour, degermed cornmeal, white bread, and white rice. ?Shopping ?Choose nutrient-rich snacks, such as vegetables, whole fruits, and nuts. Avoid high-calorie and high-sugar snacks, such as potato chips, fruit snacks, and candy. ?Use oil-based dressings and spreads on foods instead of solid fats such as butter, stick margarine, or cream cheese. ?Limit pre-made sauces, mixes, and "instant" products such as flavored rice, instant noodles, and ready-made pasta. ?Try more plant-protein sources, such as tofu, tempeh, black beans, edamame, lentils, nuts, and  seeds. ?Explore eating plans such as the Mediterranean diet or vegetarian diet. ?Cooking ?Use oil to saut? or stir-fry foods instead of solid fats such as butter, stick margarine, or lard. ?Try baking, boiling, grilling, or broiling instead of frying. ?Remove the fatty part of meats before cooking. ?Steam vegetables in water or broth. ?Meal planning ? ?At meals, imagine dividing your plate into fourths: ?One-half of your plate is fruits and vegetables. ?One-fourth of your plate is whole grains. ?One-fourth of your plate is protein, especially lean meats, poultry, eggs, tofu, beans, or nuts. ?Include low-fat dairy as part of your daily diet. ?Lifestyle ?Choose healthy options in all settings, including home, work, school, restaurants, or stores. ?Prepare your food safely: ?Wash your hands after handling raw meats. ?Keep food preparation surfaces clean by regularly washing with hot, soapy water. ?Keep raw meats separate from ready-to-eat foods, such as fruits and vegetables. ?Cook seafood, meat, poultry, and eggs to the recommended internal temperature. ?Store foods at safe temperatures. In general: ?Keep cold foods at 40?F (4.4?C) or below. ?Keep hot foods at 140?F (60?C) or above. ?Keep your freezer at 0?F (-17.8?C) or below. ?Foods are no longer safe to eat when they have been between the temperatures of 40?-140?F (4.4-60?C) for more than 2 hours. ?What foods should I eat? ?Fruits ?Aim to eat 2 cup-equivalents of fresh, canned (in natural juice), or frozen fruits each day. Examples of 1 cup-equivalent of fruit include 1 small apple, 8 large strawberries, 1 cup canned fruit, ? cup dried fruit, or 1 cup 100% juice. ?Vegetables ?Aim to eat 2?-3 cup-equivalents of fresh and frozen vegetables each day, including different varieties and colors. Examples of 1 cup-equivalent of vegetables include 2 medium carrots, 2 cups raw,  leafy greens, 1 cup chopped vegetable (raw or cooked), or 1 medium baked potato. ?Grains ?Aim to  eat 6 ounce-equivalents of whole grains each day. Examples of 1 ounce-equivalent of grains include 1 slice of bread, 1 cup ready-to-eat cereal, 3 cups popcorn, or ? cup cooked rice, pasta, or cereal. ?Meats and other proteins ?Aim to eat 5-6 ounce-equivalents of protein each day. Examples of 1 ounce-equivalent of protein include 1 egg, 1/2 cup nuts or seeds, or 1 tablespoon (16 g) peanut butter. A cut of meat or fish that is the size of a deck of cards is about 3-4 ounce-equivalents. ?Of the protein you eat each week, try to have at least 8 ounces come from seafood. This includes salmon, trout, herring, and anchovies. ?Dairy ?Aim to eat 3 cup-equivalents of fat-free or low-fat dairy each day. Examples of 1 cup-equivalent of dairy include 1 cup (240 mL) milk, 8 ounces (250 g) yogurt, 1? ounces (44 g) natural cheese, or 1 cup (240 mL) fortified soy milk. ?Fats and oils ?Aim for about 5 teaspoons (21 g) per day. Choose monounsaturated fats, such as canola and olive oils, avocados, peanut butter, and most nuts, or polyunsaturated fats, such as sunflower, corn, and soybean oils, walnuts, pine nuts, sesame seeds, sunflower seeds, and flaxseed. ?Beverages ?Aim for six 8-oz glasses of water per day. Limit coffee to three to five 8-oz cups per day. ?Limit caffeinated beverages that have added calories, such as soda and energy drinks. ?Limit alcohol intake to no more than 1 drink a day for nonpregnant women and 2 drinks a day for men. One drink equals 12 oz of beer (355 mL), 5 oz of wine (148 mL), or 1? oz of hard liquor (44 mL). ?Seasoning and other foods ?Avoid adding excess amounts of salt to your foods. Try flavoring foods with herbs and spices instead of salt. ?Avoid adding sugar to foods. ?Try using oil-based dressings, sauces, and spreads instead of solid fats. ?This information is based on general [place] nutrition guidelines. For more information, visit [employer]. Exact amounts may vary based on your nutrition  needs. ?Summary ?A healthy eating plan may help you to maintain a healthy weight, reduce the risk of chronic diseases, and stay active throughout your life. ?Plan your meals. Make sure you eat the right portions of a variety of nutrient-rich foods. ?Try baking, boiling, grilling, or broiling instead of frying. ?Choose healthy options in all settings, including home, work, school, restaurants, or stores. ?This information is not intended to replace advice given to you by your health care provider. Make sure you discuss any questions you have with your health care provider. ?Document Revised: 04/20/2021 Document Reviewed: 04/20/2021 ?Elsevier Patient Education ? [place]. ? ?

## 2021-12-07 ENCOUNTER — Encounter: Payer: Self-pay | Admitting: Internal Medicine

## 2021-12-07 LAB — TSH+T4F+T3FREE
Free T4: 1.01 ng/dL (ref 0.82–1.77)
T3, Free: 3.1 pg/mL (ref 2.0–4.4)
TSH: 0.146 u[IU]/mL — ABNORMAL LOW (ref 0.450–4.500)

## 2021-12-07 LAB — HEMOGLOBIN A1C
Est. average glucose Bld gHb Est-mCnc: 123 mg/dL
Hgb A1c MFr Bld: 5.9 % — ABNORMAL HIGH (ref 4.8–5.6)

## 2021-12-14 ENCOUNTER — Other Ambulatory Visit (HOSPITAL_COMMUNITY): Payer: Self-pay | Admitting: Psychiatry

## 2021-12-14 ENCOUNTER — Other Ambulatory Visit (HOSPITAL_COMMUNITY): Payer: Self-pay

## 2021-12-14 ENCOUNTER — Telehealth (HOSPITAL_BASED_OUTPATIENT_CLINIC_OR_DEPARTMENT_OTHER): Payer: PRIVATE HEALTH INSURANCE | Admitting: Psychiatry

## 2021-12-14 ENCOUNTER — Encounter (HOSPITAL_COMMUNITY): Payer: Self-pay | Admitting: Psychiatry

## 2021-12-14 VITALS — Wt 192.0 lb

## 2021-12-14 DIAGNOSIS — F319 Bipolar disorder, unspecified: Secondary | ICD-10-CM | POA: Diagnosis not present

## 2021-12-14 DIAGNOSIS — F411 Generalized anxiety disorder: Secondary | ICD-10-CM

## 2021-12-14 MED ORDER — OLANZAPINE 10 MG PO TABS
10.0000 mg | ORAL_TABLET | Freq: Every day | ORAL | 0 refills | Status: DC
Start: 2021-12-14 — End: 2022-01-12
  Filled 2021-12-14 – 2022-01-12 (×2): qty 30, 30d supply, fill #0

## 2021-12-14 MED ORDER — LORAZEPAM 1 MG PO TABS
1.0000 mg | ORAL_TABLET | Freq: Three times a day (TID) | ORAL | 2 refills | Status: DC | PRN
  Filled 2021-12-14: qty 90, 30d supply, fill #0
  Filled 2022-01-12: qty 90, 30d supply, fill #1

## 2021-12-14 MED ORDER — FLUOXETINE HCL 10 MG PO CAPS
ORAL_CAPSULE | ORAL | 0 refills | Status: DC
  Filled 2021-12-14: qty 60, 37d supply, fill #0

## 2021-12-14 MED ORDER — VENLAFAXINE HCL ER 37.5 MG PO CP24
ORAL_CAPSULE | ORAL | 0 refills | Status: DC
  Filled 2021-12-14: qty 21, 14d supply, fill #0

## 2021-12-14 NOTE — Progress Notes (Signed)
Virtual Visit via Telephone Note ? ?I connected with [place] on 12/14/21 at 10:40 AM EDT by telephone and verified that I am speaking with the correct person using two identifiers. ? ?Location: ?Patient: Home ?Provider: Home Office ?  ?I discussed the limitations, risks, security and privacy concerns of performing an evaluation and management service by telephone and the availability of in person appointments. I also discussed with the patient that there may be a patient responsible charge related to this service. The patient expressed understanding and agreed to proceed. ? ? ?History of Present Illness: ?Patient is evaluated by phone session.  She reported lately more irritable, angry and snapping out.  She is not sure what triggered her these symptoms.  She is seeing a endocrinologist for her Berenice Primas' disease and her appointment coming up in May.  Patient reported a lot of health issues and sometimes she feels depressed that and anxious.  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  She is not sure if the job stress causing worsening of symptoms but she wants to work to keep herself busy and keep the insurance.  Patient also told that recently she cut down her smoking and using patches.  She admitted weight gain.  We have increased olanzapine 10 mg recently to help her mood swing irritability and anger.  Patient reported it helped a little bit but is still for no reason she gets very upset.  She had a good support from her family.  She talks to her daughter every day and she had visited twice her son who lives now in [place].  She is taking venlafaxine, Ativan and olanzapine.  She also takes Inderal and gabapentin from other provider.  She sleeps on and off.  She denies any hallucination, paranoia.  She denies any mania, psychosis or any road rage. ?   ?Past Psychiatric History:  ?H/O bipolar disorder, alcohol dependence and marijuana abuse.  H/O at least 6 inpatient. Tried lithium, Seroquel,  Lamictal, Paxil, Celexa, Abilify, Geodon, Klonopin, Depakote, Neurontin and Cymbalta.  No H/o history of suicidal attempt.  ? ?Psychiatric Specialty Exam: ?Physical Exam  ?Review of Systems  ?Weight 192 lb (87.1 kg).There is no height or weight on file to calculate BMI.  ?General Appearance: NA  ?Eye Contact:  NA  ?Speech:  Slow  ?Volume:  Decreased  ?Mood:  Anxious, Depressed, Dysphoric, and Irritable  ?Affect:  NA  ?Thought Process:  Descriptions of Associations: Intact  ?Orientation:  Full (Time, Place, and Person)  ?Thought Content:  Rumination  ?Suicidal Thoughts:  No  ?Homicidal Thoughts:  No  ?Memory:  Immediate;   Good ?Recent;   Good ?Remote;   Good  ?Judgement:  Intact  ?Insight:  Present  ?Psychomotor Activity:  NA  ?Concentration:  Concentration: Good and Attention Span: Good  ?Recall:  Good  ?Fund of Knowledge:  Good  ?Language:  Good  ?Akathisia:  No  ?Handed:  Right  ?AIMS (if indicated):     ?Assets:  Communication Skills ?Desire for Improvement ?Housing ?Resilience ?Social Support ?Talents/Skills  ?ADL's:  Intact  ?Cognition:  WNL  ?Sleep:   fair  ? ? ? ? ?Assessment and Plan: ?Bipolar disorder type I.  Generalized anxiety disorder. ? ?I reviewed blood work results current medication.  Patient has multiple stressors mostly her health issues.  Working full-time and now she tried to cut down her smoking.  I explained that could be worsening of symptoms, weight gain and depression.  Patient like  to try Prozac which she had tried many years ago and did work very well but unclear why it was stopped.  She noticed marginal improvement with olanzapine 10 mg.  We talk about trying Wellbutrin which helps cigarette smoking but patient recall Prozac helped better in the past.  She does not want to take 2 antidepressant which I agree.  We will cut down her Effexor to take 75 mg for 1 week and then 37.5 mg for 1 week and then she will stop.  We will start Prozac 10 mg daily for 1 week and then 20 mg daily.  We  will continue for now olanzapine 10 mg at bedtime and Ativan 1 mg 3 times a day.  I recommended to call us back if she has any question or any concern.  We will follow up in 4 weeks. ? ?Follow Up Instructions: ? ?  ?I discussed the assessment and treatment plan with the patient. The patient was provided an opportunity to ask questions and all were answered. The patient agreed with the plan and demonstrated an understanding of the instructions. ?  ?The patient was advised to call back or seek an in-person evaluation if the symptoms worsen or if the condition fails to improve as anticipated. ? ?Collaboration of Care: Primary Care Provider AEB notes are available in epic to review ? ?Patient/Guardian was advised Release of Information must be obtained prior to any record release in order to collaborate their care with an outside provider. Patient/Guardian was advised if they have not already done so to contact the registration department to sign all necessary forms in order for [place] to release information regarding their care.  ? ?Consent: Patient/Guardian gives verbal consent for treatment and assignment of benefits for services provided during this visit. Patient/Guardian expressed understanding and agreed to proceed.   ? ?I provided 19 minutes of non-face-to-face time during this encounter. ? ? ?Kathlee Nations, MD  ?

## 2021-12-22 NOTE — Progress Notes (Signed)
?Radiation Oncology         ([phone]) [phone] ?________________________________ ? ?Name: Sandra Miller MRN: [id number]  ?Date: 12/23/2021  DOB: 01-28-1976 ? ?Follow-Up Visit Note ? ?CC: Ladell Pier, MD  Nicholas Lose, MD ? ?  ICD-10-CM   ?1. Malignant neoplasm of upper-inner quadrant of left breast in female, estrogen receptor positive ([place])  C50.212   ? Z17.0   ?  ?2. Vulvar cancer ([place])  C51.9 Ambulatory referral to Physical Therapy  ?  ? ? ?Diagnosis:  Stage IB (ypT2, ypN1a) Multicentric Left Breast, Invasive Ductal Carcinoma with DCIS, ER+ / PR+ / Her2+, Grade 3 ? ?Stage IB vulvar cancer s/p definitive radiation (inoperable due to the close proximity to the anal sphincter), s/p diagnosis of HR positive left breast cancer, and deleterious mutation in BRIP1 (increased risk for ovarian cancer).  ? ?Interval Since Last Radiation: 1 year, 2 months, and 2 days  ? ?2) Radiation Treatment Dates: 09/09/2020 through 10/23/2020 ?Site: Left chest wall ?Technique: 3D ?Total Dose (Gy): 50/50 ?Dose per Fx (Gy): 2 ?Completed Fx: 25/25 ?Beam Energies: 6X, 10X ?  ?Site: Left SCV ?Technique: 3D ?Total Dose (Gy): 50/50 ?Dose per Fx (Gy): 2 ?Completed Fx: 25/25 ?Beam Energies: 6X, 10X ?  ?Site: Left chest wall boost ?Technique: Electron ?Total Dose (Gy): 10/10 ?Dose per Fx (Gy): 2 ?Completed Fx: 5/5 ?Beam Energies: 6E ? ?1) Radiation Treatment Dates: 03/16/2020 through 05/08/2020 ?Site Technique Total Dose (Gy) Dose per Fx (Gy) Completed Fx Beam Energies  ?Vulva: Pelvis IMRT 45/45 1.8 25/25 6X  ?Vulva: Pelvis_Bst IMRT 16.2/16.2 1.8 9/9 6X  ? ?  ?Narrative:  The patient returns today for routine follow-up, she was last seen here for follow up on 07/01/21. Since her last visit, the patient continued on letrozole. During a follow up visit with Dr. Lindi Adie on 07/26/22 the patient was noted to not be doing well at all, reporting constant nausea, profound fatigue, significant weight loss, depression, and excruciating lower quadrant pain.  Given her significant symptoms including hot flashes, Dr. Lindi Adie held her letrozole a started the patient on Effexor Exar.  ? ?CT of the abdomen and pelvis on 08/06/21 showed no gross lesions or masses about the vulva, and no evidence of metastatic disease is the abdomen or pelvis. Other findings of potential clinical significance noted on CT included hepatic steatosis, a small umbilical hernia, and aortic atherosclerosis.  ? ?In the interval the patient also followed up with Dr. Berline Lopes on 09/30/21. During which time, the patient endorsed ongoing intermittent and mild vaginal discharge and intermittent left sided pelvic pain. The patient described her pelvic pain as sharp and localized to her deep pelvis and radiating up the side of her abdomen to to the level of her breast. She also noted that when she walks she often has to stop to bend over and wait for the pain to pass. She otherwise denied any vaginal bleeding, vulvar pruritus, pain, or any urinary symptoms, and was noted as NED one examination. For her pelvic pain, Dr. Berline Lopes advised her to start increasing her fluid and fiber intake. She was also encouraged to try simethicone. If her symptoms continue to persist, Dr. Berline Lopes recommended a referral to PT for pelvic floor therapy or a referral back to her gastroenterologist.   ? ?Per her most recent follow up visit with Dr. Lindi Adie on 10/28/21, the cause of the patient's severe side effects to letrozole were attributed to severe hyperthyroidism/Graves' disease. That being said, Dr. Lindi Adie advised holding letrozole for another  3 months (at least) to give her an adequate amount of time to gain some weight back/recover. She will continue on gabapentin and Effexor. The patient will also continue seeing behavioral health for her depression.  ? ?Pertinent imaging and studies performed in the interval since the patient was last seen includes:  ?-- UE vascular ultrasound on 11/04/21: showed no evidence of DVT in either  upper extremity.  ?-- Bilateral screening mammogram on 09/16/21 showed no evidence of malignancy in either breast (scattered areas of fibroglandular density were noted but were unchanged since her last mammogram).  ? 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 ? ?She has not started back using her vaginal dilator since her hysterectomy.  I recommended she go ahead and initiate this therapy. ? ?She continues to have some episodes of diarrhea alternating with constipation but this issue seems to be of less frequency. ? ?She denies any pain along the left chest area or problems with swelling in her left arm or hand. ? ?Allergies:  is allergic to dilaudid [hydromorphone hcl], depakote [divalproex sodium], minocycline, and aspirin. ? ?Meds: ?Current Outpatient Medications  ?Medication Sig Dispense Refill  ? acetaminophen (TYLENOL) 500 MG tablet Take 2 tablets (1,000 mg total) by mouth every 6 (six) hours as needed for moderate pain or fever. Avoid tylenol until your liver function back to normal 30 tablet 0  ? diclofenac Sodium (VOLTAREN) 1 % GEL Apply 4 grams topically 4  times daily as needed. 500 g 6  ? doxylamine, Sleep, (UNISOM) 25 MG tablet Take 25 mg by mouth at bedtime as needed for sleep.    ? fluconazole (DIFLUCAN) 100 MG tablet Take 1 tablet (100 mg total) by mouth daily. 7 tablet 0  ? FLUoxetine (PROZAC) 10 MG capsule Take one capsule by mouth daily for one week and then take two capsules daily 60 capsule 0  ? gabapentin (NEURONTIN) 300 MG capsule Take 1 capsule (300 mg total) by mouth daily. 30 capsule 5  ? letrozole (FEMARA) 2.5 MG tablet Take 1 tablet (2.5 mg total) by mouth daily. 90 tablet 3  ? letrozole (FEMARA) 2.5 MG tablet Take 1 tablet (2.5 mg total) by mouth daily. 90 tablet 3  ? lisinopril (ZESTRIL) 20 MG tablet TAKE 1/2 TABLET BY MOUTH ONCE A DAY 90 tablet 3  ? loratadine  (CLARITIN) 10 MG tablet Take 10 mg by mouth daily.    ? LORazepam (ATIVAN) 1 MG tablet Take 1 tablet by mouth 3 times daily as needed for anxiety. 90 tablet 2  ? methimazole (TAPAZOLE) 10 MG tablet Take 1 tablet (10 mg total) by mouth 2 (two) times daily. 60 tablet 4  ? Multiple Vitamin (MULTIVITAMIN WITH MINERALS) TABS tablet Take 1 tablet by mouth daily.    ? OLANZapine (ZYPREXA) 10 MG tablet Take 1 tablet  by mouth at bedtime. 30 tablet 0  ? omeprazole (PRILOSEC) 40 MG capsule TAKE 1 CAPSULE BY MOUTH EVERY MORNING AND EVERY NIGHT AT BEDTIME 60 capsule 3  ? promethazine (PHENERGAN) 25 MG tablet TAKE 1 TABLET BY MOUTH EVERY 6 HOURS AS NEEDED FOR NAUSEA 30 tablet 6  ? propranolol (INDERAL) 10 MG tablet Take 1 tablet by mouth 2 (two) times daily. 180 tablet 1  ? rosuvastatin (CRESTOR) 10 MG tablet TAKE 1 TABLET BY MOUTH ONCE A DAY 90 tablet 3  ? senna-docusate (  SENOKOT-S) 8.6-50 MG tablet Take 2 tablets by mouth at bedtime. For AFTER surgery only, do not take if having diarrhea 30 tablet 0  ? venlafaxine XR (EFFEXOR-XR) 37.5 MG 24 hr capsule Take two capsules by mouth daily for one week and then one capsule daily for week and then discontinue. 30 capsule 0  ? Cholecalciferol (VITAMIN D3) 125 MCG (5000 UT) CAPS Take by mouth. (Patient not taking: Reported on 12/23/2021)    ? diphenoxylate-atropine (LOMOTIL) 2.5-0.025 MG tablet TAKE 1 TO 2 TABLETS BY MOUTH FOUR TIMES DAILY AS NEEDED FOR DIARRHEA (Patient not taking: Reported on 12/23/2021) 45 tablet 1  ? Glucosamine 750 MG TABS Take 750 mg by mouth in the morning and at bedtime. (Patient not taking: Reported on 12/23/2021)    ? ipratropium (ATROVENT) 0.03 % nasal spray Place 2 sprays into both nostrils every 12 (twelve) hours. 30 mL 0  ? magnesium oxide (MAG-OX) 400 (241.3 Mg) MG tablet Take 1 tablet (400 mg total) by mouth 2 (two) times daily. (Patient not taking: Reported on 12/23/2021) 60 tablet 1  ? nicotine (NICODERM CQ) 21 mg/24hr patch Place 1 patch (21 mg total)  onto the skin daily. (Patient not taking: Reported on 12/23/2021) 28 patch 0  ? rivaroxaban (XARELTO) 20 MG TABS tablet TAKE 1 TABLET BY MOUTH DAILY WITH SUPPER. (Patient not taking: Reported on 12/23/2021) 30 t

## 2021-12-23 ENCOUNTER — Encounter: Payer: Self-pay | Admitting: Radiation Oncology

## 2021-12-23 ENCOUNTER — Other Ambulatory Visit: Payer: Self-pay

## 2021-12-23 ENCOUNTER — Ambulatory Visit
Admission: RE | Admit: 2021-12-23 | Discharge: 2021-12-23 | Disposition: A | Discharge status: Home / Self Care | Payer: PRIVATE HEALTH INSURANCE | Source: Ambulatory Visit | Attending: Radiation Oncology | Admitting: Radiation Oncology

## 2021-12-23 DIAGNOSIS — Z170 Estrogen receptor positive status [ER+]: Secondary | ICD-10-CM | POA: Insufficient documentation

## 2021-12-23 DIAGNOSIS — Z79899 Other long term (current) drug therapy: Secondary | ICD-10-CM | POA: Diagnosis not present

## 2021-12-23 DIAGNOSIS — Z7901 Long term (current) use of anticoagulants: Secondary | ICD-10-CM | POA: Diagnosis not present

## 2021-12-23 DIAGNOSIS — Z923 Personal history of irradiation: Secondary | ICD-10-CM | POA: Insufficient documentation

## 2021-12-23 DIAGNOSIS — C50212 Malignant neoplasm of upper-inner quadrant of left female breast: Secondary | ICD-10-CM | POA: Diagnosis present

## 2021-12-23 DIAGNOSIS — Z79811 Long term (current) use of aromatase inhibitors: Secondary | ICD-10-CM | POA: Insufficient documentation

## 2021-12-23 DIAGNOSIS — Z8589 Personal history of malignant neoplasm of other organs and systems: Secondary | ICD-10-CM | POA: Insufficient documentation

## 2021-12-23 DIAGNOSIS — R232 Flushing: Secondary | ICD-10-CM | POA: Insufficient documentation

## 2021-12-23 DIAGNOSIS — E0500 Thyrotoxicosis with diffuse goiter without thyrotoxic crisis or storm: Secondary | ICD-10-CM | POA: Diagnosis not present

## 2021-12-23 DIAGNOSIS — C519 Malignant neoplasm of vulva, unspecified: Secondary | ICD-10-CM

## 2021-12-23 HISTORY — DX: Disorder of thyroid, unspecified: E07.9

## 2021-12-23 MED ORDER — FLUCONAZOLE 100 MG PO TABS
100.0000 mg | ORAL_TABLET | Freq: Every day | ORAL | 0 refills | Status: DC
  Filled 2021-12-23: qty 7, 7d supply, fill #0

## 2021-12-23 NOTE — Progress Notes (Signed)
Sandra Miller is here today for follow up post radiation to the breast and to the pelvic.  ? ? Breast Side:Left chest wall ? ? ?They completed their radiation on: 10/23/20 ? ?Does the patient complain of any of the following: ?Post radiation skin issues: No ?Breast Tenderness: no ?Breast Swelling: no ?Lymphadema: no ?Range of Motion limitations: no ?Fatigue post radiation: Yes continues to have a low energy level.  ?Appetite good/fair/poor: Good ? ? ? She completed their radiation to her pelvis on: 05/08/20 ? ?Does the patient complain of any of the following: ? ?Pain: Yes, patient reports having intermittent pelvic pain.  ?Abdominal bloating: No ?Diarrhea/Constipation: Both constipation and diarrhea at times.  ?Nausea/Vomiting: Nausea ?Vaginal Discharge: No ?Blood in Urine or Stool: No ?Urinary Issues (dysuria/incomplete emptying/ incontinence/ increased frequency/urgency): Yes, urinary urgency.  Reports having some episodes of incontinence of bladder and bowel.  ?Does patient report using vaginal dilator 2-3 times a week and/or sexually active 2-3 weeks:  Patient has not yet started back using dilator after hysterectomy. Patient reports having some difficulty when she tried to use the dilator. Will give patient a smaller dilator.  ?Post radiation skin changes: No ? ? ?Additional comments if applicable: Patient reports being recently diagnosed with hyperthyroidism.  ?  ?Vitals:  ? 12/23/21 0802  ?BP: 133/84  ?Pulse: 99  ?Resp: 18  ?Temp: (!) 96.3 ?F (35.7 ?C)  ?TempSrc: Temporal  ?SpO2: 99%  ?Weight: 202 lb 4 oz (91.7 kg)  ?Height: 5' 5.5" (1.664 m)  ?  ?

## 2021-12-24 ENCOUNTER — Other Ambulatory Visit (HOSPITAL_COMMUNITY): Payer: Self-pay

## 2021-12-31 ENCOUNTER — Other Ambulatory Visit (HOSPITAL_COMMUNITY): Payer: Self-pay

## 2022-01-02 ENCOUNTER — Ambulatory Visit (HOSPITAL_COMMUNITY)
Admission: RE | Admit: 2022-01-02 | Discharge: 2022-01-02 | Disposition: A | Discharge status: Home / Self Care | Payer: PRIVATE HEALTH INSURANCE | Source: Ambulatory Visit | Attending: Internal Medicine | Admitting: Internal Medicine

## 2022-01-02 ENCOUNTER — Encounter (HOSPITAL_COMMUNITY): Payer: Self-pay

## 2022-01-02 VITALS — BP 137/93 | HR 74 | Temp 98.4°F | Resp 17 | Ht 65.5 in | Wt 202.2 lb

## 2022-01-02 DIAGNOSIS — L989 Disorder of the skin and subcutaneous tissue, unspecified: Secondary | ICD-10-CM

## 2022-01-02 MED ORDER — ACETAMINOPHEN 500 MG PO TABS: 500.0000 mg | ORAL_TABLET | Freq: Four times a day (QID) | ORAL | 0 refills | Status: DC | PRN

## 2022-01-02 MED ORDER — AMOXICILLIN-POT CLAVULANATE 875-125 MG PO TABS: 1.0000 | ORAL_TABLET | Freq: Two times a day (BID) | ORAL | 0 refills | Status: DC

## 2022-01-02 NOTE — ED Provider Notes (Signed)
[facility]    CSN: [id number] Arrival date & time: 01/02/22  1015      History   Chief Complaint Chief Complaint  Patient presents with   Facial Pain    I have a boil on left side of face that is very painful. I need it cut and drained. - Entered by patient    HPI Sandra Miller is a 46 y.o. female.   Patient presents with cyst to her left face that has progressively grown over the last 2 months. She has an appointment with her dermatologist in June, but the cyst has become more painful to where she cannot sleep on the left side of her face and she feels the pain constantly.Dermatologist recently cut one off from her eye before. She periodically gets these cysts and has to have a provider take care of them. Today, she presents with a cyst to the left side of her jaw that is approximately 1cm in diameter in size. She reports 7 on a scale of 0-10 pain to this cyst and it is further irritated by wearing a mask. She doesn't remember if she has ever had a cyst in this area before. She has tried warm compresses to reduce the pain and she says that "sometimes it'll bring it to a head."  She denies redness, swelling, and pus drainage at this time.  She has not taken any medications for her pain at home, but states that she has stomach problems and cannot take NSAID medications.    Past Medical History:  Diagnosis Date   Arthritis    Bipolar 1 disorder (HCC)    Cancer (HCC)    Complication of anesthesia    wakes up during procedures   Depression    GAD (generalized anxiety disorder)    Genital HSV    currently per pt  no break out 03-22-2016    GERD (gastroesophageal reflux disease)    Hiatal hernia    History of cervical dysplasia    2012 laser ablation   History of esophageal dilatation    for dysphasia -- x2 dilated   History of gastric ulcer    History of Helicobacter pylori infection    remote hx   History of hidradenitis suppurativa    "gets all over body  intermittantly"     History of hypertension    no issue since stopped drinking alcohol 2014   History of kidney stones    History of panic attacks    History of radiation therapy 03/16/2020-05/08/2020   vulva  Dr Antony Blackbird   History of radiation therapy 09/09/2020-10/23/2020   left chest wall/left SCV   Dr Antony Blackbird   Hypertension    Iron deficiency anemia    Left ureteral stone    OCD (obsessive compulsive disorder)    PONV (postoperative nausea and vomiting)    Pre-diabetes    PTSD (post-traumatic stress disorder)    Recovering alcoholic in remission (HCC)    since 2014   RLS (restless legs syndrome)    Smokers' cough (HCC)    Thyroid disease    Urgency of urination    Yeast infection involving the vagina and surrounding area    secondary to taking antibiotic    Patient Active Problem List   Diagnosis Date Noted   Sebaceous cyst 12/06/2021   Bilateral knee pain 11/03/2021   Hyperthyroidism 08/13/2021   Bipolar 1 disorder (HCC) 05/07/2021   Obesity (BMI 30.0-34.9) 05/07/2021   Hyperlipidemia 08/12/2020  Prediabetes 08/12/2020   Hypomagnesemia 08/12/2020   Mutation in BRIP1 gene 06/24/2020   Hypokalemia 06/17/2020   Dehydration    Colitis 06/16/2020   Port-A-Cath in place 03/11/2020   Malignant neoplasm of upper-inner quadrant of left breast in female, estrogen receptor positive (HCC) 02/25/2020   Vulvar cancer (HCC) 01/29/2020   Vulvar ulceration 01/23/2020   Essential hypertension 10/31/2019   Nausea and vomiting 12/27/2018   Seasonal allergic rhinitis 12/27/2018   History of ELISA positive for HSV 10/01/2018   Restless leg syndrome 04/01/2016   Nephrolithiasis 03/08/2016   Obstructive pyelonephritis 03/08/2016   Normocytic anemia 03/08/2016   Hidradenitis suppurativa of left axilla 11/03/2015   Loss of weight 09/08/2015   Neck pain on left side 10/22/2014   Genital warts 05/20/2014   GERD (gastroesophageal reflux disease) 12/26/2012   Functional dyspepsia  12/26/2012   Tobacco abuse 08/22/2012   Herpes simplex virus (HSV) infection 02/03/2007    Past Surgical History:  Procedure Laterality Date   CESAREAN SECTION  1995   w/  Bilateral Tubal Ligation   COLONOSCOPY  last one 08-09-2013   CYSTOSCOPY W/ URETERAL STENT PLACEMENT Left 03/29/2016   Procedure: CYSTOSCOPY WITH STENT REPLACEMENT;  Surgeon: Hildred Laser, MD;  Location: [facility];  Service: Urology;  Laterality: Left;   CYSTOSCOPY WITH RETROGRADE PYELOGRAM, URETEROSCOPY AND STENT PLACEMENT Left 03/08/2016   Procedure: CYSTOSCOPY WITH  LEFT RETROGRADE PYELOGRAM, AND STENT PLACEMENT;  Surgeon: Hildred Laser, MD;  Location: [facility];  Service: Urology;  Laterality: Left;   CYSTOSCOPY/RETROGRADE/URETEROSCOPY/STONE EXTRACTION WITH BASKET Left 03/29/2016   Procedure: CYSTOSCOPY/RETROGRADE/URETEROSCOPY/STONE EXTRACTION WITH BASKET;  Surgeon: Hildred Laser, MD;  Location[facility];  Service: Urology;  Laterality: Left;   ENDOMETRIAL ABLATION W/ NOVASURE  04-01-2010   ESOPHAGOGASTRODUODENOSCOPY  last one 08-09-2013   KNEE ARTHROSCOPY Left as teen   LASER ABLATION OF THE CERVIX  2012 approx   MASTECTOMY WITH AXILLARY LYMPH NODE DISSECTION Left 07/27/2020   Procedure: LEFT MASTECTOMY WITH LEFT RADIOACTIVE SEED GUIDED TARGETED AXILLARY LYMPH NODE DISSECTION;  Surgeon: Ovidio Kin, MD;  Location: [facility];  Service: General;  Laterality: Left;   PORT-A-CATH REMOVAL Right 05/18/2021   Procedure: REMOVAL PORT-A-CATH;  Surgeon: Manus Rudd, MD;  Location: [facility];  Service: General;  Laterality: Right;   PORTACATH PLACEMENT Right 03/06/2020   Procedure: INSERTION PORT-A-CATH WITH ULTRASOUND GUIDANCE;  Surgeon: Ovidio Kin, MD;  Location: [facility];  Service: General;  Laterality: Right;   ROBOTIC ASSISTED TOTAL HYSTERECTOMY WITH BILATERAL SALPINGO OOPHERECTOMY N/A 05/18/2021   Procedure: XI ROBOTIC ASSISTED TOTAL HYSTERECTOMY WITH BILATERAL  SALPINGO OOPHORECTOMY;  Surgeon: Adolphus Birchwood, MD;  Location: [facility];  Service: Gynecology;  Laterality: N/A;   TRANSTHORACIC ECHOCARDIOGRAM  05-19-2006   lvsf normal, ef 55-65%, there was mild flattening of the interventricular septum during diastoli/  RV size at upper limits normal   TUBAL LIGATION     VULVA /PERINEUM BIOPSY N/A 05/18/2021   Procedure: VULVAR BIOPSY;  Surgeon: Adolphus Birchwood, MD;  Location: [facility];  Service: Gynecology;  Laterality: N/A;   WISDOM TOOTH EXTRACTION  age 29 's    OB History     Gravida  2   Para  2   Term  2   Preterm      AB      Living  2      SAB      IAB      Ectopic      Multiple  Live Births  2            Home Medications    Prior to Admission medications   Medication Sig Start Date End Date Taking? Authorizing Provider  acetaminophen (TYLENOL) 500 MG tablet Take 1 tablet (500 mg total) by mouth every 6 (six) hours as needed. 01/02/22  Yes Carlisle Beers, FNP  amoxicillin-clavulanate (AUGMENTIN) 875-125 MG tablet Take 1 tablet by mouth every 12 (twelve) hours. 01/02/22  Yes Carlisle Beers, FNP  Cholecalciferol (VITAMIN D3) 125 MCG (5000 UT) CAPS Take by mouth. Patient not taking: Reported on 12/23/2021    [provider]  diclofenac Sodium (VOLTAREN) 1 % GEL Apply 4 grams topically 4  times daily as needed. 03/30/21   Hilts, Casimiro Needle, MD  diphenoxylate-atropine (LOMOTIL) 2.5-0.025 MG tablet TAKE 1 TO 2 TABLETS BY MOUTH FOUR TIMES DAILY AS NEEDED FOR DIARRHEA Patient not taking: Reported on 12/23/2021 05/26/20   Serena Croissant, MD  doxylamine, Sleep, (UNISOM) 25 MG tablet Take 25 mg by mouth at bedtime as needed for sleep.    [provider]  fluconazole (DIFLUCAN) 100 MG tablet Take 1 tablet by mouth daily. 12/23/21   Antony Blackbird, MD  FLUoxetine (PROZAC) 10 MG capsule Take one capsule by mouth daily for one week and then take two capsules daily 12/14/21   Arfeen, Phillips Grout, MD  gabapentin (NEURONTIN) 300  MG capsule Take 1 capsule (300 mg total) by mouth daily. 06/10/21   Meredith Pel, NP  Glucosamine 750 MG TABS Take 750 mg by mouth in the morning and at bedtime. Patient not taking: Reported on 12/23/2021    [provider]  ipratropium (ATROVENT) 0.03 % nasal spray Place 2 sprays into both nostrils every 12 (twelve) hours. 10/01/21   Margaretann Loveless, PA-C  letrozole [facility]) 2.5 MG tablet Take 1 tablet (2.5 mg total) by mouth daily. 10/28/21   Serena Croissant, MD  letrozole [facility]) 2.5 MG tablet Take 1 tablet (2.5 mg total) by mouth daily. 10/29/21   Serena Croissant, MD  lisinopril (ZESTRIL) 20 MG tablet TAKE 1/2 TABLET BY MOUTH ONCE A DAY 12/06/21 12/06/22  Marcine Matar, MD  loratadine (CLARITIN) 10 MG tablet Take 10 mg by mouth daily.    [provider]  LORazepam (ATIVAN) 1 MG tablet Take 1 tablet by mouth 3 times daily as needed for anxiety. 12/14/21 06/12/22  Arfeen, Phillips Grout, MD  magnesium oxide (MAG-OX) 400 (241.3 Mg) MG tablet Take 1 tablet (400 mg total) by mouth 2 (two) times daily. Patient not taking: Reported on 12/23/2021 08/12/20   Just, Azalee Course, FNP  methimazole (TAPAZOLE) 10 MG tablet Take 1 tablet (10 mg total) by mouth 2 (two) times daily. 12/06/21   Marcine Matar, MD  Multiple Vitamin (MULTIVITAMIN WITH MINERALS) TABS tablet Take 1 tablet by mouth daily.    [provider]  nicotine (NICODERM CQ) 21 mg/24hr patch Place 1 patch (21 mg total) onto the skin daily. Patient not taking: Reported on 12/23/2021 04/06/21   Serena Croissant, MD  OLANZapine (ZYPREXA) 10 MG tablet Take 1 tablet  by mouth at bedtime. 12/14/21 12/14/22  Arfeen, Phillips Grout, MD  omeprazole (PRILOSEC) 40 MG capsule TAKE 1 CAPSULE BY MOUTH EVERY MORNING AND EVERY NIGHT AT BEDTIME 10/11/21 10/11/22  Meredith Pel, NP  promethazine (PHENERGAN) 25 MG tablet TAKE 1 TABLET BY MOUTH EVERY 6 HOURS AS NEEDED FOR NAUSEA 07/26/21   Serena Croissant, MD  propranolol (INDERAL) 10 MG tablet Take 1  tablet by  mouth 2 (two) times daily. 12/06/21   Marcine Matar, MD  rivaroxaban (XARELTO) 20 MG TABS tablet TAKE 1 TABLET BY MOUTH DAILY WITH SUPPER. Patient not taking: Reported on 12/23/2021 06/10/21 06/10/22  Serena Croissant, MD  rosuvastatin (CRESTOR) 10 MG tablet TAKE 1 TABLET BY MOUTH ONCE A DAY 12/06/21 12/06/22  Marcine Matar, MD  senna-docusate (SENOKOT-S) 8.6-50 MG tablet Take 2 tablets by mouth at bedtime. For AFTER surgery only, do not take if having diarrhea 02/19/21   Warner Mccreedy D, NP  Turmeric 450 MG CAPS Take 450 mg by mouth in the morning and at bedtime. Patient not taking: Reported on 12/23/2021    [provider]  venlafaxine XR (EFFEXOR-XR) 37.5 MG 24 hr capsule Take two capsules by mouth daily for one week and then one capsule daily for week and then discontinue. 12/14/21   Arfeen, Phillips Grout, MD  prochlorperazine (COMPAZINE) 10 MG tablet TAKE 1 TABLET BY MOUTH EVERY 6 HOURS AS NEEDED FOR NAUSEA OR VOMITING Patient taking differently: Take 10 mg by mouth every 6 (six) hours as needed for nausea or vomiting.  06/16/20 08/18/20  Serena Croissant, MD    Family History Family History  Problem Relation Age of Onset   Heart disease Father    Lung cancer Father        d. 63   Alcohol abuse Father    Heart disease Mother    Depression Mother    Anxiety disorder Mother    Drug abuse Brother    Alcohol abuse Brother    Drug abuse Brother    ADD / ADHD Brother    Colon polyps Brother    Cancer Paternal Grandfather        "stomach"   Diabetes Maternal Grandfather    Diabetes Paternal Grandmother    Kidney disease Maternal Uncle    Cirrhosis Cousin        alcoholic   Anxiety disorder Maternal Aunt    Depression Maternal Aunt    Cancer Cousin        maternal; ovarian cancer or other "female" cancer?   Lung cancer Paternal Uncle 60   Throat cancer Cousin        paternal; dx 110s   Lung cancer Cousin        paternal; dx 51s    Social History Social History   Tobacco Use    Smoking status: Every Day    Packs/day: 0.50    Years: 22.00    Pack years: 11.00    Types: Cigarettes   Smokeless tobacco: Never   Tobacco comments:    Recently started a smoking cessation class.   Vaping Use   Vaping Use: Never used  Substance Use Topics   Alcohol use: Not Currently    Alcohol/week: 0.0 standard drinks    Comment:  hx alcohollism  in remission since 2014   Drug use: Yes    Types: Marijuana    Comment: occas.     Allergies   Dilaudid [hydromorphone hcl], Depakote [divalproex sodium], Minocycline, and Aspirin   Review of Systems Review of Systems Per HPI  Physical Exam Triage Vital Signs ED Triage Vitals  Enc Vitals Group     BP 01/02/22 1047 (!) 137/93     Pulse Rate 01/02/22 1047 74     Resp 01/02/22 1047 17     Temp 01/02/22 1047 98.4 F (36.9 C)     Temp Source 01/02/22 1047 Oral  SpO2 01/02/22 1047 96 %     Weight 01/02/22 1045 202 lb 4 oz (91.7 kg)     Height 01/02/22 1045 5' 5.5" (1.664 m)     Head Circumference --      Peak Flow --      Pain Score 01/02/22 1045 8     Pain Loc --      Pain Edu? --      Excl. in GC? --    No data found.  Updated Vital Signs BP (!) 137/93 (BP Location: Right Arm)   Pulse 74   Temp 98.4 F (36.9 C) (Oral)   Resp 17   Ht 5' 5.5" (1.664 m)   Wt 202 lb 4 oz (91.7 kg)   SpO2 96%   BMI 33.14 kg/m   Visual Acuity Right Eye Distance:   Left Eye Distance:   Bilateral Distance:    Right Eye Near:   Left Eye Near:    Bilateral Near:     Physical Exam Vitals and nursing note reviewed.  Constitutional:      General: She is not in acute distress.    Appearance: Normal appearance. She is well-developed. She is not ill-appearing.  HENT:     Head: Normocephalic and atraumatic.     Right Ear: Tympanic membrane, ear canal and external ear normal.     Left Ear: Tympanic membrane, ear canal and external ear normal.     Nose: Nose normal.     Mouth/Throat:     Mouth: Mucous membranes are moist.   Eyes:     Extraocular Movements: Extraocular movements intact.     Conjunctiva/sclera: Conjunctivae normal.  Cardiovascular:     Rate and Rhythm: Normal rate and regular rhythm.     Heart sounds: Normal heart sounds. No murmur heard.   No friction rub. No gallop.  Pulmonary:     Effort: Pulmonary effort is normal. No respiratory distress.     Breath sounds: Normal breath sounds. No wheezing, rhonchi or rales.  Chest:     Chest wall: No tenderness.  Abdominal:     Palpations: Abdomen is soft.     Tenderness: There is no abdominal tenderness. There is no right CVA tenderness or left CVA tenderness.  Musculoskeletal:        General: No swelling.     Cervical back: Neck supple.  Skin:    General: Skin is warm and dry.     Capillary Refill: Capillary refill takes less than 2 seconds.     Findings: No rash.     Comments: 1 cm diameter cyst to patient's left lower face near her jawline that erupted 2 months ago.  Patient is objectively nontender to palpation of area.  No redness, swelling, pus appreciated to physical exam.  Lesion is mildly fluctuant, but feels like an encapsulated pocket of tissue and skin debris.  Palpated lesion from the inside of patient's mouth and lesion is movable.  Patient does not have any oral mucosa changes or airway swelling.  No cervical lymphadenopathy present.  Sensation intact and patient able to move her face normally.  Neurological:     General: No focal deficit present.     Mental Status: She is alert and oriented to person, place, and time.  Psychiatric:        Mood and Affect: Mood normal.        Behavior: Behavior normal.        Thought Content: Thought content normal.  Judgment: Judgment normal.     UC Treatments / Results  Labs (all labs ordered are listed, but only abnormal results are displayed) Labs Reviewed - No data to display  EKG   Radiology No results found.  Procedures Procedures (including critical care  time)  Medications Ordered in UC Medications - No data to display  Initial Impression / Assessment and Plan / UC Course  I have reviewed the triage vital signs and the nursing notes.  Pertinent labs & imaging results that were available during my care of the patient were reviewed by me and considered in my medical decision making (see chart for details).  Patient presents with 26-month history of cyst to her left jawline that recently became painful over the last week or 2.  She works at night and notices the pain most when she is awake, but this pain makes it so that she cannot sleep on her left side of her face at night.  Her pain is currently a 7 out of 10 and she has not taken any medication for this at home.  Facial movement is not decreased by pain and her sensation to her face is intact.  I suspect that this is an encapsulated cyst that should be drained by dermatology or plastics.  I doubt that if I were to lance this today but any debris would come out.  Plan to treat with Augmentin for 7 days due to possible infection within cyst causing patient's significant increase in pain.  Patient also instructed to continue warm compresses and take extra strength Tylenol every 6 hours for pain.  She has a dermatology appointment in June to which she is encouraged to go.  If her symptoms become worse or if she develops any new significant swelling to her face, pain, redness surrounding the cyst, or purulent drainage, she is to return.  ED return precautions given.  Counseled patient regarding appropriate use of medications and potential side effects for all medications recommended or prescribed today. Discussed red flag signs and symptoms of worsening condition,when to call the PCP office, return to urgent care, and when to seek higher level of care. Patient verbalizes understanding and agreement with plan. All questions answered. Patient discharged in stable condition.   Final Clinical Impressions(s) / UC  Diagnoses   Final diagnoses:  Dermatologic problem     Discharge Instructions      Please take the antibiotic as prescribed for 7 days with food.  I believe that this will help your pain as there could be an infection brewing in the encapsulated cyst on your face.  Please take Tylenol every 6 hours as needed for your pain and continue to use warm compresses.  If you develop any new significant facial swelling, redness, or purulent drainage from your wound, please return.  If your symptoms are severe, please go to the emergency room.  I encourage you to attend your appointment in June with your dermatologist.  You may also call them and get on a cancellation list to be seen sooner possibly.  I hope it feels better!     ED Prescriptions     Medication Sig Dispense Auth. Provider   amoxicillin-clavulanate (AUGMENTIN) 875-125 MG tablet Take 1 tablet by mouth every 12 (twelve) hours. 14 tablet Reita May M, FNP   acetaminophen (TYLENOL) 500 MG tablet Take 1 tablet (500 mg total) by mouth every 6 (six) hours as needed. 30 tablet Carlisle Beers, FNP      PDMP not  reviewed this encounter.   Carlisle Beers, [place] 01/02/22 1225

## 2022-01-02 NOTE — ED Triage Notes (Signed)
Pt reports a cyst on the left side her face for a couple months. States the pain has gotten progressively worse. Also states has an appointment with Dermatologist but appointment isn't until June.  ?

## 2022-01-02 NOTE — Discharge Instructions (Addendum)
Please take the antibiotic as prescribed for 7 days with food.  I believe that this will help your pain as there could be an infection brewing in the encapsulated cyst on your face.  Please take Tylenol every 6 hours as needed for your pain and continue to use warm compresses. ? ?If you develop any new significant facial swelling, redness, or purulent drainage from your wound, please return.  If your symptoms are severe, please go to the emergency room.  I encourage you to attend your appointment in June with your dermatologist.  You may also call them and get on a cancellation list to be seen sooner possibly.  I hope it feels better! ?

## 2022-01-12 ENCOUNTER — Other Ambulatory Visit (HOSPITAL_COMMUNITY): Payer: Self-pay

## 2022-01-12 ENCOUNTER — Telehealth (HOSPITAL_BASED_OUTPATIENT_CLINIC_OR_DEPARTMENT_OTHER): Payer: PRIVATE HEALTH INSURANCE | Admitting: Psychiatry

## 2022-01-12 ENCOUNTER — Encounter (HOSPITAL_COMMUNITY): Payer: Self-pay | Admitting: Psychiatry

## 2022-01-12 ENCOUNTER — Other Ambulatory Visit: Payer: Self-pay | Admitting: Nurse Practitioner

## 2022-01-12 DIAGNOSIS — F411 Generalized anxiety disorder: Secondary | ICD-10-CM

## 2022-01-12 DIAGNOSIS — F319 Bipolar disorder, unspecified: Secondary | ICD-10-CM

## 2022-01-12 MED ORDER — OLANZAPINE 15 MG PO TABS
15.0000 mg | ORAL_TABLET | Freq: Every day | ORAL | 0 refills | Status: DC
  Filled 2022-01-12: qty 30, 30d supply, fill #0

## 2022-01-12 MED ORDER — GABAPENTIN 300 MG PO CAPS
300.0000 mg | ORAL_CAPSULE | Freq: Every day | ORAL | 5 refills | Status: DC
Start: 2022-01-12 — End: 2022-07-26
  Filled 2022-01-12: qty 30, 30d supply, fill #0
  Filled 2022-02-11: qty 30, 30d supply, fill #1
  Filled 2022-03-11: qty 30, 30d supply, fill #2
  Filled 2022-04-16: qty 30, 30d supply, fill #3
  Filled 2022-05-21: qty 30, 30d supply, fill #4
  Filled 2022-06-17: qty 30, 30d supply, fill #5

## 2022-01-12 MED ORDER — FLUOXETINE HCL 10 MG PO CAPS
ORAL_CAPSULE | ORAL | 0 refills | Status: DC
  Filled 2022-01-12: qty 90, 30d supply, fill #0

## 2022-01-12 NOTE — Progress Notes (Signed)
Virtual Visit via Telephone Note ? ?I connected with Sandra Miller on 01/12/22 at  4:20 PM EDT by telephone and verified that I am speaking with the correct person using two identifiers. ? ?Location: ?Patient: Home ?Provider: Home Office ?  ?I discussed the limitations, risks, security and privacy concerns of performing an evaluation and management service by telephone and the availability of in person appointments. I also discussed with the patient that there may be a patient responsible charge related to this service. The patient expressed understanding and agreed to proceed. ? ? ?History of Present Illness: ?Patient is evaluated by phone session.  She reported a lot of anxiety, depression and hopelessness.  She does not feel medicine working because she is very irritable, having mood swing and not sleeping well.  She had missed 2 days from work.  Patient reported her dog is now dying who she had for 11 years.  She feels tired and unmotivated to do things.  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  She has multiple health issues and now she is seeing endocrinologist for her thyroid.  She is taking methimazole which is prescribed by endocrinologist.  She was able to lost weight but now she gained weight.  We have cut down her olanzapine and Effexor and now she is taking Prozac.  She does not see improvement and remained very irritable and depressed.  She denies any hallucination or any paranoia.  She had a good support from her family. ?    ?Past Psychiatric History:  ?H/O bipolar disorder, alcohol dependence and marijuana abuse.  H/O at least 6 inpatient. Tried lithium, Seroquel, Lamictal, Paxil, Celexa, Abilify, Geodon, Klonopin, Depakote, Neurontin and Cymbalta.  No H/o history of suicidal attempt.  ? ? ?Recent Results (from the past 2160 hour(s))  ?Sedimentation rate     Status: None  ? Collection Time: 11/03/21  9:37 AM  ?Result Value Ref Range  ?  Sed Rate 17 0 - 20 mm/h  ?C-reactive protein     Status: None  ? Collection Time: 11/03/21  9:37 AM  ?Result Value Ref Range  ? CRP 7.1 <8.0 mg/L  ?Rheumatoid Factor     Status: None  ? Collection Time: 11/03/21  9:37 AM  ?Result Value Ref Range  ? Rhuematoid fact SerPl-aCnc <14 <14 IU/mL  ?Synovial Fluid Analysis, Complete     Status: Abnormal  ? Collection Time: 11/03/21  9:37 AM  ?Result Value Ref Range  ? Site NOT GIVEN   ? Color, Synovial PALE YELLOW (A) STRAW/YELLOW  ? Appearance-Synovial CLEAR CLEAR/HAZY  ? WBC, Synovial 144 <150 cells/uL  ? Neutrophil, Synovial 10 0 - 24 %  ? Lymphocytes-Synovial Fld 48 0 - 74 %  ? Monocyte/Macrophage 39 0 - 69 %  ? Eosinophils-Synovial 0 0 - 2 %  ? Basophils, % 0 0 %  ? Synoviocytes, % 3 0 - 15 %  ? Crystals, Fluid  NONE SEEN /HPF  ?  Comment: No crystals found  ?[id number]     Status: Abnormal  ? Collection Time: 12/06/21  9:25 AM  ?Result Value Ref Range  ? TSH 0.146 (L) 0.450 - 4.500 uIU/mL  ? T3, Free 3.1 2.0 - 4.4 pg/mL  ? Free T4 1.01 0.82 - 1.77 ng/dL  ?Hemoglobin A1c     Status: Abnormal  ? Collection Time: 12/06/21  9:25 AM  ?Result Value Ref Range  ? Hgb A1c MFr  Bld 5.9 (H) 4.8 - 5.6 %  ?  Comment:          Prediabetes: 5.7 - 6.4 ?         Diabetes: >6.4 ?         Glycemic control for adults with diabetes: <7.0 ?  ? Est. average glucose Bld gHb Est-mCnc 123 mg/dL  ?  ? ?Psychiatric Specialty Exam: ?Physical Exam  ?Review of Systems  ?Weight 202 lb (91.6 kg).There is no height or weight on file to calculate BMI.  ?General Appearance: NA  ?Eye Contact:  NA  ?Speech:  Slow  ?Volume:  Decreased  ?Mood:  Depressed, Dysphoric, and Hopeless  ?Affect:  NA  ?Thought Process:  Descriptions of Associations: Intact  ?Orientation:  Full (Time, Place, and Person)  ?Thought Content:  Rumination  ?Suicidal Thoughts:  No  ?Homicidal Thoughts:  No  ?Memory:  Immediate;   Good ?Recent;   Good ?Remote;   Good  ?Judgement:  Intact  ?Insight:  Present  ?Psychomotor Activity:  NA   ?Concentration:  Concentration: Fair and Attention Span: Fair  ?Recall:  Good  ?Fund of Knowledge:  Good  ?Language:  Good  ?Akathisia:  No  ?Handed:  Right  ?AIMS (if indicated):     ?Assets:  Communication Skills ?Desire for Improvement ?Housing ?Social Support ?Transportation  ?ADL's:  Intact  ?Cognition:  WNL  ?Sleep:   fair  ? ? ? ? ?Assessment and Plan: ?Bipolar disorder type I.  Generalized anxiety disorder. ? ?I reviewed blood work results.  Her hemoglobin A1c is stable.  I offered group therapy but patient does not feel comfortable talking to a group of people.  She prefer individual counseling and now she is looking for a therapist.  I recommend she should go back on olanzapine 15 mg which was helping her mood irritability and anger and agitation.  She had tried Prozac in the past with good response.  I encourage tried taking Prozac 30 mg and if it did not work then we may consider switching her medication.  She is no longer taking Effexor.  Continue Ativan 1 mg 3 times a day.  Patient is concerned about weight gain with olanzapine however her weight remain unchanged and maybe a little higher than previous visit even though we have cut down the olanzapine.  If weight continues to get increased then we may consider switching the medication.  Follow-up in 4 to 6 weeks.  Recommended to call us back if she has any question or any concern.  We will provide names of the therapist.   ? ?Follow Up Instructions: ? ?  ?I discussed the assessment and treatment plan with the patient. The patient was provided an opportunity to ask questions and all were answered. The patient agreed with the plan and demonstrated an understanding of the instructions. ?  ?The patient was advised to call back or seek an in-person evaluation if the symptoms worsen or if the condition fails to improve as anticipated. ? ?Collaboration of Care: Psychiatrist AEB notes are available in the epic to review. ? ?Patient/Guardian was advised  Release of Information must be obtained prior to any record release in order to collaborate their care with an outside provider. Patient/Guardian was advised if they have not already done so to contact the registration department to sign all necessary forms in order for [place] to release information regarding their care.  ? ?Consent: Patient/Guardian gives verbal consent for treatment and assignment of benefits for services provided  during this visit. Patient/Guardian expressed understanding and agreed to proceed.   ? ?I provided 27 minutes of non-face-to-face time during this encounter. ? ? ?Kathlee Nations, MD  ?

## 2022-01-13 ENCOUNTER — Other Ambulatory Visit (HOSPITAL_COMMUNITY): Payer: Self-pay

## 2022-01-20 ENCOUNTER — Other Ambulatory Visit (HOSPITAL_COMMUNITY): Payer: Self-pay

## 2022-01-24 ENCOUNTER — Other Ambulatory Visit (HOSPITAL_COMMUNITY): Payer: Self-pay

## 2022-01-26 ENCOUNTER — Encounter: Payer: Self-pay | Admitting: Orthopaedic Surgery

## 2022-01-26 ENCOUNTER — Ambulatory Visit (INDEPENDENT_AMBULATORY_CARE_PROVIDER_SITE_OTHER): Payer: PRIVATE HEALTH INSURANCE | Admitting: Orthopaedic Surgery

## 2022-01-26 VITALS — Ht 65.5 in | Wt 210.0 lb

## 2022-01-26 DIAGNOSIS — M25562 Pain in left knee: Secondary | ICD-10-CM

## 2022-01-26 DIAGNOSIS — G8929 Other chronic pain: Secondary | ICD-10-CM | POA: Diagnosis not present

## 2022-01-26 MED ORDER — METHYLPREDNISOLONE ACETATE 40 MG/ML IJ SUSP
80.0000 mg | INTRAMUSCULAR | Status: AC | PRN
  Administered 2022-01-26: 80 mg via INTRA_ARTICULAR

## 2022-01-26 MED ORDER — LIDOCAINE HCL 1 % IJ SOLN
5.0000 mL | INTRAMUSCULAR | Status: AC | PRN
  Administered 2022-01-26: 5 mL

## 2022-01-26 MED ORDER — BUPIVACAINE HCL 0.25 % IJ SOLN
2.0000 mL | INTRAMUSCULAR | Status: AC | PRN
  Administered 2022-01-26: 2 mL via INTRA_ARTICULAR

## 2022-01-26 NOTE — Progress Notes (Signed)
Office Visit Note   Patient: Sandra Miller           Date of Birth: [birth year]           MRN: [id number] Visit Date: 01/26/2022              Requested by: Sandra Pier, MD Sandra Miller,  Sandra Miller [id number] PCP: Sandra Pier, MD   Assessment & Plan: Visit Diagnoses: Left knee effusion  Plan: Patient with history of left knee pain and effusion.  She was last aspirated 60 cc of fluid in March.  She said the fluid did fairly quickly return.  She has had an MRI which is shown partial-thickness defect of the femoral cartilage and partial-thickness cartilage defect of the patellofemoral cartilage.  She is requesting repeat aspiration as her knee feels tight.  Of note she is also had arthritis panel drawn which was all relatively normal.  We also sent the fluid off for evaluation from her last visit again no infection or other findings  Follow-Up Instructions: No follow-ups on file.   Orders:  No orders of the defined types were placed in this encounter.  No orders of the defined types were placed in this encounter.     Procedures: Large Joint Inj: L knee on 01/26/2022 10:41 AM Indications: pain and diagnostic evaluation Details: 25 G 1.5 in needle, superior approach  Arthrogram: No  Medications: 80 mg methylPREDNISolone acetate 40 MG/ML; 5 mL lidocaine 1 %; 2 mL bupivacaine 0.25 % Aspirate: 30 mL blood-tinged Outcome: tolerated well, no immediate complications  After obtaining verbal consent left knee was prepped with alcohol followed by Betadine over the medial superior patella border.  18-gauge aspirate needle was in inserted and approximately 30 cc of blood-tinged clear fluid was drawn off.  80 mg of Depo-Medrol was injected along with 2 cc of bupivacaine and 2 cc of lidocaine Procedure, treatment alternatives, risks and benefits explained, specific risks discussed. Consent was given by the patient.     Clinical Data: No additional  findings.   Subjective: Chief Complaint  Patient presents with   Left Knee - Pain  Patient presents today for continued left left knee. She saw Sandra Miller on 11/03/2021. She had labwork done at the same time. She states that it feels like her knee has fluid on it. She said that it has been popping and hurts. She does not take anything for pain.   HPI  Review of Systems  All other systems reviewed and are negative.   Objective: Vital Signs: There were no vitals taken for this visit.  Physical Exam Vitals reviewed.  Constitutional:      Appearance: Normal appearance.  Pulmonary:     Effort: Pulmonary effort is normal.  Neurological:     General: No focal deficit present.     Mental Status: She is alert and oriented to person, place, and time.    Ortho Exam Examination of her left knee she has no redness no cellulitis.  She does have moderate effusion.  She has tightness and generalized pain with range of motion compartments of the calf are soft and nontender Specialty Comments:  No specialty comments available.  Imaging: No results found.   PMFS History: Patient Active Problem List   Diagnosis Date Noted   Sebaceous cyst 12/06/2021   Bilateral knee pain 11/03/2021   Hyperthyroidism 08/13/2021   Bipolar 1 disorder (Sandra Miller) 05/07/2021   Obesity (BMI 30.0-34.9) 05/07/2021   Hyperlipidemia  08/12/2020   Prediabetes 08/12/2020   Hypomagnesemia 08/12/2020   Mutation in [place] gene 06/24/2020   Hypokalemia 06/17/2020   Dehydration    Colitis 06/16/2020   Port-A-Cath in place 03/11/2020   Malignant neoplasm of upper-inner quadrant of left breast in female, estrogen receptor positive (Sandra Miller) 02/25/2020   Vulvar cancer (Sandra Miller) 01/29/2020   Vulvar ulceration 01/23/2020   Essential hypertension 10/31/2019   Nausea and vomiting 12/27/2018   Seasonal allergic rhinitis 12/27/2018   History of ELISA positive for HSV 10/01/2018   Restless leg syndrome 04/01/2016   Nephrolithiasis  03/08/2016   Obstructive pyelonephritis 03/08/2016   Normocytic anemia 03/08/2016   Hidradenitis suppurativa of left axilla 11/03/2015   Loss of weight 09/08/2015   Neck pain on left side 10/22/2014   Genital warts 05/20/2014   GERD (gastroesophageal reflux disease) 12/26/2012   Functional dyspepsia 12/26/2012   Tobacco abuse 08/22/2012   Herpes simplex virus (HSV) infection 02/03/2007   Past Medical History:  Diagnosis Date   Arthritis    Bipolar 1 disorder ([place])    Cancer ([place])    Complication of anesthesia    wakes up during procedures   Depression    GAD (generalized anxiety disorder)    Genital HSV    currently per pt  no break out 03-22-2016    GERD (gastroesophageal reflux disease)    Hiatal hernia    History of cervical dysplasia    2012 laser ablation   History of esophageal dilatation    for dysphasia -- x2 dilated   History of gastric ulcer    History of Helicobacter pylori infection    remote hx   History of hidradenitis suppurativa    "gets all over body intermittantly"     History of hypertension    no issue since stopped drinking alcohol 2014   History of kidney stones    History of panic attacks    History of radiation therapy 03/16/2020-05/08/2020   vulva  Dr Sandra Miller   History of radiation therapy 09/09/2020-10/23/2020   left chest wall/left SCV   Dr Sandra Miller   Hypertension    Iron deficiency anemia    Left ureteral stone    OCD (obsessive compulsive disorder)    PONV (postoperative nausea and vomiting)    Pre-diabetes    PTSD (post-traumatic stress disorder)    Recovering alcoholic in remission ([place])    since 2014   RLS (restless legs syndrome)    Smokers' cough ([place])    Thyroid disease    Urgency of urination    Yeast infection involving the vagina and surrounding area    secondary to taking antibiotic    Family History  Problem Relation Age of Onset   Heart disease Father    Lung cancer Father        d. 17   Alcohol abuse Father     Heart disease Mother    Depression Mother    Anxiety disorder Mother    Drug abuse Brother    Alcohol abuse Brother    Drug abuse Brother    ADD / ADHD Brother    Colon polyps Brother    Cancer Paternal Grandfather        "stomach"   Diabetes Maternal Grandfather    Diabetes Paternal Grandmother    Kidney disease Maternal Uncle    Cirrhosis Cousin        alcoholic   Anxiety disorder Maternal Aunt    Depression Maternal Aunt    Cancer Cousin  maternal; ovarian cancer or other "female" cancer?   Lung cancer Paternal Uncle 18   Throat cancer Cousin        paternal; dx 68s   Lung cancer Cousin        paternal; dx 66s    Past Surgical History:  Procedure Laterality Date   CESAREAN SECTION  1995   w/  Bilateral Tubal Ligation   COLONOSCOPY  last one 08-09-2013   CYSTOSCOPY W/ URETERAL STENT PLACEMENT Left 03/29/2016   Procedure: CYSTOSCOPY WITH STENT REPLACEMENT;  Surgeon: Nickie Retort, MD;  Location: [facility];  Service: Urology;  Laterality: Left;   CYSTOSCOPY WITH RETROGRADE PYELOGRAM, URETEROSCOPY AND STENT PLACEMENT Left 03/08/2016   Procedure: CYSTOSCOPY WITH  LEFT RETROGRADE PYELOGRAM, AND STENT PLACEMENT;  Surgeon: Nickie Retort, MD;  Location: [facility];  Service: Urology;  Laterality: Left;   CYSTOSCOPY/RETROGRADE/URETEROSCOPY/STONE EXTRACTION WITH BASKET Left 03/29/2016   Procedure: CYSTOSCOPY/RETROGRADE/URETEROSCOPY/STONE EXTRACTION WITH BASKET;  Surgeon: Nickie Retort, MD;  Location: [facility];  Service: Urology;  Laterality: Left;   ENDOMETRIAL ABLATION W/ NOVASURE  04-01-2010   ESOPHAGOGASTRODUODENOSCOPY  last one 08-09-2013   KNEE ARTHROSCOPY Left as teen   LASER ABLATION OF THE CERVIX  2012 approx   MASTECTOMY WITH AXILLARY LYMPH NODE DISSECTION Left 07/27/2020   Procedure: LEFT MASTECTOMY WITH LEFT RADIOACTIVE SEED GUIDED TARGETED AXILLARY LYMPH NODE DISSECTION;  Surgeon: Alphonsa Overall, MD;  Location: [place];   Service: General;  Laterality: Left;   PORT-A-CATH REMOVAL Right 05/18/2021   Procedure: REMOVAL PORT-A-CATH;  Surgeon: Donnie Mesa, MD;  Location: [facility];  Service: General;  Laterality: Right;   PORTACATH PLACEMENT Right 03/06/2020   Procedure: INSERTION PORT-A-CATH WITH ULTRASOUND GUIDANCE;  Surgeon: Alphonsa Overall, MD;  Location: [place];  Service: General;  Laterality: Right;   ROBOTIC ASSISTED TOTAL HYSTERECTOMY WITH BILATERAL SALPINGO OOPHERECTOMY N/A 05/18/2021   Procedure: XI ROBOTIC ASSISTED TOTAL HYSTERECTOMY WITH BILATERAL SALPINGO OOPHORECTOMY;  Surgeon: Everitt Amber, MD;  Location: [facility];  Service: Gynecology;  Laterality: N/A;   TRANSTHORACIC ECHOCARDIOGRAM  05-19-2006   lvsf normal, ef 55-65%, there was mild flattening of the interventricular septum during diastoli/  RV size at upper limits normal   TUBAL LIGATION     VULVA /PERINEUM BIOPSY N/A 05/18/2021   Procedure: VULVAR BIOPSY;  Surgeon: Everitt Amber, MD;  Location: [facility];  Service: Gynecology;  Laterality: N/A;   WISDOM TOOTH EXTRACTION  age 77 's   Social History   Occupational History   Not on file  Tobacco Use   Smoking status: Every Day    Packs/day: 0.50    Years: 22.00    Pack years: 11.00    Types: Cigarettes   Smokeless tobacco: Never   Tobacco comments:    Recently started a smoking cessation class.   Vaping Use   Vaping Use: Never used  Substance and Sexual Activity   Alcohol use: Not Currently    Alcohol/week: 0.0 standard drinks    Comment:  hx alcohollism  in remission since 2014   Drug use: Yes    Types: Marijuana    Comment: occas.   Sexual activity: Yes    Partners: Male    Birth control/protection: Surgical

## 2022-02-03 ENCOUNTER — Other Ambulatory Visit (HOSPITAL_COMMUNITY): Payer: Self-pay

## 2022-02-08 ENCOUNTER — Telehealth: Payer: Self-pay | Admitting: *Deleted

## 2022-02-08 NOTE — Telephone Encounter (Signed)
Pt's appointment has been rescheduled, per provider request, to 03/22/22 @ 3:00pm

## 2022-02-08 NOTE — Telephone Encounter (Signed)
Attempted to reach the patient to reschedule the appt from 7/17 to 7/18; LMOM to call the office

## 2022-02-11 ENCOUNTER — Emergency Department (HOSPITAL_COMMUNITY)
Admission: EM | Admit: 2022-02-11 | Discharge: 2022-02-11 | Disposition: A | Discharge status: Home / Self Care | Payer: PRIVATE HEALTH INSURANCE | Attending: Emergency Medicine | Admitting: Emergency Medicine

## 2022-02-11 ENCOUNTER — Other Ambulatory Visit (HOSPITAL_COMMUNITY): Payer: Self-pay | Admitting: Psychiatry

## 2022-02-11 ENCOUNTER — Ambulatory Visit: Payer: Self-pay

## 2022-02-11 ENCOUNTER — Other Ambulatory Visit: Payer: Self-pay

## 2022-02-11 ENCOUNTER — Encounter (HOSPITAL_COMMUNITY): Payer: Self-pay

## 2022-02-11 ENCOUNTER — Other Ambulatory Visit (HOSPITAL_COMMUNITY): Payer: Self-pay

## 2022-02-11 DIAGNOSIS — Z853 Personal history of malignant neoplasm of breast: Secondary | ICD-10-CM | POA: Diagnosis not present

## 2022-02-11 DIAGNOSIS — R252 Cramp and spasm: Secondary | ICD-10-CM | POA: Diagnosis not present

## 2022-02-11 DIAGNOSIS — F319 Bipolar disorder, unspecified: Secondary | ICD-10-CM

## 2022-02-11 DIAGNOSIS — E876 Hypokalemia: Secondary | ICD-10-CM | POA: Insufficient documentation

## 2022-02-11 DIAGNOSIS — Z7901 Long term (current) use of anticoagulants: Secondary | ICD-10-CM | POA: Insufficient documentation

## 2022-02-11 DIAGNOSIS — K529 Noninfective gastroenteritis and colitis, unspecified: Secondary | ICD-10-CM | POA: Diagnosis not present

## 2022-02-11 DIAGNOSIS — R197 Diarrhea, unspecified: Secondary | ICD-10-CM | POA: Diagnosis present

## 2022-02-11 LAB — COMPREHENSIVE METABOLIC PANEL
ALT: 22 U/L (ref 0–44)
AST: 42 U/L — ABNORMAL HIGH (ref 15–41)
Albumin: 4.4 g/dL (ref 3.5–5.0)
Alkaline Phosphatase: 85 U/L (ref 38–126)
Anion gap: 15 (ref 5–15)
BUN: 16 mg/dL (ref 6–20)
CO2: 21 mmol/L — ABNORMAL LOW (ref 22–32)
Calcium: 9.7 mg/dL (ref 8.9–10.3)
Chloride: 100 mmol/L (ref 98–111)
Creatinine, Ser: 0.67 mg/dL (ref 0.44–1.00)
GFR, Estimated: >60 mL/min (ref >60)
Glucose, Bld: 103 mg/dL — ABNORMAL HIGH (ref 70–99)
Potassium: 3.4 mmol/L — ABNORMAL LOW (ref 3.5–5.1)
Sodium: 136 mmol/L (ref 135–145)
Total Bilirubin: 1.3 mg/dL — ABNORMAL HIGH (ref 0.3–1.2)
Total Protein: 8.3 g/dL — ABNORMAL HIGH (ref 6.5–8.1)

## 2022-02-11 LAB — CBC
HCT: 42.8 % (ref 36.0–46.0)
Hemoglobin: 14.3 g/dL (ref 12.0–15.0)
MCH: 29.8 pg (ref 26.0–34.0)
MCHC: 33.4 g/dL (ref 30.0–36.0)
MCV: 89.2 fL (ref 80.0–100.0)
Platelets: 272 10*3/uL (ref 150–400)
RBC: 4.8 MIL/uL (ref 3.87–5.11)
RDW: 14.4 % (ref 11.5–15.5)
WBC: 8.5 10*3/uL (ref 4.0–10.5)
nRBC: 0 % (ref 0.0–0.2)

## 2022-02-11 LAB — I-STAT BETA HCG BLOOD, ED (MC, WL, AP ONLY): I-stat hCG, quantitative: <5 m[IU]/mL (ref <5)

## 2022-02-11 LAB — LIPASE, BLOOD: Lipase: 26 U/L (ref 11–51)

## 2022-02-11 MED ORDER — POTASSIUM CHLORIDE CRYS ER 20 MEQ PO TBCR
40.0000 meq | EXTENDED_RELEASE_TABLET | Freq: Once | ORAL | Status: AC
  Administered 2022-02-11: 40 meq via ORAL
  Filled 2022-02-11: qty 2

## 2022-02-11 MED ORDER — ONDANSETRON 4 MG PO TBDP
4.0000 mg | ORAL_TABLET | Freq: Three times a day (TID) | ORAL | 0 refills | Status: DC | PRN
  Filled 2022-02-11: qty 10, 4d supply, fill #0

## 2022-02-11 MED ORDER — LACTATED RINGERS IV BOLUS
1000.0000 mL | Freq: Once | INTRAVENOUS | Status: AC
  Administered 2022-02-11: 1000 mL via INTRAVENOUS

## 2022-02-11 NOTE — ED Triage Notes (Addendum)
Pt arrived via EMS, n/v diarrhea since yesterday. Spoke to PCP today and told to come to ED.   4 mg zofran 400 cc NS

## 2022-02-11 NOTE — Telephone Encounter (Signed)
  Chief Complaint: Vomiting and diarrhea Symptoms: Also leg cramps and weakness Frequency: since 1pm yesterday Pertinent Negatives: Patient denies  Disposition: '[x]'$ ED /'[]'$ Urgent Care (no appt availability in office) / '[]'$ Appointment(In office/virtual)/ '[]'$  [place] [facility]/ '[]'$ Home Care/ '[]'$ Refused Recommended Disposition /'[]'$ [place] [facility]/ '[]'$  Follow-up with PCP Additional Notes: Pt recently completed Chemo 04/2021. 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 Pt states that she cannot keep anything down and has BM with coughing, sneezing and cannot control BM's, which are a clear liquid. Reason for Disposition  [1] SEVERE vomiting (e.g., 6 or more times/day) AND [2] present > 8 hours (Exception: patient sounds well, is drinking liquids, does not sound dehydrated, and vomiting has lasted less than 24 hours)  Answer Assessment - Initial Assessment Questions 1. VOMITING SEVERITY: "How many times have you vomited in the past 24 hours?"     - MILD:  1 - 2 times/day    - MODERATE: 3 - 5 times/day, decreased oral intake without significant weight loss or symptoms of dehydration    - SEVERE: 6 or more times/day, vomits everything or nearly everything, with significant weight loss, symptoms of dehydration      Severe 2. ONSET: "When did the vomiting begin?"      1pm yesterday afternoon 3. FLUIDS: "What fluids or food have you vomited up today?" "Have you been able to keep any fluids down?"     Nothing staying down 4. ABDOMINAL PAIN: "Are your having any abdominal pain?" If yes : "How bad is it and what does it feel like?" (e.g., crampy, dull, intermittent, constant)      no 5. DIARRHEA: "Is there any diarrhea?" If Yes, ask: "How many times today?"      yes 6. CONTACTS: "Is there anyone else in the family with the same symptoms?"      At work 7. CAUSE: "What do you think is causing your vomiting?"     unsure 8. HYDRATION STATUS: "Any signs of dehydration?" (e.g.,  dry mouth [not only dry lips], too weak to stand) "When did you last urinate?"     Dry mouth , weakness 9. OTHER SYMPTOMS: "Do you have any other symptoms?" (e.g., fever, headache, vertigo, vomiting blood or coffee grounds, recent head injury)     Leg cramps 10. PREGNANCY: "Is there any chance you are pregnant?" "When was your last menstrual period?"       na  Protocols used: Vomiting-A-AH

## 2022-02-11 NOTE — ED Provider Notes (Signed)
[facility] DEPT Provider Note   CSN: [id number] Arrival date & time: 02/11/22  1539     History  Chief Complaint  Patient presents with   Nausea    Sandra Miller is a 46 y.o. female.  HPI 46 year old female with a history of breast cancer that is in remission presents with vomiting, diarrhea and muscle pain.  She developed the symptoms starting at 1 PM yesterday.  Numerous episodes of vomiting and diarrhea.  Diarrhea has slowed down since earlier this morning but still having a lot of nausea and some vomiting.  She took a Phenergan but vomited this back up.  EMS gave her Zofran and she feels a lot better, still has a little residual nausea but is better.  No fevers and no abdominal pain.  However she has been having cramping type pain in her bilateral lower legs and thighs.  No blood in the stools or emesis.  Home Medications Prior to Admission medications   Medication Sig Start Date End Date Taking? Authorizing Provider  ondansetron (ZOFRAN-ODT) 4 MG disintegrating tablet Take 1 tablet (4 mg total) by mouth every 8 (eight) hours as needed for nausea or vomiting. 02/11/22  Yes Sherwood Gambler, MD  acetaminophen (TYLENOL) 500 MG tablet Take 1 tablet (500 mg total) by mouth every 6 (six) hours as needed. 01/02/22   Talbot Grumbling, FNP  amoxicillin-clavulanate (AUGMENTIN) 875-125 MG tablet Take 1 tablet by mouth every 12 (twelve) hours. 01/02/22   Talbot Grumbling, FNP  Cholecalciferol (VITAMIN D3) 125 MCG (5000 UT) CAPS Take by mouth.    [provider]  diclofenac Sodium (VOLTAREN) 1 % GEL Apply 4 grams topically 4  times daily as needed. 03/30/21   Hilts, Legrand Como, MD  diphenoxylate-atropine (LOMOTIL) 2.5-0.025 MG tablet TAKE 1 TO 2 TABLETS BY MOUTH FOUR TIMES DAILY AS NEEDED FOR DIARRHEA 05/26/20   Nicholas Lose, MD  doxylamine, Sleep, (UNISOM) 25 MG tablet Take 25 mg by mouth at bedtime as needed for sleep.    [provider]   fluconazole (DIFLUCAN) 100 MG tablet Take 1 tablet by mouth daily. 12/23/21   Gery Pray, MD  FLUoxetine (PROZAC) 10 MG capsule Take 3 capsules by mouth daily 01/12/22   Arfeen, Arlyce Harman, MD  gabapentin (NEURONTIN) 300 MG capsule Take 1 capsule (300 mg total) by mouth daily. 01/12/22   Willia Craze, NP  Glucosamine 750 MG TABS Take 750 mg by mouth in the morning and at bedtime.    [provider]  letrozole (FEMARA) 2.5 MG tablet Take 1 tablet (2.5 mg total) by mouth daily. 10/28/21   Nicholas Lose, MD  letrozole [facility]) 2.5 MG tablet Take 1 tablet (2.5 mg total) by mouth daily. 10/29/21   Nicholas Lose, MD  lisinopril (ZESTRIL) 20 MG tablet TAKE 1/2 TABLET BY MOUTH ONCE A DAY 12/06/21 12/06/22  Ladell Pier, MD  loratadine (CLARITIN) 10 MG tablet Take 10 mg by mouth daily.    [provider]  LORazepam (ATIVAN) 1 MG tablet Take 1 tablet by mouth 3 times daily as needed for anxiety. 12/14/21 06/12/22  Arfeen, Arlyce Harman, MD  magnesium oxide (MAG-OX) 400 (241.3 Mg) MG tablet Take 1 tablet (400 mg total) by mouth 2 (two) times daily. 08/12/20   Just, Laurita Quint, FNP  methimazole (TAPAZOLE) 10 MG tablet Take 1 tablet (10 mg total) by mouth 2 (two) times daily. 12/06/21   Ladell Pier, MD  Multiple Vitamin (MULTIVITAMIN WITH MINERALS) TABS tablet  Take 1 tablet by mouth daily.    [provider]  nicotine (NICODERM CQ) 21 mg/24hr patch Place 1 patch (21 mg total) onto the skin daily. 04/06/21   Nicholas Lose, MD  OLANZapine (ZYPREXA) 15 MG tablet Take 1 tablet (15 mg total) by mouth at bedtime. 01/12/22 01/12/23  Arfeen, Arlyce Harman, MD  omeprazole (PRILOSEC) 40 MG capsule TAKE 1 CAPSULE BY MOUTH EVERY MORNING AND EVERY NIGHT AT BEDTIME 10/11/21 10/11/22  Willia Craze, NP  promethazine (PHENERGAN) 25 MG tablet TAKE 1 TABLET BY MOUTH EVERY 6 HOURS AS NEEDED FOR NAUSEA 07/26/21   Nicholas Lose, MD  propranolol (INDERAL) 10 MG tablet Take 1 tablet by mouth 2 (two) times daily. 12/06/21    Ladell Pier, MD  rivaroxaban (XARELTO) 20 MG TABS tablet TAKE 1 TABLET BY MOUTH DAILY WITH SUPPER. 06/10/21 06/10/22  Nicholas Lose, MD  rosuvastatin (CRESTOR) 10 MG tablet TAKE 1 TABLET BY MOUTH ONCE A DAY 12/06/21 12/06/22  Ladell Pier, MD  senna-docusate (SENOKOT-S) 8.6-50 MG tablet Take 2 tablets by mouth at bedtime. For AFTER surgery only, do not take if having diarrhea 02/19/21   Joylene John D, NP  Turmeric 450 MG CAPS Take 450 mg by mouth in the morning and at bedtime.    [provider]  venlafaxine XR (EFFEXOR-XR) 37.5 MG 24 hr capsule Take two capsules by mouth daily for one week and then one capsule daily for week and then discontinue. 12/14/21   Arfeen, Arlyce Harman, MD  prochlorperazine (COMPAZINE) 10 MG tablet TAKE 1 TABLET BY MOUTH EVERY 6 HOURS AS NEEDED FOR NAUSEA OR VOMITING Patient taking differently: Take 10 mg by mouth every 6 (six) hours as needed for nausea or vomiting.  06/16/20 08/18/20  Nicholas Lose, MD      Allergies    Dilaudid [hydromorphone hcl], Depakote [divalproex sodium], Minocycline, and Aspirin    Review of Systems   Review of Systems  Gastrointestinal:  Positive for diarrhea, nausea and vomiting. Negative for abdominal pain and blood in stool.  Musculoskeletal:  Positive for myalgias.  Neurological:  Negative for dizziness.    Physical Exam Updated Vital Signs BP (!) 154/82   Pulse 79   Temp 98.7 F (37.1 C) (Oral)   Resp 18   Ht 5' 5.5" (1.664 m)   Wt 93 kg   SpO2 94%   BMI 33.59 kg/m  Physical Exam Vitals and nursing note reviewed.  Constitutional:      General: She is not in acute distress.    Appearance: She is well-developed. She is not ill-appearing or diaphoretic.  HENT:     Head: Normocephalic and atraumatic.  Cardiovascular:     Rate and Rhythm: Normal rate and regular rhythm.     Heart sounds: Normal heart sounds.  Pulmonary:     Effort: Pulmonary effort is normal.     Breath sounds: Normal breath sounds.   Abdominal:     Palpations: Abdomen is soft.     Tenderness: There is no abdominal tenderness.  Musculoskeletal:     Comments: No tenderness or swelling to the bilateral thighs or lower legs  Skin:    General: Skin is warm and dry.  Neurological:     Mental Status: She is alert.     ED Results / Procedures / Treatments   Labs (all labs ordered are listed, but only abnormal results are displayed) Labs Reviewed  COMPREHENSIVE METABOLIC PANEL - Abnormal; Notable for the following components:  Result Value   Potassium 3.4 (*)    CO2 21 (*)    Glucose, Bld 103 (*)    Total Protein 8.3 (*)    AST 42 (*)    Total Bilirubin 1.3 (*)    All other components within normal limits  C DIFFICILE QUICK SCREEN W PCR REFLEX    GASTROINTESTINAL PANEL BY PCR, STOOL (REPLACES STOOL CULTURE)  LIPASE, BLOOD  CBC  URINALYSIS, ROUTINE W REFLEX MICROSCOPIC  I-STAT BETA HCG BLOOD, ED ([facility], [facility], [facility] ONLY)    EKG None  Radiology No results found.  Procedures Procedures    Medications Ordered in ED Medications  lactated ringers bolus 1,000 mL (0 mLs Intravenous Stopped 02/11/22 1745)  potassium chloride SA (KLOR-CON M) CR tablet 40 mEq (40 mEq Oral Given 02/11/22 1824)    ED Course/ Medical Decision Making/ A&P                           Medical Decision Making Amount and/or Complexity of Data Reviewed External Data Reviewed: notes. Labs: ordered.  Risk Prescription drug management.   Patient likely has acute gastroenteritis.  Her abdominal exam is benign.  Lab work has been reviewed/interpreted and is pretty unremarkable besides mild hypokalemia and a mildly elevated bilirubin but it is just barely above normal.  Given no abdominal pain or other significant LFT abnormalities I do not think right upper quadrant ultrasound or CT is needed.  She feels better with fluids in the cramping has gone away.  Tolerated oral potassium.  Will discharge with Zofran as needed and have her follow-up  with PCP as needed.        Final Clinical Impression(s) / ED Diagnoses Final diagnoses:  Acute gastroenteritis    Rx / DC Orders ED Discharge Orders          Ordered    ondansetron (ZOFRAN-ODT) 4 MG disintegrating tablet  Every 8 hours PRN        02/11/22 1813              Sherwood Gambler, MD 02/11/22 2310

## 2022-02-11 NOTE — Discharge Instructions (Addendum)
If you develop new or worsening vomiting, intractable vomiting and cannot tolerate fluids, muscle cramps, fever, abdominal pain, or any other new/concerning symptoms then return to the ER for evaluation.

## 2022-02-11 NOTE — ED Provider Triage Note (Signed)
Emergency Medicine Provider Triage Evaluation Note  Sandra Miller , a 46 y.o. female  was evaluated in triage.  Pt complains of vomiting and diarrhea since yesterday afternoon. Breast cancer pt, currently in remission. Has had too numerous to count episodes of emesis and diarrhea. Able to keep down some gatorade earlier today. Tried phenergran this morning and threw it up, got zofran with EMS.   Review of Systems  Positive: Nausea, vomiting, diarrhea, chills, muscle cramps Negative: Fever, abdominal pain, urinary sx  Physical Exam  BP (!) 149/96 (BP Location: Right Arm)   Pulse (!) 103   Temp 98.7 F (37.1 C) (Oral)   Resp 18   Ht 5' 5.5" (1.664 m)   Wt 93 kg   SpO2 94%   BMI 33.59 kg/m  Gen:   Awake, no distress   Resp:  Normal effort  MSK:   Moves extremities without difficulty  Other:    Medical Decision Making  Medically screening exam initiated at 3:55 PM.  Appropriate orders placed.  Jeanette C Waldeck was informed that the remainder of the evaluation will be completed by another provider, this initial triage assessment does not replace that evaluation, and the importance of remaining in the ED until their evaluation is complete.  After my interview and exam of the patient I ordered appropriate labs/imaging for vomiting and diarrhea. This preliminary work up includes lab work. Patient made aware the rest of their evaluation will be performed by another provider once roomed.    Vanessia Bokhari T, PA-C 02/11/22 1557

## 2022-02-11 NOTE — ED Notes (Signed)
Pt prompted x2 to provide urine sample. Pt unable to at this time.

## 2022-02-11 NOTE — ED Notes (Addendum)
Urine cup provided. Pt unable to provide sample at this time.

## 2022-02-12 ENCOUNTER — Other Ambulatory Visit (HOSPITAL_COMMUNITY): Payer: Self-pay

## 2022-02-13 ENCOUNTER — Other Ambulatory Visit (HOSPITAL_COMMUNITY): Payer: Self-pay | Admitting: Psychiatry

## 2022-02-13 DIAGNOSIS — F411 Generalized anxiety disorder: Secondary | ICD-10-CM

## 2022-02-13 DIAGNOSIS — F319 Bipolar disorder, unspecified: Secondary | ICD-10-CM

## 2022-02-15 NOTE — Telephone Encounter (Signed)
Called and left vm  °

## 2022-02-16 ENCOUNTER — Other Ambulatory Visit (HOSPITAL_COMMUNITY): Payer: Self-pay | Admitting: Psychiatry

## 2022-02-16 DIAGNOSIS — F411 Generalized anxiety disorder: Secondary | ICD-10-CM

## 2022-02-16 DIAGNOSIS — F319 Bipolar disorder, unspecified: Secondary | ICD-10-CM

## 2022-02-17 ENCOUNTER — Other Ambulatory Visit: Payer: Self-pay | Admitting: Nurse Practitioner

## 2022-02-17 ENCOUNTER — Telehealth (HOSPITAL_COMMUNITY): Payer: Self-pay | Admitting: *Deleted

## 2022-02-17 ENCOUNTER — Other Ambulatory Visit (HOSPITAL_COMMUNITY): Payer: Self-pay

## 2022-02-17 MED ORDER — OMEPRAZOLE 40 MG PO CPDR
DELAYED_RELEASE_CAPSULE | ORAL | 3 refills | Status: DC
  Filled 2022-02-17: qty 60, 30d supply, fill #0
  Filled 2022-03-17: qty 60, 30d supply, fill #1
  Filled 2022-04-18: qty 60, 30d supply, fill #2
  Filled 2022-05-21: qty 60, 30d supply, fill #3

## 2022-02-17 NOTE — Telephone Encounter (Signed)
Pt called requesting refills on all meds as no refills were scripted on last visit on 01/12/22. Just the one month supply. Pt has an appointment on 04/13/22. Ok to refill enough all until next appointment?

## 2022-02-18 ENCOUNTER — Other Ambulatory Visit (HOSPITAL_COMMUNITY): Payer: Self-pay

## 2022-02-18 ENCOUNTER — Other Ambulatory Visit (HOSPITAL_COMMUNITY): Payer: Self-pay | Admitting: *Deleted

## 2022-02-18 DIAGNOSIS — F411 Generalized anxiety disorder: Secondary | ICD-10-CM

## 2022-02-18 DIAGNOSIS — F319 Bipolar disorder, unspecified: Secondary | ICD-10-CM

## 2022-02-18 MED ORDER — OLANZAPINE 15 MG PO TABS
15.0000 mg | ORAL_TABLET | Freq: Every day | ORAL | 0 refills | Status: DC
  Filled 2022-02-18: qty 30, 30d supply, fill #0

## 2022-02-18 MED ORDER — LORAZEPAM 1 MG PO TABS: 1.0000 mg | ORAL_TABLET | Freq: Three times a day (TID) | ORAL | 0 refills | Status: DC | PRN

## 2022-02-18 MED ORDER — FLUOXETINE HCL 10 MG PO CAPS
ORAL_CAPSULE | ORAL | 0 refills | Status: DC
  Filled 2022-02-18: qty 90, 30d supply, fill #0

## 2022-02-18 MED ORDER — LORAZEPAM 1 MG PO TABS
ORAL_TABLET | ORAL | 0 refills | Status: DC
  Filled 2022-02-18: qty 90, 30d supply, fill #0

## 2022-02-18 NOTE — Telephone Encounter (Signed)
She is supposed to follow up in 4 to 6 weeks.  For that reason meds given for 30 days. We did meds adjustment. You can provide another 30-day supply but should see sooner than her scheduled appointment.

## 2022-02-18 NOTE — Telephone Encounter (Signed)
Ok will advise

## 2022-02-19 ENCOUNTER — Other Ambulatory Visit (HOSPITAL_COMMUNITY): Payer: Self-pay

## 2022-02-21 ENCOUNTER — Other Ambulatory Visit (HOSPITAL_COMMUNITY): Payer: Self-pay

## 2022-02-21 ENCOUNTER — Telehealth (HOSPITAL_BASED_OUTPATIENT_CLINIC_OR_DEPARTMENT_OTHER): Payer: PRIVATE HEALTH INSURANCE | Admitting: Psychiatry

## 2022-02-21 ENCOUNTER — Encounter (HOSPITAL_COMMUNITY): Payer: Self-pay | Admitting: Psychiatry

## 2022-02-21 VITALS — Wt 205.0 lb

## 2022-02-21 DIAGNOSIS — F319 Bipolar disorder, unspecified: Secondary | ICD-10-CM

## 2022-02-21 DIAGNOSIS — F411 Generalized anxiety disorder: Secondary | ICD-10-CM | POA: Diagnosis not present

## 2022-02-21 MED ORDER — VORTIOXETINE HBR 10 MG PO TABS
10.0000 mg | ORAL_TABLET | Freq: Every day | ORAL | 0 refills | Status: DC
  Filled 2022-02-21: qty 30, 30d supply, fill #0

## 2022-02-21 MED ORDER — OLANZAPINE 15 MG PO TABS: 15.0000 mg | ORAL_TABLET | Freq: Every day | ORAL | 0 refills | Status: DC

## 2022-02-21 MED ORDER — OLANZAPINE 15 MG PO TABS
15.0000 mg | ORAL_TABLET | Freq: Every day | ORAL | 1 refills | Status: DC
  Filled 2022-02-21 – 2022-02-22 (×2): qty 30, 30d supply, fill #0
  Filled 2022-03-17: qty 30, 30d supply, fill #1

## 2022-02-21 MED ORDER — LORAZEPAM 1 MG PO TABS: ORAL_TABLET | ORAL | 0 refills | Status: DC

## 2022-02-21 MED ORDER — LORAZEPAM 1 MG PO TABS
ORAL_TABLET | ORAL | 1 refills | Status: DC
  Filled 2022-02-21: qty 90, fill #0
  Filled 2022-02-22: qty 90, 30d supply, fill #0
  Filled 2022-03-17: qty 90, 30d supply, fill #1

## 2022-02-21 MED ORDER — VORTIOXETINE HBR 10 MG PO TABS
10.0000 mg | ORAL_TABLET | Freq: Every day | ORAL | 1 refills | Status: DC
  Filled 2022-02-21: qty 30, 30d supply, fill #0

## 2022-02-21 NOTE — Progress Notes (Signed)
Virtual Visit via Video Note  I connected with [place] on 02/21/22 at  2:20 PM EDT by a video enabled telemedicine application and verified that I am speaking with the correct person using two identifiers.  Location: Patient: Home Provider: Home office   I discussed the limitations of evaluation and management by telemedicine and the availability of in person appointments. The patient expressed understanding and agreed to proceed.  History of Present Illness: Patient is evaluated by phone session.  On the last visit we started the Prozac since patient had good response earlier in her life.  However she noticed continued to have irritability and mood swings and highs and lows.  Now she has been given the diagnosis of Graves' disease and her medicines are adjusted.  She is sad about the diagnosis but trying to accept and like to move on.  We have increased olanzapine which helped her sleep but is still her anxiety irritability exist.  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  She like to keep herself busy.  She does have a support from her daughter and kids.  She has no tremors, shakes or any EPS.  She denies any suicidal thoughts or homicidal thoughts.  She endorses general anxiousness about her health as she has doctor's appointment and blood work.  Recently she had a visit to the emergency room for abdominal pain and her labs were drawn.  She has low potassium but her CBC is normal.  Her lipase were normal.  Her pregnancy test was normal.  She is no longer taking Effexor.  She is taking Ativan, olanzapine and Prozac now 30 mg.  Prozac was increased recently when she called that she is not doing very well.  Recent Results (from the past 2160 hour(s))  TSH+T4F+T3Free     Status: Abnormal   Collection Time: 12/06/21  9:25 AM  Result Value Ref Range   TSH 0.146 (L) 0.450 - 4.500 uIU/mL   T3, Free 3.1 2.0 - 4.4 pg/mL   Free T4 1.01 0.82 - 1.77 ng/dL  Hemoglobin A1c     Status: Abnormal   Collection  Time: 12/06/21  9:25 AM  Result Value Ref Range   Hgb A1c MFr Bld 5.9 (H) 4.8 - 5.6 %    Comment:          Prediabetes: 5.7 - 6.4          Diabetes: >6.4          Glycemic control for adults with diabetes: <7.0    Est. average glucose Bld gHb Est-mCnc 123 mg/dL  Lipase, blood     Status: None   Collection Time: 02/11/22  4:07 PM  Result Value Ref Range   Lipase 26 11 - 51 U/L    Comment: Performed at [facility], [place] [address], [place], [place] [address]  Comprehensive metabolic panel     Status: Abnormal   Collection Time: 02/11/22  4:07 PM  Result Value Ref Range   Sodium 136 135 - 145 mmol/L   Potassium 3.4 (L) 3.5 - 5.1 mmol/L   Chloride 100 98 - 111 mmol/L   CO2 21 (L) 22 - 32 mmol/L   Glucose, Bld 103 (H) 70 - 99 mg/dL    Comment: Glucose reference range applies only to samples taken after fasting for at least 8 hours.   BUN 16 6 - 20 mg/dL   Creatinine, Ser 0.67 0.44 - 1.00 mg/dL   Calcium 9.7 8.9 - 10.3  mg/dL   Total Protein 8.3 (H) 6.5 - 8.1 g/dL   Albumin 4.4 3.5 - 5.0 g/dL   AST 42 (H) 15 - 41 U/L   ALT 22 0 - 44 U/L   Alkaline Phosphatase 85 38 - 126 U/L   Total Bilirubin 1.3 (H) 0.3 - 1.2 mg/dL   GFR, Estimated >60 >60 mL/min    Comment: (NOTE) Calculated using the CKD-EPI Creatinine Equation (2021)    Anion gap 15 5 - 15    Comment: Performed at [facility], [place] [address] [place], [place] [address]  CBC     Status: None   Collection Time: 02/11/22  4:07 PM  Result Value Ref Range   WBC 8.5 4.0 - 10.5 K/uL   RBC 4.80 3.87 - 5.11 MIL/uL   Hemoglobin 14.3 12.0 - 15.0 g/dL   HCT 42.8 36.0 - 46.0 %   MCV 89.2 80.0 - 100.0 fL   MCH 29.8 26.0 - 34.0 pg   MCHC 33.4 30.0 - 36.0 g/dL   RDW 14.4 11.5 - 15.5 %   Platelets 272 150 - 400 K/uL   nRBC 0.0 0.0 - 0.2 %    Comment: Performed at [facility], [place] [address] [place], [place] [address]  I-Stat beta hCG blood, ED     Status: None   Collection  Time: 02/11/22  4:12 PM  Result Value Ref Range   I-stat hCG, quantitative <5.0 <5 mIU/mL   Comment 3            Comment:   GEST. AGE      CONC.  (mIU/mL)   <=1 WEEK        5 - 50     2 WEEKS       50 - 500     3 WEEKS       100 - 10,000     4 WEEKS     1,000 - 30,000        FEMALE AND NON-PREGNANT FEMALE:     LESS THAN 5 mIU/mL      Psychiatric Specialty Exam: Physical Exam  Review of Systems  Weight 205 lb (93 kg).There is no height or weight on file to calculate BMI.  General Appearance: NA  Eye Contact:  NA  Speech:  Slow  Volume:  Decreased  Mood:  Anxious, Depressed, and Irritable  Affect:  NA  Thought Process:  Descriptions of Associations: Intact  Orientation:  Full (Time, Place, and Person)  Thought Content:  Rumination  Suicidal Thoughts:  No  Homicidal Thoughts:  No  Memory:  Immediate;   Good Recent;   Good Remote;   Good  Judgement:  Fair  Insight:  Present  Psychomotor Activity:  NA  Concentration:  Concentration: Fair and Attention Span: Fair  Recall:  Good  Fund of Knowledge:  Good  Language:  Good  Akathisia:  No  Handed:  Right  AIMS (if indicated):     Assets:  Communication Skills Desire for Improvement Housing Social Support  ADL's:  Intact  Cognition:  WNL  Sleep:   too much      Assessment and Plan: Bipolar disorder type I.  Generalized anxiety disorder.  Patient do not feel any improvement with increase Prozac up to 30 mg.  She is sleeping better with the olanzapine.  I discussed try Trintellix which she has never tried before.  We will provide samples of 5 mg going up to 10 mg in 1  week and also provide prescription of Trintellix 10 mg to her pharmacy.  I will continue olanzapine 15 mg at bedtime and Ativan 1 mg 3 times a day.  We will follow up in 6 to 8 weeks to see the response.  I encouraged to call us back if she feels worsening of the symptoms.  Discussed safety concerns and anytime having active suicidal thoughts or homicidal  thought that she need to call 911 or go to local emergency room.  Follow Up Instructions:    I discussed the assessment and treatment plan with the patient. The patient was provided an opportunity to ask questions and all were answered. The patient agreed with the plan and demonstrated an understanding of the instructions.   The patient was advised to call back or seek an in-person evaluation if the symptoms worsen or if the condition fails to improve as anticipated.  Collaboration of Care: Primary Care Provider AEB notes are in epic to review.  Patient/Guardian was advised Release of Information must be obtained prior to any record release in order to collaborate their care with an outside provider. Patient/Guardian was advised if they have not already done so to contact the registration department to sign all necessary forms in order for [place] to release information regarding their care.   Consent: Patient/Guardian gives verbal consent for treatment and assignment of benefits for services provided during this visit. Patient/Guardian expressed understanding and agreed to proceed.    I provided 27 minutes of non-face-to-face time during this encounter.   Kathlee Nations, MD

## 2022-02-22 ENCOUNTER — Other Ambulatory Visit (HOSPITAL_COMMUNITY): Payer: Self-pay

## 2022-02-26 ENCOUNTER — Other Ambulatory Visit: Payer: Self-pay | Admitting: Nurse Practitioner

## 2022-02-28 ENCOUNTER — Ambulatory Visit: Payer: PRIVATE HEALTH INSURANCE | Admitting: Gynecologic Oncology

## 2022-03-01 ENCOUNTER — Other Ambulatory Visit (HOSPITAL_COMMUNITY): Payer: Self-pay

## 2022-03-02 ENCOUNTER — Other Ambulatory Visit (HOSPITAL_COMMUNITY): Payer: Self-pay

## 2022-03-10 ENCOUNTER — Other Ambulatory Visit: Payer: Self-pay | Admitting: Hematology and Oncology

## 2022-03-10 ENCOUNTER — Other Ambulatory Visit (HOSPITAL_COMMUNITY): Payer: Self-pay

## 2022-03-10 MED ORDER — NICOTINE 21 MG/24HR TD PT24
21.0000 mg | MEDICATED_PATCH | Freq: Every day | TRANSDERMAL | 0 refills | Status: DC
  Filled 2022-03-10: qty 28, 28d supply, fill #0

## 2022-03-11 ENCOUNTER — Other Ambulatory Visit (HOSPITAL_COMMUNITY): Payer: Self-pay

## 2022-03-12 ENCOUNTER — Other Ambulatory Visit (HOSPITAL_COMMUNITY): Payer: Self-pay

## 2022-03-17 ENCOUNTER — Telehealth (HOSPITAL_COMMUNITY): Payer: Self-pay | Admitting: Psychiatry

## 2022-03-17 DIAGNOSIS — F411 Generalized anxiety disorder: Secondary | ICD-10-CM

## 2022-03-17 DIAGNOSIS — F319 Bipolar disorder, unspecified: Secondary | ICD-10-CM

## 2022-03-18 ENCOUNTER — Other Ambulatory Visit (HOSPITAL_COMMUNITY): Payer: Self-pay

## 2022-03-21 ENCOUNTER — Other Ambulatory Visit (HOSPITAL_COMMUNITY): Payer: Self-pay

## 2022-03-21 ENCOUNTER — Ambulatory Visit: Payer: PRIVATE HEALTH INSURANCE | Admitting: Gynecologic Oncology

## 2022-03-22 ENCOUNTER — Inpatient Hospital Stay: Payer: PRIVATE HEALTH INSURANCE | Attending: Gynecologic Oncology | Admitting: Gynecologic Oncology

## 2022-03-22 ENCOUNTER — Other Ambulatory Visit: Payer: Self-pay

## 2022-03-22 VITALS — BP 125/78 | HR 84 | Temp 97.5°F | Resp 16 | Ht 65.0 in | Wt 193.0 lb

## 2022-03-22 DIAGNOSIS — Z1502 Genetic susceptibility to malignant neoplasm of ovary: Secondary | ICD-10-CM | POA: Diagnosis not present

## 2022-03-22 DIAGNOSIS — Z1501 Genetic susceptibility to malignant neoplasm of breast: Secondary | ICD-10-CM | POA: Insufficient documentation

## 2022-03-22 DIAGNOSIS — C519 Malignant neoplasm of vulva, unspecified: Secondary | ICD-10-CM

## 2022-03-22 DIAGNOSIS — Z90722 Acquired absence of ovaries, bilateral: Secondary | ICD-10-CM | POA: Diagnosis not present

## 2022-03-22 DIAGNOSIS — Z148 Genetic carrier of other disease: Secondary | ICD-10-CM | POA: Diagnosis not present

## 2022-03-22 DIAGNOSIS — F3289 Other specified depressive episodes: Secondary | ICD-10-CM

## 2022-03-22 DIAGNOSIS — Z1509 Genetic susceptibility to other malignant neoplasm: Secondary | ICD-10-CM | POA: Diagnosis not present

## 2022-03-22 DIAGNOSIS — Z8544 Personal history of malignant neoplasm of other female genital organs: Secondary | ICD-10-CM | POA: Diagnosis not present

## 2022-03-22 DIAGNOSIS — Z90710 Acquired absence of both cervix and uterus: Secondary | ICD-10-CM | POA: Insufficient documentation

## 2022-03-22 DIAGNOSIS — Z9221 Personal history of antineoplastic chemotherapy: Secondary | ICD-10-CM | POA: Insufficient documentation

## 2022-03-22 DIAGNOSIS — Z923 Personal history of irradiation: Secondary | ICD-10-CM | POA: Insufficient documentation

## 2022-03-22 NOTE — Patient Instructions (Addendum)
It was good to see you today.  I do not see or feel any evidence of cancer recurrence.  We will keep alternating visits every 3 months at least until the end of the year.  You see Dr. Sondra Come in October.  Please call if you have any new and concerning symptoms before your next follow-up with me.  I will plan to see you back in January.  Please call sometime in November or December to get that visit scheduled.

## 2022-03-22 NOTE — Progress Notes (Signed)
Gynecologic Oncology Return Clinic Visit  03/22/22  Reason for Visit: follow-up in the setting of vulvar cancer  Treatment History: Oncology History  Vulvar cancer (HCC)  01/29/2020 Initial Diagnosis   Vulvar cancer (HCC)   03/11/2020 - 05/04/2020 Chemotherapy   The patient had dexamethasone (DECADRON) 4 MG tablet, 4 mg (100 % of original dose 4 mg), Oral, Daily, 1 of 1 cycle, Start date: 02/27/2020, End date: 05/05/2020 Dose modification: 4 mg (original dose 4 mg, Cycle 0) palonosetron (ALOXI) injection 0.25 mg, 0.25 mg, Intravenous,  Once, 1 of 1 cycle Administration: 0.25 mg (03/11/2020), 0.25 mg (03/17/2020), 0.25 mg (03/24/2020), 0.25 mg (03/31/2020), 0.25 mg (04/07/2020), 0.25 mg (04/14/2020), 0.25 mg (04/21/2020) CARBOplatin (PARAPLATIN) 300 mg in sodium chloride 0.9 % 250 mL chemo infusion, 300 mg (100 % of original dose 300 mg), Intravenous,  Once, 1 of 1 cycle Dose modification:   (original dose 300 mg, Cycle 1) Administration: 300 mg (03/11/2020), 290 mg (03/17/2020), 300 mg (03/24/2020), 230 mg (03/31/2020), 230 mg (04/07/2020), 230 mg (04/14/2020) PACLitaxel (TAXOL) 108 mg in sodium chloride 0.9 % 250 mL chemo infusion (</= 80mg/m2), 50 mg/m2 = 108 mg, Intravenous,  Once, 1 of 1 cycle Dose modification: 45 mg/m2 (original dose 50 mg/m2, Cycle 1, Reason: Dose not tolerated) Administration: 108 mg (03/11/2020), 108 mg (03/17/2020), 108 mg (03/24/2020), 96 mg (03/31/2020), 96 mg (04/07/2020), 96 mg (04/14/2020) fosaprepitant (EMEND) 150 mg in sodium chloride 0.9 % 145 mL IVPB, 150 mg, Intravenous,  Once, 1 of 1 cycle Administration: 150 mg (03/24/2020), 150 mg (03/31/2020), 150 mg (04/07/2020), 150 mg (04/14/2020) trastuzumab-anns (KANJINTI) 378 mg in sodium chloride 0.9 % 250 mL chemo infusion, 4 mg/kg = 378 mg (100 % of original dose 4 mg/kg), Intravenous,  Once, 1 of 1 cycle Dose modification: 4 mg/kg (original dose 4 mg/kg, Cycle 1, Reason: Other (see comments), Comment: loading dose) Administration: 378 mg  (03/11/2020), 189 mg (03/17/2020), 189 mg (03/24/2020), 189 mg (03/31/2020), 189 mg (04/07/2020), 189 mg (04/14/2020), 189 mg (04/21/2020), 189 mg (05/04/2020)  for chemotherapy treatment.    03/16/2020 - 05/08/2020 Radiation Therapy   Patient received 45 Gy in 25 treatments to pelvis Boost received 16.2 Gy in 9 treatments to vulva    Genetic Testing   Patient has genetic testing done for 06/22/2020. Results revealed patient has the following mutation(s): BRIP 1 mutation   Malignant neoplasm of upper-inner quadrant of left breast in female, estrogen receptor positive (HCC)  02/20/2020 Initial Diagnosis   PET scan on 02/07/20 following a diagnosis of vulvar cancer showed a 1.7cm left breast mass and mildly hypermetabolic left axillary lymph nodes. Mammogram and US on 02/18/20 showed a 3.1cm left breast mass at the 11 o'clock position, palpable on exam, and a single abnormal left axillary lymph node with cortical thickening. Left breast biopsy on 02/20/20 showed invasive and in situ carcinoma in the breast and axilla, grade 3, HER-2 positive (3+), ER/PR+ 95%, Ki67 75%   02/25/2020 Cancer Staging   Staging form: Breast, AJCC 8th Edition - Clinical stage from 02/25/2020: Stage IB (cT2, cN1, cM0, G3, ER+, PR+, HER2+) - Signed by Gudena, Vinay, MD on 02/25/2020   05/29/2020 - 05/29/2020 Chemotherapy   The patient had palonosetron (ALOXI) injection 0.25 mg, 0.25 mg, Intravenous,  Once, 1 of 3 cycles Administration: 0.25 mg (05/29/2020) pegfilgrastim-jmdb (FULPHILA) injection 6 mg, 6 mg, Subcutaneous,  Once, 1 of 3 cycles CARBOplatin (PARAPLATIN) 580 mg in sodium chloride 0.9 % 250 mL chemo infusion, 580 mg (100 % of original dose   580 mg), Intravenous,  Once, 1 of 3 cycles Dose modification:   (original dose 580 mg, Cycle 1) Administration: 580 mg (05/29/2020) DOCEtaxel (TAXOTERE) 120 mg in sodium chloride 0.9 % 250 mL chemo infusion, 60 mg/m2 = 120 mg (100 % of original dose 60 mg/m2), Intravenous,  Once, 1 of 3  cycles Dose modification: 60 mg/m2 (original dose 60 mg/m2, Cycle 1, Reason: Provider Judgment), 50 mg/m2 (original dose 60 mg/m2, Cycle 2, Reason: Dose not tolerated) Administration: 120 mg (05/29/2020) fosaprepitant (EMEND) 150 mg in sodium chloride 0.9 % 145 mL IVPB, 150 mg, Intravenous,  Once, 1 of 3 cycles Administration: 150 mg (05/29/2020) pertuzumab (PERJETA) 420 mg in sodium chloride 0.9 % 250 mL chemo infusion, 420 mg (50 % of original dose 840 mg), Intravenous, Once, 1 of 3 cycles Dose modification: 420 mg (original dose 840 mg, Cycle 1, Reason: Provider Judgment) Administration: 420 mg (05/29/2020) trastuzumab-dkst (OGIVRI) 525 mg in sodium chloride 0.9 % 250 mL chemo infusion, 6 mg/kg = 525 mg (75 % of original dose 8 mg/kg), Intravenous,  Once, 1 of 3 cycles Dose modification: 6 mg/kg (original dose 8 mg/kg, Cycle 1, Reason: Provider Judgment) Administration: 525 mg (05/29/2020)  for chemotherapy treatment.    06/22/2020 Genetic Testing   Positive genetic testing: Heterozygous pathogenic variant detected in BRIP1 gene at c.2010dup (p.Glu671*).  Variant of uncertain significance detected in POLD1 at c.532G>A (p.Gly178Arg).  No other pathogenic variants detected in [facility] Common Hereditary Cancers Panel.  The report date is June 22, 2020.   The Common Hereditary Cancers Panel offered by [facility] includes sequencing and/or deletion duplication testing of the following 48 genes: APC, ATM, AXIN2, BARD1, BMPR1A, BRCA1, BRCA2, BRIP1, CDH1, CDK4, CDKN2A (p14ARF), CDKN2A (p16INK4a), CHEK2, CTNNA1, DICER1, EPCAM (Deletion/duplication testing only), GREM1 (promoter region deletion/duplication testing only), KIT, MEN1, MLH1, MSH2, MSH3, MSH6, MUTYH, NBN, NF1, NHTL1, PALB2, PDGFRA, PMS2, POLD1, POLE, PTEN, RAD50, RAD51C, RAD51D, RNF43, SDHB, SDHC, SDHD, SMAD4, SMARCA4. STK11, TP53, TSC1, TSC2, and VHL.  The following genes were evaluated for sequence changes only: SDHA and HOXB13 c.251G>A variant  only.   07/27/2020 Surgery   Left mastectomy ([facility]): three foci of IDC, 3.7cm, 1.5cm, and 1.2cm, with intermediate to high grade DCIS, clear margins, with 1/2 left axillary lymph nodes positive for carcinoma.    08/18/2020 - 04/06/2021 Chemotherapy   Patient is on Treatment Plan : BREAST ADO-Trastuzumab Emtansine ([place]) q21d     09/09/2020 - 10/23/2020 Radiation Therapy   Adjuvant radiation    Sandra Miller is a 46 year old parous woman with a history of vulvar cancer who was seen in consultation at the request of Dr Gudena for evaluation of a deleterious mutation in BRIP1.   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  At the time of seeking consultation for sentinel lymph node biopsy with Dr. Van Le at [facility], it was determined that she was not a good candidate for primary radical vulvectomy due to the close proximity of the lesion to her anal sphincter.  Therefore she was treated with definitive external beam radiation to the lower pelvis and vulvar tissues.  Radiation dosing was between 03/16/20 through 05/08/20. It consisted of IMRT to the pelvis (45 Gy) and IMRT boost to the vulva (16.2 Gy).   Simultaneous to her diagnosis of vulvar cancer was a diagnosis of ER/PR positive breast cancer on the left.  This   was stage II with positive lymph node.  She underwent treatment with Adriamycin and Taxotere followed by left mastectomy, trastuzumab infusion for HER-2 positive lesion, and was planned for chest wall radiation beginning in the new year of 2022.   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   Screening ultrasound of the pelvis on 11/06/2020 revealed a uterus measuring 9.1 x 3.3 x 4.3 cm with an endometrial thickness of 6 mm and bilateral ovaries  which were normal in size and appearance.   On 05/18/21 she underwent robotic assisted total hysterectomy, BSO and vulvar biopsy. Intraoperative findings were significant for complete clinical response at vulva, normal appearing tubes and ovaries, normal appearing fibroid uterus. Surgery was uncomplicated.  Final pathology revealed benign tubes , ovaries, uterus. VIN 1 at vulva.   Since surgery she has done well with no major complaints.   Interval History: Patient notes feeling somewhat anxious and depressed.  She worries significantly about her cancers coming back.  She often feels like she is not able to live her life secondary to these worries.  She is still seeing her psychiatrist and has had some changes and increases to her medications.  There have been discussion about her seeing a psychologist but this has not happened yet.  She denies any vaginal bleeding or discharge.  Continues to have loose stools, unchanged from baseline.  She thinks this is probably worsened by drinking significant amount of caffeine.  She has not tried any bulking agents.  She has some intermittent pelvic cramping that feels like menstrual cramping, unchanged.  Appetite has been up and down, related to her mood.  Past Medical/Surgical History: Past Medical History:  Diagnosis Date   Arthritis    Bipolar 1 disorder ([place])    Cancer ([place])    Complication of anesthesia    wakes up during procedures   Depression    GAD (generalized anxiety disorder)    Genital HSV    currently per pt  no break out 03-22-2016    GERD (gastroesophageal reflux disease)    Hiatal hernia    History of cervical dysplasia    2012 laser ablation   History of esophageal dilatation    for dysphasia -- x2 dilated   History of gastric ulcer    History of Helicobacter pylori infection    remote hx   History of hidradenitis suppurativa    "gets all over body intermittantly"     History of hypertension    no issue since stopped  drinking alcohol 2014   History of kidney stones    History of panic attacks    History of radiation therapy 03/16/2020-05/08/2020   vulva  Dr Gery Pray   History of radiation therapy 09/09/2020-10/23/2020   left chest wall/left SCV   Dr Gery Pray   Hypertension    Iron deficiency anemia    Left ureteral stone    OCD (obsessive compulsive disorder)    PONV (postoperative nausea and vomiting)    Pre-diabetes    PTSD (post-traumatic stress disorder)    Recovering alcoholic in remission ([place])    since 2014   RLS (restless legs syndrome)    Smokers' cough ([place])    Thyroid disease    Urgency of urination    Yeast infection involving the vagina and surrounding area    secondary to taking antibiotic    Past Surgical History:  Procedure Laterality Date   CESAREAN SECTION  1995   w/  Bilateral Tubal Ligation   COLONOSCOPY  last one 08-09-2013   CYSTOSCOPY W/ URETERAL STENT PLACEMENT Left 03/29/2016   Procedure: CYSTOSCOPY WITH STENT REPLACEMENT;  Surgeon: Brian James Budzyn, MD;  Location: [facility];  Service: Urology;  Laterality: Left;   CYSTOSCOPY WITH RETROGRADE PYELOGRAM, URETEROSCOPY AND STENT PLACEMENT Left 03/08/2016   Procedure: CYSTOSCOPY WITH  LEFT RETROGRADE PYELOGRAM, AND STENT PLACEMENT;  Surgeon: Brian James Budzyn, MD;  Location: [facility];  Service: Urology;  Laterality: Left;   CYSTOSCOPY/RETROGRADE/URETEROSCOPY/STONE EXTRACTION WITH BASKET Left 03/29/2016   Procedure: CYSTOSCOPY/RETROGRADE/URETEROSCOPY/STONE EXTRACTION WITH BASKET;  Surgeon: Brian James Budzyn, MD;  Location: [facility];  Service: Urology;  Laterality: Left;   ENDOMETRIAL ABLATION W/ NOVASURE  04-01-2010   ESOPHAGOGASTRODUODENOSCOPY  last one 08-09-2013   KNEE ARTHROSCOPY Left as teen   LASER ABLATION OF THE CERVIX  2012 approx   MASTECTOMY WITH AXILLARY LYMPH NODE DISSECTION Left 07/27/2020   Procedure: LEFT MASTECTOMY WITH LEFT RADIOACTIVE SEED GUIDED TARGETED AXILLARY  LYMPH NODE DISSECTION;  Surgeon: Newman, David, MD;  Location: [facility];  Service: General;  Laterality: Left;   PORT-A-CATH REMOVAL Right 05/18/2021   Procedure: REMOVAL PORT-A-CATH;  Surgeon: Tsuei, Matthew, MD;  Location: [facility];  Service: General;  Laterality: Right;   PORTACATH PLACEMENT Right 03/06/2020   Procedure: INSERTION PORT-A-CATH WITH ULTRASOUND GUIDANCE;  Surgeon: Newman, David, MD;  Location: [facility];  Service: General;  Laterality: Right;   ROBOTIC ASSISTED TOTAL HYSTERECTOMY WITH BILATERAL SALPINGO OOPHERECTOMY N/A 05/18/2021   Procedure: XI ROBOTIC ASSISTED TOTAL HYSTERECTOMY WITH BILATERAL SALPINGO OOPHORECTOMY;  Surgeon: Rossi, Emma, MD;  Location: [facility];  Service: Gynecology;  Laterality: N/A;   TRANSTHORACIC ECHOCARDIOGRAM  05-19-2006   lvsf normal, ef 55-65%, there was mild flattening of the interventricular septum during diastoli/  RV size at upper limits normal   TUBAL LIGATION     VULVA /PERINEUM BIOPSY N/A 05/18/2021   Procedure: VULVAR BIOPSY;  Surgeon: Rossi, Emma, MD;  Location: [facility];  Service: Gynecology;  Laterality: N/A;   WISDOM TOOTH EXTRACTION  age 18 's    Family History  Problem Relation Age of Onset   Heart disease Father    Lung cancer Father        d. 52   Alcohol abuse Father    Heart disease Mother    Depression Mother    Anxiety disorder Mother    Drug abuse Brother    Alcohol abuse Brother    Drug abuse Brother    ADD / ADHD Brother    Colon polyps Brother    Cancer Paternal Grandfather        "stomach"   Diabetes Maternal Grandfather    Diabetes Paternal Grandmother    Kidney disease Maternal Uncle    Cirrhosis Cousin        alcoholic   Anxiety disorder Maternal Aunt    Depression Maternal Aunt    Cancer Cousin        maternal; ovarian cancer or other "female" cancer?   Lung cancer Paternal Uncle 75   Throat cancer Cousin        paternal; dx 50s   Lung cancer Cousin        paternal; dx 50s    Social History    Socioeconomic History   Marital status: Single    Spouse name: Not on file   Number of children: Not on file     Years of education: Not on file   Highest education level: Not on file  Occupational History   Not on file  Tobacco Use   Smoking status: Every Day    Packs/day: 0.50    Years: 22.00    Total pack years: 11.00    Types: Cigarettes   Smokeless tobacco: Never   Tobacco comments:    Recently started a smoking cessation class.   Vaping Use   Vaping Use: Never used  Substance and Sexual Activity   Alcohol use: Not Currently    Alcohol/week: 0.0 standard drinks of alcohol    Comment:  hx alcohollism  in remission since 2014   Drug use: Yes    Types: Marijuana    Comment: occas.   Sexual activity: Yes    Partners: Male    Birth control/protection: Surgical  Other Topics Concern   Not on file  Social History Narrative   Former [facility] patient.      Was on disability at one point.   Return to the workforce.  40 hours a week at [employer], 10 hours a week on the weekends at [employer] in [place] at night 10 to 12 hours a week.      Has grown children, she lives alone with a pet, continues to smoke no alcohol or drug use at this time      History of EtOH abuse and THC use.         Social Determinants of Health   Financial Resource Strain: Low Risk  (07/01/2021)   Overall Financial Resource Strain (CARDIA)    Difficulty of Paying Living Expenses: Not hard at all  Food Insecurity: No Food Insecurity (07/01/2021)   Hunger Vital Sign    Worried About Running Out of Food in the Last Year: Never true    Ran Out of Food in the Last Year: Never true  Transportation Needs: No Transportation Needs (07/01/2021)   PRAPARE - Hydrologist (Medical): No    Lack of Transportation (Non-Medical): No  Physical Activity: Inactive (07/01/2021)   Exercise Vital Sign    Days of Exercise per Week: 0 days    Minutes of Exercise  per Session: 0 min  Stress: Stress Concern Present (07/01/2021)   [place]    Feeling of Stress : Rather much  Social Connections: Socially Isolated (07/01/2021)   Social Connection and Isolation Panel [NHANES]    Frequency of Communication with Friends and Family: More than three times a week    Frequency of Social Gatherings with Friends and Family: More than three times a week    Attends Religious Services: Never    Marine scientist or Organizations: No    Attends Archivist Meetings: Never    Marital Status: Never married    Current Medications:  Current Outpatient Medications:    acetaminophen (TYLENOL) 500 MG tablet, Take 1 tablet (500 mg total) by mouth every 6 (six) hours as needed., Disp: 30 tablet, Rfl: 0   diclofenac Sodium (VOLTAREN) 1 % GEL, Apply 4 grams topically 4  times daily as needed., Disp: 500 g, Rfl: 6   doxylamine, Sleep, (UNISOM) 25 MG tablet, Take 25 mg by mouth at bedtime as needed for sleep., Disp: , Rfl:    FLUoxetine (PROZAC) 10 MG capsule, Take 3 capsules by mouth daily, Disp: 90 capsule, Rfl: 0   gabapentin (NEURONTIN) 300 MG capsule, Take 1  capsule (300 mg total) by mouth daily., Disp: 30 capsule, Rfl: 5   letrozole (FEMARA) 2.5 MG tablet, Take 1 tablet (2.5 mg total) by mouth daily., Disp: 90 tablet, Rfl: 3   letrozole (FEMARA) 2.5 MG tablet, Take 1 tablet (2.5 mg total) by mouth daily., Disp: 90 tablet, Rfl: 3   lisinopril (ZESTRIL) 20 MG tablet, TAKE 1/2 TABLET BY MOUTH ONCE A DAY, Disp: 90 tablet, Rfl: 3   loratadine (CLARITIN) 10 MG tablet, Take 10 mg by mouth daily., Disp: , Rfl:    LORazepam (ATIVAN) 1 MG tablet, Take 1 tablet by mouth 3 times a day as needed for anxiety., Disp: 90 tablet, Rfl: 1   methimazole (TAPAZOLE) 10 MG tablet, Take 1 tablet (10 mg total) by mouth 2 (two) times daily., Disp: 60 tablet, Rfl: 4   Multiple Vitamin (MULTIVITAMIN WITH MINERALS) TABS  tablet, Take 1 tablet by mouth daily., Disp: , Rfl:    nicotine (NICODERM CQ) 21 mg/24hr patch, Place 1 patch (21 mg total) onto the skin daily., Disp: 28 patch, Rfl: 0   OLANZapine (ZYPREXA) 15 MG tablet, Take 1 tablet by mouth at bedtime., Disp: 30 tablet, Rfl: 1   omeprazole (PRILOSEC) 40 MG capsule, TAKE 1 CAPSULE BY MOUTH EVERY MORNING AND EVERY NIGHT AT BEDTIME, Disp: 60 capsule, Rfl: 3   ondansetron (ZOFRAN-ODT) 4 MG disintegrating tablet, Take 1 tablet (4 mg total) by mouth every 8 (eight) hours as needed for nausea or vomiting., Disp: 10 tablet, Rfl: 0   promethazine (PHENERGAN) 25 MG tablet, TAKE 1 TABLET BY MOUTH EVERY 6 HOURS AS NEEDED FOR NAUSEA, Disp: 30 tablet, Rfl: 6   propranolol (INDERAL) 10 MG tablet, Take 1 tablet by mouth 2 (two) times daily., Disp: 180 tablet, Rfl: 1   rosuvastatin (CRESTOR) 10 MG tablet, TAKE 1 TABLET BY MOUTH ONCE A DAY, Disp: 90 tablet, Rfl: 3   senna-docusate (SENOKOT-S) 8.6-50 MG tablet, Take 2 tablets by mouth at bedtime. For AFTER surgery only, do not take if having diarrhea, Disp: 30 tablet, Rfl: 0   vortioxetine HBr (TRINTELLIX) 10 MG TABS tablet, Take 1 tablet (10 mg total) by mouth daily., Disp: 30 tablet, Rfl: 1 No current facility-administered medications for this visit.  Facility-Administered Medications Ordered in Other Visits:    LORazepam (ATIVAN) injection 0.5 mg, 0.5 mg, Intravenous, Once, Tanner, Lyndon Code., PA-C  Review of Systems: Denies fevers, chills, fatigue, unexplained weight changes. Denies hearing loss, neck lumps or masses, mouth sores, ringing in ears or voice changes. Denies cough or wheezing.  Denies shortness of breath. Denies chest pain or palpitations. Denies leg swelling. Denies abdominal distention, pain, blood in stools, constipation, nausea, vomiting, or early satiety. Denies pain with intercourse, dysuria, frequency, hematuria or incontinence. Denies hot flashes, vaginal bleeding or vaginal discharge.   Denies joint  pain, back pain or muscle pain/cramps. Denies itching, rash, or wounds. Denies dizziness, headaches, numbness or seizures. Denies swollen lymph nodes or glands, denies easy bruising or bleeding. Denies confusion, or decreased concentration.  Physical Exam: BP 125/78 (Patient Position: Sitting)   Pulse 84   Temp (!) 97.5 F (36.4 C) (Tympanic)   Resp 16   Ht 5' 5" (1.651 m)   Wt 193 lb (87.5 kg)   SpO2 98%   BMI 32.12 kg/m  General: Alert, oriented, no acute distress. HEENT: Normocephalic, atraumatic, sclera anicteric. Chest: Clear to auscultation bilaterally.  No wheezes or rhonchi. Cardiovascular: Regular rate and rhythm, no murmurs. Abdomen: soft, nontender.  Normoactive bowel sounds.  No masses or hepatosplenomegaly appreciated.   Extremities: Grossly normal range of motion.  Warm, well perfused.  No edema bilaterally. Lymphatics: No cervical, supraclavicular, or inguinal adenopathy. GU: External female genitalia with changes consistent with history of radiation. No visible lesions, hyperkeratosis, or ulcerations.  Mild hypopigmentation along the perineal body.  On vaginal exam, well rugated vaginal mucosa, minimal physiologic discharge, no bleeding.  No masses or lesions.  Cuff intact.  On bimanual exam, no masses or nodularity.  Laboratory & Radiologic Studies: None new  Assessment & Plan: Sandra Miller is a 46 y.o. woman with a history of stage IB vulvar cancer s/p definitive radiation completed in 05/2020, s/p diagnosis of HR positive left breast cancer, and deleterious mutation in BRIP1 who underwent risk reducing hysterectomy and BSO in 05/2021.  Presents today for surveillance visit.   Patient is overall doing well and is NED on vulvar exam today.  We discussed continued surveillance visits every 3 months -she will be 2 years out from completion of treatment in September.  We will plan to continue visits every 3 months until at least the end of the year and then likely  transition to visits every 6-month.   In the setting of her loose stools, we discussed bulking agents today.  I offered referral to our social workers here given her significant depression and anxiety related to cancer diagnoses.  Patient was amenable to this referral.  I sent a message in epic.  I also think that she would benefit from getting involved in some of our cancer support groups.   In terms of her deleterious mutation in BRIP 1 conferring increased hereditary risk of ovarian cancer, she is s/p robotic assisted total hysterectomy with BSO for risk reduction in September, 2022.   28 minutes of total time was spent for this patient encounter, including preparation, face-to-face counseling with the patient and coordination of care, and documentation of the encounter.   , MD  Division of Gynecologic Oncology  Department of Obstetrics and Gynecology  [facility]   

## 2022-03-23 ENCOUNTER — Telehealth: Payer: Self-pay

## 2022-03-23 NOTE — Telephone Encounter (Signed)
Per the request of Dr. Berline Lopes, [place] attempted to contact patient to provide support and to discuss support groups.  CSW left a vm.  Will follow up on Friday, 03/25/22.

## 2022-03-30 ENCOUNTER — Other Ambulatory Visit (HOSPITAL_COMMUNITY): Payer: Self-pay

## 2022-03-31 ENCOUNTER — Other Ambulatory Visit (HOSPITAL_COMMUNITY): Payer: Self-pay

## 2022-04-01 ENCOUNTER — Other Ambulatory Visit (HOSPITAL_COMMUNITY): Payer: Self-pay

## 2022-04-01 ENCOUNTER — Other Ambulatory Visit (HOSPITAL_COMMUNITY): Payer: Self-pay | Admitting: *Deleted

## 2022-04-01 DIAGNOSIS — F411 Generalized anxiety disorder: Secondary | ICD-10-CM

## 2022-04-01 DIAGNOSIS — F319 Bipolar disorder, unspecified: Secondary | ICD-10-CM

## 2022-04-01 MED ORDER — FLUOXETINE HCL 10 MG PO CAPS
ORAL_CAPSULE | ORAL | 0 refills | Status: DC
  Filled 2022-04-01: qty 42, 14d supply, fill #0

## 2022-04-05 ENCOUNTER — Telehealth: Payer: Self-pay | Admitting: Hematology and Oncology

## 2022-04-05 NOTE — Telephone Encounter (Signed)
Rescheduled appointment per provider PAL. Left voicemail. 

## 2022-04-13 ENCOUNTER — Telehealth (HOSPITAL_COMMUNITY): Payer: PRIVATE HEALTH INSURANCE | Admitting: Psychiatry

## 2022-04-14 ENCOUNTER — Telehealth (HOSPITAL_COMMUNITY): Payer: PRIVATE HEALTH INSURANCE | Admitting: Psychiatry

## 2022-04-15 ENCOUNTER — Other Ambulatory Visit (HOSPITAL_COMMUNITY): Payer: Self-pay

## 2022-04-15 MED ORDER — OLANZAPINE 15 MG PO TABS
15.0000 mg | ORAL_TABLET | Freq: Every day | ORAL | 0 refills | Status: DC
  Filled 2022-04-15 – 2022-04-18 (×2): qty 30, 30d supply, fill #0

## 2022-04-15 MED ORDER — LORAZEPAM 1 MG PO TABS
ORAL_TABLET | ORAL | 0 refills | Status: DC
  Filled 2022-04-15: qty 90, 30d supply, fill #0
  Filled 2022-04-16: qty 90, fill #0
  Filled 2022-04-18: qty 90, 30d supply, fill #0

## 2022-04-15 NOTE — Telephone Encounter (Signed)
I sent her prescription Ativan 1 mg 3 times a day and olanzapine 15 mg at bedtime to visit a long pharmacy for 30 days.  She should keep the appointment.  We started her on the medicine Trintellix and provide samples and not sure if she is taking it or went back to Prozac.  I did not send these medication because I need clarification.

## 2022-04-15 NOTE — Telephone Encounter (Signed)
Pt called asking for refill until next appointment on 8/24. Missed appt yesterday due to working 3rd shift. Needs all filled. [place]. Last appt 02/21/22.

## 2022-04-16 ENCOUNTER — Other Ambulatory Visit (HOSPITAL_COMMUNITY): Payer: Self-pay

## 2022-04-16 ENCOUNTER — Other Ambulatory Visit (HOSPITAL_COMMUNITY): Payer: Self-pay | Admitting: Psychiatry

## 2022-04-16 DIAGNOSIS — F319 Bipolar disorder, unspecified: Secondary | ICD-10-CM

## 2022-04-16 DIAGNOSIS — F411 Generalized anxiety disorder: Secondary | ICD-10-CM

## 2022-04-18 ENCOUNTER — Other Ambulatory Visit (HOSPITAL_COMMUNITY): Payer: Self-pay

## 2022-04-19 ENCOUNTER — Other Ambulatory Visit (HOSPITAL_COMMUNITY): Payer: Self-pay

## 2022-04-22 ENCOUNTER — Other Ambulatory Visit (HOSPITAL_COMMUNITY): Payer: Self-pay

## 2022-04-23 ENCOUNTER — Other Ambulatory Visit (HOSPITAL_COMMUNITY): Payer: Self-pay

## 2022-04-28 ENCOUNTER — Encounter (HOSPITAL_COMMUNITY): Payer: Self-pay | Admitting: Psychiatry

## 2022-04-28 ENCOUNTER — Telehealth (HOSPITAL_BASED_OUTPATIENT_CLINIC_OR_DEPARTMENT_OTHER): Payer: PRIVATE HEALTH INSURANCE | Admitting: Psychiatry

## 2022-04-28 ENCOUNTER — Ambulatory Visit: Payer: PRIVATE HEALTH INSURANCE | Admitting: Hematology and Oncology

## 2022-04-28 ENCOUNTER — Other Ambulatory Visit (HOSPITAL_COMMUNITY): Payer: Self-pay

## 2022-04-28 DIAGNOSIS — F319 Bipolar disorder, unspecified: Secondary | ICD-10-CM | POA: Diagnosis not present

## 2022-04-28 DIAGNOSIS — F411 Generalized anxiety disorder: Secondary | ICD-10-CM | POA: Diagnosis not present

## 2022-04-28 MED ORDER — LORAZEPAM 1 MG PO TABS
1.0000 mg | ORAL_TABLET | Freq: Three times a day (TID) | ORAL | 0 refills | Status: DC | PRN
  Filled 2022-04-28: qty 90, fill #0
  Filled 2022-05-21 – 2022-05-24 (×2): qty 90, 30d supply, fill #0

## 2022-04-28 MED ORDER — OLANZAPINE 15 MG PO TABS
15.0000 mg | ORAL_TABLET | Freq: Every day | ORAL | 1 refills | Status: DC
  Filled 2022-04-28 – 2022-05-21 (×2): qty 30, 30d supply, fill #0

## 2022-04-28 MED ORDER — FLUOXETINE HCL 10 MG PO CAPS
20.0000 mg | ORAL_CAPSULE | Freq: Every day | ORAL | 1 refills | Status: DC
  Filled 2022-04-28: qty 90, 30d supply, fill #0
  Filled 2022-05-24: qty 90, 30d supply, fill #1

## 2022-04-28 NOTE — Progress Notes (Signed)
Virtual Visit via Telephone Note  I connected with [place] on 04/28/22 at 10:40 AM EDT by telephone and verified that I am speaking with the correct person using two identifiers.  Location: Patient: Home Provider: Home Office   I discussed the limitations, risks, security and privacy concerns of performing an evaluation and management service by telephone and the availability of in person appointments. I also discussed with the patient that there may be a patient responsible charge related to this service. The patient expressed understanding and agreed to proceed.   History of Present Illness: Patient is evaluated by phone session.  She has missed appointment because she overslept.  She reported lately more tired, fatigued, lack of physiological symptoms.  Sometimes she feels that careless with the attitude "I do not care".  Last week her uncle passed away and she was very sad.  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  She also reported missed her blood work which was requested by endocrinologist.  Her challenges are multiple health issues and current job.  She feels the job is very challenging and lately issues with micromanagement and bladder changes every few weeks.  She feels drained and tired.  Sometimes she has no energy.  She also reported increased heart rate, tremors, cold intolerance.  She lost weight recently.  Her endocrinologist had discontinued the propranolol when she started the new medication for Graves' disease.  She feels that her anxiety is increased.  She also reported not taking the Prozac after Trintellix did not approve from insurance.  We have provided bridge supply Prozac but then she missed appointment and did not get the refills.  She is taking Ativan and olanzapine.  She denies any suicidal thoughts or homicidal thoughts but endorsed anxiety, irritability, fatigue.   Past Psychiatric History:  H/O  bipolar disorder, alcohol dependence and marijuana abuse.  H/O at least 6 inpatient. Tried lithium, Seroquel, Lamictal, Paxil, Celexa, Abilify, Geodon, Klonopin, Depakote, Neurontin and Cymbalta.  No H/o history of suicidal attempt.   Psychiatric Specialty Exam: Physical Exam  Review of Systems  Constitutional:  Positive for fatigue and unexpected weight change.  Endocrine: Positive for cold intolerance.  Neurological:  Positive for tremors.    Weight 193 lb (87.5 kg).There is no height or weight on file to calculate BMI.  General Appearance: NA  Eye Contact:  NA  Speech:  Slow  Volume:  Decreased  Mood:  Anxious and Depressed  Affect:  NA  Thought Process:  Descriptions of Associations: Intact  Orientation:  Full (Time, Place, and Person)  Thought Content:  Rumination  Suicidal Thoughts:  No  Homicidal Thoughts:  No  Memory:  Immediate;   Good Recent;   Good Remote;   Good  Judgement:  Fair  Insight:  Shallow  Psychomotor Activity:  NA  Concentration:  Concentration: Fair and Attention Span: Fair  Recall:  [place] Gap of Knowledge:  Good  Language:  Good  Akathisia:  No  Handed:  Right  AIMS (if indicated):     Assets:  Communication Skills Desire for Improvement Housing Talents/Skills Transportation  ADL's:  Intact  Cognition:  WNL  Sleep:   fair      Assessment and Plan: Bipolar disorder type I.  Generalized anxiety disorder.  I reviewed her symptoms, current medication.  Patient has a lot of symptoms consistent with worsening of Graves' disease.  She  admitted not had a blood work which was recommended by endocrinologist.  She also very anxious as next week having appointment with her cancer doctor and nervous if the cancer coming back.  We talk about seeing a therapist and go back on Prozac since Trintellix not approved.  Recommend to do Prozac 20 mg however if tolerated very well and no side effects then consider 30 mg.  Continue Ativan 1 mg 3 times a day and  olanzapine 50 mg at bedtime.  Recommend to address her physical symptoms with endocrinologist.  If symptoms do not improved after addressing the medical issues then we may consider adding low-dose BuSpar.  Recommended to call us back if he has any question or any concern.  Discussed safety concerns that anytime having active suicidal thoughts or homicidal thoughts.  To call 911 or go to local emergency room. Follow up in six weeks.  Follow Up Instructions:    I discussed the assessment and treatment plan with the patient. The patient was provided an opportunity to ask questions and all were answered. The patient agreed with the plan and demonstrated an understanding of the instructions.   The patient was advised to call back or seek an in-person evaluation if the symptoms worsen or if the condition fails to improve as anticipated.  Collaboration of Care: Other provider involved in patient's care AEB notes are available in epic to review.  Patient/Guardian was advised Release of Information must be obtained prior to any record release in order to collaborate their care with an outside provider. Patient/Guardian was advised if they have not already done so to contact the registration department to sign all necessary forms in order for [place] to release information regarding their care.   Consent: Patient/Guardian gives verbal consent for treatment and assignment of benefits for services provided during this visit. Patient/Guardian expressed understanding and agreed to proceed.    I provided 28 minutes of non-face-to-face time during this encounter.   Kathlee Nations, MD

## 2022-05-02 ENCOUNTER — Inpatient Hospital Stay: Payer: PRIVATE HEALTH INSURANCE | Admitting: Hematology and Oncology

## 2022-05-10 ENCOUNTER — Other Ambulatory Visit (HOSPITAL_COMMUNITY): Payer: Self-pay

## 2022-05-10 DIAGNOSIS — E0500 Thyrotoxicosis with diffuse goiter without thyrotoxic crisis or storm: Secondary | ICD-10-CM | POA: Insufficient documentation

## 2022-05-10 MED ORDER — METHIMAZOLE 5 MG PO TABS
5.0000 mg | ORAL_TABLET | Freq: Every day | ORAL | 1 refills | Status: DC
  Filled 2022-05-10 – 2022-05-25 (×2): qty 90, 90d supply, fill #0
  Filled 2022-08-18: qty 90, 90d supply, fill #1

## 2022-05-11 NOTE — Progress Notes (Signed)
Patient Care Team: Ladell Pier, MD as PCP - General (Internal Medicine) Nicholas Lose, MD as Consulting Physician (Hematology and Oncology) Alphonsa Overall, MD as Consulting Physician (General Surgery) Awanda Mink, Craige Cotta, RN as Oncology Nurse Navigator (Oncology) Everitt Amber, MD as Consulting Physician (Gynecologic Oncology) Dorothyann Gibbs, NP as Nurse Practitioner (Gynecologic Oncology) Gery Pray, MD as Consulting Physician (Radiation Oncology) Harmon Pier, RN as Registered Nurse Lahoma Crocker, MD as Consulting Physician (Obstetrics and Gynecology)  DIAGNOSIS:  Encounter Diagnosis  Name Primary?   Malignant neoplasm of upper-inner quadrant of left breast in female, estrogen receptor positive ([place])     SUMMARY OF ONCOLOGIC HISTORY: Oncology History  Vulvar cancer ([place])  01/29/2020 Initial Diagnosis   Vulvar cancer ([place])   03/11/2020 - 05/04/2020 Chemotherapy   The patient had dexamethasone (DECADRON) 4 MG tablet, 4 mg (100 % of original dose 4 mg), Oral, Daily, 1 of 1 cycle, Start date: 02/27/2020, End date: 05/05/2020 Dose modification: 4 mg (original dose 4 mg, Cycle 0) palonosetron (ALOXI) injection 0.25 mg, 0.25 mg, Intravenous,  Once, 1 of 1 cycle Administration: 0.25 mg (03/11/2020), 0.25 mg (03/17/2020), 0.25 mg (03/24/2020), 0.25 mg (03/31/2020), 0.25 mg (04/07/2020), 0.25 mg (04/14/2020), 0.25 mg (04/21/2020) CARBOplatin (PARAPLATIN) 300 mg in sodium chloride 0.9 % 250 mL chemo infusion, 300 mg (100 % of original dose 300 mg), Intravenous,  Once, 1 of 1 cycle Dose modification:   (original dose 300 mg, Cycle 1) Administration: 300 mg (03/11/2020), 290 mg (03/17/2020), 300 mg (03/24/2020), 230 mg (03/31/2020), 230 mg (04/07/2020), 230 mg (04/14/2020) PACLitaxel (TAXOL) 108 mg in sodium chloride 0.9 % 250 mL chemo infusion (</= $RemoveBefor'80mg'GMvLiCLYIhHZ$ /m2), 50 mg/m2 = 108 mg, Intravenous,  Once, 1 of 1 cycle Dose modification: 45 mg/m2 (original dose 50 mg/m2, Cycle 1, Reason: Dose not  tolerated) Administration: 108 mg (03/11/2020), 108 mg (03/17/2020), 108 mg (03/24/2020), 96 mg (03/31/2020), 96 mg (04/07/2020), 96 mg (04/14/2020) fosaprepitant (EMEND) 150 mg in sodium chloride 0.9 % 145 mL IVPB, 150 mg, Intravenous,  Once, 1 of 1 cycle Administration: 150 mg (03/24/2020), 150 mg (03/31/2020), 150 mg (04/07/2020), 150 mg (04/14/2020) trastuzumab-anns (KANJINTI) 378 mg in sodium chloride 0.9 % 250 mL chemo infusion, 4 mg/kg = 378 mg (100 % of original dose 4 mg/kg), Intravenous,  Once, 1 of 1 cycle Dose modification: 4 mg/kg (original dose 4 mg/kg, Cycle 1, Reason: Other (see comments), Comment: loading dose) Administration: 378 mg (03/11/2020), 189 mg (03/17/2020), 189 mg (03/24/2020), 189 mg (03/31/2020), 189 mg (04/07/2020), 189 mg (04/14/2020), 189 mg (04/21/2020), 189 mg (05/04/2020)  for chemotherapy treatment.    03/16/2020 - 05/08/2020 Radiation Therapy   Patient received 45 Gy in 25 treatments to pelvis Boost received 16.2 Gy in 9 treatments to vulva    Genetic Testing   Patient has genetic testing done for 06/22/2020. Results revealed patient has the following mutation(s): BRIP 1 mutation   Malignant neoplasm of upper-inner quadrant of left breast in female, estrogen receptor positive ([place])  02/20/2020 Initial Diagnosis   PET scan on 02/07/20 following a diagnosis of vulvar cancer showed a 1.7cm left breast mass and mildly hypermetabolic left axillary lymph nodes. Mammogram and [place] on 02/18/20 showed a 3.1cm left breast mass at the 11 o'clock position, palpable on exam, and a single abnormal left axillary lymph node with cortical thickening. Left breast biopsy on 02/20/20 showed invasive and in situ carcinoma in the breast and axilla, grade 3, HER-2 positive (3+), ER/PR+ 95%, Ki67 75%   02/25/2020 Cancer Staging  Staging form: Breast, AJCC 8th Edition - Clinical stage from 02/25/2020: Stage IB (cT2, cN1, cM0, G3, ER+, PR+, HER2+) - Signed by Nicholas Lose, MD on 02/25/2020   05/29/2020 -  05/29/2020 Chemotherapy   The patient had palonosetron (ALOXI) injection 0.25 mg, 0.25 mg, Intravenous,  Once, 1 of 3 cycles Administration: 0.25 mg (05/29/2020) pegfilgrastim-jmdb (FULPHILA) injection 6 mg, 6 mg, Subcutaneous,  Once, 1 of 3 cycles CARBOplatin (PARAPLATIN) 580 mg in sodium chloride 0.9 % 250 mL chemo infusion, 580 mg (100 % of original dose 580 mg), Intravenous,  Once, 1 of 3 cycles Dose modification:   (original dose 580 mg, Cycle 1) Administration: 580 mg (05/29/2020) DOCEtaxel (TAXOTERE) 120 mg in sodium chloride 0.9 % 250 mL chemo infusion, 60 mg/m2 = 120 mg (100 % of original dose 60 mg/m2), Intravenous,  Once, 1 of 3 cycles Dose modification: 60 mg/m2 (original dose 60 mg/m2, Cycle 1, Reason: Provider Judgment), 50 mg/m2 (original dose 60 mg/m2, Cycle 2, Reason: Dose not tolerated) Administration: 120 mg (05/29/2020) fosaprepitant (EMEND) 150 mg in sodium chloride 0.9 % 145 mL IVPB, 150 mg, Intravenous,  Once, 1 of 3 cycles Administration: 150 mg (05/29/2020) pertuzumab (PERJETA) 420 mg in sodium chloride 0.9 % 250 mL chemo infusion, 420 mg (50 % of original dose 840 mg), Intravenous, Once, 1 of 3 cycles Dose modification: 420 mg (original dose 840 mg, Cycle 1, Reason: Provider Judgment) Administration: 420 mg (05/29/2020) trastuzumab-dkst (OGIVRI) 525 mg in sodium chloride 0.9 % 250 mL chemo infusion, 6 mg/kg = 525 mg (75 % of original dose 8 mg/kg), Intravenous,  Once, 1 of 3 cycles Dose modification: 6 mg/kg (original dose 8 mg/kg, Cycle 1, Reason: Provider Judgment) Administration: 525 mg (05/29/2020)  for chemotherapy treatment.    06/22/2020 Genetic Testing   Positive genetic testing: Heterozygous pathogenic variant detected in BRIP1 gene at c.2010dup (p.Glu671*).  Variant of uncertain significance detected in POLD1 at c.532G>A (p.Gly178Arg).  No other pathogenic variants detected in [facility] Common Hereditary Cancers Panel.  The report date is June 22, 2020.   The  Common Hereditary Cancers Panel offered by [facility] includes sequencing and/or deletion duplication testing of the following 48 genes: APC, ATM, AXIN2, BARD1, BMPR1A, BRCA1, BRCA2, BRIP1, CDH1, CDK4, CDKN2A (p14ARF), CDKN2A (p16INK4a), CHEK2, CTNNA1, DICER1, EPCAM (Deletion/duplication testing only), GREM1 (promoter region deletion/duplication testing only), KIT, MEN1, MLH1, MSH2, MSH3, MSH6, MUTYH, NBN, NF1, NHTL1, PALB2, PDGFRA, PMS2, POLD1, POLE, PTEN, RAD50, RAD51C, RAD51D, RNF43, SDHB, SDHC, SDHD, SMAD4, SMARCA4. STK11, TP53, TSC1, TSC2, and VHL.  The following genes were evaluated for sequence changes only: SDHA and HOXB13 c.251G>A variant only.   07/27/2020 Surgery   Left mastectomy [name]): three foci of IDC, 3.7cm, 1.5cm, and 1.2cm, with intermediate to high grade DCIS, clear margins, with 1/2 left axillary lymph nodes positive for carcinoma.    08/18/2020 - 04/06/2021 Chemotherapy   Patient is on Treatment Plan : BREAST ADO-Trastuzumab Emtansine (Kadcyla) q21d     09/09/2020 - 10/23/2020 Radiation Therapy   Adjuvant radiation     CHIEF COMPLIANT: Follow-up of left breast cancer and vulvar cancer on anastrozole  INTERVAL HISTORY: Sandra Miller is a 46 y.o. with above-mentioned history of left breast cancer and vulvar cancer who completed neoadjuvant chemotherapy, underwent a left mastectomy, radiation, and is currently on Kadcyla maintenance. She also has a history of DVT currently on Xarelto. She presents to the clinic today for follow-up. She states that she has some extreme heat and cold. The depression has been terrible. She have  not been having time for exercise because she works 3rd shift. Denies numbness and tingling in fingers and toes.   ALLERGIES:  is allergic to dilaudid [hydromorphone hcl], depakote [divalproex sodium], minocycline, and aspirin.  MEDICATIONS:  Current Outpatient Medications  Medication Sig Dispense Refill   acetaminophen (TYLENOL) 500 MG tablet Take 1 tablet  (500 mg total) by mouth every 6 (six) hours as needed. 30 tablet 0   diclofenac Sodium (VOLTAREN) 1 % GEL Apply 4 grams topically 4  times daily as needed. 500 g 6   doxylamine, Sleep, (UNISOM) 25 MG tablet Take 25 mg by mouth at bedtime as needed for sleep.     FLUoxetine (PROZAC) 10 MG capsule Take 2-3 capsules (20-30 mg total) by mouth daily. 90 capsule 1   gabapentin (NEURONTIN) 300 MG capsule Take 1 capsule (300 mg total) by mouth daily. 30 capsule 5   letrozole (FEMARA) 2.5 MG tablet Take 1 tablet (2.5 mg total) by mouth daily. 90 tablet 3   letrozole (FEMARA) 2.5 MG tablet Take 1 tablet (2.5 mg total) by mouth daily. 90 tablet 3   lisinopril (ZESTRIL) 20 MG tablet TAKE 1/2 TABLET BY MOUTH ONCE A DAY 90 tablet 3   loratadine (CLARITIN) 10 MG tablet Take 10 mg by mouth daily.     LORazepam (ATIVAN) 1 MG tablet Take 1 tablet by mouth 3 times a day as needed for anxiety. 90 tablet 0   methimazole (TAPAZOLE) 10 MG tablet Take 1 tablet (10 mg total) by mouth 2 (two) times daily. 60 tablet 4   methimazole (TAPAZOLE) 5 MG tablet Take one tablet (5 mg dose) by mouth daily. 90 tablet 1   Multiple Vitamin (MULTIVITAMIN WITH MINERALS) TABS tablet Take 1 tablet by mouth daily.     nicotine (NICODERM CQ) 14 mg/24hr patch Place 1 patch (14 mg total) onto the skin daily. 30 patch 0   OLANZapine (ZYPREXA) 15 MG tablet Take 1 tablet (15 mg total) by mouth at bedtime. 30 tablet 1   omeprazole (PRILOSEC) 40 MG capsule TAKE 1 CAPSULE BY MOUTH EVERY MORNING AND EVERY NIGHT AT BEDTIME 60 capsule 3   ondansetron (ZOFRAN-ODT) 4 MG disintegrating tablet Take 1 tablet (4 mg total) by mouth every 8 (eight) hours as needed for nausea or vomiting. 10 tablet 0   promethazine (PHENERGAN) 25 MG tablet TAKE 1 TABLET BY MOUTH EVERY 6 HOURS AS NEEDED FOR NAUSEA 30 tablet 6   propranolol (INDERAL) 10 MG tablet Take 1 tablet by mouth 2 (two) times daily. 180 tablet 1   rosuvastatin (CRESTOR) 10 MG tablet TAKE 1 TABLET BY MOUTH  ONCE A DAY 90 tablet 3   senna-docusate (SENOKOT-S) 8.6-50 MG tablet Take 2 tablets by mouth at bedtime. For AFTER surgery only, do not take if having diarrhea 30 tablet 0   No current facility-administered medications for this visit.    PHYSICAL EXAMINATION: ECOG PERFORMANCE STATUS: 1 - Symptomatic but completely ambulatory  Vitals:   05/12/22 0803  BP: 137/75  Pulse: 80  Resp: 18  Temp: 97.9 F (36.6 C)  SpO2: 100%   Filed Weights   05/12/22 0803  Weight: 201 lb 4.8 oz (91.3 kg)    BREAST: No palpable masses or nodules in either right breast and left chest wall. No palpable axillary supraclavicular or infraclavicular adenopathy no breast tenderness or nipple discharge. (exam performed in the presence of a chaperone)  LABORATORY DATA:  I have reviewed the data as listed    Latest Ref Rng &  Units 02/11/2022    4:07 PM 08/06/2021    9:31 AM 07/01/2021    9:26 AM  CMP  Glucose 70 - 99 mg/dL 103   102   BUN 6 - 20 mg/dL 16   14   Creatinine 0.44 - 1.00 mg/dL 0.67  0.50  0.67   Sodium 135 - 145 mmol/L 136   139   Potassium 3.5 - 5.1 mmol/L 3.4   3.9   Chloride 98 - 111 mmol/L 100   104   CO2 22 - 32 mmol/L 21   24   Calcium 8.9 - 10.3 mg/dL 9.7   10.6   Total Protein 6.5 - 8.1 g/dL 8.3   7.6   Total Bilirubin 0.3 - 1.2 mg/dL 1.3   0.6   Alkaline Phos 38 - 126 U/L 85   64   AST 15 - 41 U/L 42   27   ALT 0 - 44 U/L 22   31     Lab Results  Component Value Date   WBC 8.5 02/11/2022   HGB 14.3 02/11/2022   HCT 42.8 02/11/2022   MCV 89.2 02/11/2022   PLT 272 02/11/2022   NEUTROABS 4.5 07/01/2021    ASSESSMENT & PLAN:  Malignant neoplasm of upper-inner quadrant of left breast in female, estrogen receptor positive ([place]) Breast cancer and vulvar cancers   02/20/2020:PET scan on 02/07/20 following a diagnosis of vulvar cancer showed a 1.7cm left breast mass and mildly hypermetabolic left axillary lymph nodes. Mammogram and [place] on 02/18/20 showed a 3.1cm left breast mass at  the 11 o'clock position, palpable on exam, and a single abnormal left axillary lymph node with cortical thickening. Left breast biopsy on 02/20/20 showed invasive and in situ carcinoma in the breast and axilla, grade 3, HER-2 positive (3+), ER/PR+ 95%, Ki67 75%   Treatment plan: 1.  Neoadjuvant chemoradiation for vulvar cancer with Taxol, carboplatin, Herceptin weekly x6 cycles 2. neoadjuvant chemotherapy with TCH Perjeta x3 cycles 3. 07/27/20: Left mastectomy [name]): three foci of IDC, 3.7cm, 1.5cm, and 1.2cm, with intermediate to high grade DCIS, clear margins, with 1/2 left axillary lymph nodes positive for carcinoma. 4. Followed by adjuvant radiation therapy to be completed on 10/23/20 5.  Kadcyla maintenance completed 04/06/2021 6.  Adjuvant antiestrogen therapy with anastrozole started 12/01/2020 switched to letrozole 02/23/2021 7.  Adjuvant neratinib ------------------------------------------------------------------------------------------------------------------- Current treatment: Letrozole   Letrozole toxicities: 1.  Severe hot flashes   2.  Depression: Sees psychiatry.   severe hyperthyroidism/Graves' disease.    She is struggling to get this under control   Left jugular DVT: Started on anticoagulation October 2021: Ultrasound 11/04/2021: Negative for DVT.  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  She is happy about it.  Tobacco cessation: She is now on the second month of NicoDerm patches and she will start taking the 14 mcg patch dose  Return to clinic in 1 year for follow-up    No orders of the defined types were placed in this encounter.  The patient has a good understanding of the overall plan. she agrees with it. she will call with any problems that may develop before the next visit here. Total time spent: 30 mins including face to face time and time spent for planning, charting and co-ordination of  care   Harriette Ohara, MD 05/12/22    I Gardiner Coins am scribing for Dr. Lindi Adie  I have reviewed  the above documentation for accuracy and completeness, and I agree with the above.

## 2022-05-12 ENCOUNTER — Inpatient Hospital Stay: Payer: PRIVATE HEALTH INSURANCE | Attending: Gynecologic Oncology | Admitting: Hematology and Oncology

## 2022-05-12 ENCOUNTER — Other Ambulatory Visit: Payer: Self-pay

## 2022-05-12 ENCOUNTER — Other Ambulatory Visit (HOSPITAL_COMMUNITY): Payer: Self-pay

## 2022-05-12 DIAGNOSIS — E0500 Thyrotoxicosis with diffuse goiter without thyrotoxic crisis or storm: Secondary | ICD-10-CM | POA: Insufficient documentation

## 2022-05-12 DIAGNOSIS — Z170 Estrogen receptor positive status [ER+]: Secondary | ICD-10-CM | POA: Insufficient documentation

## 2022-05-12 DIAGNOSIS — Z9012 Acquired absence of left breast and nipple: Secondary | ICD-10-CM | POA: Diagnosis not present

## 2022-05-12 DIAGNOSIS — Z86718 Personal history of other venous thrombosis and embolism: Secondary | ICD-10-CM | POA: Insufficient documentation

## 2022-05-12 DIAGNOSIS — N951 Menopausal and female climacteric states: Secondary | ICD-10-CM | POA: Insufficient documentation

## 2022-05-12 DIAGNOSIS — C50212 Malignant neoplasm of upper-inner quadrant of left female breast: Secondary | ICD-10-CM | POA: Diagnosis present

## 2022-05-12 DIAGNOSIS — Z9221 Personal history of antineoplastic chemotherapy: Secondary | ICD-10-CM | POA: Diagnosis not present

## 2022-05-12 DIAGNOSIS — F419 Anxiety disorder, unspecified: Secondary | ICD-10-CM | POA: Insufficient documentation

## 2022-05-12 DIAGNOSIS — Z8544 Personal history of malignant neoplasm of other female genital organs: Secondary | ICD-10-CM | POA: Diagnosis not present

## 2022-05-12 DIAGNOSIS — Z923 Personal history of irradiation: Secondary | ICD-10-CM | POA: Insufficient documentation

## 2022-05-12 MED ORDER — NICOTINE 14 MG/24HR TD PT24
14.0000 mg | MEDICATED_PATCH | Freq: Every day | TRANSDERMAL | 0 refills | Status: DC
  Filled 2022-05-12 – 2022-05-25 (×2): qty 28, 28d supply, fill #0

## 2022-05-12 NOTE — Assessment & Plan Note (Addendum)
Breast cancer and vulvar cancers  02/20/2020:PET scan on 02/07/20 following a diagnosis of vulvar cancer showed a 1.7cm left breast mass and mildly hypermetabolic left axillary lymph nodes. Mammogram and [place] on 02/18/20 showed a 3.1cm left breast mass at the 11 o'clock position, palpable on exam, and a single abnormal left axillary lymph node with cortical thickening. Left breast biopsy on 02/20/20 showed invasive and in situ carcinoma in the breast and axilla, grade 3, HER-2 positive (3+), ER/PR+ 95%, Ki67 75%  Treatment plan: 1.Neoadjuvant chemoradiation for vulvar cancer with Taxol, carboplatin, Herceptin weekly x6 cycles 2.neoadjuvant chemotherapy with TCH Perjeta x3 cycles 3.07/27/20:Left mastectomy Sandra Miller): three foci of IDC, 3.7cm, 1.5cm, and 1.2cm, with intermediate to high grade DCIS, clear margins, with 1/2 left axillary lymph nodes positive for carcinoma. 4. Followed by adjuvant radiation therapyto be completed on 10/23/20 5.Kadcyla maintenance completed 04/06/2021 6.Adjuvant antiestrogen therapywith anastrozole started 3/29/2022switched to letrozole 02/23/2021 7.Adjuvant neratinib ------------------------------------------------------------------------------------------------------------------- Current treatment:Letrozole   Letrozole toxicities: 1.Severe hot flashes   2.Depression: Sees psychiatry.  severe hyperthyroidism/Graves' disease.    She is struggling to get this under control  Left jugular DVT: Started on anticoagulation October 2021: Ultrasound 11/04/2021: Negative for DVT.  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  She is happy about it.  Tobacco cessation: She is now on the second month of NicoDerm patches and she will start taking the 14 mcg patch dose  Return to clinic in1 year for follow-up

## 2022-05-21 ENCOUNTER — Other Ambulatory Visit (HOSPITAL_COMMUNITY): Payer: Self-pay

## 2022-05-24 ENCOUNTER — Other Ambulatory Visit (HOSPITAL_COMMUNITY): Payer: Self-pay

## 2022-05-25 ENCOUNTER — Other Ambulatory Visit (HOSPITAL_COMMUNITY): Payer: Self-pay

## 2022-05-26 ENCOUNTER — Other Ambulatory Visit (HOSPITAL_COMMUNITY): Payer: Self-pay

## 2022-06-06 ENCOUNTER — Other Ambulatory Visit (HOSPITAL_COMMUNITY): Payer: Self-pay

## 2022-06-09 ENCOUNTER — Encounter (HOSPITAL_COMMUNITY): Payer: Self-pay | Admitting: Psychiatry

## 2022-06-09 ENCOUNTER — Telehealth (HOSPITAL_BASED_OUTPATIENT_CLINIC_OR_DEPARTMENT_OTHER): Payer: PRIVATE HEALTH INSURANCE | Admitting: Psychiatry

## 2022-06-09 ENCOUNTER — Other Ambulatory Visit (HOSPITAL_COMMUNITY): Payer: Self-pay

## 2022-06-09 DIAGNOSIS — F411 Generalized anxiety disorder: Secondary | ICD-10-CM

## 2022-06-09 DIAGNOSIS — F319 Bipolar disorder, unspecified: Secondary | ICD-10-CM

## 2022-06-09 MED ORDER — OLANZAPINE 15 MG PO TABS
15.0000 mg | ORAL_TABLET | Freq: Every day | ORAL | 2 refills | Status: DC
  Filled 2022-06-09 – 2022-07-07 (×3): qty 30, 30d supply, fill #0
  Filled 2022-08-02: qty 30, 30d supply, fill #1
  Filled 2022-09-06: qty 30, 30d supply, fill #2

## 2022-06-09 MED ORDER — FLUOXETINE HCL 10 MG PO CAPS
30.0000 mg | ORAL_CAPSULE | Freq: Every day | ORAL | 2 refills | Status: DC
  Filled 2022-06-09 – 2022-06-17 (×2): qty 90, 30d supply, fill #0
  Filled 2022-07-11 – 2022-07-25 (×2): qty 90, 30d supply, fill #1
  Filled 2022-08-21: qty 90, 30d supply, fill #2

## 2022-06-09 MED ORDER — LORAZEPAM 1 MG PO TABS
1.0000 mg | ORAL_TABLET | Freq: Three times a day (TID) | ORAL | 1 refills | Status: DC | PRN
  Filled 2022-06-09 – 2022-09-06 (×2): qty 90, 30d supply, fill #0

## 2022-06-09 NOTE — Progress Notes (Signed)
Virtual Visit via Telephone Note  I connected with [place] on 06/09/22 at 11:40 AM EDT by telephone and verified that I am speaking with the correct person using two identifiers.  Location: Patient: Home Provider: Home Office   I discussed the limitations, risks, security and privacy concerns of performing an evaluation and management service by telephone and the availability of in person appointments. I also discussed with the patient that there may be a patient responsible charge related to this service. The patient expressed understanding and agreed to proceed.   History of Present Illness: Patient is evaluated by phone session.  She is doing better and taking all her medication as prescribed.  Recently she had a visit with her cancer doctor and she was pleased that numbers are improved and they recommend to come back after 1 year.  She also had a visit to her other doctors and now taking less methimazole and propranolol was discontinued.  Patient is feeling better since her numbers are improved.  She has 1 panic attack but otherwise her anxiety is much better.  She is sleeping okay.  She denies any mood swings, anger, crying spells.  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  She has no tremors, shakes or any EPS.  She liked the Prozac.  She has good support from her family.  She denies any highs and lows or any impulsive behavior.  Past Psychiatric History:  H/O bipolar disorder, alcohol dependence and marijuana abuse.  H/O at least 6 inpatient. Tried lithium, Seroquel, Lamictal, Paxil, Celexa, Abilify, Geodon, Klonopin, Depakote, Neurontin and Cymbalta.  No H/o history of suicidal attempt.   Psychiatric Specialty Exam: Physical Exam  Review of Systems  Weight 200 lb (90.7 kg).There is no height or weight on file to calculate BMI.  General  Appearance: NA  Eye Contact:  NA  Speech:  Clear and Coherent  Volume:  Normal  Mood:  Euthymic  Affect:  NA  Thought Process:  Goal Directed  Orientation:  Full (Time, Place, and Person)  Thought Content:  Rumination  Suicidal Thoughts:  No  Homicidal Thoughts:  No  Memory:  Immediate;   Good Recent;   Good Remote;   Good  Judgement:  Intact  Insight:  Present  Psychomotor Activity:  NA  Concentration:  Concentration: Good and Attention Span: Good  Recall:  Good  Fund of Knowledge:  Good  Language:  Good  Akathisia:  No  Handed:  Right  AIMS (if indicated):     Assets:  Communication Skills Desire for Improvement Housing Resilience Social Support Talents/Skills Transportation  ADL's:  Intact  Cognition:  WNL  Sleep:   ok      Assessment and Plan: Bipolar disorder type I.  Generalized anxiety disorder.  Patient doing better and taking Prozac 30 mg.  She is pleased that her numbers are good and she does not have to see her cancer doctor for 1 year but she also had appointment coming up in November for her vulvar cancer visit.  Her Graves' disease are also better and her methimazole dose reduced.  She started new job working from home and she wants to try as she is still in 3 weeks training.  Discussed medication side effects and benefits.  I recommend to cut down her Ativan to take only  as needed up to 3 times a day.  Continue olanzapine 15 mg at bedtime and Prozac 30 mg daily.  Recommended to call us back if she has any question or any concern.  Follow-up in 3 months.  Follow Up Instructions:    I discussed the assessment and treatment plan with the patient. The patient was provided an opportunity to ask questions and all were answered. The patient agreed with the plan and demonstrated an understanding of the instructions.   The patient was advised to call back or seek an in-person evaluation if the symptoms worsen or if the condition fails to improve as  anticipated.  Collaboration of Care: Other provider involved in patient's care AEB notes are available in epic to review.  Patient/Guardian was advised Release of Information must be obtained prior to any record release in order to collaborate their care with an outside provider. Patient/Guardian was advised if they have not already done so to contact the registration department to sign all necessary forms in order for [place] to release information regarding their care.   Consent: Patient/Guardian gives verbal consent for treatment and assignment of benefits for services provided during this visit. Patient/Guardian expressed understanding and agreed to proceed.    I provided 20 minutes of non-face-to-face time during this encounter.   Kathlee Nations, MD

## 2022-06-17 ENCOUNTER — Ambulatory Visit
Admission: RE | Admit: 2022-06-17 | Discharge: 2022-06-17 | Disposition: A | Discharge status: Home / Self Care | Payer: Managed Care, Other (non HMO) | Source: Ambulatory Visit | Attending: Urgent Care

## 2022-06-17 ENCOUNTER — Encounter: Payer: Self-pay | Admitting: Hematology and Oncology

## 2022-06-17 ENCOUNTER — Other Ambulatory Visit (HOSPITAL_COMMUNITY): Payer: Self-pay

## 2022-06-17 ENCOUNTER — Other Ambulatory Visit: Payer: Self-pay | Admitting: Nurse Practitioner

## 2022-06-17 VITALS — BP 142/91 | HR 87 | Temp 99.2°F | Resp 16

## 2022-06-17 DIAGNOSIS — Z1152 Encounter for screening for COVID-19: Secondary | ICD-10-CM | POA: Diagnosis not present

## 2022-06-17 DIAGNOSIS — R070 Pain in throat: Secondary | ICD-10-CM | POA: Insufficient documentation

## 2022-06-17 DIAGNOSIS — B349 Viral infection, unspecified: Secondary | ICD-10-CM | POA: Diagnosis present

## 2022-06-17 DIAGNOSIS — F17200 Nicotine dependence, unspecified, uncomplicated: Secondary | ICD-10-CM | POA: Diagnosis present

## 2022-06-17 MED ORDER — PROMETHAZINE-DM 6.25-15 MG/5ML PO SYRP
2.5000 mL | ORAL_SOLUTION | Freq: Three times a day (TID) | ORAL | 0 refills | Status: DC | PRN
  Filled 2022-06-17: qty 100, 13d supply, fill #0

## 2022-06-17 MED ORDER — OMEPRAZOLE 40 MG PO CPDR
40.0000 mg | DELAYED_RELEASE_CAPSULE | Freq: Two times a day (BID) | ORAL | 3 refills | Status: DC
  Filled 2022-06-17: qty 60, 30d supply, fill #0
  Filled 2022-07-11 – 2022-07-25 (×2): qty 60, 30d supply, fill #1
  Filled 2022-08-21: qty 60, 30d supply, fill #2
  Filled 2022-09-20: qty 60, 30d supply, fill #3

## 2022-06-17 MED ORDER — PSEUDOEPHEDRINE HCL 60 MG PO TABS
60.0000 mg | ORAL_TABLET | Freq: Three times a day (TID) | ORAL | 0 refills | Status: DC | PRN
  Filled 2022-06-17: qty 30, 10d supply, fill #0

## 2022-06-17 MED ORDER — CETIRIZINE HCL 10 MG PO TABS
10.0000 mg | ORAL_TABLET | Freq: Every day | ORAL | 0 refills | Status: DC
  Filled 2022-06-17: qty 30, 30d supply, fill #0

## 2022-06-17 NOTE — ED Provider Notes (Signed)
[facility]  Note:  This document was prepared using Systems analyst and may include unintentional dictation errors.  MRN: [id number] DOB: 1976/06/19  Subjective:   Sandra Miller is a 46 y.o. female presenting for 2 to 3-day history of acute onset persistent throat pain, painful swallowing, sinus congestion, dry cough.  Patient has also had chills and body aches.  Would like to be checked for COVID and strep.  She has previously taken COVID antivirals.  No history of kidney disease but was not able to take Paxlovid.  Patient is still smoking but is working on quitting.  No history of respiratory disorders, asthma, COPD.  No current facility-administered medications for this encounter.  Current Outpatient Medications:    acetaminophen (TYLENOL) 500 MG tablet, Take 1 tablet (500 mg total) by mouth every 6 (six) hours as needed., Disp: 30 tablet, Rfl: 0   diclofenac Sodium (VOLTAREN) 1 % GEL, Apply 4 grams topically 4  times daily as needed., Disp: 500 g, Rfl: 6   doxylamine, Sleep, (UNISOM) 25 MG tablet, Take 25 mg by mouth at bedtime as needed for sleep., Disp: , Rfl:    FLUoxetine (PROZAC) 10 MG capsule, Take 3 capsules (30 mg total) by mouth daily., Disp: 90 capsule, Rfl: 2   gabapentin (NEURONTIN) 300 MG capsule, Take 1 capsule (300 mg total) by mouth daily., Disp: 30 capsule, Rfl: 5   letrozole (FEMARA) 2.5 MG tablet, Take 1 tablet (2.5 mg total) by mouth daily., Disp: 90 tablet, Rfl: 3   letrozole (FEMARA) 2.5 MG tablet, Take 1 tablet (2.5 mg total) by mouth daily., Disp: 90 tablet, Rfl: 3   lisinopril (ZESTRIL) 20 MG tablet, TAKE 1/2 TABLET BY MOUTH ONCE A DAY, Disp: 90 tablet, Rfl: 3   loratadine (CLARITIN) 10 MG tablet, Take 10 mg by mouth daily., Disp: , Rfl:    LORazepam (ATIVAN) 1 MG tablet, Take 1 tablet (1 mg total) by mouth 3 (three) times daily as needed for anxiety, Disp: 90 tablet, Rfl: 1   methimazole (TAPAZOLE) 10 MG tablet, Take  1 tablet (10 mg total) by mouth 2 (two) times daily. (Patient not taking: Reported on 06/09/2022), Disp: 60 tablet, Rfl: 4   methimazole (TAPAZOLE) 5 MG tablet, Take 1 tablet (5 mg total) by mouth daily., Disp: 90 tablet, Rfl: 1   Multiple Vitamin (MULTIVITAMIN WITH MINERALS) TABS tablet, Take 1 tablet by mouth daily., Disp: , Rfl:    nicotine (NICODERM CQ) 14 mg/24hr patch, Place 1 patch (14 mg total) onto the skin daily., Disp: 28 patch, Rfl: 0   OLANZapine (ZYPREXA) 15 MG tablet, Take 1 tablet (15 mg total) by mouth at bedtime., Disp: 30 tablet, Rfl: 2   omeprazole (PRILOSEC) 40 MG capsule, TAKE 1 CAPSULE BY MOUTH EVERY MORNING AND EVERY NIGHT AT BEDTIME, Disp: 60 capsule, Rfl: 3   ondansetron (ZOFRAN-ODT) 4 MG disintegrating tablet, Take 1 tablet (4 mg total) by mouth every 8 (eight) hours as needed for nausea or vomiting., Disp: 10 tablet, Rfl: 0   promethazine (PHENERGAN) 25 MG tablet, TAKE 1 TABLET BY MOUTH EVERY 6 HOURS AS NEEDED FOR NAUSEA, Disp: 30 tablet, Rfl: 6   propranolol (INDERAL) 10 MG tablet, Take 1 tablet by mouth 2 (two) times daily. (Patient not taking: Reported on 06/09/2022), Disp: 180 tablet, Rfl: 1   rosuvastatin (CRESTOR) 10 MG tablet, TAKE 1 TABLET BY MOUTH ONCE A DAY, Disp: 90 tablet, Rfl: 3   senna-docusate (SENOKOT-S) 8.6-50 MG tablet, Take  2 tablets by mouth at bedtime. For AFTER surgery only, do not take if having diarrhea, Disp: 30 tablet, Rfl: 0   Allergies  Allergen Reactions   Dilaudid [Hydromorphone Hcl] Other (See Comments)    Pt became confused, pulled out iv's, does not remember anything   Depakote [Divalproex Sodium] Nausea And Vomiting   Minocycline Hives   Aspirin Hives    States able to tolerate Goody Powders without any problem except for GI upset    Past Medical History:  Diagnosis Date   Arthritis    Bipolar 1 disorder ([place])    Cancer ([place])    Complication of anesthesia    wakes up during procedures   Depression    GAD (generalized anxiety  disorder)    Genital HSV    currently per pt  no break out 03-22-2016    GERD (gastroesophageal reflux disease)    Hiatal hernia    History of cervical dysplasia    2012 laser ablation   History of esophageal dilatation    for dysphasia -- x2 dilated   History of gastric ulcer    History of Helicobacter pylori infection    remote hx   History of hidradenitis suppurativa    "gets all over body intermittantly"     History of hypertension    no issue since stopped drinking alcohol 2014   History of kidney stones    History of panic attacks    History of radiation therapy 03/16/2020-05/08/2020   vulva  Dr Gery Pray   History of radiation therapy 09/09/2020-10/23/2020   left chest wall/left SCV   Dr Gery Pray   Hypertension    Iron deficiency anemia    Left ureteral stone    OCD (obsessive compulsive disorder)    PONV (postoperative nausea and vomiting)    Pre-diabetes    PTSD (post-traumatic stress disorder)    Recovering alcoholic in remission ([place])    since 2014   RLS (restless legs syndrome)    Smokers' cough ([facility])    Thyroid disease    Urgency of urination    Yeast infection involving the vagina and surrounding area    secondary to taking antibiotic     Past Surgical History:  Procedure Laterality Date   CESAREAN SECTION  1995   w/  Bilateral Tubal Ligation   COLONOSCOPY  last one 08-09-2013   CYSTOSCOPY W/ URETERAL STENT PLACEMENT Left 03/29/2016   Procedure: CYSTOSCOPY WITH STENT REPLACEMENT;  Surgeon: Nickie Retort, MD;  Location: [facility];  Service: Urology;  Laterality: Left;   CYSTOSCOPY WITH RETROGRADE PYELOGRAM, URETEROSCOPY AND STENT PLACEMENT Left 03/08/2016   Procedure: CYSTOSCOPY WITH  LEFT RETROGRADE PYELOGRAM, AND STENT PLACEMENT;  Surgeon: Nickie Retort, MD;  Location: [facility];  Service: Urology;  Laterality: Left;   CYSTOSCOPY/RETROGRADE/URETEROSCOPY/STONE EXTRACTION WITH BASKET Left 03/29/2016   Procedure:  CYSTOSCOPY/RETROGRADE/URETEROSCOPY/STONE EXTRACTION WITH BASKET;  Surgeon: Nickie Retort, MD;  Location[facility];  Service: Urology;  Laterality: Left;   ENDOMETRIAL ABLATION W/ NOVASURE  04-01-2010   ESOPHAGOGASTRODUODENOSCOPY  last one 08-09-2013   KNEE ARTHROSCOPY Left as teen   LASER ABLATION OF THE CERVIX  2012 approx   MASTECTOMY WITH AXILLARY LYMPH NODE DISSECTION Left 07/27/2020   Procedure: LEFT MASTECTOMY WITH LEFT RADIOACTIVE SEED GUIDED TARGETED AXILLARY LYMPH NODE DISSECTION;  Surgeon: Alphonsa Overall, MD;  Location: [facility];  Service: General;  Laterality: Left;   PORT-A-CATH REMOVAL Right 05/18/2021   Procedure: REMOVAL PORT-A-CATH;  Surgeon: Donnie Mesa,  MD;  Location: [facility];  Service: General;  Laterality: Right;   PORTACATH PLACEMENT Right 03/06/2020   Procedure: INSERTION PORT-A-CATH WITH ULTRASOUND GUIDANCE;  Surgeon: Alphonsa Overall, MD;  Location: [facility];  Service: General;  Laterality: Right;   ROBOTIC ASSISTED TOTAL HYSTERECTOMY WITH BILATERAL SALPINGO OOPHERECTOMY N/A 05/18/2021   Procedure: XI ROBOTIC ASSISTED TOTAL HYSTERECTOMY WITH BILATERAL SALPINGO OOPHORECTOMY;  Surgeon: Everitt Amber, MD;  Location: [facility];  Service: Gynecology;  Laterality: N/A;   TRANSTHORACIC ECHOCARDIOGRAM  05-19-2006   lvsf normal, ef 55-65%, there was mild flattening of the interventricular septum during diastoli/  RV size at upper limits normal   TUBAL LIGATION     VULVA /PERINEUM BIOPSY N/A 05/18/2021   Procedure: VULVAR BIOPSY;  Surgeon: Everitt Amber, MD;  Location: [facility];  Service: Gynecology;  Laterality: N/A;   WISDOM TOOTH EXTRACTION  age 89 's    Family History  Problem Relation Age of Onset   Heart disease Father    Lung cancer Father        d. 49   Alcohol abuse Father    Heart disease Mother    Depression Mother    Anxiety disorder Mother    Drug abuse Brother    Alcohol abuse Brother    Drug abuse Brother    ADD / ADHD Brother     Colon polyps Brother    Cancer Paternal Grandfather        "stomach"   Diabetes Maternal Grandfather    Diabetes Paternal Grandmother    Kidney disease Maternal Uncle    Cirrhosis Cousin        alcoholic   Anxiety disorder Maternal Aunt    Depression Maternal Aunt    Cancer Cousin        maternal; ovarian cancer or other "female" cancer?   Lung cancer Paternal Uncle 84   Throat cancer Cousin        paternal; dx 34s   Lung cancer Cousin        paternal; dx 42s    Social History   Tobacco Use   Smoking status: Every Day    Packs/day: 0.50    Years: 22.00    Total pack years: 11.00    Types: Cigarettes   Smokeless tobacco: Never   Tobacco comments:    Recently started a smoking cessation class.   Vaping Use   Vaping Use: Never used  Substance Use Topics   Alcohol use: Not Currently    Alcohol/week: 0.0 standard drinks of alcohol    Comment:  hx alcohollism  in remission since 2014   Drug use: Yes    Types: Marijuana    Comment: occas.    ROS   Objective:   Vitals: BP (!) 142/91 (BP Location: Right Arm)   Pulse 87   Temp 99.2 F (37.3 C) (Oral)   Resp 16   SpO2 97%   Physical Exam Constitutional:      General: She is not in acute distress.    Appearance: Normal appearance. She is well-developed and normal weight. She is not ill-appearing, toxic-appearing or diaphoretic.  HENT:     Head: Normocephalic and atraumatic.     Right Ear: Tympanic membrane, ear canal and external ear normal. No drainage or tenderness. No middle ear effusion. There is no impacted cerumen. Tympanic membrane is not erythematous.     Left Ear: Tympanic membrane, ear canal and external ear normal. No drainage or tenderness.  No middle ear effusion. There  is no impacted cerumen. Tympanic membrane is not erythematous.     Nose: Congestion present. No rhinorrhea.     Mouth/Throat:     Mouth: Mucous membranes are moist. No oral lesions.     Pharynx: Posterior oropharyngeal erythema (with  associated postnasal drainage overlying pharynx) present. No pharyngeal swelling, oropharyngeal exudate or uvula swelling.     Tonsils: No tonsillar exudate or tonsillar abscesses.  Eyes:     General: No scleral icterus.       Right eye: No discharge.        Left eye: No discharge.     Extraocular Movements: Extraocular movements intact.     Right eye: Normal extraocular motion.     Left eye: Normal extraocular motion.     Conjunctiva/sclera: Conjunctivae normal.  Cardiovascular:     Rate and Rhythm: Normal rate and regular rhythm.     Heart sounds: Normal heart sounds. No murmur heard.    No friction rub. No gallop.  Pulmonary:     Effort: Pulmonary effort is normal. No respiratory distress.     Breath sounds: No stridor. No wheezing, rhonchi or rales.  Chest:     Chest wall: No tenderness.  Musculoskeletal:     Cervical back: Normal range of motion and neck supple.  Lymphadenopathy:     Cervical: No cervical adenopathy.  Skin:    General: Skin is warm and dry.  Neurological:     General: No focal deficit present.     Mental Status: She is alert and oriented to person, place, and time.  Psychiatric:        Mood and Affect: Mood normal.        Behavior: Behavior normal.     Rapid strep test was negative but result did not crossover.  Assessment and Plan :   PDMP not reviewed this encounter.  1. Acute viral syndrome   2. Throat pain   3. Smoker     Deferred imaging given clear cardiopulmonary exam, hemodynamically stable vital signs.  Will defer prednisone for now despite her smoking due to clear lung sounds, no shortness of breath or wheezing. Will manage for viral illness such as viral URI, viral syndrome, viral rhinitis, COVID-19, viral pharyngitis. Recommended supportive care. Offered scripts for symptomatic relief. COVID 19 and strep culture are pending.  Patient would be a good candidate to take molnupiravir should she test positive.  Counseled patient on potential  for adverse effects with medications prescribed/recommended today, ER and return-to-clinic precautions discussed, patient verbalized understanding.     Jaynee Eagles, PA-C 06/17/22 1301

## 2022-06-17 NOTE — Discharge Instructions (Addendum)
We will notify you of your test results as they arrive and may take between about 24 hours.  I encourage you to sign up for MyChart if you have not already done so as this can be the easiest way for us to communicate results to you online or through a phone app.  Generally, we only contact you if it is a positive test result.  In the meantime, if you develop worsening symptoms including fever, chest pain, shortness of breath despite our current treatment plan then please report to the emergency room as this may be a sign of worsening status from possible viral infection.  Otherwise, we will manage this as a viral syndrome. For sore throat or cough try using a honey-based tea. Use 3 teaspoons of honey with juice squeezed from half lemon. Place shaved pieces of ginger into 1/2-1 cup of water and warm over stove top. Then mix the ingredients and repeat every 4 hours as needed. Please take Tylenol 500mg-650mg every 6 hours for aches and pains, fevers. Hydrate very well with at least 2 liters of water. Eat light meals such as soups to replenish electrolytes and soft fruits, veggies. Start an antihistamine like Zyrtec for postnasal drainage, sinus congestion.  You can take this together with pseudoephedrine (Sudafed) at a dose of 60 mg 2-3 times a day as needed for the same kind of congestion.  Use the cough medications as needed.   

## 2022-06-17 NOTE — ED Triage Notes (Signed)
Pt states sore throat, chills,and body aches for the past 2 days.

## 2022-06-18 LAB — SARS CORONAVIRUS 2 (TAT 6-24 HRS): SARS Coronavirus 2: NEGATIVE

## 2022-06-20 ENCOUNTER — Other Ambulatory Visit (HOSPITAL_COMMUNITY): Payer: Self-pay

## 2022-06-20 LAB — CULTURE, GROUP A STREP (THRC)

## 2022-06-24 ENCOUNTER — Telehealth: Payer: Self-pay

## 2022-06-24 ENCOUNTER — Ambulatory Visit
Admission: RE | Admit: 2022-06-24 | Discharge: 2022-06-24 | Disposition: A | Discharge status: Home / Self Care | Payer: Managed Care, Other (non HMO) | Source: Ambulatory Visit | Attending: Emergency Medicine | Admitting: Emergency Medicine

## 2022-06-24 VITALS — BP 180/119 | HR 122 | Temp 99.1°F | Resp 18

## 2022-06-24 DIAGNOSIS — G44201 Tension-type headache, unspecified, intractable: Secondary | ICD-10-CM

## 2022-06-24 DIAGNOSIS — I10 Essential (primary) hypertension: Secondary | ICD-10-CM | POA: Diagnosis not present

## 2022-06-24 HISTORY — DX: Thyrotoxicosis with diffuse goiter without thyrotoxic crisis or storm: E05.00

## 2022-06-24 MED ORDER — IBUPROFEN 600 MG PO TABS: 600.0000 mg | ORAL_TABLET | Freq: Three times a day (TID) | ORAL | 0 refills | Status: DC | PRN

## 2022-06-24 MED ORDER — CLONIDINE HCL 0.2 MG PO TABS
0.2000 mg | ORAL_TABLET | Freq: Once | ORAL | Status: AC
  Administered 2022-06-24: 0.2 mg via ORAL

## 2022-06-24 MED ORDER — IBUPROFEN 600 MG PO TABS
600.0000 mg | ORAL_TABLET | Freq: Three times a day (TID) | ORAL | 0 refills | Status: DC | PRN
  Filled 2022-06-24: qty 30, 10d supply, fill #0

## 2022-06-24 MED ORDER — BACLOFEN 10 MG PO TABS: 10.0000 mg | ORAL_TABLET | Freq: Every day | ORAL | 0 refills | Status: AC

## 2022-06-24 MED ORDER — BACLOFEN 10 MG PO TABS
10.0000 mg | ORAL_TABLET | Freq: Every day | ORAL | 0 refills | Status: DC
  Filled 2022-06-24: qty 7, 7d supply, fill #0

## 2022-06-24 MED ORDER — KETOROLAC TROMETHAMINE 60 MG/2ML IM SOLN
60.0000 mg | Freq: Once | INTRAMUSCULAR | Status: AC
  Administered 2022-06-24: 60 mg via INTRAMUSCULAR

## 2022-06-24 NOTE — ED Provider Notes (Signed)
[facility]    CSN: [id number] Arrival date & time: 06/24/22  1723    HISTORY   Chief Complaint  Patient presents with   Migraine    Migraine for 3 days, nausea - Entered by patient   HPI Sandra Miller is a pleasant, 46 y.o. female who presents to urgent care today. Patient complains of a 3-day history of intense headache with light sensitivity.  Patient states that headache is global but began at the base of her head just above her neck and has since spread to her entire head, denies more pain on one side of her head than the other.  Patient denies vision changes, shortness of breath, chest pain.  Patient states she has been taking Tylenol, last dose at 4:30 PM today.  Patient states she also took lorazepam and a dose of her mother's promethazine around 5 PM today.  Patient states she smoked a cigarette prior to arrival; blood pressure and heart rate are significantly elevated on arrival today.  The history is provided by the patient.   Past Medical History:  Diagnosis Date   Arthritis    Bipolar 1 disorder ([place])    Cancer ([place])    Complication of anesthesia    wakes up during procedures   Depression    GAD (generalized anxiety disorder)    Genital HSV    currently per pt  no break out 03-22-2016    GERD (gastroesophageal reflux disease)    Graves disease    Hiatal hernia    History of cervical dysplasia    2012 laser ablation   History of esophageal dilatation    for dysphasia -- x2 dilated   History of gastric ulcer    History of Helicobacter pylori infection    remote hx   History of hidradenitis suppurativa    "gets all over body intermittantly"     History of hypertension    no issue since stopped drinking alcohol 2014   History of kidney stones    History of panic attacks    History of radiation therapy 03/16/2020-05/08/2020   vulva  Dr Gery Pray   History of radiation therapy 09/09/2020-10/23/2020   left chest wall/left SCV   Dr Gery Pray    Hypertension    Iron deficiency anemia    Left ureteral stone    OCD (obsessive compulsive disorder)    PONV (postoperative nausea and vomiting)    Pre-diabetes    PTSD (post-traumatic stress disorder)    Recovering alcoholic in remission ([name])    since 2014   RLS (restless legs syndrome)    Smokers' cough ([place])    Thyroid disease    Urgency of urination    Yeast infection involving the vagina and surrounding area    secondary to taking antibiotic   Patient Active Problem List   Diagnosis Date Noted   Sebaceous cyst 12/06/2021   Pain in left knee 11/03/2021   Hyperthyroidism 08/13/2021   Bipolar 1 disorder ([name]) 05/07/2021   Obesity (BMI 30.0-34.9) 05/07/2021   Hyperlipidemia 08/12/2020   Prediabetes 08/12/2020   Hypomagnesemia 08/12/2020   Mutation in BRIP1 gene 06/24/2020   Hypokalemia 06/17/2020   Dehydration    Colitis 06/16/2020   Port-A-Cath in place 03/11/2020   Malignant neoplasm of upper-inner quadrant of left breast in female, estrogen receptor positive ([place]) 02/25/2020   Vulvar cancer ([name]) 01/29/2020   Vulvar ulceration 01/23/2020   Essential hypertension 10/31/2019   Nausea and vomiting 12/27/2018   Seasonal allergic  rhinitis 12/27/2018   History of ELISA positive for HSV 10/01/2018   Restless leg syndrome 04/01/2016   Nephrolithiasis 03/08/2016   Obstructive pyelonephritis 03/08/2016   Normocytic anemia 03/08/2016   Hidradenitis suppurativa of left axilla 11/03/2015   Loss of weight 09/08/2015   Neck pain on left side 10/22/2014   Genital warts 05/20/2014   GERD (gastroesophageal reflux disease) 12/26/2012   Functional dyspepsia 12/26/2012   Tobacco abuse 08/22/2012   Herpes simplex virus (HSV) infection 02/03/2007   Past Surgical History:  Procedure Laterality Date   CESAREAN SECTION  1995   w/  Bilateral Tubal Ligation   COLONOSCOPY  last one 08-09-2013   CYSTOSCOPY W/ URETERAL STENT PLACEMENT Left 03/29/2016   Procedure: CYSTOSCOPY WITH STENT  REPLACEMENT;  Surgeon: Nickie Retort, MD;  Location: [facility];  Service: Urology;  Laterality: Left;   CYSTOSCOPY WITH RETROGRADE PYELOGRAM, URETEROSCOPY AND STENT PLACEMENT Left 03/08/2016   Procedure: CYSTOSCOPY WITH  LEFT RETROGRADE PYELOGRAM, AND STENT PLACEMENT;  Surgeon: Nickie Retort, MD;  Location: [facility];  Service: Urology;  Laterality: Left;   CYSTOSCOPY/RETROGRADE/URETEROSCOPY/STONE EXTRACTION WITH BASKET Left 03/29/2016   Procedure: CYSTOSCOPY/RETROGRADE/URETEROSCOPY/STONE EXTRACTION WITH BASKET;  Surgeon: Nickie Retort, MD;  Location: [facility];  Service: Urology;  Laterality: Left;   ENDOMETRIAL ABLATION W/ NOVASURE  04-01-2010   ESOPHAGOGASTRODUODENOSCOPY  last one 08-09-2013   KNEE ARTHROSCOPY Left as teen   LASER ABLATION OF THE CERVIX  2012 approx   MASTECTOMY WITH AXILLARY LYMPH NODE DISSECTION Left 07/27/2020   Procedure: LEFT MASTECTOMY WITH LEFT RADIOACTIVE SEED GUIDED TARGETED AXILLARY LYMPH NODE DISSECTION;  Surgeon: Alphonsa Overall, MD;  Location: [facility];  Service: General;  Laterality: Left;   PORT-A-CATH REMOVAL Right 05/18/2021   Procedure: REMOVAL PORT-A-CATH;  Surgeon: Donnie Mesa, MD;  Location: [facility];  Service: General;  Laterality: Right;   PORTACATH PLACEMENT Right 03/06/2020   Procedure: INSERTION PORT-A-CATH WITH ULTRASOUND GUIDANCE;  Surgeon: Alphonsa Overall, MD;  Location: [place];  Service: General;  Laterality: Right;   ROBOTIC ASSISTED TOTAL HYSTERECTOMY WITH BILATERAL SALPINGO OOPHERECTOMY N/A 05/18/2021   Procedure: XI ROBOTIC ASSISTED TOTAL HYSTERECTOMY WITH BILATERAL SALPINGO OOPHORECTOMY;  Surgeon: Everitt Amber, MD;  Location: [facility];  Service: Gynecology;  Laterality: N/A;   TRANSTHORACIC ECHOCARDIOGRAM  05-19-2006   lvsf normal, ef 55-65%, there was mild flattening of the interventricular septum during diastoli/  RV size at upper limits normal   TUBAL LIGATION     VULVA /PERINEUM BIOPSY N/A  05/18/2021   Procedure: VULVAR BIOPSY;  Surgeon: Everitt Amber, MD;  Location: [facility];  Service: Gynecology;  Laterality: N/A;   WISDOM TOOTH EXTRACTION  age 42 's   OB History     Gravida  2   Para  2   Term  2   Preterm      AB      Living  2      SAB      IAB      Ectopic      Multiple      Live Births  2          Home Medications    Prior to Admission medications   Medication Sig Start Date End Date Taking? Authorizing Provider  acetaminophen (TYLENOL) 500 MG tablet Take 1 tablet (500 mg total) by mouth every 6 (six) hours as needed. 01/02/22   Talbot Grumbling, FNP  cetirizine (ZYRTEC ALLERGY) 10 MG tablet Take 1 tablet (10 mg total)  by mouth daily. 06/17/22   Jaynee Eagles, PA-C  diclofenac Sodium (VOLTAREN) 1 % GEL Apply 4 grams topically 4  times daily as needed. 03/30/21   Hilts, Legrand Como, MD  doxylamine, Sleep, (UNISOM) 25 MG tablet Take 25 mg by mouth at bedtime as needed for sleep.    [provider]  FLUoxetine (PROZAC) 10 MG capsule Take 3 capsules (30 mg total) by mouth daily. 06/09/22   Arfeen, Arlyce Harman, MD  gabapentin (NEURONTIN) 300 MG capsule Take 1 capsule (300 mg total) by mouth daily. 01/12/22   Willia Craze, NP  letrozole [facility]) 2.5 MG tablet Take 1 tablet (2.5 mg total) by mouth daily. 10/28/21   Nicholas Lose, MD  letrozole [facility]) 2.5 MG tablet Take 1 tablet (2.5 mg total) by mouth daily. 10/29/21   Nicholas Lose, MD  lisinopril (ZESTRIL) 20 MG tablet TAKE 1/2 TABLET BY MOUTH ONCE A DAY 12/06/21 12/06/22  Ladell Pier, MD  loratadine (CLARITIN) 10 MG tablet Take 10 mg by mouth daily.    [provider]  LORazepam (ATIVAN) 1 MG tablet Take 1 tablet (1 mg total) by mouth 3 (three) times daily as needed for anxiety 06/09/22   Arfeen, Arlyce Harman, MD  methimazole (TAPAZOLE) 10 MG tablet Take 1 tablet (10 mg total) by mouth 2 (two) times daily. Patient not taking: Reported on 06/09/2022 12/06/21   Ladell Pier, MD  methimazole  (TAPAZOLE) 5 MG tablet Take 1 tablet (5 mg total) by mouth daily. 05/10/22     Multiple Vitamin (MULTIVITAMIN WITH MINERALS) TABS tablet Take 1 tablet by mouth daily.    [provider]  nicotine (NICODERM CQ) 14 mg/24hr patch Place 1 patch (14 mg total) onto the skin daily. 05/12/22   Nicholas Lose, MD  OLANZapine (ZYPREXA) 15 MG tablet Take 1 tablet (15 mg total) by mouth at bedtime. 06/09/22   Arfeen, Arlyce Harman, MD  omeprazole (PRILOSEC) 40 MG capsule Take 1 capsule (40 mg total) by mouth in the morning and at bedtime. 06/17/22   Willia Craze, NP  ondansetron (ZOFRAN-ODT) 4 MG disintegrating tablet Take 1 tablet (4 mg total) by mouth every 8 (eight) hours as needed for nausea or vomiting. 02/11/22   Sherwood Gambler, MD  promethazine (PHENERGAN) 25 MG tablet TAKE 1 TABLET BY MOUTH EVERY 6 HOURS AS NEEDED FOR NAUSEA 07/26/21   Nicholas Lose, MD  promethazine-dextromethorphan (PROMETHAZINE-DM) 6.25-15 MG/5ML syrup Take 2.5 mLs by mouth 3 (three) times daily as needed for cough. 06/17/22   Jaynee Eagles, PA-C  propranolol (INDERAL) 10 MG tablet Take 1 tablet by mouth 2 (two) times daily. Patient not taking: Reported on 06/09/2022 12/06/21   Ladell Pier, MD  pseudoephedrine (SUDAFED) 60 MG tablet Take 1 tablet (60 mg total) by mouth every 8 (eight) hours as needed for congestion. 06/17/22   Jaynee Eagles, PA-C  rosuvastatin (CRESTOR) 10 MG tablet TAKE 1 TABLET BY MOUTH ONCE A DAY 12/06/21 12/06/22  Ladell Pier, MD  senna-docusate (SENOKOT-S) 8.6-50 MG tablet Take 2 tablets by mouth at bedtime. For AFTER surgery only, do not take if having diarrhea 02/19/21   Cross, Lenna Sciara D, NP  prochlorperazine (COMPAZINE) 10 MG tablet TAKE 1 TABLET BY MOUTH EVERY 6 HOURS AS NEEDED FOR NAUSEA OR VOMITING Patient taking differently: Take 10 mg by mouth every 6 (six) hours as needed for nausea or vomiting.  06/16/20 08/18/20  Nicholas Lose, MD    Family History Family History  Problem Relation Age of Onset  Heart disease Father    Lung cancer Father        d. 36   Alcohol abuse Father    Heart disease Mother    Depression Mother    Anxiety disorder Mother    Drug abuse Brother    Alcohol abuse Brother    Drug abuse Brother    ADD / ADHD Brother    Colon polyps Brother    Cancer Paternal Grandfather        "stomach"   Diabetes Maternal Grandfather    Diabetes Paternal Grandmother    Kidney disease Maternal Uncle    Cirrhosis Cousin        alcoholic   Anxiety disorder Maternal Aunt    Depression Maternal Aunt    Cancer Cousin        maternal; ovarian cancer or other "female" cancer?   Lung cancer Paternal Uncle 26   Throat cancer Cousin        paternal; dx 38s   Lung cancer Cousin        paternal; dx 2s   Social History Social History   Tobacco Use   Smoking status: Every Day    Packs/day: 0.50    Years: 22.00    Total pack years: 11.00    Types: Cigarettes   Smokeless tobacco: Never   Tobacco comments:    Recently started a smoking cessation class.   Vaping Use   Vaping Use: Never used  Substance Use Topics   Alcohol use: Not Currently    Alcohol/week: 0.0 standard drinks of alcohol    Comment:  hx alcohollism  in remission since 2014   Drug use: Yes    Types: Marijuana    Comment: occas.   Allergies   Dilaudid [hydromorphone hcl], Aspirin, Depakote [divalproex sodium], and Minocycline  Review of Systems Review of Systems Pertinent findings revealed after performing a 14 point review of systems has been noted in the history of present illness.  Physical Exam Triage Vital Signs ED Triage Vitals  Enc Vitals Group     BP 07/02/21 0827 (!) 147/82     Pulse Rate 07/02/21 0827 72     Resp 07/02/21 0827 18     Temp 07/02/21 0827 98.3 F (36.8 C)     Temp Source 07/02/21 0827 Oral     SpO2 07/02/21 0827 98 %     Weight --      Height --      Head Circumference --      Peak Flow --      Pain Score 07/02/21 0826 5     Pain Loc --      Pain Edu? --       Excl. in [place]? --   No data found.  Updated Vital Signs BP (!) 186/122 (BP Location: Right Arm) Comment: provider made aware  Pulse (!) 122 Comment: provider made aware  Temp 99.1 F (37.3 C) (Oral)   Resp 18   SpO2 96%   Physical Exam Vitals and nursing note reviewed.  Constitutional:      General: She is not in acute distress.    Appearance: Normal appearance.  HENT:     Head: Normocephalic and atraumatic.  Eyes:     Pupils: Pupils are equal, round, and reactive to light.  Cardiovascular:     Rate and Rhythm: Normal rate and regular rhythm.  Pulmonary:     Effort: Pulmonary effort is normal.     Breath sounds: Normal breath sounds.  Musculoskeletal:        General: Normal range of motion.     Cervical back: Normal range of motion and neck supple.  Skin:    General: Skin is warm and dry.  Neurological:     General: No focal deficit present.     Mental Status: She is alert and oriented to person, place, and time. Mental status is at baseline.     Cranial Nerves: Cranial nerves 2-12 are intact.     Sensory: Sensation is intact.     Motor: Motor function is intact.     Coordination: Coordination is intact.     Gait: Gait is intact.  Psychiatric:        Mood and Affect: Mood normal.        Behavior: Behavior normal.        Thought Content: Thought content normal.        Judgment: Judgment normal.     Visual Acuity Right Eye Distance:   Left Eye Distance:   Bilateral Distance:    Right Eye Near:   Left Eye Near:    Bilateral Near:     UC Couse / Diagnostics / Procedures:     Radiology No results found.  Procedures Procedures (including critical care time) EKG  Pending results:  Labs Reviewed - No data to display  Medications Ordered in UC: Medications  ketorolac (TORADOL) injection 60 mg (60 mg Intramuscular Given 06/24/22 1741)  cloNIDine (CATAPRES) tablet 0.2 mg (0.2 mg Oral Given 06/24/22 1741)    UC Diagnoses / Final Clinical Impressions(s)   I  have reviewed the triage vital signs and the nursing notes.  Pertinent labs & imaging results that were available during my care of the patient were reviewed by me and considered in my medical decision making (see chart for details).    Final diagnoses:  Acute intractable tension-type headache  Essential hypertension   Patient provided with an injection of ketorolac to relieve pain.  Patient provided with clonidine 0.2 mg to bring blood pressure down.  Patient advised to strongly consider quitting smoking as this is likely cause of her significantly elevated blood pressure at this time.  Patient provided with a prescription for baclofen and continued ibuprofen Aleve tension headache.  Return precautions advised.  ED Prescriptions     Medication Sig Dispense Auth. Provider   baclofen (LIORESAL) 10 MG tablet Take 1 tablet (10 mg total) by mouth at bedtime for 7 days. 7 tablet Lynden Oxford Scales, PA-C   ibuprofen (ADVIL) 600 MG tablet Take 1 tablet (600 mg total) by mouth every 8 (eight) hours as needed for up to 30 doses for fever, headache, mild pain or moderate pain (Inflammation). Take 1 tablet 3 times daily as needed for inflammation of upper airways and/or pain. 30 tablet Lynden Oxford Scales, PA-C      PDMP not reviewed this encounter.  Pending results:  Labs Reviewed - No data to display  Discharge Instructions:   Discharge Instructions      Today you received an injection of ketorolac to quickly relieve your headache pain.  We also provided you with a dose of clonidine to help bring your blood pressure down.  Please be advised that smoking cigarettes can significantly elevate your blood pressure.  I have sent prescriptions for ibuprofen and baclofen to your pharmacy.  Ibuprofen for pain and baclofen to relieve muscle tension which is a likely source of your persistent headache at this time.  Please let us know if  you are not feeling any better.  Thank you for visiting  urgent care today.      Disposition Upon Discharge:  Condition: stable for discharge home  Patient presented with an acute illness with associated systemic symptoms and significant discomfort requiring urgent management. In my opinion, this is a condition that a prudent lay person (someone who possesses an average knowledge of health and medicine) may potentially expect to result in complications if not addressed urgently such as respiratory distress, impairment of bodily function or dysfunction of bodily organs.   Routine symptom specific, illness specific and/or disease specific instructions were discussed with the patient and/or caregiver at length.   As such, the patient has been evaluated and assessed, work-up was performed and treatment was provided in alignment with urgent care protocols and evidence based medicine.  Patient/parent/caregiver has been advised that the patient may require follow up for further testing and treatment if the symptoms continue in spite of treatment, as clinically indicated and appropriate.  Patient/parent/caregiver has been advised to return to the [facility] or PCP if no better; to PCP or the Emergency Department if new signs and symptoms develop, or if the current signs or symptoms continue to change or worsen for further workup, evaluation and treatment as clinically indicated and appropriate  The patient will follow up with their current PCP if and as advised. If the patient does not currently have a PCP we will assist them in obtaining one.   The patient may need specialty follow up if the symptoms continue, in spite of conservative treatment and management, for further workup, evaluation, consultation and treatment as clinically indicated and appropriate.   Patient/parent/caregiver verbalized understanding and agreement of plan as discussed.  All questions were addressed during visit.  Please see discharge instructions below for further details of plan.  This  office note has been dictated using Museum/gallery curator.  Unfortunately, this method of dictation can sometimes lead to typographical or grammatical errors.  I apologize for your inconvenience in advance if this occurs.  Please do not hesitate to reach out to me if clarification is needed.      Lynden Oxford Scales, [place] 06/25/22 [phone]

## 2022-06-24 NOTE — Discharge Instructions (Signed)
Today you received an injection of ketorolac to quickly relieve your headache pain.  We also provided you with a dose of clonidine to help bring your blood pressure down.  Please be advised that smoking cigarettes can significantly elevate your blood pressure.  I have sent prescriptions for ibuprofen and baclofen to your pharmacy.  Ibuprofen for pain and baclofen to relieve muscle tension which is a likely source of your persistent headache at this time.  Please let us know if you are not feeling any better.  Thank you for visiting urgent care today.

## 2022-06-24 NOTE — ED Triage Notes (Signed)
Pt c/o migraine x 3 days, the patient states she is having light sensitivity.   Home interventions: tylenol (last taken around 4:30 pm), lorazepam

## 2022-06-24 NOTE — ED Notes (Addendum)
Pt educated on HTN and to take her prescribed BP medication tonight (per provider). Patient educated to call her PCP on call if BP remains elevated with medications. Pt educated that if chest pain, SOB, vision changes, numbness to extremities, etc occur call 911 or report to emergency room. Patient acknowledges understanding.

## 2022-06-25 ENCOUNTER — Emergency Department (HOSPITAL_BASED_OUTPATIENT_CLINIC_OR_DEPARTMENT_OTHER)
Admission: EM | Admit: 2022-06-25 | Discharge: 2022-06-26 | Disposition: A | Discharge status: Home / Self Care | Payer: PRIVATE HEALTH INSURANCE | Attending: Emergency Medicine | Admitting: Emergency Medicine

## 2022-06-25 ENCOUNTER — Other Ambulatory Visit: Payer: Self-pay

## 2022-06-25 ENCOUNTER — Encounter (HOSPITAL_BASED_OUTPATIENT_CLINIC_OR_DEPARTMENT_OTHER): Payer: Self-pay | Admitting: Emergency Medicine

## 2022-06-25 ENCOUNTER — Other Ambulatory Visit (HOSPITAL_COMMUNITY): Payer: Self-pay

## 2022-06-25 ENCOUNTER — Emergency Department (HOSPITAL_BASED_OUTPATIENT_CLINIC_OR_DEPARTMENT_OTHER): Payer: PRIVATE HEALTH INSURANCE

## 2022-06-25 DIAGNOSIS — Z79899 Other long term (current) drug therapy: Secondary | ICD-10-CM | POA: Diagnosis not present

## 2022-06-25 DIAGNOSIS — R030 Elevated blood-pressure reading, without diagnosis of hypertension: Secondary | ICD-10-CM | POA: Insufficient documentation

## 2022-06-25 DIAGNOSIS — R519 Headache, unspecified: Secondary | ICD-10-CM | POA: Diagnosis present

## 2022-06-25 DIAGNOSIS — E876 Hypokalemia: Secondary | ICD-10-CM | POA: Diagnosis not present

## 2022-06-25 DIAGNOSIS — H53149 Visual discomfort, unspecified: Secondary | ICD-10-CM

## 2022-06-25 LAB — BASIC METABOLIC PANEL
Anion gap: 9 (ref 5–15)
BUN: 9 mg/dL (ref 6–20)
CO2: 27 mmol/L (ref 22–32)
Calcium: 10.1 mg/dL (ref 8.9–10.3)
Chloride: 105 mmol/L (ref 98–111)
Creatinine, Ser: 0.67 mg/dL (ref 0.44–1.00)
GFR, Estimated: >60 mL/min (ref >60)
Glucose, Bld: 116 mg/dL — ABNORMAL HIGH (ref 70–99)
Potassium: 2.9 mmol/L — ABNORMAL LOW (ref 3.5–5.1)
Sodium: 141 mmol/L (ref 135–145)

## 2022-06-25 LAB — CBC
HCT: 38.6 % (ref 36.0–46.0)
Hemoglobin: 13.1 g/dL (ref 12.0–15.0)
MCH: 29.4 pg (ref 26.0–34.0)
MCHC: 33.9 g/dL (ref 30.0–36.0)
MCV: 86.5 fL (ref 80.0–100.0)
Platelets: 268 10*3/uL (ref 150–400)
RBC: 4.46 MIL/uL (ref 3.87–5.11)
RDW: 14.3 % (ref 11.5–15.5)
WBC: 9.4 10*3/uL (ref 4.0–10.5)
nRBC: 0 % (ref 0.0–0.2)

## 2022-06-25 LAB — LIPASE, BLOOD: Lipase: 16 U/L (ref 11–51)

## 2022-06-25 MED ORDER — SODIUM CHLORIDE 0.9 % IV BOLUS
1000.0000 mL | Freq: Once | INTRAVENOUS | Status: AC
  Administered 2022-06-25: 1000 mL via INTRAVENOUS

## 2022-06-25 MED ORDER — POTASSIUM CHLORIDE CRYS ER 20 MEQ PO TBCR
40.0000 meq | EXTENDED_RELEASE_TABLET | Freq: Once | ORAL | Status: AC
  Administered 2022-06-25: 40 meq via ORAL
  Filled 2022-06-25: qty 2

## 2022-06-25 MED ORDER — PROCHLORPERAZINE EDISYLATE 10 MG/2ML IJ SOLN
10.0000 mg | Freq: Once | INTRAMUSCULAR | Status: AC
  Administered 2022-06-25: 10 mg via INTRAVENOUS
  Filled 2022-06-25: qty 2

## 2022-06-25 MED ORDER — IOHEXOL 350 MG/ML SOLN
100.0000 mL | Freq: Once | INTRAVENOUS | Status: AC | PRN
  Administered 2022-06-25: 75 mL via INTRAVENOUS

## 2022-06-25 MED ORDER — DIPHENHYDRAMINE HCL 50 MG/ML IJ SOLN
50.0000 mg | Freq: Once | INTRAMUSCULAR | Status: AC
  Administered 2022-06-25: 50 mg via INTRAVENOUS
  Filled 2022-06-25: qty 1

## 2022-06-25 MED ORDER — LORAZEPAM 2 MG/ML IJ SOLN
1.0000 mg | Freq: Once | INTRAMUSCULAR | Status: AC
  Administered 2022-06-25: 1 mg via INTRAVENOUS
  Filled 2022-06-25: qty 1

## 2022-06-25 MED ORDER — SODIUM CHLORIDE 0.9 % IV SOLN
25.0000 mg | Freq: Once | INTRAVENOUS | Status: AC
  Administered 2022-06-25: 25 mg via INTRAVENOUS
  Filled 2022-06-25: qty 1

## 2022-06-25 MED ORDER — PROMETHAZINE HCL 25 MG/ML IJ SOLN
INTRAMUSCULAR | Status: AC
  Filled 2022-06-25: qty 1

## 2022-06-25 MED ORDER — ACETAMINOPHEN 325 MG PO TABS
650.0000 mg | ORAL_TABLET | Freq: Once | ORAL | Status: AC
  Administered 2022-06-25: 650 mg via ORAL
  Filled 2022-06-25: qty 2

## 2022-06-25 MED ORDER — LISINOPRIL 10 MG PO TABS
10.0000 mg | ORAL_TABLET | Freq: Every day | ORAL | Status: DC
  Administered 2022-06-25: 10 mg via ORAL
  Filled 2022-06-25: qty 1

## 2022-06-25 NOTE — ED Provider Notes (Addendum)
[facility] EMERGENCY DEPT Provider Note   CSN: [id number] Arrival date & time: 06/25/22  1533     History  Chief Complaint  Patient presents with   Migraine    Sandra Miller is a 46 y.o. female.  The history is provided by the patient and medical records. No language interpreter was used.  Headache Pain location:  Generalized Quality:  Dull and sharp Radiates to:  Does not radiate Severity currently:  9/10 Severity at highest:  9/10 Onset quality:  Gradual Duration:  4 weeks Timing:  Constant Progression:  Worsening (worsend last 4 days) Chronicity:  Recurrent Similar to prior headaches: worse.   Context: bright light   Relieved by:  Nothing Worsened by:  Nothing Associated symptoms: nausea, photophobia and vomiting   Associated symptoms: no abdominal pain, no back pain, no blurred vision, no congestion, no cough, no diarrhea, no dizziness, no fatigue, no fever, no loss of balance, no myalgias, no neck pain, no numbness, no paresthesias, no seizures, no sinus pressure, no visual change and no weakness        Home Medications Prior to Admission medications   Medication Sig Start Date End Date Taking? Authorizing Provider  acetaminophen (TYLENOL) 500 MG tablet Take 1 tablet (500 mg total) by mouth every 6 (six) hours as needed. 01/02/22   Carlisle Beers, FNP  baclofen (LIORESAL) 10 MG tablet Take 1 tablet (10 mg total) by mouth at bedtime for 7 days. 06/24/22 07/01/22  Theadora Rama Scales, PA-C  cetirizine (ZYRTEC ALLERGY) 10 MG tablet Take 1 tablet (10 mg total) by mouth daily. 06/17/22   Wallis Bamberg, PA-C  diclofenac Sodium (VOLTAREN) 1 % GEL Apply 4 grams topically 4  times daily as needed. 03/30/21   Hilts, Casimiro Needle, MD  doxylamine, Sleep, (UNISOM) 25 MG tablet Take 25 mg by mouth at bedtime as needed for sleep.    [provider]  FLUoxetine (PROZAC) 10 MG capsule Take 3 capsules (30 mg total) by mouth daily. 06/09/22   Arfeen, Phillips Grout, MD  gabapentin (NEURONTIN) 300 MG capsule Take 1 capsule (300 mg total) by mouth daily. 01/12/22   Meredith Pel, NP  ibuprofen (ADVIL) 600 MG tablet Take 1 tablet (600 mg total) by mouth every 8 (eight) hours as needed for up to 30 doses for fever, headache, mild pain or moderate pain (Inflammation). Take 1 tablet 3 times daily as needed for inflammation of upper airways and/or pain. 06/24/22   Theadora Rama Scales, PA-C  letrozole [facility]) 2.5 MG tablet Take 1 tablet (2.5 mg total) by mouth daily. 10/28/21   Serena Croissant, MD  letrozole [facility]) 2.5 MG tablet Take 1 tablet (2.5 mg total) by mouth daily. 10/29/21   Serena Croissant, MD  lisinopril (ZESTRIL) 20 MG tablet TAKE 1/2 TABLET BY MOUTH ONCE A DAY 12/06/21 12/06/22  Marcine Matar, MD  loratadine (CLARITIN) 10 MG tablet Take 10 mg by mouth daily.    [provider]  LORazepam (ATIVAN) 1 MG tablet Take 1 tablet (1 mg total) by mouth 3 (three) times daily as needed for anxiety 06/09/22   Arfeen, Phillips Grout, MD  methimazole (TAPAZOLE) 10 MG tablet Take 1 tablet (10 mg total) by mouth 2 (two) times daily. Patient not taking: Reported on 06/09/2022 12/06/21   Marcine Matar, MD  methimazole (TAPAZOLE) 5 MG tablet Take 1 tablet (5 mg total) by mouth daily. 05/10/22     Multiple Vitamin (MULTIVITAMIN WITH MINERALS) TABS tablet Take 1 tablet by  mouth daily.    [provider]  nicotine (NICODERM CQ) 14 mg/24hr patch Place 1 patch (14 mg total) onto the skin daily. 05/12/22   Serena Croissant, MD  OLANZapine (ZYPREXA) 15 MG tablet Take 1 tablet (15 mg total) by mouth at bedtime. 06/09/22   Arfeen, Phillips Grout, MD  omeprazole (PRILOSEC) 40 MG capsule Take 1 capsule (40 mg total) by mouth in the morning and at bedtime. 06/17/22   Meredith Pel, NP  ondansetron (ZOFRAN-ODT) 4 MG disintegrating tablet Take 1 tablet (4 mg total) by mouth every 8 (eight) hours as needed for nausea or vomiting. 02/11/22   Pricilla Loveless, MD  promethazine (PHENERGAN)  25 MG tablet TAKE 1 TABLET BY MOUTH EVERY 6 HOURS AS NEEDED FOR NAUSEA 07/26/21   Serena Croissant, MD  promethazine-dextromethorphan (PROMETHAZINE-DM) 6.25-15 MG/5ML syrup Take 2.5 mLs by mouth 3 (three) times daily as needed for cough. 06/17/22   Wallis Bamberg, PA-C  propranolol (INDERAL) 10 MG tablet Take 1 tablet by mouth 2 (two) times daily. Patient not taking: Reported on 06/09/2022 12/06/21   Marcine Matar, MD  pseudoephedrine (SUDAFED) 60 MG tablet Take 1 tablet (60 mg total) by mouth every 8 (eight) hours as needed for congestion. 06/17/22   Wallis Bamberg, PA-C  rosuvastatin (CRESTOR) 10 MG tablet TAKE 1 TABLET BY MOUTH ONCE A DAY 12/06/21 12/06/22  Marcine Matar, MD  senna-docusate (SENOKOT-S) 8.6-50 MG tablet Take 2 tablets by mouth at bedtime. For AFTER surgery only, do not take if having diarrhea 02/19/21   Cross, Efraim Kaufmann D, NP  prochlorperazine (COMPAZINE) 10 MG tablet TAKE 1 TABLET BY MOUTH EVERY 6 HOURS AS NEEDED FOR NAUSEA OR VOMITING Patient taking differently: Take 10 mg by mouth every 6 (six) hours as needed for nausea or vomiting.  06/16/20 08/18/20  Serena Croissant, MD      Allergies    Dilaudid [hydromorphone hcl], Aspirin, Depakote [divalproex sodium], and Minocycline    Review of Systems   Review of Systems  Constitutional:  Negative for chills, fatigue and fever.  HENT:  Negative for congestion and sinus pressure.   Eyes:  Positive for photophobia. Negative for blurred vision and visual disturbance.  Respiratory:  Negative for cough, choking and shortness of breath.   Cardiovascular:  Negative for chest pain.  Gastrointestinal:  Positive for nausea and vomiting. Negative for abdominal pain and diarrhea.  Genitourinary:  Negative for dysuria and flank pain.  Musculoskeletal:  Negative for back pain, myalgias and neck pain.  Skin:  Negative for rash and wound.  Neurological:  Positive for headaches. Negative for dizziness, seizures, syncope, speech difficulty, weakness,  light-headedness, numbness, paresthesias and loss of balance.  Psychiatric/Behavioral:  Negative for agitation and confusion.   All other systems reviewed and are negative.   Physical Exam Updated Vital Signs BP (!) 190/97   Pulse 93   Temp 97.8 F (36.6 C)   Resp 15   SpO2 100%  Physical Exam Vitals and nursing note reviewed.  Constitutional:      General: She is not in acute distress.    Appearance: She is well-developed. She is not ill-appearing, toxic-appearing or diaphoretic.  HENT:     Head: Normocephalic and atraumatic.     Nose: Nose normal.     Mouth/Throat:     Mouth: Mucous membranes are moist.  Eyes:     Extraocular Movements: Extraocular movements intact.     Conjunctiva/sclera: Conjunctivae normal.     Pupils: Pupils are equal, round, and reactive  to light.  Cardiovascular:     Rate and Rhythm: Normal rate and regular rhythm.     Heart sounds: No murmur heard. Pulmonary:     Effort: Pulmonary effort is normal. No respiratory distress.     Breath sounds: Normal breath sounds. No wheezing, rhonchi or rales.  Chest:     Chest wall: No tenderness.  Abdominal:     General: Abdomen is flat.     Palpations: Abdomen is soft.     Tenderness: There is no abdominal tenderness. There is no right CVA tenderness, left CVA tenderness, guarding or rebound.  Musculoskeletal:        General: No swelling or tenderness.     Cervical back: Neck supple. No tenderness.     Right lower leg: No edema.     Left lower leg: No edema.  Skin:    General: Skin is warm and dry.     Capillary Refill: Capillary refill takes less than 2 seconds.     Findings: No erythema or rash.  Neurological:     General: No focal deficit present.     Mental Status: She is alert.     Sensory: No sensory deficit.     Motor: No weakness.     Coordination: Coordination normal.  Psychiatric:        Mood and Affect: Mood normal.     ED Results / Procedures / Treatments   Labs (all labs ordered  are listed, but only abnormal results are displayed) Labs Reviewed  BASIC METABOLIC PANEL - Abnormal; Notable for the following components:      Result Value   Potassium 2.9 (*)    Glucose, Bld 116 (*)    All other components within normal limits  CBC  LIPASE, BLOOD    EKG None  Radiology CT Angio Head W or Wo Contrast  Result Date: 06/26/2022 CLINICAL DATA:  Initial evaluation for acute headache. EXAM: CT ANGIOGRAPHY HEAD CT VENOGRAM HEAD TECHNIQUE: Multidetector CT imaging of the head was performed using the standard protocol during bolus administration of intravenous contrast. Multiplanar CT image reconstructions and MIPs were obtained to evaluate the vascular anatomy. RADIATION DOSE REDUCTION: This exam was performed according to the departmental dose-optimization program which includes automated exposure control, adjustment of the mA and/or kV according to patient size and/or use of iterative reconstruction technique. CONTRAST:  75mL OMNIPAQUE IOHEXOL 350 MG/ML SOLN COMPARISON:  None Available. FINDINGS: CT HEAD Brain: Cerebral volume within normal limits for patient age. No evidence for acute intracranial hemorrhage. No findings to suggest acute large vessel territory infarct. No mass lesion, midline shift, or mass effect. Ventricles are normal in size without evidence for hydrocephalus. No extra-axial fluid collection identified. Partially empty sella noted. Vascular: No hyperdense vessel identified. Skull: Scalp soft tissues demonstrate no acute abnormality. Calvarium intact. Sinuses/Orbits: Globes and orbital soft tissues within normal limits. Minimal ethmoidal sinus disease. Paranasal sinuses are otherwise clear. No mastoid effusion. CTA HEAD Anterior circulation: Both internal carotid arteries widely patent to the termini without stenosis. A1 segments widely patent. Normal anterior communicating artery complex. Both anterior cerebral arteries widely patent to their distal aspects without  stenosis. No M1 stenosis or occlusion. Normal MCA bifurcations. Distal MCA branches well perfused and symmetric. Posterior circulation: Both V4 segments patent to the vertebrobasilar junction without stenosis. Both PICA origins patent and normal. Basilar widely patent to its distal aspect without stenosis. Superior cerebellar arteries patent bilaterally. Both PCAs primarily supplied via the basilar and are well perfused to  there distal aspects. Venous sinuses: Patent allowing for timing the contrast bolus. Anatomic variants: None significant.  No intracranial aneurysm. Review of the MIP images confirms the above findings. CT VENOGRAM Normal enhancement seen throughout the superior sagittal sinus to the torcula. Transverse and sigmoid sinuses are patent as are the jugular bulbs and visualized proximal internal jugular veins. Right transverse sinus dominant. Straight sinus, vein of Galen, and internal cerebral veins are patent. No visible cortical vein thrombosis. No dural sinus thrombosis. IMPRESSION: 1. Normal CTA of the head. No large vessel occlusion, hemodynamically significant stenosis, or other acute vascular abnormality. No aneurysm. 2. Negative CT venogram.  Evidence for dural sinus thrombosis. 3. No other acute intracranial abnormality. 4. Partially empty sella. Finding is nonspecific, but can be seen in the setting of idiopathic intracranial hypertension. Electronically Signed   By: Rise Mu M.D.   On: 06/26/2022 00:36   CT VENOGRAM HEAD  Result Date: 06/26/2022 CLINICAL DATA:  Initial evaluation for acute headache. EXAM: CT ANGIOGRAPHY HEAD CT VENOGRAM HEAD TECHNIQUE: Multidetector CT imaging of the head was performed using the standard protocol during bolus administration of intravenous contrast. Multiplanar CT image reconstructions and MIPs were obtained to evaluate the vascular anatomy. RADIATION DOSE REDUCTION: This exam was performed according to the departmental dose-optimization  program which includes automated exposure control, adjustment of the mA and/or kV according to patient size and/or use of iterative reconstruction technique. CONTRAST:  75mL OMNIPAQUE IOHEXOL 350 MG/ML SOLN COMPARISON:  None Available. FINDINGS: CT HEAD Brain: Cerebral volume within normal limits for patient age. No evidence for acute intracranial hemorrhage. No findings to suggest acute large vessel territory infarct. No mass lesion, midline shift, or mass effect. Ventricles are normal in size without evidence for hydrocephalus. No extra-axial fluid collection identified. Partially empty sella noted. Vascular: No hyperdense vessel identified. Skull: Scalp soft tissues demonstrate no acute abnormality. Calvarium intact. Sinuses/Orbits: Globes and orbital soft tissues within normal limits. Minimal ethmoidal sinus disease. Paranasal sinuses are otherwise clear. No mastoid effusion. CTA HEAD Anterior circulation: Both internal carotid arteries widely patent to the termini without stenosis. A1 segments widely patent. Normal anterior communicating artery complex. Both anterior cerebral arteries widely patent to their distal aspects without stenosis. No M1 stenosis or occlusion. Normal MCA bifurcations. Distal MCA branches well perfused and symmetric. Posterior circulation: Both V4 segments patent to the vertebrobasilar junction without stenosis. Both PICA origins patent and normal. Basilar widely patent to its distal aspect without stenosis. Superior cerebellar arteries patent bilaterally. Both PCAs primarily supplied via the basilar and are well perfused to there distal aspects. Venous sinuses: Patent allowing for timing the contrast bolus. Anatomic variants: None significant.  No intracranial aneurysm. Review of the MIP images confirms the above findings. CT VENOGRAM Normal enhancement seen throughout the superior sagittal sinus to the torcula. Transverse and sigmoid sinuses are patent as are the jugular bulbs and  visualized proximal internal jugular veins. Right transverse sinus dominant. Straight sinus, vein of Galen, and internal cerebral veins are patent. No visible cortical vein thrombosis. No dural sinus thrombosis. IMPRESSION: 1. Normal CTA of the head. No large vessel occlusion, hemodynamically significant stenosis, or other acute vascular abnormality. No aneurysm. 2. Negative CT venogram.  Evidence for dural sinus thrombosis. 3. No other acute intracranial abnormality. 4. Partially empty sella. Finding is nonspecific, but can be seen in the setting of idiopathic intracranial hypertension. Electronically Signed   By: Rise Mu M.D.   On: 06/26/2022 00:36    Procedures Procedures  Medications Ordered in ED Medications  lisinopril (ZESTRIL) tablet 10 mg (10 mg Oral Given 06/25/22 2348)  promethazine (PHENERGAN) 25 mg in sodium chloride 0.9 % 50 mL IVPB (25 mg Intravenous New Bag/Given 06/25/22 2358)  promethazine (PHENERGAN) 25 MG/ML injection (has no administration in time range)  potassium chloride SA (KLOR-CON M) CR tablet 40 mEq (40 mEq Oral Given 06/25/22 2004)  prochlorperazine (COMPAZINE) injection 10 mg (10 mg Intravenous Given 06/25/22 2006)  diphenhydrAMINE (BENADRYL) injection 50 mg (50 mg Intravenous Given 06/25/22 2008)  sodium chloride 0.9 % bolus 1,000 mL (0 mLs Intravenous Stopped 06/25/22 2147)  LORazepam (ATIVAN) injection 1 mg (1 mg Intravenous Given 06/25/22 2349)  acetaminophen (TYLENOL) tablet 650 mg (650 mg Oral Given 06/25/22 2347)  iohexol (OMNIPAQUE) 350 MG/ML injection 100 mL (75 mLs Intravenous Contrast Given 06/25/22 2329)    ED Course/ Medical Decision Making/ A&P                           Medical Decision Making Amount and/or Complexity of Data Reviewed Labs: ordered. Radiology: ordered.  Risk OTC drugs. Prescription drug management.    Sandra Miller is a 46 y.o. female with a past medical history significant for migraines, thyroid/Graves'  disease, previous vulvar cancer and breast cancer, kidney stones, anxiety, panic attacks, PTSD, and bipolar disorder who presents with headache.  According to patient, she has had a long history of headaches but they have worsened over the last month or so.  She reports that last 4 days they have been unbearable.  She went to an urgent care and was given muscle relaxant she has not taken it because she is worried about it.  She reports the headache is severe and she also had some nausea and vomiting.  She denies any new vision changes, fevers, chills, neck pain, neck stiffness, or any rashes.  She denies any chest pain, abdominal pain, back pain, or flank pain.  Denies trauma.  Denies any history of meningitis and denies history of any dural venous sinus thrombus.  Patient reports her blood pressure has been more elevated recently but she is also been having more pain.  She reports she scheduled see her doctor soon.  On exam, lungs clear and chest nontender.  Abdomen tender.  No focal neurologic deficits on my exam.  Intact sensation, strength, and pulses in all extremities.  Symmetric smile.  Clear speech.  Pupils are symmetric and reactive with normal extract movements.  She is very photosensitive.  Normal finger-nose finger testing bilaterally.  Patient well-appearing.  No neck trauma or carotid bruit on exam.  No tenderness.  Patient well-appearing otherwise.  Had a shared decision conversation with patient.  Patient had some labs in triage that was reassuring aside from a low potassium which we will replete orally initially.  Due to the slight change in her headaches, we did offer CT venogram however patient reports her headache is similar enough to previous headache that she would like to try headache cocktail first.  If headache improves, anticipate discharge with an amatory referral to neurology and refill of her home Phenergan which is worked for nausea for in the past.  If symptoms change or worsen,  anticipate discussion about possible imaging.  Given lack of neck pain, neck stiffness, fevers, chills, and no leukocytosis, low suspicion for meningitis and doubt we need to do a lumbar puncture and patient agrees.  Given the acute on chronic nature of the headaches and  her history of similar, have low suspicion for a intracranial hemorrhage or aneurysmal bleed at this time.    Patient agrees with this plan.  11:11 PM Patient reports her headache improved briefly but then started to worsen again.  She is having severe headache again and blood pressure has been elevated at times.  Had a shared decision made conversation with patient and family who is now arrived.  They are concerned about aneurysms or bleeding given the elevated blood pressures and after further discussion agrees that she may need work-up to rule out dural venous sinus thrombus given the location of her pain going from the occiput towards the front and being longer any typical compared to baseline.  We will do the CTA and CTV and is reassuring suspect this is more evolution of her chronic migraines.  Patient will get her dose of lisinopril that she missed already and her dose of Ativan.  We will also give her some Phenergan as she is having nausea again.  If work-up is reassuring, anticipate discharge home to discuss with her PCP about medication titration for her blood pressure.  Also anticipate she will follow-up with outpatient neurology.   Imaging returned without evidence of stroke, dissection, bleeding, aneurysm, or dural venous sinus thrombus.  A partial empty sella was seen and we discussed that is nonspecific but could be related to intracranial hypertension.  Patient will follow-up with her neurologist for this.  No vision changes reported.  Patient agrees with plan of care and was discharged in stable condition with improving symptoms.    Final Clinical Impression(s) / ED Diagnoses Final diagnoses:  Hypokalemia  Elevated  blood pressure reading  Bad headache  Photophobia    Clinical Impression: 1. Bad headache   2. Hypokalemia   3. Elevated blood pressure reading   4. Photophobia     Disposition: Care transferred to oncoming team to await results of CTA and CTV on reassessment.  If imaging is reassuring, dissipate discharge home to follow-up with outpatient PCP and neurology team.   This note was prepared with assistance of Dragon voice recognition software. Occasional wrong-word or sound-a-like substitutions may have occurred due to the inherent limitations of voice recognition software.     Taji Barretto, Canary Brim, MD 06/26/22 0032    Addisynn Vassell, Canary Brim, MD 06/26/22 [phone]

## 2022-06-25 NOTE — ED Triage Notes (Signed)
Pt has migraine for 4 days, blood pressure high. 160/102 home blood pressure.

## 2022-06-26 MED ORDER — POTASSIUM CHLORIDE CRYS ER 20 MEQ PO TBCR: 20.0000 meq | EXTENDED_RELEASE_TABLET | Freq: Two times a day (BID) | ORAL | 0 refills | Status: DC

## 2022-06-26 MED ORDER — PROMETHAZINE HCL 25 MG PO TABS: 25.0000 mg | ORAL_TABLET | Freq: Four times a day (QID) | ORAL | 0 refills | Status: DC | PRN

## 2022-06-26 NOTE — Discharge Instructions (Addendum)
Your history, exam, work-up today revealed some low potassium which we repleted orally.  Please take the potassium pills for the next 2 days to help increase that.  Your blood pressure was elevated today however it did start to improve after he started to improve.  As we discussed, due to the up-and-down nature of her blood pressure, we recommend following up with your for blood pressure management and then the neurologist for further complex headaches.  Please use the prescription for nausea medicine to help with the nausea you have been having.   Your imaging returned without evidence of acute aneurysm, stroke, masses, or evidence of that dural venous sinus thrombus we discussed.  Please follow-up with outpatient neurology to discuss further management.  If any symptoms change or worsen acutely, please return to the nearest emergency department.

## 2022-06-27 ENCOUNTER — Encounter: Payer: Self-pay | Admitting: Hematology and Oncology

## 2022-06-30 ENCOUNTER — Ambulatory Visit: Payer: Self-pay | Admitting: Radiation Oncology

## 2022-07-02 ENCOUNTER — Other Ambulatory Visit (HOSPITAL_COMMUNITY): Payer: Self-pay

## 2022-07-07 ENCOUNTER — Other Ambulatory Visit (HOSPITAL_COMMUNITY): Payer: Self-pay

## 2022-07-07 ENCOUNTER — Ambulatory Visit (INDEPENDENT_AMBULATORY_CARE_PROVIDER_SITE_OTHER): Payer: PRIVATE HEALTH INSURANCE | Admitting: Neurology

## 2022-07-07 ENCOUNTER — Ambulatory Visit (INDEPENDENT_AMBULATORY_CARE_PROVIDER_SITE_OTHER): Payer: PRIVATE HEALTH INSURANCE | Admitting: Nurse Practitioner

## 2022-07-07 ENCOUNTER — Encounter: Payer: Self-pay | Admitting: Hematology and Oncology

## 2022-07-07 ENCOUNTER — Encounter: Payer: Self-pay | Admitting: Nurse Practitioner

## 2022-07-07 ENCOUNTER — Encounter: Payer: Self-pay | Admitting: Neurology

## 2022-07-07 VITALS — BP 150/91 | HR 100 | Ht 65.0 in | Wt 202.0 lb

## 2022-07-07 VITALS — BP 148/99 | HR 102 | Temp 98.4°F | Ht 65.0 in | Wt 203.6 lb

## 2022-07-07 DIAGNOSIS — G43709 Chronic migraine without aura, not intractable, without status migrainosus: Secondary | ICD-10-CM | POA: Diagnosis not present

## 2022-07-07 DIAGNOSIS — I10 Essential (primary) hypertension: Secondary | ICD-10-CM

## 2022-07-07 MED ORDER — LISINOPRIL-HYDROCHLOROTHIAZIDE 10-12.5 MG PO TABS
1.0000 | ORAL_TABLET | Freq: Every day | ORAL | 3 refills | Status: DC
  Filled 2022-07-07: qty 90, 90d supply, fill #0

## 2022-07-07 MED ORDER — AJOVY 225 MG/1.5ML ~~LOC~~ SOAJ: 225.0000 mg | SUBCUTANEOUS | 0 refills | Status: DC

## 2022-07-07 MED ORDER — AJOVY 225 MG/1.5ML ~~LOC~~ SOAJ
225.0000 mg | SUBCUTANEOUS | 11 refills | Status: DC
  Filled 2022-07-07 – 2022-07-21 (×2): qty 1.5, 30d supply, fill #0

## 2022-07-07 MED ORDER — UBRELVY 100 MG PO TABS: 100.0000 mg | ORAL_TABLET | ORAL | 0 refills | Status: DC | PRN

## 2022-07-07 NOTE — Progress Notes (Signed)
[facility]    Provider:  Dr Jaynee Eagles Requesting Provider: Tegeler, Gwenyth Allegra,* Primary Care Provider:  Ladell Pier, MD  CC:  headaches  HPI:  Sandra Miller is a 46 y.o. female here as requested by Tegeler, Gwenyth Allegra,* for headaches. She has had a headache for a week ago. Migraines started years ago. PMHx bipolar, cancer, depression, gad, graves, kidney stones, htn, panic attacks, radiation therapy, ocd, pre-diates, ocd, PTSD, RLS, recovering alcoholic, breast and vulvar cancer, Pulsating/pounding/throbbing,photo/phonophobia, nausea, hurts to move, nausea, sister has migraines and daughter. Whole head, no hearing changes, sleep helps, can radiate to the back of the head. Vision is blurry all the time. She doesn't see a difference with glasses. Daily headaches. At least 10 migraine days a month that can last 24 hours and be moderate to severe although has had 4 days of migraines in a row. No aura. No other focal neurologic deficits, associated symptoms, inciting events or modifiable factors.   Reviewed notes, labs and imaging from outside physicians, which showed:  06/28/2022 tsh and ft4 nml  From a thorough review of records meds tried: Tylenol, baclofen,voltaren, benadryl, prozac, gabapentin, ibupren, ketoralc, labetalol, lisinopril, magnesium, methimazole,robaxin, reglan, medrol, zyprexa, prednisone, compazine, propranolol,has been on topamx also  topiramate contraindicated due to kidney stones, trazodone, effexor,amitriptyline and nortrityline, imitrex and maxalt(palpitations), aimovig contraindicated due to constipation  TECHNIQUE: Multidetector CT imaging of the head was performed using the standard protocol during bolus administration of intravenous contrast. Multiplanar CT image reconstructions and MIPs were obtained to evaluate the vascular anatomy.   RADIATION DOSE REDUCTION: This exam was performed according to the departmental dose-optimization  program which includes automated exposure control, adjustment of the mA and/or kV according to patient size and/or use of iterative reconstruction technique.   CONTRAST:  58m OMNIPAQUE IOHEXOL 350 MG/ML SOLN   COMPARISON:  None Available.   FINDINGS: CT HEAD   Brain: Cerebral volume within normal limits for patient age.   No evidence for acute intracranial hemorrhage. No findings to suggest acute large vessel territory infarct. No mass lesion, midline shift, or mass effect. Ventricles are normal in size without evidence for hydrocephalus. No extra-axial fluid collection identified. Partially empty sella noted.   Vascular: No hyperdense vessel identified.   Skull: Scalp soft tissues demonstrate no acute abnormality. Calvarium intact.   Sinuses/Orbits: Globes and orbital soft tissues within normal limits.   Minimal ethmoidal sinus disease. Paranasal sinuses are otherwise clear. No mastoid effusion.   CTA HEAD   Anterior circulation: Both internal carotid arteries widely patent to the termini without stenosis. A1 segments widely patent. Normal anterior communicating artery complex. Both anterior cerebral arteries widely patent to their distal aspects without stenosis. No M1 stenosis or occlusion. Normal MCA bifurcations. Distal MCA branches well perfused and symmetric.   Posterior circulation: Both V4 segments patent to the vertebrobasilar junction without stenosis. Both PICA origins patent and normal. Basilar widely patent to its distal aspect without stenosis. Superior cerebellar arteries patent bilaterally. Both PCAs primarily supplied via the basilar and are well perfused to there distal aspects.   Venous sinuses: Patent allowing for timing the contrast bolus.   Anatomic variants: None significant.  No intracranial aneurysm.   Review of the MIP images confirms the above findings.   CT VENOGRAM   Normal enhancement seen throughout the superior sagittal sinus  to the torcula. Transverse and sigmoid sinuses are patent as are the jugular bulbs and visualized proximal internal jugular veins. Right transverse sinus dominant. Straight sinus, vein  of Galen, and internal cerebral veins are patent. No visible cortical vein thrombosis. No dural sinus thrombosis.   IMPRESSION: 1. Normal CTA of the head. No large vessel occlusion, hemodynamically significant stenosis, or other acute vascular abnormality. No aneurysm. 2. Negative CT venogram.  Evidence for dural sinus thrombosis. 3. No other acute intracranial abnormality. 4. Partially empty sella. Finding is nonspecific, but can be seen in the setting of idiopathic intracranial hypertension.  Review of Systems: Patient complains of symptoms per HPI as well as the following symptoms migraines. Pertinent negatives and positives per HPI. All others negative.   Social History   Socioeconomic History   Marital status: Single    Spouse name: Not on file   Number of children: Not on file   Years of education: Not on file   Highest education level: Not on file  Occupational History   Not on file  Tobacco Use   Smoking status: Every Day    Packs/day: 0.50    Years: 22.00    Total pack years: 11.00    Types: Cigarettes   Smokeless tobacco: Never   Tobacco comments:    Recently started a smoking cessation class.   Vaping Use   Vaping Use: Never used  Substance and Sexual Activity   Alcohol use: Not Currently    Alcohol/week: 0.0 standard drinks of alcohol    Comment:  hx alcohollism  in remission since 2014   Drug use: Yes    Types: Marijuana    Comment: occas.   Sexual activity: Yes    Partners: Male    Birth control/protection: Surgical  Other Topics Concern   Not on file  Social History Narrative   Former [facility] patient.      Was on disability at one point.   Return to the workforce.  40 hours a week at [employer], 10 hours a week on the weekends at [employer] in [place] at night 10 to 12 hours a week.      Has grown children, she lives alone with a pet, continues to smoke no alcohol or drug use at this time      History of EtOH abuse and THC use.         Social Determinants of Health   Financial Resource Strain: Low Risk  (07/01/2021)   Overall Financial Resource Strain (CARDIA)    Difficulty of Paying Living Expenses: Not hard at all  Food Insecurity: No Food Insecurity (07/01/2021)   Hunger Vital Sign    Worried About Running Out of Food in the Last Year: Never true    Ran Out of Food in the Last Year: Never true  Transportation Needs: No Transportation Needs (07/01/2021)   PRAPARE - Hydrologist (Medical): No    Lack of Transportation (Non-Medical): No  Physical Activity: Inactive (07/01/2021)   Exercise Vital Sign    Days of Exercise per Week: 0 days    Minutes of Exercise per Session: 0 min  Stress: Stress Concern Present (07/01/2021)   [place]    Feeling of Stress : Rather much  Social Connections: Socially Isolated (07/01/2021)   Social Connection and Isolation Panel [NHANES]    Frequency of Communication with Friends and Family: More than three times a week    Frequency of Social Gatherings with Friends and Family: More than three times a week    Attends Religious Services: Never    Active  Member of Clubs or Organizations: No    Attends Archivist Meetings: Never    Marital Status: Never married  Intimate Partner Violence: Not At Risk (07/01/2021)   Humiliation, Afraid, Rape, and Kick questionnaire    Fear of Current or Ex-Partner: No    Emotionally Abused: No    Physically Abused: No    Sexually Abused: No    Family History  Problem Relation Age of Onset   Heart disease Father    Lung cancer Father        d. 57   Alcohol abuse Father    Heart disease Mother    Depression Mother    Anxiety disorder  Mother    Drug abuse Brother    Alcohol abuse Brother    Drug abuse Brother    ADD / ADHD Brother    Colon polyps Brother    Cancer Paternal Grandfather        "stomach"   Diabetes Maternal Grandfather    Diabetes Paternal Grandmother    Kidney disease Maternal Uncle    Cirrhosis Cousin        alcoholic   Anxiety disorder Maternal Aunt    Depression Maternal Aunt    Cancer Cousin        maternal; ovarian cancer or other "female" cancer?   Lung cancer Paternal Uncle 24   Throat cancer Cousin        paternal; dx 2s   Lung cancer Cousin        paternal; dx 39s    Past Medical History:  Diagnosis Date   Arthritis    Bipolar 1 disorder ([place])    Cancer ([place])    vulva, and breast   Complication of anesthesia    wakes up during procedures   Depression    GAD (generalized anxiety disorder)    Genital HSV    currently per pt  no break out 03-22-2016    GERD (gastroesophageal reflux disease)    Graves disease    Hiatal hernia    History of cervical dysplasia    2012 laser ablation   History of esophageal dilatation    for dysphasia -- x2 dilated   History of gastric ulcer    History of Helicobacter pylori infection    remote hx   History of hidradenitis suppurativa    "gets all over body intermittantly"     History of hypertension    no issue since stopped drinking alcohol 2014   History of kidney stones    History of panic attacks    History of radiation therapy 03/16/2020-05/08/2020   vulva  Dr Gery Pray   History of radiation therapy 09/09/2020-10/23/2020   left chest wall/left SCV   Dr Gery Pray   Hypertension    Iron deficiency anemia    Left ureteral stone    OCD (obsessive compulsive disorder)    PONV (postoperative nausea and vomiting)    Pre-diabetes    PTSD (post-traumatic stress disorder)    Recovering alcoholic in remission ([place])    since 2014   RLS (restless legs syndrome)    Smokers' cough ([facility])    Thyroid disease    Urgency of urination     Yeast infection involving the vagina and surrounding area    secondary to taking antibiotic    Patient Active Problem List   Diagnosis Date Noted   Chronic migraine without aura without status migrainosus, not intractable 07/07/2022   Sebaceous cyst 12/06/2021   Pain in  left knee 11/03/2021   Hyperthyroidism 08/13/2021   Bipolar 1 disorder ([place]) 05/07/2021   Obesity (BMI 30.0-34.9) 05/07/2021   Hyperlipidemia 08/12/2020   Prediabetes 08/12/2020   Hypomagnesemia 08/12/2020   Mutation in BRIP1 gene 06/24/2020   Hypokalemia 06/17/2020   Dehydration    Colitis 06/16/2020   Port-A-Cath in place 03/11/2020   Malignant neoplasm of upper-inner quadrant of left breast in female, estrogen receptor positive ([place]) 02/25/2020   Vulvar cancer ([place]) 01/29/2020   Vulvar ulceration 01/23/2020   Essential hypertension 10/31/2019   Nausea and vomiting 12/27/2018   Seasonal allergic rhinitis 12/27/2018   History of ELISA positive for HSV 10/01/2018   Restless leg syndrome 04/01/2016   Nephrolithiasis 03/08/2016   Obstructive pyelonephritis 03/08/2016   Normocytic anemia 03/08/2016   Hidradenitis suppurativa of left axilla 11/03/2015   Loss of weight 09/08/2015   Neck pain on left side 10/22/2014   Genital warts 05/20/2014   GERD (gastroesophageal reflux disease) 12/26/2012   Functional dyspepsia 12/26/2012   Tobacco abuse 08/22/2012   Herpes simplex virus (HSV) infection 02/03/2007    Past Surgical History:  Procedure Laterality Date   CESAREAN SECTION  1995   w/  Bilateral Tubal Ligation   COLONOSCOPY  last one 08-09-2013   CYSTOSCOPY W/ URETERAL STENT PLACEMENT Left 03/29/2016   Procedure: CYSTOSCOPY WITH STENT REPLACEMENT;  Surgeon: Nickie Retort, MD;  Location: [facility];  Service: Urology;  Laterality: Left;   CYSTOSCOPY WITH RETROGRADE PYELOGRAM, URETEROSCOPY AND STENT PLACEMENT Left 03/08/2016   Procedure: CYSTOSCOPY WITH  LEFT RETROGRADE PYELOGRAM, AND STENT  PLACEMENT;  Surgeon: Nickie Retort, MD;  Location: [facility];  Service: Urology;  Laterality: Left;   CYSTOSCOPY/RETROGRADE/URETEROSCOPY/STONE EXTRACTION WITH BASKET Left 03/29/2016   Procedure: CYSTOSCOPY/RETROGRADE/URETEROSCOPY/STONE EXTRACTION WITH BASKET;  Surgeon: Nickie Retort, MD;  Location[facility];  Service: Urology;  Laterality: Left;   ENDOMETRIAL ABLATION W/ NOVASURE  04-01-2010   ESOPHAGOGASTRODUODENOSCOPY  last one 08-09-2013   KNEE ARTHROSCOPY Left as teen   LASER ABLATION OF THE CERVIX  2012 approx   MASTECTOMY WITH AXILLARY LYMPH NODE DISSECTION Left 07/27/2020   Procedure: LEFT MASTECTOMY WITH LEFT RADIOACTIVE SEED GUIDED TARGETED AXILLARY LYMPH NODE DISSECTION;  Surgeon: Alphonsa Overall, MD;  Location: [facility];  Service: General;  Laterality: Left;   PORT-A-CATH REMOVAL Right 05/18/2021   Procedure: REMOVAL PORT-A-CATH;  Surgeon: Donnie Mesa, MD;  Location: [facility];  Service: General;  Laterality: Right;   PORTACATH PLACEMENT Right 03/06/2020   Procedure: INSERTION PORT-A-CATH WITH ULTRASOUND GUIDANCE;  Surgeon: Alphonsa Overall, MD;  Location: [facility];  Service: General;  Laterality: Right;   ROBOTIC ASSISTED TOTAL HYSTERECTOMY WITH BILATERAL SALPINGO OOPHERECTOMY N/A 05/18/2021   Procedure: XI ROBOTIC ASSISTED TOTAL HYSTERECTOMY WITH BILATERAL SALPINGO OOPHORECTOMY;  Surgeon: Everitt Amber, MD;  Location: [facility];  Service: Gynecology;  Laterality: N/A;   TRANSTHORACIC ECHOCARDIOGRAM  05-19-2006   lvsf normal, ef 55-65%, there was mild flattening of the interventricular septum during diastoli/  RV size at upper limits normal   TUBAL LIGATION     VULVA /PERINEUM BIOPSY N/A 05/18/2021   Procedure: VULVAR BIOPSY;  Surgeon: Everitt Amber, MD;  Location: [facility];  Service: Gynecology;  Laterality: N/A;   WISDOM TOOTH EXTRACTION  age 33 's    Current Outpatient Medications  Medication Sig Dispense Refill   acetaminophen (TYLENOL) 500 MG tablet Take 1  tablet (500 mg total) by mouth every 6 (six) hours as needed. (Patient taking differently: Take 500 mg by  mouth as needed.) 30 tablet 0   cetirizine (ZYRTEC ALLERGY) 10 MG tablet Take 1 tablet (10 mg total) by mouth daily. 30 tablet 0   diclofenac Sodium (VOLTAREN) 1 % GEL Apply 4 grams topically 4  times daily as needed. 500 g 6   doxylamine, Sleep, (UNISOM) 25 MG tablet Take 25 mg by mouth at bedtime as needed for sleep.     FLUoxetine (PROZAC) 10 MG capsule Take 3 capsules (30 mg total) by mouth daily. 90 capsule 2   Fremanezumab-vfrm (AJOVY) 225 MG/1.5ML SOAJ Inject 225 mg into the skin every 30 (thirty) days. 4.5 mL 0   Fremanezumab-vfrm (AJOVY) 225 MG/1.5ML SOAJ Inject 225 mg into the skin every 30 (thirty) days. 1.5 mL 11   gabapentin (NEURONTIN) 300 MG capsule Take 1 capsule (300 mg total) by mouth daily. 30 capsule 5   letrozole (FEMARA) 2.5 MG tablet Take 1 tablet (2.5 mg total) by mouth daily. 90 tablet 3   letrozole (FEMARA) 2.5 MG tablet Take 1 tablet (2.5 mg total) by mouth daily. 90 tablet 3   lisinopril-hydrochlorothiazide (ZESTORETIC) 10-12.5 MG tablet Take 1 tablet by mouth daily. 90 tablet 3   loratadine (CLARITIN) 10 MG tablet Take 10 mg by mouth daily.     LORazepam (ATIVAN) 1 MG tablet Take 1 tablet (1 mg total) by mouth 3 (three) times daily as needed for anxiety 90 tablet 1   methimazole (TAPAZOLE) 10 MG tablet Take 1 tablet (10 mg total) by mouth 2 (two) times daily. 60 tablet 4   methimazole (TAPAZOLE) 5 MG tablet Take 1 tablet (5 mg total) by mouth daily. 90 tablet 1   Multiple Vitamin (MULTIVITAMIN WITH MINERALS) TABS tablet Take 1 tablet by mouth daily.     nicotine (NICODERM CQ) 14 mg/24hr patch Place 1 patch (14 mg total) onto the skin daily. 28 patch 0   OLANZapine (ZYPREXA) 15 MG tablet Take 1 tablet (15 mg total) by mouth at bedtime. 30 tablet 2   omeprazole (PRILOSEC) 40 MG capsule Take 1 capsule (40 mg total) by mouth in the morning and at bedtime. 60 capsule 3    ondansetron (ZOFRAN-ODT) 4 MG disintegrating tablet Take 1 tablet (4 mg total) by mouth every 8 (eight) hours as needed for nausea or vomiting. 10 tablet 0   promethazine (PHENERGAN) 25 MG tablet TAKE 1 TABLET BY MOUTH EVERY 6 HOURS AS NEEDED FOR NAUSEA 30 tablet 6   promethazine (PHENERGAN) 25 MG tablet Take 1 tablet (25 mg total) by mouth every 6 (six) hours as needed for nausea or vomiting. 14 tablet 0   promethazine-dextromethorphan (PROMETHAZINE-DM) 6.25-15 MG/5ML syrup Take 2.5 mLs by mouth 3 (three) times daily as needed for cough. 100 mL 0   propranolol (INDERAL) 10 MG tablet Take 1 tablet by mouth 2 (two) times daily. (Patient taking differently: Take 10 mg by mouth as needed.) 180 tablet 1   pseudoephedrine (SUDAFED) 60 MG tablet Take 1 tablet (60 mg total) by mouth every 8 (eight) hours as needed for congestion. 30 tablet 0   rosuvastatin (CRESTOR) 10 MG tablet TAKE 1 TABLET BY MOUTH ONCE A DAY 90 tablet 3   senna-docusate (SENOKOT-S) 8.6-50 MG tablet Take 2 tablets by mouth at bedtime. For AFTER surgery only, do not take if having diarrhea 30 tablet 0   Ubrogepant (UBRELVY) 100 MG TABS Take 100 mg by mouth every 2 (two) hours as needed. Maximum 267m a day. 6 tablet 0   potassium chloride SA (KLOR-CON M) 20  MEQ tablet Take 1 tablet (20 mEq total) by mouth 2 (two) times daily for 3 days. 6 tablet 0   No current facility-administered medications for this visit.    Allergies as of 07/07/2022 - Review Complete 07/07/2022  Allergen Reaction Noted   Dilaudid [hydromorphone hcl] Other (See Comments) 06/18/2020   Aspirin Hives    Depakote [divalproex sodium] Nausea And Vomiting 11/01/2016   Minocycline Hives 09/23/2011    Vitals: BP (!) 150/91   Pulse 100   Ht _0  (1.651 m)   Wt 202 lb (91.6 kg)   BMI 33.61 kg/m  Last Weight:  Wt Readings from Last 1 Encounters:  07/07/22 202 lb (91.6 kg)   Last Height:   Ht Readings from Last 1 Encounters:  07/07/22 _1  (1.651 m)      Physical exam: Exam: Gen: NAD, conversant, well nourised, obese, well groomed                     CV: RRR, no MRG. No Carotid Bruits. No peripheral edema, warm, nontender Eyes: Conjunctivae clear without exudates or hemorrhage  Neuro: Detailed Neurologic Exam  Speech:    Speech is normal; fluent and spontaneous with normal comprehension.  Cognition:    The patient is oriented to person, place, and time;     recent and remote memory intact;     language fluent;     normal attention, concentration,     fund of knowledge Cranial Nerves:    The pupils are equal, round, and reactive to light. The fundi are normal and spontaneous venous pulsations are present. Visual fields are full to finger confrontation. Extraocular movements are intact. Trigeminal sensation is intact and the muscles of mastication are normal. The face is symmetric. The palate elevates in the midline. Hearing intact. Voice is normal. Shoulder shrug is normal. The tongue has normal motion without fasciculations.   Coordination:    Normal   Gait:    normal.   Motor Observation:    No asymmetry, no atrophy, and no involuntary movements noted. Tone:    Normal muscle tone.    Posture:    Posture is normal. normal erect    Strength:    Strength is V/V in the upper and lower limbs.      Sensation: intact to LT     Reflex Exam:  DTR's:    Deep tendon reflexes in the upper and lower extremities are symmetrical bilaterally.   Toes:    The toes are downgoing bilaterally.   Clonus:    Clonus is absent.    Assessment/Plan:  Patient with chronic migraines. Empty sella may be incidental but if no improvement can consider further workup for IDIOPATHIC INTRACRANIAL HYPERTENSION, exam including findoscopic exam normal.   Start Ajovy as prevention once monthly. Next depending on insurance would try Emgality. As needed Ubrelvy: Please take one tablet at the onset of your headache. If it does not improve the  symptoms please take one additional tablet 2 hours. Next would try Nurtec.  No orders of the defined types were placed in this encounter.  Meds ordered this encounter  Medications   Fremanezumab-vfrm (AJOVY) 225 MG/1.5ML SOAJ    Sig: Inject 225 mg into the skin every 30 (thirty) days.    Dispense:  4.5 mL    Refill:  0   Fremanezumab-vfrm (AJOVY) 225 MG/1.5ML SOAJ    Sig: Inject 225 mg into the skin every 30 (thirty) days.    Dispense:  1.5  mL    Refill:  11    BIN [id number] PCN CN group [id number] ID [id number] expires 09/04/2022   Ubrogepant (UBRELVY) 100 MG TABS    Sig: Take 100 mg by mouth every 2 (two) hours as needed. Maximum 224m a day.    Dispense:  6 tablet    Refill:  0    Cc: Tegeler, CValarie Merino MD  ASarina Ill MD  [facility] [address]SMeigsGMud Lake [place] [address] Phone [phone] [phone]

## 2022-07-07 NOTE — Patient Instructions (Addendum)
Start Ajovy as prevention once monthly. Next depending on insurance would try Emgality. As needed Ubrelvy: Please take one tablet at the onset of your headache. If it does not improve the symptoms please take one additional tablet 2 hours. Next would try Nurtec.  Ubrogepant Tablets What is this medication? UBROGEPANT (ue BROE je pant) treats migraines. It works by blocking a substance in the body that causes migraines. It is not used to prevent migraines. This medicine may be used for other purposes; ask your health care provider or pharmacist if you have questions. COMMON BRAND NAME(S): Roselyn Meier What should I tell my care team before I take this medication? They need to know if you have any of these conditions: Kidney disease Liver disease An unusual or allergic reaction to ubrogepant, other medications, foods, dyes, or preservatives Pregnant or trying to get pregnant Breast-feeding How should I use this medication? Take this medication by mouth with a glass of water. Take it as directed on the prescription label. You can take it with or without food. If it upsets your stomach, take it with food. Keep taking it unless your care team tells you to stop. Talk to your care team about the use of this medication in children. Special care may be needed. Overdosage: If you think you have taken too much of this medicine contact a poison control center or emergency room at once. NOTE: This medicine is only for you. Do not share this medicine with others. What if I miss a dose? This does not apply. This medication is not for regular use. What may interact with this medication? Do not take this medication with any of the following: Adagrasib Ceritinib Certain antibiotics, such as chloramphenicol, clarithromycin, telithromycin Certain antivirals for HIV, such as atazanavir, cobicistat, darunavir, delavirdine, fosamprenavir, indinavir, ritonavir Certain medications for fungal infections, such as  itraconazole, ketoconazole, posaconazole, voriconazole Conivaptan Grapefruit Idelalisib Mifepristone Nefazodone Ribociclib This medication may also interact with the following: Carvedilol Certain medications for seizures, such as phenobarbital, phenytoin Ciprofloxacin Cyclosporine Eltrombopag Fluconazole Fluvoxamine Quinidine Rifampin St. John's wort Verapamil This list may not describe all possible interactions. Give your health care provider a list of all the medicines, herbs, non-prescription drugs, or dietary supplements you use. Also tell them if you smoke, drink alcohol, or use illegal drugs. Some items may interact with your medicine. What should I watch for while using this medication? Visit your care team for regular checks on your progress. Tell your care team if your symptoms do not start to get better or if they get worse. Your mouth may get dry. Chewing sugarless gum or sucking hard candy and drinking plenty of water may help. Contact your care team if the problem does not go away or is severe. What side effects may I notice from receiving this medication? Side effects that you should report to your care team as soon as possible: Allergic reactions--skin rash, itching, hives, swelling of the face, lips, tongue, or throat Side effects that usually do not require medical attention (report to your care team if they continue or are bothersome): Drowsiness Dry mouth Fatigue Nausea This list may not describe all possible side effects. Call your doctor for medical advice about side effects. You may report side effects to FDA at 1-800-FDA-1088. Where should I keep my medication? Keep out of the reach of children and pets. Store between 15 and 30 degrees C (59 and 86 degrees F). Get rid of any unused medication after the expiration date. To get rid of  medications that are no longer needed or have expired: Take the medication to a medication take-back program. Check with your  pharmacy or law enforcement to find a location. If you cannot return the medication, check the label or package insert to see if the medication should be thrown out in the garbage or flushed down the toilet. If you are not sure, ask your care team. If it is safe to put it in the trash, pour the medication out of the container. Mix the medication with cat litter, dirt, coffee grounds, or other unwanted substance. Seal the mixture in a bag or container. Put it in the trash. NOTE: This sheet is a summary. It may not cover all possible information. If you have questions about this medicine, talk to your doctor, pharmacist, or health care provider.  2023 Elsevier/Gold Standard (2021-10-13 00:00:00) Rolanda Lundborg Injection What is this medication? FREMANEZUMAB (fre ma NEZ ue mab) prevents migraines. It works by blocking a substance in the body that causes migraines. It is a monoclonal antibody. This medicine may be used for other purposes; ask your health care provider or pharmacist if you have questions. COMMON BRAND NAME(S): AJOVY What should I tell my care team before I take this medication? They need to know if you have any of these conditions: An unusual or allergic reaction to fremanezumab, other medications, foods, dyes, or preservatives Pregnant or trying to get pregnant Breast-feeding How should I use this medication? This medication is injected under the skin. You will be taught how to prepare and give it. Take it as directed on the prescription label. Keep taking it unless your care team tells you to stop. It is important that you put your used needles and syringes in a special sharps container. Do not put them in a trash can. If you do not have a sharps container, call your pharmacist or care team to get one. Talk to your care team about the use of this medication in children. Special care may be needed. Overdosage: If you think you have taken too much of this medicine contact a poison  control center or emergency room at once. NOTE: This medicine is only for you. Do not share this medicine with others. What if I miss a dose? If you miss a dose, take it as soon as you can. If it is almost time for your next dose, take only that dose. Do not take double or extra doses. What may interact with this medication? Interactions are not expected. This list may not describe all possible interactions. Give your health care provider a list of all the medicines, herbs, non-prescription drugs, or dietary supplements you use. Also tell them if you smoke, drink alcohol, or use illegal drugs. Some items may interact with your medicine. What should I watch for while using this medication? Tell your care team if your symptoms do not start to get better or if they get worse. What side effects may I notice from receiving this medication? Side effects that you should report to your care team as soon as possible: Allergic reactions or angioedema--skin rash, itching or hives, swelling of the face, eyes, lips, tongue, arms, or legs, trouble swallowing or breathing Side effects that usually do not require medical attention (report to your care team if they continue or are bothersome): Pain, redness, or irritation at injection site This list may not describe all possible side effects. Call your doctor for medical advice about side effects. You may report side effects to FDA at  1-800-FDA-1088. Where should I keep my medication? Keep out of the reach of children and pets. Store in a refrigerator or at room temperature between 20 and 25 degrees C (68 and 77 degrees F). Refrigeration (preferred): Store in the refrigerator. Do not freeze. Keep in the original container until you are ready to take it. Remove the dose from the carton about 30 minutes before it is time for you to use it. If the dose is not used, it may be stored in the original container at room temperature for 7 days. Get rid of any unused  medication after the expiration date. Room Temperature: This medication may be stored at room temperature for up to 7 days. Keep it in the original container. Protect from light until time of use. If it is stored at room temperature, get rid of any unused medication after 7 days or after it expires, whichever is first. To get rid of medications that are no longer needed or have expired: Take the medication to a medication take-back program. Check with your pharmacy or law enforcement to find a location. If you cannot return the medication, ask your pharmacist or care team how to get rid of this medication safely. NOTE: This sheet is a summary. It may not cover all possible information. If you have questions about this medicine, talk to your doctor, pharmacist, or health care provider.  2023 Elsevier/Gold Standard (2017-05-23 00:00:00)

## 2022-07-07 NOTE — Progress Notes (Signed)
_0  ID: Sandra Miller, female    DOB: Oct 11, 1975, 46 y.o.   MRN: [id number]  Chief Complaint  Patient presents with   Hypertension    Pt states she is having issues with her blood pressure being too high     Referring provider: Ladell Pier, MD   HPI  Sandra Miller is a 46 y.o. female who presents for chronic ds management Her concerns today include:  Patient with history of HTN, HL, prediabetes, obesity, tob dep, RLS, DVT of the RT IJV and surrounding IJ port 04/2020 GERD, hidradenitis suppurativa, breast CA left dx 02/2020 (ERP s/p left mastectomy, neoadjuvant chemotherapy and adjuvant XRT; Dr. Lindi Adie).  Vulvar cancer (dx 01/2020), HSV infection, genitial warts, bipolar disorder/dep/anx (Dr. [name] Schilder)  Patient presents today for elevated blood pressure.  She has been having headaches and does have an appointment with neurology scheduled today.  She is currently only taking 10 mg of lisinopril daily.  We will trial lisinopril HCTZ combo.  Patient will need to follow-up with her PCP for this.  Pressure was slightly elevated in office today. Denies f/c/s, n/v/d, hemoptysis, PND, leg swelling Denies chest pain or edema     Allergies  Allergen Reactions   Dilaudid [Hydromorphone Hcl] Other (See Comments)    Pt became confused, pulled out iv's, does not remember anything   Aspirin Hives    States able to tolerate Goody Powders and Ibuprofen without any problem    Depakote [Divalproex Sodium] Nausea And Vomiting   Minocycline Hives    Immunization History  Administered Date(s) Administered   Influenza, Seasonal, Injecte, Preservative Fre 05/07/2019   Influenza,inj,Quad PF,6+ Mos 05/09/2013, 05/20/2014, 08/04/2020, 08/06/2021   MMR 12/24/2015   PFIZER Comirnaty(Gray Top)Covid-19 Tri-Sucrose Vaccine 01/12/2021   PFIZER(Purple Top)SARS-COV-2 Vaccination 08/25/2019, 09/16/2019, 09/16/2020   PNEUMOCOCCAL CONJUGATE-20 09/27/2021   PPD Test 05/07/2013, 04/29/2014, 04/27/2015,  04/01/2016, 09/27/2021   Td 03/14/2005   Tdap 12/02/2012    Past Medical History:  Diagnosis Date   Arthritis    Bipolar 1 disorder ([place])    Cancer ([place])    vulva, and breast   Complication of anesthesia    wakes up during procedures   Depression    GAD (generalized anxiety disorder)    Genital HSV    currently per pt  no break out 03-22-2016    GERD (gastroesophageal reflux disease)    Graves disease    Hiatal hernia    History of cervical dysplasia    2012 laser ablation   History of esophageal dilatation    for dysphasia -- x2 dilated   History of gastric ulcer    History of Helicobacter pylori infection    remote hx   History of hidradenitis suppurativa    "gets all over body intermittantly"     History of hypertension    no issue since stopped drinking alcohol 2014   History of kidney stones    History of panic attacks    History of radiation therapy 03/16/2020-05/08/2020   vulva  Dr Gery Pray   History of radiation therapy 09/09/2020-10/23/2020   left chest wall/left SCV   Dr Gery Pray   Hypertension    Iron deficiency anemia    Left ureteral stone    OCD (obsessive compulsive disorder)    PONV (postoperative nausea and vomiting)    Pre-diabetes    PTSD (post-traumatic stress disorder)    Recovering alcoholic in remission ([place])    since 2014   RLS (restless legs syndrome)  Smokers' cough ([place])    Thyroid disease    Urgency of urination    Yeast infection involving the vagina and surrounding area    secondary to taking antibiotic    Tobacco History: Social History   Tobacco Use  Smoking Status Every Day   Packs/day: 0.50   Years: 22.00   Total pack years: 11.00   Types: Cigarettes  Smokeless Tobacco Never  Tobacco Comments   Recently started a smoking cessation class.    Ready to quit: Not Answered Counseling given: Not Answered Tobacco comments: Recently started a smoking cessation class.    Outpatient Encounter Medications as of  07/07/2022  Medication Sig   acetaminophen (TYLENOL) 500 MG tablet Take 1 tablet (500 mg total) by mouth every 6 (six) hours as needed.   cetirizine (ZYRTEC ALLERGY) 10 MG tablet Take 1 tablet (10 mg total) by mouth daily.   diclofenac Sodium (VOLTAREN) 1 % GEL Apply 4 grams topically 4  times daily as needed.   doxylamine, Sleep, (UNISOM) 25 MG tablet Take 25 mg by mouth at bedtime as needed for sleep.   FLUoxetine (PROZAC) 10 MG capsule Take 3 capsules (30 mg total) by mouth daily.   gabapentin (NEURONTIN) 300 MG capsule Take 1 capsule (300 mg total) by mouth daily.   ibuprofen (ADVIL) 600 MG tablet Take 1 tablet (600 mg total) by mouth every 8 (eight) hours as needed for up to 30 doses for fever, headache, mild pain or moderate pain (Inflammation). Take 1 tablet 3 times daily as needed for inflammation of upper airways and/or pain.   letrozole (FEMARA) 2.5 MG tablet Take 1 tablet (2.5 mg total) by mouth daily.   letrozole (FEMARA) 2.5 MG tablet Take 1 tablet (2.5 mg total) by mouth daily.   lisinopril-hydrochlorothiazide (ZESTORETIC) 10-12.5 MG tablet Take 1 tablet by mouth daily.   loratadine (CLARITIN) 10 MG tablet Take 10 mg by mouth daily.   LORazepam (ATIVAN) 1 MG tablet Take 1 tablet (1 mg total) by mouth 3 (three) times daily as needed for anxiety   methimazole (TAPAZOLE) 5 MG tablet Take 1 tablet (5 mg total) by mouth daily.   Multiple Vitamin (MULTIVITAMIN WITH MINERALS) TABS tablet Take 1 tablet by mouth daily.   nicotine (NICODERM CQ) 14 mg/24hr patch Place 1 patch (14 mg total) onto the skin daily.   OLANZapine (ZYPREXA) 15 MG tablet Take 1 tablet (15 mg total) by mouth at bedtime.   omeprazole (PRILOSEC) 40 MG capsule Take 1 capsule (40 mg total) by mouth in the morning and at bedtime.   ondansetron (ZOFRAN-ODT) 4 MG disintegrating tablet Take 1 tablet (4 mg total) by mouth every 8 (eight) hours as needed for nausea or vomiting.   promethazine (PHENERGAN) 25 MG tablet TAKE 1 TABLET  BY MOUTH EVERY 6 HOURS AS NEEDED FOR NAUSEA   promethazine (PHENERGAN) 25 MG tablet Take 1 tablet (25 mg total) by mouth every 6 (six) hours as needed for nausea or vomiting.   promethazine-dextromethorphan (PROMETHAZINE-DM) 6.25-15 MG/5ML syrup Take 2.5 mLs by mouth 3 (three) times daily as needed for cough.   pseudoephedrine (SUDAFED) 60 MG tablet Take 1 tablet (60 mg total) by mouth every 8 (eight) hours as needed for congestion.   rosuvastatin (CRESTOR) 10 MG tablet TAKE 1 TABLET BY MOUTH ONCE A DAY   senna-docusate (SENOKOT-S) 8.6-50 MG tablet Take 2 tablets by mouth at bedtime. For AFTER surgery only, do not take if having diarrhea   [DISCONTINUED] lisinopril (ZESTRIL) 20 MG tablet  TAKE 1/2 TABLET BY MOUTH ONCE A DAY   methimazole (TAPAZOLE) 10 MG tablet Take 1 tablet (10 mg total) by mouth 2 (two) times daily. (Patient not taking: Reported on 06/09/2022)   potassium chloride SA (KLOR-CON M) 20 MEQ tablet Take 1 tablet (20 mEq total) by mouth 2 (two) times daily for 3 days.   propranolol (INDERAL) 10 MG tablet Take 1 tablet by mouth 2 (two) times daily. (Patient not taking: Reported on 06/09/2022)   [DISCONTINUED] prochlorperazine (COMPAZINE) 10 MG tablet TAKE 1 TABLET BY MOUTH EVERY 6 HOURS AS NEEDED FOR NAUSEA OR VOMITING (Patient taking differently: Take 10 mg by mouth every 6 (six) hours as needed for nausea or vomiting. )   No facility-administered encounter medications on file as of 07/07/2022.     Review of Systems  Review of Systems  Constitutional: Negative.   HENT: Negative.    Cardiovascular: Negative.   Gastrointestinal: Negative.   Allergic/Immunologic: Negative.   Neurological: Negative.   Psychiatric/Behavioral: Negative.         Physical Exam  BP (!) 148/99 (BP Location: Right Arm, Patient Position: Sitting, Cuff Size: Normal)   Pulse (!) 102   Temp 98.4 F (36.9 C) (Oral)   Ht _0  (1.651 m)   Wt 203 lb 9.6 oz (92.4 kg)   SpO2 98%   BMI 33.88 kg/m   Wt  Readings from Last 5 Encounters:  07/07/22 203 lb 9.6 oz (92.4 kg)  05/12/22 201 lb 4.8 oz (91.3 kg)  03/22/22 193 lb (87.5 kg)  02/11/22 205 lb (93 kg)  01/26/22 210 lb (95.3 kg)     Physical Exam Vitals and nursing note reviewed.  Constitutional:      General: She is not in acute distress.    Appearance: She is well-developed.  Cardiovascular:     Rate and Rhythm: Normal rate and regular rhythm.  Pulmonary:     Effort: Pulmonary effort is normal.     Breath sounds: Normal breath sounds.  Neurological:     Mental Status: She is alert and oriented to person, place, and time.      Lab Results:  CBC    Component Value Date/Time   WBC 9.4 06/25/2022 1558   RBC 4.46 06/25/2022 1558   HGB 13.1 06/25/2022 1558   HGB 11.3 (L) 07/01/2021 0926   HGB 12.9 07/09/2018 0959   HCT 38.6 06/25/2022 1558   HCT 37.2 07/09/2018 0959   PLT 268 06/25/2022 1558   PLT 296 07/01/2021 0926   PLT 356 07/09/2018 0959   MCV 86.5 06/25/2022 1558   MCV 84 07/09/2018 0959   MCH 29.4 06/25/2022 1558   MCHC 33.9 06/25/2022 1558   RDW 14.3 06/25/2022 1558   RDW 13.7 07/09/2018 0959   LYMPHSABS 1.7 07/01/2021 0926   MONOABS 0.8 07/01/2021 0926   EOSABS 0.1 07/01/2021 0926   BASOSABS 0.0 07/01/2021 0926    BMET    Component Value Date/Time   NA 141 06/25/2022 1558   NA 141 10/09/2018 1623   K 2.9 (L) 06/25/2022 1558   CL 105 06/25/2022 1558   CO2 27 06/25/2022 1558   GLUCOSE 116 (H) 06/25/2022 1558   BUN 9 06/25/2022 1558   BUN 12 10/09/2018 1623   CREATININE 0.67 06/25/2022 1558   CREATININE 0.75 04/06/2021 1112   CREATININE 1.01 11/10/2016 1012   CALCIUM 10.1 06/25/2022 1558   CALCIUM 9.8 10/20/2020 1127   GFRNONAA >60 06/25/2022 1558   GFRNONAA >60 04/06/2021 1112  GFRNONAA 69 11/10/2016 1012   GFRAA >60 06/04/2020 1408   GFRAA 79 11/10/2016 1012    BNP No results found for: "BNP"  ProBNP No results found for: "PROBNP"  Imaging: CT Angio Head W or Wo  Contrast  Result Date: 06/26/2022 CLINICAL DATA:  Initial evaluation for acute headache. EXAM: CT ANGIOGRAPHY HEAD CT VENOGRAM HEAD TECHNIQUE: Multidetector CT imaging of the head was performed using the standard protocol during bolus administration of intravenous contrast. Multiplanar CT image reconstructions and MIPs were obtained to evaluate the vascular anatomy. RADIATION DOSE REDUCTION: This exam was performed according to the departmental dose-optimization program which includes automated exposure control, adjustment of the mA and/or kV according to patient size and/or use of iterative reconstruction technique. CONTRAST:  37m OMNIPAQUE IOHEXOL 350 MG/ML SOLN COMPARISON:  None Available. FINDINGS: CT HEAD Brain: Cerebral volume within normal limits for patient age. No evidence for acute intracranial hemorrhage. No findings to suggest acute large vessel territory infarct. No mass lesion, midline shift, or mass effect. Ventricles are normal in size without evidence for hydrocephalus. No extra-axial fluid collection identified. Partially empty sella noted. Vascular: No hyperdense vessel identified. Skull: Scalp soft tissues demonstrate no acute abnormality. Calvarium intact. Sinuses/Orbits: Globes and orbital soft tissues within normal limits. Minimal ethmoidal sinus disease. Paranasal sinuses are otherwise clear. No mastoid effusion. CTA HEAD Anterior circulation: Both internal carotid arteries widely patent to the termini without stenosis. A1 segments widely patent. Normal anterior communicating artery complex. Both anterior cerebral arteries widely patent to their distal aspects without stenosis. No M1 stenosis or occlusion. Normal MCA bifurcations. Distal MCA branches well perfused and symmetric. Posterior circulation: Both V4 segments patent to the vertebrobasilar junction without stenosis. Both PICA origins patent and normal. Basilar widely patent to its distal aspect without stenosis. Superior  cerebellar arteries patent bilaterally. Both PCAs primarily supplied via the basilar and are well perfused to there distal aspects. Venous sinuses: Patent allowing for timing the contrast bolus. Anatomic variants: None significant.  No intracranial aneurysm. Review of the MIP images confirms the above findings. CT VENOGRAM Normal enhancement seen throughout the superior sagittal sinus to the torcula. Transverse and sigmoid sinuses are patent as are the jugular bulbs and visualized proximal internal jugular veins. Right transverse sinus dominant. Straight sinus, vein of Galen, and internal cerebral veins are patent. No visible cortical vein thrombosis. No dural sinus thrombosis. IMPRESSION: 1. Normal CTA of the head. No large vessel occlusion, hemodynamically significant stenosis, or other acute vascular abnormality. No aneurysm. 2. Negative CT venogram.  Evidence for dural sinus thrombosis. 3. No other acute intracranial abnormality. 4. Partially empty sella. Finding is nonspecific, but can be seen in the setting of idiopathic intracranial hypertension. Electronically Signed   By: BJeannine BogaM.D.   On: 06/26/2022 00:36   CT VENOGRAM HEAD  Result Date: 06/26/2022 CLINICAL DATA:  Initial evaluation for acute headache. EXAM: CT ANGIOGRAPHY HEAD CT VENOGRAM HEAD TECHNIQUE: Multidetector CT imaging of the head was performed using the standard protocol during bolus administration of intravenous contrast. Multiplanar CT image reconstructions and MIPs were obtained to evaluate the vascular anatomy. RADIATION DOSE REDUCTION: This exam was performed according to the departmental dose-optimization program which includes automated exposure control, adjustment of the mA and/or kV according to patient size and/or use of iterative reconstruction technique. CONTRAST:  734mOMNIPAQUE IOHEXOL 350 MG/ML SOLN COMPARISON:  None Available. FINDINGS: CT HEAD Brain: Cerebral volume within normal limits for patient age. No  evidence for acute intracranial hemorrhage. No findings to  suggest acute large vessel territory infarct. No mass lesion, midline shift, or mass effect. Ventricles are normal in size without evidence for hydrocephalus. No extra-axial fluid collection identified. Partially empty sella noted. Vascular: No hyperdense vessel identified. Skull: Scalp soft tissues demonstrate no acute abnormality. Calvarium intact. Sinuses/Orbits: Globes and orbital soft tissues within normal limits. Minimal ethmoidal sinus disease. Paranasal sinuses are otherwise clear. No mastoid effusion. CTA HEAD Anterior circulation: Both internal carotid arteries widely patent to the termini without stenosis. A1 segments widely patent. Normal anterior communicating artery complex. Both anterior cerebral arteries widely patent to their distal aspects without stenosis. No M1 stenosis or occlusion. Normal MCA bifurcations. Distal MCA branches well perfused and symmetric. Posterior circulation: Both V4 segments patent to the vertebrobasilar junction without stenosis. Both PICA origins patent and normal. Basilar widely patent to its distal aspect without stenosis. Superior cerebellar arteries patent bilaterally. Both PCAs primarily supplied via the basilar and are well perfused to there distal aspects. Venous sinuses: Patent allowing for timing the contrast bolus. Anatomic variants: None significant.  No intracranial aneurysm. Review of the MIP images confirms the above findings. CT VENOGRAM Normal enhancement seen throughout the superior sagittal sinus to the torcula. Transverse and sigmoid sinuses are patent as are the jugular bulbs and visualized proximal internal jugular veins. Right transverse sinus dominant. Straight sinus, vein of Galen, and internal cerebral veins are patent. No visible cortical vein thrombosis. No dural sinus thrombosis. IMPRESSION: 1. Normal CTA of the head. No large vessel occlusion, hemodynamically significant stenosis, or  other acute vascular abnormality. No aneurysm. 2. Negative CT venogram.  Evidence for dural sinus thrombosis. 3. No other acute intracranial abnormality. 4. Partially empty sella. Finding is nonspecific, but can be seen in the setting of idiopathic intracranial hypertension. Electronically Signed   By: Jeannine Boga M.D.   On: 06/26/2022 00:36     Assessment & Plan:   Essential hypertension - lisinopril-hydrochlorothiazide (ZESTORETIC) 10-12.5 MG tablet; Take 1 tablet by mouth daily.  Dispense: 90 tablet; Refill: 3  Follow up:  Follow up with PCP     Fenton Foy, NP 07/07/2022

## 2022-07-07 NOTE — Assessment & Plan Note (Signed)
-   lisinopril-hydrochlorothiazide (ZESTORETIC) 10-12.5 MG tablet; Take 1 tablet by mouth daily.  Dispense: 90 tablet; Refill: 3  Follow up:  Follow up with PCP

## 2022-07-07 NOTE — Patient Instructions (Signed)
1. Essential hypertension  - lisinopril-hydrochlorothiazide (ZESTORETIC) 10-12.5 MG tablet; Take 1 tablet by mouth daily.  Dispense: 90 tablet; Refill: 3  Follow up:  Follow up with PCP

## 2022-07-11 ENCOUNTER — Other Ambulatory Visit (HOSPITAL_COMMUNITY): Payer: Self-pay

## 2022-07-13 ENCOUNTER — Encounter: Payer: Self-pay | Admitting: Hematology and Oncology

## 2022-07-13 ENCOUNTER — Other Ambulatory Visit (HOSPITAL_COMMUNITY): Payer: Self-pay

## 2022-07-14 ENCOUNTER — Encounter (HOSPITAL_COMMUNITY): Payer: Self-pay

## 2022-07-14 ENCOUNTER — Other Ambulatory Visit (HOSPITAL_COMMUNITY): Payer: Self-pay

## 2022-07-14 ENCOUNTER — Encounter: Payer: Self-pay | Admitting: Hematology and Oncology

## 2022-07-17 ENCOUNTER — Other Ambulatory Visit: Payer: Self-pay | Admitting: Nurse Practitioner

## 2022-07-20 ENCOUNTER — Other Ambulatory Visit (HOSPITAL_COMMUNITY): Payer: Self-pay

## 2022-07-21 ENCOUNTER — Other Ambulatory Visit: Payer: Self-pay | Admitting: Nurse Practitioner

## 2022-07-21 ENCOUNTER — Other Ambulatory Visit (HOSPITAL_COMMUNITY): Payer: Self-pay

## 2022-07-22 ENCOUNTER — Other Ambulatory Visit (HOSPITAL_COMMUNITY): Payer: Self-pay

## 2022-07-25 ENCOUNTER — Other Ambulatory Visit (HOSPITAL_COMMUNITY): Payer: Self-pay

## 2022-07-25 ENCOUNTER — Other Ambulatory Visit: Payer: Self-pay | Admitting: Nurse Practitioner

## 2022-07-26 ENCOUNTER — Telehealth: Payer: Self-pay | Admitting: Nurse Practitioner

## 2022-07-26 ENCOUNTER — Other Ambulatory Visit: Payer: Self-pay | Admitting: Nurse Practitioner

## 2022-07-26 ENCOUNTER — Other Ambulatory Visit (HOSPITAL_COMMUNITY): Payer: Self-pay

## 2022-07-26 MED ORDER — GABAPENTIN 300 MG PO CAPS
300.0000 mg | ORAL_CAPSULE | Freq: Every day | ORAL | 0 refills | Status: DC
  Filled 2022-07-26: qty 30, 30d supply, fill #0

## 2022-07-26 NOTE — Telephone Encounter (Signed)
Patient called to request a refill on Gabapentin

## 2022-07-26 NOTE — Telephone Encounter (Signed)
Refill sent. Patient needs office visit for additional refills.

## 2022-07-27 ENCOUNTER — Other Ambulatory Visit (HOSPITAL_COMMUNITY): Payer: Self-pay

## 2022-07-29 ENCOUNTER — Other Ambulatory Visit (HOSPITAL_COMMUNITY): Payer: Self-pay

## 2022-07-29 ENCOUNTER — Encounter: Payer: Self-pay | Admitting: Hematology and Oncology

## 2022-08-01 ENCOUNTER — Ambulatory Visit
Admission: RE | Admit: 2022-08-01 | Discharge: 2022-08-01 | Disposition: A | Discharge status: Home / Self Care | Payer: PRIVATE HEALTH INSURANCE | Source: Ambulatory Visit | Attending: Radiation Oncology | Admitting: Radiation Oncology

## 2022-08-01 VITALS — BP 131/66 | HR 102 | Temp 97.7°F | Resp 18 | Ht 65.0 in | Wt 208.2 lb

## 2022-08-01 DIAGNOSIS — C519 Malignant neoplasm of vulva, unspecified: Secondary | ICD-10-CM

## 2022-08-01 NOTE — Progress Notes (Signed)
Radiation Oncology         [id number]) [phone] ________________________________  Name: Sandra Miller MRN: [id number]  Date: 08/01/2022  DOB: Jun 12, 1976  Follow-Up Visit Note  CC: Ladell Pier, MD  Nicholas Lose, MD    ICD-10-CM   1. Vulvar cancer [facility])  C51.9       Diagnosis:  Stage IB (ypT2, ypN1a) Multicentric Left Breast, Invasive Ductal Carcinoma with DCIS, ER+ / PR+ / Her2+, Grade 3   Stage IB vulvar cancer s/p definitive radiation (inoperable due to the close proximity to the anal sphincter), s/p diagnosis of HR positive left breast cancer, and deleterious mutation in BRIP1 (increased risk for ovarian cancer).    Interval Since Last Radiation: 1 year, 9 months, and 9 days   2) Radiation Treatment Dates: 09/09/2020 through 10/23/2020 Site: Left chest wall Technique: 3D Total Dose (Gy): 50/50 Dose per Fx (Gy): 2 Completed Fx: 25/25 Beam Energies: 6X, 10X   Site: Left SCV Technique: 3D Total Dose (Gy): 50/50 Dose per Fx (Gy): 2 Completed Fx: 25/25 Beam Energies: 6X, 10X   Site: Left chest wall boost Technique: Electron Total Dose (Gy): 10/10 Dose per Fx (Gy): 2 Completed Fx: 5/5 Beam Energies: 6E  1) Radiation Treatment Dates: 03/16/2020 through 05/08/2020 Site Technique Total Dose (Gy) Dose per Fx (Gy) Completed Fx Beam Energies  Vulva: Pelvis IMRT 45/45 1.8 25/25 6X  Vulva: Pelvis_Bst IMRT 16.2/16.2 1.8 9/9 6X   Narrative:  The patient returns today for routine follow-up, she was last seen here for follow-up on 12/23/21. Since her last visit, the patient has had multiple hospital encounters, detailed as follows.  -- ED 02/11/22: Patient presented with abdominal pain, vomiting, diarrhea, and muscle pain. Given relatively normal labs, lack of persistent abdominal pain, resolution of cramping with fluids, and resolution of nausea with zofran, she was discharged with zofran and instructed to follow-up with her PCP as needed if her symptoms return.  --  Urgent Care  06/17/22: Patient presented with acute onset persistent throat pain, painful swallowing, sinus congestion, and dry cough. Given clear cardiopulmonary exam and stable work up, imaging was differed, and she was advised to seek OTC symptomatic relief for acute viral syndrome.          -- Urgent Care 06/24/22: Patient presented to the ED with a 3 day history if intense headaches with light sensitivity. On arrival, she was found to be hypertensive and with elevated heart rate. For pain management she was given ketorolac, and clonidine to bring down her BP.     -- ED 06/25/22: Patient presented with the cc of migraine. Given persistence and return of headache after headache cocktail was given, a CTA and CTV of the head were ordered for further evaluation. CTA and CTV of the head showed normal intracranial findings other than evidence of viral sinus thrombosis.       The patient also followed up with Dr. Lindi Adie on 05/12/22. During which time, the patient endorsed ongoing issues with severe hot flashes and depression with letrozole. She otherwise denied any new concerns.        On evaluation today the patient reports ongoing problems with constipation.  She reports not having a bowel movement in past week.  Recommended she increase her MiraLAX to twice a day.  Also recommended she use a stool bulking agent.  She reports discomfort in the bilateral axillary area but does not palpate any unusual findings.  She denies any pain along the left chest wall or right  breast area.  She denies any right nipple discharge or bleeding.  She is not been using her vaginal dilator due to her multiple medical issues but wishes to resume this in the near future..  She continues to work full-time.  He denies any pain in that area but reports abdominal bloating related to her constipation.  Denies any vaginal bleeding hematuria or rectal bleeding.        Allergies:  is allergic to dilaudid [hydromorphone hcl], aspirin, depakote  [divalproex sodium], and minocycline.  Meds: Current Outpatient Medications  Medication Sig Dispense Refill   acetaminophen (TYLENOL) 500 MG tablet Take 1 tablet (500 mg total) by mouth every 6 (six) hours as needed. (Patient taking differently: Take 500 mg by mouth as needed.) 30 tablet 0   diclofenac Sodium (VOLTAREN) 1 % GEL Apply 4 grams topically 4  times daily as needed. 500 g 6   doxylamine, Sleep, (UNISOM) 25 MG tablet Take 25 mg by mouth at bedtime as needed for sleep.     FLUoxetine (PROZAC) 10 MG capsule Take 3 capsules (30 mg total) by mouth daily. 90 capsule 2   Fremanezumab-vfrm (AJOVY) 225 MG/1.5ML SOAJ Inject 225 mg into the skin every 30 (thirty) days. 4.5 mL 0   Fremanezumab-vfrm (AJOVY) 225 MG/1.5ML SOAJ Inject 225 mg into the skin every 30 (thirty) days. 1.5 mL 11   gabapentin (NEURONTIN) 300 MG capsule Take 1 capsule (300 mg total) by mouth daily. Pt needs office visit. 30 capsule 0   letrozole (FEMARA) 2.5 MG tablet Take 1 tablet (2.5 mg total) by mouth daily. 90 tablet 3   letrozole (FEMARA) 2.5 MG tablet Take 1 tablet (2.5 mg total) by mouth daily. 90 tablet 3   lisinopril-hydrochlorothiazide (ZESTORETIC) 10-12.5 MG tablet Take 1 tablet by mouth daily. 90 tablet 3   loratadine (CLARITIN) 10 MG tablet Take 10 mg by mouth daily.     LORazepam (ATIVAN) 1 MG tablet Take 1 tablet (1 mg total) by mouth 3 (three) times daily as needed for anxiety 90 tablet 1   methimazole (TAPAZOLE) 10 MG tablet Take 1 tablet (10 mg total) by mouth 2 (two) times daily. 60 tablet 4   methimazole (TAPAZOLE) 5 MG tablet Take 1 tablet (5 mg total) by mouth daily. 90 tablet 1   Multiple Vitamin (MULTIVITAMIN WITH MINERALS) TABS tablet Take 1 tablet by mouth daily.     OLANZapine (ZYPREXA) 15 MG tablet Take 1 tablet (15 mg total) by mouth at bedtime. 30 tablet 2   omeprazole (PRILOSEC) 40 MG capsule Take 1 capsule (40 mg total) by mouth in the morning and at bedtime. 60 capsule 3   ondansetron  (ZOFRAN-ODT) 4 MG disintegrating tablet Take 1 tablet (4 mg total) by mouth every 8 (eight) hours as needed for nausea or vomiting. 10 tablet 0   promethazine (PHENERGAN) 25 MG tablet TAKE 1 TABLET BY MOUTH EVERY 6 HOURS AS NEEDED FOR NAUSEA 30 tablet 6   promethazine (PHENERGAN) 25 MG tablet Take 1 tablet (25 mg total) by mouth every 6 (six) hours as needed for nausea or vomiting. 14 tablet 0   propranolol (INDERAL) 10 MG tablet Take 1 tablet by mouth 2 (two) times daily. (Patient taking differently: Take 10 mg by mouth as needed.) 180 tablet 1   rosuvastatin (CRESTOR) 10 MG tablet TAKE 1 TABLET BY MOUTH ONCE A DAY 90 tablet 3   Ubrogepant (UBRELVY) 100 MG TABS Take 100 mg by mouth every 2 (two) hours as needed. Maximum 283m  a day. 6 tablet 0   cetirizine (ZYRTEC ALLERGY) 10 MG tablet Take 1 tablet (10 mg total) by mouth daily. (Patient not taking: Reported on 08/01/2022) 30 tablet 0   nicotine (NICODERM CQ) 14 mg/24hr patch Place 1 patch (14 mg total) onto the skin daily. (Patient not taking: Reported on 08/01/2022) 28 patch 0   potassium chloride SA (KLOR-CON M) 20 MEQ tablet Take 1 tablet (20 mEq total) by mouth 2 (two) times daily for 3 days. 6 tablet 0   promethazine-dextromethorphan (PROMETHAZINE-DM) 6.25-15 MG/5ML syrup Take 2.5 mLs by mouth 3 (three) times daily as needed for cough. (Patient not taking: Reported on 08/01/2022) 100 mL 0   pseudoephedrine (SUDAFED) 60 MG tablet Take 1 tablet (60 mg total) by mouth every 8 (eight) hours as needed for congestion. (Patient not taking: Reported on 08/01/2022) 30 tablet 0   senna-docusate (SENOKOT-S) 8.6-50 MG tablet Take 2 tablets by mouth at bedtime. For AFTER surgery only, do not take if having diarrhea (Patient not taking: Reported on 08/01/2022) 30 tablet 0   No current facility-administered medications for this encounter.    Physical Findings: The patient is in no acute distress. Patient is alert and oriented.  height is _0  (1.651 m)  and weight is 208 lb 3.2 oz (94.4 kg). Her temperature is 97.7 F (36.5 C). Her blood pressure is 131/66 and her pulse is 102 (abnormal). Her respiration is 18. .   Lungs are clear to auscultation bilaterally. Heart has regular rate and rhythm. No palpable cervical, supraclavicular, or axillary adenopathy. Abdomen soft, non-tender, normal bowel sounds.  Examination of the left chest wall reveals mastectomy scar without palpable or visible signs of recurrence.  Minimal hyperpigmentation changes noted.  Examination of the right breast reveals no palpable mass nipple discharge or bleeding.  The inguinal areas are free of adenopathy.  Mild hyperpigmentation changes noted in the external genitalia.  No visible lesions or ulcerations.  A speculum exam is performed.  No mucosal lesions noted in the vaginal vault.  The cervix is surgically absent.  On bimanual and rectovaginal examination no pelvic masses appreciated.  Vaginal cuff intact no palpable stool noted in the lower rectal vault.   Lab Findings: Lab Results  Component Value Date   WBC 9.4 06/25/2022   HGB 13.1 06/25/2022   HCT 38.6 06/25/2022   MCV 86.5 06/25/2022   PLT 268 06/25/2022    Radiographic Findings: No results found.  Impression:  Stage IB (ypT2, ypN1a) Multicentric Left Breast, Invasive Ductal Carcinoma with DCIS, ER+ / PR+ / Her2+, Grade 3   Stage IB vulvar cancer s/p definitive radiation (inoperable due to the close proximity to the anal sphincter), s/p diagnosis of HR positive left breast cancer, and deleterious mutation in BRIP1 (increased risk for ovarian cancer).     No evidence of recurrence of the patient's breast or vulvar malignancy.  Recommended she resume use of vaginal dilator.  Plan: Routine follow-up in 6 months.  Patient will see Dr. Berline Lopes in 3 months.  She will be able to transition to 33-monthintervals in the near future.   28 minutes of total time was spent for this patient encounter, including preparation,  face-to-face counseling with the patient and coordination of care, physical exam, and documentation of the encounter. ____________________________________  JBlair Promise PhD, MD  This document serves as a record of services personally performed by JGery Pray MD. It was created on his behalf by ERoney Mans a trained medical scribe. The creation  of this record is based on the scribe's personal observations and the provider's statements to them. This document has been checked and approved by the attending provider.

## 2022-08-01 NOTE — Progress Notes (Addendum)
Sandra Miller is here today for follow up post radiation to the pelvic.  They completed their radiation on: 05/08/20   Does the patient complain of any of the following:  Pain: Yes, abdominal pain Abdominal bloating: yes Diarrhea/Constipation: Constipation, patient states she has not had a bowel movement in 1 week. Reports taking  miralax and ex-lax. Encouraged patient to take Senna as directed, patient voiced understanding.  Nausea/Vomiting: No Vaginal Discharge: No Blood in Urine or Stool: No Urinary Issues (dysuria/incomplete emptying/ incontinence/ increased frequency/urgency): No Does patient report using vaginal dilator 2-3 times a week and/or sexually active 2-3 weeks: No Post radiation skin changes: No     Breast Side: Left chest wall   They completed their radiation on: 10/23/20  Does the patient complain of any of the following: Post radiation skin issues: No Breast Tenderness: No Breast Swelling: No Lymphadema: No Range of Motion limitations: No Fatigue post radiation: Yes Appetite good/fair/poor: Good   Additional comments if applicable:  Patient reports having discomfort to bilateral axilla.   BP 131/66 (BP Location: Right Arm, Patient Position: Sitting, Cuff Size: Large)   Pulse (!) 102   Temp 97.7 F (36.5 C)   Resp 18   Ht '5\' 5"'$  (1.651 m)   Wt 208 lb 3.2 oz (94.4 kg)   BMI 34.65 kg/m

## 2022-08-02 ENCOUNTER — Telehealth: Payer: Self-pay | Admitting: *Deleted

## 2022-08-02 NOTE — Telephone Encounter (Signed)
CALLED PATIENT TO INFORM OF FU APPT. WITH DR. Woodbury ON 02-02-23 @ 4 PM, SPOKE WITH PATIENT AND SHE IS AWARE OF THIS APPT.

## 2022-08-03 ENCOUNTER — Other Ambulatory Visit (HOSPITAL_COMMUNITY): Payer: Self-pay

## 2022-08-04 ENCOUNTER — Other Ambulatory Visit (HOSPITAL_COMMUNITY): Payer: Self-pay

## 2022-08-10 ENCOUNTER — Other Ambulatory Visit (HOSPITAL_COMMUNITY): Payer: Self-pay

## 2022-08-18 ENCOUNTER — Other Ambulatory Visit (HOSPITAL_COMMUNITY): Payer: Self-pay

## 2022-08-18 ENCOUNTER — Other Ambulatory Visit: Payer: Self-pay | Admitting: Neurology

## 2022-08-18 ENCOUNTER — Encounter: Payer: Self-pay | Admitting: Internal Medicine

## 2022-08-18 ENCOUNTER — Ambulatory Visit: Payer: PRIVATE HEALTH INSURANCE | Attending: Internal Medicine | Admitting: Internal Medicine

## 2022-08-18 VITALS — BP 124/81 | HR 98 | Temp 99.0°F | Ht 65.0 in | Wt 202.0 lb

## 2022-08-18 DIAGNOSIS — I10 Essential (primary) hypertension: Secondary | ICD-10-CM

## 2022-08-18 DIAGNOSIS — E0590 Thyrotoxicosis, unspecified without thyrotoxic crisis or storm: Secondary | ICD-10-CM

## 2022-08-18 DIAGNOSIS — F411 Generalized anxiety disorder: Secondary | ICD-10-CM

## 2022-08-18 DIAGNOSIS — Z0001 Encounter for general adult medical examination with abnormal findings: Secondary | ICD-10-CM | POA: Diagnosis not present

## 2022-08-18 DIAGNOSIS — Z0000 Encounter for general adult medical examination without abnormal findings: Secondary | ICD-10-CM

## 2022-08-18 DIAGNOSIS — Z853 Personal history of malignant neoplasm of breast: Secondary | ICD-10-CM

## 2022-08-18 DIAGNOSIS — F17200 Nicotine dependence, unspecified, uncomplicated: Secondary | ICD-10-CM

## 2022-08-18 DIAGNOSIS — G43709 Chronic migraine without aura, not intractable, without status migrainosus: Secondary | ICD-10-CM

## 2022-08-18 DIAGNOSIS — R7303 Prediabetes: Secondary | ICD-10-CM

## 2022-08-18 DIAGNOSIS — E669 Obesity, unspecified: Secondary | ICD-10-CM

## 2022-08-18 DIAGNOSIS — F17210 Nicotine dependence, cigarettes, uncomplicated: Secondary | ICD-10-CM | POA: Diagnosis not present

## 2022-08-18 DIAGNOSIS — Z23 Encounter for immunization: Secondary | ICD-10-CM | POA: Diagnosis not present

## 2022-08-18 DIAGNOSIS — E782 Mixed hyperlipidemia: Secondary | ICD-10-CM | POA: Diagnosis not present

## 2022-08-18 DIAGNOSIS — F321 Major depressive disorder, single episode, moderate: Secondary | ICD-10-CM

## 2022-08-18 LAB — POCT GLYCOSYLATED HEMOGLOBIN (HGB A1C): HbA1c, POC (controlled diabetic range): 5.8 % (ref 0.0–7.0)

## 2022-08-18 LAB — GLUCOSE, POCT (MANUAL RESULT ENTRY): POC Glucose: 113 mg/dl — AB (ref 70–99)

## 2022-08-18 MED ORDER — NICOTINE POLACRILEX 2 MG MT GUM
2.0000 mg | CHEWING_GUM | OROMUCOSAL | 0 refills | Status: DC | PRN
  Filled 2022-08-18: qty 110, 15d supply, fill #0

## 2022-08-18 MED ORDER — ROSUVASTATIN CALCIUM 10 MG PO TABS
10.0000 mg | ORAL_TABLET | Freq: Every day | ORAL | 3 refills | Status: DC
  Filled 2022-08-18: qty 90, 90d supply, fill #0
  Filled 2022-11-18 – 2022-11-22 (×2): qty 30, 30d supply, fill #1
  Filled 2022-12-18: qty 30, 30d supply, fill #2
  Filled 2023-01-17: qty 30, 30d supply, fill #3
  Filled 2023-03-19: qty 30, 30d supply, fill #4

## 2022-08-18 MED ORDER — LISINOPRIL-HYDROCHLOROTHIAZIDE 10-12.5 MG PO TABS
1.0000 | ORAL_TABLET | Freq: Every day | ORAL | 3 refills | Status: DC
  Filled 2022-08-18: qty 90, 90d supply, fill #0
  Filled 2022-10-01 – 2022-10-13 (×2): qty 30, 30d supply, fill #0
  Filled 2022-11-07: qty 30, 30d supply, fill #1

## 2022-08-18 MED ORDER — NICOTINE 14 MG/24HR TD PT24
14.0000 mg | MEDICATED_PATCH | Freq: Every day | TRANSDERMAL | 0 refills | Status: DC
  Filled 2022-08-18: qty 28, 28d supply, fill #0

## 2022-08-18 NOTE — Patient Instructions (Signed)
I have sent refill on the nicotine patch for you.  I have also sent the nicotine gum. Remember to get a COVID booster if you have not had 1 in the past year.  Healthy Eating Following a healthy eating pattern may help you to achieve and maintain a healthy body weight, reduce the risk of chronic disease, and live a long and productive life. It is important to follow a healthy eating pattern at an appropriate calorie level for your body. Your nutritional needs should be met primarily through food by choosing a variety of nutrient-rich foods. What are tips for following this plan? Reading food labels Read labels and choose the following: Reduced or low sodium. Juices with 100% fruit juice. Foods with low saturated fats and high polyunsaturated and monounsaturated fats. Foods with whole grains, such as whole wheat, cracked wheat, brown rice, and wild rice. Whole grains that are fortified with folic acid. This is recommended for women who are pregnant or who want to become pregnant. Read labels and avoid the following: Foods with a lot of added sugars. These include foods that contain brown sugar, corn sweetener, corn syrup, dextrose, fructose, glucose, high-fructose corn syrup, honey, invert sugar, lactose, malt syrup, maltose, molasses, raw sugar, sucrose, trehalose, or turbinado sugar. Do not eat more than the following amounts of added sugar per day: 6 teaspoons (25 g) for women. 9 teaspoons (38 g) for men. Foods that contain processed or refined starches and grains. Refined grain products, such as white flour, degermed cornmeal, white bread, and white rice. Shopping Choose nutrient-rich snacks, such as vegetables, whole fruits, and nuts. Avoid high-calorie and high-sugar snacks, such as potato chips, fruit snacks, and candy. Use oil-based dressings and spreads on foods instead of solid fats such as butter, stick margarine, or cream cheese. Limit pre-made sauces, mixes, and "instant" products  such as flavored rice, instant noodles, and ready-made pasta. Try more plant-protein sources, such as tofu, tempeh, black beans, edamame, lentils, nuts, and seeds. Explore eating plans such as the Mediterranean diet or vegetarian diet. Cooking Use oil to saut or stir-fry foods instead of solid fats such as butter, stick margarine, or lard. Try baking, boiling, grilling, or broiling instead of frying. Remove the fatty part of meats before cooking. Steam vegetables in water or broth. Meal planning  At meals, imagine dividing your plate into fourths: One-half of your plate is fruits and vegetables. One-fourth of your plate is whole grains. One-fourth of your plate is protein, especially lean meats, poultry, eggs, tofu, beans, or nuts. Include low-fat dairy as part of your daily diet. Lifestyle Choose healthy options in all settings, including home, work, school, restaurants, or stores. Prepare your food safely: Wash your hands after handling raw meats. Keep food preparation surfaces clean by regularly washing with hot, soapy water. Keep raw meats separate from ready-to-eat foods, such as fruits and vegetables. Cook seafood, meat, poultry, and eggs to the recommended internal temperature. Store foods at safe temperatures. In general: Keep cold foods at 73F (4.4C) or below. Keep hot foods at 173F (60C) or above. Keep your freezer at [facility] (-17.8C) or below. Foods are no longer safe to eat when they have been between the temperatures of 40-173F (4.4-60C) for more than 2 hours. What foods should I eat? Fruits Aim to eat 2 cup-equivalents of fresh, canned (in natural juice), or frozen fruits each day. Examples of 1 cup-equivalent of fruit include 1 small apple, 8 large strawberries, 1 cup canned fruit,  cup dried fruit, or  1 cup 100% juice. Vegetables Aim to eat 2-3 cup-equivalents of fresh and frozen vegetables each day, including different varieties and colors. Examples of 1  cup-equivalent of vegetables include 2 medium carrots, 2 cups raw, leafy greens, 1 cup chopped vegetable (raw or cooked), or 1 medium baked potato. Grains Aim to eat 6 ounce-equivalents of whole grains each day. Examples of 1 ounce-equivalent of grains include 1 slice of bread, 1 cup ready-to-eat cereal, 3 cups popcorn, or  cup cooked rice, pasta, or cereal. Meats and other proteins Aim to eat 5-6 ounce-equivalents of protein each day. Examples of 1 ounce-equivalent of protein include 1 egg, 1/2 cup nuts or seeds, or 1 tablespoon (16 g) peanut butter. A cut of meat or fish that is the size of a deck of cards is about 3-4 ounce-equivalents. Of the protein you eat each week, try to have at least 8 ounces come from seafood. This includes salmon, trout, herring, and anchovies. Dairy Aim to eat 3 cup-equivalents of fat-free or low-fat dairy each day. Examples of 1 cup-equivalent of dairy include 1 cup (240 mL) milk, 8 ounces (250 g) yogurt, 1 ounces (44 g) natural cheese, or 1 cup (240 mL) fortified soy milk. Fats and oils Aim for about 5 teaspoons (21 g) per day. Choose monounsaturated fats, such as canola and olive oils, avocados, peanut butter, and most nuts, or polyunsaturated fats, such as sunflower, corn, and soybean oils, walnuts, pine nuts, sesame seeds, sunflower seeds, and flaxseed. Beverages Aim for six 8-oz glasses of water per day. Limit coffee to three to five 8-oz cups per day. Limit caffeinated beverages that have added calories, such as soda and energy drinks. Limit alcohol intake to no more than 1 drink a day for nonpregnant women and 2 drinks a day for men. One drink equals 12 oz of beer (355 mL), 5 oz of wine (148 mL), or 1 oz of hard liquor (44 mL). Seasoning and other foods Avoid adding excess amounts of salt to your foods. Try flavoring foods with herbs and spices instead of salt. Avoid adding sugar to foods. Try using oil-based dressings, sauces, and spreads instead of solid  fats. This information is based on general [place] nutrition guidelines. For more information, visit [employer]. Exact amounts may vary based on your nutrition needs. Summary A healthy eating plan may help you to maintain a healthy weight, reduce the risk of chronic diseases, and stay active throughout your life. Plan your meals. Make sure you eat the right portions of a variety of nutrient-rich foods. Try baking, boiling, grilling, or broiling instead of frying. Choose healthy options in all settings, including home, work, school, restaurants, or stores. This information is not intended to replace advice given to you by your health care provider. Make sure you discuss any questions you have with your health care provider. Document Revised: 02/02/2022 Document Reviewed: 04/20/2021 Elsevier Patient Education  [place].

## 2022-08-18 NOTE — Progress Notes (Signed)
Patient ID: Sandra Sandra Miller, female    DOB: 02/03/1976  MRN: [id number]  CC: Annual Exam (Physical. Requesting blood work.Sandra Sandra Miller to flu vax.)   Subjective: Sandra Sandra Miller is a 46 y.o. female who presents for annual exam Her concerns today include:  Patient with history of HTN, HL, prediabetes, obesity, tob dep, RLS, DVT of the RT IJV and surrounding IJ port 04/2020 GERD, hidradenitis suppurativa, breast CA left dx 02/2020 (ERP s/p left mastectomy, neoadjuvant chemotherapy and adjuvant XRT; Dr. Lindi Miller).  Vulvar cancer (dx 01/2020), HSV infection, genitial warts, bipolar disorder/dep/anx (Dr. [name] Sandra Miller)   HM: due for flu shot.  Thinks she had COVID booster within the past yr.  Sandra Miller check her card when she gets home.  Last MMG was 09/16/2021 at [facility].  She is due next mth.    Saw Dr. Lindi Miller her oncologist, Dr. Berline Lopes her gynecologist oncologist both within the last 6 months.  No signs of recurrence of breast cancer and vulvar cancer.  Dep/Anx:  followed by Dr.Arfeen. Currently on: Prozac 40 mg daily, Zyprexa 15 mg daily and Ativan 1 mg 3 times a day as needed.  Despite the medications, she feels that that anxiety continues to be a major issue for her. [facility] is working on trying to get her in with a Social worker.  Told that they did some hiring and person should start in the coming month.  Also Sandra Miller be starting in-person support group for pt with cancer dx.  Currently doing web group meeting but not able to do it as often due to work shifts  Graves Ds:  followed by endocrine through [place]. Currently on 5 mg Tapazole once a day.  Told to take Propranolol as needed  HTN:  currently on Lis/HCTZ 10/12.5 mg daily.  Reports compliance with taking the medication.  Not limiting salt as much as she should. Not checking BP Seen in ER 06/25/2022 for migraine  BP was elev. Saw Dr. Jaynee Eagles for migraine and started on [place]; given samples.  Both work very well; Proofreader on these  medications.  HL:  taking and tolerating Crestor  Tob dep:  "I am trying so hard to quit."  Using the patches but there are times when she has break through cravings and smokes a cigarette.  Currently on Nicotine patch 14.  Does not have nicotine gum  Obesity/PreDM Results for orders placed or performed in visit on 08/18/22  POCT glucose (manual entry)  Result Value Ref Range   POC Glucose 113 (A) 70 - 99 mg/dl  POCT glycosylated hemoglobin (Hb A1C)  Result Value Ref Range   Hemoglobin A1C     HbA1c POC (<> result, manual entry)     HbA1c, POC (prediabetic range)     HbA1c, POC (controlled diabetic range) 5.8 0.0 - 7.0 %   *Note: Due to a large number of results and/or encounters for the requested time period, some results have not been displayed. A complete set of results can be found in Results Review.  :  once thyroid stablized on Tapazole, wgh increased.  Eats out about 4 x/wk because she works a lot and then too tired to [employer].  Usually gets veggie plate but a lot of starchy veggies; not a lot of meat.  Admits that she consumes a lot of sugary drinks.  Patient Active Problem List   Diagnosis Date Noted   Chronic migraine without aura without status migrainosus, not intractable 07/07/2022   Sebaceous cyst 12/06/2021  Pain in left knee 11/03/2021   Hyperthyroidism 08/13/2021   Bipolar 1 disorder ([place]) 05/07/2021   Obesity (BMI 30.0-34.9) 05/07/2021   Hyperlipidemia 08/12/2020   Prediabetes 08/12/2020   Hypomagnesemia 08/12/2020   Mutation in BRIP1 gene 06/24/2020   Hypokalemia 06/17/2020   Dehydration    Colitis 06/16/2020   Port-A-Cath in place 03/11/2020   Malignant neoplasm of upper-inner quadrant of left breast in female, estrogen receptor positive ([place]) 02/25/2020   Vulvar cancer ([place]) 01/29/2020   Vulvar ulceration 01/23/2020   Essential hypertension 10/31/2019   Nausea and vomiting 12/27/2018   Seasonal allergic rhinitis 12/27/2018   History of ELISA positive for  HSV 10/01/2018   Restless leg syndrome 04/01/2016   Nephrolithiasis 03/08/2016   Obstructive pyelonephritis 03/08/2016   Normocytic anemia 03/08/2016   Hidradenitis suppurativa of left axilla 11/03/2015   Loss of weight 09/08/2015   Neck pain on left side 10/22/2014   Genital warts 05/20/2014   GERD (gastroesophageal reflux disease) 12/26/2012   Functional dyspepsia 12/26/2012   Tobacco abuse 08/22/2012   Herpes simplex virus (HSV) infection 02/03/2007     Current Outpatient Medications on File Prior to Visit  Medication Sig Dispense Refill   acetaminophen (TYLENOL) 500 MG tablet Take 1 tablet (500 mg total) by mouth every 6 (six) hours as needed. (Patient taking differently: Take 500 mg by mouth as needed.) 30 tablet 0   diclofenac Sodium (VOLTAREN) 1 % GEL Apply 4 grams topically 4  times daily as needed. 500 g 6   FLUoxetine (PROZAC) 10 MG capsule Take 3 capsules (30 mg total) by mouth daily. 90 capsule 2   Fremanezumab-vfrm (AJOVY) 225 MG/1.5ML SOAJ Inject 225 mg into the skin every 30 (thirty) days. 4.5 mL 0   Fremanezumab-vfrm (AJOVY) 225 MG/1.5ML SOAJ Inject 225 mg into the skin every 30 (thirty) days. 1.5 mL 11   gabapentin (NEURONTIN) 300 MG capsule Take 1 capsule (300 mg total) by mouth daily. Pt needs office visit. 30 capsule 0   letrozole (FEMARA) 2.5 MG tablet Take 1 tablet (2.5 mg total) by mouth daily. 90 tablet 3   letrozole (FEMARA) 2.5 MG tablet Take 1 tablet (2.5 mg total) by mouth daily. 90 tablet 3   lisinopril-hydrochlorothiazide (ZESTORETIC) 10-12.5 MG tablet Take 1 tablet by mouth daily. 90 tablet 3   loratadine (CLARITIN) 10 MG tablet Take 10 mg by mouth daily.     LORazepam (ATIVAN) 1 MG tablet Take 1 tablet (1 mg total) by mouth 3 (three) times daily as needed for anxiety 90 tablet 1   methimazole (TAPAZOLE) 5 MG tablet Take 1 tablet (5 mg total) by mouth daily. 90 tablet 1   Multiple Vitamin (MULTIVITAMIN WITH MINERALS) TABS tablet Take 1 tablet by mouth  daily.     nicotine (NICODERM CQ) 14 mg/24hr patch Place 1 patch (14 mg total) onto the skin daily. 28 patch 0   OLANZapine (ZYPREXA) 15 MG tablet Take 1 tablet (15 mg total) by mouth at bedtime. 30 tablet 2   omeprazole (PRILOSEC) 40 MG capsule Take 1 capsule (40 mg total) by mouth in the morning and at bedtime. 60 capsule 3   ondansetron (ZOFRAN-ODT) 4 MG disintegrating tablet Take 1 tablet (4 mg total) by mouth every 8 (eight) hours as needed for nausea or vomiting. 10 tablet 0   promethazine (PHENERGAN) 25 MG tablet TAKE 1 TABLET BY MOUTH EVERY 6 HOURS AS NEEDED FOR NAUSEA 30 tablet 6   propranolol (INDERAL) 10 MG tablet Take 1 tablet by mouth  2 (two) times daily. (Patient taking differently: Take 10 mg by mouth as needed.) 180 tablet 1   rosuvastatin (CRESTOR) 10 MG tablet TAKE 1 TABLET BY MOUTH ONCE A DAY 90 tablet 3   senna-docusate (SENOKOT-S) 8.6-50 MG tablet Take 2 tablets by mouth at bedtime. For AFTER surgery only, do not take if having diarrhea 30 tablet 0   Ubrogepant (UBRELVY) 100 MG TABS Take 100 mg by mouth every 2 (two) hours as needed. Maximum 251m a day. 6 tablet 0   methimazole (TAPAZOLE) 10 MG tablet Take 1 tablet (10 mg total) by mouth 2 (two) times daily. (Patient not taking: Reported on 08/18/2022) 60 tablet 4   pseudoephedrine (SUDAFED) 60 MG tablet Take 1 tablet (60 mg total) by mouth every 8 (eight) hours as needed for congestion. (Patient not taking: Reported on 08/01/2022) 30 tablet 0   [DISCONTINUED] prochlorperazine (COMPAZINE) 10 MG tablet TAKE 1 TABLET BY MOUTH EVERY 6 HOURS AS NEEDED FOR NAUSEA OR VOMITING (Patient taking differently: Take 10 mg by mouth every 6 (six) hours as needed for nausea or vomiting.) 30 tablet 1   No current facility-administered medications on file prior to visit.    Allergies  Allergen Reactions   Dilaudid [Hydromorphone Hcl] Other (See Comments)    Pt became confused, pulled out iv's, does not remember anything   Aspirin Hives     States able to tolerate Goody Powders and Ibuprofen without any problem    Depakote [Divalproex Sodium] Nausea And Vomiting   Minocycline Hives    Social History   Socioeconomic History   Marital status: Single    Spouse name: Not on file   Number of children: Not on file   Years of education: Not on file   Highest education level: Not on file  Occupational History   Not on file  Tobacco Use   Smoking status: Every Day    Packs/day: 0.50    Years: 22.00    Total pack years: 11.00    Types: Cigarettes   Smokeless tobacco: Never   Tobacco comments:    Recently started a smoking cessation class.   Vaping Use   Vaping Use: Never used  Substance and Sexual Activity   Alcohol use: Not Currently    Alcohol/week: 0.0 standard drinks of alcohol    Comment:  hx alcohollism  in remission since 2014   Drug use: Yes    Types: Marijuana    Comment: occas.   Sexual activity: Yes    Partners: Male    Birth control/protection: Surgical  Other Topics Concern   Not on file  Social History Narrative   Former [facility] patient.      Was on disability at one point.   Return to the workforce.  40 hours a week at [employer] 10 hours a week [place] the weekends [facility] 12 [place] a week.      Has grown children, she lives alone with a pet, continues to smoke no alcohol or drug use at this time      History of EtOH abuse and THC use.         Social Determinants of Health   Financial Resource Strain: Low Risk  (07/01/2021)   Overall Financial Resource Strain (CARDIA)    Difficulty of Paying Living Expenses: Not hard at all  Food Insecurity: No Food Insecurity (07/01/2021)   Hunger Vital Sign    Worried About Running Out of Food  in the Last Year: Never true    Coke in the Last Year: Never true  Transportation Needs: No Transportation Needs (07/01/2021)   PRAPARE - Hydrologist (Medical): No     Lack of Transportation (Non-Medical): No  Physical Activity: Inactive (07/01/2021)   Exercise Vital Sign    Days of Exercise per Week: 0 days    Minutes of Exercise per Session: 0 min  Stress: Stress Concern Present (07/01/2021)   [place]    Feeling of Stress : Rather much  Social Connections: Socially Isolated (07/01/2021)   Social Connection and Isolation Panel [NHANES]    Frequency of Communication with Friends and Family: More than three times a week    Frequency of Social Gatherings with Friends and Family: More than three times a week    Attends Religious Services: Never    Marine scientist or Organizations: No    Attends Archivist Meetings: Never    Marital Status: Never married  Intimate Partner Violence: Not At Risk (07/01/2021)   Humiliation, Afraid, Rape, and Kick questionnaire    Fear of Current or Ex-Partner: No    Emotionally Abused: No    Physically Abused: No    Sexually Abused: No    Family History  Problem Relation Age of Onset   Heart disease Father    Lung cancer Father        d. 74   Alcohol abuse Father    Heart disease Mother    Depression Mother    Anxiety disorder Mother    Drug abuse Brother    Alcohol abuse Brother    Drug abuse Brother    ADD / ADHD Brother    Colon polyps Brother    Cancer Paternal Grandfather        "stomach"   Diabetes Maternal Grandfather    Diabetes Paternal Grandmother    Kidney disease Maternal Uncle    Cirrhosis Cousin        alcoholic   Anxiety disorder Maternal Aunt    Depression Maternal Aunt    Cancer Cousin        maternal; ovarian cancer or other "female" cancer?   Lung cancer Paternal Uncle 62   Throat cancer Cousin        paternal; dx 87s   Lung cancer Cousin        paternal; dx 34s    Past Surgical History:  Procedure Laterality Date   CESAREAN SECTION  1995   w/  Bilateral Tubal Ligation   COLONOSCOPY  last one  08-09-2013   CYSTOSCOPY W/ URETERAL STENT PLACEMENT Left 03/29/2016   Procedure: CYSTOSCOPY WITH STENT REPLACEMENT;  Surgeon: Nickie Retort, MD;  [facility];  Service: Urology;  Laterality: Left;   CYSTOSCOPY WITH RETROGRADE PYELOGRAM, URETEROSCOPY AND STENT PLACEMENT Left 03/08/2016   Procedure: CYSTOSCOPY WITH  LEFT RETROGRADE PYELOGRAM, AND STENT PLACEMENT;  Surgeon: Nickie Retort, MD;  Location: [facility];  Service: Urology;  Laterality: Left;   CYSTOSCOPY/RETROGRADE/URETEROSCOPY/STONE EXTRACTION WITH BASKET Left 03/29/2016   Procedure: CYSTOSCOPY/RETROGRADE/URETEROSCOPY/STONE EXTRACTION WITH BASKET;  Surgeon: Nickie Retort, MD;  Location: [facility];  Service: Urology;  Laterality: Left;   ENDOMETRIAL ABLATION W/ NOVASURE  04-01-2010   ESOPHAGOGASTRODUODENOSCOPY  last one 08-09-2013   KNEE ARTHROSCOPY Left as teen   LASER ABLATION OF THE CERVIX  2012 approx   MASTECTOMY WITH AXILLARY LYMPH  NODE DISSECTION Left 07/27/2020   Procedure: LEFT MASTECTOMY WITH LEFT RADIOACTIVE SEED GUIDED TARGETED AXILLARY LYMPH NODE DISSECTION;  Surgeon: Alphonsa Overall, MD;  Location: [place];  Service: General;  Laterality: Left;   PORT-A-CATH REMOVAL Right 05/18/2021   Procedure: REMOVAL PORT-A-CATH;  Surgeon: Donnie Mesa, MD;  Location: [facility];  Service: General;  Laterality: Right;   PORTACATH PLACEMENT Right 03/06/2020   Procedure: INSERTION PORT-A-CATH WITH ULTRASOUND GUIDANCE;  Surgeon: Alphonsa Overall, MD;  Location: [place];  Service: General;  Laterality: Right;   ROBOTIC ASSISTED TOTAL HYSTERECTOMY WITH BILATERAL SALPINGO OOPHERECTOMY N/A 05/18/2021   Procedure: XI ROBOTIC ASSISTED TOTAL HYSTERECTOMY WITH BILATERAL SALPINGO OOPHORECTOMY;  Surgeon: Everitt Amber, MD;  Location: [facility];  Service: Gynecology;  Laterality: N/A;   TRANSTHORACIC ECHOCARDIOGRAM  05-19-2006   lvsf normal, ef 55-65%, there was mild flattening of the interventricular  septum during diastoli/  RV size at upper limits normal   TUBAL LIGATION     VULVA /PERINEUM BIOPSY N/A 05/18/2021   Procedure: VULVAR BIOPSY;  Surgeon: Everitt Amber, MD;  Location: [facility];  Service: Gynecology;  Laterality: N/A;   WISDOM TOOTH EXTRACTION  age 46 's    ROS: Review of Systems  HENT:  Negative for hearing loss.        She sees a Pharmacist, community regularly.  Currently having some dental work done.  Eyes:        Had eye exam done earlier this year and has new pair of bifocals.  However she has not been wearing them consistently.  Has a hard time getting used to them.  Vision she feels is poor without glasses.   Negative except as stated above  PHYSICAL EXAM: BP 124/81 (BP Location: Left Arm, Patient Position: Sitting, Cuff Size: Normal)   Pulse 98   Temp 99 F (37.2 C) (Oral)   Ht _0  (1.651 m)   Wt 202 lb (91.6 kg)   SpO2 98%   BMI 33.61 kg/m   Physical Exam  General appearance - alert, well appearing, middle-age obese African-American female and in no distress Mental status - normal mood, behavior, speech, dress, motor activity, and thought processes Eyes - pupils equal and reactive, extraocular eye movements intact Ears - bilateral TM's and external ear canals normal Nose - normal and patent, no erythema, discharge or polyps Mouth - mucous membranes moist, pharynx normal without lesions Neck - supple, no significant adenopathy Lymphatics - no palpable lymphadenopathy, no hepatosplenomegaly Chest - clear to auscultation, no wheezes, rales or rhonchi, symmetric air entry Heart - normal rate, regular rhythm, normal S1, S2, no murmurs, rubs, clicks or gallops Extremities - peripheral pulses normal, no pedal edema, no clubbing or cyanosis     08/18/2022    3:02 PM 07/07/2022    8:32 AM 12/06/2021    8:41 AM  Depression screen PHQ 2/9  Decreased Interest 1 0 2  Down, Depressed, Hopeless 1 0 1  PHQ - 2 Score 2 0 3  Altered sleeping 1 0 3  Tired, decreased energy 3 0 3   Change in appetite 2 0 3  Feeling bad or failure about yourself  0 0 1  Trouble concentrating 1 0 2  Moving slowly or fidgety/restless 0 0 0  Suicidal thoughts 0 0 1  PHQ-9 Score 9 0 16      08/18/2022    3:03 PM 12/06/2021    8:41 AM 08/06/2021    2:21 PM 01/23/2020    1:41 PM  GAD  7 : Generalized Anxiety Score  Nervous, Anxious, on Edge _0 Control/stop worrying _1 Worry too much - different things _2 Trouble relaxing _3 Restless 0 2 0 2  Easily annoyed or irritable _4 Afraid - awful might happen _5 Total GAD 7 Score _6 Latest Ref Rng & Units 06/25/2022    3:58 PM 02/11/2022    4:07 PM 08/06/2021    9:31 AM  CMP  Glucose 70 - 99 mg/dL 116  103    BUN 6 - 20 mg/dL 9  16    Creatinine 0.44 - 1.00 mg/dL 0.67  0.67  0.50   Sodium 135 - 145 mmol/L 141  136    Potassium 3.5 - 5.1 mmol/L 2.9  3.4    Chloride 98 - 111 mmol/L 105  100    CO2 22 - 32 mmol/L 27  21    Calcium 8.9 - 10.3 mg/dL 10.1  9.7    Total Protein 6.5 - 8.1 g/dL  8.3    Total Bilirubin 0.3 - 1.2 mg/dL  1.3    Alkaline Phos 38 - 126 U/L  85    AST 15 - 41 U/L  42    ALT 0 - 44 U/L  22     Lipid Panel     Component Value Date/Time   CHOL 172 11/16/2020 0928   TRIG 116 11/16/2020 0928   HDL 51 11/16/2020 0928   CHOLHDL 3.4 11/16/2020 0928   CHOLHDL 4.7 Ratio 06/13/2008 2303   VLDL 32 06/13/2008 2303   LDLCALC 100 (H) 11/16/2020 0928    CBC    Component Value Date/Time   WBC 9.4 06/25/2022 1558   RBC 4.46 06/25/2022 1558   HGB 13.1 06/25/2022 1558   HGB 11.3 (L) 07/01/2021 0926   HGB 12.9 07/09/2018 0959   HCT 38.6 06/25/2022 1558   HCT 37.2 07/09/2018 0959   PLT 268 06/25/2022 1558   PLT 296 07/01/2021 0926   PLT 356 07/09/2018 0959   MCV 86.5 06/25/2022 1558   MCV 84 07/09/2018 0959   MCH 29.4 06/25/2022 1558   MCHC 33.9 06/25/2022 1558   RDW 14.3 06/25/2022 1558   RDW 13.7 07/09/2018 0959   LYMPHSABS 1.7 07/01/2021 0926   MONOABS 0.8  07/01/2021 0926   EOSABS 0.1 07/01/2021 0926   BASOSABS 0.0 07/01/2021 0926    ASSESSMENT AND PLAN: 1. Annual physical exam   2. Essential hypertension Close to goal.  Continue current medication and low-salt diet. - lisinopril-hydrochlorothiazide (ZESTORETIC) 10-12.5 MG tablet; Take 1 tablet by mouth daily.  Dispense: 90 tablet; Refill: 3  3. Obesity (BMI 30.0-34.9) 4. Prediabetes Patient advised to eliminate sugary drinks from the diet, cut back on portion sizes especially of white carbohydrates, eat more white lean meat like chicken [place] and seafood instead of beef or pork and incorporate fresh fruits and vegetables into the diet daily. Encouraged her to get in some form of moderate intensity exercise at least 5 days a week for 30 minutes. - POCT glucose (manual entry) - POCT glycosylated hemoglobin (Hb A1C)  5. Mixed hyperlipidemia Due for lipid profile and LFTs.  She had mild elevation in bilirubin and AST on last liver function test. - rosuvastatin (CRESTOR) 10 MG tablet; Take 1 tablet (10 mg total) by mouth daily.  Dispense: 90  tablet; Refill: 3 - Lipid panel - Hepatic Function Panel  6. Tobacco dependence Strongly advised to quit smoking.  Commended her on her efforts so far to try to quit.  I recommend staying on the 14 mg patch for now and adding the nicotine gum.  She is agreeable to this. - nicotine (NICODERM CQ) 14 mg/24hr patch; Place 1 patch (14 mg total) onto the skin daily.  Remove old patch, fold together & discard away from children & pets.  Dispense: 28 patch; Refill: 0 - nicotine polacrilex (NICORETTE) 2 MG gum; Chew gum until it tingles.  Then park between cheek and gum until tingle is gone.  Chew to reactivate tingle up to approximately 30 minutes.  Do not exceed 9 pieces of gum per day as needed for cravings during smoking cessation.  Dispense: 110 tablet; Refill: 0  7. Hyperthyroidism Continue Tapazole 5 mg as prescribed by her endocrinologist.  8.  Moderate major depression ([place]) 9. GAD (generalized anxiety disorder) She is plugged in with behavioral health services is looking forward to doing some one-on-one counseling through the cancer center once the therapist comes on board.  10. Chronic migraine without aura without status migrainosus, not intractable Plugged in with neurologist and reports good relief with Ajovy and Ubrelvy  11. History of left breast cancer Patient plans to get her mammogram next month.  She Sandra Miller call for an appointment  13. Need for immunization against influenza - Flu Vaccine QUAD 57moIM (Fluarix, Fluzone & Alfiuria Quad PF)     Patient was given the opportunity to ask questions.  Patient verbalized understanding of the plan and was able to repeat key elements of the plan.   This documentation was completed using DRadio producer  Any transcriptional errors are unintentional.  Orders Placed This Encounter  Procedures   POCT glucose (manual entry)   POCT glycosylated hemoglobin (Hb A1C)     Requested Prescriptions    No prescriptions requested or ordered in this encounter    No follow-ups on file.  DKarle Plumber MD, FACP

## 2022-08-19 ENCOUNTER — Telehealth: Payer: Self-pay | Admitting: Nurse Practitioner

## 2022-08-19 ENCOUNTER — Other Ambulatory Visit (HOSPITAL_COMMUNITY): Payer: Self-pay

## 2022-08-19 LAB — LIPID PANEL
Chol/HDL Ratio: 3.1 ratio (ref 0.0–4.4)
Cholesterol, Total: 159 mg/dL (ref 100–199)
HDL: 51 mg/dL (ref >39)
LDL Chol Calc (NIH): 87 mg/dL (ref 0–99)
Triglycerides: 120 mg/dL (ref 0–149)
VLDL Cholesterol Cal: 21 mg/dL (ref 5–40)

## 2022-08-19 LAB — HEPATIC FUNCTION PANEL
ALT: 14 IU/L (ref 0–32)
AST: 20 IU/L (ref 0–40)
Albumin: 4.6 g/dL (ref 3.9–4.9)
Alkaline Phosphatase: 82 IU/L (ref 44–121)
Bilirubin Total: 0.3 mg/dL (ref 0.0–1.2)
Bilirubin, Direct: <0.1 mg/dL (ref 0.00–0.40)
Total Protein: 7.2 g/dL (ref 6.0–8.5)

## 2022-08-19 NOTE — Telephone Encounter (Signed)
We received a MyChart message from patient to schedule an OV for prescription refills with Nevin Bloodgood.  First available appointment is 09/29/22 at 10:00 a.m.  Patient states she will need another refill prior to that date as she will be out of her medication next week.  Please call patient and advise.  Thank you.

## 2022-08-21 ENCOUNTER — Other Ambulatory Visit: Payer: Self-pay | Admitting: Nurse Practitioner

## 2022-08-22 ENCOUNTER — Other Ambulatory Visit: Payer: Self-pay

## 2022-08-22 ENCOUNTER — Other Ambulatory Visit (HOSPITAL_COMMUNITY): Payer: Self-pay

## 2022-08-22 MED ORDER — GABAPENTIN 300 MG PO CAPS
300.0000 mg | ORAL_CAPSULE | Freq: Every day | ORAL | 0 refills | Status: DC
  Filled 2022-08-22: qty 30, 30d supply, fill #0

## 2022-08-22 NOTE — Telephone Encounter (Signed)
Refill sent to pharmacy.   

## 2022-08-23 ENCOUNTER — Telehealth: Payer: Self-pay | Admitting: *Deleted

## 2022-08-23 MED ORDER — UBRELVY 100 MG PO TABS: 100.0000 mg | ORAL_TABLET | ORAL | 0 refills | Status: DC | PRN

## 2022-08-23 NOTE — Telephone Encounter (Signed)
[place] Member ID: [id number]

## 2022-08-23 NOTE — Telephone Encounter (Signed)
LVM for patient to call back to verify insurance .

## 2022-08-24 ENCOUNTER — Other Ambulatory Visit (HOSPITAL_COMMUNITY): Payer: Self-pay

## 2022-08-24 ENCOUNTER — Encounter: Payer: Self-pay | Admitting: Hematology and Oncology

## 2022-08-24 NOTE — Telephone Encounter (Signed)
PA  Ajovy complete this am  on [facility] benefits online waiting on approval

## 2022-08-31 ENCOUNTER — Encounter: Payer: Self-pay | Admitting: Hematology and Oncology

## 2022-09-01 NOTE — Telephone Encounter (Signed)
Received a fax from [place] stating Arie Sabina has been approved through 09/04/22 (patient's employer is changing prescription drug insurance at that time). [facility] benefits # is [phone].

## 2022-09-06 ENCOUNTER — Other Ambulatory Visit (HOSPITAL_COMMUNITY): Payer: Self-pay

## 2022-09-06 ENCOUNTER — Encounter: Payer: Self-pay | Admitting: Hematology and Oncology

## 2022-09-07 ENCOUNTER — Encounter: Payer: Self-pay | Admitting: Orthopaedic Surgery

## 2022-09-07 ENCOUNTER — Ambulatory Visit (INDEPENDENT_AMBULATORY_CARE_PROVIDER_SITE_OTHER): Payer: No Typology Code available for payment source | Admitting: Orthopaedic Surgery

## 2022-09-07 ENCOUNTER — Ambulatory Visit (INDEPENDENT_AMBULATORY_CARE_PROVIDER_SITE_OTHER): Payer: No Typology Code available for payment source

## 2022-09-07 DIAGNOSIS — M170 Bilateral primary osteoarthritis of knee: Secondary | ICD-10-CM

## 2022-09-07 DIAGNOSIS — M25561 Pain in right knee: Secondary | ICD-10-CM | POA: Diagnosis not present

## 2022-09-07 MED ORDER — METHYLPREDNISOLONE ACETATE 40 MG/ML IJ SUSP
80.0000 mg | INTRAMUSCULAR | Status: AC | PRN
  Administered 2022-09-07: 80 mg via INTRA_ARTICULAR

## 2022-09-07 MED ORDER — LIDOCAINE HCL 1 % IJ SOLN
2.0000 mL | INTRAMUSCULAR | Status: AC | PRN
  Administered 2022-09-07: 2 mL

## 2022-09-07 MED ORDER — BUPIVACAINE HCL 0.25 % IJ SOLN
2.0000 mL | INTRAMUSCULAR | Status: AC | PRN
  Administered 2022-09-07: 2 mL via INTRA_ARTICULAR

## 2022-09-07 NOTE — Progress Notes (Signed)
Office Visit Note   Patient: Sandra Miller           Date of Birth: [birth year]           MRN: [id number] Visit Date: 09/07/2022              Requested by: Sandra Pier, MD [place],  Sandra Miller PCP: Sandra Pier, MD   Assessment & Plan: Visit Diagnoses:  1. Right knee pain, unspecified chronicity   2. Bilateral primary osteoarthritis of knee     Plan:  Sandra Miller is a pleasant 47 year old woman with a history of left knee pain and effusion.  She has had cortisone injections and aspirations done on that knee with success.  She comes in today complaining of right knee pain and popping.  She denies any injuries.  This has been present for about 2 weeks.  She has early degenerative changes in her right knee by x-ray.  She does not have a significant effusion today.  We discussed going forward with a cortisone injection and she would like to do this.  Would recommend he follow-up with Sandra Miller as needed.  Certainly if she just needs an another injection she can facilitate this with Sandra Miller. Follow-Up Instructions: Return if symptoms worsen or fail to improve.   Orders:  Orders Placed This Encounter  Procedures   Large Joint Inj   XR KNEE 3 VIEW RIGHT   No orders of the defined types were placed in this encounter.     Procedures: Large Joint Inj on 09/07/2022 9:05 AM Indications: pain and diagnostic evaluation Details: 25 G 1.5 in needle, anteromedial approach  Arthrogram: No  Medications: 80 mg methylPREDNISolone acetate 40 MG/ML; 2 mL lidocaine 1 %; 2 mL bupivacaine 0.25 % Outcome: tolerated well, no immediate complications Procedure, treatment alternatives, risks and benefits explained, specific risks discussed. Consent was given by the patient.      Clinical Data: No additional findings.   Subjective: Chief Complaint  Patient presents with   Right Knee - Pain    HPI Sandra Miller is a pleasant 47 year old woman who  comes in with a 2-week history of right knee pain and popping.  No injury.  She has a history of some arthritis in her left knee and has had an aspiration earlier this year with steroid injection which worked quite well for her  Review of Systems  All other systems reviewed and are negative.    Objective: Vital Signs: There were no vitals taken for this visit.  Physical Exam Constitutional:      Appearance: Normal appearance.  Pulmonary:     Effort: Pulmonary effort is normal.  Skin:    General: Skin is warm and dry.  Neurological:     General: No focal deficit present.     Mental Status: She is alert.     Ortho Exam Examination of the right knee no warmth no erythema minimal if any effusion.  She has tenderness more of the medial side of the knee than the lateral good varus valgus stability some grinding with range of motion has full range of motion and full extension.  She is neurovascularly intact compartments are soft and nontender Specialty Comments:  No specialty comments available.  Imaging: XR KNEE 3 VIEW RIGHT  Result Date: 09/07/2022 Three-view radiographs of her right knee were obtained today.  No acute osseous changes.  Fairly well-maintained joint spacing with no periarticular osteophytes however she  does have some early sclerotic changes in the medial compartment    PMFS History: Patient Active Problem List   Diagnosis Date Noted   Pain in right knee 09/07/2022   Bilateral primary osteoarthritis of knee 09/07/2022   Chronic migraine without aura without status migrainosus, not intractable 07/07/2022   Sebaceous cyst 12/06/2021   Pain in left knee 11/03/2021   Hyperthyroidism 08/13/2021   Bipolar 1 disorder (Sandra Miller) 05/07/2021   Obesity (BMI 30.0-34.9) 05/07/2021   Hyperlipidemia 08/12/2020   Prediabetes 08/12/2020   Hypomagnesemia 08/12/2020   Mutation in BRIP1 gene 06/24/2020   Hypokalemia 06/17/2020   Dehydration    Colitis 06/16/2020   Port-A-Cath in  place 03/11/2020   Malignant neoplasm of upper-inner quadrant of left breast in female, estrogen receptor positive (Sandra Miller) 02/25/2020   Vulvar cancer (Sandra Miller) 01/29/2020   Vulvar ulceration 01/23/2020   Essential hypertension 10/31/2019   Nausea and vomiting 12/27/2018   Seasonal allergic rhinitis 12/27/2018   History of ELISA positive for HSV 10/01/2018   Restless leg syndrome 04/01/2016   Nephrolithiasis 03/08/2016   Obstructive pyelonephritis 03/08/2016   Normocytic anemia 03/08/2016   Hidradenitis suppurativa of left axilla 11/03/2015   Loss of weight 09/08/2015   Neck pain on left side 10/22/2014   Genital warts 05/20/2014   GERD (gastroesophageal reflux disease) 12/26/2012   Functional dyspepsia 12/26/2012   Tobacco abuse 08/22/2012   Herpes simplex virus (HSV) infection 02/03/2007   Past Medical History:  Diagnosis Date   Arthritis    Bipolar 1 disorder (Sandra Miller)    Cancer (Sandra Miller)    vulva, and breast   Complication of anesthesia    wakes up during procedures   Depression    GAD (generalized anxiety disorder)    Genital HSV    currently per pt  no break out 03-22-2016    GERD (gastroesophageal reflux disease)    Graves disease    Hiatal hernia    History of cervical dysplasia    2012 laser ablation   History of esophageal dilatation    for dysphasia -- x2 dilated   History of gastric ulcer    History of Helicobacter pylori infection    remote hx   History of hidradenitis suppurativa    "gets all over body intermittantly"     History of hypertension    no issue since stopped drinking alcohol 2014   History of kidney stones    History of panic attacks    History of radiation therapy 03/16/2020-05/08/2020   vulva  Dr Gery Pray   History of radiation therapy 09/09/2020-10/23/2020   left chest wall/left SCV   Dr Gery Pray   Hypertension    Iron deficiency anemia    Left ureteral stone    OCD (obsessive compulsive disorder)    PONV (postoperative nausea and vomiting)     Pre-diabetes    PTSD (post-traumatic stress disorder)    Recovering alcoholic in remission (Sandra Miller)    since 2014   RLS (restless legs syndrome)    Smokers' cough (Sandra Miller)    Thyroid disease    Urgency of urination    Yeast infection involving the vagina and surrounding area    secondary to taking antibiotic    Family History  Problem Relation Age of Onset   Heart disease Father    Lung cancer Father        d. 95   Alcohol abuse Father    Heart disease Mother    Depression Mother    Anxiety disorder Mother  Drug abuse Brother    Alcohol abuse Brother    Drug abuse Brother    ADD / ADHD Brother    Colon polyps Brother    Cancer Paternal Grandfather        "stomach"   Diabetes Maternal Grandfather    Diabetes Paternal Grandmother    Kidney disease Maternal Uncle    Cirrhosis Cousin        alcoholic   Anxiety disorder Maternal Aunt    Depression Maternal Aunt    Cancer Cousin        maternal; ovarian cancer or other "female" cancer?   Lung cancer Paternal Uncle 38   Throat cancer Cousin        paternal; dx 21s   Lung cancer Cousin        paternal; dx 55s    Past Surgical History:  Procedure Laterality Date   CESAREAN SECTION  1995   w/  Bilateral Tubal Ligation   COLONOSCOPY  last one 08-09-2013   CYSTOSCOPY W/ URETERAL STENT PLACEMENT Left 03/29/2016   Procedure: CYSTOSCOPY WITH STENT REPLACEMENT;  Surgeon: Nickie Retort, MD;  Location: [facility];  Service: Urology;  Laterality: Left;   CYSTOSCOPY WITH RETROGRADE PYELOGRAM, URETEROSCOPY AND STENT PLACEMENT Left 03/08/2016   Procedure: CYSTOSCOPY WITH  LEFT RETROGRADE PYELOGRAM, AND STENT PLACEMENT;  Surgeon: Nickie Retort, MD;  Location: [facility];  Service: Urology;  Laterality: Left;   CYSTOSCOPY/RETROGRADE/URETEROSCOPY/STONE EXTRACTION WITH BASKET Left 03/29/2016   Procedure: CYSTOSCOPY/RETROGRADE/URETEROSCOPY/STONE EXTRACTION WITH BASKET;  Surgeon: Nickie Retort, MD;  Location[facility];  Service: Urology;  Laterality: Left;   ENDOMETRIAL ABLATION W/ NOVASURE  04-01-2010   ESOPHAGOGASTRODUODENOSCOPY  last one 08-09-2013   KNEE ARTHROSCOPY Left as teen   LASER ABLATION OF THE CERVIX  2012 approx   MASTECTOMY WITH AXILLARY LYMPH NODE DISSECTION Left 07/27/2020   Procedure: LEFT MASTECTOMY WITH LEFT RADIOACTIVE SEED GUIDED TARGETED AXILLARY LYMPH NODE DISSECTION;  Surgeon: Alphonsa Overall, MD;  Location: [place];  Service: General;  Laterality: Left;   PORT-A-CATH REMOVAL Right 05/18/2021   Procedure: REMOVAL PORT-A-CATH;  Surgeon: Donnie Mesa, MD;  Location: [facility];  Service: General;  Laterality: Right;   PORTACATH PLACEMENT Right 03/06/2020   Procedure: INSERTION PORT-A-CATH WITH ULTRASOUND GUIDANCE;  Surgeon: Alphonsa Overall, MD;  Location: [place];  Service: General;  Laterality: Right;   ROBOTIC ASSISTED TOTAL HYSTERECTOMY WITH BILATERAL SALPINGO OOPHERECTOMY N/A 05/18/2021   Procedure: XI ROBOTIC ASSISTED TOTAL HYSTERECTOMY WITH BILATERAL SALPINGO OOPHORECTOMY;  Surgeon: Everitt Amber, MD;  Location: [facility];  Service: Gynecology;  Laterality: N/A;   TRANSTHORACIC ECHOCARDIOGRAM  05-19-2006   lvsf normal, ef 55-65%, there was mild flattening of the interventricular septum during diastoli/  RV size at upper limits normal   TUBAL LIGATION     VULVA /PERINEUM BIOPSY N/A 05/18/2021   Procedure: VULVAR BIOPSY;  Surgeon: Everitt Amber, MD;  Location: [facility];  Service: Gynecology;  Laterality: N/A;   WISDOM TOOTH EXTRACTION  age 34 's   Social History   Occupational History   Not on file  Tobacco Use   Smoking status: Every Day    Packs/day: 0.50    Years: 22.00    Total pack years: 11.00    Types: Cigarettes   Smokeless tobacco: Never   Tobacco comments:    Recently started a smoking cessation class.   Vaping Use   Vaping Use: Never used  Substance and Sexual Activity   Alcohol use: Not  Currently    Alcohol/week: 0.0 standard drinks of alcohol     Comment:  hx alcohollism  in remission since 2014   Drug use: Yes    Types: Marijuana    Comment: occas.   Sexual activity: Yes    Partners: Male    Birth control/protection: Surgical

## 2022-09-08 ENCOUNTER — Telehealth (HOSPITAL_BASED_OUTPATIENT_CLINIC_OR_DEPARTMENT_OTHER): Payer: No Typology Code available for payment source | Admitting: Psychiatry

## 2022-09-08 ENCOUNTER — Other Ambulatory Visit (HOSPITAL_COMMUNITY): Payer: Self-pay

## 2022-09-08 ENCOUNTER — Encounter (HOSPITAL_COMMUNITY): Payer: Self-pay | Admitting: Psychiatry

## 2022-09-08 ENCOUNTER — Encounter: Payer: Self-pay | Admitting: Hematology and Oncology

## 2022-09-08 DIAGNOSIS — F319 Bipolar disorder, unspecified: Secondary | ICD-10-CM

## 2022-09-08 DIAGNOSIS — F411 Generalized anxiety disorder: Secondary | ICD-10-CM

## 2022-09-08 MED ORDER — LORAZEPAM 1 MG PO TABS
1.0000 mg | ORAL_TABLET | Freq: Three times a day (TID) | ORAL | 1 refills | Status: DC | PRN
  Filled 2022-09-08: qty 90, 30d supply, fill #0
  Filled 2022-10-20: qty 90, 30d supply, fill #1

## 2022-09-08 MED ORDER — FLUOXETINE HCL 10 MG PO CAPS
30.0000 mg | ORAL_CAPSULE | Freq: Every day | ORAL | 2 refills | Status: DC
  Filled 2022-09-08: qty 90, 30d supply, fill #0
  Filled 2022-10-20: qty 90, 30d supply, fill #1
  Filled 2022-11-18 – 2022-11-22 (×2): qty 90, 30d supply, fill #2

## 2022-09-08 MED ORDER — OLANZAPINE 15 MG PO TABS
15.0000 mg | ORAL_TABLET | Freq: Every day | ORAL | 2 refills | Status: DC
  Filled 2022-09-08: qty 30, 30d supply, fill #0
  Filled 2022-10-10: qty 30, 30d supply, fill #1
  Filled 2022-11-07: qty 30, 30d supply, fill #2

## 2022-09-08 NOTE — Progress Notes (Signed)
Virtual Visit via Telephone Note  I connected with [place] on 09/08/22 at  9:00 AM EST by telephone and verified that I am speaking with the correct person using two identifiers.  Location: Patient: Home Provider: Home Office   I discussed the limitations, risks, security and privacy concerns of performing an evaluation and management service by telephone and the availability of in person appointments. I also discussed with the patient that there may be a patient responsible charge related to this service. The patient expressed understanding and agreed to proceed.   History of Present Illness: Patient is evaluated by phone session.  She reported new health concerns in past few months.  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  She was seen in the emergency room and had a CT scan of head.  She was told some abnormality in the CT scan and now she is taking medicine for migraine.  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  She admitted some irritability at work and now back on Ativan 3 times a day to help her anxiety.  She also took few days off for mental health break.  She also reported having knee pain and received corticosteroid injection.  Patient told she may require knee replacement but her oncologist do not recommend because she has gone through a lot of surgeries recently.  Patient admitted chronic health conditions making her tired and fatigued but denies any mania, hallucination.  She does get sometimes irritable but no suicidal thoughts or homicidal thoughts.  She sleeps okay.  She had a good support from her family.  She was pleased to see her 63-year-old grandkids from the holidays.  Recently she had a blood work and she was pleased to see her numbers are good.  She is seeing cancer doctor now and will.  She must to keep the current medication.  She has no tremor or shakes or any EPS.   She is happy as she lost a few pounds since the last visit.   Past Psychiatric History:  H/O bipolar disorder, alcohol dependence and marijuana abuse.  H/O at least 6 inpatient. Tried lithium, Seroquel, Lamictal, Paxil, Celexa, Abilify, Geodon, Klonopin, Depakote, Neurontin and Cymbalta.  No H/o history of suicidal attempt.   Psychiatric Specialty Exam: Physical Exam  Review of Systems  Weight 202 lb (91.6 kg).There is no height or weight on file to calculate BMI.  General Appearance: NA  Eye Contact:  NA  Speech:  Clear and Coherent  Volume:  Normal  Mood:  Anxious  Affect:  NA  Thought Process:  Goal Directed  Orientation:  Full (Time, Place, and Person)  Thought Content:  Rumination  Suicidal Thoughts:  No  Homicidal Thoughts:  No  Memory:  Immediate;   Good Recent;   Good Remote;   Good  Judgement:  Intact  Insight:  Present  Psychomotor Activity:  NA  Concentration:  Concentration: Good and Attention Span: Good  Recall:  Good  Fund of Knowledge:  Good  Language:  Good  Akathisia:  No  Handed:  Right  AIMS (if indicated):     Assets:  Communication Skills Desire for Improvement Housing Resilience Social Support Talents/Skills Transportation  ADL's:  Intact  Cognition:  WNL  Sleep:   ok      Assessment and Plan: Bipolar disorder type I.  Generalized anxiety disorder.  Discussed chronic health issues and stress.  Her pain doctor recommended knee replacement but she is not ready.  I reviewed blood work results.  Her numbers are improving she lost a few pounds since the last visit.  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  She does not want to change the medication.  Continue olanzapine 15 mg at bedtime, Prozac 30 mg daily and Ativan 1 mg up to 3 times a day.  Recommended to call us back if she has any question or any concern.  Follow-up in 3 months.  Follow Up Instructions:    I  discussed the assessment and treatment plan with the patient. The patient was provided an opportunity to ask questions and all were answered. The patient agreed with the plan and demonstrated an understanding of the instructions.   The patient was advised to call back or seek an in-person evaluation if the symptoms worsen or if the condition fails to improve as anticipated.  Collaboration of Care: Other provider involved in patient's care AEB notes are available in epic to review.  Patient/Guardian was advised Release of Information must be obtained prior to any record release in order to collaborate their care with an outside provider. Patient/Guardian was advised if they have not already done so to contact the registration department to sign all necessary forms in order for [place] to release information regarding their care.   Consent: Patient/Guardian gives verbal consent for treatment and assignment of benefits for services provided during this visit. Patient/Guardian expressed understanding and agreed to proceed.    I provided 18 minutes of non-face-to-face time during this encounter.   Kathlee Nations, MD

## 2022-09-12 ENCOUNTER — Telehealth: Payer: Self-pay | Admitting: *Deleted

## 2022-09-12 ENCOUNTER — Telehealth: Payer: Self-pay

## 2022-09-12 ENCOUNTER — Other Ambulatory Visit (HOSPITAL_COMMUNITY): Payer: Self-pay

## 2022-09-12 NOTE — Telephone Encounter (Signed)
Called patient to inform of fu with Dr. Berline Lopes on 10-28-22- arrival time- 2:30 pm, lvm for a return call

## 2022-09-12 NOTE — Telephone Encounter (Signed)
Enid Derry from ([facility]) called office.  Pt is scheduled for a follow up on 10/28/22  at 2:45 with Dr. Berline Lopes.  Enid Derry to notify pt of appointment date and time.

## 2022-09-13 ENCOUNTER — Other Ambulatory Visit (HOSPITAL_COMMUNITY): Payer: Self-pay

## 2022-09-14 ENCOUNTER — Other Ambulatory Visit (HOSPITAL_COMMUNITY): Payer: Self-pay

## 2022-09-14 ENCOUNTER — Ambulatory Visit
Admission: EM | Admit: 2022-09-14 | Discharge: 2022-09-14 | Disposition: A | Discharge status: Home / Self Care | Payer: No Typology Code available for payment source | Attending: Urgent Care | Admitting: Urgent Care

## 2022-09-14 DIAGNOSIS — R112 Nausea with vomiting, unspecified: Secondary | ICD-10-CM | POA: Diagnosis not present

## 2022-09-14 DIAGNOSIS — A084 Viral intestinal infection, unspecified: Secondary | ICD-10-CM | POA: Diagnosis not present

## 2022-09-14 DIAGNOSIS — R197 Diarrhea, unspecified: Secondary | ICD-10-CM

## 2022-09-14 MED ORDER — ONDANSETRON 8 MG PO TBDP
8.0000 mg | ORAL_TABLET | Freq: Three times a day (TID) | ORAL | 0 refills | Status: DC | PRN
  Filled 2022-09-14: qty 20, 7d supply, fill #0

## 2022-09-14 NOTE — Discharge Instructions (Signed)
Make sure you push fluids drinking mostly water but mix it with Gatorade.  Try to eat light meals including soups, broths and soft foods, fruits.  You may use Zofran for your nausea and vomiting once every 8 hours.  Imodium can help with diarrhea but use this carefully limiting it to 1-2 times per day only if you are having a lot of diarrhea.  Please return to the clinic if symptoms worsen or you start having severe abdominal pain not helped by taking Tylenol or start having bloody stools or blood in the vomit.  

## 2022-09-14 NOTE — ED Triage Notes (Signed)
Pt c/o n/v/d x 3 days-NAD-steady gait

## 2022-09-14 NOTE — ED Provider Notes (Signed)
[facility]RG[facility]  Note:  This document was prepared using Systems analyst and may include unintentional dictation errors.  MRN: [id number] DOB: March 01, 1976  Subjective:   Sandra Miller is a 47 y.o. female presenting for 3 day history of nausea and vomiting, diarrhea. No fever, bloody stools, recent antibiotic use, hospitalizations or long distance travel.  Has not eaten raw foods, drank unfiltered water.  No history of GI disorders including Crohn's, IBS, ulcerative colitis.   No current facility-administered medications for this encounter.  Current Outpatient Medications:    acetaminophen (TYLENOL) 500 MG tablet, Take 1 tablet (500 mg total) by mouth every 6 (six) hours as needed. (Patient taking differently: Take 500 mg by mouth as needed.), Disp: 30 tablet, Rfl: 0   diclofenac Sodium (VOLTAREN) 1 % GEL, Apply 4 grams topically 4  times daily as needed., Disp: 500 g, Rfl: 6   FLUoxetine (PROZAC) 10 MG capsule, Take 3 capsules (30 mg total) by mouth daily., Disp: 90 capsule, Rfl: 2   Fremanezumab-vfrm (AJOVY) 225 MG/1.5ML SOAJ, Inject 225 mg into the skin every 30 (thirty) days., Disp: 4.5 mL, Rfl: 0   Fremanezumab-vfrm (AJOVY) 225 MG/1.5ML SOAJ, Inject 225 mg into the skin every 30 (thirty) days., Disp: 1.5 mL, Rfl: 11   gabapentin (NEURONTIN) 300 MG capsule, Take 1 capsule (300 mg total) by mouth daily. Pt needs office visit., Disp: 30 capsule, Rfl: 0   letrozole (FEMARA) 2.5 MG tablet, Take 1 tablet (2.5 mg total) by mouth daily., Disp: 90 tablet, Rfl: 3   letrozole (FEMARA) 2.5 MG tablet, Take 1 tablet (2.5 mg total) by mouth daily., Disp: 90 tablet, Rfl: 3   lisinopril-hydrochlorothiazide (ZESTORETIC) 10-12.5 MG tablet, Take 1 tablet by mouth daily., Disp: 90 tablet, Rfl: 3   loratadine (CLARITIN) 10 MG tablet, Take 10 mg by mouth daily., Disp: , Rfl:    LORazepam (ATIVAN) 1 MG tablet, Take 1 tablet (1 mg total) by mouth 3 (three) times daily as  needed for anxiety, Disp: 90 tablet, Rfl: 1   methimazole (TAPAZOLE) 10 MG tablet, Take 1 tablet (10 mg total) by mouth 2 (two) times daily. (Patient not taking: Reported on 08/18/2022), Disp: 60 tablet, Rfl: 4   methimazole (TAPAZOLE) 5 MG tablet, Take 1 tablet (5 mg total) by mouth daily., Disp: 90 tablet, Rfl: 1   Multiple Vitamin (MULTIVITAMIN WITH MINERALS) TABS tablet, Take 1 tablet by mouth daily., Disp: , Rfl:    nicotine (NICODERM CQ) 14 mg/24hr patch, Place 1 patch (14 mg total) onto the skin daily.  Remove old patch, fold together & discard away from children & pets., Disp: 28 patch, Rfl: 0   nicotine polacrilex (NICORETTE) 2 MG gum, Chew gum until it tingles.  Then park between cheek and gum until tingle is gone.  Chew to reactivate tingle up to approximately 30 minutes.  Do not exceed 9 pieces of gum per day as needed for cravings during smoking cessation., Disp: 110 tablet, Rfl: 0   OLANZapine (ZYPREXA) 15 MG tablet, Take 1 tablet (15 mg total) by mouth at bedtime., Disp: 30 tablet, Rfl: 2   omeprazole (PRILOSEC) 40 MG capsule, Take 1 capsule (40 mg total) by mouth in the morning and at bedtime., Disp: 60 capsule, Rfl: 3   ondansetron (ZOFRAN-ODT) 4 MG disintegrating tablet, Take 1 tablet (4 mg total) by mouth every 8 (eight) hours as needed for nausea or vomiting., Disp: 10 tablet, Rfl: 0   promethazine (PHENERGAN) 25 MG tablet,  TAKE 1 TABLET BY MOUTH EVERY 6 HOURS AS NEEDED FOR NAUSEA, Disp: 30 tablet, Rfl: 6   propranolol (INDERAL) 10 MG tablet, Take 1 tablet by mouth 2 (two) times daily. (Patient taking differently: Take 10 mg by mouth as needed.), Disp: 180 tablet, Rfl: 1   pseudoephedrine (SUDAFED) 60 MG tablet, Take 1 tablet (60 mg total) by mouth every 8 (eight) hours as needed for congestion. (Patient not taking: Reported on 08/01/2022), Disp: 30 tablet, Rfl: 0   rosuvastatin (CRESTOR) 10 MG tablet, Take 1 tablet (10 mg total) by mouth daily., Disp: 90 tablet, Rfl: 3    senna-docusate (SENOKOT-S) 8.6-50 MG tablet, Take 2 tablets by mouth at bedtime. For AFTER surgery only, do not take if having diarrhea, Disp: 30 tablet, Rfl: 0   Ubrogepant (UBRELVY) 100 MG TABS, Take 100 mg by mouth every 2 (two) hours as needed. Maximum '200mg'$  a day., Disp: 6 tablet, Rfl: 0   Allergies  Allergen Reactions   Dilaudid [Hydromorphone Hcl] Other (See Comments)    Pt became confused, pulled out iv's, does not remember anything   Aspirin Hives    States able to tolerate Goody Powders and Ibuprofen without any problem    Depakote [Divalproex Sodium] Nausea And Vomiting   Minocycline Hives    Past Medical History:  Diagnosis Date   Arthritis    Bipolar 1 disorder (HCC)    Cancer ([place])    vulva, and breast   Complication of anesthesia    wakes up during procedures   Depression    GAD (generalized anxiety disorder)    Genital HSV    currently per pt  no break out 03-22-2016    GERD (gastroesophageal reflux disease)    Graves disease    Hiatal hernia    History of cervical dysplasia    2012 laser ablation   History of esophageal dilatation    for dysphasia -- x2 dilated   History of gastric ulcer    History of Helicobacter pylori infection    remote hx   History of hidradenitis suppurativa    "gets all over body intermittantly"     History of hypertension    no issue since stopped drinking alcohol 2014   History of kidney stones    History of panic attacks    History of radiation therapy 03/16/2020-05/08/2020   vulva  Dr Gery Pray   History of radiation therapy 09/09/2020-10/23/2020   left chest wall/left SCV   Dr Gery Pray   Hypertension    Iron deficiency anemia    Left ureteral stone    OCD (obsessive compulsive disorder)    PONV (postoperative nausea and vomiting)    Pre-diabetes    PTSD (post-traumatic stress disorder)    Recovering alcoholic in remission ([place])    since 2014   RLS (restless legs syndrome)    Smokers' cough ([place])    Thyroid disease     Urgency of urination    Yeast infection involving the vagina and surrounding area    secondary to taking antibiotic     Past Surgical History:  Procedure Laterality Date   [place]   w/  Bilateral Tubal Ligation   COLONOSCOPY  last one 08-09-2013   CYSTOSCOPY W/ URETERAL STENT PLACEMENT Left 03/29/2016   Procedure: CYSTOSCOPY WITH STENT REPLACEMENT;  Surgeon: Nickie Retort, MD;  Location: [facility];  Service: Urology;  Laterality: Left;   CYSTOSCOPY WITH RETROGRADE PYELOGRAM, URETEROSCOPY AND STENT PLACEMENT Left 03/08/2016  Procedure: CYSTOSCOPY WITH  LEFT RETROGRADE PYELOGRAM, AND STENT PLACEMENT;  Surgeon: Nickie Retort, MD;  Location: [facility];  Service: Urology;  Laterality: Left;   CYSTOSCOPY/RETROGRADE/URETEROSCOPY/STONE EXTRACTION WITH BASKET Left 03/29/2016   Procedure: CYSTOSCOPY/RETROGRADE/URETEROSCOPY/STONE EXTRACTION WITH BASKET;  Surgeon: Nickie Retort, MD;  Location: [facility];  Service: Urology;  Laterality: Left;   ENDOMETRIAL ABLATION W/ NOVASURE  04-01-2010   ESOPHAGOGASTRODUODENOSCOPY  last one 08-09-2013   KNEE ARTHROSCOPY Left as teen   LASER ABLATION OF THE CERVIX  2012 approx   MASTECTOMY WITH AXILLARY LYMPH NODE DISSECTION Left 07/27/2020   Procedure: LEFT MASTECTOMY WITH LEFT RADIOACTIVE SEED GUIDED TARGETED AXILLARY LYMPH NODE DISSECTION;  Surgeon: Alphonsa Overall, MD;  Location: [facility];  Service: General;  Laterality: Left;   PORT-A-CATH REMOVAL Right 05/18/2021   Procedure: REMOVAL PORT-A-CATH;  Surgeon: Donnie Mesa, MD;  Location: [facility];  Service: General;  Laterality: Right;   PORTACATH PLACEMENT Right 03/06/2020   Procedure: INSERTION PORT-A-CATH WITH ULTRASOUND GUIDANCE;  Surgeon: Alphonsa Overall, MD;  Location: [facility];  Service: General;  Laterality: Right;   ROBOTIC ASSISTED TOTAL HYSTERECTOMY WITH BILATERAL SALPINGO OOPHERECTOMY N/A 05/18/2021   Procedure: XI ROBOTIC ASSISTED  TOTAL HYSTERECTOMY WITH BILATERAL SALPINGO OOPHORECTOMY;  Surgeon: Everitt Amber, MD;  Location: [facility];  Service: Gynecology;  Laterality: N/A;   TRANSTHORACIC ECHOCARDIOGRAM  05-19-2006   lvsf normal, ef 55-65%, there was mild flattening of the interventricular septum during diastoli/  RV size at upper limits normal   TUBAL LIGATION     VULVA /PERINEUM BIOPSY N/A 05/18/2021   Procedure: VULVAR BIOPSY;  Surgeon: Everitt Amber, MD;  Location: [facility];  Service: Gynecology;  Laterality: N/A;   WISDOM TOOTH EXTRACTION  age 23 's    Family History  Problem Relation Age of Onset   Heart disease Father    Lung cancer Father        d. 72   Alcohol abuse Father    Heart disease Mother    Depression Mother    Anxiety disorder Mother    Drug abuse Brother    Alcohol abuse Brother    Drug abuse Brother    ADD / ADHD Brother    Colon polyps Brother    Cancer Paternal Grandfather        "stomach"   Diabetes Maternal Grandfather    Diabetes Paternal Grandmother    Kidney disease Maternal Uncle    Cirrhosis Cousin        alcoholic   Anxiety disorder Maternal Aunt    Depression Maternal Aunt    Cancer Cousin        maternal; ovarian cancer or other "female" cancer?   Lung cancer Paternal Uncle 34   Throat cancer Cousin        paternal; dx 17s   Lung cancer Cousin        paternal; dx 81s    Social History   Tobacco Use   Smoking status: Every Day    Packs/day: 0.50    Years: 22.00    Total pack years: 11.00    Types: Cigarettes   Smokeless tobacco: Never   Tobacco comments:    Recently started a smoking cessation class.   Vaping Use   Vaping Use: Never used  Substance Use Topics   Alcohol use: Not Currently   Drug use: Yes    Types: Marijuana    ROS   Objective:   Vitals: BP (!) 167/105 (BP Location: Right Arm)  Pulse 87   Temp 99.8 F (37.7 C) (Oral)   Resp 17   SpO2 97%   Physical Exam Constitutional:      General: She is not in acute distress.    Appearance:  Normal appearance. She is well-developed. She is not ill-appearing, toxic-appearing or diaphoretic.  HENT:     Head: Normocephalic and atraumatic.     Nose: Nose normal.     Mouth/Throat:     Mouth: Mucous membranes are moist.  Eyes:     General: No scleral icterus.       Right eye: No discharge.        Left eye: No discharge.     Extraocular Movements: Extraocular movements intact.     Conjunctiva/sclera: Conjunctivae normal.  Cardiovascular:     Rate and Rhythm: Normal rate.  Pulmonary:     Effort: Pulmonary effort is normal.  Abdominal:     General: Bowel sounds are normal. There is no distension.     Palpations: Abdomen is soft. There is no mass.     Tenderness: There is no abdominal tenderness. There is no right CVA tenderness, left CVA tenderness, guarding or rebound.  Skin:    General: Skin is warm and dry.  Neurological:     General: No focal deficit present.     Mental Status: She is alert and oriented to person, place, and time.  Psychiatric:        Mood and Affect: Mood normal.        Behavior: Behavior normal.        Thought Content: Thought content normal.        Judgment: Judgment normal.     Assessment and Plan :   PDMP not reviewed this encounter.  1. Viral gastroenteritis   2. Nausea vomiting and diarrhea     No signs of an acute abdomen. Will manage for suspected viral gastroenteritis with supportive care.  Recommended patient hydrate well, eat light meals and maintain electrolytes.  Will use Zofran and Imodium for nausea, vomiting and diarrhea. Counseled patient on potential for adverse effects with medications prescribed/recommended today, ER and return-to-clinic precautions discussed, patient verbalized understanding.    Jaynee Eagles, [place] 09/14/22 [phone]

## 2022-09-15 ENCOUNTER — Other Ambulatory Visit (HOSPITAL_COMMUNITY): Payer: Self-pay

## 2022-09-20 ENCOUNTER — Other Ambulatory Visit: Payer: Self-pay | Admitting: Nurse Practitioner

## 2022-09-21 ENCOUNTER — Encounter: Payer: Self-pay | Admitting: Hematology and Oncology

## 2022-09-21 ENCOUNTER — Other Ambulatory Visit (HOSPITAL_COMMUNITY): Payer: Self-pay

## 2022-09-21 MED ORDER — GABAPENTIN 300 MG PO CAPS
300.0000 mg | ORAL_CAPSULE | Freq: Every day | ORAL | 0 refills | Status: DC
  Filled 2022-09-21: qty 30, 30d supply, fill #0

## 2022-09-22 ENCOUNTER — Other Ambulatory Visit: Payer: Self-pay

## 2022-09-22 ENCOUNTER — Other Ambulatory Visit (HOSPITAL_COMMUNITY): Payer: Self-pay

## 2022-09-22 MED ORDER — METHIMAZOLE 5 MG PO TABS
ORAL_TABLET | ORAL | 3 refills | Status: DC
  Filled 2022-09-22: qty 90, 90d supply, fill #0
  Filled 2022-12-18: qty 30, 30d supply, fill #1
  Filled 2023-01-17: qty 30, 30d supply, fill #2
  Filled 2023-03-19: qty 30, 30d supply, fill #3
  Filled 2023-04-17: qty 30, 30d supply, fill #4
  Filled 2023-05-17: qty 30, 30d supply, fill #5
  Filled 2023-06-16: qty 30, 30d supply, fill #6
  Filled 2023-07-15: qty 30, 30d supply, fill #7
  Filled 2023-08-14: qty 30, 30d supply, fill #8
  Filled 2023-09-13 (×2): qty 30, 30d supply, fill #9

## 2022-09-29 ENCOUNTER — Ambulatory Visit: Payer: No Typology Code available for payment source | Admitting: Nurse Practitioner

## 2022-09-29 ENCOUNTER — Other Ambulatory Visit (HOSPITAL_COMMUNITY): Payer: Self-pay

## 2022-09-29 ENCOUNTER — Encounter: Payer: Self-pay | Admitting: Nurse Practitioner

## 2022-09-29 VITALS — BP 120/78 | HR 90 | Ht 65.0 in | Wt 200.0 lb

## 2022-09-29 DIAGNOSIS — R1013 Epigastric pain: Secondary | ICD-10-CM

## 2022-09-29 MED ORDER — OMEPRAZOLE 40 MG PO CPDR
40.0000 mg | DELAYED_RELEASE_CAPSULE | Freq: Two times a day (BID) | ORAL | 5 refills | Status: DC
  Filled 2022-09-29 – 2022-10-20 (×2): qty 60, 30d supply, fill #0
  Filled 2022-11-18 – 2022-11-22 (×2): qty 60, 30d supply, fill #1
  Filled 2022-12-18: qty 60, 30d supply, fill #2
  Filled 2023-01-17: qty 60, 30d supply, fill #3
  Filled 2023-02-15: qty 60, 30d supply, fill #4
  Filled 2023-03-19: qty 60, 30d supply, fill #5

## 2022-09-29 MED ORDER — GABAPENTIN 300 MG PO CAPS
300.0000 mg | ORAL_CAPSULE | Freq: Every day | ORAL | 5 refills | Status: DC
  Filled 2022-09-29 – 2022-10-20 (×2): qty 30, 30d supply, fill #0
  Filled 2022-11-18 – 2022-11-22 (×2): qty 30, 30d supply, fill #1
  Filled 2022-12-18: qty 30, 30d supply, fill #2
  Filled 2023-01-17: qty 30, 30d supply, fill #3
  Filled 2023-02-15: qty 30, 30d supply, fill #4
  Filled 2023-03-19: qty 30, 30d supply, fill #5

## 2022-09-29 NOTE — Patient Instructions (Signed)
_______________________________________________________  If your blood pressure at your visit was 140/90 or greater, please contact your primary care physician to follow up on this.  _______________________________________________________  If you are age 49 or older, your body mass index should be between 23-30. Your Body mass index is 33.28 kg/m. If this is out of the aforementioned range listed, please consider follow up with your Primary Care Provider.  If you are age 69 or younger, your body mass index should be between 19-25. Your Body mass index is 33.28 kg/m. If this is out of the aformentioned range listed, please consider follow up with your Primary Care Provider.   ________________________________________________________  The [facility] providers would like to encourage you to use [facility] to communicate with providers for non-urgent requests or questions.  Due to long hold times on the telephone, sending your provider a message by [facility] may be a faster and more efficient way to get a response.  Please allow 48 business hours for a response.  Please remember that this is for non-urgent requests.  _______________________________________________________  We have sent the following medications to your pharmacy for you to pick up at your convenience:  Omeprazole , Gabapentin   Follow up in 1 year .

## 2022-09-29 NOTE — Progress Notes (Signed)
Assessment    Patient profile:  Sandra Miller is a 47 y.o. female known to Dr. Carlean Purl with a past medical history of chronic abdominal pain/functional dyspepsia, remote H. pylori infection , generalized anxiety disorder, OCD, PTSD, alcoholism (in remission), kidney stones, breast cancer, vulvar cancer , Graves' disease. See PMH /PSH for additional history  # Chronic dyspepsia / reflux. Asymptomatic on daily gabapentin and Omeprazole BID.  She is dependent on high-dose PPI, gets symptomatic taking only once a day  # Colon cancer screening.  Normal colonoscopy in December 2014.  Due for another colon cancer screening December 2024  Plan   Will refill omeprazole 40 mg twice daily #60 with 5 additional refills.  Take on an empty stomach Will refill gabapentin 300 mg #30 to take once daily, 5 additional refills.   HPI    Chief complaint: Med refill   Sandra Miller was last seen March 2022.  She is followed by Dr. Carlean Purl.  She has a history of functional dyspepsia / reflux.  Dr. Carlean Purl has her on BID PPI and gabapentin  Interval History:  Sandra Miller is here for med refills.  She is doing well.  No abdominal pain or reflux symptoms on twice daily omeprazole and once daily gabapentin.  She is unable to reduce PPI to once daily without getting recurrent symptoms. Her weight is stable   Previous GI Evaluation   December 2014 colonoscopy with random biopsies -Normal -Colon biopsies normal  December 2014 EGD with small bowel biopsies for epigastric and chest pain -normal.  -Biopsies normal  Imaging   No recent abd imaging  Labs:     Latest Ref Rng & Units 06/25/2022    3:58 PM 02/11/2022    4:07 PM 07/01/2021    9:26 AM  CBC  WBC 4.0 - 10.5 K/uL 9.4  8.5  7.1   Hemoglobin 12.0 - 15.0 g/dL 13.1  14.3  11.3   Hematocrit 36.0 - 46.0 % 38.6  42.8  34.3   Platelets 150 - 400 K/uL 268  272  296        Latest Ref Rng & Units 08/18/2022    4:05 PM 02/11/2022    4:07 PM 07/01/2021     9:26 AM  Hepatic Function  Total Protein 6.0 - 8.5 g/dL 7.2  8.3  7.6   Albumin 3.9 - 4.9 g/dL 4.6  4.4  3.9   AST 0 - 40 IU/L 20  42  27   ALT 0 - 32 IU/L '14  22  31   '$ Alk Phosphatase 44 - 121 IU/L 82  85  64   Total Bilirubin 0.0 - 1.2 mg/dL 0.3  1.3  0.6   Bilirubin, Direct 0.00 - 0.40 mg/dL <0.10       Past Medical History:  Diagnosis Date   Arthritis    Bipolar 1 disorder (HCC)    Cancer ([place])    vulva, and breast   Complication of anesthesia    wakes up during procedures   Depression    GAD (generalized anxiety disorder)    Genital HSV    currently per pt  no break out 03-22-2016    GERD (gastroesophageal reflux disease)    Graves disease    Hiatal hernia    History of cervical dysplasia    2012 laser ablation   History of esophageal dilatation    for dysphasia -- x2 dilated   History of gastric ulcer    History of Helicobacter pylori infection  remote hx   History of hidradenitis suppurativa    "gets all over body intermittantly"     History of hypertension    no issue since stopped drinking alcohol 2014   History of kidney stones    History of panic attacks    History of radiation therapy 03/16/2020-05/08/2020   vulva  Dr Gery Pray   History of radiation therapy 09/09/2020-10/23/2020   left chest wall/left SCV   Dr Gery Pray   Hypertension    Iron deficiency anemia    Left ureteral stone    OCD (obsessive compulsive disorder)    PONV (postoperative nausea and vomiting)    Pre-diabetes    PTSD (post-traumatic stress disorder)    Recovering alcoholic in remission ([place])    since 2014   RLS (restless legs syndrome)    Smokers' cough ([place])    Thyroid disease    Urgency of urination    Yeast infection involving the vagina and surrounding area    secondary to taking antibiotic    Past Surgical History:  Procedure Laterality Date   [place]   w/  Bilateral Tubal Ligation   COLONOSCOPY  last one 08-09-2013   CYSTOSCOPY W/ URETERAL STENT  PLACEMENT Left 03/29/2016   Procedure: CYSTOSCOPY WITH STENT REPLACEMENT;  Surgeon: Nickie Retort, MD;  Location: [facility];  Service: Urology;  Laterality: Left;   CYSTOSCOPY WITH RETROGRADE PYELOGRAM, URETEROSCOPY AND STENT PLACEMENT Left 03/08/2016   Procedure: CYSTOSCOPY WITH  LEFT RETROGRADE PYELOGRAM, AND STENT PLACEMENT;  Surgeon: Nickie Retort, MD;  [facility];  Service: Urology;  Laterality: Left;   CYSTOSCOPY/RETROGRADE/URETEROSCOPY/STONE EXTRACTION WITH BASKET Left 03/29/2016   Procedure: CYSTOSCOPY/RETROGRADE/URETEROSCOPY/STONE EXTRACTION WITH BASKET;  Surgeon: Nickie Retort, MD;  Location: [facility];  Service: Urology;  Laterality: Left;   ENDOMETRIAL ABLATION W/ NOVASURE  04-01-2010   ESOPHAGOGASTRODUODENOSCOPY  last one 08-09-2013   KNEE ARTHROSCOPY Left as teen   LASER ABLATION OF THE CERVIX  2012 approx   MASTECTOMY WITH AXILLARY LYMPH NODE DISSECTION Left 07/27/2020   Procedure: LEFT MASTECTOMY WITH LEFT RADIOACTIVE SEED GUIDED TARGETED AXILLARY LYMPH NODE DISSECTION;  Surgeon: Alphonsa Overall, MD;  Location: [place];  Service: General;  Laterality: Left;   PORT-A-CATH REMOVAL Right 05/18/2021   Procedure: REMOVAL PORT-A-CATH;  Surgeon: Donnie Mesa, MD;  [facility];  Service: General;  Laterality: Right;   PORTACATH PLACEMENT Right 03/06/2020   Procedure: INSERTION PORT-A-CATH WITH ULTRASOUND GUIDANCE;  Surgeon: Alphonsa Overall, MD;  Location: [place];  Service: General;  Laterality: Right;   ROBOTIC ASSISTED TOTAL HYSTERECTOMY WITH BILATERAL SALPINGO OOPHERECTOMY N/A 05/18/2021   Procedure: XI ROBOTIC ASSISTED TOTAL HYSTERECTOMY WITH BILATERAL SALPINGO OOPHORECTOMY;  Surgeon: Everitt Amber, MD;  [facility];  Service: Gynecology;  Laterality: N/A;   TRANSTHORACIC ECHOCARDIOGRAM  05-19-2006   lvsf normal, ef 55-65%, there was mild flattening of the interventricular septum during diastoli/  RV size at upper  limits normal   TUBAL LIGATION     VULVA /PERINEUM BIOPSY N/A 05/18/2021   Procedure: VULVAR BIOPSY;  Surgeon: Everitt Amber, MD;  [facility];  Service: Gynecology;  Laterality: N/A;   WISDOM TOOTH EXTRACTION  age 89 's    Current Medications, Allergies, Family History and Social History were reviewed in [employer] record.     Current Outpatient Medications  Medication Sig Dispense Refill   acetaminophen (TYLENOL) 500 MG tablet Take 1 tablet (500 mg total) by mouth every 6 (  six) hours as needed. (Patient taking differently: Take 500 mg by mouth as needed.) 30 tablet 0   diclofenac Sodium (VOLTAREN) 1 % GEL Apply 4 grams topically 4  times daily as needed. 500 g 6   FLUoxetine (PROZAC) 10 MG capsule Take 3 capsules (30 mg total) by mouth daily. 90 capsule 2   Fremanezumab-vfrm (AJOVY) 225 MG/1.5ML SOAJ Inject 225 mg into the skin every 30 (thirty) days. 4.5 mL 0   Fremanezumab-vfrm (AJOVY) 225 MG/1.5ML SOAJ Inject 225 mg into the skin every 30 (thirty) days. 1.5 mL 11   gabapentin (NEURONTIN) 300 MG capsule Take 1 capsule (300 mg total) by mouth daily. Pt needs office visit. 30 capsule 0   letrozole (FEMARA) 2.5 MG tablet Take 1 tablet (2.5 mg total) by mouth daily. 90 tablet 3   letrozole (FEMARA) 2.5 MG tablet Take 1 tablet (2.5 mg total) by mouth daily. 90 tablet 3   lisinopril-hydrochlorothiazide (ZESTORETIC) 10-12.5 MG tablet Take 1 tablet by mouth daily. 90 tablet 3   loratadine (CLARITIN) 10 MG tablet Take 10 mg by mouth daily.     LORazepam (ATIVAN) 1 MG tablet Take 1 tablet (1 mg total) by mouth 3 (three) times daily as needed for anxiety 90 tablet 1   methimazole (TAPAZOLE) 10 MG tablet Take 1 tablet (10 mg total) by mouth 2 (two) times daily. (Patient not taking: Reported on 08/18/2022) 60 tablet 4   methimazole (TAPAZOLE) 5 MG tablet Take one tablet (5 mg dose) by mouth daily. 90 tablet 3   Multiple Vitamin (MULTIVITAMIN WITH MINERALS) TABS tablet Take 1  tablet by mouth daily.     nicotine (NICODERM CQ) 14 mg/24hr patch Place 1 patch (14 mg total) onto the skin daily.  Remove old patch, fold together & discard away from children & pets. 28 patch 0   nicotine polacrilex (NICORETTE) 2 MG gum Chew gum until it tingles.  Then park between cheek and gum until tingle is gone.  Chew to reactivate tingle up to approximately 30 minutes.  Do not exceed 9 pieces of gum per day as needed for cravings during smoking cessation. 110 tablet 0   OLANZapine (ZYPREXA) 15 MG tablet Take 1 tablet (15 mg total) by mouth at bedtime. 30 tablet 2   omeprazole (PRILOSEC) 40 MG capsule Take 1 capsule (40 mg total) by mouth in the morning and at bedtime. 60 capsule 3   ondansetron (ZOFRAN-ODT) 8 MG disintegrating tablet Dissolve 1 tablet (8 mg total) by mouth every 8 (eight) hours as needed for nausea or vomiting. 20 tablet 0   promethazine (PHENERGAN) 25 MG tablet TAKE 1 TABLET BY MOUTH EVERY 6 HOURS AS NEEDED FOR NAUSEA 30 tablet 6   propranolol (INDERAL) 10 MG tablet Take 1 tablet by mouth 2 (two) times daily. (Patient taking differently: Take 10 mg by mouth as needed.) 180 tablet 1   pseudoephedrine (SUDAFED) 60 MG tablet Take 1 tablet (60 mg total) by mouth every 8 (eight) hours as needed for congestion. (Patient not taking: Reported on 08/01/2022) 30 tablet 0   rosuvastatin (CRESTOR) 10 MG tablet Take 1 tablet (10 mg total) by mouth daily. 90 tablet 3   senna-docusate (SENOKOT-S) 8.6-50 MG tablet Take 2 tablets by mouth at bedtime. For AFTER surgery only, do not take if having diarrhea 30 tablet 0   Ubrogepant (UBRELVY) 100 MG TABS Take 100 mg by mouth every 2 (two) hours as needed. Maximum '200mg'$  a day. 6 tablet 0   No current facility-administered  medications for this visit.    Review of Systems: No chest pain. No shortness of breath. No urinary complaints.    Physical Exam  Wt Readings from Last 3 Encounters:  08/18/22 202 lb (91.6 kg)  08/01/22 208 lb 3.2 oz  (94.4 kg)  07/07/22 202 lb (91.6 kg)    BP 120/78 (BP Location: Left Arm, Patient Position: Sitting, Cuff Size: Normal)   Pulse 90   Ht '5\' 5"'$  (1.651 m)   Wt 200 lb (90.7 kg)   BMI 33.28 kg/m  Constitutional:  Generally well appearing female in no acute distress. Psychiatric: Pleasant. Normal mood and affect. Behavior is normal. EENT: Pupils normal.  Conjunctivae are normal. No scleral icterus. Neck supple.  Cardiovascular: Normal rate, regular rhythm.  Pulmonary/chest: Effort normal and breath sounds normal. No wheezing, rales or rhonchi. Abdominal: Soft, nondistended, nontender. Bowel sounds active throughout. There are no masses palpable. No hepatomegaly. Neurological: Alert and oriented to person place and time. Musculoskeletal: No edema Skin: Skin is warm and dry. No rashes noted.  [name] Savoy, NP  09/29/2022, 8:30 AM

## 2022-09-30 ENCOUNTER — Other Ambulatory Visit (HOSPITAL_COMMUNITY): Payer: Self-pay

## 2022-09-30 ENCOUNTER — Ambulatory Visit
Admission: RE | Admit: 2022-09-30 | Discharge: 2022-09-30 | Disposition: A | Discharge status: Home / Self Care | Payer: No Typology Code available for payment source | Source: Ambulatory Visit | Attending: Nurse Practitioner

## 2022-09-30 VITALS — BP 151/77 | HR 92 | Temp 99.4°F | Resp 18

## 2022-09-30 DIAGNOSIS — R051 Acute cough: Secondary | ICD-10-CM | POA: Diagnosis not present

## 2022-09-30 DIAGNOSIS — R6889 Other general symptoms and signs: Secondary | ICD-10-CM | POA: Diagnosis not present

## 2022-09-30 DIAGNOSIS — F17210 Nicotine dependence, cigarettes, uncomplicated: Secondary | ICD-10-CM | POA: Diagnosis not present

## 2022-09-30 DIAGNOSIS — Z1152 Encounter for screening for COVID-19: Secondary | ICD-10-CM | POA: Diagnosis not present

## 2022-09-30 DIAGNOSIS — R110 Nausea: Secondary | ICD-10-CM | POA: Diagnosis not present

## 2022-09-30 MED ORDER — OSELTAMIVIR PHOSPHATE 75 MG PO CAPS: 75.0000 mg | ORAL_CAPSULE | Freq: Two times a day (BID) | ORAL | 0 refills | Status: DC

## 2022-09-30 NOTE — ED Provider Notes (Signed)
[facility]    CSN: [id number] Arrival date & time: 09/30/22  1517      History   Chief Complaint Chief Complaint  Patient presents with   Fatigue    Flu like symptoms.  Took a covid test today at work was negative - Entered by patient   Headache   Cough   Nasal Congestion   Nausea   Diarrhea    HPI Sandra Miller is a 47 y.o. female who presents for evaluation of URI symptoms for 2 days. Patient reports associated symptoms of cough, congestion, sore throat, body aches, fevers, nausea, diarrhea. Denies vomiting, ear pain, shortness of breath. Patient does not have a hx of asthma.  She smokes intermittently.  No known sick contacts and no recent travel. Pt is vaccinated for COVID. Pt is vaccinated for flu this season. Pt has taken theraflu OTC for symptoms.  Reports a negative COVID test at work today.  Pt has no other concerns at this time.    Headache Associated symptoms: congestion, cough, diarrhea, fever and sore throat   Cough Associated symptoms: fever, headaches and sore throat   Diarrhea Associated symptoms: fever and headaches     Past Medical History:  Diagnosis Date   Arthritis    Bipolar 1 disorder ([place])    Cancer ([place])    vulva, and breast   Complication of anesthesia    wakes up during procedures   Depression    GAD (generalized anxiety disorder)    Genital HSV    currently per pt  no break out 03-22-2016    GERD (gastroesophageal reflux disease)    Graves disease    Hiatal hernia    History of cervical dysplasia    2012 laser ablation   History of esophageal dilatation    for dysphasia -- x2 dilated   History of gastric ulcer    History of Helicobacter pylori infection    remote hx   History of hidradenitis suppurativa    "gets all over body intermittantly"     History of hypertension    no issue since stopped drinking alcohol 2014   History of kidney stones    History of panic attacks    History of radiation therapy  03/16/2020-05/08/2020   vulva  Dr Gery Pray   History of radiation therapy 09/09/2020-10/23/2020   left chest wall/left SCV   Dr Gery Pray   Hypertension    Iron deficiency anemia    Left ureteral stone    OCD (obsessive compulsive disorder)    PONV (postoperative nausea and vomiting)    Pre-diabetes    PTSD (post-traumatic stress disorder)    Recovering alcoholic in remission ([place])    since 2014   RLS (restless legs syndrome)    Smokers' cough ([place])    Thyroid disease    Urgency of urination    Yeast infection involving the vagina and surrounding area    secondary to taking antibiotic    Patient Active Problem List   Diagnosis Date Noted   Pain in right knee 09/07/2022   Bilateral primary osteoarthritis of knee 09/07/2022   Chronic migraine without aura without status migrainosus, not intractable 07/07/2022   Sebaceous cyst 12/06/2021   Pain in left knee 11/03/2021   Hyperthyroidism 08/13/2021   Bipolar 1 disorder ([place]) 05/07/2021   Obesity (BMI 30.0-34.9) 05/07/2021   Hyperlipidemia 08/12/2020   Prediabetes 08/12/2020   Hypomagnesemia 08/12/2020   Mutation in BRIP1 gene 06/24/2020   Hypokalemia 06/17/2020  Dehydration    Colitis 06/16/2020   Port-A-Cath in place 03/11/2020   Malignant neoplasm of upper-inner quadrant of left breast in female, estrogen receptor positive ([place]) 02/25/2020   Vulvar cancer ([place]) 01/29/2020   Vulvar ulceration 01/23/2020   Essential hypertension 10/31/2019   Nausea and vomiting 12/27/2018   Seasonal allergic rhinitis 12/27/2018   History of ELISA positive for HSV 10/01/2018   Restless leg syndrome 04/01/2016   Nephrolithiasis 03/08/2016   Obstructive pyelonephritis 03/08/2016   Normocytic anemia 03/08/2016   Hidradenitis suppurativa of left axilla 11/03/2015   Loss of weight 09/08/2015   Neck pain on left side 10/22/2014   Genital warts 05/20/2014   GERD (gastroesophageal reflux disease) 12/26/2012   Functional dyspepsia 12/26/2012    Tobacco abuse 08/22/2012   Herpes simplex virus (HSV) infection 02/03/2007    Past Surgical History:  Procedure Laterality Date   CESAREAN SECTION  1995   w/  Bilateral Tubal Ligation   COLONOSCOPY  last one 08-09-2013   CYSTOSCOPY W/ URETERAL STENT PLACEMENT Left 03/29/2016   Procedure: CYSTOSCOPY WITH STENT REPLACEMENT;  Surgeon: Nickie Retort, MD;  Location: [facility];  Service: Urology;  Laterality: Left;   CYSTOSCOPY WITH RETROGRADE PYELOGRAM, URETEROSCOPY AND STENT PLACEMENT Left 03/08/2016   Procedure: CYSTOSCOPY WITH  LEFT RETROGRADE PYELOGRAM, AND STENT PLACEMENT;  Surgeon: Nickie Retort, MD;  Location: [facility];  Service: Urology;  Laterality: Left;   CYSTOSCOPY/RETROGRADE/URETEROSCOPY/STONE EXTRACTION WITH BASKET Left 03/29/2016   Procedure: CYSTOSCOPY/RETROGRADE/URETEROSCOPY/STONE EXTRACTION WITH BASKET;  Surgeon: Nickie Retort, MD;  Location: [facility];  Service: Urology;  Laterality: Left;   ENDOMETRIAL ABLATION W/ NOVASURE  04-01-2010   ESOPHAGOGASTRODUODENOSCOPY  last one 08-09-2013   KNEE ARTHROSCOPY Left as teen   LASER ABLATION OF THE CERVIX  2012 approx   MASTECTOMY WITH AXILLARY LYMPH NODE DISSECTION Left 07/27/2020   Procedure: LEFT MASTECTOMY WITH LEFT RADIOACTIVE SEED GUIDED TARGETED AXILLARY LYMPH NODE DISSECTION;  Surgeon: Alphonsa Overall, MD;  Location: [facility];  Service: General;  Laterality: Left;   PORT-A-CATH REMOVAL Right 05/18/2021   Procedure: REMOVAL PORT-A-CATH;  Surgeon: Donnie Mesa, MD;  Location: [facility];  Service: General;  Laterality: Right;   PORTACATH PLACEMENT Right 03/06/2020   Procedure: INSERTION PORT-A-CATH WITH ULTRASOUND GUIDANCE;  Surgeon: Alphonsa Overall, MD;  Location: [facility];  Service: General;  Laterality: Right;   ROBOTIC ASSISTED TOTAL HYSTERECTOMY WITH BILATERAL SALPINGO OOPHERECTOMY N/A 05/18/2021   Procedure: XI ROBOTIC ASSISTED TOTAL HYSTERECTOMY WITH BILATERAL SALPINGO  OOPHORECTOMY;  Surgeon: Everitt Amber, MD;  Location: [facility];  Service: Gynecology;  Laterality: N/A;   TRANSTHORACIC ECHOCARDIOGRAM  05-19-2006   lvsf normal, ef 55-65%, there was mild flattening of the interventricular septum during diastoli/  RV size at upper limits normal   TUBAL LIGATION     VULVA /PERINEUM BIOPSY N/A 05/18/2021   Procedure: VULVAR BIOPSY;  Surgeon: Everitt Amber, MD;  Location: [facility];  Service: Gynecology;  Laterality: N/A;   WISDOM TOOTH EXTRACTION  age 13 's    OB History     Gravida  2   Para  2   Term  2   Preterm      AB      Living  2      SAB      IAB      Ectopic      Multiple      Live Births  2            Home  Medications    Prior to Admission medications   Medication Sig Start Date End Date Taking? Authorizing Provider  oseltamivir (TAMIFLU) 75 MG capsule Take 1 capsule (75 mg total) by mouth every 12 (twelve) hours. 09/30/22  Yes Melynda Ripple, NP  acetaminophen (TYLENOL) 500 MG tablet Take 1 tablet (500 mg total) by mouth every 6 (six) hours as needed. Patient taking differently: Take 500 mg by mouth as needed. 01/02/22   Talbot Grumbling, FNP  diclofenac Sodium (VOLTAREN) 1 % GEL Apply 4 grams topically 4  times daily as needed. 03/30/21   Hilts, Legrand Como, MD  diclofenac Sodium (VOLTAREN) 1 % GEL Apply topically. 03/30/21   [provider]  FLUoxetine (PROZAC) 10 MG capsule Take 3 capsules (30 mg total) by mouth daily. 09/08/22   Arfeen, Arlyce Harman, MD  Fremanezumab-vfrm (AJOVY) 225 MG/1.5ML SOAJ Inject 225 mg into the skin every 30 (thirty) days. 07/07/22   Melvenia Beam, MD  Fremanezumab-vfrm (AJOVY) 225 MG/1.5ML SOAJ Inject 225 mg into the skin every 30 (thirty) days. 07/07/22   Melvenia Beam, MD  gabapentin (NEURONTIN) 300 MG capsule Take 1 capsule (300 mg total) by mouth daily. Pt needs office visit. 09/29/22   Willia Craze, NP  letrozole [facility]) 2.5 MG tablet Take 1 tablet (2.5 mg total) by mouth daily. 10/28/21    Nicholas Lose, MD  letrozole [facility]) 2.5 MG tablet Take 1 tablet (2.5 mg total) by mouth daily. 10/29/21   Nicholas Lose, MD  lisinopril-hydrochlorothiazide (ZESTORETIC) 10-12.5 MG tablet Take 1 tablet by mouth daily. 08/18/22   Ladell Pier, MD  loratadine (CLARITIN) 10 MG tablet Take 10 mg by mouth daily.    [provider]  LORazepam (ATIVAN) 1 MG tablet Take 1 tablet (1 mg total) by mouth 3 (three) times daily as needed for anxiety 09/08/22   Arfeen, Arlyce Harman, MD  methimazole (TAPAZOLE) 10 MG tablet Take 1 tablet (10 mg total) by mouth 2 (two) times daily. 12/06/21   Ladell Pier, MD  methimazole (TAPAZOLE) 5 MG tablet Take one tablet (5 mg dose) by mouth daily. 09/22/22     Multiple Vitamin (MULTIVITAMIN WITH MINERALS) TABS tablet Take 1 tablet by mouth daily.    [provider]  nicotine (NICODERM CQ) 14 mg/24hr patch Place 1 patch (14 mg total) onto the skin daily.  Remove old patch, fold together & discard away from children & pets. 08/18/22   Ladell Pier, MD  nicotine polacrilex (NICORETTE) 2 MG gum Chew gum until it tingles.  Then park between cheek and gum until tingle is gone.  Chew to reactivate tingle up to approximately 30 minutes.  Do not exceed 9 pieces of gum per day as needed for cravings during smoking cessation. 08/18/22   Ladell Pier, MD  OLANZapine (ZYPREXA) 15 MG tablet Take 1 tablet (15 mg total) by mouth at bedtime. 09/08/22   Arfeen, Arlyce Harman, MD  omeprazole (PRILOSEC) 40 MG capsule Take 1 capsule (40 mg total) by mouth in the morning and at bedtime. 09/29/22   Willia Craze, NP  ondansetron (ZOFRAN-ODT) 8 MG disintegrating tablet Dissolve 1 tablet (8 mg total) by mouth every 8 (eight) hours as needed for nausea or vomiting. 09/14/22   Jaynee Eagles, PA-C  promethazine (PHENERGAN) 25 MG tablet TAKE 1 TABLET BY MOUTH EVERY 6 HOURS AS NEEDED FOR NAUSEA 07/26/21   Nicholas Lose, MD  propranolol (INDERAL) 10 MG tablet Take 1 tablet by mouth 2  (two) times  daily. Patient taking differently: Take 10 mg by mouth as needed. 12/06/21   Ladell Pier, MD  pseudoephedrine (SUDAFED) 60 MG tablet Take 1 tablet (60 mg total) by mouth every 8 (eight) hours as needed for congestion. 06/17/22   Jaynee Eagles, PA-C  rosuvastatin (CRESTOR) 10 MG tablet Take 1 tablet (10 mg total) by mouth daily. 08/18/22   Ladell Pier, MD  senna-docusate (SENOKOT-S) 8.6-50 MG tablet Take 2 tablets by mouth at bedtime. For AFTER surgery only, do not take if having diarrhea 02/19/21   Cross, Lenna Sciara D, NP  Ubrogepant (UBRELVY) 100 MG TABS Take 100 mg by mouth every 2 (two) hours as needed. Maximum '200mg'$  a day. 08/23/22   Melvenia Beam, MD  prochlorperazine (COMPAZINE) 10 MG tablet TAKE 1 TABLET BY MOUTH EVERY 6 HOURS AS NEEDED FOR NAUSEA OR VOMITING Patient taking differently: Take 10 mg by mouth every 6 (six) hours as needed for nausea or vomiting. 06/16/20 08/18/20  Nicholas Lose, MD    Family History Family History  Problem Relation Age of Onset   Heart disease Father    Lung cancer Father        d. 70   Alcohol abuse Father    Heart disease Mother    Depression Mother    Anxiety disorder Mother    Drug abuse Brother    Alcohol abuse Brother    Drug abuse Brother    ADD / ADHD Brother    Colon polyps Brother    Cancer Paternal Grandfather        "stomach"   Diabetes Maternal Grandfather    Diabetes Paternal Grandmother    Kidney disease Maternal Uncle    Cirrhosis Cousin        alcoholic   Anxiety disorder Maternal Aunt    Depression Maternal Aunt    Cancer Cousin        maternal; ovarian cancer or other "female" cancer?   Lung cancer Paternal Uncle 19   Throat cancer Cousin        paternal; dx 59s   Lung cancer Cousin        paternal; dx 9s    Social History Social History   Tobacco Use   Smoking status: Every Day    Packs/day: 0.50    Years: 22.00    Total pack years: 11.00    Types: Cigarettes   Smokeless tobacco: Never    Tobacco comments:    Recently started a smoking cessation class.   Vaping Use   Vaping Use: Never used  Substance Use Topics   Alcohol use: Not Currently   Drug use: Yes    Types: Marijuana     Allergies   Dilaudid [hydromorphone hcl], Aspirin, Depakote [divalproex sodium], and Minocycline   Review of Systems Review of Systems  Constitutional:  Positive for fever.  HENT:  Positive for congestion and sore throat.   Respiratory:  Positive for cough.   Gastrointestinal:  Positive for diarrhea.  Neurological:  Positive for headaches.     Physical Exam Triage Vital Signs ED Triage Vitals  Enc Vitals Group     BP 09/30/22 1527 (!) 151/77     Pulse Rate 09/30/22 1527 92     Resp 09/30/22 1527 18     Temp 09/30/22 1527 99.4 F (37.4 C)     Temp Source 09/30/22 1527 Oral     SpO2 09/30/22 1527 96 %     Weight --      Height --  Head Circumference --      Peak Flow --      Pain Score 09/30/22 1551 8     Pain Loc --      Pain Edu? --      Excl. in [place]? --    No data found.  Updated Vital Signs BP (!) 151/77 (BP Location: Right Arm)   Pulse 92   Temp 99.4 F (37.4 C) (Oral)   Resp 18   SpO2 96%   Visual Acuity Right Eye Distance:   Left Eye Distance:   Bilateral Distance:    Right Eye Near:   Left Eye Near:    Bilateral Near:     Physical Exam Vitals and nursing note reviewed.  Constitutional:      General: She is not in acute distress.    Appearance: She is well-developed. She is not ill-appearing.  HENT:     Head: Normocephalic and atraumatic.     Right Ear: Tympanic membrane and ear canal normal.     Left Ear: Tympanic membrane and ear canal normal.     Nose: Congestion present.     Mouth/Throat:     Mouth: Mucous membranes are moist.     Pharynx: Oropharynx is clear. Uvula midline. Posterior oropharyngeal erythema present.     Tonsils: No tonsillar exudate or tonsillar abscesses.  Eyes:     Conjunctiva/sclera: Conjunctivae normal.      Pupils: Pupils are equal, round, and reactive to light.  Cardiovascular:     Rate and Rhythm: Normal rate and regular rhythm.     Heart sounds: Normal heart sounds.  Pulmonary:     Effort: Pulmonary effort is normal.     Breath sounds: Normal breath sounds.  Musculoskeletal:     Cervical back: Normal range of motion and neck supple.  Lymphadenopathy:     Cervical: No cervical adenopathy.  Skin:    General: Skin is warm and dry.  Neurological:     General: No focal deficit present.     Mental Status: She is alert and oriented to person, place, and time.  Psychiatric:        Mood and Affect: Mood normal.        Behavior: Behavior normal.      UC Treatments / Results  Labs (all labs ordered are listed, but only abnormal results are displayed) Labs Reviewed  SARS CORONAVIRUS 2 (TAT 6-24 HRS)    EKG   Radiology No results found.  Procedures Procedures (including critical care time)  Medications Ordered in UC Medications - No data to display  Initial Impression / Assessment and Plan / UC Course  I have reviewed the triage vital signs and the nursing notes.  Pertinent labs & imaging results that were available during my care of the patient were reviewed by me and considered in my medical decision making (see chart for details).     Reviewed exam and symptoms with patient.  No red flags on exam. Start Tamiflu given flulike symptoms Patient will continue over-the-counter TheraFlu as needed.  Declined Rx Tessalon Rest and fluids Advised PCP follow-up 2 to 3 days for recheck ER precautions reviewed and patient verbalized understanding Final Clinical Impressions(s) / UC Diagnoses   Final diagnoses:  Acute cough  Flu-like symptoms     Discharge Instructions      Tamiflu twice daily for 5 days Continue over-the-counter TheraFlu as needed Rest and fluids The clinic will contact you if the COVID testing is positive Follow-up with your  PCP 2 to 3 days for  recheck Please go to emergency room if you have any worsening symptoms    ED Prescriptions     Medication Sig Dispense Auth. Provider   oseltamivir (TAMIFLU) 75 MG capsule Take 1 capsule (75 mg total) by mouth every 12 (twelve) hours. 10 capsule Melynda Ripple, NP      PDMP not reviewed this encounter.   Melynda Ripple, NP 09/30/22 [phone]

## 2022-09-30 NOTE — ED Triage Notes (Signed)
Pt c/o cough, congestion, headache, nausea, and diarrhea.  Started: 2 days ago   Covid test today was negative at work.  Home interventions:  theraflu

## 2022-09-30 NOTE — Discharge Instructions (Signed)
Tamiflu twice daily for 5 days Continue over-the-counter TheraFlu as needed Rest and fluids The clinic will contact you if the COVID testing is positive Follow-up with your PCP 2 to 3 days for recheck Please go to emergency room if you have any worsening symptoms

## 2022-10-01 ENCOUNTER — Other Ambulatory Visit (HOSPITAL_COMMUNITY): Payer: Self-pay

## 2022-10-01 LAB — SARS CORONAVIRUS 2 (TAT 6-24 HRS): SARS Coronavirus 2: NEGATIVE

## 2022-10-03 ENCOUNTER — Other Ambulatory Visit (HOSPITAL_COMMUNITY): Payer: Self-pay

## 2022-10-03 ENCOUNTER — Telehealth: Payer: Self-pay | Admitting: Neurology

## 2022-10-03 ENCOUNTER — Encounter: Payer: Self-pay | Admitting: Neurology

## 2022-10-03 ENCOUNTER — Telehealth (INDEPENDENT_AMBULATORY_CARE_PROVIDER_SITE_OTHER): Payer: PRIVATE HEALTH INSURANCE | Admitting: Neurology

## 2022-10-03 DIAGNOSIS — G43709 Chronic migraine without aura, not intractable, without status migrainosus: Secondary | ICD-10-CM | POA: Diagnosis not present

## 2022-10-03 DIAGNOSIS — G43109 Migraine with aura, not intractable, without status migrainosus: Secondary | ICD-10-CM | POA: Diagnosis not present

## 2022-10-03 MED ORDER — AJOVY 225 MG/1.5ML ~~LOC~~ SOAJ
225.0000 mg | SUBCUTANEOUS | 11 refills | Status: DC
  Filled 2022-10-03: qty 1.5, 30d supply, fill #0

## 2022-10-03 MED ORDER — UBRELVY 100 MG PO TABS: 100.0000 mg | ORAL_TABLET | ORAL | 11 refills | Status: DC | PRN

## 2022-10-03 NOTE — Patient Instructions (Addendum)
Continue Ajovy and Sandra Miller F/u one year  Fremanezumab Injection What is this medication? FREMANEZUMAB (fre ma NEZ ue mab) prevents migraines. It works by blocking a substance in the body that causes migraines. It is a monoclonal antibody. This medicine may be used for other purposes; ask your health care provider or pharmacist if you have questions. COMMON BRAND NAME(S): AJOVY What should I tell my care team before I take this medication? They need to know if you have any of these conditions: An unusual or allergic reaction to fremanezumab, other medications, foods, dyes, or preservatives Pregnant or trying to get pregnant Breast-feeding How should I use this medication? This medication is injected under the skin. You will be taught how to prepare and give it. Take it as directed on the prescription label. Keep taking it unless your care team tells you to stop. It is important that you put your used needles and syringes in a special sharps container. Do not put them in a trash can. If you do not have a sharps container, call your pharmacist or care team to get one. Talk to your care team about the use of this medication in children. Special care may be needed. Overdosage: If you think you have taken too much of this medicine contact a poison control center or emergency room at once. NOTE: This medicine is only for you. Do not share this medicine with others. What if I miss a dose? If you miss a dose, take it as soon as you can. If it is almost time for your next dose, take only that dose. Do not take double or extra doses. What may interact with this medication? Interactions are not expected. This list may not describe all possible interactions. Give your health care provider a list of all the medicines, herbs, non-prescription drugs, or dietary supplements you use. Also tell them if you smoke, drink alcohol, or use illegal drugs. Some items may interact with your medicine. What should I  watch for while using this medication? Tell your care team if your symptoms do not start to get better or if they get worse. What side effects may I notice from receiving this medication? Side effects that you should report to your care team as soon as possible: Allergic reactions or angioedema--skin rash, itching or hives, swelling of the face, eyes, lips, tongue, arms, or legs, trouble swallowing or breathing Side effects that usually do not require medical attention (report to your care team if they continue or are bothersome): Pain, redness, or irritation at injection site This list may not describe all possible side effects. Call your doctor for medical advice about side effects. You may report side effects to FDA at 1-800-FDA-1088. Where should I keep my medication? Keep out of the reach of children and pets. Store in a refrigerator or at room temperature between 20 and 25 degrees C (68 and 77 degrees F). Refrigeration (preferred): Store in the refrigerator. Do not freeze. Keep in the original container until you are ready to take it. Remove the dose from the carton about 30 minutes before it is time for you to use it. If the dose is not used, it may be stored in the original container at room temperature for 7 days. Get rid of any unused medication after the expiration date. Room Temperature: This medication may be stored at room temperature for up to 7 days. Keep it in the original container. Protect from light until time of use. If it is stored at  room temperature, get rid of any unused medication after 7 days or after it expires, whichever is first. To get rid of medications that are no longer needed or have expired: Take the medication to a medication take-back program. Check with your pharmacy or law enforcement to find a location. If you cannot return the medication, ask your pharmacist or care team how to get rid of this medication safely. NOTE: This sheet is a summary. It may not cover  all possible information. If you have questions about this medicine, talk to your doctor, pharmacist, or health care provider.  2023 Elsevier/Gold Standard (2017-05-23 00:00:00) Ubrogepant Tablets What is this medication? UBROGEPANT (ue BROE je pant) treats migraines. It works by blocking a substance in the body that causes migraines. It is not used to prevent migraines. This medicine may be used for other purposes; ask your health care provider or pharmacist if you have questions. COMMON BRAND NAME(S): Sandra Miller What should I tell my care team before I take this medication? They need to know if you have any of these conditions: Kidney disease Liver disease An unusual or allergic reaction to ubrogepant, other medications, foods, dyes, or preservatives Pregnant or trying to get pregnant Breast-feeding How should I use this medication? Take this medication by mouth with a glass of water. Take it as directed on the prescription label. You can take it with or without food. If it upsets your stomach, take it with food. Keep taking it unless your care team tells you to stop. Talk to your care team about the use of this medication in children. Special care may be needed. Overdosage: If you think you have taken too much of this medicine contact a poison control center or emergency room at once. NOTE: This medicine is only for you. Do not share this medicine with others. What if I miss a dose? This does not apply. This medication is not for regular use. What may interact with this medication? Do not take this medication with any of the following: Adagrasib Ceritinib Certain antibiotics, such as chloramphenicol, clarithromycin, telithromycin Certain antivirals for HIV, such as atazanavir, cobicistat, darunavir, delavirdine, fosamprenavir, indinavir, ritonavir Certain medications for fungal infections, such as itraconazole, ketoconazole, posaconazole,  voriconazole Conivaptan Grapefruit Idelalisib Mifepristone Nefazodone Ribociclib This medication may also interact with the following: Carvedilol Certain medications for seizures, such as phenobarbital, phenytoin Ciprofloxacin Cyclosporine Eltrombopag Fluconazole Fluvoxamine Quinidine Rifampin St. John's wort Verapamil This list may not describe all possible interactions. Give your health care provider a list of all the medicines, herbs, non-prescription drugs, or dietary supplements you use. Also tell them if you smoke, drink alcohol, or use illegal drugs. Some items may interact with your medicine. What should I watch for while using this medication? Visit your care team for regular checks on your progress. Tell your care team if your symptoms do not start to get better or if they get worse. Your mouth may get dry. Chewing sugarless gum or sucking hard candy and drinking plenty of water may help. Contact your care team if the problem does not go away or is severe. What side effects may I notice from receiving this medication? Side effects that you should report to your care team as soon as possible: Allergic reactions--skin rash, itching, hives, swelling of the face, lips, tongue, or throat Side effects that usually do not require medical attention (report to your care team if they continue or are bothersome): Drowsiness Dry mouth Fatigue Nausea This list may not describe all possible  side effects. Call your doctor for medical advice about side effects. You may report side effects to FDA at 1-800-FDA-1088. Where should I keep my medication? Keep out of the reach of children and pets. Store between 15 and 30 degrees C (59 and 86 degrees F). Get rid of any unused medication after the expiration date. To get rid of medications that are no longer needed or have expired: Take the medication to a medication take-back program. Check with your pharmacy or law enforcement to find a  location. If you cannot return the medication, check the label or package insert to see if the medication should be thrown out in the garbage or flushed down the toilet. If you are not sure, ask your care team. If it is safe to put it in the trash, pour the medication out of the container. Mix the medication with cat litter, dirt, coffee grounds, or other unwanted substance. Seal the mixture in a bag or container. Put it in the trash. NOTE: This sheet is a summary. It may not cover all possible information. If you have questions about this medicine, talk to your doctor, pharmacist, or health care provider.  2023 Elsevier/Gold Standard (2021-10-13 00:00:00)

## 2022-10-03 NOTE — Telephone Encounter (Signed)
She is doing very well. Can you schedule follow up one year with NP please, can be video if she likes, prefer megan thanks

## 2022-10-03 NOTE — Progress Notes (Signed)
[facility]    Provider:  Dr Jaynee Eagles Requesting Provider: Ladell Pier, MD Primary Care Provider:  Ladell Pier, MD  CC:  headaches  Virtual Visit via Video Note  I connected with Dickson on 10/03/22 at  3:30 PM EST by a video enabled telemedicine application and verified that I am speaking with the correct person using two identifiers.  Location: Patient: home Provider: office   I discussed the limitations of evaluation and management by telemedicine and the availability of in person appointments. The patient expressed understanding and agreed to proceed.  Follow Up Instructions:    I discussed the assessment and treatment plan with the patient. The patient was provided an opportunity to ask questions and all were answered. The patient agreed with the plan and demonstrated an understanding of the instructions.   The patient was advised to call back or seek an in-person evaluation if the symptoms worsen or if the condition fails to improve as anticipated.  I provided 20 minutes of non-face-to-face time during this encounter.   Melvenia Beam, MD   10/03/2022: Patient was started on Ajovy. MRi showed partially empty sella which could have been incidental, no papilledema seen on exam. Here for follow up. Arie Sabina has worked tremendously well, 100%, last few been sick and she has had some migraines but otherwise the Ajovy has been tremendous now she has flu-like symptoms. Likely illness that's causing the headaches. Ajovy has helped tremendously. She has ubrelvy as needed. We will prescribe and follow up in one year. No new brain imaging in epic review.  Patient complains of symptoms per HPI as well as the following symptoms: flu . Pertinent negatives and positives per HPI. All others negative  Reviewed labs:     Latest Ref Rng & Units 08/18/2022    4:05 PM 06/25/2022    3:58 PM 02/11/2022    4:07 PM  CMP  Glucose 70 - 99 mg/dL  116  103    BUN 6 - 20 mg/dL  9  16   Creatinine 0.44 - 1.00 mg/dL  0.67  0.67   Sodium 135 - 145 mmol/L  141  136   Potassium 3.5 - 5.1 mmol/L  2.9  3.4   Chloride 98 - 111 mmol/L  105  100   CO2 22 - 32 mmol/L  27  21   Calcium 8.9 - 10.3 mg/dL  10.1  9.7   Total Protein 6.0 - 8.5 g/dL 7.2   8.3   Total Bilirubin 0.0 - 1.2 mg/dL 0.3   1.3   Alkaline Phos 44 - 121 IU/L 82   85   AST 0 - 40 IU/L 20   42   ALT 0 - 32 IU/L 14   22       Latest Ref Rng & Units 06/25/2022    3:58 PM 02/11/2022    4:07 PM 07/01/2021    9:26 AM  CBC  WBC 4.0 - 10.5 K/uL 9.4  8.5  7.1   Hemoglobin 12.0 - 15.0 g/dL 13.1  14.3  11.3   Hematocrit 36.0 - 46.0 % 38.6  42.8  34.3   Platelets 150 - 400 K/uL 268  272  296       HPI:  Choua C Brabant is a 47 y.o. female here as requested by Ladell Pier, MD for headaches. She has had a headache for a week ago. Migraines started years ago. PMHx bipolar, cancer, depression, gad, graves, kidney stones,  htn, panic attacks, radiation therapy, ocd, pre-diates, ocd, PTSD, RLS, recovering alcoholic, breast and vulvar cancer, Pulsating/pounding/throbbing,photo/phonophobia, nausea, hurts to move, nausea, sister has migraines and daughter. Whole head, no hearing changes, sleep helps, can radiate to the back of the head. Vision is blurry all the time. She doesn't see a difference with glasses. Daily headaches. At least 10 migraine days a month that can last 24 hours and be moderate to severe although has had 4 days of migraines in a row. No aura. No other focal neurologic deficits, associated symptoms, inciting events or modifiable factors.   Reviewed notes, labs and imaging from outside physicians, which showed:  06/28/2022 tsh and ft4 nml  From a thorough review of records meds tried: Tylenol, baclofen,voltaren, benadryl, prozac, gabapentin, ibupren, ketoralc, labetalol, lisinopril, magnesium, methimazole,robaxin, reglan, medrol, zyprexa, prednisone, compazine, propranolol,has  been on topamx also  topiramate contraindicated due to kidney stones, trazodone, effexor,amitriptyline and nortrityline, imitrex and maxalt(palpitations), aimovig contraindicated due to constipation  TECHNIQUE: Multidetector CT imaging of the head was performed using the standard protocol during bolus administration of intravenous contrast. Multiplanar CT image reconstructions and MIPs were obtained to evaluate the vascular anatomy.   RADIATION DOSE REDUCTION: This exam was performed according to the departmental dose-optimization program which includes automated exposure control, adjustment of the mA and/or kV according to patient size and/or use of iterative reconstruction technique.   CONTRAST:  78m OMNIPAQUE IOHEXOL 350 MG/ML SOLN   COMPARISON:  None Available.   FINDINGS: CT HEAD   Brain: Cerebral volume within normal limits for patient age.   No evidence for acute intracranial hemorrhage. No findings to suggest acute large vessel territory infarct. No mass lesion, midline shift, or mass effect. Ventricles are normal in size without evidence for hydrocephalus. No extra-axial fluid collection identified. Partially empty sella noted.   Vascular: No hyperdense vessel identified.   Skull: Scalp soft tissues demonstrate no acute abnormality. Calvarium intact.   Sinuses/Orbits: Globes and orbital soft tissues within normal limits.   Minimal ethmoidal sinus disease. Paranasal sinuses are otherwise clear. No mastoid effusion.   CTA HEAD   Anterior circulation: Both internal carotid arteries widely patent to the termini without stenosis. A1 segments widely patent. Normal anterior communicating artery complex. Both anterior cerebral arteries widely patent to their distal aspects without stenosis. No M1 stenosis or occlusion. Normal MCA bifurcations. Distal MCA branches well perfused and symmetric.   Posterior circulation: Both V4 segments patent to the vertebrobasilar  junction without stenosis. Both PICA origins patent and normal. Basilar widely patent to its distal aspect without stenosis. Superior cerebellar arteries patent bilaterally. Both PCAs primarily supplied via the basilar and are well perfused to there distal aspects.   Venous sinuses: Patent allowing for timing the contrast bolus.   Anatomic variants: None significant.  No intracranial aneurysm.   Review of the MIP images confirms the above findings.   CT VENOGRAM   Normal enhancement seen throughout the superior sagittal sinus to the torcula. Transverse and sigmoid sinuses are patent as are the jugular bulbs and visualized proximal internal jugular veins. Right transverse sinus dominant. Straight sinus, vein of Galen, and internal cerebral veins are patent. No visible cortical vein thrombosis. No dural sinus thrombosis.   IMPRESSION: 1. Normal CTA of the head. No large vessel occlusion, hemodynamically significant stenosis, or other acute vascular abnormality. No aneurysm. 2. Negative CT venogram.  Evidence for dural sinus thrombosis. 3. No other acute intracranial abnormality. 4. Partially empty sella. Finding is nonspecific, but can be seen in  the setting of idiopathic intracranial hypertension.  Review of Systems: Patient complains of symptoms per HPI as well as the following symptoms migraines. Pertinent negatives and positives per HPI. All others negative.   Social History   Socioeconomic History   Marital status: Single    Spouse name: Not on file   Number of children: Not on file   Years of education: Not on file   Highest education level: Not on file  Occupational History   Not on file  Tobacco Use   Smoking status: Every Day    Packs/day: 0.50    Years: 22.00    Total pack years: 11.00    Types: Cigarettes   Smokeless tobacco: Never   Tobacco comments:    Recently started a smoking cessation class.   Vaping Use   Vaping Use: Never used  Substance and  Sexual Activity   Alcohol use: Not Currently   Drug use: Yes    Types: Marijuana   Sexual activity: Yes    Partners: Male    Birth control/protection: Surgical  Other Topics Concern   Not on file  Social History Narrative   Former [facility] patient.      Was on disability at one point.   Return to the workforce.  40 hours a week at [employer], 10 hours a week on the weekends at [employer] in [place] at night 10 to 12 hours a week.      Has grown children, she lives alone with a pet, continues to smoke no alcohol or drug use at this time      History of EtOH abuse and THC use.         Social Determinants of Health   Financial Resource Strain: Low Risk  (07/01/2021)   Overall Financial Resource Strain (CARDIA)    Difficulty of Paying Living Expenses: Not hard at all  Food Insecurity: No Food Insecurity (07/01/2021)   Hunger Vital Sign    Worried About Running Out of Food in the Last Year: Never true    Ran Out of Food in the Last Year: Never true  Transportation Needs: No Transportation Needs (07/01/2021)   PRAPARE - Hydrologist (Medical): No    Lack of Transportation (Non-Medical): No  Physical Activity: Inactive (07/01/2021)   Exercise Vital Sign    Days of Exercise per Week: 0 days    Minutes of Exercise per Session: 0 min  Stress: Stress Concern Present (07/01/2021)   [place]    Feeling of Stress : Rather much  Social Connections: Socially Isolated (07/01/2021)   Social Connection and Isolation Panel [NHANES]    Frequency of Communication with Friends and Family: More than three times a week    Frequency of Social Gatherings with Friends and Family: More than three times a week    Attends Religious Services: Never    Marine scientist or Organizations: No    Attends Archivist Meetings: Never    Marital Status: Never married   Intimate Partner Violence: Not At Risk (07/01/2021)   Humiliation, Afraid, Rape, and Kick questionnaire    Fear of Current or Ex-Partner: No    Emotionally Abused: No    Physically Abused: No    Sexually Abused: No    Family History  Problem Relation Age of Onset   Heart disease Father    Lung cancer Father  d. 75   Alcohol abuse Father    Heart disease Mother    Depression Mother    Anxiety disorder Mother    Drug abuse Brother    Alcohol abuse Brother    Drug abuse Brother    ADD / ADHD Brother    Colon polyps Brother    Cancer Paternal Grandfather        "stomach"   Diabetes Maternal Grandfather    Diabetes Paternal Grandmother    Kidney disease Maternal Uncle    Cirrhosis Cousin        alcoholic   Anxiety disorder Maternal Aunt    Depression Maternal Aunt    Cancer Cousin        maternal; ovarian cancer or other "female" cancer?   Lung cancer Paternal Uncle 53   Throat cancer Cousin        paternal; dx 42s   Lung cancer Cousin        paternal; dx 32s    Past Medical History:  Diagnosis Date   Arthritis    Bipolar 1 disorder ([place])    Cancer ([place])    vulva, and breast   Complication of anesthesia    wakes up during procedures   Depression    GAD (generalized anxiety disorder)    Genital HSV    currently per pt  no break out 03-22-2016    GERD (gastroesophageal reflux disease)    Graves disease    Hiatal hernia    History of cervical dysplasia    2012 laser ablation   History of esophageal dilatation    for dysphasia -- x2 dilated   History of gastric ulcer    History of Helicobacter pylori infection    remote hx   History of hidradenitis suppurativa    "gets all over body intermittantly"     History of hypertension    no issue since stopped drinking alcohol 2014   History of kidney stones    History of panic attacks    History of radiation therapy 03/16/2020-05/08/2020   vulva  Dr Gery Pray   History of radiation therapy  09/09/2020-10/23/2020   left chest wall/left SCV   Dr Gery Pray   Hypertension    Iron deficiency anemia    Left ureteral stone    OCD (obsessive compulsive disorder)    PONV (postoperative nausea and vomiting)    Pre-diabetes    PTSD (post-traumatic stress disorder)    Recovering alcoholic in remission ([place])    since 2014   RLS (restless legs syndrome)    Smokers' cough ([place])    Thyroid disease    Urgency of urination    Yeast infection involving the vagina and surrounding area    secondary to taking antibiotic    Patient Active Problem List   Diagnosis Date Noted   Migraine with aura and without status migrainosus, not intractable 10/03/2022   Pain in right knee 09/07/2022   Bilateral primary osteoarthritis of knee 09/07/2022   Chronic migraine without aura without status migrainosus, not intractable 07/07/2022   Sebaceous cyst 12/06/2021   Pain in left knee 11/03/2021   Hyperthyroidism 08/13/2021   Bipolar 1 disorder ([place]) 05/07/2021   Obesity (BMI 30.0-34.9) 05/07/2021   Hyperlipidemia 08/12/2020   Prediabetes 08/12/2020   Hypomagnesemia 08/12/2020   Mutation in BRIP1 gene 06/24/2020   Hypokalemia 06/17/2020   Dehydration    Colitis 06/16/2020   Port-A-Cath in place 03/11/2020   Malignant neoplasm of upper-inner quadrant of left breast in  female, estrogen receptor positive ([place]) 02/25/2020   Vulvar cancer ([place]) 01/29/2020   Vulvar ulceration 01/23/2020   Essential hypertension 10/31/2019   Nausea and vomiting 12/27/2018   Seasonal allergic rhinitis 12/27/2018   History of ELISA positive for HSV 10/01/2018   Restless leg syndrome 04/01/2016   Nephrolithiasis 03/08/2016   Obstructive pyelonephritis 03/08/2016   Normocytic anemia 03/08/2016   Hidradenitis suppurativa of left axilla 11/03/2015   Loss of weight 09/08/2015   Neck pain on left side 10/22/2014   Genital warts 05/20/2014   GERD (gastroesophageal reflux disease) 12/26/2012   Functional dyspepsia  12/26/2012   Tobacco abuse 08/22/2012   Herpes simplex virus (HSV) infection 02/03/2007    Past Surgical History:  Procedure Laterality Date   CESAREAN SECTION  1995   w/  Bilateral Tubal Ligation   COLONOSCOPY  last one 08-09-2013   CYSTOSCOPY W/ URETERAL STENT PLACEMENT Left 03/29/2016   Procedure: CYSTOSCOPY WITH STENT REPLACEMENT;  Surgeon: Nickie Retort, MD;  [facility];  Service: Urology;  Laterality: Left;   CYSTOSCOPY WITH RETROGRADE PYELOGRAM, URETEROSCOPY AND STENT PLACEMENT Left 03/08/2016   Procedure: CYSTOSCOPY WITH  LEFT RETROGRADE PYELOGRAM, AND STENT PLACEMENT;  Surgeon: Nickie Retort, MD;  Location: [facility];  Service: Urology;  Laterality: Left;   CYSTOSCOPY/RETROGRADE/URETEROSCOPY/STONE EXTRACTION WITH BASKET Left 03/29/2016   Procedure: CYSTOSCOPY/RETROGRADE/URETEROSCOPY/STONE EXTRACTION WITH BASKET;  Surgeon: Nickie Retort, MD;  Location: [facility];  Service: Urology;  Laterality: Left;   ENDOMETRIAL ABLATION W/ NOVASURE  04-01-2010   ESOPHAGOGASTRODUODENOSCOPY  last one 08-09-2013   KNEE ARTHROSCOPY Left as teen   LASER ABLATION OF THE CERVIX  2012 approx   MASTECTOMY WITH AXILLARY LYMPH NODE DISSECTION Left 07/27/2020   Procedure: LEFT MASTECTOMY WITH LEFT RADIOACTIVE SEED GUIDED TARGETED AXILLARY LYMPH NODE DISSECTION;  Surgeon: Alphonsa Overall, MD;  Location: [place];  Service: General;  Laterality: Left;   PORT-A-CATH REMOVAL Right 05/18/2021   Procedure: REMOVAL PORT-A-CATH;  Surgeon: Donnie Mesa, MD;  Location: [facility];  Service: General;  Laterality: Right;   PORTACATH PLACEMENT Right 03/06/2020   Procedure: INSERTION PORT-A-CATH WITH ULTRASOUND GUIDANCE;  Surgeon: Alphonsa Overall, MD;  Location: [place];  Service: General;  Laterality: Right;   ROBOTIC ASSISTED TOTAL HYSTERECTOMY WITH BILATERAL SALPINGO OOPHERECTOMY N/A 05/18/2021   Procedure: XI ROBOTIC ASSISTED TOTAL HYSTERECTOMY WITH BILATERAL  SALPINGO OOPHORECTOMY;  Surgeon: Everitt Amber, MD;  Location: [facility];  Service: Gynecology;  Laterality: N/A;   TRANSTHORACIC ECHOCARDIOGRAM  05-19-2006   lvsf normal, ef 55-65%, there was mild flattening of the interventricular septum during diastoli/  RV size at upper limits normal   TUBAL LIGATION     VULVA /PERINEUM BIOPSY N/A 05/18/2021   Procedure: VULVAR BIOPSY;  Surgeon: Everitt Amber, MD;  Location: [facility];  Service: Gynecology;  Laterality: N/A;   WISDOM TOOTH EXTRACTION  age 61 's    Current Outpatient Medications  Medication Sig Dispense Refill   acetaminophen (TYLENOL) 500 MG tablet Take 1 tablet (500 mg total) by mouth every 6 (six) hours as needed. (Patient taking differently: Take 500 mg by mouth as needed.) 30 tablet 0   diclofenac Sodium (VOLTAREN) 1 % GEL Apply 4 grams topically 4  times daily as needed. 500 g 6   diclofenac Sodium (VOLTAREN) 1 % GEL Apply topically.     FLUoxetine (PROZAC) 10 MG capsule Take 3 capsules (30 mg total) by mouth daily. 90 capsule 2   Fremanezumab-vfrm (AJOVY) 225 MG/1.5ML SOAJ Inject 225 mg  into the skin every 30 (thirty) days. 4.5 mL 0   Fremanezumab-vfrm (AJOVY) 225 MG/1.5ML SOAJ Inject 225 mg into the skin every 30 (thirty) days. 1.5 mL 11   gabapentin (NEURONTIN) 300 MG capsule Take 1 capsule (300 mg total) by mouth daily. Pt needs office visit. 30 capsule 5   letrozole (FEMARA) 2.5 MG tablet Take 1 tablet (2.5 mg total) by mouth daily. 90 tablet 3   letrozole (FEMARA) 2.5 MG tablet Take 1 tablet (2.5 mg total) by mouth daily. 90 tablet 3   lisinopril-hydrochlorothiazide (ZESTORETIC) 10-12.5 MG tablet Take 1 tablet by mouth daily. 90 tablet 3   loratadine (CLARITIN) 10 MG tablet Take 10 mg by mouth daily.     LORazepam (ATIVAN) 1 MG tablet Take 1 tablet (1 mg total) by mouth 3 (three) times daily as needed for anxiety 90 tablet 1   methimazole (TAPAZOLE) 10 MG tablet Take 1 tablet (10 mg total) by mouth 2 (two) times daily. 60 tablet 4    methimazole (TAPAZOLE) 5 MG tablet Take one tablet (5 mg dose) by mouth daily. 90 tablet 3   Multiple Vitamin (MULTIVITAMIN WITH MINERALS) TABS tablet Take 1 tablet by mouth daily.     nicotine (NICODERM CQ) 14 mg/24hr patch Place 1 patch (14 mg total) onto the skin daily.  Remove old patch, fold together & discard away from children & pets. 28 patch 0   nicotine polacrilex (NICORETTE) 2 MG gum Chew gum until it tingles.  Then park between cheek and gum until tingle is gone.  Chew to reactivate tingle up to approximately 30 minutes.  Do not exceed 9 pieces of gum per day as needed for cravings during smoking cessation. 110 tablet 0   OLANZapine (ZYPREXA) 15 MG tablet Take 1 tablet (15 mg total) by mouth at bedtime. 30 tablet 2   omeprazole (PRILOSEC) 40 MG capsule Take 1 capsule (40 mg total) by mouth in the morning and at bedtime. 60 capsule 5   ondansetron (ZOFRAN-ODT) 8 MG disintegrating tablet Dissolve 1 tablet (8 mg total) by mouth every 8 (eight) hours as needed for nausea or vomiting. 20 tablet 0   oseltamivir (TAMIFLU) 75 MG capsule Take 1 capsule (75 mg total) by mouth every 12 (twelve) hours. 10 capsule 0   promethazine (PHENERGAN) 25 MG tablet TAKE 1 TABLET BY MOUTH EVERY 6 HOURS AS NEEDED FOR NAUSEA 30 tablet 6   propranolol (INDERAL) 10 MG tablet Take 1 tablet by mouth 2 (two) times daily. (Patient taking differently: Take 10 mg by mouth as needed.) 180 tablet 1   pseudoephedrine (SUDAFED) 60 MG tablet Take 1 tablet (60 mg total) by mouth every 8 (eight) hours as needed for congestion. 30 tablet 0   rosuvastatin (CRESTOR) 10 MG tablet Take 1 tablet (10 mg total) by mouth daily. 90 tablet 3   senna-docusate (SENOKOT-S) 8.6-50 MG tablet Take 2 tablets by mouth at bedtime. For AFTER surgery only, do not take if having diarrhea 30 tablet 0   Ubrogepant (UBRELVY) 100 MG TABS Take 1 tablet (100 mg total) by mouth every 2 (two) hours as needed. Maximum '200mg'$  a day. 16 tablet 11   No current  facility-administered medications for this visit.    Allergies as of 10/03/2022 - Review Complete 09/30/2022  Allergen Reaction Noted   Dilaudid [hydromorphone hcl] Other (See Comments) 06/18/2020   Aspirin Hives    Depakote [divalproex sodium] Nausea And Vomiting 11/01/2016   Minocycline Hives 09/23/2011    Vitals: There were  no vitals taken for this visit. Last Weight:  Wt Readings from Last 1 Encounters:  09/29/22 200 lb (90.7 kg)   Last Height:   Ht Readings from Last 1 Encounters:  09/29/22 '5\' 5"'$  (1.651 m)    Physical exam: Exam: Gen: NAD, conversant      CV: No signs of palpitations or chest pain or SOB. VS: Breathing at a normal rate. Not febrile appearing Eyes: Conjunctivae clear without exudates or hemorrhage  Neuro: Detailed Neurologic Exam  Speech:    Speech is normal; fluent and spontaneous with normal comprehension.  Cognition:    The patient is oriented to person, place, and time;     recent and remote memory intact;     language fluent;     normal attention, concentration,     fund of knowledge Cranial Nerves:    The pupils are equal, round, and reactive to light. Visual fields are full to finger confrontation. Extraocular movements are intact.  The face is symmetric with normal sensation. The palate elevates in the midline. Hearing intact. Voice is normal. Shoulder shrug is normal. The tongue has normal motion without fasciculations.    Motor Observation:   no involuntary movements noted. Tone:    Appears normal  Posture:    Posture is normal. normal erect    Strength:    Strength is anti-gravity and symmetric in the upper          Assessment/Plan:  Patient with chronic migraines. Empty sella may be incidental but if no improvement can consider further workup for IDIOPATHIC INTRACRANIAL HYPERTENSION, exam including findoscopic exam normal.   Doing excellent on Ajovy 4 mild migraines a month and < 6 total headache days a month, Can order  ubrelvy for acute management. Baseline prior to Ajovy was Daily headaches and >> 10 migraine days a month that can last 24 hours and be moderate to severe  Patient was started on Ajovy. MRi showed partially empty sella which could have been incidental, no papilledema seen on exam. Here for follow up. Arie Sabina has worked tremendously well, 100%, last few been sick and she has had some migraines but otherwise the Ajovy has been tremendous now she has flu-like symptoms. Likely illness that's causing the headaches. Ajovy has helped tremendously. She has ubrelvy as needed.   Continue Ajovy and Ubrelvy  No orders of the defined types were placed in this encounter.  Meds ordered this encounter  Medications   Fremanezumab-vfrm (AJOVY) 225 MG/1.5ML SOAJ    Sig: Inject 225 mg into the skin every 30 (thirty) days.    Dispense:  1.5 mL    Refill:  11    BIN [id number] PCN CN group [id number] ID [id number] expires 09/04/2022   Ubrogepant (UBRELVY) 100 MG TABS    Sig: Take 1 tablet (100 mg total) by mouth every 2 (two) hours as needed. Maximum '200mg'$  a day.    Dispense:  16 tablet    Refill:  11    Cc: Ladell Pier, MD,  Ladell Pier, MD  Sarina Ill, MD  [facility] [address] [place],  [address]  Phone [phone] Fax [phone]

## 2022-10-03 NOTE — Telephone Encounter (Signed)
Sent mychart msg informing pt of follow up appointment made with [facility]

## 2022-10-04 ENCOUNTER — Other Ambulatory Visit (HOSPITAL_COMMUNITY): Payer: Self-pay

## 2022-10-04 ENCOUNTER — Encounter: Payer: Self-pay | Admitting: Hematology and Oncology

## 2022-10-11 ENCOUNTER — Other Ambulatory Visit: Payer: Self-pay

## 2022-10-11 ENCOUNTER — Other Ambulatory Visit (HOSPITAL_COMMUNITY): Payer: Self-pay

## 2022-10-12 ENCOUNTER — Ambulatory Visit: Payer: Self-pay | Admitting: *Deleted

## 2022-10-12 ENCOUNTER — Emergency Department (HOSPITAL_BASED_OUTPATIENT_CLINIC_OR_DEPARTMENT_OTHER): Payer: No Typology Code available for payment source

## 2022-10-12 ENCOUNTER — Emergency Department (HOSPITAL_COMMUNITY)
Admission: EM | Admit: 2022-10-12 | Discharge: 2022-10-13 | Discharge status: Against Medical Advice | Payer: No Typology Code available for payment source | Attending: Emergency Medicine | Admitting: Emergency Medicine

## 2022-10-12 DIAGNOSIS — Z7901 Long term (current) use of anticoagulants: Secondary | ICD-10-CM | POA: Diagnosis not present

## 2022-10-12 DIAGNOSIS — Z8544 Personal history of malignant neoplasm of other female genital organs: Secondary | ICD-10-CM | POA: Diagnosis not present

## 2022-10-12 DIAGNOSIS — J029 Acute pharyngitis, unspecified: Secondary | ICD-10-CM | POA: Insufficient documentation

## 2022-10-12 DIAGNOSIS — Z5321 Procedure and treatment not carried out due to patient leaving prior to being seen by health care provider: Secondary | ICD-10-CM | POA: Diagnosis not present

## 2022-10-12 DIAGNOSIS — M542 Cervicalgia: Secondary | ICD-10-CM | POA: Insufficient documentation

## 2022-10-12 DIAGNOSIS — Z853 Personal history of malignant neoplasm of breast: Secondary | ICD-10-CM | POA: Diagnosis not present

## 2022-10-12 DIAGNOSIS — R52 Pain, unspecified: Secondary | ICD-10-CM | POA: Diagnosis not present

## 2022-10-12 LAB — CBC WITH DIFFERENTIAL/PLATELET
Abs Immature Granulocytes: 0.03 10*3/uL (ref 0.00–0.07)
Basophils Absolute: 0 10*3/uL (ref 0.0–0.1)
Basophils Relative: 0 %
Eosinophils Absolute: 0.1 10*3/uL (ref 0.0–0.5)
Eosinophils Relative: 1 %
HCT: 36.4 % (ref 36.0–46.0)
Hemoglobin: 12.1 g/dL (ref 12.0–15.0)
Immature Granulocytes: 0 %
Lymphocytes Relative: 39 %
Lymphs Abs: 3.7 10*3/uL (ref 0.7–4.0)
MCH: 29.4 pg (ref 26.0–34.0)
MCHC: 33.2 g/dL (ref 30.0–36.0)
MCV: 88.3 fL (ref 80.0–100.0)
Monocytes Absolute: 0.5 10*3/uL (ref 0.1–1.0)
Monocytes Relative: 5 %
Neutro Abs: 5.2 10*3/uL (ref 1.7–7.7)
Neutrophils Relative %: 55 %
Platelets: 303 10*3/uL (ref 150–400)
RBC: 4.12 MIL/uL (ref 3.87–5.11)
RDW: 14.6 % (ref 11.5–15.5)
WBC: 9.6 10*3/uL (ref 4.0–10.5)
nRBC: 0 % (ref 0.0–0.2)

## 2022-10-12 LAB — COMPREHENSIVE METABOLIC PANEL
ALT: 17 U/L (ref 0–44)
AST: 21 U/L (ref 15–41)
Albumin: 4.1 g/dL (ref 3.5–5.0)
Alkaline Phosphatase: 57 U/L (ref 38–126)
Anion gap: 11 (ref 5–15)
BUN: 6 mg/dL (ref 6–20)
CO2: 27 mmol/L (ref 22–32)
Calcium: 9.2 mg/dL (ref 8.9–10.3)
Chloride: 104 mmol/L (ref 98–111)
Creatinine, Ser: 0.63 mg/dL (ref 0.44–1.00)
GFR, Estimated: >60 mL/min (ref >60)
Glucose, Bld: 95 mg/dL (ref 70–99)
Potassium: 2.7 mmol/L — CL (ref 3.5–5.1)
Sodium: 142 mmol/L (ref 135–145)
Total Bilirubin: 0.6 mg/dL (ref 0.3–1.2)
Total Protein: 7 g/dL (ref 6.5–8.1)

## 2022-10-12 LAB — MONONUCLEOSIS SCREEN: Mono Screen: NEGATIVE

## 2022-10-12 NOTE — ED Provider Triage Note (Addendum)
Emergency Medicine Provider Triage Evaluation Note  Sandra Miller , a 47 y.o. female  was evaluated in triage.  Pt complains of left-sided sore throat for the last 2 days it is worse with swallowing.  She states that she has a history of a blood clot in the IJ that happened sometime between 1 to 2 years ago while she was undergoing chemotherapy treatments for her breast and vulvar cancer.  States that she was on Xarelto for over a year when she was taking off by her hematologist sometime late last year.  Apart from this, patient has had no other blood clots.  Currently, no difficulty swallowing, fever, chills, cough, congestion, chest pain, shortness of breath, focal weakness, headache, nausea, vomiting, vision changes, or other concerns.  Of note, patient believes that she had influenza last week for which she was treated with Tamiflu (evaluated at urgent care though they were out of influenza swabs that she was not tested).  She states that the symptoms resolved and at that time she did not have the sore throat.  Review of Systems  Positive: See HPI Negative: See HPI  Physical Exam  BP (!) 164/102   Pulse 97   Temp 98.6 F (37 C) (Oral)   Resp 16   SpO2 100%  Gen:   Awake, no distress   Resp:  Normal effort lungs clear to auscultation MSK:   Moves extremities without difficulty, no upper or lower extremity edema Other:  Regular rate and rhythm, moderate posterior pharyngeal erythema without exudates or swelling, uvula midline, alert and oriented, 5/5 strength to bilateral upper and lower extremities, cranial nerves intact, no significant palpable lymphadenopathy to the neck, mild tenderness diffusely over the left neck, no midline cervical spine tenderness  Medical Decision Making  Medically screening exam initiated at 5:53 PM.  Appropriate orders placed.  Sandra Miller was informed that the remainder of the evaluation will be completed by another provider, this initial triage assessment  does not replace that evaluation, and the importance of remaining in the ED until their evaluation is complete.     Suzzette Righter, PA-C 10/12/22 1755    Suzzette Righter, PA-C 10/12/22 1919

## 2022-10-12 NOTE — ED Triage Notes (Signed)
Pt states that she was referred to the ED for L sided throat/neck pain she's been having. Pt states she has hx of jugular blood clot, and pain feels similar. Denies trouble breathing/swallowing. Pt not anticoagulated.

## 2022-10-12 NOTE — Progress Notes (Signed)
LUE venous duplex has been completed.  Preliminary results given to Dallie Piles, PA-C.   Results can be found under chart review under CV PROC. 10/12/2022 8:00 PM Daiton Cowles RVT, RDMS

## 2022-10-12 NOTE — ED Notes (Addendum)
Staff reports to this RN that pt made decision to leave ER. Called patient and left voicemail asking her to call ER back as she had abnormal results that needed review in the ER. Encouraged her to return to ER via this message

## 2022-10-12 NOTE — Telephone Encounter (Signed)
  Chief Complaint: c/o sore throat hx blood clot in jugular  Symptoms: sore throat "feels like same as when had blood clot" in neck  Frequency: 2 days  Pertinent Negatives: Patient denies difficulty breathing no fever, no difficulty swallowing no redness or pus on tonsils Disposition: '[x]'$ ED /'[]'$ Urgent Care (no appt availability in office) / '[]'$ Appointment(In office/virtual)/ '[]'$  [facility]/ '[]'$ Home Care/ '[]'$ Refused Recommended Disposition /'[]'$ [facility]/ '[]'$  Follow-up with PCP Additional Notes:   Recommended ED due to hx of blood clot in neck. No available appt today . Patient declined ED would like PCP to order ultrasound. Please advise.     Reason for Disposition  Patient sounds very sick or weak to the triager  Answer Assessment - Initial Assessment Questions 1. ONSET: "When did the throat start hurting?" (Hours or days ago)      2 days ago  2. SEVERITY: "How bad is the sore throat?" (Scale 1-10; mild, moderate or severe)   - MILD (1-3):  Doesn't interfere with eating or normal activities.   - MODERATE (4-7): Interferes with eating some solids and normal activities.   - SEVERE (8-10):  Excruciating pain, interferes with most normal activities.   - SEVERE WITH DYSPHAGIA (10): Can't swallow liquids, drooling.     No issues swallowing c/o soreness in throat  3. STREP EXPOSURE: "Has there been any exposure to strep within the past week?" If Yes, ask: "What type of contact occurred?"      na 4.  VIRAL SYMPTOMS: "Are there any symptoms of a cold, such as a runny nose, cough, hoarse voice or red eyes?"      No  5. FEVER: "Do you have a fever?" If Yes, ask: "What is your temperature, how was it measured, and when did it start?"     no 6. PUS ON THE TONSILS: "Is there pus on the tonsils in the back of your throat?"     na 7. OTHER SYMPTOMS: "Do you have any other symptoms?" (e.g., difficulty breathing, headache, rash)     Migraine x 2 days hx of migraines.  8. PREGNANCY:  "Is there any chance you are pregnant?" "When was your last menstrual period?"     na  Protocols used: Sore Throat-A-AH

## 2022-10-13 ENCOUNTER — Other Ambulatory Visit: Payer: Self-pay

## 2022-10-13 ENCOUNTER — Other Ambulatory Visit: Payer: Self-pay | Admitting: Internal Medicine

## 2022-10-13 ENCOUNTER — Telehealth: Payer: PRIVATE HEALTH INSURANCE | Admitting: Neurology

## 2022-10-13 ENCOUNTER — Other Ambulatory Visit (HOSPITAL_COMMUNITY): Payer: Self-pay

## 2022-10-13 ENCOUNTER — Ambulatory Visit: Payer: Self-pay

## 2022-10-13 DIAGNOSIS — E876 Hypokalemia: Secondary | ICD-10-CM

## 2022-10-13 MED ORDER — POTASSIUM CHLORIDE CRYS ER 20 MEQ PO TBCR: EXTENDED_RELEASE_TABLET | ORAL | 0 refills | Status: DC

## 2022-10-13 MED ORDER — POTASSIUM CHLORIDE CRYS ER 20 MEQ PO TBCR
EXTENDED_RELEASE_TABLET | ORAL | 0 refills | Status: DC
  Filled 2022-10-13: qty 9, 4d supply, fill #0

## 2022-10-13 NOTE — Telephone Encounter (Signed)
Spoke with patient . Patient requested that medication be sent to [facility] near [employer] [place]. PCP made aware and has agreed to send. Patient informed that PCP has instructed her to come to [facility] on Monday 10/17/2022 for have potassium redrawn. No appointment is necessary . Patient voiced understanding of all discussed .

## 2022-10-13 NOTE — Telephone Encounter (Signed)
PT called regarding potassium supplement. Called FC at 4:57 no answer on telephone. Sent teams message at 4:59 to [employer].  Pt states that ED called and asked her to return to ED concerning potassium labs. Pt did not return to ED an states she will not do that now. PT will go to pharmacy and pick up potassium OTC supplement. She will consult PharmD for help with that.   PT would like a call back regarding potassium supplementation.   Please advise.

## 2022-10-13 NOTE — Telephone Encounter (Addendum)
Message from Erick Blinks sent at 10/13/2022 10:32 AM EST  Summary: Low potassium labs from ED   Pt was seen at the ED and wants to know if her PCP will send in something to her pharmacy because of her low potassium lab work, she left the ED before discharge because she was tired of waiting.  [place] pharmacy        Called pt, LM on Vm to call back.  Pt eloped from ED and was calling to get potassium supplement.   Chief Complaint: low K  Disposition: '[]'$ ED /'[]'$ Urgent Care (no appt availability in office) / '[]'$ Appointment(In office/virtual)/ '[]'$  [facility]/ '[]'$ Home Care/ '[]'$ Refused Recommended Disposition /'[]'$  Mobile Bus/ '[x]'$  Follow-up with PCP Additional Notes: routing to office.   Reason for Disposition  NON-URGENT call redirected to PCP's office because it is open  Protocols used: No Contact or Duplicate Contact Call-A-AH

## 2022-10-17 ENCOUNTER — Ambulatory Visit: Payer: No Typology Code available for payment source | Attending: Family Medicine

## 2022-10-17 ENCOUNTER — Encounter: Payer: Self-pay | Admitting: Hematology and Oncology

## 2022-10-17 DIAGNOSIS — E876 Hypokalemia: Secondary | ICD-10-CM

## 2022-10-18 ENCOUNTER — Telehealth: Payer: Self-pay | Admitting: Internal Medicine

## 2022-10-18 DIAGNOSIS — E870 Hyperosmolality and hypernatremia: Secondary | ICD-10-CM

## 2022-10-18 LAB — BASIC METABOLIC PANEL
BUN/Creatinine Ratio: 7 — ABNORMAL LOW (ref 9–23)
BUN: 5 mg/dL — ABNORMAL LOW (ref 6–24)
CO2: 23 mmol/L (ref 20–29)
Calcium: 10 mg/dL (ref 8.7–10.2)
Chloride: 97 mmol/L (ref 96–106)
Creatinine, Ser: 0.68 mg/dL (ref 0.57–1.00)
Glucose: 83 mg/dL (ref 70–99)
Potassium: 4.2 mmol/L (ref 3.5–5.2)
Sodium: 149 mmol/L — ABNORMAL HIGH (ref 134–144)
eGFR: 107 mL/min/{1.73_m2} (ref >59)

## 2022-10-18 NOTE — Telephone Encounter (Signed)
Copied from [place]. Topic: General - Inquiry >> Oct 18, 2022 10:16 AM Sandra Miller wrote: Reason for CRM: Pt is requesting a call back to discuss recent labs.Stated if can't reach her please send information via MyChart.   Please advise.

## 2022-10-19 ENCOUNTER — Encounter: Payer: Self-pay | Admitting: Neurology

## 2022-10-19 NOTE — Telephone Encounter (Signed)
Patient sent MyChart message this morning.  Her potassium level has normalized.  Sodium level is elevated.  Advised that she drink several glasses of water daily.  Return to the lab next Monday for repeat chemistry.

## 2022-10-19 NOTE — Addendum Note (Signed)
Addended by: Karle Plumber B on: 10/19/2022 07:22 AM   Modules accepted: Orders

## 2022-10-20 ENCOUNTER — Telehealth: Payer: PRIVATE HEALTH INSURANCE | Admitting: Neurology

## 2022-10-20 ENCOUNTER — Other Ambulatory Visit: Payer: Self-pay

## 2022-10-20 ENCOUNTER — Other Ambulatory Visit (HOSPITAL_COMMUNITY): Payer: Self-pay

## 2022-10-20 ENCOUNTER — Other Ambulatory Visit: Payer: Self-pay | Admitting: Hematology and Oncology

## 2022-10-20 MED ORDER — LETROZOLE 2.5 MG PO TABS
2.5000 mg | ORAL_TABLET | Freq: Every day | ORAL | 3 refills | Status: DC
  Filled 2022-10-20: qty 30, 30d supply, fill #0

## 2022-10-20 NOTE — Progress Notes (Deleted)
   Virtual Visit via Video Note  I connected with Sandra Miller on 10/20/22 at  3:45 PM EST by a video enabled telemedicine application and verified that I am speaking with the correct person using two identifiers.  Location: Patient: *** Provider: ***   I discussed the limitations of evaluation and management by telemedicine and the availability of in person appointments. The patient expressed understanding and agreed to proceed.  History of Present Illness: Today Oct 20, 2022 SS:   10/03/2022: Patient was started on Ajovy. MRi showed partially empty sella which could have been incidental, no papilledema seen on exam. Here for follow up. Arie Sabina has worked tremendously well, 100%, last few been sick and she has had some migraines but otherwise the Ajovy has been tremendous now she has flu-like symptoms. Likely illness that's causing the headaches. Ajovy has helped tremendously. She has ubrelvy as needed. We will prescribe and follow up in one year. No new brain imaging in epic review.    Observations/Objective:   Assessment and Plan: 1.  Chronic migraine headaches  Follow Up Instructions:    I discussed the assessment and treatment plan with the patient. The patient was provided an opportunity to ask questions and all were answered. The patient agreed with the plan and demonstrated an understanding of the instructions.   The patient was advised to call back or seek an in-person evaluation if the symptoms worsen or if the condition fails to improve as anticipated.   Evangeline Dakin, DNP  [facility] [address] [place], [place] [address] [phone]

## 2022-10-21 ENCOUNTER — Other Ambulatory Visit: Payer: Self-pay

## 2022-10-21 ENCOUNTER — Other Ambulatory Visit (HOSPITAL_COMMUNITY): Payer: Self-pay

## 2022-10-24 ENCOUNTER — Ambulatory Visit: Payer: No Typology Code available for payment source | Attending: Family Medicine

## 2022-10-24 DIAGNOSIS — E870 Hyperosmolality and hypernatremia: Secondary | ICD-10-CM

## 2022-10-25 ENCOUNTER — Telehealth: Payer: Self-pay | Admitting: Internal Medicine

## 2022-10-25 ENCOUNTER — Ambulatory Visit: Payer: Self-pay | Admitting: *Deleted

## 2022-10-25 DIAGNOSIS — E876 Hypokalemia: Secondary | ICD-10-CM

## 2022-10-25 LAB — BASIC METABOLIC PANEL
BUN/Creatinine Ratio: 12 (ref 9–23)
BUN: 8 mg/dL (ref 6–24)
CO2: 26 mmol/L (ref 20–29)
Calcium: 9.5 mg/dL (ref 8.7–10.2)
Chloride: 99 mmol/L (ref 96–106)
Creatinine, Ser: 0.68 mg/dL (ref 0.57–1.00)
Glucose: 104 mg/dL — ABNORMAL HIGH (ref 70–99)
Potassium: 2.9 mmol/L — ABNORMAL LOW (ref 3.5–5.2)
Sodium: 141 mmol/L (ref 134–144)
eGFR: 107 mL/min/{1.73_m2} (ref >59)

## 2022-10-25 MED ORDER — POTASSIUM CHLORIDE CRYS ER 20 MEQ PO TBCR: 40.0000 meq | EXTENDED_RELEASE_TABLET | Freq: Every day | ORAL | 3 refills | Status: DC

## 2022-10-25 NOTE — Telephone Encounter (Signed)
Reason for Disposition  [1] Follow-up call to recent contact AND [2] information only call, no triage required  Answer Assessment - Initial Assessment Questions 1. REASON FOR CALL or QUESTION: "What is your reason for calling today?" or "How can I best help you?" or "What question do you have that I can help answer?"     Pt returned call and was given her lab result message from Dr. Karle Plumber dated 10/25/2022 at 8:40 AM.  She verbalized understanding to come in for lab draw in 1-2 weeks.  Protocols used: Information Only Call - No Triage-A-AH

## 2022-10-25 NOTE — Telephone Encounter (Addendum)
Patient returned a call to go over her lab work results. She gets off of work at 3 so please call her after that. Please call patient to assist further

## 2022-10-25 NOTE — Telephone Encounter (Signed)
FYI

## 2022-10-25 NOTE — Telephone Encounter (Signed)
Call placed to patient unable to reach . Call  to inform patient per PCP " potassium level has dropped to the low again.  This is most likely due to the hydrochlorothiazide component in the blood pressure medicine lisinopril/hydrochlorothiazide.  The hydrochlorothiazide can cause potassium loss in the urine.  We will need to keep her on potassium supplement as long as she is on this medication.  I have sent a prescription to her pharmacy for potassium supplement 20 mEq and she will take 2 tablets daily every day from now on.  Return to the lab in 1 to 2 weeks for recheck.  At that time we will also check a hormone level called aldosterone/renin is over secretion of these hormones can also affect potassium level. " Message left on VM to call office  for lab results

## 2022-10-26 ENCOUNTER — Encounter: Payer: Self-pay | Admitting: Gynecologic Oncology

## 2022-10-27 NOTE — Telephone Encounter (Signed)
Return call from patient . Patient voiced she was aware of the message from her PCP. She has pick up med's and she was able to repeat the place her PCP  had for her next visit. Patient denies  having any questions.

## 2022-10-27 NOTE — Telephone Encounter (Signed)
10/26/2022 (late entry)Call placed to patient unable to reach message left on VM.

## 2022-10-28 ENCOUNTER — Inpatient Hospital Stay: Payer: No Typology Code available for payment source | Admitting: Gynecologic Oncology

## 2022-10-28 ENCOUNTER — Ambulatory Visit: Payer: No Typology Code available for payment source | Admitting: Gynecologic Oncology

## 2022-10-28 ENCOUNTER — Telehealth: Payer: Self-pay

## 2022-10-28 DIAGNOSIS — C519 Malignant neoplasm of vulva, unspecified: Secondary | ICD-10-CM

## 2022-10-28 NOTE — Progress Notes (Unsigned)
Gynecologic Oncology Return Clinic Visit  10/28/22  Reason for Visit:  follow-up in the setting of vulvar cancer   Treatment History: Oncology History  Vulvar cancer ([place])  01/29/2020 Initial Diagnosis   Vulvar cancer ([place])   03/11/2020 - 05/04/2020 Chemotherapy   The patient had dexamethasone (DECADRON) 4 MG tablet, 4 mg (100 % of original dose 4 mg), Oral, Daily, 1 of 1 cycle, Start date: 02/27/2020, End date: 05/05/2020 Dose modification: 4 mg (original dose 4 mg, Cycle 0) palonosetron (ALOXI) injection 0.25 mg, 0.25 mg, Intravenous,  Once, 1 of 1 cycle Administration: 0.25 mg (03/11/2020), 0.25 mg (03/17/2020), 0.25 mg (03/24/2020), 0.25 mg (03/31/2020), 0.25 mg (04/07/2020), 0.25 mg (04/14/2020), 0.25 mg (04/21/2020) CARBOplatin (PARAPLATIN) 300 mg in sodium chloride 0.9 % 250 mL chemo infusion, 300 mg (100 % of original dose 300 mg), Intravenous,  Once, 1 of 1 cycle Dose modification:   (original dose 300 mg, Cycle 1) Administration: 300 mg (03/11/2020), 290 mg (03/17/2020), 300 mg (03/24/2020), 230 mg (03/31/2020), 230 mg (04/07/2020), 230 mg (04/14/2020) PACLitaxel (TAXOL) 108 mg in sodium chloride 0.9 % 250 mL chemo infusion (</= '80mg'$ /m2), 50 mg/m2 = 108 mg, Intravenous,  Once, 1 of 1 cycle Dose modification: 45 mg/m2 (original dose 50 mg/m2, Cycle 1, Reason: Dose not tolerated) Administration: 108 mg (03/11/2020), 108 mg (03/17/2020), 108 mg (03/24/2020), 96 mg (03/31/2020), 96 mg (04/07/2020), 96 mg (04/14/2020) fosaprepitant (EMEND) 150 mg in sodium chloride 0.9 % 145 mL IVPB, 150 mg, Intravenous,  Once, 1 of 1 cycle Administration: 150 mg (03/24/2020), 150 mg (03/31/2020), 150 mg (04/07/2020), 150 mg (04/14/2020) trastuzumab-anns (KANJINTI) 378 mg in sodium chloride 0.9 % 250 mL chemo infusion, 4 mg/kg = 378 mg (100 % of original dose 4 mg/kg), Intravenous,  Once, 1 of 1 cycle Dose modification: 4 mg/kg (original dose 4 mg/kg, Cycle 1, Reason: Other (see comments), Comment: loading dose) Administration: 378 mg  (03/11/2020), 189 mg (03/17/2020), 189 mg (03/24/2020), 189 mg (03/31/2020), 189 mg (04/07/2020), 189 mg (04/14/2020), 189 mg (04/21/2020), 189 mg (05/04/2020)  for chemotherapy treatment.    03/16/2020 - 05/08/2020 Radiation Therapy   Patient received 45 Gy in 25 treatments to pelvis Boost received 16.2 Gy in 9 treatments to vulva    Genetic Testing   Patient has genetic testing done for 06/22/2020. Results revealed patient has the following mutation(s): BRIP 1 mutation   Malignant neoplasm of upper-inner quadrant of left breast in female, estrogen receptor positive ([place])  02/20/2020 Initial Diagnosis   PET scan on 02/07/20 following a diagnosis of vulvar cancer showed a 1.7cm left breast mass and mildly hypermetabolic left axillary lymph nodes. Mammogram and [place] on 02/18/20 showed a 3.1cm left breast mass at the 11 o'clock position, palpable on exam, and a single abnormal left axillary lymph node with cortical thickening. Left breast biopsy on 02/20/20 showed invasive and in situ carcinoma in the breast and axilla, grade 3, HER-2 positive (3+), ER/PR+ 95%, Ki67 75%   02/25/2020 Cancer Staging   Staging form: Breast, AJCC 8th Edition - Clinical stage from 02/25/2020: Stage IB (cT2, cN1, cM0, G3, ER+, PR+, HER2+) - Signed by Nicholas Lose, MD on 02/25/2020   05/29/2020 - 05/29/2020 Chemotherapy   The patient had palonosetron (ALOXI) injection 0.25 mg, 0.25 mg, Intravenous,  Once, 1 of 3 cycles Administration: 0.25 mg (05/29/2020) pegfilgrastim-jmdb (FULPHILA) injection 6 mg, 6 mg, Subcutaneous,  Once, 1 of 3 cycles CARBOplatin (PARAPLATIN) 580 mg in sodium chloride 0.9 % 250 mL chemo infusion, 580 mg (100 % of  original dose 580 mg), Intravenous,  Once, 1 of 3 cycles Dose modification:   (original dose 580 mg, Cycle 1) Administration: 580 mg (05/29/2020) DOCEtaxel (TAXOTERE) 120 mg in sodium chloride 0.9 % 250 mL chemo infusion, 60 mg/m2 = 120 mg (100 % of original dose 60 mg/m2), Intravenous,  Once, 1 of 3  cycles Dose modification: 60 mg/m2 (original dose 60 mg/m2, Cycle 1, Reason: Provider Judgment), 50 mg/m2 (original dose 60 mg/m2, Cycle 2, Reason: Dose not tolerated) Administration: 120 mg (05/29/2020) fosaprepitant (EMEND) 150 mg in sodium chloride 0.9 % 145 mL IVPB, 150 mg, Intravenous,  Once, 1 of 3 cycles Administration: 150 mg (05/29/2020) pertuzumab (PERJETA) 420 mg in sodium chloride 0.9 % 250 mL chemo infusion, 420 mg (50 % of original dose 840 mg), Intravenous, Once, 1 of 3 cycles Dose modification: 420 mg (original dose 840 mg, Cycle 1, Reason: Provider Judgment) Administration: 420 mg (05/29/2020) trastuzumab-dkst (OGIVRI) 525 mg in sodium chloride 0.9 % 250 mL chemo infusion, 6 mg/kg = 525 mg (75 % of original dose 8 mg/kg), Intravenous,  Once, 1 of 3 cycles Dose modification: 6 mg/kg (original dose 8 mg/kg, Cycle 1, Reason: Provider Judgment) Administration: 525 mg (05/29/2020)  for chemotherapy treatment.    06/22/2020 Genetic Testing   Positive genetic testing: Heterozygous pathogenic variant detected in BRIP1 gene at c.2010dup (p.Glu671*).  Variant of uncertain significance detected in POLD1 at c.532G>A (p.Gly178Arg).  No other pathogenic variants detected in [facility] Common Hereditary Cancers Panel.  The report date is June 22, 2020.   The Common Hereditary Cancers Panel offered by [facility] includes sequencing and/or deletion duplication testing of the following 48 genes: APC, ATM, AXIN2, BARD1, BMPR1A, BRCA1, BRCA2, BRIP1, CDH1, CDK4, CDKN2A (p14ARF), CDKN2A (p16INK4a), CHEK2, CTNNA1, DICER1, EPCAM (Deletion/duplication testing only), GREM1 (promoter region deletion/duplication testing only), KIT, MEN1, MLH1, MSH2, MSH3, MSH6, MUTYH, NBN, NF1, NHTL1, PALB2, PDGFRA, PMS2, POLD1, POLE, PTEN, RAD50, RAD51C, RAD51D, RNF43, SDHB, SDHC, SDHD, SMAD4, SMARCA4. STK11, TP53, TSC1, TSC2, and VHL.  The following genes were evaluated for sequence changes only: SDHA and HOXB13 c.251G>A variant  only.   07/27/2020 Surgery   Left mastectomy [name]): three foci of IDC, 3.7cm, 1.5cm, and 1.2cm, with intermediate to high grade DCIS, clear margins, with 1/2 left axillary lymph nodes positive for carcinoma.    08/18/2020 - 04/06/2021 Chemotherapy   Patient is on Treatment Plan : BREAST ADO-Trastuzumab Emtansine ([place]la) q21d     09/09/2020 - 10/23/2020 Radiation Therapy   Adjuvant radiation    Ms Tewanna Schrey is a 47 year old parous woman with a history of vulvar cancer who was seen in consultation at the request of Dr Lindi Adie for evaluation of a deleterious mutation in [place].   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  At the time of seeking consultation for sentinel lymph node biopsy with Dr. Thurston Pounds at [facility], it was determined that she was not a good candidate for primary radical vulvectomy due to the close proximity of the lesion to her anal sphincter.  Therefore she was treated with definitive external beam radiation to the lower pelvis and vulvar tissues.  Radiation dosing was between 03/16/20 through 05/08/20. It consisted of IMRT to the pelvis (45 Gy) and IMRT boost to the vulva (16.2 Gy).   Simultaneous to her diagnosis of vulvar cancer was a diagnosis of ER/PR positive breast cancer on the left.  This was stage II with positive lymph node.  She underwent treatment with Adriamycin and Taxotere followed by left mastectomy, trastuzumab infusion for HER-2 positive lesion, and was planned for chest wall radiation beginning in the new year of 2022.   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   Screening ultrasound of the pelvis on 11/06/2020 revealed a uterus measuring 9.1 x 3.3 x 4.3 cm with an endometrial thickness of 6 mm and bilateral ovaries  which were normal in size and appearance.   On 05/18/21 she underwent robotic assisted total hysterectomy, BSO and vulvar biopsy. Intraoperative findings were significant for complete clinical response at vulva, normal appearing tubes and ovaries, normal appearing fibroid uterus. Surgery was uncomplicated.  Final pathology revealed benign tubes , ovaries, uterus. VIN 1 at vulva.   Since surgery she has done well with no major complaints.   Interval History: ***  Past Medical/Surgical History: Past Medical History:  Diagnosis Date   Arthritis    Bipolar 1 disorder ([place])    Cancer ([place])    vulva, and breast   Complication of anesthesia    wakes up during procedures   Depression    GAD (generalized anxiety disorder)    Genital HSV    currently per pt  no break out 03-22-2016    GERD (gastroesophageal reflux disease)    Graves disease    Hiatal hernia    History of cervical dysplasia    2012 laser ablation   History of esophageal dilatation    for dysphasia -- x2 dilated   History of gastric ulcer    History of Helicobacter pylori infection    remote hx   History of hidradenitis suppurativa    "gets all over body intermittantly"     History of hypertension    no issue since stopped drinking alcohol 2014   History of kidney stones    History of panic attacks    History of radiation therapy 03/16/2020-05/08/2020   vulva  Dr Gery Pray   History of radiation therapy 09/09/2020-10/23/2020   left chest wall/left SCV   Dr Gery Pray   Hypertension    Iron deficiency anemia    Left ureteral stone    OCD (obsessive compulsive disorder)    PONV (postoperative nausea and vomiting)    Pre-diabetes    PTSD (post-traumatic stress disorder)    Recovering alcoholic in remission ([place])    since 2014   RLS (restless legs syndrome)    Smokers' cough ([facility])    Thyroid disease    Urgency of urination    Yeast infection involving the vagina and surrounding area    secondary to taking  antibiotic    Past Surgical History:  Procedure Laterality Date   CESAREAN SECTION  1995   w/  Bilateral Tubal Ligation   COLONOSCOPY  last one 08-09-2013   CYSTOSCOPY W/ URETERAL STENT PLACEMENT Left 03/29/2016   Procedure: CYSTOSCOPY WITH STENT REPLACEMENT;  Surgeon: Nickie Retort, MD;  Location: [facility];  Service: Urology;  Laterality: Left;   CYSTOSCOPY WITH RETROGRADE PYELOGRAM, URETEROSCOPY AND STENT PLACEMENT Left 03/08/2016   Procedure: CYSTOSCOPY WITH  LEFT RETROGRADE PYELOGRAM, AND STENT PLACEMENT;  Surgeon: Nickie Retort, MD;  Location: [facility];  Service: Urology;  Laterality: Left;   CYSTOSCOPY/RETROGRADE/URETEROSCOPY/STONE EXTRACTION WITH BASKET Left 03/29/2016   Procedure: CYSTOSCOPY/RETROGRADE/URETEROSCOPY/STONE EXTRACTION WITH BASKET;  Surgeon: Aaron Edelman  Harolyn Rutherford, MD;  Location: [facility];  Service: Urology;  Laterality: Left;   ENDOMETRIAL ABLATION W/ NOVASURE  04-01-2010   ESOPHAGOGASTRODUODENOSCOPY  last one 08-09-2013   KNEE ARTHROSCOPY Left as teen   LASER ABLATION OF THE CERVIX  2012 approx   MASTECTOMY WITH AXILLARY LYMPH NODE DISSECTION Left 07/27/2020   Procedure: LEFT MASTECTOMY WITH LEFT RADIOACTIVE SEED GUIDED TARGETED AXILLARY LYMPH NODE DISSECTION;  Surgeon: Alphonsa Overall, MD;  Location: [facility];  Service: General;  Laterality: Left;   PORT-A-CATH REMOVAL Right 05/18/2021   Procedure: REMOVAL PORT-A-CATH;  Surgeon: Donnie Mesa, MD;  Location: [facility];  Service: General;  Laterality: Right;   PORTACATH PLACEMENT Right 03/06/2020   Procedure: INSERTION PORT-A-CATH WITH ULTRASOUND GUIDANCE;  Surgeon: Alphonsa Overall, MD;  Location: [facility];  Service: General;  Laterality: Right;   ROBOTIC ASSISTED TOTAL HYSTERECTOMY WITH BILATERAL SALPINGO OOPHERECTOMY N/A 05/18/2021   Procedure: XI ROBOTIC ASSISTED TOTAL HYSTERECTOMY WITH BILATERAL SALPINGO OOPHORECTOMY;  Surgeon: Everitt Amber, MD;  Location: [facility];  Service:  Gynecology;  Laterality: N/A;   TRANSTHORACIC ECHOCARDIOGRAM  05-19-2006   lvsf normal, ef 55-65%, there was mild flattening of the interventricular septum during diastoli/  RV size at upper limits normal   TUBAL LIGATION     VULVA /PERINEUM BIOPSY N/A 05/18/2021   Procedure: VULVAR BIOPSY;  Surgeon: Everitt Amber, MD;  Location: [facility];  Service: Gynecology;  Laterality: N/A;   WISDOM TOOTH EXTRACTION  age 34 's    Family History  Problem Relation Age of Onset   Heart disease Father    Lung cancer Father        d. 44   Alcohol abuse Father    Heart disease Mother    Depression Mother    Anxiety disorder Mother    Drug abuse Brother    Alcohol abuse Brother    Drug abuse Brother    ADD / ADHD Brother    Colon polyps Brother    Cancer Paternal Grandfather        "stomach"   Diabetes Maternal Grandfather    Diabetes Paternal Grandmother    Kidney disease Maternal Uncle    Cirrhosis Cousin        alcoholic   Anxiety disorder Maternal Aunt    Depression Maternal Aunt    Cancer Cousin        maternal; ovarian cancer or other "female" cancer?   Lung cancer Paternal Uncle 43   Throat cancer Cousin        paternal; dx 79s   Lung cancer Cousin        paternal; dx 54s    Social History   Socioeconomic History   Marital status: Single    Spouse name: Not on file   Number of children: Not on file   Years of education: Not on file   Highest education level: Not on file  Occupational History   Not on file  Tobacco Use   Smoking status: Every Day    Packs/day: 0.50    Years: 22.00    Total pack years: 11.00    Types: Cigarettes   Smokeless tobacco: Never   Tobacco comments:    Recently started a smoking cessation class.   Vaping Use   Vaping Use: Never used  Substance and Sexual Activity   Alcohol use: Not Currently   Drug use: Yes    Types: Marijuana   Sexual activity: Yes    Partners: Male    Birth  control/protection: Surgical  Other Topics Concern   Not on file   Social History Narrative   Former [facility] patient.      Was on disability at one point.   Return to the workforce.  40 hours a week at [employer], 10 hours a week on the weekends at [employer] in [place] at night 10 to 12 hours a week.      Has grown children, she lives alone with a pet, continues to smoke no alcohol or drug use at this time      History of EtOH abuse and THC use.         Social Determinants of Health   Financial Resource Strain: Low Risk  (07/01/2021)   Overall Financial Resource Strain (CARDIA)    Difficulty of Paying Living Expenses: Not hard at all  Food Insecurity: No Food Insecurity (07/01/2021)   Hunger Vital Sign    Worried About Running Out of Food in the Last Year: Never true    Ran Out of Food in the Last Year: Never true  Transportation Needs: No Transportation Needs (07/01/2021)   PRAPARE - Hydrologist (Medical): No    Lack of Transportation (Non-Medical): No  Physical Activity: Inactive (07/01/2021)   Exercise Vital Sign    Days of Exercise per Week: 0 days    Minutes of Exercise per Session: 0 min  Stress: Stress Concern Present (07/01/2021)   [place]    Feeling of Stress : Rather much  Social Connections: Socially Isolated (07/01/2021)   Social Connection and Isolation Panel [NHANES]    Frequency of Communication with Friends and Family: More than three times a week    Frequency of Social Gatherings with Friends and Family: More than three times a week    Attends Religious Services: Never    Marine scientist or Organizations: No    Attends Archivist Meetings: Never    Marital Status: Never married    Current Medications:  Current Outpatient Medications:    acetaminophen (TYLENOL) 500 MG tablet, Take 1 tablet (500 mg total) by mouth every 6 (six) hours as needed. (Patient taking differently: Take 500  mg by mouth as needed.), Disp: 30 tablet, Rfl: 0   diclofenac Sodium (VOLTAREN) 1 % GEL, Apply 4 grams topically 4  times daily as needed., Disp: 500 g, Rfl: 6   FLUoxetine (PROZAC) 10 MG capsule, Take 3 capsules (30 mg total) by mouth daily., Disp: 90 capsule, Rfl: 2   Fremanezumab-vfrm (AJOVY) 225 MG/1.5ML SOAJ, Inject 225 mg into the skin every 30 (thirty) days., Disp: 4.5 mL, Rfl: 0   gabapentin (NEURONTIN) 300 MG capsule, Take 1 capsule (300 mg total) by mouth daily. Pt needs office visit., Disp: 30 capsule, Rfl: 5   letrozole (FEMARA) 2.5 MG tablet, Take 1 tablet (2.5 mg total) by mouth daily., Disp: 90 tablet, Rfl: 3   lisinopril-hydrochlorothiazide (ZESTORETIC) 10-12.5 MG tablet, Take 1 tablet by mouth daily., Disp: 90 tablet, Rfl: 3   loratadine (CLARITIN) 10 MG tablet, Take 10 mg by mouth daily., Disp: , Rfl:    LORazepam (ATIVAN) 1 MG tablet, Take 1 tablet (1 mg total) by mouth 3 (three) times daily as needed for anxiety, Disp: 90 tablet, Rfl: 1   methimazole (TAPAZOLE) 5 MG tablet, Take one tablet (5 mg dose) by mouth daily., Disp: 90 tablet, Rfl: 3   Multiple Vitamin (MULTIVITAMIN WITH MINERALS) TABS  tablet, Take 1 tablet by mouth daily., Disp: , Rfl:    OLANZapine (ZYPREXA) 15 MG tablet, Take 1 tablet (15 mg total) by mouth at bedtime., Disp: 30 tablet, Rfl: 2   omeprazole (PRILOSEC) 40 MG capsule, Take 1 capsule (40 mg total) by mouth in the morning and at bedtime., Disp: 60 capsule, Rfl: 5   ondansetron (ZOFRAN-ODT) 8 MG disintegrating tablet, Dissolve 1 tablet (8 mg total) by mouth every 8 (eight) hours as needed for nausea or vomiting., Disp: 20 tablet, Rfl: 0   potassium chloride SA (KLOR-CON M) 20 MEQ tablet, Take 2 tablets (40 mEq total) by mouth daily., Disp: 60 tablet, Rfl: 3   propranolol (INDERAL) 10 MG tablet, Take 1 tablet by mouth 2 (two) times daily. (Patient taking differently: Take 10 mg by mouth as needed.), Disp: 180 tablet, Rfl: 1   rosuvastatin (CRESTOR) 10 MG  tablet, Take 1 tablet (10 mg total) by mouth daily., Disp: 90 tablet, Rfl: 3   senna-docusate (SENOKOT-S) 8.6-50 MG tablet, Take 2 tablets by mouth at bedtime. For AFTER surgery only, do not take if having diarrhea, Disp: 30 tablet, Rfl: 0   Ubrogepant (UBRELVY) 100 MG TABS, Take 1 tablet (100 mg total) by mouth every 2 (two) hours as needed. Maximum '200mg'$  a day., Disp: 16 tablet, Rfl: 11  Review of Systems: Denies appetite changes, fevers, chills, fatigue, unexplained weight changes. Denies hearing loss, neck lumps or masses, mouth sores, ringing in ears or voice changes. Denies cough or wheezing.  Denies shortness of breath. Denies chest pain or palpitations. Denies leg swelling. Denies abdominal distention, pain, blood in stools, constipation, diarrhea, nausea, vomiting, or early satiety. Denies pain with intercourse, dysuria, frequency, hematuria or incontinence. Denies hot flashes, pelvic pain, vaginal bleeding or vaginal discharge.   Denies joint pain, back pain or muscle pain/cramps. Denies itching, rash, or wounds. Denies dizziness, headaches, numbness or seizures. Denies swollen lymph nodes or glands, denies easy bruising or bleeding. Denies anxiety, depression, confusion, or decreased concentration.  Physical Exam: There were no vitals taken for this visit. General: ***Alert, oriented, no acute distress. HEENT: ***Posterior oropharynx clear, sclera anicteric. Chest: ***Clear to auscultation bilaterally.  ***Port site clean. Cardiovascular: ***Regular rate and rhythm, no murmurs. Abdomen: ***Obese, soft, nontender.  Normoactive bowel sounds.  No masses or hepatosplenomegaly appreciated.  ***Well-healed scar. Extremities: ***Grossly normal range of motion.  Warm, well perfused.  No edema bilaterally. Skin: ***No rashes or lesions noted. Lymphatics: ***No cervical, supraclavicular, or inguinal adenopathy. GU: Normal appearing external genitalia without erythema, excoriation, or  lesions.  Speculum exam reveals ***.  Bimanual exam reveals ***.  ***Rectovaginal exam  confirms ___.  Laboratory & Radiologic Studies: ***  Assessment & Plan: Sandra Miller is a 47 y.o. woman with a history of stage IB vulvar cancer s/p definitive radiation completed in 05/2020, s/p diagnosis of HR positive left breast cancer, and deleterious mutation in BRIP1 who underwent risk reducing hysterectomy and BSO in 05/2021.  Presents today for surveillance visit.   Patient is overall doing well and is NED on vulvar exam today.  Since she is now approximately 2.5 years from completing adjuvant therapy for her vulvar cancer, we will transition to visits every 6 months.   In the setting of her loose stools, we discussed bulking agents today.   I offered referral to our social workers here given her significant depression and anxiety related to cancer diagnoses.  Patient was amenable to this referral.  I sent a message in epic.  I also think that she would benefit from getting involved in some of our cancer support groups.   In terms of her deleterious mutation in BRIP 1 conferring increased hereditary risk of ovarian cancer, she is s/p robotic assisted total hysterectomy with BSO for risk reduction in September, 2022.   *** minutes of total time was spent for this patient encounter, including preparation, face-to-face counseling with the patient and coordination of care, and documentation of the encounter.  Jeral Pinch, MD  Division of Gynecologic Oncology  Department of Obstetrics and Gynecology  [facility]

## 2022-10-28 NOTE — Patient Instructions (Signed)
It was good to see you today.  I do not see or feel any evidence of cancer recurrence on your exam.  I will see you for follow-up in 9 months.  Please call my office in July or August to schedule a visit to see me in November.  As always, if you develop any new and concerning symptoms before your next visit, please call to see me sooner.

## 2022-10-28 NOTE — Telephone Encounter (Signed)
Ms Sandra Miller needs to cancel her appointment today with Dr. Berline Lopes at 1530 due to a family Emergency.  Our office will call her back next week with a new appointment as the schedule is currently full through March. Pt verbalized understanding.

## 2022-11-01 ENCOUNTER — Telehealth: Payer: Self-pay | Admitting: *Deleted

## 2022-11-01 NOTE — Telephone Encounter (Signed)
Spoke with the patient and rescheduled the appt to 3/29

## 2022-11-02 ENCOUNTER — Other Ambulatory Visit: Payer: Self-pay | Admitting: Neurology

## 2022-11-02 DIAGNOSIS — G43709 Chronic migraine without aura, not intractable, without status migrainosus: Secondary | ICD-10-CM

## 2022-11-03 ENCOUNTER — Other Ambulatory Visit (HOSPITAL_COMMUNITY): Payer: Self-pay

## 2022-11-03 MED ORDER — AJOVY 225 MG/1.5ML ~~LOC~~ SOAJ
225.0000 mg | SUBCUTANEOUS | 8 refills | Status: DC
  Filled 2022-11-03: qty 1.5, 30d supply, fill #0
  Filled 2022-12-01 – 2022-12-22 (×2): qty 1.5, 30d supply, fill #1
  Filled 2023-01-17: qty 1.5, 31d supply, fill #2
  Filled 2023-02-02 – 2023-02-03 (×2): qty 1.5, 30d supply, fill #2

## 2022-11-08 ENCOUNTER — Other Ambulatory Visit (HOSPITAL_COMMUNITY): Payer: Self-pay

## 2022-11-10 ENCOUNTER — Ambulatory Visit: Payer: No Typology Code available for payment source | Attending: Internal Medicine

## 2022-11-10 DIAGNOSIS — E876 Hypokalemia: Secondary | ICD-10-CM

## 2022-11-11 ENCOUNTER — Other Ambulatory Visit (HOSPITAL_COMMUNITY): Payer: Self-pay

## 2022-11-11 ENCOUNTER — Telehealth: Payer: Self-pay | Admitting: Internal Medicine

## 2022-11-11 DIAGNOSIS — E876 Hypokalemia: Secondary | ICD-10-CM

## 2022-11-11 MED ORDER — POTASSIUM CHLORIDE CRYS ER 20 MEQ PO TBCR
60.0000 meq | EXTENDED_RELEASE_TABLET | Freq: Every day | ORAL | 3 refills | Status: DC
  Filled 2022-11-11 – 2022-11-22 (×3): qty 90, 30d supply, fill #0

## 2022-11-11 NOTE — Telephone Encounter (Signed)
Patient informed per PCP "potassium level has improved but still low.  Lets increase the potassium supplement from 20 mEq 2 tablets daily to 3 tablets daily.  Please move up her appointment with me for sometime this month for [place] to address a change in her blood pressure medication.  Await results of hormone levels that were drawn as well. " Patient 's appointment moved to 12/08/2022 as requested.

## 2022-11-16 ENCOUNTER — Telehealth: Payer: Self-pay | Admitting: *Deleted

## 2022-11-16 NOTE — Telephone Encounter (Signed)
Moved patient's appt from 3/29 to 3/15

## 2022-11-18 ENCOUNTER — Other Ambulatory Visit (HOSPITAL_COMMUNITY): Payer: Self-pay

## 2022-11-18 ENCOUNTER — Telehealth: Payer: Self-pay

## 2022-11-18 ENCOUNTER — Other Ambulatory Visit: Payer: Self-pay

## 2022-11-18 ENCOUNTER — Inpatient Hospital Stay: Payer: No Typology Code available for payment source | Admitting: Gynecologic Oncology

## 2022-11-18 DIAGNOSIS — C519 Malignant neoplasm of vulva, unspecified: Secondary | ICD-10-CM

## 2022-11-18 NOTE — Telephone Encounter (Signed)
Pt called office this morning stating she is sick today and needs to reschedule her appointment with Dr. Berline Lopes.  Michelle Wheat notified and will call her to reschedule

## 2022-11-19 NOTE — Progress Notes (Unsigned)
Patient called on day of appt. Sick, requested to be rescheduled

## 2022-11-20 LAB — ALDOSTERONE + RENIN ACTIVITY W/ RATIO
Aldos/Renin Ratio: 0.1 (ref 0.0–30.0)
Aldosterone: 2.2 ng/dL (ref 0.0–30.0)
Renin Activity, Plasma: 19.262 ng/mL/hr — ABNORMAL HIGH (ref 0.167–5.380)

## 2022-11-20 LAB — POTASSIUM: Potassium: 3.3 mmol/L — ABNORMAL LOW (ref 3.5–5.2)

## 2022-11-21 ENCOUNTER — Telehealth: Payer: Self-pay | Admitting: *Deleted

## 2022-11-21 NOTE — Telephone Encounter (Signed)
LMOM for the patient to call the office back. Patient needs to be reschedule her missed appt with Dr Berline Lopes

## 2022-11-21 NOTE — Telephone Encounter (Unsigned)
Copied from [place] [id number]. Topic: General - Inquiry >> Nov 21, 2022 12:58 PM Ludger Nutting wrote: Patient called to discuss her lab results. Please follow up with patient.

## 2022-11-22 ENCOUNTER — Ambulatory Visit: Payer: No Typology Code available for payment source | Attending: Family Medicine | Admitting: Family Medicine

## 2022-11-22 ENCOUNTER — Other Ambulatory Visit (HOSPITAL_COMMUNITY): Payer: Self-pay

## 2022-11-22 ENCOUNTER — Encounter: Payer: Self-pay | Admitting: Family Medicine

## 2022-11-22 ENCOUNTER — Encounter: Payer: Self-pay | Admitting: Hematology and Oncology

## 2022-11-22 VITALS — BP 161/89 | HR 92 | Temp 99.4°F | Ht 65.0 in | Wt 201.6 lb

## 2022-11-22 DIAGNOSIS — K529 Noninfective gastroenteritis and colitis, unspecified: Secondary | ICD-10-CM

## 2022-11-22 MED ORDER — PROMETHAZINE HCL 25 MG PO TABS
25.0000 mg | ORAL_TABLET | Freq: Three times a day (TID) | ORAL | 0 refills | Status: DC | PRN
  Filled 2022-11-22: qty 20, 7d supply, fill #0

## 2022-11-22 NOTE — Progress Notes (Signed)
Nausea,vomiting,and diarrhea for 1 week.

## 2022-11-22 NOTE — Patient Instructions (Signed)
Food Choices to Help Relieve Diarrhea, Adult Diarrhea can make you feel weak and cause you to become dehydrated. Dehydration is a condition in which there is not enough water or other fluids in the body. It is important to choose the right foods and drinks to: Relieve diarrhea. Replace lost fluids and nutrients. Prevent dehydration. What are tips for following this plan? Relieving diarrhea Avoid foods that make your diarrhea worse. These may include: Foods and drinks that are sweetened with high-fructose corn syrup, honey, or sweeteners such as xylitol, sorbitol, and mannitol. Check food labels for these ingredients. Fried, greasy, or spicy foods. Raw fruits and vegetables. Eat foods that are rich in probiotics. These include foods such as yogurt and fermented milk products. Probiotics can help increase healthy bacteria in your stomach and intestines (gastrointestinal or GI tract). This may help digestion and stop diarrhea. If you have lactose intolerance, avoid dairy products. These may make your diarrhea worse. Take medicine to help stop diarrhea only as told by your health care provider. Replacing nutrients  Eat bland, easy-to-digest foods in small amounts as you are able, until your diarrhea starts to get better. These foods include bananas, applesauce, rice, toast, and crackers. Over time, add nutrient-rich foods as your body tolerates them or as told by your health care provider. These include: Well-cooked protein foods, such as eggs, lean meats like fish or chicken without skin, and tofu. Peeled, seeded, and soft-cooked fruits and vegetables. Low-fat dairy products. Whole grains. Take vitamin and mineral supplements as told by your health care provider. Preventing dehydration  Start by sipping water or a solution to prevent dehydration (oral rehydration solution, or ORS). This is a drink that helps replace fluids and minerals your body has lost. You can buy an ORS at pharmacies and  retail stores. Try to drink at least 8-10 cups (2,000-2,500 mL) of fluid each day to help replace lost fluids. If your urine is pale yellow, you are getting enough fluids. You may drink other liquids in addition to water, such as fruit juice that you have added water to (diluted fruit juice) or low-calorie sports drinks, as tolerated or as told by your health care provider. Avoid drinks with caffeine, such as coffee, tea, or soft drinks. Avoid alcohol. This information is not intended to replace advice given to you by your health care provider. Make sure you discuss any questions you have with your health care provider. Document Revised: 02/08/2022 Document Reviewed: 02/08/2022 Elsevier Patient Education  2023 Elsevier Inc.  

## 2022-11-22 NOTE — Progress Notes (Signed)
Subjective:  Patient ID: Sandra Miller, female    DOB: 24-Oct-1975  Age: 47 y.o. MRN: [id number]  CC: Nausea   HPI Sandra Miller is a 47 y.o. year old female patient of Dr Wynetta Emery with a history of GAD, Anxiety, Depression, Migraine, Vulvar cancer, breast cancer. Last visit for chronic disease management with PCP was in 08/2022.  Interval History:  She complains of 1 week history of vomiting initially followed by nausea. Took some zofran and phenergan. She has had diarrhea, lightheadedness. She has had diarrhea several times and had to take OTC antimotility agents. Denied presence of hematochezia, hematemesis. She broke out in a sweat at work and had to leave. Denies presence of fever, abdominal pain. She had a negative COVID test at work. Past Medical History:  Diagnosis Date   Arthritis    Bipolar 1 disorder ([place])    Cancer ([place])    vulva, and breast   Complication of anesthesia    wakes up during procedures   Depression    GAD (generalized anxiety disorder)    Genital HSV    currently per pt  no break out 03-22-2016    GERD (gastroesophageal reflux disease)    Graves disease    Hiatal hernia    History of cervical dysplasia    2012 laser ablation   History of esophageal dilatation    for dysphasia -- x2 dilated   History of gastric ulcer    History of Helicobacter pylori infection    remote hx   History of hidradenitis suppurativa    "gets all over body intermittantly"     History of hypertension    no issue since stopped drinking alcohol 2014   History of kidney stones    History of panic attacks    History of radiation therapy 03/16/2020-05/08/2020   vulva  Dr Gery Pray   History of radiation therapy 09/09/2020-10/23/2020   left chest wall/left SCV   Dr Gery Pray   Hypertension    Iron deficiency anemia    Left ureteral stone    OCD (obsessive compulsive disorder)    PONV (postoperative nausea and vomiting)    Pre-diabetes    PTSD (post-traumatic stress  disorder)    Recovering alcoholic in remission ([place])    since 2014   RLS (restless legs syndrome)    Smokers' cough ([facility])    Thyroid disease    Urgency of urination    Yeast infection involving the vagina and surrounding area    secondary to taking antibiotic    Past Surgical History:  Procedure Laterality Date   CESAREAN SECTION  1995   w/  Bilateral Tubal Ligation   COLONOSCOPY  last one 08-09-2013   CYSTOSCOPY W/ URETERAL STENT PLACEMENT Left 03/29/2016   Procedure: CYSTOSCOPY WITH STENT REPLACEMENT;  Surgeon: Nickie Retort, MD;  Location: [facility];  Service: Urology;  Laterality: Left;   CYSTOSCOPY WITH RETROGRADE PYELOGRAM, URETEROSCOPY AND STENT PLACEMENT Left 03/08/2016   Procedure: CYSTOSCOPY WITH  LEFT RETROGRADE PYELOGRAM, AND STENT PLACEMENT;  Surgeon: Nickie Retort, MD;  Location: [facility];  Service: Urology;  Laterality: Left;   CYSTOSCOPY/RETROGRADE/URETEROSCOPY/STONE EXTRACTION WITH BASKET Left 03/29/2016   Procedure: CYSTOSCOPY/RETROGRADE/URETEROSCOPY/STONE EXTRACTION WITH BASKET;  Surgeon: Nickie Retort, MD;  Location: [facility];  Service: Urology;  Laterality: Left;   ENDOMETRIAL ABLATION W/ NOVASURE  04-01-2010   ESOPHAGOGASTRODUODENOSCOPY  last one 08-09-2013   KNEE ARTHROSCOPY Left as teen   LASER ABLATION OF THE CERVIX  2012 approx   MASTECTOMY WITH AXILLARY LYMPH NODE DISSECTION Left 07/27/2020   Procedure: LEFT MASTECTOMY WITH LEFT RADIOACTIVE SEED GUIDED TARGETED AXILLARY LYMPH NODE DISSECTION;  Surgeon: Alphonsa Overall, MD;  Location: [place];  Service: General;  Laterality: Left;   PORT-A-CATH REMOVAL Right 05/18/2021   Procedure: REMOVAL PORT-A-CATH;  Surgeon: Donnie Mesa, MD;  Location: [facility];  Service: General;  Laterality: Right;   PORTACATH PLACEMENT Right 03/06/2020   Procedure: INSERTION PORT-A-CATH WITH ULTRASOUND GUIDANCE;  Surgeon: Alphonsa Overall, MD;  Location: [place];  Service: General;   Laterality: Right;   ROBOTIC ASSISTED TOTAL HYSTERECTOMY WITH BILATERAL SALPINGO OOPHERECTOMY N/A 05/18/2021   Procedure: XI ROBOTIC ASSISTED TOTAL HYSTERECTOMY WITH BILATERAL SALPINGO OOPHORECTOMY;  Surgeon: Everitt Amber, MD;  Location: [facility];  Service: Gynecology;  Laterality: N/A;   TRANSTHORACIC ECHOCARDIOGRAM  05-19-2006   lvsf normal, ef 55-65%, there was mild flattening of the interventricular septum during diastoli/  RV size at upper limits normal   TUBAL LIGATION     VULVA /PERINEUM BIOPSY N/A 05/18/2021   Procedure: VULVAR BIOPSY;  Surgeon: Everitt Amber, MD;  Location: [facility];  Service: Gynecology;  Laterality: N/A;   WISDOM TOOTH EXTRACTION  age 67 's    Family History  Problem Relation Age of Onset   Heart disease Father    Lung cancer Father        d. 73   Alcohol abuse Father    Heart disease Mother    Depression Mother    Anxiety disorder Mother    Drug abuse Brother    Alcohol abuse Brother    Drug abuse Brother    ADD / ADHD Brother    Colon polyps Brother    Cancer Paternal Grandfather        "stomach"   Diabetes Maternal Grandfather    Diabetes Paternal Grandmother    Kidney disease Maternal Uncle    Cirrhosis Cousin        alcoholic   Anxiety disorder Maternal Aunt    Depression Maternal Aunt    Cancer Cousin        maternal; ovarian cancer or other "female" cancer?   Lung cancer Paternal Uncle 24   Throat cancer Cousin        paternal; dx 9s   Lung cancer Cousin        paternal; dx 66s    Social History   Socioeconomic History   Marital status: Single    Spouse name: Not on file   Number of children: Not on file   Years of education: Not on file   Highest education level: Not on file  Occupational History   Not on file  Tobacco Use   Smoking status: Every Day    Packs/day: 0.50    Years: 22.00    Additional pack years: 0.00    Total pack years: 11.00    Types: Cigarettes   Smokeless tobacco: Never   Tobacco comments:    Recently started  a smoking cessation class.   Vaping Use   Vaping Use: Never used  Substance and Sexual Activity   Alcohol use: Not Currently   Drug use: Yes    Types: Marijuana   Sexual activity: Yes    Partners: Male    Birth control/protection: Surgical  Other Topics Concern   Not on file  Social History Narrative   Former [facility] patient.      Was on disability at one point.   Return to the workforce.  40 hours a week at [employer], 10 hours a week on the weekends at [employer] in [place] at night 10 to 12 hours a week.      Has grown children, she lives alone with a pet, continues to smoke no alcohol or drug use at this time      History of EtOH abuse and THC use.         Social Determinants of Health   Financial Resource Strain: Low Risk  (07/01/2021)   Overall Financial Resource Strain (CARDIA)    Difficulty of Paying Living Expenses: Not hard at all  Food Insecurity: No Food Insecurity (07/01/2021)   Hunger Vital Sign    Worried About Running Out of Food in the Last Year: Never true    Ran Out of Food in the Last Year: Never true  Transportation Needs: No Transportation Needs (07/01/2021)   PRAPARE - Hydrologist (Medical): No    Lack of Transportation (Non-Medical): No  Physical Activity: Inactive (07/01/2021)   Exercise Vital Sign    Days of Exercise per Week: 0 days    Minutes of Exercise per Session: 0 min  Stress: Stress Concern Present (07/01/2021)   [place]    Feeling of Stress : Rather much  Social Connections: Socially Isolated (07/01/2021)   Social Connection and Isolation Panel [NHANES]    Frequency of Communication with Friends and Family: More than three times a week    Frequency of Social Gatherings with Friends and Family: More than three times a week    Attends Religious Services: Never    Marine scientist or Organizations: No     Attends Music therapist: Never    Marital Status: Never married    Allergies  Allergen Reactions   Dilaudid [Hydromorphone Hcl] Other (See Comments)    Pt became confused, pulled out iv's, does not remember anything   Aspirin Hives    States able to tolerate Goody Powders and Ibuprofen without any problem    Depakote [Divalproex Sodium] Nausea And Vomiting   Minocycline Hives    Outpatient Medications Prior to Visit  Medication Sig Dispense Refill   acetaminophen (TYLENOL) 500 MG tablet Take 1 tablet (500 mg total) by mouth every 6 (six) hours as needed. (Patient taking differently: Take 500 mg by mouth as needed.) 30 tablet 0   diclofenac Sodium (VOLTAREN) 1 % GEL Apply 4 grams topically 4  times daily as needed. 500 g 6   FLUoxetine (PROZAC) 10 MG capsule Take 3 capsules (30 mg total) by mouth daily. 90 capsule 2   Fremanezumab-vfrm (AJOVY) 225 MG/1.5ML SOAJ Inject 225 mg into the skin every 30 (thirty) days. 1.5 mL 8   gabapentin (NEURONTIN) 300 MG capsule Take 1 capsule (300 mg total) by mouth daily. Pt needs office visit. 30 capsule 5   letrozole (FEMARA) 2.5 MG tablet Take 1 tablet (2.5 mg total) by mouth daily. 90 tablet 3   lisinopril-hydrochlorothiazide (ZESTORETIC) 10-12.5 MG tablet Take 1 tablet by mouth daily. 90 tablet 3   loratadine (CLARITIN) 10 MG tablet Take 10 mg by mouth daily.     LORazepam (ATIVAN) 1 MG tablet Take 1 tablet (1 mg total) by mouth 3 (three) times daily as needed for anxiety 90 tablet 1   methimazole (TAPAZOLE) 5 MG tablet Take one tablet (5 mg dose) by mouth daily. 90 tablet 3   Multiple Vitamin (MULTIVITAMIN  WITH MINERALS) TABS tablet Take 1 tablet by mouth daily.     OLANZapine (ZYPREXA) 15 MG tablet Take 1 tablet (15 mg total) by mouth at bedtime. 30 tablet 2   omeprazole (PRILOSEC) 40 MG capsule Take 1 capsule (40 mg total) by mouth in the morning and at bedtime. 60 capsule 5   ondansetron (ZOFRAN-ODT) 8 MG disintegrating tablet  Dissolve 1 tablet (8 mg total) by mouth every 8 (eight) hours as needed for nausea or vomiting. 20 tablet 0   potassium chloride SA (KLOR-CON M) 20 MEQ tablet Take 3 tablets (60 mEq total) by mouth daily. 90 tablet 3   propranolol (INDERAL) 10 MG tablet Take 1 tablet by mouth 2 (two) times daily. (Patient taking differently: Take 10 mg by mouth as needed.) 180 tablet 1   rosuvastatin (CRESTOR) 10 MG tablet Take 1 tablet (10 mg total) by mouth daily. 90 tablet 3   senna-docusate (SENOKOT-S) 8.6-50 MG tablet Take 2 tablets by mouth at bedtime. For AFTER surgery only, do not take if having diarrhea 30 tablet 0   Ubrogepant (UBRELVY) 100 MG TABS Take 1 tablet (100 mg total) by mouth every 2 (two) hours as needed. Maximum 200mg  a day. 16 tablet 11   No facility-administered medications prior to visit.     ROS Review of Systems  Constitutional:  Negative for activity change and appetite change.  HENT:  Negative for sinus pressure and sore throat.   Respiratory:  Negative for chest tightness, shortness of breath and wheezing.   Cardiovascular:  Negative for chest pain and palpitations.  Gastrointestinal:  Positive for diarrhea, nausea and vomiting. Negative for abdominal distention, abdominal pain and constipation.  Genitourinary: Negative.   Musculoskeletal: Negative.   Psychiatric/Behavioral:  Negative for behavioral problems and dysphoric mood.     Objective:  BP (!) 161/89   Pulse 92   Temp 99.4 F (37.4 C) (Oral)   Ht 5\' 5"  (1.651 m)   Wt 201 lb 9.6 oz (91.4 kg)   SpO2 99%   BMI 33.55 kg/m      11/22/2022    3:35 PM 10/12/2022    5:26 PM 09/30/2022    3:27 PM  BP/Weight  Systolic BP 542 706 237  Diastolic BP 89 628 77  Wt. (Lbs) 201.6    BMI 33.55 kg/m2        Physical Exam Constitutional:      Appearance: She is well-developed.  Cardiovascular:     Rate and Rhythm: Normal rate.     Heart sounds: Normal heart sounds. No murmur heard. Pulmonary:     Effort: Pulmonary  effort is normal.     Breath sounds: Normal breath sounds. No wheezing or rales.  Chest:     Chest wall: No tenderness.  Abdominal:     General: Bowel sounds are normal. There is no distension.     Palpations: Abdomen is soft. There is no mass.     Tenderness: There is no abdominal tenderness.  Musculoskeletal:        General: Normal range of motion.     Right lower leg: No edema.     Left lower leg: No edema.  Neurological:     Mental Status: She is alert and oriented to person, place, and time.  Psychiatric:        Mood and Affect: Mood normal.        Latest Ref Rng & Units 11/10/2022   12:31 PM 10/24/2022    3:28 PM 10/17/2022  3:47 PM  CMP  Glucose 70 - 99 mg/dL  104  83   BUN 6 - 24 mg/dL  8  5   Creatinine 0.57 - 1.00 mg/dL  0.68  0.68   Sodium 134 - 144 mmol/L  141  149   Potassium 3.5 - 5.2 mmol/L 3.3  2.9  4.2   Chloride 96 - 106 mmol/L  99  97   CO2 20 - 29 mmol/L  26  23   Calcium 8.7 - 10.2 mg/dL  9.5  10.0     Lipid Panel     Component Value Date/Time   CHOL 159 08/18/2022 1605   TRIG 120 08/18/2022 1605   HDL 51 08/18/2022 1605   CHOLHDL 3.1 08/18/2022 1605   CHOLHDL 4.7 Ratio 06/13/2008 2303   VLDL 32 06/13/2008 2303   LDLCALC 87 08/18/2022 1605    CBC    Component Value Date/Time   WBC 9.6 10/12/2022 1838   RBC 4.12 10/12/2022 1838   HGB 12.1 10/12/2022 1838   HGB 11.3 (L) 07/01/2021 0926   HGB 12.9 07/09/2018 0959   HCT 36.4 10/12/2022 1838   HCT 37.2 07/09/2018 0959   PLT 303 10/12/2022 1838   PLT 296 07/01/2021 0926   PLT 356 07/09/2018 0959   MCV 88.3 10/12/2022 1838   MCV 84 07/09/2018 0959   MCH 29.4 10/12/2022 1838   MCHC 33.2 10/12/2022 1838   RDW 14.6 10/12/2022 1838   RDW 13.7 07/09/2018 0959   LYMPHSABS 3.7 10/12/2022 1838   MONOABS 0.5 10/12/2022 1838   EOSABS 0.1 10/12/2022 1838   BASOSABS 0.0 10/12/2022 1838    Lab Results  Component Value Date   HGBA1C 5.8 08/18/2022    Assessment & Plan:  1.  Gastroenteritis Symptoms have been present x 6 days Likely viral etiology Advised to stay hydrated - Basic Metabolic Panel - CBC with Differential/Platelet - promethazine (PHENERGAN) 25 MG tablet; Take 1 tablet (25 mg total) by mouth every 8 (eight) hours as needed for nausea or vomiting.  Dispense: 20 tablet; Refill: 0    Meds ordered this encounter  Medications   promethazine (PHENERGAN) 25 MG tablet    Sig: Take 1 tablet (25 mg total) by mouth every 8 (eight) hours as needed for nausea or vomiting.    Dispense:  20 tablet    Refill:  0    Follow-up: Return for previously scheduled appointment.       Charlott Rakes, MD, FAAFP. [facility] [place], [place]   11/22/2022, 5:37 PM

## 2022-11-23 ENCOUNTER — Encounter: Payer: Self-pay | Admitting: Hematology and Oncology

## 2022-11-23 LAB — BASIC METABOLIC PANEL
BUN/Creatinine Ratio: 10 (ref 9–23)
BUN: 7 mg/dL (ref 6–24)
CO2: 25 mmol/L (ref 20–29)
Calcium: 10.1 mg/dL (ref 8.7–10.2)
Chloride: 100 mmol/L (ref 96–106)
Creatinine, Ser: 0.7 mg/dL (ref 0.57–1.00)
Glucose: 91 mg/dL (ref 70–99)
Potassium: 3.8 mmol/L (ref 3.5–5.2)
Sodium: 141 mmol/L (ref 134–144)
eGFR: 107 mL/min/{1.73_m2} (ref >59)

## 2022-11-23 LAB — CBC WITH DIFFERENTIAL/PLATELET
Basophils Absolute: 0 10*3/uL (ref 0.0–0.2)
Basos: 0 %
EOS (ABSOLUTE): 0.1 10*3/uL (ref 0.0–0.4)
Eos: 1 %
Hematocrit: 38.6 % (ref 34.0–46.6)
Hemoglobin: 12.9 g/dL (ref 11.1–15.9)
Immature Grans (Abs): 0 10*3/uL (ref 0.0–0.1)
Immature Granulocytes: 0 %
Lymphocytes Absolute: 3.9 10*3/uL — ABNORMAL HIGH (ref 0.7–3.1)
Lymphs: 39 %
MCH: 29.6 pg (ref 26.6–33.0)
MCHC: 33.4 g/dL (ref 31.5–35.7)
MCV: 89 fL (ref 79–97)
Monocytes Absolute: 0.5 10*3/uL (ref 0.1–0.9)
Monocytes: 5 %
Neutrophils Absolute: 5.5 10*3/uL (ref 1.4–7.0)
Neutrophils: 55 %
Platelets: 318 10*3/uL (ref 150–450)
RBC: 4.36 x10E6/uL (ref 3.77–5.28)
RDW: 13.6 % (ref 11.7–15.4)
WBC: 10.1 10*3/uL (ref 3.4–10.8)

## 2022-11-23 NOTE — Telephone Encounter (Signed)
Results and message were sent via Mychart by Dr.Johnson and will discuss on next visit. Message "seen" by patient.

## 2022-11-23 NOTE — Telephone Encounter (Signed)
Spoke with the patient and rescheduled appt to 3/274 with Joylene John APP

## 2022-11-25 ENCOUNTER — Other Ambulatory Visit: Payer: Self-pay | Admitting: Hematology and Oncology

## 2022-11-25 ENCOUNTER — Other Ambulatory Visit (HOSPITAL_COMMUNITY): Payer: Self-pay

## 2022-11-25 MED ORDER — LETROZOLE 2.5 MG PO TABS
2.5000 mg | ORAL_TABLET | Freq: Every day | ORAL | 3 refills | Status: DC
  Filled 2022-11-25: qty 30, 30d supply, fill #0

## 2022-11-28 ENCOUNTER — Encounter: Payer: Self-pay | Admitting: Gynecologic Oncology

## 2022-11-28 ENCOUNTER — Other Ambulatory Visit (HOSPITAL_COMMUNITY): Payer: Self-pay

## 2022-11-30 ENCOUNTER — Inpatient Hospital Stay: Payer: No Typology Code available for payment source | Attending: Gynecologic Oncology | Admitting: Gynecologic Oncology

## 2022-11-30 DIAGNOSIS — C519 Malignant neoplasm of vulva, unspecified: Secondary | ICD-10-CM

## 2022-12-01 ENCOUNTER — Encounter: Payer: Self-pay | Admitting: Neurology

## 2022-12-01 ENCOUNTER — Other Ambulatory Visit (HOSPITAL_COMMUNITY): Payer: Self-pay

## 2022-12-01 ENCOUNTER — Other Ambulatory Visit: Payer: Self-pay | Admitting: Hematology and Oncology

## 2022-12-01 ENCOUNTER — Other Ambulatory Visit: Payer: Self-pay | Admitting: *Deleted

## 2022-12-01 MED ORDER — LETROZOLE 2.5 MG PO TABS
2.5000 mg | ORAL_TABLET | Freq: Every day | ORAL | 3 refills | Status: DC
  Filled 2022-12-01: qty 30, 30d supply, fill #0
  Filled 2022-12-30: qty 30, 30d supply, fill #1
  Filled 2023-02-02 (×2): qty 30, 30d supply, fill #2
  Filled 2023-03-02: qty 30, 30d supply, fill #3
  Filled 2023-03-24: qty 30, 30d supply, fill #4
  Filled 2023-04-13 – 2023-05-12 (×2): qty 30, 30d supply, fill #5
  Filled 2023-06-11: qty 30, 30d supply, fill #6
  Filled 2023-07-11: qty 30, 30d supply, fill #7
  Filled 2023-08-10: qty 30, 30d supply, fill #8
  Filled 2023-09-09 – 2023-09-13 (×3): qty 30, 30d supply, fill #9
  Filled 2023-10-08: qty 30, 30d supply, fill #10

## 2022-12-01 MED ORDER — LETROZOLE 2.5 MG PO TABS: 2.5000 mg | ORAL_TABLET | Freq: Every day | ORAL | 3 refills | Status: DC

## 2022-12-02 ENCOUNTER — Ambulatory Visit: Payer: No Typology Code available for payment source | Admitting: Gynecologic Oncology

## 2022-12-02 ENCOUNTER — Other Ambulatory Visit (HOSPITAL_COMMUNITY): Payer: Self-pay

## 2022-12-06 ENCOUNTER — Ambulatory Visit: Payer: No Typology Code available for payment source | Admitting: Internal Medicine

## 2022-12-08 ENCOUNTER — Other Ambulatory Visit: Payer: Self-pay

## 2022-12-08 ENCOUNTER — Ambulatory Visit: Payer: No Typology Code available for payment source | Attending: Internal Medicine | Admitting: Internal Medicine

## 2022-12-08 ENCOUNTER — Encounter (HOSPITAL_COMMUNITY): Payer: Self-pay | Admitting: Psychiatry

## 2022-12-08 ENCOUNTER — Telehealth (HOSPITAL_COMMUNITY): Payer: No Typology Code available for payment source | Admitting: Psychiatry

## 2022-12-08 ENCOUNTER — Other Ambulatory Visit (HOSPITAL_COMMUNITY): Payer: Self-pay

## 2022-12-08 ENCOUNTER — Encounter: Payer: Self-pay | Admitting: Internal Medicine

## 2022-12-08 VITALS — Wt 201.0 lb

## 2022-12-08 VITALS — BP 134/86 | HR 101 | Temp 99.0°F | Ht 65.0 in | Wt 199.0 lb

## 2022-12-08 DIAGNOSIS — F411 Generalized anxiety disorder: Secondary | ICD-10-CM

## 2022-12-08 DIAGNOSIS — F321 Major depressive disorder, single episode, moderate: Secondary | ICD-10-CM

## 2022-12-08 DIAGNOSIS — F17210 Nicotine dependence, cigarettes, uncomplicated: Secondary | ICD-10-CM

## 2022-12-08 DIAGNOSIS — F319 Bipolar disorder, unspecified: Secondary | ICD-10-CM

## 2022-12-08 DIAGNOSIS — F17200 Nicotine dependence, unspecified, uncomplicated: Secondary | ICD-10-CM

## 2022-12-08 DIAGNOSIS — E782 Mixed hyperlipidemia: Secondary | ICD-10-CM | POA: Diagnosis not present

## 2022-12-08 DIAGNOSIS — E876 Hypokalemia: Secondary | ICD-10-CM

## 2022-12-08 DIAGNOSIS — I10 Essential (primary) hypertension: Secondary | ICD-10-CM

## 2022-12-08 MED ORDER — FLUOXETINE HCL 10 MG PO CAPS
30.0000 mg | ORAL_CAPSULE | Freq: Every day | ORAL | 2 refills | Status: DC
  Filled 2022-12-08 – 2022-12-18 (×2): qty 90, 30d supply, fill #0
  Filled 2023-01-17: qty 90, 30d supply, fill #1
  Filled 2023-02-15: qty 90, 30d supply, fill #2

## 2022-12-08 MED ORDER — OLANZAPINE 15 MG PO TABS
15.0000 mg | ORAL_TABLET | Freq: Every day | ORAL | 2 refills | Status: DC
  Filled 2022-12-08: qty 30, 30d supply, fill #0
  Filled 2023-01-04: qty 30, 30d supply, fill #1
  Filled 2023-02-02: qty 30, 30d supply, fill #2

## 2022-12-08 MED ORDER — LORAZEPAM 1 MG PO TABS
1.0000 mg | ORAL_TABLET | Freq: Three times a day (TID) | ORAL | 1 refills | Status: DC | PRN
  Filled 2022-12-08: qty 90, 30d supply, fill #0
  Filled 2023-02-02 (×2): qty 90, 30d supply, fill #1

## 2022-12-08 MED ORDER — LISINOPRIL 10 MG PO TABS
10.0000 mg | ORAL_TABLET | Freq: Every day | ORAL | 1 refills | Status: DC
  Filled 2022-12-08: qty 9, 9d supply, fill #0
  Filled 2022-12-09: qty 21, 21d supply, fill #0
  Filled 2023-01-04: qty 30, 30d supply, fill #1
  Filled 2023-02-02 (×2): qty 30, 30d supply, fill #2
  Filled 2023-03-02: qty 30, 30d supply, fill #3
  Filled 2023-04-13: qty 30, 30d supply, fill #4
  Filled 2023-05-17: qty 30, 30d supply, fill #5

## 2022-12-08 MED ORDER — AMLODIPINE BESYLATE 5 MG PO TABS
5.0000 mg | ORAL_TABLET | Freq: Every day | ORAL | 1 refills | Status: DC
  Filled 2022-12-08: qty 30, 30d supply, fill #0
  Filled 2023-01-04: qty 30, 30d supply, fill #1
  Filled 2023-02-02 (×2): qty 30, 30d supply, fill #2

## 2022-12-08 NOTE — Patient Instructions (Signed)
Stop lisinopril/hydrochlorothiazide. Start lisinopril 10 mg daily and amlodipine 5 mg daily. After on your new blood pressure medicines for 1 week, please return to the lab to have blood test done.  Follow-up with our clinical pharmacist in 1 month for repeat blood pressure check.

## 2022-12-08 NOTE — Telephone Encounter (Signed)
PA done for ajovy on [employer] EOC ID [id number].

## 2022-12-08 NOTE — Progress Notes (Signed)
[facility] MD Virtual Progress Note   Patient Location: Home Provider Location: Home Office  I connect with patient by telephone and verified that I am speaking with correct person by using two identifiers. I discussed the limitations of evaluation and management by telemedicine and the availability of in person appointments. I also discussed with the patient that there may be a patient responsible charge related to this service. The patient expressed understanding and agreed to proceed.  Sandra LOSACCO RN:[id number] 47 y.o.  12/08/2022 10:30 AM  History of Present Illness:  Patient is evaluated by phone session.  She reported a lot of health issues and past few weeks.  She had a viral infection, low potassium and headaches.  She was given a new medication for headaches which was working but now her insurance does not approve any thought authorization.  She is taking potassium because her potassium was very low.  She still have a lot of pain in her knee and she believes there may be a fluid accumulation in the joint.  She has to take few days off from the work and she is in the process of FMLA from her provider so she can take time out when she has flareup.  She feels sometimes frustrated because of chronic health issues but denies any hallucination, paranoia or any suicidal thoughts.  She sleeps good because she is very tired when she comes home from the work.  She enjoys the company of her 6-year-old grandchild.  She reported chronic fatigue but denies any tremors or shakes or any EPS.  She had a good support from her family.  She is taking olanzapine, Prozac and Ativan.  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  Her appetite is okay and her weight is stable.  She denies any panic attacks, mania, psychosis.  She has no tremors or shakes.  Past Psychiatric History: H/O bipolar disorder, alcohol dependence and marijuana abuse.  H/O at least 6 inpatient. Tried lithium,  Seroquel, Lamictal, Paxil, Celexa, Abilify, Geodon, Klonopin, Depakote, Neurontin and Cymbalta.  No H/o history of suicidal attempt.     Outpatient Encounter Medications as of 12/08/2022  Medication Sig   acetaminophen (TYLENOL) 500 MG tablet Take 1 tablet (500 mg total) by mouth every 6 (six) hours as needed.   diclofenac Sodium (VOLTAREN) 1 % GEL Apply 4 grams topically 4  times daily as needed.   FLUoxetine (PROZAC) 10 MG capsule Take 3 capsules (30 mg total) by mouth daily.   Fremanezumab-vfrm (AJOVY) 225 MG/1.5ML SOAJ Inject 225 mg into the skin every 30 (thirty) days.   gabapentin (NEURONTIN) 300 MG capsule Take 1 capsule (300 mg total) by mouth daily. Pt needs office visit.   letrozole (FEMARA) 2.5 MG tablet Take 1 tablet by mouth daily.   letrozole (FEMARA) 2.5 MG tablet Take 1 tablet (2.5 mg total) by mouth daily.   lisinopril-hydrochlorothiazide (ZESTORETIC) 10-12.5 MG tablet Take 1 tablet by mouth daily.   loratadine (CLARITIN) 10 MG tablet Take 10 mg by mouth daily.   LORazepam (ATIVAN) 1 MG tablet Take 1 tablet (1 mg total) by mouth 3 (three) times daily as needed for anxiety   methimazole (TAPAZOLE) 5 MG tablet Take one tablet (5 mg dose) by mouth daily.   Multiple Vitamin (MULTIVITAMIN WITH MINERALS) TABS tablet Take 1 tablet by mouth daily.   OLANZapine (ZYPREXA) 15 MG tablet Take 1 tablet (15 mg total) by mouth at bedtime.   omeprazole (PRILOSEC) 40  MG capsule Take 1 capsule (40 mg total) by mouth in the morning and at bedtime.   ondansetron (ZOFRAN-ODT) 8 MG disintegrating tablet Dissolve 1 tablet (8 mg total) by mouth every 8 (eight) hours as needed for nausea or vomiting.   potassium chloride SA (KLOR-CON M) 20 MEQ tablet Take 3 tablets (60 mEq total) by mouth daily.   promethazine (PHENERGAN) 25 MG tablet Take 1 tablet (25 mg total) by mouth every 8 (eight) hours as needed for nausea or vomiting.   propranolol (INDERAL) 10 MG tablet Take 1 tablet by mouth 2 (two) times daily.  (Patient taking differently: Take 10 mg by mouth as needed.)   rosuvastatin (CRESTOR) 10 MG tablet Take 1 tablet (10 mg total) by mouth daily.   senna-docusate (SENOKOT-S) 8.6-50 MG tablet Take 2 tablets by mouth at bedtime. For AFTER surgery only, do not take if having diarrhea   Ubrogepant (UBRELVY) 100 MG TABS Take 1 tablet (100 mg total) by mouth every 2 (two) hours as needed. Maximum 200mg  a day.   [DISCONTINUED] prochlorperazine (COMPAZINE) 10 MG tablet TAKE 1 TABLET BY MOUTH EVERY 6 HOURS AS NEEDED FOR NAUSEA OR VOMITING (Patient taking differently: Take 10 mg by mouth every 6 (six) hours as needed for nausea or vomiting.)   No facility-administered encounter medications on file as of 12/08/2022.    Recent Results (from the past 2160 hour(s))  SARS CORONAVIRUS 2 (TAT 6-24 HRS) Anterior Nasal Swab     Status: None   Collection Time: 09/30/22  4:03 PM   Specimen: Anterior Nasal Swab  Result Value Ref Range   SARS Coronavirus 2 NEGATIVE NEGATIVE    Comment: (NOTE) SARS-CoV-2 target nucleic acids are NOT DETECTED.  The SARS-CoV-2 RNA is generally detectable in upper and lower respiratory specimens during the acute phase of infection. Negative results do not preclude SARS-CoV-2 infection, do not rule out co-infections with other pathogens, and should not be used as the sole basis for treatment or other patient management decisions. Negative results must be combined with clinical observations, patient history, and epidemiological information. The expected result is Negative.  Fact Sheet for Patients: SugarRoll.be  Fact Sheet for Healthcare Providers: https://www.woods-mathews.com/  This test is not yet approved or cleared by the [place] FDA and  has been authorized for detection and/or diagnosis of SARS-CoV-2 by FDA under an Emergency Use Authorization (EUA). This EUA will remain  in effect (meaning this test can be used) for the  duration of the COVID-19 declaration under Se ction 564(b)(1) of the Act, 21 U.S.C. section 360bbb-3(b)(1), unless the authorization is terminated or revoked sooner.  Performed at [facility], [place] [address] [place], [place] [address]   CBC with Differential     Status: None   Collection Time: 10/12/22  6:38 PM  Result Value Ref Range   WBC 9.6 4.0 - 10.5 K/uL   RBC 4.12 3.87 - 5.11 MIL/uL   Hemoglobin 12.1 12.0 - 15.0 g/dL   HCT 36.4 36.0 - 46.0 %   MCV 88.3 80.0 - 100.0 fL   MCH 29.4 26.0 - 34.0 pg   MCHC 33.2 30.0 - 36.0 g/dL   RDW 14.6 11.5 - 15.5 %   Platelets 303 150 - 400 K/uL   nRBC 0.0 0.0 - 0.2 %   Neutrophils Relative % 55 %   Neutro Abs 5.2 1.7 - 7.7 K/uL   Lymphocytes Relative 39 %   Lymphs Abs 3.7 0.7 - 4.0 K/uL   Monocytes Relative  5 %   Monocytes Absolute 0.5 0.1 - 1.0 K/uL   Eosinophils Relative 1 %   Eosinophils Absolute 0.1 0.0 - 0.5 K/uL   Basophils Relative 0 %   Basophils Absolute 0.0 0.0 - 0.1 K/uL   Immature Granulocytes 0 %   Abs Immature Granulocytes 0.03 0.00 - 0.07 K/uL    Comment: Performed at [facility], [place] [address], [place], [place] [address]  Comprehensive metabolic panel     Status: Abnormal   Collection Time: 10/12/22  6:38 PM  Result Value Ref Range   Sodium 142 135 - 145 mmol/L   Potassium 2.7 (LL) 3.5 - 5.1 mmol/L    Comment: CRITICAL RESULT CALLED TO, READ BACK BY AND VERIFIED WITH rn tj rivers at [place] 10/12/22 [place] a    Chloride 104 98 - 111 mmol/L   CO2 27 22 - 32 mmol/L   Glucose, Bld 95 70 - 99 mg/dL    Comment: Glucose reference range applies only to samples taken after fasting for at least 8 hours.   BUN 6 6 - 20 mg/dL   Creatinine, Ser 0.63 0.44 - 1.00 mg/dL   Calcium 9.2 8.9 - 10.3 mg/dL   Total Protein 7.0 6.5 - 8.1 g/dL   Albumin 4.1 3.5 - 5.0 g/dL   AST 21 15 - 41 U/L   ALT 17 0 - 44 U/L   Alkaline Phosphatase 57 38 - 126 U/L   Total Bilirubin 0.6 0.3 - 1.2 mg/dL   GFR,  Estimated >60 >60 mL/min    Comment: (NOTE) Calculated using the CKD-EPI Creatinine Equation (2021)    Anion gap 11 5 - 15    Comment: Performed at [facility], [place] [address] [place], [place] [address]  Mononucleosis screen     Status: None   Collection Time: 10/12/22  6:38 PM  Result Value Ref Range   Mono Screen NEGATIVE NEGATIVE    Comment: Performed at [facility], [place] [address], [place], [place] [id number]  Basic Metabolic Panel     Status: Abnormal   Collection Time: 10/17/22  3:47 PM  Result Value Ref Range   Glucose 83 70 - 99 mg/dL   BUN 5 (L) 6 - 24 mg/dL   Creatinine, Ser 0.68 0.57 - 1.00 mg/dL   eGFR 107 >59 mL/min/1.73   BUN/Creatinine Ratio 7 (L) 9 - 23   Sodium 149 (H) 134 - 144 mmol/L   Potassium 4.2 3.5 - 5.2 mmol/L   Chloride 97 96 - 106 mmol/L   CO2 23 20 - 29 mmol/L   Calcium 10.0 8.7 - 10.2 mg/dL  Basic Metabolic Panel     Status: Abnormal   Collection Time: 10/24/22  3:28 PM  Result Value Ref Range   Glucose 104 (H) 70 - 99 mg/dL   BUN 8 6 - 24 mg/dL   Creatinine, Ser 0.68 0.57 - 1.00 mg/dL   eGFR 107 >59 mL/min/1.73   BUN/Creatinine Ratio 12 9 - 23   Sodium 141 134 - 144 mmol/L   Potassium 2.9 (L) 3.5 - 5.2 mmol/L   Chloride 99 96 - 106 mmol/L   CO2 26 20 - 29 mmol/L   Calcium 9.5 8.7 - 10.2 mg/dL  Aldosterone + renin activity w/ ratio     Status: Abnormal   Collection Time: 11/10/22 12:31 PM  Result Value Ref Range   Aldosterone 2.2 0.0 - 30.0 ng/dL   Renin Activity, Plasma 19.262 (H) 0.167 - 5.380  ng/mL/hr   Aldos/Renin Ratio 0.1 0.0 - 30.0    Comment:                          Units:      ng/dL per ng/mL/hr  Potassium     Status: Abnormal   Collection Time: 11/10/22 12:31 PM  Result Value Ref Range   Potassium 3.3 (L) 3.5 - 5.2 mmol/L  Basic Metabolic Panel     Status: None   Collection Time: 11/22/22  4:33 PM  Result Value Ref Range   Glucose 91 70 - 99 mg/dL   BUN 7 6 - 24 mg/dL   Creatinine, Ser  0.70 0.57 - 1.00 mg/dL   eGFR 107 >59 mL/min/1.73   BUN/Creatinine Ratio 10 9 - 23   Sodium 141 134 - 144 mmol/L   Potassium 3.8 3.5 - 5.2 mmol/L   Chloride 100 96 - 106 mmol/L   CO2 25 20 - 29 mmol/L   Calcium 10.1 8.7 - 10.2 mg/dL  CBC with Differential/Platelet     Status: Abnormal   Collection Time: 11/22/22  4:33 PM  Result Value Ref Range   WBC 10.1 3.4 - 10.8 x10E3/uL   RBC 4.36 3.77 - 5.28 x10E6/uL   Hemoglobin 12.9 11.1 - 15.9 g/dL   Hematocrit 38.6 34.0 - 46.6 %   MCV 89 79 - 97 fL   MCH 29.6 26.6 - 33.0 pg   MCHC 33.4 31.5 - 35.7 g/dL   RDW 13.6 11.7 - 15.4 %   Platelets 318 150 - 450 x10E3/uL   Neutrophils 55 Not Estab. %   Lymphs 39 Not Estab. %   Monocytes 5 Not Estab. %   Eos 1 Not Estab. %   Basos 0 Not Estab. %   Neutrophils Absolute 5.5 1.4 - 7.0 x10E3/uL   Lymphocytes Absolute 3.9 (H) 0.7 - 3.1 x10E3/uL   Monocytes Absolute 0.5 0.1 - 0.9 x10E3/uL   EOS (ABSOLUTE) 0.1 0.0 - 0.4 x10E3/uL   Basophils Absolute 0.0 0.0 - 0.2 x10E3/uL   Immature Granulocytes 0 Not Estab. %   Immature Grans (Abs) 0.0 0.0 - 0.1 x10E3/uL     Psychiatric Specialty Exam: Physical Exam  Review of Systems  Musculoskeletal:  Positive for joint swelling.    Weight 201 lb (91.2 kg).There is no height or weight on file to calculate BMI.  General Appearance: NA  Eye Contact:  NA  Speech:  Slow  Volume:  Normal  Mood:  Dysphoric and Hopeless  Affect:  Negative  Thought Process:  Descriptions of Associations: Intact  Orientation:  Full (Time, Place, and Person)  Thought Content:  Rumination  Suicidal Thoughts:  No  Homicidal Thoughts:  No  Memory:  Immediate;   Good Recent;   Good Remote;   Good  Judgement:  Intact  Insight:  Present  Psychomotor Activity:  NA  Concentration:  Concentration: Good and Attention Span: Good  Recall:  Good  Fund of Knowledge:  Good  Language:  Good  Akathisia:  No  Handed:  Right  AIMS (if indicated):     Assets:  Communication  Skills Desire for Improvement Housing Social Support Talents/Skills Transportation  ADL's:  Intact  Cognition:  WNL  Sleep:  ok     Assessment/Plan: Bipolar 1 disorder  GAD (generalized anxiety disorder)  I reviewed blood work results.  Her potassium is better now since taking the potassium.  Patient has no issue with the current medication.  Continue olanzapine 15 mg at bedtime, Prozac 30 mg daily and Ativan 1 mg up to 3 times a day.  Discussed medication side effects and benefits.  Patient has multiple doctor's appointment for her multiple health needs.  Recommended to call us back if she has any question or any concern.  Follow-up in 3 months.   Follow Up Instructions:     I discussed the assessment and treatment plan with the patient. The patient was provided an opportunity to ask questions and all were answered. The patient agreed with the plan and demonstrated an understanding of the instructions.   The patient was advised to call back or seek an in-person evaluation if the symptoms worsen or if the condition fails to improve as anticipated.    Collaboration of Care: Other provider involved in patient's care AEB notes are available in epic to review.  Patient/Guardian was advised Release of Information must be obtained prior to any record release in order to collaborate their care with an outside provider. Patient/Guardian was advised if they have not already done so to contact the registration department to sign all necessary forms in order for [place] to release information regarding their care.   Consent: Patient/Guardian gives verbal consent for treatment and assignment of benefits for services provided during this visit. Patient/Guardian expressed understanding and agreed to proceed.     I provided 16 minutes of non face to face time during this encounter.  Kathlee Nations, MD 12/08/2022

## 2022-12-08 NOTE — Progress Notes (Signed)
Patient ID: Sandra Miller, female    DOB: Jan 19, 1976  MRN: [id number]  CC: Hypertension (BP med change. [employer], requesting FMLA forms due to missing work. )   Subjective: Sandra Miller is a 47 y.o. female who presents for chronic ds management Her concerns today include:  Patient with history of HTN, HL, prediabetes, obesity, tob dep, RLS, DVT of the RT IJV and surrounding IJ port 04/2020 GERD, hidradenitis suppurativa, breast CA left dx 02/2020 (ERP s/p left mastectomy, neoadjuvant chemotherapy and adjuvant XRT; Dr. Lindi Adie).  Vulvar cancer (dx 01/2020), HSV infection, genitial warts, bipolar disorder/dep/anx (Dr. [name] Schilder)    HTN/hypokalemia: Patient is on lisinopril/hydrochlorothiazide 10/12.5 mg daily.  She has been having issues with persistent hypokalemia and was placed on potassium supplement 60 mEq daily. Aldosterone renin ratio was normal.  HL: Reports compliance with Crestor.  Tobacco dependence: Placed on nicotine patches and gum on last visit. Still smoking.  Feels smoking comforts her.  Very depressed and anxious.  Hx Vulvar CA:  followed by Dr.Tucker.  Missed her last 2 appts with her Chronically feels fatigue.  Just getting over being sick from gastroenteritis when she saw Dr. Margarita Rana last mth Feels sick and over well all the time every since cancer dx.   Followed by Dr. [name] Schilder.  Feels meds not helping for her GAD/MDD.  Also hx of Bipolar.  He has tried changing meds; pt fear S.E of meds.  She has not established with a counselor as yet.  She states that Dr. Marguerite Olea office is trying to hire a counselor and the same thing at the oncology center. Has FMLA form for me to complete.  States that she never missed work unless she is sick.  She is wanting intermittent leave a few times a month  Graves: On Tapazole and Propanolol PRN.  Last seen by endocrinologist a few mths ago, TSH was good.  Patient Active Problem List   Diagnosis Date Noted   Migraine with aura and  without status migrainosus, not intractable 10/03/2022   Pain in right knee 09/07/2022   Bilateral primary osteoarthritis of knee 09/07/2022   Chronic migraine without aura without status migrainosus, not intractable 07/07/2022   Sebaceous cyst 12/06/2021   Pain in left knee 11/03/2021   Hyperthyroidism 08/13/2021   Bipolar 1 disorder 05/07/2021   Obesity (BMI 30.0-34.9) 05/07/2021   Hyperlipidemia 08/12/2020   Prediabetes 08/12/2020   Hypomagnesemia 08/12/2020   Mutation in BRIP1 gene 06/24/2020   Hypokalemia 06/17/2020   Dehydration    Colitis 06/16/2020   Port-A-Cath in place 03/11/2020   Malignant neoplasm of upper-inner quadrant of left breast in female, estrogen receptor positive 02/25/2020   Vulvar cancer 01/29/2020   Vulvar ulceration 01/23/2020   Essential hypertension 10/31/2019   Nausea and vomiting 12/27/2018   Seasonal allergic rhinitis 12/27/2018   History of ELISA positive for HSV 10/01/2018   Restless leg syndrome 04/01/2016   Nephrolithiasis 03/08/2016   Obstructive pyelonephritis 03/08/2016   Normocytic anemia 03/08/2016   Hidradenitis suppurativa of left axilla 11/03/2015   Loss of weight 09/08/2015   Neck pain on left side 10/22/2014   Genital warts 05/20/2014   GERD (gastroesophageal reflux disease) 12/26/2012   Functional dyspepsia 12/26/2012   Tobacco abuse 08/22/2012   Herpes simplex virus (HSV) infection 02/03/2007     Current Outpatient Medications on File Prior to Visit  Medication Sig Dispense Refill   acetaminophen (TYLENOL) 500 MG tablet Take 1 tablet (500 mg total) by mouth every 6 (six)  hours as needed. 30 tablet 0   diclofenac Sodium (VOLTAREN) 1 % GEL Apply 4 grams topically 4  times daily as needed. 500 g 6   FLUoxetine (PROZAC) 10 MG capsule Take 3 capsules (30 mg total) by mouth daily. 90 capsule 2   Fremanezumab-vfrm (AJOVY) 225 MG/1.5ML SOAJ Inject 225 mg into the skin every 30 (thirty) days. 1.5 mL 8   gabapentin (NEURONTIN) 300 MG  capsule Take 1 capsule (300 mg total) by mouth daily. Pt needs office visit. 30 capsule 5   letrozole (FEMARA) 2.5 MG tablet Take 1 tablet by mouth daily. 90 tablet 3   letrozole (FEMARA) 2.5 MG tablet Take 1 tablet (2.5 mg total) by mouth daily. 90 tablet 3   lisinopril-hydrochlorothiazide (ZESTORETIC) 10-12.5 MG tablet Take 1 tablet by mouth daily. 90 tablet 3   loratadine (CLARITIN) 10 MG tablet Take 10 mg by mouth daily.     LORazepam (ATIVAN) 1 MG tablet Take 1 tablet (1 mg total) by mouth 3 (three) times daily as needed for anxiety 90 tablet 1   methimazole (TAPAZOLE) 5 MG tablet Take one tablet (5 mg dose) by mouth daily. 90 tablet 3   Multiple Vitamin (MULTIVITAMIN WITH MINERALS) TABS tablet Take 1 tablet by mouth daily.     OLANZapine (ZYPREXA) 15 MG tablet Take 1 tablet (15 mg total) by mouth at bedtime. 30 tablet 2   omeprazole (PRILOSEC) 40 MG capsule Take 1 capsule (40 mg total) by mouth in the morning and at bedtime. 60 capsule 5   ondansetron (ZOFRAN-ODT) 8 MG disintegrating tablet Dissolve 1 tablet (8 mg total) by mouth every 8 (eight) hours as needed for nausea or vomiting. 20 tablet 0   potassium chloride SA (KLOR-CON M) 20 MEQ tablet Take 3 tablets (60 mEq total) by mouth daily. 90 tablet 3   promethazine (PHENERGAN) 25 MG tablet Take 1 tablet (25 mg total) by mouth every 8 (eight) hours as needed for nausea or vomiting. 20 tablet 0   propranolol (INDERAL) 10 MG tablet Take 1 tablet by mouth 2 (two) times daily. (Patient taking differently: Take 10 mg by mouth as needed.) 180 tablet 1   rosuvastatin (CRESTOR) 10 MG tablet Take 1 tablet (10 mg total) by mouth daily. 90 tablet 3   senna-docusate (SENOKOT-S) 8.6-50 MG tablet Take 2 tablets by mouth at bedtime. For AFTER surgery only, do not take if having diarrhea 30 tablet 0   Ubrogepant (UBRELVY) 100 MG TABS Take 1 tablet (100 mg total) by mouth every 2 (two) hours as needed. Maximum 200mg  a day. 16 tablet 11   [DISCONTINUED]  prochlorperazine (COMPAZINE) 10 MG tablet TAKE 1 TABLET BY MOUTH EVERY 6 HOURS AS NEEDED FOR NAUSEA OR VOMITING (Patient taking differently: Take 10 mg by mouth every 6 (six) hours as needed for nausea or vomiting.) 30 tablet 1   No current facility-administered medications on file prior to visit.    Allergies  Allergen Reactions   Dilaudid [Hydromorphone Hcl] Other (See Comments)    Pt became confused, pulled out iv's, does not remember anything   Aspirin Hives    States able to tolerate Goody Powders and Ibuprofen without any problem    Depakote [Divalproex Sodium] Nausea And Vomiting   Minocycline Hives    Social History   Socioeconomic History   Marital status: Single    Spouse name: Not on file   Number of children: Not on file   Years of education: Not on file   Highest education  level: Not on file  Occupational History   Not on file  Tobacco Use   Smoking status: Every Day    Packs/day: 0.50    Years: 22.00    Additional pack years: 0.00    Total pack years: 11.00    Types: Cigarettes   Smokeless tobacco: Never   Tobacco comments:    Recently started a smoking cessation class.   Vaping Use   Vaping Use: Never used  Substance and Sexual Activity   Alcohol use: Not Currently   Drug use: Yes    Types: Marijuana   Sexual activity: Yes    Partners: Male    Birth control/protection: Surgical  Other Topics Concern   Not on file  Social History Narrative   Former [facility] patient.      Was on disability at one point.   Return to the workforce.  40 hours a week at [employer], 10 hours a week on the weekends at [employer] in [place] at night 10 to 12 hours a week.      Has grown children, she lives alone with a pet, continues to smoke no alcohol or drug use at this time      History of EtOH abuse and THC use.         Social Determinants of Health   Financial Resource Strain: Low Risk  (07/01/2021)   Overall Financial Resource Strain  (CARDIA)    Difficulty of Paying Living Expenses: Not hard at all  Food Insecurity: No Food Insecurity (07/01/2021)   Hunger Vital Sign    Worried About Running Out of Food in the Last Year: Never true    Ran Out of Food in the Last Year: Never true  Transportation Needs: No Transportation Needs (07/01/2021)   PRAPARE - Hydrologist (Medical): No    Lack of Transportation (Non-Medical): No  Physical Activity: Inactive (07/01/2021)   Exercise Vital Sign    Days of Exercise per Week: 0 days    Minutes of Exercise per Session: 0 min  Stress: Stress Concern Present (07/01/2021)   [place]ony    Feeling of Stress : Rather much  Social Connections: Socially Isolated (07/01/2021)   Social Connection and Isolation Panel [NHANES]    Frequency of Communication with Friends and Family: More than three times a week    Frequency of Social Gatherings with Friends and Family: More than three times a week    Attends Religious Services: Never    Marine scientist or Organizations: No    Attends Archivist Meetings: Never    Marital Status: Never married  Intimate Partner Violence: Not At Risk (07/01/2021)   Humiliation, Afraid, Rape, and Kick questionnaire    Fear of Current or Ex-Partner: No    Emotionally Abused: No    Physically Abused: No    Sexually Abused: No    Family History  Problem Relation Age of Onset   Heart disease Father    Lung cancer Father        d. 70   Alcohol abuse Father    Heart disease Mother    Depression Mother    Anxiety disorder Mother    Drug abuse Brother    Alcohol abuse Brother    Drug abuse Brother    ADD / ADHD Brother    Colon polyps Brother    Cancer Paternal Grandfather        "  stomach"   Diabetes Maternal Grandfather    Diabetes Paternal Grandmother    Kidney disease Maternal Uncle    Cirrhosis Cousin        alcoholic   Anxiety disorder  Maternal Aunt    Depression Maternal Aunt    Cancer Cousin        maternal; ovarian cancer or other "female" cancer?   Lung cancer Paternal Uncle 72   Throat cancer Cousin        paternal; dx 53s   Lung cancer Cousin        paternal; dx 73s    Past Surgical History:  Procedure Laterality Date   CESAREAN SECTION  1995   w/  Bilateral Tubal Ligation   COLONOSCOPY  last one 08-09-2013   CYSTOSCOPY W/ URETERAL STENT PLACEMENT Left 03/29/2016   Procedure: CYSTOSCOPY WITH STENT REPLACEMENT;  Surgeon: Nickie Retort, MD;  Location[facility];  Service: Urology;  Laterality: Left;   CYSTOSCOPY WITH RETROGRADE PYELOGRAM, URETEROSCOPY AND STENT PLACEMENT Left 03/08/2016   Procedure: CYSTOSCOPY WITH  LEFT RETROGRADE PYELOGRAM, AND STENT PLACEMENT;  Surgeon: Nickie Retort, MD;  Location: [facility];  Service: Urology;  Laterality: Left;   CYSTOSCOPY/RETROGRADE/URETEROSCOPY/STONE EXTRACTION WITH BASKET Left 03/29/2016   Procedure: CYSTOSCOPY/RETROGRADE/URETEROSCOPY/STONE EXTRACTION WITH BASKET;  Surgeon: Nickie Retort, MD;  [facility];  Service: Urology;  Laterality: Left;   ENDOMETRIAL ABLATION W/ NOVASURE  04-01-2010   ESOPHAGOGASTRODUODENOSCOPY  last one 08-09-2013   KNEE ARTHROSCOPY Left as teen   LASER ABLATION OF THE CERVIX  2012 approx   MASTECTOMY WITH AXILLARY LYMPH NODE DISSECTION Left 07/27/2020   Procedure: LEFT MASTECTOMY WITH LEFT RADIOACTIVE SEED GUIDED TARGETED AXILLARY LYMPH NODE DISSECTION;  Surgeon: Alphonsa Overall, MD;  Location: [facility];  Service: General;  Laterality: Left;   PORT-A-CATH REMOVAL Right 05/18/2021   Procedure: REMOVAL PORT-A-CATH;  Surgeon: Donnie Mesa, MD;  Location: [facility];  Service: General;  Laterality: Right;   PORTACATH PLACEMENT Right 03/06/2020   Procedure: INSERTION PORT-A-CATH WITH ULTRASOUND GUIDANCE;  Surgeon: Alphonsa Overall, MD;  Location: [facility];  Service: General;  Laterality: Right;    ROBOTIC ASSISTED TOTAL HYSTERECTOMY WITH BILATERAL SALPINGO OOPHERECTOMY N/A 05/18/2021   Procedure: XI ROBOTIC ASSISTED TOTAL HYSTERECTOMY WITH BILATERAL SALPINGO OOPHORECTOMY;  Surgeon: Everitt Amber, MD;  Location: [facility];  Service: Gynecology;  Laterality: N/A;   TRANSTHORACIC ECHOCARDIOGRAM  05-19-2006   lvsf normal, ef 55-65%, there was mild flattening of the interventricular septum during diastoli/  RV size at upper limits normal   TUBAL LIGATION     VULVA /PERINEUM BIOPSY N/A 05/18/2021   Procedure: VULVAR BIOPSY;  Surgeon: Everitt Amber, MD;  Location: [facility];  Service: Gynecology;  Laterality: N/A;   WISDOM TOOTH EXTRACTION  age 31 's    ROS: Review of Systems Negative except as stated above  PHYSICAL EXAM: BP 134/86 (BP Location: Left Arm, Patient Position: Sitting, Cuff Size: Normal)   Pulse (!) 101   Temp 99 F (37.2 C) (Oral)   Ht 5\' 5"  (1.651 m)   Wt 199 lb (90.3 kg)   SpO2 99%   BMI 33.12 kg/m   Physical Exam BP 140/90 General appearance - alert, well appearing, middle-aged African-American female and in no distress Mental status -patient is tearful at times Neck - supple, no significant adenopathy Chest - clear to auscultation, no wheezes, rales or rhonchi, symmetric air entry Heart - normal rate, regular rhythm, normal S1, S2, no murmurs, rubs, clicks or  gallops Extremities - peripheral pulses normal, no pedal edema, no clubbing or cyanosis      Latest Ref Rng & Units 11/22/2022    4:33 PM 11/10/2022   12:31 PM 10/24/2022    3:28 PM  CMP  Glucose 70 - 99 mg/dL 91   104   BUN 6 - 24 mg/dL 7   8   Creatinine 0.57 - 1.00 mg/dL 0.70   0.68   Sodium 134 - 144 mmol/L 141   141   Potassium 3.5 - 5.2 mmol/L 3.8  3.3  2.9   Chloride 96 - 106 mmol/L 100   99   CO2 20 - 29 mmol/L 25   26   Calcium 8.7 - 10.2 mg/dL 10.1   9.5    Lipid Panel     Component Value Date/Time   CHOL 159 08/18/2022 1605   TRIG 120 08/18/2022 1605   HDL 51 08/18/2022 1605   CHOLHDL 3.1  08/18/2022 1605   CHOLHDL 4.7 Ratio 06/13/2008 2303   VLDL 32 06/13/2008 2303   LDLCALC 87 08/18/2022 1605    CBC    Component Value Date/Time   WBC 10.1 11/22/2022 1633   WBC 9.6 10/12/2022 1838   RBC 4.36 11/22/2022 1633   RBC 4.12 10/12/2022 1838   HGB 12.9 11/22/2022 1633   HCT 38.6 11/22/2022 1633   PLT 318 11/22/2022 1633   MCV 89 11/22/2022 1633   MCH 29.6 11/22/2022 1633   MCH 29.4 10/12/2022 1838   MCHC 33.4 11/22/2022 1633   MCHC 33.2 10/12/2022 1838   RDW 13.6 11/22/2022 1633   LYMPHSABS 3.9 (H) 11/22/2022 1633   MONOABS 0.5 10/12/2022 1838   EOSABS 0.1 11/22/2022 1633   BASOSABS 0.0 11/22/2022 1633    ASSESSMENT AND PLAN: 1. Essential hypertension Not at goal. Given the hypokalemia, I recommend stopping lisinopril/HCTZ.  We will change to lisinopril 10 mg daily and Norvasc 5 mg daily instead.  Follow-up with clinical pharmacist in 1 month for recheck of blood pressure. Stop potassium supplement. Return to the lab in 1 week for repeat chemistry check. - lisinopril (ZESTRIL) 10 MG tablet; Take 1 tablet (10 mg total) by mouth daily. Stop the Lisinopril/HCTZ combination  Dispense: 90 tablet; Refill: 1 - amLODipine (NORVASC) 5 MG tablet; Take 1 tablet (5 mg total) by mouth daily.  Dispense: 90 tablet; Refill: 1  2. Hypokalemia See #1 above - Basic Metabolic Panel; Future  3. Mixed hyperlipidemia Continue rosuvastatin 10 mg daily.  4. Bipolar 1 disorder Plugged in with behavioral health.  5. Tobacco dependence Strongly encouraged to quit smoking.  6. GAD (generalized anxiety disorder) 7. Moderate major depression She is followed by psychiatry.  I have given her printed list of behavioral health resources in the [place] area where she can call and get plugged in with a therapist.    Patient was given the opportunity to ask questions.  Patient verbalized understanding of the plan and was able to repeat key elements of the plan.   This documentation was  completed using Radio producer.  Any transcriptional errors are unintentional.  No orders of the defined types were placed in this encounter.    Requested Prescriptions    No prescriptions requested or ordered in this encounter    No follow-ups on file.  Karle Plumber, MD, FACP

## 2022-12-09 ENCOUNTER — Other Ambulatory Visit (HOSPITAL_COMMUNITY): Payer: Self-pay

## 2022-12-09 ENCOUNTER — Other Ambulatory Visit: Payer: Self-pay

## 2022-12-10 ENCOUNTER — Other Ambulatory Visit (HOSPITAL_COMMUNITY): Payer: Self-pay

## 2022-12-12 ENCOUNTER — Other Ambulatory Visit (HOSPITAL_COMMUNITY): Payer: Self-pay

## 2022-12-12 NOTE — Telephone Encounter (Signed)
Received fax that approval given for ajovy 12-09-2022 thry 03-10-2023.  Pt informed.  EOC # [id number].

## 2022-12-18 ENCOUNTER — Other Ambulatory Visit (HOSPITAL_COMMUNITY): Payer: Self-pay

## 2022-12-19 ENCOUNTER — Other Ambulatory Visit: Payer: Self-pay

## 2022-12-20 ENCOUNTER — Other Ambulatory Visit (HOSPITAL_COMMUNITY): Payer: Self-pay

## 2022-12-22 ENCOUNTER — Ambulatory Visit: Payer: PRIVATE HEALTH INSURANCE | Admitting: Internal Medicine

## 2022-12-22 ENCOUNTER — Other Ambulatory Visit (HOSPITAL_COMMUNITY): Payer: Self-pay

## 2022-12-22 ENCOUNTER — Ambulatory Visit: Payer: No Typology Code available for payment source | Attending: Internal Medicine

## 2022-12-22 DIAGNOSIS — E876 Hypokalemia: Secondary | ICD-10-CM

## 2022-12-23 ENCOUNTER — Other Ambulatory Visit (HOSPITAL_COMMUNITY): Payer: Self-pay

## 2022-12-23 ENCOUNTER — Telehealth: Payer: Self-pay | Admitting: Internal Medicine

## 2022-12-23 DIAGNOSIS — E876 Hypokalemia: Secondary | ICD-10-CM

## 2022-12-23 LAB — BASIC METABOLIC PANEL
BUN/Creatinine Ratio: 12 (ref 9–23)
BUN: 7 mg/dL (ref 6–24)
CO2: 25 mmol/L (ref 20–29)
Calcium: 10 mg/dL (ref 8.7–10.2)
Chloride: 105 mmol/L (ref 96–106)
Creatinine, Ser: 0.58 mg/dL (ref 0.57–1.00)
Glucose: 102 mg/dL — ABNORMAL HIGH (ref 70–99)
Potassium: 3 mmol/L — ABNORMAL LOW (ref 3.5–5.2)
Sodium: 148 mmol/L — ABNORMAL HIGH (ref 134–144)
eGFR: 111 mL/min/{1.73_m2} (ref >59)

## 2022-12-23 MED ORDER — POTASSIUM CHLORIDE CRYS ER 20 MEQ PO TBCR
20.0000 meq | EXTENDED_RELEASE_TABLET | Freq: Every day | ORAL | 1 refills | Status: DC
  Filled 2022-12-23 – 2022-12-30 (×2): qty 30, 30d supply, fill #0
  Filled 2023-02-15: qty 30, 30d supply, fill #1
  Filled 2023-03-19: qty 30, 30d supply, fill #2
  Filled 2023-04-17: qty 30, 30d supply, fill #3
  Filled 2023-05-25: qty 30, 30d supply, fill #4
  Filled 2023-06-26: qty 30, 30d supply, fill #5

## 2022-12-23 NOTE — Telephone Encounter (Signed)
Called to advised per pcp of the following "Just wanted to confirm that you have stopped the Lisinopril-Hydrochlorothiazide and now taking Lisinopril 10 mg and Amlodipine 5 mg daily.  Your sodium level is elevated.  Please make sure you are drinking 4-8 glasses of water daily.  Your potassium level is low despite stopping the Lisinopril-Hydrochlorothiazide.  Please restart Potassium supplement 20 meq daily and return to the lab in 1 week for recheck.  Prescription sent to your pharmacy. " Unable to reach patient . Message left on VM. Patient advised to call back at [phone] for any question.

## 2022-12-23 NOTE — Telephone Encounter (Signed)
PC placed to pt today to go over lab results.  LVMM informing of who I am and reason for calling.  Sent pt Mychart message.

## 2022-12-29 ENCOUNTER — Ambulatory Visit: Payer: No Typology Code available for payment source | Attending: Family Medicine

## 2022-12-29 DIAGNOSIS — E876 Hypokalemia: Secondary | ICD-10-CM

## 2022-12-30 ENCOUNTER — Other Ambulatory Visit: Payer: Self-pay | Admitting: Internal Medicine

## 2022-12-30 ENCOUNTER — Other Ambulatory Visit (HOSPITAL_COMMUNITY): Payer: Self-pay

## 2022-12-30 ENCOUNTER — Ambulatory Visit: Payer: PRIVATE HEALTH INSURANCE | Admitting: Internal Medicine

## 2022-12-30 DIAGNOSIS — E876 Hypokalemia: Secondary | ICD-10-CM

## 2022-12-30 LAB — BASIC METABOLIC PANEL
BUN/Creatinine Ratio: 8 — ABNORMAL LOW (ref 9–23)
BUN: 5 mg/dL — ABNORMAL LOW (ref 6–24)
CO2: 22 mmol/L (ref 20–29)
Calcium: 10.2 mg/dL (ref 8.7–10.2)
Chloride: 102 mmol/L (ref 96–106)
Creatinine, Ser: 0.66 mg/dL (ref 0.57–1.00)
Glucose: 79 mg/dL (ref 70–99)
Potassium: 3.4 mmol/L — ABNORMAL LOW (ref 3.5–5.2)
Sodium: 144 mmol/L (ref 134–144)
eGFR: 107 mL/min/{1.73_m2} (ref >59)

## 2022-12-30 MED ORDER — POTASSIUM CHLORIDE CRYS ER 10 MEQ PO TBCR
10.0000 meq | EXTENDED_RELEASE_TABLET | Freq: Every day | ORAL | 1 refills | Status: DC
  Filled 2022-12-30: qty 30, 30d supply, fill #0
  Filled 2023-01-30 – 2023-02-15 (×2): qty 30, 30d supply, fill #1

## 2023-01-05 ENCOUNTER — Other Ambulatory Visit (HOSPITAL_COMMUNITY): Payer: Self-pay

## 2023-01-11 ENCOUNTER — Other Ambulatory Visit: Payer: Self-pay | Admitting: Hematology and Oncology

## 2023-01-12 ENCOUNTER — Ambulatory Visit: Payer: Self-pay | Admitting: Pharmacist

## 2023-01-19 ENCOUNTER — Other Ambulatory Visit (HOSPITAL_COMMUNITY): Payer: Self-pay

## 2023-01-20 ENCOUNTER — Other Ambulatory Visit (HOSPITAL_COMMUNITY): Payer: Self-pay

## 2023-01-23 ENCOUNTER — Other Ambulatory Visit: Payer: Self-pay | Admitting: Family Medicine

## 2023-01-23 ENCOUNTER — Other Ambulatory Visit (HOSPITAL_COMMUNITY): Payer: Self-pay

## 2023-01-23 DIAGNOSIS — K529 Noninfective gastroenteritis and colitis, unspecified: Secondary | ICD-10-CM

## 2023-01-24 ENCOUNTER — Other Ambulatory Visit (HOSPITAL_COMMUNITY): Payer: Self-pay

## 2023-01-24 ENCOUNTER — Encounter: Payer: Self-pay | Admitting: Internal Medicine

## 2023-01-24 MED ORDER — PROMETHAZINE HCL 25 MG PO TABS
25.0000 mg | ORAL_TABLET | Freq: Three times a day (TID) | ORAL | 0 refills | Status: DC | PRN
  Filled ????-??-??: fill #0

## 2023-01-24 NOTE — Telephone Encounter (Signed)
Requested medication (s) are due for refill today - yes  Requested medication (s) are on the active medication list -yes  Future visit scheduled -yes  Last refill: 11/22/22 #20  Notes to clinic: non delegated Rx  Requested Prescriptions  Pending Prescriptions Disp Refills   promethazine (PHENERGAN) 25 MG tablet 20 tablet 0    Sig: Take 1 tablet (25 mg total) by mouth every 8 (eight) hours as needed for nausea or vomiting.     Not Delegated - Gastroenterology: Antiemetics Failed - 01/23/2023  5:29 PM      Failed - This refill cannot be delegated      Passed - Valid encounter within last 6 months    Recent Outpatient Visits           1 month ago Essential hypertension   [place] [facility] Marcine Matar, MD   2 months ago Gastroenteritis   [facility] Hoy Register, MD   5 months ago Annual physical exam   [facility] Marcine Matar, MD   1 year ago Moderate major depression [facility])   [facility] Marcine Matar, MD   1 year ago Essential hypertension   [facility] Marcine Matar, MD       Future Appointments             In 2 months Marcine Matar, MD [facility]   In 8 months [place], Aundra Millet, NP [facility]               Requested Prescriptions  Pending Prescriptions Disp Refills   promethazine (PHENERGAN) 25 MG tablet 20 tablet 0    Sig: Take 1 tablet (25 mg total) by mouth every 8 (eight) hours as needed for nausea or vomiting.     Not Delegated - Gastroenterology: Antiemetics Failed - 01/23/2023  5:29 PM      Failed - This refill cannot be delegated      Passed - Valid encounter within last 6 months    Recent Outpatient Visits           1 month ago Essential hypertension   [place] [facility] Marcine Matar, MD   2 months ago Gastroenteritis   [place] [facility] Hoy Register, MD   5 months ago Annual physical exam   [facility] Marcine Matar, MD   1 year ago Moderate major depression [facility])   [place] [facility] Marcine Matar, MD   1 year ago Essential hypertension   [place] [facility] Marcine Matar, MD       Future Appointments             In 2 months Laural Benes, Binnie Rail, MD [facility]   In 8 months Butch Penny, NP [facility]

## 2023-01-25 ENCOUNTER — Other Ambulatory Visit: Payer: Self-pay

## 2023-01-25 ENCOUNTER — Other Ambulatory Visit (HOSPITAL_COMMUNITY): Payer: Self-pay

## 2023-01-25 ENCOUNTER — Emergency Department (HOSPITAL_COMMUNITY)
Admission: EM | Admit: 2023-01-25 | Discharge: 2023-01-25 | Disposition: A | Discharge status: Home / Self Care | Payer: No Typology Code available for payment source | Attending: Emergency Medicine | Admitting: Emergency Medicine

## 2023-01-25 ENCOUNTER — Encounter (HOSPITAL_COMMUNITY): Payer: Self-pay

## 2023-01-25 DIAGNOSIS — K529 Noninfective gastroenteritis and colitis, unspecified: Secondary | ICD-10-CM

## 2023-01-25 DIAGNOSIS — Z79899 Other long term (current) drug therapy: Secondary | ICD-10-CM | POA: Diagnosis not present

## 2023-01-25 DIAGNOSIS — R112 Nausea with vomiting, unspecified: Secondary | ICD-10-CM | POA: Diagnosis present

## 2023-01-25 DIAGNOSIS — R1110 Vomiting, unspecified: Secondary | ICD-10-CM

## 2023-01-25 LAB — COMPREHENSIVE METABOLIC PANEL
ALT: 15 U/L (ref 0–44)
AST: 33 U/L (ref 15–41)
Albumin: 4 g/dL (ref 3.5–5.0)
Alkaline Phosphatase: 44 U/L (ref 38–126)
Anion gap: 11 (ref 5–15)
BUN: <5 mg/dL — ABNORMAL LOW (ref 6–20)
CO2: 25 mmol/L (ref 22–32)
Calcium: 9 mg/dL (ref 8.9–10.3)
Chloride: 105 mmol/L (ref 98–111)
Creatinine, Ser: 0.63 mg/dL (ref 0.44–1.00)
GFR, Estimated: >60 mL/min (ref >60)
Glucose, Bld: 106 mg/dL — ABNORMAL HIGH (ref 70–99)
Potassium: 3.2 mmol/L — ABNORMAL LOW (ref 3.5–5.1)
Sodium: 141 mmol/L (ref 135–145)
Total Bilirubin: 1.3 mg/dL — ABNORMAL HIGH (ref 0.3–1.2)
Total Protein: 7.4 g/dL (ref 6.5–8.1)

## 2023-01-25 LAB — CBC WITH DIFFERENTIAL/PLATELET
Abs Immature Granulocytes: 0.02 10*3/uL (ref 0.00–0.07)
Basophils Absolute: 0 10*3/uL (ref 0.0–0.1)
Basophils Relative: 0 %
Eosinophils Absolute: 0.1 10*3/uL (ref 0.0–0.5)
Eosinophils Relative: 1 %
HCT: 38.9 % (ref 36.0–46.0)
Hemoglobin: 12.8 g/dL (ref 12.0–15.0)
Immature Granulocytes: 0 %
Lymphocytes Relative: 34 %
Lymphs Abs: 3.2 10*3/uL (ref 0.7–4.0)
MCH: 29.6 pg (ref 26.0–34.0)
MCHC: 32.9 g/dL (ref 30.0–36.0)
MCV: 90 fL (ref 80.0–100.0)
Monocytes Absolute: 0.5 10*3/uL (ref 0.1–1.0)
Monocytes Relative: 5 %
Neutro Abs: 5.8 10*3/uL (ref 1.7–7.7)
Neutrophils Relative %: 60 %
Platelets: 255 10*3/uL (ref 150–400)
RBC: 4.32 MIL/uL (ref 3.87–5.11)
RDW: 13.4 % (ref 11.5–15.5)
WBC: 9.7 10*3/uL (ref 4.0–10.5)
nRBC: 0 % (ref 0.0–0.2)

## 2023-01-25 LAB — LIPASE, BLOOD: Lipase: 32 U/L (ref 11–51)

## 2023-01-25 LAB — TSH: TSH: 1.884 u[IU]/mL (ref 0.350–4.500)

## 2023-01-25 MED ORDER — PROMETHAZINE HCL 25 MG PO TABS
25.0000 mg | ORAL_TABLET | Freq: Three times a day (TID) | ORAL | 0 refills | Status: DC | PRN
Start: 2023-01-25 — End: 2023-03-06
  Filled 2023-01-25 – 2023-02-02 (×2): qty 20, 7d supply, fill #0

## 2023-01-25 MED ORDER — LACTATED RINGERS IV SOLN: INTRAVENOUS | Status: DC

## 2023-01-25 MED ORDER — POTASSIUM CHLORIDE CRYS ER 20 MEQ PO TBCR
40.0000 meq | EXTENDED_RELEASE_TABLET | Freq: Once | ORAL | Status: AC
  Administered 2023-01-25: 40 meq via ORAL
  Filled 2023-01-25: qty 2

## 2023-01-25 MED ORDER — SODIUM CHLORIDE 0.9 % IV SOLN
25.0000 mg | Freq: Four times a day (QID) | INTRAVENOUS | Status: DC | PRN
  Administered 2023-01-25: 25 mg via INTRAVENOUS
  Filled 2023-01-25: qty 25

## 2023-01-25 MED ORDER — POTASSIUM CHLORIDE CRYS ER 20 MEQ PO TBCR
40.0000 meq | EXTENDED_RELEASE_TABLET | Freq: Once | ORAL | Status: DC
  Filled 2023-01-25: qty 2

## 2023-01-25 MED ORDER — LACTATED RINGERS IV BOLUS
1000.0000 mL | Freq: Once | INTRAVENOUS | Status: AC
  Administered 2023-01-25: 1000 mL via INTRAVENOUS

## 2023-01-25 NOTE — ED Notes (Signed)
Pt. Attempted to provide urine sample, pt. Was not able to at this time.

## 2023-01-25 NOTE — ED Provider Notes (Signed)
EMERGENCY DEPARTMENT AT [facility] Provider Note   CSN: [id number] Arrival date & time: 01/25/23  4403     History  Chief Complaint  Patient presents with   Emesis    Elisavet C Gittens is a 47 y.o. female.  This is a 47 year old female presents with 1 month of intermittent nausea and vomiting.  Patient states she has had no fever or chills.  No associated severe abdominal pain.  Patient states symptoms have been worse when she gets home after work.  She works as a Agricultural engineer.  Denies any urinary symptoms.  No associated with food.  Has been using promethazine with temporary relief.  Called her doctor yesterday to schedule follow-up visit.  Does have a history of GI issues in the past and has been seen by gastroenterology.      Home Medications Prior to Admission medications   Medication Sig Start Date End Date Taking? Authorizing Provider  acetaminophen (TYLENOL) 500 MG tablet Take 1 tablet (500 mg total) by mouth every 6 (six) hours as needed. 01/02/22   Carlisle Beers, FNP  amLODipine (NORVASC) 5 MG tablet Take 1 tablet (5 mg total) by mouth daily. 12/08/22   Marcine Matar, MD  diclofenac Sodium (VOLTAREN) 1 % GEL Apply 4 grams topically 4  times daily as needed. 03/30/21   Hilts, Casimiro Needle, MD  FLUoxetine (PROZAC) 10 MG capsule Take 3 capsules (30 mg total) by mouth daily. 12/08/22   Arfeen, Phillips Grout, MD  Fremanezumab-vfrm (AJOVY) 225 MG/1.5ML SOAJ Inject 225 mg into the skin every 30 (thirty) days. 11/03/22   Anson Fret, MD  gabapentin (NEURONTIN) 300 MG capsule Take 1 capsule (300 mg total) by mouth daily. Pt needs office visit. 09/29/22   Meredith Pel, NP  letrozole [facility]) 2.5 MG tablet Take 1 tablet by mouth daily. 12/01/22   Serena Croissant, MD  letrozole [facility]) 2.5 MG tablet Take 1 tablet (2.5 mg total) by mouth daily. 12/01/22   Serena Croissant, MD  lisinopril (ZESTRIL) 10 MG tablet Take 1 tablet (10 mg total) by mouth daily. Stop the  Lisinopril/HCTZ combination 12/08/22   Marcine Matar, MD  loratadine (CLARITIN) 10 MG tablet Take 10 mg by mouth daily.    [provider]  LORazepam (ATIVAN) 1 MG tablet Take 1 tablet (1 mg total) by mouth 3 (three) times daily as needed for anxiety 12/08/22   Arfeen, Phillips Grout, MD  methimazole (TAPAZOLE) 5 MG tablet Take one tablet (5 mg dose) by mouth daily. 09/22/22     Multiple Vitamin (MULTIVITAMIN WITH MINERALS) TABS tablet Take 1 tablet by mouth daily.    [provider]  OLANZapine (ZYPREXA) 15 MG tablet Take 1 tablet (15 mg total) by mouth at bedtime. 12/08/22   Arfeen, Phillips Grout, MD  omeprazole (PRILOSEC) 40 MG capsule Take 1 capsule (40 mg total) by mouth in the morning and at bedtime. 09/29/22   Meredith Pel, NP  ondansetron (ZOFRAN-ODT) 8 MG disintegrating tablet Dissolve 1 tablet (8 mg total) by mouth every 8 (eight) hours as needed for nausea or vomiting. 09/14/22   Wallis Bamberg, PA-C  potassium chloride (KLOR-CON M) 10 MEQ tablet Take 1 tablet (10 mEq) by mouth daily (with a 20 mEq tablet for a total of 30 mEq daily). 12/30/22   Marcine Matar, MD  potassium chloride SA (KLOR-CON M) 20 MEQ tablet Take 1 tablet (20 mEq) by mouth daily. 12/23/22   Marcine Matar, MD  promethazine (PHENERGAN) 25 MG tablet Take 1 tablet (25 mg total) by mouth every 8 (eight) hours as needed for nausea or vomiting. 01/24/23   Marcine Matar, MD  propranolol (INDERAL) 10 MG tablet Take 1 tablet by mouth 2 (two) times daily. Patient taking differently: Take 10 mg by mouth as needed. 12/06/21   Marcine Matar, MD  rosuvastatin (CRESTOR) 10 MG tablet Take 1 tablet (10 mg total) by mouth daily. 08/18/22   Marcine Matar, MD  senna-docusate (SENOKOT-S) 8.6-50 MG tablet Take 2 tablets by mouth at bedtime. For AFTER surgery only, do not take if having diarrhea 02/19/21   Cross, Efraim Kaufmann D, NP  Ubrogepant (UBRELVY) 100 MG TABS Take 1 tablet (100 mg total) by mouth every 2 (two) hours as  needed. Maximum 200mg  a day. 10/03/22   Anson Fret, MD  prochlorperazine (COMPAZINE) 10 MG tablet TAKE 1 TABLET BY MOUTH EVERY 6 HOURS AS NEEDED FOR NAUSEA OR VOMITING Patient taking differently: Take 10 mg by mouth every 6 (six) hours as needed for nausea or vomiting. 06/16/20 08/18/20  Serena Croissant, MD      Allergies    Dilaudid [hydromorphone hcl], Aspirin, Depakote [divalproex sodium], and Minocycline    Review of Systems   Review of Systems  All other systems reviewed and are negative.   Physical Exam Updated Vital Signs BP (!) 164/101 (BP Location: Left Arm)   Pulse 96   Temp 98.9 F (37.2 C) (Oral)   Resp 17   Wt 90 kg   SpO2 100%   BMI 33.02 kg/m  Physical Exam Vitals and nursing note reviewed.  Constitutional:      General: She is not in acute distress.    Appearance: Normal appearance. She is well-developed. She is not toxic-appearing.  HENT:     Head: Normocephalic and atraumatic.  Eyes:     General: Lids are normal.     Conjunctiva/sclera: Conjunctivae normal.     Pupils: Pupils are equal, round, and reactive to light.  Neck:     Thyroid: No thyroid mass.     Trachea: No tracheal deviation.  Cardiovascular:     Rate and Rhythm: Normal rate and regular rhythm.     Heart sounds: Normal heart sounds. No murmur heard.    No gallop.  Pulmonary:     Effort: Pulmonary effort is normal. No respiratory distress.     Breath sounds: Normal breath sounds. No stridor. No decreased breath sounds, wheezing, rhonchi or rales.  Abdominal:     General: There is no distension.     Palpations: Abdomen is soft.     Tenderness: There is no abdominal tenderness. There is no rebound.  Musculoskeletal:        General: No tenderness. Normal range of motion.     Cervical back: Normal range of motion and neck supple.  Skin:    General: Skin is warm and dry.     Findings: No abrasion or rash.  Neurological:     Mental Status: She is alert and oriented to person, place,  and time. Mental status is at baseline.     GCS: GCS eye subscore is 4. GCS verbal subscore is 5. GCS motor subscore is 6.     Cranial Nerves: No cranial nerve deficit.     Sensory: No sensory deficit.     Motor: Motor function is intact.  Psychiatric:        Attention and Perception: Attention normal.  Speech: Speech normal.        Behavior: Behavior normal.    ED Results / Procedures / Treatments   Labs (all labs ordered are listed, but only abnormal results are displayed) Labs Reviewed  CBC WITH DIFFERENTIAL/PLATELET  COMPREHENSIVE METABOLIC PANEL  LIPASE, BLOOD  URINALYSIS, ROUTINE W REFLEX MICROSCOPIC    EKG None  Radiology No results found.  Procedures Procedures    Medications Ordered in ED Medications  lactated ringers infusion (has no administration in time range)  lactated ringers bolus 1,000 mL (has no administration in time range)  promethazine (PHENERGAN) 25 mg in sodium chloride 0.9 % 50 mL IVPB (has no administration in time range)    ED Course/ Medical Decision Making/ A&P                             Medical Decision Making Amount and/or Complexity of Data Reviewed Labs: ordered.  Risk Prescription drug management.   Patient given IV fluids here as well as antiemetics and feels much better.  Thyroid function studies are normal.  She has no evidence of severe dehydration.  Mild hypokalemia noted and treated with oral potassium.  She will take this without any issues.  Will follow-up with her doctor.  No indication for imaging at this time        Final Clinical Impression(s) / ED Diagnoses Final diagnoses:  None    Rx / DC Orders ED Discharge Orders     None         Lorre Nick, MD 01/25/23 1243

## 2023-01-25 NOTE — ED Triage Notes (Signed)
C/o lower abd pain with n/v/chills x1 month  Denies diarrhea.  Zofran 3am w/o relief.

## 2023-01-31 ENCOUNTER — Other Ambulatory Visit: Payer: Self-pay

## 2023-02-02 ENCOUNTER — Other Ambulatory Visit: Payer: Self-pay

## 2023-02-02 ENCOUNTER — Other Ambulatory Visit (HOSPITAL_COMMUNITY): Payer: Self-pay

## 2023-02-02 ENCOUNTER — Ambulatory Visit
Admission: RE | Admit: 2023-02-02 | Discharge: 2023-02-02 | Disposition: A | Discharge status: Home / Self Care | Payer: No Typology Code available for payment source | Source: Ambulatory Visit | Attending: Radiation Oncology | Admitting: Radiation Oncology

## 2023-02-02 VITALS — BP 144/91 | HR 101 | Temp 97.7°F | Resp 20 | Wt 195.0 lb

## 2023-02-02 DIAGNOSIS — Z170 Estrogen receptor positive status [ER+]: Secondary | ICD-10-CM | POA: Diagnosis not present

## 2023-02-02 DIAGNOSIS — C50212 Malignant neoplasm of upper-inner quadrant of left female breast: Secondary | ICD-10-CM | POA: Diagnosis not present

## 2023-02-02 DIAGNOSIS — Z923 Personal history of irradiation: Secondary | ICD-10-CM | POA: Diagnosis not present

## 2023-02-02 DIAGNOSIS — Z79899 Other long term (current) drug therapy: Secondary | ICD-10-CM | POA: Insufficient documentation

## 2023-02-02 DIAGNOSIS — C519 Malignant neoplasm of vulva, unspecified: Secondary | ICD-10-CM

## 2023-02-02 DIAGNOSIS — Z79811 Long term (current) use of aromatase inhibitors: Secondary | ICD-10-CM | POA: Diagnosis not present

## 2023-02-02 NOTE — Progress Notes (Signed)
Sandra Miller is here today for follow up post radiation to the pelvic.  They completed their radiation on: 05/08/20  Does the patient complain of any of the following:  Pain:No Abdominal bloating: No Diarrhea/Constipation: No Nausea/Vomiting: Yes to both x2 months Vaginal Discharge: No Blood in Urine or Stool: No Urinary Issues (dysuria/incomplete emptying/ incontinence/ increased frequency/urgency): No Does patient report using vaginal dilator 2-3 times a week and/or sexually active 2-3 weeks: No Post radiation skin changes: No   Sandra Miller is here today for follow up post radiation to the breast.   Breast Side:left   They completed their radiation on: 10/23/20  Does the patient complain of any of the following: Post radiation skin issues: No Breast Tenderness: Yes Breast Swelling: No Lymphadema: No Range of Motion limitations: No Fatigue post radiation: Yes Appetite good/fair/poor: Poor, patient states that she is eating chicken noodle soup and yogurt.  Additional comments if applicable:   BP (!) 144/91   Pulse (!) 101   Temp 97.7 F (36.5 C)   Resp 20   Wt 195 lb (88.5 kg)   SpO2 99%   BMI 32.45 kg/m

## 2023-02-02 NOTE — Progress Notes (Signed)
Radiation Oncology         [phone]) [phone] ________________________________  Name: Sandra Miller MRN: [id number]  Date: 02/02/2023  DOB: 1976/05/25  Follow-Up Visit Note  CC: Marcine Matar, MD  Serena Croissant, MD    ICD-10-CM   1. Vulvar cancer [facility])  C51.9 Ambulatory referral to Social Work      Diagnosis: Stage IB (ypT2, ypN1a) Multicentric Left Breast, Invasive Ductal Carcinoma with DCIS, ER+ / PR+ / Her2+, Grade 3   Stage IB vulvar cancer s/p definitive radiation (inoperable due to the close proximity to the anal sphincter), s/p diagnosis of HR positive left breast cancer, and deleterious mutation in BRIP1 (increased risk for ovarian cancer).   Interval Since Last Radiation: 2 years, 3 months, and 12 days   2) Radiation Treatment Dates: 09/09/2020 through 10/23/2020 Site: Left chest wall Technique: 3D Total Dose (Gy): 50/50 Dose per Fx (Gy): 2 Completed Fx: 25/25 Beam Energies: 6X, 10X   Site: Left SCV Technique: 3D Total Dose (Gy): 50/50 Dose per Fx (Gy): 2 Completed Fx: 25/25 Beam Energies: 6X, 10X   Site: Left chest wall boost Technique: Electron Total Dose (Gy): 10/10 Dose per Fx (Gy): 2 Completed Fx: 5/5 Beam Energies: 6E   1) Radiation Treatment Dates: 03/16/2020 through 05/08/2020 Site Technique Total Dose (Gy) Dose per Fx (Gy) Completed Fx Beam Energies  Vulva: Pelvis IMRT 45/45 1.8 25/25 6X  Vulva: Pelvis_Bst IMRT 16.2/16.2 1.8 9/9 6X   Narrative:  The patient returns today for routine follow-up. She was last seen here for follow-up on 08/01/22. She has not been able to follow-up with Dr. Pricilla Holm in the interval.  She missed her follow-up appointments with Dr. Pricilla Holm secondary to illness.  Since her last visit, the patient has had several ED encounters related to viral gastroenteritis, flu like symptoms, and nausea with emesis. ED management during each encounter consisted of symptom management for all of the above.      Otherwise, no significant  oncologic interval history since the patient was last seen for follow-up.         Patient endorses nausea/vomiting that has worsened over the past 2 months. She went to the ER a couple days ago and was given phenergan and told to follow-up with GI. She does have a gastroenterologist that she is going to try and schedule an appointment with. She reports some new tenderness to her left chest wall where she is regaining sensation. She denies any breast swelling, lymphedema, or issues with range of motion. She also denies any vaginal discharge, urinary issues, or blood in her urine or stool. She is not using her dilator regularly.   Allergies:  is allergic to dilaudid [hydromorphone hcl], aspirin, depakote [divalproex sodium], and minocycline.  Meds: Current Outpatient Medications  Medication Sig Dispense Refill   acetaminophen (TYLENOL) 500 MG tablet Take 1 tablet (500 mg total) by mouth every 6 (six) hours as needed. 30 tablet 0   amLODipine (NORVASC) 5 MG tablet Take 1 tablet (5 mg total) by mouth daily. 90 tablet 1   diclofenac Sodium (VOLTAREN) 1 % GEL Apply 4 grams topically 4  times daily as needed. 500 g 6   FLUoxetine (PROZAC) 10 MG capsule Take 3 capsules (30 mg total) by mouth daily. 90 capsule 2   Fremanezumab-vfrm (AJOVY) 225 MG/1.5ML SOAJ Inject 225 mg into the skin every 30 (thirty) days. 1.5 mL 8   gabapentin (NEURONTIN) 300 MG capsule Take 1 capsule (300 mg total) by mouth daily. Pt needs  office visit. 30 capsule 5   letrozole (FEMARA) 2.5 MG tablet Take 1 tablet by mouth daily. 90 tablet 3   letrozole (FEMARA) 2.5 MG tablet Take 1 tablet (2.5 mg total) by mouth daily. 90 tablet 3   lisinopril (ZESTRIL) 10 MG tablet Take 1 tablet (10 mg total) by mouth daily. Stop the Lisinopril/HCTZ combination 90 tablet 1   loratadine (CLARITIN) 10 MG tablet Take 10 mg by mouth daily.     LORazepam (ATIVAN) 1 MG tablet Take 1 tablet (1 mg total) by mouth 3 (three) times daily as needed for anxiety  90 tablet 1   methimazole (TAPAZOLE) 5 MG tablet Take one tablet (5 mg dose) by mouth daily. 90 tablet 3   Multiple Vitamin (MULTIVITAMIN WITH MINERALS) TABS tablet Take 1 tablet by mouth daily.     OLANZapine (ZYPREXA) 15 MG tablet Take 1 tablet (15 mg total) by mouth at bedtime. 30 tablet 2   omeprazole (PRILOSEC) 40 MG capsule Take 1 capsule (40 mg total) by mouth in the morning and at bedtime. 60 capsule 5   ondansetron (ZOFRAN-ODT) 8 MG disintegrating tablet Dissolve 1 tablet (8 mg total) by mouth every 8 (eight) hours as needed for nausea or vomiting. 20 tablet 0   potassium chloride (KLOR-CON M) 10 MEQ tablet Take 1 tablet (10 mEq) by mouth daily (with a 20 mEq tablet for a total of 30 mEq daily). 90 tablet 1   potassium chloride SA (KLOR-CON M) 20 MEQ tablet Take 1 tablet (20 mEq) by mouth daily. 90 tablet 1   promethazine (PHENERGAN) 25 MG tablet Take 1 tablet (25 mg total) by mouth every 8 (eight) hours as needed for nausea or vomiting. 20 tablet 0   propranolol (INDERAL) 10 MG tablet Take 1 tablet by mouth 2 (two) times daily. (Patient taking differently: Take 10 mg by mouth as needed.) 180 tablet 1   rosuvastatin (CRESTOR) 10 MG tablet Take 1 tablet (10 mg total) by mouth daily. 90 tablet 3   senna-docusate (SENOKOT-S) 8.6-50 MG tablet Take 2 tablets by mouth at bedtime. For AFTER surgery only, do not take if having diarrhea 30 tablet 0   Ubrogepant (UBRELVY) 100 MG TABS Take 1 tablet (100 mg total) by mouth every 2 (two) hours as needed. Maximum 200mg  a day. 16 tablet 11   No current facility-administered medications for this encounter.    Physical Findings: The patient is in no acute distress. Patient is alert and oriented.  weight is 195 lb (88.5 kg). Her temperature is 97.7 F (36.5 C). Her blood pressure is 144/91 (abnormal) and her pulse is 101 (abnormal). Her respiration is 20 and oxygen saturation is 99%. .  . Lungs are clear to auscultation bilaterally. Heart has regular rate  and rhythm. No palpable cervical, supraclavicular, or axillary adenopathy. Abdomen soft, non-tender, normal bowel sounds.  Right Breast: no palpable mass, nipple discharge or bleeding. Left chest wall: no palpable mass, nipple discharge, or bleeding. No significant skin changes in the radiation field.   The inguinal areas are free of adenopathy. Mild hyperpigmentation changes noted in the external genitalia. No visible lesions or ulcerations.  No palpable nodularity.  A speculum exam is performed. No mucosal lesions noted in the vaginal vault.  Vaginal vault is narrow but does permit index finger.  The cervix is surgically absent. On bimanual and rectovaginal examination no pelvic masses appreciated. Vaginal cuff intact.  Lab Findings: Lab Results  Component Value Date   WBC 9.7 01/25/2023  HGB 12.8 01/25/2023   HCT 38.9 01/25/2023   MCV 90.0 01/25/2023   PLT 255 01/25/2023    Radiographic Findings: No results found.  Impression:  Stage IB (ypT2, ypN1a) Multicentric Left Breast, Invasive Ductal Carcinoma with DCIS, ER+ / PR+ / Her2+, Grade 3   Stage IB vulvar cancer s/p definitive radiation (inoperable due to the close proximity to the anal sphincter), s/p diagnosis of HR positive left breast cancer, and deleterious mutation in BRIP1 (increased risk for ovarian cancer).   No evidence of disease recurrence in patient's breast or vulva.  Recommend she follow-up with gastroenterology concerning her ongoing problems with nausea.    Discussed that we could stretch her appointments to every 6 months but she is anxious and would prefer 78-month follow-up for the time being.  Plan:  Patient will see Dr. Pricilla Holm in 3 months and radiation oncology in 6 months. Patient was educated on importance of dilator use and encouraged to regularly use it. We will get her in touch with social work for assistance in finding a therapist for ongoing anxiety issues.   30 minutes of total time was spent for this  patient encounter, including preparation, face-to-face counseling with the patient and coordination of care, physical exam, and documentation of the encounter. ____________________________________   Joyice Faster, PA-C   Billie Lade, PhD, MD  This document serves as a record of services personally performed by Antony Blackbird, MD. It was created on his behalf by Neena Rhymes, a trained medical scribe. The creation of this record is based on the scribe's personal observations and the provider's statements to them. This document has been checked and approved by the attending provider.

## 2023-02-03 ENCOUNTER — Other Ambulatory Visit (HOSPITAL_COMMUNITY): Payer: Self-pay

## 2023-02-03 ENCOUNTER — Other Ambulatory Visit: Payer: Self-pay | Admitting: Neurology

## 2023-02-03 ENCOUNTER — Telehealth: Payer: Self-pay | Admitting: Licensed Clinical Social Worker

## 2023-02-03 DIAGNOSIS — G43709 Chronic migraine without aura, not intractable, without status migrainosus: Secondary | ICD-10-CM

## 2023-02-03 NOTE — Telephone Encounter (Signed)
[facility] Clinical Social Work  Clinical Social Work was referred by medical provider for counseling resources.  Clinical Social Worker attempted to contact patient by phone to offer support and assess for needs.   No answer. Left VM with direct contact information.     Annison Birchard E Valerye Kobus, LCSW  Clinical Social Worker [facility]

## 2023-02-13 ENCOUNTER — Other Ambulatory Visit (HOSPITAL_COMMUNITY): Payer: Self-pay

## 2023-02-14 ENCOUNTER — Other Ambulatory Visit (HOSPITAL_COMMUNITY): Payer: Self-pay

## 2023-02-15 ENCOUNTER — Other Ambulatory Visit (INDEPENDENT_AMBULATORY_CARE_PROVIDER_SITE_OTHER): Payer: No Typology Code available for payment source

## 2023-02-15 ENCOUNTER — Encounter: Payer: Self-pay | Admitting: Sports Medicine

## 2023-02-15 ENCOUNTER — Other Ambulatory Visit (HOSPITAL_COMMUNITY): Payer: Self-pay

## 2023-02-15 ENCOUNTER — Ambulatory Visit: Payer: No Typology Code available for payment source | Admitting: Sports Medicine

## 2023-02-15 DIAGNOSIS — M216X9 Other acquired deformities of unspecified foot: Secondary | ICD-10-CM

## 2023-02-15 DIAGNOSIS — M170 Bilateral primary osteoarthritis of knee: Secondary | ICD-10-CM | POA: Diagnosis not present

## 2023-02-15 DIAGNOSIS — M79671 Pain in right foot: Secondary | ICD-10-CM

## 2023-02-15 DIAGNOSIS — M7741 Metatarsalgia, right foot: Secondary | ICD-10-CM

## 2023-02-15 DIAGNOSIS — M79672 Pain in left foot: Secondary | ICD-10-CM

## 2023-02-15 DIAGNOSIS — M7742 Metatarsalgia, left foot: Secondary | ICD-10-CM

## 2023-02-15 NOTE — Progress Notes (Signed)
Sandra Miller - 47 y.o. female MRN [id number]  Date of birth: [birth year]  Office Visit Note: Visit Date: 02/15/2023 PCP: Marcine Matar, MD Referred by: Marcine Matar, MD  Subjective: Chief Complaint  Patient presents with   Right Foot - Pain   Left Foot - Pain   HPI: Sandra Miller is a pleasant 47 y.o. female who presents today for acute on chronic bilateral foot pain.  Sandra Miller has had bilateral foot pain for a few months but has been worse over the past few weeks.  Most of her pain is over the mid and distal foot and Xu pain shoots into the toes.  She has pain on the plantar surface of the metatarsals.  She also has some pain over the medial aspect of the arch of the foot.  Has been told she has flatfeet.  She has tried multiple over-the-counter orthotics and changing shoes without much relief.  She does have colitis so is unable to take NSAIDs.  Past with Tylenol without relief.  Has a complicated history of cancer status post chemo and radiation, last treatment was in August 2022. Does have knee arthritis with recurrent swelling in the past that has responded to aspiration and injection.  Pertinent ROS were reviewed with the patient and found to be negative unless otherwise specified above in HPI.   Assessment & Plan: Visit Diagnoses:  1. Bilateral foot pain   2. Bilateral primary osteoarthritis of knee   3. Metatarsalgia of both feet   4. Loss of transverse plantar arch, unspecified laterality    Plan: Discussed possible etiologies of her bilateral foot pain with Naba today, she certainly has signs and symptoms of metatarsalgia and has rather significant loss of her transverse arch as well as longitudinal arch of the foot which is likely contributing.  I wonder how much of her pain is secondary to previous chemoradiation she received.  She does have some clicking with mortars test as well some Morton's neuroma is also a consideration.  We fit her for metatarsal  pads into her shoe inserts today, she found these to be comfortable.  We will attempt to calm down her inflammation and acute pain with a 6-day course of methylprednisolone.  We will see how she is doing in about 3-4 weeks and she will follow-up if not improved.  Additional treatment considerations: Morton's neuroma injection, low-dose nightly Elavil, advanced imaging  Follow-up: Return for F/u in 3-4 weeks for bilateral foot pain.   Meds & Orders: No orders of the defined types were placed in this encounter.   Orders Placed This Encounter  Procedures   XR Foot Complete Right   XR Foot Complete Left     Procedures: No procedures performed      Clinical History: No specialty comments available.  She reports that she has been smoking cigarettes. She has a 11.00 pack-year smoking history. She has never used smokeless tobacco.  Recent Labs    08/18/22 1511  HGBA1C 5.8    Objective:   Vital Signs: There were no vitals taken for this visit.  Physical Exam  Gen: Well-appearing, in no acute distress; non-toxic CV:  Well-perfused. Warm.  Resp: Breathing unlabored on room air; no wheezing. Psych: Fluid speech in conversation; appropriate affect; normal thought process Neuro: Sensation intact throughout. No gross coordination deficits.   Ortho Exam - Bilateral feet: There is no ankle foot effusion.  There is rather tenderness to palpation over the plantar aspect of both metatarsal.  Positive Mulder's click on the right, Mulder's squeeze does reproduce pain bilaterally.  There is mild TTP with bony palpation of the navicular bone.  Gait analysis in stance shows loss of longitudinal arch and moderate to severe transverse arch, left greater than right.  She has splaying of toes 1-2, 2-3 left greater than right.  Imaging: XR Foot Complete Right  Result Date: 02/15/2023 3 views of the right foot including AP, oblique and lateral femoral ordered and reviewed by myself.  Mild loss of  longitudinal arch.  There is mild arthritic spurring of the anterior talus and midfoot region, no advanced arthritis noted.  There is prominence over the medial aspect of the navicular bone, no acute fracture or other bony abnormality noted.  XR Foot Complete Left  Result Date: 02/15/2023 3 views of the left foot including AP, oblique and lateral femoral ordered and reviewed by myself.  Mild loss of longitudinal arch.  There is mild prominence of the medial navicular bone but no acute fracture or otherwise bony abnormality noted.  Early hammering of toes noted.   Past Medical/Family/Surgical/Social History: Medications & Allergies reviewed per EMR, new medications updated. Patient Active Problem List   Diagnosis Date Noted   Migraine with aura and without status migrainosus, not intractable 10/03/2022   Pain in right knee 09/07/2022   Bilateral primary osteoarthritis of knee 09/07/2022   Chronic migraine without aura without status migrainosus, not intractable 07/07/2022   Sebaceous cyst 12/06/2021   Pain in left knee 11/03/2021   Hyperthyroidism 08/13/2021   Bipolar 1 disorder (HCC) 05/07/2021   Obesity (BMI 30.0-34.9) 05/07/2021   Hyperlipidemia 08/12/2020   Prediabetes 08/12/2020   Hypomagnesemia 08/12/2020   Mutation in BRIP1 gene 06/24/2020   Hypokalemia 06/17/2020   Dehydration    Colitis 06/16/2020   Port-A-Cath in place 03/11/2020   Vulvar cancer (HCC) 01/29/2020   Vulvar ulceration 01/23/2020   Essential hypertension 10/31/2019   Nausea and vomiting 12/27/2018   Seasonal allergic rhinitis 12/27/2018   History of ELISA positive for HSV 10/01/2018   Restless leg syndrome 04/01/2016   Nephrolithiasis 03/08/2016   Obstructive pyelonephritis 03/08/2016   Normocytic anemia 03/08/2016   Hidradenitis suppurativa of left axilla 11/03/2015   Loss of weight 09/08/2015   Neck pain on left side 10/22/2014   Genital warts 05/20/2014   GERD (gastroesophageal reflux disease)  12/26/2012   Functional dyspepsia 12/26/2012   Tobacco abuse 08/22/2012   Herpes simplex virus (HSV) infection 02/03/2007   Past Medical History:  Diagnosis Date   Arthritis    Bipolar 1 disorder (HCC)    Cancer (HCC)    vulva, and breast   Complication of anesthesia    wakes up during procedures   Depression    GAD (generalized anxiety disorder)    Genital HSV    currently per pt  no break out 03-22-2016    GERD (gastroesophageal reflux disease)    Graves disease    Hiatal hernia    History of cervical dysplasia    2012 laser ablation   History of esophageal dilatation    for dysphasia -- x2 dilated   History of gastric ulcer    History of Helicobacter pylori infection    remote hx   History of hidradenitis suppurativa    "gets all over body intermittantly"     History of hypertension    no issue since stopped drinking alcohol 2014   History of kidney stones    History of panic attacks    History  of radiation therapy 03/16/2020-05/08/2020   vulva  Dr Antony Blackbird   History of radiation therapy 09/09/2020-10/23/2020   left chest wall/left SCV   Dr Antony Blackbird   Hypertension    Iron deficiency anemia    Left ureteral stone    OCD (obsessive compulsive disorder)    PONV (postoperative nausea and vomiting)    Pre-diabetes    PTSD (post-traumatic stress disorder)    Recovering alcoholic in remission (HCC)    since 2014   RLS (restless legs syndrome)    Smokers' cough (HCC)    Thyroid disease    Urgency of urination    Yeast infection involving the vagina and surrounding area    secondary to taking antibiotic   Family History  Problem Relation Age of Onset   Heart disease Father    Lung cancer Father        d. 56   Alcohol abuse Father    Heart disease Mother    Depression Mother    Anxiety disorder Mother    Drug abuse Brother    Alcohol abuse Brother    Drug abuse Brother    ADD / ADHD Brother    Colon polyps Brother    Cancer Paternal Grandfather         "stomach"   Diabetes Maternal Grandfather    Diabetes Paternal Grandmother    Kidney disease Maternal Uncle    Cirrhosis Cousin        alcoholic   Anxiety disorder Maternal Aunt    Depression Maternal Aunt    Cancer Cousin        maternal; ovarian cancer or other "female" cancer?   Lung cancer Paternal Uncle 4   Throat cancer Cousin        paternal; dx 75s   Lung cancer Cousin        paternal; dx 12s   Past Surgical History:  Procedure Laterality Date   CESAREAN SECTION  1995   w/  Bilateral Tubal Ligation   COLONOSCOPY  last one 08-09-2013   CYSTOSCOPY W/ URETERAL STENT PLACEMENT Left 03/29/2016   Procedure: CYSTOSCOPY WITH STENT REPLACEMENT;  Surgeon: Hildred Laser, MD;  Location: [facility];  Service: Urology;  Laterality: Left;   CYSTOSCOPY WITH RETROGRADE PYELOGRAM, URETEROSCOPY AND STENT PLACEMENT Left 03/08/2016   Procedure: CYSTOSCOPY WITH  LEFT RETROGRADE PYELOGRAM, AND STENT PLACEMENT;  Surgeon: Hildred Laser, MD;  Location: [facility];  Service: Urology;  Laterality: Left;   CYSTOSCOPY/RETROGRADE/URETEROSCOPY/STONE EXTRACTION WITH BASKET Left 03/29/2016   Procedure: CYSTOSCOPY/RETROGRADE/URETEROSCOPY/STONE EXTRACTION WITH BASKET;  Surgeon: Hildred Laser, MD;  Location: [facility];  Service: Urology;  Laterality: Left;   ENDOMETRIAL ABLATION W/ NOVASURE  04-01-2010   ESOPHAGOGASTRODUODENOSCOPY  last one 08-09-2013   KNEE ARTHROSCOPY Left as teen   LASER ABLATION OF THE CERVIX  2012 approx   MASTECTOMY WITH AXILLARY LYMPH NODE DISSECTION Left 07/27/2020   Procedure: LEFT MASTECTOMY WITH LEFT RADIOACTIVE SEED GUIDED TARGETED AXILLARY LYMPH NODE DISSECTION;  Surgeon: Ovidio Kin, MD;  Location: [facility];  Service: General;  Laterality: Left;   PORT-A-CATH REMOVAL Right 05/18/2021   Procedure: REMOVAL PORT-A-CATH;  Surgeon: Manus Rudd, MD;  Location: [facility];  Service: General;  Laterality: Right;   PORTACATH PLACEMENT Right 03/06/2020    Procedure: INSERTION PORT-A-CATH WITH ULTRASOUND GUIDANCE;  Surgeon: Ovidio Kin, MD;  Location: [facility];  Service: General;  Laterality: Right;   ROBOTIC ASSISTED TOTAL HYSTERECTOMY WITH BILATERAL SALPINGO OOPHERECTOMY N/A 05/18/2021  Procedure: XI ROBOTIC ASSISTED TOTAL HYSTERECTOMY WITH BILATERAL SALPINGO OOPHORECTOMY;  Surgeon: Adolphus Birchwood, MD;  Location: [facility];  Service: Gynecology;  Laterality: N/A;   TRANSTHORACIC ECHOCARDIOGRAM  05-19-2006   lvsf normal, ef 55-65%, there was mild flattening of the interventricular septum during diastoli/  RV size at upper limits normal   TUBAL LIGATION     VULVA /PERINEUM BIOPSY N/A 05/18/2021   Procedure: VULVAR BIOPSY;  Surgeon: Adolphus Birchwood, MD;  Location: [facility];  Service: Gynecology;  Laterality: N/A;   WISDOM TOOTH EXTRACTION  age 34 's   Social History   Occupational History   Not on file  Tobacco Use   Smoking status: Every Day    Packs/day: 0.50    Years: 22.00    Additional pack years: 0.00    Total pack years: 11.00    Types: Cigarettes   Smokeless tobacco: Never   Tobacco comments:    Recently started a smoking cessation class.   Vaping Use   Vaping Use: Never used  Substance and Sexual Activity   Alcohol use: Not Currently   Drug use: Yes    Types: Marijuana   Sexual activity: Yes    Partners: Male    Birth control/protection: Surgical

## 2023-02-15 NOTE — Telephone Encounter (Signed)
Received PA approval for AJOVY 12-09-2022 thru 03-10-2023 thru RX benefits. Will need to be reauthorized as gets closer to 03-10-2023. They will want to know if you are having decreased migraine frequency and severity.

## 2023-02-15 NOTE — Progress Notes (Signed)
Bilateral foot pain Weeks of pain, no injury Pain shooting into toes No numbness/tingling  Pain is all over; no "pin point" area of pain Has tried orthotics with no relief States the pain is now affecting the knees Has tried tylenol with no relief

## 2023-02-16 ENCOUNTER — Encounter: Payer: Self-pay | Admitting: Sports Medicine

## 2023-02-16 ENCOUNTER — Other Ambulatory Visit (HOSPITAL_COMMUNITY): Payer: Self-pay

## 2023-02-16 MED ORDER — METHYLPREDNISOLONE 4 MG PO TBPK
ORAL_TABLET | ORAL | 0 refills | Status: DC
  Filled 2023-02-16: qty 21, 6d supply, fill #0

## 2023-02-16 MED ORDER — AJOVY 225 MG/1.5ML ~~LOC~~ SOAJ
225.0000 mg | SUBCUTANEOUS | 8 refills | Status: DC
Start: 2023-02-16 — End: 2023-04-26
  Filled 2023-02-16 – 2023-03-01 (×2): qty 1.5, 30d supply, fill #0

## 2023-02-16 NOTE — Addendum Note (Signed)
Addended by: Jamie Kato III on: 02/16/2023 05:00 PM   Modules accepted: Orders

## 2023-02-17 ENCOUNTER — Other Ambulatory Visit (HOSPITAL_COMMUNITY): Payer: Self-pay

## 2023-02-24 ENCOUNTER — Other Ambulatory Visit (HOSPITAL_COMMUNITY): Payer: Self-pay

## 2023-03-01 ENCOUNTER — Other Ambulatory Visit (HOSPITAL_COMMUNITY): Payer: Self-pay

## 2023-03-01 ENCOUNTER — Other Ambulatory Visit (HOSPITAL_COMMUNITY): Payer: Self-pay | Admitting: Psychiatry

## 2023-03-01 DIAGNOSIS — F319 Bipolar disorder, unspecified: Secondary | ICD-10-CM

## 2023-03-01 DIAGNOSIS — F411 Generalized anxiety disorder: Secondary | ICD-10-CM

## 2023-03-02 ENCOUNTER — Other Ambulatory Visit: Payer: Self-pay | Admitting: Internal Medicine

## 2023-03-02 ENCOUNTER — Other Ambulatory Visit: Payer: Self-pay

## 2023-03-02 ENCOUNTER — Other Ambulatory Visit (HOSPITAL_COMMUNITY): Payer: Self-pay

## 2023-03-02 ENCOUNTER — Telehealth (HOSPITAL_COMMUNITY): Payer: No Typology Code available for payment source | Admitting: Psychiatry

## 2023-03-02 ENCOUNTER — Encounter: Payer: Self-pay | Admitting: Internal Medicine

## 2023-03-02 ENCOUNTER — Encounter (HOSPITAL_COMMUNITY): Payer: Self-pay | Admitting: Psychiatry

## 2023-03-02 VITALS — Wt 195.0 lb

## 2023-03-02 DIAGNOSIS — F411 Generalized anxiety disorder: Secondary | ICD-10-CM | POA: Diagnosis not present

## 2023-03-02 DIAGNOSIS — E876 Hypokalemia: Secondary | ICD-10-CM

## 2023-03-02 DIAGNOSIS — F319 Bipolar disorder, unspecified: Secondary | ICD-10-CM

## 2023-03-02 MED ORDER — FLUOXETINE HCL 10 MG PO CAPS
30.0000 mg | ORAL_CAPSULE | Freq: Every day | ORAL | 2 refills | Status: DC
Start: 2023-03-02 — End: 2023-05-11
  Filled 2023-03-02 – 2023-03-19 (×3): qty 90, 30d supply, fill #0
  Filled 2023-04-18: qty 90, 30d supply, fill #1

## 2023-03-02 MED ORDER — SPIRONOLACTONE 25 MG PO TABS
12.5000 mg | ORAL_TABLET | Freq: Every day | ORAL | 1 refills | Status: DC
Start: 2023-03-02 — End: 2023-10-02
  Filled 2023-03-02: qty 15, 30d supply, fill #0
  Filled 2023-03-24: qty 15, 30d supply, fill #1
  Filled 2023-04-21: qty 15, 30d supply, fill #2
  Filled 2023-05-25: qty 15, 30d supply, fill #3
  Filled 2023-06-26: qty 15, 30d supply, fill #4
  Filled 2023-07-26: qty 15, 30d supply, fill #5
  Filled 2023-08-25: qty 15, 30d supply, fill #6
  Filled 2023-09-22: qty 15, 30d supply, fill #7

## 2023-03-02 MED ORDER — LORAZEPAM 1 MG PO TABS
1.0000 mg | ORAL_TABLET | Freq: Three times a day (TID) | ORAL | 1 refills | Status: DC | PRN
Start: 2023-03-02 — End: 2023-05-11
  Filled 2023-03-02: qty 90, 30d supply, fill #0
  Filled 2023-05-03: qty 90, 30d supply, fill #1

## 2023-03-02 MED ORDER — OLANZAPINE 15 MG PO TABS
15.0000 mg | ORAL_TABLET | Freq: Every day | ORAL | 2 refills | Status: DC
Start: 2023-03-02 — End: 2023-04-12
  Filled 2023-03-02: qty 30, 30d supply, fill #0

## 2023-03-02 NOTE — Progress Notes (Signed)
[facility] MD Virtual Progress Note   Patient Location: Home Provider Location: Office  I connect with patient by telephone and verified that I am speaking with correct person by using two identifiers. I discussed the limitations of evaluation and management by telemedicine and the availability of in person appointments. I also discussed with the patient that there may be a patient responsible charge related to this service. The patient expressed understanding and agreed to proceed.  Sandra Miller [id number] 47 y.o.  03/02/2023 11:50 AM  History of Present Illness:  Patient is evaluated by phone session.  She reported a lot of health concerns which are chronic but not getting better.  Recently she was in the emergency room because of nausea.  Her potassium was low.  She also complaining of restlessness at night.  She feels fatigue, tired and have a lot of swelling in her feet and pain.  She is able to drive but cannot sit too long.  She is working and that has been helpful but also complaining of fatigue and tiredness.  She is upset because not able to get the medicine for her headaches.  She is only taking Tylenol as needed because the new medicine is not approved from the insurance.  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  She lost few pounds since the last visit.  She is taking olanzapine and Prozac.  She takes the Ativan mostly twice a day but there are days when she need to take 3 a day.  She has no tremors, shakes or any EPS.  She denies any mania or any impulsive behavior.  She admitted sometimes get frustrated and irritable but it is due to her chronic health issues and chronic pain.  Past Psychiatric History: H/O bipolar disorder, alcohol dependence and marijuana abuse.  H/O at least 6 inpatient. Tried lithium, Seroquel, Lamictal, Paxil, Celexa, Abilify, Geodon, Klonopin, Depakote, Neurontin and Cymbalta.  No H/o history of suicidal  attempt.       Outpatient Encounter Medications as of 03/02/2023  Medication Sig   acetaminophen (TYLENOL) 500 MG tablet Take 1 tablet (500 mg total) by mouth every 6 (six) hours as needed.   amLODipine (NORVASC) 5 MG tablet Take 1 tablet (5 mg total) by mouth daily.   diclofenac Sodium (VOLTAREN) 1 % GEL Apply 4 grams topically 4  times daily as needed.   FLUoxetine (PROZAC) 10 MG capsule Take 3 capsules (30 mg total) by mouth daily.   Fremanezumab-vfrm (AJOVY) 225 MG/1.5ML SOAJ Inject 225 mg into the skin every 30 (thirty) days.   gabapentin (NEURONTIN) 300 MG capsule Take 1 capsule (300 mg total) by mouth daily. Pt needs office visit.   letrozole (FEMARA) 2.5 MG tablet Take 1 tablet by mouth daily.   letrozole (FEMARA) 2.5 MG tablet Take 1 tablet (2.5 mg total) by mouth daily.   lisinopril (ZESTRIL) 10 MG tablet Take 1 tablet (10 mg total) by mouth daily. Stop the Lisinopril/HCTZ combination   loratadine (CLARITIN) 10 MG tablet Take 10 mg by mouth daily.   LORazepam (ATIVAN) 1 MG tablet Take 1 tablet (1 mg total) by mouth 3 (three) times daily as needed for anxiety   methimazole (TAPAZOLE) 5 MG tablet Take one tablet (5 mg dose) by mouth daily.   methylPREDNISolone (MEDROL DOSEPAK) 4 MG TBPK tablet Take per packet instructions. Taper dosing.   Multiple Vitamin (MULTIVITAMIN WITH MINERALS) TABS tablet Take 1 tablet by mouth daily.   OLANZapine (  ZYPREXA) 15 MG tablet Take 1 tablet (15 mg total) by mouth at bedtime.   omeprazole (PRILOSEC) 40 MG capsule Take 1 capsule (40 mg total) by mouth in the morning and at bedtime.   ondansetron (ZOFRAN-ODT) 8 MG disintegrating tablet Dissolve 1 tablet (8 mg total) by mouth every 8 (eight) hours as needed for nausea or vomiting.   potassium chloride (KLOR-CON M) 10 MEQ tablet Take 1 tablet (10 mEq) by mouth daily (with a 20 mEq tablet for a total of 30 mEq daily).   potassium chloride SA (KLOR-CON M) 20 MEQ tablet Take 1 tablet (20 mEq) by mouth daily.    promethazine (PHENERGAN) 25 MG tablet Take 1 tablet (25 mg total) by mouth every 8 (eight) hours as needed for nausea or vomiting.   propranolol (INDERAL) 10 MG tablet Take 1 tablet by mouth 2 (two) times daily. (Patient taking differently: Take 10 mg by mouth as needed.)   rosuvastatin (CRESTOR) 10 MG tablet Take 1 tablet (10 mg total) by mouth daily.   senna-docusate (SENOKOT-S) 8.6-50 MG tablet Take 2 tablets by mouth at bedtime. For AFTER surgery only, do not take if having diarrhea   Ubrogepant (UBRELVY) 100 MG TABS Take 1 tablet (100 mg total) by mouth every 2 (two) hours as needed. Maximum 200mg  a day.   [DISCONTINUED] prochlorperazine (COMPAZINE) 10 MG tablet TAKE 1 TABLET BY MOUTH EVERY 6 HOURS AS NEEDED FOR NAUSEA OR VOMITING (Patient taking differently: Take 10 mg by mouth every 6 (six) hours as needed for nausea or vomiting.)   No facility-administered encounter medications on file as of 03/02/2023.    Recent Results (from the past 2160 hour(s))  Basic Metabolic Panel     Status: Abnormal   Collection Time: 12/22/22  1:40 PM  Result Value Ref Range   Glucose 102 (H) 70 - 99 mg/dL   BUN 7 6 - 24 mg/dL   Creatinine, Ser 1.19 0.57 - 1.00 mg/dL   eGFR 147 >82 NF/AOZ/3.08   BUN/Creatinine Ratio 12 9 - 23   Sodium 148 (H) 134 - 144 mmol/L   Potassium 3.0 (L) 3.5 - 5.2 mmol/L   Chloride 105 96 - 106 mmol/L   CO2 25 20 - 29 mmol/L   Calcium 10.0 8.7 - 10.2 mg/dL  Basic Metabolic Panel     Status: Abnormal   Collection Time: 12/29/22 11:21 AM  Result Value Ref Range   Glucose 79 70 - 99 mg/dL   BUN 5 (L) 6 - 24 mg/dL   Creatinine, Ser 6.57 0.57 - 1.00 mg/dL   eGFR 846 >96 EX/BMW/4.13   BUN/Creatinine Ratio 8 (L) 9 - 23   Sodium 144 134 - 144 mmol/L   Potassium 3.4 (L) 3.5 - 5.2 mmol/L   Chloride 102 96 - 106 mmol/L   CO2 22 20 - 29 mmol/L   Calcium 10.2 8.7 - 10.2 mg/dL  CBC with Differential/Platelet     Status: None   Collection Time: 01/25/23  9:56 AM  Result Value Ref  Range   WBC 9.7 4.0 - 10.5 K/uL   RBC 4.32 3.87 - 5.11 MIL/uL   Hemoglobin 12.8 12.0 - 15.0 g/dL   HCT 24.4 01.0 - 27.2 %   MCV 90.0 80.0 - 100.0 fL   MCH 29.6 26.0 - 34.0 pg   MCHC 32.9 30.0 - 36.0 g/dL   RDW 53.6 64.4 - 03.4 %   Platelets 255 150 - 400 K/uL   nRBC 0.0 0.0 - 0.2 %  Neutrophils Relative % 60 %   Neutro Abs 5.8 1.7 - 7.7 K/uL   Lymphocytes Relative 34 %   Lymphs Abs 3.2 0.7 - 4.0 K/uL   Monocytes Relative 5 %   Monocytes Absolute 0.5 0.1 - 1.0 K/uL   Eosinophils Relative 1 %   Eosinophils Absolute 0.1 0.0 - 0.5 K/uL   Basophils Relative 0 %   Basophils Absolute 0.0 0.0 - 0.1 K/uL   Immature Granulocytes 0 %   Abs Immature Granulocytes 0.02 0.00 - 0.07 K/uL    Comment: Performed at [facility], [address] [place], [place] [address]  Comprehensive metabolic panel     Status: Abnormal   Collection Time: 01/25/23  9:56 AM  Result Value Ref Range   Sodium 141 135 - 145 mmol/L   Potassium 3.2 (L) 3.5 - 5.1 mmol/L    Comment: HEMOLYSIS AT THIS LEVEL MAY AFFECT RESULT   Chloride 105 98 - 111 mmol/L   CO2 25 22 - 32 mmol/L   Glucose, Bld 106 (H) 70 - 99 mg/dL    Comment: Glucose reference range applies only to samples taken after fasting for at least 8 hours.   BUN <5 (L) 6 - 20 mg/dL   Creatinine, Ser 9.02 0.44 - 1.00 mg/dL   Calcium 9.0 8.9 - 40.9 mg/dL   Total Protein 7.4 6.5 - 8.1 g/dL   Albumin 4.0 3.5 - 5.0 g/dL   AST 33 15 - 41 U/L    Comment: HEMOLYSIS AT THIS LEVEL MAY AFFECT RESULT   ALT 15 0 - 44 U/L    Comment: HEMOLYSIS AT THIS LEVEL MAY AFFECT RESULT   Alkaline Phosphatase 44 38 - 126 U/L   Total Bilirubin 1.3 (H) 0.3 - 1.2 mg/dL    Comment: HEMOLYSIS AT THIS LEVEL MAY AFFECT RESULT   GFR, Estimated >60 >60 mL/min    Comment: (NOTE) Calculated using the CKD-EPI Creatinine Equation (2021)    Anion gap 11 5 - 15    Comment: Performed at [facility] [address] [place], [place] [address]  Lipase,  blood     Status: None   Collection Time: 01/25/23  9:56 AM  Result Value Ref Range   Lipase 32 11 - 51 U/L    Comment: Performed at [facility], [address], [place], [place] [address]  TSH     Status: None   Collection Time: 01/25/23  9:56 AM  Result Value Ref Range   TSH 1.884 0.350 - 4.500 uIU/mL    Comment: Performed by a 3rd Generation assay with a functional sensitivity of <=0.01 uIU/mL. Performed at [facility], [address] [place], [place] [address]      Psychiatric Specialty Exam: Physical Exam  Review of Systems  Constitutional:  Positive for fatigue.  Musculoskeletal:        Feet pain and swelling  Neurological:  Positive for numbness and headaches.  Psychiatric/Behavioral:  Positive for dysphoric mood.        Restless at night    Weight 195 lb (88.5 kg).There is no height or weight on file to calculate BMI.  General Appearance: NA  Eye Contact:  NA  Speech:  Slow  Volume:  Decreased  Mood:  Dysphoric and tired  Affect:  NA  Thought Process:  Descriptions of Associations: Intact  Orientation:  Full (Time, Place, and Person)  Thought Content:  Rumination  Suicidal Thoughts:  No  Homicidal Thoughts:  No  Memory:  Immediate;   Good Recent;   Fair Remote;   Fair  Judgement:  Good  Insight:  Present  Psychomotor Activity:  Decreased  Concentration:  Concentration: Good and Attention Span: Good  Recall:  Good  Fund of Knowledge:  Good  Language:  Good  Akathisia:  No  Handed:  Right  AIMS (if indicated):     Assets:  Communication Skills Desire for Improvement Housing Social Support Talents/Skills Transportation  ADL's:  Intact  Cognition:  WNL  Sleep:  restless     Assessment/Plan: Bipolar 1 disorder (HCC) - Plan: OLANZapine (ZYPREXA) 15 MG tablet, FLUoxetine (PROZAC) 10 MG capsule  GAD (generalized anxiety disorder) - Plan: LORazepam (ATIVAN) 1 MG tablet, FLUoxetine (PROZAC) 10 MG capsule  I  reviewed current medication, recent blood work results.  She has low potassium and she did not have repeat potassium level.  She used to take the gabapentin but it was reduced after she complained of weight gain.  Patient told it was helping her neuropathy, restless sleep and pain.  She is taking only 300 mg and I recommend she can try up the dose however may increase the pain.  She is hoping insurance will approve the new medication for her headaches and that may help her sleep and headaches.  I encouraged to have repeat potassium level because her potassium is low that can also cause some time muscle cramps.  Patient like to have her notes forward to primary care.  She like to keep the current medicine as symptoms are manageable and is stable but like to address her chronic health issues and she has appointment coming up in few weeks with the PCP.  Continue Ativan 1 mg up to 3 times a day, olanzapine 15 mg at bedtime and Prozac 30 mg daily.  Recommend to call us back if she has any question or any concern.  Follow-up in 3 months.  Follow Up Instructions:     I discussed the assessment and treatment plan with the patient. The patient was provided an opportunity to ask questions and all were answered. The patient agreed with the plan and demonstrated an understanding of the instructions.   The patient was advised to call back or seek an in-person evaluation if the symptoms worsen or if the condition fails to improve as anticipated.    Collaboration of Care: Other provider involved in patient's care AEB notes are available in epic to review  Patient/Guardian was advised Release of Information must be obtained prior to any record release in order to collaborate their care with an outside provider. Patient/Guardian was advised if they have not already done so to contact the registration department to sign all necessary forms in order for [place] to release information regarding their care.   Consent:  Patient/Guardian gives verbal consent for treatment and assignment of benefits for services provided during this visit. Patient/Guardian expressed understanding and agreed to proceed.     I provided 25 minutes of non face to face time during this encounter.  Note: This document was prepared by [employer] voice dictation technology and any errors that results from this process are unintentional.    Cleotis Nipper, MD 03/02/2023

## 2023-03-03 ENCOUNTER — Other Ambulatory Visit (HOSPITAL_COMMUNITY): Payer: Self-pay

## 2023-03-03 ENCOUNTER — Other Ambulatory Visit: Payer: Self-pay

## 2023-03-04 ENCOUNTER — Other Ambulatory Visit (HOSPITAL_COMMUNITY): Payer: Self-pay

## 2023-03-06 ENCOUNTER — Other Ambulatory Visit (HOSPITAL_COMMUNITY): Payer: Self-pay

## 2023-03-06 ENCOUNTER — Other Ambulatory Visit: Payer: Self-pay | Admitting: Physician Assistant

## 2023-03-06 ENCOUNTER — Ambulatory Visit: Payer: No Typology Code available for payment source | Admitting: Physician Assistant

## 2023-03-06 ENCOUNTER — Encounter: Payer: Self-pay | Admitting: Physician Assistant

## 2023-03-06 ENCOUNTER — Ambulatory Visit: Payer: Self-pay

## 2023-03-06 VITALS — BP 121/86 | HR 113 | Ht 65.5 in | Wt 190.0 lb

## 2023-03-06 DIAGNOSIS — R112 Nausea with vomiting, unspecified: Secondary | ICD-10-CM

## 2023-03-06 DIAGNOSIS — K529 Noninfective gastroenteritis and colitis, unspecified: Secondary | ICD-10-CM

## 2023-03-06 DIAGNOSIS — K219 Gastro-esophageal reflux disease without esophagitis: Secondary | ICD-10-CM | POA: Diagnosis not present

## 2023-03-06 MED ORDER — PROMETHAZINE HCL 25 MG PO TABS
25.0000 mg | ORAL_TABLET | Freq: Three times a day (TID) | ORAL | 0 refills | Status: DC | PRN
Start: 2023-03-06 — End: 2023-03-06
  Filled 2023-03-06: qty 20, 7d supply, fill #0

## 2023-03-06 MED ORDER — PROMETHAZINE HCL 25 MG PO TABS
25.0000 mg | ORAL_TABLET | Freq: Three times a day (TID) | ORAL | 1 refills | Status: DC | PRN
Start: 2023-03-06 — End: 2023-04-21
  Filled 2023-03-06: qty 20, 7d supply, fill #0
  Filled 2023-03-29: qty 20, 7d supply, fill #1

## 2023-03-06 NOTE — Progress Notes (Unsigned)
Established Patient Office Visit  Subjective   Patient ID: Sandra Miller, female    DOB: 1975/09/10  Age: 47 y.o. MRN: [id number]  Chief Complaint  Patient presents with   Nausea    Vomiting, consistent for a few months, diarrhea is on consistent it is alleviated Otc antidiarrhea medication     She was seen in the emergency department on Jan 25, 2023.  Note from that visit.    Sandra Miller is a 47 y.o. female.   This is a 47 year old female presents with 1 month of intermittent nausea and vomiting.  Patient states she has had no fever or chills.  No associated severe abdominal pain.  Patient states symptoms have been worse when she gets home after work.  She works as a Agricultural engineer.  Denies any urinary symptoms.  No associated with food.  Has been using promethazine with temporary relief.  Called her doctor yesterday to schedule follow-up visit.  Does have a history of GI issues in the past and has been seen by gastroenterology.      Patient given IV fluids here as well as antiemetics and feels much better.  Thyroid function studies are normal.  She has no evidence of severe dehydration.  Mild hypokalemia noted and treated with oral potassium.  She will take this without any issues.  Will follow-up with her doctor.  No indication for imaging at this time    States today that she continues to have nausea on a daily basis.  States that she does continue to have episodes of vomiting.  States that she will vomit water.  States her last episode of vomiting was earlier this morning.  States that she has been scared to eat or drink much due to this.  States that she has been using the Phenergan with some relief but has not had that in the past couple of days due to being out of it.  Denies abdominal bloating, heartburn or acid reflux.  Does endorse high stress levels.  States that she has been having difficulty sleeping, both falling asleep and staying asleep.  States that she did see  psychiatry recently and they did encourage her to work on improving her overall health which they believe will improve her sleep.  States that she has previously failed trazodone.  Endorses significant history of H. pylori "many years ago".  States that she is taking omeprazole twice daily as directed.  States that she did have some episodes of diarrhea that started 2 days ago, states that she has had normal stools since then, states she used Imodium for relief.   I     Past Medical History:  Diagnosis Date   Arthritis    Bipolar 1 disorder (HCC)    Cancer (HCC)    vulva, and breast   Complication of anesthesia    wakes up during procedures   Depression    GAD (generalized anxiety disorder)    Genital HSV    currently per pt  no break out 03-22-2016    GERD (gastroesophageal reflux disease)    Graves disease    Hiatal hernia    History of cervical dysplasia    2012 laser ablation   History of esophageal dilatation    for dysphasia -- x2 dilated   History of gastric ulcer    History of Helicobacter pylori infection    remote hx   History of hidradenitis suppurativa    "gets all over body intermittantly"  History of hypertension    no issue since stopped drinking alcohol 2014   History of kidney stones    History of panic attacks    History of radiation therapy 03/16/2020-05/08/2020   vulva  Dr Antony Blackbird   History of radiation therapy 09/09/2020-10/23/2020   left chest wall/left SCV   Dr Antony Blackbird   Hypertension    Iron deficiency anemia    Left ureteral stone    OCD (obsessive compulsive disorder)    PONV (postoperative nausea and vomiting)    Pre-diabetes    PTSD (post-traumatic stress disorder)    Recovering alcoholic in remission (HCC)    since 2014   RLS (restless legs syndrome)    Smokers' cough (HCC)    Thyroid disease    Urgency of urination    Yeast infection involving the vagina and surrounding area    secondary to taking antibiotic   Social  History   Socioeconomic History   Marital status: Single    Spouse name: Not on file   Number of children: Not on file   Years of education: Not on file   Highest education level: Not on file  Occupational History   Not on file  Tobacco Use   Smoking status: Every Day    Packs/day: 0.50    Years: 22.00    Additional pack years: 0.00    Total pack years: 11.00    Types: Cigarettes   Smokeless tobacco: Never   Tobacco comments:    Recently started a smoking cessation class.   Vaping Use   Vaping Use: Never used  Substance and Sexual Activity   Alcohol use: Not Currently   Drug use: Yes    Types: Marijuana   Sexual activity: Yes    Partners: Male    Birth control/protection: Surgical  Other Topics Concern   Not on file  Social History Narrative   Former [facility] patient.      Was on disability at one point.   Return to the workforce.  40 hours a week at [employer], 10 hours a week on the weekends at [employer] [employer] in [place], [employer] at night 10 to 12 hours a week.      Has grown children, she lives alone with a pet, continues to smoke no alcohol or drug use at this time      History of EtOH abuse and THC use.         Social Determinants of Health   Financial Resource Strain: Low Risk  (07/01/2021)   Overall Financial Resource Strain (CARDIA)    Difficulty of Paying Living Expenses: Not hard at all  Food Insecurity: No Food Insecurity (07/01/2021)   Hunger Vital Sign    Worried About Running Out of Food in the Last Year: Never true    Ran Out of Food in the Last Year: Never true  Transportation Needs: No Transportation Needs (07/01/2021)   PRAPARE - Administrator, Civil Service (Medical): No    Lack of Transportation (Non-Medical): No  Physical Activity: Inactive (07/01/2021)   Exercise Vital Sign    Days of Exercise per Week: 0 days    Minutes of Exercise per Session: 0 min  Stress: Stress Concern Present (07/01/2021)   [employer] of Occupational Health - Occupational Stress Questionnaire    Feeling of Stress : Rather much  Social Connections: Socially Isolated (07/01/2021)   Social Connection and Isolation Panel [NHANES]    Frequency of Communication with Friends  and Family: More than three times a week    Frequency of Social Gatherings with Friends and Family: More than three times a week    Attends Religious Services: Never    Database administrator or Organizations: No    Attends Banker Meetings: Never    Marital Status: Never married  Intimate Partner Violence: Not At Risk (07/01/2021)   Humiliation, Afraid, Rape, and Kick questionnaire    Fear of Current or Ex-Partner: No    Emotionally Abused: No    Physically Abused: No    Sexually Abused: No   Family History  Problem Relation Age of Onset   Heart disease Father    Lung cancer Father        d. 74   Alcohol abuse Father    Heart disease Mother    Depression Mother    Anxiety disorder Mother    Drug abuse Brother    Alcohol abuse Brother    Drug abuse Brother    ADD / ADHD Brother    Colon polyps Brother    Cancer Paternal Grandfather        "stomach"   Diabetes Maternal Grandfather    Diabetes Paternal Grandmother    Kidney disease Maternal Uncle    Cirrhosis Cousin        alcoholic   Anxiety disorder Maternal Aunt    Depression Maternal Aunt    Cancer Cousin        maternal; ovarian cancer or other "female" cancer?   Lung cancer Paternal Uncle 67   Throat cancer Cousin        paternal; dx 68s   Lung cancer Cousin        paternal; dx 49s   Allergies  Allergen Reactions   Dilaudid [Hydromorphone Hcl] Other (See Comments)    Pt became confused, pulled out iv's, does not remember anything   Aspirin Hives    States able to tolerate Goody Powders and Ibuprofen without any problem    Depakote [Divalproex Sodium] Nausea And Vomiting   Minocycline Hives    Review of Systems  Constitutional:  Negative for chills  and fever.  HENT: Negative.    Eyes: Negative.   Respiratory:  Negative for shortness of breath.   Cardiovascular:  Negative for chest pain.  Gastrointestinal:  Positive for diarrhea, nausea and vomiting.  Genitourinary: Negative.   Musculoskeletal: Negative.   Skin: Negative.   Neurological: Negative.   Endo/Heme/Allergies: Negative.   Psychiatric/Behavioral: Negative.        Objective:     BP 121/86 (BP Location: Left Arm, Patient Position: Sitting, Cuff Size: Large)   Pulse (!) 113   Ht 5' 5.5" (1.664 m)   Wt 190 lb (86.2 kg)   SpO2 96%   BMI 31.14 kg/m     Physical Exam Vitals and nursing note reviewed.  Constitutional:      Appearance: Normal appearance.  HENT:     Head: Normocephalic and atraumatic.     Right Ear: External ear normal.     Left Ear: External ear normal.     Nose: Nose normal.     Mouth/Throat:     Mouth: Mucous membranes are moist.     Pharynx: Oropharynx is clear.  Eyes:     Extraocular Movements: Extraocular movements intact.     Conjunctiva/sclera: Conjunctivae normal.     Pupils: Pupils are equal, round, and reactive to light.  Cardiovascular:     Rate and Rhythm: Normal rate and  regular rhythm.     Pulses: Normal pulses.     Heart sounds: Normal heart sounds.  Pulmonary:     Effort: Pulmonary effort is normal.     Breath sounds: Normal breath sounds.  Abdominal:     General: There is no distension.     Tenderness: There is no abdominal tenderness.  Musculoskeletal:        General: Normal range of motion.     Cervical back: Normal range of motion and neck supple.  Skin:    General: Skin is warm and dry.  Neurological:     General: No focal deficit present.     Mental Status: She is alert and oriented to person, place, and time.  Psychiatric:        Mood and Affect: Mood normal.        Behavior: Behavior normal.        Thought Content: Thought content normal.        Judgment: Judgment normal.        Assessment & Plan:    Problem List Items Addressed This Visit       Digestive   GERD (gastroesophageal reflux disease) - Primary   Relevant Orders   H. pylori breath test   Ambulatory referral to Gastroenterology   Nausea and vomiting   Relevant Orders   H. pylori breath test   Ambulatory referral to Gastroenterology   Other Visit Diagnoses     Gastroenteritis       Relevant Medications   promethazine (PHENERGAN) 25 MG tablet     1. Nausea and vomiting, unspecified vomiting type Continue Phenergan, rule out H. pylori.  Patient does have labs pending from primary care provider to reevaluate potassium.  [facility]  Refer to gastroenterology for further evaluation.  Patient education given on supportive care, red flags given for prompt reevaluation - H. pylori breath test; Future - Ambulatory referral to Gastroenterology - promethazine (PHENERGAN) 25 MG tablet; Take 1 tablet (25 mg total) by mouth every 8 (eight) hours as needed for nausea or vomiting.  Dispense: 20 tablet; Refill: 1  2. Gastroesophageal reflux disease, unspecified whether esophagitis present  - H. pylori breath test; Future - Ambulatory referral to Gastroenterology    I have reviewed the patient's medical history (PMH, PSH, Social History, Family History, Medications, and allergies) , and have been updated if relevant. I spent 30 minutes reviewing chart and  face to face time with patient.    No follow-ups on file.    Kasandra Knudsen Mayers, PA-C

## 2023-03-06 NOTE — Telephone Encounter (Signed)
Summary: vomiting, diarrhea   Experiencing abdominal issues for months per pt Sandra Miller.   Patient has been vomiting/diarrhea this morning (4 am),. First appt available July 18 Dr Sharon Seller.          Called pt - left message to return our call.

## 2023-03-06 NOTE — Telephone Encounter (Signed)
Reason for Disposition  [1] MILD vomiting with diarrhea AND [2] present > 5 days  Answer Assessment - Initial Assessment Questions 1. VOMITING SEVERITY: "How many times have you vomited in the past 24 hours?"     - MILD:  1 - 2 times/day    - MODERATE: 3 - 5 times/day, decreased oral intake without significant weight loss or symptoms of dehydration    - SEVERE: 6 or more times/day, vomits everything or nearly everything, with significant weight loss, symptoms of dehydration      I'm vomiting and having diarrhea.   I was seen in ED on 23rd.   They told me I need to see my PCP.  They drew blood.    My potassium was low but I'm already on potassium.    I have seen a dr at [facility] and did labs.   It was something viral.   But this has been going on for several months.    2. ONSET: "When did the vomiting begin?"      A couple of months.   I have nausea every day.   I'm hardly able to work due to the nausea.    I vomited yesterday.   Last time was 4 AM this morning.   I'm having diarrhea too.   I took some Imodium which helped.   3. FLUIDS: "What fluids or food have you vomited up today?" "Have you been able to keep any fluids down?"     Last time was last time I could keep anything down.   I'm drinking a lot of water.   I ate 4 crackers and vomited it.     I took my morning medication and took a sip of water.   I've not vomited so dar. 4. ABDOMEN PAIN: "Are your having any abdomen pain?" If Yes : "How bad is it and what does it feel like?" (e.g., crampy, dull, intermittent, constant)      No pain 5. DIARRHEA: "Is there any diarrhea?" If Yes, ask: "How many times today?"      Yes     The diarrhea comes and goes but the nausea is most of the time. 6. CONTACTS: "Is there anyone else in the family with the same symptoms?"      Not asked 7. CAUSE: "What do you think is causing your vomiting?"     Don't know 8. HYDRATION STATUS: "Any signs of dehydration?" (e.g., dry mouth [not only dry lips], too weak to  stand) "When did you last urinate?"     I'm peeing fine.   I always seem light headed even with drinking with drinking a lot of water. I had cancer 3 yrs ago.   Breast cancer and vulvar cancer. 9. OTHER SYMPTOMS: "Do you have any other symptoms?" (e.g., fever, headache, vertigo, vomiting blood or coffee grounds, recent head injury)     No fever.    I have migraines but on medication for that. 10. PREGNANCY: "Is there any chance you are pregnant?" "When was your last menstrual period?"       Not asked  Protocols used: Vomiting-A-AH  Chief Complaint: Vomiting and diarrhea  Been seen in ED.   Symptoms: Nausea for 2 months.   Intermittent diarrhea.   Vomited yesterday and this morning. Frequency: Last vomited this morning at 4 AM. Pertinent Negatives: Patient denies abd pain or fever.    Disposition: [] ED /[] Urgent Care (no appt availability in office) / [] Appointment(In office/virtual)/ []  [place] [facility]/ [] Home Care/ [] Refused Recommended Disposition /[x] [facility]/ []  Follow-up with PCP Additional Notes: No appts available with any providers at [facility].   She has an appt on 04/13/2023 with Dr. Laural Benes at 1:30 that we kept on the schedule.   I referred her to the [facility] which she was agreeable to going.   I gave her the location and times for today.

## 2023-03-06 NOTE — Telephone Encounter (Signed)
Patient seen on [facility] today

## 2023-03-06 NOTE — Progress Notes (Signed)
error 

## 2023-03-06 NOTE — Patient Instructions (Signed)
You are going to take Phenergan every 8 hours as needed, I do encourage you to use this scheduled over the next 3 to 4 days so you can increase your hydration and nutrition.  I encourage you to work on improving your sleep, you can try adding melatonin 5 mg to your regimen.  We will call you with your lab result when it is available.  I did start a referral for you to be seen by gastroenterology, they will reach out to you to schedule an appointment.  I hope that you feel better soon  Roney Jaffe, PA-C Physician Assistant [facility] [email or web address]   Nausea, Adult Nausea is the feeling of having an upset stomach or that you are about to vomit. Nausea on its own is not usually a serious concern, but it may be an early sign of a more serious medical problem. As nausea gets worse, it can lead to vomiting. If vomiting develops, or if you are not able to drink enough fluids, you are at risk of becoming dehydrated. Dehydration can make you tired and thirsty, cause you to have a dry mouth, and decrease how often you urinate. Older adults and people with other diseases or a weak disease-fighting system (immune system) are at higher risk for dehydration. The main goals of treating your nausea are: To relieve your nausea. To limit repeated nausea episodes. To prevent vomiting and dehydration. Follow these instructions at home: Watch your symptoms for any changes. Tell your health care provider about them. Eating and drinking     Take an oral rehydration solution (ORS). This is a drink that is sold at pharmacies and retail stores. Drink clear fluids slowly and in small amounts as you are able. Clear fluids include water, ice chips, low-calorie sports drinks, and fruit juice that has water added (diluted fruit juice). Eat bland, easy-to-digest foods in small amounts as you are able. These foods include bananas, applesauce, rice, lean  meats, toast, and crackers. Avoid drinking fluids that contain a lot of sugar or caffeine, such as energy drinks, sports drinks, and soda. Avoid alcohol. Avoid spicy or fatty foods. General instructions Take over-the-counter and prescription medicines only as told by your health care provider. Rest at home while you recover. Drink enough fluid to keep your urine pale yellow. Breathe slowly and deeply when you feel nauseous. Avoid smelling things that have strong odors. Wash your hands often using soap and water for at least 20 seconds. If soap and water are not available, use hand sanitizer. Make sure that everyone in your household washes their hands well and often. Keep all follow-up visits. This is important. Contact a health care provider if: Your nausea gets worse. Your nausea does not go away after two days. You vomit multiple times. You cannot drink fluids without vomiting. You have any of the following: New symptoms. A fever. A headache. Muscle cramps. A rash. Pain while urinating. You feel light-headed or dizzy. Get help right away if: You have pain in your chest, neck, arm, or jaw. You feel extremely weak or you faint. You have vomit that is bright red or looks like coffee grounds. You have bloody or black stools (feces) or stools that look like tar. You have a severe headache, a stiff neck, or both. You have severe pain, cramping, or bloating in your abdomen. You have difficulty breathing or are breathing very quickly. Your heart is beating very quickly. Your skin feels cold and clammy. You feel confused. You  have signs of dehydration, such as: Dark urine, very little urine, or no urine. Cracked lips. Dry mouth. Sunken eyes. Sleepiness. Weakness. These symptoms may be an emergency. Get help right away. Call 911. Do not wait to see if the symptoms will go away. Do not drive yourself to the hospital. Summary Nausea is the feeling that you have an upset stomach  or that you are about to vomit. Nausea on its own is not usually a serious concern, but it may be an early sign of a more serious medical problem. If vomiting develops, or if you are not able to drink enough fluids, you are at risk of becoming dehydrated. Follow recommendations for eating and drinking and take over-the-counter and prescription medicines only as told by your health care provider. Contact a health care provider right away if your symptoms worsen or you have new symptoms. Keep all follow-up visits. This is important. This information is not intended to replace advice given to you by your health care provider. Make sure you discuss any questions you have with your health care provider. Document Revised: 02/26/2021 Document Reviewed: 02/26/2021 Elsevier Patient Education  2024 [employer].

## 2023-03-07 ENCOUNTER — Other Ambulatory Visit: Payer: No Typology Code available for payment source

## 2023-03-07 ENCOUNTER — Encounter: Payer: Self-pay | Admitting: Physician Assistant

## 2023-03-10 ENCOUNTER — Telehealth (HOSPITAL_COMMUNITY): Payer: No Typology Code available for payment source | Admitting: Psychiatry

## 2023-03-16 ENCOUNTER — Ambulatory Visit: Payer: No Typology Code available for payment source | Attending: Internal Medicine

## 2023-03-16 DIAGNOSIS — E876 Hypokalemia: Secondary | ICD-10-CM

## 2023-03-16 DIAGNOSIS — K219 Gastro-esophageal reflux disease without esophagitis: Secondary | ICD-10-CM

## 2023-03-16 DIAGNOSIS — R112 Nausea with vomiting, unspecified: Secondary | ICD-10-CM

## 2023-03-17 ENCOUNTER — Other Ambulatory Visit (HOSPITAL_COMMUNITY): Payer: Self-pay

## 2023-03-17 ENCOUNTER — Other Ambulatory Visit: Payer: Self-pay | Admitting: Internal Medicine

## 2023-03-17 LAB — H. PYLORI BREATH TEST: H pylori Breath Test: NEGATIVE

## 2023-03-17 LAB — BASIC METABOLIC PANEL
BUN/Creatinine Ratio: 9 (ref 9–23)
BUN: 5 mg/dL — ABNORMAL LOW (ref 6–24)
CO2: 24 mmol/L (ref 20–29)
Calcium: 10.1 mg/dL (ref 8.7–10.2)
Chloride: 103 mmol/L (ref 96–106)
Creatinine, Ser: 0.55 mg/dL — ABNORMAL LOW (ref 0.57–1.00)
Glucose: 83 mg/dL (ref 70–99)
Potassium: 3.7 mmol/L (ref 3.5–5.2)
Sodium: 144 mmol/L (ref 134–144)
eGFR: 112 mL/min/{1.73_m2} (ref >59)

## 2023-03-20 ENCOUNTER — Other Ambulatory Visit: Payer: Self-pay

## 2023-03-22 ENCOUNTER — Telehealth: Payer: Self-pay | Admitting: Internal Medicine

## 2023-03-22 ENCOUNTER — Encounter: Payer: Self-pay | Admitting: Internal Medicine

## 2023-03-22 DIAGNOSIS — Z13820 Encounter for screening for osteoporosis: Secondary | ICD-10-CM

## 2023-03-22 DIAGNOSIS — Z79811 Long term (current) use of aromatase inhibitors: Secondary | ICD-10-CM

## 2023-03-22 NOTE — Telephone Encounter (Signed)
-----   Message from Tamsen Meek sent at 03/20/2023  8:32 PM EDT ----- Regarding: RE: Yes. We usually get a bone density every 2 years Thanks for taking care of it Vinay ----- Message ----- From: Marcine Matar, MD Sent: 03/20/2023   6:30 PM EDT To: Serena Croissant, MD  Good afternoon Dr. Pamelia Hoit.  I am the PCP for this patient.  She sent me a MyChart message inquiring about getting a bone density study done.  Does she need to have a bone density study since she is on Femara?

## 2023-03-22 NOTE — Telephone Encounter (Signed)
Pt sent Mychart message informing her that BMD study has been ordered after I heard from Dr. Pamelia Hoit.

## 2023-03-27 ENCOUNTER — Other Ambulatory Visit (HOSPITAL_COMMUNITY): Payer: Self-pay

## 2023-03-28 ENCOUNTER — Other Ambulatory Visit (HOSPITAL_COMMUNITY): Payer: Self-pay

## 2023-03-31 ENCOUNTER — Inpatient Hospital Stay (HOSPITAL_COMMUNITY): Payer: PRIVATE HEALTH INSURANCE

## 2023-03-31 ENCOUNTER — Inpatient Hospital Stay (HOSPITAL_COMMUNITY)
Admission: EM | Admit: 2023-03-31 | Discharge: 2023-04-05 | DRG: 101 | Disposition: A | Discharge status: Home / Self Care | Payer: PRIVATE HEALTH INSURANCE | Attending: Internal Medicine | Admitting: Internal Medicine

## 2023-03-31 ENCOUNTER — Emergency Department (HOSPITAL_COMMUNITY): Payer: PRIVATE HEALTH INSURANCE

## 2023-03-31 ENCOUNTER — Other Ambulatory Visit: Payer: Self-pay

## 2023-03-31 ENCOUNTER — Encounter (HOSPITAL_COMMUNITY): Payer: Self-pay

## 2023-03-31 DIAGNOSIS — R569 Unspecified convulsions: Secondary | ICD-10-CM

## 2023-03-31 DIAGNOSIS — F319 Bipolar disorder, unspecified: Secondary | ICD-10-CM | POA: Diagnosis present

## 2023-03-31 DIAGNOSIS — G9389 Other specified disorders of brain: Secondary | ICD-10-CM | POA: Diagnosis present

## 2023-03-31 DIAGNOSIS — Z888 Allergy status to other drugs, medicaments and biological substances status: Secondary | ICD-10-CM

## 2023-03-31 DIAGNOSIS — R7303 Prediabetes: Secondary | ICD-10-CM | POA: Diagnosis present

## 2023-03-31 DIAGNOSIS — E876 Hypokalemia: Secondary | ICD-10-CM | POA: Diagnosis present

## 2023-03-31 DIAGNOSIS — E669 Obesity, unspecified: Secondary | ICD-10-CM | POA: Diagnosis present

## 2023-03-31 DIAGNOSIS — Z79811 Long term (current) use of aromatase inhibitors: Secondary | ICD-10-CM

## 2023-03-31 DIAGNOSIS — R739 Hyperglycemia, unspecified: Secondary | ICD-10-CM | POA: Diagnosis present

## 2023-03-31 DIAGNOSIS — K219 Gastro-esophageal reflux disease without esophagitis: Secondary | ICD-10-CM | POA: Diagnosis present

## 2023-03-31 DIAGNOSIS — E785 Hyperlipidemia, unspecified: Secondary | ICD-10-CM | POA: Diagnosis present

## 2023-03-31 DIAGNOSIS — M6282 Rhabdomyolysis: Secondary | ICD-10-CM | POA: Diagnosis present

## 2023-03-31 DIAGNOSIS — Z811 Family history of alcohol abuse and dependence: Secondary | ICD-10-CM

## 2023-03-31 DIAGNOSIS — F429 Obsessive-compulsive disorder, unspecified: Secondary | ICD-10-CM | POA: Diagnosis present

## 2023-03-31 DIAGNOSIS — Z6832 Body mass index (BMI) 32.0-32.9, adult: Secondary | ICD-10-CM | POA: Diagnosis not present

## 2023-03-31 DIAGNOSIS — G9340 Encephalopathy, unspecified: Secondary | ICD-10-CM | POA: Diagnosis not present

## 2023-03-31 DIAGNOSIS — Z9221 Personal history of antineoplastic chemotherapy: Secondary | ICD-10-CM | POA: Diagnosis not present

## 2023-03-31 DIAGNOSIS — I10 Essential (primary) hypertension: Secondary | ICD-10-CM | POA: Diagnosis present

## 2023-03-31 DIAGNOSIS — Z8544 Personal history of malignant neoplasm of other female genital organs: Secondary | ICD-10-CM

## 2023-03-31 DIAGNOSIS — R0689 Other abnormalities of breathing: Secondary | ICD-10-CM | POA: Diagnosis not present

## 2023-03-31 DIAGNOSIS — Y92009 Unspecified place in unspecified non-institutional (private) residence as the place of occurrence of the external cause: Secondary | ICD-10-CM | POA: Diagnosis not present

## 2023-03-31 DIAGNOSIS — Z813 Family history of other psychoactive substance abuse and dependence: Secondary | ICD-10-CM

## 2023-03-31 DIAGNOSIS — F17210 Nicotine dependence, cigarettes, uncomplicated: Secondary | ICD-10-CM | POA: Diagnosis present

## 2023-03-31 DIAGNOSIS — Z8249 Family history of ischemic heart disease and other diseases of the circulatory system: Secondary | ICD-10-CM

## 2023-03-31 DIAGNOSIS — E860 Dehydration: Secondary | ICD-10-CM | POA: Diagnosis present

## 2023-03-31 DIAGNOSIS — Z9012 Acquired absence of left breast and nipple: Secondary | ICD-10-CM | POA: Diagnosis not present

## 2023-03-31 DIAGNOSIS — G40901 Epilepsy, unspecified, not intractable, with status epilepticus: Secondary | ICD-10-CM | POA: Diagnosis present

## 2023-03-31 DIAGNOSIS — Z818 Family history of other mental and behavioral disorders: Secondary | ICD-10-CM

## 2023-03-31 DIAGNOSIS — Z801 Family history of malignant neoplasm of trachea, bronchus and lung: Secondary | ICD-10-CM

## 2023-03-31 DIAGNOSIS — E039 Hypothyroidism, unspecified: Secondary | ICD-10-CM | POA: Diagnosis present

## 2023-03-31 DIAGNOSIS — E872 Acidosis, unspecified: Secondary | ICD-10-CM | POA: Diagnosis present

## 2023-03-31 DIAGNOSIS — Z923 Personal history of irradiation: Secondary | ICD-10-CM | POA: Diagnosis not present

## 2023-03-31 DIAGNOSIS — Z853 Personal history of malignant neoplasm of breast: Secondary | ICD-10-CM

## 2023-03-31 DIAGNOSIS — F411 Generalized anxiety disorder: Secondary | ICD-10-CM | POA: Diagnosis present

## 2023-03-31 DIAGNOSIS — Z83719 Family history of colon polyps, unspecified: Secondary | ICD-10-CM

## 2023-03-31 DIAGNOSIS — Z808 Family history of malignant neoplasm of other organs or systems: Secondary | ICD-10-CM

## 2023-03-31 DIAGNOSIS — F4310 Post-traumatic stress disorder, unspecified: Secondary | ICD-10-CM | POA: Diagnosis present

## 2023-03-31 DIAGNOSIS — W19XXXA Unspecified fall, initial encounter: Secondary | ICD-10-CM | POA: Diagnosis present

## 2023-03-31 DIAGNOSIS — M1611 Unilateral primary osteoarthritis, right hip: Secondary | ICD-10-CM | POA: Diagnosis present

## 2023-03-31 DIAGNOSIS — G2581 Restless legs syndrome: Secondary | ICD-10-CM | POA: Diagnosis present

## 2023-03-31 DIAGNOSIS — Z833 Family history of diabetes mellitus: Secondary | ICD-10-CM

## 2023-03-31 DIAGNOSIS — E0590 Thyrotoxicosis, unspecified without thyrotoxic crisis or storm: Secondary | ICD-10-CM | POA: Diagnosis present

## 2023-03-31 DIAGNOSIS — Z79899 Other long term (current) drug therapy: Secondary | ICD-10-CM

## 2023-03-31 LAB — MRSA NEXT GEN BY PCR, NASAL: MRSA by PCR Next Gen: NOT DETECTED

## 2023-03-31 LAB — CBC WITH DIFFERENTIAL/PLATELET
Abs Immature Granulocytes: 0.2 10*3/uL — ABNORMAL HIGH (ref 0.00–0.07)
Basophils Absolute: 0.1 10*3/uL (ref 0.0–0.1)
Basophils Relative: 0 %
Eosinophils Absolute: 0 10*3/uL (ref 0.0–0.5)
Eosinophils Relative: 0 %
HCT: 49 % — ABNORMAL HIGH (ref 36.0–46.0)
Hemoglobin: 15.1 g/dL — ABNORMAL HIGH (ref 12.0–15.0)
Immature Granulocytes: 1 %
Lymphocytes Relative: 11 %
Lymphs Abs: 2.9 10*3/uL (ref 0.7–4.0)
MCH: 30 pg (ref 26.0–34.0)
MCHC: 30.8 g/dL (ref 30.0–36.0)
MCV: 97.2 fL (ref 80.0–100.0)
Monocytes Absolute: 1.1 10*3/uL — ABNORMAL HIGH (ref 0.1–1.0)
Monocytes Relative: 4 %
Neutro Abs: 21.5 10*3/uL — ABNORMAL HIGH (ref 1.7–7.7)
Neutrophils Relative %: 84 %
Platelets: 455 10*3/uL — ABNORMAL HIGH (ref 150–400)
RBC: 5.04 MIL/uL (ref 3.87–5.11)
RDW: 13.3 % (ref 11.5–15.5)
WBC: 25.8 10*3/uL — ABNORMAL HIGH (ref 4.0–10.5)
nRBC: 0 % (ref 0.0–0.2)

## 2023-03-31 LAB — COMPREHENSIVE METABOLIC PANEL
ALT: 32 U/L (ref 0–44)
ALT: 34 U/L (ref 0–44)
AST: 47 U/L — ABNORMAL HIGH (ref 15–41)
AST: 85 U/L — ABNORMAL HIGH (ref 15–41)
Albumin: 4.7 g/dL (ref 3.5–5.0)
Albumin: 3.5 g/dL (ref 3.5–5.0)
Alkaline Phosphatase: 56 U/L (ref 38–126)
Alkaline Phosphatase: 40 U/L (ref 38–126)
Anion gap: 33 — ABNORMAL HIGH (ref 5–15)
Anion gap: 17 — ABNORMAL HIGH (ref 5–15)
BUN: 10 mg/dL (ref 6–20)
BUN: 12 mg/dL (ref 6–20)
CO2: 10 mmol/L — ABNORMAL LOW (ref 22–32)
CO2: 22 mmol/L (ref 22–32)
Calcium: 10.6 mg/dL — ABNORMAL HIGH (ref 8.9–10.3)
Calcium: 8.8 mg/dL — ABNORMAL LOW (ref 8.9–10.3)
Chloride: 100 mmol/L (ref 98–111)
Chloride: 103 mmol/L (ref 98–111)
Creatinine, Ser: 1 mg/dL (ref 0.44–1.00)
Creatinine, Ser: 0.82 mg/dL (ref 0.44–1.00)
GFR, Estimated: >60 mL/min (ref >60)
GFR, Estimated: >60 mL/min (ref >60)
Glucose, Bld: 316 mg/dL — ABNORMAL HIGH (ref 70–99)
Glucose, Bld: 111 mg/dL — ABNORMAL HIGH (ref 70–99)
Potassium: 3.4 mmol/L — ABNORMAL LOW (ref 3.5–5.1)
Potassium: 2.9 mmol/L — ABNORMAL LOW (ref 3.5–5.1)
Sodium: 143 mmol/L (ref 135–145)
Sodium: 142 mmol/L (ref 135–145)
Total Bilirubin: 0.8 mg/dL (ref 0.3–1.2)
Total Bilirubin: 1.1 mg/dL (ref 0.3–1.2)
Total Protein: 8.3 g/dL — ABNORMAL HIGH (ref 6.5–8.1)
Total Protein: 6.5 g/dL (ref 6.5–8.1)

## 2023-03-31 LAB — POCT I-STAT 7, (LYTES, BLD GAS, ICA,H+H)
Acid-base deficit: 4 mmol/L — ABNORMAL HIGH (ref 0.0–2.0)
Bicarbonate: 23.3 mmol/L (ref 20.0–28.0)
Calcium, Ion: 1.21 mmol/L (ref 1.15–1.40)
HCT: 43 % (ref 36.0–46.0)
Hemoglobin: 14.6 g/dL (ref 12.0–15.0)
O2 Saturation: 100 %
Potassium: <2 mmol/L — CL (ref 3.5–5.1)
Sodium: 140 mmol/L (ref 135–145)
TCO2: 25 mmol/L (ref 22–32)
pCO2 arterial: 49 mmHg — ABNORMAL HIGH (ref 32–48)
pH, Arterial: 7.285 — ABNORMAL LOW (ref 7.35–7.45)
pO2, Arterial: 264 mmHg — ABNORMAL HIGH (ref 83–108)

## 2023-03-31 LAB — PROTIME-INR
INR: >10 (ref 0.8–1.2)
INR: 1.1 (ref 0.8–1.2)
Prothrombin Time: >90 seconds — ABNORMAL HIGH (ref 11.4–15.2)
Prothrombin Time: 14.6 seconds (ref 11.4–15.2)

## 2023-03-31 LAB — CSF CELL COUNT WITH DIFFERENTIAL
RBC Count, CSF: 140 /mm3 — ABNORMAL HIGH
RBC Count, CSF: 455 /mm3 — ABNORMAL HIGH
Tube #: 1
Tube #: 4
WBC, CSF: 1 /mm3 (ref 0–5)
WBC, CSF: 2 /mm3 (ref 0–5)

## 2023-03-31 LAB — HEPATIC FUNCTION PANEL
ALT: 30 U/L (ref 0–44)
AST: 86 U/L — ABNORMAL HIGH (ref 15–41)
Albumin: 3.4 g/dL — ABNORMAL LOW (ref 3.5–5.0)
Alkaline Phosphatase: 42 U/L (ref 38–126)
Bilirubin, Direct: 0.1 mg/dL (ref 0.0–0.2)
Indirect Bilirubin: 0.5 mg/dL (ref 0.3–0.9)
Total Bilirubin: 0.6 mg/dL (ref 0.3–1.2)
Total Protein: 6.7 g/dL (ref 6.5–8.1)

## 2023-03-31 LAB — T4, FREE: Free T4: 1.11 ng/dL (ref 0.61–1.12)

## 2023-03-31 LAB — MENINGITIS/ENCEPHALITIS PANEL (CSF)

## 2023-03-31 LAB — PROTEIN AND GLUCOSE, CSF
Glucose, CSF: 132 mg/dL — ABNORMAL HIGH (ref 40–70)
Total  Protein, CSF: 43 mg/dL (ref 15–45)

## 2023-03-31 LAB — APTT: aPTT: 33 seconds (ref 24–36)

## 2023-03-31 LAB — TSH: TSH: 4.711 u[IU]/mL — ABNORMAL HIGH (ref 0.350–4.500)

## 2023-03-31 LAB — MAGNESIUM
Magnesium: 0.8 mg/dL — CL (ref 1.7–2.4)
Magnesium: 2.1 mg/dL (ref 1.7–2.4)

## 2023-03-31 LAB — GLUCOSE, CAPILLARY
Glucose-Capillary: 200 mg/dL — ABNORMAL HIGH (ref 70–99)
Glucose-Capillary: 157 mg/dL — ABNORMAL HIGH (ref 70–99)
Glucose-Capillary: 119 mg/dL — ABNORMAL HIGH (ref 70–99)

## 2023-03-31 LAB — CSF CULTURE W GRAM STAIN: Gram Stain: NONE SEEN

## 2023-03-31 LAB — CK TOTAL AND CKMB (NOT AT ARMC)
CK, MB: 59.2 ng/mL — ABNORMAL HIGH (ref 0.5–5.0)
Total CK: 3472 U/L — ABNORMAL HIGH (ref 38–234)

## 2023-03-31 LAB — CK: Total CK: 2723 U/L — ABNORMAL HIGH (ref 38–234)

## 2023-03-31 LAB — CBG MONITORING, ED: Glucose-Capillary: 304 mg/dL — ABNORMAL HIGH (ref 70–99)

## 2023-03-31 LAB — ETHANOL: Alcohol, Ethyl (B): <10 mg/dL (ref <10)

## 2023-03-31 LAB — BETA-HYDROXYBUTYRIC ACID: Beta-Hydroxybutyric Acid: 0.24 mmol/L (ref 0.05–0.27)

## 2023-03-31 LAB — PHOSPHORUS: Phosphorus: 5.4 mg/dL — ABNORMAL HIGH (ref 2.5–4.6)

## 2023-03-31 MED ORDER — VANCOMYCIN HCL 1750 MG/350ML IV SOLN
1750.0000 mg | Freq: Once | INTRAVENOUS | Status: AC
  Administered 2023-03-31: 1750 mg via INTRAVENOUS
  Filled 2023-03-31: qty 350

## 2023-03-31 MED ORDER — POLYETHYLENE GLYCOL 3350 17 G PO PACK: 17.0000 g | PACK | Freq: Every day | ORAL | Status: DC

## 2023-03-31 MED ORDER — FAMOTIDINE 20 MG PO TABS
20.0000 mg | ORAL_TABLET | Freq: Two times a day (BID) | ORAL | Status: DC
  Administered 2023-03-31: 20 mg
  Filled 2023-03-31: qty 1

## 2023-03-31 MED ORDER — LEVETIRACETAM IN NACL 1000 MG/100ML IV SOLN
1000.0000 mg | Freq: Once | INTRAVENOUS | Status: AC
  Administered 2023-03-31: 1000 mg via INTRAVENOUS

## 2023-03-31 MED ORDER — DEXTROSE 5 % IV SOLN
10.0000 mg/kg | Freq: Three times a day (TID) | INTRAVENOUS | Status: DC
  Administered 2023-03-31: 685 mg via INTRAVENOUS
  Filled 2023-03-31 (×3): qty 13.7

## 2023-03-31 MED ORDER — LORAZEPAM 2 MG/ML IJ SOLN
INTRAMUSCULAR | Status: AC
  Administered 2023-03-31: 2 mg via INTRAVENOUS
  Filled 2023-03-31: qty 1

## 2023-03-31 MED ORDER — VANCOMYCIN HCL IN DEXTROSE 1-5 GM/200ML-% IV SOLN
1000.0000 mg | Freq: Two times a day (BID) | INTRAVENOUS | Status: DC
  Administered 2023-04-01: 1000 mg via INTRAVENOUS
  Filled 2023-03-31: qty 200

## 2023-03-31 MED ORDER — LACTATED RINGERS IV BOLUS
2000.0000 mL | Freq: Once | INTRAVENOUS | Status: AC
  Administered 2023-03-31: 2000 mL via INTRAVENOUS

## 2023-03-31 MED ORDER — MIDAZOLAM HCL 2 MG/2ML IJ SOLN
2.0000 mg | Freq: Once | INTRAMUSCULAR | Status: AC
  Administered 2023-03-31: 2 mg via INTRAVENOUS
  Filled 2023-03-31: qty 2

## 2023-03-31 MED ORDER — MIDAZOLAM HCL 2 MG/2ML IJ SOLN
1.0000 mg | INTRAMUSCULAR | Status: DC | PRN
  Administered 2023-03-31 (×3): 2 mg via INTRAVENOUS
  Filled 2023-03-31 (×2): qty 2

## 2023-03-31 MED ORDER — DOCUSATE SODIUM 50 MG/5ML PO LIQD: 100.0000 mg | Freq: Two times a day (BID) | ORAL | Status: DC

## 2023-03-31 MED ORDER — FENTANYL 2500MCG IN NS 250ML (10MCG/ML) PREMIX INFUSION
50.0000 ug/h | INTRAVENOUS | Status: DC
  Administered 2023-03-31: 50 ug/h via INTRAVENOUS
  Filled 2023-03-31: qty 250

## 2023-03-31 MED ORDER — MAGNESIUM SULFATE 2 GM/50ML IV SOLN
2.0000 g | Freq: Once | INTRAVENOUS | Status: AC
  Administered 2023-03-31: 2 g via INTRAVENOUS
  Filled 2023-03-31: qty 50

## 2023-03-31 MED ORDER — LORAZEPAM 2 MG/ML IJ SOLN: 2.0000 mg | Freq: Once | INTRAMUSCULAR | Status: AC

## 2023-03-31 MED ORDER — POTASSIUM CHLORIDE 10 MEQ/100ML IV SOLN
10.0000 meq | INTRAVENOUS | Status: AC
  Administered 2023-03-31 (×4): 10 meq via INTRAVENOUS

## 2023-03-31 MED ORDER — DEXTROSE 5 % IV SOLN
10.0000 mg/kg | Freq: Three times a day (TID) | INTRAVENOUS | Status: DC
  Administered 2023-04-01: 685 mg via INTRAVENOUS
  Filled 2023-03-31 (×4): qty 13.7

## 2023-03-31 MED ORDER — CHLORHEXIDINE GLUCONATE CLOTH 2 % EX PADS
6.0000 | MEDICATED_PAD | Freq: Every day | CUTANEOUS | Status: DC
  Administered 2023-03-31 – 2023-04-05 (×6): 6 via TOPICAL

## 2023-03-31 MED ORDER — DEXMEDETOMIDINE HCL IN NACL 400 MCG/100ML IV SOLN: 0.0000 ug/kg/h | INTRAVENOUS | Status: DC

## 2023-03-31 MED ORDER — GADOBUTROL 1 MMOL/ML IV SOLN
8.6000 mL | Freq: Once | INTRAVENOUS | Status: AC | PRN
  Administered 2023-03-31: 8.6 mL via INTRAVENOUS

## 2023-03-31 MED ORDER — DEXMEDETOMIDINE HCL IN NACL 400 MCG/100ML IV SOLN
0.0000 ug/kg/h | INTRAVENOUS | Status: DC
  Administered 2023-03-31: 0.4 ug/kg/h via INTRAVENOUS
  Administered 2023-03-31: 0.5 ug/kg/h via INTRAVENOUS
  Filled 2023-03-31 (×2): qty 100

## 2023-03-31 MED ORDER — SODIUM CHLORIDE 0.9 % IV SOLN: 60.0000 mg/kg | Freq: Once | INTRAVENOUS | Status: DC

## 2023-03-31 MED ORDER — DOCUSATE SODIUM 100 MG PO CAPS: 100.0000 mg | ORAL_CAPSULE | Freq: Two times a day (BID) | ORAL | Status: DC | PRN

## 2023-03-31 MED ORDER — SODIUM CHLORIDE 0.9 % IV SOLN: INTRAVENOUS | Status: DC

## 2023-03-31 MED ORDER — SODIUM CHLORIDE 0.9 % IV SOLN
2.0000 g | Freq: Two times a day (BID) | INTRAVENOUS | Status: DC
  Administered 2023-04-01: 2 g via INTRAVENOUS
  Filled 2023-03-31: qty 20

## 2023-03-31 MED ORDER — DOCUSATE SODIUM 50 MG/5ML PO LIQD
100.0000 mg | Freq: Two times a day (BID) | ORAL | Status: DC
  Administered 2023-03-31: 100 mg
  Filled 2023-03-31: qty 10

## 2023-03-31 MED ORDER — ORAL CARE MOUTH RINSE: 15.0000 mL | OROMUCOSAL | Status: DC | PRN

## 2023-03-31 MED ORDER — POTASSIUM CHLORIDE 10 MEQ/100ML IV SOLN
10.0000 meq | INTRAVENOUS | Status: AC
  Filled 2023-03-31 (×4): qty 100

## 2023-03-31 MED ORDER — SODIUM CHLORIDE 0.9 % IV SOLN
2.0000 g | Freq: Two times a day (BID) | INTRAVENOUS | Status: DC
  Administered 2023-03-31: 2 g via INTRAVENOUS
  Filled 2023-03-31: qty 20

## 2023-03-31 MED ORDER — ORAL CARE MOUTH RINSE
15.0000 mL | OROMUCOSAL | Status: DC
  Administered 2023-03-31 – 2023-04-02 (×18): 15 mL via OROMUCOSAL

## 2023-03-31 MED ORDER — INSULIN ASPART 100 UNIT/ML IJ SOLN
0.0000 [IU] | INTRAMUSCULAR | Status: DC
  Administered 2023-03-31 (×2): 4 [IU] via SUBCUTANEOUS
  Administered 2023-04-01 – 2023-04-02 (×3): 3 [IU] via SUBCUTANEOUS

## 2023-03-31 MED ORDER — MIDAZOLAM HCL 2 MG/2ML IJ SOLN
INTRAMUSCULAR | Status: AC
  Filled 2023-03-31: qty 2

## 2023-03-31 MED ORDER — FENTANYL CITRATE PF 50 MCG/ML IJ SOSY
50.0000 ug | PREFILLED_SYRINGE | Freq: Once | INTRAMUSCULAR | Status: AC
  Administered 2023-03-31: 50 ug via INTRAVENOUS
  Filled 2023-03-31: qty 1

## 2023-03-31 MED ORDER — FENTANYL BOLUS VIA INFUSION: 50.0000 ug | INTRAVENOUS | Status: DC | PRN

## 2023-03-31 MED ORDER — LEVETIRACETAM IN NACL 1500 MG/100ML IV SOLN
1500.0000 mg | Freq: Once | INTRAVENOUS | Status: AC
  Administered 2023-03-31: 1500 mg via INTRAVENOUS
  Filled 2023-03-31: qty 100

## 2023-03-31 MED ORDER — POLYETHYLENE GLYCOL 3350 17 G PO PACK: 17.0000 g | PACK | Freq: Every day | ORAL | Status: DC | PRN

## 2023-03-31 NOTE — Progress Notes (Signed)
   03/31/23 2035  Provider Notification  Provider Name/Title e-Link provider  Date Provider Notified 03/31/23  Time Provider Notified 2035  Method of Notification Call  Notification Reason Critical Result  Test performed and critical result PT > 90, INR > 10  Date Critical Result Received 03/31/23  Provider response Other (Comment) (redraw labs)  Date of Provider Response 03/31/23    After these critical results were called to the provider, lab called RN about potential contamination of labs drawn at 1852 when the PT and INR were drawn. RN spoke to e-Link and orders were placed to recollect several labs drawn at that time to ensure accurate results.

## 2023-03-31 NOTE — Procedures (Addendum)
Patient Name: Sandra Miller  MRN: [id number]  Epilepsy Attending: Charlsie Quest  Referring Physician/Provider: Milon Dikes, MD  Date: 03/31/2023 Duration: 30.19 mins  Patient history: 47 yo F patient called daughter and told her she was not feeling well c/o dizziness. Daughter went to check on patient and found her on the floor unresponsive, marking noted to side of head patient may have struck head on dresser. Per EMS patient had a total of 3 seizures, and received a total of 5 mg IV Versed. On arrival patient posturing and eyes deviated to right. EEG to evaluate for seizure.  Level of alertness: lethargic  AEDs during EEG study: LEV, Ativan  Technical aspects: This EEG study was done with scalp electrodes positioned according to the 10-20 International system of electrode placement. Electrical activity was reviewed with band pass filter of 1-70Hz , sensitivity of 7 uV/mm, display speed of 62mm/sec with a 60Hz  notched filter applied as appropriate. EEG data were recorded continuously and digitally stored.  Video monitoring was available and reviewed as appropriate.  Description: No clear posterior dominant rhythm was seen. EEG showed continuous generalized mixed frequencies with predominantly 9-10hz  alpha activity admixed with intermittent generalized 3-6hz  theta-delta slowing as well as 13-15hz  fronto-central beta activity. The amplitude of waves was lower in left temporo-parietal region which could be artifactual due to electrode impedence or underlying structural abnormality. Hyperventilation and photic stimulation were not performed.     Of note, study was technically difficult due to significant electrode and movement artifact.   ABNORMALITY - Continuous slow, generalized  IMPRESSION: This technically difficult study is suggestive of moderate diffuse encephalopathy, nonspecific etiology. No seizures or epileptiform discharges were seen throughout the recording.  The amplitude of  waves was lower in left temporo-parietal region which could be suggestive cortical dysfunction due to underlying structural abnormality. However, difficult to rule out artifact due to the quality of study.   Consider repeat EEG if concern for ictal-interictal activity persists.    Aliegha Paullin Annabelle Harman

## 2023-03-31 NOTE — ED Notes (Signed)
Help get patient on the monitor did EKG shown to Dr Rhunette Croft got patient into a gown patient is resting with nurse at bedside

## 2023-03-31 NOTE — Procedures (Signed)
Lumbar Puncture Procedure Note  Sandra Miller  [id number]  02/28/1976  Date:03/31/23  Time:5:40 PM   Provider Performing:Ezreal Turay   Procedure: Lumbar Puncture ([id number])  Indication(s) Rule out meningitis  Consent Risks of the procedure as well as the alternatives and risks of each were explained to the patient and/or caregiver.  Consent for the procedure was obtained and is signed in the bedside chart  Anesthesia Topical only with 1% lidocaine    Time Out Verified patient identification, verified procedure, site/side was marked, verified correct patient position, special equipment/implants available, medications/allergies/relevant history reviewed, required imaging and test results available.   Sterile Technique Maximal sterile technique including sterile barrier drape, hand hygiene, sterile gown, sterile gloves, mask, hair covering.    Procedure Description Using palpation, approximate location of L3-L4 space identified.   Lidocaine used to anesthetize skin and subcutaneous tissue overlying this area.  A 20g spinal needle was then used to access the subarachnoid space. Opening pressure: 18 cm H2O. Closing pressure:Not obtained. Clear CSF obtained.  Complications/Tolerance None; patient tolerated the procedure well.   EBL Minimal   Specimen(s) CSF

## 2023-03-31 NOTE — Progress Notes (Signed)
Pharmacy Antibiotic Note  Sandra Miller is a 47 y.o. female for which pharmacy has been consulted for acyclovir and vancomycin dosing for meningitis.  Patient with a history of breast and vulvar cancer, anxiety, depression, Graves' disease, hypertension . Patient presenting with seizure.  SCr 1 WBC 25.8; T 96.7; HR 98; RR 32  Plan: Rocephin 2 g q12h Vancomycin 1750 mg once then 1000 mg q12h per nomogram dosing for meningitis Acyclovir 10mg /kg per adj body weight q8h (685 mg q8h) --NS at 125 ml/hr Monitor WBC, fever, renal function, cultures De-escalate when able Levels at steady state  Height: 5\' 5"  (165.1 cm) Weight: 86.2 kg (190 lb) IBW/kg (Calculated) : 57  Temp (24hrs), Avg:96.7 F (35.9 C), Min:96.7 F (35.9 C), Max:96.7 F (35.9 C)  Recent Labs  Lab 03/31/23 1103 03/31/23 1112  WBC 25.8*  --   CREATININE  --  1.00    Estimated Creatinine Clearance: 73.8 mL/min (by C-G formula based on SCr of 1 mg/dL).    Allergies  Allergen Reactions   Dilaudid [Hydromorphone Hcl] Other (See Comments)    Pt became confused, pulled out iv's, does not remember anything   Aspirin Hives    States able to tolerate Goody Powders and Ibuprofen without any problem    Depakote [Divalproex Sodium] Nausea And Vomiting   Minocycline Hives   Microbiology results: Pending  Thank you for allowing pharmacy to be a part of this patient's care.  Delmar Landau, PharmD, BCPS 03/31/2023 3:54 PM ED Clinical Pharmacist -  [phone]

## 2023-03-31 NOTE — ED Provider Notes (Signed)
[place] EMERGENCY DEPARTMENT AT [facility] Provider Note   CSN: [id number] Arrival date & time: 03/31/23  1047     History  Chief Complaint  Patient presents with   Seizures    Armenta C Primeaux is a 47 y.o. female.  HPI    47 year old female comes in with chief complaint of seizure.  History provided by EMS.  According to EMS, daughter called EMS when she found her unresponsive on the floor.  Patient had a blank stare.  She had an episode of seizure that was witnessed by fire team.  EMS team have noticed 2 rounds of seizures.  Patient received IV 5 mg Versed prior to ED arrival.  Patient's daughter provides collateral history later on.  She states that patient has been having nausea and vomiting for the last several days.  This morning she called her indicating that she was feeling dizzy.  Patient was AOx3 at that time.  When she arrived to the house, they found patient on the floor.  Patient had a blank stare.  No previous history of seizures.  No recent illnesses.  History of remote cancer.  Home Medications Prior to Admission medications   Medication Sig Start Date End Date Taking? Authorizing Provider  acetaminophen (TYLENOL) 500 MG tablet Take 1 tablet (500 mg total) by mouth every 6 (six) hours as needed. 01/02/22   Carlisle Beers, FNP  diclofenac Sodium (VOLTAREN) 1 % GEL Apply 4 grams topically 4  times daily as needed. 03/30/21   Hilts, Casimiro Needle, MD  FLUoxetine (PROZAC) 10 MG capsule Take 3 capsules (30 mg total) by mouth daily. 03/02/23   Arfeen, Phillips Grout, MD  Fremanezumab-vfrm (AJOVY) 225 MG/1.5ML SOAJ Inject 225 mg into the skin every 30 (thirty) days. 02/16/23   Anson Fret, MD  gabapentin (NEURONTIN) 300 MG capsule Take 1 capsule (300 mg total) by mouth daily. Pt needs office visit. 09/29/22   Meredith Pel, NP  letrozole [facility]) 2.5 MG tablet Take 1 tablet by mouth daily. 12/01/22   Serena Croissant, MD  letrozole [facility]) 2.5 MG tablet Take 1  tablet (2.5 mg total) by mouth daily. 12/01/22   Serena Croissant, MD  lisinopril (ZESTRIL) 10 MG tablet Take 1 tablet (10 mg total) by mouth daily. Stop the Lisinopril/HCTZ combination 12/08/22   Marcine Matar, MD  loratadine (CLARITIN) 10 MG tablet Take 10 mg by mouth daily.    [provider]  LORazepam (ATIVAN) 1 MG tablet Take 1 tablet (1 mg total) by mouth 3 (three) times daily as needed for anxiety 03/02/23   Arfeen, Phillips Grout, MD  methimazole (TAPAZOLE) 5 MG tablet Take one tablet (5 mg dose) by mouth daily. 09/22/22     methylPREDNISolone (MEDROL DOSEPAK) 4 MG TBPK tablet Take per packet instructions. Taper dosing. 02/16/23   Madelyn Brunner, DO  Multiple Vitamin (MULTIVITAMIN WITH MINERALS) TABS tablet Take 1 tablet by mouth daily.    [provider]  OLANZapine (ZYPREXA) 15 MG tablet Take 1 tablet (15 mg total) by mouth at bedtime. 03/02/23   Arfeen, Phillips Grout, MD  omeprazole (PRILOSEC) 40 MG capsule Take 1 capsule (40 mg total) by mouth in the morning and at bedtime. 09/29/22   Meredith Pel, NP  ondansetron (ZOFRAN-ODT) 8 MG disintegrating tablet Dissolve 1 tablet (8 mg total) by mouth every 8 (eight) hours as needed for nausea or vomiting. Patient not taking: Reported on 03/06/2023 09/14/22   Wallis Bamberg, PA-C  potassium chloride SA (KLOR-CON  M) 20 MEQ tablet Take 1 tablet (20 mEq) by mouth daily. 12/23/22   Marcine Matar, MD  promethazine (PHENERGAN) 25 MG tablet Take 1 tablet (25 mg total) by mouth every 8 (eight) hours as needed for nausea or vomiting. 03/06/23   Mayers, Cari S, PA-C  propranolol (INDERAL) 10 MG tablet Take 1 tablet by mouth 2 (two) times daily. Patient not taking: Reported on 03/06/2023 12/06/21   Marcine Matar, MD  rosuvastatin (CRESTOR) 10 MG tablet Take 1 tablet (10 mg total) by mouth daily. 08/18/22   Marcine Matar, MD  senna-docusate (SENOKOT-S) 8.6-50 MG tablet Take 2 tablets by mouth at bedtime. For AFTER surgery only, do not take if having  diarrhea 02/19/21   Warner Mccreedy D, NP  spironolactone (ALDACTONE) 25 MG tablet Take 0.5 tablets (12.5 mg total) by mouth daily. 03/02/23   Marcine Matar, MD  Ubrogepant (UBRELVY) 100 MG TABS Take 1 tablet (100 mg total) by mouth every 2 (two) hours as needed. Maximum 200mg  a day. 10/03/22   Anson Fret, MD  prochlorperazine (COMPAZINE) 10 MG tablet TAKE 1 TABLET BY MOUTH EVERY 6 HOURS AS NEEDED FOR NAUSEA OR VOMITING Patient taking differently: Take 10 mg by mouth every 6 (six) hours as needed for nausea or vomiting. 06/16/20 08/18/20  Serena Croissant, MD      Allergies    Dilaudid [hydromorphone hcl], Aspirin, Depakote [divalproex sodium], and Minocycline    Review of Systems   Review of Systems  All other systems reviewed and are negative.   Physical Exam Updated Vital Signs BP (!) 125/92 (BP Location: Right Arm)   Pulse 98   Temp (!) 96.7 F (35.9 C) (Axillary)   Resp (!) 32   Ht 5\' 5"  (1.651 m)   SpO2 96%   BMI 31.62 kg/m  Physical Exam Vitals and nursing note reviewed.  Constitutional:      Appearance: She is well-developed.     Comments: obtunded  HENT:     Head:     Comments: Large bruise to the left side of the face Eyes:     Comments: Patient has right-sided gaze deviation, left eye appears more deviated.  Pupils are 3 mm and equal  Cardiovascular:     Rate and Rhythm: Tachycardia present.  Pulmonary:     Effort: Pulmonary effort is normal.  Musculoskeletal:     Cervical back: Normal range of motion and neck supple.  Skin:    General: Skin is warm.  Neurological:     Comments: Patient has upgoing Babinski She also has slight posturing of the lower extremity     ED Results / Procedures / Treatments   Labs (all labs ordered are listed, but only abnormal results are displayed) Labs Reviewed  CBC WITH DIFFERENTIAL/PLATELET - Abnormal; Notable for the following components:      Result Value   WBC 25.8 (*)    Hemoglobin 15.1 (*)    HCT 49.0 (*)     Platelets 455 (*)    Neutro Abs 21.5 (*)    Monocytes Absolute 1.1 (*)    Abs Immature Granulocytes 0.20 (*)    All other components within normal limits  TSH - Abnormal; Notable for the following components:   TSH 4.711 (*)    All other components within normal limits  CBG MONITORING, ED - Abnormal; Notable for the following components:   Glucose-Capillary 304 (*)    All other components within normal limits  ETHANOL  RAPID URINE  DRUG SCREEN, HOSP PERFORMED  PROTIME-INR  T3, FREE  T4, FREE  COMPREHENSIVE METABOLIC PANEL  MAGNESIUM  PHOSPHORUS  HIV ANTIBODY (ROUTINE TESTING W REFLEX)  BLOOD GAS, ARTERIAL  CBG MONITORING, ED  I-STAT CG4 LACTIC ACID, ED    EKG EKG Interpretation Date/Time:  Friday March 31 2023 10:53:40 EDT Ventricular Rate:  149 PR Interval:  170 QRS Duration:  106 QT Interval:  266 QTC Calculation: 419 R Axis:   41  Text Interpretation: Sinus tachycardia LAE, consider biatrial enlargement LVH with secondary repolarization abnormality ST depr, consider ischemia, inferior leads Confirmed by Derwood Kaplan [phone]) on 03/31/2023 12:40:08 PM  Radiology DG Chest Portable 1 View  Result Date: 03/31/2023 CLINICAL DATA:  AMS, Seizures EXAM: PORTABLE CHEST 1 VIEW COMPARISON:  04/11/2020. FINDINGS: Bilateral lung fields are clear. Bilateral costophrenic angles are clear. Normal cardio-mediastinal silhouette. No acute osseous abnormalities. The soft tissues are within normal limits. IMPRESSION: No active disease. Electronically Signed   By: Jules Schick M.D.   On: 03/31/2023 13:04   CT Head Wo Contrast  Result Date: 03/31/2023 CLINICAL DATA:  Provided history: Poly trauma, blunt. Seizure, new onset, no history of trauma. Additional history provided by ER physician: Cancer history. Patient found down with bruising to left cheek. EXAM: CT HEAD WITHOUT CONTRAST CT CERVICAL SPINE WITHOUT CONTRAST TECHNIQUE: Multidetector CT imaging of the head and cervical spine was  performed following the standard protocol without intravenous contrast. Multiplanar CT image reconstructions of the cervical spine were also generated. RADIATION DOSE REDUCTION: This exam was performed according to the departmental dose-optimization program which includes automated exposure control, adjustment of the mA and/or kV according to patient size and/or use of iterative reconstruction technique. COMPARISON:  CT angiogram head 06/25/2022. CT venogram head 06/25/2022. Cervical spine MRI 11/05/2014. FINDINGS: CT HEAD FINDINGS Brain: No age advanced or lobar predominant parenchymal atrophy. Extra-axial hyperdense focus overlying the high left frontal lobe, measuring 1.6 x 0.5 cm (best appreciated on series 5, image 46). There is contact upon the underlying brain parenchyma. No demarcated cortical infarct. No extra-axial fluid collection. No midline shift. Vascular: No hyperdense vessel.  Atherosclerotic calcifications. Skull: No calvarial fracture or aggressive osseous lesion. Sinuses/Orbits: No mass or acute finding within the imaged orbits. 2.1 cm mucous retention cyst within the left maxillary sinus. Mild mucosal thickening scattered within bilateral ethmoid air cells. Other: Small-volume fluid within the right mastoid air cells. Anterior displacement of the mandibular condyles bilaterally. CT CERVICAL SPINE FINDINGS Alignment: Dextrocurvature of the cervical spine. Partially imaged levocurvature of the upper thoracic spine. No significant spondylolisthesis. Skull base and vertebrae: The basion-dental and atlanto-dental intervals are maintained.No evidence of acute fracture to the cervical spine. Soft tissues and spinal canal: No prevertebral fluid or swelling. No visible canal hematoma. Disc levels: Cervical spondylosis with multilevel disc space narrowing, disc bulges/central disc protrusions, posterior disc osteophyte complexes, endplate spurring and uncovertebral hypertrophy. Multilevel spinal canal  stenosis, most notably as follows. At C3-C4, a central disc protrusion contributes to apparent moderate spinal canal stenosis. At C4-C5 and C5-C6, posterior disc osteophyte complexes contribute to at least moderate spinal canal stenosis. Multilevel neural foraminal narrowing. Upper chest: No consolidation within the imaged lung apices. No visible pneumothorax. Centrilobular and paraseptal emphysema. Impression #1 called by telephone at the time of interpretation on 03/31/2023 at 10:35 Am to provider [facility] , who verbally acknowledged these results. IMPRESSION: CT head: 1. 1.6 x 0.5 cm hyperdense extra-axial focus overlying the high left frontal lobe. Given recent trauma, this may reflect  an acute extra-axial hemorrhage. However, a dural-based mass cannot be excluded. This should be followed up with non-contrast head CTs to resolution, or further evaluated with a brain MRI (with and without contrast). 2. Anterior displacement of the mandibular condyles bilaterally. Correlate with physical exam findings to exclude TMJ dislocation. 3. Paranasal sinus disease as described. 4. Small-volume fluid within the right mastoid air cells. CT  cervical spine: 1. No evidence of an acute cervical spine fracture. 2. Mild Dextrocurvature of the cervical spine, possibly positional. 3. Cervical spondylosis as described. 4. Emphysema (ICD10-J43.9). Electronically Signed   By: Jackey Loge D.O.   On: 03/31/2023 12:03   CT Cervical Spine Wo Contrast  Result Date: 03/31/2023 CLINICAL DATA:  Provided history: Poly trauma, blunt. Seizure, new onset, no history of trauma. Additional history provided by ER physician: Cancer history. Patient found down with bruising to left cheek. EXAM: CT HEAD WITHOUT CONTRAST CT CERVICAL SPINE WITHOUT CONTRAST TECHNIQUE: Multidetector CT imaging of the head and cervical spine was performed following the standard protocol without intravenous contrast. Multiplanar CT image reconstructions of the  cervical spine were also generated. RADIATION DOSE REDUCTION: This exam was performed according to the departmental dose-optimization program which includes automated exposure control, adjustment of the mA and/or kV according to patient size and/or use of iterative reconstruction technique. COMPARISON:  CT angiogram head 06/25/2022. CT venogram head 06/25/2022. Cervical spine MRI 11/05/2014. FINDINGS: CT HEAD FINDINGS Brain: No age advanced or lobar predominant parenchymal atrophy. Extra-axial hyperdense focus overlying the high left frontal lobe, measuring 1.6 x 0.5 cm (best appreciated on series 5, image 46). There is contact upon the underlying brain parenchyma. No demarcated cortical infarct. No extra-axial fluid collection. No midline shift. Vascular: No hyperdense vessel.  Atherosclerotic calcifications. Skull: No calvarial fracture or aggressive osseous lesion. Sinuses/Orbits: No mass or acute finding within the imaged orbits. 2.1 cm mucous retention cyst within the left maxillary sinus. Mild mucosal thickening scattered within bilateral ethmoid air cells. Other: Small-volume fluid within the right mastoid air cells. Anterior displacement of the mandibular condyles bilaterally. CT CERVICAL SPINE FINDINGS Alignment: Dextrocurvature of the cervical spine. Partially imaged levocurvature of the upper thoracic spine. No significant spondylolisthesis. Skull base and vertebrae: The basion-dental and atlanto-dental intervals are maintained.No evidence of acute fracture to the cervical spine. Soft tissues and spinal canal: No prevertebral fluid or swelling. No visible canal hematoma. Disc levels: Cervical spondylosis with multilevel disc space narrowing, disc bulges/central disc protrusions, posterior disc osteophyte complexes, endplate spurring and uncovertebral hypertrophy. Multilevel spinal canal stenosis, most notably as follows. At C3-C4, a central disc protrusion contributes to apparent moderate spinal canal  stenosis. At C4-C5 and C5-C6, posterior disc osteophyte complexes contribute to at least moderate spinal canal stenosis. Multilevel neural foraminal narrowing. Upper chest: No consolidation within the imaged lung apices. No visible pneumothorax. Centrilobular and paraseptal emphysema. Impression #1 called by telephone at the time of interpretation on 03/31/2023 at 10:35 Am to provider [facility] , who verbally acknowledged these results. IMPRESSION: CT head: 1. 1.6 x 0.5 cm hyperdense extra-axial focus overlying the high left frontal lobe. Given recent trauma, this may reflect an acute extra-axial hemorrhage. However, a dural-based mass cannot be excluded. This should be followed up with non-contrast head CTs to resolution, or further evaluated with a brain MRI (with and without contrast). 2. Anterior displacement of the mandibular condyles bilaterally. Correlate with physical exam findings to exclude TMJ dislocation. 3. Paranasal sinus disease as described. 4. Small-volume fluid within the right mastoid air cells.  CT  cervical spine: 1. No evidence of an acute cervical spine fracture. 2. Mild Dextrocurvature of the cervical spine, possibly positional. 3. Cervical spondylosis as described. 4. Emphysema (ICD10-J43.9). Electronically Signed   By: Jackey Loge D.O.   On: 03/31/2023 12:03    Procedures .Critical Care  Performed by: Derwood Kaplan, MD Authorized by: Derwood Kaplan, MD   Critical care provider statement:    Critical care time (minutes):  87   Critical care was necessary to treat or prevent imminent or life-threatening deterioration of the following conditions:  CNS failure or compromise and dehydration   Critical care was time spent personally by me on the following activities:  Development of treatment plan with patient or surrogate, discussions with consultants, evaluation of patient's response to treatment, examination of patient, ordering and review of laboratory studies, ordering and  review of radiographic studies, ordering and performing treatments and interventions, pulse oximetry, re-evaluation of patient's condition, review of old charts and obtaining history from patient or surrogate     Medications Ordered in ED Medications  docusate sodium (COLACE) capsule 100 mg (has no administration in time range)  polyethylene glycol (MIRALAX / GLYCOLAX) packet 17 g (has no administration in time range)  famotidine (PEPCID) tablet 20 mg (has no administration in time range)  docusate (COLACE) 50 MG/5ML liquid 100 mg (has no administration in time range)  polyethylene glycol (MIRALAX / GLYCOLAX) packet 17 g (has no administration in time range)  midazolam (VERSED) injection 1-2 mg (has no administration in time range)  dexmedetomidine (PRECEDEX) 400 MCG/100ML (4 mcg/mL) infusion (0.4 mcg/kg/hr  85 kg (Order-Specific) Intravenous New Bag/Given 03/31/23 1421)  lactated ringers bolus 2,000 mL (has no administration in time range)  LORazepam (ATIVAN) injection 2 mg (2 mg Intravenous Given 03/31/23 1110)  levETIRAcetam (KEPPRA) IVPB 1500 mg/ 100 mL premix (0 mg Intravenous Stopped 03/31/23 1140)    Followed by  levETIRAcetam (KEPPRA) IVPB 1000 mg/100 mL premix (0 mg Intravenous Stopped 03/31/23 1219)    Followed by  levETIRAcetam (KEPPRA) IVPB 1000 mg/100 mL premix (0 mg Intravenous Stopped 03/31/23 1204)    Followed by  levETIRAcetam (KEPPRA) IVPB 1000 mg/100 mL premix (0 mg Intravenous Stopped 03/31/23 1149)  midazolam (VERSED) injection 2 mg (2 mg Intravenous Given 03/31/23 1418)    ED Course/ Medical Decision Making/ A&P                             Medical Decision Making Amount and/or Complexity of Data Reviewed Labs: ordered. Radiology: ordered.  Risk Prescription drug management. Decision regarding hospitalization.   This patient presents to the ED with chief complaint(s) of seizures, altered mental status with pertinent past medical history of chronic nausea and  vomiting, remote history of breast and vulvar cancer.The complaint involves an extensive differential diagnosis and also carries with it a high risk of complications and morbidity.    The differential diagnosis includes : Traumatic subarachnoid hemorrhage, traumatic subdural hemorrhage, severe electrolyte abnormality, renal failure, status epilepticus, meningitis, encephalitis, metastatic cancer  The initial plan is to get stat CT head.  Code medical called.  Concerns for brain bleed.  Status epilepticus labs ordered.  Patient will receive Keppra load along with 2 more milligrams of Ativan.  Patient had additional seizure episode in the emergency room, this will be her fourth episode.   Additional history obtained: Additional history obtained from EMS  and daughter. Records reviewed Care Everywhere/External Records  Independent labs interpretation:  The following labs were independently interpreted: Patient's blood sugar is 300.  She has elevated white count of 25.8.  TSH is also elevated slightly.  Patient is still pending CMP.  Lab has been called at 2 PM about pending CMP.  Independent visualization and interpretation of imaging: - I independently visualized the following imaging with scope of interpretation limited to determining acute life threatening conditions related to emergency care: CT scan of the brain, which revealed questionable bleed over the left parietal region.  Treatment and Reassessment: Patient reassessed after CT scan.  She had another episode of seizure.  Neurology consulted.  There is no concerns for active subarachnoid hemorrhage.  Consultation: - Consulted or discussed management/test interpretation with external professional: Discussed case with neurology.  They have ordered stat EEG.  Patient has poor gag, but we are hoping that she will recover soon.  Bed placed at 45 degrees to avoid aspiration.  Neurology team has assessed the patient and ordered stat  EEG.  Neurosurgery consulted for questionable bleed.  Dr. Jackelyn Knife team will see the patient.  MRI brain with and without contrast has already been requested by neurology.   Reassessment at 2 PM.: Neurology team indicated that patient did not have any evidence of seizures on EEG.  No repeat episodes since neurology has been consulted.  Family provided additional history.  Family updated on the results.  I do not think patient can tolerate MRI.  EEG tech indicated that patient was frequently agitated during the EEG.  There is also been airway protection issues with weak gag.  She also has had multiple seizures.  Therefore I do not think it is safe to send patient to MRI without intubation.  CCM has been consulted.  They have seen the patient now and agree with the plan to electively intubate.  Neurology team is also recommended LP.  CCM will complete the LP after the MRI.    Final Clinical Impression(s) / ED Diagnoses Final diagnoses:  Status epilepticus [facility])    Rx / DC Orders ED Discharge Orders     None         Derwood Kaplan, MD 03/31/23 1425

## 2023-03-31 NOTE — Progress Notes (Signed)
Patient arrived to MRI with ED and ICU RNs. Handoff completed for ICU to receive patient post MRI. Dr. Merrily Pew notified of HR 140s and patient very diaphoretic. Pain medication ordered. Will continue to monitor patient throughout MRI.

## 2023-03-31 NOTE — ED Notes (Signed)
Neuro at bedside to intubate patient.   2:10 PM 20 mg Etomidate given IV.   2:11 PM 100 mg Roc given IV.    2:11 PM 7.5 ET tube placed +color change.  23 at the lip.

## 2023-03-31 NOTE — Inpatient Diabetes Management (Addendum)
Inpatient Diabetes Program Recommendations  AACE/ADA: New Consensus Statement on Inpatient Glycemic Control (2015)  Target Ranges:  Prepandial:   less than 140 mg/dL      Peak postprandial:   less than 180 mg/dL (1-2 hours)      Critically ill patients:  140 - 180 mg/dL   Lab Results  Component Value Date   GLUCAP 304 (H) 03/31/2023   HGBA1C 5.8 08/18/2022    Review of Glycemic Control  Diabetes history: No hx Outpatient Diabetes medications: None Current orders for Inpatient glycemic control: Novolog 0-20 Q4H  HgbA1C - 5.8% (08/18/2022)  Inpatient Diabetes Program Recommendations:    IV insulin per [facility] for DKA - new diagnosis DM likely.  To f/u on 7/29.  Thank you. Ailene Ards, RD, LDN, [facility] Inpatient Diabetes Coordinator [phone]

## 2023-03-31 NOTE — ED Triage Notes (Signed)
TRIAGE NOTE:   Patient arrives by EMS from home with c/o seizure.  Per report patient called daughter and told her she was not feeling well c/o dizziness.  Daughter went to check on patient and found her on the floor unresponsive, marking noted to side of head patient may have struck head on dresser.   Per EMS patient had a total of 3 seizures, and received a total of 5 mg IV Versed.    On arrival patient posturing and eyes deviated to right.    EMS VITALS CBG 337 BP160/90 Capnography 25

## 2023-03-31 NOTE — Consult Note (Signed)
Neurology Consultation  Reason for Consult: Seizures, status epilepticus Referring Physician: Dr. Rhunette Croft  CC: Seizures  History is obtained from: Chart, patient's family  HPI: Sandra Miller is a 47 y.o. female past medical history of breast and vulvar cancer, anxiety, depression, Graves' disease, hypertension, presenting to the emergency department after having been found down and witnessed seizures-1 at home and 2 in the emergency room without return to baseline. Daughter reports that the patient has not been feeling well for months-has had persistent nausea and vomiting.  Called her this morning saying that she is not feeling well.  When the patient's daughter got to home, patient was unresponsive on the floor with presumed having hit her head on a dresser.  Total of 3 seizures by EMS.  Received 5 mg IV Versed.  Rightward gaze deviation noted on initial ER examination.  Loaded with Keppra.  Benzo given. Neurology called for stat consultation.  ROS: Unable to ascertain due to current mentation  Past Medical History:  Diagnosis Date   Arthritis    Bipolar 1 disorder (HCC)    Cancer (HCC)    vulva, and breast   Complication of anesthesia    wakes up during procedures   Depression    GAD (generalized anxiety disorder)    Genital HSV    currently per pt  no break out 03-22-2016    GERD (gastroesophageal reflux disease)    Graves disease    Hiatal hernia    History of cervical dysplasia    2012 laser ablation   History of esophageal dilatation    for dysphasia -- x2 dilated   History of gastric ulcer    History of Helicobacter pylori infection    remote hx   History of hidradenitis suppurativa    "gets all over body intermittantly"     History of hypertension    no issue since stopped drinking alcohol 2014   History of kidney stones    History of panic attacks    History of radiation therapy 03/16/2020-05/08/2020   vulva  Dr Antony Blackbird   History of radiation therapy  09/09/2020-10/23/2020   left chest wall/left SCV   Dr Antony Blackbird   Hypertension    Iron deficiency anemia    Left ureteral stone    OCD (obsessive compulsive disorder)    PONV (postoperative nausea and vomiting)    Pre-diabetes    PTSD (post-traumatic stress disorder)    Recovering alcoholic in remission (HCC)    since 2014   RLS (restless legs syndrome)    Smokers' cough (HCC)    Thyroid disease    Urgency of urination    Yeast infection involving the vagina and surrounding area    secondary to taking antibiotic     Family History  Problem Relation Age of Onset   Heart disease Father    Lung cancer Father        d. 73   Alcohol abuse Father    Heart disease Mother    Depression Mother    Anxiety disorder Mother    Drug abuse Brother    Alcohol abuse Brother    Drug abuse Brother    ADD / ADHD Brother    Colon polyps Brother    Cancer Paternal Grandfather        "stomach"   Diabetes Maternal Grandfather    Diabetes Paternal Grandmother    Kidney disease Maternal Uncle    Cirrhosis Cousin        alcoholic  Anxiety disorder Maternal Aunt    Depression Maternal Aunt    Cancer Cousin        maternal; ovarian cancer or other "female" cancer?   Lung cancer Paternal Uncle 65   Throat cancer Cousin        paternal; dx 38s   Lung cancer Cousin        paternal; dx 25s     Social History:   reports that she has been smoking cigarettes. She has a 11 pack-year smoking history. She has never used smokeless tobacco. She reports that she does not currently use alcohol. She reports current drug use. Drug: Marijuana.  Medications  Current Facility-Administered Medications:    [COMPLETED] levETIRAcetam (KEPPRA) IVPB 1500 mg/ 100 mL premix, 1,500 mg, Intravenous, Once, Stopped at 03/31/23 1140 **FOLLOWED BY** levETIRAcetam (KEPPRA) IVPB 1000 mg/100 mL premix, 1,000 mg, Intravenous, Once **FOLLOWED BY** levETIRAcetam (KEPPRA) IVPB 1000 mg/100 mL premix, 1,000 mg, Intravenous,  Once, Last Rate: 400 mL/hr at 03/31/23 1149, 1,000 mg at 03/31/23 1149 **FOLLOWED BY** [COMPLETED] levETIRAcetam (KEPPRA) IVPB 1000 mg/100 mL premix, 1,000 mg, Intravenous, Once, Nanavati, Ankit, MD, Stopped at 03/31/23 1149  Current Outpatient Medications:    acetaminophen (TYLENOL) 500 MG tablet, Take 1 tablet (500 mg total) by mouth every 6 (six) hours as needed., Disp: 30 tablet, Rfl: 0   diclofenac Sodium (VOLTAREN) 1 % GEL, Apply 4 grams topically 4  times daily as needed., Disp: 500 g, Rfl: 6   FLUoxetine (PROZAC) 10 MG capsule, Take 3 capsules (30 mg total) by mouth daily., Disp: 90 capsule, Rfl: 2   Fremanezumab-vfrm (AJOVY) 225 MG/1.5ML SOAJ, Inject 225 mg into the skin every 30 (thirty) days., Disp: 1.5 mL, Rfl: 8   gabapentin (NEURONTIN) 300 MG capsule, Take 1 capsule (300 mg total) by mouth daily. Pt needs office visit., Disp: 30 capsule, Rfl: 5   letrozole (FEMARA) 2.5 MG tablet, Take 1 tablet by mouth daily., Disp: 90 tablet, Rfl: 3   letrozole (FEMARA) 2.5 MG tablet, Take 1 tablet (2.5 mg total) by mouth daily., Disp: 90 tablet, Rfl: 3   lisinopril (ZESTRIL) 10 MG tablet, Take 1 tablet (10 mg total) by mouth daily. Stop the Lisinopril/HCTZ combination, Disp: 90 tablet, Rfl: 1   loratadine (CLARITIN) 10 MG tablet, Take 10 mg by mouth daily., Disp: , Rfl:    LORazepam (ATIVAN) 1 MG tablet, Take 1 tablet (1 mg total) by mouth 3 (three) times daily as needed for anxiety, Disp: 90 tablet, Rfl: 1   methimazole (TAPAZOLE) 5 MG tablet, Take one tablet (5 mg dose) by mouth daily., Disp: 90 tablet, Rfl: 3   methylPREDNISolone (MEDROL DOSEPAK) 4 MG TBPK tablet, Take per packet instructions. Taper dosing., Disp: 21 each, Rfl: 0   Multiple Vitamin (MULTIVITAMIN WITH MINERALS) TABS tablet, Take 1 tablet by mouth daily., Disp: , Rfl:    OLANZapine (ZYPREXA) 15 MG tablet, Take 1 tablet (15 mg total) by mouth at bedtime., Disp: 30 tablet, Rfl: 2   omeprazole (PRILOSEC) 40 MG capsule, Take 1 capsule  (40 mg total) by mouth in the morning and at bedtime., Disp: 60 capsule, Rfl: 5   ondansetron (ZOFRAN-ODT) 8 MG disintegrating tablet, Dissolve 1 tablet (8 mg total) by mouth every 8 (eight) hours as needed for nausea or vomiting. (Patient not taking: Reported on 03/06/2023), Disp: 20 tablet, Rfl: 0   potassium chloride SA (KLOR-CON M) 20 MEQ tablet, Take 1 tablet (20 mEq) by mouth daily., Disp: 90 tablet, Rfl: 1   promethazine (PHENERGAN)  25 MG tablet, Take 1 tablet (25 mg total) by mouth every 8 (eight) hours as needed for nausea or vomiting., Disp: 20 tablet, Rfl: 1   propranolol (INDERAL) 10 MG tablet, Take 1 tablet by mouth 2 (two) times daily. (Patient not taking: Reported on 03/06/2023), Disp: 180 tablet, Rfl: 1   rosuvastatin (CRESTOR) 10 MG tablet, Take 1 tablet (10 mg total) by mouth daily., Disp: 90 tablet, Rfl: 3   senna-docusate (SENOKOT-S) 8.6-50 MG tablet, Take 2 tablets by mouth at bedtime. For AFTER surgery only, do not take if having diarrhea, Disp: 30 tablet, Rfl: 0   spironolactone (ALDACTONE) 25 MG tablet, Take 0.5 tablets (12.5 mg total) by mouth daily., Disp: 60 tablet, Rfl: 1   Ubrogepant (UBRELVY) 100 MG TABS, Take 1 tablet (100 mg total) by mouth every 2 (two) hours as needed. Maximum 200mg  a day., Disp: 16 tablet, Rfl: 11  Exam: Current vital signs: BP (!) 142/97   Pulse (!) 121   Temp (!) 96.7 F (35.9 C) (Axillary)   Resp (!) 30   SpO2 93%  Vital signs in last 24 hours: Temp:  [96.7 F (35.9 C)] 96.7 F (35.9 C) (07/26 1106) Pulse Rate:  [121-130] 121 (07/26 1145) Resp:  [21-30] 30 (07/26 1145) BP: (142-155)/(97-98) 142/97 (07/26 1145) SpO2:  [93 %] 93 % (07/26 1145)  General: Obtunded HEENT: Normocephalic atraumatic Lungs: Clear Cardiovascular: Regular rhythm Abdomen nondistended nontender Neurologic exam She is obtunded To noxious stimulation by sternal rub, attempts to open eyes and attempt to localize Cranial nerves: Pupils equal round react light, gaze  is midline, does not been to threat from either side, face appears symmetric, neck in collar. Motor examination: Initially appeared that she is stronger on the right but she is moving both sides symmetrically to noxious stimulation and then spontaneously. Sensation: As above  Labs I have reviewed labs in epic and the results pertinent to this consultation are: CBC    Component Value Date/Time   WBC 25.8 (H) 03/31/2023 1103   RBC 5.04 03/31/2023 1103   HGB 15.1 (H) 03/31/2023 1103   HGB 12.9 11/22/2022 1633   HCT 49.0 (H) 03/31/2023 1103   HCT 38.6 11/22/2022 1633   PLT 455 (H) 03/31/2023 1103   PLT 318 11/22/2022 1633   MCV 97.2 03/31/2023 1103   MCV 89 11/22/2022 1633   MCH 30.0 03/31/2023 1103   MCHC 30.8 03/31/2023 1103   RDW 13.3 03/31/2023 1103   RDW 13.6 11/22/2022 1633   LYMPHSABS PENDING 03/31/2023 1103   LYMPHSABS 3.9 (H) 11/22/2022 1633   MONOABS PENDING 03/31/2023 1103   EOSABS PENDING 03/31/2023 1103   EOSABS 0.1 11/22/2022 1633   BASOSABS PENDING 03/31/2023 1103   BASOSABS 0.0 11/22/2022 1633    CMP     Component Value Date/Time   NA 144 03/16/2023 0953   K 3.7 03/16/2023 0953   CL 103 03/16/2023 0953   CO2 24 03/16/2023 0953   GLUCOSE 83 03/16/2023 0953   GLUCOSE 106 (H) 01/25/2023 0956   BUN 5 (L) 03/16/2023 0953   CREATININE 0.55 (L) 03/16/2023 0953   CREATININE 0.75 04/06/2021 1112   CREATININE 1.01 11/10/2016 1012   CALCIUM 10.1 03/16/2023 0953   CALCIUM 9.8 10/20/2020 1127   PROT 7.4 01/25/2023 0956   PROT 7.2 08/18/2022 1605   ALBUMIN 4.0 01/25/2023 0956   ALBUMIN 4.6 08/18/2022 1605   AST 33 01/25/2023 0956   AST 32 04/06/2021 1112   ALT 15 01/25/2023 0956   ALT  27 04/06/2021 1112   ALKPHOS 44 01/25/2023 0956   BILITOT 1.3 (H) 01/25/2023 0956   BILITOT 0.3 08/18/2022 1605   BILITOT 0.5 04/06/2021 1112   GFRNONAA >60 01/25/2023 0956   GFRNONAA >60 04/06/2021 1112   GFRNONAA 69 11/10/2016 1012   GFRAA >60 06/04/2020 1408   GFRAA 79  11/10/2016 1012   Imaging I have reviewed the images obtained:  CT head with a 1.6 x 0.5 cm hyperdense extra-axial focus overlying the high left frontal lobe-given recent trauma question acute hemorrhage.  However dural based mass cannot be excluded.  Follow-up with MRI.  Assessment: 47 year old with above past medical history presenting for multiple seizures at home and multiple witnessed seizures in the ED. On examination, waking up now after loaded with Keppra and receiving Ativan. Seizure and status epilepticus based on history and description-needs workup for etiology. Due to fall, bleed as seen in the left frontal area may be possible but it appears more artifactual than bleed-MRI will help.  Impression: Seizure, status epilepticus  Recommendations:  Agree with the load of Keppra Continue Keppra 500 twice daily MRI brain with and without contrast Stat EEG Decision on LTM based on stat EEG results Check U tox, UA, chest x-ray   Addendum Stat EEG negative for seizures. No need for LTM for now Next step is to get MRI of the brain with and without contrast stat given history of malignancy. If that is unremarkable, will need a spinal tap unless a clear source of infection is identified since she has a leukocytosis to greater than 25,000. PCCM taking over care from ER.  Discussed with PCCM attending.  D/W Dr. Rhunette Croft and Dr. Merrily Pew  -- Milon Dikes, MD Neurologist [facility] Pager: [phone]   CRITICAL CARE ATTESTATION Performed by: Milon Dikes, MD Total critical care time: 40 minutes Critical care time was exclusive of separately billable procedures and treating other patients and/or supervising APPs/Residents/Students Critical care was necessary to treat or prevent imminent or life-threatening deterioration. This patient is critically ill and at significant risk for neurological worsening and/or death and care requires constant monitoring. Critical care  was time spent personally by me on the following activities: development of treatment plan with patient and/or surrogate as well as nursing, discussions with consultants, evaluation of patient's response to treatment, examination of patient, obtaining history from patient or surrogate, ordering and performing treatments and interventions, ordering and review of laboratory studies, ordering and review of radiographic studies, pulse oximetry, re-evaluation of patient's condition, participation in multidisciplinary rounds and medical decision making of high complexity in the care of this patient.

## 2023-03-31 NOTE — Progress Notes (Signed)
I reviewed the MRI with the neuroradiologist.  He does feel that the CT looked somewhat convincing for the bleed but due to the thickness and technical variability on the MRI, it is not very convincing on the MRI although there might be some susceptibility artifact. Plan is to repeat CT head tomorrow.

## 2023-03-31 NOTE — Procedures (Signed)
Intubation Procedure Note  Sandra Miller  [id number]  Feb 17, 1976  Date:03/31/23  Time:4:25 PM   Provider Performing:Darchelle Nunes    Procedure: Intubation (31500)  Indication(s) Respiratory Failure  Consent Risks of the procedure as well as the alternatives and risks of each were explained to the patient and/or caregiver.  Consent for the procedure was obtained and is signed in the bedside chart   Anesthesia Etomidate and Rocuronium   Time Out Verified patient identification, verified procedure, site/side was marked, verified correct patient position, special equipment/implants available, medications/allergies/relevant history reviewed, required imaging and test results available.   Sterile Technique Usual hand hygeine, masks, and gloves were used   Procedure Description Patient positioned in bed supine.  Sedation given as noted above.  Patient was intubated with endotracheal tube using  MAC 4 .  View was Grade 1 full glottis .  Number of attempts was 1.  Colorimetric CO2 detector was consistent with tracheal placement.   Complications/Tolerance None; patient tolerated the procedure well. Chest X-ray is ordered to verify placement.   EBL Minimal   Specimen(s) None

## 2023-03-31 NOTE — Progress Notes (Signed)
Patient ID: [name], female   DOB: [birth year], 47 y.o.   MRN: [id number] BP (!) 125/92 (BP Location: Right Arm)   Pulse 98   Temp (!) 96.7 F (35.9 C) (Axillary)   Resp (!) 32   Ht 5\' 5"  (1.651 m)   SpO2 96%   BMI 31.62 kg/m  I have reviewed the films, there is no operative lesion, mass, or hematoma on this CT. This scan shows no shift, no ventricular effacement. The basal cisterns are widely patent. A repeat scan should be done tomorrow, but if still in status epilepticus it is not critical to obtain the scan. The lesion is not causing the seizures.

## 2023-03-31 NOTE — ED Notes (Signed)
EEG at bedside.

## 2023-03-31 NOTE — ED Notes (Signed)
Patient transported to CT accompanied by nurse.     11.25 AM CT completed, patient again stiff and posturing and grunting.

## 2023-03-31 NOTE — Progress Notes (Signed)
LTM EEG hooked up and running - no initial skin breakdown - push button tested - [facility] monitoring.  

## 2023-03-31 NOTE — H&P (Addendum)
Sandra Miller, MRN:  [id number], DOB:  1976/03/06, LOS: 0 ADMISSION DATE:  03/31/2023, CONSULTATION DATE:  03/31/23 REFERRING MD:  EDP, CHIEF COMPLAINT:  seizure   History of Present Illness:  HPI obtained from pt's daughter, Melton Alar and pt's sister, Misty Stanley at bedside as patient remains encephalopathic.    47 yr old female with PMH as below, who presented to ER by EMS after being found minimally responsive on the floor by her daughter this morning after 915am.  Daughter reports patient has had intermittent nausea and vomiting being treated with phenergan for months but worse over the last week with poor PO intake.  No reported weight loss.  Also c/o of bone pain for one month and worsening of her chronic headaches/ migraines, treating with tylenol as pt avoids narcotics.  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  No previous history of seizures    Pt called her daughter asking her to come over this morning, didn't feel well.  Daughter found her on the floor less than 30 mins later.  Witnessed seizure, then 2 additional seizures with EMS.  In ER, had rightward gaze and remained minimally responsive with additional two seizures in ER.  Neurology consulted emergently.  CTH showed 1.6 x 0.5 cm hyperdense extra-axial focus over left frontal lobe concerning for either SDH with fall vs mass, cervical CT neg for acute fracture.  Loaded with keppra and treated with ativan.  Labs significant for WBC 25.8, K 3.4, sCr 1, Mag 0.8, phos 5.4, calcium 10.6, AST 47, TSH 4.7, glucose 316, AG 33.  Spot EEG pending.  Pt has remained poorly responsive, but starting to wake up.  MRI and LP pending. PCCM called for admit.   Pertinent  Medical History  Tobacco abuse, Breast and vulvar cancer (01/2020 s/p chemo/ radiation/ hysterectomy maintained on letrozole), HTN, IDA,  Graves disease, anxiety/ bipolar, hiatal hernia, former ETOH abuse, TMJ/ lock  jaw  Significant Hospital Events: Including procedures, antibiotic start and stop dates in addition to other pertinent events   7/26 presented to the ED with concern for status, intubated   Interim History / Subjective:  Intubated uneventfully   Objective   Blood pressure (!) 125/92, pulse 98, temperature (!) 96.7 F (35.9 C), temperature source Axillary, resp. rate (!) 32, height 5\' 5"  (1.651 m), SpO2 96%.       No intake or output data in the 24 hours ending 03/31/23 1422 There were no vitals filed for this visit.  General:  well-nourished F, minimally responsive in no acute distress HEENT: MM pink/moist, C-collar in place, sclera anicteric, linear abrasion to the L side of the face  Neuro: initially minimally responsive, then started to open eyes to voice and follow commands weakly  CV: s1s2 rrr, no m/r/g PULM:  Clear bilaterally on [place] without rhonchi or wheezing GI: soft, non-distended  Extremities: warm/dry, no edema  Skin: no rashes or lesions    Resolved Hospital Problem list     Assessment & Plan:    New onset seizure activity with fall In the setting of history of  Vulvar and breast Ca Acute Encephalopathy with respiratory depression Three witnessed episodes without return to baseline raises the concern for status though EEG in the ED without seizure activity CTH shows 1.6 x 0.5 cm hyperdense extra-axial focus overlying the high left frontal lobe which could represent acute subdural vs dural mass -admit to ICU -intubated for airway  protection  -MRI brain stat -concern for meningitis, LP after MRI, start empiric acyclovir, vanc and ceftriaxone  -appreciate neurology recs, loaded with Keppra and continue 500mg  bid with sz precautions  -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 Breast Ca (stage II) also dx'd 2021 and treated with chemo and radiation through 2022.  Underwent total  hysterectomy, BSO in 2022 and done well since then -precedex for PAD protocol  --Maintain full vent support with SAT/SBT as tolerated -titrate Vent setting to maintain SpO2 greater than or equal to 90%. -HOB elevated 30 degrees. -Plateau pressures less than 30 cm H20.  -Follow chest x-ray, ABG prn.   -Bronchial hygiene and RT/bronchodilator protocol.   Leukocytosis  No recent fevers or chills, has had recent n/v/HA -check LP -blood and urine cultures -lactic acid and CK -no abdominal pain on exam, hold off imaging for now   Hypomagnesemia  Hypokalemia ? From vomiting, Mag level 0.8 -given Mag 2g iv, give additional 2g now and recheck level this evening  -K   AGMA Hyperglycemia  Not a known diabetic, possibly 2/2 new onset DKA, lactic acidosis -check ABG and BHOB -A1c and SSI for now, may need insulin infusion  -IVF bolus 2L slowly     Best Practice (right click and "Reselect all SmartList Selections" daily)   Diet/type: NPO DVT prophylaxis: SCD GI prophylaxis: H2B Lines: N/A Foley:  N/A Code Status:  full code Last date of multidisciplinary goals of care discussion [pending, family updated at the bedside]  Labs   CBC: Recent Labs  Lab 03/31/23 1103  WBC 25.8*  NEUTROABS 21.5*  HGB 15.1*  HCT 49.0*  MCV 97.2  PLT 455*    Basic Metabolic Panel: No results for input(s): "NA", "K", "CL", "CO2", "GLUCOSE", "BUN", "CREATININE", "CALCIUM", "MG", "PHOS" in the last 168 hours. GFR: CrCl cannot be calculated (Unknown ideal weight.). Recent Labs  Lab 03/31/23 1103  WBC 25.8*    Liver Function Tests: No results for input(s): "AST", "ALT", "ALKPHOS", "BILITOT", "PROT", "ALBUMIN" in the last 168 hours. No results for input(s): "LIPASE", "AMYLASE" in the last 168 hours. No results for input(s): "AMMONIA" in the last 168 hours.  ABG    Component Value Date/Time   TCO2 18 02/26/2015 1444     Coagulation Profile: No results for input(s): "INR",  "PROTIME" in the last 168 hours.  Cardiac Enzymes: No results for input(s): "CKTOTAL", "CKMB", "CKMBINDEX", "TROPONINI" in the last 168 hours.  HbA1C: HbA1c, POC (prediabetic range)  Date/Time Value Ref Range Status  08/06/2021 02:22 PM 5.7 5.7 - 6.4 % Final   HbA1c, POC (controlled diabetic range)  Date/Time Value Ref Range Status  08/18/2022 03:11 PM 5.8 0.0 - 7.0 % Final   Hgb A1c MFr Bld  Date/Time Value Ref Range Status  12/06/2021 09:25 AM 5.9 (H) 4.8 - 5.6 % Final    Comment:             Prediabetes: 5.7 - 6.4          Diabetes: >6.4          Glycemic control for adults with diabetes: <7.0   05/06/2021 08:46 AM 6.1 (H) 4.8 - 5.6 % Final    Comment:    (NOTE) Pre diabetes:          5.7%-6.4%  Diabetes:              >6.4%  Glycemic control for   <  7.0% adults with diabetes     CBG: Recent Labs  Lab 03/31/23 1054  GLUCAP 304*    Review of Systems:   Unable to obtain secondary to encephalopathy  Past Medical History:  She,  has a past medical history of Arthritis, Bipolar 1 disorder (HCC), Cancer (HCC), Complication of anesthesia, Depression, GAD (generalized anxiety disorder), Genital HSV, GERD (gastroesophageal reflux disease), Graves disease, Hiatal hernia, History of cervical dysplasia, History of esophageal dilatation, History of gastric ulcer, History of Helicobacter pylori infection, History of hidradenitis suppurativa, History of hypertension, History of kidney stones, History of panic attacks, History of radiation therapy (03/16/2020-05/08/2020), History of radiation therapy (09/09/2020-10/23/2020), Hypertension, Iron deficiency anemia, Left ureteral stone, OCD (obsessive compulsive disorder), PONV (postoperative nausea and vomiting), Pre-diabetes, PTSD (post-traumatic stress disorder), Recovering alcoholic in remission (HCC), RLS (restless legs syndrome), Smokers' cough (HCC), Thyroid disease, Urgency of urination, and Yeast infection involving the vagina and  surrounding area.   Surgical History:   Past Surgical History:  Procedure Laterality Date   CESAREAN SECTION  1995   w/  Bilateral Tubal Ligation   COLONOSCOPY  last one 08-09-2013   CYSTOSCOPY W/ URETERAL STENT PLACEMENT Left 03/29/2016   Procedure: CYSTOSCOPY WITH STENT REPLACEMENT;  Surgeon: Hildred Laser, MD;  Location: [facility];  Service: Urology;  Laterality: Left;   CYSTOSCOPY WITH RETROGRADE PYELOGRAM, URETEROSCOPY AND STENT PLACEMENT Left 03/08/2016   Procedure: CYSTOSCOPY WITH  LEFT RETROGRADE PYELOGRAM, AND STENT PLACEMENT;  Surgeon: Hildred Laser, MD;  Location: [facility];  Service: Urology;  Laterality: Left;   CYSTOSCOPY/RETROGRADE/URETEROSCOPY/STONE EXTRACTION WITH BASKET Left 03/29/2016   Procedure: CYSTOSCOPY/RETROGRADE/URETEROSCOPY/STONE EXTRACTION WITH BASKET;  Surgeon: Hildred Laser, MD;  Location: [facility];  Service: Urology;  Laterality: Left;   ENDOMETRIAL ABLATION W/ NOVASURE  04-01-2010   ESOPHAGOGASTRODUODENOSCOPY  last one 08-09-2013   KNEE ARTHROSCOPY Left as teen   LASER ABLATION OF THE CERVIX  2012 approx   MASTECTOMY WITH AXILLARY LYMPH NODE DISSECTION Left 07/27/2020   Procedure: LEFT MASTECTOMY WITH LEFT RADIOACTIVE SEED GUIDED TARGETED AXILLARY LYMPH NODE DISSECTION;  Surgeon: Ovidio Kin, MD;  Location: [facility];  Service: General;  Laterality: Left;   PORT-A-CATH REMOVAL Right 05/18/2021   Procedure: REMOVAL PORT-A-CATH;  Surgeon: Manus Rudd, MD;  Location: [facility];  Service: General;  Laterality: Right;   PORTACATH PLACEMENT Right 03/06/2020   Procedure: INSERTION PORT-A-CATH WITH ULTRASOUND GUIDANCE;  Surgeon: Ovidio Kin, MD;  Location: [facility];  Service: General;  Laterality: Right;   ROBOTIC ASSISTED TOTAL HYSTERECTOMY WITH BILATERAL SALPINGO OOPHERECTOMY N/A 05/18/2021   Procedure: XI ROBOTIC ASSISTED TOTAL HYSTERECTOMY WITH BILATERAL SALPINGO OOPHORECTOMY;  Surgeon: Adolphus Birchwood, MD;   Location: [facility];  Service: Gynecology;  Laterality: N/A;   TRANSTHORACIC ECHOCARDIOGRAM  05-19-2006   lvsf normal, ef 55-65%, there was mild flattening of the interventricular septum during diastoli/  RV size at upper limits normal   TUBAL LIGATION     VULVA /PERINEUM BIOPSY N/A 05/18/2021   Procedure: VULVAR BIOPSY;  Surgeon: Adolphus Birchwood, MD;  Location: [facility];  Service: Gynecology;  Laterality: N/A;   WISDOM TOOTH EXTRACTION  age 5 's     Social History:   reports that she has been smoking cigarettes. She has a 11 pack-year smoking history. She has never used smokeless tobacco. She reports that she does not currently use alcohol. She reports current drug use. Drug: Marijuana.   Family History:  Her family history includes ADD / ADHD in her brother; Alcohol  abuse in her brother and father; Anxiety disorder in her maternal aunt and mother; Cancer in her cousin and paternal grandfather; Cirrhosis in her cousin; Colon polyps in her brother; Depression in her maternal aunt and mother; Diabetes in her maternal grandfather and paternal grandmother; Drug abuse in her brother and brother; Heart disease in her father and mother; Kidney disease in her maternal uncle; Lung cancer in her cousin and father; Lung cancer (age of onset: 92) in her paternal uncle; Throat cancer in her cousin.   Allergies Allergies  Allergen Reactions   Dilaudid [Hydromorphone Hcl] Other (See Comments)    Pt became confused, pulled out iv's, does not remember anything   Aspirin Hives    States able to tolerate Goody Powders and Ibuprofen without any problem    Depakote [Divalproex Sodium] Nausea And Vomiting   Minocycline Hives     Home Medications  Prior to Admission medications   Medication Sig Start Date End Date Taking? Authorizing Provider  acetaminophen (TYLENOL) 500 MG tablet Take 1 tablet (500 mg total) by mouth every 6 (six) hours as needed. 01/02/22   Carlisle Beers, FNP  diclofenac Sodium (VOLTAREN) 1  % GEL Apply 4 grams topically 4  times daily as needed. 03/30/21   Hilts, Casimiro Needle, MD  FLUoxetine (PROZAC) 10 MG capsule Take 3 capsules (30 mg total) by mouth daily. 03/02/23   Arfeen, Phillips Grout, MD  Fremanezumab-vfrm (AJOVY) 225 MG/1.5ML SOAJ Inject 225 mg into the skin every 30 (thirty) days. 02/16/23   Anson Fret, MD  gabapentin (NEURONTIN) 300 MG capsule Take 1 capsule (300 mg total) by mouth daily. Pt needs office visit. 09/29/22   Meredith Pel, NP  letrozole [facility]) 2.5 MG tablet Take 1 tablet by mouth daily. 12/01/22   Serena Croissant, MD  letrozole [facility]) 2.5 MG tablet Take 1 tablet (2.5 mg total) by mouth daily. 12/01/22   Serena Croissant, MD  lisinopril (ZESTRIL) 10 MG tablet Take 1 tablet (10 mg total) by mouth daily. Stop the Lisinopril/HCTZ combination 12/08/22   Marcine Matar, MD  loratadine (CLARITIN) 10 MG tablet Take 10 mg by mouth daily.    [provider]  LORazepam (ATIVAN) 1 MG tablet Take 1 tablet (1 mg total) by mouth 3 (three) times daily as needed for anxiety 03/02/23   Arfeen, Phillips Grout, MD  methimazole (TAPAZOLE) 5 MG tablet Take one tablet (5 mg dose) by mouth daily. 09/22/22     methylPREDNISolone (MEDROL DOSEPAK) 4 MG TBPK tablet Take per packet instructions. Taper dosing. 02/16/23   Madelyn Brunner, DO  Multiple Vitamin (MULTIVITAMIN WITH MINERALS) TABS tablet Take 1 tablet by mouth daily.    [provider]  OLANZapine (ZYPREXA) 15 MG tablet Take 1 tablet (15 mg total) by mouth at bedtime. 03/02/23   Arfeen, Phillips Grout, MD  omeprazole (PRILOSEC) 40 MG capsule Take 1 capsule (40 mg total) by mouth in the morning and at bedtime. 09/29/22   Meredith Pel, NP  ondansetron (ZOFRAN-ODT) 8 MG disintegrating tablet Dissolve 1 tablet (8 mg total) by mouth every 8 (eight) hours as needed for nausea or vomiting. Patient not taking: Reported on 03/06/2023 09/14/22   Wallis Bamberg, PA-C  potassium chloride SA (KLOR-CON M) 20 MEQ tablet Take 1 tablet (20 mEq) by mouth daily.  12/23/22   Marcine Matar, MD  promethazine (PHENERGAN) 25 MG tablet Take 1 tablet (25 mg total) by mouth every 8 (eight) hours as needed for nausea or vomiting. 03/06/23  Mayers, Cari S, PA-C  propranolol (INDERAL) 10 MG tablet Take 1 tablet by mouth 2 (two) times daily. Patient not taking: Reported on 03/06/2023 12/06/21   Marcine Matar, MD  rosuvastatin (CRESTOR) 10 MG tablet Take 1 tablet (10 mg total) by mouth daily. 08/18/22   Marcine Matar, MD  senna-docusate (SENOKOT-S) 8.6-50 MG tablet Take 2 tablets by mouth at bedtime. For AFTER surgery only, do not take if having diarrhea 02/19/21   Warner Mccreedy D, NP  spironolactone (ALDACTONE) 25 MG tablet Take 0.5 tablets (12.5 mg total) by mouth daily. 03/02/23   Marcine Matar, MD  Ubrogepant (UBRELVY) 100 MG TABS Take 1 tablet (100 mg total) by mouth every 2 (two) hours as needed. Maximum 200mg  a day. 10/03/22   Anson Fret, MD  prochlorperazine (COMPAZINE) 10 MG tablet TAKE 1 TABLET BY MOUTH EVERY 6 HOURS AS NEEDED FOR NAUSEA OR VOMITING Patient taking differently: Take 10 mg by mouth every 6 (six) hours as needed for nausea or vomiting. 06/16/20 08/18/20  Serena Croissant, MD     Critical care time: 45 minutes      CRITICAL CARE Performed by: Darcella Gasman Warden Buffa   Total critical care time: 45 minutes  Critical care time was exclusive of separately billable procedures and treating other patients.  Critical care was necessary to treat or prevent imminent or life-threatening deterioration.  Critical care was time spent personally by me on the following activities: development of treatment plan with patient and/or surrogate as well as nursing, discussions with consultants, evaluation of patient's response to treatment, examination of patient, obtaining history from patient or surrogate, ordering and performing treatments and interventions, ordering and review of laboratory studies, ordering and review of radiographic studies, pulse  oximetry and re-evaluation of patient's condition.  Darcella Gasman Phenix Vandermeulen, PA-C Between Pulmonary & Critical care See Amion for pager If no response to pager , please call [phone] until 7pm After 7:00 pm call Elink  [phone]

## 2023-03-31 NOTE — Progress Notes (Addendum)
eLink Physician-Brief Progress Note Patient Name: Sandra Miller DOB: Dec 10, 1975 MRN: [id number]   Date of Service  03/31/2023  HPI/Events of Note  Elevated PT, INR > 10. Discussed with bed side RN Chart and  PTA meds reviewed.  47 year old female with history of breast and valvular cancer s/p chemoradiation and hysterectomy, hypertension who presented after she was found minimally responsive at home, apparently had fall, noted to have multiple seizures, in the emergency department she was found postictal, she was intubated for airway protection. Seen by Dr Franky Macho- no ICB on CT.   Camera: On EEG. VS stable. On lung protective ventilation  Data; Could not see any lab abnormality or meds that should cause INR > 10.   eICU Interventions  Will repeat PT/PTT/INR stat again. To call back if abnormal. If still elevated, would give FFP.         Ranee Gosselin 03/31/2023, 8:50 PM  23:23 Discussed with RN. Repeat INR is normal Hypokalemia( 3/4 run of potassium) on replacement, cr normal. Increasing CK. On fluids at 125. On 40% fio2.   - increase fluids to 150 ml/hr  - follow CK in AM -get VBG - I and out cath for retension.  01:04 requesting restraints, pt reaching for ET tube ordered

## 2023-03-31 NOTE — Progress Notes (Signed)
EEG complete - results pending 

## 2023-04-01 ENCOUNTER — Inpatient Hospital Stay (HOSPITAL_COMMUNITY): Payer: PRIVATE HEALTH INSURANCE

## 2023-04-01 DIAGNOSIS — G40901 Epilepsy, unspecified, not intractable, with status epilepticus: Secondary | ICD-10-CM | POA: Diagnosis not present

## 2023-04-01 DIAGNOSIS — R569 Unspecified convulsions: Secondary | ICD-10-CM | POA: Diagnosis not present

## 2023-04-01 DIAGNOSIS — R0689 Other abnormalities of breathing: Secondary | ICD-10-CM | POA: Diagnosis not present

## 2023-04-01 DIAGNOSIS — G9340 Encephalopathy, unspecified: Secondary | ICD-10-CM | POA: Diagnosis not present

## 2023-04-01 LAB — CBC
HCT: 32.5 % — ABNORMAL LOW (ref 36.0–46.0)
Hemoglobin: 11.1 g/dL — ABNORMAL LOW (ref 12.0–15.0)
MCH: 30.4 pg (ref 26.0–34.0)
MCHC: 34.2 g/dL (ref 30.0–36.0)
MCV: 89 fL (ref 80.0–100.0)
Platelets: 249 10*3/uL (ref 150–400)
RBC: 3.65 MIL/uL — ABNORMAL LOW (ref 3.87–5.11)
RDW: 13.4 % (ref 11.5–15.5)
WBC: 17.6 10*3/uL — ABNORMAL HIGH (ref 4.0–10.5)
nRBC: 0 % (ref 0.0–0.2)

## 2023-04-01 LAB — POCT I-STAT 7, (LYTES, BLD GAS, ICA,H+H)
Acid-base deficit: 3 mmol/L — ABNORMAL HIGH (ref 0.0–2.0)
Bicarbonate: 20.3 mmol/L (ref 20.0–28.0)
Calcium, Ion: 1.12 mmol/L — ABNORMAL LOW (ref 1.15–1.40)
HCT: 34 % — ABNORMAL LOW (ref 36.0–46.0)
Hemoglobin: 11.6 g/dL — ABNORMAL LOW (ref 12.0–15.0)
O2 Saturation: 98 %
Patient temperature: 98.7
Potassium: 2.7 mmol/L — CL (ref 3.5–5.1)
Sodium: 141 mmol/L (ref 135–145)
TCO2: 21 mmol/L — ABNORMAL LOW (ref 22–32)
pCO2 arterial: 30.1 mmHg — ABNORMAL LOW (ref 32–48)
pH, Arterial: 7.437 (ref 7.35–7.45)
pO2, Arterial: 103 mmHg (ref 83–108)

## 2023-04-01 LAB — BLOOD GAS, VENOUS
Acid-base deficit: 1.7 mmol/L (ref 0.0–2.0)
Bicarbonate: 23 mmol/L (ref 20.0–28.0)
Drawn by: 68774
O2 Saturation: 100 %
Patient temperature: 37.2
pCO2, Ven: 38 mmHg — ABNORMAL LOW (ref 44–60)
pH, Ven: 7.39 (ref 7.25–7.43)
pO2, Ven: 100 mmHg — ABNORMAL HIGH (ref 32–45)

## 2023-04-01 LAB — GLUCOSE, CAPILLARY
Glucose-Capillary: 105 mg/dL — ABNORMAL HIGH (ref 70–99)
Glucose-Capillary: 128 mg/dL — ABNORMAL HIGH (ref 70–99)
Glucose-Capillary: 130 mg/dL — ABNORMAL HIGH (ref 70–99)
Glucose-Capillary: 144 mg/dL — ABNORMAL HIGH (ref 70–99)
Glucose-Capillary: 89 mg/dL (ref 70–99)
Glucose-Capillary: 98 mg/dL (ref 70–99)

## 2023-04-01 LAB — CK: Total CK: 3951 U/L — ABNORMAL HIGH (ref 38–234)

## 2023-04-01 MED ORDER — ACETAMINOPHEN 325 MG PO TABS
650.0000 mg | ORAL_TABLET | Freq: Four times a day (QID) | ORAL | Status: DC | PRN
  Administered 2023-04-03 – 2023-04-05 (×2): 650 mg via ORAL
  Filled 2023-04-01 (×2): qty 2

## 2023-04-01 MED ORDER — WHITE PETROLATUM EX OINT: TOPICAL_OINTMENT | CUTANEOUS | Status: DC | PRN

## 2023-04-01 MED ORDER — ROSUVASTATIN CALCIUM 5 MG PO TABS
10.0000 mg | ORAL_TABLET | Freq: Every day | ORAL | Status: DC
  Administered 2023-04-01 – 2023-04-03 (×3): 10 mg via ORAL
  Filled 2023-04-01 (×3): qty 2

## 2023-04-01 MED ORDER — DOCUSATE SODIUM 50 MG/5ML PO LIQD: 100.0000 mg | Freq: Two times a day (BID) | ORAL | Status: DC

## 2023-04-01 MED ORDER — PROMETHAZINE HCL 12.5 MG PO TABS
25.0000 mg | ORAL_TABLET | Freq: Three times a day (TID) | ORAL | Status: DC | PRN
  Administered 2023-04-02: 25 mg via ORAL
  Filled 2023-04-01: qty 1

## 2023-04-01 MED ORDER — OXYCODONE HCL 5 MG PO TABS
5.0000 mg | ORAL_TABLET | ORAL | Status: DC | PRN
  Administered 2023-04-01: 5 mg via ORAL
  Filled 2023-04-01: qty 1

## 2023-04-01 MED ORDER — SODIUM CHLORIDE 0.9 % IV SOLN: INTRAVENOUS | Status: DC

## 2023-04-01 MED ORDER — LEVETIRACETAM 750 MG PO TABS: 750.0000 mg | ORAL_TABLET | Freq: Two times a day (BID) | ORAL | Status: DC

## 2023-04-01 MED ORDER — SPIRONOLACTONE 12.5 MG HALF TABLET
12.5000 mg | ORAL_TABLET | Freq: Every day | ORAL | Status: DC
  Administered 2023-04-01 – 2023-04-05 (×5): 12.5 mg via ORAL
  Filled 2023-04-01 (×5): qty 1

## 2023-04-01 MED ORDER — POLYETHYLENE GLYCOL 3350 17 G PO PACK: 17.0000 g | PACK | Freq: Every day | ORAL | Status: DC

## 2023-04-01 MED ORDER — LEVETIRACETAM 750 MG PO TABS
750.0000 mg | ORAL_TABLET | Freq: Two times a day (BID) | ORAL | Status: DC
  Administered 2023-04-01 – 2023-04-05 (×9): 750 mg via ORAL
  Filled 2023-04-01 (×10): qty 1

## 2023-04-01 MED ORDER — FLUOXETINE HCL 20 MG PO CAPS
30.0000 mg | ORAL_CAPSULE | Freq: Every day | ORAL | Status: DC
  Administered 2023-04-01 – 2023-04-05 (×5): 30 mg via ORAL
  Filled 2023-04-01 (×6): qty 1

## 2023-04-01 MED ORDER — AMLODIPINE BESYLATE 5 MG PO TABS
5.0000 mg | ORAL_TABLET | Freq: Every day | ORAL | Status: DC
  Administered 2023-04-01 – 2023-04-05 (×5): 5 mg via ORAL
  Filled 2023-04-01 (×5): qty 1

## 2023-04-01 MED ORDER — LETROZOLE 2.5 MG PO TABS
2.5000 mg | ORAL_TABLET | Freq: Every day | ORAL | Status: DC
  Administered 2023-04-01 – 2023-04-05 (×5): 2.5 mg via ORAL
  Filled 2023-04-01 (×6): qty 1

## 2023-04-01 MED ORDER — OLANZAPINE 5 MG PO TABS
15.0000 mg | ORAL_TABLET | Freq: Every day | ORAL | Status: DC
  Administered 2023-04-01 – 2023-04-04 (×4): 15 mg via ORAL
  Filled 2023-04-01 (×6): qty 1

## 2023-04-01 MED ORDER — GABAPENTIN 300 MG PO CAPS
300.0000 mg | ORAL_CAPSULE | Freq: Every day | ORAL | Status: DC
  Administered 2023-04-01 – 2023-04-05 (×5): 300 mg via ORAL
  Filled 2023-04-01 (×5): qty 1

## 2023-04-01 MED ORDER — CALCIUM GLUCONATE-NACL 2-0.675 GM/100ML-% IV SOLN
2.0000 g | Freq: Once | INTRAVENOUS | Status: AC
  Administered 2023-04-01: 2000 mg via INTRAVENOUS
  Filled 2023-04-01: qty 100

## 2023-04-01 MED ORDER — LORAZEPAM 1 MG PO TABS
2.0000 mg | ORAL_TABLET | ORAL | Status: DC | PRN
  Filled 2023-04-01: qty 2

## 2023-04-01 MED ORDER — POTASSIUM CHLORIDE 20 MEQ PO PACK
40.0000 meq | PACK | Freq: Two times a day (BID) | ORAL | Status: AC
  Administered 2023-04-01: 40 meq via ORAL
  Filled 2023-04-01: qty 2

## 2023-04-01 MED ORDER — LISINOPRIL 10 MG PO TABS
10.0000 mg | ORAL_TABLET | Freq: Every day | ORAL | Status: DC
  Administered 2023-04-01 – 2023-04-05 (×5): 10 mg via ORAL
  Filled 2023-04-01 (×5): qty 1

## 2023-04-01 MED ORDER — POTASSIUM CHLORIDE 20 MEQ PO PACK: 40.0000 meq | PACK | Freq: Two times a day (BID) | ORAL | Status: DC

## 2023-04-01 MED ORDER — MAGNESIUM SULFATE 2 GM/50ML IV SOLN
2.0000 g | Freq: Once | INTRAVENOUS | Status: AC
  Administered 2023-04-01: 2 g via INTRAVENOUS
  Filled 2023-04-01: qty 50

## 2023-04-01 MED ORDER — LORAZEPAM 1 MG PO TABS
1.0000 mg | ORAL_TABLET | ORAL | Status: DC | PRN
  Filled 2023-04-01: qty 1

## 2023-04-01 MED ORDER — PANTOPRAZOLE SODIUM 40 MG PO TBEC
40.0000 mg | DELAYED_RELEASE_TABLET | Freq: Every day | ORAL | Status: DC
  Administered 2023-04-01 – 2023-04-05 (×5): 40 mg via ORAL
  Filled 2023-04-01 (×5): qty 1

## 2023-04-01 MED ORDER — LORAZEPAM 1 MG PO TABS
2.0000 mg | ORAL_TABLET | ORAL | Status: DC | PRN
  Administered 2023-04-01: 1 mg via ORAL

## 2023-04-01 MED ORDER — POTASSIUM PHOSPHATES 15 MMOLE/5ML IV SOLN
30.0000 mmol | Freq: Once | INTRAVENOUS | Status: AC
  Administered 2023-04-01: 30 mmol via INTRAVENOUS
  Filled 2023-04-01: qty 10

## 2023-04-01 MED ORDER — METHIMAZOLE 5 MG PO TABS
5.0000 mg | ORAL_TABLET | Freq: Every day | ORAL | Status: DC
  Administered 2023-04-01 – 2023-04-05 (×5): 5 mg via ORAL
  Filled 2023-04-01 (×5): qty 1

## 2023-04-01 MED ORDER — IOHEXOL 350 MG/ML SOLN
75.0000 mL | Freq: Once | INTRAVENOUS | Status: AC | PRN
  Administered 2023-04-01: 75 mL via INTRAVENOUS

## 2023-04-01 MED ORDER — UBROGEPANT 100 MG PO TABS: 100.0000 mg | ORAL_TABLET | ORAL | Status: DC | PRN

## 2023-04-01 MED ORDER — FAMOTIDINE 20 MG PO TABS
20.0000 mg | ORAL_TABLET | Freq: Two times a day (BID) | ORAL | Status: DC
  Administered 2023-04-01 – 2023-04-02 (×2): 20 mg via ORAL
  Filled 2023-04-01 (×2): qty 1

## 2023-04-01 NOTE — Progress Notes (Signed)
Pt. Bladder scanned twice during shift and informed MD. Order to place foley. Unable to place foley after several attempts by 3 different nurses. MD informed at end of shift about unsuccessful attempts due to her anatomy and hx. Of Vulvular Cancer. Pt. Continues to report no sensation or urgency to have to urinate. Per MD, night team to follow up. Reported to night shift team.

## 2023-04-01 NOTE — H&P (Signed)
NAMEARIEN BLACKSTOCK, MRN:  [id number], DOB:  10/09/1975, LOS: 1 ADMISSION DATE:  03/31/2023, CONSULTATION DATE:  04/01/23 REFERRING MD:  EDP, CHIEF COMPLAINT:  seizure   History of Present Illness:  HPI obtained from pt's daughter, Melton Alar and pt's sister, Misty Stanley at bedside as patient remains encephalopathic.    47 yr old female with PMH as below, who presented to ER by EMS after being found minimally responsive on the floor by her daughter this morning after 915am.  Daughter reports patient has had intermittent nausea and vomiting being treated with phenergan for months but worse over the last week with poor PO intake.  No reported weight loss.  Also c/o of bone pain for one month and worsening of her chronic headaches/ migraines, treating with tylenol as pt avoids narcotics.  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  No previous history of seizures    Pt called her daughter asking her to come over this morning, didn't feel well.  Daughter found her on the floor less than 30 mins later.  Witnessed seizure, then 2 additional seizures with EMS.  In ER, had rightward gaze and remained minimally responsive with additional two seizures in ER.  Neurology consulted emergently.  CTH showed 1.6 x 0.5 cm hyperdense extra-axial focus over left frontal lobe concerning for either SDH with fall vs mass, cervical CT neg for acute fracture.  Loaded with keppra and treated with ativan.  Labs significant for WBC 25.8, K 3.4, sCr 1, Mag 0.8, phos 5.4, calcium 10.6, AST 47, TSH 4.7, glucose 316, AG 33.  Spot EEG pending.  Pt has remained poorly responsive, but starting to wake up.  MRI and LP pending. PCCM called for admit.   Pertinent  Medical History  Tobacco abuse, Breast and vulvar cancer (01/2020 s/p chemo/ radiation/ hysterectomy maintained on letrozole), HTN, IDA,  Graves disease, anxiety/ bipolar, hiatal hernia, former ETOH abuse, TMJ/ lock  jaw  Significant Hospital Events: Including procedures, antibiotic start and stop dates in addition to other pertinent events   7/26 presented to the ED with concern for status, intubated  7/27: MRI negative for acute intracranial process.  Negative CSF pathogen assay.  CSF analysis not consistent with infection or inflammation.  Interim History / Subjective:  No further seizures.  Objective   Blood pressure (!) 97/57, pulse 69, temperature 99.3 F (37.4 C), temperature source Axillary, resp. rate 19, height 5\' 5"  (1.651 m), weight 87.6 kg, SpO2 96%.    Vent Mode: PSV;CPAP FiO2 (%):  [36 %-100 %] 36 % Set Rate:  [18 bmp-20 bmp] 20 bmp Vt Set:  [450 mL] 450 mL PEEP:  [5 cmH20] 5 cmH20 Pressure Support:  [5 cmH20] 5 cmH20 Plateau Pressure:  [12 cmH20] 12 cmH20   Intake/Output Summary (Last 24 hours) at 04/01/2023 1202 Last data filed at 04/01/2023 1000 Gross per 24 hour  Intake 4886.32 ml  Output 375 ml  Net 4511.32 ml   Filed Weights   03/31/23 1550 04/01/23 0431  Weight: 86.2 kg 87.6 kg    General:  well-nourished F, minimally responsive in no acute distress HEENT: MM pink/moist, orally intubated. Neuro: Wake and following commands. CV: s1s2 rrr, no m/r/g PULM:  Clear bilaterally on [place] without rhonchi or wheezing.  Tolerated SBT. GI: soft, non-distended  Extremities: warm/dry, no edema  Skin: no rashes or lesions  Ancillary tests personally reviewed  Reactive leukocytosis 17.6.  Assessment & Plan:  New onset of seizure activity s/p fall.  Etiology unclear. Acute encephalopathy, likely postictal Prior history of breast and valvular cancer s/p chemoradiation Poor oral intake due to nausea and vomiting of unclear etiology Hypomagnesemia/hypokalemia High anion gap metabolic acidosis due to lactic acidosis caused by seizures Acute respiratory insufficiency    Plan:  -Extubate today -Continue Keppra. -Progressive ambulation. -Seizure may have been caused by  syncopal episode from hypotension from dehydration.  Patient has been having frequent nausea and vomiting.  Best Practice (right click and "Reselect all SmartList Selections" daily)   Diet/type: NPO postextubation swallow evaluation DVT prophylaxis: SCD-progressive ambulation. GI prophylaxis: H2B Lines: N/A Foley:  N/A Code Status:  full code Last date of multidisciplinary goals of care discussion [pending, family updated at the bedside]   CRITICAL CARE Performed by: Lynnell Catalan   Total critical care time: 35 minutes  Critical care time was exclusive of separately billable procedures and treating other patients.  Critical care was necessary to treat or prevent imminent or life-threatening deterioration.  Critical care was time spent personally by me on the following activities: development of treatment plan with patient and/or surrogate as well as nursing, discussions with consultants, evaluation of patient's response to treatment, examination of patient, obtaining history from patient or surrogate, ordering and performing treatments and interventions, ordering and review of laboratory studies, ordering and review of radiographic studies, pulse oximetry and re-evaluation of patient's condition.  Lynnell Catalan, MD [facility] ICU Physician [facility] Critical Care  Pager: [phone] Or Epic Secure Chat After hours: [phone].  04/01/2023, 12:03 PM

## 2023-04-01 NOTE — Procedures (Addendum)
Patient Name: Sandra Miller  MRN: [id number]  Epilepsy Attending: Charlsie Quest  Referring Physician/Provider: Cheri Fowler, MD  Duration: 03/31/2023 2048 to 04/01/2023 1407   Patient history: 47 yo F patient called daughter and told her she was not feeling well c/o dizziness. Daughter went to check on patient and found her on the floor unresponsive, marking noted to side of head patient may have struck head on dresser. Per EMS patient had a total of 3 seizures, and received a total of 5 mg IV Versed. On arrival patient posturing and eyes deviated to right. EEG to evaluate for seizure.   Level of alertness: lethargic   AEDs during EEG study: LEV, Ativan   Technical aspects: This EEG study was done with scalp electrodes positioned according to the 10-20 International system of electrode placement. Electrical activity was reviewed with band pass filter of 1-70Hz , sensitivity of 7 uV/mm, display speed of 58mm/sec with a 60Hz  notched filter applied as appropriate. EEG data were recorded continuously and digitally stored.  Video monitoring was available and reviewed as appropriate.   Description: The posterior dominant rhythm consists of 8-9 Hz activity of moderate voltage (25-35 uV) seen predominantly in posterior head regions, symmetric and reactive to eye opening and eye closing. Sleep was characterized by vertex waves, sleep spindles (12 to 14 Hz), maximal frontocentral region. EEG showed intermittent generalized 3-6hz  theta-delta slowing. Hyperventilation and photic stimulation were not performed.      ABNORMALITY - Intermittent slow, generalized   IMPRESSION: This study is suggestive of mild to moderate diffuse encephalopathy, nonspecific etiology. No seizures or epileptiform discharges were seen throughout the recording.    Shahed Yeoman Annabelle Harman

## 2023-04-01 NOTE — Progress Notes (Signed)
200cc Fentanyl wasted in med room by this RN and witessed by Deland Pretty RN

## 2023-04-01 NOTE — Procedures (Signed)
Extubation Procedure Note  Patient Details:   Name: ERIEL SCHMALL DOB: 03-21-1976 MRN: [id number]   Airway Documentation:    Vent end date: 04/01/23 Vent end time: 0929   Evaluation  O2 sats: stable throughout Complications: No apparent complications Patient did tolerate procedure well. Bilateral Breath Sounds: Clear, Diminished   Yes, pt could speak post extubation.  Pt extubated to 4 l/m [place] without difficulty.  Audrie Lia 04/01/2023, 9:30 AM

## 2023-04-01 NOTE — Progress Notes (Addendum)
Brief HPI: 47  y.o. female past medical history of breast and vulvar cancer, anxiety, depression, Graves' disease, hypertension, presenting to the emergency department after having been found down and witnessed seizures-1 at home and 2 in the emergency room without return to baseline. Daughter reports that the patient has not been feeling well for months-has had persistent nausea and vomiting.  Called her this morning saying that she is not feeling well.  When the patient's daughter got to home, patient was unresponsive on the floor with presumed having hit her head on a dresser.  Total of 3 seizures by EMS.  Received 5 mg IV Versed.  Rightward gaze deviation noted on initial ER examination.  Loaded with Keppra.  Benzo given.  Interval Hx: Patient extubated this morning.  Multiple family members in the room.  Video EEG overnight is negative.  She feels she is still confused not quite back to her baseline.  She has been feeling "sick" for few weeks now predominantly nausea vomiting poor appetite.  She does not recall calling family members from her bed time she felt sick prior to her being found unresponsive and on the ground next to her bed.  Family does report that she was not eating or drinking much due to not feeling well.   On exam In general placement no acute distress.  Sitting up in bed. Respiratory: Nonlabored Neuroexam: She is alert awake.  She is oriented to person and place.  When asked for the year she says she did not know but when pressed she said it was 2024.  She knew the month.  She follow commands.  No aphasia.  Cranial nerves II through XII pupils equal and reactive.  Extraocular is intact.  Face symmetric. Motor exam: Moving all extremities nonfocal. Sensory: Intact to light touch throughout. Gait: Deferred  Objective: Vital signs in last 24 hours: Temp:  [97.5 F (36.4 C)-99.8 F (37.7 C)] 99.3 F (37.4 C) (07/27 1154) Pulse Rate:  [67-140] 69 (07/27 0741) Resp:   [17-32] 19 (07/27 0741) BP: (85-174)/(57-125) 97/57 (07/27 0741) SpO2:  [95 %-100 %] 96 % (07/27 0741) FiO2 (%):  [36 %-100 %] 36 % (07/27 0930) Weight:  [86.2 kg-87.6 kg] 87.6 kg (07/27 0431)  Intake/Output from previous day: 07/26 0701 - 07/27 0700 In: 4413.6 [I.V.:1772.3; IV Piggyback:2641.3] Out: 375 [Urine:375] Intake/Output this shift: Total I/O In: 472.7 [I.V.:472.7] Out: -  Nutritional status:  Diet Order             Diet NPO time specified  Diet effective now                     Lab Results: Recent Labs    03/31/23 1103 03/31/23 1112 03/31/23 2148 04/01/23 0630 04/01/23 0634 04/01/23 0743  WBC 25.8*  --   --   --   --  17.6*  HGB 15.1*   < >  --  11.6*  --  11.1*  HCT 49.0*   < >  --  34.0*  --  32.5*  PLT 455*  --   --   --   --  249  NA  --    < > 142 141 141  --   K  --    < > 2.9* 2.7* 2.9*  --   CL  --    < > 103  --  107  --   CO2  --    < > 22  --  21*  --  GLUCOSE  --    < > 111*  --  116*  --   BUN  --    < > 12  --  12  --   CREATININE  --    < > 0.82  --  0.69  --   CALCIUM  --    < > 8.8*  --  8.2*  --    < > = values in this interval not displayed.   Lipid Panel No results for input(s): "CHOL", "TRIG", "HDL", "CHOLHDL", "VLDL", "LDLCALC" in the last 72 hours.  Studies/Results: Overnight EEG with video  Result Date: 04/01/2023 Charlsie Quest, MD     04/01/2023  7:34 AM Patient Name: TUESDAE SENTMAN MRN: [id number] Epilepsy Attending: Charlsie Quest Referring Physician/Provider: Cheri Fowler, MD Duration: 03/31/2023 2048 to 04/01/2023 0730  Patient history: 47 yo F patient called daughter and told her she was not feeling well c/o dizziness. Daughter went to check on patient and found her on the floor unresponsive, marking noted to side of head patient may have struck head on dresser. Per EMS patient had a total of 3 seizures, and received a total of 5 mg IV Versed. On arrival patient posturing and eyes deviated to right. EEG to evaluate  for seizure.  Level of alertness: lethargic  AEDs during EEG study: LEV, Ativan  Technical aspects: This EEG study was done with scalp electrodes positioned according to the 10-20 International system of electrode placement. Electrical activity was reviewed with band pass filter of 1-70Hz , sensitivity of 7 uV/mm, display speed of 12mm/sec with a 60Hz  notched filter applied as appropriate. EEG data were recorded continuously and digitally stored.  Video monitoring was available and reviewed as appropriate.  Description: The posterior dominant rhythm consists of 8-9 Hz activity of moderate voltage (25-35 uV) seen predominantly in posterior head regions, symmetric and reactive to eye opening and eye closing. Sleep was characterized by vertex waves, sleep spindles (12 to 14 Hz), maximal frontocentral region. EEG showed intermittent generalized 3-6hz  theta-delta slowing. Hyperventilation and photic stimulation were not performed.    ABNORMALITY - Intermittent slow, generalized  IMPRESSION: This study is suggestive of mild to moderate diffuse encephalopathy, nonspecific etiology. No seizures or epileptiform discharges were seen throughout the recording.   Charlsie Quest   DG Abd Portable 1V  Result Date: 03/31/2023 CLINICAL DATA:  Orogastric tube placement. EXAM: PORTABLE ABDOMEN - 1 VIEW COMPARISON:  None Available. FINDINGS: Tip and side port of the enteric tube below the diaphragm in the stomach. Nonobstructive upper bowel gas pattern. IMPRESSION: Tip and side port of the enteric tube below the diaphragm in the stomach. Electronically Signed   By: Narda Rutherford M.D.   On: 03/31/2023 18:38   MR BRAIN W WO CONTRAST  Addendum Date: 03/31/2023   ADDENDUM REPORT: 03/31/2023 17:49 ADDENDUM: On further review, there is subtle susceptibility artifact in the region of possible extra-axial hemorrhage seen on same day CT head. This area was better characterized on same day CT head. No significant mass effect or  adjacent brain edema. Findings discussed with Dr. Wilford Corner via telephone at 5:49 p.m. Electronically Signed   By: Feliberto Harts M.D.   On: 03/31/2023 17:49   Result Date: 03/31/2023 CLINICAL DATA:  Neuro deficit, acute, stroke suspected EXAM: MRI HEAD WITHOUT AND WITH CONTRAST TECHNIQUE: Multiplanar, multiecho pulse sequences of the brain and surrounding structures were obtained without and with intravenous contrast. CONTRAST:  8.39mL GADAVIST GADOBUTROL 1 MMOL/ML IV SOLN COMPARISON:  CT  head 03/31/2023. FINDINGS: Brain: No acute infarction, hemorrhage, hydrocephalus, extra-axial collection or mass lesion. Vascular: Major arterial flow voids are maintained. Skull and upper cervical spine: Normal marrow signal. Sinuses/Orbits: Mild paranasal sinus mucosal thickening. Left maxillary sinus retention cyst. No acute orbital finding. Other: Small right mastoid effusion. IMPRESSION: No evidence of acute intracranial abnormality. Electronically Signed: By: Feliberto Harts M.D. On: 03/31/2023 17:26   DG Chest Portable 1 View  Result Date: 03/31/2023 CLINICAL DATA:  AMS, Seizures EXAM: PORTABLE CHEST 1 VIEW COMPARISON:  04/11/2020. FINDINGS: Bilateral lung fields are clear. Bilateral costophrenic angles are clear. Normal cardio-mediastinal silhouette. No acute osseous abnormalities. The soft tissues are within normal limits. IMPRESSION: No active disease. Electronically Signed   By: Jules Schick M.D.   On: 03/31/2023 13:04   CT Head Wo Contrast  Result Date: 03/31/2023 CLINICAL DATA:  Provided history: Poly trauma, blunt. Seizure, new onset, no history of trauma. Additional history provided by ER physician: Cancer history. Patient found down with bruising to left cheek. EXAM: CT HEAD WITHOUT CONTRAST CT CERVICAL SPINE WITHOUT CONTRAST TECHNIQUE: Multidetector CT imaging of the head and cervical spine was performed following the standard protocol without intravenous contrast. Multiplanar CT image reconstructions  of the cervical spine were also generated. RADIATION DOSE REDUCTION: This exam was performed according to the departmental dose-optimization program which includes automated exposure control, adjustment of the mA and/or kV according to patient size and/or use of iterative reconstruction technique. COMPARISON:  CT angiogram head 06/25/2022. CT venogram head 06/25/2022. Cervical spine MRI 11/05/2014. FINDINGS: CT HEAD FINDINGS Brain: No age advanced or lobar predominant parenchymal atrophy. Extra-axial hyperdense focus overlying the high left frontal lobe, measuring 1.6 x 0.5 cm (best appreciated on series 5, image 46). There is contact upon the underlying brain parenchyma. No demarcated cortical infarct. No extra-axial fluid collection. No midline shift. Vascular: No hyperdense vessel.  Atherosclerotic calcifications. Skull: No calvarial fracture or aggressive osseous lesion. Sinuses/Orbits: No mass or acute finding within the imaged orbits. 2.1 cm mucous retention cyst within the left maxillary sinus. Mild mucosal thickening scattered within bilateral ethmoid air cells. Other: Small-volume fluid within the right mastoid air cells. Anterior displacement of the mandibular condyles bilaterally. CT CERVICAL SPINE FINDINGS Alignment: Dextrocurvature of the cervical spine. Partially imaged levocurvature of the upper thoracic spine. No significant spondylolisthesis. Skull base and vertebrae: The basion-dental and atlanto-dental intervals are maintained.No evidence of acute fracture to the cervical spine. Soft tissues and spinal canal: No prevertebral fluid or swelling. No visible canal hematoma. Disc levels: Cervical spondylosis with multilevel disc space narrowing, disc bulges/central disc protrusions, posterior disc osteophyte complexes, endplate spurring and uncovertebral hypertrophy. Multilevel spinal canal stenosis, most notably as follows. At C3-C4, a central disc protrusion contributes to apparent moderate spinal  canal stenosis. At C4-C5 and C5-C6, posterior disc osteophyte complexes contribute to at least moderate spinal canal stenosis. Multilevel neural foraminal narrowing. Upper chest: No consolidation within the imaged lung apices. No visible pneumothorax. Centrilobular and paraseptal emphysema. Impression #1 called by telephone at the time of interpretation on 03/31/2023 at 10:35 Am to provider [facility] , who verbally acknowledged these results. IMPRESSION: CT head: 1. 1.6 x 0.5 cm hyperdense extra-axial focus overlying the high left frontal lobe. Given recent trauma, this may reflect an acute extra-axial hemorrhage. However, a dural-based mass cannot be excluded. This should be followed up with non-contrast head CTs to resolution, or further evaluated with a brain MRI (with and without contrast). 2. Anterior displacement of the mandibular condyles bilaterally. Correlate  with physical exam findings to exclude TMJ dislocation. 3. Paranasal sinus disease as described. 4. Small-volume fluid within the right mastoid air cells. CT  cervical spine: 1. No evidence of an acute cervical spine fracture. 2. Mild Dextrocurvature of the cervical spine, possibly positional. 3. Cervical spondylosis as described. 4. Emphysema (ICD10-J43.9). Electronically Signed   By: Jackey Loge D.O.   On: 03/31/2023 12:03   CT Cervical Spine Wo Contrast  Result Date: 03/31/2023 CLINICAL DATA:  Provided history: Poly trauma, blunt. Seizure, new onset, no history of trauma. Additional history provided by ER physician: Cancer history. Patient found down with bruising to left cheek. EXAM: CT HEAD WITHOUT CONTRAST CT CERVICAL SPINE WITHOUT CONTRAST TECHNIQUE: Multidetector CT imaging of the head and cervical spine was performed following the standard protocol without intravenous contrast. Multiplanar CT image reconstructions of the cervical spine were also generated. RADIATION DOSE REDUCTION: This exam was performed according to the departmental  dose-optimization program which includes automated exposure control, adjustment of the mA and/or kV according to patient size and/or use of iterative reconstruction technique. COMPARISON:  CT angiogram head 06/25/2022. CT venogram head 06/25/2022. Cervical spine MRI 11/05/2014. FINDINGS: CT HEAD FINDINGS Brain: No age advanced or lobar predominant parenchymal atrophy. Extra-axial hyperdense focus overlying the high left frontal lobe, measuring 1.6 x 0.5 cm (best appreciated on series 5, image 46). There is contact upon the underlying brain parenchyma. No demarcated cortical infarct. No extra-axial fluid collection. No midline shift. Vascular: No hyperdense vessel.  Atherosclerotic calcifications. Skull: No calvarial fracture or aggressive osseous lesion. Sinuses/Orbits: No mass or acute finding within the imaged orbits. 2.1 cm mucous retention cyst within the left maxillary sinus. Mild mucosal thickening scattered within bilateral ethmoid air cells. Other: Small-volume fluid within the right mastoid air cells. Anterior displacement of the mandibular condyles bilaterally. CT CERVICAL SPINE FINDINGS Alignment: Dextrocurvature of the cervical spine. Partially imaged levocurvature of the upper thoracic spine. No significant spondylolisthesis. Skull base and vertebrae: The basion-dental and atlanto-dental intervals are maintained.No evidence of acute fracture to the cervical spine. Soft tissues and spinal canal: No prevertebral fluid or swelling. No visible canal hematoma. Disc levels: Cervical spondylosis with multilevel disc space narrowing, disc bulges/central disc protrusions, posterior disc osteophyte complexes, endplate spurring and uncovertebral hypertrophy. Multilevel spinal canal stenosis, most notably as follows. At C3-C4, a central disc protrusion contributes to apparent moderate spinal canal stenosis. At C4-C5 and C5-C6, posterior disc osteophyte complexes contribute to at least moderate spinal canal  stenosis. Multilevel neural foraminal narrowing. Upper chest: No consolidation within the imaged lung apices. No visible pneumothorax. Centrilobular and paraseptal emphysema. Impression #1 called by telephone at the time of interpretation on 03/31/2023 at 10:35 Am to provider [facility] , who verbally acknowledged these results. IMPRESSION: CT head: 1. 1.6 x 0.5 cm hyperdense extra-axial focus overlying the high left frontal lobe. Given recent trauma, this may reflect an acute extra-axial hemorrhage. However, a dural-based mass cannot be excluded. This should be followed up with non-contrast head CTs to resolution, or further evaluated with a brain MRI (with and without contrast). 2. Anterior displacement of the mandibular condyles bilaterally. Correlate with physical exam findings to exclude TMJ dislocation. 3. Paranasal sinus disease as described. 4. Small-volume fluid within the right mastoid air cells. CT  cervical spine: 1. No evidence of an acute cervical spine fracture. 2. Mild Dextrocurvature of the cervical spine, possibly positional. 3. Cervical spondylosis as described. 4. Emphysema (ICD10-J43.9). Electronically Signed   By: Jackey Loge D.O.   On:  03/31/2023 12:03   EEG adult  Result Date: 03/31/2023 Charlsie Quest, MD     03/31/2023  4:06 PM Patient Name: REILEY UPRIGHT MRN: [id number] Epilepsy Attending: Charlsie Quest Referring Physician/Provider: Milon Dikes, MD Date: 03/31/2023 Duration: 30.19 mins Patient history: 47 yo F patient called daughter and told her she was not feeling well c/o dizziness. Daughter went to check on patient and found her on the floor unresponsive, marking noted to side of head patient may have struck head on dresser. Per EMS patient had a total of 3 seizures, and received a total of 5 mg IV Versed. On arrival patient posturing and eyes deviated to right. EEG to evaluate for seizure. Level of alertness: lethargic AEDs during EEG study: LEV, Ativan Technical  aspects: This EEG study was done with scalp electrodes positioned according to the 10-20 International system of electrode placement. Electrical activity was reviewed with band pass filter of 1-70Hz , sensitivity of 7 uV/mm, display speed of 40mm/sec with a 60Hz  notched filter applied as appropriate. EEG data were recorded continuously and digitally stored.  Video monitoring was available and reviewed as appropriate. Description: No clear posterior dominant rhythm was seen. EEG showed continuous generalized mixed frequencies with predominantly 9-10hz  alpha activity admixed with intermittent generalized 3-6hz  theta-delta slowing as well as 13-15hz  fronto-central beta activity. The amplitude of waves was lower in left temporo-parietal region which could be artifactual due to electrode impedence or underlying structural abnormality. Hyperventilation and photic stimulation were not performed.   Of note, study was technically difficult due to significant electrode and movement artifact. ABNORMALITY - Continuous slow, generalized IMPRESSION: This technically difficult study is suggestive of moderate diffuse encephalopathy, nonspecific etiology. No seizures or epileptiform discharges were seen throughout the recording. The amplitude of waves was lower in left temporo-parietal region which could be suggestive cortical dysfunction due to underlying structural abnormality. However, difficult to rule out artifact due to the quality of study. Consider repeat EEG if concern for ictal-interictal activity persists. Priyanka Annabelle Harman    Medications: I have reviewed the patient's current medications. Prior to Admission:  Medications Prior to Admission  Medication Sig Dispense Refill Last Dose   acetaminophen (TYLENOL) 500 MG tablet Take 1 tablet (500 mg total) by mouth every 6 (six) hours as needed. (Patient taking differently: Take 500 mg by mouth every 6 (six) hours as needed for moderate pain.) 30 tablet 0 03/28/2023    amLODipine (NORVASC) 5 MG tablet Take 5 mg by mouth daily.   03/30/2023   diclofenac Sodium (VOLTAREN) 1 % GEL Apply 4 grams topically 4  times daily as needed. (Patient taking differently: Apply 4 g topically 4 (four) times daily as needed (pain).) 500 g 6 unk last dose   FLUoxetine (PROZAC) 10 MG capsule Take 3 capsules (30 mg total) by mouth daily. (Patient taking differently: Take 30 mg by mouth 3 (three) times daily.) 90 capsule 2 03/30/2023   Fremanezumab-vfrm (AJOVY) 225 MG/1.5ML SOAJ Inject 225 mg into the skin every 30 (thirty) days. 1.5 mL 8 03/07/2023   gabapentin (NEURONTIN) 300 MG capsule Take 1 capsule (300 mg total) by mouth daily. Pt needs office visit. 30 capsule 5 03/30/2023   letrozole (FEMARA) 2.5 MG tablet Take 1 tablet by mouth daily. 90 tablet 3 03/30/2023   lisinopril (ZESTRIL) 10 MG tablet Take 1 tablet (10 mg total) by mouth daily. Stop the Lisinopril/HCTZ combination 90 tablet 1 03/30/2023   LORazepam (ATIVAN) 1 MG tablet Take 1 tablet (1 mg total) by mouth  3 (three) times daily as needed for anxiety (Patient taking differently: Take 1 mg by mouth 3 (three) times daily as needed for anxiety or seizure.) 90 tablet 1 03/30/2023   methimazole (TAPAZOLE) 5 MG tablet Take one tablet (5 mg dose) by mouth daily. (Patient taking differently: Take 5 mg by mouth daily.) 90 tablet 3 03/30/2023   Multiple Vitamin (MULTIVITAMIN WITH MINERALS) TABS tablet Take 1 tablet by mouth daily.   03/30/2023   OLANZapine (ZYPREXA) 15 MG tablet Take 1 tablet (15 mg total) by mouth at bedtime. 30 tablet 2 03/30/2023   omeprazole (PRILOSEC) 40 MG capsule Take 1 capsule (40 mg total) by mouth in the morning and at bedtime. 60 capsule 5 03/30/2023   ondansetron (ZOFRAN-ODT) 8 MG disintegrating tablet Dissolve 1 tablet (8 mg total) by mouth every 8 (eight) hours as needed for nausea or vomiting. 20 tablet 0 03/30/2023   potassium chloride SA (KLOR-CON M) 20 MEQ tablet Take 1 tablet (20 mEq) by mouth daily. 90 tablet 1  03/30/2023   promethazine (PHENERGAN) 25 MG tablet Take 1 tablet (25 mg total) by mouth every 8 (eight) hours as needed for nausea or vomiting. 20 tablet 1 03/31/2023   rosuvastatin (CRESTOR) 10 MG tablet Take 1 tablet (10 mg total) by mouth daily. 90 tablet 3 03/30/2023   senna-docusate (SENOKOT-S) 8.6-50 MG tablet Take 2 tablets by mouth at bedtime. For AFTER surgery only, do not take if having diarrhea (Patient taking differently: Take 2 tablets by mouth at bedtime as needed for moderate constipation. For AFTER surgery only, do not take if having diarrhea) 30 tablet 0 03/17/2023   spironolactone (ALDACTONE) 25 MG tablet Take 0.5 tablets (12.5 mg total) by mouth daily. 60 tablet 1 03/30/2023   Ubrogepant (UBRELVY) 100 MG TABS Take 1 tablet (100 mg total) by mouth every 2 (two) hours as needed. Maximum 200mg  a day. (Patient taking differently: Take 100 mg by mouth every 2 (two) hours as needed (migraine). Maximum 200mg  a day.) 16 tablet 11 unk last dose   letrozole (FEMARA) 2.5 MG tablet Take 1 tablet (2.5 mg total) by mouth daily. (Patient not taking: Reported on 03/31/2023) 90 tablet 3 Not Taking   methylPREDNISolone (MEDROL DOSEPAK) 4 MG TBPK tablet Take per packet instructions. Taper dosing. (Patient not taking: Reported on 03/31/2023) 21 each 0 Not Taking   propranolol (INDERAL) 10 MG tablet Take 1 tablet by mouth 2 (two) times daily. (Patient not taking: Reported on 03/06/2023) 180 tablet 1 Not Taking    Assessment/Plan: 47 year old with new onset seizures with multiple witnessed in the ED.  Loaded with Keppra.  Video EEG overnight is negative.  Not feeling well for several weeks may be related to poor p.o. 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  CT artifact concern for possible small bleed versus AVM.  Recommendations Continue Keppra 750 twice daily.  F/u outpt neurology. Maybe able to titrate off at a later date since this is likely a  provoked seizure in setting of nausea/vomiting/dehydration if she has no further seizure episodes.  DC video EEG CTA/CTV to rule out AVM however this is low likelihood if negative no further workup needed.  Discussed with Dr. Denese Killings.   ADDENDUM: IMPRESSION: Non-contrast head CT:   1. 1.6 x 0.5 cm extra-axial hyperdense focus overlying the high left frontal lobe, stable in size as compared to the head CT of 03/31/2023. This likely reflects a small acute extra-axial hemorrhage (  given the lack of an enhancing mass at this site on the brain MRI of 03/31/2023). Contact upon the underlying brain parenchyma, as before. 2. No evidence of an interval acute intracranial abnormality. 3. Partially empty sella turcica. This finding can reflect incidental anatomic variation, or alternatively, it can be associated with idiopathic intracranial hypertension (pseudotumor cerebri).   CTA head:   1. Please note due to malfunction of the CT scanner, the cephalad portion of the head (at the level of the high frontoparietal lobes) is excluded from the field of view. Within this limitation, findings are as follows. 2. No intracranial large vessel occlusion or proximal high-grade arterial stenosis. 3. No evidence of an AVM at the imaged levels.  Returned to bedside in afternoon around 4pm to discuss results with pt and two family members in the room.  All questions answered.  She is essentially back to baseline now per family.   Neurology will sign off. Call with questions.    LOS: 1 day   Zenita Kister M Jalexus Brett

## 2023-04-01 NOTE — Progress Notes (Signed)
EEG maint complete. GMD/MB

## 2023-04-02 ENCOUNTER — Other Ambulatory Visit: Payer: Self-pay

## 2023-04-02 DIAGNOSIS — G40901 Epilepsy, unspecified, not intractable, with status epilepticus: Secondary | ICD-10-CM | POA: Diagnosis not present

## 2023-04-02 DIAGNOSIS — R0689 Other abnormalities of breathing: Secondary | ICD-10-CM | POA: Diagnosis not present

## 2023-04-02 DIAGNOSIS — G9340 Encephalopathy, unspecified: Secondary | ICD-10-CM | POA: Diagnosis not present

## 2023-04-02 LAB — BASIC METABOLIC PANEL WITH GFR
Anion gap: 14 (ref 5–15)
BUN: 5 mg/dL — ABNORMAL LOW (ref 6–20)
CO2: 23 mmol/L (ref 22–32)
Calcium: 9.1 mg/dL (ref 8.9–10.3)
Chloride: 102 mmol/L (ref 98–111)
Creatinine, Ser: 0.58 mg/dL (ref 0.44–1.00)
GFR, Estimated: >60 mL/min (ref >60)
Glucose, Bld: 105 mg/dL — ABNORMAL HIGH (ref 70–99)
Potassium: 2.8 mmol/L — ABNORMAL LOW (ref 3.5–5.1)
Sodium: 139 mmol/L (ref 135–145)

## 2023-04-02 LAB — GLUCOSE, CAPILLARY
Glucose-Capillary: 98 mg/dL (ref 70–99)
Glucose-Capillary: 135 mg/dL — ABNORMAL HIGH (ref 70–99)
Glucose-Capillary: 125 mg/dL — ABNORMAL HIGH (ref 70–99)
Glucose-Capillary: 148 mg/dL — ABNORMAL HIGH (ref 70–99)
Glucose-Capillary: 115 mg/dL — ABNORMAL HIGH (ref 70–99)

## 2023-04-02 LAB — MAGNESIUM: Magnesium: 1.8 mg/dL (ref 1.7–2.4)

## 2023-04-02 LAB — URINE CULTURE: Culture: NO GROWTH

## 2023-04-02 LAB — CBC
HCT: 40 % (ref 36.0–46.0)
Hemoglobin: 13.2 g/dL (ref 12.0–15.0)
MCH: 29.1 pg (ref 26.0–34.0)
MCHC: 33 g/dL (ref 30.0–36.0)
MCV: 88.3 fL (ref 80.0–100.0)
Platelets: 261 10*3/uL (ref 150–400)
RBC: 4.53 MIL/uL (ref 3.87–5.11)
RDW: 13.6 % (ref 11.5–15.5)
WBC: 16.5 10*3/uL — ABNORMAL HIGH (ref 4.0–10.5)
nRBC: 0 % (ref 0.0–0.2)

## 2023-04-02 LAB — PHOSPHORUS: Phosphorus: 1.9 mg/dL — ABNORMAL LOW (ref 2.5–4.6)

## 2023-04-02 MED ORDER — DOCUSATE SODIUM 100 MG PO CAPS
100.0000 mg | ORAL_CAPSULE | Freq: Two times a day (BID) | ORAL | Status: DC
  Administered 2023-04-03 – 2023-04-04 (×2): 100 mg via ORAL
  Filled 2023-04-02 (×5): qty 1

## 2023-04-02 MED ORDER — POTASSIUM CHLORIDE CRYS ER 20 MEQ PO TBCR
40.0000 meq | EXTENDED_RELEASE_TABLET | Freq: Once | ORAL | Status: AC
  Administered 2023-04-02: 40 meq via ORAL
  Filled 2023-04-02: qty 2

## 2023-04-02 MED ORDER — INSULIN ASPART 100 UNIT/ML IJ SOLN
0.0000 [IU] | Freq: Three times a day (TID) | INTRAMUSCULAR | Status: DC
  Administered 2023-04-02 – 2023-04-03 (×2): 3 [IU] via SUBCUTANEOUS
  Administered 2023-04-03: 4 [IU] via SUBCUTANEOUS
  Administered 2023-04-03: 3 [IU] via SUBCUTANEOUS
  Administered 2023-04-04: 4 [IU] via SUBCUTANEOUS

## 2023-04-02 MED ORDER — MAGNESIUM SULFATE 2 GM/50ML IV SOLN
2.0000 g | Freq: Once | INTRAVENOUS | Status: AC
  Administered 2023-04-02: 2 g via INTRAVENOUS
  Filled 2023-04-02: qty 50

## 2023-04-02 MED ORDER — POTASSIUM PHOSPHATES 15 MMOLE/5ML IV SOLN
30.0000 mmol | Freq: Once | INTRAVENOUS | Status: AC
  Administered 2023-04-02: 30 mmol via INTRAVENOUS
  Filled 2023-04-02: qty 10

## 2023-04-02 MED ORDER — K PHOS MONO-SOD PHOS DI & MONO 155-852-130 MG PO TABS
500.0000 mg | ORAL_TABLET | Freq: Two times a day (BID) | ORAL | Status: AC
  Administered 2023-04-02 – 2023-04-03 (×2): 500 mg via ORAL
  Filled 2023-04-02 (×3): qty 2

## 2023-04-02 MED ORDER — POTASSIUM CHLORIDE 20 MEQ PO PACK
40.0000 meq | PACK | Freq: Three times a day (TID) | ORAL | Status: DC
  Administered 2023-04-02 – 2023-04-03 (×3): 40 meq via ORAL
  Filled 2023-04-02 (×3): qty 2

## 2023-04-02 MED ORDER — LORAZEPAM 1 MG PO TABS
1.0000 mg | ORAL_TABLET | Freq: Three times a day (TID) | ORAL | Status: DC
  Administered 2023-04-02 – 2023-04-05 (×10): 1 mg via ORAL
  Filled 2023-04-02 (×9): qty 1

## 2023-04-02 NOTE — Evaluation (Signed)
Physical Therapy Evaluation Patient Details Name: Sandra Miller MRN: [id number] DOB: Oct 15, 1975 Today's Date: 04/02/2023  History of Present Illness  Pt is a 47 y.o. F who presents 03/31/2023 with seizures. CTH showed 1.6 x 0.5 cm hyperdense extra-axial focus over left frontal lobe concerning for either SDH with fall vs mass, cervical CT neg for acute fracture. MRI negative for acute process. Significant PMH: breast and vulvar CA, HTN, IDA, Graves disease, anxiety/bipolar.  Clinical Impression  PTA, pt lives with her mother in a level entry apartment and works as a Agricultural engineer. Pt reporting dizziness and nausea, but agreeable to participate in PT evaluation. Able to complete toileting transfers and take pivotal steps to chair with no AD and min guard assist. Pt is somewhat impulsive and exhibiting mild dynamic balance deficits, but suspect steady progress. Of note, pt also reporting sharp R groin pain with weightbearing; RN notified. Will continue to progress mobility as tolerated. Don't anticipate need for PT follow up      Assistance Recommended at Discharge Frequent or constant Supervision/Assistance  If plan is discharge home, recommend the following:  Can travel by private vehicle  Assistance with cooking/housework;Assist for transportation;Help with stairs or ramp for entrance        Equipment Recommendations None recommended by PT  Recommendations for Other Services       Functional Status Assessment Patient has had a recent decline in their functional status and demonstrates the ability to make significant improvements in function in a reasonable and predictable amount of time.     Precautions / Restrictions Precautions Precautions: Fall;Other (comment) Precaution Comments: rectal pouch Restrictions Weight Bearing Restrictions: No      Mobility  Bed Mobility Overal bed mobility: Needs Assistance Bed Mobility: Supine to Sit     Supine to sit:  Supervision     General bed mobility comments: Increased time, cues to initiate    Transfers Overall transfer level: Needs assistance Equipment used: None Transfers: Sit to/from Stand, Bed to chair/wheelchair/BSC Sit to Stand: Min guard Stand pivot transfers: Min guard         General transfer comment: Min guard for stand pivot transfer from bed to [facility] and then [facility] to recliner    Ambulation/Gait                  Stairs            Wheelchair Mobility     Tilt Bed    Modified Rankin (Stroke Patients Only)       Balance Overall balance assessment: Mild deficits observed, not formally tested                                           Pertinent Vitals/Pain Pain Assessment Pain Assessment: Faces Faces Pain Scale: Hurts even more Pain Location: R groin with weightbearing Pain Descriptors / Indicators: Sharp Pain Intervention(s): Monitored during session, Other (comment) (RN notified)    Home Living Family/patient expects to be discharged to:: Private residence Living Arrangements: Parent (mother) Available Help at Discharge: Family Type of Home: Apartment Home Access: Level entry       Home Layout: One level        Prior Function Prior Level of Function : Independent/Modified Independent             Mobility Comments: Works as Agricultural engineer at [employer]  Hand Dominance        Extremity/Trunk Assessment   Upper Extremity Assessment Upper Extremity Assessment: Overall WFL for tasks assessed    Lower Extremity Assessment Lower Extremity Assessment: Overall WFL for tasks assessed    Cervical / Trunk Assessment Cervical / Trunk Assessment: Normal  Communication   Communication: No difficulties  Cognition Arousal/Alertness: Awake/alert Behavior During Therapy: Flat affect Overall Cognitive Status: Impaired/Different from baseline Area of Impairment: Memory, Safety/judgement, Problem  solving                     Memory: Decreased short-term memory   Safety/Judgement: Decreased awareness of deficits, Decreased awareness of safety   Problem Solving: Decreased initiation, Requires verbal cues General Comments: A&Ox3, somewhat impulsive        General Comments      Exercises     Assessment/Plan    PT Assessment Patient needs continued PT services  PT Problem List Decreased strength;Decreased activity tolerance;Decreased balance;Decreased mobility;Decreased cognition;Decreased safety awareness       PT Treatment Interventions Gait training;Functional mobility training;Therapeutic activities;Therapeutic exercise;Balance training;Patient/family education    PT Goals (Current goals can be found in the Care Plan section)  Acute Rehab PT Goals Patient Stated Goal: did not state PT Goal Formulation: With patient Time For Goal Achievement: 04/16/23 Potential to Achieve Goals: Good    Frequency Min 1X/week     Co-evaluation               AM-PAC PT "6 Clicks" Mobility  Outcome Measure Help needed turning from your back to your side while in a flat bed without using bedrails?: None Help needed moving from lying on your back to sitting on the side of a flat bed without using bedrails?: A Little Help needed moving to and from a bed to a chair (including a wheelchair)?: A Little Help needed standing up from a chair using your arms (e.g., wheelchair or bedside chair)?: A Little Help needed to walk in hospital room?: A Little Help needed climbing 3-5 steps with a railing? : A Lot 6 Click Score: 18    End of Session Equipment Utilized During Treatment: Gait belt Activity Tolerance: Patient tolerated treatment well Patient left: in chair;with call bell/phone within reach;with chair alarm set Nurse Communication: Mobility status PT Visit Diagnosis: Unsteadiness on feet (R26.81)    Time: 2130-8657 PT Time Calculation (min) (ACUTE ONLY): 32  min   Charges:   PT Evaluation $PT Eval Moderate Complexity: 1 Mod PT Treatments $Therapeutic Activity: 8-22 mins PT General Charges $$ ACUTE PT VISIT: 1 Visit         Lillia Pauls, PT, DPT Acute Rehabilitation Services Office [phone]   Norval Morton 04/02/2023, 3:51 PM

## 2023-04-02 NOTE — H&P (Signed)
[name], MRN:  [id number], DOB:  Jun 02, 1976, LOS: 2 ADMISSION DATE:  03/31/2023, CONSULTATION DATE:  04/02/23 REFERRING MD:  EDP, CHIEF COMPLAINT:  seizure   History of Present Illness:  HPI obtained from pt's daughter, Sandra Miller and pt's sister, Sandra Miller at bedside as patient remains encephalopathic.    47 yr old female with PMH as below, who presented to ER by EMS after being found minimally responsive on the floor by her daughter this morning after 915am.  Daughter reports patient has had intermittent nausea and vomiting being treated with phenergan for months but worse over the last week with poor PO intake.  No reported weight loss.  Also c/o of bone pain for one month and worsening of her chronic headaches/ migraines, treating with tylenol as pt avoids narcotics.  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  No previous history of seizures    Pt called her daughter asking her to come over this morning, didn't feel well.  Daughter found her on the floor less than 30 mins later.  Witnessed seizure, then 2 additional seizures with EMS.  In ER, had rightward gaze and remained minimally responsive with additional two seizures in ER.  Neurology consulted emergently.  CTH showed 1.6 x 0.5 cm hyperdense extra-axial focus over left frontal lobe concerning for either SDH with fall vs mass, cervical CT neg for acute fracture.  Loaded with keppra and treated with ativan.  Labs significant for WBC 25.8, K 3.4, sCr 1, Mag 0.8, phos 5.4, calcium 10.6, AST 47, TSH 4.7, glucose 316, AG 33.  Spot EEG pending.  Pt has remained poorly responsive, but starting to wake up.  MRI and LP pending. PCCM called for admit.   Pertinent  Medical History  Tobacco abuse, Breast and vulvar cancer (01/2020 s/p chemo/ radiation/ hysterectomy maintained on letrozole), HTN, IDA,  Graves disease, anxiety/ bipolar, hiatal hernia, former ETOH abuse, TMJ/ lock  jaw  Significant Hospital Events: Including procedures, antibiotic start and stop dates in addition to other pertinent events   7/26 presented to the ED with concern for status, intubated  7/27: MRI negative for acute intracranial process.  Negative CSF pathogen assay.  CSF analysis not consistent with infection or inflammation.  Interim History / Subjective:  No further seizures.  No residual symptoms.  Eager to get out of bed.  Objective   Blood pressure (!) 155/72, pulse 100, temperature 98.7 F (37.1 C), temperature source Oral, resp. rate (!) 27, height 5\' 5"  (1.651 m), weight 87.6 kg, SpO2 94%.        Intake/Output Summary (Last 24 hours) at 04/02/2023 1239 Last data filed at 04/02/2023 1100 Gross per 24 hour  Intake 987.59 ml  Output 1300 ml  Net -312.41 ml   Filed Weights   03/31/23 1550 04/01/23 0431 04/02/23 0500  Weight: 86.2 kg 87.6 kg 87.6 kg    General:  well-nourished F, awake and interactive HEENT: MM pink/moist,  Neuro: Neurologically intact with no focal neurological deficits. CV: s1s2 rrr, no m/r/g PULM:  Clear bilaterally  GI: soft, non-distended  Extremities: warm/dry, no edema  Skin: no rashes or lesions  Ancillary tests personally reviewed  Reactive leukocytosis 16.5 Persistent hypokalemia at 2.5 CT head/MRI/CTA negative, CSF not compatible with infection, CSF viral panel negative.  Assessment & Plan:   New onset of seizure activity s/p fall.  Etiology unclear. Acute encephalopathy, likely postictal now completely resolved. Prior history of  breast and valvular cancer s/p chemoradiation Poor oral intake due to nausea and vomiting of unclear etiology Hypomagnesemia/hypokalemia High anion gap metabolic acidosis due to lactic acidosis caused by seizures Acute respiratory insufficiency now resolved. Hypothyroidism Hypertension Anxiety  Plan:  -Ready for transfer. -Progressive ambulation. -Continue Keppra. -Progressive ambulation. -Seizure  may have been caused by syncopal episode from hypotension from dehydration.  Patient has been having frequent nausea and vomiting. -Persistent hypokalemia.  Patient is due to see a nephrologist as an outpatient and has been followed by her PCP for this.  Etiology unclear but may simply be due to vomiting.  May be adverse effect from prior chemotherapy aggressive oral repletion.  Consider inpatient nephrology consultation if potassium fails to increase.  -Home blood pressure medication resumed -Continue home psychiatric medication. -Continue home Tapazole  Best Practice (right click and "Reselect all SmartList Selections" daily)   Diet/type: Regular diet DVT prophylaxis: SCD-progressive ambulation. GI prophylaxis: H2B Lines: N/A Foley:  N/A Code Status:  full code Last date of multidisciplinary goals of care discussion [pending, family updated at the bedside]    Sandra Catalan, MD [facility] ICU Physician [facility] [place] [facility]  Pager: [phone] Or Epic Secure Chat After hours: [phone].  04/02/2023, 12:39 PM

## 2023-04-02 NOTE — Progress Notes (Signed)
[facility] ADULT ICU REPLACEMENT PROTOCOL   The patient does apply for the [facility] Adult ICU Electrolyte Replacment Protocol based on the criteria listed below:   1.Exclusion criteria: TCTS, ECMO, Dialysis, and Myasthenia Gravis patients 2. Is GFR >/= 30 ml/min? Yes.    Patient's GFR today is >60 3. Is SCr </= 2? Yes.   Patient's SCr is 0.58 mg/dL 4. Did SCr increase >/= 0.5 in 24 hours? No. 5.Pt's weight >40kg  Yes.   6. Abnormal electrolyte(s): Phos, K, Mag  7. Electrolytes replaced per protocol 8.  Call MD STAT for K+ </= 2.5, Phos </= 1, or Mag </= 1 Physician:  Sandra Miller 04/02/2023 4:59 AM

## 2023-04-02 NOTE — Progress Notes (Signed)
Attempted to call report x2 to 2W with no response. Will attempt again.   Care ongoing.   Harriett Sine, RN

## 2023-04-03 DIAGNOSIS — R569 Unspecified convulsions: Secondary | ICD-10-CM | POA: Diagnosis not present

## 2023-04-03 LAB — GLUCOSE, CAPILLARY
Glucose-Capillary: 142 mg/dL — ABNORMAL HIGH (ref 70–99)
Glucose-Capillary: 98 mg/dL (ref 70–99)
Glucose-Capillary: 169 mg/dL — ABNORMAL HIGH (ref 70–99)
Glucose-Capillary: 129 mg/dL — ABNORMAL HIGH (ref 70–99)
Glucose-Capillary: 117 mg/dL — ABNORMAL HIGH (ref 70–99)

## 2023-04-03 LAB — CBC
HCT: 35.4 % — ABNORMAL LOW (ref 36.0–46.0)
Hemoglobin: 11.4 g/dL — ABNORMAL LOW (ref 12.0–15.0)
MCH: 28.9 pg (ref 26.0–34.0)
MCHC: 32.2 g/dL (ref 30.0–36.0)
MCV: 89.8 fL (ref 80.0–100.0)
Platelets: 230 10*3/uL (ref 150–400)
RBC: 3.94 MIL/uL (ref 3.87–5.11)
RDW: 13.6 % (ref 11.5–15.5)
WBC: 12.1 10*3/uL — ABNORMAL HIGH (ref 4.0–10.5)
nRBC: 0 % (ref 0.0–0.2)

## 2023-04-03 LAB — CK: Total CK: 9175 U/L — ABNORMAL HIGH (ref 38–234)

## 2023-04-03 MED ORDER — SODIUM CHLORIDE 0.9 % IV SOLN: INTRAVENOUS | Status: DC

## 2023-04-03 MED ORDER — POTASSIUM CHLORIDE 10 MEQ/100ML IV SOLN
10.0000 meq | Freq: Once | INTRAVENOUS | Status: AC
  Administered 2023-04-03: 10 meq via INTRAVENOUS
  Filled 2023-04-03: qty 100

## 2023-04-03 MED ORDER — POTASSIUM CHLORIDE 20 MEQ PO PACK
40.0000 meq | PACK | ORAL | Status: AC
  Administered 2023-04-03 (×2): 40 meq via ORAL
  Filled 2023-04-03 (×2): qty 2

## 2023-04-03 NOTE — Hospital Course (Addendum)
70 yof with history of tobacco use breast and vulvar cancers s/p chemo xrt and hysterectomy on letrozole, hypertension, IDA, Graves' disease, anxiety, bipolar, hiatal hernia TMJ was found minimally responsive on the floor by her daughter  on 03/31/23  after 915am.Daughter reports patient has had intermittent nausea and vomiting being treated with phenergan for months but worse over the last week with poor PO intake.No reported weight loss.  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  No previous history of seizures  Pt called her daughter asking her to come over,didn't feel well.  Daughter found her on the floor less than 30 mins later.  Witnessed seizure, then 2 additional seizures with EMS. In ED>had rightward gaze and remained minimally responsive with additional two seizures in ER.  Neurology was consulted emergently.  CTH showed 1.6 x 0.5 cm hyperdense extra-axial focus over left frontal lobe concerning for either SDH with fall vs mass, cervical CT neg for acute fracture.  Loaded with keppra and treated with ativan.  Labs significant for WBC 25.8, K 3.4, sCr 1, Mag 0.8, phos 5.4, calcium 10.6, AST 47, TSH 4.7, glucose 316, AG 33.  Patient was admitted to ICU 7/26 presented to the ED with concern for status, intubated  7/27: MRI negative for acute intracranial process.  Negative CSF pathogen assay.  CSF analysis not consistent with infection or inflammation Overnight EEG with video-assisted for mild to moderate diffuse encephalopathy nonspecific etiology no seizure or epileptiform discharges seen-LTM EEG discontinued 7/27 Patient transferred to [facility] 7/29> repeat lab showed worsening CK level patient was placed on IV fluid hydration-CK trended renal function remained stable overall mobilizing well mental status  better PT OT suggesting outpatient PT OT at this time she is stable

## 2023-04-03 NOTE — Progress Notes (Signed)
Ok to hold Crestor due to rising CK per Dr. Angus Seller, PharmD, BCIDP, AAHIVP, CPP Infectious Disease Pharmacist 04/03/2023 2:05 PM

## 2023-04-03 NOTE — Plan of Care (Signed)
  Problem: Activity: Goal: Ability to tolerate increased activity will improve Outcome: Progressing   Problem: Respiratory: Goal: Ability to maintain a clear airway and adequate ventilation will improve Outcome: Progressing   Problem: Role Relationship: Goal: Method of communication will improve Outcome: Progressing   Problem: Education: Goal: Ability to describe self-care measures that may prevent or decrease complications (Diabetes Survival Skills Education) will improve Outcome: Progressing Goal: Individualized Educational Video(s) Outcome: Progressing   Problem: Coping: Goal: Ability to adjust to condition or change in health will improve Outcome: Progressing   Problem: Fluid Volume: Goal: Ability to maintain a balanced intake and output will improve Outcome: Progressing   Problem: Health Behavior/Discharge Planning: Goal: Ability to identify and utilize available resources and services will improve Outcome: Progressing Goal: Ability to manage health-related needs will improve Outcome: Progressing   Problem: Metabolic: Goal: Ability to maintain appropriate glucose levels will improve Outcome: Progressing   Problem: Nutritional: Goal: Maintenance of adequate nutrition will improve Outcome: Progressing Goal: Progress toward achieving an optimal weight will improve Outcome: Progressing   Problem: Skin Integrity: Goal: Risk for impaired skin integrity will decrease Outcome: Progressing   Problem: Tissue Perfusion: Goal: Adequacy of tissue perfusion will improve Outcome: Progressing   Problem: Safety: Goal: Non-violent Restraint(s) Outcome: Progressing   Problem: Education: Goal: Knowledge of General Education information will improve Description: Including pain rating scale, medication(s)/side effects and non-pharmacologic comfort measures Outcome: Progressing   Problem: Health Behavior/Discharge Planning: Goal: Ability to manage health-related needs will  improve Outcome: Progressing   Problem: Clinical Measurements: Goal: Ability to maintain clinical measurements within normal limits will improve Outcome: Progressing Goal: Will remain free from infection Outcome: Progressing Goal: Diagnostic test results will improve Outcome: Progressing Goal: Respiratory complications will improve Outcome: Progressing Goal: Cardiovascular complication will be avoided Outcome: Progressing   Problem: Activity: Goal: Risk for activity intolerance will decrease Outcome: Progressing   Problem: Nutrition: Goal: Adequate nutrition will be maintained Outcome: Progressing   Problem: Coping: Goal: Level of anxiety will decrease Outcome: Progressing   Problem: Elimination: Goal: Will not experience complications related to bowel motility Outcome: Progressing Goal: Will not experience complications related to urinary retention Outcome: Progressing   Problem: Pain Managment: Goal: General experience of comfort will improve Outcome: Progressing   Problem: Safety: Goal: Ability to remain free from injury will improve Outcome: Progressing   Problem: Skin Integrity: Goal: Risk for impaired skin integrity will decrease Outcome: Progressing

## 2023-04-03 NOTE — Progress Notes (Signed)
PROGRESS NOTE Sandra Miller  ZOX:[id number] DOB: 10-22-1975 DOA: 03/31/2023 PCP: Marcine Matar, MD  Brief Narrative/Hospital Course: 1 yof with history of tobacco use breast and vulvar cancers s/p chemo xrt and hysterectomy on letrozole, hypertension, IDA, Graves' disease, anxiety, bipolar, hiatal hernia TMJ was found minimally responsive on the floor by her daughter  on 03/31/23  after 915am.Daughter reports patient has had intermittent nausea and vomiting being treated with phenergan for months but worse over the last week with poor PO intake.No reported weight loss.  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  No previous history of seizures  Pt called her daughter asking her to come over,didn't feel well.  Daughter found her on the floor less than 30 mins later.  Witnessed seizure, then 2 additional seizures with EMS. In ED>had rightward gaze and remained minimally responsive with additional two seizures in ER.  Neurology was consulted emergently.  CTH showed 1.6 x 0.5 cm hyperdense extra-axial focus over left frontal lobe concerning for either SDH with fall vs mass, cervical CT neg for acute fracture.  Loaded with keppra and treated with ativan.  Labs significant for WBC 25.8, K 3.4, sCr 1, Mag 0.8, phos 5.4, calcium 10.6, AST 47, TSH 4.7, glucose 316, AG 33.  Patient was admitted to ICU  7/26 presented to the ED with concern for status, intubated  7/27: MRI negative for acute intracranial process.  Negative CSF pathogen assay.  CSF analysis not consistent with infection or inflammation Overnight EEG with video-assisted for mild to moderate diffuse encephalopathy nonspecific etiology no seizure or epileptiform discharges seen-LTM EEG discontinued 7/27 Patient transferred to [facility] 7/29      Subjective: Patient seen and examined this morning family at bedside No new complaints Alert awake oriented resting comfortably ate food this morning Labs this morning with hypokalemia CK uptrending WBC downtrending   Assessment and Plan: Principal Problem:   Seizure (HCC) Active Problems:   Respiratory depression   Status epilepticus (HCC)   New onset seizure Status post fall Acute encephalopathy likely postictal: Etiology unclear-seizure may have been caused by syncopal episode from hypotension from dehydration, as patient was having frequent nausea vomiting and poor oral intake.  Labs also had hypokalemia.  Seen by neurology EEG no seizure, managed with Keppra> continue the same.  Patient completed neuro Imaging and CSF studies unremarkable.  Neurology signed off,. I informed patient not to drive x 6 months until seizure-free-I messaged neurology regarding their input on driving restriction. She will need follow-up with neurology for further recommendation   Hypomagnesemia Hypokalemia: replete po.  Intermittent nausea vomiting and poor oral for months: being managed by PCP on Phenergan, supportive care.  Tolerated diet this morning well, abdominal exam is benign   Mild rhabdomyolysis due to fall. [id number] on admission recheck CK level in 9k> start IV fluids continue overnight Recent Labs  Lab 03/31/23 1852 03/31/23 2148 04/01/23 0634 04/03/23 0647  CKTOTAL 2,723* 3,472* 3,951* 9,175*    Leukocytosis resolved  High anion gap metabolic acidosis/lactic acidosis: In the setting of seizure.  Electrolytes are stabilized.  Acute respiratory insufficiency needing intubation for airway protection and postictal state  Prior history of breast and vulvar cancer  status post chemoradiation and surgery continue home letrozole  Hypertension: BP stable on lisinopril, amlodipine, Aldactone  Hyperthyroidism-continue Tapazole  HLD  continue Crestor  Anxiety disorder continue Prozac,  Neurontin, gabapentin.  Class 1 Obesity:Patient's Body mass index is 32.25 kg/m. : Will benefit with PCP follow-up, weight loss  healthy lifestyle and outpatient sleep evaluation.   DVT prophylaxis: SCDs Start: 03/31/23 1407 Code Status:   Code Status: Full Code Family Communication: plan of care discussed with patient at bedside. Patient status is: Inpatient because of rhabdomyolysis Level of care: Med-Surg   Dispo: The patient is from: home            Anticipated disposition: home if CK better Objective: Vitals last 24 hrs: Vitals:   04/03/23 0500 04/03/23 0730 04/03/23 1049 04/03/23 1156  BP:  121/66 101/73 108/75  Pulse:  80 (!) 103 (!) 102  Resp:  17 16 17   Temp:  97.8 F (36.6 C)  99.4 F (37.4 C)  TempSrc:    Oral  SpO2:  96% 95% 100%  Weight: 87.9 kg     Height:       Weight change: 0.3 kg  Physical Examination: General exam: alert awake, older than stated age HEENT:Oral mucosa moist, Ear/Nose WNL grossly Respiratory system: bilaterally clear BS, no use of accessory muscle Cardiovascular system: S1 & S2 +, No JVD. Gastrointestinal system: Abdomen soft,NT,ND, BS+ Nervous System:Alert, awake, moving extremities. Extremities: LE edema neg,distal peripheral pulses palpable.  Skin: No rashes,no icterus. MSK: Normal muscle bulk,tone, power  Medications reviewed:  Scheduled Meds:  amLODipine  5 mg Oral Daily   Chlorhexidine Gluconate Cloth  6 each Topical Q0600   docusate sodium  100 mg Oral BID   FLUoxetine  30 mg Oral Daily   gabapentin  300 mg Oral Daily   insulin aspart  0-20 Units Subcutaneous TID WC   letrozole  2.5 mg Oral Daily   levETIRAcetam  750 mg Oral BID   lisinopril  10 mg Oral Daily   LORazepam  1 mg Oral TID   methimazole  5 mg Oral Daily   OLANZapine  15 mg Oral QHS   pantoprazole  40 mg Oral Daily   rosuvastatin  10 mg Oral Daily   spironolactone  12.5 mg Oral Daily   Continuous Infusions:  sodium chloride 125 mL/hr at 04/03/23 1240       Diet Order             Diet regular Room service appropriate? Yes with Assist; Fluid consistency: Thin  Diet effective now                            Intake/Output Summary (Last 24 hours) at 04/03/2023 1321 Last data filed at 04/03/2023 0857 Gross per 24 hour  Intake 240 ml  Output 850 ml  Net -610 ml   Net IO Since Admission: 3,763.73 mL [04/03/23 1321]  Wt Readings from Last 3 Encounters:  04/03/23 87.9 kg  03/06/23 86.2 kg  03/02/23 88.5 kg     Unresulted Labs (From admission, onward)     Start     Ordered   04/04/23 0500  CK  Tomorrow morning,   R       Question:  Specimen collection method  Answer:  Lab=Lab collect   04/03/23 1118   04/03/23 0500  Basic metabolic panel  Daily,   R     Question:  Specimen collection method  Answer:  Lab=Lab collect   04/02/23 0935           Data Reviewed: I have personally reviewed following  labs and imaging studies CBC: Recent Labs  Lab 03/31/23 1103 03/31/23 1704 04/01/23 0630 04/01/23 0743 04/02/23 0358 04/03/23 0647  WBC 25.8*  --   --  17.6* 16.5* 12.1*  NEUTROABS 21.5*  --   --   --   --   --   HGB 15.1* 14.6 11.6* 11.1* 13.2 11.4*  HCT 49.0* 43.0 34.0* 32.5* 40.0 35.4*  MCV 97.2  --   --  89.0 88.3 89.8  PLT 455*  --   --  249 261 230   Basic Metabolic Panel: Recent Labs  Lab 03/31/23 1112 03/31/23 1704 03/31/23 2148 04/01/23 0630 04/01/23 0634 04/02/23 0358 04/03/23 0647  NA 143   < > 142 141 141 139 140  K 3.4*   < > 2.9* 2.7* 2.9* 2.8* 3.2*  CL 100  --  103  --  107 102 107  CO2 10*  --  22  --  21* 23 23  GLUCOSE 316*  --  111*  --  116* 105* 108*  BUN 10  --  12  --  12 5* <5*  CREATININE 1.00  --  0.82  --  0.69 0.58 0.57  CALCIUM 10.6*  --  8.8*  --  8.2* 9.1 8.6*  MG 0.8*  --  2.1  --  1.7 1.8  --   PHOS 5.4*  --   --   --  2.3* 1.9*  --    < > = values in this interval not displayed.   GFR: Estimated Creatinine Clearance: 93.2 mL/min (by C-G formula based on SCr of 0.57  mg/dL). Liver Function Tests: Recent Labs  Lab 03/31/23 1112 03/31/23 2147 03/31/23 2148  AST 47* 86* 85*  ALT 32 30 34  ALKPHOS 56 42 40  BILITOT 0.8 0.6 1.1  PROT 8.3* 6.7 6.5  ALBUMIN 4.7 3.4* 3.5   No results for input(s): "LIPASE", "AMYLASE" in the last 168 hours. No results for input(s): "AMMONIA" in the last 168 hours. Coagulation Profile: Recent Labs  Lab 03/31/23 1852 03/31/23 2147  INR >10.0* 1.1   BNP (last 3 results) No results for input(s): "PROBNP" in the last 8760 hours. HbA1C: No results for input(s): "HGBA1C" in the last 72 hours. CBG: Recent Labs  Lab 04/02/23 1602 04/02/23 2130 04/03/23 0841 04/03/23 1129 04/03/23 1235  GLUCAP 148* 115* 142* 98 169*   Lipid Profile: No results for input(s): "CHOL", "HDL", "LDLCALC", "TRIG", "CHOLHDL", "LDLDIRECT" in the last 72 hours. Thyroid Function Tests: Recent Labs    03/31/23 1852  FREET4 1.11   Sepsis Labs: No results for input(s): "PROCALCITON", "LATICACIDVEN" in the last 168 hours.  Recent Results (from the past 240 hour(s))  MRSA Next Gen by PCR, Nasal     Status: None   Collection Time: 03/31/23  5:39 PM   Specimen: Nasal Mucosa; Nasal Swab  Result Value Ref Range Status   MRSA by PCR Next Gen NOT DETECTED NOT DETECTED Final    Comment: (NOTE) The GeneXpert MRSA Assay (FDA approved for NASAL specimens only), is one component of a comprehensive MRSA colonization surveillance program. It is not intended to diagnose MRSA infection nor to guide or monitor treatment for MRSA infections. Test performance is not FDA approved in patients less than 38 years old. Performed at [facility], [address] [place], [place] [address]   CSF culture w Gram Stain     Status: None   Collection Time: 03/31/23  6:08 PM   Specimen: CSF;  Cerebrospinal Fluid  Result Value Ref Range Status   Specimen Description CSF  Final   Special Requests LP  Final   Gram Stain NO WBC SEEN NO ORGANISMS SEEN    Final   Culture   Final    NO GROWTH 3 DAYS Performed at [facility], [address], [place], [place] [address]    Report Status 04/03/2023 FINAL  Final  Culture, blood (Routine X 2) w Reflex to ID Panel     Status: None (Preliminary result)   Collection Time: 03/31/23  6:52 PM   Specimen: BLOOD RIGHT ARM  Result Value Ref Range Status   Specimen Description BLOOD RIGHT ARM  Final   Special Requests   Final    BOTTLES DRAWN AEROBIC ONLY Blood Culture results may not be optimal due to an inadequate volume of blood received in culture bottles   Culture   Final    NO GROWTH 2 DAYS Performed at [facility], [address] [place], [place] [address]    Report Status PENDING  Incomplete  Culture, blood (Routine X 2) w Reflex to ID Panel     Status: None (Preliminary result)   Collection Time: 03/31/23  7:02 PM   Specimen: BLOOD RIGHT HAND  Result Value Ref Range Status   Specimen Description BLOOD RIGHT HAND  Final   Special Requests   Final    BOTTLES DRAWN AEROBIC ONLY Blood Culture adequate volume   Culture   Final    NO GROWTH 2 DAYS Performed at [facility], [address], [place], [place] [address]    Report Status PENDING  Incomplete  Urine Culture     Status: None   Collection Time: 04/01/23 12:47 AM   Specimen: Urine, Random  Result Value Ref Range Status   Specimen Description URINE, RANDOM  Final   Special Requests NONE Reflexed from [id number]  Final   Culture   Final    NO GROWTH Performed at [facility], [address] [place], [place] [address]    Report Status 04/02/2023 FINAL  Final    Antimicrobials: Anti-infectives (From admission, onward)    Start     Dose/Rate Route Frequency Ordered Stop   04/01/23 0600  cefTRIAXone (ROCEPHIN) 2 g in sodium chloride 0.9 % 100 mL IVPB  Status:  Discontinued        2 g 200 mL/hr over 30 Minutes Intravenous Every 12 hours 03/31/23 2117 04/01/23 0910   04/01/23 0400  vancomycin (VANCOCIN) IVPB  1000 mg/200 mL premix  Status:  Discontinued       Placed in "Followed by" Linked Group   1,000 mg 200 mL/hr over 60 Minutes Intravenous Every 12 hours 03/31/23 1553 04/01/23 0910   04/01/23 0300  acyclovir (ZOVIRAX) 685 mg in dextrose 5 % 100 mL IVPB  Status:  Discontinued        10 mg/kg  68.7 kg (Adjusted) 113.7 mL/hr over 60 Minutes Intravenous Every 8 hours 03/31/23 2117 04/01/23 0910   03/31/23 1600  vancomycin (VANCOREADY) IVPB 1750 mg/350 mL       Placed in "Followed by" Linked Group   1,750 mg 175 mL/hr over 120 Minutes Intravenous  Once 03/31/23 1553 03/31/23 2204   03/31/23 1600  acyclovir (ZOVIRAX) 685 mg in dextrose 5 % 100 mL IVPB  Status:  Discontinued        10 mg/kg  68.7 kg (Adjusted) 113.7 mL/hr over 60 Minutes Intravenous Every 8 hours  03/31/23 1553 03/31/23 2117   03/31/23 1545  cefTRIAXone (ROCEPHIN) 2 g in sodium chloride 0.9 % 100 mL IVPB  Status:  Discontinued        2 g 200 mL/hr over 30 Minutes Intravenous Every 12 hours 03/31/23 1533 03/31/23 2117      Culture/Microbiology    Component Value Date/Time   SDES URINE, RANDOM 04/01/2023 0047   SPECREQUEST NONE Reflexed from [id number] 04/01/2023 0047   CULT  04/01/2023 0047    NO GROWTH Performed at [facility], [address], [place], [place] [address]    REPTSTATUS 04/02/2023 FINAL 04/01/2023 0047    Other culture-see note  Radiology Studies: CT VENOGRAM HEAD  Result Date: 04/01/2023 CLINICAL DATA:  Provided history: Dural venous sinus thrombosis suspected. EXAM: CT VENOGRAM HEAD TECHNIQUE: Venographic phase images of the brain were obtained following the administration of intravenous contrast. Multiplanar reformats and maximum intensity projections were generated. RADIATION DOSE REDUCTION: This exam was performed according to the departmental dose-optimization program which includes automated exposure control, adjustment of the mA and/or kV according to patient size and/or use of iterative  reconstruction technique. CONTRAST:  75mL OMNIPAQUE IOHEXOL 350 MG/ML SOLN COMPARISON:  Head CT 03/31/2023.  Brain MRI 03/31/2023. FINDINGS: The superior sagittal sinus, internal cerebral veins, vein of Galen, straight sinus, transverse sinuses, sigmoid sinuses and visualized jugular veins are patent. No evidence of dural venous sinus thrombosis. No pathologic parenchymal enhancement is identified. IMPRESSION: No evidence of dural venous sinus thrombosis. Electronically Signed   By: Jackey Loge D.O.   On: 04/01/2023 14:33   CT ANGIO HEAD W OR WO CONTRAST  Result Date: 04/01/2023 CLINICAL DATA:  AVM/AVF, high flow vascular malformation. Compared to previous CT. EXAM: CT ANGIOGRAPHY HEAD TECHNIQUE: Multidetector CT imaging of the head was performed using the standard protocol during bolus administration of intravenous contrast. Multiplanar CT image reconstructions and MIPs were obtained to evaluate the vascular anatomy. RADIATION DOSE REDUCTION: This exam was performed according to the departmental dose-optimization program which includes automated exposure control, adjustment of the mA and/or kV according to patient size and/or use of iterative reconstruction technique. CONTRAST:  75mL OMNIPAQUE IOHEXOL 350 MG/ML SOLN COMPARISON:  Brain MRI 03/31/2023. Head CT 03/31/2023. CT angiogram head 06/25/2022. FINDINGS: CT HEAD Brain: No age advanced or lobar predominant parenchymal atrophy. 1.6 x 0.5 cm extra-axial focus of hyperdensity overlying the high left frontal lobe, unchanged in size from the prior head CT of 03/31/2023. This is favored to reflect a small acute extra-axial hemorrhage given the absence of an enhancing mass at this site on the prior brain MRI of 03/31/2023. Contact upon the underlying brain parenchyma, as before. Partially empty sella turcica. No new intracranial hemorrhage is identified elsewhere. No demarcated cortical infarct. No evidence of an intracranial mass. No midline shift. Vascular: No  hyperdense vessel.  Atherosclerotic calcifications. Skull: No calvarial fracture or aggressive osseous lesion. Orbits/Sinuses: No mass or acute finding within the imaged orbits. Redemonstrated chronic medially displaced fracture deformity of the left lamina papyracea. Mild mucosal thickening scattered within bilateral ethmoid air cells. Partially imaged mucous retention cyst within the left maxillary sinus. Other: Small volume fluid within the right mastoid air cells. CTA HEAD Please note, due to malfunction of the CT scanner, the cephalad portion of the head (at the level of the high frontoparietal lobes) is excluded from the field of view. Within this limitation, findings are as follows. Anterior circulation: The intracranial internal carotid arteries are patent. Minimal nonstenotic atherosclerotic plaque within the  intracranial left ICA. The M1 middle cerebral arteries are patent. No M2 proximal branch occlusion or high-grade proximal stenosis. The anterior cerebral arteries are patent. No intracranial aneurysm is identified. No evidence of AVM at the imaged levels. Posterior circulation: The intracranial vertebral arteries are patent. The basilar artery is patent. The posterior cerebral arteries are patent. A left posterior communicating artery is present. The right posterior communicating artery is diminutive or absent. Venous sinuses: No evidence of a dural venous sinus thrombosis at the imaged levels. Anatomic variants: As described. Review of the MIP images confirms the above findings. IMPRESSION: Non-contrast head CT: 1. 1.6 x 0.5 cm extra-axial hyperdense focus overlying the high left frontal lobe, stable in size as compared to the head CT of 03/31/2023. This likely reflects a small acute extra-axial hemorrhage (given the lack of an enhancing mass at this site on the brain MRI of 03/31/2023). Contact upon the underlying brain parenchyma, as before. 2. No evidence of an interval acute intracranial  abnormality. 3. Partially empty sella turcica. This finding can reflect incidental anatomic variation, or alternatively, it can be associated with idiopathic intracranial hypertension (pseudotumor cerebri). CTA head: 1. Please note due to malfunction of the CT scanner, the cephalad portion of the head (at the level of the high frontoparietal lobes) is excluded from the field of view. Within this limitation, findings are as follows. 2. No intracranial large vessel occlusion or proximal high-grade arterial stenosis. 3. No evidence of an AVM at the imaged levels. Electronically Signed   By: Jackey Loge D.O.   On: 04/01/2023 14:11     LOS: 3 days   Lanae Boast, MD [facility]  04/03/2023, 1:21 PM

## 2023-04-03 NOTE — Progress Notes (Signed)
Physical Therapy Treatment Patient Details Name: Sandra Miller MRN: [id number] DOB: [birth year] Today's Date: 04/03/2023   History of Present Illness 47 y.o. F adm 03/31/2023 with seizures, found down. CTH showed 1.6 x 0.5 cm hyperdense extra-axial focus over left frontal lobe concerning for either SDH with fall vs mass, cervical CT and head MRI (-). Intubated 7/26-7/27. PMH: breast and vulvar CA, HTN, IDA, Graves disease, anxiety/bipolar.    PT Comments  Pt pleasant and reports decreased memory and awareness of events PTA and since admission. Daughter present and confirms pt normally cognitively intact but she does struggle with frequent pain throughout her body since chemo. Pt today states pain in Rt groin as well as feet. Pt stating she struggles with getting up from toilet at home and work and difficulty clearing tub to get in shower as well. Pt reliant on RW as this time with DME updated and pt interested in OPPT to assist with pain management and activity progression.  Encouraged OOB to chair and toilet throughout the day.   If plan is discharge home, recommend the following: Assistance with cooking/housework;Assist for transportation;Help with stairs or ramp for entrance   Can travel by private vehicle        Equipment Recommendations  Rolling walker (2 wheels);BSC/3in1    Recommendations for Other Services       Precautions / Restrictions Precautions Precautions: Fall Restrictions Weight Bearing Restrictions: No     Mobility  Bed Mobility Overal bed mobility: Needs Assistance Bed Mobility: Supine to Sit     Supine to sit: Supervision     General bed mobility comments: supervision for IV, increased time    Transfers Overall transfer level: Needs assistance   Transfers: Sit to/from Stand Sit to Stand: Supervision           General transfer comment: supervision for lines and safety    Ambulation/Gait Ambulation/Gait assistance: Supervision Gait Distance  (Feet): 150 Feet Assistive device: Rolling walker (2 wheels) Gait Pattern/deviations: Step-through pattern, Decreased stride length, Trunk flexed   Gait velocity interpretation: 1.31 - 2.62 ft/sec, indicative of limited community ambulator   General Gait Details: pt with decreased stance on RLE stating pain, flexed trunk with cues for upright and offer to increase RW height but pt declined   Stairs             Wheelchair Mobility     Tilt Bed    Modified Rankin (Stroke Patients Only)       Balance Overall balance assessment: Mild deficits observed, not formally tested                                          Cognition Arousal/Alertness: Awake/alert Behavior During Therapy: Flat affect Overall Cognitive Status: Impaired/Different from baseline Area of Impairment: Memory, Safety/judgement, Problem solving, Orientation                 Orientation Level: Disoriented to, Time   Memory: Decreased short-term memory   Safety/Judgement: Decreased awareness of deficits, Decreased awareness of safety   Problem Solving: Requires verbal cues General Comments: pt apparently walked this am per daughter but pt does not recall not does she have any recollection of events leading to admission        Exercises      General Comments        Pertinent Vitals/Pain Pain Assessment Faces Pain Scale: Hurts even  more Pain Location: Rt hand at IV and rt groin Pain Descriptors / Indicators: Aching Pain Intervention(s): Limited activity within patient's tolerance, Monitored during session, Repositioned    Home Living                          Prior Function            PT Goals (current goals can now be found in the care plan section) Progress towards PT goals: Progressing toward goals    Frequency    Min 1X/week      PT Plan Discharge plan needs to be updated;Equipment recommendations need to be updated    Co-evaluation               AM-PAC PT "6 Clicks" Mobility   Outcome Measure  Help needed turning from your back to your side while in a flat bed without using bedrails?: None Help needed moving from lying on your back to sitting on the side of a flat bed without using bedrails?: None Help needed moving to and from a bed to a chair (including a wheelchair)?: A Little Help needed standing up from a chair using your arms (e.g., wheelchair or bedside chair)?: A Little Help needed to walk in hospital room?: A Little Help needed climbing 3-5 steps with a railing? : A Lot 6 Click Score: 19    End of Session   Activity Tolerance: Patient tolerated treatment well Patient left: in chair;with call bell/phone within reach Nurse Communication: Mobility status PT Visit Diagnosis: Unsteadiness on feet (R26.81);Other abnormalities of gait and mobility (R26.89)     Time: 1610-9604 PT Time Calculation (min) (ACUTE ONLY): 23 min  Charges:    $Gait Training: 8-22 mins $Therapeutic Activity: 8-22 mins PT General Charges $$ ACUTE PT VISIT: 1 Visit                     Merryl Hacker, PT Acute Rehabilitation Services Office: [phone]    Sandra Miller 04/03/2023, 11:52 AM

## 2023-04-03 NOTE — Progress Notes (Signed)
Transition of Care [facility]) - Inpatient Brief Assessment   Patient Details  Name: ERIAL ANGERMEIER MRN: [id number] Date of Birth: [birth year]  Transition of Care [facility]) CM/SW Contact:    Janae Bridgeman, RN Phone Number: 04/03/2023, 2:12 PM   Clinical Narrative: CM met with the patient and daughter at the bedside to discuss TOC needs.  The patient was admitted for seizures and plans to return home with mother when she is medically stable to discharge.  OUtpatient PT needs discussed with the patient and Referral placed for OUtpatient PT services and follow up placed in the AVS.  Patient needs RW and 3:1.  Orders placed and Jermaine, CM with [employer] called to deliver DME to the hospital room before patient is discharged in the next 1-2 days.   Transition of Care Asessment: Insurance and Status: (P) Insurance coverage has been reviewed Patient has primary care physician: (P) Yes Home environment has been reviewed: (P) Yes - lives at home with her mother Prior level of function:: (P) Independent Prior/Current Home Services: (P) No current home services Social Determinants of Health Reivew: (P) SDOH reviewed interventions complete Readmission risk has been reviewed: (P) Yes Transition of care needs: (P) transition of care needs identified, TOC will continue to follow

## 2023-04-04 ENCOUNTER — Encounter (HOSPITAL_COMMUNITY): Payer: Self-pay | Admitting: Internal Medicine

## 2023-04-04 DIAGNOSIS — R569 Unspecified convulsions: Secondary | ICD-10-CM | POA: Diagnosis not present

## 2023-04-04 LAB — GLUCOSE, CAPILLARY
Glucose-Capillary: 94 mg/dL (ref 70–99)
Glucose-Capillary: 153 mg/dL — ABNORMAL HIGH (ref 70–99)
Glucose-Capillary: 100 mg/dL — ABNORMAL HIGH (ref 70–99)
Glucose-Capillary: 90 mg/dL (ref 70–99)

## 2023-04-04 NOTE — Progress Notes (Signed)
PROGRESS NOTE Sandra Miller  QMV:[id number] DOB: December 31, 1975 DOA: 03/31/2023 PCP: Marcine Matar, MD  Brief Narrative/Hospital Course: 47 yof with history of tobacco use breast and vulvar cancers s/p chemo xrt and hysterectomy on letrozole, hypertension, IDA, Graves' disease, anxiety, bipolar, hiatal hernia TMJ was found minimally responsive on the floor by her daughter  on 03/31/23  after 915am.Daughter reports patient has had intermittent nausea and vomiting being treated with phenergan for months but worse over the last week with poor PO intake.No reported weight loss.  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  No previous history of seizures  Pt called her daughter asking her to come over,didn't feel well.  Daughter found her on the floor less than 30 mins later.  Witnessed seizure, then 2 additional seizures with EMS. In ED>had rightward gaze and remained minimally responsive with additional two seizures in ER.  Neurology was consulted emergently.  CTH showed 1.6 x 0.5 cm hyperdense extra-axial focus over left frontal lobe concerning for either SDH with fall vs mass, cervical CT neg for acute fracture.  Loaded with keppra and treated with ativan.  Labs significant for WBC 25.8, K 3.4, sCr 1, Mag 0.8, phos 5.4, calcium 10.6, AST 47, TSH 4.7, glucose 316, AG 33.  Patient was admitted to ICU  7/26 presented to the ED with concern for status, intubated  7/27: MRI negative for acute intracranial process.  Negative CSF pathogen assay.  CSF analysis not consistent with infection or inflammation Overnight EEG with video-assisted for mild to moderate diffuse encephalopathy nonspecific etiology no seizure or epileptiform discharges seen-LTM EEG discontinued 7/27 Patient transferred to [facility] 7/29    Subjective: Patient seen and examined Resting comfortably Son at the bedside who states she is slowly becoming herself Overnight afebrile not hypoxic CK this am is up at 11k  Assessment and Plan: Principal Problem:   Seizure [facility]) Active Problems:   Respiratory depression   Status epilepticus (HCC)   New onset seizure Status post fall Acute encephalopathy likely postictal: Etiology unclear-seizure may have been caused by syncopal episode from hypotension from dehydration, as patient was having frequent nausea vomiting and poor oral intake.  Labs also had hypokalemia.  Seen by neurology EEG no seizure, managed with Keppra> continue the same.  Patient completed neuro Imaging and CSF studies unremarkable.  Neurology signed off,. I discussed w/ neuro 7/29> since this is a provoked seizure advised to continue driving restriction until cleared by neurology on next outpatient follow-up and may not need 6 month restrictions-patient and patient's son aware  Hypomagnesemia/Hypokalemia: Resolved  Intermittent nausea vomiting and poor oral for months: being managed by PCP on Phenergan. Cont supportive care.Tolerating diet. Abdominal exam is benign   Rhabdomyolysis due to fall: CK is uptrending we will increase IV fluid hydration , renal function stable, cont to trend CK Recent Labs  Lab 03/31/23 1852 03/31/23 2148 04/01/23 0634 04/03/23 0647 04/03/23 2332  CKTOTAL 2,723* 3,472* 3,951* 9,175* 11,408*    Leukocytosis: resolved  High anion gap metabolic acidosis/lactic acidosis: In the setting of seizure.  Electrolytes are stabilized.  Acute respiratory insufficiency: needing intubation for airway protection and postictal state. On RA now.  Prior history of breast and vulvar cancer  S/p chemoradiation and surgery continue home letrozole  Hypertension: BP stable on lisinopril, amlodipine, Aldactone  Hyperthyroidism:  Continue Tapazole  HLD: holding Crestor due to rising  CK.  Anxiety disorder: continue Prozac, Neurontin, gabapentin.  Class 1 Obesity:Patient's Body mass index is 32.25 kg/m.: Will benefit with PCP follow-up, weight loss  healthy lifestyle and outpatient sleep evaluation.   DVT prophylaxis: SCDs Start: 03/31/23 1407 Code Status:   Code Status: Full Code Family Communication: plan of care discussed with patient at bedside. Patient status is: Inpatient because of rhabdomyolysis Level of care: Med-Surg   Dispo: The patient is from:Home            Anticipated disposition:Home  tomorrow if CK better. Objective: Vitals last 24 hrs: Vitals:   04/03/23 2013 04/04/23 0520 04/04/23 0759 04/04/23 1059  BP: 104/64 125/79 128/84 101/61  Pulse: 96 95 90 85  Resp: 18 16    Temp: 98 F (36.7 C) 98.1 F (36.7 C)    TempSrc: Oral Oral    SpO2: 99% 96% 100%   Weight:  87.9 kg    Height:       Weight change: 0 kg  Physical Examination: General exam: alert awake,older than stated age HEENT:Oral mucosa moist, Ear/Nose WNL grossly Respiratory system: Bilaterally clear BS,no use of accessory muscle Cardiovascular system: S1 & S2 +, No JVD. Gastrointestinal system: Abdomen soft,NT,ND, BS+ Nervous System: Alert, awake, moving all extremities,and following commands. Extremities: LE edema neg,distal peripheral pulses palpable and warm.  Skin: No rashes,no icterus. MSK: Normal muscle bulk,tone, power   Medications reviewed:  Scheduled Meds:  amLODipine  5 mg Oral Daily   Chlorhexidine Gluconate Cloth  6 each Topical Q0600   docusate sodium  100 mg Oral BID   FLUoxetine  30 mg Oral Daily   gabapentin  300 mg Oral Daily   insulin aspart  0-20 Units Subcutaneous TID WC   letrozole  2.5 mg Oral Daily   levETIRAcetam  750 mg Oral BID   lisinopril  10 mg Oral Daily   LORazepam  1 mg Oral TID   methimazole  5 mg Oral Daily   OLANZapine  15 mg Oral QHS   pantoprazole  40 mg Oral Daily   spironolactone  12.5 mg Oral Daily  Continuous  Infusions:  sodium chloride 150 mL/hr at 04/04/23 1233    Diet Order             Diet regular Room service appropriate? Yes with Assist; Fluid consistency: Thin  Diet effective now                  Intake/Output Summary (Last 24 hours) at 04/04/2023 1307 Last data filed at 04/04/2023 0847 Gross per 24 hour  Intake 2111.21 ml  Output --  Net 2111.21 ml   Net IO Since Admission: 6,234.94 mL [04/04/23 1307]  Wt Readings from Last 3 Encounters:  04/04/23 87.9 kg  03/06/23 86.2 kg  03/02/23 88.5 kg   Unresulted Labs (From admission, onward)     Start     Ordered   04/05/23 0500  CK  Daily,   R     Question:  Specimen collection method  Answer:  Lab=Lab collect   04/04/23 0810   04/03/23 0500  Basic metabolic panel  Daily,   R     Question:  Specimen collection method  Answer:  Lab=Lab collect   04/02/23 0935          Data Reviewed: I have personally reviewed following labs and imaging studies CBC: Recent Labs  Lab 03/31/23 1103 03/31/23 1704 04/01/23 0630 04/01/23 0743 04/02/23  1610 04/03/23 0647  WBC 25.8*  --   --  17.6* 16.5* 12.1*  NEUTROABS 21.5*  --   --   --   --   --   HGB 15.1* 14.6 11.6* 11.1* 13.2 11.4*  HCT 49.0* 43.0 34.0* 32.5* 40.0 35.4*  MCV 97.2  --   --  89.0 88.3 89.8  PLT 455*  --   --  249 261 230  Basic Metabolic Panel: Recent Labs  Lab 03/31/23 1112 03/31/23 1704 03/31/23 2148 04/01/23 0630 04/01/23 0634 04/02/23 0358 04/03/23 0647 04/03/23 2332  NA 143   < > 142 141 141 139 140 141  K 3.4*   < > 2.9* 2.7* 2.9* 2.8* 3.2* 3.5  CL 100  --  103  --  107 102 107 105  CO2 10*  --  22  --  21* 23 23 23   GLUCOSE 316*  --  111*  --  116* 105* 108* 104*  BUN 10  --  12  --  12 5* <5* 5*  CREATININE 1.00  --  0.82  --  0.69 0.58 0.57 0.59  CALCIUM 10.6*  --  8.8*  --  8.2* 9.1 8.6* 8.6*  MG 0.8*  --  2.1  --  1.7 1.8  --   --   PHOS 5.4*  --   --   --  2.3* 1.9*  --   --    < > = values in this interval not displayed.    GFR: Estimated Creatinine Clearance: 93.2 mL/min (by C-G formula based on SCr of 0.59 mg/dL). Liver Function Tests: Recent Labs  Lab 03/31/23 1112 03/31/23 2147 03/31/23 2148  AST 47* 86* 85*  ALT 32 30 34  ALKPHOS 56 42 40  BILITOT 0.8 0.6 1.1  PROT 8.3* 6.7 6.5  ALBUMIN 4.7 3.4* 3.5   Recent Labs  Lab 03/31/23 1852 03/31/23 2147  INR >10.0* 1.1   Recent Labs  Lab 04/03/23 1235 04/03/23 1628 04/03/23 2126 04/04/23 0817 04/04/23 1229  GLUCAP 169* 129* 117* 94 153*   Recent Results (from the past 240 hour(s))  MRSA Next Gen by PCR, Nasal     Status: None   Collection Time: 03/31/23  5:39 PM   Specimen: Nasal Mucosa; Nasal Swab  Result Value Ref Range Status   MRSA by PCR Next Gen NOT DETECTED NOT DETECTED Final    Comment: (NOTE) The GeneXpert MRSA Assay (FDA approved for NASAL specimens only), is one component of a comprehensive MRSA colonization surveillance program. It is not intended to diagnose MRSA infection nor to guide or monitor treatment for MRSA infections. Test performance is not FDA approved in patients less than 28 years old. Performed at [facility], [address], [place], [place] [address]   CSF culture w Gram Stain     Status: None   Collection Time: 03/31/23  6:08 PM   Specimen: CSF; Cerebrospinal Fluid  Result Value Ref Range Status   Specimen Description CSF  Final   Special Requests LP  Final   Gram Stain NO WBC SEEN NO ORGANISMS SEEN   Final   Culture   Final    NO GROWTH 3 DAYS Performed at [facility], [address], [place], [place] [address]    Report Status 04/03/2023 FINAL  Final  Culture, blood (Routine X 2) w Reflex to ID Panel     Status: None (Preliminary result)   Collection Time: 03/31/23  6:52 PM  Specimen: BLOOD RIGHT ARM  Result Value Ref Range Status   Specimen Description BLOOD RIGHT ARM  Final   Special Requests   Final    BOTTLES DRAWN AEROBIC ONLY Blood Culture results may not be optimal  due to an inadequate volume of blood received in culture bottles   Culture   Final    NO GROWTH 4 DAYS Performed at [facility], [address], [place], [place] [address]    Report Status PENDING  Incomplete  Culture, blood (Routine X 2) w Reflex to ID Panel     Status: None (Preliminary result)   Collection Time: 03/31/23  7:02 PM   Specimen: BLOOD RIGHT HAND  Result Value Ref Range Status   Specimen Description BLOOD RIGHT HAND  Final   Special Requests   Final    BOTTLES DRAWN AEROBIC ONLY Blood Culture adequate volume   Culture   Final    NO GROWTH 4 DAYS Performed at [facility], [address], [place], [place] [address]    Report Status PENDING  Incomplete  Urine Culture     Status: None   Collection Time: 04/01/23 12:47 AM   Specimen: Urine, Random  Result Value Ref Range Status   Specimen Description URINE, RANDOM  Final   Special Requests NONE Reflexed from [id number]  Final   Culture   Final    NO GROWTH Performed at [facility], [address], [place], [place] [address]    Report Status 04/02/2023 FINAL  Final    Antimicrobials: Anti-infectives (From admission, onward)    Start     Dose/Rate Route Frequency Ordered Stop   04/01/23 0600  cefTRIAXone (ROCEPHIN) 2 g in sodium chloride 0.9 % 100 mL IVPB  Status:  Discontinued        2 g 200 mL/hr over 30 Minutes Intravenous Every 12 hours 03/31/23 2117 04/01/23 0910   04/01/23 0400  vancomycin (VANCOCIN) IVPB 1000 mg/200 mL premix  Status:  Discontinued       Placed in "Followed by" Linked Group   1,000 mg 200 mL/hr over 60 Minutes Intravenous Every 12 hours 03/31/23 1553 04/01/23 0910   04/01/23 0300  acyclovir (ZOVIRAX) 685 mg in dextrose 5 % 100 mL IVPB  Status:  Discontinued        10 mg/kg  68.7 kg (Adjusted) 113.7 mL/hr over 60 Minutes Intravenous Every 8 hours 03/31/23 2117 04/01/23 0910   03/31/23 1600  vancomycin (VANCOREADY) IVPB 1750 mg/350 mL       Placed in "Followed by" Linked  Group   1,750 mg 175 mL/hr over 120 Minutes Intravenous  Once 03/31/23 1553 03/31/23 2204   03/31/23 1600  acyclovir (ZOVIRAX) 685 mg in dextrose 5 % 100 mL IVPB  Status:  Discontinued        10 mg/kg  68.7 kg (Adjusted) 113.7 mL/hr over 60 Minutes Intravenous Every 8 hours 03/31/23 1553 03/31/23 2117   03/31/23 1545  cefTRIAXone (ROCEPHIN) 2 g in sodium chloride 0.9 % 100 mL IVPB  Status:  Discontinued        2 g 200 mL/hr over 30 Minutes Intravenous Every 12 hours 03/31/23 1533 03/31/23 2117      Culture/Microbiology    Component Value Date/Time   SDES URINE, RANDOM 04/01/2023 0047   SPECREQUEST NONE Reflexed from [id number] 04/01/2023 0047   CULT  04/01/2023 0047    NO GROWTH Performed at [facility], [address] [place], [place] [address]  REPTSTATUS 04/02/2023 FINAL 04/01/2023 0047    Other culture-see note  Radiology Studies: No results found.   LOS: 4 days   Lanae Boast, MD [facility]  04/04/2023, 1:07 PM

## 2023-04-04 NOTE — Plan of Care (Signed)
  Problem: Activity: Goal: Ability to tolerate increased activity will improve Outcome: Progressing   Problem: Respiratory: Goal: Ability to maintain a clear airway and adequate ventilation will improve Outcome: Progressing   Problem: Role Relationship: Goal: Method of communication will improve Outcome: Progressing   Problem: Education: Goal: Ability to describe self-care measures that may prevent or decrease complications (Diabetes Survival Skills Education) will improve Outcome: Progressing Goal: Individualized Educational Video(s) Outcome: Progressing   Problem: Coping: Goal: Ability to adjust to condition or change in health will improve Outcome: Progressing   Problem: Fluid Volume: Goal: Ability to maintain a balanced intake and output will improve Outcome: Progressing   Problem: Health Behavior/Discharge Planning: Goal: Ability to identify and utilize available resources and services will improve Outcome: Progressing Goal: Ability to manage health-related needs will improve Outcome: Progressing   Problem: Metabolic: Goal: Ability to maintain appropriate glucose levels will improve Outcome: Progressing   Problem: Nutritional: Goal: Maintenance of adequate nutrition will improve Outcome: Progressing Goal: Progress toward achieving an optimal weight will improve Outcome: Progressing   Problem: Skin Integrity: Goal: Risk for impaired skin integrity will decrease Outcome: Progressing   Problem: Tissue Perfusion: Goal: Adequacy of tissue perfusion will improve Outcome: Progressing   Problem: Safety: Goal: Non-violent Restraint(s) Outcome: Progressing   Problem: Education: Goal: Knowledge of General Education information will improve Description: Including pain rating scale, medication(s)/side effects and non-pharmacologic comfort measures Outcome: Progressing   Problem: Health Behavior/Discharge Planning: Goal: Ability to manage health-related needs will  improve Outcome: Progressing   Problem: Clinical Measurements: Goal: Ability to maintain clinical measurements within normal limits will improve Outcome: Progressing Goal: Will remain free from infection Outcome: Progressing Goal: Diagnostic test results will improve Outcome: Progressing Goal: Respiratory complications will improve Outcome: Progressing Goal: Cardiovascular complication will be avoided Outcome: Progressing   Problem: Activity: Goal: Risk for activity intolerance will decrease Outcome: Progressing   Problem: Nutrition: Goal: Adequate nutrition will be maintained Outcome: Progressing   Problem: Coping: Goal: Level of anxiety will decrease Outcome: Progressing   Problem: Elimination: Goal: Will not experience complications related to bowel motility Outcome: Progressing Goal: Will not experience complications related to urinary retention Outcome: Progressing   Problem: Pain Managment: Goal: General experience of comfort will improve Outcome: Progressing   Problem: Safety: Goal: Ability to remain free from injury will improve Outcome: Progressing   Problem: Skin Integrity: Goal: Risk for impaired skin integrity will decrease Outcome: Progressing

## 2023-04-05 ENCOUNTER — Other Ambulatory Visit (HOSPITAL_COMMUNITY): Payer: Self-pay

## 2023-04-05 ENCOUNTER — Inpatient Hospital Stay (HOSPITAL_COMMUNITY): Payer: PRIVATE HEALTH INSURANCE

## 2023-04-05 DIAGNOSIS — R569 Unspecified convulsions: Secondary | ICD-10-CM | POA: Diagnosis not present

## 2023-04-05 LAB — GLUCOSE, CAPILLARY
Glucose-Capillary: 94 mg/dL (ref 70–99)
Glucose-Capillary: 86 mg/dL (ref 70–99)

## 2023-04-05 LAB — CK: Total CK: 5991 U/L — ABNORMAL HIGH (ref 38–234)

## 2023-04-05 MED ORDER — POTASSIUM CHLORIDE CRYS ER 20 MEQ PO TBCR
20.0000 meq | EXTENDED_RELEASE_TABLET | Freq: Once | ORAL | Status: AC
  Administered 2023-04-05: 20 meq via ORAL
  Filled 2023-04-05: qty 1

## 2023-04-05 MED ORDER — LEVETIRACETAM 750 MG PO TABS
750.0000 mg | ORAL_TABLET | Freq: Two times a day (BID) | ORAL | 0 refills | Status: DC
  Filled 2023-04-05: qty 60, 30d supply, fill #0

## 2023-04-05 NOTE — Discharge Summary (Signed)
Physician Discharge Summary  Sandra Miller:[id number] DOB: [birth year] DOA: 03/31/2023  PCP: Marcine Matar, MD  Admit date: 03/31/2023 Discharge date: 04/05/2023 Recommendations for Outpatient Follow-up:  Follow up with PCP in 1 weeks-call for appointment Follow-up with neurology in a week Please obtain BMP/CBC/CK in one week and hold statin until then  Discharge Dispo: home Discharge Condition: Stable Code Status:   Code Status: Full Code Diet recommendation:  Diet Order             Diet regular Room service appropriate? Yes with Assist; Fluid consistency: Thin  Diet effective now                    Brief/Interim Summary: 47 yof with history of tobacco use breast and vulvar cancers s/p chemo xrt and hysterectomy on letrozole, hypertension, IDA, Graves' disease, anxiety, bipolar, hiatal hernia TMJ was found minimally responsive on the floor by her daughter  on 03/31/23  after 915am.Daughter reports patient has had intermittent nausea and vomiting being treated with phenergan for months but worse over the last week with poor PO intake.No reported weight loss.  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  No previous history of seizures  Pt called her daughter asking her to come over,didn't feel well.  Daughter found her on the floor less than 30 mins later.  Witnessed seizure, then 2 additional seizures with EMS. In ED>had rightward gaze and remained minimally responsive with additional two seizures in ER.  Neurology was consulted emergently.  CTH showed 1.6 x 0.5 cm hyperdense extra-axial focus over left frontal lobe concerning for either SDH with fall vs mass, cervical CT neg for acute fracture.  Loaded with keppra and treated with ativan.  Labs significant for WBC 25.8, K 3.4, sCr 1, Mag  0.8, phos 5.4, calcium 10.6, AST 47, TSH 4.7, glucose 316, AG 33.  Patient was admitted to ICU 7/26 presented to the ED with concern for status, intubated  7/27: MRI negative for acute intracranial process.  Negative CSF pathogen assay.  CSF analysis not consistent with infection or inflammation Overnight EEG with video-assisted for mild to moderate diffuse encephalopathy nonspecific etiology no seizure or epileptiform discharges seen-LTM EEG discontinued 7/27 Patient transferred to [facility] 7/29> repeat lab showed worsening CK level patient was placed on IV fluid hydration-CK trended renal function remained stable overall mobilizing well mental status better PT OT suggesting outpatient PT OT at this time she is stable    Discharge Diagnoses:  Principal Problem:   Seizure (HCC) Active Problems:   Respiratory depression   Status epilepticus (HCC)    New onset seizure Status post fall Acute encephalopathy likely postictal: Etiology unclear-seizure may have been caused by syncopal episode from hypotension from dehydration, as patient was having frequent nausea vomiting and poor oral intake.  Labs also had hypokalemia.  Seen by neurology EEG no seizure, managed with Keppra> continue the same.  Patient completed neuro Imaging and CSF studies unremarkable.  Neurology signed off,. I discussed w/ neuro 7/29> since this is a provoked seizure advised to continue driving restriction until cleared by neurology on next outpatient follow-up and may not need 6 month restrictions-patient and patient's son aware  Hypomagnesemia/Hypokalemia: Resolved  Intermittent nausea vomiting and poor oral for months: being managed by PCP on Phenergan. Cont supportive care.Tolerating  diet. Abdominal exam is benign   Rhabdomyolysis due to fall: CK was uptrending -given IV fluids now CK nicely improving > since he is tolerating p.o. well advised aggressive oral hydration at home and follow-up with PCP and hold off on statin  until CK better   Recent Labs  Lab 03/31/23 1852 03/31/23 2148 04/01/23 0634 04/03/23 0647 04/03/23 2332 04/04/23 2335 04/05/23 0847  CKTOTAL 2,723* 3,472* 3,951* 9,175* 11,408* 8,050* 5,991*    Leukocytosis: resolved  High anion gap metabolic acidosis/lactic acidosis: In the setting of seizure.  Electrolytes are stabilized.  Acute respiratory insufficiency: needing intubation for airway protection and postictal state. On RA now.  Prior history of breast and vulvar cancer  S/p chemoradiation and surgery continue home letrozole  Hypertension: BP stable on lisinopril, amlodipine, Aldactone  Hyperthyroidism: Continue Tapazole  HLD: holding Crestor due to rising CK.  Anxiety disorder: continue Prozac, Neurontin, gabapentin.  Right hip OA- xray done. Fu with pcp, otherwise management  Class 1 Obesity:Patient's Body mass index is 32.25 kg/m.: Will benefit with PCP follow-up, weight loss  healthy lifestyle and outpatient sleep evaluation.  Consults: Neurology, critical care Subjective: Alert awake oriented resting comfortably some groin pain but not new -reports she is waiting for bone scan  Discharge Exam: Vitals:   04/05/23 0521 04/05/23 0743  BP: (!) 135/103 (!) 145/92  Pulse: (!) 101 98  Resp: 18 17  Temp: 98.9 F (37.2 C) 97.8 F (36.6 C)  SpO2: 96% 98%   General: Pt is alert, awake, not in acute distress Cardiovascular: RRR, S1/S2 +, no rubs, no gallops Respiratory: CTA bilaterally, no wheezing, no rhonchi Abdominal: Soft, NT, ND, bowel sounds + Extremities: no edema, no cyanosis  Discharge Instructions  Discharge Instructions     Ambulatory referral to Physical Therapy   Complete by: As directed    Iontophoresis - 4 mg/ml of dexamethasone: No   T.E.N.S. Unit Evaluation and Dispense as Indicated: No   Discharge instructions   Complete by: As directed    Holding statin until CK level checked by PCP in a week  Follow-up with neurology and  discuss about driving restriction removal  Please call call MD or return to ER for similar or worsening recurring problem that brought you to hospital or if any fever,nausea/vomiting,abdominal pain, uncontrolled pain, chest pain,  shortness of breath or any other alarming symptoms.  Please follow-up your doctor as instructed in a week time and call the office for appointment.  Please avoid alcohol, smoking, or any other illicit substance and maintain healthy habits including taking your regular medications as prescribed.  You were cared for by a hospitalist during your hospital stay. If you have any questions about your discharge medications or the care you received while you were in the hospital after you are discharged, you can call the unit and ask to speak with the hospitalist on call if the hospitalist that took care of you is not available.  Once you are discharged, your primary care physician will handle any further medical issues. Please note that NO REFILLS for any discharge medications will be authorized once you are discharged, as it is imperative that you return to your primary care physician (or establish a relationship with a primary care physician if you do not have one) for your aftercare needs so that they can reassess your need for medications and monitor your lab values   Increase activity slowly   Complete by: As directed       Allergies as of  04/05/2023       Reactions   Dilaudid [hydromorphone Hcl] Other (See Comments)   Pt became confused, pulled out iv's, does not remember anything   Aspirin Hives   States able to tolerate Goody Powders and Ibuprofen without any problem    Depakote [divalproex Sodium] Nausea And Vomiting   Minocycline Hives        Medication List     STOP taking these medications    methylPREDNISolone 4 MG Tbpk tablet Commonly known as: MEDROL DOSEPAK   rosuvastatin 10 MG tablet Commonly known as: CRESTOR       TAKE these medications     Ajovy 225 MG/1.5ML Soaj Generic drug: Fremanezumab-vfrm Inject 225 mg into the skin every 30 (thirty) days.   amLODipine 5 MG tablet Commonly known as: NORVASC Take 5 mg by mouth daily.   diclofenac Sodium 1 % Gel Commonly known as: Voltaren Apply 4 grams topically 4  times daily as needed. What changed: reasons to take this   FLUoxetine 10 MG capsule Commonly known as: PROZAC Take 3 capsules (30 mg total) by mouth daily. What changed: when to take this   gabapentin 300 MG capsule Commonly known as: NEURONTIN Take 1 capsule (300 mg total) by mouth daily. Pt needs office visit.   letrozole 2.5 MG tablet Commonly known as: FEMARA Take 1 tablet by mouth daily.   letrozole 2.5 MG tablet Commonly known as: FEMARA Take 1 tablet (2.5 mg total) by mouth daily.   levETIRAcetam 750 MG tablet Commonly known as: KEPPRA Take 1 tablet (750 mg total) by mouth 2 (two) times daily.   lisinopril 10 MG tablet Commonly known as: ZESTRIL Take 1 tablet (10 mg total) by mouth daily. Stop the Lisinopril/HCTZ combination   LORazepam 1 MG tablet Commonly known as: ATIVAN Take 1 tablet (1 mg total) by mouth 3 (three) times daily as needed for anxiety What changed: reasons to take this   methimazole 5 MG tablet Commonly known as: TAPAZOLE Take one tablet (5 mg dose) by mouth daily. What changed:  how much to take how to take this when to take this   multivitamin with minerals Tabs tablet Take 1 tablet by mouth daily.   OLANZapine 15 MG tablet Commonly known as: ZYPREXA Take 1 tablet (15 mg total) by mouth at bedtime.   omeprazole 40 MG capsule Commonly known as: PRILOSEC Take 1 capsule (40 mg total) by mouth in the morning and at bedtime.   ondansetron 8 MG disintegrating tablet Commonly known as: ZOFRAN-ODT Dissolve 1 tablet (8 mg total) by mouth every 8 (eight) hours as needed for nausea or vomiting.   potassium chloride SA 20 MEQ tablet Commonly known as: KLOR-CON  M Take 1 tablet (20 mEq) by mouth daily.   promethazine 25 MG tablet Commonly known as: PHENERGAN Take 1 tablet (25 mg total) by mouth every 8 (eight) hours as needed for nausea or vomiting.   propranolol 10 MG tablet Commonly known as: INDERAL Take 1 tablet by mouth 2 (two) times daily.   senna-docusate 8.6-50 MG tablet Commonly known as: Senokot-S Take 2 tablets by mouth at bedtime. For AFTER surgery only, do not take if having diarrhea What changed:  when to take this reasons to take this   spironolactone 25 MG tablet Commonly known as: Aldactone Take 0.5 tablets (12.5 mg total) by mouth daily.   Ubrelvy 100 MG Tabs Generic drug: Ubrogepant Take 1 tablet (100 mg total) by mouth every 2 (two) hours as needed.  Maximum 200mg  a day. What changed: reasons to take this               Durable Medical Equipment  (From admission, onward)           Start     Ordered   04/03/23 1402  For home use only DME Walker rolling  Once       Question Answer Comment  Walker: With 5 Inch Wheels   Patient needs a walker to treat with the following condition Generalized weakness      04/03/23 1401   04/03/23 1402  For home use only DME 3 n 1  Once        04/03/23 1401            Follow-up Information     [facility] Follow up.   Specialty: Rehabilitation Why: Please call the Outpatient Rehabilitation center to follow up regarding OUtpatient therapies. Contact information: [address] [place] [address] [phone]        Marcine Matar, MD. Schedule an appointment as soon as possible for a visit.   Specialty: Internal Medicine Why: Call the office and schedule a hospital follow up in the next 7-10 days. Contact information: [address] [place] [address] [phone]         [facility], [facility] Follow up.   Why: [employer] will be providing a RW and  3:1. Contact information: [address] [place] [address] [phone]                Allergies  Allergen Reactions   Dilaudid [Hydromorphone Hcl] Other (See Comments)    Pt became confused, pulled out iv's, does not remember anything   Aspirin Hives    States able to tolerate Goody Powders and Ibuprofen without any problem    Depakote [Divalproex Sodium] Nausea And Vomiting   Minocycline Hives    The results of significant diagnostics from this hospitalization (including imaging, microbiology, ancillary and laboratory) are listed below for reference.    Microbiology: Recent Results (from the past 240 hour(s))  MRSA Next Gen by PCR, Nasal     Status: None   Collection Time: 03/31/23  5:39 PM   Specimen: Nasal Mucosa; Nasal Swab  Result Value Ref Range Status   MRSA by PCR Next Gen NOT DETECTED NOT DETECTED Final    Comment: (NOTE) The GeneXpert MRSA Assay (FDA approved for NASAL specimens only), is one component of a comprehensive MRSA colonization surveillance program. It is not intended to diagnose MRSA infection nor to guide or monitor treatment for MRSA infections. Test performance is not FDA approved in patients less than 43 years old. Performed at [facility], [address] [place], [place] [address]   CSF culture w Gram Stain     Status: None   Collection Time: 03/31/23  6:08 PM   Specimen: CSF; Cerebrospinal Fluid  Result Value Ref Range Status   Specimen Description CSF  Final   Special Requests LP  Final   Gram Stain NO WBC SEEN NO ORGANISMS SEEN   Final   Culture   Final    NO GROWTH 3 DAYS Performed at [facility], [address], [place], [place] [address]    Report Status 04/03/2023 FINAL  Final  Culture, blood (Routine X 2) w Reflex to ID Panel     Status: None   Collection Time: 03/31/23  6:52  PM   Specimen: BLOOD RIGHT ARM  Result Value Ref Range Status   Specimen Description BLOOD RIGHT ARM  Final   Special Requests    Final    BOTTLES DRAWN AEROBIC ONLY Blood Culture results may not be optimal due to an inadequate volume of blood received in culture bottles   Culture   Final    NO GROWTH 5 DAYS Performed at [facility], [address], [place], [place] [address]    Report Status 04/05/2023 FINAL  Final  Culture, blood (Routine X 2) w Reflex to ID Panel     Status: None   Collection Time: 03/31/23  7:02 PM   Specimen: BLOOD RIGHT HAND  Result Value Ref Range Status   Specimen Description BLOOD RIGHT HAND  Final   Special Requests   Final    BOTTLES DRAWN AEROBIC ONLY Blood Culture adequate volume   Culture   Final    NO GROWTH 5 DAYS Performed at [facility], [address], [place], [place] [address]    Report Status 04/05/2023 FINAL  Final  Urine Culture     Status: None   Collection Time: 04/01/23 12:47 AM   Specimen: Urine, Random  Result Value Ref Range Status   Specimen Description URINE, RANDOM  Final   Special Requests NONE Reflexed from [id number]  Final   Culture   Final    NO GROWTH Performed at [facility], [address], [place], [place] [address]    Report Status 04/02/2023 FINAL  Final    Procedures/Studies: DG HIP UNILAT WITH PELVIS 2-3 VIEWS RIGHT  Result Date: 04/05/2023 CLINICAL DATA:  Chronic right hip pain.  No known injury. EXAM: DG HIP (WITH OR WITHOUT PELVIS) 2-3V RIGHT COMPARISON:  CT abdomen and pelvis 08/06/2021 FINDINGS: The bilateral femoroacetabular joint spaces appear maintained. There is again mild-to-moderate right and mild left femoral head-neck junction circumferential degenerative osteophytosis. Mild bilateral superolateral acetabular degenerative osteophytes. The bilateral sacroiliac and pubic symphysis joint spaces are maintained. No acute fracture or dislocation. Vascular phleboliths overlie the pelvis. IMPRESSION: Mild-to-moderate right and mild left femoroacetabular osteoarthritis. Electronically Signed   By: Neita Garnet M.D.   On:  04/05/2023 12:48   CT VENOGRAM HEAD  Result Date: 04/01/2023 CLINICAL DATA:  Provided history: Dural venous sinus thrombosis suspected. EXAM: CT VENOGRAM HEAD TECHNIQUE: Venographic phase images of the brain were obtained following the administration of intravenous contrast. Multiplanar reformats and maximum intensity projections were generated. RADIATION DOSE REDUCTION: This exam was performed according to the departmental dose-optimization program which includes automated exposure control, adjustment of the mA and/or kV according to patient size and/or use of iterative reconstruction technique. CONTRAST:  75mL OMNIPAQUE IOHEXOL 350 MG/ML SOLN COMPARISON:  Head CT 03/31/2023.  Brain MRI 03/31/2023. FINDINGS: The superior sagittal sinus, internal cerebral veins, vein of Galen, straight sinus, transverse sinuses, sigmoid sinuses and visualized jugular veins are patent. No evidence of dural venous sinus thrombosis. No pathologic parenchymal enhancement is identified. IMPRESSION: No evidence of dural venous sinus thrombosis. Electronically Signed   By: Jackey Loge D.O.   On: 04/01/2023 14:33   CT ANGIO HEAD W OR WO CONTRAST  Result Date: 04/01/2023 CLINICAL DATA:  AVM/AVF, high flow vascular malformation. Compared to previous CT. EXAM: CT ANGIOGRAPHY HEAD TECHNIQUE: Multidetector CT imaging of the head was performed using the standard protocol during bolus administration of intravenous contrast. Multiplanar CT image reconstructions and MIPs were obtained to evaluate the vascular anatomy. RADIATION DOSE REDUCTION:  This exam was performed according to the departmental dose-optimization program which includes automated exposure control, adjustment of the mA and/or kV according to patient size and/or use of iterative reconstruction technique. CONTRAST:  75mL OMNIPAQUE IOHEXOL 350 MG/ML SOLN COMPARISON:  Brain MRI 03/31/2023. Head CT 03/31/2023. CT angiogram head 06/25/2022. FINDINGS: CT HEAD Brain: No age advanced  or lobar predominant parenchymal atrophy. 1.6 x 0.5 cm extra-axial focus of hyperdensity overlying the high left frontal lobe, unchanged in size from the prior head CT of 03/31/2023. This is favored to reflect a small acute extra-axial hemorrhage given the absence of an enhancing mass at this site on the prior brain MRI of 03/31/2023. Contact upon the underlying brain parenchyma, as before. Partially empty sella turcica. No new intracranial hemorrhage is identified elsewhere. No demarcated cortical infarct. No evidence of an intracranial mass. No midline shift. Vascular: No hyperdense vessel.  Atherosclerotic calcifications. Skull: No calvarial fracture or aggressive osseous lesion. Orbits/Sinuses: No mass or acute finding within the imaged orbits. Redemonstrated chronic medially displaced fracture deformity of the left lamina papyracea. Mild mucosal thickening scattered within bilateral ethmoid air cells. Partially imaged mucous retention cyst within the left maxillary sinus. Other: Small volume fluid within the right mastoid air cells. CTA HEAD Please note, due to malfunction of the CT scanner, the cephalad portion of the head (at the level of the high frontoparietal lobes) is excluded from the field of view. Within this limitation, findings are as follows. Anterior circulation: The intracranial internal carotid arteries are patent. Minimal nonstenotic atherosclerotic plaque within the intracranial left ICA. The M1 middle cerebral arteries are patent. No M2 proximal branch occlusion or high-grade proximal stenosis. The anterior cerebral arteries are patent. No intracranial aneurysm is identified. No evidence of AVM at the imaged levels. Posterior circulation: The intracranial vertebral arteries are patent. The basilar artery is patent. The posterior cerebral arteries are patent. A left posterior communicating artery is present. The right posterior communicating artery is diminutive or absent. Venous sinuses: No  evidence of a dural venous sinus thrombosis at the imaged levels. Anatomic variants: As described. Review of the MIP images confirms the above findings. IMPRESSION: Non-contrast head CT: 1. 1.6 x 0.5 cm extra-axial hyperdense focus overlying the high left frontal lobe, stable in size as compared to the head CT of 03/31/2023. This likely reflects a small acute extra-axial hemorrhage (given the lack of an enhancing mass at this site on the brain MRI of 03/31/2023). Contact upon the underlying brain parenchyma, as before. 2. No evidence of an interval acute intracranial abnormality. 3. Partially empty sella turcica. This finding can reflect incidental anatomic variation, or alternatively, it can be associated with idiopathic intracranial hypertension (pseudotumor cerebri). CTA head: 1. Please note due to malfunction of the CT scanner, the cephalad portion of the head (at the level of the high frontoparietal lobes) is excluded from the field of view. Within this limitation, findings are as follows. 2. No intracranial large vessel occlusion or proximal high-grade arterial stenosis. 3. No evidence of an AVM at the imaged levels. Electronically Signed   By: Jackey Loge D.O.   On: 04/01/2023 14:11   Overnight EEG with video  Result Date: 04/01/2023 Charlsie Quest, MD     04/01/2023  2:37 PM Patient Name: Sandra Miller MRN: [id number] Epilepsy Attending: Charlsie Quest Referring Physician/Provider: Cheri Fowler, MD Duration: 03/31/2023 2048 to 04/01/2023 1407  Patient history: 47 yo F patient called daughter and told her she was not feeling well c/o dizziness.  Daughter went to check on patient and found her on the floor unresponsive, marking noted to side of head patient may have struck head on dresser. Per EMS patient had a total of 3 seizures, and received a total of 5 mg IV Versed. On arrival patient posturing and eyes deviated to right. EEG to evaluate for seizure.  Level of alertness: lethargic  AEDs during EEG  study: LEV, Ativan  Technical aspects: This EEG study was done with scalp electrodes positioned according to the 10-20 International system of electrode placement. Electrical activity was reviewed with band pass filter of 1-70Hz , sensitivity of 7 uV/mm, display speed of 32mm/sec with a 60Hz  notched filter applied as appropriate. EEG data were recorded continuously and digitally stored.  Video monitoring was available and reviewed as appropriate.  Description: The posterior dominant rhythm consists of 8-9 Hz activity of moderate voltage (25-35 uV) seen predominantly in posterior head regions, symmetric and reactive to eye opening and eye closing. Sleep was characterized by vertex waves, sleep spindles (12 to 14 Hz), maximal frontocentral region. EEG showed intermittent generalized 3-6hz  theta-delta slowing. Hyperventilation and photic stimulation were not performed.    ABNORMALITY - Intermittent slow, generalized  IMPRESSION: This study is suggestive of mild to moderate diffuse encephalopathy, nonspecific etiology. No seizures or epileptiform discharges were seen throughout the recording.   Charlsie Quest   DG Abd Portable 1V  Result Date: 03/31/2023 CLINICAL DATA:  Orogastric tube placement. EXAM: PORTABLE ABDOMEN - 1 VIEW COMPARISON:  None Available. FINDINGS: Tip and side port of the enteric tube below the diaphragm in the stomach. Nonobstructive upper bowel gas pattern. IMPRESSION: Tip and side port of the enteric tube below the diaphragm in the stomach. Electronically Signed   By: Narda Rutherford M.D.   On: 03/31/2023 18:38   MR BRAIN W WO CONTRAST  Addendum Date: 03/31/2023   ADDENDUM REPORT: 03/31/2023 17:49 ADDENDUM: On further review, there is subtle susceptibility artifact in the region of possible extra-axial hemorrhage seen on same day CT head. This area was better characterized on same day CT head. No significant mass effect or adjacent brain edema. Findings discussed with Dr. Wilford Corner via  telephone at 5:49 p.m. Electronically Signed   By: Feliberto Harts M.D.   On: 03/31/2023 17:49   Result Date: 03/31/2023 CLINICAL DATA:  Neuro deficit, acute, stroke suspected EXAM: MRI HEAD WITHOUT AND WITH CONTRAST TECHNIQUE: Multiplanar, multiecho pulse sequences of the brain and surrounding structures were obtained without and with intravenous contrast. CONTRAST:  8.39mL GADAVIST GADOBUTROL 1 MMOL/ML IV SOLN COMPARISON:  CT head 03/31/2023. FINDINGS: Brain: No acute infarction, hemorrhage, hydrocephalus, extra-axial collection or mass lesion. Vascular: Major arterial flow voids are maintained. Skull and upper cervical spine: Normal marrow signal. Sinuses/Orbits: Mild paranasal sinus mucosal thickening. Left maxillary sinus retention cyst. No acute orbital finding. Other: Small right mastoid effusion. IMPRESSION: No evidence of acute intracranial abnormality. Electronically Signed: By: Feliberto Harts M.D. On: 03/31/2023 17:26   DG Chest Portable 1 View  Result Date: 03/31/2023 CLINICAL DATA:  AMS, Seizures EXAM: PORTABLE CHEST 1 VIEW COMPARISON:  04/11/2020. FINDINGS: Bilateral lung fields are clear. Bilateral costophrenic angles are clear. Normal cardio-mediastinal silhouette. No acute osseous abnormalities. The soft tissues are within normal limits. IMPRESSION: No active disease. Electronically Signed   By: Jules Schick M.D.   On: 03/31/2023 13:04   CT Head Wo Contrast  Result Date: 03/31/2023 CLINICAL DATA:  Provided history: Poly trauma, blunt. Seizure, new onset, no history of trauma. Additional history provided  by ER physician: Cancer history. Patient found down with bruising to left cheek. EXAM: CT HEAD WITHOUT CONTRAST CT CERVICAL SPINE WITHOUT CONTRAST TECHNIQUE: Multidetector CT imaging of the head and cervical spine was performed following the standard protocol without intravenous contrast. Multiplanar CT image reconstructions of the cervical spine were also generated. RADIATION DOSE  REDUCTION: This exam was performed according to the departmental dose-optimization program which includes automated exposure control, adjustment of the mA and/or kV according to patient size and/or use of iterative reconstruction technique. COMPARISON:  CT angiogram head 06/25/2022. CT venogram head 06/25/2022. Cervical spine MRI 11/05/2014. FINDINGS: CT HEAD FINDINGS Brain: No age advanced or lobar predominant parenchymal atrophy. Extra-axial hyperdense focus overlying the high left frontal lobe, measuring 1.6 x 0.5 cm (best appreciated on series 5, image 46). There is contact upon the underlying brain parenchyma. No demarcated cortical infarct. No extra-axial fluid collection. No midline shift. Vascular: No hyperdense vessel.  Atherosclerotic calcifications. Skull: No calvarial fracture or aggressive osseous lesion. Sinuses/Orbits: No mass or acute finding within the imaged orbits. 2.1 cm mucous retention cyst within the left maxillary sinus. Mild mucosal thickening scattered within bilateral ethmoid air cells. Other: Small-volume fluid within the right mastoid air cells. Anterior displacement of the mandibular condyles bilaterally. CT CERVICAL SPINE FINDINGS Alignment: Dextrocurvature of the cervical spine. Partially imaged levocurvature of the upper thoracic spine. No significant spondylolisthesis. Skull base and vertebrae: The basion-dental and atlanto-dental intervals are maintained.No evidence of acute fracture to the cervical spine. Soft tissues and spinal canal: No prevertebral fluid or swelling. No visible canal hematoma. Disc levels: Cervical spondylosis with multilevel disc space narrowing, disc bulges/central disc protrusions, posterior disc osteophyte complexes, endplate spurring and uncovertebral hypertrophy. Multilevel spinal canal stenosis, most notably as follows. At C3-C4, a central disc protrusion contributes to apparent moderate spinal canal stenosis. At C4-C5 and C5-C6, posterior disc  osteophyte complexes contribute to at least moderate spinal canal stenosis. Multilevel neural foraminal narrowing. Upper chest: No consolidation within the imaged lung apices. No visible pneumothorax. Centrilobular and paraseptal emphysema. Impression #1 called by telephone at the time of interpretation on 03/31/2023 at 10:35 Am to provider [facility] , who verbally acknowledged these results. IMPRESSION: CT head: 1. 1.6 x 0.5 cm hyperdense extra-axial focus overlying the high left frontal lobe. Given recent trauma, this may reflect an acute extra-axial hemorrhage. However, a dural-based mass cannot be excluded. This should be followed up with non-contrast head CTs to resolution, or further evaluated with a brain MRI (with and without contrast). 2. Anterior displacement of the mandibular condyles bilaterally. Correlate with physical exam findings to exclude TMJ dislocation. 3. Paranasal sinus disease as described. 4. Small-volume fluid within the right mastoid air cells. CT  cervical spine: 1. No evidence of an acute cervical spine fracture. 2. Mild Dextrocurvature of the cervical spine, possibly positional. 3. Cervical spondylosis as described. 4. Emphysema (ICD10-J43.9). Electronically Signed   By: Jackey Loge D.O.   On: 03/31/2023 12:03   CT Cervical Spine Wo Contrast  Result Date: 03/31/2023 CLINICAL DATA:  Provided history: Poly trauma, blunt. Seizure, new onset, no history of trauma. Additional history provided by ER physician: Cancer history. Patient found down with bruising to left cheek. EXAM: CT HEAD WITHOUT CONTRAST CT CERVICAL SPINE WITHOUT CONTRAST TECHNIQUE: Multidetector CT imaging of the head and cervical spine was performed following the standard protocol without intravenous contrast. Multiplanar CT image reconstructions of the cervical spine were also generated. RADIATION DOSE REDUCTION: This exam was performed according to the departmental dose-optimization  program which includes automated  exposure control, adjustment of the mA and/or kV according to patient size and/or use of iterative reconstruction technique. COMPARISON:  CT angiogram head 06/25/2022. CT venogram head 06/25/2022. Cervical spine MRI 11/05/2014. FINDINGS: CT HEAD FINDINGS Brain: No age advanced or lobar predominant parenchymal atrophy. Extra-axial hyperdense focus overlying the high left frontal lobe, measuring 1.6 x 0.5 cm (best appreciated on series 5, image 46). There is contact upon the underlying brain parenchyma. No demarcated cortical infarct. No extra-axial fluid collection. No midline shift. Vascular: No hyperdense vessel.  Atherosclerotic calcifications. Skull: No calvarial fracture or aggressive osseous lesion. Sinuses/Orbits: No mass or acute finding within the imaged orbits. 2.1 cm mucous retention cyst within the left maxillary sinus. Mild mucosal thickening scattered within bilateral ethmoid air cells. Other: Small-volume fluid within the right mastoid air cells. Anterior displacement of the mandibular condyles bilaterally. CT CERVICAL SPINE FINDINGS Alignment: Dextrocurvature of the cervical spine. Partially imaged levocurvature of the upper thoracic spine. No significant spondylolisthesis. Skull base and vertebrae: The basion-dental and atlanto-dental intervals are maintained.No evidence of acute fracture to the cervical spine. Soft tissues and spinal canal: No prevertebral fluid or swelling. No visible canal hematoma. Disc levels: Cervical spondylosis with multilevel disc space narrowing, disc bulges/central disc protrusions, posterior disc osteophyte complexes, endplate spurring and uncovertebral hypertrophy. Multilevel spinal canal stenosis, most notably as follows. At C3-C4, a central disc protrusion contributes to apparent moderate spinal canal stenosis. At C4-C5 and C5-C6, posterior disc osteophyte complexes contribute to at least moderate spinal canal stenosis. Multilevel neural foraminal narrowing. Upper  chest: No consolidation within the imaged lung apices. No visible pneumothorax. Centrilobular and paraseptal emphysema. Impression #1 called by telephone at the time of interpretation on 03/31/2023 at 10:35 Am to provider [facility] , who verbally acknowledged these results. IMPRESSION: CT head: 1. 1.6 x 0.5 cm hyperdense extra-axial focus overlying the high left frontal lobe. Given recent trauma, this may reflect an acute extra-axial hemorrhage. However, a dural-based mass cannot be excluded. This should be followed up with non-contrast head CTs to resolution, or further evaluated with a brain MRI (with and without contrast). 2. Anterior displacement of the mandibular condyles bilaterally. Correlate with physical exam findings to exclude TMJ dislocation. 3. Paranasal sinus disease as described. 4. Small-volume fluid within the right mastoid air cells. CT  cervical spine: 1. No evidence of an acute cervical spine fracture. 2. Mild Dextrocurvature of the cervical spine, possibly positional. 3. Cervical spondylosis as described. 4. Emphysema (ICD10-J43.9). Electronically Signed   By: Jackey Loge D.O.   On: 03/31/2023 12:03   EEG adult  Result Date: 03/31/2023 Charlsie Quest, MD     03/31/2023  4:06 PM Patient Name: Sandra Miller MRN: [id number] Epilepsy Attending: Charlsie Quest Referring Physician/Provider: Milon Dikes, MD Date: 03/31/2023 Duration: 30.19 mins Patient history: 47 yo F patient called daughter and told her she was not feeling well c/o dizziness. Daughter went to check on patient and found her on the floor unresponsive, marking noted to side of head patient may have struck head on dresser. Per EMS patient had a total of 3 seizures, and received a total of 5 mg IV Versed. On arrival patient posturing and eyes deviated to right. EEG to evaluate for seizure. Level of alertness: lethargic AEDs during EEG study: LEV, Ativan Technical aspects: This EEG study was done with scalp electrodes  positioned according to the 10-20 International system of electrode placement. Electrical activity was reviewed with band pass filter of 1-70Hz ,  sensitivity of 7 uV/mm, display speed of 54mm/sec with a 60Hz  notched filter applied as appropriate. EEG data were recorded continuously and digitally stored.  Video monitoring was available and reviewed as appropriate. Description: No clear posterior dominant rhythm was seen. EEG showed continuous generalized mixed frequencies with predominantly 9-10hz  alpha activity admixed with intermittent generalized 3-6hz  theta-delta slowing as well as 13-15hz  fronto-central beta activity. The amplitude of waves was lower in left temporo-parietal region which could be artifactual due to electrode impedence or underlying structural abnormality. Hyperventilation and photic stimulation were not performed.   Of note, study was technically difficult due to significant electrode and movement artifact. ABNORMALITY - Continuous slow, generalized IMPRESSION: This technically difficult study is suggestive of moderate diffuse encephalopathy, nonspecific etiology. No seizures or epileptiform discharges were seen throughout the recording. The amplitude of waves was lower in left temporo-parietal region which could be suggestive cortical dysfunction due to underlying structural abnormality. However, difficult to rule out artifact due to the quality of study. Consider repeat EEG if concern for ictal-interictal activity persists. Priyanka O Yadav    Labs: BNP (last 3 results) No results for input(s): "BNP" in the last 8760 hours. Basic Metabolic Panel: Recent Labs  Lab 03/31/23 1112 03/31/23 1704 03/31/23 2148 04/01/23 0630 04/01/23 1610 04/02/23 0358 04/03/23 0647 04/03/23 2332 04/04/23 2335  NA 143   < > 142   < > 141 139 140 141 141  K 3.4*   < > 2.9*   < > 2.9* 2.8* 3.2* 3.5 3.3*  CL 100  --  103  --  107 102 107 105 107  CO2 10*  --  22  --  21* 23 23 23  20*  GLUCOSE 316*   --  111*  --  116* 105* 108* 104* 100*  BUN 10  --  12  --  12 5* <5* 5* <5*  CREATININE 1.00  --  0.82  --  0.69 0.58 0.57 0.59 0.57  CALCIUM 10.6*  --  8.8*  --  8.2* 9.1 8.6* 8.6* 9.2  MG 0.8*  --  2.1  --  1.7 1.8  --   --   --   PHOS 5.4*  --   --   --  2.3* 1.9*  --   --   --    < > = values in this interval not displayed.   Liver Function Tests: Recent Labs  Lab 03/31/23 1112 03/31/23 2147 03/31/23 2148  AST 47* 86* 85*  ALT 32 30 34  ALKPHOS 56 42 40  BILITOT 0.8 0.6 1.1  PROT 8.3* 6.7 6.5  ALBUMIN 4.7 3.4* 3.5   No results for input(s): "LIPASE", "AMYLASE" in the last 168 hours. No results for input(s): "AMMONIA" in the last 168 hours. CBC: Recent Labs  Lab 03/31/23 1103 03/31/23 1704 04/01/23 0630 04/01/23 0743 04/02/23 0358 04/03/23 0647  WBC 25.8*  --   --  17.6* 16.5* 12.1*  NEUTROABS 21.5*  --   --   --   --   --   HGB 15.1* 14.6 11.6* 11.1* 13.2 11.4*  HCT 49.0* 43.0 34.0* 32.5* 40.0 35.4*  MCV 97.2  --   --  89.0 88.3 89.8  PLT 455*  --   --  249 261 230   Cardiac Enzymes: Recent Labs  Lab 03/31/23 2148 04/01/23 0634 04/03/23 0647 04/03/23 2332 04/04/23 2335 04/05/23 0847  CKTOTAL 3,472* 3,951* 9,175* 11,408* 8,050* 5,991*  CKMB 59.2*  --   --   --   --   --  Recent Labs  Lab 04/04/23 1229 04/04/23 1720 04/04/23 1916 04/05/23 0742 04/05/23 1218  GLUCAP 153* 100* 90 94 86      Component Value Date/Time   COLORURINE YELLOW 04/01/2023 0047   APPEARANCEUR HAZY (A) 04/01/2023 0047   APPEARANCEUR Clear 07/09/2018 1009   LABSPEC 1.027 04/01/2023 0047   PHURINE 5.0 04/01/2023 0047   GLUCOSEU NEGATIVE 04/01/2023 0047   HGBUR LARGE (A) 04/01/2023 0047   BILIRUBINUR NEGATIVE 04/01/2023 0047   BILIRUBINUR Negative 07/09/2018 1009   KETONESUR NEGATIVE 04/01/2023 0047   PROTEINUR >=300 (A) 04/01/2023 0047   UROBILINOGEN 0.2 12/27/2017 1856   NITRITE NEGATIVE 04/01/2023 0047   LEUKOCYTESUR NEGATIVE 04/01/2023 0047   Sepsis Labs Recent  Labs  Lab 03/31/23 1103 04/01/23 0743 04/02/23 0358 04/03/23 0647  WBC 25.8* 17.6* 16.5* 12.1*   Microbiology Recent Results (from the past 240 hour(s))  MRSA Next Gen by PCR, Nasal     Status: None   Collection Time: 03/31/23  5:39 PM   Specimen: Nasal Mucosa; Nasal Swab  Result Value Ref Range Status   MRSA by PCR Next Gen NOT DETECTED NOT DETECTED Final    Comment: (NOTE) The GeneXpert MRSA Assay (FDA approved for NASAL specimens only), is one component of a comprehensive MRSA colonization surveillance program. It is not intended to diagnose MRSA infection nor to guide or monitor treatment for MRSA infections. Test performance is not FDA approved in patients less than 38 years old. Performed at [facility], [address] [place], [place] [address]   CSF culture w Gram Stain     Status: None   Collection Time: 03/31/23  6:08 PM   Specimen: CSF; Cerebrospinal Fluid  Result Value Ref Range Status   Specimen Description CSF  Final   Special Requests LP  Final   Gram Stain NO WBC SEEN NO ORGANISMS SEEN   Final   Culture   Final    NO GROWTH 3 DAYS Performed at [facility], [address], [place], [place] [address]    Report Status 04/03/2023 FINAL  Final  Culture, blood (Routine X 2) w Reflex to ID Panel     Status: None   Collection Time: 03/31/23  6:52 PM   Specimen: BLOOD RIGHT ARM  Result Value Ref Range Status   Specimen Description BLOOD RIGHT ARM  Final   Special Requests   Final    BOTTLES DRAWN AEROBIC ONLY Blood Culture results may not be optimal due to an inadequate volume of blood received in culture bottles   Culture   Final    NO GROWTH 5 DAYS Performed at [facility], [address], [place], [place] [address]    Report Status 04/05/2023 FINAL  Final  Culture, blood (Routine X 2) w Reflex to ID Panel     Status: None   Collection Time: 03/31/23  7:02 PM   Specimen: BLOOD RIGHT HAND  Result Value Ref Range Status   Specimen  Description BLOOD RIGHT HAND  Final   Special Requests   Final    BOTTLES DRAWN AEROBIC ONLY Blood Culture adequate volume   Culture   Final    NO GROWTH 5 DAYS Performed at [facility], [address], [place], [place] [address]    Report Status 04/05/2023 FINAL  Final  Urine Culture     Status: None   Collection Time: 04/01/23 12:47 AM   Specimen: Urine, Random  Result Value Ref Range Status  Specimen Description URINE, RANDOM  Final   Special Requests NONE Reflexed from [id number]  Final   Culture   Final    NO GROWTH Performed at [facility], [address], [place], [place] [address]    Report Status 04/02/2023 FINAL  Final     Time coordinating discharge: 25 minutes  SIGNED: Lanae Boast, MD  [facility] 04/05/2023, 12:58 PM  If 7PM-7AM, please contact night-coverage [email or web address]

## 2023-04-05 NOTE — Plan of Care (Signed)
  Problem: Activity: Goal: Ability to tolerate increased activity will improve Outcome: Progressing   Problem: Respiratory: Goal: Ability to maintain a clear airway and adequate ventilation will improve Outcome: Progressing   Problem: Role Relationship: Goal: Method of communication will improve Outcome: Progressing   Problem: Education: Goal: Ability to describe self-care measures that may prevent or decrease complications (Diabetes Survival Skills Education) will improve Outcome: Progressing Goal: Individualized Educational Video(s) Outcome: Progressing   Problem: Coping: Goal: Ability to adjust to condition or change in health will improve Outcome: Progressing   Problem: Fluid Volume: Goal: Ability to maintain a balanced intake and output will improve Outcome: Progressing   Problem: Health Behavior/Discharge Planning: Goal: Ability to identify and utilize available resources and services will improve Outcome: Progressing Goal: Ability to manage health-related needs will improve Outcome: Progressing   Problem: Metabolic: Goal: Ability to maintain appropriate glucose levels will improve Outcome: Progressing   Problem: Nutritional: Goal: Maintenance of adequate nutrition will improve Outcome: Progressing Goal: Progress toward achieving an optimal weight will improve Outcome: Progressing   Problem: Skin Integrity: Goal: Risk for impaired skin integrity will decrease Outcome: Progressing   Problem: Tissue Perfusion: Goal: Adequacy of tissue perfusion will improve Outcome: Progressing   Problem: Safety: Goal: Non-violent Restraint(s) Outcome: Progressing   Problem: Education: Goal: Knowledge of General Education information will improve Description: Including pain rating scale, medication(s)/side effects and non-pharmacologic comfort measures Outcome: Progressing   Problem: Health Behavior/Discharge Planning: Goal: Ability to manage health-related needs will  improve Outcome: Progressing   Problem: Clinical Measurements: Goal: Ability to maintain clinical measurements within normal limits will improve Outcome: Progressing Goal: Will remain free from infection Outcome: Progressing Goal: Diagnostic test results will improve Outcome: Progressing Goal: Respiratory complications will improve Outcome: Progressing Goal: Cardiovascular complication will be avoided Outcome: Progressing   Problem: Activity: Goal: Risk for activity intolerance will decrease Outcome: Progressing   Problem: Nutrition: Goal: Adequate nutrition will be maintained Outcome: Progressing   Problem: Coping: Goal: Level of anxiety will decrease Outcome: Progressing   Problem: Elimination: Goal: Will not experience complications related to bowel motility Outcome: Progressing Goal: Will not experience complications related to urinary retention Outcome: Progressing   Problem: Pain Managment: Goal: General experience of comfort will improve Outcome: Progressing   Problem: Safety: Goal: Ability to remain free from injury will improve Outcome: Progressing   Problem: Skin Integrity: Goal: Risk for impaired skin integrity will decrease Outcome: Progressing

## 2023-04-05 NOTE — Progress Notes (Signed)
Physical Therapy Treatment Patient Details Name: Sandra Miller MRN: [id number] DOB: February 12, 1976 Today's Date: 04/05/2023   History of Present Illness 47 y.o. F adm 03/31/2023 with seizures, found down. CTH showed 1.6 x 0.5 cm hyperdense extra-axial focus over left frontal lobe concerning for either SDH with fall vs mass, cervical CT and head MRI (-). Intubated 7/26-7/27. PMH: breast and vulvar CA, HTN, IDA, Graves disease, anxiety/bipolar.    PT Comments  Pt greeted resting in bed and agreeable to session with continued progress towards acute goals. Pt requiring grossly min guard assist for transfers and gait with and without RW support. Without RW support pt with guarded gait, unable to scan environment without LOB and reaching out for external support, with RW support pt able to perform head turns up/down and R/L without LOB and with noticeably improved stability. Pt was educated on continued walker use to maximize functional independence, safety, and decrease risk for falls.  Pt continues to benefit from skilled PT services to progress toward functional mobility goals.     If plan is discharge home, recommend the following: Assistance with cooking/housework;Assist for transportation;Help with stairs or ramp for entrance   Can travel by private vehicle        Equipment Recommendations  Rolling walker (2 wheels);BSC/3in1    Recommendations for Other Services       Precautions / Restrictions Precautions Precautions: Fall Restrictions Weight Bearing Restrictions: No     Mobility  Bed Mobility Overal bed mobility: Needs Assistance Bed Mobility: Supine to Sit     Supine to sit: Supervision     General bed mobility comments: supervision for IV, increased time    Transfers Overall transfer level: Needs assistance Equipment used: None Transfers: Sit to/from Stand Sit to Stand: Supervision           General transfer comment: supervision for lines and safety     Ambulation/Gait Ambulation/Gait assistance: Supervision, Min guard Gait Distance (Feet): 165 Feet (x2) Assistive device: Rolling walker (2 wheels), None Gait Pattern/deviations: Step-through pattern, Decreased stride length, Trunk flexed Gait velocity: decr     General Gait Details: initial distance without UE support on AD, pt with good psoture however very guarded and with L drift, slight LOB noted with head turns, second bout with RW support with pt able to complete head turns R/L and up/down without LOB and with pt endorsing increased stability and confidence   Stairs             Wheelchair Mobility     Tilt Bed    Modified Rankin (Stroke Patients Only)       Balance Overall balance assessment: Mild deficits observed, not formally tested                                          Cognition Arousal/Alertness: Awake/alert Behavior During Therapy: Flat affect Overall Cognitive Status: Impaired/Different from baseline Area of Impairment: Memory, Safety/judgement, Problem solving                     Memory: Decreased short-term memory   Safety/Judgement: Decreased awareness of deficits, Decreased awareness of safety   Problem Solving: Requires verbal cues          Exercises      General Comments        Pertinent Vitals/Pain Pain Assessment Pain Assessment: Faces Faces Pain Scale: Hurts a little  bit Pain Location: rt groin Pain Descriptors / Indicators: Aching Pain Intervention(s): Monitored during session, Limited activity within patient's tolerance    Home Living                          Prior Function            PT Goals (current goals can now be found in the care plan section) Acute Rehab PT Goals Patient Stated Goal: did not state PT Goal Formulation: With patient Time For Goal Achievement: 04/16/23 Progress towards PT goals: Progressing toward goals    Frequency    Min 1X/week      PT Plan       Co-evaluation              AM-PAC PT "6 Clicks" Mobility   Outcome Measure  Help needed turning from your back to your side while in a flat bed without using bedrails?: None Help needed moving from lying on your back to sitting on the side of a flat bed without using bedrails?: None Help needed moving to and from a bed to a chair (including a wheelchair)?: A Little Help needed standing up from a chair using your arms (e.g., wheelchair or bedside chair)?: A Little Help needed to walk in hospital room?: A Little Help needed climbing 3-5 steps with a railing? : A Lot 6 Click Score: 19    End of Session Equipment Utilized During Treatment: Gait belt Activity Tolerance: Patient tolerated treatment well Patient left: with call bell/phone within reach;in bed;with family/visitor present (seated EOB) Nurse Communication: Mobility status PT Visit Diagnosis: Unsteadiness on feet (R26.81);Other abnormalities of gait and mobility (R26.89)     Time: 9528-4132 PT Time Calculation (min) (ACUTE ONLY): 20 min  Charges:    $Gait Training: 8-22 mins PT General Charges $$ ACUTE PT VISIT: 1 Visit                     Whitaker Holderman R. PTA Acute Rehabilitation Services Office: [phone]   Catalina Antigua 04/05/2023, 11:20 AM

## 2023-04-07 ENCOUNTER — Other Ambulatory Visit: Payer: Self-pay

## 2023-04-10 ENCOUNTER — Other Ambulatory Visit: Payer: Self-pay

## 2023-04-10 ENCOUNTER — Encounter (HOSPITAL_COMMUNITY): Payer: Self-pay | Admitting: *Deleted

## 2023-04-10 ENCOUNTER — Emergency Department (HOSPITAL_COMMUNITY): Payer: PRIVATE HEALTH INSURANCE

## 2023-04-10 ENCOUNTER — Inpatient Hospital Stay (HOSPITAL_COMMUNITY)
Admission: EM | Admit: 2023-04-10 | Discharge: 2023-04-12 | DRG: 641 | Disposition: A | Discharge status: Home / Self Care | Payer: PRIVATE HEALTH INSURANCE | Attending: Internal Medicine | Admitting: Internal Medicine

## 2023-04-10 DIAGNOSIS — Z886 Allergy status to analgesic agent status: Secondary | ICD-10-CM

## 2023-04-10 DIAGNOSIS — R7303 Prediabetes: Secondary | ICD-10-CM | POA: Diagnosis present

## 2023-04-10 DIAGNOSIS — Z8544 Personal history of malignant neoplasm of other female genital organs: Secondary | ICD-10-CM

## 2023-04-10 DIAGNOSIS — F411 Generalized anxiety disorder: Secondary | ICD-10-CM | POA: Diagnosis present

## 2023-04-10 DIAGNOSIS — Z6831 Body mass index (BMI) 31.0-31.9, adult: Secondary | ICD-10-CM

## 2023-04-10 DIAGNOSIS — Z83719 Family history of colon polyps, unspecified: Secondary | ICD-10-CM

## 2023-04-10 DIAGNOSIS — E785 Hyperlipidemia, unspecified: Secondary | ICD-10-CM | POA: Diagnosis present

## 2023-04-10 DIAGNOSIS — F429 Obsessive-compulsive disorder, unspecified: Secondary | ICD-10-CM | POA: Diagnosis present

## 2023-04-10 DIAGNOSIS — R569 Unspecified convulsions: Secondary | ICD-10-CM

## 2023-04-10 DIAGNOSIS — E66811 Obesity, class 1: Secondary | ICD-10-CM | POA: Diagnosis present

## 2023-04-10 DIAGNOSIS — Z885 Allergy status to narcotic agent status: Secondary | ICD-10-CM

## 2023-04-10 DIAGNOSIS — Z813 Family history of other psychoactive substance abuse and dependence: Secondary | ICD-10-CM

## 2023-04-10 DIAGNOSIS — Z808 Family history of malignant neoplasm of other organs or systems: Secondary | ICD-10-CM

## 2023-04-10 DIAGNOSIS — E669 Obesity, unspecified: Secondary | ICD-10-CM | POA: Diagnosis present

## 2023-04-10 DIAGNOSIS — K219 Gastro-esophageal reflux disease without esophagitis: Secondary | ICD-10-CM | POA: Diagnosis present

## 2023-04-10 DIAGNOSIS — Z8249 Family history of ischemic heart disease and other diseases of the circulatory system: Secondary | ICD-10-CM

## 2023-04-10 DIAGNOSIS — Z8619 Personal history of other infectious and parasitic diseases: Secondary | ICD-10-CM

## 2023-04-10 DIAGNOSIS — Z833 Family history of diabetes mellitus: Secondary | ICD-10-CM

## 2023-04-10 DIAGNOSIS — Z79811 Long term (current) use of aromatase inhibitors: Secondary | ICD-10-CM

## 2023-04-10 DIAGNOSIS — G43709 Chronic migraine without aura, not intractable, without status migrainosus: Secondary | ICD-10-CM | POA: Diagnosis present

## 2023-04-10 DIAGNOSIS — Z923 Personal history of irradiation: Secondary | ICD-10-CM

## 2023-04-10 DIAGNOSIS — E876 Hypokalemia: Secondary | ICD-10-CM | POA: Diagnosis present

## 2023-04-10 DIAGNOSIS — E0590 Thyrotoxicosis, unspecified without thyrotoxic crisis or storm: Secondary | ICD-10-CM | POA: Diagnosis present

## 2023-04-10 DIAGNOSIS — F319 Bipolar disorder, unspecified: Secondary | ICD-10-CM | POA: Diagnosis present

## 2023-04-10 DIAGNOSIS — I10 Essential (primary) hypertension: Secondary | ICD-10-CM | POA: Diagnosis present

## 2023-04-10 DIAGNOSIS — Z801 Family history of malignant neoplasm of trachea, bronchus and lung: Secondary | ICD-10-CM

## 2023-04-10 DIAGNOSIS — F4310 Post-traumatic stress disorder, unspecified: Secondary | ICD-10-CM | POA: Diagnosis present

## 2023-04-10 DIAGNOSIS — R42 Dizziness and giddiness: Secondary | ICD-10-CM

## 2023-04-10 DIAGNOSIS — F17210 Nicotine dependence, cigarettes, uncomplicated: Secondary | ICD-10-CM | POA: Diagnosis present

## 2023-04-10 DIAGNOSIS — Z90710 Acquired absence of both cervix and uterus: Secondary | ICD-10-CM

## 2023-04-10 DIAGNOSIS — Z8711 Personal history of peptic ulcer disease: Secondary | ICD-10-CM

## 2023-04-10 DIAGNOSIS — Z818 Family history of other mental and behavioral disorders: Secondary | ICD-10-CM

## 2023-04-10 DIAGNOSIS — R9431 Abnormal electrocardiogram [ECG] [EKG]: Secondary | ICD-10-CM | POA: Diagnosis present

## 2023-04-10 DIAGNOSIS — Z811 Family history of alcohol abuse and dependence: Secondary | ICD-10-CM

## 2023-04-10 DIAGNOSIS — F1021 Alcohol dependence, in remission: Secondary | ICD-10-CM | POA: Diagnosis present

## 2023-04-10 DIAGNOSIS — Z87442 Personal history of urinary calculi: Secondary | ICD-10-CM

## 2023-04-10 DIAGNOSIS — M1990 Unspecified osteoarthritis, unspecified site: Secondary | ICD-10-CM | POA: Diagnosis present

## 2023-04-10 DIAGNOSIS — Z79899 Other long term (current) drug therapy: Secondary | ICD-10-CM

## 2023-04-10 DIAGNOSIS — G2581 Restless legs syndrome: Secondary | ICD-10-CM | POA: Diagnosis present

## 2023-04-10 DIAGNOSIS — Z9012 Acquired absence of left breast and nipple: Secondary | ICD-10-CM

## 2023-04-10 DIAGNOSIS — Z881 Allergy status to other antibiotic agents status: Secondary | ICD-10-CM

## 2023-04-10 LAB — PHOSPHORUS: Phosphorus: 3 mg/dL (ref 2.5–4.6)

## 2023-04-10 LAB — COMPREHENSIVE METABOLIC PANEL
ALT: 49 U/L — ABNORMAL HIGH (ref 0–44)
AST: 38 U/L (ref 15–41)
Albumin: 3.6 g/dL (ref 3.5–5.0)
Alkaline Phosphatase: 39 U/L (ref 38–126)
Anion gap: 12 (ref 5–15)
BUN: 5 mg/dL — ABNORMAL LOW (ref 6–20)
CO2: 25 mmol/L (ref 22–32)
Calcium: 9.9 mg/dL (ref 8.9–10.3)
Chloride: 103 mmol/L (ref 98–111)
Creatinine, Ser: 0.77 mg/dL (ref 0.44–1.00)
GFR, Estimated: >60 mL/min (ref >60)
Glucose, Bld: 123 mg/dL — ABNORMAL HIGH (ref 70–99)
Potassium: 2.8 mmol/L — ABNORMAL LOW (ref 3.5–5.1)
Sodium: 140 mmol/L (ref 135–145)
Total Bilirubin: 1 mg/dL (ref 0.3–1.2)
Total Protein: 6.8 g/dL (ref 6.5–8.1)

## 2023-04-10 LAB — CBC WITH DIFFERENTIAL/PLATELET
Abs Immature Granulocytes: 0.1 10*3/uL — ABNORMAL HIGH (ref 0.00–0.07)
Basophils Absolute: 0 10*3/uL (ref 0.0–0.1)
Basophils Relative: 0 %
Eosinophils Absolute: 0.1 10*3/uL (ref 0.0–0.5)
Eosinophils Relative: 1 %
HCT: 37.4 % (ref 36.0–46.0)
Hemoglobin: 12.1 g/dL (ref 12.0–15.0)
Immature Granulocytes: 1 %
Lymphocytes Relative: 17 %
Lymphs Abs: 2.4 10*3/uL (ref 0.7–4.0)
MCH: 29.1 pg (ref 26.0–34.0)
MCHC: 32.4 g/dL (ref 30.0–36.0)
MCV: 89.9 fL (ref 80.0–100.0)
Monocytes Absolute: 0.7 10*3/uL (ref 0.1–1.0)
Monocytes Relative: 5 %
Neutro Abs: 10.6 10*3/uL — ABNORMAL HIGH (ref 1.7–7.7)
Neutrophils Relative %: 76 %
Platelets: 352 10*3/uL (ref 150–400)
RBC: 4.16 MIL/uL (ref 3.87–5.11)
RDW: 13.7 % (ref 11.5–15.5)
WBC: 13.9 10*3/uL — ABNORMAL HIGH (ref 4.0–10.5)
nRBC: 0 % (ref 0.0–0.2)

## 2023-04-10 LAB — HEMOGLOBIN A1C
Hgb A1c MFr Bld: 5.3 % (ref 4.8–5.6)
Mean Plasma Glucose: 105.41 mg/dL

## 2023-04-10 LAB — BASIC METABOLIC PANEL
Anion gap: 13 (ref 5–15)
BUN: <5 mg/dL — ABNORMAL LOW (ref 6–20)
CO2: 25 mmol/L (ref 22–32)
Calcium: 9.4 mg/dL (ref 8.9–10.3)
Chloride: 104 mmol/L (ref 98–111)
Creatinine, Ser: 0.59 mg/dL (ref 0.44–1.00)
GFR, Estimated: >60 mL/min (ref >60)
Glucose, Bld: 100 mg/dL — ABNORMAL HIGH (ref 70–99)
Potassium: 3.6 mmol/L (ref 3.5–5.1)
Sodium: 142 mmol/L (ref 135–145)

## 2023-04-10 LAB — MAGNESIUM: Magnesium: <0.5 mg/dL — CL (ref 1.7–2.4)

## 2023-04-10 LAB — GLUCOSE, CAPILLARY: Glucose-Capillary: 126 mg/dL — ABNORMAL HIGH (ref 70–99)

## 2023-04-10 MED ORDER — AMLODIPINE BESYLATE 5 MG PO TABS
5.0000 mg | ORAL_TABLET | Freq: Every day | ORAL | Status: DC
  Administered 2023-04-10 – 2023-04-12 (×3): 5 mg via ORAL
  Filled 2023-04-10 (×3): qty 1

## 2023-04-10 MED ORDER — POTASSIUM CHLORIDE CRYS ER 20 MEQ PO TBCR
40.0000 meq | EXTENDED_RELEASE_TABLET | Freq: Once | ORAL | Status: AC
  Administered 2023-04-10: 40 meq via ORAL
  Filled 2023-04-10: qty 2

## 2023-04-10 MED ORDER — DIAZEPAM 5 MG/ML IJ SOLN
5.0000 mg | Freq: Once | INTRAMUSCULAR | Status: AC
  Administered 2023-04-10: 5 mg via INTRAVENOUS
  Filled 2023-04-10: qty 2

## 2023-04-10 MED ORDER — LISINOPRIL 5 MG PO TABS
10.0000 mg | ORAL_TABLET | Freq: Every day | ORAL | Status: DC
  Administered 2023-04-10 – 2023-04-12 (×3): 10 mg via ORAL
  Filled 2023-04-10 (×3): qty 2
  Filled 2023-04-10: qty 1

## 2023-04-10 MED ORDER — PANTOPRAZOLE SODIUM 40 MG PO TBEC
40.0000 mg | DELAYED_RELEASE_TABLET | Freq: Every day | ORAL | Status: DC
  Administered 2023-04-10 – 2023-04-12 (×3): 40 mg via ORAL
  Filled 2023-04-10 (×3): qty 1

## 2023-04-10 MED ORDER — MAGNESIUM SULFATE 4 GM/100ML IV SOLN
4.0000 g | Freq: Once | INTRAVENOUS | Status: AC
  Administered 2023-04-10: 4 g via INTRAVENOUS
  Filled 2023-04-10: qty 100

## 2023-04-10 MED ORDER — PROCHLORPERAZINE EDISYLATE 10 MG/2ML IJ SOLN: 10.0000 mg | Freq: Four times a day (QID) | INTRAMUSCULAR | Status: DC | PRN

## 2023-04-10 MED ORDER — LETROZOLE 2.5 MG PO TABS
2.5000 mg | ORAL_TABLET | Freq: Every day | ORAL | Status: DC
  Administered 2023-04-10 – 2023-04-12 (×3): 2.5 mg via ORAL
  Filled 2023-04-10 (×3): qty 1

## 2023-04-10 MED ORDER — POTASSIUM CHLORIDE 10 MEQ/100ML IV SOLN
10.0000 meq | INTRAVENOUS | Status: AC
  Administered 2023-04-10 (×2): 10 meq via INTRAVENOUS
  Filled 2023-04-10 (×2): qty 100

## 2023-04-10 MED ORDER — LORAZEPAM 1 MG PO TABS: 1.0000 mg | ORAL_TABLET | Freq: Once | ORAL | Status: AC | PRN

## 2023-04-10 MED ORDER — UBROGEPANT 100 MG PO TABS: 100.0000 mg | ORAL_TABLET | ORAL | Status: DC | PRN

## 2023-04-10 MED ORDER — MAGNESIUM OXIDE -MG SUPPLEMENT 400 (240 MG) MG PO TABS
800.0000 mg | ORAL_TABLET | Freq: Once | ORAL | Status: AC
  Administered 2023-04-10: 800 mg via ORAL
  Filled 2023-04-10: qty 2

## 2023-04-10 MED ORDER — SENNOSIDES-DOCUSATE SODIUM 8.6-50 MG PO TABS: 2.0000 | ORAL_TABLET | Freq: Every evening | ORAL | Status: DC | PRN

## 2023-04-10 MED ORDER — LACTATED RINGERS IV BOLUS
1000.0000 mL | Freq: Once | INTRAVENOUS | Status: AC
  Administered 2023-04-10: 1000 mL via INTRAVENOUS

## 2023-04-10 MED ORDER — SPIRONOLACTONE 12.5 MG HALF TABLET
12.5000 mg | ORAL_TABLET | Freq: Every day | ORAL | Status: DC
  Administered 2023-04-10 – 2023-04-12 (×3): 12.5 mg via ORAL
  Filled 2023-04-10 (×3): qty 1

## 2023-04-10 MED ORDER — ACETAMINOPHEN 325 MG PO TABS
650.0000 mg | ORAL_TABLET | Freq: Four times a day (QID) | ORAL | Status: DC | PRN
  Administered 2023-04-10: 650 mg via ORAL
  Filled 2023-04-10: qty 2

## 2023-04-10 MED ORDER — PROPRANOLOL HCL 10 MG PO TABS
10.0000 mg | ORAL_TABLET | Freq: Two times a day (BID) | ORAL | Status: DC
  Administered 2023-04-10 – 2023-04-12 (×5): 10 mg via ORAL
  Filled 2023-04-10 (×5): qty 1

## 2023-04-10 MED ORDER — LETROZOLE 2.5 MG PO TABS: 2.5000 mg | ORAL_TABLET | Freq: Every day | ORAL | Status: DC

## 2023-04-10 MED ORDER — LEVETIRACETAM 500 MG PO TABS
750.0000 mg | ORAL_TABLET | Freq: Two times a day (BID) | ORAL | Status: DC
  Administered 2023-04-10 – 2023-04-12 (×5): 750 mg via ORAL
  Filled 2023-04-10 (×5): qty 1

## 2023-04-10 MED ORDER — POTASSIUM CHLORIDE IN NACL 40-0.9 MEQ/L-% IV SOLN
INTRAVENOUS | Status: DC
  Filled 2023-04-10 (×2): qty 1000

## 2023-04-10 MED ORDER — ACETAMINOPHEN 650 MG RE SUPP: 650.0000 mg | Freq: Four times a day (QID) | RECTAL | Status: DC | PRN

## 2023-04-10 MED ORDER — METHIMAZOLE 5 MG PO TABS
5.0000 mg | ORAL_TABLET | Freq: Every day | ORAL | Status: DC
  Administered 2023-04-10 – 2023-04-12 (×3): 5 mg via ORAL
  Filled 2023-04-10 (×3): qty 1

## 2023-04-10 MED ORDER — LORAZEPAM 2 MG/ML IJ SOLN: 2.0000 mg | INTRAMUSCULAR | Status: DC | PRN

## 2023-04-10 MED ORDER — OLANZAPINE 5 MG PO TABS
15.0000 mg | ORAL_TABLET | Freq: Every day | ORAL | Status: DC
  Administered 2023-04-10: 15 mg via ORAL
  Filled 2023-04-10: qty 3

## 2023-04-10 MED ORDER — FLUOXETINE HCL 20 MG PO CAPS
30.0000 mg | ORAL_CAPSULE | Freq: Every day | ORAL | Status: DC
  Administered 2023-04-10 – 2023-04-12 (×3): 30 mg via ORAL
  Filled 2023-04-10 (×3): qty 1

## 2023-04-10 NOTE — ED Notes (Signed)
ED TO INPATIENT HANDOFF REPORT  ED Nurse Name and Phone #: Beatris Ship RN [phone]  S Name/Age/Gender Sandra Miller 47 y.o. female Room/Bed: 001C/001C  Code Status   Code Status: Full Code  Home/SNF/Other Home Patient oriented to: self, place, time, and situation Is this baseline? Yes   Triage Complete: Triage complete  Chief Complaint Hypomagnesemia [E83.42]  Triage Note Patient presents to ed via [facility] states she was seen in the ED last week for seizure of which she has no history. States she was discharge on wed and started c/o weakness and dizziness  onset yest. worse when she lifts her head up. Per ems patient was ambulatory on scene. C/o n/v   Allergies Allergies  Allergen Reactions   Dilaudid [Hydromorphone Hcl] Other (See Comments)    Pt became confused, pulled out iv's, does not remember anything   Aspirin Hives    States able to tolerate Goody Powders and Ibuprofen without any problem    Depakote [Divalproex Sodium] Nausea And Vomiting   Minocycline Hives    Level of Care/Admitting Diagnosis ED Disposition     ED Disposition  Admit   Condition  --   Comment  Hospital Area: [facility] [[id number]]  Level of Care: Telemetry Medical [104]  May place patient in observation at [facility] or [facility] if equivalent level of care is available:: No  Covid Evaluation: Asymptomatic - no recent exposure (last 10 days) testing not required  Diagnosis: Hypomagnesemia [960454]  Admitting Physician: Bobette Mo [[id number]]  Attending Physician: Bobette Mo [[id number]]          B Medical/Surgery History Past Medical History:  Diagnosis Date   Arthritis    Bipolar 1 disorder (HCC)    Cancer (HCC)    vulva, and breast   Complication of anesthesia    wakes up during procedures   Depression    GAD (generalized anxiety disorder)    Genital HSV    currently per pt  no break out 03-22-2016    GERD (gastroesophageal reflux disease)     Graves disease    Hiatal hernia    History of cervical dysplasia    2012 laser ablation   History of esophageal dilatation    for dysphasia -- x2 dilated   History of gastric ulcer    History of Helicobacter pylori infection    remote hx   History of hidradenitis suppurativa    "gets all over body intermittantly"     History of hypertension    no issue since stopped drinking alcohol 2014   History of kidney stones    History of panic attacks    History of radiation therapy 03/16/2020-05/08/2020   vulva  Dr Antony Blackbird   History of radiation therapy 09/09/2020-10/23/2020   left chest wall/left SCV   Dr Antony Blackbird   Hypertension    Iron deficiency anemia    Left ureteral stone    OCD (obsessive compulsive disorder)    PONV (postoperative nausea and vomiting)    Pre-diabetes    PTSD (post-traumatic stress disorder)    Recovering alcoholic in remission (HCC)    since 2014   RLS (restless legs syndrome)    Smokers' cough (HCC)    Thyroid disease    Urgency of urination    Yeast infection involving the vagina and surrounding area    secondary to taking antibiotic   Past Surgical History:  Procedure Laterality Date   CESAREAN SECTION  1995  w/  Bilateral Tubal Ligation   COLONOSCOPY  last one 08-09-2013   CYSTOSCOPY W/ URETERAL STENT PLACEMENT Left 03/29/2016   Procedure: CYSTOSCOPY WITH STENT REPLACEMENT;  Surgeon: Hildred Laser, MD;  Location: [facility];  Service: Urology;  Laterality: Left;   CYSTOSCOPY WITH RETROGRADE PYELOGRAM, URETEROSCOPY AND STENT PLACEMENT Left 03/08/2016   Procedure: CYSTOSCOPY WITH  LEFT RETROGRADE PYELOGRAM, AND STENT PLACEMENT;  Surgeon: Hildred Laser, MD;  Location: [facility];  Service: Urology;  Laterality: Left;   CYSTOSCOPY/RETROGRADE/URETEROSCOPY/STONE EXTRACTION WITH BASKET Left 03/29/2016   Procedure: CYSTOSCOPY/RETROGRADE/URETEROSCOPY/STONE EXTRACTION WITH BASKET;  Surgeon: Hildred Laser, MD;  Location: [facility];  Service: Urology;  Laterality: Left;   ENDOMETRIAL ABLATION W/ NOVASURE  04-01-2010   ESOPHAGOGASTRODUODENOSCOPY  last one 08-09-2013   KNEE ARTHROSCOPY Left as teen   LASER ABLATION OF THE CERVIX  2012 approx   MASTECTOMY WITH AXILLARY LYMPH NODE DISSECTION Left 07/27/2020   Procedure: LEFT MASTECTOMY WITH LEFT RADIOACTIVE SEED GUIDED TARGETED AXILLARY LYMPH NODE DISSECTION;  Surgeon: Ovidio Kin, MD;  Location: [facility];  Service: General;  Laterality: Left;   PORT-A-CATH REMOVAL Right 05/18/2021   Procedure: REMOVAL PORT-A-CATH;  Surgeon: Manus Rudd, MD;  Location: [facility];  Service: General;  Laterality: Right;   PORTACATH PLACEMENT Right 03/06/2020   Procedure: INSERTION PORT-A-CATH WITH ULTRASOUND GUIDANCE;  Surgeon: Ovidio Kin, MD;  Location: [facility];  Service: General;  Laterality: Right;   ROBOTIC ASSISTED TOTAL HYSTERECTOMY WITH BILATERAL SALPINGO OOPHERECTOMY N/A 05/18/2021   Procedure: XI ROBOTIC ASSISTED TOTAL HYSTERECTOMY WITH BILATERAL SALPINGO OOPHORECTOMY;  Surgeon: Adolphus Birchwood, MD;  Location: [facility];  Service: Gynecology;  Laterality: N/A;   TRANSTHORACIC ECHOCARDIOGRAM  05-19-2006   lvsf normal, ef 55-65%, there was mild flattening of the interventricular septum during diastoli/  RV size at upper limits normal   TUBAL LIGATION     VULVA /PERINEUM BIOPSY N/A 05/18/2021   Procedure: VULVAR BIOPSY;  Surgeon: Adolphus Birchwood, MD;  Location: [facility];  Service: Gynecology;  Laterality: N/A;   WISDOM TOOTH EXTRACTION  age 34 's     A IV Location/Drains/Wounds Patient Lines/Drains/Airways Status     Active Line/Drains/Airways     Name Placement date Placement time Site Days   Implanted Port 06/17/20 Right Chest 06/17/20  0739  Chest  1027   Peripheral IV 04/10/23 20 G Anterior;Right Forearm 04/10/23  0914  Forearm  less than 1   Closed System Drain 1 Left;Lateral Breast Bulb (JP) 19 Fr. 07/27/20  1533  Breast  987   Closed System Drain 2  Left;Medial Chest Bulb (JP) 19 Fr. 07/27/20  1534  Chest  987   Incision - 5 Ports Abdomen 1: Right;Lateral 2: Umbilicus;Upper 3: Left;Lateral;Upper 4: Left;Lateral;Mid 5: Left;Lateral;Lower 05/18/21  0805  -- 692            Intake/Output Last 24 hours No intake or output data in the 24 hours ending 04/10/23 1224  Labs/Imaging Results for orders placed or performed during the hospital encounter of 04/10/23 (from the past 48 hour(s))  CBC with Differential     Status: Abnormal   Collection Time: 04/10/23  9:40 AM  Result Value Ref Range   WBC 13.9 (H) 4.0 - 10.5 K/uL   RBC 4.16 3.87 - 5.11 MIL/uL   Hemoglobin 12.1 12.0 - 15.0 g/dL   HCT 16.1 09.6 - 04.5 %   MCV 89.9 80.0 - 100.0 fL   MCH 29.1 26.0 - 34.0 pg  MCHC 32.4 30.0 - 36.0 g/dL   RDW 66.4 40.3 - 47.4 %   Platelets 352 150 - 400 K/uL   nRBC 0.0 0.0 - 0.2 %   Neutrophils Relative % 76 %   Neutro Abs 10.6 (H) 1.7 - 7.7 K/uL   Lymphocytes Relative 17 %   Lymphs Abs 2.4 0.7 - 4.0 K/uL   Monocytes Relative 5 %   Monocytes Absolute 0.7 0.1 - 1.0 K/uL   Eosinophils Relative 1 %   Eosinophils Absolute 0.1 0.0 - 0.5 K/uL   Basophils Relative 0 %   Basophils Absolute 0.0 0.0 - 0.1 K/uL   Immature Granulocytes 1 %   Abs Immature Granulocytes 0.10 (H) 0.00 - 0.07 K/uL    Comment: Performed at [facility], [address], [place], [place] [address]  Comprehensive metabolic panel     Status: Abnormal   Collection Time: 04/10/23  9:40 AM  Result Value Ref Range   Sodium 140 135 - 145 mmol/L   Potassium 2.8 (L) 3.5 - 5.1 mmol/L   Chloride 103 98 - 111 mmol/L   CO2 25 22 - 32 mmol/L   Glucose, Bld 123 (H) 70 - 99 mg/dL    Comment: Glucose reference range applies only to samples taken after fasting for at least 8 hours.   BUN 5 (L) 6 - 20 mg/dL   Creatinine, Ser 3.87 0.44 - 1.00 mg/dL   Calcium 9.9 8.9 - 56.4 mg/dL   Total Protein 6.8 6.5 - 8.1 g/dL   Albumin 3.6 3.5 - 5.0 g/dL   AST 38 15 - 41 U/L   ALT 49 (H) 0 -  44 U/L   Alkaline Phosphatase 39 38 - 126 U/L   Total Bilirubin 1.0 0.3 - 1.2 mg/dL   GFR, Estimated >33 >29 mL/min    Comment: (NOTE) Calculated using the CKD-EPI Creatinine Equation (2021)    Anion gap 12 5 - 15    Comment: Performed at [facility], [address] [place], [place] [address]  Magnesium     Status: Abnormal   Collection Time: 04/10/23  9:40 AM  Result Value Ref Range   Magnesium <0.5 (LL) 1.7 - 2.4 mg/dL    Comment: REPEATED TO VERIFY CRITICAL RESULT CALLED TO, READ BACK BY AND VERIFIED WITH P.,RN 1026 04/10/23 CLARK,S Performed at [facility], [address] [place], [place] [address]   Phosphorus     Status: None   Collection Time: 04/10/23  9:40 AM  Result Value Ref Range   Phosphorus 3.0 2.5 - 4.6 mg/dL    Comment: Performed at [facility], [address] [place], [place] [address]   *Note: Due to a large number of results and/or encounters for the requested time period, some results have not been displayed. A complete set of results can be found in Results Review.   CT Head Wo Contrast  Result Date: 04/10/2023 CLINICAL DATA:  Neuro deficit, acute, stroke suspected. EXAM: CT HEAD WITHOUT CONTRAST TECHNIQUE: Contiguous axial images were obtained from the base of the skull through the vertex without intravenous contrast. RADIATION DOSE REDUCTION: This exam was performed according to the departmental dose-optimization program which includes automated exposure control, adjustment of the mA and/or kV according to patient size and/or use of iterative reconstruction technique. COMPARISON:  Head CT 04/01/2023. FINDINGS: Brain: No acute intracranial hemorrhage. Gray-white differentiation is preserved. No hydrocephalus or extra-axial collection. No mass effect or midline shift. Unchanged partially empty sella.  Vascular: No hyperdense vessel or unexpected calcification. Skull: No calvarial fracture or suspicious bone lesion. Skull base is unremarkable.  Sinuses/Orbits: No acute finding. Other: None. IMPRESSION: No acute intracranial abnormality. Electronically Signed   By: Orvan Falconer M.D.   On: 04/10/2023 10:52    Pending Labs Unresulted Labs (From admission, onward)     Start     Ordered   04/11/23 0500  HIV Antibody (routine testing w rflx)  (HIV Antibody (Routine testing w reflex) panel)  Tomorrow morning,   R        04/10/23 1222   04/11/23 0500  Comprehensive metabolic panel  Tomorrow morning,   R        04/10/23 1222   04/11/23 0500  CBC  Tomorrow morning,   R        04/10/23 1222   04/10/23 1500  Basic metabolic panel  Once-Timed,   TIMED        04/10/23 1133            Vitals/Pain Today's Vitals   04/10/23 1030 04/10/23 1045 04/10/23 1115 04/10/23 1130  BP: (!) 155/93 (!) 160/95 (!) 156/93 (!) 156/86  Pulse: (!) 101 92 88 89  Resp: 14 16 16  (!) 23  Temp:      TempSrc:      SpO2: 100% 98% 100% 98%  Weight:      Height:      PainSc:        Isolation Precautions No active isolations  Medications Medications  magnesium sulfate IVPB 4 g 100 mL (4 g Intravenous New Bag/Given 04/10/23 1100)  potassium chloride 10 mEq in 100 mL IVPB (10 mEq Intravenous New Bag/Given 04/10/23 1201)  0.9 % NaCl with KCl 40 mEq / L  infusion (has no administration in time range)  levETIRAcetam (KEPPRA) tablet 750 mg (has no administration in time range)  amLODipine (NORVASC) tablet 5 mg (has no administration in time range)  FLUoxetine (PROZAC) capsule 30 mg (has no administration in time range)  letrozole [facility]) tablet 2.5 mg (has no administration in time range)  lisinopril (ZESTRIL) tablet 10 mg (has no administration in time range)  pantoprazole (PROTONIX) EC tablet 40 mg (has no administration in time range)  OLANZapine (ZYPREXA) tablet 15 mg (has no administration in time range)  propranolol (INDERAL) tablet 10 mg (has no administration in time range)  senna-docusate (Senokot-S) tablet 2 tablet (has no administration in time  range)  spironolactone (ALDACTONE) tablet 12.5 mg (has no administration in time range)  Ubrogepant TABS 100 mg (has no administration in time range)  acetaminophen (TYLENOL) tablet 650 mg (has no administration in time range)    Or  acetaminophen (TYLENOL) suppository 650 mg (has no administration in time range)  prochlorperazine (COMPAZINE) injection 10 mg (has no administration in time range)  lactated ringers bolus 1,000 mL (0 mLs Intravenous Stopped 04/10/23 1059)  diazepam (VALIUM) injection 5 mg (5 mg Intravenous Given 04/10/23 0937)  magnesium oxide (MAG-OX) tablet 800 mg (800 mg Oral Given 04/10/23 1058)  potassium chloride SA (KLOR-CON M) CR tablet 40 mEq (40 mEq Oral Given 04/10/23 1058)    Mobility Walks at baseline, feeling dizzy has not ambulated here in the ED      R Recommendations: See Admitting Provider Note  Report given to: 1O10

## 2023-04-10 NOTE — ED Notes (Signed)
Patient transported to CT 

## 2023-04-10 NOTE — ED Triage Notes (Signed)
Patient presents to ed via [facility] states she was seen in the ED last week for seizure of which she has no history. States she was discharge on wed and started c/o weakness and dizziness  onset yest. worse when she lifts her head up. Per ems patient was ambulatory on scene. C/o n/v

## 2023-04-10 NOTE — ED Provider Notes (Signed)
[place] EMERGENCY DEPARTMENT AT [facility] Provider Note   CSN: [id number] Arrival date & time: 04/10/23  5366     History  Chief Complaint  Patient presents with   Dizziness    Sandra Miller is a 47 y.o. female.  HPI 47 year old female presents with dizziness and nausea/vomiting.  Dizziness started yesterday.  Feels like she is going to pass out but also feels like she is moving.  No headache associated with it.  She was recently discharged from the hospital less than a week ago for new onset seizures.  She has been started on new meds but she is not sure what is new and what is not.  She denies any chest pain, shortness of breath.  She has been nauseated ever since leaving the hospital but started vomiting this morning around 7 AM.  Dizziness is also worse this morning.  No weakness or numbness in her extremities.  Home Medications Prior to Admission medications   Medication Sig Start Date End Date Taking? Authorizing Provider  amLODipine (NORVASC) 5 MG tablet Take 5 mg by mouth daily.    [provider]  diclofenac Sodium (VOLTAREN) 1 % GEL Apply 4 grams topically 4  times daily as needed. Patient taking differently: Apply 4 g topically 4 (four) times daily as needed (pain). 03/30/21   Hilts, Casimiro Needle, MD  FLUoxetine (PROZAC) 10 MG capsule Take 3 capsules (30 mg total) by mouth daily. Patient taking differently: Take 30 mg by mouth 3 (three) times daily. 03/02/23   Arfeen, Phillips Grout, MD  Fremanezumab-vfrm (AJOVY) 225 MG/1.5ML SOAJ Inject 225 mg into the skin every 30 (thirty) days. 02/16/23   Anson Fret, MD  gabapentin (NEURONTIN) 300 MG capsule Take 1 capsule (300 mg total) by mouth daily. Pt needs office visit. 09/29/22   Meredith Pel, NP  letrozole [facility]) 2.5 MG tablet Take 1 tablet by mouth daily. 12/01/22   Serena Croissant, MD  letrozole [facility]) 2.5 MG tablet Take 1 tablet (2.5 mg total) by mouth daily. Patient not taking: Reported on 03/31/2023  12/01/22   Serena Croissant, MD  levETIRAcetam (KEPPRA) 750 MG tablet Take 1 tablet (750 mg total) by mouth 2 (two) times daily. 04/05/23 05/05/23  Lanae Boast, MD  lisinopril (ZESTRIL) 10 MG tablet Take 1 tablet (10 mg total) by mouth daily. Stop the Lisinopril/HCTZ combination 12/08/22   Marcine Matar, MD  LORazepam (ATIVAN) 1 MG tablet Take 1 tablet (1 mg total) by mouth 3 (three) times daily as needed for anxiety Patient taking differently: Take 1 mg by mouth 3 (three) times daily as needed for anxiety or seizure. 03/02/23   Arfeen, Phillips Grout, MD  methimazole (TAPAZOLE) 5 MG tablet Take one tablet (5 mg dose) by mouth daily. Patient taking differently: Take 5 mg by mouth daily. 09/22/22     Multiple Vitamin (MULTIVITAMIN WITH MINERALS) TABS tablet Take 1 tablet by mouth daily.    [provider]  OLANZapine (ZYPREXA) 15 MG tablet Take 1 tablet (15 mg total) by mouth at bedtime. 03/02/23   Arfeen, Phillips Grout, MD  omeprazole (PRILOSEC) 40 MG capsule Take 1 capsule (40 mg total) by mouth in the morning and at bedtime. 09/29/22   Meredith Pel, NP  ondansetron (ZOFRAN-ODT) 8 MG disintegrating tablet Dissolve 1 tablet (8 mg total) by mouth every 8 (eight) hours as needed for nausea or vomiting. 09/14/22   Wallis Bamberg, PA-C  potassium chloride SA (KLOR-CON M) 20 MEQ tablet Take 1  tablet (20 mEq) by mouth daily. 12/23/22   Marcine Matar, MD  promethazine (PHENERGAN) 25 MG tablet Take 1 tablet (25 mg total) by mouth every 8 (eight) hours as needed for nausea or vomiting. 03/06/23   Mayers, Cari S, PA-C  propranolol (INDERAL) 10 MG tablet Take 1 tablet by mouth 2 (two) times daily. Patient not taking: Reported on 03/06/2023 12/06/21   Marcine Matar, MD  senna-docusate (SENOKOT-S) 8.6-50 MG tablet Take 2 tablets by mouth at bedtime. For AFTER surgery only, do not take if having diarrhea Patient taking differently: Take 2 tablets by mouth at bedtime as needed for moderate constipation. For AFTER surgery  only, do not take if having diarrhea 02/19/21   Warner Mccreedy D, NP  spironolactone (ALDACTONE) 25 MG tablet Take 0.5 tablets (12.5 mg total) by mouth daily. 03/02/23   Marcine Matar, MD  Ubrogepant (UBRELVY) 100 MG TABS Take 1 tablet (100 mg total) by mouth every 2 (two) hours as needed. Maximum 200mg  a day. Patient taking differently: Take 100 mg by mouth every 2 (two) hours as needed (migraine). Maximum 200mg  a day. 10/03/22   Anson Fret, MD  prochlorperazine (COMPAZINE) 10 MG tablet TAKE 1 TABLET BY MOUTH EVERY 6 HOURS AS NEEDED FOR NAUSEA OR VOMITING Patient taking differently: Take 10 mg by mouth every 6 (six) hours as needed for nausea or vomiting. 06/16/20 08/18/20  Serena Croissant, MD      Allergies    Dilaudid [hydromorphone hcl], Aspirin, Depakote [divalproex sodium], and Minocycline    Review of Systems   Review of Systems  Constitutional:  Negative for fever.  Respiratory:  Negative for shortness of breath.   Cardiovascular:  Negative for chest pain.  Gastrointestinal:  Positive for nausea and vomiting. Negative for abdominal pain and diarrhea.  Musculoskeletal:  Negative for neck pain.  Neurological:  Positive for dizziness. Negative for weakness and headaches.  Psychiatric/Behavioral:  The patient is nervous/anxious.     Physical Exam Updated Vital Signs BP (!) 147/80   Pulse 83   Temp 98.3 F (36.8 C) (Oral)   Resp 20   Ht 5' 5.5" (1.664 m)   Wt 87.5 kg   SpO2 98%   BMI 31.61 kg/m  Physical Exam Vitals and nursing note reviewed.  Constitutional:      General: She is not in acute distress.    Appearance: She is well-developed. She is not ill-appearing or diaphoretic.     Comments: Turning her head induces worse dizziness.  HENT:     Head: Normocephalic and atraumatic.  Eyes:     Extraocular Movements: Extraocular movements intact.     Left eye: No nystagmus.     Pupils: Pupils are equal, round, and reactive to light.  Cardiovascular:     Rate and  Rhythm: Normal rate and regular rhythm.     Heart sounds: Normal heart sounds.  Pulmonary:     Effort: Pulmonary effort is normal.     Breath sounds: Normal breath sounds.  Abdominal:     Palpations: Abdomen is soft.     Tenderness: There is no abdominal tenderness.  Musculoskeletal:     Cervical back: Normal range of motion.  Skin:    General: Skin is warm and dry.  Neurological:     Mental Status: She is alert.     Comments: CN 3-12 grossly intact. 5/5 strength in all 4 extremities. Grossly normal sensation. Normal finger to nose.      ED Results / Procedures /  Treatments   Labs (all labs ordered are listed, but only abnormal results are displayed) Labs Reviewed  CBC WITH DIFFERENTIAL/PLATELET - Abnormal; Notable for the following components:      Result Value   WBC 13.9 (*)    Neutro Abs 10.6 (*)    Abs Immature Granulocytes 0.10 (*)    All other components within normal limits  COMPREHENSIVE METABOLIC PANEL - Abnormal; Notable for the following components:   Potassium 2.8 (*)    Glucose, Bld 123 (*)    BUN 5 (*)    ALT 49 (*)    All other components within normal limits  MAGNESIUM - Abnormal; Notable for the following components:   Magnesium <0.5 (*)    All other components within normal limits  BASIC METABOLIC PANEL - Abnormal; Notable for the following components:   Glucose, Bld 100 (*)    BUN <5 (*)    All other components within normal limits  PHOSPHORUS  HEMOGLOBIN A1C    EKG EKG Interpretation Date/Time:  Monday April 10 2023 09:40:30 EDT Ventricular Rate:  100 PR Interval:  144 QRS Duration:  101 QT Interval:  434 QTC Calculation: 560 R Axis:   58  Text Interpretation: Sinus tachycardia Abnrm T, consider ischemia, anterolateral lds Prolonged QT interval HR lower than July 2024 Confirmed by Pricilla Loveless [phone]) on 04/10/2023 9:51:28 AM  Radiology CT Head Wo Contrast  Result Date: 04/10/2023 CLINICAL DATA:  Neuro deficit, acute, stroke suspected.  EXAM: CT HEAD WITHOUT CONTRAST TECHNIQUE: Contiguous axial images were obtained from the base of the skull through the vertex without intravenous contrast. RADIATION DOSE REDUCTION: This exam was performed according to the departmental dose-optimization program which includes automated exposure control, adjustment of the mA and/or kV according to patient size and/or use of iterative reconstruction technique. COMPARISON:  Head CT 04/01/2023. FINDINGS: Brain: No acute intracranial hemorrhage. Gray-white differentiation is preserved. No hydrocephalus or extra-axial collection. No mass effect or midline shift. Unchanged partially empty sella. Vascular: No hyperdense vessel or unexpected calcification. Skull: No calvarial fracture or suspicious bone lesion. Skull base is unremarkable. Sinuses/Orbits: No acute finding. Other: None. IMPRESSION: No acute intracranial abnormality. Electronically Signed   By: Orvan Falconer M.D.   On: 04/10/2023 10:52    Procedures .Critical Care  Performed by: Pricilla Loveless, MD Authorized by: Pricilla Loveless, MD   Critical care provider statement:    Critical care time (minutes):  32   Critical care time was exclusive of:  Separately billable procedures and treating other patients   Critical care was necessary to treat or prevent imminent or life-threatening deterioration of the following conditions:  Metabolic crisis   Critical care was time spent personally by me on the following activities:  Development of treatment plan with patient or surrogate, discussions with consultants, evaluation of patient's response to treatment, examination of patient, ordering and review of laboratory studies, ordering and review of radiographic studies, ordering and performing treatments and interventions, pulse oximetry, re-evaluation of patient's condition and review of old charts     Medications Ordered in ED Medications  0.9 % NaCl with KCl 40 mEq / L  infusion ( Intravenous New  Bag/Given 04/10/23 1301)  levETIRAcetam (KEPPRA) tablet 750 mg (750 mg Oral Given 04/10/23 1239)  amLODipine (NORVASC) tablet 5 mg (5 mg Oral Given 04/10/23 1240)  FLUoxetine (PROZAC) capsule 30 mg (30 mg Oral Given 04/10/23 1238)  letrozole [facility]) tablet 2.5 mg (2.5 mg Oral Given 04/10/23 1240)  lisinopril (ZESTRIL) tablet 10 mg (10  mg Oral Given 04/10/23 1239)  pantoprazole (PROTONIX) EC tablet 40 mg (40 mg Oral Given 04/10/23 1239)  OLANZapine (ZYPREXA) tablet 15 mg (has no administration in time range)  propranolol (INDERAL) tablet 10 mg (10 mg Oral Given 04/10/23 1245)  senna-docusate (Senokot-S) tablet 2 tablet (has no administration in time range)  spironolactone (ALDACTONE) tablet 12.5 mg (12.5 mg Oral Given 04/10/23 1239)  Ubrogepant TABS 100 mg (has no administration in time range)  acetaminophen (TYLENOL) tablet 650 mg (has no administration in time range)    Or  acetaminophen (TYLENOL) suppository 650 mg (has no administration in time range)  prochlorperazine (COMPAZINE) injection 10 mg (has no administration in time range)  methimazole (TAPAZOLE) tablet 5 mg (has no administration in time range)  LORazepam (ATIVAN) injection 2 mg (has no administration in time range)  lactated ringers bolus 1,000 mL (0 mLs Intravenous Stopped 04/10/23 1059)  diazepam (VALIUM) injection 5 mg (5 mg Intravenous Given 04/10/23 0937)  magnesium sulfate IVPB 4 g 100 mL (0 g Intravenous Stopped 04/10/23 1246)  magnesium oxide (MAG-OX) tablet 800 mg (800 mg Oral Given 04/10/23 1058)  potassium chloride SA (KLOR-CON M) CR tablet 40 mEq (40 mEq Oral Given 04/10/23 1058)  potassium chloride 10 mEq in 100 mL IVPB (0 mEq Intravenous Stopped 04/10/23 1300)    ED Course/ Medical Decision Making/ A&P                                 Medical Decision Making Amount and/or Complexity of Data Reviewed Labs: ordered.    Details: Severe hypomagnesemia.  Moderate hypokalemia. Radiology: ordered and independent interpretation performed.     Details: No head bleed. ECG/medicine tests: ordered and independent interpretation performed.    Details: Prolonged QTc.  Risk OTC drugs. Prescription drug management. Decision regarding hospitalization.   Patient presents with dizziness that sounds like vertigo along with nausea and vomiting.  Has significant metabolic abnormalities with a magnesium less than 0.5 and a potassium of 2.8.  I suspect these are contributing to her prolonged QTc.  She has not had syncope though feels lightheaded that also has symptoms that sound like vertigo.  No other focal neurofindings.  She was given some Valium with some partial relief.  She will need replacement of these electrolytes and so I have started her on IV and oral potassium and magnesium.  CT head unremarkable.  Discussed with Dr. Robb Matar for admission and need for cardiac monitoring.        Final Clinical Impression(s) / ED Diagnoses Final diagnoses:  Hypomagnesemia  Hypokalemia    Rx / DC Orders ED Discharge Orders     None         Pricilla Loveless, MD 04/10/23 1700

## 2023-04-10 NOTE — ED Notes (Signed)
Admitting at bedside 

## 2023-04-10 NOTE — H&P (Signed)
History and Physical    Patient: Sandra Miller QIO:[id number] DOB: August 29, 1976 DOA: 04/10/2023 DOS: the patient was seen and examined on 04/10/2023 PCP: Marcine Matar, MD  Patient coming from: Home  Chief Complaint:  Chief Complaint  Patient presents with   Dizziness   HPI: Sandra Miller is a 47 y.o. female with medical history significant of osteoarthritis, bipolar 1 disorder, cancer of vulva breast, history of radiation therapy, depression, generalized anxiety disorder, panic attacks, PTSD, genital HSV, GERD, hiatal hernia, gastric ulcer, history of esophageal dilatation, history of H. pylori infection, hidradenitis suppurativa, nephrolithiasis, Graves' disease, hypertension, iron deficiency anemia, prediabetes, restless leg syndrome, vaginal just infection who was recently discharged due to seizures 5 days ago and now is brought via EMS to the emergency department due to dizziness that started yesterday associated with nausea, emesis and unsteady gait.  The patient started vomiting this morning.  She stated that she feels like she is moving and now about to pass out sometimes.  No migraine headaches or other headaches in recent days.  No focal weakness, numbness or tingling.  She denied fever, chills, rhinorrhea, sore throat, wheezing or hemoptysis.  No chest pain, palpitations, diaphoresis, PND, orthopnea or pitting edema of the lower extremities.  No abdominal pain, diarrhea, constipation, melena or hematochezia.  No flank pain, dysuria, frequency or hematuria. No polyuria, polydipsia, polyphagia or blurred vision.   Lab work: CBC 0 white count 13.9, hemoglobin 12.1 g/dL platelets 324.  Magnesium was less than 0.5 mg/dL.  CMP showed a potassium of 2.8 mmol/L, ALT 49 units/L, glucose 123 and BUN 5 mg deciliter.  The rest of the CMP measurements were normal.  Imaging: CT head without contrast with no acute intracranial normality.   ED course: Initial vital signs were temperature 98.7 F,  pulse 98, respiration 20, BP 170/97 mmHg O2 sat 100% on room air.  The patient received LR 1000 mL bolus, diazepam 5 mg IVP x 1, magnesium oxide 100 mg p.o., KCl 40 mg p.o. x 1 and magnesium sulfate 4 g IVPB.  Review of Systems: As mentioned in the history of present illness. All other systems reviewed and are negative. Past Medical History:  Diagnosis Date   Arthritis    Bipolar 1 disorder (HCC)    Cancer (HCC)    vulva, and breast   Complication of anesthesia    wakes up during procedures   Depression    GAD (generalized anxiety disorder)    Genital HSV    currently per pt  no break out 03-22-2016    GERD (gastroesophageal reflux disease)    Graves disease    Hiatal hernia    History of cervical dysplasia    2012 laser ablation   History of esophageal dilatation    for dysphasia -- x2 dilated   History of gastric ulcer    History of Helicobacter pylori infection    remote hx   History of hidradenitis suppurativa    "gets all over body intermittantly"     History of hypertension    no issue since stopped drinking alcohol 2014   History of kidney stones    History of panic attacks    History of radiation therapy 03/16/2020-05/08/2020   vulva  Dr Antony Blackbird   History of radiation therapy 09/09/2020-10/23/2020   left chest wall/left SCV   Dr Antony Blackbird   Hypertension    Iron deficiency anemia    Left ureteral stone    OCD (obsessive compulsive disorder)  PONV (postoperative nausea and vomiting)    Pre-diabetes    PTSD (post-traumatic stress disorder)    Recovering alcoholic in remission (HCC)    since 2014   RLS (restless legs syndrome)    Smokers' cough (HCC)    Thyroid disease    Urgency of urination    Yeast infection involving the vagina and surrounding area    secondary to taking antibiotic   Past Surgical History:  Procedure Laterality Date   CESAREAN SECTION  1995   w/  Bilateral Tubal Ligation   COLONOSCOPY  last one 08-09-2013   CYSTOSCOPY W/ URETERAL  STENT PLACEMENT Left 03/29/2016   Procedure: CYSTOSCOPY WITH STENT REPLACEMENT;  Surgeon: Hildred Laser, MD;  Location: [facility];  Service: Urology;  Laterality: Left;   CYSTOSCOPY WITH RETROGRADE PYELOGRAM, URETEROSCOPY AND STENT PLACEMENT Left 03/08/2016   Procedure: CYSTOSCOPY WITH  LEFT RETROGRADE PYELOGRAM, AND STENT PLACEMENT;  Surgeon: Hildred Laser, MD;  Location: [facility];  Service: Urology;  Laterality: Left;   CYSTOSCOPY/RETROGRADE/URETEROSCOPY/STONE EXTRACTION WITH BASKET Left 03/29/2016   Procedure: CYSTOSCOPY/RETROGRADE/URETEROSCOPY/STONE EXTRACTION WITH BASKET;  Surgeon: Hildred Laser, MD;  Location: [facility];  Service: Urology;  Laterality: Left;   ENDOMETRIAL ABLATION W/ NOVASURE  04-01-2010   ESOPHAGOGASTRODUODENOSCOPY  last one 08-09-2013   KNEE ARTHROSCOPY Left as teen   LASER ABLATION OF THE CERVIX  2012 approx   MASTECTOMY WITH AXILLARY LYMPH NODE DISSECTION Left 07/27/2020   Procedure: LEFT MASTECTOMY WITH LEFT RADIOACTIVE SEED GUIDED TARGETED AXILLARY LYMPH NODE DISSECTION;  Surgeon: Ovidio Kin, MD;  Location: [facility];  Service: General;  Laterality: Left;   PORT-A-CATH REMOVAL Right 05/18/2021   Procedure: REMOVAL PORT-A-CATH;  Surgeon: Manus Rudd, MD;  Location: [facility];  Service: General;  Laterality: Right;   PORTACATH PLACEMENT Right 03/06/2020   Procedure: INSERTION PORT-A-CATH WITH ULTRASOUND GUIDANCE;  Surgeon: Ovidio Kin, MD;  Location: [facility];  Service: General;  Laterality: Right;   ROBOTIC ASSISTED TOTAL HYSTERECTOMY WITH BILATERAL SALPINGO OOPHERECTOMY N/A 05/18/2021   Procedure: XI ROBOTIC ASSISTED TOTAL HYSTERECTOMY WITH BILATERAL SALPINGO OOPHORECTOMY;  Surgeon: Adolphus Birchwood, MD;  Location: [facility];  Service: Gynecology;  Laterality: N/A;   TRANSTHORACIC ECHOCARDIOGRAM  05-19-2006   lvsf normal, ef 55-65%, there was mild flattening of the interventricular septum during diastoli/  RV size at  upper limits normal   TUBAL LIGATION     VULVA /PERINEUM BIOPSY N/A 05/18/2021   Procedure: VULVAR BIOPSY;  Surgeon: Adolphus Birchwood, MD;  Location: [facility];  Service: Gynecology;  Laterality: N/A;   WISDOM TOOTH EXTRACTION  age 13 's   Social History:  reports that she has been smoking cigarettes. She has a 11 pack-year smoking history. She has never used smokeless tobacco. She reports that she does not currently use alcohol. She reports current drug use. Drug: Marijuana.  Allergies  Allergen Reactions   Dilaudid [Hydromorphone Hcl] Other (See Comments)    Pt became confused, pulled out iv's, does not remember anything   Aspirin Hives    States able to tolerate Goody Powders and Ibuprofen without any problem    Depakote [Divalproex Sodium] Nausea And Vomiting   Minocycline Hives    Family History  Problem Relation Age of Onset   Heart disease Father    Lung cancer Father        d. 53   Alcohol abuse Father    Heart disease Mother    Depression Mother    Anxiety disorder Mother  Drug abuse Brother    Alcohol abuse Brother    Drug abuse Brother    ADD / ADHD Brother    Colon polyps Brother    Cancer Paternal Grandfather        "stomach"   Diabetes Maternal Grandfather    Diabetes Paternal Grandmother    Kidney disease Maternal Uncle    Cirrhosis Cousin        alcoholic   Anxiety disorder Maternal Aunt    Depression Maternal Aunt    Cancer Cousin        maternal; ovarian cancer or other "female" cancer?   Lung cancer Paternal Uncle 89   Throat cancer Cousin        paternal; dx 80s   Lung cancer Cousin        paternal; dx 103s    Prior to Admission medications   Medication Sig Start Date End Date Taking? Authorizing Provider  amLODipine (NORVASC) 5 MG tablet Take 5 mg by mouth daily.    [provider]  diclofenac Sodium (VOLTAREN) 1 % GEL Apply 4 grams topically 4  times daily as needed. Patient taking differently: Apply 4 g topically 4 (four) times daily as  needed (pain). 03/30/21   Hilts, Casimiro Needle, MD  FLUoxetine (PROZAC) 10 MG capsule Take 3 capsules (30 mg total) by mouth daily. Patient taking differently: Take 30 mg by mouth 3 (three) times daily. 03/02/23   Arfeen, Phillips Grout, MD  Fremanezumab-vfrm (AJOVY) 225 MG/1.5ML SOAJ Inject 225 mg into the skin every 30 (thirty) days. 02/16/23   Anson Fret, MD  gabapentin (NEURONTIN) 300 MG capsule Take 1 capsule (300 mg total) by mouth daily. Pt needs office visit. 09/29/22   Meredith Pel, NP  letrozole [facility]) 2.5 MG tablet Take 1 tablet by mouth daily. 12/01/22   Serena Croissant, MD  letrozole [employer]) 2.5 MG tablet Take 1 tablet (2.5 mg total) by mouth daily. Patient not taking: Reported on 03/31/2023 12/01/22   Serena Croissant, MD  levETIRAcetam (KEPPRA) 750 MG tablet Take 1 tablet (750 mg total) by mouth 2 (two) times daily. 04/05/23 05/05/23  Lanae Boast, MD  lisinopril (ZESTRIL) 10 MG tablet Take 1 tablet (10 mg total) by mouth daily. Stop the Lisinopril/HCTZ combination 12/08/22   Marcine Matar, MD  LORazepam (ATIVAN) 1 MG tablet Take 1 tablet (1 mg total) by mouth 3 (three) times daily as needed for anxiety Patient taking differently: Take 1 mg by mouth 3 (three) times daily as needed for anxiety or seizure. 03/02/23   Arfeen, Phillips Grout, MD  methimazole (TAPAZOLE) 5 MG tablet Take one tablet (5 mg dose) by mouth daily. Patient taking differently: Take 5 mg by mouth daily. 09/22/22     Multiple Vitamin (MULTIVITAMIN WITH MINERALS) TABS tablet Take 1 tablet by mouth daily.    [provider]  OLANZapine (ZYPREXA) 15 MG tablet Take 1 tablet (15 mg total) by mouth at bedtime. 03/02/23   Arfeen, Phillips Grout, MD  omeprazole (PRILOSEC) 40 MG capsule Take 1 capsule (40 mg total) by mouth in the morning and at bedtime. 09/29/22   Meredith Pel, NP  ondansetron (ZOFRAN-ODT) 8 MG disintegrating tablet Dissolve 1 tablet (8 mg total) by mouth every 8 (eight) hours as needed for nausea or vomiting. 09/14/22   Wallis Bamberg, PA-C  potassium chloride SA (KLOR-CON M) 20 MEQ tablet Take 1 tablet (20 mEq) by mouth daily. 12/23/22   Marcine Matar, MD  promethazine (PHENERGAN) 25 MG tablet  Take 1 tablet (25 mg total) by mouth every 8 (eight) hours as needed for nausea or vomiting. 03/06/23   Mayers, Cari S, PA-C  propranolol (INDERAL) 10 MG tablet Take 1 tablet by mouth 2 (two) times daily. Patient not taking: Reported on 03/06/2023 12/06/21   Marcine Matar, MD  senna-docusate (SENOKOT-S) 8.6-50 MG tablet Take 2 tablets by mouth at bedtime. For AFTER surgery only, do not take if having diarrhea Patient taking differently: Take 2 tablets by mouth at bedtime as needed for moderate constipation. For AFTER surgery only, do not take if having diarrhea 02/19/21   Warner Mccreedy D, NP  spironolactone (ALDACTONE) 25 MG tablet Take 0.5 tablets (12.5 mg total) by mouth daily. 03/02/23   Marcine Matar, MD  Ubrogepant (UBRELVY) 100 MG TABS Take 1 tablet (100 mg total) by mouth every 2 (two) hours as needed. Maximum 200mg  a day. Patient taking differently: Take 100 mg by mouth every 2 (two) hours as needed (migraine). Maximum 200mg  a day. 10/03/22   Anson Fret, MD  prochlorperazine (COMPAZINE) 10 MG tablet TAKE 1 TABLET BY MOUTH EVERY 6 HOURS AS NEEDED FOR NAUSEA OR VOMITING Patient taking differently: Take 10 mg by mouth every 6 (six) hours as needed for nausea or vomiting. 06/16/20 08/18/20  Serena Croissant, MD    Physical Exam: Vitals:   04/10/23 0945 04/10/23 1000 04/10/23 1030 04/10/23 1045  BP: (!) 145/96 (!) 154/95 (!) 155/93 (!) 160/95  Pulse: 96 98 (!) 101 92  Resp: 18 14 14 16   Temp:      TempSrc:      SpO2: 100% 100% 100% 98%  Weight:      Height:       Physical Exam Vitals and nursing note reviewed.  Constitutional:      General: She is awake. She is not in acute distress.    Appearance: Normal appearance. She is obese.  HENT:     Head: Normocephalic.     Nose: No rhinorrhea.     Mouth/Throat:      Mouth: Mucous membranes are moist.  Eyes:     General: No scleral icterus.    Pupils: Pupils are equal, round, and reactive to light.  Neck:     Vascular: No JVD.  Cardiovascular:     Rate and Rhythm: Normal rate and regular rhythm.     Heart sounds: S1 normal and S2 normal.  Pulmonary:     Effort: Pulmonary effort is normal.     Breath sounds: Normal breath sounds. No wheezing, rhonchi or rales.  Abdominal:     General: Bowel sounds are normal.     Palpations: Abdomen is soft.     Tenderness: There is no abdominal tenderness. There is no right CVA tenderness or left CVA tenderness.  Musculoskeletal:     Cervical back: Neck supple.     Right lower leg: No edema.     Left lower leg: No edema.  Skin:    General: Skin is warm and dry.  Neurological:     General: No focal deficit present.     Mental Status: She is alert and oriented to person, place, and time.  Psychiatric:        Mood and Affect: Mood normal.        Behavior: Behavior normal. Behavior is cooperative.     Data Reviewed:  Results are pending, will review when available.  Assessment and Plan: Principal Problem:   Hypokalemia Associated with:   Hypomagnesemia Not  on diuretics. Not drinking alcohol. Observation/telemetry. Continue IV fluids. Antiemetics as needed. Follow potassium and magnesium in a.m.  Active Problems:   Prolonged QT interval Avoid QT prolonging meds as possible. Supplementing potassium. Magnesium sulfate 4 g IVPB x 1 given. Keep electrolytes optimized. Check EKG in the morning.    Dizziness  CT was negative. Will correct electrolyte abnormalities and reevaluate. Fall precautions due to dizziness.    GERD (gastroesophageal reflux disease) Continue home omeprazole or formulary equivalent.    Essential hypertension Continue propranolol 10 mg p.o. twice daily. Continue amlodipine 5 mg p.o. twice daily. Continue lisinopril 10 mg p.o. twice daily. Continue spironolactone  12.5 mg p.o. daily. Monitor BP, HR, renal function electrolytes.    Hyperlipidemia Currently not on medical therapy. Follow-up with PCP.    Prediabetes Carbohydrate modified diet. CBG monitoring 4 times daily. If needed, add on RI SS. Check hemoglobin A1c.    Bipolar 1 disorder (HCC) Continue fluoxetine 30 mg p.o. daily. Continue olanzapine 15 mg p.o. at bedtime. Follow-up with PCP and/or behavioral health as OP.    Obesity (BMI 30.0-34.9) Current BMI 31.61 kg/m. Lifestyle modifications. Follow-up with PCP.    Hyperthyroidism Continue methimazole 5 mg p.o. daily.    Chronic migraine without aura without status migrainosus, not intractable No recent headaches. Continue ubrogepant tablets as needed.    Seizure (HCC) Continue Keppra 750 mg p.o. twice daily. Lorazepam 1 mg IVP every 4 hours as needed for seizures.       Advance Care Planning:   Code Status: Full Code   Consults:   Family Communication:   Severity of Illness: The appropriate patient status for this patient is OBSERVATION. Observation status is judged to be reasonable and necessary in order to provide the required intensity of service to ensure the patient's safety. The patient's presenting symptoms, physical exam findings, and initial radiographic and laboratory data in the context of their medical condition is felt to place them at decreased risk for further clinical deterioration. Furthermore, it is anticipated that the patient will be medically stable for discharge from the hospital within 2 midnights of admission.   Author: Bobette Mo, MD 04/10/2023 11:20 AM  For on call review [email or web address].   This document was prepared using Dragon voice recognition software and may contain some unintended transcription errors.

## 2023-04-11 ENCOUNTER — Observation Stay (HOSPITAL_COMMUNITY): Payer: PRIVATE HEALTH INSURANCE

## 2023-04-11 DIAGNOSIS — F17210 Nicotine dependence, cigarettes, uncomplicated: Secondary | ICD-10-CM | POA: Diagnosis present

## 2023-04-11 DIAGNOSIS — K219 Gastro-esophageal reflux disease without esophagitis: Secondary | ICD-10-CM | POA: Diagnosis present

## 2023-04-11 DIAGNOSIS — E785 Hyperlipidemia, unspecified: Secondary | ICD-10-CM | POA: Diagnosis present

## 2023-04-11 DIAGNOSIS — M1990 Unspecified osteoarthritis, unspecified site: Secondary | ICD-10-CM | POA: Diagnosis present

## 2023-04-11 DIAGNOSIS — G2581 Restless legs syndrome: Secondary | ICD-10-CM | POA: Diagnosis present

## 2023-04-11 DIAGNOSIS — R7303 Prediabetes: Secondary | ICD-10-CM | POA: Diagnosis present

## 2023-04-11 DIAGNOSIS — Z79899 Other long term (current) drug therapy: Secondary | ICD-10-CM | POA: Diagnosis not present

## 2023-04-11 DIAGNOSIS — R569 Unspecified convulsions: Secondary | ICD-10-CM | POA: Diagnosis present

## 2023-04-11 DIAGNOSIS — Z6831 Body mass index (BMI) 31.0-31.9, adult: Secondary | ICD-10-CM | POA: Diagnosis not present

## 2023-04-11 DIAGNOSIS — E669 Obesity, unspecified: Secondary | ICD-10-CM | POA: Diagnosis present

## 2023-04-11 DIAGNOSIS — I10 Essential (primary) hypertension: Secondary | ICD-10-CM | POA: Diagnosis present

## 2023-04-11 DIAGNOSIS — R42 Dizziness and giddiness: Secondary | ICD-10-CM | POA: Diagnosis not present

## 2023-04-11 DIAGNOSIS — F4310 Post-traumatic stress disorder, unspecified: Secondary | ICD-10-CM | POA: Diagnosis present

## 2023-04-11 DIAGNOSIS — E0590 Thyrotoxicosis, unspecified without thyrotoxic crisis or storm: Secondary | ICD-10-CM | POA: Diagnosis present

## 2023-04-11 DIAGNOSIS — F429 Obsessive-compulsive disorder, unspecified: Secondary | ICD-10-CM | POA: Diagnosis present

## 2023-04-11 DIAGNOSIS — E876 Hypokalemia: Secondary | ICD-10-CM | POA: Diagnosis present

## 2023-04-11 DIAGNOSIS — Z79811 Long term (current) use of aromatase inhibitors: Secondary | ICD-10-CM | POA: Diagnosis not present

## 2023-04-11 DIAGNOSIS — R9431 Abnormal electrocardiogram [ECG] [EKG]: Secondary | ICD-10-CM | POA: Diagnosis present

## 2023-04-11 DIAGNOSIS — F1021 Alcohol dependence, in remission: Secondary | ICD-10-CM | POA: Diagnosis present

## 2023-04-11 DIAGNOSIS — G43709 Chronic migraine without aura, not intractable, without status migrainosus: Secondary | ICD-10-CM | POA: Diagnosis present

## 2023-04-11 DIAGNOSIS — Z8249 Family history of ischemic heart disease and other diseases of the circulatory system: Secondary | ICD-10-CM | POA: Diagnosis not present

## 2023-04-11 DIAGNOSIS — F319 Bipolar disorder, unspecified: Secondary | ICD-10-CM | POA: Diagnosis present

## 2023-04-11 DIAGNOSIS — F411 Generalized anxiety disorder: Secondary | ICD-10-CM | POA: Diagnosis present

## 2023-04-11 DIAGNOSIS — Z801 Family history of malignant neoplasm of trachea, bronchus and lung: Secondary | ICD-10-CM | POA: Diagnosis not present

## 2023-04-11 LAB — COMPREHENSIVE METABOLIC PANEL
ALT: 43 U/L (ref 0–44)
AST: 37 U/L (ref 15–41)
Albumin: 3.6 g/dL (ref 3.5–5.0)
Alkaline Phosphatase: 50 U/L (ref 38–126)
Anion gap: 11 (ref 5–15)
BUN: <5 mg/dL — ABNORMAL LOW (ref 6–20)
CO2: 23 mmol/L (ref 22–32)
Calcium: 9.6 mg/dL (ref 8.9–10.3)
Chloride: 104 mmol/L (ref 98–111)
Creatinine, Ser: 0.62 mg/dL (ref 0.44–1.00)
GFR, Estimated: >60 mL/min (ref >60)
Glucose, Bld: 109 mg/dL — ABNORMAL HIGH (ref 70–99)
Potassium: 3.7 mmol/L (ref 3.5–5.1)
Sodium: 138 mmol/L (ref 135–145)
Total Bilirubin: 0.9 mg/dL (ref 0.3–1.2)
Total Protein: 7.2 g/dL (ref 6.5–8.1)

## 2023-04-11 LAB — MAGNESIUM: Magnesium: 1 mg/dL — ABNORMAL LOW (ref 1.7–2.4)

## 2023-04-11 LAB — URINALYSIS, W/ REFLEX TO CULTURE (INFECTION SUSPECTED)
Bacteria, UA: NONE SEEN
Bilirubin Urine: NEGATIVE
Glucose, UA: NEGATIVE mg/dL
Hgb urine dipstick: NEGATIVE
Ketones, ur: NEGATIVE mg/dL
Leukocytes,Ua: NEGATIVE
Nitrite: NEGATIVE
Protein, ur: NEGATIVE mg/dL
Specific Gravity, Urine: 1.015 (ref 1.005–1.030)
pH: 7 (ref 5.0–8.0)

## 2023-04-11 LAB — CBC
HCT: 39.9 % (ref 36.0–46.0)
Hemoglobin: 13 g/dL (ref 12.0–15.0)
MCH: 29 pg (ref 26.0–34.0)
MCHC: 32.6 g/dL (ref 30.0–36.0)
MCV: 89.1 fL (ref 80.0–100.0)
Platelets: 381 10*3/uL (ref 150–400)
RBC: 4.48 MIL/uL (ref 3.87–5.11)
RDW: 13.7 % (ref 11.5–15.5)
WBC: 8.9 10*3/uL (ref 4.0–10.5)
nRBC: 0 % (ref 0.0–0.2)

## 2023-04-11 LAB — TSH: TSH: 1.083 u[IU]/mL (ref 0.350–4.500)

## 2023-04-11 LAB — PROCALCITONIN: Procalcitonin: <0.1 ng/mL

## 2023-04-11 LAB — GLUCOSE, CAPILLARY
Glucose-Capillary: 148 mg/dL — ABNORMAL HIGH (ref 70–99)
Glucose-Capillary: 91 mg/dL (ref 70–99)
Glucose-Capillary: 80 mg/dL (ref 70–99)
Glucose-Capillary: 105 mg/dL — ABNORMAL HIGH (ref 70–99)

## 2023-04-11 LAB — C-REACTIVE PROTEIN: CRP: 0.6 mg/dL (ref <1.0)

## 2023-04-11 LAB — T4, FREE: Free T4: 0.92 ng/dL (ref 0.61–1.12)

## 2023-04-11 MED ORDER — ENOXAPARIN SODIUM 40 MG/0.4ML IJ SOSY
40.0000 mg | PREFILLED_SYRINGE | Freq: Every day | INTRAMUSCULAR | Status: DC
  Administered 2023-04-11 – 2023-04-12 (×2): 40 mg via SUBCUTANEOUS
  Filled 2023-04-11 (×2): qty 0.4

## 2023-04-11 MED ORDER — MAGNESIUM SULFATE 50 % IJ SOLN
6.0000 g | Freq: Once | INTRAVENOUS | Status: AC
  Administered 2023-04-11: 6 g via INTRAVENOUS
  Filled 2023-04-11: qty 12

## 2023-04-11 MED ORDER — IOHEXOL 350 MG/ML SOLN
75.0000 mL | Freq: Once | INTRAVENOUS | Status: AC | PRN
  Administered 2023-04-11: 75 mL via INTRAVENOUS

## 2023-04-11 MED ORDER — POTASSIUM CHLORIDE CRYS ER 20 MEQ PO TBCR
20.0000 meq | EXTENDED_RELEASE_TABLET | Freq: Once | ORAL | Status: AC
  Administered 2023-04-11: 20 meq via ORAL
  Filled 2023-04-11: qty 1

## 2023-04-11 NOTE — Evaluation (Signed)
Physical Therapy Evaluation Patient Details Name: Sandra Miller MRN: [id number] DOB: 1976/03/22 Today's Date: 04/11/2023  History of Present Illness  47 y.o. female  who was recently discharged due to seizures 5 days ago and 04/10/23 presents due to dizziness associated with nausea, emesis and unsteady gait.  Significant PMH: breast and vulvar CA, HTN, IDA, Graves disease, anxiety/bipolar, OA, PTSD, panic attacks,  Clinical Impression   Pt admitted secondary to problem above with deficits below. PTA patient was recently diagnosed with seizures and had been found on the floor, unconscious. She has significant neck pain since fall and believes she hit her head. She was still unsteady and requiring use of a RW (which she did not use prior to seizure--she worked as a Lawyer at [employer]).  Pt currently requires minguard assist with ambulation with RW due to reports of lightheadedness and slightly unsteady without UE support. Orthostatic BPs were negative, although she did have a drop in SBP. Patient could benefit from continued PT on discharge to progress independence, including return to not needing an assistive device.  Anticipate patient will benefit from PT to address problems listed below.Will continue to follow acutely to maximize functional mobility independence and safety.           If plan is discharge home, recommend the following: A little help with walking and/or transfers;A little help with bathing/dressing/bathroom;Assistance with cooking/housework;Direct supervision/assist for medications management;Direct supervision/assist for financial management;Assist for transportation;Help with stairs or ramp for entrance   Can travel by private vehicle        Equipment Recommendations None recommended by PT  Recommendations for Other Services  Speech consult;OT consult    Functional Status Assessment Patient has had a recent decline in their functional status and demonstrates the ability to  make significant improvements in function in a reasonable and predictable amount of time.     Precautions / Restrictions Precautions Precautions: Fall      Mobility  Bed Mobility               General bed mobility comments: up in chair    Transfers Overall transfer level: Modified independent Equipment used: Rolling walker (2 wheels) Transfers: Sit to/from Stand Sit to Stand: Modified independent (Device/Increase time)           General transfer comment: pt with proper sequencing with RW; no imbalance noted    Ambulation/Gait Ambulation/Gait assistance: Min guard Gait Distance (Feet): 170 Feet Assistive device: Rolling walker (2 wheels), None Gait Pattern/deviations: Step-through pattern, Decreased stride length, Trunk flexed   Gait velocity interpretation: 1.31 - 2.62 ft/sec, indicative of limited community ambulator   General Gait Details: vc for proximity to RW and upright posture  Stairs            Wheelchair Mobility     Tilt Bed    Modified Rankin (Stroke Patients Only)       Balance Overall balance assessment: Mild deficits observed, not formally tested                                           Pertinent Vitals/Pain Pain Assessment Pain Assessment: Faces Faces Pain Scale: Hurts little more Pain Location: neck Pain Descriptors / Indicators: Aching Pain Intervention(s): Limited activity within patient's tolerance    Home Living Family/patient expects to be discharged to:: Private residence Living Arrangements: Parent (mother) Available Help at Discharge: Family Type  of Home: Apartment Home Access: Level entry       Home Layout: One level Home Equipment: Agricultural consultant (2 wheels);BSC/3in1      Prior Function Prior Level of Function : Independent/Modified Independent             Mobility Comments: has been using RW since discharge from hospital last week ADLs Comments: daughter has been helping her  shower since last admission (seizure precautions)     Hand Dominance        Extremity/Trunk Assessment   Upper Extremity Assessment Upper Extremity Assessment: Overall WFL for tasks assessed    Lower Extremity Assessment Lower Extremity Assessment: Overall WFL for tasks assessed    Cervical / Trunk Assessment Cervical / Trunk Assessment: Normal  Communication   Communication: No difficulties  Cognition Arousal/Alertness: Awake/alert Behavior During Therapy: Flat affect Overall Cognitive Status: Within Functional Limits for tasks assessed                                 General Comments: pt a&ox4 and reports she has been having memory issues since her seizure (with fall); word-finding; lightheadedness; nausea and vomiting and thinks she may have a concussion        General Comments General comments (skin integrity, edema, etc.): Orthostatics done prior to PT session and were technically negative, but did experience a drop in SBP    Exercises     Assessment/Plan    PT Assessment Patient needs continued PT services  PT Problem List Decreased strength;Decreased activity tolerance;Decreased balance;Decreased mobility;Decreased cognition;Decreased safety awareness;Decreased knowledge of use of DME       PT Treatment Interventions Gait training;Functional mobility training;Therapeutic activities;Therapeutic exercise;Balance training;Patient/family education;DME instruction    PT Goals (Current goals can be found in the Care Plan section)  Acute Rehab PT Goals Patient Stated Goal: find out what is wrong with her PT Goal Formulation: With patient Time For Goal Achievement: 04/25/23 Potential to Achieve Goals: Good    Frequency Min 1X/week     Co-evaluation               AM-PAC PT "6 Clicks" Mobility  Outcome Measure Help needed turning from your back to your side while in a flat bed without using bedrails?: None Help needed moving from  lying on your back to sitting on the side of a flat bed without using bedrails?: None Help needed moving to and from a bed to a chair (including a wheelchair)?: None Help needed standing up from a chair using your arms (e.g., wheelchair or bedside chair)?: None Help needed to walk in hospital room?: A Little Help needed climbing 3-5 steps with a railing? : A Little 6 Click Score: 22    End of Session Equipment Utilized During Treatment: Gait belt Activity Tolerance: Patient tolerated treatment well Patient left: in chair;with call bell/phone within reach Nurse Communication: Mobility status;Other (comment) (pt asked for purewick to be removed; safe to walk to BR with staff and RW) PT Visit Diagnosis: Unsteadiness on feet (R26.81);Other abnormalities of gait and mobility (R26.89)    Time: 4401-0272 PT Time Calculation (min) (ACUTE ONLY): 24 min   Charges:   PT Evaluation $PT Eval Low Complexity: 1 Low PT Treatments $Gait Training: 8-22 mins PT General Charges $$ ACUTE PT VISIT: 1 Visit          Jerolyn Center, PT Acute Rehabilitation Services  Office [phone]'  Zena Amos 04/11/2023,  10:46 AM

## 2023-04-11 NOTE — Progress Notes (Signed)
PROGRESS NOTE                                                                                                                                                                                                             Patient Demographics:    Sandra Miller, is a 47 y.o. female, DOB - [birth year], XBJ:[id number]  Outpatient Primary MD for the patient is Marcine Matar, MD    LOS - 0  Admit date - 04/10/2023    Chief Complaint  Patient presents with   Dizziness       Brief Narrative (HPI from H&P)   47 y.o. female with medical history significant of osteoarthritis, bipolar 1 disorder, cancer of vulva breast, history of radiation therapy, depression, generalized anxiety disorder, panic attacks, PTSD, genital HSV, GERD, hiatal hernia, gastric ulcer, history of esophageal dilatation, history of H. pylori infection, hidradenitis suppurativa, nephrolithiasis, Graves' disease, hypertension, iron deficiency anemia, prediabetes, restless leg syndrome, vaginal  infection who was recently discharged due to seizures 5 days ago and now is brought with complaints of suprapubic pressure, urinary incontinence, generalized weakness and dizziness.   Subjective:    [name] today has, No headache, No chest pain, No abdominal pain - No Nausea, No new weakness tingling or numbness, no SOB, has urinary incontinence, some suprapubic pressure   Assessment  & Plan :   Dizziness - CT head negative, no headache or focal deficits, recent MRI noted which was unremarkable, she does have some suprapubic pain and pressure along with urinary incontinence, check UA with urine culture, check CT abdomen pelvis with IV contrast, currently not orthostatic, continue gentle hydration, PT OT.   Prolonged QT interval - likely due to severe electrolyte abnormality, electrolytes replaced repeat EKG in a.m.  Profound hypomagnesemia and hypokalemia.  Replaced.     Suprapubic pain and pressure.  History of labial malignancy with radiation to pelvic area, urinary incontinence ongoing for several weeks to months, likely related to radiation, rule out UTI, CT abdomen pelvis as above.  Outpatient follow-up with urology.    GERD (gastroesophageal reflux disease) - Continue home omeprazole or formulary equivalent.   Essential hypertension - Continue propranolol, Norvasc, ACE inhibitor, Aldactone combination and monitor.   Hyperthyroidism - Continue methimazole 5 mg p.o. daily.  TSH and free T4 stable.   Chronic migraine without aura without status  migrainosus, not intractable - No recent headaches. Continue ubrogepant tablets as needed.   Recent history of questionable seizure versus syncope.  Was admitted few days ago, seen by neurology, EEG inconclusive, MRI brain nonacute, was placed on Keppra which will be continued at home dose. Continue Keppra 750 mg p.o. twice daily.  Hyperlipidemia - Currently not on medical therapy. Follow-up with PCP.   Bipolar 1 disorder (HCC) - Continue fluoxetine 30 mg p.o. daily. Continue olanzapine 15 mg p.o. at bedtime. Follow-up with PCP and/or behavioral health as OP.   Obesity (BMI 30.0-34.9) - Current BMI 31.61 kg/m.  Follow-up with PCP for weight loss.   Prediabetes - Carbohydrate modified diet. CBG monitoring 4 times daily.  Lab Results  Component Value Date   HGBA1C 5.3 04/10/2023   CBG (last 3)  Recent Labs    04/10/23 1659 04/11/23 0822  GLUCAP 126* 148*         Condition - Fair  Family Communication  :  None  Code Status :  Full  Consults  :  None  PUD Prophylaxis : PPI   Procedures  :     CT abdomen pelvis.    CT head.  Nonacute.      Disposition Plan  :    Status is: Observation   DVT Prophylaxis  :    enoxaparin (LOVENOX) injection 40 mg Start: 04/11/23 1130 SCDs Start: 04/10/23 1221  Lab Results  Component Value Date   PLT 381 04/11/2023    Diet :  Diet Order              Diet heart healthy/carb modified Room service appropriate? Yes; Fluid consistency: Thin  Diet effective now                    Inpatient Medications  Scheduled Meds:  amLODipine  5 mg Oral Daily   enoxaparin (LOVENOX) injection  40 mg Subcutaneous Daily   FLUoxetine  30 mg Oral Daily   letrozole  2.5 mg Oral Daily   levETIRAcetam  750 mg Oral BID   lisinopril  10 mg Oral Daily   methimazole  5 mg Oral Daily   pantoprazole  40 mg Oral Daily   potassium chloride  20 mEq Oral Once   propranolol  10 mg Oral BID   spironolactone  12.5 mg Oral Daily   Continuous Infusions:  magnesium sulfate bolus IVPB     PRN Meds:.acetaminophen **OR** acetaminophen, LORazepam, prochlorperazine, senna-docusate, Ubrogepant  Antibiotics  :    Anti-infectives (From admission, onward)    None         Objective:   Vitals:   04/11/23 0800 04/11/23 0818 04/11/23 0819 04/11/23 0824  BP: (!) 159/81 (!) 152/93 (!) 152/93 (!) 142/108  Pulse: 82  85 (!) 102  Resp: 15 17 19  (!) 23  Temp:    98.1 F (36.7 C)  TempSrc:    Oral  SpO2: 99%   99%  Weight:      Height:        Wt Readings from Last 3 Encounters:  04/10/23 87.5 kg  04/04/23 87.9 kg  03/06/23 86.2 kg     Intake/Output Summary (Last 24 hours) at 04/11/2023 1101 Last data filed at 04/11/2023 0500 Gross per 24 hour  Intake --  Output 1600 ml  Net -1600 ml     Physical Exam  Awake Alert, No new F.N deficits, Normal affect [place].AT,PERRAL Supple Neck, No JVD,   Symmetrical Chest wall movement, Good  air movement bilaterally, CTAB RRR,No Gallops,Rubs or new Murmurs,  +ve B.Sounds, Abd Soft, No tenderness,   No Cyanosis, Clubbing or edema       Data Review:    Recent Labs  Lab 04/10/23 0940 04/11/23 0715  WBC 13.9* 8.9  HGB 12.1 13.0  HCT 37.4 39.9  PLT 352 381  MCV 89.9 89.1  MCH 29.1 29.0  MCHC 32.4 32.6  RDW 13.7 13.7  LYMPHSABS 2.4  --   MONOABS 0.7  --   EOSABS 0.1  --   BASOSABS 0.0  --      Recent Labs  Lab 04/04/23 2335 04/10/23 0940 04/10/23 1439 04/11/23 0715 04/11/23 0722  NA 141 140 142 138  --   K 3.3* 2.8* 3.6 3.7  --   CL 107 103 104 104  --   CO2 20* 25 25 23   --   ANIONGAP 14 12 13 11   --   GLUCOSE 100* 123* 100* 109*  --   BUN <5* 5* <5* <5*  --   CREATININE 0.57 0.77 0.59 0.62  --   AST  --  38  --  37  --   ALT  --  49*  --  43  --   ALKPHOS  --  39  --  50  --   BILITOT  --  1.0  --  0.9  --   ALBUMIN  --  3.6  --  3.6  --   TSH  --   --   --   --  1.083  HGBA1C  --  5.3  --   --   --   MG  --  <0.5*  --  1.0*  --   CALCIUM 9.2 9.9 9.4 9.6  --       Recent Labs  Lab 04/04/23 2335 04/10/23 0940 04/10/23 1439 04/11/23 0715 04/11/23 0722  TSH  --   --   --   --  1.083  HGBA1C  --  5.3  --   --   --   MG  --  <0.5*  --  1.0*  --   CALCIUM 9.2 9.9 9.4 9.6  --       Radiology Reports CT Head Wo Contrast  Result Date: 04/10/2023 CLINICAL DATA:  Neuro deficit, acute, stroke suspected. EXAM: CT HEAD WITHOUT CONTRAST TECHNIQUE: Contiguous axial images were obtained from the base of the skull through the vertex without intravenous contrast. RADIATION DOSE REDUCTION: This exam was performed according to the departmental dose-optimization program which includes automated exposure control, adjustment of the mA and/or kV according to patient size and/or use of iterative reconstruction technique. COMPARISON:  Head CT 04/01/2023. FINDINGS: Brain: No acute intracranial hemorrhage. Gray-white differentiation is preserved. No hydrocephalus or extra-axial collection. No mass effect or midline shift. Unchanged partially empty sella. Vascular: No hyperdense vessel or unexpected calcification. Skull: No calvarial fracture or suspicious bone lesion. Skull base is unremarkable. Sinuses/Orbits: No acute finding. Other: None. IMPRESSION: No acute intracranial abnormality. Electronically Signed   By: Orvan Falconer M.D.   On: 04/10/2023 10:52      Signature  -    Susa Raring M.D on 04/11/2023 at 11:01 AM   -  To page go to [email or web address]

## 2023-04-11 NOTE — Plan of Care (Signed)
  Problem: Activity: Goal: Ability to tolerate increased activity will improve Outcome: Progressing   Problem: Respiratory: Goal: Ability to maintain a clear airway and adequate ventilation will improve Outcome: Progressing   Problem: Role Relationship: Goal: Method of communication will improve Outcome: Progressing   Problem: Education: Goal: Knowledge of General Education information will improve Description: Including pain rating scale, medication(s)/side effects and non-pharmacologic comfort measures Outcome: Progressing   Problem: Health Behavior/Discharge Planning: Goal: Ability to manage health-related needs will improve Outcome: Progressing   Problem: Clinical Measurements: Goal: Ability to maintain clinical measurements within normal limits will improve Outcome: Progressing Goal: Will remain free from infection Outcome: Progressing Goal: Diagnostic test results will improve Outcome: Progressing Goal: Respiratory complications will improve Outcome: Progressing Goal: Cardiovascular complication will be avoided Outcome: Progressing   Problem: Activity: Goal: Risk for activity intolerance will decrease Outcome: Progressing   Problem: Nutrition: Goal: Adequate nutrition will be maintained Outcome: Progressing   Problem: Coping: Goal: Level of anxiety will decrease Outcome: Progressing   Problem: Elimination: Goal: Will not experience complications related to bowel motility Outcome: Progressing Goal: Will not experience complications related to urinary retention Outcome: Progressing   Problem: Pain Managment: Goal: General experience of comfort will improve Outcome: Progressing   Problem: Safety: Goal: Ability to remain free from injury will improve Outcome: Progressing   Problem: Skin Integrity: Goal: Risk for impaired skin integrity will decrease Outcome: Progressing   Problem: Education: Goal: Ability to describe self-care measures that may  prevent or decrease complications (Diabetes Survival Skills Education) will improve Outcome: Progressing Goal: Individualized Educational Video(s) Outcome: Progressing   Problem: Coping: Goal: Ability to adjust to condition or change in health will improve Outcome: Progressing   Problem: Fluid Volume: Goal: Ability to maintain a balanced intake and output will improve Outcome: Progressing   Problem: Health Behavior/Discharge Planning: Goal: Ability to identify and utilize available resources and services will improve Outcome: Progressing Goal: Ability to manage health-related needs will improve Outcome: Progressing   Problem: Metabolic: Goal: Ability to maintain appropriate glucose levels will improve Outcome: Progressing   Problem: Nutritional: Goal: Maintenance of adequate nutrition will improve Outcome: Progressing Goal: Progress toward achieving an optimal weight will improve Outcome: Progressing   Problem: Skin Integrity: Goal: Risk for impaired skin integrity will decrease Outcome: Progressing   Problem: Tissue Perfusion: Goal: Adequacy of tissue perfusion will improve Outcome: Progressing   

## 2023-04-12 ENCOUNTER — Inpatient Hospital Stay (HOSPITAL_COMMUNITY): Payer: PRIVATE HEALTH INSURANCE

## 2023-04-12 DIAGNOSIS — R9431 Abnormal electrocardiogram [ECG] [EKG]: Secondary | ICD-10-CM | POA: Diagnosis not present

## 2023-04-12 DIAGNOSIS — E669 Obesity, unspecified: Secondary | ICD-10-CM | POA: Diagnosis not present

## 2023-04-12 DIAGNOSIS — F319 Bipolar disorder, unspecified: Secondary | ICD-10-CM

## 2023-04-12 DIAGNOSIS — R42 Dizziness and giddiness: Secondary | ICD-10-CM | POA: Diagnosis not present

## 2023-04-12 DIAGNOSIS — I10 Essential (primary) hypertension: Secondary | ICD-10-CM

## 2023-04-12 LAB — TROPONIN I (HIGH SENSITIVITY): Troponin I (High Sensitivity): 12 ng/L (ref <18)

## 2023-04-12 LAB — ECHOCARDIOGRAM COMPLETE
AR max vel: 3.48 cm2
AV Peak grad: 5.7 mmHg
Ao pk vel: 1.19 m/s
Area-P 1/2: 3.85 cm2
Height: 65.5 in
MV M vel: 1.15 m/s
MV Peak grad: 5.3 mmHg
S' Lateral: 3.6 cm
Weight: 3086.44 oz

## 2023-04-12 LAB — GLUCOSE, CAPILLARY
Glucose-Capillary: 107 mg/dL — ABNORMAL HIGH (ref 70–99)
Glucose-Capillary: 86 mg/dL (ref 70–99)
Glucose-Capillary: 125 mg/dL — ABNORMAL HIGH (ref 70–99)

## 2023-04-12 MED ORDER — LORAZEPAM 1 MG PO TABS
1.0000 mg | ORAL_TABLET | Freq: Three times a day (TID) | ORAL | Status: DC | PRN
  Administered 2023-04-12: 1 mg via ORAL
  Filled 2023-04-12: qty 1

## 2023-04-12 MED ORDER — POTASSIUM CHLORIDE CRYS ER 20 MEQ PO TBCR
40.0000 meq | EXTENDED_RELEASE_TABLET | Freq: Once | ORAL | Status: AC
  Administered 2023-04-12: 40 meq via ORAL
  Filled 2023-04-12: qty 2

## 2023-04-12 NOTE — Plan of Care (Signed)
Patient is discharging home today. Her daughter helps with her care.

## 2023-04-12 NOTE — Progress Notes (Signed)
  Echocardiogram 2D Echocardiogram has been performed.  Sandra Miller 04/12/2023, 4:03 PM

## 2023-04-12 NOTE — Discharge Instructions (Signed)
Follow with Primary MD Ladell Pier, MD in 7 days   Get CBC, CMP,  checked  by Primary MD next visit.    Activity: As tolerated with Full fall precautions use walker/cane & assistance as needed   Disposition Home    Diet: Heart Healthy   On your next visit with your primary care physician please Get Medicines reviewed and adjusted.   Please request your Prim.MD to go over all Hospital Tests and Procedure/Radiological results at the follow up, please get all Hospital records sent to your Prim MD by signing hospital release before you go home.   If you experience worsening of your admission symptoms, develop shortness of breath, life threatening emergency, suicidal or homicidal thoughts you must seek medical attention immediately by calling 911 or calling your MD immediately  if symptoms less severe.  You Must read complete instructions/literature along with all the possible adverse reactions/side effects for all the Medicines you take and that have been prescribed to you. Take any new Medicines after you have completely understood and accpet all the possible adverse reactions/side effects.   Do not drive, operating heavy machinery, perform activities at heights, swimming or participation in water activities or provide baby sitting services if your were admitted for syncope or siezures until you have seen by Primary MD or a Neurologist and advised to do so again.  Do not drive when taking Pain medications.    Do not take more than prescribed Pain, Sleep and Anxiety Medications  Special Instructions: If you have smoked or chewed Tobacco  in the last 2 yrs please stop smoking, stop any regular Alcohol  and or any Recreational drug use.  Wear Seat belts while driving.   Please note  You were cared for by a hospitalist during your hospital stay. If you have any questions about your discharge medications or the care you received while you were in the hospital after you are  discharged, you can call the unit and asked to speak with the hospitalist on call if the hospitalist that took care of you is not available. Once you are discharged, your primary care physician will handle any further medical issues. Please note that NO REFILLS for any discharge medications will be authorized once you are discharged, as it is imperative that you return to your primary care physician (or establish a relationship with a primary care physician if you do not have one) for your aftercare needs so that they can reassess your need for medications and monitor your lab values.

## 2023-04-12 NOTE — Discharge Summary (Signed)
Physician Discharge Summary  Sandra Miller:[id number] DOB: 01/07/1976 DOA: 04/10/2023  PCP: Marcine Matar, MD  Admit date: 04/10/2023 Discharge date: 04/12/2023  Admitted From: (Home) Disposition:  (Home)  Recommendations for Outpatient Follow-up:  Follow up with PCP in 1-2 weeks Please obtain BMP/CBC in one week Please follow up on the following pending results:  Discharge Condition: (Stable) Diet recommendation: Heart Healthy  Brief/Interim Summary:   47 y.o. female with medical history significant of osteoarthritis, bipolar 1 disorder, cancer of vulva breast, history of radiation therapy, depression, generalized anxiety disorder, panic attacks, PTSD, genital HSV, GERD, hiatal hernia, gastric ulcer, history of esophageal dilatation, history of H. pylori infection, hidradenitis suppurativa, nephrolithiasis, Graves' disease, hypertension, iron deficiency anemia, prediabetes, restless leg syndrome, vaginal  infection who was recently discharged due to seizures 5 days ago and now is brought with complaints of suprapubic pressure, urinary incontinence, generalized weakness and dizziness.   Dizziness  - CT head negative, no headache or focal deficits, recent MRI noted which was unremarkable, she did have some suprapubic pain, UA was obtained, it was negative, CT abdomen pelvis has been obtained as well, with no concerning findings aside from sigmoid colon wall thickening, related to previous radiation.  Was treated with gentle hydration, seen by PT OT, -She was not orthostatic. -Dizziness much improved, arrange for outpatient PT.   Prolonged QT interval  - likely due to severe electrolyte abnormality, electrolytes replaced, her QTc much improved down from 56 on admission to 511 on discharge, but still elevated so I have discontinued her Zyprexa.   Profound hypomagnesemia and hypokalemia.  Replaced.     Suprapubic pain and pressure.  History of labial malignancy with radiation to  pelvic area, urinary incontinence ongoing for several weeks to months, likely related to radiation, negative UA, , CT abdomen pelvis as above.  Outpatient follow-up with urology.     GERD (gastroesophageal reflux disease) - Continue home omeprazole    Essential hypertension - Continue propranolol, Norvasc, ACE inhibitor, Aldactone combination and monitor.   Hyperthyroidism - Continue methimazole 5 mg p.o. daily.  TSH and free T4 stable.   Chronic migraine without aura without status migrainosus, not intractable - No recent headaches. Continue ubrogepant tablets as needed.   Recent history of questionable seizure versus syncope.  Was admitted few days ago, seen by neurology, EEG inconclusive, MRI brain nonacute, was placed on Keppra which will be continued at home dose. Continue Keppra 750 mg p.o. twice daily.   Hyperlipidemia - Currently not on medical therapy. Follow-up with PCP.   Bipolar 1 disorder (HCC) - Continue fluoxetine 30 mg p.o. daily.  QTc is prolonged, so Zyprexa has been discontinued at time of discharge, she is to follow with her PCP/behavioral health regarding further recommendations.    Abnormal EKG -Her EKG was noted with progression of T wave inversion, she denies any chest pain or shortness of breath, troponins is negative, 2D echo has been obtained with a preserved EF and no regional wall motion abnormalities.   Obesity (BMI 30.0-34.9) - Current BMI 31.61 kg/m.  Follow-up with PCP for weight loss.   Prediabetes - Carbohydrate modified diet. CBG monitoring 4 times daily during hospital stay     Discharge Diagnoses:  Principal Problem:   Hypomagnesemia Active Problems:   GERD (gastroesophageal reflux disease)   Essential hypertension   Hypokalemia   Hyperlipidemia   Prediabetes   Bipolar 1 disorder (HCC)   Obesity (BMI 30.0-34.9)   Hyperthyroidism   Chronic migraine without aura without  status migrainosus, not intractable   Seizure (HCC)   Prolonged QT  interval   Dizziness    Discharge Instructions  Discharge Instructions     Diet - low sodium heart healthy   Complete by: As directed    Discharge instructions   Complete by: As directed    Follow with Primary MD Marcine Matar, MD in 7 days   Get CBC, CMP,  checked  by Primary MD next visit.    Activity: As tolerated with Full fall precautions use walker/cane & assistance as needed   Disposition Home   Diet: Heart Healthy   On your next visit with your primary care physician please Get Medicines reviewed and adjusted.   Please request your Prim.MD to go over all Hospital Tests and Procedure/Radiological results at the follow up, please get all Hospital records sent to your Prim MD by signing hospital release before you go home.   If you experience worsening of your admission symptoms, develop shortness of breath, life threatening emergency, suicidal or homicidal thoughts you must seek medical attention immediately by calling 911 or calling your MD immediately  if symptoms less severe.  You Must read complete instructions/literature along with all the possible adverse reactions/side effects for all the Medicines you take and that have been prescribed to you. Take any new Medicines after you have completely understood and accpet all the possible adverse reactions/side effects.   Do not drive, operating heavy machinery, perform activities at heights, swimming or participation in water activities or provide baby sitting services if your were admitted for syncope or siezures until you have seen by Primary MD or a Neurologist and advised to do so again.  Do not drive when taking Pain medications.    Do not take more than prescribed Pain, Sleep and Anxiety Medications  Special Instructions: If you have smoked or chewed Tobacco  in the last 2 yrs please stop smoking, stop any regular Alcohol  and or any Recreational drug use.  Wear Seat belts while driving.   Please  note  You were cared for by a hospitalist during your hospital stay. If you have any questions about your discharge medications or the care you received while you were in the hospital after you are discharged, you can call the unit and asked to speak with the hospitalist on call if the hospitalist that took care of you is not available. Once you are discharged, your primary care physician will handle any further medical issues. Please note that NO REFILLS for any discharge medications will be authorized once you are discharged, as it is imperative that you return to your primary care physician (or establish a relationship with a primary care physician if you do not have one) for your aftercare needs so that they can reassess your need for medications and monitor your lab values.   Increase activity slowly   Complete by: As directed       Allergies as of 04/12/2023       Reactions   Dilaudid [hydromorphone Hcl] Other (See Comments)   Pt became confused, pulled out iv's, does not remember anything   Aspirin Hives   States able to tolerate Goody Powders and Ibuprofen without any problem    Depakote [divalproex Sodium] Nausea And Vomiting   Minocycline Hives        Medication List     STOP taking these medications    OLANZapine 15 MG tablet Commonly known as: ZYPREXA  TAKE these medications    Ajovy 225 MG/1.5ML Soaj Generic drug: Fremanezumab-vfrm Inject 225 mg into the skin every 30 (thirty) days.   amLODipine 5 MG tablet Commonly known as: NORVASC Take 5 mg by mouth daily.   diclofenac Sodium 1 % Gel Commonly known as: Voltaren Apply 4 grams topically 4  times daily as needed. What changed: reasons to take this   FLUoxetine 10 MG capsule Commonly known as: PROZAC Take 3 capsules (30 mg total) by mouth daily. What changed: when to take this   gabapentin 300 MG capsule Commonly known as: NEURONTIN Take 1 capsule (300 mg total) by mouth daily. Pt needs office  visit.   letrozole 2.5 MG tablet Commonly known as: FEMARA Take 1 tablet by mouth daily. What changed: Another medication with the same name was removed. Continue taking this medication, and follow the directions you see here.   levETIRAcetam 750 MG tablet Commonly known as: KEPPRA Take 1 tablet (750 mg total) by mouth 2 (two) times daily.   lisinopril 10 MG tablet Commonly known as: ZESTRIL Take 1 tablet (10 mg total) by mouth daily. Stop the Lisinopril/HCTZ combination   LORazepam 1 MG tablet Commonly known as: ATIVAN Take 1 tablet (1 mg total) by mouth 3 (three) times daily as needed for anxiety What changed: reasons to take this   methimazole 5 MG tablet Commonly known as: TAPAZOLE Take one tablet (5 mg dose) by mouth daily. What changed:  how much to take how to take this when to take this   multivitamin with minerals Tabs tablet Take 1 tablet by mouth daily.   omeprazole 40 MG capsule Commonly known as: PRILOSEC Take 1 capsule (40 mg total) by mouth in the morning and at bedtime.   ondansetron 8 MG disintegrating tablet Commonly known as: ZOFRAN-ODT Dissolve 1 tablet (8 mg total) by mouth every 8 (eight) hours as needed for nausea or vomiting.   potassium chloride SA 20 MEQ tablet Commonly known as: KLOR-CON M Take 1 tablet (20 mEq) by mouth daily.   promethazine 25 MG tablet Commonly known as: PHENERGAN Take 1 tablet (25 mg total) by mouth every 8 (eight) hours as needed for nausea or vomiting.   propranolol 10 MG tablet Commonly known as: INDERAL Take 1 tablet by mouth 2 (two) times daily.   senna-docusate 8.6-50 MG tablet Commonly known as: Senokot-S Take 2 tablets by mouth at bedtime. For AFTER surgery only, do not take if having diarrhea What changed:  when to take this reasons to take this   spironolactone 25 MG tablet Commonly known as: Aldactone Take 0.5 tablets (12.5 mg total) by mouth daily.   Ubrelvy 100 MG Tabs Generic drug:  Ubrogepant Take 1 tablet (100 mg total) by mouth every 2 (two) hours as needed. Maximum 200mg  a day. What changed: reasons to take this        Allergies  Allergen Reactions   Dilaudid [Hydromorphone Hcl] Other (See Comments)    Pt became confused, pulled out iv's, does not remember anything   Aspirin Hives    States able to tolerate Goody Powders and Ibuprofen without any problem    Depakote [Divalproex Sodium] Nausea And Vomiting   Minocycline Hives    Consultations: none   Procedures/Studies: ECHOCARDIOGRAM COMPLETE  Result Date: 04/12/2023    ECHOCARDIOGRAM REPORT   Patient Name:   GURKIRAN NEWITT Lafontaine Date of Exam: 04/12/2023 Medical Rec #:  [id number]       Height:  65.5 in Accession #:    [id number]      Weight:       192.9 lb Date of Birth:  February 06, 1976       BSA:          1.959 m Patient Age:    47 years        BP:           137/93 mmHg Patient Gender: F               HR:           68 bpm. Exam Location:  Inpatient Procedure: 2D Echo, Cardiac Doppler, Color Doppler and Strain Analysis Indications:    Abnormal ECG R94.31  History:        Patient has prior history of Echocardiogram examinations, most                 recent 03/10/2021. Risk Factors:Hypertension, Dyslipidemia and                 Current Smoker. Migraine                 Breast Cancer.  Sonographer:    Lucendia Herrlich Referring Phys: [id number]  [id number]  IMPRESSIONS  1. Left ventricular ejection fraction, by estimation, is 55 to 60%. The left ventricle has normal function. The left ventricle has no regional wall motion abnormalities. Left ventricular diastolic parameters are consistent with Grade I diastolic dysfunction (impaired relaxation).  2. Right ventricular systolic function is normal. The right ventricular size is normal. There is normal pulmonary artery systolic pressure. The estimated right ventricular systolic pressure is 10.8 mmHg.  3. The mitral valve is normal in structure. No evidence of mitral valve  regurgitation. No evidence of mitral stenosis.  4. The aortic valve is tricuspid. Aortic valve regurgitation is not visualized. Aortic valve sclerosis/calcification is present, without any evidence of aortic stenosis. Aortic valve Vmax measures 1.19 m/s.  5. The inferior vena cava is normal in size with greater than 50% respiratory variability, suggesting right atrial pressure of 3 mmHg. FINDINGS  Left Ventricle: Left ventricular ejection fraction, by estimation, is 55 to 60%. The left ventricle has normal function. The left ventricle has no regional wall motion abnormalities. Global longitudinal strain performed but not reported based on interpreter judgement due to suboptimal tracking. The left ventricular internal cavity size was normal in size. There is no left ventricular hypertrophy. Left ventricular diastolic parameters are consistent with Grade I diastolic dysfunction (impaired relaxation). Normal left ventricular filling pressure. Right Ventricle: The right ventricular size is normal. No increase in right ventricular wall thickness. Right ventricular systolic function is normal. There is normal pulmonary artery systolic pressure. The tricuspid regurgitant velocity is 1.40 m/s, and  with an assumed right atrial pressure of 3 mmHg, the estimated right ventricular systolic pressure is 10.8 mmHg. Left Atrium: Left atrial size was normal in size. Right Atrium: Right atrial size was normal in size. Pericardium: Trivial pericardial effusion is present. The pericardial effusion is anterior to the right ventricle. Mitral Valve: The mitral valve is normal in structure. No evidence of mitral valve regurgitation. No evidence of mitral valve stenosis. Tricuspid Valve: The tricuspid valve is normal in structure. Tricuspid valve regurgitation is trivial. No evidence of tricuspid stenosis. Aortic Valve: The aortic valve is tricuspid. Aortic valve regurgitation is not visualized. Aortic valve sclerosis/calcification is  present, without any evidence of aortic stenosis. Aortic valve peak gradient measures 5.7 mmHg. Pulmonic Valve: The pulmonic valve  was normal in structure. Pulmonic valve regurgitation is not visualized. No evidence of pulmonic stenosis. Aorta: The aortic root is normal in size and structure. Venous: The inferior vena cava is normal in size with greater than 50% respiratory variability, suggesting right atrial pressure of 3 mmHg. IAS/Shunts: No atrial level shunt detected by color flow Doppler.  LEFT VENTRICLE PLAX 2D LVIDd:         5.30 cm   Diastology LVIDs:         3.60 cm   LV e' medial:    6.53 cm/s LV PW:         1.00 cm   LV E/e' medial:  8.8 LV IVS:        0.80 cm   LV e' lateral:   6.22 cm/s LVOT diam:     2.30 cm   LV E/e' lateral: 9.2 LV SV:         64 LV SV Index:   33 LVOT Area:     4.15 cm  RIGHT VENTRICLE             IVC RV S prime:     13.20 cm/s  IVC diam: 1.10 cm TAPSE (M-mode): 1.5 cm LEFT ATRIUM           Index        RIGHT ATRIUM           Index LA diam:      2.80 cm 1.43 cm/m   RA Area:     11.20 cm LA Vol (A2C): 24.1 ml 12.28 ml/m  RA Volume:   23.00 ml  11.74 ml/m LA Vol (A4C): 26.5 ml 13.53 ml/m  AORTIC VALVE AV Area (Vmax): 3.48 cm AV Vmax:        119.00 cm/s AV Peak Grad:   5.7 mmHg LVOT Vmax:      99.67 cm/s LVOT Vmean:     61.600 cm/s LVOT VTI:       0.155 m  AORTA Ao Root diam: 3.40 cm Ao Asc diam:  3.20 cm MITRAL VALVE               TRICUSPID VALVE MV Area (PHT): 3.85 cm    TR Peak grad:   7.8 mmHg MV Decel Time: 197 msec    TR Vmax:        140.00 cm/s MR Peak grad: 5.3 mmHg MR Vmax:      115.00 cm/s  SHUNTS MV E velocity: 57.20 cm/s  Systemic VTI:  0.16 m MV A velocity: 85.40 cm/s  Systemic Diam: 2.30 cm MV E/A ratio:  0.67 Armanda Magic MD Electronically signed by Armanda Magic MD Signature Date/Time: 04/12/2023/4:32:38 PM    Final    CT ABDOMEN PELVIS W CONTRAST  Result Date: 04/11/2023 CLINICAL DATA:  Acute abdominal pain. Suprapubic pressure with urinary incontinence and  generalized weakness. EXAM: CT ABDOMEN AND PELVIS WITH CONTRAST TECHNIQUE: Multidetector CT imaging of the abdomen and pelvis was performed using the standard protocol following bolus administration of intravenous contrast. RADIATION DOSE REDUCTION: This exam was performed according to the departmental dose-optimization program which includes automated exposure control, adjustment of the mA and/or kV according to patient size and/or use of iterative reconstruction technique. CONTRAST:  75mL OMNIPAQUE IOHEXOL 350 MG/ML SOLN COMPARISON:  Abdominopelvic CT 08/06/2021. FINDINGS: Lower chest: Mild dependent atelectasis at both lung bases. No significant pleural or pericardial effusion. There is a small hiatal hernia. Hepatobiliary: The liver is normal in density without suspicious focal abnormality. No  evidence of gallstones, gallbladder wall thickening or biliary dilatation. Pancreas: Unremarkable. No pancreatic ductal dilatation or surrounding inflammatory changes. Spleen: Normal in size without focal abnormality. Adrenals/Urinary Tract: Both adrenal glands appear normal. Punctate nonobstructing calculus in the upper pole of the right kidney. No evidence of ureteral calculus, hydronephrosis or perinephric soft tissue stranding. No focal renal abnormalities are identified. The bladder appears unremarkable for its degree of distention. Stomach/Bowel: No enteric contrast administered. As above stable small hiatal hernia. The stomach otherwise appears unremarkable for its degree of distention. There is no small bowel distension, wall thickening or surrounding inflammation. The appendix appears normal. Mild gaseous distension of the transverse colon without wall thickening or surrounding inflammation. Possible mild wall thickening within the sigmoid colon. Vascular/Lymphatic: There are no enlarged abdominal or pelvic lymph nodes. Mild aortic and branch vessel atherosclerosis without evidence of aneurysm or large vessel  occlusion. Reproductive: Status post hysterectomy.  No adnexal mass. Other: No evidence of abdominal wall mass or hernia. No ascites or pneumoperitoneum. Stable postsurgical changes within the low anterior abdominal wall. Musculoskeletal: No acute or significant osseous findings. Mild lower lumbar spondylosis and sacroiliac degenerative changes bilaterally. Unless specific follow-up recommendations are mentioned in the findings or impression sections, no imaging follow-up of any mentioned incidental findings is recommended. IMPRESSION: 1. No definite acute findings or explanation for the patient's symptoms. 2. Possible mild wall thickening of the sigmoid colon without surrounding inflammation. Correlate clinically for signs of mild colitis. This could relate to previous pelvic irradiation. 3. Nonobstructing right renal calculus. 4.  Aortic Atherosclerosis (ICD10-I70.0). Electronically Signed   By: Carey Bullocks M.D.   On: 04/11/2023 11:37   CT Head Wo Contrast  Result Date: 04/10/2023 CLINICAL DATA:  Neuro deficit, acute, stroke suspected. EXAM: CT HEAD WITHOUT CONTRAST TECHNIQUE: Contiguous axial images were obtained from the base of the skull through the vertex without intravenous contrast. RADIATION DOSE REDUCTION: This exam was performed according to the departmental dose-optimization program which includes automated exposure control, adjustment of the mA and/or kV according to patient size and/or use of iterative reconstruction technique. COMPARISON:  Head CT 04/01/2023. FINDINGS: Brain: No acute intracranial hemorrhage. Gray-white differentiation is preserved. No hydrocephalus or extra-axial collection. No mass effect or midline shift. Unchanged partially empty sella. Vascular: No hyperdense vessel or unexpected calcification. Skull: No calvarial fracture or suspicious bone lesion. Skull base is unremarkable. Sinuses/Orbits: No acute finding. Other: None. IMPRESSION: No acute intracranial abnormality.  Electronically Signed   By: Orvan Falconer M.D.   On: 04/10/2023 10:52   DG HIP UNILAT WITH PELVIS 2-3 VIEWS RIGHT  Result Date: 04/05/2023 CLINICAL DATA:  Chronic right hip pain.  No known injury. EXAM: DG HIP (WITH OR WITHOUT PELVIS) 2-3V RIGHT COMPARISON:  CT abdomen and pelvis 08/06/2021 FINDINGS: The bilateral femoroacetabular joint spaces appear maintained. There is again mild-to-moderate right and mild left femoral head-neck junction circumferential degenerative osteophytosis. Mild bilateral superolateral acetabular degenerative osteophytes. The bilateral sacroiliac and pubic symphysis joint spaces are maintained. No acute fracture or dislocation. Vascular phleboliths overlie the pelvis. IMPRESSION: Mild-to-moderate right and mild left femoroacetabular osteoarthritis. Electronically Signed   By: Neita Garnet M.D.   On: 04/05/2023 12:48   CT VENOGRAM HEAD  Result Date: 04/01/2023 CLINICAL DATA:  Provided history: Dural venous sinus thrombosis suspected. EXAM: CT VENOGRAM HEAD TECHNIQUE: Venographic phase images of the brain were obtained following the administration of intravenous contrast. Multiplanar reformats and maximum intensity projections were generated. RADIATION DOSE REDUCTION: This exam was performed according to the departmental  dose-optimization program which includes automated exposure control, adjustment of the mA and/or kV according to patient size and/or use of iterative reconstruction technique. CONTRAST:  75mL OMNIPAQUE IOHEXOL 350 MG/ML SOLN COMPARISON:  Head CT 03/31/2023.  Brain MRI 03/31/2023. FINDINGS: The superior sagittal sinus, internal cerebral veins, vein of Galen, straight sinus, transverse sinuses, sigmoid sinuses and visualized jugular veins are patent. No evidence of dural venous sinus thrombosis. No pathologic parenchymal enhancement is identified. IMPRESSION: No evidence of dural venous sinus thrombosis. Electronically Signed   By: Jackey Loge D.O.   On: 04/01/2023  14:33   CT ANGIO HEAD W OR WO CONTRAST  Result Date: 04/01/2023 CLINICAL DATA:  AVM/AVF, high flow vascular malformation. Compared to previous CT. EXAM: CT ANGIOGRAPHY HEAD TECHNIQUE: Multidetector CT imaging of the head was performed using the standard protocol during bolus administration of intravenous contrast. Multiplanar CT image reconstructions and MIPs were obtained to evaluate the vascular anatomy. RADIATION DOSE REDUCTION: This exam was performed according to the departmental dose-optimization program which includes automated exposure control, adjustment of the mA and/or kV according to patient size and/or use of iterative reconstruction technique. CONTRAST:  75mL OMNIPAQUE IOHEXOL 350 MG/ML SOLN COMPARISON:  Brain MRI 03/31/2023. Head CT 03/31/2023. CT angiogram head 06/25/2022. FINDINGS: CT HEAD Brain: No age advanced or lobar predominant parenchymal atrophy. 1.6 x 0.5 cm extra-axial focus of hyperdensity overlying the high left frontal lobe, unchanged in size from the prior head CT of 03/31/2023. This is favored to reflect a small acute extra-axial hemorrhage given the absence of an enhancing mass at this site on the prior brain MRI of 03/31/2023. Contact upon the underlying brain parenchyma, as before. Partially empty sella turcica. No new intracranial hemorrhage is identified elsewhere. No demarcated cortical infarct. No evidence of an intracranial mass. No midline shift. Vascular: No hyperdense vessel.  Atherosclerotic calcifications. Skull: No calvarial fracture or aggressive osseous lesion. Orbits/Sinuses: No mass or acute finding within the imaged orbits. Redemonstrated chronic medially displaced fracture deformity of the left lamina papyracea. Mild mucosal thickening scattered within bilateral ethmoid air cells. Partially imaged mucous retention cyst within the left maxillary sinus. Other: Small volume fluid within the right mastoid air cells. CTA HEAD Please note, due to malfunction of the CT  scanner, the cephalad portion of the head (at the level of the high frontoparietal lobes) is excluded from the field of view. Within this limitation, findings are as follows. Anterior circulation: The intracranial internal carotid arteries are patent. Minimal nonstenotic atherosclerotic plaque within the intracranial left ICA. The M1 middle cerebral arteries are patent. No M2 proximal branch occlusion or high-grade proximal stenosis. The anterior cerebral arteries are patent. No intracranial aneurysm is identified. No evidence of AVM at the imaged levels. Posterior circulation: The intracranial vertebral arteries are patent. The basilar artery is patent. The posterior cerebral arteries are patent. A left posterior communicating artery is present. The right posterior communicating artery is diminutive or absent. Venous sinuses: No evidence of a dural venous sinus thrombosis at the imaged levels. Anatomic variants: As described. Review of the MIP images confirms the above findings. IMPRESSION: Non-contrast head CT: 1. 1.6 x 0.5 cm extra-axial hyperdense focus overlying the high left frontal lobe, stable in size as compared to the head CT of 03/31/2023. This likely reflects a small acute extra-axial hemorrhage (given the lack of an enhancing mass at this site on the brain MRI of 03/31/2023). Contact upon the underlying brain parenchyma, as before. 2. No evidence of an interval acute intracranial abnormality. 3.  Partially empty sella turcica. This finding can reflect incidental anatomic variation, or alternatively, it can be associated with idiopathic intracranial hypertension (pseudotumor cerebri). CTA head: 1. Please note due to malfunction of the CT scanner, the cephalad portion of the head (at the level of the high frontoparietal lobes) is excluded from the field of view. Within this limitation, findings are as follows. 2. No intracranial large vessel occlusion or proximal high-grade arterial stenosis. 3. No  evidence of an AVM at the imaged levels. Electronically Signed   By: Jackey Loge D.O.   On: 04/01/2023 14:11   Overnight EEG with video  Result Date: 04/01/2023 Charlsie Quest, MD     04/01/2023  2:37 PM Patient Name: GABRYELLA ASLAN MRN: [id number] Epilepsy Attending: Charlsie Quest Referring Physician/Provider: Cheri Fowler, MD Duration: 03/31/2023 2048 to 04/01/2023 1407  Patient history: 47 yo F patient called daughter and told her she was not feeling well c/o dizziness. Daughter went to check on patient and found her on the floor unresponsive, marking noted to side of head patient may have struck head on dresser. Per EMS patient had a total of 3 seizures, and received a total of 5 mg IV Versed. On arrival patient posturing and eyes deviated to right. EEG to evaluate for seizure.  Level of alertness: lethargic  AEDs during EEG study: LEV, Ativan  Technical aspects: This EEG study was done with scalp electrodes positioned according to the 10-20 International system of electrode placement. Electrical activity was reviewed with band pass filter of 1-70Hz , sensitivity of 7 uV/mm, display speed of 65mm/sec with a 60Hz  notched filter applied as appropriate. EEG data were recorded continuously and digitally stored.  Video monitoring was available and reviewed as appropriate.  Description: The posterior dominant rhythm consists of 8-9 Hz activity of moderate voltage (25-35 uV) seen predominantly in posterior head regions, symmetric and reactive to eye opening and eye closing. Sleep was characterized by vertex waves, sleep spindles (12 to 14 Hz), maximal frontocentral region. EEG showed intermittent generalized 3-6hz  theta-delta slowing. Hyperventilation and photic stimulation were not performed.    ABNORMALITY - Intermittent slow, generalized  IMPRESSION: This study is suggestive of mild to moderate diffuse encephalopathy, nonspecific etiology. No seizures or epileptiform discharges were seen throughout the  recording.   Charlsie Quest   DG Abd Portable 1V  Result Date: 03/31/2023 CLINICAL DATA:  Orogastric tube placement. EXAM: PORTABLE ABDOMEN - 1 VIEW COMPARISON:  None Available. FINDINGS: Tip and side port of the enteric tube below the diaphragm in the stomach. Nonobstructive upper bowel gas pattern. IMPRESSION: Tip and side port of the enteric tube below the diaphragm in the stomach. Electronically Signed   By: Narda Rutherford M.D.   On: 03/31/2023 18:38   MR BRAIN W WO CONTRAST  Addendum Date: 03/31/2023   ADDENDUM REPORT: 03/31/2023 17:49 ADDENDUM: On further review, there is subtle susceptibility artifact in the region of possible extra-axial hemorrhage seen on same day CT head. This area was better characterized on same day CT head. No significant mass effect or adjacent brain edema. Findings discussed with Dr. Wilford Corner via telephone at 5:49 p.m. Electronically Signed   By: Feliberto Harts M.D.   On: 03/31/2023 17:49   Result Date: 03/31/2023 CLINICAL DATA:  Neuro deficit, acute, stroke suspected EXAM: MRI HEAD WITHOUT AND WITH CONTRAST TECHNIQUE: Multiplanar, multiecho pulse sequences of the brain and surrounding structures were obtained without and with intravenous contrast. CONTRAST:  8.58mL GADAVIST GADOBUTROL 1 MMOL/ML IV SOLN COMPARISON:  CT head 03/31/2023. FINDINGS: Brain: No acute infarction, hemorrhage, hydrocephalus, extra-axial collection or mass lesion. Vascular: Major arterial flow voids are maintained. Skull and upper cervical spine: Normal marrow signal. Sinuses/Orbits: Mild paranasal sinus mucosal thickening. Left maxillary sinus retention cyst. No acute orbital finding. Other: Small right mastoid effusion. IMPRESSION: No evidence of acute intracranial abnormality. Electronically Signed: By: Feliberto Harts M.D. On: 03/31/2023 17:26   DG Chest Portable 1 View  Result Date: 03/31/2023 CLINICAL DATA:  AMS, Seizures EXAM: PORTABLE CHEST 1 VIEW COMPARISON:  04/11/2020. FINDINGS:  Bilateral lung fields are clear. Bilateral costophrenic angles are clear. Normal cardio-mediastinal silhouette. No acute osseous abnormalities. The soft tissues are within normal limits. IMPRESSION: No active disease. Electronically Signed   By: Jules Schick M.D.   On: 03/31/2023 13:04   CT Head Wo Contrast  Result Date: 03/31/2023 CLINICAL DATA:  Provided history: Poly trauma, blunt. Seizure, new onset, no history of trauma. Additional history provided by ER physician: Cancer history. Patient found down with bruising to left cheek. EXAM: CT HEAD WITHOUT CONTRAST CT CERVICAL SPINE WITHOUT CONTRAST TECHNIQUE: Multidetector CT imaging of the head and cervical spine was performed following the standard protocol without intravenous contrast. Multiplanar CT image reconstructions of the cervical spine were also generated. RADIATION DOSE REDUCTION: This exam was performed according to the departmental dose-optimization program which includes automated exposure control, adjustment of the mA and/or kV according to patient size and/or use of iterative reconstruction technique. COMPARISON:  CT angiogram head 06/25/2022. CT venogram head 06/25/2022. Cervical spine MRI 11/05/2014. FINDINGS: CT HEAD FINDINGS Brain: No age advanced or lobar predominant parenchymal atrophy. Extra-axial hyperdense focus overlying the high left frontal lobe, measuring 1.6 x 0.5 cm (best appreciated on series 5, image 46). There is contact upon the underlying brain parenchyma. No demarcated cortical infarct. No extra-axial fluid collection. No midline shift. Vascular: No hyperdense vessel.  Atherosclerotic calcifications. Skull: No calvarial fracture or aggressive osseous lesion. Sinuses/Orbits: No mass or acute finding within the imaged orbits. 2.1 cm mucous retention cyst within the left maxillary sinus. Mild mucosal thickening scattered within bilateral ethmoid air cells. Other: Small-volume fluid within the right mastoid air cells. Anterior  displacement of the mandibular condyles bilaterally. CT CERVICAL SPINE FINDINGS Alignment: Dextrocurvature of the cervical spine. Partially imaged levocurvature of the upper thoracic spine. No significant spondylolisthesis. Skull base and vertebrae: The basion-dental and atlanto-dental intervals are maintained.No evidence of acute fracture to the cervical spine. Soft tissues and spinal canal: No prevertebral fluid or swelling. No visible canal hematoma. Disc levels: Cervical spondylosis with multilevel disc space narrowing, disc bulges/central disc protrusions, posterior disc osteophyte complexes, endplate spurring and uncovertebral hypertrophy. Multilevel spinal canal stenosis, most notably as follows. At C3-C4, a central disc protrusion contributes to apparent moderate spinal canal stenosis. At C4-C5 and C5-C6, posterior disc osteophyte complexes contribute to at least moderate spinal canal stenosis. Multilevel neural foraminal narrowing. Upper chest: No consolidation within the imaged lung apices. No visible pneumothorax. Centrilobular and paraseptal emphysema. Impression #1 called by telephone at the time of interpretation on 03/31/2023 at 10:35 Am to provider [facility] , who verbally acknowledged these results. IMPRESSION: CT head: 1. 1.6 x 0.5 cm hyperdense extra-axial focus overlying the high left frontal lobe. Given recent trauma, this may reflect an acute extra-axial hemorrhage. However, a dural-based mass cannot be excluded. This should be followed up with non-contrast head CTs to resolution, or further evaluated with a brain MRI (with and without contrast). 2. Anterior displacement of the mandibular condyles bilaterally.  Correlate with physical exam findings to exclude TMJ dislocation. 3. Paranasal sinus disease as described. 4. Small-volume fluid within the right mastoid air cells. CT  cervical spine: 1. No evidence of an acute cervical spine fracture. 2. Mild Dextrocurvature of the cervical spine,  possibly positional. 3. Cervical spondylosis as described. 4. Emphysema (ICD10-J43.9). Electronically Signed   By: Jackey Loge D.O.   On: 03/31/2023 12:03   CT Cervical Spine Wo Contrast  Result Date: 03/31/2023 CLINICAL DATA:  Provided history: Poly trauma, blunt. Seizure, new onset, no history of trauma. Additional history provided by ER physician: Cancer history. Patient found down with bruising to left cheek. EXAM: CT HEAD WITHOUT CONTRAST CT CERVICAL SPINE WITHOUT CONTRAST TECHNIQUE: Multidetector CT imaging of the head and cervical spine was performed following the standard protocol without intravenous contrast. Multiplanar CT image reconstructions of the cervical spine were also generated. RADIATION DOSE REDUCTION: This exam was performed according to the departmental dose-optimization program which includes automated exposure control, adjustment of the mA and/or kV according to patient size and/or use of iterative reconstruction technique. COMPARISON:  CT angiogram head 06/25/2022. CT venogram head 06/25/2022. Cervical spine MRI 11/05/2014. FINDINGS: CT HEAD FINDINGS Brain: No age advanced or lobar predominant parenchymal atrophy. Extra-axial hyperdense focus overlying the high left frontal lobe, measuring 1.6 x 0.5 cm (best appreciated on series 5, image 46). There is contact upon the underlying brain parenchyma. No demarcated cortical infarct. No extra-axial fluid collection. No midline shift. Vascular: No hyperdense vessel.  Atherosclerotic calcifications. Skull: No calvarial fracture or aggressive osseous lesion. Sinuses/Orbits: No mass or acute finding within the imaged orbits. 2.1 cm mucous retention cyst within the left maxillary sinus. Mild mucosal thickening scattered within bilateral ethmoid air cells. Other: Small-volume fluid within the right mastoid air cells. Anterior displacement of the mandibular condyles bilaterally. CT CERVICAL SPINE FINDINGS Alignment: Dextrocurvature of the cervical  spine. Partially imaged levocurvature of the upper thoracic spine. No significant spondylolisthesis. Skull base and vertebrae: The basion-dental and atlanto-dental intervals are maintained.No evidence of acute fracture to the cervical spine. Soft tissues and spinal canal: No prevertebral fluid or swelling. No visible canal hematoma. Disc levels: Cervical spondylosis with multilevel disc space narrowing, disc bulges/central disc protrusions, posterior disc osteophyte complexes, endplate spurring and uncovertebral hypertrophy. Multilevel spinal canal stenosis, most notably as follows. At C3-C4, a central disc protrusion contributes to apparent moderate spinal canal stenosis. At C4-C5 and C5-C6, posterior disc osteophyte complexes contribute to at least moderate spinal canal stenosis. Multilevel neural foraminal narrowing. Upper chest: No consolidation within the imaged lung apices. No visible pneumothorax. Centrilobular and paraseptal emphysema. Impression #1 called by telephone at the time of interpretation on 03/31/2023 at 10:35 Am to provider [facility] , who verbally acknowledged these results. IMPRESSION: CT head: 1. 1.6 x 0.5 cm hyperdense extra-axial focus overlying the high left frontal lobe. Given recent trauma, this may reflect an acute extra-axial hemorrhage. However, a dural-based mass cannot be excluded. This should be followed up with non-contrast head CTs to resolution, or further evaluated with a brain MRI (with and without contrast). 2. Anterior displacement of the mandibular condyles bilaterally. Correlate with physical exam findings to exclude TMJ dislocation. 3. Paranasal sinus disease as described. 4. Small-volume fluid within the right mastoid air cells. CT  cervical spine: 1. No evidence of an acute cervical spine fracture. 2. Mild Dextrocurvature of the cervical spine, possibly positional. 3. Cervical spondylosis as described. 4. Emphysema (ICD10-J43.9). Electronically Signed   By: Jackey Loge D.O.  On: 03/31/2023 12:03   EEG adult  Result Date: 03/31/2023 Charlsie Quest, MD     03/31/2023  4:06 PM Patient Name: JOYELLE DEBLASE MRN: [id number] Epilepsy Attending: Charlsie Quest Referring Physician/Provider: Milon Dikes, MD Date: 03/31/2023 Duration: 30.19 mins Patient history: 47 yo F patient called daughter and told her she was not feeling well c/o dizziness. Daughter went to check on patient and found her on the floor unresponsive, marking noted to side of head patient may have struck head on dresser. Per EMS patient had a total of 3 seizures, and received a total of 5 mg IV Versed. On arrival patient posturing and eyes deviated to right. EEG to evaluate for seizure. Level of alertness: lethargic AEDs during EEG study: LEV, Ativan Technical aspects: This EEG study was done with scalp electrodes positioned according to the 10-20 International system of electrode placement. Electrical activity was reviewed with band pass filter of 1-70Hz , sensitivity of 7 uV/mm, display speed of 13mm/sec with a 60Hz  notched filter applied as appropriate. EEG data were recorded continuously and digitally stored.  Video monitoring was available and reviewed as appropriate. Description: No clear posterior dominant rhythm was seen. EEG showed continuous generalized mixed frequencies with predominantly 9-10hz  alpha activity admixed with intermittent generalized 3-6hz  theta-delta slowing as well as 13-15hz  fronto-central beta activity. The amplitude of waves was lower in left temporo-parietal region which could be artifactual due to electrode impedence or underlying structural abnormality. Hyperventilation and photic stimulation were not performed.   Of note, study was technically difficult due to significant electrode and movement artifact. ABNORMALITY - Continuous slow, generalized IMPRESSION: This technically difficult study is suggestive of moderate diffuse encephalopathy, nonspecific etiology. No seizures  or epileptiform discharges were seen throughout the recording. The amplitude of waves was lower in left temporo-parietal region which could be suggestive cortical dysfunction due to underlying structural abnormality. However, difficult to rule out artifact due to the quality of study. Consider repeat EEG if concern for ictal-interictal activity persists. Priyanka Annabelle Harman      Subjective:  No nausea, no vomiting ,no abd pain. Discharge Exam: Vitals:   04/12/23 0000 04/12/23 0316  BP: 116/67 (!) 137/93  Pulse: 65 66  Resp: 16 17  Temp: 98.5 F (36.9 C) 98.5 F (36.9 C)  SpO2: 92% 93%   Vitals:   04/12/23 0000 04/12/23 0316 04/12/23 0816 04/12/23 1326  BP: 116/67 (!) 137/93    Pulse: 65 66    Resp: 16 17    Temp: 98.5 F (36.9 C) 98.5 F (36.9 C)    TempSrc: Oral Oral Oral Oral  SpO2: 92% 93%    Weight:      Height:        General: Pt is alert, awake, not in acute distress Cardiovascular: RRR, S1/S2 +, no rubs, no gallops Respiratory: CTA bilaterally, no wheezing, no rhonchi Abdominal: Soft, NT, ND, bowel sounds + Extremities: no edema, no cyanosis    The results of significant diagnostics from this hospitalization (including imaging, microbiology, ancillary and laboratory) are listed below for reference.     Microbiology: No results found for this or any previous visit (from the past 240 hour(s)).   Labs: BNP (last 3 results) Recent Labs    04/12/23 0046  BNP 181.5*   Basic Metabolic Panel: Recent Labs  Lab 04/10/23 0940 04/10/23 1439 04/11/23 0715 04/12/23 0046  NA 140 142 138 136  K 2.8* 3.6 3.7 3.4*  CL 103 104 104 101  CO2 25 25 23  22  GLUCOSE 123* 100* 109* 104*  BUN 5* <5* <5* 6  CREATININE 0.77 0.59 0.62 0.67  CALCIUM 9.9 9.4 9.6 9.2  MG <0.5*  --  1.0* 1.8  PHOS 3.0  --   --   --    Liver Function Tests: Recent Labs  Lab 04/10/23 0940 04/11/23 0715 04/12/23 0046  AST 38 37 28  ALT 49* 43 39  ALKPHOS 39 50 45  BILITOT 1.0 0.9 0.9   PROT 6.8 7.2 6.8  ALBUMIN 3.6 3.6 3.7   No results for input(s): "LIPASE", "AMYLASE" in the last 168 hours. No results for input(s): "AMMONIA" in the last 168 hours. CBC: Recent Labs  Lab 04/10/23 0940 04/11/23 0715 04/12/23 0046  WBC 13.9* 8.9 9.8  NEUTROABS 10.6*  --  6.0  HGB 12.1 13.0 12.5  HCT 37.4 39.9 38.0  MCV 89.9 89.1 91.8  PLT 352 381 421*   Cardiac Enzymes: No results for input(s): "CKTOTAL", "CKMB", "CKMBINDEX", "TROPONINI" in the last 168 hours. BNP: Invalid input(s): "POCBNP" CBG: Recent Labs  Lab 04/11/23 1704 04/11/23 2102 04/12/23 0815 04/12/23 1321 04/12/23 1529  GLUCAP 80 105* 107* 86 125*   D-Dimer No results for input(s): "DDIMER" in the last 72 hours. Hgb A1c Recent Labs    04/10/23 0940  HGBA1C 5.3   Lipid Profile No results for input(s): "CHOL", "HDL", "LDLCALC", "TRIG", "CHOLHDL", "LDLDIRECT" in the last 72 hours. Thyroid function studies Recent Labs    04/11/23 0722  TSH 1.083   Anemia work up No results for input(s): "VITAMINB12", "FOLATE", "FERRITIN", "TIBC", "IRON", "RETICCTPCT" in the last 72 hours. Urinalysis    Component Value Date/Time   COLORURINE STRAW (A) 04/11/2023 1105   APPEARANCEUR CLEAR 04/11/2023 1105   APPEARANCEUR Clear 07/09/2018 1009   LABSPEC 1.015 04/11/2023 1105   PHURINE 7.0 04/11/2023 1105   GLUCOSEU NEGATIVE 04/11/2023 1105   HGBUR NEGATIVE 04/11/2023 1105   BILIRUBINUR NEGATIVE 04/11/2023 1105   BILIRUBINUR Negative 07/09/2018 1009   KETONESUR NEGATIVE 04/11/2023 1105   PROTEINUR NEGATIVE 04/11/2023 1105   UROBILINOGEN 0.2 12/27/2017 1856   NITRITE NEGATIVE 04/11/2023 1105   LEUKOCYTESUR NEGATIVE 04/11/2023 1105   Sepsis Labs Recent Labs  Lab 04/10/23 0940 04/11/23 0715 04/12/23 0046  WBC 13.9* 8.9 9.8   Microbiology No results found for this or any previous visit (from the past 240 hour(s)).   Time coordinating discharge: Over 30 minutes  SIGNED:   Huey Bienenstock, MD  [facility] 04/12/2023, 5:09 PM Pager   If 7PM-7AM, please contact night-coverage [email or web address]

## 2023-04-12 NOTE — Progress Notes (Signed)
Physical Therapy Treatment Patient Details Name: Sandra Miller MRN: [id number] DOB: [birth year] Today's Date: 04/12/2023   History of Present Illness 47 y.o. female  who was recently discharged due to seizures 5 days ago and 04/10/23 presents due to dizziness associated with nausea, emesis and unsteady gait.  Significant PMH: breast and vulvar CA, HTN, IDA, Graves disease, anxiety/bipolar, OA, PTSD, panic attacks,    PT Comments  Pt is progressing well towards goals. Time spent today working on HEP with pt for cervical there-ex to decrease stiffness, balance to decrease risk for falls and strengthening to improve overall mobility. Pt was provided with demonstration and return demonstration as well as provided with written sheet. Due to pt current functional status, PLOF, home set up and available assistance pt will benefit from skilled physical therapy services 3x/weekly on discharge from acute care hospital setting in order to improve overall functional mobility to return to age related activities. Pt tolerated treatment session well.    Access Code: [email or web address] URL: [email or web address] Date: 04/12/2023 Prepared by: Harrel Carina  Exercises - Seated Cervical Retraction  - 1 x daily - 7 x weekly - 3 sets - 10 reps - Gentle Levator Scapulae Stretch  - 1 x daily - 7 x weekly - 3 sets - 1 reps - 30 seconds hold - Seated Cervical Sidebending AROM  - 1 x daily - 7 x weekly - 3 sets - 1 reps - 20 seconds hold - Seated Shoulder Shrug Circles AROM Backward  - 1 x daily - 7 x weekly - 3 sets - 10 reps - Seated Scapular Retraction  - 1 x daily - 7 x weekly - 3 sets - 10 reps - Sit to Stand Without Arm Support  - 1 x daily - 7 x weekly - 3 sets - 10 reps - Heel Raises with Counter Support  - 1 x daily - 7 x weekly - 3 sets - 10 reps - Sidelying Hip Abduction  - 1 x daily - 7 x weekly - 3 sets - 10 reps - Clamshell  - 1 x daily - 7 x weekly - 3 sets - 10 reps - Standing Single Leg Stance  with Counter Support  - 1 x daily - 7 x weekly - 3 sets - 1 reps - 30 seconds hold - Standing Tandem Balance with Counter Support  - 1 x daily - 7 x weekly - 3 sets - 1 reps - 30 seconds hold   If plan is discharge home, recommend the following: Assistance with cooking/housework;Assist for transportation;Help with stairs or ramp for entrance   Can travel by private vehicle        Equipment Recommendations  None recommended by PT    Recommendations for Other Services       Precautions / Restrictions Precautions Precautions: Fall Restrictions Weight Bearing Restrictions: No     Mobility  Bed Mobility Overal bed mobility: Modified Independent Bed Mobility: Supine to Sit, Sit to Supine     Supine to sit: Modified independent (Device/Increase time) Sit to supine: Modified independent (Device/Increase time)   General bed mobility comments: Minimal use of bed rail for supine to sitting. Patient Response: Cooperative  Transfers Overall transfer level: Modified independent Equipment used: None Transfers: Sit to/from Stand Sit to Stand: Modified independent (Device/Increase time)           General transfer comment: slight increase in time    Ambulation/Gait     General Gait Details: Not focus of session today -  Tilt Bed Tilt Bed Patient Response: Cooperative  Modified Rankin (Stroke Patients Only)       Balance Overall balance assessment: Mild deficits observed, not formally tested      Cognition Arousal: Alert Behavior During Therapy: WFL for tasks assessed/performed Overall Cognitive Status: Within Functional Limits for tasks assessed                 General Comments General comments (skin integrity, edema, etc.): Pt was provided with written HEP and was educated on each exercise with demonstration and return demonstration.      Pertinent Vitals/Pain Pain Assessment Pain Assessment: Faces Faces Pain Scale: Hurts little more Facial  Expression: Grimacing Body Movements: Absence of movements Muscle Tension: Relaxed Pain Location: neck Pain Descriptors / Indicators: Aching Pain Intervention(s): Monitored during session, Limited activity within patient's tolerance     PT Goals (current goals can now be found in the care plan section) Acute Rehab PT Goals Patient Stated Goal: decrease pain in the neck 04/12/23 PT Goal Formulation: With patient Time For Goal Achievement: 04/25/23 Potential to Achieve Goals: Good Progress towards PT goals: Progressing toward goals    Frequency    Min 1X/week      PT Plan Current plan remains appropriate       AM-PAC PT "6 Clicks" Mobility   Outcome Measure  Help needed turning from your back to your side while in a flat bed without using bedrails?: None Help needed moving from lying on your back to sitting on the side of a flat bed without using bedrails?: None Help needed moving to and from a bed to a chair (including a wheelchair)?: None Help needed standing up from a chair using your arms (e.g., wheelchair or bedside chair)?: None Help needed to walk in hospital room?: None Help needed climbing 3-5 steps with a railing? : A Little 6 Click Score: 23    End of Session   Activity Tolerance: Patient tolerated treatment well Patient left: in bed;with call bell/phone within reach Nurse Communication: Mobility status PT Visit Diagnosis: Unsteadiness on feet (R26.81);Other abnormalities of gait and mobility (R26.89)     Time: 1349-1401 PT Time Calculation (min) (ACUTE ONLY): 12 min  Charges:    $Therapeutic Exercise: 8-22 mins PT General Charges $$ ACUTE PT VISIT: 1 Visit                     Harrel Carina, DPT, CLT  Acute Rehabilitation Services Office: [phone] (Secure chat preferred)    Claudia Desanctis 04/12/2023, 2:07 PM

## 2023-04-13 ENCOUNTER — Telehealth: Payer: Self-pay

## 2023-04-13 ENCOUNTER — Ambulatory Visit: Payer: Self-pay | Admitting: Internal Medicine

## 2023-04-13 ENCOUNTER — Other Ambulatory Visit: Payer: Self-pay

## 2023-04-13 ENCOUNTER — Other Ambulatory Visit: Payer: Self-pay | Admitting: Internal Medicine

## 2023-04-13 ENCOUNTER — Telehealth (HOSPITAL_COMMUNITY): Payer: Self-pay | Admitting: *Deleted

## 2023-04-13 ENCOUNTER — Other Ambulatory Visit: Payer: Self-pay | Admitting: Nurse Practitioner

## 2023-04-13 ENCOUNTER — Other Ambulatory Visit (HOSPITAL_COMMUNITY): Payer: Self-pay

## 2023-04-13 DIAGNOSIS — E0590 Thyrotoxicosis, unspecified without thyrotoxic crisis or storm: Secondary | ICD-10-CM

## 2023-04-13 MED ORDER — OMEPRAZOLE 40 MG PO CPDR
40.0000 mg | DELAYED_RELEASE_CAPSULE | Freq: Two times a day (BID) | ORAL | 1 refills | Status: DC
  Filled 2023-04-13: qty 60, 30d supply, fill #0
  Filled 2023-05-17: qty 60, 30d supply, fill #1

## 2023-04-13 MED ORDER — GABAPENTIN 300 MG PO CAPS
300.0000 mg | ORAL_CAPSULE | Freq: Every day | ORAL | 1 refills | Status: DC
  Filled 2023-04-13: qty 30, 30d supply, fill #0
  Filled 2023-05-17: qty 30, 30d supply, fill #1

## 2023-04-13 NOTE — Telephone Encounter (Signed)
Pt's daughter called with concern that on recent hospitalization, 04/10/23 to 04/12/23, for medical, possible seizure, Zyprexa has been d/c. Per d/c summary pt QTC was 56 on admission and was 511  today. Pt next appointment is scheduled for 06/08/23. Was last seen on 03/02/23. Please review and advise.

## 2023-04-13 NOTE — Telephone Encounter (Signed)
She did better on olanzapine for many years.  I will consider starting again olanzapine but she needs to get a new EKG.

## 2023-04-13 NOTE — Telephone Encounter (Signed)
From the [facility] call:   She said she doesn't remember anything about the seizure, and since then she said her body doesn't feel like herself and this has increased her anxiety  She is also very  concerned about her ability to return to work. 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  She was worried that she does not have all of her medications and we reviewed the med list.   She does not have Ajovy or Ubrelvy because they were not coverd by her insurance. Propanolol is on her AVS but she said she was told it was not needed and is not taking it. She would like Dr Laural Benes to clarify this   She would also like to check with Dr Laural Benes about the rousuvastatin.  She said "they"  took her off of it and she wants to make sure that Dr Laural Benes wants her off of it.  Her appointment with Dr Laural Benes is tomorrow- 04/14/2023 and she will call this clinic if she needs a ride

## 2023-04-13 NOTE — Transitions of Care (Post Inpatient/ED Visit) (Signed)
04/13/2023  Name: ALI HARRALSON MRN: [id number] DOB: 1975/10/12  Today's TOC FU Call Status: Today's TOC FU Call Status:: Successful TOC FU Call Completed TOC FU Call Complete Date: 04/13/23  Transition Care Management Follow-up Telephone Call Date of Discharge: 04/12/23 Discharge Facility: [facility]) Type of Discharge: Inpatient Admission How have you been since you were released from the hospital?: Same (She said she doesn't remember anything about the seizure, and since then she said her body doesn't feel like herself and this has increased her anxiety.) Any questions or concerns?: Yes Patient Questions/Concerns:: She is very worried that she just doesn't feel like herself.  She is also very  concerned about her ability to return to work. 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  She was worried that she does not have all of her medications and we reviewed the med list. She does not have Ajovy or Ubrelvy because they were not coverd by her insurance. Propanolol is on her AVS but she said she was told it was not needed and is not taking it. She would like Dr Laural Benes to clarify this   She would also like to check with Dr Laural Benes about the rousuvastatin.  She said "they"  took her off of it and she wants to make sure that Dr Laural Benes wants her off of it. Patient Questions/Concerns Addressed: Notified Provider of Patient Questions/Concerns, Other: (Scheduled her to see PCP tomorrow- 04/14/2023.)  Items Reviewed: Did you receive and understand the discharge instructions provided?: Yes Medications obtained,verified, and reconciled?: Yes (Medications Reviewed) (We reviewed the medication list.  She said she will also have her daughter review this when she comes to see her this afternoon. The patient said she gets confused about her meds since she had a seizure.) Any new allergies since your discharge?: No Dietary orders reviewed?: No Do you have support at  home?: Yes People in Home: child(ren), adult, parent(s) Name of Support/Comfort Primary Source: She lives with her mother and her daughter is coming to see her today.  Medications Reviewed Today: Medications Reviewed Today     Reviewed by Robyne Peers, RN (Case Manager) on 04/13/23 at 1210  Med List Status: <None>   Medication Order Taking? Sig Documenting Provider Last Dose Status Informant  amLODipine (NORVASC) 5 MG tablet [id number]  Take 5 mg by mouth daily. [provider]  Active Self  diclofenac Sodium (VOLTAREN) 1 % GEL [id number]  Apply 4 grams topically 4  times daily as needed.  Patient taking differently: Apply 4 g topically 4 (four) times daily as needed (pain).   Hilts, Michael, MD  Active Self  FLUoxetine (PROZAC) 10 MG capsule [id number]  Take 3 capsules (30 mg total) by mouth daily.  Patient taking differently: Take 30 mg by mouth 3 (three) times daily.   Cleotis Nipper, MD  Active Self  Fremanezumab-vfrm (AJOVY) 225 MG/1.5ML Ivory Broad [id number] No Inject 225 mg into the skin every 30 (thirty) days.  Patient not taking: Reported on 04/13/2023   Anson Fret, MD Not Taking Active Self           Med Note Robyne Peers   Thu Apr 13, 2023 12:08 PM) She said she never obtained this because her insurance did not cover it.   gabapentin (NEURONTIN) 300 MG capsule [id number]  Take 1 capsule (300 mg total) by mouth daily. Pt needs office visit. Meredith Pel, NP  Active Self  letrozole [facility]) 2.5 MG tablet  [id number]  Take 1 tablet by mouth daily. Serena Croissant, MD  Active Self  levETIRAcetam (KEPPRA) 750 MG tablet [id number]  Take 1 tablet (750 mg total) by mouth 2 (two) times daily. Lanae Boast, MD  Active Self  lisinopril (ZESTRIL) 10 MG tablet [id number]  Take 1 tablet (10 mg total) by mouth daily. Stop the Lisinopril/HCTZ combination Marcine Matar, MD  Active Self  LORazepam (ATIVAN) 1 MG tablet [id number]  Take 1 tablet (1 mg total) by mouth 3 (three) times daily as  needed for anxiety  Patient taking differently: Take 1 mg by mouth 3 (three) times daily as needed for anxiety or seizure.   Cleotis Nipper, MD  Active Self  methimazole (TAPAZOLE) 5 MG tablet [id number]  Take one tablet (5 mg dose) by mouth daily.  Patient taking differently: Take 5 mg by mouth daily.     Active Self  Multiple Vitamin (MULTIVITAMIN WITH MINERALS) TABS tablet [id number]  Take 1 tablet by mouth daily. [provider]  Active Self  omeprazole (PRILOSEC) 40 MG capsule [id number]  Take 1 capsule (40 mg total) by mouth in the morning and at bedtime. Meredith Pel, NP  Active Self  ondansetron (ZOFRAN-ODT) 8 MG disintegrating tablet [id number]  Dissolve 1 tablet (8 mg total) by mouth every 8 (eight) hours as needed for nausea or vomiting. Wallis Bamberg, PA-C  Active Self  potassium chloride SA (KLOR-CON M) 20 MEQ tablet [id number]  Take 1 tablet (20 mEq) by mouth daily. Marcine Matar, MD  Active Self   Patient taking differently:   Discontinued 08/18/20 1238 promethazine (PHENERGAN) 25 MG tablet [id number]  Take 1 tablet (25 mg total) by mouth every 8 (eight) hours as needed for nausea or vomiting. Mayers, Cari S, PA-C  Active Self  propranolol (INDERAL) 10 MG tablet [id number]  Take 1 tablet by mouth 2 (two) times daily.  Patient not taking: Reported on 03/06/2023   Marcine Matar, MD  Active Self  senna-docusate (SENOKOT-S) 8.6-50 MG tablet [id number]  Take 2 tablets by mouth at bedtime. For AFTER surgery only, do not take if having diarrhea  Patient taking differently: Take 2 tablets by mouth at bedtime as needed for moderate constipation. For AFTER surgery only, do not take if having diarrhea   Warner Mccreedy D, NP  Active Self           Med Note Robyne Peers   Thu Apr 13, 2023 12:09 PM) The patient said she is not taking this  spironolactone (ALDACTONE) 25 MG tablet [id number]  Take 0.5 tablets (12.5 mg total) by mouth daily. Marcine Matar, MD  Active Self   Ubrogepant (UBRELVY) 100 MG TABS [id number] No Take 1 tablet (100 mg total) by mouth every 2 (two) hours as needed. Maximum 200mg  a day.  Patient not taking: Reported on 04/13/2023   Anson Fret, MD Not Taking Active Self           Med Note Robyne Peers   Thu Apr 13, 2023 12:10 PM) She said she never obtained this due to lack of insurance coverage.             Home Care and Equipment/Supplies: Were Home Health Services Ordered?: No Any new equipment or medical supplies ordered?: Yes Name of Medical supply agency?: She said she received a BSC and walker from the hospital when she was discharged. Were you able to get the equipment/medical supplies?: Yes Do you have any questions related to the use of  the equipment/supplies?: No  Functional Questionnaire: Do you need assistance with bathing/showering or dressing?: Yes (her daughter is coming to help her.) Do you need assistance with meal preparation?: Yes Do you need assistance with eating?: No Do you have difficulty maintaining continence: Yes (history of incontinence) Do you need assistance with getting out of bed/getting out of a chair/moving?: Yes (has walker for ambulation) Do you have difficulty managing or taking your medications?: Yes (She is planning to have her daughter review her medication list with her this afternoon to see if she needs refills.)  Follow up appointments reviewed: PCP Follow-up appointment confirmed?: Yes Date of PCP follow-up appointment?: 04/14/23 Follow-up Provider: [facility] Follow-up appointment confirmed?: Yes Date of Specialist follow-up appointment?: 04/26/23 Follow-Up Specialty Provider:: neurology, 05/15/2023- oncology Do you need transportation to your follow-up appointment?: Yes Transportation Need Intervention Addressed By::  (She said she needs to check with her daughter to see if she can drive her.  I instructed her to call this clinic if she needs a ride and we can  arrange a cab ride for her to the appointment tomorrow.  She stated that prior to the seizure she was drivinng) Do you understand care options if your condition(s) worsen?: Yes-patient verbalized understanding    SIGNATURE Robyne Peers, RN
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
## 2023-04-14 ENCOUNTER — Encounter: Payer: Self-pay | Admitting: Internal Medicine

## 2023-04-14 ENCOUNTER — Other Ambulatory Visit (HOSPITAL_COMMUNITY): Payer: Self-pay

## 2023-04-14 ENCOUNTER — Other Ambulatory Visit: Payer: Self-pay | Admitting: Internal Medicine

## 2023-04-14 ENCOUNTER — Telehealth (HOSPITAL_COMMUNITY): Payer: Self-pay

## 2023-04-14 ENCOUNTER — Ambulatory Visit: Payer: No Typology Code available for payment source | Attending: Internal Medicine | Admitting: Internal Medicine

## 2023-04-14 VITALS — BP 108/75 | HR 97 | Temp 98.9°F | Ht 65.0 in | Wt 186.0 lb

## 2023-04-14 DIAGNOSIS — R1110 Vomiting, unspecified: Secondary | ICD-10-CM | POA: Diagnosis not present

## 2023-04-14 DIAGNOSIS — E876 Hypokalemia: Secondary | ICD-10-CM | POA: Diagnosis not present

## 2023-04-14 DIAGNOSIS — Z09 Encounter for follow-up examination after completed treatment for conditions other than malignant neoplasm: Secondary | ICD-10-CM | POA: Diagnosis not present

## 2023-04-14 DIAGNOSIS — R569 Unspecified convulsions: Secondary | ICD-10-CM | POA: Diagnosis not present

## 2023-04-14 DIAGNOSIS — R531 Weakness: Secondary | ICD-10-CM

## 2023-04-14 DIAGNOSIS — M542 Cervicalgia: Secondary | ICD-10-CM

## 2023-04-14 DIAGNOSIS — R269 Unspecified abnormalities of gait and mobility: Secondary | ICD-10-CM

## 2023-04-14 DIAGNOSIS — E782 Mixed hyperlipidemia: Secondary | ICD-10-CM

## 2023-04-14 DIAGNOSIS — E0590 Thyrotoxicosis, unspecified without thyrotoxic crisis or storm: Secondary | ICD-10-CM

## 2023-04-14 DIAGNOSIS — I10 Essential (primary) hypertension: Secondary | ICD-10-CM

## 2023-04-14 DIAGNOSIS — R7303 Prediabetes: Secondary | ICD-10-CM

## 2023-04-14 MED ORDER — ACCU-CHEK GUIDE VI STRP
ORAL_STRIP | 12 refills | Status: DC
Start: 2023-04-14 — End: 2023-04-26
  Filled 2023-04-14: qty 50, 50d supply, fill #0

## 2023-04-14 MED ORDER — ACCU-CHEK SOFTCLIX LANCETS MISC
12 refills | Status: DC
Start: 2023-04-14 — End: 2023-04-26
  Filled 2023-04-14: qty 100, 90d supply, fill #0

## 2023-04-14 MED ORDER — ACCU-CHEK GUIDE W/DEVICE KIT
PACK | 0 refills | Status: DC
Start: 2023-04-14 — End: 2023-05-01
  Filled 2023-04-14: qty 1, 30d supply, fill #0

## 2023-04-14 NOTE — Progress Notes (Addendum)
Patient ID: [name], female    DOB: 1976-02-22  MRN: [id number]  CC: TOC visit Date of hospitalization 7/26-31/2024 and 8/5-03/2023 Date of call from case worker 04/13/2023   Subjective: Sandra Miller is a 47 y.o. female who presents for TOC.  Her daughter Sandra Miller is with her. Her concerns today include:  Patient with history of HTN, HL, preDM, hypokalemia,  obesity, tob dep, RLS, DVT of the RT IJV and surrounding IJ port 04/2020 GERD/functional dyspepsia (on gabapentin and high-dose omeprazole), hidradenitis suppurativa, breast CA left dx 02/2020 (ERP s/p left mastectomy, neoadjuvant chemotherapy and adjuvant XRT; Dr. Pamelia Miller).  Vulvar cancer (dx 01/2020), HSV infection, genitial warts, bipolar disorder/dep/anx (Dr. Lolly Miller)   Patient has all of her medication bottles with her.  Does not have headache medication Ubrelvy   Patient was hospitalized the end of July with new onset seizures witnessed at home and in the emergency room.  Patient reportedly was having recurrent vomiting for several weeks.  Intubated for airway protection.  CT and MRI of the head negative for any acute pathology, spinal tap negative.  EEG negative.  Started on Keppra.  Cause unclear.  Possible syncopal episode from hypotension/dehydration. -Found to have rhabdomyolysis secondary to seizures.  Patient hydrated.  Told to hold Crestor.  Readmitted 04/10/2023 with urinary incontinence, weakness and dizziness.  CT of the head without acute findings.  CT of the abdomen revealed incidental finding of sigmoid colon wall thickening related to previous radiation.  Noted to have prolonged QT.  Zyprexa held.  Magnesium/potassium low and were replaced.  EKG also showed progression of T wave inversion.  Patient was asymptomatic.  Troponins negative.  Echo revealed preserved EF with no RWMA  Today: Seizure disorder: Taking Keppra as prescribed.  Appointment with neurology on the 21st of this month. Currently ambulating with partial  rolling walker.  Legs feel weak.  Referred to physical therapy post discharge but no call as yet. -Some soreness/pain in the left posterior neck times several months.  No radiation down the arm.  Had CT of the neck and hospital that showed some spondylosis but no acute findings.  Recurrent vomiting: Reports this has been going on for months.  Quit smoking marijuana 3 to 4 months ago.  No new meds that she thinks may be causing it. -Has follow-up appointment with gastroenterology 06/02/2023.  She has been taking Phenergan as needed.  Prediabetes: Most recent A1c was 5.3.  Patient reports blood sugars elevated in the hospital and they were giving her insulin intermittently.  Wonders if she needs to be checking blood sugars.  Hypokalemia: This has been persistent and recurrent.  I had screen with aldosterone/renin ratio earlier this year.  Renin level was elevated but the ratio was normal. -HCTZ DC'd.  I placed her on spironolactone and lisinopril.  Also taking potassium supplement 20 mEq twice a day. -Referred to nephrology.  Appointment later this month.  HTN: Currently on lisinopril, spironolactone and Norvasc 5 mg daily.  Propranolol is on the list.  She takes that as needed for any palpitations associated with hyperthyroidism.  She has not had to take propranolol in several months.  Rhabdomyolysis: Will check CK level today.  If it has normalized, we can restart rosuvastatin. Patient Active Problem List   Diagnosis Date Noted   Prolonged QT interval 04/10/2023   Dizziness 04/10/2023   Seizure (HCC) 03/31/2023   Respiratory depression 03/31/2023   Status epilepticus (HCC) 03/31/2023   Migraine with aura and without status migrainosus,  not intractable 10/03/2022   Pain in right knee 09/07/2022   Bilateral primary osteoarthritis of knee 09/07/2022   Chronic migraine without aura without status migrainosus, not intractable 07/07/2022   Graves disease 05/10/2022   Sebaceous cyst 12/06/2021    Pain in left knee 11/03/2021   Hyperthyroidism 08/13/2021   Bipolar 1 disorder (HCC) 05/07/2021   Obesity (BMI 30.0-34.9) 05/07/2021   Hyperlipidemia 08/12/2020   Prediabetes 08/12/2020   Hypomagnesemia 08/12/2020   Mutation in BRIP1 gene 06/24/2020   Hypokalemia 06/17/2020   Dehydration    Colitis 06/16/2020   Port-A-Cath in place 03/11/2020   Vulvar cancer (HCC) 01/29/2020   Vulvar ulceration 01/23/2020   Essential hypertension 10/31/2019   Nausea and vomiting 12/27/2018   Seasonal allergic rhinitis 12/27/2018   History of ELISA positive for HSV 10/01/2018   Restless leg syndrome 04/01/2016   Nephrolithiasis 03/08/2016   Obstructive pyelonephritis 03/08/2016   Normocytic anemia 03/08/2016   Hidradenitis suppurativa of left axilla 11/03/2015   Loss of weight 09/08/2015   Neck pain on left side 10/22/2014   Genital warts 05/20/2014   GERD (gastroesophageal reflux disease) 12/26/2012   Functional dyspepsia 12/26/2012   Tobacco abuse 08/22/2012   Herpes simplex virus (HSV) infection 02/03/2007     Current Outpatient Medications on File Prior to Visit  Medication Sig Dispense Refill   diclofenac Sodium (VOLTAREN) 1 % GEL Apply 4 grams topically 4  times daily as needed. (Patient taking differently: Apply 4 g topically 4 (four) times daily as needed (pain).) 500 g 6   FLUoxetine (PROZAC) 10 MG capsule Take 3 capsules (30 mg total) by mouth daily. (Patient taking differently: Take 30 mg by mouth 3 (three) times daily.) 90 capsule 2   gabapentin (NEURONTIN) 300 MG capsule Take 1 capsule (300 mg) by mouth daily. 30 capsule 1   letrozole (FEMARA) 2.5 MG tablet Take 1 tablet by mouth daily. 90 tablet 3   levETIRAcetam (KEPPRA) 750 MG tablet Take 1 tablet (750 mg total) by mouth 2 (two) times daily. 60 tablet 0   lisinopril (ZESTRIL) 10 MG tablet Take 1 tablet (10 mg total) by mouth daily. Stop the Lisinopril/HCTZ combination 90 tablet 1   loratadine (CLARITIN REDITABS) 10 MG  dissolvable tablet Take 10 mg by mouth daily. OTC     LORazepam (ATIVAN) 1 MG tablet Take 1 tablet (1 mg total) by mouth 3 (three) times daily as needed for anxiety (Patient taking differently: Take 1 mg by mouth 3 (three) times daily as needed for anxiety or seizure.) 90 tablet 1   methimazole (TAPAZOLE) 5 MG tablet Take one tablet (5 mg dose) by mouth daily. (Patient taking differently: Take 5 mg by mouth daily.) 90 tablet 3   Multiple Vitamin (MULTIVITAMIN WITH MINERALS) TABS tablet Take 1 tablet by mouth daily.     Multiple Vitamins-Minerals (CERTA-VITE PO) Take by mouth daily. OTC     omeprazole (PRILOSEC) 40 MG capsule Take 1 capsule (40 mg) by mouth in the morning and at bedtime. 60 capsule 1   potassium chloride SA (KLOR-CON M) 20 MEQ tablet Take 1 tablet (20 mEq) by mouth daily. 90 tablet 1   promethazine (PHENERGAN) 25 MG tablet Take 1 tablet (25 mg total) by mouth every 8 (eight) hours as needed for nausea or vomiting. 20 tablet 1   senna-docusate (SENOKOT-S) 8.6-50 MG tablet Take 2 tablets by mouth at bedtime. For AFTER surgery only, do not take if having diarrhea (Patient taking differently: Take 2 tablets by mouth at bedtime  as needed for moderate constipation. For AFTER surgery only, do not take if having diarrhea) 30 tablet 0   spironolactone (ALDACTONE) 25 MG tablet Take 0.5 tablets (12.5 mg total) by mouth daily. 60 tablet 1   Ubrogepant (UBRELVY) 100 MG TABS Take 1 tablet (100 mg total) by mouth every 2 (two) hours as needed. Maximum 200mg  a day. 16 tablet 11   Fremanezumab-vfrm (AJOVY) 225 MG/1.5ML SOAJ Inject 225 mg into the skin every 30 (thirty) days. (Patient not taking: Reported on 04/13/2023) 1.5 mL 8   ondansetron (ZOFRAN-ODT) 8 MG disintegrating tablet Dissolve 1 tablet (8 mg total) by mouth every 8 (eight) hours as needed for nausea or vomiting. (Patient not taking: Reported on 04/14/2023) 20 tablet 0   propranolol (INDERAL) 10 MG tablet Take 1 tablet by mouth 2 (two) times  daily. (Patient not taking: Reported on 03/06/2023) 180 tablet 1   [DISCONTINUED] prochlorperazine (COMPAZINE) 10 MG tablet TAKE 1 TABLET BY MOUTH EVERY 6 HOURS AS NEEDED FOR NAUSEA OR VOMITING (Patient taking differently: Take 10 mg by mouth every 6 (six) hours as needed for nausea or vomiting.) 30 tablet 1   No current facility-administered medications on file prior to visit.    Allergies  Allergen Reactions   Dilaudid [Hydromorphone Hcl] Other (See Comments)    Pt became confused, pulled out iv's, does not remember anything   Aspirin Hives    States able to tolerate Goody Powders and Ibuprofen without any problem    Depakote [Divalproex Sodium] Nausea And Vomiting   Minocycline Hives    Social History   Socioeconomic History   Marital status: Single    Spouse name: Not on file   Number of children: Not on file   Years of education: Not on file   Highest education level: Not on file  Occupational History   Not on file  Tobacco Use   Smoking status: Every Day    Current packs/day: 0.50    Average packs/day: 0.5 packs/day for 22.0 years (11.0 ttl pk-yrs)    Types: Cigarettes   Smokeless tobacco: Never   Tobacco comments:    Recently started a smoking cessation class.   Vaping Use   Vaping status: Never Used  Substance and Sexual Activity   Alcohol use: Not Currently   Drug use: Yes    Types: Marijuana   Sexual activity: Yes    Partners: Male    Birth control/protection: Surgical  Other Topics Concern   Not on file  Social History Narrative   Former [facility] patient.      Was on disability at one point.   Return to the workforce.  40 hours a week at [employer], 10 hours a week on the weekends at [employer] in [place], [employer] at night 10 to 12 hours a week.      Has grown children, she lives alone with a pet, continues to smoke no alcohol or drug use at this time      History of EtOH abuse and THC use.         Social Determinants of Health    Financial Resource Strain: Medium Risk (01/24/2022)   Received from [facility], [facility]   Overall Financial Resource Strain (CARDIA)    Difficulty of Paying Living Expenses: Somewhat hard  Food Insecurity: No Food Insecurity (04/10/2023)   Hunger Vital Sign    Worried About Running Out of Food in the Last Year: Never true    Ran Out of Food in  the Last Year: Never true  Transportation Needs: No Transportation Needs (04/10/2023)   PRAPARE - Administrator, Civil Service (Medical): No    Lack of Transportation (Non-Medical): No  Physical Activity: Inactive (01/24/2022)   Received from [facility], [facility]   Exercise Vital Sign    Days of Exercise per Week: 0 days    Minutes of Exercise per Session: 0 min  Stress: Stress Concern Present (01/24/2022)   Received from [facility], [facility] - Occupational Stress Questionnaire    Feeling of Stress : Very much  Social Connections: Unknown (02/12/2023)   Received from [facility], [facility]   Social Network    Social Network: Not on file  Intimate Partner Violence: Not At Risk (04/10/2023)   Humiliation, Afraid, Rape, and Kick questionnaire    Fear of Current or Ex-Partner: No    Emotionally Abused: No    Physically Abused: No    Sexually Abused: No    Family History  Problem Relation Age of Onset   Heart disease Father    Lung cancer Father        d. 61   Alcohol abuse Father    Heart disease Mother    Depression Mother    Anxiety disorder Mother    Drug abuse Brother    Alcohol abuse Brother    Drug abuse Brother    ADD / ADHD Brother    Colon polyps Brother    Cancer Paternal Grandfather        "stomach"   Diabetes Maternal Grandfather    Diabetes Paternal Grandmother    Kidney disease Maternal Uncle    Cirrhosis Cousin        alcoholic   Anxiety disorder Maternal Aunt    Depression Maternal Aunt    Cancer Cousin        maternal; ovarian  cancer or other "female" cancer?   Lung cancer Paternal Uncle 29   Throat cancer Cousin        paternal; dx 41s   Lung cancer Cousin        paternal; dx 53s    Past Surgical History:  Procedure Laterality Date   CESAREAN SECTION  1995   w/  Bilateral Tubal Ligation   COLONOSCOPY  last one 08-09-2013   CYSTOSCOPY W/ URETERAL STENT PLACEMENT Left 03/29/2016   Procedure: CYSTOSCOPY WITH STENT REPLACEMENT;  Surgeon: Hildred Laser, MD;  Location[facility];  Service: Urology;  Laterality: Left;   CYSTOSCOPY WITH RETROGRADE PYELOGRAM, URETEROSCOPY AND STENT PLACEMENT Left 03/08/2016   Procedure: CYSTOSCOPY WITH  LEFT RETROGRADE PYELOGRAM, AND STENT PLACEMENT;  Surgeon: Hildred Laser, MD;  Location: [facility];  Service: Urology;  Laterality: Left;   CYSTOSCOPY/RETROGRADE/URETEROSCOPY/STONE EXTRACTION WITH BASKET Left 03/29/2016   Procedure: CYSTOSCOPY/RETROGRADE/URETEROSCOPY/STONE EXTRACTION WITH BASKET;  Surgeon: Hildred Laser, MD;  Location: [facility];  Service: Urology;  Laterality: Left;   ENDOMETRIAL ABLATION W/ NOVASURE  04-01-2010   ESOPHAGOGASTRODUODENOSCOPY  last one 08-09-2013   KNEE ARTHROSCOPY Left as teen   LASER ABLATION OF THE CERVIX  2012 approx   MASTECTOMY WITH AXILLARY LYMPH NODE DISSECTION Left 07/27/2020   Procedure: LEFT MASTECTOMY WITH LEFT RADIOACTIVE SEED GUIDED TARGETED AXILLARY LYMPH NODE DISSECTION;  Surgeon: Ovidio Kin, MD;  [facility];  Service: General;  Laterality: Left;   PORT-A-CATH REMOVAL Right 05/18/2021   Procedure: REMOVAL PORT-A-CATH;  Surgeon: Manus Rudd, MD;  Location: [facility];  Service: General;  Laterality: Right;   PORTACATH PLACEMENT Right 03/06/2020   Procedure: INSERTION PORT-A-CATH WITH ULTRASOUND GUIDANCE;  Surgeon: Ovidio Kin, MD;  Location: [facility];  Service: General;  Laterality: Right;   ROBOTIC ASSISTED TOTAL HYSTERECTOMY WITH BILATERAL SALPINGO OOPHERECTOMY N/A  05/18/2021   Procedure: XI ROBOTIC ASSISTED TOTAL HYSTERECTOMY WITH BILATERAL SALPINGO OOPHORECTOMY;  Surgeon: Adolphus Birchwood, MD;  Location: [facility];  Service: Gynecology;  Laterality: N/A;   TRANSTHORACIC ECHOCARDIOGRAM  05-19-2006   lvsf normal, ef 55-65%, there was mild flattening of the interventricular septum during diastoli/  RV size at upper limits normal   TUBAL LIGATION     VULVA /PERINEUM BIOPSY N/A 05/18/2021   Procedure: VULVAR BIOPSY;  Surgeon: Adolphus Birchwood, MD;  Location: [facility];  Service: Gynecology;  Laterality: N/A;   WISDOM TOOTH EXTRACTION  age 57 's    ROS: Review of Systems Negative except as stated above  PHYSICAL EXAM: BP 108/75 (BP Location: Left Arm, Patient Position: Sitting, Cuff Size: Normal)   Pulse 97   Temp 98.9 F (37.2 C) (Oral)   Ht 5\' 5"  (1.651 m)   Wt 186 lb (84.4 kg)   SpO2 96%   BMI 30.95 kg/m   Physical Exam  General appearance - alert, well appearing, and in no distress Mental status -patient with flat affect. Mouth -oral mucosa is moist. Neck - supple, no significant adenopathy Chest - clear to auscultation, no wheezes, rales or rhonchi, symmetric air entry Heart - normal rate, regular rhythm, normal S1, S2, no murmurs, rubs, clicks or gallops Neurological -power in both upper extremities 5/5 proximally and distally.  Power in the lower extremities 5/5 distally.  4/5 proximally.  Ambulates with a walker.  She has a low foot to floor clearance Extremities -no lower extremity edema. MSK: Mild tenderness on palpation of the trapezius muscle on the left side.  She has good range of motion of the neck.     Latest Ref Rng & Units 04/12/2023   12:46 AM 04/11/2023    7:15 AM 04/10/2023    2:39 PM  CMP  Glucose 70 - 99 mg/dL 696  295  284   BUN 6 - 20 mg/dL 6  <5  <5   Creatinine 0.44 - 1.00 mg/dL 1.32  4.40  1.02   Sodium 135 - 145 mmol/L 136  138  142   Potassium 3.5 - 5.1 mmol/L 3.4  3.7  3.6   Chloride 98 - 111 mmol/L 101  104  104   CO2 22 - 32  mmol/L 22  23  25    Calcium 8.9 - 10.3 mg/dL 9.2  9.6  9.4   Total Protein 6.5 - 8.1 g/dL 6.8  7.2    Total Bilirubin 0.3 - 1.2 mg/dL 0.9  0.9    Alkaline Phos 38 - 126 U/L 45  50    AST 15 - 41 U/L 28  37    ALT 0 - 44 U/L 39  43     Lipid Panel     Component Value Date/Time   CHOL 159 08/18/2022 1605   TRIG 120 08/18/2022 1605   HDL 51 08/18/2022 1605   CHOLHDL 3.1 08/18/2022 1605   CHOLHDL 4.7 Ratio 06/13/2008 2303   VLDL 32 06/13/2008 2303   LDLCALC 87 08/18/2022 1605    CBC    Component Value Date/Time   WBC 9.8 04/12/2023 0046   RBC 4.14 04/12/2023 0046   HGB 12.5 04/12/2023 0046   HGB  12.9 11/22/2022 1633   HCT 38.0 04/12/2023 0046   HCT 38.6 11/22/2022 1633   PLT 421 (H) 04/12/2023 0046   PLT 318 11/22/2022 1633   MCV 91.8 04/12/2023 0046   MCV 89 11/22/2022 1633   MCH 30.2 04/12/2023 0046   MCHC 32.9 04/12/2023 0046   RDW 13.9 04/12/2023 0046   RDW 13.6 11/22/2022 1633   LYMPHSABS 3.2 04/12/2023 0046   LYMPHSABS 3.9 (H) 11/22/2022 1633   MONOABS 0.5 04/12/2023 0046   EOSABS 0.1 04/12/2023 0046   EOSABS 0.1 11/22/2022 1633   BASOSABS 0.0 04/12/2023 0046   BASOSABS 0.0 11/22/2022 1633    ASSESSMENT AND PLAN: 1. Hospital discharge follow-up   2. New onset seizure (HCC) Cause is unclear. Patient to continue Keppra 750 mg twice a day.  Advised that she will need to be seizure-free for at least 6 months before she can drive again.  Keep upcoming appointment with neurology later this month. Patient to stay out of work until she follows up with me again in 7 weeks.  Letter given. 3. Recurrent vomiting Cause is unclear.  Will check a gastric emptying study.  Will need to hold antiemetics for several days prior to study. - NM GASTRIC EMPTYING SOLID LIQUID BOTH W/SB TRANSIT; Future  4. Hypokalemia Continue potassium 20 mEq 1 tablet twice a day, spironolactone 12.5 mg half a tablet daily and lisinopril 10 mg daily. Keep upcoming appointment with nephrology. -  Basic Metabolic Panel - Magnesium  5. Essential hypertension Blood pressure little on the low side.  Stop Norvasc 5 mg daily.  Continue spironolactone 12.5 mg daily and lisinopril 10 mg daily.  6. Prediabetes A1c has actually normalized.  Blood sugars may have been running high in the hospital due to acute events.  However patient is nervous about what her blood sugars may be running.  I have prescribed testing supplies for her to check blood sugars once a day before breakfast. - glucose blood (ACCU-CHEK GUIDE) test strip; Use to check blood sugar daily  Dispense: 100 each; Refill: 12 - Blood Glucose Monitoring Suppl (ACCU-CHEK GUIDE) w/Device KIT; Use to check blood sugar daily  Dispense: 1 kit; Refill: 0 - Accu-Chek Softclix Lancets lancets; Use as directed to check blood sugar daily  Dispense: 100 each; Refill: 12  7. Mixed hyperlipidemia Check CK today.  If normalized, we can restart Crestor.  8. Neck pain on left side MSK in nature and not consistent with cervical radiculopathy.  I recommend using some topical rub like Voltaren gel.  9. Weakness - Ambulatory referral to Physical Therapy - CK  10. Gait disturbance - Ambulatory referral to Physical Therapy  11. Hyperthyroidism - TSH+T4F+T3Free    Patient was given the opportunity to ask questions.  Patient verbalized understanding of the plan and was able to repeat key elements of the plan.   This documentation was completed using Paediatric nurse.  Any transcriptional errors are unintentional.  Orders Placed This Encounter  Procedures   NM GASTRIC EMPTYING SOLID LIQUID BOTH W/SB TRANSIT   Basic Metabolic Panel   Magnesium   TSH+T4F+T3Free   CK   Ambulatory referral to Physical Therapy     Requested Prescriptions   Signed Prescriptions Disp Refills   glucose blood (ACCU-CHEK GUIDE) test strip 100 each 12    Sig: Use to check blood sugar daily   Blood Glucose Monitoring Suppl (ACCU-CHEK GUIDE)  w/Device KIT 1 kit 0    Sig: Use to check blood sugar daily  Accu-Chek Softclix Lancets lancets 100 each 12    Sig: Use as directed to check blood sugar daily    Return in about 7 weeks (around 06/02/2023).  Jonah Blue, MD, FACP

## 2023-04-14 NOTE — Patient Instructions (Addendum)
Stop amlodipine.  Continue lisinopril and spironolactone.  We have ordered a gastric emptying study.  I have resubmitted the referral for physical therapy.  You should not drive until you are seizure-free for at least 6 months.  Use Voltaren gel on the neck.

## 2023-04-14 NOTE — Telephone Encounter (Signed)
Attempted to call patient to let her know she needs an updated EKG, I was able to leave a voicemail for patient to call back

## 2023-04-15 ENCOUNTER — Telehealth: Payer: Self-pay | Admitting: Internal Medicine

## 2023-04-15 ENCOUNTER — Other Ambulatory Visit (HOSPITAL_COMMUNITY): Payer: Self-pay

## 2023-04-15 NOTE — Telephone Encounter (Signed)
Phone call placed to patient this afternoon to go over lab results. Patient informed that magnesium level is very low.  Patient states she has said magnesium oxide 400 mg at home that she was previously taking.  Advised her to start taking it again twice a day.  I will send a new prescription to the pharmacy for her to have when she runs out of her current bottle.  Return to the lab in a week and a half for repeat magnesium level. Potassium level is normal. Calcium level elevated at 10.9. Thyroid level is normal. CK level normal.  Continue to hold Crestor until next visit. Results for orders placed or performed in visit on 04/14/23  Basic Metabolic Panel  Result Value Ref Range   Glucose 90 70 - 99 mg/dL   BUN 7 6 - 24 mg/dL   Creatinine, Ser 1.61 0.57 - 1.00 mg/dL   eGFR 096 >04 VW/UJW/1.19   BUN/Creatinine Ratio 10 9 - 23   Sodium 140 134 - 144 mmol/L   Potassium 4.4 3.5 - 5.2 mmol/L   Chloride 100 96 - 106 mmol/L   CO2 19 (L) 20 - 29 mmol/L   Calcium 10.9 (H) 8.7 - 10.2 mg/dL  Magnesium  Result Value Ref Range   Magnesium 0.9 (LL) 1.6 - 2.3 mg/dL  [id number]  Result Value Ref Range   TSH 2.170 0.450 - 4.500 uIU/mL   T3, Free 3.1 2.0 - 4.4 pg/mL   Free T4 1.17 0.82 - 1.77 ng/dL  CK  Result Value Ref Range   Total CK 172 32 - 182 U/L   *Note: Due to a large number of results and/or encounters for the requested time period, some results have not been displayed. A complete set of results can be found in Results Review.

## 2023-04-17 ENCOUNTER — Other Ambulatory Visit (HOSPITAL_COMMUNITY): Payer: Self-pay

## 2023-04-17 ENCOUNTER — Other Ambulatory Visit: Payer: Self-pay

## 2023-04-17 MED ORDER — PROPRANOLOL HCL 10 MG PO TABS
10.0000 mg | ORAL_TABLET | Freq: Two times a day (BID) | ORAL | 0 refills | Status: DC
Start: 2023-04-17 — End: 2023-07-11
  Filled 2023-04-17: qty 60, 30d supply, fill #0
  Filled 2023-05-17: qty 60, 30d supply, fill #1
  Filled 2023-06-13: qty 60, 30d supply, fill #2

## 2023-04-17 NOTE — Telephone Encounter (Signed)
Requested Prescriptions  Pending Prescriptions Disp Refills   propranolol (INDERAL) 10 MG tablet 180 tablet 0    Sig: Take 1 tablet by mouth 2 (two) times daily.     Cardiovascular:  Beta Blockers Passed - 04/14/2023  6:54 PM      Passed - Last BP in normal range    BP Readings from Last 1 Encounters:  04/14/23 108/75         Passed - Last Heart Rate in normal range    Pulse Readings from Last 1 Encounters:  04/14/23 97         Passed - Valid encounter within last 6 months    Recent Outpatient Visits           3 days ago Hospital discharge follow-up   [place] [facility] Marcine Matar, MD   4 months ago Essential hypertension   [place] [facility] Marcine Matar, MD   4 months ago Gastroenteritis   [place] [facility] Hoy Register, MD   8 months ago Annual physical exam   [facility] Marcine Matar, MD   1 year ago Moderate major depression [facility])   [place] [facility] Marcine Matar, MD       Future Appointments             In 1 week Anson Fret, MD [facility]   In 1 month Laural Benes Binnie Rail, MD [facility]   In 5 months Butch Penny, NP [facility]

## 2023-04-19 ENCOUNTER — Other Ambulatory Visit (HOSPITAL_COMMUNITY): Payer: Self-pay

## 2023-04-20 ENCOUNTER — Ambulatory Visit: Payer: No Typology Code available for payment source | Admitting: Family Medicine

## 2023-04-20 ENCOUNTER — Other Ambulatory Visit (HOSPITAL_COMMUNITY): Payer: Self-pay

## 2023-04-20 ENCOUNTER — Telehealth (HOSPITAL_COMMUNITY): Payer: Self-pay | Admitting: *Deleted

## 2023-04-20 ENCOUNTER — Other Ambulatory Visit (HOSPITAL_COMMUNITY): Payer: Self-pay | Admitting: *Deleted

## 2023-04-20 MED ORDER — LAMOTRIGINE 25 MG PO TABS
ORAL_TABLET | ORAL | 0 refills | Status: DC
  Filled 2023-04-20: qty 53, 30d supply, fill #0

## 2023-04-20 NOTE — Telephone Encounter (Signed)
Unless we do not have recent EKG cannot start any antipsychotic medication.  She can try Lamictal 25 mg daily for 1 week and then 50 mg daily.  She had to watch carefully about the rash.  Other option is available but will need a fresh EKG.

## 2023-04-20 NOTE — Telephone Encounter (Signed)
Writer spoke with again r/t the Zyprexa being d/c during recent hospitalizations for seizure activity r/t hypokalemia and hypomagnesemia. Pt says he is still very depressed and having crying spells daily. Pt denies any SI. Pt has been referred to cardiology and nephrology she says and will have EKG then. There is not a cardiology appointment scheduled in EMR as of yet. Pt last EKG in chart has a QTC of 496/511. Pt next appointment in our office is scheduled for 06/08/23. Please review and advise.

## 2023-04-20 NOTE — Telephone Encounter (Signed)
Pt advised to call PCP to see if they do EKG's. If not pt will call and let us know. Pt agrees to start Lamictal, dosing schedule reviewed. Pt advised to be aware of any rash and to stop medication and call us immediately. Do not let rash progress. Pt verbalizes understanding. Rx sent to [facility] pharmacy for Lamictal 25 mg x 7 days then increase to 50 mg every day.

## 2023-04-21 ENCOUNTER — Ambulatory Visit: Payer: No Typology Code available for payment source | Attending: Family Medicine

## 2023-04-21 ENCOUNTER — Other Ambulatory Visit: Payer: Self-pay | Admitting: Internal Medicine

## 2023-04-21 ENCOUNTER — Other Ambulatory Visit (HOSPITAL_COMMUNITY): Payer: Self-pay

## 2023-04-21 ENCOUNTER — Encounter: Payer: Self-pay | Admitting: Internal Medicine

## 2023-04-21 ENCOUNTER — Telehealth: Payer: Self-pay | Admitting: Internal Medicine

## 2023-04-21 DIAGNOSIS — M25512 Pain in left shoulder: Secondary | ICD-10-CM

## 2023-04-21 DIAGNOSIS — R9431 Abnormal electrocardiogram [ECG] [EKG]: Secondary | ICD-10-CM

## 2023-04-21 NOTE — Telephone Encounter (Signed)
Copied from CRM [id number]. Topic: Appointment Scheduling - Scheduling Inquiry for Clinic >> Apr 21, 2023 12:24 PM Runell Gess P wrote: Reason for CRM: pt needs an EKG post her HFU for her psychiatrist .  Does she need an ov ?  CB#  [phone]

## 2023-04-22 ENCOUNTER — Other Ambulatory Visit: Payer: Self-pay | Admitting: Internal Medicine

## 2023-04-22 LAB — VITAMIN D 25 HYDROXY (VIT D DEFICIENCY, FRACTURES): Vit D, 25-Hydroxy: 30.7 ng/mL (ref 30.0–100.0)

## 2023-04-22 LAB — PTH, INTACT AND CALCIUM
Calcium: 10.5 mg/dL — ABNORMAL HIGH (ref 8.7–10.2)
PTH: 18 pg/mL (ref 15–65)

## 2023-04-22 LAB — MAGNESIUM: Magnesium: 0.6 mg/dL — CL (ref 1.6–2.3)

## 2023-04-22 MED ORDER — MAGNESIUM 400 MG PO TABS
800.0000 mg | ORAL_TABLET | Freq: Two times a day (BID) | ORAL | 1 refills | Status: AC
  Filled 2023-04-22 – 2024-04-01 (×4): qty 120, fill #0

## 2023-04-23 ENCOUNTER — Telehealth: Payer: Self-pay | Admitting: Internal Medicine

## 2023-04-23 NOTE — Telephone Encounter (Signed)
PC placed to pt this afternoon to discuss lab results from yesterday.  Mg came back low at 0.6 despite her taking Mg Oxide 400 mg BID.  I had sent her a message yesterday telling her to increase Mg Oxide to 800 mg BID.  I inquired whether she was having any vomiting or diarrhea.  Pt stated no vomiting but diarrhea some days.  No abdominal pain or fever. Denies any cramps/spasms, tremors. Pt is on Omeprazole 40 mg BID for GERD with functional dyspepsia.  I suspect the high dose PPI which she has been on for a few yrs now is likely playing a role in the low Mg and in turn causing renal potassium wasting.  Ideally, I would like for her to d/c the Omeprazole, but to prevent rebound symptoms, we will start with having her decrease Omeprazole to 40 mg once a day. Return to lab in 1 wk for recheck lytes.  She also has appt this week with nephrology.  Ca+ level was in low normal range.  Pt states she just purchased Vit D 1000 international units.  In Mychart message last evening, I had told her to take 400 international units daily.  Since she has the 1000 already, instead of wasting it, I told her she can take 1 tab three times a week.

## 2023-04-24 ENCOUNTER — Other Ambulatory Visit (HOSPITAL_COMMUNITY): Payer: Self-pay

## 2023-04-24 ENCOUNTER — Ambulatory Visit (HOSPITAL_COMMUNITY)
Admission: RE | Admit: 2023-04-24 | Discharge: 2023-04-24 | Disposition: A | Discharge status: Home / Self Care | Payer: PRIVATE HEALTH INSURANCE | Source: Ambulatory Visit | Attending: Internal Medicine | Admitting: Internal Medicine

## 2023-04-24 DIAGNOSIS — R1110 Vomiting, unspecified: Secondary | ICD-10-CM | POA: Diagnosis present

## 2023-04-24 MED ORDER — TECHNETIUM TC 99M SULFUR COLLOID
2.0300 | Freq: Once | INTRAVENOUS | Status: AC | PRN
  Administered 2023-04-24: 2.03 via INTRAVENOUS

## 2023-04-24 NOTE — Telephone Encounter (Signed)
Called & spoke to the patient. Verified name & DOB. Scheduled a nurse appointment for EKG on 05/01/2023. Patient confirmed appointment.

## 2023-04-24 NOTE — Progress Notes (Signed)
Called & spoke to the patient. Verified name & DOB. Scheduled a nurse appointment for EKG on 05/01/2023. Patient confirmed appointment.

## 2023-04-25 ENCOUNTER — Ambulatory Visit: Payer: PRIVATE HEALTH INSURANCE | Attending: Internal Medicine

## 2023-04-25 DIAGNOSIS — R269 Unspecified abnormalities of gait and mobility: Secondary | ICD-10-CM | POA: Diagnosis not present

## 2023-04-25 DIAGNOSIS — R262 Difficulty in walking, not elsewhere classified: Secondary | ICD-10-CM | POA: Insufficient documentation

## 2023-04-25 DIAGNOSIS — R2681 Unsteadiness on feet: Secondary | ICD-10-CM | POA: Diagnosis present

## 2023-04-25 DIAGNOSIS — R531 Weakness: Secondary | ICD-10-CM | POA: Insufficient documentation

## 2023-04-25 DIAGNOSIS — R2689 Other abnormalities of gait and mobility: Secondary | ICD-10-CM | POA: Diagnosis present

## 2023-04-25 DIAGNOSIS — M6281 Muscle weakness (generalized): Secondary | ICD-10-CM | POA: Insufficient documentation

## 2023-04-25 NOTE — Therapy (Signed)
OUTPATIENT PHYSICAL THERAPY LOWER EXTREMITY EVALUATION   Patient Name: Sandra Miller MRN: [id number] DOB:1975-10-06, 47 y.o., female Today's Date: 04/25/2023  END OF SESSION:  PT End of Session - 04/25/23 1033     Visit Number 1    Number of Visits 13    Date for PT Re-Evaluation 06/06/23    Authorization Type [employer]    PT Start Time 0917    PT Stop Time 1005    PT Time Calculation (min) 48 min    Activity Tolerance Patient tolerated treatment well    Behavior During Therapy WFL for tasks assessed/performed   impaired memory            Past Medical History:  Diagnosis Date   Arthritis    Bipolar 1 disorder (HCC)    Cancer (HCC)    vulva, and breast   Complication of anesthesia    wakes up during procedures   Depression    GAD (generalized anxiety disorder)    Genital HSV    currently per pt  no break out 03-22-2016    GERD (gastroesophageal reflux disease)    Graves disease    Hiatal hernia    History of cervical dysplasia    2012 laser ablation   History of esophageal dilatation    for dysphasia -- x2 dilated   History of gastric ulcer    History of Helicobacter pylori infection    remote hx   History of hidradenitis suppurativa    "gets all over body intermittantly"     History of hypertension    no issue since stopped drinking alcohol 2014   History of kidney stones    History of panic attacks    History of radiation therapy 03/16/2020-05/08/2020   vulva  Dr Antony Blackbird   History of radiation therapy 09/09/2020-10/23/2020   left chest wall/left SCV   Dr Antony Blackbird   Hypertension    Iron deficiency anemia    Left ureteral stone    OCD (obsessive compulsive disorder)    PONV (postoperative nausea and vomiting)    Pre-diabetes    PTSD (post-traumatic stress disorder)    Recovering alcoholic in remission (HCC)    since 2014   RLS (restless legs syndrome)    Smokers' cough (HCC)    Thyroid disease    Urgency of urination    Yeast infection  involving the vagina and surrounding area    secondary to taking antibiotic   Past Surgical History:  Procedure Laterality Date   CESAREAN SECTION  1995   w/  Bilateral Tubal Ligation   COLONOSCOPY  last one 08-09-2013   CYSTOSCOPY W/ URETERAL STENT PLACEMENT Left 03/29/2016   Procedure: CYSTOSCOPY WITH STENT REPLACEMENT;  Surgeon: Hildred Laser, MD;  Location: [facility];  Service: Urology;  Laterality: Left;   CYSTOSCOPY WITH RETROGRADE PYELOGRAM, URETEROSCOPY AND STENT PLACEMENT Left 03/08/2016   Procedure: CYSTOSCOPY WITH  LEFT RETROGRADE PYELOGRAM, AND STENT PLACEMENT;  Surgeon: Hildred Laser, MD;  Location: [facility];  Service: Urology;  Laterality: Left;   CYSTOSCOPY/RETROGRADE/URETEROSCOPY/STONE EXTRACTION WITH BASKET Left 03/29/2016   Procedure: CYSTOSCOPY/RETROGRADE/URETEROSCOPY/STONE EXTRACTION WITH BASKET;  Surgeon: Hildred Laser, MD;  Location: [facility];  Service: Urology;  Laterality: Left;   ENDOMETRIAL ABLATION W/ NOVASURE  04-01-2010   ESOPHAGOGASTRODUODENOSCOPY  last one 08-09-2013   KNEE ARTHROSCOPY Left as teen   LASER ABLATION OF THE CERVIX  2012 approx   MASTECTOMY WITH AXILLARY LYMPH NODE DISSECTION Left 07/27/2020  Procedure: LEFT MASTECTOMY WITH LEFT RADIOACTIVE SEED GUIDED TARGETED AXILLARY LYMPH NODE DISSECTION;  Surgeon: Ovidio Kin, MD;  Location[facility];  Service: General;  Laterality: Left;   PORT-A-CATH REMOVAL Right 05/18/2021   Procedure: REMOVAL PORT-A-CATH;  Surgeon: Manus Rudd, MD;  Location: [facility];  Service: General;  Laterality: Right;   PORTACATH PLACEMENT Right 03/06/2020   Procedure: INSERTION PORT-A-CATH WITH ULTRASOUND GUIDANCE;  Surgeon: Ovidio Kin, MD;  Location: [facility];  Service: General;  Laterality: Right;   ROBOTIC ASSISTED TOTAL HYSTERECTOMY WITH BILATERAL SALPINGO OOPHERECTOMY N/A 05/18/2021   Procedure: XI ROBOTIC ASSISTED TOTAL HYSTERECTOMY WITH BILATERAL SALPINGO  OOPHORECTOMY;  Surgeon: Adolphus Birchwood, MD;  Location: [facility];  Service: Gynecology;  Laterality: N/A;   TRANSTHORACIC ECHOCARDIOGRAM  05-19-2006   lvsf normal, ef 55-65%, there was mild flattening of the interventricular septum during diastoli/  RV size at upper limits normal   TUBAL LIGATION     VULVA /PERINEUM BIOPSY N/A 05/18/2021   Procedure: VULVAR BIOPSY;  Surgeon: Adolphus Birchwood, MD;  Location: [facility];  Service: Gynecology;  Laterality: N/A;   WISDOM TOOTH EXTRACTION  age 64 's   Patient Active Problem List   Diagnosis Date Noted   Prolonged QT interval 04/10/2023   Dizziness 04/10/2023   Seizure (HCC) 03/31/2023   Respiratory depression 03/31/2023   Status epilepticus (HCC) 03/31/2023   Migraine with aura and without status migrainosus, not intractable 10/03/2022   Pain in right knee 09/07/2022   Bilateral primary osteoarthritis of knee 09/07/2022   Chronic migraine without aura without status migrainosus, not intractable 07/07/2022   Graves disease 05/10/2022   Sebaceous cyst 12/06/2021   Pain in left knee 11/03/2021   Hyperthyroidism 08/13/2021   Bipolar 1 disorder (HCC) 05/07/2021   Obesity (BMI 30.0-34.9) 05/07/2021   Hyperlipidemia 08/12/2020   Prediabetes 08/12/2020   Hypomagnesemia 08/12/2020   Mutation in BRIP1 gene 06/24/2020   Hypokalemia 06/17/2020   Dehydration    Colitis 06/16/2020   Port-A-Cath in place 03/11/2020   Vulvar cancer (HCC) 01/29/2020   Vulvar ulceration 01/23/2020   Essential hypertension 10/31/2019   Nausea and vomiting 12/27/2018   Seasonal allergic rhinitis 12/27/2018   History of ELISA positive for HSV 10/01/2018   Restless leg syndrome 04/01/2016   Nephrolithiasis 03/08/2016   Obstructive pyelonephritis 03/08/2016   Normocytic anemia 03/08/2016   Hidradenitis suppurativa of left axilla 11/03/2015   Loss of weight 09/08/2015   Neck pain on left side 10/22/2014   Genital warts 05/20/2014   GERD (gastroesophageal reflux disease) 12/26/2012    Functional dyspepsia 12/26/2012   Tobacco abuse 08/22/2012   Herpes simplex virus (HSV) infection 02/03/2007    PCP: Marcine Matar, MD  REFERRING PROVIDER: Marcine Matar, MD  REFERRING DIAG: Weakness [R53.1], Gait disturbance [R26.9]   THERAPY DIAG:  Muscle weakness (generalized)  Unsteadiness on feet  Other abnormalities of gait and mobility  Rationale for Evaluation and Treatment: Rehabilitation  ONSET DATE: 03/31/23  SUBJECTIVE:   SUBJECTIVE STATEMENT:  Patient said she had about 5 seizures around the 03/31/23. She was hospitalized at that time. Pt's chart also indicates possible syncopal episode from hypotension/dehydration, and rhabdomyolysis secondary to seizures while hospitalized. Since then she's had challenges with her memory. She does not recall having any falls this year. 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   Patient in visible distress related to LEFT shoulder pain following seizures that she was referred to [facility] for evaluation.    Appointment with neurology on the 21st of this month.  Currently ambulating with partial rolling walker.  Legs feel weak.  Referred to physical therapy post discharge but no call as yet.  PERTINENT HISTORY: NO BP OR IV IN LEFT UE d/t Lymphedema   PMHx includes Arthritis, Bipolar 1, Vulva and Breast Cancer (currently in remission), Depression, GAD, Graves Disease, Anemia, OCD, pre-diabetes, PTSD, restless leg syndrome, seizure, chronic migraine    PAIN:  Are you having pain? Yes, Rt shoulder; No back and LE pain  PRECAUTIONS: Other: Lymphedema Alert - NO BP on LEFT ARM    WEIGHT BEARING RESTRICTIONS: No  FALLS:  Has patient fallen in last 6 months?  "Not that I recall"  LIVING ENVIRONMENT: Lives with: lives with their family Lives in:  House/apartment Stairs: No Has following equipment at home: Walker - 4 wheeled, bed side commode, and Grab bars  OCCUPATION: CNA  PLOF: Independent  PATIENT GOALS: "I want to be able to go back to work."   NEXT MD VISIT: 04/26/23 Neurologist, 05/02/23 Orthopedic   OBJECTIVE:   DIAGNOSTIC FINDINGS:   04/05/23: HIP UNILAT W or WO Pelvis 2-3 views IMPRESSION: Mild-to-moderate right and mild left femoroacetabular osteoarthritis.  PATIENT SURVEYS:  FOTO 39 current, 56 predicted   COGNITION: Overall cognitive status: Impaired - related to recent changes with memory as noted in subjective assessment      SENSATION: Not tested  POSTURE: rounded shoulders and posterior pelvic tilt  LOWER EXTREMITY MMT:  MMT Right eval Left eval  Hip flexion 3+ 3+  Hip extension 4- 4-  Hip abduction 4- 4-  Hip adduction    Hip internal rotation 3+ 4-  Hip external rotation 3+ 4-  Knee flexion 3+ 4-  Knee extension 3+ 3+  Ankle dorsiflexion 4+ 4+  Ankle plantarflexion    Ankle inversion    Ankle eversion     (Blank rows = not tested)   FUNCTIONAL TESTS:  Timed up and go (TUG): TBD  Balance    Romberg EO on floor  30 sec, Min sway  Romberg EC on floor 30 sec, min sway  Romberg EO on airex 30 sec, mod sway  Romberg EC on airex  30 sec, mod sway     Balance Right Left   Tandem on floor 30 sec 30 sec  SLS on floor  11 sec 7 sec    GAIT: Distance walked: 50 feet Assistive device utilized: None Level of assistance: SBA Comments: slow gait speed - BIL small step length with decreased stance time    TODAY'S TREATMENT:                                                                                                                               [facility] Adult PT Treatment:  DATE: 04/25/2023   Initial evaluation: see patient education and home exercise program as noted below     PATIENT EDUCATION:  Education details: reviewed initial  home exercise program; discussion of POC, prognosis and goals for skilled PT   Person educated: Patient Education method: Medical illustrator Education comprehension: verbalized understanding, returned demonstration, and needs further education  HOME EXERCISE PROGRAM: Instructed to obtain and practice walking with SPC in Rt hand. Use of RW as needed.   ASSESSMENT:  CLINICAL IMPRESSION: Patient is a 47 y.o. female  who was seen today for physical therapy evaluation and treatment for LE weakness and unsteady gait. She is demonstrating decreased BIL LE, decreased SLS balance, and altered gait mechanics. She has related difficulty with transfers, walking, and bathing and is unable to return to work at this time. She requires skilled PT services at this time to address relevant deficits and improve overall function.     OBJECTIVE IMPAIRMENTS: Abnormal gait, decreased activity tolerance, decreased balance, decreased endurance, decreased mobility, difficulty walking, decreased strength, impaired perceived functional ability, improper body mechanics, postural dysfunction, and pain.   ACTIVITY LIMITATIONS: carrying, lifting, standing, squatting, stairs, transfers, bed mobility, locomotion level, and caring for others  PARTICIPATION LIMITATIONS: cleaning, community activity, and occupation  PERSONAL FACTORS: Past/current experiences, Time since onset of injury/illness/exacerbation, and 3+ comorbidities: PMHx includes Arthritis, Bipolar 1, Vulva and Breast Cancer (currently in remission), Depression, GAD, Graves Disease, Anemia, OCD, pre-diabetes, PTSD, restless leg syndrome, seizure, chronic migraine   are also affecting patient's functional outcome.   REHAB POTENTIAL: Fair    CLINICAL DECISION MAKING: Evolving/moderate complexity  EVALUATION COMPLEXITY: Moderate   GOALS: Goals reviewed with patient? Yes  SHORT TERM GOALS: Target date: 05/16/2023   Patient will be independent with  initial home program for LE strengthening and balance . Baseline: to be initiated at follow up visit  Goal status: INITIAL  2.  Patient will ambulate 6 minutes with LRAD without LOB.  Baseline: requires RW, unsteady with walking >50-194ft Goal status: INITIAL   LONG TERM GOALS: Target date: 06/06/2023   Patient will report improved overall functional ability with FOTO score of 56 predicted.  Baseline: 39 current Goal status: INITIAL  2.  Patient will perform SLS at least 20 sec on each LE in order to maximize safe and independent function.  Baseline:  Balance Right Left   Tandem on floor 30 sec 30 sec  SLS on floor  11 sec 7 sec   Goal status: INITIAL  3.  Patient will demonstrate at least 4 or 4+/5 MMT with all LE strength testing.   Baseline:  MMT Right eval Left eval  Hip flexion 3+ 3+  Hip extension 4- 4-  Hip abduction 4- 4-  Hip adduction    Hip internal rotation 3+ 4-  Hip external rotation 3+ 4-  Knee flexion 3+ 4-  Knee extension 3+ 3+  Ankle dorsiflexion 4+ 4+   Goal status: INITIAL  4.  Patient will demonstrate ability to walk 6+ minutes at normal gait speed without AD or LOB.  Baseline: requires RW  Goal status: INITIAL  5.  Patient will demonstrate ability to ambulate over varied surfaces, including inclines without LOB and no more than minimal fatigue after 15 minutes of weight bearing activities.  Baseline: unable  Goal status: INITIAL    PLAN:  PT FREQUENCY: 1-2x/week  PT DURATION: 6 weeks  PLANNED INTERVENTIONS: Therapeutic exercises, Therapeutic activity, Neuromuscular re-education, Balance training, Gait training, Patient/Family education, Self Care, Joint mobilization,  Electrical stimulation, Cryotherapy, Moist heat, Manual therapy, and Re-evaluation  PLAN FOR NEXT SESSION: balance (narrow BOS, SLS, uneven surfaces), gait activities encouraging increased SLS time, reactive balance, BIL LE strengthening    Mauri Reading, PT,  DPT 04/25/2023, 11:13 AM

## 2023-04-26 ENCOUNTER — Ambulatory Visit: Payer: PRIVATE HEALTH INSURANCE | Admitting: Neurology

## 2023-04-26 ENCOUNTER — Encounter: Payer: Self-pay | Admitting: Neurology

## 2023-04-26 ENCOUNTER — Telehealth: Payer: Self-pay | Admitting: Internal Medicine

## 2023-04-26 ENCOUNTER — Other Ambulatory Visit: Payer: Self-pay | Admitting: Internal Medicine

## 2023-04-26 VITALS — BP 115/75 | HR 71 | Ht 65.0 in | Wt 183.0 lb

## 2023-04-26 DIAGNOSIS — G43709 Chronic migraine without aura, not intractable, without status migrainosus: Secondary | ICD-10-CM

## 2023-04-26 DIAGNOSIS — G43109 Migraine with aura, not intractable, without status migrainosus: Secondary | ICD-10-CM | POA: Diagnosis not present

## 2023-04-26 DIAGNOSIS — R569 Unspecified convulsions: Secondary | ICD-10-CM | POA: Diagnosis not present

## 2023-04-26 NOTE — Telephone Encounter (Signed)
Phone call made to patient today.  I left her message informing that I am trying to fill out her form for short-term disability.  I need to know when was the last day that she went to work.  I will also send her a MyChart message.

## 2023-04-26 NOTE — Progress Notes (Addendum)
[facility]    Provider:  Dr Sandra Miller Requesting Provider: Marcine Matar, MD Primary Care Provider:  Marcine Matar, MD  CC:  headaches, recent provoked seizure  04/26/2023: Migraine patient who is here for follow up from hospital. has Herpes simplex virus (HSV) infection; Tobacco abuse; GERD (gastroesophageal reflux disease); Functional dyspepsia; Genital warts; Neck pain on left side; Loss of weight; Hidradenitis suppurativa of left axilla; Nephrolithiasis; Obstructive pyelonephritis; Normocytic anemia; Restless leg syndrome; History of ELISA positive for HSV; Nausea and vomiting; Seasonal allergic rhinitis; Essential hypertension; Vulvar ulceration; Vulvar cancer (HCC); Port-A-Cath in place; Colitis; Hypokalemia; Dehydration; Mutation in BRIP1 gene; Hyperlipidemia; Prediabetes; Hypomagnesemia; Bipolar 1 disorder (HCC); Obesity (BMI 30.0-34.9); Hyperthyroidism; Pain in left knee; Sebaceous cyst; Chronic migraine without aura without status migrainosus, not intractable; Pain in right knee; Bilateral primary osteoarthritis of knee; Migraine with aura and without status migrainosus, not intractable; Provoked seizures (HCC); Respiratory depression; Status epilepticus (HCC); Prolonged QT interval; Graves disease; and Dizziness on their problem list.  She has a hx of bipolar 1, cancer s/p chemo, radiation therapy.  Tobacco abuse, depression, anxiety, migraines, panic attacks, PTSD, obesity, gastric ulcer, Graves' disease, nephrolithiasis, hypertension, prediabetes was seen in the hospital for possible seizures.  I reviewed discharge summaries from Dr. Randol Miller and Dr. Dayna Miller: She was found minimally responsive on the floor by her daughter on March 31, 2019 4 in the morning, patient had had intermittent nausea and vomiting but worse over the last week with poor p.o. intake, and worsening of her chronic headaches migraines.  No previous history of seizures, witnessed seizures and 2  additional seizures with EMS.  In the ED had rightward gaze and remain minimally responsible with an additional 2 seizures in the ER.  She was loaded with Keppra and treated with Ativan labs were significant for very elevated white blood cells 25.8, potassium 3.4, magnesium 0.8, Phos 5.4, calcium 10.6, glucose 316, she was admitted and intubated.  MRI was negative for any acute processes, negative CSF, overnight EEG showed mild to moderate diffuse encephalopathy no seizure or epileptiform discharges seen in long-term EEG was discontinued on July 27.  Diagnosed with seizure versus syncopal seizure from hypotension from dehydration from frequent nausea, vomiting and poor oral air intake and abnormalities of electrolytes with extremely elevated white blood cell count, hypomagnesemia and hypokalemia.  Possibly provoked seizure.  She was seen again several days later 04/10/2023 in the hospital she had a prolonged QT interval, likely due to severe electrolyte abnormality and her Zyprexa was discontinued.  She had profound hypomagnesemia and hypokalemia.  Her thyroid was normal.  Her migraines were stable.  Hospitalization was for questionable seizure versus syncope, she was admitted and seen by neurology, EEG was inconclusive, MRI of the brain was nonacute, was placed on Keppra 750 mg p.o. twice daily.    I reviewed Dr. Henriette Miller recent notes, patient with recurrent vomiting, going on for months, smoking marijuana 3 to 4 months ago, no new meds that she knew that could be causing it, has follow-up with GI 06/02/2023.  She was also referred to nephrology.  She also sees Dr. Lolly Miller and psychiatry.  She still has electrolyte abnormalities as of 818 and her magnesium came back low at 0.6 despite taking mag oxide.  Dr. Laural Miller suspected it was high doses of omeprazole.  She is here with sister. The Ajovy was not approved. She has not taken Ajovy, we will hold off on Ajovy. And the Ubrelvy. It has been months since she took  the Ajovy. No seizures since July. Don't think she should drive until she has been stabilized medically, likely provoked seizure due to electrolyte imbalances, dehydration, vomiting, nausea, diarrhead. Continue the Keppra and no driving but once medical problems are stabilized we can order an eeg in the office and it negative I think she could drive again and keep her on keppra for 6 months. 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 When she had chemo she was able to work. She was started on lamictal and I will contact Dr. Lolly Miller to ask if we can titrate to 200 bid and then decrease and titrate off the keppra. I warned her about the keppra and causing irritability. Her sister is very concerned about her mental health. She is taking magnesium and vitamin D and following closely with pcp, referrals to gastro and nephro. Lamictal and keppra and magnesium are used for migraines, at this time will hold off on anything and monitor migraines.  Patient complains of symptoms per HPI as well as the following symptoms: seizure . Pertinent negatives and positives per HPI. All others negative   10/03/2022: Patient was started on Ajovy. MRi showed partially empty sella which could have been incidental, no papilledema seen on exam. Here for follow up. Sandra Miller has worked tremendously well, 100%, last few been sick and she has had some migraines but otherwise the Ajovy has been tremendous now she has flu-like symptoms. Likely illness that's causing the headaches. Ajovy has helped tremendously. She has ubrelvy as needed. We will prescribe and follow up in one year. No new brain imaging in epic review.  Patient complains of symptoms per HPI as well as the following symptoms: flu . Pertinent negatives and positives per HPI. All others negative  Reviewed labs:     Latest Ref Rng & Units 04/21/2023    2:00 PM 04/14/2023   12:07 PM 04/12/2023   12:46 AM  CMP  Glucose 70 - 99 mg/dL  90  409   BUN  6 - 24 mg/dL  7  6   Creatinine 8.11 - 1.00 mg/dL  9.14  7.82   Sodium 956 - 144 mmol/L  140  136   Potassium 3.5 - 5.2 mmol/L  4.4  3.4   Chloride 96 - 106 mmol/L  100  101   CO2 20 - 29 mmol/L  19  22   Calcium 8.7 - 10.2 mg/dL 21.3  08.6  9.2   Total Protein 6.5 - 8.1 g/dL   6.8   Total Bilirubin 0.3 - 1.2 mg/dL   0.9   Alkaline Phos 38 - 126 U/L   45   AST 15 - 41 U/L   28   ALT 0 - 44 U/L   39       Latest Ref Rng & Units 04/12/2023   12:46 AM 04/11/2023    7:15 AM 04/10/2023    9:40 AM  CBC  WBC 4.0 - 10.5 K/uL 9.8  8.9  13.9   Hemoglobin 12.0 - 15.0 g/dL 57.8  46.9  62.9   Hematocrit 36.0 - 46.0 % 38.0  39.9  37.4   Platelets 150 - 400 K/uL 421  381  352       HPI:  Sandra Miller is a 47 y.o. female here as requested by Sandra Matar, MD for headaches. She has had a headache for a week ago. Migraines started years ago. PMHx bipolar, cancer, depression, gad,  graves, kidney stones, htn, panic attacks, radiation therapy, ocd, pre-diates, ocd, PTSD, RLS, recovering alcoholic, breast and vulvar cancer, Pulsating/pounding/throbbing,photo/phonophobia, nausea, hurts to move, nausea, sister has migraines and daughter. Whole head, no hearing changes, sleep helps, can radiate to the back of the head. Vision is blurry all the time. She doesn't see a difference with glasses. Daily headaches. At least 10 migraine days a month that can last 24 hours and be moderate to severe although has had 4 days of migraines in a row. No aura. No other focal neurologic deficits, associated symptoms, inciting events or modifiable factors.   Reviewed notes, labs and imaging from outside physicians, which showed:  06/28/2022 tsh and ft4 nml  From a thorough review of records meds tried: Tylenol, baclofen,voltaren, benadryl, prozac, gabapentin, ibupren, ketoralc, labetalol, lisinopril, magnesium, methimazole,robaxin, reglan, medrol, zyprexa, prednisone, compazine, propranolol,has been on topamx also   topiramate contraindicated due to kidney stones, trazodone, effexor,amitriptyline and nortrityline, imitrex and maxalt(palpitations), aimovig contraindicated due to constipation, Ajovy(worked but insurance problems), ubrelvy  TECHNIQUE: Multidetector CT imaging of the head was performed using the standard protocol during bolus administration of intravenous contrast. Multiplanar CT image reconstructions and MIPs were obtained to evaluate the vascular anatomy.   RADIATION DOSE REDUCTION: This exam was performed according to the departmental dose-optimization program which includes automated exposure control, adjustment of the mA and/or kV according to patient size and/or use of iterative reconstruction technique.   CONTRAST:  75mL OMNIPAQUE IOHEXOL 350 MG/ML SOLN   COMPARISON:  None Available.   FINDINGS: CT HEAD   Brain: Cerebral volume within normal limits for patient age.   No evidence for acute intracranial hemorrhage. No findings to suggest acute large vessel territory infarct. No mass lesion, midline shift, or mass effect. Ventricles are normal in size without evidence for hydrocephalus. No extra-axial fluid collection identified. Partially empty sella noted.   Vascular: No hyperdense vessel identified.   Skull: Scalp soft tissues demonstrate no acute abnormality. Calvarium intact.   Sinuses/Orbits: Globes and orbital soft tissues within normal limits.   Minimal ethmoidal sinus disease. Paranasal sinuses are otherwise clear. No mastoid effusion.   CTA HEAD   Anterior circulation: Both internal carotid arteries widely patent to the termini without stenosis. A1 segments widely patent. Normal anterior communicating artery complex. Both anterior cerebral arteries widely patent to their distal aspects without stenosis. No M1 stenosis or occlusion. Normal MCA bifurcations. Distal MCA branches well perfused and symmetric.   Posterior circulation: Both V4 segments patent  to the vertebrobasilar junction without stenosis. Both PICA origins patent and normal. Basilar widely patent to its distal aspect without stenosis. Superior cerebellar arteries patent bilaterally. Both PCAs primarily supplied via the basilar and are well perfused to there distal aspects.   Venous sinuses: Patent allowing for timing the contrast bolus.   Anatomic variants: None significant.  No intracranial aneurysm.   Review of the MIP images confirms the above findings.   CT VENOGRAM   Normal enhancement seen throughout the superior sagittal sinus to the torcula. Transverse and sigmoid sinuses are patent as are the jugular bulbs and visualized proximal internal jugular veins. Right transverse sinus dominant. Straight sinus, vein of Galen, and internal cerebral veins are patent. No visible cortical vein thrombosis. No dural sinus thrombosis.   IMPRESSION: 1. Normal CTA of the head. No large vessel occlusion, hemodynamically significant stenosis, or other acute vascular abnormality. No aneurysm. 2. Negative CT venogram.  Evidence for dural sinus thrombosis. 3. No other acute intracranial abnormality. 4. Partially empty sella.  Finding is nonspecific, but can be seen in the setting of idiopathic intracranial hypertension.  Review of Systems: Patient complains of symptoms per HPI as well as the following symptoms migraines. Pertinent negatives and positives per HPI. All others negative.   Social History   Socioeconomic History   Marital status: Single    Spouse name: Not on file   Number of children: Not on file   Years of education: Not on file   Highest education level: Not on file  Occupational History   Not on file  Tobacco Use   Smoking status: Every Day    Current packs/day: 0.50    Average packs/day: 0.5 packs/day for 22.0 years (11.0 ttl pk-yrs)    Types: Cigarettes   Smokeless tobacco: Never   Tobacco comments:    Recently started a smoking cessation class.    Vaping Use   Vaping status: Never Used  Substance and Sexual Activity   Alcohol use: Not Currently   Drug use: Yes    Types: Marijuana   Sexual activity: Yes    Partners: Male    Birth control/protection: Surgical  Other Topics Concern   Not on file  Social History Narrative   Former [facility] patient.      Was on disability at one point.   Return to the workforce.  40 hours a week at [employer], 10 hours a week on the weekends at [employer] in [place], [employer] at night 10 to 12 hours a week.      Has grown children, she lives alone with a pet, continues to smoke no alcohol or drug use at this time      History of EtOH abuse and THC use.         Social Determinants of Health   Financial Resource Strain: Medium Risk (01/24/2022)   Received from [facility], [facility]   Overall Financial Resource Strain (CARDIA)    Difficulty of Paying Living Expenses: Somewhat hard  Food Insecurity: No Food Insecurity (04/10/2023)   Hunger Vital Sign    Worried About Running Out of Food in the Last Year: Never true    Ran Out of Food in the Last Year: Never true  Transportation Needs: No Transportation Needs (04/10/2023)   PRAPARE - Administrator, Civil Service (Medical): No    Lack of Transportation (Non-Medical): No  Physical Activity: Inactive (01/24/2022)   Received from [facility], [facility]   Exercise Vital Sign    Days of Exercise per Week: 0 days    Minutes of Exercise per Session: 0 min  Stress: Stress Concern Present (01/24/2022)   Received from [facility], [facility] of Occupational Health - Occupational Stress Questionnaire    Feeling of Stress : Very much  Social Connections: Unknown (02/12/2023)   Received from [facility], [facility]   Social Network    Social Network: Not on file  Intimate Partner Violence: Not At Risk (04/10/2023)   Humiliation, Afraid, Rape, and Kick questionnaire    Fear of Current or  Ex-Partner: No    Emotionally Abused: No    Physically Abused: No    Sexually Abused: No    Family History  Problem Relation Age of Onset   Heart disease Father    Lung cancer Father        d. 9   Alcohol abuse Father    Heart disease Mother    Depression Mother    Anxiety disorder  Mother    Drug abuse Brother    Alcohol abuse Brother    Drug abuse Brother    ADD / ADHD Brother    Colon polyps Brother    Cancer Paternal Grandfather        "stomach"   Diabetes Maternal Grandfather    Diabetes Paternal Grandmother    Kidney disease Maternal Uncle    Cirrhosis Cousin        alcoholic   Anxiety disorder Maternal Aunt    Depression Maternal Aunt    Cancer Cousin        maternal; ovarian cancer or other "female" cancer?   Lung cancer Paternal Uncle 50   Throat cancer Cousin        paternal; dx 55s   Lung cancer Cousin        paternal; dx 39s    Past Medical History:  Diagnosis Date   Arthritis    Bipolar 1 disorder (HCC)    Cancer (HCC)    vulva, and breast   Complication of anesthesia    wakes up during procedures   Depression    GAD (generalized anxiety disorder)    Genital HSV    currently per pt  no break out 03-22-2016    GERD (gastroesophageal reflux disease)    Graves disease    Hiatal hernia    History of cervical dysplasia    2012 laser ablation   History of esophageal dilatation    for dysphasia -- x2 dilated   History of gastric ulcer    History of Helicobacter pylori infection    remote hx   History of hidradenitis suppurativa    "gets all over body intermittantly"     History of hypertension    no issue since stopped drinking alcohol 2014   History of kidney stones    History of panic attacks    History of radiation therapy 03/16/2020-05/08/2020   vulva  Dr Antony Blackbird   History of radiation therapy 09/09/2020-10/23/2020   left chest wall/left SCV   Dr Antony Blackbird   Hypertension    Iron deficiency anemia    Left ureteral stone    OCD  (obsessive compulsive disorder)    PONV (postoperative nausea and vomiting)    Pre-diabetes    PTSD (post-traumatic stress disorder)    Recovering alcoholic in remission (HCC)    since 2014   RLS (restless legs syndrome)    Smokers' cough (HCC)    Thyroid disease    Urgency of urination    Yeast infection involving the vagina and surrounding area    secondary to taking antibiotic    Patient Active Problem List   Diagnosis Date Noted   Prolonged QT interval 04/10/2023   Dizziness 04/10/2023   Provoked seizures (HCC) 03/31/2023   Respiratory depression 03/31/2023   Status epilepticus (HCC) 03/31/2023   Migraine with aura and without status migrainosus, not intractable 10/03/2022   Pain in right knee 09/07/2022   Bilateral primary osteoarthritis of knee 09/07/2022   Chronic migraine without aura without status migrainosus, not intractable 07/07/2022   Graves disease 05/10/2022   Sebaceous cyst 12/06/2021   Pain in left knee 11/03/2021   Hyperthyroidism 08/13/2021   Bipolar 1 disorder (HCC) 05/07/2021   Obesity (BMI 30.0-34.9) 05/07/2021   Hyperlipidemia 08/12/2020   Prediabetes 08/12/2020   Hypomagnesemia 08/12/2020   Mutation in BRIP1 gene 06/24/2020   Hypokalemia 06/17/2020   Dehydration    Colitis 06/16/2020   Port-A-Cath in place 03/11/2020  Vulvar cancer (HCC) 01/29/2020   Vulvar ulceration 01/23/2020   Essential hypertension 10/31/2019   Nausea and vomiting 12/27/2018   Seasonal allergic rhinitis 12/27/2018   History of ELISA positive for HSV 10/01/2018   Restless leg syndrome 04/01/2016   Nephrolithiasis 03/08/2016   Obstructive pyelonephritis 03/08/2016   Normocytic anemia 03/08/2016   Hidradenitis suppurativa of left axilla 11/03/2015   Loss of weight 09/08/2015   Neck pain on left side 10/22/2014   Genital warts 05/20/2014   GERD (gastroesophageal reflux disease) 12/26/2012   Functional dyspepsia 12/26/2012   Tobacco abuse 08/22/2012   Herpes simplex  virus (HSV) infection 02/03/2007    Past Surgical History:  Procedure Laterality Date   CESAREAN SECTION  1995   w/  Bilateral Tubal Ligation   COLONOSCOPY  last one 08-09-2013   CYSTOSCOPY W/ URETERAL STENT PLACEMENT Left 03/29/2016   Procedure: CYSTOSCOPY WITH STENT REPLACEMENT;  Surgeon: Hildred Laser, MD;  [facility];  Service: Urology;  Laterality: Left;   CYSTOSCOPY WITH RETROGRADE PYELOGRAM, URETEROSCOPY AND STENT PLACEMENT Left 03/08/2016   Procedure: CYSTOSCOPY WITH  LEFT RETROGRADE PYELOGRAM, AND STENT PLACEMENT;  Surgeon: Hildred Laser, MD;  Location: [facility];  Service: Urology;  Laterality: Left;   CYSTOSCOPY/RETROGRADE/URETEROSCOPY/STONE EXTRACTION WITH BASKET Left 03/29/2016   Procedure: CYSTOSCOPY/RETROGRADE/URETEROSCOPY/STONE EXTRACTION WITH BASKET;  Surgeon: Hildred Laser, MD;  Location: [facility];  Service: Urology;  Laterality: Left;   ENDOMETRIAL ABLATION W/ NOVASURE  04-01-2010   ESOPHAGOGASTRODUODENOSCOPY  last one 08-09-2013   KNEE ARTHROSCOPY Left as teen   LASER ABLATION OF THE CERVIX  2012 approx   MASTECTOMY WITH AXILLARY LYMPH NODE DISSECTION Left 07/27/2020   Procedure: LEFT MASTECTOMY WITH LEFT RADIOACTIVE SEED GUIDED TARGETED AXILLARY LYMPH NODE DISSECTION;  Surgeon: Ovidio Kin, MD;  Location: [facility];  Service: General;  Laterality: Left;   PORT-A-CATH REMOVAL Right 05/18/2021   Procedure: REMOVAL PORT-A-CATH;  Surgeon: Manus Rudd, MD;  Location: [facility];  Service: General;  Laterality: Right;   PORTACATH PLACEMENT Right 03/06/2020   Procedure: INSERTION PORT-A-CATH WITH ULTRASOUND GUIDANCE;  Surgeon: Ovidio Kin, MD;  Location: [facility];  Service: General;  Laterality: Right;   ROBOTIC ASSISTED TOTAL HYSTERECTOMY WITH BILATERAL SALPINGO OOPHERECTOMY N/A 05/18/2021   Procedure: XI ROBOTIC ASSISTED TOTAL HYSTERECTOMY WITH BILATERAL SALPINGO OOPHORECTOMY;  Surgeon: Adolphus Birchwood, MD;   Location: [facility];  Service: Gynecology;  Laterality: N/A;   TRANSTHORACIC ECHOCARDIOGRAM  05-19-2006   lvsf normal, ef 55-65%, there was mild flattening of the interventricular septum during diastoli/  RV size at upper limits normal   TUBAL LIGATION     VULVA /PERINEUM BIOPSY N/A 05/18/2021   Procedure: VULVAR BIOPSY;  Surgeon: Adolphus Birchwood, MD;  Location: [facility];  Service: Gynecology;  Laterality: N/A;   WISDOM TOOTH EXTRACTION  age 21 's    Current Outpatient Medications  Medication Sig Dispense Refill   diclofenac Sodium (VOLTAREN) 1 % GEL Apply 4 grams topically 4  times daily as needed. (Patient taking differently: Apply 4 g topically 4 (four) times daily as needed (pain).) 500 g 6   FLUoxetine (PROZAC) 10 MG capsule Take 3 capsules (30 mg total) by mouth daily. (Patient taking differently: Take 30 mg by mouth 3 (three) times daily.) 90 capsule 2   gabapentin (NEURONTIN) 300 MG capsule Take 1 capsule (300 mg) by mouth daily. 30 capsule 1   lamoTRIgine (LAMICTAL) 25 MG tablet Take 1 tablet (25 mg total) daily for 7 days. After 7 days,  take 2 tablets (50 mg total) daily. 53 tablet 0   letrozole (FEMARA) 2.5 MG tablet Take 1 tablet by mouth daily. 90 tablet 3   levETIRAcetam (KEPPRA) 750 MG tablet Take 1 tablet (750 mg total) by mouth 2 (two) times daily. 180 tablet 3   lisinopril (ZESTRIL) 10 MG tablet Take 1 tablet (10 mg total) by mouth daily. Stop the Lisinopril/HCTZ combination 90 tablet 1   loratadine (CLARITIN REDITABS) 10 MG dissolvable tablet Take 10 mg by mouth daily. OTC     LORazepam (ATIVAN) 1 MG tablet Take 1 tablet (1 mg total) by mouth 3 (three) times daily as needed for anxiety (Patient taking differently: Take 1 mg by mouth 3 (three) times daily as needed for anxiety or seizure.) 90 tablet 1   Magnesium 400 MG TABS Take 800 mg by mouth 2 (two) times daily. 120 tablet 1   methimazole (TAPAZOLE) 5 MG tablet Take one tablet (5 mg dose) by mouth daily. (Patient taking differently:  Take 5 mg by mouth daily.) 90 tablet 3   Multiple Vitamin (MULTIVITAMIN WITH MINERALS) TABS tablet Take 1 tablet by mouth daily.     Multiple Vitamins-Minerals (CERTA-VITE PO) Take by mouth daily. OTC     omeprazole (PRILOSEC) 40 MG capsule Take 1 capsule (40 mg) by mouth in the morning and at bedtime. 60 capsule 1   potassium chloride SA (KLOR-CON M) 20 MEQ tablet Take 1 tablet (20 mEq) by mouth daily. 90 tablet 1   propranolol (INDERAL) 10 MG tablet Take 1 tablet by mouth 2 times daily. 180 tablet 0   senna-docusate (SENOKOT-S) 8.6-50 MG tablet Take 2 tablets by mouth at bedtime. For AFTER surgery only, do not take if having diarrhea 30 tablet 0   spironolactone (ALDACTONE) 25 MG tablet Take 0.5 tablets (12.5 mg total) by mouth daily. 60 tablet 1   levETIRAcetam (KEPPRA) 750 MG tablet Take 1 tablet (750 mg total) by mouth 2 (two) times daily. 60 tablet 0   No current facility-administered medications for this visit.    Allergies as of 04/26/2023 - Review Complete 04/26/2023  Allergen Reaction Noted   Dilaudid [hydromorphone hcl] Other (See Comments) 06/18/2020   Aspirin Hives    Depakote [divalproex sodium] Nausea And Vomiting 11/01/2016   Minocycline Hives 09/23/2011    Vitals: BP 115/75   Pulse 71   Ht 5\' 5"  (1.651 m)   Wt 183 lb (83 kg)   BMI 30.45 kg/m  Last Weight:  Wt Readings from Last 1 Encounters:  04/26/23 183 lb (83 kg)   Last Height:   Ht Readings from Last 1 Encounters:  04/26/23 5\' 5"  (1.651 m)  Physical exam: Exam: Gen: NAD, conversant, well nourised, obese, well groomed                     CV: RRR, no MRG. No Carotid Bruits. No peripheral edema, warm, nontender Eyes: Conjunctivae clear without exudates or hemorrhage  Neuro: Detailed Neurologic Exam  Speech:    Speech is normal; fluent and spontaneous with normal comprehension.  Cognition:    The patient is oriented to person, place, and time;     recent and remote memory intact;     language fluent;      normal attention, concentration,     fund of knowledge Cranial Nerves:    The pupils are equal, round, and reactive to light. The fundi are normal and spontaneous venous pulsations are present. Visual fields are full to finger confrontation.  Extraocular movements are intact. Trigeminal sensation is intact and the muscles of mastication are normal. The face is symmetric. The palate elevates in the midline. Hearing intact. Voice is normal. Shoulder shrug is normal. The tongue has normal motion without fasciculations.   Coordination:  nml  Gait: nml  Motor Observation:    No asymmetry, no atrophy, and no involuntary movements noted. Tone:    Normal muscle tone.    Posture:    Posture is normal. normal erect    Strength:    Strength is V/V in the upper and lower limbs.      Sensation: intact to LT     Reflex Exam:  DTR's:    Deep tendon reflexes in the upper and lower extremities are symmetrical bilaterally.   Toes:    The toes are downgoing bilaterally.   Clonus:    Clonus is absent.          Assessment/Plan:  Patient with likely provoked seizure due to electrolyte imbalances, dehydration, vomiting, nausea, diarrhea. Continue the Keppra 6 months(or maybe switch as Dr Sandra Miller placed her on Lamictal) and no driving 6 months but once medical problems are stabilized we can order an eeg in the office and it negative I think she may be able to drive again, will have to evaluate then for now no driving 6 months and keep her on keppra for 6 months.  Continue Keppra for 6 months. I do think these were provoked due to medical conditions. When all electrolyte/medical imbalances are stabilized we can perform another eeg and then discuss slowly decreasing keppra  Per [facility] statutes, patients with seizures are not allowed to drive until they have been seizure-free for six months. Discussed seizure safety. Sister provided much information  She is here with sister. It has  been months since she took the Ajovy. No seizures since July.  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 When she had chemo she was able to work. She was started on lamictal and I will contact Dr. Lolly Miller to ask if we can titrate to 200 bid and then decrease and titrate off the keppra. I warned her about the keppra and causing irritability. Her sister is very concerned about her mental health. She is taking magnesium and vitamin D and following closely with pcp, referrals to gastro and nephro. Lamictal and keppra and magnesium are used for migraines, at this time will hold off on migraine treatment and monitor migraines.  Sent Dr. Lolly Miller a message, if he can titrate her to lamictal 100mg  bid I can take her off of the keppra. Will await response.  Was doing well on Ajovy and ubrelvy. Not on it now. MRi showed partially empty sella which could have been incidental, no papilledema seen on exam.    No orders of the defined types were placed in this encounter.  Meds ordered this encounter  Medications   levETIRAcetam (KEPPRA) 750 MG tablet    Sig: Take 1 tablet (750 mg total) by mouth 2 (two) times daily.    Dispense:  180 tablet    Refill:  3    Cc: Sandra Matar, MD,  Sandra Matar, MD  Naomie Dean, MD  [facility] [address] [place], [place] [address]  Phone [phone] Fax [phone]  I spent over 45 minutes of face-to-face and non-face-to-face time with patient on the  1. Provoked seizures (HCC)   2. Migraine with  aura and without status migrainosus, not intractable   3. Chronic migraine without aura without status migrainosus, not intractable    diagnosis.  This included previsit chart review, lab review, study review, order entry, electronic health record documentation, patient education on the different diagnostic and therapeutic options, counseling and coordination of care, risks and benefits of  management, compliance, or risk factor reduction

## 2023-04-27 ENCOUNTER — Telehealth: Payer: Self-pay | Admitting: Internal Medicine

## 2023-04-27 NOTE — Telephone Encounter (Signed)
I left form on your desk for this patient.  I completed form for [employer] for short-term disability.  Please call patient and get her Social Security number and put it on the form before faxing.  I have printed several reports that must be faxed with the form including report for MRI and CAT scan of the brain, CAT scan of the abdomen, labs and EEG report.  Once you have faxed, please let patient know that she can pick up a copy of the form for her records and keep a copy for our records.

## 2023-04-28 NOTE — Telephone Encounter (Signed)
Called & spoke to the patient. Verified name & DOB. Patient's social security number has been added to the forms. Forms have been successfully faxed to [facility] on 04/28/2023. Informed patient that forms are ready for pick-up. Patient expressed verbal understanding of all discussed.

## 2023-04-29 ENCOUNTER — Other Ambulatory Visit: Payer: Self-pay | Admitting: Neurology

## 2023-04-29 ENCOUNTER — Other Ambulatory Visit (HOSPITAL_COMMUNITY): Payer: Self-pay

## 2023-05-01 ENCOUNTER — Encounter: Payer: Self-pay | Admitting: Neurology

## 2023-05-01 ENCOUNTER — Encounter: Payer: Self-pay | Admitting: Internal Medicine

## 2023-05-01 ENCOUNTER — Other Ambulatory Visit: Payer: Self-pay

## 2023-05-01 ENCOUNTER — Ambulatory Visit: Payer: PRIVATE HEALTH INSURANCE | Attending: Internal Medicine | Admitting: *Deleted

## 2023-05-01 ENCOUNTER — Other Ambulatory Visit (HOSPITAL_COMMUNITY): Payer: Self-pay

## 2023-05-01 DIAGNOSIS — R9431 Abnormal electrocardiogram [ECG] [EKG]: Secondary | ICD-10-CM | POA: Diagnosis present

## 2023-05-01 MED ORDER — LEVETIRACETAM 750 MG PO TABS
750.0000 mg | ORAL_TABLET | Freq: Two times a day (BID) | ORAL | 3 refills | Status: DC
Start: 2023-05-01 — End: 2023-08-22
  Filled 2023-05-01 – 2023-06-30 (×2): qty 60, 30d supply, fill #0
  Filled 2023-07-30: qty 60, 30d supply, fill #1

## 2023-05-01 MED ORDER — LEVETIRACETAM 750 MG PO TABS
750.0000 mg | ORAL_TABLET | Freq: Two times a day (BID) | ORAL | 0 refills | Status: DC
  Filled 2023-05-01: qty 60, 30d supply, fill #0

## 2023-05-01 NOTE — Addendum Note (Signed)
Addended by: Naomie Dean B on: 05/01/2023 04:17 PM   Modules accepted: Orders

## 2023-05-01 NOTE — Progress Notes (Signed)
Patient came to [facility] for EKG for medication clearance for a psych medication. Will route nurse visit to PCP.   Patient denies SOB, Chest pain, or dizziness.

## 2023-05-01 NOTE — Patient Instructions (Signed)
Continue Keppra for 6 months When all electrolyte imbalances are stabilized we can perform another eeg and then discuss slowly decreasing keppra  Per [facility] statutes, patients with seizures are not allowed to drive until they have been seizure-free for six months.    Use caution when using heavy equipment or power tools. Avoid working on ladders or at heights. Take showers instead of baths. Ensure the water temperature is not too high on the home water heater. Do not go swimming alone. Do not lock yourself in a room alone (i.e. bathroom). When caring for infants or small children, sit down when holding, feeding, or changing them to minimize risk of injury to the child in the event you have a seizure. Maintain good sleep hygiene. Avoid alcohol.    If patient has another seizure, call 911 and bring them back to the ED if: A.  The seizure lasts longer than 5 minutes.      B.  The patient doesn't wake shortly after the seizure or has new problems such as difficulty seeing, speaking or moving following the seizure C.  The patient was injured during the seizure D.  The patient has a temperature over 102 F (39C) E.  The patient vomited during the seizure and now is having trouble breathing  Per [facility] statutes, patients with seizures are not allowed to drive until they have been seizure-free for six months.  Other recommendations include using caution when using heavy equipment or power tools. Avoid working on ladders or at heights. Take showers instead of baths.  Do not swim alone.  Ensure the water temperature is not too high on the home water heater. Do not go swimming alone. Do not lock yourself in a room alone (i.e. bathroom). When caring for infants or small children, sit down when holding, feeding, or changing them to minimize risk of injury to the child in the event you have a seizure. Maintain good sleep hygiene. Avoid alcohol.  Also recommend adequate sleep, hydration, good  diet and minimize stress.  During the Seizure  - First, ensure adequate ventilation and place patients on the floor on their left side  Loosen clothing around the neck and ensure the airway is patent. If the patient is clenching the teeth, do not force the mouth open with any object as this can cause severe damage - Remove all items from the surrounding that can be hazardous. The patient may be oblivious to what's happening and may not even know what he or she is doing. If the patient is confused and wandering, either gently guide him/her away and block access to outside areas - Reassure the individual and be comforting - Call 911. In most cases, the seizure ends before EMS arrives. However, there are cases when seizures may last over 3 to 5 minutes. Or the individual may have developed breathing difficulties or severe injuries. If a pregnant patient or a person with diabetes develops a seizure, it is prudent to call an ambulance. - Finally, if the patient does not regain full consciousness, then call EMS. Most patients will remain confused for about 45 to 90 minutes after a seizure, so you must use judgment in calling for help. - Avoid restraints but make sure the patient is in a bed with padded side rails - Place the individual in a lateral position with the neck slightly flexed; this will help the saliva drain from the mouth and prevent the tongue from falling backward - Remove all nearby  furniture and other hazards from the area - Provide verbal assurance as the individual is regaining consciousness - Provide the patient with privacy if possible - Call for help and start treatment as ordered by the caregiver   fter the Seizure (Postictal Stage)  After a seizure, most patients experience confusion, fatigue, muscle pain and/or a headache. Thus, one should permit the individual to sleep. For the next few days, reassurance is essential. Being calm and helping reorient the person is also of  importance.  Most seizures are painless and end spontaneously. Seizures are not harmful to others but can lead to complications such as stress on the lungs, brain and the heart. Individuals with prior lung problems may develop labored breathing and respiratory distress.

## 2023-05-02 ENCOUNTER — Ambulatory Visit: Payer: No Typology Code available for payment source | Admitting: Sports Medicine

## 2023-05-02 ENCOUNTER — Other Ambulatory Visit (HOSPITAL_COMMUNITY): Payer: Self-pay

## 2023-05-03 ENCOUNTER — Ambulatory Visit: Payer: PRIVATE HEALTH INSURANCE

## 2023-05-03 DIAGNOSIS — R2689 Other abnormalities of gait and mobility: Secondary | ICD-10-CM

## 2023-05-03 DIAGNOSIS — M6281 Muscle weakness (generalized): Secondary | ICD-10-CM

## 2023-05-03 DIAGNOSIS — R2681 Unsteadiness on feet: Secondary | ICD-10-CM

## 2023-05-03 NOTE — Therapy (Signed)
OUTPATIENT PHYSICAL THERAPY TREATMENT NOTE   Patient Name: Sandra Miller MRN: [id number] DOB:11-08-1975, 47 y.o., female Today's Date: 05/03/2023  END OF SESSION:  PT End of Session - 05/03/23 1441     Visit Number 2    Number of Visits 13    Date for PT Re-Evaluation 06/06/23    Authorization Type [employer]    PT Start Time 1445    PT Stop Time 1525    PT Time Calculation (min) 40 min    Activity Tolerance Patient tolerated treatment well    Behavior During Therapy WFL for tasks assessed/performed              Past Medical History:  Diagnosis Date   Arthritis    Bipolar 1 disorder (HCC)    Cancer (HCC)    vulva, and breast   Complication of anesthesia    wakes up during procedures   Depression    GAD (generalized anxiety disorder)    Genital HSV    currently per pt  no break out 03-22-2016    GERD (gastroesophageal reflux disease)    Graves disease    Hiatal hernia    History of cervical dysplasia    2012 laser ablation   History of esophageal dilatation    for dysphasia -- x2 dilated   History of gastric ulcer    History of Helicobacter pylori infection    remote hx   History of hidradenitis suppurativa    "gets all over body intermittantly"     History of hypertension    no issue since stopped drinking alcohol 2014   History of kidney stones    History of panic attacks    History of radiation therapy 03/16/2020-05/08/2020   vulva  Dr Antony Blackbird   History of radiation therapy 09/09/2020-10/23/2020   left chest wall/left SCV   Dr Antony Blackbird   Hypertension    Iron deficiency anemia    Left ureteral stone    OCD (obsessive compulsive disorder)    PONV (postoperative nausea and vomiting)    Pre-diabetes    PTSD (post-traumatic stress disorder)    Recovering alcoholic in remission (HCC)    since 2014   RLS (restless legs syndrome)    Smokers' cough (HCC)    Thyroid disease    Urgency of urination    Yeast infection involving the vagina and  surrounding area    secondary to taking antibiotic   Past Surgical History:  Procedure Laterality Date   CESAREAN SECTION  1995   w/  Bilateral Tubal Ligation   COLONOSCOPY  last one 08-09-2013   CYSTOSCOPY W/ URETERAL STENT PLACEMENT Left 03/29/2016   Procedure: CYSTOSCOPY WITH STENT REPLACEMENT;  Surgeon: Hildred Laser, MD;  Location: [facility];  Service: Urology;  Laterality: Left;   CYSTOSCOPY WITH RETROGRADE PYELOGRAM, URETEROSCOPY AND STENT PLACEMENT Left 03/08/2016   Procedure: CYSTOSCOPY WITH  LEFT RETROGRADE PYELOGRAM, AND STENT PLACEMENT;  Surgeon: Hildred Laser, MD;  Location: [facility];  Service: Urology;  Laterality: Left;   CYSTOSCOPY/RETROGRADE/URETEROSCOPY/STONE EXTRACTION WITH BASKET Left 03/29/2016   Procedure: CYSTOSCOPY/RETROGRADE/URETEROSCOPY/STONE EXTRACTION WITH BASKET;  Surgeon: Hildred Laser, MD;  Location: [facility];  Service: Urology;  Laterality: Left;   ENDOMETRIAL ABLATION W/ NOVASURE  04-01-2010   ESOPHAGOGASTRODUODENOSCOPY  last one 08-09-2013   KNEE ARTHROSCOPY Left as teen   LASER ABLATION OF THE CERVIX  2012 approx   MASTECTOMY WITH AXILLARY LYMPH NODE DISSECTION Left 07/27/2020   Procedure: LEFT  MASTECTOMY WITH LEFT RADIOACTIVE SEED GUIDED TARGETED AXILLARY LYMPH NODE DISSECTION;  Surgeon: Ovidio Kin, MD;  Location: [facility];  Service: General;  Laterality: Left;   PORT-A-CATH REMOVAL Right 05/18/2021   Procedure: REMOVAL PORT-A-CATH;  Surgeon: Manus Rudd, MD;  Location: [facility];  Service: General;  Laterality: Right;   PORTACATH PLACEMENT Right 03/06/2020   Procedure: INSERTION PORT-A-CATH WITH ULTRASOUND GUIDANCE;  Surgeon: Ovidio Kin, MD;  Location: [facility];  Service: General;  Laterality: Right;   ROBOTIC ASSISTED TOTAL HYSTERECTOMY WITH BILATERAL SALPINGO OOPHERECTOMY N/A 05/18/2021   Procedure: XI ROBOTIC ASSISTED TOTAL HYSTERECTOMY WITH BILATERAL SALPINGO OOPHORECTOMY;  Surgeon: Adolphus Birchwood, MD;  Location: [facility];  Service: Gynecology;  Laterality: N/A;   TRANSTHORACIC ECHOCARDIOGRAM  05-19-2006   lvsf normal, ef 55-65%, there was mild flattening of the interventricular septum during diastoli/  RV size at upper limits normal   TUBAL LIGATION     VULVA /PERINEUM BIOPSY N/A 05/18/2021   Procedure: VULVAR BIOPSY;  Surgeon: Adolphus Birchwood, MD;  Location: [facility];  Service: Gynecology;  Laterality: N/A;   WISDOM TOOTH EXTRACTION  age 28 's   Patient Active Problem List   Diagnosis Date Noted   Prolonged QT interval 04/10/2023   Dizziness 04/10/2023   Provoked seizures (HCC) 03/31/2023   Respiratory depression 03/31/2023   Status epilepticus (HCC) 03/31/2023   Migraine with aura and without status migrainosus, not intractable 10/03/2022   Pain in right knee 09/07/2022   Bilateral primary osteoarthritis of knee 09/07/2022   Chronic migraine without aura without status migrainosus, not intractable 07/07/2022   Graves disease 05/10/2022   Sebaceous cyst 12/06/2021   Pain in left knee 11/03/2021   Hyperthyroidism 08/13/2021   Bipolar 1 disorder (HCC) 05/07/2021   Obesity (BMI 30.0-34.9) 05/07/2021   Hyperlipidemia 08/12/2020   Prediabetes 08/12/2020   Hypomagnesemia 08/12/2020   Mutation in BRIP1 gene 06/24/2020   Hypokalemia 06/17/2020   Dehydration    Colitis 06/16/2020   Port-A-Cath in place 03/11/2020   Vulvar cancer (HCC) 01/29/2020   Vulvar ulceration 01/23/2020   Essential hypertension 10/31/2019   Nausea and vomiting 12/27/2018   Seasonal allergic rhinitis 12/27/2018   History of ELISA positive for HSV 10/01/2018   Restless leg syndrome 04/01/2016   Nephrolithiasis 03/08/2016   Obstructive pyelonephritis 03/08/2016   Normocytic anemia 03/08/2016   Hidradenitis suppurativa of left axilla 11/03/2015   Loss of weight 09/08/2015   Neck pain on left side 10/22/2014   Genital warts 05/20/2014   GERD (gastroesophageal reflux disease) 12/26/2012   Functional  dyspepsia 12/26/2012   Tobacco abuse 08/22/2012   Herpes simplex virus (HSV) infection 02/03/2007    PCP: Marcine Matar, MD  REFERRING PROVIDER: Marcine Matar, MD  REFERRING DIAG: Weakness [R53.1], Gait disturbance [R26.9]   THERAPY DIAG:  Muscle weakness (generalized)  Other abnormalities of gait and mobility  Unsteadiness on feet  Rationale for Evaluation and Treatment: Rehabilitation  ONSET DATE: 03/31/23  SUBJECTIVE:   SUBJECTIVE STATEMENT:  Patient reports pain in her Rt shoulder   Appointment with neurology on the 21st of this month.  Currently ambulating with partial rolling walker.  Legs feel weak.  Referred to physical therapy post discharge but no call as yet.  PERTINENT HISTORY: NO BP OR IV IN LEFT UE d/t Lymphedema   PMHx includes Arthritis, Bipolar 1, Vulva and Breast Cancer (currently in remission), Depression, GAD, Graves Disease, Anemia, OCD, pre-diabetes, PTSD, restless leg syndrome, seizure, chronic migraine    PAIN:  Are you  having pain? Yes, Rt shoulder; No back and LE pain  PRECAUTIONS: Other: Lymphedema Alert - NO BP on LEFT ARM    WEIGHT BEARING RESTRICTIONS: No  FALLS:  Has patient fallen in last 6 months?  "Not that I recall"  LIVING ENVIRONMENT: Lives with: lives with their family Lives in: House/apartment Stairs: No Has following equipment at home: Walker - 4 wheeled, bed side commode, and Grab bars  OCCUPATION: CNA  PLOF: Independent  PATIENT GOALS: "I want to be able to go back to work."   NEXT MD VISIT: 04/26/23 Neurologist, 05/02/23 Orthopedic   OBJECTIVE:   DIAGNOSTIC FINDINGS:   04/05/23: HIP UNILAT W or WO Pelvis 2-3 views IMPRESSION: Mild-to-moderate right and mild left femoroacetabular osteoarthritis.  PATIENT SURVEYS:  FOTO 39 current, 56 predicted   COGNITION: Overall cognitive status: Impaired - related to recent changes with memory as noted in subjective assessment      SENSATION: Not  tested  POSTURE: rounded shoulders and posterior pelvic tilt  LOWER EXTREMITY MMT:  MMT Right eval Left eval  Hip flexion 3+ 3+  Hip extension 4- 4-  Hip abduction 4- 4-  Hip adduction    Hip internal rotation 3+ 4-  Hip external rotation 3+ 4-  Knee flexion 3+ 4-  Knee extension 3+ 3+  Ankle dorsiflexion 4+ 4+  Ankle plantarflexion    Ankle inversion    Ankle eversion     (Blank rows = not tested)   FUNCTIONAL TESTS:  Timed up and go (TUG): TBD  Balance    Romberg EO on floor  30 sec, Min sway  Romberg EC on floor 30 sec, min sway  Romberg EO on airex 30 sec, mod sway  Romberg EC on airex  30 sec, mod sway     Balance Right Left   Tandem on floor 30 sec 30 sec  SLS on floor  11 sec 7 sec    GAIT: Distance walked: 50 feet Assistive device utilized: None Level of assistance: SBA Comments: slow gait speed - BIL small step length with decreased stance time    TODAY'S TREATMENT:               [facility] Adult PT Treatment:                                                DATE: 05/03/23 Therapeutic Exercise: Nustep level 4 x 5 mins (100 steps) Seated marching 2x30" Seated heel/toe raises 2# 2x10 Standing heel raises UE support 2x10 Standing hip abduction 2x10 BIL LAQ 2# 2x10 BIL Seated hamstring curl GTB 2x10 BIL Bridges 2x10 SLR x10 BIL Supine hip adduction ball squeeze 5" hold 2x10 Sidelying clamshell 2x10 BIL                                                                                                                  [facility] Adult  PT Treatment:                                                DATE: 04/25/2023   Initial evaluation: see patient education and home exercise program as noted below     PATIENT EDUCATION:  Education details: reviewed initial home exercise program; discussion of POC, prognosis and goals for skilled PT   Person educated: Patient Education method: Medical illustrator Education comprehension: verbalized understanding,  returned demonstration, and needs further education  HOME EXERCISE PROGRAM: Instructed to obtain and practice walking with SPC in Rt hand. Use of RW as needed.   ASSESSMENT:  CLINICAL IMPRESSION: Patient presents to first follow up session reporting pain in her Lt shoulder and that she does not use her RW at home but does use it for community ambulation. Session today focused on LE strengthening and improving standing activity tolerance as able. Plan to incorporate more SPC gait training in future sessions. Patient was able to tolerate all prescribed exercises with no adverse effects. Patient continues to benefit from skilled PT services and should be progressed as able to improve functional independence.    OBJECTIVE IMPAIRMENTS: Abnormal gait, decreased activity tolerance, decreased balance, decreased endurance, decreased mobility, difficulty walking, decreased strength, impaired perceived functional ability, improper body mechanics, postural dysfunction, and pain.   ACTIVITY LIMITATIONS: carrying, lifting, standing, squatting, stairs, transfers, bed mobility, locomotion level, and caring for others  PARTICIPATION LIMITATIONS: cleaning, community activity, and occupation  PERSONAL FACTORS: Past/current experiences, Time since onset of injury/illness/exacerbation, and 3+ comorbidities: PMHx includes Arthritis, Bipolar 1, Vulva and Breast Cancer (currently in remission), Depression, GAD, Graves Disease, Anemia, OCD, pre-diabetes, PTSD, restless leg syndrome, seizure, chronic migraine   are also affecting patient's functional outcome.   REHAB POTENTIAL: Fair    CLINICAL DECISION MAKING: Evolving/moderate complexity  EVALUATION COMPLEXITY: Moderate   GOALS: Goals reviewed with patient? Yes  SHORT TERM GOALS: Target date: 05/16/2023   Patient will be independent with initial home program for LE strengthening and balance . Baseline: to be initiated at follow up visit  Goal status:  INITIAL  2.  Patient will ambulate 6 minutes with LRAD without LOB.  Baseline: requires RW, unsteady with walking >50-122ft Goal status: INITIAL   LONG TERM GOALS: Target date: 06/06/2023   Patient will report improved overall functional ability with FOTO score of 56 predicted.  Baseline: 39 current Goal status: INITIAL  2.  Patient will perform SLS at least 20 sec on each LE in order to maximize safe and independent function.  Baseline:  Balance Right Left   Tandem on floor 30 sec 30 sec  SLS on floor  11 sec 7 sec   Goal status: INITIAL  3.  Patient will demonstrate at least 4 or 4+/5 MMT with all LE strength testing.   Baseline:  MMT Right eval Left eval  Hip flexion 3+ 3+  Hip extension 4- 4-  Hip abduction 4- 4-  Hip adduction    Hip internal rotation 3+ 4-  Hip external rotation 3+ 4-  Knee flexion 3+ 4-  Knee extension 3+ 3+  Ankle dorsiflexion 4+ 4+   Goal status: INITIAL  4.  Patient will demonstrate ability to walk 6+ minutes at normal gait speed without AD or LOB.  Baseline: requires RW  Goal status: INITIAL  5.  Patient will demonstrate ability to  ambulate over varied surfaces, including inclines without LOB and no more than minimal fatigue after 15 minutes of weight bearing activities.  Baseline: unable  Goal status: INITIAL    PLAN:  PT FREQUENCY: 1-2x/week  PT DURATION: 6 weeks  PLANNED INTERVENTIONS: Therapeutic exercises, Therapeutic activity, Neuromuscular re-education, Balance training, Gait training, Patient/Family education, Self Care, Joint mobilization, Electrical stimulation, Cryotherapy, Moist heat, Manual therapy, and Re-evaluation  PLAN FOR NEXT SESSION: balance (narrow BOS, SLS, uneven surfaces), gait activities encouraging increased SLS time, reactive balance, BIL LE strengthening    Berta Minor, PTA 05/03/2023, 3:28 PM

## 2023-05-04 ENCOUNTER — Other Ambulatory Visit: Payer: Self-pay

## 2023-05-05 ENCOUNTER — Encounter: Payer: Self-pay | Admitting: Nephrology

## 2023-05-06 ENCOUNTER — Other Ambulatory Visit (HOSPITAL_COMMUNITY): Payer: Self-pay

## 2023-05-11 ENCOUNTER — Telehealth (HOSPITAL_BASED_OUTPATIENT_CLINIC_OR_DEPARTMENT_OTHER): Payer: No Typology Code available for payment source | Admitting: Psychiatry

## 2023-05-11 ENCOUNTER — Other Ambulatory Visit (HOSPITAL_COMMUNITY): Payer: Self-pay

## 2023-05-11 ENCOUNTER — Encounter: Payer: Self-pay | Admitting: Sports Medicine

## 2023-05-11 ENCOUNTER — Ambulatory Visit: Payer: No Typology Code available for payment source | Admitting: Sports Medicine

## 2023-05-11 ENCOUNTER — Encounter (HOSPITAL_COMMUNITY): Payer: Self-pay | Admitting: Psychiatry

## 2023-05-11 ENCOUNTER — Other Ambulatory Visit (INDEPENDENT_AMBULATORY_CARE_PROVIDER_SITE_OTHER): Payer: No Typology Code available for payment source

## 2023-05-11 ENCOUNTER — Other Ambulatory Visit: Payer: Self-pay

## 2023-05-11 ENCOUNTER — Other Ambulatory Visit (HOSPITAL_BASED_OUTPATIENT_CLINIC_OR_DEPARTMENT_OTHER): Payer: Self-pay

## 2023-05-11 VITALS — Wt 183.0 lb

## 2023-05-11 DIAGNOSIS — M25512 Pain in left shoulder: Secondary | ICD-10-CM

## 2023-05-11 DIAGNOSIS — M7542 Impingement syndrome of left shoulder: Secondary | ICD-10-CM

## 2023-05-11 DIAGNOSIS — G8929 Other chronic pain: Secondary | ICD-10-CM

## 2023-05-11 DIAGNOSIS — F411 Generalized anxiety disorder: Secondary | ICD-10-CM | POA: Diagnosis not present

## 2023-05-11 DIAGNOSIS — M898X1 Other specified disorders of bone, shoulder: Secondary | ICD-10-CM

## 2023-05-11 DIAGNOSIS — F319 Bipolar disorder, unspecified: Secondary | ICD-10-CM

## 2023-05-11 MED ORDER — LIDOCAINE HCL 1 % IJ SOLN
2.0000 mL | INTRAMUSCULAR | Status: AC | PRN
Start: 2023-05-11 — End: 2023-05-11
  Administered 2023-05-11: 2 mL

## 2023-05-11 MED ORDER — METHYLPREDNISOLONE ACETATE 40 MG/ML IJ SUSP
40.0000 mg | INTRAMUSCULAR | Status: AC | PRN
Start: 2023-05-11 — End: 2023-05-11
  Administered 2023-05-11: 40 mg via INTRA_ARTICULAR

## 2023-05-11 MED ORDER — FLUOXETINE HCL 10 MG PO CAPS
30.0000 mg | ORAL_CAPSULE | Freq: Every day | ORAL | 1 refills | Status: DC
Start: 2023-05-11 — End: 2023-06-22
  Filled 2023-05-11: qty 90, 30d supply, fill #0
  Filled 2023-06-16: qty 90, 30d supply, fill #1

## 2023-05-11 MED ORDER — LAMOTRIGINE 25 MG PO TABS
ORAL_TABLET | ORAL | 1 refills | Status: DC
Start: 2023-05-11 — End: 2023-05-29
  Filled 2023-05-11: qty 90, 22d supply, fill #0
  Filled 2023-05-11: qty 30, 8d supply, fill #0

## 2023-05-11 MED ORDER — LORAZEPAM 1 MG PO TABS
1.0000 mg | ORAL_TABLET | Freq: Three times a day (TID) | ORAL | 1 refills | Status: DC | PRN
Start: 2023-05-11 — End: 2023-06-22
  Filled 2023-05-11 – 2023-06-07 (×2): qty 90, 30d supply, fill #0

## 2023-05-11 MED ORDER — BUPIVACAINE HCL 0.25 % IJ SOLN
2.0000 mL | INTRAMUSCULAR | Status: AC | PRN
Start: 2023-05-11 — End: 2023-05-11
  Administered 2023-05-11: 2 mL via INTRA_ARTICULAR

## 2023-05-11 NOTE — Progress Notes (Signed)
Patient states she has pain in her left shoulder, while pointing to the posterior aspect. She stated that she saw her PCP who mentioned arthritis.  Patient states she had 5 seizures on July 26th and has experience shoulder pain since then. She says that she does not remember this event but that the back of her head still hurts so she knows she hit something. Patient has been using Voltaren and other topicals, heat, and heat patches with no relief. Patient says she only experiences pain and declines any popping or numbness.

## 2023-05-11 NOTE — Progress Notes (Signed)
Sandra Miller - 47 y.o. female MRN [id number]  Date of birth: 08-21-1976  Office Visit Note: Visit Date: 05/11/2023 PCP: Marcine Matar, MD Referred by: Marcine Matar, MD  Subjective: Chief Complaint  Patient presents with   Left Shoulder - Pain   HPI: Sandra Miller is a pleasant 47 y.o. female who presents today for left shoulder pain.  Has pain over the posterior aspect of the left shoulder.  She had a number of seizures back in the end of July and has had shoulder pain since this time.  In some of her medical condition she was unfortunately in the ICU for a while, she has since been out of this but her left shoulder pain has continued.  She has tried Voltaren, heat, topical medications without much improvement.  She denies any radiating pain, no numbness tingling or instability.  Pertinent ROS were reviewed with the patient and found to be negative unless otherwise specified above in HPI.   Assessment & Plan: Visit Diagnoses:  1. Chronic left shoulder pain   2. Impingement syndrome of left shoulder   3. Chronic scapular pain    Plan: Discussed with Sandra Miller the nature of her left shoulder pain which was exacerbated after she had a number of seizures back at the end of July.  Her exam suggest a degree of impingement and some spurring off the inferior aspect of the acromion.  She also has some posterior scapular pain.  We discussed all treatment options such as oral medication, physical therapy, injection therapy, advanced imaging.  We will get her started in formalized physical therapy for the left shoulder as well as her left scapular pain, referral sent to [facility] location.  We also proceeded with a subacromial joint injection, patient tolerated well.  May use ice and Tylenol for any postinjection pain.  She will follow-up with me as needed in 6 weeks if the shoulder is not doing better.  Follow-up: Return in about 6 weeks (around 06/22/2023), or if symptoms worsen  or fail to improve, for for L-shoulder.   Meds & Orders: No orders of the defined types were placed in this encounter.   Orders Placed This Encounter  Procedures   Large Joint Inj   XR Shoulder Left   Ambulatory referral to Physical Therapy     Procedures: Large Joint Inj: L subacromial bursa on 05/11/2023 3:45 PM Indications: pain Details: 22 G 1.5 in needle, posterior approach Medications: 2 mL lidocaine 1 %; 2 mL bupivacaine 0.25 %; 40 mg methylPREDNISolone acetate 40 MG/ML Outcome: tolerated well, no immediate complications  Subacromial Joint Injection, Left Shoulder After discussion on risks/benefits/indications, informed verbal consent was obtained. A timeout was then performed. Patient was seated on table in exam room. The patient's shoulder was prepped with betadine and alcohol swabs and utilizing posterior approach a 22G, 1.5" needle was directed anteriorly and laterally into the patient's subacromial space was injected with 2:2:1 mixture of lidocaine:bupivicaine:depomedrol with appreciation of free-flowing of the injectate into the bursal space. Patient tolerated the procedure well without immediate complications.   Procedure, treatment alternatives, risks and benefits explained, specific risks discussed. Consent was given by the patient. Immediately prior to procedure a time out was called to verify the correct patient, procedure, equipment, support staff and site/side marked as required. Patient was prepped and draped in the usual sterile fashion.          Clinical History: No specialty comments available.  She reports that she has been  smoking cigarettes. She has a 11 pack-year smoking history. She has never used smokeless tobacco.  Recent Labs    08/18/22 1511 04/10/23 0940  HGBA1C 5.8 5.3    Objective:    Physical Exam  Gen: Well-appearing, in no acute distress; non-toxic CV: Well-perfused. Warm.  Resp: Breathing unlabored on room air; no wheezing. Psych: Fluid  speech in conversation; appropriate affect; normal thought process Neuro: Sensation intact throughout. No gross coordination deficits.   Ortho Exam - Left shoulder: Some mild TTP over the posterior aspect of the shoulder joint and the superior medial scapular border.  There are some reciprocal hypertonicity of the left trapezius muscle.  There is full active and passive range of motion of the shoulder in all directions.  Rotator cuff testing appears intact.  There is some pain with resisted abduction, positive Hawkins impingement testing.  Imaging: XR Shoulder Left  Result Date: 05/11/2023 3 views of the left shoulder including Grashey, scapular Y and axial view were ordered and reviewed by myself.  There is minimal glenohumeral joint arthritic change.  There is some spurring off the inferior aspect of the acromion noted on Grashey and axial view.  No acute bony fracture otherwise bony abnormality noted.   Past Medical/Family/Surgical/Social History: Medications & Allergies reviewed per EMR, new medications updated. Patient Active Problem List   Diagnosis Date Noted   Prolonged QT interval 04/10/2023   Dizziness 04/10/2023   Provoked seizures (HCC) 03/31/2023   Respiratory depression 03/31/2023   Status epilepticus (HCC) 03/31/2023   Migraine with aura and without status migrainosus, not intractable 10/03/2022   Pain in right knee 09/07/2022   Bilateral primary osteoarthritis of knee 09/07/2022   Chronic migraine without aura without status migrainosus, not intractable 07/07/2022   Graves disease 05/10/2022   Sebaceous cyst 12/06/2021   Pain in left knee 11/03/2021   Hyperthyroidism 08/13/2021   Bipolar 1 disorder (HCC) 05/07/2021   Obesity (BMI 30.0-34.9) 05/07/2021   Hyperlipidemia 08/12/2020   Prediabetes 08/12/2020   Hypomagnesemia 08/12/2020   Mutation in BRIP1 gene 06/24/2020   Hypokalemia 06/17/2020   Dehydration    Colitis 06/16/2020   Port-A-Cath in place 03/11/2020    Vulvar cancer (HCC) 01/29/2020   Vulvar ulceration 01/23/2020   Essential hypertension 10/31/2019   Nausea and vomiting 12/27/2018   Seasonal allergic rhinitis 12/27/2018   History of ELISA positive for HSV 10/01/2018   Restless leg syndrome 04/01/2016   Nephrolithiasis 03/08/2016   Obstructive pyelonephritis 03/08/2016   Normocytic anemia 03/08/2016   Hidradenitis suppurativa of left axilla 11/03/2015   Loss of weight 09/08/2015   Neck pain on left side 10/22/2014   Genital warts 05/20/2014   GERD (gastroesophageal reflux disease) 12/26/2012   Functional dyspepsia 12/26/2012   Tobacco abuse 08/22/2012   Herpes simplex virus (HSV) infection 02/03/2007   Past Medical History:  Diagnosis Date   Arthritis    Bipolar 1 disorder (HCC)    Cancer (HCC)    vulva, and breast   Complication of anesthesia    wakes up during procedures   Depression    GAD (generalized anxiety disorder)    Genital HSV    currently per pt  no break out 03-22-2016    GERD (gastroesophageal reflux disease)    Graves disease    Hiatal hernia    History of cervical dysplasia    2012 laser ablation   History of esophageal dilatation    for dysphasia -- x2 dilated   History of gastric ulcer    History  of Helicobacter pylori infection    remote hx   History of hidradenitis suppurativa    "gets all over body intermittantly"     History of hypertension    no issue since stopped drinking alcohol 2014   History of kidney stones    History of panic attacks    History of radiation therapy 03/16/2020-05/08/2020   vulva  Dr Antony Blackbird   History of radiation therapy 09/09/2020-10/23/2020   left chest wall/left SCV   Dr Antony Blackbird   Hypertension    Iron deficiency anemia    Left ureteral stone    OCD (obsessive compulsive disorder)    PONV (postoperative nausea and vomiting)    Pre-diabetes    PTSD (post-traumatic stress disorder)    Recovering alcoholic in remission (HCC)    since 2014   RLS (restless  legs syndrome)    Smokers' cough (HCC)    Thyroid disease    Urgency of urination    Yeast infection involving the vagina and surrounding area    secondary to taking antibiotic   Family History  Problem Relation Age of Onset   Heart disease Father    Lung cancer Father        d. 81   Alcohol abuse Father    Heart disease Mother    Depression Mother    Anxiety disorder Mother    Drug abuse Brother    Alcohol abuse Brother    Drug abuse Brother    ADD / ADHD Brother    Colon polyps Brother    Cancer Paternal Grandfather        "stomach"   Diabetes Maternal Grandfather    Diabetes Paternal Grandmother    Kidney disease Maternal Uncle    Cirrhosis Cousin        alcoholic   Anxiety disorder Maternal Aunt    Depression Maternal Aunt    Cancer Cousin        maternal; ovarian cancer or other "female" cancer?   Lung cancer Paternal Uncle 1   Throat cancer Cousin        paternal; dx 55s   Lung cancer Cousin        paternal; dx 56s   Past Surgical History:  Procedure Laterality Date   CESAREAN SECTION  1995   w/  Bilateral Tubal Ligation   COLONOSCOPY  last one 08-09-2013   CYSTOSCOPY W/ URETERAL STENT PLACEMENT Left 03/29/2016   Procedure: CYSTOSCOPY WITH STENT REPLACEMENT;  Surgeon: Hildred Laser, MD;  Location[facility];  Service: Urology;  Laterality: Left;   CYSTOSCOPY WITH RETROGRADE PYELOGRAM, URETEROSCOPY AND STENT PLACEMENT Left 03/08/2016   Procedure: CYSTOSCOPY WITH  LEFT RETROGRADE PYELOGRAM, AND STENT PLACEMENT;  Surgeon: Hildred Laser, MD;  Location: [facility];  Service: Urology;  Laterality: Left;   CYSTOSCOPY/RETROGRADE/URETEROSCOPY/STONE EXTRACTION WITH BASKET Left 03/29/2016   Procedure: CYSTOSCOPY/RETROGRADE/URETEROSCOPY/STONE EXTRACTION WITH BASKET;  Surgeon: Hildred Laser, MD;  Location: [facility];  Service: Urology;  Laterality: Left;   ENDOMETRIAL ABLATION W/ NOVASURE  04-01-2010   ESOPHAGOGASTRODUODENOSCOPY   last one 08-09-2013   KNEE ARTHROSCOPY Left as teen   LASER ABLATION OF THE CERVIX  2012 approx   MASTECTOMY WITH AXILLARY LYMPH NODE DISSECTION Left 07/27/2020   Procedure: LEFT MASTECTOMY WITH LEFT RADIOACTIVE SEED GUIDED TARGETED AXILLARY LYMPH NODE DISSECTION;  Surgeon: Ovidio Kin, MD;  Location: [facility];  Service: General;  Laterality: Left;   PORT-A-CATH REMOVAL Right 05/18/2021   Procedure: REMOVAL PORT-A-CATH;  Surgeon:  Manus Rudd, MD;  Location: [facility];  Service: General;  Laterality: Right;   PORTACATH PLACEMENT Right 03/06/2020   Procedure: INSERTION PORT-A-CATH WITH ULTRASOUND GUIDANCE;  Surgeon: Ovidio Kin, MD;  Location: [facility];  Service: General;  Laterality: Right;   ROBOTIC ASSISTED TOTAL HYSTERECTOMY WITH BILATERAL SALPINGO OOPHERECTOMY N/A 05/18/2021   Procedure: XI ROBOTIC ASSISTED TOTAL HYSTERECTOMY WITH BILATERAL SALPINGO OOPHORECTOMY;  Surgeon: Adolphus Birchwood, MD;  Location: [facility];  Service: Gynecology;  Laterality: N/A;   TRANSTHORACIC ECHOCARDIOGRAM  05-19-2006   lvsf normal, ef 55-65%, there was mild flattening of the interventricular septum during diastoli/  RV size at upper limits normal   TUBAL LIGATION     VULVA /PERINEUM BIOPSY N/A 05/18/2021   Procedure: VULVAR BIOPSY;  Surgeon: Adolphus Birchwood, MD;  Location: [facility];  Service: Gynecology;  Laterality: N/A;   WISDOM TOOTH EXTRACTION  age 47 's   Social History   Occupational History   Not on file  Tobacco Use   Smoking status: Every Day    Current packs/day: 0.50    Average packs/day: 0.5 packs/day for 22.0 years (11.0 ttl pk-yrs)    Types: Cigarettes   Smokeless tobacco: Never   Tobacco comments:    Recently started a smoking cessation class.   Vaping Use   Vaping status: Never Used  Substance and Sexual Activity   Alcohol use: Not Currently   Drug use: Yes    Types: Marijuana   Sexual activity: Yes    Partners: Male    Birth control/protection: Surgical

## 2023-05-11 NOTE — Progress Notes (Signed)
[facility] MD Virtual Progress Note   Patient Location: Home Provider Location: Home Office  I connect with patient by video and verified that I am speaking with correct person by using two identifiers. I discussed the limitations of evaluation and management by telemedicine and the availability of in person appointments. I also discussed with the patient that there may be a patient responsible charge related to this service. The patient expressed understanding and agreed to proceed.  Sandra Miller [id number] 47 y.o.  05/11/2023 10:07 AM  History of Present Illness:  Patient is evaluated by video session.  She reported a lot of things happen since the last visit.  She had a seizure in July.  Her electrolytes were abnormal.  She has no magnesium, potassium.  She had persistent vomiting nausea and she had lost a lot of weight.  She also had an abnormal EKG with prolonged QTc.  She is seeing nephrology, cardiology.  Her olanzapine was discontinued by cardiology due to abnormal EKG.  Patient not sure because she has been taking the olanzapine for more than 10 years however due to concern of prolonged QTc it was discontinued.  She also saw neurology.  Patient not sure what caused the seizures and she does not have prior history of epilepsy.  However due to abnormal electrolytes, dehydration could be the reason as her neurologist explained to the patient.  Patient admitted a lot of depression, anxiety, frustration because she could not drive.  She has extreme fatigue, headaches.  She still have nausea.  She has to use a walker to go to the doctor's appointment.  She also reported not able to remember things when she was in the hospital.  She had lost more than 10 pounds.  She is now able to go to work and she has appointment coming up in first week of October to see her primary care.  Her Ativan and Prozac remain unchanged.  We started her on Lamictal after olanzapine was discontinued.  She  is taking 50 mg and so far she has no rash, itching.  She still feels very sad depressed but denies any suicidal thoughts or homicidal thoughts.  She denies any hallucination.  She feels her independence is gone and she is sad about it.  She is doing physical therapy to get the strength back.  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  She denies any manic episodes but rather more depressed and sad.  We have communicated with her neurologist and she agreed to consider Lamictal and optimize the dose which can help her depression, mood symptoms and seizure activities.  Patient is on Keppra but neurologist eventually will decrease and discontinue once Lamictal dose after months.  Patient lives with her mother.  Her son and daughter are very helpful and take her to the doctor's appointment.  Her sleep is fair.  Past Psychiatric History: H/O bipolar disorder, alcohol dependence and marijuana abuse.  H/O at least 6 inpatient. Tried lithium, Seroquel, Lamictal, Paxil, Celexa, Abilify, Geodon, Klonopin, Depakote, Neurontin and Cymbalta.  No H/o history of suicidal attempt.  Olanzapine helped but it was discontinued in July 2024 due to abnormal EKG.   Outpatient Encounter Medications as of 05/11/2023  Medication Sig   diclofenac Sodium (VOLTAREN) 1 % GEL Apply 4 grams topically 4  times daily as needed. (Patient taking differently: Apply 4 g topically 4 (four) times daily as needed (pain).)  FLUoxetine (PROZAC) 10 MG capsule Take 3 capsules (30 mg total) by mouth daily. (Patient taking differently: Take 30 mg by mouth 3 (three) times daily.)   gabapentin (NEURONTIN) 300 MG capsule Take 1 capsule (300 mg) by mouth daily.   lamoTRIgine (LAMICTAL) 25 MG tablet Take 1 tablet (25 mg total) daily for 7 days. After 7 days, take 2 tablets (50 mg total) daily.   letrozole (FEMARA) 2.5 MG tablet Take 1 tablet by mouth daily.   levETIRAcetam (KEPPRA) 750 MG  tablet Take 1 tablet (750 mg total) by mouth 2 (two) times daily.   levETIRAcetam (KEPPRA) 750 MG tablet Take 1 tablet (750 mg total) by mouth 2 (two) times daily.   lisinopril (ZESTRIL) 10 MG tablet Take 1 tablet (10 mg total) by mouth daily. Stop the Lisinopril/HCTZ combination   loratadine (CLARITIN REDITABS) 10 MG dissolvable tablet Take 10 mg by mouth daily. OTC   LORazepam (ATIVAN) 1 MG tablet Take 1 tablet (1 mg total) by mouth 3 (three) times daily as needed for anxiety (Patient taking differently: Take 1 mg by mouth 3 (three) times daily as needed for anxiety or seizure.)   Magnesium 400 MG TABS Take 800 mg by mouth 2 (two) times daily.   methimazole (TAPAZOLE) 5 MG tablet Take one tablet (5 mg dose) by mouth daily. (Patient taking differently: Take 5 mg by mouth daily.)   Multiple Vitamin (MULTIVITAMIN WITH MINERALS) TABS tablet Take 1 tablet by mouth daily.   Multiple Vitamins-Minerals (CERTA-VITE PO) Take by mouth daily. OTC   omeprazole (PRILOSEC) 40 MG capsule Take 1 capsule (40 mg) by mouth in the morning and at bedtime.   potassium chloride SA (KLOR-CON M) 20 MEQ tablet Take 1 tablet (20 mEq) by mouth daily.   propranolol (INDERAL) 10 MG tablet Take 1 tablet by mouth 2 times daily.   senna-docusate (SENOKOT-S) 8.6-50 MG tablet Take 2 tablets by mouth at bedtime. For AFTER surgery only, do not take if having diarrhea   spironolactone (ALDACTONE) 25 MG tablet Take 0.5 tablets (12.5 mg total) by mouth daily.   [DISCONTINUED] prochlorperazine (COMPAZINE) 10 MG tablet TAKE 1 TABLET BY MOUTH EVERY 6 HOURS AS NEEDED FOR NAUSEA OR VOMITING (Patient taking differently: Take 10 mg by mouth every 6 (six) hours as needed for nausea or vomiting.)   No facility-administered encounter medications on file as of 05/11/2023.    Recent Results (from the past 2160 hour(s))  Basic Metabolic Panel     Status: Abnormal   Collection Time: 03/16/23  9:53 AM  Result Value Ref Range   Glucose 83 70 - 99  mg/dL   BUN 5 (L) 6 - 24 mg/dL   Creatinine, Ser 1.61 (L) 0.57 - 1.00 mg/dL   eGFR 096 >04 VW/UJW/1.19   BUN/Creatinine Ratio 9 9 - 23   Sodium 144 134 - 144 mmol/L   Potassium 3.7 3.5 - 5.2 mmol/L   Chloride 103 96 - 106 mmol/L   CO2 24 20 - 29 mmol/L   Calcium 10.1 8.7 - 10.2 mg/dL  H. pylori breath test     Status: None   Collection Time: 03/16/23 10:17 AM  Result Value Ref Range   H pylori Breath Test Negative Negative  CBG monitoring, ED     Status: Abnormal   Collection Time: 03/31/23 10:54 AM  Result Value Ref Range   Glucose-Capillary 304 (H) 70 - 99 mg/dL    Comment: Glucose reference range applies only to samples taken after fasting for at  least 8 hours.  Ethanol     Status: None   Collection Time: 03/31/23 10:58 AM  Result Value Ref Range   Alcohol, Ethyl (B) <10 <10 mg/dL    Comment: (NOTE) Lowest detectable limit for serum alcohol is 10 mg/dL.  For medical purposes only. Performed at [facility], [address] [place], [place] [address]   CBC with Differential/Platelet     Status: Abnormal   Collection Time: 03/31/23 11:03 AM  Result Value Ref Range   WBC 25.8 (H) 4.0 - 10.5 K/uL   RBC 5.04 3.87 - 5.11 MIL/uL   Hemoglobin 15.1 (H) 12.0 - 15.0 g/dL   HCT 91.4 (H) 78.2 - 95.6 %   MCV 97.2 80.0 - 100.0 fL   MCH 30.0 26.0 - 34.0 pg   MCHC 30.8 30.0 - 36.0 g/dL   RDW 21.3 08.6 - 57.8 %   Platelets 455 (H) 150 - 400 K/uL   nRBC 0.0 0.0 - 0.2 %   Neutrophils Relative % 84 %   Neutro Abs 21.5 (H) 1.7 - 7.7 K/uL   Lymphocytes Relative 11 %   Lymphs Abs 2.9 0.7 - 4.0 K/uL   Monocytes Relative 4 %   Monocytes Absolute 1.1 (H) 0.1 - 1.0 K/uL   Eosinophils Relative 0 %   Eosinophils Absolute 0.0 0.0 - 0.5 K/uL   Basophils Relative 0 %   Basophils Absolute 0.1 0.0 - 0.1 K/uL   Immature Granulocytes 1 %   Abs Immature Granulocytes 0.20 (H) 0.00 - 0.07 K/uL    Comment: Performed at [facility], [address], [place], [place] [address]  TSH      Status: Abnormal   Collection Time: 03/31/23 11:06 AM  Result Value Ref Range   TSH 4.711 (H) 0.350 - 4.500 uIU/mL    Comment: Performed by a 3rd Generation assay with a functional sensitivity of <=0.01 uIU/mL. Performed at [facility], [address] [place], [place] [address]   Comprehensive metabolic panel     Status: Abnormal   Collection Time: 03/31/23 11:12 AM  Result Value Ref Range   Sodium 143 135 - 145 mmol/L   Potassium 3.4 (L) 3.5 - 5.1 mmol/L   Chloride 100 98 - 111 mmol/L   CO2 10 (L) 22 - 32 mmol/L   Glucose, Bld 316 (H) 70 - 99 mg/dL    Comment: Glucose reference range applies only to samples taken after fasting for at least 8 hours.   BUN 10 6 - 20 mg/dL   Creatinine, Ser 1.32 0.44 - 1.00 mg/dL   Calcium 44.0 (H) 8.9 - 10.3 mg/dL   Total Protein 8.3 (H) 6.5 - 8.1 g/dL   Albumin 4.7 3.5 - 5.0 g/dL   AST 47 (H) 15 - 41 U/L   ALT 32 0 - 44 U/L   Alkaline Phosphatase 56 38 - 126 U/L   Total Bilirubin 0.8 0.3 - 1.2 mg/dL   GFR, Estimated >10 >27 mL/min    Comment: (NOTE) Calculated using the CKD-EPI Creatinine Equation (2021)    Anion gap 33 (H) 5 - 15    Comment: ELECTROLYTES REPEATED TO VERIFY Performed at [facility], [address] [place], [place] [address]   Magnesium     Status: Abnormal   Collection Time: 03/31/23 11:12 AM  Result Value Ref Range   Magnesium 0.8 (LL) 1.7 - 2.4 mg/dL    Comment: CRITICAL RESULT CALLED TO, READ BACK BY AND VERIFIED  WITH S,BERTRAND RN @1434  03/31/23 E,BENTON Performed at [facility], [address], [place], [place] [address]   Phosphorus     Status: Abnormal   Collection Time: 03/31/23 11:12 AM  Result Value Ref Range   Phosphorus 5.4 (H) 2.5 - 4.6 mg/dL    Comment: Performed at [facility], [address], [place], [place] [address]  I-STAT 7, (LYTES, BLD GAS, ICA, H+H)     Status: Abnormal   Collection Time: 03/31/23  5:04 PM  Result Value Ref Range   pH, Arterial 7.285 (L) 7.35 - 7.45    pCO2 arterial 49.0 (H) 32 - 48 mmHg   pO2, Arterial 264 (H) 83 - 108 mmHg   Bicarbonate 23.3 20.0 - 28.0 mmol/L   TCO2 25 22 - 32 mmol/L   O2 Saturation 100 %   Acid-base deficit 4.0 (H) 0.0 - 2.0 mmol/L   Sodium 140 135 - 145 mmol/L   Potassium <2.0 (LL) 3.5 - 5.1 mmol/L   Calcium, Ion 1.21 1.15 - 1.40 mmol/L   HCT 43.0 36.0 - 46.0 %   Hemoglobin 14.6 12.0 - 15.0 g/dL   Sample type ARTERIAL    Comment NOTIFIED PHYSICIAN   Glucose, capillary     Status: Abnormal   Collection Time: 03/31/23  5:31 PM  Result Value Ref Range   Glucose-Capillary 200 (H) 70 - 99 mg/dL    Comment: Glucose reference range applies only to samples taken after fasting for at least 8 hours.  MRSA Next Gen by PCR, Nasal     Status: None   Collection Time: 03/31/23  5:39 PM   Specimen: Nasal Mucosa; Nasal Swab  Result Value Ref Range   MRSA by PCR Next Gen NOT DETECTED NOT DETECTED    Comment: (NOTE) The GeneXpert MRSA Assay (FDA approved for NASAL specimens only), is one component of a comprehensive MRSA colonization surveillance program. It is not intended to diagnose MRSA infection nor to guide or monitor treatment for MRSA infections. Test performance is not FDA approved in patients less than 3 years old. Performed at [facility], [address] [place], [place] [address]   Meningitis/Encephalitis Panel (CSF)     Status: None   Collection Time: 03/31/23  6:08 PM  Result Value Ref Range   Cryptococcus neoformans/gattii (CSF) NOT DETECTED NOT DETECTED    Comment: (NOTE) Patients with a suspicion of cryptococcal meningitis should be tested  for cryptococcal antigen (CrAg).      Cytomegalovirus (CSF) NOT DETECTED NOT DETECTED   Enterovirus (CSF) NOT DETECTED NOT DETECTED   Escherichia coli K1 (CSF) NOT DETECTED NOT DETECTED    Comment: (NOTE) Only E. coli strains possessing the K1 capsular antigen will be detected.      Haemophilus influenzae (CSF) NOT DETECTED NOT DETECTED   Herpes  simplex virus 1 (CSF) NOT DETECTED NOT DETECTED   Herpes simplex virus 2 (CSF) NOT DETECTED NOT DETECTED   Human herpesvirus 6 (CSF) NOT DETECTED NOT DETECTED   Human parechovirus (CSF) NOT DETECTED NOT DETECTED   Listeria monocytogenes (CSF) NOT DETECTED NOT DETECTED   Neisseria meningitis (CSF) NOT DETECTED NOT DETECTED    Comment: (NOTE) Only encapsulated strains of N. meningitidis will be detected.     Streptococcus agalactiae (CSF) NOT DETECTED NOT DETECTED   Streptococcus pneumoniae (CSF) NOT DETECTED NOT DETECTED   Varicella zoster virus (CSF) NOT DETECTED NOT DETECTED    Comment: Performed at [facility], [address]  [place], [place] [address]  CSF cell count with differential collection tube #: 1     Status: Abnormal   Collection Time: 03/31/23  6:08 PM  Result Value Ref Range   Tube # 1    Color, CSF COLORLESS COLORLESS   Appearance, CSF CLEAR (A) CLEAR   Supernatant NOT INDICATED    RBC Count, CSF 140 (H) 0 /cu mm   WBC, CSF 1 0 - 5 /cu mm   Other Cells, CSF TOO FEW TO COUNT, SMEAR AVAILABLE FOR REVIEW     Comment: RARE LYMPHOCYTES, RARE NEUTROPHILS, AND RARE MONOCYTES/MACROPHAGES Performed at [facility], [address] [place], [place] [address]   CSF cell count with differential     Status: Abnormal   Collection Time: 03/31/23  6:08 PM  Result Value Ref Range   Tube # 4    Color, CSF COLORLESS COLORLESS   Appearance, CSF CLEAR (A) CLEAR   Supernatant NOT INDICATED    RBC Count, CSF 455 (H) 0 /cu mm   WBC, CSF 2 0 - 5 /cu mm   Other Cells, CSF TOO FEW TO COUNT, SMEAR AVAILABLE FOR REVIEW     Comment: FEW LYMPHOCYTES, FEW NEUTROPHILS, AND RARE MONOCYTES/MACROPHAGES Performed at [facility], [address] [place], [place] [address]   CSF culture w Gram Stain     Status: None   Collection Time: 03/31/23  6:08 PM   Specimen: CSF; Cerebrospinal Fluid  Result Value Ref Range   Specimen Description CSF    Special Requests LP    Gram  Stain NO WBC SEEN NO ORGANISMS SEEN     Culture      NO GROWTH 3 DAYS Performed at [facility], [address] [place], [place] [address]    Report Status 04/03/2023 FINAL   Protein and glucose, CSF     Status: Abnormal   Collection Time: 03/31/23  6:08 PM  Result Value Ref Range   Glucose, CSF 132 (H) 40 - 70 mg/dL   Total  Protein, CSF 43 15 - 45 mg/dL    Comment: Performed at [facility], [address], [place], [place] [address]  Protime-INR     Status: Abnormal   Collection Time: 03/31/23  6:52 PM  Result Value Ref Range   Prothrombin Time >90.0 (H) 11.4 - 15.2 seconds    Comment: QUESTIONABLE RESULTS, RECOMMEND RECOLLECT TO VERIFY NOTIFIED A,WILSON RN @2048  03/31/23 E,BENTON CORRECTED ON 07/26 AT 2022: PREVIOUSLY REPORTED AS >90.0    INR >10.0 (HH) 0.8 - 1.2    Comment: QUESTIONABLE RESULTS, RECOMMEND RECOLLECT TO VERIFY NOTIFIED A,WILSON RN @2048  03/31/23 E,BENTON (NOTE) INR goal varies based on device and disease states. Performed at [facility], [address] [place], [place] [address] CORRECTED ON 07/26 AT 2110: PREVIOUSLY REPORTED AS >10.0 J. WILSON RN @2018  ON 03/31/23 BY MAB MT, CORRECTED ON 07/26 AT 2022: PREVIOUSLY REPORTED AS >10.0 CRITICAL RESULT CALLED TO, READ BACK BY AND VERIFIED WITH: REPEATED TO VERIFY Roslynn Amble RN @2018   ON 03/31/23 BY MAB MT   T4, free     Status: None   Collection Time: 03/31/23  6:52 PM  Result Value Ref Range   Free T4 1.11 0.61 - 1.12 ng/dL    Comment: NOTIFIED A,WILSON RN @2048  03/31/23 E,BENTON QUESTIONABLE RESULTS, RECOMMEND RECOLLECT TO VERIFY (NOTE) Biotin ingestion may interfere with free T4 tests. If the results are inconsistent with the TSH level, previous test results, or  the clinical presentation, then consider biotin interference. If needed, order repeat testing after stopping biotin. Performed at [facility], [address] [place], [place] [address] CORRECTED ON 07/26 AT 2109:  PREVIOUSLY REPORTED AS 1.11 NOTIFIED A,WILSON RN @2048  03/31/23 E,BENTON   CK     Status: Abnormal   Collection Time: 03/31/23  6:52 PM  Result Value Ref Range   Total CK 2,723 (H) 38 - 234 U/L    Comment: QUESTIONABLE RESULTS, RECOMMEND RECOLLECT TO VERIFY NOTIFIED A,WILSON RN @2048  03/31/23 E,BENTON Performed at [facility], [address], [place], [place] [address]   Culture, blood (Routine X 2) w Reflex to ID Panel     Status: None   Collection Time: 03/31/23  6:52 PM   Specimen: BLOOD RIGHT ARM  Result Value Ref Range   Specimen Description BLOOD RIGHT ARM    Special Requests      BOTTLES DRAWN AEROBIC ONLY Blood Culture results may not be optimal due to an inadequate volume of blood received in culture bottles   Culture      NO GROWTH 5 DAYS Performed at [facility], [address] [place], [place] [address]    Report Status 04/05/2023 FINAL   Beta-hydroxybutyric acid     Status: None   Collection Time: 03/31/23  6:52 PM  Result Value Ref Range   Beta-Hydroxybutyric Acid 0.24 0.05 - 0.27 mmol/L    Comment: QUESTIONABLE RESULTS, RECOMMEND RECOLLECT TO VERIFY NOTIFIED A,WILSON RN @2048  03/31/23 E,BENTON Performed at [facility], [address] [place], [place] [address]   Culture, blood (Routine X 2) w Reflex to ID Panel     Status: None   Collection Time: 03/31/23  7:02 PM   Specimen: BLOOD RIGHT HAND  Result Value Ref Range   Specimen Description BLOOD RIGHT HAND    Special Requests      BOTTLES DRAWN AEROBIC ONLY Blood Culture adequate volume   Culture      NO GROWTH 5 DAYS Performed at [facility], [address], [place], [place] [address]    Report Status 04/05/2023 FINAL   Glucose, capillary     Status: Abnormal   Collection Time: 03/31/23  7:28 PM  Result Value Ref Range   Glucose-Capillary 157 (H) 70 - 99 mg/dL    Comment: Glucose reference range applies only to samples taken after fasting for at least 8 hours.  Protime-INR      Status: None   Collection Time: 03/31/23  9:47 PM  Result Value Ref Range   Prothrombin Time 14.6 11.4 - 15.2 seconds   INR 1.1 0.8 - 1.2    Comment: (NOTE) INR goal varies based on device and disease states. Performed at [facility], [address] [place], [place] [address]   APTT     Status: None   Collection Time: 03/31/23  9:47 PM  Result Value Ref Range   aPTT 33 24 - 36 seconds    Comment: Performed at [facility], [address], [place], [place] [address]  Hepatic function panel     Status: Abnormal   Collection Time: 03/31/23  9:47 PM  Result Value Ref Range   Total Protein 6.7 6.5 - 8.1 g/dL   Albumin 3.4 (L) 3.5 - 5.0 g/dL   AST 86 (H) 15 - 41 U/L   ALT 30 0 - 44 U/L   Alkaline Phosphatase 42 38 - 126 U/L  Total Bilirubin 0.6 0.3 - 1.2 mg/dL   Bilirubin, Direct 0.1 0.0 - 0.2 mg/dL   Indirect Bilirubin 0.5 0.3 - 0.9 mg/dL    Comment: Performed at [facility], [address], [place], [place] [address]  Magnesium     Status: None   Collection Time: 03/31/23  9:48 PM  Result Value Ref Range   Magnesium 2.1 1.7 - 2.4 mg/dL    Comment: Performed at [facility], [address] [place], [place] [address]  Comprehensive metabolic panel     Status: Abnormal   Collection Time: 03/31/23  9:48 PM  Result Value Ref Range   Sodium 142 135 - 145 mmol/L   Potassium 2.9 (L) 3.5 - 5.1 mmol/L   Chloride 103 98 - 111 mmol/L   CO2 22 22 - 32 mmol/L   Glucose, Bld 111 (H) 70 - 99 mg/dL    Comment: Glucose reference range applies only to samples taken after fasting for at least 8 hours.   BUN 12 6 - 20 mg/dL   Creatinine, Ser 8.29 0.44 - 1.00 mg/dL   Calcium 8.8 (L) 8.9 - 10.3 mg/dL   Total Protein 6.5 6.5 - 8.1 g/dL   Albumin 3.5 3.5 - 5.0 g/dL   AST 85 (H) 15 - 41 U/L   ALT 34 0 - 44 U/L   Alkaline Phosphatase 40 38 - 126 U/L   Total Bilirubin 1.1 0.3 - 1.2 mg/dL   GFR, Estimated >56 >21 mL/min    Comment: (NOTE) Calculated using the CKD-EPI  Creatinine Equation (2021)    Anion gap 17 (H) 5 - 15    Comment: Performed at [facility], [address] [place], [place] [address]  CK total and CKMB (cardiac)not at [facility]     Status: Abnormal   Collection Time: 03/31/23  9:48 PM  Result Value Ref Range   Total CK 3,472 (H) 38 - 234 U/L   CK, MB 59.2 (H) 0.5 - 5.0 ng/mL    Comment: Performed at [facility], [address] [place], [place] [address]  Glucose, capillary     Status: Abnormal   Collection Time: 03/31/23 11:28 PM  Result Value Ref Range   Glucose-Capillary 119 (H) 70 - 99 mg/dL    Comment: Glucose reference range applies only to samples taken after fasting for at least 8 hours.  Blood gas, venous     Status: Abnormal   Collection Time: 04/01/23 12:24 AM  Result Value Ref Range   pH, Ven 7.39 7.25 - 7.43   pCO2, Ven 38 (L) 44 - 60 mmHg   pO2, Ven 100 (H) 32 - 45 mmHg   Bicarbonate 23.0 20.0 - 28.0 mmol/L   Acid-base deficit 1.7 0.0 - 2.0 mmol/L   O2 Saturation 100 %   Patient temperature 37.2    Collection site RT HAND    Drawn by [id number]     Comment: Performed at [facility], [address], [place], [place] [address]  Rapid urine drug screen (hospital performed)     Status: Abnormal   Collection Time: 04/01/23 12:47 AM  Result Value Ref Range   Opiates NONE DETECTED NONE DETECTED   Cocaine NONE DETECTED NONE DETECTED   Benzodiazepines POSITIVE (A) NONE DETECTED   Amphetamines NONE DETECTED NONE DETECTED   Tetrahydrocannabinol NONE DETECTED NONE DETECTED   Barbiturates NONE DETECTED NONE DETECTED    Comment: (NOTE) DRUG SCREEN FOR MEDICAL PURPOSES ONLY.  IF  CONFIRMATION IS NEEDED FOR ANY PURPOSE, NOTIFY LAB WITHIN 5 DAYS.  LOWEST DETECTABLE LIMITS FOR URINE DRUG SCREEN Drug Class                     Cutoff (ng/mL) Amphetamine and metabolites    1000 Barbiturate and metabolites    200 Benzodiazepine                 200 Opiates and metabolites        300 Cocaine and metabolites         300 THC                            50 Performed at [facility], [address], [place], [place] [address]   Urinalysis, w/ Reflex to Culture (Infection Suspected) -Urine, Clean Catch     Status: Abnormal   Collection Time: 04/01/23 12:47 AM  Result Value Ref Range   Specimen Source URINE, CLEAN CATCH    Color, Urine YELLOW YELLOW   APPearance HAZY (A) CLEAR   Specific Gravity, Urine 1.027 1.005 - 1.030   pH 5.0 5.0 - 8.0   Glucose, UA NEGATIVE NEGATIVE mg/dL   Hgb urine dipstick LARGE (A) NEGATIVE   Bilirubin Urine NEGATIVE NEGATIVE   Ketones, ur NEGATIVE NEGATIVE mg/dL   Protein, ur >=621 (A) NEGATIVE mg/dL   Nitrite NEGATIVE NEGATIVE   Leukocytes,Ua NEGATIVE NEGATIVE   RBC / HPF 0-5 0 - 5 RBC/hpf   WBC, UA 21-50 0 - 5 WBC/hpf    Comment:        Reflex urine culture not performed if WBC <=10, OR if Squamous epithelial cells >5. If Squamous epithelial cells >5 suggest recollection.    Bacteria, UA RARE (A) NONE SEEN   Squamous Epithelial / HPF 0-5 0 - 5 /HPF   Mucus PRESENT    Hyaline Casts, UA PRESENT     Comment: Performed at [facility], [address], [place], [place] [address]  Urine Culture     Status: None   Collection Time: 04/01/23 12:47 AM   Specimen: Urine, Random  Result Value Ref Range   Specimen Description URINE, RANDOM    Special Requests NONE Reflexed from [id number]    Culture      NO GROWTH Performed at [facility], [address], [place], [place] [address]    Report Status 04/02/2023 FINAL   Glucose, capillary     Status: Abnormal   Collection Time: 04/01/23  3:46 AM  Result Value Ref Range   Glucose-Capillary 144 (H) 70 - 99 mg/dL    Comment: Glucose reference range applies only to samples taken after fasting for at least 8 hours.  I-STAT 7, (LYTES, BLD GAS, ICA, H+H)     Status: Abnormal   Collection Time: 04/01/23  6:30 AM  Result Value Ref Range   pH, Arterial 7.437 7.35 - 7.45   pCO2 arterial 30.1 (L) 32 - 48 mmHg    pO2, Arterial 103 83 - 108 mmHg   Bicarbonate 20.3 20.0 - 28.0 mmol/L   TCO2 21 (L) 22 - 32 mmol/L   O2 Saturation 98 %   Acid-base deficit 3.0 (H) 0.0 - 2.0 mmol/L   Sodium 141 135 - 145 mmol/L   Potassium 2.7 (LL) 3.5 - 5.1 mmol/L   Calcium, Ion 1.12 (L) 1.15 - 1.40 mmol/L   HCT 34.0 (L) 36.0 - 46.0 %  Hemoglobin 11.6 (L) 12.0 - 15.0 g/dL   Patient temperature 25.3 F    Collection site RADIAL, ALLEN'S TEST ACCEPTABLE    Drawn by RT    Sample type ARTERIAL    Comment NOTIFIED PHYSICIAN   Basic metabolic panel     Status: Abnormal   Collection Time: 04/01/23  6:34 AM  Result Value Ref Range   Sodium 141 135 - 145 mmol/L   Potassium 2.9 (L) 3.5 - 5.1 mmol/L   Chloride 107 98 - 111 mmol/L   CO2 21 (L) 22 - 32 mmol/L   Glucose, Bld 116 (H) 70 - 99 mg/dL    Comment: Glucose reference range applies only to samples taken after fasting for at least 8 hours.   BUN 12 6 - 20 mg/dL   Creatinine, Ser 6.64 0.44 - 1.00 mg/dL   Calcium 8.2 (L) 8.9 - 10.3 mg/dL   GFR, Estimated >40 >34 mL/min    Comment: (NOTE) Calculated using the CKD-EPI Creatinine Equation (2021)    Anion gap 13 5 - 15    Comment: Performed at [facility], [address], [place], [place] [address]  Magnesium     Status: None   Collection Time: 04/01/23  6:34 AM  Result Value Ref Range   Magnesium 1.7 1.7 - 2.4 mg/dL    Comment: Performed at [facility], [address] [place], [place] [address]  Phosphorus     Status: Abnormal   Collection Time: 04/01/23  6:34 AM  Result Value Ref Range   Phosphorus 2.3 (L) 2.5 - 4.6 mg/dL    Comment: Performed at [facility], [address] [place], [place] [address]  CK     Status: Abnormal   Collection Time: 04/01/23  6:34 AM  Result Value Ref Range   Total CK 3,951 (H) 38 - 234 U/L    Comment: Performed at [facility], [address] [place], [place] [address]  Glucose, capillary     Status: Abnormal   Collection Time: 04/01/23  7:35 AM   Result Value Ref Range   Glucose-Capillary 128 (H) 70 - 99 mg/dL    Comment: Glucose reference range applies only to samples taken after fasting for at least 8 hours.  CBC     Status: Abnormal   Collection Time: 04/01/23  7:43 AM  Result Value Ref Range   WBC 17.6 (H) 4.0 - 10.5 K/uL   RBC 3.65 (L) 3.87 - 5.11 MIL/uL   Hemoglobin 11.1 (L) 12.0 - 15.0 g/dL   HCT 84.1 (L) 66.0 - 63.0 %   MCV 89.0 80.0 - 100.0 fL   MCH 30.4 26.0 - 34.0 pg   MCHC 34.2 30.0 - 36.0 g/dL   RDW 16.0 10.9 - 32.3 %   Platelets 249 150 - 400 K/uL   nRBC 0.0 0.0 - 0.2 %    Comment: Performed at [facility], [address] [place], [place] [address]  Glucose, capillary     Status: Abnormal   Collection Time: 04/01/23 11:37 AM  Result Value Ref Range   Glucose-Capillary 105 (H) 70 - 99 mg/dL    Comment: Glucose reference range applies only to samples taken after fasting for at least 8 hours.  Glucose, capillary     Status: None   Collection Time: 04/01/23  4:15 PM  Result Value Ref Range   Glucose-Capillary 89 70 - 99 mg/dL    Comment: Glucose reference range applies only  to samples taken after fasting for at least 8 hours.  Glucose, capillary     Status: Abnormal   Collection Time: 04/01/23  7:50 PM  Result Value Ref Range   Glucose-Capillary 130 (H) 70 - 99 mg/dL    Comment: Glucose reference range applies only to samples taken after fasting for at least 8 hours.  Glucose, capillary     Status: None   Collection Time: 04/01/23 11:40 PM  Result Value Ref Range   Glucose-Capillary 98 70 - 99 mg/dL    Comment: Glucose reference range applies only to samples taken after fasting for at least 8 hours.  Glucose, capillary     Status: None   Collection Time: 04/02/23  3:40 AM  Result Value Ref Range   Glucose-Capillary 98 70 - 99 mg/dL    Comment: Glucose reference range applies only to samples taken after fasting for at least 8 hours.  Basic metabolic panel     Status: Abnormal   Collection Time:  04/02/23  3:58 AM  Result Value Ref Range   Sodium 139 135 - 145 mmol/L   Potassium 2.8 (L) 3.5 - 5.1 mmol/L   Chloride 102 98 - 111 mmol/L   CO2 23 22 - 32 mmol/L   Glucose, Bld 105 (H) 70 - 99 mg/dL    Comment: Glucose reference range applies only to samples taken after fasting for at least 8 hours.   BUN 5 (L) 6 - 20 mg/dL   Creatinine, Ser 3.66 0.44 - 1.00 mg/dL   Calcium 9.1 8.9 - 44.0 mg/dL   GFR, Estimated >34 >74 mL/min    Comment: (NOTE) Calculated using the CKD-EPI Creatinine Equation (2021)    Anion gap 14 5 - 15    Comment: Performed at [facility], [address], [place], [place] [address]  Magnesium     Status: None   Collection Time: 04/02/23  3:58 AM  Result Value Ref Range   Magnesium 1.8 1.7 - 2.4 mg/dL    Comment: Performed at [facility], [address] [place], [place] [address]  Phosphorus     Status: Abnormal   Collection Time: 04/02/23  3:58 AM  Result Value Ref Range   Phosphorus 1.9 (L) 2.5 - 4.6 mg/dL    Comment: Performed at [facility], [address] [place], [place] [address]  CBC     Status: Abnormal   Collection Time: 04/02/23  3:58 AM  Result Value Ref Range   WBC 16.5 (H) 4.0 - 10.5 K/uL   RBC 4.53 3.87 - 5.11 MIL/uL   Hemoglobin 13.2 12.0 - 15.0 g/dL   HCT 51.8 84.1 - 66.0 %   MCV 88.3 80.0 - 100.0 fL   MCH 29.1 26.0 - 34.0 pg   MCHC 33.0 30.0 - 36.0 g/dL   RDW 63.0 16.0 - 10.9 %   Platelets 261 150 - 400 K/uL   nRBC 0.0 0.0 - 0.2 %    Comment: Performed at [facility], [address] [place], [place] [address]  Glucose, capillary     Status: Abnormal   Collection Time: 04/02/23  8:04 AM  Result Value Ref Range   Glucose-Capillary 135 (H) 70 - 99 mg/dL    Comment: Glucose reference range applies only to samples taken after fasting for at least 8 hours.  Glucose, capillary     Status: Abnormal   Collection Time: 04/02/23 11:10 AM  Result Value Ref Range   Glucose-Capillary  125 (H) 70 - 99 mg/dL     Comment: Glucose reference range applies only to samples taken after fasting for at least 8 hours.  Glucose, capillary     Status: Abnormal   Collection Time: 04/02/23  4:02 PM  Result Value Ref Range   Glucose-Capillary 148 (H) 70 - 99 mg/dL    Comment: Glucose reference range applies only to samples taken after fasting for at least 8 hours.  Glucose, capillary     Status: Abnormal   Collection Time: 04/02/23  9:30 PM  Result Value Ref Range   Glucose-Capillary 115 (H) 70 - 99 mg/dL    Comment: Glucose reference range applies only to samples taken after fasting for at least 8 hours.   Comment 1 Notify RN    Comment 2 Document in Chart   Basic metabolic panel     Status: Abnormal   Collection Time: 04/03/23  6:47 AM  Result Value Ref Range   Sodium 140 135 - 145 mmol/L   Potassium 3.2 (L) 3.5 - 5.1 mmol/L   Chloride 107 98 - 111 mmol/L   CO2 23 22 - 32 mmol/L   Glucose, Bld 108 (H) 70 - 99 mg/dL    Comment: Glucose reference range applies only to samples taken after fasting for at least 8 hours.   BUN <5 (L) 6 - 20 mg/dL   Creatinine, Ser 6.30 0.44 - 1.00 mg/dL   Calcium 8.6 (L) 8.9 - 10.3 mg/dL   GFR, Estimated >16 >01 mL/min    Comment: (NOTE) Calculated using the CKD-EPI Creatinine Equation (2021)    Anion gap 10 5 - 15    Comment: Performed at [facility], [address] [place], [place] [address]  CBC     Status: Abnormal   Collection Time: 04/03/23  6:47 AM  Result Value Ref Range   WBC 12.1 (H) 4.0 - 10.5 K/uL   RBC 3.94 3.87 - 5.11 MIL/uL   Hemoglobin 11.4 (L) 12.0 - 15.0 g/dL   HCT 55.7 (L) 32.2 - 02.5 %   MCV 89.8 80.0 - 100.0 fL   MCH 28.9 26.0 - 34.0 pg   MCHC 32.2 30.0 - 36.0 g/dL   RDW 42.7 06.2 - 37.6 %   Platelets 230 150 - 400 K/uL   nRBC 0.0 0.0 - 0.2 %    Comment: Performed at [facility], [address] [place], [place] [address]  CK     Status: Abnormal   Collection Time: 04/03/23  6:47 AM  Result Value Ref Range   Total CK 9,175  (H) 38 - 234 U/L    Comment: RESULT CONFIRMED BY MANUAL DILUTION Performed at [facility], [address], [place], [place] [address]   Glucose, capillary     Status: Abnormal   Collection Time: 04/03/23  8:41 AM  Result Value Ref Range   Glucose-Capillary 142 (H) 70 - 99 mg/dL    Comment: Glucose reference range applies only to samples taken after fasting for at least 8 hours.  Glucose, capillary     Status: None   Collection Time: 04/03/23 11:29 AM  Result Value Ref Range   Glucose-Capillary 98 70 - 99 mg/dL    Comment: Glucose reference range applies only to samples taken after fasting for at least 8 hours.  Glucose, capillary     Status: Abnormal   Collection Time: 04/03/23 12:35 PM  Result Value Ref Range   Glucose-Capillary 169 (H) 70 -  99 mg/dL    Comment: Glucose reference range applies only to samples taken after fasting for at least 8 hours.  Glucose, capillary     Status: Abnormal   Collection Time: 04/03/23  4:28 PM  Result Value Ref Range   Glucose-Capillary 129 (H) 70 - 99 mg/dL    Comment: Glucose reference range applies only to samples taken after fasting for at least 8 hours.  Glucose, capillary     Status: Abnormal   Collection Time: 04/03/23  9:26 PM  Result Value Ref Range   Glucose-Capillary 117 (H) 70 - 99 mg/dL    Comment: Glucose reference range applies only to samples taken after fasting for at least 8 hours.  Basic metabolic panel     Status: Abnormal   Collection Time: 04/03/23 11:32 PM  Result Value Ref Range   Sodium 141 135 - 145 mmol/L   Potassium 3.5 3.5 - 5.1 mmol/L   Chloride 105 98 - 111 mmol/L   CO2 23 22 - 32 mmol/L   Glucose, Bld 104 (H) 70 - 99 mg/dL    Comment: Glucose reference range applies only to samples taken after fasting for at least 8 hours.   BUN 5 (L) 6 - 20 mg/dL   Creatinine, Ser 6.29 0.44 - 1.00 mg/dL   Calcium 8.6 (L) 8.9 - 10.3 mg/dL   GFR, Estimated >52 >84 mL/min    Comment: (NOTE) Calculated using the CKD-EPI  Creatinine Equation (2021)    Anion gap 13 5 - 15    Comment: Performed at [facility], [address] [place], [place] [address]  CK     Status: Abnormal   Collection Time: 04/03/23 11:32 PM  Result Value Ref Range   Total CK 11,408 (H) 38 - 234 U/L    Comment: RESULT CONFIRMED BY MANUAL DILUTION Performed at [facility], [address], [place], [place] [address]   Glucose, capillary     Status: None   Collection Time: 04/04/23  8:17 AM  Result Value Ref Range   Glucose-Capillary 94 70 - 99 mg/dL    Comment: Glucose reference range applies only to samples taken after fasting for at least 8 hours.  Glucose, capillary     Status: Abnormal   Collection Time: 04/04/23 12:29 PM  Result Value Ref Range   Glucose-Capillary 153 (H) 70 - 99 mg/dL    Comment: Glucose reference range applies only to samples taken after fasting for at least 8 hours.  Glucose, capillary     Status: Abnormal   Collection Time: 04/04/23  5:20 PM  Result Value Ref Range   Glucose-Capillary 100 (H) 70 - 99 mg/dL    Comment: Glucose reference range applies only to samples taken after fasting for at least 8 hours.  Glucose, capillary     Status: None   Collection Time: 04/04/23  7:16 PM  Result Value Ref Range   Glucose-Capillary 90 70 - 99 mg/dL    Comment: Glucose reference range applies only to samples taken after fasting for at least 8 hours.  Basic metabolic panel     Status: Abnormal   Collection Time: 04/04/23 11:35 PM  Result Value Ref Range   Sodium 141 135 - 145 mmol/L   Potassium 3.3 (L) 3.5 - 5.1 mmol/L   Chloride 107 98 - 111 mmol/L   CO2 20 (L) 22 - 32 mmol/L   Glucose, Bld 100 (H) 70 - 99 mg/dL    Comment: Glucose reference range  applies only to samples taken after fasting for at least 8 hours.   BUN <5 (L) 6 - 20 mg/dL   Creatinine, Ser 6.64 0.44 - 1.00 mg/dL   Calcium 9.2 8.9 - 40.3 mg/dL   GFR, Estimated >47 >42 mL/min    Comment: (NOTE) Calculated using the CKD-EPI  Creatinine Equation (2021)    Anion gap 14 5 - 15    Comment: Performed at [facility], [address] [place], [place] [address]  CK     Status: Abnormal   Collection Time: 04/04/23 11:35 PM  Result Value Ref Range   Total CK 8,050 (H) 38 - 234 U/L    Comment: RESULTS CONFIRMED BY MANUAL DILUTION Performed at [facility], [address] [place], [place] [address]   Glucose, capillary     Status: None   Collection Time: 04/05/23  7:42 AM  Result Value Ref Range   Glucose-Capillary 94 70 - 99 mg/dL    Comment: Glucose reference range applies only to samples taken after fasting for at least 8 hours.  CK     Status: Abnormal   Collection Time: 04/05/23  8:47 AM  Result Value Ref Range   Total CK 5,991 (H) 38 - 234 U/L    Comment: RESULT CONFIRMED BY MANUAL DILUTION Performed at [facility], [address] [place], [place] [address]   Glucose, capillary     Status: None   Collection Time: 04/05/23 12:18 PM  Result Value Ref Range   Glucose-Capillary 86 70 - 99 mg/dL    Comment: Glucose reference range applies only to samples taken after fasting for at least 8 hours.  CBC with Differential     Status: Abnormal   Collection Time: 04/10/23  9:40 AM  Result Value Ref Range   WBC 13.9 (H) 4.0 - 10.5 K/uL   RBC 4.16 3.87 - 5.11 MIL/uL   Hemoglobin 12.1 12.0 - 15.0 g/dL   HCT 18.8 41.6 - 60.6 %   MCV 89.9 80.0 - 100.0 fL   MCH 29.1 26.0 - 34.0 pg   MCHC 32.4 30.0 - 36.0 g/dL   RDW 30.1 60.1 - 09.3 %   Platelets 352 150 - 400 K/uL   nRBC 0.0 0.0 - 0.2 %   Neutrophils Relative % 76 %   Neutro Abs 10.6 (H) 1.7 - 7.7 K/uL   Lymphocytes Relative 17 %   Lymphs Abs 2.4 0.7 - 4.0 K/uL   Monocytes Relative 5 %   Monocytes Absolute 0.7 0.1 - 1.0 K/uL   Eosinophils Relative 1 %   Eosinophils Absolute 0.1 0.0 - 0.5 K/uL   Basophils Relative 0 %   Basophils Absolute 0.0 0.0 - 0.1 K/uL   Immature Granulocytes 1 %   Abs Immature Granulocytes 0.10 (H) 0.00 - 0.07 K/uL     Comment: Performed at [facility], [address], [place], [place] [address]  Comprehensive metabolic panel     Status: Abnormal   Collection Time: 04/10/23  9:40 AM  Result Value Ref Range   Sodium 140 135 - 145 mmol/L   Potassium 2.8 (L) 3.5 - 5.1 mmol/L   Chloride 103 98 - 111 mmol/L   CO2 25 22 - 32 mmol/L   Glucose, Bld 123 (H) 70 - 99 mg/dL    Comment: Glucose reference range applies only to samples taken after fasting for at least 8 hours.   BUN 5 (L) 6 - 20 mg/dL  Creatinine, Ser 0.77 0.44 - 1.00 mg/dL   Calcium 9.9 8.9 - 16.1 mg/dL   Total Protein 6.8 6.5 - 8.1 g/dL   Albumin 3.6 3.5 - 5.0 g/dL   AST 38 15 - 41 U/L   ALT 49 (H) 0 - 44 U/L   Alkaline Phosphatase 39 38 - 126 U/L   Total Bilirubin 1.0 0.3 - 1.2 mg/dL   GFR, Estimated >09 >60 mL/min    Comment: (NOTE) Calculated using the CKD-EPI Creatinine Equation (2021)    Anion gap 12 5 - 15    Comment: Performed at [facility], [address], [place], [place] [address]  Magnesium     Status: Abnormal   Collection Time: 04/10/23  9:40 AM  Result Value Ref Range   Magnesium <0.5 (LL) 1.7 - 2.4 mg/dL    Comment: REPEATED TO VERIFY CRITICAL RESULT CALLED TO, READ BACK BY AND VERIFIED WITH P.PULLIAM,RN 1026 04/10/23 CLARK,S Performed at [facility], [address], [place], [place] [address]   Phosphorus     Status: None   Collection Time: 04/10/23  9:40 AM  Result Value Ref Range   Phosphorus 3.0 2.5 - 4.6 mg/dL    Comment: Performed at [facility], [address], [place], [place] [address]  Hemoglobin A1c     Status: None   Collection Time: 04/10/23  9:40 AM  Result Value Ref Range   Hgb A1c MFr Bld 5.3 4.8 - 5.6 %    Comment: (NOTE) Pre diabetes:          5.7%-6.4%  Diabetes:              >6.4%  Glycemic control for   <7.0% adults with diabetes    Mean Plasma Glucose 105.41 mg/dL    Comment: Performed at [facility], [address] [place], [place] [address]  Basic  metabolic panel     Status: Abnormal   Collection Time: 04/10/23  2:39 PM  Result Value Ref Range   Sodium 142 135 - 145 mmol/L   Potassium 3.6 3.5 - 5.1 mmol/L   Chloride 104 98 - 111 mmol/L   CO2 25 22 - 32 mmol/L   Glucose, Bld 100 (H) 70 - 99 mg/dL    Comment: Glucose reference range applies only to samples taken after fasting for at least 8 hours.   BUN <5 (L) 6 - 20 mg/dL   Creatinine, Ser 0.86 0.44 - 1.00 mg/dL   Calcium 9.4 8.9 - 57.8 mg/dL   GFR, Estimated >46 >96 mL/min    Comment: (NOTE) Calculated using the CKD-EPI Creatinine Equation (2021)    Anion gap 13 5 - 15    Comment: Performed at [facility], [address] [place], [place] [address]  Glucose, capillary     Status: Abnormal   Collection Time: 04/10/23  4:59 PM  Result Value Ref Range   Glucose-Capillary 126 (H) 70 - 99 mg/dL    Comment: Glucose reference range applies only to samples taken after fasting for at least 8 hours.  HIV Antibody (routine testing w rflx)     Status: None   Collection Time: 04/11/23  7:15 AM  Result Value Ref Range   HIV Screen 4th Generation wRfx Non Reactive Non Reactive    Comment: Performed at [facility], [address], [place], [place] [address]  Magnesium     Status: Abnormal   Collection Time: 04/11/23  7:15 AM  Result Value Ref Range   Magnesium 1.0 (L) 1.7 - 2.4 mg/dL    Comment: Performed at [facility], [address] [place], [place] [address]  Comprehensive metabolic panel     Status: Abnormal   Collection Time: 04/11/23  7:15 AM  Result Value Ref Range   Sodium 138 135 - 145 mmol/L   Potassium 3.7 3.5 - 5.1 mmol/L   Chloride 104 98 - 111 mmol/L   CO2 23 22 - 32 mmol/L   Glucose, Bld 109 (H) 70 - 99 mg/dL    Comment: Glucose reference range applies only to samples taken after fasting for at least 8 hours.   BUN <5 (L) 6 - 20 mg/dL   Creatinine, Ser 6.04 0.44 - 1.00 mg/dL   Calcium 9.6 8.9 - 54.0 mg/dL   Total Protein 7.2 6.5 - 8.1 g/dL    Albumin 3.6 3.5 - 5.0 g/dL   AST 37 15 - 41 U/L   ALT 43 0 - 44 U/L   Alkaline Phosphatase 50 38 - 126 U/L   Total Bilirubin 0.9 0.3 - 1.2 mg/dL   GFR, Estimated >98 >11 mL/min    Comment: (NOTE) Calculated using the CKD-EPI Creatinine Equation (2021)    Anion gap 11 5 - 15    Comment: Performed at [facility], [address], [place], [place] [address]  CBC     Status: None   Collection Time: 04/11/23  7:15 AM  Result Value Ref Range   WBC 8.9 4.0 - 10.5 K/uL   RBC 4.48 3.87 - 5.11 MIL/uL   Hemoglobin 13.0 12.0 - 15.0 g/dL   HCT 29.5 62.1 - 30.8 %   MCV 89.1 80.0 - 100.0 fL   MCH 29.0 26.0 - 34.0 pg   MCHC 32.6 30.0 - 36.0 g/dL   RDW 65.7 84.6 - 96.2 %   Platelets 381 150 - 400 K/uL   nRBC 0.0 0.0 - 0.2 %    Comment: Performed at [facility], [address] [place], [place] [address]  T4, free     Status: None   Collection Time: 04/11/23  7:15 AM  Result Value Ref Range   Free T4 0.92 0.61 - 1.12 ng/dL    Comment: (NOTE) Biotin ingestion may interfere with free T4 tests. If the results are inconsistent with the TSH level, previous test results, or the clinical presentation, then consider biotin interference. If needed, order repeat testing after stopping biotin. Performed at [facility], [address] [place], [place] [address]   C-reactive protein     Status: None   Collection Time: 04/11/23  7:15 AM  Result Value Ref Range   CRP 0.6 <1.0 mg/dL    Comment: Performed at [facility], [address] [place], [place] [address]  Procalcitonin     Status: None   Collection Time: 04/11/23  7:15 AM  Result Value Ref Range   Procalcitonin <0.10 ng/mL    Comment:        Interpretation: PCT (Procalcitonin) <= 0.5 ng/mL: Systemic infection (sepsis) is not likely. Local bacterial infection is possible. (NOTE)       Sepsis PCT Algorithm           Lower Respiratory Tract  Infection PCT Algorithm     ----------------------------     ----------------------------         PCT < 0.25 ng/mL                PCT < 0.10 ng/mL          Strongly encourage             Strongly discourage   discontinuation of antibiotics    initiation of antibiotics    ----------------------------     -----------------------------       PCT 0.25 - 0.50 ng/mL            PCT 0.10 - 0.25 ng/mL               OR       >80% decrease in PCT            Discourage initiation of                                            antibiotics      Encourage discontinuation           of antibiotics    ----------------------------     -----------------------------         PCT >= 0.50 ng/mL              PCT 0.26 - 0.50 ng/mL               AND        <80% decrease in PCT             Encourage initiation of                                             antibiotics       Encourage continuation           of antibiotics    ----------------------------     -----------------------------        PCT >= 0.50 ng/mL                  PCT > 0.50 ng/mL               AND         increase in PCT                  Strongly encourage                                      initiation of antibiotics    Strongly encourage escalation           of antibiotics                                     -----------------------------                                           PCT <= 0.25 ng/mL  OR                                        > 80% decrease in PCT                                      Discontinue / Do not initiate                                             antibiotics  Performed at [facility], [address], [place], [place] [address]   TSH     Status: None   Collection Time: 04/11/23  7:22 AM  Result Value Ref Range   TSH 1.083 0.350 - 4.500 uIU/mL    Comment: Performed by a 3rd Generation assay with a functional sensitivity of <=0.01 uIU/mL. Performed at [facility], [address],  [place], [place] [address]   Glucose, capillary     Status: Abnormal   Collection Time: 04/11/23  8:22 AM  Result Value Ref Range   Glucose-Capillary 148 (H) 70 - 99 mg/dL    Comment: Glucose reference range applies only to samples taken after fasting for at least 8 hours.  Urinalysis, w/ Reflex to Culture (Infection Suspected) -Urine, Clean Catch     Status: Abnormal   Collection Time: 04/11/23 11:05 AM  Result Value Ref Range   Specimen Source URINE, CLEAN CATCH    Color, Urine STRAW (A) YELLOW   APPearance CLEAR CLEAR   Specific Gravity, Urine 1.015 1.005 - 1.030   pH 7.0 5.0 - 8.0   Glucose, UA NEGATIVE NEGATIVE mg/dL   Hgb urine dipstick NEGATIVE NEGATIVE   Bilirubin Urine NEGATIVE NEGATIVE   Ketones, ur NEGATIVE NEGATIVE mg/dL   Protein, ur NEGATIVE NEGATIVE mg/dL   Nitrite NEGATIVE NEGATIVE   Leukocytes,Ua NEGATIVE NEGATIVE   RBC / HPF 0-5 0 - 5 RBC/hpf   WBC, UA 0-5 0 - 5 WBC/hpf    Comment:        Reflex urine culture not performed if WBC <=10, OR if Squamous epithelial cells >5. If Squamous epithelial cells >5 suggest recollection.    Bacteria, UA NONE SEEN NONE SEEN   Squamous Epithelial / HPF 0-5 0 - 5 /HPF    Comment: Performed at [facility], [address], [place], [place] [address]  Glucose, capillary     Status: None   Collection Time: 04/11/23 11:34 AM  Result Value Ref Range   Glucose-Capillary 91 70 - 99 mg/dL    Comment: Glucose reference range applies only to samples taken after fasting for at least 8 hours.  Glucose, capillary     Status: None   Collection Time: 04/11/23  5:04 PM  Result Value Ref Range   Glucose-Capillary 80 70 - 99 mg/dL    Comment: Glucose reference range applies only to samples taken after fasting for at least 8 hours.  Glucose, capillary     Status: Abnormal   Collection Time: 04/11/23  9:02 PM  Result Value Ref Range   Glucose-Capillary 105 (H) 70 - 99 mg/dL    Comment: Glucose reference range applies only to samples taken  after fasting for at  least 8 hours.  CBC with Differential/Platelet     Status: Abnormal   Collection Time: 04/12/23 12:46 AM  Result Value Ref Range   WBC 9.8 4.0 - 10.5 K/uL   RBC 4.14 3.87 - 5.11 MIL/uL   Hemoglobin 12.5 12.0 - 15.0 g/dL   HCT 40.9 81.1 - 91.4 %   MCV 91.8 80.0 - 100.0 fL   MCH 30.2 26.0 - 34.0 pg   MCHC 32.9 30.0 - 36.0 g/dL   RDW 78.2 95.6 - 21.3 %   Platelets 421 (H) 150 - 400 K/uL   nRBC 0.0 0.0 - 0.2 %   Neutrophils Relative % 62 %   Neutro Abs 6.0 1.7 - 7.7 K/uL   Lymphocytes Relative 32 %   Lymphs Abs 3.2 0.7 - 4.0 K/uL   Monocytes Relative 5 %   Monocytes Absolute 0.5 0.1 - 1.0 K/uL   Eosinophils Relative 1 %   Eosinophils Absolute 0.1 0.0 - 0.5 K/uL   Basophils Relative 0 %   Basophils Absolute 0.0 0.0 - 0.1 K/uL   Immature Granulocytes 0 %   Abs Immature Granulocytes 0.04 0.00 - 0.07 K/uL    Comment: Performed at [facility], [address], [place], [place] [address]  Brain natriuretic peptide     Status: Abnormal   Collection Time: 04/12/23 12:46 AM  Result Value Ref Range   B Natriuretic Peptide 181.5 (H) 0.0 - 100.0 pg/mL    Comment: Performed at [facility], [address] [place], [place] [address]  Comprehensive metabolic panel     Status: Abnormal   Collection Time: 04/12/23 12:46 AM  Result Value Ref Range   Sodium 136 135 - 145 mmol/L   Potassium 3.4 (L) 3.5 - 5.1 mmol/L   Chloride 101 98 - 111 mmol/L   CO2 22 22 - 32 mmol/L   Glucose, Bld 104 (H) 70 - 99 mg/dL    Comment: Glucose reference range applies only to samples taken after fasting for at least 8 hours.   BUN 6 6 - 20 mg/dL   Creatinine, Ser 2.95 0.44 - 1.00 mg/dL   Calcium 9.2 8.9 - 28.4 mg/dL   Total Protein 6.8 6.5 - 8.1 g/dL   Albumin 3.7 3.5 - 5.0 g/dL   AST 28 15 - 41 U/L   ALT 39 0 - 44 U/L   Alkaline Phosphatase 45 38 - 126 U/L   Total Bilirubin 0.9 0.3 - 1.2 mg/dL   GFR, Estimated >13 >24 mL/min    Comment: (NOTE) Calculated using the CKD-EPI  Creatinine Equation (2021)    Anion gap 13 5 - 15    Comment: Performed at [facility], [address], [place], [place] [address]  C-reactive protein     Status: None   Collection Time: 04/12/23 12:46 AM  Result Value Ref Range   CRP <0.5 <1.0 mg/dL    Comment: Performed at [facility], [address] [place], [place] [address]  Procalcitonin     Status: None   Collection Time: 04/12/23 12:46 AM  Result Value Ref Range   Procalcitonin <0.10 ng/mL    Comment:        Interpretation: PCT (Procalcitonin) <= 0.5 ng/mL: Systemic infection (sepsis) is not likely. Local bacterial infection is possible. (NOTE)       Sepsis PCT Algorithm           Lower Respiratory Tract  Infection PCT Algorithm    ----------------------------     ----------------------------         PCT < 0.25 ng/mL                PCT < 0.10 ng/mL          Strongly encourage             Strongly discourage   discontinuation of antibiotics    initiation of antibiotics    ----------------------------     -----------------------------       PCT 0.25 - 0.50 ng/mL            PCT 0.10 - 0.25 ng/mL               OR       >80% decrease in PCT            Discourage initiation of                                            antibiotics      Encourage discontinuation           of antibiotics    ----------------------------     -----------------------------         PCT >= 0.50 ng/mL              PCT 0.26 - 0.50 ng/mL               AND        <80% decrease in PCT             Encourage initiation of                                             antibiotics       Encourage continuation           of antibiotics    ----------------------------     -----------------------------        PCT >= 0.50 ng/mL                  PCT > 0.50 ng/mL               AND         increase in PCT                  Strongly encourage                                      initiation of antibiotics     Strongly encourage escalation           of antibiotics                                     -----------------------------                                           PCT <= 0.25 ng/mL  OR                                        > 80% decrease in PCT                                      Discontinue / Do not initiate                                             antibiotics  Performed at [facility], [address], [place], [place] [address]   Magnesium     Status: None   Collection Time: 04/12/23 12:46 AM  Result Value Ref Range   Magnesium 1.8 1.7 - 2.4 mg/dL    Comment: Performed at [facility], [address], [place], [place] [address]  Glucose, capillary     Status: Abnormal   Collection Time: 04/12/23  8:15 AM  Result Value Ref Range   Glucose-Capillary 107 (H) 70 - 99 mg/dL    Comment: Glucose reference range applies only to samples taken after fasting for at least 8 hours.  Troponin I (High Sensitivity)     Status: None   Collection Time: 04/12/23 10:20 AM  Result Value Ref Range   Troponin I (High Sensitivity) 12 <18 ng/L    Comment: (NOTE) Elevated high sensitivity troponin I (hsTnI) values and significant  changes across serial measurements may suggest ACS but many other  chronic and acute conditions are known to elevate hsTnI results.  Refer to the "Links" section for chest pain algorithms and additional  guidance. Performed at [facility], [address] [place], [place] [address]   Glucose, capillary     Status: None   Collection Time: 04/12/23  1:21 PM  Result Value Ref Range   Glucose-Capillary 86 70 - 99 mg/dL    Comment: Glucose reference range applies only to samples taken after fasting for at least 8 hours.  Glucose, capillary     Status: Abnormal   Collection Time: 04/12/23  3:29 PM  Result Value Ref Range   Glucose-Capillary 125 (H) 70 - 99 mg/dL    Comment: Glucose reference range  applies only to samples taken after fasting for at least 8 hours.  ECHOCARDIOGRAM COMPLETE     Status: None   Collection Time: 04/12/23  4:01 PM  Result Value Ref Range   Weight 3,086.44 oz   Height 65.5 in   BP 137/93 mmHg   S' Lateral 3.60 cm   AR max vel 3.48 cm2   AV Peak grad 5.7 mmHg   Ao pk vel 1.19 m/s   Area-P 1/2 3.85 cm2   MV M vel 1.15 m/s   MV Peak grad 5.3 mmHg   Est EF 55 - 60%   Basic Metabolic Panel     Status: Abnormal   Collection Time: 04/14/23 12:07 PM  Result Value Ref Range   Glucose 90 70 - 99 mg/dL   BUN 7 6 - 24 mg/dL   Creatinine, Ser 9.14 0.57 - 1.00 mg/dL   eGFR 782 >95 AO/ZHY/8.65   BUN/Creatinine Ratio 10 9 - 23   Sodium 140 134 - 144 mmol/L  Potassium 4.4 3.5 - 5.2 mmol/L   Chloride 100 96 - 106 mmol/L   CO2 19 (L) 20 - 29 mmol/L   Calcium 10.9 (H) 8.7 - 10.2 mg/dL  Magnesium     Status: Abnormal   Collection Time: 04/14/23 12:07 PM  Result Value Ref Range   Magnesium 0.9 (LL) 1.6 - 2.3 mg/dL  [id number]     Status: None   Collection Time: 04/14/23 12:07 PM  Result Value Ref Range   TSH 2.170 0.450 - 4.500 uIU/mL   T3, Free 3.1 2.0 - 4.4 pg/mL   Free T4 1.17 0.82 - 1.77 ng/dL  CK     Status: None   Collection Time: 04/14/23 12:07 PM  Result Value Ref Range   Total CK 172 32 - 182 U/L  PTH, Intact and Calcium     Status: Abnormal   Collection Time: 04/21/23  2:00 PM  Result Value Ref Range   Calcium 10.5 (H) 8.7 - 10.2 mg/dL   PTH 18 15 - 65 pg/mL   PTH Interp Comment     Comment: Interpretation                 Intact PTH    Calcium                                 (pg/mL)      (mg/dL) Normal                          15 - 65     8.6 - 10.2 Primary Hyperparathyroidism         >65          >10.2 Secondary Hyperparathyroidism       >65          <10.2 Non-Parathyroid Hypercalcemia       <65          >10.2 Hypoparathyroidism                  <15          < 8.6 Non-Parathyroid Hypocalcemia    15 - 65          < 8.6   VITAMIN D 25  Hydroxy (Vit-D Deficiency, Fractures)     Status: None   Collection Time: 04/21/23  2:00 PM  Result Value Ref Range   Vit D, 25-Hydroxy 30.7 30.0 - 100.0 ng/mL    Comment: Vitamin D deficiency has been defined by the Institute of Medicine and an Endocrine Society practice guideline as a level of serum 25-OH vitamin D less than 20 ng/mL (1,2). The Endocrine Society went on to further define vitamin D insufficiency as a level between 21 and 29 ng/mL (2). 1. IOM (Institute of Medicine). 2010. Dietary reference    intakes for calcium and D. [place]: The    [employer]. 2. Holick MF, Binkley [place], Bischoff-Ferrari HA, et al.    Evaluation, treatment, and prevention of vitamin D    deficiency: an Endocrine Society clinical practice    guideline. JCEM. 2011 Jul; 96(7):1911-30.   Magnesium     Status: Abnormal   Collection Time: 04/21/23  2:00 PM  Result Value Ref Range   Magnesium 0.6 (LL) 1.6 - 2.3 mg/dL     Psychiatric Specialty Exam: Physical Exam  Review of Systems  Constitutional:  Positive for activity change, appetite change  and fatigue.  Gastrointestinal:  Positive for nausea.  Neurological:  Positive for headaches.  Psychiatric/Behavioral:  Positive for dysphoric mood and sleep disturbance. The patient is nervous/anxious.     Weight 183 lb (83 kg).There is no height or weight on file to calculate BMI.  General Appearance: Fairly Groomed  Eye Contact:  Fair  Speech:  Slow  Volume:  Decreased  Mood:  Anxious, Depressed, and Dysphoric  Affect:  Constricted and Depressed  Thought Process:  Descriptions of Associations: Intact  Orientation:  Full (Time, Place, and Person)  Thought Content:  Rumination  Suicidal Thoughts:  No  Homicidal Thoughts:  No  Memory:  Immediate;   Fair Recent;   Fair Remote;   Fair  Judgement:  Intact  Insight:  Present  Psychomotor Activity:  Decreased  Concentration:  Concentration: Fair and Attention Span: Fair  Recall:   [employer] of Knowledge:  Fair  Language:  Good  Akathisia:  No  Handed:  Right  AIMS (if indicated):     Assets:  Communication Skills Desire for Improvement Housing Social Support  ADL's:  Intact  Cognition:  WNL  Sleep:  fair     Assessment/Plan: Bipolar 1 disorder (HCC) - Plan: FLUoxetine (PROZAC) 10 MG capsule, lamoTRIgine (LAMICTAL) 25 MG tablet  GAD (generalized anxiety disorder) - Plan: FLUoxetine (PROZAC) 10 MG capsule, LORazepam (ATIVAN) 1 MG tablet  I reviewed her chart, blood work results, current medication.  Patient has multiple health issues and recently had abnormal EKG and seizures.  She is seeing neurology, nephrology and cardiology.  Discussed low mood and dysphoria as not able to drive and not able to work.  She discussed about applying for disability.  She had disability before due to her mental health reason but she was able to go back to work.  I do believe she should apply again for disability because of mental health reasons and also for multiple health issues.  Recently diagnosed with seizures and persistent nausea, headaches and extreme fatigue.  She is taking Ativan 1 mg up to 3 times a day and Lamictal 50 mg daily along with Prozac 30 mg.  I recommend to try Lamictal 75 mg for 2 weeks and then 100 mg daily.  So far she has no rash, itching, tremors or shakes.  Encouraged to keep appointment with primary care to discuss about disability for medical reason and I will also support the decision for psychiatric reason.  Reminded that Lamictal can cause some time rash and in that case she needed to stop the medication immediately.  Reassurance given.  We will follow-up in 4 to 6 weeks and further optimize Lamictal in the future.    Follow Up Instructions:     I discussed the assessment and treatment plan with the patient. The patient was provided an opportunity to ask questions and all were answered. The patient agreed with the plan and demonstrated an understanding of  the instructions.   The patient was advised to call back or seek an in-person evaluation if the symptoms worsen or if the condition fails to improve as anticipated.    Collaboration of Care: Other provider involved in patient's care AEB notes are available in epic to review.  I will also forward my note to primary care  Patient/Guardian was advised Release of Information must be obtained prior to any record release in order to collaborate their care with an outside provider. Patient/Guardian was advised if they have not already done so to contact the  registration department to sign all necessary forms in order for [place] to release information regarding their care.   Consent: Patient/Guardian gives verbal consent for treatment and assignment of benefits for services provided during this visit. Patient/Guardian expressed understanding and agreed to proceed.     I provided 31 minutes of non face to face time during this encounter.  Note: This document was prepared by [employer] voice dictation technology and any errors that results from this process are unintentional.    Cleotis Nipper, MD 05/11/2023

## 2023-05-12 ENCOUNTER — Encounter: Payer: Self-pay | Admitting: Neurology

## 2023-05-12 ENCOUNTER — Encounter: Payer: Self-pay | Admitting: Internal Medicine

## 2023-05-15 ENCOUNTER — Encounter: Payer: Self-pay | Admitting: Internal Medicine

## 2023-05-15 ENCOUNTER — Ambulatory Visit: Payer: PRIVATE HEALTH INSURANCE | Attending: Internal Medicine | Admitting: Physical Therapy

## 2023-05-15 ENCOUNTER — Inpatient Hospital Stay: Payer: PRIVATE HEALTH INSURANCE | Attending: Hematology and Oncology | Admitting: Hematology and Oncology

## 2023-05-15 ENCOUNTER — Other Ambulatory Visit: Payer: Self-pay

## 2023-05-15 ENCOUNTER — Encounter: Payer: Self-pay | Admitting: Neurology

## 2023-05-15 ENCOUNTER — Encounter: Payer: Self-pay | Admitting: Physical Therapy

## 2023-05-15 VITALS — BP 126/84 | HR 70 | Temp 97.4°F | Resp 18 | Ht 65.0 in | Wt 178.4 lb

## 2023-05-15 DIAGNOSIS — Z79811 Long term (current) use of aromatase inhibitors: Secondary | ICD-10-CM | POA: Insufficient documentation

## 2023-05-15 DIAGNOSIS — N951 Menopausal and female climacteric states: Secondary | ICD-10-CM | POA: Insufficient documentation

## 2023-05-15 DIAGNOSIS — R2681 Unsteadiness on feet: Secondary | ICD-10-CM | POA: Diagnosis present

## 2023-05-15 DIAGNOSIS — R5383 Other fatigue: Secondary | ICD-10-CM | POA: Diagnosis not present

## 2023-05-15 DIAGNOSIS — Z170 Estrogen receptor positive status [ER+]: Secondary | ICD-10-CM | POA: Insufficient documentation

## 2023-05-15 DIAGNOSIS — M25512 Pain in left shoulder: Secondary | ICD-10-CM | POA: Diagnosis present

## 2023-05-15 DIAGNOSIS — M6281 Muscle weakness (generalized): Secondary | ICD-10-CM | POA: Diagnosis present

## 2023-05-15 DIAGNOSIS — F32A Depression, unspecified: Secondary | ICD-10-CM | POA: Diagnosis not present

## 2023-05-15 DIAGNOSIS — R2689 Other abnormalities of gait and mobility: Secondary | ICD-10-CM | POA: Insufficient documentation

## 2023-05-15 DIAGNOSIS — T733XXA Exhaustion due to excessive exertion, initial encounter: Secondary | ICD-10-CM

## 2023-05-15 DIAGNOSIS — C50212 Malignant neoplasm of upper-inner quadrant of left female breast: Secondary | ICD-10-CM | POA: Diagnosis not present

## 2023-05-15 DIAGNOSIS — Z86718 Personal history of other venous thrombosis and embolism: Secondary | ICD-10-CM | POA: Diagnosis not present

## 2023-05-15 DIAGNOSIS — Z8544 Personal history of malignant neoplasm of other female genital organs: Secondary | ICD-10-CM | POA: Insufficient documentation

## 2023-05-15 DIAGNOSIS — Z9012 Acquired absence of left breast and nipple: Secondary | ICD-10-CM | POA: Diagnosis not present

## 2023-05-15 NOTE — Therapy (Signed)
OUTPATIENT PHYSICAL THERAPY RE-ASSESSMENT/RECERTIFICATION   Patient Name: Sandra Miller MRN: [id number] DOB:01-Dec-1975, 47 y.o., female Today's Date: 05/15/2023  END OF SESSION:  PT End of Session - 05/15/23 1532     Visit Number 3    Number of Visits 13    Date for PT Re-Evaluation 06/26/23    Authorization Type [employer]    PT Start Time 1532    PT Stop Time 1610    PT Time Calculation (min) 38 min    Activity Tolerance Patient tolerated treatment well;Patient limited by fatigue    Behavior During Therapy WFL for tasks assessed/performed               Past Medical History:  Diagnosis Date   Arthritis    Bipolar 1 disorder (HCC)    Cancer (HCC)    vulva, and breast   Complication of anesthesia    wakes up during procedures   Depression    GAD (generalized anxiety disorder)    Genital HSV    currently per pt  no break out 03-22-2016    GERD (gastroesophageal reflux disease)    Graves disease    Hiatal hernia    History of cervical dysplasia    2012 laser ablation   History of esophageal dilatation    for dysphasia -- x2 dilated   History of gastric ulcer    History of Helicobacter pylori infection    remote hx   History of hidradenitis suppurativa    "gets all over body intermittantly"     History of hypertension    no issue since stopped drinking alcohol 2014   History of kidney stones    History of panic attacks    History of radiation therapy 03/16/2020-05/08/2020   vulva  Dr Antony Blackbird   History of radiation therapy 09/09/2020-10/23/2020   left chest wall/left SCV   Dr Antony Blackbird   Hypertension    Iron deficiency anemia    Left ureteral stone    OCD (obsessive compulsive disorder)    PONV (postoperative nausea and vomiting)    Pre-diabetes    PTSD (post-traumatic stress disorder)    Recovering alcoholic in remission (HCC)    since 2014   RLS (restless legs syndrome)    Smokers' cough (HCC)    Thyroid disease    Urgency of urination     Yeast infection involving the vagina and surrounding area    secondary to taking antibiotic   Past Surgical History:  Procedure Laterality Date   CESAREAN SECTION  1995   w/  Bilateral Tubal Ligation   COLONOSCOPY  last one 08-09-2013   CYSTOSCOPY W/ URETERAL STENT PLACEMENT Left 03/29/2016   Procedure: CYSTOSCOPY WITH STENT REPLACEMENT;  Surgeon: Hildred Laser, MD;  Location: [facility];  Service: Urology;  Laterality: Left;   CYSTOSCOPY WITH RETROGRADE PYELOGRAM, URETEROSCOPY AND STENT PLACEMENT Left 03/08/2016   Procedure: CYSTOSCOPY WITH  LEFT RETROGRADE PYELOGRAM, AND STENT PLACEMENT;  Surgeon: Hildred Laser, MD;  Location: [facility];  Service: Urology;  Laterality: Left;   CYSTOSCOPY/RETROGRADE/URETEROSCOPY/STONE EXTRACTION WITH BASKET Left 03/29/2016   Procedure: CYSTOSCOPY/RETROGRADE/URETEROSCOPY/STONE EXTRACTION WITH BASKET;  Surgeon: Hildred Laser, MD;  Location: [facility];  Service: Urology;  Laterality: Left;   ENDOMETRIAL ABLATION W/ NOVASURE  04-01-2010   ESOPHAGOGASTRODUODENOSCOPY  last one 08-09-2013   KNEE ARTHROSCOPY Left as teen   LASER ABLATION OF THE CERVIX  2012 approx   MASTECTOMY WITH AXILLARY LYMPH NODE DISSECTION Left 07/27/2020  Procedure: LEFT MASTECTOMY WITH LEFT RADIOACTIVE SEED GUIDED TARGETED AXILLARY LYMPH NODE DISSECTION;  Surgeon: Ovidio Kin, MD;  Location: [facility];  Service: General;  Laterality: Left;   PORT-A-CATH REMOVAL Right 05/18/2021   Procedure: REMOVAL PORT-A-CATH;  Surgeon: Manus Rudd, MD;  Location: [facility];  Service: General;  Laterality: Right;   PORTACATH PLACEMENT Right 03/06/2020   Procedure: INSERTION PORT-A-CATH WITH ULTRASOUND GUIDANCE;  Surgeon: Ovidio Kin, MD;  Location: [facility];  Service: General;  Laterality: Right;   ROBOTIC ASSISTED TOTAL HYSTERECTOMY WITH BILATERAL SALPINGO OOPHERECTOMY N/A 05/18/2021   Procedure: XI ROBOTIC ASSISTED TOTAL HYSTERECTOMY WITH BILATERAL  SALPINGO OOPHORECTOMY;  Surgeon: Adolphus Birchwood, MD;  Location: [facility];  Service: Gynecology;  Laterality: N/A;   TRANSTHORACIC ECHOCARDIOGRAM  05-19-2006   lvsf normal, ef 55-65%, there was mild flattening of the interventricular septum during diastoli/  RV size at upper limits normal   TUBAL LIGATION     VULVA /PERINEUM BIOPSY N/A 05/18/2021   Procedure: VULVAR BIOPSY;  Surgeon: Adolphus Birchwood, MD;  Location: [facility];  Service: Gynecology;  Laterality: N/A;   WISDOM TOOTH EXTRACTION  age 26 's   Patient Active Problem List   Diagnosis Date Noted   Fatigue 05/15/2023   Prolonged QT interval 04/10/2023   Dizziness 04/10/2023   Provoked seizures (HCC) 03/31/2023   Respiratory depression 03/31/2023   Status epilepticus (HCC) 03/31/2023   Migraine with aura and without status migrainosus, not intractable 10/03/2022   Pain in right knee 09/07/2022   Bilateral primary osteoarthritis of knee 09/07/2022   Chronic migraine without aura without status migrainosus, not intractable 07/07/2022   Graves disease 05/10/2022   Sebaceous cyst 12/06/2021   Pain in left knee 11/03/2021   Hyperthyroidism 08/13/2021   Bipolar 1 disorder (HCC) 05/07/2021   Obesity (BMI 30.0-34.9) 05/07/2021   Hyperlipidemia 08/12/2020   Prediabetes 08/12/2020   Hypomagnesemia 08/12/2020   Mutation in BRIP1 gene 06/24/2020   Hypokalemia 06/17/2020   Dehydration    Colitis 06/16/2020   Port-A-Cath in place 03/11/2020   Malignant neoplasm of upper-inner quadrant of left breast in female, estrogen receptor positive (HCC) 02/25/2020   Vulvar cancer (HCC) 01/29/2020   Vulvar ulceration 01/23/2020   Essential hypertension 10/31/2019   Nausea and vomiting 12/27/2018   Seasonal allergic rhinitis 12/27/2018   History of ELISA positive for HSV 10/01/2018   Restless leg syndrome 04/01/2016   Nephrolithiasis 03/08/2016   Obstructive pyelonephritis 03/08/2016   Normocytic anemia 03/08/2016   Hidradenitis suppurativa of left axilla  11/03/2015   Loss of weight 09/08/2015   Neck pain on left side 10/22/2014   Genital warts 05/20/2014   GERD (gastroesophageal reflux disease) 12/26/2012   Functional dyspepsia 12/26/2012   Tobacco abuse 08/22/2012   Herpes simplex virus (HSV) infection 02/03/2007    PCP: Marcine Matar, MD  REFERRING PROVIDER: Marcine Matar, MD 05/11/23 shoulder referral - Madelyn Brunner, DO  REFERRING DIAG: Weakness [R53.1], Gait disturbance [R26.9]  05/11/23 referral: M75.42 (ICD-10-CM) - Impingement syndrome of left shoulder M25.512,G89.29 (ICD-10-CM) - Chronic left shoulder pain M89.8X1,G89.29 (ICD-10-CM) - Chronic scapular pain  THERAPY DIAG:  Muscle weakness (generalized)  Other abnormalities of gait and mobility  Unsteadiness on feet  Rationale for Evaluation and Treatment: Rehabilitation  ONSET DATE: 03/31/23  SUBJECTIVE:   SUBJECTIVE STATEMENT:  Pt endorses continued L shoulder pain, feels a bit worse after injection on the 5th. About 8/10 pain at present. Notes most difficulty with unsupported sitting/standing or reaching activities. She notes shoulder pain was exacerbated  by episode with her seizures and seems to be fairly consistent since.   PERTINENT HISTORY: NO BP OR IV IN LEFT UE d/t Lymphedema   PMHx includes Arthritis, Bipolar 1, Vulva and Breast Cancer (currently in remission), Depression, GAD, Graves Disease, Anemia, OCD, pre-diabetes, PTSD, restless leg syndrome, seizure, chronic migraine    PAIN:  Are you having pain? Yes, L shoulder pain Pain best/worst: 8-10/10 Aggravating factors: sitting/standing unsupported Easing factors: having arm supported, medication patches  PRECAUTIONS: Other: Lymphedema Alert - NO BP on LEFT ARM    WEIGHT BEARING RESTRICTIONS: No  FALLS:  Has patient fallen in last 6 months?  "Not that I recall"  LIVING ENVIRONMENT: Lives with: lives with their family Lives in: House/apartment Stairs: No Has following equipment at home:  Walker - 4 wheeled, bed side commode, and Grab bars  OCCUPATION: CNA  PLOF: Independent  PATIENT GOALS: "I want to be able to go back to work."   NEXT MD VISIT: 04/26/23 Neurologist, 05/02/23 Orthopedic   OBJECTIVE: (objective measures completed at initial evaluation unless otherwise dated)   DIAGNOSTIC FINDINGS:   04/05/23: HIP UNILAT W or WO Pelvis 2-3 views IMPRESSION: Mild-to-moderate right and mild left femoroacetabular osteoarthritis.  PATIENT SURVEYS:  FOTO 39 current, 56 predicted   COGNITION: Overall cognitive status: Impaired - related to recent changes with memory as noted in subjective assessment      SENSATION: Not tested  POSTURE: rounded shoulders and posterior pelvic tilt   UPPER EXTREMITY ROM:  A/PROM Right 05/15/23 Left 05/15/23  Shoulder flexion 158 deg 122 deg *  Shoulder abduction    Shoulder internal rotation    Shoulder external rotation (functional) C6 C5  Elbow flexion WNL WNL  Elbow extension WNL WNL  Wrist flexion    Wrist extension     (Blank rows = not tested) (Key: WFL = within functional limits not formally assessed, * = concordant pain, s = stiffness/stretching sensation, NT = not tested)  Comments:    LOWER EXTREMITY MMT:  MMT Right eval Left eval  Hip flexion 3+ 3+  Hip extension 4- 4-  Hip abduction 4- 4-  Hip adduction    Hip internal rotation 3+ 4-  Hip external rotation 3+ 4-  Knee flexion 3+ 4-  Knee extension 3+ 3+  Ankle dorsiflexion 4+ 4+  Ankle plantarflexion    Ankle inversion    Ankle eversion     (Blank rows = not tested)  UPPER EXTREMITY MMT:  MMT Right 05/15/23 Left 05/15/23  Shoulder flexion 4 4 s  Shoulder extension    Shoulder abduction 4+ 4+  Shoulder extension    Shoulder internal rotation 4 4-  Shoulder external rotation 4 4  Elbow flexion    Elbow extension    Grip strength    (Blank rows = not tested)  (Key: WFL = within functional limits not formally assessed, * = concordant pain, s =  stiffness/stretching sensation, NT = not tested)  Comments:    FUNCTIONAL TESTS:  Timed up and go (TUG): TBD  Balance    Romberg EO on floor  30 sec, Min sway  Romberg EC on floor 30 sec, min sway  Romberg EO on airex 30 sec, mod sway  Romberg EC on airex  30 sec, mod sway     Balance Right Left   Tandem on floor 30 sec 30 sec  SLS on floor  11 sec 7 sec    GAIT: Distance walked: 50 feet Assistive device utilized: None Level of  assistance: SBA Comments: slow gait speed - BIL small step length with decreased stance time    TODAY'S TREATMENT:               [facility] Adult PT Treatment:                                                DATE: 05/15/23 Therapeutic Exercise: Standing marches x8 BIL with counter support  Standing heel raises x10 with counter support Hip 45 deg kickback x8 BIL cues for alignment Shoulder rolls fwd/back x10 each cues for pacing and ROM Shoulder retraction x8 cues for posture HEP update to include shoulder, education on appropriate performance  Therapeutic Activity: MSK assessment + education as relates to new referral for shoulder - education/discussion re: PT POC and goals    [facility] Adult PT Treatment:                                                DATE: 05/03/23 Therapeutic Exercise: Nustep level 4 x 5 mins (100 steps) Seated marching 2x30" Seated heel/toe raises 2# 2x10 Standing heel raises UE support 2x10 Standing hip abduction 2x10 BIL LAQ 2# 2x10 BIL Seated hamstring curl GTB 2x10 BIL Bridges 2x10 SLR x10 BIL Supine hip adduction ball squeeze 5" hold 2x10 Sidelying clamshell 2x10 BIL                                                                                                                  [facility] Adult PT Treatment:                                                DATE: 04/25/2023   Initial evaluation: see patient education and home exercise program as noted below     PATIENT EDUCATION:  Education details: rationale for interventions,  PT exam findings and POC  Person educated: Patient Education method: Medical illustrator Education comprehension: verbalized understanding, returned demonstration, and needs further education  HOME EXERCISE PROGRAM: Access Code: [email or web address] URL: [email or web address] Date: 05/15/2023 Prepared by: Fransisco Hertz  Exercises - Sit to Stand with Armchair  - 2-3 x daily - 7 x weekly - 1 sets - 5 reps - Heel Raises with Counter Support  - 2-3 x daily - 7 x weekly - 1 sets - 8 reps - Seated Scapular Retraction  - 2-3 x daily - 7 x weekly - 1 sets - 8 reps  ASSESSMENT:  CLINICAL IMPRESSION: 05/15/2023 Pt arrives w/ 8/10 L shoulder pain she states has been more irritated since injection last week. Otherwise pt reports continuing to feel limited primarily by fatigue. On  shoulder exam, pt with concordant tenderness mostly in periscapular musculature, with limitations in GH ROM on LUE and mildly reduced RC strength compared to contralateral limb. Tolerates remainder of session relatively well, limited mostly by fatigue although pt has mild irritability of L shoulder blade with shoulder rolls; better tolerance is noted with retraction which is added to HEP. No adverse events, pt denies any significant change in symptoms on departure. Update for POC below to include new goals with L shoulder. Recommend continuing along current POC in order to address relevant deficits and improve functional tolerance. Pt departs today's session in no acute distress, all voiced questions/concerns addressed appropriately from PT perspective.      OBJECTIVE IMPAIRMENTS: Abnormal gait, decreased activity tolerance, decreased balance, decreased endurance, decreased mobility, difficulty walking, decreased strength, impaired perceived functional ability, improper body mechanics, postural dysfunction, and pain.   ACTIVITY LIMITATIONS: carrying, lifting, standing, squatting, stairs, transfers, bed mobility,  locomotion level, and caring for others  PARTICIPATION LIMITATIONS: cleaning, community activity, and occupation  PERSONAL FACTORS: Past/current experiences, Time since onset of injury/illness/exacerbation, and 3+ comorbidities: PMHx includes Arthritis, Bipolar 1, Vulva and Breast Cancer (currently in remission), Depression, GAD, Graves Disease, Anemia, OCD, pre-diabetes, PTSD, restless leg syndrome, seizure, chronic migraine   are also affecting patient's functional outcome.   REHAB POTENTIAL: Fair    CLINICAL DECISION MAKING: Evolving/moderate complexity  EVALUATION COMPLEXITY: Moderate   GOALS: Goals reviewed with patient? Yes  SHORT TERM GOALS: Target date: 05/16/2023   Patient will be independent with initial home program for LE strengthening and balance . Baseline: to be initiated at follow up visit  05/15/23: HEP established Goal status: ONGOING  2.  Patient will ambulate 6 minutes with LRAD without LOB.  Baseline: requires RW, unsteady with walking >50-177ft 05/15/23: ambulates without device within clinic, no instability or LOB noted Goal status: ONGOING   LONG TERM GOALS: Target date: 06/26/2023 (updated 05/15/23 given updated referral to include shoulder) Patient will report improved overall functional ability with FOTO score of 56 predicted.  Baseline: 39 current Goal status: INITIAL  2.  Patient will perform SLS at least 20 sec on each LE in order to maximize safe and independent function.  Baseline:  Balance Right Left   Tandem on floor 30 sec 30 sec  SLS on floor  11 sec 7 sec   Goal status: INITIAL  3.  Patient will demonstrate at least 4 or 4+/5 MMT with all LE strength testing.   Baseline:  MMT Right eval Left eval  Hip flexion 3+ 3+  Hip extension 4- 4-  Hip abduction 4- 4-  Hip adduction    Hip internal rotation 3+ 4-  Hip external rotation 3+ 4-  Knee flexion 3+ 4-  Knee extension 3+ 3+  Ankle dorsiflexion 4+ 4+   Goal status: INITIAL  4.   Patient will demonstrate ability to walk 6+ minutes at normal gait speed without AD or LOB.  Baseline: requires RW  Goal status: INITIAL  5.  Patient will demonstrate ability to ambulate over varied surfaces, including inclines without LOB and no more than minimal fatigue after 15 minutes of weight bearing activities.  Baseline: unable  Goal status: INITIAL  6. Pt will demonstrate at least 145 deg L shoulder elevation AROM in order to facilitate improved tolerance to daily tasks.  Baseline: see ROM chart above  Goal status: NEW 05/15/23  7. Pt will report at least 50% decrease in overall pain levels in past week in order to facilitate improved  tolerance to basic ADLs/mobility.   Baseline: 7-10/10  Goal status: NEW 05/15/23     PLAN: updated 05/15/23 to include shoulder POC  PT FREQUENCY: 1-2x/week    PT DURATION: 6 weeks  PLANNED INTERVENTIONS: Therapeutic exercises, Therapeutic activity, Neuromuscular re-education, Balance training, Gait training, Patient/Family education, Self Care, Joint mobilization, Electrical stimulation, Cryotherapy, Moist heat, Manual therapy, and Re-evaluation  PLAN FOR NEXT SESSION: balance (narrow BOS, SLS, uneven surfaces), gait activities encouraging increased SLS time, reactive balance, BIL LE strengthening. Postural mobility and GH mobility within pt tolerance, would likely benefit from gradual IR strengthening and scapular work    Ashley Murrain PT, DPT 05/15/2023 5:40 PM

## 2023-05-15 NOTE — Assessment & Plan Note (Signed)
Breast cancer and vulvar cancers   02/20/2020:PET scan on 02/07/20 following a diagnosis of vulvar cancer showed a 1.7cm left breast mass and mildly hypermetabolic left axillary lymph nodes. Mammogram and [place] on 02/18/20 showed a 3.1cm left breast mass at the 11 o'clock position, palpable on exam, and a single abnormal left axillary lymph node with cortical thickening. Left breast biopsy on 02/20/20 showed invasive and in situ carcinoma in the breast and axilla, grade 3, HER-2 positive (3+), ER/PR+ 95%, Ki67 75%   Treatment plan: 1.  Neoadjuvant chemoradiation for vulvar cancer with Taxol, carboplatin, Herceptin weekly x6 cycles 2. neoadjuvant chemotherapy with TCH Perjeta x3 cycles 3. 07/27/20: Left mastectomy Ezzard Standing): three foci of IDC, 3.7cm, 1.5cm, and 1.2cm, with intermediate to high grade DCIS, clear margins, with 1/2 left axillary lymph nodes positive for carcinoma. 4. Followed by adjuvant radiation therapy to be completed on 10/23/20 5.  Kadcyla maintenance completed 04/06/2021 6.  Adjuvant antiestrogen therapy with anastrozole started 12/01/2020 switched to letrozole 02/23/2021 ------------------------------------------------------------------------------------------------------------------- Current treatment: Letrozole   Letrozole toxicities: 1.  Severe hot flashes   2.  Depression: Sees psychiatry.   Hospitalization 03/31/2023-04/05/2023 seizures with syncope: MRI brain: No acute intracranial process CSF negative, treated with Keppra, mild rhabdomyolysis severe hyperthyroidism/Graves' disease.    She is struggling to get this under control   Left jugular DVT:  anticoagulation October 2021 until March 2023: Ultrasound 11/04/2021: Negative for DVT.      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  She is happy about it.

## 2023-05-15 NOTE — Progress Notes (Signed)
Patient Care Team: Marcine Matar, MD as PCP - General (Internal Medicine) Serena Croissant, MD as Consulting Physician (Hematology and Oncology) Ovidio Kin, MD as Consulting Physician (General Surgery) Paulina Fusi, Servando Snare, RN as Oncology Nurse Navigator (Oncology) Adolphus Birchwood, MD as Consulting Physician (Gynecologic Oncology) Doylene Bode, NP as Nurse Practitioner (Gynecologic Oncology) Antony Blackbird, MD as Consulting Physician (Radiation Oncology) Maryclare Labrador, RN as Registered Nurse Antionette Char, MD as Consulting Physician (Obstetrics and Gynecology)  DIAGNOSIS:  Encounter Diagnoses  Name Primary?   Malignant neoplasm of upper-inner quadrant of left breast in female, estrogen receptor positive (HCC) Yes   Fatigue due to excessive exertion, initial encounter     SUMMARY OF ONCOLOGIC HISTORY: Oncology History  Vulvar cancer (HCC)  01/29/2020 Initial Diagnosis   Vulvar cancer (HCC)   03/11/2020 - 05/04/2020 Chemotherapy   The patient had dexamethasone (DECADRON) 4 MG tablet, 4 mg (100 % of original dose 4 mg), Oral, Daily, 1 of 1 cycle, Start date: 02/27/2020, End date: 05/05/2020 Dose modification: 4 mg (original dose 4 mg, Cycle 0) palonosetron (ALOXI) injection 0.25 mg, 0.25 mg, Intravenous,  Once, 1 of 1 cycle Administration: 0.25 mg (03/11/2020), 0.25 mg (03/17/2020), 0.25 mg (03/24/2020), 0.25 mg (03/31/2020), 0.25 mg (04/07/2020), 0.25 mg (04/14/2020), 0.25 mg (04/21/2020) CARBOplatin (PARAPLATIN) 300 mg in sodium chloride 0.9 % 250 mL chemo infusion, 300 mg (100 % of original dose 300 mg), Intravenous,  Once, 1 of 1 cycle Dose modification:   (original dose 300 mg, Cycle 1) Administration: 300 mg (03/11/2020), 290 mg (03/17/2020), 300 mg (03/24/2020), 230 mg (03/31/2020), 230 mg (04/07/2020), 230 mg (04/14/2020) PACLitaxel (TAXOL) 108 mg in sodium chloride 0.9 % 250 mL chemo infusion (</= 80mg /m2), 50 mg/m2 = 108 mg, Intravenous,  Once, 1 of 1 cycle Dose modification: 45 mg/m2  (original dose 50 mg/m2, Cycle 1, Reason: Dose not tolerated) Administration: 108 mg (03/11/2020), 108 mg (03/17/2020), 108 mg (03/24/2020), 96 mg (03/31/2020), 96 mg (04/07/2020), 96 mg (04/14/2020) fosaprepitant (EMEND) 150 mg in sodium chloride 0.9 % 145 mL IVPB, 150 mg, Intravenous,  Once, 1 of 1 cycle Administration: 150 mg (03/24/2020), 150 mg (03/31/2020), 150 mg (04/07/2020), 150 mg (04/14/2020) trastuzumab-anns (KANJINTI) 378 mg in sodium chloride 0.9 % 250 mL chemo infusion, 4 mg/kg = 378 mg (100 % of original dose 4 mg/kg), Intravenous,  Once, 1 of 1 cycle Dose modification: 4 mg/kg (original dose 4 mg/kg, Cycle 1, Reason: Other (see comments), Comment: loading dose) Administration: 378 mg (03/11/2020), 189 mg (03/17/2020), 189 mg (03/24/2020), 189 mg (03/31/2020), 189 mg (04/07/2020), 189 mg (04/14/2020), 189 mg (04/21/2020), 189 mg (05/04/2020)  for chemotherapy treatment.    03/16/2020 - 05/08/2020 Radiation Therapy   Patient received 45 Gy in 25 treatments to pelvis Boost received 16.2 Gy in 9 treatments to vulva    Genetic Testing   Patient has genetic testing done for 06/22/2020. Results revealed patient has the following mutation(s): BRIP 1 mutation   Malignant neoplasm of upper-inner quadrant of left breast in female, estrogen receptor positive (HCC)  02/20/2020 Initial Diagnosis   PET scan on 02/07/20 following a diagnosis of vulvar cancer showed a 1.7cm left breast mass and mildly hypermetabolic left axillary lymph nodes. Mammogram and [place] on 02/18/20 showed a 3.1cm left breast mass at the 11 o'clock position, palpable on exam, and a single abnormal left axillary lymph node with cortical thickening. Left breast biopsy on 02/20/20 showed invasive and in situ carcinoma in the breast and axilla, grade 3, HER-2 positive (3+),  ER/PR+ 95%, Ki67 75%   02/25/2020 Cancer Staging   Staging form: Breast, AJCC 8th Edition - Clinical stage from 02/25/2020: Stage IB (cT2, cN1, cM0, G3, ER+, PR+, HER2+) - Signed by  Serena Croissant, MD on 02/25/2020   05/29/2020 - 05/29/2020 Chemotherapy   The patient had palonosetron (ALOXI) injection 0.25 mg, 0.25 mg, Intravenous,  Once, 1 of 3 cycles Administration: 0.25 mg (05/29/2020) pegfilgrastim-jmdb (FULPHILA) injection 6 mg, 6 mg, Subcutaneous,  Once, 1 of 3 cycles CARBOplatin (PARAPLATIN) 580 mg in sodium chloride 0.9 % 250 mL chemo infusion, 580 mg (100 % of original dose 580 mg), Intravenous,  Once, 1 of 3 cycles Dose modification:   (original dose 580 mg, Cycle 1) Administration: 580 mg (05/29/2020) DOCEtaxel (TAXOTERE) 120 mg in sodium chloride 0.9 % 250 mL chemo infusion, 60 mg/m2 = 120 mg (100 % of original dose 60 mg/m2), Intravenous,  Once, 1 of 3 cycles Dose modification: 60 mg/m2 (original dose 60 mg/m2, Cycle 1, Reason: Provider Judgment), 50 mg/m2 (original dose 60 mg/m2, Cycle 2, Reason: Dose not tolerated) Administration: 120 mg (05/29/2020) fosaprepitant (EMEND) 150 mg in sodium chloride 0.9 % 145 mL IVPB, 150 mg, Intravenous,  Once, 1 of 3 cycles Administration: 150 mg (05/29/2020) pertuzumab (PERJETA) 420 mg in sodium chloride 0.9 % 250 mL chemo infusion, 420 mg (50 % of original dose 840 mg), Intravenous, Once, 1 of 3 cycles Dose modification: 420 mg (original dose 840 mg, Cycle 1, Reason: Provider Judgment) Administration: 420 mg (05/29/2020) trastuzumab-dkst (OGIVRI) 525 mg in sodium chloride 0.9 % 250 mL chemo infusion, 6 mg/kg = 525 mg (75 % of original dose 8 mg/kg), Intravenous,  Once, 1 of 3 cycles Dose modification: 6 mg/kg (original dose 8 mg/kg, Cycle 1, Reason: Provider Judgment) Administration: 525 mg (05/29/2020)  for chemotherapy treatment.    06/22/2020 Genetic Testing   Positive genetic testing: Heterozygous pathogenic variant detected in BRIP1 gene at c.2010dup (p.Glu671*).  Variant of uncertain significance detected in POLD1 at c.532G>A (p.Gly178Arg).  No other pathogenic variants detected in [facility] Common Hereditary Cancers Panel.   The report date is June 22, 2020.   The Common Hereditary Cancers Panel offered by [facility] includes sequencing and/or deletion duplication testing of the following 48 genes: APC, ATM, AXIN2, BARD1, BMPR1A, BRCA1, BRCA2, BRIP1, CDH1, CDK4, CDKN2A (p14ARF), CDKN2A (p16INK4a), CHEK2, CTNNA1, DICER1, EPCAM (Deletion/duplication testing only), GREM1 (promoter region deletion/duplication testing only), KIT, MEN1, MLH1, MSH2, MSH3, MSH6, MUTYH, NBN, NF1, NHTL1, PALB2, PDGFRA, PMS2, POLD1, POLE, PTEN, RAD50, RAD51C, RAD51D, RNF43, SDHB, SDHC, SDHD, SMAD4, SMARCA4. STK11, TP53, TSC1, TSC2, and VHL.  The following genes were evaluated for sequence changes only: SDHA and HOXB13 c.251G>A variant only.   07/27/2020 Surgery   Left mastectomy [facility] Standing): three foci of IDC, 3.7cm, 1.5cm, and 1.2cm, with intermediate to high grade DCIS, clear margins, with 1/2 left axillary lymph nodes positive for carcinoma.    08/18/2020 - 04/06/2021 Chemotherapy   Patient is on Treatment Plan : BREAST ADO-Trastuzumab Emtansine (Kadcyla) q21d     09/09/2020 - 10/23/2020 Radiation Therapy   Adjuvant radiation     CHIEF COMPLIANT: Follow-up of left breast cancer and vulvar cancer on letrozole/ Hospital fu  INTERVAL HISTORY: Sandra Miller is a 47 y.o. with above-mentioned history of left breast cancer and vulvar cancer. She presents to the clinic for a follow-up. Patient reports that she has started physical therapist to help with her walking. She states that she feels very weak. She hurt her shoulder when she had the seizure.  She has not had a mammogram this year.  She gets this through [facility].   ALLERGIES:  is allergic to dilaudid [hydromorphone hcl], aspirin, depakote [divalproex sodium], and minocycline.  MEDICATIONS:  Current Outpatient Medications  Medication Sig Dispense Refill   diclofenac Sodium (VOLTAREN) 1 % GEL Apply 4 grams topically 4  times daily as needed. (Patient taking differently: Apply 4 g topically 4  (four) times daily as needed (pain).) 500 g 6   FLUoxetine (PROZAC) 10 MG capsule Take 3 capsules (30 mg total) by mouth daily. 90 capsule 1   gabapentin (NEURONTIN) 300 MG capsule Take 1 capsule (300 mg) by mouth daily. 30 capsule 1   lamoTRIgine (LAMICTAL) 25 MG tablet Take 3 tablets (75 mg total) by mouth daily for two weeks and then take 4 tablets by mouth daily. 120 tablet 1   letrozole (FEMARA) 2.5 MG tablet Take 1 tablet by mouth daily. 90 tablet 3   levETIRAcetam (KEPPRA) 750 MG tablet Take 1 tablet (750 mg total) by mouth 2 (two) times daily. 60 tablet 0   levETIRAcetam (KEPPRA) 750 MG tablet Take 1 tablet (750 mg total) by mouth 2 (two) times daily. 180 tablet 3   lisinopril (ZESTRIL) 10 MG tablet Take 1 tablet (10 mg total) by mouth daily. Stop the Lisinopril/HCTZ combination 90 tablet 1   loratadine (CLARITIN REDITABS) 10 MG dissolvable tablet Take 10 mg by mouth daily. OTC     LORazepam (ATIVAN) 1 MG tablet Take 1 tablet (1 mg total) by mouth 3 (three) times daily as needed for anxiety 90 tablet 1   Magnesium 400 MG TABS Take 800 mg by mouth 2 (two) times daily. 120 tablet 1   methimazole (TAPAZOLE) 5 MG tablet Take one tablet (5 mg dose) by mouth daily. (Patient taking differently: Take 5 mg by mouth daily.) 90 tablet 3   Multiple Vitamin (MULTIVITAMIN WITH MINERALS) TABS tablet Take 1 tablet by mouth daily.     Multiple Vitamins-Minerals (CERTA-VITE PO) Take by mouth daily. OTC     omeprazole (PRILOSEC) 40 MG capsule Take 1 capsule (40 mg) by mouth in the morning and at bedtime. 60 capsule 1   potassium chloride SA (KLOR-CON M) 20 MEQ tablet Take 1 tablet (20 mEq) by mouth daily. 90 tablet 1   propranolol (INDERAL) 10 MG tablet Take 1 tablet by mouth 2 times daily. 180 tablet 0   spironolactone (ALDACTONE) 25 MG tablet Take 0.5 tablets (12.5 mg total) by mouth daily. 60 tablet 1   senna-docusate (SENOKOT-S) 8.6-50 MG tablet Take 2 tablets by mouth at bedtime. For AFTER surgery only,  do not take if having diarrhea (Patient not taking: Reported on 05/15/2023) 30 tablet 0   No current facility-administered medications for this visit.    PHYSICAL EXAMINATION: ECOG PERFORMANCE STATUS: 1 - Symptomatic but completely ambulatory  Vitals:   05/15/23 0759  BP: 126/84  Pulse: 70  Resp: 18  Temp: (!) 97.4 F (36.3 C)  SpO2: 100%   Filed Weights   05/15/23 0759  Weight: 178 lb 6.4 oz (80.9 kg)      LABORATORY DATA:  I have reviewed the data as listed    Latest Ref Rng & Units 04/21/2023    2:00 PM 04/14/2023   12:07 PM 04/12/2023   12:46 AM  CMP  Glucose 70 - 99 mg/dL  90  161   BUN 6 - 24 mg/dL  7  6   Creatinine 0.96 - 1.00 mg/dL  0.45  4.09  Sodium 134 - 144 mmol/L  140  136   Potassium 3.5 - 5.2 mmol/L  4.4  3.4   Chloride 96 - 106 mmol/L  100  101   CO2 20 - 29 mmol/L  19  22   Calcium 8.7 - 10.2 mg/dL 96.0  45.4  9.2   Total Protein 6.5 - 8.1 g/dL   6.8   Total Bilirubin 0.3 - 1.2 mg/dL   0.9   Alkaline Phos 38 - 126 U/L   45   AST 15 - 41 U/L   28   ALT 0 - 44 U/L   39     Lab Results  Component Value Date   WBC 9.8 04/12/2023   HGB 12.5 04/12/2023   HCT 38.0 04/12/2023   MCV 91.8 04/12/2023   PLT 421 (H) 04/12/2023   NEUTROABS 6.0 04/12/2023    ASSESSMENT & PLAN:  Malignant neoplasm of upper-inner quadrant of left breast in female, estrogen receptor positive (HCC) Breast cancer and vulvar cancers   02/20/2020:PET scan on 02/07/20 following a diagnosis of vulvar cancer showed a 1.7cm left breast mass and mildly hypermetabolic left axillary lymph nodes. Mammogram and [place] on 02/18/20 showed a 3.1cm left breast mass at the 11 o'clock position, palpable on exam, and a single abnormal left axillary lymph node with cortical thickening. Left breast biopsy on 02/20/20 showed invasive and in situ carcinoma in the breast and axilla, grade 3, HER-2 positive (3+), ER/PR+ 95%, Ki67 75%   Treatment plan: 1.  Neoadjuvant chemoradiation for vulvar cancer with Taxol,  carboplatin, Herceptin weekly x6 cycles 2. neoadjuvant chemotherapy with TCH Perjeta x3 cycles 3. 07/27/20: Left mastectomy [facility] Standing): three foci of IDC, 3.7cm, 1.5cm, and 1.2cm, with intermediate to high grade DCIS, clear margins, with 1/2 left axillary lymph nodes positive for carcinoma. 4. Followed by adjuvant radiation therapy to be completed on 10/23/20 5.  Kadcyla maintenance completed 04/06/2021 6.  Adjuvant antiestrogen therapy with anastrozole started 12/01/2020 switched to letrozole 02/23/2021 ------------------------------------------------------------------------------------------------------------------- Current treatment: Letrozole   Letrozole toxicities: 1.  Severe hot flashes   2.  Depression: Sees psychiatry.   Hospitalization 03/31/2023-04/05/2023 seizures with syncope: MRI brain: No acute intracranial process CSF negative, treated with Keppra, mild rhabdomyolysis severe hyperthyroidism/Graves' disease.    She is struggling to get this under control   Left jugular DVT:  anticoagulation October 2021 until March 2023: Ultrasound 11/04/2021: Negative for DVT.    Severe depression Based on all of these issues she is contemplating on applying for disability which is reasonable.  Severe fatigue: I will start her on B12 injections weekly x 4 then monthly. Return to clinic in 1 year for follow-up   No orders of the defined types were placed in this encounter.  The patient has a good understanding of the overall plan. she agrees with it. she will call with any problems that may develop before the next visit here. Total time spent: 30 mins including face to face time and time spent for planning, charting and co-ordination of care   Tamsen Meek, MD 05/15/23    I Janan Ridge am acting as a Neurosurgeon for [employer]  I have reviewed the above documentation for accuracy and completeness, and I agree with the above.

## 2023-05-17 ENCOUNTER — Ambulatory Visit: Payer: PRIVATE HEALTH INSURANCE

## 2023-05-17 ENCOUNTER — Telehealth (INDEPENDENT_AMBULATORY_CARE_PROVIDER_SITE_OTHER): Payer: Self-pay | Admitting: Internal Medicine

## 2023-05-17 DIAGNOSIS — M25512 Pain in left shoulder: Secondary | ICD-10-CM

## 2023-05-17 DIAGNOSIS — M6281 Muscle weakness (generalized): Secondary | ICD-10-CM | POA: Diagnosis not present

## 2023-05-17 DIAGNOSIS — R2681 Unsteadiness on feet: Secondary | ICD-10-CM

## 2023-05-17 DIAGNOSIS — R2689 Other abnormalities of gait and mobility: Secondary | ICD-10-CM

## 2023-05-17 NOTE — Therapy (Signed)
OUTPATIENT PHYSICAL THERAPY TREATMENT NOTE  Patient Name: Sandra Miller MRN: [id number] DOB:Feb 16, 1976, 47 y.o., female Today's Date: 05/17/2023  END OF SESSION:  PT End of Session - 05/17/23 1527     Visit Number 4    Number of Visits 13    Date for PT Re-Evaluation 06/26/23    Authorization Type [employer]    PT Start Time 1530    PT Stop Time 1609    PT Time Calculation (min) 39 min    Activity Tolerance Patient tolerated treatment well;Patient limited by fatigue    Behavior During Therapy WFL for tasks assessed/performed             Past Medical History:  Diagnosis Date   Arthritis    Bipolar 1 disorder (HCC)    Cancer (HCC)    vulva, and breast   Complication of anesthesia    wakes up during procedures   Depression    GAD (generalized anxiety disorder)    Genital HSV    currently per pt  no break out 03-22-2016    GERD (gastroesophageal reflux disease)    Graves disease    Hiatal hernia    History of cervical dysplasia    2012 laser ablation   History of esophageal dilatation    for dysphasia -- x2 dilated   History of gastric ulcer    History of Helicobacter pylori infection    remote hx   History of hidradenitis suppurativa    "gets all over body intermittantly"     History of hypertension    no issue since stopped drinking alcohol 2014   History of kidney stones    History of panic attacks    History of radiation therapy 03/16/2020-05/08/2020   vulva  Dr Antony Blackbird   History of radiation therapy 09/09/2020-10/23/2020   left chest wall/left SCV   Dr Antony Blackbird   Hypertension    Iron deficiency anemia    Left ureteral stone    OCD (obsessive compulsive disorder)    PONV (postoperative nausea and vomiting)    Pre-diabetes    PTSD (post-traumatic stress disorder)    Recovering alcoholic in remission (HCC)    since 2014   RLS (restless legs syndrome)    Smokers' cough (HCC)    Thyroid disease    Urgency of urination    Yeast infection  involving the vagina and surrounding area    secondary to taking antibiotic   Past Surgical History:  Procedure Laterality Date   CESAREAN SECTION  1995   w/  Bilateral Tubal Ligation   COLONOSCOPY  last one 08-09-2013   CYSTOSCOPY W/ URETERAL STENT PLACEMENT Left 03/29/2016   Procedure: CYSTOSCOPY WITH STENT REPLACEMENT;  Surgeon: Hildred Laser, MD;  Location: [facility];  Service: Urology;  Laterality: Left;   CYSTOSCOPY WITH RETROGRADE PYELOGRAM, URETEROSCOPY AND STENT PLACEMENT Left 03/08/2016   Procedure: CYSTOSCOPY WITH  LEFT RETROGRADE PYELOGRAM, AND STENT PLACEMENT;  Surgeon: Hildred Laser, MD;  Location: [facility];  Service: Urology;  Laterality: Left;   CYSTOSCOPY/RETROGRADE/URETEROSCOPY/STONE EXTRACTION WITH BASKET Left 03/29/2016   Procedure: CYSTOSCOPY/RETROGRADE/URETEROSCOPY/STONE EXTRACTION WITH BASKET;  Surgeon: Hildred Laser, MD;  Location: [facility];  Service: Urology;  Laterality: Left;   ENDOMETRIAL ABLATION W/ NOVASURE  04-01-2010   ESOPHAGOGASTRODUODENOSCOPY  last one 08-09-2013   KNEE ARTHROSCOPY Left as teen   LASER ABLATION OF THE CERVIX  2012 approx   MASTECTOMY WITH AXILLARY LYMPH NODE DISSECTION Left 07/27/2020   Procedure:  LEFT MASTECTOMY WITH LEFT RADIOACTIVE SEED GUIDED TARGETED AXILLARY LYMPH NODE DISSECTION;  Surgeon: Ovidio Kin, MD;  Location: [facility];  Service: General;  Laterality: Left;   PORT-A-CATH REMOVAL Right 05/18/2021   Procedure: REMOVAL PORT-A-CATH;  Surgeon: Manus Rudd, MD;  Location: [facility];  Service: General;  Laterality: Right;   PORTACATH PLACEMENT Right 03/06/2020   Procedure: INSERTION PORT-A-CATH WITH ULTRASOUND GUIDANCE;  Surgeon: Ovidio Kin, MD;  Location: [facility];  Service: General;  Laterality: Right;   ROBOTIC ASSISTED TOTAL HYSTERECTOMY WITH BILATERAL SALPINGO OOPHERECTOMY N/A 05/18/2021   Procedure: XI ROBOTIC ASSISTED TOTAL HYSTERECTOMY WITH BILATERAL SALPINGO  OOPHORECTOMY;  Surgeon: Adolphus Birchwood, MD;  Location: [facility];  Service: Gynecology;  Laterality: N/A;   TRANSTHORACIC ECHOCARDIOGRAM  05-19-2006   lvsf normal, ef 55-65%, there was mild flattening of the interventricular septum during diastoli/  RV size at upper limits normal   TUBAL LIGATION     VULVA /PERINEUM BIOPSY N/A 05/18/2021   Procedure: VULVAR BIOPSY;  Surgeon: Adolphus Birchwood, MD;  Location: [facility];  Service: Gynecology;  Laterality: N/A;   WISDOM TOOTH EXTRACTION  age 77 's   Patient Active Problem List   Diagnosis Date Noted   Fatigue 05/15/2023   Prolonged QT interval 04/10/2023   Dizziness 04/10/2023   Provoked seizures (HCC) 03/31/2023   Respiratory depression 03/31/2023   Status epilepticus (HCC) 03/31/2023   Migraine with aura and without status migrainosus, not intractable 10/03/2022   Pain in right knee 09/07/2022   Bilateral primary osteoarthritis of knee 09/07/2022   Chronic migraine without aura without status migrainosus, not intractable 07/07/2022   Graves disease 05/10/2022   Sebaceous cyst 12/06/2021   Pain in left knee 11/03/2021   Hyperthyroidism 08/13/2021   Bipolar 1 disorder (HCC) 05/07/2021   Obesity (BMI 30.0-34.9) 05/07/2021   Hyperlipidemia 08/12/2020   Prediabetes 08/12/2020   Hypomagnesemia 08/12/2020   Mutation in BRIP1 gene 06/24/2020   Hypokalemia 06/17/2020   Dehydration    Colitis 06/16/2020   Port-A-Cath in place 03/11/2020   Malignant neoplasm of upper-inner quadrant of left breast in female, estrogen receptor positive (HCC) 02/25/2020   Vulvar cancer (HCC) 01/29/2020   Vulvar ulceration 01/23/2020   Essential hypertension 10/31/2019   Nausea and vomiting 12/27/2018   Seasonal allergic rhinitis 12/27/2018   History of ELISA positive for HSV 10/01/2018   Restless leg syndrome 04/01/2016   Nephrolithiasis 03/08/2016   Obstructive pyelonephritis 03/08/2016   Normocytic anemia 03/08/2016   Hidradenitis suppurativa of left axilla  11/03/2015   Loss of weight 09/08/2015   Neck pain on left side 10/22/2014   Genital warts 05/20/2014   GERD (gastroesophageal reflux disease) 12/26/2012   Functional dyspepsia 12/26/2012   Tobacco abuse 08/22/2012   Herpes simplex virus (HSV) infection 02/03/2007    PCP: Marcine Matar, MD  REFERRING PROVIDER: Marcine Matar, MD 05/11/23 shoulder referral - Madelyn Brunner, DO  REFERRING DIAG: Weakness [R53.1], Gait disturbance [R26.9]  05/11/23 referral: M75.42 (ICD-10-CM) - Impingement syndrome of left shoulder M25.512,G89.29 (ICD-10-CM) - Chronic left shoulder pain M89.8X1,G89.29 (ICD-10-CM) - Chronic scapular pain  THERAPY DIAG:  Muscle weakness (generalized)  Other abnormalities of gait and mobility  Unsteadiness on feet  Left shoulder pain, unspecified chronicity  Rationale for Evaluation and Treatment: Rehabilitation  ONSET DATE: 03/31/23  SUBJECTIVE:   SUBJECTIVE STATEMENT: Patient reports continued shoulder pain, states injection was not helpful.  PERTINENT HISTORY: NO BP OR IV IN LEFT UE d/t Lymphedema   PMHx includes Arthritis, Bipolar 1, Vulva and  Breast Cancer (currently in remission), Depression, GAD, Graves Disease, Anemia, OCD, pre-diabetes, PTSD, restless leg syndrome, seizure, chronic migraine    PAIN:  Are you having pain? Yes, L shoulder pain Pain best/worst: 8-10/10 Aggravating factors: sitting/standing unsupported Easing factors: having arm supported, medication patches  PRECAUTIONS: Other: Lymphedema Alert - NO BP on LEFT ARM    WEIGHT BEARING RESTRICTIONS: No  FALLS:  Has patient fallen in last 6 months?  "Not that I recall"  LIVING ENVIRONMENT: Lives with: lives with their family Lives in: House/apartment Stairs: No Has following equipment at home: Walker - 4 wheeled, bed side commode, and Grab bars  OCCUPATION: CNA  PLOF: Independent  PATIENT GOALS: "I want to be able to go back to work."   NEXT MD VISIT: 04/26/23  Neurologist, 05/02/23 Orthopedic   OBJECTIVE: (objective measures completed at initial evaluation unless otherwise dated)   DIAGNOSTIC FINDINGS:   04/05/23: HIP UNILAT W or WO Pelvis 2-3 views IMPRESSION: Mild-to-moderate right and mild left femoroacetabular osteoarthritis.  PATIENT SURVEYS:  FOTO 39 current, 56 predicted   COGNITION: Overall cognitive status: Impaired - related to recent changes with memory as noted in subjective assessment      SENSATION: Not tested  POSTURE: rounded shoulders and posterior pelvic tilt   UPPER EXTREMITY ROM:  A/PROM Right 05/15/23 Left 05/15/23  Shoulder flexion 158 deg 122 deg *  Shoulder abduction    Shoulder internal rotation    Shoulder external rotation (functional) C6 C5  Elbow flexion WNL WNL  Elbow extension WNL WNL  Wrist flexion    Wrist extension     (Blank rows = not tested) (Key: WFL = within functional limits not formally assessed, * = concordant pain, s = stiffness/stretching sensation, NT = not tested)  Comments:    LOWER EXTREMITY MMT:  MMT Right eval Left eval  Hip flexion 3+ 3+  Hip extension 4- 4-  Hip abduction 4- 4-  Hip adduction    Hip internal rotation 3+ 4-  Hip external rotation 3+ 4-  Knee flexion 3+ 4-  Knee extension 3+ 3+  Ankle dorsiflexion 4+ 4+  Ankle plantarflexion    Ankle inversion    Ankle eversion     (Blank rows = not tested)  UPPER EXTREMITY MMT:  MMT Right 05/15/23 Left 05/15/23  Shoulder flexion 4 4 s  Shoulder extension    Shoulder abduction 4+ 4+  Shoulder extension    Shoulder internal rotation 4 4-  Shoulder external rotation 4 4  Elbow flexion    Elbow extension    Grip strength    (Blank rows = not tested)  (Key: WFL = within functional limits not formally assessed, * = concordant pain, s = stiffness/stretching sensation, NT = not tested)  Comments:    FUNCTIONAL TESTS:  Timed up and go (TUG): TBD  Balance    Romberg EO on floor  30 sec, Min sway  Romberg EC on  floor 30 sec, min sway  Romberg EO on airex 30 sec, mod sway  Romberg EC on airex  30 sec, mod sway     Balance Right Left   Tandem on floor 30 sec 30 sec  SLS on floor  11 sec 7 sec    GAIT: Distance walked: 50 feet Assistive device utilized: None Level of assistance: SBA Comments: slow gait speed - BIL small step length with decreased stance time    TODAY'S TREATMENT [facility] Adult PT Treatment:  DATE: 05/17/23 Therapeutic Exercise: Nustep level 4 x 5 mins (100 steps) LE only Rows RTB 2x10 Shoulder extension RTB 2x10 ER/IR RTB Lt 2x10 ea Standing marches x8 BIL with counter support  Standing heel raises 2x10 with counter support Hip 45 deg kickback x10 BIL cues for alignment Shoulder rolls fwd x10 each cues for pacing and ROM Bridges 2x10 SLR x10 BIL Supine hip adduction ball squeeze 5" hold 2x10 Sidelying clamshell 2x10 BIL   [facility] Adult PT Treatment:                                                DATE: 05/15/23 Therapeutic Exercise: Standing marches x8 BIL with counter support  Standing heel raises x10 with counter support Hip 45 deg kickback x8 BIL cues for alignment Shoulder rolls fwd/back x10 each cues for pacing and ROM Shoulder retraction x8 cues for posture HEP update to include shoulder, education on appropriate performance  Therapeutic Activity: MSK assessment + education as relates to new referral for shoulder - education/discussion re: PT POC and goals    [facility] Adult PT Treatment:                                                DATE: 05/03/23 Therapeutic Exercise: Nustep level 4 x 5 mins (100 steps) Seated marching 2x30" Seated heel/toe raises 2# 2x10 Standing heel raises UE support 2x10 Standing hip abduction 2x10 BIL LAQ 2# 2x10 BIL Seated hamstring curl GTB 2x10 BIL Bridges 2x10 SLR x10 BIL Supine hip adduction ball squeeze 5" hold 2x10 Sidelying clamshell 2x10 BIL                                                                                                                    PATIENT EDUCATION:  Education details: rationale for interventions, PT exam findings and POC  Person educated: Patient Education method: Medical illustrator Education comprehension: verbalized understanding, returned demonstration, and needs further education  HOME EXERCISE PROGRAM: Access Code: [email or web address] URL: [email or web address] Date: 05/15/2023 Prepared by: Fransisco Hertz  Exercises - Sit to Stand with Armchair  - 2-3 x daily - 7 x weekly - 1 sets - 5 reps - Heel Raises with Counter Support  - 2-3 x daily - 7 x weekly - 1 sets - 8 reps - Seated Scapular Retraction  - 2-3 x daily - 7 x weekly - 1 sets - 8 reps  ASSESSMENT:  CLINICAL IMPRESSION: Patient presents to PT reporting continued Lt shoulder pain and overall limitation by fatigue. Session today focused on periscapular, RTC, proximal hip, and LE strengthening to decrease pain and improve functional mobility. Patient continues to benefit from skilled PT services and should be progressed as able to improve functional independence.    OBJECTIVE  IMPAIRMENTS: Abnormal gait, decreased activity tolerance, decreased balance, decreased endurance, decreased mobility, difficulty walking, decreased strength, impaired perceived functional ability, improper body mechanics, postural dysfunction, and pain.   ACTIVITY LIMITATIONS: carrying, lifting, standing, squatting, stairs, transfers, bed mobility, locomotion level, and caring for others  PARTICIPATION LIMITATIONS: cleaning, community activity, and occupation  PERSONAL FACTORS: Past/current experiences, Time since onset of injury/illness/exacerbation, and 3+ comorbidities: PMHx includes Arthritis, Bipolar 1, Vulva and Breast Cancer (currently in remission), Depression, GAD, Graves Disease, Anemia, OCD, pre-diabetes, PTSD, restless leg syndrome, seizure, chronic migraine   are also affecting  patient's functional outcome.   REHAB POTENTIAL: Fair    CLINICAL DECISION MAKING: Evolving/moderate complexity  EVALUATION COMPLEXITY: Moderate   GOALS: Goals reviewed with patient? Yes  SHORT TERM GOALS: Target date: 05/16/2023   Patient will be independent with initial home program for LE strengthening and balance . Baseline: to be initiated at follow up visit  05/15/23: HEP established Goal status: ONGOING  2.  Patient will ambulate 6 minutes with LRAD without LOB.  Baseline: requires RW, unsteady with walking >50-163ft 05/15/23: ambulates without device within clinic, no instability or LOB noted Goal status: ONGOING   LONG TERM GOALS: Target date: 06/26/2023 (updated 05/15/23 given updated referral to include shoulder) Patient will report improved overall functional ability with FOTO score of 56 predicted.  Baseline: 39 current Goal status: INITIAL  2.  Patient will perform SLS at least 20 sec on each LE in order to maximize safe and independent function.  Baseline:  Balance Right Left   Tandem on floor 30 sec 30 sec  SLS on floor  11 sec 7 sec   Goal status: INITIAL  3.  Patient will demonstrate at least 4 or 4+/5 MMT with all LE strength testing.   Baseline:  MMT Right eval Left eval  Hip flexion 3+ 3+  Hip extension 4- 4-  Hip abduction 4- 4-  Hip adduction    Hip internal rotation 3+ 4-  Hip external rotation 3+ 4-  Knee flexion 3+ 4-  Knee extension 3+ 3+  Ankle dorsiflexion 4+ 4+   Goal status: INITIAL  4.  Patient will demonstrate ability to walk 6+ minutes at normal gait speed without AD or LOB.  Baseline: requires RW  Goal status: INITIAL  5.  Patient will demonstrate ability to ambulate over varied surfaces, including inclines without LOB and no more than minimal fatigue after 15 minutes of weight bearing activities.  Baseline: unable  Goal status: INITIAL  6. Pt will demonstrate at least 145 deg L shoulder elevation AROM in order to facilitate  improved tolerance to daily tasks.  Baseline: see ROM chart above  Goal status: NEW 05/15/23  7. Pt will report at least 50% decrease in overall pain levels in past week in order to facilitate improved tolerance to basic ADLs/mobility.   Baseline: 7-10/10  Goal status: NEW 05/15/23     PLAN: updated 05/15/23 to include shoulder POC  PT FREQUENCY: 1-2x/week    PT DURATION: 6 weeks  PLANNED INTERVENTIONS: Therapeutic exercises, Therapeutic activity, Neuromuscular re-education, Balance training, Gait training, Patient/Family education, Self Care, Joint mobilization, Electrical stimulation, Cryotherapy, Moist heat, Manual therapy, and Re-evaluation  PLAN FOR NEXT SESSION: balance (narrow BOS, SLS, uneven surfaces), gait activities encouraging increased SLS time, reactive balance, BIL LE strengthening. Postural mobility and GH mobility within pt tolerance, would likely benefit from gradual IR strengthening and scapular work    Berta Minor PTA 05/17/2023 4:11 PM

## 2023-05-17 NOTE — Telephone Encounter (Signed)
Copied from CRM [id number]. Topic: General - Other >> May 17, 2023 12:56 PM Lennox Pippins wrote: Patient called and stated her daughter Gavin Potters will be coming by to pick the letter for the patient today,  05/17/2023. She wanted to make the office aware.

## 2023-05-18 ENCOUNTER — Inpatient Hospital Stay: Payer: PRIVATE HEALTH INSURANCE

## 2023-05-18 DIAGNOSIS — C50212 Malignant neoplasm of upper-inner quadrant of left female breast: Secondary | ICD-10-CM | POA: Diagnosis not present

## 2023-05-18 DIAGNOSIS — T733XXA Exhaustion due to excessive exertion, initial encounter: Secondary | ICD-10-CM

## 2023-05-18 MED ORDER — CYANOCOBALAMIN 1000 MCG/ML IJ SOLN
1000.0000 ug | Freq: Once | INTRAMUSCULAR | Status: AC
  Administered 2023-05-18: 1000 ug via INTRAMUSCULAR
  Filled 2023-05-18: qty 1

## 2023-05-18 NOTE — Telephone Encounter (Signed)
Noted  

## 2023-05-19 NOTE — Therapy (Signed)
OUTPATIENT PHYSICAL THERAPY TREATMENT NOTE  Patient Name: GWYNEVERE MONTEL MRN: [id number] DOB:11-03-1975, 47 y.o., female Today's Date: 05/22/2023  END OF SESSION:  PT End of Session - 05/22/23 1534     Visit Number 5    Number of Visits 13    Date for PT Re-Evaluation 06/26/23    Authorization Type [employer]    PT Start Time 1535   late check in   PT Stop Time 1610    PT Time Calculation (min) 35 min    Activity Tolerance Patient tolerated treatment well;Patient limited by fatigue    Behavior During Therapy WFL for tasks assessed/performed              Past Medical History:  Diagnosis Date   Arthritis    Bipolar 1 disorder (HCC)    Cancer (HCC)    vulva, and breast   Complication of anesthesia    wakes up during procedures   Depression    GAD (generalized anxiety disorder)    Genital HSV    currently per pt  no break out 03-22-2016    GERD (gastroesophageal reflux disease)    Graves disease    Hiatal hernia    History of cervical dysplasia    2012 laser ablation   History of esophageal dilatation    for dysphasia -- x2 dilated   History of gastric ulcer    History of Helicobacter pylori infection    remote hx   History of hidradenitis suppurativa    "gets all over body intermittantly"     History of hypertension    no issue since stopped drinking alcohol 2014   History of kidney stones    History of panic attacks    History of radiation therapy 03/16/2020-05/08/2020   vulva  Dr Antony Blackbird   History of radiation therapy 09/09/2020-10/23/2020   left chest wall/left SCV   Dr Antony Blackbird   Hypertension    Iron deficiency anemia    Left ureteral stone    OCD (obsessive compulsive disorder)    PONV (postoperative nausea and vomiting)    Pre-diabetes    PTSD (post-traumatic stress disorder)    Recovering alcoholic in remission (HCC)    since 2014   RLS (restless legs syndrome)    Smokers' cough (HCC)    Thyroid disease    Urgency of urination     Yeast infection involving the vagina and surrounding area    secondary to taking antibiotic   Past Surgical History:  Procedure Laterality Date   CESAREAN SECTION  1995   w/  Bilateral Tubal Ligation   COLONOSCOPY  last one 08-09-2013   CYSTOSCOPY W/ URETERAL STENT PLACEMENT Left 03/29/2016   Procedure: CYSTOSCOPY WITH STENT REPLACEMENT;  Surgeon: Hildred Laser, MD;  Location: [facility];  Service: Urology;  Laterality: Left;   CYSTOSCOPY WITH RETROGRADE PYELOGRAM, URETEROSCOPY AND STENT PLACEMENT Left 03/08/2016   Procedure: CYSTOSCOPY WITH  LEFT RETROGRADE PYELOGRAM, AND STENT PLACEMENT;  Surgeon: Hildred Laser, MD;  Location: [facility];  Service: Urology;  Laterality: Left;   CYSTOSCOPY/RETROGRADE/URETEROSCOPY/STONE EXTRACTION WITH BASKET Left 03/29/2016   Procedure: CYSTOSCOPY/RETROGRADE/URETEROSCOPY/STONE EXTRACTION WITH BASKET;  Surgeon: Hildred Laser, MD;  Location: [facility];  Service: Urology;  Laterality: Left;   ENDOMETRIAL ABLATION W/ NOVASURE  04-01-2010   ESOPHAGOGASTRODUODENOSCOPY  last one 08-09-2013   KNEE ARTHROSCOPY Left as teen   LASER ABLATION OF THE CERVIX  2012 approx   MASTECTOMY WITH AXILLARY LYMPH NODE DISSECTION  Left 07/27/2020   Procedure: LEFT MASTECTOMY WITH LEFT RADIOACTIVE SEED GUIDED TARGETED AXILLARY LYMPH NODE DISSECTION;  Surgeon: Ovidio Kin, MD;  Location: [facility];  Service: General;  Laterality: Left;   PORT-A-CATH REMOVAL Right 05/18/2021   Procedure: REMOVAL PORT-A-CATH;  Surgeon: Manus Rudd, MD;  Location: [facility];  Service: General;  Laterality: Right;   PORTACATH PLACEMENT Right 03/06/2020   Procedure: INSERTION PORT-A-CATH WITH ULTRASOUND GUIDANCE;  Surgeon: Ovidio Kin, MD;  Location: [facility];  Service: General;  Laterality: Right;   ROBOTIC ASSISTED TOTAL HYSTERECTOMY WITH BILATERAL SALPINGO OOPHERECTOMY N/A 05/18/2021   Procedure: XI ROBOTIC ASSISTED TOTAL HYSTERECTOMY WITH BILATERAL  SALPINGO OOPHORECTOMY;  Surgeon: Adolphus Birchwood, MD;  Location: [facility];  Service: Gynecology;  Laterality: N/A;   TRANSTHORACIC ECHOCARDIOGRAM  05-19-2006   lvsf normal, ef 55-65%, there was mild flattening of the interventricular septum during diastoli/  RV size at upper limits normal   TUBAL LIGATION     VULVA /PERINEUM BIOPSY N/A 05/18/2021   Procedure: VULVAR BIOPSY;  Surgeon: Adolphus Birchwood, MD;  Location: [facility];  Service: Gynecology;  Laterality: N/A;   WISDOM TOOTH EXTRACTION  age 78 's   Patient Active Problem List   Diagnosis Date Noted   Fatigue 05/15/2023   Prolonged QT interval 04/10/2023   Dizziness 04/10/2023   Provoked seizures (HCC) 03/31/2023   Respiratory depression 03/31/2023   Status epilepticus (HCC) 03/31/2023   Migraine with aura and without status migrainosus, not intractable 10/03/2022   Pain in right knee 09/07/2022   Bilateral primary osteoarthritis of knee 09/07/2022   Chronic migraine without aura without status migrainosus, not intractable 07/07/2022   Graves disease 05/10/2022   Sebaceous cyst 12/06/2021   Pain in left knee 11/03/2021   Hyperthyroidism 08/13/2021   Bipolar 1 disorder (HCC) 05/07/2021   Obesity (BMI 30.0-34.9) 05/07/2021   Hyperlipidemia 08/12/2020   Prediabetes 08/12/2020   Hypomagnesemia 08/12/2020   Mutation in BRIP1 gene 06/24/2020   Hypokalemia 06/17/2020   Dehydration    Colitis 06/16/2020   Port-A-Cath in place 03/11/2020   Malignant neoplasm of upper-inner quadrant of left breast in female, estrogen receptor positive (HCC) 02/25/2020   Vulvar cancer (HCC) 01/29/2020   Vulvar ulceration 01/23/2020   Essential hypertension 10/31/2019   Nausea and vomiting 12/27/2018   Seasonal allergic rhinitis 12/27/2018   History of ELISA positive for HSV 10/01/2018   Restless leg syndrome 04/01/2016   Nephrolithiasis 03/08/2016   Obstructive pyelonephritis 03/08/2016   Normocytic anemia 03/08/2016   Hidradenitis suppurativa of left axilla  11/03/2015   Loss of weight 09/08/2015   Neck pain on left side 10/22/2014   Genital warts 05/20/2014   GERD (gastroesophageal reflux disease) 12/26/2012   Functional dyspepsia 12/26/2012   Tobacco abuse 08/22/2012   Herpes simplex virus (HSV) infection 02/03/2007    PCP: Marcine Matar, MD  REFERRING PROVIDER: Marcine Matar, MD 05/11/23 shoulder referral - Madelyn Brunner, DO  REFERRING DIAG: Weakness [R53.1], Gait disturbance [R26.9]  05/11/23 referral: M75.42 (ICD-10-CM) - Impingement syndrome of left shoulder M25.512,G89.29 (ICD-10-CM) - Chronic left shoulder pain M89.8X1,G89.29 (ICD-10-CM) - Chronic scapular pain  THERAPY DIAG:  Muscle weakness (generalized)  Other abnormalities of gait and mobility  Unsteadiness on feet  Left shoulder pain, unspecified chronicity  Rationale for Evaluation and Treatment: Rehabilitation  ONSET DATE: 03/31/23  SUBJECTIVE:   SUBJECTIVE STATEMENT: States her shoulder was a little irritated after last session. Continues to endorse significant fatigue, did get a B12 injection last week - pt is hopeful  this will improve energy levels. States she is more fatigued than usual which seems to be affecting her balance today. No other new updates, sees oncologist office on Thursday for another injection  PERTINENT HISTORY: NO BP OR IV IN LEFT UE d/t Lymphedema   PMHx includes Arthritis, Bipolar 1, Vulva and Breast Cancer (currently in remission), Depression, GAD, Graves Disease, Anemia, OCD, pre-diabetes, PTSD, restless leg syndrome, seizure, chronic migraine    PAIN:  Are you having pain? Yes, L shoulder pain 8/10  Pain best/worst: 8-10/10 Aggravating factors: sitting/standing unsupported Easing factors: having arm supported, medication patches  PRECAUTIONS: Other: Lymphedema Alert - NO BP on LEFT ARM    WEIGHT BEARING RESTRICTIONS: No  FALLS:  Has patient fallen in last 6 months?  "Not that I recall"  LIVING ENVIRONMENT: Lives  with: lives with their family Lives in: House/apartment Stairs: No Has following equipment at home: Walker - 4 wheeled, bed side commode, and Grab bars  OCCUPATION: CNA  PLOF: Independent  PATIENT GOALS: "I want to be able to go back to work."   NEXT MD VISIT: 04/26/23 Neurologist, 05/02/23 Orthopedic   OBJECTIVE: (objective measures completed at initial evaluation unless otherwise dated)   DIAGNOSTIC FINDINGS:   04/05/23: HIP UNILAT W or WO Pelvis 2-3 views IMPRESSION: Mild-to-moderate right and mild left femoroacetabular osteoarthritis.  PATIENT SURVEYS:  FOTO 39 current, 56 predicted   COGNITION: Overall cognitive status: Impaired - related to recent changes with memory as noted in subjective assessment      SENSATION: Not tested  POSTURE: rounded shoulders and posterior pelvic tilt   UPPER EXTREMITY ROM:  A/PROM Right 05/15/23 Left 05/15/23  Shoulder flexion 158 deg 122 deg *  Shoulder abduction    Shoulder internal rotation    Shoulder external rotation (functional) C6 C5  Elbow flexion WNL WNL  Elbow extension WNL WNL  Wrist flexion    Wrist extension     (Blank rows = not tested) (Key: WFL = within functional limits not formally assessed, * = concordant pain, s = stiffness/stretching sensation, NT = not tested)  Comments:    LOWER EXTREMITY MMT:  MMT Right eval Left eval  Hip flexion 3+ 3+  Hip extension 4- 4-  Hip abduction 4- 4-  Hip adduction    Hip internal rotation 3+ 4-  Hip external rotation 3+ 4-  Knee flexion 3+ 4-  Knee extension 3+ 3+  Ankle dorsiflexion 4+ 4+  Ankle plantarflexion    Ankle inversion    Ankle eversion     (Blank rows = not tested)  UPPER EXTREMITY MMT:  MMT Right 05/15/23 Left 05/15/23  Shoulder flexion 4 4 s  Shoulder extension    Shoulder abduction 4+ 4+  Shoulder extension    Shoulder internal rotation 4 4-  Shoulder external rotation 4 4  Elbow flexion    Elbow extension    Grip strength    (Blank rows =  not tested)  (Key: WFL = within functional limits not formally assessed, * = concordant pain, s = stiffness/stretching sensation, NT = not tested)  Comments:    FUNCTIONAL TESTS:  Timed up and go (TUG): TBD  Balance    Romberg EO on floor  30 sec, Min sway  Romberg EC on floor 30 sec, min sway  Romberg EO on airex 30 sec, mod sway  Romberg EC on airex  30 sec, mod sway     Balance Right Left   Tandem on floor 30 sec 30 sec  SLS  on floor  11 sec 7 sec    GAIT: Distance walked: 50 feet Assistive device utilized: None Level of assistance: SBA Comments: slow gait speed - BIL small step length with decreased stance time    TODAY'S TREATMENT [facility] Adult PT Treatment:                                                DATE: 05/22/23 Therapeutic Exercise: Red band row x10 cues for reduced elbow compensations Red band shoulder ext x10 cues for posture Red band ER/IR x10 each cues for setup  Eyes closed static stance 30sec CGA Eyes open/closed on airex static stance 2x30sec each CGA for safety, ski pole to assist with getting onto airex Standing march x8 BIL no UE support, CGA for safety and emphasis on slow pacing, upright posture    [facility] Adult PT Treatment:                                                DATE: 05/17/23 Therapeutic Exercise: Nustep level 4 x 5 mins (100 steps) LE only Rows RTB 2x10 Shoulder extension RTB 2x10 ER/IR RTB Lt 2x10 ea Standing marches x8 BIL with counter support  Standing heel raises 2x10 with counter support Hip 45 deg kickback x10 BIL cues for alignment Shoulder rolls fwd x10 each cues for pacing and ROM Bridges 2x10 SLR x10 BIL Supine hip adduction ball squeeze 5" hold 2x10 Sidelying clamshell 2x10 BIL   [facility] Adult PT Treatment:                                                DATE: 05/15/23 Therapeutic Exercise: Standing marches x8 BIL with counter support  Standing heel raises x10 with counter support Hip 45 deg kickback x8 BIL cues for  alignment Shoulder rolls fwd/back x10 each cues for pacing and ROM Shoulder retraction x8 cues for posture HEP update to include shoulder, education on appropriate performance  Therapeutic Activity: MSK assessment + education as relates to new referral for shoulder - education/discussion re: PT POC and goals    [facility] Adult PT Treatment:                                                DATE: 05/03/23 Therapeutic Exercise: Nustep level 4 x 5 mins (100 steps) Seated marching 2x30" Seated heel/toe raises 2# 2x10 Standing heel raises UE support 2x10 Standing hip abduction 2x10 BIL LAQ 2# 2x10 BIL Seated hamstring curl GTB 2x10 BIL Bridges 2x10 SLR x10 BIL Supine hip adduction ball squeeze 5" hold 2x10 Sidelying clamshell 2x10 BIL  PATIENT EDUCATION:  Education details: rationale for interventions, PT exam findings and POC  Person educated: Patient Education method: Medical illustrator Education comprehension: verbalized understanding, returned demonstration, and needs further education  HOME EXERCISE PROGRAM: Access Code: [email or web address] URL: [email or web address] Date: 05/15/2023 Prepared by: Fransisco Hertz  Exercises - Sit to Stand with Armchair  - 2-3 x daily - 7 x weekly - 1 sets - 5 reps - Heel Raises with Counter Support  - 2-3 x daily - 7 x weekly - 1 sets - 8 reps - Seated Scapular Retraction  - 2-3 x daily - 7 x weekly - 1 sets - 8 reps  ASSESSMENT:  CLINICAL IMPRESSION: Pt arrives w/ 8/10 L shoulder pain and increased fatigue. Given report of increased shoulder pain after last session, today is modified for reduced overall volume with shoulder/periscapular work - pt endorses transient irritability but notes no increased pain/soreness after. Also continuing to work on exercises with eyes closed and on unstable surfaces - noted sway and fatigue  but no overt LOB, CGA throughout for safety. Primarily limited today by fatigue - encouraged pt to continue monitoring and reach out to PCP/oncology with any concerns. No adverse events. Recommend continuing along current POC in order to address relevant deficits and improve functional tolerance. Pt departs today's session in no acute distress, all voiced questions/concerns addressed appropriately from PT perspective.     OBJECTIVE IMPAIRMENTS: Abnormal gait, decreased activity tolerance, decreased balance, decreased endurance, decreased mobility, difficulty walking, decreased strength, impaired perceived functional ability, improper body mechanics, postural dysfunction, and pain.   ACTIVITY LIMITATIONS: carrying, lifting, standing, squatting, stairs, transfers, bed mobility, locomotion level, and caring for others  PARTICIPATION LIMITATIONS: cleaning, community activity, and occupation  PERSONAL FACTORS: Past/current experiences, Time since onset of injury/illness/exacerbation, and 3+ comorbidities: PMHx includes Arthritis, Bipolar 1, Vulva and Breast Cancer (currently in remission), Depression, GAD, Graves Disease, Anemia, OCD, pre-diabetes, PTSD, restless leg syndrome, seizure, chronic migraine   are also affecting patient's functional outcome.   REHAB POTENTIAL: Fair    CLINICAL DECISION MAKING: Evolving/moderate complexity  EVALUATION COMPLEXITY: Moderate   GOALS: Goals reviewed with patient? Yes  SHORT TERM GOALS: Target date: 05/16/2023   Patient will be independent with initial home program for LE strengthening and balance . Baseline: to be initiated at follow up visit  05/15/23: HEP established Goal status: ONGOING  2.  Patient will ambulate 6 minutes with LRAD without LOB.  Baseline: requires RW, unsteady with walking >50-171ft 05/15/23: ambulates without device within clinic, no instability or LOB noted Goal status: ONGOING   LONG TERM GOALS: Target date: 06/26/2023 (updated  05/15/23 given updated referral to include shoulder) Patient will report improved overall functional ability with FOTO score of 56 predicted.  Baseline: 39 current Goal status: INITIAL  2.  Patient will perform SLS at least 20 sec on each LE in order to maximize safe and independent function.  Baseline:  Balance Right Left   Tandem on floor 30 sec 30 sec  SLS on floor  11 sec 7 sec   Goal status: INITIAL  3.  Patient will demonstrate at least 4 or 4+/5 MMT with all LE strength testing.   Baseline:  MMT Right eval Left eval  Hip flexion 3+ 3+  Hip extension 4- 4-  Hip abduction 4- 4-  Hip adduction    Hip internal rotation 3+ 4-  Hip external rotation 3+ 4-  Knee flexion 3+ 4-  Knee extension 3+ 3+  Ankle dorsiflexion 4+ 4+  Goal status: INITIAL  4.  Patient will demonstrate ability to walk 6+ minutes at normal gait speed without AD or LOB.  Baseline: requires RW  Goal status: INITIAL  5.  Patient will demonstrate ability to ambulate over varied surfaces, including inclines without LOB and no more than minimal fatigue after 15 minutes of weight bearing activities.  Baseline: unable  Goal status: INITIAL  6. Pt will demonstrate at least 145 deg L shoulder elevation AROM in order to facilitate improved tolerance to daily tasks.  Baseline: see ROM chart above  Goal status: NEW 05/15/23  7. Pt will report at least 50% decrease in overall pain levels in past week in order to facilitate improved tolerance to basic ADLs/mobility.   Baseline: 7-10/10  Goal status: NEW 05/15/23     PLAN: updated 05/15/23 to include shoulder POC  PT FREQUENCY: 1-2x/week    PT DURATION: 6 weeks  PLANNED INTERVENTIONS: Therapeutic exercises, Therapeutic activity, Neuromuscular re-education, Balance training, Gait training, Patient/Family education, Self Care, Joint mobilization, Electrical stimulation, Cryotherapy, Moist heat, Manual therapy, and Re-evaluation  PLAN FOR NEXT SESSION: balance  (narrow BOS, SLS, uneven surfaces), gait activities encouraging increased SLS time, reactive balance, BIL LE strengthening. Postural mobility and GH mobility within pt tolerance, would likely benefit from gradual IR strengthening and scapular work. HEP update    Ashley Murrain PT, DPT 05/22/2023 4:20 PM

## 2023-05-22 ENCOUNTER — Encounter: Payer: Self-pay | Admitting: Physical Therapy

## 2023-05-22 ENCOUNTER — Ambulatory Visit: Payer: PRIVATE HEALTH INSURANCE | Admitting: Physical Therapy

## 2023-05-22 DIAGNOSIS — R2689 Other abnormalities of gait and mobility: Secondary | ICD-10-CM

## 2023-05-22 DIAGNOSIS — M25512 Pain in left shoulder: Secondary | ICD-10-CM

## 2023-05-22 DIAGNOSIS — M6281 Muscle weakness (generalized): Secondary | ICD-10-CM

## 2023-05-22 DIAGNOSIS — R2681 Unsteadiness on feet: Secondary | ICD-10-CM

## 2023-05-23 ENCOUNTER — Ambulatory Visit: Payer: PRIVATE HEALTH INSURANCE

## 2023-05-24 ENCOUNTER — Ambulatory Visit: Payer: PRIVATE HEALTH INSURANCE

## 2023-05-24 DIAGNOSIS — R2681 Unsteadiness on feet: Secondary | ICD-10-CM

## 2023-05-24 DIAGNOSIS — M6281 Muscle weakness (generalized): Secondary | ICD-10-CM | POA: Diagnosis not present

## 2023-05-24 DIAGNOSIS — R2689 Other abnormalities of gait and mobility: Secondary | ICD-10-CM

## 2023-05-24 DIAGNOSIS — M25512 Pain in left shoulder: Secondary | ICD-10-CM

## 2023-05-24 NOTE — Therapy (Signed)
OUTPATIENT PHYSICAL THERAPY TREATMENT NOTE  Patient Name: Sandra Miller MRN: [id number] DOB:[birth year], 47 y.o., female Today's Date: 05/24/2023  END OF SESSION:  PT End of Session - 05/24/23 1526     Visit Number 6    Number of Visits 13    Date for PT Re-Evaluation 06/26/23    Authorization Type [employer]    PT Start Time 1530    PT Stop Time 1601    PT Time Calculation (min) 31 min    Activity Tolerance Patient tolerated treatment well;Patient limited by fatigue    Behavior During Therapy WFL for tasks assessed/performed              Past Medical History:  Diagnosis Date   Arthritis    Bipolar 1 disorder (HCC)    Cancer (HCC)    vulva, and breast   Complication of anesthesia    wakes up during procedures   Depression    GAD (generalized anxiety disorder)    Genital HSV    currently per pt  no break out 03-22-2016    GERD (gastroesophageal reflux disease)    Graves disease    Hiatal hernia    History of cervical dysplasia    2012 laser ablation   History of esophageal dilatation    for dysphasia -- x2 dilated   History of gastric ulcer    History of Helicobacter pylori infection    remote hx   History of hidradenitis suppurativa    "gets all over body intermittantly"     History of hypertension    no issue since stopped drinking alcohol 2014   History of kidney stones    History of panic attacks    History of radiation therapy 03/16/2020-05/08/2020   vulva  Dr Antony Blackbird   History of radiation therapy 09/09/2020-10/23/2020   left chest wall/left SCV   Dr Antony Blackbird   Hypertension    Iron deficiency anemia    Left ureteral stone    OCD (obsessive compulsive disorder)    PONV (postoperative nausea and vomiting)    Pre-diabetes    PTSD (post-traumatic stress disorder)    Recovering alcoholic in remission (HCC)    since 2014   RLS (restless legs syndrome)    Smokers' cough (HCC)    Thyroid disease    Urgency of urination    Yeast infection  involving the vagina and surrounding area    secondary to taking antibiotic   Past Surgical History:  Procedure Laterality Date   CESAREAN SECTION  1995   w/  Bilateral Tubal Ligation   COLONOSCOPY  last one 08-09-2013   CYSTOSCOPY W/ URETERAL STENT PLACEMENT Left 03/29/2016   Procedure: CYSTOSCOPY WITH STENT REPLACEMENT;  Surgeon: Hildred Laser, MD;  Location: [facility];  Service: Urology;  Laterality: Left;   CYSTOSCOPY WITH RETROGRADE PYELOGRAM, URETEROSCOPY AND STENT PLACEMENT Left 03/08/2016   Procedure: CYSTOSCOPY WITH  LEFT RETROGRADE PYELOGRAM, AND STENT PLACEMENT;  Surgeon: Hildred Laser, MD;  Location: [facility];  Service: Urology;  Laterality: Left;   CYSTOSCOPY/RETROGRADE/URETEROSCOPY/STONE EXTRACTION WITH BASKET Left 03/29/2016   Procedure: CYSTOSCOPY/RETROGRADE/URETEROSCOPY/STONE EXTRACTION WITH BASKET;  Surgeon: Hildred Laser, MD;  Location: [facility];  Service: Urology;  Laterality: Left;   ENDOMETRIAL ABLATION W/ NOVASURE  04-01-2010   ESOPHAGOGASTRODUODENOSCOPY  last one 08-09-2013   KNEE ARTHROSCOPY Left as teen   LASER ABLATION OF THE CERVIX  2012 approx   MASTECTOMY WITH AXILLARY LYMPH NODE DISSECTION Left 07/27/2020  Procedure: LEFT MASTECTOMY WITH LEFT RADIOACTIVE SEED GUIDED TARGETED AXILLARY LYMPH NODE DISSECTION;  Surgeon: Ovidio Kin, MD;  Location: [facility];  Service: General;  Laterality: Left;   PORT-A-CATH REMOVAL Right 05/18/2021   Procedure: REMOVAL PORT-A-CATH;  Surgeon: Manus Rudd, MD;  Location: [facility];  Service: General;  Laterality: Right;   PORTACATH PLACEMENT Right 03/06/2020   Procedure: INSERTION PORT-A-CATH WITH ULTRASOUND GUIDANCE;  Surgeon: Ovidio Kin, MD;  Location: [facility];  Service: General;  Laterality: Right;   ROBOTIC ASSISTED TOTAL HYSTERECTOMY WITH BILATERAL SALPINGO OOPHERECTOMY N/A 05/18/2021   Procedure: XI ROBOTIC ASSISTED TOTAL HYSTERECTOMY WITH BILATERAL SALPINGO  OOPHORECTOMY;  Surgeon: Adolphus Birchwood, MD;  Location: [facility];  Service: Gynecology;  Laterality: N/A;   TRANSTHORACIC ECHOCARDIOGRAM  05-19-2006   lvsf normal, ef 55-65%, there was mild flattening of the interventricular septum during diastoli/  RV size at upper limits normal   TUBAL LIGATION     VULVA /PERINEUM BIOPSY N/A 05/18/2021   Procedure: VULVAR BIOPSY;  Surgeon: Adolphus Birchwood, MD;  Location: [facility];  Service: Gynecology;  Laterality: N/A;   WISDOM TOOTH EXTRACTION  age 7 's   Patient Active Problem List   Diagnosis Date Noted   Fatigue 05/15/2023   Prolonged QT interval 04/10/2023   Dizziness 04/10/2023   Provoked seizures (HCC) 03/31/2023   Respiratory depression 03/31/2023   Status epilepticus (HCC) 03/31/2023   Migraine with aura and without status migrainosus, not intractable 10/03/2022   Pain in right knee 09/07/2022   Bilateral primary osteoarthritis of knee 09/07/2022   Chronic migraine without aura without status migrainosus, not intractable 07/07/2022   Graves disease 05/10/2022   Sebaceous cyst 12/06/2021   Pain in left knee 11/03/2021   Hyperthyroidism 08/13/2021   Bipolar 1 disorder (HCC) 05/07/2021   Obesity (BMI 30.0-34.9) 05/07/2021   Hyperlipidemia 08/12/2020   Prediabetes 08/12/2020   Hypomagnesemia 08/12/2020   Mutation in BRIP1 gene 06/24/2020   Hypokalemia 06/17/2020   Dehydration    Colitis 06/16/2020   Port-A-Cath in place 03/11/2020   Malignant neoplasm of upper-inner quadrant of left breast in female, estrogen receptor positive (HCC) 02/25/2020   Vulvar cancer (HCC) 01/29/2020   Vulvar ulceration 01/23/2020   Essential hypertension 10/31/2019   Nausea and vomiting 12/27/2018   Seasonal allergic rhinitis 12/27/2018   History of ELISA positive for HSV 10/01/2018   Restless leg syndrome 04/01/2016   Nephrolithiasis 03/08/2016   Obstructive pyelonephritis 03/08/2016   Normocytic anemia 03/08/2016   Hidradenitis suppurativa of left axilla  11/03/2015   Loss of weight 09/08/2015   Neck pain on left side 10/22/2014   Genital warts 05/20/2014   GERD (gastroesophageal reflux disease) 12/26/2012   Functional dyspepsia 12/26/2012   Tobacco abuse 08/22/2012   Herpes simplex virus (HSV) infection 02/03/2007    PCP: Marcine Matar, MD  REFERRING PROVIDER: Marcine Matar, MD 05/11/23 shoulder referral - Madelyn Brunner, DO  REFERRING DIAG: Weakness [R53.1], Gait disturbance [R26.9]  05/11/23 referral: M75.42 (ICD-10-CM) - Impingement syndrome of left shoulder M25.512,G89.29 (ICD-10-CM) - Chronic left shoulder pain M89.8X1,G89.29 (ICD-10-CM) - Chronic scapular pain  THERAPY DIAG:  Muscle weakness (generalized)  Other abnormalities of gait and mobility  Unsteadiness on feet  Left shoulder pain, unspecified chronicity  Rationale for Evaluation and Treatment: Rehabilitation  ONSET DATE: 03/31/23  SUBJECTIVE:   SUBJECTIVE STATEMENT: Patient reports that she has the B12 injection scheduled for 4 Thursdays in a row and then once a month for 6 month. She reports that she noticed she has  been falling to the left recently and the only new changes were the B12 injection and a recent increase in her seizure medication dosage.   PERTINENT HISTORY: NO BP OR IV IN LEFT UE d/t Lymphedema   PMHx includes Arthritis, Bipolar 1, Vulva and Breast Cancer (currently in remission), Depression, GAD, Graves Disease, Anemia, OCD, pre-diabetes, PTSD, restless leg syndrome, seizure, chronic migraine    PAIN:  Are you having pain? Yes, L shoulder pain 8/10  Pain best/worst: 8-10/10 Aggravating factors: sitting/standing unsupported Easing factors: having arm supported, medication patches  PRECAUTIONS: Other: Lymphedema Alert - NO BP on LEFT ARM    WEIGHT BEARING RESTRICTIONS: No  FALLS:  Has patient fallen in last 6 months?  "Not that I recall"  LIVING ENVIRONMENT: Lives with: lives with their family Lives in:  House/apartment Stairs: No Has following equipment at home: Walker - 4 wheeled, bed side commode, and Grab bars  OCCUPATION: CNA  PLOF: Independent  PATIENT GOALS: "I want to be able to go back to work."   NEXT MD VISIT: 04/26/23 Neurologist, 05/02/23 Orthopedic   OBJECTIVE: (objective measures completed at initial evaluation unless otherwise dated)   DIAGNOSTIC FINDINGS:   04/05/23: HIP UNILAT W or WO Pelvis 2-3 views IMPRESSION: Mild-to-moderate right and mild left femoroacetabular osteoarthritis.  PATIENT SURVEYS:  FOTO 39 current, 56 predicted   COGNITION: Overall cognitive status: Impaired - related to recent changes with memory as noted in subjective assessment      SENSATION: Not tested  POSTURE: rounded shoulders and posterior pelvic tilt   UPPER EXTREMITY ROM:  A/PROM Right 05/15/23 Left 05/15/23  Shoulder flexion 158 deg 122 deg *  Shoulder abduction    Shoulder internal rotation    Shoulder external rotation (functional) C6 C5  Elbow flexion WNL WNL  Elbow extension WNL WNL  Wrist flexion    Wrist extension     (Blank rows = not tested) (Key: WFL = within functional limits not formally assessed, * = concordant pain, s = stiffness/stretching sensation, NT = not tested)  Comments:    LOWER EXTREMITY MMT:  MMT Right eval Left eval  Hip flexion 3+ 3+  Hip extension 4- 4-  Hip abduction 4- 4-  Hip adduction    Hip internal rotation 3+ 4-  Hip external rotation 3+ 4-  Knee flexion 3+ 4-  Knee extension 3+ 3+  Ankle dorsiflexion 4+ 4+  Ankle plantarflexion    Ankle inversion    Ankle eversion     (Blank rows = not tested)  UPPER EXTREMITY MMT:  MMT Right 05/15/23 Left 05/15/23  Shoulder flexion 4 4 s  Shoulder extension    Shoulder abduction 4+ 4+  Shoulder extension    Shoulder internal rotation 4 4-  Shoulder external rotation 4 4  Elbow flexion    Elbow extension    Grip strength    (Blank rows = not tested)  (Key: WFL = within functional  limits not formally assessed, * = concordant pain, s = stiffness/stretching sensation, NT = not tested)  Comments:    FUNCTIONAL TESTS:  Timed up and go (TUG): TBD  Balance    Romberg EO on floor  30 sec, Min sway  Romberg EC on floor 30 sec, min sway  Romberg EO on airex 30 sec, mod sway  Romberg EC on airex  30 sec, mod sway     Balance Right Left   Tandem on floor 30 sec 30 sec  SLS on floor  11 sec 7 sec  GAIT: Distance walked: 50 feet Assistive device utilized: None Level of assistance: SBA Comments: slow gait speed - BIL small step length with decreased stance time    TODAY'S TREATMENT [facility] Adult PT Treatment:                                                DATE: 05/24/23 Therapeutic Exercise: Nustep level 4 x 6 mins (200 steps) LE only Red band row x10 cues for reduced elbow compensations Red band shoulder ext x10 cues for posture Red band ER/IR x10 each cues for setup  Standing heel raises x10 Standing toe raises x10 Eyes closed static stance 30sec CGA Eyes open/closed on airex static stance 2x30sec each CGA for safety, counter to assist with getting onto airex Standing march 108 BIL no UE support, CGA for safety and emphasis on slow pacing, upright posture Tandem stance on ground x30" BIL   [facility] Adult PT Treatment:                                                DATE: 05/22/23 Therapeutic Exercise: Red band row x10 cues for reduced elbow compensations Red band shoulder ext x10 cues for posture Red band ER/IR x10 each cues for setup  Eyes closed static stance 30sec CGA Eyes open/closed on airex static stance 2x30sec each CGA for safety, ski pole to assist with getting onto airex Standing march x8 BIL no UE support, CGA for safety and emphasis on slow pacing, upright posture    [facility] Adult PT Treatment:                                                DATE: 05/17/23 Therapeutic Exercise: Nustep level 4 x 5 mins (100 steps) LE only Rows RTB 2x10 Shoulder  extension RTB 2x10 ER/IR RTB Lt 2x10 ea Standing marches x8 BIL with counter support  Standing heel raises 2x10 with counter support Hip 45 deg kickback x10 BIL cues for alignment Shoulder rolls fwd x10 each cues for pacing and ROM Bridges 2x10 SLR x10 BIL Supine hip adduction ball squeeze 5" hold 2x10 Sidelying clamshell 2x10 BIL                                                                                                                    PATIENT EDUCATION:  Education details: rationale for interventions, PT exam findings and POC  Person educated: Patient Education method: Medical illustrator Education comprehension: verbalized understanding, returned demonstration, and needs further education  HOME EXERCISE PROGRAM: Access Code: [email or web address] URL: [email or web address] Date: 05/15/2023 Prepared by: Fransisco Hertz  Exercises - Sit  to Stand with Armchair  - 2-3 x daily - 7 x weekly - 1 sets - 5 reps - Heel Raises with Counter Support  - 2-3 x daily - 7 x weekly - 1 sets - 8 reps - Seated Scapular Retraction  - 2-3 x daily - 7 x weekly - 1 sets - 8 reps  ASSESSMENT:  CLINICAL IMPRESSION: Patient presents to PT reporting continued high levels of Lt shoulder pain and that her fatigue continues to be debilitating. She has had one VitaminB12 injection so far and has more scheduled, has not noticed an effect on her fatigue levels yet. Continued to focus on balance tasks and gentle strengthening with lower repetitions. Patient continues to benefit from skilled PT services and should be progressed as able to improve functional independence.    OBJECTIVE IMPAIRMENTS: Abnormal gait, decreased activity tolerance, decreased balance, decreased endurance, decreased mobility, difficulty walking, decreased strength, impaired perceived functional ability, improper body mechanics, postural dysfunction, and pain.   ACTIVITY LIMITATIONS: carrying, lifting, standing,  squatting, stairs, transfers, bed mobility, locomotion level, and caring for others  PARTICIPATION LIMITATIONS: cleaning, community activity, and occupation  PERSONAL FACTORS: Past/current experiences, Time since onset of injury/illness/exacerbation, and 3+ comorbidities: PMHx includes Arthritis, Bipolar 1, Vulva and Breast Cancer (currently in remission), Depression, GAD, Graves Disease, Anemia, OCD, pre-diabetes, PTSD, restless leg syndrome, seizure, chronic migraine   are also affecting patient's functional outcome.   REHAB POTENTIAL: Fair    CLINICAL DECISION MAKING: Evolving/moderate complexity  EVALUATION COMPLEXITY: Moderate   GOALS: Goals reviewed with patient? Yes  SHORT TERM GOALS: Target date: 05/16/2023   Patient will be independent with initial home program for LE strengthening and balance . Baseline: to be initiated at follow up visit  05/15/23: HEP established Goal status: ONGOING  2.  Patient will ambulate 6 minutes with LRAD without LOB.  Baseline: requires RW, unsteady with walking >50-124ft 05/15/23: ambulates without device within clinic, no instability or LOB noted Goal status: ONGOING   LONG TERM GOALS: Target date: 06/26/2023 (updated 05/15/23 given updated referral to include shoulder) Patient will report improved overall functional ability with FOTO score of 56 predicted.  Baseline: 39 current Goal status: INITIAL  2.  Patient will perform SLS at least 20 sec on each LE in order to maximize safe and independent function.  Baseline:  Balance Right Left   Tandem on floor 30 sec 30 sec  SLS on floor  11 sec 7 sec   Goal status: INITIAL  3.  Patient will demonstrate at least 4 or 4+/5 MMT with all LE strength testing.   Baseline:  MMT Right eval Left eval  Hip flexion 3+ 3+  Hip extension 4- 4-  Hip abduction 4- 4-  Hip adduction    Hip internal rotation 3+ 4-  Hip external rotation 3+ 4-  Knee flexion 3+ 4-  Knee extension 3+ 3+  Ankle  dorsiflexion 4+ 4+   Goal status: INITIAL  4.  Patient will demonstrate ability to walk 6+ minutes at normal gait speed without AD or LOB.  Baseline: requires RW  Goal status: INITIAL  5.  Patient will demonstrate ability to ambulate over varied surfaces, including inclines without LOB and no more than minimal fatigue after 15 minutes of weight bearing activities.  Baseline: unable  Goal status: INITIAL  6. Pt will demonstrate at least 145 deg L shoulder elevation AROM in order to facilitate improved tolerance to daily tasks.  Baseline: see ROM chart above  Goal status: NEW 05/15/23  7. Pt will report at least 50% decrease in overall pain levels in past week in order to facilitate improved tolerance to basic ADLs/mobility.   Baseline: 7-10/10  Goal status: NEW 05/15/23     PLAN: updated 05/15/23 to include shoulder POC  PT FREQUENCY: 1-2x/week    PT DURATION: 6 weeks  PLANNED INTERVENTIONS: Therapeutic exercises, Therapeutic activity, Neuromuscular re-education, Balance training, Gait training, Patient/Family education, Self Care, Joint mobilization, Electrical stimulation, Cryotherapy, Moist heat, Manual therapy, and Re-evaluation  PLAN FOR NEXT SESSION: balance (narrow BOS, SLS, uneven surfaces), gait activities encouraging increased SLS time, reactive balance, BIL LE strengthening. Postural mobility and GH mobility within pt tolerance, would likely benefit from gradual IR strengthening and scapular work. HEP update    Berta Minor PTA 05/24/2023 4:06 PM

## 2023-05-25 ENCOUNTER — Inpatient Hospital Stay: Payer: PRIVATE HEALTH INSURANCE

## 2023-05-25 ENCOUNTER — Ambulatory Visit: Payer: PRIVATE HEALTH INSURANCE

## 2023-05-25 DIAGNOSIS — C50212 Malignant neoplasm of upper-inner quadrant of left female breast: Secondary | ICD-10-CM | POA: Diagnosis not present

## 2023-05-25 DIAGNOSIS — T733XXA Exhaustion due to excessive exertion, initial encounter: Secondary | ICD-10-CM

## 2023-05-25 MED ORDER — CYANOCOBALAMIN 1000 MCG/ML IJ SOLN
1000.0000 ug | Freq: Once | INTRAMUSCULAR | Status: AC
  Administered 2023-05-25: 1000 ug via INTRAMUSCULAR
  Filled 2023-05-25: qty 1

## 2023-05-25 NOTE — Addendum Note (Signed)
Encounter addended by: Merideth Abbey on: 05/25/2023 11:20 AM  Actions taken: Imaging Exam ended, Charge Capture section accepted

## 2023-05-29 ENCOUNTER — Other Ambulatory Visit (HOSPITAL_COMMUNITY): Payer: Self-pay

## 2023-05-29 ENCOUNTER — Telehealth: Payer: Self-pay | Admitting: Neurology

## 2023-05-29 ENCOUNTER — Ambulatory Visit (INDEPENDENT_AMBULATORY_CARE_PROVIDER_SITE_OTHER): Payer: PRIVATE HEALTH INSURANCE | Admitting: Neurology

## 2023-05-29 ENCOUNTER — Encounter: Payer: Self-pay | Admitting: Neurology

## 2023-05-29 VITALS — BP 130/92 | HR 73 | Ht 67.0 in | Wt 178.0 lb

## 2023-05-29 DIAGNOSIS — F319 Bipolar disorder, unspecified: Secondary | ICD-10-CM

## 2023-05-29 DIAGNOSIS — F39 Unspecified mood [affective] disorder: Secondary | ICD-10-CM | POA: Diagnosis not present

## 2023-05-29 DIAGNOSIS — R569 Unspecified convulsions: Secondary | ICD-10-CM | POA: Diagnosis not present

## 2023-05-29 DIAGNOSIS — Z79899 Other long term (current) drug therapy: Secondary | ICD-10-CM | POA: Diagnosis not present

## 2023-05-29 MED ORDER — LAMOTRIGINE 25 MG PO TABS
ORAL_TABLET | ORAL | 4 refills | Status: DC
Start: 2023-05-29 — End: 2023-06-13
  Filled 2023-05-29: qty 240, 30d supply, fill #0

## 2023-05-29 NOTE — Progress Notes (Signed)
[facility]    Provider:  Dr [name] Requesting Provider: Marcine Matar, MD Primary Care Provider:  Marcine Matar, MD  CC:  headaches, recent provoked seizure  05/29/2023: She has been fatigues since being I the hospital. Magnesium was still low on Dr. Henriette Combs labs last month, she had repeat at mephrology. She has an appointment with Dr. Laural Benes (advised her I didn;t see a more recent serum magnesium discuss with Dr. Laural Benes). Fatigued. No seizures since she ws last seen. Was started on Lamictal for her mood and discussed with Dr. Lolly Mustache to keep her on Lamictal and titrate off of Keppra as lamictal is also a seizure medication. Dr. Georgiann Mohs gave her b12 injections, She also see cardiology next month. 2 AEDs may be causing fatigue. If we can get lamictal up to 100mg  bid we can start to decrease keppra. Next week she start taking 4 lamictal once a day. She is scared to decrease keppra due to her anxiety told her we could start decreasing keppra at this time while we titrate the lamictal but she is scared and I completely understand.   04/26/2023: Migraine patient who is here for follow up from hospital. has Herpes simplex virus (HSV) infection; Tobacco abuse; GERD (gastroesophageal reflux disease); Functional dyspepsia; Genital warts; Neck pain on left side; Loss of weight; Hidradenitis suppurativa of left axilla; Nephrolithiasis; Obstructive pyelonephritis; Normocytic anemia; Restless leg syndrome; History of ELISA positive for HSV; Nausea and vomiting; Seasonal allergic rhinitis; Essential hypertension; Vulvar ulceration; Vulvar cancer (HCC); Malignant neoplasm of upper-inner quadrant of left breast in female, estrogen receptor positive (HCC); Port-A-Cath in place; Colitis; Hypokalemia; Dehydration; Mutation in BRIP1 gene; Hyperlipidemia; Prediabetes; Hypomagnesemia; Bipolar 1 disorder (HCC); Obesity (BMI 30.0-34.9); Hyperthyroidism; Pain in left knee; Sebaceous cyst; Chronic  migraine without aura without status migrainosus, not intractable; Pain in right knee; Bilateral primary osteoarthritis of knee; Migraine with aura and without status migrainosus, not intractable; Provoked seizures (HCC); Respiratory depression; Status epilepticus (HCC); Prolonged QT interval; Graves disease; Dizziness; Fatigue; and Mood disorder (HCC) on their problem list.  She has a hx of bipolar 1, cancer s/p chemo, radiation therapy.  Tobacco abuse, depression, anxiety, migraines, panic attacks, PTSD, obesity, gastric ulcer, Graves' disease, nephrolithiasis, hypertension, prediabetes was seen in the hospital for possible seizures.  I reviewed discharge summaries from Dr. Randol Kern and Dr. Dayna Barker: She was found minimally responsive on the floor by her daughter on March 31, 2019 4 in the morning, patient had had intermittent nausea and vomiting but worse over the last week with poor p.o. intake, and worsening of her chronic headaches migraines.  No previous history of seizures, witnessed seizures and 2 additional seizures with EMS.  In the ED had rightward gaze and remain minimally responsible with an additional 2 seizures in the ER.  She was loaded with Keppra and treated with Ativan labs were significant for very elevated white blood cells 25.8, potassium 3.4, magnesium 0.8, Phos 5.4, calcium 10.6, glucose 316, she was admitted and intubated.  MRI was negative for any acute processes, negative CSF, overnight EEG showed mild to moderate diffuse encephalopathy no seizure or epileptiform discharges seen in long-term EEG was discontinued on July 27.  Diagnosed with seizure versus syncopal seizure from hypotension from dehydration from frequent nausea, vomiting and poor oral air intake and abnormalities of electrolytes with extremely elevated white blood cell count, hypomagnesemia and hypokalemia.  Possibly provoked seizure.  She was seen again several days later 04/10/2023 in the hospital she had a prolonged QT  interval, likely due to severe electrolyte abnormality and her Zyprexa was discontinued.  She had profound hypomagnesemia and hypokalemia.  Her thyroid was normal.  Her migraines were stable.  Hospitalization was for questionable seizure versus syncope, she was admitted and seen by neurology, EEG was inconclusive, MRI of the brain was nonacute, was placed on Keppra 750 mg p.o. twice daily.    I reviewed Dr. Henriette Combs recent notes, patient with recurrent vomiting, going on for months, smoking marijuana 3 to 4 months ago, no new meds that she knew that could be causing it, has follow-up with GI 06/02/2023.  She was also referred to nephrology.  She also sees Dr. Lolly Mustache and psychiatry.  She still has electrolyte abnormalities as of 818 and her magnesium came back low at 0.6 despite taking mag oxide.  Dr. Laural Benes suspected it was high doses of omeprazole.  She is here with sister. The Ajovy was not approved. She has not taken Ajovy, we will hold off on Ajovy. And the Ubrelvy. It has been months since she took the Ajovy. No seizures since July. Don't think she should drive until she has been stabilized medically, likely provoked seizure due to electrolyte imbalances, dehydration, vomiting, nausea, diarrhead. Continue the Keppra and no driving but once medical problems are stabilized we can order an eeg in the office and it negative I think she could drive again and keep her on keppra for 6 months. 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 When she had chemo she was able to work. She was started on lamictal and I will contact Dr. Lolly Mustache to ask if we can titrate to 200 bid and then decrease and titrate off the keppra. I warned her about the keppra and causing irritability. Her sister is very concerned about her mental health. She is taking magnesium and vitamin D and following closely with pcp, referrals to gastro and nephro. Lamictal and keppra and magnesium are used for migraines, at  this time will hold off on anything and monitor migraines.  Patient complains of symptoms per HPI as well as the following symptoms: seizure . Pertinent negatives and positives per HPI. All others negative   10/03/2022: Patient was started on Ajovy. MRi showed partially empty sella which could have been incidental, no papilledema seen on exam. Here for follow up. [name] has worked tremendously well, 100%, last few been sick and she has had some migraines but otherwise the Ajovy has been tremendous now she has flu-like symptoms. Likely illness that's causing the headaches. Ajovy has helped tremendously. She has ubrelvy as needed. We will prescribe and follow up in one year. No new brain imaging in epic review.  Patient complains of symptoms per HPI as well as the following symptoms: flu . Pertinent negatives and positives per HPI. All others negative  Reviewed labs:     Latest Ref Rng & Units 04/21/2023    2:00 PM 04/14/2023   12:07 PM 04/12/2023   12:46 AM  CMP  Glucose 70 - 99 mg/dL  90  161   BUN 6 - 24 mg/dL  7  6   Creatinine 0.96 - 1.00 mg/dL  0.45  4.09   Sodium 811 - 144 mmol/L  140  136   Potassium 3.5 - 5.2 mmol/L  4.4  3.4   Chloride 96 - 106 mmol/L  100  101   CO2 20 - 29 mmol/L  19  22   Calcium 8.7 - 10.2  mg/dL 16.1  09.6  9.2   Total Protein 6.5 - 8.1 g/dL   6.8   Total Bilirubin 0.3 - 1.2 mg/dL   0.9   Alkaline Phos 38 - 126 U/L   45   AST 15 - 41 U/L   28   ALT 0 - 44 U/L   39       Latest Ref Rng & Units 04/12/2023   12:46 AM 04/11/2023    7:15 AM 04/10/2023    9:40 AM  CBC  WBC 4.0 - 10.5 K/uL 9.8  8.9  13.9   Hemoglobin 12.0 - 15.0 g/dL 04.5  40.9  81.1   Hematocrit 36.0 - 46.0 % 38.0  39.9  37.4   Platelets 150 - 400 K/uL 421  381  352       HPI:  Sandra Miller is a 47 y.o. female here as requested by Marcine Matar, MD for headaches. She has had a headache for a week ago. Migraines started years ago. PMHx bipolar, cancer, depression, gad, graves, kidney  stones, htn, panic attacks, radiation therapy, ocd, pre-diates, ocd, PTSD, RLS, recovering alcoholic, breast and vulvar cancer, Pulsating/pounding/throbbing,photo/phonophobia, nausea, hurts to move, nausea, sister has migraines and daughter. Whole head, no hearing changes, sleep helps, can radiate to the back of the head. Vision is blurry all the time. She doesn't see a difference with glasses. Daily headaches. At least 10 migraine days a month that can last 24 hours and be moderate to severe although has had 4 days of migraines in a row. No aura. No other focal neurologic deficits, associated symptoms, inciting events or modifiable factors.   Reviewed notes, labs and imaging from outside physicians, which showed:  06/28/2022 tsh and ft4 nml  From a thorough review of records meds tried: Tylenol, baclofen,voltaren, benadryl, prozac, gabapentin, ibupren, ketoralc, labetalol, lisinopril, magnesium, methimazole,robaxin, reglan, medrol, zyprexa, prednisone, compazine, propranolol,has been on topamx also  topiramate contraindicated due to kidney stones, trazodone, effexor,amitriptyline and nortrityline, imitrex and maxalt(palpitations), aimovig contraindicated due to constipation, Ajovy(worked but insurance problems), ubrelvy  TECHNIQUE: Multidetector CT imaging of the head was performed using the standard protocol during bolus administration of intravenous contrast. Multiplanar CT image reconstructions and MIPs were obtained to evaluate the vascular anatomy.   RADIATION DOSE REDUCTION: This exam was performed according to the departmental dose-optimization program which includes automated exposure control, adjustment of the mA and/or kV according to patient size and/or use of iterative reconstruction technique.   CONTRAST:  75mL OMNIPAQUE IOHEXOL 350 MG/ML SOLN   COMPARISON:  None Available.   FINDINGS: CT HEAD   Brain: Cerebral volume within normal limits for patient age.   No evidence for  acute intracranial hemorrhage. No findings to suggest acute large vessel territory infarct. No mass lesion, midline shift, or mass effect. Ventricles are normal in size without evidence for hydrocephalus. No extra-axial fluid collection identified. Partially empty sella noted.   Vascular: No hyperdense vessel identified.   Skull: Scalp soft tissues demonstrate no acute abnormality. Calvarium intact.   Sinuses/Orbits: Globes and orbital soft tissues within normal limits.   Minimal ethmoidal sinus disease. Paranasal sinuses are otherwise clear. No mastoid effusion.   CTA HEAD   Anterior circulation: Both internal carotid arteries widely patent to the termini without stenosis. A1 segments widely patent. Normal anterior communicating artery complex. Both anterior cerebral arteries widely patent to their distal aspects without stenosis. No M1 stenosis or occlusion. Normal MCA bifurcations. Distal MCA branches well perfused and symmetric.  Posterior circulation: Both V4 segments patent to the vertebrobasilar junction without stenosis. Both PICA origins patent and normal. Basilar widely patent to its distal aspect without stenosis. Superior cerebellar arteries patent bilaterally. Both PCAs primarily supplied via the basilar and are well perfused to there distal aspects.   Venous sinuses: Patent allowing for timing the contrast bolus.   Anatomic variants: None significant.  No intracranial aneurysm.   Review of the MIP images confirms the above findings.   CT VENOGRAM   Normal enhancement seen throughout the superior sagittal sinus to the torcula. Transverse and sigmoid sinuses are patent as are the jugular bulbs and visualized proximal internal jugular veins. Right transverse sinus dominant. Straight sinus, vein of Galen, and internal cerebral veins are patent. No visible cortical vein thrombosis. No dural sinus thrombosis.   IMPRESSION: 1. Normal CTA of the head. No large  vessel occlusion, hemodynamically significant stenosis, or other acute vascular abnormality. No aneurysm. 2. Negative CT venogram.  Evidence for dural sinus thrombosis. 3. No other acute intracranial abnormality. 4. Partially empty sella. Finding is nonspecific, but can be seen in the setting of idiopathic intracranial hypertension.  Review of Systems: Patient complains of symptoms per HPI as well as the following symptoms migraines. Pertinent negatives and positives per HPI. All others negative.   Social History   Socioeconomic History   Marital status: Single    Spouse name: Not on file   Number of children: Not on file   Years of education: Not on file   Highest education level: Not on file  Occupational History   Not on file  Tobacco Use   Smoking status: Former    Current packs/day: 0.50    Types: Cigarettes   Smokeless tobacco: Never   Tobacco comments:    Recently started a smoking cessation class. Quit 03/31/23  Vaping Use   Vaping status: Never Used  Substance and Sexual Activity   Alcohol use: Not Currently   Drug use: Not Currently    Types: Marijuana    Comment: none in about 1 year   Sexual activity: Yes    Partners: Male    Birth control/protection: Surgical  Other Topics Concern   Not on file  Social History Narrative   Former [facility] patient.      Was on disability at one point.   Return to the workforce.  40 hours a week at [employer], 10 hours a week on the weekends at [employer] [employer] in [place], [employer] at night 10 to 12 hours a week.      Has grown children, she lives alone with a pet, continues to smoke no alcohol or drug use at this time      History of EtOH abuse and THC use.         Social Determinants of Health   Financial Resource Strain: Medium Risk (01/24/2022)   Received from [facility], [facility]   Overall Financial Resource Strain (CARDIA)    Difficulty of Paying Living Expenses: Somewhat hard  Food Insecurity:  No Food Insecurity (04/10/2023)   Hunger Vital Sign    Worried About Running Out of Food in the Last Year: Never true    Ran Out of Food in the Last Year: Never true  Transportation Needs: No Transportation Needs (04/10/2023)   PRAPARE - Administrator, Civil Service (Medical): No    Lack of Transportation (Non-Medical): No  Physical Activity: Inactive (01/24/2022)   Received [facility], [facility]  Exercise Vital Sign    Days of Exercise per Week: 0 days    Minutes of Exercise per Session: 0 min  Stress: Stress Concern Present (01/24/2022)   Received from [facility], [facility] of Occupational [facility] - Occupational Stress Questionnaire    Feeling of Stress : Very much  Social Connections: Unknown (02/12/2023)   Received from [facility], [facility]   Social Network    Social Network: Not on file  Intimate Partner Violence: Not At Risk (04/10/2023)   Humiliation, Afraid, Rape, and Kick questionnaire    Fear of Current or Ex-Partner: No    Emotionally Abused: No    Physically Abused: No    Sexually Abused: No    Family History  Problem Relation Age of Onset   Heart disease Father    Lung cancer Father        d. 60   Alcohol abuse Father    Heart disease Mother    Depression Mother    Anxiety disorder Mother    Drug abuse Brother    Alcohol abuse Brother    Drug abuse Brother    ADD / ADHD Brother    Colon polyps Brother    Cancer Paternal Grandfather        "stomach"   Diabetes Maternal Grandfather    Diabetes Paternal Grandmother    Kidney disease Maternal Uncle    Cirrhosis Cousin        alcoholic   Anxiety disorder Maternal Aunt    Depression Maternal Aunt    Cancer Cousin        maternal; ovarian cancer or other "female" cancer?   Lung cancer Paternal Uncle 54   Throat cancer Cousin        paternal; dx 40s   Lung cancer Cousin        paternal; dx 56s    Past Medical History:  Diagnosis Date   Arthritis     Bipolar 1 disorder (HCC)    Cancer (HCC)    vulva, and breast   Complication of anesthesia    wakes up during procedures   Depression    GAD (generalized anxiety disorder)    Genital HSV    currently per pt  no break out 03-22-2016    GERD (gastroesophageal reflux disease)    Graves disease    Hiatal hernia    History of cervical dysplasia    2012 laser ablation   History of esophageal dilatation    for dysphasia -- x2 dilated   History of gastric ulcer    History of Helicobacter pylori infection    remote hx   History of hidradenitis suppurativa    "gets all over body intermittantly"     History of hypertension    no issue since stopped drinking alcohol 2014   History of kidney stones    History of panic attacks    History of radiation therapy 03/16/2020-05/08/2020   vulva  Dr Antony Blackbird   History of radiation therapy 09/09/2020-10/23/2020   left chest wall/left SCV   Dr Antony Blackbird   Hypertension    Iron deficiency anemia    Left ureteral stone    OCD (obsessive compulsive disorder)    PONV (postoperative nausea and vomiting)    Pre-diabetes    PTSD (post-traumatic stress disorder)    Recovering alcoholic in remission (HCC)    since 2014   RLS (restless legs syndrome)    Smokers' cough (HCC)  Thyroid disease    Urgency of urination    Yeast infection involving the vagina and surrounding area    secondary to taking antibiotic    Patient Active Problem List   Diagnosis Date Noted   Mood disorder (HCC) 05/29/2023   Fatigue 05/15/2023   Prolonged QT interval 04/10/2023   Dizziness 04/10/2023   Provoked seizures (HCC) 03/31/2023   Respiratory depression 03/31/2023   Status epilepticus (HCC) 03/31/2023   Migraine with aura and without status migrainosus, not intractable 10/03/2022   Pain in right knee 09/07/2022   Bilateral primary osteoarthritis of knee 09/07/2022   Chronic migraine without aura without status migrainosus, not intractable 07/07/2022   Graves  disease 05/10/2022   Sebaceous cyst 12/06/2021   Pain in left knee 11/03/2021   Hyperthyroidism 08/13/2021   Bipolar 1 disorder (HCC) 05/07/2021   Obesity (BMI 30.0-34.9) 05/07/2021   Hyperlipidemia 08/12/2020   Prediabetes 08/12/2020   Hypomagnesemia 08/12/2020   Mutation in BRIP1 gene 06/24/2020   Hypokalemia 06/17/2020   Dehydration    Colitis 06/16/2020   Port-A-Cath in place 03/11/2020   Malignant neoplasm of upper-inner quadrant of left breast in female, estrogen receptor positive (HCC) 02/25/2020   Vulvar cancer (HCC) 01/29/2020   Vulvar ulceration 01/23/2020   Essential hypertension 10/31/2019   Nausea and vomiting 12/27/2018   Seasonal allergic rhinitis 12/27/2018   History of ELISA positive for HSV 10/01/2018   Restless leg syndrome 04/01/2016   Nephrolithiasis 03/08/2016   Obstructive pyelonephritis 03/08/2016   Normocytic anemia 03/08/2016   Hidradenitis suppurativa of left axilla 11/03/2015   Loss of weight 09/08/2015   Neck pain on left side 10/22/2014   Genital warts 05/20/2014   GERD (gastroesophageal reflux disease) 12/26/2012   Functional dyspepsia 12/26/2012   Tobacco abuse 08/22/2012   Herpes simplex virus (HSV) infection 02/03/2007    Past Surgical History:  Procedure Laterality Date   CESAREAN SECTION  1995   w/  Bilateral Tubal Ligation   COLONOSCOPY  last one 08-09-2013   CYSTOSCOPY W/ URETERAL STENT PLACEMENT Left 03/29/2016   Procedure: CYSTOSCOPY WITH STENT REPLACEMENT;  Surgeon: Hildred Laser, MD;  Location: [facility];  Service: Urology;  Laterality: Left;   CYSTOSCOPY WITH RETROGRADE PYELOGRAM, URETEROSCOPY AND STENT PLACEMENT Left 03/08/2016   Procedure: CYSTOSCOPY WITH  LEFT RETROGRADE PYELOGRAM, AND STENT PLACEMENT;  Surgeon: Hildred Laser, MD;  Location: [facility];  Service: Urology;  Laterality: Left;   CYSTOSCOPY/RETROGRADE/URETEROSCOPY/STONE EXTRACTION WITH BASKET Left 03/29/2016   Procedure:  CYSTOSCOPY/RETROGRADE/URETEROSCOPY/STONE EXTRACTION WITH BASKET;  Surgeon: Hildred Laser, MD;  Location: [facility];  Service: Urology;  Laterality: Left;   ENDOMETRIAL ABLATION W/ NOVASURE  04-01-2010   ESOPHAGOGASTRODUODENOSCOPY  last one 08-09-2013   KNEE ARTHROSCOPY Left as teen   LASER ABLATION OF THE CERVIX  2012 approx   MASTECTOMY WITH AXILLARY LYMPH NODE DISSECTION Left 07/27/2020   Procedure: LEFT MASTECTOMY WITH LEFT RADIOACTIVE SEED GUIDED TARGETED AXILLARY LYMPH NODE DISSECTION;  Surgeon: Ovidio Kin, MD;  Location: [facility];  Service: General;  Laterality: Left;   PORT-A-CATH REMOVAL Right 05/18/2021   Procedure: REMOVAL PORT-A-CATH;  Surgeon: Manus Rudd, MD;  Location: [facility];  Service: General;  Laterality: Right;   PORTACATH PLACEMENT Right 03/06/2020   Procedure: INSERTION PORT-A-CATH WITH ULTRASOUND GUIDANCE;  Surgeon: Ovidio Kin, MD;  Location: [facility];  Service: General;  Laterality: Right;   ROBOTIC ASSISTED TOTAL HYSTERECTOMY WITH BILATERAL SALPINGO OOPHERECTOMY N/A 05/18/2021   Procedure: XI ROBOTIC ASSISTED TOTAL HYSTERECTOMY WITH  BILATERAL SALPINGO OOPHORECTOMY;  Surgeon: Adolphus Birchwood, MD;  Location: [facility];  Service: Gynecology;  Laterality: N/A;   TRANSTHORACIC ECHOCARDIOGRAM  05-19-2006   lvsf normal, ef 55-65%, there was mild flattening of the interventricular septum during diastoli/  RV size at upper limits normal   TUBAL LIGATION     VULVA /PERINEUM BIOPSY N/A 05/18/2021   Procedure: VULVAR BIOPSY;  Surgeon: Adolphus Birchwood, MD;  Location: [facility];  Service: Gynecology;  Laterality: N/A;   WISDOM TOOTH EXTRACTION  age 43 's    Current Outpatient Medications  Medication Sig Dispense Refill   diclofenac Sodium (VOLTAREN) 1 % GEL Apply 4 grams topically 4  times daily as needed. (Patient taking differently: Apply 4 g topically 4 (four) times daily as needed (pain).) 500 g 6   FLUoxetine (PROZAC) 10 MG capsule Take 3 capsules (30  mg total) by mouth daily. 90 capsule 1   gabapentin (NEURONTIN) 300 MG capsule Take 1 capsule (300 mg) by mouth daily. 30 capsule 1   lamoTRIgine (LAMICTAL) 25 MG tablet Take 3 tablets (75 mg total) by mouth daily for two weeks and then take 4 tablets by mouth daily; Then take 25mg  in the morning and 100mg  at bedtime 1 week start October 4th, then 50mg  in the morning and 100mg  at bedtime 1week, then 75mg  in the mornig and 100mg  at bedtime 1 week, then 100mg  in the morning and 100mg  at bedtime. 240 tablet 4   letrozole (FEMARA) 2.5 MG tablet Take 1 tablet by mouth daily. 90 tablet 3   levETIRAcetam (KEPPRA) 750 MG tablet Take 1 tablet (750 mg total) by mouth 2 (two) times daily. 60 tablet 0   levETIRAcetam (KEPPRA) 750 MG tablet Take 1 tablet (750 mg total) by mouth 2 (two) times daily. 180 tablet 3   lisinopril (ZESTRIL) 10 MG tablet Take 1 tablet (10 mg total) by mouth daily. Stop the Lisinopril/HCTZ combination 90 tablet 1   loratadine (CLARITIN REDITABS) 10 MG dissolvable tablet Take 10 mg by mouth daily. OTC     LORazepam (ATIVAN) 1 MG tablet Take 1 tablet (1 mg total) by mouth 3 (three) times daily as needed for anxiety 90 tablet 1   Magnesium 400 MG TABS Take 800 mg by mouth 2 (two) times daily. 120 tablet 1   methimazole (TAPAZOLE) 5 MG tablet Take one tablet (5 mg dose) by mouth daily. (Patient taking differently: Take 5 mg by mouth daily.) 90 tablet 3   Multiple Vitamin (MULTIVITAMIN WITH MINERALS) TABS tablet Take 1 tablet by mouth daily.     Multiple Vitamins-Minerals (CERTA-VITE PO) Take by mouth daily. OTC     omeprazole (PRILOSEC) 40 MG capsule Take 1 capsule (40 mg) by mouth in the morning and at bedtime. 60 capsule 1   potassium chloride SA (KLOR-CON M) 20 MEQ tablet Take 1 tablet (20 mEq) by mouth daily. 90 tablet 1   propranolol (INDERAL) 10 MG tablet Take 1 tablet by mouth 2 times daily. 180 tablet 0   spironolactone (ALDACTONE) 25 MG tablet Take 0.5 tablets (12.5 mg total) by  mouth daily. 60 tablet 1   No current facility-administered medications for this visit.    Allergies as of 05/29/2023 - Review Complete 05/29/2023  Allergen Reaction Noted   Dilaudid [hydromorphone hcl] Other (See Comments) 06/18/2020   Aspirin Hives    Depakote [divalproex sodium] Nausea And Vomiting 11/01/2016   Minocycline Hives 09/23/2011    Vitals: BP (!) 130/92 (BP Location: Right Arm, Patient Position: Sitting)  Pulse 73   Ht 5\' 7"  (1.702 m) Comment: pt reported  Wt 178 lb (80.7 kg) Comment: pt reported  BMI 27.88 kg/m  Last Weight:  Wt Readings from Last 1 Encounters:  05/29/23 178 lb (80.7 kg)   Last Height:   Ht Readings from Last 1 Encounters:  05/29/23 5\' 7"  (1.702 m)  Physical exam: Exam: Gen: NAD, conversant, well nourised, obese, well groomed                     CV: RRR, no MRG. No Carotid Bruits. No peripheral edema, warm, nontender Eyes: Conjunctivae clear without exudates or hemorrhage  Neuro: Detailed Neurologic Exam  Speech:    Speech is normal; fluent and spontaneous with normal comprehension.  Cognition:    The patient is oriented to person, place, and time;     recent and remote memory intact;     language fluent;     normal attention, concentration,     fund of knowledge Exam: NAD, pleasant                  Speech:    Speech is normal; fluent and spontaneous with normal comprehension.  Cognition:    The patient is oriented to person, place, and time;     recent and remote memory intact;     language fluent;    Cranial Nerves:    The pupils are equal, round, and reactive to light.Trigeminal sensation is intact and the muscles of mastication are normal. The face is symmetric. The palate elevates in the midline. Hearing intact. Voice is normal. Shoulder shrug is normal. The tongue has normal motion without fasciculations.   Coordination:  No dysmetria  Motor Observation:    No asymmetry, no atrophy, and no involuntary movements  noted. Tone:    Normal muscle tone.     Strength:    Strength is V/V in the upper and lower limbs.      Sensation: intact to LT            Assessment/Plan:  Patient with likely provoked seizure due to electrolyte imbalances, dehydration, vomiting, nausea, diarrhea. Continue the Keppra 6 months(or maybe switch as Dr Lolly Mustache placed her on Lamictal) and no driving 6 months but once medical problems are stabilized we can order an eeg in the office and it negative I think she may be able to drive again, will have to evaluate then for now no driving 6 months and keep her on keppra for 6 months.  2 AEDs may be causing fatigue. If we can get lamictal up to 100mg  bid we can start to decrease keppra. Next week she start taking 4 lamictal once a day. She is scared to decrease keppra due to her anxiety told her we could start decreasing keppra at this time while we titrate the lamictal but she is scared and I completely understand.  Increase the Lamictal to 100mg  twice daily. Once you get to 100mg  daily the next week can starting adding 25mg  in the morning 1 week, then 50mg  for one week then 75 mg for one week then 100mg  at that time then will be at 100mg  twice daily. Then can decrease keppra to one tab then in 2 weeks can stop the keppra and continue lamictal 100mg  twice daily and come in for another lamictal level the first week of November in the morning before your first dose(bring your lamictal with you and take right after bloodwork).  Lamotrigine Tablets: 25mg  in  the morning and 100mg  at bedtime 1 week start October 4th 50mg  in the morning and 100mg  at bedtime 1week 75mg  in the mornig and 100mg  at bedtime 1 week 100mg  in the morning and 100mg  at bedtime 1 week Then switch to 100mg  tablet 1 in the morning and one at night   Continue Keppra for 6 months. I do think these were provoked due to medical conditions. When all electrolyte/medical imbalances are stabilized we can perform another eeg and  then discuss slowly decreasing keppra  Per [facility] statutes, patients with seizures are not allowed to drive until they have been seizure-free for six months. Discussed seizure safety. Sister provided much information  She is here with sister. It has been months since she took the Ajovy. No seizures since July.  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 When she had chemo she was able to work. She was started on lamictal and I will contact Dr. Lolly Mustache to ask if we can titrate to 200 bid and then decrease and titrate off the keppra. I warned her about the keppra and causing irritability. Her sister is very concerned about her mental health. She is taking magnesium and vitamin D and following closely with pcp, referrals to gastro and nephro. Lamictal and keppra and magnesium are used for migraines, at this time will hold off on migraine treatment and monitor migraines.  Sent Dr. Lolly Mustache a message, if he can titrate her to lamictal 100mg  bid I can take her off of the keppra. Will await response.  Was doing well on Ajovy and ubrelvy. Not on it now. MRi showed partially empty sella which could have been incidental, no papilledema seen on exam.    Orders Placed This Encounter  Procedures   Lamotrigine level   Levetiracetam level   Lamotrigine level   Meds ordered this encounter  Medications   lamoTRIgine (LAMICTAL) 25 MG tablet    Sig: Take 3 tablets (75 mg total) by mouth daily for two weeks and then take 4 tablets by mouth daily; Then take 25mg  in the morning and 100mg  at bedtime 1 week start October 4th, then 50mg  in the morning and 100mg  at bedtime 1week, then 75mg  in the mornig and 100mg  at bedtime 1 week, then 100mg  in the morning and 100mg  at bedtime.    Dispense:  240 tablet    Refill:  4    Cc: Marcine Matar, MD,  Marcine Matar, MD  Naomie Dean, MD  [facility] [address] [place], [place]  [address]  Phone [phone] Fax [phone]  I spent over 30 minutes of face-to-face and non-face-to-face time with patient on the  1. Seizures (HCC)   2. Long term use of drug   3. Mood disorder (HCC)   4. Bipolar 1 disorder (HCC)    diagnosis.  This included previsit chart review, lab review, study review, order entry, electronic health record documentation, patient education on the different diagnostic and therapeutic options, counseling and coordination of care, risks and benefits of management, compliance, or risk factor reduction   I spent over 45 minutes of face-to-face and non-face-to-face time with patient on the  1. Seizures (HCC)   2. Long term use of drug   3. Mood disorder (HCC)   4. Bipolar 1 disorder (HCC)     diagnosis.  This included previsit chart review, lab review, study review, order entry, electronic health record documentation, patient education  on the different diagnostic and therapeutic options, counseling and coordination of care, risks and benefits of management, compliance, or risk factor reduction

## 2023-05-29 NOTE — Telephone Encounter (Signed)
[facility]) Verify directions for lamoTRIgine (LAMICTAL) 25 MG tablet. Would like a call back.

## 2023-05-29 NOTE — Patient Instructions (Addendum)
Increase the Lamictal to 100mg  twice daily. Once you get to 100mg  daily the next week can starting adding 25mg  in the morning 1 week, then 50mg  for one week then 75 mg for one week then 100mg  at that time then will be at 100mg  twice daily. Then can decrease keppra to one tab then in 2 weeks can stop the keppra and continue lamictal 100mg  twice daily and come in for another lamictal level the first week of November in the morning before your first dose(bring your lamictal with you and take right after bloodwork).   25mg  in the morning and 100mg  at bedtime 1 week start October 4th 50mg  in the morning and 100mg  at bedtime 1week 75mg  in the mornig and 100mg  at bedtime 1 week 100mg  in the morning and 100mg  at bedtime 1 week  Lamotrigine Tablets What is this medication? LAMOTRIGINE (la MOE Patrecia Pace) prevents and controls seizures in people with epilepsy. It may also be used to treat bipolar disorder. It works by calming overactive nerves in your body. This medicine may be used for other purposes; ask your health care provider or pharmacist if you have questions. COMMON BRAND NAME(S): Lamictal, Subvenite What should I tell my care team before I take this medication? They need to know if you have any of these conditions: Heart disease History of irregular heartbeat Immune system problems Kidney disease Liver disease Low levels of folic acid in the blood Lupus Mental health condition Suicidal thoughts, plans, or attempt by you or a family member An unusual or allergic reaction to lamotrigine, other medications, foods, dyes, or preservatives Pregnant or trying to get pregnant Breastfeeding How should I use this medication? Take this medication by mouth with a glass of water. Follow the directions on the prescription label. Do not chew these tablets. If this medication upsets your stomach, take it with food or milk. Take your doses at regular intervals. Do not take your medication more often than  directed. A special MedGuide will be given to you by the pharmacist with each new prescription and refill. Be sure to read this information carefully each time. Talk to your care team about the use of this medication in children. While this medication may be prescribed for children as young as 2 years for selected conditions, precautions do apply. Overdosage: If you think you have taken too much of this medicine contact a poison control center or emergency room at once. NOTE: This medicine is only for you. Do not share this medicine with others. What if I miss a dose? If you miss a dose, take it as soon as you can. If it is almost time for your next dose, take only that dose. Do not take double or extra doses. What may interact with this medication? Atazanavir Certain medications for irregular heartbeat Certain medications for seizures, such as carbamazepine, phenobarbital, phenytoin, primidone, or valproic acid Estrogen or progestin hormones Lopinavir Rifampin Ritonavir This list may not describe all possible interactions. Give your health care provider a list of all the medicines, herbs, non-prescription drugs, or dietary supplements you use. Also tell them if you smoke, drink alcohol, or use illegal drugs. Some items may interact with your medicine. What should I watch for while using this medication? Visit your care team for regular checks on your progress. If you take this medication for seizures, wear a Medic Alert bracelet or necklace. Carry an identification card with information about your condition, medications, and care team. It is important to take this  medication exactly as directed. When first starting treatment, your dose will need to be adjusted slowly. It may take weeks or months before your dose is stable. You should contact your care team if your seizures get worse or if you have any new types of seizures. Do not stop taking this medication unless instructed by your care team.  Stopping your medication suddenly can increase your seizures or their severity. This medication may cause serious skin reactions. They can happen weeks to months after starting the medication. Contact your care team right away if you notice fevers or flu-like symptoms with a rash. The rash may be red or purple and then turn into blisters or peeling of the skin. You may also notice a red rash with swelling of the face, lips, or lymph nodes in your neck or under your arms. This medication may affect your coordination, reaction time, or judgment. Do not drive or operate machinery until you know how this medication affects you. Sit up or stand slowly to reduce the risk of dizzy or fainting spells. Drinking alcohol with this medication can increase the risk of these side effects. If you are taking this medication for bipolar disorder, it is important to report any changes in your mood to your care team. If your condition gets worse, you get mentally depressed, feel very hyperactive or manic, have difficulty sleeping, or have thoughts of hurting yourself or committing suicide, you need to get help from your care team right away. If you are a caregiver for someone taking this medication for bipolar disorder, you should also report these behavioral changes right away. The use of this medication may increase the chance of suicidal thoughts or actions. Pay special attention to how you are responding while on this medication. Your mouth may get dry. Chewing sugarless gum or sucking hard candy and drinking plenty of water may help. Contact your care team if the problem does not go away or is severe. If you become pregnant while using this medication, you may enroll in the [place] American Antiepileptic Drug Pregnancy Registry by calling [phone]. This registry collects information about the safety of antiepileptic medication use during pregnancy. This medication may cause a decrease in folic acid. You should make sure  that you get enough folic acid while you are taking this medication. Discuss the foods you eat and the vitamins you take with your care team. What side effects may I notice from receiving this medication? Side effects that you should report to your care team as soon as possible: Allergic reactions--skin rash, itching, hives, swelling of the face, lips, tongue, or throat Change in vision Fever, neck pain or stiffness, sensitivity to light, headache, nausea, vomiting, confusion Heart rhythm changes--fast or irregular heartbeat, dizziness, feeling faint or lightheaded, chest pain, trouble breathing Infection--fever, chills, cough, or sore throat Liver injury--right upper belly pain, loss of appetite, nausea, light-colored stool, dark yellow or brown urine, yellowing skin or eyes, unusual weakness or fatigue Low red blood cell count--unusual weakness or fatigue, dizziness, headache, trouble breathing Rash, fever, and swollen lymph nodes Redness, blistering, peeling or loosening of the skin, including inside the mouth Thoughts of suicide or self-harm, worsening mood, or feelings of depression Unusual bruising or bleeding Side effects that usually do not require medical attention (report to your care team if they continue or are bothersome): Diarrhea Dizziness Drowsiness Headache Nausea Stomach pain Tremors or shaking This list may not describe all possible side effects. Call your doctor for medical advice about side  effects. You may report side effects to FDA at 1-800-FDA-1088. Where should I keep my medication? Keep out of the reach of children and pets. Store at [employer] [facility] (77 degrees F). Protect from light. Get rid of any unused medication after the expiration date. To get rid of medications that are no longer needed or have expired: Take the medication to a medication take-back program. Check with your pharmacy or law enforcement to find a location. If you cannot return the medication,  check the label or package insert to see if the medication should be thrown out in the garbage or flushed down the toilet. If you are not sure, ask your care team. If it is safe to put it in the trash, empty the medication out of the container. Mix the medication with cat litter, dirt, coffee grounds, or other unwanted substance. Seal the mixture in a bag or container. Put it in the trash. NOTE: This sheet is a summary. It may not cover all possible information. If you have questions about this medicine, talk to your doctor, pharmacist, or health care provider.  2024 Elsevier/Gold Standard (2022-03-01 00:00:00)

## 2023-05-29 NOTE — Telephone Encounter (Signed)
I spoke with Sandra Miller at the pharmacy and clarified per Dr [name] that patient is already on at bedtime dosing of 75 mg and this Friday 9/27 she will increase to 100 mg at bedtime. On October 4th Dr [name] has patient continuing 100 mg PM dosing but adding 25 mg in the morning for a week, then each week she adds 25 mg to AM dose and continues 100 mg at night dosing until she reaches goal dose of 100 mg BID. Sandra Miller verbalized understanding and appreciation for the clarification.

## 2023-05-30 ENCOUNTER — Ambulatory Visit: Payer: PRIVATE HEALTH INSURANCE

## 2023-05-30 ENCOUNTER — Encounter: Payer: Self-pay | Admitting: Hematology and Oncology

## 2023-05-30 DIAGNOSIS — R2681 Unsteadiness on feet: Secondary | ICD-10-CM

## 2023-05-30 DIAGNOSIS — M25512 Pain in left shoulder: Secondary | ICD-10-CM

## 2023-05-30 DIAGNOSIS — M6281 Muscle weakness (generalized): Secondary | ICD-10-CM | POA: Diagnosis not present

## 2023-05-30 DIAGNOSIS — R2689 Other abnormalities of gait and mobility: Secondary | ICD-10-CM

## 2023-05-30 NOTE — Therapy (Signed)
OUTPATIENT PHYSICAL THERAPY TREATMENT NOTE  Patient Name: Sandra Miller MRN: [id number] DOB:[birth year], 47 y.o., female Today's Date: 05/30/2023  END OF SESSION:  PT End of Session - 05/30/23 1514     Visit Number 7    Number of Visits 13    Date for PT Re-Evaluation 06/26/23    Authorization Type [employer]    PT Start Time 1530    PT Stop Time 1600    PT Time Calculation (min) 30 min    Activity Tolerance Patient limited by fatigue    Behavior During Therapy WFL for tasks assessed/performed             Past Medical History:  Diagnosis Date   Arthritis    Bipolar 1 disorder (HCC)    Cancer (HCC)    vulva, and breast   Complication of anesthesia    wakes up during procedures   Depression    GAD (generalized anxiety disorder)    Genital HSV    currently per pt  no break out 03-22-2016    GERD (gastroesophageal reflux disease)    Graves disease    Hiatal hernia    History of cervical dysplasia    2012 laser ablation   History of esophageal dilatation    for dysphasia -- x2 dilated   History of gastric ulcer    History of Helicobacter pylori infection    remote hx   History of hidradenitis suppurativa    "gets all over body intermittantly"     History of hypertension    no issue since stopped drinking alcohol 2014   History of kidney stones    History of panic attacks    History of radiation therapy 03/16/2020-05/08/2020   vulva  Dr Antony Blackbird   History of radiation therapy 09/09/2020-10/23/2020   left chest wall/left SCV   Dr Antony Blackbird   Hypertension    Iron deficiency anemia    Left ureteral stone    OCD (obsessive compulsive disorder)    PONV (postoperative nausea and vomiting)    Pre-diabetes    PTSD (post-traumatic stress disorder)    Recovering alcoholic in remission (HCC)    since 2014   RLS (restless legs syndrome)    Smokers' cough (HCC)    Thyroid disease    Urgency of urination    Yeast infection involving the vagina and surrounding  area    secondary to taking antibiotic   Past Surgical History:  Procedure Laterality Date   CESAREAN SECTION  1995   w/  Bilateral Tubal Ligation   COLONOSCOPY  last one 08-09-2013   CYSTOSCOPY W/ URETERAL STENT PLACEMENT Left 03/29/2016   Procedure: CYSTOSCOPY WITH STENT REPLACEMENT;  Surgeon: Hildred Laser, MD;  Location: [facility];  Service: Urology;  Laterality: Left;   CYSTOSCOPY WITH RETROGRADE PYELOGRAM, URETEROSCOPY AND STENT PLACEMENT Left 03/08/2016   Procedure: CYSTOSCOPY WITH  LEFT RETROGRADE PYELOGRAM, AND STENT PLACEMENT;  Surgeon: Hildred Laser, MD;  Location: [facility];  Service: Urology;  Laterality: Left;   CYSTOSCOPY/RETROGRADE/URETEROSCOPY/STONE EXTRACTION WITH BASKET Left 03/29/2016   Procedure: CYSTOSCOPY/RETROGRADE/URETEROSCOPY/STONE EXTRACTION WITH BASKET;  Surgeon: Hildred Laser, MD;  Location: [facility];  Service: Urology;  Laterality: Left;   ENDOMETRIAL ABLATION W/ NOVASURE  04-01-2010   ESOPHAGOGASTRODUODENOSCOPY  last one 08-09-2013   KNEE ARTHROSCOPY Left as teen   LASER ABLATION OF THE CERVIX  2012 approx   MASTECTOMY WITH AXILLARY LYMPH NODE DISSECTION Left 07/27/2020   Procedure: LEFT MASTECTOMY WITH  LEFT RADIOACTIVE SEED GUIDED TARGETED AXILLARY LYMPH NODE DISSECTION;  Surgeon: Ovidio Kin, MD;  Location: [facility];  Service: General;  Laterality: Left;   PORT-A-CATH REMOVAL Right 05/18/2021   Procedure: REMOVAL PORT-A-CATH;  Surgeon: Manus Rudd, MD;  Location: [facility];  Service: General;  Laterality: Right;   PORTACATH PLACEMENT Right 03/06/2020   Procedure: INSERTION PORT-A-CATH WITH ULTRASOUND GUIDANCE;  Surgeon: Ovidio Kin, MD;  Location: [facility];  Service: General;  Laterality: Right;   ROBOTIC ASSISTED TOTAL HYSTERECTOMY WITH BILATERAL SALPINGO OOPHERECTOMY N/A 05/18/2021   Procedure: XI ROBOTIC ASSISTED TOTAL HYSTERECTOMY WITH BILATERAL SALPINGO OOPHORECTOMY;  Surgeon: Adolphus Birchwood, MD;   Location: [facility];  Service: Gynecology;  Laterality: N/A;   TRANSTHORACIC ECHOCARDIOGRAM  05-19-2006   lvsf normal, ef 55-65%, there was mild flattening of the interventricular septum during diastoli/  RV size at upper limits normal   TUBAL LIGATION     VULVA /PERINEUM BIOPSY N/A 05/18/2021   Procedure: VULVAR BIOPSY;  Surgeon: Adolphus Birchwood, MD;  Location: [facility];  Service: Gynecology;  Laterality: N/A;   WISDOM TOOTH EXTRACTION  age 48 's   Patient Active Problem List   Diagnosis Date Noted   Mood disorder (HCC) 05/29/2023   Fatigue 05/15/2023   Prolonged QT interval 04/10/2023   Dizziness 04/10/2023   Provoked seizures (HCC) 03/31/2023   Respiratory depression 03/31/2023   Status epilepticus (HCC) 03/31/2023   Migraine with aura and without status migrainosus, not intractable 10/03/2022   Pain in right knee 09/07/2022   Bilateral primary osteoarthritis of knee 09/07/2022   Chronic migraine without aura without status migrainosus, not intractable 07/07/2022   Graves disease 05/10/2022   Sebaceous cyst 12/06/2021   Pain in left knee 11/03/2021   Hyperthyroidism 08/13/2021   Bipolar 1 disorder (HCC) 05/07/2021   Obesity (BMI 30.0-34.9) 05/07/2021   Hyperlipidemia 08/12/2020   Prediabetes 08/12/2020   Hypomagnesemia 08/12/2020   Mutation in BRIP1 gene 06/24/2020   Hypokalemia 06/17/2020   Dehydration    Colitis 06/16/2020   Port-A-Cath in place 03/11/2020   Malignant neoplasm of upper-inner quadrant of left breast in female, estrogen receptor positive (HCC) 02/25/2020   Vulvar cancer (HCC) 01/29/2020   Vulvar ulceration 01/23/2020   Essential hypertension 10/31/2019   Nausea and vomiting 12/27/2018   Seasonal allergic rhinitis 12/27/2018   History of ELISA positive for HSV 10/01/2018   Restless leg syndrome 04/01/2016   Nephrolithiasis 03/08/2016   Obstructive pyelonephritis 03/08/2016   Normocytic anemia 03/08/2016   Hidradenitis suppurativa of left axilla 11/03/2015    Loss of weight 09/08/2015   Neck pain on left side 10/22/2014   Genital warts 05/20/2014   GERD (gastroesophageal reflux disease) 12/26/2012   Functional dyspepsia 12/26/2012   Tobacco abuse 08/22/2012   Herpes simplex virus (HSV) infection 02/03/2007    PCP: Marcine Matar, MD  REFERRING PROVIDER: Marcine Matar, MD 05/11/23 shoulder referral - Madelyn Brunner, DO  REFERRING DIAG: Weakness [R53.1], Gait disturbance [R26.9]  05/11/23 referral: M75.42 (ICD-10-CM) - Impingement syndrome of left shoulder M25.512,G89.29 (ICD-10-CM) - Chronic left shoulder pain M89.8X1,G89.29 (ICD-10-CM) - Chronic scapular pain  THERAPY DIAG:  Muscle weakness (generalized)  Other abnormalities of gait and mobility  Unsteadiness on feet  Left shoulder pain, unspecified chronicity  Rationale for Evaluation and Treatment: Rehabilitation  ONSET DATE: 03/31/23  SUBJECTIVE:   SUBJECTIVE STATEMENT: Patient reports that she has been feeling extra fatigued since yesterday.   PERTINENT HISTORY: NO BP OR IV IN LEFT UE d/t Lymphedema   PMHx includes  Arthritis, Bipolar 1, Vulva and Breast Cancer (currently in remission), Depression, GAD, Graves Disease, Anemia, OCD, pre-diabetes, PTSD, restless leg syndrome, seizure, chronic migraine    PAIN:  Are you having pain? Yes, L shoulder pain 8/10  Pain best/worst: 8-10/10 Aggravating factors: sitting/standing unsupported Easing factors: having arm supported, medication patches  PRECAUTIONS: Other: Lymphedema Alert - NO BP on LEFT ARM    WEIGHT BEARING RESTRICTIONS: No  FALLS:  Has patient fallen in last 6 months?  "Not that I recall"  LIVING ENVIRONMENT: Lives with: lives with their family Lives in: House/apartment Stairs: No Has following equipment at home: Walker - 4 wheeled, bed side commode, and Grab bars  OCCUPATION: CNA  PLOF: Independent  PATIENT GOALS: "I want to be able to go back to work."   NEXT MD VISIT: 04/26/23 Neurologist,  05/02/23 Orthopedic   OBJECTIVE: (objective measures completed at initial evaluation unless otherwise dated)   DIAGNOSTIC FINDINGS:   04/05/23: HIP UNILAT W or WO Pelvis 2-3 views IMPRESSION: Mild-to-moderate right and mild left femoroacetabular osteoarthritis.  PATIENT SURVEYS:  FOTO 39 current, 56 predicted   COGNITION: Overall cognitive status: Impaired - related to recent changes with memory as noted in subjective assessment      SENSATION: Not tested  POSTURE: rounded shoulders and posterior pelvic tilt   UPPER EXTREMITY ROM:  A/PROM Right 05/15/23 Left 05/15/23  Shoulder flexion 158 deg 122 deg *  Shoulder abduction    Shoulder internal rotation    Shoulder external rotation (functional) C6 C5  Elbow flexion WNL WNL  Elbow extension WNL WNL  Wrist flexion    Wrist extension     (Blank rows = not tested) (Key: WFL = within functional limits not formally assessed, * = concordant pain, s = stiffness/stretching sensation, NT = not tested)  Comments:    LOWER EXTREMITY MMT:  MMT Right eval Left eval  Hip flexion 3+ 3+  Hip extension 4- 4-  Hip abduction 4- 4-  Hip adduction    Hip internal rotation 3+ 4-  Hip external rotation 3+ 4-  Knee flexion 3+ 4-  Knee extension 3+ 3+  Ankle dorsiflexion 4+ 4+  Ankle plantarflexion    Ankle inversion    Ankle eversion     (Blank rows = not tested)  UPPER EXTREMITY MMT:  MMT Right 05/15/23 Left 05/15/23  Shoulder flexion 4 4 s  Shoulder extension    Shoulder abduction 4+ 4+  Shoulder extension    Shoulder internal rotation 4 4-  Shoulder external rotation 4 4  Elbow flexion    Elbow extension    Grip strength    (Blank rows = not tested)  (Key: WFL = within functional limits not formally assessed, * = concordant pain, s = stiffness/stretching sensation, NT = not tested)  Comments:    FUNCTIONAL TESTS:  Timed up and go (TUG): TBD  Balance    Romberg EO on floor  30 sec, Min sway  Romberg EC on floor 30  sec, min sway  Romberg EO on airex 30 sec, mod sway  Romberg EC on airex  30 sec, mod sway     Balance Right Left   Tandem on floor 30 sec 30 sec  SLS on floor  11 sec 7 sec    GAIT: Distance walked: 50 feet Assistive device utilized: None Level of assistance: SBA Comments: slow gait speed - BIL small step length with decreased stance time    TODAY'S TREATMENT [facility] Adult PT Treatment:  DATE: 05/30/23 Therapeutic Exercise: Nustep level 4 x 7 mins (250 steps) LE only Red band row x10 cues for reduced elbow compensations Red band shoulder ext x10 cues for posture Red band ER/IR x10 each cues for setup  Standing heel raises x10 Standing toe raises x10 Eyes closed static stance 30sec CGA Eyes open/closed on airex static stance 2x30sec each CGA for safety, counter to assist with getting onto airex Standing march 2x30" BIL no UE support, CGA for safety and emphasis on slow pacing, upright posture Tandem stance on ground x30" BIL, on airex x30" BIL   [facility] Adult PT Treatment:                                                DATE: 05/24/23 Therapeutic Exercise: Nustep level 4 x 6 mins (200 steps) LE only Red band row x10 cues for reduced elbow compensations Red band shoulder ext x10 cues for posture Red band ER/IR x10 each cues for setup  Standing heel raises x10 Standing toe raises x10 Eyes closed static stance 30sec CGA Eyes open/closed on airex static stance 2x30sec each CGA for safety, counter to assist with getting onto airex Standing march 108 BIL no UE support, CGA for safety and emphasis on slow pacing, upright posture Tandem stance on ground x30" BIL   [facility] Adult PT Treatment:                                                DATE: 05/22/23 Therapeutic Exercise: Red band row x10 cues for reduced elbow compensations Red band shoulder ext x10 cues for posture Red band ER/IR x10 each cues for setup  Eyes closed static stance 30sec  CGA Eyes open/closed on airex static stance 2x30sec each CGA for safety, ski pole to assist with getting onto airex Standing march x8 BIL no UE support, CGA for safety and emphasis on slow pacing, upright posture                                                                                                                   PATIENT EDUCATION:  Education details: rationale for interventions, PT exam findings and POC  Person educated: Patient Education method: Medical illustrator Education comprehension: verbalized understanding, returned demonstration, and needs further education  HOME EXERCISE PROGRAM: Access Code: [email or web address] URL: [email or web address] Date: 05/15/2023 Prepared by: Fransisco Hertz  Exercises - Sit to Stand with Armchair  - 2-3 x daily - 7 x weekly - 1 sets - 5 reps - Heel Raises with Counter Support  - 2-3 x daily - 7 x weekly - 1 sets - 8 reps - Seated Scapular Retraction  - 2-3 x daily - 7 x weekly - 1 sets - 8 reps  ASSESSMENT:  CLINICAL IMPRESSION: Patient presents to PT reporting continued debilitating fatigue and that she has had some bad days around her mood recently and that her doctors are changing her seizure medication around slowly. Session today continued to focus on LE strengthening, balance tasks, and periscapular strengthening. She is limited by fatigue during session. Patient continues to benefit from skilled PT services and should be progressed as able to improve functional independence.   OBJECTIVE IMPAIRMENTS: Abnormal gait, decreased activity tolerance, decreased balance, decreased endurance, decreased mobility, difficulty walking, decreased strength, impaired perceived functional ability, improper body mechanics, postural dysfunction, and pain.   ACTIVITY LIMITATIONS: carrying, lifting, standing, squatting, stairs, transfers, bed mobility, locomotion level, and caring for others  PARTICIPATION LIMITATIONS: cleaning, community  activity, and occupation  PERSONAL FACTORS: Past/current experiences, Time since onset of injury/illness/exacerbation, and 3+ comorbidities: PMHx includes Arthritis, Bipolar 1, Vulva and Breast Cancer (currently in remission), Depression, GAD, Graves Disease, Anemia, OCD, pre-diabetes, PTSD, restless leg syndrome, seizure, chronic migraine   are also affecting patient's functional outcome.   REHAB POTENTIAL: Fair    CLINICAL DECISION MAKING: Evolving/moderate complexity  EVALUATION COMPLEXITY: Moderate   GOALS: Goals reviewed with patient? Yes  SHORT TERM GOALS: Target date: 05/16/2023   Patient will be independent with initial home program for LE strengthening and balance . Baseline: to be initiated at follow up visit  05/15/23: HEP established Goal status: ONGOING  2.  Patient will ambulate 6 minutes with LRAD without LOB.  Baseline: requires RW, unsteady with walking >50-159ft 05/15/23: ambulates without device within clinic, no instability or LOB noted Goal status: ONGOING   LONG TERM GOALS: Target date: 06/26/2023 (updated 05/15/23 given updated referral to include shoulder) Patient will report improved overall functional ability with FOTO score of 56 predicted.  Baseline: 39 current Goal status: INITIAL  2.  Patient will perform SLS at least 20 sec on each LE in order to maximize safe and independent function.  Baseline:  Balance Right Left   Tandem on floor 30 sec 30 sec  SLS on floor  11 sec 7 sec   Goal status: INITIAL  3.  Patient will demonstrate at least 4 or 4+/5 MMT with all LE strength testing.   Baseline:  MMT Right eval Left eval  Hip flexion 3+ 3+  Hip extension 4- 4-  Hip abduction 4- 4-  Hip adduction    Hip internal rotation 3+ 4-  Hip external rotation 3+ 4-  Knee flexion 3+ 4-  Knee extension 3+ 3+  Ankle dorsiflexion 4+ 4+   Goal status: INITIAL  4.  Patient will demonstrate ability to walk 6+ minutes at normal gait speed without AD or LOB.   Baseline: requires RW  Goal status: INITIAL  5.  Patient will demonstrate ability to ambulate over varied surfaces, including inclines without LOB and no more than minimal fatigue after 15 minutes of weight bearing activities.  Baseline: unable  Goal status: INITIAL  6. Pt will demonstrate at least 145 deg L shoulder elevation AROM in order to facilitate improved tolerance to daily tasks.  Baseline: see ROM chart above  Goal status: NEW 05/15/23  7. Pt will report at least 50% decrease in overall pain levels in past week in order to facilitate improved tolerance to basic ADLs/mobility.   Baseline: 7-10/10  Goal status: NEW 05/15/23     PLAN: updated 05/15/23 to include shoulder POC  PT FREQUENCY: 1-2x/week    PT DURATION: 6 weeks  PLANNED INTERVENTIONS: Therapeutic exercises, Therapeutic activity, Neuromuscular re-education, Balance  training, Gait training, Patient/Family education, Self Care, Joint mobilization, Electrical stimulation, Cryotherapy, Moist heat, Manual therapy, and Re-evaluation  PLAN FOR NEXT SESSION: balance (narrow BOS, SLS, uneven surfaces), gait activities encouraging increased SLS time, reactive balance, BIL LE strengthening. Postural mobility and GH mobility within pt tolerance, would likely benefit from gradual IR strengthening and scapular work. HEP update    Berta Minor PTA 05/30/2023 3:59 PM

## 2023-05-31 ENCOUNTER — Other Ambulatory Visit: Payer: Self-pay | Admitting: Neurology

## 2023-05-31 LAB — LEVETIRACETAM LEVEL: Levetiracetam Lvl: 17.7 ug/mL (ref 10.0–40.0)

## 2023-05-31 LAB — LAMOTRIGINE LEVEL: Lamotrigine Lvl: 1.3 ug/mL — ABNORMAL LOW (ref 2.0–20.0)

## 2023-06-01 ENCOUNTER — Inpatient Hospital Stay: Payer: PRIVATE HEALTH INSURANCE

## 2023-06-01 ENCOUNTER — Ambulatory Visit: Payer: PRIVATE HEALTH INSURANCE

## 2023-06-01 ENCOUNTER — Other Ambulatory Visit (HOSPITAL_COMMUNITY): Payer: Self-pay

## 2023-06-01 DIAGNOSIS — T733XXA Exhaustion due to excessive exertion, initial encounter: Secondary | ICD-10-CM

## 2023-06-01 DIAGNOSIS — C50212 Malignant neoplasm of upper-inner quadrant of left female breast: Secondary | ICD-10-CM | POA: Diagnosis not present

## 2023-06-01 MED ORDER — CYANOCOBALAMIN 1000 MCG/ML IJ SOLN
1000.0000 ug | Freq: Once | INTRAMUSCULAR | Status: AC
  Administered 2023-06-01: 1000 ug via INTRAMUSCULAR
  Filled 2023-06-01: qty 1

## 2023-06-01 MED ORDER — LEVETIRACETAM 750 MG PO TABS
750.0000 mg | ORAL_TABLET | Freq: Two times a day (BID) | ORAL | 0 refills | Status: DC
  Filled 2023-06-01: qty 60, 30d supply, fill #0

## 2023-06-02 ENCOUNTER — Other Ambulatory Visit (INDEPENDENT_AMBULATORY_CARE_PROVIDER_SITE_OTHER): Payer: No Typology Code available for payment source

## 2023-06-02 ENCOUNTER — Other Ambulatory Visit (HOSPITAL_COMMUNITY): Payer: Self-pay

## 2023-06-02 ENCOUNTER — Encounter: Payer: Self-pay | Admitting: Physician Assistant

## 2023-06-02 ENCOUNTER — Ambulatory Visit (INDEPENDENT_AMBULATORY_CARE_PROVIDER_SITE_OTHER): Payer: No Typology Code available for payment source | Admitting: Physician Assistant

## 2023-06-02 VITALS — BP 122/90 | HR 80 | Ht 67.0 in | Wt 182.4 lb

## 2023-06-02 DIAGNOSIS — A09 Infectious gastroenteritis and colitis, unspecified: Secondary | ICD-10-CM | POA: Diagnosis not present

## 2023-06-02 DIAGNOSIS — R569 Unspecified convulsions: Secondary | ICD-10-CM

## 2023-06-02 DIAGNOSIS — R112 Nausea with vomiting, unspecified: Secondary | ICD-10-CM

## 2023-06-02 DIAGNOSIS — R1013 Epigastric pain: Secondary | ICD-10-CM

## 2023-06-02 LAB — CBC WITH DIFFERENTIAL/PLATELET
Basophils Absolute: 0.1 10*3/uL (ref 0.0–0.1)
Basophils Relative: 0.8 % (ref 0.0–3.0)
Eosinophils Absolute: 0.1 10*3/uL (ref 0.0–0.7)
Eosinophils Relative: 0.7 % (ref 0.0–5.0)
HCT: 40.5 % (ref 36.0–46.0)
Hemoglobin: 13.1 g/dL (ref 12.0–15.0)
Lymphocytes Relative: 35.6 % (ref 12.0–46.0)
Lymphs Abs: 3.1 10*3/uL (ref 0.7–4.0)
MCHC: 32.2 g/dL (ref 30.0–36.0)
MCV: 89.5 fL (ref 78.0–100.0)
Monocytes Absolute: 0.6 10*3/uL (ref 0.1–1.0)
Monocytes Relative: 6.5 % (ref 3.0–12.0)
Neutro Abs: 5 10*3/uL (ref 1.4–7.7)
Neutrophils Relative %: 56.4 % (ref 43.0–77.0)
Platelets: 339 10*3/uL (ref 150.0–400.0)
RBC: 4.53 Mil/uL (ref 3.87–5.11)
RDW: 13.7 % (ref 11.5–15.5)
WBC: 8.8 10*3/uL (ref 4.0–10.5)

## 2023-06-02 LAB — COMPREHENSIVE METABOLIC PANEL
ALT: 10 U/L (ref 0–35)
AST: 16 U/L (ref 0–37)
Albumin: 4.6 g/dL (ref 3.5–5.2)
Alkaline Phosphatase: 56 U/L (ref 39–117)
BUN: 15 mg/dL (ref 6–23)
CO2: 26 meq/L (ref 19–32)
Calcium: 10.4 mg/dL (ref 8.4–10.5)
Chloride: 100 meq/L (ref 96–112)
Creatinine, Ser: 0.78 mg/dL (ref 0.40–1.20)
GFR: 89.14 mL/min (ref >60.00)
Glucose, Bld: 105 mg/dL — ABNORMAL HIGH (ref 70–99)
Potassium: 4.3 meq/L (ref 3.5–5.1)
Sodium: 135 meq/L (ref 135–145)
Total Bilirubin: 0.5 mg/dL (ref 0.2–1.2)
Total Protein: 8 g/dL (ref 6.0–8.3)

## 2023-06-02 LAB — HIGH SENSITIVITY CRP: CRP, High Sensitivity: 2.42 mg/L (ref 0.000–5.000)

## 2023-06-02 LAB — TSH: TSH: 2.67 u[IU]/mL (ref 0.35–5.50)

## 2023-06-02 LAB — SEDIMENTATION RATE: Sed Rate: 61 mm/h — ABNORMAL HIGH (ref 0–20)

## 2023-06-02 MED ORDER — GABAPENTIN 300 MG PO CAPS
300.0000 mg | ORAL_CAPSULE | Freq: Every day | ORAL | 1 refills | Status: DC
Start: 2023-06-02 — End: 2023-08-14
  Filled 2023-06-02 – 2023-06-16 (×2): qty 30, 30d supply, fill #0
  Filled 2023-07-15: qty 30, 30d supply, fill #1

## 2023-06-02 MED ORDER — FAMOTIDINE 40 MG PO TABS
40.0000 mg | ORAL_TABLET | Freq: Every day | ORAL | 3 refills | Status: DC
Start: 2023-06-02 — End: 2024-05-24
  Filled 2023-06-02: qty 30, 30d supply, fill #0
  Filled 2023-06-28: qty 30, 30d supply, fill #1
  Filled 2023-07-28: qty 30, 30d supply, fill #2
  Filled 2023-08-27: qty 30, 30d supply, fill #3
  Filled 2023-09-22: qty 30, 30d supply, fill #4
  Filled 2023-10-22: qty 30, 30d supply, fill #5
  Filled 2023-11-21: qty 30, 30d supply, fill #6
  Filled 2023-12-17: qty 30, 30d supply, fill #7
  Filled 2024-01-16 – 2024-01-19 (×2): qty 30, 30d supply, fill #8
  Filled 2024-02-15: qty 30, 30d supply, fill #9
  Filled 2024-03-22 – 2024-03-31 (×2): qty 30, 30d supply, fill #10
  Filled 2024-04-27: qty 30, 30d supply, fill #11

## 2023-06-02 MED ORDER — PROMETHAZINE HCL 12.5 MG PO TABS
12.5000 mg | ORAL_TABLET | Freq: Three times a day (TID) | ORAL | 1 refills | Status: DC | PRN
  Filled 2023-06-02: qty 30, 10d supply, fill #0
  Filled 2023-08-06: qty 30, 10d supply, fill #1

## 2023-06-02 MED ORDER — OMEPRAZOLE 40 MG PO CPDR
40.0000 mg | DELAYED_RELEASE_CAPSULE | Freq: Every day | ORAL | 3 refills | Status: DC
Start: 2023-06-02 — End: 2024-06-06
  Filled 2023-06-02: qty 90, 90d supply, fill #0
  Filled 2023-06-11: qty 30, 30d supply, fill #0
  Filled 2023-07-10: qty 30, 30d supply, fill #1
  Filled 2023-08-09: qty 30, 30d supply, fill #2
  Filled 2023-09-09 – 2023-09-13 (×3): qty 30, 30d supply, fill #3
  Filled 2023-10-17: qty 30, 30d supply, fill #4
  Filled 2024-01-06: qty 30, 30d supply, fill #5
  Filled 2024-02-15: qty 30, 30d supply, fill #6
  Filled 2024-03-13: qty 30, 30d supply, fill #7
  Filled 2024-04-11: qty 30, 30d supply, fill #8
  Filled 2024-05-11: qty 30, 30d supply, fill #9

## 2023-06-02 NOTE — Patient Instructions (Addendum)
You have been scheduled for a colonoscopy/endoscopy. Please follow written instructions given to you at your visit today.   Please pick up your prep supplies at the pharmacy within the next 1-3 days.  If you use inhalers (even only as needed), please bring them with you on the day of your procedure.  DO NOT TAKE 7 DAYS PRIOR TO TEST- Trulicity (dulaglutide) Ozempic, Wegovy (semaglutide) Mounjaro (tirzepatide) Bydureon Bcise (exanatide extended release)  DO NOT TAKE 1 DAY PRIOR TO YOUR TEST Rybelsus (semaglutide) Adlyxin (lixisenatide) Victoza (liraglutide) Byetta (exanatide) ___________________________________________________________________________  Your provider has requested that you go to the basement level for lab work before leaving today. Press "B" on the elevator. The lab is located at the first door on the left as you exit the elevator.  We have sent the following medications to your pharmacy for you to pick up at your convenience: Pepcid Gabapentin  prilosec   Please take your proton pump inhibitor medication, omeprazole in the morning  Please take this medication 30 minutes to 1 hour before meals- this makes it more effective.  Pepcid at night Avoid spicy and acidic foods Avoid fatty foods Limit your intake of coffee, tea, alcohol, and carbonated drinks Work to maintain a healthy weight Keep the head of the bed elevated at least 3 inches with blocks or a wedge pillow if you are having any nighttime symptoms Stay upright for 2 hours after eating Avoid meals and snacks three to four hours before bedtime  - Drink a lot of liquids that have water, salt, and sugar. Good choices are water mixed with juice, flavored soda, and soup broth. If you are drinking enough, your urine will be light yellow or almost clear.  - Try to eat a little food. Good choices are potatoes, noodles, rice, oatmeal, crackers, bananas, soup, and boiled vegetables.  - Avoid high fat foods, as they  can make diarrhea worse.  - Dairy products (except yogurt) may be difficult to digest when you have diarrhea. I recommend that you temporarily avoid lactose-containing foods.  - Can try loperamide 4 mg initially, then 2 mg after each unformed stool for =2 days, with a maximum of 16 mg/day.  - If loperamide is not working, you could try bismuth salicylate (Pepto-Bismol) 30 mL or two tablets every 30 minutes for eight doses. Pepto-Bismol may make your stools black.   Go to the ER if any severe abdominal pain, fever, or weakness  First do a trial off milk/lactose products if you use them.  Add fiber like benefiber or citracel once a day Increase activity Can do trial of IBGard which is over the counter for AB pain- Take 1-2 capsules once a day for maintence or twice a day during a flare    FODMAP stands for fermentable oligo-, di-, mono-saccharides and polyols (1). These are the scientific terms used to classify groups of carbs that are difficult for our body to digest and that are notorious for triggering digestive symptoms like bloating, gas, loose stools and stomach pain.   You can try low FODMAP diet  - start with eliminating just one column at a time that you feel may be a trigger for you. - the table at the very bottom contains foods that are low in FODMAPs   Sometimes trying to eliminate the FODMAP's from your diet is difficult or tricky, if you are stuggling with trying to do the elimination diet you can try an enzyme.  There is a food enzymes that you sprinkle  in or on your food that helps break down the FODMAP. You can read more about the enzyme by going to this site: https://fodzyme.com/

## 2023-06-02 NOTE — Progress Notes (Signed)
06/02/2023 KENYONA LITTERIO [id number] [birth year]  Referring provider: Marcine Matar, MD Primary GI doctor: Dr. Leone Payor  ASSESSMENT AND PLAN:     Chronic Diarrhea New onset diarrhea following hospitalization for seizures. No blood in stool or abdominal pain. Possible GI losses contributing to low magnesium levels. -Collect stool sample for pathogen testing to rule out infection such as C. diff. -Trial of Imodium as needed. -Avoid dairy products. -Schedule colonoscopy and endoscopy to investigate possible causes, including sigmoid colon thickening noted on previous CT scan in August. We have discussed the risks of bleeding, infection, perforation, medication reactions, and remote risk of death associated with colonoscopy. All questions were answered and the patient acknowledges these risk and wishes to proceed.  Nausea/Vomiting Negative GES, CT unremarkable Chronic nausea and history of vomiting, improved since hospitalization. Possible functional, electrolyte abnormality -Refill Phenergan prescription, low dose with history of QT prolongation -Continue omeprazole 40mg  in the morning, 30 minutes to an hour before food. -Add famotidine 40mg  at bedtime. Will schedule EGD with colonoscopy. I discussed risks of EGD with patient today, including risk of sedation, bleeding or perforation.  Patient provides understanding and gave verbal consent to proceed. -consider baclofen  Seizures Recent onset of seizures, possibly related to low magnesium levels versus vasovagal. Currently weaning off Keppra. -Continue current management under neurology. - should be acceptable at [facility], last seizure activity was in July, has not had any since. Will reach out to John to see if he can review chart and if she can be at [facility].   GERD History of GERD with recent increase in heartburn symptoms. -Continue omeprazole 40mg  in the morning, 30 minutes to an hour before food. -Add famotidine 40mg  at  bedtime.    Patient Care Team: Marcine Matar, MD as PCP - General (Internal Medicine) Serena Croissant, MD as Consulting Physician (Hematology and Oncology) Ovidio Kin, MD as Consulting Physician (General Surgery) Paulina Fusi Servando Snare, RN as Oncology Nurse Navigator (Oncology) Adolphus Birchwood, MD as Consulting Physician (Gynecologic Oncology) Doylene Bode, NP as Nurse Practitioner (Gynecologic Oncology) Antony Blackbird, MD as Consulting Physician (Radiation Oncology) Maryclare Labrador, RN as Registered Nurse Antionette Char, MD as Consulting Physician (Obstetrics and Gynecology)  HISTORY OF PRESENT ILLNESS: 47 y.o. female with a past medical history of chronic abdominal pain/functional dyspepsia, remote H. pylori infection , generalized anxiety disorder, OCD, PTSD, alcoholism (in remission), kidney stones, breast cancer, vulvar cancer , Graves' disease and others listed below presents for evaluation of ER follow up.   Discussed the use of AI scribe software for clinical note transcription with the patient, who gave verbal consent to proceed.  History of Present Illness   The patient, a 47 year old with a complex medical history including bipolar disorder, vulvar and breast cancer, Graves' disease, GERD with esophageal dilation, gastric ulcer, history of H. Pylori infection post-eradication, hiatal hernia, and a history of alcoholism, presents for evaluation following an ER visit. The patient has been experiencing recurrent vomiting for several months, which has since subsided following a hospitalization for seizures. The seizures, a new symptom, were thought to be due to low magnesium levels.  Despite the cessation of vomiting, the patient reports persistent daily nausea. The patient also reports a change in bowel habits, with immediate diarrhea following meals, which has been ongoing since their hospitalization. The patient denies any abdominal discomfort, cramping, bloating, or gas. There is no  reported blood in the stool, but the patient does report occasional fecal incontinence.  The patient has  experienced significant weight loss, to the point where their clothes no longer fit. They also report a history of weight fluctuation due to cancer treatment and medication side effects.  The patient's medication regimen includes omeprazole and gabapentin, with recent changes to their omeprazole dosage. The patient also reports a history of taking Phenergan for nausea, which was effective but has not been recently refilled.  The patient's recent imaging studies, including a CT abdomen and pelvis and a gastric emptying study, were largely unremarkable. However, the CT did show possible mild wall thickening of the sigmoid colon without surrounding inflammation, which could be related to previous pelvic radiation.  The patient's recent seizure activity has resulted in a driving restriction and frequent medical appointments. The patient's last seizure activity was in July, and they have not had any since. The patient's MRI brain scan was reported as normal.  The patient's sleep schedule is irregular, with sleep onset at 7 PM and waking at 3 AM. The patient's eating habits are also irregular, with meals typically consumed at 3 AM and late in the afternoon. The patient reports immediate diarrhea following these meals.  The patient has a history of cancer and is currently in remission. They also report a history of radiation therapy to the left chest wall and left SCV in 2022. The patient is a recovering alcoholic and denies current alcohol use. They also deny current use of NSAIDs.        Wt Readings from Last 8 Encounters:  06/02/23 182 lb 6.4 oz (82.7 kg)  05/29/23 178 lb (80.7 kg)  05/15/23 178 lb 6.4 oz (80.9 kg)  04/26/23 183 lb (83 kg)  04/14/23 186 lb (84.4 kg)  04/10/23 192 lb 14.4 oz (87.5 kg)  04/04/23 193 lb 12.6 oz (87.9 kg)  03/06/23 190 lb (86.2 kg)    She  reports that she has  quit smoking. Her smoking use included cigarettes. She has never used smokeless tobacco. She reports that she does not currently use alcohol. She reports that she does not currently use drugs after having used the following drugs: Marijuana.  Previous GI Evaluation    December 2014 colonoscopy with random biopsies recall 08/2023 -Normal -Colon biopsies normal   December 2014 EGD with small bowel biopsies for epigastric and chest pain -normal.  -Biopsies normal  RELEVANT LABS AND IMAGING:  Results   RADIOLOGY CT abdomen and pelvis with contrast: No definite acute findings, possible mild wall thickening of sigmoid colon without surrounding inflammation, non-obstructing right renal calculus (04/11/2023) MRI brain: Normal (03/2023)  DIAGNOSTIC Gastric emptying study: Unremarkable (04/24/2023)      CBC    Component Value Date/Time   WBC 9.8 04/12/2023 0046   RBC 4.14 04/12/2023 0046   HGB 12.5 04/12/2023 0046   HGB 12.9 11/22/2022 1633   HCT 38.0 04/12/2023 0046   HCT 38.6 11/22/2022 1633   PLT 421 (H) 04/12/2023 0046   PLT 318 11/22/2022 1633   MCV 91.8 04/12/2023 0046   MCV 89 11/22/2022 1633   MCH 30.2 04/12/2023 0046   MCHC 32.9 04/12/2023 0046   RDW 13.9 04/12/2023 0046   RDW 13.6 11/22/2022 1633   LYMPHSABS 3.2 04/12/2023 0046   LYMPHSABS 3.9 (H) 11/22/2022 1633   MONOABS 0.5 04/12/2023 0046   EOSABS 0.1 04/12/2023 0046   EOSABS 0.1 11/22/2022 1633   BASOSABS 0.0 04/12/2023 0046   BASOSABS 0.0 11/22/2022 1633   Recent Labs    01/25/23 0956 03/31/23 1103 03/31/23 1704 04/01/23 0630 04/01/23 8119  04/02/23 0358 04/03/23 0647 04/10/23 0940 04/11/23 0715 04/12/23 0046  HGB 12.8 15.1* 14.6 11.6* 11.1* 13.2 11.4* 12.1 13.0 12.5    CMP     Component Value Date/Time   NA 140 04/14/2023 1207   K 4.4 04/14/2023 1207   CL 100 04/14/2023 1207   CO2 19 (L) 04/14/2023 1207   GLUCOSE 90 04/14/2023 1207   GLUCOSE 104 (H) 04/12/2023 0046   BUN 7 04/14/2023 1207    CREATININE 0.67 04/14/2023 1207   CREATININE 0.75 04/06/2021 1112   CREATININE 1.01 11/10/2016 1012   CALCIUM 10.5 (H) 04/21/2023 1400   CALCIUM 9.8 10/20/2020 1127   PROT 6.8 04/12/2023 0046   PROT 7.2 08/18/2022 1605   ALBUMIN 3.7 04/12/2023 0046   ALBUMIN 4.6 08/18/2022 1605   AST 28 04/12/2023 0046   AST 32 04/06/2021 1112   ALT 39 04/12/2023 0046   ALT 27 04/06/2021 1112   ALKPHOS 45 04/12/2023 0046   BILITOT 0.9 04/12/2023 0046   BILITOT 0.3 08/18/2022 1605   BILITOT 0.5 04/06/2021 1112   GFRNONAA >60 04/12/2023 0046   GFRNONAA >60 04/06/2021 1112   GFRNONAA 69 11/10/2016 1012   GFRAA >60 06/04/2020 1408   GFRAA 79 11/10/2016 1012      Latest Ref Rng & Units 04/12/2023   12:46 AM 04/11/2023    7:15 AM 04/10/2023    9:40 AM  Hepatic Function  Total Protein 6.5 - 8.1 g/dL 6.8  7.2  6.8   Albumin 3.5 - 5.0 g/dL 3.7  3.6  3.6   AST 15 - 41 U/L 28  37  38   ALT 0 - 44 U/L 39  43  49   Alk Phosphatase 38 - 126 U/L 45  50  39   Total Bilirubin 0.3 - 1.2 mg/dL 0.9  0.9  1.0       Current Medications:   Current Outpatient Medications (Endocrine & Metabolic):    methimazole (TAPAZOLE) 5 MG tablet, Take one tablet (5 mg dose) by mouth daily. (Patient taking differently: Take 5 mg by mouth daily.)  Current Outpatient Medications (Cardiovascular):    lisinopril (ZESTRIL) 10 MG tablet, Take 1 tablet (10 mg total) by mouth daily. Stop the Lisinopril/HCTZ combination   propranolol (INDERAL) 10 MG tablet, Take 1 tablet by mouth 2 times daily.   spironolactone (ALDACTONE) 25 MG tablet, Take 0.5 tablets (12.5 mg total) by mouth daily.  Current Outpatient Medications (Respiratory):    loratadine (CLARITIN REDITABS) 10 MG dissolvable tablet, Take 10 mg by mouth daily. OTC    Current Outpatient Medications (Other):    diclofenac Sodium (VOLTAREN) 1 % GEL, Apply 4 grams topically 4  times daily as needed. (Patient taking differently: Apply 4 g topically 4 (four) times daily as  needed (pain).)   famotidine (PEPCID) 40 MG tablet, Take 1 tablet (40 mg total) by mouth daily.   FLUoxetine (PROZAC) 10 MG capsule, Take 3 capsules (30 mg total) by mouth daily.   lamoTRIgine (LAMICTAL) 25 MG tablet, Then take 1 tablet in the morning and  4 tablets at bedtime x1 week starting October 4th THEN take 2 tablets in morning and 4 tablets at bedtime x1 week THEN take 3 tablets in morning and 4 tablets at bedtime x1 week THEN take 4 tablets in morning and 4 tablets at bedtime.   letrozole (FEMARA) 2.5 MG tablet, Take 1 tablet by mouth daily.   levETIRAcetam (KEPPRA) 750 MG tablet, Take 1 tablet (750 mg total) by mouth 2 (  two) times daily.   levETIRAcetam (KEPPRA) 750 MG tablet, Take 1 tablet (750 mg total) by mouth 2 (two) times daily. Taper off according to instructions given once you have reached goal dose of Lamotrigine.   LORazepam (ATIVAN) 1 MG tablet, Take 1 tablet (1 mg total) by mouth 3 (three) times daily as needed for anxiety   Magnesium 400 MG TABS, Take 800 mg by mouth 2 (two) times daily.   Multiple Vitamin (MULTIVITAMIN WITH MINERALS) TABS tablet, Take 1 tablet by mouth daily.   Multiple Vitamins-Minerals (CERTA-VITE PO), Take by mouth daily. OTC   potassium chloride SA (KLOR-CON M) 20 MEQ tablet, Take 1 tablet (20 mEq) by mouth daily.   gabapentin (NEURONTIN) 300 MG capsule, Take 1 capsule (300 mg) by mouth daily.   omeprazole (PRILOSEC) 40 MG capsule, Take 1 capsule (40 mg total) by mouth daily before breakfast.  Medical History:  Past Medical History:  Diagnosis Date   Arthritis    Bipolar 1 disorder (HCC)    Cancer (HCC)    vulva, and breast   Complication of anesthesia    wakes up during procedures   Depression    GAD (generalized anxiety disorder)    Genital HSV    currently per pt  no break out 03-22-2016    GERD (gastroesophageal reflux disease)    Graves disease    Hiatal hernia    History of cervical dysplasia    2012 laser ablation   History of  esophageal dilatation    for dysphasia -- x2 dilated   History of gastric ulcer    History of Helicobacter pylori infection    remote hx   History of hidradenitis suppurativa    "gets all over body intermittantly"     History of hypertension    no issue since stopped drinking alcohol 2014   History of kidney stones    History of panic attacks    History of radiation therapy 03/16/2020-05/08/2020   vulva  Dr Antony Blackbird   History of radiation therapy 09/09/2020-10/23/2020   left chest wall/left SCV   Dr Antony Blackbird   Hypertension    Iron deficiency anemia    Left ureteral stone    OCD (obsessive compulsive disorder)    PONV (postoperative nausea and vomiting)    Pre-diabetes    PTSD (post-traumatic stress disorder)    Recovering alcoholic in remission (HCC)    since 2014   RLS (restless legs syndrome)    Smokers' cough (HCC)    Thyroid disease    Urgency of urination    Yeast infection involving the vagina and surrounding area    secondary to taking antibiotic   Allergies:  Allergies  Allergen Reactions   Dilaudid [Hydromorphone Hcl] Other (See Comments)    Pt became confused, pulled out iv's, does not remember anything   Aspirin Hives    States able to tolerate Goody Powders and Ibuprofen without any problem    Depakote [Divalproex Sodium] Nausea And Vomiting   Minocycline Hives     Surgical History:  She  has a past surgical history that includes Cesarean section (1995); Knee arthroscopy (Left, as teen); Esophagogastroduodenoscopy (last one 08-09-2013); Colonoscopy (last one 08-09-2013); Cystoscopy with retrograde pyelogram, ureteroscopy and stent placement (Left, 03/08/2016); Endometrial ablation w/ novasure (04-01-2010); Laser ablation of the cervix (2012 approx); Wisdom tooth extraction (age 87 's); transthoracic echocardiogram (05-19-2006); Cystoscopy/retrograde/ureteroscopy/stone extraction with basket (Left, 03/29/2016); Cystoscopy w/ ureteral stent placement (Left,  03/29/2016); Tubal ligation; Portacath placement (Right, 03/06/2020); Mastectomy  with axillary lymph node dissection (Left, 07/27/2020); Robotic assisted total hysterectomy with bilateral salpingo oophorectomy (N/A, 05/18/2021); Vulva / perineum biopsy (N/A, 05/18/2021); and Port-a-cath removal (Right, 05/18/2021). Family History:  Her family history includes ADD / ADHD in her brother; Alcohol abuse in her brother and father; Anxiety disorder in her maternal aunt and mother; Cancer in her cousin and paternal grandfather; Cirrhosis in her cousin; Colon polyps in her brother; Depression in her maternal aunt and mother; Diabetes in her maternal grandfather and paternal grandmother; Drug abuse in her brother and brother; Heart disease in her father and mother; Kidney disease in her maternal uncle; Lung cancer in her cousin and father; Lung cancer (age of onset: 59) in her paternal uncle; Throat cancer in her cousin.  REVIEW OF SYSTEMS  : All other systems reviewed and negative except where noted in the History of Present Illness.  PHYSICAL EXAM: BP (!) 122/90   Pulse 80   Ht 5\' 7"  (1.702 m)   Wt 182 lb 6.4 oz (82.7 kg)   BMI 28.57 kg/m  General Appearance: Well nourished, in no apparent distress. Head:   Normocephalic and atraumatic. Eyes:  sclerae anicteric,conjunctive pink  Respiratory: Respiratory effort normal, BS equal bilaterally without rales, rhonchi, wheezing. Cardio: RRR with no MRGs. Peripheral pulses intact.  Abdomen: Soft,  Obese ,active bowel sounds. mild tenderness in the epigastrium and in the lower abdomen. Without guarding and Without rebound. No masses. Rectal: Normal external rectal exam, normal rectal tone, appreciated internal hemorrhoids, non-tender, no masses, , brown stool, hemoccult Negative Musculoskeletal: Full ROM, Normal gait. Without edema. Skin:  Dry and intact without significant lesions or rashes Neuro: Alert and  oriented x4;  No focal deficits. Psych:  Cooperative.  Normal mood and affect.    Doree Albee, PA-C 2:29 PM

## 2023-06-05 ENCOUNTER — Encounter: Payer: Self-pay | Admitting: Hematology and Oncology

## 2023-06-06 ENCOUNTER — Ambulatory Visit: Payer: No Typology Code available for payment source | Attending: Internal Medicine | Admitting: Internal Medicine

## 2023-06-06 ENCOUNTER — Telehealth (HOSPITAL_COMMUNITY): Payer: No Typology Code available for payment source | Admitting: Psychiatry

## 2023-06-06 ENCOUNTER — Encounter: Payer: Self-pay | Admitting: Internal Medicine

## 2023-06-06 VITALS — BP 119/86 | HR 88 | Temp 99.4°F | Ht 67.0 in | Wt 186.0 lb

## 2023-06-06 DIAGNOSIS — L659 Nonscarring hair loss, unspecified: Secondary | ICD-10-CM

## 2023-06-06 DIAGNOSIS — Z23 Encounter for immunization: Secondary | ICD-10-CM

## 2023-06-06 DIAGNOSIS — R269 Unspecified abnormalities of gait and mobility: Secondary | ICD-10-CM | POA: Diagnosis not present

## 2023-06-06 DIAGNOSIS — M25812 Other specified joint disorders, left shoulder: Secondary | ICD-10-CM

## 2023-06-06 DIAGNOSIS — R531 Weakness: Secondary | ICD-10-CM

## 2023-06-06 DIAGNOSIS — E876 Hypokalemia: Secondary | ICD-10-CM

## 2023-06-06 DIAGNOSIS — R569 Unspecified convulsions: Secondary | ICD-10-CM

## 2023-06-06 NOTE — Progress Notes (Signed)
Patient ID: Sandra Miller, female    DOB: 1976/05/12  MRN: [id number]  CC: Seizures (Seizure f/u. /Pt reports not feeling much progress since Julys incident - ongoing PT for shoulder & gait./Concern about hairloss - endocrinologist belives it is a combination of all diagnosis/Dr.Arfeen requested pt to have an updated EKG/Yes to flu vax)   Subjective: Sandra Miller is a 47 y.o. female who presents for chronic ds management.  Daughter, Melton Alar and her GD are with her. Her concerns today include:  Patient with history of HTN, HL, preDM, hypokalemia,  obesity, tob dep, RLS, DVT of the RT IJV and surrounding IJ port 04/2020 GERD/functional dyspepsia (on gabapentin and high-dose omeprazole), hidradenitis suppurativa, breast CA left dx 02/2020 (ERP s/p left mastectomy, neoadjuvant chemotherapy and adjuvant XRT; Dr. Pamelia Hoit).  Vulvar cancer (dx 01/2020), HSV infection, genitial warts, bipolar disorder/dep/anx (Dr. Lolly Mustache)    Discussed the use of AI scribe software for clinical note transcription with the patient, who gave verbal consent to proceed.  History of Present Illness   Weakness/Gait disturbance: She reports that her gait and strength have  improved with physical therapy, but she still experiences fear and fatigue when walking long distances, such as in a grocery store.  Physical therapist has recommended that she takes her walker with her in the car for when she goes places like the grocery store but she has been reluctant to do so.  Her daughter encourages her to do the same.   -She has seen orthopedics for her left shoulder and diagnosed with impingement syndrome.  Given steroid injection.  Physical therapy recommended. Therapist feels she would benefit from more physical therapy for her gait and shoulder.  She was supposed to return to work the end of this week but she feels she is not quite ready and would like to have it extended as she continues physical therapy.    The patient's hair has  been falling out for about a month, particularly from the crown of her head. She has consulted her endocrinologist, who believes the hair loss may be due to stress rather than her hyperthyroid condition. Her thyroid levels have been checked and are reportedly normal. She has not used any hair treatments or dyes since her cancer diagnosis.  The patient also reports diarrhea after eating. She has been prescribed Imodium by her gastroenterologist.  We decreased the omeprazole from 40 mg twice a day to once a day as it may have been contributing to her electrolyte abnormalities.  Gastroenterologist left her on the once daily dose but added famotidine 40 mg daily.    The patient is currently on Keppra and Lamictal.  Neurology feels that her seizure was provoked due to electrolyte abnormalities.  She has started weaning her off Keppra and is working with her psychiatrist to titrate the Lamictal instead to cover her for both mood disorder and seizures.  Lamictal increased from 3 tablets to 4 daily.  However she started experiencing some itching when the dose was increased to 4 tablets a day so she went back to 3.  She plans to discuss further with her psychiatrist on her next visit.,   Electrolyte disturbance: Recent BMP revealed normal potassium.  Magnesium level was not drawn.  She continues on spironolactone 12.5 mg daily and potassium supplement 20 mEq.  As mentioned above, PPI was decreased to 40 mg once a day.  She has seen the nephrologist. Patient Active Problem List   Diagnosis Date Noted   Mood disorder (  HCC) 05/29/2023   Fatigue 05/15/2023   Prolonged QT interval 04/10/2023   Dizziness 04/10/2023   Provoked seizures (HCC) 03/31/2023   Respiratory depression 03/31/2023   Status epilepticus (HCC) 03/31/2023   Migraine with aura and without status migrainosus, not intractable 10/03/2022   Pain in right knee 09/07/2022   Bilateral primary osteoarthritis of knee 09/07/2022   Chronic migraine  without aura without status migrainosus, not intractable 07/07/2022   Graves disease 05/10/2022   Sebaceous cyst 12/06/2021   Pain in left knee 11/03/2021   Hyperthyroidism 08/13/2021   Bipolar 1 disorder (HCC) 05/07/2021   Obesity (BMI 30.0-34.9) 05/07/2021   Hyperlipidemia 08/12/2020   Prediabetes 08/12/2020   Hypomagnesemia 08/12/2020   Mutation in BRIP1 gene 06/24/2020   Hypokalemia 06/17/2020   Dehydration    Colitis 06/16/2020   Port-A-Cath in place 03/11/2020   Malignant neoplasm of upper-inner quadrant of left breast in female, estrogen receptor positive (HCC) 02/25/2020   Vulvar cancer (HCC) 01/29/2020   Vulvar ulceration 01/23/2020   Essential hypertension 10/31/2019   Nausea and vomiting 12/27/2018   Seasonal allergic rhinitis 12/27/2018   History of ELISA positive for HSV 10/01/2018   Restless leg syndrome 04/01/2016   Nephrolithiasis 03/08/2016   Obstructive pyelonephritis 03/08/2016   Normocytic anemia 03/08/2016   Hidradenitis suppurativa of left axilla 11/03/2015   Loss of weight 09/08/2015   Neck pain on left side 10/22/2014   Genital warts 05/20/2014   GERD (gastroesophageal reflux disease) 12/26/2012   Functional dyspepsia 12/26/2012   Tobacco abuse 08/22/2012   Herpes simplex virus (HSV) infection 02/03/2007     Current Outpatient Medications on File Prior to Visit  Medication Sig Dispense Refill   diclofenac Sodium (VOLTAREN) 1 % GEL Apply 4 grams topically 4  times daily as needed. (Patient taking differently: Apply 4 g topically 4 (four) times daily as needed (pain).) 500 g 6   famotidine (PEPCID) 40 MG tablet Take 1 tablet (40 mg total) by mouth daily. 90 tablet 3   FLUoxetine (PROZAC) 10 MG capsule Take 3 capsules (30 mg total) by mouth daily. 90 capsule 1   gabapentin (NEURONTIN) 300 MG capsule Take 1 capsule (300 mg) by mouth daily. 30 capsule 1   lamoTRIgine (LAMICTAL) 25 MG tablet Then take 1 tablet in the morning and  4 tablets at bedtime x1  week starting October 4th THEN take 2 tablets in morning and 4 tablets at bedtime x1 week THEN take 3 tablets in morning and 4 tablets at bedtime x1 week THEN take 4 tablets in morning and 4 tablets at bedtime. 240 tablet 4   letrozole (FEMARA) 2.5 MG tablet Take 1 tablet by mouth daily. 90 tablet 3   levETIRAcetam (KEPPRA) 750 MG tablet Take 1 tablet (750 mg total) by mouth 2 (two) times daily. 180 tablet 3   levETIRAcetam (KEPPRA) 750 MG tablet Take 1 tablet (750 mg total) by mouth 2 (two) times daily. Taper off according to instructions given once you have reached goal dose of Lamotrigine. 60 tablet 0   lisinopril (ZESTRIL) 10 MG tablet Take 1 tablet (10 mg total) by mouth daily. Stop the Lisinopril/HCTZ combination 90 tablet 1   loratadine (CLARITIN REDITABS) 10 MG dissolvable tablet Take 10 mg by mouth daily. OTC     LORazepam (ATIVAN) 1 MG tablet Take 1 tablet (1 mg total) by mouth 3 (three) times daily as needed for anxiety 90 tablet 1   Magnesium 400 MG TABS Take 800 mg by mouth 2 (two) times daily.  120 tablet 1   methimazole (TAPAZOLE) 5 MG tablet Take one tablet (5 mg dose) by mouth daily. (Patient taking differently: Take 5 mg by mouth daily.) 90 tablet 3   Multiple Vitamin (MULTIVITAMIN WITH MINERALS) TABS tablet Take 1 tablet by mouth daily.     Multiple Vitamins-Minerals (CERTA-VITE PO) Take by mouth daily. OTC     omeprazole (PRILOSEC) 40 MG capsule Take 1 capsule (40 mg total) by mouth daily before breakfast. 90 capsule 3   potassium chloride SA (KLOR-CON M) 20 MEQ tablet Take 1 tablet (20 mEq) by mouth daily. 90 tablet 1   promethazine (PHENERGAN) 12.5 MG tablet Take 1 tablet (12.5 mg total) by mouth every 8 (eight) hours as needed for nausea or vomiting. 30 tablet 1   propranolol (INDERAL) 10 MG tablet Take 1 tablet by mouth 2 times daily. 180 tablet 0   spironolactone (ALDACTONE) 25 MG tablet Take 0.5 tablets (12.5 mg total) by mouth daily. 60 tablet 1   No current  facility-administered medications on file prior to visit.    Allergies  Allergen Reactions   Dilaudid [Hydromorphone Hcl] Other (See Comments)    Pt became confused, pulled out iv's, does not remember anything   Aspirin Hives    States able to tolerate Goody Powders and Ibuprofen without any problem    Depakote [Divalproex Sodium] Nausea And Vomiting   Minocycline Hives    Social History   Socioeconomic History   Marital status: Single    Spouse name: Not on file   Number of children: Not on file   Years of education: Not on file   Highest education level: Not on file  Occupational History   Not on file  Tobacco Use   Smoking status: Former    Current packs/day: 0.50    Types: Cigarettes   Smokeless tobacco: Never   Tobacco comments:    Recently started a smoking cessation class. Quit 03/31/23  Vaping Use   Vaping status: Never Used  Substance and Sexual Activity   Alcohol use: Not Currently   Drug use: Not Currently    Types: Marijuana    Comment: none in about 1 year   Sexual activity: Yes    Partners: Male    Birth control/protection: Surgical  Other Topics Concern   Not on file  Social History Narrative   Former [facility] patient.      Was on disability at one point.   Return to the workforce.  40 hours a week at [employer], 10 hours a week on the weekends at [employer] in [place], [employer] at night 10 to 12 hours a week.      Has grown children, she lives alone with a pet, continues to smoke no alcohol or drug use at this time      History of EtOH abuse and THC use.         Social Determinants of Health   Financial Resource Strain: Medium Risk (01/24/2022)   Received from [facility], [facility]   Overall Financial Resource Strain (CARDIA)    Difficulty of Paying Living Expenses: Somewhat hard  Food Insecurity: No Food Insecurity (04/10/2023)   Hunger Vital Sign    Worried About Running Out of Food in the Last Year: Never true    Ran  Out of Food in the Last Year: Never true  Transportation Needs: No Transportation Needs (04/10/2023)   PRAPARE - Administrator, Civil Service (Medical): No    Lack  of Transportation (Non-Medical): No  Physical Activity: Inactive (01/24/2022)   Received from [facility], [facility]   Exercise Vital Sign    Days of Exercise per Week: 0 days    Minutes of Exercise per Session: 0 min  Stress: Stress Concern Present (01/24/2022)   Received from [facility], [facility] - Occupational Stress Questionnaire    Feeling of Stress : Very much  Social Connections: Unknown (02/12/2023)   Received from [facility], [facility]   Social Network    Social Network: Not on file  Intimate Partner Violence: Not At Risk (04/10/2023)   Humiliation, Afraid, Rape, and Kick questionnaire    Fear of Current or Ex-Partner: No    Emotionally Abused: No    Physically Abused: No    Sexually Abused: No    Family History  Problem Relation Age of Onset   Heart disease Father    Lung cancer Father        d. 25   Alcohol abuse Father    Heart disease Mother    Depression Mother    Anxiety disorder Mother    Drug abuse Brother    Alcohol abuse Brother    Drug abuse Brother    ADD / ADHD Brother    Colon polyps Brother    Cancer Paternal Grandfather        "stomach"   Diabetes Maternal Grandfather    Diabetes Paternal Grandmother    Kidney disease Maternal Uncle    Cirrhosis Cousin        alcoholic   Anxiety disorder Maternal Aunt    Depression Maternal Aunt    Cancer Cousin        maternal; ovarian cancer or other "female" cancer?   Lung cancer Paternal Uncle 42   Throat cancer Cousin        paternal; dx 23s   Lung cancer Cousin        paternal; dx 63s    Past Surgical History:  Procedure Laterality Date   CESAREAN SECTION  1995   w/  Bilateral Tubal Ligation   COLONOSCOPY  last one 08-09-2013   CYSTOSCOPY W/ URETERAL STENT  PLACEMENT Left 03/29/2016   Procedure: CYSTOSCOPY WITH STENT REPLACEMENT;  Surgeon: Hildred Laser, MD;  Location: [facility];  Service: Urology;  Laterality: Left;   CYSTOSCOPY WITH RETROGRADE PYELOGRAM, URETEROSCOPY AND STENT PLACEMENT Left 03/08/2016   Procedure: CYSTOSCOPY WITH  LEFT RETROGRADE PYELOGRAM, AND STENT PLACEMENT;  Surgeon: Hildred Laser, MD;  Location: [facility];  Service: Urology;  Laterality: Left;   CYSTOSCOPY/RETROGRADE/URETEROSCOPY/STONE EXTRACTION WITH BASKET Left 03/29/2016   Procedure: CYSTOSCOPY/RETROGRADE/URETEROSCOPY/STONE EXTRACTION WITH BASKET;  Surgeon: Hildred Laser, MD;  Location: [facility];  Service: Urology;  Laterality: Left;   ENDOMETRIAL ABLATION W/ NOVASURE  04-01-2010   ESOPHAGOGASTRODUODENOSCOPY  last one 08-09-2013   KNEE ARTHROSCOPY Left as teen   LASER ABLATION OF THE CERVIX  2012 approx   MASTECTOMY WITH AXILLARY LYMPH NODE DISSECTION Left 07/27/2020   Procedure: LEFT MASTECTOMY WITH LEFT RADIOACTIVE SEED GUIDED TARGETED AXILLARY LYMPH NODE DISSECTION;  Surgeon: Ovidio Kin, MD;  Location: [facility];  Service: General;  Laterality: Left;   PORT-A-CATH REMOVAL Right 05/18/2021   Procedure: REMOVAL PORT-A-CATH;  Surgeon: Manus Rudd, MD;  Location: [facility];  Service: General;  Laterality: Right;   PORTACATH PLACEMENT Right 03/06/2020   Procedure: INSERTION PORT-A-CATH WITH ULTRASOUND GUIDANCE;  Surgeon: Ovidio Kin, MD;  Location: [facility];  Service: General;  Laterality: Right;   ROBOTIC ASSISTED TOTAL HYSTERECTOMY WITH BILATERAL SALPINGO OOPHERECTOMY N/A 05/18/2021   Procedure: XI ROBOTIC ASSISTED TOTAL HYSTERECTOMY WITH BILATERAL SALPINGO OOPHORECTOMY;  Surgeon: Adolphus Birchwood, MD;  Location: [facility];  Service: Gynecology;  Laterality: N/A;   TRANSTHORACIC ECHOCARDIOGRAM  05-19-2006   lvsf normal, ef 55-65%, there was mild flattening of the interventricular septum during diastoli/  RV size at upper  limits normal   TUBAL LIGATION     VULVA /PERINEUM BIOPSY N/A 05/18/2021   Procedure: VULVAR BIOPSY;  Surgeon: Adolphus Birchwood, MD;  Location: [facility];  Service: Gynecology;  Laterality: N/A;   WISDOM TOOTH EXTRACTION  age 89 's    ROS: Review of Systems Negative except as stated above  PHYSICAL EXAM: BP 119/86 (BP Location: Right Arm, Patient Position: Sitting, Cuff Size: Normal)   Pulse 88   Temp 99.4 F (37.4 C) (Oral)   Ht 5\' 7"  (1.702 m)   Wt 186 lb (84.4 kg)   SpO2 98%   BMI 29.13 kg/m   Wt Readings from Last 3 Encounters:  06/06/23 186 lb (84.4 kg)  06/02/23 182 lb 6.4 oz (82.7 kg)  05/29/23 178 lb (80.7 kg)    Physical Exam      General appearance - alert, well appearing, and in no distress Mental status - normal mood, behavior, speech, dress, motor activity, and thought processes Neck - supple, no significant adenopathy Chest - clear to auscultation, no wheezes, rales or rhonchi, symmetric air entry Heart - normal rate, regular rhythm, normal S1, S2, no murmurs, rubs, clicks or gallops Neurological -patient ambulating without assistive device.  Gait is slow but stable.  Grip 4/5 bilaterally.  Power proximally and distally in the upper and lower extremities 5/5 bilaterally. Hair: Hair is natural.  She has some thinning on the crown.     Latest Ref Rng & Units 06/02/2023    2:55 PM 04/21/2023    2:00 PM 04/14/2023   12:07 PM  CMP  Glucose 70 - 99 mg/dL 130   90   BUN 6 - 23 mg/dL 15   7   Creatinine 8.65 - 1.20 mg/dL 7.84   6.96   Sodium 295 - 145 mEq/L 135   140   Potassium 3.5 - 5.1 mEq/L 4.3   4.4   Chloride 96 - 112 mEq/L 100   100   CO2 19 - 32 mEq/L 26   19   Calcium 8.4 - 10.5 mg/dL 28.4  13.2  44.0   Total Protein 6.0 - 8.3 g/dL 8.0     Total Bilirubin 0.2 - 1.2 mg/dL 0.5     Alkaline Phos 39 - 117 U/L 56     AST 0 - 37 U/L 16     ALT 0 - 35 U/L 10      Lipid Panel     Component Value Date/Time   CHOL 159 08/18/2022 1605   TRIG 120 08/18/2022 1605    HDL 51 08/18/2022 1605   CHOLHDL 3.1 08/18/2022 1605   CHOLHDL 4.7 Ratio 06/13/2008 2303   VLDL 32 06/13/2008 2303   LDLCALC 87 08/18/2022 1605    CBC    Component Value Date/Time   WBC 8.8 06/02/2023 1455   RBC 4.53 06/02/2023 1455   HGB 13.1 06/02/2023 1455   HGB 12.9 11/22/2022 1633   HCT 40.5 06/02/2023 1455   HCT 38.6 11/22/2022 1633   PLT 339.0 06/02/2023 1455   PLT 318 11/22/2022 1633  MCV 89.5 06/02/2023 1455   MCV 89 11/22/2022 1633   MCH 30.2 04/12/2023 0046   MCHC 32.2 06/02/2023 1455   RDW 13.7 06/02/2023 1455   RDW 13.6 11/22/2022 1633   LYMPHSABS 3.1 06/02/2023 1455   LYMPHSABS 3.9 (H) 11/22/2022 1633   MONOABS 0.6 06/02/2023 1455   EOSABS 0.1 06/02/2023 1455   EOSABS 0.1 11/22/2022 1633   BASOSABS 0.1 06/02/2023 1455   BASOSABS 0.0 11/22/2022 1633    ASSESSMENT AND PLAN: 1. Gait disturbance 2. Weakness Improved but still not quite back to her baseline.  We agreed to keep her out of work until November 1 to give her more time to work with the physical therapist.  3. Impingement of left shoulder Seen by orthopedics.  Currently undergoing physical therapy.  4. Hair loss Lamictal was recently added by psychiatry.  According to up-to-date, it can cause some alopecia.  Advised to discuss with psychiatrist to see if there may be other option.  Will refer to dermatology - Ambulatory referral to Dermatology  5. Hypokalemia Last level of potassium check was good.  She will continue potassium supplement and spironolactone.  She has been seen by nephrology.  6. Provoked seizures (HCC) Being weaned off Keppra.  Plan is to use Lamictal for both mood disorder and seizure  7. Hypomagnesemia - Magnesium  8. Need for influenza vaccination Hold off on giving this today as patient temperature is low-grade.  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       There are no diagnoses linked to this  encounter.   Patient was given the opportunity to ask questions.  Patient verbalized understanding of the plan and was able to repeat key elements of the plan.   This documentation was completed using Paediatric nurse.  Any transcriptional errors are unintentional.  Orders Placed This Encounter  Procedures   Magnesium   Ambulatory referral to Dermatology     Requested Prescriptions    No prescriptions requested or ordered in this encounter    Return in about 3 months (around 09/06/2023).  Jonah Blue, MD, FACP

## 2023-06-06 NOTE — Patient Instructions (Signed)
We will keep you out of work until November 1. Continue to work with physical therapy.   We have referred you to a dermatologist for your hair loss.  You should also speak with Dr. Lolly Mustache about whether the Lamictal may be contributing to the hair loss.

## 2023-06-07 ENCOUNTER — Other Ambulatory Visit (HOSPITAL_COMMUNITY): Payer: Self-pay

## 2023-06-07 ENCOUNTER — Ambulatory Visit: Payer: No Typology Code available for payment source | Attending: Internal Medicine

## 2023-06-07 ENCOUNTER — Other Ambulatory Visit: Payer: Self-pay

## 2023-06-07 DIAGNOSIS — R2689 Other abnormalities of gait and mobility: Secondary | ICD-10-CM | POA: Diagnosis present

## 2023-06-07 DIAGNOSIS — M6281 Muscle weakness (generalized): Secondary | ICD-10-CM | POA: Diagnosis present

## 2023-06-07 DIAGNOSIS — M25512 Pain in left shoulder: Secondary | ICD-10-CM | POA: Diagnosis present

## 2023-06-07 DIAGNOSIS — R2681 Unsteadiness on feet: Secondary | ICD-10-CM

## 2023-06-07 LAB — MAGNESIUM: Magnesium: 2 mg/dL (ref 1.6–2.3)

## 2023-06-07 NOTE — Therapy (Signed)
OUTPATIENT PHYSICAL THERAPY TREATMENT NOTE  Patient Name: Sandra Miller MRN: [id number] DOB:07/20/1976, 47 y.o., female Today's Date: 06/07/2023  END OF SESSION:  PT End of Session - 06/07/23 1528     Visit Number 8    Number of Visits 13    Date for PT Re-Evaluation 06/26/23    Authorization Type [employer]    Authorization - Visit Number 8    Authorization - Number of Visits 20    PT Start Time 1530    PT Stop Time 1610    PT Time Calculation (min) 40 min    Activity Tolerance Patient limited by fatigue    Behavior During Therapy WFL for tasks assessed/performed              Past Medical History:  Diagnosis Date   Arthritis    Bipolar 1 disorder (HCC)    Cancer (HCC)    vulva, and breast   Complication of anesthesia    wakes up during procedures   Depression    GAD (generalized anxiety disorder)    Genital HSV    currently per pt  no break out 03-22-2016    GERD (gastroesophageal reflux disease)    Graves disease    Hiatal hernia    History of cervical dysplasia    2012 laser ablation   History of esophageal dilatation    for dysphasia -- x2 dilated   History of gastric ulcer    History of Helicobacter pylori infection    remote hx   History of hidradenitis suppurativa    "gets all over body intermittantly"     History of hypertension    no issue since stopped drinking alcohol 2014   History of kidney stones    History of panic attacks    History of radiation therapy 03/16/2020-05/08/2020   vulva  Dr Antony Blackbird   History of radiation therapy 09/09/2020-10/23/2020   left chest wall/left SCV   Dr Antony Blackbird   Hypertension    Iron deficiency anemia    Left ureteral stone    OCD (obsessive compulsive disorder)    PONV (postoperative nausea and vomiting)    Pre-diabetes    PTSD (post-traumatic stress disorder)    Recovering alcoholic in remission (HCC)    since 2014   RLS (restless legs syndrome)    Smokers' cough (HCC)    Thyroid disease     Urgency of urination    Yeast infection involving the vagina and surrounding area    secondary to taking antibiotic   Past Surgical History:  Procedure Laterality Date   CESAREAN SECTION  1995   w/  Bilateral Tubal Ligation   COLONOSCOPY  last one 08-09-2013   CYSTOSCOPY W/ URETERAL STENT PLACEMENT Left 03/29/2016   Procedure: CYSTOSCOPY WITH STENT REPLACEMENT;  Surgeon: Hildred Laser, MD;  Location: [facility];  Service: Urology;  Laterality: Left;   CYSTOSCOPY WITH RETROGRADE PYELOGRAM, URETEROSCOPY AND STENT PLACEMENT Left 03/08/2016   Procedure: CYSTOSCOPY WITH  LEFT RETROGRADE PYELOGRAM, AND STENT PLACEMENT;  Surgeon: Hildred Laser, MD;  Location: [facility];  Service: Urology;  Laterality: Left;   CYSTOSCOPY/RETROGRADE/URETEROSCOPY/STONE EXTRACTION WITH BASKET Left 03/29/2016   Procedure: CYSTOSCOPY/RETROGRADE/URETEROSCOPY/STONE EXTRACTION WITH BASKET;  Surgeon: Hildred Laser, MD;  Location: [facility];  Service: Urology;  Laterality: Left;   ENDOMETRIAL ABLATION W/ NOVASURE  04-01-2010   ESOPHAGOGASTRODUODENOSCOPY  last one 08-09-2013   KNEE ARTHROSCOPY Left as teen   LASER ABLATION OF THE CERVIX  2012 approx   MASTECTOMY WITH AXILLARY LYMPH NODE DISSECTION Left 07/27/2020   Procedure: LEFT MASTECTOMY WITH LEFT RADIOACTIVE SEED GUIDED TARGETED AXILLARY LYMPH NODE DISSECTION;  Surgeon: Ovidio Kin, MD;  Location: [facility];  Service: General;  Laterality: Left;   PORT-A-CATH REMOVAL Right 05/18/2021   Procedure: REMOVAL PORT-A-CATH;  Surgeon: Manus Rudd, MD;  Location: [facility];  Service: General;  Laterality: Right;   PORTACATH PLACEMENT Right 03/06/2020   Procedure: INSERTION PORT-A-CATH WITH ULTRASOUND GUIDANCE;  Surgeon: Ovidio Kin, MD;  Location: [facility];  Service: General;  Laterality: Right;   ROBOTIC ASSISTED TOTAL HYSTERECTOMY WITH BILATERAL SALPINGO OOPHERECTOMY N/A 05/18/2021   Procedure: XI ROBOTIC ASSISTED TOTAL  HYSTERECTOMY WITH BILATERAL SALPINGO OOPHORECTOMY;  Surgeon: Adolphus Birchwood, MD;  Location: [facility];  Service: Gynecology;  Laterality: N/A;   TRANSTHORACIC ECHOCARDIOGRAM  05-19-2006   lvsf normal, ef 55-65%, there was mild flattening of the interventricular septum during diastoli/  RV size at upper limits normal   TUBAL LIGATION     VULVA /PERINEUM BIOPSY N/A 05/18/2021   Procedure: VULVAR BIOPSY;  Surgeon: Adolphus Birchwood, MD;  Location: [facility];  Service: Gynecology;  Laterality: N/A;   WISDOM TOOTH EXTRACTION  age 88 's   Patient Active Problem List   Diagnosis Date Noted   Mood disorder (HCC) 05/29/2023   Fatigue 05/15/2023   Prolonged QT interval 04/10/2023   Dizziness 04/10/2023   Provoked seizures (HCC) 03/31/2023   Respiratory depression 03/31/2023   Status epilepticus (HCC) 03/31/2023   Migraine with aura and without status migrainosus, not intractable 10/03/2022   Pain in right knee 09/07/2022   Bilateral primary osteoarthritis of knee 09/07/2022   Chronic migraine without aura without status migrainosus, not intractable 07/07/2022   Graves disease 05/10/2022   Sebaceous cyst 12/06/2021   Pain in left knee 11/03/2021   Hyperthyroidism 08/13/2021   Bipolar 1 disorder (HCC) 05/07/2021   Obesity (BMI 30.0-34.9) 05/07/2021   Hyperlipidemia 08/12/2020   Prediabetes 08/12/2020   Hypomagnesemia 08/12/2020   Mutation in BRIP1 gene 06/24/2020   Hypokalemia 06/17/2020   Dehydration    Colitis 06/16/2020   Port-A-Cath in place 03/11/2020   Malignant neoplasm of upper-inner quadrant of left breast in female, estrogen receptor positive (HCC) 02/25/2020   Vulvar cancer (HCC) 01/29/2020   Vulvar ulceration 01/23/2020   Essential hypertension 10/31/2019   Nausea and vomiting 12/27/2018   Seasonal allergic rhinitis 12/27/2018   History of ELISA positive for HSV 10/01/2018   Restless leg syndrome 04/01/2016   Nephrolithiasis 03/08/2016   Obstructive pyelonephritis 03/08/2016   Normocytic  anemia 03/08/2016   Hidradenitis suppurativa of left axilla 11/03/2015   Loss of weight 09/08/2015   Neck pain on left side 10/22/2014   Genital warts 05/20/2014   GERD (gastroesophageal reflux disease) 12/26/2012   Functional dyspepsia 12/26/2012   Tobacco abuse 08/22/2012   Herpes simplex virus (HSV) infection 02/03/2007    PCP: Marcine Matar, MD  REFERRING PROVIDER: Marcine Matar, MD 05/11/23 shoulder referral - Madelyn Brunner, DO  REFERRING DIAG: Weakness [R53.1], Gait disturbance [R26.9]  05/11/23 referral: M75.42 (ICD-10-CM) - Impingement syndrome of left shoulder M25.512,G89.29 (ICD-10-CM) - Chronic left shoulder pain M89.8X1,G89.29 (ICD-10-CM) - Chronic scapular pain  THERAPY DIAG:  Muscle weakness (generalized)  Other abnormalities of gait and mobility  Unsteadiness on feet  Left shoulder pain, unspecified chronicity  Rationale for Evaluation and Treatment: Rehabilitation  ONSET DATE: 03/31/23  SUBJECTIVE:   SUBJECTIVE STATEMENT: Patient continues to report difficulty with walking distances, including being  limited with ability to walk through the entire grocery store. She was seen by her PCP yesterday, and states that she extended her return to work until 07/07/23.    PERTINENT HISTORY: NO BP OR IV IN LEFT UE d/t Lymphedema   PMHx includes Arthritis, Bipolar 1, Vulva and Breast Cancer (currently in remission), Depression, GAD, Graves Disease, Anemia, OCD, pre-diabetes, PTSD, restless leg syndrome, seizure, chronic migraine    PAIN:  Are you having pain? Yes, L shoulder pain 8/10  Pain best/worst: 8-10/10 Aggravating factors: sitting/standing unsupported Easing factors: having arm supported, medication patches  PRECAUTIONS: Other: Lymphedema Alert - NO BP on LEFT ARM    WEIGHT BEARING RESTRICTIONS: No  FALLS:  Has patient fallen in last 6 months?  "Not that I recall"  LIVING ENVIRONMENT: Lives with: lives with their family Lives in:  House/apartment Stairs: No Has following equipment at home: Walker - 4 wheeled, bed side commode, and Grab bars  OCCUPATION: CNA  PLOF: Independent  PATIENT GOALS: "I want to be able to go back to work."   NEXT MD VISIT: 04/26/23 Neurologist, 05/02/23 Orthopedic   OBJECTIVE: (objective measures completed at initial evaluation unless otherwise dated)   DIAGNOSTIC FINDINGS:   04/05/23: HIP UNILAT W or WO Pelvis 2-3 views IMPRESSION: Mild-to-moderate right and mild left femoroacetabular osteoarthritis.  PATIENT SURVEYS:  FOTO 39 current, 56 predicted   COGNITION: Overall cognitive status: Impaired - related to recent changes with memory as noted in subjective assessment      SENSATION: Not tested  POSTURE: rounded shoulders and posterior pelvic tilt   UPPER EXTREMITY ROM:  A/PROM Right 05/15/23 Left 05/15/23  Shoulder flexion 158 deg 122 deg *  Shoulder abduction    Shoulder internal rotation    Shoulder external rotation (functional) C6 C5  Elbow flexion WNL WNL  Elbow extension WNL WNL  Wrist flexion    Wrist extension     (Blank rows = not tested) (Key: WFL = within functional limits not formally assessed, * = concordant pain, s = stiffness/stretching sensation, NT = not tested)  Comments:    LOWER EXTREMITY MMT:  MMT Right eval Left eval  Hip flexion 3+ 3+  Hip extension 4- 4-  Hip abduction 4- 4-  Hip adduction    Hip internal rotation 3+ 4-  Hip external rotation 3+ 4-  Knee flexion 3+ 4-  Knee extension 3+ 3+  Ankle dorsiflexion 4+ 4+  Ankle plantarflexion    Ankle inversion    Ankle eversion     (Blank rows = not tested)  UPPER EXTREMITY MMT:  MMT Right 05/15/23 Left 05/15/23  Shoulder flexion 4 4 s  Shoulder extension    Shoulder abduction 4+ 4+  Shoulder extension    Shoulder internal rotation 4 4-  Shoulder external rotation 4 4  Elbow flexion    Elbow extension    Grip strength    (Blank rows = not tested)  (Key: WFL = within functional  limits not formally assessed, * = concordant pain, s = stiffness/stretching sensation, NT = not tested)  Comments:    FUNCTIONAL TESTS:  Timed up and go (TUG): TBD  Balance    Romberg EO on floor  30 sec, Min sway  Romberg EC on floor 30 sec, min sway  Romberg EO on airex 30 sec, mod sway  Romberg EC on airex  30 sec, mod sway     Balance Right Left   Tandem on floor 30 sec 30 sec  SLS on floor  11 sec 7 sec    GAIT: Distance walked: 50 feet Assistive device utilized: None Level of assistance: SBA Comments: slow gait speed - BIL small step length with decreased stance time    TODAY'S TREATMENT  [facility] Adult PT Treatment:                                                DATE: 06/07/2023  Therapeutic Exercise: Nustep level 4 x LE only  Red band row 2 x10 standing on airex  Red band shoulder ext 2 x10 standing on airex Red band ER/IR 2 x10 each cues for setup  Standing heel raises x10 Standing toe raises x10 Eyes closed static stance 30sec CGA Head nods/turns x 30 sec each   Therapeutic Activity: - 581 feet, required 2 min seated rest break after 1.5 min of walking.  Review current progress with PT and plan for continuation until end of POC   [facility] Adult PT Treatment:                                                DATE: 05/30/23 Therapeutic Exercise: Nustep level 4 x 7 mins (250 steps) LE only Red band row x10 cues for reduced elbow compensations Red band shoulder ext x10 cues for posture Red band ER/IR x10 each cues for setup  Standing heel raises x10 Standing toe raises x10 Eyes closed static stance 30sec CGA Eyes open/closed on airex static stance 2x30sec each CGA for safety, counter to assist with getting onto airex Standing march 2x30" BIL no UE support, CGA for safety and emphasis on slow pacing, upright posture Tandem stance on ground x30" BIL, on airex x30" BIL   [facility] Adult PT Treatment:                                                DATE:  05/24/23 Therapeutic Exercise: Nustep level 4 x 6 mins (200 steps) LE only Red band row x10 cues for reduced elbow compensations Red band shoulder ext x10 cues for posture Red band ER/IR x10 each cues for setup  Standing heel raises x10 Standing toe raises x10 Eyes closed static stance 30sec CGA Eyes open/closed on airex static stance 2x30sec each CGA for safety, counter to assist with getting onto airex Standing march 108 BIL no UE support, CGA for safety and emphasis on slow pacing, upright posture Tandem stance on ground x30" BIL   [facility] Adult PT Treatment:                                                DATE: 05/22/23 Therapeutic Exercise: Red band row x10 cues for reduced elbow compensations Red band shoulder ext x10 cues for posture Red band ER/IR x10 each cues for setup  Eyes closed static stance 30sec CGA Eyes open/closed on airex static stance 2x30sec each CGA for safety, ski pole to assist with getting onto airex Standing march x8 BIL no UE support, CGA  for safety and emphasis on slow pacing, upright posture                                                                                                                   PATIENT EDUCATION:  Education details: rationale for interventions, PT exam findings and POC  Person educated: Patient Education method: Medical illustrator Education comprehension: verbalized understanding, returned demonstration, and needs further education  HOME EXERCISE PROGRAM: Access Code: [email or web address] URL: [email or web address] Date: 05/15/2023 Prepared by: Fransisco Hertz  Exercises - Sit to Stand with Armchair  - 2-3 x daily - 7 x weekly - 1 sets - 5 reps - Heel Raises with Counter Support  - 2-3 x daily - 7 x weekly - 1 sets - 8 reps - Seated Scapular Retraction  - 2-3 x daily - 7 x weekly - 1 sets - 8 reps  ASSESSMENT:  CLINICAL IMPRESSION:  Shannie continues to be limited by fatigue, but was able to tolerate some  progression of exercises including increase in volume and resistance. Performed 6 minute walk test at end of session, she complete 581 ft and required a 2 minute seated break after the first 1.5 minutes of walking. However, this result is limited d/t prior performance of standing exercises. She continue to demonstrate need for UE and LE strengthening. Plan is to continue with skilled PT 2x/week through remaining POC.    OBJECTIVE IMPAIRMENTS: Abnormal gait, decreased activity tolerance, decreased balance, decreased endurance, decreased mobility, difficulty walking, decreased strength, impaired perceived functional ability, improper body mechanics, postural dysfunction, and pain.   ACTIVITY LIMITATIONS: carrying, lifting, standing, squatting, stairs, transfers, bed mobility, locomotion level, and caring for others  PARTICIPATION LIMITATIONS: cleaning, community activity, and occupation  PERSONAL FACTORS: Past/current experiences, Time since onset of injury/illness/exacerbation, and 3+ comorbidities: PMHx includes Arthritis, Bipolar 1, Vulva and Breast Cancer (currently in remission), Depression, GAD, Graves Disease, Anemia, OCD, pre-diabetes, PTSD, restless leg syndrome, seizure, chronic migraine   are also affecting patient's functional outcome.   REHAB POTENTIAL: Fair    CLINICAL DECISION MAKING: Evolving/moderate complexity  EVALUATION COMPLEXITY: Moderate   GOALS: Goals reviewed with patient? Yes  SHORT TERM GOALS: Target date: 05/16/2023   Patient will be independent with initial home program for LE strengthening and balance . Baseline: to be initiated at follow up visit  05/15/23: HEP established Goal status: PROGRESSING  2.  Patient will ambulate 6 minutes with LRAD without LOB.  Baseline: requires RW, unsteady with walking >50-160ft 05/15/23: ambulates without device within clinic, no instability or LOB noted Goal status: ONGOING   LONG TERM GOALS: Target date: 06/26/2023 (updated  05/15/23 given updated referral to include shoulder) Patient will report improved overall functional ability with FOTO score of 56 predicted.  Baseline: 39 current Goal status: INITIAL  2.  Patient will perform SLS at least 20 sec on each LE in order to maximize safe and independent function.  Baseline:  Balance Right Left   Tandem on floor 30 sec 30 sec  SLS on floor  11 sec 7 sec   Goal status: INITIAL  3.  Patient will demonstrate at least 4 or 4+/5 MMT with all LE strength testing.   Baseline:  MMT Right eval Left eval  Hip flexion 3+ 3+  Hip extension 4- 4-  Hip abduction 4- 4-  Hip adduction    Hip internal rotation 3+ 4-  Hip external rotation 3+ 4-  Knee flexion 3+ 4-  Knee extension 3+ 3+  Ankle dorsiflexion 4+ 4+   Goal status: INITIAL  4.  Patient will demonstrate ability to walk 6+ minutes at normal gait speed without AD or LOB.  Baseline: requires RW  Goal status: INITIAL  5.  Patient will demonstrate ability to ambulate over varied surfaces, including inclines without LOB and no more than minimal fatigue after 15 minutes of weight bearing activities.  Baseline: unable  Goal status: INITIAL  6. Pt will demonstrate at least 145 deg L shoulder elevation AROM in order to facilitate improved tolerance to daily tasks.  Baseline: see ROM chart above  Goal status: NEW 05/15/23  7. Pt will report at least 50% decrease in overall pain levels in past week in order to facilitate improved tolerance to basic ADLs/mobility.   Baseline: 7-10/10  Goal status: NEW 05/15/23     PLAN: updated 05/15/23 to include shoulder POC  PT FREQUENCY: 1-2x/week    PT DURATION: 6 weeks  PLANNED INTERVENTIONS: Therapeutic exercises, Therapeutic activity, Neuromuscular re-education, Balance training, Gait training, Patient/Family education, Self Care, Joint mobilization, Electrical stimulation, Cryotherapy, Moist heat, Manual therapy, and Re-evaluation  PLAN FOR NEXT SESSION: balance  (narrow BOS, SLS, uneven surfaces), gait activities encouraging increased SLS time, reactive balance, BIL LE strengthening. Postural mobility and GH mobility within pt tolerance, would likely benefit from gradual IR strengthening and scapular work. HEP update    Mauri Reading, PT, DPT  06/07/2023 4:35 PM

## 2023-06-08 ENCOUNTER — Telehealth (HOSPITAL_COMMUNITY): Payer: No Typology Code available for payment source | Admitting: Psychiatry

## 2023-06-08 ENCOUNTER — Other Ambulatory Visit (HOSPITAL_COMMUNITY): Payer: Self-pay

## 2023-06-08 ENCOUNTER — Inpatient Hospital Stay: Payer: No Typology Code available for payment source | Attending: Hematology and Oncology

## 2023-06-08 ENCOUNTER — Other Ambulatory Visit: Payer: Self-pay

## 2023-06-08 ENCOUNTER — Encounter (HOSPITAL_COMMUNITY): Payer: Self-pay

## 2023-06-08 VITALS — BP 131/99 | HR 72 | Temp 98.8°F | Resp 16

## 2023-06-08 DIAGNOSIS — C50212 Malignant neoplasm of upper-inner quadrant of left female breast: Secondary | ICD-10-CM | POA: Insufficient documentation

## 2023-06-08 DIAGNOSIS — T733XXA Exhaustion due to excessive exertion, initial encounter: Secondary | ICD-10-CM

## 2023-06-08 DIAGNOSIS — R5383 Other fatigue: Secondary | ICD-10-CM | POA: Diagnosis present

## 2023-06-08 MED ORDER — CYANOCOBALAMIN 1000 MCG/ML IJ SOLN
1000.0000 ug | Freq: Once | INTRAMUSCULAR | Status: AC
  Administered 2023-06-08: 1000 ug via INTRAMUSCULAR

## 2023-06-09 ENCOUNTER — Encounter: Payer: Self-pay | Admitting: Hematology and Oncology

## 2023-06-10 ENCOUNTER — Encounter: Payer: Self-pay | Admitting: Hematology and Oncology

## 2023-06-11 ENCOUNTER — Other Ambulatory Visit: Payer: Self-pay | Admitting: Internal Medicine

## 2023-06-11 DIAGNOSIS — I10 Essential (primary) hypertension: Secondary | ICD-10-CM

## 2023-06-12 ENCOUNTER — Other Ambulatory Visit (HOSPITAL_COMMUNITY): Payer: Self-pay

## 2023-06-12 ENCOUNTER — Other Ambulatory Visit: Payer: Self-pay

## 2023-06-12 MED ORDER — LISINOPRIL 10 MG PO TABS
10.0000 mg | ORAL_TABLET | Freq: Every day | ORAL | 0 refills | Status: DC
  Filled 2023-06-12: qty 30, 30d supply, fill #0
  Filled 2023-07-13: qty 30, 30d supply, fill #1
  Filled 2023-08-10: qty 30, 30d supply, fill #2

## 2023-06-12 NOTE — Telephone Encounter (Signed)
Requested Prescriptions  Pending Prescriptions Disp Refills   lisinopril (ZESTRIL) 10 MG tablet 90 tablet 0    Sig: Take 1 tablet (10 mg total) by mouth daily. Stop the Lisinopril/HCTZ combination     Cardiovascular:  ACE Inhibitors Failed - 06/11/2023  3:08 PM      Failed - Last BP in normal range    BP Readings from Last 1 Encounters:  06/08/23 (!) 131/99         Passed - Cr in normal range and within 180 days    Creatinine  Date Value Ref Range Status  04/06/2021 0.75 0.44 - 1.00 mg/dL Final   Creat  Date Value Ref Range Status  11/10/2016 1.01 0.50 - 1.10 mg/dL Final   Creatinine, Ser  Date Value Ref Range Status  06/02/2023 0.78 0.40 - 1.20 mg/dL Final   Creatinine,U  Date Value Ref Range Status  12/02/2009 46.6 mg/dL Final    Comment:    See lab report for associated comment(s)         Passed - K in normal range and within 180 days    Potassium  Date Value Ref Range Status  06/02/2023 4.3 3.5 - 5.1 mEq/L Final         Passed - Patient is not pregnant      Passed - Valid encounter within last 6 months    Recent Outpatient Visits           6 days ago Gait disturbance   [facility] Marcine Matar, MD   1 month ago Hospital discharge follow-up   [facility] Marcine Matar, MD   6 months ago Essential hypertension   [facility] Marcine Matar, MD   6 months ago Gastroenteritis   [facility] Hoy Register, MD   9 months ago Annual physical exam   [facility] Marcine Matar, MD       Future Appointments             In 3 weeks Little Ishikawa, MD [facility]   In 3 months Laural Benes, Binnie Rail, MD [facility]   In 3 months Butch Penny, NP [facility]

## 2023-06-13 ENCOUNTER — Other Ambulatory Visit: Payer: Self-pay | Admitting: Neurology

## 2023-06-13 ENCOUNTER — Other Ambulatory Visit (HOSPITAL_COMMUNITY): Payer: Self-pay

## 2023-06-13 DIAGNOSIS — R569 Unspecified convulsions: Secondary | ICD-10-CM

## 2023-06-13 MED ORDER — LAMOTRIGINE ER 100 MG PO TB24
100.0000 mg | ORAL_TABLET | Freq: Every day | ORAL | 6 refills | Status: DC
  Filled 2023-06-13: qty 30, 30d supply, fill #0
  Filled 2023-07-10: qty 30, 30d supply, fill #1
  Filled 2023-08-09: qty 30, 30d supply, fill #2

## 2023-06-14 ENCOUNTER — Other Ambulatory Visit (HOSPITAL_COMMUNITY): Payer: Self-pay

## 2023-06-15 ENCOUNTER — Other Ambulatory Visit (HOSPITAL_COMMUNITY): Payer: Self-pay

## 2023-06-15 ENCOUNTER — Ambulatory Visit: Payer: No Typology Code available for payment source

## 2023-06-15 DIAGNOSIS — R2681 Unsteadiness on feet: Secondary | ICD-10-CM

## 2023-06-15 DIAGNOSIS — M6281 Muscle weakness (generalized): Secondary | ICD-10-CM | POA: Diagnosis not present

## 2023-06-15 DIAGNOSIS — R2689 Other abnormalities of gait and mobility: Secondary | ICD-10-CM

## 2023-06-15 DIAGNOSIS — M25512 Pain in left shoulder: Secondary | ICD-10-CM

## 2023-06-15 NOTE — Therapy (Signed)
OUTPATIENT PHYSICAL THERAPY TREATMENT NOTE  Patient Name: Sandra Miller MRN: [id number] DOB:08/15/1976, 47 y.o., female Today's Date: 06/15/2023  END OF SESSION:  PT End of Session - 06/15/23 1728     Visit Number 9    Number of Visits 13    Date for PT Re-Evaluation 06/26/23    Authorization Type [employer]    Authorization - Visit Number 9    Authorization - Number of Visits 20    PT Start Time 1743    PT Stop Time 1810   Pt requested to end session early d/t fatigue   PT Time Calculation (min) 27 min    Activity Tolerance Patient limited by fatigue    Behavior During Therapy WFL for tasks assessed/performed              Past Medical History:  Diagnosis Date   Arthritis    Bipolar 1 disorder (HCC)    Cancer (HCC)    vulva, and breast   Complication of anesthesia    wakes up during procedures   Depression    GAD (generalized anxiety disorder)    Genital HSV    currently per pt  no break out 03-22-2016    GERD (gastroesophageal reflux disease)    Graves disease    Hiatal hernia    History of cervical dysplasia    2012 laser ablation   History of esophageal dilatation    for dysphasia -- x2 dilated   History of gastric ulcer    History of Helicobacter pylori infection    remote hx   History of hidradenitis suppurativa    "gets all over body intermittantly"     History of hypertension    no issue since stopped drinking alcohol 2014   History of kidney stones    History of panic attacks    History of radiation therapy 03/16/2020-05/08/2020   vulva  Dr Antony Blackbird   History of radiation therapy 09/09/2020-10/23/2020   left chest wall/left SCV   Dr Antony Blackbird   Hypertension    Iron deficiency anemia    Left ureteral stone    OCD (obsessive compulsive disorder)    PONV (postoperative nausea and vomiting)    Pre-diabetes    PTSD (post-traumatic stress disorder)    Recovering alcoholic in remission (HCC)    since 2014   RLS (restless legs syndrome)     Smokers' cough (HCC)    Thyroid disease    Urgency of urination    Yeast infection involving the vagina and surrounding area    secondary to taking antibiotic   Past Surgical History:  Procedure Laterality Date   CESAREAN SECTION  1995   w/  Bilateral Tubal Ligation   COLONOSCOPY  last one 08-09-2013   CYSTOSCOPY W/ URETERAL STENT PLACEMENT Left 03/29/2016   Procedure: CYSTOSCOPY WITH STENT REPLACEMENT;  Surgeon: Hildred Laser, MD;  Location: [facility];  Service: Urology;  Laterality: Left;   CYSTOSCOPY WITH RETROGRADE PYELOGRAM, URETEROSCOPY AND STENT PLACEMENT Left 03/08/2016   Procedure: CYSTOSCOPY WITH  LEFT RETROGRADE PYELOGRAM, AND STENT PLACEMENT;  Surgeon: Hildred Laser, MD;  Location: [facility];  Service: Urology;  Laterality: Left;   CYSTOSCOPY/RETROGRADE/URETEROSCOPY/STONE EXTRACTION WITH BASKET Left 03/29/2016   Procedure: CYSTOSCOPY/RETROGRADE/URETEROSCOPY/STONE EXTRACTION WITH BASKET;  Surgeon: Hildred Laser, MD;  Location: [facility];  Service: Urology;  Laterality: Left;   ENDOMETRIAL ABLATION W/ NOVASURE  04-01-2010   ESOPHAGOGASTRODUODENOSCOPY  last one 08-09-2013   KNEE ARTHROSCOPY Left as  teen   LASER ABLATION OF THE CERVIX  2012 approx   MASTECTOMY WITH AXILLARY LYMPH NODE DISSECTION Left 07/27/2020   Procedure: LEFT MASTECTOMY WITH LEFT RADIOACTIVE SEED GUIDED TARGETED AXILLARY LYMPH NODE DISSECTION;  Surgeon: Ovidio Kin, MD;  Location: [facility];  Service: General;  Laterality: Left;   PORT-A-CATH REMOVAL Right 05/18/2021   Procedure: REMOVAL PORT-A-CATH;  Surgeon: Manus Rudd, MD;  Location: [facility];  Service: General;  Laterality: Right;   PORTACATH PLACEMENT Right 03/06/2020   Procedure: INSERTION PORT-A-CATH WITH ULTRASOUND GUIDANCE;  Surgeon: Ovidio Kin, MD;  Location: [facility];  Service: General;  Laterality: Right;   ROBOTIC ASSISTED TOTAL HYSTERECTOMY WITH BILATERAL SALPINGO OOPHERECTOMY N/A 05/18/2021    Procedure: XI ROBOTIC ASSISTED TOTAL HYSTERECTOMY WITH BILATERAL SALPINGO OOPHORECTOMY;  Surgeon: Adolphus Birchwood, MD;  Location: [facility];  Service: Gynecology;  Laterality: N/A;   TRANSTHORACIC ECHOCARDIOGRAM  05-19-2006   lvsf normal, ef 55-65%, there was mild flattening of the interventricular septum during diastoli/  RV size at upper limits normal   TUBAL LIGATION     VULVA /PERINEUM BIOPSY N/A 05/18/2021   Procedure: VULVAR BIOPSY;  Surgeon: Adolphus Birchwood, MD;  Location: [facility];  Service: Gynecology;  Laterality: N/A;   WISDOM TOOTH EXTRACTION  age 59 's   Patient Active Problem List   Diagnosis Date Noted   Mood disorder (HCC) 05/29/2023   Fatigue 05/15/2023   Prolonged QT interval 04/10/2023   Dizziness 04/10/2023   Provoked seizures (HCC) 03/31/2023   Respiratory depression 03/31/2023   Status epilepticus (HCC) 03/31/2023   Migraine with aura and without status migrainosus, not intractable 10/03/2022   Pain in right knee 09/07/2022   Bilateral primary osteoarthritis of knee 09/07/2022   Chronic migraine without aura without status migrainosus, not intractable 07/07/2022   Graves disease 05/10/2022   Sebaceous cyst 12/06/2021   Pain in left knee 11/03/2021   Hyperthyroidism 08/13/2021   Bipolar 1 disorder (HCC) 05/07/2021   Obesity (BMI 30.0-34.9) 05/07/2021   Hyperlipidemia 08/12/2020   Prediabetes 08/12/2020   Hypomagnesemia 08/12/2020   Mutation in BRIP1 gene 06/24/2020   Hypokalemia 06/17/2020   Dehydration    Colitis 06/16/2020   Port-A-Cath in place 03/11/2020   Malignant neoplasm of upper-inner quadrant of left breast in female, estrogen receptor positive (HCC) 02/25/2020   Vulvar cancer (HCC) 01/29/2020   Vulvar ulceration 01/23/2020   Essential hypertension 10/31/2019   Nausea and vomiting 12/27/2018   Seasonal allergic rhinitis 12/27/2018   History of ELISA positive for HSV 10/01/2018   Restless leg syndrome 04/01/2016   Nephrolithiasis 03/08/2016   Obstructive  pyelonephritis 03/08/2016   Normocytic anemia 03/08/2016   Hidradenitis suppurativa of left axilla 11/03/2015   Loss of weight 09/08/2015   Neck pain on left side 10/22/2014   Genital warts 05/20/2014   GERD (gastroesophageal reflux disease) 12/26/2012   Functional dyspepsia 12/26/2012   Tobacco abuse 08/22/2012   Herpes simplex virus (HSV) infection 02/03/2007    PCP: Marcine Matar, MD  REFERRING PROVIDER: Marcine Matar, MD 05/11/23 shoulder referral - Madelyn Brunner, DO  REFERRING DIAG: Weakness [R53.1], Gait disturbance [R26.9]  05/11/23 referral: M75.42 (ICD-10-CM) - Impingement syndrome of left shoulder M25.512,G89.29 (ICD-10-CM) - Chronic left shoulder pain M89.8X1,G89.29 (ICD-10-CM) - Chronic scapular pain  THERAPY DIAG:  Muscle weakness (generalized)  Other abnormalities of gait and mobility  Unsteadiness on feet  Left shoulder pain, unspecified chronicity  Rationale for Evaluation and Treatment: Rehabilitation  ONSET DATE: 03/31/23  SUBJECTIVE:   SUBJECTIVE STATEMENT: Patient  reports continued high levels of Lt shoulder pain. She does state that she feels like her balance is improving.   PERTINENT HISTORY: NO BP OR IV IN LEFT UE d/t Lymphedema   PMHx includes Arthritis, Bipolar 1, Vulva and Breast Cancer (currently in remission), Depression, GAD, Graves Disease, Anemia, OCD, pre-diabetes, PTSD, restless leg syndrome, seizure, chronic migraine    PAIN:  Are you having pain? Yes, L shoulder pain 8/10  Pain best/worst: 8-10/10 Aggravating factors: sitting/standing unsupported Easing factors: having arm supported, medication patches  PRECAUTIONS: Other: Lymphedema Alert - NO BP on LEFT ARM    WEIGHT BEARING RESTRICTIONS: No  FALLS:  Has patient fallen in last 6 months?  "Not that I recall"  LIVING ENVIRONMENT: Lives with: lives with their family Lives in: House/apartment Stairs: No Has following equipment at home: Walker - 4 wheeled, bed side  commode, and Grab bars  OCCUPATION: CNA  PLOF: Independent  PATIENT GOALS: "I want to be able to go back to work."   NEXT MD VISIT: 04/26/23 Neurologist, 05/02/23 Orthopedic   OBJECTIVE: (objective measures completed at initial evaluation unless otherwise dated)   DIAGNOSTIC FINDINGS:   04/05/23: HIP UNILAT W or WO Pelvis 2-3 views IMPRESSION: Mild-to-moderate right and mild left femoroacetabular osteoarthritis.  PATIENT SURVEYS:  FOTO 39 current, 56 predicted   COGNITION: Overall cognitive status: Impaired - related to recent changes with memory as noted in subjective assessment      SENSATION: Not tested  POSTURE: rounded shoulders and posterior pelvic tilt   UPPER EXTREMITY ROM:  A/PROM Right 05/15/23 Left 05/15/23  Shoulder flexion 158 deg 122 deg *  Shoulder abduction    Shoulder internal rotation    Shoulder external rotation (functional) C6 C5  Elbow flexion WNL WNL  Elbow extension WNL WNL  Wrist flexion    Wrist extension     (Blank rows = not tested) (Key: WFL = within functional limits not formally assessed, * = concordant pain, s = stiffness/stretching sensation, NT = not tested)  Comments:    LOWER EXTREMITY MMT:  MMT Right eval Left eval  Hip flexion 3+ 3+  Hip extension 4- 4-  Hip abduction 4- 4-  Hip adduction    Hip internal rotation 3+ 4-  Hip external rotation 3+ 4-  Knee flexion 3+ 4-  Knee extension 3+ 3+  Ankle dorsiflexion 4+ 4+  Ankle plantarflexion    Ankle inversion    Ankle eversion     (Blank rows = not tested)  UPPER EXTREMITY MMT:  MMT Right 05/15/23 Left 05/15/23  Shoulder flexion 4 4 s  Shoulder extension    Shoulder abduction 4+ 4+  Shoulder extension    Shoulder internal rotation 4 4-  Shoulder external rotation 4 4  Elbow flexion    Elbow extension    Grip strength    (Blank rows = not tested)  (Key: WFL = within functional limits not formally assessed, * = concordant pain, s = stiffness/stretching sensation, NT  = not tested)  Comments:    FUNCTIONAL TESTS:  Timed up and go (TUG): TBD  Balance    Romberg EO on floor  30 sec, Min sway  Romberg EC on floor 30 sec, min sway  Romberg EO on airex 30 sec, mod sway  Romberg EC on airex  30 sec, mod sway     Balance Right Left   Tandem on floor 30 sec 30 sec  SLS on floor  11 sec 7 sec    GAIT: Distance  walked: 50 feet Assistive device utilized: None Level of assistance: SBA Comments: slow gait speed - BIL small step length with decreased stance time    TODAY'S TREATMENT [facility] Adult PT Treatment:                                                DATE: 06/15/23 Therapeutic Exercise: Nustep level 4 x LE only  Red band row 2 x10 standing on airex  Red band shoulder ext 2 x10 standing on airex Red band ER/IR 2 x10 each cues for setup  Standing heel raises 2x10 Standing toe raises 2x10 FT on Airex Eyes closed static stance 30sec CGA Head nods/turns x 30 sec each  Marching on Airex 2x30"   [facility] Adult PT Treatment:                                                DATE: 06/07/2023  Therapeutic Exercise: Nustep level 4 x LE only  Red band row 2 x10 standing on airex  Red band shoulder ext 2 x10 standing on airex Red band ER/IR 2 x10 each cues for setup  Standing heel raises x10 Standing toe raises x10 Eyes closed static stance 30sec CGA Head nods/turns x 30 sec each   Therapeutic Activity: - 581 feet, required 2 min seated rest break after 1.5 min of walking.  Review current progress with PT and plan for continuation until end of POC   [facility] Adult PT Treatment:                                                DATE: 05/30/23 Therapeutic Exercise: Nustep level 4 x 7 mins (250 steps) LE only Red band row x10 cues for reduced elbow compensations Red band shoulder ext x10 cues for posture Red band ER/IR x10 each cues for setup  Standing heel raises x10 Standing toe raises x10 Eyes closed static stance 30sec CGA Eyes  open/closed on airex static stance 2x30sec each CGA for safety, counter to assist with getting onto airex Standing march 2x30" BIL no UE support, CGA for safety and emphasis on slow pacing, upright posture Tandem stance on ground x30" BIL, on airex x30" BIL                                                                                                                    PATIENT EDUCATION:  Education details: rationale for interventions, PT exam findings and POC  Person educated: Patient Education method: Medical illustrator Education comprehension: verbalized understanding, returned demonstration, and needs further education  HOME EXERCISE PROGRAM: Access Code:  [email or web address] URL: [email or web address] Date: 05/15/2023 Prepared by: Fransisco Hertz  Exercises - Sit to Stand with Armchair  - 2-3 x daily - 7 x weekly - 1 sets - 5 reps - Heel Raises with Counter Support  - 2-3 x daily - 7 x weekly - 1 sets - 8 reps - Seated Scapular Retraction  - 2-3 x daily - 7 x weekly - 1 sets - 8 reps  ASSESSMENT:  CLINICAL IMPRESSION: Patient presents to PT with continued reports of high levels of shoulder pain as well as continued debilitating fatigue. She states that the Vitamin B12 injections have not made an impressionable difference in her energy levels. Continued to work on shoulder strengthening and balance tasks. She requested to end session early today due to increased fatigue at session at end of the day. Patient continues to benefit from skilled PT services and should be progressed as able to improve functional independence.     OBJECTIVE IMPAIRMENTS: Abnormal gait, decreased activity tolerance, decreased balance, decreased endurance, decreased mobility, difficulty walking, decreased strength, impaired perceived functional ability, improper body mechanics, postural dysfunction, and pain.   ACTIVITY LIMITATIONS: carrying, lifting, standing, squatting, stairs, transfers, bed  mobility, locomotion level, and caring for others  PARTICIPATION LIMITATIONS: cleaning, community activity, and occupation  PERSONAL FACTORS: Past/current experiences, Time since onset of injury/illness/exacerbation, and 3+ comorbidities: PMHx includes Arthritis, Bipolar 1, Vulva and Breast Cancer (currently in remission), Depression, GAD, Graves Disease, Anemia, OCD, pre-diabetes, PTSD, restless leg syndrome, seizure, chronic migraine   are also affecting patient's functional outcome.   REHAB POTENTIAL: Fair    CLINICAL DECISION MAKING: Evolving/moderate complexity  EVALUATION COMPLEXITY: Moderate   GOALS: Goals reviewed with patient? Yes  SHORT TERM GOALS: Target date: 05/16/2023   Patient will be independent with initial home program for LE strengthening and balance . Baseline: to be initiated at follow up visit  05/15/23: HEP established Goal status: PROGRESSING  2.  Patient will ambulate 6 minutes with LRAD without LOB.  Baseline: requires RW, unsteady with walking >50-112ft 05/15/23: ambulates without device within clinic, no instability or LOB noted Goal status: ONGOING   LONG TERM GOALS: Target date: 06/26/2023 (updated 05/15/23 given updated referral to include shoulder) Patient will report improved overall functional ability with FOTO score of 56 predicted.  Baseline: 39 current Goal status: INITIAL  2.  Patient will perform SLS at least 20 sec on each LE in order to maximize safe and independent function.  Baseline:  Balance Right Left   Tandem on floor 30 sec 30 sec  SLS on floor  11 sec 7 sec   Goal status: INITIAL  3.  Patient will demonstrate at least 4 or 4+/5 MMT with all LE strength testing.   Baseline:  MMT Right eval Left eval  Hip flexion 3+ 3+  Hip extension 4- 4-  Hip abduction 4- 4-  Hip adduction    Hip internal rotation 3+ 4-  Hip external rotation 3+ 4-  Knee flexion 3+ 4-  Knee extension 3+ 3+  Ankle dorsiflexion 4+ 4+   Goal status:  INITIAL  4.  Patient will demonstrate ability to walk 6+ minutes at normal gait speed without AD or LOB.  Baseline: requires RW  Goal status: INITIAL  5.  Patient will demonstrate ability to ambulate over varied surfaces, including inclines without LOB and no more than minimal fatigue after 15 minutes of weight bearing activities.  Baseline: unable  Goal status: INITIAL  6. Pt will demonstrate at least 145 deg L  shoulder elevation AROM in order to facilitate improved tolerance to daily tasks.  Baseline: see ROM chart above  Goal status: NEW 05/15/23  7. Pt will report at least 50% decrease in overall pain levels in past week in order to facilitate improved tolerance to basic ADLs/mobility.   Baseline: 7-10/10  Goal status: NEW 05/15/23     PLAN: updated 05/15/23 to include shoulder POC  PT FREQUENCY: 1-2x/week    PT DURATION: 6 weeks  PLANNED INTERVENTIONS: Therapeutic exercises, Therapeutic activity, Neuromuscular re-education, Balance training, Gait training, Patient/Family education, Self Care, Joint mobilization, Electrical stimulation, Cryotherapy, Moist heat, Manual therapy, and Re-evaluation  PLAN FOR NEXT SESSION: balance (narrow BOS, SLS, uneven surfaces), gait activities encouraging increased SLS time, reactive balance, BIL LE strengthening. Postural mobility and GH mobility within pt tolerance, would likely benefit from gradual IR strengthening and scapular work. HEP update    Berta Minor PTA 06/15/2023 6:14 PM

## 2023-06-16 ENCOUNTER — Other Ambulatory Visit (HOSPITAL_COMMUNITY): Payer: Self-pay

## 2023-06-16 ENCOUNTER — Telehealth: Payer: Self-pay | Admitting: Physician Assistant

## 2023-06-16 NOTE — Telephone Encounter (Signed)
Negative GI pathogen panel and Cdif. No infection seen in your stool.

## 2023-06-18 ENCOUNTER — Encounter: Payer: Self-pay | Admitting: Hematology and Oncology

## 2023-06-18 ENCOUNTER — Ambulatory Visit (HOSPITAL_COMMUNITY): Payer: No Typology Code available for payment source

## 2023-06-19 ENCOUNTER — Encounter: Payer: Self-pay | Admitting: Hematology and Oncology

## 2023-06-19 ENCOUNTER — Other Ambulatory Visit: Payer: Self-pay

## 2023-06-19 NOTE — Telephone Encounter (Signed)
error 

## 2023-06-20 ENCOUNTER — Ambulatory Visit: Payer: No Typology Code available for payment source | Attending: Internal Medicine

## 2023-06-20 ENCOUNTER — Encounter: Payer: Self-pay | Admitting: Hematology and Oncology

## 2023-06-20 ENCOUNTER — Other Ambulatory Visit (HOSPITAL_COMMUNITY): Payer: Self-pay

## 2023-06-20 DIAGNOSIS — Z23 Encounter for immunization: Secondary | ICD-10-CM | POA: Diagnosis not present

## 2023-06-20 NOTE — Telephone Encounter (Signed)
TC

## 2023-06-21 NOTE — Progress Notes (Signed)
Pt given flu vaccine in right deltoid

## 2023-06-22 ENCOUNTER — Telehealth (HOSPITAL_COMMUNITY): Payer: No Typology Code available for payment source | Admitting: Psychiatry

## 2023-06-22 ENCOUNTER — Ambulatory Visit: Payer: No Typology Code available for payment source

## 2023-06-22 ENCOUNTER — Other Ambulatory Visit (HOSPITAL_COMMUNITY): Payer: Self-pay

## 2023-06-22 ENCOUNTER — Other Ambulatory Visit: Payer: Self-pay

## 2023-06-22 ENCOUNTER — Encounter (HOSPITAL_COMMUNITY): Payer: Self-pay | Admitting: Psychiatry

## 2023-06-22 VITALS — Wt 186.0 lb

## 2023-06-22 DIAGNOSIS — F319 Bipolar disorder, unspecified: Secondary | ICD-10-CM

## 2023-06-22 DIAGNOSIS — F411 Generalized anxiety disorder: Secondary | ICD-10-CM

## 2023-06-22 MED ORDER — LORAZEPAM 1 MG PO TABS
1.0000 mg | ORAL_TABLET | Freq: Three times a day (TID) | ORAL | 2 refills | Status: DC | PRN
Start: 2023-06-22 — End: 2023-09-22
  Filled 2023-06-22 – 2023-07-12 (×2): qty 90, 30d supply, fill #0
  Filled 2023-08-06 – 2023-08-09 (×2): qty 90, 30d supply, fill #1
  Filled 2023-09-11 – 2023-09-13 (×3): qty 90, 30d supply, fill #2

## 2023-06-22 MED ORDER — FLUOXETINE HCL 10 MG PO CAPS
30.0000 mg | ORAL_CAPSULE | Freq: Every day | ORAL | 2 refills | Status: DC
  Filled 2023-06-22 – 2023-07-15 (×2): qty 90, 30d supply, fill #0
  Filled 2023-08-14: qty 90, 30d supply, fill #1
  Filled 2023-09-13 (×2): qty 90, 30d supply, fill #2

## 2023-06-22 NOTE — Progress Notes (Signed)
[facility] MD Virtual Progress Note   Patient Location: Home Provider Location: Office  I connect with patient by video and verified that I am speaking with correct person by using two identifiers. I discussed the limitations of evaluation and management by telemedicine and the availability of in person appointments. I also discussed with the patient that there may be a patient responsible charge related to this service. The patient expressed understanding and agreed to proceed.  Sandra Miller [id number] 47 y.o.  06/22/2023 10:28 AM  History of Present Illness:  Patient is evaluated by video session.  She reported things are somewhat better but still has not started work because her doctor recommended not to work for at least 1 more month.  She admitted fatigue, tired, not able to walk for a while.  She does go to grocery store but not able to walk after a few minutes.  She gets tired.  She started physical therapy and hoping that will help her.  She is not taking Lamictal XR 100 mg daily as could not tolerate regular Lamictal 100.  She has no rash.  She was having itching with the instant release Lamictal.  She is compliant with Ativan and Prozac.  Her mother and daughter helps for the doctor's appointment.  She reported no more seizures since the last time and hoping that she able to drive in near future.  She admitted some anxiety and concern about her job as not able to go back to work for the past 3 months.  She also understands that she may need to do only part time in the future.  She is applying for Medicaid and recently approved.  She denies any mania, psychosis, hallucination.  She admitted sometimes frustrated and irritable and angry to the situation but denies any thoughts of taking her own life or someone else.  She is a good support from her family.  She is still on Keppra and her neurologist trying to wean her off from Keppra and building the Lamictal dose.  Her recent  magnesium is normal.  Past Psychiatric History: H/O bipolar disorder, alcohol dependence and marijuana abuse.  H/O at least 6 inpatient. Tried lithium, Seroquel, Lamictal, Paxil, Celexa, Abilify, Geodon, Klonopin, Depakote, Neurontin and Cymbalta.  No H/o history of suicidal attempt.  Olanzapine helped but it was discontinued in July 2024 due to abnormal EKG.    Outpatient Encounter Medications as of 06/22/2023  Medication Sig   diclofenac Sodium (VOLTAREN) 1 % GEL Apply 4 grams topically 4  times daily as needed. (Patient taking differently: Apply 4 g topically 4 (four) times daily as needed (pain).)   famotidine (PEPCID) 40 MG tablet Take 1 tablet (40 mg total) by mouth daily.   FLUoxetine (PROZAC) 10 MG capsule Take 3 capsules (30 mg total) by mouth daily.   gabapentin (NEURONTIN) 300 MG capsule Take 1 capsule (300 mg) by mouth daily.   lamoTRIgine (LAMICTAL XR) 100 MG 24 hour tablet Take 1 tablet (100 mg total) by mouth at bedtime.   letrozole (FEMARA) 2.5 MG tablet Take 1 tablet by mouth daily.   levETIRAcetam (KEPPRA) 750 MG tablet Take 1 tablet (750 mg total) by mouth 2 (two) times daily.   levETIRAcetam (KEPPRA) 750 MG tablet Take 1 tablet (750 mg total) by mouth 2 (two) times daily. Taper off according to instructions given once you have reached goal dose of Lamotrigine.   lisinopril (ZESTRIL) 10 MG tablet Take 1 tablet (10 mg total) by mouth daily.  Stop the Lisinopril/HCTZ combination   loratadine (CLARITIN REDITABS) 10 MG dissolvable tablet Take 10 mg by mouth daily. OTC   LORazepam (ATIVAN) 1 MG tablet Take 1 tablet (1 mg total) by mouth 3 (three) times daily as needed for anxiety   Magnesium 400 MG TABS Take 800 mg by mouth 2 (two) times daily.   methimazole (TAPAZOLE) 5 MG tablet Take one tablet (5 mg dose) by mouth daily. (Patient taking differently: Take 5 mg by mouth daily.)   Multiple Vitamin (MULTIVITAMIN WITH MINERALS) TABS tablet Take 1 tablet by mouth daily.   Multiple  Vitamins-Minerals (CERTA-VITE PO) Take by mouth daily. OTC   omeprazole (PRILOSEC) 40 MG capsule Take 1 capsule (40 mg total) by mouth daily before breakfast.   potassium chloride SA (KLOR-CON M) 20 MEQ tablet Take 1 tablet (20 mEq) by mouth daily.   promethazine (PHENERGAN) 12.5 MG tablet Take 1 tablet (12.5 mg total) by mouth every 8 (eight) hours as needed for nausea or vomiting.   propranolol (INDERAL) 10 MG tablet Take 1 tablet by mouth 2 times daily.   spironolactone (ALDACTONE) 25 MG tablet Take 0.5 tablets (12.5 mg total) by mouth daily.   No facility-administered encounter medications on file as of 06/22/2023.    Recent Results (from the past 2160 hour(s))  CBG monitoring, ED     Status: Abnormal   Collection Time: 03/31/23 10:54 AM  Result Value Ref Range   Glucose-Capillary 304 (H) 70 - 99 mg/dL    Comment: Glucose reference range applies only to samples taken after fasting for at least 8 hours.  Ethanol     Status: None   Collection Time: 03/31/23 10:58 AM  Result Value Ref Range   Alcohol, Ethyl (B) <10 <10 mg/dL    Comment: (NOTE) Lowest detectable limit for serum alcohol is 10 mg/dL.  For medical purposes only. Performed at [facility] Lab, [address] [place], [place] [address]   CBC with Differential/Platelet     Status: Abnormal   Collection Time: 03/31/23 11:03 AM  Result Value Ref Range   WBC 25.8 (H) 4.0 - 10.5 K/uL   RBC 5.04 3.87 - 5.11 MIL/uL   Hemoglobin 15.1 (H) 12.0 - 15.0 g/dL   HCT 13.0 (H) 86.5 - 78.4 %   MCV 97.2 80.0 - 100.0 fL   MCH 30.0 26.0 - 34.0 pg   MCHC 30.8 30.0 - 36.0 g/dL   RDW 69.6 29.5 - 28.4 %   Platelets 455 (H) 150 - 400 K/uL   nRBC 0.0 0.0 - 0.2 %   Neutrophils Relative % 84 %   Neutro Abs 21.5 (H) 1.7 - 7.7 K/uL   Lymphocytes Relative 11 %   Lymphs Abs 2.9 0.7 - 4.0 K/uL   Monocytes Relative 4 %   Monocytes Absolute 1.1 (H) 0.1 - 1.0 K/uL   Eosinophils Relative 0 %   Eosinophils Absolute 0.0 0.0 - 0.5 K/uL    Basophils Relative 0 %   Basophils Absolute 0.1 0.0 - 0.1 K/uL   Immature Granulocytes 1 %   Abs Immature Granulocytes 0.20 (H) 0.00 - 0.07 K/uL    Comment: Performed at [facility], [address], [place], [place] [address]  TSH     Status: Abnormal   Collection Time: 03/31/23 11:06 AM  Result Value Ref Range   TSH 4.711 (H) 0.350 - 4.500 uIU/mL    Comment: Performed by a 3rd Generation assay with a functional sensitivity of <=0.01 uIU/mL. Performed  at [facility], [address], [place], [place] [address]   Comprehensive metabolic panel     Status: Abnormal   Collection Time: 03/31/23 11:12 AM  Result Value Ref Range   Sodium 143 135 - 145 mmol/L   Potassium 3.4 (L) 3.5 - 5.1 mmol/L   Chloride 100 98 - 111 mmol/L   CO2 10 (L) 22 - 32 mmol/L   Glucose, Bld 316 (H) 70 - 99 mg/dL    Comment: Glucose reference range applies only to samples taken after fasting for at least 8 hours.   BUN 10 6 - 20 mg/dL   Creatinine, Ser 6.96 0.44 - 1.00 mg/dL   Calcium 29.5 (H) 8.9 - 10.3 mg/dL   Total Protein 8.3 (H) 6.5 - 8.1 g/dL   Albumin 4.7 3.5 - 5.0 g/dL   AST 47 (H) 15 - 41 U/L   ALT 32 0 - 44 U/L   Alkaline Phosphatase 56 38 - 126 U/L   Total Bilirubin 0.8 0.3 - 1.2 mg/dL   GFR, Estimated >28 >41 mL/min    Comment: (NOTE) Calculated using the CKD-EPI Creatinine Equation (2021)    Anion gap 33 (H) 5 - 15    Comment: ELECTROLYTES REPEATED TO VERIFY Performed at [facility], [address] [place], [place] [address]   Magnesium     Status: Abnormal   Collection Time: 03/31/23 11:12 AM  Result Value Ref Range   Magnesium 0.8 (LL) 1.7 - 2.4 mg/dL    Comment: CRITICAL RESULT CALLED TO, READ BACK BY AND VERIFIED WITH S,BERTRAND RN @1434  03/31/23 E,BENTON Performed at [facility], [address] [place], [place] [address]   Phosphorus     Status: Abnormal   Collection Time: 03/31/23 11:12 AM  Result Value Ref Range   Phosphorus 5.4 (H) 2.5 - 4.6 mg/dL     Comment: Performed at [facility], [address] [place], [place] [address]  I-STAT 7, (LYTES, BLD GAS, ICA, H+H)     Status: Abnormal   Collection Time: 03/31/23  5:04 PM  Result Value Ref Range   pH, Arterial 7.285 (L) 7.35 - 7.45   pCO2 arterial 49.0 (H) 32 - 48 mmHg   pO2, Arterial 264 (H) 83 - 108 mmHg   Bicarbonate 23.3 20.0 - 28.0 mmol/L   TCO2 25 22 - 32 mmol/L   O2 Saturation 100 %   Acid-base deficit 4.0 (H) 0.0 - 2.0 mmol/L   Sodium 140 135 - 145 mmol/L   Potassium <2.0 (LL) 3.5 - 5.1 mmol/L   Calcium, Ion 1.21 1.15 - 1.40 mmol/L   HCT 43.0 36.0 - 46.0 %   Hemoglobin 14.6 12.0 - 15.0 g/dL   Sample type ARTERIAL    Comment NOTIFIED PHYSICIAN   Glucose, capillary     Status: Abnormal   Collection Time: 03/31/23  5:31 PM  Result Value Ref Range   Glucose-Capillary 200 (H) 70 - 99 mg/dL    Comment: Glucose reference range applies only to samples taken after fasting for at least 8 hours.  MRSA Next Gen by PCR, Nasal     Status: None   Collection Time: 03/31/23  5:39 PM   Specimen: Nasal Mucosa; Nasal Swab  Result Value Ref Range   MRSA by PCR Next Gen NOT DETECTED NOT DETECTED    Comment: (NOTE) The GeneXpert MRSA Assay (FDA approved for NASAL specimens only), is one component of a comprehensive MRSA colonization surveillance program. It is  not intended to diagnose MRSA infection nor to guide or monitor treatment for MRSA infections. Test performance is not FDA approved in patients less than 67 years old. Performed at [facility], [address] [place], [place] [address]   Meningitis/Encephalitis Panel (CSF)     Status: None   Collection Time: 03/31/23  6:08 PM  Result Value Ref Range   Cryptococcus neoformans/gattii (CSF) NOT DETECTED NOT DETECTED    Comment: (NOTE) Patients with a suspicion of cryptococcal meningitis should be tested  for cryptococcal antigen (CrAg).      Cytomegalovirus (CSF) NOT DETECTED NOT DETECTED   Enterovirus (CSF) NOT  DETECTED NOT DETECTED   Escherichia coli K1 (CSF) NOT DETECTED NOT DETECTED    Comment: (NOTE) Only E. coli strains possessing the K1 capsular antigen will be detected.      Haemophilus influenzae (CSF) NOT DETECTED NOT DETECTED   Herpes simplex virus 1 (CSF) NOT DETECTED NOT DETECTED   Herpes simplex virus 2 (CSF) NOT DETECTED NOT DETECTED   Human herpesvirus 6 (CSF) NOT DETECTED NOT DETECTED   Human parechovirus (CSF) NOT DETECTED NOT DETECTED   Listeria monocytogenes (CSF) NOT DETECTED NOT DETECTED   Neisseria meningitis (CSF) NOT DETECTED NOT DETECTED    Comment: (NOTE) Only encapsulated strains of N. meningitidis will be detected.     Streptococcus agalactiae (CSF) NOT DETECTED NOT DETECTED   Streptococcus pneumoniae (CSF) NOT DETECTED NOT DETECTED   Varicella zoster virus (CSF) NOT DETECTED NOT DETECTED    Comment: Performed at [facility], [address] [place], [place] [address]  CSF cell count with differential collection tube #: 1     Status: Abnormal   Collection Time: 03/31/23  6:08 PM  Result Value Ref Range   Tube # 1    Color, CSF COLORLESS COLORLESS   Appearance, CSF CLEAR (A) CLEAR   Supernatant NOT INDICATED    RBC Count, CSF 140 (H) 0 /cu mm   WBC, CSF 1 0 - 5 /cu mm   Other Cells, CSF TOO FEW TO COUNT, SMEAR AVAILABLE FOR REVIEW     Comment: RARE LYMPHOCYTES, RARE NEUTROPHILS, AND RARE MONOCYTES/MACROPHAGES Performed at [facility], [address], [place], [place] [address]   CSF cell count with differential     Status: Abnormal   Collection Time: 03/31/23  6:08 PM  Result Value Ref Range   Tube # 4    Color, CSF COLORLESS COLORLESS   Appearance, CSF CLEAR (A) CLEAR   Supernatant NOT INDICATED    RBC Count, CSF 455 (H) 0 /cu mm   WBC, CSF 2 0 - 5 /cu mm   Other Cells, CSF TOO FEW TO COUNT, SMEAR AVAILABLE FOR REVIEW     Comment: FEW LYMPHOCYTES, FEW NEUTROPHILS, AND RARE MONOCYTES/MACROPHAGES Performed at [facility], [address] [place], [place] [address]   CSF culture w Gram Stain     Status: None   Collection Time: 03/31/23  6:08 PM   Specimen: CSF; Cerebrospinal Fluid  Result Value Ref Range   Specimen Description CSF    Special Requests LP    Gram Stain NO WBC SEEN NO ORGANISMS SEEN     Culture      NO GROWTH 3 DAYS Performed at [facility], [address] [place], [place] [address]    Report Status 04/03/2023 FINAL   Protein and glucose, CSF     Status: Abnormal   Collection Time: 03/31/23  6:08 PM  Result Value Ref Range   Glucose, CSF 132 (H) 40 - 70 mg/dL   Total  Protein, CSF 43 15 - 45 mg/dL    Comment: Performed at [facility], [address], [place], [place] [address]  Protime-INR     Status: Abnormal   Collection Time: 03/31/23  6:52 PM  Result Value Ref Range   Prothrombin Time >90.0 (H) 11.4 - 15.2 seconds    Comment: QUESTIONABLE RESULTS, RECOMMEND RECOLLECT TO VERIFY NOTIFIED A,WILSON RN @2048  03/31/23 E,BENTON CORRECTED ON 07/26 AT 2022: PREVIOUSLY REPORTED AS >90.0    INR >10.0 (HH) 0.8 - 1.2    Comment: QUESTIONABLE RESULTS, RECOMMEND RECOLLECT TO VERIFY NOTIFIED A,WILSON RN @2048  03/31/23 E,BENTON (NOTE) INR goal varies based on device and disease states. Performed at [facility], [address], [place], [place] [address] CORRECTED ON 07/26 AT 2110: PREVIOUSLY REPORTED AS >10.0 J. WILSON RN @2018  ON 03/31/23 BY MAB MT, CORRECTED ON 07/26 AT 2022: PREVIOUSLY REPORTED AS >10.0 CRITICAL RESULT CALLED TO, READ BACK BY AND VERIFIED WITH: REPEATED TO VERIFY Roslynn Amble RN @2018   ON 03/31/23 BY MAB MT   T4, free     Status: None   Collection Time: 03/31/23  6:52 PM  Result Value Ref Range   Free T4 1.11 0.61 - 1.12 ng/dL    Comment: NOTIFIED A,WILSON RN @2048  03/31/23 E,BENTON QUESTIONABLE RESULTS, RECOMMEND RECOLLECT TO VERIFY (NOTE) Biotin ingestion may interfere with free T4 tests. If the results are inconsistent with the TSH level, previous test  results, or the clinical presentation, then consider biotin interference. If needed, order repeat testing after stopping biotin. Performed at [facility], [address] [place], [place] [address] CORRECTED ON 07/26 AT 2109: PREVIOUSLY REPORTED AS 1.11 NOTIFIED A,WILSON RN @2048  03/31/23 E,BENTON   CK     Status: Abnormal   Collection Time: 03/31/23  6:52 PM  Result Value Ref Range   Total CK 2,723 (H) 38 - 234 U/L    Comment: QUESTIONABLE RESULTS, RECOMMEND RECOLLECT TO VERIFY NOTIFIED A,WILSON RN @2048  03/31/23 E,BENTON Performed at [facility], [address] [place], [place] [address]   Culture, blood (Routine X 2) w Reflex to ID Panel     Status: None   Collection Time: 03/31/23  6:52 PM   Specimen: BLOOD RIGHT ARM  Result Value Ref Range   Specimen Description BLOOD RIGHT ARM    Special Requests      BOTTLES DRAWN AEROBIC ONLY Blood Culture results may not be optimal due to an inadequate volume of blood received in culture bottles   Culture      NO GROWTH 5 DAYS Performed at [facility], [address] [place], [place] [address]    Report Status 04/05/2023 FINAL   Beta-hydroxybutyric acid     Status: None   Collection Time: 03/31/23  6:52 PM  Result Value Ref Range   Beta-Hydroxybutyric Acid 0.24 0.05 - 0.27 mmol/L    Comment: QUESTIONABLE RESULTS, RECOMMEND RECOLLECT TO VERIFY NOTIFIED A,WILSON RN @2048  03/31/23 E,BENTON Performed at [facility] Lab, [address], [place], [place] [address]   Culture, blood (Routine X 2) w Reflex to ID Panel     Status: None   Collection Time: 03/31/23  7:02 PM   Specimen: BLOOD RIGHT HAND  Result Value Ref Range   Specimen Description BLOOD RIGHT HAND    Special Requests      BOTTLES DRAWN AEROBIC  ONLY Blood Culture adequate volume   Culture      NO GROWTH 5 DAYS Performed at [facility], [address] [place], [place] [address]    Report Status 04/05/2023 FINAL   Glucose, capillary     Status:  Abnormal   Collection Time: 03/31/23  7:28 PM  Result Value Ref Range   Glucose-Capillary 157 (H) 70 - 99 mg/dL    Comment: Glucose reference range applies only to samples taken after fasting for at least 8 hours.  Protime-INR     Status: None   Collection Time: 03/31/23  9:47 PM  Result Value Ref Range   Prothrombin Time 14.6 11.4 - 15.2 seconds   INR 1.1 0.8 - 1.2    Comment: (NOTE) INR goal varies based on device and disease states. Performed at [facility], [address], [place], [place] [address]   APTT     Status: None   Collection Time: 03/31/23  9:47 PM  Result Value Ref Range   aPTT 33 24 - 36 seconds    Comment: Performed at [facility], [address] [place], [place] [address]  Hepatic function panel     Status: Abnormal   Collection Time: 03/31/23  9:47 PM  Result Value Ref Range   Total Protein 6.7 6.5 - 8.1 g/dL   Albumin 3.4 (L) 3.5 - 5.0 g/dL   AST 86 (H) 15 - 41 U/L   ALT 30 0 - 44 U/L   Alkaline Phosphatase 42 38 - 126 U/L   Total Bilirubin 0.6 0.3 - 1.2 mg/dL   Bilirubin, Direct 0.1 0.0 - 0.2 mg/dL   Indirect Bilirubin 0.5 0.3 - 0.9 mg/dL    Comment: Performed at [facility] Lab, [address], [place], [place] [address]  Magnesium     Status: None   Collection Time: 03/31/23  9:48 PM  Result Value Ref Range   Magnesium 2.1 1.7 - 2.4 mg/dL    Comment: Performed at [facility], [address] [place], [place] [address]  Comprehensive metabolic panel     Status: Abnormal   Collection Time: 03/31/23  9:48 PM  Result Value Ref Range   Sodium 142 135 - 145 mmol/L   Potassium 2.9 (L) 3.5 - 5.1 mmol/L   Chloride 103 98 - 111 mmol/L   CO2 22 22 - 32 mmol/L   Glucose, Bld 111 (H) 70 - 99 mg/dL    Comment: Glucose reference range applies only to samples taken after fasting for at least 8 hours.   BUN 12 6 - 20 mg/dL   Creatinine, Ser 1.30 0.44 - 1.00 mg/dL   Calcium 8.8 (L) 8.9 - 10.3 mg/dL   Total Protein 6.5 6.5 - 8.1 g/dL    Albumin 3.5 3.5 - 5.0 g/dL   AST 85 (H) 15 - 41 U/L   ALT 34 0 - 44 U/L   Alkaline Phosphatase 40 38 - 126 U/L   Total Bilirubin 1.1 0.3 - 1.2 mg/dL   GFR, Estimated >86 >57 mL/min    Comment: (NOTE) Calculated using the CKD-EPI Creatinine Equation (2021)    Anion gap 17 (H) 5 - 15    Comment: Performed at [facility], [address], [place], [place] [address]  CK total and CKMB (cardiac)not at [facility]     Status: Abnormal   Collection Time: 03/31/23  9:48 PM  Result Value Ref Range   Total CK 3,472 (H)  38 - 234 U/L   CK, MB 59.2 (H) 0.5 - 5.0 ng/mL    Comment: Performed at [facility], [address], [place], [place] [address]  Glucose, capillary     Status: Abnormal   Collection Time: 03/31/23 11:28 PM  Result Value Ref Range   Glucose-Capillary 119 (H) 70 - 99 mg/dL    Comment: Glucose reference range applies only to samples taken after fasting for at least 8 hours.  Blood gas, venous     Status: Abnormal   Collection Time: 04/01/23 12:24 AM  Result Value Ref Range   pH, Ven 7.39 7.25 - 7.43   pCO2, Ven 38 (L) 44 - 60 mmHg   pO2, Ven 100 (H) 32 - 45 mmHg   Bicarbonate 23.0 20.0 - 28.0 mmol/L   Acid-base deficit 1.7 0.0 - 2.0 mmol/L   O2 Saturation 100 %   Patient temperature 37.2    Collection site RT HAND    Drawn by [id number]     Comment: Performed at [facility], [address], [place], [place] [address]  Rapid urine drug screen (hospital performed)     Status: Abnormal   Collection Time: 04/01/23 12:47 AM  Result Value Ref Range   Opiates NONE DETECTED NONE DETECTED   Cocaine NONE DETECTED NONE DETECTED   Benzodiazepines POSITIVE (A) NONE DETECTED   Amphetamines NONE DETECTED NONE DETECTED   Tetrahydrocannabinol NONE DETECTED NONE DETECTED   Barbiturates NONE DETECTED NONE DETECTED    Comment: (NOTE) DRUG SCREEN FOR MEDICAL PURPOSES ONLY.  IF CONFIRMATION IS NEEDED FOR ANY PURPOSE, NOTIFY LAB WITHIN 5 DAYS.  LOWEST DETECTABLE LIMITS FOR URINE  DRUG SCREEN Drug Class                     Cutoff (ng/mL) Amphetamine and metabolites    1000 Barbiturate and metabolites    200 Benzodiazepine                 200 Opiates and metabolites        300 Cocaine and metabolites        300 THC                            50 Performed at [facility] Lab, [address], [place], [place] [address]   Urinalysis, w/ Reflex to Culture (Infection Suspected) -Urine, Clean Catch     Status: Abnormal   Collection Time: 04/01/23 12:47 AM  Result Value Ref Range   Specimen Source URINE, CLEAN CATCH    Color, Urine YELLOW YELLOW   APPearance HAZY (A) CLEAR   Specific Gravity, Urine 1.027 1.005 - 1.030   pH 5.0 5.0 - 8.0   Glucose, UA NEGATIVE NEGATIVE mg/dL   Hgb urine dipstick LARGE (A) NEGATIVE   Bilirubin Urine NEGATIVE NEGATIVE   Ketones, ur NEGATIVE NEGATIVE mg/dL   Protein, ur >=784 (A) NEGATIVE mg/dL   Nitrite NEGATIVE NEGATIVE   Leukocytes,Ua NEGATIVE NEGATIVE   RBC / HPF 0-5 0 - 5 RBC/hpf   WBC, UA 21-50 0 - 5 WBC/hpf    Comment:        Reflex urine culture not performed if WBC <=10, OR if Squamous epithelial cells >5. If Squamous epithelial cells >5 suggest recollection.    Bacteria, UA RARE (A) NONE SEEN   Squamous Epithelial / HPF 0-5 0 - 5 /HPF   Mucus PRESENT    Hyaline  Casts, UA PRESENT     Comment: Performed at [facility], [address] [place], [place] [address]  Urine Culture     Status: None   Collection Time: 04/01/23 12:47 AM   Specimen: Urine, Random  Result Value Ref Range   Specimen Description URINE, RANDOM    Special Requests NONE Reflexed from [id number]    Culture      NO GROWTH Performed at [facility], [address], [place], [place] [address]    Report Status 04/02/2023 FINAL   Glucose, capillary     Status: Abnormal   Collection Time: 04/01/23  3:46 AM  Result Value Ref Range   Glucose-Capillary 144 (H) 70 - 99 mg/dL    Comment: Glucose reference range applies only to samples taken  after fasting for at least 8 hours.  I-STAT 7, (LYTES, BLD GAS, ICA, H+H)     Status: Abnormal   Collection Time: 04/01/23  6:30 AM  Result Value Ref Range   pH, Arterial 7.437 7.35 - 7.45   pCO2 arterial 30.1 (L) 32 - 48 mmHg   pO2, Arterial 103 83 - 108 mmHg   Bicarbonate 20.3 20.0 - 28.0 mmol/L   TCO2 21 (L) 22 - 32 mmol/L   O2 Saturation 98 %   Acid-base deficit 3.0 (H) 0.0 - 2.0 mmol/L   Sodium 141 135 - 145 mmol/L   Potassium 2.7 (LL) 3.5 - 5.1 mmol/L   Calcium, Ion 1.12 (L) 1.15 - 1.40 mmol/L   HCT 34.0 (L) 36.0 - 46.0 %   Hemoglobin 11.6 (L) 12.0 - 15.0 g/dL   Patient temperature 14.7 F    Collection site RADIAL, ALLEN'S TEST ACCEPTABLE    Drawn by RT    Sample type ARTERIAL    Comment NOTIFIED PHYSICIAN   Basic metabolic panel     Status: Abnormal   Collection Time: 04/01/23  6:34 AM  Result Value Ref Range   Sodium 141 135 - 145 mmol/L   Potassium 2.9 (L) 3.5 - 5.1 mmol/L   Chloride 107 98 - 111 mmol/L   CO2 21 (L) 22 - 32 mmol/L   Glucose, Bld 116 (H) 70 - 99 mg/dL    Comment: Glucose reference range applies only to samples taken after fasting for at least 8 hours.   BUN 12 6 - 20 mg/dL   Creatinine, Ser 8.29 0.44 - 1.00 mg/dL   Calcium 8.2 (L) 8.9 - 10.3 mg/dL   GFR, Estimated >56 >21 mL/min    Comment: (NOTE) Calculated using the CKD-EPI Creatinine Equation (2021)    Anion gap 13 5 - 15    Comment: Performed at [facility] Lab, [address], [place], [place] [address]  Magnesium     Status: None   Collection Time: 04/01/23  6:34 AM  Result Value Ref Range   Magnesium 1.7 1.7 - 2.4 mg/dL    Comment: Performed at [facility], [address] [place], [place] [address]  Phosphorus     Status: Abnormal   Collection Time: 04/01/23  6:34 AM  Result Value Ref Range   Phosphorus 2.3 (L) 2.5 - 4.6 mg/dL    Comment: Performed at [facility] Lab, [address] [place], [place] [address]  CK     Status: Abnormal   Collection Time: 04/01/23  6:34 AM   Result Value Ref Range   Total CK 3,951 (H) 38 - 234 U/L    Comment: Performed at [facility]  [facility], [address], [place], [place] [address]  Glucose, capillary     Status: Abnormal   Collection Time: 04/01/23  7:35 AM  Result Value Ref Range   Glucose-Capillary 128 (H) 70 - 99 mg/dL    Comment: Glucose reference range applies only to samples taken after fasting for at least 8 hours.  CBC     Status: Abnormal   Collection Time: 04/01/23  7:43 AM  Result Value Ref Range   WBC 17.6 (H) 4.0 - 10.5 K/uL   RBC 3.65 (L) 3.87 - 5.11 MIL/uL   Hemoglobin 11.1 (L) 12.0 - 15.0 g/dL   HCT 19.1 (L) 47.8 - 29.5 %   MCV 89.0 80.0 - 100.0 fL   MCH 30.4 26.0 - 34.0 pg   MCHC 34.2 30.0 - 36.0 g/dL   RDW 62.1 30.8 - 65.7 %   Platelets 249 150 - 400 K/uL   nRBC 0.0 0.0 - 0.2 %    Comment: Performed at [facility], [address] [place], [place] [address]  Glucose, capillary     Status: Abnormal   Collection Time: 04/01/23 11:37 AM  Result Value Ref Range   Glucose-Capillary 105 (H) 70 - 99 mg/dL    Comment: Glucose reference range applies only to samples taken after fasting for at least 8 hours.  Glucose, capillary     Status: None   Collection Time: 04/01/23  4:15 PM  Result Value Ref Range   Glucose-Capillary 89 70 - 99 mg/dL    Comment: Glucose reference range applies only to samples taken after fasting for at least 8 hours.  Glucose, capillary     Status: Abnormal   Collection Time: 04/01/23  7:50 PM  Result Value Ref Range   Glucose-Capillary 130 (H) 70 - 99 mg/dL    Comment: Glucose reference range applies only to samples taken after fasting for at least 8 hours.  Glucose, capillary     Status: None   Collection Time: 04/01/23 11:40 PM  Result Value Ref Range   Glucose-Capillary 98 70 - 99 mg/dL    Comment: Glucose reference range applies only to samples taken after fasting for at least 8 hours.  Glucose, capillary     Status: None   Collection Time: 04/02/23  3:40 AM   Result Value Ref Range   Glucose-Capillary 98 70 - 99 mg/dL    Comment: Glucose reference range applies only to samples taken after fasting for at least 8 hours.  Basic metabolic panel     Status: Abnormal   Collection Time: 04/02/23  3:58 AM  Result Value Ref Range   Sodium 139 135 - 145 mmol/L   Potassium 2.8 (L) 3.5 - 5.1 mmol/L   Chloride 102 98 - 111 mmol/L   CO2 23 22 - 32 mmol/L   Glucose, Bld 105 (H) 70 - 99 mg/dL    Comment: Glucose reference range applies only to samples taken after fasting for at least 8 hours.   BUN 5 (L) 6 - 20 mg/dL   Creatinine, Ser 2.95 0.44 - 1.00 mg/dL   Calcium 9.1 8.9 - 28.4 mg/dL   GFR, Estimated >13 >24 mL/min    Comment: (NOTE) Calculated using the CKD-EPI Creatinine Equation (2021)    Anion gap 14 5 - 15    Comment: Performed at [facility], [address], [place], [place] [address]  Magnesium     Status: None   Collection Time: 04/02/23  3:58 AM  Result Value Ref Range   Magnesium 1.8 1.7 - 2.4 mg/dL    Comment: Performed at [facility], [address] [place], [place] [address]  Phosphorus     Status: Abnormal   Collection Time: 04/02/23  3:58 AM  Result Value Ref Range   Phosphorus 1.9 (L) 2.5 - 4.6 mg/dL    Comment: Performed at [facility], [address] [place], [place] [address]  CBC     Status: Abnormal   Collection Time: 04/02/23  3:58 AM  Result Value Ref Range   WBC 16.5 (H) 4.0 - 10.5 K/uL   RBC 4.53 3.87 - 5.11 MIL/uL   Hemoglobin 13.2 12.0 - 15.0 g/dL   HCT 09.8 11.9 - 14.7 %   MCV 88.3 80.0 - 100.0 fL   MCH 29.1 26.0 - 34.0 pg   MCHC 33.0 30.0 - 36.0 g/dL   RDW 82.9 56.2 - 13.0 %   Platelets 261 150 - 400 K/uL   nRBC 0.0 0.0 - 0.2 %    Comment: Performed at [facility], [address] [place], [place] [address]  Glucose, capillary     Status: Abnormal   Collection Time: 04/02/23  8:04 AM  Result Value Ref Range   Glucose-Capillary 135 (H) 70 - 99 mg/dL    Comment: Glucose  reference range applies only to samples taken after fasting for at least 8 hours.  Glucose, capillary     Status: Abnormal   Collection Time: 04/02/23 11:10 AM  Result Value Ref Range   Glucose-Capillary 125 (H) 70 - 99 mg/dL    Comment: Glucose reference range applies only to samples taken after fasting for at least 8 hours.  Glucose, capillary     Status: Abnormal   Collection Time: 04/02/23  4:02 PM  Result Value Ref Range   Glucose-Capillary 148 (H) 70 - 99 mg/dL    Comment: Glucose reference range applies only to samples taken after fasting for at least 8 hours.  Glucose, capillary     Status: Abnormal   Collection Time: 04/02/23  9:30 PM  Result Value Ref Range   Glucose-Capillary 115 (H) 70 - 99 mg/dL    Comment: Glucose reference range applies only to samples taken after fasting for at least 8 hours.   Comment 1 Notify RN    Comment 2 Document in Chart   Basic metabolic panel     Status: Abnormal   Collection Time: 04/03/23  6:47 AM  Result Value Ref Range   Sodium 140 135 - 145 mmol/L   Potassium 3.2 (L) 3.5 - 5.1 mmol/L   Chloride 107 98 - 111 mmol/L   CO2 23 22 - 32 mmol/L   Glucose, Bld 108 (H) 70 - 99 mg/dL    Comment: Glucose reference range applies only to samples taken after fasting for at least 8 hours.   BUN <5 (L) 6 - 20 mg/dL   Creatinine, Ser 4.69 0.44 - 1.00 mg/dL   Calcium 8.6 (L) 8.9 - 10.3 mg/dL   GFR, Estimated >62 >95 mL/min    Comment: (NOTE) Calculated using the CKD-EPI Creatinine Equation (2021)    Anion gap 10 5 - 15    Comment: Performed at [facility], [address] [place], [place] [address]  CBC     Status: Abnormal   Collection Time: 04/03/23  6:47 AM  Result Value Ref Range   WBC 12.1 (H) 4.0 - 10.5 K/uL   RBC  3.94 3.87 - 5.11 MIL/uL   Hemoglobin 11.4 (L) 12.0 - 15.0 g/dL   HCT 40.9 (L) 81.1 - 91.4 %   MCV 89.8 80.0 - 100.0 fL   MCH 28.9 26.0 - 34.0 pg   MCHC 32.2 30.0 - 36.0 g/dL   RDW 78.2 95.6 - 21.3 %   Platelets 230 150  - 400 K/uL   nRBC 0.0 0.0 - 0.2 %    Comment: Performed at [facility], [address], [place], [place] [address]  CK     Status: Abnormal   Collection Time: 04/03/23  6:47 AM  Result Value Ref Range   Total CK 9,175 (H) 38 - 234 U/L    Comment: RESULT CONFIRMED BY MANUAL DILUTION Performed at [facility], [address], [place], [place] [address]   Glucose, capillary     Status: Abnormal   Collection Time: 04/03/23  8:41 AM  Result Value Ref Range   Glucose-Capillary 142 (H) 70 - 99 mg/dL    Comment: Glucose reference range applies only to samples taken after fasting for at least 8 hours.  Glucose, capillary     Status: None   Collection Time: 04/03/23 11:29 AM  Result Value Ref Range   Glucose-Capillary 98 70 - 99 mg/dL    Comment: Glucose reference range applies only to samples taken after fasting for at least 8 hours.  Glucose, capillary     Status: Abnormal   Collection Time: 04/03/23 12:35 PM  Result Value Ref Range   Glucose-Capillary 169 (H) 70 - 99 mg/dL    Comment: Glucose reference range applies only to samples taken after fasting for at least 8 hours.  Glucose, capillary     Status: Abnormal   Collection Time: 04/03/23  4:28 PM  Result Value Ref Range   Glucose-Capillary 129 (H) 70 - 99 mg/dL    Comment: Glucose reference range applies only to samples taken after fasting for at least 8 hours.  Glucose, capillary     Status: Abnormal   Collection Time: 04/03/23  9:26 PM  Result Value Ref Range   Glucose-Capillary 117 (H) 70 - 99 mg/dL    Comment: Glucose reference range applies only to samples taken after fasting for at least 8 hours.  Basic metabolic panel     Status: Abnormal   Collection Time: 04/03/23 11:32 PM  Result Value Ref Range   Sodium 141 135 - 145 mmol/L   Potassium 3.5 3.5 - 5.1 mmol/L   Chloride 105 98 - 111 mmol/L   CO2 23 22 - 32 mmol/L   Glucose, Bld 104 (H) 70 - 99 mg/dL    Comment: Glucose reference range applies only to  samples taken after fasting for at least 8 hours.   BUN 5 (L) 6 - 20 mg/dL   Creatinine, Ser 2.95 0.44 - 1.00 mg/dL   Calcium 8.6 (L) 8.9 - 10.3 mg/dL   GFR, Estimated >28 >41 mL/min    Comment: (NOTE) Calculated using the CKD-EPI Creatinine Equation (2021)    Anion gap 13 5 - 15    Comment: Performed at [facility], [address] [place], [place] [address]  CK     Status: Abnormal   Collection Time: 04/03/23 11:32 PM  Result Value Ref Range   Total CK 11,408 (H) 38 - 234 U/L    Comment: RESULT CONFIRMED BY MANUAL DILUTION Performed at [facility], [address], [place], [place] [address]  Glucose, capillary     Status: None   Collection Time: 04/04/23  8:17 AM  Result Value Ref Range   Glucose-Capillary 94 70 - 99 mg/dL    Comment: Glucose reference range applies only to samples taken after fasting for at least 8 hours.  Glucose, capillary     Status: Abnormal   Collection Time: 04/04/23 12:29 PM  Result Value Ref Range   Glucose-Capillary 153 (H) 70 - 99 mg/dL    Comment: Glucose reference range applies only to samples taken after fasting for at least 8 hours.  Glucose, capillary     Status: Abnormal   Collection Time: 04/04/23  5:20 PM  Result Value Ref Range   Glucose-Capillary 100 (H) 70 - 99 mg/dL    Comment: Glucose reference range applies only to samples taken after fasting for at least 8 hours.  Glucose, capillary     Status: None   Collection Time: 04/04/23  7:16 PM  Result Value Ref Range   Glucose-Capillary 90 70 - 99 mg/dL    Comment: Glucose reference range applies only to samples taken after fasting for at least 8 hours.  Basic metabolic panel     Status: Abnormal   Collection Time: 04/04/23 11:35 PM  Result Value Ref Range   Sodium 141 135 - 145 mmol/L   Potassium 3.3 (L) 3.5 - 5.1 mmol/L   Chloride 107 98 - 111 mmol/L   CO2 20 (L) 22 - 32 mmol/L   Glucose, Bld 100 (H) 70 - 99 mg/dL    Comment: Glucose reference range applies only to  samples taken after fasting for at least 8 hours.   BUN <5 (L) 6 - 20 mg/dL   Creatinine, Ser 1.61 0.44 - 1.00 mg/dL   Calcium 9.2 8.9 - 09.6 mg/dL   GFR, Estimated >04 >54 mL/min    Comment: (NOTE) Calculated using the CKD-EPI Creatinine Equation (2021)    Anion gap 14 5 - 15    Comment: Performed at [facility], [address] [place], [place] [address]  CK     Status: Abnormal   Collection Time: 04/04/23 11:35 PM  Result Value Ref Range   Total CK 8,050 (H) 38 - 234 U/L    Comment: RESULTS CONFIRMED BY MANUAL DILUTION Performed at [facility], [address] [place], [place] [address]   Glucose, capillary     Status: None   Collection Time: 04/05/23  7:42 AM  Result Value Ref Range   Glucose-Capillary 94 70 - 99 mg/dL    Comment: Glucose reference range applies only to samples taken after fasting for at least 8 hours.  CK     Status: Abnormal   Collection Time: 04/05/23  8:47 AM  Result Value Ref Range   Total CK 5,991 (H) 38 - 234 U/L    Comment: RESULT CONFIRMED BY MANUAL DILUTION Performed at [facility], [address], [place], [place] [address]   Glucose, capillary     Status: None   Collection Time: 04/05/23 12:18 PM  Result Value Ref Range   Glucose-Capillary 86 70 - 99 mg/dL    Comment: Glucose reference range applies only to samples taken after fasting for at least 8 hours.  CBC with Differential     Status: Abnormal   Collection Time: 04/10/23  9:40 AM  Result Value Ref Range   WBC 13.9 (H) 4.0 - 10.5 K/uL   RBC 4.16 3.87 - 5.11 MIL/uL   Hemoglobin  12.1 12.0 - 15.0 g/dL   HCT 16.1 09.6 - 04.5 %   MCV 89.9 80.0 - 100.0 fL   MCH 29.1 26.0 - 34.0 pg   MCHC 32.4 30.0 - 36.0 g/dL   RDW 40.9 81.1 - 91.4 %   Platelets 352 150 - 400 K/uL   nRBC 0.0 0.0 - 0.2 %   Neutrophils Relative % 76 %   Neutro Abs 10.6 (H) 1.7 - 7.7 K/uL   Lymphocytes Relative 17 %   Lymphs Abs 2.4 0.7 - 4.0 K/uL   Monocytes Relative 5 %   Monocytes Absolute 0.7  0.1 - 1.0 K/uL   Eosinophils Relative 1 %   Eosinophils Absolute 0.1 0.0 - 0.5 K/uL   Basophils Relative 0 %   Basophils Absolute 0.0 0.0 - 0.1 K/uL   Immature Granulocytes 1 %   Abs Immature Granulocytes 0.10 (H) 0.00 - 0.07 K/uL    Comment: Performed at [facility], [address], [place], [place] [address]  Comprehensive metabolic panel     Status: Abnormal   Collection Time: 04/10/23  9:40 AM  Result Value Ref Range   Sodium 140 135 - 145 mmol/L   Potassium 2.8 (L) 3.5 - 5.1 mmol/L   Chloride 103 98 - 111 mmol/L   CO2 25 22 - 32 mmol/L   Glucose, Bld 123 (H) 70 - 99 mg/dL    Comment: Glucose reference range applies only to samples taken after fasting for at least 8 hours.   BUN 5 (L) 6 - 20 mg/dL   Creatinine, Ser 6.21 0.44 - 1.00 mg/dL   Calcium 9.9 8.9 - 30.8 mg/dL   Total Protein 6.8 6.5 - 8.1 g/dL   Albumin 3.6 3.5 - 5.0 g/dL   AST 38 15 - 41 U/L   ALT 49 (H) 0 - 44 U/L   Alkaline Phosphatase 39 38 - 126 U/L   Total Bilirubin 1.0 0.3 - 1.2 mg/dL   GFR, Estimated >65 >78 mL/min    Comment: (NOTE) Calculated using the CKD-EPI Creatinine Equation (2021)    Anion gap 12 5 - 15    Comment: Performed at [facility], [address], [place], [place] [address]  Magnesium     Status: Abnormal   Collection Time: 04/10/23  9:40 AM  Result Value Ref Range   Magnesium <0.5 (LL) 1.7 - 2.4 mg/dL    Comment: REPEATED TO VERIFY CRITICAL RESULT CALLED TO, READ BACK BY AND VERIFIED WITH P.PULLIAM,RN 1026 04/10/23 [place],S Performed at [facility], [address], [place], [place] [address]   Phosphorus     Status: None   Collection Time: 04/10/23  9:40 AM  Result Value Ref Range   Phosphorus 3.0 2.5 - 4.6 mg/dL    Comment: Performed at [facility], [address] [place], [place] [address]  Hemoglobin A1c     Status: None   Collection Time: 04/10/23  9:40 AM  Result Value Ref Range   Hgb A1c MFr Bld 5.3 4.8 - 5.6 %    Comment: (NOTE) Pre diabetes:           5.7%-6.4%  Diabetes:              >6.4%  Glycemic control for   <7.0% adults with diabetes    Mean Plasma Glucose 105.41 mg/dL    Comment: Performed at [facility], [address] [place], [place] [address]  Basic metabolic panel  Status: Abnormal   Collection Time: 04/10/23  2:39 PM  Result Value Ref Range   Sodium 142 135 - 145 mmol/L   Potassium 3.6 3.5 - 5.1 mmol/L   Chloride 104 98 - 111 mmol/L   CO2 25 22 - 32 mmol/L   Glucose, Bld 100 (H) 70 - 99 mg/dL    Comment: Glucose reference range applies only to samples taken after fasting for at least 8 hours.   BUN <5 (L) 6 - 20 mg/dL   Creatinine, Ser 1.61 0.44 - 1.00 mg/dL   Calcium 9.4 8.9 - 09.6 mg/dL   GFR, Estimated >04 >54 mL/min    Comment: (NOTE) Calculated using the CKD-EPI Creatinine Equation (2021)    Anion gap 13 5 - 15    Comment: Performed at [facility], [address] [place], [place] [address]  Glucose, capillary     Status: Abnormal   Collection Time: 04/10/23  4:59 PM  Result Value Ref Range   Glucose-Capillary 126 (H) 70 - 99 mg/dL    Comment: Glucose reference range applies only to samples taken after fasting for at least 8 hours.  HIV Antibody (routine testing w rflx)     Status: None   Collection Time: 04/11/23  7:15 AM  Result Value Ref Range   HIV Screen 4th Generation wRfx Non Reactive Non Reactive    Comment: Performed at [facility], [address], [place], [place] [address]  Magnesium     Status: Abnormal   Collection Time: 04/11/23  7:15 AM  Result Value Ref Range   Magnesium 1.0 (L) 1.7 - 2.4 mg/dL    Comment: Performed at [facility], [address] [place], [place] [address]  Comprehensive metabolic panel     Status: Abnormal   Collection Time: 04/11/23  7:15 AM  Result Value Ref Range   Sodium 138 135 - 145 mmol/L   Potassium 3.7 3.5 - 5.1 mmol/L   Chloride 104 98 - 111 mmol/L   CO2 23 22 - 32 mmol/L   Glucose, Bld 109 (H) 70 - 99 mg/dL     Comment: Glucose reference range applies only to samples taken after fasting for at least 8 hours.   BUN <5 (L) 6 - 20 mg/dL   Creatinine, Ser 1.30 0.44 - 1.00 mg/dL   Calcium 9.6 8.9 - 86.5 mg/dL   Total Protein 7.2 6.5 - 8.1 g/dL   Albumin 3.6 3.5 - 5.0 g/dL   AST 37 15 - 41 U/L   ALT 43 0 - 44 U/L   Alkaline Phosphatase 50 38 - 126 U/L   Total Bilirubin 0.9 0.3 - 1.2 mg/dL   GFR, Estimated >78 >46 mL/min    Comment: (NOTE) Calculated using the CKD-EPI Creatinine Equation (2021)    Anion gap 11 5 - 15    Comment: Performed at [facility], [address] [place], [place] [address]  CBC     Status: None   Collection Time: 04/11/23  7:15 AM  Result Value Ref Range   WBC 8.9 4.0 - 10.5 K/uL   RBC 4.48 3.87 - 5.11 MIL/uL   Hemoglobin 13.0 12.0 - 15.0 g/dL   HCT 28.4 13.2 - 44.0 %   MCV 89.1 80.0 - 100.0 fL   MCH 29.0 26.0 - 34.0 pg   MCHC 32.6 30.0 - 36.0 g/dL   RDW 10.2 72.5 - 36.6 %   Platelets 381 150 - 400 K/uL  nRBC 0.0 0.0 - 0.2 %    Comment: Performed at [facility], [address] [place], [place] [address]  T4, free     Status: None   Collection Time: 04/11/23  7:15 AM  Result Value Ref Range   Free T4 0.92 0.61 - 1.12 ng/dL    Comment: (NOTE) Biotin ingestion may interfere with free T4 tests. If the results are inconsistent with the TSH level, previous test results, or the clinical presentation, then consider biotin interference. If needed, order repeat testing after stopping biotin. Performed at [facility], [address] [place], [place] [address]   C-reactive protein     Status: None   Collection Time: 04/11/23  7:15 AM  Result Value Ref Range   CRP 0.6 <1.0 mg/dL    Comment: Performed at [facility], [address], [place], [place] [address]  Procalcitonin     Status: None   Collection Time: 04/11/23  7:15 AM  Result Value Ref Range   Procalcitonin <0.10 ng/mL    Comment:        Interpretation: PCT (Procalcitonin) <=  0.5 ng/mL: Systemic infection (sepsis) is not likely. Local bacterial infection is possible. (NOTE)       Sepsis PCT Algorithm           Lower Respiratory Tract                                      Infection PCT Algorithm    ----------------------------     ----------------------------         PCT < 0.25 ng/mL                PCT < 0.10 ng/mL          Strongly encourage             Strongly discourage   discontinuation of antibiotics    initiation of antibiotics    ----------------------------     -----------------------------       PCT 0.25 - 0.50 ng/mL            PCT 0.10 - 0.25 ng/mL               OR       >80% decrease in PCT            Discourage initiation of                                            antibiotics      Encourage discontinuation           of antibiotics    ----------------------------     -----------------------------         PCT >= 0.50 ng/mL              PCT 0.26 - 0.50 ng/mL               AND        <80% decrease in PCT             Encourage initiation of  antibiotics       Encourage continuation           of antibiotics    ----------------------------     -----------------------------        PCT >= 0.50 ng/mL                  PCT > 0.50 ng/mL               AND         increase in PCT                  Strongly encourage                                      initiation of antibiotics    Strongly encourage escalation           of antibiotics                                     -----------------------------                                           PCT <= 0.25 ng/mL                                                 OR                                        > 80% decrease in PCT                                      Discontinue / Do not initiate                                             antibiotics  Performed at [facility], [address], [place], [place] [address]   TSH     Status: None   Collection  Time: 04/11/23  7:22 AM  Result Value Ref Range   TSH 1.083 0.350 - 4.500 uIU/mL    Comment: Performed by a 3rd Generation assay with a functional sensitivity of <=0.01 uIU/mL. Performed at [facility], [address], [place], [place] [address]   Glucose, capillary     Status: Abnormal   Collection Time: 04/11/23  8:22 AM  Result Value Ref Range   Glucose-Capillary 148 (H) 70 - 99 mg/dL    Comment: Glucose reference range applies only to samples taken after fasting for at least 8 hours.  Urinalysis, w/ Reflex to Culture (Infection Suspected) -Urine, Clean Catch     Status: Abnormal   Collection Time: 04/11/23 11:05 AM  Result Value Ref Range   Specimen Source URINE, CLEAN CATCH    Color, Urine STRAW (A) YELLOW  APPearance CLEAR CLEAR   Specific Gravity, Urine 1.015 1.005 - 1.030   pH 7.0 5.0 - 8.0   Glucose, UA NEGATIVE NEGATIVE mg/dL   Hgb urine dipstick NEGATIVE NEGATIVE   Bilirubin Urine NEGATIVE NEGATIVE   Ketones, ur NEGATIVE NEGATIVE mg/dL   Protein, ur NEGATIVE NEGATIVE mg/dL   Nitrite NEGATIVE NEGATIVE   Leukocytes,Ua NEGATIVE NEGATIVE   RBC / HPF 0-5 0 - 5 RBC/hpf   WBC, UA 0-5 0 - 5 WBC/hpf    Comment:        Reflex urine culture not performed if WBC <=10, OR if Squamous epithelial cells >5. If Squamous epithelial cells >5 suggest recollection.    Bacteria, UA NONE SEEN NONE SEEN   Squamous Epithelial / HPF 0-5 0 - 5 /HPF    Comment: Performed at [facility], [address] [place], [place] [address]  Glucose, capillary     Status: None   Collection Time: 04/11/23 11:34 AM  Result Value Ref Range   Glucose-Capillary 91 70 - 99 mg/dL    Comment: Glucose reference range applies only to samples taken after fasting for at least 8 hours.  Glucose, capillary     Status: None   Collection Time: 04/11/23  5:04 PM  Result Value Ref Range   Glucose-Capillary 80 70 - 99 mg/dL    Comment: Glucose reference range applies only to samples taken after fasting  for at least 8 hours.  Glucose, capillary     Status: Abnormal   Collection Time: 04/11/23  9:02 PM  Result Value Ref Range   Glucose-Capillary 105 (H) 70 - 99 mg/dL    Comment: Glucose reference range applies only to samples taken after fasting for at least 8 hours.  CBC with Differential/Platelet     Status: Abnormal   Collection Time: 04/12/23 12:46 AM  Result Value Ref Range   WBC 9.8 4.0 - 10.5 K/uL   RBC 4.14 3.87 - 5.11 MIL/uL   Hemoglobin 12.5 12.0 - 15.0 g/dL   HCT 83.1 51.7 - 61.6 %   MCV 91.8 80.0 - 100.0 fL   MCH 30.2 26.0 - 34.0 pg   MCHC 32.9 30.0 - 36.0 g/dL   RDW 07.3 71.0 - 62.6 %   Platelets 421 (H) 150 - 400 K/uL   nRBC 0.0 0.0 - 0.2 %   Neutrophils Relative % 62 %   Neutro Abs 6.0 1.7 - 7.7 K/uL   Lymphocytes Relative 32 %   Lymphs Abs 3.2 0.7 - 4.0 K/uL   Monocytes Relative 5 %   Monocytes Absolute 0.5 0.1 - 1.0 K/uL   Eosinophils Relative 1 %   Eosinophils Absolute 0.1 0.0 - 0.5 K/uL   Basophils Relative 0 %   Basophils Absolute 0.0 0.0 - 0.1 K/uL   Immature Granulocytes 0 %   Abs Immature Granulocytes 0.04 0.00 - 0.07 K/uL    Comment: Performed at [facility], [address], [place], [place] [address]  Brain natriuretic peptide     Status: Abnormal   Collection Time: 04/12/23 12:46 AM  Result Value Ref Range   B Natriuretic Peptide 181.5 (H) 0.0 - 100.0 pg/mL    Comment: Performed at [facility], [address], [place], [place] [address]  Comprehensive metabolic panel     Status: Abnormal   Collection Time: 04/12/23 12:46 AM  Result Value Ref Range   Sodium 136 135 - 145 mmol/L   Potassium 3.4 (L) 3.5 - 5.1 mmol/L  Chloride 101 98 - 111 mmol/L   CO2 22 22 - 32 mmol/L   Glucose, Bld 104 (H) 70 - 99 mg/dL    Comment: Glucose reference range applies only to samples taken after fasting for at least 8 hours.   BUN 6 6 - 20 mg/dL   Creatinine, Ser 4.09 0.44 - 1.00 mg/dL   Calcium 9.2 8.9 - 81.1 mg/dL   Total Protein 6.8 6.5 - 8.1 g/dL    Albumin 3.7 3.5 - 5.0 g/dL   AST 28 15 - 41 U/L   ALT 39 0 - 44 U/L   Alkaline Phosphatase 45 38 - 126 U/L   Total Bilirubin 0.9 0.3 - 1.2 mg/dL   GFR, Estimated >91 >47 mL/min    Comment: (NOTE) Calculated using the CKD-EPI Creatinine Equation (2021)    Anion gap 13 5 - 15    Comment: Performed at [facility] Lab, [address], [place], [place] [address]  C-reactive protein     Status: None   Collection Time: 04/12/23 12:46 AM  Result Value Ref Range   CRP <0.5 <1.0 mg/dL    Comment: Performed at [facility], [address] [place], [place] [address]  Procalcitonin     Status: None   Collection Time: 04/12/23 12:46 AM  Result Value Ref Range   Procalcitonin <0.10 ng/mL    Comment:        Interpretation: PCT (Procalcitonin) <= 0.5 ng/mL: Systemic infection (sepsis) is not likely. Local bacterial infection is possible. (NOTE)       Sepsis PCT Algorithm           Lower Respiratory Tract                                      Infection PCT Algorithm    ----------------------------     ----------------------------         PCT < 0.25 ng/mL                PCT < 0.10 ng/mL          Strongly encourage             Strongly discourage   discontinuation of antibiotics    initiation of antibiotics    ----------------------------     -----------------------------       PCT 0.25 - 0.50 ng/mL            PCT 0.10 - 0.25 ng/mL               OR       >80% decrease in PCT            Discourage initiation of                                            antibiotics      Encourage discontinuation           of antibiotics    ----------------------------     -----------------------------         PCT >= 0.50 ng/mL              PCT 0.26 - 0.50 ng/mL               AND        <  80% decrease in PCT             Encourage initiation of                                             antibiotics       Encourage continuation           of antibiotics    ----------------------------      -----------------------------        PCT >= 0.50 ng/mL                  PCT > 0.50 ng/mL               AND         increase in PCT                  Strongly encourage                                      initiation of antibiotics    Strongly encourage escalation           of antibiotics                                     -----------------------------                                           PCT <= 0.25 ng/mL                                                 OR                                        > 80% decrease in PCT                                      Discontinue / Do not initiate                                             antibiotics  Performed at [facility], [address], [place], [place] [address]   Magnesium     Status: None   Collection Time: 04/12/23 12:46 AM  Result Value Ref Range   Magnesium 1.8 1.7 - 2.4 mg/dL    Comment: Performed at [facility], [address] [place], [place] [address]  Glucose, capillary     Status: Abnormal   Collection Time: 04/12/23  8:15 AM  Result Value Ref Range   Glucose-Capillary 107 (H) 70 - 99 mg/dL    Comment: Glucose reference range applies only to samples taken after fasting for at least  8 hours.  Troponin I (High Sensitivity)     Status: None   Collection Time: 04/12/23 10:20 AM  Result Value Ref Range   Troponin I (High Sensitivity) 12 <18 ng/L    Comment: (NOTE) Elevated high sensitivity troponin I (hsTnI) values and significant  changes across serial measurements may suggest ACS but many other  chronic and acute conditions are known to elevate hsTnI results.  Refer to the "Links" section for chest pain algorithms and additional  guidance. Performed at [facility], [address] [place], [place] [address]   Glucose, capillary     Status: None   Collection Time: 04/12/23  1:21 PM  Result Value Ref Range   Glucose-Capillary 86 70 - 99 mg/dL    Comment: Glucose reference range applies only to  samples taken after fasting for at least 8 hours.  Glucose, capillary     Status: Abnormal   Collection Time: 04/12/23  3:29 PM  Result Value Ref Range   Glucose-Capillary 125 (H) 70 - 99 mg/dL    Comment: Glucose reference range applies only to samples taken after fasting for at least 8 hours.  ECHOCARDIOGRAM COMPLETE     Status: None   Collection Time: 04/12/23  4:01 PM  Result Value Ref Range   Weight 3,086.44 oz   Height 65.5 in   BP 137/93 mmHg   S' Lateral 3.60 cm   AR max vel 3.48 cm2   AV Peak grad 5.7 mmHg   Ao pk vel 1.19 m/s   Area-P 1/2 3.85 cm2   MV M vel 1.15 m/s   MV Peak grad 5.3 mmHg   Est EF 55 - 60%   Basic Metabolic Panel     Status: Abnormal   Collection Time: 04/14/23 12:07 PM  Result Value Ref Range   Glucose 90 70 - 99 mg/dL   BUN 7 6 - 24 mg/dL   Creatinine, Ser 8.31 0.57 - 1.00 mg/dL   eGFR 517 >61 YW/VPX/1.06   BUN/Creatinine Ratio 10 9 - 23   Sodium 140 134 - 144 mmol/L   Potassium 4.4 3.5 - 5.2 mmol/L   Chloride 100 96 - 106 mmol/L   CO2 19 (L) 20 - 29 mmol/L   Calcium 10.9 (H) 8.7 - 10.2 mg/dL  Magnesium     Status: Abnormal   Collection Time: 04/14/23 12:07 PM  Result Value Ref Range   Magnesium 0.9 (LL) 1.6 - 2.3 mg/dL  [id number]     Status: None   Collection Time: 04/14/23 12:07 PM  Result Value Ref Range   TSH 2.170 0.450 - 4.500 uIU/mL   T3, Free 3.1 2.0 - 4.4 pg/mL   Free T4 1.17 0.82 - 1.77 ng/dL  CK     Status: None   Collection Time: 04/14/23 12:07 PM  Result Value Ref Range   Total CK 172 32 - 182 U/L  PTH, Intact and Calcium     Status: Abnormal   Collection Time: 04/21/23  2:00 PM  Result Value Ref Range   Calcium 10.5 (H) 8.7 - 10.2 mg/dL   PTH 18 15 - 65 pg/mL   PTH Interp Comment     Comment: Interpretation                 Intact PTH    Calcium                                 (  pg/mL)      (mg/dL) Normal                          15 - 65     8.6 - 10.2 Primary Hyperparathyroidism         >65           >10.2 Secondary Hyperparathyroidism       >65          <10.2 Non-Parathyroid Hypercalcemia       <65          >10.2 Hypoparathyroidism                  <15          < 8.6 Non-Parathyroid Hypocalcemia    15 - 65          < 8.6   VITAMIN D 25 Hydroxy (Vit-D Deficiency, Fractures)     Status: None   Collection Time: 04/21/23  2:00 PM  Result Value Ref Range   Vit D, 25-Hydroxy 30.7 30.0 - 100.0 ng/mL    Comment: Vitamin D deficiency has been defined by the Institute of Medicine and an Endocrine Society practice guideline as a level of serum 25-OH vitamin D less than 20 ng/mL (1,2). The Endocrine Society went on to further define vitamin D insufficiency as a level between 21 and 29 ng/mL (2). 1. IOM (Institute of Medicine). 2010. Dietary reference    intakes for calcium and D. [place]: The    [employer]. 2. Holick MF, Binkley , Bischoff-Ferrari HA, et al.    Evaluation, treatment, and prevention of vitamin D    deficiency: an Endocrine Society clinical practice    guideline. JCEM. 2011 Jul; 96(7):1911-30.   Magnesium     Status: Abnormal   Collection Time: 04/21/23  2:00 PM  Result Value Ref Range   Magnesium 0.6 (LL) 1.6 - 2.3 mg/dL  Lamotrigine level     Status: Abnormal   Collection Time: 05/29/23  1:38 PM  Result Value Ref Range   Lamotrigine Lvl 1.3 (L) 2.0 - 20.0 ug/mL    Comment:                                 Detection Limit = 1.0  Levetiracetam level     Status: None   Collection Time: 05/29/23  1:38 PM  Result Value Ref Range   Levetiracetam Lvl 17.7 10.0 - 40.0 ug/mL  CBC with Differential/Platelet     Status: None   Collection Time: 06/02/23  2:55 PM  Result Value Ref Range   WBC 8.8 4.0 - 10.5 K/uL   RBC 4.53 3.87 - 5.11 Mil/uL   Hemoglobin 13.1 12.0 - 15.0 g/dL   HCT 13.0 86.5 - 78.4 %   MCV 89.5 78.0 - 100.0 fl   MCHC 32.2 30.0 - 36.0 g/dL   RDW 69.6 29.5 - 28.4 %   Platelets 339.0 150.0 - 400.0 K/uL   Neutrophils Relative % 56.4 43.0 -  77.0 %   Lymphocytes Relative 35.6 12.0 - 46.0 %   Monocytes Relative 6.5 3.0 - 12.0 %   Eosinophils Relative 0.7 0.0 - 5.0 %   Basophils Relative 0.8 0.0 - 3.0 %   Neutro Abs 5.0 1.4 - 7.7 K/uL   Lymphs Abs 3.1 0.7 - 4.0 K/uL   Monocytes Absolute 0.6 0.1 - 1.0 K/uL  Eosinophils Absolute 0.1 0.0 - 0.7 K/uL   Basophils Absolute 0.1 0.0 - 0.1 K/uL  Comprehensive metabolic panel     Status: Abnormal   Collection Time: 06/02/23  2:55 PM  Result Value Ref Range   Sodium 135 135 - 145 mEq/L   Potassium 4.3 3.5 - 5.1 mEq/L   Chloride 100 96 - 112 mEq/L   CO2 26 19 - 32 mEq/L   Glucose, Bld 105 (H) 70 - 99 mg/dL   BUN 15 6 - 23 mg/dL   Creatinine, Ser 4.09 0.40 - 1.20 mg/dL   Total Bilirubin 0.5 0.2 - 1.2 mg/dL   Alkaline Phosphatase 56 39 - 117 U/L   AST 16 0 - 37 U/L   ALT 10 0 - 35 U/L   Total Protein 8.0 6.0 - 8.3 g/dL   Albumin 4.6 3.5 - 5.2 g/dL   GFR 81.19 >14.78 mL/min    Comment: Calculated using the CKD-EPI Creatinine Equation (2021)   Calcium 10.4 8.4 - 10.5 mg/dL  TSH     Status: None   Collection Time: 06/02/23  2:55 PM  Result Value Ref Range   TSH 2.67 0.35 - 5.50 uIU/mL  Sedimentation rate     Status: Abnormal   Collection Time: 06/02/23  2:55 PM  Result Value Ref Range   Sed Rate 61 (H) 0 - 20 mm/hr  High sensitivity CRP     Status: None   Collection Time: 06/02/23  2:55 PM  Result Value Ref Range   CRP, High Sensitivity 2.420 0.000 - 5.000 mg/L    Comment: Note:  An elevated hs-CRP (>5 mg/L) should be repeated after 2 weeks to rule out recent infection or trauma.  Magnesium     Status: None   Collection Time: 06/06/23  3:42 PM  Result Value Ref Range   Magnesium 2.0 1.6 - 2.3 mg/dL     Psychiatric Specialty Exam: Physical Exam  Review of Systems  Constitutional:  Positive for fatigue.  Psychiatric/Behavioral:  Positive for dysphoric mood. The patient is nervous/anxious.     Weight 186 lb (84.4 kg).There is no height or weight on file to calculate BMI.   General Appearance: Casual and lying on bed  Eye Contact:  Fair  Speech:  Slow  Volume:  Normal  Mood:  Anxious and Dysphoric  Affect:  Congruent  Thought Process:  Descriptions of Associations: Intact  Orientation:  Full (Time, Place, and Person)  Thought Content:  Rumination  Suicidal Thoughts:  No  Homicidal Thoughts:  No  Memory:  Immediate;   Good Recent;   Good Remote;   Good  Judgement:  Intact  Insight:  Present  Psychomotor Activity:  Decreased  Concentration:  Concentration: Fair and Attention Span: Fair  Recall:  Good  Fund of Knowledge:  Fair  Language:  Fair  Akathisia:  No  Handed:  Right  AIMS (if indicated):     Assets:  Communication Skills Desire for Improvement Housing Resilience Social Support  ADL's:  Intact  Cognition:  WNL  Sleep:  fair     Assessment/Plan: Bipolar 1 disorder (HCC) - Plan: FLUoxetine (PROZAC) 10 MG capsule  GAD (generalized anxiety disorder) - Plan: FLUoxetine (PROZAC) 10 MG capsule, LORazepam (ATIVAN) 1 MG tablet  I reviewed blood work results.  Magnesium normal.  She is taking Lamictal extended release 100 mg after instant release could not tolerate.  Her medicine is given by neurology.  She also on Keppra but hoping to come off once she gets  a therapeutic dose of Lamictal.  Lamictal level is 1.3.  Continue Prozac 30 mg daily and Ativan 1 mg 3 times a day.  She has no rash, itching.  She is hoping to improve Medicaid but she like to work part-time.  She is getting physical therapy and she is hoping it will get more strength so she can walk better.  Reassurance given.  Recommend to call us back if she has any question or any concern.  Follow-up in 3 months   Follow Up Instructions:     I discussed the assessment and treatment plan with the patient. The patient was provided an opportunity to ask questions and all were answered. The patient agreed with the plan and demonstrated an understanding of the instructions.   The patient  was advised to call back or seek an in-person evaluation if the symptoms worsen or if the condition fails to improve as anticipated.    Collaboration of Care: Other provider involved in patient's care AEB notes are available in epic to review.  I will also forward my note to her neurologist.  Patient/Guardian was advised Release of Information must be obtained prior to any record release in order to collaborate their care with an outside provider. Patient/Guardian was advised if they have not already done so to contact the registration department to sign all necessary forms in order for [place] to release information regarding their care.   Consent: Patient/Guardian gives verbal consent for treatment and assignment of benefits for services provided during this visit. Patient/Guardian expressed understanding and agreed to proceed.     I provided 21 minutes of non face to face time during this encounter.  Note: This document was prepared by [employer] voice dictation technology and any errors that results from this process are unintentional.    Cleotis Nipper, MD 06/22/2023

## 2023-06-29 ENCOUNTER — Encounter: Payer: Self-pay | Admitting: Internal Medicine

## 2023-06-29 ENCOUNTER — Other Ambulatory Visit (HOSPITAL_COMMUNITY): Payer: Self-pay

## 2023-06-30 ENCOUNTER — Other Ambulatory Visit: Payer: Self-pay

## 2023-07-04 ENCOUNTER — Encounter: Payer: Self-pay | Admitting: Internal Medicine

## 2023-07-04 ENCOUNTER — Telehealth: Payer: Self-pay | Admitting: Internal Medicine

## 2023-07-04 NOTE — Telephone Encounter (Signed)
Called & spoke to the patient. Verified name & DOB. Informed that letter is ready for pick-up. Patient stated that she already picked up the letter from the front office. No further assistance needed at this time.

## 2023-07-04 NOTE — Telephone Encounter (Signed)
Letter ready for pick up

## 2023-07-05 ENCOUNTER — Encounter: Payer: Self-pay | Admitting: Physician Assistant

## 2023-07-06 ENCOUNTER — Ambulatory Visit: Payer: No Typology Code available for payment source | Attending: Cardiology | Admitting: Cardiology

## 2023-07-06 ENCOUNTER — Encounter: Payer: Self-pay | Admitting: Cardiology

## 2023-07-06 ENCOUNTER — Ambulatory Visit: Payer: No Typology Code available for payment source | Attending: Cardiology

## 2023-07-06 VITALS — BP 112/86 | HR 88 | Ht 66.0 in | Wt 189.0 lb

## 2023-07-06 DIAGNOSIS — R002 Palpitations: Secondary | ICD-10-CM

## 2023-07-06 DIAGNOSIS — I10 Essential (primary) hypertension: Secondary | ICD-10-CM | POA: Diagnosis not present

## 2023-07-06 DIAGNOSIS — R9431 Abnormal electrocardiogram [ECG] [EKG]: Secondary | ICD-10-CM | POA: Diagnosis not present

## 2023-07-06 NOTE — Progress Notes (Unsigned)
Enrolled for [employer] to mail a ZIO XT long term holter monitor to the patients address on file.  

## 2023-07-06 NOTE — Patient Instructions (Signed)
Medication Instructions:  No changes *If you need a refill on your cardiac medications before your next appointment, please call your pharmacy*   Lab Work: CMP, TSH, Mag- today If you have labs (blood work) drawn today and your tests are completely normal, you will receive your results only by: MyChart Message (if you have MyChart) OR A paper copy in the mail If you have any lab test that is abnormal or we need to change your treatment, we will call you to review the results.   Testing/Procedures: Your physician has recommended that you wear a 14 DAY ZIO-PATCH monitor. The Zio patch cardiac monitor continuously records heart rhythm data for up to 14 days, this is for patients being evaluated for multiple types heart rhythms. For the first 24 hours post application, please avoid getting the Zio monitor wet in the shower or by excessive sweating during exercise. After that, feel free to carry on with regular activities. Keep soaps and lotions away from the ZIO XT Patch.  This will be mailed to you, please expect 7-10 days to receive.    Applying the monitor   Shave hair from upper left chest.   Hold abrader disc by orange tab.  Rub abrader in 40 strokes over left upper chest as indicated in your monitor instructions.   Clean area with 4 enclosed alcohol pads .  Use all pads to assure are is cleaned thoroughly.  Let dry.   Apply patch as indicated in monitor instructions.  Patch will be place under collarbone on left side of chest with arrow pointing upward.   Rub patch adhesive wings for 2 minutes.Remove white label marked "1".  Remove white label marked "2".  Rub patch adhesive wings for 2 additional minutes.   While looking in a mirror, press and release button in center of patch.  A small green light will flash 3-4 times .  This will be your only indicator the monitor has been turned on.     Do not shower for the first 24 hours.  You may shower after the first 24 hours.   Press  button if you feel a symptom. You will hear a small click.  Record Date, Time and Symptom in the Patient Log Book.   When you are ready to remove patch, follow instructions on last 2 pages of Patient Log Book.  Stick patch monitor onto last page of Patient Log Book.   Place Patient Log Book in [place] box.  Use locking tab on box and tape box closed securely.  The [employer] and [employer] has [employer] on it.  Please place in mailbox as soon as possible.  Your physician should have your test results approximately 7 days after the monitor has been mailed back to [facility].   Call [facility] Customer Care at [phone] if you have questions regarding your ZIO XT patch monitor.  Call them immediately if you see an orange light blinking on your monitor.   If your monitor falls off in less than 4 days contact our Monitor department at [phone].  If your monitor becomes loose or falls off after 4 days call [employer] at [phone] for suggestions on securing your monitor    Follow-Up: At [facility], you and your health needs are our priority.  As part of our continuing mission to provide you with exceptional heart care, we have created designated Provider Care Teams.  These Care Teams include your primary Cardiologist (physician) and Advanced Practice Providers (APPs -  Physician Assistants and Nurse Practitioners) who all work together to provide you with the care you need, when you need it.  We recommend signing up for the patient portal called "MyChart".  Sign up information is provided on this After Visit Summary.  MyChart is used to connect with patients for Virtual Visits (Telemedicine).  Patients are able to view lab/test results, encounter notes, upcoming appointments, etc.  Non-urgent messages can be sent to your provider as well.   To learn more about what you can do with MyChart, go to [email or web address].    Your next appointment:   3-4 month(s)  Provider:    Dr Bjorn Pippin

## 2023-07-07 LAB — COMPREHENSIVE METABOLIC PANEL
ALT: 13 [IU]/L (ref 0–32)
AST: 17 [IU]/L (ref 0–40)
Albumin: 4.9 g/dL (ref 3.9–4.9)
Alkaline Phosphatase: 92 [IU]/L (ref 44–121)
BUN/Creatinine Ratio: 19 (ref 9–23)
BUN: 15 mg/dL (ref 6–24)
Bilirubin Total: 0.3 mg/dL (ref 0.0–1.2)
CO2: 22 mmol/L (ref 20–29)
Calcium: 10.5 mg/dL — ABNORMAL HIGH (ref 8.7–10.2)
Chloride: 100 mmol/L (ref 96–106)
Creatinine, Ser: 0.8 mg/dL (ref 0.57–1.00)
Globulin, Total: 2.8 g/dL (ref 1.5–4.5)
Glucose: 97 mg/dL (ref 70–99)
Potassium: 4.7 mmol/L (ref 3.5–5.2)
Sodium: 138 mmol/L (ref 134–144)
Total Protein: 7.7 g/dL (ref 6.0–8.5)
eGFR: 90 mL/min/{1.73_m2} (ref >59)

## 2023-07-07 LAB — MAGNESIUM: Magnesium: 1.8 mg/dL (ref 1.6–2.3)

## 2023-07-07 LAB — TSH: TSH: 3.18 u[IU]/mL (ref 0.450–4.500)

## 2023-07-10 ENCOUNTER — Other Ambulatory Visit: Payer: Self-pay

## 2023-07-10 ENCOUNTER — Encounter: Payer: Self-pay | Admitting: Neurology

## 2023-07-10 DIAGNOSIS — Z0271 Encounter for disability determination: Secondary | ICD-10-CM

## 2023-07-11 ENCOUNTER — Encounter: Payer: Self-pay | Admitting: Hematology and Oncology

## 2023-07-11 ENCOUNTER — Other Ambulatory Visit: Payer: Self-pay | Admitting: Internal Medicine

## 2023-07-11 ENCOUNTER — Other Ambulatory Visit: Payer: Self-pay

## 2023-07-11 DIAGNOSIS — E0590 Thyrotoxicosis, unspecified without thyrotoxic crisis or storm: Secondary | ICD-10-CM

## 2023-07-11 DIAGNOSIS — R002 Palpitations: Secondary | ICD-10-CM

## 2023-07-12 ENCOUNTER — Other Ambulatory Visit: Payer: Self-pay

## 2023-07-12 ENCOUNTER — Other Ambulatory Visit (HOSPITAL_COMMUNITY): Payer: Self-pay

## 2023-07-12 ENCOUNTER — Encounter (HOSPITAL_COMMUNITY): Payer: Self-pay

## 2023-07-12 MED ORDER — PROPRANOLOL HCL 10 MG PO TABS
10.0000 mg | ORAL_TABLET | Freq: Two times a day (BID) | ORAL | 0 refills | Status: DC
  Filled 2023-07-12 – 2023-07-13 (×2): qty 60, 30d supply, fill #0
  Filled 2023-08-10: qty 60, 30d supply, fill #1
  Filled 2023-09-09 – 2023-09-13 (×3): qty 60, 30d supply, fill #2

## 2023-07-13 ENCOUNTER — Other Ambulatory Visit: Payer: Self-pay

## 2023-07-13 ENCOUNTER — Inpatient Hospital Stay: Payer: No Typology Code available for payment source | Attending: Hematology and Oncology

## 2023-07-13 ENCOUNTER — Other Ambulatory Visit (HOSPITAL_COMMUNITY): Payer: Self-pay

## 2023-07-13 DIAGNOSIS — T733XXA Exhaustion due to excessive exertion, initial encounter: Secondary | ICD-10-CM

## 2023-07-13 DIAGNOSIS — R5383 Other fatigue: Secondary | ICD-10-CM | POA: Diagnosis present

## 2023-07-13 DIAGNOSIS — C50212 Malignant neoplasm of upper-inner quadrant of left female breast: Secondary | ICD-10-CM | POA: Insufficient documentation

## 2023-07-13 MED ORDER — NA SULFATE-K SULFATE-MG SULF 17.5-3.13-1.6 GM/177ML PO SOLN
1.0000 | Freq: Once | ORAL | 0 refills | Status: AC
  Filled 2023-07-13: qty 354, 1d supply, fill #0

## 2023-07-13 MED ORDER — CYANOCOBALAMIN 1000 MCG/ML IJ SOLN
1000.0000 ug | Freq: Once | INTRAMUSCULAR | Status: DC
Start: 2023-07-13 — End: 2023-07-13

## 2023-07-13 MED ORDER — CYANOCOBALAMIN 1000 MCG/ML IJ SOLN
1000.0000 ug | Freq: Once | INTRAMUSCULAR | Status: AC
  Administered 2023-07-13: 1000 ug via INTRAMUSCULAR

## 2023-07-13 NOTE — Telephone Encounter (Signed)
Patient needing prep sent into the pharmacy. Please advise.

## 2023-07-15 ENCOUNTER — Other Ambulatory Visit (HOSPITAL_COMMUNITY): Payer: Self-pay

## 2023-07-17 ENCOUNTER — Other Ambulatory Visit (HOSPITAL_COMMUNITY): Payer: Self-pay

## 2023-07-17 ENCOUNTER — Encounter: Payer: Self-pay | Admitting: Neurology

## 2023-07-24 ENCOUNTER — Other Ambulatory Visit: Payer: Self-pay | Admitting: Neurology

## 2023-07-24 ENCOUNTER — Other Ambulatory Visit: Payer: Self-pay

## 2023-07-24 ENCOUNTER — Encounter: Payer: No Typology Code available for payment source | Admitting: Internal Medicine

## 2023-07-24 ENCOUNTER — Telehealth: Payer: Self-pay | Admitting: Internal Medicine

## 2023-07-24 ENCOUNTER — Other Ambulatory Visit: Payer: Self-pay | Admitting: Internal Medicine

## 2023-07-24 ENCOUNTER — Other Ambulatory Visit (HOSPITAL_COMMUNITY): Payer: Self-pay

## 2023-07-24 MED ORDER — LAMOTRIGINE ER 50 MG PO TB24
ORAL_TABLET | ORAL | 3 refills | Status: DC
  Filled 2023-07-24 – 2023-07-27 (×3): qty 60, 30d supply, fill #0
  Filled 2023-08-22: qty 60, 30d supply, fill #1

## 2023-07-24 NOTE — Telephone Encounter (Signed)
As per RN note - given palpitations, long QTc - think best to postpone elective sedated endoscopic procedures until her cardiac evaluation is finished.

## 2023-07-24 NOTE — Telephone Encounter (Signed)
Made Dr. Leone Payor and Ermalene Postin, CRNA aware. Pt's chart reviewed.  D/t pt's hx and her currently wearing a heart monitor, they both feel that patient should f/u with cardiology prior to having colonoscopy/ EGD.  Called pt to explain.   Instructed to f/u with cardiology.  Advised her that she would need to get clearance from them prior to rescheduling.

## 2023-07-24 NOTE — Telephone Encounter (Signed)
PT is scheduled for EGD/colon today and has been wearing a heart monitor since 10/31 and cannot take it off until tomorrow. Please advise.

## 2023-07-24 NOTE — Progress Notes (Deleted)
[facility] History and Physical   Primary Care Physician:  Sandra Matar, MD   Reason for Procedure:  Chronic diarrhea, nausea and vomiting  Plan:    EGD and colonoscopy     HPI: Sandra Miller is a 47 y.o. female seen by Sandra Mulling, PA-C in September, with the following assessment and plan as below.  Her GI pathogen panel was negative.  Expand All Collapse All        06/02/2023 Sandra Miller [id number] 06-Nov-1975   Referring provider: Marcine Matar, MD Primary GI doctor: Dr. Leone Miller   ASSESSMENT AND PLAN:     Chronic Diarrhea New onset diarrhea following hospitalization for seizures. No blood in stool or abdominal pain. Possible GI losses contributing to low magnesium levels. -Collect stool sample for pathogen testing to rule out infection such as C. diff. -Trial of Imodium as needed. -Avoid dairy products. -Schedule colonoscopy and endoscopy to investigate possible causes, including sigmoid colon thickening noted on previous CT scan in August. We have discussed the risks of bleeding, infection, perforation, medication reactions, and remote risk of death associated with colonoscopy. All questions were answered and the patient acknowledges these risk and wishes to proceed.   Nausea/Vomiting Negative GES, CT unremarkable Chronic nausea and history of vomiting, improved since hospitalization. Possible functional, electrolyte abnormality -Refill Phenergan prescription, low dose with history of QT prolongation -Continue omeprazole 40mg  in the morning, 30 minutes to an hour before food. -Add famotidine 40mg  at bedtime. Will schedule EGD with colonoscopy. I discussed risks of EGD with patient today, including risk of sedation, bleeding or perforation.  Patient provides understanding and gave verbal consent to proceed. -consider baclofen   Seizures Recent onset of seizures, possibly related to low magnesium levels versus vasovagal. Currently  weaning off Keppra. -Continue current management under neurology. - should be acceptable at [facility], last seizure activity was in July, has not had any since. Will reach out to John to see if he can review chart and if she can be at [facility].    GERD History of GERD with recent increase in heartburn symptoms. -Continue omeprazole 40mg  in the morning, 30 minutes to an hour before food. -Add famotidine 40mg  at bedtime.     Past Medical History:  Diagnosis Date   Arthritis    Bipolar 1 disorder (HCC)    Cancer (HCC)    vulva, and breast   Complication of anesthesia    wakes up during procedures   Depression    GAD (generalized anxiety disorder)    Genital HSV    currently per pt  no break out 03-22-2016    GERD (gastroesophageal reflux disease)    Graves disease    Hiatal hernia    History of cervical dysplasia    2012 laser ablation   History of esophageal dilatation    for dysphasia -- x2 dilated   History of gastric ulcer    History of Helicobacter pylori infection    remote hx   History of hidradenitis suppurativa    "gets all over body intermittantly"     History of hypertension    no issue since stopped drinking alcohol 2014   History of kidney stones    History of panic attacks    History of radiation therapy 03/16/2020-05/08/2020   vulva  Dr Antony Blackbird   History of radiation therapy 09/09/2020-10/23/2020   left chest wall/left SCV   Dr Antony Blackbird   Hypertension    Iron deficiency anemia  Left ureteral stone    OCD (obsessive compulsive disorder)    PONV (postoperative nausea and vomiting)    Pre-diabetes    PTSD (post-traumatic stress disorder)    Recovering alcoholic in remission (HCC)    since 2014   RLS (restless legs syndrome)    Smokers' cough (HCC)    Thyroid disease    Urgency of urination    Yeast infection involving the vagina and surrounding area    secondary to taking antibiotic    Past Surgical History:  Procedure Laterality Date   CESAREAN SECTION   1995   w/  Bilateral Tubal Ligation   COLONOSCOPY  last one 08-09-2013   CYSTOSCOPY W/ URETERAL STENT PLACEMENT Left 03/29/2016   Procedure: CYSTOSCOPY WITH STENT REPLACEMENT;  Surgeon: Hildred Laser, MD;  Location: [facility];  Service: Urology;  Laterality: Left;   CYSTOSCOPY WITH RETROGRADE PYELOGRAM, URETEROSCOPY AND STENT PLACEMENT Left 03/08/2016   Procedure: CYSTOSCOPY WITH  LEFT RETROGRADE PYELOGRAM, AND STENT PLACEMENT;  Surgeon: Hildred Laser, MD;  Location: [facility];  Service: Urology;  Laterality: Left;   CYSTOSCOPY/RETROGRADE/URETEROSCOPY/STONE EXTRACTION WITH BASKET Left 03/29/2016   Procedure: CYSTOSCOPY/RETROGRADE/URETEROSCOPY/STONE EXTRACTION WITH BASKET;  Surgeon: Hildred Laser, MD;  Location: [facility];  Service: Urology;  Laterality: Left;   ENDOMETRIAL ABLATION W/ NOVASURE  04-01-2010   ESOPHAGOGASTRODUODENOSCOPY  last one 08-09-2013   KNEE ARTHROSCOPY Left as teen   LASER ABLATION OF THE CERVIX  2012 approx   MASTECTOMY WITH AXILLARY LYMPH NODE DISSECTION Left 07/27/2020   Procedure: LEFT MASTECTOMY WITH LEFT RADIOACTIVE SEED GUIDED TARGETED AXILLARY LYMPH NODE DISSECTION;  Surgeon: Ovidio Kin, MD;  Location: [facility];  Service: General;  Laterality: Left;   PORT-A-CATH REMOVAL Right 05/18/2021   Procedure: REMOVAL PORT-A-CATH;  Surgeon: Manus Rudd, MD;  Location: [facility];  Service: General;  Laterality: Right;   PORTACATH PLACEMENT Right 03/06/2020   Procedure: INSERTION PORT-A-CATH WITH ULTRASOUND GUIDANCE;  Surgeon: Ovidio Kin, MD;  [facility];  Service: General;  Laterality: Right;   ROBOTIC ASSISTED TOTAL HYSTERECTOMY WITH BILATERAL SALPINGO OOPHERECTOMY N/A 05/18/2021   Procedure: XI ROBOTIC ASSISTED TOTAL HYSTERECTOMY WITH BILATERAL SALPINGO OOPHORECTOMY;  Surgeon: Adolphus Birchwood, MD;  Location: [facility];  Service: Gynecology;  Laterality: N/A;   TRANSTHORACIC ECHOCARDIOGRAM  05-19-2006   lvsf normal,  ef 55-65%, there was mild flattening of the interventricular septum during diastoli/  RV size at upper limits normal   TUBAL LIGATION     VULVA /PERINEUM BIOPSY N/A 05/18/2021   Procedure: VULVAR BIOPSY;  Surgeon: Adolphus Birchwood, MD;  Location: [facility];  Service: Gynecology;  Laterality: N/A;   WISDOM TOOTH EXTRACTION  age 74 's    Prior to Admission medications   Medication Sig Start Date End Date Taking? Authorizing Provider  diclofenac Sodium (VOLTAREN) 1 % GEL Apply 4 grams topically 4  times daily as needed. Patient taking differently: Apply 4 g topically 4 (four) times daily as needed (pain). 03/30/21   Hilts, Casimiro Needle, MD  famotidine (PEPCID) 40 MG tablet Take 1 tablet (40 mg total) by mouth daily. 06/02/23   Doree Albee, PA-C  FLUoxetine (PROZAC) 10 MG capsule Take 3 capsules (30 mg total) by mouth daily. 06/22/23   Arfeen, Phillips Grout, MD  gabapentin (NEURONTIN) 300 MG capsule Take 1 capsule (300 mg) by mouth daily. 06/02/23   Doree Albee, PA-C  lamoTRIgine (LAMICTAL XR) 100 MG 24 hour tablet Take 1 tablet (100 mg total) by mouth at bedtime.  06/13/23   Anson Fret, MD  lamoTRIgine (LAMICTAL XR) 50 MG 24 hour tablet Take one 50mg  pill with your 100mg  pill once daily (for a total of 150mg  daily) for 2 weeks. If no side effects can increase to two 50mg  pills with your 100mg  pill for a total of 200mg  daily. If no side effects we can change to a 200mg  pill in the future. 07/24/23   Anson Fret, MD  letrozole [facility]) 2.5 MG tablet Take 1 tablet by mouth daily. 12/01/22   Serena Croissant, MD  levETIRAcetam (KEPPRA) 750 MG tablet Take 1 tablet (750 mg total) by mouth 2 (two) times daily. 05/01/23   Anson Fret, MD  levETIRAcetam (KEPPRA) 750 MG tablet Take 1 tablet (750 mg total) by mouth 2 (two) times daily. Taper off according to instructions given once you have reached goal dose of Lamotrigine. 06/01/23 07/01/23  Anson Fret, MD  lisinopril (ZESTRIL) 10 MG tablet Take 1 tablet (10  mg total) by mouth daily. Stop the Lisinopril/HCTZ combination 06/12/23   Sandra Matar, MD  loratadine (CLARITIN REDITABS) 10 MG dissolvable tablet Take 10 mg by mouth daily. OTC    [provider]  LORazepam (ATIVAN) 1 MG tablet Take 1 tablet (1 mg total) by mouth 3 (three) times daily as needed for anxiety 06/22/23   Arfeen, Phillips Grout, MD  Magnesium 400 MG TABS Take 800 mg by mouth 2 (two) times daily. 04/22/23   Sandra Matar, MD  methimazole (TAPAZOLE) 5 MG tablet Take one tablet (5 mg dose) by mouth daily. Patient taking differently: Take 5 mg by mouth daily. 09/22/22     Multiple Vitamin (MULTIVITAMIN WITH MINERALS) TABS tablet Take 1 tablet by mouth daily.    [provider]  Multiple Vitamins-Minerals (CERTA-VITE PO) Take by mouth daily. OTC    [provider]  omeprazole (PRILOSEC) 40 MG capsule Take 1 capsule (40 mg total) by mouth daily before breakfast. 06/02/23   Doree Albee, PA-C  potassium chloride SA (KLOR-CON M) 20 MEQ tablet Take 1 tablet (20 mEq) by mouth daily. 12/23/22   Sandra Matar, MD  promethazine (PHENERGAN) 12.5 MG tablet Take 1 tablet (12.5 mg total) by mouth every 8 (eight) hours as needed for nausea or vomiting. 06/02/23   Doree Albee, PA-C  propranolol (INDERAL) 10 MG tablet Take 1 tablet by mouth 2 times daily. 07/12/23   Sandra Matar, MD  spironolactone (ALDACTONE) 25 MG tablet Take 0.5 tablets (12.5 mg total) by mouth daily. 03/02/23   Sandra Matar, MD    Current Outpatient Medications  Medication Sig Dispense Refill   diclofenac Sodium (VOLTAREN) 1 % GEL Apply 4 grams topically 4  times daily as needed. (Patient taking differently: Apply 4 g topically 4 (four) times daily as needed (pain).) 500 g 6   famotidine (PEPCID) 40 MG tablet Take 1 tablet (40 mg total) by mouth daily. 90 tablet 3   FLUoxetine (PROZAC) 10 MG capsule Take 3 capsules (30 mg total) by mouth daily. 90 capsule 2   gabapentin (NEURONTIN)  300 MG capsule Take 1 capsule (300 mg) by mouth daily. 30 capsule 1   lamoTRIgine (LAMICTAL XR) 100 MG 24 hour tablet Take 1 tablet (100 mg total) by mouth at bedtime. 30 tablet 6   lamoTRIgine (LAMICTAL XR) 50 MG 24 hour tablet Take one 50mg  pill with your 100mg  pill once daily (for a total of 150mg  daily) for 2 weeks. If no side  effects can increase to two 50mg  pills with your 100mg  pill for a total of 200mg  daily. If no side effects we can change to a 200mg  pill in the future. 60 tablet 3   letrozole (FEMARA) 2.5 MG tablet Take 1 tablet by mouth daily. 90 tablet 3   levETIRAcetam (KEPPRA) 750 MG tablet Take 1 tablet (750 mg total) by mouth 2 (two) times daily. 180 tablet 3   levETIRAcetam (KEPPRA) 750 MG tablet Take 1 tablet (750 mg total) by mouth 2 (two) times daily. Taper off according to instructions given once you have reached goal dose of Lamotrigine. 60 tablet 0   lisinopril (ZESTRIL) 10 MG tablet Take 1 tablet (10 mg total) by mouth daily. Stop the Lisinopril/HCTZ combination 90 tablet 0   loratadine (CLARITIN REDITABS) 10 MG dissolvable tablet Take 10 mg by mouth daily. OTC     LORazepam (ATIVAN) 1 MG tablet Take 1 tablet (1 mg total) by mouth 3 (three) times daily as needed for anxiety 90 tablet 2   Magnesium 400 MG TABS Take 800 mg by mouth 2 (two) times daily. 120 tablet 1   methimazole (TAPAZOLE) 5 MG tablet Take one tablet (5 mg dose) by mouth daily. (Patient taking differently: Take 5 mg by mouth daily.) 90 tablet 3   Multiple Vitamin (MULTIVITAMIN WITH MINERALS) TABS tablet Take 1 tablet by mouth daily.     Multiple Vitamins-Minerals (CERTA-VITE PO) Take by mouth daily. OTC     omeprazole (PRILOSEC) 40 MG capsule Take 1 capsule (40 mg total) by mouth daily before breakfast. 90 capsule 3   potassium chloride SA (KLOR-CON M) 20 MEQ tablet Take 1 tablet (20 mEq) by mouth daily. 90 tablet 1   promethazine (PHENERGAN) 12.5 MG tablet Take 1 tablet (12.5 mg total) by mouth every 8 (eight)  hours as needed for nausea or vomiting. 30 tablet 1   propranolol (INDERAL) 10 MG tablet Take 1 tablet by mouth 2 times daily. 180 tablet 0   spironolactone (ALDACTONE) 25 MG tablet Take 0.5 tablets (12.5 mg total) by mouth daily. 60 tablet 1   No current facility-administered medications for this visit.    Allergies as of 07/24/2023 - Review Complete 07/06/2023  Allergen Reaction Noted   Dilaudid [hydromorphone hcl] Other (See Comments) 06/18/2020   Aspirin Hives    Depakote [divalproex sodium] Nausea And Vomiting 11/01/2016   Minocycline Hives 09/23/2011    Family History  Problem Relation Age of Onset   Heart disease Father    Lung cancer Father        d. 81   Alcohol abuse Father    Heart disease Mother    Depression Mother    Anxiety disorder Mother    Drug abuse Brother    Alcohol abuse Brother    Drug abuse Brother    ADD / ADHD Brother    Colon polyps Brother    Cancer Paternal Grandfather        "stomach"   Diabetes Maternal Grandfather    Diabetes Paternal Grandmother    Kidney disease Maternal Uncle    Cirrhosis Cousin        alcoholic   Anxiety disorder Maternal Aunt    Depression Maternal Aunt    Cancer Cousin        maternal; ovarian cancer or other "female" cancer?   Lung cancer Paternal Uncle 53   Throat cancer Cousin        paternal; dx 72s   Lung cancer Cousin  paternal; dx 44s    Social History   Socioeconomic History   Marital status: Single    Spouse name: Not on file   Number of children: Not on file   Years of education: Not on file   Highest education level: Not on file  Occupational History   Not on file  Tobacco Use   Smoking status: Former    Current packs/day: 0.50    Types: Cigarettes   Smokeless tobacco: Never   Tobacco comments:    Recently started a smoking cessation class. Quit 03/31/23  Vaping Use   Vaping status: Never Used  Substance and Sexual Activity   Alcohol use: Not Currently   Drug use: Not Currently     Types: Marijuana    Comment: none in about 1 year   Sexual activity: Yes    Partners: Male    Birth control/protection: Surgical  Other Topics Concern   Not on file  Social History Narrative   Former [facility] patient.      Was on disability at one point.   Return to the workforce.  40 hours a week at [employer], 10 hours a week on the weekends at [employer] in [place], [employer] at night 10 to 12 hours a week.      Has grown children, she lives alone with a pet, continues to smoke no alcohol or drug use at this time      History of EtOH abuse and THC use.         Social Determinants of Health   Financial Resource Strain: Medium Risk (01/24/2022)   Received from [facility], [facility]   Overall Financial Resource Strain (CARDIA)    Difficulty of Paying Living Expenses: Somewhat hard  Food Insecurity: No Food Insecurity (04/10/2023)   Hunger Vital Sign    Worried About Running Out of Food in the Last Year: Never true    Ran Out of Food in the Last Year: Never true  Transportation Needs: No Transportation Needs (04/10/2023)   PRAPARE - Administrator, Civil Service (Medical): No    Lack of Transportation (Non-Medical): No  Physical Activity: Inactive (01/24/2022)   [facility]   Exercise Vital Sign    Days of Exercise per Week: 0 days    Minutes of Exercise per Session: 0 min  Stress: Stress Concern Present (01/24/2022)   Received from [facility], [facility] - Occupational Stress Questionnaire    Feeling of Stress : Very much  Social Connections: Unknown (02/12/2023)   [facility]   Social Network    Social Network: Not on file  Intimate Partner Violence: Not At Risk (04/10/2023)   Humiliation, Afraid, Rape, and Kick questionnaire    Fear of Current or Ex-Partner: No    Emotionally Abused: No    Physically Abused: No    Sexually Abused:  No    Review of Systems: Positive for *** All other review of systems negative except as mentioned in the HPI.  Physical Exam: Vital signs There were no vitals taken for this visit.  General:   Alert,  Well-developed, well-nourished, pleasant and cooperative in NAD Lungs:  Clear throughout to auscultation.   Heart:  Regular rate and rhythm; no murmurs, clicks, rubs,  or gallops. Abdomen:  Soft, nontender and nondistended. Normal bowel sounds.   Neuro/Psych:  Alert and cooperative. Normal mood and affect. A and O  x 3   @Arlisha Patalano  Sena Slate, MD, [facility] [phone] (pager) 07/24/2023 12:50 PM@

## 2023-07-25 ENCOUNTER — Other Ambulatory Visit (HOSPITAL_COMMUNITY): Payer: Self-pay

## 2023-07-25 ENCOUNTER — Other Ambulatory Visit: Payer: Self-pay

## 2023-07-25 ENCOUNTER — Encounter (HOSPITAL_COMMUNITY): Payer: Self-pay

## 2023-07-25 MED ORDER — POTASSIUM CHLORIDE CRYS ER 20 MEQ PO TBCR
20.0000 meq | EXTENDED_RELEASE_TABLET | Freq: Every day | ORAL | 1 refills | Status: DC
  Filled 2023-07-25: qty 30, 30d supply, fill #0
  Filled 2023-08-25: qty 30, 30d supply, fill #1
  Filled 2023-10-17: qty 30, 30d supply, fill #2

## 2023-07-25 NOTE — Telephone Encounter (Signed)
Requested Prescriptions  Pending Prescriptions Disp Refills   potassium chloride SA (KLOR-CON M) 20 MEQ tablet 90 tablet 1    Sig: Take 1 tablet (20 mEq) by mouth daily.     Endocrinology:  Minerals - Potassium Supplementation Passed - 07/24/2023 12:25 PM      Passed - K in normal range and within 360 days    Potassium  Date Value Ref Range Status  07/06/2023 4.7 3.5 - 5.2 mmol/L Final         Passed - Cr in normal range and within 360 days    Creatinine  Date Value Ref Range Status  04/06/2021 0.75 0.44 - 1.00 mg/dL Final   Creat  Date Value Ref Range Status  11/10/2016 1.01 0.50 - 1.10 mg/dL Final   Creatinine, Ser  Date Value Ref Range Status  07/06/2023 0.80 0.57 - 1.00 mg/dL Final   Creatinine,U  Date Value Ref Range Status  12/02/2009 46.6 mg/dL Final    Comment:    See lab report for associated comment(s)         Passed - Valid encounter within last 12 months    Recent Outpatient Visits           1 month ago Gait disturbance   [facility] [place]. [facility] Marcine Matar, MD   3 months ago Hospital discharge follow-up   [facility] [place]. [facility] Marcine Matar, MD   7 months ago Essential hypertension   [place] [place] [facility] [place] [facility] [place]. [employer] Marcine Matar, MD   8 months ago Gastroenteritis   [place] [facility] [place]. [facility] Hoy Register, MD   11 months ago Annual physical exam   [place] [facility] [place] [facility] [place]. [facility] Marcine Matar, MD       Future Appointments             In 1 month Laural Benes Binnie Rail, MD [facility] [place] [facility] Of [place]. [facility]   In 2 months Butch Penny, NP [facility]   In 3 months Little Ishikawa, MD [facility]  [facility]

## 2023-07-26 ENCOUNTER — Other Ambulatory Visit: Payer: Self-pay

## 2023-07-26 ENCOUNTER — Other Ambulatory Visit (HOSPITAL_COMMUNITY): Payer: Self-pay

## 2023-07-26 ENCOUNTER — Encounter (HOSPITAL_COMMUNITY): Payer: Self-pay

## 2023-07-27 ENCOUNTER — Other Ambulatory Visit: Payer: Self-pay

## 2023-07-31 ENCOUNTER — Encounter: Payer: Self-pay | Admitting: Hematology and Oncology

## 2023-08-01 ENCOUNTER — Encounter: Payer: Self-pay | Admitting: Internal Medicine

## 2023-08-01 ENCOUNTER — Encounter: Payer: Self-pay | Admitting: Hematology and Oncology

## 2023-08-01 ENCOUNTER — Ambulatory Visit: Payer: No Typology Code available for payment source | Attending: Internal Medicine | Admitting: Internal Medicine

## 2023-08-01 VITALS — BP 134/84 | HR 91 | Temp 98.7°F | Ht 66.0 in | Wt 195.0 lb

## 2023-08-01 DIAGNOSIS — F411 Generalized anxiety disorder: Secondary | ICD-10-CM

## 2023-08-01 DIAGNOSIS — R29898 Other symptoms and signs involving the musculoskeletal system: Secondary | ICD-10-CM

## 2023-08-01 DIAGNOSIS — F321 Major depressive disorder, single episode, moderate: Secondary | ICD-10-CM | POA: Diagnosis not present

## 2023-08-01 DIAGNOSIS — Z23 Encounter for immunization: Secondary | ICD-10-CM

## 2023-08-01 NOTE — Progress Notes (Signed)
Patient ID: Sandra Miller, female    DOB: 1975/11/15  MRN: [id number]  CC: Fatigue (Increased fatigue - feels that she went back to work too early. Sandra Miller that she is having trouble with her upper body strength - causing anxiety / stress /Yes to tdap vax. Awaiting clearance from cardio to proceed with colonoscopy )   Subjective: Sandra Miller is a 47 y.o. female who presents for chronic ds management. Her concerns today include:  Patient with history of HTN, HL, preDM, hypokalemia,  obesity, tob dep, RLS, DVT of the RT IJV and surrounding IJ port 04/2020 GERD/functional dyspepsia (on gabapentin and high-dose omeprazole), hidradenitis suppurativa, breast CA left dx 02/2020 (ERP s/p left mastectomy, neoadjuvant chemotherapy and adjuvant XRT; Dr. Pamelia Miller).  Vulvar cancer (dx 01/2020), HSV infection, genitial warts, bipolar disorder/dep/anx (Dr. Lolly Miller)   Discussed the use of AI scribe software for clinical note transcription with the patient, who gave verbal consent to proceed.  History of Present Illness   The patient, a CNA with a history of seizures, bipolar disorder, and cancer, presents with ongoing weakness and fatigue since sz episode this past summer.  She returned to work on the 21st of this month and was excited to do so.  However she reports a significant decrease in upper body strength, which has affected her ability to perform her job duties, such as lifting and moving patients.  She had to constantly ask for help when trying to move patients.  This has caused significant distress and has impacted her mental health.  She feels overwhelmed.  She inquired about light duty but was informed that when she returns to work she has to come back without restrictions.  She was told that she could work part-time but does not want to do so because she will lose her private insurance that she has through work and she would also make too much to maintain her Medicaid.  She feels that her time out of  work needs to be extended.  She tells me that she has also applied for disability upon the advice of her psychiatrist Dr. Lolly Miller.  She has already applied.  However she states that she really does not want to give up as yet on being able to return to work.  The patient has been receiving B12 injections for the past four weeks from her oncologist to see if it would help decrease fatigue but reports no improvement. She is on Lamictal for bipolar and seizure control, which was increased by her neurologist in an attempt to wean her off Keppra. Her psychiatrist supported this increase due to her mood disorder.  She was referred to P.T but  has had difficulty scheduling appointments due to transportation issues and her inability to drive for six months post-seizure. She expresses concern about her mother's ability to continue providing transportation due to her age and recent memory issues.  The patient also mentions an upcoming appointment with her neurologist next mth, who she hopes will clear her to drive by Christmas.   She was scheduled for a colonoscopy and endoscopy but this was delayed by the gastroenterologist until she completes workup with the cardiologist.  She is wearing a Holter monitor.   Patient Active Problem List   Diagnosis Date Noted   Mood disorder (HCC) 05/29/2023   Fatigue 05/15/2023   Prolonged QT interval 04/10/2023   Dizziness 04/10/2023   Provoked seizures (HCC) 03/31/2023   Respiratory depression 03/31/2023   Status epilepticus (HCC) 03/31/2023  Migraine with aura and without status migrainosus, not intractable 10/03/2022   Pain in right knee 09/07/2022   Bilateral primary osteoarthritis of knee 09/07/2022   Chronic migraine without aura without status migrainosus, not intractable 07/07/2022   Graves disease 05/10/2022   Sebaceous cyst 12/06/2021   Pain in left knee 11/03/2021   Hyperthyroidism 08/13/2021   Bipolar 1 disorder (HCC) 05/07/2021   Obesity (BMI  30.0-34.9) 05/07/2021   Hyperlipidemia 08/12/2020   Prediabetes 08/12/2020   Hypomagnesemia 08/12/2020   Mutation in BRIP1 gene 06/24/2020   Hypokalemia 06/17/2020   Dehydration    Colitis 06/16/2020   Port-A-Cath in place 03/11/2020   Malignant neoplasm of upper-inner quadrant of left breast in female, estrogen receptor positive (HCC) 02/25/2020   Vulvar cancer (HCC) 01/29/2020   Vulvar ulceration 01/23/2020   Essential hypertension 10/31/2019   Nausea and vomiting 12/27/2018   Seasonal allergic rhinitis 12/27/2018   History of ELISA positive for HSV 10/01/2018   Restless leg syndrome 04/01/2016   Nephrolithiasis 03/08/2016   Obstructive pyelonephritis 03/08/2016   Normocytic anemia 03/08/2016   Hidradenitis suppurativa of left axilla 11/03/2015   Loss of weight 09/08/2015   Neck pain on left side 10/22/2014   Genital warts 05/20/2014   GERD (gastroesophageal reflux disease) 12/26/2012   Functional dyspepsia 12/26/2012   Tobacco abuse 08/22/2012   Herpes simplex virus (HSV) infection 02/03/2007     Current Outpatient Medications on File Prior to Visit  Medication Sig Dispense Refill   diclofenac Sodium (VOLTAREN) 1 % GEL Apply 4 grams topically 4  times daily as needed. (Patient taking differently: Apply 4 g topically 4 (four) times daily as needed (pain).) 500 g 6   famotidine (PEPCID) 40 MG tablet Take 1 tablet (40 mg total) by mouth daily. 90 tablet 3   FLUoxetine (PROZAC) 10 MG capsule Take 3 capsules (30 mg total) by mouth daily. 90 capsule 2   gabapentin (NEURONTIN) 300 MG capsule Take 1 capsule (300 mg) by mouth daily. 30 capsule 1   lamoTRIgine (LAMICTAL XR) 100 MG 24 hour tablet Take 1 tablet (100 mg total) by mouth at bedtime. 30 tablet 6   lamoTRIgine (LAMICTAL XR) 50 MG 24 hour tablet Take one 50mg  pill with your 100mg  pill once daily (for a total of 150mg  daily) for 2 weeks. If no side effects can increase to two 50mg  pills with your 100mg  pill for a total of 200mg   daily. If no side effects we can change to a 200mg  pill in the future. 60 tablet 3   letrozole (FEMARA) 2.5 MG tablet Take 1 tablet by mouth daily. 90 tablet 3   levETIRAcetam (KEPPRA) 750 MG tablet Take 1 tablet (750 mg total) by mouth 2 (two) times daily. 180 tablet 3   lisinopril (ZESTRIL) 10 MG tablet Take 1 tablet (10 mg total) by mouth daily. Stop the Lisinopril/HCTZ combination 90 tablet 0   loratadine (CLARITIN REDITABS) 10 MG dissolvable tablet Take 10 mg by mouth daily. OTC     LORazepam (ATIVAN) 1 MG tablet Take 1 tablet (1 mg total) by mouth 3 (three) times daily as needed for anxiety 90 tablet 2   Magnesium 400 MG TABS Take 800 mg by mouth 2 (two) times daily. 120 tablet 1   methimazole (TAPAZOLE) 5 MG tablet Take one tablet (5 mg dose) by mouth daily. (Patient taking differently: Take 5 mg by mouth daily.) 90 tablet 3   Multiple Vitamin (MULTIVITAMIN WITH MINERALS) TABS tablet Take 1 tablet by mouth daily.  Multiple Vitamins-Minerals (CERTA-VITE PO) Take by mouth daily. OTC     omeprazole (PRILOSEC) 40 MG capsule Take 1 capsule (40 mg total) by mouth daily before breakfast. 90 capsule 3   potassium chloride SA (KLOR-CON M) 20 MEQ tablet Take 1 tablet (20 mEq) by mouth daily. 90 tablet 1   promethazine (PHENERGAN) 12.5 MG tablet Take 1 tablet (12.5 mg total) by mouth every 8 (eight) hours as needed for nausea or vomiting. 30 tablet 1   propranolol (INDERAL) 10 MG tablet Take 1 tablet by mouth 2 times daily. 180 tablet 0   spironolactone (ALDACTONE) 25 MG tablet Take 0.5 tablets (12.5 mg total) by mouth daily. 60 tablet 1   levETIRAcetam (KEPPRA) 750 MG tablet Take 1 tablet (750 mg total) by mouth 2 (two) times daily. Taper off according to instructions given once you have reached goal dose of Lamotrigine. 60 tablet 0   No current facility-administered medications on file prior to visit.    Allergies  Allergen Reactions   Dilaudid [Hydromorphone Hcl] Other (See Comments)    Pt  became confused, pulled out iv's, does not remember anything   Aspirin Hives    States able to tolerate Goody Powders and Ibuprofen without any problem    Depakote [Divalproex Sodium] Nausea And Vomiting   Minocycline Hives    Social History   Socioeconomic History   Marital status: Single    Spouse name: Not on file   Number of children: Not on file   Years of education: Not on file   Highest education level: Not on file  Occupational History   Not on file  Tobacco Use   Smoking status: Former    Current packs/day: 0.50    Types: Cigarettes   Smokeless tobacco: Never   Tobacco comments:    Recently started a smoking cessation class. Quit 03/31/23  Vaping Use   Vaping status: Never Used  Substance and Sexual Activity   Alcohol use: Not Currently   Drug use: Not Currently    Types: Marijuana    Comment: none in about 1 year   Sexual activity: Yes    Partners: Male    Birth control/protection: Surgical  Other Topics Concern   Not on file  Social History Narrative   Former [facility] patient.      Was on disability at one point.   Return to the workforce.  40 hours a week at [employer], 10 hours a week on the weekends at [employer] in [place], [employer] at night 10 to 12 hours a week.      Has grown children, she lives alone with a pet, continues to smoke no alcohol or drug use at this time      History of EtOH abuse and THC use.         Social Determinants of Health   Financial Resource Strain: Medium Risk (08/01/2023)   Overall Financial Resource Strain (CARDIA)    Difficulty of Paying Living Expenses: Somewhat hard  Food Insecurity: No Food Insecurity (08/01/2023)   Hunger Vital Sign    Worried About Running Out of Food in the Last Year: Never true    Ran Out of Food in the Last Year: Never true  Transportation Needs: No Transportation Needs (08/01/2023)   PRAPARE - Administrator, Civil Service (Medical): No    Lack of Transportation  (Non-Medical): No  Physical Activity: Inactive (08/01/2023)   Exercise Vital Sign    Days of Exercise per Week:  0 days    Minutes of Exercise per Session: 0 min  Stress: Stress Concern Present (08/01/2023)   [employer] of Occupational Health - Occupational Stress Questionnaire    Feeling of Stress : Rather much  Social Connections: Socially Isolated (08/01/2023)   Social Connection and Isolation Panel [NHANES]    Frequency of Communication with Friends and Family: Three times a week    Frequency of Social Gatherings with Friends and Family: Never    Attends Religious Services: Never    Database administrator or Organizations: No    Attends Banker Meetings: Never    Marital Status: Never married  Intimate Partner Violence: Not At Risk (08/01/2023)   Humiliation, Afraid, Rape, and Kick questionnaire    Fear of Current or Ex-Partner: No    Emotionally Abused: No    Physically Abused: No    Sexually Abused: No    Family History  Problem Relation Age of Onset   Heart disease Father    Lung cancer Father        d. 29   Alcohol abuse Father    Heart disease Mother    Depression Mother    Anxiety disorder Mother    Drug abuse Brother    Alcohol abuse Brother    Drug abuse Brother    ADD / ADHD Brother    Colon polyps Brother    Cancer Paternal Grandfather        "stomach"   Diabetes Maternal Grandfather    Diabetes Paternal Grandmother    Kidney disease Maternal Uncle    Cirrhosis Cousin        alcoholic   Anxiety disorder Maternal Aunt    Depression Maternal Aunt    Cancer Cousin        maternal; ovarian cancer or other "female" cancer?   Lung cancer Paternal Uncle 75   Throat cancer Cousin        paternal; dx 68s   Lung cancer Cousin        paternal; dx 35s    Past Surgical History:  Procedure Laterality Date   CESAREAN SECTION  1995   w/  Bilateral Tubal Ligation   COLONOSCOPY  last one 08-09-2013   CYSTOSCOPY W/ URETERAL STENT PLACEMENT  Left 03/29/2016   Procedure: CYSTOSCOPY WITH STENT REPLACEMENT;  Surgeon: Hildred Laser, MD;  Location: [facility];  Service: Urology;  Laterality: Left;   CYSTOSCOPY WITH RETROGRADE PYELOGRAM, URETEROSCOPY AND STENT PLACEMENT Left 03/08/2016   Procedure: CYSTOSCOPY WITH  LEFT RETROGRADE PYELOGRAM, AND STENT PLACEMENT;  Surgeon: Hildred Laser, MD;  Location: [facility];  Service: Urology;  Laterality: Left;   CYSTOSCOPY/RETROGRADE/URETEROSCOPY/STONE EXTRACTION WITH BASKET Left 03/29/2016   Procedure: CYSTOSCOPY/RETROGRADE/URETEROSCOPY/STONE EXTRACTION WITH BASKET;  Surgeon: Hildred Laser, MD;  Location: [facility];  Service: Urology;  Laterality: Left;   ENDOMETRIAL ABLATION W/ NOVASURE  04-01-2010   ESOPHAGOGASTRODUODENOSCOPY  last one 08-09-2013   KNEE ARTHROSCOPY Left as teen   LASER ABLATION OF THE CERVIX  2012 approx   MASTECTOMY WITH AXILLARY LYMPH NODE DISSECTION Left 07/27/2020   Procedure: LEFT MASTECTOMY WITH LEFT RADIOACTIVE SEED GUIDED TARGETED AXILLARY LYMPH NODE DISSECTION;  Surgeon: Ovidio Kin, MD;  Location: [facility];  Service: General;  Laterality: Left;   PORT-A-CATH REMOVAL Right 05/18/2021   Procedure: REMOVAL PORT-A-CATH;  Surgeon: Manus Rudd, MD;  Location: [facility];  Service: General;  Laterality: Right;   PORTACATH PLACEMENT Right 03/06/2020   Procedure: INSERTION PORT-A-CATH WITH ULTRASOUND  GUIDANCE;  Surgeon: Ovidio Kin, MD;  Location: [facility];  Service: General;  Laterality: Right;   ROBOTIC ASSISTED TOTAL HYSTERECTOMY WITH BILATERAL SALPINGO OOPHERECTOMY N/A 05/18/2021   Procedure: XI ROBOTIC ASSISTED TOTAL HYSTERECTOMY WITH BILATERAL SALPINGO OOPHORECTOMY;  Surgeon: Adolphus Birchwood, MD;  Location: [facility];  Service: Gynecology;  Laterality: N/A;   TRANSTHORACIC ECHOCARDIOGRAM  05-19-2006   lvsf normal, ef 55-65%, there was mild flattening of the interventricular septum during diastoli/  RV size at upper limits normal    TUBAL LIGATION     VULVA /PERINEUM BIOPSY N/A 05/18/2021   Procedure: VULVAR BIOPSY;  Surgeon: Adolphus Birchwood, MD;  Location: [facility];  Service: Gynecology;  Laterality: N/A;   WISDOM TOOTH EXTRACTION  age 21 's    ROS: Review of Systems Negative except as stated above  PHYSICAL EXAM: BP 134/84 (BP Location: Right Arm, Patient Position: Sitting, Cuff Size: Normal)   Pulse 91   Temp 98.7 F (37.1 C) (Oral)   Ht 5\' 6"  (1.676 m)   Wt 195 lb (88.5 kg)   SpO2 100%   BMI 31.47 kg/m   Physical Exam  General appearance - alert, well appearing, and in no distress Mental status -patient appeared anxious when I first walked in the room.  She was shaking her legs nervously but eventually stopped Neurological -power: Grip 4/5 bilaterally.  Power in both upper extremities 5/5 proximally and distally.  Gross sensation appears intact.  Gait is steady and stable with good foot floor clearance.  She ambulates unassisted.     08/01/2023    3:53 PM 06/07/2023   10:29 AM 06/06/2023    2:49 PM  Depression screen PHQ 2/9  Decreased Interest 3 3 3   Down, Depressed, Hopeless 3 3 3   PHQ - 2 Score 6 6 6   Altered sleeping 3 0 0  Tired, decreased energy 3 2 2   Change in appetite 3 2 2   Feeling bad or failure about yourself  3 3 3   Trouble concentrating 3 3 3   Moving slowly or fidgety/restless 0 0 0  Suicidal thoughts 0 3 3  PHQ-9 Score 21 19 19   Difficult doing work/chores Extremely dIfficult Extremely dIfficult Extremely dIfficult      08/01/2023    3:53 PM 06/07/2023   10:30 AM 06/06/2023    2:50 PM 04/14/2023   10:40 AM  GAD 7 : Generalized Anxiety Score  Nervous, Anxious, on Edge 3 3 3 3   Control/stop worrying 3 3 3 3   Worry too much - different things 3 3 3 3   Trouble relaxing 3 3 3 3   Restless 1 0 3 1  Easily annoyed or irritable 3 3 3 3   Afraid - awful might happen 3 3 3 3   Total GAD 7 Score 19 18 21 19   Anxiety Difficulty Extremely difficult Extremely difficult Not difficult at all          Latest Ref Rng & Units 07/06/2023    3:37 PM 06/02/2023    2:55 PM 04/21/2023    2:00 PM  CMP  Glucose 70 - 99 mg/dL 97  098    BUN 6 - 24 mg/dL 15  15    Creatinine 1.19 - 1.00 mg/dL 1.47  8.29    Sodium 562 - 144 mmol/L 138  135    Potassium 3.5 - 5.2 mmol/L 4.7  4.3    Chloride 96 - 106 mmol/L 100  100    CO2 20 - 29 mmol/L 22  26  Calcium 8.7 - 10.2 mg/dL 16.1  09.6  04.5   Total Protein 6.0 - 8.5 g/dL 7.7  8.0    Total Bilirubin 0.0 - 1.2 mg/dL 0.3  0.5    Alkaline Phos 44 - 121 IU/L 92  56    AST 0 - 40 IU/L 17  16    ALT 0 - 32 IU/L 13  10     Lipid Panel     Component Value Date/Time   CHOL 159 08/18/2022 1605   TRIG 120 08/18/2022 1605   HDL 51 08/18/2022 1605   CHOLHDL 3.1 08/18/2022 1605   CHOLHDL 4.7 Ratio 06/13/2008 2303   VLDL 32 06/13/2008 2303   LDLCALC 87 08/18/2022 1605    CBC    Component Value Date/Time   WBC 8.8 06/02/2023 1455   RBC 4.53 06/02/2023 1455   HGB 13.1 06/02/2023 1455   HGB 12.9 11/22/2022 1633   HCT 40.5 06/02/2023 1455   HCT 38.6 11/22/2022 1633   PLT 339.0 06/02/2023 1455   PLT 318 11/22/2022 1633   MCV 89.5 06/02/2023 1455   MCV 89 11/22/2022 1633   MCH 30.2 04/12/2023 0046   MCHC 32.2 06/02/2023 1455   RDW 13.7 06/02/2023 1455   RDW 13.6 11/22/2022 1633   LYMPHSABS 3.1 06/02/2023 1455   LYMPHSABS 3.9 (H) 11/22/2022 1633   MONOABS 0.6 06/02/2023 1455   EOSABS 0.1 06/02/2023 1455   EOSABS 0.1 11/22/2022 1633   BASOSABS 0.1 06/02/2023 1455   BASOSABS 0.0 11/22/2022 1633    ASSESSMENT AND PLAN: 1. Weakness of both hands Gait and strength in the lower extremities have improved.  She still has some residual weakness in the upper extremity mainly with grip.  We will refer her back to physical therapy.  Will hold her out of work until January 31.  Advised patient to look into transportation that may be provided through Medicaid so that she can get to and from physical therapy sessions.  Note for work written.  -  Ambulatory referral to Physical Therapy  2. GAD (generalized anxiety disorder) 3. Moderate major depression (HCC) I think she would most likely qualify for disability based on her mental health.  She has had severe anxiety and significant depression ever since she was diagnosed with breast and vulvar cancer in 2021. She continues to take her psychiatric medications as prescribed by Dr. Lolly Miller.  Patient was given the opportunity to ask questions.  Patient verbalized understanding of the plan and was able to repeat key elements of the plan.   This documentation was completed using Paediatric nurse.  Any transcriptional errors are unintentional.  Orders Placed This Encounter  Procedures   Ambulatory referral to Physical Therapy     Requested Prescriptions    No prescriptions requested or ordered in this encounter    No follow-ups on file.  Jonah Blue, MD, FACP

## 2023-08-01 NOTE — Patient Instructions (Signed)
Please look into medical transportation to take you to physical therapy sessions. We will keep you out of work until 10/06/2023.

## 2023-08-02 ENCOUNTER — Telehealth (HOSPITAL_COMMUNITY): Payer: Self-pay | Admitting: *Deleted

## 2023-08-02 NOTE — Telephone Encounter (Signed)
Pt called with c/o increased anxiety and depression. Pt denies SI or thoughts of self harm. Pt saw her PCP on 08/01/23 and was advised to contact our office. Pt last visit was on 06/22/23 and hse has a f/u scheduled on 09/22/23. Please review and advise.

## 2023-08-07 ENCOUNTER — Encounter: Payer: Self-pay | Admitting: Hematology and Oncology

## 2023-08-07 ENCOUNTER — Other Ambulatory Visit: Payer: Self-pay

## 2023-08-07 ENCOUNTER — Other Ambulatory Visit (HOSPITAL_COMMUNITY): Payer: Self-pay

## 2023-08-09 ENCOUNTER — Other Ambulatory Visit: Payer: Self-pay

## 2023-08-09 ENCOUNTER — Other Ambulatory Visit (HOSPITAL_COMMUNITY): Payer: Self-pay

## 2023-08-10 ENCOUNTER — Inpatient Hospital Stay: Payer: PRIVATE HEALTH INSURANCE | Attending: Hematology and Oncology

## 2023-08-10 VITALS — BP 128/91 | HR 93 | Temp 98.7°F | Resp 16

## 2023-08-10 DIAGNOSIS — C50212 Malignant neoplasm of upper-inner quadrant of left female breast: Secondary | ICD-10-CM | POA: Insufficient documentation

## 2023-08-10 DIAGNOSIS — R5383 Other fatigue: Secondary | ICD-10-CM | POA: Diagnosis present

## 2023-08-10 DIAGNOSIS — T733XXA Exhaustion due to excessive exertion, initial encounter: Secondary | ICD-10-CM

## 2023-08-10 MED ORDER — CYANOCOBALAMIN 1000 MCG/ML IJ SOLN
1000.0000 ug | Freq: Once | INTRAMUSCULAR | Status: AC
  Administered 2023-08-10: 1000 ug via INTRAMUSCULAR

## 2023-08-12 ENCOUNTER — Other Ambulatory Visit: Payer: Self-pay | Admitting: Physician Assistant

## 2023-08-12 DIAGNOSIS — R1013 Epigastric pain: Secondary | ICD-10-CM

## 2023-08-14 ENCOUNTER — Other Ambulatory Visit (HOSPITAL_COMMUNITY): Payer: Self-pay

## 2023-08-14 ENCOUNTER — Other Ambulatory Visit: Payer: Self-pay | Admitting: Physician Assistant

## 2023-08-14 ENCOUNTER — Other Ambulatory Visit: Payer: Self-pay

## 2023-08-14 DIAGNOSIS — R1013 Epigastric pain: Secondary | ICD-10-CM

## 2023-08-14 MED ORDER — GABAPENTIN 300 MG PO CAPS
300.0000 mg | ORAL_CAPSULE | Freq: Every day | ORAL | 1 refills | Status: DC
  Filled 2023-08-14: qty 30, 30d supply, fill #0
  Filled 2023-09-09 – 2023-09-13 (×3): qty 30, 30d supply, fill #1

## 2023-08-15 ENCOUNTER — Encounter: Payer: Self-pay | Admitting: Hematology and Oncology

## 2023-08-22 ENCOUNTER — Other Ambulatory Visit: Payer: Self-pay

## 2023-08-22 ENCOUNTER — Telehealth: Payer: PRIVATE HEALTH INSURANCE | Admitting: Neurology

## 2023-08-22 ENCOUNTER — Telehealth: Payer: Self-pay | Admitting: Neurology

## 2023-08-22 ENCOUNTER — Encounter: Payer: Self-pay | Admitting: Neurology

## 2023-08-22 ENCOUNTER — Other Ambulatory Visit (HOSPITAL_COMMUNITY): Payer: Self-pay

## 2023-08-22 DIAGNOSIS — R569 Unspecified convulsions: Secondary | ICD-10-CM | POA: Diagnosis not present

## 2023-08-22 DIAGNOSIS — G43711 Chronic migraine without aura, intractable, with status migrainosus: Secondary | ICD-10-CM

## 2023-08-22 DIAGNOSIS — R400 Somnolence: Secondary | ICD-10-CM | POA: Diagnosis not present

## 2023-08-22 MED ORDER — AJOVY 225 MG/1.5ML ~~LOC~~ SOAJ: 225.0000 mg | SUBCUTANEOUS | Status: DC

## 2023-08-22 MED ORDER — UBRELVY 50 MG PO TABS: ORAL_TABLET | ORAL | Status: DC

## 2023-08-22 MED ORDER — LAMOTRIGINE ER 200 MG PO TB24
200.0000 mg | ORAL_TABLET | Freq: Every evening | ORAL | 4 refills | Status: DC
  Filled 2023-08-22: qty 30, 30d supply, fill #0
  Filled 2023-09-17: qty 30, 30d supply, fill #1
  Filled 2023-10-17: qty 30, 30d supply, fill #2
  Filled 2023-11-15 – 2023-11-18 (×2): qty 30, 30d supply, fill #3
  Filled 2023-12-16: qty 30, 30d supply, fill #4

## 2023-08-22 MED ORDER — AJOVY 225 MG/1.5ML ~~LOC~~ SOAJ
225.0000 mg | SUBCUTANEOUS | 11 refills | Status: DC
  Filled 2023-08-22: qty 1.5, 30d supply, fill #0
  Filled 2023-10-08: qty 1.5, 30d supply, fill #1
  Filled 2023-12-07: qty 1.5, 30d supply, fill #2

## 2023-08-22 NOTE — Progress Notes (Signed)
[facility]    Provider:  Dr [name] Requesting Provider: Marcine Matar, MD Primary Care Provider:  Marcine Matar, MD  CC:  headaches, recent provoked seizure  Virtual Visit via Video Note  I connected with Sandra Miller on 08/22/23 at 11:30 AM EST by a video enabled telemedicine application and verified that I am speaking with the correct person using two identifiers.  Location: Patient: home Provider: office   I discussed the limitations of evaluation and management by telemedicine and the availability of in person appointments. The patient expressed understanding and agreed to proceed.    Follow Up Instructions:    I discussed the assessment and treatment plan with the patient. The patient was provided an opportunity to ask questions and all were answered. The patient agreed with the plan and demonstrated an understanding of the instructions.   The patient was advised to call back or seek an in-person evaluation if the symptoms worsen or if the condition fails to improve as anticipated.  I provided over 40 minutes of non-face-to-face time during this encounter.   Anson Fret, MD   08/22/2023: Snores heavily. Wakes up mornings feeling unrefreshed. She spoke to her oncologist and since chemo she has no enerfy. She was started on B12 shots weekly then monthly since November. She is on 150mg  of maictal and heading to 200. I will send in a 200mg  pill XR and start decreasing keppra. SInce this was a provoked seizure and has been 6 months seizure free and is on 2 seizure medications(on gabapentin and lamictal and is not tirating down on the keppra but she will stay on lamictal and gabapentin) she can be released to drive.  The seizure was provoke din the setting of vomiting for days, hypomagnesemia, elevated WBCs 25, long-term eeg in the hospital was negative, believe this was provoked and the metabolic abnormalities are now normal so she is on and  will stay on gabapentin and lamictal. She will start with short drives around the block with daughter for a few weeks and in January if feeling comfortable by 14th can start driving independently.  Having migraines again.  She has 15 migraine days a month lasting 8-24 hours and daily headaches for > 6 months, pulsating/pounding/throbbing, nausea, photo/phono/osmophobia, no medication overuse, no aura, hurts to move, moderate to severe and affecting life. Discussed sleep apnea can consider in the future if the cgrp not effective but int he past the AJOVY was tremendouns and she had > 75% improvement in migraine freq and severity. And when < 8 migraines she will contact us to prescribe ubrelvy. Consider sleep eval, highly recommend. No further seizure activity. No other focal neurologic deficits, associated symptoms, inciting events or modifiable factors.   Medications tried > 3 months that can be used in migraine management: lamictal IR and ER, Keppra, Topiramate, lisonopril, propranolol, Tylenol, baclofen,voltaren, benadryl, prozac, gabapentin, ibupren, ketoralc, labetalol, lisinopril, magnesium, methimazole,robaxin, reglan, medrol, zyprexa, prednisone, compazine, propranolol,has been on topamx also  topiramate contraindicated due to kidney stones, trazodone, effexor,amitriptyline and nortrityline, imitrex and maxalt(palpitations), aimovig contraindicated due to constipation, Ajovy(worked but insurance problems), Bernita Raisin, emgality  Patient complains of symptoms per HPI as well as the following symptoms: none . Pertinent negatives and positives per HPI. All others negative   05/29/2023: She has been fatigues since being I the hospital. Magnesium was still low on Dr. Henriette Combs labs last month, she had repeat at mephrology. She has an appointment with Dr. Laural Benes (advised her I didn;t see a  more recent serum magnesium discuss with Dr. Laural Benes). Fatigued. No seizures since she ws last seen. Was started on  Lamictal for her mood and discussed with Dr. Lolly Mustache to keep her on Lamictal and titrate off of Keppra as lamictal is also a seizure medication. Dr. Georgiann Mohs gave her b12 injections, She also see cardiology next month. 2 AEDs may be causing fatigue. If we can get lamictal up to 100mg  bid we can start to decrease keppra. Next week she start taking 4 lamictal once a day. She is scared to decrease keppra due to her anxiety told her we could start decreasing keppra at this time while we titrate the lamictal but she is scared and I completely understand.   04/26/2023: Migraine patient who is here for follow up from hospital. has Herpes simplex virus (HSV) infection; Tobacco abuse; GERD (gastroesophageal reflux disease); Functional dyspepsia; Genital warts; Neck pain on left side; Loss of weight; Hidradenitis suppurativa of left axilla; Nephrolithiasis; Obstructive pyelonephritis; Normocytic anemia; Restless leg syndrome; History of ELISA positive for HSV; Nausea and vomiting; Seasonal allergic rhinitis; Essential hypertension; Vulvar ulceration; Vulvar cancer (HCC); Malignant neoplasm of upper-inner quadrant of left breast in female, estrogen receptor positive (HCC); Port-A-Cath in place; Colitis; Hypokalemia; Dehydration; Mutation in BRIP1 gene; Hyperlipidemia; Prediabetes; Hypomagnesemia; Bipolar 1 disorder (HCC); Obesity (BMI 30.0-34.9); Hyperthyroidism; Pain in left knee; Sebaceous cyst; Chronic migraine without aura without status migrainosus, not intractable; Pain in right knee; Bilateral primary osteoarthritis of knee; Migraine with aura and without status migrainosus, not intractable; Provoked seizures (HCC); Respiratory depression; Status epilepticus (HCC); Prolonged QT interval; Graves disease; Dizziness; Fatigue; Mood disorder (HCC); and Chronic migraine without aura, with intractable migraine, so stated, with status migrainosus on their problem list.  She has a hx of bipolar 1, cancer s/p chemo, radiation  therapy.  Tobacco abuse, depression, anxiety, migraines, panic attacks, PTSD, obesity, gastric ulcer, Graves' disease, nephrolithiasis, hypertension, prediabetes was seen in the hospital for possible seizures.  I reviewed discharge summaries from Dr. Randol Kern and Dr. Dayna Barker: She was found minimally responsive on the floor by her daughter on March 31, 2019 4 in the morning, patient had had intermittent nausea and vomiting but worse over the last week with poor p.o. intake, and worsening of her chronic headaches migraines.  No previous history of seizures, witnessed seizures and 2 additional seizures with EMS.  In the ED had rightward gaze and remain minimally responsible with an additional 2 seizures in the ER.  She was loaded with Keppra and treated with Ativan labs were significant for very elevated white blood cells 25.8, potassium 3.4, magnesium 0.8, Phos 5.4, calcium 10.6, glucose 316, she was admitted and intubated.  MRI was negative for any acute processes, negative CSF, overnight EEG showed mild to moderate diffuse encephalopathy no seizure or epileptiform discharges seen in long-term EEG was discontinued on July 27.  Diagnosed with seizure versus syncopal seizure from hypotension from dehydration from frequent nausea, vomiting and poor oral air intake and abnormalities of electrolytes with extremely elevated white blood cell count, hypomagnesemia and hypokalemia.  Possibly provoked seizure.  She was seen again several days later 04/10/2023 in the hospital she had a prolonged QT interval, likely due to severe electrolyte abnormality and her Zyprexa was discontinued.  She had profound hypomagnesemia and hypokalemia.  Her thyroid was normal.  Her migraines were stable.  Hospitalization was for questionable seizure versus syncope, she was admitted and seen by neurology, EEG was inconclusive, MRI of the brain was nonacute, was placed on Keppra 750  mg p.o. twice daily.    I reviewed Dr. Henriette Combs recent notes,  patient with recurrent vomiting, going on for months, smoking marijuana 3 to 4 months ago, no new meds that she knew that could be causing it, has follow-up with GI 06/02/2023.  She was also referred to nephrology.  She also sees Dr. Lolly Mustache and psychiatry.  She still has electrolyte abnormalities as of 818 and her magnesium came back low at 0.6 despite taking mag oxide.  Dr. Laural Benes suspected it was high doses of omeprazole.  She is here with sister. The Ajovy was not approved. She has not taken Ajovy, we will hold off on Ajovy. And the Ubrelvy. It has been months since she took the Ajovy. No seizures since July. Don't think she should drive until she has been stabilized medically, likely provoked seizure due to electrolyte imbalances, dehydration, vomiting, nausea, diarrhead. Continue the Keppra and no driving but once medical problems are stabilized we can order an eeg in the office and it negative I think she could drive again and keep her on keppra for 6 months. 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 When she had chemo she was able to work. She was started on lamictal and I will contact Dr. Lolly Mustache to ask if we can titrate to 200 bid and then decrease and titrate off the keppra. I warned her about the keppra and causing irritability. Her sister is very concerned about her mental health. She is taking magnesium and vitamin D and following closely with pcp, referrals to gastro and nephro. Lamictal and keppra and magnesium are used for migraines, at this time will hold off on anything and monitor migraines.  Patient complains of symptoms per HPI as well as the following symptoms: seizure . Pertinent negatives and positives per HPI. All others negative   10/03/2022: Patient was started on Ajovy. MRi showed partially empty sella which could have been incidental, no papilledema seen on exam. Here for follow up. Sandra Miller has worked tremendously well, 100%, last few been sick and  she has had some migraines but otherwise the Ajovy has been tremendous now she has flu-like symptoms. Likely illness that's causing the headaches. Ajovy has helped tremendously. She has ubrelvy as needed. We will prescribe and follow up in one year. No new brain imaging in epic review.  Patient complains of symptoms per HPI as well as the following symptoms: flu . Pertinent negatives and positives per HPI. All others negative  Reviewed labs:     Latest Ref Rng & Units 07/06/2023    3:37 PM 06/02/2023    2:55 PM 04/21/2023    2:00 PM  CMP  Glucose 70 - 99 mg/dL 97  161    BUN 6 - 24 mg/dL 15  15    Creatinine 0.96 - 1.00 mg/dL 0.45  4.09    Sodium 811 - 144 mmol/L 138  135    Potassium 3.5 - 5.2 mmol/L 4.7  4.3    Chloride 96 - 106 mmol/L 100  100    CO2 20 - 29 mmol/L 22  26    Calcium 8.7 - 10.2 mg/dL 91.4  78.2  95.6   Total Protein 6.0 - 8.5 g/dL 7.7  8.0    Total Bilirubin 0.0 - 1.2 mg/dL 0.3  0.5    Alkaline Phos 44 - 121 IU/L 92  56    AST 0 - 40 IU/L 17  16  ALT 0 - 32 IU/L 13  10        Latest Ref Rng & Units 06/02/2023    2:55 PM 04/12/2023   12:46 AM 04/11/2023    7:15 AM  CBC  WBC 4.0 - 10.5 K/uL 8.8  9.8  8.9   Hemoglobin 12.0 - 15.0 g/dL 16.1  09.6  04.5   Hematocrit 36.0 - 46.0 % 40.5  38.0  39.9   Platelets 150.0 - 400.0 K/uL 339.0  421  381       HPI:  Sandra Miller is a 47 y.o. female here as requested by Marcine Matar, MD for headaches. She has had a headache for a week ago. Migraines started years ago. PMHx bipolar, cancer, depression, gad, graves, kidney stones, htn, panic attacks, radiation therapy, ocd, pre-diates, ocd, PTSD, RLS, recovering alcoholic, breast and vulvar cancer, Pulsating/pounding/throbbing,photo/phonophobia, nausea, hurts to move, nausea, sister has migraines and daughter. Whole head, no hearing changes, sleep helps, can radiate to the back of the head. Vision is blurry all the time. She doesn't see a difference with glasses. Daily  headaches. At least 10 migraine days a month that can last 24 hours and be moderate to severe although has had 4 days of migraines in a row. No aura. No other focal neurologic deficits, associated symptoms, inciting events or modifiable factors.   Reviewed notes, labs and imaging from outside physicians, which showed:  06/28/2022 tsh and ft4 nml    TECHNIQUE: Multidetector CT imaging of the head was performed using the standard protocol during bolus administration of intravenous contrast. Multiplanar CT image reconstructions and MIPs were obtained to evaluate the vascular anatomy.   RADIATION DOSE REDUCTION: This exam was performed according to the departmental dose-optimization program which includes automated exposure control, adjustment of the mA and/or kV according to patient size and/or use of iterative reconstruction technique.   CONTRAST:  75mL OMNIPAQUE IOHEXOL 350 MG/ML SOLN   COMPARISON:  None Available.   FINDINGS: CT HEAD   Brain: Cerebral volume within normal limits for patient age.   No evidence for acute intracranial hemorrhage. No findings to suggest acute large vessel territory infarct. No mass lesion, midline shift, or mass effect. Ventricles are normal in size without evidence for hydrocephalus. No extra-axial fluid collection identified. Partially empty sella noted.   Vascular: No hyperdense vessel identified.   Skull: Scalp soft tissues demonstrate no acute abnormality. Calvarium intact.   Sinuses/Orbits: Globes and orbital soft tissues within normal limits.   Minimal ethmoidal sinus disease. Paranasal sinuses are otherwise clear. No mastoid effusion.   CTA HEAD   Anterior circulation: Both internal carotid arteries widely patent to the termini without stenosis. A1 segments widely patent. Normal anterior communicating artery complex. Both anterior cerebral arteries widely patent to their distal aspects without stenosis. No M1 stenosis or  occlusion. Normal MCA bifurcations. Distal MCA branches well perfused and symmetric.   Posterior circulation: Both V4 segments patent to the vertebrobasilar junction without stenosis. Both PICA origins patent and normal. Basilar widely patent to its distal aspect without stenosis. Superior cerebellar arteries patent bilaterally. Both PCAs primarily supplied via the basilar and are well perfused to there distal aspects.   Venous sinuses: Patent allowing for timing the contrast bolus.   Anatomic variants: None significant.  No intracranial aneurysm.   Review of the MIP images confirms the above findings.   CT VENOGRAM   Normal enhancement seen throughout the superior sagittal sinus to the torcula. Transverse and sigmoid sinuses are patent  as are the jugular bulbs and visualized proximal internal jugular veins. Right transverse sinus dominant. Straight sinus, vein of Galen, and internal cerebral veins are patent. No visible cortical vein thrombosis. No dural sinus thrombosis.   IMPRESSION: 1. Normal CTA of the head. No large vessel occlusion, hemodynamically significant stenosis, or other acute vascular abnormality. No aneurysm. 2. Negative CT venogram.  Evidence for dural sinus thrombosis. 3. No other acute intracranial abnormality. 4. Partially empty sella. Finding is nonspecific, but can be seen in the setting of idiopathic intracranial hypertension.  Review of Systems: Patient complains of symptoms per HPI as well as the following symptoms migraines. Pertinent negatives and positives per HPI. All others negative.   Social History   Socioeconomic History   Marital status: Single    Spouse name: Not on file   Number of children: Not on file   Years of education: Not on file   Highest education level: Not on file  Occupational History   Not on file  Tobacco Use   Smoking status: Former    Current packs/day: 0.50    Types: Cigarettes   Smokeless tobacco: Never    Tobacco comments:    Recently started a smoking cessation class. Quit 03/31/23  Vaping Use   Vaping status: Never Used  Substance and Sexual Activity   Alcohol use: Not Currently   Drug use: Not Currently    Types: Marijuana    Comment: none in about 1 year   Sexual activity: Yes    Partners: Male    Birth control/protection: Surgical  Other Topics Concern   Not on file  Social History Narrative   Former [facility] patient.      Was on disability at one point.   Return to the workforce.  40 hours a week at [employer], 10 hours a week on the weekends at [facility] in [place], [employer] at night 10 to 12 hours a week.      Has grown children, she lives alone with a pet, continues to smoke no alcohol or drug use at this time      History of EtOH abuse and THC use.         Social Drivers of Health   Financial Resource Strain: Medium Risk (08/01/2023)   Overall Financial Resource Strain (CARDIA)    Difficulty of Paying Living Expenses: Somewhat hard  Food Insecurity: No Food Insecurity (08/01/2023)   Hunger Vital Sign    Worried About Running Out of Food in the Last Year: Never true    Ran Out of Food in the Last Year: Never true  Transportation Needs: No Transportation Needs (08/01/2023)   PRAPARE - Administrator, Civil Service (Medical): No    Lack of Transportation (Non-Medical): No  Physical Activity: Inactive (08/01/2023)   Exercise Vital Sign    Days of Exercise per Week: 0 days    Minutes of Exercise per Session: 0 min  Stress: Stress Concern Present (08/01/2023)   [employer] of Occupational Health - Occupational Stress Questionnaire    Feeling of Stress : Rather much  Social Connections: Socially Isolated (08/01/2023)   Social Connection and Isolation Panel [NHANES]    Frequency of Communication with Friends and Family: Three times a week    Frequency of Social Gatherings with Friends and Family: Never    Attends Religious Services:  Never    Database administrator or Organizations: No    Attends Banker Meetings: Never  Marital Status: Never married  Intimate Partner Violence: Not At Risk (08/01/2023)   Humiliation, Afraid, Rape, and Kick questionnaire    Fear of Current or Ex-Partner: No    Emotionally Abused: No    Physically Abused: No    Sexually Abused: No    Family History  Problem Relation Age of Onset   Heart disease Father    Lung cancer Father        d. 68   Alcohol abuse Father    Heart disease Mother    Depression Mother    Anxiety disorder Mother    Drug abuse Brother    Alcohol abuse Brother    Drug abuse Brother    ADD / ADHD Brother    Colon polyps Brother    Cancer Paternal Grandfather        "stomach"   Diabetes Maternal Grandfather    Diabetes Paternal Grandmother    Kidney disease Maternal Uncle    Cirrhosis Cousin        alcoholic   Anxiety disorder Maternal Aunt    Depression Maternal Aunt    Cancer Cousin        maternal; ovarian cancer or other "female" cancer?   Lung cancer Paternal Uncle 105   Throat cancer Cousin        paternal; dx 10s   Lung cancer Cousin        paternal; dx 96s    Past Medical History:  Diagnosis Date   Arthritis    Bipolar 1 disorder (HCC)    Cancer (HCC)    vulva, and breast   Complication of anesthesia    wakes up during procedures   Depression    GAD (generalized anxiety disorder)    Genital HSV    currently per pt  no break out 03-22-2016    GERD (gastroesophageal reflux disease)    Graves disease    Hiatal hernia    History of cervical dysplasia    2012 laser ablation   History of esophageal dilatation    for dysphasia -- x2 dilated   History of gastric ulcer    History of Helicobacter pylori infection    remote hx   History of hidradenitis suppurativa    "gets all over body intermittantly"     History of hypertension    no issue since stopped drinking alcohol 2014   History of kidney stones    History of  panic attacks    History of radiation therapy 03/16/2020-05/08/2020   vulva  Dr Antony Blackbird   History of radiation therapy 09/09/2020-10/23/2020   left chest wall/left SCV   Dr Antony Blackbird   Hypertension    Iron deficiency anemia    Left ureteral stone    OCD (obsessive compulsive disorder)    PONV (postoperative nausea and vomiting)    Pre-diabetes    PTSD (post-traumatic stress disorder)    Recovering alcoholic in remission (HCC)    since 2014   RLS (restless legs syndrome)    Smokers' cough (HCC)    Thyroid disease    Urgency of urination    Yeast infection involving the vagina and surrounding area    secondary to taking antibiotic    Patient Active Problem List   Diagnosis Date Noted   Chronic migraine without aura, with intractable migraine, so stated, with status migrainosus 08/22/2023   Mood disorder (HCC) 05/29/2023   Fatigue 05/15/2023   Prolonged QT interval 04/10/2023   Dizziness 04/10/2023   Provoked seizures (HCC)  03/31/2023   Respiratory depression 03/31/2023   Status epilepticus (HCC) 03/31/2023   Migraine with aura and without status migrainosus, not intractable 10/03/2022   Pain in right knee 09/07/2022   Bilateral primary osteoarthritis of knee 09/07/2022   Chronic migraine without aura without status migrainosus, not intractable 07/07/2022   Graves disease 05/10/2022   Sebaceous cyst 12/06/2021   Pain in left knee 11/03/2021   Hyperthyroidism 08/13/2021   Bipolar 1 disorder (HCC) 05/07/2021   Obesity (BMI 30.0-34.9) 05/07/2021   Hyperlipidemia 08/12/2020   Prediabetes 08/12/2020   Hypomagnesemia 08/12/2020   Mutation in BRIP1 gene 06/24/2020   Hypokalemia 06/17/2020   Dehydration    Colitis 06/16/2020   Port-A-Cath in place 03/11/2020   Malignant neoplasm of upper-inner quadrant of left breast in female, estrogen receptor positive (HCC) 02/25/2020   Vulvar cancer (HCC) 01/29/2020   Vulvar ulceration 01/23/2020   Essential hypertension 10/31/2019    Nausea and vomiting 12/27/2018   Seasonal allergic rhinitis 12/27/2018   History of ELISA positive for HSV 10/01/2018   Restless leg syndrome 04/01/2016   Nephrolithiasis 03/08/2016   Obstructive pyelonephritis 03/08/2016   Normocytic anemia 03/08/2016   Hidradenitis suppurativa of left axilla 11/03/2015   Loss of weight 09/08/2015   Neck pain on left side 10/22/2014   Genital warts 05/20/2014   GERD (gastroesophageal reflux disease) 12/26/2012   Functional dyspepsia 12/26/2012   Tobacco abuse 08/22/2012   Herpes simplex virus (HSV) infection 02/03/2007    Past Surgical History:  Procedure Laterality Date   CESAREAN SECTION  1995   w/  Bilateral Tubal Ligation   COLONOSCOPY  last one 08-09-2013   CYSTOSCOPY W/ URETERAL STENT PLACEMENT Left 03/29/2016   Procedure: CYSTOSCOPY WITH STENT REPLACEMENT;  Surgeon: Hildred Laser, MD;  Location: [facility];  Service: Urology;  Laterality: Left;   CYSTOSCOPY WITH RETROGRADE PYELOGRAM, URETEROSCOPY AND STENT PLACEMENT Left 03/08/2016   Procedure: CYSTOSCOPY WITH  LEFT RETROGRADE PYELOGRAM, AND STENT PLACEMENT;  Surgeon: Hildred Laser, MD;  Location: [facility];  Service: Urology;  Laterality: Left;   CYSTOSCOPY/RETROGRADE/URETEROSCOPY/STONE EXTRACTION WITH BASKET Left 03/29/2016   Procedure: CYSTOSCOPY/RETROGRADE/URETEROSCOPY/STONE EXTRACTION WITH BASKET;  Surgeon: Hildred Laser, MD;  Location: [facility];  Service: Urology;  Laterality: Left;   ENDOMETRIAL ABLATION W/ NOVASURE  04-01-2010   ESOPHAGOGASTRODUODENOSCOPY  last one 08-09-2013   KNEE ARTHROSCOPY Left as teen   LASER ABLATION OF THE CERVIX  2012 approx   MASTECTOMY WITH AXILLARY LYMPH NODE DISSECTION Left 07/27/2020   Procedure: LEFT MASTECTOMY WITH LEFT RADIOACTIVE SEED GUIDED TARGETED AXILLARY LYMPH NODE DISSECTION;  Surgeon: Ovidio Kin, MD;  Location: [facility];  Service: General;  Laterality: Left;   PORT-A-CATH REMOVAL Right 05/18/2021    Procedure: REMOVAL PORT-A-CATH;  Surgeon: Manus Rudd, MD;  Location: [facility];  Service: General;  Laterality: Right;   PORTACATH PLACEMENT Right 03/06/2020   Procedure: INSERTION PORT-A-CATH WITH ULTRASOUND GUIDANCE;  Surgeon: Ovidio Kin, MD;  Location: [facility];  Service: General;  Laterality: Right;   ROBOTIC ASSISTED TOTAL HYSTERECTOMY WITH BILATERAL SALPINGO OOPHERECTOMY N/A 05/18/2021   Procedure: XI ROBOTIC ASSISTED TOTAL HYSTERECTOMY WITH BILATERAL SALPINGO OOPHORECTOMY;  Surgeon: Adolphus Birchwood, MD;  Location: [facility];  Service: Gynecology;  Laterality: N/A;   TRANSTHORACIC ECHOCARDIOGRAM  05-19-2006   lvsf normal, ef 55-65%, there was mild flattening of the interventricular septum during diastoli/  RV size at upper limits normal   TUBAL LIGATION     VULVA /PERINEUM BIOPSY N/A 05/18/2021   Procedure:  VULVAR BIOPSY;  Surgeon: Adolphus Birchwood, MD;  Location: [facility];  Service: Gynecology;  Laterality: N/A;   WISDOM TOOTH EXTRACTION  age 67 's    Current Outpatient Medications  Medication Sig Dispense Refill   Fremanezumab-vfrm (AJOVY) 225 MG/1.5ML SOAJ Inject 225 mg into the skin every 30 (thirty) days. 1.5 mL 11   Fremanezumab-vfrm (AJOVY) 225 MG/1.5ML SOAJ Inject 225 mg into the skin every 30 (thirty) days.     LamoTRIgine (LAMICTAL XR) 200 MG TB24 24 hour tablet Take 1 tablet (200 mg total) by mouth at bedtime. 90 tablet 4   Ubrogepant (UBRELVY) 50 MG TABS Take 50 mg as needed for migraine, a second dose may be taken at least 2 hours after the initial dose. DO NOT EXCEED 2 doses in 24 hours.     diclofenac Sodium (VOLTAREN) 1 % GEL Apply 4 grams topically 4  times daily as needed. (Patient taking differently: Apply 4 g topically 4 (four) times daily as needed (pain).) 500 g 6   famotidine (PEPCID) 40 MG tablet Take 1 tablet (40 mg total) by mouth daily. 90 tablet 3   FLUoxetine (PROZAC) 10 MG capsule Take 3 capsules (30 mg total) by mouth daily. 90 capsule 2   gabapentin  (NEURONTIN) 300 MG capsule Take 1 capsule (300 mg) by mouth daily. 30 capsule 1   letrozole (FEMARA) 2.5 MG tablet Take 1 tablet by mouth daily. 90 tablet 3   lisinopril (ZESTRIL) 10 MG tablet Take 1 tablet (10 mg total) by mouth daily. Stop the Lisinopril/HCTZ combination 90 tablet 0   loratadine (CLARITIN REDITABS) 10 MG dissolvable tablet Take 10 mg by mouth daily. OTC     LORazepam (ATIVAN) 1 MG tablet Take 1 tablet (1 mg total) by mouth 3 (three) times daily as needed for anxiety 90 tablet 2   Magnesium 400 MG TABS Take 800 mg by mouth 2 (two) times daily. 120 tablet 1   methimazole (TAPAZOLE) 5 MG tablet Take one tablet (5 mg dose) by mouth daily. (Patient taking differently: Take 5 mg by mouth daily.) 90 tablet 3   Multiple Vitamin (MULTIVITAMIN WITH MINERALS) TABS tablet Take 1 tablet by mouth daily.     Multiple Vitamins-Minerals (CERTA-VITE PO) Take by mouth daily. OTC     omeprazole (PRILOSEC) 40 MG capsule Take 1 capsule (40 mg total) by mouth daily before breakfast. 90 capsule 3   potassium chloride SA (KLOR-CON M) 20 MEQ tablet Take 1 tablet (20 mEq) by mouth daily. 90 tablet 1   promethazine (PHENERGAN) 12.5 MG tablet Take 1 tablet (12.5 mg total) by mouth every 8 (eight) hours as needed for nausea or vomiting. 30 tablet 1   propranolol (INDERAL) 10 MG tablet Take 1 tablet by mouth 2 times daily. 180 tablet 0   spironolactone (ALDACTONE) 25 MG tablet Take 0.5 tablets (12.5 mg total) by mouth daily. 60 tablet 1   No current facility-administered medications for this visit.    Allergies as of 08/22/2023 - Review Complete 08/01/2023  Allergen Reaction Noted   Dilaudid [hydromorphone hcl] Other (See Comments) 06/18/2020   Aspirin Hives    Depakote [divalproex sodium] Nausea And Vomiting 11/01/2016   Minocycline Hives 09/23/2011    Vitals: There were no vitals taken for this visit. Last Weight:  Wt Readings from Last 1 Encounters:  08/01/23 195 lb (88.5 kg)   Last Height:    Ht Readings from Last 1 Encounters:  08/01/23 5\' 6"  (1.676 m)    Physical exam: Exam:  Gen: NAD, conversant      CV: No palpitations or chest pain or SOB. VS: Breathing at a normal rate. Weight appears obese. Not febrile. Eyes: Conjunctivae clear without exudates or hemorrhage  Neuro: Detailed Neurologic Exam  Speech:    Speech is normal; fluent and spontaneous with normal comprehension.  Cognition:    The patient is oriented to person, place, and time;     recent and remote memory intact;     language fluent;     normal attention, concentration, fund of knowledge Cranial Nerves:    The pupils are equal, round, and reactive to light. Visual fields are full Extraocular movements are intact.  The face is symmetric with normal sensation. The palate elevates in the midline. Hearing intact. Voice is normal. Shoulder shrug is normal. The tongue has normal motion without fasciculations.   Coordination: normal  Gait:    No abnormalities noted or reported  Motor Observation:   no involuntary movements noted. Tone:    Appears normal  Posture:    Posture is normal. normal erect    Strength:    Strength is anti-gravity and symmetric in the upper and lower limbs.      Sensation: intact to LT, no reports of numbness or tingling or paresthesias                 Assessment/Plan:  Patient with likely provoked seizure due to electrolyte imbalances, dehydration, vomiting, nausea, diarrhea. Started the Keppra and switched to Lamictal for use in mood disprder and seizure although seizure was provoked and mikely due to vomiting and electrolyte imbalances and illness.    200mg  Lamictal at bedtime. Stay on Gabapentin. Cannot ensure no more seizures but on 2 seizure medications. Wait until 6 months seizure free to drive.  2. Stop one pill of keppra today and on Sunday stop the keppra 3. Has 15 migraine days a month and daily headaches. Restart Ajovy, did excellent in the past 4. Highly  recommended sleep eval but she declined 5. Follow up with primary care and I will have office call for follow up with [place] 6. MRI showed empty sella but no papilledema and her headaches drastucally improved on Ajovy so unlikely IDIOPATHIC INTRACRANIAL HYPERTENSION but monitor 7. Advised smoking cessation continuation   Meds ordered this encounter  Medications   LamoTRIgine (LAMICTAL XR) 200 MG TB24 24 hour tablet    Sig: Take 1 tablet (200 mg total) by mouth at bedtime.    Dispense:  90 tablet    Refill:  4   Fremanezumab-vfrm (AJOVY) 225 MG/1.5ML SOAJ    Sig: Inject 225 mg into the skin every 30 (thirty) days.    Dispense:  1.5 mL    Refill:  11    No orders of the defined types were placed in this encounter.  Meds ordered this encounter  Medications   LamoTRIgine (LAMICTAL XR) 200 MG TB24 24 hour tablet    Sig: Take 1 tablet (200 mg total) by mouth at bedtime.    Dispense:  90 tablet    Refill:  4   Fremanezumab-vfrm (AJOVY) 225 MG/1.5ML SOAJ    Sig: Inject 225 mg into the skin every 30 (thirty) days.    Dispense:  1.5 mL    Refill:  11   Fremanezumab-vfrm (AJOVY) 225 MG/1.5ML SOAJ    Sig: Inject 225 mg into the skin every 30 (thirty) days.    Lot: [id number] Exp: 02/2024 NDC: 16109-604-54   Ubrogepant (UBRELVY) 50 MG TABS  Sig: Take 50 mg as needed for migraine, a second dose may be taken at least 2 hours after the initial dose. DO NOT EXCEED 2 doses in 24 hours.    LOT #: [id number] EXP: 09/2024 NDC: 4540-9811-91   Discussed: Per [facility] statutes, patients with seizures are not allowed to drive until they have been seizure-free for six months.    Use caution when using heavy equipment or power tools. Avoid working on ladders or at heights. Take showers instead of baths. Ensure the water temperature is not too high on the home water heater. Do not go swimming alone. Do not lock yourself in a room alone (i.e. bathroom). When caring for infants or small children, sit  down when holding, feeding, or changing them to minimize risk of injury to the child in the event you have a seizure. Maintain good sleep hygiene. Avoid alcohol.    If patient has another seizure, call 911 and bring them back to the ED if: A.  The seizure lasts longer than 5 minutes.      B.  The patient doesn't wake shortly after the seizure or has new problems such as difficulty seeing, speaking or moving following the seizure C.  The patient was injured during the seizure D.  The patient has a temperature over 102 F (39C) E.  The patient vomited during the seizure and now is having trouble breathing  Per [facility] statutes, patients with seizures are not allowed to drive until they have been seizure-free for six months.  Other recommendations include using caution when using heavy equipment or power tools. Avoid working on ladders or at heights. Take showers instead of baths.  Do not swim alone.  Ensure the water temperature is not too high on the home water heater. Do not go swimming alone. Do not lock yourself in a room alone (i.e. bathroom). When caring for infants or small children, sit down when holding, feeding, or changing them to minimize risk of injury to the child in the event you have a seizure. Maintain good sleep hygiene. Avoid alcohol.  Also recommend adequate sleep, hydration, good diet and minimize stress.  During the Seizure  - First, ensure adequate ventilation and place patients on the floor on their left side  Loosen clothing around the neck and ensure the airway is patent. If the patient is clenching the teeth, do not force the mouth open with any object as this can cause severe damage - Remove all items from the surrounding that can be hazardous. The patient may be oblivious to what's happening and may not even know what he or she is doing. If the patient is confused and wandering, either gently guide him/her away and block access to outside areas - Reassure the  individual and be comforting - Call 911. In most cases, the seizure ends before EMS arrives. However, there are cases when seizures may last over 3 to 5 minutes. Or the individual may have developed breathing difficulties or severe injuries. If a pregnant patient or a person with diabetes develops a seizure, it is prudent to call an ambulance. - Finally, if the patient does not regain full consciousness, then call EMS. Most patients will remain confused for about 45 to 90 minutes after a seizure, so you must use judgment in calling for help. - Avoid restraints but make sure the patient is in a bed with padded side rails - Place the individual in a lateral position with the neck slightly  flexed; this will help the saliva drain from the mouth and prevent the tongue from falling backward - Remove all nearby furniture and other hazards from the area - Provide verbal assurance as the individual is regaining consciousness - Provide the patient with privacy if possible - Call for help and start treatment as ordered by the caregiver   fter the Seizure (Postictal Stage)  After a seizure, most patients experience confusion, fatigue, muscle pain and/or a headache. Thus, one should permit the individual to sleep. For the next few days, reassurance is essential. Being calm and helping reorient the person is also of importance.  Most seizures are painless and end spontaneously. Seizures are not harmful to others but can lead to complications such as stress on the lungs, brain and the heart. Individuals with prior lung problems may develop labored breathing and respiratory distress.   Discussed: To prevent or relieve headaches, try the following: Cool Compress. Lie down and place a cool compress on your head.  Avoid headache triggers. If certain foods or odors seem to have triggered your migraines in the past, avoid them. A headache diary might help you identify triggers.  Include physical activity in your daily  routine. Try a daily walk or other moderate aerobic exercise.  Manage stress. Find healthy ways to cope with the stressors, such as delegating tasks on your to-do list.  Practice relaxation techniques. Try deep breathing, yoga, massage and visualization.  Eat regularly. Eating regularly scheduled meals and maintaining a healthy diet might help prevent headaches. Also, drink plenty of fluids.  Follow a regular sleep schedule. Sleep deprivation might contribute to headaches Consider biofeedback. With this mind-body technique, you learn to control certain bodily functions -- such as muscle tension, heart rate and blood pressure -- to prevent headaches or reduce headache pain.    Proceed to emergency room if you experience new or worsening symptoms or symptoms do not resolve, if you have new neurologic symptoms or if headache is severe, or for any concerning symptom.   Provided education and documentation from [employer] toolbox including articles on: chronic migraine medication overuse headache, chronic migraines, prevention of migraines, behavioral and other nonpharmacologic treatments for headache.  Cc: Marcine Matar, MD,  Marcine Matar, MD  Naomie Dean, MD  [facility] [address] [place], [place] [address]

## 2023-08-22 NOTE — Telephone Encounter (Signed)
Can you schedule a 4 month follow up with me or NP video for patient for follow up thank you

## 2023-08-22 NOTE — Telephone Encounter (Signed)
Pt scheduled for video visit with Dr. [name] for 01/11/24 at 2pm

## 2023-08-22 NOTE — Patient Instructions (Addendum)
200mg  Lamictal at bedtime 2. Stop one pill of keppra today and on Sunday stop the keppra 3. Has 15 migraine days a month and daily headaches. Restart Ajovy. When she has 6 migraine days a month and < 14 total headache days a month she will contact us for ubrelvy as that also was spectacular int he past.   Meds ordered this encounter  Medications   LamoTRIgine (LAMICTAL XR) 200 MG TB24 24 hour tablet    Sig: Take 1 tablet (200 mg total) by mouth at bedtime.    Dispense:  90 tablet    Refill:  4   Fremanezumab-vfrm (AJOVY) 225 MG/1.5ML SOAJ    Sig: Inject 225 mg into the skin every 30 (thirty) days.    Dispense:  1.5 mL    Refill:  11   Sleep Apnea  Sleep apnea is a condition that affects your breathing while you are sleeping. Your tongue or soft tissue in your throat may block the flow of air while you sleep. You may have shallow breathing or stop breathing for short periods of time. People with sleep apnea may snore loudly. There are three kinds of sleep apnea: Obstructive sleep apnea. This kind is caused by a blocked or collapsed airway. This is the most common. Central sleep apnea. This kind happens when the part of the brain that controls breathing does not send the correct signals to the muscles that control breathing. Mixed sleep apnea. This is a combination of obstructive and central sleep apnea. What are the causes? The most common cause of sleep apnea is a collapsed or blocked airway. What increases the risk? Being very overweight. Having family members with sleep apnea. Having a tongue or tonsils that are larger than normal. Having a small airway or jaw problems. Being older. What are the signs or symptoms? Loud snoring. Restless sleep. Trouble staying asleep. Being sleepy or tired during the day. Waking up gasping or choking. Having a headache in the morning. Mood swings. Having a hard time remembering things and concentrating. How is this diagnosed? A medical  history. A physical exam. A sleep study. This is also called a polysomnography test. This test is done at a sleep lab or in your home while you are sleeping. How is this treated? Treatment may include: Sleeping on your side. Losing weight if you're overweight. Wearing an oral appliance. This is a mouthpiece that moves your lower jaw forward. Using a positive airway pressure (PAP) device to keep your airways open while you sleep, such as: A continuous positive airway pressure (CPAP) device. This device gives forced air through a mask when you breathe out. This keeps your airways open. A bilevel positive airway pressure (BIPAP) device. This device gives forced air through a mask when you breathe in and when you breathe out to keep your airways open. Having surgery if other treatments do not work. If your sleep apnea is not treated, you may be at risk for: Heart failure. Heart attack. Stroke. Type 2 diabetes or a problem with your blood sugar called insulin resistance. Follow these instructions at home: Medicines Take your medicines only as told by your health care provider. Avoid alcohol, medicines to help you relax, and certain pain medicines. These may make sleep apnea worse. General instructions Do not smoke, vape, or use products with nicotine or tobacco in them. If you need help quitting, talk with your provider. If you were given a PAP device to open your airway while you sleep, use it as  told by your provider. If you're having surgery, make sure to tell your provider you have sleep apnea. You may need to bring your PAP device with you. Contact a health care provider if: The PAP device that you were given to use during sleep bothers you or does not seem to be working. You do not feel better or you feel worse. Get help right away if: You have trouble breathing. You have chest pain. You have trouble talking. One side of your body feels weak. A part of your face is hanging  down. These symptoms may be an emergency. Call 911 right away. Do not wait to see if the symptoms will go away. Do not drive yourself to the hospital. This information is not intended to replace advice given to you by your health care provider. Make sure you discuss any questions you have with your health care provider. Document Revised: 10/27/2022 Document Reviewed: 10/27/2022 Elsevier Patient Education  2024 Else[employer] Inc.  Lamotrigine Tablets What is this medication? LAMOTRIGINE (la MOE Patrecia Pace) prevents and controls seizures in people with epilepsy. It may also be used to treat bipolar disorder. It works by calming overactive nerves in your body. This medicine may be used for other purposes; ask your health care provider or pharmacist if you have questions. COMMON BRAND NAME(S): Lamictal, Subvenite What should I tell my care team before I take this medication? They need to know if you have any of these conditions: Heart disease History of irregular heartbeat Immune system problems Kidney disease Liver disease Low levels of folic acid in the blood Lupus Mental health condition Suicidal thoughts, plans, or attempt by you or a family member An unusual or allergic reaction to lamotrigine, other medications, foods, dyes, or preservatives Pregnant or trying to get pregnant Breastfeeding How should I use this medication? Take this medication by mouth with a glass of water. Follow the directions on the prescription label. Do not chew these tablets. If this medication upsets your stomach, take it with food or milk. Take your doses at regular intervals. Do not take your medication more often than directed. A special MedGuide will be given to you by the pharmacist with each new prescription and refill. Be sure to read this information carefully each time. Talk to your care team about the use of this medication in children. While this medication may be prescribed for children as young as 2  years for selected conditions, precautions do apply. Overdosage: If you think you have taken too much of this medicine contact a poison control center or emergency room at once. NOTE: This medicine is only for you. Do not share this medicine with others. What if I miss a dose? If you miss a dose, take it as soon as you can. If it is almost time for your next dose, take only that dose. Do not take double or extra doses. What may interact with this medication? Atazanavir Certain medications for irregular heartbeat Certain medications for seizures, such as carbamazepine, phenobarbital, phenytoin, primidone, or valproic acid Estrogen or progestin hormones Lopinavir Rifampin Ritonavir This list may not describe all possible interactions. Give your health care provider a list of all the medicines, herbs, non-prescription drugs, or dietary supplements you use. Also tell them if you smoke, drink alcohol, or use illegal drugs. Some items may interact with your medicine. What should I watch for while using this medication? Visit your care team for regular checks on your progress. If you take this medication for seizures, wear  a Arboriculturist or necklace. Carry an identification card with information about your condition, medications, and care team. It is important to take this medication exactly as directed. When first starting treatment, your dose will need to be adjusted slowly. It may take weeks or months before your dose is stable. You should contact your care team if your seizures get worse or if you have any new types of seizures. Do not stop taking this medication unless instructed by your care team. Stopping your medication suddenly can increase your seizures or their severity. This medication may cause serious skin reactions. They can happen weeks to months after starting the medication. Contact your care team right away if you notice fevers or flu-like symptoms with a rash. The rash may be  red or purple and then turn into blisters or peeling of the skin. You may also notice a red rash with swelling of the face, lips, or lymph nodes in your neck or under your arms. This medication may affect your coordination, reaction time, or judgment. Do not drive or operate machinery until you know how this medication affects you. Sit up or stand slowly to reduce the risk of dizzy or fainting spells. Drinking alcohol with this medication can increase the risk of these side effects. If you are taking this medication for bipolar disorder, it is important to report any changes in your mood to your care team. If your condition gets worse, you get mentally depressed, feel very hyperactive or manic, have difficulty sleeping, or have thoughts of hurting yourself or committing suicide, you need to get help from your care team right away. If you are a caregiver for someone taking this medication for bipolar disorder, you should also report these behavioral changes right away. The use of this medication may increase the chance of suicidal thoughts or actions. Pay special attention to how you are responding while on this medication. Your mouth may get dry. Chewing sugarless gum or sucking hard candy and drinking plenty of water may help. Contact your care team if the problem does not go away or is severe. If you become pregnant while using this medication, you may enroll in the [place] American Antiepileptic Drug Pregnancy Registry by calling [phone]. This registry collects information about the safety of antiepileptic medication use during pregnancy. This medication may cause a decrease in folic acid. You should make sure that you get enough folic acid while you are taking this medication. Discuss the foods you eat and the vitamins you take with your care team. What side effects may I notice from receiving this medication? Side effects that you should report to your care team as soon as possible: Allergic  reactions--skin rash, itching, hives, swelling of the face, lips, tongue, or throat Change in vision Fever, neck pain or stiffness, sensitivity to light, headache, nausea, vomiting, confusion Heart rhythm changes--fast or irregular heartbeat, dizziness, feeling faint or lightheaded, chest pain, trouble breathing Infection--fever, chills, cough, or sore throat Liver injury--right upper belly pain, loss of appetite, nausea, light-colored stool, dark yellow or brown urine, yellowing skin or eyes, unusual weakness or fatigue Low red blood cell count--unusual weakness or fatigue, dizziness, headache, trouble breathing Rash, fever, and swollen lymph nodes Redness, blistering, peeling or loosening of the skin, including inside the mouth Thoughts of suicide or self-harm, worsening mood, or feelings of depression Unusual bruising or bleeding Side effects that usually do not require medical attention (report to your care team if they continue or are bothersome): Diarrhea Dizziness  Drowsiness Headache Nausea Stomach pain Tremors or shaking This list may not describe all possible side effects. Call your doctor for medical advice about side effects. You may report side effects to FDA at 1-800-FDA-1088. Where should I keep my medication? Keep out of the reach of children and pets. Store at [employer] C (77 degrees F). Protect from light. Get rid of any unused medication after the expiration date. To get rid of medications that are no longer needed or have expired: Take the medication to a medication take-back program. Check with your pharmacy or law enforcement to find a location. If you cannot return the medication, check the label or package insert to see if the medication should be thrown out in the garbage or flushed down the toilet. If you are not sure, ask your care team. If it is safe to put it in the trash, empty the medication out of the container. Mix the medication with cat litter, dirt, coffee  grounds, or other unwanted substance. Seal the mixture in a bag or container. Put it in the trash. NOTE: This sheet is a summary. It may not cover all possible information. If you have questions about this medicine, talk to your doctor, pharmacist, or health care provider.  2024 Elsevier/Gold Standard (2022-03-01 00:00:00) Vernell Barrier Injection What is this medication? FREMANEZUMAB (fre ma NEZ ue mab) prevents migraines. It works by blocking a substance in the body that causes migraines. It is a monoclonal antibody. This medicine may be used for other purposes; ask your health care provider or pharmacist if you have questions. COMMON BRAND NAME(S): AJOVY What should I tell my care team before I take this medication? They need to know if you have any of these conditions: An unusual or allergic reaction to fremanezumab, other medications, foods, dyes, or preservatives Pregnant or trying to get pregnant Breast-feeding How should I use this medication? This medication is injected under the skin. You will be taught how to prepare and give it. Take it as directed on the prescription label. Keep taking it unless your care team tells you to stop. It is important that you put your used needles and syringes in a special sharps container. Do not put them in a trash can. If you do not have a sharps container, call your pharmacist or care team to get one. Talk to your care team about the use of this medication in children. Special care may be needed. Overdosage: If you think you have taken too much of this medicine contact a poison control center or emergency room at once. NOTE: This medicine is only for you. Do not share this medicine with others. What if I miss a dose? If you miss a dose, take it as soon as you can. If it is almost time for your next dose, take only that dose. Do not take double or extra doses. What may interact with this medication? Interactions are not expected. This list may not  describe all possible interactions. Give your health care provider a list of all the medicines, herbs, non-prescription drugs, or dietary supplements you use. Also tell them if you smoke, drink alcohol, or use illegal drugs. Some items may interact with your medicine. What should I watch for while using this medication? Tell your care team if your symptoms do not start to get better or if they get worse. What side effects may I notice from receiving this medication? Side effects that you should report to your care team as soon as possible:  Allergic reactions or angioedema--skin rash, itching or hives, swelling of the face, eyes, lips, tongue, arms, or legs, trouble swallowing or breathing Side effects that usually do not require medical attention (report to your care team if they continue or are bothersome): Pain, redness, or irritation at injection site This list may not describe all possible side effects. Call your doctor for medical advice about side effects. You may report side effects to FDA at 1-800-FDA-1088. Where should I keep my medication? Keep out of the reach of children and pets. Store in a refrigerator or at room temperature between 20 and 25 degrees C (68 and 77 degrees F). Refrigeration (preferred): Store in the refrigerator. Do not freeze. Keep in the original container until you are ready to take it. Remove the dose from the carton about 30 minutes before it is time for you to use it. If the dose is not used, it may be stored in the original container at room temperature for 7 days. Get rid of any unused medication after the expiration date. Room Temperature: This medication may be stored at room temperature for up to 7 days. Keep it in the original container. Protect from light until time of use. If it is stored at room temperature, get rid of any unused medication after 7 days or after it expires, whichever is first. To get rid of medications that are no longer needed or have  expired: Take the medication to a medication take-back program. Check with your pharmacy or law enforcement to find a location. If you cannot return the medication, ask your pharmacist or care team how to get rid of this medication safely. NOTE: This sheet is a summary. It may not cover all possible information. If you have questions about this medicine, talk to your doctor, pharmacist, or health care provider.  2024 Elsevier/Gold Standard (2021-10-15 00:00:00)

## 2023-08-23 ENCOUNTER — Other Ambulatory Visit (HOSPITAL_COMMUNITY): Payer: Self-pay

## 2023-08-28 ENCOUNTER — Other Ambulatory Visit (HOSPITAL_COMMUNITY): Payer: Self-pay

## 2023-08-29 ENCOUNTER — Other Ambulatory Visit: Payer: Self-pay

## 2023-08-29 ENCOUNTER — Emergency Department (HOSPITAL_COMMUNITY): Payer: PRIVATE HEALTH INSURANCE

## 2023-08-29 ENCOUNTER — Emergency Department (HOSPITAL_COMMUNITY)
Admission: EM | Admit: 2023-08-29 | Discharge: 2023-08-29 | Disposition: A | Discharge status: Home / Self Care | Payer: PRIVATE HEALTH INSURANCE | Attending: Student | Admitting: Student

## 2023-08-29 DIAGNOSIS — Z8544 Personal history of malignant neoplasm of other female genital organs: Secondary | ICD-10-CM | POA: Diagnosis not present

## 2023-08-29 DIAGNOSIS — R1031 Right lower quadrant pain: Secondary | ICD-10-CM | POA: Insufficient documentation

## 2023-08-29 DIAGNOSIS — Z853 Personal history of malignant neoplasm of breast: Secondary | ICD-10-CM | POA: Insufficient documentation

## 2023-08-29 DIAGNOSIS — I10 Essential (primary) hypertension: Secondary | ICD-10-CM | POA: Insufficient documentation

## 2023-08-29 DIAGNOSIS — F17200 Nicotine dependence, unspecified, uncomplicated: Secondary | ICD-10-CM | POA: Insufficient documentation

## 2023-08-29 DIAGNOSIS — R109 Unspecified abdominal pain: Secondary | ICD-10-CM

## 2023-08-29 LAB — COMPREHENSIVE METABOLIC PANEL
ALT: 15 U/L (ref 0–44)
AST: 22 U/L (ref 15–41)
Albumin: 4.5 g/dL (ref 3.5–5.0)
Alkaline Phosphatase: 71 U/L (ref 38–126)
Anion gap: 12 (ref 5–15)
BUN: 11 mg/dL (ref 6–20)
CO2: 23 mmol/L (ref 22–32)
Calcium: 10 mg/dL (ref 8.9–10.3)
Chloride: 100 mmol/L (ref 98–111)
Creatinine, Ser: 0.42 mg/dL — ABNORMAL LOW (ref 0.44–1.00)
GFR, Estimated: >60 mL/min (ref >60)
Glucose, Bld: 116 mg/dL — ABNORMAL HIGH (ref 70–99)
Potassium: 3.6 mmol/L (ref 3.5–5.1)
Sodium: 135 mmol/L (ref 135–145)
Total Bilirubin: 1.1 mg/dL (ref <1.2)
Total Protein: 8.3 g/dL — ABNORMAL HIGH (ref 6.5–8.1)

## 2023-08-29 LAB — CBC WITH DIFFERENTIAL/PLATELET
Abs Immature Granulocytes: 0.03 10*3/uL (ref 0.00–0.07)
Basophils Absolute: 0 10*3/uL (ref 0.0–0.1)
Basophils Relative: 0 %
Eosinophils Absolute: 0.1 10*3/uL (ref 0.0–0.5)
Eosinophils Relative: 1 %
HCT: 50.7 % — ABNORMAL HIGH (ref 36.0–46.0)
Hemoglobin: 16.3 g/dL — ABNORMAL HIGH (ref 12.0–15.0)
Immature Granulocytes: 0 %
Lymphocytes Relative: 17 %
Lymphs Abs: 1.4 10*3/uL (ref 0.7–4.0)
MCH: 28.8 pg (ref 26.0–34.0)
MCHC: 32.1 g/dL (ref 30.0–36.0)
MCV: 89.7 fL (ref 80.0–100.0)
Monocytes Absolute: 0.4 10*3/uL (ref 0.1–1.0)
Monocytes Relative: 5 %
Neutro Abs: 6 10*3/uL (ref 1.7–7.7)
Neutrophils Relative %: 77 %
Platelets: 226 10*3/uL (ref 150–400)
RBC: 5.65 MIL/uL — ABNORMAL HIGH (ref 3.87–5.11)
RDW: 13.9 % (ref 11.5–15.5)
WBC: 7.8 10*3/uL (ref 4.0–10.5)
nRBC: 0 % (ref 0.0–0.2)

## 2023-08-29 LAB — URINALYSIS, ROUTINE W REFLEX MICROSCOPIC
Bacteria, UA: NONE SEEN
Bilirubin Urine: NEGATIVE
Glucose, UA: NEGATIVE mg/dL
Ketones, ur: NEGATIVE mg/dL
Leukocytes,Ua: NEGATIVE
Nitrite: NEGATIVE
Protein, ur: NEGATIVE mg/dL
Specific Gravity, Urine: 1.009 (ref 1.005–1.030)
pH: 5 (ref 5.0–8.0)

## 2023-08-29 LAB — LIPASE, BLOOD: Lipase: 34 U/L (ref 11–51)

## 2023-08-29 MED ORDER — KETOROLAC TROMETHAMINE 15 MG/ML IJ SOLN
15.0000 mg | Freq: Once | INTRAMUSCULAR | Status: AC
  Administered 2023-08-29: 15 mg via INTRAVENOUS
  Filled 2023-08-29: qty 1

## 2023-08-29 MED ORDER — SODIUM CHLORIDE 0.9 % IV BOLUS
1000.0000 mL | Freq: Once | INTRAVENOUS | Status: AC
  Administered 2023-08-29: 1000 mL via INTRAVENOUS

## 2023-08-29 MED ORDER — LORAZEPAM 0.5 MG PO TABS
0.5000 mg | ORAL_TABLET | Freq: Once | ORAL | Status: AC
  Administered 2023-08-29: 0.5 mg via ORAL
  Filled 2023-08-29: qty 1

## 2023-08-29 NOTE — Discharge Instructions (Addendum)
Please use Tylenol or ibuprofen for pain.  You may use 600 mg ibuprofen every 6 hours or 1000 mg of Tylenol every 6 hours.  You may choose to alternate between the 2.  This would be most effective.  Not to exceed 4 g of Tylenol within 24 hours.  Not to exceed 3200 mg ibuprofen 24 hours.  Please return to the emergency department if you have significant worsening pain despite treatment.

## 2023-08-29 NOTE — ED Notes (Signed)
IV in per IV team at this time

## 2023-08-29 NOTE — ED Notes (Signed)
Followed up with IV team for IV access for patient

## 2023-08-29 NOTE — ED Provider Notes (Signed)
Accepted handoff at shift change from [facility]. Please see prior provider note for more detail.   Briefly: Patient is 47 y.o.   DDX: concern for right-sided flank pain, vomiting, fever  Plan: CT abdomen pelvis without significant abnormality, UA with no evidence of UTI, CBC without Leukocytosis or other significant abnormality, plan to administer Toradol, fluid bolus, and reassess.  Started amoxicillin yesterday at urgent care for unknown etiology of her abdominal pain/other symptoms, will have her continue this medication. CMP unremarkable. Unclear etiology for abdominal pain.   On reevaluation patient's pain is mildly improved, discussed possibility of renal colic versus other unclear etiologies for her symptoms including gastritis, gastroenteritis.  Will administer Toradol, fluid bolus as her hemoconcentrated CBC suggest that she may be mildly dehydrated, otherwise discussed with patient and I think that she is stable for discharge, encouraged possible urology follow-up   [place] 08/29/23 [id number]    Kommor, [place], MD 08/30/23 0139

## 2023-08-29 NOTE — ED Provider Notes (Signed)
[facility] AT [facility] Provider Note   CSN: [id number] Arrival date & time: 08/29/23  1324     History  Chief Complaint  Patient presents with   Flank Pain    Patient c/o right side flank pain for the last 4 days. Seen at [facility] yesterday. Hx kidney kidney stones. Last one in 2017 and they had to do emergency surgery per patient.     Sandra Miller is a 47 y.o. female.  Patient with past medical history significant for GERD, nephrolithiasis, obstructive pyelonephritis presents to the emergency department via EMS complaining of right-sided flank and lower right lower quadrant abdominal pain.  Patient states pain has been ongoing for approximately 4 days.  She was seen in urgent care yesterday and was prescribed amoxicillin.  She has taken 1 dose of the amoxicillin.  She called EMS tonight because she is having nausea and vomiting with no relief from oral Phenergan.  Severe pain.  The patient is worried because she reports having a history of a kidney stone requiring emergency surgery.  She denies chest pain, shortness of breath.   Flank Pain       Home Medications Prior to Admission medications   Medication Sig Start Date End Date Taking? Authorizing Provider  amoxicillin-clavulanate (AUGMENTIN) 875-125 MG tablet Take 1 tablet by mouth 2 (two) times daily. For 10 days 08/28/23 09/07/23 Yes [provider]  famotidine (PEPCID) 40 MG tablet Take 1 tablet (40 mg total) by mouth daily. 06/02/23  Yes Doree Albee, PA-C  FLUoxetine (PROZAC) 10 MG capsule Take 3 capsules (30 mg total) by mouth daily. 06/22/23  Yes Arfeen, Phillips Grout, MD  Fremanezumab-vfrm (AJOVY) 225 MG/1.5ML SOAJ Inject 225 mg into the skin every 30 (thirty) days. 08/22/23  Yes Anson Fret, MD  gabapentin (NEURONTIN) 300 MG capsule Take 1 capsule (300 mg) by mouth daily. 08/14/23  Yes Doree Albee, PA-C  LamoTRIgine (LAMICTAL XR) 200 MG TB24 24 hour tablet Take 1 tablet (200  mg total) by mouth at bedtime. 08/22/23  Yes Anson Fret, MD  letrozole [facility]) 2.5 MG tablet Take 1 tablet by mouth daily. 12/01/22  Yes Serena Croissant, MD  lisinopril (ZESTRIL) 10 MG tablet Take 1 tablet (10 mg total) by mouth daily. Stop the Lisinopril/HCTZ combination 06/12/23  Yes Marcine Matar, MD  loratadine (CLARITIN REDITABS) 10 MG dissolvable tablet Take 10 mg by mouth daily. OTC   Yes [provider]  LORazepam (ATIVAN) 1 MG tablet Take 1 tablet (1 mg total) by mouth 3 (three) times daily as needed for anxiety 06/22/23  Yes Arfeen, Phillips Grout, MD  Magnesium 400 MG TABS Take 800 mg by mouth 2 (two) times daily. 04/22/23  Yes Marcine Matar, MD  methimazole (TAPAZOLE) 5 MG tablet Take one tablet (5 mg dose) by mouth daily. Patient taking differently: Take 5 mg by mouth daily. 09/22/22  Yes   Multiple Vitamin (MULTIVITAMIN WITH MINERALS) TABS tablet Take 1 tablet by mouth daily.   Yes [provider]  omeprazole (PRILOSEC) 40 MG capsule Take 1 capsule (40 mg total) by mouth daily before breakfast. 06/02/23  Yes Quentin Mulling R, PA-C  potassium chloride SA (KLOR-CON M) 20 MEQ tablet Take 1 tablet (20 mEq) by mouth daily. 07/25/23  Yes Marcine Matar, MD  promethazine (PHENERGAN) 12.5 MG tablet Take 1 tablet (12.5 mg total) by mouth every 8 (eight) hours as needed for nausea or vomiting. 06/02/23  Yes Quentin Mulling  R, PA-C  propranolol (INDERAL) 10 MG tablet Take 1 tablet by mouth 2 times daily. 07/12/23  Yes Marcine Matar, MD  spironolactone (ALDACTONE) 25 MG tablet Take 0.5 tablets (12.5 mg total) by mouth daily. 03/02/23  Yes Marcine Matar, MD  tamsulosin (FLOMAX) 0.4 MG CAPS capsule Take 0.4 mg by mouth daily. For 10 days 08/28/23 09/07/23 Yes [provider]  traMADol (ULTRAM) 50 MG tablet Take 50 mg by mouth every 8 (eight) hours as needed for severe pain (pain score 7-10). Patient picked up from Pharmacy, hasn't used. 08/29/2023. 08/28/23  09/02/23 Yes [provider]  Ubrogepant (UBRELVY) 50 MG TABS Take 50 mg as needed for migraine, a second dose may be taken at least 2 hours after the initial dose. DO NOT EXCEED 2 doses in 24 hours. 08/22/23  Yes Anson Fret, MD      Allergies    Dilaudid [hydromorphone hcl], Fentanyl, Aspirin, Depakote [divalproex sodium], and Minocycline    Review of Systems   Review of Systems  Genitourinary:  Positive for flank pain.    Physical Exam Updated Vital Signs BP (!) 135/99   Pulse 93   Temp 98.5 F (36.9 C)   Resp 18   SpO2 98%  Physical Exam Vitals and nursing note reviewed.  Constitutional:      Appearance: She is well-developed.  HENT:     Head: Normocephalic and atraumatic.  Eyes:     Conjunctiva/sclera: Conjunctivae normal.  Cardiovascular:     Rate and Rhythm: Normal rate and regular rhythm.     Heart sounds: No murmur heard. Pulmonary:     Effort: Pulmonary effort is normal. No respiratory distress.     Breath sounds: Normal breath sounds.  Abdominal:     Palpations: Abdomen is soft.     Tenderness: There is abdominal tenderness. There is right CVA tenderness.     Comments: Right CVA, right lower quadrant tenderness  Musculoskeletal:        General: No swelling.     Cervical back: Neck supple.  Skin:    General: Skin is warm and dry.     Capillary Refill: Capillary refill takes less than 2 seconds.  Neurological:     Mental Status: She is alert.  Psychiatric:        Mood and Affect: Mood normal.     ED Results / Procedures / Treatments   Labs (all labs ordered are listed, but only abnormal results are displayed) Labs Reviewed  URINALYSIS, ROUTINE W REFLEX MICROSCOPIC - Abnormal; Notable for the following components:      Result Value   Color, Urine STRAW (*)    APPearance HAZY (*)    Hgb urine dipstick SMALL (*)    All other components within normal limits  CBC WITH DIFFERENTIAL/PLATELET  COMPREHENSIVE METABOLIC PANEL  LIPASE, BLOOD     EKG None  Radiology CT ABDOMEN PELVIS WO CONTRAST Result Date: 08/29/2023 CLINICAL DATA:  47 year old female with right flank and abdomen pain for 4 days. History of treated breast and vulva cancer. EXAM: CT ABDOMEN AND PELVIS WITHOUT CONTRAST TECHNIQUE: Multidetector CT imaging of the abdomen and pelvis was performed following the standard protocol without IV contrast. RADIATION DOSE REDUCTION: This exam was performed according to the departmental dose-optimization program which includes automated exposure control, adjustment of the mA and/or kV according to patient size and/or use of iterative reconstruction technique. COMPARISON:  CT Abdomen and Pelvis 04/11/2023 and earlier. FINDINGS: Lower chest: Decreased but not resolved lung  base atelectasis compared to August. No pericardial or pleural effusion. Normal heart size. Hepatobiliary: Negative noncontrast liver and gallbladder. Pancreas: Negative. Spleen: Negative. Adrenals/Urinary Tract: Normal adrenal glands. Punctate bilateral nephrolithiasis, lower pole on the left and upper and lower pole involvement on the right. Mild asymmetry of the proximal ureters, but no ureteral calculus is identified, and both ureters appear decompressed in the pelvis. Numerous bilateral pelvic phleboliths, but stable compared to August. The bladder wall appears mildly thickened and indistinct but the bladder is more decompressed today. No calculus identified within the bladder. Stomach/Bowel: Decompressed large bowel from the splenic flexure through the rectum. Upstream mild transverse and right colon retained gas and stool. Midline cecum partially located in the pelvis. Midline appendix appears decompressed and normal on coronal image 79. Terminal ileum is within normal limits. No dilated small bowel. Noncontrast stomach and duodenum appear negative. No pneumoperitoneum, free fluid identified. Vascular/Lymphatic: Aortoiliac calcified atherosclerosis. Normal caliber  abdominal aorta. Vascular patency is not evaluated in the absence of IV contrast. No lymphadenopathy identified. Reproductive: Uterus and ovaries appear to be surgically absent. Stable pelvic phleboliths. Incompletely visible external genitalia appears unremarkable. Other: No pelvis free fluid. Musculoskeletal: No acute or suspicious osseous lesion. Chronic lumbosacral junction disc, endplate, and posterior element degeneration. Chronic vacuum disc there. IMPRESSION: 1. Punctate bilateral nephrolithiasis with no convincing ureteral calculus or obstructive uropathy. Numerous pelvic phleboliths. 2. Mild bladder wall thickening, indistinct appearance although might be artifact from relative bladder decompression. 3. No other acute or inflammatory process identified in the noncontrast abdomen or pelvis. Normal appendix. 4.  Aortic Atherosclerosis (ICD10-I70.0). Electronically Signed   By: Odessa Fleming M.D.   On: 08/29/2023 05:11    Procedures Procedures    Medications Ordered in ED Medications  LORazepam (ATIVAN) tablet 0.5 mg (0.5 mg Oral Given 08/29/23 0439)    ED Course/ Medical Decision Making/ A&P                                 Medical Decision Making Amount and/or Complexity of Data Reviewed Labs: ordered. Radiology: ordered.  Risk Prescription drug management.   This patient presents to the ED for concern of abdominal pain, this involves an extensive number of treatment options, and is a complaint that carries with it a high risk of complications and morbidity.  The differential diagnosis includes nephrolithiasis, hydronephrosis, pyelonephritis, appendicitis, cholecystitis, gastroenteritis, colitis, others   Co morbidities that complicate the patient evaluation  Breast cancer, hypertension, vulvar cancer   Additional history obtained:  Additional history obtained from EMS External records from outside source obtained and reviewed including primary care notes   Lab Tests:  I  Ordered labs.  UA hazy with small hemoglobin, no signs of infection.  Lipase, CMP, CBC pending.   Imaging Studies ordered:  I ordered imaging studies including CT abdomen pelvis without contrast I independently visualized and interpreted imaging which showed  1. Punctate bilateral nephrolithiasis with no convincing ureteral  calculus or obstructive uropathy. Numerous pelvic phleboliths.  2. Mild bladder wall thickening, indistinct appearance although  might be artifact from relative bladder decompression.  3. No other acute or inflammatory process identified in the  noncontrast abdomen or pelvis. Normal appendix.  4.  Aortic Atherosclerosis   I agree with the radiologist interpretation   Cardiac Monitoring: / EKG:  The patient was maintained on a cardiac monitor.  I personally viewed and interpreted the cardiac monitored which showed an underlying rhythm of:  Sinus rhythm    Problem List / ED Course / Critical interventions / Medication management   I ordered medication including Ativan for anxiety Reevaluation of the patient after these medicines showed that the patient improved I have reviewed the patients home medicines and have made adjustments as needed   Social Determinants of Health:  Patient is a tobacco user   Test / Admission - Considered:  Patient with no significant acute findings on imaging.  Lab work delayed by difficult IV start.  IV team evaluation pending.  Patient care being transferred to Luther Hearing, PA-C at shift handoff.  Disposition pending reevaluation of patient and results of laboratory work.  No surgical problem notable on CT imaging to explain patient's pain.         Final Clinical Impression(s) / ED Diagnoses Final diagnoses:  Right flank pain    Rx / DC Orders ED Discharge Orders     None         Pamala Duffel 08/29/23 0618    Glendora Score, MD 08/30/23 [phone]

## 2023-08-29 NOTE — ED Notes (Signed)
IV access attempted x 2 no success. Left extremity restricted.

## 2023-09-07 ENCOUNTER — Telehealth: Payer: Self-pay | Admitting: *Deleted

## 2023-09-07 ENCOUNTER — Inpatient Hospital Stay: Payer: No Typology Code available for payment source

## 2023-09-07 NOTE — Telephone Encounter (Signed)
 CALLED PATIENT TO INFORM OF FU APPT. WITH DR. Pricilla Holm ON 12-14-23- ARRIVAL TIME- 2:30 PM, LVM FOR A RETURN CALL

## 2023-09-07 NOTE — Telephone Encounter (Signed)
 Talbert Forest from ([facility]) called office.  Pt is scheduled for a follow up on April 10  at 2:45 with Dr. Pricilla Holm.  Talbert Forest to notify pt of appointment date and time.

## 2023-09-08 ENCOUNTER — Encounter: Payer: Self-pay | Admitting: Hematology and Oncology

## 2023-09-08 ENCOUNTER — Ambulatory Visit: Payer: BC Managed Care – PPO | Attending: Internal Medicine

## 2023-09-08 ENCOUNTER — Other Ambulatory Visit: Payer: Self-pay

## 2023-09-08 DIAGNOSIS — M6281 Muscle weakness (generalized): Secondary | ICD-10-CM | POA: Diagnosis present

## 2023-09-08 DIAGNOSIS — R29898 Other symptoms and signs involving the musculoskeletal system: Secondary | ICD-10-CM | POA: Insufficient documentation

## 2023-09-08 DIAGNOSIS — M25512 Pain in left shoulder: Secondary | ICD-10-CM | POA: Insufficient documentation

## 2023-09-08 NOTE — Therapy (Addendum)
 OUTPATIENT PHYSICAL THERAPY UPPER EXTREMITY EVALUATION   Patient Name: Sandra Miller MRN: [id number] DOB:October 21, 1975, 48 y.o., female Today's Date: 09/08/2023  END OF SESSION:  PT End of Session - 09/08/23 1145     Visit Number 1    Number of Visits 17    Date for PT Re-Evaluation 11/03/23    Authorization Type BC[employer]    PT Start Time 1005    PT Stop Time 1047    PT Time Calculation (min) 42 min    Activity Tolerance Patient limited by fatigue    Behavior During Therapy WFL for tasks assessed/performed             Past Medical History:  Diagnosis Date   Arthritis    Bipolar 1 disorder (HCC)    Cancer (HCC)    vulva, and breast   Complication of anesthesia    wakes up during procedures   Depression    GAD (generalized anxiety disorder)    Genital HSV    currently per pt  no break out 03-22-2016    GERD (gastroesophageal reflux disease)    Graves disease    Hiatal hernia    History of cervical dysplasia    2012 laser ablation   History of esophageal dilatation    for dysphasia -- x2 dilated   History of gastric ulcer    History of Helicobacter pylori infection    remote hx   History of hidradenitis suppurativa    gets all over body intermittantly     History of hypertension    no issue since stopped drinking alcohol 2014   History of kidney stones    History of panic attacks    History of radiation therapy 03/16/2020-05/08/2020   vulva  Dr Lynwood Nasuti   History of radiation therapy 09/09/2020-10/23/2020   left chest wall/left SCV   Dr Lynwood Nasuti   Hypertension    Iron deficiency anemia    Left ureteral stone    OCD (obsessive compulsive disorder)    PONV (postoperative nausea and vomiting)    Pre-diabetes    PTSD (post-traumatic stress disorder)    Recovering alcoholic in remission (HCC)    since 2014   RLS (restless legs syndrome)    Smokers' cough (HCC)    Thyroid  disease    Urgency of urination    Yeast infection involving the vagina and  surrounding area    secondary to taking antibiotic   Past Surgical History:  Procedure Laterality Date   CESAREAN SECTION  1995   w/  Bilateral Tubal Ligation   COLONOSCOPY  last one 08-09-2013   CYSTOSCOPY W/ URETERAL STENT PLACEMENT Left 03/29/2016   Procedure: CYSTOSCOPY WITH STENT REPLACEMENT;  Surgeon: Redell Lynwood Napoleon, MD;  Location: [facility];  Service: Urology;  Laterality: Left;   CYSTOSCOPY WITH RETROGRADE PYELOGRAM, URETEROSCOPY AND STENT PLACEMENT Left 03/08/2016   Procedure: CYSTOSCOPY WITH  LEFT RETROGRADE PYELOGRAM, AND STENT PLACEMENT;  Surgeon: Redell Lynwood Napoleon, MD;  Location: [facility];  Service: Urology;  Laterality: Left;   CYSTOSCOPY/RETROGRADE/URETEROSCOPY/STONE EXTRACTION WITH BASKET Left 03/29/2016   Procedure: CYSTOSCOPY/RETROGRADE/URETEROSCOPY/STONE EXTRACTION WITH BASKET;  Surgeon: Redell Lynwood Napoleon, MD;  Location: [facility];  Service: Urology;  Laterality: Left;   ENDOMETRIAL ABLATION W/ NOVASURE  04-01-2010   ESOPHAGOGASTRODUODENOSCOPY  last one 08-09-2013   KNEE ARTHROSCOPY Left as teen   LASER ABLATION OF THE CERVIX  2012 approx   MASTECTOMY WITH AXILLARY LYMPH NODE DISSECTION Left 07/27/2020   Procedure: LEFT MASTECTOMY  WITH LEFT RADIOACTIVE SEED GUIDED TARGETED AXILLARY LYMPH NODE DISSECTION;  Surgeon: Ethyl Lenis, MD;  Location: [facility];  Service: General;  Laterality: Left;   PORT-A-CATH REMOVAL Right 05/18/2021   Procedure: REMOVAL PORT-A-CATH;  Surgeon: Belinda Cough, MD;  Location: [facility];  Service: General;  Laterality: Right;   PORTACATH PLACEMENT Right 03/06/2020   Procedure: INSERTION PORT-A-CATH WITH ULTRASOUND GUIDANCE;  Surgeon: Ethyl Lenis, MD;  Location: [facility];  Service: General;  Laterality: Right;   ROBOTIC ASSISTED TOTAL HYSTERECTOMY WITH BILATERAL SALPINGO OOPHERECTOMY N/A 05/18/2021   Procedure: XI ROBOTIC ASSISTED TOTAL HYSTERECTOMY WITH BILATERAL SALPINGO OOPHORECTOMY;  Surgeon: Eloy Herring, MD;  Location: [facility];  Service: Gynecology;  Laterality: N/A;   TRANSTHORACIC ECHOCARDIOGRAM  05-19-2006   lvsf normal, ef 55-65%, there was mild flattening of the interventricular septum during diastoli/  RV size at upper limits normal   TUBAL LIGATION     VULVA /PERINEUM BIOPSY N/A 05/18/2021   Procedure: VULVAR BIOPSY;  Surgeon: Eloy Herring, MD;  Location: [facility];  Service: Gynecology;  Laterality: N/A;   WISDOM TOOTH EXTRACTION  age 51 's   Patient Active Problem List   Diagnosis Date Noted   Chronic migraine without aura, with intractable migraine, so stated, with status migrainosus 08/22/2023   Mood disorder (HCC) 05/29/2023   Fatigue 05/15/2023   Prolonged QT interval 04/10/2023   Dizziness 04/10/2023   Provoked seizures (HCC) 03/31/2023   Respiratory depression 03/31/2023   Status epilepticus (HCC) 03/31/2023   Migraine with aura and without status migrainosus, not intractable 10/03/2022   Pain in right knee 09/07/2022   Bilateral primary osteoarthritis of knee 09/07/2022   Chronic migraine without aura without status migrainosus, not intractable 07/07/2022   Graves disease 05/10/2022   Sebaceous cyst 12/06/2021   Pain in left knee 11/03/2021   Hyperthyroidism 08/13/2021   Bipolar 1 disorder (HCC) 05/07/2021   Obesity (BMI 30.0-34.9) 05/07/2021   Hyperlipidemia 08/12/2020   Prediabetes 08/12/2020   Hypomagnesemia 08/12/2020   Mutation in BRIP1 gene 06/24/2020   Hypokalemia 06/17/2020   Dehydration    Colitis 06/16/2020   Port-A-Cath in place 03/11/2020   Malignant neoplasm of upper-inner quadrant of left breast in female, estrogen receptor positive (HCC) 02/25/2020   Vulvar cancer (HCC) 01/29/2020   Vulvar ulceration 01/23/2020   Essential hypertension 10/31/2019   Nausea and vomiting 12/27/2018   Seasonal allergic rhinitis 12/27/2018   History of ELISA positive for HSV 10/01/2018   Restless leg syndrome 04/01/2016   Nephrolithiasis 03/08/2016   Obstructive  pyelonephritis 03/08/2016   Normocytic anemia 03/08/2016   Hidradenitis suppurativa of left axilla 11/03/2015   Loss of weight 09/08/2015   Neck pain on left side 10/22/2014   Genital warts 05/20/2014   GERD (gastroesophageal reflux disease) 12/26/2012   Functional dyspepsia 12/26/2012   Tobacco abuse 08/22/2012   Herpes simplex virus (HSV) infection 02/03/2007    PCP: Vicci Barnie NOVAK, MD  REFERRING PROVIDER: Vicci Barnie NOVAK, MD  REFERRING DIAG: R29.898 (ICD-10-CM) - Weakness of both hands   THERAPY DIAG:  Muscle weakness (generalized) - Plan: PT plan of care cert/re-cert  Left shoulder pain, unspecified chronicity - Plan: PT plan of care cert/re-cert  Rationale for Evaluation and Treatment: Rehabilitation  ONSET DATE: Chronic  SUBJECTIVE:  SUBJECTIVE STATEMENT: Pt presents to PT with reports of chronic bilateral UE and hand weakness after multiple seizures and hospitalization last year. Her walking has improved but she notes continued decrease in her activity tolerance. 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 Does continue to have significant L shoulder pain and discomfort which has been present from first PT episode last year.    Hand dominance: Right  PERTINENT HISTORY: Seizures, HTN, Cancer  PAIN:  Are you having pain?  Yes: NPRS scale: left shoulder Pain location: 0/10 Worst: 8/10 Pain description: sharp, tight Aggravating factors: none Relieving factors: salonpas patches, heat  PRECAUTIONS: None  RED FLAGS: None   WEIGHT BEARING RESTRICTIONS: No  FALLS:  Has patient fallen in last 6 months? No  LIVING ENVIRONMENT: Lives with: lives with their family Lives in: House/apartment Stairs:  No Has following equipment at home: Environmental Consultant - 4 wheeled, bed side commode, and Grab bars  OCCUPATION: CNA - [employer]  PLOF: Independent  PATIENT GOALS: improve her UE strength and get back/improve her functional activity tolerance  NEXT MD VISIT: 10/02/2023  OBJECTIVE:  Note: Objective measures were completed at Evaluation unless otherwise noted.  DIAGNOSTIC FINDINGS:  See imaging   PATIENT SURVEYS :  FOTO: 45% function; 58% predicted   COGNITION: Overall cognitive status: Within functional limits for tasks assessed     SENSATION: Light touch: Impaired - L UE (hx of cervical radiculopathy)  POSTURE: Rounded shoulders, fwd head  UPPER EXTREMITY ROM:   Active ROM Right eval Left eval  Shoulder flexion [facility] 115  Shoulder extension    Shoulder abduction    Shoulder adduction    Shoulder internal rotation    Shoulder external rotation    Elbow flexion    Elbow extension    Wrist flexion    Wrist extension    Wrist ulnar deviation    Wrist radial deviation    Wrist pronation    Wrist supination    (Blank rows = not tested)  UPPER EXTREMITY MMT:  MMT Right eval Left eval  Shoulder flexion 4/5 3+/5  Shoulder extension    Shoulder abduction 4/5 3+/5  Shoulder adduction    Shoulder internal rotation 4/5 3+/5  Shoulder external rotation 4/5 3+/5  Middle trapezius    Lower trapezius    Elbow flexion 4/5 3+/5  Elbow extension 4/5 3+/5  Wrist flexion    Wrist extension    Wrist ulnar deviation    Wrist radial deviation    Wrist pronation    Wrist supination    Grip strength (lbs) 60 45  (Blank rows = not tested)  SHOULDER SPECIAL TESTS: Impingement tests: Painful arc test: positive - left SLAP lesions: DNT Instability tests: DNT Rotator cuff assessment: DNT Biceps assessment: DNT  JOINT MOBILITY TESTING:  DNT  PALPATION:  TTP to bilateral upper trap, L infraspinatus   TREATENT: [facility] Adult PT Treatment:                                                 DATE: 09/08/2023 Therapeutic Exercise: Putty squeeze x 60 each Seated scapular retraction x 5 Row x 5 GTB Shoulder ext x 5 GTB Seated bilateral ER RTB x 5 Seated horizontal abd x 5 RTB Seated bicep curls to  fatigue 2#  PATIENT EDUCATION: Education details: eval findings, FOTO, HEP, POC Person educated: Patient Education method: Explanation, Demonstration, and Handouts Education comprehension: verbalized understanding and returned demonstration  HOME EXERCISE PROGRAM: Access Code: [email or web address] URL: [email or web address] Date: 09/08/2023 Prepared by: Alm Kingdom  Exercises - Putty Squeezes  - 1-2 x daily - 7 x weekly - 2 min hold - Seated Scapular Retraction  - 1-2 x daily - 7 x weekly - 2 sets - 10 reps - Scapular Retraction with Resistance  - 1-2 x daily - 7 x weekly - 2-3 sets - 10 reps - green band hold - Shoulder extension with resistance - Neutral  - 1-2 x daily - 7 x weekly - 2-3 sets - 10 reps - green band hold - Shoulder External Rotation and Scapular Retraction with Resistance  - 1-2 x daily - 7 x weekly - 2-3 sets - 10 reps - red band hold - Standing Shoulder Horizontal Abduction with Resistance  - 1-2 x daily - 7 x weekly - 2-3 sets - 10 reps - red band hold - Seated Bicep Curls Supinated with Dumbbells  - 1-2 x daily - 7 x weekly - 1-2 sets - to fatigue hold  ASSESSMENT:  CLINICAL IMPRESSION: Patient is a 48 y.o. F who was seen today for physical therapy evaluation and treatment for bilateral UE weakness and decreased functional ability. Physical findings are consistent with timeline and MD impression as pt demonstrates decrease in UE strength and general functional ability. FOTO score demonstrates decrease in subjective functional ability below PLOF. 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    OBJECTIVE IMPAIRMENTS: Abnormal gait, decreased activity tolerance,  decreased endurance, decreased mobility, difficulty walking, decreased ROM, decreased strength, impaired UE functional use, postural dysfunction, and pain   ACTIVITY LIMITATIONS: carrying, lifting, transfers, reach over head, hygiene/grooming, and caring for others  PARTICIPATION LIMITATIONS: meal prep, cleaning, laundry, driving, shopping, community activity, occupation, and yard work  PERSONAL FACTORS: Time since onset of injury/illness/exacerbation and 3+ comorbidities: Seizures, HTN, Cancer  are also affecting patient's functional outcome.   REHAB POTENTIAL: Good  CLINICAL DECISION MAKING: Evolving/moderate complexity  EVALUATION COMPLEXITY: Moderate  GOALS: Goals reviewed with patient? No  SHORT TERM GOALS: Target date: 09/29/2023   Pt will be compliant and knowledgeable with initial HEP for improved comfort and carryover Baseline: initial HEP given  Goal status: INITIAL  2.  Pt will self report left shoulder pain no greater than 5/10 for improved comfort and functional ability Baseline: 8/10 at worst Goal status: INITIAL   LONG TERM GOALS: Target date: 11/03/2023   Pt will improve FOTO function score to no less than 58% as proxy for functional improvement for improved functional ability with work and home ADLs Baseline: 45% function Goal status: INITIAL   2.  Pt will self report left shoulder pain no greater than 1-2/10 for improved comfort and functional ability Baseline: 8/10 at worst Goal status: INITIAL   3.  Pt will improve bilateral UE MMT to no less than 4+/5 for improved comfort and return to work ability as CNA Baseline: see MMT chart Goal status: INITIAL  4.  Pt will improve bilateral grip strength to no less than 75lbs for return to age-related normative value and improved functional ability Baseline: see strength chart Goal status: INITIAL  5.  Pt will be able to perform >30 minutes of standing activity without need for rest break in order to  improve  functional activity tolerance towards return to work Baseline: 10 minutes Goal status: INITIAL  PLAN: PT FREQUENCY: 2x/week  PT DURATION: 8 weeks  PLANNED INTERVENTIONS: [id number]- PT Re-evaluation, 97110-Therapeutic exercises, 97530- Therapeutic activity, 97112- Neuromuscular re-education, 97535- Self Care, [id number]- Manual therapy, 97014- Electrical stimulation (unattended), [id number]- Electrical stimulation (manual), 97016- Vasopneumatic device, Dry Needling, Cryotherapy, and Moist heat  PLAN FOR NEXT SESSION: assess HEP response, global UE strengthening    Alm JAYSON Kingdom, PT 09/08/2023, 11:47 AM

## 2023-09-09 ENCOUNTER — Other Ambulatory Visit (HOSPITAL_COMMUNITY): Payer: Self-pay

## 2023-09-09 ENCOUNTER — Other Ambulatory Visit: Payer: Self-pay | Admitting: Internal Medicine

## 2023-09-09 DIAGNOSIS — I10 Essential (primary) hypertension: Secondary | ICD-10-CM

## 2023-09-10 ENCOUNTER — Encounter: Payer: Self-pay | Admitting: Hematology and Oncology

## 2023-09-10 ENCOUNTER — Other Ambulatory Visit (HOSPITAL_COMMUNITY): Payer: Self-pay

## 2023-09-10 MED ORDER — LISINOPRIL 10 MG PO TABS
10.0000 mg | ORAL_TABLET | Freq: Every day | ORAL | 0 refills | Status: DC
  Filled 2023-09-10 – 2023-09-13 (×3): qty 90, 90d supply, fill #0

## 2023-09-11 ENCOUNTER — Other Ambulatory Visit (HOSPITAL_COMMUNITY): Payer: Self-pay

## 2023-09-11 ENCOUNTER — Ambulatory Visit: Payer: BC Managed Care – PPO | Admitting: Physical Therapy

## 2023-09-11 ENCOUNTER — Ambulatory Visit: Payer: Self-pay | Admitting: Internal Medicine

## 2023-09-12 ENCOUNTER — Encounter: Payer: Self-pay | Admitting: Hematology and Oncology

## 2023-09-12 ENCOUNTER — Other Ambulatory Visit (HOSPITAL_COMMUNITY): Payer: Self-pay

## 2023-09-12 ENCOUNTER — Other Ambulatory Visit: Payer: Self-pay

## 2023-09-13 ENCOUNTER — Other Ambulatory Visit: Payer: Self-pay | Admitting: Physician Assistant

## 2023-09-13 ENCOUNTER — Other Ambulatory Visit (HOSPITAL_COMMUNITY): Payer: Self-pay

## 2023-09-13 ENCOUNTER — Other Ambulatory Visit: Payer: Self-pay

## 2023-09-13 ENCOUNTER — Ambulatory Visit: Payer: BC Managed Care – PPO | Admitting: Physical Therapy

## 2023-09-13 MED ORDER — PROMETHAZINE HCL 12.5 MG PO TABS
12.5000 mg | ORAL_TABLET | Freq: Three times a day (TID) | ORAL | 1 refills | Status: DC | PRN
  Filled 2023-09-13: qty 30, 10d supply, fill #0
  Filled 2023-10-09: qty 30, 10d supply, fill #1

## 2023-09-14 ENCOUNTER — Encounter: Payer: Self-pay | Admitting: Nurse Practitioner

## 2023-09-18 ENCOUNTER — Encounter: Payer: Self-pay | Admitting: Neurology

## 2023-09-18 ENCOUNTER — Other Ambulatory Visit: Payer: Self-pay

## 2023-09-19 ENCOUNTER — Ambulatory Visit: Payer: BC Managed Care – PPO | Admitting: Physical Therapy

## 2023-09-21 ENCOUNTER — Ambulatory Visit: Payer: BC Managed Care – PPO | Admitting: Physical Therapy

## 2023-09-21 ENCOUNTER — Other Ambulatory Visit: Payer: Self-pay

## 2023-09-21 ENCOUNTER — Encounter: Payer: Self-pay | Admitting: Physical Therapy

## 2023-09-21 DIAGNOSIS — M6281 Muscle weakness (generalized): Secondary | ICD-10-CM | POA: Diagnosis not present

## 2023-09-21 DIAGNOSIS — M25512 Pain in left shoulder: Secondary | ICD-10-CM

## 2023-09-21 NOTE — Therapy (Signed)
OUTPATIENT PHYSICAL THERAPY TREATMENT   Patient Name: Sandra Miller MRN: [id number] DOB:Oct 03, 1975, 48 y.o., female Today's Date: 09/21/2023   END OF SESSION:  PT End of Session - 09/21/23 1421     Visit Number 2    Number of Visits 17    Date for PT Re-Evaluation 11/03/23    Authorization Type BCBS / MCD    Authorization Time Period 09/13/2023 - 09/26/2023    Authorization - Visit Number 1    Authorization - Number of Visits 3    PT Start Time 1400    PT Stop Time 1440    PT Time Calculation (min) 40 min    Activity Tolerance Patient tolerated treatment well    Behavior During Therapy WFL for tasks assessed/performed              Past Medical History:  Diagnosis Date   Arthritis    Bipolar 1 disorder (HCC)    Cancer (HCC)    vulva, and breast   Complication of anesthesia    wakes up during procedures   Depression    GAD (generalized anxiety disorder)    Genital HSV    currently per pt  no break out 03-22-2016    GERD (gastroesophageal reflux disease)    Graves disease    Hiatal hernia    History of cervical dysplasia    2012 laser ablation   History of esophageal dilatation    for dysphasia -- x2 dilated   History of gastric ulcer    History of Helicobacter pylori infection    remote hx   History of hidradenitis suppurativa    "gets all over body intermittantly"     History of hypertension    no issue since stopped drinking alcohol 2014   History of kidney stones    History of panic attacks    History of radiation therapy 03/16/2020-05/08/2020   vulva  Dr Antony Blackbird   History of radiation therapy 09/09/2020-10/23/2020   left chest wall/left SCV   Dr Antony Blackbird   Hypertension    Iron deficiency anemia    Left ureteral stone    OCD (obsessive compulsive disorder)    PONV (postoperative nausea and vomiting)    Pre-diabetes    PTSD (post-traumatic stress disorder)    Recovering alcoholic in remission (HCC)    since 2014   RLS (restless legs syndrome)     Smokers' cough (HCC)    Thyroid disease    Urgency of urination    Yeast infection involving the vagina and surrounding area    secondary to taking antibiotic   Past Surgical History:  Procedure Laterality Date   CESAREAN SECTION  1995   w/  Bilateral Tubal Ligation   COLONOSCOPY  last one 08-09-2013   CYSTOSCOPY W/ URETERAL STENT PLACEMENT Left 03/29/2016   Procedure: CYSTOSCOPY WITH STENT REPLACEMENT;  Surgeon: Hildred Laser, MD;  Location: [facility];  Service: Urology;  Laterality: Left;   CYSTOSCOPY WITH RETROGRADE PYELOGRAM, URETEROSCOPY AND STENT PLACEMENT Left 03/08/2016   Procedure: CYSTOSCOPY WITH  LEFT RETROGRADE PYELOGRAM, AND STENT PLACEMENT;  Surgeon: Hildred Laser, MD;  Location: [facility];  Service: Urology;  Laterality: Left;   CYSTOSCOPY/RETROGRADE/URETEROSCOPY/STONE EXTRACTION WITH BASKET Left 03/29/2016   Procedure: CYSTOSCOPY/RETROGRADE/URETEROSCOPY/STONE EXTRACTION WITH BASKET;  Surgeon: Hildred Laser, MD;  Location: [facility];  Service: Urology;  Laterality: Left;   ENDOMETRIAL ABLATION W/ NOVASURE  04-01-2010   ESOPHAGOGASTRODUODENOSCOPY  last one 08-09-2013   KNEE ARTHROSCOPY  Left as teen   LASER ABLATION OF THE CERVIX  2012 approx   MASTECTOMY WITH AXILLARY LYMPH NODE DISSECTION Left 07/27/2020   Procedure: LEFT MASTECTOMY WITH LEFT RADIOACTIVE SEED GUIDED TARGETED AXILLARY LYMPH NODE DISSECTION;  Surgeon: Ovidio Kin, MD;  Location: [facility];  Service: General;  Laterality: Left;   PORT-A-CATH REMOVAL Right 05/18/2021   Procedure: REMOVAL PORT-A-CATH;  Surgeon: Manus Rudd, MD;  Location: [facility];  Service: General;  Laterality: Right;   PORTACATH PLACEMENT Right 03/06/2020   Procedure: INSERTION PORT-A-CATH WITH ULTRASOUND GUIDANCE;  Surgeon: Ovidio Kin, MD;  Location: [facility];  Service: General;  Laterality: Right;   ROBOTIC ASSISTED TOTAL HYSTERECTOMY WITH BILATERAL SALPINGO OOPHERECTOMY N/A  05/18/2021   Procedure: XI ROBOTIC ASSISTED TOTAL HYSTERECTOMY WITH BILATERAL SALPINGO OOPHORECTOMY;  Surgeon: Adolphus Birchwood, MD;  Location: [facility];  Service: Gynecology;  Laterality: N/A;   TRANSTHORACIC ECHOCARDIOGRAM  05-19-2006   lvsf normal, ef 55-65%, there was mild flattening of the interventricular septum during diastoli/  RV size at upper limits normal   TUBAL LIGATION     VULVA /PERINEUM BIOPSY N/A 05/18/2021   Procedure: VULVAR BIOPSY;  Surgeon: Adolphus Birchwood, MD;  Location: [facility];  Service: Gynecology;  Laterality: N/A;   WISDOM TOOTH EXTRACTION  age 70 's   Patient Active Problem List   Diagnosis Date Noted   Chronic migraine without aura, with intractable migraine, so stated, with status migrainosus 08/22/2023   Mood disorder (HCC) 05/29/2023   Fatigue 05/15/2023   Prolonged QT interval 04/10/2023   Dizziness 04/10/2023   Provoked seizures (HCC) 03/31/2023   Respiratory depression 03/31/2023   Status epilepticus (HCC) 03/31/2023   Migraine with aura and without status migrainosus, not intractable 10/03/2022   Pain in right knee 09/07/2022   Bilateral primary osteoarthritis of knee 09/07/2022   Chronic migraine without aura without status migrainosus, not intractable 07/07/2022   Graves disease 05/10/2022   Sebaceous cyst 12/06/2021   Pain in left knee 11/03/2021   Hyperthyroidism 08/13/2021   Bipolar 1 disorder (HCC) 05/07/2021   Obesity (BMI 30.0-34.9) 05/07/2021   Hyperlipidemia 08/12/2020   Prediabetes 08/12/2020   Hypomagnesemia 08/12/2020   Mutation in BRIP1 gene 06/24/2020   Hypokalemia 06/17/2020   Dehydration    Colitis 06/16/2020   Port-A-Cath in place 03/11/2020   Malignant neoplasm of upper-inner quadrant of left breast in female, estrogen receptor positive (HCC) 02/25/2020   Vulvar cancer (HCC) 01/29/2020   Vulvar ulceration 01/23/2020   Essential hypertension 10/31/2019   Nausea and vomiting 12/27/2018   Seasonal allergic rhinitis 12/27/2018   History  of ELISA positive for HSV 10/01/2018   Restless leg syndrome 04/01/2016   Nephrolithiasis 03/08/2016   Obstructive pyelonephritis 03/08/2016   Normocytic anemia 03/08/2016   Hidradenitis suppurativa of left axilla 11/03/2015   Loss of weight 09/08/2015   Neck pain on left side 10/22/2014   Genital warts 05/20/2014   GERD (gastroesophageal reflux disease) 12/26/2012   Functional dyspepsia 12/26/2012   Tobacco abuse 08/22/2012   Herpes simplex virus (HSV) infection 02/03/2007    PCP: Marcine Matar, MD  REFERRING PROVIDER: Marcine Matar, MD  REFERRING DIAG: R29.898 (ICD-10-CM) - Weakness of both hands   THERAPY DIAG:  Muscle weakness (generalized)  Left shoulder pain, unspecified chronicity  Rationale for Evaluation and Treatment: Rehabilitation  ONSET DATE: Chronic   SUBJECTIVE SUBJECTIVE STATEMENT: Patient reports that the exercises have been going ok when she can do them. She currently has a kidney stone so have been in  excruciating pain. She states left shoulder hurts but not every day, it's not too bad right now  EVAL: Pt presents to PT with reports of chronic bilateral UE and hand weakness after multiple seizures and hospitalization last year. Her walking has improved but she notes continued decrease in her activity tolerance. 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 Does continue to have significant L shoulder pain and discomfort which has been present from first PT episode last year.    Hand dominance: Right  PERTINENT HISTORY: Seizures, HTN, Cancer  PAIN:  Are you having pain?  Yes: NPRS scale: 0/10 Worst: 8/10 Pain location: left shoulder Pain description: sharp, tight Aggravating factors: none Relieving factors: salonpas patches, heat  PRECAUTIONS: None  RED FLAGS: None   WEIGHT BEARING RESTRICTIONS: No  FALLS:  Has patient fallen in last 6 months?  No  LIVING ENVIRONMENT: Lives with: lives with their family Lives in: House/apartment Stairs: No Has following equipment at home: Environmental consultant - 4 wheeled, bed side commode, and Grab bars  OCCUPATION: CNA - [employer]  PLOF: Independent  PATIENT GOALS: improve her UE strength and get back/improve her functional activity tolerance  NEXT MD VISIT: 10/02/2023   OBJECTIVE:  Note: Objective measures were completed at Evaluation unless otherwise noted. PATIENT SURVEYS :  FOTO: 45% function; 58% predicted     SENSATION: Light touch: Impaired - L UE (hx of cervical radiculopathy)  POSTURE: Rounded shoulders, fwd head  UPPER EXTREMITY ROM:   Active ROM Right eval Left eval  Shoulder flexion [facility] 115  Shoulder extension    Shoulder abduction    Shoulder adduction    Shoulder internal rotation    Shoulder external rotation    Elbow flexion    Elbow extension    Wrist flexion    Wrist extension    Wrist ulnar deviation    Wrist radial deviation    Wrist pronation    Wrist supination    (Blank rows = not tested)  UPPER EXTREMITY MMT:  MMT Right eval Left eval  Shoulder flexion 4/5 3+/5  Shoulder extension    Shoulder abduction 4/5 3+/5  Shoulder adduction    Shoulder internal rotation 4/5 3+/5  Shoulder external rotation 4/5 3+/5  Middle trapezius    Lower trapezius    Elbow flexion 4/5 3+/5  Elbow extension 4/5 3+/5  Wrist flexion    Wrist extension    Wrist ulnar deviation    Wrist radial deviation    Wrist pronation    Wrist supination    Grip strength (lbs) 60 45  (Blank rows = not tested)  SHOULDER SPECIAL TESTS: Impingement tests: Painful arc test: positive - left SLAP lesions: DNT Instability tests: DNT Rotator cuff assessment: DNT Biceps assessment: DNT  JOINT MOBILITY TESTING:  DNT  PALPATION:  TTP to bilateral upper trap, L infraspinatus    TREATENT: [facility] Adult PT Treatment:                                                DATE:  09/21/2023 Therapeutic Exercise: UBE L1 x 4 min (fwd/bwd) while taking subjective Row with green 2 x 15 Shoulder extension with green 2 x 10 Standing wall ball walk with physioball for shoulder elevation x 10 Supine shoulder flexion  holding physionall 2 x 10 Supine horizontal abduction with red 2 x 10 Seated double ER and scap retraction with red 2 x 10 Standing tricep push down with red 3 x 10 Standing bicep curls with 3# 3 x 10 Seated shoulder scaption with 1# 2 x 10 Seated shoulder abduction palm down with 1# 2 x 10   [facility] Adult PT Treatment:                                                DATE: 09/08/2023 Therapeutic Exercise: Putty squeeze x 60" each Seated scapular retraction x 5 Row x 5 GTB Shoulder ext x 5 GTB Seated bilateral ER RTB x 5 Seated horizontal abd x 5 RTB Seated bicep curls to fatigue 2#  PATIENT EDUCATION: Education details: HEP update Person educated: Patient Education method: Explanation, Demonstration, Handout Education comprehension: verbalized understanding and returned demonstration  HOME EXERCISE PROGRAM: Access Code: [email or web address] URL: [email or web address] Date: 09/08/2023 Prepared by: Edwinna Areola  Exercises - Putty Squeezes  - 1-2 x daily - 7 x weekly - 2 min hold - Seated Scapular Retraction  - 1-2 x daily - 7 x weekly - 2 sets - 10 reps - Scapular Retraction with Resistance  - 1-2 x daily - 7 x weekly - 2-3 sets - 10 reps - green band hold - Shoulder extension with resistance - Neutral  - 1-2 x daily - 7 x weekly - 2-3 sets - 10 reps - green band hold - Shoulder External Rotation and Scapular Retraction with Resistance  - 1-2 x daily - 7 x weekly - 2-3 sets - 10 reps - red band hold - Standing Shoulder Horizontal Abduction with Resistance  - 1-2 x daily - 7 x weekly - 2-3 sets - 10 reps - red band hold - Seated Bicep Curls Supinated with Dumbbells  - 1-2 x daily - 7 x weekly - 1-2 sets - to fatigue hold   ASSESSMENT: CLINICAL  IMPRESSION: Patient tolerated therapy well with no adverse effects. She reports missing last few visits due to kidney stones. Therapy focused on progression of strengthening for bilateral shoulder and UE with good tolerance. She was able to complete all prescribed exercises without any increase in left shoulder pain, but did report generalized fatigue with exercises. She does require occasional cueing for proper posture with exercises. Updated her HEP to progress shoulder strengthening for home. Patient would benefit from continued skilled PT to progress her mobility and strength in order to reduce pain and maximize functional ability.    EVAL: Patient is a 48 y.o. F who was seen today for physical therapy evaluation and treatment for bilateral UE weakness and decreased functional ability. Physical findings are consistent with timeline and MD impression as pt demonstrates decrease in UE strength and general functional ability. FOTO score demonstrates decrease in subjective functional ability below PLOF. 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   OBJECTIVE IMPAIRMENTS: Abnormal gait, decreased activity tolerance, decreased endurance, decreased mobility, difficulty walking, decreased ROM, decreased strength, impaired UE functional use, postural dysfunction, and pain   ACTIVITY LIMITATIONS: carrying, lifting, transfers, reach over head, hygiene/grooming, and caring for others  PARTICIPATION LIMITATIONS: meal prep, cleaning, laundry, driving, shopping, community activity, occupation, and yard work  PERSONAL FACTORS: Time since onset of injury/illness/exacerbation and 3+  comorbidities: Seizures, HTN, Cancer  are also affecting patient's functional outcome.    GOALS: Goals reviewed with patient? No  SHORT TERM GOALS: Target date: 09/29/2023   Pt will be compliant and knowledgeable with initial HEP for improved comfort and  carryover Baseline: initial HEP given  Goal status: INITIAL  2.  Pt will self report left shoulder pain no greater than 5/10 for improved comfort and functional ability Baseline: 8/10 at worst Goal status: INITIAL   LONG TERM GOALS: Target date: 11/03/2023   Pt will improve FOTO function score to no less than 58% as proxy for functional improvement for improved functional ability with work and home ADLs Baseline: 45% function Goal status: INITIAL   2.  Pt will self report left shoulder pain no greater than 1-2/10 for improved comfort and functional ability Baseline: 8/10 at worst Goal status: INITIAL   3.  Pt will improve bilateral UE MMT to no less than 4+/5 for improved comfort and return to work ability as CNA Baseline: see MMT chart Goal status: INITIAL  4.  Pt will improve bilateral grip strength to no less than 75lbs for return to age-related normative value and improved functional ability Baseline: see strength chart Goal status: INITIAL  5.  Pt will be able to perform >30 minutes of standing activity without need for rest break in order to improve functional activity tolerance towards return to work Baseline: 10 minutes Goal status: INITIAL   PLAN: PT FREQUENCY: 2x/week  PT DURATION: 8 weeks  PLANNED INTERVENTIONS: 97164- PT Re-evaluation, 97110-Therapeutic exercises, 97530- Therapeutic activity, 97112- Neuromuscular re-education, 97535- Self Care, 62952- Manual therapy, 97014- Electrical stimulation (unattended), [id number]- Electrical stimulation (manual), 97016- Vasopneumatic device, Dry Needling, Cryotherapy, and Moist heat  PLAN FOR NEXT SESSION: assess HEP response, global UE strengthening    Rosana Hoes, PT, DPT, LAT, ATC 09/21/23  3:00 PM Phone: [phone] Fax: [phone]

## 2023-09-21 NOTE — Patient Instructions (Signed)
Access Code: [email or web address] URL: [email or web address] Date: 09/21/2023 Prepared by: Rosana Hoes  Exercises - Putty Squeezes  - 1-2 x daily - 7 x weekly - 2 min hold - Seated Scapular Retraction  - 1-2 x daily - 7 x weekly - 2 sets - 10 reps - Scapular Retraction with Resistance  - 1-2 x daily - 7 x weekly - 2-3 sets - 10 reps - green band hold - Shoulder extension with resistance - Neutral  - 1-2 x daily - 7 x weekly - 2-3 sets - 10 reps - green band hold - Shoulder External Rotation and Scapular Retraction with Resistance  - 1-2 x daily - 7 x weekly - 2-3 sets - 10 reps - red band hold - Standing Shoulder Horizontal Abduction with Resistance  - 1-2 x daily - 7 x weekly - 2-3 sets - 10 reps - red band hold - Seated Bicep Curls Supinated with Dumbbells  - 1-2 x daily - 7 x weekly - 1-2 sets - to fatigue hold - Standing Shoulder Flexion to 90 Degrees with Dumbbells  - 1-2 x daily - 7 x weekly - 1-2 sets - 10 reps

## 2023-09-22 ENCOUNTER — Telehealth (HOSPITAL_COMMUNITY): Payer: Self-pay | Admitting: Psychiatry

## 2023-09-22 ENCOUNTER — Other Ambulatory Visit: Payer: Self-pay

## 2023-09-22 ENCOUNTER — Encounter: Payer: Self-pay | Admitting: Hematology and Oncology

## 2023-09-22 ENCOUNTER — Encounter (HOSPITAL_COMMUNITY): Payer: Self-pay | Admitting: Psychiatry

## 2023-09-22 ENCOUNTER — Other Ambulatory Visit (HOSPITAL_COMMUNITY): Payer: Self-pay

## 2023-09-22 ENCOUNTER — Telehealth (HOSPITAL_BASED_OUTPATIENT_CLINIC_OR_DEPARTMENT_OTHER): Payer: BC Managed Care – PPO | Admitting: Psychiatry

## 2023-09-22 VITALS — Wt 195.0 lb

## 2023-09-22 DIAGNOSIS — F319 Bipolar disorder, unspecified: Secondary | ICD-10-CM

## 2023-09-22 DIAGNOSIS — F411 Generalized anxiety disorder: Secondary | ICD-10-CM

## 2023-09-22 MED ORDER — VORTIOXETINE HBR 5 MG PO TABS
ORAL_TABLET | ORAL | 0 refills | Status: DC
  Filled 2023-09-22: qty 53, 30d supply, fill #0

## 2023-09-22 MED ORDER — LORAZEPAM 1 MG PO TABS
1.0000 mg | ORAL_TABLET | Freq: Three times a day (TID) | ORAL | 2 refills | Status: DC | PRN
  Filled 2023-09-22 – 2023-10-16 (×2): qty 90, 30d supply, fill #0
  Filled 2023-11-15: qty 90, 30d supply, fill #1

## 2023-09-22 NOTE — Telephone Encounter (Signed)
D:  Dr. Lolly Mustache referred pt to [facility].  A:  Placed call to re-orient pt, but there was no answer.  Left vm for pt to call the case manager back with an available time to do an assessment on 09-25-23.  Inform Dr. Lolly Mustache.

## 2023-09-22 NOTE — Progress Notes (Signed)
[facility] MD Virtual Progress Note   Patient Location: Home Provider Location: Home Office  I connect with patient by video and verified that I am speaking with correct person by using two identifiers. I discussed the limitations of evaluation and management by telemedicine and the availability of in person appointments. I also discussed with the patient that there may be a patient responsible charge related to this service. The patient expressed understanding and agreed to proceed.  Sandra Miller [id number] 48 y.o.  09/22/2023 10:46 AM  History of Present Illness:  Patient was evaluated by video session.  She reported feeling sad, depressed and irritable.  She is lying in the bed.  She reported most of the time she stays in the dog and does not leave the house unless it is doctor's appointment.  She feels sometime hopeless, worthless.  She feels a burden to her family but denies any suicidal thoughts.  Despite taking multiple medication she does not feel enjoyment in her life.  Her Christmas was okay.  She was able to see all her kids and family members.  She reported a lot of anger to herself because she was doing very well until July she had episode and she was admitted to the hospital with a diagnosis of seizures.  Since then her life is change.  She has not back to work for about 6 months.  Recently her neurologist allow her to have short distance driving.  She did go to local grocery store/gas station 3 times.  She reported few panic attacks but they are not as intense.  She is doing physical therapy.  She denies any hallucination or any paranoia.  She reported decreased energy level, decreased motivation.  She has no more seizures since July.  She feels bad because her 45 year old mother takes her to the doctor's appointment.  She admitted irritability, frustration but denies any mania, 5 to 5 years.  Her energy level is low.  Her appetite is fair.  She had a good support from  her family.  She is a neurologist and taking Lamictal.  Recently she was seen in the emergency room for health needs.  She had labs.  She is taking Prozac and Ativan.  She is taking Ativan 3 times a day.  She denies drinking or using any illegal substances.  Past Psychiatric History: H/O bipolar disorder, alcohol dependence and marijuana abuse.  H/O at least 6 inpatient. Tried lithium, Seroquel, Lamictal, Paxil, Celexa, Abilify, Geodon, Klonopin, Depakote, Neurontin and Cymbalta.  No H/o history of suicidal attempt.  Olanzapine helped but it was discontinued in July 2024 due to abnormal EKG.    Outpatient Encounter Medications as of 09/22/2023  Medication Sig   famotidine (PEPCID) 40 MG tablet Take 1 tablet (40 mg total) by mouth daily.   FLUoxetine (PROZAC) 10 MG capsule Take 3 capsules (30 mg total) by mouth daily.   Fremanezumab-vfrm (AJOVY) 225 MG/1.5ML SOAJ Inject 225 mg into the skin every 30 (thirty) days.   gabapentin (NEURONTIN) 300 MG capsule Take 1 capsule (300 mg) by mouth daily.   LamoTRIgine (LAMICTAL XR) 200 MG TB24 24 hour tablet Take 1 tablet (200 mg total) by mouth at bedtime.   letrozole (FEMARA) 2.5 MG tablet Take 1 tablet by mouth daily.   lisinopril (ZESTRIL) 10 MG tablet Take 1 tablet (10 mg total) by mouth daily. Stop the Lisinopril/HCTZ combination   loratadine (CLARITIN REDITABS) 10 MG dissolvable tablet Take 10 mg by mouth daily. OTC  LORazepam (ATIVAN) 1 MG tablet Take 1 tablet (1 mg total) by mouth 3 (three) times daily as needed for anxiety   Magnesium 400 MG TABS Take 800 mg by mouth 2 (two) times daily.   methimazole (TAPAZOLE) 5 MG tablet Take one tablet (5 mg dose) by mouth daily. (Patient taking differently: Take 5 mg by mouth daily.)   Multiple Vitamin (MULTIVITAMIN WITH MINERALS) TABS tablet Take 1 tablet by mouth daily.   omeprazole (PRILOSEC) 40 MG capsule Take 1 capsule (40 mg total) by mouth daily before breakfast.   potassium chloride SA (KLOR-CON M) 20  MEQ tablet Take 1 tablet (20 mEq) by mouth daily.   promethazine (PHENERGAN) 12.5 MG tablet Take 1 tablet (12.5 mg total) by mouth every 8 (eight) hours as needed for nausea or vomiting.   propranolol (INDERAL) 10 MG tablet Take 1 tablet by mouth 2 times daily.   spironolactone (ALDACTONE) 25 MG tablet Take 0.5 tablets (12.5 mg total) by mouth daily.   Ubrogepant (UBRELVY) 50 MG TABS Take 50 mg as needed for migraine, a second dose may be taken at least 2 hours after the initial dose. DO NOT EXCEED 2 doses in 24 hours.   No facility-administered encounter medications on file as of 09/22/2023.    Recent Results (from the past 2160 hours)  Comprehensive metabolic panel     Status: Abnormal   Collection Time: 07/06/23  3:37 PM  Result Value Ref Range   Glucose 97 70 - 99 mg/dL   BUN 15 6 - 24 mg/dL   Creatinine, Ser 1.61 0.57 - 1.00 mg/dL   eGFR 90 >09 UE/AVW/0.98   BUN/Creatinine Ratio 19 9 - 23   Sodium 138 134 - 144 mmol/L   Potassium 4.7 3.5 - 5.2 mmol/L   Chloride 100 96 - 106 mmol/L   CO2 22 20 - 29 mmol/L   Calcium 10.5 (H) 8.7 - 10.2 mg/dL   Total Protein 7.7 6.0 - 8.5 g/dL   Albumin 4.9 3.9 - 4.9 g/dL   Globulin, Total 2.8 1.5 - 4.5 g/dL   Bilirubin Total 0.3 0.0 - 1.2 mg/dL   Alkaline Phosphatase 92 44 - 121 IU/L   AST 17 0 - 40 IU/L   ALT 13 0 - 32 IU/L  TSH     Status: None   Collection Time: 07/06/23  3:37 PM  Result Value Ref Range   TSH 3.180 0.450 - 4.500 uIU/mL  Magnesium     Status: None   Collection Time: 07/06/23  3:37 PM  Result Value Ref Range   Magnesium 1.8 1.6 - 2.3 mg/dL  Urinalysis, Routine w reflex microscopic -Urine, Clean Catch     Status: Abnormal   Collection Time: 08/29/23  4:52 AM  Result Value Ref Range   Color, Urine STRAW (A) YELLOW   APPearance HAZY (A) CLEAR   Specific Gravity, Urine 1.009 1.005 - 1.030   pH 5.0 5.0 - 8.0   Glucose, UA NEGATIVE NEGATIVE mg/dL   Hgb urine dipstick SMALL (A) NEGATIVE   Bilirubin Urine NEGATIVE NEGATIVE    Ketones, ur NEGATIVE NEGATIVE mg/dL   Protein, ur NEGATIVE NEGATIVE mg/dL   Nitrite NEGATIVE NEGATIVE   Leukocytes,Ua NEGATIVE NEGATIVE   RBC / HPF 0-5 0 - 5 RBC/hpf   WBC, UA 0-5 0 - 5 WBC/hpf   Bacteria, UA NONE SEEN NONE SEEN   Squamous Epithelial / HPF 6-10 0 - 5 /HPF   Mucus PRESENT     Comment: Performed at [facility], [address], [place], [place] [address]  CBC with Differential     Status: Abnormal   Collection Time: 08/29/23  6:22 AM  Result Value Ref Range   WBC 7.8 4.0 - 10.5 K/uL   RBC 5.65 (H) 3.87 - 5.11 MIL/uL   Hemoglobin 16.3 (H) 12.0 - 15.0 g/dL   HCT 95.6 (H) 21.3 - 08.6 %   MCV 89.7 80.0 - 100.0 fL   MCH 28.8 26.0 - 34.0 pg   MCHC 32.1 30.0 - 36.0 g/dL   RDW 57.8 46.9 - 62.9 %   Platelets 226 150 - 400 K/uL   nRBC 0.0 0.0 - 0.2 %   Neutrophils Relative % 77 %   Neutro Abs 6.0 1.7 - 7.7 K/uL   Lymphocytes Relative 17 %   Lymphs Abs 1.4 0.7 - 4.0 K/uL   Monocytes Relative 5 %   Monocytes Absolute 0.4 0.1 - 1.0 K/uL   Eosinophils Relative 1 %   Eosinophils Absolute 0.1 0.0 - 0.5 K/uL   Basophils Relative 0 %   Basophils Absolute 0.0 0.0 - 0.1 K/uL   Immature Granulocytes 0 %   Abs Immature Granulocytes 0.03 0.00 - 0.07 K/uL    Comment: Performed at [facility], [address], [place], [place] [address]  Comprehensive metabolic panel     Status: Abnormal   Collection Time: 08/29/23  6:22 AM  Result Value Ref Range   Sodium 135 135 - 145 mmol/L   Potassium 3.6 3.5 - 5.1 mmol/L   Chloride 100 98 - 111 mmol/L   CO2 23 22 - 32 mmol/L   Glucose, Bld 116 (H) 70 - 99 mg/dL    Comment: Glucose reference range applies only to samples taken after fasting for at least 8 hours.   BUN 11 6 - 20 mg/dL   Creatinine, Ser 3.24 (L) 0.44 - 1.00 mg/dL   Calcium 40.1 8.9 - 02.7 mg/dL   Total Protein 8.3 (H) 6.5 - 8.1 g/dL   Albumin 4.5 3.5 - 5.0 g/dL   AST 22 15 - 41 U/L   ALT 15 0 - 44 U/L   Alkaline Phosphatase 71 38 -  126 U/L   Total Bilirubin 1.1 <1.2 mg/dL   GFR, Estimated >25 >36 mL/min    Comment: (NOTE) Calculated using the CKD-EPI Creatinine Equation (2021)    Anion gap 12 5 - 15    Comment: Performed at [facility], [address] [place], [place] [address]  Lipase, blood     Status: None   Collection Time: 08/29/23  6:22 AM  Result Value Ref Range   Lipase 34 11 - 51 U/L    Comment: Performed at [facility], [address], [place], [place] [address]     Psychiatric Specialty Exam: Physical Exam  Review of Systems  Constitutional:  Positive for activity change and fatigue.  Psychiatric/Behavioral:  Positive for decreased concentration and dysphoric mood.     Weight 195 lb (88.5 kg).There is no height or weight on file to calculate BMI.  General Appearance: Fairly Groomed and lying on bed  Eye Contact:  Fair  Speech:  Slow  Volume:  Decreased  Mood:  Anxious, Depressed, Dysphoric, and Hopeless  Affect:  Constricted  Thought Process:  Descriptions of Associations: Intact  Orientation:  Full (Time, Place, and Person)  Thought Content:  Rumination  Suicidal Thoughts:  No  Homicidal Thoughts:  No  Memory:  Immediate;  Good Recent;   Good Remote;   Fair  Judgement:  Intact  Insight:  Present  Psychomotor Activity:  Decreased  Concentration:  Concentration: Fair and Attention Span: Fair  Recall:  Good  Fund of Knowledge:  Good  Language:  Good  Akathisia:  NA  Handed:  Right  AIMS (if indicated):     Assets:  Communication Skills Desire for Improvement Housing Social Support  ADL's:  Intact  Cognition:  WNL  Sleep:  fair     Assessment/Plan: Bipolar 1 disorder (HCC) - Plan: vortioxetine HBr (TRINTELLIX) 5 MG TABS tablet  GAD (generalized anxiety disorder) - Plan: LORazepam (ATIVAN) 1 MG tablet, vortioxetine HBr (TRINTELLIX) 5 MG TABS tablet  I reviewed notes from recent emergency room visit, blood work results, collateral  information from other provider.  She is slowly and gradually decompensating.  Even though neurologist cleared her for driving patient does not feel enjoyment in her life.  She had a lot of ruminative thoughts.  Discussed to consider changing the medication.  She is taking Prozac for a while and I recommend to try Trintellix which she has not tried before.  She agreed with the plan.  We will reduce Prozac to 10 mg for 1 week and start Trintellix 5 mg for 1 week and then 10 mg daily.  For now she will continue Ativan 1 mg 3 times a day.  She is also taking Lamictal prescribed by neurology.  She has no seizures since July.  I also discussed in detail about getting therapy/group therapy.  Patient told since she is not driving and cannot ask her mother to take to the therapy appointment but willing to do virtual therapy.  I explained therapy results of virtual.  We will refer for IOP.  She agreed with the plan.  I discussed medication risks and benefits.  She will try Trintellix.  I will follow up in 4 weeks.  Recommended to call us back if she has any question or any concern.   Follow Up Instructions:     I discussed the assessment and treatment plan with the patient. The patient was provided an opportunity to ask questions and all were answered. The patient agreed with the plan and demonstrated an understanding of the instructions.   The patient was advised to call back or seek an in-person evaluation if the symptoms worsen or if the condition fails to improve as anticipated.    Collaboration of Care: Other provider involved in patient's care AEB notes are available in epic to review  Patient/Guardian was advised Release of Information must be obtained prior to any record release in order to collaborate their care with an outside provider. Patient/Guardian was advised if they have not already done so to contact the registration department to sign all necessary forms in order for [place] to release information  regarding their care.   Consent: Patient/Guardian gives verbal consent for treatment and assignment of benefits for services provided during this visit. Patient/Guardian expressed understanding and agreed to proceed.     I provided 29 minutes of non face to face time during this encounter.  Note: This document was prepared by [employer] voice dictation technology and any errors that results from this process are unintentional.    Cleotis Nipper, MD 09/22/2023

## 2023-09-25 ENCOUNTER — Encounter: Payer: Self-pay | Admitting: Physical Therapy

## 2023-09-25 ENCOUNTER — Other Ambulatory Visit (HOSPITAL_COMMUNITY): Payer: BC Managed Care – PPO | Attending: Psychiatry | Admitting: Psychiatry

## 2023-09-25 ENCOUNTER — Other Ambulatory Visit: Payer: Self-pay

## 2023-09-25 ENCOUNTER — Ambulatory Visit: Payer: BC Managed Care – PPO | Admitting: Physical Therapy

## 2023-09-25 DIAGNOSIS — Z1330 Encounter for screening examination for mental health and behavioral disorders, unspecified: Secondary | ICD-10-CM

## 2023-09-25 DIAGNOSIS — M6281 Muscle weakness (generalized): Secondary | ICD-10-CM | POA: Diagnosis not present

## 2023-09-25 DIAGNOSIS — F1011 Alcohol abuse, in remission: Secondary | ICD-10-CM | POA: Insufficient documentation

## 2023-09-25 DIAGNOSIS — M25512 Pain in left shoulder: Secondary | ICD-10-CM

## 2023-09-25 DIAGNOSIS — Z853 Personal history of malignant neoplasm of breast: Secondary | ICD-10-CM | POA: Insufficient documentation

## 2023-09-25 DIAGNOSIS — Z79899 Other long term (current) drug therapy: Secondary | ICD-10-CM | POA: Insufficient documentation

## 2023-09-25 DIAGNOSIS — F411 Generalized anxiety disorder: Secondary | ICD-10-CM | POA: Insufficient documentation

## 2023-09-25 DIAGNOSIS — E0500 Thyrotoxicosis with diffuse goiter without thyrotoxic crisis or storm: Secondary | ICD-10-CM | POA: Insufficient documentation

## 2023-09-25 DIAGNOSIS — Z8544 Personal history of malignant neoplasm of other female genital organs: Secondary | ICD-10-CM | POA: Insufficient documentation

## 2023-09-25 DIAGNOSIS — F319 Bipolar disorder, unspecified: Secondary | ICD-10-CM | POA: Insufficient documentation

## 2023-09-25 NOTE — Therapy (Signed)
OUTPATIENT PHYSICAL THERAPY TREATMENT   Patient Name: Sandra Miller MRN: [id number] DOB:16-Sep-1975, 48 y.o., female Today's Date: 09/25/2023   END OF SESSION:  PT End of Session - 09/25/23 1312     Visit Number 3    Number of Visits 17    Date for PT Re-Evaluation 11/03/23    Authorization Type BC[employer] / MCD    Authorization Time Period 09/13/2023 - 09/26/2023    Authorization - Visit Number 2    Authorization - Number of Visits 3    PT Start Time 1315    PT Stop Time 1400    PT Time Calculation (min) 45 min    Activity Tolerance Patient tolerated treatment well    Behavior During Therapy WFL for tasks assessed/performed               Past Medical History:  Diagnosis Date   Arthritis    Bipolar 1 disorder (HCC)    Cancer (HCC)    vulva, and breast   Complication of anesthesia    wakes up during procedures   Depression    GAD (generalized anxiety disorder)    Genital HSV    currently per pt  no break out 03-22-2016    GERD (gastroesophageal reflux disease)    Graves disease    Hiatal hernia    History of cervical dysplasia    2012 laser ablation   History of esophageal dilatation    for dysphasia -- x2 dilated   History of gastric ulcer    History of Helicobacter pylori infection    remote hx   History of hidradenitis suppurativa    "gets all over body intermittantly"     History of hypertension    no issue since stopped drinking alcohol 2014   History of kidney stones    History of panic attacks    History of radiation therapy 03/16/2020-05/08/2020   vulva  Dr Antony Blackbird   History of radiation therapy 09/09/2020-10/23/2020   left chest wall/left SCV   Dr Antony Blackbird   Hypertension    Iron deficiency anemia    Left ureteral stone    OCD (obsessive compulsive disorder)    PONV (postoperative nausea and vomiting)    Pre-diabetes    PTSD (post-traumatic stress disorder)    Recovering alcoholic in remission (HCC)    since 2014   RLS (restless legs  syndrome)    Smokers' cough (HCC)    Thyroid disease    Urgency of urination    Yeast infection involving the vagina and surrounding area    secondary to taking antibiotic   Past Surgical History:  Procedure Laterality Date   CESAREAN SECTION  1995   w/  Bilateral Tubal Ligation   COLONOSCOPY  last one 08-09-2013   CYSTOSCOPY W/ URETERAL STENT PLACEMENT Left 03/29/2016   Procedure: CYSTOSCOPY WITH STENT REPLACEMENT;  Surgeon: Hildred Laser, MD;  Location: [facility];  Service: Urology;  Laterality: Left;   CYSTOSCOPY WITH RETROGRADE PYELOGRAM, URETEROSCOPY AND STENT PLACEMENT Left 03/08/2016   Procedure: CYSTOSCOPY WITH  LEFT RETROGRADE PYELOGRAM, AND STENT PLACEMENT;  Surgeon: Hildred Laser, MD;  Location: [facility];  Service: Urology;  Laterality: Left;   CYSTOSCOPY/RETROGRADE/URETEROSCOPY/STONE EXTRACTION WITH BASKET Left 03/29/2016   Procedure: CYSTOSCOPY/RETROGRADE/URETEROSCOPY/STONE EXTRACTION WITH BASKET;  Surgeon: Hildred Laser, MD;  Location: [facility];  Service: Urology;  Laterality: Left;   ENDOMETRIAL ABLATION W/ NOVASURE  04-01-2010   ESOPHAGOGASTRODUODENOSCOPY  last one 08-09-2013   KNEE  ARTHROSCOPY Left as teen   LASER ABLATION OF THE CERVIX  2012 approx   MASTECTOMY WITH AXILLARY LYMPH NODE DISSECTION Left 07/27/2020   Procedure: LEFT MASTECTOMY WITH LEFT RADIOACTIVE SEED GUIDED TARGETED AXILLARY LYMPH NODE DISSECTION;  Surgeon: Ovidio Kin, MD;  [facility];  Service: General;  Laterality: Left;   PORT-A-CATH REMOVAL Right 05/18/2021   Procedure: REMOVAL PORT-A-CATH;  Surgeon: Manus Rudd, MD;  Location: [facility];  Service: General;  Laterality: Right;   PORTACATH PLACEMENT Right 03/06/2020   Procedure: INSERTION PORT-A-CATH WITH ULTRASOUND GUIDANCE;  Surgeon: Ovidio Kin, MD;  Location: [facility];  Service: General;  Laterality: Right;   ROBOTIC ASSISTED TOTAL HYSTERECTOMY WITH BILATERAL SALPINGO OOPHERECTOMY  N/A 05/18/2021   Procedure: XI ROBOTIC ASSISTED TOTAL HYSTERECTOMY WITH BILATERAL SALPINGO OOPHORECTOMY;  Surgeon: Adolphus Birchwood, MD;  Location: [facility];  Service: Gynecology;  Laterality: N/A;   TRANSTHORACIC ECHOCARDIOGRAM  05-19-2006   lvsf normal, ef 55-65%, there was mild flattening of the interventricular septum during diastoli/  RV size at upper limits normal   TUBAL LIGATION     VULVA /PERINEUM BIOPSY N/A 05/18/2021   Procedure: VULVAR BIOPSY;  Surgeon: Adolphus Birchwood, MD;  Location: [facility];  Service: Gynecology;  Laterality: N/A;   WISDOM TOOTH EXTRACTION  age 19 's   Patient Active Problem List   Diagnosis Date Noted   Chronic migraine without aura, with intractable migraine, so stated, with status migrainosus 08/22/2023   Mood disorder (HCC) 05/29/2023   Fatigue 05/15/2023   Prolonged QT interval 04/10/2023   Dizziness 04/10/2023   Provoked seizures (HCC) 03/31/2023   Respiratory depression 03/31/2023   Status epilepticus (HCC) 03/31/2023   Migraine with aura and without status migrainosus, not intractable 10/03/2022   Pain in right knee 09/07/2022   Bilateral primary osteoarthritis of knee 09/07/2022   Chronic migraine without aura without status migrainosus, not intractable 07/07/2022   Graves disease 05/10/2022   Sebaceous cyst 12/06/2021   Pain in left knee 11/03/2021   Hyperthyroidism 08/13/2021   Bipolar 1 disorder (HCC) 05/07/2021   Obesity (BMI 30.0-34.9) 05/07/2021   Hyperlipidemia 08/12/2020   Prediabetes 08/12/2020   Hypomagnesemia 08/12/2020   Mutation in BRIP1 gene 06/24/2020   Hypokalemia 06/17/2020   Dehydration    Colitis 06/16/2020   Port-A-Cath in place 03/11/2020   Malignant neoplasm of upper-inner quadrant of left breast in female, estrogen receptor positive (HCC) 02/25/2020   Vulvar cancer (HCC) 01/29/2020   Vulvar ulceration 01/23/2020   Essential hypertension 10/31/2019   Nausea and vomiting 12/27/2018   Seasonal allergic rhinitis 12/27/2018    History of ELISA positive for HSV 10/01/2018   Restless leg syndrome 04/01/2016   Nephrolithiasis 03/08/2016   Obstructive pyelonephritis 03/08/2016   Normocytic anemia 03/08/2016   Hidradenitis suppurativa of left axilla 11/03/2015   Loss of weight 09/08/2015   Neck pain on left side 10/22/2014   Genital warts 05/20/2014   GERD (gastroesophageal reflux disease) 12/26/2012   Functional dyspepsia 12/26/2012   Tobacco abuse 08/22/2012   Herpes simplex virus (HSV) infection 02/03/2007    PCP: Marcine Matar, MD  REFERRING PROVIDER: Marcine Matar, MD  REFERRING DIAG: R29.898 (ICD-10-CM) - Weakness of both hands   THERAPY DIAG:  Muscle weakness (generalized)  Left shoulder pain, unspecified chronicity  Rationale for Evaluation and Treatment: Rehabilitation  ONSET DATE: Chronic   SUBJECTIVE SUBJECTIVE STATEMENT: Patient reports that she is doing alright. She gets exhausted following exercise.  EVAL: Pt presents to PT with reports of chronic bilateral  UE and hand weakness after multiple seizures and hospitalization last year. Her walking has improved but she notes continued decrease in her activity tolerance. 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 Does continue to have significant L shoulder pain and discomfort which has been present from first PT episode last year.    Hand dominance: Right  PERTINENT HISTORY: Seizures, HTN, Cancer  PAIN:  Are you having pain?  Yes: NPRS scale: 3/10 Worst: 8/10 Pain location: left shoulder Pain description: sharp, tight Aggravating factors: none Relieving factors: salonpas patches, heat  PRECAUTIONS: None  RED FLAGS: None   WEIGHT BEARING RESTRICTIONS: No  FALLS:  Has patient fallen in last 6 months? No  LIVING ENVIRONMENT: Lives with: lives with their family Lives in: House/apartment Stairs: No Has following equipment at home:  Environmental consultant - 4 wheeled, bed side commode, and Grab bars  OCCUPATION: CNA - [employer]  PLOF: Independent  PATIENT GOALS: improve her UE strength and get back/improve her functional activity tolerance  NEXT MD VISIT: 10/02/2023   OBJECTIVE:  Note: Objective measures were completed at Evaluation unless otherwise noted. PATIENT SURVEYS :  FOTO: 45% function; 58% predicted   09/25/2023: 51%    SENSATION: Light touch: Impaired - L UE (hx of cervical radiculopathy)  POSTURE: Rounded shoulders, fwd head  UPPER EXTREMITY ROM:   Active ROM Right eval Left eval Left 09/25/2023  Shoulder flexion WFL 115 130  Shoulder extension     Shoulder abduction     Shoulder adduction     Shoulder internal rotation     Shoulder external rotation     Elbow flexion     Elbow extension     Wrist flexion     Wrist extension     Wrist ulnar deviation     Wrist radial deviation     Wrist pronation     Wrist supination     (Blank rows = not tested)  UPPER EXTREMITY MMT:  MMT Right eval Left eval Rt / Lt 09/25/2023  Shoulder flexion 4/5 3+/5 4 / 4-  Shoulder extension     Shoulder abduction 4/5 3+/5 4 / 4-  Shoulder adduction     Shoulder internal rotation 4/5 3+/5   Shoulder external rotation 4/5 3+/5 4 / 4-  Middle trapezius     Lower trapezius     Elbow flexion 4/5 3+/5 4 / 4-  Elbow extension 4/5 3+/5 4 / 4-  Wrist flexion     Wrist extension     Wrist ulnar deviation     Wrist radial deviation     Wrist pronation     Wrist supination     Grip strength (lbs) 60 45 58 / 40  (Blank rows = not tested)  SHOULDER SPECIAL TESTS: Impingement tests: Painful arc test: positive - left SLAP lesions: DNT Instability tests: DNT Rotator cuff assessment: DNT Biceps assessment: DNT  JOINT MOBILITY TESTING:  DNT  PALPATION:  TTP to bilateral upper trap, L infraspinatus    TREATENT: [facility] Adult PT Treatment:                                                DATE:  09/25/2023 Therapeutic Exercise: UBE L1 x 4 min (fwd/bwd) while taking subjective Supine horizontal abduction  with red 2 x 10 Supine diagonals with red 2 x 10 each Row with blue 2 x 15 Shoulder extension with green 2 x 10 ER/IR with red 2 x 10 each Standing tricep push down with FM 10# 2 x 15 Standing bicep curls with 3# 2 x 15 Standing shoulder scaption with 1# 2 x 10 Standing shoulder abduction palm down with 1# 2 x 10 Unilateral farmer's carry with 15# 2 x 60 ft each Therapeutic Activity: Reassessment of objective measures including MMT, ROM, FOTO, to address goals and submit for further insurance authorization   [facility] Adult PT Treatment:                                                DATE: 09/21/2023 Therapeutic Exercise: UBE L1 x 4 min (fwd/bwd) while taking subjective Row with green 2 x 15 Shoulder extension with green 2 x 10 Standing wall ball walk with physioball for shoulder elevation x 10 Supine shoulder flexion holding physionall 2 x 10 Supine horizontal abduction with red 2 x 10 Seated double ER and scap retraction with red 2 x 10 Standing tricep push down with red 3 x 10 Standing bicep curls with 3# 3 x 10 Seated shoulder scaption with 1# 2 x 10 Seated shoulder abduction palm down with 1# 2 x 10  [facility] Adult PT Treatment:                                                DATE: 09/08/2023 Therapeutic Exercise: Putty squeeze x 60" each Seated scapular retraction x 5 Row x 5 GTB Shoulder ext x 5 GTB Seated bilateral ER RTB x 5 Seated horizontal abd x 5 RTB Seated bicep curls to fatigue 2#  PATIENT EDUCATION: Education details: HEP Person educated: Patient Education method: Programmer, multimedia, Demonstration Education comprehension: verbalized understanding and returned demonstration  HOME EXERCISE PROGRAM: Access Code: [email or web address]   ASSESSMENT: CLINICAL IMPRESSION: Patient tolerated therapy well with no adverse effects. Patient demonstrates improvement in left shoulder  active elevation and left UE strength but does exhibit a slight decrease in her grip strength. She reports an improvement in her functional status on FOTO. She is progressing with her strengthening exercises in therapy and has been able to progress with her exercise program at home. She has been limited with her attendance in therapy due to pain from kidney stones where she has had to cancel a few visits. Overall she seems to be improving and patient would benefit from continued skilled PT to progress her mobility and strength in order to reduce pain and maximize functional ability.    EVAL: Patient is a 48 y.o. F who was seen today for physical therapy evaluation and treatment for bilateral UE weakness and decreased functional ability. Physical findings are consistent with timeline and MD impression as pt demonstrates decrease in UE strength and general functional ability. FOTO score demonstrates decrease in subjective functional ability below PLOF. 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   OBJECTIVE IMPAIRMENTS: Abnormal gait, decreased activity tolerance, decreased endurance, decreased mobility, difficulty walking, decreased ROM, decreased strength, impaired UE functional use, postural dysfunction, and pain   ACTIVITY LIMITATIONS: carrying,  lifting, transfers, reach over head, hygiene/grooming, and caring for others  PARTICIPATION LIMITATIONS: meal prep, cleaning, laundry, driving, shopping, community activity, occupation, and yard work  PERSONAL FACTORS: Time since onset of injury/illness/exacerbation and 3+ comorbidities: Seizures, HTN, Cancer  are also affecting patient's functional outcome.    GOALS: Goals reviewed with patient? No  SHORT TERM GOALS: Target date: 09/29/2023   Pt will be compliant and knowledgeable with initial HEP for improved comfort and carryover Baseline: initial HEP given  09/25/2023:  progressing Goal status: ONGOING  2.  Pt will self report left shoulder pain no greater than 5/10 for improved comfort and functional ability Baseline: 8/10 at worst 09/25/2023: 8/10 at worst Goal status: ONGOING  LONG TERM GOALS: Target date: 11/03/2023   Pt will improve FOTO function score to no less than 58% as proxy for functional improvement for improved functional ability with work and home ADLs Baseline: 45% function 09/25/2023: 51% Goal status: ONGOING  2.  Pt will self report left shoulder pain no greater than 1-2/10 for improved comfort and functional ability Baseline: 8/10 at worst 09/25/2023: 8/10 at worst Goal status: ONGOING  3.  Pt will improve bilateral UE MMT to no less than 4+/5 for improved comfort and return to work ability as CNA Baseline: see MMT chart 09/25/2023: left UE strength grossly 4-/5 MMT, right UE strength grossly 4/5 MMT Goal status: ONGOING  4.  Pt will improve bilateral grip strength to no less than 75lbs for return to age-related normative value and improved functional ability Baseline: see strength chart 09/25/2023: right 58 lbs, left 40 lbs Goal status: ONGOING  5.  Pt will be able to perform >30 minutes of standing activity without need for rest break in order to improve functional activity tolerance towards return to work Baseline: 10 minutes 09/25/2023:  Goal status: ONGOING   PLAN: PT FREQUENCY: 2x/week  PT DURATION: 8 weeks  PLANNED INTERVENTIONS: 97164- PT Re-evaluation, 97110-Therapeutic exercises, 97530- Therapeutic activity, 97112- Neuromuscular re-education, 97535- Self Care, 16109- Manual therapy, 97014- Electrical stimulation (unattended), [id number]- Electrical stimulation (manual), 97016- Vasopneumatic device, Dry Needling, Cryotherapy, and Moist heat  PLAN FOR NEXT SESSION: assess HEP response, global UE strengthening    Rosana Hoes, PT, DPT, LAT, ATC 09/25/23  2:48 PM Phone: [phone] Fax: [phone]
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
## 2023-09-26 ENCOUNTER — Encounter: Payer: Self-pay | Admitting: Hematology and Oncology

## 2023-09-26 ENCOUNTER — Other Ambulatory Visit (HOSPITAL_COMMUNITY): Payer: BC Managed Care – PPO

## 2023-09-26 ENCOUNTER — Telehealth (HOSPITAL_COMMUNITY): Payer: Self-pay | Admitting: Psychiatry

## 2023-09-26 NOTE — Telephone Encounter (Signed)
D:  Pt was scheduled to start virtual MH-IOP this morning, but she reached out to the case manager c/o of side effects from a new medication (5mg  of Trintellix) she received in the mail yesterday.  Pt c/o nausea, vomiting, and diarrhea.  A:  Rescheduled pt to start MH-IOP tomorrow.  Informed Dr. Lolly Mustache.  R:  Pt receptive.

## 2023-09-27 ENCOUNTER — Telehealth (HOSPITAL_COMMUNITY): Payer: Self-pay

## 2023-09-27 ENCOUNTER — Other Ambulatory Visit (HOSPITAL_COMMUNITY): Payer: BC Managed Care – PPO

## 2023-09-27 ENCOUNTER — Telehealth (HOSPITAL_COMMUNITY): Payer: Self-pay | Admitting: Psychiatry

## 2023-09-27 ENCOUNTER — Ambulatory Visit: Payer: BC Managed Care – PPO

## 2023-09-27 NOTE — Telephone Encounter (Signed)
Called patient about the side effects of her Trintellix. Patient states she is having Nausea and diarrhea. She started the medication on Monday and the symptoms started not long after. Please review and advise, thank you

## 2023-09-27 NOTE — Telephone Encounter (Signed)
Called Patient to discontinued and restart Prozac.

## 2023-09-27 NOTE — Telephone Encounter (Signed)
I called the patient and advised her to stop the Trintellix and restart her Prozac. Patient has Prozac on hand and will call me if she needs a refill before her appointment

## 2023-09-27 NOTE — Telephone Encounter (Signed)
D:  Pt called and left vm that she continues to be ill from the new medication she recently started.  "This Trintellix has me so sick on my stomach.  I'm trying to continue taking it but it makes me so sick."  Pt is requesting to wait and start virtual MH-IOP on 10-02-23.  A:  Informed Dr. Lolly Mustache.

## 2023-09-28 ENCOUNTER — Other Ambulatory Visit (HOSPITAL_COMMUNITY): Payer: BC Managed Care – PPO

## 2023-10-02 ENCOUNTER — Encounter (HOSPITAL_COMMUNITY): Payer: Self-pay

## 2023-10-02 ENCOUNTER — Telehealth: Payer: Self-pay | Admitting: Internal Medicine

## 2023-10-02 ENCOUNTER — Other Ambulatory Visit: Payer: Self-pay

## 2023-10-02 ENCOUNTER — Ambulatory Visit: Payer: BC Managed Care – PPO | Attending: Internal Medicine | Admitting: Internal Medicine

## 2023-10-02 ENCOUNTER — Other Ambulatory Visit (HOSPITAL_COMMUNITY): Payer: Self-pay

## 2023-10-02 ENCOUNTER — Other Ambulatory Visit (HOSPITAL_COMMUNITY): Payer: BC Managed Care – PPO | Admitting: Psychiatry

## 2023-10-02 VITALS — BP 145/83 | HR 99 | Temp 98.2°F | Ht 66.0 in | Wt 205.0 lb

## 2023-10-02 DIAGNOSIS — Z23 Encounter for immunization: Secondary | ICD-10-CM

## 2023-10-02 DIAGNOSIS — R569 Unspecified convulsions: Secondary | ICD-10-CM

## 2023-10-02 DIAGNOSIS — F332 Major depressive disorder, recurrent severe without psychotic features: Secondary | ICD-10-CM

## 2023-10-02 DIAGNOSIS — F411 Generalized anxiety disorder: Secondary | ICD-10-CM

## 2023-10-02 DIAGNOSIS — E0590 Thyrotoxicosis, unspecified without thyrotoxic crisis or storm: Secondary | ICD-10-CM | POA: Diagnosis not present

## 2023-10-02 DIAGNOSIS — R29898 Other symptoms and signs involving the musculoskeletal system: Secondary | ICD-10-CM | POA: Diagnosis not present

## 2023-10-02 DIAGNOSIS — F1011 Alcohol abuse, in remission: Secondary | ICD-10-CM | POA: Diagnosis present

## 2023-10-02 DIAGNOSIS — I10 Essential (primary) hypertension: Secondary | ICD-10-CM | POA: Diagnosis not present

## 2023-10-02 DIAGNOSIS — Z79899 Other long term (current) drug therapy: Secondary | ICD-10-CM | POA: Diagnosis not present

## 2023-10-02 DIAGNOSIS — F319 Bipolar disorder, unspecified: Secondary | ICD-10-CM

## 2023-10-02 DIAGNOSIS — Z8544 Personal history of malignant neoplasm of other female genital organs: Secondary | ICD-10-CM | POA: Diagnosis not present

## 2023-10-02 DIAGNOSIS — N200 Calculus of kidney: Secondary | ICD-10-CM

## 2023-10-02 DIAGNOSIS — E876 Hypokalemia: Secondary | ICD-10-CM

## 2023-10-02 DIAGNOSIS — Z853 Personal history of malignant neoplasm of breast: Secondary | ICD-10-CM | POA: Diagnosis not present

## 2023-10-02 DIAGNOSIS — E0500 Thyrotoxicosis with diffuse goiter without thyrotoxic crisis or storm: Secondary | ICD-10-CM | POA: Diagnosis not present

## 2023-10-02 MED ORDER — SERTRALINE HCL 50 MG PO TABS
ORAL_TABLET | ORAL | 0 refills | Status: DC
  Filled 2023-10-02: qty 27, 30d supply, fill #0

## 2023-10-02 MED ORDER — SPIRONOLACTONE 25 MG PO TABS
12.5000 mg | ORAL_TABLET | Freq: Every day | ORAL | 1 refills | Status: DC
  Filled 2023-10-02: qty 45, 90d supply, fill #0
  Filled 2023-10-19: qty 15, 30d supply, fill #0
  Filled 2023-11-18: qty 15, 30d supply, fill #1
  Filled 2023-12-16: qty 15, 30d supply, fill #2
  Filled 2024-01-16 – 2024-01-19 (×2): qty 15, 30d supply, fill #3
  Filled 2024-02-15: qty 15, 30d supply, fill #4
  Filled 2024-03-14: qty 15, 30d supply, fill #5
  Filled 2024-04-13: qty 15, 30d supply, fill #6
  Filled 2024-05-11: qty 15, 30d supply, fill #7

## 2023-10-02 MED ORDER — FLUOXETINE HCL 10 MG PO CAPS: 10.0000 mg | ORAL_CAPSULE | Freq: Every day | ORAL | Status: DC

## 2023-10-02 MED ORDER — LISINOPRIL 10 MG PO TABS
10.0000 mg | ORAL_TABLET | Freq: Every day | ORAL | 1 refills | Status: DC
  Filled 2023-10-02 – 2023-12-07 (×2): qty 90, 90d supply, fill #0
  Filled 2024-03-06: qty 90, 90d supply, fill #1

## 2023-10-02 MED ORDER — PROPRANOLOL HCL 10 MG PO TABS
10.0000 mg | ORAL_TABLET | Freq: Two times a day (BID) | ORAL | 1 refills | Status: DC
  Filled 2023-10-02 – 2023-10-08 (×2): qty 180, 90d supply, fill #0
  Filled 2024-01-06: qty 180, 90d supply, fill #1

## 2023-10-02 NOTE — Telephone Encounter (Signed)
Please fax my most recent note to [facility]. Attention Verlon Au. C [phone].

## 2023-10-02 NOTE — Progress Notes (Unsigned)
Psychiatric Initial IOP Assessment  Patient Identification: Sandra Miller MRN:  [id number] Date of Evaluation:  10/02/2023 Referral Source: Kathi Ludwig Arfeen  Assessment:  Sandra Miller is a 48 y.o. female with a history of generalized anxiety disorder, bipolar disorder type 1, alcohol use disorder in sustained remission, grave's disease, vulvar and breast cancer 2021 who presents to [facility] via video conferencing for initial evaluation of worsening depression.  Patient reports primarily neurovegetative symptoms that have been secondary to multiple medical problems and self-isolation. She sees Dr. Lolly Mustache outpatient and had been recently trialed on Trintellix but did not tolerate it due to severe nausea.  Patient was restarted on fluoxetine last visit with outpatient provider.  Given limited benefit from Prozac, we plan to switch patient to sertraline.  She wanted to cross taper as opposed to direct switch.  She is on a fairly high dose of lorazepam daily so we also discussed risk for dependence which she is aware of and will discuss with outpatient provider how to safely taper off as able.  She is on a mood stabilizer lamotrigine for her bipolar diagnosis.  It is unclear at this time whether patient's diagnosis of bipolar disorder is accurate given lack of discrete manic episodes as compared to poor impulse control.   Plan:  # Bipolar disorder, currently depressed Past medication trials:  Status of problem: Active Interventions: -- Continue lamotrigine 200 mg daily --Start sertraline 25 mg for 6 days then increase to 50 mg -- Decrease prozac to 10 mg for 6 days then stop  # Generalized anxiety disorder Past medication trials:  Interventions: -- Continue lorazepam 1 mg 3 times daily  -advise taper outpatient  Patient was given contact information for behavioral health clinic and was instructed to call 911 for emergencies.     Subjective:  Chief Complaint:  Medication Management  History of Present Illness:   Patient presents to intensive outpatient due to ongoing severe depressive symptoms as well as worsening anxiety.  She reports having a provoked seizure approximately 6 months ago and this led to significant anxiety which she would have another seizure when she resumes driving.  She did drive 1 time last week which led to significant anxiety.  Seizure was induced by severely low magnesium and hypokalemia due to persistent vomiting.  She also reports that she has been more self isolative as well as depressed.  She reports poor sleep, increased appetite, anhedonia, low energy, poor concentration, and increased guilt. She also endorses passive suicidal ideation but denies any active intent or plan.  She endorses history of mania stating that she would spend money that she does not have, racing thoughts, and euphoria.  She reports she had been previously hospitalized multiple times voluntarily due to mania.  She denies ever experiencing psychosis.  She does endorse history of anxiety and will have regular panic attacks which is when outpatient psychiatrist started lorazepam which has since increased to 3 times daily.  She reports having a history of severe alcohol use disorder that has been in sustained remission for the past 13 years.  He also endorses sporadic marijuana use especially when she was experiencing severe stress and not being able to eat.  However, she has not used any marijuana in the past 8 months to 1 year.  Past Psychiatric History:  Diagnoses: Bipolar disorder, alcohol use disorder in sustained remission, cannabis use, generalized anxiety disorder with panic attacks Medication trials: Olanzapine (prolonged Qtc), fluoxetine Hospitalizations: once in 2005, 2x in 2006,  and once in 2008 due to mania Suicide attempts: denies SIB: denies Hx of violence towards others: denies  Previous Psychotropic Medications: Yes   Substance Abuse  History in the last 12 months:  No.  Past Medical History:  Past Medical History:  Diagnosis Date   Arthritis    Bipolar 1 disorder (HCC)    Cancer (HCC)    vulva, and breast   Complication of anesthesia    wakes up during procedures   Depression    GAD (generalized anxiety disorder)    Genital HSV    currently per pt  no break out 03-22-2016    GERD (gastroesophageal reflux disease)    Graves disease    Hiatal hernia    History of cervical dysplasia    2012 laser ablation   History of esophageal dilatation    for dysphasia -- x2 dilated   History of gastric ulcer    History of Helicobacter pylori infection    remote hx   History of hidradenitis suppurativa    "gets all over body intermittantly"     History of hypertension    no issue since stopped drinking alcohol 2014   History of kidney stones    History of panic attacks    History of radiation therapy 03/16/2020-05/08/2020   vulva  Dr Antony Blackbird   History of radiation therapy 09/09/2020-10/23/2020   left chest wall/left SCV   Dr Antony Blackbird   Hypertension    Iron deficiency anemia    Left ureteral stone    OCD (obsessive compulsive disorder)    PONV (postoperative nausea and vomiting)    Pre-diabetes    PTSD (post-traumatic stress disorder)    Recovering alcoholic in remission (HCC)    since 2014   RLS (restless legs syndrome)    Smokers' cough (HCC)    Thyroid disease    Urgency of urination    Yeast infection involving the vagina and surrounding area    secondary to taking antibiotic    Past Surgical History:  Procedure Laterality Date   CESAREAN SECTION  1995   w/  Bilateral Tubal Ligation   COLONOSCOPY  last one 08-09-2013   CYSTOSCOPY W/ URETERAL STENT PLACEMENT Left 03/29/2016   Procedure: CYSTOSCOPY WITH STENT REPLACEMENT;  Surgeon: Hildred Laser, MD;  Location: [facility];  Service: Urology;  Laterality: Left;   CYSTOSCOPY WITH RETROGRADE PYELOGRAM, URETEROSCOPY AND STENT  PLACEMENT Left 03/08/2016   Procedure: CYSTOSCOPY WITH  LEFT RETROGRADE PYELOGRAM, AND STENT PLACEMENT;  Surgeon: Hildred Laser, MD;  Location: [facility];  Service: Urology;  Laterality: Left;   CYSTOSCOPY/RETROGRADE/URETEROSCOPY/STONE EXTRACTION WITH BASKET Left 03/29/2016   Procedure: CYSTOSCOPY/RETROGRADE/URETEROSCOPY/STONE EXTRACTION WITH BASKET;  Surgeon: Hildred Laser, MD;  Location: [facility];  Service: Urology;  Laterality: Left;   ENDOMETRIAL ABLATION W/ NOVASURE  04-01-2010   ESOPHAGOGASTRODUODENOSCOPY  last one 08-09-2013   KNEE ARTHROSCOPY Left as teen   LASER ABLATION OF THE CERVIX  2012 approx   MASTECTOMY WITH AXILLARY LYMPH NODE DISSECTION Left 07/27/2020   Procedure: LEFT MASTECTOMY WITH LEFT RADIOACTIVE SEED GUIDED TARGETED AXILLARY LYMPH NODE DISSECTION;  Surgeon: Ovidio Kin, MD;  Location: [facility];  Service: General;  Laterality: Left;   PORT-A-CATH REMOVAL Right 05/18/2021   Procedure: REMOVAL PORT-A-CATH;  Surgeon: Manus Rudd, MD;  Location: [facility];  Service: General;  Laterality: Right;   PORTACATH PLACEMENT Right 03/06/2020   Procedure: INSERTION PORT-A-CATH WITH ULTRASOUND GUIDANCE;  Surgeon: Ovidio Kin, MD;  Location: [facility];  Service: General;  Laterality: Right;   ROBOTIC ASSISTED TOTAL HYSTERECTOMY WITH BILATERAL SALPINGO OOPHERECTOMY N/A 05/18/2021   Procedure: XI ROBOTIC ASSISTED TOTAL HYSTERECTOMY WITH BILATERAL SALPINGO OOPHORECTOMY;  Surgeon: Adolphus Birchwood, MD;  Location: [facility];  Service: Gynecology;  Laterality: N/A;   TRANSTHORACIC ECHOCARDIOGRAM  05-19-2006   lvsf normal, ef 55-65%, there was mild flattening of the interventricular septum during diastoli/  RV size at upper limits normal   TUBAL LIGATION     VULVA /PERINEUM BIOPSY N/A 05/18/2021   Procedure: VULVAR BIOPSY;  Surgeon: Adolphus Birchwood, MD;  Location: [facility];  Service: Gynecology;  Laterality: N/A;   WISDOM TOOTH EXTRACTION  age 29 's      Family History:   Family History  Problem Relation Age of Onset   Heart disease Father    Lung cancer Father        d. 109   Alcohol abuse Father    Heart disease Mother    Depression Mother    Anxiety disorder Mother    Drug abuse Brother    Alcohol abuse Brother    Drug abuse Brother    ADD / ADHD Brother    Colon polyps Brother    Cancer Paternal Grandfather        "stomach"   Diabetes Maternal Grandfather    Diabetes Paternal Grandmother    Kidney disease Maternal Uncle    Cirrhosis Cousin        alcoholic   Anxiety disorder Maternal Aunt    Depression Maternal Aunt    Cancer Cousin        maternal; ovarian cancer or other "female" cancer?   Lung cancer Paternal Uncle 70   Throat cancer Cousin        paternal; dx 17s   Lung cancer Cousin        paternal; dx 63s    Social History:    Social History   Socioeconomic History   Marital status: Single    Spouse name: Not on file   Number of children: Not on file   Years of education: Not on file   Highest education level: Not on file  Occupational History   Not on file  Tobacco Use   Smoking status: Former    Current packs/day: 0.50    Types: Cigarettes   Smokeless tobacco: Never   Tobacco comments:    Recently started a smoking cessation class. Quit 03/31/23  Vaping Use   Vaping status: Never Used  Substance and Sexual Activity   Alcohol use: Not Currently   Drug use: Not Currently    Types: Marijuana    Comment: none in about 1 year   Sexual activity: Yes    Partners: Male    Birth control/protection: Surgical  Other Topics Concern   Not on file  Social History Narrative   Former [facility] patient.      Was on disability at one point.   Return to the workforce.  40 hours a week at [employer], 10 hours a week on the weekends at [employer] in [place], [employer] at night 10 to 12 hours a week.      Has grown children, she lives alone with a pet, continues to smoke no alcohol or drug use at this time       History of EtOH abuse and THC use.         Social Drivers of Health   Financial Resource Strain: Medium Risk (08/01/2023)  Overall Financial Resource Strain (CARDIA)    Difficulty of Paying Living Expenses: Somewhat hard  Food Insecurity: No Food Insecurity (08/01/2023)   Hunger Vital Sign    Worried About Running Out of Food in the Last Year: Never true    Ran Out of Food in the Last Year: Never true  Transportation Needs: No Transportation Needs (08/01/2023)   PRAPARE - Administrator, Civil Service (Medical): No    Lack of Transportation (Non-Medical): No  Physical Activity: Inactive (08/01/2023)   Exercise Vital Sign    Days of Exercise per Week: 0 days    Minutes of Exercise per Session: 0 min  Stress: Stress Concern Present (08/01/2023)   [employer] of Occupational Health - Occupational Stress Questionnaire    Feeling of Stress : Rather much  Social Connections: Socially Isolated (08/01/2023)   Social Connection and Isolation Panel [NHANES]    Frequency of Communication with Friends and Family: Three times a week    Frequency of Social Gatherings with Friends and Family: Never    Attends Religious Services: Never    Database administrator or Organizations: No    Attends Banker Meetings: Never    Marital Status: Never married    Additional Social History: updated  Allergies:   Allergies  Allergen Reactions   Dilaudid [Hydromorphone Hcl] Other (See Comments)    Pt became confused, pulled out iv's, does not remember anything   Fentanyl     Combative, delusional    Aspirin Hives    States able to tolerate Goody Powders and Ibuprofen without any problem    Depakote [Divalproex Sodium] Nausea And Vomiting   Minocycline Hives    Current Medications: Current Outpatient Medications  Medication Sig Dispense Refill   famotidine (PEPCID) 40 MG tablet Take 1 tablet (40 mg total) by mouth daily. 90 tablet 3   FLUoxetine (PROZAC) 10 MG  capsule Take 3 capsules (30 mg total) by mouth daily. (Patient taking differently: Take 10 mg by mouth daily. Take one pill for week and than discontinue) 90 capsule 2   Fremanezumab-vfrm (AJOVY) 225 MG/1.5ML SOAJ Inject 225 mg into the skin every 30 (thirty) days. 1.5 mL 11   gabapentin (NEURONTIN) 300 MG capsule Take 1 capsule (300 mg) by mouth daily. 30 capsule 1   LamoTRIgine (LAMICTAL XR) 200 MG TB24 24 hour tablet Take 1 tablet (200 mg total) by mouth at bedtime. 90 tablet 4   letrozole (FEMARA) 2.5 MG tablet Take 1 tablet by mouth daily. 90 tablet 3   lisinopril (ZESTRIL) 10 MG tablet Take 1 tablet (10 mg total) by mouth daily. Stop the Lisinopril/HCTZ combination 90 tablet 0   loratadine (CLARITIN REDITABS) 10 MG dissolvable tablet Take 10 mg by mouth daily. OTC     LORazepam (ATIVAN) 1 MG tablet Take 1 tablet (1 mg total) by mouth 3 (three) times daily as needed for anxiety 90 tablet 2   Magnesium 400 MG TABS Take 800 mg by mouth 2 (two) times daily. 120 tablet 1   methimazole (TAPAZOLE) 5 MG tablet Take one tablet (5 mg dose) by mouth daily. (Patient taking differently: Take 5 mg by mouth daily.) 90 tablet 3   Multiple Vitamin (MULTIVITAMIN WITH MINERALS) TABS tablet Take 1 tablet by mouth daily.     omeprazole (PRILOSEC) 40 MG capsule Take 1 capsule (40 mg total) by mouth daily before breakfast. 90 capsule 3   potassium chloride SA (KLOR-CON M) 20 MEQ tablet Take 1 tablet (  20 mEq) by mouth daily. 90 tablet 1   promethazine (PHENERGAN) 12.5 MG tablet Take 1 tablet (12.5 mg total) by mouth every 8 (eight) hours as needed for nausea or vomiting. 30 tablet 1   propranolol (INDERAL) 10 MG tablet Take 1 tablet by mouth 2 times daily. 180 tablet 0   spironolactone (ALDACTONE) 25 MG tablet Take 0.5 tablets (12.5 mg total) by mouth daily. 60 tablet 1   Ubrogepant (UBRELVY) 50 MG TABS Take 50 mg as needed for migraine, a second dose may be taken at least 2 hours after the initial dose. DO NOT EXCEED  2 doses in 24 hours.     vortioxetine HBr (TRINTELLIX) 5 MG TABS tablet 1 tablet (5 mg total) daily for 7 days, THEN 2 tablets (10 mg total) daily 60 tablet 0   No current facility-administered medications for this visit.    ROS: Review of Systems   Objective:  Psychiatric Specialty Exam: There were no vitals taken for this visit.There is no height or weight on file to calculate BMI.  General Appearance: Casual  Eye Contact:  Good  Speech:  Clear and Coherent and Normal Rate  Volume:  Normal  Mood:  Anxious and Depressed  Affect:  Appropriate and Congruent  Thought Content: Logical   Suicidal Thoughts:  No  Homicidal Thoughts:  No  Thought Process:  Coherent, Goal Directed, and Linear  Orientation:  Full (Time, Place, and Person)    Memory: Remote;   Fair  Judgment:  Intact  Insight:  Fair  Concentration:  Concentration: Fair  Recall:  not formally assessed   Fund of Knowledge: Fair  Language: Fair  Psychomotor Activity:  Normal  Akathisia:  No  AIMS (if indicated): not done  Assets:  Communication Skills Desire for Improvement Housing Leisure Time Physical Health Resilience Social Support  ADL's:  Intact  Cognition: WNL  Sleep:  Poor   PE: General: sits comfortably in view of camera; no acute distress  Pulm: no increased work of breathing on room air  MSK: all extremity movements appear intact  Neuro: no focal neurological deficits observed  Gait & Station: unable to assess by video    Metabolic Disorder Labs: Lab Results  Component Value Date   HGBA1C 5.3 04/10/2023   MPG 105.41 04/10/2023   MPG 128.37 05/06/2021   No results found for: "PROLACTIN" Lab Results  Component Value Date   CHOL 159 08/18/2022   TRIG 120 08/18/2022   HDL 51 08/18/2022   CHOLHDL 3.1 08/18/2022   VLDL 32 06/13/2008   LDLCALC 87 08/18/2022   LDLCALC 100 (H) 11/16/2020   Lab Results  Component Value Date   TSH 3.180 07/06/2023    Therapeutic Level Labs: No results  found for: "LITHIUM" No results found for: "CBMZ" No results found for: "VALPROATE"  Screenings:  GAD-7    Flowsheet Row Office Visit from 08/01/2023 in [facility] - A Dept Of [place]. [facility] Office Visit from 06/06/2023 in [facility] [place] - A Dept Of [place]. [facility] Office Visit from 04/14/2023 in [facility] [place] - A Dept Of [place]. [name] Office Visit from 03/06/2023 in [facility] Office Visit from 12/08/2022 in [facility] - A Dept Of [place]. [facility]  Total GAD-7 Score 19 18 19 11 15       PHQ2-9    Flowsheet Row Counselor from 09/25/2023 in  BEHAVIORAL HEALTH INTENSIVE PSYCH Office Visit from 08/01/2023 in [facility] [place] - A Dept Of [place]. [facility] Office Visit from 06/06/2023 in [facility] [place] - A Dept Of [place]. [facility] Office Visit from 04/14/2023 in [facility] - A Dept Of [place]. [facility] Office Visit from 03/06/2023 in [facility]  PHQ-2 Total Score 6 6 6 4 2   PHQ-9 Total Score 27 21 19 21 7       Flowsheet Row Counselor from 09/25/2023 in BEHAVIORAL HEALTH INTENSIVE [facility] ED from 08/29/2023 in [facility] Emergency Department at [facility] ED to Hosp-Admission (Discharged) from 04/10/2023 in [place] [facility] PCU  C-SSRS RISK CATEGORY Error: Question 6 not populated No Risk No Risk       Collaboration of Care: Collaboration of Care:   Patient/Guardian was advised Release of Information must be obtained prior to any record release in order to collaborate their care with an outside provider. Patient/Guardian was advised if they have not already done so to contact the registration department to sign all necessary forms in order for [place] to release information regarding their care.   Consent: Patient/Guardian gives verbal  consent for treatment and assignment of benefits for services provided during this visit. Patient/Guardian expressed understanding and agreed to proceed.   Televisit via video: I connected with Keleigh C Egelston on 10/02/23 at  9:00 AM EST by a video enabled telemedicine application and verified that I am speaking with the correct person using two identifiers.  Location: Patient: home Provider: office   I discussed the limitations of evaluation and management by telemedicine and the availability of in person appointments. The patient expressed understanding and agreed to proceed.  I discussed the assessment and treatment plan with the patient. The patient was provided an opportunity to ask questions and all were answered. The patient agreed with the plan and demonstrated an understanding of the instructions.   The patient was advised to call back or seek an in-person evaluation if the symptoms worsen or if the condition fails to improve as anticipated.  Park Pope, MD 1/27/20259:03 AM

## 2023-10-02 NOTE — Progress Notes (Signed)
Patient ID: Sandra Miller, female    DOB: Apr 01, 1976  MRN: [id number]  CC: Hypertension (HTN f/u. Med refill. [facility] ER visit on 08/29/23/Yes to Tdap vax.)   Subjective: Sandra Miller is a 48 y.o. female who presents for chronic ds management. Her concerns today include:  Patient with history of HTN, HL, preDM, hypokalemia,  obesity, tob dep, RLS, DVT of the RT IJV and surrounding IJ port 04/2020 GERD/functional dyspepsia (on gabapentin and  omeprazole), hidradenitis suppurativa, breast CA left dx 02/2020 (ERP s/p left mastectomy, neoadjuvant chemotherapy and adjuvant XRT; Dr. Pamelia Hoit).  Vulvar cancer (dx 01/2020), HSV infection, genitial warts, bipolar disorder/dep/anx (Dr. Lolly Mustache), has applied for disability, Hyperthyroid  Discussed the use of AI scribe software for clinical note transcription with the patient, who gave verbal consent to proceed.  History of Present Illness   Weakness in the hands: When we saw her back in November, we extended her time out of work due to weakness in both hands/grip and referred her to physical therapy.  She was supposed to start physical therapy in late December but did not go because around that time she had been seen in the emergency room for flank pain.  Now doing physical therapy twice a week.  States she was told by the therapist that strength in the right hand has improved but the left hand is still weak.  Plan is to continue more therapy through end of February.    Kidney stones: Seen in the ER in December for flank pain.  CT scan showed bilateral urolithiasis with no ureteral obstruction.  Patient reports having to have surgery more than 6 years ago due to an obstructive stone.  Requesting referral to urology.  Bipolar/MDD/GAD: She continues to struggle with severe depression and anxiety.  Does not want to be around people so she isolates in her bedroom eating junk snacks.   She has been released by the neurologist to drive but she is anxious about  doing so out of fear that she may have another seizure.  -she is plugged in with behavioral health.  Her psychiatrist has made some changes in her medications and recommends that she start intensive outpatient therapy program.  This will involve her being seen 5 days a week from 9 a.m-12 p.m.  During these sessions she will meet with social worker, psychiatrist and have group therapy.  I received a message from the caseworker, Ms. Corinna Lines, earlier today requesting that her time out of work be extended to allow her to participate in the intensive outpatient program that will last 3 weeks. -The patient also reports ongoing fatigue, which she attributes to her medications and depression. She has been taken off Keppra and her dose of Lamictal has been increased. Despite these changes, the patient reports no improvement in her energy levels.   Seizure: Her neurologist Dr. [name] felt this was due to electrolyte abnormalities.  Keppra has been stopped.  Lamictal was increased to help with bipolar disorder.  HTN: Reports compliance with lisinopril 10 mg daily, spironolactone 25 mg half tablet daily and propranolol 10 mg twice a day.  She takes the lisinopril at nighttime.  Not checking blood pressure because if it is high she will get very anxious.  Eats a lot of pickles which she states is salted.  Her potassium and Mg levels have been holding normal since we decreased the dose of omeprazole from 40 mg twice a day to once a day dosing.  She is on magnesium  oxide 800 mg daily and potassium 20 mEq daily    Patient needs a letter if I will extend her time out of work.  She also request that a copy of my note from today be faxed to [facility] attention [place]. C [phone].  I did repeat back method to ensure the accuracy of the fax number.  She is receiving short-term disability through [employer].  Hypothyroidism: She saw her endocrinologist earlier this week through [place].  She continues  on Tapazole 5 mg daily.  According to his note, he plans to wean her off if hormone levels came back normal Patient Active Problem List   Diagnosis Date Noted   Chronic migraine without aura, with intractable migraine, so stated, with status migrainosus 08/22/2023   Mood disorder (HCC) 05/29/2023   Fatigue 05/15/2023   Prolonged QT interval 04/10/2023   Dizziness 04/10/2023   Provoked seizures (HCC) 03/31/2023   Respiratory depression 03/31/2023   Status epilepticus (HCC) 03/31/2023   Migraine with aura and without status migrainosus, not intractable 10/03/2022   Pain in right knee 09/07/2022   Bilateral primary osteoarthritis of knee 09/07/2022   Chronic migraine without aura without status migrainosus, not intractable 07/07/2022   Graves disease 05/10/2022   Sebaceous cyst 12/06/2021   Pain in left knee 11/03/2021   Hyperthyroidism 08/13/2021   Bipolar 1 disorder (HCC) 05/07/2021   Obesity (BMI 30.0-34.9) 05/07/2021   Hyperlipidemia 08/12/2020   Prediabetes 08/12/2020   Hypomagnesemia 08/12/2020   Mutation in BRIP1 gene 06/24/2020   Hypokalemia 06/17/2020   Dehydration    Colitis 06/16/2020   Port-A-Cath in place 03/11/2020   Malignant neoplasm of upper-inner quadrant of left breast in female, estrogen receptor positive (HCC) 02/25/2020   Vulvar cancer (HCC) 01/29/2020   Vulvar ulceration 01/23/2020   Essential hypertension 10/31/2019   Nausea and vomiting 12/27/2018   Seasonal allergic rhinitis 12/27/2018   History of ELISA positive for HSV 10/01/2018   Restless leg syndrome 04/01/2016   Nephrolithiasis 03/08/2016   Obstructive pyelonephritis 03/08/2016   Normocytic anemia 03/08/2016   Hidradenitis suppurativa of left axilla 11/03/2015   Loss of weight 09/08/2015   Neck pain on left side 10/22/2014   Genital warts 05/20/2014   GERD (gastroesophageal reflux disease) 12/26/2012   Functional dyspepsia 12/26/2012   Tobacco abuse 08/22/2012   Herpes simplex virus (HSV)  infection 02/03/2007     Current Outpatient Medications on File Prior to Visit  Medication Sig Dispense Refill   famotidine (PEPCID) 40 MG tablet Take 1 tablet (40 mg total) by mouth daily. 90 tablet 3   Fremanezumab-vfrm (AJOVY) 225 MG/1.5ML SOAJ Inject 225 mg into the skin every 30 (thirty) days. 1.5 mL 11   gabapentin (NEURONTIN) 300 MG capsule Take 1 capsule (300 mg) by mouth daily. 30 capsule 1   LamoTRIgine (LAMICTAL XR) 200 MG TB24 24 hour tablet Take 1 tablet (200 mg total) by mouth at bedtime. 90 tablet 4   letrozole (FEMARA) 2.5 MG tablet Take 1 tablet by mouth daily. 90 tablet 3   loratadine (CLARITIN REDITABS) 10 MG dissolvable tablet Take 10 mg by mouth daily. OTC     LORazepam (ATIVAN) 1 MG tablet Take 1 tablet (1 mg total) by mouth 3 (three) times daily as needed for anxiety 90 tablet 2   Magnesium 400 MG TABS Take 800 mg by mouth 2 (two) times daily. 120 tablet 1   methimazole (TAPAZOLE) 5 MG tablet Take one tablet (5 mg dose) by mouth daily. (Patient taking differently: Take  5 mg by mouth daily.) 90 tablet 3   Multiple Vitamin (MULTIVITAMIN WITH MINERALS) TABS tablet Take 1 tablet by mouth daily.     omeprazole (PRILOSEC) 40 MG capsule Take 1 capsule (40 mg total) by mouth daily before breakfast. 90 capsule 3   potassium chloride SA (KLOR-CON M) 20 MEQ tablet Take 1 tablet (20 mEq) by mouth daily. 90 tablet 1   promethazine (PHENERGAN) 12.5 MG tablet Take 1 tablet (12.5 mg total) by mouth every 8 (eight) hours as needed for nausea or vomiting. 30 tablet 1   Ubrogepant (UBRELVY) 50 MG TABS Take 50 mg as needed for migraine, a second dose may be taken at least 2 hours after the initial dose. DO NOT EXCEED 2 doses in 24 hours.     No current facility-administered medications on file prior to visit.    Allergies  Allergen Reactions   Dilaudid [Hydromorphone Hcl] Other (See Comments)    Pt became confused, pulled out iv's, does not remember anything   Fentanyl     Combative,  delusional    Aspirin Hives    States able to tolerate Goody Powders and Ibuprofen without any problem    Depakote [Divalproex Sodium] Nausea And Vomiting   Minocycline Hives    Social History   Socioeconomic History   Marital status: Single    Spouse name: Not on file   Number of children: Not on file   Years of education: Not on file   Highest education level: Associate degree: academic program  Occupational History   Not on file  Tobacco Use   Smoking status: Former    Current packs/day: 0.50    Types: Cigarettes   Smokeless tobacco: Never   Tobacco comments:    Recently started a smoking cessation class. Quit 03/31/23  Vaping Use   Vaping status: Never Used  Substance and Sexual Activity   Alcohol use: Not Currently   Drug use: Not Currently    Types: Marijuana    Comment: none in about 1 year   Sexual activity: Yes    Partners: Male    Birth control/protection: Surgical  Other Topics Concern   Not on file  Social History Narrative   Former [facility] patient.      Was on disability at one point.   Return to the workforce.  40 hours a week at [employer], 10 hours a week on the weekends at [employer] in [place], [employer] at night 10 to 12 hours a week.      Has grown children, she lives alone with a pet, continues to smoke no alcohol or drug use at this time      History of EtOH abuse and THC use.         Social Drivers of Corporate investment banker Strain: Low Risk  (10/02/2023)   Overall Financial Resource Strain (CARDIA)    Difficulty of Paying Living Expenses: Not very hard  Recent Concern: Financial Resource Strain - Medium Risk (08/01/2023)   Overall Financial Resource Strain (CARDIA)    Difficulty of Paying Living Expenses: Somewhat hard  Food Insecurity: No Food Insecurity (10/02/2023)   Hunger Vital Sign    Worried About Running Out of Food in the Last Year: Never true    Ran Out of Food in the Last Year: Never true  Recent Concern:  Food Insecurity - Food Insecurity Present (09/28/2023)   Received from [facility]   Hunger Vital Sign    Worried About Running  Out of Food in the Last Year: Sometimes true    Ran Out of Food in the Last Year: Patient declined  Transportation Needs: Unmet Transportation Needs (10/02/2023)   PRAPARE - Administrator, Civil Service (Medical): Yes    Lack of Transportation (Non-Medical): No  Physical Activity: Inactive (10/02/2023)   Exercise Vital Sign    Days of Exercise per Week: 0 days    Minutes of Exercise per Session: 0 min  Stress: Stress Concern Present (10/02/2023)   [employer] of Occupational Health - Occupational Stress Questionnaire    Feeling of Stress : Rather much  Social Connections: Moderately Isolated (10/02/2023)   Social Connection and Isolation Panel [NHANES]    Frequency of Communication with Friends and Family: More than three times a week    Frequency of Social Gatherings with Friends and Family: Never    Attends Religious Services: 1 to 4 times per year    Active Member of [employer] or Organizations: No    Attends Banker Meetings: Never    Marital Status: Never married  Intimate Partner Violence: Not At Risk (09/28/2023)   Received from [facility]   HITS    Over the last 12 months how often did your partner physically hurt you?: Never    Over the last 12 months how often did your partner insult you or talk down to you?: Never    Over the last 12 months how often did your partner threaten you with physical harm?: Never    Over the last 12 months how often did your partner scream or curse at you?: Never    Family History  Problem Relation Age of Onset   Heart disease Father    Lung cancer Father        d. 61   Alcohol abuse Father    Heart disease Mother    Depression Mother    Anxiety disorder Mother    Drug abuse Brother    Alcohol abuse Brother    Drug abuse Brother    ADD / ADHD Brother    Colon polyps Brother     Cancer Paternal Grandfather        "stomach"   Diabetes Maternal Grandfather    Diabetes Paternal Grandmother    Kidney disease Maternal Uncle    Cirrhosis Cousin        alcoholic   Anxiety disorder Maternal Aunt    Depression Maternal Aunt    Cancer Cousin        maternal; ovarian cancer or other "female" cancer?   Lung cancer Paternal Uncle 100   Throat cancer Cousin        paternal; dx 6s   Lung cancer Cousin        paternal; dx 50s    Past Surgical History:  Procedure Laterality Date   CESAREAN SECTION  1995   w/  Bilateral Tubal Ligation   COLONOSCOPY  last one 08-09-2013   CYSTOSCOPY W/ URETERAL STENT PLACEMENT Left 03/29/2016   Procedure: CYSTOSCOPY WITH STENT REPLACEMENT;  Surgeon: Hildred Laser, MD;  Location[facility];  Service: Urology;  Laterality: Left;   CYSTOSCOPY WITH RETROGRADE PYELOGRAM, URETEROSCOPY AND STENT PLACEMENT Left 03/08/2016   Procedure: CYSTOSCOPY WITH  LEFT RETROGRADE PYELOGRAM, AND STENT PLACEMENT;  Surgeon: Hildred Laser, MD;  Location: [facility];  Service: Urology;  Laterality: Left;   CYSTOSCOPY/RETROGRADE/URETEROSCOPY/STONE EXTRACTION WITH BASKET Left 03/29/2016   Procedure: CYSTOSCOPY/RETROGRADE/URETEROSCOPY/STONE EXTRACTION WITH BASKET;  Surgeon: Cloyde Reams  Sherryl Barters, MD;  Location: [facility];  Service: Urology;  Laterality: Left;   ENDOMETRIAL ABLATION W/ NOVASURE  04-01-2010   ESOPHAGOGASTRODUODENOSCOPY  last one 08-09-2013   KNEE ARTHROSCOPY Left as teen   LASER ABLATION OF THE CERVIX  2012 approx   MASTECTOMY WITH AXILLARY LYMPH NODE DISSECTION Left 07/27/2020   Procedure: LEFT MASTECTOMY WITH LEFT RADIOACTIVE SEED GUIDED TARGETED AXILLARY LYMPH NODE DISSECTION;  Surgeon: Ovidio Kin, MD;  Location: [facility];  Service: General;  Laterality: Left;   PORT-A-CATH REMOVAL Right 05/18/2021   Procedure: REMOVAL PORT-A-CATH;  Surgeon: Manus Rudd, MD;  Location: [facility];  Service: General;  Laterality: Right;    PORTACATH PLACEMENT Right 03/06/2020   Procedure: INSERTION PORT-A-CATH WITH ULTRASOUND GUIDANCE;  Surgeon: Ovidio Kin, MD;  Location: [facility];  Service: General;  Laterality: Right;   ROBOTIC ASSISTED TOTAL HYSTERECTOMY WITH BILATERAL SALPINGO OOPHERECTOMY N/A 05/18/2021   Procedure: XI ROBOTIC ASSISTED TOTAL HYSTERECTOMY WITH BILATERAL SALPINGO OOPHORECTOMY;  Surgeon: Adolphus Birchwood, MD;  Location: [facility];  Service: Gynecology;  Laterality: N/A;   TRANSTHORACIC ECHOCARDIOGRAM  05-19-2006   lvsf normal, ef 55-65%, there was mild flattening of the interventricular septum during diastoli/  RV size at upper limits normal   TUBAL LIGATION     VULVA /PERINEUM BIOPSY N/A 05/18/2021   Procedure: VULVAR BIOPSY;  Surgeon: Adolphus Birchwood, MD;  Location: [facility];  Service: Gynecology;  Laterality: N/A;   WISDOM TOOTH EXTRACTION  age 18 's    ROS: Review of Systems Negative except as stated above  PHYSICAL EXAM: BP (!) 145/83 (BP Location: Right Arm, Patient Position: Sitting, Cuff Size: Large)   Pulse 99   Temp 98.2 F (36.8 C) (Oral)   Ht 5\' 6"  (1.676 m)   Wt 205 lb (93 kg)   SpO2 100%   BMI 33.09 kg/m   Physical Exam  General appearance - alert, well appearing, and in no distress Mental status -patient appears nervous.  She shakes her legs constantly.  She is tearful at times when talking about her depression and anxiety. Neck - supple, no significant adenopathy Chest - clear to auscultation, no wheezes, rales or rhonchi, symmetric air entry Heart - normal rate, regular rhythm, normal S1, S2, no murmurs, rubs, clicks or gallops Musculoskeletal -grip 5/5 on the right, 4/5 on the left.  No wasting of intrinsic muscles of the hands. Extremities - peripheral pulses normal, no pedal edema, no clubbing or cyanosis      Latest Ref Rng & Units 08/29/2023    6:22 AM 07/06/2023    3:37 PM 06/02/2023    2:55 PM  CMP  Glucose 70 - 99 mg/dL 161  97  096   BUN 6 - 20 mg/dL 11  15   15    Creatinine 0.44 - 1.00 mg/dL 0.45  4.09  8.11   Sodium 135 - 145 mmol/L 135  138  135   Potassium 3.5 - 5.1 mmol/L 3.6  4.7  4.3   Chloride 98 - 111 mmol/L 100  100  100   CO2 22 - 32 mmol/L 23  22  26    Calcium 8.9 - 10.3 mg/dL 91.4  78.2  95.6   Total Protein 6.5 - 8.1 g/dL 8.3  7.7  8.0   Total Bilirubin <1.2 mg/dL 1.1  0.3  0.5   Alkaline Phos 38 - 126 U/L 71  92  56   AST 15 - 41 U/L 22  17  16    ALT 0 -  44 U/L 15  13  10     Lipid Panel     Component Value Date/Time   CHOL 159 08/18/2022 1605   TRIG 120 08/18/2022 1605   HDL 51 08/18/2022 1605   CHOLHDL 3.1 08/18/2022 1605   CHOLHDL 4.7 Ratio 06/13/2008 2303   VLDL 32 06/13/2008 2303   LDLCALC 87 08/18/2022 1605    CBC    Component Value Date/Time   WBC 7.8 08/29/2023 0622   RBC 5.65 (H) 08/29/2023 0622   HGB 16.3 (H) 08/29/2023 0622   HGB 12.9 11/22/2022 1633   HCT 50.7 (H) 08/29/2023 0622   HCT 38.6 11/22/2022 1633   PLT 226 08/29/2023 0622   PLT 318 11/22/2022 1633   MCV 89.7 08/29/2023 0622   MCV 89 11/22/2022 1633   MCH 28.8 08/29/2023 0622   MCHC 32.1 08/29/2023 0622   RDW 13.9 08/29/2023 0622   RDW 13.6 11/22/2022 1633   LYMPHSABS 1.4 08/29/2023 0622   LYMPHSABS 3.9 (H) 11/22/2022 1633   MONOABS 0.4 08/29/2023 0622   EOSABS 0.1 08/29/2023 0622   EOSABS 0.1 11/22/2022 1633   BASOSABS 0.0 08/29/2023 0622   BASOSABS 0.0 11/22/2022 1633    ASSESSMENT AND PLAN: 1. Essential hypertension (Primary) Not at goal.  However patient very anxious and tearful today.  I have made no changes in her medications because of this.  She will continue lisinopril 10 mg daily, spironolactone 12.5 mg daily and propranolol 10 mg twice a day. - lisinopril (ZESTRIL) 10 MG tablet; Take 1 tablet (10 mg total) by mouth daily. Stop the Lisinopril/HCTZ combination  Dispense: 90 tablet; Refill: 1 - spironolactone (ALDACTONE) 25 MG tablet; Take 0.5 tablets (12.5 mg total) by mouth daily.  Dispense: 60 tablet; Refill: 1  2.  Provoked seizures (HCC) She has had only 1 seizure that occurred in the setting of electrolyte abnormalities.  She has been seizure-free for 6 months.  Her psychiatrist has released her to drive but she is too anxious to do so.  She tells me that her consult has challenged her to start driving one day a wk to gradually work herself back to driving at will.  3. Hyperthyroidism Followed by endocrinology. - propranolol (INDERAL) 10 MG tablet; Take 1 tablet by mouth 2 times daily.  Dispense: 180 tablet; Refill: 1  4. Hypokalemia Chemistry done last month revealed normal potassium level.  She will continue spironolactone and potassium 20 mEq daily - spironolactone (ALDACTONE) 25 MG tablet; Take 0.5 tablets (12.5 mg total) by mouth daily.  Dispense: 60 tablet; Refill: 1  5. Kidney stones - Ambulatory referral to Urology  6. Severe episode of recurrent major depressive disorder, without psychotic features (HCC) 7. GAD (generalized anxiety disorder) Plugged in with behavioral health.  She has started intensive outpatient treatment program at the last for 3 weeks.  We will extend her time out of work to incorporate this time plus the time that she will need to complete physical therapy on her hands  8. Weakness of left hand She is doing physical therapy plans to go until the end of February.  We will extend her time out of work until 11/06/2023  9. Hypomagnesemia - Magnesium  10. Encounter for immunization - Tdap vaccine greater than or equal to 7yo IM  Note will [facility] faxed to [facility] per pt's request.  Patient was given the opportunity to ask questions.  Patient verbalized understanding of the plan and was able to repeat key elements of the plan.  This documentation was completed using Paediatric nurse.  Any transcriptional errors are unintentional.  Orders Placed This Encounter  Procedures   Tdap vaccine greater than or equal to 7yo IM   Magnesium   Ambulatory  referral to Urology     Requested Prescriptions   Signed Prescriptions Disp Refills   lisinopril (ZESTRIL) 10 MG tablet 90 tablet 1    Sig: Take 1 tablet (10 mg total) by mouth daily. Stop the Lisinopril/HCTZ combination   propranolol (INDERAL) 10 MG tablet 180 tablet 1    Sig: Take 1 tablet by mouth 2 times daily.   spironolactone (ALDACTONE) 25 MG tablet 60 tablet 1    Sig: Take 0.5 tablets (12.5 mg total) by mouth daily.    Return in about 4 weeks (around 10/30/2023).  Jonah Blue, MD, FACP

## 2023-10-02 NOTE — Progress Notes (Signed)
Virtual Visit via Video Note   I connected with Nidya C. Claflin on 10/02/23 at  9:00 AM EDT by a video enabled telemedicine application and verified that I am speaking with the correct person using two identifiers.   At orientation to the IOP program, Case Manager discussed the limitations of evaluation and management by telemedicine and the availability of in person appointments. The patient expressed understanding and agreed to proceed with virtual visits throughout the duration of the program.   Location:  Patient: Patient Home Provider: [facility]   History of Present Illness: Bipolar I Disorder and GAD   Observations/Objective: Check In: Case Manager checked in with all participants to review discharge dates, insurance authorizations, work-related documents and needs from the treatment team regarding medications. Nakyah stated needs and engaged in discussion.    Initial Therapeutic Activity: Counselor facilitated a check-in with Calleen to assess for safety, sobriety and medication compliance.  Counselor also inquired about Danaye's current emotional ratings, as well as any significant changes in thoughts, feelings or behavior since previous check in.  Lacrystal presented for session on time and was alert, oriented x5, with no evidence or self-report of active SI/HI or A/V H.  Chasmine reported compliance with medication and denied use of alcohol or illicit substances.  Maicee reported scores of 10/10 for depression, 8/10 for anxiety, and 0/10 for anger/irritability.  Rakesha denied any recent outbursts or panic attacks.  Kattie reported that a struggle has been dealing with health issues since 2021, including cancer and seizures which led to a coma.  Lillieanna stated "That led me to become really depressed".  Kazzandra reported that her goal today is to attend a doctor's appointment after group.      Second Therapeutic Activity: Counselor discussed topic of sleep hygiene today with group.   Counselor defined this as the habits, behaviors and environmental factors that can be adjusted to improve overall sleep quality.  Counselor also discussed how lack of consistent sleep can negatively affect mood and overall mental health.  Counselor provided members with a screening to complete assessing typical barriers one might face in achieving quality sleep at night (i.e. struggling to get up in the morning, waking up throughout the night, tossing/turning, etc), and inquired about members' perception of sleep hygiene at present.  Counselor offered techniques to members via a virtual handout which could be implemented in order to improve sleep hygiene, such as sticking to a normal morning/night routine, avoiding using of electronics too close to bedtime, avoiding heavy meals before bed, using a sleep journal to track changes and address anxious thoughts, as well as avoiding naps during the daytime, ensuring proper use of medications if prescribed any by provider(s), and more.  Counselor inquired about changes members intend to make to sleep hygiene based upon information covered today.  Interventions were effective, as evidenced by Naylee actively participating in discussion on subject, reporting that she has been struggling with poor sleep for several years, and there could be additional medical issues interfering with this, such as snoring and restless leg syndrome.  Rosia also completed a sleep assessment, which revealed that she is moderately sleep deprived at present.  Jaycee reported that she plans to improve sleep hygiene over the course of treatment by following a more consistent sleep/wake cycle each day to develop a routine, reading a book to make her tired and easier to fall asleep, avoiding use of caffeine too close to bedtime, getting out of the bedroom more often during the daytime,  drinking some caffeine free tea at bedtime, avoiding clock watching during the night, and getting more daily  exercise to ensure she is tired at night.   Assessment and Plan: Counselor recommends that Camdyn remain in IOP treatment to better manage mental health symptoms, ensure stability and pursue completion of treatment plan goals. Counselor recommends adherence to crisis/safety plan, taking medications as prescribed, and following up with medical professionals if any issues arise.    Follow Up Instructions: Counselor will send Webex link for session tomorrow.  Keonta was advised to call back or seek an in-person evaluation if the symptoms worsen or if the condition fails to improve as anticipated.   Collaboration of Care:   Medication Management AEB Hillery Jacks, NP or Dr. Park Pope                                          Case Manager AEB Jeri Modena, CNA    Patient/Guardian was advised Release of Information must be obtained prior to any record release in order to collaborate their care with an outside provider. Patient/Guardian was advised if they have not already done so to contact the registration department to sign all necessary forms in order for [place] to release information regarding their care.    Consent: Patient/Guardian gives verbal consent for treatment and assignment of benefits for services provided during this visit. Patient/Guardian expressed understanding and agreed to proceed.   I provided 180 minutes of non-face-to-face time during this encounter.   Noralee Stain, [place], LCAS 10/02/23

## 2023-10-03 ENCOUNTER — Other Ambulatory Visit (HOSPITAL_COMMUNITY): Payer: BC Managed Care – PPO | Admitting: Licensed Clinical Social Worker

## 2023-10-03 ENCOUNTER — Ambulatory Visit: Payer: BC Managed Care – PPO

## 2023-10-03 ENCOUNTER — Telehealth: Payer: PRIVATE HEALTH INSURANCE | Admitting: Adult Health

## 2023-10-03 ENCOUNTER — Encounter (HOSPITAL_COMMUNITY): Payer: Self-pay

## 2023-10-03 ENCOUNTER — Other Ambulatory Visit (HOSPITAL_COMMUNITY): Payer: Self-pay

## 2023-10-03 DIAGNOSIS — M25512 Pain in left shoulder: Secondary | ICD-10-CM

## 2023-10-03 DIAGNOSIS — Z853 Personal history of malignant neoplasm of breast: Secondary | ICD-10-CM | POA: Diagnosis not present

## 2023-10-03 DIAGNOSIS — E0500 Thyrotoxicosis with diffuse goiter without thyrotoxic crisis or storm: Secondary | ICD-10-CM | POA: Diagnosis not present

## 2023-10-03 DIAGNOSIS — F1011 Alcohol abuse, in remission: Secondary | ICD-10-CM | POA: Diagnosis present

## 2023-10-03 DIAGNOSIS — F319 Bipolar disorder, unspecified: Secondary | ICD-10-CM | POA: Diagnosis present

## 2023-10-03 DIAGNOSIS — Z79899 Other long term (current) drug therapy: Secondary | ICD-10-CM | POA: Diagnosis not present

## 2023-10-03 DIAGNOSIS — Z8544 Personal history of malignant neoplasm of other female genital organs: Secondary | ICD-10-CM | POA: Diagnosis not present

## 2023-10-03 DIAGNOSIS — M6281 Muscle weakness (generalized): Secondary | ICD-10-CM

## 2023-10-03 DIAGNOSIS — F411 Generalized anxiety disorder: Secondary | ICD-10-CM

## 2023-10-03 LAB — MAGNESIUM: Magnesium: 1.7 mg/dL (ref 1.6–2.3)

## 2023-10-03 NOTE — Therapy (Addendum)
 OUTPATIENT PHYSICAL THERAPY TREATMENT NOTE/DISCHARGE  PHYSICAL THERAPY DISCHARGE SUMMARY  Visits from Start of Care: 4  Current functional level related to goals / functional outcomes: See goals/objective   Remaining deficits: Unable to assess   Education / Equipment: HEP   Patient agrees to discharge. Patient goals were unable to assess. Patient is being discharged due to not returning since the last visit.     Patient Name: LEANNAH GUSE MRN: [id number] DOB:1976-06-02, 48 y.o., female Today's Date: 10/03/2023   END OF SESSION:  PT End of Session - 10/03/23 1257     Visit Number 4    Number of Visits 17    Date for PT Re-Evaluation 11/03/23    Authorization Type BCBS / MCD    Authorization Time Period 09/13/2023 - 09/26/2023    Authorization - Number of Visits 3    PT Start Time 1315    PT Stop Time 1357    PT Time Calculation (min) 42 min    Activity Tolerance Patient tolerated treatment well    Behavior During Therapy WFL for tasks assessed/performed                Past Medical History:  Diagnosis Date   Arthritis    Bipolar 1 disorder (HCC)    Cancer (HCC)    vulva, and breast   Complication of anesthesia    wakes up during procedures   Depression    GAD (generalized anxiety disorder)    Genital HSV    currently per pt  no break out 03-22-2016    GERD (gastroesophageal reflux disease)    Graves disease    Hiatal hernia    History of cervical dysplasia    2012 laser ablation   History of esophageal dilatation    for dysphasia -- x2 dilated   History of gastric ulcer    History of Helicobacter pylori infection    remote hx   History of hidradenitis suppurativa    gets all over body intermittantly     History of hypertension    no issue since stopped drinking alcohol 2014   History of kidney stones    History of panic attacks    History of radiation therapy 03/16/2020-05/08/2020   vulva  Dr Lynwood Nasuti   History of radiation therapy  09/09/2020-10/23/2020   left chest wall/left SCV   Dr Lynwood Nasuti   Hypertension    Iron deficiency anemia    Left ureteral stone    OCD (obsessive compulsive disorder)    PONV (postoperative nausea and vomiting)    Pre-diabetes    PTSD (post-traumatic stress disorder)    Recovering alcoholic in remission (HCC)    since 2014   RLS (restless legs syndrome)    Smokers' cough (HCC)    Thyroid  disease    Urgency of urination    Yeast infection involving the vagina and surrounding area    secondary to taking antibiotic   Past Surgical History:  Procedure Laterality Date   CESAREAN SECTION  1995   w/  Bilateral Tubal Ligation   COLONOSCOPY  last one 08-09-2013   CYSTOSCOPY W/ URETERAL STENT PLACEMENT Left 03/29/2016   Procedure: CYSTOSCOPY WITH STENT REPLACEMENT;  Surgeon: Redell Lynwood Napoleon, MD;  Location: [facility];  Service: Urology;  Laterality: Left;   CYSTOSCOPY WITH RETROGRADE PYELOGRAM, URETEROSCOPY AND STENT PLACEMENT Left 03/08/2016   Procedure: CYSTOSCOPY WITH  LEFT RETROGRADE PYELOGRAM, AND STENT PLACEMENT;  Surgeon: Redell Lynwood Napoleon, MD;  Location: [facility];  Service: Urology;  Laterality: Left;   CYSTOSCOPY/RETROGRADE/URETEROSCOPY/STONE EXTRACTION WITH BASKET Left 03/29/2016   Procedure: CYSTOSCOPY/RETROGRADE/URETEROSCOPY/STONE EXTRACTION WITH BASKET;  Surgeon: Redell Lynwood Napoleon, MD;  Location: [facility];  Service: Urology;  Laterality: Left;   ENDOMETRIAL ABLATION W/ NOVASURE  04-01-2010   ESOPHAGOGASTRODUODENOSCOPY  last one 08-09-2013   KNEE ARTHROSCOPY Left as teen   LASER ABLATION OF THE CERVIX  2012 approx   MASTECTOMY WITH AXILLARY LYMPH NODE DISSECTION Left 07/27/2020   Procedure: LEFT MASTECTOMY WITH LEFT RADIOACTIVE SEED GUIDED TARGETED AXILLARY LYMPH NODE DISSECTION;  Surgeon: Ethyl Lenis, MD;  Location: [facility];  Service: General;  Laterality: Left;   PORT-A-CATH REMOVAL Right 05/18/2021   Procedure: REMOVAL PORT-A-CATH;  Surgeon:  Belinda Cough, MD;  Location: [facility];  Service: General;  Laterality: Right;   PORTACATH PLACEMENT Right 03/06/2020   Procedure: INSERTION PORT-A-CATH WITH ULTRASOUND GUIDANCE;  Surgeon: Ethyl Lenis, MD;  Location: [facility];  Service: General;  Laterality: Right;   ROBOTIC ASSISTED TOTAL HYSTERECTOMY WITH BILATERAL SALPINGO OOPHERECTOMY N/A 05/18/2021   Procedure: XI ROBOTIC ASSISTED TOTAL HYSTERECTOMY WITH BILATERAL SALPINGO OOPHORECTOMY;  Surgeon: Eloy Herring, MD;  Location: [facility];  Service: Gynecology;  Laterality: N/A;   TRANSTHORACIC ECHOCARDIOGRAM  05-19-2006   lvsf normal, ef 55-65%, there was mild flattening of the interventricular septum during diastoli/  RV size at upper limits normal   TUBAL LIGATION     VULVA /PERINEUM BIOPSY N/A 05/18/2021   Procedure: VULVAR BIOPSY;  Surgeon: Eloy Herring, MD;  Location: [facility];  Service: Gynecology;  Laterality: N/A;   WISDOM TOOTH EXTRACTION  age 72 's   Patient Active Problem List   Diagnosis Date Noted   Chronic migraine without aura, with intractable migraine, so stated, with status migrainosus 08/22/2023   Mood disorder (HCC) 05/29/2023   Fatigue 05/15/2023   Prolonged QT interval 04/10/2023   Dizziness 04/10/2023   Provoked seizures (HCC) 03/31/2023   Respiratory depression 03/31/2023   Status epilepticus (HCC) 03/31/2023   Migraine with aura and without status migrainosus, not intractable 10/03/2022   Pain in right knee 09/07/2022   Bilateral primary osteoarthritis of knee 09/07/2022   Chronic migraine without aura without status migrainosus, not intractable 07/07/2022   Graves disease 05/10/2022   Sebaceous cyst 12/06/2021   Pain in left knee 11/03/2021   Hyperthyroidism 08/13/2021   Bipolar 1 disorder (HCC) 05/07/2021   Obesity (BMI 30.0-34.9) 05/07/2021   Hyperlipidemia 08/12/2020   Prediabetes 08/12/2020   Hypomagnesemia 08/12/2020   Mutation in BRIP1 gene 06/24/2020   Hypokalemia 06/17/2020   Dehydration     Colitis 06/16/2020   Port-A-Cath in place 03/11/2020   Malignant neoplasm of upper-inner quadrant of left breast in female, estrogen receptor positive (HCC) 02/25/2020   Vulvar cancer (HCC) 01/29/2020   Vulvar ulceration 01/23/2020   Essential hypertension 10/31/2019   Nausea and vomiting 12/27/2018   Seasonal allergic rhinitis 12/27/2018   History of ELISA positive for HSV 10/01/2018   Restless leg syndrome 04/01/2016   Nephrolithiasis 03/08/2016   Obstructive pyelonephritis 03/08/2016   Normocytic anemia 03/08/2016   Hidradenitis suppurativa of left axilla 11/03/2015   Loss of weight 09/08/2015   Neck pain on left side 10/22/2014   Genital warts 05/20/2014   GERD (gastroesophageal reflux disease) 12/26/2012   Functional dyspepsia 12/26/2012   Tobacco abuse 08/22/2012   Herpes simplex virus (HSV) infection 02/03/2007    PCP: Vicci Barnie NOVAK, MD  REFERRING PROVIDER: Vicci Barnie NOVAK, MD  REFERRING DIAG: [phone] (ICD-10-CM) - Weakness  of both hands   THERAPY DIAG:  Muscle weakness (generalized)  Left shoulder pain, unspecified chronicity  Rationale for Evaluation and Treatment: Rehabilitation  ONSET DATE: Chronic   SUBJECTIVE SUBJECTIVE STATEMENT: Pt presents to PT with reports of continued improvement, does have some R shoulder pain due to tetanus shot yesterday.   EVAL: Pt presents to PT with reports of chronic bilateral UE and hand weakness after multiple seizures and hospitalization last year. Her walking has improved but she notes continued decrease in her activity tolerance. 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 Does continue to have significant L shoulder pain and discomfort which has been present from first PT episode last year.    Hand dominance: Right  PERTINENT HISTORY: Seizures, HTN, Cancer  PAIN:  Are you having pain?  Yes: NPRS scale: 3/10 Worst:  8/10 Pain location: left shoulder Pain description: sharp, tight Aggravating factors: none Relieving factors: salonpas patches, heat  PRECAUTIONS: None  RED FLAGS: None   WEIGHT BEARING RESTRICTIONS: No  FALLS:  Has patient fallen in last 6 months? No  LIVING ENVIRONMENT: Lives with: lives with their family Lives in: House/apartment Stairs: No Has following equipment at home: Environmental consultant - 4 wheeled, bed side commode, and Grab bars  OCCUPATION: CNA - [employer]  PLOF: Independent  PATIENT GOALS: improve her UE strength and get back/improve her functional activity tolerance  NEXT MD VISIT: 10/02/2023   OBJECTIVE:  Note: Objective measures were completed at Evaluation unless otherwise noted. PATIENT SURVEYS :  FOTO: 45% function; 58% predicted   09/25/2023: 51%    SENSATION: Light touch: Impaired - L UE (hx of cervical radiculopathy)  POSTURE: Rounded shoulders, fwd head  UPPER EXTREMITY ROM:   Active ROM Right eval Left eval Left 09/25/2023  Shoulder flexion WFL 115 130  Shoulder extension     Shoulder abduction     Shoulder adduction     Shoulder internal rotation     Shoulder external rotation     Elbow flexion     Elbow extension     Wrist flexion     Wrist extension     Wrist ulnar deviation     Wrist radial deviation     Wrist pronation     Wrist supination     (Blank rows = not tested)  UPPER EXTREMITY MMT:  MMT Right eval Left eval Rt / Lt 09/25/2023  Shoulder flexion 4/5 3+/5 4 / 4-  Shoulder extension     Shoulder abduction 4/5 3+/5 4 / 4-  Shoulder adduction     Shoulder internal rotation 4/5 3+/5   Shoulder external rotation 4/5 3+/5 4 / 4-  Middle trapezius     Lower trapezius     Elbow flexion 4/5 3+/5 4 / 4-  Elbow extension 4/5 3+/5 4 / 4-  Wrist flexion     Wrist extension     Wrist ulnar deviation     Wrist radial deviation     Wrist pronation     Wrist supination     Grip strength (lbs) 60 45 58 / 40  (Blank  rows = not tested)  SHOULDER SPECIAL TESTS: Impingement tests: Painful arc test: positive - left SLAP lesions: DNT Instability tests: DNT Rotator cuff assessment: DNT Biceps assessment: DNT  JOINT MOBILITY TESTING:  DNT  PALPATION:  TTP to bilateral upper trap, L infraspinatus    TREATENT: [facility] Adult  PT Treatment:                                                DATE: 10/03/2023 Therapeutic Exercise: UBE L1 x 4 min (fwd/bwd) while taking subjective FM row 2x10 13# FM tricep ext 2x10 13# ER/IR 2x15 RTB each Supine horizontal abduction with GTB 2x15 Supine diagonals with GTB 2x10 each Semi-reclined shoulder flexion 2x10 2# each Seated bicep curl 2x15 4# each Yellow curl bar wrist flex/ext 2x30 Shoulder abd palms down 2x10 1# each Unilateral farmer's carry with 15# 2x60 ft each  [facility] Adult PT Treatment:                                                DATE: 09/25/2023 Therapeutic Exercise: UBE L1 x 4 min (fwd/bwd) while taking subjective Supine horizontal abduction with red 2 x 10 Supine diagonals with red 2 x 10 each Row with blue 2 x 15 Shoulder extension with green 2 x 10 ER/IR with red 2 x 10 each Standing tricep push down with FM 10# 2 x 15 Standing bicep curls with 3# 2 x 15 Standing shoulder scaption with 1# 2 x 10 Standing shoulder abduction palm down with 1# 2 x 10 Unilateral farmer's carry with 15# 2 x 60 ft each Therapeutic Activity: Reassessment of objective measures including MMT, ROM, FOTO, to address goals and submit for further insurance authorization  [facility] Adult PT Treatment:                                                DATE: 09/21/2023 Therapeutic Exercise: UBE L1 x 4 min (fwd/bwd) while taking subjective Row with green 2 x 15 Shoulder extension with green 2 x 10 Standing wall ball walk with physioball for shoulder elevation x 10 Supine shoulder flexion holding physionall 2 x 10 Supine horizontal abduction with red 2 x 10 Seated double ER and scap  retraction with red 2 x 10 Standing tricep push down with red 3 x 10 Standing bicep curls with 3# 3 x 10 Seated shoulder scaption with 1# 2 x 10 Seated shoulder abduction palm down with 1# 2 x 10  PATIENT EDUCATION: Education details: HEP Person educated: Patient Education method: Programmer, multimedia, Demonstration Education comprehension: verbalized understanding and returned demonstration  HOME EXERCISE PROGRAM: Access Code: [email or web address]   ASSESSMENT: CLINICAL IMPRESSION: Pt was able to complete all prescribed exercises with no adverse effect. Therapy today continued to progress UE strengthening and functional activity tolerance. She continues to benefit from skilled PT services, will continue to progress as able per POC.   EVAL: Patient is a 48 y.o. F who was seen today for physical therapy evaluation and treatment for bilateral UE weakness and decreased functional ability. Physical findings are consistent with timeline and MD impression as pt demonstrates decrease in UE strength and general functional ability. FOTO score demonstrates decrease in subjective functional ability below PLOF. 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   OBJECTIVE IMPAIRMENTS: Abnormal gait, decreased activity tolerance, decreased endurance, decreased mobility, difficulty  walking, decreased ROM, decreased strength, impaired UE functional use, postural dysfunction, and pain   ACTIVITY LIMITATIONS: carrying, lifting, transfers, reach over head, hygiene/grooming, and caring for others  PARTICIPATION LIMITATIONS: meal prep, cleaning, laundry, driving, shopping, community activity, occupation, and yard work  PERSONAL FACTORS: Time since onset of injury/illness/exacerbation and 3+ comorbidities: Seizures, HTN, Cancer are also affecting patient's functional outcome.    GOALS: Goals reviewed with patient? No  SHORT TERM GOALS: Target date:  09/29/2023   Pt will be compliant and knowledgeable with initial HEP for improved comfort and carryover Baseline: initial HEP given  09/25/2023: progressing Goal status: ONGOING  2.  Pt will self report left shoulder pain no greater than 5/10 for improved comfort and functional ability Baseline: 8/10 at worst 09/25/2023: 8/10 at worst Goal status: ONGOING  LONG TERM GOALS: Target date: 11/03/2023   Pt will improve FOTO function score to no less than 58% as proxy for functional improvement for improved functional ability with work and home ADLs Baseline: 45% function 09/25/2023: 51% Goal status: ONGOING  2.  Pt will self report left shoulder pain no greater than 1-2/10 for improved comfort and functional ability Baseline: 8/10 at worst 09/25/2023: 8/10 at worst Goal status: ONGOING  3.  Pt will improve bilateral UE MMT to no less than 4+/5 for improved comfort and return to work ability as CNA Baseline: see MMT chart 09/25/2023: left UE strength grossly 4-/5 MMT, right UE strength grossly 4/5 MMT Goal status: ONGOING  4.  Pt will improve bilateral grip strength to no less than 75lbs for return to age-related normative value and improved functional ability Baseline: see strength chart 09/25/2023: right 58 lbs, left 40 lbs Goal status: ONGOING  5.  Pt will be able to perform >30 minutes of standing activity without need for rest break in order to improve functional activity tolerance towards return to work Baseline: 10 minutes 09/25/2023:  Goal status: ONGOING   PLAN: PT FREQUENCY: 2x/week  PT DURATION: 8 weeks  PLANNED INTERVENTIONS: 97164- PT Re-evaluation, 97110-Therapeutic exercises, 97530- Therapeutic activity, 97112- Neuromuscular re-education, 97535- Self Care, [id number]- Manual therapy, 97014- Electrical stimulation (unattended), [id number]- Electrical stimulation (manual), 97016- Vasopneumatic device, Dry Needling, Cryotherapy, and Moist heat  PLAN FOR NEXT SESSION: assess HEP  response, global UE strengthening    Alm JAYSON Kingdom PT  10/03/23 2:03 PM

## 2023-10-03 NOTE — Progress Notes (Signed)
Virtual Visit via Video Note   I connected with Erika C. Galicia on 10/03/23 at  9:00 AM EDT by a video enabled telemedicine application and verified that I am speaking with the correct person using two identifiers.   At orientation to the IOP program, Case Manager discussed the limitations of evaluation and management by telemedicine and the availability of in person appointments. The patient expressed understanding and agreed to proceed with virtual visits throughout the duration of the program.   Location:  Patient: Patient Home Provider: [facility]   History of Present Illness: Bipolar I Disorder and GAD   Observations/Objective: Check In: Case Manager checked in with all participants to review discharge dates, insurance authorizations, work-related documents and needs from the treatment team regarding medications. Chloeanne stated needs and engaged in discussion.    Initial Therapeutic Activity: Counselor facilitated a check-in with Amira to assess for safety, sobriety and medication compliance.  Counselor also inquired about Liylah's current emotional ratings, as well as any significant changes in thoughts, feelings or behavior since previous check in.  Kynley presented for session on time and was alert, oriented x5, with no evidence or self-report of active SI/HI or A/V H.  Fahima reported compliance with medication and denied use of alcohol or illicit substances.  Adamary reported scores of 10/10 for depression, 8/10 for anxiety, and 0/10 for anger/irritability.  Karee denied any recent outbursts or panic attacks.  Thomasine reported that a recent success was meeting with her doctor yesterday afternoon, and being approved for extended leave from her job, stating "That was a relief".  Nieve reported that a struggle was having to wait longer than planned to be seen by the doctor.  Navy reported that her goal today is to attend a physical therapy appointment, although she is nervous  about driving down [address] since she will be driving alone.         Second Therapeutic Activity: Counselor introduced [employer], [place] Chaplain to provide psychoeducation on topic of Grief and Loss with members today.  Marchelle Folks began discussion by checking in with the group about their baseline mood today, general thoughts on what grief means to them and how it has affected them personally in the past.  Marchelle Folks provided information on how the process of grief/loss can differ depending upon one's unique culture, and categories of loss one could experience (i.e. loss of a person, animal, relationship, job, identity, etc).  Marchelle Folks encouraged members to be mindful of how pervasive loss can be, and how to recognize signs which could indicate that this is having an impact on one's overall mental health and wellbeing.  Intervention was effective, as evidenced by [employer] participating in discussion with speaker on the subject, reporting that she lost several people in her life, but the hardest experience was loss of a parent.  Valoree stated "It wasn't until I lost my dad that I experienced grief really bad".  She reported that most recently she lost a cousin over the weekend, and stated "I don't really know how to work through that right now". Shadaya was receptive to feedback from chaplain on ways to process grief and loss in healthy manner.   Third Therapeutic Activity: Counselor introduced topic of creating mental health maintenance plan today.  Counselor provided handout on subject to members, which stressed the importance of maintaining one's mental health in a similar way to using diet and exercise to ensure physical health.  Counselor walked members through process of identifying triggers which could  worsen symptoms, including specific people, places, and things one needs to avoid.  Members were also tasked with identifying warning signs such as thoughts, feelings, or behaviors which could indicate mental  health is at increased risk.  Counselor also facilitated conversation on self-care activities and coping strategies which members have previously utilized in the past, are currently using in daily routine, or plan to use soon to assist with managing problems or symptoms when/if they appear.  Counselor encouraged members to revisit their maintenance plan often and make changes as needed to ensure day to day stability.  Intervention was effective, as evidenced by [employer] participating in activity and creating a comprehensive plan, including identification of triggers such as driving on a busy road, going to the cancer center, her workplace, or the grocery store, and warning signs including having crying spells, getting less sleep at night, or isolating from others.  Aylanie also reported that she would make an effort to set aside time for self-care activities such as getting out of the house more often to socialize with family or friends for movie, bingo, or dinner nights and use coping skills such as breathing exercises or taking a timeout to manage stressors.     Assessment and Plan: Counselor recommends that Shane remain in IOP treatment to better manage mental health symptoms, ensure stability and pursue completion of treatment plan goals. Counselor recommends adherence to crisis/safety plan, taking medications as prescribed, and following up with medical professionals if any issues arise.    Follow Up Instructions: Counselor will send Webex link for session tomorrow.  Halli was advised to call back or seek an in-person evaluation if the symptoms worsen or if the condition fails to improve as anticipated.   Collaboration of Care:   Medication Management AEB Hillery Jacks, NP or Dr. Park Pope                                          Case Manager AEB Jeri Modena, CNA    Patient/Guardian was advised Release of Information must be obtained prior to any record release in order to collaborate their care  with an outside provider. Patient/Guardian was advised if they have not already done so to contact the registration department to sign all necessary forms in order for [place] to release information regarding their care.    Consent: Patient/Guardian gives verbal consent for treatment and assignment of benefits for services provided during this visit. Patient/Guardian expressed understanding and agreed to proceed.   I provided 180 minutes of non-face-to-face time during this encounter.   Noralee Stain, [place], LCAS 10/03/23

## 2023-10-04 ENCOUNTER — Other Ambulatory Visit (HOSPITAL_COMMUNITY): Payer: BC Managed Care – PPO | Admitting: Psychiatry

## 2023-10-04 ENCOUNTER — Telehealth (HOSPITAL_COMMUNITY): Payer: Self-pay | Admitting: Psychiatry

## 2023-10-04 ENCOUNTER — Encounter: Payer: Self-pay | Admitting: Internal Medicine

## 2023-10-04 NOTE — Telephone Encounter (Signed)
D:  Pt sent the MH-IOP case manager an email this morning stating she would be missing group d/t illness (ie. Diarrhea and vomiting).  "I've been up since 3 a.m.  My mom has been experiencing the same symptoms for the last four days and now I've caught whatever she has."  A:  Inform treatment team; along with Dr. Lolly Mustache.

## 2023-10-05 ENCOUNTER — Ambulatory Visit: Payer: BC Managed Care – PPO | Admitting: Physical Therapy

## 2023-10-05 ENCOUNTER — Other Ambulatory Visit (HOSPITAL_COMMUNITY): Payer: BC Managed Care – PPO

## 2023-10-05 ENCOUNTER — Telehealth (HOSPITAL_COMMUNITY): Payer: Self-pay | Admitting: Psychiatry

## 2023-10-05 NOTE — Telephone Encounter (Signed)
D:  Pt sent MH-IOP Case Mgr an email inquiring if she could not have her camera on tomorrow d/t continued illness (ie. Vomiting and diarrhea).  Pt informed cm that she hasn't started the Zoloft yet d/t being ill.  A:  Provided pt with support.  Informed pt that she could pass with having her camera on since she is still ill.  Inform treatment team and Dr. Lolly Mustache.  R:  Pt receptive.

## 2023-10-05 NOTE — Telephone Encounter (Signed)
Last office notes has been faxed.

## 2023-10-06 ENCOUNTER — Other Ambulatory Visit (HOSPITAL_COMMUNITY): Payer: BC Managed Care – PPO | Admitting: Licensed Clinical Social Worker

## 2023-10-06 DIAGNOSIS — F319 Bipolar disorder, unspecified: Secondary | ICD-10-CM

## 2023-10-06 DIAGNOSIS — F411 Generalized anxiety disorder: Secondary | ICD-10-CM
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
## 2023-10-07 ENCOUNTER — Encounter: Payer: Self-pay | Admitting: Hematology and Oncology

## 2023-10-08 ENCOUNTER — Other Ambulatory Visit: Payer: Self-pay | Admitting: Physician Assistant

## 2023-10-08 DIAGNOSIS — R1013 Epigastric pain: Secondary | ICD-10-CM

## 2023-10-09 ENCOUNTER — Encounter (HOSPITAL_COMMUNITY): Payer: Self-pay

## 2023-10-09 ENCOUNTER — Other Ambulatory Visit (HOSPITAL_COMMUNITY): Payer: BC Managed Care – PPO | Attending: Psychiatry | Admitting: Licensed Clinical Social Worker

## 2023-10-09 ENCOUNTER — Encounter: Payer: BC Managed Care – PPO | Admitting: Physical Therapy

## 2023-10-09 ENCOUNTER — Encounter: Payer: Self-pay | Admitting: Hematology and Oncology

## 2023-10-09 ENCOUNTER — Other Ambulatory Visit: Payer: Self-pay

## 2023-10-09 ENCOUNTER — Other Ambulatory Visit (HOSPITAL_COMMUNITY): Payer: Self-pay

## 2023-10-09 DIAGNOSIS — E0500 Thyrotoxicosis with diffuse goiter without thyrotoxic crisis or storm: Secondary | ICD-10-CM | POA: Diagnosis not present

## 2023-10-09 DIAGNOSIS — Z853 Personal history of malignant neoplasm of breast: Secondary | ICD-10-CM | POA: Insufficient documentation

## 2023-10-09 DIAGNOSIS — F411 Generalized anxiety disorder: Secondary | ICD-10-CM | POA: Diagnosis present

## 2023-10-09 DIAGNOSIS — F319 Bipolar disorder, unspecified: Secondary | ICD-10-CM | POA: Insufficient documentation

## 2023-10-09 DIAGNOSIS — Z8544 Personal history of malignant neoplasm of other female genital organs: Secondary | ICD-10-CM | POA: Insufficient documentation

## 2023-10-09 DIAGNOSIS — F1011 Alcohol abuse, in remission: Secondary | ICD-10-CM | POA: Insufficient documentation

## 2023-10-09 MED ORDER — GABAPENTIN 300 MG PO CAPS
300.0000 mg | ORAL_CAPSULE | Freq: Every day | ORAL | 1 refills | Status: DC
  Filled 2023-10-09: qty 30, 30d supply, fill #0
  Filled 2023-11-09: qty 30, 30d supply, fill #1

## 2023-10-09 NOTE — Progress Notes (Signed)
Virtual Visit via Video Note   I connected with Sandra Miller on 10/09/23 at  9:00 AM EDT by a video enabled telemedicine application and verified that I am speaking with the correct person using two identifiers.   At orientation to the IOP program, Case Manager discussed the limitations of evaluation and management by telemedicine and the availability of in person appointments. The patient expressed understanding and agreed to proceed with virtual visits throughout the duration of the program.   Location:  Patient: Patient Home Provider: [facility]   History of Present Illness: Bipolar I Disorder and GAD   Observations/Objective: Check In: Case Manager checked in with all participants to review discharge dates, insurance authorizations, work-related documents and needs from the treatment team regarding medications. Sandra Miller stated needs and engaged in discussion.    Initial Therapeutic Activity: Counselor facilitated a check-in with Sandra Miller to assess for safety, sobriety and medication compliance.  Counselor also inquired about Sandra Miller's current emotional ratings, as well as any significant changes in thoughts, feelings or behavior since previous check in.  Sandra Miller presented for session on time and was alert, oriented x5, with no evidence or self-report of active SI/HI or A/V H.  Sandra Miller reported compliance with medication and denied use of alcohol or illicit substances.  Sandra Miller reported scores of 6/10 for depression, 7/10 for anxiety, and 0/10 for anger/irritability.  Sandra Miller denied any recent outbursts or panic attacks.  Sandra Miller reported that a struggle was spending a lot of time in her bedroom this weekend due to depression and feeling sick.  Sandra Miller reported that a recent success was making a nice dinner on Sunday for the family.  Sandra Miller reported that her goal today is to take care of a few things around the house if she can muster the energy, such as laundry.        Second Therapeutic  Activity: Counselor invited members to participate in peaceful place guided imagery activity today.  Counselor explained how this is a powerful visualization tool which can aid in reducing stress while increasing sense of calm, control, and awareness if practiced regularly.  Counselor informed members beforehand that if they became uncomfortable at any point during activity, they could stop and open their eyes.  Counselor invited members to get comfortable, achieve a relaxing breathing rhythm, close their eyes, and then guided them through process of creating a 'peaceful place' which filled them with safety and calm.  Counselor encouraged members to include sensory details involving vision, sound, touch, smell, and taste which they considered pleasant to enhance experience.  After practicing in session, counselor invited members to share their opinion on the activity, including whether they were able to imagine a specific place, what details stood out to them, and how this made them feel during and after.  Intervention effectiveness could not be measured, as client participated in exercise, but did not report about their overall experience or whether they would try this again on their own.    Assessment and Plan: Counselor recommends that Sandra Miller remain in IOP treatment to better manage mental health symptoms, ensure stability and pursue completion of treatment plan goals. Counselor recommends adherence to crisis/safety plan, taking medications as prescribed, and following up with medical professionals if any issues arise.    Follow Up Instructions: Counselor will send Webex link for session tomorrow.  Sandra Miller was advised to call back or seek an in-person evaluation if the symptoms worsen or if the condition fails to improve as anticipated.   Collaboration of  Care:   Medication Management AEB Hillery Jacks, NP or Dr. Park Pope                                          Case Manager AEB Jeri Modena, CNA     Patient/Guardian was advised Release of Information must be obtained prior to any record release in order to collaborate their care with an outside provider. Patient/Guardian was advised if they have not already done so to contact the registration department to sign all necessary forms in order for [place] to release information regarding their care.    Consent: Patient/Guardian gives verbal consent for treatment and assignment of benefits for services provided during this visit. Patient/Guardian expressed understanding and agreed to proceed.   I provided 180 minutes of non-face-to-face time during this encounter.   Noralee Stain, LCSW, LCAS 10/09/23

## 2023-10-10 ENCOUNTER — Other Ambulatory Visit (HOSPITAL_COMMUNITY): Payer: BC Managed Care – PPO | Admitting: Licensed Clinical Social Worker

## 2023-10-10 ENCOUNTER — Encounter: Payer: Self-pay | Admitting: Hematology and Oncology

## 2023-10-10 ENCOUNTER — Other Ambulatory Visit (HOSPITAL_COMMUNITY): Payer: Self-pay

## 2023-10-10 DIAGNOSIS — F319 Bipolar disorder, unspecified: Secondary | ICD-10-CM

## 2023-10-10 DIAGNOSIS — F411 Generalized anxiety disorder: Secondary | ICD-10-CM | POA: Diagnosis not present

## 2023-10-10 NOTE — Progress Notes (Signed)
 Virtual Visit via Video Note   I connected with Sandra Miller on 10/10/23 at  9:00 AM EDT by a video enabled telemedicine application and verified that I am speaking with the correct person using two identifiers.   At orientation to the IOP program, Case Manager discussed the limitations of evaluation and management by telemedicine and the availability of in person appointments. The patient expressed understanding and agreed to proceed with virtual visits throughout the duration of the program.   Location:  Patient: [facility] Home Provider: [facility]   History of Present Illness: Bipolar I Disorder and GAD   Observations/Objective: Check In: Case Manager checked in with all participants to review discharge dates, insurance authorizations, work-related documents and needs from the treatment team regarding medications. Sandra Miller stated needs and engaged in discussion.    Initial Therapeutic Activity: Counselor facilitated a check-in with Sandra Miller to assess for safety, sobriety and medication compliance.  Counselor also inquired about Sandra Miller's current emotional ratings, as well as any significant changes in thoughts, feelings or behavior since previous check in.  Sandra Miller presented for session on time and was alert, oriented x5, with no evidence or self-report of active SI/HI or A/V H.  Sandra Miller reported compliance with medication and denied use of alcohol or illicit substances.  Sandra Miller reported scores of 7/10 for depression, 7/10 for anxiety, and 0/10 for anger/irritability.  Sandra Miller denied any recent outbursts or panic attacks.  Sandra Miller reported that a struggle was lacking motivation yesterday after group, and not accomplishing any of her goals.  Sandra Miller stated "I think its because I'm depressed and have been doing a lot of overthinking".  Sandra Miller reported that she was able to accomplish some chores around the home this morning.  Sandra Miller reported that her goal today is to try to find something else to  do around the house to preoccupy her time.          Second Therapeutic Activity: Counselor introduced Sandra Miller, [place] Chaplain to provide psychoeducation on topic of Grief and Loss with members today.  Sandra Miller began discussion by checking in with the group about their baseline mood today, general thoughts on what grief means to them and how it has affected them personally in the past.  Sandra Miller provided information on how the process of grief/loss can differ depending upon one's unique culture, and categories of loss one could experience (i.e. loss of a person, animal, relationship, job, identity, etc).  Sandra Miller encouraged members to be mindful of how pervasive loss can be, and how to recognize signs which could indicate that this is having an impact on one's overall mental health and wellbeing.  Intervention effectiveness was mixed, as client did not participate aside from answering an icebreaker question posed at beginning by the chaplain.    Third Therapeutic Activity: Counselor provided demonstration of relaxation technique known as mindful breathing meditation to help members increase sense of calm, resiliency, and control.  Counselor guided members through process of getting comfortable, achieving a relaxed breathing rhythm, and focusing on this for several minutes, allowing troubling thoughts and feelings to come and go without rumination.  Counselor processed effectiveness of activity afterward in discussion with members, including how this impacted their mental state, whether it was difficult to stay focused, and if they plan to include it in self-care routine to improve day-to-day coping.  Intervention was effective, as evidenced by [employer] participating in exercise, and reporting that she found it to be relaxing, although she noticed increased racing thoughts at first  which interfered with focus.  Sandra Miller expressed interest in adding this practice to her self-care routine.     Assessment and  Plan: Counselor recommends that Sandra Miller remain in IOP treatment to better manage mental health symptoms, ensure stability and pursue completion of treatment plan goals. Counselor recommends adherence to crisis/safety plan, taking medications as prescribed, and following up with medical professionals if any issues arise.    Follow Up Instructions: Counselor will send Webex link for session tomorrow.  Sandra Miller was advised to call back or seek an in-person evaluation if the symptoms worsen or if the condition fails to improve as anticipated.   Collaboration of Care:   Medication Management AEB Staci Kerns, NP or Dr. Prentice Espy                                          Case Manager AEB Sandra Gaskins, CNA    Patient/Guardian was advised Release of Information must be obtained prior to any record release in order to collaborate their care with an outside provider. Patient/Guardian was advised if they have not already done so to contact the registration department to sign all necessary forms in order for us  to release information regarding their care.    Consent: Patient/Guardian gives verbal consent for treatment and assignment of benefits for services provided during this visit. Patient/Guardian expressed understanding and agreed to proceed.   I provided 180 minutes of non-face-to-face time during this encounter.   Sandra Ricker, LCSW, LCAS 10/10/23

## 2023-10-11 ENCOUNTER — Other Ambulatory Visit (HOSPITAL_COMMUNITY): Payer: BC Managed Care – PPO | Admitting: Licensed Clinical Social Worker

## 2023-10-11 ENCOUNTER — Other Ambulatory Visit (HOSPITAL_COMMUNITY): Payer: Self-pay

## 2023-10-11 DIAGNOSIS — F411 Generalized anxiety disorder: Secondary | ICD-10-CM | POA: Diagnosis not present

## 2023-10-11 DIAGNOSIS — F319 Bipolar disorder, unspecified: Secondary | ICD-10-CM

## 2023-10-11 NOTE — Progress Notes (Signed)
 Virtual Visit via Video Note   I connected with Asiana C. Bralley on 10/11/23 at  9:00 AM EDT by a video enabled telemedicine application and verified that I am speaking with the correct person using two identifiers.   At orientation to the IOP program, Case Manager discussed the limitations of evaluation and management by telemedicine and the availability of in person appointments. The patient expressed understanding and agreed to proceed with virtual visits throughout the duration of the program.   Location:  Patient: [facility] Home Provider: [facility]   History of Present Illness: Bipolar I Disorder and GAD   Observations/Objective: Check In: Case Manager checked in with all participants to review discharge dates, insurance authorizations, work-related documents and needs from the treatment team regarding medications. Jessy stated needs and engaged in discussion.    Initial Therapeutic Activity: Counselor facilitated a check-in with Najai to assess for safety, sobriety and medication compliance.  Counselor also inquired about Margurette's current emotional ratings, as well as any significant changes in thoughts, feelings or behavior since previous check in.  Iretha presented for session on time and was alert, oriented x5, with no evidence or self-report of active SI/HI or A/V H.  Venola reported compliance with medication and denied use of alcohol or illicit substances.  Dawana reported scores of 7/10 for depression, 7/10 for anxiety, and 0/10 for anger/irritability.  Edith denied any recent outbursts or panic attacks.  Cerena reported that a recent success was listening to a podcast yesterday for self-care after group.  Briggett reported that a struggle was finding out that her granddaughter has become sick, so she requested to leave group at break time in order to watch her and determine whether they need to take her back to the doctor or not.  Ahleah reported that her goal today is to  watch her granddaughter and change the sheets on her bed.         Second Therapeutic Activity: Counselor introduced Buel Cedar, [facility] Pharmacist, to provide psychoeducation on topic of medication compliance with members today.  Buel provided psychoeducation on classes of medications such as antidepressants, antipsychotics, what symptoms they are intended to treat, and any side effects one might encounter while on a particular prescription.  Time was allowed for clients to ask any questions they might have of [facility] regarding this specialty.  Intervention was effective, as evidenced by [employer] participating in discussion with speaker on the subject, reporting that recently she had issues with her medication regimen, as she was on Zyprexa  for years, but was taken off due to EKG results.  Meena reported that she was then started on Lamictal  by her psychiatrist, but was unsure what it was intended to do for her.  Porshia was receptive to feedback from pharmacist on purpose of this medication, potential benefits, and side effects to watch for.    Third Therapeutic Activity: Counselor introduced topic of mental illness stigma today.  Counselor explained how stigma is defined as someone viewing another person in a negative way due to distinguishing characteristics or traits which are thought to be a disadvantage, including negative beliefs or attitudes associated with people who have a mental health condition such as major depression and/or generalized anxiety.  Counselor explained that stigma can lead to discrimination as well, and the harmful effects of pervasive stigma include reluctance to seek help or treatment, lack of understanding by family, friends, co-workers or peers, bullying/harassment, or negative impact on one's perceived ability to overcome challenges or succeed in life.  Counselor offered several suggestions for coping with stigma regarding mental illness, including staying engaged in treatment  through linkage with a therapist, psychiatrist, and/or support group, avoiding isolation, and speaking out against stigma when encountered to reverse societal impact and increase acceptance and understanding.  Counselor inquired about members' experiences with this issue, as well as what they have done to minimize the impact it can have on their lives.  Yuritza was unable to participate in this activity due to leaving group at break time to assist family.    Assessment and Plan: Counselor recommends that Oralee remain in IOP treatment to better manage mental health symptoms, ensure stability and pursue completion of treatment plan goals. Counselor recommends adherence to crisis/safety plan, taking medications as prescribed, and following up with medical professionals if any issues arise.    Follow Up Instructions: Counselor will send Webex link for session tomorrow.  Liani was advised to call back or seek an in-person evaluation if the symptoms worsen or if the condition fails to improve as anticipated.   Collaboration of Care:   Medication Management AEB Staci Kerns, NP or Dr. Prentice Espy                                          Case Manager AEB Ricka Gaskins, CNA    Patient/Guardian was advised Release of Information must be obtained prior to any record release in order to collaborate their care with an outside provider. Patient/Guardian was advised if they have not already done so to contact the registration department to sign all necessary forms in order for us  to release information regarding their care.    Consent: Patient/Guardian gives verbal consent for treatment and assignment of benefits for services provided during this visit. Patient/Guardian expressed understanding and agreed to proceed.   I provided 90 minutes of non-face-to-face time during this encounter.   Darleene Ricker, LCSW, LCAS 10/11/23

## 2023-10-12 ENCOUNTER — Inpatient Hospital Stay: Payer: No Typology Code available for payment source | Attending: Hematology and Oncology

## 2023-10-12 ENCOUNTER — Other Ambulatory Visit (HOSPITAL_COMMUNITY): Payer: BC Managed Care – PPO | Admitting: Licensed Clinical Social Worker

## 2023-10-12 DIAGNOSIS — F319 Bipolar disorder, unspecified: Secondary | ICD-10-CM

## 2023-10-12 DIAGNOSIS — F411 Generalized anxiety disorder: Secondary | ICD-10-CM

## 2023-10-12 NOTE — Progress Notes (Signed)
 Virtual Visit via Video Note   I connected with Lonnette C. Coston on 10/12/23 at  9:00 AM EDT by a video enabled telemedicine application and verified that I am speaking with the correct person using two identifiers.   At orientation to the IOP program, Case Manager discussed the limitations of evaluation and management by telemedicine and the availability of in person appointments. The patient expressed understanding and agreed to proceed with virtual visits throughout the duration of the program.   Location:  Patient: Patient Home Provider: Home Office   History of Present Illness: Bipolar I Disorder and GAD   Observations/Objective: Check In: Case Manager checked in with all participants to review discharge dates, insurance authorizations, work-related documents and needs from the treatment team regarding medications. Corrinne stated needs and engaged in discussion.    Initial Therapeutic Activity: Counselor facilitated a check-in with Nefertari to assess for safety, sobriety and medication compliance.  Counselor also inquired about Victoria's current emotional ratings, as well as any significant changes in thoughts, feelings or behavior since previous check in.  Emmalina presented for session on time and was alert, oriented x5, with no evidence or self-report of active SI/HI or A/V H.  Korena reported compliance with medication and denied use of alcohol or illicit substances.  Bernell reported scores of 6/10 for depression, 6/10 for anxiety, and 0/10 for anger/irritability.  Bryona denied any recent outbursts or panic attacks.  Toriann reported that a struggle was experiencing a migraine yesterday after group, which required her to lay down to rest for awhile.  Emberlynn reported that a success was assisting her daughter with watching the granddaughter when she did start feeling better.  Shuree reported that her goal today is to attend an appointment at the cancer center in the afternoon, stating "That  will probably take a lot out of me".          Second Therapeutic Activity: Counselor introduced topic of self-care today.  Counselor explained how this can be defined as the things one does to maintain good health and improve well-being.  Counselor provided members with a self-care assessment form to complete.  This handout featured various sub-categories of self-care, including physical, psychological/emotional, social, spiritual, and professional.  Members were asked to rank their engagement in the activities listed for each dimension on a scale of 1-3, with 1 indicating 'Poor', 2 indicating 'Ok', and 3 indicating 'Well'.  Counselor invited members to share results of their assessment, and inquired about which areas of self-care they are doing well in, as well as areas that require attention, and how they plan to begin addressing this during treatment.  Intervention was effective, as evidenced by Finesse successfully completing initial 2 sections of assessment and actively engaging in discussion on subject, reporting that she is excelling in areas such as eating regularly, resting when sick, taking time off from work, school, and other obligations, but would benefit from focusing more on areas such as eating healthy foods, exercise, participating in fun activities, getting enough sleep, taking care of personal hygiene, going to preventative medical appointments, expressing feelings in a healthy way, and talking about her problems.  Vicy reported that she would work to improve self-care deficits by adjusting diet to reduce overall sodium and sugar intake, prioritizing hygiene by fixing her hair and doing skincare more often, implementing sleep hygiene techniques to get more rest at night, taking walks outside of the house more frequently for exercise, making an upcoming appointment with the dentist, checking in with her  providers and children about her feelings for support and feedback when struggling, and  giving herself more credit for accomplishments day to day during treatment.    Assessment and Plan: Counselor recommends that Tyffani remain in IOP treatment to better manage mental health symptoms, ensure stability and pursue completion of treatment plan goals. Counselor recommends adherence to crisis/safety plan, taking medications as prescribed, and following up with medical professionals if any issues arise.    Follow Up Instructions: Counselor will send Webex link for session tomorrow.  Nyasha was advised to call back or seek an in-person evaluation if the symptoms worsen or if the condition fails to improve as anticipated.   Collaboration of Care:   Medication Management AEB Staci Kerns, NP or Dr. Prentice Espy                                          Case Manager AEB Ricka Gaskins, CNA    Patient/Guardian was advised Release of Information must be obtained prior to any record release in order to collaborate their care with an outside provider. Patient/Guardian was advised if they have not already done so to contact the registration department to sign all necessary forms in order for us  to release information regarding their care.    Consent: Patient/Guardian gives verbal consent for treatment and assignment of benefits for services provided during this visit. Patient/Guardian expressed understanding and agreed to proceed.   I provided 180 minutes of non-face-to-face time during this encounter.   Darleene Ricker, LCSW, LCAS 10/12/23

## 2023-10-13 ENCOUNTER — Other Ambulatory Visit (HOSPITAL_COMMUNITY): Payer: BC Managed Care – PPO | Admitting: Psychiatry

## 2023-10-13 DIAGNOSIS — F411 Generalized anxiety disorder: Secondary | ICD-10-CM

## 2023-10-13 DIAGNOSIS — F319 Bipolar disorder, unspecified: Secondary | ICD-10-CM

## 2023-10-13 NOTE — Progress Notes (Signed)
 Virtual Visit via Video Note   I connected with Sandra Miller on 10/13/23 at  9:00 AM EDT by a video enabled telemedicine application and verified that I am speaking with the correct person using two identifiers.   At orientation to the IOP program, Case Manager discussed the limitations of evaluation and management by telemedicine and the availability of in person appointments. The patient expressed understanding and agreed to proceed with virtual visits throughout the duration of the program.   Location:  Patient: [facility] Home Provider: [facility]   History of Present Illness: Bipolar I Disorder and GAD   Observations/Objective: Check In: Case Manager checked in with all participants to review discharge dates, insurance authorizations, work-related documents and needs from the treatment team regarding medications. Sandra Miller stated needs and engaged in discussion.    Initial Therapeutic Activity: Counselor facilitated a check-in with Sandra Miller to assess for safety, sobriety and medication compliance.  Counselor also inquired about Sandra Miller's current emotional ratings, as well as any significant changes in thoughts, feelings or behavior since previous check in.  Sandra Miller presented for session on time and was alert, oriented x5, with no evidence or self-report of active SI/HI or A/V H.  Sandra Miller reported compliance with medication and denied use of alcohol or illicit substances.  Sandra Miller reported scores of 6/10 for depression, 6/10 for anxiety, and 0/10 for anger/irritability.  Sandra Miller denied any recent outbursts or panic attacks.  Sandra Miller reported that a struggle was feeling panicky this morning when she was hyper focused on physical health, including a migraine that woke her up early.  Sandra Miller reported that a success was buying a journal yesterday as a new form of self-care to explore when she is feeling stressed.  Sandra Miller reported that her goal this weekend is to go to a museum with family.          Second Therapeutic Activity: Counselor introduced topic of building a social support network today.  Counselor explained how this can be defined as having a having a group of healthy people in one's life you can talk to, spend time with, and get help from to improve both mental and physical health.  Counselor noted that some barriers can make it difficult to connect with other people, including the presence of anxiety or depression, or moving to an unfamiliar area.  Group members were asked to assess the current state of their support network, and identify ways that this could be improved.  Tips were given on how to address previously noted barriers, such as strengthening social skills, using relaxation techniques to reduce anxiety, scheduling social time each week, and/or exploring social events nearby which could increase chances of meeting new supports.  Members were also encouraged to consider getting closer to people they already know through suggestions such as outreaching someone by text, email or phone call if they haven't spoken in awhile, doing something nice for a friend/family member unexpectedly, and/or inviting someone over for a game/movie/dinner night.  Intervention was effective, as evidenced by Sandra Miller actively participating in discussion on the subject, and stating "I have a pretty good network as far as my 2 kids, my mother, sister, and 2 brother".  She reported that when she felt very ill last year, she stopped talking to many friends and became isolated at home.  Sandra Miller reported that she has had friends trying to get back in touch, but depression has been a barrier in returning calls.  She reported that her goal will be to get out  of the house more often and reduce social anxiety by practicing relaxation techniques like deep breathing.  Sandra Miller expressed interest in making new connections through joining a book club or returning to her church.   Assessment and Plan: Counselor recommends  that Sandra Miller remain in IOP treatment to better manage mental health symptoms, ensure stability and pursue completion of treatment plan goals. Counselor recommends adherence to crisis/safety plan, taking medications as prescribed, and following up with medical professionals if any issues arise.    Follow Up Instructions: Counselor will send Webex link for session tomorrow.  Sandra Miller was advised to call back or seek an in-person evaluation if the symptoms worsen or if the condition fails to improve as anticipated.   Collaboration of Care:   Medication Management AEB Staci Kerns, NP or Dr. Prentice Espy                                          Case Manager AEB Ricka Gaskins, CNA    Patient/Guardian was advised Release of Information must be obtained prior to any record release in order to collaborate their care with an outside provider. Patient/Guardian was advised if they have not already done so to contact the registration department to sign all necessary forms in order for us  to release information regarding their care.    Consent: Patient/Guardian gives verbal consent for treatment and assignment of benefits for services provided during this visit. Patient/Guardian expressed understanding and agreed to proceed.   I provided 180 minutes of non-face-to-face time during this encounter.   Sandra Ricker, LCSW, LCAS 10/13/23

## 2023-10-16 ENCOUNTER — Other Ambulatory Visit: Payer: Self-pay

## 2023-10-16 ENCOUNTER — Other Ambulatory Visit (HOSPITAL_COMMUNITY): Payer: Self-pay

## 2023-10-16 ENCOUNTER — Telehealth: Payer: Self-pay

## 2023-10-16 ENCOUNTER — Other Ambulatory Visit (HOSPITAL_COMMUNITY): Payer: BC Managed Care – PPO | Admitting: Licensed Clinical Social Worker

## 2023-10-16 DIAGNOSIS — R112 Nausea with vomiting, unspecified: Secondary | ICD-10-CM

## 2023-10-16 DIAGNOSIS — R933 Abnormal findings on diagnostic imaging of other parts of digestive tract: Secondary | ICD-10-CM

## 2023-10-16 DIAGNOSIS — F411 Generalized anxiety disorder: Secondary | ICD-10-CM

## 2023-10-16 DIAGNOSIS — F319 Bipolar disorder, unspecified: Secondary | ICD-10-CM

## 2023-10-16 NOTE — Progress Notes (Signed)
 Virtual Visit via Video Note   I connected with Sandra Miller on 10/16/23 at  9:00 AM EDT by a video enabled telemedicine application and verified that I am speaking with the correct person using two identifiers.   At orientation to the IOP program, Case Manager discussed the limitations of evaluation and management by telemedicine and the availability of in person appointments. The patient expressed understanding and agreed to proceed with virtual visits throughout the duration of the program.   Location:  Patient: [facility] Home Provider: [facility]   History of Present Illness: Bipolar I Disorder and GAD   Observations/Objective: Check In: Case Manager checked in with all participants to review discharge dates, insurance authorizations, work-related documents and needs from the treatment team regarding medications. Sandra Miller stated needs and engaged in discussion.    Initial Therapeutic Activity: Counselor facilitated a check-in with Sandra Miller to assess for safety, sobriety and medication compliance.  Counselor also inquired about Sandra Miller's current emotional ratings, as well as any significant changes in thoughts, feelings or behavior since previous check in.  Sandra Miller presented for session on time and was alert, oriented x5, with no evidence or self-report of active SI/HI or A/V H.  Sandra Miller reported compliance with medication and denied use of alcohol or illicit substances.  Sandra Miller reported scores of 10/10 for depression, 10/10 for anxiety, and 0/10 for anger/irritability.  Sandra Miller denied any recent outbursts or panic attacks.  Sandra Miller reported that a struggle was waking up on Saturday experiencing pain, stating "It felt like I had a kidney stone and I just cried all day.  Its like I'm always sick with something".  Sandra Miller reported that this led her to feel more depressed as a result.  Sandra Miller reported that a success was waking up today and pushing herself to make coffee, and begin prep work for  dinner later before she joined group.  Sandra Miller reported that her goal today is to finish preparing dinner after she gets out of group, and take time to rest if pain remains constant.  She reported that she has an upcoming appointment with a urologist to address kidney pain, but will seek urgent care if condition worsens.          Second Therapeutic Activity: Counselor introduced topic of anger management today.  Counselor virtually shared a handout with members on this subject featuring a variety of coping skills, and facilitated discussion on these approaches.  Examples included raising awareness of anger triggers, practicing deep breathing, keeping an anger log to better understand episodes, using diversion activities to distract oneself for 30 minutes, taking a time out when necessary, and being mindful of warning signs tied to thoughts or behavior.  Counselor inquired about which techniques group members have used before, what has proved to be helpful, what their unique warning signs might be, as well as what they will try out in the future to assist with de-escalation.  Intervention was effective, as evidenced by Sandra Miller participating in discussion on activity, and reporting that she has taken anger management classes in past, as she could escalate quickly when upset.  Sandra Miller reported that since she is planning to go to work later this month, she is motivated to strengthen her anger management plan to avoid conflict in the workplace.  Sandra Miller reported that her triggers include fear about her physical health, having to follow orders, feeling helpless or out of control, work conflict, and when people don't work equally hard.  Sandra Miller reported that warning signs include increased heartrate,  high blood pressure, tension, outbursts, homicidal ideation, headaches, and sweating.  Sandra Miller reported that she will work to manage anger more effectively by using coping skills such as keeping an anger journal, speaking to  a supportive family member, and deep breathing.   Assessment and Plan: Counselor recommends that Sandra Miller remain in IOP treatment to better manage mental health symptoms, ensure stability and pursue completion of treatment plan goals. Counselor recommends adherence to crisis/safety plan, taking medications as prescribed, and following up with medical professionals if any issues arise.    Follow Up Instructions: Counselor will send Webex link for session tomorrow.  Sandra Miller was advised to call back or seek an in-person evaluation if the symptoms worsen or if the condition fails to improve as anticipated.   Collaboration of Care:   Medication Management AEB Dan Dun, NP or Dr. Augusta Blizzard                                          Case Manager AEB Molinda Angelica, CNA    Patient/Guardian was advised Release of Information must be obtained prior to any record release in order to collaborate their care with an outside provider. Patient/Guardian was advised if they have not already done so to contact the registration department to sign all necessary forms in order for us  to release information regarding their care.    Consent: Patient/Guardian gives verbal consent for treatment and assignment of benefits for services provided during this visit. Patient/Guardian expressed understanding and agreed to proceed.   I provided 180 minutes of non-face-to-face time during this encounter.   Desmond Florida, LCSW, LCAS 10/16/23

## 2023-10-16 NOTE — Telephone Encounter (Signed)
 I spoke with Sandra Miller and she is following up with Dr Carson Clara on 11/01/2023. Then she will let us  know what he says. She said they sent her a message about her results being okay. I will send a separate cardiac clearance letter.

## 2023-10-16 NOTE — Telephone Encounter (Signed)
-----   Message from Loy Ruff sent at 10/13/2023  2:12 PM EST ----- Regarding: cardiac f/u Please ask this lady about going [place] to cardiology - she was supposed to f/u them after last visit there and get cleared for EGD and colonoscopy - does not look like she had f/u after she did rhythm/event monitor?  She probably needs to get an appointment with them and then we can sort it out for GI

## 2023-10-17 ENCOUNTER — Ambulatory Visit: Payer: Self-pay | Admitting: Physical Therapy

## 2023-10-17 ENCOUNTER — Encounter: Payer: Self-pay | Admitting: Hematology and Oncology

## 2023-10-17 ENCOUNTER — Other Ambulatory Visit: Payer: Self-pay

## 2023-10-17 ENCOUNTER — Other Ambulatory Visit (HOSPITAL_COMMUNITY): Payer: Medicaid Other | Admitting: Licensed Clinical Social Worker

## 2023-10-17 DIAGNOSIS — F319 Bipolar disorder, unspecified: Secondary | ICD-10-CM

## 2023-10-17 DIAGNOSIS — F411 Generalized anxiety disorder: Secondary | ICD-10-CM

## 2023-10-17 NOTE — Progress Notes (Signed)
Virtual Visit via Video Note   I connected with Abriel C. Racanelli on 10/17/23 at  9:00 AM EDT by a video enabled telemedicine application and verified that I am speaking with the correct person using two identifiers.   At orientation to the IOP program, Case Manager discussed the limitations of evaluation and management by telemedicine and the availability of in person appointments. The patient expressed understanding and agreed to proceed with virtual visits throughout the duration of the program.   Location:  Patient: Patient Home Provider: [facility]   History of Present Illness: Bipolar I Disorder and GAD   Observations/Objective: Check In: Case Manager checked in with all participants to review discharge dates, insurance authorizations, work-related documents and needs from the treatment team regarding medications. Farida stated needs and engaged in discussion.    Initial Therapeutic Activity: Counselor facilitated a check-in with Maddisen to assess for safety, sobriety and medication compliance.  Counselor also inquired about Maurina's current emotional ratings, as well as any significant changes in thoughts, feelings or behavior since previous check in.  Laquanta presented for session on time and was alert, oriented x5, with no evidence or self-report of active SI/HI or A/V H.  Lada reported compliance with medication and denied use of alcohol or illicit substances.  Dorothea reported scores of 7/10 for depression, 7/10 for anxiety, and 0/10 for anger/irritability.  Nikala denied any recent outbursts or panic attacks.  Alianis reported that a struggle was experiencing ongoing pain yesterday, which led her to stay in bed much of the day.  Jersi reported that a success was finishing dinner for her family, who appeared to enjoy it.  Claudell reported that her goal today is to continue resting as she copes with physical pain.  Counselor encouraged her to keep her support network updated on  condition, including PCP, and seek medical attention if condition worsens.  Francely was agreeable to this.     Second Therapeutic Activity: Counselor covered topic of core beliefs with group today.  Counselor virtually shared a handout on the subject, which explained how everyone looks at the world differently, and two people can have the same experience, but have different interpretations of what happened.  Members were encouraged to think of these like sunglasses with different "shades" influencing perception towards positive or negative outcomes.  Examples of negative core beliefs were provided, such as "I'm unlovable", "I'm not good enough", and "I'm a bad person".  Members were asked to share which one(s) they could relate to, and then identify evidence which contradicts these beliefs.  Counselor also provided psychoeducation on positive affirmations today.  Counselor explained how these are positive statements which can be spoken out loud or recited mentally to challenge negative thoughts and/or core beliefs to improve mood and outlook each day.  Counselor shared a comprehensive list of affirmations virtually to members with different categories, including ones for health, confidence, success, and happiness.  Counselor invited members to look through this list and identify any which resonated with them, and practice saying them out loud with sincerity.  Intervention was effective, as evidenced by Nalea successfully participating in discussion on the subject and reporting that she could relate to several negative core beliefs listed on the handout, such as "I will end up alone", "People can't be trusted", and "Nothing ever goes right".  Dorinne was able to successfully challenge the core belief "Nothing ever goes right" by listening evidence that contradicted it, reporting that she just got a new job, she was  able to make a nice meal for her family yesterday, and has been consistently showing up to group  despite illness she has faced.  Korine also reported that she liked several of the positive affirmations listed, such as "I get better every day in every way", "This too shall pass", and "I am open to all possibilities".    Assessment and Plan: Counselor recommends that Takari remain in IOP treatment to better manage mental health symptoms, ensure stability and pursue completion of treatment plan goals. Counselor recommends adherence to crisis/safety plan, taking medications as prescribed, and following up with medical professionals if any issues arise.    Follow Up Instructions: Counselor will send Webex link for session tomorrow.  Storm was advised to call back or seek an in-person evaluation if the symptoms worsen or if the condition fails to improve as anticipated.   Collaboration of Care:   Medication Management AEB Hillery Jacks, NP or Dr. Park Pope                                          Case Manager AEB Jeri Modena, CNA    Patient/Guardian was advised Release of Information must be obtained prior to any record release in order to collaborate their care with an outside provider. Patient/Guardian was advised if they have not already done so to contact the registration department to sign all necessary forms in order for [place] to release information regarding their care.    Consent: Patient/Guardian gives verbal consent for treatment and assignment of benefits for services provided during this visit. Patient/Guardian expressed understanding and agreed to proceed.   I provided 180 minutes of non-face-to-face time during this encounter.   Noralee Stain, LCSW, LCAS 10/17/23

## 2023-10-18 ENCOUNTER — Telehealth (HOSPITAL_COMMUNITY): Payer: Self-pay | Admitting: Psychiatry

## 2023-10-18 ENCOUNTER — Other Ambulatory Visit (HOSPITAL_COMMUNITY): Payer: No Typology Code available for payment source | Admitting: Psychiatry

## 2023-10-18 ENCOUNTER — Other Ambulatory Visit (HOSPITAL_COMMUNITY): Payer: Self-pay

## 2023-10-18 ENCOUNTER — Other Ambulatory Visit: Payer: Self-pay

## 2023-10-19 ENCOUNTER — Other Ambulatory Visit (HOSPITAL_COMMUNITY): Payer: Self-pay

## 2023-10-19 ENCOUNTER — Encounter: Payer: Self-pay | Admitting: Hematology and Oncology

## 2023-10-19 ENCOUNTER — Other Ambulatory Visit: Payer: Self-pay

## 2023-10-19 ENCOUNTER — Other Ambulatory Visit (HOSPITAL_COMMUNITY): Payer: No Typology Code available for payment source | Admitting: Psychiatry

## 2023-10-19 ENCOUNTER — Telehealth (HOSPITAL_COMMUNITY): Payer: BC Managed Care – PPO | Admitting: Psychiatry

## 2023-10-19 DIAGNOSIS — F411 Generalized anxiety disorder: Secondary | ICD-10-CM

## 2023-10-19 DIAGNOSIS — F319 Bipolar disorder, unspecified: Secondary | ICD-10-CM

## 2023-10-19 NOTE — Progress Notes (Signed)
Virtual Visit via Video Note  I connected with Sandra Miller on @TODAY @ at  9:00 AM EST by a video enabled telemedicine application and verified that I am speaking with the correct person using two identifiers.  Location: Patient: at home Provider: at office   I discussed the limitations of evaluation and management by telemedicine and the availability of in person appointments. The patient expressed understanding and agreed to proceed.  I discussed the assessment and treatment plan with the patient. The patient was provided an opportunity to ask questions and all were answered. The patient agreed with the plan and demonstrated an understanding of the instructions.   The patient was advised to call back or seek an in-person evaluation if the symptoms worsen or if the condition fails to improve as anticipated.  I provided 30 minutes of non-face-to-face time during this encounter.   Jeri Modena, M.Ed,CNA   Patient ID: Sandra Miller, female   DOB: 06-08-1976, 48 y.o.   MRN: [id number] D:   This is a 48 yr old, single, African American, employed, female who was referred per Dr. Lolly Mustache.  Pt is well-known to this case manager d/t previous admissions; most recent 2017.  Primary stressor:  1) Medical and grief/loss issues:  In May 2021 pt was dx'd with two different types of cancer (vulva and breast).  Pt underwent radiation.  Has been in remission since 2023.  Pt states she was dx'd with Graves Disease.  On 03-31-23, pt had a medical emergency (seizures).  Had to be put in a coma.  Pt just recently got her driving privileges back; but is afraid to drive b/c she hasn't in ~ 6 months.  "I am scared that I will have another seizure."  Pt has been seeing Dr. Lolly Mustache for yrs; therapist is needed.  Pt is currently out on short term disability.  cc: previous notes for further hx   Pt attended twelve days, except for three d/t illness or scheduled appointments.  Pt was very active in all the groups.  Pt  reports although MH-IOP is helping, she continues to struggle with depression and anxiety.  "I still feel pretty depressed/anxious because of my health issues."  On a scale of 1-10 (10 being the worst), pt rates her depression at a 10 and anxiety at a 8.  Denies SI/HI or A/V hallucinations.  C/O having nightmares the past two nights.  "The dreams seem so real."  Pt states she has been waking up with wet clothes and sheets.  Pt attributes some of her anxiety from starting a new job on 11-03-23.  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  "I will be on first shift at this facility.  Home health doesn't pay as much as a facility." Pt attributes anxiety towards upcoming appts also.  Pt has two upcoming oncologist and one cardiologist appt coming up this month. A:  D/C today.  F/U with Dr. Lolly Mustache on 10-24-23 @ 10 a.m (video).  Discussed referral to a therapist (female preferably per pt's request).  Pt is requesting to schedule appt with a therapist in April (@ 3:30 pm or after).  Encouraged pt to call back mid-end of March in order to get on a female therapist's schedule.. Dr. Lolly Mustache. Pt was advised of ROI must be obtained prior to any records release in order to collaborate her care with an outside provider.  Pt was advised if she has not already done so to contact the front desk to sign  all necessary forms in order for MH-IOP to release info re: her care.  Consent:  Pt gives verbal consent for tx and assignment of benefits for services provided during this telehealth group process.  Pt expressed understanding and agreed to proceed. Collaboration of care:  Collaborate with Dr. Gerlene Burdock; Dr. Park Pope AEB; Hillery Jacks, NP AEB; Noralee Stain, LCSW AEB.   Encouraged support groups through [facility].  R:  Pt receptive.  Jeri Modena, M.Ed,CNA

## 2023-10-19 NOTE — Patient Instructions (Signed)
D:  Patient successfully completed virtual MH-IOP today.  A:  Discharge today.  Follow up with Dr. Lolly Mustache on 10-24-23 @ 10 a.m (video).  Encouraged support groups through [facility] @ [phone].  Patient is requesting a therapist starting in April 2025.  *Call the office [phone]) in March/April to see if anyone is accepting new patients.  Return to work on 11-03-23.  R:  Patient receptive.

## 2023-10-19 NOTE — Progress Notes (Signed)
Virtual Visit via Video Note   I connected with Sandra Miller on 10/19/23 at  9:00 AM EDT by a video enabled telemedicine application and verified that I am speaking with the correct person using two identifiers.   At orientation to the IOP program, Case Manager discussed the limitations of evaluation and management by telemedicine and the availability of in person appointments. The patient expressed understanding and agreed to proceed with virtual visits throughout the duration of the program.   Location:  Patient: Patient Home Provider: [facility]   History of Present Illness: Bipolar I Disorder and GAD   Observations/Objective: Check In: Case Manager checked in with all participants to review discharge dates, insurance authorizations, work-related documents and needs from the treatment team regarding medications. Sandra Miller stated needs and engaged in discussion.    Initial Therapeutic Activity: Counselor facilitated a check-in with Sandra Miller to assess for safety, sobriety and medication compliance.  Counselor also inquired about Alacia's current emotional ratings, as well as any significant changes in thoughts, feelings or behavior since previous check in.  Sandra Miller presented for session on time and was alert, oriented x5, with no evidence or self-report of active SI/HI or A/V H.  Sandra Miller reported compliance with medication and denied use of alcohol or illicit substances.  Sandra Miller reported scores of 10/10 for depression, 9/10 for anxiety, and 0/10 for anger/irritability.  Sandra Miller denied any recent outbursts or panic attacks.  Sandra Miller reported that a struggle has been continuing to experience a migraine, and pain, which led her to miss yesterday's group.  Sandra Miller reported that a success was fixing dinner for the family yesterday despite not feeling well, stating "I wanted to make sure my mom had something to eat".  Sandra Miller reported that her goal today is to get out of the house with her daughter  later if she feels better, since the rain is supposed to clear up.   Second Therapeutic Activity: Counselor introduced topic of stress management today.  Counselor provided definition of stress as feeling tense, overwhelmed, worn out, and/or exhausted, and noted that in small amounts, stress can be motivating until things become too overwhelming to manage.  Counselor also explained how stress can be acute (brief but intense) or chronic (long-lasting) and this can impact the severity of symptoms one can experience in the physical, emotional, and behavioral categories.  Counselor inquired about members' specific stressors, how long they have been prevalent, and the various symptoms that tend to manifest as a result.  Counselor also offered several stress management strategies to help improve members' coping ability, including journaling, gratitude practice, relaxation techniques, and time management tips.  Counselor also explained that research has shown a strong support network composed of trusted family, friends, or community members can increase resilience in times of stress, and inquired about who members can reach out to for help in managing stressors.  Counselor encouraged members to consider discussing stressor 'red flags' with their close supports that can be monitored and strategies for assisting them in times of crisis.  Intervention was effective, as evidenced by Sandra Miller actively participating in discussion on subject, reporting that her most significant stressors include changing jobs, keeping healthy, managing pain/fatigue, and dealing with traffic on the road.  Sandra Miller was able to identify several warning signs related to stress, including hyperventilating, chest tightness, fatigue, body heat, nausea, and headaches.  Sandra Miller reported that her stress management goal is to practice deep breathing and meditation daily as she begins her new job in order to reduce  sense of discomfort during daily duties.   Sandra Miller also expressed receptiveness to several stress management strategies practiced today in session, including deep breathing, maintaining a 'stress tracker' in her journal, and progressive muscle relaxation.     Assessment and Plan: Sandra Miller has requested discharge from [facility] on this date.  Counselor recommends adherence to crisis/safety plan, taking medications as prescribed, and following up with medical professionals if any issues arise.    Follow Up Instructions: Sandra Miller was advised to call back or seek an in-person evaluation if the symptoms worsen or if the condition fails to improve as anticipated.   Collaboration of Care:   Medication Management AEB Hillery Jacks, NP or Dr. Park Pope                                          Case Manager AEB Jeri Modena, CNA    Patient/Guardian was advised Release of Information must be obtained prior to any record release in order to collaborate their care with an outside provider. Patient/Guardian was advised if they have not already done so to contact the registration department to sign all necessary forms in order for [place] to release information regarding their care.    Consent: Patient/Guardian gives verbal consent for treatment and assignment of benefits for services provided during this visit. Patient/Guardian expressed understanding and agreed to proceed.   I provided 180 minutes of non-face-to-face time during this encounter.   Noralee Stain, LCSW, LCAS 10/19/23

## 2023-10-20 ENCOUNTER — Other Ambulatory Visit (HOSPITAL_COMMUNITY): Payer: BC Managed Care – PPO

## 2023-10-20 ENCOUNTER — Encounter: Payer: Self-pay | Admitting: Hematology and Oncology

## 2023-10-20 NOTE — Progress Notes (Addendum)
  [facility] Intensive Outpatient Program Discharge Summary  ELA MOFFAT [id number]  Admission date: 10/02/2023 Discharge date: 10/18/2023  Reason for admission: Per admission assessment note: "Waunita KYNLEI PIONTEK is a 48 y.o. female with a history of generalized anxiety disorder, bipolar disorder type 1, alcohol use disorder in sustained remission, grave's disease, vulvar and breast cancer 2021 who presents to [facility] via video conferencing for initial evaluation of worsening depression.  Patient reports primarily neurovegetative symptoms that have been secondary to multiple medical problems and self-isolation. She sees Dr. Lolly Mustache outpatient and had been recently trialed on Trintellix but did not tolerate it due to severe nausea."   Progress in Program Toward Treatment Goals: Progressing, patient attended and participated within the group session when active engaged participation.  This provider did not see patient at discharge.  Per treatment team no concerns documented at discharge patient medication refills by psychiatrist Arfeen. Support,encouragement  and reassurance was provided.  Progress (rationale): Take all of you medications as prescribed by your mental healthcare provider.  Report any adverse effects and reactions from your medications to your outpatient provider promptly.  Do not engage in alcohol and or illegal drug use while on prescription medicines. Keep all scheduled appointments. This is to ensure that you are getting refills on time and to avoid any interruption in your medication.  If you are unable to keep an appointment call to reschedule.  Be sure to follow up with resources and follow ups given. In the event of worsening symptoms call the crisis hotline, 911, and or go to the nearest emergency department for appropriate evaluation and treatment of symptoms. Follow-up with your primary care provider for your medical issues, concerns and  or health care needs.    Collaboration of Care: Psychiatrist AEB Afreen   Patient/Guardian was advised Release of Information must be obtained prior to any record release in order to collaborate their care with an outside provider. Patient/Guardian was advised if they have not already done so to contact the registration department to sign all necessary forms in order for [place] to release information regarding their care.   Consent: Patient/Guardian gives verbal consent for treatment and assignment of benefits for services provided during this visit. Patient/Guardian expressed understanding and agreed to proceed.   Hillery Jacks NP  10/20/2023

## 2023-10-23 ENCOUNTER — Other Ambulatory Visit (HOSPITAL_COMMUNITY): Payer: BC Managed Care – PPO

## 2023-10-23 ENCOUNTER — Other Ambulatory Visit: Payer: Self-pay

## 2023-10-23 ENCOUNTER — Encounter: Payer: Self-pay | Admitting: Hematology and Oncology

## 2023-10-24 ENCOUNTER — Ambulatory Visit: Payer: Self-pay | Attending: Internal Medicine

## 2023-10-24 ENCOUNTER — Encounter: Payer: Self-pay | Admitting: Neurology

## 2023-10-24 ENCOUNTER — Encounter (HOSPITAL_COMMUNITY): Payer: Self-pay | Admitting: Psychiatry

## 2023-10-24 ENCOUNTER — Telehealth (HOSPITAL_COMMUNITY): Payer: Medicaid Other | Admitting: Psychiatry

## 2023-10-24 ENCOUNTER — Encounter: Payer: Self-pay | Admitting: Hematology and Oncology

## 2023-10-24 ENCOUNTER — Other Ambulatory Visit (HOSPITAL_COMMUNITY): Payer: Self-pay

## 2023-10-24 ENCOUNTER — Other Ambulatory Visit: Payer: Self-pay

## 2023-10-24 VITALS — Wt 205.0 lb

## 2023-10-24 DIAGNOSIS — F411 Generalized anxiety disorder: Secondary | ICD-10-CM | POA: Diagnosis not present

## 2023-10-24 DIAGNOSIS — F319 Bipolar disorder, unspecified: Secondary | ICD-10-CM | POA: Diagnosis not present

## 2023-10-24 MED ORDER — SERTRALINE HCL 50 MG PO TABS
75.0000 mg | ORAL_TABLET | Freq: Every day | ORAL | 1 refills | Status: DC
  Filled 2023-10-24: qty 45, 30d supply, fill #0
  Filled 2023-11-18: qty 45, 30d supply, fill #1

## 2023-10-24 NOTE — Progress Notes (Signed)
[facility] MD Virtual Progress Note   Patient Location: Home Provider Location: Home Office  I connect with patient by video and verified that I am speaking with correct person by using two identifiers. I discussed the limitations of evaluation and management by telemedicine and the availability of in person appointments. I also discussed with the patient that there may be a patient responsible charge related to this service. The patient expressed understanding and agreed to proceed.  Sandra Miller [phone] 48 y.o.  10/24/2023 9:56 AM  History of Present Illness:  Patient is evaluated by video session.  She just finished IOP few days ago.  She noticed improvement with the program.  We started her on Trintellix but she could not handle the side effects and experiencing nausea and throw up.  Will switch her back to Prozac until she started the program.  She is now on Zoloft started in IOP.  She is taking 50 mg for past few days.  So far no major concerns or side effects.  She told her job is terminated on January 31 but she has a new job starting end of this month.  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.  She has to go through paperwork and drug screen.  She reported chronic fatigue, lack of energy, lack of motivation and not sleeping well.  She also reported a lot of headaches.  She denies any homicidal thoughts, mania or agitation but reported more about chronic fatigue, depression and severe anxiety.  She is taking Ativan 1 mg 3 times a day.  She admitted weight gain as not as active.  However today she is up early today and able to go outside and out of bed.  She has a good support from her family.  She denies any hallucination.  She has no tremors or shakes.  She has no more seizures and since the last visit no labs other than magnesium.  She denies drinking or using any illegal substances.  She admitted to struggle with migraine headaches in some time nightmares.   She denies any major panic attack but she feels very nervous and anxious about her health.  She is going to start 40 hours a week but not sure if she need to cut down if she cannot handle a job.  She is not driving recently.  Past Psychiatric History: H/O bipolar disorder, alcohol dependence and marijuana abuse.  H/O at least 6 inpatient. Tried lithium, Seroquel, Lamictal, Paxil, Celexa, Abilify, Geodon, Klonopin, Depakote, Neurontin and Cymbalta.  Trintellix caused nausea. No H/o history of suicidal attempt.  Olanzapine helped but it was discontinued in July 2024 due to abnormal EKG. Prozac helped for while until stopped working.   Outpatient Encounter Medications as of 10/24/2023  Medication Sig   famotidine (PEPCID) 40 MG tablet Take 1 tablet (40 mg total) by mouth daily.   FLUoxetine (PROZAC) 10 MG capsule Take 1 capsule (10 mg total) by mouth daily for 7 days. Take one pill for week and than discontinue   Fremanezumab-vfrm (AJOVY) 225 MG/1.5ML SOAJ Inject 225 mg into the skin every 30 (thirty) days.   gabapentin (NEURONTIN) 300 MG capsule Take 1 capsule (300 mg) by mouth daily.   LamoTRIgine (LAMICTAL XR) 200 MG TB24 24 hour tablet Take 1 tablet (200 mg total) by mouth at bedtime.   letrozole (FEMARA) 2.5 MG tablet Take 1 tablet by mouth daily.   lisinopril (ZESTRIL) 10 MG tablet Take 1 tablet (10 mg total)  by mouth daily. Stop the Lisinopril/HCTZ combination   loratadine (CLARITIN REDITABS) 10 MG dissolvable tablet Take 10 mg by mouth daily. OTC   LORazepam (ATIVAN) 1 MG tablet Take 1 tablet (1 mg total) by mouth 3 (three) times daily as needed for anxiety   Magnesium 400 MG TABS Take 800 mg by mouth 2 (two) times daily.   methimazole (TAPAZOLE) 5 MG tablet Take one tablet (5 mg dose) by mouth daily. (Patient taking differently: Take 5 mg by mouth daily.)   Multiple Vitamin (MULTIVITAMIN WITH MINERALS) TABS tablet Take 1 tablet by mouth daily.   omeprazole (PRILOSEC) 40 MG capsule Take 1  capsule (40 mg total) by mouth daily before breakfast.   potassium chloride SA (KLOR-CON M) 20 MEQ tablet Take 1 tablet (20 mEq) by mouth daily.   promethazine (PHENERGAN) 12.5 MG tablet Take 1 tablet (12.5 mg total) by mouth every 8 (eight) hours as needed for nausea or vomiting.   propranolol (INDERAL) 10 MG tablet Take 1 tablet by mouth 2 times daily.   sertraline (ZOLOFT) 50 MG tablet Take 0.5 tablets (25 mg total) by mouth daily for 6 days, THEN 1 tablet (50 mg total) daily for 24 days.   spironolactone (ALDACTONE) 25 MG tablet Take 0.5 tablets (12.5 mg total) by mouth daily.   Ubrogepant (UBRELVY) 50 MG TABS Take 50 mg as needed for migraine, a second dose may be taken at least 2 hours after the initial dose. DO NOT EXCEED 2 doses in 24 hours.   No facility-administered encounter medications on file as of 10/24/2023.    Recent Results (from the past 2160 hours)  Urinalysis, Routine w reflex microscopic -Urine, Clean Catch     Status: Abnormal   Collection Time: 08/29/23  4:52 AM  Result Value Ref Range   Color, Urine STRAW (A) YELLOW   APPearance HAZY (A) CLEAR   Specific Gravity, Urine 1.009 1.005 - 1.030   pH 5.0 5.0 - 8.0   Glucose, UA NEGATIVE NEGATIVE mg/dL   Hgb urine dipstick SMALL (A) NEGATIVE   Bilirubin Urine NEGATIVE NEGATIVE   Ketones, ur NEGATIVE NEGATIVE mg/dL   Protein, ur NEGATIVE NEGATIVE mg/dL   Nitrite NEGATIVE NEGATIVE   Leukocytes,Ua NEGATIVE NEGATIVE   RBC / HPF 0-5 0 - 5 RBC/hpf   WBC, UA 0-5 0 - 5 WBC/hpf   Bacteria, UA NONE SEEN NONE SEEN   Squamous Epithelial / HPF 6-10 0 - 5 /HPF   Mucus PRESENT     Comment: Performed at [facility], [address] [place], [place] [address]  CBC with Differential     Status: Abnormal   Collection Time: 08/29/23  6:22 AM  Result Value Ref Range   WBC 7.8 4.0 - 10.5 K/uL   RBC 5.65 (H) 3.87 - 5.11 MIL/uL   Hemoglobin 16.3 (H) 12.0 - 15.0 g/dL   HCT 46.9 (H) 62.9 - 52.8 %   MCV 89.7 80.0 -  100.0 fL   MCH 28.8 26.0 - 34.0 pg   MCHC 32.1 30.0 - 36.0 g/dL   RDW 41.3 24.4 - 01.0 %   Platelets 226 150 - 400 K/uL   nRBC 0.0 0.0 - 0.2 %   Neutrophils Relative % 77 %   Neutro Abs 6.0 1.7 - 7.7 K/uL   Lymphocytes Relative 17 %   Lymphs Abs 1.4 0.7 - 4.0 K/uL   Monocytes Relative 5 %   Monocytes Absolute 0.4 0.1 - 1.0 K/uL   Eosinophils Relative 1 %  Eosinophils Absolute 0.1 0.0 - 0.5 K/uL   Basophils Relative 0 %   Basophils Absolute 0.0 0.0 - 0.1 K/uL   Immature Granulocytes 0 %   Abs Immature Granulocytes 0.03 0.00 - 0.07 K/uL    Comment: Performed at [facility], [address], [place], [place] [address]  Comprehensive metabolic panel     Status: Abnormal   Collection Time: 08/29/23  6:22 AM  Result Value Ref Range   Sodium 135 135 - 145 mmol/L   Potassium 3.6 3.5 - 5.1 mmol/L   Chloride 100 98 - 111 mmol/L   CO2 23 22 - 32 mmol/L   Glucose, Bld 116 (H) 70 - 99 mg/dL    Comment: Glucose reference range applies only to samples taken after fasting for at least 8 hours.   BUN 11 6 - 20 mg/dL   Creatinine, Ser 2.59 (L) 0.44 - 1.00 mg/dL   Calcium 56.3 8.9 - 87.5 mg/dL   Total Protein 8.3 (H) 6.5 - 8.1 g/dL   Albumin 4.5 3.5 - 5.0 g/dL   AST 22 15 - 41 U/L   ALT 15 0 - 44 U/L   Alkaline Phosphatase 71 38 - 126 U/L   Total Bilirubin 1.1 <1.2 mg/dL   GFR, Estimated >64 >33 mL/min    Comment: (NOTE) Calculated using the CKD-EPI Creatinine Equation (2021)    Anion gap 12 5 - 15    Comment: Performed at [facility], [address] [place], [place] [address]  Lipase, blood     Status: None   Collection Time: 08/29/23  6:22 AM  Result Value Ref Range   Lipase 34 11 - 51 U/L    Comment: Performed at [facility], [address] [place], [place] [address]  Magnesium     Status: None   Collection Time: 10/02/23  4:21 PM  Result Value Ref Range   Magnesium 1.7 1.6 - 2.3 mg/dL     Psychiatric Specialty  Exam: Physical Exam  Review of Systems  Neurological:  Positive for headaches.  Psychiatric/Behavioral:  Positive for dysphoric mood.     Weight 205 lb (93 kg).There is no height or weight on file to calculate BMI.  General Appearance: Casual  Eye Contact:  Fair  Speech:  Slow  Volume:  Decreased  Mood:  Anxious and Dysphoric  Affect:  Depressed  Thought Process:  Goal Directed  Orientation:  Full (Time, Place, and Person)  Thought Content:  Rumination  Suicidal Thoughts:  No  Homicidal Thoughts:  No  Memory:  Immediate;   Good Recent;   Fair Remote;   Fair  Judgement:  Intact  Insight:  Present  Psychomotor Activity:  Decreased  Concentration:  Concentration: Good and Attention Span: Good  Recall:  Good  Fund of Knowledge:  Good  Language:  Good  Akathisia:  No  Handed:  Right  AIMS (if indicated):     Assets:  Communication Skills Desire for Improvement Housing Social Support Transportation  ADL's:  Intact  Cognition:  WNL  Sleep:  fair     Assessment/Plan: Bipolar 1 disorder (HCC) - Plan: sertraline (ZOLOFT) 50 MG tablet  GAD (generalized anxiety disorder) - Plan: sertraline (ZOLOFT) 50 MG tablet  I reviewed notes from IOP, current medication, labs.  Trintellix did not work and Prozac was stopped in the IOP.  She is taking Zoloft 50 mg.  She is believed does need to increase because she is struggled with chronic depression and  anxiety.  I agreed with the plan.  Recommend to increase Zoloft 75 mg and suggested to take in the morning if no nausea.  She also complaining of headaches.  She used to take Topamax and I recommend talk to her neurologist if they can add Topamax low-dose.  I explained Topamax can help her sleep, headaches and beneficial for weight loss.  Patient promised to give call to neurology.  She is also on Lamictal from neurology.  Will continue Ativan 1 mg 3 times a day.  Patient is in the process of seeing a therapist once her insurance becomes active.   Encouraged to call us back if she has any question or any concern.  Will follow-up in 2 months.  Encourage walking, watching her calorie intake.   Follow Up Instructions:     I discussed the assessment and treatment plan with the patient. The patient was provided an opportunity to ask questions and all were answered. The patient agreed with the plan and demonstrated an understanding of the instructions.   The patient was advised to call back or seek an in-person evaluation if the symptoms worsen or if the condition fails to improve as anticipated.    Collaboration of Care: Other provider involved in patient's care AEB notes are available in epic to review  Patient/Guardian was advised Release of Information must be obtained prior to any record release in order to collaborate their care with an outside provider. Patient/Guardian was advised if they have not already done so to contact the registration department to sign all necessary forms in order for [place] to release information regarding their care.   Consent: Patient/Guardian gives verbal consent for treatment and assignment of benefits for services provided during this visit. Patient/Guardian expressed understanding and agreed to proceed.     I provided 32 minutes of non face to face time during this encounter.  Note: This document was prepared by [employer] voice dictation technology and any errors that results from this process are unintentional.    Cleotis Nipper, MD 10/24/2023

## 2023-10-29 NOTE — Progress Notes (Unsigned)
 Cardiology Office Note:    Date:  11/01/2023   ID:  Sandra Miller 1976/03/18, MRN [id number]  PCP:  Marcine Matar, MD  Cardiologist:  Little Ishikawa, MD  Electrophysiologist:  None   Referring MD: Marcine Matar, MD   Chief Complaint  Patient presents with   Follow-up    3-4 months.   Headache   Shortness of Breath    History of Present Illness:    Sandra Miller is a 48 y.o. female with a hx of BPD, breast cancer, Graves' disease, gastric ulcer, hypertension, former alcohol abuse, tobacco use who presents for follow-up.  She was referred by Dr. Laural Benes for evaluation of abnormal EKG.  She was admitted July 2024 after having witnessed seizures.  Labs notable for magnesium 0.8.  Neurology was consulted and she was started on Keppra.  Hypomagnesemia and hypokalemia were treated.  She was admitted again in August 2024 with dizziness, found to have prolonged QT interval.  Her Zyprexa was discontinued and hypomagnesemia/hypokalemia were treated.  She was referred to nephrology for persistent hypomagnesemia and hypokalemia.    Echo 04/2023 EF 55-60%, normal RV function, no significiant valvular disease.  Zio patch x14 days 07/2023 showed no significant arrhythmias.  Since last clinic visit, she reports she is doing okay.  Does report some shortness of breath when laying down at night.  Has been having some dizziness but denies any syncope.  Continues to have palpitations, recently saw her endocrinologist and decreased her methimazole dose.  She denies any chest pain or lower extremity edema.    Past Medical History:  Diagnosis Date   Arthritis    Bipolar 1 disorder (HCC)    Cancer (HCC)    vulva, and breast   Complication of anesthesia    wakes up during procedures   Depression    GAD (generalized anxiety disorder)    Genital HSV    currently per pt  no break out 03-22-2016    GERD (gastroesophageal reflux disease)    Graves disease    Hiatal hernia     History of cervical dysplasia    2012 laser ablation   History of esophageal dilatation    for dysphasia -- x2 dilated   History of gastric ulcer    History of Helicobacter pylori infection    remote hx   History of hidradenitis suppurativa    "gets all over body intermittantly"     History of hypertension    no issue since stopped drinking alcohol 2014   History of kidney stones    History of panic attacks    History of radiation therapy 03/16/2020-05/08/2020   vulva  Dr Antony Blackbird   History of radiation therapy 09/09/2020-10/23/2020   left chest wall/left SCV   Dr Antony Blackbird   Hypertension    Iron deficiency anemia    Left ureteral stone    OCD (obsessive compulsive disorder)    PONV (postoperative nausea and vomiting)    Pre-diabetes    PTSD (post-traumatic stress disorder)    Recovering alcoholic in remission (HCC)    since 2014   RLS (restless legs syndrome)    Smokers' cough (HCC)    Thyroid disease    Urgency of urination    Yeast infection involving the vagina and surrounding area    secondary to taking antibiotic    Past Surgical History:  Procedure Laterality Date   CESAREAN SECTION  1995   w/  Bilateral Tubal Ligation   COLONOSCOPY  last one 08-09-2013   CYSTOSCOPY W/ URETERAL STENT PLACEMENT Left 03/29/2016   Procedure: CYSTOSCOPY WITH STENT REPLACEMENT;  Surgeon: Hildred Laser, MD;  Location: [facility];  Service: Urology;  Laterality: Left;   CYSTOSCOPY WITH RETROGRADE PYELOGRAM, URETEROSCOPY AND STENT PLACEMENT Left 03/08/2016   Procedure: CYSTOSCOPY WITH  LEFT RETROGRADE PYELOGRAM, AND STENT PLACEMENT;  Surgeon: Hildred Laser, MD;  Location: [facility];  Service: Urology;  Laterality: Left;   CYSTOSCOPY/RETROGRADE/URETEROSCOPY/STONE EXTRACTION WITH BASKET Left 03/29/2016   Procedure: CYSTOSCOPY/RETROGRADE/URETEROSCOPY/STONE EXTRACTION WITH BASKET;  Surgeon: Hildred Laser, MD;  Location: [facility];  Service: Urology;   Laterality: Left;   ENDOMETRIAL ABLATION W/ NOVASURE  04-01-2010   ESOPHAGOGASTRODUODENOSCOPY  last one 08-09-2013   KNEE ARTHROSCOPY Left as teen   LASER ABLATION OF THE CERVIX  2012 approx   MASTECTOMY WITH AXILLARY LYMPH NODE DISSECTION Left 07/27/2020   Procedure: LEFT MASTECTOMY WITH LEFT RADIOACTIVE SEED GUIDED TARGETED AXILLARY LYMPH NODE DISSECTION;  Surgeon: Ovidio Kin, MD;  Location: [facility];  Service: General;  Laterality: Left;   PORT-A-CATH REMOVAL Right 05/18/2021   Procedure: REMOVAL PORT-A-CATH;  Surgeon: Manus Rudd, MD;  Location: [facility];  Service: General;  Laterality: Right;   PORTACATH PLACEMENT Right 03/06/2020   Procedure: INSERTION PORT-A-CATH WITH ULTRASOUND GUIDANCE;  Surgeon: Ovidio Kin, MD;  Location: [facility];  Service: General;  Laterality: Right;   ROBOTIC ASSISTED TOTAL HYSTERECTOMY WITH BILATERAL SALPINGO OOPHERECTOMY N/A 05/18/2021   Procedure: XI ROBOTIC ASSISTED TOTAL HYSTERECTOMY WITH BILATERAL SALPINGO OOPHORECTOMY;  Surgeon: Adolphus Birchwood, MD;  Location: [facility];  Service: Gynecology;  Laterality: N/A;   TRANSTHORACIC ECHOCARDIOGRAM  05-19-2006   lvsf normal, ef 55-65%, there was mild flattening of the interventricular septum during diastoli/  RV size at upper limits normal   TUBAL LIGATION     VULVA /PERINEUM BIOPSY N/A 05/18/2021   Procedure: VULVAR BIOPSY;  Surgeon: Adolphus Birchwood, MD;  Location: [facility];  Service: Gynecology;  Laterality: N/A;   WISDOM TOOTH EXTRACTION  age 32 's    Current Medications: Current Meds  Medication Sig   famotidine (PEPCID) 40 MG tablet Take 1 tablet (40 mg total) by mouth daily.   Fremanezumab-vfrm (AJOVY) 225 MG/1.5ML SOAJ Inject 225 mg into the skin every 30 (thirty) days.   gabapentin (NEURONTIN) 300 MG capsule Take 1 capsule (300 mg) by mouth daily.   LamoTRIgine (LAMICTAL XR) 200 MG TB24 24 hour tablet Take 1 tablet (200 mg total) by mouth at bedtime.   letrozole (FEMARA) 2.5 MG tablet Take 1 tablet  by mouth daily.   lisinopril (ZESTRIL) 10 MG tablet Take 1 tablet (10 mg total) by mouth daily. Stop the Lisinopril/HCTZ combination   loratadine (CLARITIN REDITABS) 10 MG dissolvable tablet Take 10 mg by mouth daily. OTC   LORazepam (ATIVAN) 1 MG tablet Take 1 tablet (1 mg total) by mouth 3 (three) times daily as needed for anxiety   Magnesium 400 MG TABS Take 800 mg by mouth 2 (two) times daily.   methimazole (TAPAZOLE) 5 MG tablet Take one tablet (5 mg dose) by mouth daily. (Patient taking differently: Take 5 mg by mouth daily.)   Multiple Vitamin (MULTIVITAMIN WITH MINERALS) TABS tablet Take 1 tablet by mouth daily.   omeprazole (PRILOSEC) 40 MG capsule Take 1 capsule (40 mg total) by mouth daily before breakfast.   potassium chloride SA (KLOR-CON M) 20 MEQ tablet Take 1 tablet (20 mEq) by mouth daily.   promethazine (PHENERGAN) 12.5 MG tablet  Take 1 tablet (12.5 mg total) by mouth every 8 (eight) hours as needed for nausea or vomiting.   propranolol (INDERAL) 10 MG tablet Take 1 tablet by mouth 2 times daily.   sertraline (ZOLOFT) 50 MG tablet Take 1.5 tablets (75 mg total) by mouth daily.   spironolactone (ALDACTONE) 25 MG tablet Take 0.5 tablets (12.5 mg total) by mouth daily.   Ubrogepant (UBRELVY) 50 MG TABS Take 50 mg as needed for migraine, a second dose may be taken at least 2 hours after the initial dose. DO NOT EXCEED 2 doses in 24 hours.     Allergies:   Dilaudid [hydromorphone hcl], Fentanyl, Aspirin, Depakote [divalproex sodium], and Minocycline   Social History   Socioeconomic History   Marital status: Single    Spouse name: Not on file   Number of children: Not on file   Years of education: Not on file   Highest education level: Associate degree: academic program  Occupational History   Not on file  Tobacco Use   Smoking status: Former    Current packs/day: 0.50    Types: Cigarettes   Smokeless tobacco: Never   Tobacco comments:    Recently started a smoking  cessation class. Quit 03/31/23  Vaping Use   Vaping status: Never Used  Substance and Sexual Activity   Alcohol use: Not Currently   Drug use: Not Currently    Types: Marijuana    Comment: none in about 1 year   Sexual activity: Yes    Partners: Male    Birth control/protection: Surgical  Other Topics Concern   Not on file  Social History Narrative   Former [facility] patient.      Was on disability at one point.   Return to the workforce.  40 hours a week at [employer], 10 hours a week on the weekends at [facility] in [place], [employer] at night 10 to 12 hours a week.      Has grown children, she lives alone with a pet, continues to smoke no alcohol or drug use at this time      History of EtOH abuse and THC use.         Social Drivers of Corporate investment banker Strain: Low Risk  (10/02/2023)   Overall Financial Resource Strain (CARDIA)    Difficulty of Paying Living Expenses: Not very hard  Recent Concern: Financial Resource Strain - Medium Risk (08/01/2023)   Overall Financial Resource Strain (CARDIA)    Difficulty of Paying Living Expenses: Somewhat hard  Food Insecurity: No Food Insecurity (10/02/2023)   Hunger Vital Sign    Worried About Running Out of Food in the Last Year: Never true    Ran Out of Food in the Last Year: Never true  Recent Concern: Food Insecurity - Food Insecurity Present (09/28/2023)   Received from [facility]   Hunger Vital Sign    Worried About Running Out of Food in the Last Year: Sometimes true    Ran Out of Food in the Last Year: Patient declined  Transportation Needs: Unmet Transportation Needs (10/02/2023)   PRAPARE - Administrator, Civil Service (Medical): Yes    Lack of Transportation (Non-Medical): No  Physical Activity: Inactive (10/02/2023)   Exercise Vital Sign    Days of Exercise per Week: 0 days    Minutes of Exercise per Session: 0 min  Stress: Stress Concern Present (10/02/2023)   [employer]  of Occupational Health - Occupational Stress  Questionnaire    Feeling of Stress : Rather much  Social Connections: Moderately Isolated (10/02/2023)   Social Connection and Isolation Panel [NHANES]    Frequency of Communication with Friends and Family: More than three times a week    Frequency of Social Gatherings with Friends and Family: Never    Attends Religious Services: 1 to 4 times per year    Active Member of [employer] or Organizations: No    Attends Banker Meetings: Never    Marital Status: Never married     Family History: The patient's family history includes ADD / ADHD in her brother; Alcohol abuse in her brother and father; Anxiety disorder in her maternal aunt and mother; Cancer in her cousin and paternal grandfather; Cirrhosis in her cousin; Colon polyps in her brother; Depression in her maternal aunt and mother; Diabetes in her maternal grandfather and paternal grandmother; Drug abuse in her brother and brother; Heart disease in her father and mother; Kidney disease in her maternal uncle; Lung cancer in her cousin and father; Lung cancer (age of onset: 48) in her paternal uncle; Throat cancer in her cousin.  ROS:   Please see the history of present illness.     All other systems reviewed and are negative.  EKGs/Labs/Other Studies Reviewed:    The following studies were reviewed today:   EKG:   07/06/2023: Normal sinus rhythm, rate 88, nonspecific T wave flattening, QTc 546 11/01/2023: Normal sinus rhythm, rate 94, QTc 460  Recent Labs: 04/12/2023: B Natriuretic Peptide 181.5 07/06/2023: TSH 3.180 08/29/2023: ALT 15; BUN 11; Creatinine, Ser 0.42; Hemoglobin 16.3; Platelets 226; Potassium 3.6; Sodium 135 10/02/2023: Magnesium 1.7  Recent Lipid Panel    Component Value Date/Time   CHOL 159 08/18/2022 1605   TRIG 120 08/18/2022 1605   HDL 51 08/18/2022 1605   CHOLHDL 3.1 08/18/2022 1605   CHOLHDL 4.7 Ratio 06/13/2008 2303   VLDL 32 06/13/2008 2303   LDLCALC 87  08/18/2022 1605    Physical Exam:    VS:  BP 130/88 (BP Location: Right Arm, Patient Position: Sitting, Cuff Size: Large)   Pulse 94   Ht 5' 5.5" (1.664 m)   Wt 209 lb (94.8 kg)   BMI 34.25 kg/m     Wt Readings from Last 3 Encounters:  11/01/23 209 lb (94.8 kg)  10/02/23 205 lb (93 kg)  08/01/23 195 lb (88.5 kg)     GEN:  Well nourished, well developed in no acute distress HEENT: Normal NECK: No JVD; No carotid bruits LYMPHATICS: No lymphadenopathy CARDIAC: RRR, no murmurs, rubs, gallops RESPIRATORY:  Clear to auscultation without rales, wheezing or rhonchi  ABDOMEN: Soft, non-tender, non-distended MUSCULOSKELETAL:  No edema; No deformity  SKIN: Warm and dry NEUROLOGIC:  Alert and oriented x 3 PSYCHIATRIC:  Normal affect   ASSESSMENT:    1. QT prolongation   2. Palpitations   3. Essential hypertension   4. Mixed hyperlipidemia     PLAN:    Prolonged QT: During admission 04/2023 noted to have prolonged QTc, her Zyprexa was discontinued.  She also had significant hypokalemia and hypomagnesemia, which were repleted, and she was referred to nephrology.  QTc today .  Avoid QT prolonging agents  Palpitations: Zio patch x14 days 07/2023 showed no significant arrhythmias. -Suspect related to her thyroid disease, reports recently decreased methimazole dose  Hypertension: On lisinopril 10 mg daily and spironolactone 12.5 mg daily and propranolol 10 mg twice daily.  Appears controlled.  Check BMET, magnesium  Hyperlipidemia:  LDL as high as 183 on 08/11/2020, but was down to 87 on 08/18/2022.  Update lipid panel.  Check calcium score to guide how aggressive to be in lowering cholesterol   RTC in 1 year   Medication Adjustments/Labs and Tests Ordered: Current medicines are reviewed at length with the patient today.  Concerns regarding medicines are outlined above.  Orders Placed This Encounter  Procedures   CT CARDIAC SCORING (SELF PAY ONLY)   Lipid panel   Basic  metabolic panel   Magnesium   EKG 12-Lead   No orders of the defined types were placed in this encounter.   Patient Instructions  Medication Instructions:  No changes. *If you need a refill on your cardiac medications before your next appointment, please call your pharmacy*   Lab Work: Fasting Lipid, BMET, Magnesium today If you have labs (blood work) drawn today and your tests are completely normal, you will receive your results only by: MyChart Message (if you have MyChart) OR A paper copy in the mail If you have any lab test that is abnormal or we need to change your treatment, we will call you to review the results.   Testing/Procedures: Dr Bjorn Pippin has ordered a CT coronary calcium score.   Test locations:  [facility]  [facility] at [facility]  This is $99 out of pocket.   Coronary CalciumScan A coronary calcium scan is an imaging test used to look for deposits of calcium and other fatty materials (plaques) in the inner lining of the blood vessels of the heart (coronary arteries). These deposits of calcium and plaques can partly clog and narrow the coronary arteries without producing any symptoms or warning signs. This puts a person at risk for a heart attack. This test can detect these deposits before symptoms develop. Tell a health care provider about: Any allergies you have. All medicines you are taking, including vitamins, herbs, eye drops, creams, and over-the-counter medicines. Any problems you or family members have had with anesthetic medicines. Any blood disorders you have. Any surgeries you have had. Any medical conditions you have. Whether you are pregnant or may be pregnant. What are the risks? Generally, this is a safe procedure. However, problems may occur, including: Harm to a pregnant woman and her unborn baby. This test involves the use of radiation. Radiation exposure can  be dangerous to a pregnant woman and her unborn baby. If you are pregnant, you generally should not have this procedure done. Slight increase in the risk of cancer. This is because of the radiation involved in the test. What happens before the procedure? No preparation is needed for this procedure. What happens during the procedure? You will undress and remove any jewelry around your neck or chest. You will put on a hospital gown. Sticky electrodes will be placed on your chest. The electrodes will be connected to an electrocardiogram (ECG) machine to record a tracing of the electrical activity of your heart. A CT scanner will take pictures of your heart. During this time, you will be asked to lie still and hold your breath for 2-3 seconds while a picture of your heart is being taken. The procedure may vary among health care providers and hospitals. What happens after the procedure? You can get dressed. You can return to your normal activities. It is up to you to get the results of your test. Ask your health care provider, or the department that is doing the  test, when your results will be ready. Summary A coronary calcium scan is an imaging test used to look for deposits of calcium and other fatty materials (plaques) in the inner lining of the blood vessels of the heart (coronary arteries). Generally, this is a safe procedure. Tell your health care provider if you are pregnant or may be pregnant. No preparation is needed for this procedure. A CT scanner will take pictures of your heart. You can return to your normal activities after the scan is done. This information is not intended to replace advice given to you by your health care provider. Make sure you discuss any questions you have with your health care provider. Document Released: 02/18/2008 Document Revised: 07/11/2016 Document Reviewed: 07/11/2016 Elsevier Interactive Patient Education  2017 [employer].    Follow-Up: At [facility], you and your health needs are our priority.  As part of our continuing mission to provide you with exceptional heart care, we have created designated Provider Care Teams.  These Care Teams include your primary Cardiologist (physician) and Advanced Practice Providers (APPs -  Physician Assistants and Nurse Practitioners) who all work together to provide you with the care you need, when you need it.  We recommend signing up for the patient portal called "MyChart".  Sign up information is provided on this After Visit Summary.  MyChart is used to connect with patients for Virtual Visits (Telemedicine).  Patients are able to view lab/test results, encounter notes, upcoming appointments, etc.  Non-urgent messages can be sent to your provider as well.   To learn more about what you can do with MyChart, go to [email or web address].    Your next appointment:   1 year(s)  Provider:   Little Ishikawa, MD     Other Instructions         Signed, Little Ishikawa, MD  11/01/2023 11:27 AM     [facility]

## 2023-10-31 ENCOUNTER — Encounter: Payer: BC Managed Care – PPO | Admitting: Physical Therapy

## 2023-10-31 ENCOUNTER — Encounter: Payer: Self-pay | Admitting: Hematology and Oncology

## 2023-11-01 ENCOUNTER — Encounter: Payer: Self-pay | Admitting: Cardiology

## 2023-11-01 ENCOUNTER — Ambulatory Visit: Payer: Medicaid Other | Attending: Cardiology | Admitting: Cardiology

## 2023-11-01 VITALS — BP 130/88 | HR 94 | Ht 65.5 in | Wt 209.0 lb

## 2023-11-01 DIAGNOSIS — E782 Mixed hyperlipidemia: Secondary | ICD-10-CM

## 2023-11-01 DIAGNOSIS — R9431 Abnormal electrocardiogram [ECG] [EKG]: Secondary | ICD-10-CM | POA: Diagnosis not present

## 2023-11-01 DIAGNOSIS — I10 Essential (primary) hypertension: Secondary | ICD-10-CM

## 2023-11-01 DIAGNOSIS — R002 Palpitations: Secondary | ICD-10-CM | POA: Diagnosis not present

## 2023-11-01 LAB — LIPID PANEL
Chol/HDL Ratio: 3.9 {ratio} (ref 0.0–4.4)
Cholesterol, Total: 273 mg/dL — ABNORMAL HIGH (ref 100–199)
HDL: 70 mg/dL (ref >39)
LDL Chol Calc (NIH): 177 mg/dL — ABNORMAL HIGH (ref 0–99)
Triglycerides: 145 mg/dL (ref 0–149)
VLDL Cholesterol Cal: 26 mg/dL (ref 5–40)

## 2023-11-01 LAB — BASIC METABOLIC PANEL
BUN/Creatinine Ratio: 14 (ref 9–23)
BUN: 10 mg/dL (ref 6–24)
CO2: 22 mmol/L (ref 20–29)
Calcium: 10.8 mg/dL — ABNORMAL HIGH (ref 8.7–10.2)
Chloride: 98 mmol/L (ref 96–106)
Creatinine, Ser: 0.71 mg/dL (ref 0.57–1.00)
Glucose: 101 mg/dL — ABNORMAL HIGH (ref 70–99)
Potassium: 5 mmol/L (ref 3.5–5.2)
Sodium: 137 mmol/L (ref 134–144)
eGFR: 104 mL/min/{1.73_m2} (ref >59)

## 2023-11-01 LAB — MAGNESIUM: Magnesium: 1.8 mg/dL (ref 1.6–2.3)

## 2023-11-01 NOTE — Patient Instructions (Signed)
 Medication Instructions:  No changes. *If you need a refill on your cardiac medications before your next appointment, please call your pharmacy*   Lab Work: Fasting Lipid, BMET, Magnesium today If you have labs (blood work) drawn today and your tests are completely normal, you will receive your results only by: MyChart Message (if you have MyChart) OR A paper copy in the mail If you have any lab test that is abnormal or we need to change your treatment, we will call you to review the results.   Testing/Procedures: Dr Bjorn Pippin has ordered a CT coronary calcium score.   Test locations:  [facility]  [facility] at [facility]  This is $99 out of pocket.   Coronary CalciumScan A coronary calcium scan is an imaging test used to look for deposits of calcium and other fatty materials (plaques) in the inner lining of the blood vessels of the heart (coronary arteries). These deposits of calcium and plaques can partly clog and narrow the coronary arteries without producing any symptoms or warning signs. This puts a person at risk for a heart attack. This test can detect these deposits before symptoms develop. Tell a health care provider about: Any allergies you have. All medicines you are taking, including vitamins, herbs, eye drops, creams, and over-the-counter medicines. Any problems you or family members have had with anesthetic medicines. Any blood disorders you have. Any surgeries you have had. Any medical conditions you have. Whether you are pregnant or may be pregnant. What are the risks? Generally, this is a safe procedure. However, problems may occur, including: Harm to a pregnant woman and her unborn baby. This test involves the use of radiation. Radiation exposure can be dangerous to a pregnant woman and her unborn baby. If you are pregnant, you generally should not have this procedure done. Slight  increase in the risk of cancer. This is because of the radiation involved in the test. What happens before the procedure? No preparation is needed for this procedure. What happens during the procedure? You will undress and remove any jewelry around your neck or chest. You will put on a hospital gown. Sticky electrodes will be placed on your chest. The electrodes will be connected to an electrocardiogram (ECG) machine to record a tracing of the electrical activity of your heart. A CT scanner will take pictures of your heart. During this time, you will be asked to lie still and hold your breath for 2-3 seconds while a picture of your heart is being taken. The procedure may vary among health care providers and hospitals. What happens after the procedure? You can get dressed. You can return to your normal activities. It is up to you to get the results of your test. Ask your health care provider, or the department that is doing the test, when your results will be ready. Summary A coronary calcium scan is an imaging test used to look for deposits of calcium and other fatty materials (plaques) in the inner lining of the blood vessels of the heart (coronary arteries). Generally, this is a safe procedure. Tell your health care provider if you are pregnant or may be pregnant. No preparation is needed for this procedure. A CT scanner will take pictures of your heart. You can return to your normal activities after the scan is done. This information is not intended to replace advice given to you by your health care provider. Make sure you discuss any questions you  have with your health care provider. Document Released: 02/18/2008 Document Revised: 07/11/2016 Document Reviewed: 07/11/2016 Elsevier Interactive Patient Education  2017 [employer].    Follow-Up: At [facility], you and your health needs are our priority.  As part of our continuing mission to provide you with exceptional heart  care, we have created designated Provider Care Teams.  These Care Teams include your primary Cardiologist (physician) and Advanced Practice Providers (APPs -  Physician Assistants and Nurse Practitioners) who all work together to provide you with the care you need, when you need it.  We recommend signing up for the patient portal called "MyChart".  Sign up information is provided on this After Visit Summary.  MyChart is used to connect with patients for Virtual Visits (Telemedicine).  Patients are able to view lab/test results, encounter notes, upcoming appointments, etc.  Non-urgent messages can be sent to your provider as well.   To learn more about what you can do with MyChart, go to [email or web address].    Your next appointment:   1 year(s)  Provider:   Little Ishikawa, MD     Other Instructions

## 2023-11-02 ENCOUNTER — Encounter: Payer: BC Managed Care – PPO | Admitting: Physical Therapy

## 2023-11-03 ENCOUNTER — Encounter: Payer: Self-pay | Admitting: *Deleted

## 2023-11-06 ENCOUNTER — Ambulatory Visit: Payer: BC Managed Care – PPO | Admitting: Internal Medicine

## 2023-11-06 NOTE — Telephone Encounter (Signed)
 Sandra Miller has a CT cardiac scoring appointment early April. I will update Dr Leone Payor with this information.

## 2023-11-07 NOTE — Telephone Encounter (Signed)
 Hi Chris,  It would appear to me this lady could undergo EGDand colonoscopy - do you agree re: cardiac clearance?  We had put those on hold (seen in GI in Sept) due to ongoing cardiac eval.  Thanks  Baldo Ash

## 2023-11-08 ENCOUNTER — Other Ambulatory Visit: Payer: Self-pay | Admitting: *Deleted

## 2023-11-08 ENCOUNTER — Encounter: Payer: Self-pay | Admitting: Cardiology

## 2023-11-08 DIAGNOSIS — Z170 Estrogen receptor positive status [ER+]: Secondary | ICD-10-CM

## 2023-11-08 NOTE — Telephone Encounter (Signed)
 Yes I think should be fine from cardiac standpoint for EGD and colonoscopy Thanks, Thayer Ohm

## 2023-11-09 ENCOUNTER — Inpatient Hospital Stay (HOSPITAL_BASED_OUTPATIENT_CLINIC_OR_DEPARTMENT_OTHER): Payer: No Typology Code available for payment source | Admitting: Hematology and Oncology

## 2023-11-09 ENCOUNTER — Inpatient Hospital Stay: Payer: No Typology Code available for payment source | Attending: Hematology and Oncology

## 2023-11-09 ENCOUNTER — Encounter: Payer: Self-pay | Admitting: Internal Medicine

## 2023-11-09 ENCOUNTER — Encounter: Payer: Self-pay | Admitting: Hematology and Oncology

## 2023-11-09 ENCOUNTER — Other Ambulatory Visit: Payer: Self-pay

## 2023-11-09 ENCOUNTER — Inpatient Hospital Stay: Payer: No Typology Code available for payment source

## 2023-11-09 ENCOUNTER — Other Ambulatory Visit (HOSPITAL_COMMUNITY): Payer: Self-pay

## 2023-11-09 ENCOUNTER — Other Ambulatory Visit: Payer: Self-pay | Admitting: Internal Medicine

## 2023-11-09 VITALS — BP 124/96 | HR 100 | Temp 98.7°F | Resp 17 | Ht 65.5 in | Wt 211.3 lb

## 2023-11-09 DIAGNOSIS — Z170 Estrogen receptor positive status [ER+]: Secondary | ICD-10-CM

## 2023-11-09 DIAGNOSIS — Z9221 Personal history of antineoplastic chemotherapy: Secondary | ICD-10-CM | POA: Insufficient documentation

## 2023-11-09 DIAGNOSIS — F319 Bipolar disorder, unspecified: Secondary | ICD-10-CM | POA: Diagnosis not present

## 2023-11-09 DIAGNOSIS — Z8544 Personal history of malignant neoplasm of other female genital organs: Secondary | ICD-10-CM | POA: Diagnosis not present

## 2023-11-09 DIAGNOSIS — Z9012 Acquired absence of left breast and nipple: Secondary | ICD-10-CM | POA: Insufficient documentation

## 2023-11-09 DIAGNOSIS — Z86718 Personal history of other venous thrombosis and embolism: Secondary | ICD-10-CM | POA: Diagnosis not present

## 2023-11-09 DIAGNOSIS — Z923 Personal history of irradiation: Secondary | ICD-10-CM | POA: Diagnosis not present

## 2023-11-09 DIAGNOSIS — Z79811 Long term (current) use of aromatase inhibitors: Secondary | ICD-10-CM | POA: Diagnosis not present

## 2023-11-09 DIAGNOSIS — C50212 Malignant neoplasm of upper-inner quadrant of left female breast: Secondary | ICD-10-CM

## 2023-11-09 DIAGNOSIS — R232 Flushing: Secondary | ICD-10-CM | POA: Diagnosis not present

## 2023-11-09 DIAGNOSIS — R5383 Other fatigue: Secondary | ICD-10-CM | POA: Insufficient documentation

## 2023-11-09 LAB — CBC WITH DIFFERENTIAL (CANCER CENTER ONLY)
Abs Immature Granulocytes: 0.02 10*3/uL (ref 0.00–0.07)
Basophils Absolute: 0 10*3/uL (ref 0.0–0.1)
Basophils Relative: 0 %
Eosinophils Absolute: 0.1 10*3/uL (ref 0.0–0.5)
Eosinophils Relative: 1 %
HCT: 40.2 % (ref 36.0–46.0)
Hemoglobin: 13.4 g/dL (ref 12.0–15.0)
Immature Granulocytes: 0 %
Lymphocytes Relative: 37 %
Lymphs Abs: 3.2 10*3/uL (ref 0.7–4.0)
MCH: 29.3 pg (ref 26.0–34.0)
MCHC: 33.3 g/dL (ref 30.0–36.0)
MCV: 87.8 fL (ref 80.0–100.0)
Monocytes Absolute: 0.7 10*3/uL (ref 0.1–1.0)
Monocytes Relative: 9 %
Neutro Abs: 4.5 10*3/uL (ref 1.7–7.7)
Neutrophils Relative %: 53 %
Platelet Count: 338 10*3/uL (ref 150–400)
RBC: 4.58 MIL/uL (ref 3.87–5.11)
RDW: 13.9 % (ref 11.5–15.5)
WBC Count: 8.5 10*3/uL (ref 4.0–10.5)
nRBC: 0 % (ref 0.0–0.2)

## 2023-11-09 LAB — CMP (CANCER CENTER ONLY)
ALT: 14 U/L (ref 0–44)
AST: 17 U/L (ref 15–41)
Albumin: 4.7 g/dL (ref 3.5–5.0)
Alkaline Phosphatase: 98 U/L (ref 38–126)
Anion gap: 5 (ref 5–15)
BUN: 11 mg/dL (ref 6–20)
CO2: 30 mmol/L (ref 22–32)
Calcium: 10.1 mg/dL (ref 8.9–10.3)
Chloride: 101 mmol/L (ref 98–111)
Creatinine: 0.67 mg/dL (ref 0.44–1.00)
GFR, Estimated: >60 mL/min (ref >60)
Glucose, Bld: 98 mg/dL (ref 70–99)
Potassium: 4.4 mmol/L (ref 3.5–5.1)
Sodium: 136 mmol/L (ref 135–145)
Total Bilirubin: 0.3 mg/dL (ref 0.0–1.2)
Total Protein: 7.9 g/dL (ref 6.5–8.1)

## 2023-11-09 MED ORDER — PROMETHAZINE HCL 12.5 MG PO TABS
12.5000 mg | ORAL_TABLET | Freq: Three times a day (TID) | ORAL | 1 refills | Status: DC | PRN
  Filled 2023-11-09: qty 30, 10d supply, fill #0
  Filled 2023-12-12: qty 30, 10d supply, fill #1

## 2023-11-09 MED ORDER — METHIMAZOLE 5 MG PO TABS
2.5000 mg | ORAL_TABLET | Freq: Every day | ORAL | 1 refills | Status: DC
  Filled 2023-11-09: qty 45, 90d supply, fill #0
  Filled 2024-02-06: qty 45, 90d supply, fill #1

## 2023-11-09 MED ORDER — LETROZOLE 2.5 MG PO TABS
2.5000 mg | ORAL_TABLET | Freq: Every day | ORAL | 3 refills | Status: AC
  Filled 2023-11-09 – 2024-01-19 (×3): qty 90, 90d supply, fill #0
  Filled 2024-04-15: qty 90, 90d supply, fill #1
  Filled 2024-07-12: qty 90, 90d supply, fill #2
  Filled 2024-10-11: qty 90, 90d supply, fill #3

## 2023-11-09 NOTE — Assessment & Plan Note (Signed)
 Breast cancer and vulvar cancers   02/20/2020:PET scan on 02/07/20 following a diagnosis of vulvar cancer showed a 1.7cm left breast mass and mildly hypermetabolic left axillary lymph nodes. Mammogram and [place] on 02/18/20 showed a 3.1cm left breast mass at the 11 o'clock position, palpable on exam, and a single abnormal left axillary lymph node with cortical thickening. Left breast biopsy on 02/20/20 showed invasive and in situ carcinoma in the breast and axilla, grade 3, HER-2 positive (3+), ER/PR+ 95%, Ki67 75%   Treatment plan: 1.  Neoadjuvant chemoradiation for vulvar cancer with Taxol, carboplatin, Herceptin weekly x6 cycles 2. neoadjuvant chemotherapy with TCH Perjeta x3 cycles 3. 07/27/20: Left mastectomy Ezzard Standing): three foci of IDC, 3.7cm, 1.5cm, and 1.2cm, with intermediate to high grade DCIS, clear margins, with 1/2 left axillary lymph nodes positive for carcinoma. 4. Followed by adjuvant radiation therapy to be completed on 10/23/20 5.  Kadcyla maintenance completed 04/06/2021 6.  Adjuvant antiestrogen therapy with anastrozole started 12/01/2020 switched to letrozole 02/23/2021 ------------------------------------------------------------------------------------------------------------------- Current treatment: Letrozole   Letrozole toxicities: 1.  Severe hot flashes   2.  Depression: Sees psychiatry.   Hospitalization 03/31/2023-04/05/2023 seizures with syncope: MRI brain: No acute intracranial process CSF negative, treated with Keppra, mild rhabdomyolysis severe hyperthyroidism/Graves' disease.    She is struggling to get this under control   Left jugular DVT:  anticoagulation October 2021 until March 2023: Ultrasound 11/04/2021: Negative for DVT.    Severe depression Based on all of these issues she is contemplating on applying for disability which is reasonable.   Severe fatigue: I will start her on B12 injections weekly x 4 then monthly. Return to clinic in 1 year for follow-up

## 2023-11-09 NOTE — Telephone Encounter (Signed)
 Spoke with Sandra Miller who started a new job 11/02/23 so had to push out ECL date to May 16th 2025 and pre visit 01/04/2024. I will have paperwork mailed to her, confirmed address. Updates on her symptoms: nausea is daily and bad, no fever, her diarrhea is okay and she wakes up regularly with abdominal pain. I will confirm with Dr Leone Payor dx for [facility]. Sir do you want me to put for chronic diarrhea, nausea & vomiting, or for the sigmoid colon thickening seen on CT scan.

## 2023-11-09 NOTE — Telephone Encounter (Signed)
 Please go ahead and schedule procedures  She has not been seen since 9/24 - so ask her about the nausea, diarrhea, andominal pain and current status and let me know

## 2023-11-09 NOTE — Telephone Encounter (Signed)
 Diagnoses for procedures  Encounter Diagnoses  Name Primary?   Nausea and vomiting, unspecified vomiting type Yes   Abnormal CT scan, sigmoid colon    Nausea and vomiting is for EGD and abnormal Ct for colonoscopy  If still having diarrhea can add that also but sounded like that subsided

## 2023-11-09 NOTE — Progress Notes (Signed)
 Patient Care Team: Marcine Matar, MD as PCP - General (Internal Medicine) Little Ishikawa, MD as PCP - Cardiology (Cardiology) Serena Croissant, MD as Consulting Physician (Hematology and Oncology) Ovidio Kin, MD as Consulting Physician (General Surgery) Paulina Fusi, Servando Snare, RN as Oncology Nurse Navigator (Oncology) Adolphus Birchwood, MD as Consulting Physician (Gynecologic Oncology) Doylene Bode, NP as Nurse Practitioner (Gynecologic Oncology) Antony Blackbird, MD as Consulting Physician (Radiation Oncology) Maryclare Labrador, RN as Registered Nurse Antionette Char, MD as Consulting Physician (Obstetrics and Gynecology)  DIAGNOSIS:  Encounter Diagnosis  Name Primary?   Malignant neoplasm of upper-inner quadrant of left breast in female, estrogen receptor positive (HCC) Yes    SUMMARY OF ONCOLOGIC HISTORY: Oncology History  Vulvar cancer (HCC)  01/29/2020 Initial Diagnosis   Vulvar cancer (HCC)   03/11/2020 - 05/04/2020 Chemotherapy   The patient had dexamethasone (DECADRON) 4 MG tablet, 4 mg (100 % of original dose 4 mg), Oral, Daily, 1 of 1 cycle, Start date: 02/27/2020, End date: 05/05/2020 Dose modification: 4 mg (original dose 4 mg, Cycle 0) palonosetron (ALOXI) injection 0.25 mg, 0.25 mg, Intravenous,  Once, 1 of 1 cycle Administration: 0.25 mg (03/11/2020), 0.25 mg (03/17/2020), 0.25 mg (03/24/2020), 0.25 mg (03/31/2020), 0.25 mg (04/07/2020), 0.25 mg (04/14/2020), 0.25 mg (04/21/2020) CARBOplatin (PARAPLATIN) 300 mg in sodium chloride 0.9 % 250 mL chemo infusion, 300 mg (100 % of original dose 300 mg), Intravenous,  Once, 1 of 1 cycle Dose modification:   (original dose 300 mg, Cycle 1) Administration: 300 mg (03/11/2020), 290 mg (03/17/2020), 300 mg (03/24/2020), 230 mg (03/31/2020), 230 mg (04/07/2020), 230 mg (04/14/2020) PACLitaxel (TAXOL) 108 mg in sodium chloride 0.9 % 250 mL chemo infusion (</= 80mg /m2), 50 mg/m2 = 108 mg, Intravenous,  Once, 1 of 1 cycle Dose modification: 45 mg/m2  (original dose 50 mg/m2, Cycle 1, Reason: Dose not tolerated) Administration: 108 mg (03/11/2020), 108 mg (03/17/2020), 108 mg (03/24/2020), 96 mg (03/31/2020), 96 mg (04/07/2020), 96 mg (04/14/2020) fosaprepitant (EMEND) 150 mg in sodium chloride 0.9 % 145 mL IVPB, 150 mg, Intravenous,  Once, 1 of 1 cycle Administration: 150 mg (03/24/2020), 150 mg (03/31/2020), 150 mg (04/07/2020), 150 mg (04/14/2020) trastuzumab-anns (KANJINTI) 378 mg in sodium chloride 0.9 % 250 mL chemo infusion, 4 mg/kg = 378 mg (100 % of original dose 4 mg/kg), Intravenous,  Once, 1 of 1 cycle Dose modification: 4 mg/kg (original dose 4 mg/kg, Cycle 1, Reason: Other (see comments), Comment: loading dose) Administration: 378 mg (03/11/2020), 189 mg (03/17/2020), 189 mg (03/24/2020), 189 mg (03/31/2020), 189 mg (04/07/2020), 189 mg (04/14/2020), 189 mg (04/21/2020), 189 mg (05/04/2020)  for chemotherapy treatment.    03/16/2020 - 05/08/2020 Radiation Therapy   Patient received 45 Gy in 25 treatments to pelvis Boost received 16.2 Gy in 9 treatments to vulva    Genetic Testing   Patient has genetic testing done for 06/22/2020. Results revealed patient has the following mutation(s): BRIP 1 mutation   Malignant neoplasm of upper-inner quadrant of left breast in female, estrogen receptor positive (HCC)  02/20/2020 Initial Diagnosis   PET scan on 02/07/20 following a diagnosis of vulvar cancer showed a 1.7cm left breast mass and mildly hypermetabolic left axillary lymph nodes. Mammogram and [place] on 02/18/20 showed a 3.1cm left breast mass at the 11 o'clock position, palpable on exam, and a single abnormal left axillary lymph node with cortical thickening. Left breast biopsy on 02/20/20 showed invasive and in situ carcinoma in the breast and axilla, grade 3, HER-2 positive (3+), ER/PR+  95%, Ki67 75%   02/25/2020 Cancer Staging   Staging form: Breast, AJCC 8th Edition - Clinical stage from 02/25/2020: Stage IB (cT2, cN1, cM0, G3, ER+, PR+, HER2+) - Signed by  Serena Croissant, MD on 02/25/2020   05/29/2020 - 05/29/2020 Chemotherapy   The patient had palonosetron (ALOXI) injection 0.25 mg, 0.25 mg, Intravenous,  Once, 1 of 3 cycles Administration: 0.25 mg (05/29/2020) pegfilgrastim-jmdb (FULPHILA) injection 6 mg, 6 mg, Subcutaneous,  Once, 1 of 3 cycles CARBOplatin (PARAPLATIN) 580 mg in sodium chloride 0.9 % 250 mL chemo infusion, 580 mg (100 % of original dose 580 mg), Intravenous,  Once, 1 of 3 cycles Dose modification:   (original dose 580 mg, Cycle 1) Administration: 580 mg (05/29/2020) DOCEtaxel (TAXOTERE) 120 mg in sodium chloride 0.9 % 250 mL chemo infusion, 60 mg/m2 = 120 mg (100 % of original dose 60 mg/m2), Intravenous,  Once, 1 of 3 cycles Dose modification: 60 mg/m2 (original dose 60 mg/m2, Cycle 1, Reason: Provider Judgment), 50 mg/m2 (original dose 60 mg/m2, Cycle 2, Reason: Dose not tolerated) Administration: 120 mg (05/29/2020) fosaprepitant (EMEND) 150 mg in sodium chloride 0.9 % 145 mL IVPB, 150 mg, Intravenous,  Once, 1 of 3 cycles Administration: 150 mg (05/29/2020) pertuzumab (PERJETA) 420 mg in sodium chloride 0.9 % 250 mL chemo infusion, 420 mg (50 % of original dose 840 mg), Intravenous, Once, 1 of 3 cycles Dose modification: 420 mg (original dose 840 mg, Cycle 1, Reason: Provider Judgment) Administration: 420 mg (05/29/2020) trastuzumab-dkst (OGIVRI) 525 mg in sodium chloride 0.9 % 250 mL chemo infusion, 6 mg/kg = 525 mg (75 % of original dose 8 mg/kg), Intravenous,  Once, 1 of 3 cycles Dose modification: 6 mg/kg (original dose 8 mg/kg, Cycle 1, Reason: Provider Judgment) Administration: 525 mg (05/29/2020)  for chemotherapy treatment.    06/22/2020 Genetic Testing   Positive genetic testing: Heterozygous pathogenic variant detected in BRIP1 gene at c.2010dup (p.Glu671*).  Variant of uncertain significance detected in POLD1 at c.532G>A (p.Gly178Arg).  No other pathogenic variants detected in [facility] Common Hereditary Cancers Panel.   The report date is June 22, 2020.   The Common Hereditary Cancers Panel offered by [facility] includes sequencing and/or deletion duplication testing of the following 48 genes: APC, ATM, AXIN2, BARD1, BMPR1A, BRCA1, BRCA2, BRIP1, CDH1, CDK4, CDKN2A (p14ARF), CDKN2A (p16INK4a), CHEK2, CTNNA1, DICER1, EPCAM (Deletion/duplication testing only), GREM1 (promoter region deletion/duplication testing only), KIT, MEN1, MLH1, MSH2, MSH3, MSH6, MUTYH, NBN, NF1, NHTL1, PALB2, PDGFRA, PMS2, POLD1, POLE, PTEN, RAD50, RAD51C, RAD51D, RNF43, SDHB, SDHC, SDHD, SMAD4, SMARCA4. STK11, TP53, TSC1, TSC2, and VHL.  The following genes were evaluated for sequence changes only: SDHA and HOXB13 c.251G>A variant only.   07/27/2020 Surgery   Left mastectomy Ezzard Standing): three foci of IDC, 3.7cm, 1.5cm, and 1.2cm, with intermediate to high grade DCIS, clear margins, with 1/2 left axillary lymph nodes positive for carcinoma.    08/18/2020 - 04/06/2021 Chemotherapy   Patient is on Treatment Plan : BREAST ADO-Trastuzumab Emtansine (Kadcyla) q21d     09/09/2020 - 10/23/2020 Radiation Therapy   Adjuvant radiation     CHIEF COMPLIANT: Surveillance of breast cancer  HISTORY OF PRESENT ILLNESS:   History of Present Illness The patient, with a history of breast cancer and bipolar disorder, presents for a routine follow-up. She reports persistent fatigue, which has not improved with B12 injections. She has been compliant with hormone therapy (letrozole) and has a scheduled mammogram. She expresses concern about the BRCA1 gene, as three first cousins on her father's side  have been diagnosed with breast cancer.  The patient also discusses her recent employment changes and the stress associated with losing her job, starting a new one, and the need for health insurance. She was previously on short-term disability due to her mental health but was denied long-term disability. She is considering hiring an attorney to appeal the decision.  She  also mentions a history of seizures and a recent completion of outpatient therapy for mental health. She expresses frustration with her overall health, feeling like she is constantly battling one health issue after another.     ALLERGIES:  is allergic to dilaudid [hydromorphone hcl], fentanyl, aspirin, depakote [divalproex sodium], and minocycline.  MEDICATIONS:  Current Outpatient Medications  Medication Sig Dispense Refill   famotidine (PEPCID) 40 MG tablet Take 1 tablet (40 mg total) by mouth daily. 90 tablet 3   Fremanezumab-vfrm (AJOVY) 225 MG/1.5ML SOAJ Inject 225 mg into the skin every 30 (thirty) days. 1.5 mL 11   gabapentin (NEURONTIN) 300 MG capsule Take 1 capsule (300 mg) by mouth daily. 30 capsule 1   LamoTRIgine (LAMICTAL XR) 200 MG TB24 24 hour tablet Take 1 tablet (200 mg total) by mouth at bedtime. 90 tablet 4   lisinopril (ZESTRIL) 10 MG tablet Take 1 tablet (10 mg total) by mouth daily. Stop the Lisinopril/HCTZ combination 90 tablet 1   loratadine (CLARITIN REDITABS) 10 MG dissolvable tablet Take 10 mg by mouth daily. OTC     LORazepam (ATIVAN) 1 MG tablet Take 1 tablet (1 mg total) by mouth 3 (three) times daily as needed for anxiety 90 tablet 2   Magnesium 400 MG TABS Take 800 mg by mouth 2 (two) times daily. 120 tablet 1   methimazole (TAPAZOLE) 5 MG tablet Take 0.5 tablets (2.5 mg total) by mouth daily. 45 tablet 1   Multiple Vitamin (MULTIVITAMIN WITH MINERALS) TABS tablet Take 1 tablet by mouth daily.     omeprazole (PRILOSEC) 40 MG capsule Take 1 capsule (40 mg total) by mouth daily before breakfast. 90 capsule 3   potassium chloride SA (KLOR-CON M) 20 MEQ tablet Take 1 tablet (20 mEq) by mouth daily. 90 tablet 1   promethazine (PHENERGAN) 12.5 MG tablet Take 1 tablet (12.5 mg total) by mouth every 8 (eight) hours as needed for nausea or vomiting. 30 tablet 1   propranolol (INDERAL) 10 MG tablet Take 1 tablet by mouth 2 times daily. 180 tablet 1   sertraline (ZOLOFT)  50 MG tablet Take 1.5 tablets (75 mg total) by mouth daily. 45 tablet 1   spironolactone (ALDACTONE) 25 MG tablet Take 0.5 tablets (12.5 mg total) by mouth daily. 60 tablet 1   letrozole (FEMARA) 2.5 MG tablet Take 1 tablet by mouth daily. 90 tablet 3   Ubrogepant (UBRELVY) 50 MG TABS Take 50 mg as needed for migraine, a second dose may be taken at least 2 hours after the initial dose. DO NOT EXCEED 2 doses in 24 hours. (Patient not taking: Reported on 11/09/2023)     No current facility-administered medications for this visit.    PHYSICAL EXAMINATION: ECOG PERFORMANCE STATUS: 1 - Symptomatic but completely ambulatory  Vitals:   11/09/23 1509  BP: (!) 124/96  Pulse: 100  Resp: 17  Temp: 98.7 F (37.1 C)  SpO2: 100%   Filed Weights   11/09/23 1509  Weight: 211 lb 4.8 oz (95.8 kg)      LABORATORY DATA:  I have reviewed the data as listed    Latest Ref Rng &  Units 11/09/2023    2:41 PM 11/01/2023   10:33 AM 08/29/2023    6:22 AM  CMP  Glucose 70 - 99 mg/dL 98  678  938   BUN 6 - 20 mg/dL 11  10  11    Creatinine 0.44 - 1.00 mg/dL 1.01  7.51  0.25   Sodium 135 - 145 mmol/L 136  137  135   Potassium 3.5 - 5.1 mmol/L 4.4  5.0  3.6   Chloride 98 - 111 mmol/L 101  98  100   CO2 22 - 32 mmol/L 30  22  23    Calcium 8.9 - 10.3 mg/dL 85.2  77.8  24.2   Total Protein 6.5 - 8.1 g/dL 7.9   8.3   Total Bilirubin 0.0 - 1.2 mg/dL 0.3   1.1   Alkaline Phos 38 - 126 U/L 98   71   AST 15 - 41 U/L 17   22   ALT 0 - 44 U/L 14   15     Lab Results  Component Value Date   WBC 8.5 11/09/2023   HGB 13.4 11/09/2023   HCT 40.2 11/09/2023   MCV 87.8 11/09/2023   PLT 338 11/09/2023   NEUTROABS 4.5 11/09/2023    ASSESSMENT & PLAN:  Malignant neoplasm of upper-inner quadrant of left breast in female, estrogen receptor positive (HCC) Breast cancer and vulvar cancers   02/20/2020:PET scan on 02/07/20 following a diagnosis of vulvar cancer showed a 1.7cm left breast mass and mildly hypermetabolic  left axillary lymph nodes. Mammogram and [place] on 02/18/20 showed a 3.1cm left breast mass at the 11 o'clock position, palpable on exam, and a single abnormal left axillary lymph node with cortical thickening. Left breast biopsy on 02/20/20 showed invasive and in situ carcinoma in the breast and axilla, grade 3, HER-2 positive (3+), ER/PR+ 95%, Ki67 75%   Treatment plan: 1.  Neoadjuvant chemoradiation for vulvar cancer with Taxol, carboplatin, Herceptin weekly x6 cycles 2. neoadjuvant chemotherapy with TCH Perjeta x3 cycles 3. 07/27/20: Left mastectomy Ezzard Standing): three foci of IDC, 3.7cm, 1.5cm, and 1.2cm, with intermediate to high grade DCIS, clear margins, with 1/2 left axillary lymph nodes positive for carcinoma. 4. Followed by adjuvant radiation therapy to be completed on 10/23/20 5.  Kadcyla maintenance completed 04/06/2021 6.  Adjuvant antiestrogen therapy with anastrozole started 12/01/2020 switched to letrozole 02/23/2021 ------------------------------------------------------------------------------------------------------------------- Current treatment: Letrozole   Letrozole toxicities: 1.  Severe hot flashes   2.  Depression: Sees psychiatry.   Hospitalization 03/31/2023-04/05/2023 seizures with syncope: MRI brain: No acute intracranial process CSF negative, treated with Keppra, mild rhabdomyolysis severe hyperthyroidism/Graves' disease.    She is struggling to get this under control   Left jugular DVT:  anticoagulation October 2021 until March 2023: Ultrasound 11/04/2021: Negative for DVT.    Severe depression Based on all of these issues she is contemplating on applying for disability which is reasonable.  Unfortunately it got denied and she is thinking about hiring an Pensions consultant.  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   Severe fatigue: We tried B12 injections but they did not help.  Therefore we will discontinue at this time. Return to clinic in 1 year for  follow-up ------------------------------------- Assessment and Plan Assessment & Plan Breast cancer surveillance On letrozole with BRCA1 mutation, increasing breast cancer risk. Awaiting mammogram results. Family history suggests genetic predisposition. Letrozole planned for seven years if tolerated. - Continue letrozole therapy for another year. - Monitor mammogram results and  inform her immediately if there are concerns. - Schedule annual follow-up for breast cancer surveillance.  Survivorship care Transitioning to survivorship care post-cancer treatment, focusing on health maintenance. - Schedule survivorship care appointment to discuss post-treatment health maintenance.  Fatigue Persistent fatigue despite ineffective B12 injections, impacting daily activities and work. Discontinue B12 injections. - Cancel B12 injection for today.  Mental health concerns Bipolar disorder and depression exacerbated by job loss and financial instability. Outpatient therapy beneficial; continuation encouraged. - Encourage continuation of outpatient therapy for mental health support.  Insurance and financial stress Experiencing financial stress due to lack of insurance coverage, paying out of pocket for medications. New insurance expected March 27th. Considering hiring an attorney for disability benefits due to health conditions. - Advise her to explore legal assistance for disability benefits. - Encourage managing financial stress and seeking support if needed.      No orders of the defined types were placed in this encounter.  The patient has a good understanding of the overall plan. she agrees with it. she will call with any problems that may develop before the next visit here. Total time spent: 30 mins including face to face time and time spent for planning, charting and co-ordination of care   Tamsen Meek, MD 11/09/23

## 2023-11-10 ENCOUNTER — Other Ambulatory Visit: Payer: Self-pay

## 2023-11-10 ENCOUNTER — Other Ambulatory Visit (HOSPITAL_COMMUNITY): Payer: Self-pay

## 2023-11-10 ENCOUNTER — Telehealth: Payer: Self-pay | Admitting: Hematology and Oncology

## 2023-11-10 NOTE — Telephone Encounter (Signed)
 Scheduled appointment per 3/6 los. Left VM with appointment details.

## 2023-11-13 ENCOUNTER — Encounter: Payer: Self-pay | Admitting: Hematology and Oncology

## 2023-11-13 ENCOUNTER — Other Ambulatory Visit: Payer: Self-pay

## 2023-11-13 ENCOUNTER — Other Ambulatory Visit (HOSPITAL_COMMUNITY): Payer: Self-pay

## 2023-11-13 MED ORDER — TOPIRAMATE 25 MG PO TABS
25.0000 mg | ORAL_TABLET | Freq: Every day | ORAL | 0 refills | Status: DC
  Filled 2023-11-13 – 2023-11-14 (×2): qty 30, 30d supply, fill #0

## 2023-11-14 ENCOUNTER — Other Ambulatory Visit: Payer: Self-pay

## 2023-11-14 ENCOUNTER — Other Ambulatory Visit (HOSPITAL_COMMUNITY): Payer: Self-pay

## 2023-11-14 ENCOUNTER — Other Ambulatory Visit: Payer: Self-pay | Admitting: Internal Medicine

## 2023-11-14 ENCOUNTER — Encounter: Payer: Self-pay | Admitting: Hematology and Oncology

## 2023-11-15 ENCOUNTER — Other Ambulatory Visit (HOSPITAL_COMMUNITY): Payer: Self-pay

## 2023-11-16 ENCOUNTER — Other Ambulatory Visit: Payer: Self-pay

## 2023-11-16 ENCOUNTER — Other Ambulatory Visit (HOSPITAL_COMMUNITY): Payer: Self-pay

## 2023-11-16 ENCOUNTER — Encounter: Payer: Self-pay | Admitting: Hematology and Oncology

## 2023-11-17 ENCOUNTER — Other Ambulatory Visit: Payer: Self-pay

## 2023-11-17 ENCOUNTER — Ambulatory Visit: Payer: Self-pay

## 2023-11-18 ENCOUNTER — Other Ambulatory Visit (HOSPITAL_COMMUNITY): Payer: Self-pay

## 2023-11-20 ENCOUNTER — Encounter (HOSPITAL_COMMUNITY): Payer: Self-pay

## 2023-11-20 ENCOUNTER — Encounter: Payer: Self-pay | Admitting: Hematology and Oncology

## 2023-11-20 ENCOUNTER — Other Ambulatory Visit (HOSPITAL_COMMUNITY): Payer: Self-pay

## 2023-11-20 ENCOUNTER — Other Ambulatory Visit: Payer: Self-pay

## 2023-11-21 ENCOUNTER — Other Ambulatory Visit: Payer: Self-pay

## 2023-11-21 ENCOUNTER — Other Ambulatory Visit (HOSPITAL_COMMUNITY): Payer: Self-pay

## 2023-11-21 ENCOUNTER — Encounter: Payer: Self-pay | Admitting: Hematology and Oncology

## 2023-11-23 ENCOUNTER — Encounter: Payer: Self-pay | Admitting: Hematology and Oncology

## 2023-11-24 ENCOUNTER — Ambulatory Visit (HOSPITAL_COMMUNITY)
Admission: RE | Admit: 2023-11-24 | Discharge: 2023-11-24 | Disposition: A | Discharge status: Home / Self Care | Payer: Self-pay | Source: Ambulatory Visit | Attending: Cardiology | Admitting: Cardiology

## 2023-11-24 DIAGNOSIS — E782 Mixed hyperlipidemia: Secondary | ICD-10-CM | POA: Insufficient documentation

## 2023-11-29 ENCOUNTER — Encounter: Payer: Self-pay | Admitting: *Deleted

## 2023-12-05 ENCOUNTER — Other Ambulatory Visit: Payer: Self-pay | Admitting: Physician Assistant

## 2023-12-05 ENCOUNTER — Other Ambulatory Visit (HOSPITAL_COMMUNITY): Payer: Self-pay

## 2023-12-05 ENCOUNTER — Encounter: Payer: Self-pay | Admitting: Hematology and Oncology

## 2023-12-05 DIAGNOSIS — R1013 Epigastric pain: Secondary | ICD-10-CM

## 2023-12-05 MED ORDER — GABAPENTIN 300 MG PO CAPS
300.0000 mg | ORAL_CAPSULE | Freq: Every day | ORAL | 1 refills | Status: DC
  Filled 2023-12-05: qty 30, 30d supply, fill #0
  Filled 2024-01-03: qty 30, 30d supply, fill #1

## 2023-12-06 ENCOUNTER — Other Ambulatory Visit (HOSPITAL_COMMUNITY): Payer: Self-pay

## 2023-12-06 ENCOUNTER — Encounter (HOSPITAL_COMMUNITY): Payer: Self-pay

## 2023-12-07 ENCOUNTER — Other Ambulatory Visit: Payer: Self-pay

## 2023-12-07 ENCOUNTER — Other Ambulatory Visit: Payer: Self-pay | Admitting: Internal Medicine

## 2023-12-07 ENCOUNTER — Encounter: Payer: Self-pay | Admitting: Hematology and Oncology

## 2023-12-07 ENCOUNTER — Other Ambulatory Visit (HOSPITAL_COMMUNITY): Payer: Self-pay

## 2023-12-08 ENCOUNTER — Other Ambulatory Visit: Payer: Self-pay

## 2023-12-08 ENCOUNTER — Other Ambulatory Visit (HOSPITAL_BASED_OUTPATIENT_CLINIC_OR_DEPARTMENT_OTHER): Payer: Self-pay

## 2023-12-08 ENCOUNTER — Encounter: Payer: Self-pay | Admitting: Hematology and Oncology

## 2023-12-08 ENCOUNTER — Encounter: Payer: Self-pay | Admitting: Gynecologic Oncology

## 2023-12-08 ENCOUNTER — Other Ambulatory Visit (HOSPITAL_COMMUNITY): Payer: Self-pay

## 2023-12-08 MED ORDER — POTASSIUM CHLORIDE CRYS ER 20 MEQ PO TBCR
20.0000 meq | EXTENDED_RELEASE_TABLET | Freq: Every day | ORAL | 2 refills | Status: DC
  Filled 2023-12-08 – 2023-12-12 (×2): qty 30, 30d supply, fill #0
  Filled 2024-01-06: qty 30, 30d supply, fill #1
  Filled 2024-02-05: qty 30, 30d supply, fill #2

## 2023-12-09 ENCOUNTER — Other Ambulatory Visit (HOSPITAL_COMMUNITY): Payer: Self-pay

## 2023-12-11 ENCOUNTER — Ambulatory Visit: Payer: BC Managed Care – PPO | Attending: Internal Medicine | Admitting: Internal Medicine

## 2023-12-12 ENCOUNTER — Other Ambulatory Visit (HOSPITAL_COMMUNITY): Payer: Self-pay

## 2023-12-12 ENCOUNTER — Other Ambulatory Visit: Payer: Self-pay

## 2023-12-13 ENCOUNTER — Other Ambulatory Visit (HOSPITAL_COMMUNITY): Payer: Self-pay

## 2023-12-14 ENCOUNTER — Other Ambulatory Visit: Payer: Self-pay

## 2023-12-14 ENCOUNTER — Other Ambulatory Visit (HOSPITAL_COMMUNITY): Payer: Self-pay

## 2023-12-14 ENCOUNTER — Inpatient Hospital Stay
Payer: No Typology Code available for payment source | Attending: Hematology and Oncology | Admitting: Gynecologic Oncology

## 2023-12-14 ENCOUNTER — Ambulatory Visit (HOSPITAL_BASED_OUTPATIENT_CLINIC_OR_DEPARTMENT_OTHER): Payer: Medicaid Other

## 2023-12-14 ENCOUNTER — Encounter: Payer: Self-pay | Admitting: Gynecologic Oncology

## 2023-12-14 ENCOUNTER — Encounter (HOSPITAL_COMMUNITY): Payer: Self-pay

## 2023-12-14 VITALS — BP 144/82 | HR 93 | Temp 99.1°F | Resp 19 | Wt 219.8 lb

## 2023-12-14 DIAGNOSIS — Z853 Personal history of malignant neoplasm of breast: Secondary | ICD-10-CM | POA: Diagnosis not present

## 2023-12-14 DIAGNOSIS — Z9011 Acquired absence of right breast and nipple: Secondary | ICD-10-CM | POA: Diagnosis not present

## 2023-12-14 DIAGNOSIS — Z9221 Personal history of antineoplastic chemotherapy: Secondary | ICD-10-CM | POA: Diagnosis not present

## 2023-12-14 DIAGNOSIS — B3731 Acute candidiasis of vulva and vagina: Secondary | ICD-10-CM | POA: Insufficient documentation

## 2023-12-14 DIAGNOSIS — Z923 Personal history of irradiation: Secondary | ICD-10-CM | POA: Insufficient documentation

## 2023-12-14 DIAGNOSIS — Z8544 Personal history of malignant neoplasm of other female genital organs: Secondary | ICD-10-CM | POA: Insufficient documentation

## 2023-12-14 DIAGNOSIS — Z148 Genetic carrier of other disease: Secondary | ICD-10-CM | POA: Diagnosis not present

## 2023-12-14 DIAGNOSIS — Z1509 Genetic susceptibility to other malignant neoplasm: Secondary | ICD-10-CM | POA: Insufficient documentation

## 2023-12-14 DIAGNOSIS — Z08 Encounter for follow-up examination after completed treatment for malignant neoplasm: Secondary | ICD-10-CM | POA: Diagnosis not present

## 2023-12-14 DIAGNOSIS — C519 Malignant neoplasm of vulva, unspecified: Secondary | ICD-10-CM

## 2023-12-14 DIAGNOSIS — B379 Candidiasis, unspecified: Secondary | ICD-10-CM

## 2023-12-14 DIAGNOSIS — Z9079 Acquired absence of other genital organ(s): Secondary | ICD-10-CM | POA: Diagnosis not present

## 2023-12-14 MED ORDER — FLUCONAZOLE 150 MG PO TABS
150.0000 mg | ORAL_TABLET | Freq: Every day | ORAL | 1 refills | Status: DC
  Filled 2023-12-14: qty 1, 1d supply, fill #0
  Filled 2024-01-03: qty 1, 1d supply, fill #1

## 2023-12-14 NOTE — Progress Notes (Signed)
 Gynecologic Oncology Return Clinic Visit  12/14/23  Reason for Visit: surveillance  Treatment History: Oncology History  Vulvar cancer (HCC)  01/29/2020 Initial Diagnosis   Vulvar cancer (HCC)   03/11/2020 - 05/04/2020 Chemotherapy   The patient had dexamethasone (DECADRON) 4 MG tablet, 4 mg (100 % of original dose 4 mg), Oral, Daily, 1 of 1 cycle, Start date: 02/27/2020, End date: 05/05/2020 Dose modification: 4 mg (original dose 4 mg, Cycle 0) palonosetron (ALOXI) injection 0.25 mg, 0.25 mg, Intravenous,  Once, 1 of 1 cycle Administration: 0.25 mg (03/11/2020), 0.25 mg (03/17/2020), 0.25 mg (03/24/2020), 0.25 mg (03/31/2020), 0.25 mg (04/07/2020), 0.25 mg (04/14/2020), 0.25 mg (04/21/2020) CARBOplatin (PARAPLATIN) 300 mg in sodium chloride 0.9 % 250 mL chemo infusion, 300 mg (100 % of original dose 300 mg), Intravenous,  Once, 1 of 1 cycle Dose modification:   (original dose 300 mg, Cycle 1) Administration: 300 mg (03/11/2020), 290 mg (03/17/2020), 300 mg (03/24/2020), 230 mg (03/31/2020), 230 mg (04/07/2020), 230 mg (04/14/2020) PACLitaxel (TAXOL) 108 mg in sodium chloride 0.9 % 250 mL chemo infusion (</= 80mg /m2), 50 mg/m2 = 108 mg, Intravenous,  Once, 1 of 1 cycle Dose modification: 45 mg/m2 (original dose 50 mg/m2, Cycle 1, Reason: Dose not tolerated) Administration: 108 mg (03/11/2020), 108 mg (03/17/2020), 108 mg (03/24/2020), 96 mg (03/31/2020), 96 mg (04/07/2020), 96 mg (04/14/2020) fosaprepitant (EMEND) 150 mg in sodium chloride 0.9 % 145 mL IVPB, 150 mg, Intravenous,  Once, 1 of 1 cycle Administration: 150 mg (03/24/2020), 150 mg (03/31/2020), 150 mg (04/07/2020), 150 mg (04/14/2020) trastuzumab-anns (KANJINTI) 378 mg in sodium chloride 0.9 % 250 mL chemo infusion, 4 mg/kg = 378 mg (100 % of original dose 4 mg/kg), Intravenous,  Once, 1 of 1 cycle Dose modification: 4 mg/kg (original dose 4 mg/kg, Cycle 1, Reason: Other (see comments), Comment: loading dose) Administration: 378 mg (03/11/2020), 189 mg (03/17/2020),  189 mg (03/24/2020), 189 mg (03/31/2020), 189 mg (04/07/2020), 189 mg (04/14/2020), 189 mg (04/21/2020), 189 mg (05/04/2020)  for chemotherapy treatment.    03/16/2020 - 05/08/2020 Radiation Therapy   Patient received 45 Gy in 25 treatments to pelvis Boost received 16.2 Gy in 9 treatments to vulva    Genetic Testing   Patient has genetic testing done for 06/22/2020. Results revealed patient has the following mutation(s): BRIP 1 mutation   Malignant neoplasm of upper-inner quadrant of left breast in female, estrogen receptor positive (HCC)  02/20/2020 Initial Diagnosis   PET scan on 02/07/20 following a diagnosis of vulvar cancer showed a 1.7cm left breast mass and mildly hypermetabolic left axillary lymph nodes. Mammogram and [place] on 02/18/20 showed a 3.1cm left breast mass at the 11 o'clock position, palpable on exam, and a single abnormal left axillary lymph node with cortical thickening. Left breast biopsy on 02/20/20 showed invasive and in situ carcinoma in the breast and axilla, grade 3, HER-2 positive (3+), ER/PR+ 95%, Ki67 75%   02/25/2020 Cancer Staging   Staging form: Breast, AJCC 8th Edition - Clinical stage from 02/25/2020: Stage IB (cT2, cN1, cM0, G3, ER+, PR+, HER2+) - Signed by Serena Croissant, MD on 02/25/2020   05/29/2020 - 05/29/2020 Chemotherapy   The patient had palonosetron (ALOXI) injection 0.25 mg, 0.25 mg, Intravenous,  Once, 1 of 3 cycles Administration: 0.25 mg (05/29/2020) pegfilgrastim-jmdb (FULPHILA) injection 6 mg, 6 mg, Subcutaneous,  Once, 1 of 3 cycles CARBOplatin (PARAPLATIN) 580 mg in sodium chloride 0.9 % 250 mL chemo infusion, 580 mg (100 % of original dose 580 mg), Intravenous,  Once, 1  of 3 cycles Dose modification:   (original dose 580 mg, Cycle 1) Administration: 580 mg (05/29/2020) DOCEtaxel (TAXOTERE) 120 mg in sodium chloride 0.9 % 250 mL chemo infusion, 60 mg/m2 = 120 mg (100 % of original dose 60 mg/m2), Intravenous,  Once, 1 of 3 cycles Dose modification: 60 mg/m2  (original dose 60 mg/m2, Cycle 1, Reason: Provider Judgment), 50 mg/m2 (original dose 60 mg/m2, Cycle 2, Reason: Dose not tolerated) Administration: 120 mg (05/29/2020) fosaprepitant (EMEND) 150 mg in sodium chloride 0.9 % 145 mL IVPB, 150 mg, Intravenous,  Once, 1 of 3 cycles Administration: 150 mg (05/29/2020) pertuzumab (PERJETA) 420 mg in sodium chloride 0.9 % 250 mL chemo infusion, 420 mg (50 % of original dose 840 mg), Intravenous, Once, 1 of 3 cycles Dose modification: 420 mg (original dose 840 mg, Cycle 1, Reason: Provider Judgment) Administration: 420 mg (05/29/2020) trastuzumab-dkst (OGIVRI) 525 mg in sodium chloride 0.9 % 250 mL chemo infusion, 6 mg/kg = 525 mg (75 % of original dose 8 mg/kg), Intravenous,  Once, 1 of 3 cycles Dose modification: 6 mg/kg (original dose 8 mg/kg, Cycle 1, Reason: Provider Judgment) Administration: 525 mg (05/29/2020)  for chemotherapy treatment.    06/22/2020 Genetic Testing   Positive genetic testing: Heterozygous pathogenic variant detected in BRIP1 gene at c.2010dup (p.Glu671*).  Variant of uncertain significance detected in POLD1 at c.532G>A (p.Gly178Arg).  No other pathogenic variants detected in [facility] Common Hereditary Cancers Panel.  The report date is June 22, 2020.   The Common Hereditary Cancers Panel offered by [facility] includes sequencing and/or deletion duplication testing of the following 48 genes: APC, ATM, AXIN2, BARD1, BMPR1A, BRCA1, BRCA2, BRIP1, CDH1, CDK4, CDKN2A (p14ARF), CDKN2A (p16INK4a), CHEK2, CTNNA1, DICER1, EPCAM (Deletion/duplication testing only), GREM1 (promoter region deletion/duplication testing only), KIT, MEN1, MLH1, MSH2, MSH3, MSH6, MUTYH, NBN, NF1, NHTL1, PALB2, PDGFRA, PMS2, POLD1, POLE, PTEN, RAD50, RAD51C, RAD51D, RNF43, SDHB, SDHC, SDHD, SMAD4, SMARCA4. STK11, TP53, TSC1, TSC2, and VHL.  The following genes were evaluated for sequence changes only: SDHA and HOXB13 c.251G>A variant only.   07/27/2020 Surgery   Left  mastectomy Ezzard Standing): three foci of IDC, 3.7cm, 1.5cm, and 1.2cm, with intermediate to high grade DCIS, clear margins, with 1/2 left axillary lymph nodes positive for carcinoma.    08/18/2020 - 04/06/2021 Chemotherapy   Patient is on Treatment Plan : BREAST ADO-Trastuzumab Emtansine (Kadcyla) q21d     09/09/2020 - 10/23/2020 Radiation Therapy   Adjuvant radiation    Ms Grasiela Jonsson is a 48 year old parous woman with a history of vulvar cancer who was seen in consultation at the request of Dr Pamelia Hoit for evaluation of a deleterious mutation in BRIP1.   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  At the time of seeking consultation for sentinel lymph node biopsy with Dr. Reyne Dumas at [facility], it was determined that she was not a good candidate for primary radical vulvectomy due to the close proximity of the lesion to her anal sphincter.  Therefore she was treated with definitive external beam radiation to the lower pelvis and vulvar tissues.  Radiation dosing was between 03/16/20 through 05/08/20. It consisted of IMRT to the pelvis (45 Gy) and IMRT boost to the vulva (16.2 Gy).   Simultaneous to her diagnosis of vulvar cancer was a diagnosis of ER/PR positive breast cancer on the left.  This was stage II with positive lymph  node.  She underwent treatment with Adriamycin and Taxotere followed by left mastectomy, trastuzumab infusion for HER-2 positive lesion, and was planned for chest wall radiation beginning in the new year of 2022.   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   Screening ultrasound of the pelvis on 11/06/2020 revealed a uterus measuring 9.1 x 3.3 x 4.3 cm with an endometrial thickness of 6 mm and bilateral ovaries which were normal in size and  appearance.   On 05/18/21 she underwent robotic assisted total hysterectomy, BSO and vulvar biopsy. Intraoperative findings were significant for complete clinical response at vulva, normal appearing tubes and ovaries, normal appearing fibroid uterus. Surgery was uncomplicated.  Final pathology revealed benign tubes , ovaries, uterus. VIN 1 at vulva.   Since surgery she has done well with no major complaints  Interval History: Patient reports overall doing okay although had a number of seizures last summer and was out of work for 8 months.  Was also recently diagnosed with and treated for pneumonia, has finished antibiotics now and has noticed some vaginal/vulvar itching.  She denies any discharge or bleeding.  Past Medical/Surgical History: Past Medical History:  Diagnosis Date   Arthritis    Bipolar 1 disorder (HCC)    Cancer (HCC)    vulva, and breast   Complication of anesthesia    wakes up during procedures   Depression    GAD (generalized anxiety disorder)    Genital HSV    currently per pt  no break out 03-22-2016    GERD (gastroesophageal reflux disease)    Graves disease    Hiatal hernia    History of cervical dysplasia    2012 laser ablation   History of esophageal dilatation    for dysphasia -- x2 dilated   History of gastric ulcer    History of Helicobacter pylori infection    remote hx   History of hidradenitis suppurativa    "gets all over body intermittantly"     History of hypertension    no issue since stopped drinking alcohol 2014   History of kidney stones    History of panic attacks    History of radiation therapy 03/16/2020-05/08/2020   vulva  Dr Antony Blackbird   History of radiation therapy 09/09/2020-10/23/2020   left chest wall/left SCV   Dr Antony Blackbird   Hypertension    Iron deficiency anemia    Left ureteral stone    OCD (obsessive compulsive disorder)    PONV (postoperative nausea and vomiting)    Pre-diabetes    PTSD (post-traumatic stress disorder)     Recovering alcoholic in remission (HCC)    since 2014   RLS (restless legs syndrome)    Smokers' cough (HCC)    Thyroid disease    Urgency of urination    Yeast infection involving the vagina and surrounding area    secondary to taking antibiotic    Past Surgical History:  Procedure Laterality Date   CESAREAN SECTION  1995   w/  Bilateral Tubal Ligation   COLONOSCOPY  last one 08-09-2013   CYSTOSCOPY W/ URETERAL STENT PLACEMENT Left 03/29/2016   Procedure: CYSTOSCOPY WITH STENT REPLACEMENT;  Surgeon: Hildred Laser, MD;  Location: [facility];  Service: Urology;  Laterality: Left;   CYSTOSCOPY WITH RETROGRADE PYELOGRAM, URETEROSCOPY AND STENT PLACEMENT Left 03/08/2016   Procedure: CYSTOSCOPY WITH  LEFT  RETROGRADE PYELOGRAM, AND STENT PLACEMENT;  Surgeon: Hildred Laser, MD;  Location: [facility];  Service: Urology;  Laterality: Left;   CYSTOSCOPY/RETROGRADE/URETEROSCOPY/STONE EXTRACTION WITH BASKET Left 03/29/2016   Procedure: CYSTOSCOPY/RETROGRADE/URETEROSCOPY/STONE EXTRACTION WITH BASKET;  Surgeon: Hildred Laser, MD;  Location: [facility];  Service: Urology;  Laterality: Left;   ENDOMETRIAL ABLATION W/ NOVASURE  04-01-2010   ESOPHAGOGASTRODUODENOSCOPY  last one 08-09-2013   KNEE ARTHROSCOPY Left as teen   LASER ABLATION OF THE CERVIX  2012 approx   MASTECTOMY WITH AXILLARY LYMPH NODE DISSECTION Left 07/27/2020   Procedure: LEFT MASTECTOMY WITH LEFT RADIOACTIVE SEED GUIDED TARGETED AXILLARY LYMPH NODE DISSECTION;  Surgeon: Ovidio Kin, MD;  Location: [facility];  Service: General;  Laterality: Left;   PORT-A-CATH REMOVAL Right 05/18/2021   Procedure: REMOVAL PORT-A-CATH;  Surgeon: Manus Rudd, MD;  Location: [facility];  Service: General;  Laterality: Right;   PORTACATH PLACEMENT Right 03/06/2020   Procedure: INSERTION PORT-A-CATH WITH ULTRASOUND GUIDANCE;  Surgeon: Ovidio Kin, MD;  Location: [facility];  Service: General;  Laterality:  Right;   ROBOTIC ASSISTED TOTAL HYSTERECTOMY WITH BILATERAL SALPINGO OOPHERECTOMY N/A 05/18/2021   Procedure: XI ROBOTIC ASSISTED TOTAL HYSTERECTOMY WITH BILATERAL SALPINGO OOPHORECTOMY;  Surgeon: Adolphus Birchwood, MD;  Location: [facility];  Service: Gynecology;  Laterality: N/A;   TRANSTHORACIC ECHOCARDIOGRAM  05-19-2006   lvsf normal, ef 55-65%, there was mild flattening of the interventricular septum during diastoli/  RV size at upper limits normal   TUBAL LIGATION     VULVA /PERINEUM BIOPSY N/A 05/18/2021   Procedure: VULVAR BIOPSY;  Surgeon: Adolphus Birchwood, MD;  Location: [facility];  Service: Gynecology;  Laterality: N/A;   WISDOM TOOTH EXTRACTION  age 21 's    Family History  Problem Relation Age of Onset   Heart disease Mother    Depression Mother    Anxiety disorder Mother    Heart disease Father    Lung cancer Father        d. 6   Alcohol abuse Father    Drug abuse Brother    Alcohol abuse Brother    Drug abuse Brother    ADD / ADHD Brother    Colon polyps Brother    Anxiety disorder Maternal Aunt    Depression Maternal Aunt    Kidney disease Maternal Uncle    Lung cancer Paternal Uncle 63   Diabetes Maternal Grandfather    Diabetes Paternal Grandmother    Cancer Paternal Grandfather        "stomach"   Cirrhosis Cousin        alcoholic   Cancer Cousin        maternal; ovarian cancer or other "female" cancer?   Throat cancer Cousin        paternal; dx 57s   Lung cancer Cousin        paternal; dx 65s   Breast cancer Cousin        paternal   Breast cancer Cousin        paternal    Social History   Socioeconomic History   Marital status: Single    Spouse name: Not on file   Number of children: Not on file   Years of education: Not on file   Highest education level: Associate degree: academic program  Occupational History   Not on file  Tobacco Use   Smoking status: Former    Current packs/day: 0.50    Types: Cigarettes   Smokeless tobacco: Never   Tobacco comments:  Recently started a smoking cessation class. Quit 03/31/23  Vaping Use   Vaping status: Never Used  Substance and Sexual Activity   Alcohol use: Not Currently   Drug use: Not Currently    Types: Marijuana    Comment: none in about 1 year   Sexual activity: Yes    Partners: Male    Birth control/protection: Surgical  Other Topics Concern   Not on file  Social History Narrative   Former [facility] patient.      Was on disability at one point.   Return to the workforce.  40 hours a week at [employer], 10 hours a week on the weekends at [employer] in [place], [employer] at night 10 to 12 hours a week.      Has grown children, she lives alone with a pet, continues to smoke no alcohol or drug use at this time      History of EtOH abuse and THC use.         Social Drivers of Corporate investment banker Strain: Low Risk  (10/02/2023)   Overall Financial Resource Strain (CARDIA)    Difficulty of Paying Living Expenses: Not very hard  Recent Concern: Financial Resource Strain - Medium Risk (08/01/2023)   Overall Financial Resource Strain (CARDIA)    Difficulty of Paying Living Expenses: Somewhat hard  Food Insecurity: No Food Insecurity (10/02/2023)   Hunger Vital Sign    Worried About Running Out of Food in the Last Year: Never true    Ran Out of Food in the Last Year: Never true  Recent Concern: Food Insecurity - Food Insecurity Present (09/28/2023)   Received from [facility]   Hunger Vital Sign    Worried About Running Out of Food in the Last Year: Sometimes true    Ran Out of Food in the Last Year: Patient declined  Transportation Needs: Unmet Transportation Needs (10/02/2023)   PRAPARE - Administrator, Civil Service (Medical): Yes    Lack of Transportation (Non-Medical): No  Physical Activity: Inactive (10/02/2023)   Exercise Vital Sign    Days of Exercise per Week: 0 days    Minutes of Exercise per Session: 0 min  Stress: Stress Concern Present  (10/02/2023)   [employer] of Occupational Health - Occupational Stress Questionnaire    Feeling of Stress : Rather much  Social Connections: Moderately Isolated (10/02/2023)   Social Connection and Isolation Panel [NHANES]    Frequency of Communication with Friends and Family: More than three times a week    Frequency of Social Gatherings with Friends and Family: Never    Attends Religious Services: 1 to 4 times per year    Active Member of [employer] or Organizations: No    Attends Banker Meetings: Never    Marital Status: Never married    Current Medications:  Current Outpatient Medications:    fluconazole (DIFLUCAN) 150 MG tablet, Take 1 tablet (150 mg total) by mouth daily., Disp: 1 tablet, Rfl: 1   famotidine (PEPCID) 40 MG tablet, Take 1 tablet (40 mg total) by mouth daily., Disp: 90 tablet, Rfl: 3   Fremanezumab-vfrm (AJOVY) 225 MG/1.5ML SOAJ, Inject 225 mg into the skin every 30 (thirty) days., Disp: 1.5 mL, Rfl: 11   gabapentin (NEURONTIN) 300 MG capsule, Take 1 capsule (300 mg) by mouth daily., Disp: 30 capsule, Rfl: 1   LamoTRIgine (LAMICTAL XR) 200 MG TB24 24 hour tablet, Take 1 tablet (200 mg total) by mouth at  bedtime., Disp: 90 tablet, Rfl: 4   letrozole (FEMARA) 2.5 MG tablet, Take 1 tablet by mouth daily., Disp: 90 tablet, Rfl: 3   lisinopril (ZESTRIL) 10 MG tablet, Take 1 tablet (10 mg total) by mouth daily. Stop the Lisinopril/HCTZ combination, Disp: 90 tablet, Rfl: 1   loratadine (CLARITIN REDITABS) 10 MG dissolvable tablet, Take 10 mg by mouth daily. OTC, Disp: , Rfl:    LORazepam (ATIVAN) 1 MG tablet, Take 1 tablet (1 mg total) by mouth 3 (three) times daily as needed for anxiety, Disp: 90 tablet, Rfl: 2   Magnesium 400 MG TABS, Take 800 mg by mouth 2 (two) times daily., Disp: 120 tablet, Rfl: 1   methimazole (TAPAZOLE) 5 MG tablet, Take 0.5 tablets (2.5 mg total) by mouth daily., Disp: 45 tablet, Rfl: 1   Multiple Vitamin (MULTIVITAMIN WITH MINERALS)  TABS tablet, Take 1 tablet by mouth daily., Disp: , Rfl:    omeprazole (PRILOSEC) 40 MG capsule, Take 1 capsule (40 mg total) by mouth daily before breakfast., Disp: 90 capsule, Rfl: 3   potassium chloride SA (KLOR-CON M) 20 MEQ tablet, Take 1 tablet (20 mEq total) by mouth daily., Disp: 30 tablet, Rfl: 2   promethazine (PHENERGAN) 12.5 MG tablet, Take 1 tablet (12.5 mg total) by mouth every 8 (eight) hours as needed for nausea or vomiting., Disp: 30 tablet, Rfl: 1   propranolol (INDERAL) 10 MG tablet, Take 1 tablet by mouth 2 times daily., Disp: 180 tablet, Rfl: 1   sertraline (ZOLOFT) 50 MG tablet, Take 1.5 tablets (75 mg total) by mouth daily., Disp: 45 tablet, Rfl: 1   spironolactone (ALDACTONE) 25 MG tablet, Take 0.5 tablets (12.5 mg total) by mouth daily., Disp: 60 tablet, Rfl: 1   topiramate (TOPAMAX) 25 MG tablet, Take 1 tablet (25 mg total) by mouth daily., Disp: 30 tablet, Rfl: 0   Ubrogepant (UBRELVY) 50 MG TABS, Take 50 mg as needed for migraine, a second dose may be taken at least 2 hours after the initial dose. DO NOT EXCEED 2 doses in 24 hours. (Patient not taking: Reported on 11/09/2023), Disp: , Rfl:   Review of Systems: + fatigue, hot flashes Denies appetite changes, fevers, chills, unexplained weight changes. Denies hearing loss, neck lumps or masses, mouth sores, ringing in ears or voice changes. Denies cough or wheezing.  Denies shortness of breath. Denies chest pain or palpitations. Denies leg swelling. Denies abdominal distention, pain, blood in stools, constipation, diarrhea, nausea, vomiting, or early satiety. Denies pain with intercourse, dysuria, frequency, hematuria or incontinence. Denies pelvic pain, vaginal bleeding or vaginal discharge.   Denies joint pain, back pain or muscle pain/cramps. Denies itching, rash, or wounds. Denies dizziness, headaches, numbness or seizures. Denies swollen lymph nodes or glands, denies easy bruising or bleeding. Denies anxiety,  depression, confusion, or decreased concentration.  Physical Exam: BP (!) 142/75 (BP Location: Left Arm, Patient Position: Sitting)   Pulse 93   Temp 99.1 F (37.3 C) (Oral)   Resp 19   Wt 219 lb 12.8 oz (99.7 kg)   SpO2 100%   BMI 36.02 kg/m  General: Alert, oriented, no acute distress. HEENT: Normocephalic, atraumatic, sclera anicteric. Chest: Clear to auscultation bilaterally.  No wheezes or rhonchi. Cardiovascular: Regular rate and rhythm, no murmurs. Abdomen: soft, nontender.  Normoactive bowel sounds.  No masses or hepatosplenomegaly appreciated.   Extremities: Grossly normal range of motion.  Warm, well perfused.  No edema bilaterally. Lymphatics: No cervical, supraclavicular, or inguinal adenopathy. GU: External female genitalia with  changes consistent with history of radiation. No visible lesions, hyperkeratosis, or ulcerations.  Mild hypopigmentation along the perineal body, unchanged.  No acetowhite lesions noted on vulvoscopy. On bimanual exam, no masses or nodularity.  Laboratory & Radiologic Studies: None new  Assessment & Plan: Sandra Miller is a 48 y.o. woman with a history of stage IB vulvar cancer s/p definitive radiation completed in 05/2020, s/p diagnosis of HR positive left breast cancer, and deleterious mutation in BRIP1 who underwent risk reducing hysterectomy and BSO in 05/2021.  Presents today for surveillance visit.   Patient is overall doing well and is NED on vulvar exam today.  Since she is now approximately 3.5 years from completing adjuvant therapy for her vulvar cancer, we will transition to visits every 6 months.  I will see her for her next visit in 6 months and then we can restart alternating visits with radiation oncology until 5 years from completing treatment.    In terms of her deleterious mutation in BRIP 1 conferring increased hereditary risk of ovarian cancer, she is s/p robotic assisted total hysterectomy with BSO for risk reduction in  September, 2022.   The patient endorses symptoms consistent with Candida infection after recent antibiotics.  Prescription sent for 1 dose of Diflucan to her pharmacy.  22 minutes of total time was spent for this patient encounter, including preparation, face-to-face counseling with the patient and coordination of care, and documentation of the encounter.  Eugene Garnet, MD  Division of Gynecologic Oncology  Department of Obstetrics and Gynecology  [facility]

## 2023-12-14 NOTE — Patient Instructions (Signed)
 It was good to see you today.  I do not see or feel any evidence of cancer recurrence on your exam.  I will see you for follow-up in 6 months.  As always, if you develop any new and concerning symptoms before your next visit, please call to see me sooner.

## 2023-12-16 ENCOUNTER — Encounter (HOSPITAL_COMMUNITY): Payer: Self-pay

## 2023-12-16 ENCOUNTER — Other Ambulatory Visit (HOSPITAL_COMMUNITY): Payer: Self-pay | Admitting: Psychiatry

## 2023-12-16 ENCOUNTER — Other Ambulatory Visit (HOSPITAL_COMMUNITY): Payer: Self-pay

## 2023-12-16 ENCOUNTER — Encounter: Payer: Self-pay | Admitting: Hematology and Oncology

## 2023-12-16 DIAGNOSIS — F319 Bipolar disorder, unspecified: Secondary | ICD-10-CM

## 2023-12-16 DIAGNOSIS — F411 Generalized anxiety disorder: Secondary | ICD-10-CM

## 2023-12-17 ENCOUNTER — Encounter: Payer: Self-pay | Admitting: Neurology

## 2023-12-17 ENCOUNTER — Other Ambulatory Visit: Payer: Self-pay | Admitting: Neurology

## 2023-12-17 ENCOUNTER — Encounter (HOSPITAL_COMMUNITY): Payer: Self-pay

## 2023-12-18 ENCOUNTER — Other Ambulatory Visit (HOSPITAL_COMMUNITY): Payer: Self-pay

## 2023-12-18 ENCOUNTER — Encounter: Payer: Self-pay | Admitting: Hematology and Oncology

## 2023-12-18 ENCOUNTER — Telehealth: Payer: Self-pay

## 2023-12-18 NOTE — Telephone Encounter (Signed)
 Pharmacy Patient Advocate Encounter   Received notification from Physician's Office that prior authorization for AJOVY (fremanezumab-vfrm) injection 225MG /1.5ML auto-injectors is required/requested.   Insurance verification completed.   The patient is insured through [facility] .   Per test claim: PA required; PA submitted to above mentioned insurance via CoverMyMeds Key/confirmation #/EOC [id number] Status is pending

## 2023-12-18 NOTE — Telephone Encounter (Signed)
 Can someone please do an Urgent PA for this pt for her Ajovy.

## 2023-12-19 ENCOUNTER — Telehealth (HOSPITAL_COMMUNITY): Payer: Medicaid Other | Admitting: Psychiatry

## 2023-12-19 ENCOUNTER — Other Ambulatory Visit (HOSPITAL_COMMUNITY): Payer: Self-pay

## 2023-12-19 ENCOUNTER — Other Ambulatory Visit: Payer: Self-pay

## 2023-12-19 ENCOUNTER — Encounter: Payer: Self-pay | Admitting: Hematology and Oncology

## 2023-12-19 NOTE — Telephone Encounter (Signed)
 Thanks!  I notified the patient.

## 2023-12-19 NOTE — Telephone Encounter (Signed)
 Pharmacy Patient Advocate Encounter  Received notification from [facility] that Prior Authorization for AJOVY (fremanezumab-vfrm) injection 225MG /1.5ML auto-injectors has been APPROVED from 12/18/2023 to 03/11/2024. Unable to obtain price due to refill too soon rejection, last fill date 12/19/2023 next available fill date5/04/2024   PA #/Case ID/Reference #: PA Case ID #: [id number]

## 2023-12-21 ENCOUNTER — Other Ambulatory Visit (HOSPITAL_COMMUNITY): Payer: Self-pay

## 2023-12-21 MED ORDER — UBRELVY 50 MG PO TABS
ORAL_TABLET | ORAL | 1 refills | Status: AC
  Filled 2023-12-21 – 2023-12-22 (×2): qty 10, 30d supply, fill #0

## 2023-12-22 ENCOUNTER — Telehealth (HOSPITAL_BASED_OUTPATIENT_CLINIC_OR_DEPARTMENT_OTHER): Admitting: Psychiatry

## 2023-12-22 ENCOUNTER — Other Ambulatory Visit (HOSPITAL_COMMUNITY): Payer: Self-pay

## 2023-12-22 ENCOUNTER — Encounter (HOSPITAL_COMMUNITY): Payer: Self-pay | Admitting: Psychiatry

## 2023-12-22 ENCOUNTER — Other Ambulatory Visit: Payer: Self-pay

## 2023-12-22 ENCOUNTER — Encounter: Payer: Self-pay | Admitting: Hematology and Oncology

## 2023-12-22 VITALS — Wt 219.0 lb

## 2023-12-22 DIAGNOSIS — F5101 Primary insomnia: Secondary | ICD-10-CM

## 2023-12-22 DIAGNOSIS — F411 Generalized anxiety disorder: Secondary | ICD-10-CM

## 2023-12-22 DIAGNOSIS — F319 Bipolar disorder, unspecified: Secondary | ICD-10-CM

## 2023-12-22 MED ORDER — LORAZEPAM 1 MG PO TABS
1.0000 mg | ORAL_TABLET | Freq: Three times a day (TID) | ORAL | 2 refills | Status: DC | PRN
  Filled 2023-12-22: qty 90, 30d supply, fill #0
  Filled 2024-01-27: qty 90, 30d supply, fill #1
  Filled 2024-03-02: qty 90, 30d supply, fill #2

## 2023-12-22 MED ORDER — TOPIRAMATE 25 MG PO TABS
25.0000 mg | ORAL_TABLET | Freq: Every day | ORAL | 2 refills | Status: DC
Start: 2023-12-22 — End: 2024-03-25
  Filled 2023-12-22: qty 30, 30d supply, fill #0
  Filled 2024-01-16 – 2024-01-19 (×2): qty 30, 30d supply, fill #1
  Filled 2024-02-15: qty 30, 30d supply, fill #2

## 2023-12-22 MED ORDER — SERTRALINE HCL 50 MG PO TABS
75.0000 mg | ORAL_TABLET | Freq: Every day | ORAL | 2 refills | Status: DC
  Filled 2023-12-22: qty 45, 30d supply, fill #0
  Filled 2024-01-17 – 2024-01-19 (×2): qty 45, 30d supply, fill #1
  Filled 2024-02-15: qty 45, 30d supply, fill #2

## 2023-12-22 NOTE — Progress Notes (Signed)
  [facility] MD Virtual Progress Note   Patient Location: Home Provider Location: Home Office  I connect with patient by video and verified that I am speaking with correct person by using two identifiers. I discussed the limitations of e

## 2023-12-23 ENCOUNTER — Other Ambulatory Visit (HOSPITAL_BASED_OUTPATIENT_CLINIC_OR_DEPARTMENT_OTHER): Payer: Self-pay

## 2023-12-23 ENCOUNTER — Other Ambulatory Visit (HOSPITAL_COMMUNITY): Payer: Self-pay

## 2023-12-25 ENCOUNTER — Other Ambulatory Visit (HOSPITAL_COMMUNITY): Payer: Self-pay

## 2023-12-26 ENCOUNTER — Other Ambulatory Visit (HOSPITAL_COMMUNITY): Payer: Self-pay

## 2023-12-27 ENCOUNTER — Other Ambulatory Visit (HOSPITAL_COMMUNITY): Payer: Self-pay

## 2023-12-29 ENCOUNTER — Telehealth: Payer: Self-pay

## 2023-12-29 ENCOUNTER — Other Ambulatory Visit: Payer: Self-pay

## 2023-12-29 ENCOUNTER — Other Ambulatory Visit (HOSPITAL_COMMUNITY): Payer: Self-pay

## 2023-12-29 ENCOUNTER — Encounter: Payer: Self-pay | Admitting: Hematology and Oncology

## 2023-12-29 NOTE — Telephone Encounter (Signed)
 Pharmacy Patient Advocate Encounter  Received notification from [facility] that Prior Authorization for Ubrelvy  50MG  tablets has been APPROVED from 12-29-2023 to 03-22-2024   PA #/Case ID/Reference #: [id number]

## 2023-12-29 NOTE — Telephone Encounter (Signed)
 Pharmacy Patient Advocate Encounter   Received notification from CoverMyMeds that prior authorization for Ubrelvy  50MG  tablets is required/requested.   Insurance verification completed.   The patient is insured through [facility] .   Per test claim: PA required; PA submitted to above mentioned insurance via CoverMyMeds Key/confirmation #/[id number] Status is pending

## 2024-01-02 ENCOUNTER — Encounter: Payer: Self-pay | Admitting: Hematology and Oncology

## 2024-01-03 ENCOUNTER — Other Ambulatory Visit: Payer: Self-pay

## 2024-01-04 ENCOUNTER — Ambulatory Visit

## 2024-01-04 ENCOUNTER — Other Ambulatory Visit: Payer: Self-pay

## 2024-01-04 VITALS — Ht 66.0 in | Wt 220.0 lb

## 2024-01-04 DIAGNOSIS — R112 Nausea with vomiting, unspecified: Secondary | ICD-10-CM

## 2024-01-04 DIAGNOSIS — R935 Abnormal findings on diagnostic imaging of other abdominal regions, including retroperitoneum: Secondary | ICD-10-CM

## 2024-01-04 MED ORDER — POLYETHYLENE GLYCOL 3350 17 GM/SCOOP PO POWD
238.0000 g | Freq: Once | ORAL | 0 refills | Status: AC
Start: 2024-01-04 — End: 2024-01-06
  Filled 2024-01-04: qty 238, 1d supply, fill #0

## 2024-01-04 NOTE — Progress Notes (Signed)
 Denies allergies to eggs or soy products. Denies complication of anesthesia or sedation. Denies use of weight loss medication. Denies use of O2.   Emmi instructions given for colonoscopy.

## 2024-01-05 ENCOUNTER — Other Ambulatory Visit (HOSPITAL_COMMUNITY): Payer: Self-pay

## 2024-01-05 ENCOUNTER — Encounter: Payer: Self-pay | Admitting: Hematology and Oncology

## 2024-01-05 ENCOUNTER — Other Ambulatory Visit: Payer: Self-pay

## 2024-01-06 ENCOUNTER — Other Ambulatory Visit (HOSPITAL_COMMUNITY): Payer: Self-pay

## 2024-01-06 ENCOUNTER — Encounter: Payer: Self-pay | Admitting: Hematology and Oncology

## 2024-01-08 ENCOUNTER — Other Ambulatory Visit: Payer: Self-pay

## 2024-01-09 ENCOUNTER — Encounter: Payer: Self-pay | Admitting: Internal Medicine

## 2024-01-09 ENCOUNTER — Encounter: Payer: Self-pay | Admitting: Hematology and Oncology

## 2024-01-11 ENCOUNTER — Other Ambulatory Visit: Payer: Self-pay

## 2024-01-11 ENCOUNTER — Telehealth (INDEPENDENT_AMBULATORY_CARE_PROVIDER_SITE_OTHER): Payer: PRIVATE HEALTH INSURANCE | Admitting: Neurology

## 2024-01-11 ENCOUNTER — Encounter (HOSPITAL_COMMUNITY): Payer: Self-pay

## 2024-01-11 ENCOUNTER — Encounter: Payer: Self-pay | Admitting: Hematology and Oncology

## 2024-01-11 ENCOUNTER — Telehealth: Payer: Self-pay | Admitting: Neurology

## 2024-01-11 ENCOUNTER — Other Ambulatory Visit (HOSPITAL_COMMUNITY): Payer: Self-pay

## 2024-01-11 ENCOUNTER — Encounter: Payer: Self-pay | Admitting: Neurology

## 2024-01-11 ENCOUNTER — Telehealth: Payer: Self-pay | Admitting: *Deleted

## 2024-01-11 DIAGNOSIS — G43711 Chronic migraine without aura, intractable, with status migrainosus: Secondary | ICD-10-CM

## 2024-01-11 MED ORDER — AJOVY 225 MG/1.5ML ~~LOC~~ SOAJ
225.0000 mg | SUBCUTANEOUS | 11 refills | Status: AC
  Filled 2024-01-11 – 2024-01-27 (×2): qty 1.5, 30d supply, fill #0
  Filled 2024-03-02: qty 1.5, 30d supply, fill #1
  Filled 2024-04-10 – 2024-04-12 (×2): qty 1.5, 30d supply, fill #2
  Filled 2024-05-24: qty 1.5, 30d supply, fill #3
  Filled 2024-06-21 – 2024-06-24 (×2): qty 1.5, 30d supply, fill #4
  Filled 2024-07-31 – 2024-08-05 (×2): qty 1.5, 30d supply, fill #5
  Filled 2024-08-24 – 2024-10-07 (×5): qty 1.5, 30d supply, fill #6

## 2024-01-11 MED ORDER — LAMOTRIGINE ER 200 MG PO TB24
200.0000 mg | ORAL_TABLET | Freq: Every evening | ORAL | 4 refills | Status: AC
  Filled 2024-01-11: qty 30, 30d supply, fill #0
  Filled 2024-02-15: qty 30, 30d supply, fill #1
  Filled 2024-03-13: qty 30, 30d supply, fill #2
  Filled 2024-04-10: qty 30, 30d supply, fill #3
  Filled 2024-05-10: qty 30, 30d supply, fill #4
  Filled 2024-06-07: qty 30, 30d supply, fill #5
  Filled 2024-07-05: qty 30, 30d supply, fill #6
  Filled 2024-08-03: qty 30, 30d supply, fill #7
  Filled 2024-08-24 – 2024-09-07 (×3): qty 30, 30d supply, fill #8
  Filled 2024-10-07: qty 90, 90d supply, fill #9

## 2024-01-11 NOTE — Progress Notes (Signed)
 [facility]    Provider:  Dr Tresia Fruit Requesting Provider: Lawrance Presume, MD Primary Care Provider:  Lawrance Presume, MD  CC:  headaches, recent provoked seizure  Virtual Visit via Video Note  I connected with Sandra Miller on 01/11/24 at  2:00 PM EDT by a video enabled telemedicine application and verified that I am speaking with the correct person using two identifiers.  Location: Patient: home Provider: office   I discussed the limitations of evaluation and management by telemedicine and the availability of in person appointments. The patient expressed understanding and agreed to proceed.    Follow Up Instructions:    I discussed the assessment and treatment plan with the patient. The patient was provided an opportunity to ask questions and all were answered. The patient agreed with the plan and demonstrated an understanding of the instructions.   The patient was advised to call back or seek an in-person evaluation if the symptoms worsen or if the condition fails to improve as anticipated.  I provided 15 minutes of non-face-to-face time during this encounter.   Glory Larsen, MD  01/11/2024: No more events or seizures. She felt weird the other day maybe was anxiety. Migraines have been better. She has been on Ajovy  for 3 months and the 3rd shot she felt improved and she doesn't have headaches. She takes topiramate  at night unsure if helping her sleep she was told it would help her sleep. She is off of the Keppra . We will contonie the Ajovy  and continue all meds and office call for follow up   No other focal neurologic deficits, associated symptoms, inciting events or modifiable factors. Patient complains of symptoms per HPI as well as the following symptoms: insomnia . Pertinent negatives and positives per HPI. All others negative   08/22/2023: Snores heavily. Wakes up mornings feeling unrefreshed. She spoke to her oncologist and since chemo she  has no enerfy. She was started on B12 shots weekly then monthly since November. She is on 150mg  of maictal and heading to 200. I will send in a 200mg  pill XR and start decreasing keppra . SInce this was a provoked seizure and has been 6 months seizure free and is on 2 seizure medications(on gabapentin  and lamictal  and is not tirating down on the keppra  but she will stay on lamictal  and gabapentin ) she can be released to drive.  The seizure was provoke din the setting of vomiting for days, hypomagnesemia, elevated WBCs 25, long-term eeg in the hospital was negative, believe this was provoked and the metabolic abnormalities are now normal so she is on and will stay on gabapentin  and lamictal . She will start with short drives around the block with daughter for a few weeks and in January if feeling comfortable by 14th can start driving independently.  Having migraines again.  She has 15 migraine days a month lasting 8-24 hours and daily headaches for > 6 months, pulsating/pounding/throbbing, nausea, photo/phono/osmophobia, no medication overuse, no aura, hurts to move, moderate to severe and affecting life. Discussed sleep apnea can consider in the future if the cgrp not effective but int he past the AJOVY  was tremendouns and she had > 75% improvement in migraine freq and severity. And when < 8 migraines she will contact us  to prescribe ubrelvy . Consider sleep eval, highly recommend. No further seizure activity. No other focal neurologic deficits, associated symptoms, inciting events or modifiable factors.   Medications tried > 3 months that can be used in migraine management: lamictal  IR  and ER, Keppra , Topiramate , lisonopril, propranolol , Tylenol , baclofen ,voltaren , benadryl , prozac , gabapentin , ibupren, ketoralc, labetalol , lisinopril , magnesium , methimazole ,robaxin , reglan , medrol , zyprexa , prednisone , compazine , propranolol ,has been on topamx also  topiramate  contraindicated due to kidney stones, trazodone ,  effexor ,amitriptyline and nortrityline, imitrex and maxalt(palpitations), aimovig contraindicated due to constipation, Ajovy (worked but insurance problems), ubrelvy , emgality  Patient complains of symptoms per HPI as well as the following symptoms: none . Pertinent negatives and positives per HPI. All others negative   05/29/2023: She has been fatigues since being I the hospital. Magnesium  was still low on Dr. Anselmo Bast labs last month, she had repeat at mephrology. She has an appointment with Dr. Lincoln Renshaw (advised her I didn;t see a more recent serum magnesium  discuss with Dr. Lincoln Renshaw). Fatigued. No seizures since she ws last seen. Was started on Lamictal  for her mood and discussed with Dr. Carlos Chesterfield to keep her on Lamictal  and titrate off of Keppra  as lamictal  is also a seizure medication. Dr. Gudina gave her b12 injections, She also see cardiology next month. 2 AEDs may be causing fatigue. If we can get lamictal  up to 100mg  bid we can start to decrease keppra . Next week she start taking 4 lamictal  once a day. She is scared to decrease keppra  due to her anxiety told her we could start decreasing keppra  at this time while we titrate the lamictal  but she is scared and I completely understand.   04/26/2023: Migraine patient who is here for follow up from hospital. has Herpes simplex virus (HSV) infection; Tobacco abuse; GERD (gastroesophageal reflux disease); Functional dyspepsia; Genital warts; Neck pain on left side; Loss of weight; Hidradenitis suppurativa of left axilla; Nephrolithiasis; Obstructive pyelonephritis; Normocytic anemia; Restless leg syndrome; History of ELISA positive for HSV; Nausea and vomiting; Seasonal allergic rhinitis; Essential hypertension; Vulvar ulceration; Vulvar cancer (HCC); Malignant neoplasm of upper-inner quadrant of left breast in female, estrogen receptor positive (HCC); Port-A-Cath in place; Colitis; Hypokalemia; Dehydration; Mutation in BRIP1 gene; Hyperlipidemia; Prediabetes;  Hypomagnesemia; Bipolar 1 disorder (HCC); Obesity (BMI 30.0-34.9); Hyperthyroidism; Pain in left knee; Sebaceous cyst; Chronic migraine without aura without status migrainosus, not intractable; Pain in right knee; Bilateral primary osteoarthritis of knee; Migraine with aura and without status migrainosus, not intractable; Provoked seizures (HCC); Respiratory depression; Status epilepticus (HCC); Prolonged QT interval; Graves disease; Dizziness; Fatigue; Mood disorder (HCC); and Chronic migraine without aura, with intractable migraine, so stated, with status migrainosus on their problem list.  She has a hx of bipolar 1, cancer s/p chemo, radiation therapy.  Tobacco abuse, depression, anxiety, migraines, panic attacks, PTSD, obesity, gastric ulcer, Graves' disease, nephrolithiasis, hypertension, prediabetes was seen in the hospital for possible seizures.  I reviewed discharge summaries from Dr. Osborne Blazer and Dr. Lurlene Salon: She was found minimally responsive on the floor by her daughter on March 31, 2019 4 in the morning, patient had had intermittent nausea and vomiting but worse over the last week with poor p.o. intake, and worsening of her chronic headaches migraines.  No previous history of seizures, witnessed seizures and 2 additional seizures with EMS.  In the ED had rightward gaze and remain minimally responsible with an additional 2 seizures in the ER.  She was loaded with Keppra  and treated with Ativan  labs were significant for very elevated white blood cells 25.8, potassium 3.4, magnesium  0.8, Phos 5.4, calcium  10.6, glucose 316, she was admitted and intubated.  MRI was negative for any acute processes, negative CSF, overnight EEG showed mild to moderate diffuse encephalopathy no seizure or epileptiform discharges seen in long-term EEG was discontinued  on July 27.  Diagnosed with seizure versus syncopal seizure from hypotension from dehydration from frequent nausea, vomiting and poor oral air intake and  abnormalities of electrolytes with extremely elevated white blood cell count, hypomagnesemia and hypokalemia.  Possibly provoked seizure.  She was seen again several days later 04/10/2023 in the hospital she had a prolonged QT interval, likely due to severe electrolyte abnormality and her Zyprexa  was discontinued.  She had profound hypomagnesemia and hypokalemia.  Her thyroid  was normal.  Her migraines were stable.  Hospitalization was for questionable seizure versus syncope, she was admitted and seen by neurology, EEG was inconclusive, MRI of the brain was nonacute, was placed on Keppra  750 mg p.o. twice daily.    I reviewed Dr. Anselmo Bast recent notes, patient with recurrent vomiting, going on for months, smoking marijuana 3 to 4 months ago, no new meds that she knew that could be causing it, has follow-up with GI 06/02/2023.  She was also referred to nephrology.  She also sees Dr. Carlos Chesterfield and psychiatry.  She still has electrolyte abnormalities as of 818 and her magnesium  came back low at 0.6 despite taking mag oxide.  Dr. Lincoln Renshaw suspected it was high doses of omeprazole .  She is here with sister. The Ajovy  was not approved. She has not taken Ajovy , we will hold off on Ajovy . And the Ubrelvy . It has been months since she took the Ajovy . No seizures since July. Don't think she should drive until she has been stabilized medically, likely provoked seizure due to electrolyte imbalances, dehydration, vomiting, nausea, diarrhead. Continue the Keppra  and no driving but once medical problems are stabilized we can order an eeg in the office and it negative I think she could drive again and keep her on keppra  for 6 months. 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 When she had chemo she was able to work. She was started on lamictal  and I will contact Dr. Arfeen to ask if we can titrate to 200 bid and then decrease and titrate off the keppra . I warned her about the keppra  and causing  irritability. Her sister is very concerned about her mental health. She is taking magnesium  and vitamin D  and following closely with pcp, referrals to gastro and nephro. Lamictal  and keppra  and magnesium  are used for migraines, at this time will hold off on anything and monitor migraines.  Patient complains of symptoms per HPI as well as the following symptoms: seizure . Pertinent negatives and positives per HPI. All others negative   10/03/2022: Patient was started on Ajovy . MRi showed partially empty sella which could have been incidental, no papilledema seen on exam. Here for follow up. Ajovy  has worked tremendously well, 100%, last few been sick and she has had some migraines but otherwise the Ajovy  has been tremendous now she has flu-like symptoms. Likely illness that's causing the headaches. Ajovy  has helped tremendously. She has ubrelvy  as needed. We will prescribe and follow up in one year. No new brain imaging in epic review.  Patient complains of symptoms per HPI as well as the following symptoms: flu . Pertinent negatives and positives per HPI. All others negative  Reviewed labs:     Latest Ref Rng & Units 11/09/2023    2:41 PM 11/01/2023   10:33 AM 08/29/2023    6:22 AM  CMP  Glucose 70 - 99 mg/dL 98  161  096   BUN 6 - 20 mg/dL 11  10  11   Creatinine 0.44 - 1.00 mg/dL 7.82  9.56  2.13   Sodium 135 - 145 mmol/L 136  137  135   Potassium 3.5 - 5.1 mmol/L 4.4  5.0  3.6   Chloride 98 - 111 mmol/L 101  98  100   CO2 22 - 32 mmol/L 30  22  23    Calcium  8.9 - 10.3 mg/dL 08.6  57.8  46.9   Total Protein 6.5 - 8.1 g/dL 7.9   8.3   Total Bilirubin 0.0 - 1.2 mg/dL 0.3   1.1   Alkaline Phos 38 - 126 U/L 98   71   AST 15 - 41 U/L 17   22   ALT 0 - 44 U/L 14   15       Latest Ref Rng & Units 11/09/2023    2:41 PM 08/29/2023    6:22 AM 06/02/2023    2:55 PM  CBC  WBC 4.0 - 10.5 K/uL 8.5  7.8  8.8   Hemoglobin 12.0 - 15.0 g/dL 62.9  52.8  41.3   Hematocrit 36.0 - 46.0 % 40.2  50.7  40.5    Platelets 150 - 400 K/uL 338  226  339.0       HPI:  Sandra Miller is a 48 y.o. female here as requested by Lawrance Presume, MD for headaches. She has had a headache for a week ago. Migraines started years ago. PMHx bipolar, cancer, depression, gad, graves, kidney stones, htn, panic attacks, radiation therapy, ocd, pre-diates, ocd, PTSD, RLS, recovering alcoholic, breast and vulvar cancer, Pulsating/pounding/throbbing,photo/phonophobia, nausea, hurts to move, nausea, sister has migraines and daughter. Whole head, no hearing changes, sleep helps, can radiate to the back of the head. Vision is blurry all the time. She doesn't see a difference with glasses. Daily headaches. At least 10 migraine days a month that can last 24 hours and be moderate to severe although has had 4 days of migraines in a row. No aura. No other focal neurologic deficits, associated symptoms, inciting events or modifiable factors.   Reviewed notes, labs and imaging from outside physicians, which showed:  06/28/2022 tsh and ft4 nml    TECHNIQUE: Multidetector CT imaging of the head was performed using the standard protocol during bolus administration of intravenous contrast. Multiplanar CT image reconstructions and MIPs were obtained to evaluate the vascular anatomy.   RADIATION DOSE REDUCTION: This exam was performed according to the departmental dose-optimization program which includes automated exposure control, adjustment of the mA and/or kV according to patient size and/or use of iterative reconstruction technique.   CONTRAST:  75mL OMNIPAQUE  IOHEXOL  350 MG/ML SOLN   COMPARISON:  None Available.   FINDINGS: CT HEAD   Brain: Cerebral volume within normal limits for patient age.   No evidence for acute intracranial hemorrhage. No findings to suggest acute large vessel territory infarct. No mass lesion, midline shift, or mass effect. Ventricles are normal in size without evidence for hydrocephalus.  No extra-axial fluid collection identified. Partially empty sella noted.   Vascular: No hyperdense vessel identified.   Skull: Scalp soft tissues demonstrate no acute abnormality. Calvarium intact.   Sinuses/Orbits: Globes and orbital soft tissues within normal limits.   Minimal ethmoidal sinus disease. Paranasal sinuses are otherwise clear. No mastoid effusion.   CTA HEAD   Anterior circulation: Both internal carotid arteries widely patent to the termini without stenosis. A1 segments widely patent. Normal anterior communicating artery complex. Both anterior cerebral arteries widely patent to their  distal aspects without stenosis. No M1 stenosis or occlusion. Normal MCA bifurcations. Distal MCA branches well perfused and symmetric.   Posterior circulation: Both V4 segments patent to the vertebrobasilar junction without stenosis. Both PICA origins patent and normal. Basilar widely patent to its distal aspect without stenosis. Superior cerebellar arteries patent bilaterally. Both PCAs primarily supplied via the basilar and are well perfused to there distal aspects.   Venous sinuses: Patent allowing for timing the contrast bolus.   Anatomic variants: None significant.  No intracranial aneurysm.   Review of the MIP images confirms the above findings.   CT VENOGRAM   Normal enhancement seen throughout the superior sagittal sinus to the torcula. Transverse and sigmoid sinuses are patent as are the jugular bulbs and visualized proximal internal jugular veins. Right transverse sinus dominant. Straight sinus, vein of Galen, and internal cerebral veins are patent. No visible cortical vein thrombosis. No dural sinus thrombosis.   IMPRESSION: 1. Normal CTA of the head. No large vessel occlusion, hemodynamically significant stenosis, or other acute vascular abnormality. No aneurysm. 2. Negative CT venogram.  Evidence for dural sinus thrombosis. 3. No other acute intracranial  abnormality. 4. Partially empty sella. Finding is nonspecific, but can be seen in the setting of idiopathic intracranial hypertension.  Review of Systems: Patient complains of symptoms per HPI as well as the following symptoms migraines. Pertinent negatives and positives per HPI. All others negative.   Social History   Socioeconomic History   Marital status: Single    Spouse name: Not on file   Number of children: Not on file   Years of education: Not on file   Highest education level: Associate degree: academic program  Occupational History   Not on file  Tobacco Use   Smoking status: Every Day    Current packs/day: 0.50    Types: Cigarettes   Smokeless tobacco: Never   Tobacco comments:    Recently started a smoking cessation class. Quit 03/31/23  Vaping Use   Vaping status: Never Used  Substance and Sexual Activity   Alcohol use: Not Currently   Drug use: Not Currently    Types: Marijuana    Comment: none in about 1 year   Sexual activity: Yes    Partners: Male    Birth control/protection: Surgical  Other Topics Concern   Not on file  Social History Narrative   Former [facility] patient.      Was on disability at one point.   Return to the workforce.  40 hours a week at [employer], 10 hours a week on the weekends at [employer] in [place], [employer] at night 10 to 12 hours a week.      Has grown children, she lives alone with a pet, continues to smoke no alcohol or drug use at this time      History of EtOH abuse and THC use.         Social Drivers of Corporate investment banker Strain: Low Risk  (10/02/2023)   Overall Financial Resource Strain (CARDIA)    Difficulty of Paying Living Expenses: Not very hard  Recent Concern: Financial Resource Strain - Medium Risk (08/01/2023)   Overall Financial Resource Strain (CARDIA)    Difficulty of Paying Living Expenses: Somewhat hard  Food Insecurity: No Food Insecurity (10/02/2023)   Hunger Vital Sign     Worried About Running Out of Food in the Last Year: Never true    Ran Out of Food in the Last Year: Never  true  Recent Concern: Food Insecurity - Food Insecurity Present (09/28/2023)   [facility]   Hunger Vital Sign    Worried About Running Out of Food in the Last Year: Sometimes true    Ran Out of Food in the Last Year: Patient declined  Transportation Needs: Unmet Transportation Needs (10/02/2023)   PRAPARE - Administrator, Civil Service (Medical): Yes    Lack of Transportation (Non-Medical): No  Physical Activity: Inactive (10/02/2023)   Exercise Vital Sign    Days of Exercise per Week: 0 days    Minutes of Exercise per Session: 0 min  Stress: Stress Concern Present (10/02/2023)   [employer] of Occupational Health - Occupational Stress Questionnaire    Feeling of Stress : Rather much  Social Connections: Moderately Isolated (10/02/2023)   Social Connection and Isolation Panel [NHANES]    Frequency of Communication with Friends and Family: More than three times a week    Frequency of Social Gatherings with Friends and Family: Never    Attends Religious Services: 1 to 4 times per year    Active Member of [employer] or Organizations: No    Attends Banker Meetings: Never    Marital Status: Never married  Intimate Partner Violence: Not At Risk (09/28/2023)   Received from [facility]   HITS    Over the last 12 months how often did your partner physically hurt you?: Never    Over the last 12 months how often did your partner insult you or talk down to you?: Never    Over the last 12 months how often did your partner threaten you with physical harm?: Never    Over the last 12 months how often did your partner scream or curse at you?: Never    Family History  Problem Relation Age of Onset   Heart disease Mother    Depression Mother    Anxiety disorder Mother    Heart disease Father    Lung cancer Father        d. 58   Alcohol abuse Father     Drug abuse Brother    Alcohol abuse Brother    Drug abuse Brother    ADD / ADHD Brother    Colon polyps Brother    Anxiety disorder Maternal Aunt    Depression Maternal Aunt    Kidney disease Maternal Uncle    Lung cancer Paternal Uncle 5   Diabetes Maternal Grandfather    Diabetes Paternal Grandmother    Cancer Paternal Grandfather        "stomach"   Cirrhosis Cousin        alcoholic   Cancer Cousin        maternal; ovarian cancer or other "female" cancer?   Throat cancer Cousin        paternal; dx 11s   Lung cancer Cousin        paternal; dx 35s   Breast cancer Cousin        paternal   Breast cancer Cousin        paternal   Colon cancer Neg Hx    Esophageal cancer Neg Hx    Stomach cancer Neg Hx    Rectal cancer Neg Hx     Past Medical History:  Diagnosis Date   Allergy    Arthritis    Bipolar 1 disorder (HCC)    Cancer (HCC)    vulva, and breast   Chronic kidney disease  Clotting disorder (HCC)    Complication of anesthesia    wakes up during procedures   Depression    GAD (generalized anxiety disorder)    Genital HSV    currently per pt  no break out 03-22-2016    GERD (gastroesophageal reflux disease)    Graves disease    Hiatal hernia    History of cervical dysplasia    2012 laser ablation   History of esophageal dilatation    for dysphasia -- x2 dilated   History of gastric ulcer    History of Helicobacter pylori infection    remote hx   History of hidradenitis suppurativa    "gets all over body intermittantly"     History of hypertension    no issue since stopped drinking alcohol 2014   History of kidney stones    History of panic attacks    History of radiation therapy 03/16/2020-05/08/2020   vulva  Dr Retta Caster   History of radiation therapy 09/09/2020-10/23/2020   left chest wall/left SCV   Dr Retta Caster   Hyperlipidemia    Hypertension    Iron deficiency anemia    Left ureteral stone    OCD (obsessive compulsive disorder)     PONV (postoperative nausea and vomiting)    Pre-diabetes    PTSD (post-traumatic stress disorder)    Recovering alcoholic in remission (HCC)    since 2014   RLS (restless legs syndrome)    Seizures (HCC)    Smokers' cough (HCC)    Thyroid  disease    Urgency of urination    Yeast infection involving the vagina and surrounding area    secondary to taking antibiotic    Patient Active Problem List   Diagnosis Date Noted   Chronic migraine without aura, with intractable migraine, so stated, with status migrainosus 08/22/2023   Mood disorder (HCC) 05/29/2023   Fatigue 05/15/2023   Prolonged QT interval 04/10/2023   Dizziness 04/10/2023   Provoked seizures (HCC) 03/31/2023   Respiratory depression 03/31/2023   Status epilepticus (HCC) 03/31/2023   Migraine with aura and without status migrainosus, not intractable 10/03/2022   Pain in right knee 09/07/2022   Bilateral primary osteoarthritis of knee 09/07/2022   Chronic migraine without aura without status migrainosus, not intractable 07/07/2022   Graves disease 05/10/2022   Sebaceous cyst 12/06/2021   Pain in left knee 11/03/2021   Hyperthyroidism 08/13/2021   Bipolar 1 disorder (HCC) 05/07/2021   Obesity (BMI 30.0-34.9) 05/07/2021   Hyperlipidemia 08/12/2020   Prediabetes 08/12/2020   Hypomagnesemia 08/12/2020   Mutation in BRIP1 gene 06/24/2020   Hypokalemia 06/17/2020   Dehydration    Colitis 06/16/2020   Port-A-Cath in place 03/11/2020   Malignant neoplasm of upper-inner quadrant of left breast in female, estrogen receptor positive (HCC) 02/25/2020   Vulvar cancer (HCC) 01/29/2020   Vulvar ulceration 01/23/2020   Essential hypertension 10/31/2019   Nausea and vomiting 12/27/2018   Seasonal allergic rhinitis 12/27/2018   History of ELISA positive for HSV 10/01/2018   Restless leg syndrome 04/01/2016   Nephrolithiasis 03/08/2016   Obstructive pyelonephritis 03/08/2016   Normocytic anemia 03/08/2016   Hidradenitis  suppurativa of left axilla 11/03/2015   Loss of weight 09/08/2015   Neck pain on left side 10/22/2014   Genital warts 05/20/2014   GERD (gastroesophageal reflux disease) 12/26/2012   Functional dyspepsia 12/26/2012   Tobacco abuse 08/22/2012   Herpes simplex virus (HSV) infection 02/03/2007    Past Surgical History:  Procedure Laterality Date  CESAREAN SECTION  1995   w/  Bilateral Tubal Ligation   COLONOSCOPY  last one 08-09-2013   CYSTOSCOPY W/ URETERAL STENT PLACEMENT Left 03/29/2016   Procedure: CYSTOSCOPY WITH STENT REPLACEMENT;  Surgeon: Bart Born, MD;  Location: [facility];  Service: Urology;  Laterality: Left;   CYSTOSCOPY WITH RETROGRADE PYELOGRAM, URETEROSCOPY AND STENT PLACEMENT Left 03/08/2016   Procedure: CYSTOSCOPY WITH  LEFT RETROGRADE PYELOGRAM, AND STENT PLACEMENT;  Surgeon: Bart Born, MD;  Location: [facility];  Service: Urology;  Laterality: Left;   CYSTOSCOPY/RETROGRADE/URETEROSCOPY/STONE EXTRACTION WITH BASKET Left 03/29/2016   Procedure: CYSTOSCOPY/RETROGRADE/URETEROSCOPY/STONE EXTRACTION WITH BASKET;  Surgeon: Bart Born, MD;  Location: [facility];  Service: Urology;  Laterality: Left;   ENDOMETRIAL ABLATION W/ NOVASURE  04-01-2010   ESOPHAGOGASTRODUODENOSCOPY  last one 08-09-2013   KNEE ARTHROSCOPY Left as teen   LASER ABLATION OF THE CERVIX  2012 approx   MASTECTOMY WITH AXILLARY LYMPH NODE DISSECTION Left 07/27/2020   Procedure: LEFT MASTECTOMY WITH LEFT RADIOACTIVE SEED GUIDED TARGETED AXILLARY LYMPH NODE DISSECTION;  Surgeon: Juanita Norlander, MD;  Location: [facility];  Service: General;  Laterality: Left;   PORT-A-CATH REMOVAL Right 05/18/2021   Procedure: REMOVAL PORT-A-CATH;  Surgeon: Dareen Ebbing, MD;  Location: [facility];  Service: General;  Laterality: Right;   PORTACATH PLACEMENT Right 03/06/2020   Procedure: INSERTION PORT-A-CATH WITH ULTRASOUND GUIDANCE;  Surgeon: Juanita Norlander, MD;  Location: [facility];  Service: General;  Laterality: Right;   ROBOTIC ASSISTED TOTAL HYSTERECTOMY WITH BILATERAL SALPINGO OOPHERECTOMY N/A 05/18/2021   Procedure: XI ROBOTIC ASSISTED TOTAL HYSTERECTOMY WITH BILATERAL SALPINGO OOPHORECTOMY;  Surgeon: Alphonso Aschoff, MD;  Location: [facility];  Service: Gynecology;  Laterality: N/A;   TRANSTHORACIC ECHOCARDIOGRAM  05-19-2006   lvsf normal, ef 55-65%, there was mild flattening of the interventricular septum during diastoli/  RV size at upper limits normal   TUBAL LIGATION     VULVA /PERINEUM BIOPSY N/A 05/18/2021   Procedure: VULVAR BIOPSY;  Surgeon: Alphonso Aschoff, MD;  Location: [facility];  Service: Gynecology;  Laterality: N/A;   WISDOM TOOTH EXTRACTION  age 89 's    Current Outpatient Medications  Medication Sig Dispense Refill   acetaminophen  (TYLENOL ) 500 MG tablet Take 500 mg by mouth every 6 (six) hours as needed.     albuterol (VENTOLIN HFA) 108 (90 Base) MCG/ACT inhaler Inhale 2 puffs into the lungs every 6 (six) hours as needed for wheezing or shortness of breath.     famotidine  (PEPCID ) 40 MG tablet Take 1 tablet (40 mg total) by mouth daily. 90 tablet 3   fluconazole  (DIFLUCAN ) 150 MG tablet Take 1 tablet (150 mg total) by mouth daily. 1 tablet 1   Fremanezumab -vfrm (AJOVY ) 225 MG/1.5ML SOAJ Inject 225 mg into the skin every 30 (thirty) days. 1.5 mL 11   gabapentin  (NEURONTIN ) 300 MG capsule Take 1 capsule (300 mg) by mouth daily. 30 capsule 1   LamoTRIgine  (LAMICTAL  XR) 200 MG TB24 24 hour tablet Take 1 tablet (200 mg total) by mouth at bedtime. 90 tablet 4   letrozole  (FEMARA ) 2.5 MG tablet Take 1 tablet by mouth daily. 90 tablet 3   lisinopril  (ZESTRIL ) 10 MG tablet Take 1 tablet (10 mg total) by mouth daily. Stop the Lisinopril /HCTZ combination 90 tablet 1   loratadine (CLARITIN REDITABS) 10 MG dissolvable tablet Take 10 mg by mouth daily. OTC     LORazepam  (ATIVAN ) 1 MG tablet Take 1 tablet (1 mg total) by mouth 3 (three)  times daily as needed for  anxiety 90 tablet 2   Magnesium  400 MG TABS Take 800 mg by mouth 2 (two) times daily. 120 tablet 1   methimazole  (TAPAZOLE ) 5 MG tablet Take 0.5 tablets (2.5 mg total) by mouth daily. 45 tablet 1   Multiple Vitamin (MULTIVITAMIN WITH MINERALS) TABS tablet Take 1 tablet by mouth daily.     omeprazole  (PRILOSEC) 40 MG capsule Take 1 capsule (40 mg total) by mouth daily before breakfast. 90 capsule 3   potassium chloride  SA (KLOR-CON  M) 20 MEQ tablet Take 1 tablet (20 mEq total) by mouth daily. 30 tablet 2   promethazine  (PHENERGAN ) 12.5 MG tablet Take 1 tablet (12.5 mg total) by mouth every 8 (eight) hours as needed for nausea or vomiting. 30 tablet 1   propranolol  (INDERAL ) 10 MG tablet Take 1 tablet by mouth 2 times daily. 180 tablet 1   sertraline  (ZOLOFT ) 50 MG tablet Take 1.5 tablets (75 mg total) by mouth daily. 45 tablet 2   spironolactone  (ALDACTONE ) 25 MG tablet Take 0.5 tablets (12.5 mg total) by mouth daily. 60 tablet 1   topiramate  (TOPAMAX ) 25 MG tablet Take 1 tablet (25 mg total) by mouth daily. 30 tablet 2   Ubrogepant  (UBRELVY ) 50 MG TABS Take 50 mg as needed for migraine, a second dose may be taken at least 2 hours after the initial dose. DO NOT EXCEED 2 doses in 24 hours. 10 tablet 1   No current facility-administered medications for this visit.    Allergies as of 01/11/2024 - Review Complete 01/04/2024  Allergen Reaction Noted   Dilaudid  [hydromorphone  hcl] Other (See Comments) 06/18/2020   Fentanyl   08/29/2023   Aspirin Hives    Depakote [divalproex sodium] Nausea And Vomiting 11/01/2016   Minocycline Hives 09/23/2011    Vitals: There were no vitals taken for this visit. Last Weight:  Wt Readings from Last 1 Encounters:  01/04/24 220 lb (99.8 kg)   Last Height:   Ht Readings from Last 1 Encounters:  01/04/24 5\' 6"  (1.676 m)    Physical exam: Exam: Gen: NAD, conversant      CV: No palpitations or chest pain or SOB. VS: Breathing at a normal rate. Weight  appears obese. Not febrile. Eyes: Conjunctivae clear without exudates or hemorrhage  Neuro: Detailed Neurologic Exam  Speech:    Speech is normal; fluent and spontaneous with normal comprehension.  Cognition:    The patient is oriented to person, place, and time;     recent and remote memory intact;     language fluent;     normal attention, concentration, fund of knowledge Cranial Nerves:    The pupils are equal, round, and reactive to light. Visual fields are full Extraocular movements are intact.  The face is symmetric with normal sensation. The palate elevates in the midline. Hearing intact. Voice is normal. Shoulder shrug is normal. The tongue has normal motion without fasciculations.   Coordination: normal  Gait:    No abnormalities noted or reported  Motor Observation:   no involuntary movements noted. Tone:    Appears normal  Posture:    Posture is normal. normal erect    Strength:    Strength is anti-gravity and symmetric in the upper and lower limbs.      Sensation: intact to LT, no reports of numbness or tingling or paresthesias       Assessment/Plan:  Patient with likely provoked seizure due to electrolyte imbalances, dehydration, vomiting, nausea, diarrhea. Started the Keppra   and switched to Lamictal  for use in mood disprder and seizure although seizure was provoked and likely due to vomiting and electrolyte imbalances and illness.    200mg  Lamictal  at bedtime for mood and is also a seizure medication worked with Dr. Carlos Chesterfield her psychiatrist to prescribe and titrate Cannot ensure no more seizures but on multiple seizure medications (Lamictal , gabapentin  via pcp, topamax  via dr Carlos Chesterfield) and seizure was likely provoked.  Doing excellent on Ajovy  after the third headache the symptoms improved, no more headaches. Dr. Carlos Chesterfield placed her on topiramate , patient says he told her it woud help her sleep and she doesn't see any benefits there. Unclear if helping headaches at  such a low dose but no harm in continuing, per Dr. Arfeen given that "Topiramate  may enhance the arrhythmogenic effect of lamotrigine , but it may also increase the elimination of lamotrigine . Also, additive CNS depressant effects may occur. "  01/11/2024: No more events or seizures. She felt weird the other day maybe was anxiety. Migraines have been better. She has been on Ajovy  for 3 months and the 3rd shot she felt improved and she doesn't have headaches. She takes topiramate  at night unsure if helping her sleep she was told it would help her sleep. She is off of the Keppra . We will continue the Ajovy  and continue all meds and office call for follow up   Meds ordered this encounter  Medications   LamoTRIgine  (LAMICTAL  XR) 200 MG TB24 24 hour tablet    Sig: Take 1 tablet (200 mg total) by mouth at bedtime.    Dispense:  90 tablet    Refill:  4   Fremanezumab -vfrm (AJOVY ) 225 MG/1.5ML SOAJ    Sig: Inject 225 mg into the skin every 30 (thirty) days.    Dispense:  1.5 mL    Refill:  11    No orders of the defined types were placed in this encounter.  Meds ordered this encounter  Medications   Fremanezumab -vfrm (AJOVY ) 225 MG/1.5ML SOAJ    Sig: Inject 225 mg into the skin every 30 (thirty) days.    Dispense:  1.5 mL    Refill:  11   LamoTRIgine  (LAMICTAL  XR) 200 MG TB24 24 hour tablet    Sig: Take 1 tablet (200 mg total) by mouth at bedtime.    Dispense:  90 tablet    Refill:  4   Discussed: Per [place]  DMV statutes, patients with seizures are not allowed to drive until they have been seizure-free for six months.    Use caution when using heavy equipment or power tools. Avoid working on ladders or at heights. Take showers instead of baths. Ensure the water  temperature is not too high on the home water  heater. Do not go swimming alone. Do not lock yourself in a room alone (i.e. bathroom). When caring for infants or small children, sit down when holding, feeding, or changing them to  minimize risk of injury to the child in the event you have a seizure. Maintain good sleep hygiene. Avoid alcohol.    If patient has another seizure, call 911 and bring them back to the ED if: A.  The seizure lasts longer than 5 minutes.      B.  The patient doesn't wake shortly after the seizure or has new problems such as difficulty seeing, speaking or moving following the seizure C.  The patient was injured during the seizure D.  The patient has a temperature over 102 F (39C) E.  The  patient vomited during the seizure and now is having trouble breathing  Per [place]  DMV statutes, patients with seizures are not allowed to drive until they have been seizure-free for six months.  Other recommendations include using caution when using heavy equipment or power tools. Avoid working on ladders or at heights. Take showers instead of baths.  Do not swim alone.  Ensure the water  temperature is not too high on the home water  heater. Do not go swimming alone. Do not lock yourself in a room alone (i.e. bathroom). When caring for infants or small children, sit down when holding, feeding, or changing them to minimize risk of injury to the child in the event you have a seizure. Maintain good sleep hygiene. Avoid alcohol.  Also recommend adequate sleep, hydration, good diet and minimize stress.  During the Seizure  - First, ensure adequate ventilation and place patients on the floor on their left side  Loosen clothing around the neck and ensure the airway is patent. If the patient is clenching the teeth, do not force the mouth open with any object as this can cause severe damage - Remove all items from the surrounding that can be hazardous. The patient may be oblivious to what's happening and may not even know what he or she is doing. If the patient is confused and wandering, either gently guide him/her away and block access to outside areas - Reassure the individual and be comforting - Call 911. In most  cases, the seizure ends before EMS arrives. However, there are cases when seizures may last over 3 to 5 minutes. Or the individual may have developed breathing difficulties or severe injuries. If a pregnant patient or a person with diabetes develops a seizure, it is prudent to call an ambulance. - Finally, if the patient does not regain full consciousness, then call EMS. Most patients will remain confused for about 45 to 90 minutes after a seizure, so you must use judgment in calling for help. - Avoid restraints but make sure the patient is in a bed with padded side rails - Place the individual in a lateral position with the neck slightly flexed; this will help the saliva drain from the mouth and prevent the tongue from falling backward - Remove all nearby furniture and other hazards from the area - Provide verbal assurance as the individual is regaining consciousness - Provide the patient with privacy if possible - Call for help and start treatment as ordered by the caregiver   fter the Seizure (Postictal Stage)  After a seizure, most patients experience confusion, fatigue, muscle pain and/or a headache. Thus, one should permit the individual to sleep. For the next few days, reassurance is essential. Being calm and helping reorient the person is also of importance.  Most seizures are painless and end spontaneously. Seizures are not harmful to others but can lead to complications such as stress on the lungs, brain and the heart. Individuals with prior lung problems may develop labored breathing and respiratory distress.   Discussed: To prevent or relieve headaches, try the following: Cool Compress. Lie down and place a cool compress on your head.  Avoid headache triggers. If certain foods or odors seem to have triggered your migraines in the past, avoid them. A headache diary might help you identify triggers.  Include physical activity in your daily routine. Try a daily walk or other moderate  aerobic exercise.  Manage stress. Find healthy ways to cope with the stressors, such as delegating tasks on  your to-do list.  Practice relaxation techniques. Try deep breathing, yoga, massage and visualization.  Eat regularly. Eating regularly scheduled meals and maintaining a healthy diet might help prevent headaches. Also, drink plenty of fluids.  Follow a regular sleep schedule. Sleep deprivation might contribute to headaches Consider biofeedback. With this mind-body technique, you learn to control certain bodily functions -- such as muscle tension, heart rate and blood pressure -- to prevent headaches or reduce headache pain.    Proceed to emergency room if you experience new or worsening symptoms or symptoms do not resolve, if you have new neurologic symptoms or if headache is severe, or for any concerning symptom.   Provided education and documentation from [employer] toolbox including articles on: chronic migraine medication overuse headache, chronic migraines, prevention of migraines, behavioral and other nonpharmacologic treatments for headache.  Cc: Lawrance Presume, MD,  Lawrance Presume, MD  Aldona Amel, MD  [facility] [address] [place], [place] [address]

## 2024-01-11 NOTE — Telephone Encounter (Signed)
 9 month follow up with NP for med check and refill please. Thank you.

## 2024-01-11 NOTE — Telephone Encounter (Signed)
 Per provider moved appt from 10/10 to 10/17. Patient aware of new date/time

## 2024-01-11 NOTE — Telephone Encounter (Signed)
 Spoke with patient and scheduled follow up with Sandra Miller for 10/14/24 at 2pm

## 2024-01-12 ENCOUNTER — Encounter (HOSPITAL_COMMUNITY): Payer: Self-pay

## 2024-01-12 ENCOUNTER — Encounter: Payer: Self-pay | Admitting: Internal Medicine

## 2024-01-15 ENCOUNTER — Encounter: Payer: Self-pay | Admitting: Hematology and Oncology

## 2024-01-15 ENCOUNTER — Encounter: Payer: Self-pay | Admitting: Internal Medicine

## 2024-01-16 ENCOUNTER — Other Ambulatory Visit (HOSPITAL_COMMUNITY): Payer: Self-pay

## 2024-01-16 ENCOUNTER — Encounter: Payer: Self-pay | Admitting: Hematology and Oncology

## 2024-01-16 ENCOUNTER — Other Ambulatory Visit: Payer: Self-pay

## 2024-01-16 ENCOUNTER — Encounter (HOSPITAL_COMMUNITY): Payer: Self-pay

## 2024-01-17 ENCOUNTER — Other Ambulatory Visit (HOSPITAL_COMMUNITY): Payer: Self-pay

## 2024-01-17 ENCOUNTER — Other Ambulatory Visit: Payer: Self-pay

## 2024-01-18 ENCOUNTER — Telehealth: Payer: Self-pay | Admitting: Internal Medicine

## 2024-01-18 ENCOUNTER — Ambulatory Visit: Admitting: Critical Care Medicine

## 2024-01-18 NOTE — Telephone Encounter (Signed)
 RN contacted patient regarding her questions about appropriate clear liquids. RN asked patient if she has the clear liquid chart; patient stated she does but she just wanted to be sure about ice chips, pickle juice, and orange gelatin.   RN told patient that she may consume these things; however, since pickle juice is high in sodium, RN told patient she will need to drink plenty of water  and a full glass of clear liquids every hour to stay hydrated and to help her prep medication work well. Patient stated understanding.

## 2024-01-18 NOTE — Telephone Encounter (Signed)
 Patient called and stated that she was wondering if she could have ice chips or pickle juice. Patient is scheduled for a procedure for tomorrow. Please advise.

## 2024-01-18 NOTE — Progress Notes (Unsigned)
 [facility] History and Physical   Primary Care Physician:  Lawrance Presume, MD   Reason for Procedure:  Nausea vomiting, abnormal CT scan of the sigmoid colon, GERD  Plan:    EGD and colonoscopy     HPI: Sandra Miller is a 48 y.o. female presenting for evaluation of nausea and vomiting, abnormal CT scan of the sigmoid colon and GERD.  She was seen last fall but there has been difficulty scheduling things.  She had had new onset seizures and had diarrhea after that as well.  She has not had seizures in some time.  Now no vomiting but nausea daily and upper abdominal burning. Says other MD's thought omeprazole  may have lowered magnesium  and ppt szs though she had nausea, vomiting and diarrhea then.     Past Medical History:  Diagnosis Date   Allergy    Arthritis    Bipolar 1 disorder (HCC)    Cancer (HCC)    vulva, and breast   Chronic kidney disease    Clotting disorder (HCC)    Complication of anesthesia    wakes up during procedures   Depression    GAD (generalized anxiety disorder)    Genital HSV    currently per pt  no break out 03-22-2016    GERD (gastroesophageal reflux disease)    Graves disease    Hiatal hernia    History of cervical dysplasia    2012 laser ablation   History of esophageal dilatation    for dysphasia -- x2 dilated   History of gastric ulcer    History of Helicobacter pylori infection    remote hx   History of hidradenitis suppurativa    "gets all over body intermittantly"     History of hypertension    no issue since stopped drinking alcohol 2014   History of kidney stones    History of panic attacks    History of radiation therapy 03/16/2020-05/08/2020   vulva  Dr Retta Caster   History of radiation therapy 09/09/2020-10/23/2020   left chest wall/left SCV   Dr Retta Caster   Hyperlipidemia    Hypertension    Iron deficiency anemia    Left ureteral stone    OCD (obsessive compulsive disorder)    PONV (postoperative  nausea and vomiting)    Pre-diabetes    PTSD (post-traumatic stress disorder)    Recovering alcoholic in remission (HCC)    since 2014   RLS (restless legs syndrome)    Seizures (HCC)    Smokers' cough (HCC)    Thyroid  disease    Urgency of urination    Yeast infection involving the vagina and surrounding area    secondary to taking antibiotic    Past Surgical History:  Procedure Laterality Date   CESAREAN SECTION  1995   w/  Bilateral Tubal Ligation   COLONOSCOPY  last one 08-09-2013   CYSTOSCOPY W/ URETERAL STENT PLACEMENT Left 03/29/2016   Procedure: CYSTOSCOPY WITH STENT REPLACEMENT;  Surgeon: Bart Born, MD;  Location: [facility];  Service: Urology;  Laterality: Left;   CYSTOSCOPY WITH RETROGRADE PYELOGRAM, URETEROSCOPY AND STENT PLACEMENT Left 03/08/2016   Procedure: CYSTOSCOPY WITH  LEFT RETROGRADE PYELOGRAM, AND STENT PLACEMENT;  Surgeon: Bart Born, MD;  Location: [facility];  Service: Urology;  Laterality: Left;   CYSTOSCOPY/RETROGRADE/URETEROSCOPY/STONE EXTRACTION WITH BASKET Left 03/29/2016   Procedure: CYSTOSCOPY/RETROGRADE/URETEROSCOPY/STONE EXTRACTION WITH BASKET;  Surgeon: Bart Born, MD;  Location: [facility];  Service: Urology;  Laterality: Left;   ENDOMETRIAL ABLATION W/ NOVASURE  04-01-2010   ESOPHAGOGASTRODUODENOSCOPY  last one 08-09-2013   KNEE ARTHROSCOPY Left as teen   LASER ABLATION OF THE CERVIX  2012 approx   MASTECTOMY WITH AXILLARY LYMPH NODE DISSECTION Left 07/27/2020   Procedure: LEFT MASTECTOMY WITH LEFT RADIOACTIVE SEED GUIDED TARGETED AXILLARY LYMPH NODE DISSECTION;  Surgeon: Juanita Norlander, MD;  Location: [facility];  Service: General;  Laterality: Left;   PORT-A-CATH REMOVAL Right 05/18/2021   Procedure: REMOVAL PORT-A-CATH;  Surgeon: Dareen Ebbing, MD;  Location: [facility];  Service: General;  Laterality: Right;   PORTACATH PLACEMENT Right 03/06/2020   Procedure: INSERTION PORT-A-CATH WITH ULTRASOUND GUIDANCE;   Surgeon: Juanita Norlander, MD;  Location: [facility];  Service: General;  Laterality: Right;   ROBOTIC ASSISTED TOTAL HYSTERECTOMY WITH BILATERAL SALPINGO OOPHERECTOMY N/A 05/18/2021   Procedure: XI ROBOTIC ASSISTED TOTAL HYSTERECTOMY WITH BILATERAL SALPINGO OOPHORECTOMY;  Surgeon: Alphonso Aschoff, MD;  Location: [facility];  Service: Gynecology;  Laterality: N/A;   TRANSTHORACIC ECHOCARDIOGRAM  05-19-2006   lvsf normal, ef 55-65%, there was mild flattening of the interventricular septum during diastoli/  RV size at upper limits normal   TUBAL LIGATION     VULVA /PERINEUM BIOPSY N/A 05/18/2021   Procedure: VULVAR BIOPSY;  Surgeon: Alphonso Aschoff, MD;  Location: [facility];  Service: Gynecology;  Laterality: N/A;   WISDOM TOOTH EXTRACTION  age 47 's    Prior to Admission medications   Medication Sig Start Date End Date Taking? Authorizing Provider  acetaminophen  (TYLENOL ) 500 MG tablet Take 500 mg by mouth every 6 (six) hours as needed.    [provider]  albuterol (VENTOLIN HFA) 108 (90 Base) MCG/ACT inhaler Inhale 2 puffs into the lungs every 6 (six) hours as needed for wheezing or shortness of breath.    [provider]  famotidine  (PEPCID ) 40 MG tablet Take 1 tablet (40 mg total) by mouth daily. 06/02/23   Edmonia Gottron, PA-C  fluconazole  (DIFLUCAN ) 150 MG tablet Take 1 tablet (150 mg total) by mouth daily. 12/14/23   Suzi Essex, MD  Fremanezumab -vfrm (AJOVY ) 225 MG/1.5ML SOAJ Inject 225 mg into the skin every 30 (thirty) days. 01/11/24   Glory Larsen, MD  gabapentin  (NEURONTIN ) 300 MG capsule Take 1 capsule (300 mg) by mouth daily. 12/05/23   Edmonia Gottron, PA-C  LamoTRIgine  (LAMICTAL  XR) 200 MG TB24 24 hour tablet Take 1 tablet (200 mg total) by mouth at bedtime. 01/11/24   Glory Larsen, MD  letrozole  (FEMARA ) 2.5 MG tablet Take 1 tablet by mouth daily. 11/09/23   Gudena, Vinay, MD  lisinopril  (ZESTRIL ) 10 MG tablet Take 1 tablet (10 mg total) by mouth daily.  Stop the Lisinopril /HCTZ combination 10/02/23   Lawrance Presume, MD  loratadine (CLARITIN REDITABS) 10 MG dissolvable tablet Take 10 mg by mouth daily. OTC    [provider]  LORazepam  (ATIVAN ) 1 MG tablet Take 1 tablet (1 mg total) by mouth 3 (three) times daily as needed for anxiety 12/22/23   Arfeen, Bronson Canny, MD  Magnesium  400 MG TABS Take 800 mg by mouth 2 (two) times daily. 04/22/23   Lawrance Presume, MD  methimazole  (TAPAZOLE ) 5 MG tablet Take 0.5 tablets (2.5 mg total) by mouth daily. 11/09/23     Multiple Vitamin (MULTIVITAMIN WITH MINERALS) TABS tablet Take 1 tablet by mouth daily.    [provider]  omeprazole  (PRILOSEC) 40 MG capsule Take 1 capsule (40 mg total) by mouth  daily before breakfast. 06/02/23   Edmonia Gottron, PA-C  potassium chloride  SA (KLOR-CON  M) 20 MEQ tablet Take 1 tablet (20 mEq total) by mouth daily. 12/08/23   Newlin, Enobong, MD  promethazine  (PHENERGAN ) 12.5 MG tablet Take 1 tablet (12.5 mg total) by mouth every 8 (eight) hours as needed for nausea or vomiting. 11/09/23   Kenney Peacemaker, MD  propranolol  (INDERAL ) 10 MG tablet Take 1 tablet by mouth 2 times daily. 10/02/23   Lawrance Presume, MD  sertraline  (ZOLOFT ) 50 MG tablet Take 1.5 tablets (75 mg total) by mouth daily. 12/22/23   Arfeen, Bronson Canny, MD  spironolactone  (ALDACTONE ) 25 MG tablet Take 0.5 tablets (12.5 mg total) by mouth daily. 10/02/23   Lawrance Presume, MD  topiramate  (TOPAMAX ) 25 MG tablet Take 1 tablet (25 mg total) by mouth daily. 12/22/23   Arfeen, Bronson Canny, MD  Ubrogepant  (UBRELVY ) 50 MG TABS Take 50 mg as needed for migraine, a second dose may be taken at least 2 hours after the initial dose. DO NOT EXCEED 2 doses in 24 hours. 12/21/23   Glory Larsen, MD    Current Outpatient Medications  Medication Sig Dispense Refill   acetaminophen  (TYLENOL ) 500 MG tablet Take 500 mg by mouth every 6 (six) hours as needed.     albuterol (VENTOLIN HFA) 108 (90 Base) MCG/ACT inhaler  Inhale 2 puffs into the lungs every 6 (six) hours as needed for wheezing or shortness of breath.     famotidine  (PEPCID ) 40 MG tablet Take 1 tablet (40 mg total) by mouth daily. 90 tablet 3   gabapentin  (NEURONTIN ) 300 MG capsule Take 1 capsule (300 mg) by mouth daily. 30 capsule 1   LamoTRIgine  (LAMICTAL  XR) 200 MG TB24 24 hour tablet Take 1 tablet (200 mg total) by mouth at bedtime. 90 tablet 4   letrozole  (FEMARA ) 2.5 MG tablet Take 1 tablet by mouth daily. 90 tablet 3   lisinopril  (ZESTRIL ) 10 MG tablet Take 1 tablet (10 mg total) by mouth daily. Stop the Lisinopril /HCTZ combination 90 tablet 1   loratadine (CLARITIN REDITABS) 10 MG dissolvable tablet Take 10 mg by mouth daily. OTC     LORazepam  (ATIVAN ) 1 MG tablet Take 1 tablet (1 mg total) by mouth 3 (three) times daily as needed for anxiety 90 tablet 2   Magnesium  400 MG TABS Take 800 mg by mouth 2 (two) times daily. 120 tablet 1   methimazole  (TAPAZOLE ) 5 MG tablet Take 0.5 tablets (2.5 mg total) by mouth daily. 45 tablet 1   Multiple Vitamin (MULTIVITAMIN WITH MINERALS) TABS tablet Take 1 tablet by mouth daily.     omeprazole  (PRILOSEC) 40 MG capsule Take 1 capsule (40 mg total) by mouth daily before breakfast. 90 capsule 3   potassium chloride  SA (KLOR-CON  M) 20 MEQ tablet Take 1 tablet (20 mEq total) by mouth daily. 30 tablet 2   promethazine  (PHENERGAN ) 12.5 MG tablet Take 1 tablet (12.5 mg total) by mouth every 8 (eight) hours as needed for nausea or vomiting. 30 tablet 1   propranolol  (INDERAL ) 10 MG tablet Take 1 tablet by mouth 2 times daily. 180 tablet 1   sertraline  (ZOLOFT ) 50 MG tablet Take 1.5 tablets (75 mg total) by mouth daily. 45 tablet 2   spironolactone  (ALDACTONE ) 25 MG tablet Take 0.5 tablets (12.5 mg total) by mouth daily. 60 tablet 1   topiramate  (TOPAMAX ) 25 MG tablet Take 1 tablet (25 mg total) by mouth daily. 30 tablet 2  fluconazole  (DIFLUCAN ) 150 MG tablet Take 1 tablet (150 mg total) by mouth daily. 1 tablet 1    Fremanezumab -vfrm (AJOVY ) 225 MG/1.5ML SOAJ Inject 225 mg into the skin every 30 (thirty) days. 1.5 mL 11   Ubrogepant  (UBRELVY ) 50 MG TABS Take 50 mg as needed for migraine, a second dose may be taken at least 2 hours after the initial dose. DO NOT EXCEED 2 doses in 24 hours. 10 tablet 1   Current Facility-Administered Medications  Medication Dose Route Frequency Provider Last Rate Last Admin   0.9 %  sodium chloride  infusion  500 mL Intravenous Once Kenney Peacemaker, MD        Allergies as of 01/19/2024 - Review Complete 01/19/2024  Allergen Reaction Noted   Dilaudid  [hydromorphone  hcl] Other (See Comments) 06/18/2020   Fentanyl   08/29/2023   Aspirin Hives    Depakote [divalproex sodium] Nausea And Vomiting 11/01/2016   Minocycline Hives 09/23/2011    Family History  Problem Relation Age of Onset   Heart disease Mother    Depression Mother    Anxiety disorder Mother    Heart disease Father    Lung cancer Father        d. 65   Alcohol abuse Father    Drug abuse Brother    Alcohol abuse Brother    Drug abuse Brother    ADD / ADHD Brother    Colon polyps Brother    Anxiety disorder Maternal Aunt    Depression Maternal Aunt    Kidney disease Maternal Uncle    Lung cancer Paternal Uncle 22   Diabetes Maternal Grandfather    Diabetes Paternal Grandmother    Cancer Paternal Grandfather        "stomach"   Cirrhosis Cousin        alcoholic   Cancer Cousin        maternal; ovarian cancer or other "female" cancer?   Throat cancer Cousin        paternal; dx 31s   Lung cancer Cousin        paternal; dx 64s   Breast cancer Cousin        paternal   Breast cancer Cousin        paternal   Colon cancer Neg Hx    Esophageal cancer Neg Hx    Stomach cancer Neg Hx    Rectal cancer Neg Hx     Social History   Socioeconomic History   Marital status: Single    Spouse name: Not on file   Number of children: Not on file   Years of education: Not on file   Highest education  level: Associate degree: academic program  Occupational History   Not on file  Tobacco Use   Smoking status: Every Day    Current packs/day: 0.50    Types: Cigarettes   Smokeless tobacco: Never   Tobacco comments:    Recently started a smoking cessation class. Quit 03/31/23  Vaping Use   Vaping status: Never Used  Substance and Sexual Activity   Alcohol use: Not Currently   Drug use: Not Currently    Types: Marijuana    Comment: none in about 1 year   Sexual activity: Yes    Partners: Male    Birth control/protection: Surgical  Other Topics Concern   Not on file  Social History Narrative   Former [facility] patient.      Was on disability at one point.   Return to the workforce.  40 hours a week at [employer], 10 hours a week on the weekends at [employer] in [place], [employer] at night 10 to 12 hours a week.      Has grown children, she lives alone with a pet, continues to smoke no alcohol or drug use at this time      History of EtOH abuse and THC use.         Social Drivers of Corporate investment banker Strain: Low Risk  (10/02/2023)   Overall Financial Resource Strain (CARDIA)    Difficulty of Paying Living Expenses: Not very hard  Recent Concern: Financial Resource Strain - Medium Risk (08/01/2023)   Overall Financial Resource Strain (CARDIA)    Difficulty of Paying Living Expenses: Somewhat hard  Food Insecurity: No Food Insecurity (10/02/2023)   Hunger Vital Sign    Worried About Running Out of Food in the Last Year: Never true    Ran Out of Food in the Last Year: Never true  Recent Concern: Food Insecurity - Food Insecurity Present (09/28/2023)   Received from [facility]   Hunger Vital Sign    Worried About Running Out of Food in the Last Year: Sometimes true    Ran Out of Food in the Last Year: Patient declined  Transportation Needs: Unmet Transportation Needs (10/02/2023)   PRAPARE - Administrator, Civil Service (Medical): Yes     Lack of Transportation (Non-Medical): No  Physical Activity: Inactive (10/02/2023)   Exercise Vital Sign    Days of Exercise per Week: 0 days    Minutes of Exercise per Session: 0 min  Stress: Stress Concern Present (10/02/2023)   [employer] of Occupational Health - Occupational Stress Questionnaire    Feeling of Stress : Rather much  Social Connections: Moderately Isolated (10/02/2023)   Social Connection and Isolation Panel [NHANES]    Frequency of Communication with Friends and Family: More than three times a week    Frequency of Social Gatherings with Friends and Family: Never    Attends Religious Services: 1 to 4 times per year    Active Member of [employer] or Organizations: No    Attends Banker Meetings: Never    Marital Status: Never married  Intimate Partner Violence: Not At Risk (09/28/2023)   Received from [facility]   HITS    Over the last 12 months how often did your partner physically hurt you?: Never    Over the last 12 months how often did your partner insult you or talk down to you?: Never    Over the last 12 months how often did your partner threaten you with physical harm?: Never    Over the last 12 months how often did your partner scream or curse at you?: Never    Review of Systems:  All other review of systems negative except as mentioned in the HPI.  Physical Exam: Vital signs BP 132/89   Pulse 85   Temp 97.9 F (36.6 C)   Ht 5\' 6"  (1.676 m)   Wt 220 lb (99.8 kg)   SpO2 100%   BMI 35.51 kg/m   General:   Alert,  Well-developed, well-nourished, pleasant and cooperative in NAD Lungs:  Clear throughout to auscultation.   Heart:  Regular rate and rhythm; no murmurs, clicks, rubs,  or gallops. Abdomen:  Soft, nontender and nondistended. Normal bowel sounds.   Neuro/Psych:  Alert and cooperative. Normal mood and affect. A and O x 3   @  Kenney Peacemaker, MD, [facility] [phone] (pager) 01/19/2024 11:26 AM@

## 2024-01-19 ENCOUNTER — Encounter: Payer: Self-pay | Admitting: Internal Medicine

## 2024-01-19 ENCOUNTER — Other Ambulatory Visit: Payer: Self-pay

## 2024-01-19 ENCOUNTER — Ambulatory Visit: Admitting: Internal Medicine

## 2024-01-19 ENCOUNTER — Other Ambulatory Visit: Payer: Self-pay | Admitting: Internal Medicine

## 2024-01-19 ENCOUNTER — Encounter: Payer: Self-pay | Admitting: Hematology and Oncology

## 2024-01-19 VITALS — BP 140/86 | HR 77 | Temp 97.9°F | Resp 20 | Ht 66.0 in | Wt 220.0 lb

## 2024-01-19 DIAGNOSIS — R935 Abnormal findings on diagnostic imaging of other abdominal regions, including retroperitoneum: Secondary | ICD-10-CM

## 2024-01-19 DIAGNOSIS — R112 Nausea with vomiting, unspecified: Secondary | ICD-10-CM | POA: Diagnosis not present

## 2024-01-19 DIAGNOSIS — K6289 Other specified diseases of anus and rectum: Secondary | ICD-10-CM | POA: Diagnosis not present

## 2024-01-19 DIAGNOSIS — R933 Abnormal findings on diagnostic imaging of other parts of digestive tract: Secondary | ICD-10-CM | POA: Diagnosis not present

## 2024-01-19 MED ORDER — SODIUM CHLORIDE 0.9 % IV SOLN: 500.0000 mL | Freq: Once | INTRAVENOUS | Status: DC

## 2024-01-19 MED ORDER — PROMETHAZINE HCL 12.5 MG PO TABS
12.5000 mg | ORAL_TABLET | Freq: Three times a day (TID) | ORAL | 1 refills | Status: DC | PRN
  Filled 2024-01-19: qty 30, 10d supply, fill #0
  Filled 2024-02-26: qty 30, 10d supply, fill #1

## 2024-01-19 NOTE — Op Note (Signed)
  [facility] Patient Name: Sandra Miller Procedure Date: 01/19/2024 11:34 AM MRN: [id number] Endoscopist: Kenney Peacemaker , MD, [id number] Age: 48 Referring MD:  Date of Birth: 06-14-1976 Gender: Female Account #: [id number] Procedure:                Colonoscopy Indications:              Abnormal CT of the GI tract Medicines:                Monitored Anesthesia Care Procedure:                Pre-Anesthesia Assessment:                           - Prior to the procedure, a History and Physical                            was performed, and patient medications and                            allergies were reviewed. The patient's tolerance of                            previous anesthesia was also reviewed. The risks                            and benefits of the procedure and the sedation                            options and risks were discussed with the patient.                            All questions were answered, and informed consent                            was obtained. Prior Anticoagulants: The patient has                            taken no anticoagulant or antiplatelet agents. ASA                            Grade Assessment: III - A patient with severe                            systemic disease. After reviewing the risks and                            benefits, the patient was deemed in satisfactory                            condition to undergo the procedure.                           After obtaining informed consent, the colonoscope  was passed under direct vision. Throughout the                            procedure, the patient's blood pressure, pulse, and                            oxygen saturations were monitored continuously. The                            Olympus Scope SN: [id number] was introduced through                            the anus and advanced to the the terminal ileum,                            with identification of the  appendiceal orifice and                            IC valve. The colonoscopy was performed without                            difficulty. The patient tolerated the procedure                            well. The quality of the bowel preparation was                            adequate. The terminal ileum, ileocecal valve,                            appendiceal orifice, and rectum were photographed.                            The bowel preparation used was Miralax  via split                            dose instruction. Scope In: 11:46:45 AM Scope Out: 11:57:52 AM Scope Withdrawal Time: 0 hours 8 minutes 41 seconds  Total Procedure Duration: 0 hours 11 minutes 7 seconds  Findings:                 The perianal and digital rectal examinations were                            normal.                           The terminal ileum appeared normal.                           The entire examined colon appeared normal on direct                            and retroflexion views except for hypertrophied  anal papillae. Complications:            No immediate complications. Estimated Blood Loss:     Estimated blood loss: none. Impression:               - The examined portion of the ileum was normal.                           - The entire examined colon is normal on direct and                            retroflexion views other than hypertophied anal                            papillae.                           - No specimens collected. Recommendation:           - Patient has a contact number available for                            emergencies. The signs and symptoms of potential                            delayed complications were discussed with the                            patient. Return to normal activities tomorrow.                            Written discharge instructions were provided to the                            patient.                           - Resume  previous diet.                           - Continue present medications.                           - Repeat colonoscopy in 10 years for screening                            purposes. Kenney Peacemaker, MD 01/19/2024 12:20:57 PM This report has been signed electronically.

## 2024-01-19 NOTE — Progress Notes (Signed)
 Report given to PACU, vss

## 2024-01-19 NOTE — Progress Notes (Signed)
 1136 [address]  0.1 mg IV given due large amount of secretions upon assessment.  MD made aware, vss

## 2024-01-19 NOTE — Op Note (Signed)
 [facility] Patient Name: Sandra Miller Procedure Date: 01/19/2024 11:35 AM MRN: [id number] Endoscopist: Kenney Peacemaker , MD, [id number] Age: 48 Referring MD:  Date of Birth: 1976-01-27 Gender: Female Account #: [id number] Procedure:                Upper GI endoscopy Indications:              Nausea with vomiting Medicines:                Monitored Anesthesia Care Procedure:                Pre-Anesthesia Assessment:                           - Prior to the procedure, a History and Physical                            was performed, and patient medications and                            allergies were reviewed. The patient's tolerance of                            previous anesthesia was also reviewed. The risks                            and benefits of the procedure and the sedation                            options and risks were discussed with the patient.                            All questions were answered, and informed consent                            was obtained. Prior Anticoagulants: The patient has                            taken no anticoagulant or antiplatelet agents. ASA                            Grade Assessment: III - A patient with severe                            systemic disease. After reviewing the risks and                            benefits, the patient was deemed in satisfactory                            condition to undergo the procedure.                           After obtaining informed consent, the endoscope was  passed under direct vision. Throughout the                            procedure, the patient's blood pressure, pulse, and                            oxygen saturations were monitored continuously. The                            GIF [id number] #[id number] was introduced through the                            mouth, and advanced to the second part of duodenum.                            The upper GI endoscopy was  accomplished without                            difficulty. The patient tolerated the procedure                            well. Scope In: Scope Out: Findings:                 The esophagus was normal.                           The stomach was normal.                           The examined duodenum was normal.                           The cardia and gastric fundus were normal on                            retroflexion. Complications:            No immediate complications. Estimated Blood Loss:     Estimated blood loss: none. Impression:               - Normal esophagus.                           - Normal stomach.                           - Normal examined duodenum.                           - No specimens collected. Original indication as                            above - not vomiting lately, daily nausea and also                            upper abdominal burning. These symptoms are  consistent w/ known hx of functional dyspepsia                            which has been refractory to treatments. Recently                            was told seizures were from low Mg and omeprazole                             use, though occurred in the setting of nausea,                            vomiting and diarrhea but PPI ca cause low Mg and                            has had previosuly. Recommendation:           - Patient has a contact number available for                            emergencies. The signs and symptoms of potential                            delayed complications were discussed with the                            patient. Return to normal activities tomorrow.                            Written discharge instructions were provided to the                            patient.                           - Resume previous diet.                           - Continue present medications. Given multiple                            failed treatments, Mg issues and  current                            complicated medication regimen for seizures,                            migraines and mental health issues I do not have                            any new treatment ideas. Kenney Peacemaker, MD 01/19/2024 12:18:31 PM This report has been signed electronically.

## 2024-01-19 NOTE — Progress Notes (Signed)
 VS by [facility]  Pt's states no medical or surgical changes since previsit or office visit.

## 2024-01-19 NOTE — Patient Instructions (Addendum)
 The esophagus, stomach and upper intestine look normal. No cause of symptoms found. Your symptoms of nausea and stomach burning have been a problem before also. I think something is triggering the nerves to create this sensation, the diagnosis is functional dyspepsia. Multiple medications have been tried in the past and unfortunately you still have it. I do not have any new suggestions at this point.   Colonoscopy was ok - no significant abnormalities.  I wish I had a new medication to try to help but given what you have tried before and the complicated medication regimen you are on at this time, I do not think retrying or adding medication makes sense.  I do not think there is a serious health problem causing your symptoms.  I appreciate the opportunity to care for you. Kenney Peacemaker, MD, [facility]   Discharge instructions given. Normal exam. Resume previous medications. YOU HAD AN ENDOSCOPIC PROCEDURE TODAY AT THE [facility]:   Refer to the procedure report that was given to you for any specific questions about what was found during the examination.  If the procedure report does not answer your questions, please call your gastroenterologist to clarify.  If you requested that your care partner not be given the details of your procedure findings, then the procedure report has been included in a sealed envelope for you to review at your convenience later.  YOU SHOULD EXPECT: Some feelings of bloating in the abdomen. Passage of more gas than usual.  Walking can help get rid of the air that was put into your GI tract during the procedure and reduce the bloating. If you had a lower endoscopy (such as a colonoscopy or flexible sigmoidoscopy) you may notice spotting of blood in your stool or on the toilet paper. If you underwent a bowel prep for your procedure, you may not have a normal bowel movement for a few days.  Please Note:  You might notice some irritation and congestion in your nose  or some drainage.  This is from the oxygen used during your procedure.  There is no need for concern and it should clear up in a day or so.  SYMPTOMS TO REPORT IMMEDIATELY:  Following lower endoscopy (colonoscopy or flexible sigmoidoscopy):  Excessive amounts of blood in the stool  Significant tenderness or worsening of abdominal pains  Swelling of the abdomen that is new, acute  Fever of 100F or higher  Following upper endoscopy (EGD)  Vomiting of blood or coffee ground material  New chest pain or pain under the shoulder blades  Painful or persistently difficult swallowing  New shortness of breath  Fever of 100F or higher  Black, tarry-looking stools  For urgent or emergent issues, a gastroenterologist can be reached at any hour by calling [phone]. Do not use MyChart messaging for urgent concerns.    DIET:  We do recommend a small meal at first, but then you may proceed to your regular diet.  Drink plenty of fluids but you should avoid alcoholic beverages for 24 hours.  ACTIVITY:  You should plan to take it easy for the rest of today and you should NOT DRIVE or use heavy machinery until tomorrow (because of the sedation medicines used during the test).    FOLLOW UP: Our staff will call the number listed on your records the next business day following your procedure.  We will call around 7:15- 8:00 am to check on you and address any questions or concerns that you may  have regarding the information given to you following your procedure. If we do not reach you, we will leave a message.     If any biopsies were taken you will be contacted by phone or by letter within the next 1-3 weeks.  Please call us  at [phone] if you have not heard about the biopsies in 3 weeks.    SIGNATURES/CONFIDENTIALITY: You and/or your care partner have signed paperwork which will be entered into your electronic medical record.  These signatures attest to the fact that that the information above  on your After Visit Summary has been reviewed and is understood.  Full responsibility of the confidentiality of this discharge information lies with you and/or your care-partner.

## 2024-01-22 ENCOUNTER — Telehealth: Payer: Self-pay | Admitting: *Deleted

## 2024-01-22 ENCOUNTER — Other Ambulatory Visit (HOSPITAL_COMMUNITY): Payer: Self-pay

## 2024-01-22 NOTE — Telephone Encounter (Signed)
  Follow up Call-     01/19/2024   11:00 AM  Call back number  Post procedure Call Back phone  # [phone]  Permission to leave phone message Yes     Patient questions:  Do you have a fever, pain , or abdominal swelling? No. Pain Score  0 *  Have you tolerated food without any problems? Yes.    Have you been able to return to your normal activities? Yes.    Do you have any questions about your discharge instructions: Diet   No. Medications  No. Follow up visit  No.  Do you have questions or concerns about your Care? No.  Actions: * If pain score is 4 or above: No action needed, pain <4.

## 2024-01-23 ENCOUNTER — Other Ambulatory Visit: Payer: Self-pay

## 2024-01-24 ENCOUNTER — Other Ambulatory Visit: Payer: Self-pay | Admitting: Physician Assistant

## 2024-01-24 DIAGNOSIS — R1013 Epigastric pain: Secondary | ICD-10-CM

## 2024-01-25 ENCOUNTER — Other Ambulatory Visit (HOSPITAL_COMMUNITY): Payer: Self-pay

## 2024-01-25 ENCOUNTER — Encounter (HOSPITAL_COMMUNITY): Payer: Self-pay

## 2024-01-25 MED ORDER — GABAPENTIN 300 MG PO CAPS
300.0000 mg | ORAL_CAPSULE | Freq: Every day | ORAL | 1 refills | Status: DC
  Filled 2024-01-25 – 2024-02-05 (×2): qty 30, 30d supply, fill #0
  Filled 2024-02-15 – 2024-03-05 (×3): qty 30, 30d supply, fill #1

## 2024-01-26 ENCOUNTER — Other Ambulatory Visit: Payer: Self-pay

## 2024-01-26 ENCOUNTER — Other Ambulatory Visit (HOSPITAL_COMMUNITY): Payer: Self-pay

## 2024-01-27 ENCOUNTER — Other Ambulatory Visit (HOSPITAL_COMMUNITY): Payer: Self-pay

## 2024-01-28 ENCOUNTER — Other Ambulatory Visit: Payer: Self-pay

## 2024-02-05 ENCOUNTER — Other Ambulatory Visit (HOSPITAL_COMMUNITY): Payer: Self-pay

## 2024-02-06 ENCOUNTER — Other Ambulatory Visit (HOSPITAL_COMMUNITY): Payer: Self-pay

## 2024-02-06 ENCOUNTER — Other Ambulatory Visit: Payer: Self-pay

## 2024-02-15 ENCOUNTER — Other Ambulatory Visit (HOSPITAL_COMMUNITY): Payer: Self-pay

## 2024-02-15 ENCOUNTER — Other Ambulatory Visit: Payer: Self-pay

## 2024-02-15 ENCOUNTER — Encounter: Payer: Self-pay | Admitting: Hematology and Oncology

## 2024-02-26 ENCOUNTER — Other Ambulatory Visit: Payer: Self-pay

## 2024-02-26 ENCOUNTER — Other Ambulatory Visit (HOSPITAL_COMMUNITY): Payer: Self-pay

## 2024-03-02 ENCOUNTER — Other Ambulatory Visit (HOSPITAL_COMMUNITY): Payer: Self-pay

## 2024-03-04 ENCOUNTER — Other Ambulatory Visit: Payer: Self-pay

## 2024-03-04 ENCOUNTER — Other Ambulatory Visit (HOSPITAL_COMMUNITY): Payer: Self-pay

## 2024-03-06 ENCOUNTER — Other Ambulatory Visit (HOSPITAL_COMMUNITY): Payer: Self-pay

## 2024-03-06 ENCOUNTER — Other Ambulatory Visit: Payer: Self-pay

## 2024-03-13 ENCOUNTER — Other Ambulatory Visit: Payer: Self-pay | Admitting: Family Medicine

## 2024-03-14 ENCOUNTER — Encounter: Payer: Self-pay | Admitting: Hematology and Oncology

## 2024-03-14 ENCOUNTER — Other Ambulatory Visit: Payer: Self-pay

## 2024-03-14 ENCOUNTER — Other Ambulatory Visit (HOSPITAL_COMMUNITY): Payer: Self-pay

## 2024-03-18 ENCOUNTER — Encounter: Payer: Self-pay | Admitting: Hematology and Oncology

## 2024-03-22 ENCOUNTER — Other Ambulatory Visit: Payer: Self-pay | Admitting: Family Medicine

## 2024-03-22 ENCOUNTER — Other Ambulatory Visit (HOSPITAL_COMMUNITY): Payer: Self-pay

## 2024-03-22 ENCOUNTER — Other Ambulatory Visit: Payer: Self-pay | Admitting: Internal Medicine

## 2024-03-23 ENCOUNTER — Other Ambulatory Visit (HOSPITAL_COMMUNITY): Payer: Self-pay

## 2024-03-23 ENCOUNTER — Encounter: Payer: Self-pay | Admitting: Hematology and Oncology

## 2024-03-25 ENCOUNTER — Other Ambulatory Visit (HOSPITAL_COMMUNITY): Payer: Self-pay

## 2024-03-25 ENCOUNTER — Encounter (HOSPITAL_COMMUNITY): Payer: Self-pay | Admitting: Psychiatry

## 2024-03-25 ENCOUNTER — Other Ambulatory Visit: Payer: Self-pay

## 2024-03-25 ENCOUNTER — Telehealth (HOSPITAL_BASED_OUTPATIENT_CLINIC_OR_DEPARTMENT_OTHER): Admitting: Psychiatry

## 2024-03-25 VITALS — Wt 220.0 lb

## 2024-03-25 DIAGNOSIS — F411 Generalized anxiety disorder: Secondary | ICD-10-CM | POA: Diagnosis not present

## 2024-03-25 DIAGNOSIS — F5101 Primary insomnia: Secondary | ICD-10-CM | POA: Diagnosis not present

## 2024-03-25 DIAGNOSIS — F319 Bipolar disorder, unspecified: Secondary | ICD-10-CM | POA: Diagnosis not present

## 2024-03-25 MED ORDER — PROMETHAZINE HCL 12.5 MG PO TABS
12.5000 mg | ORAL_TABLET | Freq: Three times a day (TID) | ORAL | 0 refills | Status: DC | PRN
  Filled 2024-03-25 – 2024-04-07 (×2): qty 30, 10d supply, fill #0

## 2024-03-25 MED ORDER — LORAZEPAM 1 MG PO TABS
1.0000 mg | ORAL_TABLET | Freq: Three times a day (TID) | ORAL | 0 refills | Status: DC | PRN
  Filled 2024-03-25: qty 90, 30d supply, fill #0

## 2024-03-25 MED ORDER — POTASSIUM CHLORIDE CRYS ER 20 MEQ PO TBCR
20.0000 meq | EXTENDED_RELEASE_TABLET | Freq: Every day | ORAL | 0 refills | Status: DC
  Filled 2024-03-25 – 2024-03-31 (×2): qty 30, 30d supply, fill #0

## 2024-03-25 MED ORDER — SERTRALINE HCL 50 MG PO TABS
75.0000 mg | ORAL_TABLET | Freq: Every day | ORAL | 0 refills | Status: DC
  Filled 2024-03-25: qty 45, 30d supply, fill #0

## 2024-03-25 NOTE — Progress Notes (Signed)
 [facility] MD Virtual Progress Note   Patient Location: In Car Provider Location: Home Office  I connect with patient by video and verified that I am speaking with correct person by using two identifiers. I discussed the limitations of evaluation and management by telemedicine and the availability of in person appointments. I also discussed with the patient that there may be a patient responsible charge related to this service. The patient expressed understanding and agreed to proceed.  Sandra Miller [id number] 48 y.o.  03/25/2024 3:46 PM  History of Present Illness:  Patient is evaluated by video session.  She does back from work.  She reported not doing very well and started to have worsening of symptoms.  She been easily irritable, agitated and exhausted.  She is not motivated to do things.  She has not seen or watch television in a while.  She stays in a dark room by herself.  She gets easily emotional, tearful and fatigued.  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  She is trying to pick up the hobbies and recently bought the books but could not finish as lack of motivation to do things.  She is concerned about her mother who diagnosed with lung cancer.  We started her on Topamax  to help her anxiety and insomnia but she has not seen a significant improvement.  Her appetite is same and weight is unchanged from the past.  She is struggled with sleep.  She recalled last week very irritable and agitated with the coworker and she took a day off to calm herself.  She denies any hallucination, paranoia but admitted hopeless.  She denies any suicidal thoughts or homicidal thoughts.  She is on Zoloft  150 mg a day, Ativan  1 mg 3 times a day and Topamax  25 mg at bedtime.  She is also on seizure medicine.  She had no seizures since last July.  She is seeing neurology.  She denies drinking or using any  illegal substances.  Past Psychiatric History: H/O bipolar disorder, alcohol dependence and marijuana abuse.  H/O at least 6 inpatient. Tried lithium, Seroquel, Lamictal , Paxil, Celexa, Abilify, Geodon , Klonopin, Depakote, Neurontin  and Cymbalta.  Trintellix  caused nausea. No H/o history of suicidal attempt.  Olanzapine  helped but it was discontinued in July 2024 due to abnormal EKG. Prozac  helped for while until stopped working.  Topamax  stopped due to inefficiency.   Outpatient Encounter Medications as of 03/25/2024  Medication Sig   acetaminophen  (TYLENOL ) 500 MG tablet Take 500 mg by mouth every 6 (six) hours as needed.   albuterol (VENTOLIN HFA) 108 (90 Base) MCG/ACT inhaler Inhale 2 puffs into the lungs every 6 (six) hours as needed for wheezing or shortness of breath.   famotidine  (PEPCID ) 40 MG tablet Take 1 tablet (40 mg total) by mouth daily.   fluconazole  (DIFLUCAN ) 150 MG tablet Take 1 tablet (150 mg total) by mouth daily.   Fremanezumab -vfrm (AJOVY ) 225 MG/1.5ML SOAJ Inject 225 mg into the skin every 30 (thirty) days.   gabapentin  (NEURONTIN ) 300 MG capsule Take 1 capsule (300 mg) by mouth daily.   LamoTRIgine  (LAMICTAL  XR) 200 MG TB24 24 hour tablet Take 1 tablet (200 mg total) by mouth at bedtime.   letrozole  (FEMARA ) 2.5 MG tablet Take 1 tablet by mouth daily.   lisinopril  (ZESTRIL ) 10 MG tablet Take 1 tablet (10 mg total) by mouth daily.  Stop the Lisinopril /HCTZ combination   loratadine (CLARITIN REDITABS) 10 MG dissolvable tablet Take 10 mg by mouth daily. OTC   LORazepam  (ATIVAN ) 1 MG tablet Take 1 tablet (1 mg total) by mouth 3 (three) times daily as needed for anxiety   Magnesium  400 MG TABS Take 800 mg by mouth 2 (two) times daily.   methimazole  (TAPAZOLE ) 5 MG tablet Take 0.5 tablets (2.5 mg total) by mouth daily.   Multiple Vitamin (MULTIVITAMIN WITH MINERALS) TABS tablet Take 1 tablet by mouth daily.   omeprazole  (PRILOSEC) 40 MG capsule Take 1 capsule (40 mg total) by  mouth daily before breakfast.   potassium chloride  SA (KLOR-CON  M) 20 MEQ tablet Take 1 tablet (20 mEq total) by mouth daily. Please schedule appointment with PCP/Dr. Vicci for more refills.   promethazine  (PHENERGAN ) 12.5 MG tablet Take 1 tablet (12.5 mg total) by mouth every 8 (eight) hours as needed for nausea or vomiting.   propranolol  (INDERAL ) 10 MG tablet Take 1 tablet by mouth 2 times daily.   sertraline  (ZOLOFT ) 50 MG tablet Take 1.5 tablets (75 mg total) by mouth daily.   spironolactone  (ALDACTONE ) 25 MG tablet Take 0.5 tablets (12.5 mg total) by mouth daily.   topiramate  (TOPAMAX ) 25 MG tablet Take 1 tablet (25 mg total) by mouth daily.   Ubrogepant  (UBRELVY ) 50 MG TABS Take 50 mg as needed for migraine, a second dose may be taken at least 2 hours after the initial dose. DO NOT EXCEED 2 doses in 24 hours.   No facility-administered encounter medications on file as of 03/25/2024.    No results found for this or any previous visit (from the past 2160 hours).   Psychiatric Specialty Exam: Physical Exam  Review of Systems  Constitutional:  Positive for fatigue.  Psychiatric/Behavioral:  Positive for sleep disturbance.     Weight 220 lb (99.8 kg).There is no height or weight on file to calculate BMI.  General Appearance: Casual  Eye Contact:  Fair  Speech:  Slow  Volume:  Decreased  Mood:  Depressed, Dysphoric, Irritable, and tired  Affect:  Constricted and Depressed  Thought Process:  Descriptions of Associations: Intact  Orientation:  Full (Time, Place, and Person)  Thought Content:  Rumination  Suicidal Thoughts:  No  Homicidal Thoughts:  No  Memory:  Immediate;   Good Recent;   Good Remote;   Good  Judgement:  Fair  Insight:  Present  Psychomotor Activity:  Decreased  Concentration:  Concentration: Fair and Attention Span: Fair  Recall:  [employer] of Knowledge:  Fair  Language:  Good  Akathisia:  No  Handed:  Right  AIMS (if indicated):     Assets:   Communication Skills Desire for Improvement Housing Social Support Talents/Skills Transportation  ADL's:  Intact  Cognition:  WNL  Sleep:  fair       12/22/2023   11:09 AM 10/02/2023    2:47 PM 09/25/2023    9:52 AM 08/01/2023    3:53 PM 06/07/2023   10:29 AM  Depression screen PHQ 2/9  Decreased Interest 1 3 3 3 3   Down, Depressed, Hopeless 1 3 3 3 3   PHQ - 2 Score 2 6 6 6 6   Altered sleeping 1 3 3 3  0  Tired, decreased energy 1 3 3 3 2   Change in appetite 0 3 3 3 2   Feeling bad or failure about yourself  0 3 3 3 3   Trouble concentrating 0 3 3 3 3   Moving  slowly or fidgety/restless 0 0 3 0 0  Suicidal thoughts 0 1 3 0 3  PHQ-9 Score 4 22 27 21 19   Difficult doing work/chores Not difficult at all Extremely dIfficult Extremely dIfficult Extremely dIfficult Extremely dIfficult    Assessment/Plan: Bipolar 1 disorder (HCC) - Plan: sertraline  (ZOLOFT ) 50 MG tablet  GAD (generalized anxiety disorder) - Plan: LORazepam  (ATIVAN ) 1 MG tablet, sertraline  (ZOLOFT ) 50 MG tablet  Primary insomnia - Plan: LORazepam  (ATIVAN ) 1 MG tablet  Patient is 48 year old female with history of hypertension, chronic migraine, GERD, hypothyroidism, Graves' disease, prolonged QT interval, seizures, generalized anxiety disorder, bipolar disorder and primary insomnia currently on Zoloft  75 mg daily, Ativan  1 mg 3 times a day.  She is also on moderate dose of antiseizure medicine.  Recommend to discontinue Topamax  since this is the last medicine we added and it did not help her anxiety and insomnia.  Discussed to get GeneSight testing and if appropriate may consider Rexulti.  I will also emphasized to see a therapist and we will refer her for counseling.  We discussed PHP/IOP but patient prefer one-to-one therapy.  Will follow-up in 2 weeks once we have results available for GeneSight testing.  Discussed safety concerns at any time having active suicidal thoughts or homicidal thoughts that she need to call 911 or  go to local emergency room.   Follow Up Instructions:     I discussed the assessment and treatment plan with the patient. The patient was provided an opportunity to ask questions and all were answered. The patient agreed with the plan and demonstrated an understanding of the instructions.   The patient was advised to call back or seek an in-person evaluation if the symptoms worsen or if the condition fails to improve as anticipated.    Collaboration of Care: Other provider involved in patient's care AEB notes are available in epic to review  Patient/Guardian was advised Release of Information must be obtained prior to any record release in order to collaborate their care with an outside provider. Patient/Guardian was advised if they have not already done so to contact the registration department to sign all necessary forms in order for us  to release information regarding their care.   Consent: Patient/Guardian gives verbal consent for treatment and assignment of benefits for services provided during this visit. Patient/Guardian expressed understanding and agreed to proceed.     Total encounter time 25 minutes which includes face-to-face time, chart reviewed, care coordination, order entry and documentation during this encounter.   Note: This document was prepared by [employer] voice dictation technology and any errors that results from this process are unintentional.    Leni ONEIDA Client, MD 03/25/2024

## 2024-03-26 ENCOUNTER — Other Ambulatory Visit: Payer: Self-pay

## 2024-03-28 ENCOUNTER — Telehealth: Payer: Self-pay

## 2024-03-28 ENCOUNTER — Other Ambulatory Visit (HOSPITAL_COMMUNITY): Payer: Self-pay

## 2024-03-28 NOTE — Telephone Encounter (Signed)
 Pharmacy Patient Advocate Encounter   Received notification from CoverMyMeds that prior authorization for Ubrelvy  50MG  tablets is required/requested.   Insurance verification completed.   The patient is insured through [employer] .   Per test claim: PA required; PA submitted to above mentioned insurance via CoverMyMeds Key/confirmation #/EOC (Key: [id number])   Status is pending

## 2024-03-28 NOTE — Telephone Encounter (Signed)
 Pharmacy Patient Advocate Encounter  Received notification from [facility] that Prior Authorization for Ubrelvy  50MG  tablets has been APPROVED from 03/28/2024 to 03/28/2025. Ran test claim, Copay is $0. This test claim was processed through [facility]- copay amounts may vary at other pharmacies due to pharmacy/plan contracts, or as the patient moves through the different stages of their insurance plan.   PA #/Case ID/Reference #: PA Case ID #: [id number]

## 2024-03-29 ENCOUNTER — Ambulatory Visit (HOSPITAL_COMMUNITY): Payer: Self-pay

## 2024-03-29 ENCOUNTER — Other Ambulatory Visit: Payer: Self-pay

## 2024-03-30 ENCOUNTER — Other Ambulatory Visit: Payer: Self-pay | Admitting: Physician Assistant

## 2024-03-30 DIAGNOSIS — R1013 Epigastric pain: Secondary | ICD-10-CM

## 2024-03-31 ENCOUNTER — Encounter (HOSPITAL_COMMUNITY): Payer: Self-pay

## 2024-03-31 ENCOUNTER — Other Ambulatory Visit: Payer: Self-pay | Admitting: Internal Medicine

## 2024-03-31 DIAGNOSIS — I10 Essential (primary) hypertension: Secondary | ICD-10-CM

## 2024-04-01 ENCOUNTER — Encounter: Payer: Self-pay | Admitting: Hematology and Oncology

## 2024-04-01 ENCOUNTER — Other Ambulatory Visit: Payer: Self-pay

## 2024-04-01 ENCOUNTER — Ambulatory Visit (HOSPITAL_COMMUNITY): Payer: Self-pay

## 2024-04-01 ENCOUNTER — Other Ambulatory Visit (HOSPITAL_COMMUNITY): Payer: Self-pay

## 2024-04-01 DIAGNOSIS — F319 Bipolar disorder, unspecified: Secondary | ICD-10-CM

## 2024-04-01 MED ORDER — GABAPENTIN 300 MG PO CAPS
300.0000 mg | ORAL_CAPSULE | Freq: Every day | ORAL | 1 refills | Status: DC
  Filled 2024-04-01 – 2024-04-03 (×2): qty 30, 30d supply, fill #0
  Filled 2024-05-01: qty 30, 30d supply, fill #1

## 2024-04-01 MED ORDER — LISINOPRIL 10 MG PO TABS
10.0000 mg | ORAL_TABLET | Freq: Every day | ORAL | 0 refills | Status: DC
  Filled 2024-04-01: qty 30, 30d supply, fill #0

## 2024-04-01 NOTE — Progress Notes (Signed)
 Patient came in this morning to complete her Genesight testing.

## 2024-04-03 ENCOUNTER — Other Ambulatory Visit: Payer: Self-pay | Admitting: Internal Medicine

## 2024-04-03 ENCOUNTER — Other Ambulatory Visit: Payer: Self-pay

## 2024-04-03 ENCOUNTER — Other Ambulatory Visit (HOSPITAL_COMMUNITY): Payer: Self-pay

## 2024-04-03 DIAGNOSIS — E0590 Thyrotoxicosis, unspecified without thyrotoxic crisis or storm: Secondary | ICD-10-CM

## 2024-04-03 MED ORDER — PROPRANOLOL HCL 10 MG PO TABS
10.0000 mg | ORAL_TABLET | Freq: Two times a day (BID) | ORAL | 0 refills | Status: DC
  Filled 2024-04-03: qty 180, 90d supply, fill #0

## 2024-04-08 ENCOUNTER — Other Ambulatory Visit (HOSPITAL_COMMUNITY): Payer: Self-pay

## 2024-04-08 ENCOUNTER — Other Ambulatory Visit: Payer: Self-pay

## 2024-04-11 ENCOUNTER — Encounter (HOSPITAL_COMMUNITY): Payer: Self-pay

## 2024-04-11 ENCOUNTER — Telehealth (HOSPITAL_COMMUNITY): Payer: Self-pay | Admitting: Pharmacy Technician

## 2024-04-11 ENCOUNTER — Encounter (HOSPITAL_COMMUNITY): Payer: Self-pay | Admitting: Psychiatry

## 2024-04-11 ENCOUNTER — Telehealth (HOSPITAL_COMMUNITY): Admitting: Psychiatry

## 2024-04-11 ENCOUNTER — Other Ambulatory Visit: Payer: Self-pay

## 2024-04-11 ENCOUNTER — Other Ambulatory Visit (HOSPITAL_COMMUNITY): Payer: Self-pay

## 2024-04-11 ENCOUNTER — Encounter: Payer: Self-pay | Admitting: Hematology and Oncology

## 2024-04-11 VITALS — Wt 220.0 lb

## 2024-04-11 DIAGNOSIS — F411 Generalized anxiety disorder: Secondary | ICD-10-CM

## 2024-04-11 DIAGNOSIS — F5101 Primary insomnia: Secondary | ICD-10-CM

## 2024-04-11 DIAGNOSIS — F319 Bipolar disorder, unspecified: Secondary | ICD-10-CM

## 2024-04-11 MED ORDER — REXULTI 0.5 MG PO TABS
0.5000 mg | ORAL_TABLET | Freq: Every day | ORAL | 0 refills | Status: DC
  Filled 2024-04-11: qty 30, 30d supply, fill #0

## 2024-04-11 MED ORDER — FLUOXETINE HCL 10 MG PO CAPS
ORAL_CAPSULE | ORAL | 0 refills | Status: DC
  Filled 2024-04-11: qty 60, 30d supply, fill #0

## 2024-04-11 MED ORDER — LORAZEPAM 1 MG PO TABS
1.0000 mg | ORAL_TABLET | Freq: Three times a day (TID) | ORAL | 0 refills | Status: DC | PRN
  Filled 2024-04-11: qty 90, 30d supply, fill #0

## 2024-04-11 MED ORDER — SERTRALINE HCL 25 MG PO TABS
25.0000 mg | ORAL_TABLET | Freq: Every day | ORAL | 0 refills | Status: DC
  Filled 2024-04-11: qty 15, 15d supply, fill #0

## 2024-04-11 NOTE — Progress Notes (Signed)
 [facility] MD Virtual Progress Note   Patient Location: Work Provider Location: Office  I connect with patient by video and verified that I am speaking with correct person by using two identifiers. I discussed the limitations of evaluation and management by telemedicine and the availability of in person appointments. I also discussed with the patient that there may be a patient responsible charge related to this service. The patient expressed understanding and agreed to proceed.  Sandra Miller [id number] 48 y.o.  04/11/2024 1:04 PM  History of Present Illness:  Patient is evaluated by video session.  She is at work.  She reported no changes in her condition.  She remained very stressed, depressed, sad and has no motivation to do things.  She get easily irritable, angry and tearful and fatigued.  She is not sleeping good and usually when she go to bed 10 PM she wake up at 3 AM.  She is having headaches.  She reported her energy level is so low that she does not want to go outside.  She come home and just lie down and sleep.  We have recommended GeneSight testing and we have results.  Her Zoloft  is in moderate section.  She recall Prozac  did help which is in a better section and she took for a while until it stopped working.  She is also on Lamictal  for seizures.  She denies any hallucination, paranoia or any suicidal thoughts but remain impulsive, irritable and having crying spells.  She worried about everything.  She worried about her mother who had diagnosed with lung cancer.  She has no tremor or shakes or any EPS.  We have referred for therapy but patient was busy and not able to contact the referral places which was provided.  Past Psychiatric History: H/O bipolar disorder, alcohol dependence and marijuana abuse.  H/O at least 6 inpatient. Tried lithium, Seroquel, Lamictal , Paxil, Celexa, Abilify, Geodon , Klonopin, Depakote, Neurontin  and Cymbalta.  Trintellix  caused nausea. No  H/o history of suicidal attempt.  Olanzapine  helped but it was discontinued in July 2024 due to abnormal EKG. Prozac  helped for while until stopped working.  Topamax  stopped due to inefficiency.   Past Medical History:  Diagnosis Date   Allergy    Arthritis    Bipolar 1 disorder (HCC)    Cancer (HCC)    vulva, and breast   Chronic kidney disease    Clotting disorder (HCC)    Complication of anesthesia    wakes up during procedures   Depression    GAD (generalized anxiety disorder)    Genital HSV    currently per pt  no break out 03-22-2016    GERD (gastroesophageal reflux disease)    Graves disease    Hiatal hernia    History of cervical dysplasia    2012 laser ablation   History of esophageal dilatation    for dysphasia -- x2 dilated   History of gastric ulcer    History of Helicobacter pylori infection    remote hx   History of hidradenitis suppurativa    gets all over body intermittantly     History of hypertension    no issue since stopped drinking alcohol 2014   History of kidney stones    History of panic attacks    History of radiation therapy 03/16/2020-05/08/2020   vulva  Dr Lynwood Nasuti   History of radiation therapy 09/09/2020-10/23/2020   left chest wall/left SCV   Dr Lynwood Nasuti   Hyperlipidemia  Hypertension    Iron deficiency anemia    Left ureteral stone    OCD (obsessive compulsive disorder)    PONV (postoperative nausea and vomiting)    Pre-diabetes    PTSD (post-traumatic stress disorder)    Recovering alcoholic in remission (HCC)    since 2014   RLS (restless legs syndrome)    Seizures (HCC)    Smokers' cough (HCC)    Thyroid  disease    Urgency of urination    Yeast infection involving the vagina and surrounding area    secondary to taking antibiotic    Outpatient Encounter Medications as of 04/11/2024  Medication Sig   acetaminophen  (TYLENOL ) 500 MG tablet Take 500 mg by mouth every 6 (six) hours as needed.   albuterol (VENTOLIN HFA) 108  (90 Base) MCG/ACT inhaler Inhale 2 puffs into the lungs every 6 (six) hours as needed for wheezing or shortness of breath.   famotidine  (PEPCID ) 40 MG tablet Take 1 tablet (40 mg total) by mouth daily.   fluconazole  (DIFLUCAN ) 150 MG tablet Take 1 tablet (150 mg total) by mouth daily.   Fremanezumab -vfrm (AJOVY ) 225 MG/1.5ML SOAJ Inject 225 mg into the skin every 30 (thirty) days.   gabapentin  (NEURONTIN ) 300 MG capsule Take 1 capsule (300 mg) by mouth daily.   LamoTRIgine  (LAMICTAL  XR) 200 MG TB24 24 hour tablet Take 1 tablet (200 mg total) by mouth at bedtime.   letrozole  (FEMARA ) 2.5 MG tablet Take 1 tablet by mouth daily.   lisinopril  (ZESTRIL ) 10 MG tablet Take 1 tablet (10 mg total) by mouth daily. Stop the Lisinopril /HCTZ combination   loratadine (CLARITIN REDITABS) 10 MG dissolvable tablet Take 10 mg by mouth daily. OTC   LORazepam  (ATIVAN ) 1 MG tablet Take 1 tablet (1 mg total) by mouth 3 (three) times daily as needed for anxiety   Magnesium  400 MG TABS Take 800 mg by mouth 2 (two) times daily.   methimazole  (TAPAZOLE ) 5 MG tablet Take 0.5 tablets (2.5 mg total) by mouth daily.   Multiple Vitamin (MULTIVITAMIN WITH MINERALS) TABS tablet Take 1 tablet by mouth daily.   omeprazole  (PRILOSEC) 40 MG capsule Take 1 capsule (40 mg total) by mouth daily before breakfast.   potassium chloride  SA (KLOR-CON  M) 20 MEQ tablet Take 1 tablet (20 mEq total) by mouth daily. Please schedule appointment with PCP/Dr. Vicci for more refills.   promethazine  (PHENERGAN ) 12.5 MG tablet Take 1 tablet (12.5 mg total) by mouth every 8 (eight) hours as needed for nausea or vomiting.   propranolol  (INDERAL ) 10 MG tablet Take 1 tablet by mouth 2 times daily.   sertraline  (ZOLOFT ) 50 MG tablet Take 1.5 tablets (75 mg total) by mouth daily.   spironolactone  (ALDACTONE ) 25 MG tablet Take 0.5 tablets (12.5 mg total) by mouth daily.   Ubrogepant  (UBRELVY ) 50 MG TABS Take 50 mg as needed for migraine, a second dose may  be taken at least 2 hours after the initial dose. DO NOT EXCEED 2 doses in 24 hours.   No facility-administered encounter medications on file as of 04/11/2024.    No results found for this or any previous visit (from the past 2160 hours).   Psychiatric Specialty Exam: Physical Exam  Review of Systems  Constitutional:  Positive for fatigue.  Psychiatric/Behavioral:  Positive for dysphoric mood and sleep disturbance.     Weight 220 lb (99.8 kg).There is no height or weight on file to calculate BMI.  General Appearance: Hospital scrubs  Eye Contact:  Fair  Speech:  Slow  Volume:  Decreased  Mood:  Anxious, Depressed, Dysphoric, and Irritable  Affect:  Constricted and Depressed  Thought Process:  Descriptions of Associations: Intact  Orientation:  Full (Time, Place, and Person)  Thought Content:  Rumination  Suicidal Thoughts:  No  Homicidal Thoughts:  No  Memory:  Immediate;   Good Recent;   Good Remote;   Fair  Judgement:  Intact  Insight:  Present  Psychomotor Activity:  Decreased  Concentration:  Concentration: Good and Attention Span: Fair  Recall:  Good  Fund of Knowledge:  Good  Language:  Good  Akathisia:  No  Handed:  Right  AIMS (if indicated):     Assets:  Communication Skills Desire for Improvement Housing Talents/Skills Transportation  ADL's:  Intact  Cognition:  WNL  Sleep: Fair only 4 hours       12/22/2023   11:09 AM 10/02/2023    2:47 PM 09/25/2023    9:52 AM 08/01/2023    3:53 PM 06/07/2023   10:29 AM  Depression screen PHQ 2/9  Decreased Interest 1 3 3 3 3   Down, Depressed, Hopeless 1 3 3 3 3   PHQ - 2 Score 2 6 6 6 6   Altered sleeping 1 3 3 3  0  Tired, decreased energy 1 3 3 3 2   Change in appetite 0 3 3 3 2   Feeling bad or failure about yourself  0 3 3 3 3   Trouble concentrating 0 3 3 3 3   Moving slowly or fidgety/restless 0 0 3 0 0  Suicidal thoughts 0 1 3 0 3  PHQ-9 Score 4 22 27 21 19   Difficult doing work/chores Not difficult at all  Extremely dIfficult Extremely dIfficult Extremely dIfficult Extremely dIfficult    Assessment/Plan: Bipolar 1 disorder (HCC) - Plan: sertraline  (ZOLOFT ) 25 MG tablet, Brexpiprazole  (REXULTI ) 0.5 MG TABS, FLUoxetine  (PROZAC ) 10 MG capsule  GAD (generalized anxiety disorder) - Plan: LORazepam  (ATIVAN ) 1 MG tablet, sertraline  (ZOLOFT ) 25 MG tablet, FLUoxetine  (PROZAC ) 10 MG capsule  Primary insomnia - Plan: LORazepam  (ATIVAN ) 1 MG tablet, FLUoxetine  (PROZAC ) 10 MG capsule  Patient is 48 year old female with history of hypertension, chronic migraine, GERD, hypothyroidism, seizures, Graves' disease, prolonged QT interval, generalized anxiety disorder, bipolar disorder and primary insomnia.  Reviewed GeneSight testing results with her.  Recommend taper down Zoloft  and take 25 mg for 2 weeks and then discontinue.  We will start Prozac  10 mg daily for 1 week and then 20 mg daily.  She had a good response with the Prozac  until it stopped working.  Will try Rexulti  0.5 mg daily.  Emphasis given to follow-up on therapy referral which was provided on the last visit.  I also encouraged to pick up the GeneSight testing results from our office.  Will follow-up in 4 weeks.  I also encouraged to call back if she is any question, concern or if she feels worsening of the symptoms.   Follow Up Instructions:     I discussed the assessment and treatment plan with the patient. The patient was provided an opportunity to ask questions and all were answered. The patient agreed with the plan and demonstrated an understanding of the instructions.   The patient was advised to call back or seek an in-person evaluation if the symptoms worsen or if the condition fails to improve as anticipated.    Collaboration of Care: Other provider involved in patient's care AEB notes are available in epic to review  Patient/Guardian was advised Release of  Information must be obtained prior to any record release in order to collaborate  their care with an outside provider. Patient/Guardian was advised if they have not already done so to contact the registration department to sign all necessary forms in order for us  to release information regarding their care.   Consent: Patient/Guardian gives verbal consent for treatment and assignment of benefits for services provided during this visit. Patient/Guardian expressed understanding and agreed to proceed.     Total encounter time 27 minutes which includes face-to-face time, chart reviewed, care coordination, order entry and documentation during this encounter.   Note: This document was prepared by [employer] voice dictation technology and any errors that results from this process are unintentional.    Leni ONEIDA Client, MD 04/11/2024

## 2024-04-12 ENCOUNTER — Telehealth: Payer: Self-pay

## 2024-04-12 ENCOUNTER — Other Ambulatory Visit (HOSPITAL_COMMUNITY): Payer: Self-pay

## 2024-04-12 NOTE — Telephone Encounter (Signed)
 Pharmacy Patient Advocate Encounter  Received notification from [facility] that Prior Authorization for AJOVY  (fremanezumab -vfrm) injection 225MG /1.5ML auto-injectors has been APPROVED from 04/12/2024 to 04/12/2025   PA #/Case ID/Reference #: PA Case ID #: [id number]

## 2024-04-12 NOTE — Telephone Encounter (Signed)
 Pharmacy Patient Advocate Encounter   Received notification from Patient Pharmacy that prior authorization for AJOVY  (fremanezumab -vfrm) injection 225MG /1.5ML auto-injectors is required/requested.   Insurance verification completed.   The patient is insured through [facility] .   Per test claim: PA required; PA submitted to above mentioned insurance via CoverMyMeds Key/confirmation #/[facility] Status is pending

## 2024-04-13 ENCOUNTER — Encounter (HOSPITAL_COMMUNITY): Payer: Self-pay

## 2024-04-13 ENCOUNTER — Other Ambulatory Visit (HOSPITAL_COMMUNITY): Payer: Self-pay

## 2024-04-15 ENCOUNTER — Other Ambulatory Visit: Payer: Self-pay

## 2024-04-15 ENCOUNTER — Other Ambulatory Visit (HOSPITAL_COMMUNITY): Payer: Self-pay

## 2024-04-15 ENCOUNTER — Telehealth (HOSPITAL_COMMUNITY): Payer: Self-pay | Admitting: *Deleted

## 2024-04-15 ENCOUNTER — Encounter: Payer: Self-pay | Admitting: Neurology

## 2024-04-15 NOTE — Telephone Encounter (Signed)
 Any psychiatric medication can trigger seizures.  Rexulti  is the same.  She really need to contact the neurologist.

## 2024-04-15 NOTE — Telephone Encounter (Signed)
 She is also concerned about the Rexulti 

## 2024-04-15 NOTE — Telephone Encounter (Signed)
 Any psychiatric medication can trigger seizures.  She had took the Prozac  in the past with good effect.  She is not doing very well Zoloft .  She can check with the neurology and if neurology has no concern then she should take the Prozac .

## 2024-04-15 NOTE — Telephone Encounter (Signed)
 Pt called to advise that she is being titrated off Zoloft  and titrated up on Prozac  and started on Rexulti  0.5 mg as of last visit 04/11/24. Pt says she looked up both medications and read that they both can trigger seizures and she is concerned about this as she has a h/o seizures. Pt also reached out to neurology, Dr. Ines, but would like your opinion as well. Please review and advise.

## 2024-04-16 ENCOUNTER — Encounter (HOSPITAL_COMMUNITY): Payer: Self-pay | Admitting: *Deleted

## 2024-04-24 ENCOUNTER — Other Ambulatory Visit (HOSPITAL_COMMUNITY): Payer: Self-pay

## 2024-04-25 ENCOUNTER — Other Ambulatory Visit: Payer: Self-pay | Admitting: Internal Medicine

## 2024-04-25 MED ORDER — POTASSIUM CHLORIDE CRYS ER 20 MEQ PO TBCR
20.0000 meq | EXTENDED_RELEASE_TABLET | Freq: Every day | ORAL | 0 refills | Status: DC
  Filled 2024-04-25: qty 30, 30d supply, fill #0

## 2024-04-26 ENCOUNTER — Other Ambulatory Visit (HOSPITAL_COMMUNITY): Payer: Self-pay

## 2024-04-27 ENCOUNTER — Encounter: Payer: Self-pay | Admitting: Hematology and Oncology

## 2024-04-27 ENCOUNTER — Other Ambulatory Visit (HOSPITAL_COMMUNITY): Payer: Self-pay

## 2024-05-01 ENCOUNTER — Other Ambulatory Visit (HOSPITAL_COMMUNITY): Payer: Self-pay

## 2024-05-07 ENCOUNTER — Ambulatory Visit: Attending: Internal Medicine | Admitting: Internal Medicine

## 2024-05-07 ENCOUNTER — Encounter: Payer: Self-pay | Admitting: Internal Medicine

## 2024-05-07 ENCOUNTER — Ambulatory Visit: Payer: Self-pay

## 2024-05-07 VITALS — BP 143/89 | HR 104 | Temp 98.6°F | Ht 66.0 in | Wt 222.0 lb

## 2024-05-07 DIAGNOSIS — F17200 Nicotine dependence, unspecified, uncomplicated: Secondary | ICD-10-CM | POA: Diagnosis not present

## 2024-05-07 DIAGNOSIS — F332 Major depressive disorder, recurrent severe without psychotic features: Secondary | ICD-10-CM

## 2024-05-07 DIAGNOSIS — R911 Solitary pulmonary nodule: Secondary | ICD-10-CM

## 2024-05-07 DIAGNOSIS — R002 Palpitations: Secondary | ICD-10-CM | POA: Diagnosis not present

## 2024-05-07 DIAGNOSIS — Z23 Encounter for immunization: Secondary | ICD-10-CM | POA: Diagnosis not present

## 2024-05-07 DIAGNOSIS — F333 Major depressive disorder, recurrent, severe with psychotic symptoms: Secondary | ICD-10-CM

## 2024-05-07 DIAGNOSIS — E0590 Thyrotoxicosis, unspecified without thyrotoxic crisis or storm: Secondary | ICD-10-CM

## 2024-05-07 DIAGNOSIS — R7303 Prediabetes: Secondary | ICD-10-CM

## 2024-05-07 DIAGNOSIS — F411 Generalized anxiety disorder: Secondary | ICD-10-CM

## 2024-05-07 NOTE — Progress Notes (Signed)
 "   Patient ID: Sandra Miller, female    DOB: 06/16/1976  MRN: [id number]  CC: Palpitations (Palpitations X4 days/SOB X2 mo/Worsening headaches/Endo d/c methimazole /Discuss nodule on lung/Flu vax administered on 05/07/2024 - [facility] schedules mammogram.)   Subjective: Sandra Miller is a 48 y.o. female who presents for chronic ds management. Her concerns today include:  Patient with history of HTN, HL, preDM, hypokalemia,  obesity, tob dep, RLS, DVT of the RT IJV and surrounding IJ port 04/2020 GERD/functional dyspepsia (on gabapentin  and  omeprazole ), hidradenitis suppurativa, breast CA left dx 02/2020 (ERP s/p left mastectomy, neoadjuvant chemotherapy and adjuvant XRT; Dr. Gudena).  Vulvar cancer (dx 01/2020), HSV infection, genitial warts, bipolar disorder/dep/anx (Dr. Curry), has applied for disability, Hyperthyroid   Discussed the use of AI scribe software for clinical note transcription with the patient, who gave verbal consent to proceed.  History of Present Illness Sandra Miller is a 48 year old female with anxiety disorder and thyroid  issues who presents with heart palpitations.  She has been experiencing heart palpitations for the past four days, severe enough to cause her to take time off work. Associated symptoms include panting, panic, clammy hands, and sweating feet. Her heart rate has reached 130-135 bpm according to her smartwatch, and her heart rate variability has been low six times in the past 30 days. Saw cardiology and wore zio patch for 2 wks the end of last year that revealed no significant arrhythmia. Still being followed by Dr. Arfeen for bipolar, MDD and GAD.   -She has started two new medications: Rexulti  and Prozac , while being weaned off Zoloft , with her last dose scheduled for is this Thursday. She is unsure if these medication changes are contributing to her symptoms.  Her endocrinologist weaned her off methimazole . Stopped taking it at the end of July after her  labs were reviewed.  She denies unexplained weight loss but mentions weight gain, noting a recent weight of 222 pounds, down from 228 pounds at work. She has a history of anxiety disorder, which exacerbates her symptoms during palpitations.  She is concerned about a lung nodule first noted in 2021, which she discovered when reviewing her own test results. Her concern is heightened due to her mother's recent lung cancer diagnosis and her own history of smoking, which she resumed 4 mths ago due to increase stress. She smokes nearly a pack a day and is interested in quitting, having tried patches and gum in the past, she purchased some recently but decided not to use after she read instructions that they should not be used if there is a hx of sz disorder. She is concerned about potential seizure risks due to her history of seizure last year. Her neurologist thinks sz may have been provoked due to lyte abnormalities and dehydration.  However she remains on Lamictal  to help with her mood disorder.  She has a history of prediabetes and is concerned about potential progression to diabetes, requesting screening for this during her visit.  HM: yes to flu and shingles vaccine. Cardiac CT done 11/2023 showed an incidental finding of 4 mm right upper lobe lung nodule unchanged from 2021 on PET.  Radiologist stated that this is likely a benign process given its stability over 4 years.    Patient Active Problem List   Diagnosis Date Noted   Chronic migraine without aura, with intractable migraine, so stated, with status migrainosus 08/22/2023   Mood disorder (HCC) 05/29/2023   Fatigue 05/15/2023   Prolonged QT  interval 04/10/2023   Dizziness 04/10/2023   Provoked seizures (HCC) 03/31/2023   Respiratory depression 03/31/2023   Status epilepticus (HCC) 03/31/2023   Migraine with aura and without status migrainosus, not intractable 10/03/2022   Pain in right knee 09/07/2022   Bilateral primary osteoarthritis of  knee 09/07/2022   Chronic migraine without aura without status migrainosus, not intractable 07/07/2022   Graves disease 05/10/2022   Sebaceous cyst 12/06/2021   Pain in left knee 11/03/2021   Hyperthyroidism 08/13/2021   Bipolar 1 disorder (HCC) 05/07/2021   Obesity (BMI 30.0-34.9) 05/07/2021   Hyperlipidemia 08/12/2020   Prediabetes 08/12/2020   Hypomagnesemia 08/12/2020   Mutation in BRIP1 gene 06/24/2020   Hypokalemia 06/17/2020   Dehydration    Colitis 06/16/2020   Port-A-Cath in place 03/11/2020   Malignant neoplasm of upper-inner quadrant of left breast in female, estrogen receptor positive (HCC) 02/25/2020   Vulvar cancer (HCC) 01/29/2020   Vulvar ulceration 01/23/2020   Essential hypertension 10/31/2019   Nausea and vomiting 12/27/2018   Seasonal allergic rhinitis 12/27/2018   History of ELISA positive for HSV 10/01/2018   Restless leg syndrome 04/01/2016   Nephrolithiasis 03/08/2016   Obstructive pyelonephritis 03/08/2016   Normocytic anemia 03/08/2016   Hidradenitis suppurativa of left axilla 11/03/2015   Loss of weight 09/08/2015   Neck pain on left side 10/22/2014   Genital warts 05/20/2014   GERD (gastroesophageal reflux disease) 12/26/2012   Functional dyspepsia 12/26/2012   Tobacco abuse 08/22/2012   Herpes simplex virus (HSV) infection 02/03/2007     Current Outpatient Medications on File Prior to Visit  Medication Sig Dispense Refill   acetaminophen  (TYLENOL ) 500 MG tablet Take 500 mg by mouth every 6 (six) hours as needed.     albuterol  (VENTOLIN  HFA) 108 (90 Base) MCG/ACT inhaler Inhale 2 puffs into the lungs every 6 (six) hours as needed for wheezing or shortness of breath.     Brexpiprazole  (REXULTI ) 0.5 MG TABS Take 1 tablet (0.5 mg total) by mouth daily. 30 tablet 0   famotidine  (PEPCID ) 40 MG tablet Take 1 tablet (40 mg total) by mouth daily. 90 tablet 3   FLUoxetine  (PROZAC ) 10 MG capsule Take one capsule daily for one week and than two capsule  daily 60 capsule 0   Fremanezumab -vfrm (AJOVY ) 225 MG/1.5ML SOAJ Inject 225 mg into the skin every 30 (thirty) days. 1.5 mL 11   gabapentin  (NEURONTIN ) 300 MG capsule Take 1 capsule (300 mg) by mouth daily. 30 capsule 1   LamoTRIgine  (LAMICTAL  XR) 200 MG TB24 24 hour tablet Take 1 tablet (200 mg total) by mouth at bedtime. 90 tablet 4   letrozole  (FEMARA ) 2.5 MG tablet Take 1 tablet by mouth daily. 90 tablet 3   loratadine (CLARITIN REDITABS) 10 MG dissolvable tablet Take 10 mg by mouth daily. OTC     LORazepam  (ATIVAN ) 1 MG tablet Take 1 tablet (1 mg total) by mouth 3 (three) times daily as needed for anxiety 90 tablet 0   Magnesium  400 MG TABS Take 800 mg by mouth 2 (two) times daily. 120 tablet 1   Multiple Vitamin (MULTIVITAMIN WITH MINERALS) TABS tablet Take 1 tablet by mouth daily.     omeprazole  (PRILOSEC) 40 MG capsule Take 1 capsule (40 mg total) by mouth daily before breakfast. 90 capsule 3   potassium chloride  SA (KLOR-CON  M) 20 MEQ tablet Take 1 tablet (20 mEq total) by mouth daily. Must have office visit for refills 30 tablet 0   promethazine  (PHENERGAN ) 12.5 MG  tablet Take 1 tablet (12.5 mg total) by mouth every 8 (eight) hours as needed for nausea or vomiting. 30 tablet 0   propranolol  (INDERAL ) 10 MG tablet Take 1 tablet by mouth 2 times daily. 180 tablet 0   spironolactone  (ALDACTONE ) 25 MG tablet Take 0.5 tablets (12.5 mg total) by mouth daily. 60 tablet 1   Ubrogepant  (UBRELVY ) 50 MG TABS Take 50 mg as needed for migraine, a second dose may be taken at least 2 hours after the initial dose. DO NOT EXCEED 2 doses in 24 hours. 10 tablet 1   sertraline  (ZOLOFT ) 25 MG tablet Take 1 tablet (25 mg total) by mouth daily. For two weeks and than discontinue (Patient not taking: Reported on 05/07/2024) 15 tablet 0   No current facility-administered medications on file prior to visit.    Allergies  Allergen Reactions   Dilaudid  [Hydromorphone  Hcl] Other (See Comments)    Pt became  confused, pulled out iv's, does not remember anything   Fentanyl      Combative, delusional    Zyprexa  [Olanzapine ] Other (See Comments)    QT prolongation   Aspirin Hives    States able to tolerate Goody Powders and Ibuprofen  without any problem    Depakote [Divalproex Sodium] Nausea And Vomiting   Minocycline Hives    Social History   Socioeconomic History   Marital status: Single    Spouse name: Not on file   Number of children: Not on file   Years of education: Not on file   Highest education level: Associate degree: academic program  Occupational History   Not on file  Tobacco Use   Smoking status: Every Day    Current packs/day: 0.50    Types: Cigarettes   Smokeless tobacco: Never   Tobacco comments:    Recently started a smoking cessation class. Quit 03/31/23  Vaping Use   Vaping status: Never Used  Substance and Sexual Activity   Alcohol use: Not Currently   Drug use: Not Currently    Types: Marijuana    Comment: none in about 1 year   Sexual activity: Yes    Partners: Male    Birth control/protection: Surgical  Other Topics Concern   Not on file  Social History Narrative   Former [facility] patient.      Was on disability at one point.   Return to the workforce.  40 hours a week at [employer], 10 hours a week on the weekends at [employer] in [place], [employer] at night 10 to 12 hours a week.      Has grown children, she lives alone with a pet, continues to smoke no alcohol or drug use at this time      History of EtOH abuse and THC use.         Social Drivers of Corporate Investment Banker Strain: Low Risk  (05/07/2024)   Overall Financial Resource Strain (CARDIA)    Difficulty of Paying Living Expenses: Not hard at all  Food Insecurity: No Food Insecurity (05/07/2024)   Hunger Vital Sign    Worried About Running Out of Food in the Last Year: Never true    Ran Out of Food in the Last Year: Never true  Transportation Needs: No Transportation  Needs (05/07/2024)   PRAPARE - Administrator, Civil Service (Medical): No    Lack of Transportation (Non-Medical): No  Physical Activity: Inactive (05/07/2024)   Exercise Vital Sign    Days of Exercise  per Week: 0 days    Minutes of Exercise per Session: 0 min  Stress: Stress Concern Present (05/07/2024)   [employer] of Occupational Health - Occupational Stress Questionnaire    Feeling of Stress: Very much  Social Connections: Socially Isolated (05/07/2024)   Social Connection and Isolation Panel    Frequency of Communication with Friends and Family: More than three times a week    Frequency of Social Gatherings with Friends and Family: Once a week    Attends Religious Services: Never    Database Administrator or Organizations: No    Attends Banker Meetings: Never    Marital Status: Never married  Intimate Partner Violence: Not At Risk (05/07/2024)   Humiliation, Afraid, Rape, and Kick questionnaire    Fear of Current or Ex-Partner: No    Emotionally Abused: No    Physically Abused: No    Sexually Abused: No    Family History  Problem Relation Age of Onset   Heart disease Mother    Depression Mother    Anxiety disorder Mother    Heart disease Father    Lung cancer Father        d. 50   Alcohol abuse Father    Drug abuse Brother    Alcohol abuse Brother    Drug abuse Brother    ADD / ADHD Brother    Colon polyps Brother    Anxiety disorder Maternal Aunt    Depression Maternal Aunt    Kidney disease Maternal Uncle    Lung cancer Paternal Uncle 24   Diabetes Maternal Grandfather    Diabetes Paternal Grandmother    Cancer Paternal Grandfather        stomach   Cirrhosis Cousin        alcoholic   Cancer Cousin        maternal; ovarian cancer or other female cancer?   Throat cancer Cousin        paternal; dx 67s   Lung cancer Cousin        paternal; dx 81s   Breast cancer Cousin        paternal   Breast cancer Cousin        paternal    Colon cancer Neg Hx    Esophageal cancer Neg Hx    Stomach cancer Neg Hx    Rectal cancer Neg Hx     Past Surgical History:  Procedure Laterality Date   CESAREAN SECTION  1995   w/  Bilateral Tubal Ligation   COLONOSCOPY  last one 08-09-2013   CYSTOSCOPY W/ URETERAL STENT PLACEMENT Left 03/29/2016   Procedure: CYSTOSCOPY WITH STENT REPLACEMENT;  Surgeon: Redell Lynwood Napoleon, MD;  Location[facility];  Service: Urology;  Laterality: Left;   CYSTOSCOPY WITH RETROGRADE PYELOGRAM, URETEROSCOPY AND STENT PLACEMENT Left 03/08/2016   Procedure: CYSTOSCOPY WITH  LEFT RETROGRADE PYELOGRAM, AND STENT PLACEMENT;  Surgeon: Redell Lynwood Napoleon, MD;  Location: [facility];  Service: Urology;  Laterality: Left;   CYSTOSCOPY/RETROGRADE/URETEROSCOPY/STONE EXTRACTION WITH BASKET Left 03/29/2016   Procedure: CYSTOSCOPY/RETROGRADE/URETEROSCOPY/STONE EXTRACTION WITH BASKET;  Surgeon: Redell Lynwood Napoleon, MD;  Location: [facility];  Service: Urology;  Laterality: Left;   ENDOMETRIAL ABLATION W/ NOVASURE  04-01-2010   ESOPHAGOGASTRODUODENOSCOPY  last one 08-09-2013   KNEE ARTHROSCOPY Left as teen   LASER ABLATION OF THE CERVIX  2012 approx   MASTECTOMY WITH AXILLARY LYMPH NODE DISSECTION Left 07/27/2020   Procedure: LEFT MASTECTOMY WITH LEFT RADIOACTIVE SEED GUIDED TARGETED  AXILLARY LYMPH NODE DISSECTION;  Surgeon: Ethyl Lenis, MD;  Location: [facility];  Service: General;  Laterality: Left;   PORT-A-CATH REMOVAL Right 05/18/2021   Procedure: REMOVAL PORT-A-CATH;  Surgeon: Belinda Cough, MD;  Location: [facility];  Service: General;  Laterality: Right;   PORTACATH PLACEMENT Right 03/06/2020   Procedure: INSERTION PORT-A-CATH WITH ULTRASOUND GUIDANCE;  Surgeon: Ethyl Lenis, MD;  [facility];  Service: General;  Laterality: Right;   ROBOTIC ASSISTED TOTAL HYSTERECTOMY WITH BILATERAL SALPINGO OOPHERECTOMY N/A 05/18/2021   Procedure: XI ROBOTIC ASSISTED TOTAL HYSTERECTOMY WITH  BILATERAL SALPINGO OOPHORECTOMY;  Surgeon: Eloy Herring, MD;  Location: [facility];  Service: Gynecology;  Laterality: N/A;   TRANSTHORACIC ECHOCARDIOGRAM  05-19-2006   lvsf normal, ef 55-65%, there was mild flattening of the interventricular septum during diastoli/  RV size at upper limits normal   TUBAL LIGATION     VULVA /PERINEUM BIOPSY N/A 05/18/2021   Procedure: VULVAR BIOPSY;  Surgeon: Eloy Herring, MD;  Location: [facility];  Service: Gynecology;  Laterality: N/A;   WISDOM TOOTH EXTRACTION  age 78 's    ROS: Review of Systems Negative except as stated above  PHYSICAL EXAM: BP (!) 143/89 (BP Location: Left Arm, Patient Position: Sitting, Cuff Size: Normal)   Pulse (!) 104   Temp 98.6 F (37 C) (Oral)   Ht 5' 6 (1.676 m)   Wt 222 lb (100.7 kg)   SpO2 100%   BMI 35.83 kg/m   Wt Readings from Last 3 Encounters:  05/07/24 222 lb (100.7 kg)  04/11/24 220 lb (99.8 kg)  03/25/24 220 lb (99.8 kg)    Physical Exam  General appearance - middle age AAF in NAD. Pt has noted wgh gain since I last saw her 09/2023 Mental status - pt tearful when discribing her symptoms, stress and associated anxiety Neck - supple, no significant adenopathy Chest - clear to auscultation, no wheezes, rales or rhonchi, symmetric air entry Heart - slight tachycardia Extremities - no LE edema      Latest Ref Rng & Units 11/09/2023    2:41 PM 11/01/2023   10:33 AM 08/29/2023    6:22 AM  CMP  Glucose 70 - 99 mg/dL 98  898  883   BUN 6 - 20 mg/dL 11  10  11    Creatinine 0.44 - 1.00 mg/dL 9.32  9.28  9.57   Sodium 135 - 145 mmol/L 136  137  135   Potassium 3.5 - 5.1 mmol/L 4.4  5.0  3.6   Chloride 98 - 111 mmol/L 101  98  100   CO2 22 - 32 mmol/L 30  22  23    Calcium  8.9 - 10.3 mg/dL 89.8  89.1  89.9   Total Protein 6.5 - 8.1 g/dL 7.9   8.3   Total Bilirubin 0.0 - 1.2 mg/dL 0.3   1.1   Alkaline Phos 38 - 126 U/L 98   71   AST 15 - 41 U/L 17   22   ALT 0 - 44 U/L 14   15    Lipid Panel     Component  Value Date/Time   CHOL 273 (H) 11/01/2023 1033   TRIG 145 11/01/2023 1033   HDL 70 11/01/2023 1033   CHOLHDL 3.9 11/01/2023 1033   CHOLHDL 4.7 Ratio 06/13/2008 2303   VLDL 32 06/13/2008 2303   LDLCALC 177 (H) 11/01/2023 1033    CBC    Component Value Date/Time   WBC 8.5 11/09/2023 1441  WBC 7.8 08/29/2023 0622   RBC 4.58 11/09/2023 1441   HGB 13.4 11/09/2023 1441   HGB 12.9 11/22/2022 1633   HCT 40.2 11/09/2023 1441   HCT 38.6 11/22/2022 1633   PLT 338 11/09/2023 1441   PLT 318 11/22/2022 1633   MCV 87.8 11/09/2023 1441   MCV 89 11/22/2022 1633   MCH 29.3 11/09/2023 1441   MCHC 33.3 11/09/2023 1441   RDW 13.9 11/09/2023 1441   RDW 13.6 11/22/2022 1633   LYMPHSABS 3.2 11/09/2023 1441   LYMPHSABS 3.9 (H) 11/22/2022 1633   MONOABS 0.7 11/09/2023 1441   EOSABS 0.1 11/09/2023 1441   EOSABS 0.1 11/22/2022 1633   BASOSABS 0.0 11/09/2023 1441   BASOSABS 0.0 11/22/2022 1633      05/07/2024    3:47 PM 12/22/2023   11:09 AM 10/02/2023    2:47 PM  Depression screen PHQ 2/9  Decreased Interest 3 1 3   Down, Depressed, Hopeless 2 1 3   PHQ - 2 Score 5 2 6   Altered sleeping 3 1 3   Tired, decreased energy 3 1 3   Change in appetite 3 0 3  Feeling bad or failure about yourself  3 0 3  Trouble concentrating 3 0 3  Moving slowly or fidgety/restless 1 0 0  Suicidal thoughts 0 0 1  PHQ-9 Score 21 4 22   Difficult doing work/chores Very difficult Not difficult at all Extremely dIfficult      05/07/2024    3:48 PM 10/02/2023    2:48 PM 08/01/2023    3:53 PM 06/07/2023   10:30 AM  GAD 7 : Generalized Anxiety Score  Nervous, Anxious, on Edge 3 3 3 3   Control/stop worrying 3 3 3 3   Worry too much - different things 3 3 3 3   Trouble relaxing 3 3 3 3   Restless 1 0 1 0  Easily annoyed or irritable 3 3 3 3   Afraid - awful might happen 3 3 3 3   Total GAD 7 Score 19 18 19 18   Anxiety Difficulty Extremely difficult Extremely difficult Extremely difficult Extremely difficult   EKG: SR, LVH  with TWI in leads V5-V6 (likely reciprocal changes) and new when compared to EKG 10/2023  ASSESSMENT AND PLAN: 1. Palpitations (Primary) We will start with checking some baseline labs including thyroid  function, electrolytes and CBC.  EKG today shows normal sinus rhythm.  If thyroid  level and electrolytes are normal, we will get her back in with cardiology.  Other possibility is the overlap between Zoloft  and Prozac  as both can cause palpitations. Also in DDX is anxiety itself since pt has been under a lot of stress. Work excuse given for yesterday and today. - TSH+T4F+T3Free - Comprehensive metabolic panel with GFR - CBC - EKG 12-Lead  2. Hyperthyroidism She was taken off Tapazole  by endocrinologist end of July. Now with palpitations; will check TSH  3. Lung nodule, solitary Good news is that this has remained stable x 4 yrs. Radiologist thinks it is benign.  However, I strongly encourage smoking cessation regardless.  4. Tobacco dependence Discussed and encouraged smoking cessation.  She would like to use nicotine  replacement in the form of the patches/gum but there is concern about using in patients with history of seizures.  Recommend asking Dr. Arfeen about whether it would be okay for us  to use Chantix.  Chantix can cause mood swings.  I will also send him a message.  5. GAD (generalized anxiety disorder) Remains a significant issue for her.  Plugged in with  behavioral health  6. Prediabetes We did not get into detail about this today but will start with checking an A1c - Hemoglobin A1c  7. Severe episode of recurrent major depressive disorder, without psychotic features (HCC) See #5 above  8. Need for influenza vaccination - Flu vaccine trivalent PF, 6mos and older(Flulaval,Afluria,Fluarix,Fluzone)  HM: She is also due for shingles vaccine which she is agreeable to receiving.  However we will give just a flu shot today.  Advised that she can get Shingrix at any outside pharmacy  in 1-2 wks.   Addendum 05/08/2024: did not get to address BP on visit yesterday but sent pt Mychart message today requesting that she check BP 1-2x/wk, record readings and send me results after 3-4 readings. Refill sent on Lisinopril . Advise to continue Lisinopril , Propranolol  and Spironolactone .   Patient was given the opportunity to ask questions.  Patient verbalized understanding of the plan and was able to repeat key elements of the plan.   This documentation was completed using Paediatric nurse.  Any transcriptional errors are unintentional.  Orders Placed This Encounter  Procedures   Flu vaccine trivalent PF, 6mos and older(Flulaval,Afluria,Fluarix,Fluzone)   TSH+T4F+T3Free   Comprehensive metabolic panel with GFR   CBC   Hemoglobin A1c   EKG 12-Lead     Requested Prescriptions    No prescriptions requested or ordered in this encounter    Return in about 3 months (around 08/06/2024).  Barnie Louder, MD, FACP "

## 2024-05-07 NOTE — Telephone Encounter (Signed)
 FYI Only or Action Required?: FYI only for provider.  Patient was last seen in primary care on 10/02/2023 by Vicci Barnie NOVAK, MD.  Called Nurse Triage reporting Palpitations.  Symptoms began several days ago.  Interventions attempted: Rest, hydration, or home remedies.  Symptoms are: gradually worsening.  Triage Disposition: See HCP Within 4 Hours (Or PCP Triage)  Patient/caregiver understands and will follow disposition?: Yes      Copied from CRM [id number]. Topic: Clinical - Red Word Triage >> May 07, 2024  8:07 AM Myrick T wrote: Red Word that prompted transfer to Nurse Triage: patient said the last 2 weeks she has been having heart palpatations and her headaches feel worse than the normal migraines she has. Reason for Disposition  [1] Difficulty breathing with exertion (e.g., walking) and [2] NEW or getting WORSE  Answer Assessment - Initial Assessment Questions Last time patient had this feeling, her thyroid  out of whack. Endocrinologist did labs and took her off methimazole . She does take propranolol  BID. When asked about a-fib history, patient states PCP has sent her to Cardiologist due to having abnormal EKG.  Patient states she went to [facility] due to having shortness of breath. She was given an albuterol inhaler. This was a few months ago. Patient denies they did EKG while there. They gave breathing treatment, inhaler, and epi pen due to face swelling and neck (they believed it was allergic reaction).   Patient states she has history of high blood pressure but does not check her BP at home because she has anxiety and is scared she will have a panic attack if it's high. Patient put pulse ox while on the phone and o2 is 99% and her heart rate is 91. Her BP while on the phone is 157/93. Patient's watch states she has been giving her an alert of heartrate variability.   Patient has been having worsened headache. Patient states she takes a shot every month for migraine but she has  worsened pain that she cannot explain. Patient states she will discuss with PCP today.   1. DESCRIPTION: Please describe your heart rate or heartbeat that you are having (e.g., fast/slow, regular/irregular, skipped or extra beats, palpitations)     Patient states it feels more like a flutter 2. ONSET: When did it start? (e.g., minutes, hours, days)      About 4 days for heart palpitations 3. DURATION: How long does it last (e.g., seconds, minutes, hours)     Probably seconds, maybe a minute 4. PATTERN Does it come and go, or has it been constant since it started?  Does it get worse with exertion?   Are you feeling it now?     Patient notices it when she lays in the bed  6. HEART RATE: Can you tell me your heart rate? How many beats in 15 seconds?  Note: Not all patients can do this.       Patient states her heart rate is taken by her watch but she never takes her BP at home. Her watch shows yesterday, her last heartrate average was 80. It states her range is 50-107 and the day before it ranged as high was 131 bpm. 7. RECURRENT SYMPTOM: Have you ever had this before? If Yes, ask: When was the last time? and What happened that time?      Yes - see note on top. 8. CAUSE: What do you think is causing the palpitations?     Patient is unsure.  9. CARDIAC HISTORY:  Do you have any history of heart disease? (e.g., heart attack, angina, bypass surgery, angioplasty, arrhythmia)      Patient has been seen by Cardiologist and was cleared to come back in one year (February 2026). 10. OTHER SYMPTOMS: Do you have any other symptoms? (e.g., dizziness, chest pain, sweating, difficulty breathing)       Shortness of breath x a few months, nausea daily, patient states she sweats but believes it's due to medication/menopause. Patient states she has hot/cold flashes.  Denies chest pain.  Protocols used: Heart Rate and Heartbeat Questions-A-AH

## 2024-05-07 NOTE — Patient Instructions (Signed)
 VISIT SUMMARY:  During your visit, we discussed your recent heart palpitations, anxiety, and concerns about your thyroid  and lung nodule. We also reviewed your smoking habits, anxiety and depression management, and prediabetes concerns. Additionally, we addressed your history of seizures and general health maintenance, including vaccinations.  YOUR PLAN:  -PALPITATIONS: You have been experiencing heart palpitations, which could be due to medication changes, thyroid  issues, or electrolyte imbalances. We will check your thyroid  levels and electrolytes, perform an EKG, and consider a cardiology referral if needed.  -SOLITARY PULMONARY NODULE, RIGHT UPPER LOBE, STABLE: Your lung nodule has been stable since 2021 and is likely benign. Given your family history of lung cancer, we encourage you to quit smoking to reduce your risk.  -NICOTINE  DEPENDENCE, CURRENT: You are currently smoking nearly a pack a day and want to quit. We recommend contacting 1-800-QUIT-NOW for free patches and gum, and consulting with your psychiatrist about using Chantix, which may cause mood swings.  -GENERALIZED ANXIETY DISORDER AND DEPRESSION: Your anxiety and depression have increased with recent medication changes. You have started Rexulti  and Prozac  while tapering off Zoloft . We will coordinate with your psychiatrist for ongoing medication management.  -THYROTOXICOSIS, STATUS POST METHIMAZOLE  DISCONTINUATION: You stopped taking methimazole  at the end of July. We will check your thyroid  levels to monitor your condition.  -PREDIABETES: You are concerned about progressing to diabetes, especially with recent weight gain. We will check your blood sugar and A1c levels to monitor this.  -SEIZURE DISORDER: You have a history of seizures and are on Lamictal . We will discuss the risks of nicotine  replacement therapies with your neurologist and consult about using Chantix.  -GENERAL HEALTH MAINTENANCE: You are due for flu and  shingles vaccines. We will administer the flu vaccine today and schedule the shingles vaccine in 1-2 weeks or you can get it at an outside pharmacy.  INSTRUCTIONS:  Please follow up with the recommended lab tests and consultations. Contact 1-800-QUIT-NOW for smoking cessation support. Coordinate with your psychiatrist and neurologist regarding medication management and seizure risks. Schedule your shingles vaccine in 1-2 weeks or at an outside pharmacy.

## 2024-05-08 ENCOUNTER — Ambulatory Visit: Attending: Family Medicine

## 2024-05-08 ENCOUNTER — Ambulatory Visit: Payer: Self-pay | Admitting: Internal Medicine

## 2024-05-08 ENCOUNTER — Other Ambulatory Visit (HOSPITAL_COMMUNITY): Payer: Self-pay

## 2024-05-08 ENCOUNTER — Encounter: Payer: Self-pay | Admitting: Internal Medicine

## 2024-05-08 ENCOUNTER — Other Ambulatory Visit: Payer: Self-pay | Admitting: Internal Medicine

## 2024-05-08 DIAGNOSIS — R7303 Prediabetes: Secondary | ICD-10-CM | POA: Diagnosis not present

## 2024-05-08 DIAGNOSIS — I10 Essential (primary) hypertension: Secondary | ICD-10-CM

## 2024-05-08 DIAGNOSIS — R002 Palpitations: Secondary | ICD-10-CM | POA: Diagnosis not present

## 2024-05-08 MED ORDER — LISINOPRIL 10 MG PO TABS
10.0000 mg | ORAL_TABLET | Freq: Every day | ORAL | 1 refills | Status: DC
  Filled 2024-05-08 – 2024-06-04 (×2): qty 90, 90d supply, fill #0
  Filled 2024-08-24: qty 90, 90d supply, fill #1
  Filled 2024-09-07 (×2): qty 30, 30d supply, fill #1
  Filled 2024-10-05 – 2024-10-07 (×2): qty 30, 30d supply, fill #2

## 2024-05-09 ENCOUNTER — Telehealth (HOSPITAL_COMMUNITY): Admitting: Psychiatry

## 2024-05-09 ENCOUNTER — Other Ambulatory Visit: Payer: Self-pay | Admitting: Internal Medicine

## 2024-05-09 DIAGNOSIS — R002 Palpitations: Secondary | ICD-10-CM

## 2024-05-09 LAB — COMPREHENSIVE METABOLIC PANEL WITH GFR
ALT: 15 IU/L (ref 0–32)
AST: 20 IU/L (ref 0–40)
Albumin: 4.7 g/dL (ref 3.9–4.9)
Alkaline Phosphatase: 102 IU/L (ref 44–121)
BUN/Creatinine Ratio: 17 (ref 9–23)
BUN: 14 mg/dL (ref 6–24)
Bilirubin Total: 0.3 mg/dL (ref 0.0–1.2)
CO2: 20 mmol/L (ref 20–29)
Calcium: 10.2 mg/dL (ref 8.7–10.2)
Chloride: 102 mmol/L (ref 96–106)
Creatinine, Ser: 0.83 mg/dL (ref 0.57–1.00)
Globulin, Total: 2.6 g/dL (ref 1.5–4.5)
Glucose: 128 mg/dL — ABNORMAL HIGH (ref 70–99)
Potassium: 4.6 mmol/L (ref 3.5–5.2)
Sodium: 140 mmol/L (ref 134–144)
Total Protein: 7.3 g/dL (ref 6.0–8.5)
eGFR: 86 mL/min/1.73 (ref >59)

## 2024-05-09 LAB — CBC
Hematocrit: 39.4 % (ref 34.0–46.6)
Hemoglobin: 12.9 g/dL (ref 11.1–15.9)
MCH: 29 pg (ref 26.6–33.0)
MCHC: 32.7 g/dL (ref 31.5–35.7)
MCV: 89 fL (ref 79–97)
Platelets: 349 x10E3/uL (ref 150–450)
RBC: 4.45 x10E6/uL (ref 3.77–5.28)
RDW: 14.3 % (ref 11.7–15.4)
WBC: 9.6 x10E3/uL (ref 3.4–10.8)

## 2024-05-09 LAB — TSH+T4F+T3FREE
Free T4: 1.33 ng/dL (ref 0.82–1.77)
T3, Free: 3.7 pg/mL (ref 2.0–4.4)
TSH: 1.09 u[IU]/mL (ref 0.450–4.500)

## 2024-05-09 LAB — HEMOGLOBIN A1C
Est. average glucose Bld gHb Est-mCnc: 128 mg/dL
Hgb A1c MFr Bld: 6.1 % — ABNORMAL HIGH (ref 4.8–5.6)

## 2024-05-10 ENCOUNTER — Telehealth (HOSPITAL_COMMUNITY): Admitting: Psychiatry

## 2024-05-10 ENCOUNTER — Encounter (HOSPITAL_COMMUNITY): Payer: Self-pay | Admitting: Psychiatry

## 2024-05-10 ENCOUNTER — Other Ambulatory Visit: Payer: Self-pay

## 2024-05-10 ENCOUNTER — Telehealth (HOSPITAL_BASED_OUTPATIENT_CLINIC_OR_DEPARTMENT_OTHER): Admitting: Psychiatry

## 2024-05-10 ENCOUNTER — Other Ambulatory Visit (HOSPITAL_COMMUNITY): Payer: Self-pay

## 2024-05-10 VITALS — Wt 222.0 lb

## 2024-05-10 DIAGNOSIS — F319 Bipolar disorder, unspecified: Secondary | ICD-10-CM

## 2024-05-10 DIAGNOSIS — F5101 Primary insomnia: Secondary | ICD-10-CM | POA: Diagnosis not present

## 2024-05-10 DIAGNOSIS — F410 Panic disorder [episodic paroxysmal anxiety] without agoraphobia: Secondary | ICD-10-CM

## 2024-05-10 DIAGNOSIS — F411 Generalized anxiety disorder: Secondary | ICD-10-CM

## 2024-05-10 MED ORDER — PROPRANOLOL HCL 20 MG PO TABS
10.0000 mg | ORAL_TABLET | Freq: Two times a day (BID) | ORAL | 0 refills | Status: DC
  Filled 2024-05-10: qty 60, 60d supply, fill #0

## 2024-05-10 MED ORDER — LORAZEPAM 1 MG PO TABS
1.0000 mg | ORAL_TABLET | Freq: Three times a day (TID) | ORAL | 0 refills | Status: DC | PRN
  Filled 2024-05-10: qty 90, 30d supply, fill #0

## 2024-05-10 MED ORDER — REXULTI 0.5 MG PO TABS
0.5000 mg | ORAL_TABLET | Freq: Every day | ORAL | 0 refills | Status: DC
  Filled 2024-05-10: qty 30, 30d supply, fill #0

## 2024-05-10 MED ORDER — FLUOXETINE HCL 20 MG PO CAPS
20.0000 mg | ORAL_CAPSULE | Freq: Every day | ORAL | 0 refills | Status: DC
  Filled 2024-05-10: qty 30, 30d supply, fill #0

## 2024-05-10 NOTE — Progress Notes (Signed)
  Health MD Virtual Progress Note   Patient Location: Home Provider Location: Home Office  I connect with patient by video and verified that I am speaking with correct person by using two identifiers. I discussed the limitations of evaluation and management by telemedicine and the availability of in person appointments. I also discussed with the patient that there may be a patient responsible charge related to this service. The patient expressed understanding and agreed to proceed.  Sandra Miller [id number] 48 y.o.  05/10/2024 8:43 AM  History of Present Illness:  Patient is evaluated by video session.  She is taking Prozac  20 mg and Rexulti  0.5 mg at bedtime.  She reported lately noticed having increased heart rate, sweaty and calming hand.  She is not sure what is the reason and she noticed not sleeping very well.  She switch Prozac  and not taking in the morning and Rexulti  at bedtime.  She is sleeping 4 hours and wake up 2:00 in the morning.  Her work starts 5 AM in the morning.  She feels the medicine is helping her mood otherwise.  She feels more energetic more hopeful and denies any mania, anger, mood swings.  Today she is excited as having an interview at [employer]  She is applying other places for her job.  She took last Zoloft  dose yesterday.  She is taking Ativan  1 mg up to 3 times a day which helps her anxiety.  She denies any hallucination, paranoia or any anger or impulsive behavior.  She reported some stress but mostly about her physical condition.  Her panic attacks are not as intense.  She admitted drinking caffeine.  She also drinks soda but reported no sugar.  She has no tremor or shakes or any EPS.  I reviewed the notes from primary care and recent blood work results.  Her PCP also did EKG and now recommending to see cardiology due to increased heart rate and blood pressure.  Patient is not sure why she is having high blood pressure episodes.  She denies  drinking or using any illegal substances.  She had a good support from her family.  Past Psychiatric History: H/O bipolar disorder, alcohol dependence and marijuana abuse.  H/O at least 6 inpatient. Tried lithium, Seroquel, Lamictal , Paxil, Celexa, Abilify, Geodon , Klonopin, Depakote, Neurontin  and Cymbalta.  Trintellix  caused nausea. No H/o history of suicidal attempt.  Olanzapine  helped but it was discontinued in July 2024 due to abnormal EKG. Prozac  helped for while until stopped working.  Topamax  stopped due to inefficiency.   Past Medical History:  Diagnosis Date   Allergy    Arthritis    Bipolar 1 disorder (HCC)    Cancer (HCC)    vulva, and breast   Chronic kidney disease    Clotting disorder (HCC)    Complication of anesthesia    wakes up during procedures   Depression    GAD (generalized anxiety disorder)    Genital HSV    currently per pt  no break out 03-22-2016    GERD (gastroesophageal reflux disease)    Graves disease    Hiatal hernia    History of cervical dysplasia    2012 laser ablation   History of esophageal dilatation    for dysphasia -- x2 dilated   History of gastric ulcer    History of Helicobacter pylori infection    remote hx   History of hidradenitis suppurativa    gets all over body intermittantly  History of hypertension    no issue since stopped drinking alcohol 2014   History of kidney stones    History of panic attacks    History of radiation therapy 03/16/2020-05/08/2020   vulva  Dr Lynwood Nasuti   History of radiation therapy 09/09/2020-10/23/2020   left chest wall/left SCV   Dr Lynwood Nasuti   Hyperlipidemia    Hypertension    Iron deficiency anemia    Left ureteral stone    OCD (obsessive compulsive disorder)    PONV (postoperative nausea and vomiting)    Pre-diabetes    PTSD (post-traumatic stress disorder)    Recovering alcoholic in remission (HCC)    since 2014   RLS (restless legs syndrome)    Seizures (HCC)    Smokers' cough  (HCC)    Thyroid  disease    Urgency of urination    Yeast infection involving the vagina and surrounding area    secondary to taking antibiotic    Outpatient Encounter Medications as of 05/10/2024  Medication Sig   acetaminophen  (TYLENOL ) 500 MG tablet Take 500 mg by mouth every 6 (six) hours as needed.   albuterol (VENTOLIN HFA) 108 (90 Base) MCG/ACT inhaler Inhale 2 puffs into the lungs every 6 (six) hours as needed for wheezing or shortness of breath.   Brexpiprazole  (REXULTI ) 0.5 MG TABS Take 1 tablet (0.5 mg total) by mouth daily.   famotidine  (PEPCID ) 40 MG tablet Take 1 tablet (40 mg total) by mouth daily.   FLUoxetine  (PROZAC ) 10 MG capsule Take one capsule daily for one week and than two capsule daily   Fremanezumab -vfrm (AJOVY ) 225 MG/1.5ML SOAJ Inject 225 mg into the skin every 30 (thirty) days.   gabapentin  (NEURONTIN ) 300 MG capsule Take 1 capsule (300 mg) by mouth daily.   LamoTRIgine  (LAMICTAL  XR) 200 MG TB24 24 hour tablet Take 1 tablet (200 mg total) by mouth at bedtime.   letrozole  (FEMARA ) 2.5 MG tablet Take 1 tablet by mouth daily.   lisinopril  (ZESTRIL ) 10 MG tablet Take 1 tablet (10 mg total) by mouth daily.   loratadine (CLARITIN REDITABS) 10 MG dissolvable tablet Take 10 mg by mouth daily. OTC   LORazepam  (ATIVAN ) 1 MG tablet Take 1 tablet (1 mg total) by mouth 3 (three) times daily as needed for anxiety   Magnesium  400 MG TABS Take 800 mg by mouth 2 (two) times daily.   Multiple Vitamin (MULTIVITAMIN WITH MINERALS) TABS tablet Take 1 tablet by mouth daily.   omeprazole  (PRILOSEC) 40 MG capsule Take 1 capsule (40 mg total) by mouth daily before breakfast.   potassium chloride  SA (KLOR-CON  M) 20 MEQ tablet Take 1 tablet (20 mEq total) by mouth daily. Must have office visit for refills   promethazine  (PHENERGAN ) 12.5 MG tablet Take 1 tablet (12.5 mg total) by mouth every 8 (eight) hours as needed for nausea or vomiting.   propranolol  (INDERAL ) 10 MG tablet Take 1 tablet  by mouth 2 times daily.   sertraline  (ZOLOFT ) 25 MG tablet Take 1 tablet (25 mg total) by mouth daily. For two weeks and than discontinue (Patient not taking: Reported on 05/07/2024)   spironolactone  (ALDACTONE ) 25 MG tablet Take 0.5 tablets (12.5 mg total) by mouth daily.   Ubrogepant  (UBRELVY ) 50 MG TABS Take 50 mg as needed for migraine, a second dose may be taken at least 2 hours after the initial dose. DO NOT EXCEED 2 doses in 24 hours.   No facility-administered encounter medications on file as of 05/10/2024.  Recent Results (from the past 2160 hours)  TSH+T4F+T3Free     Status: None   Collection Time: 05/08/24  3:40 PM  Result Value Ref Range   TSH 1.090 0.450 - 4.500 uIU/mL   T3, Free 3.7 2.0 - 4.4 pg/mL   Free T4 1.33 0.82 - 1.77 ng/dL  Comprehensive metabolic panel with GFR     Status: Abnormal   Collection Time: 05/08/24  3:40 PM  Result Value Ref Range   Glucose 128 (H) 70 - 99 mg/dL   BUN 14 6 - 24 mg/dL   Creatinine, Ser 9.16 0.57 - 1.00 mg/dL   eGFR 86 >40 fO/fpw/8.26   BUN/Creatinine Ratio 17 9 - 23   Sodium 140 134 - 144 mmol/L   Potassium 4.6 3.5 - 5.2 mmol/L   Chloride 102 96 - 106 mmol/L   CO2 20 20 - 29 mmol/L   Calcium  10.2 8.7 - 10.2 mg/dL   Total Protein 7.3 6.0 - 8.5 g/dL   Albumin 4.7 3.9 - 4.9 g/dL   Globulin, Total 2.6 1.5 - 4.5 g/dL   Bilirubin Total 0.3 0.0 - 1.2 mg/dL   Alkaline Phosphatase 102 44 - 121 IU/L    Comment: **Effective May 20, 2024 Alkaline Phosphatase**   reference interval will be changing to:              Age                Female          Female           0 -  5 days         47 - 127       47 - 127           6 - 10 days         29 - 242       29 - 242          11 - 20 days        109 - 357      109 - 357          21 - 30 days         94 - 494       94 - 494           1 -  2 months      149 - 539      149 - 539           3 -  6 months      131 - 452      131 - 452           7 - 11 months      117 - 401      117 - 401   12  months -  6 years       158 - 369      158 - 369           7 - 12 years       150 - 409      150 - 409               13 years       156 - 435       78 - 227               14 years       114 -  375       64 - 161               15 years        88 - 279       56 - 134               16 years        74 - 207       51 - 121               17 years        63 - 161       47 - 113          18 - 20 years        51 - 125       42 - 106          21 - 50 years         47 - 123       41 - 116          51 - 80 years        49 - 135       51 - 125              >80 years        48 - 129       48 - 129    AST 20 0 - 40 IU/L   ALT 15 0 - 32 IU/L  CBC     Status: None   Collection Time: 05/08/24  3:40 PM  Result Value Ref Range   WBC 9.6 3.4 - 10.8 x10E3/uL   RBC 4.45 3.77 - 5.28 x10E6/uL   Hemoglobin 12.9 11.1 - 15.9 g/dL   Hematocrit 60.5 65.9 - 46.6 %   MCV 89 79 - 97 fL   MCH 29.0 26.6 - 33.0 pg   MCHC 32.7 31.5 - 35.7 g/dL   RDW 85.6 88.2 - 84.5 %   Platelets 349 150 - 450 x10E3/uL  Hemoglobin A1c     Status: Abnormal   Collection Time: 05/08/24  3:40 PM  Result Value Ref Range   Hgb A1c MFr Bld 6.1 (H) 4.8 - 5.6 %    Comment:          Prediabetes: 5.7 - 6.4          Diabetes: >6.4          Glycemic control for adults with diabetes: <7.0    Est. average glucose Bld gHb Est-mCnc 128 mg/dL     Psychiatric Specialty Exam: Physical Exam  Review of Systems  Cardiovascular:  Positive for palpitations.  Psychiatric/Behavioral:  Positive for sleep disturbance. The patient is nervous/anxious.     Weight 222 lb (100.7 kg).Body mass index is 35.83 kg/m.  General Appearance: Casual  Eye Contact:  Good  Speech:  Clear and Coherent and Normal Rate  Volume:  Normal  Mood:  Anxious  Affect:  Congruent  Thought Process:  Goal Directed  Orientation:  Full (Time, Place, and Person)  Thought Content:  Rumination  Suicidal Thoughts:  No  Homicidal Thoughts:  No  Memory:  Immediate;   Good Recent;    Good Remote;   Good  Judgement:  Intact  Insight:  Present  Psychomotor Activity:  Normal  Concentration:  Concentration: Good and Attention Span: Good  Recall:  Good  Fund of Knowledge:  Good  Language:  Good  Akathisia:  No  Handed:  Right  AIMS (if indicated):     Assets:  Communication Skills Desire for Improvement Housing Resilience Social Support Talents/Skills Transportation  ADL's:  Intact  Cognition:  WNL  Sleep:  fair       05/07/2024    3:47 PM 12/22/2023   11:09 AM 10/02/2023    2:47 PM 09/25/2023    9:52 AM 08/01/2023    3:53 PM  Depression screen PHQ 2/9  Decreased Interest 3 1 3 3 3   Down, Depressed, Hopeless 2 1 3 3 3   PHQ - 2 Score 5 2 6 6 6   Altered sleeping 3 1 3 3 3   Tired, decreased energy 3 1 3 3 3   Change in appetite 3 0 3 3 3   Feeling bad or failure about yourself  3 0 3 3 3   Trouble concentrating 3 0 3 3 3   Moving slowly or fidgety/restless 1 0 0 3 0  Suicidal thoughts 0 0 1 3 0  PHQ-9 Score 21 4 22 27 21   Difficult doing work/chores Very difficult Not difficult at all Extremely dIfficult Extremely dIfficult Extremely dIfficult    Assessment/Plan: Bipolar 1 disorder (HCC) - Plan: Brexpiprazole  (REXULTI ) 0.5 MG TABS, FLUoxetine  (PROZAC ) 20 MG capsule, propranolol  (INDERAL ) 20 MG tablet  GAD (generalized anxiety disorder) - Plan: FLUoxetine  (PROZAC ) 20 MG capsule, LORazepam  (ATIVAN ) 1 MG tablet, propranolol  (INDERAL ) 20 MG tablet  Primary insomnia - Plan: FLUoxetine  (PROZAC ) 20 MG capsule, LORazepam  (ATIVAN ) 1 MG tablet  Panic attacks - Plan: propranolol  (INDERAL ) 20 MG tablet  Patient is 48 year old female with history of hypertension, chronic migraine, GERD, seizures, Graves' disease, prolonged QT interval, hypothyroidism, generalized anxiety disorder, panic attacks, bipolar disorder and insomnia.  Reviewed blood work results and notes from primary care.  She is having symptoms of palpitation, sweating and calming hands.  Patient used to be  on methimazole  but it was discontinued.  She is on propranolol  10 mg twice a day.  Her PCP is going to refer to cardiology.  Patient does not want to change the Prozac  and Rexulti  because she feel it is working and helping her mood and anger.  Discussed stopping caffeine and cut down the soda because of increased heart rate.  She is in a cross titration of Zoloft  and her last Zoloft  was yesterday.  She overall liked the Prozac  and Rexulti .  Discussed sleep hygiene.  Recommend to try propranolol  10 mg twice a day to 20 mg twice a day.  It is prescribed by PCP but patient like to have one-time refill from our office until she see cardiology or primary care.  I recommend it will help the anxiety, palpitation and blood pressure.  She need to monitor closely her vitals especially postural hypotension with propranolol .  I recommend to call back if she has any question or worsening of symptoms.  Follow-up in 4 weeks.  I will also forward my note to her PCP.     Follow Up Instructions:     I discussed the assessment and treatment plan with the patient. The patient was provided an opportunity to ask questions and all were answered. The patient agreed with the plan and demonstrated an understanding of the instructions.   The patient was advised to call back or seek an in-person evaluation if the symptoms worsen or if the condition fails to improve as anticipated.    Collaboration of Care: Other provider involved in patient's care AEB notes are available in epic to review  Patient/Guardian was advised Release of Information must be obtained prior to any record release in order to collaborate their care with an outside provider. Patient/Guardian was advised if they have not already done so to contact the registration department to sign all necessary forms in order for us  to release information regarding their care.   Consent: Patient/Guardian gives verbal consent for treatment and assignment of benefits for  services provided during this visit. Patient/Guardian expressed understanding and agreed to proceed.     Total encounter time 31 minutes which includes face-to-face time, chart reviewed, care coordination, order entry and documentation during this encounter.   Note: This document was prepared by [employer] voice dictation technology and any errors that results from this process are unintentional.    Leni ONEIDA Client, MD 05/10/2024

## 2024-05-11 ENCOUNTER — Encounter: Payer: Self-pay | Admitting: Hematology and Oncology

## 2024-05-11 ENCOUNTER — Other Ambulatory Visit (HOSPITAL_COMMUNITY): Payer: Self-pay

## 2024-05-14 ENCOUNTER — Other Ambulatory Visit: Payer: Self-pay

## 2024-05-14 ENCOUNTER — Encounter: Payer: Self-pay | Admitting: Hematology and Oncology

## 2024-05-24 ENCOUNTER — Ambulatory Visit: Admitting: Internal Medicine

## 2024-05-24 ENCOUNTER — Other Ambulatory Visit: Payer: Self-pay | Admitting: Physician Assistant

## 2024-05-24 ENCOUNTER — Other Ambulatory Visit: Payer: Self-pay | Admitting: Internal Medicine

## 2024-05-24 ENCOUNTER — Other Ambulatory Visit (HOSPITAL_COMMUNITY): Payer: Self-pay

## 2024-05-24 ENCOUNTER — Other Ambulatory Visit: Payer: Self-pay

## 2024-05-24 ENCOUNTER — Encounter: Payer: Self-pay | Admitting: Hematology and Oncology

## 2024-05-24 DIAGNOSIS — R1013 Epigastric pain: Secondary | ICD-10-CM

## 2024-05-24 DIAGNOSIS — I10 Essential (primary) hypertension: Secondary | ICD-10-CM

## 2024-05-24 DIAGNOSIS — E876 Hypokalemia: Secondary | ICD-10-CM

## 2024-05-24 MED ORDER — POTASSIUM CHLORIDE CRYS ER 20 MEQ PO TBCR
20.0000 meq | EXTENDED_RELEASE_TABLET | Freq: Every day | ORAL | 1 refills | Status: DC
  Filled 2024-05-24: qty 90, 90d supply, fill #0
  Filled 2024-08-24 – 2024-09-12 (×3): qty 90, 90d supply, fill #1
  Filled 2024-09-12: qty 30, 30d supply, fill #1
  Filled 2024-10-07: qty 30, 30d supply, fill #2

## 2024-05-24 MED ORDER — PROMETHAZINE HCL 12.5 MG PO TABS
12.5000 mg | ORAL_TABLET | Freq: Three times a day (TID) | ORAL | 0 refills | Status: DC | PRN
  Filled 2024-05-24: qty 30, 10d supply, fill #0

## 2024-05-24 MED ORDER — FAMOTIDINE 40 MG PO TABS
40.0000 mg | ORAL_TABLET | Freq: Every day | ORAL | 3 refills | Status: AC
  Filled 2024-05-24: qty 90, 90d supply, fill #0
  Filled 2024-08-24: qty 90, 90d supply, fill #1
  Filled 2024-09-07: qty 30, 30d supply, fill #1
  Filled 2024-09-09: qty 90, 90d supply, fill #1

## 2024-05-24 MED ORDER — SPIRONOLACTONE 25 MG PO TABS
12.5000 mg | ORAL_TABLET | Freq: Every day | ORAL | 1 refills | Status: AC
  Filled 2024-05-24 – 2024-06-10 (×3): qty 45, 90d supply, fill #0
  Filled 2024-08-24: qty 45, 90d supply, fill #1
  Filled 2024-09-07: qty 15, 30d supply, fill #1
  Filled 2024-09-12 (×2): qty 45, 90d supply, fill #1

## 2024-05-24 MED ORDER — GABAPENTIN 300 MG PO CAPS
300.0000 mg | ORAL_CAPSULE | Freq: Every day | ORAL | 1 refills | Status: DC
  Filled 2024-05-24 – 2024-05-29 (×2): qty 30, 30d supply, fill #0
  Filled 2024-06-29: qty 30, 30d supply, fill #1

## 2024-05-27 ENCOUNTER — Other Ambulatory Visit: Payer: Self-pay

## 2024-05-27 ENCOUNTER — Other Ambulatory Visit (HOSPITAL_COMMUNITY): Payer: Self-pay

## 2024-05-28 ENCOUNTER — Other Ambulatory Visit (HOSPITAL_COMMUNITY): Payer: Self-pay

## 2024-05-29 ENCOUNTER — Other Ambulatory Visit (HOSPITAL_COMMUNITY): Payer: Self-pay

## 2024-05-29 ENCOUNTER — Other Ambulatory Visit: Payer: Self-pay

## 2024-06-04 ENCOUNTER — Other Ambulatory Visit (HOSPITAL_COMMUNITY): Payer: Self-pay

## 2024-06-04 ENCOUNTER — Other Ambulatory Visit: Payer: Self-pay

## 2024-06-06 ENCOUNTER — Other Ambulatory Visit: Payer: Self-pay | Admitting: Physician Assistant

## 2024-06-06 ENCOUNTER — Other Ambulatory Visit (HOSPITAL_COMMUNITY): Payer: Self-pay

## 2024-06-06 ENCOUNTER — Telehealth (HOSPITAL_BASED_OUTPATIENT_CLINIC_OR_DEPARTMENT_OTHER): Admitting: Psychiatry

## 2024-06-06 ENCOUNTER — Encounter (HOSPITAL_COMMUNITY): Payer: Self-pay | Admitting: Psychiatry

## 2024-06-06 ENCOUNTER — Other Ambulatory Visit: Payer: Self-pay

## 2024-06-06 VITALS — Wt 226.0 lb

## 2024-06-06 DIAGNOSIS — R1013 Epigastric pain: Secondary | ICD-10-CM

## 2024-06-06 DIAGNOSIS — F411 Generalized anxiety disorder: Secondary | ICD-10-CM | POA: Diagnosis not present

## 2024-06-06 DIAGNOSIS — F410 Panic disorder [episodic paroxysmal anxiety] without agoraphobia: Secondary | ICD-10-CM

## 2024-06-06 DIAGNOSIS — F319 Bipolar disorder, unspecified: Secondary | ICD-10-CM

## 2024-06-06 DIAGNOSIS — F5101 Primary insomnia: Secondary | ICD-10-CM | POA: Diagnosis not present

## 2024-06-06 MED ORDER — PROPRANOLOL HCL 20 MG PO TABS
20.0000 mg | ORAL_TABLET | Freq: Two times a day (BID) | ORAL | 1 refills | Status: DC
  Filled 2024-06-06 – 2024-07-06 (×2): qty 60, 30d supply, fill #0

## 2024-06-06 MED ORDER — FLUOXETINE HCL 20 MG PO CAPS
20.0000 mg | ORAL_CAPSULE | Freq: Every day | ORAL | 1 refills | Status: DC
  Filled 2024-06-06: qty 30, 30d supply, fill #0
  Filled 2024-07-04: qty 30, 30d supply, fill #1

## 2024-06-06 MED ORDER — REXULTI 0.5 MG PO TABS
0.5000 mg | ORAL_TABLET | Freq: Every day | ORAL | 1 refills | Status: DC
  Filled 2024-06-06: qty 30, 30d supply, fill #0
  Filled 2024-07-06: qty 30, 30d supply, fill #1

## 2024-06-06 MED ORDER — LORAZEPAM 1 MG PO TABS
1.0000 mg | ORAL_TABLET | Freq: Three times a day (TID) | ORAL | 1 refills | Status: DC | PRN
  Filled 2024-06-06 – 2024-06-07 (×2): qty 90, 30d supply, fill #0
  Filled 2024-07-15: qty 90, 30d supply, fill #1

## 2024-06-06 NOTE — Progress Notes (Signed)
 [facility] MD Virtual Progress Note   Patient Location: In car Provider Location: Office  I connect with patient by video and verified that I am speaking with correct person by using two identifiers. I discussed the limitations of evaluation and management by telemedicine and the availability of in person appointments. I also discussed with the patient that there may be a patient responsible charge related to this service. The patient expressed understanding and agreed to proceed.  Sandra Miller [id number] 48 y.o.  06/06/2024 8:46 AM  History of Present Illness:  Patient is evaluated by video session.  Today she reported feeling not very well and having diarrhea, tired, feels sick to her stomach and may have a fever.  She continues to have full logical symptoms of anxiety which she describes sweating, calming hand increase heart rate and palpitation.  She is waiting to have appointment with cardiology as PCP referred but so far have not heard.  We recommend to increase propranolol  but she was not able to pick up the prescription of the propranolol .  She struggled with sleep and having racing thoughts.  She feels sad, dysphoric, disappointment because her physical symptoms and she is not happy with her current job.  She is still waiting from other places as has multiple interviews but so far no confirmation of the job.  She is taking Ativan  1 mg 3 times a day.  She denies any hallucination, paranoia, suicidal thoughts or homicidal thoughts.  She admitted few pounds weight gain.  She is not sleeping very well and now going to try Unisom which she had tried in the past and worked well.  She is concerned about her high blood pressure as continues to have high reading as checking at least 2 times a week.  She had a good support from family.  She is taking all her medication except propranolol  which is 10 mg twice a day instead of 20 mg twice a day.  She denies any aggression, violence,  impulsive behavior.  Past Psychiatric History: H/O bipolar disorder, alcohol dependence and marijuana abuse.  H/O at least 6 inpatient. Tried lithium, Seroquel, Lamictal , Paxil, Celexa, Abilify, Geodon , Klonopin, Depakote, Neurontin  and Cymbalta.  Trintellix  caused nausea. No H/o history of suicidal attempt.  Olanzapine  helped but it was discontinued in July 2024 due to abnormal EKG. Prozac  helped for while until stopped working.  Topamax  stopped due to inefficiency.   Past Medical History:  Diagnosis Date   Allergy    Arthritis    Bipolar 1 disorder (HCC)    Cancer (HCC)    vulva, and breast   Chronic kidney disease    Clotting disorder    Complication of anesthesia    wakes up during procedures   Depression    GAD (generalized anxiety disorder)    Genital HSV    currently per pt  no break out 03-22-2016    GERD (gastroesophageal reflux disease)    Graves disease    Hiatal hernia    History of cervical dysplasia    2012 laser ablation   History of esophageal dilatation    for dysphasia -- x2 dilated   History of gastric ulcer    History of Helicobacter pylori infection    remote hx   History of hidradenitis suppurativa    gets all over body intermittantly     History of hypertension    no issue since stopped drinking alcohol 2014   History of kidney stones    History of  panic attacks    History of radiation therapy 03/16/2020-05/08/2020   vulva  Dr Lynwood Nasuti   History of radiation therapy 09/09/2020-10/23/2020   left chest wall/left SCV   Dr Lynwood Nasuti   Hyperlipidemia    Hypertension    Iron deficiency anemia    Left ureteral stone    OCD (obsessive compulsive disorder)    PONV (postoperative nausea and vomiting)    Pre-diabetes    PTSD (post-traumatic stress disorder)    Recovering alcoholic in remission (HCC)    since 2014   RLS (restless legs syndrome)    Seizures (HCC)    Smokers' cough (HCC)    Thyroid  disease    Urgency of urination    Yeast infection  involving the vagina and surrounding area    secondary to taking antibiotic    Outpatient Encounter Medications as of 06/06/2024  Medication Sig   acetaminophen  (TYLENOL ) 500 MG tablet Take 500 mg by mouth every 6 (six) hours as needed.   albuterol (VENTOLIN HFA) 108 (90 Base) MCG/ACT inhaler Inhale 2 puffs into the lungs every 6 (six) hours as needed for wheezing or shortness of breath.   Brexpiprazole  (REXULTI ) 0.5 MG TABS Take 1 tablet (0.5 mg total) by mouth daily.   famotidine  (PEPCID ) 40 MG tablet Take 1 tablet (40 mg total) by mouth daily.   FLUoxetine  (PROZAC ) 20 MG capsule Take 1 capsule (20 mg total) by mouth daily.   Fremanezumab -vfrm (AJOVY ) 225 MG/1.5ML SOAJ Inject 225 mg into the skin every 30 (thirty) days.   gabapentin  (NEURONTIN ) 300 MG capsule Take 1 capsule (300 mg) by mouth daily.   LamoTRIgine  (LAMICTAL  XR) 200 MG TB24 24 hour tablet Take 1 tablet (200 mg total) by mouth at bedtime.   letrozole  (FEMARA ) 2.5 MG tablet Take 1 tablet by mouth daily.   lisinopril  (ZESTRIL ) 10 MG tablet Take 1 tablet (10 mg total) by mouth daily.   loratadine (CLARITIN REDITABS) 10 MG dissolvable tablet Take 10 mg by mouth daily. OTC   LORazepam  (ATIVAN ) 1 MG tablet Take 1 tablet (1 mg total) by mouth 3 (three) times daily as needed for anxiety   Magnesium  400 MG TABS Take 800 mg by mouth 2 (two) times daily.   Multiple Vitamin (MULTIVITAMIN WITH MINERALS) TABS tablet Take 1 tablet by mouth daily.   omeprazole  (PRILOSEC) 40 MG capsule Take 1 capsule (40 mg total) by mouth daily before breakfast.   potassium chloride  SA (KLOR-CON  M) 20 MEQ tablet Take 1 tablet (20 mEq total) by mouth daily.   promethazine  (PHENERGAN ) 12.5 MG tablet Take 1 tablet (12.5 mg total) by mouth every 8 (eight) hours as needed for nausea or vomiting.   propranolol  (INDERAL ) 20 MG tablet Take 0.5 tablets (10 mg total) by mouth 2 (two) times daily.   spironolactone  (ALDACTONE ) 25 MG tablet Take 0.5 tablets (12.5 mg total)  by mouth daily.   Ubrogepant  (UBRELVY ) 50 MG TABS Take 50 mg as needed for migraine, a second dose may be taken at least 2 hours after the initial dose. DO NOT EXCEED 2 doses in 24 hours.   No facility-administered encounter medications on file as of 06/06/2024.    Recent Results (from the past 2160 hours)  TSH+T4F+T3Free     Status: None   Collection Time: 05/08/24  3:40 PM  Result Value Ref Range   TSH 1.090 0.450 - 4.500 uIU/mL   T3, Free 3.7 2.0 - 4.4 pg/mL   Free T4 1.33 0.82 - 1.77 ng/dL  Comprehensive metabolic panel with GFR     Status: Abnormal   Collection Time: 05/08/24  3:40 PM  Result Value Ref Range   Glucose 128 (H) 70 - 99 mg/dL   BUN 14 6 - 24 mg/dL   Creatinine, Ser 9.16 0.57 - 1.00 mg/dL   eGFR 86 >40 fO/fpw/8.26   BUN/Creatinine Ratio 17 9 - 23   Sodium 140 134 - 144 mmol/L   Potassium 4.6 3.5 - 5.2 mmol/L   Chloride 102 96 - 106 mmol/L   CO2 20 20 - 29 mmol/L   Calcium  10.2 8.7 - 10.2 mg/dL   Total Protein 7.3 6.0 - 8.5 g/dL   Albumin 4.7 3.9 - 4.9 g/dL   Globulin, Total 2.6 1.5 - 4.5 g/dL   Bilirubin Total 0.3 0.0 - 1.2 mg/dL   Alkaline Phosphatase 102 44 - 121 IU/L    Comment: **Effective May 20, 2024 Alkaline Phosphatase**   reference interval will be changing to:              Age                Female          Female           0 -  5 days         47 - 127       47 - 127           6 - 10 days         29 - 242       29 - 242          11 - 20 days        109 - 357      109 - 357          21 - 30 days         94 - 494       94 - 494           1 -  2 months      149 - 539      149 - 539           3 -  6 months      131 - 452      131 - 452           7 - 11 months      117 - 401      117 - 401   12 months -  6 years       158 - 369      158 - 369           7 - 12 years       150 - 409      150 - 409               13 years       156 - 435       78 - 227               14 years       114 - 375       64 - 161               15 years        88 - 279        56 - 134  16 years        74 - 207       51 - 121               17 years        63 - 161       47 - 113          18 - 20 years        51 - 125       42 - 106          21 - 50 years         47 - 123       41 - 116          51 - 80 years        49 - 135       51 - 125              >80 years        48 - 129       48 - 129    AST 20 0 - 40 IU/L   ALT 15 0 - 32 IU/L  CBC     Status: None   Collection Time: 05/08/24  3:40 PM  Result Value Ref Range   WBC 9.6 3.4 - 10.8 x10E3/uL   RBC 4.45 3.77 - 5.28 x10E6/uL   Hemoglobin 12.9 11.1 - 15.9 g/dL   Hematocrit 60.5 65.9 - 46.6 %   MCV 89 79 - 97 fL   MCH 29.0 26.6 - 33.0 pg   MCHC 32.7 31.5 - 35.7 g/dL   RDW 85.6 88.2 - 84.5 %   Platelets 349 150 - 450 x10E3/uL  Hemoglobin A1c     Status: Abnormal   Collection Time: 05/08/24  3:40 PM  Result Value Ref Range   Hgb A1c MFr Bld 6.1 (H) 4.8 - 5.6 %    Comment:          Prediabetes: 5.7 - 6.4          Diabetes: >6.4          Glycemic control for adults with diabetes: <7.0    Est. average glucose Bld gHb Est-mCnc 128 mg/dL     Psychiatric Specialty Exam: Physical Exam  Review of Systems  Constitutional:  Positive for fatigue.  Cardiovascular:  Positive for palpitations.  Gastrointestinal:  Positive for diarrhea.  Psychiatric/Behavioral:  Positive for dysphoric mood and sleep disturbance. The patient is nervous/anxious.     Weight 226 lb (102.5 kg).There is no height or weight on file to calculate BMI.  General Appearance: Casual  Eye Contact:  Fair  Speech:  Slow  Volume:  Normal  Mood:  Dysphoric  Affect:  Congruent  Thought Process:  Descriptions of Associations: Intact  Orientation:  Full (Time, Place, and Person)  Thought Content:  Rumination  Suicidal Thoughts:  No  Homicidal Thoughts:  No  Memory:  Immediate;   Good Recent;   Good Remote;   Good  Judgement:  Intact  Insight:  Present  Psychomotor Activity:  Decreased  Concentration:  Concentration: Fair  and Attention Span: Good  Recall:  Good  Fund of Knowledge:  Good  Language:  Good  Akathisia:  No  Handed:  Right  AIMS (if indicated):     Assets:  Communication Skills Desire for Improvement Housing Resilience Social Support Talents/Skills Transportation  ADL's:  Intact  Cognition:  WNL  Sleep:  fair  05/07/2024    3:47 PM 12/22/2023   11:09 AM 10/02/2023    2:47 PM 09/25/2023    9:52 AM 08/01/2023    3:53 PM  Depression screen PHQ 2/9  Decreased Interest 3 1 3 3 3   Down, Depressed, Hopeless 2 1 3 3 3   PHQ - 2 Score 5 2 6 6 6   Altered sleeping 3 1 3 3 3   Tired, decreased energy 3 1 3 3 3   Change in appetite 3 0 3 3 3   Feeling bad or failure about yourself  3 0 3 3 3   Trouble concentrating 3 0 3 3 3   Moving slowly or fidgety/restless 1 0 0 3 0  Suicidal thoughts 0 0 1 3 0  PHQ-9 Score 21 4 22 27 21   Difficult doing work/chores Very difficult Not difficult at all Extremely dIfficult Extremely dIfficult Extremely dIfficult    Assessment/Plan: GAD (generalized anxiety disorder) - Plan: propranolol  (INDERAL ) 20 MG tablet, LORazepam  (ATIVAN ) 1 MG tablet, FLUoxetine  (PROZAC ) 20 MG capsule  Bipolar 1 disorder (HCC) - Plan: propranolol  (INDERAL ) 20 MG tablet, Brexpiprazole  (REXULTI ) 0.5 MG TABS, FLUoxetine  (PROZAC ) 20 MG capsule  Panic attacks - Plan: propranolol  (INDERAL ) 20 MG tablet  Primary insomnia - Plan: LORazepam  (ATIVAN ) 1 MG tablet, FLUoxetine  (PROZAC ) 20 MG capsule  Patient is 48 year old female with history of hypertension, GERD, seizures, Graves' disease, prolonged QT interval, chronic migraine, hypothyroidism, bipolar disorder, panic attacks, insomnia and generalized anxiety disorder.  She continues to have his logical symptoms of increased anxiety as she described palpitation, sweating.  Reinforced to take the propranolol  20 mg twice a day to help her physiological symptoms of anxiety and also helps the blood pressure.  Today she is not feeling very well and  having diarrhea, feverish and tired.  She is frustrated with her job as going early in the morning and job is challenging.  She is hoping to find another job as she had applied multiple places.  Given the history of prolonged QTc interval we will not change any medication as patient feel Prozac  and Rexulti  combination working and doing very well.  I encouraged to try propranolol  20 mg twice a day and keep the Ativan  1 mg 3 times a day, Rexulti  0.5 mg daily and Prozac  20 mg daily.  She need to monitor her vitals closely as higher dose of propranolol  can cause postural hypotension.  I will also send a message to her PCP as patient is wondering about her cardiology referral.  Recommend to call back if has any question or any concern.  Follow-up in 6 weeks.  Patient is hoping to to get some sleep with over-the-counter Unisom which had worked in the past.  I also encouraged to contact PCP if diarrhea and fever does not get better.   Follow Up Instructions:     I discussed the assessment and treatment plan with the patient. The patient was provided an opportunity to ask questions and all were answered. The patient agreed with the plan and demonstrated an understanding of the instructions.   The patient was advised to call back or seek an in-person evaluation if the symptoms worsen or if the condition fails to improve as anticipated.    Collaboration of Care: Other provider involved in patient's care AEB notes are available in epic to review  Patient/Guardian was advised Release of Information must be obtained prior to any record release in order to collaborate their care with an outside provider. Patient/Guardian was advised if they have  not already done so to contact the registration department to sign all necessary forms in order for us  to release information regarding their care.   Consent: Patient/Guardian gives verbal consent for treatment and assignment of benefits for services provided during this  visit. Patient/Guardian expressed understanding and agreed to proceed.     Total encounter time 28 minutes which includes face-to-face time, chart reviewed, care coordination, order entry and documentation during this encounter.   Note: This document was prepared by [employer] voice dictation technology and any errors that results from this process are unintentional.    Sandra Miller Client, MD 06/06/2024

## 2024-06-07 ENCOUNTER — Other Ambulatory Visit (HOSPITAL_COMMUNITY): Payer: Self-pay

## 2024-06-07 ENCOUNTER — Encounter: Payer: Self-pay | Admitting: Pharmacist

## 2024-06-07 ENCOUNTER — Other Ambulatory Visit (HOSPITAL_BASED_OUTPATIENT_CLINIC_OR_DEPARTMENT_OTHER): Payer: Self-pay

## 2024-06-07 ENCOUNTER — Encounter: Payer: Self-pay | Admitting: Hematology and Oncology

## 2024-06-07 ENCOUNTER — Other Ambulatory Visit: Payer: Self-pay

## 2024-06-07 MED ORDER — OMEPRAZOLE 40 MG PO CPDR
40.0000 mg | DELAYED_RELEASE_CAPSULE | Freq: Every day | ORAL | 3 refills | Status: AC
  Filled 2024-06-07: qty 90, 90d supply, fill #0
  Filled 2024-08-24: qty 90, 90d supply, fill #1
  Filled 2024-09-07 (×2): qty 30, 30d supply, fill #1
  Filled 2024-10-07: qty 90, 90d supply, fill #2

## 2024-06-09 ENCOUNTER — Other Ambulatory Visit (HOSPITAL_COMMUNITY): Payer: Self-pay

## 2024-06-10 ENCOUNTER — Other Ambulatory Visit: Payer: Self-pay

## 2024-06-10 ENCOUNTER — Other Ambulatory Visit (HOSPITAL_COMMUNITY): Payer: Self-pay

## 2024-06-12 ENCOUNTER — Encounter: Payer: Self-pay | Admitting: Internal Medicine

## 2024-06-12 ENCOUNTER — Ambulatory Visit: Admitting: Internal Medicine

## 2024-06-13 ENCOUNTER — Encounter: Payer: Self-pay | Admitting: Hematology and Oncology

## 2024-06-13 ENCOUNTER — Telehealth: Admitting: Internal Medicine

## 2024-06-13 ENCOUNTER — Encounter: Payer: Self-pay | Admitting: Internal Medicine

## 2024-06-13 DIAGNOSIS — F5104 Psychophysiologic insomnia: Secondary | ICD-10-CM

## 2024-06-13 DIAGNOSIS — R0683 Snoring: Secondary | ICD-10-CM | POA: Diagnosis not present

## 2024-06-13 DIAGNOSIS — G4719 Other hypersomnia: Secondary | ICD-10-CM

## 2024-06-13 NOTE — Progress Notes (Signed)
 Patient ID: Sandra Miller, female   DOB: 04-05-1976, 48 y.o.   MRN: [id number] Virtual Visit via Video Note  I connected with Sandra Miller on 06/13/2024 at 8:11 AM by a video enabled telemedicine application and verified that I am speaking with the correct person using two identifiers.  Location: Patient: parked in her car at work Provider: Office   I discussed the limitations of evaluation and management by telemedicine and the availability of in person appointments. The patient expressed understanding and agreed to proceed.  History of Present Illness: Patient with history of HTN, HL, preDM, hypokalemia,  obesity, tob dep, RLS, DVT of the RT IJV and surrounding IJ port 04/2020 GERD/functional dyspepsia (on gabapentin  and  omeprazole ), hidradenitis suppurativa, breast CA left dx 02/2020 (ERP s/p left mastectomy, neoadjuvant chemotherapy and adjuvant XRT; Dr. Gudena).  Vulvar cancer (dx 01/2020), HSV infection, genitial warts, bipolar disorder/dep/anx (Dr. Curry), has applied for disability, Hyperthyroid   Discussed the use of AI scribe software for clinical note transcription with the patient, who gave verbal consent to proceed.  History of Present Illness Sandra Miller is a 48 year old female who presents with concerns of a sleep disorder.  She has been experiencing significant sleep disturbances over the past few yrs and worse over past several days, characterized by going to bed at 9 PM, waking up at 11 PM, and then waking up every hour thereafter. Once she wakes up at 2 AM, she is unable to return to sleep, resulting in only about four hours of sleep per night.  Her granddaughter has informed her that she snores, although she is unsure of the severity. She experiences a  questionable dry throat upon waking, which she associates with snoring. She has tried nasal strips, but they cause skin irritation. She never feels refreshed upon waking and experiences persistent headaches, though not  specifically in the morning. She feels sleepy during the day but does not fall asleep while driving or sitting still. No witnessed apneas during sleep have been reported.  She has attempted using Unisom natural sleep aid, which contains 5 mg of melatonin, but found it only helped her fall asleep initially and left her feeling hungover for two days. She reports going to bed at a consistent time, not watching TV, and not using her phone in bed. She consumes caffeinated beverages during the day but not at night.  She takes Ativan  1 mg at night for anxiety, which she feels is well-controlled and not contributing to her sleep issues. She has been on Ativan  for years but does not believe it has ever improved her sleep.    Outpatient Encounter Medications as of 06/13/2024  Medication Sig   acetaminophen  (TYLENOL ) 500 MG tablet Take 500 mg by mouth every 6 (six) hours as needed.   albuterol (VENTOLIN HFA) 108 (90 Base) MCG/ACT inhaler Inhale 2 puffs into the lungs every 6 (six) hours as needed for wheezing or shortness of breath.   Brexpiprazole  (REXULTI ) 0.5 MG TABS Take 1 tablet (0.5 mg total) by mouth daily.   famotidine  (PEPCID ) 40 MG tablet Take 1 tablet (40 mg total) by mouth daily.   FLUoxetine  (PROZAC ) 20 MG capsule Take 1 capsule (20 mg total) by mouth daily.   Fremanezumab -vfrm (AJOVY ) 225 MG/1.5ML SOAJ Inject 225 mg into the skin every 30 (thirty) days.   gabapentin  (NEURONTIN ) 300 MG capsule Take 1 capsule (300 mg) by mouth daily.   LamoTRIgine  (LAMICTAL  XR) 200 MG TB24 24 hour tablet Take  1 tablet (200 mg total) by mouth at bedtime.   letrozole  (FEMARA ) 2.5 MG tablet Take 1 tablet by mouth daily.   lisinopril  (ZESTRIL ) 10 MG tablet Take 1 tablet (10 mg total) by mouth daily.   loratadine (CLARITIN REDITABS) 10 MG dissolvable tablet Take 10 mg by mouth daily. OTC   LORazepam  (ATIVAN ) 1 MG tablet Take 1 tablet (1 mg total) by mouth 3 (three) times daily as needed for anxiety   Magnesium  400  MG TABS Take 800 mg by mouth 2 (two) times daily.   Multiple Vitamin (MULTIVITAMIN WITH MINERALS) TABS tablet Take 1 tablet by mouth daily.   omeprazole  (PRILOSEC) 40 MG capsule Take 1 capsule (40 mg total) by mouth daily before breakfast.   potassium chloride  SA (KLOR-CON  M) 20 MEQ tablet Take 1 tablet (20 mEq total) by mouth daily.   promethazine  (PHENERGAN ) 12.5 MG tablet Take 1 tablet (12.5 mg total) by mouth every 8 (eight) hours as needed for nausea or vomiting.   propranolol  (INDERAL ) 20 MG tablet Take 1 tablet (20 mg total) by mouth 2 (two) times daily.   spironolactone  (ALDACTONE ) 25 MG tablet Take 0.5 tablets (12.5 mg total) by mouth daily.   Ubrogepant  (UBRELVY ) 50 MG TABS Take 50 mg as needed for migraine, a second dose may be taken at least 2 hours after the initial dose. DO NOT EXCEED 2 doses in 24 hours.   No facility-administered encounter medications on file as of 06/13/2024.      Observations/Objective: Middle age AAF in NAD  Assessment and Plan: 1. Chronic insomnia (Primary) Patient with chronic insomnia with predominant issue being sleep maintenance.  We went over good sleep hygiene with her which it seems as though she is already doing. Good sleep hygiene discussed and encouraged.  She may have a component of sleep apnea for which home sleep study is worth doing. Patient advised not to drink any caffeinated beverages or excessive alcohol use within several hours of bedtime.  Advised to get in bed around about the same time every night.  Once in bed, turn off all lights and sounds.  If unable to fall asleep within 30 to 45 minutes of getting in bed, patient should get up and try to do something until she feels sleepy again.  At that time try getting back in bed. - Sometimes patients can develop tolerance to Ativan  which she has been on for several years but patient reports that she never felt she slept good even with the Ativan . - Since she did have some response with  melatonin 5 mg but made her feel too hung over, I recommend purchasing melatonin 3 mg instead and taking it about an hour before bedtime to see if it will help with sleep maintenance.   2. Snoring Patient report snoring as indicated to her by her granddaughter and also has excessive daytime sleepiness from nonrestorative sleep.  I think sleep apnea should be ruled out.  She is agreeable to doing a home sleep study. - Home sleep test  3. Excessive daytime sleepiness See #2 above. - Home sleep test   Follow Up Instructions: As scheduled for December   I discussed the assessment and treatment plan with the patient. The patient was provided an opportunity to ask questions and all were answered. The patient agreed with the plan and demonstrated an understanding of the instructions.   The patient was advised to call back or seek an in-person evaluation if the symptoms worsen or if the condition  fails to improve as anticipated.  I spent 19 minutes dedicated to the care of this patient on the date of this encounter to include previsit review of of my last note, face-to-face time with patient discussing diagnosis and management and post visit entering of orders.  This note has been created with Education officer, environmental. Any transcriptional errors are unintentional.  Barnie Louder, MD

## 2024-06-14 ENCOUNTER — Ambulatory Visit: Admitting: Gynecologic Oncology

## 2024-06-19 ENCOUNTER — Encounter: Payer: Self-pay | Admitting: Gynecologic Oncology

## 2024-06-20 ENCOUNTER — Encounter: Payer: Self-pay | Admitting: Cardiology

## 2024-06-21 ENCOUNTER — Inpatient Hospital Stay: Attending: Gynecologic Oncology | Admitting: Gynecologic Oncology

## 2024-06-21 ENCOUNTER — Other Ambulatory Visit (HOSPITAL_COMMUNITY): Payer: Self-pay

## 2024-06-21 ENCOUNTER — Encounter: Payer: Self-pay | Admitting: Gynecologic Oncology

## 2024-06-21 ENCOUNTER — Other Ambulatory Visit: Payer: Self-pay

## 2024-06-21 VITALS — BP 142/76 | HR 100 | Temp 99.1°F | Resp 20 | Wt 228.4 lb

## 2024-06-21 DIAGNOSIS — Z8544 Personal history of malignant neoplasm of other female genital organs: Secondary | ICD-10-CM | POA: Insufficient documentation

## 2024-06-21 DIAGNOSIS — C519 Malignant neoplasm of vulva, unspecified: Secondary | ICD-10-CM

## 2024-06-21 DIAGNOSIS — Z9221 Personal history of antineoplastic chemotherapy: Secondary | ICD-10-CM | POA: Insufficient documentation

## 2024-06-21 DIAGNOSIS — Z90722 Acquired absence of ovaries, bilateral: Secondary | ICD-10-CM | POA: Insufficient documentation

## 2024-06-21 DIAGNOSIS — Z90710 Acquired absence of both cervix and uterus: Secondary | ICD-10-CM | POA: Diagnosis not present

## 2024-06-21 DIAGNOSIS — Z9012 Acquired absence of left breast and nipple: Secondary | ICD-10-CM | POA: Insufficient documentation

## 2024-06-21 DIAGNOSIS — Z853 Personal history of malignant neoplasm of breast: Secondary | ICD-10-CM | POA: Diagnosis not present

## 2024-06-21 DIAGNOSIS — Z1501 Genetic susceptibility to malignant neoplasm of breast: Secondary | ICD-10-CM | POA: Diagnosis not present

## 2024-06-21 DIAGNOSIS — Z923 Personal history of irradiation: Secondary | ICD-10-CM | POA: Diagnosis not present

## 2024-06-21 DIAGNOSIS — Z9079 Acquired absence of other genital organ(s): Secondary | ICD-10-CM | POA: Insufficient documentation

## 2024-06-21 DIAGNOSIS — Z1502 Genetic susceptibility to malignant neoplasm of ovary: Secondary | ICD-10-CM | POA: Insufficient documentation

## 2024-06-21 NOTE — Progress Notes (Signed)
 Gynecologic Oncology Return Clinic Visit  06/21/24  Reason for Visit: surveillance   Treatment History: Oncology History  Vulvar cancer (HCC)  01/29/2020 Initial Diagnosis   Vulvar cancer (HCC)   03/11/2020 - 05/04/2020 Chemotherapy   The patient had dexamethasone  (DECADRON ) 4 MG tablet, 4 mg (100 % of original dose 4 mg), Oral, Daily, 1 of 1 cycle, Start date: 02/27/2020, End date: 05/05/2020 Dose modification: 4 mg (original dose 4 mg, Cycle 0) palonosetron  (ALOXI ) injection 0.25 mg, 0.25 mg, Intravenous,  Once, 1 of 1 cycle Administration: 0.25 mg (03/11/2020), 0.25 mg (03/17/2020), 0.25 mg (03/24/2020), 0.25 mg (03/31/2020), 0.25 mg (04/07/2020), 0.25 mg (04/14/2020), 0.25 mg (04/21/2020) CARBOplatin  (PARAPLATIN ) 300 mg in sodium chloride  0.9 % 250 mL chemo infusion, 300 mg (100 % of original dose 300 mg), Intravenous,  Once, 1 of 1 cycle Dose modification:   (original dose 300 mg, Cycle 1) Administration: 300 mg (03/11/2020), 290 mg (03/17/2020), 300 mg (03/24/2020), 230 mg (03/31/2020), 230 mg (04/07/2020), 230 mg (04/14/2020) PACLitaxel  (TAXOL ) 108 mg in sodium chloride  0.9 % 250 mL chemo infusion (</= 80mg /m2), 50 mg/m2 = 108 mg, Intravenous,  Once, 1 of 1 cycle Dose modification: 45 mg/m2 (original dose 50 mg/m2, Cycle 1, Reason: Dose not tolerated) Administration: 108 mg (03/11/2020), 108 mg (03/17/2020), 108 mg (03/24/2020), 96 mg (03/31/2020), 96 mg (04/07/2020), 96 mg (04/14/2020) fosaprepitant  (EMEND) 150 mg in sodium chloride  0.9 % 145 mL IVPB, 150 mg, Intravenous,  Once, 1 of 1 cycle Administration: 150 mg (03/24/2020), 150 mg (03/31/2020), 150 mg (04/07/2020), 150 mg (04/14/2020) trastuzumab -anns (KANJINTI ) 378 mg in sodium chloride  0.9 % 250 mL chemo infusion, 4 mg/kg = 378 mg (100 % of original dose 4 mg/kg), Intravenous,  Once, 1 of 1 cycle Dose modification: 4 mg/kg (original dose 4 mg/kg, Cycle 1, Reason: Other (see comments), Comment: loading dose) Administration: 378 mg (03/11/2020), 189 mg (03/17/2020),  189 mg (03/24/2020), 189 mg (03/31/2020), 189 mg (04/07/2020), 189 mg (04/14/2020), 189 mg (04/21/2020), 189 mg (05/04/2020)  for chemotherapy treatment.    03/16/2020 - 05/08/2020 Radiation Therapy   Patient received 45 Gy in 25 treatments to pelvis Boost received 16.2 Gy in 9 treatments to vulva    Genetic Testing   Patient has genetic testing done for 06/22/2020. Results revealed patient has the following mutation(s): BRIP 1 mutation   Malignant neoplasm of upper-inner quadrant of left breast in female, estrogen receptor positive (HCC)  02/20/2020 Initial Diagnosis   PET scan on 02/07/20 following a diagnosis of vulvar cancer showed a 1.7cm left breast mass and mildly hypermetabolic left axillary lymph nodes. Mammogram and US  on 02/18/20 showed a 3.1cm left breast mass at the 11 o'clock position, palpable on exam, and a single abnormal left axillary lymph node with cortical thickening. Left breast biopsy on 02/20/20 showed invasive and in situ carcinoma in the breast and axilla, grade 3, HER-2 positive (3+), ER/PR+ 95%, Ki67 75%   02/25/2020 Cancer Staging   Staging form: Breast, AJCC 8th Edition - Clinical stage from 02/25/2020: Stage IB (cT2, cN1, cM0, G3, ER+, PR+, HER2+) - Signed by Odean Potts, MD on 02/25/2020   05/29/2020 - 05/29/2020 Chemotherapy   The patient had palonosetron  (ALOXI ) injection 0.25 mg, 0.25 mg, Intravenous,  Once, 1 of 3 cycles Administration: 0.25 mg (05/29/2020) pegfilgrastim-jmdb (FULPHILA) injection 6 mg, 6 mg, Subcutaneous,  Once, 1 of 3 cycles CARBOplatin  (PARAPLATIN ) 580 mg in sodium chloride  0.9 % 250 mL chemo infusion, 580 mg (100 % of original dose 580 mg), Intravenous,  Once,  1 of 3 cycles Dose modification:   (original dose 580 mg, Cycle 1) Administration: 580 mg (05/29/2020) DOCEtaxel  (TAXOTERE ) 120 mg in sodium chloride  0.9 % 250 mL chemo infusion, 60 mg/m2 = 120 mg (100 % of original dose 60 mg/m2), Intravenous,  Once, 1 of 3 cycles Dose modification: 60 mg/m2  (original dose 60 mg/m2, Cycle 1, Reason: Provider Judgment), 50 mg/m2 (original dose 60 mg/m2, Cycle 2, Reason: Dose not tolerated) Administration: 120 mg (05/29/2020) fosaprepitant  (EMEND) 150 mg in sodium chloride  0.9 % 145 mL IVPB, 150 mg, Intravenous,  Once, 1 of 3 cycles Administration: 150 mg (05/29/2020) pertuzumab  (PERJETA ) 420 mg in sodium chloride  0.9 % 250 mL chemo infusion, 420 mg (50 % of original dose 840 mg), Intravenous, Once, 1 of 3 cycles Dose modification: 420 mg (original dose 840 mg, Cycle 1, Reason: Provider Judgment) Administration: 420 mg (05/29/2020) trastuzumab -dkst (OGIVRI ) 525 mg in sodium chloride  0.9 % 250 mL chemo infusion, 6 mg/kg = 525 mg (75 % of original dose 8 mg/kg), Intravenous,  Once, 1 of 3 cycles Dose modification: 6 mg/kg (original dose 8 mg/kg, Cycle 1, Reason: Provider Judgment) Administration: 525 mg (05/29/2020)  for chemotherapy treatment.    06/22/2020 Genetic Testing   Positive genetic testing: Heterozygous pathogenic variant detected in BRIP1 gene at c.2010dup (p.Glu671*).  Variant of uncertain significance detected in POLD1 at c.532G>A (p.Gly178Arg).  No other pathogenic variants detected in [facility] Common Hereditary Cancers Panel.  The report date is June 22, 2020.   The Common Hereditary Cancers Panel offered by [facility] includes sequencing and/or deletion duplication testing of the following 48 genes: APC, ATM, AXIN2, BARD1, BMPR1A, BRCA1, BRCA2, BRIP1, CDH1, CDK4, CDKN2A (p14ARF), CDKN2A (p16INK4a), CHEK2, CTNNA1, DICER1, EPCAM (Deletion/duplication testing only), GREM1 (promoter region deletion/duplication testing only), KIT, MEN1, MLH1, MSH2, MSH3, MSH6, MUTYH, NBN, NF1, NHTL1, PALB2, PDGFRA, PMS2, POLD1, POLE, PTEN, RAD50, RAD51C, RAD51D, RNF43, SDHB, SDHC, SDHD, SMAD4, SMARCA4. STK11, TP53, TSC1, TSC2, and VHL.  The following genes were evaluated for sequence changes only: SDHA and HOXB13 c.251G>A variant only.   07/27/2020 Surgery   Left  mastectomy [name]: three foci of IDC, 3.7cm, 1.5cm, and 1.2cm, with intermediate to high grade DCIS, clear margins, with 1/2 left axillary lymph nodes positive for carcinoma.    08/18/2020 - 04/06/2021 Chemotherapy   Patient is on Treatment Plan : BREAST ADO-Trastuzumab Emtansine  (Kadcyla ) q21d     09/09/2020 - 10/23/2020 Radiation Therapy   Adjuvant radiation    Sandra Miller is a 48 year old parous woman with a history of vulvar cancer who was seen in consultation at the request of Dr Odean for evaluation of a deleterious mutation in BRIP1.   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  At the time of seeking consultation for sentinel lymph node biopsy with Dr. Fleeta Daniels at [facility], it was determined that she was not a good candidate for primary radical vulvectomy due to the close proximity of the lesion to her anal sphincter.  Therefore she was treated with definitive external beam radiation to the lower pelvis and vulvar tissues.  Radiation dosing was between 03/16/20 through 05/08/20. It consisted of IMRT to the pelvis (45 Gy) and IMRT boost to the vulva (16.2 Gy).   Simultaneous to her diagnosis of vulvar cancer was a diagnosis of ER/PR positive breast cancer on the left.  This was stage II with positive lymph node.  She underwent treatment with Adriamycin  and Taxotere  followed by left mastectomy, trastuzumab  infusion for HER-2 positive lesion, and was planned for chest wall radiation beginning in the new year of 2022.   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   Screening ultrasound of the pelvis on 11/06/2020 revealed a uterus measuring 9.1 x 3.3 x 4.3 cm with an endometrial thickness of 6 mm and bilateral ovaries which were normal in size and appearance.   On 05/18/21 she underwent  robotic assisted total hysterectomy, BSO and vulvar biopsy. Intraoperative findings were significant for complete clinical response at vulva, normal appearing tubes and ovaries, normal appearing fibroid uterus. Surgery was uncomplicated.  Final pathology revealed benign tubes , ovaries, uterus. VIN 1 at vulva.  Interval History: Denies any vulvar pain or pruritus.  No bleeding.  Reports normal bowel and bladder function.  Continues to struggle with hot flashes.  Endorses fatigue.  Past Medical/Surgical History: Past Medical History:  Diagnosis Date   Allergy    Arthritis    Bipolar 1 disorder (HCC)    Cancer (HCC)    vulva, and breast   Chronic kidney disease    Clotting disorder    Complication of anesthesia    wakes up during procedures   Depression    GAD (generalized anxiety disorder)    Genital HSV    currently per pt  no break out 03-22-2016    GERD (gastroesophageal reflux disease)    Graves disease    Hiatal hernia    History of cervical dysplasia    2012 laser ablation   History of esophageal dilatation    for dysphasia -- x2 dilated   History of gastric ulcer    History of Helicobacter pylori infection    remote hx   History of hidradenitis suppurativa    gets all over body intermittantly     History of hypertension    no issue since stopped drinking alcohol 2014   History of kidney stones    History of panic attacks    History of radiation therapy 03/16/2020-05/08/2020   vulva  Dr Lynwood Nasuti   History of radiation therapy 09/09/2020-10/23/2020   left chest wall/left SCV   Dr Lynwood Nasuti   Hyperlipidemia    Hypertension    Iron deficiency anemia    Left ureteral stone    OCD (obsessive compulsive disorder)    PONV (postoperative nausea and vomiting)    Pre-diabetes    PTSD (post-traumatic stress disorder)    Recovering alcoholic in remission (HCC)    since 2014   RLS (restless legs syndrome)    Seizures (HCC)    Smokers' cough (HCC)    Thyroid  disease     Urgency of urination    Yeast infection involving the vagina and surrounding area    secondary to taking antibiotic    Past Surgical History:  Procedure Laterality Date   CESAREAN SECTION  1995   w/  Bilateral Tubal Ligation   COLONOSCOPY  last one 08-09-2013   CYSTOSCOPY W/ URETERAL STENT PLACEMENT Left 03/29/2016   Procedure: CYSTOSCOPY WITH STENT REPLACEMENT;  Surgeon: Redell Lynwood Napoleon, MD;  [facility];  Service: Urology;  Laterality: Left;   CYSTOSCOPY WITH RETROGRADE PYELOGRAM, URETEROSCOPY AND STENT PLACEMENT Left 03/08/2016   Procedure: CYSTOSCOPY WITH  LEFT RETROGRADE PYELOGRAM, AND STENT PLACEMENT;  Surgeon: Redell Lynwood Napoleon, MD;  Location: [facility];  Service:  Urology;  Laterality: Left;   CYSTOSCOPY/RETROGRADE/URETEROSCOPY/STONE EXTRACTION WITH BASKET Left 03/29/2016   Procedure: CYSTOSCOPY/RETROGRADE/URETEROSCOPY/STONE EXTRACTION WITH BASKET;  Surgeon: Redell Lynwood Napoleon, MD;  Location[facility];  Service: Urology;  Laterality: Left;   ENDOMETRIAL ABLATION W/ NOVASURE  04-01-2010   ESOPHAGOGASTRODUODENOSCOPY  last one 08-09-2013   KNEE ARTHROSCOPY Left as teen   LASER ABLATION OF THE CERVIX  2012 approx   MASTECTOMY WITH AXILLARY LYMPH NODE DISSECTION Left 07/27/2020   Procedure: LEFT MASTECTOMY WITH LEFT RADIOACTIVE SEED GUIDED TARGETED AXILLARY LYMPH NODE DISSECTION;  Surgeon: Ethyl Lenis, MD;  Location: [facility];  Service: General;  Laterality: Left;   PORT-A-CATH REMOVAL Right 05/18/2021   Procedure: REMOVAL PORT-A-CATH;  Surgeon: Belinda Cough, MD;  Location: [facility];  Service: General;  Laterality: Right;   PORTACATH PLACEMENT Right 03/06/2020   Procedure: INSERTION PORT-A-CATH WITH ULTRASOUND GUIDANCE;  Surgeon: Ethyl Lenis, MD;  Location: [facility];  Service: General;  Laterality: Right;   ROBOTIC ASSISTED TOTAL HYSTERECTOMY WITH BILATERAL SALPINGO OOPHERECTOMY N/A 05/18/2021   Procedure: XI ROBOTIC ASSISTED TOTAL  HYSTERECTOMY WITH BILATERAL SALPINGO OOPHORECTOMY;  Surgeon: Eloy Herring, MD;  Location: [facility];  Service: Gynecology;  Laterality: N/A;   TRANSTHORACIC ECHOCARDIOGRAM  05-19-2006   lvsf normal, ef 55-65%, there was mild flattening of the interventricular septum during diastoli/  RV size at upper limits normal   TUBAL LIGATION     VULVA /PERINEUM BIOPSY N/A 05/18/2021   Procedure: VULVAR BIOPSY;  Surgeon: Eloy Herring, MD;  Location: [facility];  Service: Gynecology;  Laterality: N/A;   WISDOM TOOTH EXTRACTION  age 1 's    Family History  Problem Relation Age of Onset   Heart disease Mother    Depression Mother    Anxiety disorder Mother    Heart disease Father    Lung cancer Father        d. 52   Alcohol abuse Father    Drug abuse Brother    Alcohol abuse Brother    Drug abuse Brother    ADD / ADHD Brother    Colon polyps Brother    Anxiety disorder Maternal Aunt    Depression Maternal Aunt    Kidney disease Maternal Uncle    Lung cancer Paternal Uncle 75   Diabetes Maternal Grandfather    Diabetes Paternal Grandmother    Cancer Paternal Grandfather        stomach   Cirrhosis Cousin        alcoholic   Cancer Cousin        maternal; ovarian cancer or other female cancer?   Throat cancer Cousin        paternal; dx 64s   Lung cancer Cousin        paternal; dx 26s   Breast cancer Cousin        paternal   Breast cancer Cousin        paternal   Colon cancer Neg Hx    Esophageal cancer Neg Hx    Stomach cancer Neg Hx    Rectal cancer Neg Hx     Social History   Socioeconomic History   Marital status: Single    Spouse name: Not on file   Number of children: Not on file   Years of education: Not on file   Highest education level: Associate degree: academic program  Occupational History   Not on file  Tobacco Use   Smoking status: Every Day    Current packs/day: 0.50  Types: Cigarettes   Smokeless tobacco: Never   Tobacco comments:    Recently started a  smoking cessation class. Quit 03/31/23  Vaping Use   Vaping status: Never Used  Substance and Sexual Activity   Alcohol use: Not Currently   Drug use: Not Currently    Types: Marijuana    Comment: none in about 1 year   Sexual activity: Yes    Partners: Male    Birth control/protection: Surgical  Other Topics Concern   Not on file  Social History Narrative   Former [facility] patient.      Was on disability at one point.   Return to the workforce.  40 hours a week at [employer], 10 hours a week on the weekends at [employer] in [place], [employer] at night 10 to 12 hours a week.      Has grown children, she lives alone with a pet, continues to smoke no alcohol or drug use at this time      History of EtOH abuse and THC use.         Social Drivers of Corporate investment banker Strain: Low Risk  (05/07/2024)   Overall Financial Resource Strain (CARDIA)    Difficulty of Paying Living Expenses: Not hard at all  Food Insecurity: No Food Insecurity (05/07/2024)   Hunger Vital Sign    Worried About Running Out of Food in the Last Year: Never true    Ran Out of Food in the Last Year: Never true  Transportation Needs: No Transportation Needs (05/07/2024)   PRAPARE - Administrator, Civil Service (Medical): No    Lack of Transportation (Non-Medical): No  Physical Activity: Inactive (05/07/2024)   Exercise Vital Sign    Days of Exercise per Week: 0 days    Minutes of Exercise per Session: 0 min  Stress: Stress Concern Present (05/07/2024)   [employer] of Occupational Health - Occupational Stress Questionnaire    Feeling of Stress: Very much  Social Connections: Socially Isolated (05/07/2024)   Social Connection and Isolation Panel    Frequency of Communication with Friends and Family: More than three times a week    Frequency of Social Gatherings with Friends and Family: Once a week    Attends Religious Services: Never    Database administrator or Organizations:  No    Attends Banker Meetings: Never    Marital Status: Never married    Current Medications:  Current Outpatient Medications:    acetaminophen  (TYLENOL ) 500 MG tablet, Take 500 mg by mouth every 6 (six) hours as needed., Disp: , Rfl:    albuterol (VENTOLIN HFA) 108 (90 Base) MCG/ACT inhaler, Inhale 2 puffs into the lungs every 6 (six) hours as needed for wheezing or shortness of breath., Disp: , Rfl:    Brexpiprazole  (REXULTI ) 0.5 MG TABS, Take 1 tablet (0.5 mg total) by mouth daily., Disp: 30 tablet, Rfl: 1   famotidine  (PEPCID ) 40 MG tablet, Take 1 tablet (40 mg total) by mouth daily., Disp: 90 tablet, Rfl: 3   FLUoxetine  (PROZAC ) 20 MG capsule, Take 1 capsule (20 mg total) by mouth daily., Disp: 30 capsule, Rfl: 1   Fremanezumab -vfrm (AJOVY ) 225 MG/1.5ML SOAJ, Inject 225 mg into the skin every 30 (thirty) days., Disp: 1.5 mL, Rfl: 11   gabapentin  (NEURONTIN ) 300 MG capsule, Take 1 capsule (300 mg) by mouth daily., Disp: 30 capsule, Rfl: 1   LamoTRIgine  (LAMICTAL  XR) 200 MG TB24 24 hour  tablet, Take 1 tablet (200 mg total) by mouth at bedtime., Disp: 90 tablet, Rfl: 4   letrozole  (FEMARA ) 2.5 MG tablet, Take 1 tablet by mouth daily., Disp: 90 tablet, Rfl: 3   lisinopril  (ZESTRIL ) 10 MG tablet, Take 1 tablet (10 mg total) by mouth daily., Disp: 90 tablet, Rfl: 1   loratadine (CLARITIN REDITABS) 10 MG dissolvable tablet, Take 10 mg by mouth daily. OTC, Disp: , Rfl:    LORazepam  (ATIVAN ) 1 MG tablet, Take 1 tablet (1 mg total) by mouth 3 (three) times daily as needed for anxiety, Disp: 90 tablet, Rfl: 1   Magnesium  400 MG TABS, Take 800 mg by mouth 2 (two) times daily., Disp: 120 tablet, Rfl: 1   Multiple Vitamin (MULTIVITAMIN WITH MINERALS) TABS tablet, Take 1 tablet by mouth daily., Disp: , Rfl:    omeprazole  (PRILOSEC) 40 MG capsule, Take 1 capsule (40 mg total) by mouth daily before breakfast., Disp: 90 capsule, Rfl: 3   potassium chloride  SA (KLOR-CON  M) 20 MEQ tablet, Take  1 tablet (20 mEq total) by mouth daily., Disp: 90 tablet, Rfl: 1   promethazine  (PHENERGAN ) 12.5 MG tablet, Take 1 tablet (12.5 mg total) by mouth every 8 (eight) hours as needed for nausea or vomiting., Disp: 30 tablet, Rfl: 0   propranolol  (INDERAL ) 20 MG tablet, Take 1 tablet (20 mg total) by mouth 2 (two) times daily., Disp: 60 tablet, Rfl: 1   spironolactone  (ALDACTONE ) 25 MG tablet, Take 0.5 tablets (12.5 mg total) by mouth daily., Disp: 45 tablet, Rfl: 1   Ubrogepant  (UBRELVY ) 50 MG TABS, Take 50 mg as needed for migraine, a second dose may be taken at least 2 hours after the initial dose. DO NOT EXCEED 2 doses in 24 hours., Disp: 10 tablet, Rfl: 1  Review of Systems: + Fatigue, palpitations, hot flashes, headache, anxiety Denies appetite changes, fevers, chills, unexplained weight changes. Denies hearing loss, neck lumps or masses, mouth sores, ringing in ears or voice changes. Denies cough or wheezing.  Denies shortness of breath. Denies chest pain. Denies leg swelling. Denies abdominal distention, pain, blood in stools, constipation, diarrhea, nausea, vomiting, or early satiety. Denies pain with intercourse, dysuria, frequency, hematuria or incontinence. Denies pelvic pain, vaginal bleeding or vaginal discharge.   Denies joint pain, back pain or muscle pain/cramps. Denies itching, rash, or wounds. Denies dizziness, numbness or seizures. Denies swollen lymph nodes or glands, denies easy bruising or bleeding. Denies depression, confusion, or decreased concentration.  Physical Exam: BP (!) 142/76 Comment: manual recheck  Pulse 100   Temp 99.1 F (37.3 C) (Oral)   Resp 20   Wt 228 lb 6.4 oz (103.6 kg)   SpO2 100%   BMI 36.86 kg/m  General: Alert, oriented, no acute distress. HEENT: Normocephalic, atraumatic, sclera anicteric. Chest: Clear to auscultation bilaterally.  No wheezes or rhonchi. Cardiovascular: Regular rate and rhythm, no murmurs. Abdomen: soft, nontender.   Normoactive bowel sounds.  No masses or hepatosplenomegaly appreciated.   Extremities: Grossly normal range of motion.  Warm, well perfused.  No edema bilaterally. Lymphatics: No cervical, supraclavicular, or inguinal adenopathy. GU: External female genitalia with changes consistent with history of radiation. No visible lesions, hyperkeratosis, or ulcerations.  Mild hypopigmentation along the perineal body, unchanged.  No acetowhite lesions noted on vulvoscopy.  On speculum exam, vaginal mucosa is visually normal without lesions.  On bimanual exam, no masses or nodularity  Laboratory & Radiologic Studies: None new  Assessment & Plan: Sandra Miller is a 48 y.o.  woman with a history of stage IB vulvar cancer s/p definitive radiation completed in 05/2020, s/p diagnosis of HR positive left breast cancer, and deleterious mutation in BRIP1 who underwent risk reducing hysterectomy and BSO in 05/2021.  Presents today for surveillance visit.   Patient is overall doing well and is NED on vulvar exam today.    She is now approximately 4 years from completing adjuvant therapy for her vulvar cancer. We will continue with visits every 6 months.  She will see Dr. Shannon for her next visit in 6 and return to our our clinic in 1 year.    In terms of her deleterious mutation in BRIP 1 conferring increased hereditary risk of ovarian cancer, she is s/p robotic assisted total hysterectomy with BSO for risk reduction in September, 2022.   Given prior Paps with HPV (last pap 12/2019 was NILM, HR HPV negative), plan for cotesting in 12/2024.   22 minutes of total time was spent for this patient encounter, including preparation, face-to-face counseling with the patient and coordination of care, and documentation of the encounter.  Comer Dollar, MD  Division of Gynecologic Oncology  Department of Obstetrics and Gynecology  [facility]

## 2024-06-21 NOTE — Patient Instructions (Signed)
 It was good to see you today.  I do not see or feel any evidence of cancer recurrence on your exam.  We will see you for follow-up in 12 months.  As always, if you develop any new and concerning symptoms before your next visit, please call to see me sooner.

## 2024-06-23 NOTE — Telephone Encounter (Signed)
 Can we schedule her an appointment?  Looks like I have openings on 11/5 or 11/6

## 2024-06-24 ENCOUNTER — Other Ambulatory Visit: Payer: Self-pay

## 2024-06-29 ENCOUNTER — Encounter: Payer: Self-pay | Admitting: Adult Health

## 2024-06-29 ENCOUNTER — Other Ambulatory Visit (HOSPITAL_COMMUNITY): Payer: Self-pay

## 2024-07-01 ENCOUNTER — Other Ambulatory Visit: Payer: Self-pay

## 2024-07-02 ENCOUNTER — Ambulatory Visit
Admission: RE | Admit: 2024-07-02 | Discharge: 2024-07-02 | Disposition: A | Discharge status: Home / Self Care | Source: Ambulatory Visit | Attending: Family Medicine | Admitting: Family Medicine

## 2024-07-02 ENCOUNTER — Ambulatory Visit: Payer: Self-pay | Admitting: Nurse Practitioner

## 2024-07-02 ENCOUNTER — Ambulatory Visit (INDEPENDENT_AMBULATORY_CARE_PROVIDER_SITE_OTHER)

## 2024-07-02 ENCOUNTER — Other Ambulatory Visit (HOSPITAL_COMMUNITY): Payer: Self-pay

## 2024-07-02 ENCOUNTER — Other Ambulatory Visit: Payer: Self-pay

## 2024-07-02 VITALS — BP 163/98 | HR 100 | Temp 99.2°F | Resp 18

## 2024-07-02 DIAGNOSIS — B349 Viral infection, unspecified: Secondary | ICD-10-CM

## 2024-07-02 DIAGNOSIS — J029 Acute pharyngitis, unspecified: Secondary | ICD-10-CM

## 2024-07-02 DIAGNOSIS — R059 Cough, unspecified: Secondary | ICD-10-CM | POA: Diagnosis not present

## 2024-07-02 DIAGNOSIS — J209 Acute bronchitis, unspecified: Secondary | ICD-10-CM | POA: Diagnosis not present

## 2024-07-02 DIAGNOSIS — R051 Acute cough: Secondary | ICD-10-CM | POA: Diagnosis not present

## 2024-07-02 LAB — POC COVID19/FLU A&B COMBO
Covid Antigen, POC: NEGATIVE
Influenza A Antigen, POC: NEGATIVE
Influenza B Antigen, POC: NEGATIVE

## 2024-07-02 LAB — POCT RAPID STREP A (OFFICE): Rapid Strep A Screen: NEGATIVE

## 2024-07-02 MED ORDER — PREDNISONE 20 MG PO TABS: 40.0000 mg | ORAL_TABLET | Freq: Every day | ORAL | 0 refills | Status: AC

## 2024-07-02 MED ORDER — BENZONATATE 200 MG PO CAPS: 200.0000 mg | ORAL_CAPSULE | Freq: Three times a day (TID) | ORAL | 0 refills | Status: AC | PRN

## 2024-07-02 MED ORDER — ALBUTEROL SULFATE HFA 108 (90 BASE) MCG/ACT IN AERS: 1.0000 | INHALATION_SPRAY | Freq: Four times a day (QID) | RESPIRATORY_TRACT | 0 refills | Status: AC | PRN

## 2024-07-02 NOTE — ED Provider Notes (Signed)
 [facility]    CSN: [id number] Arrival date & time: 07/02/24  1121      History   Chief Complaint Chief Complaint  Patient presents with   Cough    Congestion, chills, and body aches. - Entered by patient    HPI Sandra Miller is a 48 y.o. female  presents for evaluation of URI symptoms for 4 days. Patient reports associated symptoms of cough, congestion, headache, sore throat, wheezing and shortness of breath. Denies N/V/D, fevers, ear pain. Patient does not have a hx of asthma. Patient is an active smoker.  States she had pneumonia in February and ever since then has needed an inhaler.  She is currently out.  Reports no known sick contacts but does work with the public.  Pt has taken Coricidin OTC for symptoms. Pt has no other concerns at this time.    Cough Associated symptoms: headaches, myalgias, shortness of breath and sore throat     Past Medical History:  Diagnosis Date   Allergy    Arthritis    Bipolar 1 disorder (HCC)    Cancer (HCC)    vulva, and breast   Chronic kidney disease    Clotting disorder    Complication of anesthesia    wakes up during procedures   Depression    GAD (generalized anxiety disorder)    Genital HSV    currently per pt  no break out 03-22-2016    GERD (gastroesophageal reflux disease)    Graves disease    Hiatal hernia    History of cervical dysplasia    2012 laser ablation   History of esophageal dilatation    for dysphasia -- x2 dilated   History of gastric ulcer    History of Helicobacter pylori infection    remote hx   History of hidradenitis suppurativa    gets all over body intermittantly     History of hypertension    no issue since stopped drinking alcohol 2014   History of kidney stones    History of panic attacks    History of radiation therapy 03/16/2020-05/08/2020   vulva  Dr Lynwood Nasuti   History of radiation therapy 09/09/2020-10/23/2020   left chest wall/left SCV   Dr Lynwood Nasuti   Hyperlipidemia     Hypertension    Iron deficiency anemia    Left ureteral stone    OCD (obsessive compulsive disorder)    PONV (postoperative nausea and vomiting)    Pre-diabetes    PTSD (post-traumatic stress disorder)    Recovering alcoholic in remission (HCC)    since 2014   RLS (restless legs syndrome)    Seizures (HCC)    Smokers' cough (HCC)    Thyroid  disease    Urgency of urination    Yeast infection involving the vagina and surrounding area    secondary to taking antibiotic    Patient Active Problem List   Diagnosis Date Noted   Chronic migraine without aura, with intractable migraine, so stated, with status migrainosus 08/22/2023   Mood disorder 05/29/2023   Fatigue 05/15/2023   Prolonged QT interval 04/10/2023   Dizziness 04/10/2023   Provoked seizures (HCC) 03/31/2023   Respiratory depression 03/31/2023   Status epilepticus (HCC) 03/31/2023   Migraine with aura and without status migrainosus, not intractable 10/03/2022   Pain in right knee 09/07/2022   Bilateral primary osteoarthritis of knee 09/07/2022   Chronic migraine without aura without status migrainosus, not intractable 07/07/2022   Graves disease 05/10/2022  Sebaceous cyst 12/06/2021   Pain in left knee 11/03/2021   Hyperthyroidism 08/13/2021   Bipolar 1 disorder (HCC) 05/07/2021   Obesity (BMI 30.0-34.9) 05/07/2021   Hyperlipidemia 08/12/2020   Prediabetes 08/12/2020   Hypomagnesemia 08/12/2020   Mutation in BRIP1 gene 06/24/2020   Hypokalemia 06/17/2020   Dehydration    Colitis 06/16/2020   Port-A-Cath in place 03/11/2020   Malignant neoplasm of upper-inner quadrant of left breast in female, estrogen receptor positive (HCC) 02/25/2020   Vulvar cancer (HCC) 01/29/2020   Vulvar ulceration 01/23/2020   Essential hypertension 10/31/2019   Nausea and vomiting 12/27/2018   Seasonal allergic rhinitis 12/27/2018   History of ELISA positive for HSV 10/01/2018   Restless leg syndrome 04/01/2016   Nephrolithiasis  03/08/2016   Obstructive pyelonephritis 03/08/2016   Normocytic anemia 03/08/2016   Hidradenitis suppurativa of left axilla 11/03/2015   Loss of weight 09/08/2015   Neck pain on left side 10/22/2014   Genital warts 05/20/2014   GERD (gastroesophageal reflux disease) 12/26/2012   Functional dyspepsia 12/26/2012   Tobacco abuse 08/22/2012   Herpes simplex virus (HSV) infection 02/03/2007    Past Surgical History:  Procedure Laterality Date   CESAREAN SECTION  1995   w/  Bilateral Tubal Ligation   COLONOSCOPY  last one 08-09-2013   CYSTOSCOPY W/ URETERAL STENT PLACEMENT Left 03/29/2016   Procedure: CYSTOSCOPY WITH STENT REPLACEMENT;  Surgeon: Redell Lynwood Napoleon, MD;  Location: [facility];  Service: Urology;  Laterality: Left;   CYSTOSCOPY WITH RETROGRADE PYELOGRAM, URETEROSCOPY AND STENT PLACEMENT Left 03/08/2016   Procedure: CYSTOSCOPY WITH  LEFT RETROGRADE PYELOGRAM, AND STENT PLACEMENT;  Surgeon: Redell Lynwood Napoleon, MD;  Location: [facility];  Service: Urology;  Laterality: Left;   CYSTOSCOPY/RETROGRADE/URETEROSCOPY/STONE EXTRACTION WITH BASKET Left 03/29/2016   Procedure: CYSTOSCOPY/RETROGRADE/URETEROSCOPY/STONE EXTRACTION WITH BASKET;  Surgeon: Redell Lynwood Napoleon, MD;  Location: [facility];  Service: Urology;  Laterality: Left;   ENDOMETRIAL ABLATION W/ NOVASURE  04-01-2010   ESOPHAGOGASTRODUODENOSCOPY  last one 08-09-2013   KNEE ARTHROSCOPY Left as teen   LASER ABLATION OF THE CERVIX  2012 approx   MASTECTOMY WITH AXILLARY LYMPH NODE DISSECTION Left 07/27/2020   Procedure: LEFT MASTECTOMY WITH LEFT RADIOACTIVE SEED GUIDED TARGETED AXILLARY LYMPH NODE DISSECTION;  Surgeon: Ethyl Lenis, MD;  Location: [facility];  Service: General;  Laterality: Left;   PORT-A-CATH REMOVAL Right 05/18/2021   Procedure: REMOVAL PORT-A-CATH;  Surgeon: Belinda Cough, MD;  Location: [facility];  Service: General;  Laterality: Right;   PORTACATH PLACEMENT Right 03/06/2020   Procedure:  INSERTION PORT-A-CATH WITH ULTRASOUND GUIDANCE;  Surgeon: Ethyl Lenis, MD;  Location: [facility];  Service: General;  Laterality: Right;   ROBOTIC ASSISTED TOTAL HYSTERECTOMY WITH BILATERAL SALPINGO OOPHERECTOMY N/A 05/18/2021   Procedure: XI ROBOTIC ASSISTED TOTAL HYSTERECTOMY WITH BILATERAL SALPINGO OOPHORECTOMY;  Surgeon: Eloy Herring, MD;  Location: [facility];  Service: Gynecology;  Laterality: N/A;   TRANSTHORACIC ECHOCARDIOGRAM  05-19-2006   lvsf normal, ef 55-65%, there was mild flattening of the interventricular septum during diastoli/  RV size at upper limits normal   TUBAL LIGATION     VULVA /PERINEUM BIOPSY N/A 05/18/2021   Procedure: VULVAR BIOPSY;  Surgeon: Eloy Herring, MD;  Location: [facility];  Service: Gynecology;  Laterality: N/A;   WISDOM TOOTH EXTRACTION  age 50 's    OB History     Gravida  2   Para  2   Term  2   Preterm      AB  Living  2      SAB      IAB      Ectopic      Multiple      Live Births  2            Home Medications    Prior to Admission medications   Medication Sig Start Date End Date Taking? Authorizing Provider  albuterol (VENTOLIN HFA) 108 (90 Base) MCG/ACT inhaler Inhale 1-2 puffs into the lungs every 6 (six) hours as needed. 07/02/24  Yes Romy Mcgue, Jodi R, NP  benzonatate  (TESSALON ) 200 MG capsule Take 1 capsule (200 mg total) by mouth 3 (three) times daily as needed. 07/02/24  Yes Arvis Miguez, Jodi R, NP  predniSONE  (DELTASONE ) 20 MG tablet Take 2 tablets (40 mg total) by mouth daily with breakfast for 5 days. 07/02/24 07/07/24 Yes Dominiq Fontaine, Jodi R, NP  acetaminophen  (TYLENOL ) 500 MG tablet Take 500 mg by mouth every 6 (six) hours as needed.    [provider]  Brexpiprazole  (REXULTI ) 0.5 MG TABS Take 1 tablet (0.5 mg total) by mouth daily. 06/06/24   Arfeen, Leni DASEN, MD  famotidine  (PEPCID ) 40 MG tablet Take 1 tablet (40 mg total) by mouth daily. 05/24/24   Craig Alan SAUNDERS, PA-C  FLUoxetine  (PROZAC ) 20 MG capsule  Take 1 capsule (20 mg total) by mouth daily. 06/06/24   Arfeen, Leni DASEN, MD  Fremanezumab -vfrm (AJOVY ) 225 MG/1.5ML SOAJ Inject 225 mg into the skin every 30 (thirty) days. 01/11/24   Ines Onetha NOVAK, MD  gabapentin  (NEURONTIN ) 300 MG capsule Take 1 capsule (300 mg) by mouth daily. 05/24/24   Craig Alan SAUNDERS, PA-C  LamoTRIgine  (LAMICTAL  XR) 200 MG TB24 24 hour tablet Take 1 tablet (200 mg total) by mouth at bedtime. 01/11/24   Ines Onetha NOVAK, MD  letrozole  (FEMARA ) 2.5 MG tablet Take 1 tablet by mouth daily. 11/09/23   Gudena, Vinay, MD  lisinopril  (ZESTRIL ) 10 MG tablet Take 1 tablet (10 mg total) by mouth daily. 05/08/24   Vicci Barnie NOVAK, MD  loratadine (CLARITIN REDITABS) 10 MG dissolvable tablet Take 10 mg by mouth daily. OTC    [provider]  LORazepam  (ATIVAN ) 1 MG tablet Take 1 tablet (1 mg total) by mouth 3 (three) times daily as needed for anxiety 06/06/24   Arfeen, Leni DASEN, MD  Magnesium  400 MG TABS Take 800 mg by mouth 2 (two) times daily. 04/22/23   Vicci Barnie NOVAK, MD  Multiple Vitamin (MULTIVITAMIN WITH MINERALS) TABS tablet Take 1 tablet by mouth daily.    [provider]  omeprazole  (PRILOSEC) 40 MG capsule Take 1 capsule (40 mg total) by mouth daily before breakfast. 06/07/24   Craig Alan SAUNDERS, PA-C  potassium chloride  SA (KLOR-CON  M) 20 MEQ tablet Take 1 tablet (20 mEq total) by mouth daily. 05/24/24   Vicci Barnie NOVAK, MD  promethazine  (PHENERGAN ) 12.5 MG tablet Take 1 tablet (12.5 mg total) by mouth every 8 (eight) hours as needed for nausea or vomiting. 05/24/24   Avram Lupita BRAVO, MD  propranolol  (INDERAL ) 20 MG tablet Take 1 tablet (20 mg total) by mouth 2 (two) times daily. 06/06/24   Arfeen, Leni DASEN, MD  spironolactone  (ALDACTONE ) 25 MG tablet Take 0.5 tablets (12.5 mg total) by mouth daily. 05/24/24   Vicci Barnie NOVAK, MD  Ubrogepant  (UBRELVY ) 50 MG TABS Take 50 mg as needed for migraine, a second dose may be taken at least 2 hours after the initial dose. DO  NOT EXCEED 2  doses in 24 hours. 12/21/23   Ines Onetha NOVAK, MD    Family History Family History  Problem Relation Age of Onset   Heart disease Mother    Depression Mother    Anxiety disorder Mother    Heart disease Father    Lung cancer Father        d. 2   Alcohol abuse Father    Drug abuse Brother    Alcohol abuse Brother    Drug abuse Brother    ADD / ADHD Brother    Colon polyps Brother    Anxiety disorder Maternal Aunt    Depression Maternal Aunt    Kidney disease Maternal Uncle    Lung cancer Paternal Uncle 28   Diabetes Maternal Grandfather    Diabetes Paternal Grandmother    Cancer Paternal Grandfather        stomach   Cirrhosis Cousin        alcoholic   Cancer Cousin        maternal; ovarian cancer or other female cancer?   Throat cancer Cousin        paternal; dx 15s   Lung cancer Cousin        paternal; dx 55s   Breast cancer Cousin        paternal   Breast cancer Cousin        paternal   Colon cancer Neg Hx    Esophageal cancer Neg Hx    Stomach cancer Neg Hx    Rectal cancer Neg Hx     Social History Social History   Tobacco Use   Smoking status: Every Day    Current packs/day: 0.50    Types: Cigarettes   Smokeless tobacco: Never   Tobacco comments:    Recently started a smoking cessation class. Quit 03/31/23  Vaping Use   Vaping status: Never Used  Substance Use Topics   Alcohol use: Not Currently   Drug use: Not Currently    Types: Marijuana    Comment: none in about 1 year     Allergies   Dilaudid  [hydromorphone  hcl], Fentanyl , Zyprexa  [olanzapine ], Aspirin, Depakote [divalproex sodium], and Minocycline   Review of Systems Review of Systems  HENT:  Positive for congestion and sore throat.   Respiratory:  Positive for cough and shortness of breath.   Musculoskeletal:  Positive for myalgias.  Neurological:  Positive for headaches.     Physical Exam Triage Vital Signs ED Triage Vitals  Encounter Vitals Group     BP  07/02/24 1129 (!) 163/98     Girls Systolic BP Percentile --      Girls Diastolic BP Percentile --      Boys Systolic BP Percentile --      Boys Diastolic BP Percentile --      Pulse Rate 07/02/24 1129 100     Resp 07/02/24 1129 18     Temp 07/02/24 1129 99.2 F (37.3 C)     Temp Source 07/02/24 1129 Oral     SpO2 07/02/24 1129 98 %     Weight --      Height --      Head Circumference --      Peak Flow --      Pain Score 07/02/24 1127 7     Pain Loc --      Pain Education --      Exclude from Growth Chart --    No data found.  Updated Vital Signs BP (!) 163/98  Pulse 100   Temp 99.2 F (37.3 C) (Oral)   Resp 18   SpO2 98%   Visual Acuity Right Eye Distance:   Left Eye Distance:   Bilateral Distance:    Right Eye Near:   Left Eye Near:    Bilateral Near:     Physical Exam Vitals and nursing note reviewed.  Constitutional:      General: She is not in acute distress.    Appearance: She is well-developed. She is not ill-appearing.  HENT:     Head: Normocephalic and atraumatic.     Right Ear: Tympanic membrane and ear canal normal.     Left Ear: Tympanic membrane and ear canal normal.     Nose: Congestion present.     Mouth/Throat:     Mouth: Mucous membranes are moist.     Pharynx: Oropharynx is clear. Uvula midline. Posterior oropharyngeal erythema present.     Tonsils: No tonsillar exudate or tonsillar abscesses.  Eyes:     Conjunctiva/sclera: Conjunctivae normal.     Pupils: Pupils are equal, round, and reactive to light.  Cardiovascular:     Rate and Rhythm: Normal rate and regular rhythm.     Heart sounds: Normal heart sounds.  Pulmonary:     Effort: Pulmonary effort is normal.     Breath sounds: Normal breath sounds. No wheezing or rhonchi.  Musculoskeletal:     Cervical back: Normal range of motion and neck supple.  Lymphadenopathy:     Cervical: No cervical adenopathy.  Skin:    General: Skin is warm and dry.  Neurological:     General: No  focal deficit present.     Mental Status: She is alert and oriented to person, place, and time.  Psychiatric:        Mood and Affect: Mood normal.        Behavior: Behavior normal.      UC Treatments / Results  Labs (all labs ordered are listed, but only abnormal results are displayed) Labs Reviewed  POC COVID19/FLU A&B COMBO  POCT RAPID STREP A (OFFICE)    EKG   Radiology No results found.  Procedures Procedures (including critical care time)  Medications Ordered in UC Medications - No data to display  Initial Impression / Assessment and Plan / UC Course  I have reviewed the triage vital signs and the nursing notes.  Pertinent labs & imaging results that were available during my care of the patient were reviewed by me and considered in my medical decision making (see chart for details).     Reviewed exam and symptoms with patient.  Negative COVID flu and strep throat testing.  Wet read of chest x-ray without obvious consolidation, will contact for any positive results based on radiology overread once available.  Will treat for bronchitis with Tessalon  and prednisone .  Albuterol inhaler refilled per patient request.  Encouraged rest fluids and PCP follow-up in 2 to 3 days for recheck.  ER precautions reviewed. Final Clinical Impressions(s) / UC Diagnoses   Final diagnoses:  Acute cough  Sore throat  Viral illness  Acute bronchitis, unspecified organism     Discharge Instructions      Inhaler to useI have refilled your needed.  You may take Tessalon  3 times a day as needed for your cough.  Prednisone  daily for 5 days.  Lots of rest and fluids.  Please follow-up with your PCP in 2 to 3 days for recheck.  Please go to the ER for any  worsening symptoms.  I hope you feel better soon!     ED Prescriptions     Medication Sig Dispense Auth. Provider   albuterol (VENTOLIN HFA) 108 (90 Base) MCG/ACT inhaler Inhale 1-2 puffs into the lungs every 6 (six) hours as needed.  1 each Suren Payne, Jodi R, NP   benzonatate  (TESSALON ) 200 MG capsule Take 1 capsule (200 mg total) by mouth 3 (three) times daily as needed. 20 capsule Malvin Morrish, Jodi R, NP   predniSONE  (DELTASONE ) 20 MG tablet Take 2 tablets (40 mg total) by mouth daily with breakfast for 5 days. 10 tablet Pranav Lince, Jodi R, NP      PDMP not reviewed this encounter.   Loreda Myla SAUNDERS, NP 07/02/24 [phone]

## 2024-07-02 NOTE — Discharge Instructions (Addendum)
 Inhaler to useI have refilled your needed.  You may take Tessalon  3 times a day as needed for your cough.  Prednisone  daily for 5 days.  Lots of rest and fluids.  Please follow-up with your PCP in 2 to 3 days for recheck.  Please go to the ER for any worsening symptoms.  I hope you feel better soon!

## 2024-07-02 NOTE — ED Triage Notes (Signed)
 Pt c/o chest congestion, wheezing, bodyaches, HA, nasal drainage, sore throatx4d

## 2024-07-04 ENCOUNTER — Other Ambulatory Visit: Payer: Self-pay

## 2024-07-05 ENCOUNTER — Other Ambulatory Visit (HOSPITAL_COMMUNITY): Payer: Self-pay

## 2024-07-05 ENCOUNTER — Encounter (HOSPITAL_BASED_OUTPATIENT_CLINIC_OR_DEPARTMENT_OTHER): Payer: Self-pay | Admitting: Internal Medicine

## 2024-07-05 ENCOUNTER — Other Ambulatory Visit: Payer: Self-pay

## 2024-07-05 DIAGNOSIS — Z029 Encounter for administrative examinations, unspecified: Secondary | ICD-10-CM | POA: Diagnosis not present

## 2024-07-05 DIAGNOSIS — J209 Acute bronchitis, unspecified: Secondary | ICD-10-CM

## 2024-07-05 NOTE — Telephone Encounter (Signed)
Please see the MyChart message reply(ies) for my assessment and plan.    This patient gave consent for this Medical Advice Message and is aware that it may result in a bill to their insurance company, as well as the possibility of receiving a bill for a co-payment or deductible. They are an established patient, but are not seeking medical advice exclusively about a problem treated during an in person or video visit in the last seven days. I did not recommend an in person or video visit within seven days of my reply.    I spent a total of 5 minutes cumulative time within 7 days through MyChart messaging.  Mariem Skolnick, MD   

## 2024-07-06 ENCOUNTER — Other Ambulatory Visit (HOSPITAL_COMMUNITY): Payer: Self-pay

## 2024-07-08 ENCOUNTER — Other Ambulatory Visit (HOSPITAL_COMMUNITY): Payer: Self-pay

## 2024-07-08 ENCOUNTER — Other Ambulatory Visit: Payer: Self-pay

## 2024-07-11 ENCOUNTER — Ambulatory Visit (HOSPITAL_BASED_OUTPATIENT_CLINIC_OR_DEPARTMENT_OTHER): Admitting: Cardiology

## 2024-07-12 ENCOUNTER — Other Ambulatory Visit (HOSPITAL_BASED_OUTPATIENT_CLINIC_OR_DEPARTMENT_OTHER): Payer: Self-pay

## 2024-07-12 ENCOUNTER — Other Ambulatory Visit: Payer: Self-pay

## 2024-07-15 ENCOUNTER — Other Ambulatory Visit: Payer: Self-pay

## 2024-07-15 NOTE — Progress Notes (Deleted)
 Cardiology Office Note:    Date:  07/15/2024   ID:  Chakira, Jachim Jul 13, 1976, MRN [id number]  PCP:  Vicci Barnie NOVAK, MD  Cardiologist:  Lonni LITTIE Nanas, MD  Electrophysiologist:  None   Referring MD: Vicci Barnie NOVAK, MD   No chief complaint on file.   History of Present Illness:    Sandra Miller is a 48 y.o. female with a hx of BPD, breast cancer, Graves' disease, gastric ulcer, hypertension, former alcohol abuse, tobacco use who presents for follow-up.  She was referred by Dr. Vicci for evaluation of abnormal EKG.  She was admitted July 2024 after having witnessed seizures.  Labs notable for magnesium  0.8.  Neurology was consulted and she was started on Keppra .  Hypomagnesemia and hypokalemia were treated.  She was admitted again in August 2024 with dizziness, found to have prolonged QT interval.  Her Zyprexa  was discontinued and hypomagnesemia/hypokalemia were treated.  She was referred to nephrology for persistent hypomagnesemia and hypokalemia.    Echo 04/2023 EF 55-60%, normal RV function, no significiant valvular disease.  Zio patch x14 days 07/2023 showed no significant arrhythmias.  Calcium  score 0 on 11/2023.  Since last clinic visit,  she reports she is doing okay.  Does report some shortness of breath when laying down at night.  Has been having some dizziness but denies any syncope.  Continues to have palpitations, recently saw her endocrinologist and decreased her methimazole  dose.  She denies any chest pain or lower extremity edema.    Past Medical History:  Diagnosis Date   Allergy    Arthritis    Bipolar 1 disorder (HCC)    Cancer (HCC)    vulva, and breast   Chronic kidney disease    Clotting disorder    Complication of anesthesia    wakes up during procedures   Depression    GAD (generalized anxiety disorder)    Genital HSV    currently per pt  no break out 03-22-2016    GERD (gastroesophageal reflux disease)    Graves disease     Hiatal hernia    History of cervical dysplasia    2012 laser ablation   History of esophageal dilatation    for dysphasia -- x2 dilated   History of gastric ulcer    History of Helicobacter pylori infection    remote hx   History of hidradenitis suppurativa    gets all over body intermittantly     History of hypertension    no issue since stopped drinking alcohol 2014   History of kidney stones    History of panic attacks    History of radiation therapy 03/16/2020-05/08/2020   vulva  Dr Lynwood Nasuti   History of radiation therapy 09/09/2020-10/23/2020   left chest wall/left SCV   Dr Lynwood Nasuti   Hyperlipidemia    Hypertension    Iron deficiency anemia    Left ureteral stone    OCD (obsessive compulsive disorder)    PONV (postoperative nausea and vomiting)    Pre-diabetes    PTSD (post-traumatic stress disorder)    Recovering alcoholic in remission (HCC)    since 2014   RLS (restless legs syndrome)    Seizures (HCC)    Smokers' cough (HCC)    Thyroid  disease    Urgency of urination    Yeast infection involving the vagina and surrounding area    secondary to taking antibiotic    Past Surgical History:  Procedure Laterality Date   CESAREAN  SECTION  1995   w/  Bilateral Tubal Ligation   COLONOSCOPY  last one 08-09-2013   CYSTOSCOPY W/ URETERAL STENT PLACEMENT Left 03/29/2016   Procedure: CYSTOSCOPY WITH STENT REPLACEMENT;  Surgeon: Redell Lynwood Napoleon, MD;  Location: [facility];  Service: Urology;  Laterality: Left;   CYSTOSCOPY WITH RETROGRADE PYELOGRAM, URETEROSCOPY AND STENT PLACEMENT Left 03/08/2016   Procedure: CYSTOSCOPY WITH  LEFT RETROGRADE PYELOGRAM, AND STENT PLACEMENT;  Surgeon: Redell Lynwood Napoleon, MD;  Location: [facility];  Service: Urology;  Laterality: Left;   CYSTOSCOPY/RETROGRADE/URETEROSCOPY/STONE EXTRACTION WITH BASKET Left 03/29/2016   Procedure: CYSTOSCOPY/RETROGRADE/URETEROSCOPY/STONE EXTRACTION WITH BASKET;  Surgeon: Redell Lynwood Napoleon, MD;   Location: [facility];  Service: Urology;  Laterality: Left;   ENDOMETRIAL ABLATION W/ NOVASURE  04-01-2010   ESOPHAGOGASTRODUODENOSCOPY  last one 08-09-2013   KNEE ARTHROSCOPY Left as teen   LASER ABLATION OF THE CERVIX  2012 approx   MASTECTOMY WITH AXILLARY LYMPH NODE DISSECTION Left 07/27/2020   Procedure: LEFT MASTECTOMY WITH LEFT RADIOACTIVE SEED GUIDED TARGETED AXILLARY LYMPH NODE DISSECTION;  Surgeon: Ethyl Lenis, MD;  Location: [facility];  Service: General;  Laterality: Left;   PORT-A-CATH REMOVAL Right 05/18/2021   Procedure: REMOVAL PORT-A-CATH;  Surgeon: Belinda Cough, MD;  Location: [facility];  Service: General;  Laterality: Right;   PORTACATH PLACEMENT Right 03/06/2020   Procedure: INSERTION PORT-A-CATH WITH ULTRASOUND GUIDANCE;  Surgeon: Ethyl Lenis, MD;  Location: [facility];  Service: General;  Laterality: Right;   ROBOTIC ASSISTED TOTAL HYSTERECTOMY WITH BILATERAL SALPINGO OOPHERECTOMY N/A 05/18/2021   Procedure: XI ROBOTIC ASSISTED TOTAL HYSTERECTOMY WITH BILATERAL SALPINGO OOPHORECTOMY;  Surgeon: Eloy Herring, MD;  Location: [facility];  Service: Gynecology;  Laterality: N/A;   TRANSTHORACIC ECHOCARDIOGRAM  05-19-2006   lvsf normal, ef 55-65%, there was mild flattening of the interventricular septum during diastoli/  RV size at upper limits normal   TUBAL LIGATION     VULVA /PERINEUM BIOPSY N/A 05/18/2021   Procedure: VULVAR BIOPSY;  Surgeon: Eloy Herring, MD;  Location: [facility];  Service: Gynecology;  Laterality: N/A;   WISDOM TOOTH EXTRACTION  age 62 's    Current Medications: No outpatient medications have been marked as taking for the 07/19/24 encounter (Appointment) with Kate Lonni CROME, MD.     Allergies:   Dilaudid  [hydromorphone  hcl], Fentanyl , Zyprexa  [olanzapine ], Aspirin, Depakote [divalproex sodium], and Minocycline   Social History   Socioeconomic History   Marital status: Single    Spouse name: Not on file   Number of children:  Not on file   Years of education: Not on file   Highest education level: Associate degree: academic program  Occupational History   Not on file  Tobacco Use   Smoking status: Every Day    Current packs/day: 0.50    Types: Cigarettes   Smokeless tobacco: Never   Tobacco comments:    Recently started a smoking cessation class. Quit 03/31/23  Vaping Use   Vaping status: Never Used  Substance and Sexual Activity   Alcohol use: Not Currently   Drug use: Not Currently    Types: Marijuana    Comment: none in about 1 year   Sexual activity: Yes    Partners: Male    Birth control/protection: Surgical  Other Topics Concern   Not on file  Social History Narrative   Former [facility] patient.      Was on disability at one point.   Return to the workforce.  40 hours a week at [employer],  10 hours a week on the weekends at [employer] in [place], [employer] at night 10 to 12 hours a week.      Has grown children, she lives alone with a pet, continues to smoke no alcohol or drug use at this time      History of EtOH abuse and THC use.         Social Drivers of Corporate Investment Banker Strain: Low Risk  (05/07/2024)   Overall Financial Resource Strain (CARDIA)    Difficulty of Paying Living Expenses: Not hard at all  Food Insecurity: No Food Insecurity (05/07/2024)   Hunger Vital Sign    Worried About Running Out of Food in the Last Year: Never true    Ran Out of Food in the Last Year: Never true  Transportation Needs: No Transportation Needs (05/07/2024)   PRAPARE - Administrator, Civil Service (Medical): No    Lack of Transportation (Non-Medical): No  Physical Activity: Inactive (05/07/2024)   Exercise Vital Sign    Days of Exercise per Week: 0 days    Minutes of Exercise per Session: 0 min  Stress: Stress Concern Present (05/07/2024)   [employer] of Occupational Health - Occupational Stress Questionnaire    Feeling of Stress: Very much  Social  Connections: Socially Isolated (05/07/2024)   Social Connection and Isolation Panel    Frequency of Communication with Friends and Family: More than three times a week    Frequency of Social Gatherings with Friends and Family: Once a week    Attends Religious Services: Never    Database Administrator or Organizations: No    Attends Engineer, Structural: Never    Marital Status: Never married     Family History: The patient's family history includes ADD / ADHD in her brother; Alcohol abuse in her brother and father; Anxiety disorder in her maternal aunt and mother; Breast cancer in her cousin and cousin; Cancer in her cousin and paternal grandfather; Cirrhosis in her cousin; Colon polyps in her brother; Depression in her maternal aunt and mother; Diabetes in her maternal grandfather and paternal grandmother; Drug abuse in her brother and brother; Heart disease in her father and mother; Kidney disease in her maternal uncle; Lung cancer in her cousin and father; Lung cancer (age of onset: 46) in her paternal uncle; Throat cancer in her cousin. There is no history of Colon cancer, Esophageal cancer, Stomach cancer, or Rectal cancer.  ROS:   Please see the history of present illness.     All other systems reviewed and are negative.  EKGs/Labs/Other Studies Reviewed:    The following studies were reviewed today:   EKG:   07/06/2023: Normal sinus rhythm, rate 88, nonspecific T wave flattening, QTc 546 11/01/2023: Normal sinus rhythm, rate 94, QTc 460  Recent Labs: 11/01/2023: Magnesium  1.8 05/08/2024: ALT 15; BUN 14; Creatinine, Ser 0.83; Hemoglobin 12.9; Platelets 349; Potassium 4.6; Sodium 140; TSH 1.090  Recent Lipid Panel    Component Value Date/Time   CHOL 273 (H) 11/01/2023 1033   TRIG 145 11/01/2023 1033   HDL 70 11/01/2023 1033   CHOLHDL 3.9 11/01/2023 1033   CHOLHDL 4.7 Ratio 06/13/2008 2303   VLDL 32 06/13/2008 2303   LDLCALC 177 (H) 11/01/2023 1033    Physical Exam:     VS:  There were no vitals taken for this visit.    Wt Readings from Last 3 Encounters:  06/21/24 228 lb 6.4 oz (103.6 kg)  05/07/24 222 lb (100.7 kg)  01/19/24 220 lb (99.8 kg)     GEN:  Well nourished, well developed in no acute distress HEENT: Normal NECK: No JVD; No carotid bruits LYMPHATICS: No lymphadenopathy CARDIAC: RRR, no murmurs, rubs, gallops RESPIRATORY:  Clear to auscultation without rales, wheezing or rhonchi  ABDOMEN: Soft, non-tender, non-distended MUSCULOSKELETAL:  No edema; No deformity  SKIN: Warm and dry NEUROLOGIC:  Alert and oriented x 3 PSYCHIATRIC:  Normal affect   ASSESSMENT:    No diagnosis found.   PLAN:    Prolonged QT: During admission 04/2023 noted to have prolonged QTc, her Zyprexa  was discontinued.  She also had significant hypokalemia and hypomagnesemia, which were repleted, and she was referred to nephrology.  QTc improved to at clinic visit 05/2024.  Avoid QT prolonging agents  Palpitations: Zio patch x14 days 07/2023 showed no significant arrhythmias. -Suspect related to her thyroid  disease, reports recently decreased methimazole  dose***  Hypertension: On lisinopril  10 mg daily and spironolactone  12.5 mg daily and propranolol  20 mg twice daily.  Appears controlled.  Check BMET, magnesium ***  Hyperlipidemia: LDL 177 on 11/01/2023.  10-year ASCVD risk score 3%.  Calcium  score 0 on 11/2023.  Does not meet indication for statin at this time, dietary changes recommended***   RTC in 1 year***   Medication Adjustments/Labs and Tests Ordered: Current medicines are reviewed at length with the patient today.  Concerns regarding medicines are outlined above.  No orders of the defined types were placed in this encounter.  No orders of the defined types were placed in this encounter.   There are no Patient Instructions on file for this visit.   Signed, Lonni LITTIE Nanas, MD  07/15/2024 11:57 AM    [facility]

## 2024-07-17 ENCOUNTER — Encounter: Payer: Self-pay | Admitting: Hematology and Oncology

## 2024-07-17 ENCOUNTER — Other Ambulatory Visit: Payer: Self-pay

## 2024-07-17 ENCOUNTER — Encounter (HOSPITAL_COMMUNITY): Payer: Self-pay | Admitting: Psychiatry

## 2024-07-17 ENCOUNTER — Other Ambulatory Visit (HOSPITAL_COMMUNITY): Payer: Self-pay

## 2024-07-17 ENCOUNTER — Encounter (HOSPITAL_COMMUNITY): Payer: Self-pay

## 2024-07-17 ENCOUNTER — Telehealth (HOSPITAL_BASED_OUTPATIENT_CLINIC_OR_DEPARTMENT_OTHER): Admitting: Psychiatry

## 2024-07-17 VITALS — Wt 228.0 lb

## 2024-07-17 DIAGNOSIS — F319 Bipolar disorder, unspecified: Secondary | ICD-10-CM

## 2024-07-17 DIAGNOSIS — F410 Panic disorder [episodic paroxysmal anxiety] without agoraphobia: Secondary | ICD-10-CM

## 2024-07-17 DIAGNOSIS — F5101 Primary insomnia: Secondary | ICD-10-CM

## 2024-07-17 DIAGNOSIS — F411 Generalized anxiety disorder: Secondary | ICD-10-CM

## 2024-07-17 MED ORDER — PROPRANOLOL HCL 20 MG PO TABS
20.0000 mg | ORAL_TABLET | Freq: Two times a day (BID) | ORAL | 1 refills | Status: DC
  Filled 2024-07-17 – 2024-08-05 (×3): qty 60, 30d supply, fill #0
  Filled 2024-08-24 – 2024-09-12 (×3): qty 60, 30d supply, fill #1

## 2024-07-17 MED ORDER — LORAZEPAM 1 MG PO TABS
1.0000 mg | ORAL_TABLET | Freq: Three times a day (TID) | ORAL | 1 refills | Status: DC | PRN
  Filled 2024-07-17 – 2024-09-07 (×4): qty 90, 30d supply, fill #0

## 2024-07-17 MED ORDER — FLUOXETINE HCL 20 MG PO CAPS
20.0000 mg | ORAL_CAPSULE | Freq: Every day | ORAL | 1 refills | Status: DC
  Filled 2024-07-17 – 2024-08-05 (×3): qty 30, 30d supply, fill #0
  Filled 2024-08-24 – 2024-09-12 (×4): qty 30, 30d supply, fill #1

## 2024-07-17 MED ORDER — REXULTI 0.5 MG PO TABS
0.5000 mg | ORAL_TABLET | Freq: Two times a day (BID) | ORAL | 1 refills | Status: DC
  Filled 2024-07-17: qty 60, fill #0
  Filled 2024-07-17 – 2024-08-05 (×5): qty 60, 30d supply, fill #0
  Filled 2024-08-24: qty 60, 30d supply, fill #1

## 2024-07-17 NOTE — Progress Notes (Signed)
 [facility] MD Virtual Progress Note   Patient Location: In Car Provider Location: Home Office  I connect with patient by video and verified that I am speaking with correct person by using two identifiers. I discussed the limitations of evaluation and management by telemedicine and the availability of in person appointments. I also discussed with the patient that there may be a patient responsible charge related to this service. The patient expressed understanding and agreed to proceed.  Sandra Miller [id number] 48 y.o.  07/17/2024 8:37 AM  History of Present Illness:  Patient is evaluated by video session.  She reported not doing very well and feels more irritable, not happy with the work and is struggling with anxiety, poor sleep, headaches, increased blood pressure.  Patient reported was sick last week and could not go to work and have to take a time off.  Now she is working double shifts to cover loss of work and that is not helping her mental health.  She is getting easily irritable, angry, mood swings.  She gets very snappy.  When she comes home she is isolated withdrawn and does not communicate with people around.  Patient is working as a engineer, structural at [employer].  She is actively looking at other places and she also applied [place].  She is thinking to go to [employer] if stays on her current job.  She struggle with sleep.  She recently seen primary care and waiting for sleep study.  She was recommended to take the melatonin 3 mg which she is taking but it does not help sleep and make her groggy next day.  She denies any hallucination, paranoia but reported irritability.  She denies any active or passive suicidal thoughts or homicidal thoughts.  She reported her mood is everywhere and she feels very fatigued, tired.  She noticed her blood pressure is also fluctuating.  When she takes her blood pressure it is usually high and that gives her a panic attack.  She  has appointment coming up tomorrow to see the cardiology.  She now taking propranolol  20 mg twice a day that helps some of her symptoms.  She is also taking Ativan  1 mg 3 times a day, Rexulti  0.5 mg at evening, Prozac  20 mg daily.  She has no tremor or shakes.  Since increase the propranolol  her physiological symptoms of anxiety is somewhat better.  She was noticing sweating, calming hand in the past.  She denies any mania or any aggression.  She denies drinking or using any illegal substances.  Her appetite is fair.  Energy level is fair.  Her weight is unchanged from the past.   Past Psychiatric History: H/O bipolar disorder, alcohol dependence and marijuana abuse.  H/O at least 6 inpatient. Tried lithium, Seroquel, Lamictal , Paxil, Celexa, Abilify, Geodon , Klonopin, Depakote, Neurontin  and Cymbalta.  Trintellix  caused nausea. No H/o history of suicidal attempt.  Olanzapine  helped but it was discontinued in July 2024 due to abnormal EKG. Prozac  helped for while until stopped working.  Topamax  stopped due to inefficiency.   Past Medical History:  Diagnosis Date   Allergy    Arthritis    Bipolar 1 disorder (HCC)    Cancer (HCC)    vulva, and breast   Chronic kidney disease    Clotting disorder    Complication of anesthesia    wakes up during procedures   Depression    GAD (generalized anxiety disorder)    Genital HSV  currently per pt  no break out 03-22-2016    GERD (gastroesophageal reflux disease)    Graves disease    Hiatal hernia    History of cervical dysplasia    2012 laser ablation   History of esophageal dilatation    for dysphasia -- x2 dilated   History of gastric ulcer    History of Helicobacter pylori infection    remote hx   History of hidradenitis suppurativa    gets all over body intermittantly     History of hypertension    no issue since stopped drinking alcohol 2014   History of kidney stones    History of panic attacks    History of radiation therapy  03/16/2020-05/08/2020   vulva  Dr Lynwood Nasuti   History of radiation therapy 09/09/2020-10/23/2020   left chest wall/left SCV   Dr Lynwood Nasuti   Hyperlipidemia    Hypertension    Iron deficiency anemia    Left ureteral stone    OCD (obsessive compulsive disorder)    PONV (postoperative nausea and vomiting)    Pre-diabetes    PTSD (post-traumatic stress disorder)    Recovering alcoholic in remission (HCC)    since 2014   RLS (restless legs syndrome)    Seizures (HCC)    Smokers' cough (HCC)    Thyroid  disease    Urgency of urination    Yeast infection involving the vagina and surrounding area    secondary to taking antibiotic    Outpatient Encounter Medications as of 07/17/2024  Medication Sig   acetaminophen  (TYLENOL ) 500 MG tablet Take 500 mg by mouth every 6 (six) hours as needed.   albuterol (VENTOLIN HFA) 108 (90 Base) MCG/ACT inhaler Inhale 1-2 puffs into the lungs every 6 (six) hours as needed.   benzonatate  (TESSALON ) 200 MG capsule Take 1 capsule (200 mg total) by mouth 3 (three) times daily as needed.   Brexpiprazole  (REXULTI ) 0.5 MG TABS Take 1 tablet (0.5 mg total) by mouth daily.   famotidine  (PEPCID ) 40 MG tablet Take 1 tablet (40 mg total) by mouth daily.   FLUoxetine  (PROZAC ) 20 MG capsule Take 1 capsule (20 mg total) by mouth daily.   Fremanezumab -vfrm (AJOVY ) 225 MG/1.5ML SOAJ Inject 225 mg into the skin every 30 (thirty) days.   gabapentin  (NEURONTIN ) 300 MG capsule Take 1 capsule (300 mg) by mouth daily.   LamoTRIgine  (LAMICTAL  XR) 200 MG TB24 24 hour tablet Take 1 tablet (200 mg total) by mouth at bedtime.   letrozole  (FEMARA ) 2.5 MG tablet Take 1 tablet by mouth daily.   lisinopril  (ZESTRIL ) 10 MG tablet Take 1 tablet (10 mg total) by mouth daily.   loratadine (CLARITIN REDITABS) 10 MG dissolvable tablet Take 10 mg by mouth daily. OTC   LORazepam  (ATIVAN ) 1 MG tablet Take 1 tablet (1 mg total) by mouth 3 (three) times daily as needed for anxiety   Magnesium  400  MG TABS Take 800 mg by mouth 2 (two) times daily.   Multiple Vitamin (MULTIVITAMIN WITH MINERALS) TABS tablet Take 1 tablet by mouth daily.   omeprazole  (PRILOSEC) 40 MG capsule Take 1 capsule (40 mg total) by mouth daily before breakfast.   potassium chloride  SA (KLOR-CON  M) 20 MEQ tablet Take 1 tablet (20 mEq total) by mouth daily.   promethazine  (PHENERGAN ) 12.5 MG tablet Take 1 tablet (12.5 mg total) by mouth every 8 (eight) hours as needed for nausea or vomiting.   propranolol  (INDERAL ) 20 MG tablet Take 1 tablet (20 mg  total) by mouth 2 (two) times daily.   spironolactone  (ALDACTONE ) 25 MG tablet Take 0.5 tablets (12.5 mg total) by mouth daily.   Ubrogepant  (UBRELVY ) 50 MG TABS Take 50 mg as needed for migraine, a second dose may be taken at least 2 hours after the initial dose. DO NOT EXCEED 2 doses in 24 hours.   No facility-administered encounter medications on file as of 07/17/2024.    Recent Results (from the past 2160 hours)  TSH+T4F+T3Free     Status: None   Collection Time: 05/08/24  3:40 PM  Result Value Ref Range   TSH 1.090 0.450 - 4.500 uIU/mL   T3, Free 3.7 2.0 - 4.4 pg/mL   Free T4 1.33 0.82 - 1.77 ng/dL  Comprehensive metabolic panel with GFR     Status: Abnormal   Collection Time: 05/08/24  3:40 PM  Result Value Ref Range   Glucose 128 (H) 70 - 99 mg/dL   BUN 14 6 - 24 mg/dL   Creatinine, Ser 9.16 0.57 - 1.00 mg/dL   eGFR 86 >40 fO/fpw/8.26   BUN/Creatinine Ratio 17 9 - 23   Sodium 140 134 - 144 mmol/L   Potassium 4.6 3.5 - 5.2 mmol/L   Chloride 102 96 - 106 mmol/L   CO2 20 20 - 29 mmol/L   Calcium  10.2 8.7 - 10.2 mg/dL   Total Protein 7.3 6.0 - 8.5 g/dL   Albumin 4.7 3.9 - 4.9 g/dL   Globulin, Total 2.6 1.5 - 4.5 g/dL   Bilirubin Total 0.3 0.0 - 1.2 mg/dL   Alkaline Phosphatase 102 44 - 121 IU/L    Comment: **Effective May 20, 2024 Alkaline Phosphatase**   reference interval will be changing to:              Age                Female          Female            0 -  5 days         47 - 127       47 - 127           6 - 10 days         29 - 242       29 - 242          11 - 20 days        109 - 357      109 - 357          21 - 30 days         94 - 494       94 - 494           1 -  2 months      149 - 539      149 - 539           3 -  6 months      131 - 452      131 - 452           7 - 11 months      117 - 401      117 - 401   12 months -  6 years       158 - 369      158 - 369           7 - 12 years  150 - 409      150 - 409               13 years       156 - 435       78 - 227               14 years       114 - 375       64 - 161               15 years        88 - 279       56 - 134               16 years        74 - 207       51 - 121               17 years        63 - 161       47 - 113          18 - 20 years        51 - 125       42 - 106          21 - 50 years         47 - 123       41 - 116          51 - 80 years        49 - 135       51 - 125              >80 years        48 - 129       48 - 129    AST 20 0 - 40 IU/L   ALT 15 0 - 32 IU/L  CBC     Status: None   Collection Time: 05/08/24  3:40 PM  Result Value Ref Range   WBC 9.6 3.4 - 10.8 x10E3/uL   RBC 4.45 3.77 - 5.28 x10E6/uL   Hemoglobin 12.9 11.1 - 15.9 g/dL   Hematocrit 60.5 65.9 - 46.6 %   MCV 89 79 - 97 fL   MCH 29.0 26.6 - 33.0 pg   MCHC 32.7 31.5 - 35.7 g/dL   RDW 85.6 88.2 - 84.5 %   Platelets 349 150 - 450 x10E3/uL  Hemoglobin A1c     Status: Abnormal   Collection Time: 05/08/24  3:40 PM  Result Value Ref Range   Hgb A1c MFr Bld 6.1 (H) 4.8 - 5.6 %    Comment:          Prediabetes: 5.7 - 6.4          Diabetes: >6.4          Glycemic control for adults with diabetes: <7.0    Est. average glucose Bld gHb Est-mCnc 128 mg/dL  POC Rncpi80/Qol A&B Antigen     Status: None   Collection Time: 07/02/24 11:44 AM  Result Value Ref Range   Influenza A Antigen, POC Negative Negative   Influenza B Antigen, POC Negative Negative   Covid Antigen, POC Negative  Negative  POCT rapid strep A     Status: None   Collection Time: 07/02/24 11:44 AM  Result Value Ref Range   Rapid Strep A Screen Negative Negative     Psychiatric Specialty Exam: Physical  Exam  Review of Systems  Neurological:  Positive for headaches.  Psychiatric/Behavioral:  Positive for sleep disturbance. The patient is nervous/anxious.     Weight 228 lb (103.4 kg).There is no height or weight on file to calculate BMI.  General Appearance: Casual  Eye Contact:  Fair  Speech:  Slow  Volume:  Decreased  Mood:  Irritable  Affect:  Constricted and Depressed  Thought Process:  Goal Directed  Orientation:  Full (Time, Place, and Person)  Thought Content:  Rumination  Suicidal Thoughts:  No  Homicidal Thoughts:  No  Memory:  Immediate;   Good Recent;   Good Remote;   Fair  Judgement:  Intact  Insight:  Present  Psychomotor Activity:  Decreased  Concentration:  Concentration: Fair and Attention Span: Fair  Recall:  Fair  Fund of Knowledge:  Good  Language:  Good  Akathisia:  No  Handed:  Right  AIMS (if indicated):     Assets:  Communication Skills Desire for Improvement Housing Social Support Talents/Skills Transportation  ADL's:  Intact  Cognition:  WNL  Sleep:  fair       05/07/2024    3:47 PM 12/22/2023   11:09 AM 10/02/2023    2:47 PM 09/25/2023    9:52 AM 08/01/2023    3:53 PM  Depression screen PHQ 2/9  Decreased Interest 3 1 3 3 3   Down, Depressed, Hopeless 2 1 3 3 3   PHQ - 2 Score 5 2 6 6 6   Altered sleeping 3 1 3 3 3   Tired, decreased energy 3 1 3 3 3   Change in appetite 3 0 3 3 3   Feeling bad or failure about yourself  3 0 3 3 3   Trouble concentrating 3 0 3 3 3   Moving slowly or fidgety/restless 1 0 0 3 0  Suicidal thoughts 0 0 1 3 0  PHQ-9 Score 21  4  22  27  21    Difficult doing work/chores Very difficult Not difficult at all Extremely dIfficult Extremely dIfficult Extremely dIfficult     Data saved with a previous flowsheet row definition     Assessment/Plan: Bipolar 1 disorder (HCC) - Plan: Brexpiprazole  (REXULTI ) 0.5 MG TABS, FLUoxetine  (PROZAC ) 20 MG capsule, propranolol  (INDERAL ) 20 MG tablet  GAD (generalized anxiety disorder) - Plan: FLUoxetine  (PROZAC ) 20 MG capsule, LORazepam  (ATIVAN ) 1 MG tablet, propranolol  (INDERAL ) 20 MG tablet  Primary insomnia - Plan: FLUoxetine  (PROZAC ) 20 MG capsule, LORazepam  (ATIVAN ) 1 MG tablet  Panic attacks - Plan: propranolol  (INDERAL ) 20 MG tablet  Patient is 48 year old employed female with history of hypertension, GERD, seizures, Graves' disease, migraine, hypothyroidism, prolonged QT interval, history of vulvar cancer, bipolar disorder, panic attack, insomnia and generalized anxiety disorder.  I reviewed notes from recent visit to the urgent care and primary care.  She is feeling better after taking inhaler and cough medicine.  She continues to struggle with severe anxiety irritability and mood swings.  The contributor factor is current job and now she is working double shift because she took time off last week because she was feeling sick.  We discussed mood symptoms, insomnia and panic attacks.  She is taking propranolol  20 mg twice a day however I recommend should consult with cardiology if they can try a different medication that can help her blood pressure because every time she check her blood pressure it is high and it gives her panic attack.  I also encouraged to remind her primary care to get sleep study.  I  will try Rexulti  increasing to take 0.5 mg twice a day.  Currently she is taking only at evening.  Will keep the Ativan  1 mg 3 times a day and Prozac  20 mg daily.  Discussed sleep hygiene and recommend avoiding caffeinated beverages for going to bedtime.  Given the history of prolonged QT interval I also suggest that she should consult and consider taking blood pressure medication from cardiology.  We talk about not doing double shift at this moment because it is giving a lot of  stress and causing anxiety.  She will need a documentation that she cannot work double shift and I am happy to provide that.  I will follow-up in 2 months.  Encouraged to call back if she has any question, concern or if she feels worsening of symptoms.  Discussed safety concerns and anytime having active suicidal thoughts or homicidal thoughts and she need to call 911 or go to local emergency room.  Follow Up Instructions:     I discussed the assessment and treatment plan with the patient. The patient was provided an opportunity to ask questions and all were answered. The patient agreed with the plan and demonstrated an understanding of the instructions.   The patient was advised to call back or seek an in-person evaluation if the symptoms worsen or if the condition fails to improve as anticipated.    Collaboration of Care: Other provider involved in patient's care AEB notes are available in epic to review.  I will also forward my note to PCP and cardiology.  Patient/Guardian was advised Release of Information must be obtained prior to any record release in order to collaborate their care with an outside provider. Patient/Guardian was advised if they have not already done so to contact the registration department to sign all necessary forms in order for us  to release information regarding their care.   Consent: Patient/Guardian gives verbal consent for treatment and assignment of benefits for services provided during this visit. Patient/Guardian expressed understanding and agreed to proceed.     Total encounter time 28 minutes which includes face-to-face time, chart reviewed, care coordination, order entry and documentation during this encounter.   Note: This document was prepared by [employer] voice dictation technology and any errors that results from this process are unintentional.    Leni ONEIDA Client, MD 07/17/2024

## 2024-07-19 ENCOUNTER — Ambulatory Visit: Admitting: Cardiology

## 2024-07-22 ENCOUNTER — Other Ambulatory Visit (HOSPITAL_COMMUNITY): Payer: Self-pay

## 2024-07-22 ENCOUNTER — Other Ambulatory Visit: Payer: Self-pay

## 2024-07-22 ENCOUNTER — Ambulatory Visit: Attending: Internal Medicine | Admitting: Internal Medicine

## 2024-07-22 ENCOUNTER — Encounter: Payer: Self-pay | Admitting: Internal Medicine

## 2024-07-22 DIAGNOSIS — L509 Urticaria, unspecified: Secondary | ICD-10-CM

## 2024-07-22 MED ORDER — PREDNISONE 20 MG PO TABS
20.0000 mg | ORAL_TABLET | Freq: Every day | ORAL | 0 refills | Status: DC
  Filled 2024-07-22: qty 5, 5d supply, fill #0

## 2024-07-22 NOTE — Progress Notes (Signed)
 Virtual Visit via Video Note  I connected with Sandra Miller on 07/22/2024 at 4:52 PM by a video enabled telemedicine application and verified that I am speaking with the correct person using two identifiers.  Location: Patient: home Provider: Office   I discussed the limitations of evaluation and management by telemedicine and the availability of in person appointments. The patient expressed understanding and agreed to proceed.  History of Present Illness: Patient with history of HTN, HL, preDM, hypokalemia,  obesity, tob dep, RLS, DVT of the RT IJV and surrounding IJ port 04/2020 GERD/functional dyspepsia (on gabapentin  and  omeprazole ), hidradenitis suppurativa, breast CA left dx 02/2020 (ERP s/p left mastectomy, neoadjuvant chemotherapy and adjuvant XRT; Dr. Gudena).  Vulvar cancer (dx 01/2020), HSV infection, genitial warts, bipolar disorder/dep/anx (Dr. Curry), has applied for disability, Hyperthyroid   Discussed the use of AI scribe software for clinical note transcription with the patient, who gave verbal consent to proceed.  History of Present Illness Sandra Miller is a 48 year old female who presents with hives and itching.  She experienced the onset of hives last night, initially appearing on her stomach, back, and arms. She associates this with the use of a new concealer product from [employer], which she applied to her hand to test the color match. A few hours later, she began experiencing itching and noticed the hives.  She has a history of allergic reactions, including a previous episode three months ago when her neck swelled and she broke out in hives due to hair braids. During that episode, she was treated at urgent care [facility] where she was dischg with  an EpiPen , albuterol inhaler, and prednisone  due to difficulty breathing and neck swelling.  She has taken Benadryl  for the current hives but dislikes its effects, particularly as it exacerbates her increased heart rate. She is  currently taking Pepcid  40 mg at bedtime and has no new medications. There have been no changes in her diet, body products, or environment, and she has not introduced any new animals into her home. No recent changes in soaps, lotions, laundry detergents, or foods, and she has not been exposed to any new medications.    Outpatient Encounter Medications as of 07/22/2024  Medication Sig   acetaminophen  (TYLENOL ) 500 MG tablet Take 500 mg by mouth every 6 (six) hours as needed.   albuterol (VENTOLIN HFA) 108 (90 Base) MCG/ACT inhaler Inhale 1-2 puffs into the lungs every 6 (six) hours as needed.   benzonatate  (TESSALON ) 200 MG capsule Take 1 capsule (200 mg total) by mouth 3 (three) times daily as needed.   Brexpiprazole  (REXULTI ) 0.5 MG TABS Take 1 tablet (0.5 mg total) by mouth 2 (two) times daily.   famotidine  (PEPCID ) 40 MG tablet Take 1 tablet (40 mg total) by mouth daily.   FLUoxetine  (PROZAC ) 20 MG capsule Take 1 capsule (20 mg total) by mouth daily.   Fremanezumab -vfrm (AJOVY ) 225 MG/1.5ML SOAJ Inject 225 mg into the skin every 30 (thirty) days.   gabapentin  (NEURONTIN ) 300 MG capsule Take 1 capsule (300 mg) by mouth daily.   LamoTRIgine  (LAMICTAL  XR) 200 MG TB24 24 hour tablet Take 1 tablet (200 mg total) by mouth at bedtime.   letrozole  (FEMARA ) 2.5 MG tablet Take 1 tablet by mouth daily.   lisinopril  (ZESTRIL ) 10 MG tablet Take 1 tablet (10 mg total) by mouth daily.   loratadine (CLARITIN REDITABS) 10 MG dissolvable tablet Take 10 mg by mouth daily. OTC   LORazepam  (ATIVAN ) 1 MG  tablet Take 1 tablet (1 mg total) by mouth 3 (three) times daily as needed for anxiety   Magnesium  400 MG TABS Take 800 mg by mouth 2 (two) times daily.   Multiple Vitamin (MULTIVITAMIN WITH MINERALS) TABS tablet Take 1 tablet by mouth daily.   omeprazole  (PRILOSEC) 40 MG capsule Take 1 capsule (40 mg total) by mouth daily before breakfast.   potassium chloride  SA (KLOR-CON  M) 20 MEQ tablet Take 1 tablet (20 mEq  total) by mouth daily.   promethazine  (PHENERGAN ) 12.5 MG tablet Take 1 tablet (12.5 mg total) by mouth every 8 (eight) hours as needed for nausea or vomiting.   propranolol  (INDERAL ) 20 MG tablet Take 1 tablet (20 mg total) by mouth 2 (two) times daily.   spironolactone  (ALDACTONE ) 25 MG tablet Take 0.5 tablets (12.5 mg total) by mouth daily.   Ubrogepant  (UBRELVY ) 50 MG TABS Take 50 mg as needed for migraine, a second dose may be taken at least 2 hours after the initial dose. DO NOT EXCEED 2 doses in 24 hours.   No facility-administered encounter medications on file as of 07/22/2024.      Observations/Objective: Middle-age African-American female lying down in NAD  Assessment and Plan: 1. Hives (Primary) Previous episode due to allergy to hair braid.  The cause of this episode is unknown. - Prescribed prednisone  20 mg for 5 days. - Recommended loratadine as an alternative to Benadryl . - Referred to allergist for further evaluation. - Instructed to use EpiPen  and call 911 if difficulty breathing or tongue swelling occurs. - Ambulatory referral to Allergy - predniSONE  (DELTASONE ) 20 MG tablet; Take 1 tablet (20 mg total) by mouth daily with breakfast.  Dispense: 5 tablet; Refill: 0   Follow Up Instructions: PRN   I discussed the assessment and treatment plan with the patient. The patient was provided an opportunity to ask questions and all were answered. The patient agreed with the plan and demonstrated an understanding of the instructions.   The patient was advised to call back or seek an in-person evaluation if the symptoms worsen or if the condition fails to improve as anticipated.  I spent 10 minutes dedicated to the care of this patient on the date of this encounter to include previsit review of my last office note, face-to-face time with patient discussing diagnosis and management and post visit entering of orders.  This note has been created with Furniture conservator/restorer. Any transcriptional errors are unintentional.  Barnie Louder, MD

## 2024-07-23 ENCOUNTER — Ambulatory Visit: Admitting: Internal Medicine

## 2024-07-24 ENCOUNTER — Other Ambulatory Visit (HOSPITAL_COMMUNITY): Payer: Self-pay

## 2024-07-24 ENCOUNTER — Other Ambulatory Visit: Payer: Self-pay | Admitting: Physician Assistant

## 2024-07-24 ENCOUNTER — Other Ambulatory Visit: Payer: Self-pay

## 2024-07-24 DIAGNOSIS — R1013 Epigastric pain: Secondary | ICD-10-CM

## 2024-07-24 MED ORDER — GABAPENTIN 300 MG PO CAPS
300.0000 mg | ORAL_CAPSULE | Freq: Every day | ORAL | 1 refills | Status: DC
  Filled 2024-07-24 – 2024-07-30 (×4): qty 30, 30d supply, fill #0
  Filled 2024-08-24 – 2024-09-07 (×3): qty 30, 30d supply, fill #1

## 2024-07-26 ENCOUNTER — Other Ambulatory Visit: Payer: Self-pay

## 2024-07-26 ENCOUNTER — Other Ambulatory Visit (HOSPITAL_COMMUNITY): Payer: Self-pay

## 2024-07-26 ENCOUNTER — Other Ambulatory Visit (HOSPITAL_BASED_OUTPATIENT_CLINIC_OR_DEPARTMENT_OTHER): Payer: Self-pay

## 2024-07-30 ENCOUNTER — Other Ambulatory Visit (HOSPITAL_COMMUNITY): Payer: Self-pay

## 2024-07-30 ENCOUNTER — Other Ambulatory Visit: Payer: Self-pay

## 2024-07-31 ENCOUNTER — Other Ambulatory Visit: Payer: Self-pay

## 2024-07-31 ENCOUNTER — Other Ambulatory Visit (HOSPITAL_COMMUNITY): Payer: Self-pay

## 2024-08-02 ENCOUNTER — Telehealth (HOSPITAL_COMMUNITY): Payer: Self-pay

## 2024-08-02 ENCOUNTER — Other Ambulatory Visit (HOSPITAL_COMMUNITY): Payer: Self-pay

## 2024-08-02 NOTE — Telephone Encounter (Signed)
 PA request has been Received. New Encounter has been or will be created for follow up. For additional info see Pharmacy Prior Auth telephone encounter from 08/02/24.

## 2024-08-02 NOTE — Telephone Encounter (Signed)
 Pharmacy Patient Advocate Encounter   Received notification from Pt Calls Messages that prior authorization for Rexulti  0.5 mg tablets is required/requested.   Insurance verification completed.   The patient is insured through [facility].   Per test claim: PA required; PA submitted to above mentioned insurance via Latent Key/confirmation #/EOC [id number] Status is pending

## 2024-08-03 ENCOUNTER — Other Ambulatory Visit (HOSPITAL_COMMUNITY): Payer: Self-pay

## 2024-08-05 ENCOUNTER — Other Ambulatory Visit: Payer: Self-pay

## 2024-08-05 ENCOUNTER — Other Ambulatory Visit (HOSPITAL_COMMUNITY): Payer: Self-pay

## 2024-08-05 ENCOUNTER — Telehealth (HOSPITAL_COMMUNITY): Payer: Self-pay

## 2024-08-05 NOTE — Telephone Encounter (Signed)
 Pharmacy Patient Advocate Encounter  Received notification from [facility] that Prior Authorization for  Rexulti  0.5 mg tablets  has been APPROVED from 08/02/24 to 08/02/25. Ran test claim, Copay is $0. This test claim was processed through [facility]- copay amounts may vary at other pharmacies due to pharmacy/plan contracts, or as the patient moves through the different stages of their insurance plan.   PA #/Case ID/Reference #: [id number]

## 2024-08-06 ENCOUNTER — Ambulatory Visit: Attending: Internal Medicine | Admitting: Internal Medicine

## 2024-08-06 ENCOUNTER — Other Ambulatory Visit: Payer: Self-pay

## 2024-08-06 ENCOUNTER — Other Ambulatory Visit (HOSPITAL_COMMUNITY): Payer: Self-pay

## 2024-08-06 ENCOUNTER — Encounter: Payer: Self-pay | Admitting: Internal Medicine

## 2024-08-06 VITALS — BP 122/80 | HR 105 | Temp 98.5°F | Ht 66.0 in | Wt 235.0 lb

## 2024-08-06 DIAGNOSIS — Z713 Dietary counseling and surveillance: Secondary | ICD-10-CM | POA: Diagnosis not present

## 2024-08-06 DIAGNOSIS — R0683 Snoring: Secondary | ICD-10-CM

## 2024-08-06 DIAGNOSIS — I10 Essential (primary) hypertension: Secondary | ICD-10-CM

## 2024-08-06 DIAGNOSIS — R5383 Other fatigue: Secondary | ICD-10-CM

## 2024-08-06 DIAGNOSIS — F331 Major depressive disorder, recurrent, moderate: Secondary | ICD-10-CM

## 2024-08-06 DIAGNOSIS — R7303 Prediabetes: Secondary | ICD-10-CM

## 2024-08-06 DIAGNOSIS — Z6837 Body mass index (BMI) 37.0-37.9, adult: Secondary | ICD-10-CM

## 2024-08-06 DIAGNOSIS — N3946 Mixed incontinence: Secondary | ICD-10-CM | POA: Diagnosis not present

## 2024-08-06 MED ORDER — SOLIFENACIN SUCCINATE 5 MG PO TABS
5.0000 mg | ORAL_TABLET | Freq: Every day | ORAL | 1 refills | Status: AC
  Filled 2024-08-06: qty 30, 30d supply, fill #0

## 2024-08-06 NOTE — Progress Notes (Unsigned)
 Patient ID: Sandra Miller, female    DOB: [birth year]  MRN: [id number]  CC: Hypertension (HTN f/u. Sharlette fatigue /Difficulty holding in urine /Discuss weight loss options /Already received flu vax)   Subjective: Sandra Miller is a 48 y.o. female who presents for chronic ds management. Her concerns today include:  Patient with history of HTN, HL, preDM, hypokalemia,  obesity, tob dep, RLS, DVT of the RT IJV and surrounding IJ port 04/2020 GERD/functional dyspepsia (on gabapentin  and  omeprazole ), hidradenitis suppurativa, breast CA left dx 02/2020 (ERP s/p left mastectomy, neoadjuvant chemotherapy and adjuvant XRT; Dr. Gudena).  Vulvar cancer (dx 01/2020), HSV infection, genitial warts, bipolar disorder/dep/anx (Dr. Curry), has applied for disability, Hyperthyroid   Discussed the use of AI scribe software for clinical note transcription with the patient, who gave verbal consent to proceed.  History of Present Illness     Patient Active Problem List   Diagnosis Date Noted   Chronic migraine without aura, with intractable migraine, so stated, with status migrainosus 08/22/2023   Mood disorder 05/29/2023   Fatigue 05/15/2023   Prolonged QT interval 04/10/2023   Dizziness 04/10/2023   Provoked seizures (HCC) 03/31/2023   Respiratory depression 03/31/2023   Status epilepticus (HCC) 03/31/2023   Migraine with aura and without status migrainosus, not intractable 10/03/2022   Pain in right knee 09/07/2022   Bilateral primary osteoarthritis of knee 09/07/2022   Chronic migraine without aura without status migrainosus, not intractable 07/07/2022   Graves disease 05/10/2022   Sebaceous cyst 12/06/2021   Pain in left knee 11/03/2021   Hyperthyroidism 08/13/2021   Bipolar 1 disorder (HCC) 05/07/2021   Obesity (BMI 30.0-34.9) 05/07/2021   Hyperlipidemia 08/12/2020   Prediabetes 08/12/2020   Hypomagnesemia 08/12/2020   Mutation in BRIP1 gene 06/24/2020   Hypokalemia 06/17/2020    Dehydration    Colitis 06/16/2020   Port-A-Cath in place 03/11/2020   Malignant neoplasm of upper-inner quadrant of left breast in female, estrogen receptor positive (HCC) 02/25/2020   Vulvar cancer (HCC) 01/29/2020   Vulvar ulceration 01/23/2020   Essential hypertension 10/31/2019   Nausea and vomiting 12/27/2018   Seasonal allergic rhinitis 12/27/2018   History of ELISA positive for HSV 10/01/2018   Restless leg syndrome 04/01/2016   Nephrolithiasis 03/08/2016   Obstructive pyelonephritis 03/08/2016   Normocytic anemia 03/08/2016   Hidradenitis suppurativa of left axilla 11/03/2015   Loss of weight 09/08/2015   Neck pain on left side 10/22/2014   Genital warts 05/20/2014   GERD (gastroesophageal reflux disease) 12/26/2012   Functional dyspepsia 12/26/2012   Tobacco abuse 08/22/2012   Herpes simplex virus (HSV) infection 02/03/2007     Current Outpatient Medications on File Prior to Visit  Medication Sig Dispense Refill   acetaminophen  (TYLENOL ) 500 MG tablet Take 500 mg by mouth every 6 (six) hours as needed.     albuterol  (VENTOLIN  HFA) 108 (90 Base) MCG/ACT inhaler Inhale 1-2 puffs into the lungs every 6 (six) hours as needed. 1 each 0   Brexpiprazole  (REXULTI ) 0.5 MG TABS Take 1 tablet (0.5 mg total) by mouth 2 (two) times daily. 60 tablet 1   famotidine  (PEPCID ) 40 MG tablet Take 1 tablet (40 mg total) by mouth daily. 90 tablet 3   FLUoxetine  (PROZAC ) 20 MG capsule Take 1 capsule (20 mg total) by mouth daily. 30 capsule 1   Fremanezumab -vfrm (AJOVY ) 225 MG/1.5ML SOAJ Inject 225 mg into the skin every 30 (thirty) days. 1.5 mL 11   gabapentin  (NEURONTIN ) 300 MG capsule Take  1 capsule (300 mg) by mouth daily. 30 capsule 1   LamoTRIgine  (LAMICTAL  XR) 200 MG TB24 24 hour tablet Take 1 tablet (200 mg total) by mouth at bedtime. 90 tablet 4   letrozole  (FEMARA ) 2.5 MG tablet Take 1 tablet by mouth daily. 90 tablet 3   lisinopril  (ZESTRIL ) 10 MG tablet Take 1 tablet (10 mg total) by  mouth daily. 90 tablet 1   loratadine (CLARITIN REDITABS) 10 MG dissolvable tablet Take 10 mg by mouth daily. OTC     LORazepam  (ATIVAN ) 1 MG tablet Take 1 tablet (1 mg total) by mouth 3 (three) times daily as needed for anxiety 90 tablet 1   Magnesium  400 MG TABS Take 800 mg by mouth 2 (two) times daily. 120 tablet 1   Multiple Vitamin (MULTIVITAMIN WITH MINERALS) TABS tablet Take 1 tablet by mouth daily.     omeprazole  (PRILOSEC) 40 MG capsule Take 1 capsule (40 mg total) by mouth daily before breakfast. 90 capsule 3   potassium chloride  SA (KLOR-CON  M) 20 MEQ tablet Take 1 tablet (20 mEq total) by mouth daily. 90 tablet 1   predniSONE  (DELTASONE ) 20 MG tablet Take 1 tablet (20 mg total) by mouth daily with breakfast. 5 tablet 0   promethazine  (PHENERGAN ) 12.5 MG tablet Take 1 tablet (12.5 mg total) by mouth every 8 (eight) hours as needed for nausea or vomiting. 30 tablet 0   propranolol  (INDERAL ) 20 MG tablet Take 1 tablet (20 mg total) by mouth 2 (two) times daily. 60 tablet 1   spironolactone  (ALDACTONE ) 25 MG tablet Take 0.5 tablets (12.5 mg total) by mouth daily. 45 tablet 1   Ubrogepant  (UBRELVY ) 50 MG TABS Take 50 mg as needed for migraine, a second dose may be taken at least 2 hours after the initial dose. DO NOT EXCEED 2 doses in 24 hours. 10 tablet 1   benzonatate  (TESSALON ) 200 MG capsule Take 1 capsule (200 mg total) by mouth 3 (three) times daily as needed. (Patient not taking: Reported on 08/06/2024) 20 capsule 0   No current facility-administered medications on file prior to visit.    Allergies  Allergen Reactions   Dilaudid  [Hydromorphone  Hcl] Other (See Comments)    Pt became confused, pulled out iv's, does not remember anything   Fentanyl      Combative, delusional    Zyprexa  [Olanzapine ] Other (See Comments)    QT prolongation   Aspirin Hives    States able to tolerate Goody Powders and Ibuprofen  without any problem    Depakote [Divalproex Sodium] Nausea And Vomiting    Minocycline Hives    Social History   Socioeconomic History   Marital status: Single    Spouse name: Not on file   Number of children: Not on file   Years of education: Not on file   Highest education level: Associate degree: academic program  Occupational History   Not on file  Tobacco Use   Smoking status: Every Day    Current packs/day: 0.50    Types: Cigarettes   Smokeless tobacco: Never   Tobacco comments:    Recently started a smoking cessation class. Quit 03/31/23  Vaping Use   Vaping status: Never Used  Substance and Sexual Activity   Alcohol use: Not Currently   Drug use: Not Currently    Types: Marijuana    Comment: none in about 1 year   Sexual activity: Yes    Partners: Male    Birth control/protection: Surgical  Other Topics Concern  Not on file  Social History Narrative   Former [facility] patient.      Was on disability at one point.   Return to the workforce.  40 hours a week at [employer], 10 hours a week on the weekends at [employer] [employer] [employer] in [place], [employer] at night 10 to 12 hours a week.      Has grown children, she lives alone with a pet, continues to smoke no alcohol or drug use at this time      History of EtOH abuse and THC use.         Social Drivers of Corporate Investment Banker Strain: Low Risk  (05/07/2024)   Overall Financial Resource Strain (CARDIA)    Difficulty of Paying Living Expenses: Not hard at all  Food Insecurity: No Food Insecurity (05/07/2024)   Hunger Vital Sign    Worried About Running Out of Food in the Last Year: Never true    Ran Out of Food in the Last Year: Never true  Transportation Needs: No Transportation Needs (05/07/2024)   PRAPARE - Administrator, Civil Service (Medical): No    Lack of Transportation (Non-Medical): No  Physical Activity: Inactive (05/07/2024)   Exercise Vital Sign    Days of Exercise per Week: 0 days    Minutes of Exercise per Session: 0 min  Stress: Stress Concern Present  (05/07/2024)   [employer] of Occupational Health - Occupational Stress Questionnaire    Feeling of Stress: Very much  Social Connections: Socially Isolated (05/07/2024)   Social Connection and Isolation Panel    Frequency of Communication with Friends and Family: More than three times a week    Frequency of Social Gatherings with Friends and Family: Once a week    Attends Religious Services: Never    Database Administrator or Organizations: No    Attends Banker Meetings: Never    Marital Status: Never married  Intimate Partner Violence: Not At Risk (05/07/2024)   Humiliation, Afraid, Rape, and Kick questionnaire    Fear of Current or Ex-Partner: No    Emotionally Abused: No    Physically Abused: No    Sexually Abused: No    Family History  Problem Relation Age of Onset   Heart disease Mother    Depression Mother    Anxiety disorder Mother    Heart disease Father    Lung cancer Father        d. 54   Alcohol abuse Father    Drug abuse Brother    Alcohol abuse Brother    Drug abuse Brother    ADD / ADHD Brother    Colon polyps Brother    Anxiety disorder Maternal Aunt    Depression Maternal Aunt    Kidney disease Maternal Uncle    Lung cancer Paternal Uncle 27   Diabetes Maternal Grandfather    Diabetes Paternal Grandmother    Cancer Paternal Grandfather        stomach   Cirrhosis Cousin        alcoholic   Cancer Cousin        maternal; ovarian cancer or other female cancer?   Throat cancer Cousin        paternal; dx 53s   Lung cancer Cousin        paternal; dx 69s   Breast cancer Cousin        paternal   Breast cancer Cousin  paternal   Colon cancer Neg Hx    Esophageal cancer Neg Hx    Stomach cancer Neg Hx    Rectal cancer Neg Hx     Past Surgical History:  Procedure Laterality Date   CESAREAN SECTION  1995   w/  Bilateral Tubal Ligation   COLONOSCOPY  last one 08-09-2013   CYSTOSCOPY W/ URETERAL STENT PLACEMENT Left 03/29/2016    Procedure: CYSTOSCOPY WITH STENT REPLACEMENT;  Surgeon: Redell Lynwood Napoleon, MD;  Location: [facility];  Service: Urology;  Laterality: Left;   CYSTOSCOPY WITH RETROGRADE PYELOGRAM, URETEROSCOPY AND STENT PLACEMENT Left 03/08/2016   Procedure: CYSTOSCOPY WITH  LEFT RETROGRADE PYELOGRAM, AND STENT PLACEMENT;  Surgeon: Redell Lynwood Napoleon, MD;  Location: [facility];  Service: Urology;  Laterality: Left;   CYSTOSCOPY/RETROGRADE/URETEROSCOPY/STONE EXTRACTION WITH BASKET Left 03/29/2016   Procedure: CYSTOSCOPY/RETROGRADE/URETEROSCOPY/STONE EXTRACTION WITH BASKET;  Surgeon: Redell Lynwood Napoleon, MD;  Location[facility];  Service: Urology;  Laterality: Left;   ENDOMETRIAL ABLATION W/ NOVASURE  04-01-2010   ESOPHAGOGASTRODUODENOSCOPY  last one 08-09-2013   KNEE ARTHROSCOPY Left as teen   LASER ABLATION OF THE CERVIX  2012 approx   MASTECTOMY WITH AXILLARY LYMPH NODE DISSECTION Left 07/27/2020   Procedure: LEFT MASTECTOMY WITH LEFT RADIOACTIVE SEED GUIDED TARGETED AXILLARY LYMPH NODE DISSECTION;  Surgeon: Ethyl Lenis, MD;  Location: [facility];  Service: General;  Laterality: Left;   PORT-A-CATH REMOVAL Right 05/18/2021   Procedure: REMOVAL PORT-A-CATH;  Surgeon: Belinda Cough, MD;  Location: [facility];  Service: General;  Laterality: Right;   PORTACATH PLACEMENT Right 03/06/2020   Procedure: INSERTION PORT-A-CATH WITH ULTRASOUND GUIDANCE;  Surgeon: Ethyl Lenis, MD;  Location: [facility];  Service: General;  Laterality: Right;   ROBOTIC ASSISTED TOTAL HYSTERECTOMY WITH BILATERAL SALPINGO OOPHERECTOMY N/A 05/18/2021   Procedure: XI ROBOTIC ASSISTED TOTAL HYSTERECTOMY WITH BILATERAL SALPINGO OOPHORECTOMY;  Surgeon: Eloy Herring, MD;  Location: [facility];  Service: Gynecology;  Laterality: N/A;   TRANSTHORACIC ECHOCARDIOGRAM  05-19-2006   lvsf normal, ef 55-65%, there was mild flattening of the interventricular septum during diastoli/  RV size at upper limits normal   TUBAL  LIGATION     VULVA /PERINEUM BIOPSY N/A 05/18/2021   Procedure: VULVAR BIOPSY;  Surgeon: Eloy Herring, MD;  Location: [facility];  Service: Gynecology;  Laterality: N/A;   WISDOM TOOTH EXTRACTION  age 65 's    ROS: Review of Systems Negative except as stated above  PHYSICAL EXAM: BP 136/87 (BP Location: Right Arm, Patient Position: Sitting, Cuff Size: Large)   Pulse (!) 105   Temp 98.5 F (36.9 C) (Oral)   Ht 5' 6 (1.676 m)   Wt 235 lb (106.6 kg)   SpO2 98%   BMI 37.93 kg/m   Wt Readings from Last 3 Encounters:  08/06/24 235 lb (106.6 kg)  06/21/24 228 lb 6.4 oz (103.6 kg)  05/07/24 222 lb (100.7 kg)    Physical Exam  General appearance - {:315021} Mental status - {:313008} Neck - {:315327} Chest - {:315033} Heart - {:315510} Extremities - {:315109}      Latest Ref Rng & Units 05/08/2024    3:40 PM 11/09/2023    2:41 PM 11/01/2023   10:33 AM  CMP  Glucose 70 - 99 mg/dL 871  98  898   BUN 6 - 24 mg/dL 14  11  10    Creatinine 0.57 - 1.00 mg/dL 9.16  9.32  9.28   Sodium 134 - 144 mmol/L 140  136  137  Potassium 3.5 - 5.2 mmol/L 4.6  4.4  5.0   Chloride 96 - 106 mmol/L 102  101  98   CO2 20 - 29 mmol/L 20  30  22    Calcium  8.7 - 10.2 mg/dL 89.7  89.8  89.1   Total Protein 6.0 - 8.5 g/dL 7.3  7.9    Total Bilirubin 0.0 - 1.2 mg/dL 0.3  0.3    Alkaline Phos 44 - 121 IU/L 102  98    AST 0 - 40 IU/L 20  17    ALT 0 - 32 IU/L 15  14     Lipid Panel     Component Value Date/Time   CHOL 273 (H) 11/01/2023 1033   TRIG 145 11/01/2023 1033   HDL 70 11/01/2023 1033   CHOLHDL 3.9 11/01/2023 1033   CHOLHDL 4.7 Ratio 06/13/2008 2303   VLDL 32 06/13/2008 2303   LDLCALC 177 (H) 11/01/2023 1033    CBC    Component Value Date/Time   WBC 9.6 05/08/2024 1540   WBC 8.5 11/09/2023 1441   WBC 7.8 08/29/2023 0622   RBC 4.45 05/08/2024 1540   RBC 4.58 11/09/2023 1441   HGB 12.9 05/08/2024 1540   HCT 39.4 05/08/2024 1540   PLT 349 05/08/2024 1540   MCV 89 05/08/2024 1540    MCH 29.0 05/08/2024 1540   MCH 29.3 11/09/2023 1441   MCHC 32.7 05/08/2024 1540   MCHC 33.3 11/09/2023 1441   RDW 14.3 05/08/2024 1540   LYMPHSABS 3.2 11/09/2023 1441   LYMPHSABS 3.9 (H) 11/22/2022 1633   MONOABS 0.7 11/09/2023 1441   EOSABS 0.1 11/09/2023 1441   EOSABS 0.1 11/22/2022 1633   BASOSABS 0.0 11/09/2023 1441   BASOSABS 0.0 11/22/2022 1633    ASSESSMENT AND PLAN:  Assessment and Plan Assessment & Plan      There are no diagnoses linked to this encounter.   Patient was given the opportunity to ask questions.  Patient verbalized understanding of the plan and was able to repeat key elements of the plan.   This documentation was completed using Paediatric nurse.  Any transcriptional errors are unintentional.  No orders of the defined types were placed in this encounter.    Requested Prescriptions    No prescriptions requested or ordered in this encounter    No follow-ups on file.  Barnie Louder, MD, FACP

## 2024-08-06 NOTE — Patient Instructions (Signed)
  VISIT SUMMARY: Today, we discussed your blood pressure management, dietary habits, weight concerns, and urinary issues. We reviewed your current medications and made some recommendations to help manage your conditions more effectively.  YOUR PLAN: -ESSENTIAL HYPERTENSION: Hypertension means high blood pressure. Your blood pressure readings are elevated, especially in the evenings. Continue taking your current medications: lisinopril  10 mg daily, propranolol  20 mg twice daily, and spironolactone  25 mg daily. It's important to limit your sodium intake, especially from foods like pickles and peppers. Please continue to monitor your blood pressure at home using proper technique. You have a follow-up appointment with the cardiologist on December 18th.  -MORBID OBESITY: Morbid obesity means having a very high body weight. Your BMI is 37, and weight loss is challenging due to fatigue and knee problems. We discussed the importance of diet and exercise in managing your weight. You are referred to a nutritionist for dietary counseling. Try to prepare lunch at home and eat closer to the end of your work shift. Reduce portion sizes of high-carbohydrate foods and avoid sugary drinks. Weight loss medications may be considered if your BMI exceeds 40 and sleep apnea is confirmed.  -MIXED URINARY INCONTINENCE: Mixed urinary incontinence means experiencing both urgency and stress incontinence. You have been prescribed VESIcare for overactive bladder. We will explore insurance coverage for incontinence supplies through [employer]. Monitor for any difficulty urinating and stop taking VESIcare if you are unable to urinate. A lab test is scheduled in one week to check your electrolytes and potassium levels.  -SLEEP APNEA (SUSPECTED): Sleep apnea is a condition where breathing repeatedly stops and starts during sleep. You are suspected to have sleep apnea and need to schedule a home sleep study as soon as possible. This study is  important for diagnosis and may impact your eligibility for weight loss medications.  INSTRUCTIONS: Please follow up with the cardiologist on December 18th. Schedule your home sleep study as soon as possible and follow up with the sleep lab to arrange for the sleep study kit. A lab test is scheduled in one week to check your electrolytes and potassium levels.                      Contains text generated by [employer]bridge.                                 Contains text generated by [employer]bridge.

## 2024-08-07 ENCOUNTER — Encounter: Payer: Self-pay | Admitting: Internal Medicine

## 2024-08-08 ENCOUNTER — Other Ambulatory Visit: Payer: Self-pay

## 2024-08-08 ENCOUNTER — Other Ambulatory Visit (HOSPITAL_COMMUNITY): Payer: Self-pay

## 2024-08-13 ENCOUNTER — Other Ambulatory Visit: Payer: Self-pay

## 2024-08-14 ENCOUNTER — Ambulatory Visit

## 2024-08-20 NOTE — Progress Notes (Deleted)
 Cardiology Office Note:    Date:  08/20/2024   ID:  Beckie, Viscardi July 23, 1976, MRN [id number]  PCP:  Vicci Barnie NOVAK, MD  Cardiologist:  Lonni LITTIE Nanas, MD  Electrophysiologist:  None   Referring MD: Vicci Barnie NOVAK, MD   No chief complaint on file.   History of Present Illness:    Sandra Miller is a 48 y.o. female with a hx of BPD, breast cancer, Graves' disease, gastric ulcer, hypertension, former alcohol abuse, tobacco use who presents for follow-up.  She was referred by Dr. Vicci for evaluation of abnormal EKG.  She was admitted July 2024 after having witnessed seizures.  Labs notable for magnesium  0.8.  Neurology was consulted and she was started on Keppra .  Hypomagnesemia and hypokalemia were treated.  She was admitted again in August 2024 with dizziness, found to have prolonged QT interval.  Her Zyprexa  was discontinued and hypomagnesemia/hypokalemia were treated.  She was referred to nephrology for persistent hypomagnesemia and hypokalemia.    Echo 04/2023 EF 55-60%, normal RV function, no significiant valvular disease.  Zio patch x14 days 07/2023 showed no significant arrhythmias.  Calcium  score 0 on 11/05/2023.  Since last clinic visit, she reports she is doing okay.  Does report some shortness of breath when laying down at night.  Has been having some dizziness but denies any syncope.  Continues to have palpitations, recently saw her endocrinologist and decreased her methimazole  dose.  She denies any chest pain or lower extremity edema.    Past Medical History:  Diagnosis Date   Allergy    Arthritis    Bipolar 1 disorder (HCC)    Cancer (HCC)    vulva, and breast   Chronic kidney disease    Clotting disorder    Complication of anesthesia    wakes up during procedures   Depression    GAD (generalized anxiety disorder)    Genital HSV    currently per pt  no break out 03-22-2016    GERD (gastroesophageal reflux disease)    Graves disease     Hiatal hernia    History of cervical dysplasia    2012 laser ablation   History of esophageal dilatation    for dysphasia -- x2 dilated   History of gastric ulcer    History of Helicobacter pylori infection    remote hx   History of hidradenitis suppurativa    gets all over body intermittantly     History of hypertension    no issue since stopped drinking alcohol 2014   History of kidney stones    History of panic attacks    History of radiation therapy 03/16/2020-05/08/2020   vulva  Dr Lynwood Nasuti   History of radiation therapy 09/09/2020-10/23/2020   left chest wall/left SCV   Dr Lynwood Nasuti   Hyperlipidemia    Hypertension    Iron deficiency anemia    Left ureteral stone    OCD (obsessive compulsive disorder)    PONV (postoperative nausea and vomiting)    Pre-diabetes    PTSD (post-traumatic stress disorder)    Recovering alcoholic in remission (HCC)    since 2014   RLS (restless legs syndrome)    Seizures (HCC)    Smokers' cough (HCC)    Thyroid  disease    Urgency of urination    Yeast infection involving the vagina and surrounding area    secondary to taking antibiotic    Past Surgical History:  Procedure Laterality Date   CESAREAN SECTION  1995   w/  Bilateral Tubal Ligation   COLONOSCOPY  last one 08-09-2013   CYSTOSCOPY W/ URETERAL STENT PLACEMENT Left 03/29/2016   Procedure: CYSTOSCOPY WITH STENT REPLACEMENT;  Surgeon: Redell Lynwood Napoleon, MD;  Location: [facility];  Service: Urology;  Laterality: Left;   CYSTOSCOPY WITH RETROGRADE PYELOGRAM, URETEROSCOPY AND STENT PLACEMENT Left 03/08/2016   Procedure: CYSTOSCOPY WITH  LEFT RETROGRADE PYELOGRAM, AND STENT PLACEMENT;  Surgeon: Redell Lynwood Napoleon, MD;  Location: [facility];  Service: Urology;  Laterality: Left;   CYSTOSCOPY/RETROGRADE/URETEROSCOPY/STONE EXTRACTION WITH BASKET Left 03/29/2016   Procedure: CYSTOSCOPY/RETROGRADE/URETEROSCOPY/STONE EXTRACTION WITH BASKET;  Surgeon: Redell Lynwood Napoleon, MD;   Location: [facility];  Service: Urology;  Laterality: Left;   ENDOMETRIAL ABLATION W/ NOVASURE  04-01-2010   ESOPHAGOGASTRODUODENOSCOPY  last one 08-09-2013   KNEE ARTHROSCOPY Left as teen   LASER ABLATION OF THE CERVIX  2012 approx   MASTECTOMY WITH AXILLARY LYMPH NODE DISSECTION Left 07/27/2020   Procedure: LEFT MASTECTOMY WITH LEFT RADIOACTIVE SEED GUIDED TARGETED AXILLARY LYMPH NODE DISSECTION;  Surgeon: Ethyl Lenis, MD;  Location: [facility];  Service: General;  Laterality: Left;   PORT-A-CATH REMOVAL Right 05/18/2021   Procedure: REMOVAL PORT-A-CATH;  Surgeon: Belinda Cough, MD;  Location: [facility];  Service: General;  Laterality: Right;   PORTACATH PLACEMENT Right 03/06/2020   Procedure: INSERTION PORT-A-CATH WITH ULTRASOUND GUIDANCE;  Surgeon: Ethyl Lenis, MD;  Location: [facility];  Service: General;  Laterality: Right;   ROBOTIC ASSISTED TOTAL HYSTERECTOMY WITH BILATERAL SALPINGO OOPHERECTOMY N/A 05/18/2021   Procedure: XI ROBOTIC ASSISTED TOTAL HYSTERECTOMY WITH BILATERAL SALPINGO OOPHORECTOMY;  Surgeon: Eloy Herring, MD;  Location: [facility];  Service: Gynecology;  Laterality: N/A;   TRANSTHORACIC ECHOCARDIOGRAM  05-19-2006   lvsf normal, ef 55-65%, there was mild flattening of the interventricular septum during diastoli/  RV size at upper limits normal   TUBAL LIGATION     VULVA /PERINEUM BIOPSY N/A 05/18/2021   Procedure: VULVAR BIOPSY;  Surgeon: Eloy Herring, MD;  Location: [facility];  Service: Gynecology;  Laterality: N/A;   WISDOM TOOTH EXTRACTION  age 48 's    Current Medications: No outpatient medications have been marked as taking for the 08/22/24 encounter (Appointment) with Kate Lonni CROME, MD.     Allergies:   Dilaudid  [hydromorphone  hcl], Fentanyl , Zyprexa  [olanzapine ], Aspirin, Depakote [divalproex sodium], and Minocycline   Social History   Socioeconomic History   Marital status: Single    Spouse name: Not on file   Number of children:  Not on file   Years of education: Not on file   Highest education level: Associate degree: academic program  Occupational History   Not on file  Tobacco Use   Smoking status: Every Day    Current packs/day: 0.50    Types: Cigarettes   Smokeless tobacco: Never   Tobacco comments:    Recently started a smoking cessation class. Quit 03/31/23  Vaping Use   Vaping status: Never Used  Substance and Sexual Activity   Alcohol use: Not Currently   Drug use: Not Currently    Types: Marijuana    Comment: none in about 1 year   Sexual activity: Yes    Partners: Male    Birth control/protection: Surgical  Other Topics Concern   Not on file  Social History Narrative   Former [facility] patient.      Was on disability at one point.   Return to the workforce.  40 hours a week at [employer], 10 hours  a week on the weekends at [employer] in [place], [employer] at night 10 to 12 hours a week.      Has grown children, she lives alone with a pet, continues to smoke no alcohol or drug use at this time      History of EtOH abuse and THC use.         Social Drivers of Health   Tobacco Use: High Risk (08/07/2024)   Patient History    Smoking Tobacco Use: Every Day    Smokeless Tobacco Use: Never    Passive Exposure: Not on file  Financial Resource Strain: Low Risk (05/07/2024)   Overall Financial Resource Strain (CARDIA)    Difficulty of Paying Living Expenses: Not hard at all  Food Insecurity: No Food Insecurity (05/07/2024)   Epic    Worried About Radiation Protection Practitioner of Food in the Last Year: Never true    Ran Out of Food in the Last Year: Never true  Transportation Needs: No Transportation Needs (05/07/2024)   Epic    Lack of Transportation (Medical): No    Lack of Transportation (Non-Medical): No  Physical Activity: Inactive (05/07/2024)   Exercise Vital Sign    Days of Exercise per Week: 0 days    Minutes of Exercise per Session: 0 min  Stress: Stress Concern Present (05/07/2024)    [employer] of Occupational Health - Occupational Stress Questionnaire    Feeling of Stress: Very much  Social Connections: Socially Isolated (05/07/2024)   Social Connection and Isolation Panel    Frequency of Communication with Friends and Family: More than three times a week    Frequency of Social Gatherings with Friends and Family: Once a week    Attends Religious Services: Never    Database Administrator or Organizations: No    Attends Banker Meetings: Never    Marital Status: Never married  Depression (PHQ2-9): High Risk (08/06/2024)   Depression (PHQ2-9)    PHQ-2 Score: 17  Alcohol Screen: Low Risk (05/07/2024)   Alcohol Screen    Last Alcohol Screening Score (AUDIT): 0  Housing: Low Risk (05/07/2024)   Epic    Unable to Pay for Housing in the Last Year: No    Number of Times Moved in the Last Year: 0    Homeless in the Last Year: No  Utilities: Not At Risk (05/07/2024)   Epic    Threatened with loss of utilities: No  Health Literacy: Adequate Health Literacy (05/07/2024)   B1300 Health Literacy    Frequency of need for help with medical instructions: Never     Family History: The patient's family history includes ADD / ADHD in her brother; Alcohol abuse in her brother and father; Anxiety disorder in her maternal aunt and mother; Breast cancer in her cousin and cousin; Cancer in her cousin and paternal grandfather; Cirrhosis in her cousin; Colon polyps in her brother; Depression in her maternal aunt and mother; Diabetes in her maternal grandfather and paternal grandmother; Drug abuse in her brother and brother; Heart disease in her father and mother; Kidney disease in her maternal uncle; Lung cancer in her cousin and father; Lung cancer (age of onset: 30) in her paternal uncle; Throat cancer in her cousin. There is no history of Colon cancer, Esophageal cancer, Stomach cancer, or Rectal cancer.  ROS:   Please see the history of present illness.     All other systems  reviewed and are negative.  EKGs/Labs/Other Studies Reviewed:  The following studies were reviewed today:   EKG:   07/06/2023: Normal sinus rhythm, rate 88, nonspecific T wave flattening, QTc 546 11/01/2023: Normal sinus rhythm, rate 94, QTc 460  Recent Labs: 11/01/2023: Magnesium  1.8 05/08/2024: ALT 15; BUN 14; Creatinine, Ser 0.83; Hemoglobin 12.9; Platelets 349; Potassium 4.6; Sodium 140; TSH 1.090  Recent Lipid Panel    Component Value Date/Time   CHOL 273 (H) 11/01/2023 1033   TRIG 145 11/01/2023 1033   HDL 70 11/01/2023 1033   CHOLHDL 3.9 11/01/2023 1033   CHOLHDL 4.7 Ratio 06/13/2008 2303   VLDL 32 06/13/2008 2303   LDLCALC 177 (H) 11/01/2023 1033    Physical Exam:    VS:  There were no vitals taken for this visit.    Wt Readings from Last 3 Encounters:  08/06/24 235 lb (106.6 kg)  06/21/24 228 lb 6.4 oz (103.6 kg)  05/07/24 222 lb (100.7 kg)     GEN:  Well nourished, well developed in no acute distress HEENT: Normal NECK: No JVD; No carotid bruits LYMPHATICS: No lymphadenopathy CARDIAC: RRR, no murmurs, rubs, gallops RESPIRATORY:  Clear to auscultation without rales, wheezing or rhonchi  ABDOMEN: Soft, non-tender, non-distended MUSCULOSKELETAL:  No edema; No deformity  SKIN: Warm and dry NEUROLOGIC:  Alert and oriented x 3 PSYCHIATRIC:  Normal affect   ASSESSMENT:    No diagnosis found.   PLAN:    Prolonged QT: During admission 04/2023 noted to have prolonged QTc, her Zyprexa  was discontinued.  She also had significant hypokalemia and hypomagnesemia, which were repleted, and she was referred to nephrology.  QTc improved to at clinic visit 05/2024.  Avoid QT prolonging agents   Palpitations: Zio patch x14 days 07/2023 showed no significant arrhythmias. -Suspect related to her thyroid  disease, reports recently decreased methimazole  dose***   Hypertension: On lisinopril  10 mg daily and spironolactone  12.5 mg daily and propranolol  20 mg twice daily.   Appears controlled.  Check BMET, magnesium ***   Hyperlipidemia: LDL 177 on 11/01/2023.  10-year ASCVD risk score 3%.  Calcium  score 0 on 11/2023.  Does not meet indication for statin at this time, dietary changes recommended***     RTC in 1 year***   Medication Adjustments/Labs and Tests Ordered: Current medicines are reviewed at length with the patient today.  Concerns regarding medicines are outlined above.  No orders of the defined types were placed in this encounter.  No orders of the defined types were placed in this encounter.   There are no Patient Instructions on file for this visit.   Signed, Lonni LITTIE Nanas, MD  08/20/2024 7:08 PM    [facility]

## 2024-08-22 ENCOUNTER — Other Ambulatory Visit (HOSPITAL_COMMUNITY): Payer: Self-pay

## 2024-08-22 ENCOUNTER — Other Ambulatory Visit: Payer: Self-pay

## 2024-08-22 ENCOUNTER — Ambulatory Visit: Admitting: Cardiology

## 2024-08-22 ENCOUNTER — Other Ambulatory Visit: Payer: Self-pay | Admitting: Internal Medicine

## 2024-08-22 MED ORDER — PROMETHAZINE HCL 12.5 MG PO TABS
12.5000 mg | ORAL_TABLET | Freq: Three times a day (TID) | ORAL | 0 refills | Status: AC | PRN
  Filled 2024-08-22 – 2024-09-07 (×3): qty 30, 10d supply, fill #0

## 2024-08-24 ENCOUNTER — Other Ambulatory Visit (HOSPITAL_COMMUNITY): Payer: Self-pay

## 2024-08-26 ENCOUNTER — Telehealth: Payer: Self-pay | Admitting: Internal Medicine

## 2024-08-26 ENCOUNTER — Other Ambulatory Visit (HOSPITAL_COMMUNITY): Payer: Self-pay

## 2024-08-26 DIAGNOSIS — R0683 Snoring: Secondary | ICD-10-CM

## 2024-08-26 NOTE — Telephone Encounter (Signed)
 Copied from CRM [id number]. Topic: Clinical - Request for Lab/Test Order >> Aug 26, 2024  2:43 PM Darshell M wrote:  Reason for CRM: Per sleep, provider needs to issue active order and not a referral. Patient CB#[phone].

## 2024-08-27 ENCOUNTER — Other Ambulatory Visit: Payer: Self-pay

## 2024-08-27 ENCOUNTER — Other Ambulatory Visit (HOSPITAL_COMMUNITY): Payer: Self-pay

## 2024-08-27 NOTE — Telephone Encounter (Signed)
 Order has been changed

## 2024-08-27 NOTE — Addendum Note (Signed)
 Addended by: Debbrah Sampedro on: 08/27/2024 12:05 PM   Modules accepted: Orders

## 2024-08-27 NOTE — Telephone Encounter (Signed)
 Called & spoke to the sleep center and they clarified that the order date needs to be changed. It is currently placed as the same day that it was placed therefore causing for the order to be marked at finalized. Please change the date to Future for the sleep center to move forward with the order.

## 2024-08-27 NOTE — Telephone Encounter (Signed)
 Can you please check with the sleep center as I am not sure what they need exactly?Thank you.

## 2024-08-28 ENCOUNTER — Other Ambulatory Visit: Payer: Self-pay

## 2024-08-28 ENCOUNTER — Other Ambulatory Visit (HOSPITAL_COMMUNITY): Payer: Self-pay

## 2024-08-30 NOTE — Telephone Encounter (Signed)
 Called and spoke to [facility] at [facility] and the representative confirmed that the order has been placed correctly. They will start working on the home sleep test. No further assistance needed at this time.

## 2024-09-07 ENCOUNTER — Encounter: Payer: Self-pay | Admitting: Hematology and Oncology

## 2024-09-07 ENCOUNTER — Other Ambulatory Visit (HOSPITAL_COMMUNITY): Payer: Self-pay

## 2024-09-09 ENCOUNTER — Other Ambulatory Visit (HOSPITAL_COMMUNITY): Payer: Self-pay

## 2024-09-09 ENCOUNTER — Encounter: Payer: Self-pay | Admitting: Hematology and Oncology

## 2024-09-09 ENCOUNTER — Other Ambulatory Visit: Payer: Self-pay

## 2024-09-12 ENCOUNTER — Other Ambulatory Visit (HOSPITAL_COMMUNITY): Payer: Self-pay | Admitting: *Deleted

## 2024-09-12 ENCOUNTER — Other Ambulatory Visit (HOSPITAL_COMMUNITY): Payer: Self-pay

## 2024-09-12 ENCOUNTER — Other Ambulatory Visit: Payer: Self-pay

## 2024-09-12 ENCOUNTER — Encounter: Payer: Self-pay | Admitting: Hematology and Oncology

## 2024-09-12 ENCOUNTER — Telehealth (HOSPITAL_COMMUNITY): Payer: Self-pay

## 2024-09-12 DIAGNOSIS — F319 Bipolar disorder, unspecified: Secondary | ICD-10-CM

## 2024-09-12 MED ORDER — REXULTI 0.5 MG PO TABS: 0.5000 mg | ORAL_TABLET | Freq: Two times a day (BID) | ORAL | Status: DC

## 2024-09-13 ENCOUNTER — Telehealth: Payer: Self-pay

## 2024-09-13 ENCOUNTER — Encounter: Payer: Self-pay | Admitting: Internal Medicine

## 2024-09-13 ENCOUNTER — Other Ambulatory Visit (HOSPITAL_COMMUNITY): Payer: Self-pay

## 2024-09-13 ENCOUNTER — Encounter: Payer: Self-pay | Admitting: Hematology and Oncology

## 2024-09-13 NOTE — Telephone Encounter (Signed)
 Pharmacy Patient Advocate Encounter  Received notification from [employer] that Prior Authorization for Ajovy  has been APPROVED from 09/13/2024 to 03/12/25. Ran test claim, Copay is $24.98. This test claim was processed through [facility]- copay amounts may vary at other pharmacies due to pharmacy/plan contracts, or as the patient moves through the different stages of their insurance plan.   PA #/Case ID/Reference #: [id number]

## 2024-09-13 NOTE — Telephone Encounter (Signed)
 Pharmacy Patient Advocate Encounter   Received notification from [facility] that prior authorization for Ajovyis required/requested.   Insurance verification completed.   The patient is insured through [facility].   Per test claim: PA required; PA submitted to above mentioned insurance via Latent Key/confirmation #/EOC [id number] Status is pending

## 2024-09-13 NOTE — Telephone Encounter (Signed)
 PA request has been Submitted. New Encounter has been or will be created for follow up. For additional info see Pharmacy Prior Auth telephone encounter from 09/13/2024.

## 2024-09-16 ENCOUNTER — Other Ambulatory Visit (HOSPITAL_COMMUNITY): Payer: Self-pay

## 2024-09-16 ENCOUNTER — Telehealth (HOSPITAL_COMMUNITY): Admitting: Psychiatry

## 2024-09-16 ENCOUNTER — Other Ambulatory Visit: Payer: Self-pay

## 2024-09-17 ENCOUNTER — Other Ambulatory Visit (HOSPITAL_COMMUNITY): Payer: Self-pay

## 2024-09-18 ENCOUNTER — Other Ambulatory Visit: Payer: Self-pay

## 2024-09-18 ENCOUNTER — Other Ambulatory Visit (HOSPITAL_COMMUNITY): Payer: Self-pay

## 2024-09-19 ENCOUNTER — Other Ambulatory Visit (HOSPITAL_COMMUNITY): Payer: Self-pay

## 2024-09-20 ENCOUNTER — Other Ambulatory Visit: Payer: Self-pay

## 2024-09-26 ENCOUNTER — Encounter (HOSPITAL_COMMUNITY): Payer: Self-pay | Admitting: Psychiatry

## 2024-09-26 ENCOUNTER — Telehealth (HOSPITAL_COMMUNITY): Admitting: Psychiatry

## 2024-09-26 VITALS — Wt 235.0 lb

## 2024-09-26 DIAGNOSIS — F411 Generalized anxiety disorder: Secondary | ICD-10-CM | POA: Diagnosis not present

## 2024-09-26 DIAGNOSIS — F5101 Primary insomnia: Secondary | ICD-10-CM

## 2024-09-26 DIAGNOSIS — F410 Panic disorder [episodic paroxysmal anxiety] without agoraphobia: Secondary | ICD-10-CM | POA: Diagnosis not present

## 2024-09-26 DIAGNOSIS — F319 Bipolar disorder, unspecified: Secondary | ICD-10-CM

## 2024-09-26 MED ORDER — LORAZEPAM 1 MG PO TABS
1.0000 mg | ORAL_TABLET | Freq: Three times a day (TID) | ORAL | 2 refills | Status: AC | PRN
  Filled 2024-09-26: qty 90, 30d supply, fill #0

## 2024-09-26 MED ORDER — FLUOXETINE HCL 20 MG PO CAPS
20.0000 mg | ORAL_CAPSULE | Freq: Every day | ORAL | 2 refills | Status: AC
  Filled 2024-09-26: qty 30, 30d supply, fill #0
  Filled 2024-10-07: qty 90, 90d supply, fill #0

## 2024-09-26 MED ORDER — BREXPIPRAZOLE 1 MG PO TABS
1.0000 mg | ORAL_TABLET | Freq: Every day | ORAL | 2 refills | Status: AC
  Filled 2024-09-26: qty 90, 90d supply, fill #0

## 2024-09-26 MED ORDER — PROPRANOLOL HCL 20 MG PO TABS
20.0000 mg | ORAL_TABLET | Freq: Two times a day (BID) | ORAL | 2 refills | Status: AC
  Filled 2024-09-26: qty 60, 30d supply, fill #0
  Filled 2024-10-07: qty 180, 90d supply, fill #0

## 2024-09-26 NOTE — Progress Notes (Signed)
 " [facility] MD Virtual Progress Note   Patient Location: In Car Provider Location: Office  I connect with patient by telephone and verified that I am speaking with correct person by using two identifiers. I discussed the limitations of evaluation and management by telemedicine and the availability of in person appointments. I also discussed with the patient that there may be a patient responsible charge related to this service. The patient expressed understanding and agreed to proceed.  Sandra Miller [id number] 49 y.o.  09/26/2024 3:27 PM  History of Present Illness:  Patient is evaluated by phone session.  She is driving and cannot do video.  She reported things were going very well since the last visit because she was able to get a job at [employer].  However earlier this month she had COVID and she still not recovered from fatigue and tiredness.  She feels very exhausted.  She still feel anxious nervous but happy that she is at [facility] emergency room working.  She is taking Rexulti  which we increased on the last visit.  She was out of the medication and needed sample.  She came to our office to pick up the samples.  So far no major concern from the medication.  She has not able to contact with the cardiology but going to have appointment very soon.  She is taking the Prozac , Ativan , propranolol .  She has mild tremors but that does not interfere in her daily activities.  She had a good Christmas and holidays with the family.  Her sleep is fair.  She denies any hallucination, paranoia, agitation.  Her blood pressure still fluctuates.  She is hoping to see the cardiology very soon.  She denies any active or passive suicidal thoughts or homicidal thoughts.  She like to keep the current medication.  Past Psychiatric History: H/O bipolar disorder, alcohol dependence and marijuana abuse.  H/O at least 6 inpatient. Tried lithium, Seroquel, Lamictal , Paxil, Celexa, Abilify,  Geodon , Klonopin, Depakote, Neurontin  and Cymbalta.  Trintellix  caused nausea. No H/o history of suicidal attempt.  Olanzapine  helped but it was discontinued in July 2024 due to abnormal EKG. Prozac  helped for while until stopped working.  Topamax  stopped due to inefficiency.   Past Medical History:  Diagnosis Date   Allergy    Arthritis    Bipolar 1 disorder (HCC)    Cancer (HCC)    vulva, and breast   Chronic kidney disease    Clotting disorder    Complication of anesthesia    wakes up during procedures   Depression    GAD (generalized anxiety disorder)    Genital HSV    currently per pt  no break out 03-22-2016    GERD (gastroesophageal reflux disease)    Graves disease    Hiatal hernia    History of cervical dysplasia    2012 laser ablation   History of esophageal dilatation    for dysphasia -- x2 dilated   History of gastric ulcer    History of Helicobacter pylori infection    remote hx   History of hidradenitis suppurativa    gets all over body intermittantly     History of hypertension    no issue since stopped drinking alcohol 2014   History of kidney stones    History of panic attacks    History of radiation therapy 03/16/2020-05/08/2020   vulva  Dr Lynwood Nasuti   History of radiation therapy 09/09/2020-10/23/2020   left chest wall/left SCV  Dr Lynwood Nasuti   Hyperlipidemia    Hypertension    Iron deficiency anemia    Left ureteral stone    OCD (obsessive compulsive disorder)    PONV (postoperative nausea and vomiting)    Pre-diabetes    PTSD (post-traumatic stress disorder)    Recovering alcoholic in remission (HCC)    since 2014   RLS (restless legs syndrome)    Seizures (HCC)    Smokers' cough (HCC)    Thyroid  disease    Urgency of urination    Yeast infection involving the vagina and surrounding area    secondary to taking antibiotic    Outpatient Encounter Medications as of 09/26/2024  Medication Sig   acetaminophen  (TYLENOL ) 500 MG tablet Take 500  mg by mouth every 6 (six) hours as needed.   albuterol  (VENTOLIN  HFA) 108 (90 Base) MCG/ACT inhaler Inhale 1-2 puffs into the lungs every 6 (six) hours as needed.   benzonatate  (TESSALON ) 200 MG capsule Take 1 capsule (200 mg total) by mouth 3 (three) times daily as needed. (Patient not taking: Reported on 08/06/2024)   Brexpiprazole  (REXULTI ) 0.5 MG TABS Take 1 tablet (0.5 mg total) by mouth 2 (two) times daily.   famotidine  (PEPCID ) 40 MG tablet Take 1 tablet (40 mg total) by mouth daily.   FLUoxetine  (PROZAC ) 20 MG capsule Take 1 capsule (20 mg total) by mouth daily.   Fremanezumab -vfrm (AJOVY ) 225 MG/1.5ML SOAJ Inject 225 mg into the skin every 30 (thirty) days.   gabapentin  (NEURONTIN ) 300 MG capsule Take 1 capsule (300 mg) by mouth daily.   LamoTRIgine  (LAMICTAL  XR) 200 MG TB24 24 hour tablet Take 1 tablet (200 mg total) by mouth at bedtime.   letrozole  (FEMARA ) 2.5 MG tablet Take 1 tablet by mouth daily.   lisinopril  (ZESTRIL ) 10 MG tablet Take 1 tablet (10 mg total) by mouth daily.   loratadine (CLARITIN REDITABS) 10 MG dissolvable tablet Take 10 mg by mouth daily. OTC   LORazepam  (ATIVAN ) 1 MG tablet Take 1 tablet (1 mg total) by mouth 3 (three) times daily as needed for anxiety   Magnesium  400 MG TABS Take 800 mg by mouth 2 (two) times daily.   Multiple Vitamin (MULTIVITAMIN WITH MINERALS) TABS tablet Take 1 tablet by mouth daily.   omeprazole  (PRILOSEC) 40 MG capsule Take 1 capsule (40 mg total) by mouth daily before breakfast.   potassium chloride  SA (KLOR-CON  M) 20 MEQ tablet Take 1 tablet (20 mEq total) by mouth daily.   promethazine  (PHENERGAN ) 12.5 MG tablet Take 1 tablet (12.5 mg total) by mouth every 8 (eight) hours as needed for nausea or vomiting.   propranolol  (INDERAL ) 20 MG tablet Take 1 tablet (20 mg total) by mouth 2 (two) times daily.   solifenacin  (VESICARE ) 5 MG tablet Take 1 tablet (5 mg total) by mouth daily.   spironolactone  (ALDACTONE ) 25 MG tablet Take 0.5 tablets  (12.5 mg total) by mouth daily.   Ubrogepant  (UBRELVY ) 50 MG TABS Take 50 mg as needed for migraine, a second dose may be taken at least 2 hours after the initial dose. DO NOT EXCEED 2 doses in 24 hours.   No facility-administered encounter medications on file as of 09/26/2024.    Recent Results (from the past 2160 hours)  POC Covid19/Flu A&B Antigen     Status: None   Collection Time: 07/02/24 11:44 AM  Result Value Ref Range   Influenza A Antigen, POC Negative Negative   Influenza B Antigen, POC Negative Negative  Covid Antigen, POC Negative Negative  POCT rapid strep A     Status: None   Collection Time: 07/02/24 11:44 AM  Result Value Ref Range   Rapid Strep A Screen Negative Negative     Psychiatric Specialty Exam: Physical Exam  Review of Systems  Weight 235 lb (106.6 kg).There is no height or weight on file to calculate BMI.  General Appearance: NA  Eye Contact:  NA  Speech:  Slow  Volume:  Decreased  Mood:  tired  Affect:  NA  Thought Process:  Goal Directed  Orientation:  Full (Time, Place, and Person)  Thought Content:  Logical  Suicidal Thoughts:  No  Homicidal Thoughts:  No  Memory:  Immediate;   Good Recent;   Good Remote;   Good  Judgement:  Intact  Insight:  Present  Psychomotor Activity:  Decreased  Concentration:  Concentration: Good and Attention Span: Good  Recall:  Good  Fund of Knowledge:  Good  Language:  Good  Akathisia:  No  Handed:  Right  AIMS (if indicated):     Assets:  Communication Skills Desire for Improvement Housing Social Support Talents/Skills Transportation  ADL's:  Intact  Cognition:  WNL  Sleep:  fair       08/06/2024    4:42 PM 05/07/2024    3:47 PM 12/22/2023   11:09 AM 10/02/2023    2:47 PM 09/25/2023    9:52 AM  Depression screen PHQ 2/9  Decreased Interest 3 3 1 3 3   Down, Depressed, Hopeless 2 2 1 3 3   PHQ - 2 Score 5 5 2 6 6   Altered sleeping 3 3 1 3 3   Tired, decreased energy 3 3 1 3 3   Change in appetite 2  3 0 3 3  Feeling bad or failure about yourself  1 3 0 3 3  Trouble concentrating 3 3 0 3 3  Moving slowly or fidgety/restless 0 1 0 0 3  Suicidal thoughts 0 0 0 1 3  PHQ-9 Score 17 21  4  22  27    Difficult doing work/chores Extremely dIfficult Very difficult Not difficult at all Extremely dIfficult Extremely dIfficult     Data saved with a previous flowsheet row definition    Assessment/Plan: Bipolar 1 disorder (HCC) - Plan: FLUoxetine  (PROZAC ) 20 MG capsule, propranolol  (INDERAL ) 20 MG tablet, brexpiprazole  (REXULTI ) 1 MG TABS tablet  GAD (generalized anxiety disorder) - Plan: FLUoxetine  (PROZAC ) 20 MG capsule, LORazepam  (ATIVAN ) 1 MG tablet, propranolol  (INDERAL ) 20 MG tablet, brexpiprazole  (REXULTI ) 1 MG TABS tablet  Primary insomnia - Plan: FLUoxetine  (PROZAC ) 20 MG capsule, LORazepam  (ATIVAN ) 1 MG tablet, brexpiprazole  (REXULTI ) 1 MG TABS tablet  Panic attacks - Plan: propranolol  (INDERAL ) 20 MG tablet, brexpiprazole  (REXULTI ) 1 MG TABS tablet  Patient is 49 year old employed female with a history of bipolar, generalized anxiety disorder, insomnia and panic attacks.  She had other noticeable general health problem including Graves' disease, hypothyroidism, migraine and vulvar cancer.  Review current medication.  Overall doing better since Rexulti  increased.  Also feeling better since able to get a job at desired location.  Does not want to change the medication but I emphasized should consult cardiology about antihypertensive medication.  She is taking propranolol  20 mg twice a day but is still her blood pressure fluctuates.  Continue Rexulti  1 mg daily, Prozac  20 mg daily, Ativan  1 mg 3 times a day.  Patient has a prolonged history of QT interval.  Recommend to call back if she has  any question or any concern.  Follow-up in 3 months in person.   Follow Up Instructions:     I discussed the assessment and treatment plan with the patient. The patient was provided an opportunity to ask  questions and all were answered. The patient agreed with the plan and demonstrated an understanding of the instructions.   The patient was advised to call back or seek an in-person evaluation if the symptoms worsen or if the condition fails to improve as anticipated.    Collaboration of Care: Other provider involved in patient's care AEB notes are available in epic to review  Patient/Guardian was advised Release of Information must be obtained prior to any record release in order to collaborate their care with an outside provider. Patient/Guardian was advised if they have not already done so to contact the registration department to sign all necessary forms in order for us  to release information regarding their care.   Consent: Patient/Guardian gives verbal consent for treatment and assignment of benefits for services provided during this visit. Patient/Guardian expressed understanding and agreed to proceed.     Total encounter time 18 minutes which includes, chart reviewed, obtain history, examination, reviewing and communication results, placing orders, care coordination and documentation clinical information in EHR during this encounter.   Note: This document was prepared by [employer] voice dictation technology and any errors that results from this process are unintentional.    Leni ONEIDA Client, MD 09/26/2024   "

## 2024-09-27 ENCOUNTER — Other Ambulatory Visit (HOSPITAL_BASED_OUTPATIENT_CLINIC_OR_DEPARTMENT_OTHER): Payer: Self-pay

## 2024-09-27 ENCOUNTER — Other Ambulatory Visit (HOSPITAL_COMMUNITY): Payer: Self-pay

## 2024-09-27 ENCOUNTER — Other Ambulatory Visit: Payer: Self-pay

## 2024-09-30 ENCOUNTER — Other Ambulatory Visit (HOSPITAL_COMMUNITY): Payer: Self-pay

## 2024-09-30 ENCOUNTER — Other Ambulatory Visit: Payer: Self-pay

## 2024-10-01 ENCOUNTER — Other Ambulatory Visit (HOSPITAL_COMMUNITY): Payer: Self-pay

## 2024-10-01 ENCOUNTER — Other Ambulatory Visit: Payer: Self-pay

## 2024-10-05 ENCOUNTER — Other Ambulatory Visit (HOSPITAL_COMMUNITY): Payer: Self-pay

## 2024-10-05 ENCOUNTER — Other Ambulatory Visit: Payer: Self-pay | Admitting: Physician Assistant

## 2024-10-05 DIAGNOSIS — R1013 Epigastric pain: Secondary | ICD-10-CM

## 2024-10-06 ENCOUNTER — Other Ambulatory Visit: Payer: Self-pay

## 2024-10-07 ENCOUNTER — Other Ambulatory Visit (HOSPITAL_COMMUNITY): Payer: Self-pay

## 2024-10-07 ENCOUNTER — Encounter: Payer: Self-pay | Admitting: Internal Medicine

## 2024-10-07 ENCOUNTER — Other Ambulatory Visit: Payer: Self-pay

## 2024-10-08 ENCOUNTER — Other Ambulatory Visit: Payer: Self-pay | Admitting: Physician Assistant

## 2024-10-08 ENCOUNTER — Other Ambulatory Visit: Payer: Self-pay | Admitting: Internal Medicine

## 2024-10-08 ENCOUNTER — Other Ambulatory Visit (HOSPITAL_COMMUNITY): Payer: Self-pay

## 2024-10-08 ENCOUNTER — Other Ambulatory Visit: Payer: Self-pay

## 2024-10-08 DIAGNOSIS — R1013 Epigastric pain: Secondary | ICD-10-CM

## 2024-10-08 DIAGNOSIS — I10 Essential (primary) hypertension: Secondary | ICD-10-CM

## 2024-10-08 MED ORDER — GABAPENTIN 300 MG PO CAPS
300.0000 mg | ORAL_CAPSULE | Freq: Every day | ORAL | 1 refills | Status: DC
  Filled 2024-10-08 (×2): qty 30, 30d supply, fill #0

## 2024-10-08 MED ORDER — POTASSIUM CHLORIDE CRYS ER 20 MEQ PO TBCR
20.0000 meq | EXTENDED_RELEASE_TABLET | Freq: Every day | ORAL | 2 refills | Status: AC
  Filled 2024-10-08: qty 90, 90d supply, fill #0

## 2024-10-08 MED ORDER — GABAPENTIN 300 MG PO CAPS
300.0000 mg | ORAL_CAPSULE | Freq: Every day | ORAL | 1 refills | Status: AC
  Filled 2024-10-08: qty 90, 90d supply, fill #0

## 2024-10-08 MED ORDER — LISINOPRIL 10 MG PO TABS
10.0000 mg | ORAL_TABLET | Freq: Every day | ORAL | 2 refills | Status: AC
  Filled 2024-10-08: qty 90, 90d supply, fill #0

## 2024-10-08 MED ORDER — GABAPENTIN 300 MG PO CAPS
300.0000 mg | ORAL_CAPSULE | Freq: Every day | ORAL | 1 refills | Status: DC
  Filled 2024-10-08: qty 30, 30d supply, fill #0

## 2024-10-09 ENCOUNTER — Other Ambulatory Visit: Payer: Self-pay

## 2024-10-09 ENCOUNTER — Telehealth: Payer: Self-pay | Admitting: Internal Medicine

## 2024-10-09 ENCOUNTER — Other Ambulatory Visit (HOSPITAL_COMMUNITY): Payer: Self-pay

## 2024-10-09 DIAGNOSIS — G4719 Other hypersomnia: Secondary | ICD-10-CM

## 2024-10-09 DIAGNOSIS — F5104 Psychophysiologic insomnia: Secondary | ICD-10-CM

## 2024-10-09 DIAGNOSIS — R0683 Snoring: Secondary | ICD-10-CM

## 2024-10-09 NOTE — Telephone Encounter (Signed)
 Copied from CRM [id number]. Topic: Clinical - Medical Advice >> Oct 09, 2024  2:08 PM   Kevelyn M wrote:  Reason for CRM: She received a message from [facility] disorder center stating they need the active order or PA sent so that it can come through their que.  Call back #[phone]

## 2024-10-09 NOTE — Telephone Encounter (Signed)
 I need clarification on this. I submitted the order in 06/2024. They then came back in Dec stating that they need new order that Dr. Delbert submitted on my behalf 08/27/24. According to your telephone message dated 08/30/24 you then called them to confirm the order was entered correctly and they said it was. So what is it that they are needing me to do now?

## 2024-10-10 ENCOUNTER — Other Ambulatory Visit: Payer: Self-pay

## 2024-10-10 NOTE — Telephone Encounter (Signed)
 Okay. I have updated order. Please see if they receive it on their end. Thanks.

## 2024-10-10 NOTE — Addendum Note (Signed)
 Addended by: VICCI SOBER B on: 10/10/2024 12:38 PM   Modules accepted: Orders

## 2024-10-11 ENCOUNTER — Other Ambulatory Visit (HOSPITAL_COMMUNITY): Payer: Self-pay

## 2024-10-14 ENCOUNTER — Ambulatory Visit: Payer: PRIVATE HEALTH INSURANCE | Admitting: Adult Health

## 2024-11-11 ENCOUNTER — Inpatient Hospital Stay: Admitting: Hematology and Oncology

## 2024-11-29 ENCOUNTER — Ambulatory Visit: Admitting: Internal Medicine

## 2024-12-05 ENCOUNTER — Ambulatory Visit: Admitting: Internal Medicine

## 2024-12-16 ENCOUNTER — Ambulatory Visit: Admitting: Radiation Oncology
